# Patient Record
Sex: Female | Born: 1940
Health system: Southern US, Community
[De-identification: ages and names within clinical notes are randomized; demographics above are authoritative.]

## PROBLEM LIST (undated history)

## (undated) DIAGNOSIS — I10 Essential (primary) hypertension: Secondary | ICD-10-CM

## (undated) DIAGNOSIS — Z9889 Other specified postprocedural states: Secondary | ICD-10-CM

## (undated) DIAGNOSIS — I499 Cardiac arrhythmia, unspecified: Secondary | ICD-10-CM

## (undated) DIAGNOSIS — N3000 Acute cystitis without hematuria: Secondary | ICD-10-CM

## (undated) DIAGNOSIS — R509 Fever, unspecified: Secondary | ICD-10-CM

## (undated) DIAGNOSIS — J188 Other pneumonia, unspecified organism: Secondary | ICD-10-CM

## (undated) DIAGNOSIS — E785 Hyperlipidemia, unspecified: Secondary | ICD-10-CM

## (undated) DIAGNOSIS — M1990 Unspecified osteoarthritis, unspecified site: Secondary | ICD-10-CM

## (undated) DIAGNOSIS — R112 Nausea with vomiting, unspecified: Secondary | ICD-10-CM

## (undated) DIAGNOSIS — R7303 Prediabetes: Secondary | ICD-10-CM

## (undated) DIAGNOSIS — G709 Myoneural disorder, unspecified: Secondary | ICD-10-CM

## (undated) DIAGNOSIS — I639 Cerebral infarction, unspecified: Secondary | ICD-10-CM

## (undated) DIAGNOSIS — J189 Pneumonia, unspecified organism: Secondary | ICD-10-CM

## (undated) DIAGNOSIS — I5031 Acute diastolic (congestive) heart failure: Secondary | ICD-10-CM

## (undated) DIAGNOSIS — G839 Paralytic syndrome, unspecified: Secondary | ICD-10-CM

## (undated) DIAGNOSIS — R0600 Dyspnea, unspecified: Secondary | ICD-10-CM

## (undated) DIAGNOSIS — I4819 Other persistent atrial fibrillation: Secondary | ICD-10-CM

## (undated) DIAGNOSIS — M8580 Other specified disorders of bone density and structure, unspecified site: Secondary | ICD-10-CM

## (undated) HISTORY — PX: HERNIA REPAIR: SHX51

## (undated) HISTORY — PX: APPENDECTOMY: SHX54

## (undated) HISTORY — DX: Unspecified osteoarthritis, unspecified site: M19.90

## (undated) HISTORY — DX: Hyperlipidemia, unspecified: E78.5

## (undated) HISTORY — DX: Essential (primary) hypertension: I10

## (undated) HISTORY — PX: JOINT REPLACEMENT: SHX530

## (undated) HISTORY — DX: Other specified disorders of bone density and structure, unspecified site: M85.80

## (undated) HISTORY — PX: CHOLECYSTECTOMY: SHX55

## (undated) HISTORY — PX: ANKLE SURGERY: SHX546

---

## 1998-01-29 ENCOUNTER — Encounter: Admission: RE | Admit: 1998-01-29 | Discharge: 1998-01-29 | Payer: Self-pay | Admitting: Family Medicine

## 1998-06-25 ENCOUNTER — Encounter: Admission: RE | Admit: 1998-06-25 | Discharge: 1998-06-25 | Payer: Self-pay | Admitting: Family Medicine

## 1998-10-14 ENCOUNTER — Encounter: Admission: RE | Admit: 1998-10-14 | Discharge: 1998-11-05 | Payer: Self-pay | Admitting: Family Medicine

## 1999-02-02 ENCOUNTER — Other Ambulatory Visit: Admission: RE | Admit: 1999-02-02 | Discharge: 1999-02-02 | Payer: Self-pay | Admitting: Obstetrics and Gynecology

## 1999-06-20 ENCOUNTER — Encounter: Admission: RE | Admit: 1999-06-20 | Discharge: 1999-07-18 | Payer: Self-pay | Admitting: Sports Medicine

## 1999-06-24 ENCOUNTER — Encounter: Admission: RE | Admit: 1999-06-24 | Discharge: 1999-06-24 | Payer: Self-pay | Admitting: Sports Medicine

## 1999-07-11 ENCOUNTER — Encounter: Admission: RE | Admit: 1999-07-11 | Discharge: 1999-07-11 | Payer: Self-pay | Admitting: Family Medicine

## 1999-08-09 ENCOUNTER — Encounter: Admission: RE | Admit: 1999-08-09 | Discharge: 1999-08-09 | Payer: Self-pay | Admitting: Sports Medicine

## 2000-01-19 ENCOUNTER — Encounter: Admission: RE | Admit: 2000-01-19 | Discharge: 2000-01-19 | Payer: Self-pay | Admitting: Family Medicine

## 2000-05-25 ENCOUNTER — Other Ambulatory Visit: Admission: RE | Admit: 2000-05-25 | Discharge: 2000-05-25 | Payer: Self-pay | Admitting: Obstetrics and Gynecology

## 2000-08-14 ENCOUNTER — Encounter: Admission: RE | Admit: 2000-08-14 | Discharge: 2000-09-14 | Payer: Self-pay | Admitting: Sports Medicine

## 2000-10-18 ENCOUNTER — Encounter: Payer: Self-pay | Admitting: Orthopedic Surgery

## 2000-10-24 ENCOUNTER — Inpatient Hospital Stay (HOSPITAL_COMMUNITY): Admission: RE | Admit: 2000-10-24 | Discharge: 2000-10-26 | Payer: Self-pay | Admitting: Orthopedic Surgery

## 2000-10-24 ENCOUNTER — Encounter: Payer: Self-pay | Admitting: Orthopedic Surgery

## 2000-11-05 ENCOUNTER — Encounter: Admission: RE | Admit: 2000-11-05 | Discharge: 2000-12-11 | Payer: Self-pay | Admitting: Orthopedic Surgery

## 2000-11-13 ENCOUNTER — Encounter: Admission: RE | Admit: 2000-11-13 | Discharge: 2000-11-13 | Payer: Self-pay | Admitting: Family Medicine

## 2001-05-07 ENCOUNTER — Encounter: Admission: RE | Admit: 2001-05-07 | Discharge: 2001-05-13 | Payer: Self-pay | Admitting: Family Medicine

## 2001-06-27 ENCOUNTER — Other Ambulatory Visit: Admission: RE | Admit: 2001-06-27 | Discharge: 2001-06-27 | Payer: Self-pay | Admitting: Obstetrics and Gynecology

## 2001-09-26 ENCOUNTER — Encounter: Admission: RE | Admit: 2001-09-26 | Discharge: 2001-09-26 | Payer: Self-pay | Admitting: Family Medicine

## 2001-09-30 ENCOUNTER — Encounter: Admission: RE | Admit: 2001-09-30 | Discharge: 2001-09-30 | Payer: Self-pay | Admitting: Family Medicine

## 2001-10-01 ENCOUNTER — Encounter: Admission: RE | Admit: 2001-10-01 | Discharge: 2001-11-19 | Payer: Self-pay | Admitting: Family Medicine

## 2002-07-01 ENCOUNTER — Other Ambulatory Visit: Admission: RE | Admit: 2002-07-01 | Discharge: 2002-07-01 | Payer: Self-pay | Admitting: Obstetrics and Gynecology

## 2002-07-04 ENCOUNTER — Encounter: Admission: RE | Admit: 2002-07-04 | Discharge: 2002-07-04 | Payer: Self-pay | Admitting: Family Medicine

## 2002-08-11 ENCOUNTER — Encounter: Admission: RE | Admit: 2002-08-11 | Discharge: 2002-08-11 | Payer: Self-pay | Admitting: Family Medicine

## 2003-01-28 ENCOUNTER — Encounter: Admission: RE | Admit: 2003-01-28 | Discharge: 2003-02-23 | Payer: Self-pay | Admitting: Orthopedic Surgery

## 2003-04-22 ENCOUNTER — Ambulatory Visit (HOSPITAL_COMMUNITY): Admission: RE | Admit: 2003-04-22 | Discharge: 2003-04-22 | Payer: Self-pay | Admitting: Gastroenterology

## 2003-04-28 ENCOUNTER — Ambulatory Visit (HOSPITAL_COMMUNITY): Admission: RE | Admit: 2003-04-28 | Discharge: 2003-04-28 | Payer: Self-pay | Admitting: Gastroenterology

## 2003-05-01 ENCOUNTER — Encounter: Admission: RE | Admit: 2003-05-01 | Discharge: 2003-05-01 | Payer: Self-pay | Admitting: Gastroenterology

## 2003-05-01 ENCOUNTER — Encounter: Payer: Self-pay | Admitting: Gastroenterology

## 2003-07-15 ENCOUNTER — Other Ambulatory Visit: Admission: RE | Admit: 2003-07-15 | Discharge: 2003-07-15 | Payer: Self-pay | Admitting: Obstetrics and Gynecology

## 2003-08-05 ENCOUNTER — Inpatient Hospital Stay (HOSPITAL_COMMUNITY): Admission: RE | Admit: 2003-08-05 | Discharge: 2003-08-08 | Payer: Self-pay | Admitting: Surgery

## 2004-05-31 ENCOUNTER — Encounter: Admission: RE | Admit: 2004-05-31 | Discharge: 2004-05-31 | Payer: Self-pay | Admitting: Orthopedic Surgery

## 2004-07-27 ENCOUNTER — Ambulatory Visit (HOSPITAL_BASED_OUTPATIENT_CLINIC_OR_DEPARTMENT_OTHER): Admission: RE | Admit: 2004-07-27 | Discharge: 2004-07-27 | Payer: Self-pay | Admitting: Orthopedic Surgery

## 2004-08-03 ENCOUNTER — Encounter: Admission: RE | Admit: 2004-08-03 | Discharge: 2004-08-29 | Payer: Self-pay | Admitting: Orthopedic Surgery

## 2004-10-21 ENCOUNTER — Encounter (INDEPENDENT_AMBULATORY_CARE_PROVIDER_SITE_OTHER): Payer: Self-pay | Admitting: Specialist

## 2004-10-21 ENCOUNTER — Inpatient Hospital Stay (HOSPITAL_COMMUNITY): Admission: EM | Admit: 2004-10-21 | Discharge: 2004-10-22 | Payer: Self-pay | Admitting: *Deleted

## 2004-11-28 ENCOUNTER — Other Ambulatory Visit: Admission: RE | Admit: 2004-11-28 | Discharge: 2004-11-28 | Payer: Self-pay | Admitting: Obstetrics and Gynecology

## 2005-06-28 ENCOUNTER — Encounter (INDEPENDENT_AMBULATORY_CARE_PROVIDER_SITE_OTHER): Payer: Self-pay | Admitting: *Deleted

## 2005-09-05 ENCOUNTER — Ambulatory Visit: Payer: Self-pay | Admitting: Sports Medicine

## 2005-09-12 ENCOUNTER — Ambulatory Visit (HOSPITAL_COMMUNITY): Admission: RE | Admit: 2005-09-12 | Discharge: 2005-09-12 | Payer: Self-pay | Admitting: Family Medicine

## 2005-09-12 ENCOUNTER — Ambulatory Visit: Payer: Self-pay | Admitting: Family Medicine

## 2005-10-19 ENCOUNTER — Ambulatory Visit: Payer: Self-pay | Admitting: Family Medicine

## 2005-10-23 ENCOUNTER — Ambulatory Visit: Payer: Self-pay | Admitting: Family Medicine

## 2005-10-23 ENCOUNTER — Inpatient Hospital Stay (HOSPITAL_COMMUNITY): Admission: RE | Admit: 2005-10-23 | Discharge: 2005-10-27 | Payer: Self-pay | Admitting: Orthopedic Surgery

## 2005-11-20 ENCOUNTER — Encounter: Admission: RE | Admit: 2005-11-20 | Discharge: 2006-01-19 | Payer: Self-pay | Admitting: Orthopedic Surgery

## 2006-01-15 ENCOUNTER — Encounter (INDEPENDENT_AMBULATORY_CARE_PROVIDER_SITE_OTHER): Payer: Self-pay | Admitting: *Deleted

## 2006-01-16 ENCOUNTER — Inpatient Hospital Stay (HOSPITAL_COMMUNITY): Admission: RE | Admit: 2006-01-16 | Discharge: 2006-01-17 | Payer: Self-pay | Admitting: Surgery

## 2006-02-15 ENCOUNTER — Ambulatory Visit: Payer: Self-pay | Admitting: Sports Medicine

## 2006-02-21 ENCOUNTER — Ambulatory Visit (HOSPITAL_COMMUNITY): Admission: RE | Admit: 2006-02-21 | Discharge: 2006-02-21 | Payer: Self-pay | Admitting: Family Medicine

## 2006-05-01 ENCOUNTER — Encounter: Admission: RE | Admit: 2006-05-01 | Discharge: 2006-05-16 | Payer: Self-pay | Admitting: Orthopedic Surgery

## 2006-06-08 ENCOUNTER — Ambulatory Visit: Payer: Self-pay | Admitting: Family Medicine

## 2006-07-02 ENCOUNTER — Ambulatory Visit: Payer: Self-pay | Admitting: Family Medicine

## 2006-08-01 ENCOUNTER — Encounter: Admission: RE | Admit: 2006-08-01 | Discharge: 2006-09-03 | Payer: Self-pay | Admitting: Orthopedic Surgery

## 2006-08-02 ENCOUNTER — Ambulatory Visit: Payer: Self-pay | Admitting: Sports Medicine

## 2006-10-08 ENCOUNTER — Encounter: Admission: RE | Admit: 2006-10-08 | Discharge: 2006-10-08 | Payer: Self-pay | Admitting: Orthopedic Surgery

## 2006-10-25 DIAGNOSIS — K219 Gastro-esophageal reflux disease without esophagitis: Secondary | ICD-10-CM

## 2006-10-25 DIAGNOSIS — K5730 Diverticulosis of large intestine without perforation or abscess without bleeding: Secondary | ICD-10-CM | POA: Insufficient documentation

## 2006-10-25 DIAGNOSIS — E7800 Pure hypercholesterolemia, unspecified: Secondary | ICD-10-CM | POA: Insufficient documentation

## 2006-10-25 DIAGNOSIS — M1990 Unspecified osteoarthritis, unspecified site: Secondary | ICD-10-CM

## 2006-10-25 DIAGNOSIS — M5382 Other specified dorsopathies, cervical region: Secondary | ICD-10-CM | POA: Insufficient documentation

## 2006-10-26 ENCOUNTER — Encounter (INDEPENDENT_AMBULATORY_CARE_PROVIDER_SITE_OTHER): Payer: Self-pay | Admitting: *Deleted

## 2006-10-31 ENCOUNTER — Ambulatory Visit (HOSPITAL_BASED_OUTPATIENT_CLINIC_OR_DEPARTMENT_OTHER): Admission: RE | Admit: 2006-10-31 | Discharge: 2006-11-01 | Payer: Self-pay | Admitting: Orthopedic Surgery

## 2006-11-07 ENCOUNTER — Encounter: Payer: Self-pay | Admitting: Family Medicine

## 2006-11-07 ENCOUNTER — Encounter: Admission: RE | Admit: 2006-11-07 | Discharge: 2007-01-30 | Payer: Self-pay | Admitting: Orthopedic Surgery

## 2006-12-11 ENCOUNTER — Encounter: Payer: Self-pay | Admitting: Family Medicine

## 2007-02-26 ENCOUNTER — Ambulatory Visit: Payer: Self-pay | Admitting: Family Medicine

## 2007-06-07 ENCOUNTER — Ambulatory Visit: Payer: Self-pay | Admitting: Family Medicine

## 2007-06-07 ENCOUNTER — Encounter: Payer: Self-pay | Admitting: Family Medicine

## 2007-07-12 ENCOUNTER — Ambulatory Visit: Payer: Self-pay | Admitting: Family Medicine

## 2007-07-17 ENCOUNTER — Ambulatory Visit: Payer: Self-pay | Admitting: Family Medicine

## 2007-07-17 ENCOUNTER — Encounter: Payer: Self-pay | Admitting: Family Medicine

## 2007-07-23 LAB — CONVERTED CEMR LAB
Alkaline Phosphatase: 66 units/L (ref 39–117)
CO2: 22 meq/L (ref 19–32)
Cholesterol: 199 mg/dL (ref 0–200)
Glucose, Bld: 115 mg/dL — ABNORMAL HIGH (ref 70–99)
HDL: 68 mg/dL (ref >39)
LDL Cholesterol: 112 mg/dL — ABNORMAL HIGH (ref 0–99)
Potassium: 4.7 meq/L (ref 3.5–5.3)
Sodium: 137 meq/L (ref 135–145)
Total Bilirubin: 0.5 mg/dL (ref 0.3–1.2)
Total Protein: 7.3 g/dL (ref 6.0–8.3)
Triglycerides: 93 mg/dL (ref <150)

## 2007-08-27 ENCOUNTER — Encounter: Admission: RE | Admit: 2007-08-27 | Discharge: 2007-08-27 | Payer: Self-pay | Admitting: Orthopedic Surgery

## 2007-11-15 ENCOUNTER — Encounter: Payer: Self-pay | Admitting: Family Medicine

## 2007-11-19 ENCOUNTER — Telehealth: Payer: Self-pay | Admitting: Family Medicine

## 2007-11-19 DIAGNOSIS — E559 Vitamin D deficiency, unspecified: Secondary | ICD-10-CM | POA: Insufficient documentation

## 2007-11-19 DIAGNOSIS — I10 Essential (primary) hypertension: Secondary | ICD-10-CM | POA: Insufficient documentation

## 2007-11-21 ENCOUNTER — Encounter: Payer: Self-pay | Admitting: Family Medicine

## 2007-11-21 ENCOUNTER — Ambulatory Visit: Payer: Self-pay | Admitting: Family Medicine

## 2007-11-21 LAB — CONVERTED CEMR LAB: Vit D, 1,25-Dihydroxy: 44 (ref 30–89)

## 2007-12-18 ENCOUNTER — Encounter
Admission: RE | Admit: 2007-12-18 | Discharge: 2008-03-10 | Payer: Self-pay | Admitting: Physical Medicine and Rehabilitation

## 2008-01-22 ENCOUNTER — Encounter: Payer: Self-pay | Admitting: Family Medicine

## 2008-05-27 ENCOUNTER — Ambulatory Visit: Payer: Self-pay | Admitting: Family Medicine

## 2008-06-29 ENCOUNTER — Encounter: Payer: Self-pay | Admitting: Family Medicine

## 2008-07-07 ENCOUNTER — Encounter: Payer: Self-pay | Admitting: Family Medicine

## 2008-08-31 ENCOUNTER — Encounter: Payer: Self-pay | Admitting: Family Medicine

## 2008-10-14 ENCOUNTER — Telehealth: Payer: Self-pay | Admitting: Family Medicine

## 2009-01-07 ENCOUNTER — Encounter: Admission: RE | Admit: 2009-01-07 | Discharge: 2009-02-04 | Payer: Self-pay | Admitting: Orthopaedic Surgery

## 2009-01-21 ENCOUNTER — Telehealth: Payer: Self-pay | Admitting: *Deleted

## 2009-01-21 DIAGNOSIS — M949 Disorder of cartilage, unspecified: Secondary | ICD-10-CM

## 2009-01-21 DIAGNOSIS — M899 Disorder of bone, unspecified: Secondary | ICD-10-CM | POA: Insufficient documentation

## 2009-01-29 ENCOUNTER — Encounter: Admission: RE | Admit: 2009-01-29 | Discharge: 2009-01-29 | Payer: Self-pay | Admitting: Family Medicine

## 2009-02-08 ENCOUNTER — Telehealth: Payer: Self-pay | Admitting: Family Medicine

## 2009-02-08 ENCOUNTER — Encounter: Payer: Self-pay | Admitting: Family Medicine

## 2009-02-24 ENCOUNTER — Ambulatory Visit: Payer: Self-pay | Admitting: Family Medicine

## 2009-02-24 LAB — CONVERTED CEMR LAB
ALT: 33 units/L (ref 0–35)
BUN: 15 mg/dL (ref 6–23)
CO2: 27 meq/L (ref 19–32)
Creatinine, Ser: 0.66 mg/dL (ref 0.40–1.20)
HDL: 77 mg/dL (ref >39)
LDL Cholesterol: 83 mg/dL (ref 0–99)
Total Bilirubin: 0.4 mg/dL (ref 0.3–1.2)
Triglycerides: 137 mg/dL (ref <150)

## 2009-05-05 ENCOUNTER — Encounter: Payer: Self-pay | Admitting: Family Medicine

## 2009-06-14 ENCOUNTER — Encounter
Admission: RE | Admit: 2009-06-14 | Discharge: 2009-08-26 | Payer: Self-pay | Admitting: Physical Medicine and Rehabilitation

## 2009-08-26 ENCOUNTER — Encounter
Admission: RE | Admit: 2009-08-26 | Discharge: 2009-10-26 | Payer: Self-pay | Admitting: Physical Medicine and Rehabilitation

## 2009-12-31 ENCOUNTER — Telehealth: Payer: Self-pay | Admitting: Family Medicine

## 2010-01-03 ENCOUNTER — Encounter: Payer: Self-pay | Admitting: Family Medicine

## 2010-02-22 ENCOUNTER — Encounter: Payer: Self-pay | Admitting: Family Medicine

## 2010-02-22 ENCOUNTER — Ambulatory Visit: Payer: Self-pay | Admitting: Family Medicine

## 2010-02-23 LAB — CONVERTED CEMR LAB
BUN: 10 mg/dL (ref 6–23)
CO2: 25 meq/L (ref 19–32)
Chloride: 101 meq/L (ref 96–112)
Cholesterol: 183 mg/dL (ref 0–200)
Creatinine, Ser: 0.47 mg/dL (ref 0.40–1.20)
Glucose, Bld: 129 mg/dL — ABNORMAL HIGH (ref 70–99)
HDL: 60 mg/dL (ref >39)
LDL Cholesterol: 91 mg/dL (ref 0–99)
MCHC: 33.9 g/dL (ref 30.0–36.0)
MCV: 102 fL — ABNORMAL HIGH (ref 78.0–100.0)
Potassium: 3.9 meq/L (ref 3.5–5.3)
RBC: 4.6 M/uL (ref 3.87–5.11)
Sodium: 139 meq/L (ref 135–145)
TSH: 1.003 microintl units/mL (ref 0.350–4.500)
Total Bilirubin: 0.4 mg/dL (ref 0.3–1.2)
Total CHOL/HDL Ratio: 3.1
Triglycerides: 161 mg/dL — ABNORMAL HIGH (ref <150)
VLDL: 32 mg/dL (ref 0–40)
Vit D, 25-Hydroxy: 38 ng/mL (ref 30–89)
WBC: 4.2 10*3/uL (ref 4.0–10.5)

## 2010-03-04 ENCOUNTER — Ambulatory Visit: Payer: Self-pay | Admitting: Family Medicine

## 2010-03-04 DIAGNOSIS — E118 Type 2 diabetes mellitus with unspecified complications: Secondary | ICD-10-CM

## 2010-03-14 ENCOUNTER — Encounter: Payer: Self-pay | Admitting: Family Medicine

## 2010-03-14 DIAGNOSIS — E212 Other hyperparathyroidism: Secondary | ICD-10-CM

## 2010-04-12 ENCOUNTER — Encounter: Payer: Self-pay | Admitting: Family Medicine

## 2010-04-18 ENCOUNTER — Encounter (HOSPITAL_COMMUNITY): Admission: RE | Admit: 2010-04-18 | Discharge: 2010-05-24 | Payer: Self-pay | Admitting: Family Medicine

## 2010-04-20 ENCOUNTER — Telehealth: Payer: Self-pay | Admitting: Family Medicine

## 2010-05-17 ENCOUNTER — Encounter: Payer: Self-pay | Admitting: Family Medicine

## 2010-05-30 ENCOUNTER — Encounter: Payer: Self-pay | Admitting: Family Medicine

## 2010-06-03 ENCOUNTER — Encounter: Payer: Self-pay | Admitting: *Deleted

## 2010-06-09 ENCOUNTER — Encounter: Payer: Self-pay | Admitting: Family Medicine

## 2010-06-13 ENCOUNTER — Telehealth: Payer: Self-pay | Admitting: *Deleted

## 2010-07-15 ENCOUNTER — Ambulatory Visit (HOSPITAL_COMMUNITY)
Admission: RE | Admit: 2010-07-15 | Discharge: 2010-07-16 | Payer: Self-pay | Source: Home / Self Care | Admitting: Surgery

## 2010-07-15 ENCOUNTER — Encounter (INDEPENDENT_AMBULATORY_CARE_PROVIDER_SITE_OTHER): Payer: Self-pay | Admitting: Surgery

## 2010-08-03 ENCOUNTER — Encounter: Payer: Self-pay | Admitting: Family Medicine

## 2010-08-28 HISTORY — PX: MASTECTOMY PARTIAL / LUMPECTOMY: SUR851

## 2010-09-17 ENCOUNTER — Encounter: Payer: Self-pay | Admitting: Surgery

## 2010-09-18 ENCOUNTER — Encounter: Payer: Self-pay | Admitting: Gastroenterology

## 2010-09-27 NOTE — Letter (Signed)
Summary: Wellness Visit Letter  [facility]  [address]   [place], [place] [address]   Phone: [phone]  Fax: [phone]    01/03/2010  Karen Jackson [address] [place], [place]  [address]  Dear Karen Jackson,  We are happy to let you know that since you are covered under Medicare you are able to have a FREE visit at the [facility] to discuss your HEALTH. This is a new benefit for Medicare.  There will be no co-payment.  At this visit you will meet with Luretha Murphy an expert in wellness and the nurse practitioner at our clinic.  At this visit we will discuss ways to keep you healthy and feeling well.  This visit will not replace your regular doctor visit and we cannot refill medications.  We may schedule future blood work, give shots if needed, or schedule tests to look for hidden problems.   You will need to plan to be here at least one hour to talk about your medical history, your current status, review all of your medications, and discuss your future plans for your health.  This information will be entered into your record for your doctor to have and review.  If you are interested in staying healthy, this type of visit can help.  Please call the office at: [phone], to schedule a "Medicare Wellness Visit".  The day of the visit you should bring in all of your medications, including any vitamins, herbs, over the counter products you take.  Make a list of all the other doctors that you see, so we know who they are. If you have any other health documents please bring them.  We look forward to helping you stay healthy.  Sincerely,    Luretha Murphy NP  Appended Document: Wellness Visit Letter mailed.

## 2010-09-27 NOTE — Progress Notes (Signed)
Summary: phn msg  Phone Note Call from Patient Call back at Home Phone [phone]   Caller: Patient Summary of Call: muscles in legs and arms hurt for the last 2 weeks  Initial call taken by: De Nurse,  Dec 31, 2009 2:12 PM  Follow-up for Phone Call        has been taking celebrex but says this is muscle soreness not joints she takes simvastatin. told her I will send message to pcp & will call with response Follow-up by: Golden Circle RN,  Dec 31, 2009 2:40 PM  Additional Follow-up for Phone Call Additional follow up Details #1::        Has been on simvastatin for years.  Muscle pains for two weeks.  No other obvious cause.  Will stop simvastatin for 2 weeks then restart and see clinical response.  Also has upcoming annual reassessment of medical problems.  Labs ordered in anticipation of that visit. Additional Follow-up by: Doralee Albino MD,  Dec 31, 2009 2:54 PM

## 2010-09-27 NOTE — Progress Notes (Signed)
Summary: phn msg  Phone Note Call from Patient Call back at [phone]   Caller: Patient Summary of Call: pt is out of town and left her cell # to call her. Initial call taken by: De Nurse,  April 20, 2010 11:15 AM  Follow-up for Phone Call        Called and discussed.  Will make surgical referral for removal of parathyroid adenoma.  Also, cannot take simvastatin.  Will make an appointment to see me to discuss other ways to lower cholesterol Follow-up by: Doralee Albino MD,  April 20, 2010 11:34 AM   New Allergies: SIMVASTATIN (SIMVASTATIN) New Allergies: SIMVASTATIN (SIMVASTATIN)

## 2010-09-27 NOTE — Miscellaneous (Signed)
Summary: ok to schedule scan  Clinical Lists Changes pt called to let us know it is ok to schedule the  parathyroid scan now. order is in future orders. sent to team staff.Golden Circle RN  April 12, 2010 2:41 PM  pt called again. let her know on home [phone] or cell [phone]. I LM for her telling her we will call when we have a date.Golden Circle RN  April 13, 2010 11:16 AM  states her appt is monday at [facility]. her correct cell is [phone].Golden Circle RN  April 13, 2010 11:35 AM

## 2010-09-27 NOTE — Miscellaneous (Signed)
  Clinical Lists Changes Called and told results.  She had a parathyroid scan done 3-4 years ago and was normal.  Will plan to repeat because I am concerned about hyperparathyroidism contributing to her osteoporosis.  She will be out of town much of August.  She will call on return to schedule.  Order entered.  Also I meant to prescribe tramadol at visit to augment her celebrex for pain control. Problems: Added new problem of HYPERPARATHYROIDISM NOS (ICD-252.08) Medications: Added new medication of TRAMADOL HCL 50 MG  TABS (TRAMADOL HCL) one by mouth two times a day as needed arthritis pain - Signed Rx of TRAMADOL HCL 50 MG  TABS (TRAMADOL HCL) one by mouth two times a day as needed arthritis pain;  #60 x 5;  Signed;  Entered by: Doralee Albino MD;  Authorized by: Doralee Albino MD;  Method used: Electronically to [facility]. # [id number]*, [address] [name], [place], [place]  [address], Ph: [phone] or [phone], Fax: [phone] Orders: Added new Test order of Nuclear Medicine (Nuclear Medicine) - Signed    Prescriptions: TRAMADOL HCL 50 MG  TABS (TRAMADOL HCL) one by mouth two times a day as needed arthritis pain  #60 x 5   Entered and Authorized by:   Doralee Albino MD   Signed by:   Doralee Albino MD on 03/14/2010   Method used:   Electronically to        [facility]. # [id number]* (retail)       [address]       [place], [place]  [address]       Ph: [phone] or [phone]       Fax: [phone]   RxID:   [id number]

## 2010-09-27 NOTE — Assessment & Plan Note (Signed)
Summary: cpe/tlb   Vital Signs:  Patient profile:   70 year old female Height:      58 inches Weight:      178.3 pounds BMI:     37.40 Temp:     98.0 degrees F oral Pulse rate:   71 / minute BP sitting:   158 / 89  (left arm) Cuff size:   regular  Serial Vital Signs/Assessments:  Time      Position  BP       Pulse  Resp  Temp     By 138                 138/80                         Gladstone Pih  Comments: 138 re checked manually By: Gladstone Pih   CC: CPE Is Patient Diabetic? No Pain Assessment Patient in pain? no        CC:  CPE.  History of Present Illness: Had muscle pain, stopped simvastatin for 1 month.  Pain resolved.  Back on simvastatin and no pain now.    Blood work was fasting.  Had mild high BS at 129.  FHx of DM - mother had in 55s and Valree is almost 68  Recovering OK from death of husband 1.5 years ago.    Multiple joint aches but this is expected.  Habits & Providers  Alcohol-Tobacco-Diet     Alcohol drinks/day: 2     Tobacco Status: quit     Tobacco Counseling: to quit use of tobacco products     Year Quit: 1988  Exercise-Depression-Behavior     Does Patient Exercise: yes     Have you felt down or hopeless? no     Have you felt little pleasure in things? no  Current Medications (verified): 1)  Simvastatin 20 Mg Tabs (Simvastatin) .... Take 1 Tablet By Mouth Every Night 2)  Celebrex 200 Mg  Caps (Celecoxib) .... One Tab Daily 3)  D3-50 50000 Unit  Caps (Cholecalciferol) .... One Tab Weekly 4)  Hydrochlorothiazide 25 Mg  Tabs (Hydrochlorothiazide) .... Take 1 Tab By Mouth Every Morning 5)  Alendronate Sodium 70 Mg Tabs (Alendronate Sodium) .... One Pill Weekly On Empty Stomach and Fully Upright.  Watch For Worsening Indigestion or Jaw Pain 6)  Omeprazole 20 Mg Tbec (Omeprazole) .... One By Mouth Daily Otc  Allergies (verified): 1)  Codeine Phosphate (Codeine Phosphate)  Past History:  Past medical, surgical, family and social  histories (including risk factors) reviewed, and no changes noted (except as noted below).  Past Medical History: Reviewed history from 10/25/2006 and no changes required. Glucose Intolerance 790.6, received Zostavax 2/07, Takes soy extra for menopausal sx  Past Surgical History: 06/08/06 ldl: 168 direct - 07/04/2006, Appendectomy - 11/18/2004, EGD - 03/22/2005, knee surg 08/00 -, laporoscopic Nissen fundoplication - 07/29/2003, Lt knee surg - 11/02/2000, parathyroid scan nl - 02/23/2006, suprapubic bladder suspension 1989 -, UGI endo - 04/27/2003  Right ankle replacement 09/2008 Dr. Sherlean Foot is ortho Dr. Alvester Morin is ortho for back  Family History: Reviewed history from 10/25/2006 and no changes required. - CVA, CAD, + Dementia, DM, CA  Social History: Reviewed history from 02/24/2009 and no changes required. non smoker; 1-2 drinks (wine) per day; erercise plays golf and uses health room; diet fairly healthy  Also, swimming aerobics 3x/week  Had to quit tennis husband died spring 2010 of sudden cardiac deathDoes Patient Exercise:  yes  Review of Systems  The patient denies hoarseness, chest pain, syncope, dyspnea on exertion, peripheral edema, abdominal pain, melena, depression, unusual weight change, and abnormal bleeding.    Physical Exam  General:  Well-developed,well-nourished,in no acute distress; alert,appropriate and cooperative throughout examination Head:  Normocephalic and atraumatic without obvious abnormalities. No apparent alopecia or balding. Eyes:  No corneal or conjunctival inflammation noted. EOMI. Perrla. Funduscopic exam benign, without hemorrhages, exudates or papilledema. Vision grossly normal. Mouth:  Oral mucosa and oropharynx without lesions or exudates.  Teeth in good repair. Neck:  No deformities, masses, or tenderness noted. Lungs:  Normal respiratory effort, chest expands symmetrically. Lungs are clear to auscultation, no crackles or wheezes. Heart:  Normal rate and  regular rhythm. S1 and S2 normal without gallop, murmur, click, rub or other extra sounds. Abdomen:  Bowel sounds positive,abdomen soft and non-tender without masses, organomegaly or hernias noted. Extremities:  No clubbing, cyanosis, edema, or deformity noted with normal full range of motion of all joints.   Neurologic:  No cranial nerve deficits noted. Station and gait are normal. Plantar reflexes are down-going bilaterally. DTRs are symmetrical throughout. Sensory, motor and coordinative functions appear intact.   Impression & Recommendations:  Problem # 1:  Preventive Health Care (ICD-V70.0)  Healthy woman who is up to date on recommended testing.  Orders: [facility]  65+ [phone])  Problem # 2:  HYPERGLYCEMIA, FASTING (ICD-790.29) With normal A1C, no intervention Orders: A1C-[facility] ([address]454)  Problem # 3:  HYPERCHOLESTEROLEMIA (ICD-272.0)  Her updated medication list for this problem includes:    Simvastatin 20 Mg Tabs (Simvastatin) .Marland Kitchen... Take 1 tablet by mouth every night  Labs Reviewed: SGOT: 32 (02/22/2010)   SGPT: 37 (02/22/2010)   HDL:60 (02/22/2010), 77 (02/24/2009)  LDL:91 (02/22/2010), 83 (02/24/2009)  Chol:183 (02/22/2010), 187 (02/24/2009)  Trig:161 (02/22/2010), 137 (02/24/2009)  Problem # 4:  OSTEOPENIA (ICD-733.90)  Her updated medication list for this problem includes:    D3-50 50000 Unit Caps (Cholecalciferol) ..... One tab weekly    Alendronate Sodium 70 Mg Tabs (Alendronate sodium) ..... One pill weekly on empty stomach and fully upright.  watch for worsening indigestion or jaw pain  Problem # 5:  HYPERCALCEMIA (ICD-275.42) Assessment: Unchanged  Orders: Miscellaneous Lab Charge-[facility] (09811)  Problem # 6:  GASTROESOPHAGEAL REFLUX, NO ESOPHAGITIS (ICD-530.81) Assessment: Unchanged  The following medications were removed from the medication list:    Famotidine 40 Mg Tabs (Famotidine) ..... One by mouth daily (for reflux) Her updated medication list for this  problem includes:    Omeprazole 20 Mg Tbec (Omeprazole) ..... One by mouth daily otc  Problem # 7:  DJD, UNSPECIFIED (ICD-715.90) Assessment: Unchanged  Her updated medication list for this problem includes:    Celebrex 200 Mg Caps (Celecoxib) ..... One tab daily  Complete Medication List: 1)  Simvastatin 20 Mg Tabs (Simvastatin) .... Take 1 tablet by mouth every night 2)  Celebrex 200 Mg Caps (Celecoxib) .... One tab daily 3)  D3-50 50000 Unit Caps (Cholecalciferol) .... One tab weekly 4)  Hydrochlorothiazide 25 Mg Tabs (Hydrochlorothiazide) .... Take 1 tab by mouth every morning 5)  Alendronate Sodium 70 Mg Tabs (Alendronate sodium) .... One pill weekly on empty stomach and fully upright.  watch for worsening indigestion or jaw pain 6)  Omeprazole 20 Mg Tbec (Omeprazole) .... One by mouth daily otc Prescriptions: D3-50 50000 UNIT  CAPS (CHOLECALCIFEROL) one tab weekly  #15 x 3   Entered and Authorized by:   Doralee Albino MD  Signed by:   Doralee Albino MD on 03/04/2010   Method used:   Electronically to        [facility]. # [id number]* (retail)       [address]       [place], [place]  [address]       Ph: [phone] or [phone]       Fax: [phone]   RxID:   [id number] ALENDRONATE SODIUM 70 MG TABS (ALENDRONATE SODIUM) one pill weekly on empty stomach and fully upright.  Watch for worsening indigestion or jaw pain  #15 x 3   Entered and Authorized by:   Doralee Albino MD   Signed by:   Doralee Albino MD on 03/04/2010   Method used:   Electronically to        [facility]. # [id number]* (retail)       [address]       [place], [place]  [address]       Ph: [phone] or [phone]       Fax: [phone]   RxID:   [id number] HYDROCHLOROTHIAZIDE 25 MG  TABS (HYDROCHLOROTHIAZIDE) Take 1 tab by mouth every morning  #90 x 3   Entered and Authorized by:   Doralee Albino MD   Signed by:   Doralee Albino MD on  03/04/2010   Method used:   Electronically to        [facility]. # [id number]* (retail)       [address]       [place], [place]  [address]       Ph: [phone] or [phone]       Fax: [phone]   RxID:   [id number] SIMVASTATIN 20 MG TABS (SIMVASTATIN) Take 1 tablet by mouth every night  #90 x 3   Entered and Authorized by:   Doralee Albino MD   Signed by:   Doralee Albino MD on 03/04/2010   Method used:   Electronically to        [facility]. # [id number]* (retail)       [address]       [place], [place]  [address]       Ph: [phone] or [phone]       Fax: [phone]   RxID:   [id number]     Prevention & Chronic Care Immunizations   Influenza vaccine: Fluvax MCR  (05/28/2008)   Influenza vaccine due: 05/28/2009    Tetanus booster: 02/24/2009: Td   Tetanus booster due: 02/25/2019    Pneumococcal vaccine: Pneumovax  (07/12/2007)   Pneumococcal vaccine due: None    H. zoster vaccine: 09/28/2005: given  Colorectal Screening   Hemoccult: Done.  (11/27/1998)   Hemoccult due: Not Indicated    Colonoscopy: Done.  (08/28/2004)   Colonoscopy due: 08/28/2014  Other Screening   Pap smear: Done.  (06/28/2005)   Pap smear due: Not Indicated    Mammogram: No specific mammographic evidence of malignancy.  No significant changes compared to previous study.  Assessment: BIRADS 1. Location: [facility].    (05/05/2009)   Mammogram due: 04/2010    DXA bone density scan: abnormal  (01/29/2009)   DXA scan due: 01/30/2011    Smoking status: quit  (03/04/2010)  Lipids  Total Cholesterol: 183  (02/22/2010)   LDL: 91  (02/22/2010)   LDL Direct: Not documented   HDL: 60  (02/22/2010)   Triglycerides: 161  (02/22/2010)    SGOT (AST): 32  (02/22/2010)   SGPT (ALT): 37  (02/22/2010)   Alkaline phosphatase: 46  (02/22/2010)   Total bilirubin: 0.4  (02/22/2010)     Lipid flowsheet reviewed?: Yes   Progress toward LDL goal: At goal  Hypertension   Last Blood Pressure: 158 / 89  (03/04/2010)   Serum creatinine: 0.47  (02/22/2010)   Serum potassium 3.9  (02/22/2010)    Hypertension flowsheet reviewed?: Yes   Progress toward BP goal: At goal  Self-Management Support :    Hypertension self-management support: Not documented    Lipid self-management support: Not documented   Laboratory Results   Blood Tests   Date/Time Received: March 04, 2010 9:39 AM  Date/Time Reported: March 04, 2010 9:57 AM   HGBA1C: 5.9%   (Normal Range: Non-Diabetic - 3-6%   Control Diabetic - 6-8%)  Comments: ...........test performed by...........Marland KitchenTerese Door, CMA

## 2010-09-27 NOTE — Miscellaneous (Signed)
Summary: received flu vaccine  Clinical Lists Changes received notification from [facility], [address] that patient received flu vaccine 06/03/2010. Theresia Lo RN  June 03, 2010 4:40 PM  Observations: Added new observation of FLU VAX: Historical (06/03/2010 16:39)      Influenza Immunization History:    Influenza # 1:  Historical (06/03/2010)

## 2010-09-27 NOTE — Progress Notes (Signed)
Summary: re: CT scan/TS  Phone Note Call from Patient Call back at Home Phone [phone]   Caller: Patient Summary of Call: Pt orthopedic Dr. wants her to have an CT of right foot due to they think where they replaced her ankle is cracked.  Would like to have it here and take films back to [facility]. Initial call taken by: Clydell Hakim,  June 13, 2010 4:42 PM  Follow-up for Phone Call        called pt and advised to have her ortho doctor have the CT scheduled, since he is the ordering doctor. His nurse can sched. the CT where ever the pt prefers to go. pt agreed. Follow-up by: Arlyss Repress CMA,,  June 13, 2010 5:20 PM

## 2010-09-27 NOTE — Consult Note (Signed)
Summary: [facility]  [facility]   Imported By: De Nurse 06/23/2010 15:04:22  _____________________________________________________________________  External Attachment:    Type:   Image     Comment:   External Document

## 2010-09-29 NOTE — Consult Note (Signed)
Summary: [facility]   Imported By: De Nurse 08/17/2010 11:23:49  _____________________________________________________________________  External Attachment:    Type:   Image     Comment:   External Document

## 2010-11-08 ENCOUNTER — Encounter: Payer: Self-pay | Admitting: Home Health Services

## 2010-11-08 LAB — BASIC METABOLIC PANEL
CO2: 32 mEq/L (ref 19–32)
Chloride: 99 mEq/L (ref 96–112)
GFR calc Af Amer: >60 mL/min (ref >60)
Potassium: 3.9 mEq/L (ref 3.5–5.1)
Sodium: 139 mEq/L (ref 135–145)

## 2010-11-08 LAB — CBC
Hemoglobin: 15 g/dL (ref 12.0–15.0)
MCH: 36.1 pg — ABNORMAL HIGH (ref 26.0–34.0)
MCV: 102 fL — ABNORMAL HIGH (ref 78.0–100.0)
Platelets: 294 10*3/uL (ref 150–400)
RBC: 4.15 MIL/uL (ref 3.87–5.11)
WBC: 4 10*3/uL (ref 4.0–10.5)

## 2010-11-08 LAB — SURGICAL PCR SCREEN: MRSA, PCR: NEGATIVE

## 2010-11-08 LAB — DIFFERENTIAL
Eosinophils Absolute: 0.1 10*3/uL (ref 0.0–0.7)
Eosinophils Relative: 2 % (ref 0–5)
Lymphocytes Relative: 42 % (ref 12–46)
Lymphs Abs: 1.7 10*3/uL (ref 0.7–4.0)
Monocytes Relative: 14 % — ABNORMAL HIGH (ref 3–12)
Neutrophils Relative %: 42 % — ABNORMAL LOW (ref 43–77)

## 2010-11-08 LAB — URINE MICROSCOPIC-ADD ON

## 2010-11-08 LAB — PROTIME-INR
INR: 0.91 (ref 0.00–1.49)
Prothrombin Time: 12.5 seconds (ref 11.6–15.2)

## 2010-11-08 LAB — URINALYSIS, ROUTINE W REFLEX MICROSCOPIC
Bilirubin Urine: NEGATIVE
Nitrite: NEGATIVE
Specific Gravity, Urine: 1.019 (ref 1.005–1.030)
Urobilinogen, UA: 0.2 mg/dL (ref 0.0–1.0)
pH: 7.5 (ref 5.0–8.0)

## 2010-12-26 ENCOUNTER — Other Ambulatory Visit: Payer: Self-pay | Admitting: Family Medicine

## 2010-12-26 DIAGNOSIS — R920 Mammographic microcalcification found on diagnostic imaging of breast: Secondary | ICD-10-CM

## 2011-01-02 ENCOUNTER — Telehealth: Payer: Self-pay | Admitting: Family Medicine

## 2011-01-02 NOTE — Telephone Encounter (Signed)
Received report of abnormal mammogram.  Called and discussed.  Patient aware and is already scheduled for biopsy tomorrow.

## 2011-01-05 ENCOUNTER — Other Ambulatory Visit: Payer: Self-pay | Admitting: Radiology

## 2011-01-06 ENCOUNTER — Encounter: Payer: Self-pay | Admitting: Family Medicine

## 2011-01-06 DIAGNOSIS — R920 Mammographic microcalcification found on diagnostic imaging of breast: Secondary | ICD-10-CM

## 2011-01-06 NOTE — Progress Notes (Signed)
  Subjective:    Patient ID: Karen Jackson, female    DOB: February 23, 1941, 70 y.o.   MRN: [id number]  HPI Core stereotactic biopsy shows atypical lobular hyperplasia.  Referred to surg for removal.      Review of Systems     Objective:   Physical Exam        Assessment & Plan:

## 2011-01-13 NOTE — H&P (Signed)
Karen Jackson, OZAWA NO.:  [id number]   MEDICAL RECORD NO.:  [id number]          PATIENT TYPE:  INP   LOCATION:  NA                           FACILITY:  [facility]   PHYSICIAN:  Mila Homer. Sherlean Foot, M.D. DATE OF BIRTH:  10-24-1940   DATE OF ADMISSION:  10/23/2005  DATE OF DISCHARGE:                                HISTORY & PHYSICAL   CHIEF COMPLAINT:  Painful right knee.   HISTORY:  A 70 year old female with a several-year history of chronic right  knee pain.  She had knee arthroscopy done in 2005 which revealed  osteoarthritis and torn medial and lateral menisci.  She underwent  orthoscopic debridement at that time with guarded results. She now has pain  worsening despite injections and conservative treatment.  She is now  indicated for a right total knee replacement.   PAST MEDICAL HISTORY:  General health is good.  Hospitalizations have  included:  1.  In 2001 for left unicompartmental knee replacement.  2.  In 2003 for esophageal hernia repair.  3.  In the 1990s for bladder tack.   PAST SURGICAL HISTORY:  As above.   MEDICATIONS:  1.  Celebrex 200 mg daily.  2.  Zocor 10 mg daily.   ALLERGIES:  CODEINE causes nausea.   REVIEW OF SYSTEMS:  A 14-point Review of Systems is positive for hiatal  hernia with repair, glasses, and increased cholesterol.   FAMILY HISTORY:  Positive for mother who is deceased with cancer, father who  is deceased with Alzheimer's.   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  She denies the  use of tobacco. She does drink wine.   PHYSICAL EXAMINATION:  GENERAL:  A 70 year old white female, well-developed,  well-nourished, moderately obese, alert, pleasant, cooperative in marginal  distress secondary to right knee pain.  VITAL SIGNS: Temperature 98.2, pulse 64, respirations 18, blood pressure  142/78.  HEAD: Head is normocephalic.  EYES:  Pupils equal, round, and reactive to light and accommodation.  Extraocular movements intact.  EARS, NOSE, THROAT: Reveals cerumen bilateral ears.  NECK:  Supple, no thyromegaly.  CHEST: Had good expansion.  LUNGS:  Essentially clear.  CARDIAC: Regular rhythm and rate.  Normal S1 and S2.  No discrete murmurs,  rubs, or gallops appreciated.  Pulses 2+ bilaterally and symmetric, no  bruits noted.  ABDOMEN: Obese, soft, nontender.  No masses palpable.  Bowel sounds present.  GENITAL/RECTAL/BREASTS:  Exams not performed and not indicated for principal  procedure.  CNS: Oriented x3.  Cranial nerves II-XII grossly intact.  MUSCULOSKELETAL:  She has range of motion of the right knee from 0 to 115  degrees with crepitant and effusion.  She does have some pseudo-laxity at  the medial joint line.  Calf is supple, nontender.   CLINICAL IMPRESSION:  Osteoarthritis of the right knee.   RECOMMENDATIONS:  At this time we feel that she is a candidate for right  total knee replacement.  The procedure, risks, and benefits  have been fully  explained to her.  She is understanding.  She will follow up in the hospital  on 26 February for surgical intervention.      Oris Drone Petrarca, P.A.-C.    ______________________________  Mila Homer. Sherlean Foot, M.D.    BDP/MEDQ  D:  10/17/2005  T:  10/17/2005  Job:  [id number]

## 2011-01-13 NOTE — Op Note (Signed)
[name], Karen Jackson                   ACCOUNT NO.:  [id number]   MEDICAL RECORD NO.:  [id number]          PATIENT TYPE:  AMB   LOCATION:  DSC                          FACILITY:  [facility]   PHYSICIAN:  Mila Homer. Sherlean Foot, M.D. DATE OF BIRTH:  09-Mar-1941   DATE OF PROCEDURE:  07/27/2004  DATE OF DISCHARGE:                                 OPERATIVE REPORT   PREOPERATIVE DIAGNOSIS:  Right knee osteoarthritis and meniscus tear.   POSTOPERATIVE DIAGNOSIS:  Right knee osteoarthritis and meniscus tear.   OPERATION PERFORMED:  Right knee arthroscopy with partial medial  meniscectomy, partial lateral meniscectomy and extensive chondroplasty of  the lateral compartment with removal of loose bodies as well as  chondroplasty of the patellofemoral compartment.   SURGEON:  Mila Homer. Sherlean Foot, M.D.   ANESTHESIA:  General.   INDICATIONS FOR PROCEDURE:  The patient is a 70 year old white female with  mechanical symptoms, x-ray evidence of osteoarthritis and MRI evidence of  lateral meniscal tearing.  Informed consent was obtained.   DESCRIPTION OF PROCEDURE:  The patient was laid supine and administered  general anesthesia.  The right lower extremity was prepped and draped in the  usual sterile fashion.  Inferolateral and inferomedial portals were created  with a #11 blade, blunt trocar and cannula.  Diagnostic arthroscopy revealed  some grade 2 and 3 changes on the medial facet of the patella, grade 3  changes in the deep trochlea.  The medial compartment showed some minor  grade 1 changes on the medial femoral condyle as well as some small radial  tearing of the posterior horn of the meniscus.  The ACL and the PCL were  normal.  Lateral compartment showed grade 4 chondromalacia with eburnated  bone on the far lateral portions.  Lateral meniscus was macerated and torn.  There were several very, very loose articular cartilage pieces coming off of  the lateral condyle as well.  I inserted the [employer]  shaver, performed a  very, very aggressive chondroplasty, removing all the loose bodies and loose  articular pieces.  I then performed a partial lateral meniscectomy to clean  up the meniscus.  I performed a chondroplasty on the lateral tibial plateau  as well.  I then went back into 10 degrees of flexion in valgus forcible on  the knee and performed a very small partial medial meniscectomy as well.  I  then went into extension and performed a chondroplasty of the medial facet  of the patella and the trochlea.  I then lavaged the knee, revisited the  lateral compartment to ensure that everything was stable and all loose  bodies were removed.  I then evacuated the knee of fluid and instruments,  closed with 4-0 nylon sutures, dressed with Xeroform, dressing sponges,  sterile Webril and Ace wrap.   COMPLICATIONS:  None.   DRAINS:  None.   TOURNIQUET TIME:  None.       SDL/MEDQ  D:  07/27/2004  T:  07/27/2004  Job:  [id number]

## 2011-01-13 NOTE — H&P (Signed)
[place]. [facility]  Patient:    Karen Jackson, Karen Jackson [facility]                      MRN: [id number] Adm. Date:  10/24/00 Attending:  Georgena Spurling, M.D. Dictator:   Jamelle Rushing, P.A.                         History and Physical  DATE OF BIRTH:  [birth year]  CHIEF COMPLAINT:  Left knee pain.  HISTORY OF PRESENT ILLNESS:  The patient is a 70 year old white female with a history of left knee pain since late summer.  The patient was initially evaluated in October for pain, catching, and giving out sensation of her left knee.  She did have an arthroscopic evaluation in December of 2001 which revealed significant worse medial compartment osteoarthritis as initially felt by x-rays.  The patient did not have any significant improvement after the arthroscopy and the pain continues to progress.  She describes the pain as predominantly on the medial aspect of her left knee and down the very proximal aspect of the tibia.  She says that it is a sharp pain with weightbearing activities with no radiation.  She also has a catching sensation and a significant difficulty going up stairs and hills.  She does have swelling in the knee if she is up on it for a long period of time.  She controls the pain with the amount of activity she does and she does not have pain in her knee when she is off her feet for any period of time.  She does not use an assistive device at this time.  ALLERGIES:  CODEINE.  MEDICATIONS: 1. Vioxx 25 mg p.o. q.d. 2. Premarin 0.625 mg p.o. q.d. 3. Provera 2.5 mg p.o. q.d. 4. Prilosec 20 mg p.o. q.d.  PAST MEDICAL HISTORY:  The patient denies any type of medical problems with the exception of the occasional problems with reflux, but as long as she is taking her Prilosec it is well controlled.  The patient specifically denies diabetes, hypertension, thyroid disease, hiatal hernia, peptic ulcer disease, heart disease, asthma.  SOCIAL HISTORY:  The  patient is a 70 year old white, heavyset female who denies any history of smoking.  She does drink a glass or two of wine in the evening.  She is married.  She has one child.  She lives in a one-story house and she is a housewife.  PAST SURGICAL HISTORY:  Bladder surgery in 1989.  Arthroscopy of left knee in 2000 and 2001 by Dr. Wyline Mood.  The patient states that she has only had some nausea with previous anesthesia and otherwise no complications.  Family physician is Santiago Bumpers. Leveda Anna, M.D. at [facility], [phone].  FAMILY HISTORY:  Mother is deceased at age 66 from cancer.  Father is deceased at age 23 from Alzheimers disease.  The patient has two brothers alive, the eldest with Alzheimers, the other one just problems with osteoarthritis.  The patient has one sister alive with only problems of arthritis.  REVIEW OF SYSTEMS:  Positive for either glasses at all times or contact lenses. Occasional problem with reflux, but it is improved with Prilosec.  She has occasional problems with urinary urgency.  Otherwise the review of systems is negative and noncontributory for any other general, sensory, respiratory, cardiac, GI, GU, hematologic, musculoskeletal, neurologic, mental status problems that were not mentioned above.  PHYSICAL EXAMINATION:  VITAL SIGNS:  Height 4 foot 11 inches, weight 162 pounds, pulse 72, respirations 12, temperature 98.8, blood pressure 142/92.  GENERAL:  This is a healthy appearing, short in stature, heavyset white female who ambulates with a slight left-sided limp.  She is unable to get onto the examination table due to her height.  HEENT:  Head is normocephalic, atraumatic, nontender over maxillary or frontal sinuses.  Pupils are equally round and reactive to light and accommodation. Extraocular movements are intact.  Sclerae is not icteric.  Conjunctivae is pink and moist.  Funduscopic examination was not done.  External ears were without  deformities.  Canals patent.  TMs pearly gray and intact.  Gross hearing is intact.  Nasal septum is midline.  Mucous membranes pink and moist, no polyps noted.  Oral buccal mucosa was pink and moist with no lesions. Dentition was in good repair.  Uvula was midline and moved symmetrically with phonation.  The patient was able to swallow without any difficulty.  NECK:  Supple with no palpable lymphadenopathy.  Thyroid gland was nontender. The patient had good range of motion of her cervical spine without any difficulty or tenderness.  CHEST:  Lung sounds were clear and equal bilaterally.  No wheezes, rales, rhonchis, or rubs noted.  HEART:  Regular rate and rhythm with S1 and S2 auscultated.  No murmurs, rubs, or gallops noted.  ABDOMEN:  Round, obese-like, unable to palpate any hepatosplenomegaly.  She had no tenderness to deep palpation or percussion.  Bowel sounds were normoactive throughout.  CVA was nontender to percussion.  EXTREMITIES:  Upper extremities were symmetrically size and shape with good range of motion of her shoulders, elbows, and wrists.  5/5 motor strength in all muscle groups.  Lower extremities; the patient had symmetrical range of motion of right and left hip with 20 to 30 degrees internal and external rotation and full extension and flexion to about 125 degrees with no mechanical symptoms or difficulty.  Right knee had no sign of erythema or ecchymosis.  There was no palpable effusion.  No crepitus on the patella with full extension and flexion back to about 120 degrees.  No valgus or varus laxity.  No anterior or posterior drawer.  Left knee; there was no sign of erythema or ecchymosis.  There were well healed arthroscopic ports.  She had no crepitus on the patella with range of motion which was full extension and about 120 degrees.  She had significant medial joint line tenderness.  There was maybe about 5 degrees laxity with valgus varus stress.  No  anterior or posterior drawer.  The calf was nontender.  Bilateral ankles were symmetrical with good dorsi and plantar flexion.   Peripheral vasculature; carotid pulses 2+, radial pulses 2+, unable to palpate femoral pulses, dorsalis pedis pulses 1+, unable to palpate posterior tibial, and there was lower extremity edema or venous stasis changes noted.  NEUROLOGICAL:  The patient was conscious, alert, and appropriate.  Held an easy conversation with the examiner.  Cranial nerves II-XII intact.  Deep tendon reflexes of the upper and lower extremities were symmetrical.  The patient was grossly intact to light touch sensation from head to toe.  BREASTS: RECTAL: GENITOURINARY: Deferred at this time.  IMPRESSION: 1. Left knee medial compartment osteoarthritis. 2. Gastroesophageal reflux disease.  PLAN:  The patient will be admitted to [facility] on October 24, 2000, under the care of Georgena Spurling, M.D.  The patient will undergo all routine labs and tests for  a planned left partial knee arthroplasty. DD:  10/17/00 TD:  10/17/00 Job: [id number]

## 2011-01-13 NOTE — Consult Note (Signed)
NAME:  Karen Jackson, Karen Jackson NO.:  [id number]   MEDICAL RECORD NO.:  [id number]          PATIENT TYPE:  OIB   LOCATION:  1614                         FACILITY:  [facility]   PHYSICIAN:  Petra Kuba, M.D.    DATE OF BIRTH:  1941/01/12   DATE OF CONSULTATION:  01/15/2006  DATE OF DISCHARGE:                                   CONSULTATION   HISTORY OF PRESENT ILLNESS:  The patient seen at the request of Luretha Murphy, M.D. for sludge or distal stones with moderate obstruction of the  CBD on intraoperative cholangiogram. The patient is well known to my  partner, Bernette Redbird, M.D., for years of GI problems. She underwent  elective lap chole for symptomatic gallstones by Dr. Daphine Deutscher today and the  intraoperative cholangiogram is as follows. Currently, she is doing very  well. Alert and oriented. Just arriving in her room with her husband.   PAST MEDICAL HISTORY:  Pertinent for arthritis, reflux status post surgery,  as well as knee surgery and a recent cholecystectomy. She has also had  bladder surgery and appendectomy.   HOME MEDICATIONS:  Include Celebrex and over-the-counter Silver Centrum,  vitamins, fish oil, anti-oxidants, and occasional Flexeril.   FAMILY HISTORY:  Pertinent for mother with cancer. No other obvious GI  problems in the family.   SOCIAL HISTORY:  She does drink socially. Does not smoke.   ALLERGIES:  CODEINE.   REVIEW OF SYSTEMS:  Negative except as above.   PHYSICAL EXAMINATION:  GENERAL:  The patient looks good.  VITAL SIGNS:  Stable. Afebrile. Not examined today since just had surgery.   LABORATORY DATA:  On the computer reviewed. No recent liver tests on  computer. Intraoperative cholangiogram reviewed. Pertinent per above.   ASSESSMENT:  Positive intraoperative cholangiogram for probably stone and  sludge and at least partial obstruction.   PLAN:  The risks, benefits, and methods of ERCP sphincterotomy and stone  extraction were discussed  with both the patient and her husband. The husband  had undergone 3 for a bout of obstructive jaundice 5 or 6 years ago prior to  his lap chole, so they are well familiar with the procedure. The husband  thinks that there is a chance that Dr. Daphine Deutscher,  based on her labs tomorrow, may want to hold off, which is fine with me if  he clinically thinks that it is in her best interest but I have gone ahead  and tentatively scheduled for 10:30 in case she believes that we need to  proceed and they are in agreement with the above plan.           ______________________________  Petra Kuba, M.D.     MEM/MEDQ  D:  01/15/2006  T:  01/16/2006  Job:  [id number]   cc:   William A. Leveda Anna, M.D.  Fax: [phone]   Bernette Redbird, M.D.  Fax: [phone]   [place] Daphine Deutscher, MD  [address]  [place] [employer]  [place] [address]

## 2011-01-13 NOTE — H&P (Signed)
NAME:  Karen Jackson, Karen Jackson NO.:  [id number]   MEDICAL RECORD NO.:  [id number]          PATIENT TYPE:  EMS   LOCATION:  ED                           FACILITY:  [facility]   PHYSICIAN:  Bernette Redbird, M.D.   DATE OF BIRTH:  [birth year]   DATE OF ADMISSION:  10/20/2004  DATE OF DISCHARGE:                                HISTORY & PHYSICAL   This 70 year old female is being admitted to the hospital because of minimal  hematemesis, fever, leukocytosis, abdominal pain, and vomiting.   Karen Jackson is known to me from previous evaluation for reflux symptoms and was  found at that time to have a large para-esophageal hiatal hernia, which was  repaired surgically a couple of years ago by Dr. Wenda Low.  She now  presents for a several-day prodrome of reflux-type symptoms and then the  abrupt onset of protracted vomiting a couple of hours after eating some  prawns at lunch at a restaurant yesterday; however, her friend, who ate the  same food, did not get sick.  She was sick most of the night and started to  have some minimal hematemesis today, prompting her to call our office, where  she was directed to the emergency room for evaluation.  There, she was  hemodynamically stable.  She has not had any Karen Jackson hematemesis or florid  hematemesis and no orthostatic symptomatology.  She had a high hemoglobin  and stable vital signs.   She underwent endoscopy while in the emergency room, and this showed distal  esophagitis.  Because of her acute symptoms in the presence of fever and  leukocytosis, inpatient management on observation status was felt to be  clinically appropriate until it is clear that the patient's case has evolved  in a benign direction, and she is able to tolerate oral fluids.   ALLERGIES:  CODEINE.   OUTPATIENT MEDICATIONS:  Celebrex.   OPERATIONS:  Arthroscopic knee surgery and the above-mentioned laparoscopic  hiatal hernia repair with Nissen wrap.   CHRONIC MEDICAL  ILLNESSES:  1.  DJD.  2.  GERD.  3.  No known cardiopulmonary disease, diabetes, or hypertension.   FAMILY HISTORY:  Not obtained.   SOCIAL HISTORY:  Married.  Accompanied by her husband.  Travels a fair  amount.   REVIEW OF SYSTEMS:  Recent GERD.   PHYSICAL EXAMINATION:  VITAL SIGNS:  On presentation to the emergency room,  systolic blood pressure 179, diastolic blood pressure 100, pulse 118,  respirations 20, temperature 97.4.  On repeat was 101.5.  GENERAL:  Pertinent for some mild right lower quadrant tenderness.  The  patient is in no acute distress.  HEENT:  Anicteric.  No evident pallor.  No acute distress.  Oropharynx  benign.  CHEST:  Clear.  HEART:  Normal.  ABDOMEN:  Obese.  Soft with some mild right lower quadrant tenderness, not  associated with guarding or rebound.  Overall, a fairly benign abdominal  exam.   LABS:  White count 13,900, hemoglobin 17.2, platelets 346,000, 87% polys, 5%  lymphs, 8% monocytes.  Chemistry panel pertinent for  a BUN of 9.  Minimal  elevation of ALT (45), normal up to 40.  Total bilirubin 1.4.   IMPRESSION:  1.  Nausea and vomiting of approximately 18 hours duration.  2.  Minimal hematemesis with distal esophagitis noted endoscopically.  3.  Esophagitis, etiology unclear (secondary to emesis) versus primary      gastroesophageal reflux disease.  4.  Fever, mild leukocytosis.  5.  Status post laparoscopic Nissen/hiatal hernia repair.  6.  Minimal elevation of liver chemistries.   I think the overall picture is most consistent with viral gastroenteritis,  which account for low-grade fever. I think the white count is more due to  stress than anything.  She is not showing signs of clinically significant  gastrointestinal bleeding.  I think the hematemesis was just some mucosal  hemorrhage from friability of the lower esophagus.   PLAN:  1.  The patient will be followed for her clinical evolution (fever,      tachycardia, abdominal  tenderness, leukocytosis).  2.  Supportive care with IV fluids and Phenergan and PPI's.  3.  Further management will depend on the patient's clinical evolution.      RB/MEDQ  D:  10/20/2004  T:  10/20/2004  Job:  [id number]   cc:   William A. Leveda Anna, M.D.  Fax: [phone]

## 2011-01-13 NOTE — Op Note (Signed)
NAME:  Karen Jackson, Karen Jackson                             ACCOUNT NO.:  [id number]   MEDICAL RECORD NO.:  [id number]                   PATIENT TYPE:  AMB   LOCATION:  ENDO                                 FACILITY:  [facility]   PHYSICIAN:  Bernette Redbird, M.D.                DATE OF BIRTH:  [birth year]   DATE OF PROCEDURE:  04/22/2003  DATE OF DISCHARGE:                                 OPERATIVE REPORT   PROCEDURE:  Upper endoscopy.   INDICATIONS:  A 70 year old female on long term Celebrex but also maintained  on Prilosec OTC.  She recently had several days of melenic stool but when  checked in our office, was clinically stable and had a hemoglobin of 13.3.   FINDINGS:  No source of bleeding evident.   DESCRIPTION OF PROCEDURE:  The nature, purpose and risks of the procedure  were familiar to the patient from prior endoscopy and she provided written  consent.  Sedation was fentanyl 50 mcg and Versed 5 mg IV without  arrhythmias or desaturation.   The Olympus video endoscope was passed under direct vision.  The vocal cords  looked grossly normal.  The esophagus was readily entered and had normal  mucosa without evidence of reflux, esophagitis, Barrett's esophagus,  varices, infection or neoplasia.  No ring, stricture or reflux were noted.  However, the Z-line was at 29 cm and below this was a huge 10 cm hiatal  hernia, with a diaphragmatic hiatus rather indistinct at about 39 cm.  A  good deal of the hiatal hernia lay transversely and I estimate that the  transverse diameter of the hiatal hernia was perhaps 20 cm or so.   Careful inspection of the region of the diaphragmatic hiatus disclosed no  evidence of Cameron lesions.   Within the stomach, there was no residual and in particular, no blood or  coffee ground material.  I saw no evidence of NSAID-induced gastropathy,  specifically no erosions or ulcers.  No polyps or masses were seen including  a retroflexed view of the proximal stomach.   The pylorus, duodenal bulb and  the second duodenum looked normal.   The scope was removed from the patient.  No biopsies were obtained.  She  tolerated the procedure well and there were no apparent complications.   IMPRESSION:  Very large hiatal hernia, otherwise unremarkable exam.  No  blood or coffee ground material present, no bleeding seen, no perspective  bleeding site identified, no NSAID gastropathy.    DISCUSSION:  I wonder whether the patient may have had transient ulceration  at her diaphragmatic hiatus, (Cameron lesions).  It has now been several  days since she had dark stools and perhaps close to a week since she bled.   PLAN:  Okay to resume Celebrex.  Bernette Redbird, M.D.    RB/MEDQ  D:  04/22/2003  T:  04/22/2003  Job:  [id number]   cc:   Malachi Pro. Ambrose Mantle, M.D.  [address]  [address]  [place] [address]  Fax: [phone]   Santiago Bumpers. Hensel, M.D.  [address] [place]  [place] [address]  Fax: [phone]

## 2011-01-13 NOTE — Op Note (Signed)
NAME:  Karen Jackson, Karen Jackson                             ACCOUNT NO.:  [id number]   MEDICAL RECORD NO.:  [id number]                   PATIENT TYPE:  INP   LOCATION:  0363                                 FACILITY:  [facility]   PHYSICIAN:  Thornton Park. Daphine Deutscher, M.D.             DATE OF BIRTH:  Mar 03, 1941   DATE OF PROCEDURE:  08/05/2003  DATE OF DISCHARGE:                                 OPERATIVE REPORT   PREOPERATIVE DIAGNOSIS:  Large hiatal hernia with paraesophageal component.   POSTOPERATIVE DIAGNOSIS:  Large type 3 mixed hiatal hernia.   PROCEDURE:  Laparoscopic Nissan fundal plication (done over #50 lighted  bougie dilator, three-suture wrap, three pledgeted suture closure of  hiatus).   SURGEON:  Thornton Park. Daphine Deutscher, M.D.   ASSISTANT:  Ollen Gross. Carolynne Edouard, M.D.   ANESTHESIA:  General endotracheal.   OPERATIVE TIME:  2.5 hr.   DESCRIPTION OF PROCEDURE:  Ms. Karen Jackson is a 70 year old lady who has been  having some problems with upper GI bleed, which is thought possibly to be  due to Cameron's ulcers (although they have not been identified  endoscopically).  Informed consent was obtained preoperatively regarding  laparoscopic as well as open Nissan fundoplication, and limitations on the  limit to visceral perforation/wrap breakdown failure were explained to her.  The patient was taken back to room #1 and given general anesthesia.  PAS  hose in place.  The abdomen was prepped with Betadine and draped sterilely.   I entered the abdomen using the Optiview technique, using the new Epicon  Excel trocars.  These were inserted sequentially, beginning forward to one  and slightly to left of midline; we entered without difficulty.  We then  insufflated the abdomen and placed the 5 mm in the upper midline, one 10-11  in the left upper quadrant and two in the right upper quadrant.  The 5 mm  squiggly retractor was used to retract the liver.  In exposing the hiatus, I  then grasped the stomach and pulled  down.  Probably about 1/3 to 1/2 of the  stomach was chronically incarcerated up in the chest.  I began by incising  the gastrohepatic window on the patient's right side, and then dissecting  around the hiatus.  Since I was able to reduce the stomach into the abdomen,  I then basically excised the sac as I came around, and found this to be a  very large fatty mass.  When the sac had been divided around from right to  left, it created a big apron of fat on the stomach.  Ultimately I debulked  this with the harmonic, put it in a sac and brought it out through one of  the trocar sites.  As I was able to get around the stomach posteriorly from  the right side, I then completed the takedown of short gastric vessels and  then mobilization of the  greater curvature.  With this being done I had  excellent visualization of the hiatal hernia, and I went ahead and  approximated that with three pledgeted sutures using the Endo stitch  posteriorly up to the esophagus.  I put in a #50 lighted bougie to calibrate  this.  This was snug but not too tight.  With the 50-French bougie in place,  I then created a wrap invaginating the distal esophagus.  I sutured in place  the three interrupted sutures, using the Endo stitch and taking a purchase  of stomach, esophagus and stomach on each one and tying them  intracorporeally.  Clips were placed on the knots for radiographic  visualization.   Below the wrap was a large amount of fat of this apron, that still remained  but had been debulked.  The wrap was up on the esophagus and there was no  evidence of perforations, leaks or bleeding.  I irrigated briefly, went  through the irrigant, looked at all the structures.  I looked at the trocar  sites.  I injected these 0.5% Marcaine and then deflated the abdomen through  the trocars.  The skin was closed with 4-0 Vicryl subcutaneously, with  Benzoin and Steri-Strips applied.  The patient seemed to tolerate the   procedure well, and she was taken to the recovery room in satisfactory  condition.   FINAL DIAGNOSIS:  Large type 3 mixed hiatal hernia, containing approximately  half of the stomach; status post takedown, repair of hiatal hernia and  Nissan fundoplication via the laparoscope.                                               Thornton Park Daphine Deutscher, M.D.    MBM/MEDQ  D:  08/05/2003  T:  08/06/2003  Job:  [id number]   cc:   William A. Hensel, M.D.  [address] [place]  [place] [address]  Fax: [phone]   Bernette Redbird, M.D.  [address] [place]  [place], [place] [address]  Fax: [phone]

## 2011-01-13 NOTE — Op Note (Signed)
Karen Jackson, Karen Jackson NO.:  [id number]   MEDICAL RECORD NO.:  [id number]          PATIENT TYPE:  AMB   LOCATION:                               FACILITY:  [facility]   PHYSICIAN:  Mila Homer. Sherlean Foot, M.D. DATE OF BIRTH:  Oct 12, 1940   DATE OF PROCEDURE:  10/31/2006  DATE OF DISCHARGE:                               OPERATIVE REPORT   SURGEON:  Lucey.   ASSISTANT:  None.   ANESTHESIA:  General.   PREOPERATIVE DIAGNOSIS:  Right shoulder impingement syndrome and  osteoarthritis.   POSTOPERATIVE DIAGNOSIS:  Right shoulder impingement syndrome and  osteoarthritis plus labral tearing.   INDICATIONS FOR PROCEDURE:  The patient is a 70 year old with clinical  and MRI evidence of degenerative arthritis and potential labral tearing  and impingement.   PROCEDURE:  Right shoulder arthroscopy, subacromial decompression,  distal clavicle resection, labral debridement, glenohumeral debridement.   DESCRIPTION OF PROCEDURE:  The patient was lain supine, administered  general anesthesia and placed in beach-chair position.  Right shoulder  was prepped and draped in the usual sterile fashion.   Anterior-posterior direct lateral portals were created with a #11 blade,  blunt trocar and cannula.  Diagnostic arthroscopy revealed grade 4  chondromalacia on the humeral head and the glenoid.  There was complex  tearing of the total circumference of the labrum.  I placed a 3.2 gator  shaver through the anterior portal and performed a debridement of the  labrum, as well as a debridement of the chondromalacia in the joint.  There was no rotator cuff tear.  I then redirected the scope from the  posterior portal into the subacromial space.  From the direct lateral  portal, I performed a subtotal bursectomy.  There was a very large  anterolateral acromial spur as well as AC joint osteophytes.  A 4-mm  cylindrical bur was used to perform a very, very aggressive  decompression.  I then checked  through the direct lateral portal with a  camera and insured the decompression was adequate, which it was.  I then  finished my bursectomy and CA ligament release.  I then obtained  hemostasis, irrigated and closed with 4-0 nylon sutures, dressed with  Xeroform dressing, sponges, ABDs, 2-inch silk tape and a simple sling.   COMPLICATIONS:  None.   DRAINS:  None.          ______________________________  Mila Homer. Sherlean Foot, M.D.    SDL/MEDQ  D:  10/31/2006  T:  10/31/2006  Job:  [id number]

## 2011-01-13 NOTE — Discharge Summary (Signed)
Karen Jackson, MABEE NO.:  [id number]   MEDICAL RECORD NO.:  [id number]          PATIENT TYPE:  OUT   LOCATION:  EKG                          FACILITY:  [facility]   PHYSICIAN:  Legrand Pitts. Duffy, P.A.   DATE OF BIRTH:  1941/04/30   DATE OF ADMISSION:  09/12/2005  DATE OF DISCHARGE:  09/12/2005                                 DISCHARGE SUMMARY   ADMISSION DIAGNOSES:  1.  End-stage osteoarthritis right knee.  2.  Hiatal hernia.  3.  Gastroesophageal reflux disease.   DISCHARGE DIAGNOSES:  1.  End-stage osteoarthritis right knee status post right total knee      arthroplasty.  2.  Acute blood loss anemia secondary to surgery.  3.  Mild hyponatremia, now resolved.  4.  Mild hypokalemia.  5.  Constipation.  6.  Nausea with one episode of hematemesis.  7.  Hiatal hernia.  8.  Gastroesophageal reflux disease.   SURGICAL PROCEDURES:  On October 23, 2005 Karen Jackson underwent a right total  knee arthroplasty by Dr. Mila Homer. Lucey, assisted by Richardean Canal PA-C.  She had a fluted stem tibial component size 3 placed with the femoral  component size C right, and an articular surface size yellow CD 10 mm height  and a NexGen poly patella, size 32-8.5 mm thickness.   COMPLICATIONS:  None.   CONSULTANT:  1.  RN case manager and physical therapy consult October 24, 2005.  2.  Occupational therapy consult October 25, 2005.  3.  Internal medicine consult by family practice teaching service October 26, 2005.   HISTORY OF PRESENT ILLNESS:  This 70 year old white female patient presented  to Karen Jackson with a history of several years of right knee pain. She did  have a knee arthroscopy in 2005 which showed arthritic changes. The pain has  gotten worse and has been unresponsive to conservative treatment. Because of  this and her x-ray findings she is scheduled for a right knee replacement.   HOSPITAL COURSE:  Karen Jackson tolerated her surgical procedure well without  immediate postoperative complications. She was transferred to 5000. Postop  day #1 she was having some problems with itching. Pain was controlled with  Percocet and so her PCA was DC'd. Hemoglobin 11.3, hematocrit 32, sodium  131, potassium 3.8. She started on therapy per protocol.   Postop day #2, she had had problems with nausea, vomiting the day before and  had relatively low p.o. intake. T-max was 101.2. Med's were switched from  Percocet to Vicodin. Potassium was low at 3.4 and that was supplemented with  p.o. potassium. Right knee incision was benign with minimal drainage. She  was continued on therapy. During the night that night she did have 1 episode  of hematemesis. Medical teaching service was called and then they followed  her the rest of hospitalization. They did DC her Celebrex and switched her  to p.o. potassium liquid and started her on Protonix.   On March 1, she has had no more emesis since the night. She did tolerate a  minimal breakfast. T-max 100.8, vitals stable. Hemoglobin 10.1, hematocrit  28.7. Sodium 139, potassium 3.2. She is doing well orthopedically and has  had no further nausea or vomiting. We will give her liquid potassium  supplements today, see how she does with lunch, if she tolerates lunch  without nausea or vomiting she will be DC'd home later today.   DISCHARGE INSTRUCTIONS:  Diet: She can resume her regular pre  hospitalization diet.   MEDICATIONS:  She may resume her pre hospitalization med's except no more  Celebrex. Home med's just included the Celebrex and Zocor 10 milligrams p.o.  q.a.m.   Additional med's at this time include:  1.  Lovenox 40 mg subcu q.a.m. with a last dose to be November 06, 2005.  2.  Norco 5/325 mg 1-2 tablets p.o. q.4h p.r.n. severe pain 30 with no      refill.  3.  Phenergan 25 mg 1/2 to 1 tablet p.o. q.4h p.r.n. for nausea, 25 with no      refill.  4.  Robaxin 500 mg 1-2 tablets p.o. q.6h p.r.n. for spasms, 40 with  no      refill.   She can resume her Prilosec which she reports she does take some times at  home.   ACTIVITY:  She can be out of bed weightbearing as tolerated on the right leg  with the use of a walker. She is to have home CPM 0 to 100 degrees 6 to 8  hours a day and home health PT per [facility]. Please see the  blue total knee discharge sheet for further activity instructions.   WOUND CARE:  She may shower after no drainage from the wound for 2 days.  Please see the blue total knee discharge sheet for further wound care  instructions.   FOLLOWUP:  She needs to followup with Karen Jackson in our office on Tuesday  March 13 and needs to call [phone] for that appointment. She needs a repeat  __________drawn by Genevieve Norlander next week and faxed to our office. We will get a  follow-up __________next week.   LABORATORY DATA:  Hemoglobin/hematocrit have ranged from 15.8 and 45.1 on  February 22 to a low of 10 and 28.4 on February 28. On March 1 it is 10.1  and 28.7.Sodium was 135 on the 22nd,  131 on the 27th and today it is 139.  Glucose's ranged from 90 on the 22nd to 137 on the 27th. Calcium ranged from  10.4 on the 22 to 8.3 on the 27th. Potassium ranged from 4.7 on the 22nd to  3.3 on the 28th to 3.2 on March 1. The BUN and creatinine were 2 and 0.5 on  the 28th. On March 1 it is 4 and 0.6. All other laboratory studies were  within normal limits.      Legrand Pitts Duffy, P.A.     KED/MEDQ  D:  10/26/2005  T:  10/26/2005  Job:  [id number]   cc:   William A. Leveda Anna, M.D.  Fax: [phone]

## 2011-01-13 NOTE — Op Note (Signed)
Karen Jackson, MAHARAJ NO.:  [id number]   MEDICAL RECORD NO.:  [id number]          PATIENT TYPE:  OIB   LOCATION:  1614                         FACILITY:  [facility]   PHYSICIAN:  Thornton Park. Daphine Deutscher, MD  DATE OF BIRTH:  August 23, 1941   DATE OF PROCEDURE:  01/15/2006  DATE OF DISCHARGE:                                 OPERATIVE REPORT   PREOPERATIVE DIAGNOSES:  1.  Chronic cholecystitis.  2.  Cholelithiasis.   POSTOPERATIVE DIAGNOSES:  1.  Chronic cholecystitis.  2.  Evidence of small choledocholelithiasis.   PROCEDURE:  Laparoscopic cholecystectomy, intraoperative cholangiogram  showing distal small stones and sludge with delayed drainage out the distal  common bile duct.   SURGEON:  Thornton Park. Daphine Deutscher, MD   ASSISTANT:  Adolph Pollack, MD   ANESTHESIA:  General endotracheal.   DESCRIPTION OF PROCEDURE:  Judie Petit Hart was taken to room 1 on May 21 and given  general anesthesia.  The abdomen was prepped with chlorhexidine and draped  sterilely.  I made a longitudinal incision down into her umbilicus and I  then used an open Hasson technique without difficulty.  The abdomen was  insufflated and then 3 other trocars were placed in the usual positions.  The gallbladder was grasped and elevated and she had numerous adhesions to  her gallbladder and these were stripped away.  The cystic duct was dissected  free and I put a clip upon the gallbladder and incised the cystic duct and  milked back and I got a lot of black, anthracotic pigment, small tiny  fragments flowing out and I did this until it was clear.  I then inserted a  Reddick catheter and took a dynamic cholangiogram, which filled the common  duct and I put my hand to move the trocar out of the way and was able to see  the distal duct and it looked like there may be some little stone fragments  that were impeding the emptying.  I put the Reddick catheter in and removed  it and there was pressure and I washed back  to lot more pieces of black  stony material.  I was unable to pass the Reddick catheter all the way into  the distal common bile duct.  I felt at this point this was tiny sludge and  I would go ahead and get a GI consult to consider ERCP and we will check  liver functions 1 day postop.  In the meantime, the cystic duct was triple-  clipped and divided, cystic artery was triple-clipped and divided and the  gallbladder was removed from the gallbladder bed.  I did apply some clips  along the lateral margin where there were some bleeders.  Once detached, the  gallbladder was placed a bag.  The gallbladder bed did not show any evidence  of any bleeding or bile leaks.  It was brought out through the umbilicus,  which I then  closed under direct vision with 0 Vicryls.  Wounds were injected with  Marcaine.  I irrigated again.  The wounds were closed with  4-0 Vicryl,  Benzoin and Steri-Strips.  The patient seemed to tolerate the procedure well  and was taken to the recovery room in satisfactory addition.      Thornton Park Daphine Deutscher, MD  Electronically Signed     MBM/MEDQ  D:  01/15/2006  T:  01/16/2006  Job:  [id number]   cc:   William A. Leveda Anna, M.D.  Fax: [phone]   Bernette Redbird, M.D.  Fax: [phone]   Petra Kuba, M.D.  Fax: [phone]

## 2011-01-13 NOTE — Op Note (Signed)
NAMESHEMECA, LUKASIK NO.:  [id number]   MEDICAL RECORD NO.:  [id number]          PATIENT TYPE:  INP   LOCATION:  5039                         FACILITY:  [facility]   PHYSICIAN:  Mila Homer. Sherlean Foot, M.D. DATE OF BIRTH:  August 20, 1941   DATE OF PROCEDURE:  10/23/2005  DATE OF DISCHARGE:                                 OPERATIVE REPORT   PREOPERATIVE DIAGNOSIS:  Right knee osteoarthritis.   POSTOPERATIVE DIAGNOSIS:  Right knee osteoarthritis.   OPERATION PERFORMED:  Right total knee arthroplasty.   SURGEON:  Mila Homer. Sherlean Foot, M.D.   ASSISTANT:  Richardean Canal, P.A.   ANESTHESIA:  General.   INDICATIONS FOR PROCEDURE:  The patient is a 70 year old white female with  failure of conservative measures for osteoarthritis of the knee.  Informed  consent was obtained.   DESCRIPTION OF PROCEDURE:  The patient was laid supine and administered  general anesthesia and preop femoral nerve block.  The right leg was prepped  and draped in the usual sterile fashion.  A #10 blade was used to make a  midline incision.  A fresh blade was used to make a median parapatellar  arthrotomy and perform a synovectomy.  Additionally, I elevated the deep MCL  off the medial crest of the tibia.  I then everted the patella.  It measured  22 mm thick.  I reamed down to 14.5 mm.  I then used a 32 mm template,  drilled three lug holes with the prosthetic trial in place.  I recreated the  22 mm thickness.  I then removed the prosthetic trial and went into flexion.  I used an extramedullary alignment system to make the perpendicular cut to  the anatomic axis of the tibia, removing 10 mm of bone off the lateral side  which was the low side.  I then made an intramedullary drill hole in the  femur and placed a distal femoral cutting block into place and made that cut  with a sagittal saw.  I then drilled out the epicondylar axis.  It measured  3 degrees. I pinned to 3 degree holes and sized to a size C.   I placed the 4  in 1 cutting block into place and made the anterior, posterior and chamfer  cuts with a sagittal saw.  At this point I placed a 10 mm spacer block in  the knee.  I had good flexion and extension gap balance after a slight  lateral release.  I then placed a lamina spreader in the knee removed the  ACL, PCL, medial and lateral menisci and posterior condylar osteophytes.  I  then put the scoop retractor in place and placed the size C finishing block  on the femur and cut the block and drilled the lug holes.  I then removed  the finishing block.  I then used a size 3 tibial tray, drilled and keeled  to prepare the tibia.  I then trialed with a size C femur, size 3 tibia,  size 10 insert and a size 32 patella.  I had good flexion extension  gap  balance, drop and dangle back to 130 degrees.  I removed the prosthetic  trials and copiously irrigated.  I then cemented in the femur and tibia,  removed the excess cement, snapped in the polyethylene and put the knee in  extension.  I then cemented in the patella and removed the excess cement  there as well. When the cement was hardened, I let the tourniquet down.  I  left the Hemovac coming out superolaterally and deep to the arthrotomy.  I  then closed the arthrotomy with interrupted #1 Vicryl suture, deep soft  tissues with interrupted 0 Vicryl sutures and a subcuticular 3-0 Vicryl  stitch.  I then dressed with Xeroform dressing sponges, ABDs, a single layer  of Webril and TED stocking.   COMPLICATIONS:  None.   DRAINS:  One Hemovac.   ESTIMATED BLOOD LOSS:  300 mL.           ______________________________  Mila Homer. Sherlean Foot, M.D.     SDL/MEDQ  D:  10/23/2005  T:  10/24/2005  Job:  [id number]

## 2011-01-13 NOTE — Discharge Summary (Signed)
Karen Jackson, Karen Jackson NO.:  [id number]   MEDICAL RECORD NO.:  [id number]          PATIENT TYPE:  INP   LOCATION:  1614                         FACILITY:  [facility]   PHYSICIAN:  Thornton Park. Daphine Deutscher, MD  DATE OF BIRTH:  07-Nov-1940   DATE OF ADMISSION:  01/15/2006  DATE OF DISCHARGE:  01/17/2006                                 DISCHARGE SUMMARY   PREOP DIAGNOSIS:  Chronic cholecystitis, cholelithiasis.   DISCHARGE DIAGNOSIS:  Chronic cholecystitis with choledocholithiasis.   PROCEDURE:  Laparoscopic cholecystectomy, IOC, with distal common bile duct  stones on cholangiogram, ERCP per Dr. Ewing Schlein.   COURSE IN THE HOSPITAL:  Karen Jackson was admitted on 05/21 and had a  laparoscopic cholecystectomy IOC.  She did well. She was seen by Dr. Ewing Schlein,  postop.  She had an ERCP on 05/22 from which she did well, and was ready for  discharge on 05/23.  Condition good.  She had some hydrocodone at home which  she was planning to take; and she was asked to return to office in 3-4 weeks  for followup.      Thornton Park Daphine Deutscher, MD  Electronically Signed     MBM/MEDQ  D:  02/26/2006  T:  02/26/2006  Job:  [id number]   cc:   Petra Kuba, M.D.  Fax: [phone]   Santiago Bumpers. Leveda Anna, M.D.  Fax: [phone]   Bernette Redbird, M.D.  Fax: [phone]

## 2011-01-13 NOTE — Op Note (Signed)
NAME:  Karen Jackson, Karen Jackson                             ACCOUNT NO.:  [id number]   MEDICAL RECORD NO.:  [id number]                   PATIENT TYPE:  AMB   LOCATION:  ENDO                                 FACILITY:  [facility]   PHYSICIAN:  Bernette Redbird, M.D.                DATE OF BIRTH:  1941-03-02   DATE OF PROCEDURE:  04/28/2003  DATE OF DISCHARGE:                                 OPERATIVE REPORT   PROCEDURE:  Upper endoscopy.   INDICATION:  This 70 year old female is about one week status post a  negative endoscopy, (except for a large hiatal hernia), performed because of  coffee ground emesis and melena.  She did well for the ensuing week, off  Celebrex and on Prilosec, but then two days ago, was awakened in the middle  of the night by recurrent coffee ground emesis on several occasions,  followed by melenic stool which was confirmed on rectal exam in the office  yesterday afternoon.  She remained grossly clinically stable.  It was felt  that a repeat attempt at endoscopy to try to find the bleeding source, this  time closer in time to the occurrence of the bleeding, might be helpful.   FINDINGS:  Erythema at the diaphragmatic hiatus but no definite bleeding  source identified.   DESCRIPTION OF PROCEDURE:  The nature, purpose and risks of the procedure  were familiar to the patient and she provided written consent.  Sedation was  fentanyl 50 mcg and Versed 5 mg IV without arrhythmias or desaturation.  The  Olympus small caliber adult video endoscope was passed under direct vision.  The vocal cords were unremarkable in appearance.   The esophagus was readily entered and was normal in its entirety without  evidence of reflux, esophagitis, Barrett's esophagus, varices, infection or  neoplasia.  There was perhaps a mild esophageal ring at the squamocolumnar  junction.  The patient had a huge hiatal hernia, probably 10 cm or so in  cephalocaudal dimension and probably 15-20 cm transversely.   At the  diaphragmatic hiatus, there was erythema of the gastric mucosa but a careful  inspection disclosed no definite erosive changes, although the mucosal  surface was ever so slightly irregular as though there might be some  resolving erosions present.  The stomach contained no blood or coffee ground  material and the abdominal portion of the stomach was free of erosions,  ulcers, polyps or masses.  A retroflexed view of the proximal stomach and in  particular, the region of the diaphragmatic hiatus again failed to disclose  any definite Cameron's lesions.  The pylorus, duodenal bulb and second  duodenum looked normal.   After careful inspection and re-inspection of the area of the diaphragmatic  hiatus, the scope was removed from the patient who tolerated the procedure  well and without any apparent complication.   IMPRESSION:  1. No definite bleeding site  identified.  2. Very large hiatal hernia with inflammatory changes at the diaphragmatic     hiatus, possibly accounting for the recent bleeding.  No alternative     source of hematemesis or melena was identified on this exam.    PLAN:  Follow up in the office.  Upper GI series to further define the  patient's anatomy.  Consider surgical referral for elective surgical repair  of the large hiatal hernia.                                                Bernette Redbird, M.D.    RB/MEDQ  D:  04/28/2003  T:  04/28/2003  Job:  [id number]   cc:   William A. Hensel, M.D.  [address] [place]  [place] [address]  Fax: [phone]

## 2011-01-13 NOTE — Discharge Summary (Signed)
Karen Jackson. [facility] [address]  Patient:    Karen Jackson, Karen Jackson                          MRN: [id number] Adm. Date:  [id number] Disc. Date: [id number] Attending:  Georgena Spurling Dictator:   Jamelle Rushing, P.A. CC:         Santiago Bumpers Leveda Anna, M.D.   Discharge Summary  ADMISSION DIAGNOSES: 1. Left knee medial compartment osteoarthritis. 2. Gastroesophageal reflux disease.  DISCHARGE DIAGNOSES: 1. Left knee medial compartment ______  arthroplasty. 2. Gastroesophageal reflux disease.  HISTORY OF PRESENT ILLNESS:  The patient is a 70 year old white female with a history of left knee pain for approximately six months.  The patient initially had a catch and pain and a sensation that her knee was giving out. Arthroscopic evaluation in December found the knee with medial compartment osteoarthritis, worse than expected by x-ray, and she had no improvement of her symptoms.  The patient continued to have catching pain with ambulation. It increased with stairs and any slight incline.  She did have significant swelling.  The patient described a sharp pain along the medial joint line with no radiation.  She did not have any discomfort while sitting, and she was not currently using an assistive device.  ALLERGIES:  CODEINE.  MEDICATIONS: 1. Vioxx 25 mg p.o. q.d. 2. Premarin 0.625 mg p.o. q.d. 3. Provera 2.5 mg p.o. q.d. 4. Prilosec 20 mg p.o. q.d.  PROCEDURES:  On October 24, 2000, the patient was taken to the OR by Dr. Georgena Spurling, assisted by Arnoldo Morale, P.A.C.  Under general anesthesia the patient had a left medial compartment ______ arthroplasty performed without any complications.  The patient tolerated the procedure well.  The tourniquet time was approximately 57 minutes.  There were no complications and no drains left in place.  CONSULTATIONS:  Routine physical therapy and occupational therapy consults were requested.  HOSPITAL COURSE:  On October 24, 2000, the  patient was admitted to [facility] under the care of Dr. Georgena Spurling.  The patient was taken to the OR where a left medial compartment arthroplasty was performed without any complications.  The patient was then transferred to the recovery room after receiving a left femoral nerve block.  The patient then was transferred to the orthopedic floor in good condition.  On postoperative day #1 the patient was without any complaints.  She was tolerating the CPM up to 90 degrees.  Vital signs were all stable.  Her H&H and chemistries were also stable.  Her left knee incision was well approximated with staples.  No sign of erythema or discharge, no Homans sign in the leg.  She was neuromotor vascularly intact with the exception that she felt some numbness throughout the knee, and her knee would keep on buckling when she was attempting to ambulate.  This was felt to be residual effects from the femoral nerve block, and it did improve later in the day, and she had more success with ambulation with the afternoon in physical therapy.  On postoperative day #2 the patient only had some complaints of generalized itching.  We were uncertain whether it was due to the laundry or the Percocet. Otherwise, her vital signs were stable.  Her left knee wound was benign.  She had already been up walking around this morning without any difficulty. Arrangements were made for the patient to be discharged with home health physical therapy, and she was  discharged today in good condition.  We would discharge her with Percocet.  If she continued to have itching with this, we would change her over to the Vicodin.  LABORATORY DATA:  Routine chemistries on admission:  Sodium of 137, potassium of 3.5, glucose 127, BUN 6, creatinine 0.5.  H&H on October 26, 2000, were 10.2 and 30.8.  EKG on admission was normal sinus rhythm.  DISCHARGE MEDICATIONS: 1. Premarin 0.625 mg p.o. q.d. 2. Provera 2.5 mg p.o. q.d. 3.  Protonix 40 mg p.o. q.d. 4. Benadryl 25 mg p.o. q.6h. p.r.n. itch. 5. Tylenol 650 mg p.o. q.6h. p.r.n. temperature. 6. Percocet 1 or 2 tablets every four to six hours p.r.n. pain.  DISCHARGE INSTRUCTIONS: 1. Medications:  Continue routine medications. 2. Percocet 5 mg 1 or 2 tablets every four to six hours for pain if needed, or    change to Vicodin HP if Percocet continues to cause an itch. 3. Activity:  As tolerated with the use of the walker. 4. Diet:  No restrictions. 5. Wound care:  Keep wound clean and dry.  Check daily for infections. 6. Special instructions:  Home health physical therapy three times a week. 7. Follow-up:  The patient is to call for a follow-up appointment with    Dr. Sherlean Foot in the next 10-14 days.  DISPOSITION:  Home.  CONDITION ON DISCHARGE:  Good. DD:  10/26/00 TD:  10/27/00 Job: [id number]

## 2011-01-13 NOTE — Discharge Summary (Signed)
[name], [name] NO.:  [id number]   MEDICAL RECORD NO.:  [id number]          PATIENT TYPE:  INP   LOCATION:  0440                         FACILITY:  [facility]   PHYSICIAN:  Bernette Redbird, M.D.   DATE OF BIRTH:  03-14-1941   DATE OF ADMISSION:  10/21/2004  DATE OF DISCHARGE:  10/22/2004                                 DISCHARGE SUMMARY   FINAL DIAGNOSES:  1.  Acute appendicitis.  2.  Coffee-ground emesis.  3.  Reflux esophagitis.   COMPLICATIONS DURING THE ADMISSION:  None.   CONSULTATIONS:  Dr. Danna Hefty, general surgery.   PROCEDURES:  1.  Upper endoscopy (on observation status, prior to admission).  2.  Laparoscopically assisted appendectomy.   LABORATORY DATA:  Admission white count 13,900 (day prior to admission).  Discharge white count 8900, hemoglobin 14.2 following hydration, platelets  normal, 87 polys, 5 lymphocytes.  INR normal.  Liver chemistries minimally  elevated on admission, normal on day of discharge.   HOSPITAL COURSE:  Karen Jackson was initially brought in on observation status  the day prior to admission because of coffee-ground emesis.  Endoscopy  showed confluent distal reflux-type erosive esophagitis, either due to  primary reflux or to protracted vomiting.  In the course of her evaluation,  even though it was not her chief complaint, it was determined that the  patient had some degree of right-sided abdominal pain.  Moreover, she had a  low-grade fever of around 101.5 and mild leukocytosis, so it was elected to  observe her overnight.  The pain, fever and leukocytosis did not resolve, so  we obtained a CT scan which showed acute appendicitis, which was managed  operatively by Dr. Danna Hefty, which included necessity of resection  of a small portion of the cecum due to adjacent inflammation.  The patient  made a good recovery and was discharged home.  Pathology on the appendix  showed acute suppurative appendicitis  with associated perforation.   DISPOSITION:  The patient is discharged home with instructions to follow up  with Dr. Luan Jackson.  There was no specific mention of medications.  The  patient may have a regular diet and was instructed not to lift for a month.   CONDITION ON DISCHARGE:  Improved.      RB/MEDQ  D:  11/07/2004  T:  11/08/2004  Job:  [id number]   cc:   Karen Jackson, M.D.  Fax: [phone]   Karen Ports, MD  [address], [address]  [place]  [place] [address]

## 2011-01-13 NOTE — Op Note (Signed)
Karen Jackson, LANDING NO.:  [id number]   MEDICAL RECORD NO.:  [id number]          PATIENT TYPE:  EMS   LOCATION:  ED                           FACILITY:  [facility]   PHYSICIAN:  Bernette Redbird, M.D.   DATE OF BIRTH:  [birth year]   DATE OF PROCEDURE:  10/20/2004  DATE OF DISCHARGE:                                 OPERATIVE REPORT   PROCEDURE:  Upper endoscopy.   INDICATION:  Ms. Poster is a 70 year old female with minimal hematemesis in  associated with protracted vomiting.   FINDINGS:  Distal esophagitis and intact Nissen wrap.   DESCRIPTION OF PROCEDURE:  The nature, purpose and risks of the procedure  were familiar to the patient, who provided written consent.  Sedation was  fentanyl 25 mcg and Versed 2 mg IV without arrhythmias or desaturation.  The  Olympus video endoscope was passed under direct vision.  The larynx was  grossly unremarkable.  The esophagus was entered.   Although the proximal esophagus was normal, the distal esophagus had  confluent white exudate consistent with superficial erosive changes.  There  was some friability there with a minimal amount of contact hemorrhage.  No  deep ulcerations were encountered nor was there any evidence of neoplasia or  esophageal stricture, varices or infection.  No hiatal hernia could be  appreciated.   The stomach was entered and noted to contain about 100 mL residual of clear  green fluid without any blood or coffee ground material or clots.  There was  a little bit of punctate erythema in the proximal stomach, but no erosions,  ulcers, polyps or masses were observed.  There may have been some mild  mucosal hemorrhages or stress gastritis in the proximal stomach.  No  retroflexed viewing, the wrap appeared intact.  The antrum, pylorus,  duodenal bulb and proximal second duodenum looked normal despite the fact  that the patient was on Celebrex.   The scope was removed from the patient.  No biopsies were  obtained.  She  tolerated the procedure and there were no apparent complications.   IMPRESSION:  1.  Mild to moderate confluent distal erosive esophagitis with slight      friability, probably accounting for the patient's recent small volume      hematemesis (530.11).  It is unclear whether this is a consequence of      her recent vomiting or reflects some recurrent reflux esophagitis.  2.  No active bleeding or blood in the stomach at the time of this exam.  3.  Endoscopically-intact Nissen wrap.   PLAN:  The patient will be cared for on overnight observation in the  hospital with PPI therapy, antiemetics and IV fluids.  She will probably  need followup endoscopy in a few months to confirm healing.  She will  probably need at least two months of PPI therapy.      RB/MEDQ  D:  10/20/2004  T:  10/20/2004  Job:  [id number]   cc:   William A. Leveda Anna, M.D.  Fax: [phone]

## 2011-01-13 NOTE — Discharge Summary (Signed)
Karen Jackson, CACCAMO NO.:  [id number]   MEDICAL RECORD NO.:  [id number]          PATIENT TYPE:  INP   LOCATION:  5039                         FACILITY:  [facility]   PHYSICIAN:  Mila Homer. Sherlean Foot, M.D. DATE OF BIRTH:  1941/05/19   DATE OF ADMISSION:  10/23/2005  DATE OF DISCHARGE:  10/27/2005                                 DISCHARGE SUMMARY   ADDENDUM:  This is an addendum to discharge summary [phone].   The patient held due to nausea and hematemesis. Postoperative day 4, the  patient states that nausea has resolved. The patient without complaints and  ready for discharge home. The patient was with temperature max of about  99.9, otherwise, vital signs stable. H&H was 9.2 and 26.3. No symptoms of  anemia. The patient is able to ambulate about the room on her own with the  use of a walker. No new B-met at this time.   DISPOSITION:  The patient is to be discharged in good stable condition after  working with physical therapy on step training this morning.   FOLLOW UP:  The patient to followup with Dr. Sherlean Foot in the office on November 07, 2005. The patient will call office for appointment. Check of patient's  potassium will be performed next [facility].      Richardean Canal, P.A.    ______________________________  Mila Homer. Sherlean Foot, M.D.    GC/MEDQ  D:  10/27/2005  T:  10/27/2005  Job:  [id number]   cc:   Mila Homer. Sherlean Foot, M.D.  Fax: [phone]

## 2011-01-13 NOTE — Op Note (Signed)
[place]. [facility]  Patient:    Karen Jackson, Karen Jackson                         MRN: [id number] Proc. Date: 10/24/00 Adm. Date:  10/24/00 Attending:  Georgena Spurling, M.D.                           Operative Report  SURGEON:  Georgena Spurling, M.D.  ASSISTANT:  Arnoldo Morale, P.A.-C.  ANESTHESIA:  General endotracheal.  INDICATIONS:  The patient is a 70 year old white female with medial compartment osteoarthritis of the knee, who failed conservative treatment and arthroscopic treatment.  PROCEDURE:  Left medial compartmental arthroplasty.  DESCRIPTION OF PROCEDURE:  After an informed consent was obtained, she was taken to the operating room.  The patient was laid supine.  The administration of general endotracheal anesthesia.  The left lower extremity was prepped and draped in the usual sterile fashion.  A standard midline incision was made on the medial border of the patellar tendon from the tibial tubercle to the patella.  Sharp dissection was continued down, raising flaps to the patella tendon and patella retinaculum.  We then made a longitudinal incision next to the patella tendon, and removed the anterior fat pad.  We then Td the capsule, marking it with methylene blue.  We then placed Deaver retractors in place.  We subperiosteally elevated the deep MCL off of the medial crest of the tibia.  We then put the knee in extension, set our distal femoral condylar guide, and tensioned it against the tibial plateau, and then set our mechanical axis with the tibial guide and alignment rods, using the APC arm imaging.  We then pinned it into place and made our distal femoral cut with the Z-retractors, protecting all ligaments.  We then removed the alignment devices, and went into flexion and placed the tibial cutting device, made our tibial cut with the sagittal saw and the reciprocating saw.  At this point we sized our tibia which was at a size #1 with excellent  coverage.  We then placed the trial into place, keeled our lug holes.  We then placed the sizing guide on the femur and chose a small plus, and placed a pin anteriorly and posteriorly, made our chamfer and posterior condylar cuts, and our anterior and posterior local cuts.  We then trialed with a femur in place, and a #10 lollipop was tensioned well in flexion and extension.  At this point we felt that a 12 mm polyethylene with the tibial trial in place gave [place] excellent flexion and extension gap balance.  We could still get the 1.0 mm tongue depressor in, in flexion and extension.  Therefore we removed the components and irrigated copiously, and then cemented in the tibial first, the femur second, and pumped the polyethylene into place where the cement was hardening, brought the leg into extension.  We then closed the capsule with interrupted #1 Vicryl, the deep soft tissue with #0 Vicryl, the subcuticular with #2-0 Vicryl, and the skin with staples in flexion.  Dressed with Adaptic, 4 x 4s, Webril, and TED hose.  The patient tolerated the procedure well.  The tourniquet time was 57 minutes.   COMPLICATIONS:  None.  DRAINS:  None. DD:  10/24/00 TD:  10/24/00 Job: [id number]

## 2011-01-13 NOTE — Discharge Summary (Signed)
NAME:  Karen Jackson, ALBARRACIN                             ACCOUNT NO.:  [id number]   MEDICAL RECORD NO.:  [id number]                    PATIENT TYPE:   LOCATION:                                       FACILITY:   PHYSICIAN:  Thornton Park. Daphine Deutscher, M.D.             DATE OF BIRTH:   DATE OF ADMISSION:  08/05/2003  DATE OF DISCHARGE:  08/07/2003                                 DISCHARGE SUMMARY   ADMISSION DIAGNOSES:  Large hiatal hernia repair, esophageal component,  recurrent gastrointestinal bleed.   DISCHARGE DIAGNOSES:  Large hiatal hernia repair, esophageal component,  recurrent gastrointestinal bleed.   PROCEDURE:  Laparoscopic Nissen fundoplication with pledgeted suture closure  of large hiatal hernia.   COURSE IN THE HOSPITAL:  [name] is a 70 year old lady who underwent the  aforementioned operation on the day of admission, August 05, 2003.  Postoperatively she did well.  A swallow was done which looked good.  Swallowing mechanism looked fine. She had switched from PCA morphine to  [facility] and started on diet and was prepared for discharge on August 08, 2003.   CONDITION ON DISCHARGE:  Good.   FOLLOW UP:  Follow up arrangements were made to see [name] B. Daphine Deutscher, M.D.  in the office within 2 weeks.   FINAL DIAGNOSES:  Large hiatal hernia repair, esophageal component (large  type 3 mixed hiatal hernia).   PROGNOSIS:  Good.                                               Thornton Park Daphine Deutscher, M.D.    MBM/MEDQ  D:  09/16/2003  T:  09/16/2003  Job:  [id number]   cc:   William A. Hensel, M.D.  [address] [place]  [place] [address]  Fax: [phone]   Bernette Redbird, M.D.  [address] [place]  [place], [place] [address]  Fax: [phone]

## 2011-01-13 NOTE — Op Note (Signed)
[name], ZWIEFELHOFER NO.:  [id number]   MEDICAL RECORD NO.:  [id number]          PATIENT TYPE:  INP   LOCATION:  0440                         [facility]ACILITY[facility]   PHYSICIAN:  Vikki Ports, MDDATE OF BIRTH:  1940-10-03   DATE OF PROCEDURE:  10/21/2004  DATE OF DISCHARGE:  10/22/2004                                 OPERATIVE REPORT   PREOPERATIVE DIAGNOSIS:  Acute appendicitis.   POSTOPERATIVE DIAGNOSIS:  Acute appendicitis.   PROCEDURE:  Laparoscopic appendectomy and partial cecectomy.   SURGEON:  Danna Hefty, M.D.   ANESTHESIA:  General.   DESCRIPTION OF PROCEDURE:  The patient was taken to the operating room and  placed in a supine position.  After adequate anesthesia was induced using  endotracheal tube, the abdomen was prepped and draped in the normal sterile  fashion.  A 12 mm trocar was placed in the left lower quadrant using the  OptiView technique.  A 5 mm trocar was then placed in the mid abdomen, and a  12 mm trocar was placed in the left upper quadrant.  The cecum was retracted  medially, and a retrocecal appendix was identified.  Its adhesions were then  taken down and mobilizing and seeing the base of the appendix which was  quite gangrenous.  There was significant edema extending onto the cecum.  The terminal ileum was identified and using the 60 mm blue-load Echelon  stapling device, the appendix and a portion of the cecum were divided,  placed in EndoCatch bag and removed.  The right lower quadrant was then  copiously irrigated.  Pneumoperitoneum was released.  Skin incisions were  closed with staples.  The patient tolerated the procedure well went to PACU  in good condition.      KRH/MEDQ  D:  10/23/2004  T:  10/23/2004  Job:  [id number]

## 2011-01-13 NOTE — Op Note (Signed)
Karen Jackson, COKLEY NO.:  [id number]   MEDICAL RECORD NO.:  [id number]          PATIENT TYPE:  INP   LOCATION:  1614                         FACILITY:  [facility]   PHYSICIAN:  Petra Kuba, M.D.    DATE OF BIRTH:  April 15, 1941   DATE OF PROCEDURE:  01/16/2006  DATE OF DISCHARGE:                                 OPERATIVE REPORT   PROCEDURE:  Endoscopic retrograde cholangiopancreatography sphincterotomy,  stone extraction.   INDICATION:  Probable CBD stone and moderate obstruction on intraoperative  cholangiogram, elevated liver test.  Consent was signed after risks,  benefits, methods, options thoroughly discussed with both the patient and  her husband.   MEDICINES USED:  Fentanyl 87.5, Versed 7.   DESCRIPTION OF PROCEDURE:  A side-viewing, therapeutic video duodenoscope  was inserted by indirect vision into the stomach, advanced through a normal  antrum and pylorus into the duodenum; and a normal-appearing ampulla was  brought into view.  Using the triple-lumen sphincterotome selective  cannulation was obtained.  No obvious stone was seen on the initial  cholangiogram; although there was a semi cut off of the distal duct.  The  Al Pimple was advanced into the intrahepatics and deep selective cannulation  was obtained.  No PD injections were obtained throughout the procedure.  We  could fill the right system, but based on her lying on her side, we did not  get any filling of the left side.  We went ahead and proceeded with the  medium-sized sphincterotomy in the customary fashion.  There was minimal  heme seen, which stopped by the end of procedure.  We continued the  sphincterotomy until we got adequate biliary drainage and were able to get  the fully bowed sphincterotome in-and-out of the duct.   We then proceeded with three, 90-mm, adjustable balloon pull-throughs.  The  balloon catheter was exchanged for the sphincterotome in the customary  fashion.  There was  some debris and sludge removed, but no frank stones.  The balloon passed readily through the sphincterotomy site without  resistance.  We went ahead and proceeded with an occlusion cholangiogram  which was normal.  The balloon was removed.  There was excellent biliary  drainage.  Once removal we elected to terminate the procedure at this  junction, the ampulla was washed and watched and there was no signs of  bleeding from the sphincterotomy site at the end of the procedure.  The  patient tolerated the procedure well.  There was no obvious immediate  complications.   ENDOSCOPIC DIAGNOSES:  1.  Normal ampulla.  2.  No PD injections  3.  Normal CBD except for possibly a slight distal cut off.  Right system      was filled with some bile, but no contrast seen in the left side.  4.  Status post medium sized sphincterotomy and three 9-mm balloon pull      throughs with sludge and debris delivered only.  5.  Negative occlusion cholangiogram.  6.  Excellent biliary drainage at the end of procedure.   PLAN:  Observe for delayed complications.  Repeat liver tests in the a.m.  and follow as an outpatient back to normal with no delayed complications.  Probably can advance diet, and go home tomorrow.           ______________________________  Petra Kuba, M.D.     MEM/MEDQ  D:  01/16/2006  T:  01/16/2006  Job:  [id number]   cc:   [place] Daphine Deutscher, MD  [address]  [place]  [place] [address]   Bernette Redbird, M.D.  Fax: [phone]

## 2011-01-30 ENCOUNTER — Encounter (HOSPITAL_BASED_OUTPATIENT_CLINIC_OR_DEPARTMENT_OTHER)
Admission: RE | Admit: 2011-01-30 | Discharge: 2011-01-30 | Disposition: A | Discharge status: Home / Self Care | Payer: Medicare Other | Source: Ambulatory Visit | Attending: General Surgery | Admitting: General Surgery

## 2011-01-30 LAB — DIFFERENTIAL
Basophils Relative: 0 % (ref 0–1)
Eosinophils Absolute: 0.1 10*3/uL (ref 0.0–0.7)
Eosinophils Relative: 2 % (ref 0–5)
Lymphs Abs: 2 10*3/uL (ref 0.7–4.0)
Monocytes Absolute: 0.8 10*3/uL (ref 0.1–1.0)
Monocytes Relative: 12 % (ref 3–12)

## 2011-01-30 LAB — COMPREHENSIVE METABOLIC PANEL
ALT: 46 U/L — ABNORMAL HIGH (ref 0–35)
Alkaline Phosphatase: 58 U/L (ref 39–117)
Chloride: 93 mEq/L — ABNORMAL LOW (ref 96–112)
Glucose, Bld: 211 mg/dL — ABNORMAL HIGH (ref 70–99)
Potassium: 3.7 mEq/L (ref 3.5–5.1)
Sodium: 136 mEq/L (ref 135–145)
Total Bilirubin: 0.4 mg/dL (ref 0.3–1.2)
Total Protein: 6.9 g/dL (ref 6.0–8.3)

## 2011-01-30 LAB — URINALYSIS, ROUTINE W REFLEX MICROSCOPIC
Bilirubin Urine: NEGATIVE
Ketones, ur: NEGATIVE mg/dL
Nitrite: NEGATIVE
Protein, ur: NEGATIVE mg/dL
Urobilinogen, UA: 1 mg/dL (ref 0.0–1.0)
pH: 6 (ref 5.0–8.0)

## 2011-01-30 LAB — CBC
MCH: 34.3 pg — ABNORMAL HIGH (ref 26.0–34.0)
MCHC: 34.5 g/dL (ref 30.0–36.0)
MCV: 99.3 fL (ref 78.0–100.0)
Platelets: 307 10*3/uL (ref 150–400)
RDW: 12.6 % (ref 11.5–15.5)

## 2011-01-30 LAB — URINE MICROSCOPIC-ADD ON

## 2011-01-31 ENCOUNTER — Other Ambulatory Visit (INDEPENDENT_AMBULATORY_CARE_PROVIDER_SITE_OTHER): Payer: Self-pay | Admitting: General Surgery

## 2011-01-31 ENCOUNTER — Ambulatory Visit (HOSPITAL_BASED_OUTPATIENT_CLINIC_OR_DEPARTMENT_OTHER)
Admission: RE | Admit: 2011-01-31 | Discharge: 2011-01-31 | Disposition: A | Discharge status: Home / Self Care | Payer: Medicare Other | Source: Ambulatory Visit | Attending: General Surgery | Admitting: General Surgery

## 2011-01-31 DIAGNOSIS — E119 Type 2 diabetes mellitus without complications: Secondary | ICD-10-CM | POA: Insufficient documentation

## 2011-01-31 DIAGNOSIS — Z01812 Encounter for preprocedural laboratory examination: Secondary | ICD-10-CM | POA: Insufficient documentation

## 2011-01-31 DIAGNOSIS — N6089 Other benign mammary dysplasias of unspecified breast: Secondary | ICD-10-CM | POA: Insufficient documentation

## 2011-01-31 DIAGNOSIS — E669 Obesity, unspecified: Secondary | ICD-10-CM | POA: Insufficient documentation

## 2011-01-31 DIAGNOSIS — I10 Essential (primary) hypertension: Secondary | ICD-10-CM | POA: Insufficient documentation

## 2011-02-02 NOTE — Op Note (Signed)
NAMEVADIE, PRINCIPATO NO.:  [id number]  MEDICAL RECORD NO.:  [id number]  LOCATION:                                 FACILITY:  PHYSICIAN:  Angelia Mould. Derrell Lolling, M.D.     DATE OF BIRTH:  DATE OF PROCEDURE:  01/31/2011 DATE OF DISCHARGE:                              OPERATIVE REPORT   PREOPERATIVE DIAGNOSIS:  Atypical lobular hyperplasia, left breast, upper outer quadrant.  POSTOPERATIVE DIAGNOSIS:  Atypical lobular hyperplasia, left breast, upper outer quadrant.  OPERATION PERFORMED:  Left partial mastectomy with needle localization.  SURGEON:  Angelia Mould. Derrell Lolling, MD  OPERATIVE INDICATIONS:  This is a 70 year old Caucasian female without any history of prior breast problems other than a breast cyst aspiration many years ago.  Mammograms in September 2011 showed some calcifications in the left breast which on compression view looked benign.  She is brought back for followup and there were many calcifications in the left breast posterolateral, upper outer quadrant, it was thought to be a little bit more concerning.  Image-guided biopsy showed atypical lobular hyperplasia and involving a fibroadenoma within the calcifications.  She was referred for excision of this area.  I felt that it was very reasonable to excise this area to exclude low grade malignancy.  The patient was in favor of this.  She was brought to the operating room electively.  OPERATIVE TECHNIQUE:  The patient underwent wire localization at the [facility] today by Dr. Rosalva Ferron.  The wire went through the calcifications and beyond.  She was brought to the holding area at [facility].  She was identified and then taken to the operating room.  General anesthesia with LMA device was induced by Dr. Sheldon Silvan.  The left breast was then prepped and draped in a sterile fashion.  Intravenous antibiotics were given.  The patient was identified as correct patient, correct procedure  and correct site. Marcaine 0.5% with epinephrine was used as a local infiltration anesthetic.  I observed the localizing wire entering the far lateral aspect of the breast and directed medially.  A curvilinear incision was made in the lateral breast at about 2:30 position, a little bit more medial to the wire insertion site.  We dissected down into the breast tissue and brought the wire into the wound.  We dissected around the wire all the way down to the pectoralis fascia.  The specimen was removed and marked with the 6 color margin marker kit.  Specimen mammogram showed that the entire wire and the calcifications were within the specimen but were somewhat off to one side I.  We felt that we had completely removed this area.  Hemostasis was excellent and achieved with electrocautery.  The wound was irrigated with saline.  The breast tissues were closed with interrupted sutures of 3-0 Vicryl and the skin closed with a running subcuticular suture of 4-0 Monocryl and Dermabond.  The patient tolerated the procedure well and was taken to the recovery room in stable condition. Estimated blood loss was about 15-20 mL.  Complications were none. Sponge, needles and instrument counts were correct.     Angelia Mould.  Derrell Lolling, M.D.     HMI/MEDQ  D:  01/31/2011  T:  02/01/2011  Job:  [id number]  cc:   Jeralyn Ruths, MD Santiago Bumpers. Leveda Anna, M.D.  Electronically Signed by Claud Kelp M.D. on 02/02/2011 04:19:39 PM

## 2011-02-20 ENCOUNTER — Encounter: Payer: Self-pay | Admitting: Family Medicine

## 2011-02-20 DIAGNOSIS — R920 Mammographic microcalcification found on diagnostic imaging of breast: Secondary | ICD-10-CM

## 2011-02-20 NOTE — Progress Notes (Signed)
Cleared after excisional biopsy of breast and path with good margins.

## 2011-03-10 ENCOUNTER — Other Ambulatory Visit: Payer: Self-pay | Admitting: Family Medicine

## 2011-03-10 ENCOUNTER — Encounter: Payer: Self-pay | Admitting: Family Medicine

## 2011-03-10 ENCOUNTER — Ambulatory Visit (INDEPENDENT_AMBULATORY_CARE_PROVIDER_SITE_OTHER): Payer: Medicare Other | Admitting: Family Medicine

## 2011-03-10 VITALS — BP 159/80 | HR 70 | Temp 97.1°F | Ht 59.0 in | Wt 176.0 lb

## 2011-03-10 DIAGNOSIS — M1990 Unspecified osteoarthritis, unspecified site: Secondary | ICD-10-CM

## 2011-03-10 DIAGNOSIS — E7800 Pure hypercholesterolemia, unspecified: Secondary | ICD-10-CM

## 2011-03-10 DIAGNOSIS — I10 Essential (primary) hypertension: Secondary | ICD-10-CM

## 2011-03-10 DIAGNOSIS — R7309 Other abnormal glucose: Secondary | ICD-10-CM

## 2011-03-10 DIAGNOSIS — E559 Vitamin D deficiency, unspecified: Secondary | ICD-10-CM

## 2011-03-10 MED ORDER — GABAPENTIN 100 MG PO CAPS: 100.0000 mg | ORAL_CAPSULE | Freq: Three times a day (TID) | ORAL | Status: DC

## 2011-03-10 NOTE — Assessment & Plan Note (Signed)
Check A1C 

## 2011-03-10 NOTE — Assessment & Plan Note (Signed)
Check FLP 

## 2011-03-10 NOTE — Assessment & Plan Note (Signed)
Check level 

## 2011-03-10 NOTE — Assessment & Plan Note (Signed)
Trial of gabapentin

## 2011-03-10 NOTE — Patient Instructions (Signed)
Try the new nerve pain medicine and call me in 1 month to let me know how its working. Do the stand up exercises to strengthen your quads. Come back fasting some morning to get the blood work done. Take your blood pressure several times to see and let me know.  We are aiming for most readings to be 140/90 or below.

## 2011-03-10 NOTE — Telephone Encounter (Signed)
Refill request

## 2011-03-10 NOTE — Assessment & Plan Note (Signed)
Likely good control despite today's BP

## 2011-03-10 NOTE — Progress Notes (Signed)
  Subjective:    Patient ID: Karen Jackson, female    DOB: October 10, 1940, 70 y.o.   MRN: [id number]  HPI Doing well Had recent breast biopsy which turned out OK Last fall had parathyroidectomy and has had normal calciums since. Was told she no longer needed vit D based on a blood test but I cannot see any such results in system Has chronic back pain with Rt radiculopathy which is followed by neurosurg and ortho.  Not on any neuropathic pain meds. BPs during these multiple doctor visits are reportedly normal.   Review of Systems     Objective:   Physical Exam Lungs clear. Cardiac RRR without M Rt leg is weak in quads and has deminished ankle and knee DTRs.       Assessment & Plan:

## 2011-03-23 ENCOUNTER — Other Ambulatory Visit: Payer: Medicare Other

## 2011-03-23 DIAGNOSIS — E559 Vitamin D deficiency, unspecified: Secondary | ICD-10-CM

## 2011-03-23 DIAGNOSIS — E7800 Pure hypercholesterolemia, unspecified: Secondary | ICD-10-CM

## 2011-03-23 DIAGNOSIS — R7309 Other abnormal glucose: Secondary | ICD-10-CM

## 2011-03-23 LAB — LIPID PANEL
Total CHOL/HDL Ratio: 4.8 Ratio
VLDL: 29 mg/dL (ref 0–40)

## 2011-03-23 LAB — POCT GLYCOSYLATED HEMOGLOBIN (HGB A1C): Hemoglobin A1C: 6.2

## 2011-03-23 NOTE — Progress Notes (Signed)
Patient in office for labs today and requested BP check.  BP checked manually  LA 150/84 pulse 84. Will forward to MD.

## 2011-03-23 NOTE — Progress Notes (Signed)
FLP ,VIT D AND A1C DONE TODAY  

## 2011-03-24 LAB — VITAMIN D 25 HYDROXY (VIT D DEFICIENCY, FRACTURES): Vit D, 25-Hydroxy: 40 ng/mL (ref 30–89)

## 2011-03-27 ENCOUNTER — Telehealth: Payer: Self-pay | Admitting: Family Medicine

## 2011-03-27 MED ORDER — ATORVASTATIN CALCIUM 20 MG PO TABS: 20.0000 mg | ORAL_TABLET | Freq: Every day | ORAL | Status: DC

## 2011-03-27 NOTE — Telephone Encounter (Signed)
Called and informed about labs. She is having good response to gabapentin, is more active and has lost 6 lbs. Need to lower LDL.  Myalgias with simvastatin.  Will Rx with atorvastatin. Knows that she is borderline DM and follow a better diet to avoid the diagnosis and medications.   FU 3 months for dLDL

## 2011-04-04 ENCOUNTER — Other Ambulatory Visit: Payer: Self-pay | Admitting: Family Medicine

## 2011-04-04 NOTE — Telephone Encounter (Signed)
Refill request

## 2011-05-04 ENCOUNTER — Other Ambulatory Visit: Payer: Self-pay | Admitting: Family Medicine

## 2011-05-04 NOTE — Telephone Encounter (Signed)
Refill request

## 2011-05-30 ENCOUNTER — Telehealth: Payer: Self-pay | Admitting: *Deleted

## 2011-05-30 NOTE — Telephone Encounter (Signed)
Received notification from pharmacy that patient received flu vaccine today.

## 2011-06-02 ENCOUNTER — Other Ambulatory Visit: Payer: Self-pay | Admitting: Family Medicine

## 2011-06-02 NOTE — Telephone Encounter (Signed)
Please advise about this refill for Dr. Cyndia Skeeters patient

## 2011-06-02 NOTE — Telephone Encounter (Signed)
Refill request

## 2011-06-05 ENCOUNTER — Ambulatory Visit (INDEPENDENT_AMBULATORY_CARE_PROVIDER_SITE_OTHER): Payer: Medicare Other | Admitting: Home Health Services

## 2011-06-05 ENCOUNTER — Encounter: Payer: Self-pay | Admitting: Home Health Services

## 2011-06-05 VITALS — BP 153/86 | HR 72 | Temp 97.6°F | Ht 60.0 in | Wt 179.3 lb

## 2011-06-05 DIAGNOSIS — Z0000 Encounter for general adult medical examination without abnormal findings: Secondary | ICD-10-CM

## 2011-06-05 NOTE — Progress Notes (Signed)
Patient here for annual wellness visit, patient reports: Risk Factors/Conditions needing evaluation or treatment: Pt does not have any risk factors that need evaluation. Home Safety: Pt lives by self in 1 story home.  Pt reports having smoke detectors and adaptive equipment in the bathroom.  Other Information: Corrective lens: Pt wears daily corrective lens and visits eye doctor annually. Dentures: Pt does not have dentures and visits dentist every 6 months. Memory: Pt denies memory problems. Patient's Mini Mental Score (recorded in doc. flowsheet): 29  Balance/Gait:  Balance Abnormal Patient value  Sitting balance    Sit to stand    Attempts to arise    Immediate standing balance    Standing balance    Nudge    Eyes closed- Romberg    Tandem stance x unable  Back lean    Neck Rotation x Not complete rotation  360 degree turn    Sitting down     Gait Abnormal Patient value  Initiation of gait    Step length-left    Step length-right    Step height-left x Drag heels  Step height-right x Drag heels  Step symmetry    Step continuity    Path deviation    Trunk movement x Stiff, doesn't swing arms  Walking stance        Annual Wellness Visit Requirements Recorded Today In  Medical, family, social history Past Medical, Family, Social History Section  Current providers Care team  Current medications Medications  Wt, BP, Ht, BMI Vital signs  Hearing assessment (welcome visit) Hearing/vision  Tobacco, alcohol, illicit drug use History  ADL Nurse Assessment  Depression Screening Nurse Assessment  Cognitive impairment Nurse Assessment  Mini Mental Status Document Flowsheet  Fall Risk Nurse Assessment  Home Safety Progress Note  End of Life Planning (welcome visit) Social Documentation  Medicare preventative services Progress Note  Risk factors/conditions needing evaluation/treatment Progress Note  Personalized health advice Patient Instructions, goals, letter  Diet &  Exercise Social Documentation  Emergency Contact Social Documentation  Seat Belts Social Documentation  Sun exposure/protection Social Documentation    Prevention Plan: Pt is up to date on recommended screenings.  Recommended Medicare Prevention Screenings Women over 55 Test For Frequency Date of Last- BOLD if needed  Breast Cancer 1-2 yrs 2012  Cervical Cancer 1-3 yrs Not indicated  Colorectal Cancer 1-10 yrs 1/06  Osteoporosis once 6/10  Cholesterol 5 yrs 7/12  Diabetes yearly 7/12  HIV yearly declined  Influenza Shot yearly 10/11  Pneumonia Shot once 11/08  Zostavax Shot once 2/07

## 2011-06-05 NOTE — Progress Notes (Signed)
  Subjective:    Patient ID: Karen Jackson, female    DOB: 05-25-1941, 70 y.o.   MRN: [id number]  HPI I have reviewed this visit and discussed with Arlys John and agree with her documentation.      Review of Systems     Objective:   Physical Exam        Assessment & Plan:

## 2011-06-05 NOTE — Patient Instructions (Signed)
1. Continue to swim 3 times a week and golf 2 times a week. 2. Continue to focus on eating vegetables and limiting starches. 3. Bring in a copy of your living will for Dr. Leveda Anna. 4. Schedule a follow up visit with Dr. Leveda Anna for beginning of November.

## 2011-06-29 ENCOUNTER — Other Ambulatory Visit: Payer: Medicare Other

## 2011-06-29 DIAGNOSIS — E7800 Pure hypercholesterolemia, unspecified: Secondary | ICD-10-CM

## 2011-06-29 NOTE — Progress Notes (Signed)
DIRECT LDL DONE TODAY  

## 2011-06-30 ENCOUNTER — Encounter: Payer: Self-pay | Admitting: Family Medicine

## 2011-06-30 LAB — LDL CHOLESTEROL, DIRECT: Direct LDL: 93 mg/dL

## 2011-07-25 ENCOUNTER — Encounter: Payer: Self-pay | Admitting: Home Health Services

## 2011-08-23 ENCOUNTER — Other Ambulatory Visit: Payer: Self-pay | Admitting: Family Medicine

## 2011-08-23 NOTE — Telephone Encounter (Signed)
Refill request

## 2011-08-29 DIAGNOSIS — I639 Cerebral infarction, unspecified: Secondary | ICD-10-CM

## 2011-08-29 HISTORY — DX: Cerebral infarction, unspecified: I63.9

## 2011-09-04 DIAGNOSIS — H93299 Other abnormal auditory perceptions, unspecified ear: Secondary | ICD-10-CM | POA: Diagnosis not present

## 2011-09-04 DIAGNOSIS — H6120 Impacted cerumen, unspecified ear: Secondary | ICD-10-CM | POA: Diagnosis not present

## 2011-10-02 ENCOUNTER — Other Ambulatory Visit: Payer: Self-pay | Admitting: Family Medicine

## 2011-10-02 NOTE — Telephone Encounter (Signed)
Refill request

## 2011-10-04 DIAGNOSIS — M25579 Pain in unspecified ankle and joints of unspecified foot: Secondary | ICD-10-CM | POA: Diagnosis not present

## 2011-10-04 DIAGNOSIS — M19079 Primary osteoarthritis, unspecified ankle and foot: Secondary | ICD-10-CM | POA: Diagnosis not present

## 2011-10-27 ENCOUNTER — Ambulatory Visit (INDEPENDENT_AMBULATORY_CARE_PROVIDER_SITE_OTHER): Payer: Medicare Other | Admitting: Family Medicine

## 2011-10-27 ENCOUNTER — Encounter: Payer: Self-pay | Admitting: Family Medicine

## 2011-10-27 VITALS — BP 136/78 | HR 76 | Temp 98.0°F | Ht <= 58 in | Wt 177.0 lb

## 2011-10-27 DIAGNOSIS — M949 Disorder of cartilage, unspecified: Secondary | ICD-10-CM

## 2011-10-27 DIAGNOSIS — R079 Chest pain, unspecified: Secondary | ICD-10-CM

## 2011-10-27 DIAGNOSIS — M899 Disorder of bone, unspecified: Secondary | ICD-10-CM

## 2011-10-27 DIAGNOSIS — R0781 Pleurodynia: Secondary | ICD-10-CM | POA: Insufficient documentation

## 2011-10-27 NOTE — Progress Notes (Signed)
  Subjective:    Patient ID: Karen Jackson, female    DOB: 1941-06-21, 71 y.o.   MRN: [id number]  HPI Patient had "cold" with dry cough x 10 days.  Felt something pop and for the past 4 days has experienced Rt. Post lower thoracic pain.  Hurts with movement or deep breath.  Denies fever or SOB.  "My cold is gone."  No risk factors for PE, specifically no trauma, recent surg, leg swelling or immobility.  Does not like pain meds.  Many cause nausea like codeine.    Review of Systems     Objective:   Physical Exam Lungs clear Pain is very focal and reproducible in the right lower post thoracic region - about T10. Cardiac RRR without m Ext no edema.       Assessment & Plan:

## 2011-10-27 NOTE — Patient Instructions (Signed)
You likely popped a rib (stress fracture) from coughing. OK to use your lidoderm patches on that spot. It should do nothing but get better from hear.  Call me and I will get a chest XRay if fever, shortness of breath or failure to improve.  A rash in the area could mean shingles. FYI, we should stop the fosamax after 5 years, which would be 01/2014

## 2011-10-27 NOTE — Assessment & Plan Note (Signed)
Likely stress fracture of rib from repetitive coughing.  Could be pleurisy but doubt to very focal pain.  No evidence of ongoing infection.  Will observe.  She can use lidoderm patch for the pain.  Encourage deep breath and cough.

## 2011-10-30 ENCOUNTER — Telehealth: Payer: Self-pay | Admitting: Family Medicine

## 2011-10-30 DIAGNOSIS — R0781 Pleurodynia: Secondary | ICD-10-CM

## 2011-10-30 MED ORDER — HYDROCODONE-ACETAMINOPHEN 5-500 MG PO CAPS: 1.0000 | ORAL_CAPSULE | Freq: Four times a day (QID) | ORAL | Status: AC | PRN

## 2011-10-30 NOTE — Telephone Encounter (Signed)
Karen Jackson is asking for hydrocodone rx to be sent to [facility] on [address].  Having pain to rib area.

## 2011-10-30 NOTE — Assessment & Plan Note (Signed)
Pain continues.  Would like to try hydrocodone despite known nausea with codeine.  Will call in Rx.

## 2011-11-20 ENCOUNTER — Other Ambulatory Visit: Payer: Self-pay | Admitting: Family Medicine

## 2011-12-19 ENCOUNTER — Other Ambulatory Visit: Payer: Self-pay | Admitting: Family Medicine

## 2012-01-15 ENCOUNTER — Other Ambulatory Visit: Payer: Self-pay | Admitting: Family Medicine

## 2012-01-19 DIAGNOSIS — Z1231 Encounter for screening mammogram for malignant neoplasm of breast: Secondary | ICD-10-CM | POA: Diagnosis not present

## 2012-01-23 ENCOUNTER — Encounter: Payer: Self-pay | Admitting: Family Medicine

## 2012-02-22 DIAGNOSIS — I629 Nontraumatic intracranial hemorrhage, unspecified: Secondary | ICD-10-CM | POA: Diagnosis not present

## 2012-02-22 DIAGNOSIS — M159 Polyosteoarthritis, unspecified: Secondary | ICD-10-CM | POA: Diagnosis not present

## 2012-02-22 DIAGNOSIS — R1310 Dysphagia, unspecified: Secondary | ICD-10-CM | POA: Diagnosis not present

## 2012-02-22 DIAGNOSIS — I619 Nontraumatic intracerebral hemorrhage, unspecified: Secondary | ICD-10-CM | POA: Diagnosis not present

## 2012-02-22 DIAGNOSIS — I82409 Acute embolism and thrombosis of unspecified deep veins of unspecified lower extremity: Secondary | ICD-10-CM | POA: Diagnosis not present

## 2012-02-22 DIAGNOSIS — R5383 Other fatigue: Secondary | ICD-10-CM | POA: Diagnosis not present

## 2012-02-22 DIAGNOSIS — E785 Hyperlipidemia, unspecified: Secondary | ICD-10-CM | POA: Diagnosis present

## 2012-02-22 DIAGNOSIS — M129 Arthropathy, unspecified: Secondary | ICD-10-CM | POA: Diagnosis not present

## 2012-02-22 DIAGNOSIS — R4182 Altered mental status, unspecified: Secondary | ICD-10-CM | POA: Diagnosis not present

## 2012-02-22 DIAGNOSIS — Z5189 Encounter for other specified aftercare: Secondary | ICD-10-CM | POA: Diagnosis not present

## 2012-02-22 DIAGNOSIS — G8190 Hemiplegia, unspecified affecting unspecified side: Secondary | ICD-10-CM | POA: Diagnosis not present

## 2012-02-22 DIAGNOSIS — Z8249 Family history of ischemic heart disease and other diseases of the circulatory system: Secondary | ICD-10-CM | POA: Diagnosis not present

## 2012-02-22 DIAGNOSIS — I669 Occlusion and stenosis of unspecified cerebral artery: Secondary | ICD-10-CM | POA: Diagnosis not present

## 2012-02-22 DIAGNOSIS — I6789 Other cerebrovascular disease: Secondary | ICD-10-CM | POA: Diagnosis not present

## 2012-02-22 DIAGNOSIS — I69959 Hemiplegia and hemiparesis following unspecified cerebrovascular disease affecting unspecified side: Secondary | ICD-10-CM | POA: Diagnosis not present

## 2012-02-22 DIAGNOSIS — M25539 Pain in unspecified wrist: Secondary | ICD-10-CM | POA: Diagnosis not present

## 2012-02-22 DIAGNOSIS — I6350 Cerebral infarction due to unspecified occlusion or stenosis of unspecified cerebral artery: Secondary | ICD-10-CM | POA: Diagnosis not present

## 2012-02-22 DIAGNOSIS — G839 Paralytic syndrome, unspecified: Secondary | ICD-10-CM | POA: Diagnosis not present

## 2012-02-22 DIAGNOSIS — M1990 Unspecified osteoarthritis, unspecified site: Secondary | ICD-10-CM | POA: Diagnosis not present

## 2012-02-22 DIAGNOSIS — R509 Fever, unspecified: Secondary | ICD-10-CM | POA: Diagnosis not present

## 2012-02-22 DIAGNOSIS — M545 Low back pain: Secondary | ICD-10-CM | POA: Diagnosis not present

## 2012-02-22 DIAGNOSIS — I10 Essential (primary) hypertension: Secondary | ICD-10-CM | POA: Diagnosis not present

## 2012-02-22 DIAGNOSIS — R5381 Other malaise: Secondary | ICD-10-CM | POA: Diagnosis not present

## 2012-02-22 DIAGNOSIS — Z79899 Other long term (current) drug therapy: Secondary | ICD-10-CM | POA: Diagnosis not present

## 2012-02-22 DIAGNOSIS — R6889 Other general symptoms and signs: Secondary | ICD-10-CM | POA: Diagnosis not present

## 2012-02-22 DIAGNOSIS — M810 Age-related osteoporosis without current pathological fracture: Secondary | ICD-10-CM | POA: Diagnosis not present

## 2012-02-22 DIAGNOSIS — I517 Cardiomegaly: Secondary | ICD-10-CM | POA: Diagnosis not present

## 2012-02-26 DIAGNOSIS — K5900 Constipation, unspecified: Secondary | ICD-10-CM | POA: Diagnosis not present

## 2012-02-26 DIAGNOSIS — G459 Transient cerebral ischemic attack, unspecified: Secondary | ICD-10-CM | POA: Diagnosis not present

## 2012-02-26 DIAGNOSIS — E876 Hypokalemia: Secondary | ICD-10-CM | POA: Diagnosis not present

## 2012-02-26 DIAGNOSIS — M545 Low back pain: Secondary | ICD-10-CM | POA: Diagnosis not present

## 2012-02-26 DIAGNOSIS — I6789 Other cerebrovascular disease: Secondary | ICD-10-CM | POA: Diagnosis not present

## 2012-02-26 DIAGNOSIS — R7309 Other abnormal glucose: Secondary | ICD-10-CM | POA: Diagnosis not present

## 2012-02-26 DIAGNOSIS — B999 Unspecified infectious disease: Secondary | ICD-10-CM | POA: Diagnosis not present

## 2012-02-26 DIAGNOSIS — R4182 Altered mental status, unspecified: Secondary | ICD-10-CM | POA: Diagnosis not present

## 2012-02-26 DIAGNOSIS — M159 Polyosteoarthritis, unspecified: Secondary | ICD-10-CM | POA: Diagnosis not present

## 2012-02-26 DIAGNOSIS — R5381 Other malaise: Secondary | ICD-10-CM | POA: Diagnosis not present

## 2012-02-26 DIAGNOSIS — I6350 Cerebral infarction due to unspecified occlusion or stenosis of unspecified cerebral artery: Secondary | ICD-10-CM | POA: Diagnosis not present

## 2012-02-26 DIAGNOSIS — Z5189 Encounter for other specified aftercare: Secondary | ICD-10-CM | POA: Diagnosis not present

## 2012-02-26 DIAGNOSIS — I10 Essential (primary) hypertension: Secondary | ICD-10-CM | POA: Diagnosis not present

## 2012-02-26 DIAGNOSIS — M1990 Unspecified osteoarthritis, unspecified site: Secondary | ICD-10-CM | POA: Diagnosis not present

## 2012-02-26 DIAGNOSIS — I619 Nontraumatic intracerebral hemorrhage, unspecified: Secondary | ICD-10-CM | POA: Diagnosis not present

## 2012-02-26 DIAGNOSIS — I69959 Hemiplegia and hemiparesis following unspecified cerebrovascular disease affecting unspecified side: Secondary | ICD-10-CM | POA: Diagnosis not present

## 2012-02-26 DIAGNOSIS — S6390XA Sprain of unspecified part of unspecified wrist and hand, initial encounter: Secondary | ICD-10-CM | POA: Diagnosis not present

## 2012-02-26 DIAGNOSIS — R1310 Dysphagia, unspecified: Secondary | ICD-10-CM | POA: Diagnosis not present

## 2012-02-26 DIAGNOSIS — E785 Hyperlipidemia, unspecified: Secondary | ICD-10-CM | POA: Diagnosis not present

## 2012-03-15 DIAGNOSIS — M25539 Pain in unspecified wrist: Secondary | ICD-10-CM | POA: Diagnosis not present

## 2012-03-15 DIAGNOSIS — E7800 Pure hypercholesterolemia, unspecified: Secondary | ICD-10-CM | POA: Diagnosis not present

## 2012-03-15 DIAGNOSIS — M25519 Pain in unspecified shoulder: Secondary | ICD-10-CM | POA: Diagnosis not present

## 2012-03-15 DIAGNOSIS — M159 Polyosteoarthritis, unspecified: Secondary | ICD-10-CM | POA: Diagnosis not present

## 2012-03-15 DIAGNOSIS — B999 Unspecified infectious disease: Secondary | ICD-10-CM | POA: Diagnosis not present

## 2012-03-15 DIAGNOSIS — E785 Hyperlipidemia, unspecified: Secondary | ICD-10-CM | POA: Diagnosis not present

## 2012-03-15 DIAGNOSIS — I69959 Hemiplegia and hemiparesis following unspecified cerebrovascular disease affecting unspecified side: Secondary | ICD-10-CM | POA: Diagnosis not present

## 2012-03-15 DIAGNOSIS — M6281 Muscle weakness (generalized): Secondary | ICD-10-CM | POA: Diagnosis not present

## 2012-03-15 DIAGNOSIS — R269 Unspecified abnormalities of gait and mobility: Secondary | ICD-10-CM | POA: Diagnosis not present

## 2012-03-15 DIAGNOSIS — M545 Low back pain: Secondary | ICD-10-CM | POA: Diagnosis not present

## 2012-03-15 DIAGNOSIS — I10 Essential (primary) hypertension: Secondary | ICD-10-CM | POA: Diagnosis not present

## 2012-03-15 DIAGNOSIS — M25529 Pain in unspecified elbow: Secondary | ICD-10-CM | POA: Diagnosis not present

## 2012-03-15 DIAGNOSIS — M25549 Pain in joints of unspecified hand: Secondary | ICD-10-CM | POA: Diagnosis not present

## 2012-03-15 DIAGNOSIS — Z5189 Encounter for other specified aftercare: Secondary | ICD-10-CM | POA: Diagnosis not present

## 2012-03-15 DIAGNOSIS — M1990 Unspecified osteoarthritis, unspecified site: Secondary | ICD-10-CM | POA: Diagnosis not present

## 2012-03-15 DIAGNOSIS — G459 Transient cerebral ischemic attack, unspecified: Secondary | ICD-10-CM | POA: Diagnosis not present

## 2012-03-15 DIAGNOSIS — I6789 Other cerebrovascular disease: Secondary | ICD-10-CM | POA: Diagnosis not present

## 2012-03-15 DIAGNOSIS — I6990 Unspecified sequelae of unspecified cerebrovascular disease: Secondary | ICD-10-CM | POA: Diagnosis not present

## 2012-03-15 DIAGNOSIS — R1310 Dysphagia, unspecified: Secondary | ICD-10-CM | POA: Diagnosis not present

## 2012-03-20 ENCOUNTER — Ambulatory Visit: Payer: Medicare Other | Admitting: Family Medicine

## 2012-03-22 ENCOUNTER — Telehealth: Payer: Self-pay | Admitting: Family Medicine

## 2012-03-22 NOTE — Telephone Encounter (Signed)
Family member is calling because Karen Jackson has had a stroke and he wants to let Dr. Leveda Anna know.

## 2012-03-25 NOTE — Telephone Encounter (Signed)
Called and discussed.  I am aware that Karen Jackson had a hemorrhagic stroke ~ 1 month ago.  It is unclear why.  She is currently in rehab:  Room number is  [facility] She will likely be there for another month or two.  Meds were adjusted at hospital [place]. She will likely need home visit before she can resume regular office visits.   No blood thinners.  Apparently still on celebrex which I discouraged.  Also on Tramadol and try to maintain pain control with tramadol alone. Billey Gosling will send updated list of meds.  He would like to be called before I visit.  His cell is [phone]

## 2012-03-27 DIAGNOSIS — R269 Unspecified abnormalities of gait and mobility: Secondary | ICD-10-CM | POA: Diagnosis not present

## 2012-03-27 DIAGNOSIS — M25519 Pain in unspecified shoulder: Secondary | ICD-10-CM | POA: Diagnosis not present

## 2012-03-28 ENCOUNTER — Inpatient Hospital Stay: Payer: Medicare Other | Admitting: Family Medicine

## 2012-03-28 DIAGNOSIS — E7800 Pure hypercholesterolemia, unspecified: Secondary | ICD-10-CM | POA: Diagnosis not present

## 2012-03-28 DIAGNOSIS — I10 Essential (primary) hypertension: Secondary | ICD-10-CM | POA: Diagnosis not present

## 2012-03-28 DIAGNOSIS — I6990 Unspecified sequelae of unspecified cerebrovascular disease: Secondary | ICD-10-CM | POA: Diagnosis not present

## 2012-04-19 DIAGNOSIS — I69959 Hemiplegia and hemiparesis following unspecified cerebrovascular disease affecting unspecified side: Secondary | ICD-10-CM | POA: Diagnosis not present

## 2012-04-22 DIAGNOSIS — R269 Unspecified abnormalities of gait and mobility: Secondary | ICD-10-CM | POA: Diagnosis not present

## 2012-04-22 NOTE — Telephone Encounter (Signed)
Called and spoke to Karen Jackson.  She is still in rehab.  I will see this Friday 8/30 at around 10 AM.

## 2012-04-30 ENCOUNTER — Telehealth: Payer: Self-pay | Admitting: Family Medicine

## 2012-04-30 DIAGNOSIS — I619 Nontraumatic intracerebral hemorrhage, unspecified: Secondary | ICD-10-CM

## 2012-04-30 NOTE — Telephone Encounter (Signed)
Will forward to D Hensel

## 2012-04-30 NOTE — Telephone Encounter (Signed)
Seen on 8/30 at rehab.  Doing quite well.  Adjusted meds.  Tried to get off celebrex but call today indicates not tolerating.  Will give celebrex daily and use tramadol as prn.

## 2012-04-30 NOTE — Telephone Encounter (Signed)
Would like to change to Celebrex -   [facility]- [address]

## 2012-05-02 DIAGNOSIS — R269 Unspecified abnormalities of gait and mobility: Secondary | ICD-10-CM | POA: Diagnosis not present

## 2012-05-02 DIAGNOSIS — M6281 Muscle weakness (generalized): Secondary | ICD-10-CM | POA: Diagnosis not present

## 2012-05-30 ENCOUNTER — Encounter: Payer: Self-pay | Admitting: Home Health Services

## 2012-06-21 ENCOUNTER — Telehealth: Payer: Self-pay | Admitting: Family Medicine

## 2012-06-21 DIAGNOSIS — R269 Unspecified abnormalities of gait and mobility: Secondary | ICD-10-CM | POA: Diagnosis not present

## 2012-06-21 DIAGNOSIS — Z5189 Encounter for other specified aftercare: Secondary | ICD-10-CM | POA: Diagnosis not present

## 2012-06-21 DIAGNOSIS — I69959 Hemiplegia and hemiparesis following unspecified cerebrovascular disease affecting unspecified side: Secondary | ICD-10-CM | POA: Diagnosis not present

## 2012-06-21 DIAGNOSIS — M159 Polyosteoarthritis, unspecified: Secondary | ICD-10-CM | POA: Diagnosis not present

## 2012-06-21 DIAGNOSIS — I10 Essential (primary) hypertension: Secondary | ICD-10-CM | POA: Diagnosis not present

## 2012-06-21 DIAGNOSIS — Z9181 History of falling: Secondary | ICD-10-CM | POA: Diagnosis not present

## 2012-06-21 DIAGNOSIS — I69991 Dysphagia following unspecified cerebrovascular disease: Secondary | ICD-10-CM | POA: Diagnosis not present

## 2012-06-21 NOTE — Telephone Encounter (Signed)
Calling to inform provider they are seeing Karen Jackson for pt twice weekly starting next week.  Will be faxing over paperwork for provider to sign-off.

## 2012-06-24 ENCOUNTER — Telehealth: Payer: Self-pay | Admitting: Family Medicine

## 2012-06-24 DIAGNOSIS — M159 Polyosteoarthritis, unspecified: Secondary | ICD-10-CM | POA: Diagnosis not present

## 2012-06-24 DIAGNOSIS — I69959 Hemiplegia and hemiparesis following unspecified cerebrovascular disease affecting unspecified side: Secondary | ICD-10-CM | POA: Diagnosis not present

## 2012-06-24 DIAGNOSIS — Z5189 Encounter for other specified aftercare: Secondary | ICD-10-CM | POA: Diagnosis not present

## 2012-06-24 DIAGNOSIS — I10 Essential (primary) hypertension: Secondary | ICD-10-CM | POA: Diagnosis not present

## 2012-06-24 DIAGNOSIS — R269 Unspecified abnormalities of gait and mobility: Secondary | ICD-10-CM | POA: Diagnosis not present

## 2012-06-24 DIAGNOSIS — I69991 Dysphagia following unspecified cerebrovascular disease: Secondary | ICD-10-CM | POA: Diagnosis not present

## 2012-06-24 NOTE — Telephone Encounter (Signed)
I will be happy to sign when paperwork comes in.

## 2012-06-24 NOTE — Telephone Encounter (Signed)
Patient is calling for an order for a Super Hemi Wheelchair to go to [facility].

## 2012-06-25 NOTE — Telephone Encounter (Signed)
Karen Jackson was handling a similar order from another DME provider.  I am happy to authorize one wheelchair.

## 2012-06-26 NOTE — Telephone Encounter (Signed)
Called and spoke with Mrs. Ronne Binning.  I informed her that according to her discharge forms from [facility], that a wheelchair had been ordered through [facility].  I also left a message for Alinda Money at [facility] to call me so I can confirm a wheelchair has indeed been ordered. Mrs. Brenner states she is going to call them also.  Advised Mrs. Lyn if I needed to do anything further to get her a wheelchair, to call me back.  Ileana Ladd

## 2012-06-27 DIAGNOSIS — M159 Polyosteoarthritis, unspecified: Secondary | ICD-10-CM | POA: Diagnosis not present

## 2012-06-27 DIAGNOSIS — I69959 Hemiplegia and hemiparesis following unspecified cerebrovascular disease affecting unspecified side: Secondary | ICD-10-CM | POA: Diagnosis not present

## 2012-06-27 DIAGNOSIS — R269 Unspecified abnormalities of gait and mobility: Secondary | ICD-10-CM | POA: Diagnosis not present

## 2012-06-27 DIAGNOSIS — Z5189 Encounter for other specified aftercare: Secondary | ICD-10-CM | POA: Diagnosis not present

## 2012-06-27 DIAGNOSIS — I10 Essential (primary) hypertension: Secondary | ICD-10-CM | POA: Diagnosis not present

## 2012-06-27 DIAGNOSIS — I69991 Dysphagia following unspecified cerebrovascular disease: Secondary | ICD-10-CM | POA: Diagnosis not present

## 2012-06-28 ENCOUNTER — Telehealth: Payer: Self-pay | Admitting: Family Medicine

## 2012-06-28 MED ORDER — TRAZODONE HCL 50 MG PO TABS: 25.0000 mg | ORAL_TABLET | Freq: Every evening | ORAL | Status: DC | PRN

## 2012-06-28 MED ORDER — AMLODIPINE BESYLATE 10 MG PO TABS: 10.0000 mg | ORAL_TABLET | Freq: Every day | ORAL | Status: DC

## 2012-06-28 NOTE — Telephone Encounter (Signed)
Called and spoke with patient to confirm the medications below per Dr. Jennette Kettle. She had stated the same medications below but there may be some confusion on the patient end. I got pone number from [facility] and called medical records so that we can get a discharge medication sheet on this patient so that Dr. Jennette Kettle can fill this medication due to Dr. Leveda Anna out of office.  ~~waiting on fax

## 2012-06-28 NOTE — Telephone Encounter (Signed)
Dr. Leveda Anna A lot of confusion re her meds on d/c from [facility]--we finally got them faxed. Both Jadea and her caregiver swore shwe was on norvasc 20---after looking at her med list I wonder if someone put her on caduet 10-20. I decided just to send in the amlodipine--maybe you can straighten out Monday--I had them fax d/c meds and it is on your desk  Denny Levy

## 2012-06-28 NOTE — Telephone Encounter (Signed)
Pt has appt on 11/7 but needs this refilled today - Dr Leveda Anna assured them that he could do this. Needs refill on Trazadon 50mg  & Norvasc 20mg   [facility] [address]

## 2012-06-28 NOTE — Telephone Encounter (Signed)
Dear [facility] Asters Team Please call and see exactly what medicines they are requesting--I do not see either trazodone OR norvasc on her current or (history) med list. Lynford Humphrey! Denny Levy

## 2012-06-28 NOTE — Telephone Encounter (Signed)
Received call from Lacey at [facility].  They did not order Karen Jackson a wheelchair from [employer] like stated on her discharge instructions.   He advised Karen Jackson to schedule an appointment with her PCP to do a face to face so a wheelchair can be ordered through [facility].  I spoke with Debbie at [facility] to see what they would need to be done at Karen Jackson's appointment scheduled for 07/04/2012.  They advised we cancel her appointment with Dr. Leveda Anna on Thursday because Karen Jackson would need to be scheduled at [facility] for a wheelchair evaluation first.  When I called to advise Karen Jackson of this, she told me she had talked to the physical therapist from [facility] who told her to just keep the wheelchair she was sent home with. So at this point we will not proceed with the wheelchair evaluation for [facility] but Karen Jackson would like to keep her appointment with Dr. Leveda Anna on Thursday so she can get her medications refilled.   Debbie with [facility] would still like to call patient and talk to Karen Jackson about her wheelchair options, just in case [employer] decides they want their wheelchair back.    I told Eunice Blase I thought that would be fine.  Ileana Ladd

## 2012-06-29 DIAGNOSIS — Z5189 Encounter for other specified aftercare: Secondary | ICD-10-CM | POA: Diagnosis not present

## 2012-06-29 DIAGNOSIS — M159 Polyosteoarthritis, unspecified: Secondary | ICD-10-CM | POA: Diagnosis not present

## 2012-06-29 DIAGNOSIS — I69959 Hemiplegia and hemiparesis following unspecified cerebrovascular disease affecting unspecified side: Secondary | ICD-10-CM | POA: Diagnosis not present

## 2012-06-29 DIAGNOSIS — I69991 Dysphagia following unspecified cerebrovascular disease: Secondary | ICD-10-CM | POA: Diagnosis not present

## 2012-06-29 DIAGNOSIS — R269 Unspecified abnormalities of gait and mobility: Secondary | ICD-10-CM | POA: Diagnosis not present

## 2012-06-29 DIAGNOSIS — I10 Essential (primary) hypertension: Secondary | ICD-10-CM | POA: Diagnosis not present

## 2012-07-01 DIAGNOSIS — R269 Unspecified abnormalities of gait and mobility: Secondary | ICD-10-CM | POA: Diagnosis not present

## 2012-07-01 DIAGNOSIS — Z5189 Encounter for other specified aftercare: Secondary | ICD-10-CM | POA: Diagnosis not present

## 2012-07-01 DIAGNOSIS — I10 Essential (primary) hypertension: Secondary | ICD-10-CM | POA: Diagnosis not present

## 2012-07-01 DIAGNOSIS — I69991 Dysphagia following unspecified cerebrovascular disease: Secondary | ICD-10-CM | POA: Diagnosis not present

## 2012-07-01 DIAGNOSIS — M159 Polyosteoarthritis, unspecified: Secondary | ICD-10-CM | POA: Diagnosis not present

## 2012-07-01 DIAGNOSIS — I69959 Hemiplegia and hemiparesis following unspecified cerebrovascular disease affecting unspecified side: Secondary | ICD-10-CM | POA: Diagnosis not present

## 2012-07-01 NOTE — Telephone Encounter (Signed)
Reviewed and agree with management

## 2012-07-02 DIAGNOSIS — I69991 Dysphagia following unspecified cerebrovascular disease: Secondary | ICD-10-CM | POA: Diagnosis not present

## 2012-07-02 DIAGNOSIS — M159 Polyosteoarthritis, unspecified: Secondary | ICD-10-CM | POA: Diagnosis not present

## 2012-07-02 DIAGNOSIS — I10 Essential (primary) hypertension: Secondary | ICD-10-CM | POA: Diagnosis not present

## 2012-07-02 DIAGNOSIS — R269 Unspecified abnormalities of gait and mobility: Secondary | ICD-10-CM | POA: Diagnosis not present

## 2012-07-02 DIAGNOSIS — I69959 Hemiplegia and hemiparesis following unspecified cerebrovascular disease affecting unspecified side: Secondary | ICD-10-CM | POA: Diagnosis not present

## 2012-07-02 DIAGNOSIS — Z5189 Encounter for other specified aftercare: Secondary | ICD-10-CM | POA: Diagnosis not present

## 2012-07-03 DIAGNOSIS — I69991 Dysphagia following unspecified cerebrovascular disease: Secondary | ICD-10-CM | POA: Diagnosis not present

## 2012-07-03 DIAGNOSIS — R269 Unspecified abnormalities of gait and mobility: Secondary | ICD-10-CM | POA: Diagnosis not present

## 2012-07-03 DIAGNOSIS — M159 Polyosteoarthritis, unspecified: Secondary | ICD-10-CM | POA: Diagnosis not present

## 2012-07-03 DIAGNOSIS — I69959 Hemiplegia and hemiparesis following unspecified cerebrovascular disease affecting unspecified side: Secondary | ICD-10-CM | POA: Diagnosis not present

## 2012-07-03 DIAGNOSIS — Z5189 Encounter for other specified aftercare: Secondary | ICD-10-CM | POA: Diagnosis not present

## 2012-07-03 DIAGNOSIS — I10 Essential (primary) hypertension: Secondary | ICD-10-CM | POA: Diagnosis not present

## 2012-07-04 ENCOUNTER — Ambulatory Visit (INDEPENDENT_AMBULATORY_CARE_PROVIDER_SITE_OTHER): Payer: Medicare Other | Admitting: Family Medicine

## 2012-07-04 ENCOUNTER — Encounter: Payer: Self-pay | Admitting: Family Medicine

## 2012-07-04 VITALS — BP 138/61 | HR 73 | Temp 98.1°F | Ht <= 58 in | Wt 162.0 lb

## 2012-07-04 DIAGNOSIS — I619 Nontraumatic intracerebral hemorrhage, unspecified: Secondary | ICD-10-CM

## 2012-07-04 DIAGNOSIS — I10 Essential (primary) hypertension: Secondary | ICD-10-CM | POA: Diagnosis not present

## 2012-07-04 DIAGNOSIS — K219 Gastro-esophageal reflux disease without esophagitis: Secondary | ICD-10-CM | POA: Diagnosis not present

## 2012-07-04 DIAGNOSIS — I69991 Dysphagia following unspecified cerebrovascular disease: Secondary | ICD-10-CM | POA: Diagnosis not present

## 2012-07-04 DIAGNOSIS — M159 Polyosteoarthritis, unspecified: Secondary | ICD-10-CM | POA: Diagnosis not present

## 2012-07-04 DIAGNOSIS — I69959 Hemiplegia and hemiparesis following unspecified cerebrovascular disease affecting unspecified side: Secondary | ICD-10-CM | POA: Diagnosis not present

## 2012-07-04 DIAGNOSIS — R269 Unspecified abnormalities of gait and mobility: Secondary | ICD-10-CM | POA: Diagnosis not present

## 2012-07-04 DIAGNOSIS — Z5189 Encounter for other specified aftercare: Secondary | ICD-10-CM | POA: Diagnosis not present

## 2012-07-04 LAB — BASIC METABOLIC PANEL
Calcium: 9.9 mg/dL (ref 8.4–10.5)
Glucose, Bld: 128 mg/dL — ABNORMAL HIGH (ref 70–99)
Potassium: 3.7 mEq/L (ref 3.5–5.3)
Sodium: 137 mEq/L (ref 135–145)

## 2012-07-04 LAB — MAGNESIUM: Magnesium: 1.9 mg/dL (ref 1.5–2.5)

## 2012-07-04 MED ORDER — GABAPENTIN 100 MG PO CAPS: 100.0000 mg | ORAL_CAPSULE | Freq: Three times a day (TID) | ORAL | Status: DC

## 2012-07-04 MED ORDER — OMEPRAZOLE 20 MG PO CPDR: 20.0000 mg | DELAYED_RELEASE_CAPSULE | Freq: Every day | ORAL | Status: DC

## 2012-07-04 MED ORDER — POTASSIUM CHLORIDE ER 10 MEQ PO CPCR: 10.0000 meq | ORAL_CAPSULE | Freq: Two times a day (BID) | ORAL | Status: DC

## 2012-07-04 MED ORDER — HYDROCHLOROTHIAZIDE 25 MG PO TABS: 25.0000 mg | ORAL_TABLET | Freq: Every day | ORAL | Status: DC

## 2012-07-04 MED ORDER — MAGNESIUM OXIDE 400 MG PO TABS: 400.0000 mg | ORAL_TABLET | Freq: Every day | ORAL | Status: DC

## 2012-07-04 MED ORDER — ATORVASTATIN CALCIUM 20 MG PO TABS: 20.0000 mg | ORAL_TABLET | Freq: Every day | ORAL | Status: DC

## 2012-07-04 NOTE — Patient Instructions (Addendum)
Stay in touch. The nurse will talk about how to get [facility]. Sign up for My Chart.  Let me know if I need to order any more equipment. See me in three months if things are going well

## 2012-07-06 DIAGNOSIS — R269 Unspecified abnormalities of gait and mobility: Secondary | ICD-10-CM | POA: Diagnosis not present

## 2012-07-06 DIAGNOSIS — I69991 Dysphagia following unspecified cerebrovascular disease: Secondary | ICD-10-CM | POA: Diagnosis not present

## 2012-07-06 DIAGNOSIS — Z5189 Encounter for other specified aftercare: Secondary | ICD-10-CM | POA: Diagnosis not present

## 2012-07-06 DIAGNOSIS — M159 Polyosteoarthritis, unspecified: Secondary | ICD-10-CM | POA: Diagnosis not present

## 2012-07-06 DIAGNOSIS — I69959 Hemiplegia and hemiparesis following unspecified cerebrovascular disease affecting unspecified side: Secondary | ICD-10-CM | POA: Diagnosis not present

## 2012-07-06 DIAGNOSIS — I10 Essential (primary) hypertension: Secondary | ICD-10-CM | POA: Diagnosis not present

## 2012-07-08 DIAGNOSIS — I69991 Dysphagia following unspecified cerebrovascular disease: Secondary | ICD-10-CM | POA: Diagnosis not present

## 2012-07-08 DIAGNOSIS — I69959 Hemiplegia and hemiparesis following unspecified cerebrovascular disease affecting unspecified side: Secondary | ICD-10-CM | POA: Diagnosis not present

## 2012-07-08 DIAGNOSIS — M159 Polyosteoarthritis, unspecified: Secondary | ICD-10-CM | POA: Diagnosis not present

## 2012-07-08 DIAGNOSIS — Z5189 Encounter for other specified aftercare: Secondary | ICD-10-CM | POA: Diagnosis not present

## 2012-07-08 DIAGNOSIS — I10 Essential (primary) hypertension: Secondary | ICD-10-CM | POA: Diagnosis not present

## 2012-07-08 DIAGNOSIS — R269 Unspecified abnormalities of gait and mobility: Secondary | ICD-10-CM | POA: Diagnosis not present

## 2012-07-08 NOTE — Assessment & Plan Note (Signed)
Well controled on PPI.  Was not well controled on H2 blocker.

## 2012-07-08 NOTE — Assessment & Plan Note (Signed)
Still with dense left hemiparesis 4-5 months later.  She will likely not recover much more.

## 2012-07-08 NOTE — Assessment & Plan Note (Signed)
Good control

## 2012-07-08 NOTE — Progress Notes (Signed)
  Subjective:    Patient ID: SHA BURLING, female    DOB: 1941/01/11, 71 y.o.   MRN: [id number]  HPI First visit to office since hemorrhagic CVA.  I had seen while in rehab.  She is now home with 24 hour caregivers.  She has a left hemiparesis that is pretty dense.  Has not affected affect or mentation.  Needs wheelchair, which I have ordered.  Also needs 3 in 1 chair and I wrote a prescription.  Her medical problems are under good control.  The concern is that this CVA was unanticipated and I really can't find anything to change.  We discussed and I acknowledged that I cannot predict the chance of a second bleed.  She knows not to take ASA.  I have previously tried off celebrex and on ultram, but she had poor therapeutic response.  She went back on celebrex recognizing the small but real risks.      Review of Systems     Objective:   Physical Exam Dense left hemiparesis Normal mentation and affect Cardiac RRR without m or g Lungs clear. Wt loss noted.        Assessment & Plan:

## 2012-07-09 DIAGNOSIS — I10 Essential (primary) hypertension: Secondary | ICD-10-CM | POA: Diagnosis not present

## 2012-07-09 DIAGNOSIS — I69991 Dysphagia following unspecified cerebrovascular disease: Secondary | ICD-10-CM | POA: Diagnosis not present

## 2012-07-09 DIAGNOSIS — R269 Unspecified abnormalities of gait and mobility: Secondary | ICD-10-CM | POA: Diagnosis not present

## 2012-07-09 DIAGNOSIS — Z5189 Encounter for other specified aftercare: Secondary | ICD-10-CM | POA: Diagnosis not present

## 2012-07-09 DIAGNOSIS — I69959 Hemiplegia and hemiparesis following unspecified cerebrovascular disease affecting unspecified side: Secondary | ICD-10-CM | POA: Diagnosis not present

## 2012-07-09 DIAGNOSIS — M159 Polyosteoarthritis, unspecified: Secondary | ICD-10-CM | POA: Diagnosis not present

## 2012-07-10 DIAGNOSIS — M159 Polyosteoarthritis, unspecified: Secondary | ICD-10-CM | POA: Diagnosis not present

## 2012-07-10 DIAGNOSIS — Z5189 Encounter for other specified aftercare: Secondary | ICD-10-CM | POA: Diagnosis not present

## 2012-07-10 DIAGNOSIS — I10 Essential (primary) hypertension: Secondary | ICD-10-CM | POA: Diagnosis not present

## 2012-07-10 DIAGNOSIS — R269 Unspecified abnormalities of gait and mobility: Secondary | ICD-10-CM | POA: Diagnosis not present

## 2012-07-10 DIAGNOSIS — I69991 Dysphagia following unspecified cerebrovascular disease: Secondary | ICD-10-CM | POA: Diagnosis not present

## 2012-07-10 DIAGNOSIS — I69959 Hemiplegia and hemiparesis following unspecified cerebrovascular disease affecting unspecified side: Secondary | ICD-10-CM | POA: Diagnosis not present

## 2012-07-11 DIAGNOSIS — I10 Essential (primary) hypertension: Secondary | ICD-10-CM | POA: Diagnosis not present

## 2012-07-11 DIAGNOSIS — I69959 Hemiplegia and hemiparesis following unspecified cerebrovascular disease affecting unspecified side: Secondary | ICD-10-CM | POA: Diagnosis not present

## 2012-07-11 DIAGNOSIS — Z5189 Encounter for other specified aftercare: Secondary | ICD-10-CM | POA: Diagnosis not present

## 2012-07-11 DIAGNOSIS — M159 Polyosteoarthritis, unspecified: Secondary | ICD-10-CM | POA: Diagnosis not present

## 2012-07-11 DIAGNOSIS — I69991 Dysphagia following unspecified cerebrovascular disease: Secondary | ICD-10-CM | POA: Diagnosis not present

## 2012-07-11 DIAGNOSIS — R269 Unspecified abnormalities of gait and mobility: Secondary | ICD-10-CM | POA: Diagnosis not present

## 2012-07-12 DIAGNOSIS — I69991 Dysphagia following unspecified cerebrovascular disease: Secondary | ICD-10-CM | POA: Diagnosis not present

## 2012-07-12 DIAGNOSIS — I69959 Hemiplegia and hemiparesis following unspecified cerebrovascular disease affecting unspecified side: Secondary | ICD-10-CM | POA: Diagnosis not present

## 2012-07-12 DIAGNOSIS — R269 Unspecified abnormalities of gait and mobility: Secondary | ICD-10-CM | POA: Diagnosis not present

## 2012-07-12 DIAGNOSIS — M159 Polyosteoarthritis, unspecified: Secondary | ICD-10-CM | POA: Diagnosis not present

## 2012-07-12 DIAGNOSIS — Z5189 Encounter for other specified aftercare: Secondary | ICD-10-CM | POA: Diagnosis not present

## 2012-07-12 DIAGNOSIS — I10 Essential (primary) hypertension: Secondary | ICD-10-CM | POA: Diagnosis not present

## 2012-07-15 DIAGNOSIS — R269 Unspecified abnormalities of gait and mobility: Secondary | ICD-10-CM | POA: Diagnosis not present

## 2012-07-15 DIAGNOSIS — I69991 Dysphagia following unspecified cerebrovascular disease: Secondary | ICD-10-CM | POA: Diagnosis not present

## 2012-07-15 DIAGNOSIS — I10 Essential (primary) hypertension: Secondary | ICD-10-CM | POA: Diagnosis not present

## 2012-07-15 DIAGNOSIS — I69959 Hemiplegia and hemiparesis following unspecified cerebrovascular disease affecting unspecified side: Secondary | ICD-10-CM | POA: Diagnosis not present

## 2012-07-15 DIAGNOSIS — Z5189 Encounter for other specified aftercare: Secondary | ICD-10-CM | POA: Diagnosis not present

## 2012-07-15 DIAGNOSIS — M159 Polyosteoarthritis, unspecified: Secondary | ICD-10-CM | POA: Diagnosis not present

## 2012-07-16 DIAGNOSIS — I69991 Dysphagia following unspecified cerebrovascular disease: Secondary | ICD-10-CM | POA: Diagnosis not present

## 2012-07-16 DIAGNOSIS — I10 Essential (primary) hypertension: Secondary | ICD-10-CM | POA: Diagnosis not present

## 2012-07-16 DIAGNOSIS — M159 Polyosteoarthritis, unspecified: Secondary | ICD-10-CM | POA: Diagnosis not present

## 2012-07-16 DIAGNOSIS — R269 Unspecified abnormalities of gait and mobility: Secondary | ICD-10-CM | POA: Diagnosis not present

## 2012-07-16 DIAGNOSIS — Z5189 Encounter for other specified aftercare: Secondary | ICD-10-CM | POA: Diagnosis not present

## 2012-07-16 DIAGNOSIS — I69959 Hemiplegia and hemiparesis following unspecified cerebrovascular disease affecting unspecified side: Secondary | ICD-10-CM | POA: Diagnosis not present

## 2012-07-18 DIAGNOSIS — Z5189 Encounter for other specified aftercare: Secondary | ICD-10-CM | POA: Diagnosis not present

## 2012-07-18 DIAGNOSIS — M159 Polyosteoarthritis, unspecified: Secondary | ICD-10-CM | POA: Diagnosis not present

## 2012-07-18 DIAGNOSIS — R269 Unspecified abnormalities of gait and mobility: Secondary | ICD-10-CM | POA: Diagnosis not present

## 2012-07-18 DIAGNOSIS — I69991 Dysphagia following unspecified cerebrovascular disease: Secondary | ICD-10-CM | POA: Diagnosis not present

## 2012-07-18 DIAGNOSIS — I69959 Hemiplegia and hemiparesis following unspecified cerebrovascular disease affecting unspecified side: Secondary | ICD-10-CM | POA: Diagnosis not present

## 2012-07-18 DIAGNOSIS — I10 Essential (primary) hypertension: Secondary | ICD-10-CM | POA: Diagnosis not present

## 2012-07-19 DIAGNOSIS — R269 Unspecified abnormalities of gait and mobility: Secondary | ICD-10-CM | POA: Diagnosis not present

## 2012-07-19 DIAGNOSIS — I69959 Hemiplegia and hemiparesis following unspecified cerebrovascular disease affecting unspecified side: Secondary | ICD-10-CM | POA: Diagnosis not present

## 2012-07-19 DIAGNOSIS — Z5189 Encounter for other specified aftercare: Secondary | ICD-10-CM | POA: Diagnosis not present

## 2012-07-19 DIAGNOSIS — M159 Polyosteoarthritis, unspecified: Secondary | ICD-10-CM | POA: Diagnosis not present

## 2012-07-19 DIAGNOSIS — I10 Essential (primary) hypertension: Secondary | ICD-10-CM | POA: Diagnosis not present

## 2012-07-19 DIAGNOSIS — I69991 Dysphagia following unspecified cerebrovascular disease: Secondary | ICD-10-CM | POA: Diagnosis not present

## 2012-07-22 DIAGNOSIS — R269 Unspecified abnormalities of gait and mobility: Secondary | ICD-10-CM | POA: Diagnosis not present

## 2012-07-22 DIAGNOSIS — I69991 Dysphagia following unspecified cerebrovascular disease: Secondary | ICD-10-CM | POA: Diagnosis not present

## 2012-07-22 DIAGNOSIS — M159 Polyosteoarthritis, unspecified: Secondary | ICD-10-CM | POA: Diagnosis not present

## 2012-07-22 DIAGNOSIS — I69959 Hemiplegia and hemiparesis following unspecified cerebrovascular disease affecting unspecified side: Secondary | ICD-10-CM | POA: Diagnosis not present

## 2012-07-22 DIAGNOSIS — Z5189 Encounter for other specified aftercare: Secondary | ICD-10-CM | POA: Diagnosis not present

## 2012-07-22 DIAGNOSIS — I10 Essential (primary) hypertension: Secondary | ICD-10-CM | POA: Diagnosis not present

## 2012-07-29 ENCOUNTER — Ambulatory Visit: Payer: Medicare Other | Attending: Family Medicine | Admitting: Physical Therapy

## 2012-07-29 DIAGNOSIS — R262 Difficulty in walking, not elsewhere classified: Secondary | ICD-10-CM | POA: Insufficient documentation

## 2012-07-29 DIAGNOSIS — I69998 Other sequelae following unspecified cerebrovascular disease: Secondary | ICD-10-CM | POA: Insufficient documentation

## 2012-07-29 DIAGNOSIS — IMO0001 Reserved for inherently not codable concepts without codable children: Secondary | ICD-10-CM | POA: Insufficient documentation

## 2012-07-29 DIAGNOSIS — R5381 Other malaise: Secondary | ICD-10-CM | POA: Diagnosis not present

## 2012-07-29 DIAGNOSIS — M6281 Muscle weakness (generalized): Secondary | ICD-10-CM | POA: Insufficient documentation

## 2012-07-31 ENCOUNTER — Ambulatory Visit: Payer: Medicare Other | Admitting: Physical Therapy

## 2012-08-02 ENCOUNTER — Ambulatory Visit: Payer: Medicare Other | Admitting: Physical Therapy

## 2012-08-05 ENCOUNTER — Ambulatory Visit: Payer: Medicare Other | Admitting: Physical Therapy

## 2012-08-07 ENCOUNTER — Ambulatory Visit: Payer: Medicare Other | Admitting: Physical Therapy

## 2012-08-09 ENCOUNTER — Ambulatory Visit: Payer: Medicare Other | Admitting: Physical Therapy

## 2012-08-12 ENCOUNTER — Ambulatory Visit: Payer: Medicare Other | Admitting: Physical Therapy

## 2012-08-14 ENCOUNTER — Ambulatory Visit: Payer: Medicare Other | Admitting: Physical Therapy

## 2012-08-16 ENCOUNTER — Ambulatory Visit: Payer: Medicare Other | Admitting: Physical Therapy

## 2012-08-19 ENCOUNTER — Ambulatory Visit: Payer: Medicare Other | Admitting: Physical Therapy

## 2012-08-22 ENCOUNTER — Ambulatory Visit: Payer: Medicare Other | Admitting: Physical Therapy

## 2012-08-23 ENCOUNTER — Other Ambulatory Visit: Payer: Self-pay | Admitting: Family Medicine

## 2012-08-27 ENCOUNTER — Ambulatory Visit: Payer: Medicare Other | Admitting: Physical Therapy

## 2012-08-29 ENCOUNTER — Ambulatory Visit: Payer: Medicare Other | Attending: Family Medicine | Admitting: Physical Therapy

## 2012-08-29 DIAGNOSIS — R262 Difficulty in walking, not elsewhere classified: Secondary | ICD-10-CM | POA: Diagnosis not present

## 2012-08-29 DIAGNOSIS — IMO0001 Reserved for inherently not codable concepts without codable children: Secondary | ICD-10-CM | POA: Diagnosis not present

## 2012-08-29 DIAGNOSIS — R5381 Other malaise: Secondary | ICD-10-CM | POA: Diagnosis not present

## 2012-08-29 DIAGNOSIS — M6281 Muscle weakness (generalized): Secondary | ICD-10-CM | POA: Diagnosis not present

## 2012-08-29 DIAGNOSIS — I69998 Other sequelae following unspecified cerebrovascular disease: Secondary | ICD-10-CM | POA: Insufficient documentation

## 2012-09-02 ENCOUNTER — Ambulatory Visit: Payer: Medicare Other | Admitting: Physical Therapy

## 2012-09-04 ENCOUNTER — Ambulatory Visit: Payer: Medicare Other | Admitting: Physical Therapy

## 2012-09-05 ENCOUNTER — Telehealth: Payer: Self-pay | Admitting: Family Medicine

## 2012-09-05 NOTE — Telephone Encounter (Signed)
Would like to get a rollator 4 wheel walker - needs script to go to [facility]

## 2012-09-06 ENCOUNTER — Ambulatory Visit: Payer: Medicare Other | Admitting: Physical Therapy

## 2012-09-06 NOTE — Telephone Encounter (Signed)
Order faxed to [facility] [phone].  Ileana Ladd

## 2012-09-06 NOTE — Telephone Encounter (Signed)
Handwritten Rx placed on [name]'s desk to be faxed

## 2012-09-06 NOTE — Telephone Encounter (Signed)
This will have to be written and faxed in with dx code.  I can't call this in.  Sorry. Ileana Ladd

## 2012-09-06 NOTE — Telephone Encounter (Signed)
Agree this is medically appropriate.  She needs because CVA has left her with hemiparesis.

## 2012-09-09 ENCOUNTER — Ambulatory Visit: Payer: Medicare Other | Admitting: Physical Therapy

## 2012-09-11 ENCOUNTER — Ambulatory Visit: Payer: Medicare Other | Admitting: Physical Therapy

## 2012-09-12 DIAGNOSIS — H9110 Presbycusis, unspecified ear: Secondary | ICD-10-CM | POA: Diagnosis not present

## 2012-09-12 DIAGNOSIS — H6120 Impacted cerumen, unspecified ear: Secondary | ICD-10-CM | POA: Diagnosis not present

## 2012-09-12 DIAGNOSIS — H903 Sensorineural hearing loss, bilateral: Secondary | ICD-10-CM | POA: Diagnosis not present

## 2012-09-13 ENCOUNTER — Ambulatory Visit: Payer: Medicare Other | Admitting: Physical Therapy

## 2012-09-16 ENCOUNTER — Ambulatory Visit: Payer: Medicare Other | Admitting: Physical Therapy

## 2012-09-18 ENCOUNTER — Ambulatory Visit: Payer: Medicare Other | Admitting: Physical Therapy

## 2012-09-20 ENCOUNTER — Ambulatory Visit: Payer: Medicare Other | Admitting: Physical Therapy

## 2012-09-23 ENCOUNTER — Ambulatory Visit: Payer: Medicare Other | Admitting: Physical Therapy

## 2012-09-25 ENCOUNTER — Ambulatory Visit: Payer: Medicare Other | Admitting: Physical Therapy

## 2012-09-26 ENCOUNTER — Other Ambulatory Visit: Payer: Self-pay | Admitting: Family Medicine

## 2012-09-27 ENCOUNTER — Ambulatory Visit: Payer: Medicare Other | Admitting: Physical Therapy

## 2012-09-30 ENCOUNTER — Ambulatory Visit: Payer: Medicare Other | Attending: Family Medicine | Admitting: Physical Therapy

## 2012-09-30 DIAGNOSIS — R5381 Other malaise: Secondary | ICD-10-CM | POA: Insufficient documentation

## 2012-09-30 DIAGNOSIS — R262 Difficulty in walking, not elsewhere classified: Secondary | ICD-10-CM | POA: Diagnosis not present

## 2012-09-30 DIAGNOSIS — I69998 Other sequelae following unspecified cerebrovascular disease: Secondary | ICD-10-CM | POA: Insufficient documentation

## 2012-09-30 DIAGNOSIS — M6281 Muscle weakness (generalized): Secondary | ICD-10-CM | POA: Diagnosis not present

## 2012-09-30 DIAGNOSIS — IMO0001 Reserved for inherently not codable concepts without codable children: Secondary | ICD-10-CM | POA: Insufficient documentation

## 2012-10-02 ENCOUNTER — Ambulatory Visit: Payer: Medicare Other | Admitting: Physical Therapy

## 2012-10-04 ENCOUNTER — Ambulatory Visit: Payer: Medicare Other | Admitting: Physical Therapy

## 2012-10-07 ENCOUNTER — Ambulatory Visit: Payer: Medicare Other | Admitting: Physical Therapy

## 2012-10-09 ENCOUNTER — Ambulatory Visit: Payer: Medicare Other | Admitting: Physical Therapy

## 2012-10-11 ENCOUNTER — Ambulatory Visit: Payer: Medicare Other | Admitting: Physical Therapy

## 2012-10-14 ENCOUNTER — Ambulatory Visit: Payer: Medicare Other | Admitting: Physical Therapy

## 2012-10-16 ENCOUNTER — Ambulatory Visit: Payer: Medicare Other | Admitting: Physical Therapy

## 2012-10-18 ENCOUNTER — Ambulatory Visit: Payer: Medicare Other | Admitting: Physical Therapy

## 2012-10-21 ENCOUNTER — Ambulatory Visit: Payer: Medicare Other | Admitting: Physical Therapy

## 2012-10-23 ENCOUNTER — Ambulatory Visit: Payer: Medicare Other | Admitting: Physical Therapy

## 2012-10-25 ENCOUNTER — Ambulatory Visit: Payer: Medicare Other | Admitting: Physical Therapy

## 2012-10-28 ENCOUNTER — Ambulatory Visit: Payer: Medicare Other | Attending: Family Medicine | Admitting: Physical Therapy

## 2012-10-28 DIAGNOSIS — R5381 Other malaise: Secondary | ICD-10-CM | POA: Insufficient documentation

## 2012-10-28 DIAGNOSIS — R262 Difficulty in walking, not elsewhere classified: Secondary | ICD-10-CM | POA: Insufficient documentation

## 2012-10-28 DIAGNOSIS — M6281 Muscle weakness (generalized): Secondary | ICD-10-CM | POA: Insufficient documentation

## 2012-10-28 DIAGNOSIS — I69998 Other sequelae following unspecified cerebrovascular disease: Secondary | ICD-10-CM | POA: Diagnosis not present

## 2012-10-28 DIAGNOSIS — IMO0001 Reserved for inherently not codable concepts without codable children: Secondary | ICD-10-CM | POA: Diagnosis not present

## 2012-10-30 ENCOUNTER — Ambulatory Visit: Payer: Medicare Other | Admitting: Physical Therapy

## 2012-11-01 ENCOUNTER — Ambulatory Visit: Payer: Medicare Other | Admitting: Physical Therapy

## 2012-11-04 ENCOUNTER — Ambulatory Visit: Payer: Medicare Other | Admitting: Physical Therapy

## 2012-11-06 ENCOUNTER — Ambulatory Visit: Payer: Medicare Other | Admitting: Physical Therapy

## 2012-11-08 ENCOUNTER — Ambulatory Visit: Payer: Medicare Other | Admitting: Physical Therapy

## 2012-11-11 ENCOUNTER — Ambulatory Visit: Payer: Medicare Other | Admitting: Physical Therapy

## 2012-11-13 ENCOUNTER — Ambulatory Visit: Payer: Medicare Other | Admitting: Physical Therapy

## 2012-11-15 ENCOUNTER — Ambulatory Visit: Payer: Medicare Other | Admitting: Physical Therapy

## 2012-11-18 ENCOUNTER — Ambulatory Visit: Payer: Medicare Other | Admitting: Physical Therapy

## 2012-11-20 ENCOUNTER — Ambulatory Visit: Payer: Medicare Other | Admitting: Physical Therapy

## 2012-11-21 ENCOUNTER — Other Ambulatory Visit: Payer: Self-pay | Admitting: Family Medicine

## 2012-11-22 ENCOUNTER — Ambulatory Visit: Payer: Medicare Other | Admitting: Physical Therapy

## 2012-11-25 ENCOUNTER — Ambulatory Visit: Payer: Medicare Other | Admitting: Physical Therapy

## 2012-11-27 ENCOUNTER — Ambulatory Visit: Payer: Medicare Other | Attending: Family Medicine | Admitting: Physical Therapy

## 2012-11-27 DIAGNOSIS — I69998 Other sequelae following unspecified cerebrovascular disease: Secondary | ICD-10-CM | POA: Insufficient documentation

## 2012-11-27 DIAGNOSIS — R5381 Other malaise: Secondary | ICD-10-CM | POA: Insufficient documentation

## 2012-11-27 DIAGNOSIS — M6281 Muscle weakness (generalized): Secondary | ICD-10-CM | POA: Insufficient documentation

## 2012-11-27 DIAGNOSIS — R262 Difficulty in walking, not elsewhere classified: Secondary | ICD-10-CM | POA: Insufficient documentation

## 2012-11-27 DIAGNOSIS — IMO0001 Reserved for inherently not codable concepts without codable children: Secondary | ICD-10-CM | POA: Insufficient documentation

## 2012-11-29 ENCOUNTER — Ambulatory Visit: Payer: Medicare Other | Admitting: Physical Therapy

## 2012-11-29 DIAGNOSIS — R5381 Other malaise: Secondary | ICD-10-CM | POA: Diagnosis not present

## 2012-11-29 DIAGNOSIS — I69998 Other sequelae following unspecified cerebrovascular disease: Secondary | ICD-10-CM | POA: Diagnosis not present

## 2012-11-29 DIAGNOSIS — IMO0001 Reserved for inherently not codable concepts without codable children: Secondary | ICD-10-CM | POA: Diagnosis not present

## 2012-11-29 DIAGNOSIS — M6281 Muscle weakness (generalized): Secondary | ICD-10-CM | POA: Diagnosis not present

## 2012-11-29 DIAGNOSIS — R262 Difficulty in walking, not elsewhere classified: Secondary | ICD-10-CM | POA: Diagnosis not present

## 2012-12-02 ENCOUNTER — Ambulatory Visit: Payer: Medicare Other | Admitting: Physical Therapy

## 2012-12-02 DIAGNOSIS — R5381 Other malaise: Secondary | ICD-10-CM | POA: Diagnosis not present

## 2012-12-02 DIAGNOSIS — I69998 Other sequelae following unspecified cerebrovascular disease: Secondary | ICD-10-CM | POA: Diagnosis not present

## 2012-12-02 DIAGNOSIS — IMO0001 Reserved for inherently not codable concepts without codable children: Secondary | ICD-10-CM | POA: Diagnosis not present

## 2012-12-02 DIAGNOSIS — M6281 Muscle weakness (generalized): Secondary | ICD-10-CM | POA: Diagnosis not present

## 2012-12-02 DIAGNOSIS — R262 Difficulty in walking, not elsewhere classified: Secondary | ICD-10-CM | POA: Diagnosis not present

## 2012-12-04 ENCOUNTER — Ambulatory Visit: Payer: Medicare Other | Admitting: Physical Therapy

## 2012-12-04 DIAGNOSIS — R262 Difficulty in walking, not elsewhere classified: Secondary | ICD-10-CM | POA: Diagnosis not present

## 2012-12-04 DIAGNOSIS — M6281 Muscle weakness (generalized): Secondary | ICD-10-CM | POA: Diagnosis not present

## 2012-12-04 DIAGNOSIS — R5381 Other malaise: Secondary | ICD-10-CM | POA: Diagnosis not present

## 2012-12-04 DIAGNOSIS — IMO0001 Reserved for inherently not codable concepts without codable children: Secondary | ICD-10-CM | POA: Diagnosis not present

## 2012-12-04 DIAGNOSIS — I69998 Other sequelae following unspecified cerebrovascular disease: Secondary | ICD-10-CM | POA: Diagnosis not present

## 2012-12-06 ENCOUNTER — Ambulatory Visit: Payer: Medicare Other | Admitting: Physical Therapy

## 2012-12-06 DIAGNOSIS — IMO0001 Reserved for inherently not codable concepts without codable children: Secondary | ICD-10-CM | POA: Diagnosis not present

## 2012-12-06 DIAGNOSIS — R262 Difficulty in walking, not elsewhere classified: Secondary | ICD-10-CM | POA: Diagnosis not present

## 2012-12-06 DIAGNOSIS — I69998 Other sequelae following unspecified cerebrovascular disease: Secondary | ICD-10-CM | POA: Diagnosis not present

## 2012-12-06 DIAGNOSIS — M6281 Muscle weakness (generalized): Secondary | ICD-10-CM | POA: Diagnosis not present

## 2012-12-06 DIAGNOSIS — R5381 Other malaise: Secondary | ICD-10-CM | POA: Diagnosis not present

## 2012-12-09 ENCOUNTER — Ambulatory Visit: Payer: Medicare Other | Admitting: Physical Therapy

## 2012-12-09 DIAGNOSIS — I69998 Other sequelae following unspecified cerebrovascular disease: Secondary | ICD-10-CM | POA: Diagnosis not present

## 2012-12-09 DIAGNOSIS — R5381 Other malaise: Secondary | ICD-10-CM | POA: Diagnosis not present

## 2012-12-09 DIAGNOSIS — R262 Difficulty in walking, not elsewhere classified: Secondary | ICD-10-CM | POA: Diagnosis not present

## 2012-12-09 DIAGNOSIS — IMO0001 Reserved for inherently not codable concepts without codable children: Secondary | ICD-10-CM | POA: Diagnosis not present

## 2012-12-09 DIAGNOSIS — M6281 Muscle weakness (generalized): Secondary | ICD-10-CM | POA: Diagnosis not present

## 2012-12-12 ENCOUNTER — Ambulatory Visit: Payer: Medicare Other | Admitting: Physical Therapy

## 2012-12-12 DIAGNOSIS — I69998 Other sequelae following unspecified cerebrovascular disease: Secondary | ICD-10-CM | POA: Diagnosis not present

## 2012-12-12 DIAGNOSIS — M6281 Muscle weakness (generalized): Secondary | ICD-10-CM | POA: Diagnosis not present

## 2012-12-12 DIAGNOSIS — IMO0001 Reserved for inherently not codable concepts without codable children: Secondary | ICD-10-CM | POA: Diagnosis not present

## 2012-12-12 DIAGNOSIS — R262 Difficulty in walking, not elsewhere classified: Secondary | ICD-10-CM | POA: Diagnosis not present

## 2012-12-12 DIAGNOSIS — R5381 Other malaise: Secondary | ICD-10-CM | POA: Diagnosis not present

## 2012-12-18 ENCOUNTER — Ambulatory Visit: Payer: Medicare Other | Admitting: Physical Therapy

## 2012-12-18 DIAGNOSIS — R5381 Other malaise: Secondary | ICD-10-CM | POA: Diagnosis not present

## 2012-12-18 DIAGNOSIS — IMO0001 Reserved for inherently not codable concepts without codable children: Secondary | ICD-10-CM | POA: Diagnosis not present

## 2012-12-18 DIAGNOSIS — M6281 Muscle weakness (generalized): Secondary | ICD-10-CM | POA: Diagnosis not present

## 2012-12-18 DIAGNOSIS — I69998 Other sequelae following unspecified cerebrovascular disease: Secondary | ICD-10-CM | POA: Diagnosis not present

## 2012-12-18 DIAGNOSIS — R262 Difficulty in walking, not elsewhere classified: Secondary | ICD-10-CM | POA: Diagnosis not present

## 2012-12-20 ENCOUNTER — Ambulatory Visit: Payer: Medicare Other | Admitting: Physical Therapy

## 2012-12-20 DIAGNOSIS — R262 Difficulty in walking, not elsewhere classified: Secondary | ICD-10-CM | POA: Diagnosis not present

## 2012-12-20 DIAGNOSIS — IMO0001 Reserved for inherently not codable concepts without codable children: Secondary | ICD-10-CM | POA: Diagnosis not present

## 2012-12-20 DIAGNOSIS — M6281 Muscle weakness (generalized): Secondary | ICD-10-CM | POA: Diagnosis not present

## 2012-12-20 DIAGNOSIS — I69998 Other sequelae following unspecified cerebrovascular disease: Secondary | ICD-10-CM | POA: Diagnosis not present

## 2012-12-20 DIAGNOSIS — R5381 Other malaise: Secondary | ICD-10-CM | POA: Diagnosis not present

## 2012-12-24 ENCOUNTER — Ambulatory Visit: Payer: Medicare Other | Admitting: Physical Therapy

## 2012-12-24 DIAGNOSIS — IMO0001 Reserved for inherently not codable concepts without codable children: Secondary | ICD-10-CM | POA: Diagnosis not present

## 2012-12-24 DIAGNOSIS — M6281 Muscle weakness (generalized): Secondary | ICD-10-CM | POA: Diagnosis not present

## 2012-12-24 DIAGNOSIS — R262 Difficulty in walking, not elsewhere classified: Secondary | ICD-10-CM | POA: Diagnosis not present

## 2012-12-24 DIAGNOSIS — I69998 Other sequelae following unspecified cerebrovascular disease: Secondary | ICD-10-CM | POA: Diagnosis not present

## 2012-12-24 DIAGNOSIS — R5381 Other malaise: Secondary | ICD-10-CM | POA: Diagnosis not present

## 2012-12-27 ENCOUNTER — Ambulatory Visit: Payer: Medicare Other | Attending: Family Medicine | Admitting: Physical Therapy

## 2012-12-27 DIAGNOSIS — R262 Difficulty in walking, not elsewhere classified: Secondary | ICD-10-CM | POA: Diagnosis not present

## 2012-12-27 DIAGNOSIS — IMO0001 Reserved for inherently not codable concepts without codable children: Secondary | ICD-10-CM | POA: Insufficient documentation

## 2012-12-27 DIAGNOSIS — M6281 Muscle weakness (generalized): Secondary | ICD-10-CM | POA: Insufficient documentation

## 2012-12-27 DIAGNOSIS — I69998 Other sequelae following unspecified cerebrovascular disease: Secondary | ICD-10-CM | POA: Diagnosis not present

## 2012-12-27 DIAGNOSIS — R5381 Other malaise: Secondary | ICD-10-CM | POA: Insufficient documentation

## 2012-12-31 ENCOUNTER — Ambulatory Visit: Payer: Medicare Other | Admitting: Physical Therapy

## 2013-01-03 ENCOUNTER — Ambulatory Visit: Payer: Medicare Other | Admitting: Physical Therapy

## 2013-01-07 ENCOUNTER — Ambulatory Visit: Payer: Medicare Other | Admitting: Physical Therapy

## 2013-01-10 ENCOUNTER — Ambulatory Visit: Payer: Medicare Other | Admitting: Physical Therapy

## 2013-01-14 ENCOUNTER — Ambulatory Visit: Payer: Medicare Other | Admitting: Physical Therapy

## 2013-01-17 ENCOUNTER — Ambulatory Visit: Payer: Medicare Other | Admitting: Physical Therapy

## 2013-01-21 ENCOUNTER — Ambulatory Visit: Payer: Medicare Other | Admitting: Physical Therapy

## 2013-01-24 ENCOUNTER — Ambulatory Visit: Payer: Medicare Other | Admitting: Physical Therapy

## 2013-01-27 ENCOUNTER — Telehealth: Payer: Self-pay | Admitting: *Deleted

## 2013-01-27 NOTE — Telephone Encounter (Signed)
OK to authorize as requested.

## 2013-01-27 NOTE — Telephone Encounter (Signed)
Left message authorizing OT as requested.Wyatt Haste, RN-BSN

## 2013-01-27 NOTE — Telephone Encounter (Signed)
Bretta Bang, OT calling to request order for private pay occupational therapy for pt as recommended by physical therapist while pt was in rehab. Would like to request 6 weeks - once a week for 60 minutes - please advise. Thanks! Wyatt Haste, RN-BSN

## 2013-01-28 ENCOUNTER — Ambulatory Visit: Payer: Medicare Other | Attending: Family Medicine | Admitting: Physical Therapy

## 2013-01-28 DIAGNOSIS — R5381 Other malaise: Secondary | ICD-10-CM | POA: Diagnosis not present

## 2013-01-28 DIAGNOSIS — M6281 Muscle weakness (generalized): Secondary | ICD-10-CM | POA: Insufficient documentation

## 2013-01-28 DIAGNOSIS — R262 Difficulty in walking, not elsewhere classified: Secondary | ICD-10-CM | POA: Diagnosis not present

## 2013-01-28 DIAGNOSIS — I69998 Other sequelae following unspecified cerebrovascular disease: Secondary | ICD-10-CM | POA: Insufficient documentation

## 2013-01-28 DIAGNOSIS — IMO0001 Reserved for inherently not codable concepts without codable children: Secondary | ICD-10-CM | POA: Insufficient documentation

## 2013-01-31 ENCOUNTER — Ambulatory Visit: Payer: Medicare Other | Admitting: Physical Therapy

## 2013-02-04 ENCOUNTER — Ambulatory Visit: Payer: Medicare Other | Admitting: Physical Therapy

## 2013-02-07 ENCOUNTER — Ambulatory Visit: Payer: Medicare Other | Admitting: Physical Therapy

## 2013-02-11 ENCOUNTER — Ambulatory Visit: Payer: Medicare Other | Admitting: Physical Therapy

## 2013-02-13 ENCOUNTER — Encounter: Payer: Medicare Other | Admitting: Physical Therapy

## 2013-02-13 ENCOUNTER — Ambulatory Visit: Payer: Medicare Other | Admitting: Physical Therapy

## 2013-02-14 ENCOUNTER — Ambulatory Visit: Payer: Medicare Other | Admitting: Physical Therapy

## 2013-02-18 ENCOUNTER — Ambulatory Visit: Payer: Medicare Other | Admitting: Physical Therapy

## 2013-02-20 ENCOUNTER — Ambulatory Visit: Payer: Medicare Other | Admitting: Physical Therapy

## 2013-02-25 ENCOUNTER — Ambulatory Visit: Payer: Medicare Other | Attending: Family Medicine | Admitting: Physical Therapy

## 2013-02-25 DIAGNOSIS — R262 Difficulty in walking, not elsewhere classified: Secondary | ICD-10-CM | POA: Diagnosis not present

## 2013-02-25 DIAGNOSIS — I69998 Other sequelae following unspecified cerebrovascular disease: Secondary | ICD-10-CM | POA: Diagnosis not present

## 2013-02-25 DIAGNOSIS — IMO0001 Reserved for inherently not codable concepts without codable children: Secondary | ICD-10-CM | POA: Diagnosis not present

## 2013-02-25 DIAGNOSIS — M6281 Muscle weakness (generalized): Secondary | ICD-10-CM | POA: Insufficient documentation

## 2013-02-25 DIAGNOSIS — R5381 Other malaise: Secondary | ICD-10-CM | POA: Insufficient documentation

## 2013-02-27 ENCOUNTER — Ambulatory Visit: Payer: Medicare Other | Admitting: Physical Therapy

## 2013-03-04 ENCOUNTER — Ambulatory Visit: Payer: Medicare Other | Admitting: Physical Therapy

## 2013-03-06 ENCOUNTER — Ambulatory Visit: Payer: Medicare Other | Admitting: Physical Therapy

## 2013-03-10 DIAGNOSIS — D239 Other benign neoplasm of skin, unspecified: Secondary | ICD-10-CM | POA: Diagnosis not present

## 2013-03-10 DIAGNOSIS — D1801 Hemangioma of skin and subcutaneous tissue: Secondary | ICD-10-CM | POA: Diagnosis not present

## 2013-03-10 DIAGNOSIS — L819 Disorder of pigmentation, unspecified: Secondary | ICD-10-CM | POA: Diagnosis not present

## 2013-03-10 DIAGNOSIS — L821 Other seborrheic keratosis: Secondary | ICD-10-CM | POA: Diagnosis not present

## 2013-03-12 ENCOUNTER — Ambulatory Visit: Payer: Medicare Other | Admitting: Physical Therapy

## 2013-03-14 ENCOUNTER — Ambulatory Visit: Payer: Medicare Other | Admitting: Physical Therapy

## 2013-03-19 ENCOUNTER — Ambulatory Visit: Payer: Medicare Other | Admitting: Physical Therapy

## 2013-03-21 ENCOUNTER — Ambulatory Visit: Payer: Medicare Other | Admitting: Physical Therapy

## 2013-03-26 ENCOUNTER — Ambulatory Visit: Payer: Medicare Other | Admitting: Physical Therapy

## 2013-03-28 ENCOUNTER — Ambulatory Visit: Payer: Medicare Other | Attending: Family Medicine | Admitting: Physical Therapy

## 2013-03-28 DIAGNOSIS — M6281 Muscle weakness (generalized): Secondary | ICD-10-CM | POA: Insufficient documentation

## 2013-03-28 DIAGNOSIS — I69998 Other sequelae following unspecified cerebrovascular disease: Secondary | ICD-10-CM | POA: Insufficient documentation

## 2013-03-28 DIAGNOSIS — IMO0001 Reserved for inherently not codable concepts without codable children: Secondary | ICD-10-CM | POA: Insufficient documentation

## 2013-03-28 DIAGNOSIS — R262 Difficulty in walking, not elsewhere classified: Secondary | ICD-10-CM | POA: Diagnosis not present

## 2013-03-28 DIAGNOSIS — R5381 Other malaise: Secondary | ICD-10-CM | POA: Diagnosis not present

## 2013-04-02 ENCOUNTER — Ambulatory Visit: Payer: Medicare Other | Admitting: Physical Therapy

## 2013-04-04 ENCOUNTER — Ambulatory Visit: Payer: Medicare Other | Admitting: Physical Therapy

## 2013-04-09 ENCOUNTER — Ambulatory Visit: Payer: Medicare Other | Admitting: Physical Therapy

## 2013-04-11 ENCOUNTER — Ambulatory Visit: Payer: Medicare Other | Admitting: Physical Therapy

## 2013-04-14 ENCOUNTER — Ambulatory Visit: Payer: Medicare Other | Admitting: Physical Therapy

## 2013-04-15 DIAGNOSIS — H5210 Myopia, unspecified eye: Secondary | ICD-10-CM | POA: Diagnosis not present

## 2013-04-15 DIAGNOSIS — Z961 Presence of intraocular lens: Secondary | ICD-10-CM | POA: Diagnosis not present

## 2013-04-16 ENCOUNTER — Ambulatory Visit: Payer: Medicare Other | Admitting: Physical Therapy

## 2013-04-23 ENCOUNTER — Ambulatory Visit: Payer: Medicare Other | Admitting: Physical Therapy

## 2013-04-25 ENCOUNTER — Ambulatory Visit: Payer: Medicare Other | Admitting: Physical Therapy

## 2013-04-29 ENCOUNTER — Ambulatory Visit: Payer: Medicare Other | Attending: Family Medicine | Admitting: Physical Therapy

## 2013-04-29 DIAGNOSIS — M6281 Muscle weakness (generalized): Secondary | ICD-10-CM | POA: Diagnosis not present

## 2013-04-29 DIAGNOSIS — R262 Difficulty in walking, not elsewhere classified: Secondary | ICD-10-CM | POA: Insufficient documentation

## 2013-04-29 DIAGNOSIS — R5381 Other malaise: Secondary | ICD-10-CM | POA: Diagnosis not present

## 2013-04-29 DIAGNOSIS — I69998 Other sequelae following unspecified cerebrovascular disease: Secondary | ICD-10-CM | POA: Insufficient documentation

## 2013-04-29 DIAGNOSIS — IMO0001 Reserved for inherently not codable concepts without codable children: Secondary | ICD-10-CM | POA: Diagnosis not present

## 2013-05-01 ENCOUNTER — Ambulatory Visit: Payer: Medicare Other | Admitting: Physical Therapy

## 2013-05-05 ENCOUNTER — Ambulatory Visit: Payer: Medicare Other | Admitting: Physical Therapy

## 2013-05-09 ENCOUNTER — Ambulatory Visit: Payer: Medicare Other | Admitting: Physical Therapy

## 2013-05-16 ENCOUNTER — Ambulatory Visit (INDEPENDENT_AMBULATORY_CARE_PROVIDER_SITE_OTHER): Payer: Medicare Other | Admitting: Family Medicine

## 2013-05-16 ENCOUNTER — Encounter: Payer: Self-pay | Admitting: Family Medicine

## 2013-05-16 VITALS — BP 124/62 | HR 72 | Temp 98.3°F | Ht <= 58 in

## 2013-05-16 DIAGNOSIS — E7800 Pure hypercholesterolemia, unspecified: Secondary | ICD-10-CM

## 2013-05-16 DIAGNOSIS — E559 Vitamin D deficiency, unspecified: Secondary | ICD-10-CM | POA: Diagnosis not present

## 2013-05-16 DIAGNOSIS — Z0000 Encounter for general adult medical examination without abnormal findings: Secondary | ICD-10-CM | POA: Diagnosis not present

## 2013-05-16 DIAGNOSIS — Z23 Encounter for immunization: Secondary | ICD-10-CM | POA: Diagnosis not present

## 2013-05-16 DIAGNOSIS — I10 Essential (primary) hypertension: Secondary | ICD-10-CM

## 2013-05-16 LAB — CBC
HCT: 46 % (ref 36.0–46.0)
Hemoglobin: 16.4 g/dL — ABNORMAL HIGH (ref 12.0–15.0)
MCHC: 35.7 g/dL (ref 30.0–36.0)
MCV: 89.7 fL (ref 78.0–100.0)
RDW: 12.7 % (ref 11.5–15.5)
WBC: 6 10*3/uL (ref 4.0–10.5)

## 2013-05-16 LAB — LIPID PANEL
LDL Cholesterol: 73 mg/dL (ref 0–99)
Triglycerides: 138 mg/dL (ref <150)
VLDL: 28 mg/dL (ref 0–40)

## 2013-05-16 LAB — COMPLETE METABOLIC PANEL WITH GFR
ALT: 24 U/L (ref 0–35)
AST: 23 U/L (ref 0–37)
Albumin: 4.3 g/dL (ref 3.5–5.2)
CO2: 30 mEq/L (ref 19–32)
Calcium: 9.8 mg/dL (ref 8.4–10.5)
Chloride: 98 mEq/L (ref 96–112)
GFR, Est African American: >89 mL/min
Potassium: 3.8 mEq/L (ref 3.5–5.3)
Sodium: 135 mEq/L (ref 135–145)
Total Protein: 7.3 g/dL (ref 6.0–8.3)

## 2013-05-16 NOTE — Addendum Note (Signed)
Addended by: Tanna Savoy on: 05/16/2013 04:04 PM   Modules accepted: Orders

## 2013-05-16 NOTE — Assessment & Plan Note (Signed)
Well controled, check labs

## 2013-05-16 NOTE — Progress Notes (Signed)
  Subjective:    Patient ID: Karen Jackson, female    DOB: 21-Apr-1941, 72 y.o.   MRN: [id number]  HPI Annual physical.  Feels well.  Stable from old CVA.  No new complaints.   HPDP up to date except will get flu shot today and new recommendation for conjugated pneumococcal vaccine.   CVA.  Not on ASA or plavix. .  Reviewing, she had hemorrhagic stroke, so no antiplatelet. High choleserol, needs check HBP blood pressure per home readings great     Review of Systems No Chest pain, SOB, bowel, bladder or weight changes.  No bleeding.     Objective:   Physical ExamHEENT normal Neck supple  Lungs clear Cardiac RRR with 1/6 SEM Abd benign Ext no edema Neuro Left hemiparesis.        Assessment & Plan:

## 2013-05-16 NOTE — Patient Instructions (Addendum)
Make sure you get a mammogram any time.  For sure by May 2015 We will stop the fosamax June 2015. Of course, weight loss would be good.   You received a flu shot today and the new recommended conjugated pneumonia vaccine.  You are done with pneumonia vaccines. I will call with blood work results. Make sure you check the medicine list.  Tell me about any discrepancies when I call with lab results.

## 2013-05-16 NOTE — Assessment & Plan Note (Signed)
Check labs 

## 2013-06-20 ENCOUNTER — Other Ambulatory Visit: Payer: Self-pay | Admitting: Family Medicine

## 2013-06-21 ENCOUNTER — Other Ambulatory Visit: Payer: Self-pay | Admitting: Family Medicine

## 2013-06-27 ENCOUNTER — Other Ambulatory Visit: Payer: Self-pay | Admitting: Family Medicine

## 2013-07-18 ENCOUNTER — Other Ambulatory Visit: Payer: Self-pay | Admitting: Family Medicine

## 2013-07-18 DIAGNOSIS — I10 Essential (primary) hypertension: Secondary | ICD-10-CM

## 2013-07-20 ENCOUNTER — Other Ambulatory Visit: Payer: Self-pay | Admitting: Family Medicine

## 2013-07-20 DIAGNOSIS — K219 Gastro-esophageal reflux disease without esophagitis: Secondary | ICD-10-CM

## 2013-07-21 NOTE — Assessment & Plan Note (Signed)
E request refill

## 2013-07-21 NOTE — Assessment & Plan Note (Signed)
E refill requet

## 2013-07-27 ENCOUNTER — Other Ambulatory Visit: Payer: Self-pay | Admitting: Family Medicine

## 2013-08-08 ENCOUNTER — Other Ambulatory Visit: Payer: Self-pay | Admitting: Family Medicine

## 2013-09-01 ENCOUNTER — Telehealth: Payer: Self-pay | Admitting: Family Medicine

## 2013-09-01 DIAGNOSIS — I619 Nontraumatic intracerebral hemorrhage, unspecified: Secondary | ICD-10-CM

## 2013-09-01 NOTE — Telephone Encounter (Signed)
Related message,patient voiced understanding. , Karen Jackson

## 2013-09-01 NOTE — Telephone Encounter (Signed)
Pt called because she was told that after the new year she could go back to PT at [facility]. She would like [place] to put the referral in. jw

## 2013-09-01 NOTE — Assessment & Plan Note (Signed)
Reorder PT eval per patient request.

## 2013-09-09 ENCOUNTER — Ambulatory Visit: Payer: Medicare Other | Attending: Family Medicine | Admitting: Physical Therapy

## 2013-09-09 DIAGNOSIS — R262 Difficulty in walking, not elsewhere classified: Secondary | ICD-10-CM | POA: Insufficient documentation

## 2013-09-09 DIAGNOSIS — M6281 Muscle weakness (generalized): Secondary | ICD-10-CM | POA: Diagnosis not present

## 2013-09-09 DIAGNOSIS — IMO0001 Reserved for inherently not codable concepts without codable children: Secondary | ICD-10-CM | POA: Insufficient documentation

## 2013-09-09 DIAGNOSIS — R5381 Other malaise: Secondary | ICD-10-CM | POA: Diagnosis not present

## 2013-09-11 ENCOUNTER — Ambulatory Visit: Payer: Medicare Other | Admitting: Physical Therapy

## 2013-09-15 ENCOUNTER — Ambulatory Visit: Payer: Medicare Other | Admitting: Physical Therapy

## 2013-09-17 ENCOUNTER — Ambulatory Visit: Payer: Medicare Other | Admitting: Physical Therapy

## 2013-09-18 ENCOUNTER — Other Ambulatory Visit: Payer: Self-pay | Admitting: Family Medicine

## 2013-09-19 ENCOUNTER — Ambulatory Visit: Payer: Medicare Other | Admitting: Physical Therapy

## 2013-09-22 ENCOUNTER — Ambulatory Visit: Payer: Medicare Other | Admitting: Physical Therapy

## 2013-09-24 ENCOUNTER — Ambulatory Visit: Payer: Medicare Other | Admitting: Physical Therapy

## 2013-09-26 ENCOUNTER — Ambulatory Visit: Payer: Medicare Other | Admitting: Physical Therapy

## 2013-09-29 ENCOUNTER — Ambulatory Visit: Payer: Medicare Other | Attending: Family Medicine | Admitting: Physical Therapy

## 2013-09-29 DIAGNOSIS — M6281 Muscle weakness (generalized): Secondary | ICD-10-CM | POA: Insufficient documentation

## 2013-09-29 DIAGNOSIS — IMO0001 Reserved for inherently not codable concepts without codable children: Secondary | ICD-10-CM | POA: Diagnosis not present

## 2013-09-29 DIAGNOSIS — R262 Difficulty in walking, not elsewhere classified: Secondary | ICD-10-CM | POA: Diagnosis not present

## 2013-09-29 DIAGNOSIS — R5381 Other malaise: Secondary | ICD-10-CM | POA: Insufficient documentation

## 2013-10-01 ENCOUNTER — Ambulatory Visit: Payer: Medicare Other | Admitting: Physical Therapy

## 2013-10-03 ENCOUNTER — Ambulatory Visit: Payer: Medicare Other | Admitting: Physical Therapy

## 2013-10-06 ENCOUNTER — Ambulatory Visit: Payer: Medicare Other | Admitting: Physical Therapy

## 2013-10-08 ENCOUNTER — Ambulatory Visit: Payer: Medicare Other | Admitting: Physical Therapy

## 2013-10-10 ENCOUNTER — Ambulatory Visit: Payer: Medicare Other | Admitting: Physical Therapy

## 2013-10-13 ENCOUNTER — Ambulatory Visit: Payer: Medicare Other | Admitting: Physical Therapy

## 2013-10-15 ENCOUNTER — Ambulatory Visit: Payer: Medicare Other | Admitting: Physical Therapy

## 2013-10-17 ENCOUNTER — Ambulatory Visit: Payer: Medicare Other | Admitting: Physical Therapy

## 2013-10-20 ENCOUNTER — Ambulatory Visit: Payer: Medicare Other | Admitting: Physical Therapy

## 2013-10-21 DIAGNOSIS — H6120 Impacted cerumen, unspecified ear: Secondary | ICD-10-CM | POA: Diagnosis not present

## 2013-10-22 ENCOUNTER — Ambulatory Visit: Payer: Medicare Other | Admitting: Physical Therapy

## 2013-10-27 ENCOUNTER — Ambulatory Visit: Payer: Medicare Other | Attending: Family Medicine | Admitting: Physical Therapy

## 2013-10-27 DIAGNOSIS — IMO0001 Reserved for inherently not codable concepts without codable children: Secondary | ICD-10-CM | POA: Insufficient documentation

## 2013-10-27 DIAGNOSIS — M6281 Muscle weakness (generalized): Secondary | ICD-10-CM | POA: Insufficient documentation

## 2013-10-27 DIAGNOSIS — R262 Difficulty in walking, not elsewhere classified: Secondary | ICD-10-CM | POA: Insufficient documentation

## 2013-10-27 DIAGNOSIS — R5381 Other malaise: Secondary | ICD-10-CM | POA: Diagnosis not present

## 2013-10-29 ENCOUNTER — Ambulatory Visit: Payer: Medicare Other | Admitting: Physical Therapy

## 2013-10-31 ENCOUNTER — Ambulatory Visit: Payer: Medicare Other | Admitting: Physical Therapy

## 2013-11-03 ENCOUNTER — Ambulatory Visit: Payer: Medicare Other | Admitting: Physical Therapy

## 2013-11-04 ENCOUNTER — Other Ambulatory Visit: Payer: Self-pay | Admitting: Family Medicine

## 2013-11-05 ENCOUNTER — Ambulatory Visit: Payer: Medicare Other | Admitting: Physical Therapy

## 2013-11-07 ENCOUNTER — Ambulatory Visit: Payer: Medicare Other | Admitting: Physical Therapy

## 2013-11-10 ENCOUNTER — Ambulatory Visit: Payer: Medicare Other | Admitting: Physical Therapy

## 2013-11-12 ENCOUNTER — Ambulatory Visit: Payer: Medicare Other | Admitting: Physical Therapy

## 2013-11-14 ENCOUNTER — Ambulatory Visit: Payer: Medicare Other | Admitting: Physical Therapy

## 2013-11-17 ENCOUNTER — Ambulatory Visit: Payer: Medicare Other | Admitting: Physical Therapy

## 2013-11-19 ENCOUNTER — Ambulatory Visit: Payer: Medicare Other | Admitting: Physical Therapy

## 2013-11-21 ENCOUNTER — Ambulatory Visit: Payer: Medicare Other | Admitting: Physical Therapy

## 2013-11-24 ENCOUNTER — Ambulatory Visit: Payer: Medicare Other | Admitting: Physical Therapy

## 2013-11-26 ENCOUNTER — Ambulatory Visit: Payer: Medicare Other | Attending: Family Medicine | Admitting: Physical Therapy

## 2013-11-26 DIAGNOSIS — M6281 Muscle weakness (generalized): Secondary | ICD-10-CM | POA: Diagnosis not present

## 2013-11-26 DIAGNOSIS — R5381 Other malaise: Secondary | ICD-10-CM | POA: Diagnosis not present

## 2013-11-26 DIAGNOSIS — R262 Difficulty in walking, not elsewhere classified: Secondary | ICD-10-CM | POA: Insufficient documentation

## 2013-11-26 DIAGNOSIS — IMO0001 Reserved for inherently not codable concepts without codable children: Secondary | ICD-10-CM | POA: Insufficient documentation

## 2013-12-01 ENCOUNTER — Ambulatory Visit: Payer: Medicare Other | Admitting: Physical Therapy

## 2013-12-05 ENCOUNTER — Ambulatory Visit: Payer: Medicare Other | Admitting: Physical Therapy

## 2013-12-08 ENCOUNTER — Ambulatory Visit: Payer: Medicare Other | Admitting: Physical Therapy

## 2013-12-10 DIAGNOSIS — Z1231 Encounter for screening mammogram for malignant neoplasm of breast: Secondary | ICD-10-CM | POA: Diagnosis not present

## 2013-12-11 ENCOUNTER — Encounter: Payer: Self-pay | Admitting: Family Medicine

## 2013-12-11 NOTE — Progress Notes (Signed)
Patient ID: Karen Jackson, female   DOB: [birth year], 73 y.o.   MRN: [id number] Received normal mammo report from [facility] for exam 12/10/13

## 2013-12-12 ENCOUNTER — Ambulatory Visit: Payer: Medicare Other | Admitting: Physical Therapy

## 2013-12-15 ENCOUNTER — Ambulatory Visit: Payer: Medicare Other | Admitting: Physical Therapy

## 2013-12-16 DIAGNOSIS — M25579 Pain in unspecified ankle and joints of unspecified foot: Secondary | ICD-10-CM | POA: Diagnosis not present

## 2013-12-16 DIAGNOSIS — Z96659 Presence of unspecified artificial knee joint: Secondary | ICD-10-CM | POA: Diagnosis not present

## 2013-12-16 DIAGNOSIS — M19079 Primary osteoarthritis, unspecified ankle and foot: Secondary | ICD-10-CM | POA: Diagnosis not present

## 2013-12-19 ENCOUNTER — Ambulatory Visit: Payer: Medicare Other | Admitting: Physical Therapy

## 2013-12-22 ENCOUNTER — Ambulatory Visit: Payer: Medicare Other | Admitting: Physical Therapy

## 2013-12-26 ENCOUNTER — Ambulatory Visit: Payer: Medicare Other | Admitting: Physical Therapy

## 2014-01-15 ENCOUNTER — Telehealth: Payer: Self-pay | Admitting: Family Medicine

## 2014-01-15 NOTE — Telephone Encounter (Signed)
Patient calls stating that there is a rash on lower right calf. No itching, burning or discomfort. Would like to know what to do or what to put on it. Please advise.

## 2014-01-15 NOTE — Telephone Encounter (Signed)
Reviewed and agree.

## 2014-01-15 NOTE — Telephone Encounter (Signed)
Returned call to pt regarding rash on ankle area.  Pt stated that the redness is more pink in color now and no other symptoms.  Pt told to try hyrdocortisone cream and if it does not go away then schedule an appt.  Pt does not think something bite her.  Pt stated understanding.  Derl Barrow, RN

## 2014-01-20 ENCOUNTER — Ambulatory Visit: Payer: Medicare Other | Attending: Family Medicine | Admitting: Physical Therapy

## 2014-01-20 DIAGNOSIS — M6281 Muscle weakness (generalized): Secondary | ICD-10-CM | POA: Diagnosis not present

## 2014-01-20 DIAGNOSIS — R5381 Other malaise: Secondary | ICD-10-CM | POA: Insufficient documentation

## 2014-01-20 DIAGNOSIS — IMO0001 Reserved for inherently not codable concepts without codable children: Secondary | ICD-10-CM | POA: Insufficient documentation

## 2014-01-20 DIAGNOSIS — R262 Difficulty in walking, not elsewhere classified: Secondary | ICD-10-CM | POA: Diagnosis not present

## 2014-01-23 ENCOUNTER — Ambulatory Visit: Payer: Medicare Other | Admitting: Physical Therapy

## 2014-01-26 ENCOUNTER — Ambulatory Visit: Payer: Medicare Other | Attending: Family Medicine | Admitting: Physical Therapy

## 2014-01-26 DIAGNOSIS — IMO0001 Reserved for inherently not codable concepts without codable children: Secondary | ICD-10-CM | POA: Insufficient documentation

## 2014-01-26 DIAGNOSIS — R262 Difficulty in walking, not elsewhere classified: Secondary | ICD-10-CM | POA: Insufficient documentation

## 2014-01-26 DIAGNOSIS — M6281 Muscle weakness (generalized): Secondary | ICD-10-CM | POA: Diagnosis not present

## 2014-01-26 DIAGNOSIS — R5381 Other malaise: Secondary | ICD-10-CM | POA: Insufficient documentation

## 2014-01-30 ENCOUNTER — Ambulatory Visit: Payer: Medicare Other | Admitting: Physical Therapy

## 2014-02-02 ENCOUNTER — Ambulatory Visit: Payer: Medicare Other | Admitting: Physical Therapy

## 2014-02-06 ENCOUNTER — Ambulatory Visit: Payer: Medicare Other | Admitting: Physical Therapy

## 2014-02-09 ENCOUNTER — Ambulatory Visit: Payer: Medicare Other | Admitting: Physical Therapy

## 2014-02-12 ENCOUNTER — Ambulatory Visit: Payer: Medicare Other | Admitting: Physical Therapy

## 2014-02-18 ENCOUNTER — Ambulatory Visit: Payer: Medicare Other | Admitting: Physical Therapy

## 2014-02-20 ENCOUNTER — Ambulatory Visit: Payer: Medicare Other | Admitting: Physical Therapy

## 2014-02-23 ENCOUNTER — Ambulatory Visit: Payer: Medicare Other | Admitting: Physical Therapy

## 2014-02-24 ENCOUNTER — Other Ambulatory Visit: Payer: Self-pay | Admitting: Family Medicine

## 2014-02-25 ENCOUNTER — Ambulatory Visit: Payer: Medicare Other | Attending: Family Medicine | Admitting: Physical Therapy

## 2014-02-25 DIAGNOSIS — R5381 Other malaise: Secondary | ICD-10-CM | POA: Diagnosis not present

## 2014-02-25 DIAGNOSIS — R262 Difficulty in walking, not elsewhere classified: Secondary | ICD-10-CM | POA: Diagnosis not present

## 2014-02-25 DIAGNOSIS — IMO0001 Reserved for inherently not codable concepts without codable children: Secondary | ICD-10-CM | POA: Insufficient documentation

## 2014-02-25 DIAGNOSIS — M6281 Muscle weakness (generalized): Secondary | ICD-10-CM | POA: Insufficient documentation

## 2014-02-26 ENCOUNTER — Other Ambulatory Visit: Payer: Self-pay | Admitting: *Deleted

## 2014-02-26 MED ORDER — MAGNESIUM OXIDE 400 (241.3 MG) MG PO TABS: ORAL_TABLET | ORAL | Status: DC

## 2014-03-03 ENCOUNTER — Ambulatory Visit: Payer: Medicare Other | Admitting: Physical Therapy

## 2014-03-03 DIAGNOSIS — IMO0001 Reserved for inherently not codable concepts without codable children: Secondary | ICD-10-CM | POA: Diagnosis not present

## 2014-03-06 ENCOUNTER — Ambulatory Visit: Payer: Medicare Other | Admitting: Physical Therapy

## 2014-03-06 DIAGNOSIS — IMO0001 Reserved for inherently not codable concepts without codable children: Secondary | ICD-10-CM | POA: Diagnosis not present

## 2014-03-09 ENCOUNTER — Ambulatory Visit: Payer: Medicare Other | Admitting: Physical Therapy

## 2014-03-11 ENCOUNTER — Ambulatory Visit: Payer: Medicare Other | Admitting: Physical Therapy

## 2014-03-13 ENCOUNTER — Ambulatory Visit: Payer: Medicare Other | Admitting: Physical Therapy

## 2014-03-13 DIAGNOSIS — IMO0001 Reserved for inherently not codable concepts without codable children: Secondary | ICD-10-CM | POA: Diagnosis not present

## 2014-03-17 ENCOUNTER — Ambulatory Visit: Payer: Medicare Other | Admitting: Physical Therapy

## 2014-03-17 DIAGNOSIS — IMO0001 Reserved for inherently not codable concepts without codable children: Secondary | ICD-10-CM | POA: Diagnosis not present

## 2014-03-20 ENCOUNTER — Ambulatory Visit: Payer: Medicare Other | Admitting: Physical Therapy

## 2014-03-20 DIAGNOSIS — IMO0001 Reserved for inherently not codable concepts without codable children: Secondary | ICD-10-CM | POA: Diagnosis not present

## 2014-03-24 ENCOUNTER — Ambulatory Visit: Payer: Medicare Other | Admitting: Physical Therapy

## 2014-03-24 DIAGNOSIS — IMO0001 Reserved for inherently not codable concepts without codable children: Secondary | ICD-10-CM | POA: Diagnosis not present

## 2014-03-26 ENCOUNTER — Ambulatory Visit: Payer: Medicare Other | Admitting: Physical Therapy

## 2014-03-26 DIAGNOSIS — IMO0001 Reserved for inherently not codable concepts without codable children: Secondary | ICD-10-CM | POA: Diagnosis not present

## 2014-03-31 ENCOUNTER — Ambulatory Visit: Payer: Medicare Other | Attending: Family Medicine | Admitting: Physical Therapy

## 2014-03-31 DIAGNOSIS — M6281 Muscle weakness (generalized): Secondary | ICD-10-CM | POA: Diagnosis not present

## 2014-03-31 DIAGNOSIS — R262 Difficulty in walking, not elsewhere classified: Secondary | ICD-10-CM | POA: Diagnosis not present

## 2014-03-31 DIAGNOSIS — R5381 Other malaise: Secondary | ICD-10-CM | POA: Insufficient documentation

## 2014-03-31 DIAGNOSIS — IMO0001 Reserved for inherently not codable concepts without codable children: Secondary | ICD-10-CM | POA: Insufficient documentation

## 2014-04-03 ENCOUNTER — Ambulatory Visit: Payer: Medicare Other | Admitting: Physical Therapy

## 2014-04-03 DIAGNOSIS — IMO0001 Reserved for inherently not codable concepts without codable children: Secondary | ICD-10-CM | POA: Diagnosis not present

## 2014-04-07 ENCOUNTER — Ambulatory Visit: Payer: Medicare Other | Admitting: Physical Therapy

## 2014-04-07 DIAGNOSIS — IMO0001 Reserved for inherently not codable concepts without codable children: Secondary | ICD-10-CM | POA: Diagnosis not present

## 2014-04-09 ENCOUNTER — Encounter: Payer: Medicare Other | Admitting: Physical Therapy

## 2014-04-10 ENCOUNTER — Ambulatory Visit: Payer: Medicare Other | Admitting: Physical Therapy

## 2014-04-10 DIAGNOSIS — IMO0001 Reserved for inherently not codable concepts without codable children: Secondary | ICD-10-CM | POA: Diagnosis not present

## 2014-04-14 ENCOUNTER — Ambulatory Visit: Payer: Medicare Other | Admitting: Physical Therapy

## 2014-04-14 DIAGNOSIS — IMO0001 Reserved for inherently not codable concepts without codable children: Secondary | ICD-10-CM | POA: Diagnosis not present

## 2014-04-16 ENCOUNTER — Ambulatory Visit: Payer: Medicare Other | Admitting: Physical Therapy

## 2014-04-16 DIAGNOSIS — IMO0001 Reserved for inherently not codable concepts without codable children: Secondary | ICD-10-CM | POA: Diagnosis not present

## 2014-04-20 ENCOUNTER — Ambulatory Visit: Payer: Medicare Other | Admitting: Physical Therapy

## 2014-04-20 DIAGNOSIS — IMO0001 Reserved for inherently not codable concepts without codable children: Secondary | ICD-10-CM | POA: Diagnosis not present

## 2014-04-23 ENCOUNTER — Ambulatory Visit: Payer: Medicare Other | Admitting: Physical Therapy

## 2014-04-23 DIAGNOSIS — IMO0001 Reserved for inherently not codable concepts without codable children: Secondary | ICD-10-CM | POA: Diagnosis not present

## 2014-04-24 ENCOUNTER — Encounter: Payer: Medicare Other | Admitting: Family Medicine

## 2014-04-24 ENCOUNTER — Encounter: Payer: Self-pay | Admitting: Family Medicine

## 2014-04-24 ENCOUNTER — Ambulatory Visit (INDEPENDENT_AMBULATORY_CARE_PROVIDER_SITE_OTHER): Payer: Medicare Other | Admitting: Family Medicine

## 2014-04-24 VITALS — BP 135/74 | HR 85 | Temp 99.2°F | Ht <= 58 in | Wt 174.0 lb

## 2014-04-24 DIAGNOSIS — I6990 Unspecified sequelae of unspecified cerebrovascular disease: Secondary | ICD-10-CM | POA: Diagnosis not present

## 2014-04-24 DIAGNOSIS — R7309 Other abnormal glucose: Secondary | ICD-10-CM | POA: Diagnosis not present

## 2014-04-24 DIAGNOSIS — E559 Vitamin D deficiency, unspecified: Secondary | ICD-10-CM

## 2014-04-24 DIAGNOSIS — I10 Essential (primary) hypertension: Secondary | ICD-10-CM

## 2014-04-24 DIAGNOSIS — E7800 Pure hypercholesterolemia, unspecified: Secondary | ICD-10-CM

## 2014-04-24 DIAGNOSIS — I69354 Hemiplegia and hemiparesis following cerebral infarction affecting left non-dominant side: Secondary | ICD-10-CM | POA: Insufficient documentation

## 2014-04-24 LAB — BASIC METABOLIC PANEL
BUN: 10 mg/dL (ref 6–23)
CHLORIDE: 96 meq/L (ref 96–112)
CO2: 29 meq/L (ref 19–32)
CREATININE: 0.53 mg/dL (ref 0.50–1.10)
Calcium: 10.4 mg/dL (ref 8.4–10.5)
Glucose, Bld: 114 mg/dL — ABNORMAL HIGH (ref 70–99)
Potassium: 4.1 mEq/L (ref 3.5–5.3)
SODIUM: 139 meq/L (ref 135–145)

## 2014-04-24 LAB — POCT GLYCOSYLATED HEMOGLOBIN (HGB A1C): HEMOGLOBIN A1C: 6.2

## 2014-04-24 LAB — MAGNESIUM: MAGNESIUM: 1.9 mg/dL (ref 1.5–2.5)

## 2014-04-24 LAB — LDL CHOLESTEROL, DIRECT: Direct LDL: 107 mg/dL — ABNORMAL HIGH

## 2014-04-24 NOTE — Assessment & Plan Note (Addendum)
Recheck labs.  Low again.  Start Vit D and calcium

## 2014-04-24 NOTE — Patient Instructions (Addendum)
Everything seems to be going fine. The biggest thing from my standpoint would slow healthy weight loss I will call Monday with blood work results. You should get a flu shot.  If you get elsewhere, let me know. We will likely do another bone density on you next year.

## 2014-04-24 NOTE — Assessment & Plan Note (Addendum)
Recheck Mag to see if still needed.  Remain on Mag since lab results at lower limit of normal on reg Mg supplement.

## 2014-04-24 NOTE — Assessment & Plan Note (Signed)
Recheck A1C 

## 2014-04-24 NOTE — Assessment & Plan Note (Signed)
Lt hemiparesis remains a challenge.

## 2014-04-24 NOTE — Progress Notes (Signed)
   Subjective:    Patient ID: Karen Jackson, female    DOB: [birth year], 73 y.o.   MRN: [id number]  HPI Doing very well.  Main issue is dealing with sequelli of CNS hemorragic CVA.  Wt holding its own.  Nurse check of home BPs are good.    Review of Systems     Objective:   Physical Exam Lungs clear Cardiac RRR without m or g Abd benign. No edema        Assessment & Plan:

## 2014-04-24 NOTE — Assessment & Plan Note (Signed)
Good control, no changes.

## 2014-04-25 LAB — VITAMIN D 25 HYDROXY (VIT D DEFICIENCY, FRACTURES): Vit D, 25-Hydroxy: 19 ng/mL — ABNORMAL LOW (ref 30–89)

## 2014-04-27 ENCOUNTER — Ambulatory Visit: Payer: Medicare Other | Admitting: Physical Therapy

## 2014-04-27 DIAGNOSIS — IMO0001 Reserved for inherently not codable concepts without codable children: Secondary | ICD-10-CM | POA: Diagnosis not present

## 2014-04-27 MED ORDER — CALCIUM CARBONATE-VITAMIN D3 600-400 MG-UNIT PO TABS: 1.0000 | ORAL_TABLET | Freq: Every day | ORAL | Status: AC

## 2014-04-27 NOTE — Addendum Note (Signed)
Addended by: Zenia Resides on: 04/27/2014 03:46 PM   Modules accepted: Orders, Medications

## 2014-04-29 ENCOUNTER — Telehealth: Payer: Self-pay | Admitting: Family Medicine

## 2014-04-29 DIAGNOSIS — Z23 Encounter for immunization: Secondary | ICD-10-CM | POA: Diagnosis not present

## 2014-04-29 NOTE — Telephone Encounter (Signed)
Pt called and wanted Dr. Andria Frames to know that she received her Flu Shot today 9/2 at [facility]. jw

## 2014-04-29 NOTE — Telephone Encounter (Signed)
Updated in chart

## 2014-04-30 ENCOUNTER — Ambulatory Visit: Payer: Medicare Other | Attending: Family Medicine | Admitting: Physical Therapy

## 2014-04-30 DIAGNOSIS — R262 Difficulty in walking, not elsewhere classified: Secondary | ICD-10-CM | POA: Insufficient documentation

## 2014-04-30 DIAGNOSIS — R5381 Other malaise: Secondary | ICD-10-CM | POA: Insufficient documentation

## 2014-04-30 DIAGNOSIS — M6281 Muscle weakness (generalized): Secondary | ICD-10-CM | POA: Diagnosis not present

## 2014-04-30 DIAGNOSIS — IMO0001 Reserved for inherently not codable concepts without codable children: Secondary | ICD-10-CM | POA: Diagnosis not present

## 2014-05-05 ENCOUNTER — Ambulatory Visit: Payer: Medicare Other | Admitting: Physical Therapy

## 2014-05-05 DIAGNOSIS — IMO0001 Reserved for inherently not codable concepts without codable children: Secondary | ICD-10-CM | POA: Diagnosis not present

## 2014-05-07 ENCOUNTER — Ambulatory Visit: Payer: Medicare Other | Admitting: Physical Therapy

## 2014-05-07 DIAGNOSIS — IMO0001 Reserved for inherently not codable concepts without codable children: Secondary | ICD-10-CM | POA: Diagnosis not present

## 2014-05-11 ENCOUNTER — Ambulatory Visit: Payer: Medicare Other | Admitting: Physical Therapy

## 2014-05-11 DIAGNOSIS — IMO0001 Reserved for inherently not codable concepts without codable children: Secondary | ICD-10-CM | POA: Diagnosis not present

## 2014-05-15 ENCOUNTER — Ambulatory Visit: Payer: Medicare Other | Admitting: Physical Therapy

## 2014-05-15 DIAGNOSIS — IMO0001 Reserved for inherently not codable concepts without codable children: Secondary | ICD-10-CM | POA: Diagnosis not present

## 2014-05-18 ENCOUNTER — Ambulatory Visit: Payer: Medicare Other | Admitting: Physical Therapy

## 2014-05-18 DIAGNOSIS — IMO0001 Reserved for inherently not codable concepts without codable children: Secondary | ICD-10-CM | POA: Diagnosis not present

## 2014-05-21 ENCOUNTER — Ambulatory Visit: Payer: Medicare Other | Admitting: Physical Therapy

## 2014-05-25 ENCOUNTER — Ambulatory Visit: Payer: Medicare Other | Admitting: Physical Therapy

## 2014-05-25 DIAGNOSIS — IMO0001 Reserved for inherently not codable concepts without codable children: Secondary | ICD-10-CM | POA: Diagnosis not present

## 2014-05-29 ENCOUNTER — Ambulatory Visit: Payer: Medicare Other | Admitting: Physical Therapy

## 2014-06-02 ENCOUNTER — Telehealth: Payer: Self-pay | Admitting: Family Medicine

## 2014-06-02 MED ORDER — MELOXICAM 7.5 MG PO TABS: 7.5000 mg | ORAL_TABLET | Freq: Every day | ORAL | Status: DC

## 2014-06-02 NOTE — Telephone Encounter (Signed)
Discussed and will try meloxicam - lower dose due to age.

## 2014-06-02 NOTE — Telephone Encounter (Signed)
Generic Celebrex is not working.  Sister's dr recommend her take Meloxicam Wants to know what Dr Andria Frames thinks about this

## 2014-06-03 ENCOUNTER — Ambulatory Visit: Payer: Medicare Other | Attending: Family Medicine | Admitting: Physical Therapy

## 2014-06-03 DIAGNOSIS — R5381 Other malaise: Secondary | ICD-10-CM | POA: Diagnosis not present

## 2014-06-03 DIAGNOSIS — R262 Difficulty in walking, not elsewhere classified: Secondary | ICD-10-CM | POA: Insufficient documentation

## 2014-06-03 DIAGNOSIS — I69398 Other sequelae of cerebral infarction: Secondary | ICD-10-CM | POA: Diagnosis not present

## 2014-06-10 ENCOUNTER — Telehealth: Payer: Self-pay | Admitting: Family Medicine

## 2014-06-10 NOTE — Telephone Encounter (Signed)
Pt called because she would like to go back on her Celebrex since the new medication is not working. jw

## 2014-06-11 MED ORDER — MELOXICAM 7.5 MG PO TABS: 15.0000 mg | ORAL_TABLET | Freq: Every day | ORAL | Status: DC

## 2014-06-11 NOTE — Telephone Encounter (Signed)
She will try meloxicam 15 for a while since the 7.5 does not seem to help.  She may go back to the celebrex.  I knows to be cognizant of dyspepsia symptoms.

## 2014-06-16 ENCOUNTER — Other Ambulatory Visit: Payer: Self-pay | Admitting: Family Medicine

## 2014-06-23 ENCOUNTER — Other Ambulatory Visit: Payer: Self-pay | Admitting: Family Medicine

## 2014-07-10 ENCOUNTER — Telehealth: Payer: Self-pay | Admitting: Family Medicine

## 2014-07-10 NOTE — Telephone Encounter (Signed)
Pt called because she is going to stay on Celebrex and also the gabapentin. She just wanted Dr. Andria Frames to know. jw

## 2014-07-13 MED ORDER — CELECOXIB 200 MG PO CAPS: 200.0000 mg | ORAL_CAPSULE | Freq: Two times a day (BID) | ORAL | Status: DC

## 2014-07-13 NOTE — Addendum Note (Signed)
Addended by: Zenia Resides on: 07/13/2014 02:50 PM   Modules accepted: Orders, Medications

## 2014-07-14 ENCOUNTER — Other Ambulatory Visit: Payer: Self-pay | Admitting: Family Medicine

## 2014-07-21 ENCOUNTER — Other Ambulatory Visit: Payer: Self-pay | Admitting: Family Medicine

## 2014-08-11 ENCOUNTER — Other Ambulatory Visit: Payer: Self-pay | Admitting: Family Medicine

## 2014-08-18 ENCOUNTER — Other Ambulatory Visit: Payer: Self-pay | Admitting: Family Medicine

## 2014-08-24 ENCOUNTER — Other Ambulatory Visit: Payer: Self-pay | Admitting: *Deleted

## 2014-08-25 ENCOUNTER — Other Ambulatory Visit: Payer: Self-pay | Admitting: *Deleted

## 2014-08-25 MED ORDER — TRAZODONE HCL 50 MG PO TABS: 25.0000 mg | ORAL_TABLET | Freq: Every evening | ORAL | Status: DC | PRN

## 2014-09-29 ENCOUNTER — Other Ambulatory Visit: Payer: Self-pay | Admitting: Family Medicine

## 2014-10-06 ENCOUNTER — Other Ambulatory Visit: Payer: Self-pay | Admitting: Family Medicine

## 2014-10-13 ENCOUNTER — Encounter: Payer: Self-pay | Admitting: *Deleted

## 2014-10-13 DIAGNOSIS — M158 Other polyosteoarthritis: Secondary | ICD-10-CM

## 2014-10-13 NOTE — Progress Notes (Signed)
Prior Authorization received from [employer] for Celebrex. PA form placed in provider box for completion. Derl Barrow, RN

## 2014-10-14 NOTE — Progress Notes (Addendum)
PA for Celebrex faxed to [facility] for review.  Derl Barrow, RN  Per [facility] medication is covered.  Pharmacy should call pharmacy help desk at [phone] if having problems processing the prescription.  Left voice message on [facility] pharmacy line, informing them of response from [facility].  Derl Barrow, RN

## 2014-10-14 NOTE — Assessment & Plan Note (Signed)
Form completed.

## 2014-10-27 ENCOUNTER — Telehealth: Payer: Self-pay | Admitting: Family Medicine

## 2014-10-27 NOTE — Telephone Encounter (Signed)
Pt called because her insurance will not cover her Celebrex any longer and she should try other medications. She has tried the other medications and they do not work. She said as long as the doctor faxes or calls them with the information that the only thing that works for her is Celebrex they will fill. jw Their phone number in [phone] or fax [phone]. Karen Jackson

## 2014-10-29 NOTE — Telephone Encounter (Signed)
Spoke with pt regarding prior authorization for Celebrex.  PA form was completed in February.  A response from [facility] was sent 10/14/2014 stating the medication is a covered drug and have the pharmacy to call help desk to help with processing the claim.  [facility] was called and given [facility] help desk number for processing. Derl Barrow, RN

## 2014-11-06 ENCOUNTER — Other Ambulatory Visit: Payer: Self-pay | Admitting: Dermatology

## 2014-11-06 DIAGNOSIS — C44319 Basal cell carcinoma of skin of other parts of face: Secondary | ICD-10-CM | POA: Diagnosis not present

## 2014-11-06 DIAGNOSIS — D485 Neoplasm of uncertain behavior of skin: Secondary | ICD-10-CM | POA: Diagnosis not present

## 2014-11-30 ENCOUNTER — Telehealth: Payer: Self-pay | Admitting: Family Medicine

## 2014-11-30 DIAGNOSIS — I69354 Hemiplegia and hemiparesis following cerebral infarction affecting left non-dominant side: Secondary | ICD-10-CM

## 2014-11-30 DIAGNOSIS — Y92009 Unspecified place in unspecified non-institutional (private) residence as the place of occurrence of the external cause: Secondary | ICD-10-CM

## 2014-11-30 DIAGNOSIS — W19XXXA Unspecified fall, initial encounter: Secondary | ICD-10-CM

## 2014-11-30 NOTE — Telephone Encounter (Signed)
Pt is requesting an rx for PT, recently fell and was told this new fall would be sufficient for medicaid to pay, goes to [facility]

## 2014-12-01 DIAGNOSIS — C4401 Basal cell carcinoma of skin of lip: Secondary | ICD-10-CM | POA: Diagnosis not present

## 2014-12-01 DIAGNOSIS — Y92009 Unspecified place in unspecified non-institutional (private) residence as the place of occurrence of the external cause: Secondary | ICD-10-CM

## 2014-12-01 DIAGNOSIS — Z85828 Personal history of other malignant neoplasm of skin: Secondary | ICD-10-CM | POA: Diagnosis not present

## 2014-12-01 DIAGNOSIS — W19XXXA Unspecified fall, initial encounter: Secondary | ICD-10-CM | POA: Insufficient documentation

## 2014-12-01 NOTE — Assessment & Plan Note (Signed)
Recent fall.  Restart PT

## 2014-12-04 NOTE — Telephone Encounter (Signed)
placed in work queue [facility]

## 2014-12-08 DIAGNOSIS — L570 Actinic keratosis: Secondary | ICD-10-CM | POA: Diagnosis not present

## 2014-12-11 ENCOUNTER — Ambulatory Visit: Payer: Medicare Other | Attending: Family Medicine | Admitting: Physical Therapy

## 2014-12-11 ENCOUNTER — Encounter: Payer: Self-pay | Admitting: Physical Therapy

## 2014-12-11 DIAGNOSIS — R5381 Other malaise: Secondary | ICD-10-CM | POA: Diagnosis not present

## 2014-12-11 DIAGNOSIS — R262 Difficulty in walking, not elsewhere classified: Secondary | ICD-10-CM | POA: Diagnosis not present

## 2014-12-11 DIAGNOSIS — M25572 Pain in left ankle and joints of left foot: Secondary | ICD-10-CM | POA: Diagnosis not present

## 2014-12-11 NOTE — Therapy (Signed)
[place], [place], [address] Phone: [phone]   Fax:  [phone]  Physical Therapy Evaluation  Patient Details  Name: Karen Jackson MRN: [id number] Date of Birth: 13-Dec-1940 Referring Provider:  Zenia Resides, MD  Encounter Date: 12/11/2014      PT End of Session - 12/11/14 1123    Visit Number 1   Date for PT Re-Evaluation 02/10/15   PT Start Time 1050   PT Stop Time 1150   PT Time Calculation (min) 60 min   Equipment Utilized During Treatment Other (comment)  has brace on the left ankle   Activity Tolerance Patient limited by fatigue      Past Medical History  Diagnosis Date  . Arthritis   . Hyperlipidemia   . Hypertension   . Osteopenia     Past Surgical History  Procedure Laterality Date  . Ankle surgery    . Joint replacement      total- right partial- left  . Appendectomy    . Cholecystectomy    . Hernia repair      Esophagus  . Mastectomy partial / lumpectomy  2012    left    There were no vitals filed for this visit.  Visit Diagnosis:  Difficulty walking - Plan: PT plan of care cert/re-cert  Debility - Plan: PT plan of care cert/re-cert  Left ankle pain - Plan: PT plan of care cert/re-cert      Subjective Assessment - 12/11/14 1103    Subjective Patient has had 2 recent falls, with some increased left ankle and bilateral knee pain.  Reports she is having difficulty walking recently   Pertinent History CVA 2013   Limitations Standing;Walking;House hold activities   How long can you sit comfortably? 2 hours   How long can you stand comfortably? 3 minutes   How long can you walk comfortably? 3 minutes   Patient Stated Goals walk without falls, have no ankle rolling   Currently in Pain? Yes   Pain Score 3    Pain Location Ankle   Pain Orientation Left   Pain Descriptors / Indicators Aching   Pain Type Chronic pain   Pain Onset 1 to 4 weeks ago   Pain  Frequency Intermittent   Aggravating Factors  standing and walking   Pain Relieving Factors rest and elevation   Effect of Pain on Daily Activities limited with all ADL's            [facility] PT Assessment - 12/11/14 0001    Assessment   Medical Diagnosis left ankle pain and diff walking   Onset Date 10/12/14   Prior Therapy last year   Precautions   Precaution Comments fall risk   Balance Screen   Has the patient fallen in the past 6 months Yes   How many times? 2   Has the patient had a decrease in activity level because of a fear of falling?  Yes   Is the patient reluctant to leave their home because of a fear of falling?  Yes   [place] Private residence   Additional Comments has an aide 12 hours a day   Prior Function   Level of Independence Needs assistance with homemaking;Requires assistive device for independence;Other (comment)  she was able to walk 600 feet last year with supervision   Leisure some exercise in gym   Posture/Postural Control   Posture Comments fwd head,  rounded shoulders, difficulty being up right with walking   AROM   Overall AROM Comments Has limited DF on the left, some ataxic motions of the left LE, significant limited AROM of the left arm with spasticity noted   Strength   Overall Strength Comments right LE 3+/5, left LE 3/5   she has some Eversion of the left ankle but no strength   Transfers   Transfers Stand Pivot Transfers   Stand Pivot Transfers 4: Min guard   Ambulation/Gait   Ambulation Surface Level   Gait Comments uses a FWW, very slow, step to, antalgic on the left, trunk very flexed, very deliberate, spasticity of the left LE with advancement   Standardized Balance Assessment   Standardized Balance Assessment Timed Up and Go Test   Timed Up and Go Test   Normal TUG (seconds) 50   High Level Balance   High Level Balance Comments she is unable to stand without use of hands and usually with a little assist,  she also cannot stand with out some support due to fear of falling, her recent falls were when she was standing and looked to the left, she reports that her left ankle rolled and she went down                   [facility] Adult PT Treatment/Exercise - 08-Jan-2015 0001    Lumbar Exercises: Aerobic   Stationary Bike 4 minutes some assist needed   Elliptical NuStep L 4 x 6 minutes   Tread Mill 1.0 MPH x 4 minutes with CGA and cues for step                  PT Short Term Goals - 01/08/2015 1137    PT SHORT TERM GOAL #1   Title independent with initial HEP   Time 2   Period Weeks   Status New           PT Long Term Goals - 01/08/2015 1138    PT LONG TERM GOAL #1   Title decrease timed up and go test to 25 seconds   Time 8   Period Weeks   Status New   PT LONG TERM GOAL #2   Title increased right LE strength to 4-/5   Time 8   Period Weeks   Status New   PT LONG TERM GOAL #3   Title increased left LE strength to 3+/5 for the hip and knee   Time 8   Period Weeks   Status New   PT LONG TERM GOAL #4   Title no falls   Time 8   Period Weeks   Status New   PT LONG TERM GOAL #5   Title walk 600 feet with supervision and FWW   Time 8   Period Weeks   Status New               Plan - 01-08-2015 1148    Clinical Impression Statement Significant decrease in mobility and strength as well as a loss of independence from last October.  Seems more timid and rigid with gait, afraid of falling   Consulted and Agree with Plan of Care Patient          G-Codes - 01/08/15 1150    Functional Assessment Tool Used FOTO   Functional Limitation Mobility: Walking and moving around   Mobility: Walking and Moving Around Current Status (H8527) At least 60 percent but less than 80 percent impaired, limited  or restricted   Mobility: Walking and Moving Around Goal Status [id number]) At least 40 percent but less than 60 percent impaired, limited or restricted       Problem  List Patient Active Problem List   Diagnosis Date Noted  . Fall at home 12/01/2014  . Hypomagnesemia 04/24/2014  . Hemiparesis affecting left side as late effect of cerebrovascular accident 04/24/2014  . Routine general medical examination at a health care facility 05/16/2013  . Nontraumatic cerebral hemorrhage 04/30/2012  . HYPERGLYCEMIA, FASTING 03/04/2010  . OSTEOPENIA 01/21/2009  . UNSPECIFIED VITAMIN D DEFICIENCY 11/19/2007  . ESSENTIAL HYPERTENSION, BENIGN 11/19/2007  . HYPERCHOLESTEROLEMIA 10/25/2006  . GASTROESOPHAGEAL REFLUX, NO ESOPHAGITIS 10/25/2006  . DIVERTICULOSIS OF COLON 10/25/2006  . Osteoarthritis 10/25/2006  . CERVICAL SPINE DISORDER, NOS 10/25/2006    Sumner Boast, PT 12/11/2014, 11:56 AM  [place] [address] [place], [place], [address] Phone: [phone]   Fax:  [phone]

## 2014-12-15 ENCOUNTER — Encounter: Payer: Self-pay | Admitting: Physical Therapy

## 2014-12-15 ENCOUNTER — Ambulatory Visit: Payer: Medicare Other | Admitting: Physical Therapy

## 2014-12-15 DIAGNOSIS — M25572 Pain in left ankle and joints of left foot: Secondary | ICD-10-CM

## 2014-12-15 DIAGNOSIS — R5381 Other malaise: Secondary | ICD-10-CM

## 2014-12-15 DIAGNOSIS — R262 Difficulty in walking, not elsewhere classified: Secondary | ICD-10-CM | POA: Diagnosis not present

## 2014-12-15 NOTE — Therapy (Signed)
[place], [place], [address] Phone: [phone]   Fax:  [phone]  Physical Therapy Treatment  Patient Details  Name: Karen Jackson MRN: [id number] Date of Birth: 07-Aug-1941 Referring Provider:  Zenia Resides, MD  Encounter Date: 12/15/2014      PT End of Session - 12/15/14 1057    Visit Number 2   Date for PT Re-Evaluation 02/10/15   PT Start Time 1005   PT Stop Time 1057   PT Time Calculation (min) 52 min      Past Medical History  Diagnosis Date  . Arthritis   . Hyperlipidemia   . Hypertension   . Osteopenia     Past Surgical History  Procedure Laterality Date  . Ankle surgery    . Joint replacement      total- right partial- left  . Appendectomy    . Cholecystectomy    . Hernia repair      Esophagus  . Mastectomy partial / lumpectomy  2012    left    There were no vitals filed for this visit.  Visit Diagnosis:  Difficulty walking  Debility  Left ankle pain      Subjective Assessment - 12/15/14 1020    Subjective Reports that she slid down to the floor, reports that he ankle rolled over and had to get help to get off of the floor   Currently in Pain? Yes   Pain Score 3    Pain Location Ankle   Pain Orientation Left   Pain Descriptors / Indicators Aching   Pain Type Chronic pain   Pain Onset 1 to 4 weeks ago   Pain Frequency Intermittent                         [facility] Adult PT Treatment/Exercise - 12/15/14 0001    High Level Balance   High Level Balance Activities Side stepping;Backward walking;Head turns   High Level Balance Comments ankle sit/fit exercises, 46" toe touches alternating, 4" step ups alternating, standing balance reaching, head turns   Lumbar Exercises: Aerobic   Stationary Bike 5 minutes   Tread Mill 1.0 MPH x 5 minutes                  PT Short Term Goals - 12/11/14 1137    PT SHORT TERM GOAL #1   Title  independent with initial HEP   Time 2   Period Weeks   Status New           PT Long Term Goals - 12/11/14 1138    PT LONG TERM GOAL #1   Title decrease timed up and go test to 25 seconds   Time 8   Period Weeks   Status New   PT LONG TERM GOAL #2   Title increased right LE strength to 4-/5   Time 8   Period Weeks   Status New   PT LONG TERM GOAL #3   Title increased left LE strength to 3+/5 for the hip and knee   Time 8   Period Weeks   Status New   PT LONG TERM GOAL #4   Title no falls   Time 8   Period Weeks   Status New   PT LONG TERM GOAL #5   Title walk 600 feet with supervision and FWW   Time 8   Period Weeks   Status  New               Plan - 12/15/14 1058    Clinical Impression Statement A real decrease in her abilities, does not trust herself, left ankle and knee give out fast with fatigue   PT Next Visit Plan add exercises and balance   Consulted and Agree with Plan of Care Patient        Problem List Patient Active Problem List   Diagnosis Date Noted  . Fall at home 12/01/2014  . Hypomagnesemia 04/24/2014  . Hemiparesis affecting left side as late effect of cerebrovascular accident 04/24/2014  . Routine general medical examination at a health care facility 05/16/2013  . Nontraumatic cerebral hemorrhage 04/30/2012  . HYPERGLYCEMIA, FASTING 03/04/2010  . OSTEOPENIA 01/21/2009  . UNSPECIFIED VITAMIN D DEFICIENCY 11/19/2007  . ESSENTIAL HYPERTENSION, BENIGN 11/19/2007  . HYPERCHOLESTEROLEMIA 10/25/2006  . GASTROESOPHAGEAL REFLUX, NO ESOPHAGITIS 10/25/2006  . DIVERTICULOSIS OF COLON 10/25/2006  . Osteoarthritis 10/25/2006  . CERVICAL SPINE DISORDER, NOS 10/25/2006    Sumner Boast, PT 12/15/2014, 11:00 AM  [place] [address] [place], [place], [address] Phone: [phone]   Fax:  [phone]

## 2014-12-18 ENCOUNTER — Ambulatory Visit: Payer: Medicare Other | Admitting: Physical Therapy

## 2014-12-18 ENCOUNTER — Encounter: Payer: Self-pay | Admitting: Physical Therapy

## 2014-12-18 DIAGNOSIS — R262 Difficulty in walking, not elsewhere classified: Secondary | ICD-10-CM | POA: Diagnosis not present

## 2014-12-18 DIAGNOSIS — R5381 Other malaise: Secondary | ICD-10-CM

## 2014-12-18 NOTE — Therapy (Signed)
[place], [place], [address] Phone: [phone]   Fax:  [phone]  Physical Therapy Treatment  Patient Details  Name: Karen Jackson MRN: [id number] Date of Birth: [birth year] Referring Provider:  Zenia Resides, MD  Encounter Date: 12/18/2014      PT End of Session - 12/18/14 1144    Visit Number 3   PT Start Time 1005   PT Stop Time 1053   PT Time Calculation (min) 48 min      Past Medical History  Diagnosis Date  . Arthritis   . Hyperlipidemia   . Hypertension   . Osteopenia     Past Surgical History  Procedure Laterality Date  . Ankle surgery    . Joint replacement      total- right partial- left  . Appendectomy    . Cholecystectomy    . Hernia repair      Esophagus  . Mastectomy partial / lumpectomy  2012    left    There were no vitals filed for this visit.  Visit Diagnosis:  Difficulty walking  Debility      Subjective Assessment - 12/18/14 1034    Subjective I was really tired, had to take an hour nap after last treatment, but it was good                         [facility] Adult PT Treatment/Exercise - 12/18/14 0001    Ambulation/Gait   Gait Comments HHA x 300 feet   High Level Balance   High Level Balance Activities Side stepping;Backward walking;Head turns   High Level Balance Comments ankle sit/fit exercises, 46" toe touches alternating, 4" step ups alternating, standing balance reaching, head turns   Lumbar Exercises: Aerobic   Tread Mill 1.0 MPH x 5 minutes   Lumbar Exercises: Machines for Strengthening   Leg Press 20# 2x10, left only with no weight 2x6           [facility]) - 12/18/14 1143    PWR! exercises Moves in sitting   PWR! Up 20   PWR! Rock 20   PWR! Twist 20               PT Short Term Goals - 12/11/14 1137    PT SHORT TERM GOAL #1   Title independent with initial HEP   Time 2   Period Weeks   Status New           PT Long Term Goals - 12/11/14 1138    PT LONG TERM GOAL #1   Title decrease timed up and go test to 25 seconds   Time 8   Period Weeks   Status New   PT LONG TERM GOAL #2   Title increased right LE strength to 4-/5   Time 8   Period Weeks   Status New   PT LONG TERM GOAL #3   Title increased left LE strength to 3+/5 for the hip and knee   Time 8   Period Weeks   Status New   PT LONG TERM GOAL #4   Title no falls   Time 8   Period Weeks   Status New   PT LONG TERM GOAL #5   Title walk 600 feet with supervision and FWW   Time 8   Period Weeks   Status New  Plan - 12/18/14 1144    Clinical Impression Statement Great difficulty with walking, left ankle rolls, left knee buckles, needing assist for gait.   PT Next Visit Plan add exercises and balance   Consulted and Agree with Plan of Care Patient        Problem List Patient Active Problem List   Diagnosis Date Noted  . Fall at home 12/01/2014  . Hypomagnesemia 04/24/2014  . Hemiparesis affecting left side as late effect of cerebrovascular accident 04/24/2014  . Routine general medical examination at a health care facility 05/16/2013  . Nontraumatic cerebral hemorrhage 04/30/2012  . HYPERGLYCEMIA, FASTING 03/04/2010  . OSTEOPENIA 01/21/2009  . UNSPECIFIED VITAMIN D DEFICIENCY 11/19/2007  . ESSENTIAL HYPERTENSION, BENIGN 11/19/2007  . HYPERCHOLESTEROLEMIA 10/25/2006  . GASTROESOPHAGEAL REFLUX, NO ESOPHAGITIS 10/25/2006  . DIVERTICULOSIS OF COLON 10/25/2006  . Osteoarthritis 10/25/2006  . CERVICAL SPINE DISORDER, NOS 10/25/2006    Sumner Boast, PT 12/18/2014, 11:46 AM  [address] [address] [place], [place], [address] Phone: [phone]   Fax:  [phone]

## 2014-12-21 ENCOUNTER — Ambulatory Visit: Payer: Medicare Other | Admitting: Physical Therapy

## 2014-12-21 ENCOUNTER — Encounter: Payer: Self-pay | Admitting: Physical Therapy

## 2014-12-21 DIAGNOSIS — M25572 Pain in left ankle and joints of left foot: Secondary | ICD-10-CM

## 2014-12-21 DIAGNOSIS — R262 Difficulty in walking, not elsewhere classified: Secondary | ICD-10-CM

## 2014-12-21 DIAGNOSIS — R5381 Other malaise: Secondary | ICD-10-CM

## 2014-12-21 NOTE — Therapy (Signed)
[place], [place], [address] Phone: [phone]   Fax:  [phone]  Physical Therapy Treatment  Patient Details  Name: Karen Jackson MRN: [id number] Date of Birth: 02-Nov-1940 Referring Provider:  Zenia Resides, MD  Encounter Date: 12/21/2014      PT End of Session - 12/21/14 1057    Visit Number 4   Date for PT Re-Evaluation 02/10/15   PT Start Time 1005   PT Stop Time 1057   PT Time Calculation (min) 52 min      Past Medical History  Diagnosis Date  . Arthritis   . Hyperlipidemia   . Hypertension   . Osteopenia     Past Surgical History  Procedure Laterality Date  . Ankle surgery    . Joint replacement      total- right partial- left  . Appendectomy    . Cholecystectomy    . Hernia repair      Esophagus  . Mastectomy partial / lumpectomy  2012    left    There were no vitals filed for this visit.  Visit Diagnosis:  Difficulty walking  Debility  Left ankle pain      Subjective Assessment - 12/21/14 1054    Subjective I get really tired after the treatments, but I am feeling better                         [facility] Adult PT Treatment/Exercise - 12/21/14 0001    Ambulation/Gait   Gait Comments HHA x 350 feet   High Level Balance   High Level Balance Activities Side stepping;Backward walking;Head turns  4" step [place], ball tossing and kicking   High Level Balance Comments ankle sit/fit exercises, 46" toe touches alternating, 4" step ups alternating, standing balance reaching, head turns   Lumbar Exercises: Aerobic   Tread Mill 1.0 MPH x 5 minutes   Lumbar Exercises: Machines for Strengthening   Leg Press 20# 2x10, left only with no weight 2x6                  PT Short Term Goals - 12/11/14 1137    PT SHORT TERM GOAL #1   Title independent with initial HEP   Time 2   Period Weeks   Status New           PT Long Term Goals -  12/11/14 1138    PT LONG TERM GOAL #1   Title decrease timed up and go test to 25 seconds   Time 8   Period Weeks   Status New   PT LONG TERM GOAL #2   Title increased right LE strength to 4-/5   Time 8   Period Weeks   Status New   PT LONG TERM GOAL #3   Title increased left LE strength to 3+/5 for the hip and knee   Time 8   Period Weeks   Status New   PT LONG TERM GOAL #4   Title no falls   Time 8   Period Weeks   Status New   PT LONG TERM GOAL #5   Title walk 600 feet with supervision and FWW   Time 8   Period Weeks   Status New               Plan - 12/21/14 1057    Clinical Impression Statement When she fatigues the left  knee starts to flex and she cannot straighten it out.  Has great difficulty with stepping up iwth the good leg, also has difficulty kicking with the affected leg   PT Next Visit Plan continue to progress balance and activities   Consulted and Agree with Plan of Care Patient        Problem List Patient Active Problem List   Diagnosis Date Noted  . Fall at home 12/01/2014  . Hypomagnesemia 04/24/2014  . Hemiparesis affecting left side as late effect of cerebrovascular accident 04/24/2014  . Routine general medical examination at a health care facility 05/16/2013  . Nontraumatic cerebral hemorrhage 04/30/2012  . HYPERGLYCEMIA, FASTING 03/04/2010  . OSTEOPENIA 01/21/2009  . UNSPECIFIED VITAMIN D DEFICIENCY 11/19/2007  . ESSENTIAL HYPERTENSION, BENIGN 11/19/2007  . HYPERCHOLESTEROLEMIA 10/25/2006  . GASTROESOPHAGEAL REFLUX, NO ESOPHAGITIS 10/25/2006  . DIVERTICULOSIS OF COLON 10/25/2006  . Osteoarthritis 10/25/2006  . CERVICAL SPINE DISORDER, NOS 10/25/2006    Sumner Boast, PT 12/21/2014, 10:59 AM  [place] [address] [place], [place], [address] Phone: [phone]   Fax:  [phone]

## 2014-12-24 ENCOUNTER — Ambulatory Visit: Payer: Medicare Other | Admitting: Physical Therapy

## 2014-12-24 DIAGNOSIS — R262 Difficulty in walking, not elsewhere classified: Secondary | ICD-10-CM | POA: Diagnosis not present

## 2014-12-24 NOTE — Therapy (Signed)
[place], [place], [address] Phone: [phone]   Fax:  [phone]  Physical Therapy Treatment  Patient Details  Name: Karen Jackson MRN: [id number] Date of Birth: [birth year] Referring Provider:  Zenia Resides, MD  Encounter Date: 12/24/2014      PT End of Session - 12/24/14 1423    Visit Number 5   PT Start Time 1310   PT Stop Time 1400   PT Time Calculation (min) 50 min      Past Medical History  Diagnosis Date  . Arthritis   . Hyperlipidemia   . Hypertension   . Osteopenia     Past Surgical History  Procedure Laterality Date  . Ankle surgery    . Joint replacement      total- right partial- left  . Appendectomy    . Cholecystectomy    . Hernia repair      Esophagus  . Mastectomy partial / lumpectomy  2012    left    There were no vitals filed for this visit.  Visit Diagnosis:  Difficulty walking      Subjective Assessment - 12/24/14 1419    Subjective fatigued after session but then okay,left knee and ankle give at times but no falls   Currently in Pain? Yes                         [facility] Adult PT Treatment/Exercise - 12/24/14 0001    Ambulation/Gait   Ambulation Surface Level   Gait Comments HHA x 350 feet  working on increased stride and speed   Posture/Postural Control   Posture Comments amb with SPC working on tracking Left and finding postits on wall for balance, mod A   High Level Balance   High Level Balance Activities Side stepping;Backward walking;Head turns  4" step [place], ball tossing and kicking   High Level Balance Comments trunk rotation with weight ball, ball toss with and without airex, step over airex   Lumbar Exercises: Aerobic   Elliptical NuStep L 4 x 6 minutes   Tread Mill 1.0 MPH x 5 minutes   Lumbar Exercises: Machines for Strengthening   Leg Press HHA squats 2 sets 10   Other Lumbar Machine Exercise standing 3# UE ther  ex balance and strength, shld flex,chest press and abd                  PT Short Term Goals - 12/24/14 1424    PT SHORT TERM GOAL #1   Title independent with initial HEP   Status On-going           PT Long Term Goals - 12/11/14 1138    PT LONG TERM GOAL #1   Title decrease timed up and go test to 25 seconds   Time 8   Period Weeks   Status New   PT LONG TERM GOAL #2   Title increased right LE strength to 4-/5   Time 8   Period Weeks   Status New   PT LONG TERM GOAL #3   Title increased left LE strength to 3+/5 for the hip and knee   Time 8   Period Weeks   Status New   PT LONG TERM GOAL #4   Title no falls   Time 8   Period Weeks   Status New   PT LONG TERM GOAL #5   Title walk  600 feet with supervision and FWW   Time 8   Period Weeks   Status New               Plan - 12/24/14 1423    Clinical Impression Statement pt very fearful with SPC and requires increased assitance, difficulty looking and reaching left while maintaining balance, worked on func weight shift and stepping with min A        Problem List Patient Active Problem List   Diagnosis Date Noted  . Fall at home 12/01/2014  . Hypomagnesemia 04/24/2014  . Hemiparesis affecting left side as late effect of cerebrovascular accident 04/24/2014  . Routine general medical examination at a health care facility 05/16/2013  . Nontraumatic cerebral hemorrhage 04/30/2012  . HYPERGLYCEMIA, FASTING 03/04/2010  . OSTEOPENIA 01/21/2009  . UNSPECIFIED VITAMIN D DEFICIENCY 11/19/2007  . ESSENTIAL HYPERTENSION, BENIGN 11/19/2007  . HYPERCHOLESTEROLEMIA 10/25/2006  . GASTROESOPHAGEAL REFLUX, NO ESOPHAGITIS 10/25/2006  . DIVERTICULOSIS OF COLON 10/25/2006  . Osteoarthritis 10/25/2006  . CERVICAL SPINE DISORDER, NOS 10/25/2006    ,ANGIE PTA  12/24/2014, 2:25 PM  [place] [address] [place], [place], [address] Phone:  [phone]   Fax:  [phone]

## 2014-12-28 ENCOUNTER — Ambulatory Visit: Payer: Medicare Other | Attending: Family Medicine | Admitting: Physical Therapy

## 2014-12-28 ENCOUNTER — Encounter: Payer: Self-pay | Admitting: Physical Therapy

## 2014-12-28 DIAGNOSIS — R262 Difficulty in walking, not elsewhere classified: Secondary | ICD-10-CM | POA: Diagnosis not present

## 2014-12-28 DIAGNOSIS — R5381 Other malaise: Secondary | ICD-10-CM

## 2014-12-28 DIAGNOSIS — M25572 Pain in left ankle and joints of left foot: Secondary | ICD-10-CM | POA: Diagnosis not present

## 2014-12-28 NOTE — Therapy (Signed)
[place], [place], [address] Phone: [phone]   Fax:  [phone]  Physical Therapy Treatment  Patient Details  Name: Karen Jackson MRN: [id number] Date of Birth: 1940/11/22 Referring Provider:  Zenia Resides, MD  Encounter Date: 12/28/2014      PT End of Session - 12/28/14 1054    Visit Number 6   Date for PT Re-Evaluation 02/10/15   PT Start Time 1005   PT Stop Time 1050   PT Time Calculation (min) 45 min      Past Medical History  Diagnosis Date  . Arthritis   . Hyperlipidemia   . Hypertension   . Osteopenia     Past Surgical History  Procedure Laterality Date  . Ankle surgery    . Joint replacement      total- right partial- left  . Appendectomy    . Cholecystectomy    . Hernia repair      Esophagus  . Mastectomy partial / lumpectomy  2012    left    There were no vitals filed for this visit.  Visit Diagnosis:  Difficulty walking  Debility      Subjective Assessment - 12/28/14 1050    Subjective No falls but reports that her ankle has rolled several times over the weekend   Currently in Pain? No/denies                         [facility] Adult PT Treatment/Exercise - 12/28/14 0001    Ambulation/Gait   Gait Comments HHA x 450 feet, she had 3 instances of the left knee going into hyperextension   High Level Balance   High Level Balance Activities Side stepping;Backward walking;Head turns   High Level Balance Comments trunk rotation with weight ball, ball toss with and without airex, step over airex   Lumbar Exercises: Aerobic   Tread Mill 1.0 MPH x 5 minutes                  PT Short Term Goals - 12/24/14 1424    PT SHORT TERM GOAL #1   Title independent with initial HEP   Status On-going           PT Long Term Goals - 12/11/14 1138    PT LONG TERM GOAL #1   Title decrease timed up and go test to 25 seconds   Time 8   Period  Weeks   Status New   PT LONG TERM GOAL #2   Title increased right LE strength to 4-/5   Time 8   Period Weeks   Status New   PT LONG TERM GOAL #3   Title increased left LE strength to 3+/5 for the hip and knee   Time 8   Period Weeks   Status New   PT LONG TERM GOAL #4   Title no falls   Time 8   Period Weeks   Status New   PT LONG TERM GOAL #5   Title walk 600 feet with supervision and FWW   Time 8   Period Weeks   Status New               Plan - 12/28/14 1055    Clinical Impression Statement She reports increased rolling of the left ankle, I restrapped her ankle brace.  Very hard for her to look to the left when unsupported.  PT Next Visit Plan continue to progress balance and activities   Consulted and Agree with Plan of Care Patient        Problem List Patient Active Problem List   Diagnosis Date Noted  . Fall at home 12/01/2014  . Hypomagnesemia 04/24/2014  . Hemiparesis affecting left side as late effect of cerebrovascular accident 04/24/2014  . Routine general medical examination at a health care facility 05/16/2013  . Nontraumatic cerebral hemorrhage 04/30/2012  . HYPERGLYCEMIA, FASTING 03/04/2010  . OSTEOPENIA 01/21/2009  . UNSPECIFIED VITAMIN D DEFICIENCY 11/19/2007  . ESSENTIAL HYPERTENSION, BENIGN 11/19/2007  . HYPERCHOLESTEROLEMIA 10/25/2006  . GASTROESOPHAGEAL REFLUX, NO ESOPHAGITIS 10/25/2006  . DIVERTICULOSIS OF COLON 10/25/2006  . Osteoarthritis 10/25/2006  . CERVICAL SPINE DISORDER, NOS 10/25/2006    Sumner Boast, PT 12/28/2014, 10:56 AM  [place] [address] [place], [place], [address] Phone: [phone]   Fax:  [phone]

## 2014-12-31 ENCOUNTER — Ambulatory Visit: Payer: Medicare Other | Admitting: Physical Therapy

## 2014-12-31 ENCOUNTER — Encounter: Payer: Self-pay | Admitting: Physical Therapy

## 2014-12-31 DIAGNOSIS — R5381 Other malaise: Secondary | ICD-10-CM

## 2014-12-31 DIAGNOSIS — R262 Difficulty in walking, not elsewhere classified: Secondary | ICD-10-CM | POA: Diagnosis not present

## 2014-12-31 NOTE — Therapy (Signed)
[place], [place], [address] Phone: [phone]   Fax:  [phone]  Physical Therapy Treatment  Patient Details  Name: Karen Jackson MRN: [id number] Date of Birth: [birth year] Referring Provider:  Zenia Resides, MD  Encounter Date: 12/31/2014      PT End of Session - 12/31/14 1157    Visit Number 7   Date for PT Re-Evaluation 02/10/15   PT Start Time 1056   PT Stop Time 1150   PT Time Calculation (min) 54 min      Past Medical History  Diagnosis Date  . Arthritis   . Hyperlipidemia   . Hypertension   . Osteopenia     Past Surgical History  Procedure Laterality Date  . Ankle surgery    . Joint replacement      total- right partial- left  . Appendectomy    . Cholecystectomy    . Hernia repair      Esophagus  . Mastectomy partial / lumpectomy  2012    left    There were no vitals filed for this visit.  Visit Diagnosis:  Difficulty walking  Debility      Subjective Assessment - 12/31/14 1155    Subjective Reports doing alright after last treatment.  Has had some left ankle pain.  Tremendous difficulty with balance and any step ups, due to left knee and ankle strength and coordination                         [facility] Adult PT Treatment/Exercise - 12/31/14 0001    Ambulation/Gait   Gait Comments HHA x 350 feet, cues needed for step length and speed   High Level Balance   High Level Balance Activities Side stepping;Backward walking;Head turns   High Level Balance Comments trunk rotation with weight ball, ball toss with and without airex, step over airex   Lumbar Exercises: Aerobic   Tread Mill 1.0 MPH x 6 minutes   Lumbar Exercises: Machines for Strengthening   Leg Press 20# 2x10, left leg only no weight 2x10   Other Lumbar Machine Exercise airex standing, 8" step toe touches, 4 " step ups                  PT Short Term Goals - 12/24/14 1424     PT SHORT TERM GOAL #1   Title independent with initial HEP   Status On-going           PT Long Term Goals - 12/31/14 1159    PT LONG TERM GOAL #1   Title decrease timed up and go test to 25 seconds   Status Partially Met   PT LONG TERM GOAL #2   Title increased right LE strength to 4-/5   Status On-going   PT LONG TERM GOAL #3   Title increased left LE strength to 3+/5 for the hip and knee   Status Partially Met               Plan - 12/31/14 1158    Clinical Impression Statement Patient continues to have great difficulty with left ankle and knee strength as well as coordination.  Balance and fear is a huge issue for her to be independent   PT Next Visit Plan continue to progress balance and activities   Consulted and Agree with Plan of Care Patient        Problem List  Patient Active Problem List   Diagnosis Date Noted  . Fall at home 12/01/2014  . Hypomagnesemia 04/24/2014  . Hemiparesis affecting left side as late effect of cerebrovascular accident 04/24/2014  . Routine general medical examination at a health care facility 05/16/2013  . Nontraumatic cerebral hemorrhage 04/30/2012  . HYPERGLYCEMIA, FASTING 03/04/2010  . OSTEOPENIA 01/21/2009  . UNSPECIFIED VITAMIN D DEFICIENCY 11/19/2007  . ESSENTIAL HYPERTENSION, BENIGN 11/19/2007  . HYPERCHOLESTEROLEMIA 10/25/2006  . GASTROESOPHAGEAL REFLUX, NO ESOPHAGITIS 10/25/2006  . DIVERTICULOSIS OF COLON 10/25/2006  . Osteoarthritis 10/25/2006  . CERVICAL SPINE DISORDER, NOS 10/25/2006    Sumner Boast, PT  12/31/2014, 12:13 PM  [place] [address] [place], [place], [address] Phone: [phone]   Fax:  [phone]

## 2015-01-04 ENCOUNTER — Ambulatory Visit: Payer: Medicare Other | Admitting: Physical Therapy

## 2015-01-04 ENCOUNTER — Encounter: Payer: Self-pay | Admitting: Physical Therapy

## 2015-01-04 DIAGNOSIS — R262 Difficulty in walking, not elsewhere classified: Secondary | ICD-10-CM | POA: Diagnosis not present

## 2015-01-04 DIAGNOSIS — R5381 Other malaise: Secondary | ICD-10-CM

## 2015-01-04 NOTE — Therapy (Signed)
[place], [place], [address] Phone: [phone]   Fax:  [phone]  Physical Therapy Treatment  Patient Details  Name: Karen Jackson MRN: [id number] Date of Birth: Sep 22, 1940 Referring Provider:  Zenia Resides, MD  Encounter Date: 01/04/2015      PT End of Session - 01/04/15 1157    Visit Number 8   Date for PT Re-Evaluation 02/10/15   PT Start Time 1057   PT Stop Time 1145   PT Time Calculation (min) 48 min      Past Medical History  Diagnosis Date  . Arthritis   . Hyperlipidemia   . Hypertension   . Osteopenia     Past Surgical History  Procedure Laterality Date  . Ankle surgery    . Joint replacement      total- right partial- left  . Appendectomy    . Cholecystectomy    . Hernia repair      Esophagus  . Mastectomy partial / lumpectomy  2012    left    There were no vitals filed for this visit.  Visit Diagnosis:  Difficulty walking  Debility                       [facility] Adult PT Treatment/Exercise - 01/04/15 0001    Ambulation/Gait   Gait Comments HHA x 350 feet, cues needed for step length and speed   High Level Balance   High Level Balance Activities Side stepping;Backward walking;Head turns   Lumbar Exercises: Aerobic   Tread Mill 1.0 MPH x 6 minutes   Lumbar Exercises: Machines for Strengthening   Cybex Knee Extension 10#   Cybex Knee Flexion 25#   Leg Press 20# 2x10, left leg only no weight 2x10   Other Lumbar Machine Exercise standing 3# UE ther ex balance and strength, shld flex,chest press and abd   Other Lumbar Machine Exercise airex standing, 8" step toe touches, 4 " step ups                  PT Short Term Goals - 12/24/14 1424    PT SHORT TERM GOAL #1   Title independent with initial HEP   Status On-going           PT Long Term Goals - 12/31/14 1159    PT LONG TERM GOAL #1   Title decrease timed up and go test  to 25 seconds   Status Partially Met   PT LONG TERM GOAL #2   Title increased right LE strength to 4-/5   Status On-going   PT LONG TERM GOAL #3   Title increased left LE strength to 3+/5 for the hip and knee   Status Partially Met               Plan - 01/04/15 1157    Clinical Impression Statement No falls recently, has had issues with the left ankle rolling and the left knee going into hyperextension and buckling, much of this happens when she is distracted.  Difficulty reaching to the left, and stepping up.   PT Next Visit Plan continue to progress balance and activities   Consulted and Agree with Plan of Care Patient        Problem List Patient Active Problem List   Diagnosis Date Noted  . Fall at home 12/01/2014  . Hypomagnesemia 04/24/2014  . Hemiparesis affecting left side as late effect  of cerebrovascular accident 04/24/2014  . Routine general medical examination at a health care facility 05/16/2013  . Nontraumatic cerebral hemorrhage 04/30/2012  . HYPERGLYCEMIA, FASTING 03/04/2010  . OSTEOPENIA 01/21/2009  . UNSPECIFIED VITAMIN D DEFICIENCY 11/19/2007  . ESSENTIAL HYPERTENSION, BENIGN 11/19/2007  . HYPERCHOLESTEROLEMIA 10/25/2006  . GASTROESOPHAGEAL REFLUX, NO ESOPHAGITIS 10/25/2006  . DIVERTICULOSIS OF COLON 10/25/2006  . Osteoarthritis 10/25/2006  . CERVICAL SPINE DISORDER, NOS 10/25/2006    Sumner Boast, PT 01/04/2015, 11:59 AM  [place] [address] [place], [place], [address] Phone: [phone]   Fax:  [phone]

## 2015-01-07 ENCOUNTER — Ambulatory Visit: Payer: Medicare Other | Admitting: Physical Therapy

## 2015-01-07 ENCOUNTER — Encounter: Payer: Self-pay | Admitting: Physical Therapy

## 2015-01-07 DIAGNOSIS — R262 Difficulty in walking, not elsewhere classified: Secondary | ICD-10-CM

## 2015-01-07 DIAGNOSIS — R5381 Other malaise: Secondary | ICD-10-CM

## 2015-01-07 NOTE — Therapy (Signed)
[place], [place], [address] Phone: [phone]   Fax:  [phone]  Physical Therapy Treatment  Patient Details  Name: Karen Jackson MRN: [id number] Date of Birth: [birth year] Referring Provider:  Zenia Resides, MD  Encounter Date: 01/07/2015      PT End of Session - 01/07/15 1145    Visit Number 9   Date for PT Re-Evaluation 02/10/15   PT Start Time 1010   PT Stop Time 1100   PT Time Calculation (min) 50 min      Past Medical History  Diagnosis Date  . Arthritis   . Hyperlipidemia   . Hypertension   . Osteopenia     Past Surgical History  Procedure Laterality Date  . Ankle surgery    . Joint replacement      total- right partial- left  . Appendectomy    . Cholecystectomy    . Hernia repair      Esophagus  . Mastectomy partial / lumpectomy  2012    left    There were no vitals filed for this visit.  Visit Diagnosis:  Difficulty walking  Debility      Subjective Assessment - 01/07/15 1142    Subjective No problems, continues to have some left ankle and knee instability, no falls   Currently in Pain? No/denies                         [facility] Adult PT Treatment/Exercise - 01/07/15 0001    Ambulation/Gait   Gait Comments HHA x 350 feet, cues needed for step length and speed   High Level Balance   High Level Balance Activities Side stepping;Backward walking;Head turns   High Level Balance Comments trunk rotation with weight ball, ball toss with and without airex, step over airex, 8" toe touches, 4" step ups   Lumbar Exercises: Aerobic   Elliptical NuStep L 4 x 6 minutes   Lumbar Exercises: Machines for Strengthening   Leg Press 20# 2x10, left leg only no weight 2x10           [facility]) - 01/07/15 1144    PWR! Up 20   PWR! Rock 20   PWR! Twist 20               PT Short Term Goals - 12/24/14 1424    PT SHORT TERM GOAL #1   Title  independent with initial HEP   Status On-going           PT Long Term Goals - 01/07/15 1146    PT LONG TERM GOAL #4   Title no falls   Status Partially Met               Plan - 01/07/15 1145    Clinical Impression Statement Has left ankle and knee instability, especially with fatigue.  She has not had any falls, she fatigues quickly and fear seems to hold her back from doing things at home   PT Next Visit Plan continue to progress balance and activities   Consulted and Agree with Plan of Care Patient        Problem List Patient Active Problem List   Diagnosis Date Noted  . Fall at home 12/01/2014  . Hypomagnesemia 04/24/2014  . Hemiparesis affecting left side as late effect of cerebrovascular accident 04/24/2014  . Routine general medical examination at a health care facility 05/16/2013  .  Nontraumatic cerebral hemorrhage 04/30/2012  . HYPERGLYCEMIA, FASTING 03/04/2010  . OSTEOPENIA 01/21/2009  . UNSPECIFIED VITAMIN D DEFICIENCY 11/19/2007  . ESSENTIAL HYPERTENSION, BENIGN 11/19/2007  . HYPERCHOLESTEROLEMIA 10/25/2006  . GASTROESOPHAGEAL REFLUX, NO ESOPHAGITIS 10/25/2006  . DIVERTICULOSIS OF COLON 10/25/2006  . Osteoarthritis 10/25/2006  . CERVICAL SPINE DISORDER, NOS 10/25/2006    Sumner Boast, PT 01/07/2015, 11:47 AM  [place] [address] [place], [place], [address] Phone: [phone]   Fax:  [phone]

## 2015-01-12 ENCOUNTER — Encounter: Payer: Self-pay | Admitting: Physical Therapy

## 2015-01-12 ENCOUNTER — Ambulatory Visit: Payer: Medicare Other | Admitting: Physical Therapy

## 2015-01-12 DIAGNOSIS — R262 Difficulty in walking, not elsewhere classified: Secondary | ICD-10-CM | POA: Diagnosis not present

## 2015-01-12 DIAGNOSIS — M25572 Pain in left ankle and joints of left foot: Secondary | ICD-10-CM

## 2015-01-12 DIAGNOSIS — R5381 Other malaise: Secondary | ICD-10-CM

## 2015-01-12 NOTE — Therapy (Signed)
[place], [place], [address] Phone: [phone]   Fax:  [phone]  Physical Therapy Treatment  Patient Details  Name: Karen Jackson MRN: [id number] Date of Birth: 1941-04-18 Referring Provider:  Zenia Resides, MD  Encounter Date: 16-Jan-2015      PT End of Session - Jan 16, 2015 1146    Visit Number 10   Date for PT Re-Evaluation 02/10/15   PT Start Time 1055   PT Stop Time 1145   PT Time Calculation (min) 50 min   Activity Tolerance Patient limited by fatigue      Past Medical History  Diagnosis Date  . Arthritis   . Hyperlipidemia   . Hypertension   . Osteopenia     Past Surgical History  Procedure Laterality Date  . Ankle surgery    . Joint replacement      total- right partial- left  . Appendectomy    . Cholecystectomy    . Hernia repair      Esophagus  . Mastectomy partial / lumpectomy  2012    left    There were no vitals filed for this visit.  Visit Diagnosis:  Difficulty walking  Debility  Left ankle pain      Subjective Assessment - Jan 16, 2015 1145    Subjective No doing well today.  Just feel stiff and not very mobile   Currently in Pain? No/denies                         [facility] Adult PT Treatment/Exercise - 16-Jan-2015 0001    Ambulation/Gait   Gait Comments HHA x 350 feet, cues needed for step length and speed   High Level Balance   High Level Balance Activities Side stepping;Backward walking;Head turns   High Level Balance Comments trunk rotation with weight ball, ball toss with and without airex, step over airex, 8" toe touches, 4" step ups   Lumbar Exercises: Aerobic   Stationary Bike 5 minutes   Tread Mill 1.0 MPH x 6 minutes   Lumbar Exercises: Machines for Strengthening   Leg Press 20# 2x10, left leg only no weight 2x10   Other Lumbar Machine Exercise airex standing, 8" step toe touches, 4 " step ups                  PT  Short Term Goals - 12/24/14 1424    PT SHORT TERM GOAL #1   Title independent with initial HEP   Status On-going           PT Long Term Goals - 01/07/15 1146    PT LONG TERM GOAL #4   Title no falls   Status Partially Met               Plan - 01-16-2015 1217    Clinical Impression Statement She did very poorly with treatment today, left knee buckling and left ankle rolling.  She had a near fall today and had to sit rapidly.   PT Next Visit Plan continue to progress balance and activities   Consulted and Agree with Plan of Care Patient          G-Codes - 01/16/2015 1225    Functional Assessment Tool Used FOTO   Functional Limitation Mobility: Walking and moving around   Mobility: Walking and Moving Around Current Status ([id number]) At least 60 percent but less than 80 percent impaired, limited or restricted  Mobility: Walking and Moving Around Goal Status [phone]) At least 40 percent but less than 60 percent impaired, limited or restricted      Problem List Patient Active Problem List   Diagnosis Date Noted  . Fall at home 12/01/2014  . Hypomagnesemia 04/24/2014  . Hemiparesis affecting left side as late effect of cerebrovascular accident 04/24/2014  . Routine general medical examination at a health care facility 05/16/2013  . Nontraumatic cerebral hemorrhage 04/30/2012  . HYPERGLYCEMIA, FASTING 03/04/2010  . OSTEOPENIA 01/21/2009  . UNSPECIFIED VITAMIN D DEFICIENCY 11/19/2007  . ESSENTIAL HYPERTENSION, BENIGN 11/19/2007  . HYPERCHOLESTEROLEMIA 10/25/2006  . GASTROESOPHAGEAL REFLUX, NO ESOPHAGITIS 10/25/2006  . DIVERTICULOSIS OF COLON 10/25/2006  . Osteoarthritis 10/25/2006  . CERVICAL SPINE DISORDER, NOS 10/25/2006    Sumner Boast., PT 01/12/2015, 12:26 PM  [place] [address] [place], [place], [address] Phone: [phone]   Fax:  [phone]

## 2015-01-15 ENCOUNTER — Ambulatory Visit: Payer: Medicare Other | Admitting: Physical Therapy

## 2015-01-15 ENCOUNTER — Encounter: Payer: Self-pay | Admitting: Physical Therapy

## 2015-01-15 DIAGNOSIS — R262 Difficulty in walking, not elsewhere classified: Secondary | ICD-10-CM

## 2015-01-15 DIAGNOSIS — R5381 Other malaise: Secondary | ICD-10-CM

## 2015-01-15 NOTE — Therapy (Signed)
[place], [place], [address] Phone: [phone]   Fax:  [phone]  Physical Therapy Treatment  Patient Details  Name: Karen Jackson MRN: [id number] Date of Birth: 1940-09-12 Referring Provider:  Zenia Resides, MD  Encounter Date: 01/15/2015      PT End of Session - 01/15/15 1200    Visit Number 11   Date for PT Re-Evaluation 02/10/15   PT Start Time 1059   PT Stop Time 1145   PT Time Calculation (min) 46 min      Past Medical History  Diagnosis Date  . Arthritis   . Hyperlipidemia   . Hypertension   . Osteopenia     Past Surgical History  Procedure Laterality Date  . Ankle surgery    . Joint replacement      total- right partial- left  . Appendectomy    . Cholecystectomy    . Hernia repair      Esophagus  . Mastectomy partial / lumpectomy  2012    left    There were no vitals filed for this visit.  Visit Diagnosis:  Difficulty walking  Debility      Subjective Assessment - 01/15/15 1151    Subjective Feeling better today.  Not many issues of the ankle rolling the last few days.   Currently in Pain? Yes   Pain Score 1    Pain Location Ankle   Pain Orientation Left                         [facility] Adult PT Treatment/Exercise - 01/15/15 0001    Ambulation/Gait   Gait Comments HHA x 350 feet, cues needed for step length and speed   High Level Balance   High Level Balance Comments trunk rotation with weight ball, ball toss with and without airex, step over airex, 8" toe touches, 4" step ups   Lumbar Exercises: Aerobic   Tread Mill 1.0 MPH x 6 minutes   Lumbar Exercises: Machines for Strengthening   Leg Press 20# 2x10, left leg only no weight 2x10   Other Lumbar Machine Exercise standing 3# UE ther ex balance and strength, shld flex,chest press and abd   Other Lumbar Machine Exercise airex standing, 8" step toe touches, 4 " step ups                   PT Short Term Goals - 12/24/14 1424    PT SHORT TERM GOAL #1   Title independent with initial HEP   Status On-going           PT Long Term Goals - 01/07/15 1146    PT LONG TERM GOAL #4   Title no falls   Status Partially Met               Plan - 01/15/15 1201    Clinical Impression Statement Much better today than last treatment, she had only one instance of the left ankle rolling, but corrected on own, and one instance of the left knee buckling on the leg press   PT Next Visit Plan continue to progress balance and activities   Consulted and Agree with Plan of Care Patient        Problem List Patient Active Problem List   Diagnosis Date Noted  . Fall at home 12/01/2014  . Hypomagnesemia 04/24/2014  . Hemiparesis affecting left side as late effect of  cerebrovascular accident 04/24/2014  . Routine general medical examination at a health care facility 05/16/2013  . Nontraumatic cerebral hemorrhage 04/30/2012  . HYPERGLYCEMIA, FASTING 03/04/2010  . OSTEOPENIA 01/21/2009  . UNSPECIFIED VITAMIN D DEFICIENCY 11/19/2007  . ESSENTIAL HYPERTENSION, BENIGN 11/19/2007  . HYPERCHOLESTEROLEMIA 10/25/2006  . GASTROESOPHAGEAL REFLUX, NO ESOPHAGITIS 10/25/2006  . DIVERTICULOSIS OF COLON 10/25/2006  . Osteoarthritis 10/25/2006  . CERVICAL SPINE DISORDER, NOS 10/25/2006    Sumner Boast., PT 01/15/2015, 12:02 PM  [place] [address] [place], [place], [address] Phone: [phone]   Fax:  [phone]

## 2015-01-18 ENCOUNTER — Ambulatory Visit: Payer: Medicare Other | Admitting: Physical Therapy

## 2015-01-18 ENCOUNTER — Encounter: Payer: Self-pay | Admitting: Physical Therapy

## 2015-01-18 DIAGNOSIS — R5381 Other malaise: Secondary | ICD-10-CM

## 2015-01-18 DIAGNOSIS — R262 Difficulty in walking, not elsewhere classified: Secondary | ICD-10-CM | POA: Diagnosis not present

## 2015-01-18 NOTE — Therapy (Signed)
[place], [place], [address] Phone: [phone]   Fax:  [phone]  Physical Therapy Treatment  Patient Details  Name: Karen Jackson MRN: [id number] Date of Birth: 01-06-1941 Referring Provider:  Zenia Resides, MD  Encounter Date: 01/18/2015      PT End of Session - 01/18/15 1154    Visit Number 12   Date for PT Re-Evaluation 02/10/15   PT Start Time 1055   PT Stop Time 1146   PT Time Calculation (min) 51 min      Past Medical History  Diagnosis Date  . Arthritis   . Hyperlipidemia   . Hypertension   . Osteopenia     Past Surgical History  Procedure Laterality Date  . Ankle surgery    . Joint replacement      total- right partial- left  . Appendectomy    . Cholecystectomy    . Hernia repair      Esophagus  . Mastectomy partial / lumpectomy  2012    left    There were no vitals filed for this visit.  Visit Diagnosis:  Difficulty walking  Debility      Subjective Assessment - 01/18/15 1147    Subjective Really stiff, the cold and rainy weather this weekend just really got my joints all over   Currently in Pain? No/denies                         [facility] Adult PT Treatment/Exercise - 01/18/15 0001    Ambulation/Gait   Gait Comments HHA x 350 feet, cues needed for step length and speed   High Level Balance   High Level Balance Activities Side stepping;Backward walking;Direction changes;Turns;Head turns;Tandem walking;Marching forwards;Weight-shifting turns   High Level Balance Comments trunk rotation with weight ball, ball toss with and without airex, step over airex, 8" toe touches, 4" step ups, PWR moves in sitting and standing, work on stanidng weight shifts and trusting the left leg, some sit to stand without arm   Lumbar Exercises: Aerobic   Stationary Bike 5 minutes   Tread Mill 1.0 MPH x 6 minutes                  PT Short Term Goals  - 12/24/14 1424    PT SHORT TERM GOAL #1   Title independent with initial HEP   Status On-going           PT Long Term Goals - 01/07/15 1146    PT LONG TERM GOAL #4   Title no falls   Status Partially Met               Plan - 01/18/15 1154    Clinical Impression Statement Still has difficulty transferring weight onto the left leg, when getting up without hands really puts all of the weight on the right leg and this causes the left hip to rotate backwards, causing to sit quiclkly        Problem List Patient Active Problem List   Diagnosis Date Noted  . Fall at home 12/01/2014  . Hypomagnesemia 04/24/2014  . Hemiparesis affecting left side as late effect of cerebrovascular accident 04/24/2014  . Routine general medical examination at a health care facility 05/16/2013  . Nontraumatic cerebral hemorrhage 04/30/2012  . HYPERGLYCEMIA, FASTING 03/04/2010  . OSTEOPENIA 01/21/2009  . UNSPECIFIED VITAMIN D DEFICIENCY 11/19/2007  . ESSENTIAL HYPERTENSION, BENIGN 11/19/2007  .  HYPERCHOLESTEROLEMIA 10/25/2006  . GASTROESOPHAGEAL REFLUX, NO ESOPHAGITIS 10/25/2006  . DIVERTICULOSIS OF COLON 10/25/2006  . Osteoarthritis 10/25/2006  . CERVICAL SPINE DISORDER, NOS 10/25/2006    Sumner Boast., PT 01/18/2015, 11:56 AM  [address] [place], [place], [address] Phone: [phone]   Fax:  [phone]

## 2015-01-19 DIAGNOSIS — Z1231 Encounter for screening mammogram for malignant neoplasm of breast: Secondary | ICD-10-CM | POA: Diagnosis not present

## 2015-01-21 ENCOUNTER — Ambulatory Visit: Payer: Medicare Other | Admitting: Physical Therapy

## 2015-01-21 ENCOUNTER — Encounter: Payer: Self-pay | Admitting: Physical Therapy

## 2015-01-21 DIAGNOSIS — R262 Difficulty in walking, not elsewhere classified: Secondary | ICD-10-CM | POA: Diagnosis not present

## 2015-01-21 DIAGNOSIS — R5381 Other malaise: Secondary | ICD-10-CM

## 2015-01-21 NOTE — Therapy (Signed)
[place], [place], [address] Phone: [phone]   Fax:  [phone]  Physical Therapy Treatment  Patient Details  Name: Karen Jackson MRN: [id number] Date of Birth: 04/10/1941 Referring Provider:  Zenia Resides, MD  Encounter Date: 01/21/2015      PT End of Session - 01/21/15 1131    Visit Number 13   Date for PT Re-Evaluation 02/10/15   PT Start Time 1053   PT Stop Time 1145   PT Time Calculation (min) 52 min      Past Medical History  Diagnosis Date  . Arthritis   . Hyperlipidemia   . Hypertension   . Osteopenia     Past Surgical History  Procedure Laterality Date  . Ankle surgery    . Joint replacement      total- right partial- left  . Appendectomy    . Cholecystectomy    . Hernia repair      Esophagus  . Mastectomy partial / lumpectomy  2012    left    There were no vitals filed for this visit.  Visit Diagnosis:  Difficulty walking  Debility      Subjective Assessment - 01/21/15 1104    Subjective No falls, really swollen after sitting up for long periods.  C/O left knee going into hyperextension a few times a day                         [facility] Adult PT Treatment/Exercise - 01/21/15 0001    Ambulation/Gait   Gait Comments HHA x 350 feet, cues needed for step length and speed   High Level Balance   High Level Balance Activities Side stepping;Backward walking;Direction changes;Turns;Head turns;Tandem walking;Marching forwards;Weight-shifting turns   High Level Balance Comments trunk rotation with weight ball, ball toss with and without airex, step over airex, 8" toe touches, 4" step ups, PWR moves in sitting and standing, work on stanidng weight shifts and trusting the left leg, some sit to stand without arm   Lumbar Exercises: Aerobic   Tread Mill 1.0 MPH x 6 minutes   Lumbar Exercises: Machines for Strengthening   Leg Press 20# 2x10, left leg  only no weight 2x10           PWR [facility]) - 01/21/15 1125    PWR! Up 20   PWR! Rock 20   PWR! Twist 20               PT Short Term Goals - 12/24/14 1424    PT SHORT TERM GOAL #1   Title independent with initial HEP   Status On-going           PT Long Term Goals - 01/21/15 1148    PT LONG TERM GOAL #2   Title increased right LE strength to 4-/5   Status On-going   PT LONG TERM GOAL #3   Title increased left LE strength to 3+/5 for the hip and knee   Status Partially Met   PT LONG TERM GOAL #4   Title no falls   Status Achieved               Plan - 01/21/15 1146    Clinical Impression Statement Left knee buckles often when fatigued, she had 4 episodes of buckling during session today, she has spasticity of the left UE with acitivity.  Great difficulty stepping up with right leg, possibly  due to not clearing left toe   PT Next Visit Plan Work on coordination of the left LE and strength   Consulted and Agree with Plan of Care Patient        Problem List Patient Active Problem List   Diagnosis Date Noted  . Fall at home 12/01/2014  . Hypomagnesemia 04/24/2014  . Hemiparesis affecting left side as late effect of cerebrovascular accident 04/24/2014  . Routine general medical examination at a health care facility 05/16/2013  . Nontraumatic cerebral hemorrhage 04/30/2012  . HYPERGLYCEMIA, FASTING 03/04/2010  . OSTEOPENIA 01/21/2009  . UNSPECIFIED VITAMIN D DEFICIENCY 11/19/2007  . ESSENTIAL HYPERTENSION, BENIGN 11/19/2007  . HYPERCHOLESTEROLEMIA 10/25/2006  . GASTROESOPHAGEAL REFLUX, NO ESOPHAGITIS 10/25/2006  . DIVERTICULOSIS OF COLON 10/25/2006  . Osteoarthritis 10/25/2006  . CERVICAL SPINE DISORDER, NOS 10/25/2006    Sumner Boast., PT 01/21/2015, 11:49 AM  [place] [address] [place], [place], [address] Phone: [phone]   Fax:  [phone]

## 2015-01-26 ENCOUNTER — Ambulatory Visit: Payer: Medicare Other | Admitting: Physical Therapy

## 2015-01-26 ENCOUNTER — Encounter: Payer: Self-pay | Admitting: Physical Therapy

## 2015-01-26 DIAGNOSIS — R262 Difficulty in walking, not elsewhere classified: Secondary | ICD-10-CM | POA: Diagnosis not present

## 2015-01-26 DIAGNOSIS — R5381 Other malaise: Secondary | ICD-10-CM

## 2015-01-26 NOTE — Therapy (Signed)
[place], [place], [address] Phone: [phone]   Fax:  [phone]  Physical Therapy Treatment  Patient Details  Name: Karen Jackson MRN: [id number] Date of Birth: 1940-09-28 Referring Provider:  Kristeen Miss, MD  Encounter Date: 01/26/2015      PT End of Session - 01/26/15 1258    Visit Number 14   Date for PT Re-Evaluation 02/10/15   PT Start Time 1053   PT Stop Time 1150   PT Time Calculation (min) 57 min      Past Medical History  Diagnosis Date  . Arthritis   . Hyperlipidemia   . Hypertension   . Osteopenia     Past Surgical History  Procedure Laterality Date  . Ankle surgery    . Joint replacement      total- right partial- left  . Appendectomy    . Cholecystectomy    . Hernia repair      Esophagus  . Mastectomy partial / lumpectomy  2012    left    There were no vitals filed for this visit.  Visit Diagnosis:  Difficulty walking  Debility      Subjective Assessment - 01/26/15 1253    Subjective No falls.  Reports that she did not do anything over the long weekend.  She reports that she is very stiff and not walking well today due to not being active   Currently in Pain? No/denies                         [facility] Adult PT Treatment/Exercise - 01/26/15 0001    Ambulation/Gait   Gait Comments 280 feet HHA cues to pick up the left leg and for bigger steps   High Level Balance   High Level Balance Activities Side stepping;Backward walking;Direction changes;Turns;Head turns;Marching forwards;Negotitating around obstacles   High Level Balance Comments trunk rotation with weight ball, ball toss with and without airex, step over airex, 8" toe touches, 4" step ups, PWR moves in sitting and standing, work on stanidng weight shifts and trusting the left leg, some sit to stand without arm   Lumbar Exercises: Aerobic   Tread Mill 1.0 MPH x 6 minutes   Lumbar  Exercises: Machines for Strengthening   Other Lumbar Machine Exercise red tband left ankle eversion and DF, 4# left leg LAQ's and marching, sitting left hip IR with 4# weight, joint weight bearing throught he left UE, standing cone transfers working on weight shift and trunk rotation                  PT Short Term Goals - 12/24/14 1424    PT SHORT TERM GOAL #1   Title independent with initial HEP   Status On-going           PT Long Term Goals - 01/26/15 1301    PT LONG TERM GOAL #2   Title increased right LE strength to 4-/5   Status On-going               Plan - 01/26/15 1259    Clinical Impression Statement Having much more difficulty with motions, left extremity, left foot drag and walking, reports that she did not do much "and it shows"   PT Next Visit Plan continue to work on her ability to function safely   Consulted and Agree with Plan of Care Patient  Problem List Patient Active Problem List   Diagnosis Date Noted  . Fall at home 12/01/2014  . Hypomagnesemia 04/24/2014  . Hemiparesis affecting left side as late effect of cerebrovascular accident 04/24/2014  . Routine general medical examination at a health care facility 05/16/2013  . Nontraumatic cerebral hemorrhage 04/30/2012  . HYPERGLYCEMIA, FASTING 03/04/2010  . OSTEOPENIA 01/21/2009  . UNSPECIFIED VITAMIN D DEFICIENCY 11/19/2007  . ESSENTIAL HYPERTENSION, BENIGN 11/19/2007  . HYPERCHOLESTEROLEMIA 10/25/2006  . GASTROESOPHAGEAL REFLUX, NO ESOPHAGITIS 10/25/2006  . DIVERTICULOSIS OF COLON 10/25/2006  . Osteoarthritis 10/25/2006  . CERVICAL SPINE DISORDER, NOS 10/25/2006    Sumner Boast., PT 01/26/2015, 1:02 PM  [address] [place], [place], [address] Phone: [phone]   Fax:  [phone]

## 2015-01-29 ENCOUNTER — Encounter: Payer: Self-pay | Admitting: Physical Therapy

## 2015-01-29 ENCOUNTER — Ambulatory Visit: Payer: Medicare Other | Attending: Neurological Surgery | Admitting: Physical Therapy

## 2015-01-29 DIAGNOSIS — R262 Difficulty in walking, not elsewhere classified: Secondary | ICD-10-CM

## 2015-01-29 DIAGNOSIS — R5381 Other malaise: Secondary | ICD-10-CM | POA: Diagnosis not present

## 2015-01-29 NOTE — Therapy (Signed)
[place] [place], [place], [address] Phone: [phone]   Fax:  [phone]  Physical Therapy Treatment  Patient Details  Name: Karen Jackson MRN: [id number] Date of Birth: 11-09-1940 Referring Provider:  Kristeen Miss, MD  Encounter Date: 01/29/2015      PT End of Session - 01/29/15 1133    Visit Number 15   Date for PT Re-Evaluation 02/10/15   PT Start Time 4132   PT Stop Time 1140   PT Time Calculation (min) 48 min      Past Medical History  Diagnosis Date  . Arthritis   . Hyperlipidemia   . Hypertension   . Osteopenia     Past Surgical History  Procedure Laterality Date  . Ankle surgery    . Joint replacement      total- right partial- left  . Appendectomy    . Cholecystectomy    . Hernia repair      Esophagus  . Mastectomy partial / lumpectomy  2012    left    There were no vitals filed for this visit.  Visit Diagnosis:  Debility  Difficulty walking      Subjective Assessment - 01/29/15 1128    Subjective not doing as well as yesterday                         [facility] Adult PT Treatment/Exercise - 01/29/15 0001    Ambulation/Gait   Gait Comments 150 feet 2 times HHA cues to pick up the left leg and for bigger steps as well as to look fwd vs down at feet.   High Level Balance   High Level Balance Activities --  standing weighted ball toss, OH press and chest press.   High Level Balance Comments trunk rotation with weight ball, ball toss with and without airex, step over airex, 8" toe touches, 4" step ups, PWR moves in sitting and standing, work on stanidng weight shifts and trusting the left leg, some sit to stand without arm  trunk roation and PNF seated with red tabnd   Lumbar Exercises: Aerobic   Elliptical NuStep L 5 x 6 minutes   Lumbar Exercises: Machines for Strengthening   Leg Press 40# 5 times, 20# 2 sets 10   Other Lumbar Machine Exercise red  tband hip ext,abd and ext 10 reps each standing with RW                  PT Short Term Goals - 01/29/15 1141    PT SHORT TERM GOAL #1   Title independent with initial HEP   Status On-going           PT Long Term Goals - 01/29/15 1141    PT LONG TERM GOAL #1   Title decrease timed up and go test to 25 seconds   Status On-going   PT LONG TERM GOAL #2   Title increased right LE strength to 4-/5   Status On-going   PT LONG TERM GOAL #3   Title increased left LE strength to 3+/5 for the hip and knee   Status Partially Met   PT LONG TERM GOAL #4   Title no falls   Status Achieved               Plan - 01/29/15 1139    Clinical Impression Statement pt with decreased posterior LOB with standing weighted ball activitiesa nd  decreased ability to keep left hand on ball, improved cadeance and equal step lengths with HHA gait   PT Next Visit Plan continue to work on her ability to function safely        Problem List Patient Active Problem List   Diagnosis Date Noted  . Fall at home 12/01/2014  . Hypomagnesemia 04/24/2014  . Hemiparesis affecting left side as late effect of cerebrovascular accident 04/24/2014  . Routine general medical examination at a health care facility 05/16/2013  . Nontraumatic cerebral hemorrhage 04/30/2012  . HYPERGLYCEMIA, FASTING 03/04/2010  . OSTEOPENIA 01/21/2009  . UNSPECIFIED VITAMIN D DEFICIENCY 11/19/2007  . ESSENTIAL HYPERTENSION, BENIGN 11/19/2007  . HYPERCHOLESTEROLEMIA 10/25/2006  . GASTROESOPHAGEAL REFLUX, NO ESOPHAGITIS 10/25/2006  . DIVERTICULOSIS OF COLON 10/25/2006  . Osteoarthritis 10/25/2006  . CERVICAL SPINE DISORDER, NOS 10/25/2006    ,ANGIE PTA 01/29/2015, 11:42 AM  [address] [place], [place], [address] Phone: [phone]   Fax:  [phone]

## 2015-02-02 ENCOUNTER — Encounter: Payer: Self-pay | Admitting: Physical Therapy

## 2015-02-02 ENCOUNTER — Ambulatory Visit: Payer: Medicare Other | Admitting: Physical Therapy

## 2015-02-02 DIAGNOSIS — R5381 Other malaise: Secondary | ICD-10-CM | POA: Diagnosis not present

## 2015-02-02 DIAGNOSIS — R262 Difficulty in walking, not elsewhere classified: Secondary | ICD-10-CM

## 2015-02-02 NOTE — Therapy (Signed)
[place], [place], [address] Phone: [phone]   Fax:  [phone]  Physical Therapy Treatment  Patient Details  Name: Karen Jackson MRN: [id number] Date of Birth: 04/25/1941 Referring Provider:  Zenia Resides, MD  Encounter Date: 02/02/2015      PT End of Session - 02/02/15 1137    Visit Number 16   Date for PT Re-Evaluation 02/10/15   PT Start Time 8984   PT Stop Time 1139   PT Time Calculation (min) 47 min   Activity Tolerance Patient limited by fatigue      Past Medical History  Diagnosis Date  . Arthritis   . Hyperlipidemia   . Hypertension   . Osteopenia     Past Surgical History  Procedure Laterality Date  . Ankle surgery    . Joint replacement      total- right partial- left  . Appendectomy    . Cholecystectomy    . Hernia repair      Esophagus  . Mastectomy partial / lumpectomy  2012    left    There were no vitals filed for this visit.  Visit Diagnosis:  Debility  Difficulty walking      Subjective Assessment - 02/02/15 1110    Subjective Okay, feel like I am moving better, I have tried to stay active over the weekend                         [facility] Adult PT Treatment/Exercise - 02/02/15 0001    Ambulation/Gait   Gait Comments 190 with HHA and cues for speed   High Level Balance   High Level Balance Activities Side stepping;Backward walking;Sudden stops;Head turns;Tandem walking   High Level Balance Comments trunk rotation with weight ball, ball toss with and without airex, step over airex, 8" toe touches, 4" step ups, PWR moves in sitting and standing, work on stanidng weight shifts and trusting the left leg, some sit to stand without arm   Lumbar Exercises: Aerobic   Tread Mill 1.0 MPH x 6 minutes   Lumbar Exercises: Machines for Strengthening   Leg Press 20# eccentrics left leg only                  PT Short Term Goals -  01/29/15 1141    PT SHORT TERM GOAL #1   Title independent with initial HEP   Status On-going           PT Long Term Goals - 01/29/15 1141    PT LONG TERM GOAL #1   Title decrease timed up and go test to 25 seconds   Status On-going   PT LONG TERM GOAL #2   Title increased right LE strength to 4-/5   Status On-going   PT LONG TERM GOAL #3   Title increased left LE strength to 3+/5 for the hip and knee   Status Partially Met   PT LONG TERM GOAL #4   Title no falls   Status Achieved               Plan - 02/02/15 1139    Clinical Impression Statement Patient with confidence issuea dns some left side neglect.  This causes her at times to be unsafe and off balance.  She also remains very fearful and is apprehensive to test her self or be more independent   PT Next Visit Plan continue  to work on her ability to function safely   Consulted and Agree with Plan of Care Patient        Problem List Patient Active Problem List   Diagnosis Date Noted  . Fall at home 12/01/2014  . Hypomagnesemia 04/24/2014  . Hemiparesis affecting left side as late effect of cerebrovascular accident 04/24/2014  . Routine general medical examination at a health care facility 05/16/2013  . Nontraumatic cerebral hemorrhage 04/30/2012  . HYPERGLYCEMIA, FASTING 03/04/2010  . OSTEOPENIA 01/21/2009  . UNSPECIFIED VITAMIN D DEFICIENCY 11/19/2007  . ESSENTIAL HYPERTENSION, BENIGN 11/19/2007  . HYPERCHOLESTEROLEMIA 10/25/2006  . GASTROESOPHAGEAL REFLUX, NO ESOPHAGITIS 10/25/2006  . DIVERTICULOSIS OF COLON 10/25/2006  . Osteoarthritis 10/25/2006  . CERVICAL SPINE DISORDER, NOS 10/25/2006    Sumner Boast., PT 02/02/2015, 11:41 AM  [address] [place], [place], [address] Phone: [phone]   Fax:  [phone]

## 2015-02-04 ENCOUNTER — Ambulatory Visit: Payer: Medicare Other | Admitting: Physical Therapy

## 2015-02-04 ENCOUNTER — Encounter: Payer: Self-pay | Admitting: Physical Therapy

## 2015-02-04 DIAGNOSIS — R5381 Other malaise: Secondary | ICD-10-CM

## 2015-02-04 DIAGNOSIS — R262 Difficulty in walking, not elsewhere classified: Secondary | ICD-10-CM

## 2015-02-04 NOTE — Therapy (Signed)
[place], [place], [address] Phone: [phone]   Fax:  [phone]  Physical Therapy Treatment  Patient Details  Name: Karen Jackson MRN: [id number] Date of Birth: November 22, 1940 Referring Provider:  Zenia Resides, MD  Encounter Date: 02/04/2015      PT End of Session - 02/04/15 1146    Visit Number 17   PT Start Time 1054   PT Stop Time 1140   PT Time Calculation (min) 46 min   Activity Tolerance Patient limited by fatigue      Past Medical History  Diagnosis Date  . Arthritis   . Hyperlipidemia   . Hypertension   . Osteopenia     Past Surgical History  Procedure Laterality Date  . Ankle surgery    . Joint replacement      total- right partial- left  . Appendectomy    . Cholecystectomy    . Hernia repair      Esophagus  . Mastectomy partial / lumpectomy  2012    left    There were no vitals filed for this visit.  Visit Diagnosis:  Debility  Difficulty walking      Subjective Assessment - 02/04/15 1141    Subjective Doing better, I am feeling stronger.  No falls   Currently in Pain? No/denies                         [facility] Adult PT Treatment/Exercise - 02/04/15 0001    Ambulation/Gait   Gait Comments 190 feet and then 300 feet with HHA, some cues for step length, posture and left foot placement   High Level Balance   High Level Balance Activities Side stepping;Backward walking;Sudden stops;Head turns;Tandem walking   High Level Balance Comments 4" step ups, standing balance reaching activities   Lumbar Exercises: Aerobic   Elliptical NuStep L 5 x 6 minutes   Lumbar Exercises: Machines for Strengthening   Other Lumbar Machine Exercise red tband left ankle eversion and DF, 4# left leg LAQ's and marching, sitting left hip IR with 4# weight, joint weight bearing throught he left UE, standing cone transfers working on weight shift and trunk rotation   Other Lumbar Machine Exercise calve stretches                  PT Short Term Goals - 01/29/15 1141    PT SHORT TERM GOAL #1   Title independent with initial HEP   Status On-going           PT Long Term Goals - 02/04/15 1147    PT LONG TERM GOAL #3   Title increased left LE strength to 3+/5 for the hip and knee   Status Achieved               Plan - 02/04/15 1146    Clinical Impression Statement Much improved today, has issues with fatigue but overall less buckling and turning of the knee and ankle   PT Next Visit Plan continue to work on her ability to function safely   Consulted and Agree with Plan of Care Patient        Problem List Patient Active Problem List   Diagnosis Date Noted  . Fall at home 12/01/2014  . Hypomagnesemia 04/24/2014  . Hemiparesis affecting left side as late effect of cerebrovascular accident 04/24/2014  . Routine general medical examination at a health care facility 05/16/2013  .  Nontraumatic cerebral hemorrhage 04/30/2012  . HYPERGLYCEMIA, FASTING 03/04/2010  . OSTEOPENIA 01/21/2009  . UNSPECIFIED VITAMIN D DEFICIENCY 11/19/2007  . ESSENTIAL HYPERTENSION, BENIGN 11/19/2007  . HYPERCHOLESTEROLEMIA 10/25/2006  . GASTROESOPHAGEAL REFLUX, NO ESOPHAGITIS 10/25/2006  . DIVERTICULOSIS OF COLON 10/25/2006  . Osteoarthritis 10/25/2006  . CERVICAL SPINE DISORDER, NOS 10/25/2006    Sumner Boast., PT 02/04/2015, 11:48 AM  [address] [place], [place], [address] Phone: [phone]   Fax:  [phone]

## 2015-02-08 ENCOUNTER — Ambulatory Visit: Payer: Medicare Other | Admitting: Physical Therapy

## 2015-02-08 ENCOUNTER — Encounter: Payer: Self-pay | Admitting: Physical Therapy

## 2015-02-08 DIAGNOSIS — R5381 Other malaise: Secondary | ICD-10-CM

## 2015-02-08 DIAGNOSIS — R262 Difficulty in walking, not elsewhere classified: Secondary | ICD-10-CM

## 2015-02-08 NOTE — Therapy (Signed)
[place], [place], [address] Phone: [phone]   Fax:  [phone]  Physical Therapy Treatment  Patient Details  Name: Karen Jackson MRN: [id number] Date of Birth: 1941/03/06 Referring Provider:  Zenia Resides, MD  Encounter Date: 02/08/2015      PT End of Session - 02/08/15 1109    Visit Number 18   PT Start Time 1011   PT Stop Time 1057   PT Time Calculation (min) 46 min   Activity Tolerance Patient limited by fatigue      Past Medical History  Diagnosis Date  . Arthritis   . Hyperlipidemia   . Hypertension   . Osteopenia     Past Surgical History  Procedure Laterality Date  . Ankle surgery    . Joint replacement      total- right partial- left  . Appendectomy    . Cholecystectomy    . Hernia repair      Esophagus  . Mastectomy partial / lumpectomy  2012    left    There were no vitals filed for this visit.  Visit Diagnosis:  Difficulty walking  Debility      Subjective Assessment - 02/08/15 1101    Subjective No falls.  I am a little stiff from not doing much over the weekend.   Currently in Pain? No/denies                         [facility] Adult PT Treatment/Exercise - 02/08/15 0001    Ambulation/Gait   Gait Comments 190 feet and then 300 feet with HHA, some cues for step length, posture and left foot placement   High Level Balance   High Level Balance Comments 4" step ups, 8 " toe touches   Lumbar Exercises: Aerobic   Tread Mill 1.0 MPH x 6 minutes   Lumbar Exercises: Machines for Strengthening   Leg Press 20# 2x10, left only no weight 2x10, left with 20# eccentrics x 10   Other Lumbar Machine Exercise red tband left ankle eversion and DF, 4# left leg LAQ's and marching, sitting left hip IR with 4# weight, joint weight bearing throught he left UE, standing cone transfers working on weight shift and trunk rotation   Other Lumbar Machine Exercise  calve stretches           [facility]) - 02/08/15 1108    PWR! exercises Moves in sitting   PWR! Up 20   PWR! Rock 20   PWR! Twist 20               PT Short Term Goals - 01/29/15 1141    PT SHORT TERM GOAL #1   Title independent with initial HEP   Status On-going           PT Long Term Goals - 02/08/15 1111    PT LONG TERM GOAL #1   Title decrease timed up and go test to 25 seconds   Status Partially Met   PT LONG TERM GOAL #2   Title increased right LE strength to 4-/5   Status Partially Met               Plan - 02/08/15 1109    Clinical Impression Statement Patient did great with step ups today.  continues to have neglect of the left side, at times is unsafe due to not picking up left foot, she reports  that she has less buckling or rolling of the left ankle and knee   PT Frequency 2x / week   PT Duration 2 weeks   PT Next Visit Plan Write renewal, plan for advanced HEP and teach caregivers how to do, plan to d/c in the next few weeks   Consulted and Agree with Plan of Care Patient        Problem List Patient Active Problem List   Diagnosis Date Noted  . Fall at home 12/01/2014  . Hypomagnesemia 04/24/2014  . Hemiparesis affecting left side as late effect of cerebrovascular accident 04/24/2014  . Routine general medical examination at a health care facility 05/16/2013  . Nontraumatic cerebral hemorrhage 04/30/2012  . HYPERGLYCEMIA, FASTING 03/04/2010  . OSTEOPENIA 01/21/2009  . UNSPECIFIED VITAMIN D DEFICIENCY 11/19/2007  . ESSENTIAL HYPERTENSION, BENIGN 11/19/2007  . HYPERCHOLESTEROLEMIA 10/25/2006  . GASTROESOPHAGEAL REFLUX, NO ESOPHAGITIS 10/25/2006  . DIVERTICULOSIS OF COLON 10/25/2006  . Osteoarthritis 10/25/2006  . CERVICAL SPINE DISORDER, NOS 10/25/2006    Sumner Boast., PT 02/08/2015, 11:12 AM  [place] [place] [address] [place], [place], [address] Phone:  [phone]   Fax:  [phone]

## 2015-02-11 ENCOUNTER — Ambulatory Visit: Payer: Medicare Other | Admitting: Physical Therapy

## 2015-02-11 ENCOUNTER — Encounter: Payer: Self-pay | Admitting: Physical Therapy

## 2015-02-11 DIAGNOSIS — R5381 Other malaise: Secondary | ICD-10-CM

## 2015-02-11 DIAGNOSIS — R262 Difficulty in walking, not elsewhere classified: Secondary | ICD-10-CM

## 2015-02-11 NOTE — Therapy (Signed)
[place], [place], [address] Phone: [phone]   Fax:  [phone]  Physical Therapy Treatment  Patient Details  Name: Karen Jackson MRN: [id number] Date of Birth: 1941-01-11 Referring Provider:  Zenia Resides, MD  Encounter Date: 02/11/2015      PT End of Session - 02/11/15 1153    Visit Number 19   Date for PT Re-Evaluation 02/26/15   PT Start Time 1050   PT Stop Time 1145   PT Time Calculation (min) 55 min   Activity Tolerance Patient limited by fatigue      Past Medical History  Diagnosis Date  . Arthritis   . Hyperlipidemia   . Hypertension   . Osteopenia     Past Surgical History  Procedure Laterality Date  . Ankle surgery    . Joint replacement      total- right partial- left  . Appendectomy    . Cholecystectomy    . Hernia repair      Esophagus  . Mastectomy partial / lumpectomy  2012    left    There were no vitals filed for this visit.  Visit Diagnosis:  Difficulty walking - Plan: PT plan of care cert/re-cert  Debility - Plan: PT plan of care cert/re-cert      Subjective Assessment - 02/11/15 1103    Subjective Did better, feeling better, trying to exercise on my own at home.   Currently in Pain? No/denies            [facility] PT Assessment - 02/11/15 0001    Timed Up and Go Test   Normal TUG (seconds) 25                     [facility] Adult PT Treatment/Exercise - 02/11/15 0001    Ambulation/Gait   Gait Comments gait with HHA 300 feet x 2 with assist to speed up and cues to pick up the left foot and look ahead.   High Level Balance   High Level Balance Activities Side stepping;Backward walking;Sudden stops;Head turns;Tandem walking   High Level Balance Comments 4" step ups, 8 " toe touches   Lumbar Exercises: Aerobic   Stationary Bike 6 minutes   Lumbar Exercises: Machines for Strengthening   Leg Press 10# 2x10, left only 20# eccentrics,  left only no weight   Other Lumbar Machine Exercise red tband left ankle eversion and DF, 4# left leg LAQ's and marching, sitting left hip IR with 4# weight, joint weight bearing throught he left UE, standing cone transfers working on weight shift and trunk rotation                  PT Short Term Goals - 01/29/15 1141    PT SHORT TERM GOAL #1   Title independent with initial HEP   Status On-going           PT Long Term Goals - 02/11/15 1156    PT LONG TERM GOAL #1   Title decrease timed up and go test to 25 seconds   Status Achieved               Plan - 02/11/15 1154    Clinical Impression Statement The left knee and ankle continues to fatigue easily, after exercises she tends to have some increased difficulty with toe clearing and occasional left knee buckling.  She is starting to understand that she needs to be active  everyday.   PT Next Visit Plan renewal done today, Foto to be done next visit with plan to have her doing independent gym exercises by July 1st.   Consulted and Agree with Plan of Care Patient        Problem List Patient Active Problem List   Diagnosis Date Noted  . Fall at home 12/01/2014  . Hypomagnesemia 04/24/2014  . Hemiparesis affecting left side as late effect of cerebrovascular accident 04/24/2014  . Routine general medical examination at a health care facility 05/16/2013  . Nontraumatic cerebral hemorrhage 04/30/2012  . HYPERGLYCEMIA, FASTING 03/04/2010  . OSTEOPENIA 01/21/2009  . UNSPECIFIED VITAMIN D DEFICIENCY 11/19/2007  . ESSENTIAL HYPERTENSION, BENIGN 11/19/2007  . HYPERCHOLESTEROLEMIA 10/25/2006  . GASTROESOPHAGEAL REFLUX, NO ESOPHAGITIS 10/25/2006  . DIVERTICULOSIS OF COLON 10/25/2006  . Osteoarthritis 10/25/2006  . CERVICAL SPINE DISORDER, NOS 10/25/2006    Sumner Boast., PT 02/11/2015, 11:58 AM  [place] [address] [place], [place],  [address] Phone: [phone]   Fax:  [phone]

## 2015-02-15 ENCOUNTER — Ambulatory Visit: Payer: Medicare Other | Admitting: Physical Therapy

## 2015-02-15 ENCOUNTER — Encounter: Payer: Self-pay | Admitting: Family Medicine

## 2015-02-15 ENCOUNTER — Encounter: Payer: Self-pay | Admitting: Physical Therapy

## 2015-02-15 DIAGNOSIS — R5381 Other malaise: Secondary | ICD-10-CM

## 2015-02-15 DIAGNOSIS — R262 Difficulty in walking, not elsewhere classified: Secondary | ICD-10-CM

## 2015-02-15 NOTE — Therapy (Signed)
[place], [place], [address] Phone: [phone]   Fax:  [phone]  Physical Therapy Treatment  Patient Details  Name: Karen Jackson MRN: [id number] Date of Birth: Jan 04, 1941 Referring Provider:  Zenia Resides, MD  Encounter Date: 02-24-15      PT End of Session - 02-24-2015 1203    Visit Number 20   Date for PT Re-Evaluation 02/26/15   Activity Tolerance Patient limited by fatigue      Past Medical History  Diagnosis Date  . Arthritis   . Hyperlipidemia   . Hypertension   . Osteopenia     Past Surgical History  Procedure Laterality Date  . Ankle surgery    . Joint replacement      total- right partial- left  . Appendectomy    . Cholecystectomy    . Hernia repair      Esophagus  . Mastectomy partial / lumpectomy  2010-12-05    left    There were no vitals filed for this visit.  Visit Diagnosis:  Difficulty walking  Debility      Subjective Assessment - Feb 24, 2015 1201    Subjective I have had no falls and no stumbles over the weekend, I tried to get up every now and then, to stay active   Currently in Pain? No/denies                         [facility] Adult PT Treatment/Exercise - February 24, 2015 0001    Ambulation/Gait   Gait Comments gait with HHA 300 feet x 2 with assist to speed up and cues to pick up the left foot and look ahead.   High Level Balance   High Level Balance Activities Side stepping;Backward walking;Sudden stops;Head turns;Tandem walking   High Level Balance Comments 4" step ups, 8 " toe touches   Lumbar Exercises: Aerobic   Tread Mill 1.0 MPH x 6 minutes   Lumbar Exercises: Machines for Strengthening   Leg Press 10# 2x10, left only 20# eccentrics, left only no weight           [facility]) - Feb 24, 2015 12-04-00    PWR! Up 20   PWR! Rock 20   PWR! Twist 20               PT Short Term Goals - 01/29/15 1141    PT SHORT TERM GOAL #1   Title independent with initial HEP   Status On-going           PT Long Term Goals - 02/11/15 1156    PT LONG TERM GOAL #1   Title decrease timed up and go test to 25 seconds   Status Achieved               Plan - 02/24/2015 1203    Clinical Impression Statement Seems to be doing exercises at home, which is helping with her stay at her level and not decline as she was doing   PT Next Visit Plan Foto completed today, plan to teach HEP in gym   Consulted and Agree with Plan of Care Patient          G-Codes - 24-Feb-2015 12/04/05    Functional Assessment Tool Used FOTO   Functional Limitation Mobility: Walking and moving around   Mobility: Walking and Moving Around Current Status (Q1194) At least 40 percent but less than 60 percent impaired, limited or restricted  Mobility: Walking and Moving Around Goal Status [phone]) At least 40 percent but less than 60 percent impaired, limited or restricted      Problem List Patient Active Problem List   Diagnosis Date Noted  . Fall at home 12/01/2014  . Hypomagnesemia 04/24/2014  . Hemiparesis affecting left side as late effect of cerebrovascular accident 04/24/2014  . Routine general medical examination at a health care facility 05/16/2013  . Nontraumatic cerebral hemorrhage 04/30/2012  . HYPERGLYCEMIA, FASTING 03/04/2010  . OSTEOPENIA 01/21/2009  . UNSPECIFIED VITAMIN D DEFICIENCY 11/19/2007  . ESSENTIAL HYPERTENSION, BENIGN 11/19/2007  . HYPERCHOLESTEROLEMIA 10/25/2006  . GASTROESOPHAGEAL REFLUX, NO ESOPHAGITIS 10/25/2006  . DIVERTICULOSIS OF COLON 10/25/2006  . Osteoarthritis 10/25/2006  . CERVICAL SPINE DISORDER, NOS 10/25/2006    Sumner Boast., PT 02/15/2015, 12:08 PM  [place] [address] [place], [place], [address] Phone: [phone]   Fax:  [phone]

## 2015-02-18 ENCOUNTER — Encounter: Payer: Self-pay | Admitting: Physical Therapy

## 2015-02-18 ENCOUNTER — Ambulatory Visit: Payer: Medicare Other | Admitting: Physical Therapy

## 2015-02-18 DIAGNOSIS — R262 Difficulty in walking, not elsewhere classified: Secondary | ICD-10-CM

## 2015-02-18 DIAGNOSIS — R5381 Other malaise: Secondary | ICD-10-CM

## 2015-02-18 NOTE — Therapy (Signed)
[place], [place], [address] Phone: [phone]   Fax:  [phone]  Physical Therapy Treatment  Patient Details  Name: Karen Jackson MRN: [id number] Date of Birth: 1940/12/02 Referring Provider:  Zenia Resides, MD  Encounter Date: 02/18/2015      PT End of Session - 02/18/15 1147    Visit Number 21   PT Start Time 1005   PT Stop Time 1048   PT Time Calculation (min) 43 min   Activity Tolerance Patient tolerated treatment well      Past Medical History  Diagnosis Date  . Arthritis   . Hyperlipidemia   . Hypertension   . Osteopenia     Past Surgical History  Procedure Laterality Date  . Ankle surgery    . Joint replacement      total- right partial- left  . Appendectomy    . Cholecystectomy    . Hernia repair      Esophagus  . Mastectomy partial / lumpectomy  2012    left    There were no vitals filed for this visit.  Visit Diagnosis:  Difficulty walking  Debility      Subjective Assessment - 02/18/15 1140    Subjective  a little swollen after sitting   Currently in Pain? No/denies            [facility] PT Assessment - 02/18/15 0001    Transfers   Stand Pivot Transfers 7: Independent   Timed Up and Go Test   Normal TUG (seconds) 22                     [facility] Adult PT Treatment/Exercise - 02/18/15 0001    Ambulation/Gait   Gait Comments 180 feet HHA x 2 cue to big steps   High Level Balance   High Level Balance Activities Side stepping;Backward walking;Sudden stops;Head turns;Tandem walking   High Level Balance Comments 4" step ups, 8 " toe touches   Lumbar Exercises: Aerobic   Tread Mill 1.0 MPH x 6 minutes   Lumbar Exercises: Machines for Strengthening   Leg Press 20# bilateral, left leg only no weight and then 20# left only eccentrics   Other Lumbar Machine Exercise calve stretches                  PT Short Term Goals - 01/29/15  1141    PT SHORT TERM GOAL #1   Title independent with initial HEP   Status On-going           PT Long Term Goals - 02/18/15 1149    PT LONG TERM GOAL #2   Title increased right LE strength to 4-/5   Status Achieved   PT LONG TERM GOAL #3   Title increased left LE strength to 3+/5 for the hip and knee   Status Achieved               Plan - 02/18/15 1148    Clinical Impression Statement Increased ability to walk during TUG test.  Reports that she is using grab rails to walk in the bathroom and not using her walker   PT Next Visit Plan teach gym HEP to her and her care giver the next two visits   Consulted and Agree with Plan of Care Patient        Problem List Patient Active Problem List   Diagnosis Date Noted  . Fall at  home 12/01/2014  . Hypomagnesemia 04/24/2014  . Hemiparesis affecting left side as late effect of cerebrovascular accident 04/24/2014  . Routine general medical examination at a health care facility 05/16/2013  . Nontraumatic cerebral hemorrhage 04/30/2012  . HYPERGLYCEMIA, FASTING 03/04/2010  . OSTEOPENIA 01/21/2009  . UNSPECIFIED VITAMIN D DEFICIENCY 11/19/2007  . ESSENTIAL HYPERTENSION, BENIGN 11/19/2007  . HYPERCHOLESTEROLEMIA 10/25/2006  . GASTROESOPHAGEAL REFLUX, NO ESOPHAGITIS 10/25/2006  . DIVERTICULOSIS OF COLON 10/25/2006  . Osteoarthritis 10/25/2006  . CERVICAL SPINE DISORDER, NOS 10/25/2006    Sumner Boast., PT 02/18/2015, 11:51 AM  [place] [address] [place], [place], [address] Phone: [phone]   Fax:  [phone]

## 2015-02-23 ENCOUNTER — Encounter: Payer: Self-pay | Admitting: Physical Therapy

## 2015-02-23 ENCOUNTER — Ambulatory Visit: Payer: Medicare Other | Admitting: Physical Therapy

## 2015-02-23 DIAGNOSIS — R5381 Other malaise: Secondary | ICD-10-CM | POA: Diagnosis not present

## 2015-02-23 DIAGNOSIS — R262 Difficulty in walking, not elsewhere classified: Secondary | ICD-10-CM

## 2015-02-23 NOTE — Therapy (Signed)
[place], [place], [address] Phone: [phone]   Fax:  [phone]  Physical Therapy Treatment  Patient Details  Name: Karen Jackson MRN: [id number] Date of Birth: 1941/03/16 Referring Provider:  Zenia Resides, MD  Encounter Date: 02/23/2015      PT End of Session - 02/23/15 1330    Visit Number 22   PT Start Time 1050   PT Stop Time 1135   PT Time Calculation (min) 45 min   Equipment Utilized During Treatment Other (comment)   Activity Tolerance Patient limited by fatigue      Past Medical History  Diagnosis Date  . Arthritis   . Hyperlipidemia   . Hypertension   . Osteopenia     Past Surgical History  Procedure Laterality Date  . Ankle surgery    . Joint replacement      total- right partial- left  . Appendectomy    . Cholecystectomy    . Hernia repair      Esophagus  . Mastectomy partial / lumpectomy  2012    left    There were no vitals filed for this visit.  Visit Diagnosis:  Difficulty walking  Debility      Subjective Assessment - 02/23/15 1313    Subjective I am really stiff today, harder for me to get up from sitting and I have been catching my left toe.   Currently in Pain? No/denies   Multiple Pain Sites Yes                         [facility] Adult PT Treatment/Exercise - 02/23/15 0001    Ambulation/Gait   Gait Comments HHA x 300 feet on uneven surfaces,  had some instances x 2 of the left foot dragging and requiring Mod A to keep from falling, then HHa x 180 fet with cues to bend knee and pick up foot.   High Level Balance   High Level Balance Comments 4" step ups, 8" toe touches   Lumbar Exercises: Aerobic   Tread Mill 1.0 MPH x 6 minutes   Lumbar Exercises: Machines for Strengthening   Leg Press 20# 2x10 reps, cue to not snap left knee back                PT Education - 02/23/15 1328    Education provided Yes   Education  Details PT educated patient and care giver in use of gym equipment; Tmill, leg press, 4" stepups and 8" toe touches, went over safety, how to gaurd and adjust machines as we are trying to transition to independent gym program   Person(s) Educated Patient;Caregiver(s)   Methods Explanation;Demonstration;Verbal cues   Comprehension Verbalized understanding;Returned demonstration;Verbal cues required          PT Short Term Goals - 01/29/15 1141    PT SHORT TERM GOAL #1   Title independent with initial HEP   Status On-going           PT Long Term Goals - 02/18/15 1149    PT LONG TERM GOAL #2   Title increased right LE strength to 4-/5   Status Achieved   PT LONG TERM GOAL #3   Title increased left LE strength to 3+/5 for the hip and knee   Status Achieved               Plan - 02/23/15 1333    Clinical  Impression Statement Noted increased fatigue with walking more today, 2 times required ModA to right after catching foot.  Will continue to need cues for gym safety as she transitions to independent   PT Next Visit Plan continue with education of safety in gym and will d/c   Consulted and Agree with Plan of Care Patient        Problem List Patient Active Problem List   Diagnosis Date Noted  . Fall at home 12/01/2014  . Hypomagnesemia 04/24/2014  . Hemiparesis affecting left side as late effect of cerebrovascular accident 04/24/2014  . Routine general medical examination at a health care facility 05/16/2013  . Nontraumatic cerebral hemorrhage 04/30/2012  . HYPERGLYCEMIA, FASTING 03/04/2010  . OSTEOPENIA 01/21/2009  . UNSPECIFIED VITAMIN D DEFICIENCY 11/19/2007  . ESSENTIAL HYPERTENSION, BENIGN 11/19/2007  . HYPERCHOLESTEROLEMIA 10/25/2006  . GASTROESOPHAGEAL REFLUX, NO ESOPHAGITIS 10/25/2006  . DIVERTICULOSIS OF COLON 10/25/2006  . Osteoarthritis 10/25/2006  . CERVICAL SPINE DISORDER, NOS 10/25/2006    Sumner Boast., PT 02/23/2015, 1:52 PM  [place] [address] [place], [place], [address] Phone: [phone]   Fax:  [phone]

## 2015-02-25 ENCOUNTER — Encounter: Payer: Self-pay | Admitting: Physical Therapy

## 2015-02-25 ENCOUNTER — Ambulatory Visit: Payer: Medicare Other | Admitting: Physical Therapy

## 2015-02-25 DIAGNOSIS — R5381 Other malaise: Secondary | ICD-10-CM | POA: Diagnosis not present

## 2015-02-25 DIAGNOSIS — R262 Difficulty in walking, not elsewhere classified: Secondary | ICD-10-CM

## 2015-02-25 NOTE — Therapy (Signed)
[address] [address] [place], [place], [address] Phone: [phone]   Fax:  [phone]  February 25, 2015   _0 @  Physical Therapy Discharge Summary  Patient: Karen Jackson  MRN: [id number]  Date of Birth: 1941/01/26   Diagnosis: Difficulty walking  Debility Referring Provider:  Zenia Resides, MD  The above patient had been seen in Physical Therapy 23 times   The patient is: improved  Subjective: No falls in the past 8 weeks, feels that her and caregiver can continue with gym program    Functional Status at Discharge: transfers independently, walks with 4 [facility] community distances, can walk in the house short distances with cane  Goals met      Plan - 02/25/15 1201    Clinical Impression Statement Her and her caregiver feel that they can perform gym activity with some guidance   PT Next Visit Plan D/C and have her do gym on own with her caregiver   Consulted and Agree with Plan of Care Patient      Sincerely,   Sumner Boast, PT   CC _1 @  [place], [place], [address] Phone: [phone]   Fax:  [phone]

## 2015-06-17 ENCOUNTER — Encounter: Payer: Medicare Other | Admitting: Family Medicine

## 2015-06-17 ENCOUNTER — Ambulatory Visit (INDEPENDENT_AMBULATORY_CARE_PROVIDER_SITE_OTHER): Payer: Medicare Other | Admitting: Family Medicine

## 2015-06-17 ENCOUNTER — Encounter: Payer: Self-pay | Admitting: Family Medicine

## 2015-06-17 VITALS — BP 133/70 | HR 74 | Temp 98.5°F | Ht <= 58 in | Wt 176.9 lb

## 2015-06-17 DIAGNOSIS — M150 Primary generalized (osteo)arthritis: Secondary | ICD-10-CM

## 2015-06-17 DIAGNOSIS — Z23 Encounter for immunization: Secondary | ICD-10-CM

## 2015-06-17 DIAGNOSIS — I10 Essential (primary) hypertension: Secondary | ICD-10-CM

## 2015-06-17 DIAGNOSIS — E7800 Pure hypercholesterolemia, unspecified: Secondary | ICD-10-CM | POA: Diagnosis not present

## 2015-06-17 DIAGNOSIS — R7309 Other abnormal glucose: Secondary | ICD-10-CM

## 2015-06-17 DIAGNOSIS — M159 Polyosteoarthritis, unspecified: Secondary | ICD-10-CM

## 2015-06-17 DIAGNOSIS — E119 Type 2 diabetes mellitus without complications: Secondary | ICD-10-CM | POA: Diagnosis not present

## 2015-06-17 LAB — CBC
HEMATOCRIT: 46.7 % — AB (ref 36.0–46.0)
Hemoglobin: 16.5 g/dL — ABNORMAL HIGH (ref 12.0–15.0)
MCH: 32.4 pg (ref 26.0–34.0)
MCHC: 35.3 g/dL (ref 30.0–36.0)
MCV: 91.6 fL (ref 78.0–100.0)
MPV: 9.9 fL (ref 8.6–12.4)
Platelets: 365 10*3/uL (ref 150–400)
RBC: 5.1 MIL/uL (ref 3.87–5.11)
RDW: 12.9 % (ref 11.5–15.5)
WBC: 5.8 10*3/uL (ref 4.0–10.5)

## 2015-06-17 LAB — LIPID PANEL
CHOL/HDL RATIO: 2.5 ratio (ref <=5.0)
CHOLESTEROL: 177 mg/dL (ref 125–200)
HDL: 71 mg/dL (ref >=46)
LDL CALC: 83 mg/dL (ref <130)
Triglycerides: 113 mg/dL (ref <150)
VLDL: 23 mg/dL (ref <30)

## 2015-06-17 LAB — COMPREHENSIVE METABOLIC PANEL
ALT: 20 U/L (ref 6–29)
AST: 21 U/L (ref 10–35)
Albumin: 4.4 g/dL (ref 3.6–5.1)
Alkaline Phosphatase: 61 U/L (ref 33–130)
BUN: 11 mg/dL (ref 7–25)
CO2: 30 mmol/L (ref 20–31)
Calcium: 9.7 mg/dL (ref 8.6–10.4)
Chloride: 94 mmol/L — ABNORMAL LOW (ref 98–110)
Creat: 0.41 mg/dL — ABNORMAL LOW (ref 0.60–0.93)
GLUCOSE: 130 mg/dL — AB (ref 65–99)
POTASSIUM: 3.6 mmol/L (ref 3.5–5.3)
Sodium: 135 mmol/L (ref 135–146)
Total Bilirubin: 0.9 mg/dL (ref 0.2–1.2)
Total Protein: 7.3 g/dL (ref 6.1–8.1)

## 2015-06-17 LAB — MAGNESIUM: Magnesium: 1.9 mg/dL (ref 1.5–2.5)

## 2015-06-17 LAB — POCT GLYCOSYLATED HEMOGLOBIN (HGB A1C): HEMOGLOBIN A1C: 6.7

## 2015-06-17 MED ORDER — DICLOFENAC SODIUM 1 % TD GEL: 2.0000 g | Freq: Four times a day (QID) | TRANSDERMAL | Status: DC

## 2015-06-17 NOTE — Patient Instructions (Signed)
I will call with blood test results. I sent in a prescription for the voltarin gel Let me know when you want me to reorder physical therapy. Wait another year for the mammogram We will quit on colonoscopies.

## 2015-06-17 NOTE — Assessment & Plan Note (Signed)
Controled, check labs

## 2015-06-17 NOTE — Assessment & Plan Note (Signed)
Check labs 

## 2015-06-17 NOTE — Progress Notes (Signed)
   Subjective:    Patient ID: Karen Jackson, female    DOB: Feb 03, 1941, 74 y.o.   MRN: [id number]  HPI Here for follow up of cerebral hemorrhage with residual left hemiparesis.  Several issues: 1. Needed insurance form filled out.  Done. 2. Recheck BP Asymptomatic.  Denies CP or SOB.  No further neuro events. States compliance is good. 3. Left hemiparesis.  Struggles to maintain mobility.  In assisted living.  Periodic PT helps.  4. Hx of hypomag on replacement., Needs check. 5. Hx of hyperglycemia.  A1C = 6.7 marks her as officially diabetic - diet controled. 6. Obesity stable.  With hypertension and DM and BMI=36, morbid obesity.   7. Due for recheck several labs    Review of Systems Feels healthy other than hemiparesis.  Has rolled left ankle a few times.  No change in bowel, bladder or appetitie.  No skin concerns.     Objective:   Physical Exam Lungs clear Cardiac 2/6 SEM Abd benign Ext no edema Dense left hemiparesis. Normal speech and articulation. Cognitive intact.        Assessment & Plan:

## 2015-06-17 NOTE — Assessment & Plan Note (Signed)
BMI 36 plus comorbid.  Continue to emphasize diet.

## 2015-06-17 NOTE — Assessment & Plan Note (Signed)
Now officially diabetic.  Will inform and will need additional dm related screening.

## 2015-06-22 ENCOUNTER — Other Ambulatory Visit: Payer: Self-pay | Admitting: Family Medicine

## 2015-06-29 ENCOUNTER — Other Ambulatory Visit: Payer: Self-pay | Admitting: Family Medicine

## 2015-07-08 ENCOUNTER — Other Ambulatory Visit: Payer: Self-pay | Admitting: *Deleted

## 2015-07-08 DIAGNOSIS — I10 Essential (primary) hypertension: Secondary | ICD-10-CM

## 2015-07-09 MED ORDER — POTASSIUM CHLORIDE ER 10 MEQ PO CPCR: 10.0000 meq | ORAL_CAPSULE | Freq: Two times a day (BID) | ORAL | Status: DC

## 2015-07-20 ENCOUNTER — Other Ambulatory Visit: Payer: Self-pay | Admitting: Family Medicine

## 2015-07-27 ENCOUNTER — Other Ambulatory Visit: Payer: Self-pay | Admitting: Family Medicine

## 2015-07-31 DIAGNOSIS — M5430 Sciatica, unspecified side: Secondary | ICD-10-CM | POA: Diagnosis not present

## 2015-08-05 DIAGNOSIS — M7062 Trochanteric bursitis, left hip: Secondary | ICD-10-CM | POA: Diagnosis not present

## 2015-08-05 DIAGNOSIS — M5416 Radiculopathy, lumbar region: Secondary | ICD-10-CM | POA: Diagnosis not present

## 2015-08-05 DIAGNOSIS — M47816 Spondylosis without myelopathy or radiculopathy, lumbar region: Secondary | ICD-10-CM | POA: Diagnosis not present

## 2015-08-09 ENCOUNTER — Telehealth: Payer: Self-pay | Admitting: Family Medicine

## 2015-08-09 DIAGNOSIS — I69354 Hemiplegia and hemiparesis following cerebral infarction affecting left non-dominant side: Secondary | ICD-10-CM

## 2015-08-09 NOTE — Telephone Encounter (Signed)
Pt informed.  T , CMA  

## 2015-08-09 NOTE — Assessment & Plan Note (Signed)
Agree with return to physical therapy.

## 2015-08-09 NOTE — Telephone Encounter (Signed)
Pt called because she would like to go back to PT. jw

## 2015-08-16 DIAGNOSIS — J209 Acute bronchitis, unspecified: Secondary | ICD-10-CM | POA: Diagnosis not present

## 2015-08-18 ENCOUNTER — Ambulatory Visit: Payer: Medicare Other | Admitting: Physical Therapy

## 2015-08-18 ENCOUNTER — Other Ambulatory Visit: Payer: Self-pay | Admitting: Physical Medicine and Rehabilitation

## 2015-08-18 DIAGNOSIS — M545 Low back pain: Secondary | ICD-10-CM

## 2015-08-18 DIAGNOSIS — J9811 Atelectasis: Secondary | ICD-10-CM | POA: Diagnosis not present

## 2015-08-18 DIAGNOSIS — J208 Acute bronchitis due to other specified organisms: Secondary | ICD-10-CM | POA: Diagnosis not present

## 2015-08-19 ENCOUNTER — Ambulatory Visit: Payer: Medicare Other | Admitting: Family Medicine

## 2015-08-31 ENCOUNTER — Ambulatory Visit: Payer: Medicare Other | Admitting: Physical Therapy

## 2015-09-01 ENCOUNTER — Other Ambulatory Visit: Payer: Self-pay | Admitting: Family Medicine

## 2015-09-02 ENCOUNTER — Ambulatory Visit
Admission: RE | Admit: 2015-09-02 | Discharge: 2015-09-02 | Disposition: A | Discharge status: Home / Self Care | Payer: Medicare Other | Source: Ambulatory Visit | Attending: Physical Medicine and Rehabilitation | Admitting: Physical Medicine and Rehabilitation

## 2015-09-02 DIAGNOSIS — M4806 Spinal stenosis, lumbar region: Secondary | ICD-10-CM | POA: Diagnosis not present

## 2015-09-02 DIAGNOSIS — M545 Low back pain: Secondary | ICD-10-CM

## 2015-09-06 ENCOUNTER — Ambulatory Visit: Payer: Medicare Other | Admitting: Physical Therapy

## 2015-09-09 DIAGNOSIS — M47816 Spondylosis without myelopathy or radiculopathy, lumbar region: Secondary | ICD-10-CM | POA: Diagnosis not present

## 2015-09-09 DIAGNOSIS — M25512 Pain in left shoulder: Secondary | ICD-10-CM | POA: Diagnosis not present

## 2015-09-09 DIAGNOSIS — M791 Myalgia: Secondary | ICD-10-CM | POA: Diagnosis not present

## 2015-09-15 ENCOUNTER — Encounter: Payer: Self-pay | Admitting: Physical Therapy

## 2015-09-15 ENCOUNTER — Ambulatory Visit: Payer: Medicare Other | Attending: Family Medicine | Admitting: Physical Therapy

## 2015-09-15 DIAGNOSIS — M25572 Pain in left ankle and joints of left foot: Secondary | ICD-10-CM | POA: Diagnosis not present

## 2015-09-15 DIAGNOSIS — R5381 Other malaise: Secondary | ICD-10-CM

## 2015-09-15 DIAGNOSIS — R262 Difficulty in walking, not elsewhere classified: Secondary | ICD-10-CM

## 2015-09-15 DIAGNOSIS — I6990 Unspecified sequelae of unspecified cerebrovascular disease: Secondary | ICD-10-CM | POA: Diagnosis not present

## 2015-09-15 DIAGNOSIS — R531 Weakness: Secondary | ICD-10-CM

## 2015-09-17 NOTE — Therapy (Signed)
[address] [place], [place], [address] Phone: [phone]   Fax:  [phone]  Physical Therapy Evaluation  Patient Details  Name: Karen Jackson MRN: [id number] Date of Birth: [birth year] Referring Provider: Andria Frames  Encounter Date: 09/15/2015    Past Medical History  Diagnosis Date  . Arthritis   . Hyperlipidemia   . Hypertension   . Osteopenia     Past Surgical History  Procedure Laterality Date  . Ankle surgery    . Joint replacement      total- right partial- left  . Appendectomy    . Cholecystectomy    . Hernia repair      Esophagus  . Mastectomy partial / lumpectomy  2012    left    There were no vitals filed for this visit.  Visit Diagnosis:  Late effects of CVA (cerebrovascular accident) - Plan: PT plan of care cert/re-cert  Difficulty walking - Plan: PT plan of care cert/re-cert  Debility - Plan: PT plan of care cert/re-cert  Weakness - Plan: PT plan of care cert/re-cert                                         Plan - 02-Oct-2015 0801    Clinical Impression Statement Patient with CVA a few years She has mostly recovered from this with a level of function prior that was her walking 300 feet with a Walker, she was able to drive about a year ago, and was walking with a quad cane for about 60 feet at times.  She has not been seen by PT in over 7 months, she reports a fall recently and then she became sick and reports that she has really regressed in function, reporting that she is having inability to walk at home without help.   Pt will benefit from skilled therapeutic intervention in order to improve on the following deficits Abnormal gait;Cardiopulmonary status limiting activity;Decreased activity tolerance;Decreased mobility;Decreased endurance;Decreased range of motion;Decreased strength;Difficulty walking;Impaired flexibility;Impaired sensation;Improper  body mechanics;Pain;Impaired UE functional use   Rehab Potential Fair   PT Frequency 2x / week   PT Duration 8 weeks   PT Treatment/Interventions ADLs/Self Care Home Management;Electrical Stimulation;Moist Heat;Therapeutic exercise;Therapeutic activities;Functional mobility training;Gait training;Balance training;Neuromuscular re-education;Patient/family education;Manual techniques;Passive range of motion   PT Next Visit Plan Work toward getting her moving again   Consulted and Agree with Plan of Care Patient          G-Codes - Oct 02, 2015 0756    Functional Assessment Tool Used foto 78% limitation   Functional Limitation Other PT primary   Other PT Primary Current Status [id number]) At least 60 percent but less than 80 percent impaired, limited or restricted   Other PT Primary Goal Status [id number]) At least 60 percent but less than 80 percent impaired, limited or restricted       Problem List Patient Active Problem List   Diagnosis Date Noted  . Morbid obesity ([place]) 06/17/2015  . Fall at home 12/01/2014  . Hypomagnesemia 04/24/2014  . Hemiparesis affecting left side as late effect of cerebrovascular accident ([place]) 04/24/2014  . Nontraumatic cerebral hemorrhage ([place]) 04/30/2012  . Diabetes type 2, controlled ([place]) 03/04/2010  . OSTEOPENIA 01/21/2009  . UNSPECIFIED VITAMIN D DEFICIENCY 11/19/2007  . ESSENTIAL HYPERTENSION, BENIGN 11/19/2007  . HYPERCHOLESTEROLEMIA 10/25/2006  . GASTROESOPHAGEAL REFLUX, NO ESOPHAGITIS 10/25/2006  . DIVERTICULOSIS OF  COLON 10/25/2006  . Osteoarthritis 10/25/2006  . CERVICAL SPINE DISORDER, NOS 10/25/2006    Sumner Boast., PT 09/17/2015, 8:14 AM  [facility] [address] [place], [place], [address] Phone: [phone]   Fax:  [phone]  Name: Karen Jackson MRN: [id number] Date of Birth: 1941-01-15

## 2015-09-20 ENCOUNTER — Ambulatory Visit: Payer: Medicare Other | Admitting: Physical Therapy

## 2015-09-20 ENCOUNTER — Encounter: Payer: Self-pay | Admitting: Physical Therapy

## 2015-09-20 DIAGNOSIS — I6990 Unspecified sequelae of unspecified cerebrovascular disease: Secondary | ICD-10-CM | POA: Diagnosis not present

## 2015-09-20 DIAGNOSIS — R5381 Other malaise: Secondary | ICD-10-CM | POA: Diagnosis not present

## 2015-09-20 DIAGNOSIS — R531 Weakness: Secondary | ICD-10-CM | POA: Diagnosis not present

## 2015-09-20 DIAGNOSIS — R262 Difficulty in walking, not elsewhere classified: Secondary | ICD-10-CM | POA: Diagnosis not present

## 2015-09-20 DIAGNOSIS — M25572 Pain in left ankle and joints of left foot: Secondary | ICD-10-CM | POA: Diagnosis not present

## 2015-09-20 NOTE — Therapy (Signed)
[address] [place], [place], [address] Phone: [phone]   Fax:  [phone]  Physical Therapy Treatment  Patient Details  Name: Karen Jackson MRN: [id number] Date of Birth: 06-24-1941 Referring Provider: Andria Frames  Encounter Date: 09/20/2015      PT End of Session - 09/20/15 1135    Visit Number 2   Date for PT Re-Evaluation 11/13/15   PT Start Time 1004   PT Stop Time 1057   PT Time Calculation (min) 53 min   Activity Tolerance Patient tolerated treatment well   Behavior During Therapy [facility] for tasks assessed/performed      Past Medical History  Diagnosis Date  . Arthritis   . Hyperlipidemia   . Hypertension   . Osteopenia     Past Surgical History  Procedure Laterality Date  . Ankle surgery    . Joint replacement      total- right partial- left  . Appendectomy    . Cholecystectomy    . Hernia repair      Esophagus  . Mastectomy partial / lumpectomy  2012    left    There were no vitals filed for this visit.  Visit Diagnosis:  Late effects of CVA (cerebrovascular accident)  Difficulty walking  Debility  Weakness      Subjective Assessment - 09/20/15 1006    Subjective I have been stiff and slow.  I felt like I moved a little better after the first visit.   Currently in Pain? Yes   Pain Score 4    Pain Location Shoulder   Pain Orientation Left   Pain Descriptors / Indicators Sore                         [facility] Adult PT Treatment/Exercise - 09/20/15 0001    Ambulation/Gait   Gait Comments gait with HHA x160 feet   Exercises   Exercises Knee/Hip   Knee/Hip Exercises: Machines for Strengthening   Cybex Leg Press 20# 2x6, left only no weight 2x10   Other Machine standing 3# hip extension, 4" toe clears, weight shift and TKE with green tband on the left   Manual Therapy   Manual therapy comments PROM of the left shoulder, approximation of the fingers with  motions to stimulate increased function   Ankle Exercises: Aerobic   Elliptical uStep Level 5 x 5 minutes   Ankle Exercises: Seated   Other Seated Ankle Exercises seated ankle on sit fit motions, seated yellow tband ankle exercises with a lot of cues and some help to perform   Other Seated Ankle Exercises tband DF and eversion                            Plan - 09/20/15 1141    Clinical Impression Statement Patient with great difficulty controlling the left knee and ankle, the knee wants to hyperextend, the ankle wants to roll.  She is very deconditioned and needs encouragement to push herself   PT Next Visit Plan Work toward getting her moving again   Consulted and Agree with Plan of Care Patient        Problem List Patient Active Problem List   Diagnosis Date Noted  . Morbid obesity ([place]) 06/17/2015  . Fall at home 12/01/2014  . Hypomagnesemia 04/24/2014  . Hemiparesis affecting left side as late effect of cerebrovascular accident ([place]) 04/24/2014  .  Nontraumatic cerebral hemorrhage ([place]) 04/30/2012  . Diabetes type 2, controlled ([place]) 03/04/2010  . OSTEOPENIA 01/21/2009  . UNSPECIFIED VITAMIN D DEFICIENCY 11/19/2007  . ESSENTIAL HYPERTENSION, BENIGN 11/19/2007  . HYPERCHOLESTEROLEMIA 10/25/2006  . GASTROESOPHAGEAL REFLUX, NO ESOPHAGITIS 10/25/2006  . DIVERTICULOSIS OF COLON 10/25/2006  . Osteoarthritis 10/25/2006  . CERVICAL SPINE DISORDER, NOS 10/25/2006    Sumner Boast., PT 09/20/2015, 11:43 AM  [address] [place], [place], [address] Phone: [phone]   Fax:  [phone]  Name: Karen Jackson MRN: [id number] Date of Birth: 1940/12/11

## 2015-09-22 ENCOUNTER — Other Ambulatory Visit: Payer: Self-pay | Admitting: Family Medicine

## 2015-09-23 ENCOUNTER — Encounter: Payer: Self-pay | Admitting: Physical Therapy

## 2015-09-23 ENCOUNTER — Ambulatory Visit: Payer: Medicare Other | Admitting: Physical Therapy

## 2015-09-23 DIAGNOSIS — R262 Difficulty in walking, not elsewhere classified: Secondary | ICD-10-CM

## 2015-09-23 DIAGNOSIS — R531 Weakness: Secondary | ICD-10-CM | POA: Diagnosis not present

## 2015-09-23 DIAGNOSIS — M25572 Pain in left ankle and joints of left foot: Secondary | ICD-10-CM | POA: Diagnosis not present

## 2015-09-23 DIAGNOSIS — I6930 Unspecified sequelae of cerebral infarction: Secondary | ICD-10-CM

## 2015-09-23 DIAGNOSIS — R5381 Other malaise: Secondary | ICD-10-CM

## 2015-09-23 DIAGNOSIS — I6990 Unspecified sequelae of unspecified cerebrovascular disease: Secondary | ICD-10-CM | POA: Diagnosis not present

## 2015-09-23 NOTE — Therapy (Signed)
[address] [place], [place], [address] Phone: [phone]   Fax:  [phone]  Physical Therapy Treatment  Patient Details  Name: Karen Jackson MRN: [id number] Date of Birth: [birth year] Referring Provider: Andria Frames  Encounter Date: 09/23/2015      PT End of Session - 09/23/15 1132    Visit Number 3   Date for PT Re-Evaluation 11/13/15   PT Start Time 1004   PT Stop Time 1100   PT Time Calculation (min) 56 min   Activity Tolerance Patient tolerated treatment well   Behavior During Therapy [facility] for tasks assessed/performed      Past Medical History  Diagnosis Date  . Arthritis   . Hyperlipidemia   . Hypertension   . Osteopenia     Past Surgical History  Procedure Laterality Date  . Ankle surgery    . Joint replacement      total- right partial- left  . Appendectomy    . Cholecystectomy    . Hernia repair      Esophagus  . Mastectomy partial / lumpectomy  2012    left    There were no vitals filed for this visit.  Visit Diagnosis:  Late effects of CVA (cerebrovascular accident)  Difficulty walking  Debility  Weakness  Left ankle pain      Subjective Assessment - 09/23/15 1057    Subjective (p) I was worn out after doing those exercises, I just really went to pot, I am pitiful, can't do much for myself anymore   Currently in Pain? (p) Yes   Pain Score (p) 3                          [facility] Adult PT Treatment/Exercise - 09/23/15 0001    Ambulation/Gait   Gait Comments gait with HHA x160 feet   High Level Balance   High Level Balance Comments HHA side stepping and backward walking   Knee/Hip Exercises: Machines for Strengthening   Cybex Leg Press 20# 2x10, left only no weight 2x10   Other Machine standing 3# hip extension, 4" toe clears, weight shift and TKE with green tband on the left   Manual Therapy   Manual therapy comments PROM of the left shoulder,  approximation of the fingers with motions to stimulate increased function   Ankle Exercises: Seated   Other Seated Ankle Exercises seated ankle on sit fit motions, seated yellow tband ankle exercises with a lot of cues and some help to perform   Other Seated Ankle Exercises tband DF and eversion                  PT Short Term Goals - 09/23/15 1137    PT SHORT TERM GOAL #1   Title independent with initial HEP   Status Achieved                  Plan - 09/23/15 1132    Clinical Impression Statement Patient again is so much more deconditioned than what she was a year ago.  She needs some breaks, needs cue to focus and to do motions.   PT Next Visit Plan Work toward getting her moving again   Consulted and Agree with Plan of Care Patient        Problem List Patient Active Problem List   Diagnosis Date Noted  . Morbid obesity ([place]) 06/17/2015  . Fall at home 12/01/2014  .  Hypomagnesemia 04/24/2014  . Hemiparesis affecting left side as late effect of cerebrovascular accident ([place]) 04/24/2014  . Nontraumatic cerebral hemorrhage ([place]) 04/30/2012  . Diabetes type 2, controlled ([place]) 03/04/2010  . OSTEOPENIA 01/21/2009  . UNSPECIFIED VITAMIN D DEFICIENCY 11/19/2007  . ESSENTIAL HYPERTENSION, BENIGN 11/19/2007  . HYPERCHOLESTEROLEMIA 10/25/2006  . GASTROESOPHAGEAL REFLUX, NO ESOPHAGITIS 10/25/2006  . DIVERTICULOSIS OF COLON 10/25/2006  . Osteoarthritis 10/25/2006  . CERVICAL SPINE DISORDER, NOS 10/25/2006    Sumner Boast., PT 09/23/2015, 11:38 AM  [address] [place], [place], [address] Phone: [phone]   Fax:  [phone]  Name: Karen Jackson MRN: [id number] Date of Birth: 1940/10/31

## 2015-09-27 ENCOUNTER — Encounter: Payer: Self-pay | Admitting: Physical Therapy

## 2015-09-27 ENCOUNTER — Ambulatory Visit: Payer: Medicare Other | Admitting: Physical Therapy

## 2015-09-27 DIAGNOSIS — R531 Weakness: Secondary | ICD-10-CM

## 2015-09-27 DIAGNOSIS — R262 Difficulty in walking, not elsewhere classified: Secondary | ICD-10-CM

## 2015-09-27 DIAGNOSIS — I6990 Unspecified sequelae of unspecified cerebrovascular disease: Secondary | ICD-10-CM | POA: Diagnosis not present

## 2015-09-27 DIAGNOSIS — R5381 Other malaise: Secondary | ICD-10-CM

## 2015-09-27 DIAGNOSIS — M25572 Pain in left ankle and joints of left foot: Secondary | ICD-10-CM | POA: Diagnosis not present

## 2015-09-27 NOTE — Therapy (Signed)
[address] [place], [place], [address] Phone: [phone]   Fax:  [phone]  Physical Therapy Treatment  Patient Details  Name: Karen Jackson MRN: [id number] Date of Birth: 04-17-1941 Referring Provider: Andria Frames  Encounter Date: 09/27/2015      PT End of Session - 09/27/15 1114    Visit Number 4   Date for PT Re-Evaluation 11/13/15   PT Start Time 1005   PT Stop Time 1102   PT Time Calculation (min) 57 min   Activity Tolerance Patient tolerated treatment well   Behavior During Therapy [facility] for tasks assessed/performed      Past Medical History  Diagnosis Date  . Arthritis   . Hyperlipidemia   . Hypertension   . Osteopenia     Past Surgical History  Procedure Laterality Date  . Ankle surgery    . Joint replacement      total- right partial- left  . Appendectomy    . Cholecystectomy    . Hernia repair      Esophagus  . Mastectomy partial / lumpectomy  2012    left    There were no vitals filed for this visit.  Visit Diagnosis:  Late effects of CVA (cerebrovascular accident)  Difficulty walking  Debility  Weakness      Subjective Assessment - 09/27/15 1108    Subjective My left ankle is a little sore.  No falls   Currently in Pain? Yes   Pain Score 3    Pain Location Shoulder   Pain Orientation Left            [facility] PT Assessment - 09/27/15 0001    Timed Up and Go Test   Normal TUG (seconds) 46                     [facility] Adult PT Treatment/Exercise - 09/27/15 0001    Ambulation/Gait   Gait Comments gait with HHA x160 feet   High Level Balance   High Level Balance Comments HHA side stepping and backward walking   Knee/Hip Exercises: Aerobic   Stationary Bike 4 minutes with some assist with the left foot and leg for propulsion   Tread Mill 1.0 MPH x 5 minutes, some cues needed for step length and to pick up the foot   Knee/Hip Exercises: Machines for  Strengthening   Cybex Leg Press 20# 2x10, left only no weight 2x10   Other Machine standing 3# hip extension, 6" toe clears, weight shift and TKE with green tband on the left, in sitting did the PWR moves Up, twist and Rock 20 reps eachj   Knee/Hip Exercises: Seated   Abduction/Adduction  20 reps   Abd/Adduction Limitations did isometrics   Manual Therapy   Manual therapy comments PROM of the left shoulder, approximation of the fingers with motions to stimulate increased function   Ankle Exercises: Seated   Other Seated Ankle Exercises seated ankle on sit fit motions, seated yellow tband ankle exercises with a lot of cues and some help to perform   Other Seated Ankle Exercises tband DF and eversion                  PT Short Term Goals - 09/23/15 1137    PT SHORT TERM GOAL #1   Title independent with initial HEP   Status Achieved           PT Long Term Goals - 09/27/15  [place] #1   Title decrease timed up and go test to 25 seconds   Status On-going   PT LONG TERM GOAL #2   Status On-going   PT LONG TERM GOAL #3   Status On-going   PT LONG TERM GOAL #5   Status On-going               Plan - 09/27/15 1114    Clinical Impression Statement Unable to keep speed up to keep bike on, needs a lot of encouragement to push to complete exercises for endurance   PT Next Visit Plan Continue to try to push her fitness and activity   Consulted and Agree with Plan of Care Patient        Problem List Patient Active Problem List   Diagnosis Date Noted  . Morbid obesity ([place]) 06/17/2015  . Fall at home 12/01/2014  . Hypomagnesemia 04/24/2014  . Hemiparesis affecting left side as late effect of cerebrovascular accident ([place]) 04/24/2014  . Nontraumatic cerebral hemorrhage ([place]) 04/30/2012  . Diabetes type 2, controlled ([place]) 03/04/2010  . OSTEOPENIA 01/21/2009  . UNSPECIFIED VITAMIN D DEFICIENCY 11/19/2007  . ESSENTIAL HYPERTENSION, BENIGN  11/19/2007  . HYPERCHOLESTEROLEMIA 10/25/2006  . GASTROESOPHAGEAL REFLUX, NO ESOPHAGITIS 10/25/2006  . DIVERTICULOSIS OF COLON 10/25/2006  . Osteoarthritis 10/25/2006  . CERVICAL SPINE DISORDER, NOS 10/25/2006    Sumner Boast., PT 09/27/2015, 11:16 AM  [address] [place], [place], [address] Phone: [phone]   Fax:  [phone]  Name: Karen Jackson MRN: [id number] Date of Birth: [birth year]

## 2015-09-29 ENCOUNTER — Other Ambulatory Visit: Payer: Self-pay | Admitting: Family Medicine

## 2015-09-29 DIAGNOSIS — Z961 Presence of intraocular lens: Secondary | ICD-10-CM | POA: Diagnosis not present

## 2015-09-29 DIAGNOSIS — H5213 Myopia, bilateral: Secondary | ICD-10-CM | POA: Diagnosis not present

## 2015-09-30 ENCOUNTER — Encounter: Payer: Self-pay | Admitting: Physical Therapy

## 2015-09-30 ENCOUNTER — Ambulatory Visit: Payer: Medicare Other | Attending: Family Medicine | Admitting: Physical Therapy

## 2015-09-30 DIAGNOSIS — R531 Weakness: Secondary | ICD-10-CM | POA: Diagnosis not present

## 2015-09-30 DIAGNOSIS — R5381 Other malaise: Secondary | ICD-10-CM | POA: Diagnosis not present

## 2015-09-30 DIAGNOSIS — R262 Difficulty in walking, not elsewhere classified: Secondary | ICD-10-CM | POA: Diagnosis not present

## 2015-09-30 DIAGNOSIS — I6990 Unspecified sequelae of unspecified cerebrovascular disease: Secondary | ICD-10-CM | POA: Insufficient documentation

## 2015-09-30 DIAGNOSIS — M7989 Other specified soft tissue disorders: Secondary | ICD-10-CM | POA: Diagnosis not present

## 2015-09-30 DIAGNOSIS — M25572 Pain in left ankle and joints of left foot: Secondary | ICD-10-CM | POA: Diagnosis not present

## 2015-09-30 NOTE — Therapy (Signed)
[address] [place], [place], [address] Phone: [phone]   Fax:  [phone]  Physical Therapy Treatment  Patient Details  Name: Karen Jackson MRN: [id number] Date of Birth: [birth year] Referring Provider: Andria Frames  Encounter Date: 09/30/2015      PT End of Session - 09/30/15 1135    Visit Number 5   Date for PT Re-Evaluation 11/13/15   PT Start Time 1050   PT Stop Time 1150   PT Time Calculation (min) 60 min   Activity Tolerance Patient tolerated treatment well   Behavior During Therapy [facility] for tasks assessed/performed      Past Medical History  Diagnosis Date  . Arthritis   . Hyperlipidemia   . Hypertension   . Osteopenia     Past Surgical History  Procedure Laterality Date  . Ankle surgery    . Joint replacement      total- right partial- left  . Appendectomy    . Cholecystectomy    . Hernia repair      Esophagus  . Mastectomy partial / lumpectomy  2012    left    There were no vitals filed for this visit.  Visit Diagnosis:  Late effects of CVA (cerebrovascular accident)  Difficulty walking  Debility  Left ankle pain  Weakness  Swelling of limb      Subjective Assessment - 09/30/15 1101    Subjective Reports that her left ankle has been hurting  a lot since the last visit. " I think we over did it"   Currently in Pain? Yes   Pain Score 5    Pain Location Ankle   Pain Orientation Left   Pain Descriptors / Indicators Sore   Pain Type Chronic pain   Aggravating Factors  over doing it   Pain Relieving Factors rest and ice                         [facility] Adult PT Treatment/Exercise - 09/30/15 0001    Ambulation/Gait   Gait Comments gait with HHA x160 feet   High Level Balance   High Level Balance Comments HHA side stepping and backward walking   Knee/Hip Exercises: Aerobic   Tread Mill 1.0 mph x 4 minutes   Other Aerobic UBE constant work 10 watts x 4  minutes   Knee/Hip Exercises: Field seismologist for Strengthening   Other Machine standing 3# hip extension, 6" toe clears, weight shift and TKE with green tband on the left, in sitting did the PWR moves Up, twist and Rock 20 reps eachj   Knee/Hip Exercises: Seated   Abduction/Adduction  20 reps   Abd/Adduction Limitations did isometrics   Knee/Hip Exercises: Sidelying   Other Sidelying Knee/Hip Exercises PWR moves in sitting, weighted ball obliques, and over head lifts   Modalities   Modalities Vasopneumatic   Vasopneumatic   Number Minutes Vasopneumatic  15 minutes   Vasopnuematic Location  Ankle   Vasopneumatic Pressure Medium   Vasopneumatic Temperature  35   Manual Therapy   Manual therapy comments PROM of the left shoulder, approximation of the fingers with motions to stimulate increased function   Ankle Exercises: Seated   Other Seated Ankle Exercises seated ankle on sit fit motions, seated yellow tband ankle exercises with a lot of cues and some help to perform   Other Seated Ankle Exercises tband DF and eversion  PT Short Term Goals - 09/23/15 1137    PT SHORT TERM GOAL #1   Title independent with initial HEP   Status Achieved           PT Long Term Goals - 09/30/15 1136    PT LONG TERM GOAL #2   Title increased right LE strength to 4-/5   Status On-going               Plan - 09/30/15 1135    Clinical Impression Statement Patient with increased pain in the left ankle after the last treatment, some swelling of the left ankle today, tender anterior ankle.  She is very debilitated.   PT Next Visit Plan Continue to try to push her fitness and activity   Consulted and Agree with Plan of Care Patient        Problem List Patient Active Problem List   Diagnosis Date Noted  . Morbid obesity ([place]) 06/17/2015  . Fall at home 12/01/2014  . Hypomagnesemia 04/24/2014  . Hemiparesis affecting left side as late effect of cerebrovascular accident  ([place]) 04/24/2014  . Nontraumatic cerebral hemorrhage ([place]) 04/30/2012  . Diabetes type 2, controlled ([place]) 03/04/2010  . OSTEOPENIA 01/21/2009  . UNSPECIFIED VITAMIN D DEFICIENCY 11/19/2007  . ESSENTIAL HYPERTENSION, BENIGN 11/19/2007  . HYPERCHOLESTEROLEMIA 10/25/2006  . GASTROESOPHAGEAL REFLUX, NO ESOPHAGITIS 10/25/2006  . DIVERTICULOSIS OF COLON 10/25/2006  . Osteoarthritis 10/25/2006  . CERVICAL SPINE DISORDER, NOS 10/25/2006    Sumner Boast., PT 09/30/2015, 11:38 AM  [address] [place], [place], [address] Phone: [phone]   Fax:  [phone]  Name: Karen Jackson MRN: [id number] Date of Birth: [birth year]

## 2015-10-04 ENCOUNTER — Encounter: Payer: Self-pay | Admitting: Physical Therapy

## 2015-10-04 ENCOUNTER — Ambulatory Visit: Payer: Medicare Other | Admitting: Physical Therapy

## 2015-10-04 DIAGNOSIS — I6990 Unspecified sequelae of unspecified cerebrovascular disease: Secondary | ICD-10-CM

## 2015-10-04 DIAGNOSIS — M7989 Other specified soft tissue disorders: Secondary | ICD-10-CM | POA: Diagnosis not present

## 2015-10-04 DIAGNOSIS — R5381 Other malaise: Secondary | ICD-10-CM

## 2015-10-04 DIAGNOSIS — R262 Difficulty in walking, not elsewhere classified: Secondary | ICD-10-CM | POA: Diagnosis not present

## 2015-10-04 DIAGNOSIS — R531 Weakness: Secondary | ICD-10-CM | POA: Diagnosis not present

## 2015-10-04 DIAGNOSIS — M25572 Pain in left ankle and joints of left foot: Secondary | ICD-10-CM | POA: Diagnosis not present

## 2015-10-04 NOTE — Therapy (Signed)
[address] [place], [place], [address] Phone: [phone]   Fax:  [phone]  Physical Therapy Treatment  Patient Details  Name: Karen Jackson MRN: [id number] Date of Birth: 16-Feb-1941 Referring Provider: Andria Frames  Encounter Date: 10/04/2015      PT End of Session - 10/04/15 1312    Visit Number 6   Date for PT Re-Evaluation 11/13/15   PT Start Time 1053   PT Stop Time 1150   PT Time Calculation (min) 57 min   Activity Tolerance Patient tolerated treatment well   Behavior During Therapy [facility] for tasks assessed/performed      Past Medical History  Diagnosis Date  . Arthritis   . Hyperlipidemia   . Hypertension   . Osteopenia     Past Surgical History  Procedure Laterality Date  . Ankle surgery    . Joint replacement      total- right partial- left  . Appendectomy    . Cholecystectomy    . Hernia repair      Esophagus  . Mastectomy partial / lumpectomy  2012    left    There were no vitals filed for this visit.  Visit Diagnosis:  Late effects of CVA (cerebrovascular accident)  Difficulty walking  Debility  Left ankle pain  Weakness      Subjective Assessment - 10/04/15 1124    Subjective My ankle felt better, has not bothered me since the last treatment   Currently in Pain? No/denies                         [facility] Adult PT Treatment/Exercise - 10/04/15 0001    Ambulation/Gait   Gait Comments (p) gait HHA 160 x 2 reps, some cues for step length and to pick up the left foot   High Level Balance   High Level Balance Comments (p) HHA side stepping and backward walking   Knee/Hip Exercises: Aerobic   Tread Mill (p) 1.0 mph x 5 minutes   Nustep (p) Level 4 x 6 minutes trying to keep steps per minute above 50   Knee/Hip Exercises: Machines for Strengthening   Cybex Leg Press (p) 20# 2x10, left only no weight 2x10   Knee/Hip Exercises: Standing   Other Standing  Knee Exercises (p) sit to stand without hands 2x10, 6" toe touches 2x10 each leg   Knee/Hip Exercises: Seated   Abduction/Adduction  (p) 20 reps   Abd/Adduction Limitations (p) did isometrics   Ankle Exercises: Seated   Other Seated Ankle Exercises (p) seated ankle on sit fit motions, seated yellow tband ankle exercises with a lot of cues and some help to perform   Other Seated Ankle Exercises (p) tband DF and eversion                  PT Short Term Goals - 09/23/15 1137    PT SHORT TERM GOAL #1   Title independent with initial HEP   Status Achieved           PT Long Term Goals - 09/30/15 1136    PT LONG TERM GOAL #2   Title increased right LE strength to 4-/5   Status On-going               Plan - 10/04/15 1312    Clinical Impression Statement Patient with good effort today, she has been tired and lethargic in the past, has left  neglect and spasticity of the left UE.   PT Next Visit Plan Continue to try to push her fitness and activity   Consulted and Agree with Plan of Care Patient        Problem List Patient Active Problem List   Diagnosis Date Noted  . Morbid obesity ([place]) 06/17/2015  . Fall at home 12/01/2014  . Hypomagnesemia 04/24/2014  . Hemiparesis affecting left side as late effect of cerebrovascular accident ([place]) 04/24/2014  . Nontraumatic cerebral hemorrhage ([place]) 04/30/2012  . Diabetes type 2, controlled ([place]) 03/04/2010  . OSTEOPENIA 01/21/2009  . UNSPECIFIED VITAMIN D DEFICIENCY 11/19/2007  . ESSENTIAL HYPERTENSION, BENIGN 11/19/2007  . HYPERCHOLESTEROLEMIA 10/25/2006  . GASTROESOPHAGEAL REFLUX, NO ESOPHAGITIS 10/25/2006  . DIVERTICULOSIS OF COLON 10/25/2006  . Osteoarthritis 10/25/2006  . CERVICAL SPINE DISORDER, NOS 10/25/2006    Sumner Boast., PT 10/04/2015, 1:14 PM  [address] [place], [place], [address] Phone: [phone]   Fax:   [phone]  Name: Karen Jackson MRN: [id number] Date of Birth: 01-26-1941

## 2015-10-07 ENCOUNTER — Encounter: Payer: Self-pay | Admitting: Physical Therapy

## 2015-10-07 ENCOUNTER — Ambulatory Visit: Payer: Medicare Other | Admitting: Physical Therapy

## 2015-10-07 DIAGNOSIS — I6990 Unspecified sequelae of unspecified cerebrovascular disease: Secondary | ICD-10-CM

## 2015-10-07 DIAGNOSIS — R262 Difficulty in walking, not elsewhere classified: Secondary | ICD-10-CM | POA: Diagnosis not present

## 2015-10-07 DIAGNOSIS — R531 Weakness: Secondary | ICD-10-CM

## 2015-10-07 DIAGNOSIS — R5381 Other malaise: Secondary | ICD-10-CM

## 2015-10-07 DIAGNOSIS — M7989 Other specified soft tissue disorders: Secondary | ICD-10-CM | POA: Diagnosis not present

## 2015-10-07 DIAGNOSIS — M25572 Pain in left ankle and joints of left foot: Secondary | ICD-10-CM | POA: Diagnosis not present

## 2015-10-07 NOTE — Therapy (Signed)
[place] [address] [place], [place], [address] Phone: [phone]   Fax:  [phone]  Physical Therapy Treatment  Patient Details  Name: ROSELINE BARQUIN MRN: [id number] Date of Birth: February 29, 1941 Referring Provider: Andria Frames  Encounter Date: 10/07/2015      PT End of Session - 10/07/15 1141    Visit Number 7   Date for PT Re-Evaluation 11/13/15   PT Start Time 1057   PT Stop Time 1147   PT Time Calculation (min) 50 min   Activity Tolerance Patient limited by fatigue   Behavior During Therapy [facility] for tasks assessed/performed      Past Medical History  Diagnosis Date  . Arthritis   . Hyperlipidemia   . Hypertension   . Osteopenia     Past Surgical History  Procedure Laterality Date  . Ankle surgery    . Joint replacement      total- right partial- left  . Appendectomy    . Cholecystectomy    . Hernia repair      Esophagus  . Mastectomy partial / lumpectomy  2012    left    There were no vitals filed for this visit.  Visit Diagnosis:  Late effects of CVA (cerebrovascular accident)  Difficulty walking  Debility  Weakness      Subjective Assessment - 10/07/15 1138    Subjective Still doing better, I walked more yesterday than I have in a long time, so I am tired and sore today   Currently in Pain? No/denies            [facility] PT Assessment - 10/07/15 0001    AROM   Left Shoulder Flexion 100 Degrees   Left Shoulder ABduction 90 Degrees   Left Ankle Dorsiflexion 8   Timed Up and Go Test   Normal TUG (seconds) 40                     [facility] Adult PT Treatment/Exercise - 10/07/15 0001    Knee/Hip Exercises: Aerobic   Tread Mill 1.0 mph x 5 minutes   Knee/Hip Exercises: Machines for Strengthening   Cybex Leg Press 40# x10 mostly eccentrics, 20# x10, left only no weight   Other Machine standing 3# hip extension, 6" toe clears, weight shift and TKE with green tband on the left,  in sitting did the PWR moves Up, twist and Rock 20 reps eachj   Knee/Hip Exercises: Standing   Forward Step Up 10 reps   Forward Step Up Limitations use of walker and some assist, and cues, 4" step   Ankle Exercises: Seated   Other Seated Ankle Exercises tband DF and eversion                  PT Short Term Goals - 09/23/15 1137    PT SHORT TERM GOAL #1   Title independent with initial HEP   Status Achieved           PT Long Term Goals - 10/07/15 1150    PT LONG TERM GOAL #1   Title decrease timed up and go test to 25 seconds   Status On-going   PT LONG TERM GOAL #2   Title increased right LE strength to 4-/5   Status On-going               Plan - 10/07/15 1149    Clinical Impression Statement TUG time is decreasing, ROM is improving.  She fatigues quickly and has left side neglect that is requiring verbal and tactile cues to get her to work the left LE during exercises   PT Next Visit Plan Continue to try to push her fitness and activity   Consulted and Agree with Plan of Care Patient        Problem List Patient Active Problem List   Diagnosis Date Noted  . Morbid obesity ([place]) 06/17/2015  . Fall at home 12/01/2014  . Hypomagnesemia 04/24/2014  . Hemiparesis affecting left side as late effect of cerebrovascular accident ([place]) 04/24/2014  . Nontraumatic cerebral hemorrhage ([place]) 04/30/2012  . Diabetes type 2, controlled ([place]) 03/04/2010  . OSTEOPENIA 01/21/2009  . UNSPECIFIED VITAMIN D DEFICIENCY 11/19/2007  . ESSENTIAL HYPERTENSION, BENIGN 11/19/2007  . HYPERCHOLESTEROLEMIA 10/25/2006  . GASTROESOPHAGEAL REFLUX, NO ESOPHAGITIS 10/25/2006  . DIVERTICULOSIS OF COLON 10/25/2006  . Osteoarthritis 10/25/2006  . CERVICAL SPINE DISORDER, NOS 10/25/2006    Sumner Boast., PT 10/07/2015, 11:52 AM  [place] [address] [place], [place], [address] Phone: [phone]   Fax:   [phone]  Name: SHIANNA SORICE MRN: [id number] Date of Birth: 23-May-1941

## 2015-10-11 ENCOUNTER — Ambulatory Visit: Payer: Medicare Other | Admitting: Physical Therapy

## 2015-10-11 ENCOUNTER — Encounter: Payer: Self-pay | Admitting: Physical Therapy

## 2015-10-11 DIAGNOSIS — M25572 Pain in left ankle and joints of left foot: Secondary | ICD-10-CM | POA: Diagnosis not present

## 2015-10-11 DIAGNOSIS — R531 Weakness: Secondary | ICD-10-CM | POA: Diagnosis not present

## 2015-10-11 DIAGNOSIS — R262 Difficulty in walking, not elsewhere classified: Secondary | ICD-10-CM

## 2015-10-11 DIAGNOSIS — R5381 Other malaise: Secondary | ICD-10-CM | POA: Diagnosis not present

## 2015-10-11 DIAGNOSIS — I6990 Unspecified sequelae of unspecified cerebrovascular disease: Secondary | ICD-10-CM | POA: Diagnosis not present

## 2015-10-11 DIAGNOSIS — M7989 Other specified soft tissue disorders: Secondary | ICD-10-CM | POA: Diagnosis not present

## 2015-10-11 NOTE — Therapy (Signed)
[place] [place], [place], [address] Phone: [phone]   Fax:  [phone]  Physical Therapy Treatment  Patient Details  Name: Karen Jackson MRN: [id number] Date of Birth: 1941/08/22 Referring Provider: Andria Frames  Encounter Date: 10/11/2015      PT End of Session - 10/11/15 1137    Visit Number 8   Date for PT Re-Evaluation 11/13/15   PT Start Time 1053   PT Stop Time 1150   PT Time Calculation (min) 57 min   Activity Tolerance Patient limited by fatigue   Behavior During Therapy [facility] for tasks assessed/performed      Past Medical History  Diagnosis Date  . Arthritis   . Hyperlipidemia   . Hypertension   . Osteopenia     Past Surgical History  Procedure Laterality Date  . Ankle surgery    . Joint replacement      total- right partial- left  . Appendectomy    . Cholecystectomy    . Hernia repair      Esophagus  . Mastectomy partial / lumpectomy  2012    left    There were no vitals filed for this visit.  Visit Diagnosis:  Late effects of CVA (cerebrovascular accident)  Difficulty walking  Debility  Weakness      Subjective Assessment - 10/11/15 1134    Subjective I have been a little sore in the left ankle and knee since the last visit.   Currently in Pain? Yes   Pain Score 4    Pain Location Knee   Pain Orientation Left                         [facility] Adult PT Treatment/Exercise - 10/11/15 0001    Ambulation/Gait   Gait Comments gait HHA 160 x 2 reps, some cues for step length and to pick up the left foot, gait with a SBQC 20 feet with a lot of verbal cues   High Level Balance   High Level Balance Comments HHA side stepping and backward walking, high knee marching, right foot on 6" step practicing balance on the left leg and trust of the left leg, 6" toe touches   Knee/Hip Exercises: Aerobic   Tread Mill 1.0 mph x 5 minutes   Knee/Hip Exercises: Standing    Other Standing Knee Exercises sit to stand without hands 2x10, 6" toe touches 2x10 each leg   Knee/Hip Exercises: Sidelying   Other Sidelying Knee/Hip Exercises PWR moves in sitting, weighted ball obliques, and over head lifts   Manual Therapy   Manual therapy comments PROM of the left shoulder, approximation of the fingers with motions to stimulate increased function   Ankle Exercises: Seated   Other Seated Ankle Exercises tband DF and eversion                  PT Short Term Goals - 09/23/15 1137    PT SHORT TERM GOAL #1   Title independent with initial HEP   Status Achieved           PT Long Term Goals - 10/07/15 1150    PT LONG TERM GOAL #1   Title decrease timed up and go test to 25 seconds   Status On-going   PT LONG TERM GOAL #2   Title increased right LE strength to 4-/5   Status On-going  Plan - 10/11/15 1138    Clinical Impression Statement Patient with much more difficulty today withe the left leg, she was listing away from the left side and did not trust the leg to hold her with stepping.   PT Next Visit Plan Continue to try to push her fitness and activity   Consulted and Agree with Plan of Care Patient        Problem List Patient Active Problem List   Diagnosis Date Noted  . Morbid obesity ([place]) 06/17/2015  . Fall at home 12/01/2014  . Hypomagnesemia 04/24/2014  . Hemiparesis affecting left side as late effect of cerebrovascular accident ([place]) 04/24/2014  . Nontraumatic cerebral hemorrhage ([place]) 04/30/2012  . Diabetes type 2, controlled ([place]) 03/04/2010  . OSTEOPENIA 01/21/2009  . UNSPECIFIED VITAMIN D DEFICIENCY 11/19/2007  . ESSENTIAL HYPERTENSION, BENIGN 11/19/2007  . HYPERCHOLESTEROLEMIA 10/25/2006  . GASTROESOPHAGEAL REFLUX, NO ESOPHAGITIS 10/25/2006  . DIVERTICULOSIS OF COLON 10/25/2006  . Osteoarthritis 10/25/2006  . CERVICAL SPINE DISORDER, NOS 10/25/2006    Sumner Boast., PT 10/11/2015, 11:39  AM  [place] [address] [place], [place], [address] Phone: [phone]   Fax:  [phone]  Name: Karen Jackson MRN: [id number] Date of Birth: August 19, 1941

## 2015-10-14 ENCOUNTER — Ambulatory Visit: Payer: Medicare Other | Admitting: Physical Therapy

## 2015-10-14 ENCOUNTER — Encounter: Payer: Self-pay | Admitting: Physical Therapy

## 2015-10-14 DIAGNOSIS — I6990 Unspecified sequelae of unspecified cerebrovascular disease: Secondary | ICD-10-CM

## 2015-10-14 DIAGNOSIS — R5381 Other malaise: Secondary | ICD-10-CM | POA: Diagnosis not present

## 2015-10-14 DIAGNOSIS — R531 Weakness: Secondary | ICD-10-CM

## 2015-10-14 DIAGNOSIS — R262 Difficulty in walking, not elsewhere classified: Secondary | ICD-10-CM | POA: Diagnosis not present

## 2015-10-14 DIAGNOSIS — M7989 Other specified soft tissue disorders: Secondary | ICD-10-CM | POA: Diagnosis not present

## 2015-10-14 DIAGNOSIS — M25572 Pain in left ankle and joints of left foot: Secondary | ICD-10-CM | POA: Diagnosis not present

## 2015-10-14 NOTE — Therapy (Signed)
[place] [address] [place], [place], [address] Phone: [phone]   Fax:  [phone]  Physical Therapy Treatment  Patient Details  Name: Karen Jackson MRN: [id number] Date of Birth: February 04, 1941 Referring Provider: Andria Frames  Encounter Date: 10/14/2015      PT End of Session - 10/14/15 1144    Visit Number 9   Date for PT Re-Evaluation 11/13/15   PT Start Time 1054   PT Stop Time 1150   PT Time Calculation (min) 56 min   Activity Tolerance Patient limited by fatigue   Behavior During Therapy [facility] for tasks assessed/performed      Past Medical History  Diagnosis Date  . Arthritis   . Hyperlipidemia   . Hypertension   . Osteopenia     Past Surgical History  Procedure Laterality Date  . Ankle surgery    . Joint replacement      total- right partial- left  . Appendectomy    . Cholecystectomy    . Hernia repair      Esophagus  . Mastectomy partial / lumpectomy  2012    left    There were no vitals filed for this visit.  Visit Diagnosis:  Late effects of CVA (cerebrovascular accident)  Difficulty walking  Debility  Weakness      Subjective Assessment - 10/14/15 1124    Subjective I am feeling better today.  I was not able to use my left leg very good on Monday   Currently in Pain? Yes   Pain Score 2    Pain Location Knee   Pain Orientation Left   Pain Descriptors / Indicators Sore   Aggravating Factors  unsure   Pain Relieving Factors ice                         [facility] Adult PT Treatment/Exercise - 10/14/15 0001    Ambulation/Gait   Gait Comments gait HHA 160 x 2 reps, some cues for step length and to pick up the left foot, gait with a SBQC 20 feet with a lot of verbal cues, gait with one hand assist with cues for posture, did better with this compared to the cane, seems to get rigid and more fearful with the cane   High Level Balance   High Level Balance Activities  Backward walking;Side stepping;Direction changes   Knee/Hip Exercises: Aerobic   Tread Mill 1.0 mph x 5 minutes   Knee/Hip Exercises: Machines for Strengthening   Other Machine standing 3# hip abduction extension and HS curls, 6" toe clears, weight shift and TKE with green tband on the left, in sitting did the PWR moves Up, twist and Rock 20 reps eachj   Knee/Hip Exercises: Standing   Forward Step Up 10 reps   Forward Step Up Limitations use of walker and some assist, and cues, 4" step   Other Standing Knee Exercises sit to stand x 10 reps no hands, cue to stand straight   Manual Therapy   Manual therapy comments PROM of the left shoulder, approximation of the fingers with motions to stimulate increased function   Ankle Exercises: Seated   Other Seated Ankle Exercises tband DF and eversion                  PT Short Term Goals - 09/23/15 1137    PT SHORT TERM GOAL #1   Title independent with initial HEP   Status Achieved  PT Long Term Goals - 10/14/15 1146    PT LONG TERM GOAL #2   Title increased right LE strength to 4-/5   Status On-going   PT LONG TERM GOAL #4   Title no falls   Status Achieved               Plan - 10/14/15 1144    Clinical Impression Statement Patient better today than on Monday, still very difficult for her to stand up without hands and then to assume a good posture, wants to flex knees, flex trunk forward and look down, as when she stand up straight she tends to fall backwards.   PT Next Visit Plan Continue to try to push her fitness and activity   Consulted and Agree with Plan of Care Patient        Problem List Patient Active Problem List   Diagnosis Date Noted  . Morbid obesity ([place]) 06/17/2015  . Fall at home 12/01/2014  . Hypomagnesemia 04/24/2014  . Hemiparesis affecting left side as late effect of cerebrovascular accident ([place]) 04/24/2014  . Nontraumatic cerebral hemorrhage ([place]) 04/30/2012  . Diabetes type 2,  controlled ([place]) 03/04/2010  . OSTEOPENIA 01/21/2009  . UNSPECIFIED VITAMIN D DEFICIENCY 11/19/2007  . ESSENTIAL HYPERTENSION, BENIGN 11/19/2007  . HYPERCHOLESTEROLEMIA 10/25/2006  . GASTROESOPHAGEAL REFLUX, NO ESOPHAGITIS 10/25/2006  . DIVERTICULOSIS OF COLON 10/25/2006  . Osteoarthritis 10/25/2006  . CERVICAL SPINE DISORDER, NOS 10/25/2006    Sumner Boast., PT 10/14/2015, 11:56 AM  [place] [address] [place], [place], [address] Phone: [phone]   Fax:  [phone]  Name: Karen Jackson MRN: [id number] Date of Birth: 29-Jun-1941

## 2015-10-18 ENCOUNTER — Encounter: Payer: Self-pay | Admitting: Physical Therapy

## 2015-10-18 ENCOUNTER — Ambulatory Visit: Payer: Medicare Other | Admitting: Physical Therapy

## 2015-10-18 DIAGNOSIS — R531 Weakness: Secondary | ICD-10-CM

## 2015-10-18 DIAGNOSIS — R5381 Other malaise: Secondary | ICD-10-CM

## 2015-10-18 DIAGNOSIS — I6990 Unspecified sequelae of unspecified cerebrovascular disease: Secondary | ICD-10-CM

## 2015-10-18 DIAGNOSIS — M25572 Pain in left ankle and joints of left foot: Secondary | ICD-10-CM | POA: Diagnosis not present

## 2015-10-18 DIAGNOSIS — R262 Difficulty in walking, not elsewhere classified: Secondary | ICD-10-CM | POA: Diagnosis not present

## 2015-10-18 DIAGNOSIS — M7989 Other specified soft tissue disorders: Secondary | ICD-10-CM | POA: Diagnosis not present

## 2015-10-18 NOTE — Therapy (Signed)
[place] [address] [place], [place], [address] Phone: [phone]   Fax:  [phone]  Physical Therapy Treatment  Patient Details  Name: Karen Jackson MRN: [id number] Date of Birth: Mar 16, 1941 Referring Provider: Andria Frames  Encounter Date: 10/18/2015      PT End of Session - 10/18/15 1109    Visit Number 10   Date for PT Re-Evaluation 11/13/15   PT Start Time 1007   PT Stop Time 1100   PT Time Calculation (min) 53 min   Activity Tolerance Patient limited by fatigue   Behavior During Therapy [facility] for tasks assessed/performed      Past Medical History  Diagnosis Date  . Arthritis   . Hyperlipidemia   . Hypertension   . Osteopenia     Past Surgical History  Procedure Laterality Date  . Ankle surgery    . Joint replacement      total- right partial- left  . Appendectomy    . Cholecystectomy    . Hernia repair      Esophagus  . Mastectomy partial / lumpectomy  2012    left    There were no vitals filed for this visit.  Visit Diagnosis:  Late effects of CVA (cerebrovascular accident)  Difficulty walking  Debility  Weakness      Subjective Assessment - 10/18/15 1055    Subjective Patient reports that on Friday her left ankle turned on her while she was pulling up her pants and she slid down to the floof.  I have seen that when she is doing other things she will tend to neglect the left side more.   Currently in Pain? Yes   Pain Score 2    Pain Location Ankle   Pain Orientation Left   Pain Descriptors / Indicators Sore            [facility] PT Assessment - 10/18/15 0001    Timed Up and Go Test   Normal TUG (seconds) 42                     [facility] Adult PT Treatment/Exercise - 10/18/15 0001    Ambulation/Gait   Gait Comments gait HHA 160 x 2 reps, some cues for step length and to pick up the left foot, gait with a SBQC 20 feet with a lot of verbal cues, gait with one hand assist  with cues for posture, did better with this compared to the cane, seems to get rigid and more fearful with the cane   High Level Balance   High Level Balance Activities Backward walking;Side stepping;Direction changes   High Level Balance Comments weight shifts, marches, 4" toe clears   Knee/Hip Exercises: Aerobic   Tread Mill 1.0 mph x 5 minutes   Other Aerobic UBE Level 4 x 4 minutes   Knee/Hip Exercises: Sidelying   Other Sidelying Knee/Hip Exercises PWR moves in sitting, weighted ball obliques, and over head lifts   Ankle Exercises: Seated   Other Seated Ankle Exercises seated ankle on sit fit motions, seated yellow tband ankle exercises with a lot of cues and some help to perform   Other Seated Ankle Exercises tband DF and eversion                  PT Short Term Goals - 09/23/15 1137    PT SHORT TERM GOAL #1   Title independent with initial HEP   Status Achieved  PT Long Term Goals - 04-Nov-2015 1115    PT LONG TERM GOAL #1   Title decrease timed up and go test to 25 seconds   Status On-going               Plan - 11-04-2015 1112    Clinical Impression Statement Patient with a fall over the weekend.  She continues to neglect the left side especially with any activity that she is doing she will let ankle roll or she will not extend the left knee fully.     PT Next Visit Plan may try to do activities that require her to talk while walking or reach while standing to see if we can get the left leg to stay engaged better   Consulted and Agree with Plan of Care Patient          G-Codes - 11/04/2015 1116    Functional Assessment Tool Used foto 58% limitation   Functional Limitation Other PT primary   Other PT Primary Current Status [id number]) At least 40 percent but less than 60 percent impaired, limited or restricted   Other PT Primary Goal Status [id number]) At least 60 percent but less than 80 percent impaired, limited or restricted      Problem  List Patient Active Problem List   Diagnosis Date Noted  . Morbid obesity ([place]) 06/17/2015  . Fall at home 12/01/2014  . Hypomagnesemia 04/24/2014  . Hemiparesis affecting left side as late effect of cerebrovascular accident ([place]) 04/24/2014  . Nontraumatic cerebral hemorrhage ([place]) 04/30/2012  . Diabetes type 2, controlled ([place]) 03/04/2010  . OSTEOPENIA 01/21/2009  . UNSPECIFIED VITAMIN D DEFICIENCY 11/19/2007  . ESSENTIAL HYPERTENSION, BENIGN 11/19/2007  . HYPERCHOLESTEROLEMIA 10/25/2006  . GASTROESOPHAGEAL REFLUX, NO ESOPHAGITIS 10/25/2006  . DIVERTICULOSIS OF COLON 10/25/2006  . Osteoarthritis 10/25/2006  . CERVICAL SPINE DISORDER, NOS 10/25/2006    Sumner Boast., PT 11/04/15, 11:18 AM  [address] [place], [place], [address] Phone: [phone]   Fax:  [phone]  Name: RENOTTA TROMPETER MRN: [id number] Date of Birth: 1940-09-01

## 2015-10-21 ENCOUNTER — Encounter: Payer: Self-pay | Admitting: Physical Therapy

## 2015-10-21 ENCOUNTER — Ambulatory Visit: Payer: Medicare Other | Admitting: Physical Therapy

## 2015-10-21 DIAGNOSIS — M7989 Other specified soft tissue disorders: Secondary | ICD-10-CM | POA: Diagnosis not present

## 2015-10-21 DIAGNOSIS — R531 Weakness: Secondary | ICD-10-CM

## 2015-10-21 DIAGNOSIS — R5381 Other malaise: Secondary | ICD-10-CM

## 2015-10-21 DIAGNOSIS — M25572 Pain in left ankle and joints of left foot: Secondary | ICD-10-CM | POA: Diagnosis not present

## 2015-10-21 DIAGNOSIS — R262 Difficulty in walking, not elsewhere classified: Secondary | ICD-10-CM | POA: Diagnosis not present

## 2015-10-21 DIAGNOSIS — I6990 Unspecified sequelae of unspecified cerebrovascular disease: Secondary | ICD-10-CM | POA: Diagnosis not present

## 2015-10-21 NOTE — Therapy (Signed)
[address] [place], [place], [address] Phone: [phone]   Fax:  [phone]  Physical Therapy Treatment  Patient Details  Name: Karen Jackson MRN: [id number] Date of Birth: 1940-10-04 Referring Provider: Andria Frames  Encounter Date: 10/21/2015      PT End of Session - 10/21/15 1133    Visit Number 11   Date for PT Re-Evaluation 11/13/15   PT Start Time 1057   Activity Tolerance Patient tolerated treatment well   Behavior During Therapy [facility] for tasks assessed/performed      Past Medical History  Diagnosis Date  . Arthritis   . Hyperlipidemia   . Hypertension   . Osteopenia     Past Surgical History  Procedure Laterality Date  . Ankle surgery    . Joint replacement      total- right partial- left  . Appendectomy    . Cholecystectomy    . Hernia repair      Esophagus  . Mastectomy partial / lumpectomy  2012    left    There were no vitals filed for this visit.  Visit Diagnosis:  Late effects of CVA (cerebrovascular accident)  Difficulty walking  Debility  Weakness      Subjective Assessment - 10/21/15 1121    Subjective I do not know why I do poorly on Monday and then do better as the week goes.   Currently in Pain? No/denies            [facility] PT Assessment - 10/21/15 0001    Timed Up and Go Test   Normal TUG (seconds) 29                     [facility] Adult PT Treatment/Exercise - 10/21/15 0001    Ambulation/Gait   Gait Comments gait HHA 160 x 2 reps, some cues for step length and to pick up the left foot, gait with a SBQC 20 feet with a lot of verbal cues, gait with one hand assist with cues for posture, did better with this compared to the cane, seems to get rigid and more fearful with the cane   High Level Balance   High Level Balance Activities Backward walking;Side stepping;Direction changes   Knee/Hip Exercises: Aerobic   Tread Mill 1.0 mph x 5 minutes   Nustep Level 4 x 5 minutes trying to keep step per minute above 65   Knee/Hip Exercises: Machines for Strengthening   Cybex Leg Press 20# 2x15, then left only no weight 2x10   Other Machine standing 5# hip abduction then 8", 6" and 4" toe touches while stnading using the walker                  PT Short Term Goals - 09/23/15 1137    PT SHORT TERM GOAL #1   Title independent with initial HEP   Status Achieved           PT Long Term Goals - 10/21/15 1136    PT LONG TERM GOAL #1   Title decrease timed up and go test to 25 seconds   Status Partially Met               Plan - 10/21/15 1133    Clinical Impression Statement Patient much better today iwth her ability to walk and have less neglect.  Very difficult with her to clear the toe with the 5# on the 8" step.   PT  Next Visit Plan continue to push her to test herself with [place].   Consulted and Agree with Plan of Care Patient        Problem List Patient Active Problem List   Diagnosis Date Noted  . Morbid obesity ([place]) 06/17/2015  . Fall at home 12/01/2014  . Hypomagnesemia 04/24/2014  . Hemiparesis affecting left side as late effect of cerebrovascular accident ([place]) 04/24/2014  . Nontraumatic cerebral hemorrhage ([place]) 04/30/2012  . Diabetes type 2, controlled ([place]) 03/04/2010  . OSTEOPENIA 01/21/2009  . UNSPECIFIED VITAMIN D DEFICIENCY 11/19/2007  . ESSENTIAL HYPERTENSION, BENIGN 11/19/2007  . HYPERCHOLESTEROLEMIA 10/25/2006  . GASTROESOPHAGEAL REFLUX, NO ESOPHAGITIS 10/25/2006  . DIVERTICULOSIS OF COLON 10/25/2006  . Osteoarthritis 10/25/2006  . CERVICAL SPINE DISORDER, NOS 10/25/2006    Sumner Boast., PT 10/21/2015, 11:48 AM  [place] [address] [place], [place], [address] Phone: [phone]   Fax:  [phone]  Name: NASIYA PASCUAL MRN: [id number] Date of Birth: 1941/08/13

## 2015-10-25 ENCOUNTER — Encounter: Payer: Self-pay | Admitting: Physical Therapy

## 2015-10-25 ENCOUNTER — Ambulatory Visit: Payer: Medicare Other | Admitting: Physical Therapy

## 2015-10-25 DIAGNOSIS — R5381 Other malaise: Secondary | ICD-10-CM | POA: Diagnosis not present

## 2015-10-25 DIAGNOSIS — M7989 Other specified soft tissue disorders: Secondary | ICD-10-CM | POA: Diagnosis not present

## 2015-10-25 DIAGNOSIS — I6990 Unspecified sequelae of unspecified cerebrovascular disease: Secondary | ICD-10-CM | POA: Diagnosis not present

## 2015-10-25 DIAGNOSIS — R262 Difficulty in walking, not elsewhere classified: Secondary | ICD-10-CM | POA: Diagnosis not present

## 2015-10-25 DIAGNOSIS — I6930 Unspecified sequelae of cerebral infarction: Secondary | ICD-10-CM

## 2015-10-25 DIAGNOSIS — M25572 Pain in left ankle and joints of left foot: Secondary | ICD-10-CM | POA: Diagnosis not present

## 2015-10-25 DIAGNOSIS — R531 Weakness: Secondary | ICD-10-CM | POA: Diagnosis not present

## 2015-10-25 NOTE — Therapy (Signed)
[place] [address] [place], [place], [address] Phone: [phone]   Fax:  [phone]  Physical Therapy Treatment  Patient Details  Name: Karen Jackson MRN: [id number] Date of Birth: 1941/04/03 Referring Provider: Andria Frames  Encounter Date: 10/25/2015      PT End of Session - 10/25/15 1152    Visit Number 12   Date for PT Re-Evaluation 11/13/15   PT Start Time 1010   PT Stop Time 1101   PT Time Calculation (min) 51 min   Activity Tolerance Patient tolerated treatment well   Behavior During Therapy [facility] for tasks assessed/performed      Past Medical History  Diagnosis Date  . Arthritis   . Hyperlipidemia   . Hypertension   . Osteopenia     Past Surgical History  Procedure Laterality Date  . Ankle surgery    . Joint replacement      total- right partial- left  . Appendectomy    . Cholecystectomy    . Hernia repair      Esophagus  . Mastectomy partial / lumpectomy  2012    left    There were no vitals filed for this visit.  Visit Diagnosis:  Late effects of CVA (cerebrovascular accident)  Difficulty walking  Debility  Weakness      Subjective Assessment - 10/25/15 1149    Subjective My ankle rolled a little bit yesterday but it was because I got out the car on a slope   Currently in Pain? No/denies                         [facility] Adult PT Treatment/Exercise - 10/25/15 0001    Ambulation/Gait   Gait Comments Gait 220 feet with HHA, cues for posture and to pick up left foot as well as step length   High Level Balance   High Level Balance Activities Backward walking;Side stepping;Direction changes   High Level Balance Comments sit to stand with ball reach, standing weight shift to get weight on the left leg, standing trunk rotation   Knee/Hip Exercises: Aerobic   Tread Mill 1.0 mph x 5 minutes   Nustep Level 4 x 5 minutes trying to keep step per minute above 65   Knee/Hip  Exercises: Sidelying   Other Sidelying Knee/Hip Exercises PWR moves in sitting, weighted ball obliques, and over head lifts   Manual Therapy   Manual therapy comments PROM of the left shoulder, approximation of the fingers with motions to stimulate increased function                  PT Short Term Goals - 09/23/15 1137    PT SHORT TERM GOAL #1   Title independent with initial HEP   Status Achieved           PT Long Term Goals - 10/21/15 1136    PT LONG TERM GOAL #1   Title decrease timed up and go test to 25 seconds   Status Partially Met               Plan - 10/25/15 1152    Clinical Impression Statement continues to have neglect of the left side.  She has difficulty with getting to standing, wants to stay leaned forward.  The left ankle rolls if she is on an incline or on a soft surface.   PT Next Visit Plan continue to push her to test herself  with [place].   Consulted and Agree with Plan of Care Patient        Problem List Patient Active Problem List   Diagnosis Date Noted  . Morbid obesity ([place]) 06/17/2015  . Fall at home 12/01/2014  . Hypomagnesemia 04/24/2014  . Hemiparesis affecting left side as late effect of cerebrovascular accident ([place]) 04/24/2014  . Nontraumatic cerebral hemorrhage ([place]) 04/30/2012  . Diabetes type 2, controlled ([place]) 03/04/2010  . OSTEOPENIA 01/21/2009  . UNSPECIFIED VITAMIN D DEFICIENCY 11/19/2007  . ESSENTIAL HYPERTENSION, BENIGN 11/19/2007  . HYPERCHOLESTEROLEMIA 10/25/2006  . GASTROESOPHAGEAL REFLUX, NO ESOPHAGITIS 10/25/2006  . DIVERTICULOSIS OF COLON 10/25/2006  . Osteoarthritis 10/25/2006  . CERVICAL SPINE DISORDER, NOS 10/25/2006    Sumner Boast., PT 10/25/2015, 11:59 AM  [place] [address] [place], [place], [address] Phone: [phone]   Fax:  [phone]  Name: GERYL DOHN MRN: [id number] Date of Birth: 04-13-1941

## 2015-10-29 ENCOUNTER — Encounter: Payer: Self-pay | Admitting: Physical Therapy

## 2015-10-29 ENCOUNTER — Ambulatory Visit: Payer: Medicare Other | Attending: Family Medicine | Admitting: Physical Therapy

## 2015-10-29 DIAGNOSIS — I6990 Unspecified sequelae of unspecified cerebrovascular disease: Secondary | ICD-10-CM | POA: Insufficient documentation

## 2015-10-29 DIAGNOSIS — R5381 Other malaise: Secondary | ICD-10-CM | POA: Diagnosis not present

## 2015-10-29 DIAGNOSIS — R531 Weakness: Secondary | ICD-10-CM | POA: Insufficient documentation

## 2015-10-29 DIAGNOSIS — R262 Difficulty in walking, not elsewhere classified: Secondary | ICD-10-CM | POA: Insufficient documentation

## 2015-10-29 DIAGNOSIS — M25572 Pain in left ankle and joints of left foot: Secondary | ICD-10-CM | POA: Insufficient documentation

## 2015-10-29 NOTE — Therapy (Signed)
[place], [place], [address] Phone: [phone]   Fax:  [phone]  Physical Therapy Treatment  Patient Details  Name: Karen Jackson MRN: [id number] Date of Birth: [birth year] Referring Provider: Andria Frames  Encounter Date: 10/29/2015      PT End of Session - 10/29/15 1204    Visit Number 13   Date for PT Re-Evaluation 11/13/15   PT Start Time 1004   PT Stop Time 1054   PT Time Calculation (min) 50 min   Activity Tolerance Patient tolerated treatment well   Behavior During Therapy [facility] for tasks assessed/performed      Past Medical History  Diagnosis Date  . Arthritis   . Hyperlipidemia   . Hypertension   . Osteopenia     Past Surgical History  Procedure Laterality Date  . Ankle surgery    . Joint replacement      total- right partial- left  . Appendectomy    . Cholecystectomy    . Hernia repair      Esophagus  . Mastectomy partial / lumpectomy  2012    left    There were no vitals filed for this visit.  Visit Diagnosis:  Late effects of CVA (cerebrovascular accident)  Difficulty walking  Debility  Weakness      Subjective Assessment - 10/29/15 1033    Subjective Patient continues to have some soreness in the left ankle and the knee.     Currently in Pain? Yes   Pain Score 3    Pain Location Ankle                         [facility] Adult PT Treatment/Exercise - 10/29/15 0001    Ambulation/Gait   Gait Comments Gait 220 feet with HHA, cues for posture and to pick up left foot as well as step length   High Level Balance   High Level Balance Activities Backward walking;Side stepping;Direction changes   High Level Balance Comments sit to stand with ball reach, standing weight shift to get weight on the left leg, standing trunk rotation   Knee/Hip Exercises: Aerobic   Tread Mill 1.0 mph x 6 minutes   Knee/Hip Exercises: Machines for Strengthening   Other  Machine standing on [employer] with help to keep the left ankle from rolling   Knee/Hip Exercises: Standing   Other Standing Knee Exercises sit to stand x 10 reps no hands, cue to stand straight   Knee/Hip Exercises: Sidelying   Other Sidelying Knee/Hip Exercises PWR moves in sitting, weighted ball obliques, and over head lifts   Ankle Exercises: Seated   Other Seated Ankle Exercises seated ankle on sit fit motions, seated yellow tband ankle exercises with a lot of cues and some help to perform   Other Seated Ankle Exercises tband DF and eversion                  PT Short Term Goals - 09/23/15 1137    PT SHORT TERM GOAL #1   Title independent with initial HEP   Status Achieved           PT Long Term Goals - 10/29/15 1207    PT LONG TERM GOAL #1   Title decrease timed up and go test to 25 seconds   Status Partially Met               Plan - 10/29/15 1205  Clinical Impression Statement She has problems with the left ankle rolling especially with unstable surfaces.  Tolerance to activity is better but she needs encouragment.   PT Next Visit Plan continue to push her to test herself with [place].   Consulted and Agree with Plan of Care Patient        Problem List Patient Active Problem List   Diagnosis Date Noted  . Morbid obesity ([place]) 06/17/2015  . Fall at home 12/01/2014  . Hypomagnesemia 04/24/2014  . Hemiparesis affecting left side as late effect of cerebrovascular accident ([place]) 04/24/2014  . Nontraumatic cerebral hemorrhage ([place]) 04/30/2012  . Diabetes type 2, controlled ([place]) 03/04/2010  . OSTEOPENIA 01/21/2009  . UNSPECIFIED VITAMIN D DEFICIENCY 11/19/2007  . ESSENTIAL HYPERTENSION, BENIGN 11/19/2007  . HYPERCHOLESTEROLEMIA 10/25/2006  . GASTROESOPHAGEAL REFLUX, NO ESOPHAGITIS 10/25/2006  . DIVERTICULOSIS OF COLON 10/25/2006  . Osteoarthritis 10/25/2006  . CERVICAL SPINE DISORDER, NOS 10/25/2006    Sumner Boast., PT 10/29/2015, 12:08  PM  [address] [place], [place], [address] Phone: [phone]   Fax:  [phone]  Name: [name] MRN: [id number] Date of Birth: Jul 21, 1941

## 2015-11-01 ENCOUNTER — Ambulatory Visit: Payer: Medicare Other | Admitting: Physical Therapy

## 2015-11-01 DIAGNOSIS — I6990 Unspecified sequelae of unspecified cerebrovascular disease: Secondary | ICD-10-CM | POA: Diagnosis not present

## 2015-11-01 DIAGNOSIS — R5381 Other malaise: Secondary | ICD-10-CM | POA: Diagnosis not present

## 2015-11-01 DIAGNOSIS — R262 Difficulty in walking, not elsewhere classified: Secondary | ICD-10-CM

## 2015-11-01 DIAGNOSIS — M25572 Pain in left ankle and joints of left foot: Secondary | ICD-10-CM | POA: Diagnosis not present

## 2015-11-01 DIAGNOSIS — R531 Weakness: Secondary | ICD-10-CM

## 2015-11-01 NOTE — Therapy (Signed)
[place], [place], [address] Phone: [phone]   Fax:  [phone]  Physical Therapy Treatment  Patient Details  Name: Karen Jackson MRN: [id number] Date of Birth: 1940-11-26 Referring Provider: Andria Frames  Encounter Date: 11/01/2015      PT End of Session - 11/01/15 1132    Visit Number 14   Date for PT Re-Evaluation 11/13/15   PT Start Time 1003   PT Stop Time 1100   PT Time Calculation (min) 57 min   Activity Tolerance Patient tolerated treatment well   Behavior During Therapy [facility] for tasks assessed/performed      Past Medical History  Diagnosis Date  . Arthritis   . Hyperlipidemia   . Hypertension   . Osteopenia     Past Surgical History  Procedure Laterality Date  . Ankle surgery    . Joint replacement      total- right partial- left  . Appendectomy    . Cholecystectomy    . Hernia repair      Esophagus  . Mastectomy partial / lumpectomy  2012    left    There were no vitals filed for this visit.  Visit Diagnosis:  Late effects of CVA (cerebrovascular accident)  Difficulty walking  Debility  Weakness      Subjective Assessment - 11/01/15 1130    Subjective Reports no issue with the weekend.  Some issues with the left ankle   Currently in Pain? No/denies                         [facility] [place] Adult PT Treatment/Exercise - 11/01/15 0001    Ambulation/Gait   Gait Comments Gait 220 feet with HHA, cues for posture and to pick up left foot as well as step length   High Level Balance   High Level Balance Activities Backward walking;Side stepping;Direction changes   Knee/Hip Exercises: Aerobic   Tread Mill 1.0 mph x 6 minutes   Knee/Hip Exercises: Machines for Strengthening   Cybex Knee Extension 10# 3x10   Cybex Knee Flexion 25# 2x15   Cybex Leg Press 30# bilateral legs, no weight left only   Other Machine standing on airex marches with help to keep the left  ankle from rolling   Knee/Hip Exercises: Sidelying   Other Sidelying Knee/Hip Exercises PWR moves in sitting, weighted ball obliques, and over head lifts   Ankle Exercises: Seated   Other Seated Ankle Exercises seated ankle on sit fit motions, seated yellow tband ankle exercises with a lot of cues and some help to perform   Other Seated Ankle Exercises tband DF and eversion                  PT Short Term Goals - 09/23/15 1137    PT SHORT TERM GOAL #1   Title independent with initial HEP   Status Achieved           PT Long Term Goals - 10/29/15 1207    PT LONG TERM GOAL #1   Title decrease timed up and go test to 25 seconds   Status Partially Met               Plan - 11/01/15 1133    Clinical Impression Statement Had one instance of the left knee buckling during the single leg leg press.  No rolling of the ankle today.  Did better today for a Monday, she  has typically had bad Mondays.  Able to show less left sided neglect with cues to watch left hand and pick up left foot.   PT Next Visit Plan continue with plan   Consulted and Agree with Plan of Care Patient        Problem List Patient Active Problem List   Diagnosis Date Noted  . Morbid obesity ([place]) 06/17/2015  . Fall at home 12/01/2014  . Hypomagnesemia 04/24/2014  . Hemiparesis affecting left side as late effect of cerebrovascular accident ([place]) 04/24/2014  . Nontraumatic cerebral hemorrhage ([place]) 04/30/2012  . Diabetes type 2, controlled ([place]) 03/04/2010  . OSTEOPENIA 01/21/2009  . UNSPECIFIED VITAMIN D DEFICIENCY 11/19/2007  . ESSENTIAL HYPERTENSION, BENIGN 11/19/2007  . HYPERCHOLESTEROLEMIA 10/25/2006  . GASTROESOPHAGEAL REFLUX, NO ESOPHAGITIS 10/25/2006  . DIVERTICULOSIS OF COLON 10/25/2006  . Osteoarthritis 10/25/2006  . CERVICAL SPINE DISORDER, NOS 10/25/2006    Sumner Boast., PT 11/01/2015, 11:35 AM  [place] [address] [facility] [place], [place], [address] Phone: [phone]   Fax:  [phone]  Name: Karen Jackson MRN: [id number] Date of Birth: 1941/08/13

## 2015-11-02 ENCOUNTER — Telehealth: Payer: Self-pay | Admitting: *Deleted

## 2015-11-02 NOTE — Telephone Encounter (Signed)
PA completed.

## 2015-11-02 NOTE — Telephone Encounter (Signed)
Prior Authorization received from [place] for Voltaren 1% gel.  PA form placed in provider box for completion. Derl Barrow, RN

## 2015-11-02 NOTE — Telephone Encounter (Signed)
PA form faxed to [employer] for review.  Review process could take 24-72 hours to complete.  Martin, Tamika L, RN  

## 2015-11-03 NOTE — Telephone Encounter (Signed)
PA for Voltaren gel approved until 08/27/16.  Reference number: [id number].  Derl Barrow, RN

## 2015-11-04 ENCOUNTER — Ambulatory Visit: Payer: Medicare Other | Admitting: Physical Therapy

## 2015-11-04 ENCOUNTER — Encounter: Payer: Self-pay | Admitting: Physical Therapy

## 2015-11-04 DIAGNOSIS — R262 Difficulty in walking, not elsewhere classified: Secondary | ICD-10-CM | POA: Diagnosis not present

## 2015-11-04 DIAGNOSIS — I6990 Unspecified sequelae of unspecified cerebrovascular disease: Secondary | ICD-10-CM | POA: Diagnosis not present

## 2015-11-04 DIAGNOSIS — R5381 Other malaise: Secondary | ICD-10-CM | POA: Diagnosis not present

## 2015-11-04 DIAGNOSIS — R531 Weakness: Secondary | ICD-10-CM

## 2015-11-04 DIAGNOSIS — M25572 Pain in left ankle and joints of left foot: Secondary | ICD-10-CM | POA: Diagnosis not present

## 2015-11-04 NOTE — Therapy (Signed)
[place] [address] [place], [place], [address] Phone: [phone]   Fax:  [phone]  Physical Therapy Treatment  Patient Details  Name: Karen Jackson MRN: [id number] Date of Birth: 1940/11/09 Referring Provider: Andria Frames  Encounter Date: 11/04/2015      PT End of Session - 11/04/15 1118    Visit Number 15   Date for PT Re-Evaluation 11/13/15   PT Start Time 1010   PT Stop Time 1100   PT Time Calculation (min) 50 min   Activity Tolerance Patient tolerated treatment well   Behavior During Therapy [facility] for tasks assessed/performed      Past Medical History  Diagnosis Date  . Arthritis   . Hyperlipidemia   . Hypertension   . Osteopenia     Past Surgical History  Procedure Laterality Date  . Ankle surgery    . Joint replacement      total- right partial- left  . Appendectomy    . Cholecystectomy    . Hernia repair      Esophagus  . Mastectomy partial / lumpectomy  2012    left    There were no vitals filed for this visit.  Visit Diagnosis:  Late effects of CVA (cerebrovascular accident)  Difficulty walking  Debility  Weakness      Subjective Assessment - 11/04/15 1115    Subjective No falls, no other issues with the left ankle rolling.     Currently in Pain? No/denies            [facility] PT Assessment - 11/04/15 0001    Timed Up and Go Test   Normal TUG (seconds) 27                     [facility] Adult PT Treatment/Exercise - 11/04/15 0001    Ambulation/Gait   Gait Comments Gait 220 feet with HHA, cues for posture and to pick up left foot as well as step length   High Level Balance   High Level Balance Comments sit to stand with ball reach, standing weight shift to get weight on the left leg, standing trunk rotation   Knee/Hip Exercises: Aerobic   Tread Mill 1.0 mph x 6 minutes   Knee/Hip Exercises: Machines for Strengthening   Cybex Leg Press 30# bilateral legs, no weight  left only, then 20# left only eccentrics with assist to get weight up   Other Machine standing on airex marches with help to keep the left ankle from rolling   Knee/Hip Exercises: Standing   Other Standing Knee Exercises seated red tband hip ER/IR   Knee/Hip Exercises: Supine   Other Supine Knee/Hip Exercises 8" toe clears   Other Supine Knee/Hip Exercises single leg standing hold 3 seconds   Ankle Exercises: Seated   Other Seated Ankle Exercises seated ankle on sit fit motions, seated yellow tband ankle exercises with a lot of cues and some help to perform   Other Seated Ankle Exercises tband DF and eversion                  PT Short Term Goals - 09/23/15 1137    PT SHORT TERM GOAL #1   Title independent with initial HEP   Status Achieved           PT Long Term Goals - 11/04/15 1122    PT LONG TERM GOAL #1   Title decrease timed up and go test to 25 seconds  Status Partially Met   PT LONG TERM GOAL #2   Title increased right LE strength to 4-/5   Status On-going   PT LONG TERM GOAL #5   Title walk 600 feet with supervision and FWW   Status On-going               Plan - 11/04/15 1119    Clinical Impression Statement Patient with good effort today.  She does have difficulty with that left hip going out in external rotation during stance and the left knee bending making her put all of her weight on her right leg, Worked on weight shift today with activating left leg mms to balance, tends to not like this.   PT Next Visit Plan work on the left hip, knee and ankle strength   Consulted and Agree with Plan of Care Patient        Problem List Patient Active Problem List   Diagnosis Date Noted  . Morbid obesity ([place]) 06/17/2015  . Fall at home 12/01/2014  . Hypomagnesemia 04/24/2014  . Hemiparesis affecting left side as late effect of cerebrovascular accident ([place]) 04/24/2014  . Nontraumatic cerebral hemorrhage ([place]) 04/30/2012  . Diabetes type 2,  controlled ([place]) 03/04/2010  . OSTEOPENIA 01/21/2009  . UNSPECIFIED VITAMIN D DEFICIENCY 11/19/2007  . ESSENTIAL HYPERTENSION, BENIGN 11/19/2007  . HYPERCHOLESTEROLEMIA 10/25/2006  . GASTROESOPHAGEAL REFLUX, NO ESOPHAGITIS 10/25/2006  . DIVERTICULOSIS OF COLON 10/25/2006  . Osteoarthritis 10/25/2006  . CERVICAL SPINE DISORDER, NOS 10/25/2006    Sumner Boast., PT 11/04/2015, 11:24 AM  [place] [address] [facility] [place], [place], [address] Phone: [phone]   Fax:  [phone]  Name: Karen Jackson MRN: [id number] Date of Birth: [birth year]

## 2015-11-08 ENCOUNTER — Ambulatory Visit: Payer: Medicare Other | Admitting: Physical Therapy

## 2015-11-08 ENCOUNTER — Encounter: Payer: Self-pay | Admitting: Physical Therapy

## 2015-11-08 DIAGNOSIS — I6990 Unspecified sequelae of unspecified cerebrovascular disease: Secondary | ICD-10-CM | POA: Diagnosis not present

## 2015-11-08 DIAGNOSIS — R262 Difficulty in walking, not elsewhere classified: Secondary | ICD-10-CM | POA: Diagnosis not present

## 2015-11-08 DIAGNOSIS — R5381 Other malaise: Secondary | ICD-10-CM

## 2015-11-08 DIAGNOSIS — R531 Weakness: Secondary | ICD-10-CM | POA: Diagnosis not present

## 2015-11-08 DIAGNOSIS — M25572 Pain in left ankle and joints of left foot: Secondary | ICD-10-CM | POA: Diagnosis not present

## 2015-11-08 NOTE — Therapy (Signed)
[place], [place], [address] Phone: [phone]   Fax:  [phone]  Physical Therapy Treatment  Patient Details  Name: Karen Jackson MRN: [id number] Date of Birth: 1941/08/04 Referring Provider: Andria Frames  Encounter Date: 11/08/2015      PT End of Session - 11/08/15 1053    Visit Number 16   Date for PT Re-Evaluation 11/13/15   PT Start Time 1008   PT Stop Time 1100   PT Time Calculation (min) 52 min   Activity Tolerance Patient tolerated treatment well   Behavior During Therapy [facility] for tasks assessed/performed      Past Medical History  Diagnosis Date  . Arthritis   . Hyperlipidemia   . Hypertension   . Osteopenia     Past Surgical History  Procedure Laterality Date  . Ankle surgery    . Joint replacement      total- right partial- left  . Appendectomy    . Cholecystectomy    . Hernia repair      Esophagus  . Mastectomy partial / lumpectomy  2012    left    There were no vitals filed for this visit.  Visit Diagnosis:  Late effects of CVA (cerebrovascular accident)  Difficulty walking  Debility  Weakness      Subjective Assessment - 11/08/15 1052    Subjective I was really tired after the last treatment, where I had difficulty getting up from sitting, but felt better the next day   Currently in Pain? No/denies                         [facility] Adult PT Treatment/Exercise - 11/08/15 0001    Ambulation/Gait   Gait Comments 240' HHA, working to go at a faster Health visitor Level Balance Activities Side stepping   Knee/Hip Exercises: Aerobic   Tread Mill 1.0 mph x 6 minutes   Knee/Hip Exercises: Machines for Strengthening   Cybex Knee Extension 10# 3x10   Cybex Knee Flexion 25# 2x15   Cybex Leg Press 30# bilateral legs, no weight left only, then 20# left only eccentrics with assist to get weight up   Knee/Hip Exercises: Standing   Other Standing Knee Exercises seated red tband hip ER/IR   Knee/Hip Exercises: Supine   Other Supine Knee/Hip Exercises 8" toe clears   Other Supine Knee/Hip Exercises single leg standing hold 3 seconds   Ankle Exercises: Seated   Other Seated Ankle Exercises seated ankle on sit fit motions, seated yellow tband ankle exercises with a lot of cues and some help to perform   Other Seated Ankle Exercises tband DF and eversion                  PT Short Term Goals - 09/23/15 1137    PT SHORT TERM GOAL #1   Title independent with initial HEP   Status Achieved           PT Long Term Goals - 11/04/15 1122    PT LONG TERM GOAL #1   Title decrease timed up and go test to 25 seconds   Status Partially Met   PT LONG TERM GOAL #2   Title increased right LE strength to 4-/5   Status On-going   PT LONG TERM GOAL #5   Title walk 600 feet with supervision and FWW   Status On-going  Plan - 11/08/15 1135    Clinical Impression Statement She seems to be doing better over the weekends, in the past on a Monday she would be very poor with her ability to pick up the feet and seemed to have more neglect.  The past two Mondays she has done much better, less neglect.   PT Next Visit Plan continue with functional gait and strength   Consulted and Agree with Plan of Care Patient        Problem List Patient Active Problem List   Diagnosis Date Noted  . Morbid obesity ([place]) 06/17/2015  . Fall at home 12/01/2014  . Hypomagnesemia 04/24/2014  . Hemiparesis affecting left side as late effect of cerebrovascular accident ([place]) 04/24/2014  . Nontraumatic cerebral hemorrhage ([place]) 04/30/2012  . Diabetes type 2, controlled ([place]) 03/04/2010  . OSTEOPENIA 01/21/2009  . UNSPECIFIED VITAMIN D DEFICIENCY 11/19/2007  . ESSENTIAL HYPERTENSION, BENIGN 11/19/2007  . HYPERCHOLESTEROLEMIA 10/25/2006  . GASTROESOPHAGEAL REFLUX, NO ESOPHAGITIS 10/25/2006  . DIVERTICULOSIS OF COLON  10/25/2006  . Osteoarthritis 10/25/2006  . CERVICAL SPINE DISORDER, NOS 10/25/2006    Sumner Boast., PT 11/08/2015, 11:45 AM  [place] [address] [place], [place], [address] Phone: [phone]   Fax:  [phone]  Name: Karen Jackson MRN: [id number] Date of Birth: 02/25/1941

## 2015-11-12 ENCOUNTER — Ambulatory Visit: Payer: Medicare Other | Admitting: Physical Therapy

## 2015-11-12 ENCOUNTER — Encounter: Payer: Self-pay | Admitting: Physical Therapy

## 2015-11-12 DIAGNOSIS — R531 Weakness: Secondary | ICD-10-CM | POA: Diagnosis not present

## 2015-11-12 DIAGNOSIS — R262 Difficulty in walking, not elsewhere classified: Secondary | ICD-10-CM | POA: Diagnosis not present

## 2015-11-12 DIAGNOSIS — R5381 Other malaise: Secondary | ICD-10-CM

## 2015-11-12 DIAGNOSIS — M25572 Pain in left ankle and joints of left foot: Secondary | ICD-10-CM

## 2015-11-12 DIAGNOSIS — I6990 Unspecified sequelae of unspecified cerebrovascular disease: Secondary | ICD-10-CM

## 2015-11-12 NOTE — Therapy (Signed)
[place] [address] [place], [place], [address] Phone: [phone]   Fax:  [phone]  Physical Therapy Treatment  Patient Details  Name: Karen Jackson MRN: [id number] Date of Birth: 05-20-1941 Referring Provider: Andria Frames  Encounter Date: 11/12/2015      PT End of Session - 11/12/15 1057    Visit Number 17   Date for PT Re-Evaluation 11/13/15   PT Start Time 1004   PT Stop Time 1053   PT Time Calculation (min) 49 min   Activity Tolerance Patient tolerated treatment well   Behavior During Therapy [facility] for tasks assessed/performed      Past Medical History  Diagnosis Date  . Arthritis   . Hyperlipidemia   . Hypertension   . Osteopenia     Past Surgical History  Procedure Laterality Date  . Ankle surgery    . Joint replacement      total- right partial- left  . Appendectomy    . Cholecystectomy    . Hernia repair      Esophagus  . Mastectomy partial / lumpectomy  2012    left    There were no vitals filed for this visit.  Visit Diagnosis:  Late effects of CVA (cerebrovascular accident)  Difficulty walking  Debility  Weakness  Left ankle pain      Subjective Assessment - 11/12/15 1030    Subjective Pretty good day, feeling a little better, less stumbles   Currently in Pain? No/denies            [facility] PT Assessment - 11/12/15 0001    Timed Up and Go Test   Normal TUG (seconds) 25                     [facility] Adult PT Treatment/Exercise - 11/12/15 0001    Ambulation/Gait   Gait Comments HHA at a fast speed x 150 feet   High Level Balance   High Level Balance Activities Side stepping;Backward walking;Marching forwards   High Level Balance Comments sit to stand with ball reach, standing weight shift to get weight on the left leg, standing trunk rotation   Knee/Hip Exercises: Aerobic   Tread Mill 1.0 mph x 6 minutes   Knee/Hip Exercises: Machines for Strengthening   Cybex Knee Extension 10# 3x10   Cybex Knee Flexion 25# 2x15   Cybex Leg Press 30# bilateral legs, no weight left only, then 20# left only eccentrics with assist to get weight up   Other Machine standing on airex marches with help to keep the left ankle from rolling   Knee/Hip Exercises: Standing   Other Standing Knee Exercises seated red tband hip ER/IR   Knee/Hip Exercises: Supine   Other Supine Knee/Hip Exercises 8" toe clears   Other Supine Knee/Hip Exercises single leg standing hold 3 seconds   Knee/Hip Exercises: Sidelying   Other Sidelying Knee/Hip Exercises PWR moves in sitting, weighted ball obliques, and over head lifts   Ankle Exercises: Seated   Other Seated Ankle Exercises seated ankle on sit fit motions, seated yellow tband ankle exercises with a lot of cues and some help to perform   Other Seated Ankle Exercises tband DF and eversion                  PT Short Term Goals - 09/23/15 1137    PT SHORT TERM GOAL #1   Title independent with initial HEP   Status Achieved  PT Long Term Goals - 11/12/15 1146    PT LONG TERM GOAL #1   Title decrease timed up and go test to 25 seconds   Status Partially Met   PT LONG TERM GOAL #2   Title increased right LE strength to 4-/5   Status On-going   PT LONG TERM GOAL #5   Title walk 600 feet with supervision and FWW   Status On-going               Plan - 11/12/15 1145    Clinical Impression Statement Did very well with activity today, no signs of the ankle rolling, she reports that the only time it feels like it happens is with her pulling her pants up and on.  again a time where she would be multitasking and would have some neglect.   PT Next Visit Plan continue with functional gait and strength   Consulted and Agree with Plan of Care Patient        Problem List Patient Active Problem List   Diagnosis Date Noted  . Morbid obesity ([place]) 06/17/2015  . Fall at home 12/01/2014  . Hypomagnesemia  04/24/2014  . Hemiparesis affecting left side as late effect of cerebrovascular accident ([place]) 04/24/2014  . Nontraumatic cerebral hemorrhage ([facility]) 04/30/2012  . Diabetes type 2, controlled ([place]) 03/04/2010  . OSTEOPENIA 01/21/2009  . UNSPECIFIED VITAMIN D DEFICIENCY 11/19/2007  . ESSENTIAL HYPERTENSION, BENIGN 11/19/2007  . HYPERCHOLESTEROLEMIA 10/25/2006  . GASTROESOPHAGEAL REFLUX, NO ESOPHAGITIS 10/25/2006  . DIVERTICULOSIS OF COLON 10/25/2006  . Osteoarthritis 10/25/2006  . CERVICAL SPINE DISORDER, NOS 10/25/2006    Sumner Boast., PT 11/12/2015, 11:47 AM  [place] [address] [place], [place], [address] Phone: [phone]   Fax:  [phone]  Name: Karen Jackson MRN: [id number] Date of Birth: [birth year]

## 2015-11-15 ENCOUNTER — Encounter: Payer: Self-pay | Admitting: Physical Therapy

## 2015-11-15 ENCOUNTER — Ambulatory Visit: Payer: Medicare Other | Admitting: Physical Therapy

## 2015-11-15 DIAGNOSIS — R5381 Other malaise: Secondary | ICD-10-CM

## 2015-11-15 DIAGNOSIS — I6990 Unspecified sequelae of unspecified cerebrovascular disease: Secondary | ICD-10-CM

## 2015-11-15 DIAGNOSIS — M25572 Pain in left ankle and joints of left foot: Secondary | ICD-10-CM

## 2015-11-15 DIAGNOSIS — R262 Difficulty in walking, not elsewhere classified: Secondary | ICD-10-CM | POA: Diagnosis not present

## 2015-11-15 DIAGNOSIS — R531 Weakness: Secondary | ICD-10-CM

## 2015-11-15 NOTE — Therapy (Signed)
[address] [address] [place], [place], [address] Phone: [phone]   Fax:  [phone]  Physical Therapy Treatment  Patient Details  Name: Karen Jackson MRN: [id number] Date of Birth: 22-Aug-1941 Referring Provider: Andria Frames  Encounter Date: 11/15/2015      PT End of Session - 11/15/15 1048    Visit Number 18   Date for PT Re-Evaluation 12/13/15   PT Start Time 1005   PT Stop Time 1055   PT Time Calculation (min) 50 min   Activity Tolerance Patient tolerated treatment well   Behavior During Therapy [facility] for tasks assessed/performed      Past Medical History  Diagnosis Date  . Arthritis   . Hyperlipidemia   . Hypertension   . Osteopenia     Past Surgical History  Procedure Laterality Date  . Ankle surgery    . Joint replacement      total- right partial- left  . Appendectomy    . Cholecystectomy    . Hernia repair      Esophagus  . Mastectomy partial / lumpectomy  2012    left    There were no vitals filed for this visit.  Visit Diagnosis:  Late effects of CVA (cerebrovascular accident)  Difficulty walking  Debility  Weakness  Left ankle pain      Subjective Assessment - 11/15/15 1028    Subjective My shoulder is sore.  No rolling of the ankle.   Currently in Pain? Yes   Pain Score 3    Pain Location Shoulder   Pain Orientation Left   Aggravating Factors  unsure   Pain Relieving Factors heat                         [facility] Adult PT Treatment/Exercise - 11/15/15 0001    Ambulation/Gait   Gait Comments Gait HHA 180' cues for longer steps   High Level Balance   High Level Balance Activities Side stepping;Backward walking;Marching forwards   Knee/Hip Exercises: Aerobic   Tread Mill 1.0 mph x 6 minutes   Knee/Hip Exercises: Machines for Strengthening   Cybex Leg Press 40# bilateral legs, no weight left only, then 20# left only eccentrics with assist to get weight up   Other Machine standing on airex marches with help to keep the left ankle from rolling   Knee/Hip Exercises: Standing   Other Standing Knee Exercises 8" toe touches, 6" step ups needed assist with this today especially to clear the left foot    Knee/Hip Exercises: Supine   Other Supine Knee/Hip Exercises single leg standing hold 3 seconds   Knee/Hip Exercises: Sidelying   Other Sidelying Knee/Hip Exercises PWR moves in sitting, weighted ball obliques, and over head lifts                  PT Short Term Goals - 09/23/15 1137    PT SHORT TERM GOAL #1   Title independent with initial HEP   Status Achieved           PT Long Term Goals - 11/15/15 1050    PT LONG TERM GOAL #1   Title decrease timed up and go test to 25 seconds   Status Partially Met   PT LONG TERM GOAL #2   Title increased right LE strength to 4-/5   Status Achieved   PT LONG TERM GOAL #3   Title increased left LE strength to 3+/5  for the hip and knee   Status Partially Met   PT LONG TERM GOAL #4   Title no falls   Status Achieved   PT LONG TERM GOAL #5   Title walk 600 feet with supervision and FWW   Status Partially Met               Plan - 11/15/15 1049    Clinical Impression Statement Patient with some increased left shoulder pain, we worked on 6" steps today, had difficulty clearing her left foot, seems to be mostly neglect as her right leg is strong, but has difficulty extending right knee to get upo while at the same time bending the left knee to clear the toe.   PT Next Visit Plan will work over the next week to get her more independent and look to D/C and have her try on her own at home or at gym setting   Consulted and Agree with Plan of Care Patient        Problem List Patient Active Problem List   Diagnosis Date Noted  . Morbid obesity ([place]) 06/17/2015  . Fall at home 12/01/2014  . Hypomagnesemia 04/24/2014  . Hemiparesis affecting left side as late effect of cerebrovascular  accident ([place]) 04/24/2014  . Nontraumatic cerebral hemorrhage ([place]) 04/30/2012  . Diabetes type 2, controlled ([place]) 03/04/2010  . OSTEOPENIA 01/21/2009  . UNSPECIFIED VITAMIN D DEFICIENCY 11/19/2007  . ESSENTIAL HYPERTENSION, BENIGN 11/19/2007  . HYPERCHOLESTEROLEMIA 10/25/2006  . GASTROESOPHAGEAL REFLUX, NO ESOPHAGITIS 10/25/2006  . DIVERTICULOSIS OF COLON 10/25/2006  . Osteoarthritis 10/25/2006  . CERVICAL SPINE DISORDER, NOS 10/25/2006    Sumner Boast., PT 11/15/2015, 10:52 AM  [place] [place], [place], [address] Phone: [phone]   Fax:  [phone]  Name: GLORIAN MCDONELL MRN: [id number] Date of Birth: [birth year]

## 2015-11-18 ENCOUNTER — Ambulatory Visit: Payer: Medicare Other | Admitting: Physical Therapy

## 2015-11-18 ENCOUNTER — Encounter: Payer: Self-pay | Admitting: Physical Therapy

## 2015-11-18 DIAGNOSIS — R531 Weakness: Secondary | ICD-10-CM

## 2015-11-18 DIAGNOSIS — R262 Difficulty in walking, not elsewhere classified: Secondary | ICD-10-CM | POA: Diagnosis not present

## 2015-11-18 DIAGNOSIS — R5381 Other malaise: Secondary | ICD-10-CM | POA: Diagnosis not present

## 2015-11-18 DIAGNOSIS — I6990 Unspecified sequelae of unspecified cerebrovascular disease: Secondary | ICD-10-CM | POA: Diagnosis not present

## 2015-11-18 DIAGNOSIS — M25572 Pain in left ankle and joints of left foot: Secondary | ICD-10-CM | POA: Diagnosis not present

## 2015-11-18 NOTE — Therapy (Signed)
[place] [address] [place], [place], [address] Phone: [phone]   Fax:  [phone]  Physical Therapy Treatment  Patient Details  Name: AUDRIANNA DRISKILL MRN: [id number] Date of Birth: 09-23-1940 Referring Provider: Andria Frames  Encounter Date: 11/18/2015      PT End of Session - 11/18/15 1055    Visit Number 19   Date for PT Re-Evaluation 12/13/15   PT Start Time 1010   PT Stop Time 1055   PT Time Calculation (min) 45 min   Activity Tolerance Patient tolerated treatment well   Behavior During Therapy [facility] for tasks assessed/performed      Past Medical History  Diagnosis Date  . Arthritis   . Hyperlipidemia   . Hypertension   . Osteopenia     Past Surgical History  Procedure Laterality Date  . Ankle surgery    . Joint replacement      total- right partial- left  . Appendectomy    . Cholecystectomy    . Hernia repair      Esophagus  . Mastectomy partial / lumpectomy  2012    left    There were no vitals filed for this visit.  Visit Diagnosis:  Late effects of CVA (cerebrovascular accident)  Difficulty walking  Debility  Weakness      Subjective Assessment - 11/18/15 1054    Subjective I have been doing pretty good, I am very tired for the next day after I am here.   Currently in Pain? No/denies                         [facility] Adult PT Treatment/Exercise - 11/18/15 0001    Ambulation/Gait   Gait Comments 180' 4 different times really focused on speed and step length   High Level Balance   High Level Balance Activities Side stepping;Backward walking;Marching forwards   Knee/Hip Exercises: Aerobic   Tread Mill 1.0 mph x 6 minutes   Knee/Hip Exercises: Machines for Strengthening   Cybex Leg Press 40# bilateral legs, no weight left only, then 20# left only eccentrics with assist to get weight up   Other Machine standing on airex marches with help to keep the left ankle from  rolling   Ankle Exercises: Seated   Other Seated Ankle Exercises seated ankle on sit fit motions, seated yellow tband ankle exercises with a lot of cues and some help to perform   Other Seated Ankle Exercises tband DF and eversion                  PT Short Term Goals - 09/23/15 1137    PT SHORT TERM GOAL #1   Title independent with initial HEP   Status Achieved           PT Long Term Goals - 11/15/15 1050    PT LONG TERM GOAL #1   Title decrease timed up and go test to 25 seconds   Status Partially Met   PT LONG TERM GOAL #2   Title increased right LE strength to 4-/5   Status Achieved   PT LONG TERM GOAL #3   Title increased left LE strength to 3+/5 for the hip and knee   Status Partially Met   PT LONG TERM GOAL #4   Title no falls   Status Achieved   PT LONG TERM GOAL #5   Title walk 600 feet with supervision and FWW  Status Partially Met               Plan - 11/18/15 1100    Clinical Impression Statement Patient did very well, was very tired after the walks.  She had less issues catching her toe   PT Next Visit Plan will work over the next week to get her more independent and look to D/C and have her try on her own at home or at gym setting   Consulted and Agree with Plan of Care Patient        Problem List Patient Active Problem List   Diagnosis Date Noted  . Morbid obesity ([facility]) 06/17/2015  . Fall at home 12/01/2014  . Hypomagnesemia 04/24/2014  . Hemiparesis affecting left side as late effect of cerebrovascular accident ([facility]) 04/24/2014  . Nontraumatic cerebral hemorrhage ([facility]) 04/30/2012  . Diabetes type 2, controlled ([facility]) 03/04/2010  . OSTEOPENIA 01/21/2009  . UNSPECIFIED VITAMIN D DEFICIENCY 11/19/2007  . ESSENTIAL HYPERTENSION, BENIGN 11/19/2007  . HYPERCHOLESTEROLEMIA 10/25/2006  . GASTROESOPHAGEAL REFLUX, NO ESOPHAGITIS 10/25/2006  . DIVERTICULOSIS OF COLON 10/25/2006  . Osteoarthritis 10/25/2006  . CERVICAL SPINE DISORDER,  NOS 10/25/2006    , W., PT 11/18/2015, 11:05 AM  [facility] [address] [place], [place], [address] Phone: [phone]   Fax:  [phone]  Name: Angelika F Geesey MRN: [id number] Date of Birth: 04/07/1941     

## 2015-11-22 DIAGNOSIS — D1801 Hemangioma of skin and subcutaneous tissue: Secondary | ICD-10-CM | POA: Diagnosis not present

## 2015-11-22 DIAGNOSIS — D485 Neoplasm of uncertain behavior of skin: Secondary | ICD-10-CM | POA: Diagnosis not present

## 2015-11-22 DIAGNOSIS — Z85828 Personal history of other malignant neoplasm of skin: Secondary | ICD-10-CM | POA: Diagnosis not present

## 2015-11-22 DIAGNOSIS — L570 Actinic keratosis: Secondary | ICD-10-CM | POA: Diagnosis not present

## 2015-11-22 DIAGNOSIS — L821 Other seborrheic keratosis: Secondary | ICD-10-CM | POA: Diagnosis not present

## 2015-11-24 ENCOUNTER — Ambulatory Visit: Payer: Medicare Other | Admitting: Physical Therapy

## 2015-11-24 ENCOUNTER — Encounter: Payer: Self-pay | Admitting: Physical Therapy

## 2015-11-24 DIAGNOSIS — R262 Difficulty in walking, not elsewhere classified: Secondary | ICD-10-CM

## 2015-11-24 DIAGNOSIS — R531 Weakness: Secondary | ICD-10-CM | POA: Diagnosis not present

## 2015-11-24 DIAGNOSIS — R5381 Other malaise: Secondary | ICD-10-CM

## 2015-11-24 DIAGNOSIS — M25572 Pain in left ankle and joints of left foot: Secondary | ICD-10-CM | POA: Diagnosis not present

## 2015-11-24 DIAGNOSIS — I6990 Unspecified sequelae of unspecified cerebrovascular disease: Secondary | ICD-10-CM | POA: Diagnosis not present

## 2015-11-24 NOTE — Therapy (Signed)
[place] [address] [place], [place], [address] Phone: [phone]   Fax:  [phone]  Physical Therapy Treatment  Patient Details  Name: Karen Jackson MRN: [id number] Date of Birth: 1940/12/01 Referring Provider: Andria Frames  Encounter Date: 11/24/2015      PT End of Session - 11/24/15 1050    Visit Number 20   Date for PT Re-Evaluation 12/13/15   PT Start Time 1007   PT Stop Time 1054   PT Time Calculation (min) 47 min   Activity Tolerance Patient tolerated treatment well   Behavior During Therapy [facility] for tasks assessed/performed      Past Medical History  Diagnosis Date  . Arthritis   . Hyperlipidemia   . Hypertension   . Osteopenia     Past Surgical History  Procedure Laterality Date  . Ankle surgery    . Joint replacement      total- right partial- left  . Appendectomy    . Cholecystectomy    . Hernia repair      Esophagus  . Mastectomy partial / lumpectomy  2012    left    There were no vitals filed for this visit.  Visit Diagnosis:  Late effects of CVA (cerebrovascular accident)  Difficulty walking  Debility  Weakness      Subjective Assessment - 11/24/15 1036    Subjective My left arm was off yesterday, but better today.   Currently in Pain? No/denies                         [facility] Adult PT Treatment/Exercise - 11/24/15 0001    Ambulation/Gait   Gait Comments 180' 4 different times really focused on speed and step length   High Level Balance   High Level Balance Activities Side stepping;Backward walking;Marching forwards   High Level Balance Comments sit to stand with ball reach, standing weight shift to get weight on the left leg, standing trunk rotation   Knee/Hip Exercises: Aerobic   Tread Mill 1.0 mph x 7 minutes   Knee/Hip Exercises: Machines for Strengthening   Cybex Leg Press 40# bilateral legs, no weight left only, then 20# left only eccentrics with  assist to get weight up   Other Machine standing on airex marches with help to keep the left ankle from rolling   Knee/Hip Exercises: Standing   Other Standing Knee Exercises 8" toe touches, 6" step ups needed assist with this today especially to clear the left foot    Other Standing Knee Exercises seated red tband hip ER/IR   Knee/Hip Exercises: Supine   Other Supine Knee/Hip Exercises single leg standing hold 3 seconds                  PT Short Term Goals - 09/23/15 1137    PT SHORT TERM GOAL #1   Title independent with initial HEP   Status Achieved           PT Long Term Goals - 11/15/15 1050    PT LONG TERM GOAL #1   Title decrease timed up and go test to 25 seconds   Status Partially Met   PT LONG TERM GOAL #2   Title increased right LE strength to 4-/5   Status Achieved   PT LONG TERM GOAL #3   Title increased left LE strength to 3+/5 for the hip and knee   Status Partially Met   PT LONG  TERM GOAL #4   Title no falls   Status Achieved   PT LONG TERM GOAL #5   Title walk 600 feet with supervision and FWW   Status Partially Met               Plan - 11/24/15 1051    Clinical Impression Statement Conitnues to progress with her function but remains neglectful of the left side, needs cues to use the left hand. and pay attention to the left LE.   PT Next Visit Plan possible D/C next visit, assure that she will do HEP or exercises at home/gym   Consulted and Agree with Plan of Care Patient        Problem List Patient Active Problem List   Diagnosis Date Noted  . Morbid obesity ([place]) 06/17/2015  . Fall at home 12/01/2014  . Hypomagnesemia 04/24/2014  . Hemiparesis affecting left side as late effect of cerebrovascular accident ([place]) 04/24/2014  . Nontraumatic cerebral hemorrhage ([place]) 04/30/2012  . Diabetes type 2, controlled ([place]) 03/04/2010  . OSTEOPENIA 01/21/2009  . UNSPECIFIED VITAMIN D DEFICIENCY 11/19/2007  . ESSENTIAL HYPERTENSION,  BENIGN 11/19/2007  . HYPERCHOLESTEROLEMIA 10/25/2006  . GASTROESOPHAGEAL REFLUX, NO ESOPHAGITIS 10/25/2006  . DIVERTICULOSIS OF COLON 10/25/2006  . Osteoarthritis 10/25/2006  . CERVICAL SPINE DISORDER, NOS 10/25/2006    Sumner Boast., PT 11/24/2015, 11:05 AM  [address] [place], [place], [address] Phone: [phone]   Fax:  [phone]  Name: Karen Jackson MRN: [id number] Date of Birth: 03-29-1941

## 2015-11-26 ENCOUNTER — Encounter: Payer: Self-pay | Admitting: Physical Therapy

## 2015-11-26 ENCOUNTER — Ambulatory Visit: Payer: Medicare Other | Admitting: Physical Therapy

## 2015-11-26 DIAGNOSIS — R531 Weakness: Secondary | ICD-10-CM

## 2015-11-26 DIAGNOSIS — R262 Difficulty in walking, not elsewhere classified: Secondary | ICD-10-CM

## 2015-11-26 DIAGNOSIS — I6990 Unspecified sequelae of unspecified cerebrovascular disease: Secondary | ICD-10-CM | POA: Diagnosis not present

## 2015-11-26 DIAGNOSIS — R5381 Other malaise: Secondary | ICD-10-CM | POA: Diagnosis not present

## 2015-11-26 DIAGNOSIS — M25572 Pain in left ankle and joints of left foot: Secondary | ICD-10-CM | POA: Diagnosis not present

## 2015-11-26 NOTE — Therapy (Signed)
[place] [address] [place], [place], [address] Phone: [phone]   Fax:  [phone]  Physical Therapy Treatment  Patient Details  Name: Karen Jackson MRN: [id number] Date of Birth: 1941/06/14 Referring Provider: Andria Frames  Encounter Date: 11/26/2015      PT End of Session - 11/26/15 1139    Visit Number 21   Date for PT Re-Evaluation 12/13/15   PT Start Time 1057   PT Stop Time 1148   PT Time Calculation (min) 51 min   Activity Tolerance Patient tolerated treatment well   Behavior During Therapy [facility] for tasks assessed/performed      Past Medical History  Diagnosis Date  . Arthritis   . Hyperlipidemia   . Hypertension   . Osteopenia     Past Surgical History  Procedure Laterality Date  . Ankle surgery    . Joint replacement      total- right partial- left  . Appendectomy    . Cholecystectomy    . Hernia repair      Esophagus  . Mastectomy partial / lumpectomy  2012    left    There were no vitals filed for this visit.  Visit Diagnosis:  Late effects of CVA (cerebrovascular accident)  Difficulty walking  Debility  Weakness  Left ankle pain      Subjective Assessment - 11/26/15 1116    Subjective Pretty good, no problems   Currently in Pain? No/denies            [facility] PT Assessment - 11/26/15 0001    AROM   Left Shoulder Flexion 120 Degrees   Left Shoulder ABduction 100 Degrees   Left Shoulder Internal Rotation 45 Degrees   Left Shoulder External Rotation 80 Degrees   Left Ankle Dorsiflexion 10   Timed Up and Go Test   Normal TUG (seconds) 23                     [facility] Adult PT Treatment/Exercise - 11/26/15 0001    Ambulation/Gait   Gait Comments 250' with HHA, less cues for step length   Knee/Hip Exercises: Aerobic   Tread Mill 1.0 mph x 7 minutes   Knee/Hip Exercises: Machines for Strengthening   Cybex Knee Extension 5# left only 4x5 reps   Cybex Knee  Flexion 15# left only 2x10 reps   Cybex Leg Press 40# bilateral legs, no weight left only, then 20# left only eccentrics with assist to get weight up   Other Machine standing on airex marches with help to keep the left ankle from rolling   Knee/Hip Exercises: Supine   Other Supine Knee/Hip Exercises single leg standing hold 3 seconds   Knee/Hip Exercises: Sidelying   Other Sidelying Knee/Hip Exercises PWR moves in sitting, weighted ball obliques, and over head lifts                PT Education - 11/26/15 1138    Education provided Yes   Education Details asked patient to really focus on the left leg only exercises and explained neglect   Person(s) Educated Patient   Methods Explanation   Comprehension Verbalized understanding          PT Short Term Goals - 09/23/15 1137    PT SHORT TERM GOAL #1   Title independent with initial HEP   Status Achieved           PT Long Term Goals - 11/26/15 1152  PT LONG TERM GOAL #1   Title decrease timed up and go test to 25 seconds   Status Achieved   PT LONG TERM GOAL #2   Title increased right LE strength to 4-/5   Status Achieved   PT LONG TERM GOAL #3   Title increased left LE strength to 3+/5 for the hip and knee   Status Achieved   PT LONG TERM GOAL #4   Title no falls   Status Achieved   PT LONG TERM GOAL #5   Title walk 600 feet with supervision and FWW   Status Partially Met               Plan - 11/26/15 1140    Clinical Impression Statement Patient made very good progress in her functional ability, her ROM of the left shoulder and her walking.  From having difficulty walking 50 feet to now walking 250 feet without rest using a walker.  She however continues to have issues regarding left side neglect,.  She decreased her tug time which should help with falls and functional gait, she however still will have some times where the left ankle will roll and needs to really watch this.     PT Next Visit Plan  patient will try exercises on own at home with the focus ont he left leg, we will d/c   Consulted and Agree with Plan of Care Patient        Problem List Patient Active Problem List   Diagnosis Date Noted  . Morbid obesity ([place]) 06/17/2015  . Fall at home 12/01/2014  . Hypomagnesemia 04/24/2014  . Hemiparesis affecting left side as late effect of cerebrovascular accident ([place]) 04/24/2014  . Nontraumatic cerebral hemorrhage ([place]) 04/30/2012  . Diabetes type 2, controlled ([place]) 03/04/2010  . OSTEOPENIA 01/21/2009  . UNSPECIFIED VITAMIN D DEFICIENCY 11/19/2007  . ESSENTIAL HYPERTENSION, BENIGN 11/19/2007  . HYPERCHOLESTEROLEMIA 10/25/2006  . GASTROESOPHAGEAL REFLUX, NO ESOPHAGITIS 10/25/2006  . DIVERTICULOSIS OF COLON 10/25/2006  . Osteoarthritis 10/25/2006  . CERVICAL SPINE DISORDER, NOS 10/25/2006  PHYSICAL THERAPY DISCHARGE SUMMARY   Plan: Patient agrees to discharge.  Patient goals were met. Patient is being discharged due to meeting the stated rehab goals.  ?????       Sumner Boast., PT 11/26/2015, 11:54 AM  [address] [place], [place], [address] Phone: [phone]   Fax:  [phone]  Name: Karen Jackson MRN: [id number] Date of Birth: July 17, 1941

## 2015-12-29 ENCOUNTER — Other Ambulatory Visit: Payer: Self-pay | Admitting: Family Medicine

## 2016-01-10 DIAGNOSIS — H6123 Impacted cerumen, bilateral: Secondary | ICD-10-CM | POA: Diagnosis not present

## 2016-01-10 DIAGNOSIS — J209 Acute bronchitis, unspecified: Secondary | ICD-10-CM | POA: Diagnosis not present

## 2016-01-15 DIAGNOSIS — J209 Acute bronchitis, unspecified: Secondary | ICD-10-CM | POA: Diagnosis not present

## 2016-01-15 DIAGNOSIS — R062 Wheezing: Secondary | ICD-10-CM | POA: Diagnosis not present

## 2016-01-26 DIAGNOSIS — H6123 Impacted cerumen, bilateral: Secondary | ICD-10-CM | POA: Diagnosis not present

## 2016-01-26 DIAGNOSIS — H903 Sensorineural hearing loss, bilateral: Secondary | ICD-10-CM | POA: Diagnosis not present

## 2016-02-16 ENCOUNTER — Ambulatory Visit (INDEPENDENT_AMBULATORY_CARE_PROVIDER_SITE_OTHER): Payer: Medicare Other | Admitting: Family Medicine

## 2016-02-16 ENCOUNTER — Encounter: Payer: Self-pay | Admitting: Family Medicine

## 2016-02-16 VITALS — BP 132/64 | HR 80 | Temp 97.8°F | Ht <= 58 in | Wt 180.0 lb

## 2016-02-16 DIAGNOSIS — I10 Essential (primary) hypertension: Secondary | ICD-10-CM | POA: Diagnosis not present

## 2016-02-16 DIAGNOSIS — Z0000 Encounter for general adult medical examination without abnormal findings: Secondary | ICD-10-CM

## 2016-02-16 DIAGNOSIS — I69354 Hemiplegia and hemiparesis following cerebral infarction affecting left non-dominant side: Secondary | ICD-10-CM

## 2016-02-16 DIAGNOSIS — E1121 Type 2 diabetes mellitus with diabetic nephropathy: Secondary | ICD-10-CM | POA: Diagnosis not present

## 2016-02-16 LAB — POCT GLYCOSYLATED HEMOGLOBIN (HGB A1C): HEMOGLOBIN A1C: 6.8

## 2016-02-16 MED ORDER — LOSARTAN POTASSIUM-HCTZ 100-12.5 MG PO TABS: 1.0000 | ORAL_TABLET | Freq: Every day | ORAL | Status: DC

## 2016-02-16 NOTE — Patient Instructions (Addendum)
Stop your HCTZ, potassium and amlodipine New blood pressure pill replaces all three. Please check your blood pressure at home regularly with this change to make sure it stays well controled. I would like you to come back for lab only visit in 2-3 weeks.  It does not need to be fasting. I will be happy to refer you to a hand surgeon if that left hand gets worse.

## 2016-02-17 DIAGNOSIS — Z0000 Encounter for general adult medical examination without abnormal findings: Secondary | ICD-10-CM | POA: Insufficient documentation

## 2016-02-17 NOTE — Assessment & Plan Note (Signed)
Switch to ARB, stop potassium,  Recheck one month.

## 2016-02-17 NOTE — Progress Notes (Signed)
   Subjective:    Patient ID: Karen Jackson, female    DOB: 1941-01-04, 75 y.o.   MRN: [id number]  HPI Annual exam: Doing well.  Her only complaint is the residual left sided weakness. By constantly working with PT, she can remain ambulatory.  When she takes a break from PT, she rapidly declines and then needs to fight to regain function.   Good spirits.  Mild pain, has known osteoarthritis.  Issues 1. Diet controled DM.  Needs foot exam, not on ACE/ARB.  Has had eye exam but we do not have copies. 2. HBP well controled on current meds.  Discussed switch to substitute ARB for renal protection.   3. Obesity.  Weight is up a bit.  4. Residual effects of CVA.  CVA was hemmorrhagic so no anticoagulation. 5. Gait instability, no recent falls. 6. HPDP up to date except for the diabetic bundle.     Review of Systems Denies CP, DOE, change in bowel, bladder or skin.  No bleeding.  No headaches.     Objective:   Physical Exam        Assessment & Plan:

## 2016-02-17 NOTE — Assessment & Plan Note (Signed)
Wt loss  

## 2016-02-17 NOTE — Assessment & Plan Note (Signed)
Continue PT to maintain mobility.

## 2016-02-17 NOTE — Assessment & Plan Note (Signed)
Switch to ARB for renal protection  Wt reduction. Get eye exam records.

## 2016-02-18 ENCOUNTER — Telehealth: Payer: Self-pay | Admitting: *Deleted

## 2016-02-18 DIAGNOSIS — I69354 Hemiplegia and hemiparesis following cerebral infarction affecting left non-dominant side: Secondary | ICD-10-CM

## 2016-02-18 NOTE — Telephone Encounter (Signed)
Pt calling in to tell dr Karen Jackson that she does want a referral to occupational therapy off of 23.  Kennon Holter, CMA

## 2016-02-18 NOTE — Telephone Encounter (Signed)
Occ Med referral entered for left hand pain.

## 2016-02-18 NOTE — Telephone Encounter (Signed)
Pt informed. Sharon T Saunders, CMA  

## 2016-03-01 ENCOUNTER — Encounter: Payer: Self-pay | Admitting: Occupational Therapy

## 2016-03-01 ENCOUNTER — Ambulatory Visit: Payer: Medicare Other | Attending: Family Medicine | Admitting: Occupational Therapy

## 2016-03-01 DIAGNOSIS — R270 Ataxia, unspecified: Secondary | ICD-10-CM

## 2016-03-01 DIAGNOSIS — M25532 Pain in left wrist: Secondary | ICD-10-CM | POA: Diagnosis not present

## 2016-03-01 DIAGNOSIS — I69354 Hemiplegia and hemiparesis following cerebral infarction affecting left non-dominant side: Secondary | ICD-10-CM | POA: Diagnosis not present

## 2016-03-01 DIAGNOSIS — M25542 Pain in joints of left hand: Secondary | ICD-10-CM | POA: Diagnosis not present

## 2016-03-01 DIAGNOSIS — R278 Other lack of coordination: Secondary | ICD-10-CM | POA: Diagnosis not present

## 2016-03-01 DIAGNOSIS — R293 Abnormal posture: Secondary | ICD-10-CM | POA: Diagnosis not present

## 2016-03-01 NOTE — Therapy (Signed)
[facility] [address]  [place], [place], [address] Phone: [phone]   Fax:  [phone]  Occupational Therapy Evaluation  Patient Details  Name: Karen Jackson MRN: [id number] Date of Birth: Nov 05, 1940 Referring Provider: Dr. Madison Hickman  Encounter Date: 03/01/2016      OT End of Session - 03/01/16 1324    Visit Number 1   Number of Visits 17   Date for OT Re-Evaluation 05/01/16   Authorization Type MCR - G Code needed   Authorization - Visit Number 1   Authorization - Number of Visits 10   OT Start Time [id number]   OT Stop Time 1100   OT Time Calculation (min) 45 min   Activity Tolerance Patient tolerated treatment well      Past Medical History  Diagnosis Date  . Arthritis   . Hyperlipidemia   . Hypertension   . Osteopenia     Past Surgical History  Procedure Laterality Date  . Ankle surgery    . Joint replacement      total- right partial- left  . Appendectomy    . Cholecystectomy    . Hernia repair      Esophagus  . Mastectomy partial / lumpectomy  2012    left    There were no vitals filed for this visit.      Subjective Assessment - 03/01/16 1013    Pertinent History CVA 2013, OA   Patient Stated Goals To get my hand better and make sure it doesn't fall off the walker    Currently in Pain? Yes   Pain Score 6    Pain Location Hand  AND Thumb   Pain Orientation Left   Pain Descriptors / Indicators Aching   Pain Type Chronic pain   Pain Onset More than a month ago   Pain Frequency Constant   Aggravating Factors  cold, rainy weather   Pain Relieving Factors voltaren gel, meds           [facility] OT Assessment - 03/01/16 0001    Assessment   Diagnosis Lt hand pain and weakness (late effects of CVA)  May also be developing CTS per MD notes on referral.    Referring Provider Dr. Madison Hickman   Onset Date --  CVA June 2013. Pt reports pain has increased this last month   Prior  Therapy HH therapies in 2013   Precautions   Precautions Fall   Required Braces or Orthoses --  Lt AFO   Restrictions   Weight Bearing Restrictions No   Balance Screen   Has the patient fallen in the past 6 months No   Has the patient had a decrease in activity level because of a fear of falling?  Yes   Is the patient reluctant to leave their home because of a fear of falling?  No   Home  Environment   Writer;Door   Adaptive equipment --  shower chair, walker, elevated toilet, grab bars, w/c   Additional Comments Caregivers M-F 7-2, 5-9 (by herself 2-5, and at night), Sat-Sun 7-12, 5-9. Has 2 nieces close by that also check in on pt. Pt lives in 1 story home with ramp to enter   Lives With Alone   Prior Function   Level of Independence Needs assistance with ADLs;Independent with community mobility with device   Vocation Retired   Leisure reading   ADL   Eating/Feeding Modified independent  assist  to cut food   Grooming Modified independent  needs assist for styling hair   Upper Body Bathing Supervision/safety   Lower Body Bathing Supervision/safety  needs assist to dry Lt lower leg   Upper Body Dressing Independent   Lower Body Dressing Minimal assistance  needs assist with donning brace and Lt sock/shoe   Toilet Tranfer Modified independent  with grab bar   Toileting - Clothing Manipulation Modified independent  but difficulty donning Lt side up    [place] Transfer Supervision/safety  uses w/c to get out, grab bar to get in   IADL   Shopping Completely unable to shop   Meal Prep Needs to have meals prepared and served  from caregiver   Medication Management Is responsible for taking medication in correct dosages at correct time   Physiological scientist financial matters independently (budgets, writes checks, pays rent, bills goes to bank), collects and keeps track of income   Mobility   Mobility  Status Comments uses walker primarily, uses w/c at night to use bathroom   Written Expression   Dominant Hand Right   Handwriting --  no changes   Vision - History   Baseline Vision Wears glasses for distance only   Visual History Cataracts  cataract surgery   Additional Comments denies change   Cognition   Overall Cognitive Status Within Functional Limits for tasks assessed   Observation/Other Assessments   Observations Noted laxity of joints in Lt hand at wrist, thumb CMC and MP, and all digit MP joints. Unable to fully assess CTS with Phalens test d/t numbness entire LUE from CVA in 2013.    Sensation   Light Touch Impaired by gross assessment   Additional Comments Pt unable to consistently id touch and completely unable to localize stimulus LUE   Coordination   9 Hole Peg Test Right;Left   Right 9 Hole Peg Test placed 3 pegs in 2 minutes d/t ataxia/dyskinetic movement   Box and Blocks 19   Coordination severe ataxia with fine motor coordination tasks Lt hand   Edema   Edema only joint swelling from OA   ROM / Strength   AROM / PROM / Strength AROM;Strength   AROM   Overall AROM Comments RUE WNL's. LUE shoulder flex limited to 108*, ER/IR, and abduction difficulty. Elbow distally WFL's but noted dyskinesia in hand from CVA   Strength   Overall Strength Comments RUE 4/5 grossly, LUE 3+/5 grossly   Hand Function   Right Hand Grip (lbs) 33 LBS   Left Hand Grip (lbs) 16 LBS                           OT Short Term Goals - 03/01/16 1328    OT SHORT TERM GOAL #1   Title Independent with HEP for coordination and wrist/grip strengthening LUE (All STG's due 04/01/16)    Time 4   Period Weeks   Status New   OT SHORT TERM GOAL #2   Title Independent with splint wear and care prn    Time 4   Period Weeks   Status New   OT SHORT TERM GOAL #3   Title Pt to verbalize understanding with joint protection techniques and compensatory strategies and A/E needs to  increase ease/independence with ADLS and decrease pain   Time 4   Period Weeks   Status New  OT Long Term Goals - 03/01/16 1332    OT LONG TERM GOAL #1   Title Independent with updated HEP PRN (All LTG's due 05/01/16)    Time 8   Period Weeks   Status New   OT LONG TERM GOAL #2   Title Pt to cut food Mod I level with A/E PRN   Time 8   Period Weeks   Status New   OT LONG TERM GOAL #3   Title Improve coordination as evidenced by improving LUE on Box & Blocks to 25 or greater    Baseline EVAL = 19   Time 8   Period Weeks   Status New   OT LONG TERM GOAL #4   Title Pt to increase grip strength to 25 lbs Lt hand to assist with opening containers    Baseline eval = 16 lbs (Rt = 33 lbs)    Time 8   Period Weeks   Status New   OT LONG TERM GOAL #5   Title Pt/caregiver to verbalize safety techniques for decreased sensation LUE and to report overall increased safety with Lt hand placement on walker during ambulation   Time 8   Period Weeks   Status New               Plan - 03/01/16 1324    Clinical Impression Statement Pt is a 75 y.o. female who presents to outpatient rehab with worsening pain in Lt hand/wrist and thumb within the last month as late effects of CVA, and worsening OA. Pt reports main problem is hand falling off walker. PMH: CVA 2013, OA, bilateral knee surgery and Rt ankle surgery   Rehab Potential Fair   Clinical Impairments Affecting Rehab Potential time since onset, OA   OT Frequency 2x / week   OT Duration 8 weeks  PLUS EVALUATION   OT Treatment/Interventions Self-care/ADL training;Therapeutic exercise;Functional Mobility Training;Patient/family education;Neuromuscular education;Splinting;Energy conservation;Parrafin;DME and/or AE instruction;Therapeutic activities;Fluidtherapy;Moist Heat;Passive range of motion   Plan [place], initiate HEP, issue walker splint, will also make splint at neuro center location for wrist and thumb support    Consulted and Agree with Plan of Care Patient      Patient will benefit from skilled therapeutic intervention in order to improve the following deficits and impairments:  Decreased coordination, Decreased range of motion, Difficulty walking, Impaired flexibility, Improper body mechanics, Decreased endurance, Impaired sensation, Decreased knowledge of precautions, Decreased activity tolerance, Pain, Impaired UE functional use, Decreased knowledge of use of DME, Decreased balance, Decreased strength  Visit Diagnosis: Ataxia, unspecified - Plan: Ot plan of care cert/re-cert  Pain in left wrist - Plan: Ot plan of care cert/re-cert  Pain in joints of left hand - Plan: Ot plan of care cert/re-cert  Hemiplegia and hemiparesis following cerebral infarction affecting left non-dominant side ([place]) - Plan: Ot plan of care cert/re-cert  Abnormal posture - Plan: Ot plan of care cert/re-cert  Other lack of coordination - Plan: Ot plan of care cert/re-cert      G-Codes - [id number]    Functional Assessment Tool Used LUE: grip 16 lbs, Box & Blocks 19, 9 hole peg - unable to complete   Functional Limitation Carrying, moving and handling objects   Carrying, Moving and Handling Objects Current Status [id number]) At least 60 percent but less than 80 percent impaired, limited or restricted   Carrying, Moving and Handling Objects Goal Status [id number]) At least 40 percent but less than 60 percent impaired, limited or restricted  Problem List Patient Active Problem List   Diagnosis Date Noted  . Encounter for preventive health examination 02/17/2016  . Morbid obesity ([place]) 06/17/2015  . Fall at home 12/01/2014  . Hypomagnesemia 04/24/2014  . Hemiparesis affecting left side as late effect of cerebrovascular accident ([place]) 04/24/2014  . Nontraumatic cerebral hemorrhage ([place]) 04/30/2012  . Diabetes type 2, controlled ([place]) 03/04/2010  . OSTEOPENIA 01/21/2009  . UNSPECIFIED VITAMIN D DEFICIENCY  11/19/2007  . ESSENTIAL HYPERTENSION, BENIGN 11/19/2007  . HYPERCHOLESTEROLEMIA 10/25/2006  . GASTROESOPHAGEAL REFLUX, NO ESOPHAGITIS 10/25/2006  . DIVERTICULOSIS OF COLON 10/25/2006  . Osteoarthritis 10/25/2006  . CERVICAL SPINE DISORDER, NOS 10/25/2006    Carey Bullocks, OTR/L 03/01/2016, 1:37 PM  [facility] [address]  [place], [place], [address] Phone: [phone]   Fax:  [phone]  Name: Karen Jackson MRN: [id number] Date of Birth: 27-Jan-1941

## 2016-03-02 ENCOUNTER — Ambulatory Visit: Payer: Medicare Other | Admitting: Occupational Therapy

## 2016-03-02 ENCOUNTER — Encounter: Payer: Self-pay | Admitting: Occupational Therapy

## 2016-03-02 DIAGNOSIS — M25542 Pain in joints of left hand: Secondary | ICD-10-CM

## 2016-03-02 DIAGNOSIS — R270 Ataxia, unspecified: Secondary | ICD-10-CM | POA: Diagnosis not present

## 2016-03-02 DIAGNOSIS — R293 Abnormal posture: Secondary | ICD-10-CM | POA: Diagnosis not present

## 2016-03-02 DIAGNOSIS — I69354 Hemiplegia and hemiparesis following cerebral infarction affecting left non-dominant side: Secondary | ICD-10-CM | POA: Diagnosis not present

## 2016-03-02 DIAGNOSIS — M25532 Pain in left wrist: Secondary | ICD-10-CM

## 2016-03-02 DIAGNOSIS — R278 Other lack of coordination: Secondary | ICD-10-CM | POA: Diagnosis not present

## 2016-03-02 NOTE — Patient Instructions (Signed)
SPLINT WEAR AND CARE  Your Splint This splint should initially be fitted by a healthcare practitioner.  The healthcare practitioner is responsible for providing wearing instructions and precautions to the patient, other healthcare practitioners and care provider involved in the patient's care.  This splint was custom made for you. Please read the following instructions to learn about wearing and caring for your splint.  Precautions Should your splint cause any of the following problems, remove the splint immediately and contact your therapist/physician.  Swelling  Severe Pain  Pressure Areas - redness should fade after 10-20 minutes of removing splint  Stiffness  Numbness  Do not wear your splint while operating machinery unless it has been fabricated for that purpose.  When To Wear Your Splint Where your splint according to your therapist/physician instructions. Daytime for 2-3 hours at a time. Do not wear at night or when up and walking  Care and Cleaning of Your Splint 1. Keep your splint away from open flames. 2. Your splint will lose its shape in temperatures over 135 degrees Farenheit, ( in car windows, near radiators, ovens or in hot water).  Never make any adjustments to your splint, if the splint needs adjusting remove it and make an appointment to see your therapist. 3. Your splint may be cleaned with rubbing alcohol.  Do not immerse in hot water over 135 degrees Farenheit. 4. Straps may be washed with soap and water, but do not moisten the self-adhesive portion. 5. For ink or hard to remove spots use a scouring cleanser which contains chlorine.  Rinse the splint thoroughly after using chlorine cleanser  Get caregiver to help don/doff splint!! Monitor for pressure sores on skin

## 2016-03-02 NOTE — Therapy (Signed)
[facility] [address] [place], [place], [address] Phone: [phone]   Fax:  [phone]  Occupational Therapy Treatment  Patient Details  Name: Karen Jackson MRN: [id number] Date of Birth: 1940-11-25 Referring Provider: Dr. Madison Hickman  Encounter Date: 03/02/2016      OT End of Session - 03/02/16 1151    Visit Number 2   Number of Visits 17   Date for OT Re-Evaluation 05/01/16   Authorization Type MCR - G Code needed   Authorization - Visit Number 2   Authorization - Number of Visits 10   OT Start Time [id number]   OT Stop Time 1138   OT Time Calculation (min) 83 min   Equipment Utilized During Treatment splints   Activity Tolerance Patient tolerated treatment well      Past Medical History  Diagnosis Date  . Arthritis   . Hyperlipidemia   . Hypertension   . Osteopenia     Past Surgical History  Procedure Laterality Date  . Ankle surgery    . Joint replacement      total- right partial- left  . Appendectomy    . Cholecystectomy    . Hernia repair      Esophagus  . Mastectomy partial / lumpectomy  2012    left    There were no vitals filed for this visit.      Subjective Assessment - 03/02/16 1123    Subjective  The splint feels good   Pertinent History CVA 2013, OA   Patient Stated Goals To get my hand better and make sure it doesn't fall off the walker    Currently in Pain? Yes   Pain Score 5    Pain Location Hand  and thumb   Pain Orientation Left   Pain Descriptors / Indicators Aching   Pain Type Chronic pain   Pain Onset More than a month ago   Pain Frequency Constant   Aggravating Factors  cold, rainy wether   Pain Relieving Factors voltaren gel, meds                      OT Treatments/Exercises (OP) - 03/02/16 0001    ADLs   ADL Comments Therapist observed pt's walker at inappropriate height for patient (too high) and recommended obtaining a youth walker.  Therapist filled out referral for pt to take to MD to sign. Caregiver also instructed in how to properly don/doff walker splint issued today for Lt hand, and how to properly don/doff thumb spica splint fabricated today while maintaining thumb MP joint integrity. Reviewed signs of pressure sores and instructed both pt and caregiver to monitor closely due to pt's skin integrity and abscent sensation. Pt/caregiver also instructed in wearing time (only when caregivers there, and not at night) and how to clean splint   Splinting   Splinting Pt issued walker splint for Lt hand for safety d/t pt's hand slipping off walker normally. Pt/caregiver shown how to adjust angle prn. Fabricated and fitted thumb spica style splint with thumb support at MP joint and for wrist support while allowing some function of the hand during the day while seated. Pt/caregiver instructed NOT to wear when up and ambulating secondary to requiring walker splint for safety. Issued thumb spica splint. Also provided new staps and assessed fitting of previously fabricated splint in 2013. Splint still fits extremely well and supports thumb; pt instructed she can don/doff this one I'ly secondary to being volar and  easier to don. (Fabricated new one to allow slightly more function to grasp/release objects)                OT Education - 03/02/16 1127    Education provided Yes   Education Details splint wear and care (caregiver practiced donning/doffing correctly for thumb MP joint protection)    Person(s) Educated Patient;Caregiver(s)   Methods Explanation;Demonstration;Handout   Comprehension Verbalized understanding;Returned demonstration          OT Short Term Goals - 03/01/16 1328    OT [place] #1   Title Independent with HEP for coordination and wrist/grip strengthening LUE (All STG's due 04/01/16)    Time 4   Period Weeks   Status New   OT SHORT TERM GOAL #2   Title Independent with splint wear and care prn     Time 4   Period Weeks   Status New   OT SHORT TERM GOAL #3   Title Pt to verbalize understanding with joint protection techniques and compensatory strategies and A/E needs to increase ease/independence with ADLS and decrease pain   Time 4   Period Weeks   Status New           OT Long Term Goals - 03/01/16 1332    OT LONG TERM GOAL #1   Title Independent with updated HEP PRN (All LTG's due 05/01/16)    Time 8   Period Weeks   Status New   OT LONG TERM GOAL #2   Title Pt to cut food Mod I level with A/E PRN   Time 8   Period Weeks   Status New   OT LONG TERM GOAL #3   Title Improve coordination as evidenced by improving LUE on Box & Blocks to 25 or greater    Baseline EVAL = 19   Time 8   Period Weeks   Status New   OT LONG TERM GOAL #4   Title Pt to increase grip strength to 25 lbs Lt hand to assist with opening containers    Baseline eval = 16 lbs (Rt = 33 lbs)    Time 8   Period Weeks   Status New   OT LONG TERM GOAL #5   Title Pt/caregiver to verbalize safety techniques for decreased sensation LUE and to report overall increased safety with Lt hand placement on walker during ambulation   Time 8   Period Weeks   Status New               Plan - 03/02/16 1152    Clinical Impression Statement Pt/caregiver verbalized understanding with montioring pressure sores, and splint wear and care today. Will assess further prn   Rehab Potential Fair   Clinical Impairments Affecting Rehab Potential time since onset, OA   OT Frequency 2x / week   OT Duration 8 weeks   OT Treatment/Interventions Self-care/ADL training;Therapeutic exercise;Functional Mobility Training;Patient/family education;Neuromuscular education;Splinting;Energy conservation;Parrafin;DME and/or AE instruction;Therapeutic activities;Fluidtherapy;Moist Heat;Passive range of motion   Plan paraffin, initiate HEP for coordination and ROM LUE   Consulted and Agree with Plan of Care Patient      Patient will  benefit from skilled therapeutic intervention in order to improve the following deficits and impairments:  Decreased coordination, Decreased range of motion, Difficulty walking, Impaired flexibility, Improper body mechanics, Decreased endurance, Impaired sensation, Decreased knowledge of precautions, Decreased activity tolerance, Pain, Impaired UE functional use, Decreased knowledge of use of DME, Decreased balance, Decreased strength  Visit Diagnosis: Pain in  left wrist  Pain in joints of left hand      G-Codes - 2016/03/21 1335    Functional Assessment Tool Used LUE: grip 16 lbs, Box & Blocks 19, 9 hole peg - unable to complete   Functional Limitation Carrying, moving and handling objects   Carrying, Moving and Handling Objects Current Status [id number]) At least 60 percent but less than 80 percent impaired, limited or restricted   Carrying, Moving and Handling Objects Goal Status [id number]) At least 40 percent but less than 60 percent impaired, limited or restricted      Problem List Patient Active Problem List   Diagnosis Date Noted  . Encounter for preventive health examination 02/17/2016  . Morbid obesity ([place]) 06/17/2015  . Fall at home 12/01/2014  . Hypomagnesemia 04/24/2014  . Hemiparesis affecting left side as late effect of cerebrovascular accident ([place]) 04/24/2014  . Nontraumatic cerebral hemorrhage ([place]) 04/30/2012  . Diabetes type 2, controlled ([place]) 03/04/2010  . OSTEOPENIA 01/21/2009  . UNSPECIFIED VITAMIN D DEFICIENCY 11/19/2007  . ESSENTIAL HYPERTENSION, BENIGN 11/19/2007  . HYPERCHOLESTEROLEMIA 10/25/2006  . GASTROESOPHAGEAL REFLUX, NO ESOPHAGITIS 10/25/2006  . DIVERTICULOSIS OF COLON 10/25/2006  . Osteoarthritis 10/25/2006  . CERVICAL SPINE DISORDER, NOS 10/25/2006    Carey Bullocks, OTR/L 03/02/2016, 11:55 AM  [facility] [address] [place], [place], [address] Phone: [phone]   Fax:   [phone]  Name: Karen Jackson MRN: [id number] Date of Birth: [birth year]

## 2016-03-06 ENCOUNTER — Encounter: Payer: Self-pay | Admitting: Occupational Therapy

## 2016-03-06 ENCOUNTER — Ambulatory Visit: Payer: Medicare Other | Admitting: Occupational Therapy

## 2016-03-06 DIAGNOSIS — M25532 Pain in left wrist: Secondary | ICD-10-CM | POA: Diagnosis not present

## 2016-03-06 DIAGNOSIS — I69354 Hemiplegia and hemiparesis following cerebral infarction affecting left non-dominant side: Secondary | ICD-10-CM | POA: Diagnosis not present

## 2016-03-06 DIAGNOSIS — R278 Other lack of coordination: Secondary | ICD-10-CM

## 2016-03-06 DIAGNOSIS — M25542 Pain in joints of left hand: Secondary | ICD-10-CM | POA: Diagnosis not present

## 2016-03-06 DIAGNOSIS — R270 Ataxia, unspecified: Secondary | ICD-10-CM | POA: Diagnosis not present

## 2016-03-06 DIAGNOSIS — R293 Abnormal posture: Secondary | ICD-10-CM | POA: Diagnosis not present

## 2016-03-06 NOTE — Therapy (Signed)
[facility] [address]  [place], [place], [address] Phone: [phone]   Fax:  [phone]  Occupational Therapy Treatment  Patient Details  Name: Karen Jackson MRN: [id number] Date of Birth: 01/27/1941 Referring Provider: Dr. Madison Hickman  Encounter Date: 03/06/2016      OT End of Session - 03/06/16 1312    Visit Number 3   Number of Visits 17   Date for OT Re-Evaluation 05/01/16   Authorization Type MCR - G Code needed   Authorization - Visit Number 3   Authorization - Number of Visits 10   OT Start Time 1100   OT Stop Time 1145   OT Time Calculation (min) 45 min   Activity Tolerance Patient tolerated treatment well      Past Medical History  Diagnosis Date  . Arthritis   . Hyperlipidemia   . Hypertension   . Osteopenia     Past Surgical History  Procedure Laterality Date  . Ankle surgery    . Joint replacement      total- right partial- left  . Appendectomy    . Cholecystectomy    . Hernia repair      Esophagus  . Mastectomy partial / lumpectomy  2012    left    There were no vitals filed for this visit.      Subjective Assessment - 03/06/16 1116    Subjective  The walker splint hurt my thumb so I took it off   Pertinent History CVA 2013, OA   Patient Stated Goals To get my hand better and make sure it doesn't fall off the walker    Currently in Pain? Yes   Pain Score 5    Pain Location --  THUMB   Pain Orientation Left   Pain Descriptors / Indicators Aching   Pain Type Chronic pain   Pain Onset More than a month ago   Pain Frequency Constant   Aggravating Factors  cold, rainy weather   Pain Relieving Factors voltaren gel, meds                      OT Treatments/Exercises (OP) - 03/06/16 0001    ADLs   ADL Comments Therapist assessed walker splint - pt reports pain at 1 small area where there is no cushioned liner. Pt instructed to add the extra paddding that  therapist provided last session to this area and see if that helps - pt agrees.    Exercises   Exercises Hand   Hand Exercises   Other Hand Exercises see pt instructions - pt demo putty ex (therapist issued yellow putty) and thumb ex   Fine Motor Coordination   Other Fine Motor Exercises see pt instructions - pt demo each fine motor activity                OT Education - 03/06/16 1141    Education provided Yes   Education Details coordination, putty,  and thumb HEP    Person(s) Educated Patient;Caregiver(s)   Methods Explanation;Demonstration;Handout   Comprehension Verbalized understanding;Returned demonstration          OT Short Term Goals - 03/01/16 1328    OT SHORT TERM GOAL #1   Title Independent with HEP for coordination and wrist/grip strengthening LUE (All STG's due 04/01/16)    Time 4   Period Weeks   Status New   OT SHORT TERM GOAL #2   Title Independent  with splint wear and care prn    Time 4   Period Weeks   Status New   OT SHORT TERM GOAL #3   Title Pt to verbalize understanding with joint protection techniques and compensatory strategies and A/E needs to increase ease/independence with ADLS and decrease pain   Time 4   Period Weeks   Status New           OT Long Term Goals - 03/01/16 1332    OT LONG TERM GOAL #1   Title Independent with updated HEP PRN (All LTG's due 05/01/16)    Time 8   Period Weeks   Status New   OT LONG TERM GOAL #2   Title Pt to cut food Mod I level with A/E PRN   Time 8   Period Weeks   Status New   OT LONG TERM GOAL #3   Title Improve coordination as evidenced by improving LUE on Box & Blocks to 25 or greater    Baseline EVAL = 19   Time 8   Period Weeks   Status New   OT LONG TERM GOAL #4   Title Pt to increase grip strength to 25 lbs Lt hand to assist with opening containers    Baseline eval = 16 lbs (Rt = 33 lbs)    Time 8   Period Weeks   Status New   OT LONG TERM GOAL #5   Title Pt/caregiver to verbalize  safety techniques for decreased sensation LUE and to report overall increased safety with Lt hand placement on walker during ambulation   Time 8   Period Weeks   Status New               Plan - 03/06/16 1313    Clinical Impression Statement Pt approximating STG's. Pt reports she has been wearing initial splint issued in 2013, but has not had a chance to wear new splint secondary to no caregiver over the weekend.    Rehab Potential Fair   Clinical Impairments Affecting Rehab Potential time since onset, OA   OT Frequency 2x / week   OT Duration 8 weeks   OT Treatment/Interventions Self-care/ADL training;Therapeutic exercise;Functional Mobility Training;Patient/family education;Neuromuscular education;Splinting;Energy conservation;Parrafin;DME and/or AE instruction;Therapeutic activities;Fluidtherapy;Moist Heat;Passive range of motion   Plan review HEP, issue LUE ROM HEP, issue and review arthritis booklet   Consulted and Agree with Plan of Care Patient;Family member/caregiver   Family Member Consulted caregiver      Patient will benefit from skilled therapeutic intervention in order to improve the following deficits and impairments:  Decreased coordination, Decreased range of motion, Difficulty walking, Impaired flexibility, Improper body mechanics, Decreased endurance, Impaired sensation, Decreased knowledge of precautions, Decreased activity tolerance, Pain, Impaired UE functional use, Decreased knowledge of use of DME, Decreased balance, Decreased strength  Visit Diagnosis: Pain in joints of left hand  Ataxia, unspecified  Hemiplegia and hemiparesis following cerebral infarction affecting left non-dominant side (HCC)  Other lack of coordination    Problem List Patient Active Problem List   Diagnosis Date Noted  . Encounter for preventive health examination 02/17/2016  . Morbid obesity ([place]) 06/17/2015  . Fall at home 12/01/2014  . Hypomagnesemia 04/24/2014  .  Hemiparesis affecting left side as late effect of cerebrovascular accident ([place]) 04/24/2014  . Nontraumatic cerebral hemorrhage ([place]) 04/30/2012  . Diabetes type 2, controlled ([place]) 03/04/2010  . OSTEOPENIA 01/21/2009  . UNSPECIFIED VITAMIN D DEFICIENCY 11/19/2007  . ESSENTIAL HYPERTENSION, BENIGN 11/19/2007  . HYPERCHOLESTEROLEMIA 10/25/2006  .  GASTROESOPHAGEAL REFLUX, NO ESOPHAGITIS 10/25/2006  . DIVERTICULOSIS OF COLON 10/25/2006  . Osteoarthritis 10/25/2006  . CERVICAL SPINE DISORDER, NOS 10/25/2006    Carey Bullocks, OTR/L 03/06/2016, 1:14 PM  [facility] [address]  [place], [place], [address] Phone: [phone]   Fax:  [phone]  Name: ALEZA ABRAMCZYK MRN: [id number] Date of Birth: 26-Oct-1940

## 2016-03-06 NOTE — Patient Instructions (Signed)
1. Grip Strengthening (Resistive Putty)   Squeeze putty using all fingers but keep thumb relaxed. Repeat _10___ times. Do __2__ sessions per day.    Opposition (Active)    Touch tip of thumb to nail tip of SMALL finger in turn, making an "O" shape. Then relax thumb Repeat __10__ times. Do __2__ sessions per day. Try to wear splint for this   Coordination Activities  Perform the following activities for 10 minutes 3-6 times per day with left hand(s). Try and wear splint for these ex's   Flip cards 1 at a time as fast as you can.  Deal cards with your Lt hand.   Pick up coins, buttons, marbles, dried beans/pasta, checkers of different sizes and place in container.  Pick up coins and place in container (narrow cup) or coin bank.  Pick up and stack checkers. Pick up and stack cups

## 2016-03-08 ENCOUNTER — Encounter: Payer: Self-pay | Admitting: Occupational Therapy

## 2016-03-08 ENCOUNTER — Ambulatory Visit: Payer: Medicare Other | Admitting: Occupational Therapy

## 2016-03-08 DIAGNOSIS — R278 Other lack of coordination: Secondary | ICD-10-CM | POA: Diagnosis not present

## 2016-03-08 DIAGNOSIS — M25542 Pain in joints of left hand: Secondary | ICD-10-CM | POA: Diagnosis not present

## 2016-03-08 DIAGNOSIS — I69354 Hemiplegia and hemiparesis following cerebral infarction affecting left non-dominant side: Secondary | ICD-10-CM

## 2016-03-08 DIAGNOSIS — M25532 Pain in left wrist: Secondary | ICD-10-CM

## 2016-03-08 DIAGNOSIS — R270 Ataxia, unspecified: Secondary | ICD-10-CM

## 2016-03-08 DIAGNOSIS — R293 Abnormal posture: Secondary | ICD-10-CM | POA: Diagnosis not present

## 2016-03-08 NOTE — Therapy (Signed)
[facility] [address]  [place], [place], [address] Phone: [phone]   Fax:  [phone]  Occupational Therapy Treatment  Patient Details  Name: Karen Jackson MRN: [id number] Date of Birth: 1940/10/20 Referring Provider: Dr. Madison Hickman  Encounter Date: 03/08/2016      OT End of Session - 03/08/16 1358    Visit Number 4   Number of Visits 17   Date for OT Re-Evaluation 05/01/16   Authorization Type MCR - G Code needed   Authorization - Visit Number 4   Authorization - Number of Visits 10   OT Start Time 1100   OT Stop Time 1145   OT Time Calculation (min) 45 min   Activity Tolerance Patient tolerated treatment well      Past Medical History  Diagnosis Date  . Arthritis   . Hyperlipidemia   . Hypertension   . Osteopenia     Past Surgical History  Procedure Laterality Date  . Ankle surgery    . Joint replacement      total- right partial- left  . Appendectomy    . Cholecystectomy    . Hernia repair      Esophagus  . Mastectomy partial / lumpectomy  2012    left    There were no vitals filed for this visit.      Subjective Assessment - 03/08/16 1059    Subjective  The splint is helping me. I'm wearing the old one more now. My walker splint also feels better now   Pertinent History CVA 2013, OA   Patient Stated Goals To get my hand better and make sure it doesn't fall off the walker    Currently in Pain? Yes   Pain Score 3    Pain Location --  thumb   Pain Orientation Left   Pain Descriptors / Indicators Aching   Pain Type Chronic pain   Pain Onset More than a month ago   Pain Frequency Constant   Aggravating Factors  cold, rainy weather   Pain Relieving Factors voltaren gel, meds                      OT Treatments/Exercises (OP) - 03/08/16 0001    ADLs   ADL Comments Pt issued arthritis booklet and reviewed joint protection strategies and compensatory strategies  to hold items and perform functional tasks for joint protection (specifically for Lt wrist and thumb). Also encouraged pt to use Lt hand to assist for light functional tasks while seated only (wearing splint prn), and to avoid anything hot, heavy, sharp, or breakable. Caregiver also present for education. Also reviewed proper donning/doffing of thumb spica splint with patient/caregiver   Hand Exercises   Other Hand Exercises Reviewed putty, thumb, and coordination HEP - Pt return demo of each   Functional Reaching Activities   Mid Level Pt reaching to placing checkers into Connect 4 slots LUE for coordination, ROM, and repetitive functional task. Pt stacking cones with Lt hand with cues for positioning and to avoid Lt thumb hyperextension                OT Education - 03/08/16 1352    Education provided Yes   Education Details review of HEP, Joint protection strategies and compensatory strategies prn, safety techniques   Person(s) Educated Patient;Caregiver(s)   Methods Explanation;Demonstration;Handout   Comprehension Verbalized understanding;Returned demonstration          OT Short  Term Goals - 03/08/16 1354    OT SHORT TERM GOAL #1   Title Independent with HEP for coordination and wrist/grip strengthening LUE (All STG's due 04/01/16)    Time 4   Period Weeks   Status On-going   OT SHORT TERM GOAL #2   Title Independent with splint wear and care prn    Time 4   Period Weeks   Status On-going   OT SHORT TERM GOAL #3   Title Pt to verbalize understanding with joint protection techniques and compensatory strategies and A/E needs to increase ease/independence with ADLS and decrease pain   Time 4   Period Weeks   Status On-going           OT Long Term Goals - 03/01/16 1332    OT LONG TERM GOAL #1   Title Independent with updated HEP PRN (All LTG's due 05/01/16)    Time 8   Period Weeks   Status New   OT LONG TERM GOAL #2   Title Pt to cut food Mod I level with A/E PRN    Time 8   Period Weeks   Status New   OT LONG TERM GOAL #3   Title Improve coordination as evidenced by improving LUE on Box & Blocks to 25 or greater    Baseline EVAL = 19   Time 8   Period Weeks   Status New   OT LONG TERM GOAL #4   Title Pt to increase grip strength to 25 lbs Lt hand to assist with opening containers    Baseline eval = 16 lbs (Rt = 33 lbs)    Time 8   Period Weeks   Status New   OT LONG TERM GOAL #5   Title Pt/caregiver to verbalize safety techniques for decreased sensation LUE and to report overall increased safety with Lt hand placement on walker during ambulation   Time 8   Period Weeks   Status New               Plan - 03/08/16 1354    Clinical Impression Statement Pt approximating all STG's. Pt/caregiver with increased understanding of how to use LUE into SAFE functional tasks.    Rehab Potential Fair   Clinical Impairments Affecting Rehab Potential time since onset, OA   OT Frequency 2x / week   OT Duration 8 weeks   OT Treatment/Interventions Self-care/ADL training;Therapeutic exercise;Functional Mobility Training;Patient/family education;Neuromuscular education;Splinting;Energy conservation;Parrafin;DME and/or AE instruction;Therapeutic activities;Fluidtherapy;Moist Heat;Passive range of motion   Plan HEP for LUE (shoulder) and posture, practice placing large pegs Lt hand   Consulted and Agree with Plan of Care Patient;Family member/caregiver   Family Member Consulted caregiver      Patient will benefit from skilled therapeutic intervention in order to improve the following deficits and impairments:  Decreased coordination, Decreased range of motion, Difficulty walking, Impaired flexibility, Improper body mechanics, Decreased endurance, Impaired sensation, Decreased knowledge of precautions, Decreased activity tolerance, Pain, Impaired UE functional use, Decreased knowledge of use of DME, Decreased balance, Decreased strength  Visit  Diagnosis: Pain in joints of left hand  Ataxia, unspecified  Hemiplegia and hemiparesis following cerebral infarction affecting left non-dominant side (HCC)  Other lack of coordination  Pain in left wrist    Problem List Patient Active Problem List   Diagnosis Date Noted  . Encounter for preventive health examination 02/17/2016  . Morbid obesity ([place]) 06/17/2015  . Fall at home 12/01/2014  . Hypomagnesemia 04/24/2014  . Hemiparesis affecting left  side as late effect of cerebrovascular accident ([place]) 04/24/2014  . Nontraumatic cerebral hemorrhage ([place]) 04/30/2012  . Diabetes type 2, controlled ([place]) 03/04/2010  . OSTEOPENIA 01/21/2009  . UNSPECIFIED VITAMIN D DEFICIENCY 11/19/2007  . ESSENTIAL HYPERTENSION, BENIGN 11/19/2007  . HYPERCHOLESTEROLEMIA 10/25/2006  . GASTROESOPHAGEAL REFLUX, NO ESOPHAGITIS 10/25/2006  . DIVERTICULOSIS OF COLON 10/25/2006  . Osteoarthritis 10/25/2006  . CERVICAL SPINE DISORDER, NOS 10/25/2006    Carey Bullocks, OTR/L 03/08/2016, 1:58 PM  [facility] [address]  [place], [place], [address] Phone: [phone]   Fax:  [phone]  Name: LAVANDA TULLOCH MRN: [id number] Date of Birth: 11-25-1940

## 2016-03-14 ENCOUNTER — Other Ambulatory Visit: Payer: Medicare Other

## 2016-03-14 DIAGNOSIS — I10 Essential (primary) hypertension: Secondary | ICD-10-CM

## 2016-03-14 DIAGNOSIS — E1121 Type 2 diabetes mellitus with diabetic nephropathy: Secondary | ICD-10-CM | POA: Diagnosis not present

## 2016-03-14 LAB — BASIC METABOLIC PANEL WITH GFR
BUN: 15 mg/dL (ref 7–25)
CO2: 28 mmol/L (ref 20–31)
CREATININE: 0.64 mg/dL (ref 0.60–0.93)
Calcium: 9.7 mg/dL (ref 8.6–10.4)
Chloride: 98 mmol/L (ref 98–110)
GFR, Est Non African American: 88 mL/min (ref >=60)
Glucose, Bld: 134 mg/dL — ABNORMAL HIGH (ref 65–99)
Potassium: 4.5 mmol/L (ref 3.5–5.3)
Sodium: 138 mmol/L (ref 135–146)

## 2016-03-20 ENCOUNTER — Encounter: Payer: Self-pay | Admitting: Occupational Therapy

## 2016-03-20 ENCOUNTER — Ambulatory Visit: Payer: Medicare Other | Admitting: Occupational Therapy

## 2016-03-20 DIAGNOSIS — I69354 Hemiplegia and hemiparesis following cerebral infarction affecting left non-dominant side: Secondary | ICD-10-CM | POA: Diagnosis not present

## 2016-03-20 DIAGNOSIS — M25532 Pain in left wrist: Secondary | ICD-10-CM | POA: Diagnosis not present

## 2016-03-20 DIAGNOSIS — R293 Abnormal posture: Secondary | ICD-10-CM | POA: Diagnosis not present

## 2016-03-20 DIAGNOSIS — M25542 Pain in joints of left hand: Secondary | ICD-10-CM | POA: Diagnosis not present

## 2016-03-20 DIAGNOSIS — R278 Other lack of coordination: Secondary | ICD-10-CM | POA: Diagnosis not present

## 2016-03-20 DIAGNOSIS — R270 Ataxia, unspecified: Secondary | ICD-10-CM | POA: Diagnosis not present

## 2016-03-20 NOTE — Patient Instructions (Signed)
SHOULDER: Flexion - Supine (Cane)    Hold cane in both hands. Raise arms up overhead. Do not allow back to arch. Hold _3__ seconds. _10__ reps per set, _2__ sets per day   Abduction (Eccentric) - Active (Cane)    Lying down: Lift cane out to side with Lt affected arm (thumb side up). Avoid hiking shoulder. Keep palm relaxed. Slowly lower affected arm for 3-5 seconds. _10__ reps per set, __2_ sets per day    Shoulder (Scapula) Retraction    Pull shoulders back, squeezing shoulder blades together. Repeat __10__ times per session. Do _6___ sessions per day. Position: Seated   PELVIC TILT: Anterior    Start in slumped position. Roll pelvis forward to arch back. _10__ reps per set, __6_ sets per day   Copyright  VHI. All rights reserved.

## 2016-03-20 NOTE — Therapy (Signed)
[facility] [address]  [place], [place], [address] Phone: [phone]   Fax:  [phone]  Occupational Therapy Treatment  Patient Details  Name: Karen Jackson MRN: [id number] Date of Birth: [birth year] Referring Provider: Dr. Madison Hickman  Encounter Date: 03/20/2016      OT End of Session - 03/20/16 1325    Visit Number 5   Number of Visits 17   Date for OT Re-Evaluation 05/01/16   Authorization Type MCR - G Code needed   Authorization - Visit Number 5   Authorization - Number of Visits 10   OT Start Time 1100   OT Stop Time 1145   OT Time Calculation (min) 45 min   Activity Tolerance Patient tolerated treatment well      Past Medical History:  Diagnosis Date  . Arthritis   . Hyperlipidemia   . Hypertension   . Osteopenia     Past Surgical History:  Procedure Laterality Date  . ANKLE SURGERY    . APPENDECTOMY    . CHOLECYSTECTOMY    . HERNIA REPAIR     Esophagus  . JOINT REPLACEMENT     total- right partial- left  . MASTECTOMY PARTIAL / LUMPECTOMY  2012   left    There were no vitals filed for this visit.      Subjective Assessment - 03/20/16 1107    Pertinent History CVA 2013, OA   Patient Stated Goals To get my hand better and make sure it doesn't fall off the walker    Currently in Pain? Yes   Pain Score 2    Pain Location --  thumb   Pain Orientation Left   Pain Descriptors / Indicators Aching   Pain Type Chronic pain   Pain Onset More than a month ago   Pain Frequency Constant   Aggravating Factors  cold, rainy weather   Pain Relieving Factors meds, joint protection techniques                      OT Treatments/Exercises (OP) - 03/20/16 0001      ADLs   ADL Comments Pt reports she only uses walker splint when out, but it is still uncomfortable for too long. Therapist wrapped Lt side handle of walker with coban today to prevent slipping. Pt/caregiver  instructed to get coban at drugstore if this works well and to change out weekly.      Exercises   Exercises Shoulder     Shoulder Exercises: ROM/Strengthening   Other ROM/Strengthening Exercises see pt instructions for cane exercises supine and postural exercises                OT Education - 03/20/16 1136    Education provided Yes   Education Details shoulder and postural HEP    Person(s) Educated Patient;Caregiver(s)   Methods Explanation;Demonstration;Handout   Comprehension Verbalized understanding;Returned demonstration          OT Short Term Goals - 03/20/16 1326      OT SHORT TERM GOAL #1   Title Independent with HEP for coordination and wrist/grip strengthening LUE (All STG's due 04/01/16)    Time 4   Period Weeks   Status Achieved     OT SHORT TERM GOAL #2   Title Independent with splint wear and care prn    Time 4   Period Weeks   Status Achieved     OT SHORT TERM GOAL #3  Title Pt to verbalize understanding with joint protection techniques and compensatory strategies and A/E needs to increase ease/independence with ADLS and decrease pain   Time 4   Period Weeks   Status Achieved           OT Long Term Goals - 03/01/16 1332      OT LONG TERM GOAL #1   Title Independent with updated HEP PRN (All LTG's due 05/01/16)    Time 8   Period Weeks   Status New     OT LONG TERM GOAL #2   Title Pt to cut food Mod I level with A/E PRN   Time 8   Period Weeks   Status New     OT LONG TERM GOAL #3   Title Improve coordination as evidenced by improving LUE on Box & Blocks to 25 or greater    Baseline EVAL = 19   Time 8   Period Weeks   Status New     OT LONG TERM GOAL #4   Title Pt to increase grip strength to 25 lbs Lt hand to assist with opening containers    Baseline eval = 16 lbs (Rt = 33 lbs)    Time 8   Period Weeks   Status New     OT LONG TERM GOAL #5   Title Pt/caregiver to verbalize safety techniques for decreased sensation LUE and  to report overall increased safety with Lt hand placement on walker during ambulation   Time 8   Period Weeks   Status New               Plan - 03/20/16 1326    Clinical Impression Statement Pt met all STG's. Pt progressing with understanding of joint protection techniques   Rehab Potential Fair   Clinical Impairments Affecting Rehab Potential time since onset, OA   OT Frequency 2x / week   OT Duration 8 weeks   OT Treatment/Interventions Self-care/ADL training;Therapeutic exercise;Functional Mobility Training;Patient/family education;Neuromuscular education;Splinting;Energy conservation;Parrafin;DME and/or AE instruction;Therapeutic activities;Fluidtherapy;Moist Heat;Passive range of motion   Plan review LUE and posture HEP prn, practice placing large pegs with Lt hand   Consulted and Agree with Plan of Care Patient;Family member/caregiver   Family Member Consulted caregiver      Patient will benefit from skilled therapeutic intervention in order to improve the following deficits and impairments:     Visit Diagnosis: Hemiplegia and hemiparesis following cerebral infarction affecting left non-dominant side ([place])  Abnormal posture    Problem List Patient Active Problem List   Diagnosis Date Noted  . Encounter for preventive health examination 02/17/2016  . Morbid obesity ([place]) 06/17/2015  . Fall at home 12/01/2014  . Hypomagnesemia 04/24/2014  . Hemiparesis affecting left side as late effect of cerebrovascular accident ([place]) 04/24/2014  . Nontraumatic cerebral hemorrhage ([place]) 04/30/2012  . Diabetes type 2, controlled ([place]) 03/04/2010  . OSTEOPENIA 01/21/2009  . UNSPECIFIED VITAMIN D DEFICIENCY 11/19/2007  . ESSENTIAL HYPERTENSION, BENIGN 11/19/2007  . HYPERCHOLESTEROLEMIA 10/25/2006  . GASTROESOPHAGEAL REFLUX, NO ESOPHAGITIS 10/25/2006  . DIVERTICULOSIS OF COLON 10/25/2006  . Osteoarthritis 10/25/2006  . CERVICAL SPINE DISORDER, NOS 10/25/2006    Carey Bullocks, OTR/L 03/20/2016, 1:29 PM  [facility] [address]  [place], [place], [address] Phone: [phone]   Fax:  [phone]  Name: MINNETTE MERIDA MRN: [id number] Date of Birth: 1940/12/13

## 2016-03-22 ENCOUNTER — Ambulatory Visit: Payer: Medicare Other | Admitting: Occupational Therapy

## 2016-03-22 ENCOUNTER — Encounter: Payer: Self-pay | Admitting: Occupational Therapy

## 2016-03-22 DIAGNOSIS — R278 Other lack of coordination: Secondary | ICD-10-CM | POA: Diagnosis not present

## 2016-03-22 DIAGNOSIS — R270 Ataxia, unspecified: Secondary | ICD-10-CM | POA: Diagnosis not present

## 2016-03-22 DIAGNOSIS — I69354 Hemiplegia and hemiparesis following cerebral infarction affecting left non-dominant side: Secondary | ICD-10-CM | POA: Diagnosis not present

## 2016-03-22 DIAGNOSIS — M25542 Pain in joints of left hand: Secondary | ICD-10-CM | POA: Diagnosis not present

## 2016-03-22 DIAGNOSIS — R293 Abnormal posture: Secondary | ICD-10-CM

## 2016-03-22 DIAGNOSIS — M25532 Pain in left wrist: Secondary | ICD-10-CM | POA: Diagnosis not present

## 2016-03-22 NOTE — Therapy (Signed)
[facility] [address]  [place], [place], [address] Phone: [phone]   Fax:  [phone]  Occupational Therapy Treatment  Patient Details  Name: Karen Jackson MRN: [id number] Date of Birth: 1940/09/17 Referring Provider: Dr. [place] Hickman  Encounter Date: 03/22/2016      OT End of Session - 03/22/16 1422    Visit Number 6   Number of Visits 17   Date for OT Re-Evaluation 05/01/16   Authorization Type MCR - G Code needed   Authorization - Visit Number 6   Authorization - Number of Visits 10   OT Start Time 0350   OT Stop Time 1145   OT Time Calculation (min) 40 min   Activity Tolerance Patient tolerated treatment well      Past Medical History:  Diagnosis Date  . Arthritis   . Hyperlipidemia   . Hypertension   . Osteopenia     Past Surgical History:  Procedure Laterality Date  . ANKLE SURGERY    . APPENDECTOMY    . CHOLECYSTECTOMY    . HERNIA REPAIR     Esophagus  . JOINT REPLACEMENT     total- right partial- left  . MASTECTOMY PARTIAL / LUMPECTOMY  2012   left    There were no vitals filed for this visit.      Subjective Assessment - 03/22/16 1416    Subjective  My thumb pain is better today, really this week. The coban works better than the walker splint   Pertinent History CVA 2013, OA   Patient Stated Goals To get my hand better and make sure it doesn't fall off the walker    Currently in Pain? Yes   Pain Score 2    Pain Location --  Thumb   Pain Orientation Left   Pain Descriptors / Indicators Aching   Pain Type Chronic pain   Pain Onset More than a month ago   Pain Frequency Constant   Aggravating Factors  cold, rainy weather   Pain Relieving Factors meds, joint protection techniques                      OT Treatments/Exercises (OP) - 03/22/16 0001      ADLs   Eating Simulated cutting food with rocker knife mod I level. Pt provided handout and educated in  how/where to purchase     Shoulder Exercises: ROM/Strengthening   Other ROM/Strengthening Exercises Reviewed HEP for cane ex's and postural ex - pt demo each x 10 reps     Fine Motor Coordination   Fine Motor Coordination Large Pegboard   Large Pegboard Pt placing large pegs in pegboard Lt hand for coordination with min drops and mod difficulty d/t ataxic movements                OT Education - 03/22/16 1421    Education provided Yes   Education Details Review of HEP, A/E recommendations   Person(s) Educated Patient   Methods Explanation;Handout   Comprehension Verbalized understanding          OT Short Term Goals - 03/20/16 1326      OT SHORT TERM GOAL #1   Title Independent with HEP for coordination and wrist/grip strengthening LUE (All STG's due 04/01/16)    Time 4   Period Weeks   Status Achieved     OT SHORT TERM GOAL #2   Title Independent with splint wear and care prn  Time 4   Period Weeks   Status Achieved     OT SHORT TERM GOAL #3   Title Pt to verbalize understanding with joint protection techniques and compensatory strategies and A/E needs to increase ease/independence with ADLS and decrease pain   Time 4   Period Weeks   Status Achieved           OT Long Term Goals - 03/22/16 1423      OT LONG TERM GOAL #1   Title Independent with updated HEP PRN (All LTG's due 05/01/16)    Time 8   Period Weeks   Status Deferred  no longer needed     OT LONG TERM GOAL #2   Title Pt to cut food Mod I level with A/E PRN   Time 8   Period Weeks   Status Achieved  in clinic with rocker knife     OT LONG TERM GOAL #3   Title Improve coordination as evidenced by improving LUE on Box & Blocks to 25 or greater    Baseline EVAL = 19   Time 8   Period Weeks   Status On-going     OT LONG TERM GOAL #4   Title Pt to increase grip strength to 25 lbs Lt hand to assist with opening containers    Baseline eval = 16 lbs (Rt = 33 lbs)    Time 8   Period Weeks    Status On-going     OT LONG TERM GOAL #5   Title Pt/caregiver to verbalize safety techniques for decreased sensation LUE and to report overall increased safety with Lt hand placement on walker during ambulation   Time 8   Period Weeks   Status On-going               Plan - 03/22/16 1424    Clinical Impression Statement Pt met LTG #2 in clinic today with A/E. Pt progressing towards LTG's.    Rehab Potential Fair   Clinical Impairments Affecting Rehab Potential time since onset, OA   OT Frequency 2x / week   OT Duration 8 weeks   OT Treatment/Interventions Self-care/ADL training;Therapeutic exercise;Functional Mobility Training;Patient/family education;Neuromuscular education;Splinting;Energy conservation;Parrafin;DME and/or AE instruction;Therapeutic activities;Fluidtherapy;Moist Heat;Passive range of motion   Plan continue coordination and grip strength LUE, begin assessing LTG's (anticipate d/c next week per pt/therapist discussion)   Consulted and Agree with Plan of Care Patient      Patient will benefit from skilled therapeutic intervention in order to improve the following deficits and impairments:  Decreased coordination, Decreased range of motion, Difficulty walking, Impaired flexibility, Improper body mechanics, Decreased endurance, Impaired sensation, Decreased knowledge of precautions, Decreased activity tolerance, Pain, Impaired UE functional use, Decreased knowledge of use of DME, Decreased balance, Decreased strength  Visit Diagnosis: Hemiplegia and hemiparesis following cerebral infarction affecting left non-dominant side (HCC)  Abnormal posture  Pain in joints of left hand  Ataxia, unspecified    Problem List Patient Active Problem List   Diagnosis Date Noted  . Encounter for preventive health examination 02/17/2016  . Morbid obesity ([place]) 06/17/2015  . Fall at home 12/01/2014  . Hypomagnesemia 04/24/2014  . Hemiparesis affecting left side as late  effect of cerebrovascular accident ([place]) 04/24/2014  . Nontraumatic cerebral hemorrhage ([place]) 04/30/2012  . Diabetes type 2, controlled ([place]) 03/04/2010  . OSTEOPENIA 01/21/2009  . UNSPECIFIED VITAMIN D DEFICIENCY 11/19/2007  . ESSENTIAL HYPERTENSION, BENIGN 11/19/2007  . HYPERCHOLESTEROLEMIA 10/25/2006  . GASTROESOPHAGEAL REFLUX, NO ESOPHAGITIS 10/25/2006  .  DIVERTICULOSIS OF COLON 10/25/2006  . Osteoarthritis 10/25/2006  . CERVICAL SPINE DISORDER, NOS 10/25/2006    Carey Bullocks, OTR/L 03/22/2016, 2:26 PM  [facility] [address]  [place], [place], [address] Phone: [phone]   Fax:  [phone]  Name: Karen Jackson MRN: [id number] Date of Birth: [birth year]

## 2016-03-27 ENCOUNTER — Ambulatory Visit: Payer: Medicare Other | Admitting: Occupational Therapy

## 2016-03-27 DIAGNOSIS — M25542 Pain in joints of left hand: Secondary | ICD-10-CM | POA: Diagnosis not present

## 2016-03-27 DIAGNOSIS — I69354 Hemiplegia and hemiparesis following cerebral infarction affecting left non-dominant side: Secondary | ICD-10-CM | POA: Diagnosis not present

## 2016-03-27 DIAGNOSIS — M25532 Pain in left wrist: Secondary | ICD-10-CM | POA: Diagnosis not present

## 2016-03-27 DIAGNOSIS — R278 Other lack of coordination: Secondary | ICD-10-CM | POA: Diagnosis not present

## 2016-03-27 DIAGNOSIS — R270 Ataxia, unspecified: Secondary | ICD-10-CM

## 2016-03-27 DIAGNOSIS — R293 Abnormal posture: Secondary | ICD-10-CM | POA: Diagnosis not present

## 2016-03-27 NOTE — Therapy (Signed)
[facility] [address]  [place], [place], [address] Phone: [phone]   Fax:  [phone]  Occupational Therapy Treatment  Patient Details  Name: Karen Jackson MRN: [id number] Date of Birth: 1941-04-01 Referring Provider: Dr. [place] Hickman  Encounter Date: 03/27/2016      OT End of Session - 03/27/16 1409    Visit Number 7   Number of Visits 17   Date for OT Re-Evaluation 05/01/16   Authorization Type MCR - G Code needed   Authorization - Visit Number 7   Authorization - Number of Visits 10   OT Start Time 1100   OT Stop Time 1145   OT Time Calculation (min) 45 min   Activity Tolerance Patient tolerated treatment well      Past Medical History:  Diagnosis Date  . Arthritis   . Hyperlipidemia   . Hypertension   . Osteopenia     Past Surgical History:  Procedure Laterality Date  . ANKLE SURGERY    . APPENDECTOMY    . CHOLECYSTECTOMY    . HERNIA REPAIR     Esophagus  . JOINT REPLACEMENT     total- right partial- left  . MASTECTOMY PARTIAL / LUMPECTOMY  2012   left    There were no vitals filed for this visit.      Subjective Assessment - 03/27/16 1109    Subjective  I can't come on Wednesday, but I feel comfortable wrapping up today. I feel like I can practice things at home now   Pertinent History CVA 2013, OA   Patient Stated Goals To get my hand better and make sure it doesn't fall off the walker    Currently in Pain? Yes   Pain Score 2    Pain Location --  base of thumb   Pain Orientation Left   Pain Descriptors / Indicators Aching   Pain Type Chronic pain   Pain Onset More than a month ago   Pain Frequency Constant   Aggravating Factors  cold, rainy weather   Pain Relieving Factors meds, joint protection techniques            [facility] OT Assessment - 03/27/16 0001      Coordination   9 Hole Peg Test Right;Left   Left 9 Hole Peg Test placed 4 pegs in 2 minutes d/t severe  ataxic mvmvts and inability to grade pressure to pick up pegs   Box and Blocks Lt = 20     Hand Function   Left Hand Grip (lbs) 35 lbs                  OT Treatments/Exercises (OP) - 03/27/16 0001      ADLs   ADL Comments Discussed safety with LUE d/t lack of sensation including: always being aware of position of hand during transitional movements, with walker placement prior to ambulation, testing temperature of water with RUE, and avoiding anything hot, heavy, sharp, or breakable with Lt hand/UE. Pt verbalized understanding. Assessed remaining LTG's for d/c - see assessment     Hand Exercises   Other Hand Exercises Attempted gripper activity to pick up blocks using gripper with Lt hand, but unable d/t lack of sensation, lack of ability to grade pressure of grip and decreased control/coordination of hand placement on gripper   Other Hand Exercises Clothespin activity for coordination, control, and pinch strength with max drops initially d/t ataxia and inability to grade and control  movement. Pt cued to decrease pressure of pinch and pt able to then perform with only min drops                  OT Education - 03/27/16 1148    Education provided Yes   Education Details safety considerations and recommendations for lack of sensation LUE   Person(s) Educated Patient   Methods Explanation   Comprehension Verbalized understanding          OT Short Term Goals - 03/20/16 1326      OT SHORT TERM GOAL #1   Title Independent with HEP for coordination and wrist/grip strengthening LUE (All STG's due 04/01/16)    Time 4   Period Weeks   Status Achieved     OT SHORT TERM GOAL #2   Title Independent with splint wear and care prn    Time 4   Period Weeks   Status Achieved     OT SHORT TERM GOAL #3   Title Pt to verbalize understanding with joint protection techniques and compensatory strategies and A/E needs to increase ease/independence with ADLS and decrease pain   Time  4   Period Weeks   Status Achieved           OT Long Term Goals - 03/27/16 1401      OT LONG TERM GOAL #1   Title Independent with updated HEP PRN (All LTG's due 05/01/16)    Time 8   Period Weeks   Status Deferred  no longer needed     OT LONG TERM GOAL #2   Title Pt to cut food Mod I level with A/E PRN   Time 8   Period Weeks   Status Achieved  in clinic with rocker knife     OT LONG TERM GOAL #3   Title Improve coordination as evidenced by improving LUE on Box & Blocks to 25 or greater    Baseline EVAL = 19   Time 8   Period Weeks   Status Not Met  03/27/16: 20 blocks     OT LONG TERM GOAL #4   Title Pt to increase grip strength to 25 lbs Lt hand to assist with opening containers    Baseline eval = 16 lbs (Rt = 33 lbs)    Time 8   Period Weeks   Status Achieved  03/27/16: 35 lbs     OT LONG TERM GOAL #5   Title Pt/caregiver to verbalize safety techniques for decreased sensation LUE and to report overall increased safety with Lt hand placement on walker during ambulation   Time 8   Period Weeks   Status Achieved               Plan - 03/27/16 1402    Clinical Impression Statement Pt met all STG's and all LTG's except #3 d/t coordination deficits and ataxia.    Plan D/C O.T. per pt request   Consulted and Agree with Plan of Care Patient      Patient will benefit from skilled therapeutic intervention in order to improve the following deficits and impairments:  Decreased coordination, Decreased range of motion, Difficulty walking, Impaired flexibility, Improper body mechanics, Decreased endurance, Impaired sensation, Decreased knowledge of precautions, Decreased activity tolerance, Pain, Impaired UE functional use, Decreased knowledge of use of DME, Decreased balance, Decreased strength  Visit Diagnosis: Hemiplegia and hemiparesis following cerebral infarction affecting left non-dominant side (HCC)  Ataxia, unspecified  Other lack of coordination  G-Codes - 03/27/16 1404    Functional Assessment Tool Used LUE: Grip 35 lbs, Box & Blocks 20, 9 hole peg - unable to complete   Functional Limitation Carrying, moving and handling objects   Carrying, Moving and Handling Objects Goal Status ([id number]) At least 40 percent but less than 60 percent impaired, limited or restricted   Carrying, Moving and Handling Objects Discharge Status [phone]) At least 60 percent but less than 80 percent impaired, limited or restricted      Problem List Patient Active Problem List   Diagnosis Date Noted  . Encounter for preventive health examination 02/17/2016  . Morbid obesity ([place]) 06/17/2015  . Fall at home 12/01/2014  . Hypomagnesemia 04/24/2014  . Hemiparesis affecting left side as late effect of cerebrovascular accident ([place]) 04/24/2014  . Nontraumatic cerebral hemorrhage ([place]) 04/30/2012  . Diabetes type 2, controlled ([place]) 03/04/2010  . OSTEOPENIA 01/21/2009  . UNSPECIFIED VITAMIN D DEFICIENCY 11/19/2007  . ESSENTIAL HYPERTENSION, BENIGN 11/19/2007  . HYPERCHOLESTEROLEMIA 10/25/2006  . GASTROESOPHAGEAL REFLUX, NO ESOPHAGITIS 10/25/2006  . DIVERTICULOSIS OF COLON 10/25/2006  . Osteoarthritis 10/25/2006  . CERVICAL SPINE DISORDER, NOS 10/25/2006     Visits from Start of Care: 7  Current functional level related to goals / functional outcomes: See above   Remaining deficits: Ataxia LUE Coordination   Education / Equipment: HEP's, safety considerations, splint wear and care  Plan: Patient agrees to discharge.  Patient goals were met. Patient is being discharged due to meeting the stated rehab goals.  ?????      Carey Bullocks, OTR/L 03/27/2016, 2:10 PM  [facility] [address]  [place], [place], [address] Phone: [phone]   Fax:  [phone]  Name: Karen Jackson MRN: [id number] Date of Birth: Dec 08, 1940

## 2016-04-03 ENCOUNTER — Encounter: Payer: PRIVATE HEALTH INSURANCE | Admitting: Occupational Therapy

## 2016-04-05 ENCOUNTER — Encounter: Payer: PRIVATE HEALTH INSURANCE | Admitting: Occupational Therapy

## 2016-05-02 DIAGNOSIS — T2122XA Burn of second degree of abdominal wall, initial encounter: Secondary | ICD-10-CM | POA: Diagnosis not present

## 2016-05-04 ENCOUNTER — Telehealth: Payer: Self-pay | Admitting: Family Medicine

## 2016-05-04 NOTE — Telephone Encounter (Signed)
Return call to patient regarding medications.  Patient stated that her pharmacist told she should still be taking Norvasc and potassium.  Advised patient that during her last office visit with PCP 01/2016; provider stopped the HCTZ, Potassium and Norvasc.  Patient stated on the losartan-HCTZ medication.  Verbalized understanding; stating she knew he was wrong.  Derl Barrow, RN

## 2016-05-04 NOTE — Telephone Encounter (Signed)
Pt needs clarification about her new medication replacing another medication. Pt has concerns because the pharmacy told her it does not replace other medications. Pt would like to be called on her cell phone. Please advise. Thanks! ep

## 2016-05-12 DIAGNOSIS — L245 Irritant contact dermatitis due to other chemical products: Secondary | ICD-10-CM | POA: Diagnosis not present

## 2016-05-12 DIAGNOSIS — Z85828 Personal history of other malignant neoplasm of skin: Secondary | ICD-10-CM | POA: Diagnosis not present

## 2016-05-29 DIAGNOSIS — Z23 Encounter for immunization: Secondary | ICD-10-CM | POA: Diagnosis not present

## 2016-06-29 ENCOUNTER — Other Ambulatory Visit: Payer: Self-pay | Admitting: Family Medicine

## 2016-06-29 DIAGNOSIS — M159 Polyosteoarthritis, unspecified: Secondary | ICD-10-CM

## 2016-06-29 DIAGNOSIS — M150 Primary generalized (osteo)arthritis: Principal | ICD-10-CM

## 2016-07-23 ENCOUNTER — Other Ambulatory Visit: Payer: Self-pay | Admitting: Family Medicine

## 2016-08-03 ENCOUNTER — Other Ambulatory Visit: Payer: Self-pay | Admitting: Family Medicine

## 2016-08-16 ENCOUNTER — Telehealth: Payer: Self-pay | Admitting: Family Medicine

## 2016-08-16 DIAGNOSIS — I69354 Hemiplegia and hemiparesis following cerebral infarction affecting left non-dominant side: Secondary | ICD-10-CM

## 2016-08-16 NOTE — Assessment & Plan Note (Signed)
Will reorder PT as requested

## 2016-08-16 NOTE — Telephone Encounter (Signed)
Pt needs a referral for physical therapy. Please advise. Thanks! ep

## 2016-08-24 ENCOUNTER — Other Ambulatory Visit: Payer: Self-pay | Admitting: Family Medicine

## 2016-09-01 ENCOUNTER — Encounter: Payer: Self-pay | Admitting: Physical Therapy

## 2016-09-01 ENCOUNTER — Ambulatory Visit: Payer: Medicare Other | Attending: Family Medicine | Admitting: Physical Therapy

## 2016-09-01 DIAGNOSIS — M6281 Muscle weakness (generalized): Secondary | ICD-10-CM | POA: Diagnosis not present

## 2016-09-01 DIAGNOSIS — R262 Difficulty in walking, not elsewhere classified: Secondary | ICD-10-CM | POA: Diagnosis not present

## 2016-09-01 DIAGNOSIS — I69354 Hemiplegia and hemiparesis following cerebral infarction affecting left non-dominant side: Secondary | ICD-10-CM | POA: Diagnosis not present

## 2016-09-01 DIAGNOSIS — R296 Repeated falls: Secondary | ICD-10-CM | POA: Insufficient documentation

## 2016-09-01 NOTE — Therapy (Signed)
[facility] [address] [place], [place], [address] Phone: [phone]   Fax:  [phone]  Physical Therapy Evaluation  Patient Details  Name: Karen Jackson MRN: [id number] Date of Birth: 10/26/1940 Referring Provider: Andria Frames  Encounter Date: 09/01/2016      PT End of Session - 09/01/16 1057    Visit Number 1   Date for PT Re-Evaluation 10/31/15   PT Start Time 1004   PT Stop Time 1055   PT Time Calculation (min) 51 min   Activity Tolerance Patient limited by fatigue   Behavior During Therapy [facility] for tasks assessed/performed      Past Medical History:  Diagnosis Date  . Arthritis   . Hyperlipidemia   . Hypertension   . Osteopenia     Past Surgical History:  Procedure Laterality Date  . ANKLE SURGERY    . APPENDECTOMY    . CHOLECYSTECTOMY    . HERNIA REPAIR     Esophagus  . JOINT REPLACEMENT     total- right partial- left  . MASTECTOMY PARTIAL / LUMPECTOMY  2012   left    There were no vitals filed for this visit.       Subjective Assessment - 09/01/16 1008    Subjective Patient has been seen by [place] in the past for different issues resulting from a stroke a few years ago.  She was seen here last year with good improvements in her function and no falls.  She reports that over the past 6 months she has had 5 falls, she reports that she feels weak and has "no strength".  She reports that the left side is very weak and she is having difficulty getting up from sitting, she reports that a fall on December 24th she twisted her left knee and ankle, it has been swollen and sore since that time.   Limitations Walking;House hold activities   Patient Stated Goals get stronger, walk better and have no falls   Currently in Pain? Yes   Pain Score 3    Pain Location Ankle  and knee   Pain Orientation Left   Pain Descriptors / Indicators Tender;Sore   Pain Type Chronic pain   Pain Onset More than a month ago   Pain Frequency Constant   Aggravating Factors  falling and twisting the knee and ankle has increased the pain   Pain Relieving Factors rest and heat   Effect of Pain on Daily Activities difficulty getting up from the chair, difficulty walking and falling            [facility] PT Assessment - 09/01/16 0001      Assessment   Medical Diagnosis falls/ weakness   Referring Provider [place]   Onset Date/Surgical Date 08/20/16   Hand Dominance Right   Prior Therapy last year for late effect CVA     Precautions   Precautions Fall     Balance Screen   Has the patient fallen in the past 6 months Yes   How many times? 5   Has the patient had a decrease in activity level because of a fear of falling?  Yes   Is the patient reluctant to leave their home because of a fear of falling?  Yes     Home Environment   Additional Comments has a ramp into the home     Prior Function   Level of Independence Needs assistance with homemaking;Independent with household mobility with device  Leisure plays bridge     Observation/Other Assessments-Edema    Edema --  has some sweilling int he left ankle and knee     Sensation   Additional Comments she has decreased sensation of the left UE     Coordination   Finger Nose Finger Test able to do with the left hand, she demonstrates ataxic motions of the left UE, she has spasticity of the left hand and difficulty with coordination and opposition     Posture/Postural Control   Posture Comments slouched posture, tends to lean away from the left and slide down, demonstrates neglect of the left side, the arm hangs down and behind her     AROM   Left Shoulder Flexion 90 Degrees   Left Shoulder ABduction 60 Degrees   Left Shoulder Internal Rotation 30 Degrees   Left Shoulder External Rotation 60 Degrees   Right/Left Knee Left   Left Knee Extension 0   Left Knee Flexion 90   Left Ankle Dorsiflexion 10     Strength   Overall Strength Comments left knee  3/5, left ankle 3/5, left hip 3+/5, right LE 4-/5, left shoulder 3/5 in the ROM that is available     Palpation   Palpation comment she is tender in the left shoulder, left knee and left ankle, spasticity of the left UE and shoulder     Transfers   Comments has to pull up with walker, if not she pushes back and will fall back into the chair.       Ambulation/Gait   Gait Comments walk with a FWW 150 feet had to stop 3x to rest, slow step to type gait     Standardized Balance Assessment   Standardized Balance Assessment Five Times Sit to Stand   Five times sit to stand comments  50 seconds had to use walker to pull up, unable to use chair to push up secondary to her really pushing back     Timed Up and Go Test   Normal TUG (seconds) 57                             PT Short Term Goals - 09/01/16 1102      PT SHORT TERM GOAL #1   Title independent with initial HEP   Time 2   Period Weeks   Status New           PT Long Term Goals - 09/01/16 1102      PT LONG TERM GOAL #1   Title decrease timed up and go test to 25 seconds   Time 8   Period Weeks   Status New     PT LONG TERM GOAL #2   Title increased right LE strength to 4-/5   Time 8   Period Weeks   Status New     PT LONG TERM GOAL #3   Title increased left LE strength to 4-/5 for the hip and knee   Time 8   Period Weeks   Status New     PT LONG TERM GOAL #4   Title no falls over a 6 week period   Time 8   Period Weeks   Status New     PT LONG TERM GOAL #5   Title walk 600 feet with supervision and FWW   Time 8   Period Weeks   Status New  Plan - 09/01/16 1057    Clinical Impression Statement Patient has late effects of CVA, we have seen her off and on over the past few years.  She tends to not follow through very well with exercises at home.  She really has regressed over the past 9 months, she reports 5 falls over the past 6 months.  She is having difficulty  getting up from sitting, she reports a fall on 08/20/16 where she twisted her knee and is tender and having some swelling now.  She has significant neglect of the left side.  TUG time was 56 seconds.   Rehab Potential Fair   PT Frequency 2x / week   PT Duration 8 weeks   PT Treatment/Interventions ADLs/Self Care Home Management;Electrical Stimulation;Moist Heat;Therapeutic exercise;Therapeutic activities;Functional mobility training;Gait training;Balance training;Neuromuscular re-education;Patient/family education;Manual techniques;Passive range of motion      Patient will benefit from skilled therapeutic intervention in order to improve the following deficits and impairments:  Abnormal gait, Cardiopulmonary status limiting activity, Decreased activity tolerance, Decreased mobility, Decreased endurance, Decreased range of motion, Decreased strength, Difficulty walking, Impaired flexibility, Impaired sensation, Improper body mechanics, Pain, Impaired UE functional use  Visit Diagnosis: Muscle weakness (generalized) - Plan: PT plan of care cert/re-cert  Repeated falls - Plan: PT plan of care cert/re-cert  Difficulty in walking, not elsewhere classified - Plan: PT plan of care cert/re-cert      G-Codes - [id number]    Functional Assessment Tool Used foto 88% limitation   Functional Limitation Mobility: Walking and moving around   Mobility: Walking and Moving Around Current Status [phone]) At least 80 percent but less than 100 percent impaired, limited or restricted   Mobility: Walking and Moving Around Goal Status [phone]) At least 60 percent but less than 80 percent impaired, limited or restricted       Problem List Patient Active Problem List   Diagnosis Date Noted  . Encounter for preventive health examination 02/17/2016  . Morbid obesity ([place]) 06/17/2015  . Fall at home 12/01/2014  . Hypomagnesemia 04/24/2014  . Hemiparesis affecting left side as late effect of cerebrovascular  accident ([place]) 04/24/2014  . Nontraumatic cerebral hemorrhage ([place]) 04/30/2012  . Diabetes type 2, controlled ([place]) 03/04/2010  . OSTEOPENIA 01/21/2009  . UNSPECIFIED VITAMIN D DEFICIENCY 11/19/2007  . ESSENTIAL HYPERTENSION, BENIGN 11/19/2007  . HYPERCHOLESTEROLEMIA 10/25/2006  . GASTROESOPHAGEAL REFLUX, NO ESOPHAGITIS 10/25/2006  . DIVERTICULOSIS OF COLON 10/25/2006  . Osteoarthritis 10/25/2006  . CERVICAL SPINE DISORDER, NOS 10/25/2006    Sumner Boast., PT 09/01/2016, 12:06 PM  [facility] [address], [place], [address] Phone: [phone]   Fax:  [phone]  Name: Karen Jackson MRN: [id number] Date of Birth: 03-03-1941

## 2016-09-05 ENCOUNTER — Encounter: Payer: Self-pay | Admitting: Physical Therapy

## 2016-09-05 ENCOUNTER — Ambulatory Visit: Payer: Medicare Other | Admitting: Physical Therapy

## 2016-09-05 DIAGNOSIS — R262 Difficulty in walking, not elsewhere classified: Secondary | ICD-10-CM | POA: Diagnosis not present

## 2016-09-05 DIAGNOSIS — M6281 Muscle weakness (generalized): Secondary | ICD-10-CM | POA: Diagnosis not present

## 2016-09-05 DIAGNOSIS — R296 Repeated falls: Secondary | ICD-10-CM

## 2016-09-05 DIAGNOSIS — I69354 Hemiplegia and hemiparesis following cerebral infarction affecting left non-dominant side: Secondary | ICD-10-CM | POA: Diagnosis not present

## 2016-09-05 NOTE — Therapy (Signed)
[place] [address] [place], [place], [address] Phone: [phone]   Fax:  [phone]  Physical Therapy Treatment  Patient Details  Name: Karen Jackson MRN: [id number] Date of Birth: June 24, 1941 Referring Provider: Andria Frames  Encounter Date: 09/05/2016      PT End of Session - 09/05/16 1101    Visit Number 2   Date for PT Re-Evaluation 10/31/15   PT Start Time 1004   PT Stop Time 1052   PT Time Calculation (min) 48 min   Activity Tolerance Patient limited by fatigue   Behavior During Therapy [facility] for tasks assessed/performed      Past Medical History:  Diagnosis Date  . Arthritis   . Hyperlipidemia   . Hypertension   . Osteopenia     Past Surgical History:  Procedure Laterality Date  . ANKLE SURGERY    . APPENDECTOMY    . CHOLECYSTECTOMY    . HERNIA REPAIR     Esophagus  . JOINT REPLACEMENT     total- right partial- left  . MASTECTOMY PARTIAL / LUMPECTOMY  2012   left    There were no vitals filed for this visit.      Subjective Assessment - 09/05/16 1016    Subjective No falls, my ankle has not rolled since last week.  I just feel weak and unsteady   Currently in Pain? No/denies                         [facility] Adult PT Treatment/Exercise - 09/05/16 0001      Transfers   Comments worked on sit to stand without her pulling up on the walker which is her norm, needed black tband around her backside to get up, cues needed to stand up straight and bear weight on the left leg     Ambulation/Gait   Gait Comments 150' with HHA, cues for big steps and to pick up feet     High Level Balance   High Level Balance Comments worked on her bearing weight through the left LE     Knee/Hip Exercises: Aerobic   Tread Mill 1.0 mph x 4 minutes cues to watch out for the left hand to not slip off     Knee/Hip Exercises: Standing   Forward Step Up Step Height: 4";10 reps;2 sets;Both   Forward  Step Up Limitations very poor with step up leading with the right due to the left ankle going into a spastic pattern     Knee/Hip Exercises: Seated   Other Seated Knee/Hip Exercises seated weigthed ball lifts and trunk rotation, PWR exercises     Knee/Hip Exercises: Supine   Other Supine Knee/Hip Exercises yellow tband DF and eversion   Other Supine Knee/Hip Exercises single leg standing hold 3 seconds                  PT Short Term Goals - 09/01/16 1102      PT SHORT TERM GOAL #1   Title independent with initial HEP   Time 2   Period Weeks   Status New           PT Long Term Goals - 09/01/16 1102      PT LONG TERM GOAL #1   Title decrease timed up and go test to 25 seconds   Time 8   Period Weeks   Status New     PT LONG TERM GOAL #  2   Title increased right LE strength to 4-/5   Time 8   Period Weeks   Status New     PT LONG TERM GOAL #3   Title increased left LE strength to 4-/5 for the hip and knee   Time 8   Period Weeks   Status New     PT LONG TERM GOAL #4   Title no falls over a 6 week period   Time 8   Period Weeks   Status New     PT LONG TERM GOAL #5   Title walk 600 feet with supervision and FWW   Time 8   Period Weeks   Status New               Plan - 09/05/16 1102    Clinical Impression Statement Pateint demonstrates left side neglect, having increased shoulder popping could be that she is using her arms much more to push up and bearing more weight through the arms, left knee is very weak as is the left ankle   PT Next Visit Plan work on the left leg to get stronger   Consulted and Agree with Plan of Care Patient      Patient will benefit from skilled therapeutic intervention in order to improve the following deficits and impairments:     Visit Diagnosis: Muscle weakness (generalized)  Repeated falls  Difficulty in walking, not elsewhere classified     Problem List Patient Active Problem List   Diagnosis Date  Noted  . Encounter for preventive health examination 02/17/2016  . Morbid obesity ([place]) 06/17/2015  . Fall at home 12/01/2014  . Hypomagnesemia 04/24/2014  . Hemiparesis affecting left side as late effect of cerebrovascular accident ([place]) 04/24/2014  . Nontraumatic cerebral hemorrhage ([place]) 04/30/2012  . Diabetes type 2, controlled ([place]) 03/04/2010  . OSTEOPENIA 01/21/2009  . UNSPECIFIED VITAMIN D DEFICIENCY 11/19/2007  . ESSENTIAL HYPERTENSION, BENIGN 11/19/2007  . HYPERCHOLESTEROLEMIA 10/25/2006  . GASTROESOPHAGEAL REFLUX, NO ESOPHAGITIS 10/25/2006  . DIVERTICULOSIS OF COLON 10/25/2006  . Osteoarthritis 10/25/2006  . CERVICAL SPINE DISORDER, NOS 10/25/2006    Sumner Boast., PT 09/05/2016, 11:04 AM  [address] [place], [place], [address] Phone: [phone]   Fax:  [phone]  Name: Karen Jackson MRN: [id number] Date of Birth: Nov 19, 1940

## 2016-09-07 ENCOUNTER — Ambulatory Visit: Payer: Medicare Other | Admitting: Physical Therapy

## 2016-09-07 ENCOUNTER — Encounter: Payer: Self-pay | Admitting: Physical Therapy

## 2016-09-07 DIAGNOSIS — R296 Repeated falls: Secondary | ICD-10-CM

## 2016-09-07 DIAGNOSIS — R262 Difficulty in walking, not elsewhere classified: Secondary | ICD-10-CM

## 2016-09-07 DIAGNOSIS — I69354 Hemiplegia and hemiparesis following cerebral infarction affecting left non-dominant side: Secondary | ICD-10-CM

## 2016-09-07 DIAGNOSIS — M6281 Muscle weakness (generalized): Secondary | ICD-10-CM | POA: Diagnosis not present

## 2016-09-07 NOTE — Therapy (Signed)
[place] [address] [place], [place], [address] Phone: [phone]   Fax:  [phone]  Physical Therapy Treatment  Patient Details  Name: TALANDA FERRIELL MRN: [id number] Date of Birth: [birth year] Referring Provider: Andria Frames  Encounter Date: 09/07/2016      PT End of Session - 09/07/16 1322    Visit Number 3   Date for PT Re-Evaluation 10/31/15   PT Start Time 1010   PT Stop Time 1055   PT Time Calculation (min) 45 min   Activity Tolerance Patient limited by fatigue   Behavior During Therapy [facility] for tasks assessed/performed      Past Medical History:  Diagnosis Date  . Arthritis   . Hyperlipidemia   . Hypertension   . Osteopenia     Past Surgical History:  Procedure Laterality Date  . ANKLE SURGERY    . APPENDECTOMY    . CHOLECYSTECTOMY    . HERNIA REPAIR     Esophagus  . JOINT REPLACEMENT     total- right partial- left  . MASTECTOMY PARTIAL / LUMPECTOMY  2012   left    There were no vitals filed for this visit.      Subjective Assessment - 09/07/16 1314    Subjective Patient reports that she was very sore and tired after the last visit   Currently in Pain? No/denies                         [facility] Adult PT Treatment/Exercise - 09/07/16 0001      Bed Mobility   Bed Mobility --  some bed mobility rolling 360     Ambulation/Gait   Gait Comments 150' with HHA, cues for big steps and to pick up feet     High Level Balance   High Level Balance Comments worked on her bearing weight through the left LE, standing ball toss, needed a lot of help maintaining balance     Knee/Hip Exercises: Aerobic   Tread Mill 1.0 mph x 5 minutes cues to watch out for the left hand to not slip off     Knee/Hip Exercises: Seated   Other Seated Knee/Hip Exercises seated weigthed ball lifts and trunk rotation, PWR exercises,    Sit to Sand 10 reps;with UE support  a lot of cues to stand up and  bear weight on the left LE     Knee/Hip Exercises: Supine   Bridges with Diona Foley Squeeze 2 sets;10 reps   Straight Leg Raises 2 sets;10 reps   Other Supine Knee/Hip Exercises yellow tband DF and eversion   Other Supine Knee/Hip Exercises partial sit ups with her reaching with the left arm,  tband leg pull downs, ball reaching in supine over head with both hands     Knee/Hip Exercises: Sidelying   Clams 2x10     Knee/Hip Exercises: Prone   Hamstring Curl 2 sets;10 reps   Hamstring Curl Limitations Prone on elbows trying to do a modified plank with PT assist                  PT Short Term Goals - 09/07/16 1327      PT SHORT TERM GOAL #1   Title independent with initial HEP   Status On-going           PT Long Term Goals - 09/01/16 1102      PT LONG TERM GOAL #1   Title  decrease timed up and go test to 25 seconds   Time 8   Period Weeks   Status New     PT LONG TERM GOAL #2   Title increased right LE strength to 4-/5   Time 8   Period Weeks   Status New     PT LONG TERM GOAL #3   Title increased left LE strength to 4-/5 for the hip and knee   Time 8   Period Weeks   Status New     PT LONG TERM GOAL #4   Title no falls over a 6 week period   Time 8   Period Weeks   Status New     PT LONG TERM GOAL #5   Title walk 600 feet with supervision and FWW   Time 8   Period Weeks   Status New               Plan - 09/07/16 1323    Clinical Impression Statement I tried to have her focus on the left side, we did some sit to stands without arms.  She has very poor balance, unable to bring her hips forward and stand up straight.  Very fearful.   PT Next Visit Plan work on the left leg to get stronger   Consulted and Agree with Plan of Care Patient      Patient will benefit from skilled therapeutic intervention in order to improve the following deficits and impairments:  Abnormal gait, Cardiopulmonary status limiting activity, Decreased activity  tolerance, Decreased mobility, Decreased endurance, Decreased range of motion, Decreased strength, Difficulty walking, Impaired flexibility, Impaired sensation, Improper body mechanics, Pain, Impaired UE functional use  Visit Diagnosis: Muscle weakness (generalized)  Repeated falls  Difficulty in walking, not elsewhere classified  Hemiplegia and hemiparesis following cerebral infarction affecting left non-dominant side [facility])     Problem List Patient Active Problem List   Diagnosis Date Noted  . Encounter for preventive health examination 02/17/2016  . Morbid obesity ([place]) 06/17/2015  . Fall at home 12/01/2014  . Hypomagnesemia 04/24/2014  . Hemiparesis affecting left side as late effect of cerebrovascular accident ([place]) 04/24/2014  . Nontraumatic cerebral hemorrhage ([place]) 04/30/2012  . Diabetes type 2, controlled ([place]) 03/04/2010  . OSTEOPENIA 01/21/2009  . UNSPECIFIED VITAMIN D DEFICIENCY 11/19/2007  . ESSENTIAL HYPERTENSION, BENIGN 11/19/2007  . HYPERCHOLESTEROLEMIA 10/25/2006  . GASTROESOPHAGEAL REFLUX, NO ESOPHAGITIS 10/25/2006  . DIVERTICULOSIS OF COLON 10/25/2006  . Osteoarthritis 10/25/2006  . CERVICAL SPINE DISORDER, NOS 10/25/2006    Sumner Boast., PT 09/07/2016, 1:29 PM  [address] [place], [place], [address] Phone: [phone]   Fax:  [phone]  Name: ALICEMARIE WOULARD MRN: [id number] Date of Birth: 10-Oct-1940

## 2016-09-12 ENCOUNTER — Encounter: Payer: Self-pay | Admitting: Physical Therapy

## 2016-09-12 ENCOUNTER — Ambulatory Visit: Payer: Medicare Other | Admitting: Physical Therapy

## 2016-09-12 DIAGNOSIS — I69354 Hemiplegia and hemiparesis following cerebral infarction affecting left non-dominant side: Secondary | ICD-10-CM

## 2016-09-12 DIAGNOSIS — R262 Difficulty in walking, not elsewhere classified: Secondary | ICD-10-CM

## 2016-09-12 DIAGNOSIS — R296 Repeated falls: Secondary | ICD-10-CM | POA: Diagnosis not present

## 2016-09-12 DIAGNOSIS — M6281 Muscle weakness (generalized): Secondary | ICD-10-CM | POA: Diagnosis not present

## 2016-09-12 NOTE — Therapy (Signed)
Karen [place], [place], [address] Phone: [phone]   Fax:  [phone]  Physical Therapy Treatment  Patient Details  Name: Karen Jackson MRN: [id number] Date of Birth: 06/02/1941 Referring Provider: Andria Jackson  Encounter Date: 09/12/2016      PT End of Session - 09/12/16 1257    Visit Number 4   Date for PT Re-Evaluation 10/31/15   PT Start Time 1006   PT Stop Time 1053   PT Time Calculation (min) 47 min   Activity Tolerance Patient limited by fatigue   Behavior During Therapy [facility] for tasks assessed/performed      Past Medical History:  Diagnosis Date  . Arthritis   . Hyperlipidemia   . Hypertension   . Osteopenia     Past Surgical History:  Procedure Laterality Date  . ANKLE SURGERY    . APPENDECTOMY    . CHOLECYSTECTOMY    . HERNIA REPAIR     Esophagus  . JOINT REPLACEMENT     total- right partial- left  . MASTECTOMY PARTIAL / LUMPECTOMY  2012   left    There were no vitals filed for this visit.      Subjective Assessment - 09/12/16 1007    Subjective Patient reports that she was very sore in the left knee and the right shoulder after the last treatment   Currently in Pain? Yes   Pain Score 5    Pain Location Knee  right shoulder   Pain Orientation Left   Pain Descriptors / Indicators Sore                         [facility] Adult PT Treatment/Exercise - 09/12/16 0001      Transfers   Comments worked on sit to stand without her pulling up on the walker which is her norm, needed black tband around her backside to get up, cues needed to stand up straight and bear weight on the left leg     Ambulation/Gait   Gait Comments 150' with HHA, cues for big steps and to pick up feet     High Level Balance   High Level Balance Activities Side stepping;Backward walking   High Level Balance Comments worked on her bearing weight through the left LE, standing ball toss,  needed a lot of help maintaining balance, seated balance with trying to get her to use the left arm and reach out to the left to elongate the trunk, again very difficult for her to do secondary to the neglect     Knee/Hip Exercises: Aerobic   Tread Mill 1.0 mph x 5 minutes cues to watch out for the left hand to not slip off     Knee/Hip Exercises: Supine   Other Supine Knee/Hip Exercises yellow tband DF and eversion                  PT Short Term Goals - 09/07/16 1327      PT SHORT TERM GOAL #1   Title independent with initial HEP   Status On-going           PT Long Term Goals - 09/01/16 1102      PT LONG TERM GOAL #1   Title decrease timed up and go test to 25 seconds   Time 8   Period Weeks   Status New     PT LONG TERM GOAL #2   Title  increased right LE strength to 4-/5   Time 8   Period Weeks   Status New     PT LONG TERM GOAL #3   Title increased left LE strength to 4-/5 for the hip and knee   Time 8   Period Weeks   Status New     PT LONG TERM GOAL #4   Title no falls over a 6 week period   Time 8   Period Weeks   Status New     PT LONG TERM GOAL #5   Title walk 600 feet with supervision and FWW   Time 8   Period Weeks   Status New               Plan - 09/12/16 1259    Clinical Impression Statement Patient had difficulty getting off of the treadmill today.  The left knee was very weak and she had difficulty holding herself up.  She continues to demonstrate some seriuos neglect of the left side, when standing and working on balance the left hip is rotated backwards and the left knee is in extension her trunk is in flexion.  She cannot correct due to her feeling like she is going to fall backwards   PT Next Visit Plan work on the left leg to get stronger and balance   Consulted and Agree with Plan of Care Patient      Patient will benefit from skilled therapeutic intervention in order to improve the following deficits and impairments:   Abnormal gait, Cardiopulmonary status limiting activity, Decreased activity tolerance, Decreased mobility, Decreased endurance, Decreased range of motion, Decreased strength, Difficulty walking, Impaired flexibility, Impaired sensation, Improper body mechanics, Pain, Impaired UE functional use  Visit Diagnosis: Muscle weakness (generalized)  Repeated falls  Difficulty in walking, not elsewhere classified  Hemiplegia and hemiparesis following cerebral infarction affecting left non-dominant side [facility])     Problem List Patient Active Problem List   Diagnosis Date Noted  . Encounter for preventive health examination 02/17/2016  . Morbid obesity ([place]) 06/17/2015  . Fall at home 12/01/2014  . Hypomagnesemia 04/24/2014  . Hemiparesis affecting left side as late effect of cerebrovascular accident ([place]) 04/24/2014  . Nontraumatic cerebral hemorrhage ([place]) 04/30/2012  . Diabetes type 2, controlled ([place]) 03/04/2010  . OSTEOPENIA 01/21/2009  . UNSPECIFIED VITAMIN D DEFICIENCY 11/19/2007  . ESSENTIAL HYPERTENSION, BENIGN 11/19/2007  . HYPERCHOLESTEROLEMIA 10/25/2006  . GASTROESOPHAGEAL REFLUX, NO ESOPHAGITIS 10/25/2006  . DIVERTICULOSIS OF COLON 10/25/2006  . Osteoarthritis 10/25/2006  . CERVICAL SPINE DISORDER, NOS 10/25/2006    Karen Jackson., PT 09/12/2016, 1:08 PM  [address] [place], [place], [address] Phone: [phone]   Fax:  [phone]  Name: Karen Jackson MRN: [id number] Date of Birth: 1941-02-13

## 2016-09-14 ENCOUNTER — Other Ambulatory Visit: Payer: Self-pay | Admitting: Family Medicine

## 2016-09-15 ENCOUNTER — Ambulatory Visit: Payer: Medicare Other | Admitting: Physical Therapy

## 2016-09-18 ENCOUNTER — Telehealth: Payer: Self-pay | Admitting: *Deleted

## 2016-09-18 NOTE — Telephone Encounter (Signed)
Prior Authorization received from [employer] for Diclofenac 1% gel. PA completed online at www.covermymeds.com. PA approved via [employer] until 08/27/17. Reference number: [id number].   Derl Barrow, RN

## 2016-09-19 ENCOUNTER — Encounter: Payer: Self-pay | Admitting: Physical Therapy

## 2016-09-19 ENCOUNTER — Ambulatory Visit: Payer: Medicare Other | Admitting: Physical Therapy

## 2016-09-19 DIAGNOSIS — I69354 Hemiplegia and hemiparesis following cerebral infarction affecting left non-dominant side: Secondary | ICD-10-CM | POA: Diagnosis not present

## 2016-09-19 DIAGNOSIS — R262 Difficulty in walking, not elsewhere classified: Secondary | ICD-10-CM

## 2016-09-19 DIAGNOSIS — M6281 Muscle weakness (generalized): Secondary | ICD-10-CM

## 2016-09-19 DIAGNOSIS — R296 Repeated falls: Secondary | ICD-10-CM

## 2016-09-19 NOTE — Therapy (Signed)
[place] [address] [address] [place], [place], [address] Phone: [phone]   Fax:  [phone]  Physical Therapy Treatment  Patient Details  Name: Karen Jackson MRN: [id number] Date of Birth: 04-22-1941 Referring Provider: Andria Frames  Encounter Date: 09/19/2016      PT End of Session - 09/19/16 1254    Visit Number 5   Date for PT Re-Evaluation 10/31/15   PT Start Time 1007   PT Stop Time 1055   PT Time Calculation (min) 48 min   Activity Tolerance Patient tolerated treatment well   Behavior During Therapy [facility] for tasks assessed/performed      Past Medical History:  Diagnosis Date  . Arthritis   . Hyperlipidemia   . Hypertension   . Osteopenia     Past Surgical History:  Procedure Laterality Date  . ANKLE SURGERY    . APPENDECTOMY    . CHOLECYSTECTOMY    . HERNIA REPAIR     Esophagus  . JOINT REPLACEMENT     total- right partial- left  . MASTECTOMY PARTIAL / LUMPECTOMY  2012   left    There were no vitals filed for this visit.      Subjective Assessment - 09/19/16 1247    Subjective Reports that the snow caused her to not get out and do much.  Reports that she feels stiff and weak   Currently in Pain? No/denies                         [facility] Adult PT Treatment/Exercise - 09/19/16 0001      Bed Mobility   Bed Mobility Rolling Right;Rolling Left;Supine to Sit   Rolling Right --  5x   Rolling Left --  5x   Supine to Sit --  5x     Transfers   Comments worked on sit to stand without her pulling up on the walker which is her norm, needed black tband around her backside to get up, cues needed to stand up straight and bear weight on the left leg     Ambulation/Gait   Gait Comments 180 feet x2 with HHA     High Level Balance   High Level Balance Comments worked on her bearing weight through the left LE, standing ball toss, needed a lot of help maintaining balance, seated balance with  trying to get her to use the left arm and reach out to the left to elongate the trunk, again very difficult for her to do secondary to the neglect     Knee/Hip Exercises: Aerobic   Tread Mill 1.0 mph x 6 minutes cues to watch out for the left hand to not slip off     Knee/Hip Exercises: Standing   Other Standing Knee Exercises 4" toe touches with cane in right hand and HHA on the left     Knee/Hip Exercises: Supine   Bridges with Diona Foley Squeeze 2 sets;15 reps   Straight Leg Raises 2 sets;15 reps   Other Supine Knee/Hip Exercises yellow tband DF and eversion, 2# left hand punches and flexion, blue tband hip extension   Other Supine Knee/Hip Exercises partial sit ups with her reaching with the left arm,  tband leg pull downs, ball reaching in supine over head with both hands, bridges     Knee/Hip Exercises: Sidelying   Clams 2x10  PT Short Term Goals - 09/19/16 1255      PT SHORT TERM GOAL #1   Title independent with initial HEP   Status Achieved           PT Long Term Goals - 09/01/16 1102      PT LONG TERM GOAL #1   Title decrease timed up and go test to 25 seconds   Time 8   Period Weeks   Status New     PT LONG TERM GOAL #2   Title increased right LE strength to 4-/5   Time 8   Period Weeks   Status New     PT LONG TERM GOAL #3   Title increased left LE strength to 4-/5 for the hip and knee   Time 8   Period Weeks   Status New     PT LONG TERM GOAL #4   Title no falls over a 6 week period   Time 8   Period Weeks   Status New     PT LONG TERM GOAL #5   Title walk 600 feet with supervision and FWW   Time 8   Period Weeks   Status New               Plan - 09/19/16 1254    Clinical Impression Statement Did better today with ability to use the left side, did better standing today and bearing some weight on the left side.  Fatigues easily   PT Next Visit Plan work on the left leg to get stronger and balance   Consulted and  Agree with Plan of Care Patient      Patient will benefit from skilled therapeutic intervention in order to improve the following deficits and impairments:  Abnormal gait, Cardiopulmonary status limiting activity, Decreased activity tolerance, Decreased mobility, Decreased endurance, Decreased range of motion, Decreased strength, Difficulty walking, Impaired flexibility, Impaired sensation, Improper body mechanics, Pain, Impaired UE functional use  Visit Diagnosis: Muscle weakness (generalized)  Repeated falls  Difficulty in walking, not elsewhere classified  Hemiplegia and hemiparesis following cerebral infarction affecting left non-dominant side [facility])     Problem List Patient Active Problem List   Diagnosis Date Noted  . Encounter for preventive health examination 02/17/2016  . Morbid obesity ([place]) 06/17/2015  . Fall at home 12/01/2014  . Hypomagnesemia 04/24/2014  . Hemiparesis affecting left side as late effect of cerebrovascular accident ([place]) 04/24/2014  . Nontraumatic cerebral hemorrhage ([place]) 04/30/2012  . Diabetes type 2, controlled ([place]) 03/04/2010  . OSTEOPENIA 01/21/2009  . UNSPECIFIED VITAMIN D DEFICIENCY 11/19/2007  . ESSENTIAL HYPERTENSION, BENIGN 11/19/2007  . HYPERCHOLESTEROLEMIA 10/25/2006  . GASTROESOPHAGEAL REFLUX, NO ESOPHAGITIS 10/25/2006  . DIVERTICULOSIS OF COLON 10/25/2006  . Osteoarthritis 10/25/2006  . CERVICAL SPINE DISORDER, NOS 10/25/2006    Sumner Boast., PT 09/19/2016, 12:56 PM  [place] [address] [place], [place], [address] Phone: [phone]   Fax:  [phone]  Name: Karen Jackson MRN: [id number] Date of Birth: 02/03/1941

## 2016-09-22 ENCOUNTER — Encounter: Payer: Self-pay | Admitting: Physical Therapy

## 2016-09-22 ENCOUNTER — Ambulatory Visit: Payer: Medicare Other | Admitting: Physical Therapy

## 2016-09-22 DIAGNOSIS — R262 Difficulty in walking, not elsewhere classified: Secondary | ICD-10-CM | POA: Diagnosis not present

## 2016-09-22 DIAGNOSIS — R296 Repeated falls: Secondary | ICD-10-CM

## 2016-09-22 DIAGNOSIS — I69354 Hemiplegia and hemiparesis following cerebral infarction affecting left non-dominant side: Secondary | ICD-10-CM | POA: Diagnosis not present

## 2016-09-22 DIAGNOSIS — M6281 Muscle weakness (generalized): Secondary | ICD-10-CM | POA: Diagnosis not present

## 2016-09-22 NOTE — Therapy (Signed)
[place] [address], [place], [address] Phone: [phone]   Fax:  [phone]  Physical Therapy Treatment  Patient Details  Name: Karen Jackson MRN: [id number] Date of Birth: April 05, 1941 Referring Provider: Andria Frames  Encounter Date: 09/22/2016      PT End of Session - 09/22/16 1144    Visit Number 6   Date for PT Re-Evaluation 10/31/15   PT Start Time 1016   PT Stop Time 1102   PT Time Calculation (min) 46 min   Activity Tolerance Patient tolerated treatment well   Behavior During Therapy [facility] for tasks assessed/performed      Past Medical History:  Diagnosis Date  . Arthritis   . Hyperlipidemia   . Hypertension   . Osteopenia     Past Surgical History:  Procedure Laterality Date  . ANKLE SURGERY    . APPENDECTOMY    . CHOLECYSTECTOMY    . HERNIA REPAIR     Esophagus  . JOINT REPLACEMENT     total- right partial- left  . MASTECTOMY PARTIAL / LUMPECTOMY  2012   left    There were no vitals filed for this visit.      Subjective Assessment - 09/22/16 1140    Subjective I think my core is getting stonger I have not had as much bladder issues the past weak   Currently in Pain? No/denies                         [facility] Adult PT Treatment/Exercise - 09/22/16 0001      Bed Mobility   Bed Mobility Rolling Right;Rolling Left;Supine to Sit   Rolling Right --  5x   Rolling Left --  5x     Ambulation/Gait   Gait Comments 180 feet x2 with HHA     High Level Balance   High Level Balance Comments worked on her bearing weight through the left LE, standing ball toss, needed a lot of help maintaining balance, seated balance with trying to get her to use the left arm and reach out to the left to elongate the trunk, again very difficult for her to do secondary to the neglect     Knee/Hip Exercises: Aerobic   Tread Mill 1.0 mph x 6 minutes cues to watch out for the left hand to not  slip off     Knee/Hip Exercises: Standing   Other Standing Knee Exercises 6" toe touches with cane in right hand and HHA on the left     Knee/Hip Exercises: Supine   Bridges with Diona Foley Squeeze 2 sets;15 reps   Straight Leg Raises 2 sets;15 reps   Other Supine Knee/Hip Exercises yellow tband DF and eversion, 2# left hand punches and flexion, blue tband hip extension   Other Supine Knee/Hip Exercises partial sit ups with her reaching with the left arm,  tband leg pull downs, ball reaching in supine over head with both hands, bridges     Knee/Hip Exercises: Sidelying   Clams 2x10   Other Sidelying Knee/Hip Exercises blue tband hip extension, sliding board hip abduction                PT Education - 09/22/16 1143    Education provided Yes   Education Details talked with her about Kegel's exercises   Person(s) Educated Patient   Methods Explanation   Comprehension Verbalized understanding  PT Short Term Goals - 09/19/16 1255      PT SHORT TERM GOAL #1   Title independent with initial HEP   Status Achieved           PT Long Term Goals - 09/22/16 1146      PT LONG TERM GOAL #1   Title decrease timed up and go test to 25 seconds   Status On-going     PT LONG TERM GOAL #4   Title no falls over a 6 week period   Status On-going               Plan - 09/22/16 1145    Clinical Impression Statement Patient reports that she is getting in and out of bed with less difficulty, she also reports less bladder leakage with this after [place] starting core exercises.  Continues with left sided neglect      Patient will benefit from skilled therapeutic intervention in order to improve the following deficits and impairments:  Abnormal gait, Cardiopulmonary status limiting activity, Decreased activity tolerance, Decreased mobility, Decreased endurance, Decreased range of motion, Decreased strength, Difficulty walking, Impaired flexibility, Impaired sensation, Improper  body mechanics, Pain, Impaired UE functional use  Visit Diagnosis: Muscle weakness (generalized)  Repeated falls  Difficulty in walking, not elsewhere classified     Problem List Patient Active Problem List   Diagnosis Date Noted  . Encounter for preventive health examination 02/17/2016  . Morbid obesity ([place]) 06/17/2015  . Fall at home 12/01/2014  . Hypomagnesemia 04/24/2014  . Hemiparesis affecting left side as late effect of cerebrovascular accident ([place]) 04/24/2014  . Nontraumatic cerebral hemorrhage ([place]) 04/30/2012  . Diabetes type 2, controlled ([place]) 03/04/2010  . OSTEOPENIA 01/21/2009  . UNSPECIFIED VITAMIN D DEFICIENCY 11/19/2007  . ESSENTIAL HYPERTENSION, BENIGN 11/19/2007  . HYPERCHOLESTEROLEMIA 10/25/2006  . GASTROESOPHAGEAL REFLUX, NO ESOPHAGITIS 10/25/2006  . DIVERTICULOSIS OF COLON 10/25/2006  . Osteoarthritis 10/25/2006  . CERVICAL SPINE DISORDER, NOS 10/25/2006    Sumner Boast., PTDictation #1 [id number]  [id number]  09/22/2016, 11:47 AM  [address] [place], [place], [address] Phone: [phone]   Fax:  [phone]  Name: AYAHNA KARY MRN: [id number] Date of Birth: 08/19/1941

## 2016-09-26 ENCOUNTER — Ambulatory Visit: Payer: Medicare Other | Admitting: Physical Therapy

## 2016-09-26 ENCOUNTER — Encounter: Payer: Self-pay | Admitting: Physical Therapy

## 2016-09-26 DIAGNOSIS — M6281 Muscle weakness (generalized): Secondary | ICD-10-CM

## 2016-09-26 DIAGNOSIS — R296 Repeated falls: Secondary | ICD-10-CM

## 2016-09-26 DIAGNOSIS — I69354 Hemiplegia and hemiparesis following cerebral infarction affecting left non-dominant side: Secondary | ICD-10-CM | POA: Diagnosis not present

## 2016-09-26 DIAGNOSIS — R262 Difficulty in walking, not elsewhere classified: Secondary | ICD-10-CM | POA: Diagnosis not present

## 2016-09-26 NOTE — Therapy (Signed)
[place], [place], [address] Phone: [phone]   Fax:  [phone]  Physical Therapy Treatment  Patient Details  Name: Karen Jackson MRN: [id number] Date of Birth: 10-17-1940 Referring Provider: Andria Frames  Encounter Date: 09/26/2016      PT End of Session - 09/26/16 1110    Visit Number 7   Date for PT Re-Evaluation 10/31/15   PT Start Time 1010   PT Stop Time 1100   PT Time Calculation (min) 50 min   Activity Tolerance Patient tolerated treatment well   Behavior During Therapy [facility] for tasks assessed/performed      Past Medical History:  Diagnosis Date  . Arthritis   . Hyperlipidemia   . Hypertension   . Osteopenia     Past Surgical History:  Procedure Laterality Date  . ANKLE SURGERY    . APPENDECTOMY    . CHOLECYSTECTOMY    . HERNIA REPAIR     Esophagus  . JOINT REPLACEMENT     total- right partial- left  . MASTECTOMY PARTIAL / LUMPECTOMY  2012   left    There were no vitals filed for this visit.      Subjective Assessment - 09/26/16 1030    Subjective Just sore in my knee, I do think the core exercises are helping   Currently in Pain? No/denies                         [facility] Adult PT Treatment/Exercise - 09/26/16 0001      Ambulation/Gait   Gait Comments 180 feet with HHA, cues for big steps     High Level Balance   High Level Balance Activities Side stepping;Backward walking   High Level Balance Comments standing holding WBQC 6" toe touches, some standing and reaching while holding cane and without cane, cues for posture     Knee/Hip Exercises: Aerobic   Tread Mill 1.0 mph x 6 minutes     Knee/Hip Exercises: Supine   Bridges with Ball Squeeze 2 sets;15 reps   Other Supine Knee/Hip Exercises yellow tband DF and eversion, 2# left hand punches and flexion, blue tband hip extension, 2# SAQ's   Other Supine Knee/Hip Exercises partial sit ups with her  reaching with the left arm,  tband leg pull downs, ball reaching in supine over head with both hands, bridges     Knee/Hip Exercises: Sidelying   Clams 2x10   Other Sidelying Knee/Hip Exercises blue tband hip extension, sliding board hip abduction                  PT Short Term Goals - 09/19/16 1255      PT SHORT TERM GOAL #1   Title independent with initial HEP   Status Achieved           PT Long Term Goals - 09/22/16 1146      PT LONG TERM GOAL #1   Title decrease timed up and go test to 25 seconds   Status On-going     PT LONG TERM GOAL #4   Title no falls over a 6 week period   Status On-going               Plan - 09/26/16 1111    Clinical Impression Statement Patient did very well starting out but she easily fatigues and then needs some gaurding with the left knee and ankle and the  knee wants to give and the ankle wants to roll.   PT Next Visit Plan work on the left leg to get stronger and balance   Consulted and Agree with Plan of Care Patient      Patient will benefit from skilled therapeutic intervention in order to improve the following deficits and impairments:  Abnormal gait, Cardiopulmonary status limiting activity, Decreased activity tolerance, Decreased mobility, Decreased endurance, Decreased range of motion, Decreased strength, Difficulty walking, Impaired flexibility, Impaired sensation, Improper body mechanics, Pain, Impaired UE functional use  Visit Diagnosis: Muscle weakness (generalized)  Repeated falls  Difficulty in walking, not elsewhere classified     Problem List Patient Active Problem List   Diagnosis Date Noted  . Encounter for preventive health examination 02/17/2016  . Morbid obesity ([place]) 06/17/2015  . Fall at home 12/01/2014  . Hypomagnesemia 04/24/2014  . Hemiparesis affecting left side as late effect of cerebrovascular accident ([place]) 04/24/2014  . Nontraumatic cerebral hemorrhage ([place]) 04/30/2012  . Diabetes  type 2, controlled ([place]) 03/04/2010  . OSTEOPENIA 01/21/2009  . UNSPECIFIED VITAMIN D DEFICIENCY 11/19/2007  . ESSENTIAL HYPERTENSION, BENIGN 11/19/2007  . HYPERCHOLESTEROLEMIA 10/25/2006  . GASTROESOPHAGEAL REFLUX, NO ESOPHAGITIS 10/25/2006  . DIVERTICULOSIS OF COLON 10/25/2006  . Osteoarthritis 10/25/2006  . CERVICAL SPINE DISORDER, NOS 10/25/2006    Sumner Boast., PT 09/26/2016, 11:12 AM  [place] [address] [place], [place], [address] Phone: [phone]   Fax:  [phone]  Name: Karen Jackson MRN: [id number] Date of Birth: [birth year]

## 2016-09-28 ENCOUNTER — Other Ambulatory Visit: Payer: Self-pay | Admitting: Family Medicine

## 2016-09-29 ENCOUNTER — Ambulatory Visit: Payer: Medicare Other | Attending: Family Medicine | Admitting: Physical Therapy

## 2016-09-29 ENCOUNTER — Encounter: Payer: Self-pay | Admitting: Physical Therapy

## 2016-09-29 DIAGNOSIS — M25542 Pain in joints of left hand: Secondary | ICD-10-CM | POA: Diagnosis not present

## 2016-09-29 DIAGNOSIS — R293 Abnormal posture: Secondary | ICD-10-CM | POA: Diagnosis not present

## 2016-09-29 DIAGNOSIS — M6281 Muscle weakness (generalized): Secondary | ICD-10-CM | POA: Diagnosis not present

## 2016-09-29 DIAGNOSIS — R262 Difficulty in walking, not elsewhere classified: Secondary | ICD-10-CM

## 2016-09-29 DIAGNOSIS — R296 Repeated falls: Secondary | ICD-10-CM

## 2016-09-29 DIAGNOSIS — I6990 Unspecified sequelae of unspecified cerebrovascular disease: Secondary | ICD-10-CM | POA: Insufficient documentation

## 2016-09-29 DIAGNOSIS — R278 Other lack of coordination: Secondary | ICD-10-CM | POA: Diagnosis not present

## 2016-09-29 DIAGNOSIS — I69354 Hemiplegia and hemiparesis following cerebral infarction affecting left non-dominant side: Secondary | ICD-10-CM | POA: Diagnosis not present

## 2016-09-29 NOTE — Therapy (Signed)
[place], [place], [address] Phone: [phone]   Fax:  [phone]  Physical Therapy Treatment  Patient Details  Name: Karen Jackson MRN: [id number] Date of Birth: 1940/10/08 Referring Provider: Andria Frames  Encounter Date: 09/29/2016      PT End of Session - 09/29/16 1148    Visit Number 8   Date for PT Re-Evaluation 10/31/15   PT Start Time 1011   PT Stop Time 1100   PT Time Calculation (min) 49 min   Activity Tolerance Patient tolerated treatment well   Behavior During Therapy [facility] for tasks assessed/performed      Past Medical History:  Diagnosis Date  . Arthritis   . Hyperlipidemia   . Hypertension   . Osteopenia     Past Surgical History:  Procedure Laterality Date  . ANKLE SURGERY    . APPENDECTOMY    . CHOLECYSTECTOMY    . HERNIA REPAIR     Esophagus  . JOINT REPLACEMENT     total- right partial- left  . MASTECTOMY PARTIAL / LUMPECTOMY  2012   left    There were no vitals filed for this visit.      Subjective Assessment - 09/29/16 1145    Subjective Still sore, but feeling better   Currently in Pain? No/denies                         [facility] Adult PT Treatment/Exercise - 09/29/16 0001      Transfers   Comments Worked on sit to stand needs cues to push up and then get to straight standing     Ambulation/Gait   Gait Comments 180 feet with HHA, cues for big steps, 15' [facility] with HHA     High Level Balance   High Level Balance Activities Side stepping;Backward walking   High Level Balance Comments standing holding WBQC 8" toe touches, some standing and reaching while holding cane and without cane, cues for posture     Knee/Hip Exercises: Aerobic   Tread Mill 1.0 mph x 6 minutes     Knee/Hip Exercises: Supine   Bridges with Ball Squeeze 2 sets;15 reps   Other Supine Knee/Hip Exercises yellow tband DF and eversion, 2# left hand punches and flexion,  blue tband hip extension, 2# SAQ's   Other Supine Knee/Hip Exercises partial sit ups with her reaching with the left arm,  tband leg pull downs, ball reaching in supine over head with both hands, bridges     Knee/Hip Exercises: Sidelying   Clams 2x10                  PT Short Term Goals - 09/19/16 1255      PT SHORT TERM GOAL #1   Title independent with initial HEP   Status Achieved           PT Long Term Goals - 09/29/16 1149      PT LONG TERM GOAL #1   Title decrease timed up and go test to 25 seconds   Status On-going     PT LONG TERM GOAL #2   Title increased right LE strength to 4-/5   Status On-going     PT LONG TERM GOAL #3   Title increased left LE strength to 4-/5 for the hip and knee   Status On-going     PT LONG TERM GOAL #4   Title no falls over  a 6 week period   Status On-going     PT LONG TERM GOAL #5   Title walk 600 feet with supervision and FWW   Status On-going               Plan - 09/29/16 1149    Clinical Impression Statement Continues to improve with her ability with bed mobility, still with some left side neglect.     PT Next Visit Plan work on the left leg to get stronger and balance   Consulted and Agree with Plan of Care Patient      Patient will benefit from skilled therapeutic intervention in order to improve the following deficits and impairments:  Abnormal gait, Cardiopulmonary status limiting activity, Decreased activity tolerance, Decreased mobility, Decreased endurance, Decreased range of motion, Decreased strength, Difficulty walking, Impaired flexibility, Impaired sensation, Improper body mechanics, Pain, Impaired UE functional use  Visit Diagnosis: Muscle weakness (generalized)  Repeated falls  Difficulty in walking, not elsewhere classified  Hemiplegia and hemiparesis following cerebral infarction affecting left non-dominant side [facility])     Problem List Patient Active Problem List   Diagnosis Date  Noted  . Encounter for preventive health examination 02/17/2016  . Morbid obesity ([place]) 06/17/2015  . Fall at home 12/01/2014  . Hypomagnesemia 04/24/2014  . Hemiparesis affecting left side as late effect of cerebrovascular accident ([place]) 04/24/2014  . Nontraumatic cerebral hemorrhage ([place]) 04/30/2012  . Diabetes type 2, controlled ([place]) 03/04/2010  . OSTEOPENIA 01/21/2009  . UNSPECIFIED VITAMIN D DEFICIENCY 11/19/2007  . ESSENTIAL HYPERTENSION, BENIGN 11/19/2007  . HYPERCHOLESTEROLEMIA 10/25/2006  . GASTROESOPHAGEAL REFLUX, NO ESOPHAGITIS 10/25/2006  . DIVERTICULOSIS OF COLON 10/25/2006  . Osteoarthritis 10/25/2006  . CERVICAL SPINE DISORDER, NOS 10/25/2006    Sumner Boast., PT 09/29/2016, 11:50 AM  [address] [address] [place], [place], [address] Phone: [phone]   Fax:  [phone]  Name: Karen Jackson MRN: [id number] Date of Birth: [birth year]

## 2016-10-02 ENCOUNTER — Encounter: Payer: Self-pay | Admitting: Physical Therapy

## 2016-10-02 ENCOUNTER — Ambulatory Visit: Payer: Medicare Other | Admitting: Physical Therapy

## 2016-10-02 DIAGNOSIS — R296 Repeated falls: Secondary | ICD-10-CM

## 2016-10-02 DIAGNOSIS — M6281 Muscle weakness (generalized): Secondary | ICD-10-CM | POA: Diagnosis not present

## 2016-10-02 DIAGNOSIS — I69354 Hemiplegia and hemiparesis following cerebral infarction affecting left non-dominant side: Secondary | ICD-10-CM

## 2016-10-02 DIAGNOSIS — R293 Abnormal posture: Secondary | ICD-10-CM | POA: Diagnosis not present

## 2016-10-02 DIAGNOSIS — R278 Other lack of coordination: Secondary | ICD-10-CM | POA: Diagnosis not present

## 2016-10-02 DIAGNOSIS — R262 Difficulty in walking, not elsewhere classified: Secondary | ICD-10-CM | POA: Diagnosis not present

## 2016-10-02 NOTE — Therapy (Signed)
[facility] [place], [place], [address] Phone: [phone]   Fax:  [phone]  Physical Therapy Treatment  Patient Details  Name: Karen Jackson MRN: [id number] Date of Birth: 1941-07-29 Referring Provider: Andria Frames  Encounter Date: 10/02/2016      PT End of Session - 10/02/16 1154    Visit Number 9   Date for PT Re-Evaluation 10/31/15   PT Start Time 0930   PT Stop Time [id number]   PT Time Calculation (min) 44 min   Activity Tolerance Patient tolerated treatment well   Behavior During Therapy [facility] for tasks assessed/performed      Past Medical History:  Diagnosis Date  . Arthritis   . Hyperlipidemia   . Hypertension   . Osteopenia     Past Surgical History:  Procedure Laterality Date  . ANKLE SURGERY    . APPENDECTOMY    . CHOLECYSTECTOMY    . HERNIA REPAIR     Esophagus  . JOINT REPLACEMENT     total- right partial- left  . MASTECTOMY PARTIAL / LUMPECTOMY  2012   left    There were no vitals filed for this visit.      Subjective Assessment - 10/02/16 0959    Subjective Knee is sore, that cold weather got me.  I feel stiff   Currently in Pain? Yes   Pain Score 3    Pain Location Knee                         [facility] Adult PT Treatment/Exercise - 10/02/16 0001      Ambulation/Gait   Gait Comments 180 feet with HHA, cues for big steps, 15' [facility] with HHA     High Level Balance   High Level Balance Activities Side stepping;Backward walking   High Level Balance Comments standing holding WBQC 8" toe touches, some standing and reaching while holding cane and without cane, cues for posture, needs PT to block the left ankle from rolling laterally at times     Knee/Hip Exercises: Aerobic   Tread Mill 1.0 mph x 6 minutes     Knee/Hip Exercises: Supine   Bridges with Ball Squeeze 2 sets;15 reps   Straight Leg Raises 2 sets;15 reps  cues to set core first   Other Supine Knee/Hip  Exercises yellow tband DF and eversion, 2# left hand punches and flexion, blue tband hip extension, 2# SAQ's   Other Supine Knee/Hip Exercises partial sit ups with her reaching with the left arm,  tband leg pull downs, ball reaching in supine over head with both hands, bridges, supine with her holding tband and doing trunk rotations     Knee/Hip Exercises: Sidelying   Clams 2x10   Other Sidelying Knee/Hip Exercises blue tband hip extension, sliding board hip abduction     Manual Therapy   Manual therapy comments PROM of the left shoulder, approximation of the fingers with motions to stimulate increased function                  PT Short Term Goals - 09/19/16 1255      PT SHORT TERM GOAL #1   Title independent with initial HEP   Status Achieved           PT Long Term Goals - 10/02/16 1156      PT LONG TERM GOAL #1   Title decrease timed up and go test to 25  seconds   Status On-going               Plan - 10/02/16 1154    Clinical Impression Statement Patient continues to have some issues with the fatigue especially noted after a weekend, she continues to have neglect of the left side, she has isses with the left ankle rolling at time and the left knee not extending fully with some left hip backward rotation   PT Next Visit Plan Needs G-code next visit   Consulted and Agree with Plan of Care Patient      Patient will benefit from skilled therapeutic intervention in order to improve the following deficits and impairments:  Abnormal gait, Cardiopulmonary status limiting activity, Decreased activity tolerance, Decreased mobility, Decreased endurance, Decreased range of motion, Decreased strength, Difficulty walking, Impaired flexibility, Impaired sensation, Improper body mechanics, Pain, Impaired UE functional use  Visit Diagnosis: Muscle weakness (generalized)  Repeated falls  Difficulty in walking, not elsewhere classified  Hemiplegia and hemiparesis  following cerebral infarction affecting left non-dominant side [facility])     Problem List Patient Active Problem List   Diagnosis Date Noted  . Encounter for preventive health examination 02/17/2016  . Morbid obesity ([place]) 06/17/2015  . Fall at home 12/01/2014  . Hypomagnesemia 04/24/2014  . Hemiparesis affecting left side as late effect of cerebrovascular accident ([place]) 04/24/2014  . Nontraumatic cerebral hemorrhage ([place]) 04/30/2012  . Diabetes type 2, controlled ([place]) 03/04/2010  . OSTEOPENIA 01/21/2009  . UNSPECIFIED VITAMIN D DEFICIENCY 11/19/2007  . ESSENTIAL HYPERTENSION, BENIGN 11/19/2007  . HYPERCHOLESTEROLEMIA 10/25/2006  . GASTROESOPHAGEAL REFLUX, NO ESOPHAGITIS 10/25/2006  . DIVERTICULOSIS OF COLON 10/25/2006  . Osteoarthritis 10/25/2006  . CERVICAL SPINE DISORDER, NOS 10/25/2006    Sumner Boast., PT 10/02/2016, 11:57 AM  [place] [address] [place], [place], [address] Phone: [phone]   Fax:  [phone]  Name: Karen Jackson MRN: [id number] Date of Birth: Jul 02, 1941

## 2016-10-03 DIAGNOSIS — H52203 Unspecified astigmatism, bilateral: Secondary | ICD-10-CM | POA: Diagnosis not present

## 2016-10-03 DIAGNOSIS — H26492 Other secondary cataract, left eye: Secondary | ICD-10-CM | POA: Diagnosis not present

## 2016-10-03 DIAGNOSIS — Z961 Presence of intraocular lens: Secondary | ICD-10-CM | POA: Diagnosis not present

## 2016-10-06 ENCOUNTER — Encounter: Payer: Self-pay | Admitting: Physical Therapy

## 2016-10-06 ENCOUNTER — Ambulatory Visit: Payer: Medicare Other | Admitting: Physical Therapy

## 2016-10-06 DIAGNOSIS — R262 Difficulty in walking, not elsewhere classified: Secondary | ICD-10-CM | POA: Diagnosis not present

## 2016-10-06 DIAGNOSIS — M6281 Muscle weakness (generalized): Secondary | ICD-10-CM

## 2016-10-06 DIAGNOSIS — R278 Other lack of coordination: Secondary | ICD-10-CM | POA: Diagnosis not present

## 2016-10-06 DIAGNOSIS — I69354 Hemiplegia and hemiparesis following cerebral infarction affecting left non-dominant side: Secondary | ICD-10-CM | POA: Diagnosis not present

## 2016-10-06 DIAGNOSIS — R293 Abnormal posture: Secondary | ICD-10-CM | POA: Diagnosis not present

## 2016-10-06 DIAGNOSIS — R296 Repeated falls: Secondary | ICD-10-CM | POA: Diagnosis not present

## 2016-10-06 NOTE — Therapy (Signed)
[place], [place], [address] Phone: [phone]   Fax:  [phone]  Physical Therapy Treatment  Patient Details  Name: Karen Jackson MRN: [id number] Date of Birth: [birth year] Referring Provider: Andria Frames  Encounter Date: 10/06/2016      PT End of Session - 10/06/16 1153    Visit Number 10   Date for PT Re-Evaluation 10/31/15   PT Start Time 1100   PT Stop Time 1150   PT Time Calculation (min) 50 min      Past Medical History:  Diagnosis Date  . Arthritis   . Hyperlipidemia   . Hypertension   . Osteopenia     Past Surgical History:  Procedure Laterality Date  . ANKLE SURGERY    . APPENDECTOMY    . CHOLECYSTECTOMY    . HERNIA REPAIR     Esophagus  . JOINT REPLACEMENT     total- right partial- left  . MASTECTOMY PARTIAL / LUMPECTOMY  2012   left    There were no vitals filed for this visit.      Subjective Assessment - 10/06/16 1149    Subjective knee hyperext while trying to pull pants up yesterday, had to have Charlie help me. knee still swelling   Currently in Pain? Yes   Pain Score 4    Pain Location Knee   Pain Orientation Left                         [facility] Adult PT Treatment/Exercise - 10/06/16 0001      Ambulation/Gait   Gait Comments 180 feet with HHA, cues for big steps  on the way out Left knee hyperext 5plus times     Knee/Hip Exercises: Standing   Other Standing Knee Exercises standing func reaching with weight ball   Other Standing Knee Exercises trunk rotation with tband      Knee/Hip Exercises: Seated   Other Seated Knee/Hip Exercises LAQ and HS curl red tband, ankle 4 ways red tband  green tband trunk ext and flex   Sit to [place] 5 reps;2 sets;without UE support  wt ball     Manual Therapy   Manual therapy comments PROM of the left shoulder, approximation of the fingers with motions to stimulate increased function                   PT Short Term Goals - 09/19/16 1255      PT SHORT TERM GOAL #1   Title independent with initial HEP   Status Achieved           PT Long Term Goals - 10/02/16 1156      PT LONG TERM GOAL #1   Title decrease timed up and go test to 25 seconds   Status On-going               Plan - 10/06/16 1153    Clinical Impression Statement knee swelling and hyperext so no wts or TM. Attempted standing on airex and bilateral ankle rolling. Increased assistance with amb outto car to left hyper ext 5plus times making pt a fall risk.   PT Next Visit Plan [place] core and LE strength      Patient will benefit from skilled therapeutic intervention in order to improve the following deficits and impairments:  Abnormal gait, Cardiopulmonary status limiting activity, Decreased activity tolerance, Decreased mobility, Decreased endurance, Decreased range of motion,  Decreased strength, Difficulty walking, Impaired flexibility, Impaired sensation, Improper body mechanics, Pain, Impaired UE functional use  Visit Diagnosis: Muscle weakness (generalized)  Repeated falls  Difficulty in walking, not elsewhere classified     Problem List Patient Active Problem List   Diagnosis Date Noted  . Encounter for preventive health examination 02/17/2016  . Morbid obesity ([place]) 06/17/2015  . Fall at home 12/01/2014  . Hypomagnesemia 04/24/2014  . Hemiparesis affecting left side as late effect of cerebrovascular accident ([place]) 04/24/2014  . Nontraumatic cerebral hemorrhage ([place]) 04/30/2012  . Diabetes type 2, controlled ([place]) 03/04/2010  . OSTEOPENIA 01/21/2009  . UNSPECIFIED VITAMIN D DEFICIENCY 11/19/2007  . ESSENTIAL HYPERTENSION, BENIGN 11/19/2007  . HYPERCHOLESTEROLEMIA 10/25/2006  . GASTROESOPHAGEAL REFLUX, NO ESOPHAGITIS 10/25/2006  . DIVERTICULOSIS OF COLON 10/25/2006  . Osteoarthritis 10/25/2006  . CERVICAL SPINE DISORDER, NOS 10/25/2006    ,ANGIE  PTA  10/06/2016, 11:55 AM  Shan Levans, DPT, CMP 11:59  [place] [address] [place], [place], [address] Phone: [phone]   Fax:  [phone]  Name: Karen Jackson MRN: [id number] Date of Birth: [birth year]

## 2016-10-10 ENCOUNTER — Ambulatory Visit: Payer: Medicare Other | Admitting: Physical Therapy

## 2016-10-10 ENCOUNTER — Encounter: Payer: Self-pay | Admitting: Physical Therapy

## 2016-10-10 DIAGNOSIS — I6990 Unspecified sequelae of unspecified cerebrovascular disease: Secondary | ICD-10-CM

## 2016-10-10 DIAGNOSIS — R293 Abnormal posture: Secondary | ICD-10-CM

## 2016-10-10 DIAGNOSIS — R296 Repeated falls: Secondary | ICD-10-CM

## 2016-10-10 DIAGNOSIS — R278 Other lack of coordination: Secondary | ICD-10-CM

## 2016-10-10 DIAGNOSIS — I69354 Hemiplegia and hemiparesis following cerebral infarction affecting left non-dominant side: Secondary | ICD-10-CM | POA: Diagnosis not present

## 2016-10-10 DIAGNOSIS — M25542 Pain in joints of left hand: Secondary | ICD-10-CM

## 2016-10-10 DIAGNOSIS — R262 Difficulty in walking, not elsewhere classified: Secondary | ICD-10-CM | POA: Diagnosis not present

## 2016-10-10 DIAGNOSIS — M6281 Muscle weakness (generalized): Secondary | ICD-10-CM | POA: Diagnosis not present

## 2016-10-10 NOTE — Therapy (Signed)
[place] [address] [place], [place], [address] Phone: [phone]   Fax:  [phone]  Physical Therapy Treatment  Patient Details  Name: Karen Jackson MRN: [id number] Date of Birth: Dec 13, 1940 Referring Provider: Andria Frames  Encounter Date: 10/10/2016      PT End of Session - 10/10/16 1130    Visit Number 11   Date for PT Re-Evaluation 10/31/15   PT Start Time 1000   PT Stop Time 1100   PT Time Calculation (min) 60 min      Past Medical History:  Diagnosis Date  . Arthritis   . Hyperlipidemia   . Hypertension   . Osteopenia     Past Surgical History:  Procedure Laterality Date  . ANKLE SURGERY    . APPENDECTOMY    . CHOLECYSTECTOMY    . HERNIA REPAIR     Esophagus  . JOINT REPLACEMENT     total- right partial- left  . MASTECTOMY PARTIAL / LUMPECTOMY  2012   left    There were no vitals filed for this visit.      Subjective Assessment - 10/10/16 1124    Subjective knee still hyper ext, more trouble getting up from seated   Currently in Pain? Yes   Pain Score 5    Pain Location Knee   Pain Orientation Left;Posterior;Medial                         [facility] Adult PT Treatment/Exercise - 10/10/16 0001      Ambulation/Gait   Gait Comments HHA 180 feet cuing for upright posture, and big  steps. Min A no hyperext with tape applied     Knee/Hip Exercises: Aerobic   Tread Mill 1.0 mph x 6 minutes     Knee/Hip Exercises: Standing   Forward Step Up Left;2 sets;10 reps;Hand Hold: 2  3# fwd and SW ( SW very hard for pt)   Other Standing Knee Exercises standing func reaching with weight ball  mini squats with blue band for controlled TKE 2 sets 10   Other Standing Knee Exercises trunk rotation with tband      Knee/Hip Exercises: Seated   Other Seated Knee/Hip Exercises LAQ 3# 3 sets 10 focus on TKE hold 3 sec  fitter ext 2 sets 10     Manual Therapy   Manual Therapy Taping   Manual therapy comments PROM of the left shoulder, approximation of the fingers with motions to stimulate increased function   [place];Inhibit Muscle  hyper ext X posterior, medial lantern                  PT Short Term Goals - 09/19/16 1255      PT SHORT TERM GOAL #1   Title independent with initial HEP   Status Achieved           PT Long Term Goals - 10/10/16 1131      PT LONG TERM GOAL #1   Title decrease timed up and go test to 25 seconds   Baseline when attempted today needed assistance to get up from mat so not a valid test today   Status On-going     PT LONG TERM GOAL #2   Title increased right LE strength to 4-/5   Status On-going     PT LONG TERM GOAL #3   Title increased left LE strength to 4-/5 for the hip and knee  Status On-going     PT LONG TERM GOAL #4   Title no falls over a 6 week period   Status On-going     PT LONG TERM GOAL #5   Title walk 600 feet with supervision and FWW   Status On-going               Plan - 10/10/16 1132    Clinical Impression Statement pt progressing with LTGS with strength and gait, TUG limited but decreased ability to stand independantly. pt with c/o post medial knee pain with tenderness- applyed kinesiotape to help with hyper ext which was successful and lanter tape medial for pain. Only 2 times knee hyper ext in session today vs 10 plus last session. Focus on quad stength and quad control for knee ext strength to control hyperext.   PT Next Visit Plan [place] core and LE strength      Patient will benefit from skilled therapeutic intervention in order to improve the following deficits and impairments:  Abnormal gait, Cardiopulmonary status limiting activity, Decreased activity tolerance, Decreased mobility, Decreased endurance, Decreased range of motion, Decreased strength, Difficulty walking, Impaired flexibility, Impaired sensation, Improper body mechanics, Pain, Impaired UE functional  use  Visit Diagnosis: Muscle weakness (generalized)  Repeated falls  Difficulty in walking, not elsewhere classified  Other lack of coordination  Abnormal posture  Pain in joints of left hand  Late effects of CVA (cerebrovascular accident)     Problem List Patient Active Problem List   Diagnosis Date Noted  . Encounter for preventive health examination 02/17/2016  . Morbid obesity ([place]) 06/17/2015  . Fall at home 12/01/2014  . Hypomagnesemia 04/24/2014  . Hemiparesis affecting left side as late effect of cerebrovascular accident ([place]) 04/24/2014  . Nontraumatic cerebral hemorrhage ([place]) 04/30/2012  . Diabetes type 2, controlled ([place]) 03/04/2010  . OSTEOPENIA 01/21/2009  . UNSPECIFIED VITAMIN D DEFICIENCY 11/19/2007  . ESSENTIAL HYPERTENSION, BENIGN 11/19/2007  . HYPERCHOLESTEROLEMIA 10/25/2006  . GASTROESOPHAGEAL REFLUX, NO ESOPHAGITIS 10/25/2006  . DIVERTICULOSIS OF COLON 10/25/2006  . Osteoarthritis 10/25/2006  . CERVICAL SPINE DISORDER, NOS 10/25/2006    ,ANGIE PTA 10/10/2016, 11:35 AM  [place] [address] [place], [place], [address] Phone: [phone]   Fax:  [phone]  Name: Karen Jackson MRN: [id number] Date of Birth: 07/04/1941

## 2016-10-13 ENCOUNTER — Ambulatory Visit: Payer: Medicare Other | Admitting: Physical Therapy

## 2016-10-13 ENCOUNTER — Encounter: Payer: Self-pay | Admitting: Physical Therapy

## 2016-10-13 DIAGNOSIS — M6281 Muscle weakness (generalized): Secondary | ICD-10-CM

## 2016-10-13 DIAGNOSIS — R278 Other lack of coordination: Secondary | ICD-10-CM | POA: Diagnosis not present

## 2016-10-13 DIAGNOSIS — R262 Difficulty in walking, not elsewhere classified: Secondary | ICD-10-CM | POA: Diagnosis not present

## 2016-10-13 DIAGNOSIS — R296 Repeated falls: Secondary | ICD-10-CM

## 2016-10-13 DIAGNOSIS — I69354 Hemiplegia and hemiparesis following cerebral infarction affecting left non-dominant side: Secondary | ICD-10-CM | POA: Diagnosis not present

## 2016-10-13 DIAGNOSIS — R293 Abnormal posture: Secondary | ICD-10-CM | POA: Diagnosis not present

## 2016-10-13 NOTE — Therapy (Signed)
[place] [address] [place], [place], [address] Phone: [phone]   Fax:  [phone]  Physical Therapy Treatment  Patient Details  Name: Karen Jackson MRN: [id number] Date of Birth: 28-Apr-1941 Referring Provider: Andria Frames  Encounter Date: 10/13/2016      PT End of Session - 10/13/16 1202    Visit Number 12   Date for PT Re-Evaluation 10/31/15   PT Start Time 1011   PT Stop Time 1100   PT Time Calculation (min) 49 min   Activity Tolerance Patient tolerated treatment well   Behavior During Therapy [facility] for tasks assessed/performed      Past Medical History:  Diagnosis Date  . Arthritis   . Hyperlipidemia   . Hypertension   . Osteopenia     Past Surgical History:  Procedure Laterality Date  . ANKLE SURGERY    . APPENDECTOMY    . CHOLECYSTECTOMY    . HERNIA REPAIR     Esophagus  . JOINT REPLACEMENT     total- right partial- left  . MASTECTOMY PARTIAL / LUMPECTOMY  2012   left    There were no vitals filed for this visit.      Subjective Assessment - 10/13/16 1048    Subjective Patient reports the left knee is sore, she reports that she had two instances that her ankle "rolled" on her.   Currently in Pain? Yes   Pain Score 4    Pain Location Knee   Pain Orientation Left                         [facility] Adult PT Treatment/Exercise - 10/13/16 0001      Ambulation/Gait   Gait Comments HHA 180 feet cuing for upright posture, and big  steps. Min A no hyperext with tape applied     Knee/Hip Exercises: Aerobic   Tread Mill 1.0 mph x 6 minutes     Knee/Hip Exercises: Standing   Other Standing Knee Exercises standing func reaching with weight ball   Other Standing Knee Exercises trunk rotation with tband      Knee/Hip Exercises: Seated   Other Seated Knee/Hip Exercises LAQ an SAQ's 3# 3 sets 10 focus on TKE hold 3 sec   Sit to Sand 5 reps;2 sets;without UE support     Knee/Hip  Exercises: Supine   Bridges with Diona Foley Squeeze 2 sets;15 reps   Straight Leg Raises 2 sets;15 reps   Other Supine Knee/Hip Exercises yellow tband DF and eversion, 2# left hand punches and flexion, blue tband hip extension, 2# SAQ's   Other Supine Knee/Hip Exercises partial sit ups with her reaching with the left arm,  tband leg pull downs, ball reaching in supine over head with both hands, bridges, supine with her holding tband and doing trunk rotations     Knee/Hip Exercises: Sidelying   Clams 2x10   Other Sidelying Knee/Hip Exercises blue tband hip extension, sliding board hip abduction     Knee/Hip Exercises: Prone   Hamstring Curl 2 sets;10 reps   Hamstring Curl Limitations with red tband sitting     Manual Therapy   Manual Therapy Taping   Manual therapy comments PROM of the left shoulder, approximation of the fingers with motions to stimulate increased function   [place];Inhibit Muscle                  PT Short Term Goals - 09/19/16  [place] #1   Title independent with initial HEP   Status Achieved           PT Long Term Goals - 10/10/16 1131      PT LONG TERM GOAL #1   Title decrease timed up and go test to 25 seconds   Baseline when attempted today needed assistance to get up from mat so not a valid test today   Status On-going     PT LONG TERM GOAL #2   Title increased right LE strength to 4-/5   Status On-going     PT LONG TERM GOAL #3   Title increased left LE strength to 4-/5 for the hip and knee   Status On-going     PT LONG TERM GOAL #4   Title no falls over a 6 week period   Status On-going     PT LONG TERM GOAL #5   Title walk 600 feet with supervision and FWW   Status On-going               Plan - 10/13/16 1202    Clinical Impression Statement The K-tape seems to help her knee with the hyperextension per her report.  She still has the neglect and at times will have the left ankle roll on  her.  Worked more on quad control today and some core   PT Next Visit Plan [place] core and LE strength   Consulted and Agree with Plan of Care Patient      Patient will benefit from skilled therapeutic intervention in order to improve the following deficits and impairments:  Abnormal gait, Cardiopulmonary status limiting activity, Decreased activity tolerance, Decreased mobility, Decreased endurance, Decreased range of motion, Decreased strength, Difficulty walking, Impaired flexibility, Impaired sensation, Improper body mechanics, Pain, Impaired UE functional use  Visit Diagnosis: Muscle weakness (generalized)  Repeated falls  Difficulty in walking, not elsewhere classified     Problem List Patient Active Problem List   Diagnosis Date Noted  . Encounter for preventive health examination 02/17/2016  . Morbid obesity ([place]) 06/17/2015  . Fall at home 12/01/2014  . Hypomagnesemia 04/24/2014  . Hemiparesis affecting left side as late effect of cerebrovascular accident ([place]) 04/24/2014  . Nontraumatic cerebral hemorrhage ([place]) 04/30/2012  . Diabetes type 2, controlled ([place]) 03/04/2010  . OSTEOPENIA 01/21/2009  . UNSPECIFIED VITAMIN D DEFICIENCY 11/19/2007  . ESSENTIAL HYPERTENSION, BENIGN 11/19/2007  . HYPERCHOLESTEROLEMIA 10/25/2006  . GASTROESOPHAGEAL REFLUX, NO ESOPHAGITIS 10/25/2006  . DIVERTICULOSIS OF COLON 10/25/2006  . Osteoarthritis 10/25/2006  . CERVICAL SPINE DISORDER, NOS 10/25/2006    Sumner Boast., PT 10/13/2016, 12:11 PM  [place] [address] [address] [place], [place], [address] Phone: [phone]   Fax:  [phone]  Name: Karen Jackson MRN: [id number] Date of Birth: 1941-07-23

## 2016-10-16 ENCOUNTER — Ambulatory Visit: Payer: Medicare Other | Admitting: Physical Therapy

## 2016-10-16 ENCOUNTER — Encounter: Payer: Self-pay | Admitting: Physical Therapy

## 2016-10-16 DIAGNOSIS — I69354 Hemiplegia and hemiparesis following cerebral infarction affecting left non-dominant side: Secondary | ICD-10-CM | POA: Diagnosis not present

## 2016-10-16 DIAGNOSIS — R262 Difficulty in walking, not elsewhere classified: Secondary | ICD-10-CM | POA: Diagnosis not present

## 2016-10-16 DIAGNOSIS — R278 Other lack of coordination: Secondary | ICD-10-CM | POA: Diagnosis not present

## 2016-10-16 DIAGNOSIS — R296 Repeated falls: Secondary | ICD-10-CM | POA: Diagnosis not present

## 2016-10-16 DIAGNOSIS — M6281 Muscle weakness (generalized): Secondary | ICD-10-CM | POA: Diagnosis not present

## 2016-10-16 DIAGNOSIS — R293 Abnormal posture: Secondary | ICD-10-CM | POA: Diagnosis not present

## 2016-10-16 NOTE — Therapy (Signed)
[place], [place], [address] Phone: [phone]   Fax:  [phone]  Physical Therapy Treatment  Patient Details  Name: Karen Jackson MRN: [id number] Date of Birth: [birth year] Referring Provider: Andria Frames  Encounter Date: 10/16/2016      PT End of Session - 10/16/16 1210    Visit Number 13   Date for PT Re-Evaluation 10/31/15   PT Start Time 1011   PT Stop Time 1055   PT Time Calculation (min) 44 min   Activity Tolerance Patient tolerated treatment well   Behavior During Therapy [facility] for tasks assessed/performed      Past Medical History:  Diagnosis Date  . Arthritis   . Hyperlipidemia   . Hypertension   . Osteopenia     Past Surgical History:  Procedure Laterality Date  . ANKLE SURGERY    . APPENDECTOMY    . CHOLECYSTECTOMY    . HERNIA REPAIR     Esophagus  . JOINT REPLACEMENT     total- right partial- left  . MASTECTOMY PARTIAL / LUMPECTOMY  2012   left    There were no vitals filed for this visit.      Subjective Assessment - 10/16/16 1208    Subjective No rolling of the ankle, reports that she always feels a little weak after a weekend   Currently in Pain? No/denies                         [facility] Adult PT Treatment/Exercise - 10/16/16 0001      Ambulation/Gait   Gait Comments HHA 180 feet cuing for upright posture, and big  steps. Min A no hyperext with tape applied     High Level Balance   High Level Balance Activities Side stepping;Backward walking   High Level Balance Comments weight shifts , standing reaching     Knee/Hip Exercises: Aerobic   Tread Mill 1.0 mph x 6 minutes     Knee/Hip Exercises: Standing   Other Standing Knee Exercises standing func reaching with weight ball   Other Standing Knee Exercises trunk rotation with tband      Knee/Hip Exercises: Seated   Other Seated Knee/Hip Exercises LAQ an SAQ's 3# 3 sets 10 focus on TKE hold  3 sec     Knee/Hip Exercises: Supine   Bridges with Ball Squeeze 2 sets;15 reps   Straight Leg Raises 2 sets;15 reps   Other Supine Knee/Hip Exercises yellow tband DF and eversion, 2# left hand punches and flexion, blue tband hip extension, 2# SAQ's   Other Supine Knee/Hip Exercises partial sit ups with her reaching with the left arm,  tband leg pull downs, ball reaching in supine over head with both hands, bridges, supine with her holding tband and doing trunk rotations                  PT Short Term Goals - 09/19/16 1255      PT SHORT TERM GOAL #1   Title independent with initial HEP   Status Achieved           PT Long Term Goals - 10/10/16 1131      PT LONG TERM GOAL #1   Title decrease timed up and go test to 25 seconds   Baseline when attempted today needed assistance to get up from mat so not a valid test today   Status On-going  PT LONG TERM GOAL #2   Title increased right LE strength to 4-/5   Status On-going     PT LONG TERM GOAL #3   Title increased left LE strength to 4-/5 for the hip and knee   Status On-going     PT LONG TERM GOAL #4   Title no falls over a 6 week period   Status On-going     PT LONG TERM GOAL #5   Title walk 600 feet with supervision and FWW   Status On-going               Plan - 10/16/16 1210    Clinical Impression Statement Patient fatigues easily especially after a weekend.  She gives good effort but at times the left LE just has no control unless she is cued to watch and think about it.   PT Next Visit Plan [place] core and LE strength   Consulted and Agree with Plan of Care Patient      Patient will benefit from skilled therapeutic intervention in order to improve the following deficits and impairments:  Abnormal gait, Cardiopulmonary status limiting activity, Decreased activity tolerance, Decreased mobility, Decreased endurance, Decreased range of motion, Decreased strength, Difficulty walking, Impaired  flexibility, Impaired sensation, Improper body mechanics, Pain, Impaired UE functional use  Visit Diagnosis: Muscle weakness (generalized)  Repeated falls  Difficulty in walking, not elsewhere classified     Problem List Patient Active Problem List   Diagnosis Date Noted  . Encounter for preventive health examination 02/17/2016  . Morbid obesity ([place]) 06/17/2015  . Fall at home 12/01/2014  . Hypomagnesemia 04/24/2014  . Hemiparesis affecting left side as late effect of cerebrovascular accident ([place]) 04/24/2014  . Nontraumatic cerebral hemorrhage ([place]) 04/30/2012  . Diabetes type 2, controlled ([place]) 03/04/2010  . OSTEOPENIA 01/21/2009  . UNSPECIFIED VITAMIN D DEFICIENCY 11/19/2007  . ESSENTIAL HYPERTENSION, BENIGN 11/19/2007  . HYPERCHOLESTEROLEMIA 10/25/2006  . GASTROESOPHAGEAL REFLUX, NO ESOPHAGITIS 10/25/2006  . DIVERTICULOSIS OF COLON 10/25/2006  . Osteoarthritis 10/25/2006  . CERVICAL SPINE DISORDER, NOS 10/25/2006    Sumner Boast., PT 10/16/2016, 12:11 PM  [place] [address] [place], [place], [address] Phone: [phone]   Fax:  [phone]  Name: Karen Jackson MRN: [id number] Date of Birth: Oct 21, 1940

## 2016-10-20 ENCOUNTER — Encounter: Payer: Self-pay | Admitting: Physical Therapy

## 2016-10-20 ENCOUNTER — Ambulatory Visit: Payer: Medicare Other | Admitting: Physical Therapy

## 2016-10-20 DIAGNOSIS — R293 Abnormal posture: Secondary | ICD-10-CM | POA: Diagnosis not present

## 2016-10-20 DIAGNOSIS — R278 Other lack of coordination: Secondary | ICD-10-CM | POA: Diagnosis not present

## 2016-10-20 DIAGNOSIS — M6281 Muscle weakness (generalized): Secondary | ICD-10-CM | POA: Diagnosis not present

## 2016-10-20 DIAGNOSIS — R296 Repeated falls: Secondary | ICD-10-CM

## 2016-10-20 DIAGNOSIS — R262 Difficulty in walking, not elsewhere classified: Secondary | ICD-10-CM

## 2016-10-20 DIAGNOSIS — I69354 Hemiplegia and hemiparesis following cerebral infarction affecting left non-dominant side: Secondary | ICD-10-CM | POA: Diagnosis not present

## 2016-10-20 NOTE — Therapy (Signed)
[place], [place], [address] Phone: [phone]   Fax:  [phone]  Physical Therapy Treatment  Patient Details  Name: Karen Jackson MRN: [id number] Date of Birth: May 13, 1941 Referring Provider: Andria Frames  Encounter Date: 10/20/2016      PT End of Session - 10/20/16 1122    Visit Number 14   Date for PT Re-Evaluation 10/31/15   PT Start Time 1012   PT Stop Time 1100   PT Time Calculation (min) 48 min   Activity Tolerance Patient tolerated treatment well   Behavior During Therapy [facility] for tasks assessed/performed      Past Medical History:  Diagnosis Date  . Arthritis   . Hyperlipidemia   . Hypertension   . Osteopenia     Past Surgical History:  Procedure Laterality Date  . ANKLE SURGERY    . APPENDECTOMY    . CHOLECYSTECTOMY    . HERNIA REPAIR     Esophagus  . JOINT REPLACEMENT     total- right partial- left  . MASTECTOMY PARTIAL / LUMPECTOMY  2012   left    There were no vitals filed for this visit.      Subjective Assessment - 10/20/16 1030    Subjective PAtient reports some swelling in the left knee and soreness, no falls   Currently in Pain? Yes   Pain Score 3    Pain Location Knee   Pain Orientation Left            [facility] PT Assessment - 10/20/16 0001      Timed Up and Go Test   Normal TUG (seconds) 43                     [facility] Adult PT Treatment/Exercise - 10/20/16 0001      Ambulation/Gait   Gait Comments HHA 180 feet cuing for upright posture, and big  steps. Min A no hyperext with tape applied     High Level Balance   High Level Balance Activities Side stepping;Backward walking   High Level Balance Comments weight shifts , standing reaching     Knee/Hip Exercises: Standing   Other Standing Knee Exercises standing func reaching with weight ball   Other Standing Knee Exercises trunk rotation with tband      Knee/Hip Exercises: Supine   Bridges with Ball Squeeze 2 sets;15 reps   Straight Leg Raises 2 sets;15 reps   Other Supine Knee/Hip Exercises red tband DF and eversion, 2# left hand punches and flexion, blue tband hip extension, 2# SAQ's   Other Supine Knee/Hip Exercises partial sit ups with her reaching with the left arm,  tband leg pull downs, ball reaching in supine over head with both hands, bridges, supine with her holding tband and doing trunk rotations     Knee/Hip Exercises: Sidelying   Clams 2x10   Other Sidelying Knee/Hip Exercises blue tband hip extension, sliding board hip abduction     Knee/Hip Exercises: Prone   Hamstring Curl 2 sets;10 reps   Hamstring Curl Limitations with red tband sitting                  PT Short Term Goals - 09/19/16 1255      PT SHORT TERM GOAL #1   Title independent with initial HEP   Status Achieved           PT Long Term Goals - 10/20/16 1124  PT LONG TERM GOAL #1   Title decrease timed up and go test to 25 seconds   Status Partially Met     PT LONG TERM GOAL #4   Title no falls over a 6 week period   Status Partially Met               Plan - 10/20/16 1123    Clinical Impression Statement Pateint has difficulty today bearing weight on the left leg, reported she did not trust it today, she also tended to not put the left foot down flat which was almost causing the left ankle to roll laterally.   PT Next Visit Plan [place] core and LE strength   Consulted and Agree with Plan of Care Patient      Patient will benefit from skilled therapeutic intervention in order to improve the following deficits and impairments:  Abnormal gait, Cardiopulmonary status limiting activity, Decreased activity tolerance, Decreased mobility, Decreased endurance, Decreased range of motion, Decreased strength, Difficulty walking, Impaired flexibility, Impaired sensation, Improper body mechanics, Pain, Impaired UE functional use  Visit Diagnosis: Muscle weakness  (generalized)  Repeated falls  Difficulty in walking, not elsewhere classified     Problem List Patient Active Problem List   Diagnosis Date Noted  . Encounter for preventive health examination 02/17/2016  . Morbid obesity ([place]) 06/17/2015  . Fall at home 12/01/2014  . Hypomagnesemia 04/24/2014  . Hemiparesis affecting left side as late effect of cerebrovascular accident ([place]) 04/24/2014  . Nontraumatic cerebral hemorrhage ([place]) 04/30/2012  . Diabetes type 2, controlled ([place]) 03/04/2010  . OSTEOPENIA 01/21/2009  . UNSPECIFIED VITAMIN D DEFICIENCY 11/19/2007  . ESSENTIAL HYPERTENSION, BENIGN 11/19/2007  . HYPERCHOLESTEROLEMIA 10/25/2006  . GASTROESOPHAGEAL REFLUX, NO ESOPHAGITIS 10/25/2006  . DIVERTICULOSIS OF COLON 10/25/2006  . Osteoarthritis 10/25/2006  . CERVICAL SPINE DISORDER, NOS 10/25/2006    Sumner Boast., PT 10/20/2016, 11:25 AM  [place] [address] [place], [place], [address] Phone: [phone]   Fax:  [phone]  Name: MYRISSA CHIPLEY MRN: [id number] Date of Birth: August 01, 1941

## 2016-10-24 ENCOUNTER — Ambulatory Visit: Payer: Medicare Other | Admitting: Physical Therapy

## 2016-10-24 ENCOUNTER — Encounter: Payer: Self-pay | Admitting: Physical Therapy

## 2016-10-24 DIAGNOSIS — R293 Abnormal posture: Secondary | ICD-10-CM | POA: Diagnosis not present

## 2016-10-24 DIAGNOSIS — R262 Difficulty in walking, not elsewhere classified: Secondary | ICD-10-CM | POA: Diagnosis not present

## 2016-10-24 DIAGNOSIS — R296 Repeated falls: Secondary | ICD-10-CM | POA: Diagnosis not present

## 2016-10-24 DIAGNOSIS — R278 Other lack of coordination: Secondary | ICD-10-CM

## 2016-10-24 DIAGNOSIS — M6281 Muscle weakness (generalized): Secondary | ICD-10-CM | POA: Diagnosis not present

## 2016-10-24 DIAGNOSIS — I69354 Hemiplegia and hemiparesis following cerebral infarction affecting left non-dominant side: Secondary | ICD-10-CM | POA: Diagnosis not present

## 2016-10-24 NOTE — Therapy (Signed)
[name], [place], [address] Phone: [phone]   Fax:  [phone]  Physical Therapy Treatment  Patient Details  Name: Karen Jackson MRN: [id number] Date of Birth: February 10, 1941 Referring Provider: Andria Frames  Encounter Date: 10/24/2016      PT End of Session - 10/24/16 1048    Visit Number 15   Date for PT Re-Evaluation 10/31/15   PT Start Time 1000   PT Stop Time 1048   PT Time Calculation (min) 48 min   Activity Tolerance Patient tolerated treatment well   Behavior During Therapy [facility] for tasks assessed/performed      Past Medical History:  Diagnosis Date  . Arthritis   . Hyperlipidemia   . Hypertension   . Osteopenia     Past Surgical History:  Procedure Laterality Date  . ANKLE SURGERY    . APPENDECTOMY    . CHOLECYSTECTOMY    . HERNIA REPAIR     Esophagus  . JOINT REPLACEMENT     total- right partial- left  . MASTECTOMY PARTIAL / LUMPECTOMY  2012   left    There were no vitals filed for this visit.      Subjective Assessment - 10/24/16 1031    Subjective no falls, reports pretty good weekend                         [facility] Adult PT Treatment/Exercise - 10/24/16 0001      Ambulation/Gait   Gait Comments HHA 180 feet cuing for upright posture, and big  steps.     Knee/Hip Exercises: Aerobic   Tread Mill 1.0 mph x 6 minutes     Knee/Hip Exercises: Machines for Strengthening   Cybex Leg Press 20# 2x10 with cues for control of the left knee, then no weight left leg only     Knee/Hip Exercises: Standing   Other Standing Knee Exercises standing func reaching with weight ball   Other Standing Knee Exercises trunk rotation with tband      Knee/Hip Exercises: Supine   Bridges with Diona Foley Squeeze 2 sets;15 reps   Straight Leg Raises 2 sets;15 reps   Other Supine Knee/Hip Exercises red tband DF and eversion, 2# left hand punches and flexion, blue tband hip  extension, 2# SAQ's   Other Supine Knee/Hip Exercises partial sit ups with her reaching with the left arm,  tband leg pull downs, ball reaching in supine over head with both hands, bridges, supine with her holding tband and doing trunk rotations     Knee/Hip Exercises: Sidelying   Clams 2x10   Other Sidelying Knee/Hip Exercises blue tband hip extension, sliding board hip abduction     Knee/Hip Exercises: Prone   Hamstring Curl 2 sets;10 reps   Hamstring Curl Limitations with red tband sitting                  PT Short Term Goals - 09/19/16 1255      PT SHORT TERM GOAL #1   Title independent with initial HEP   Status Achieved           PT Long Term Goals - 10/20/16 1124      PT LONG TERM GOAL #1   Title decrease timed up and go test to 25 seconds   Status Partially Met     PT LONG TERM GOAL #4   Title no falls over a 6 week period  Status Partially Met               Plan - 10/24/16 1048    Clinical Impression Statement Patient was very fatigued after the exercises, she needed constant cues to control the left hip as it wants to externally rotate.   PT Next Visit Plan Porgress core and LE strength   Consulted and Agree with Plan of Care Patient      Patient will benefit from skilled therapeutic intervention in order to improve the following deficits and impairments:  Abnormal gait, Cardiopulmonary status limiting activity, Decreased activity tolerance, Decreased mobility, Decreased endurance, Decreased range of motion, Decreased strength, Difficulty walking, Impaired flexibility, Impaired sensation, Improper body mechanics, Pain, Impaired UE functional use  Visit Diagnosis: Muscle weakness (generalized)  Repeated falls  Difficulty in walking, not elsewhere classified  Other lack of coordination     Problem List Patient Active Problem List   Diagnosis Date Noted  . Encounter for preventive health examination 02/17/2016  . Morbid obesity  ([place]) 06/17/2015  . Fall at home 12/01/2014  . Hypomagnesemia 04/24/2014  . Hemiparesis affecting left side as late effect of cerebrovascular accident ([place]) 04/24/2014  . Nontraumatic cerebral hemorrhage ([place]) 04/30/2012  . Diabetes type 2, controlled ([place]) 03/04/2010  . OSTEOPENIA 01/21/2009  . UNSPECIFIED VITAMIN D DEFICIENCY 11/19/2007  . ESSENTIAL HYPERTENSION, BENIGN 11/19/2007  . HYPERCHOLESTEROLEMIA 10/25/2006  . GASTROESOPHAGEAL REFLUX, NO ESOPHAGITIS 10/25/2006  . DIVERTICULOSIS OF COLON 10/25/2006  . Osteoarthritis 10/25/2006  . CERVICAL SPINE DISORDER, NOS 10/25/2006    Sumner Boast., PT 10/24/2016, 10:49 AM  [facility] [place] [address] [place], [place], [address] Phone: [phone]   Fax:  [phone]  Name: Karen Jackson MRN: [id number] Date of Birth: [birth year]

## 2016-10-27 ENCOUNTER — Encounter: Payer: Self-pay | Admitting: Physical Therapy

## 2016-10-27 ENCOUNTER — Ambulatory Visit: Payer: Medicare Other | Attending: Family Medicine | Admitting: Physical Therapy

## 2016-10-27 DIAGNOSIS — M6281 Muscle weakness (generalized): Secondary | ICD-10-CM | POA: Insufficient documentation

## 2016-10-27 DIAGNOSIS — R296 Repeated falls: Secondary | ICD-10-CM | POA: Diagnosis not present

## 2016-10-27 DIAGNOSIS — R278 Other lack of coordination: Secondary | ICD-10-CM | POA: Insufficient documentation

## 2016-10-27 DIAGNOSIS — R262 Difficulty in walking, not elsewhere classified: Secondary | ICD-10-CM

## 2016-10-27 NOTE — Therapy (Signed)
[place], [place], [address] Phone: [phone]   Fax:  [phone]  Physical Therapy Treatment  Patient Details  Name: Karen Jackson MRN: [id number] Date of Birth: [birth year] Referring Provider: Andria Frames  Encounter Date: 10/27/2016      PT End of Session - 10/27/16 1044    Visit Number 16   Date for PT Re-Evaluation 10/31/15   PT Start Time 1005   PT Stop Time 1052   PT Time Calculation (min) 47 min   Activity Tolerance Patient tolerated treatment well   Behavior During Therapy [facility] for tasks assessed/performed      Past Medical History:  Diagnosis Date  . Arthritis   . Hyperlipidemia   . Hypertension   . Osteopenia     Past Surgical History:  Procedure Laterality Date  . ANKLE SURGERY    . APPENDECTOMY    . CHOLECYSTECTOMY    . HERNIA REPAIR     Esophagus  . JOINT REPLACEMENT     total- right partial- left  . MASTECTOMY PARTIAL / LUMPECTOMY  2012   left    There were no vitals filed for this visit.      Subjective Assessment - 10/27/16 1035    Subjective reports that she had a good day Tuesday and Wednesday, she reports that yesterday she tried some exercises on her own and now has pain in ht eleft knee   Currently in Pain? Yes   Pain Score 7    Pain Location Knee   Pain Orientation Left   Pain Descriptors / Indicators Sore   Pain Type Chronic pain                         [facility] Adult PT Treatment/Exercise - 10/27/16 0001      Ambulation/Gait   Gait Comments HHA 200 feet, cues to pick up feet, she needed some rest today as the left leg was buckling a little today     High Level Balance   High Level Balance Comments weight shifts , standing reaching     Knee/Hip Exercises: Aerobic   Tread Mill 1.0 mph x 6 minutes     Knee/Hip Exercises: Machines for Strengthening   Cybex Leg Press 20# 2x10 with cues for control of the left knee, then no weight left  leg only     Knee/Hip Exercises: Seated   Other Seated Knee/Hip Exercises LAQ an SAQ's 3# 3 sets 10 focus on TKE hold 3 sec   Sit to Sand 5 reps;2 sets;without UE support     Knee/Hip Exercises: Supine   Straight Leg Raises 2 sets;15 reps   Other Supine Knee/Hip Exercises red tband DF and eversion, 2# left hand punches and flexion, blue tband hip extension, 2# SAQ's     Knee/Hip Exercises: Sidelying   Clams 2x10   Other Sidelying Knee/Hip Exercises blue tband hip extension, sliding board hip abduction     Knee/Hip Exercises: Prone   Hamstring Curl 2 sets;10 reps                  PT Short Term Goals - 09/19/16 1255      PT SHORT TERM GOAL #1   Title independent with initial HEP   Status Achieved           PT Long Term Goals - 10/27/16 1053      PT LONG TERM GOAL #2  Title increased right LE strength to 4-/5   Status Partially Met     PT LONG TERM GOAL #3   Title increased left LE strength to 4-/5 for the hip and knee   Status Partially Met               Plan - 10/27/16 1052    Clinical Impression Statement Patient had very poor control of the left knee and LE today, needed a lot of cues.  I asked her to decrease the weight that she was trying at home.   PT Next Visit Plan Porgress core and LE strength   Consulted and Agree with Plan of Care Patient      Patient will benefit from skilled therapeutic intervention in order to improve the following deficits and impairments:  Abnormal gait, Cardiopulmonary status limiting activity, Decreased activity tolerance, Decreased mobility, Decreased endurance, Decreased range of motion, Decreased strength, Difficulty walking, Impaired flexibility, Impaired sensation, Improper body mechanics, Pain, Impaired UE functional use  Visit Diagnosis: Muscle weakness (generalized)  Repeated falls  Difficulty in walking, not elsewhere classified  Other lack of coordination     Problem List Patient Active  Problem List   Diagnosis Date Noted  . Encounter for preventive health examination 02/17/2016  . Morbid obesity ([place]) 06/17/2015  . Fall at home 12/01/2014  . Hypomagnesemia 04/24/2014  . Hemiparesis affecting left side as late effect of cerebrovascular accident ([place]) 04/24/2014  . Nontraumatic cerebral hemorrhage ([place]) 04/30/2012  . Diabetes type 2, controlled ([place]) 03/04/2010  . OSTEOPENIA 01/21/2009  . UNSPECIFIED VITAMIN D DEFICIENCY 11/19/2007  . ESSENTIAL HYPERTENSION, BENIGN 11/19/2007  . HYPERCHOLESTEROLEMIA 10/25/2006  . GASTROESOPHAGEAL REFLUX, NO ESOPHAGITIS 10/25/2006  . DIVERTICULOSIS OF COLON 10/25/2006  . Osteoarthritis 10/25/2006  . CERVICAL SPINE DISORDER, NOS 10/25/2006    Sumner Boast., PT 10/27/2016, 10:55 AM  [place] [address] [place], [place], [address] Phone: [phone]   Fax:  [phone]  Name: LULABELLE DESTA MRN: [id number] Date of Birth: [birth year]

## 2016-10-30 DIAGNOSIS — M25552 Pain in left hip: Secondary | ICD-10-CM | POA: Diagnosis not present

## 2016-10-30 DIAGNOSIS — M7072 Other bursitis of hip, left hip: Secondary | ICD-10-CM | POA: Diagnosis not present

## 2016-10-31 ENCOUNTER — Ambulatory Visit: Payer: Medicare Other | Admitting: Physical Therapy

## 2016-11-02 ENCOUNTER — Encounter: Payer: PRIVATE HEALTH INSURANCE | Admitting: Physical Therapy

## 2016-11-03 ENCOUNTER — Encounter: Payer: Self-pay | Admitting: Physical Therapy

## 2016-11-03 ENCOUNTER — Ambulatory Visit: Payer: Medicare Other | Admitting: Physical Therapy

## 2016-11-03 DIAGNOSIS — R296 Repeated falls: Secondary | ICD-10-CM | POA: Diagnosis not present

## 2016-11-03 DIAGNOSIS — R262 Difficulty in walking, not elsewhere classified: Secondary | ICD-10-CM

## 2016-11-03 DIAGNOSIS — M6281 Muscle weakness (generalized): Secondary | ICD-10-CM

## 2016-11-03 DIAGNOSIS — R278 Other lack of coordination: Secondary | ICD-10-CM | POA: Diagnosis not present

## 2016-11-03 NOTE — Therapy (Signed)
[place], [place], [address] Phone: [phone]   Fax:  [phone]  Physical Therapy Treatment  Patient Details  Name: Karen Jackson MRN: [id number] Date of Birth: 01/30/1941 Referring Provider: Andria Frames  Encounter Date: 11/03/2016      PT End of Session - 11/03/16 1043    Visit Number 17   Date for PT Re-Evaluation 11/30/16   PT Start Time 1005   PT Stop Time 1050   PT Time Calculation (min) 45 min   Activity Tolerance Patient tolerated treatment well   Behavior During Therapy [facility] for tasks assessed/performed      Past Medical History:  Diagnosis Date  . Arthritis   . Hyperlipidemia   . Hypertension   . Osteopenia     Past Surgical History:  Procedure Laterality Date  . ANKLE SURGERY    . APPENDECTOMY    . CHOLECYSTECTOMY    . HERNIA REPAIR     Esophagus  . JOINT REPLACEMENT     total- right partial- left  . MASTECTOMY PARTIAL / LUMPECTOMY  2012   left    There were no vitals filed for this visit.      Subjective Assessment - 11/03/16 1035    Subjective She had a fall on Sunday, reports that her left leg gave out.  She saw MD Monday and had a cortisone injection in the left hip.   Currently in Pain? Yes   Pain Score 4    Pain Location Knee   Pain Orientation Left   Pain Descriptors / Indicators Sore                         [facility] Adult PT Treatment/Exercise - 11/03/16 0001      Knee/Hip Exercises: Aerobic   Tread Mill 1.0 mph x 6 minutes     Knee/Hip Exercises: Standing   Other Standing Knee Exercises standing 4" and 6" toe touches, SLS x 3 seconds with her holding a quad cane     Knee/Hip Exercises: Seated   Other Seated Knee/Hip Exercises LAQ an SAQ's 3# 3 sets 10 focus on TKE hold 3 sec   Sit to Sand 5 reps;2 sets;without UE support     Knee/Hip Exercises: Supine   Straight Leg Raises 2 sets;10 reps  with 3#   Other Supine Knee/Hip Exercises  red tband DF and eversion, 2# left hand punches and flexion, blue tband hip extension, 2# SAQ's   Other Supine Knee/Hip Exercises partial sit ups with her reaching with the left arm,  tband leg pull downs, ball reaching in supine over head with both hands, bridges, supine with her holding tband and doing trunk rotations                  PT Short Term Goals - 09/19/16 1255      PT SHORT TERM GOAL #1   Title independent with initial HEP   Status Achieved           PT Long Term Goals - 11/03/16 1045      PT LONG TERM GOAL #1   Title decrease timed up and go test to 25 seconds   Status Partially Met     PT LONG TERM GOAL #2   Title increased right LE strength to 4-/5   Status Partially Met     PT LONG TERM GOAL #3   Title increased left LE strength  to 4-/5 for the hip and knee   Status Partially Met     PT LONG TERM GOAL #4   Title no falls over a 6 week period   Status On-going     PT LONG TERM GOAL #5   Title walk 600 feet with supervision and FWW   Status On-going               Plan - 11/03/16 1044    Clinical Impression Statement Patient has continued to have poor left LE control, she had the fall Sunday night and has now returned to increased fear and does not want to bear weight on the left LE.  The MD on Monday felt like she had left hip bursitis.   PT Frequency 2x / week   PT Duration 4 weeks   PT Next Visit Plan Porgress core and LE strength   Consulted and Agree with Plan of Care Patient      Patient will benefit from skilled therapeutic intervention in order to improve the following deficits and impairments:  Abnormal gait, Cardiopulmonary status limiting activity, Decreased activity tolerance, Decreased mobility, Decreased endurance, Decreased range of motion, Decreased strength, Difficulty walking, Impaired flexibility, Impaired sensation, Improper body mechanics, Pain, Impaired UE functional use  Visit Diagnosis: Muscle weakness  (generalized) - Plan: PT plan of care cert/re-cert  Repeated falls - Plan: PT plan of care cert/re-cert  Difficulty in walking, not elsewhere classified - Plan: PT plan of care cert/re-cert  Other lack of coordination - Plan: PT plan of care cert/re-cert     Problem List Patient Active Problem List   Diagnosis Date Noted  . Encounter for preventive health examination 02/17/2016  . Morbid obesity ([place]) 06/17/2015  . Fall at home 12/01/2014  . Hypomagnesemia 04/24/2014  . Hemiparesis affecting left side as late effect of cerebrovascular accident ([place]) 04/24/2014  . Nontraumatic cerebral hemorrhage ([place]) 04/30/2012  . Diabetes type 2, controlled ([place]) 03/04/2010  . OSTEOPENIA 01/21/2009  . UNSPECIFIED VITAMIN D DEFICIENCY 11/19/2007  . ESSENTIAL HYPERTENSION, BENIGN 11/19/2007  . HYPERCHOLESTEROLEMIA 10/25/2006  . GASTROESOPHAGEAL REFLUX, NO ESOPHAGITIS 10/25/2006  . DIVERTICULOSIS OF COLON 10/25/2006  . Osteoarthritis 10/25/2006  . CERVICAL SPINE DISORDER, NOS 10/25/2006    Sumner Boast., PT 11/03/2016, 10:47 AM  [place] [address] [place], [place], [address] Phone: [phone]   Fax:  [phone]  Name: Karen Jackson MRN: [id number] Date of Birth: 03-25-1941

## 2016-11-07 ENCOUNTER — Ambulatory Visit: Payer: Medicare Other | Admitting: Physical Therapy

## 2016-11-09 DIAGNOSIS — L821 Other seborrheic keratosis: Secondary | ICD-10-CM | POA: Diagnosis not present

## 2016-11-09 DIAGNOSIS — Z85828 Personal history of other malignant neoplasm of skin: Secondary | ICD-10-CM | POA: Diagnosis not present

## 2016-11-09 DIAGNOSIS — L218 Other seborrheic dermatitis: Secondary | ICD-10-CM | POA: Diagnosis not present

## 2016-11-09 DIAGNOSIS — D1801 Hemangioma of skin and subcutaneous tissue: Secondary | ICD-10-CM | POA: Diagnosis not present

## 2016-11-09 DIAGNOSIS — D692 Other nonthrombocytopenic purpura: Secondary | ICD-10-CM | POA: Diagnosis not present

## 2016-11-10 ENCOUNTER — Encounter: Payer: Self-pay | Admitting: Physical Therapy

## 2016-11-10 ENCOUNTER — Ambulatory Visit: Payer: Medicare Other | Admitting: Physical Therapy

## 2016-11-10 DIAGNOSIS — M6281 Muscle weakness (generalized): Secondary | ICD-10-CM | POA: Diagnosis not present

## 2016-11-10 DIAGNOSIS — R262 Difficulty in walking, not elsewhere classified: Secondary | ICD-10-CM | POA: Diagnosis not present

## 2016-11-10 DIAGNOSIS — R296 Repeated falls: Secondary | ICD-10-CM

## 2016-11-10 DIAGNOSIS — R278 Other lack of coordination: Secondary | ICD-10-CM | POA: Diagnosis not present

## 2016-11-10 NOTE — Therapy (Signed)
[place], [place], [address] Phone: [phone]   Fax:  [phone]  Physical Therapy Treatment  Patient Details  Name: Karen Jackson MRN: [id number] Date of Birth: 07-Jul-1941 Referring Provider: Andria Frames  Encounter Date: 11/10/2016      PT End of Session - 11/10/16 1054    Visit Number 18   Date for PT Re-Evaluation 11/30/16   PT Start Time 1008   PT Stop Time 1056   PT Time Calculation (min) 48 min   Activity Tolerance Patient tolerated treatment well   Behavior During Therapy [facility] for tasks assessed/performed      Past Medical History:  Diagnosis Date  . Arthritis   . Hyperlipidemia   . Hypertension   . Osteopenia     Past Surgical History:  Procedure Laterality Date  . ANKLE SURGERY    . APPENDECTOMY    . CHOLECYSTECTOMY    . HERNIA REPAIR     Esophagus  . JOINT REPLACEMENT     total- right partial- left  . MASTECTOMY PARTIAL / LUMPECTOMY  2012   left    There were no vitals filed for this visit.      Subjective Assessment - 11/10/16 1025    Subjective No falls, reports that she had to miss due to snow, reports feeling very stiff and unsteady today                         [facility] Adult PT Treatment/Exercise - 11/10/16 0001      High Level Balance   High Level Balance Activities Side stepping;Backward walking   High Level Balance Comments weight shifts , standing reaching     Knee/Hip Exercises: Aerobic   Tread Mill 1.0 mph x 6 minutes     Knee/Hip Exercises: Standing   Other Standing Knee Exercises standing 4" and 6" toe touches, SLS x 3 seconds with her holding a quad cane, a lot of cues for posture and better form and good motions   Other Standing Knee Exercises trunk rotation with tband , 2.5# hip flexion, hip abduction     Knee/Hip Exercises: Supine   Other Supine Knee/Hip Exercises red tband DF and eversion, 2# left hand punches and flexion,  blue tband hip extension, 2# SAQ's     Knee/Hip Exercises: Sidelying   Other Sidelying Knee/Hip Exercises blue tband hip extension, sliding board hip abduction     Knee/Hip Exercises: Prone   Hamstring Curl 2 sets;10 reps                  PT Short Term Goals - 09/19/16 1255      PT SHORT TERM GOAL #1   Title independent with initial HEP   Status Achieved           PT Long Term Goals - 11/10/16 1056      PT LONG TERM GOAL #1   Title decrease timed up and go test to 25 seconds   Status On-going     PT LONG TERM GOAL #4   Title no falls over a 6 week period   Status On-going               Plan - 11/10/16 1054    Clinical Impression Statement Patient with a week off again regresses to the point wehre she does not control the left LE and the left UE is more spastic, she reports that  she tries to do exercises but she does not concentrate enough to get the mms active   PT Next Visit Plan Porgress core and LE strength   Consulted and Agree with Plan of Care Patient      Patient will benefit from skilled therapeutic intervention in order to improve the following deficits and impairments:  Abnormal gait, Cardiopulmonary status limiting activity, Decreased activity tolerance, Decreased mobility, Decreased endurance, Decreased range of motion, Decreased strength, Difficulty walking, Impaired flexibility, Impaired sensation, Improper body mechanics, Pain, Impaired UE functional use  Visit Diagnosis: Muscle weakness (generalized)  Repeated falls  Difficulty in walking, not elsewhere classified     Problem List Patient Active Problem List   Diagnosis Date Noted  . Encounter for preventive health examination 02/17/2016  . Morbid obesity ([place]) 06/17/2015  . Fall at home 12/01/2014  . Hypomagnesemia 04/24/2014  . Hemiparesis affecting left side as late effect of cerebrovascular accident ([place]) 04/24/2014  . Nontraumatic cerebral hemorrhage ([place]) 04/30/2012   . Diabetes type 2, controlled ([place]) 03/04/2010  . OSTEOPENIA 01/21/2009  . UNSPECIFIED VITAMIN D DEFICIENCY 11/19/2007  . ESSENTIAL HYPERTENSION, BENIGN 11/19/2007  . HYPERCHOLESTEROLEMIA 10/25/2006  . GASTROESOPHAGEAL REFLUX, NO ESOPHAGITIS 10/25/2006  . DIVERTICULOSIS OF COLON 10/25/2006  . Osteoarthritis 10/25/2006  . CERVICAL SPINE DISORDER, NOS 10/25/2006    Sumner Boast., PT 11/10/2016, 10:57 AM  [place] [address] [place], [place], [address] Phone: [phone]   Fax:  [phone]  Name: Karen Jackson MRN: [id number] Date of Birth: [birth year]

## 2016-11-14 ENCOUNTER — Ambulatory Visit: Payer: Medicare Other | Admitting: Physical Therapy

## 2016-11-15 ENCOUNTER — Ambulatory Visit: Payer: Medicare Other | Admitting: Physical Therapy

## 2016-11-15 ENCOUNTER — Encounter: Payer: Self-pay | Admitting: Physical Therapy

## 2016-11-15 DIAGNOSIS — R278 Other lack of coordination: Secondary | ICD-10-CM | POA: Diagnosis not present

## 2016-11-15 DIAGNOSIS — R296 Repeated falls: Secondary | ICD-10-CM | POA: Diagnosis not present

## 2016-11-15 DIAGNOSIS — R262 Difficulty in walking, not elsewhere classified: Secondary | ICD-10-CM | POA: Diagnosis not present

## 2016-11-15 DIAGNOSIS — M6281 Muscle weakness (generalized): Secondary | ICD-10-CM | POA: Diagnosis not present

## 2016-11-15 NOTE — Therapy (Signed)
[place], [place], [address] Phone: [phone]   Fax:  [phone]  Physical Therapy Treatment  Patient Details  Name: Karen Jackson MRN: [id number] Date of Birth: November 22, 1940 Referring Provider: Andria Frames  Encounter Date: 11/15/2016      PT End of Session - 11/15/16 1045    Visit Number 19   Date for PT Re-Evaluation 11/30/16   PT Start Time 0924   PT Stop Time 1010   PT Time Calculation (min) 46 min   Activity Tolerance Patient tolerated treatment well   Behavior During Therapy [facility] for tasks assessed/performed      Past Medical History:  Diagnosis Date  . Arthritis   . Hyperlipidemia   . Hypertension   . Osteopenia     Past Surgical History:  Procedure Laterality Date  . ANKLE SURGERY    . APPENDECTOMY    . CHOLECYSTECTOMY    . HERNIA REPAIR     Esophagus  . JOINT REPLACEMENT     total- right partial- left  . MASTECTOMY PARTIAL / LUMPECTOMY  2012   left    There were no vitals filed for this visit.      Subjective Assessment - 11/15/16 1039    Subjective Patient reports no falls but reports that over the weekend and yesterday she got to the point where she was having a difficult time lifting the left leg and walking. she reports that the left knee was giving out   Currently in Pain? Yes   Pain Score 1    Pain Location Knee   Pain Orientation Left                         [facility] Adult PT Treatment/Exercise - 11/15/16 0001      Ambulation/Gait   Gait Comments HHA 200 feet x1, cue to pick up feet     High Level Balance   High Level Balance Activities Side stepping;Backward walking   High Level Balance Comments weight shifts , standing reaching     Knee/Hip Exercises: Aerobic   Tread Mill 1.0 mph x 6 minutes     Knee/Hip Exercises: Machines for Strengthening   Cybex Leg Press 20# 2x10 with cues for control of the left knee, then no weight left leg only      Knee/Hip Exercises: Standing   Other Standing Knee Exercises standing 4" toe clears holding a WBQC and a walker.   Other Standing Knee Exercises trunk rotation with tband , 2.5# hip flexion, hip abduction     Knee/Hip Exercises: Seated   Other Seated Knee/Hip Exercises LAQ an SAQ's 3# 3 sets 10 focus on TKE hold 3 sec     Knee/Hip Exercises: Supine   Other Supine Knee/Hip Exercises red tband DF and eversion, 2# left hand punches and flexion, blue tband hip extension, 2# SAQ's and trunk rotations   Other Supine Knee/Hip Exercises partial sit ups with her reaching with the left arm,  tband leg pull downs, ball reaching in supine over head with both hands, bridges, supine with her holding tband and doing trunk rotations, 1, 2 and 3# left arm presses needing cues to stabilize and control the motions     Knee/Hip Exercises: Sidelying   Clams 2x10     Knee/Hip Exercises: Prone   Hamstring Curl 2 sets;10 reps   Hamstring Curl Limitations with red tband sitting  PT Short Term Goals - 09/19/16 1255      PT SHORT TERM GOAL #1   Title independent with initial HEP   Status Achieved           PT Long Term Goals - 11/15/16 1048      PT LONG TERM GOAL #2   Title increased right LE strength to 4-/5   Status Partially Met     PT LONG TERM GOAL #5   Title walk 600 feet with supervision and FWW   Status Partially Met               Plan - 11/15/16 1045    Clinical Impression Statement Patient today with decresaed left leg and specifically knee control, as she was doing the toe clears the left knee would start bending  and without cues she would get lower and lower.  Needs cues to concentrate on the task or she will lose the ability   PT Next Visit Plan have her really focus on the tasks   Consulted and Agree with Plan of Care Patient      Patient will benefit from skilled therapeutic intervention in order to improve the following deficits and  impairments:  Abnormal gait, Cardiopulmonary status limiting activity, Decreased activity tolerance, Decreased mobility, Decreased endurance, Decreased range of motion, Decreased strength, Difficulty walking, Impaired flexibility, Impaired sensation, Improper body mechanics, Pain, Impaired UE functional use  Visit Diagnosis: Muscle weakness (generalized)  Repeated falls  Difficulty in walking, not elsewhere classified  Other lack of coordination     Problem List Patient Active Problem List   Diagnosis Date Noted  . Encounter for preventive health examination 02/17/2016  . Morbid obesity ([place]) 06/17/2015  . Fall at home 12/01/2014  . Hypomagnesemia 04/24/2014  . Hemiparesis affecting left side as late effect of cerebrovascular accident ([place]) 04/24/2014  . Nontraumatic cerebral hemorrhage ([place]) 04/30/2012  . Diabetes type 2, controlled ([place]) 03/04/2010  . OSTEOPENIA 01/21/2009  . UNSPECIFIED VITAMIN D DEFICIENCY 11/19/2007  . ESSENTIAL HYPERTENSION, BENIGN 11/19/2007  . HYPERCHOLESTEROLEMIA 10/25/2006  . GASTROESOPHAGEAL REFLUX, NO ESOPHAGITIS 10/25/2006  . DIVERTICULOSIS OF COLON 10/25/2006  . Osteoarthritis 10/25/2006  . CERVICAL SPINE DISORDER, NOS 10/25/2006    Sumner Boast., PT 11/15/2016, 10:48 AM  [facility] [place] [address] [place], [place], [address] Phone: [phone]   Fax:  [phone]  Name: MANUEL LAWHEAD MRN: [id number] Date of Birth: 1940-09-15

## 2016-11-17 ENCOUNTER — Ambulatory Visit: Payer: Medicare Other | Admitting: Physical Therapy

## 2016-11-17 ENCOUNTER — Encounter: Payer: Self-pay | Admitting: Physical Therapy

## 2016-11-17 DIAGNOSIS — R262 Difficulty in walking, not elsewhere classified: Secondary | ICD-10-CM | POA: Diagnosis not present

## 2016-11-17 DIAGNOSIS — M6281 Muscle weakness (generalized): Secondary | ICD-10-CM

## 2016-11-17 DIAGNOSIS — R296 Repeated falls: Secondary | ICD-10-CM

## 2016-11-17 DIAGNOSIS — R278 Other lack of coordination: Secondary | ICD-10-CM | POA: Diagnosis not present

## 2016-11-17 NOTE — Therapy (Signed)
[employer] [place], [place], [address] Phone: [phone]   Fax:  [phone]  Physical Therapy Treatment  Patient Details  Name: Karen Jackson MRN: [id number] Date of Birth: September 18, 1940 Referring Provider: Andria Frames  Encounter Date: 11/17/2016      PT End of Session - 11/17/16 1148    Visit Number 20   Date for PT Re-Evaluation 11/30/16   PT Start Time 1004   PT Stop Time 1049   PT Time Calculation (min) 45 min   Activity Tolerance Patient limited by fatigue   Behavior During Therapy [facility] for tasks assessed/performed      Past Medical History:  Diagnosis Date  . Arthritis   . Hyperlipidemia   . Hypertension   . Osteopenia     Past Surgical History:  Procedure Laterality Date  . ANKLE SURGERY    . APPENDECTOMY    . CHOLECYSTECTOMY    . HERNIA REPAIR     Esophagus  . JOINT REPLACEMENT     total- right partial- left  . MASTECTOMY PARTIAL / LUMPECTOMY  2012   left    There were no vitals filed for this visit.      Subjective Assessment - 11/17/16 1040    Subjective (P)  Not moving good, just feel heavy and stiff                         [facility] Adult PT Treatment/Exercise - 11/17/16 0001      Ambulation/Gait   Gait Comments HHA 200 feet x1, cue to pick up feet     High Level Balance   High Level Balance Activities Side stepping;Backward walking   High Level Balance Comments weight shifts , standing reaching     Knee/Hip Exercises: Aerobic   Tread Mill 1.0 mph x 5 minutes, patient much more short of breath today, poor posture, needing cues to dtand up and could not go past 5 minutes     Knee/Hip Exercises: Standing   Other Standing Knee Exercises standing 4" toe clears holding a WBQC and a walker.   Other Standing Knee Exercises trunk rotation with tband , 2.5# hip flexion, hip abduction, then supine trunk rotation with 5# weight on stick and chest press and shoulder  flexion     Knee/Hip Exercises: Seated   Other Seated Knee/Hip Exercises LAQ an SAQ's 3# 3 sets 10 focus on TKE hold 3 sec   Sit to Sand 5 reps;2 sets;without UE support  a lot of help with putting weight through the left leg     Knee/Hip Exercises: Supine   Other Supine Knee/Hip Exercises partial sit ups with her reaching with the left arm,  tband leg pull downs, ball reaching in supine over head with both hands, bridges, supine with her holding tband and doing trunk rotations, 1, 2 and 3# left arm presses needing cues to stabilize and control the motions, supine LE trunk rotation     Knee/Hip Exercises: Sidelying   Clams 2x10                  PT Short Term Goals - 09/19/16 1255      PT SHORT TERM GOAL #1   Title independent with initial HEP   Status Achieved           PT Long Term Goals - 11/15/16 1048      PT LONG TERM GOAL #2   Title  increased right LE strength to 4-/5   Status Partially Met     PT LONG TERM GOAL #5   Title walk 600 feet with supervision and FWW   Status Partially Met               Plan - 2016-12-13 1152    Clinical Impression Statement Patient worse today with the left LE control which added to her decreased trunk posture, she was much more forward flexed, had difficulty correcting.  Was limited in her ability to walk.   PT Next Visit Plan have her really focus on the tasks   Consulted and Agree with Plan of Care Patient      Patient will benefit from skilled therapeutic intervention in order to improve the following deficits and impairments:  Abnormal gait, Cardiopulmonary status limiting activity, Decreased activity tolerance, Decreased mobility, Decreased endurance, Decreased range of motion, Decreased strength, Difficulty walking, Impaired flexibility, Impaired sensation, Improper body mechanics, Pain, Impaired UE functional use  Visit Diagnosis: Muscle weakness (generalized)  Repeated falls  Difficulty in walking, not  elsewhere classified  Other lack of coordination       G-Codes - 12-13-2016 1155    Functional Assessment Tool Used (Outpatient Only) foto 60% limited   Functional Limitation Mobility: Walking and moving around   Mobility: Walking and Moving Around Current Status [phone]) At least 60 percent but less than 80 percent impaired, limited or restricted   Mobility: Walking and Moving Around Goal Status [phone]) At least 60 percent but less than 80 percent impaired, limited or restricted      Problem List Patient Active Problem List   Diagnosis Date Noted  . Encounter for preventive health examination 02/17/2016  . Morbid obesity ([place]) 06/17/2015  . Fall at home 12/01/2014  . Hypomagnesemia 04/24/2014  . Hemiparesis affecting left side as late effect of cerebrovascular accident ([place]) 04/24/2014  . Nontraumatic cerebral hemorrhage ([place]) 04/30/2012  . Diabetes type 2, controlled ([place]) 03/04/2010  . OSTEOPENIA 01/21/2009  . UNSPECIFIED VITAMIN D DEFICIENCY 11/19/2007  . ESSENTIAL HYPERTENSION, BENIGN 11/19/2007  . HYPERCHOLESTEROLEMIA 10/25/2006  . GASTROESOPHAGEAL REFLUX, NO ESOPHAGITIS 10/25/2006  . DIVERTICULOSIS OF COLON 10/25/2006  . Osteoarthritis 10/25/2006  . CERVICAL SPINE DISORDER, NOS 10/25/2006    Sumner Boast., PT 13-Dec-2016, 11:56 AM  [place] [address] [place], [place], [address] Phone: [phone]   Fax:  [phone]  Name: Karen Jackson MRN: [id number] Date of Birth: 1941/01/03

## 2016-11-21 ENCOUNTER — Ambulatory Visit: Payer: Medicare Other | Admitting: Physical Therapy

## 2016-11-21 ENCOUNTER — Encounter: Payer: Self-pay | Admitting: Physical Therapy

## 2016-11-21 DIAGNOSIS — R278 Other lack of coordination: Secondary | ICD-10-CM

## 2016-11-21 DIAGNOSIS — R296 Repeated falls: Secondary | ICD-10-CM | POA: Diagnosis not present

## 2016-11-21 DIAGNOSIS — R262 Difficulty in walking, not elsewhere classified: Secondary | ICD-10-CM

## 2016-11-21 DIAGNOSIS — M6281 Muscle weakness (generalized): Secondary | ICD-10-CM | POA: Diagnosis not present

## 2016-11-21 NOTE — Therapy (Signed)
[place], [place], [address] Phone: [phone]   Fax:  [phone]  Physical Therapy Treatment  Patient Details  Name: Karen Jackson MRN: [id number] Date of Birth: 07-29-1941 Referring Provider: Andria Frames  Encounter Date: 11/21/2016      PT End of Session - 11/21/16 1101    Visit Number 21   Date for PT Re-Evaluation 11/30/16   PT Start Time 1006   PT Stop Time 1055   PT Time Calculation (min) 49 min   Activity Tolerance Patient tolerated treatment well   Behavior During Therapy [facility] for tasks assessed/performed      Past Medical History:  Diagnosis Date  . Arthritis   . Hyperlipidemia   . Hypertension   . Osteopenia     Past Surgical History:  Procedure Laterality Date  . ANKLE SURGERY    . APPENDECTOMY    . CHOLECYSTECTOMY    . HERNIA REPAIR     Esophagus  . JOINT REPLACEMENT     total- right partial- left  . MASTECTOMY PARTIAL / LUMPECTOMY  2012   left    There were no vitals filed for this visit.      Subjective Assessment - 11/21/16 1058    Subjective Patient reports that after the last visit she is feeling better and moving better, she does report that last time she was here and was doing poorly that she had taken two statin drugs   Currently in Pain? Yes   Pain Score 1    Pain Location Knee   Pain Orientation Left                         [facility] Adult PT Treatment/Exercise - 11/21/16 0001      Ambulation/Gait   Gait Comments HHA 200 feet x1, cue to pick up feet     High Level Balance   High Level Balance Activities Side stepping;Backward walking     Knee/Hip Exercises: Aerobic   Tread Mill 1.0 mph x 6 minutes      Knee/Hip Exercises: Machines for Strengthening   Cybex Knee Extension 5# left leg only 2x10   Cybex Knee Flexion 10# left only 2x10   Cybex Leg Press 20# 2x10 with cues for control of the left knee, then no weight left leg only   Other Machine standing hip flexion and hip abduction 2# bilaterally 2x10 each     Knee/Hip Exercises: Standing   Other Standing Knee Exercises standing 4" toe clears holding a WBQC and a walker.   Other Standing Knee Exercises trunk rotation with tband , 2.5# hip flexion, hip abduction, then supine trunk rotation with 5# weight on stick and chest press and shoulder flexion     Knee/Hip Exercises: Seated   Other Seated Knee/Hip Exercises LAQ an SAQ's 3# 3 sets 10 focus on TKE hold 3 sec, SLR's 2# left only     Knee/Hip Exercises: Supine   Other Supine Knee/Hip Exercises partial sit ups with her reaching with the left arm,  tband leg pull downs, ball reaching in supine over head with both hands, bridges, supine with her holding tband and doing trunk rotations, 1, 2 and 3# left arm presses needing cues to stabilize and control the motions, supine LE trunk rotation     Knee/Hip Exercises: Sidelying   Clams 2x10  PT Short Term Goals - 09/19/16 1255      PT SHORT TERM GOAL #1   Title independent with initial HEP   Status Achieved           PT Long Term Goals - 11/21/16 1103      PT LONG TERM GOAL #2   Title increased right LE strength to 4-/5   Status Partially Met     PT LONG TERM GOAL #4   Title no falls over a 6 week period   Status Partially Met               Plan - 11/21/16 1101    Clinical Impression Statement Improved control of the left LE, she does tend to still neglect the left and can correct with verbal cues.  She is very weak and can be unsafe when tired   PT Next Visit Plan have her really focus on the tasks   Consulted and Agree with Plan of Care Patient      Patient will benefit from skilled therapeutic intervention in order to improve the following deficits and impairments:  Abnormal gait, Cardiopulmonary status limiting activity, Decreased activity tolerance, Decreased mobility, Decreased endurance, Decreased range of motion,  Decreased strength, Difficulty walking, Impaired flexibility, Impaired sensation, Improper body mechanics, Pain, Impaired UE functional use  Visit Diagnosis: Muscle weakness (generalized)  Repeated falls  Difficulty in walking, not elsewhere classified  Other lack of coordination     Problem List Patient Active Problem List   Diagnosis Date Noted  . Encounter for preventive health examination 02/17/2016  . Morbid obesity ([place]) 06/17/2015  . Fall at home 12/01/2014  . Hypomagnesemia 04/24/2014  . Hemiparesis affecting left side as late effect of cerebrovascular accident ([place]) 04/24/2014  . Nontraumatic cerebral hemorrhage ([place]) 04/30/2012  . Diabetes type 2, controlled ([place]) 03/04/2010  . OSTEOPENIA 01/21/2009  . UNSPECIFIED VITAMIN D DEFICIENCY 11/19/2007  . ESSENTIAL HYPERTENSION, BENIGN 11/19/2007  . HYPERCHOLESTEROLEMIA 10/25/2006  . GASTROESOPHAGEAL REFLUX, NO ESOPHAGITIS 10/25/2006  . DIVERTICULOSIS OF COLON 10/25/2006  . Osteoarthritis 10/25/2006  . CERVICAL SPINE DISORDER, NOS 10/25/2006    Sumner Boast., PT 11/21/2016, 11:04 AM  [place] [address] [place], [place], [address] Phone: [phone]   Fax:  [phone]  Name: BRAYLEI TOTINO MRN: [id number] Date of Birth: 05-22-1941

## 2016-11-23 ENCOUNTER — Encounter: Payer: Self-pay | Admitting: Physical Therapy

## 2016-11-23 ENCOUNTER — Ambulatory Visit: Payer: Medicare Other | Admitting: Physical Therapy

## 2016-11-23 DIAGNOSIS — R296 Repeated falls: Secondary | ICD-10-CM

## 2016-11-23 DIAGNOSIS — R278 Other lack of coordination: Secondary | ICD-10-CM

## 2016-11-23 DIAGNOSIS — R262 Difficulty in walking, not elsewhere classified: Secondary | ICD-10-CM | POA: Diagnosis not present

## 2016-11-23 DIAGNOSIS — M6281 Muscle weakness (generalized): Secondary | ICD-10-CM

## 2016-11-23 NOTE — Therapy (Signed)
[place], [place], [address] Phone: [phone]   Fax:  [phone]  Physical Therapy Treatment  Patient Details  Name: Karen Jackson MRN: [id number] Date of Birth: 17-Jan-1941 Referring Provider: Andria Frames  Encounter Date: 11/23/2016      PT End of Session - 11/23/16 1455    Visit Number 22   Date for PT Re-Evaluation 11/30/16   PT Start Time 1006   PT Stop Time 1050   PT Time Calculation (min) 44 min   Activity Tolerance Patient tolerated treatment well   Behavior During Therapy [facility] for tasks assessed/performed      Past Medical History:  Diagnosis Date  . Arthritis   . Hyperlipidemia   . Hypertension   . Osteopenia     Past Surgical History:  Procedure Laterality Date  . ANKLE SURGERY    . APPENDECTOMY    . CHOLECYSTECTOMY    . HERNIA REPAIR     Esophagus  . JOINT REPLACEMENT     total- right partial- left  . MASTECTOMY PARTIAL / LUMPECTOMY  2012   left    There were no vitals filed for this visit.      Subjective Assessment - 11/23/16 1149    Subjective Patient reports that she had a good evening after PT, but the next day had a really hard time walking, felt very weak   Currently in Pain? No/denies                         [facility] Adult PT Treatment/Exercise - 11/23/16 0001      Transfers   Comments Worked with patient on transfers, using arms to push up from and not pull, worked on getting all the way up prior to turning, also workes some on bed mobility and encouraging her to use the left UE     Ambulation/Gait   Gait Comments HHA x 120 feet, then after the session 200 feet, cues to look up and to take big steps     High Level Balance   High Level Balance Activities Side stepping;Backward walking   High Level Balance Comments standing with a SBQC working on standing balance, head turns, weight shifts     Knee/Hip Exercises: Aerobic   Tread Mill 1.0  mph, only able to tolerate 4 minutes     Knee/Hip Exercises: Machines for Strengthening   Cybex Knee Extension 5# left leg only 2x10   Cybex Leg Press 20# 2x10 with cues for control of the left knee, then no weight left leg only   Other Machine standing hip flexion and hip abduction 2# bilaterally 2x10 each     Knee/Hip Exercises: Standing   Other Standing Knee Exercises standing 4" toe clears holding a WBQC and a walker., then 4" step ups   Other Standing Knee Exercises trunk rotation with tband , 2.5# hip flexion, hip abduction, then supine trunk rotation with 5# weight on stick and chest press and shoulder flexion     Knee/Hip Exercises: Seated   Other Seated Knee/Hip Exercises LAQ an SAQ's 3# 3 sets 10 focus on TKE hold 3 sec, SLR's 2# left only     Knee/Hip Exercises: Supine   Other Supine Knee/Hip Exercises partial sit ups with her reaching with the left arm,  tband leg pull downs, ball reaching in supine over head with both hands, bridges, supine with her holding tband and doing trunk rotations, 1,  2 and 3# left arm presses needing cues to stabilize and control the motions, supine LE trunk rotation, power moves in sitting                  PT Short Term Goals - 09/19/16 1255      PT SHORT TERM GOAL #1   Title independent with initial HEP   Status Achieved           PT Long Term Goals - 11/21/16 1103      PT LONG TERM GOAL #2   Title increased right LE strength to 4-/5   Status Partially Met     PT LONG TERM GOAL #4   Title no falls over a 6 week period   Status Partially Met               Plan - 11/23/16 1455    Clinical Impression Statement Patient with increased neglect of the left side today.  She struggles to extend the knees, requiring cues today, also needing cues to stand up straight, she is tending to lean forward and lean on things, due to fear and due to weakness   PT Next Visit Plan have her really focus on the tasks especially control of  the left leg   Consulted and Agree with Plan of Care Patient      Patient will benefit from skilled therapeutic intervention in order to improve the following deficits and impairments:  Abnormal gait, Cardiopulmonary status limiting activity, Decreased activity tolerance, Decreased mobility, Decreased endurance, Decreased range of motion, Decreased strength, Difficulty walking, Impaired flexibility, Impaired sensation, Improper body mechanics, Pain, Impaired UE functional use  Visit Diagnosis: Muscle weakness (generalized)  Repeated falls  Difficulty in walking, not elsewhere classified  Other lack of coordination     Problem List Patient Active Problem List   Diagnosis Date Noted  . Encounter for preventive health examination 02/17/2016  . Morbid obesity ([place]) 06/17/2015  . Fall at home 12/01/2014  . Hypomagnesemia 04/24/2014  . Hemiparesis affecting left side as late effect of cerebrovascular accident ([place]) 04/24/2014  . Nontraumatic cerebral hemorrhage ([place]) 04/30/2012  . Diabetes type 2, controlled ([place]) 03/04/2010  . OSTEOPENIA 01/21/2009  . UNSPECIFIED VITAMIN D DEFICIENCY 11/19/2007  . ESSENTIAL HYPERTENSION, BENIGN 11/19/2007  . HYPERCHOLESTEROLEMIA 10/25/2006  . GASTROESOPHAGEAL REFLUX, NO ESOPHAGITIS 10/25/2006  . DIVERTICULOSIS OF COLON 10/25/2006  . Osteoarthritis 10/25/2006  . CERVICAL SPINE DISORDER, NOS 10/25/2006    Sumner Boast., PT 11/23/2016, 2:57 PM  [place] [address] [place], [place], [address] Phone: [phone]   Fax:  [phone]  Name: TOREY REGAN MRN: [id number] Date of Birth: 05-10-1941

## 2016-11-28 ENCOUNTER — Encounter: Payer: Self-pay | Admitting: Physical Therapy

## 2016-11-28 ENCOUNTER — Ambulatory Visit: Payer: Medicare Other | Attending: Family Medicine | Admitting: Physical Therapy

## 2016-11-28 DIAGNOSIS — R296 Repeated falls: Secondary | ICD-10-CM

## 2016-11-28 DIAGNOSIS — R278 Other lack of coordination: Secondary | ICD-10-CM

## 2016-11-28 DIAGNOSIS — R293 Abnormal posture: Secondary | ICD-10-CM | POA: Insufficient documentation

## 2016-11-28 DIAGNOSIS — M6281 Muscle weakness (generalized): Secondary | ICD-10-CM

## 2016-11-28 DIAGNOSIS — R262 Difficulty in walking, not elsewhere classified: Secondary | ICD-10-CM | POA: Diagnosis not present

## 2016-11-28 NOTE — Therapy (Signed)
[place], [place], [address] Phone: [phone]   Fax:  [phone]  Physical Therapy Treatment  Patient Details  Name: Karen Jackson MRN: [id number] Date of Birth: 08-16-1941 Referring Provider: Andria Frames  Encounter Date: 11/28/2016      PT End of Session - 11/28/16 1012    Visit Number 23   Date for PT Re-Evaluation 11/30/16   PT Start Time 0930   PT Stop Time 3545   PT Time Calculation (min) 44 min   Activity Tolerance Patient tolerated treatment well   Behavior During Therapy [facility] for tasks assessed/performed      Past Medical History:  Diagnosis Date  . Arthritis   . Hyperlipidemia   . Hypertension   . Osteopenia     Past Surgical History:  Procedure Laterality Date  . ANKLE SURGERY    . APPENDECTOMY    . CHOLECYSTECTOMY    . HERNIA REPAIR     Esophagus  . JOINT REPLACEMENT     total- right partial- left  . MASTECTOMY PARTIAL / LUMPECTOMY  2012   left    There were no vitals filed for this visit.      Subjective Assessment - 11/28/16 1003    Subjective reports had a few good days after the last PT treatment   Currently in Pain? No/denies                         [facility] Adult PT Treatment/Exercise - 11/28/16 0001      Transfers   Comments Worked with patient on transfers, using arms to push up from and not pull, worked on getting all the way up prior to turning, also workes some on bed mobility and encouraging her to use the left UE     Ambulation/Gait   Gait Comments HHA x 200 feet, then after the session 200 feet, cues to look up and to take big steps     High Level Balance   High Level Balance Comments standing with a SBQC working on standing balance, head turns, weight shifts     Knee/Hip Exercises: Aerobic   Tread Mill 1.0 mph x 6 minutes, cues to decrease circumduction of the left LE     Knee/Hip Exercises: Standing   Other Standing Knee  Exercises standing 4" toe clears holding a WBQC and a walker., then 4" step ups   Other Standing Knee Exercises trunk rotation with tband , 2.5# hip flexion, hip abduction, then supine trunk rotation with 5# weight on stick and chest press and shoulder flexion     Knee/Hip Exercises: Supine   Bridges with Ball Squeeze 2 sets;15 reps   Other Supine Knee/Hip Exercises red tband DF and eversion, 2# left hand punches and flexion, blue tband hip extension, 2# SAQ's and trunk rotations   Other Supine Knee/Hip Exercises partial sit ups with her reaching with the left arm,  tband leg pull downs, ball reaching in supine over head with both hands, bridges, supine with her holding tband and doing trunk rotations, 1, 2 and 3# left arm presses needing cues to stabilize and control the motions, supine LE trunk rotation, power moves in sitting     Knee/Hip Exercises: Sidelying   Other Sidelying Knee/Hip Exercises blue tband hip extension, sliding board hip abduction                  PT Short Term Goals -  09/19/16 1255      PT SHORT TERM GOAL #1   Title independent with initial HEP   Status Achieved           PT Long Term Goals - 11/21/16 1103      PT LONG TERM GOAL #2   Title increased right LE strength to 4-/5   Status Partially Met     PT LONG TERM GOAL #4   Title no falls over a 6 week period   Status Partially Met               Plan - 11/28/16 1013    Clinical Impression Statement Patient much improved today from last visit, she had better control of the left UE and LE with less cueing.  She tolerated walking better without having to stop and rest, the left leg still fatigues easily and starts to bend at the knee and the ankle starts to roll some.   PT Next Visit Plan have her really focus on the tasks especially control of the left leg   Consulted and Agree with Plan of Care Patient      Patient will benefit from skilled therapeutic intervention in order to improve  the following deficits and impairments:  Abnormal gait, Cardiopulmonary status limiting activity, Decreased activity tolerance, Decreased mobility, Decreased endurance, Decreased range of motion, Decreased strength, Difficulty walking, Impaired flexibility, Impaired sensation, Improper body mechanics, Pain, Impaired UE functional use  Visit Diagnosis: Muscle weakness (generalized)  Repeated falls  Difficulty in walking, not elsewhere classified  Other lack of coordination     Problem List Patient Active Problem List   Diagnosis Date Noted  . Encounter for preventive health examination 02/17/2016  . Morbid obesity ([place]) 06/17/2015  . Fall at home 12/01/2014  . Hypomagnesemia 04/24/2014  . Hemiparesis affecting left side as late effect of cerebrovascular accident ([place]) 04/24/2014  . Nontraumatic cerebral hemorrhage ([place]) 04/30/2012  . Diabetes type 2, controlled ([place]) 03/04/2010  . OSTEOPENIA 01/21/2009  . UNSPECIFIED VITAMIN D DEFICIENCY 11/19/2007  . ESSENTIAL HYPERTENSION, BENIGN 11/19/2007  . HYPERCHOLESTEROLEMIA 10/25/2006  . GASTROESOPHAGEAL REFLUX, NO ESOPHAGITIS 10/25/2006  . DIVERTICULOSIS OF COLON 10/25/2006  . Osteoarthritis 10/25/2006  . CERVICAL SPINE DISORDER, NOS 10/25/2006    Sumner Boast., PT 11/28/2016, 10:14 AM  [place] [address] [address] [place], [place], [address] Phone: [phone]   Fax:  [phone]  Name: NOELLE SEASE MRN: [id number] Date of Birth: February 19, 1941

## 2016-11-30 ENCOUNTER — Encounter: Payer: Self-pay | Admitting: Family Medicine

## 2016-11-30 ENCOUNTER — Other Ambulatory Visit: Payer: Self-pay | Admitting: Family Medicine

## 2016-11-30 ENCOUNTER — Ambulatory Visit (INDEPENDENT_AMBULATORY_CARE_PROVIDER_SITE_OTHER): Payer: Medicare Other | Admitting: Family Medicine

## 2016-11-30 VITALS — BP 124/70 | HR 77 | Temp 98.0°F | Ht <= 58 in | Wt 183.0 lb

## 2016-11-30 DIAGNOSIS — E7800 Pure hypercholesterolemia, unspecified: Secondary | ICD-10-CM

## 2016-11-30 DIAGNOSIS — E8881 Metabolic syndrome: Secondary | ICD-10-CM

## 2016-11-30 DIAGNOSIS — I10 Essential (primary) hypertension: Secondary | ICD-10-CM

## 2016-11-30 DIAGNOSIS — E1121 Type 2 diabetes mellitus with diabetic nephropathy: Secondary | ICD-10-CM | POA: Diagnosis not present

## 2016-11-30 LAB — POCT GLYCOSYLATED HEMOGLOBIN (HGB A1C): HEMOGLOBIN A1C: 6.6

## 2016-11-30 NOTE — Patient Instructions (Signed)
You look marvelous. Stay on the same meds.  No changes Good job with cutting out sweets.  You diabetes test is better. See me in six months.  Sooner if problems.

## 2016-11-30 NOTE — Progress Notes (Signed)
   Subjective:    Patient ID: Karen Jackson, female    DOB: Dec 27, 1940, 76 y.o.   MRN: [id number]  HPI Six month check.  Feels great.  No complaints.  Back on physical therapy and ambulating better.  Issues: High cholesterol, on meds, due for check. Hypertension.  Tolerating meds well.  Home BPs are running great. S/P hemorrhagic CVA.  Controling BP well.  No ASA due to bleed.  Getting PT for residual left hemiparesis. Diabetes/Metabolic syndrome.  Central obesity continues.  States she has cut out sweets.  Currently diet controled.  Due for A1C.    Review of Systems No CP, SOB, appetite or bowel change     Objective:   Physical Exam VS noted Lungs clear Cardiac RRR without m or g Left hemiparesis  A1c=6.6 today        Assessment & Plan:

## 2016-11-30 NOTE — Assessment & Plan Note (Signed)
No convincing evidence that adding metformin would help.

## 2016-11-30 NOTE — Assessment & Plan Note (Signed)
On statin.  Check labs. 

## 2016-11-30 NOTE — Assessment & Plan Note (Signed)
Rec weight loss

## 2016-11-30 NOTE — Assessment & Plan Note (Signed)
Good control, check BMP

## 2016-11-30 NOTE — Assessment & Plan Note (Signed)
Nicely controlled by diet.

## 2016-12-01 LAB — BASIC METABOLIC PANEL
BUN / CREAT RATIO: 28 (ref 12–28)
BUN: 18 mg/dL (ref 8–27)
CHLORIDE: 96 mmol/L (ref 96–106)
CO2: 29 mmol/L (ref 18–29)
Calcium: 9.8 mg/dL (ref 8.7–10.3)
Creatinine, Ser: 0.64 mg/dL (ref 0.57–1.00)
GFR calc Af Amer: 100 mL/min/{1.73_m2} (ref >59)
GFR calc non Af Amer: 87 mL/min/{1.73_m2} (ref >59)
GLUCOSE: 131 mg/dL — AB (ref 65–99)
POTASSIUM: 4.6 mmol/L (ref 3.5–5.2)
Sodium: 142 mmol/L (ref 134–144)

## 2016-12-01 LAB — LIPID PANEL
CHOLESTEROL TOTAL: 198 mg/dL (ref 100–199)
Chol/HDL Ratio: 2.3 ratio (ref 0.0–4.4)
HDL: 87 mg/dL (ref >39)
LDL Calculated: 92 mg/dL (ref 0–99)
Triglycerides: 96 mg/dL (ref 0–149)
VLDL Cholesterol Cal: 19 mg/dL (ref 5–40)

## 2016-12-05 ENCOUNTER — Ambulatory Visit: Payer: Medicare Other | Admitting: Physical Therapy

## 2016-12-05 ENCOUNTER — Encounter: Payer: Self-pay | Admitting: Physical Therapy

## 2016-12-05 DIAGNOSIS — R296 Repeated falls: Secondary | ICD-10-CM | POA: Diagnosis not present

## 2016-12-05 DIAGNOSIS — M6281 Muscle weakness (generalized): Secondary | ICD-10-CM | POA: Diagnosis not present

## 2016-12-05 DIAGNOSIS — R262 Difficulty in walking, not elsewhere classified: Secondary | ICD-10-CM

## 2016-12-05 DIAGNOSIS — R278 Other lack of coordination: Secondary | ICD-10-CM | POA: Diagnosis not present

## 2016-12-05 DIAGNOSIS — R293 Abnormal posture: Secondary | ICD-10-CM

## 2016-12-05 NOTE — Therapy (Signed)
[place], [place], [address] Phone: [phone]   Fax:  [phone]  Physical Therapy Treatment  Patient Details  Name: Karen Jackson MRN: [id number] Date of Birth: 1941-06-26 Referring Provider: Andria Frames  Encounter Date: 12/05/2016      PT End of Session - 12/05/16 1052    Visit Number 24   Date for PT Re-Evaluation 01/02/17   PT Start Time 1005   PT Stop Time 1050   PT Time Calculation (min) 45 min   Activity Tolerance Patient limited by fatigue   Behavior During Therapy [facility] for tasks assessed/performed      Past Medical History:  Diagnosis Date  . Arthritis   . Hyperlipidemia   . Hypertension   . Osteopenia     Past Surgical History:  Procedure Laterality Date  . ANKLE SURGERY    . APPENDECTOMY    . CHOLECYSTECTOMY    . HERNIA REPAIR     Esophagus  . JOINT REPLACEMENT     total- right partial- left  . MASTECTOMY PARTIAL / LUMPECTOMY  2012   left    There were no vitals filed for this visit.      Subjective Assessment - 12/05/16 1047    Subjective Patient reports that she did some exercise on Friday and was feeling really good.  She reports that on Saturday with all of the rain she did not get out of her chair and has had increased difficulty walking since,  C/O less control of the left LE   Currently in Pain? No/denies                         [facility] Adult PT Treatment/Exercise - 12/05/16 0001      Transfers   Comments Worked with patient on transfers, using arms to push up from and not pull, worked on getting all the way up prior to turning, also workes some on bed mobility and encouraging her to use the left UE     Ambulation/Gait   Gait Comments HHA x 200 feet, needed a rest today, had increased difficulty with her left LE     High Level Balance   High Level Balance Comments standing with a SBQC working on standing balance, head turns, weight shifts,  some tband exercises for the UE while standing and wihtout support     Knee/Hip Exercises: Aerobic   Tread Mill 1.0 mph x 12mnutes, cues to decrease circumduction of the left LE     Knee/Hip Exercises: Machines for Strengthening   Other Machine standing hip flexion and hip abduction 2# bilaterally 2x10 each     Knee/Hip Exercises: Standing   Other Standing Knee Exercises standing 6" toe clears holding a WBQC and a walker., then 4" step ups     Knee/Hip Exercises: Supine   Other Supine Knee/Hip Exercises red tband DF and eversion, 2# left hand punches and flexion, blue tband hip extension, 2# SAQ's and trunk rotations     Knee/Hip Exercises: Sidelying   Other Sidelying Knee/Hip Exercises blue tband hip extension, sliding board hip abduction                  PT Short Term Goals - 09/19/16 1255      PT SHORT TERM GOAL #1   Title independent with initial HEP   Status Achieved           PT Long Term Goals -  12/05/16 1056      PT LONG TERM GOAL #1   Title decrease timed up and go test to 25 seconds   Status Partially Met     PT LONG TERM GOAL #2   Title increased right LE strength to 4-/5   Status Partially Met     PT LONG TERM GOAL #3   Title increased left LE strength to 4-/5 for the hip and knee   Status Partially Met     PT LONG TERM GOAL #4   Title no falls over a 6 week period   Status On-going     PT LONG TERM GOAL #5   Title walk 600 feet with supervision and FWW   Status Partially Met               Plan - 12/05/16 1052    Clinical Impression Statement Patient has up and down days, she will do very well after coming to PT but like this past weekend without PT and her not doing anything at home due to "rainy weather" she really reggresses with increased neglect and control of the left side, knee buckles and the ankle will roll.  She has difficulty trusting her balance as well, the left leg and hip will roll back and she goes into trunk flexion    PT Frequency 2x / week   PT Duration 4 weeks   PT Next Visit Plan She really needs to be more consistent with doing things at hoime and doing them correctly as well as pushing herself.   Consulted and Agree with Plan of Care Patient      Patient will benefit from skilled therapeutic intervention in order to improve the following deficits and impairments:  Abnormal gait, Cardiopulmonary status limiting activity, Decreased activity tolerance, Decreased mobility, Decreased endurance, Decreased range of motion, Decreased strength, Difficulty walking, Impaired flexibility, Impaired sensation, Improper body mechanics, Pain, Impaired UE functional use  Visit Diagnosis: Muscle weakness (generalized) - Plan: PT plan of care cert/re-cert  Repeated falls - Plan: PT plan of care cert/re-cert  Difficulty in walking, not elsewhere classified - Plan: PT plan of care cert/re-cert  Other lack of coordination - Plan: PT plan of care cert/re-cert  Abnormal posture - Plan: PT plan of care cert/re-cert     Problem List Patient Active Problem List   Diagnosis Date Noted  . Metabolic syndrome 25/36/6440  . Encounter for preventive health examination 02/17/2016  . Morbid obesity ([place]) 06/17/2015  . Fall at home 12/01/2014  . Hypomagnesemia 04/24/2014  . Hemiparesis affecting left side as late effect of cerebrovascular accident ([place]) 04/24/2014  . Nontraumatic cerebral hemorrhage ([place]) 04/30/2012  . Diabetes type 2, controlled ([place]) 03/04/2010  . OSTEOPENIA 01/21/2009  . UNSPECIFIED VITAMIN D DEFICIENCY 11/19/2007  . ESSENTIAL HYPERTENSION, BENIGN 11/19/2007  . HYPERCHOLESTEROLEMIA 10/25/2006  . GASTROESOPHAGEAL REFLUX, NO ESOPHAGITIS 10/25/2006  . DIVERTICULOSIS OF COLON 10/25/2006  . Osteoarthritis 10/25/2006  . CERVICAL SPINE DISORDER, NOS 10/25/2006    Sumner Boast., PT 12/05/2016, 10:58 AM  [address]  [place], [place], [address] Phone: [phone]   Fax:  [phone]  Name: Karen Jackson MRN: [id number] Date of Birth: 1941-06-11

## 2016-12-08 ENCOUNTER — Encounter: Payer: Self-pay | Admitting: Physical Therapy

## 2016-12-08 ENCOUNTER — Ambulatory Visit: Payer: Medicare Other | Admitting: Physical Therapy

## 2016-12-08 DIAGNOSIS — M6281 Muscle weakness (generalized): Secondary | ICD-10-CM

## 2016-12-08 DIAGNOSIS — R293 Abnormal posture: Secondary | ICD-10-CM | POA: Diagnosis not present

## 2016-12-08 DIAGNOSIS — R262 Difficulty in walking, not elsewhere classified: Secondary | ICD-10-CM

## 2016-12-08 DIAGNOSIS — R296 Repeated falls: Secondary | ICD-10-CM

## 2016-12-08 DIAGNOSIS — R278 Other lack of coordination: Secondary | ICD-10-CM

## 2016-12-08 NOTE — Therapy (Signed)
[place], [place], [address] Phone: [phone]   Fax:  [phone]  Physical Therapy Treatment  Patient Details  Name: Karen Jackson MRN: [id number] Date of Birth: 04-14-1941 Referring Provider: Andria Frames  Encounter Date: 12/08/2016      PT End of Session - 12/08/16 1157    Visit Number 25   Date for PT Re-Evaluation 01/02/17   PT Start Time 1005   PT Stop Time 1053   PT Time Calculation (min) 48 min   Activity Tolerance Patient limited by fatigue   Behavior During Therapy [facility] for tasks assessed/performed      Past Medical History:  Diagnosis Date  . Arthritis   . Hyperlipidemia   . Hypertension   . Osteopenia     Past Surgical History:  Procedure Laterality Date  . ANKLE SURGERY    . APPENDECTOMY    . CHOLECYSTECTOMY    . HERNIA REPAIR     Esophagus  . JOINT REPLACEMENT     total- right partial- left  . MASTECTOMY PARTIAL / LUMPECTOMY  2012   left    There were no vitals filed for this visit.      Subjective Assessment - 12/08/16 1150    Subjective Patient reports fatigue and lack of energy.  Reports feeling like she is not walking well.   Currently in Pain? No/denies                         [facility] Adult PT Treatment/Exercise - 12/08/16 0001      Ambulation/Gait   Gait Comments HHA 2x 180 feet, cues to pick up foot, have better posture and to relax her shoulders     High Level Balance   High Level Balance Comments standing with a SBQC working on standing balance, head turns, weight shifts, some tband exercises for the UE while standing and wihtout support, tried some left leg standing 3 seconds at a time with some assist to supoort the left knee.     Knee/Hip Exercises: Aerobic   Tread Mill 1.0 mph x 3mnutes, cues to decrease circumduction of the left LE     Knee/Hip Exercises: Standing   Other Standing Knee Exercises standing 6" toe clears holding a  WBQC and a walker., then 4" step ups   Other Standing Knee Exercises standing green tand TKE, cues to weight shift, stand up straight, standing  tbans scapoular exercises     Knee/Hip Exercises: Supine   Other Supine Knee/Hip Exercises red tband DF and eversion, 2# left hand punches and flexion, blue tband hip extension, 2# SAQ's and trunk rotations                  PT Short Term Goals - 09/19/16 1255      PT SHORT TERM GOAL #1   Title independent with initial HEP   Status Achieved           PT Long Term Goals - 12/05/16 1056      PT LONG TERM GOAL #1   Title decrease timed up and go test to 25 seconds   Status Partially Met     PT LONG TERM GOAL #2   Title increased right LE strength to 4-/5   Status Partially Met     PT LONG TERM GOAL #3   Title increased left LE strength to 4-/5 for the hip and knee   Status Partially  Met     PT LONG TERM GOAL #4   Title no falls over a 6 week period   Status On-going     PT LONG TERM GOAL #5   Title walk 600 feet with supervision and FWW   Status Partially Met               Plan - 12/08/16 1157    Clinical Impression Statement Patient has great difficulty getting weight onto the left leg and then being able to hold the mms in use due to the significant neglect of the left side, this is up and down at times and fatigue and distraction makes it worse.  She is starting to have some upper trap pain due to gaurding, she is very tense as she is trying to stabilize, again can do it with the leg but has to think about it.   PT Next Visit Plan She really needs to be more consistent with doing things at hoime and doing them correctly as well as pushing herself.   Consulted and Agree with Plan of Care Patient      Patient will benefit from skilled therapeutic intervention in order to improve the following deficits and impairments:  Abnormal gait, Cardiopulmonary status limiting activity, Decreased activity tolerance,  Decreased mobility, Decreased endurance, Decreased range of motion, Decreased strength, Difficulty walking, Impaired flexibility, Impaired sensation, Improper body mechanics, Pain, Impaired UE functional use  Visit Diagnosis: Muscle weakness (generalized)  Repeated falls  Difficulty in walking, not elsewhere classified  Other lack of coordination     Problem List Patient Active Problem List   Diagnosis Date Noted  . Metabolic syndrome 16/05/9603  . Encounter for preventive health examination 02/17/2016  . Morbid obesity ([place]) 06/17/2015  . Fall at home 12/01/2014  . Hypomagnesemia 04/24/2014  . Hemiparesis affecting left side as late effect of cerebrovascular accident ([place]) 04/24/2014  . Nontraumatic cerebral hemorrhage ([place]) 04/30/2012  . Diabetes type 2, controlled ([place]) 03/04/2010  . OSTEOPENIA 01/21/2009  . UNSPECIFIED VITAMIN D DEFICIENCY 11/19/2007  . ESSENTIAL HYPERTENSION, BENIGN 11/19/2007  . HYPERCHOLESTEROLEMIA 10/25/2006  . GASTROESOPHAGEAL REFLUX, NO ESOPHAGITIS 10/25/2006  . DIVERTICULOSIS OF COLON 10/25/2006  . Osteoarthritis 10/25/2006  . CERVICAL SPINE DISORDER, NOS 10/25/2006    Sumner Boast., PT 12/08/2016, 12:04 PM  [address] [place], [place], [address] Phone: [phone]   Fax:  [phone]  Name: Karen Jackson MRN: [id number] Date of Birth: Sep 17, 1940

## 2016-12-12 ENCOUNTER — Encounter: Payer: Self-pay | Admitting: Physical Therapy

## 2016-12-12 ENCOUNTER — Ambulatory Visit: Payer: Medicare Other | Admitting: Physical Therapy

## 2016-12-12 DIAGNOSIS — R296 Repeated falls: Secondary | ICD-10-CM

## 2016-12-12 DIAGNOSIS — R262 Difficulty in walking, not elsewhere classified: Secondary | ICD-10-CM

## 2016-12-12 DIAGNOSIS — R278 Other lack of coordination: Secondary | ICD-10-CM | POA: Diagnosis not present

## 2016-12-12 DIAGNOSIS — M6281 Muscle weakness (generalized): Secondary | ICD-10-CM | POA: Diagnosis not present

## 2016-12-12 DIAGNOSIS — R293 Abnormal posture: Secondary | ICD-10-CM | POA: Diagnosis not present

## 2016-12-12 NOTE — Therapy (Signed)
[place], [place], [address] Phone: [phone]   Fax:  [phone]  Physical Therapy Treatment  Patient Details  Name: Karen Jackson MRN: [id number] Date of Birth: 09-28-1940 Referring Provider: Andria Frames  Encounter Date: 12/12/2016      PT End of Session - 12/12/16 1324    Visit Number 26   Date for PT Re-Evaluation 01/02/17   PT Start Time 1011   PT Stop Time 1101   PT Time Calculation (min) 50 min   Activity Tolerance Patient tolerated treatment well   Behavior During Therapy [facility] for tasks assessed/performed      Past Medical History:  Diagnosis Date  . Arthritis   . Hyperlipidemia   . Hypertension   . Osteopenia     Past Surgical History:  Procedure Laterality Date  . ANKLE SURGERY    . APPENDECTOMY    . CHOLECYSTECTOMY    . HERNIA REPAIR     Esophagus  . JOINT REPLACEMENT     total- right partial- left  . MASTECTOMY PARTIAL / LUMPECTOMY  2012   left    There were no vitals filed for this visit.      Subjective Assessment - 12/12/16 1303    Subjective Patient reports that she had a good weekend, she reports that she has been having trouble with pulling her pants up, reports when she does that her ankle feels like it will roll.   Currently in Pain? No/denies                         [facility] Adult PT Treatment/Exercise - 12/12/16 0001      Transfers   Comments sit to stand with cues to shift weight onto the left leg, bring hip forward and stand up straight, 10 x     Ambulation/Gait   Gait Comments HHA 2x 180 feet, cues to pick up foot, have better posture and to relax her shoulders     High Level Balance   High Level Balance Comments Standing working on her simulating pulling her pants up, used yellow tband to simulate the waistband, she could not use the left arm to do this due to lack of feeling in the left hand.  cues needed to bear weight on the left,  stand up straight and have a little bend in the left knee as she tends to hyper extend the      Knee/Hip Exercises: Aerobic   Tread Mill 1.0 mph x 38mnutes, cues to decrease circumduction of the left LE     Knee/Hip Exercises: Standing   Other Standing Knee Exercises standing 6" toe clears holding a WBQC and a walker., then 4" step ups     Knee/Hip Exercises: Seated   Abd/Adduction Limitations standing green tband TKE without support to get her to use the left leg     Knee/Hip Exercises: Supine   Other Supine Knee/Hip Exercises red tband DF and eversion, 2# left hand punches and flexion, blue tband hip extension, 2# SAQ's and trunk rotations                  PT Short Term Goals - 09/19/16 1255      PT SHORT TERM GOAL #1   Title independent with initial HEP   Status Achieved           PT Long Term Goals - 12/05/16 1056  PT LONG TERM GOAL #1   Title decrease timed up and go test to 25 seconds   Status Partially Met     PT LONG TERM GOAL #2   Title increased right LE strength to 4-/5   Status Partially Met     PT LONG TERM GOAL #3   Title increased left LE strength to 4-/5 for the hip and knee   Status Partially Met     PT LONG TERM GOAL #4   Title no falls over a 6 week period   Status On-going     PT LONG TERM GOAL #5   Title walk 600 feet with supervision and FWW   Status Partially Met               Plan - 12/12/16 1325    Clinical Impression Statement Again working a lot on getting her to stand straight and bear weight on the left leg, she has highest risk for falls when she is dressing on her own,  We tried to simulate this today and she does have difficulty with extending the left knee, rolling the left ankle and leaning away from the left, all while struggling to pull up her pants.   PT Next Visit Plan Work more on bearing weight to see if increased approximation will help   Consulted and Agree with Plan of Care Patient      Patient  will benefit from skilled therapeutic intervention in order to improve the following deficits and impairments:  Abnormal gait, Cardiopulmonary status limiting activity, Decreased activity tolerance, Decreased mobility, Decreased endurance, Decreased range of motion, Decreased strength, Difficulty walking, Impaired flexibility, Impaired sensation, Improper body mechanics, Pain, Impaired UE functional use  Visit Diagnosis: Muscle weakness (generalized)  Repeated falls  Difficulty in walking, not elsewhere classified  Other lack of coordination     Problem List Patient Active Problem List   Diagnosis Date Noted  . Metabolic syndrome 23/36/1224  . Encounter for preventive health examination 02/17/2016  . Morbid obesity ([place]) 06/17/2015  . Fall at home 12/01/2014  . Hypomagnesemia 04/24/2014  . Hemiparesis affecting left side as late effect of cerebrovascular accident ([place]) 04/24/2014  . Nontraumatic cerebral hemorrhage ([place]) 04/30/2012  . Diabetes type 2, controlled ([place]) 03/04/2010  . OSTEOPENIA 01/21/2009  . UNSPECIFIED VITAMIN D DEFICIENCY 11/19/2007  . ESSENTIAL HYPERTENSION, BENIGN 11/19/2007  . HYPERCHOLESTEROLEMIA 10/25/2006  . GASTROESOPHAGEAL REFLUX, NO ESOPHAGITIS 10/25/2006  . DIVERTICULOSIS OF COLON 10/25/2006  . Osteoarthritis 10/25/2006  . CERVICAL SPINE DISORDER, NOS 10/25/2006    Sumner Boast., PT 12/12/2016, 1:28 PM  [address] [place], [place], [address] Phone: [phone]   Fax:  [phone]  Name: AIMY SWEETING MRN: [id number] Date of Birth: 1941/02/09

## 2016-12-14 ENCOUNTER — Other Ambulatory Visit: Payer: Self-pay | Admitting: Family Medicine

## 2016-12-15 ENCOUNTER — Encounter: Payer: Self-pay | Admitting: Physical Therapy

## 2016-12-15 ENCOUNTER — Ambulatory Visit: Payer: Medicare Other | Admitting: Physical Therapy

## 2016-12-15 DIAGNOSIS — R296 Repeated falls: Secondary | ICD-10-CM

## 2016-12-15 DIAGNOSIS — M6281 Muscle weakness (generalized): Secondary | ICD-10-CM

## 2016-12-15 DIAGNOSIS — R278 Other lack of coordination: Secondary | ICD-10-CM

## 2016-12-15 DIAGNOSIS — R262 Difficulty in walking, not elsewhere classified: Secondary | ICD-10-CM

## 2016-12-15 DIAGNOSIS — R293 Abnormal posture: Secondary | ICD-10-CM | POA: Diagnosis not present

## 2016-12-15 NOTE — Therapy (Signed)
[place], [place], [address] Phone: [phone]   Fax:  [phone]  Physical Therapy Treatment  Patient Details  Name: Karen Jackson MRN: [id number] Date of Birth: May 22, 1941 Referring Provider: Andria Frames  Encounter Date: 12/15/2016      PT End of Session - 12/15/16 1055    Visit Number 27   Date for PT Re-Evaluation 01/02/17   PT Start Time 1007   PT Stop Time 1055   PT Time Calculation (min) 48 min   Activity Tolerance Patient tolerated treatment well   Behavior During Therapy [facility] for tasks assessed/performed      Past Medical History:  Diagnosis Date  . Arthritis   . Hyperlipidemia   . Hypertension   . Osteopenia     Past Surgical History:  Procedure Laterality Date  . ANKLE SURGERY    . APPENDECTOMY    . CHOLECYSTECTOMY    . HERNIA REPAIR     Esophagus  . JOINT REPLACEMENT     total- right partial- left  . MASTECTOMY PARTIAL / LUMPECTOMY  2012   left    There were no vitals filed for this visit.      Subjective Assessment - 12/15/16 1053    Subjective She reports that she was able to put her pants up better after the last visit, no episodes of the ankle rolling this week   Currently in Pain? No/denies                         [facility] Adult PT Treatment/Exercise - 12/15/16 0001      Ambulation/Gait   Gait Comments gait 180' x 2, and 120' x 2 working on step length, if she is distracted she stops using the left leg and stumbles     High Level Balance   High Level Balance Comments Standing working on her simulating pulling her pants up, used yellow tband to simulate the waistband, she could not use the left arm to do this due to lack of feeling in the left hand.  cues needed to bear weight on the left, stand up straight and have a little bend in the left knee as she tends to hyper extend the      Knee/Hip Exercises: Aerobic   Tread Mill 1.0 mph x 34mnutes,  cues to decrease circumduction of the left LE     Knee/Hip Exercises: Standing   Other Standing Knee Exercises standing green tand TKE, cues to weight shift, stand up straight, standing  tbans scapoular exercises     Knee/Hip Exercises: Supine   Other Supine Knee/Hip Exercises red tband DF and eversion, 2# left hand punches and flexion, blue tband hip extension, 2# SAQ's and trunk rotations                  PT Short Term Goals - 09/19/16 1255      PT SHORT TERM GOAL #1   Title independent with initial HEP   Status Achieved           PT Long Term Goals - 12/05/16 1056      PT LONG TERM GOAL #1   Title decrease timed up and go test to 25 seconds   Status Partially Met     PT LONG TERM GOAL #2   Title increased right LE strength to 4-/5   Status Partially Met     PT LONG TERM GOAL #3  Title increased left LE strength to 4-/5 for the hip and knee   Status Partially Met     PT LONG TERM GOAL #4   Title no falls over a 6 week period   Status On-going     PT LONG TERM GOAL #5   Title walk 600 feet with supervision and FWW   Status Partially Met               Plan - 12/15/16 1055    Clinical Impression Statement Patient really has difficulty with any use of the left extremities especially with distraction, i.e. people talking, people moving in the background, this causes her to stumble.  She did better with reaching and weight shift today, still fearful of leaning and reaching to the left   PT Next Visit Plan Work more on bearing weight to see if increased approximation will help   Consulted and Agree with Plan of Care Patient      Patient will benefit from skilled therapeutic intervention in order to improve the following deficits and impairments:  Abnormal gait, Cardiopulmonary status limiting activity, Decreased activity tolerance, Decreased mobility, Decreased endurance, Decreased range of motion, Decreased strength, Difficulty walking, Impaired  flexibility, Impaired sensation, Improper body mechanics, Pain, Impaired UE functional use  Visit Diagnosis: Muscle weakness (generalized)  Difficulty in walking, not elsewhere classified  Repeated falls  Other lack of coordination     Problem List Patient Active Problem List   Diagnosis Date Noted  . Metabolic syndrome 71/25/2479  . Encounter for preventive health examination 02/17/2016  . Morbid obesity ([place]) 06/17/2015  . Fall at home 12/01/2014  . Hypomagnesemia 04/24/2014  . Hemiparesis affecting left side as late effect of cerebrovascular accident ([place]) 04/24/2014  . Nontraumatic cerebral hemorrhage ([place]) 04/30/2012  . Diabetes type 2, controlled ([place]) 03/04/2010  . OSTEOPENIA 01/21/2009  . UNSPECIFIED VITAMIN D DEFICIENCY 11/19/2007  . ESSENTIAL HYPERTENSION, BENIGN 11/19/2007  . HYPERCHOLESTEROLEMIA 10/25/2006  . GASTROESOPHAGEAL REFLUX, NO ESOPHAGITIS 10/25/2006  . DIVERTICULOSIS OF COLON 10/25/2006  . Osteoarthritis 10/25/2006  . CERVICAL SPINE DISORDER, NOS 10/25/2006    Sumner Boast., PT 12/15/2016, 10:58 AM  [address] [place], [place], [address] Phone: [phone]   Fax:  [phone]  Name: DORRI OZTURK MRN: [id number] Date of Birth: Jan 10, 1941

## 2016-12-19 ENCOUNTER — Ambulatory Visit: Payer: Medicare Other | Admitting: Physical Therapy

## 2016-12-19 ENCOUNTER — Encounter: Payer: Self-pay | Admitting: Physical Therapy

## 2016-12-19 DIAGNOSIS — R296 Repeated falls: Secondary | ICD-10-CM

## 2016-12-19 DIAGNOSIS — R262 Difficulty in walking, not elsewhere classified: Secondary | ICD-10-CM

## 2016-12-19 DIAGNOSIS — R293 Abnormal posture: Secondary | ICD-10-CM | POA: Diagnosis not present

## 2016-12-19 DIAGNOSIS — M6281 Muscle weakness (generalized): Secondary | ICD-10-CM | POA: Diagnosis not present

## 2016-12-19 DIAGNOSIS — R278 Other lack of coordination: Secondary | ICD-10-CM

## 2016-12-19 NOTE — Therapy (Signed)
[address] [place], [place], [address] Phone: [phone]   Fax:  [phone]  Physical Therapy Treatment  Patient Details  Name: Karen Jackson MRN: [id number] Date of Birth: 01-13-1941 Referring Provider: Andria Frames  Encounter Date: 12/19/2016      PT End of Session - 12/19/16 1045    Visit Number 28   Date for PT Re-Evaluation 01/02/17   PT Start Time 1010  patient unable to tolerate activity today due to joint pain   PT Stop Time 1100   PT Time Calculation (min) 50 min   Activity Tolerance Patient limited by pain   Behavior During Therapy [facility] for tasks assessed/performed      Past Medical History:  Diagnosis Date  . Arthritis   . Hyperlipidemia   . Hypertension   . Osteopenia     Past Surgical History:  Procedure Laterality Date  . ANKLE SURGERY    . APPENDECTOMY    . CHOLECYSTECTOMY    . HERNIA REPAIR     Esophagus  . JOINT REPLACEMENT     total- right partial- left  . MASTECTOMY PARTIAL / LUMPECTOMY  2012   left    There were no vitals filed for this visit.      Subjective Assessment - 12/19/16 1033    Subjective Reports that with the rain she is stiff and in a lot more pain in the shoulders, knees and ankle.     Currently in Pain? Yes   Pain Score 6    Pain Location Shoulder  both shoulders, knees and left ankle   Pain Orientation Right;Left   Pain Descriptors / Indicators Aching;Sore                         [facility] Adult PT Treatment/Exercise - 12/19/16 0001      Transfers   Comments sit to stand with cues to shift weight onto the left leg, bring hip forward and stand up straight, 10 x     Ambulation/Gait   Gait Comments 180' with HHA, then with SBQC x 30 feet     High Level Balance   High Level Balance Comments Standing working on her simulating pulling her pants up, used yellow tband to simulate the waistband, she could not use the left arm to do this  due to lack of feeling in the left hand.  cues needed to bear weight on the left, stand up straight and have a little bend in the left knee as she tends to hyper extend the      Knee/Hip Exercises: Aerobic   Tread Mill 1.0 mph x 4 minutes, cues to decrease circumduction of the left LE, had a lot of c/o shoulder pain with audible popping, this also caused her to catch her left toe multiple times     Knee/Hip Exercises: Standing   Other Standing Knee Exercises standing green tand TKE, cues to weight shift, stand up straight, standing  tbans scapoular exercises     Knee/Hip Exercises: Seated   Other Seated Knee/Hip Exercises LAQ an SAQ's 3# 3 sets 10 focus on TKE hold 3 sec, SLR's 2# left only   Sit to Sand 5 reps;2 sets;without UE support     Knee/Hip Exercises: Supine   Bridges with Diona Foley Squeeze 2 sets;15 reps   Straight Leg Raises 2 sets;10 reps   Other Supine Knee/Hip Exercises red tband DF and eversion, 2# left hand punches  and flexion, blue tband hip extension, 2# SAQ's and trunk rotations   Other Supine Knee/Hip Exercises partial sit ups with her reaching with the left arm,  tband leg pull downs, ball reaching in supine over head with both hands, bridges, supine with her holding tband and doing trunk rotations, 1, 2 and 3# left arm presses needing cues to stabilize and control the motions, supine LE trunk rotation, power moves in sitting                  PT Short Term Goals - 09/19/16 1255      PT SHORT TERM GOAL #1   Title independent with initial HEP   Status Achieved           PT Long Term Goals - 12/05/16 1056      PT LONG TERM GOAL #1   Title decrease timed up and go test to 25 seconds   Status Partially Met     PT LONG TERM GOAL #2   Title increased right LE strength to 4-/5   Status Partially Met     PT LONG TERM GOAL #3   Title increased left LE strength to 4-/5 for the hip and knee   Status Partially Met     PT LONG TERM GOAL #4   Title no falls  over a 6 week period   Status On-going     PT LONG TERM GOAL #5   Title walk 600 feet with supervision and FWW   Status Partially Met               Plan - 12/19/16 1045    Clinical Impression Statement Patient with a lot more c/o joint pain shoulders, knees and ankles, there was audible and palapable crepitus throughout her body with walking and exercises, we had to decrease the exercises today due to this, the weather is very damp which could be a contributing factor   PT Next Visit Plan Work more on bearing weight to see if increased approximation will help   Consulted and Agree with Plan of Care Patient      Patient will benefit from skilled therapeutic intervention in order to improve the following deficits and impairments:  Abnormal gait, Cardiopulmonary status limiting activity, Decreased activity tolerance, Decreased mobility, Decreased endurance, Decreased range of motion, Decreased strength, Difficulty walking, Impaired flexibility, Impaired sensation, Improper body mechanics, Pain, Impaired UE functional use  Visit Diagnosis: Muscle weakness (generalized)  Difficulty in walking, not elsewhere classified  Repeated falls  Other lack of coordination     Problem List Patient Active Problem List   Diagnosis Date Noted  . Metabolic syndrome 20/94/7096  . Encounter for preventive health examination 02/17/2016  . Morbid obesity ([place]) 06/17/2015  . Fall at home 12/01/2014  . Hypomagnesemia 04/24/2014  . Hemiparesis affecting left side as late effect of cerebrovascular accident ([place]) 04/24/2014  . Nontraumatic cerebral hemorrhage ([place]) 04/30/2012  . Diabetes type 2, controlled ([place]) 03/04/2010  . OSTEOPENIA 01/21/2009  . UNSPECIFIED VITAMIN D DEFICIENCY 11/19/2007  . ESSENTIAL HYPERTENSION, BENIGN 11/19/2007  . HYPERCHOLESTEROLEMIA 10/25/2006  . GASTROESOPHAGEAL REFLUX, NO ESOPHAGITIS 10/25/2006  . DIVERTICULOSIS OF COLON 10/25/2006  . Osteoarthritis 10/25/2006   . CERVICAL SPINE DISORDER, NOS 10/25/2006    Sumner Boast., PT 12/19/2016, 10:47 AM  [address] [place], [place], [address] Phone: [phone]   Fax:  [phone]  Name: MALAYZIA LAFORTE MRN: [id number] Date of Birth: 1941/08/18

## 2016-12-21 ENCOUNTER — Encounter: Payer: Self-pay | Admitting: Physical Therapy

## 2016-12-21 ENCOUNTER — Ambulatory Visit: Payer: Medicare Other | Admitting: Physical Therapy

## 2016-12-21 ENCOUNTER — Other Ambulatory Visit: Payer: Self-pay | Admitting: Family Medicine

## 2016-12-21 DIAGNOSIS — R262 Difficulty in walking, not elsewhere classified: Secondary | ICD-10-CM | POA: Diagnosis not present

## 2016-12-21 DIAGNOSIS — M6281 Muscle weakness (generalized): Secondary | ICD-10-CM

## 2016-12-21 DIAGNOSIS — R293 Abnormal posture: Secondary | ICD-10-CM | POA: Diagnosis not present

## 2016-12-21 DIAGNOSIS — R296 Repeated falls: Secondary | ICD-10-CM

## 2016-12-21 DIAGNOSIS — R278 Other lack of coordination: Secondary | ICD-10-CM | POA: Diagnosis not present

## 2016-12-21 NOTE — Therapy (Signed)
[place] [address] [place], [place], [address] Phone: [phone]   Fax:  [phone]  Physical Therapy Treatment  Patient Details  Name: Karen Jackson MRN: [id number] Date of Birth: 1940/09/04 Referring Provider: Andria Frames  Encounter Date: 12/21/2016      PT End of Session - 12/21/16 1025    Visit Number 29   Date for PT Re-Evaluation 01/02/17   PT Start Time 0932   PT Stop Time 1018   PT Time Calculation (min) 46 min   Activity Tolerance Patient limited by pain   Behavior During Therapy [facility] for tasks assessed/performed      Past Medical History:  Diagnosis Date  . Arthritis   . Hyperlipidemia   . Hypertension   . Osteopenia     Past Surgical History:  Procedure Laterality Date  . ANKLE SURGERY    . APPENDECTOMY    . CHOLECYSTECTOMY    . HERNIA REPAIR     Esophagus  . JOINT REPLACEMENT     total- right partial- left  . MASTECTOMY PARTIAL / LUMPECTOMY  2012   left    There were no vitals filed for this visit.      Subjective Assessment - 12/21/16 0936    Subjective Patient reports that she has continued to have increased pain and stiffness with right shoulder since the heavy rain earlier in the week.  Reports that she has been having difficulty getting out of the chair, needing help at times the past few days   Currently in Pain? Yes   Pain Score 6    Pain Location Shoulder   Pain Orientation Right;Left   Pain Descriptors / Indicators Aching;Sore   Aggravating Factors  weather                         [facility] Adult PT Treatment/Exercise - 12/21/16 0001      Ambulation/Gait   Gait Comments 180' with HHA, then with SBQC x 30 feet, worked with cues for her to relax the shoulders and UE's as when she grips tight or gaurds there is a lot more crepitus and pain in the shoulders     Knee/Hip Exercises: Aerobic   Nustep Level 5 x 6 minutes     Knee/Hip Exercises: Standing   Other  Standing Knee Exercises red tband HS curls, ankle DF and eversion all 3x10      Knee/Hip Exercises: Seated   Other Seated Knee/Hip Exercises LAQ an SAQ's 3# 3 sets 10 focus on TKE hold 3 sec, SLR's 2# left only   Sit to Sand 5 reps;2 sets;without UE support  cues for left LE, still a lot of neglect     Knee/Hip Exercises: Sidelying   Other Sidelying Knee/Hip Exercises blue tband hip extension, sliding board hip abduction     Modalities   Modalities Electrical Stimulation;Moist Heat     Moist Heat Therapy   Number Minutes Moist Heat 15 Minutes   Moist Heat Location Shoulder     Electrical Stimulation   Electrical Stimulation Location right shoulder   Electrical Stimulation Action IFC   Electrical Stimulation Parameters sitting   Electrical Stimulation Goals Pain                  PT Short Term Goals - 09/19/16 1255      PT SHORT TERM GOAL #1   Title independent with initial HEP   Status Achieved  PT Long Term Goals - 12/21/16 1028      PT LONG TERM GOAL #1   Title decrease timed up and go test to 25 seconds   Status On-going               Plan - 12/21/16 1025    Clinical Impression Statement Patient with continued c/o joint pain especially her right shoulder.  With ambulation you can hear and feel the crepitus, with cues to relax the shoulders and arms she can decrease this, but her tendency is to really gaurd and splint.   PT Next Visit Plan will see if the modalities helped her pain   Consulted and Agree with Plan of Care Patient      Patient will benefit from skilled therapeutic intervention in order to improve the following deficits and impairments:  Abnormal gait, Cardiopulmonary status limiting activity, Decreased activity tolerance, Decreased mobility, Decreased endurance, Decreased range of motion, Decreased strength, Difficulty walking, Impaired flexibility, Impaired sensation, Improper body mechanics, Pain, Impaired UE functional  use  Visit Diagnosis: Muscle weakness (generalized)  Difficulty in walking, not elsewhere classified  Repeated falls     Problem List Patient Active Problem List   Diagnosis Date Noted  . Metabolic syndrome 92/08/69  . Encounter for preventive health examination 02/17/2016  . Morbid obesity ([place]) 06/17/2015  . Fall at home 12/01/2014  . Hypomagnesemia 04/24/2014  . Hemiparesis affecting left side as late effect of cerebrovascular accident ([place]) 04/24/2014  . Nontraumatic cerebral hemorrhage ([place]) 04/30/2012  . Diabetes type 2, controlled ([place]) 03/04/2010  . OSTEOPENIA 01/21/2009  . UNSPECIFIED VITAMIN D DEFICIENCY 11/19/2007  . ESSENTIAL HYPERTENSION, BENIGN 11/19/2007  . HYPERCHOLESTEROLEMIA 10/25/2006  . GASTROESOPHAGEAL REFLUX, NO ESOPHAGITIS 10/25/2006  . DIVERTICULOSIS OF COLON 10/25/2006  . Osteoarthritis 10/25/2006  . CERVICAL SPINE DISORDER, NOS 10/25/2006    Sumner Boast., PT 12/21/2016, 10:29 AM  [place] [address] [facility] [place], [place], [address] Phone: [phone]   Fax:  [phone]  Name: Karen Jackson MRN: [id number] Date of Birth: Apr 19, 1941

## 2016-12-25 ENCOUNTER — Encounter: Payer: Self-pay | Admitting: Physical Therapy

## 2016-12-25 ENCOUNTER — Ambulatory Visit: Payer: Medicare Other | Admitting: Physical Therapy

## 2016-12-25 DIAGNOSIS — R296 Repeated falls: Secondary | ICD-10-CM

## 2016-12-25 DIAGNOSIS — R278 Other lack of coordination: Secondary | ICD-10-CM

## 2016-12-25 DIAGNOSIS — M6281 Muscle weakness (generalized): Secondary | ICD-10-CM

## 2016-12-25 DIAGNOSIS — R262 Difficulty in walking, not elsewhere classified: Secondary | ICD-10-CM | POA: Diagnosis not present

## 2016-12-25 DIAGNOSIS — R293 Abnormal posture: Secondary | ICD-10-CM | POA: Diagnosis not present

## 2016-12-25 NOTE — Therapy (Signed)
[address] [place], [place], [address] Phone: [phone]   Fax:  [phone]  Physical Therapy Treatment  Patient Details  Name: Karen Jackson MRN: [id number] Date of Birth: [birth year] Referring Provider: Andria Frames  Encounter Date: 12/25/2016      PT End of Session - 12/25/16 1113    Visit Number 30   Date for PT Re-Evaluation 01/02/17   PT Start Time 1005   PT Stop Time 1110   PT Time Calculation (min) 65 min   Activity Tolerance Patient limited by pain;Patient limited by fatigue   Behavior During Therapy [facility] for tasks assessed/performed      Past Medical History:  Diagnosis Date  . Arthritis   . Hyperlipidemia   . Hypertension   . Osteopenia     Past Surgical History:  Procedure Laterality Date  . ANKLE SURGERY    . APPENDECTOMY    . CHOLECYSTECTOMY    . HERNIA REPAIR     Esophagus  . JOINT REPLACEMENT     total- right partial- left  . MASTECTOMY PARTIAL / LUMPECTOMY  2012   left    There were no vitals filed for this visit.      Subjective Assessment - 12/25/16 1009    Subjective Patient reports a fall out of bed on Saturday AM.  She reports that she was getting out of bed and the w/c was not locked and it rolled away.  She reports falling and having the left knee fold up under her, she reports that she was in that position for 15 minutes, reports that the left knee is really sore and tired today, giving way with walking.   Currently in Pain? Yes   Pain Score 7    Pain Location Knee   Pain Orientation Left   Pain Descriptors / Indicators Sore   Aggravating Factors  falling and having it folded up under her   Pain Relieving Factors resti ice and heat            [facility] PT Assessment - 12/25/16 0001      AROM   Left Knee Extension 0   Left Knee Flexion 97  with pain     Palpation   Palpation comment tender medial and anterior knee     Ambulation/Gait   Gait Comments 180'  x2, with HHA, very slow today, needing multiple rests due to the left knee pain and weakness, she needed to stop and rest on the way out due to fatigue and the left knee giving way                     [facility] Adult PT Treatment/Exercise - 12/25/16 0001      High Level Balance   High Level Balance Comments Standing weight shifts and then also finding correct posture and bearing weight equally through the extremities, having her bring the left hip forward and using good posture     Knee/Hip Exercises: Aerobic   Nustep Level 4 x 6 minutes     Knee/Hip Exercises: Standing   Other Standing Knee Exercises red tband HS curls, ankle DF and eversion all 3x10      Knee/Hip Exercises: Seated   Other Seated Knee/Hip Exercises LAQ an SAQ's 2# 3 sets 10 focus on TKE hold 3 sec, SLR's 2# left only   Abd/Adduction Limitations standing green tband TKE without support to get her to use the left leg  Knee/Hip Exercises: Supine   Other Supine Knee/Hip Exercises red tband DF and eversion, 2# left hand punches and flexion, blue tband hip extension, 2# SAQ's and trunk rotations     Modalities   Modalities Electrical Stimulation;Moist Heat     Electrical Stimulation   Electrical Stimulation Location left knee   Electrical Stimulation Action IFC   Electrical Stimulation Parameters sitting with ice   Electrical Stimulation Goals Pain                  PT Short Term Goals - 09/19/16 1255      PT SHORT TERM GOAL #1   Title independent with initial HEP   Status Achieved           PT Long Term Goals - 12/21/16 1028      PT LONG TERM GOAL #1   Title decrease timed up and go test to 25 seconds   Status On-going               Plan - 20-Jan-2017 1113    Clinical Impression Statement Patient with a fall over the weekend and reporting she twisted her left ankle and knee, the side with the hemiplegia, she reports increased pain with walking and the knee "giving way".  She  had more difficulty walking out with the left knee giving and her shuffling the left foot with swing through, needing increased cues to help with this, on the way out she had to rest once due to fatigue, she did report less pain with walking on the way out.  The fall was due to her not locking the wheel chair fully and trying to transfer to go to the bathroom.  Due to the hemiplegia, when things like this occur she really does regress as she does not have the awareness to pay attention and correct poor habits.  She needs increased cues and awareness to perform and correct.   PT Next Visit Plan continue to assess the knee and her neglect of the left side   Consulted and Agree with Plan of Care Patient      Patient will benefit from skilled therapeutic intervention in order to improve the following deficits and impairments:  Abnormal gait, Cardiopulmonary status limiting activity, Decreased activity tolerance, Decreased mobility, Decreased endurance, Decreased range of motion, Decreased strength, Difficulty walking, Impaired flexibility, Impaired sensation, Improper body mechanics, Pain, Impaired UE functional use  Visit Diagnosis: Muscle weakness (generalized)  Difficulty in walking, not elsewhere classified  Repeated falls  Other lack of coordination       G-Codes - 01/20/17 1123    Functional Assessment Tool Used (Outpatient Only) foto 54% limited   Functional Limitation Mobility: Walking and moving around   Mobility: Walking and Moving Around Current Status [phone]) At least 40 percent but less than 60 percent impaired, limited or restricted   Mobility: Walking and Moving Around Goal Status [phone]) At least 40 percent but less than 60 percent impaired, limited or restricted      Problem List Patient Active Problem List   Diagnosis Date Noted  . Metabolic syndrome 82/95/6213  . Encounter for preventive health examination 02/17/2016  . Morbid obesity ([place]) 06/17/2015  . Fall at home  12/01/2014  . Hypomagnesemia 04/24/2014  . Hemiparesis affecting left side as late effect of cerebrovascular accident ([place]) 04/24/2014  . Nontraumatic cerebral hemorrhage ([place]) 04/30/2012  . Diabetes type 2, controlled ([place]) 03/04/2010  . OSTEOPENIA 01/21/2009  . UNSPECIFIED VITAMIN D DEFICIENCY 11/19/2007  . ESSENTIAL  HYPERTENSION, BENIGN 11/19/2007  . HYPERCHOLESTEROLEMIA 10/25/2006  . GASTROESOPHAGEAL REFLUX, NO ESOPHAGITIS 10/25/2006  . DIVERTICULOSIS OF COLON 10/25/2006  . Osteoarthritis 10/25/2006  . CERVICAL SPINE DISORDER, NOS 10/25/2006    Sumner Boast., PT 12/25/2016, 11:24 AM  [address] [place], [place], [address] Phone: [phone]   Fax:  [phone]  Name: ADDALINE PEPLINSKI MRN: [id number] Date of Birth: 07-08-1941

## 2016-12-29 ENCOUNTER — Encounter: Payer: Self-pay | Admitting: Physical Therapy

## 2016-12-29 ENCOUNTER — Ambulatory Visit: Payer: Medicare Other | Attending: Family Medicine | Admitting: Physical Therapy

## 2016-12-29 DIAGNOSIS — R262 Difficulty in walking, not elsewhere classified: Secondary | ICD-10-CM | POA: Diagnosis not present

## 2016-12-29 DIAGNOSIS — R296 Repeated falls: Secondary | ICD-10-CM | POA: Diagnosis not present

## 2016-12-29 DIAGNOSIS — R2242 Localized swelling, mass and lump, left lower limb: Secondary | ICD-10-CM | POA: Diagnosis not present

## 2016-12-29 DIAGNOSIS — M6281 Muscle weakness (generalized): Secondary | ICD-10-CM | POA: Insufficient documentation

## 2016-12-29 DIAGNOSIS — R278 Other lack of coordination: Secondary | ICD-10-CM | POA: Diagnosis not present

## 2016-12-29 NOTE — Therapy (Signed)
[address] [place], [place], [address] Phone: [phone]   Fax:  [phone]  Physical Therapy Treatment  Patient Details  Name: Karen Jackson MRN: [id number] Date of Birth: [birth year] Referring Provider: Andria Frames  Encounter Date: 12/29/2016      PT End of Session - 12/29/16 0956    Visit Number 31   Date for PT Re-Evaluation 01/02/17   PT Start Time 0929   PT Stop Time 1010   PT Time Calculation (min) 41 min   Activity Tolerance Patient limited by pain;Patient limited by fatigue   Behavior During Therapy [facility] for tasks assessed/performed      Past Medical History:  Diagnosis Date  . Arthritis   . Hyperlipidemia   . Hypertension   . Osteopenia     Past Surgical History:  Procedure Laterality Date  . ANKLE SURGERY    . APPENDECTOMY    . CHOLECYSTECTOMY    . HERNIA REPAIR     Esophagus  . JOINT REPLACEMENT     total- right partial- left  . MASTECTOMY PARTIAL / LUMPECTOMY  2012   left    There were no vitals filed for this visit.      Subjective Assessment - 12/29/16 0936    Subjective Patient has continued to have issues since she fell, she reports that she has stopped all walking at home except to the bathroom.  Reports that she is feeling better, still a lot of fear.     Currently in Pain? Yes   Pain Score 3    Pain Location Knee   Pain Orientation Left                         [facility] Adult PT Treatment/Exercise - 12/29/16 0001      Ambulation/Gait   Gait Comments 180' x2, with HHA, very slow today, needing multiple rests due to the left knee pain and weakness, she needed to stop and rest on the way out due to fatigue and the left knee giving way, a little better than earlier this week but still hesitant and some unsteadiness in the knee and th eankle     High Level Balance   High Level Balance Activities Side stepping;Backward walking   High Level Balance Comments  Standing weight shifts and then also finding correct posture and bearing weight equally through the extremities, having her bring the left hip forward and using good posture, worked on some SLS on the left     Knee/Hip Exercises: Aerobic   Nustep Level 4 x 6 minutes     Knee/Hip Exercises: Standing   Other Standing Knee Exercises red tband HS curls, ankle DF and eversion all 3x10      Knee/Hip Exercises: Seated   Other Seated Knee/Hip Exercises LAQ an SAQ's 2# 3 sets 10 focus on TKE hold 3 sec, SLR's 2# left only     Knee/Hip Exercises: Sidelying   Other Sidelying Knee/Hip Exercises blue tband hip extension, sliding board hip abduction                  PT Short Term Goals - 09/19/16 1255      PT SHORT TERM GOAL #1   Title independent with initial HEP   Status Achieved           PT Long Term Goals - 12/21/16 1028      PT LONG TERM GOAL #1  Title decrease timed up and go test to 25 seconds   Status On-going               Plan - 12/29/16 0956    Clinical Impression Statement Patient a little better today but still very fearful and does not trust the left leg, needs manual assist to get weight on the left and assist for stability of the ankle and the knee   PT Next Visit Plan continue to assess the knee and her neglect of the left side   Consulted and Agree with Plan of Care Patient      Patient will benefit from skilled therapeutic intervention in order to improve the following deficits and impairments:  Abnormal gait, Cardiopulmonary status limiting activity, Decreased activity tolerance, Decreased mobility, Decreased endurance, Decreased range of motion, Decreased strength, Difficulty walking, Impaired flexibility, Impaired sensation, Improper body mechanics, Pain, Impaired UE functional use  Visit Diagnosis: Muscle weakness (generalized)  Difficulty in walking, not elsewhere classified  Repeated falls  Other lack of coordination     Problem  List Patient Active Problem List   Diagnosis Date Noted  . Metabolic syndrome 11/91/4782  . Encounter for preventive health examination 02/17/2016  . Morbid obesity ([place]) 06/17/2015  . Fall at home 12/01/2014  . Hypomagnesemia 04/24/2014  . Hemiparesis affecting left side as late effect of cerebrovascular accident ([place]) 04/24/2014  . Nontraumatic cerebral hemorrhage ([place]) 04/30/2012  . Diabetes type 2, controlled ([place]) 03/04/2010  . OSTEOPENIA 01/21/2009  . UNSPECIFIED VITAMIN D DEFICIENCY 11/19/2007  . ESSENTIAL HYPERTENSION, BENIGN 11/19/2007  . HYPERCHOLESTEROLEMIA 10/25/2006  . GASTROESOPHAGEAL REFLUX, NO ESOPHAGITIS 10/25/2006  . DIVERTICULOSIS OF COLON 10/25/2006  . Osteoarthritis 10/25/2006  . CERVICAL SPINE DISORDER, NOS 10/25/2006    Sumner Boast., PT 12/29/2016, 9:58 AM  [place] [address] [place], [place], [address] Phone: [phone]   Fax:  [phone]  Name: Karen Jackson MRN: [id number] Date of Birth: 11/22/1940

## 2017-01-02 ENCOUNTER — Ambulatory Visit: Payer: Medicare Other | Admitting: Physical Therapy

## 2017-01-02 ENCOUNTER — Encounter: Payer: Self-pay | Admitting: Physical Therapy

## 2017-01-02 DIAGNOSIS — R262 Difficulty in walking, not elsewhere classified: Secondary | ICD-10-CM | POA: Diagnosis not present

## 2017-01-02 DIAGNOSIS — R2242 Localized swelling, mass and lump, left lower limb: Secondary | ICD-10-CM | POA: Diagnosis not present

## 2017-01-02 DIAGNOSIS — M6281 Muscle weakness (generalized): Secondary | ICD-10-CM | POA: Diagnosis not present

## 2017-01-02 DIAGNOSIS — R278 Other lack of coordination: Secondary | ICD-10-CM | POA: Diagnosis not present

## 2017-01-02 DIAGNOSIS — R296 Repeated falls: Secondary | ICD-10-CM

## 2017-01-02 NOTE — Therapy (Signed)
[address] [place], [place], [address] Phone: [phone]   Fax:  [phone]  Physical Therapy Treatment  Patient Details  Name: Karen Jackson MRN: [id number] Date of Birth: Mar 07, 1941 Referring Provider: Andria Frames  Encounter Date: 01/02/2017      PT End of Session - 01/02/17 1224    Visit Number 32   Date for PT Re-Evaluation 02/02/17   PT Start Time 1005   PT Stop Time 1054   PT Time Calculation (min) 49 min   Activity Tolerance Patient limited by pain;Patient limited by fatigue   Behavior During Therapy [facility] for tasks assessed/performed      Past Medical History:  Diagnosis Date  . Arthritis   . Hyperlipidemia   . Hypertension   . Osteopenia     Past Surgical History:  Procedure Laterality Date  . ANKLE SURGERY    . APPENDECTOMY    . CHOLECYSTECTOMY    . HERNIA REPAIR     Esophagus  . JOINT REPLACEMENT     total- right partial- left  . MASTECTOMY PARTIAL / LUMPECTOMY  2012   left    There were no vitals filed for this visit.      Subjective Assessment - 01/02/17 1221    Subjective Patient reports that she did okay this weekend, used the w/c more.  Reports that today she is stiff and sore int he left ankle and knee.   Currently in Pain? Yes   Pain Score 3    Pain Location Knee   Pain Orientation Left                         [facility] Adult PT Treatment/Exercise - 01/02/17 0001      Ambulation/Gait   Gait Comments 180' x 2 with HHA, much worse pattern with the left leg being very stiff and out, she had twice that she needed mod/max A to keep from falling forward as she caught her toe on the ground     High Level Balance   High Level Balance Comments Standing weight shifts and then also finding correct posture and bearing weight equally through the extremities, having her bring the left hip forward and using good posture, worked on some SLS on the left     Knee/Hip  Exercises: Aerobic   Nustep Level 4 x 6 minutes     Knee/Hip Exercises: Standing   Other Standing Knee Exercises red tband HS curls, ankle DF and eversion all 3x10      Knee/Hip Exercises: Supine   Other Supine Knee/Hip Exercises partial sit ups with her reaching with the left arm,  tband leg pull downs, ball reaching in supine over head with both hands, bridges, supine with her holding tband and doing trunk rotations, 1, 2 and 3# left arm presses needing cues to stabilize and control the motions, supine LE trunk rotation, power moves in sitting                  PT Short Term Goals - 09/19/16 1255      PT SHORT TERM GOAL #1   Title independent with initial HEP   Status Achieved           PT Long Term Goals - 01/02/17 1229      PT LONG TERM GOAL #1   Title decrease timed up and go test to 25 seconds   Status On-going  PT LONG TERM GOAL #2   Title increased right LE strength to 4-/5   Status Partially Met     PT LONG TERM GOAL #4   Title no falls over a 6 week period   Status On-going     PT LONG TERM GOAL #5   Title walk 600 feet with supervision and FWW   Status On-going               Plan - 01/02/17 1225    Clinical Impression Statement Patient has been much worse since her fall about 10 days ago, she has increased left knee pain, she has increased neglect of the left LE, she was unable to bear mcuh weight on the left leg today due to c/o knee pain, she reports that she was not this sore yesterday.  She had been progressing well until she had the fall.  She has regressed dramatically and I asked her if she felt like she needed to see her MD, she reports that she feels like it is a sprain, I feel that due to the neurologic issues from the stroke that when things like this happen she regresses greatly due to her inability to focus and contri=ol the left side as she resorts to sompensation much more   PT Frequency 2x / week   PT Duration 4 weeks   PT  Next Visit Plan sent in renewal, there has been a regression and would like to continue over the next 4 weeks as she had made good progress   Consulted and Agree with Plan of Care Patient      Patient will benefit from skilled therapeutic intervention in order to improve the following deficits and impairments:  Abnormal gait, Cardiopulmonary status limiting activity, Decreased activity tolerance, Decreased mobility, Decreased endurance, Decreased range of motion, Decreased strength, Difficulty walking, Impaired flexibility, Impaired sensation, Improper body mechanics, Pain, Impaired UE functional use  Visit Diagnosis: Muscle weakness (generalized) - Plan: PT plan of care cert/re-cert  Difficulty in walking, not elsewhere classified - Plan: PT plan of care cert/re-cert  Repeated falls - Plan: PT plan of care cert/re-cert  Other lack of coordination - Plan: PT plan of care cert/re-cert     Problem List Patient Active Problem List   Diagnosis Date Noted  . Metabolic syndrome 62/10/5595  . Encounter for preventive health examination 02/17/2016  . Morbid obesity ([place]) 06/17/2015  . Fall at home 12/01/2014  . Hypomagnesemia 04/24/2014  . Hemiparesis affecting left side as late effect of cerebrovascular accident ([place]) 04/24/2014  . Nontraumatic cerebral hemorrhage ([place]) 04/30/2012  . Diabetes type 2, controlled ([place]) 03/04/2010  . OSTEOPENIA 01/21/2009  . UNSPECIFIED VITAMIN D DEFICIENCY 11/19/2007  . ESSENTIAL HYPERTENSION, BENIGN 11/19/2007  . HYPERCHOLESTEROLEMIA 10/25/2006  . GASTROESOPHAGEAL REFLUX, NO ESOPHAGITIS 10/25/2006  . DIVERTICULOSIS OF COLON 10/25/2006  . Osteoarthritis 10/25/2006  . CERVICAL SPINE DISORDER, NOS 10/25/2006    Sumner Boast., PT 01/02/2017, 12:31 PM  [address] [place], [place], [address] Phone: [phone]   Fax:  [phone]  Name: AMANIE MCCULLEY MRN: [id number] Date  of Birth: Feb 06, 1941

## 2017-01-04 ENCOUNTER — Encounter: Payer: Self-pay | Admitting: Physical Therapy

## 2017-01-04 ENCOUNTER — Ambulatory Visit: Payer: Medicare Other | Admitting: Physical Therapy

## 2017-01-04 DIAGNOSIS — R296 Repeated falls: Secondary | ICD-10-CM | POA: Diagnosis not present

## 2017-01-04 DIAGNOSIS — M6281 Muscle weakness (generalized): Secondary | ICD-10-CM | POA: Diagnosis not present

## 2017-01-04 DIAGNOSIS — R2242 Localized swelling, mass and lump, left lower limb: Secondary | ICD-10-CM | POA: Diagnosis not present

## 2017-01-04 DIAGNOSIS — R262 Difficulty in walking, not elsewhere classified: Secondary | ICD-10-CM | POA: Diagnosis not present

## 2017-01-04 DIAGNOSIS — R278 Other lack of coordination: Secondary | ICD-10-CM | POA: Diagnosis not present

## 2017-01-04 NOTE — Therapy (Signed)
[address] [place], [place], [address] Phone: [phone]   Fax:  [phone]  Physical Therapy Treatment  Patient Details  Name: Karen Jackson MRN: [id number] Date of Birth: 1941-05-22 Referring Provider: Andria Frames  Encounter Date: 01/04/2017      PT End of Session - 01/04/17 1046    Visit Number 33   Date for PT Re-Evaluation 02/02/17   PT Start Time 1005   PT Stop Time 1155   PT Time Calculation (min) 110 min   Activity Tolerance Patient limited by pain;Patient limited by fatigue   Behavior During Therapy [facility] for tasks assessed/performed      Past Medical History:  Diagnosis Date  . Arthritis   . Hyperlipidemia   . Hypertension   . Osteopenia     Past Surgical History:  Procedure Laterality Date  . ANKLE SURGERY    . APPENDECTOMY    . CHOLECYSTECTOMY    . HERNIA REPAIR     Esophagus  . JOINT REPLACEMENT     total- right partial- left  . MASTECTOMY PARTIAL / LUMPECTOMY  2012   left    There were no vitals filed for this visit.      Subjective Assessment - 01/04/17 1016    Subjective I am moving better but the knee is still very sore and stiff   Currently in Pain? Yes   Pain Score 4    Pain Location Knee   Pain Orientation Left                         [facility] Adult PT Treatment/Exercise - 01/04/17 0001      Ambulation/Gait   Gait Comments 180' x 2 with HHA, much worse pattern with the left leg being very stiff and out, she had twice that she needed mod/max A to keep from falling forward as she caught her toe on the ground, was able to walk better until a50 feet and then she got stiff and sore and had more issues     High Level Balance   High Level Balance Comments Tried again sone weight shifts and SLS on the left, any weight bearing increased her pain in the left knee to a 6/10     Knee/Hip Exercises: Aerobic   Nustep Level 4 x 6 minutes     Knee/Hip Exercises:  Standing   Other Standing Knee Exercises 3# marches and hip extension and abduction, weight bearing on the left increased left knee pain   Other Standing Knee Exercises red tband HS curls, ankle DF and eversion all 3x10      Knee/Hip Exercises: Seated   Other Seated Knee/Hip Exercises LAQ an SAQ's 2# 3 sets 10 focus on TKE hold 3 sec, SLR's 2# left only     Knee/Hip Exercises: Supine   Other Supine Knee/Hip Exercises feet on ball K2C, trunk rotations and small bridges     Modalities   Modalities Vasopneumatic     Electrical Stimulation   Electrical Stimulation Location left knee   Electrical Stimulation Action IFC   Electrical Stimulation Parameters elevated    Electrical Stimulation Goals Pain;Edema     Vasopneumatic   Number Minutes Vasopneumatic  15 minutes   Vasopnuematic Location  Knee   Vasopneumatic Pressure Medium   Vasopneumatic Temperature  35                  PT Short Term Goals -  09/19/16 1255      PT SHORT TERM GOAL #1   Title independent with initial HEP   Status Achieved           PT Long Term Goals - 01/02/17 1229      PT LONG TERM GOAL #1   Title decrease timed up and go test to 25 seconds   Status On-going     PT LONG TERM GOAL #2   Title increased right LE strength to 4-/5   Status Partially Met     PT LONG TERM GOAL #4   Title no falls over a 6 week period   Status On-going     PT LONG TERM GOAL #5   Title walk 600 feet with supervision and FWW   Status On-going               Plan - 01/04/17 1046    Clinical Impression Statement Patient continues to have increased pain int h eleft knee with weight bearing, she has some swelling of the left knee as well.  She feels that it is a sprain due to mechanism of injury and I agree that she was in a hyper flexed position for 15 minutes until she was moved, however I asked her to call MD about this and possibly get an xray.   PT Next Visit Plan patient reported that she may call MD  but has her son coming in for the weekend   Consulted and Agree with Plan of Care Patient      Patient will benefit from skilled therapeutic intervention in order to improve the following deficits and impairments:     Visit Diagnosis: Muscle weakness (generalized)  Difficulty in walking, not elsewhere classified  Repeated falls  Localized swelling, mass and lump, left lower limb     Problem List Patient Active Problem List   Diagnosis Date Noted  . Metabolic syndrome 71/01/2693  . Encounter for preventive health examination 02/17/2016  . Morbid obesity ([place]) 06/17/2015  . Fall at home 12/01/2014  . Hypomagnesemia 04/24/2014  . Hemiparesis affecting left side as late effect of cerebrovascular accident ([place]) 04/24/2014  . Nontraumatic cerebral hemorrhage ([place]) 04/30/2012  . Diabetes type 2, controlled ([place]) 03/04/2010  . OSTEOPENIA 01/21/2009  . UNSPECIFIED VITAMIN D DEFICIENCY 11/19/2007  . ESSENTIAL HYPERTENSION, BENIGN 11/19/2007  . HYPERCHOLESTEROLEMIA 10/25/2006  . GASTROESOPHAGEAL REFLUX, NO ESOPHAGITIS 10/25/2006  . DIVERTICULOSIS OF COLON 10/25/2006  . Osteoarthritis 10/25/2006  . CERVICAL SPINE DISORDER, NOS 10/25/2006    Sumner Boast., PT 01/04/2017, 10:49 AM  [address] [place], [place], [address] Phone: [phone]   Fax:  [phone]  Name: EMMERSEN GARRAWAY MRN: [id number] Date of Birth: February 03, 1941

## 2017-01-05 DIAGNOSIS — M25562 Pain in left knee: Secondary | ICD-10-CM | POA: Diagnosis not present

## 2017-01-05 DIAGNOSIS — Z96653 Presence of artificial knee joint, bilateral: Secondary | ICD-10-CM | POA: Diagnosis not present

## 2017-01-05 DIAGNOSIS — Z96652 Presence of left artificial knee joint: Secondary | ICD-10-CM | POA: Diagnosis not present

## 2017-01-05 DIAGNOSIS — Z471 Aftercare following joint replacement surgery: Secondary | ICD-10-CM | POA: Diagnosis not present

## 2017-01-05 DIAGNOSIS — Z9181 History of falling: Secondary | ICD-10-CM | POA: Diagnosis not present

## 2017-01-08 ENCOUNTER — Encounter: Payer: Self-pay | Admitting: Physical Therapy

## 2017-01-08 ENCOUNTER — Ambulatory Visit: Payer: Medicare Other | Admitting: Physical Therapy

## 2017-01-08 DIAGNOSIS — R2242 Localized swelling, mass and lump, left lower limb: Secondary | ICD-10-CM

## 2017-01-08 DIAGNOSIS — R262 Difficulty in walking, not elsewhere classified: Secondary | ICD-10-CM

## 2017-01-08 DIAGNOSIS — R296 Repeated falls: Secondary | ICD-10-CM

## 2017-01-08 DIAGNOSIS — R278 Other lack of coordination: Secondary | ICD-10-CM | POA: Diagnosis not present

## 2017-01-08 DIAGNOSIS — M6281 Muscle weakness (generalized): Secondary | ICD-10-CM | POA: Diagnosis not present

## 2017-01-08 NOTE — Therapy (Signed)
[address] [place], [place], [address] Phone: [phone]   Fax:  [phone]  Physical Therapy Treatment  Patient Details  Name: Karen Jackson MRN: [id number] Date of Birth: [birth year] Referring Provider: Andria Frames  Encounter Date: 01/08/2017      PT End of Session - 01/08/17 1201    Visit Number 34   Date for PT Re-Evaluation 02/02/17   PT Start Time 1100   PT Stop Time 1150   PT Time Calculation (min) 50 min   Activity Tolerance Patient limited by pain;Patient limited by fatigue   Behavior During Therapy [facility] for tasks assessed/performed      Past Medical History:  Diagnosis Date  . Arthritis   . Hyperlipidemia   . Hypertension   . Osteopenia     Past Surgical History:  Procedure Laterality Date  . ANKLE SURGERY    . APPENDECTOMY    . CHOLECYSTECTOMY    . HERNIA REPAIR     Esophagus  . JOINT REPLACEMENT     total- right partial- left  . MASTECTOMY PARTIAL / LUMPECTOMY  2012   left    There were no vitals filed for this visit.      Subjective Assessment - 01/08/17 1108    Subjective Patietn went to MD on Friday, he gave her a cortisone injection in the knee, she reports that the pain is better but she is still very unsteady on it, "It buckles a lot"   Currently in Pain? Yes   Pain Score 3    Pain Location Knee   Pain Orientation Left   Pain Descriptors / Indicators Sore                         [facility] Adult PT Treatment/Exercise - 01/08/17 0001      Ambulation/Gait   Gait Comments 180' x 2 slow gait, needed some assist with cues to stabilize the knee as she tends to snap it back into hyper extension     High Level Balance   High Level Balance Comments worked on some weight shift with PT support of the left knee, she tolerated this with less pain than last week, did a few things with her bearing weight on the left but did not want to over do it since she had the  injection on Friday     Knee/Hip Exercises: Aerobic   Nustep Level 4 x 6 minutes   Other Aerobic UBE Level 3 x 4 minutes a lot of cues to keep the left hand on the pedal and use the left had, it slipped off multiple times     Knee/Hip Exercises: Standing   Other Standing Knee Exercises red tband HS curls, ankle DF and eversion all 3x10      Knee/Hip Exercises: Seated   Other Seated Knee/Hip Exercises LAQ an SAQ's 2# 3 sets 10 focus on TKE hold 3 sec, SLR's 2# left only     Knee/Hip Exercises: Supine   Other Supine Knee/Hip Exercises red tband DF and eversion, 2# left hand punches and flexion, blue tband hip extension, 2# SAQ's and trunk rotations   Other Supine Knee/Hip Exercises red tband scapular stabilization left arm only with her focused on the activity     Knee/Hip Exercises: Prone   Hamstring Curl Limitations with red tband sitting     Vasopneumatic   Number Minutes Vasopneumatic  15 minutes   Vasopnuematic Location  Knee   Vasopneumatic Pressure Medium   Vasopneumatic Temperature  35                  PT Short Term Goals - 09/19/16 1255      PT SHORT TERM GOAL #1   Title independent with initial HEP   Status Achieved           PT Long Term Goals - 01/02/17 1229      PT LONG TERM GOAL #1   Title decrease timed up and go test to 25 seconds   Status On-going     PT LONG TERM GOAL #2   Title increased right LE strength to 4-/5   Status Partially Met     PT LONG TERM GOAL #4   Title no falls over a 6 week period   Status On-going     PT LONG TERM GOAL #5   Title walk 600 feet with supervision and FWW   Status On-going               Plan - 01/08/17 1201    Clinical Impression Statement Patient has less pain than last week after a cortisone injection but she still has decreaesd control and stability of th eleft leg, I asked her to look for a brace for the knee that she has at home to see if theis would help her feel safe.  She definitely his  having much more difficulty with all mobility activities due to neglect and decreased LLE control   PT Next Visit Plan slowly add weight bearing as tolerated   Consulted and Agree with Plan of Care Patient      Patient will benefit from skilled therapeutic intervention in order to improve the following deficits and impairments:  Abnormal gait, Cardiopulmonary status limiting activity, Decreased activity tolerance, Decreased mobility, Decreased endurance, Decreased range of motion, Decreased strength, Difficulty walking, Impaired flexibility, Impaired sensation, Improper body mechanics, Pain, Impaired UE functional use  Visit Diagnosis: Muscle weakness (generalized)  Difficulty in walking, not elsewhere classified  Repeated falls  Localized swelling, mass and lump, left lower limb     Problem List Patient Active Problem List   Diagnosis Date Noted  . Metabolic syndrome 01/56/1537  . Encounter for preventive health examination 02/17/2016  . Morbid obesity ([place]) 06/17/2015  . Fall at home 12/01/2014  . Hypomagnesemia 04/24/2014  . Hemiparesis affecting left side as late effect of cerebrovascular accident ([place]) 04/24/2014  . Nontraumatic cerebral hemorrhage ([place]) 04/30/2012  . Diabetes type 2, controlled ([place]) 03/04/2010  . OSTEOPENIA 01/21/2009  . UNSPECIFIED VITAMIN D DEFICIENCY 11/19/2007  . ESSENTIAL HYPERTENSION, BENIGN 11/19/2007  . HYPERCHOLESTEROLEMIA 10/25/2006  . GASTROESOPHAGEAL REFLUX, NO ESOPHAGITIS 10/25/2006  . DIVERTICULOSIS OF COLON 10/25/2006  . Osteoarthritis 10/25/2006  . CERVICAL SPINE DISORDER, NOS 10/25/2006    Sumner Boast., PT 01/08/2017, 12:05 PM  [address] [place], [place], [address] Phone: [phone]   Fax:  [phone]  Name: Karen Jackson MRN: [id number] Date of Birth: 06-Sep-1940

## 2017-01-11 ENCOUNTER — Encounter: Payer: Self-pay | Admitting: Physical Therapy

## 2017-01-11 ENCOUNTER — Ambulatory Visit: Payer: Medicare Other | Admitting: Physical Therapy

## 2017-01-11 DIAGNOSIS — R262 Difficulty in walking, not elsewhere classified: Secondary | ICD-10-CM

## 2017-01-11 DIAGNOSIS — R278 Other lack of coordination: Secondary | ICD-10-CM | POA: Diagnosis not present

## 2017-01-11 DIAGNOSIS — R296 Repeated falls: Secondary | ICD-10-CM | POA: Diagnosis not present

## 2017-01-11 DIAGNOSIS — M6281 Muscle weakness (generalized): Secondary | ICD-10-CM | POA: Diagnosis not present

## 2017-01-11 DIAGNOSIS — R2242 Localized swelling, mass and lump, left lower limb: Secondary | ICD-10-CM

## 2017-01-11 NOTE — Therapy (Signed)
[address] [place], [place], [address] Phone: [phone]   Fax:  [phone]  Physical Therapy Treatment  Patient Details  Name: Karen Jackson MRN: [id number] Date of Birth: 21-Dec-1940 Referring Provider: Andria Frames  Encounter Date: 01/11/2017      PT End of Session - 01/11/17 1142    Visit Number 35   Date for PT Re-Evaluation 02/02/17   PT Start Time 1055   PT Stop Time 5993   PT Time Calculation (min) 54 min   Activity Tolerance Patient limited by pain;Patient limited by fatigue   Behavior During Therapy [facility] for tasks assessed/performed      Past Medical History:  Diagnosis Date  . Arthritis   . Hyperlipidemia   . Hypertension   . Osteopenia     Past Surgical History:  Procedure Laterality Date  . ANKLE SURGERY    . APPENDECTOMY    . CHOLECYSTECTOMY    . HERNIA REPAIR     Esophagus  . JOINT REPLACEMENT     total- right partial- left  . MASTECTOMY PARTIAL / LUMPECTOMY  2012   left    There were no vitals filed for this visit.      Subjective Assessment - 01/11/17 1103    Subjective Patient reports that on Tuesday her chair broke and she was stuck, reports that she had to have two people get her up and she again had her leg and ankle in a "twisted" posisiton, reports incresaed pain and soreness   Currently in Pain? Yes   Pain Score 5    Pain Location Knee   Pain Orientation Left   Pain Descriptors / Indicators Sore                         [facility] Adult PT Treatment/Exercise - 01/11/17 0001      Ambulation/Gait   Gait Comments 180' x 2 slow gait, needed some assist with cues to stabilize the knee as she tends to snap it back into hyper extension, she has now put a brace on the knee and reports it feels a little more stable     Knee/Hip Exercises: Aerobic   Nustep Level 4 x 6 minutes   Other Aerobic UBE Level 3 x 4 minutes a lot of cues to keep the left hand on the  pedal and use the left had, it slipped off multiple times     Knee/Hip Exercises: Machines for Strengthening   Other Machine worked on sitting, reaching, grasping items and placing them on a cone with balance, this took a lot of dexterity and she was not good at this, she got better with cues and having her concentrate and go slower.     Knee/Hip Exercises: Standing   Other Standing Knee Exercises 3# marches and hip extension and abduction, weight bearing on the left increased left knee pain   Other Standing Knee Exercises red tband HS curls, ankle DF and eversion all 3x10      Knee/Hip Exercises: Seated   Other Seated Knee/Hip Exercises LAQ an SAQ's 2# 3 sets 10 focus on TKE hold 3 sec, SLR's 2# left only     Knee/Hip Exercises: Supine   Other Supine Knee/Hip Exercises red tband scapular stabilization left arm only with her focused on the activity     Electrical Stimulation   Electrical Stimulation Location left knee   Electrical Stimulation Action IFC   Electrical Stimulation  Parameters elevated   Electrical Stimulation Goals Pain;Edema                  PT Short Term Goals - 09/19/16 1255      PT SHORT TERM GOAL #1   Title independent with initial HEP   Status Achieved           PT Long Term Goals - 01/11/17 1144      PT LONG TERM GOAL #1   Title decrease timed up and go test to 25 seconds   Status On-going     PT LONG TERM GOAL #2   Title increased right LE strength to 4-/5   Status Partially Met               Plan - 01/11/17 1142    Clinical Impression Statement Pateint with another set back reports her chair at home gave out and she ended up in a position low to the floor and had to have two people get her up.  She reports some increased knee and ankle pain again which was what caused her to have pain previously.  She again is very concerned and when she does this she tends to regress and neglect the left side   PT Next Visit Plan slowly add  weight bearing as tolerated   Consulted and Agree with Plan of Care Patient      Patient will benefit from skilled therapeutic intervention in order to improve the following deficits and impairments:  Abnormal gait, Cardiopulmonary status limiting activity, Decreased activity tolerance, Decreased mobility, Decreased endurance, Decreased range of motion, Decreased strength, Difficulty walking, Impaired flexibility, Impaired sensation, Improper body mechanics, Pain, Impaired UE functional use  Visit Diagnosis: Muscle weakness (generalized)  Difficulty in walking, not elsewhere classified  Repeated falls  Localized swelling, mass and lump, left lower limb     Problem List Patient Active Problem List   Diagnosis Date Noted  . Metabolic syndrome 03/88/8280  . Encounter for preventive health examination 02/17/2016  . Morbid obesity ([place]) 06/17/2015  . Fall at home 12/01/2014  . Hypomagnesemia 04/24/2014  . Hemiparesis affecting left side as late effect of cerebrovascular accident ([place]) 04/24/2014  . Nontraumatic cerebral hemorrhage ([place]) 04/30/2012  . Diabetes type 2, controlled ([place]) 03/04/2010  . OSTEOPENIA 01/21/2009  . UNSPECIFIED VITAMIN D DEFICIENCY 11/19/2007  . ESSENTIAL HYPERTENSION, BENIGN 11/19/2007  . HYPERCHOLESTEROLEMIA 10/25/2006  . GASTROESOPHAGEAL REFLUX, NO ESOPHAGITIS 10/25/2006  . DIVERTICULOSIS OF COLON 10/25/2006  . Osteoarthritis 10/25/2006  . CERVICAL SPINE DISORDER, NOS 10/25/2006    Sumner Boast., PT 01/11/2017, 11:45 AM  [address] [place], [place], [address] Phone: [phone]   Fax:  [phone]  Name: BRYNLEE PENNYWELL MRN: [id number] Date of Birth: 01-06-1941

## 2017-01-15 ENCOUNTER — Encounter: Payer: Self-pay | Admitting: Physical Therapy

## 2017-01-15 ENCOUNTER — Ambulatory Visit: Payer: Medicare Other | Admitting: Physical Therapy

## 2017-01-15 DIAGNOSIS — R262 Difficulty in walking, not elsewhere classified: Secondary | ICD-10-CM

## 2017-01-15 DIAGNOSIS — M6281 Muscle weakness (generalized): Secondary | ICD-10-CM

## 2017-01-15 DIAGNOSIS — R2242 Localized swelling, mass and lump, left lower limb: Secondary | ICD-10-CM | POA: Diagnosis not present

## 2017-01-15 DIAGNOSIS — R296 Repeated falls: Secondary | ICD-10-CM

## 2017-01-15 DIAGNOSIS — R278 Other lack of coordination: Secondary | ICD-10-CM | POA: Diagnosis not present

## 2017-01-15 NOTE — Therapy (Signed)
[place], [place], [address] Phone: [phone]   Fax:  [phone]  Physical Therapy Treatment  Patient Details  Name: DAPHANIE OQUENDO MRN: [id number] Date of Birth: 1940-09-23 Referring Provider: Andria Frames  Encounter Date: 01/15/2017      PT End of Session - 01/15/17 1247    Visit Number 36   Date for PT Re-Evaluation 02/02/17   PT Start Time 1011   PT Stop Time 1055   PT Time Calculation (min) 44 min   Activity Tolerance Patient tolerated treatment well   Behavior During Therapy [facility] for tasks assessed/performed      Past Medical History:  Diagnosis Date  . Arthritis   . Hyperlipidemia   . Hypertension   . Osteopenia     Past Surgical History:  Procedure Laterality Date  . ANKLE SURGERY    . APPENDECTOMY    . CHOLECYSTECTOMY    . HERNIA REPAIR     Esophagus  . JOINT REPLACEMENT     total- right partial- left  . MASTECTOMY PARTIAL / LUMPECTOMY  2012   left    There were no vitals filed for this visit.      Subjective Assessment - 01/15/17 1014    Subjective Pateint reports that she is feeling better, feels like she is walking better, no stumbles or falls over the weekend   Currently in Pain? Yes   Pain Score 3    Pain Location Knee   Pain Orientation Left   Pain Descriptors / Indicators Sore                         [facility] Adult PT Treatment/Exercise - 01/15/17 0001      Ambulation/Gait   Gait Comments 180' x 2 slow gait, needed some assist with cues to stabilize the knee as she tends to snap it back into hyper extension, she has now put a brace on the knee and reports it feels a little more stable     High Level Balance   High Level Balance Activities Side stepping;Backward walking   High Level Balance Comments worked on standing with good posture and trying to bear equal weight on each LE, as she tends to bear all of the weight on the right and lean away  fronm the left     Knee/Hip Exercises: Aerobic   Nustep Level 5 x 6 minutes   Other Aerobic UBE Level 3 x 4 minutes less difficulty with the hand today but still needed cues to pay attention and use the left hand     Knee/Hip Exercises: Machines for Strengthening   Cybex Knee Extension 5# left leg only 2x10, 10# bilaterally 2x10   Cybex Knee Flexion 25# bilaterally 2x10   Cybex Leg Press 20# 2x10 with cues for control of the left knee, then no weight left leg only   Other Machine worked on sitting, reaching, grasping items and placing them on a cone with balance, this took a lot of dexterity and she was not good at this, she got better with cues and having her concentrate and go slower.     Knee/Hip Exercises: Standing   Other Standing Knee Exercises 3# marches and hip extension and abduction, weight bearing on the left increased left knee pain   Other Standing Knee Exercises red tband HS curls, ankle DF and eversion all 3x10      Knee/Hip Exercises: Supine  Other Supine Knee/Hip Exercises red tband scapular stabilization left arm only with her focused on the activity                  PT Short Term Goals - 09/19/16 1255      PT SHORT TERM GOAL #1   Title independent with initial HEP   Status Achieved           PT Long Term Goals - 01/11/17 1144      PT LONG TERM GOAL #1   Title decrease timed up and go test to 25 seconds   Status On-going     PT LONG TERM GOAL #2   Title increased right LE strength to 4-/5   Status Partially Met               Plan - 01/15/17 1247    Clinical Impression Statement Patient did better today, probably her best in the past 3 weeks.  She has less knee buckling and better walking.  She did have difficulty today getting up from sitting from our mat table.  She did better correcting her weight shift with verbal cues today   PT Next Visit Plan slowly add weight bearing as tolerated   Consulted and Agree with Plan of Care Patient       Patient will benefit from skilled therapeutic intervention in order to improve the following deficits and impairments:  Abnormal gait, Cardiopulmonary status limiting activity, Decreased activity tolerance, Decreased mobility, Decreased endurance, Decreased range of motion, Decreased strength, Difficulty walking, Impaired flexibility, Impaired sensation, Improper body mechanics, Pain, Impaired UE functional use  Visit Diagnosis: Muscle weakness (generalized)  Difficulty in walking, not elsewhere classified  Repeated falls     Problem List Patient Active Problem List   Diagnosis Date Noted  . Metabolic syndrome 04/88/8916  . Encounter for preventive health examination 02/17/2016  . Morbid obesity ([place]) 06/17/2015  . Fall at home 12/01/2014  . Hypomagnesemia 04/24/2014  . Hemiparesis affecting left side as late effect of cerebrovascular accident ([place]) 04/24/2014  . Nontraumatic cerebral hemorrhage ([place]) 04/30/2012  . Diabetes type 2, controlled ([place]) 03/04/2010  . OSTEOPENIA 01/21/2009  . UNSPECIFIED VITAMIN D DEFICIENCY 11/19/2007  . ESSENTIAL HYPERTENSION, BENIGN 11/19/2007  . HYPERCHOLESTEROLEMIA 10/25/2006  . GASTROESOPHAGEAL REFLUX, NO ESOPHAGITIS 10/25/2006  . DIVERTICULOSIS OF COLON 10/25/2006  . Osteoarthritis 10/25/2006  . CERVICAL SPINE DISORDER, NOS 10/25/2006    Sumner Boast., PT 01/15/2017, 12:49 PM  [place] [address] [place], [place], [address] Phone: [phone]   Fax:  [phone]  Name: LATONIA CONROW MRN: [id number] Date of Birth: 1941/04/01

## 2017-01-18 ENCOUNTER — Ambulatory Visit: Payer: Medicare Other | Admitting: Physical Therapy

## 2017-01-18 ENCOUNTER — Encounter: Payer: Self-pay | Admitting: Physical Therapy

## 2017-01-18 DIAGNOSIS — R262 Difficulty in walking, not elsewhere classified: Secondary | ICD-10-CM | POA: Diagnosis not present

## 2017-01-18 DIAGNOSIS — R278 Other lack of coordination: Secondary | ICD-10-CM | POA: Diagnosis not present

## 2017-01-18 DIAGNOSIS — R296 Repeated falls: Secondary | ICD-10-CM | POA: Diagnosis not present

## 2017-01-18 DIAGNOSIS — R2242 Localized swelling, mass and lump, left lower limb: Secondary | ICD-10-CM | POA: Diagnosis not present

## 2017-01-18 DIAGNOSIS — M6281 Muscle weakness (generalized): Secondary | ICD-10-CM

## 2017-01-18 NOTE — Therapy (Signed)
[place], [place], [address] Phone: [phone]   Fax:  [phone]  Physical Therapy Treatment  Patient Details  Name: Karen Jackson MRN: [id number] Date of Birth: 01/30/1941 Referring Provider: Andria Frames  Encounter Date: 01/18/2017      PT End of Session - 01/18/17 1127    Visit Number 37   Date for PT Re-Evaluation 02/02/17   PT Start Time 1009   PT Stop Time 1055   PT Time Calculation (min) 46 min   Activity Tolerance Patient tolerated treatment well   Behavior During Therapy [facility] for tasks assessed/performed      Past Medical History:  Diagnosis Date  . Arthritis   . Hyperlipidemia   . Hypertension   . Osteopenia     Past Surgical History:  Procedure Laterality Date  . ANKLE SURGERY    . APPENDECTOMY    . CHOLECYSTECTOMY    . HERNIA REPAIR     Esophagus  . JOINT REPLACEMENT     total- right partial- left  . MASTECTOMY PARTIAL / LUMPECTOMY  2012   left    There were no vitals filed for this visit.      Subjective Assessment - 01/18/17 1018    Subjective Reports that the left ankle is sore today, no falls, reports that the knee brace is helping the knee   Currently in Pain? Yes   Pain Score 4    Pain Location Ankle   Pain Orientation Left                         [facility] Adult PT Treatment/Exercise - 01/18/17 0001      Ambulation/Gait   Gait Comments 180' x 2 slow gait, needed some assist with cues to stabilize the knee as she tends to snap it back into hyper extension, she has now put a brace on the knee and reports it feels a little more stable     High Level Balance   High Level Balance Comments worked on standing with good posture and trying to bear equal weight on each LE, as she tends to bear all of the weight on the right and lean away fronm the left     Knee/Hip Exercises: Aerobic   Nustep Level 5 x 6 minutes     Knee/Hip Exercises: Machines  for Strengthening   Cybex Knee Extension 5# left leg only 2x10, 10# bilaterally 2x10   Cybex Knee Flexion 25# bilaterally 2x10, left only with 10#   Cybex Leg Press 20# 2x10 with cues for control of the left knee, then no weight left leg only     Knee/Hip Exercises: Standing   Other Standing Knee Exercises standing with cues to extend knee and back and have hips aligned.  had her try some exercises in standing with the arm   Other Standing Knee Exercises red tband HS curls, ankle DF and eversion all 3x10      Knee/Hip Exercises: Supine   Other Supine Knee/Hip Exercises red tband scapular stabilization left arm only with her focused on the activity                  PT Short Term Goals - 09/19/16 1255      PT SHORT TERM GOAL #1   Title independent with initial HEP   Status Achieved           PT Long Term Goals -  01/18/17 1132      PT LONG TERM GOAL #4   Title no falls over a 6 week period   Status On-going     PT LONG TERM GOAL #5   Title walk 600 feet with supervision and FWW   Status Partially Met               Plan - 01/18/17 1130    Clinical Impression Statement Patient continues to have issues with her ability to stand and not have neglect of the left side, she tends to hyperextend the left knee rotate the left hip posterior, have flexion in both knees and trunk flexion, she can mostly correct with cues but cannot hold > 5 seconds without repeated cues.  Her knee is feeling better as far as since she had the fall   PT Next Visit Plan work on correcting her neglect   Consulted and Agree with Plan of Care Patient      Patient will benefit from skilled therapeutic intervention in order to improve the following deficits and impairments:  Abnormal gait, Cardiopulmonary status limiting activity, Decreased activity tolerance, Decreased mobility, Decreased endurance, Decreased range of motion, Decreased strength, Difficulty walking, Impaired flexibility,  Impaired sensation, Improper body mechanics, Pain, Impaired UE functional use  Visit Diagnosis: Muscle weakness (generalized)  Difficulty in walking, not elsewhere classified  Repeated falls     Problem List Patient Active Problem List   Diagnosis Date Noted  . Metabolic syndrome 36/07/2448  . Encounter for preventive health examination 02/17/2016  . Morbid obesity ([place]) 06/17/2015  . Fall at home 12/01/2014  . Hypomagnesemia 04/24/2014  . Hemiparesis affecting left side as late effect of cerebrovascular accident ([place]) 04/24/2014  . Nontraumatic cerebral hemorrhage ([place]) 04/30/2012  . Diabetes type 2, controlled ([place]) 03/04/2010  . OSTEOPENIA 01/21/2009  . UNSPECIFIED VITAMIN D DEFICIENCY 11/19/2007  . ESSENTIAL HYPERTENSION, BENIGN 11/19/2007  . HYPERCHOLESTEROLEMIA 10/25/2006  . GASTROESOPHAGEAL REFLUX, NO ESOPHAGITIS 10/25/2006  . DIVERTICULOSIS OF COLON 10/25/2006  . Osteoarthritis 10/25/2006  . CERVICAL SPINE DISORDER, NOS 10/25/2006    Sumner Boast., PT 01/18/2017, 11:33 AM  [place] [address] [place], [place], [address] Phone: [phone]   Fax:  [phone]  Name: Karen Jackson MRN: [id number] Date of Birth: [birth year]

## 2017-01-23 ENCOUNTER — Ambulatory Visit: Payer: Medicare Other | Admitting: Physical Therapy

## 2017-01-23 ENCOUNTER — Encounter: Payer: Self-pay | Admitting: Physical Therapy

## 2017-01-23 DIAGNOSIS — M6281 Muscle weakness (generalized): Secondary | ICD-10-CM

## 2017-01-23 DIAGNOSIS — R2242 Localized swelling, mass and lump, left lower limb: Secondary | ICD-10-CM | POA: Diagnosis not present

## 2017-01-23 DIAGNOSIS — R262 Difficulty in walking, not elsewhere classified: Secondary | ICD-10-CM

## 2017-01-23 DIAGNOSIS — R278 Other lack of coordination: Secondary | ICD-10-CM | POA: Diagnosis not present

## 2017-01-23 DIAGNOSIS — R296 Repeated falls: Secondary | ICD-10-CM

## 2017-01-23 NOTE — Therapy (Signed)
[place], [place], [address] Phone: [phone]   Fax:  [phone]  Physical Therapy Treatment  Patient Details  Name: Karen Jackson MRN: [id number] Date of Birth: 1941-07-16 Referring Provider: Andria Frames  Encounter Date: 01/23/2017      PT End of Session - 01/23/17 1153    Visit Number 38   Date for PT Re-Evaluation 02/02/17   PT Start Time 1009   PT Stop Time 1053   PT Time Calculation (min) 44 min   Activity Tolerance Patient tolerated treatment well   Behavior During Therapy [facility] for tasks assessed/performed      Past Medical History:  Diagnosis Date  . Arthritis   . Hyperlipidemia   . Hypertension   . Osteopenia     Past Surgical History:  Procedure Laterality Date  . ANKLE SURGERY    . APPENDECTOMY    . CHOLECYSTECTOMY    . HERNIA REPAIR     Esophagus  . JOINT REPLACEMENT     total- right partial- left  . MASTECTOMY PARTIAL / LUMPECTOMY  2012   left    There were no vitals filed for this visit.      Subjective Assessment - 01/23/17 1013    Subjective No falls over the weekend, she reports that she sat a lot and did not get the leg elevated, reports some increase in left knee pain today, also reports that the wet weather has something to do with the pain   Currently in Pain? Yes   Pain Score 4    Pain Location Knee   Pain Orientation Left   Aggravating Factors  sitting too long, wet weather                         [facility] Adult PT Treatment/Exercise - 01/23/17 0001      Transfers   Comments worked on sit to stand  without UE support and then self correcting posture, also worked on safe transfers and having her completely turn and square before sitting, when she gets tires she will get about a quarter turned and really just plop into a chair     Ambulation/Gait   Gait Comments 180' x2, HHA, some cues for step length an dto pick up foot.  On the way out  she really had to rest twice due to the left knee sstarting to give     High Level Balance   High Level Balance Comments worked on standing with good posture and trying to bear equal weight on each LE, as she tends to bear all of the weight on the right and lean away fronm the left     Knee/Hip Exercises: Aerobic   Nustep Level 5 x 6 minutes, cues to keep speed above 70 RPM   Other Aerobic UBE Level 3 x 5 minutes less difficulty with the hand today but still needed cues to pay attention and use the left hand     Knee/Hip Exercises: Machines for Strengthening   Other Machine 5# HS curl, 5# left arm pulls 2 ways     Knee/Hip Exercises: Standing   Other Standing Knee Exercises red tband HS curls, ankle DF and eversion all 3x10      Knee/Hip Exercises: Seated   Other Seated Knee/Hip Exercises left arm pushesfor core activation, left arm activation and sitting balance  PT Short Term Goals - 09/19/16 1255      PT SHORT TERM GOAL #1   Title independent with initial HEP   Status Achieved           PT Long Term Goals - 01/18/17 1132      PT LONG TERM GOAL #4   Title no falls over a 6 week period   Status On-going     PT LONG TERM GOAL #5   Title walk 600 feet with supervision and FWW   Status Partially Met               Plan - 01/23/17 1154    Clinical Impression Statement Patient has ups and downs and it does happen with the weather and the weekend, I encouraged her today to give more effort with the exercises, asked her to stay above 70 RPM on the Nustep etc... as she will find the easiest way if there are no verbal cues   Clinical Presentation Stable   Clinical Decision Making Moderate   PT Next Visit Plan work on correcting her neglect   Consulted and Agree with Plan of Care Patient      Patient will benefit from skilled therapeutic intervention in order to improve the following deficits and impairments:  Abnormal gait, Cardiopulmonary  status limiting activity, Decreased activity tolerance, Decreased mobility, Decreased endurance, Decreased range of motion, Decreased strength, Difficulty walking, Impaired flexibility, Impaired sensation, Improper body mechanics, Pain, Impaired UE functional use  Visit Diagnosis: Muscle weakness (generalized)  Difficulty in walking, not elsewhere classified  Repeated falls     Problem List Patient Active Problem List   Diagnosis Date Noted  . Metabolic syndrome 07/19/6243  . Encounter for preventive health examination 02/17/2016  . Morbid obesity ([place]) 06/17/2015  . Fall at home 12/01/2014  . Hypomagnesemia 04/24/2014  . Hemiparesis affecting left side as late effect of cerebrovascular accident ([place]) 04/24/2014  . Nontraumatic cerebral hemorrhage ([place]) 04/30/2012  . Diabetes type 2, controlled ([place]) 03/04/2010  . OSTEOPENIA 01/21/2009  . UNSPECIFIED VITAMIN D DEFICIENCY 11/19/2007  . ESSENTIAL HYPERTENSION, BENIGN 11/19/2007  . HYPERCHOLESTEROLEMIA 10/25/2006  . GASTROESOPHAGEAL REFLUX, NO ESOPHAGITIS 10/25/2006  . DIVERTICULOSIS OF COLON 10/25/2006  . Osteoarthritis 10/25/2006  . CERVICAL SPINE DISORDER, NOS 10/25/2006    Sumner Boast., PT 01/23/2017, 12:00 PM  [place] [address] [place], [place], [address] Phone: [phone]   Fax:  [phone]  Name: Karen Jackson MRN: [id number] Date of Birth: 01-17-1941

## 2017-01-25 ENCOUNTER — Ambulatory Visit: Payer: Medicare Other | Admitting: Physical Therapy

## 2017-01-25 ENCOUNTER — Encounter: Payer: Self-pay | Admitting: Physical Therapy

## 2017-01-25 DIAGNOSIS — R2242 Localized swelling, mass and lump, left lower limb: Secondary | ICD-10-CM

## 2017-01-25 DIAGNOSIS — R262 Difficulty in walking, not elsewhere classified: Secondary | ICD-10-CM | POA: Diagnosis not present

## 2017-01-25 DIAGNOSIS — M6281 Muscle weakness (generalized): Secondary | ICD-10-CM

## 2017-01-25 DIAGNOSIS — R296 Repeated falls: Secondary | ICD-10-CM | POA: Diagnosis not present

## 2017-01-25 DIAGNOSIS — R278 Other lack of coordination: Secondary | ICD-10-CM | POA: Diagnosis not present

## 2017-01-25 NOTE — Therapy (Signed)
[place], [place], [address] Phone: [phone]   Fax:  [phone]  Physical Therapy Treatment  Patient Details  Name: Karen Jackson MRN: [id number] Date of Birth: 10/02/1940 Referring Provider: Andria Frames  Encounter Date: 01/25/2017      PT End of Session - 01/25/17 1038    Visit Number 39   Date for PT Re-Evaluation 02/02/17   PT Start Time 1010   PT Stop Time 1100   PT Time Calculation (min) 50 min   Activity Tolerance Patient limited by pain   Behavior During Therapy [facility] for tasks assessed/performed      Past Medical History:  Diagnosis Date  . Arthritis   . Hyperlipidemia   . Hypertension   . Osteopenia     Past Surgical History:  Procedure Laterality Date  . ANKLE SURGERY    . APPENDECTOMY    . CHOLECYSTECTOMY    . HERNIA REPAIR     Esophagus  . JOINT REPLACEMENT     total- right partial- left  . MASTECTOMY PARTIAL / LUMPECTOMY  2012   left    There were no vitals filed for this visit.      Subjective Assessment - 01/25/17 1017    Subjective Patient reports that she had increased left knee pain and swelling after the last treatment.  She thinks it was the balance activities we did.  She reports that it is a little better today but still hurting and swelling   Currently in Pain? Yes   Pain Score 6    Pain Location Knee   Pain Orientation Left                         [facility] Adult PT Treatment/Exercise - 01/25/17 0001      Ambulation/Gait   Gait Comments 180' x2, HHA, some cues for step length an dto pick up foot.  On the way out she really had to rest twice due to the left knee sstarting to give     Knee/Hip Exercises: Aerobic   Nustep Level 4 x 6 minutes, cues to keep speed above 70 RPM     Knee/Hip Exercises: Standing   Other Standing Knee Exercises red tband HS curls, ankle DF and eversion all 3x10      Knee/Hip Exercises: Seated   Other Seated  Knee/Hip Exercises left arm pushesfor core activation, left arm activation and sitting balance     Knee/Hip Exercises: Supine   Other Supine Knee/Hip Exercises red tband scapular stabilization left arm only with her focused on the activity     Moist Heat Therapy   Number Minutes Moist Heat 15 Minutes   Moist Heat Location Shoulder     Electrical Stimulation   Electrical Stimulation Location left knee   Electrical Stimulation Action IFC   Electrical Stimulation Parameters elevated   Electrical Stimulation Goals Pain;Edema     Vasopneumatic   Number Minutes Vasopneumatic  15 minutes   Vasopnuematic Location  Knee   Vasopneumatic Pressure Medium   Vasopneumatic Temperature  35                  PT Short Term Goals - 09/19/16 1255      PT SHORT TERM GOAL #1   Title independent with initial HEP   Status Achieved           PT Long Term Goals - 01/18/17 1132  PT LONG TERM GOAL #4   Title no falls over a 6 week period   Status On-going     PT LONG TERM GOAL #5   Title walk 600 feet with supervision and FWW   Status Partially Met               Plan - 01/25/17 1038    Clinical Impression Statement Patient with pain and swelling in the left knee, she feels like it was the balance and standing activities that we did the last treatment.  Tried to not do any balance activtities today   PT Next Visit Plan may d/c next week as she has reached a place that we are stuck with progression and has had some increased issues with knee   Consulted and Agree with Plan of Care Patient      Patient will benefit from skilled therapeutic intervention in order to improve the following deficits and impairments:  Abnormal gait, Cardiopulmonary status limiting activity, Decreased activity tolerance, Decreased mobility, Decreased endurance, Decreased range of motion, Decreased strength, Difficulty walking, Impaired flexibility, Impaired sensation, Improper body mechanics, Pain,  Impaired UE functional use  Visit Diagnosis: Muscle weakness (generalized)  Difficulty in walking, not elsewhere classified  Repeated falls  Localized swelling, mass and lump, left lower limb     Problem List Patient Active Problem List   Diagnosis Date Noted  . Metabolic syndrome 28/20/8138  . Encounter for preventive health examination 02/17/2016  . Morbid obesity ([place]) 06/17/2015  . Fall at home 12/01/2014  . Hypomagnesemia 04/24/2014  . Hemiparesis affecting left side as late effect of cerebrovascular accident ([place]) 04/24/2014  . Nontraumatic cerebral hemorrhage ([place]) 04/30/2012  . Diabetes type 2, controlled ([place]) 03/04/2010  . OSTEOPENIA 01/21/2009  . UNSPECIFIED VITAMIN D DEFICIENCY 11/19/2007  . ESSENTIAL HYPERTENSION, BENIGN 11/19/2007  . HYPERCHOLESTEROLEMIA 10/25/2006  . GASTROESOPHAGEAL REFLUX, NO ESOPHAGITIS 10/25/2006  . DIVERTICULOSIS OF COLON 10/25/2006  . Osteoarthritis 10/25/2006  . CERVICAL SPINE DISORDER, NOS 10/25/2006    Sumner Boast., PT 01/25/2017, 10:41 AM  [place] [address] [place], [place], [address] Phone: [phone]   Fax:  [phone]  Name: RILYN SCROGGS MRN: [id number] Date of Birth: May 17, 1941

## 2017-01-29 ENCOUNTER — Ambulatory Visit: Payer: Medicare Other | Attending: Family Medicine | Admitting: Physical Therapy

## 2017-01-29 ENCOUNTER — Encounter: Payer: Self-pay | Admitting: Physical Therapy

## 2017-01-29 DIAGNOSIS — R262 Difficulty in walking, not elsewhere classified: Secondary | ICD-10-CM | POA: Diagnosis not present

## 2017-01-29 DIAGNOSIS — M6281 Muscle weakness (generalized): Secondary | ICD-10-CM | POA: Insufficient documentation

## 2017-01-29 DIAGNOSIS — R296 Repeated falls: Secondary | ICD-10-CM | POA: Diagnosis not present

## 2017-01-29 NOTE — Therapy (Signed)
[address] [place], [place], [address] Phone: [phone]   Fax:  [phone]  Physical Therapy Treatment  Patient Details  Name: Karen Jackson MRN: [id number] Date of Birth: [birth year] Referring Provider: Andria Frames  Encounter Date: 01/29/2017      PT End of Session - 01/29/17 1152    Visit Number 40   Date for PT Re-Evaluation 02/02/17   PT Start Time 1100   PT Stop Time 1150   PT Time Calculation (min) 50 min   Activity Tolerance Patient limited by pain;Patient tolerated treatment well   Behavior During Therapy [facility] for tasks assessed/performed      Past Medical History:  Diagnosis Date  . Arthritis   . Hyperlipidemia   . Hypertension   . Osteopenia     Past Surgical History:  Procedure Laterality Date  . ANKLE SURGERY    . APPENDECTOMY    . CHOLECYSTECTOMY    . HERNIA REPAIR     Esophagus  . JOINT REPLACEMENT     total- right partial- left  . MASTECTOMY PARTIAL / LUMPECTOMY  2012   left    There were no vitals filed for this visit.      Subjective Assessment - 01/29/17 1149    Subjective Patient reports that the left knee and ankle are feeling better, but that she is still afraid that they will buckle or roll and she will fall   Currently in Pain? Yes   Pain Score 2    Pain Location Knee   Pain Orientation Left                         [facility] Adult PT Treatment/Exercise - 01/29/17 0001      Transfers   Comments worked on sit to stand  without UE support and then self correcting posture, also worked on safe transfers and having her completely turn and square before sitting, when she gets tires she will get about a quarter turned and really just plop into a chair     Ambulation/Gait   Gait Comments 180' x2, HHA, some cues for step length an dto pick up foot.  On the way out she really had to rest twice due to the left knee sstarting to give     Knee/Hip Exercises:  Aerobic   Nustep Level 5 x 5 minutes, cues to keep speed above 70 RPM     Knee/Hip Exercises: Standing   Other Standing Knee Exercises red tband HS curls, ankle DF and eversion all 3x10 , trunk rotations with red tband     Knee/Hip Exercises: Seated   Other Seated Knee/Hip Exercises left arm pushesfor core activation, left arm activation and sitting balance, hip abduction with red tband, hip ER/IR with yellow tband and hip adduction with ball b/n knees     Knee/Hip Exercises: Supine   Other Supine Knee/Hip Exercises red tband scapular stabilization left arm only with her focused on the activity, punches with red tband                  PT Short Term Goals - 09/19/16 1255      PT SHORT TERM GOAL #1   Title independent with initial HEP   Status Achieved           PT Long Term Goals - 01/18/17 1132      PT LONG TERM GOAL #4   Title no falls  over a 6 week period   Status On-going     PT LONG TERM GOAL #5   Title walk 600 feet with supervision and FWW   Status Partially Met               Plan - Feb 08, 2017 1152    Clinical Impression Statement Patient reported that she did not feel like she was ready for th eweight bearing on the left activities as she is worried it will make her sore.  The fall that she had really did cause her to regress, as we were making some good progress, we are close the the Medicare cap and I feel that the insurance will be our limiting factor, i have spoken with her about this and she understands, I am worried about her doing things on her own as she has demonstrated that she regresses when she tries this as she does not push herself and usually will find easy ways and compensate.   PT Next Visit Plan Go over what she can and should do at home   Consulted and Agree with Plan of Care Patient      Patient will benefit from skilled therapeutic intervention in order to improve the following deficits and impairments:  Abnormal gait,  Cardiopulmonary status limiting activity, Decreased activity tolerance, Decreased mobility, Decreased endurance, Decreased range of motion, Decreased strength, Difficulty walking, Impaired flexibility, Impaired sensation, Improper body mechanics, Pain, Impaired UE functional use  Visit Diagnosis: Muscle weakness (generalized)  Difficulty in walking, not elsewhere classified  Repeated falls       G-Codes - 08-Feb-2017 1200    Functional Assessment Tool Used (Outpatient Only) foto 55% limited   Functional Limitation Mobility: Walking and moving around   Mobility: Walking and Moving Around Current Status [phone]) At least 40 percent but less than 60 percent impaired, limited or restricted   Mobility: Walking and Moving Around Goal Status [phone]) At least 40 percent but less than 60 percent impaired, limited or restricted      Problem List Patient Active Problem List   Diagnosis Date Noted  . Metabolic syndrome 82/51/8984  . Encounter for preventive health examination 02/17/2016  . Morbid obesity ([place]) 06/17/2015  . Fall at home 12/01/2014  . Hypomagnesemia 04/24/2014  . Hemiparesis affecting left side as late effect of cerebrovascular accident ([place]) 04/24/2014  . Nontraumatic cerebral hemorrhage ([place]) 04/30/2012  . Diabetes type 2, controlled ([place]) 03/04/2010  . OSTEOPENIA 01/21/2009  . UNSPECIFIED VITAMIN D DEFICIENCY 11/19/2007  . ESSENTIAL HYPERTENSION, BENIGN 11/19/2007  . HYPERCHOLESTEROLEMIA 10/25/2006  . GASTROESOPHAGEAL REFLUX, NO ESOPHAGITIS 10/25/2006  . DIVERTICULOSIS OF COLON 10/25/2006  . Osteoarthritis 10/25/2006  . CERVICAL SPINE DISORDER, NOS 10/25/2006    Sumner Boast., PT 02-08-2017, 12:00 PM  [address] [place], [place], [address] Phone: [phone]   Fax:  [phone]  Name: Karen Jackson MRN: [id number] Date of Birth: 12/27/1940

## 2017-02-01 ENCOUNTER — Other Ambulatory Visit: Payer: Self-pay | Admitting: Family Medicine

## 2017-02-02 ENCOUNTER — Ambulatory Visit: Payer: Medicare Other | Admitting: Physical Therapy

## 2017-02-02 ENCOUNTER — Encounter: Payer: Self-pay | Admitting: Physical Therapy

## 2017-02-02 DIAGNOSIS — R262 Difficulty in walking, not elsewhere classified: Secondary | ICD-10-CM

## 2017-02-02 DIAGNOSIS — M6281 Muscle weakness (generalized): Secondary | ICD-10-CM | POA: Diagnosis not present

## 2017-02-02 DIAGNOSIS — R296 Repeated falls: Secondary | ICD-10-CM

## 2017-02-02 NOTE — Therapy (Signed)
[address] [place], [place], [address] Phone: [phone]   Fax:  [phone]  Physical Therapy Treatment  Patient Details  Name: Karen Jackson MRN: [id number] Date of Birth: 26-Jan-1941 Referring Provider: Andria Frames  Encounter Date: 02/02/2017      PT End of Session - 02/02/17 1149    Visit Number 19   PT Start Time 3888   PT Stop Time 1140   PT Time Calculation (min) 45 min   Activity Tolerance Patient tolerated treatment well   Behavior During Therapy [facility] for tasks assessed/performed      Past Medical History:  Diagnosis Date  . Arthritis   . Hyperlipidemia   . Hypertension   . Osteopenia     Past Surgical History:  Procedure Laterality Date  . ANKLE SURGERY    . APPENDECTOMY    . CHOLECYSTECTOMY    . HERNIA REPAIR     Esophagus  . JOINT REPLACEMENT     total- right partial- left  . MASTECTOMY PARTIAL / LUMPECTOMY  2012   left    There were no vitals filed for this visit.      Subjective Assessment - 02/02/17 1107    Subjective Continues to report that her left knee is very sore, she again has the brace in a poor position.  We took it off.   Currently in Pain? Yes   Pain Score 2    Pain Location Knee   Pain Orientation Left                         [facility] Adult PT Treatment/Exercise - 02/02/17 0001      Transfers   Comments worked on transfers having her place the left foot under her as she has been tending to have the left foot out which may be placing medial knee stress, she did better with this today as well as fully turning to sit     Ambulation/Gait   Gait Comments 180' x2, HHA, some cues for step length an dto pick up foot.  On the way out she really had to rest twice due to the left knee sstarting to give     High Level Balance   High Level Balance Activities Side stepping;Backward walking     Knee/Hip Exercises: Aerobic   Nustep Level 5 x 5 minutes, cues  to keep speed above 70 RPM   Other Aerobic UBE Level 5 x 5 minutes less difficulty with the hand today but still needed cues to pay attention and use the left hand     Knee/Hip Exercises: Machines for Strengthening   Other Machine 5# HS curl, 5# left arm pulls 2 ways     Knee/Hip Exercises: Standing   Other Standing Knee Exercises standing 2.5# marches and hip abduction   Other Standing Knee Exercises red tband HS curls, ankle DF and eversion all 3x10 , trunk rotations with red tband     Knee/Hip Exercises: Seated   Other Seated Knee/Hip Exercises left arm pushesfor core activation, left arm activation and sitting balance, hip abduction with red tband, hip ER/IR with yellow tband and hip adduction with ball b/n knees                  PT Short Term Goals - 09/19/16 1255      PT SHORT TERM GOAL #1   Title independent with initial HEP   Status Achieved  PT Long Term Goals - 2017/02/07 1155      PT LONG TERM GOAL #1   Title decrease timed up and go test to 25 seconds   Status Partially Met     PT LONG TERM GOAL #2   Title increased right LE strength to 4-/5   Status Partially Met     PT LONG TERM GOAL #3   Title increased left LE strength to 4-/5 for the hip and knee   Status Partially Met     PT LONG TERM GOAL #4   Title no falls over a 6 week period   Status Not Met     PT LONG TERM GOAL #5   Title walk 600 feet with supervision and FWW   Status Partially Met               Plan - 02/07/17 1150    Clinical Impression Statement Patient was able to tolerate weight bearing on the left leg today with less pain.  She did better and was safer with her transfers.  She still has a lot of issues with neglect of the left side, she tends to have great difficulty with multi tasking, and will have incresaed neglect and poor safety.  She really regressed after her fall but as stated in earlier note we are at the Medicare cap, her progress is slow and I have  educated her that she has to stay active and push herself instead of allowing others to help her at home.   PT Next Visit Plan we will d/c, she is to walk, and do HEP.   Consulted and Agree with Plan of Care Patient      Patient will benefit from skilled therapeutic intervention in order to improve the following deficits and impairments:     Visit Diagnosis: Muscle weakness (generalized)  Difficulty in walking, not elsewhere classified  Repeated falls       G-Codes - 2017-02-07 1155    Functional Assessment Tool Used (Outpatient Only) foto 55% limited   Functional Limitation Mobility: Walking and moving around   Mobility: Walking and Moving Around Goal Status [phone]) At least 40 percent but less than 60 percent impaired, limited or restricted   Mobility: Walking and Moving Around Discharge Status [phone]) At least 40 percent but less than 60 percent impaired, limited or restricted      Problem List Patient Active Problem List   Diagnosis Date Noted  . Metabolic syndrome 72/90/2111  . Encounter for preventive health examination 02/17/2016  . Morbid obesity ([place]) 06/17/2015  . Fall at home 12/01/2014  . Hypomagnesemia 04/24/2014  . Hemiparesis affecting left side as late effect of cerebrovascular accident ([place]) 04/24/2014  . Nontraumatic cerebral hemorrhage ([place]) 04/30/2012  . Diabetes type 2, controlled ([place]) 03/04/2010  . OSTEOPENIA 01/21/2009  . UNSPECIFIED VITAMIN D DEFICIENCY 11/19/2007  . ESSENTIAL HYPERTENSION, BENIGN 11/19/2007  . HYPERCHOLESTEROLEMIA 10/25/2006  . GASTROESOPHAGEAL REFLUX, NO ESOPHAGITIS 10/25/2006  . DIVERTICULOSIS OF COLON 10/25/2006  . Osteoarthritis 10/25/2006  . CERVICAL SPINE DISORDER, NOS 10/25/2006    Sumner Boast., PT Feb 07, 2017, 11:56 AM  [address] [place], [place], [address] Phone: [phone]   Fax:  [phone]  Name: SYRAH DAUGHTREY MRN:  [id number] Date of Birth: 30-Jul-1941

## 2017-02-08 ENCOUNTER — Other Ambulatory Visit: Payer: Self-pay | Admitting: Family Medicine

## 2017-02-08 DIAGNOSIS — I10 Essential (primary) hypertension: Secondary | ICD-10-CM

## 2017-02-15 ENCOUNTER — Other Ambulatory Visit: Payer: Self-pay | Admitting: Family Medicine

## 2017-02-15 DIAGNOSIS — I10 Essential (primary) hypertension: Secondary | ICD-10-CM

## 2017-02-16 ENCOUNTER — Telehealth: Payer: Self-pay | Admitting: Family Medicine

## 2017-02-16 MED ORDER — LOSARTAN POTASSIUM-HCTZ 100-12.5 MG PO TABS: 1.0000 | ORAL_TABLET | Freq: Every day | ORAL | 3 refills | Status: DC

## 2017-02-16 NOTE — Telephone Encounter (Signed)
Pharmacy called because the patient requesting a refill on her hydrochlorothiazide and Losartan. The pharmacy received a fax from the doctor saying that the hydrochlorothiazide has been denied since she doesn't take this. He said that the patient is confused and unsure why the doctor would say that. Can we call the patient and explain to her which medication she is suppose to be taking and also send in a prescription for the patients Losartan. jw

## 2017-02-16 NOTE — Telephone Encounter (Signed)
Reviewed record and spoke to patient.  She is taking losartan HCTZ

## 2017-03-19 ENCOUNTER — Inpatient Hospital Stay (HOSPITAL_COMMUNITY)
Admission: EM | Admit: 2017-03-19 | Discharge: 2017-03-28 | DRG: 871 | Disposition: A | Discharge status: Skilled Nursing Facility | Payer: Medicare Other | Attending: Family Medicine | Admitting: Family Medicine

## 2017-03-19 ENCOUNTER — Emergency Department (HOSPITAL_COMMUNITY): Payer: Medicare Other

## 2017-03-19 ENCOUNTER — Ambulatory Visit (INDEPENDENT_AMBULATORY_CARE_PROVIDER_SITE_OTHER): Payer: Medicare Other | Admitting: Internal Medicine

## 2017-03-19 ENCOUNTER — Emergency Department (HOSPITAL_COMMUNITY)
Admit: 2017-03-19 | Discharge: 2017-03-19 | Disposition: A | Discharge status: Home / Self Care | Payer: Medicare Other | Attending: Emergency Medicine | Admitting: Emergency Medicine

## 2017-03-19 ENCOUNTER — Inpatient Hospital Stay (HOSPITAL_COMMUNITY): Payer: Medicare Other

## 2017-03-19 ENCOUNTER — Encounter (HOSPITAL_COMMUNITY): Payer: Self-pay | Admitting: Emergency Medicine

## 2017-03-19 DIAGNOSIS — Z8673 Personal history of transient ischemic attack (TIA), and cerebral infarction without residual deficits: Secondary | ICD-10-CM

## 2017-03-19 DIAGNOSIS — Z833 Family history of diabetes mellitus: Secondary | ICD-10-CM | POA: Diagnosis not present

## 2017-03-19 DIAGNOSIS — E876 Hypokalemia: Secondary | ICD-10-CM | POA: Diagnosis not present

## 2017-03-19 DIAGNOSIS — I340 Nonrheumatic mitral (valve) insufficiency: Secondary | ICD-10-CM | POA: Diagnosis not present

## 2017-03-19 DIAGNOSIS — Y92230 Patient room in hospital as the place of occurrence of the external cause: Secondary | ICD-10-CM | POA: Diagnosis not present

## 2017-03-19 DIAGNOSIS — J9690 Respiratory failure, unspecified, unspecified whether with hypoxia or hypercapnia: Secondary | ICD-10-CM | POA: Diagnosis not present

## 2017-03-19 DIAGNOSIS — E875 Hyperkalemia: Secondary | ICD-10-CM | POA: Diagnosis not present

## 2017-03-19 DIAGNOSIS — I69354 Hemiplegia and hemiparesis following cerebral infarction affecting left non-dominant side: Secondary | ICD-10-CM

## 2017-03-19 DIAGNOSIS — I493 Ventricular premature depolarization: Secondary | ICD-10-CM | POA: Diagnosis present

## 2017-03-19 DIAGNOSIS — R0603 Acute respiratory distress: Secondary | ICD-10-CM | POA: Diagnosis not present

## 2017-03-19 DIAGNOSIS — I482 Chronic atrial fibrillation: Secondary | ICD-10-CM | POA: Diagnosis not present

## 2017-03-19 DIAGNOSIS — K219 Gastro-esophageal reflux disease without esophagitis: Secondary | ICD-10-CM | POA: Diagnosis present

## 2017-03-19 DIAGNOSIS — I959 Hypotension, unspecified: Secondary | ICD-10-CM | POA: Diagnosis not present

## 2017-03-19 DIAGNOSIS — N3000 Acute cystitis without hematuria: Secondary | ICD-10-CM | POA: Insufficient documentation

## 2017-03-19 DIAGNOSIS — I4891 Unspecified atrial fibrillation: Secondary | ICD-10-CM

## 2017-03-19 DIAGNOSIS — M8580 Other specified disorders of bone density and structure, unspecified site: Secondary | ICD-10-CM | POA: Diagnosis present

## 2017-03-19 DIAGNOSIS — I481 Persistent atrial fibrillation: Secondary | ICD-10-CM | POA: Diagnosis present

## 2017-03-19 DIAGNOSIS — I110 Hypertensive heart disease with heart failure: Secondary | ICD-10-CM | POA: Diagnosis present

## 2017-03-19 DIAGNOSIS — Z888 Allergy status to other drugs, medicaments and biological substances status: Secondary | ICD-10-CM

## 2017-03-19 DIAGNOSIS — Z79899 Other long term (current) drug therapy: Secondary | ICD-10-CM

## 2017-03-19 DIAGNOSIS — E7800 Pure hypercholesterolemia, unspecified: Secondary | ICD-10-CM | POA: Diagnosis not present

## 2017-03-19 DIAGNOSIS — E559 Vitamin D deficiency, unspecified: Secondary | ICD-10-CM | POA: Diagnosis not present

## 2017-03-19 DIAGNOSIS — I10 Essential (primary) hypertension: Secondary | ICD-10-CM | POA: Diagnosis not present

## 2017-03-19 DIAGNOSIS — R0902 Hypoxemia: Secondary | ICD-10-CM | POA: Diagnosis not present

## 2017-03-19 DIAGNOSIS — E8881 Metabolic syndrome: Secondary | ICD-10-CM | POA: Diagnosis present

## 2017-03-19 DIAGNOSIS — J189 Pneumonia, unspecified organism: Secondary | ICD-10-CM | POA: Diagnosis present

## 2017-03-19 DIAGNOSIS — K5790 Diverticulosis of intestine, part unspecified, without perforation or abscess without bleeding: Secondary | ICD-10-CM | POA: Diagnosis not present

## 2017-03-19 DIAGNOSIS — E785 Hyperlipidemia, unspecified: Secondary | ICD-10-CM | POA: Diagnosis present

## 2017-03-19 DIAGNOSIS — M79605 Pain in left leg: Secondary | ICD-10-CM

## 2017-03-19 DIAGNOSIS — I5033 Acute on chronic diastolic (congestive) heart failure: Secondary | ICD-10-CM | POA: Diagnosis present

## 2017-03-19 DIAGNOSIS — I5032 Chronic diastolic (congestive) heart failure: Secondary | ICD-10-CM | POA: Diagnosis present

## 2017-03-19 DIAGNOSIS — A419 Sepsis, unspecified organism: Principal | ICD-10-CM | POA: Insufficient documentation

## 2017-03-19 DIAGNOSIS — I4892 Unspecified atrial flutter: Secondary | ICD-10-CM | POA: Diagnosis present

## 2017-03-19 DIAGNOSIS — R0602 Shortness of breath: Secondary | ICD-10-CM | POA: Diagnosis not present

## 2017-03-19 DIAGNOSIS — W06XXXA Fall from bed, initial encounter: Secondary | ICD-10-CM | POA: Diagnosis not present

## 2017-03-19 DIAGNOSIS — G4733 Obstructive sleep apnea (adult) (pediatric): Secondary | ICD-10-CM | POA: Diagnosis present

## 2017-03-19 DIAGNOSIS — I5031 Acute diastolic (congestive) heart failure: Secondary | ICD-10-CM | POA: Diagnosis not present

## 2017-03-19 DIAGNOSIS — J9601 Acute respiratory failure with hypoxia: Secondary | ICD-10-CM | POA: Diagnosis not present

## 2017-03-19 DIAGNOSIS — M1990 Unspecified osteoarthritis, unspecified site: Secondary | ICD-10-CM | POA: Diagnosis present

## 2017-03-19 DIAGNOSIS — R0600 Dyspnea, unspecified: Secondary | ICD-10-CM | POA: Diagnosis present

## 2017-03-19 DIAGNOSIS — I480 Paroxysmal atrial fibrillation: Secondary | ICD-10-CM | POA: Diagnosis present

## 2017-03-19 DIAGNOSIS — I5030 Unspecified diastolic (congestive) heart failure: Secondary | ICD-10-CM | POA: Diagnosis not present

## 2017-03-19 DIAGNOSIS — R000 Tachycardia, unspecified: Secondary | ICD-10-CM | POA: Diagnosis not present

## 2017-03-19 DIAGNOSIS — E119 Type 2 diabetes mellitus without complications: Secondary | ICD-10-CM | POA: Diagnosis present

## 2017-03-19 DIAGNOSIS — Z87891 Personal history of nicotine dependence: Secondary | ICD-10-CM | POA: Diagnosis not present

## 2017-03-19 DIAGNOSIS — R509 Fever, unspecified: Secondary | ICD-10-CM | POA: Insufficient documentation

## 2017-03-19 DIAGNOSIS — J9600 Acute respiratory failure, unspecified whether with hypoxia or hypercapnia: Secondary | ICD-10-CM | POA: Diagnosis not present

## 2017-03-19 DIAGNOSIS — Z885 Allergy status to narcotic agent status: Secondary | ICD-10-CM

## 2017-03-19 DIAGNOSIS — M7989 Other specified soft tissue disorders: Secondary | ICD-10-CM

## 2017-03-19 DIAGNOSIS — Z9181 History of falling: Secondary | ICD-10-CM | POA: Diagnosis not present

## 2017-03-19 HISTORY — DX: Other persistent atrial fibrillation: I48.19

## 2017-03-19 HISTORY — DX: Acute cystitis without hematuria: N30.00

## 2017-03-19 HISTORY — DX: Pneumonia, unspecified organism: J18.9

## 2017-03-19 HISTORY — DX: Acute diastolic (congestive) heart failure: I50.31

## 2017-03-19 HISTORY — DX: Fever, unspecified: R50.9

## 2017-03-19 HISTORY — DX: Other pneumonia, unspecified organism: J18.8

## 2017-03-19 HISTORY — DX: Dyspnea, unspecified: R06.00

## 2017-03-19 HISTORY — DX: Cerebral infarction, unspecified: I63.9

## 2017-03-19 LAB — CBC WITH DIFFERENTIAL/PLATELET
BASOS ABS: 0 10*3/uL (ref 0.0–0.1)
BASOS PCT: 0 %
EOS ABS: 0 10*3/uL (ref 0.0–0.7)
EOS PCT: 0 %
HCT: 45.8 % (ref 36.0–46.0)
Hemoglobin: 15.7 g/dL — ABNORMAL HIGH (ref 12.0–15.0)
LYMPHS PCT: 7 %
Lymphs Abs: 0.7 10*3/uL (ref 0.7–4.0)
MCH: 33.3 pg (ref 26.0–34.0)
MCHC: 34.3 g/dL (ref 30.0–36.0)
MCV: 97 fL (ref 78.0–100.0)
Monocytes Absolute: 1.1 10*3/uL — ABNORMAL HIGH (ref 0.1–1.0)
Monocytes Relative: 11 %
Neutro Abs: 8.6 10*3/uL — ABNORMAL HIGH (ref 1.7–7.7)
Neutrophils Relative %: 82 %
PLATELETS: 215 10*3/uL (ref 150–400)
RBC: 4.72 MIL/uL (ref 3.87–5.11)
RDW: 12.5 % (ref 11.5–15.5)
WBC: 10.4 10*3/uL (ref 4.0–10.5)

## 2017-03-19 LAB — GLUCOSE, CAPILLARY: GLUCOSE-CAPILLARY: 207 mg/dL — AB (ref 65–99)

## 2017-03-19 LAB — URINALYSIS, ROUTINE W REFLEX MICROSCOPIC
BILIRUBIN URINE: NEGATIVE
GLUCOSE, UA: 50 mg/dL — AB
KETONES UR: 20 mg/dL — AB
NITRITE: POSITIVE — AB
PH: 6 (ref 5.0–8.0)
Protein, ur: >=300 mg/dL — AB
SPECIFIC GRAVITY, URINE: 1.02 (ref 1.005–1.030)

## 2017-03-19 LAB — COMPREHENSIVE METABOLIC PANEL
ALK PHOS: 61 U/L (ref 38–126)
ALT: 17 U/L (ref 14–54)
AST: 28 U/L (ref 15–41)
Albumin: 4.2 g/dL (ref 3.5–5.0)
Anion gap: 12 (ref 5–15)
BILIRUBIN TOTAL: 1 mg/dL (ref 0.3–1.2)
BUN: 9 mg/dL (ref 6–20)
CALCIUM: 9.4 mg/dL (ref 8.9–10.3)
CO2: 29 mmol/L (ref 22–32)
Chloride: 90 mmol/L — ABNORMAL LOW (ref 101–111)
Creatinine, Ser: 0.68 mg/dL (ref 0.44–1.00)
GFR calc Af Amer: >60 mL/min (ref >60)
Glucose, Bld: 185 mg/dL — ABNORMAL HIGH (ref 65–99)
POTASSIUM: 3.4 mmol/L — AB (ref 3.5–5.1)
Sodium: 131 mmol/L — ABNORMAL LOW (ref 135–145)
TOTAL PROTEIN: 7.8 g/dL (ref 6.5–8.1)

## 2017-03-19 LAB — I-STAT CG4 LACTIC ACID, ED
LACTIC ACID, VENOUS: 1.27 mmol/L (ref 0.5–1.9)
Lactic Acid, Venous: 1.94 mmol/L — ABNORMAL HIGH (ref 0.5–1.9)

## 2017-03-19 LAB — D-DIMER, QUANTITATIVE (NOT AT ARMC): D DIMER QUANT: 0.77 ug{FEU}/mL — AB (ref 0.00–0.50)

## 2017-03-19 LAB — BRAIN NATRIURETIC PEPTIDE: B NATRIURETIC PEPTIDE 5: 241.1 pg/mL — AB (ref 0.0–100.0)

## 2017-03-19 LAB — TROPONIN I: TROPONIN I: 0.03 ng/mL — AB (ref <0.03)

## 2017-03-19 LAB — PROCALCITONIN: PROCALCITONIN: 0.22 ng/mL

## 2017-03-19 MED ORDER — DEXTROSE 5 % IV SOLN
1.0000 g | INTRAVENOUS | Status: DC
  Administered 2017-03-20 – 2017-03-25 (×6): 1 g via INTRAVENOUS
  Filled 2017-03-19 (×7): qty 10

## 2017-03-19 MED ORDER — SODIUM CHLORIDE 0.9 % IV BOLUS (SEPSIS)
1000.0000 mL | Freq: Once | INTRAVENOUS | Status: AC
  Administered 2017-03-19: 1000 mL via INTRAVENOUS

## 2017-03-19 MED ORDER — DEXTROSE 5 % IV SOLN
500.0000 mg | INTRAVENOUS | Status: AC
  Administered 2017-03-20 – 2017-03-24 (×5): 500 mg via INTRAVENOUS
  Filled 2017-03-19 (×5): qty 500

## 2017-03-19 MED ORDER — DEXTROSE 5 % IV SOLN
1.0000 g | Freq: Once | INTRAVENOUS | Status: AC
  Administered 2017-03-19: 1 g via INTRAVENOUS
  Filled 2017-03-19: qty 10

## 2017-03-19 MED ORDER — ALBUTEROL SULFATE (2.5 MG/3ML) 0.083% IN NEBU
5.0000 mg | INHALATION_SOLUTION | Freq: Once | RESPIRATORY_TRACT | Status: AC
Start: 2017-03-19 — End: 2017-03-19
  Administered 2017-03-19: 5 mg via RESPIRATORY_TRACT
  Filled 2017-03-19: qty 6

## 2017-03-19 MED ORDER — ACETAMINOPHEN 325 MG PO TABS
650.0000 mg | ORAL_TABLET | Freq: Four times a day (QID) | ORAL | Status: DC | PRN
  Administered 2017-03-19 – 2017-03-26 (×8): 650 mg via ORAL
  Filled 2017-03-19 (×9): qty 2

## 2017-03-19 MED ORDER — DEXTROSE 5 % IV SOLN
500.0000 mg | Freq: Once | INTRAVENOUS | Status: AC
  Administered 2017-03-19: 500 mg via INTRAVENOUS
  Filled 2017-03-19: qty 500

## 2017-03-19 MED ORDER — PROMETHAZINE HCL 25 MG PO TABS: 12.5000 mg | ORAL_TABLET | Freq: Four times a day (QID) | ORAL | Status: DC | PRN

## 2017-03-19 MED ORDER — ACETAMINOPHEN 325 MG PO TABS
650.0000 mg | ORAL_TABLET | Freq: Once | ORAL | Status: AC
  Administered 2017-03-19: 650 mg via ORAL
  Filled 2017-03-19: qty 2

## 2017-03-19 MED ORDER — IOPAMIDOL (ISOVUE-370) INJECTION 76%
INTRAVENOUS | Status: AC
  Administered 2017-03-19: 100 mL
  Filled 2017-03-19: qty 100

## 2017-03-19 MED ORDER — AMLODIPINE BESYLATE 10 MG PO TABS
10.0000 mg | ORAL_TABLET | Freq: Every day | ORAL | Status: DC
Start: 2017-03-19 — End: 2017-03-22
  Administered 2017-03-19: 10 mg via ORAL
  Filled 2017-03-19 (×2): qty 1

## 2017-03-19 MED ORDER — LOSARTAN POTASSIUM-HCTZ 100-12.5 MG PO TABS: 1.0000 | ORAL_TABLET | Freq: Every day | ORAL | Status: DC

## 2017-03-19 MED ORDER — INSULIN ASPART 100 UNIT/ML ~~LOC~~ SOLN
0.0000 [IU] | Freq: Three times a day (TID) | SUBCUTANEOUS | Status: DC
  Administered 2017-03-20 – 2017-03-22 (×6): 2 [IU] via SUBCUTANEOUS
  Administered 2017-03-22: 3 [IU] via SUBCUTANEOUS
  Administered 2017-03-23 (×2): 2 [IU] via SUBCUTANEOUS
  Administered 2017-03-24: 1 [IU] via SUBCUTANEOUS
  Administered 2017-03-24: 3 [IU] via SUBCUTANEOUS
  Administered 2017-03-24 – 2017-03-25 (×2): 2 [IU] via SUBCUTANEOUS
  Administered 2017-03-25: 1 [IU] via SUBCUTANEOUS
  Administered 2017-03-25 – 2017-03-27 (×3): 2 [IU] via SUBCUTANEOUS
  Administered 2017-03-27 – 2017-03-28 (×2): 1 [IU] via SUBCUTANEOUS
  Administered 2017-03-28: 2 [IU] via SUBCUTANEOUS

## 2017-03-19 MED ORDER — LOSARTAN POTASSIUM 50 MG PO TABS
100.0000 mg | ORAL_TABLET | Freq: Every day | ORAL | Status: DC
  Filled 2017-03-19 (×2): qty 2

## 2017-03-19 MED ORDER — TRAZODONE HCL 50 MG PO TABS: 50.0000 mg | ORAL_TABLET | Freq: Every evening | ORAL | Status: DC | PRN

## 2017-03-19 MED ORDER — MAGNESIUM OXIDE 400 (241.3 MG) MG PO TABS
400.0000 mg | ORAL_TABLET | Freq: Every day | ORAL | Status: DC
  Administered 2017-03-20 – 2017-03-28 (×9): 400 mg via ORAL
  Filled 2017-03-19 (×9): qty 1

## 2017-03-19 MED ORDER — ENOXAPARIN SODIUM 40 MG/0.4ML ~~LOC~~ SOLN
40.0000 mg | SUBCUTANEOUS | Status: DC
  Administered 2017-03-19: 40 mg via SUBCUTANEOUS
  Filled 2017-03-19: qty 0.4

## 2017-03-19 MED ORDER — ACETAMINOPHEN 650 MG RE SUPP
650.0000 mg | Freq: Four times a day (QID) | RECTAL | Status: DC | PRN
  Filled 2017-03-19: qty 1

## 2017-03-19 MED ORDER — HYDROCHLOROTHIAZIDE 12.5 MG PO CAPS
12.5000 mg | ORAL_CAPSULE | Freq: Every day | ORAL | Status: DC
  Filled 2017-03-19 (×2): qty 1

## 2017-03-19 MED ORDER — CELECOXIB 200 MG PO CAPS
200.0000 mg | ORAL_CAPSULE | Freq: Every day | ORAL | Status: DC
  Administered 2017-03-20 – 2017-03-28 (×9): 200 mg via ORAL
  Filled 2017-03-19 (×10): qty 1

## 2017-03-19 MED ORDER — PANTOPRAZOLE SODIUM 40 MG PO TBEC
40.0000 mg | DELAYED_RELEASE_TABLET | Freq: Every day | ORAL | Status: DC
  Administered 2017-03-20 – 2017-03-28 (×9): 40 mg via ORAL
  Filled 2017-03-19 (×9): qty 1

## 2017-03-19 MED ORDER — GABAPENTIN 100 MG PO CAPS
100.0000 mg | ORAL_CAPSULE | Freq: Three times a day (TID) | ORAL | Status: DC
  Administered 2017-03-19 – 2017-03-28 (×26): 100 mg via ORAL
  Filled 2017-03-19 (×26): qty 1

## 2017-03-19 MED ORDER — SODIUM CHLORIDE 0.9 % IV BOLUS (SEPSIS)
500.0000 mL | Freq: Once | INTRAVENOUS | Status: AC
  Administered 2017-03-19: 500 mL via INTRAVENOUS

## 2017-03-19 MED ORDER — ATORVASTATIN CALCIUM 20 MG PO TABS
20.0000 mg | ORAL_TABLET | Freq: Every day | ORAL | Status: DC
  Administered 2017-03-20 – 2017-03-28 (×9): 20 mg via ORAL
  Filled 2017-03-19 (×9): qty 1

## 2017-03-19 MED ORDER — IPRATROPIUM BROMIDE 0.02 % IN SOLN
0.5000 mg | Freq: Once | RESPIRATORY_TRACT | Status: AC
  Administered 2017-03-19: 0.5 mg via RESPIRATORY_TRACT
  Filled 2017-03-19: qty 2.5

## 2017-03-19 NOTE — ED Notes (Signed)
Pt cleaned and an external female catheter being used.

## 2017-03-19 NOTE — Progress Notes (Signed)
Received report on pt.

## 2017-03-19 NOTE — Progress Notes (Signed)
I saw and examined Ms. Karen Robinsons.  Discussed with residents.  I will cosign their H&PE when available.  Agree with the management.  Briefly, I know [name] well in that I am her PCP.  Has three day hx of cough, congestion and feeling bad.  No sick contacts.  Patient is SP CVA but this has not cause aspiration problems.  No skin source.  Patient has had urinary frequency, but not dysuria or CVA tenderness. Temp is elevated along with lactic acid.  Meets sepsis criteria. We will treat with antibiotics to cover both respiratory source (unimpressive CXR by my read) and urinary source (UA suspicious for infection.)   We will also consider non infectious causes such as CHF (no hx) and PE.  Note: not on anticoag or antiplatelet therapy because old CVA was hemorrhagic.

## 2017-03-19 NOTE — Progress Notes (Signed)
   [facility] Kerrin Mo, MD Phone: [phone]  Reason For Visit: SDA URI   # Patient with a history of type 2 diabetes and hypertension presenting to clinic with URI symptoms and left lower extremity swelling. She states that on Friday she started having cough and congestion.  No fevers. Some sputum production. No hx of asthma or COPD. On Saturday, she developed left lower extremity swelling. No hx of trauma. She states that this is painful to the touch. She endorses worsening dyspnea, she endorses wheezing. Denies chest pain but endorses chest congestion. No history of asthma or COPD. Patient does not smoke. No history of heart failure.  Past Medical History Reviewed problem list.  Medications- reviewed and updated No additions to family history Social history- patient is a non-smoker  Objective: BP 125/80 (BP Location: Left Arm, Patient Position: Sitting, Cuff Size: Normal)   Pulse (!) 114   Temp 99.5 F (37.5 C) (Oral)  Gen: NAD, alert, cooperative with exam HEENT: Normal    Neck: No masses palpated. No lymphadenopathy    Throat: moist mucus membranes, no erythema Cardio: regular rate and rhythm, S1S2 heard, no murmurs appreciated Pulm: wheezing throughtout lung fields, slight decreased breath sounds in the right lower lung field, clavicular retractions, on room air sats of 87%> 92% on 2 L of oxygen  Extremities: Slight warm left leg, with calf tenderness, 1+ pitting edema, and dorsiflexion; right leg wnl    Assessment/Plan: See problem based a/p  Dyspnea Concern for PE given the unilateral leg swelling and dyspnea. Patient with a new oxygen requirement satting 87% in the clinic and 92% on 2 L of oxygen. Patient with wheezing throughout lung fields and URI type symptoms would likely cover patient with antibiotics given no history of COPD or asthma and no history of smoking -unlikely patient is having an exacerbation. Patient should be treated for  possible pneumonia. -Called EMS and discussed patient with ED charge nurse -Expect patient to be admitted for continued observation and workup  Pain and swelling of left lower extremity Concern for DVT, patient with painful left lower extremity no significant history of trauma and unilateral swelling -Sent to the ED via EMS for evaluation

## 2017-03-19 NOTE — ED Provider Notes (Signed)
[facility] DEPT Provider Note   CSN: [id number] Arrival date & time: 03/19/17  1223     History   Chief Complaint Chief Complaint  Patient presents with  . Shortness of Breath    HPI Karen Jackson is a 76 y.o. female.  Patient with history of stroke with left-sided deficits presents with complaint of shortness of breath, wheezing, fevers starting yesterday. Patient has had increased cough. No URI symptoms, chest pain, vomiting, abdominal pain, diarrhea, urinary symptoms. No skin rashes. Patient is having difficulty walking yesterday due to pain in her left ankle and knee. No redness of the skin in this area. She did not injure herself. The onset of this condition was acute. The course is constant. Aggravating factors: none. Alleviating factors: none. No immunocompromise. No history of diabetes.        Past Medical History:  Diagnosis Date  . Arthritis   . Hyperlipidemia   . Hypertension   . Osteopenia     Patient Active Problem List   Diagnosis Date Noted  . Dyspnea 03/19/2017  . Pain and swelling of left lower extremity 03/19/2017  . Metabolic syndrome 40/98/1191  . Encounter for preventive health examination 02/17/2016  . Morbid obesity ([place]) 06/17/2015  . Fall at home 12/01/2014  . Hypomagnesemia 04/24/2014  . Hemiparesis affecting left side as late effect of cerebrovascular accident ([place]) 04/24/2014  . Nontraumatic cerebral hemorrhage ([place]) 04/30/2012  . Diabetes type 2, controlled ([place]) 03/04/2010  . OSTEOPENIA 01/21/2009  . UNSPECIFIED VITAMIN D DEFICIENCY 11/19/2007  . ESSENTIAL HYPERTENSION, BENIGN 11/19/2007  . HYPERCHOLESTEROLEMIA 10/25/2006  . GASTROESOPHAGEAL REFLUX, NO ESOPHAGITIS 10/25/2006  . DIVERTICULOSIS OF COLON 10/25/2006  . Osteoarthritis 10/25/2006  . CERVICAL SPINE DISORDER, NOS 10/25/2006    Past Surgical History:  Procedure Laterality Date  . ANKLE SURGERY    . APPENDECTOMY    . CHOLECYSTECTOMY    . HERNIA REPAIR     Esophagus    . JOINT REPLACEMENT     total- right partial- left  . MASTECTOMY PARTIAL / LUMPECTOMY  2012   left    OB History    No data available       Home Medications    Prior to Admission medications   Medication Sig Start Date End Date Taking? Authorizing Provider  amLODipine (NORVASC) 10 MG tablet TAKE 1 TABLET BY MOUTH DAILY 12/14/16   Zenia Resides, MD  atorvastatin (LIPITOR) 20 MG tablet TAKE 1 TABLET BY MOUTH EVERY DAY 02/01/17   Zenia Resides, MD  Calcium Carb-Cholecalciferol (CALCIUM CARBONATE-VITAMIN D3) 600-400 MG-UNIT TABS Take 1 tablet by mouth daily. 04/27/14   Zenia Resides, MD  celecoxib (CELEBREX) 200 MG capsule TAKE 1 CAPSULE BY MOUTH EVERY DAY WITH FOOD 07/24/16   Zenia Resides, MD  diclofenac sodium (VOLTAREN) 1 % GEL APPLY 2 GRAMS EXTERNALLY TO THE AFFECTED AREA FOUR TIMES DAILY 06/29/16   Zenia Resides, MD  gabapentin (NEURONTIN) 100 MG capsule TAKE 1 CAPSULE BY MOUTH THREE TIMES DAILY 09/15/16   Zenia Resides, MD  losartan-hydrochlorothiazide (HYZAAR) 100-12.5 MG tablet Take 1 tablet by mouth daily. 02/16/17   Zenia Resides, MD  MAGNESIUM-OXIDE 400 (241.3 Mg) MG tablet TAKE 1 TABLET BY MOUTH DAILY 09/28/16   Zenia Resides, MD  omeprazole (PRILOSEC) 20 MG capsule TAKE 1 CAPSULE BY MOUTH EVERY DAY 08/03/16   Zenia Resides, MD  traZODone (DESYREL) 50 MG tablet TAKE 1/2 TO 1 TABLET BY MOUTH AT BEDTIME AS NEEDED FOR SLEEP 11/30/16  Zenia Resides, MD    Family History Family History  Problem Relation Age of Onset  . Diabetes Mother     Social History Social History  Substance Use Topics  . Smoking status: Former Smoker    Types: Cigarettes    Quit date: 03/09/1981  . Smokeless tobacco: Never Used  . Alcohol use 7.0 oz/week    14 drink(s) per week     Allergies   Codeine phosphate and Simvastatin   Review of Systems Review of Systems  Constitutional: Positive for fatigue and fever.  HENT: Negative for rhinorrhea and sore throat.    Eyes: Negative for redness.  Respiratory: Positive for cough, shortness of breath and wheezing.   Cardiovascular: Negative for chest pain.  Gastrointestinal: Negative for abdominal pain, diarrhea, nausea and vomiting.  Genitourinary: Negative for dysuria.  Musculoskeletal: Positive for arthralgias and gait problem. Negative for myalgias.  Skin: Negative for rash.  Neurological: Negative for headaches.     Physical Exam Updated Vital Signs BP 135/64 (BP Location: Right Arm)   Pulse (!) 114   Temp (!) 103 F (39.4 C)   Resp (!) 26   SpO2 90%   Physical Exam  Constitutional: She appears well-developed and well-nourished.  HENT:  Head: Normocephalic and atraumatic.  Mouth/Throat: Oropharynx is clear and moist.  Eyes: Conjunctivae are normal. Right eye exhibits no discharge. Left eye exhibits no discharge.  Neck: Normal range of motion. Neck supple.  Cardiovascular: Regular rhythm and normal heart sounds.  Tachycardia present.   Pulmonary/Chest: Effort normal. No respiratory distress. She has wheezes (Scattered, expiratory). She has no rales.  Abdominal: Soft. There is no tenderness. There is no rebound and no guarding.  Musculoskeletal: Normal range of motion.  Patient with pain with movement of her left knee and ankle. No overlying erythema or swelling. No signs of joint infection. No effusions.  Neurological: She is alert.  Skin: Skin is warm and dry.  Psychiatric: She has a normal mood and affect.  Nursing note and vitals reviewed.    ED Treatments / Results  Labs (all labs ordered are listed, but only abnormal results are displayed) Labs Reviewed  COMPREHENSIVE METABOLIC PANEL - Abnormal; Notable for the following:       Result Value   Sodium 131 (*)    Potassium 3.4 (*)    Chloride 90 (*)    Glucose, Bld 185 (*)    All other components within normal limits  CBC WITH DIFFERENTIAL/PLATELET - Abnormal; Notable for the following:    Hemoglobin 15.7 (*)    Neutro  Abs 8.6 (*)    Monocytes Absolute 1.1 (*)    All other components within normal limits  URINALYSIS, ROUTINE W REFLEX MICROSCOPIC - Abnormal; Notable for the following:    APPearance HAZY (*)    Glucose, UA 50 (*)    Hgb urine dipstick SMALL (*)    Ketones, ur 20 (*)    Protein, ur >=300 (*)    Nitrite POSITIVE (*)    Leukocytes, UA MODERATE (*)    Bacteria, UA MANY (*)    Squamous Epithelial / LPF 0-5 (*)    All other components within normal limits  I-STAT CG4 LACTIC ACID, ED - Abnormal; Notable for the following:    Lactic Acid, Venous 1.94 (*)    All other components within normal limits  CULTURE, BLOOD (ROUTINE X 2)  CULTURE, BLOOD (ROUTINE X 2)  URINE CULTURE  D-DIMER, QUANTITATIVE (NOT AT [facility])  I-STAT CG4 LACTIC ACID,  ED    EKG  EKG Interpretation  Date/Time:  Monday March 19 2017 12:37:01 EDT Ventricular Rate:  109 PR Interval:    QRS Duration: 84 QT Interval:  318 QTC Calculation: 429 R Axis:   -30 Text Interpretation:  Sinus tachycardia Prolonged PR interval Left axis deviation Anteroseptal infarct, old wandering baseline Non-specific ST-t changes Confirmed by Pattricia Boss [phone]) on 03/19/2017 2:33:09 PM       Radiology Dg Chest Portable 1 View  Result Date: 03/19/2017 CLINICAL DATA:  Shortness of breath EXAM: PORTABLE CHEST 1 VIEW COMPARISON:  07/13/2010 FINDINGS: Cardiac shadow is stable. Patient is somewhat rotated to the right accentuating the mediastinum markings. Lungs are well aerated bilaterally. Minimal right basilar atelectasis is seen. Old rib fractures are noted on the left and stable. IMPRESSION: Minimal right basilar atelectasis.  No acute abnormality noted. Electronically Signed   By: Inez Catalina M.D.   On: 03/19/2017 13:34    Procedures Procedures (including critical care time)  Medications Ordered in ED Medications  cefTRIAXone (ROCEPHIN) 1 g in dextrose 5 % 50 mL IVPB (not administered)  azithromycin (ZITHROMAX) 500 mg in dextrose 5 %  250 mL IVPB (not administered)  sodium chloride 0.9 % bolus 1,000 mL (0 mLs Intravenous Stopped 03/19/17 1509)    And  sodium chloride 0.9 % bolus 1,000 mL (1,000 mLs Intravenous New Bag/Given 03/19/17 1414)    And  sodium chloride 0.9 % bolus 500 mL (0 mLs Intravenous Stopped 03/19/17 1509)  cefTRIAXone (ROCEPHIN) 1 g in dextrose 5 % 50 mL IVPB (0 g Intravenous Stopped 03/19/17 1509)  azithromycin (ZITHROMAX) 500 mg in dextrose 5 % 250 mL IVPB (0 mg Intravenous Stopped 03/19/17 1509)  acetaminophen (TYLENOL) tablet 650 mg (650 mg Oral Given 03/19/17 1302)  albuterol (PROVENTIL) (2.5 MG/3ML) 0.083% nebulizer solution 5 mg (5 mg Nebulization Given 03/19/17 1413)  ipratropium (ATROVENT) nebulizer solution 0.5 mg (0.5 mg Nebulization Given 03/19/17 1413)     Initial Impression / Assessment and Plan / ED Course  I have reviewed the triage vital signs and the nursing notes.  Pertinent labs & imaging results that were available during my care of the patient were reviewed by me and considered in my medical decision making (see chart for details).     Patient seen and examined. Sepsis orders placed, code sepsis initiated. Suspect PNA. Abx ordered.   Vital signs reviewed and are as follows: BP 135/64 (BP Location: Right Arm)   Pulse (!) 114   Temp (!) 103 F (39.4 C)   Resp (!) 26   SpO2 90%   Clinical PNA, also with UTI on UA. LE doppler pending.   Spoke with Family Practice who will see and admit. Requests d-dimer -- ordered.   Pt updated and agrees with plan. She gets very short of breath with activity.   Final Clinical Impressions(s) / ED Diagnoses   Final diagnoses:  Fever, unspecified fever cause  Shortness of breath  Acute cystitis without hematuria  Pain of left lower extremity   Admit.   New Prescriptions New Prescriptions   No medications on file     Carlisle Cater, Hershal Coria 03/19/17 1555    Pattricia Boss, MD 03/24/17 [phone]

## 2017-03-19 NOTE — Assessment & Plan Note (Signed)
Concern for DVT, patient with painful left lower extremity no significant history of trauma and unilateral swelling -Sent to the ED via EMS for evaluation

## 2017-03-19 NOTE — ED Notes (Signed)
Lactic acid results given to PA

## 2017-03-19 NOTE — ED Triage Notes (Signed)
Per EMS: pt from PCP office with new onset SOB and low O2 sats; pt labored at present with wheezing noted

## 2017-03-19 NOTE — Patient Instructions (Signed)
Sent to the ED for evaluation

## 2017-03-19 NOTE — H&P (Signed)
[facility] Admission History and Physical Service Pager: [phone]  Patient name: Karen Jackson Medical record number: [id number] Date of birth: 1941-06-07 Age: 76 y.o. Gender: female  Primary Care Provider: Zenia Resides, MD Consultants: none Code Status: FULL   Chief Complaint: Cough and SOB  Assessment and Plan:  Karen Jackson is a 76 y.o. female presenting with cough and SOB of five days' duration. PMH is significant for hemorrhagic stroke in 2013 with residual left sided weakness, HTN, HLD, Type 2 DM, hypomagnesemia, urinary frequency, GERD, and OA.  SOB:  On arrival meeting sepsis criteria with fever to 103F, tachycardia and tachypnea.  DDx includes PNA and PE.  Patient reports SOB with cough and congestion since 7/20.  She also noticed some left leg pain and swelling that prevented her from bearing weight.  She presented to clinic today and desatted to 87%, which increased to 92% when given 2L O2.  She is not on O2 at home and this is a new requirement for her.  She was transferred to the ED and desatted again there, so she was put on 4L O2 with SpO2 in the mid-90's.  WBC 10.4, LA 1.27 and febrile to 103F in the ED.  Given 1L NS bolus x 5, albuterol, and ipratropium in the ED. UA hazy with many bacteria, moderate leuks and positive for nitrite.  She denied dysuria however endorsing increased frequency of urination over last 2 weeks.  She was started on CTX and azithromycin in the ED.  Treating for possible pneumonia and UTI given fever however CXR negative for infiltrate.  In clinic also endorsing left leg calf tenderness and pain with dorsiflexion, concerning for DVT.  LE venous dopplers in ED negative for DVT and initial troponin also negative.  -admit to telemetry under attending Dr. Andria Frames   -continuous cardiac monitoring  -continue azithromycin and ceftriaxone -trend troponins x3 -BNP -f/u D-dimer -CTA to rule out PE  -check procalcitonin  -AM EKG   -AM CBC, BMET -supplemental O2 > 4L per [place], will wean as tolerated  -continuous pulse ox  -Tylenol PRN  -continuous pulse ox -vitals per unit routine  Hx of hemorrhagic stroke:  Left arm and leg are largely immobile, and patient reports increased swelling in the left leg and foot.  She is not currently on anticoagulation due to hemorrhagic stroke.  -continue BP medications -continue gabapentin 100 mg TID  Urinary frequency 2/2 UTI: Has been going to the bathroom hourly during the night over the past two weeks.  She says this is worse than her usual nighttime urinary frequency.  Denies dysuria.  UA positive for bacteria, nitrite, leukocyte esterase.   -continue ceftriaxone  -follow urine cx  -monitor UOP  HTN:  BP 124/61 on admission.  Takes amlodipine 10 mg daily and losartan-HCTZ 100-12.5 mg daily -continue home medications -monitor BPs   HLD:  Takes Lipitor 20 mg daily -continue home Lipitor  Type 2 DM:  Last A1C in April was 6.6.  Does not take medications for diabetes at home. -Recheck A1c  -sensitive SSI  GERD:  Takes Prilosec 20 mg at home -Protonix 40 mg daily  Hypomagnesemia.  At home on 400 mg po daily -Continue home Mag   OA:  Stable. Takes Celebrex 200 mg at home -continue home Celebrex  FEN/GI: carb modified, protonix Prophylaxis: Lovenox  Disposition: Admit to telemetry, attending Dr. Andria Frames   History of Present Illness:  Karen Jackson is a 76 y.o. female presenting  with cough and SOB that started around 7/19 last week.  She says that her cough is sometimes productive of clear sputum, and she feels congested.  On 7/21, she lost her appetite and felt nauseated while eating.  She has had post-tussive emesis multiple times and vomited when she drank her coffee on 7/22.  Her vomit always appeared normal, no blood noted.  She has not eaten since 7/21.  She also notes that her left leg has been swelling more than usual, which started on 7/20.  She has been sleeping on  an inclined bed in the last week due to her SOB.  She denies subjective fevers or chills during this time, also denies chest pain.  Also denies sick contacts or long trips or immobilization.  The patient says that she was told that she had a fever in clinic, although her reported temperature was 99.58F.  At clinic, she desatted into the 80's on room air, was given 2L O2, and improved to 92%.  She was sent to the ED from clinic due to concern for PE because of her sudden onset shortness of breath and leg swelling.  In the ED, her temperature was found to be 103F, and she met sepsis criteria, so she was worked up for PNA and UTI.  She was also worked up for PE with venous dopplers and D-dimer.  Review Of Systems: Per HPI with the following additions:   Review of Systems  Constitutional: Negative for chills and weight loss.  HENT: Positive for congestion.   Respiratory: Positive for cough, sputum production and shortness of breath.   Cardiovascular: Positive for orthopnea. Negative for chest pain.  Gastrointestinal: Positive for nausea and vomiting. Negative for constipation and diarrhea.  Genitourinary: Positive for frequency. Negative for dysuria and urgency.   Patient Active Problem List   Diagnosis Date Noted  . Dyspnea 03/19/2017  . Pain and swelling of left lower extremity 03/19/2017  . Shortness of breath 03/19/2017  . Acute cystitis without hematuria   . Fever   . Sepsis ([place])   . Metabolic syndrome 46/56/8127  . Encounter for preventive health examination 02/17/2016  . Morbid obesity ([place]) 06/17/2015  . Fall at home 12/01/2014  . Hypomagnesemia 04/24/2014  . Hemiparesis affecting left side as late effect of cerebrovascular accident ([place]) 04/24/2014  . Nontraumatic cerebral hemorrhage ([place]) 04/30/2012  . Diabetes type 2, controlled ([place]) 03/04/2010  . OSTEOPENIA 01/21/2009  . UNSPECIFIED VITAMIN D DEFICIENCY 11/19/2007  . ESSENTIAL HYPERTENSION, BENIGN 11/19/2007  .  HYPERCHOLESTEROLEMIA 10/25/2006  . GASTROESOPHAGEAL REFLUX, NO ESOPHAGITIS 10/25/2006  . DIVERTICULOSIS OF COLON 10/25/2006  . Osteoarthritis 10/25/2006  . CERVICAL SPINE DISORDER, NOS 10/25/2006   Past Medical History: Past Medical History:  Diagnosis Date  . Arthritis   . Dyspnea   . Fever of unknown origin 03/19/2017  . Hyperlipidemia   . Hypertension   . Osteopenia   . Stroke [facility]) 2013   Past Surgical History: Past Surgical History:  Procedure Laterality Date  . ANKLE SURGERY    . APPENDECTOMY    . CHOLECYSTECTOMY    . HERNIA REPAIR     Esophagus  . JOINT REPLACEMENT     total- right partial- left  . MASTECTOMY PARTIAL / LUMPECTOMY  2012   left   Social History: Social History  Substance Use Topics  . Smoking status: Former Smoker    Types: Cigarettes    Quit date: 03/09/1981  . Smokeless tobacco: Never Used  . Alcohol use 7.0 oz/week  14 Standard drinks or equivalent per week   Additional social history: Lives at home alone, has a home aide who comes by daily. Tobacco use - quit 40 years ago about 1/2 PPD.  Occasionally drinks wine.  No drug use.   Please also refer to relevant sections of EMR.  Family History: Family History  Problem Relation Age of Onset  . Diabetes Mother    Allergies and Medications: Allergies  Allergen Reactions  . Codeine Phosphate     REACTION: unspecified  . Simvastatin     REACTION: myalgias   No current facility-administered medications on file prior to encounter.    Current Outpatient Prescriptions on File Prior to Encounter  Medication Sig Dispense Refill  . amLODipine (NORVASC) 10 MG tablet TAKE 1 TABLET BY MOUTH DAILY 90 tablet 3  . atorvastatin (LIPITOR) 20 MG tablet TAKE 1 TABLET BY MOUTH EVERY DAY 90 tablet 3  . Calcium Carb-Cholecalciferol (CALCIUM CARBONATE-VITAMIN D3) 600-400 MG-UNIT TABS Take 1 tablet by mouth daily.    . celecoxib (CELEBREX) 200 MG capsule TAKE 1 CAPSULE BY MOUTH EVERY DAY WITH FOOD 90  capsule 3  . diclofenac sodium (VOLTAREN) 1 % GEL APPLY 2 GRAMS EXTERNALLY TO THE AFFECTED AREA FOUR TIMES DAILY 100 g 6  . gabapentin (NEURONTIN) 100 MG capsule TAKE 1 CAPSULE BY MOUTH THREE TIMES DAILY 270 capsule 3  . losartan-hydrochlorothiazide (HYZAAR) 100-12.5 MG tablet Take 1 tablet by mouth daily. 90 tablet 3  . MAGNESIUM-OXIDE 400 (241.3 Mg) MG tablet TAKE 1 TABLET BY MOUTH DAILY 90 tablet 3  . omeprazole (PRILOSEC) 20 MG capsule TAKE 1 CAPSULE BY MOUTH EVERY DAY 90 capsule 3  . traZODone (DESYREL) 50 MG tablet TAKE 1/2 TO 1 TABLET BY MOUTH AT BEDTIME AS NEEDED FOR SLEEP 90 tablet 3   Objective: BP (!) 149/115 (BP Location: Right Arm)   Pulse (!) 108   Temp (!) 100.9 F (38.3 C) (Oral)   Resp 18   SpO2 92%    Exam: General: 76 yo diaphoretic appearing lady, talking comfortably, inclined in bed Eyes: PERRL, EOMI ENTM: no lymphadenopathy, MMM, o/p clear  Neck: supple, full ROM Cardiovascular: RRR, no MRG Respiratory: end-expiratory wheeze, no crackles Gastrointestinal: +bowel sounds, nontender to palpation MSK: normal range of motion in right upper and lower extremities, weakness of left extremities Derm: no rashes or ecchymoses Neuro: contractures present in left hand, CN II-XII grossly normal Psych: appropriate mood and affect  Labs and Imaging: CBC BMET   Recent Labs Lab 03/19/17 1244  WBC 10.4  HGB 15.7*  HCT 45.8  PLT 215    Recent Labs Lab 03/19/17 1244  NA 131*  K 3.4*  CL 90*  CO2 29  BUN 9  CREATININE 0.68  GLUCOSE 185*  CALCIUM 9.4     Dg Chest Portable 1 View  Result Date: 03/19/2017 CLINICAL DATA:  Shortness of breath EXAM: PORTABLE CHEST 1 VIEW COMPARISON:  07/13/2010 FINDINGS: Cardiac shadow is stable. Patient is somewhat rotated to the right accentuating the mediastinum markings. Lungs are well aerated bilaterally. Minimal right basilar atelectasis is seen. Old rib fractures are noted on the left and stable. IMPRESSION: Minimal right  basilar atelectasis.  No acute abnormality noted. Electronically Signed   By: Inez Catalina M.D.   On: 03/19/2017 13:34   Maia Breslow, PGY-1 03/19/2017  I have seen and evaluated the above patient with Dr. Shan Levans and agree with the documentation.  My editions are in blue.    Lovenia Kim, MD 03/19/2017,  6:40 PM PGY-2

## 2017-03-19 NOTE — Assessment & Plan Note (Signed)
Concern for PE given the unilateral leg swelling and dyspnea. Patient with a new oxygen requirement satting 87% in the clinic and 92% on 2 L of oxygen. Patient with wheezing throughout lung fields and URI type symptoms would likely cover patient with antibiotics given no history of COPD or asthma and no history of smoking -unlikely patient is having an exacerbation. Patient should be treated for possible pneumonia. -Called EMS and discussed patient with ED charge nurse -Expect patient to be admitted for continued observation and workup

## 2017-03-19 NOTE — Progress Notes (Signed)
Karen Jackson is a 76 y.o. female patient admitted from ED awake, alert - oriented  X 4 - no acute distress noted.  VSS - Blood pressure 124/61, pulse 92, temperature (!) 100.6 F (38.1 C), temperature source Oral, resp. rate 19, SpO2 94 %.    IV in place, occlusive dsg intact without redness.  Orientation to room, and floor completed with information packet given to patient/family.  Patient declined safety video at this time.  Admission INP armband ID verified with patient/family, and in place.   SR up x 2, fall assessment complete, with patient and family able to verbalize understanding of risk associated with falls, and verbalized understanding to call nsg before up out of bed.  Call light within reach, patient able to voice, and demonstrate understanding.  Pt has MASD on the buttocks and groin area. Skin is otherwise clean, dry, and intact. No evidence of skin break down noted on exam.     Will cont to eval and treat per MD orders.  Celine Ahr, RN 03/19/2017 5:24 PM

## 2017-03-19 NOTE — Progress Notes (Signed)
VASCULAR LAB PRELIMINARY  PRELIMINARY  PRELIMINARY  PRELIMINARY  Left lower extremity venous duplex completed.    Preliminary report: Left:  No evidence of DVT, superficial thrombosis, or Baker's cyst.  , , RVS 03/19/2017, 4:11 PM

## 2017-03-20 ENCOUNTER — Inpatient Hospital Stay (HOSPITAL_COMMUNITY): Payer: Medicare Other

## 2017-03-20 DIAGNOSIS — R0602 Shortness of breath: Secondary | ICD-10-CM

## 2017-03-20 DIAGNOSIS — I4891 Unspecified atrial fibrillation: Secondary | ICD-10-CM

## 2017-03-20 DIAGNOSIS — Z8673 Personal history of transient ischemic attack (TIA), and cerebral infarction without residual deficits: Secondary | ICD-10-CM

## 2017-03-20 DIAGNOSIS — R0603 Acute respiratory distress: Secondary | ICD-10-CM

## 2017-03-20 DIAGNOSIS — I480 Paroxysmal atrial fibrillation: Secondary | ICD-10-CM | POA: Diagnosis present

## 2017-03-20 LAB — GLUCOSE, CAPILLARY
GLUCOSE-CAPILLARY: 189 mg/dL — AB (ref 65–99)
GLUCOSE-CAPILLARY: 189 mg/dL — AB (ref 65–99)
GLUCOSE-CAPILLARY: 174 mg/dL — AB (ref 65–99)
GLUCOSE-CAPILLARY: 180 mg/dL — AB (ref 65–99)
Glucose-Capillary: 165 mg/dL — ABNORMAL HIGH (ref 65–99)

## 2017-03-20 LAB — BLOOD GAS, ARTERIAL
ACID-BASE EXCESS: 1.8 mmol/L (ref 0.0–2.0)
BICARBONATE: 26 mmol/L (ref 20.0–28.0)
DRAWN BY: 41977
O2 Content: 4 L/min
PATIENT TEMPERATURE: 98.6
PH ART: 7.413 (ref 7.350–7.450)
pCO2 arterial: 41.5 mmHg (ref 32.0–48.0)
pO2, Arterial: 77.1 mmHg — ABNORMAL LOW (ref 83.0–108.0)

## 2017-03-20 LAB — CBC
HEMATOCRIT: 39.5 % (ref 36.0–46.0)
HEMOGLOBIN: 13.1 g/dL (ref 12.0–15.0)
MCH: 32.2 pg (ref 26.0–34.0)
MCHC: 33.2 g/dL (ref 30.0–36.0)
MCV: 97.1 fL (ref 78.0–100.0)
Platelets: 241 10*3/uL (ref 150–400)
RBC: 4.07 MIL/uL (ref 3.87–5.11)
RDW: 12.8 % (ref 11.5–15.5)
WBC: 15.3 10*3/uL — ABNORMAL HIGH (ref 4.0–10.5)

## 2017-03-20 LAB — BASIC METABOLIC PANEL
ANION GAP: 13 (ref 5–15)
BUN: 9 mg/dL (ref 6–20)
CO2: 25 mmol/L (ref 22–32)
Calcium: 8.1 mg/dL — ABNORMAL LOW (ref 8.9–10.3)
Chloride: 95 mmol/L — ABNORMAL LOW (ref 101–111)
Creatinine, Ser: 0.7 mg/dL (ref 0.44–1.00)
GFR calc Af Amer: >60 mL/min (ref >60)
GLUCOSE: 164 mg/dL — AB (ref 65–99)
POTASSIUM: 3.1 mmol/L — AB (ref 3.5–5.1)
Sodium: 133 mmol/L — ABNORMAL LOW (ref 135–145)

## 2017-03-20 LAB — HEMOGLOBIN A1C
Hgb A1c MFr Bld: 6.6 % — ABNORMAL HIGH (ref 4.8–5.6)
Mean Plasma Glucose: 143 mg/dL

## 2017-03-20 LAB — TROPONIN I
Troponin I: 0.04 ng/mL (ref <0.03)
Troponin I: 0.05 ng/mL (ref <0.03)

## 2017-03-20 LAB — PHOSPHORUS: PHOSPHORUS: 3 mg/dL (ref 2.5–4.6)

## 2017-03-20 LAB — MAGNESIUM: Magnesium: 1.6 mg/dL — ABNORMAL LOW (ref 1.7–2.4)

## 2017-03-20 MED ORDER — METOPROLOL TARTRATE 5 MG/5ML IV SOLN: 2.5000 mg | INTRAVENOUS | Status: DC | PRN

## 2017-03-20 MED ORDER — ENOXAPARIN SODIUM 40 MG/0.4ML ~~LOC~~ SOLN
40.0000 mg | SUBCUTANEOUS | Status: DC
  Administered 2017-03-20 – 2017-03-28 (×9): 40 mg via SUBCUTANEOUS
  Filled 2017-03-20 (×9): qty 0.4

## 2017-03-20 MED ORDER — FUROSEMIDE 10 MG/ML IJ SOLN
40.0000 mg | Freq: Once | INTRAMUSCULAR | Status: AC
  Administered 2017-03-20: 40 mg via INTRAVENOUS

## 2017-03-20 MED ORDER — POTASSIUM CHLORIDE CRYS ER 20 MEQ PO TBCR
40.0000 meq | EXTENDED_RELEASE_TABLET | Freq: Once | ORAL | Status: AC
  Administered 2017-03-20: 40 meq via ORAL
  Filled 2017-03-20: qty 2

## 2017-03-20 MED ORDER — METOPROLOL TARTRATE 5 MG/5ML IV SOLN
INTRAVENOUS | Status: AC
  Administered 2017-03-20: 2.5 mg via INTRAVENOUS
  Filled 2017-03-20: qty 5

## 2017-03-20 MED ORDER — IPRATROPIUM-ALBUTEROL 0.5-2.5 (3) MG/3ML IN SOLN
3.0000 mL | RESPIRATORY_TRACT | Status: DC | PRN
  Administered 2017-03-20 – 2017-03-25 (×4): 3 mL via RESPIRATORY_TRACT
  Filled 2017-03-20 (×4): qty 3

## 2017-03-20 MED ORDER — METOPROLOL TARTRATE 25 MG PO TABS
25.0000 mg | ORAL_TABLET | Freq: Two times a day (BID) | ORAL | Status: DC
  Filled 2017-03-20 (×2): qty 1

## 2017-03-20 MED ORDER — ENOXAPARIN SODIUM 80 MG/0.8ML ~~LOC~~ SOLN: 80.0000 mg | Freq: Two times a day (BID) | SUBCUTANEOUS | Status: DC

## 2017-03-20 MED ORDER — IPRATROPIUM-ALBUTEROL 0.5-2.5 (3) MG/3ML IN SOLN
3.0000 mL | Freq: Four times a day (QID) | RESPIRATORY_TRACT | Status: DC
  Administered 2017-03-20 – 2017-03-21 (×7): 3 mL via RESPIRATORY_TRACT
  Filled 2017-03-20 (×8): qty 3

## 2017-03-20 MED ORDER — METOPROLOL TARTRATE 5 MG/5ML IV SOLN
2.5000 mg | INTRAVENOUS | Status: AC
  Administered 2017-03-20 (×4): 2.5 mg via INTRAVENOUS

## 2017-03-20 MED ORDER — METOPROLOL TARTRATE 12.5 MG HALF TABLET
12.5000 mg | ORAL_TABLET | Freq: Once | ORAL | Status: AC
  Administered 2017-03-20: 12.5 mg via ORAL
  Filled 2017-03-20: qty 1

## 2017-03-20 MED ORDER — FUROSEMIDE 10 MG/ML IJ SOLN
INTRAMUSCULAR | Status: AC
  Filled 2017-03-20: qty 4

## 2017-03-20 MED ORDER — METOPROLOL TARTRATE 25 MG PO TABS
37.5000 mg | ORAL_TABLET | Freq: Two times a day (BID) | ORAL | Status: DC
  Administered 2017-03-20 – 2017-03-22 (×4): 37.5 mg via ORAL
  Filled 2017-03-20 (×4): qty 1

## 2017-03-20 NOTE — Progress Notes (Signed)
CRITICAL VALUE ALERT  Critical Value:  Troponin 0.03  Date & Time Notied:  03/19/17 at 2003  Provider Notified: Family medicine teaching services notified at 2007  Orders Received/Actions taken: No new orders placed. MD stated that more troponin labs would be drawn throughout the night and that they would continue to monitor the troponin levels.

## 2017-03-20 NOTE — Progress Notes (Signed)
Went by to see Ms. Karen Robinsons. Patient was asleep, lying in bed, resting comfortably. Current O2 saturation of 96% on 4L O2 via nasal canula. Patient denied SOB, coughing, or discomfort. Patient had slight belly breathing on exam but in NAD. Expiratory wheezes heard bilaterally throughout lungs. Nurse informed me that prn duoneb treatment was given at midnight, with next available dose at 4am. Rapid was called on Karen Jackson around 2:30 am when patient's sats dropped to 86% but improved to 94% when O2 was increased to 4L from 2.5L. Informed nurse to page me if O2 sats decrease.  -Will continue to monitor O2 saturations, continuous pulsox -continue prn duoneb treatment -continue O2 supplementation   Caroline More, DO, PGY-1 [facility] 03/20/2017 3:34 AM

## 2017-03-20 NOTE — Progress Notes (Signed)
Breathing treatment given per MD at scheduled time and Patient is not responding. She is working to breath although her sats and respirations are WNL in 4L O2. Patient rectal temp currently 101.9 Tylenol supp given, Vital are being monitored closely. BP elevated manually it was  167/82. Rapid response ordered chest xray and it showed pulmonary edema.  MD updated with finding and patient condition Order given for lasix IV and transfer SDU.

## 2017-03-20 NOTE — Progress Notes (Signed)
Pt having coughing episode s/p adjusting position in bed with assistance from staff.  HR in AF 110s-120's as high as 140s while coughing.  When cough calmed hr remains in AF in 100s-110s.  MD notified.  BP currently 110/82, administered previously held 10:00am dose of po metoprolol 25mg .  Had been held due to pt hypotension with MD approval.

## 2017-03-20 NOTE — Progress Notes (Signed)
PT Cancellation Note  Patient Details Name: Karen Jackson MRN: [id number] DOB: 1941/04/29   Cancelled Treatment:    Reason Eval/Treat Not Completed: Medical issues which prohibited therapy (Pt in Afib with rate 112-141 at rest and cardiology has not yet seen. discussed with RN and will hold at this time)    B  03/20/2017, 1:34 PM  Elwyn Reach, [place]

## 2017-03-20 NOTE — Progress Notes (Signed)
FPTS Interim Progress Note  S: Notified by RN that patient in A-fib w/ RVR. Went to bedside with Dr. Tammi Klippel, patient appears more comfortable that before. Still has no complaints or concerns.   O: BP (!) 167/82   Pulse (!) 47   Temp (!) 101.9 F (38.8 C)   Resp (!) 22   SpO2 96%   General: diaphoretic, sitting up in bed Cardiac: Irregular rhythm and tachycardic rate, no edema Chest: More comfortable work of breathing, still tachypneic with diffuse wheezing in bilateral lung fields, no crackles appreciated  A/P: Acute Respiratory Failure: See previous interim note for A/P. Appears that she is improving slightly since examined last. Stable on 4 L [place] New onset A fib with RVR: Hemodynamically stable, BP 120/73 and HR 130-140's in room. Will add Metoprolol at this point for rate control. Will check electrolytes. Troponin 0.03>0.04, still has 1 left to check. CHADSVasc score of 7. Ideally we would like to fully anticoagulate this patient. However, she does have a hx of hemorrhagic stroke in 2013, residual left sided weakness, and hx of falls. She may not be a candidate for chronic anticoagulation based on this. Will continue DVT prophylaxis dose of Lovenox and discuss possibility of full dose of Lovenox at rounds this morning.    Carlyle Dolly, MD 03/20/2017, 6:27 AM PGY-3, [place] [facility] Service pager [phone]

## 2017-03-20 NOTE — Progress Notes (Signed)
RN called to update me on pt with O2 sats dropping to mid 80's when sleeping, increasing to 94% when she is awakened. On arrival pt lying supine in floor, skin warm and diaphoretic, Lung sounds clear through out with upper expiratory wheezing. Easily aroused with verbal stimuli, answer questions appropriately, follows commands.  Placed order for CXR Advised Shinita RN to page MD with results.  RN kept me updated on pt's condition, pt transferred to [phone]

## 2017-03-20 NOTE — Progress Notes (Signed)
Family Medicine Teaching Service Daily Progress Note Intern Pager: [phone]  Patient name: Karen Jackson Medical record number: [id number] Date of birth: [birth year] Age: 76 y.o. Gender: female  Primary Care Provider: Zenia Resides, MD Consultants: none Code Status: FULL  Pt Overview and Major Events to Date:  Admitted to telemetry on 7/23.  Rapid response called twice during the night of 7/23 due to patient's desaturations.  Newly discovered atrial fibrillation with RVR on the night of 7/23.  Assessment and Plan: Karen Jackson is a 76 y.o. female presenting with cough and SOB of five days' duration. PMH is significant for hemorrhagic stroke in 2013 with residual left sided weakness, HTN, HLD, Type 2 DM, hypomagnesemia, urinary frequency, GERD, and OA.  SOB:  On arrival meeting sepsis criteria with fever to 103F, tachycardia and tachypnea.  DDx includes PNA and PE.  Patient reports SOB with cough and congestion since 7/20.  She also noticed some left leg pain and swelling that prevented her from bearing weight.  She presented to clinic today and desatted to 87%, which increased to 92% when given 2L O2.  She is not on O2 at home and this is a new requirement for her.  She was transferred to the ED and desatted again there, so she was put on 4L O2 with SpO2 in the mid-90's.  WBC 10.4, LA 1.27 and febrile to 103F in the ED.  Given 1L NS bolus x 5, albuterol, and ipratropium in the ED. UA hazy with many bacteria, moderate leuks and positive for nitrite.  She denied dysuria however endorsing increased frequency of urination over last 2 weeks.  She was started on CTX and azithromycin in the ED.  Treating for possible pneumonia and UTI given fever however CXR negative for infiltrate.  In clinic also endorsing left leg calf tenderness and pain with dorsiflexion, concerning for DVT.  LE venous dopplers in ED negative for DVT and initial troponin also negative.  Procalcitonin negative.  Troponins 0.03 --> 0.04  --> 0.05.  BNP 241. -cardiology consulted -continuous cardiac monitoring  -continue azithromycin and ceftriaxone -daily CBC, BMET -supplemental O2 > 4L per [place], will wean as tolerated  -Tylenol PRN  -continuous pulse ox -vitals per unit routine  New-onset atrial fibrillation with RVR: EKG overnight on 7/23 showed atrial fibrillation with RVR, rate into 130s.  Patient was asymptomatic, with no chest pain or palpitations at the time.  Was given metoprolol 25 mg and was in sinus rhythm 7/24 am.  A fib could be a result of new infection or could be independent.  -cardiology to see -echo on 7/24 -will give prophylactic dose of Lovenox due to patient's hx of hemorrhagic stroke and fall risk; will obtain noncontrast MRI to assess area of past stroke and reassess Lovenox dosing   Hx of hemorrhagic stroke:  Left arm and leg are largely immobile, and patient reports increased swelling in the left leg and foot.  She is not currently on anticoagulation due to hemorrhagic stroke.  -holding cozaar-HCTZ in setting of hypotension -continue gabapentin 100 mg TID  Urinary frequency 2/2 UTI: Has been going to the bathroom hourly during the night over the past two weeks.  She says this is worse than her usual nighttime urinary frequency.  Denies dysuria.  UA positive for bacteria, nitrite, leukocyte esterase.   -continue ceftriaxone  -follow urine cx  -monitor UOP  HTN:  BP 124/61 on admission.  Takes amlodipine 10 mg daily and losartan-HCTZ 100-12.5 mg daily.  Some hypotension noted on 7/24 am -holding cozaar-HCTZ -monitor BPs   HLD:  Takes Lipitor 20 mg daily -continue home Lipitor  Type 2 DM:  Last A1C in April was 6.6.  Does not take medications for diabetes at home. -Recheck A1c  -sensitive SSI  GERD:  Takes Prilosec 20 mg at home -Protonix 40 mg daily  Hypomagnesemia.  At home on 400 mg po daily -Continue home Mag   OA:  Stable. Takes Celebrex 200 mg at home -continue home  Celebrex  FEN/GI: carb modified, protonix Prophylaxis: Lovenox  Disposition: Home  Subjective:  Karen Jackson feels improved this morning and has an SpO2 in the mid-90's on a non-rebreather with humidified air. She denies shortness of breath, chest pain, or palpitations.  She says she has never felt palpitations in the past, so she does not know if she has had episodes of atrial fibrillation in the past.  Objective: Temp:  [98.5 F (36.9 C)-103 F (39.4 C)] 101.9 F (38.8 C) (07/24 0449) Pulse Rate:  [47-131] 47 (07/24 0449) Resp:  [16-34] 22 (07/24 0435) BP: (107-176)/(53-115) 167/82 (07/24 0449) SpO2:  [72 %-97 %] 96 % (07/24 0449) Physical Exam: General: diaphoretic appearing, lying comfortably with head inclined on non-rebreather Cardiovascular: irregular rhythm, normal rate, no MRG Respiratory: CTAB, no crackles or wheezing heard Abdomen: nontender to palpation, +bowel sounds Extremities: swelling decreased in left leg  Laboratory:  Recent Labs Lab 03/19/17 1244  WBC 10.4  HGB 15.7*  HCT 45.8  PLT 215    Recent Labs Lab 03/19/17 1244  NA 131*  K 3.4*  CL 90*  CO2 29  BUN 9  CREATININE 0.68  CALCIUM 9.4  PROT 7.8  BILITOT 1.0  ALKPHOS 61  ALT 17  AST 28  GLUCOSE 185*     Imaging/Diagnostic Tests: Ct Angio Chest Pe W Or Wo Contrast  Result Date: 03/19/2017 CLINICAL DATA:  Respiratory failure EXAM: CT ANGIOGRAPHY CHEST WITH CONTRAST TECHNIQUE: Multidetector CT imaging of the chest was performed using the standard protocol during bolus administration of intravenous contrast. Multiplanar CT image reconstructions and MIPs were obtained to evaluate the vascular anatomy. CONTRAST:  75 mL Isovue 370 intravenous COMPARISON:  03/19/2017 FINDINGS: Cardiovascular: Satisfactory opacification of the pulmonary arteries to the segmental level. No evidence of pulmonary embolism. Enlarged pulmonary artery trunk, measuring 3.6 cm. Non aneurysmal aorta. No dissection.  Atherosclerotic calcification. Coronary artery calcification. Borderline to mild cardiomegaly. No pericardial effusion. Mediastinum/Nodes: Midline trachea. Nonspecific subcentimeter mediastinal lymph nodes. No thyroid mass. Esophagus within normal limits. Lungs/Pleura: Dependent atelectasis in the posterior lung bases. Scattered hazy densities bilaterally may reflect edema or diffuse atelectasis. More focal area of consolidation with surrounding ground-glass density in the left upper lobe may relate to a pneumonia. Upper Abdomen: No acute abnormality. Surgical clips in the right upper quadrant and between the liver and stomach. Musculoskeletal: Degenerative changes. No acute or suspicious bone lesion. Review of the MIP images confirms the above findings. IMPRESSION: 1. Negative for acute pulmonary embolus. 2. Scattered foci of hazy density suggesting diffuse atelectasis or edema. Focal area of mild consolidation and ground-glass density in the left upper lobe is suspicious for focal pneumonia. 3. Enlarged main pulmonary artery trunk suggests pulmonary hypertension. Aortic Atherosclerosis (ICD10-I70.0). Electronically Signed   By: Donavan Foil M.D.   On: 03/19/2017 22:22   Dg Chest Port 1 View  Result Date: 03/20/2017 CLINICAL DATA:  Acute respiratory distress. EXAM: PORTABLE CHEST 1 VIEW COMPARISON:  Radiographs and CTA yesterday. FINDINGS: Low lung volumes  persist. Atherosclerosis of the aortic arch. Linear atelectasis in the right midlung versus fluid in the fissure. Worsening diffuse hazy opacities suspicious for pulmonary edema. Ground-glass opacity in the left upper lobe on CT is only faintly visualized. No large pleural effusion. No pneumothorax. IMPRESSION: Low lung volumes. Worsening diffuse hazy opacities suspicious for increasing pulmonary edema. Ground-glass opacity in the left upper lobe on CT is only faintly visualized radiographically. Electronically Signed   By: Jeb Levering M.D.   On:  03/20/2017 03:30   Dg Chest Portable 1 View  Result Date: 03/19/2017 CLINICAL DATA:  Shortness of breath EXAM: PORTABLE CHEST 1 VIEW COMPARISON:  07/13/2010 FINDINGS: Cardiac shadow is stable. Patient is somewhat rotated to the right accentuating the mediastinum markings. Lungs are well aerated bilaterally. Minimal right basilar atelectasis is seen. Old rib fractures are noted on the left and stable. IMPRESSION: Minimal right basilar atelectasis.  No acute abnormality noted. Electronically Signed   By: Inez Catalina M.D.   On: 03/19/2017 13:34     Kathrene Alu, MD 03/20/2017, 7:23 AM PGY-1, [place] Intern pager: [phone], text pages welcome

## 2017-03-20 NOTE — Progress Notes (Signed)
Attempted Report 

## 2017-03-20 NOTE — Progress Notes (Signed)
Patient resting confortably, O@ sats found to be 86% bumped up to 4L patient sated now 90-91%, Notified rapid and put patient on radar. When I awake patient her sats went up to 94%. Will continue to monitor closely. Patient on continues pulse ox.

## 2017-03-20 NOTE — Progress Notes (Signed)
Text page sent to family medicine MD to notify tylenol being given for temp of 101.9

## 2017-03-20 NOTE — Progress Notes (Signed)
While receiving bedside report patient with audible wheezing. Patient O2 sats 89-90% on 2L bumped O2 up to 2.5L O2 sats up to 91-93% and call respiratory.  Respiratory implemented the protocol and started PRN breathing treatment.

## 2017-03-20 NOTE — Progress Notes (Signed)
FPTS Interim Progress Note  S: Went to bedside with Dr. Tammi Klippel to evaluate patient as her respiratory status was noted to be worsening. Patient appears to be working hard to breathe but states she "feels fine" and has no concerns. Patient has increased work of breathing. S/p 1 duoneb without any improvement.   O: BP (!) 167/82   Pulse (!) 47   Temp (!) 101.9 F (38.8 C)   Resp 20   SpO2 96%   General: obese female, sitting up in bed, in mild distress Cardiac: tachycardic rate, regular rhythm, no edema in extremities, palpable DP pulses bilaterally  Resp: Increased work of breathing and respiratory rate, wheezing heard throughout in bilateral lung fields, difficult to tell if there are crackles   A/P: Acute Respiratory Failure: Unresponsive to duoneb. Likely secondary to pneumonia in left upper lobe based on CT. Currently has O2 saturation in the mid 90's on 4L [place] but remains tachypneic. PE has been rule out with CTA. CXR reading as worsening diffuse hazy opacities suspicious for pulmonary edema. Less likely new onset CHF and patient appears euvolemic, however her BNP is slighly elevated at 241.1. EKG showing sinus tachycardia, no real ST elevation or depression. We will need to consider possibility of ARDS given her declining respiratory status. The plan is as follows:  - Stat ABG - Increase O2 with goal >92% - Lasix 40 mg IV x 1 - Continue Duonebs PRN - Transfer to SDU - Will continue to monitor closely, if patient continues to decline, will touch base with CCM.    Carlyle Dolly, MD 03/20/2017, 5:16 AM PGY-3, [place] [facility] Service pager [phone]

## 2017-03-20 NOTE — Consult Note (Signed)
Cardiology Consult    Patient ID: Karen Jackson MRN: [id number], DOB/AGE: 1941-03-02   Admit date: 03/19/2017 Date of Consult: 03/20/2017  Primary Physician: Zenia Resides, MD Primary Cardiologist: new - Dr. Ellyn Hack Requesting Provider: Dr. Andria Frames  Reason for Consult: new onset atrial fibrillation  Patient Profile    Karen Jackson has a PMH significant for prior nontraumatic hemorrhagic stroke (2013) with residual left-sided paralysis, GERD, DM2, HTN, HLD, and arthritis. She was brought to [facility] with shortness of breath and code sepsis was called. Overnight on 03/19/17, she was noted to be in Afib RVR.   Karen Jackson is a 76 y.o. female who is being seen today for the evaluation of Afib at the request of Dr. Andria Frames.   Past Medical History   Past Medical History:  Diagnosis Date  . Arthritis   . Dyspnea   . Fever of unknown origin 03/19/2017  . Hyperlipidemia   . Hypertension   . Osteopenia   . Stroke [facility]) 2013    Past Surgical History:  Procedure Laterality Date  . ANKLE SURGERY    . APPENDECTOMY    . CHOLECYSTECTOMY    . HERNIA REPAIR     Esophagus  . JOINT REPLACEMENT     total- right partial- left  . MASTECTOMY PARTIAL / LUMPECTOMY  2012   left     Allergies  Allergies  Allergen Reactions  . Codeine Phosphate     REACTION: unspecified  . Simvastatin     REACTION: myalgias    History of Present Illness    Karen Jackson presented to [facility] with shortness of breath and was found to have a UTI. She is now requiring supplemental oxygen with audible wheezing. She was placed on FM service and is undergoing antibiotic treatment. Cardiology was consulted for conversion to Afib RVR in the 130s.  On my interview, Karen Jackson has left-sided paralysis as a consequence of a previous hemorrhagic stroke. She denies chest pain, palpitations, dizziness, lightheadedness, and feelings of syncope. She suffered a "slide out of bed" fall on Saturday, but did not sustain injuries. She denies  any previous falls. She is mobile with a walker and attends PT. She is largely asymptomatic with this arrhythmia. Head CT is pending to rule out brain hemorrhage.   Inpatient Medications    . amLODipine  10 mg Oral Daily  . atorvastatin  20 mg Oral Daily  . celecoxib  200 mg Oral Daily  . enoxaparin (LOVENOX) injection  40 mg Subcutaneous Q24H  . gabapentin  100 mg Oral TID  . losartan  100 mg Oral Daily   Or  . hydrochlorothiazide  12.5 mg Oral Daily  . insulin aspart  0-9 Units Subcutaneous TID WC  . ipratropium-albuterol  3 mL Nebulization QID  . magnesium oxide  400 mg Oral Daily  . metoprolol tartrate  25 mg Oral BID  . pantoprazole  40 mg Oral Daily     Outpatient Medications    Prior to Admission medications   Medication Sig Start Date End Date Taking? Authorizing Provider  amLODipine (NORVASC) 10 MG tablet TAKE 1 TABLET BY MOUTH DAILY 12/14/16  Yes Hensel, Jamal Collin, MD  atorvastatin (LIPITOR) 20 MG tablet TAKE 1 TABLET BY MOUTH EVERY DAY 02/01/17  Yes Hensel, Jamal Collin, MD  Calcium Carb-Cholecalciferol (CALCIUM CARBONATE-VITAMIN D3) 600-400 MG-UNIT TABS Take 1 tablet by mouth daily. 04/27/14  Yes Hensel, Jamal Collin, MD  celecoxib (CELEBREX) 200 MG capsule TAKE 1 CAPSULE BY MOUTH EVERY DAY  WITH FOOD 07/24/16  Yes Hensel, Jamal Collin, MD  diclofenac sodium (VOLTAREN) 1 % GEL APPLY 2 GRAMS EXTERNALLY TO THE AFFECTED AREA FOUR TIMES DAILY 06/29/16  Yes Hensel, Jamal Collin, MD  gabapentin (NEURONTIN) 100 MG capsule TAKE 1 CAPSULE BY MOUTH THREE TIMES DAILY 09/15/16  Yes Hensel, Jamal Collin, MD  losartan-hydrochlorothiazide (HYZAAR) 100-12.5 MG tablet Take 1 tablet by mouth daily. 02/16/17  Yes Zenia Resides, MD  MAGNESIUM-OXIDE 400 (241.3 Mg) MG tablet TAKE 1 TABLET BY MOUTH DAILY 09/28/16  Yes Hensel, Jamal Collin, MD  omeprazole (PRILOSEC) 20 MG capsule TAKE 1 CAPSULE BY MOUTH EVERY DAY 08/03/16  Yes Hensel, Jamal Collin, MD  traZODone (DESYREL) 50 MG tablet TAKE 1/2 TO 1 TABLET BY MOUTH AT  BEDTIME AS NEEDED FOR SLEEP 11/30/16  Yes Hensel, Jamal Collin, MD     Family History     Family History  Problem Relation Age of Onset  . Diabetes Mother     Social History    Social History   Social History  . Marital status: Widowed    Spouse name: N/A  . Number of children: 2  . Years of education: college   Occupational History  . RetiredGames developer    Social History Main Topics  . Smoking status: Former Smoker    Types: Cigarettes    Quit date: 03/09/1981  . Smokeless tobacco: Never Used  . Alcohol use 7.0 oz/week    14 Standard drinks or equivalent per week  . Drug use: No  . Sexual activity: Not Currently   Other Topics Concern  . Not on file   Social History Narrative   Health Care POA: son, Allen Kell   Emergency Contact: Lelon Frohlich and Roosvelt Harps, niece & nephew (c) [phone] (h) [phone]   End of Life Plan:    Who lives with you: self   Any pets: none   Diet: Pt has a varied diet of protein, vegetables and limits "white" foods.   Exercise: Pt does water aerobics 3x week, golf 2x week   Seatbelts: Pt reports wearing seatbelt when in vehicle.    Sun Exposure/Protection: Pt reports wearing sun screen   Hobbies: golfing, swimming            Review of Systems    General:  No chills, fever, night sweats or weight changes.  Cardiovascular:  No chest pain, dyspnea on exertion, edema, orthopnea, palpitations, paroxysmal nocturnal dyspnea. Dermatological: No rash, lesions/masses Respiratory: No cough, dyspnea Urologic: No hematuria, dysuria Abdominal:   No nausea, vomiting, diarrhea, bright red blood per rectum, melena, or hematemesis Neurologic:  No visual changes, wkns, changes in mental status. All other systems reviewed and are otherwise negative except as noted above.  Physical Exam    Blood pressure 109/67, pulse 80, temperature 98.6 F (37 C), temperature source Oral, resp. rate (!) 23, SpO2 94 %.  General: Pleasant, NAD Psych: Normal affect. Neuro:  Alert and oriented X 3. Moves right side freely, left-sided paralysis  HEENT: Normal  Neck: Supple without bruits or JVD. Lungs:  Resp regular and unlabored, expiratory wheezing Heart: Irregular rhythm, regular rate, no murmurs. Abdomen: Soft, non-tender, non-distended, BS + x 4.  Extremities: No clubbing, cyanosis or edema. DP/PT/Radials 2+ and equal bilaterally.  Labs    Troponin (Point of Care Test) No results for input(s): TROPIPOC in the last 72 hours.  Recent Labs  03/19/17 1846 03/20/17 0134 03/20/17 0645  TROPONINI 0.03* 0.04* 0.05*   Lab Results  Component Value Date  WBC 15.3 (H) 03/20/2017   HGB 13.1 03/20/2017   HCT 39.5 03/20/2017   MCV 97.1 03/20/2017   PLT 241 03/20/2017    Recent Labs Lab 03/19/17 1244 03/20/17 0645  NA 131* 133*  K 3.4* 3.1*  CL 90* 95*  CO2 29 25  BUN 9 9  CREATININE 0.68 0.70  CALCIUM 9.4 8.1*  PROT 7.8  --   BILITOT 1.0  --   ALKPHOS 61  --   ALT 17  --   AST 28  --   GLUCOSE 185* 164*   Lab Results  Component Value Date   CHOL 198 11/30/2016   HDL 87 11/30/2016   LDLCALC 92 11/30/2016   TRIG 96 11/30/2016   Lab Results  Component Value Date   DDIMER 0.77 (H) 03/19/2017     Radiology Studies    Ct Angio Chest Pe W Or Wo Contrast  Result Date: 03/19/2017 CLINICAL DATA:  Respiratory failure EXAM: CT ANGIOGRAPHY CHEST WITH CONTRAST TECHNIQUE: Multidetector CT imaging of the chest was performed using the standard protocol during bolus administration of intravenous contrast. Multiplanar CT image reconstructions and MIPs were obtained to evaluate the vascular anatomy. CONTRAST:  75 mL Isovue 370 intravenous COMPARISON:  03/19/2017 FINDINGS: Cardiovascular: Satisfactory opacification of the pulmonary arteries to the segmental level. No evidence of pulmonary embolism. Enlarged pulmonary artery trunk, measuring 3.6 cm. Non aneurysmal aorta. No dissection. Atherosclerotic calcification. Coronary artery calcification. Borderline  to mild cardiomegaly. No pericardial effusion. Mediastinum/Nodes: Midline trachea. Nonspecific subcentimeter mediastinal lymph nodes. No thyroid mass. Esophagus within normal limits. Lungs/Pleura: Dependent atelectasis in the posterior lung bases. Scattered hazy densities bilaterally may reflect edema or diffuse atelectasis. More focal area of consolidation with surrounding ground-glass density in the left upper lobe may relate to a pneumonia. Upper Abdomen: No acute abnormality. Surgical clips in the right upper quadrant and between the liver and stomach. Musculoskeletal: Degenerative changes. No acute or suspicious bone lesion. Review of the MIP images confirms the above findings. IMPRESSION: 1. Negative for acute pulmonary embolus. 2. Scattered foci of hazy density suggesting diffuse atelectasis or edema. Focal area of mild consolidation and ground-glass density in the left upper lobe is suspicious for focal pneumonia. 3. Enlarged main pulmonary artery trunk suggests pulmonary hypertension. Aortic Atherosclerosis (ICD10-I70.0). Electronically Signed   By: Donavan Foil M.D.   On: 03/19/2017 22:22   Dg Chest Port 1 View  Result Date: 03/20/2017 CLINICAL DATA:  Acute respiratory distress. EXAM: PORTABLE CHEST 1 VIEW COMPARISON:  Radiographs and CTA yesterday. FINDINGS: Low lung volumes persist. Atherosclerosis of the aortic arch. Linear atelectasis in the right midlung versus fluid in the fissure. Worsening diffuse hazy opacities suspicious for pulmonary edema. Ground-glass opacity in the left upper lobe on CT is only faintly visualized. No large pleural effusion. No pneumothorax. IMPRESSION: Low lung volumes. Worsening diffuse hazy opacities suspicious for increasing pulmonary edema. Ground-glass opacity in the left upper lobe on CT is only faintly visualized radiographically. Electronically Signed   By: Jeb Levering M.D.   On: 03/20/2017 03:30   Dg Chest Portable 1 View  Result Date:  03/19/2017 CLINICAL DATA:  Shortness of breath EXAM: PORTABLE CHEST 1 VIEW COMPARISON:  07/13/2010 FINDINGS: Cardiac shadow is stable. Patient is somewhat rotated to the right accentuating the mediastinum markings. Lungs are well aerated bilaterally. Minimal right basilar atelectasis is seen. Old rib fractures are noted on the left and stable. IMPRESSION: Minimal right basilar atelectasis.  No acute abnormality noted. Electronically Signed   By: Elta Guadeloupe  Lukens M.D.   On: 03/19/2017 13:34    ECG & Cardiac Imaging    EKG 03/20/17: Afib with RVR  EKG 03/19/17: sinus tachycardia  Echocardiogram: pending  Assessment & Plan    1. Atrial fibrillation / atrial flutter - per EKG today 03/20/17 - telemetry review with bouts of Afib and atrial flutter, she was sinus tachycardia on admission - primary team started 25 mg lopressor with some rate control with bouts of RVR - increased lopressor to 37.5 mg BID - titrate lopressor for rate control - echocardiogram pending - this is likely related to her underlying infection (UTI) and respiratory status and may resolve as the infection resolves  This patients CHA2DS2-VASc Score and unadjusted Ischemic Stroke Rate (% per year) is equal to 11.2 % stroke rate/year from a score of 7 (HTN, DM, age, female, stroke)  - given her history of hemorrhagic stroke, would recommend holding anticoagulation until after head CT confirms lack of hemorrhage - recommend consulting neurology for input for anticoagulation AND appropriateness for NOAC (xarelto, eliquis) given her stroke history - she is also not a DCCV candidate if she can't be anticoagulated, no urgent DCCV needed as she is HDS and asymptomatic   2. HTN - continue home meds, may decrease HCTZ or losartan if pressure doesn't tolerate increased dose of lopressor, but we think this is unlikely   3. DM - per primary team   4. UTI, SOB - per primary team, on ABX   Signed, Ledora Bottcher,  PA-C 03/20/2017, 1:07 PM [phone]   I have seen, examined and evaluated the patient this PM along with KarenDuke, PA.  After reviewing all the available data and chart, we discussed the patients laboratory, study & physical findings as well as symptoms in detail. I agree with her findings, examination as well as impression recommendations as per our discussion.    Very pleasant 77 year old with a history of nontraumatic hemorrhagic CVA in 2013 who now presents with what appears to be pneumonia versus UTI related sepsis and has converted into atrial fibrillation/flutter (on my review of the telemetry appears to be atrial flutter, however on EKG and is clearly coarse A. Fib).  She has no sensation of rapid irregular heartbeats or palpitations. Her rate is currently well controlled on beta blocker which we have increased to 37.5 twice a day.  At this point I would simply rate control at this episode is quite likely related to her ongoing illness. Would likely consider outpatient monitor to determine A. fib burden once she is discharged.  Biggest question is that of anticoagulation. I discussed with neurology on both the on-call neurologist and Dr. Leonie Man reference the prior hemorrhagic stroke. They both agree with the plan already in place to check an MRI to determine if there is any potential source of bleed prior to consideration of adding DOAC. If indeed the etiology of her stroke was ischemic stroke related hemorrhagic stroke, then certainly this would be considered a high risk for recurrent stroke and would warrant DOAC.  Audiology will follow along. We can discuss MRI results with neurology for her to making decision about any coagulation.     Glenetta Hew, M.D., M.S. Interventional Cardiologist   Pager # [phone] Phone # [phone] [address] [place], [place] [address]

## 2017-03-21 ENCOUNTER — Encounter (HOSPITAL_COMMUNITY): Payer: Self-pay | Admitting: Family Medicine

## 2017-03-21 ENCOUNTER — Inpatient Hospital Stay (HOSPITAL_COMMUNITY): Payer: Medicare Other

## 2017-03-21 DIAGNOSIS — I5032 Chronic diastolic (congestive) heart failure: Secondary | ICD-10-CM | POA: Diagnosis present

## 2017-03-21 DIAGNOSIS — I5031 Acute diastolic (congestive) heart failure: Secondary | ICD-10-CM

## 2017-03-21 DIAGNOSIS — J9601 Acute respiratory failure with hypoxia: Secondary | ICD-10-CM

## 2017-03-21 DIAGNOSIS — I340 Nonrheumatic mitral (valve) insufficiency: Secondary | ICD-10-CM

## 2017-03-21 DIAGNOSIS — J189 Pneumonia, unspecified organism: Secondary | ICD-10-CM | POA: Diagnosis present

## 2017-03-21 DIAGNOSIS — J9690 Respiratory failure, unspecified, unspecified whether with hypoxia or hypercapnia: Secondary | ICD-10-CM | POA: Diagnosis present

## 2017-03-21 LAB — GLUCOSE, CAPILLARY
GLUCOSE-CAPILLARY: 135 mg/dL — AB (ref 65–99)
Glucose-Capillary: 155 mg/dL — ABNORMAL HIGH (ref 65–99)
Glucose-Capillary: 192 mg/dL — ABNORMAL HIGH (ref 65–99)
Glucose-Capillary: 134 mg/dL — ABNORMAL HIGH (ref 65–99)

## 2017-03-21 LAB — BASIC METABOLIC PANEL
Anion gap: 10 (ref 5–15)
BUN: 15 mg/dL (ref 6–20)
CALCIUM: 8.2 mg/dL — AB (ref 8.9–10.3)
CHLORIDE: 95 mmol/L — AB (ref 101–111)
CO2: 27 mmol/L (ref 22–32)
CREATININE: 0.66 mg/dL (ref 0.44–1.00)
GFR calc non Af Amer: >60 mL/min (ref >60)
Glucose, Bld: 150 mg/dL — ABNORMAL HIGH (ref 65–99)
Potassium: 4.1 mmol/L (ref 3.5–5.1)
SODIUM: 132 mmol/L — AB (ref 135–145)

## 2017-03-21 LAB — CBC
HCT: 39.4 % (ref 36.0–46.0)
HEMOGLOBIN: 13.4 g/dL (ref 12.0–15.0)
MCH: 33.3 pg (ref 26.0–34.0)
MCHC: 34 g/dL (ref 30.0–36.0)
MCV: 98 fL (ref 78.0–100.0)
PLATELETS: 221 10*3/uL (ref 150–400)
RBC: 4.02 MIL/uL (ref 3.87–5.11)
RDW: 12.9 % (ref 11.5–15.5)
WBC: 12.6 10*3/uL — ABNORMAL HIGH (ref 4.0–10.5)

## 2017-03-21 LAB — C DIFFICILE QUICK SCREEN W PCR REFLEX
C DIFFICILE (CDIFF) INTERP: NOT DETECTED
C DIFFICILE (CDIFF) TOXIN: NEGATIVE
C DIFFICLE (CDIFF) ANTIGEN: NEGATIVE

## 2017-03-21 LAB — URINE CULTURE: CULTURE: NO GROWTH

## 2017-03-21 LAB — ECHOCARDIOGRAM COMPLETE

## 2017-03-21 MED ORDER — IPRATROPIUM-ALBUTEROL 0.5-2.5 (3) MG/3ML IN SOLN
3.0000 mL | RESPIRATORY_TRACT | Status: DC
  Administered 2017-03-21 – 2017-03-22 (×4): 3 mL via RESPIRATORY_TRACT
  Filled 2017-03-21 (×4): qty 3

## 2017-03-21 MED ORDER — LOSARTAN POTASSIUM 50 MG PO TABS
50.0000 mg | ORAL_TABLET | Freq: Every day | ORAL | Status: DC
  Administered 2017-03-22 – 2017-03-23 (×2): 50 mg via ORAL
  Filled 2017-03-21 (×2): qty 1

## 2017-03-21 MED ORDER — FUROSEMIDE 10 MG/ML IJ SOLN
40.0000 mg | Freq: Two times a day (BID) | INTRAMUSCULAR | Status: AC
Start: 2017-03-21 — End: 2017-03-22
  Administered 2017-03-21 – 2017-03-22 (×3): 40 mg via INTRAVENOUS
  Filled 2017-03-21 (×3): qty 4

## 2017-03-21 MED ORDER — FUROSEMIDE 10 MG/ML IJ SOLN
INTRAMUSCULAR | Status: AC
  Administered 2017-03-21: 40 mg via INTRAVENOUS
  Filled 2017-03-21: qty 4

## 2017-03-21 MED ORDER — FUROSEMIDE 10 MG/ML IJ SOLN
40.0000 mg | Freq: Once | INTRAMUSCULAR | Status: AC
  Administered 2017-03-21: 40 mg via INTRAVENOUS

## 2017-03-21 NOTE — Evaluation (Signed)
Occupational Therapy Evaluation Patient Details Name: Karen Jackson MRN: [id number] DOB: [birth year] Today's Date: 03/21/2017    History of Present Illness Pt is a 76 y/o female admitted secondary to cough and SOB. Pt with worsening respiratory function and went into acute respiratory failure requiring supplemental O2 and BiPAP. Pt now on 4L of O2 via [place]. Pt also with new onset a-fib with RVR. PMH including but not limited to HTN, DM, and hx of hemorrhagic CVA with residual L sided weakness.   Clinical Impression   Pt reports she required assist with ADL from her caregivers PTA. Currently pt requires min assist +2 for safety with sit to stand from EOB and few side steps toward [facility] for repositioning. Pt requires min assist for UB ADL and max assist for LB ADL. Pt presenting with decreased activity tolerance and desats with activity; SpO2 >83% on 4-6L throughout session. Educated on pursed lip breathing. At this time recommending SNF for follow up to maximize independence and safety with ADL and functional mobility. Pending progress; pt may be able to d/c home with [facility] and caregiver support. Pt would benefit from continued skilled OT to address established goals.    Follow Up Recommendations  SNF;Supervision/Assistance - 24 hour (pending progress, may be able to d/c home with [facility])    Equipment Recommendations  None recommended by OT    Recommendations for Other Services       Precautions / Restrictions Precautions Precautions: Fall Precaution Comments: watch SPO2 Required Braces or Orthoses: Other Brace/Splint Other Brace/Splint: L ankle brace and shoes for mobility Restrictions Weight Bearing Restrictions: No      Mobility Bed Mobility Overal bed mobility: Needs Assistance Bed Mobility: Supine to Sit;Sit to Supine     Supine to sit: Mod assist;+2 for physical assistance;HOB elevated Sit to supine: Min assist   General bed mobility comments: increased time and effort, use of bed  rails, assist at bilateral LEs and trunk to achieve sitting EOB with use of bed pads to position hips.   Transfers Overall transfer level: Needs assistance Equipment used: Rolling walker (2 wheeled) Transfers: Sit to/from Stand Sit to Stand: Min assist;+2 safety/equipment         General transfer comment: increased time and effort, pt pulling up with bilateral UEs on RW, physical assist provided for pt to power into standing and for stability. pt performed sit<>stand from EOB x2.    Balance Overall balance assessment: Needs assistance Sitting-balance support: Feet supported Sitting balance-Leahy Scale: Fair Sitting balance - Comments: pt able to sit EOB with supervision   Standing balance support: During functional activity;Bilateral upper extremity supported Standing balance-Leahy Scale: Poor Standing balance comment: pt reliant on bilateral UEs on RW                           ADL either performed or assessed with clinical judgement   ADL Overall ADL's : Needs assistance/impaired Eating/Feeding: Set up   Grooming: Minimal assistance;Sitting   Upper Body Bathing: Minimal assistance;Sitting   Lower Body Bathing: Maximal assistance;Sit to/from stand   Upper Body Dressing : Minimal assistance;Sitting   Lower Body Dressing: Maximal assistance;Sit to/from stand                 General ADL Comments: Pt able to perform sit to stand from EOB x2 with min assist +2 for safety and take side steps toward [facility] for repositioning. SpO2 >83% on 4-6L supplemental O2; educated on seated rest  break and pursed lip breathing.     Vision Baseline Vision/History: Wears glasses Vision Assessment?: No apparent visual deficits     Perception     Praxis      Pertinent Vitals/Pain Pain Assessment: No/denies pain     Hand Dominance     Extremity/Trunk Assessment Upper Extremity Assessment Upper Extremity Assessment: LUE deficits/detail;Generalized weakness LUE Deficits  / Details: residual strength and ROM deficits from prior CVA   Lower Extremity Assessment Lower Extremity Assessment: Defer to PT evaluation    Cervical / Trunk Assessment Cervical / Trunk Assessment: Kyphotic   Communication Communication Communication: No difficulties   Cognition Arousal/Alertness: Awake/alert Behavior During Therapy: WFL for tasks assessed/performed Overall Cognitive Status: Within Functional Limits for tasks assessed                                     General Comments       Exercises     Shoulder Instructions      Home Living Family/patient expects to be discharged to:: Private residence Living Arrangements: Alone Available Help at Discharge: Personal care attendant;Available PRN/intermittently Type of Home: House Home Access: Ramped entrance     Home Layout: One level     Bathroom Shower/Tub: Occupational psychologist: Handicapped height     Home Equipment: Environmental consultant - 2 wheels;Shower seat;Wheelchair - manual;Bedside commode;Grab bars - tub/shower          Prior Functioning/Environment Level of Independence: Needs assistance  Gait / Transfers Assistance Needed: ambulates with RW inside home, uses a w/c for community distances ADL's / Homemaking Assistance Needed: requires assistance with ADLs, helps with UB bathing and dressing            OT Problem List: Decreased strength;Decreased range of motion;Decreased activity tolerance;Impaired balance (sitting and/or standing);Decreased knowledge of precautions;Cardiopulmonary status limiting activity;Impaired tone;Impaired UE functional use      OT Treatment/Interventions: Self-care/ADL training;Therapeutic exercise;Energy conservation;DME and/or AE instruction;Therapeutic activities;Patient/family education;Balance training    OT Goals(Current goals can be found in the care plan section) Acute Rehab OT Goals Patient Stated Goal: to return to PLOF OT Goal Formulation:  With patient Time For Goal Achievement: 04/04/17 Potential to Achieve Goals: Good ADL Goals Pt Will Transfer to Toilet: with modified independence;ambulating;regular height toilet Pt Will Perform Toileting - Clothing Manipulation and hygiene: with modified independence;sit to/from stand Pt Will Perform Tub/Shower Transfer: with min guard assist;ambulating;shower seat;grab bars;rolling walker Additional ADL Goal #1: Pt will independently verbally recall 3 energy conservation strategies and use during ADL.  OT Frequency: Min 2X/week   Barriers to D/C: Decreased caregiver support  pt lives alone with intermittent assist from caregivers       Co-evaluation PT/OT/SLP Co-Evaluation/Treatment: Yes Reason for Co-Treatment: For patient/therapist safety;To address functional/ADL transfers PT goals addressed during session: Mobility/safety with mobility;Balance;Proper use of DME;Strengthening/ROM OT goals addressed during session: ADL's and self-care      AM-PAC PT "6 Clicks" Daily Activity     Outcome Measure Help from another person eating meals?: None Help from another person taking care of personal grooming?: A Little Help from another person toileting, which includes using toliet, bedpan, or urinal?: A Lot Help from another person bathing (including washing, rinsing, drying)?: A Lot Help from another person to put on and taking off regular upper body clothing?: A Little Help from another person to put on and taking off regular lower body clothing?: A Lot 6  Click Score: 16   End of Session Equipment Utilized During Treatment: Gait belt;Rolling walker;Oxygen;Other (comment) (L ankle brace)  Activity Tolerance: Patient tolerated treatment well Patient left: in bed;with call bell/phone within reach  OT Visit Diagnosis: Unsteadiness on feet (R26.81);Muscle weakness (generalized) (M62.81)                Time: 7673-4193 OT Time Calculation (min): 33 min Charges:  OT General Charges $OT  Visit: 1 Procedure OT Evaluation $OT Eval Moderate Complexity: 1 Procedure G-Codes:      A. Ulice Brilliant, M.S., OTR/L Pager: [place] 03/21/2017, 3:29 PM

## 2017-03-21 NOTE — Progress Notes (Signed)
Progress Note  Patient Name: Karen Jackson Date of Encounter: 03/21/2017  Primary Cardiologist: Ellyn Hack  Subjective   Has another episode of dyspnea this morning with RR RN called. Now improved. Planning to work with PT.   Inpatient Medications    Scheduled Meds: . amLODipine  10 mg Oral Daily  . atorvastatin  20 mg Oral Daily  . celecoxib  200 mg Oral Daily  . enoxaparin (LOVENOX) injection  40 mg Subcutaneous Q24H  . gabapentin  100 mg Oral TID  . losartan  100 mg Oral Daily   Or  . hydrochlorothiazide  12.5 mg Oral Daily  . insulin aspart  0-9 Units Subcutaneous TID WC  . ipratropium-albuterol  3 mL Nebulization QID  . magnesium oxide  400 mg Oral Daily  . metoprolol tartrate  37.5 mg Oral BID  . pantoprazole  40 mg Oral Daily   Continuous Infusions: . azithromycin Stopped (03/20/17 1534)  . cefTRIAXone (ROCEPHIN)  IV Stopped (03/20/17 1329)   PRN Meds: acetaminophen **OR** acetaminophen, ipratropium-albuterol, metoprolol tartrate, promethazine, traZODone   Vital Signs    Vitals:   03/21/17 0646 03/21/17 0752 03/21/17 0810 03/21/17 1200  BP:      Pulse: 94     Resp: (!) 21     Temp: 97.8 F (36.6 C) 100.2 F (37.9 C)    TempSrc: Axillary Axillary    SpO2: 95%  97% (!) 88%    Intake/Output Summary (Last 24 hours) at 03/21/17 1202 Last data filed at 03/20/17 1509  Gross per 24 hour  Intake                0 ml  Output              100 ml  Net             -100 ml   There were no vitals filed for this visit.  Telemetry    SR - Personally Reviewed  ECG    N/a - Personally Reviewed  Physical Exam   General: Older W female slightly dyspneic and diaphoretic. Head: Normocephalic, atraumatic.  Neck: Supple without bruits, JVD. Lungs:  Resp regular and unlabored, Diminished bilaterally. Heart: RRR, S1, S2, no S3, S4, or murmur; no rub. Abdomen: Soft, non-tender, non-distended with normoactive bowel sounds. No hepatomegaly. No rebound/guarding. No  obvious abdominal masses. Extremities: No clubbing, cyanosis, mild edema. Distal pedal pulses are 2+ bilaterally. Neuro: Alert and oriented X 3. Left sided paralysis. Psych: Normal affect.  Labs    Chemistry Recent Labs Lab 03/19/17 1244 03/20/17 0645 03/21/17 0418  NA 131* 133* 132*  K 3.4* 3.1* 4.1  CL 90* 95* 95*  CO2 29 25 27   GLUCOSE 185* 164* 150*  BUN 9 9 15   CREATININE 0.68 0.70 0.66  CALCIUM 9.4 8.1* 8.2*  PROT 7.8  --   --   ALBUMIN 4.2  --   --   AST 28  --   --   ALT 17  --   --   ALKPHOS 61  --   --   BILITOT 1.0  --   --   GFRNONAA >60 >60 >60  GFRAA >60 >60 >60  ANIONGAP 12 13 10      Hematology Recent Labs Lab 03/19/17 1244 03/20/17 0645 03/21/17 0418  WBC 10.4 15.3* 12.6*  RBC 4.72 4.07 4.02  HGB 15.7* 13.1 13.4  HCT 45.8 39.5 39.4  MCV 97.0 97.1 98.0  MCH 33.3 32.2 33.3  MCHC 34.3 33.2  34.0  RDW 12.5 12.8 12.9  PLT 215 241 221    Cardiac Enzymes Recent Labs Lab 03/19/17 1846 03/20/17 0134 03/20/17 0645  TROPONINI 0.03* 0.04* 0.05*   No results for input(s): TROPIPOC in the last 168 hours.   BNP Recent Labs Lab 03/19/17 1846  BNP 241.1*     DDimer  Recent Labs Lab 03/19/17 1846  DDIMER 0.77*      Radiology    Ct Angio Chest Pe W Or Wo Contrast  Result Date: 03/19/2017 CLINICAL DATA:  Respiratory failure EXAM: CT ANGIOGRAPHY CHEST WITH CONTRAST TECHNIQUE: Multidetector CT imaging of the chest was performed using the standard protocol during bolus administration of intravenous contrast. Multiplanar CT image reconstructions and MIPs were obtained to evaluate the vascular anatomy. CONTRAST:  75 mL Isovue 370 intravenous COMPARISON:  03/19/2017 FINDINGS: Cardiovascular: Satisfactory opacification of the pulmonary arteries to the segmental level. No evidence of pulmonary embolism. Enlarged pulmonary artery trunk, measuring 3.6 cm. Non aneurysmal aorta. No dissection. Atherosclerotic calcification. Coronary artery calcification.  Borderline to mild cardiomegaly. No pericardial effusion. Mediastinum/Nodes: Midline trachea. Nonspecific subcentimeter mediastinal lymph nodes. No thyroid mass. Esophagus within normal limits. Lungs/Pleura: Dependent atelectasis in the posterior lung bases. Scattered hazy densities bilaterally may reflect edema or diffuse atelectasis. More focal area of consolidation with surrounding ground-glass density in the left upper lobe may relate to a pneumonia. Upper Abdomen: No acute abnormality. Surgical clips in the right upper quadrant and between the liver and stomach. Musculoskeletal: Degenerative changes. No acute or suspicious bone lesion. Review of the MIP images confirms the above findings. IMPRESSION: 1. Negative for acute pulmonary embolus. 2. Scattered foci of hazy density suggesting diffuse atelectasis or edema. Focal area of mild consolidation and ground-glass density in the left upper lobe is suspicious for focal pneumonia. 3. Enlarged main pulmonary artery trunk suggests pulmonary hypertension. Aortic Atherosclerosis (ICD10-I70.0). Electronically Signed   By: Donavan Foil M.D.   On: 03/19/2017 22:22   Dg Chest Port 1 View  Result Date: 03/21/2017 CLINICAL DATA:  Respiratory distress. EXAM: PORTABLE CHEST 1 VIEW COMPARISON:  03/20/2017.  CT 03/19/2017. FINDINGS: Mediastinum stable. Heart size stable. Bilateral perihilar basilar infiltrates, left side greater right again noted. Findings suggest pneumonia. Ground-glass density in the left upper lobe noted on prior CT of of 03/19/2017 this faintly visualized. No pneumothorax. Surgical clips noted over the chest and neck. Carotid vascular calcification. Degenerative changes both shoulders. Degenerative changes thoracic spine. Old left rib fractures noted. IMPRESSION: 1. Persistent bilateral perihilar and basilar infiltrates, left side greater than right. Findings consist with pneumonia. No interim change. 2. Ground-glass density in the left mid lung on  prior CT of 03/19/2017 faintly visualized on today's chest x-ray. Again this is also most likely secondary to pneumonia. Chest is unchanged from prior exam. 3. Carotid vascular disease. Electronically Signed   By: Marcello Moores  Register   On: 03/21/2017 06:24   Dg Chest Port 1 View  Result Date: 03/20/2017 CLINICAL DATA:  Acute respiratory distress. EXAM: PORTABLE CHEST 1 VIEW COMPARISON:  Radiographs and CTA yesterday. FINDINGS: Low lung volumes persist. Atherosclerosis of the aortic arch. Linear atelectasis in the right midlung versus fluid in the fissure. Worsening diffuse hazy opacities suspicious for pulmonary edema. Ground-glass opacity in the left upper lobe on CT is only faintly visualized. No large pleural effusion. No pneumothorax. IMPRESSION: Low lung volumes. Worsening diffuse hazy opacities suspicious for increasing pulmonary edema. Ground-glass opacity in the left upper lobe on CT is only faintly visualized radiographically. Electronically Signed  By: Jeb Levering M.D.   On: 03/20/2017 03:30   Dg Chest Portable 1 View  Result Date: 03/19/2017 CLINICAL DATA:  Shortness of breath EXAM: PORTABLE CHEST 1 VIEW COMPARISON:  07/13/2010 FINDINGS: Cardiac shadow is stable. Patient is somewhat rotated to the right accentuating the mediastinum markings. Lungs are well aerated bilaterally. Minimal right basilar atelectasis is seen. Old rib fractures are noted on the left and stable. IMPRESSION: Minimal right basilar atelectasis.  No acute abnormality noted. Electronically Signed   By: Inez Catalina M.D.   On: 03/19/2017 13:34    Cardiac Studies   TTE: pending   Patient Profile     76 y.o. female with PMH of hemorrhagic stroke, GERD, DM, HTN, HL and arthritis who presented to the ED with dyspnea and called a Code Sepsis in the ED. Developed Afib RVR.   Assessment & Plan    1. Atrial fibrillation / atrial flutter - per EKG 03/20/17, telemetry shows converted to SR about 330am and appears to have  sustained since that time. Initially started on lopressor 25mg  BID but increased to 37.5mg  BID with blood pressure tolerating. Suspect this is 2/2 underlying illnesses this admission.  - This patients CHA2DS2-VASc Score and unadjusted Ischemic Stroke Rate (% per year) is equal to 11.2 % stroke rate/year from a score of 7 (HTN, DM, age, female, stroke). Not currently on [facility] given hx of hemorrhagic stroke. MRI pending.  - consider neurology for input for anticoagulation AND appropriateness for NOAC (xarelto, eliquis) given her stroke history pending MRI.  2. HTN - continue current therapy  3. DM: Hgb A1c 6.6 - per primary team  4. UTI - per primary team, on ABX  5. Dyspnea: Has had several episodes of sudden onset of respiratory distress. CXR this morning showed continued bilateral infiltrates consistent with PNA. BNP mildly elevated on admission.  -- echo pending  Signed, Reino Bellis, NP  03/21/2017, 12:02 PM     I have seen, examined and evaluated the patient this PM along with Reino Bellis, NP - she was not in her room for some time because of her MRI. I'm seeing her later on this evening..  After reviewing all the available data and chart, we discussed the patients laboratory, study & physical findings as well as symptoms in detail. I agree with her findings, examination as well as impression recommendations as per our discussion.    She has relatively significant diffuse rales on exam as well as some rhonchi.  Thankfully, she has converted from atrial fibrillation into sinus rhythm with PVCs on the monitor currently. It does appear on her chest x-rays that she is developing full-blown pneumonia disease be bilateral. I'm also concerned that there is possible signs of congestive heart failure. Her 2-D echocardiogram was read earlier today is showing normal ejection fraction, but has likely grade 3 diastolic dysfunction. With her atrial fibrillation tachycardia, I'm concerned that  she is also having, getting features of of CHF making her breathing worse. She did diurese some with IV Lasix today,    I will give her another dose evening and right for 2 doses tomorrow. To reassess.  She is also borderline hypotensive and have cut her losartan dose in half and discontinued HCTZ for now.  I would preferentially decreased dose of amlodipine versus beta blocker to allow for adequate heart rate control.  This is a difficult situation would like her heart rate to slow down, but she probably has some compensatory tachycardia in the setting of  ongoing pneumonia.  Target heart rate range below 100, preferably closer to 90.  Discussed briefly with Dr. Andria Frames, but had not yet seen and examined her mood and very concerned about her lung exam.   Glenetta Hew, M.D., M.S. Interventional Cardiologist   Pager # [phone] Phone # [phone] [address] [place], [place] [address]

## 2017-03-21 NOTE — Progress Notes (Signed)
Patient noted to be SOB, wheezing, and sats in 70s-low 80s% this morning.  Coached patient to cough and deep breathe and sat her up in the bed.  Got her for brief moments into the 90s%, but wheezing heavily and struggling to breathe with loss of color in her face.  RT arrived to give breathing treatment, but sats still low afterward and still struggling. Rapid Response RN called to assess patient and possibly initiate BIPAP.  Chest x-ray ordered and await results.  Will call MD with f/u info.  Karen Jackson Patient

## 2017-03-21 NOTE — Significant Event (Signed)
Rapid Response Event Note  Overview: Time Called: 0530 Arrival Time: 0535 Event Type: Respiratory  Initial Focused Assessment:  Called by bedside RN for respiratory distress.  Pt reportedly received a duoneb PTMA with no relief.  Audible wheezing noted, pt in obvious distress with tachypnea and accessory muscle use.  Lung sounds were very diminished bilaterally with expiratory wheezing.  Pt did not appear to be moving much air, breaths were shallow and rapid. Pt remained AAO x 4 throughout encounter.  SpO2 88% on 4L [place], RR 30/min.      3 + edema to LLE, 2 + edema to RLE.   Last documented lasix administration was 40mg  on 7/24 at 0455.    Bipap was placed by RT with much improvement.  Pt reports relief from distress, HR decreased from 118 to low 100s ST.  SpO2 now 96% on 60% FIO2.    VS as follows:  HR 105 ST, BP 107/50, 96% O2, RR 24.    PCXR ordered.  Spoke with Dr. Brett Albino with family med, MD enroute to bedside.      Outcome: Stayed in room and stabalized     , 

## 2017-03-21 NOTE — Progress Notes (Signed)
  Echocardiogram 2D Echocardiogram has been performed.  Donata Clay 03/21/2017, 11:38 AM

## 2017-03-21 NOTE — Evaluation (Signed)
Physical Therapy Evaluation Patient Details Name: Karen Jackson MRN: [id number] DOB: [birth year] Today's Date: 03/21/2017   History of Present Illness  Pt is a 76 y/o female admitted secondary to cough and SOB. Pt with worsening respiratory function and went into acute respiratory failure requiring supplemental O2 and BiPAP. Pt now on 4L of O2 via [place]. Pt also with new onset a-fib with RVR. PMH including but not limited to HTN, DM, and hx of hemorrhagic CVA with residual L sided weakness.  Clinical Impression  Pt presented supine in bed with HOB elevated, awake and willing to participate in therapy session. Prior to admission, pt reported that she ambulated within her home with RW, used a w/c for community distances and requires assistance for ADLs. Pt has caregivers with her intermittently throughout the day but is alone at night. Pt currently requires mod A x2 for bed mobility, min A x2 for safety with transfers and is very limited in mobility secondary to fatigue and increasing supplemental O2 needs. Pt on 4L initially and was titrated up to 6L by therapist during activity. Pt's SPO2 decreased to as low as 83% and required sitting rest break of several minutes with instruction in pursed-lip breathing to return to high 80s to low 90's. Pt would continue to benefit from skilled physical therapy services at this time while admitted and after d/c to address the below listed limitations in order to improve overall safety and independence with functional mobility.     Follow Up Recommendations SNF;Other (comment) (potential to d/c home pending mobility progression)    Equipment Recommendations  None recommended by PT    Recommendations for Other Services       Precautions / Restrictions Precautions Precautions: Fall Precaution Comments: watch SPO2 Restrictions Weight Bearing Restrictions: No      Mobility  Bed Mobility Overal bed mobility: Needs Assistance Bed Mobility: Supine to Sit;Sit to  Supine     Supine to sit: Mod assist;+2 for physical assistance;HOB elevated Sit to supine: Min assist   General bed mobility comments: increased time and effort, use of bed rails, assist at bilateral LEs and trunk to achieve sitting EOB with use of bed pads to position hips.   Transfers Overall transfer level: Needs assistance Equipment used: Rolling walker (2 wheeled) Transfers: Sit to/from Stand Sit to Stand: Min assist;+2 safety/equipment         General transfer comment: increased time and effort, pt pulling up with bilateral UEs on RW, physical assist provided for pt to power into standing and for stability. pt performed sit<>stand from EOB x2.  Ambulation/Gait             General Gait Details: pt able to take a few side steps at EOB with min A x2 for safety; pt fatiguing very quickly and SPO2 decreasing to as low as 83% on 6L  Stairs            Wheelchair Mobility    Modified Rankin (Stroke Patients Only)       Balance Overall balance assessment: Needs assistance Sitting-balance support: Feet supported Sitting balance-Leahy Scale: Fair Sitting balance - Comments: pt able to sit EOB with supervision   Standing balance support: During functional activity;Bilateral upper extremity supported Standing balance-Leahy Scale: Poor Standing balance comment: pt reliant on bilateral UEs on RW                             Pertinent Vitals/Pain Pain Assessment:  No/denies pain    Home Living Family/patient expects to be discharged to:: Private residence Living Arrangements: Alone Available Help at Discharge: Personal care attendant;Available PRN/intermittently Type of Home: House Home Access: Ramped entrance     Home Layout: One level Home Equipment: Walker - 2 wheels;Shower seat;Wheelchair - manual      Prior Function Level of Independence: Needs assistance   Gait / Transfers Assistance Needed: ambulates with RW inside home, uses a w/c for  community distances  ADL's / Homemaking Assistance Needed: requires assistance with ADLs        Hand Dominance        Extremity/Trunk Assessment   Upper Extremity Assessment Upper Extremity Assessment: Defer to OT evaluation    Lower Extremity Assessment Lower Extremity Assessment: Generalized weakness;LLE deficits/detail LLE Deficits / Details: pt with residual weakness from an old CVA; MMT revealed 3/5 for knee extension, knee flexion, hip flexion, and ankle DF       Communication   Communication: No difficulties  Cognition Arousal/Alertness: Awake/alert Behavior During Therapy: WFL for tasks assessed/performed Overall Cognitive Status: Within Functional Limits for tasks assessed                                        General Comments      Exercises     Assessment/Plan    PT Assessment Patient needs continued PT services  PT Problem List Decreased strength;Decreased activity tolerance;Decreased balance;Decreased mobility;Decreased coordination;Cardiopulmonary status limiting activity       PT Treatment Interventions DME instruction;Gait training;Stair training;Functional mobility training;Therapeutic activities;Therapeutic exercise;Balance training;Neuromuscular re-education;Patient/family education    PT Goals (Current goals can be found in the Care Plan section)  Acute Rehab PT Goals Patient Stated Goal: to return to PLOF PT Goal Formulation: With patient Time For Goal Achievement: 04/04/17 Potential to Achieve Goals: Good    Frequency Min 3X/week   Barriers to discharge        Co-evaluation PT/OT/SLP Co-Evaluation/Treatment: Yes Reason for Co-Treatment: For patient/therapist safety;To address functional/ADL transfers PT goals addressed during session: Mobility/safety with mobility;Balance;Proper use of DME;Strengthening/ROM         AM-PAC PT "6 Clicks" Daily Activity  Outcome Measure Difficulty turning over in bed (including  adjusting bedclothes, sheets and blankets)?: Total Difficulty moving from lying on back to sitting on the side of the bed? : Total Difficulty sitting down on and standing up from a chair with arms (e.g., wheelchair, bedside commode, etc,.)?: Total Help needed moving to and from a bed to chair (including a wheelchair)?: A Lot Help needed walking in hospital room?: A Lot Help needed climbing 3-5 steps with a railing? : Total 6 Click Score: 8    End of Session Equipment Utilized During Treatment: Gait belt;Oxygen (4-6L) Activity Tolerance: Patient limited by fatigue Patient left: in bed;with call bell/phone within reach Nurse Communication: Mobility status PT Visit Diagnosis: Other abnormalities of gait and mobility (R26.89)    Time: 3716-9678 PT Time Calculation (min) (ACUTE ONLY): 29 min   Charges:   PT Evaluation $PT Eval Moderate Complexity: 1 Procedure     PT G Codes:        Sherie Don, PT, DPT [place] 03/21/2017, 2:29 PM

## 2017-03-21 NOTE — Progress Notes (Signed)
On arrival noted patient belly breathing with some compromising respiratory function. Noted SATs in the mid 80's with some increase respirations in the upper 20's low 30's. Spoke to patient regarding my concerns of her respiratory status  Pt placed on BIPAP QHS for the night. Pt is tolerating it well at this time. immediately SATs improved and are now stable. Pt appears much more comfortable on the BiPAP machine.

## 2017-03-21 NOTE — Progress Notes (Signed)
Came by to see Ms. Cannady this morning with Dr. Brett Albino. Rapid response was called on patient this morning when nurse noticed patient was desatting. Patient was on 4L O2 via nasal canula and nurse noted patient had O2 sats of 78% around 5am. Nurse called respiratory and duoneb breathing treatment was administered. Patient continued to sat in the 80% and rapid response was called. Rapid response team placed patient on bipap and ordered a repeat CXR. On exam patient had expiratory wheezes but no crackles were auscultated. 1+ Pitting edema noted in legs bilaterally. CXR showed haziness in left lower lobe, with decreased visibility of left costophrenic angle.  -lasix 40 mg IV -continue bipap -monitor respiratory status -continuous pulse ox  Caroline More, DO, PGY-1 [facility] 03/21/2017 6:51 AM

## 2017-03-21 NOTE — Progress Notes (Signed)
Family Medicine Teaching Service Daily Progress Note Intern Pager: [phone]  Patient name: Karen Jackson Medical record number: [id number] Date of birth: 1941/03/24 Age: 76 y.o. Gender: female  Primary Care Provider: Zenia Resides, MD Consultants: none Code Status: FULL  Pt Overview and Major Events to Date:  Admitted to telemetry on 7/23.  Rapid response called twice during the night of 7/23 due to patient's desaturations.  Newly discovered atrial fibrillation with RVR on the night of 7/23.  Febrile to 101.9 on 7/24, responded to tylenol.  Rapid response again called on morning of 7/25 for desaturation, patient responded well to BiPAP.  Assessment and Plan: Karen Jackson is a 76 y.o. female presenting with cough and SOB of five days' duration. PMH is significant for hemorrhagic stroke in 2013 with residual left sided weakness, HTN, HLD, Type 2 DM, hypomagnesemia, urinary frequency, GERD, and OA.  SOB:  On arrival meeting sepsis criteria with fever to 103F, tachycardia and tachypnea.  DDx includes PNA, PE, CHF.  Patient reports SOB with cough and congestion since 7/20.  She also noticed some left leg pain and swelling that prevented her from bearing weight.  She presented to clinic today and desatted to 87%, which increased to 92% when given 2L O2.  She is not on O2 at home and this is a new requirement for her.  She was transferred to the ED and desatted again there, so she was put on 4L O2 with SpO2 in the mid-90's.  WBC 10.4, LA 1.27 and febrile to 103F in the ED.  Given 1L NS bolus x 5, albuterol, and ipratropium in the ED. UA hazy with many bacteria, moderate leuks and positive for nitrite.  She denied dysuria however endorsing increased frequency of urination over last 2 weeks.  She was started on CTX and azithromycin in the ED.  Treating for possible pneumonia and UTI given fever however CXR negative for infiltrate.  LE venous dopplers in ED negative for DVT and initial troponin also  negative.  Procalcitonin negative.  Troponins 0.03 --> 0.04 --> 0.05.  BNP 241.  Blood cx negative x 1 day. -cardiology consulted -continuous cardiac monitoring  -continue azithromycin and ceftriaxone -daily CBC, BMET -BiPAP 7/25 am, transitioned to supplemental O2 > 4L per [place], will wean as tolerated  -Tylenol PRN  -continuous pulse ox -vitals per unit routine   New-onset atrial fibrillation with RVR: EKG overnight on 7/23 showed atrial fibrillation with RVR, rate into 130s.  Patient was asymptomatic, with no chest pain or palpitations at the time.  Was given metoprolol 25 mg and was in sinus rhythm 7/24 am.  A fib could be a result of new infection or could be independent.  CHA2DS2-VASc Score = 7 (11.2% stroke rate/year) -cardiology following -echo on 7/25 -increased metoprolol to 37.5 mg BID -will give prophylactic dose of Lovenox due to patient's hx of hemorrhagic stroke and fall risk; will obtain noncontrast MRI to assess area of past stroke and reassess Lovenox dosing   Hx of hemorrhagic stroke:  Left arm and leg are largely immobile, and patient reports increased swelling in the left leg and foot.  She is not currently on anticoagulation due to hemorrhagic stroke.  -holding cozaar-HCTZ in setting of hypotension -continue gabapentin 100 mg TID -brain MRI 7/25 to evaluate old region of stroke and decide on anticoagulation (will discuss with neurology)   Urinary frequency 2/2 UTI: Has been going to the bathroom hourly during the night over the past two weeks.  She says this is worse than her usual nighttime urinary frequency.  Denies dysuria.  UA positive for bacteria, nitrite, leukocyte esterase.  Urine cx negative -continue ceftriaxone  -monitor UOP   HTN:  BP 124/61 on admission.  Takes amlodipine 10 mg daily and losartan-HCTZ 100-12.5 mg daily.  BP 107/50 on 7/25 am -holding cozaar-HCTZ -monitor BPs    HLD:  Takes Lipitor 20 mg daily -continue home Lipitor   Type 2  DM:  Last A1C in April was 6.6.  Does not take medications for diabetes at home. -Recheck A1c  -sensitive SSI   GERD:  Takes Prilosec 20 mg at home -Protonix 40 mg daily   Hypomagnesemia.  At home on 400 mg po daily -Continue home Mag    OA:  Stable. Takes Celebrex 200 mg at home -continue home Celebrex  FEN/GI: carb modified, protonix Prophylaxis: Lovenox  Disposition: Home  Subjective:  Karen Jackson is on BiPAP this morning and is satting in the mid-90s.  She no longer feels short of breath and denies chest pain or palpitations.  She would like to be transitioned off of BiPAP.  Objective: Temp:  [97.8 F (36.6 C)-101.9 F (38.8 C)] 97.8 F (36.6 C) (07/25 0646) Pulse Rate:  [78-111] 94 (07/25 0646) Resp:  [18-33] 21 (07/25 0646) BP: (91-118)/(50-101) 107/50 (07/25 0550) SpO2:  [77 %-97 %] 95 % (07/25 0646) FiO2 (%):  [60 %] 60 % (07/25 0606) Physical Exam: General: lying in bed comfortably on bipap, no increased work of breathing Cardiovascular: sinus rhythm, normal rate, no MRG Respiratory: CTAB, no crackles or wheezing heard Abdomen: nontender to palpation, +bowel sounds Extremities: bilateral non-pitting edema in lower extremities  Laboratory:  Recent Labs Lab 03/19/17 1244 03/20/17 0645 03/21/17 0418  WBC 10.4 15.3* 12.6*  HGB 15.7* 13.1 13.4  HCT 45.8 39.5 39.4  PLT 215 241 221    Recent Labs Lab 03/19/17 1244 03/20/17 0645 03/21/17 0418  NA 131* 133* 132*  K 3.4* 3.1* 4.1  CL 90* 95* 95*  CO2 29 25 27   BUN 9 9 15   CREATININE 0.68 0.70 0.66  CALCIUM 9.4 8.1* 8.2*  PROT 7.8  --   --   BILITOT 1.0  --   --   ALKPHOS 61  --   --   ALT 17  --   --   AST 28  --   --   GLUCOSE 185* 164* 150*     Imaging/Diagnostic Tests: No results found.   Kathrene Alu, MD 03/21/2017, 6:51 AM PGY-1, [place] Intern pager: [phone], text pages welcome

## 2017-03-22 DIAGNOSIS — E876 Hypokalemia: Secondary | ICD-10-CM

## 2017-03-22 LAB — BASIC METABOLIC PANEL
ANION GAP: 9 (ref 5–15)
BUN: 11 mg/dL (ref 6–20)
CHLORIDE: 94 mmol/L — AB (ref 101–111)
CO2: 31 mmol/L (ref 22–32)
Calcium: 7.9 mg/dL — ABNORMAL LOW (ref 8.9–10.3)
Creatinine, Ser: 0.64 mg/dL (ref 0.44–1.00)
Glucose, Bld: 120 mg/dL — ABNORMAL HIGH (ref 65–99)
POTASSIUM: 2.9 mmol/L — AB (ref 3.5–5.1)
SODIUM: 134 mmol/L — AB (ref 135–145)

## 2017-03-22 LAB — GLUCOSE, CAPILLARY
GLUCOSE-CAPILLARY: 225 mg/dL — AB (ref 65–99)
Glucose-Capillary: 112 mg/dL — ABNORMAL HIGH (ref 65–99)
Glucose-Capillary: 151 mg/dL — ABNORMAL HIGH (ref 65–99)
Glucose-Capillary: 113 mg/dL — ABNORMAL HIGH (ref 65–99)

## 2017-03-22 LAB — CBC
HCT: 36.4 % (ref 36.0–46.0)
HEMOGLOBIN: 12.2 g/dL (ref 12.0–15.0)
MCH: 32.4 pg (ref 26.0–34.0)
MCHC: 33.5 g/dL (ref 30.0–36.0)
MCV: 96.8 fL (ref 78.0–100.0)
PLATELETS: 234 10*3/uL (ref 150–400)
RBC: 3.76 MIL/uL — AB (ref 3.87–5.11)
RDW: 12.8 % (ref 11.5–15.5)
WBC: 9.6 10*3/uL (ref 4.0–10.5)

## 2017-03-22 MED ORDER — METOPROLOL TARTRATE 50 MG PO TABS: 50.0000 mg | ORAL_TABLET | Freq: Two times a day (BID) | ORAL | Status: DC

## 2017-03-22 MED ORDER — POTASSIUM CHLORIDE CRYS ER 20 MEQ PO TBCR
40.0000 meq | EXTENDED_RELEASE_TABLET | Freq: Two times a day (BID) | ORAL | Status: AC
  Administered 2017-03-22 – 2017-03-23 (×4): 40 meq via ORAL
  Filled 2017-03-22 (×4): qty 2

## 2017-03-22 MED ORDER — POTASSIUM CHLORIDE CRYS ER 20 MEQ PO TBCR
40.0000 meq | EXTENDED_RELEASE_TABLET | Freq: Once | ORAL | Status: AC
  Administered 2017-03-22: 40 meq via ORAL
  Filled 2017-03-22: qty 2

## 2017-03-22 MED ORDER — METOPROLOL TARTRATE 50 MG PO TABS
50.0000 mg | ORAL_TABLET | Freq: Two times a day (BID) | ORAL | Status: DC
  Administered 2017-03-22 – 2017-03-28 (×12): 50 mg via ORAL
  Filled 2017-03-22 (×12): qty 1

## 2017-03-22 MED ORDER — ORAL CARE MOUTH RINSE
15.0000 mL | Freq: Two times a day (BID) | OROMUCOSAL | Status: DC
  Administered 2017-03-23 – 2017-03-28 (×7): 15 mL via OROMUCOSAL

## 2017-03-22 MED ORDER — ASPIRIN EC 81 MG PO TBEC
81.0000 mg | DELAYED_RELEASE_TABLET | Freq: Every day | ORAL | Status: DC
  Administered 2017-03-22 – 2017-03-28 (×7): 81 mg via ORAL
  Filled 2017-03-22 (×7): qty 1

## 2017-03-22 MED ORDER — IPRATROPIUM-ALBUTEROL 0.5-2.5 (3) MG/3ML IN SOLN
3.0000 mL | Freq: Four times a day (QID) | RESPIRATORY_TRACT | Status: DC
  Administered 2017-03-22 – 2017-03-24 (×9): 3 mL via RESPIRATORY_TRACT
  Filled 2017-03-22 (×9): qty 3

## 2017-03-22 MED ORDER — METOPROLOL TARTRATE 5 MG/5ML IV SOLN
5.0000 mg | Freq: Once | INTRAVENOUS | Status: DC
  Filled 2017-03-22 (×3): qty 5

## 2017-03-22 NOTE — Progress Notes (Signed)
Progress Note  Patient Name: Karen Jackson Date of Encounter: 03/22/2017  Primary Cardiologist: Ellyn Hack  Subjective   Rapid response called last night placed on BiPAP. - IV Lasix started yesterday with brisk output Feels that her breathing is slightly better today. Back in Afib this morning.   Inpatient Medications    Scheduled Meds: . amLODipine  10 mg Oral Daily  . atorvastatin  20 mg Oral Daily  . celecoxib  200 mg Oral Daily  . enoxaparin (LOVENOX) injection  40 mg Subcutaneous Q24H  . furosemide  40 mg Intravenous BID  . gabapentin  100 mg Oral TID  . insulin aspart  0-9 Units Subcutaneous TID WC  . ipratropium-albuterol  3 mL Nebulization Q4H  . losartan  50 mg Oral Daily  . magnesium oxide  400 mg Oral Daily  . mouth rinse  15 mL Mouth Rinse BID  . metoprolol tartrate  37.5 mg Oral BID  . pantoprazole  40 mg Oral Daily  . potassium chloride  40 mEq Oral Once   Continuous Infusions: . azithromycin Stopped (03/21/17 1644)  . cefTRIAXone (ROCEPHIN)  IV 1 g (03/21/17 1743)   PRN Meds: acetaminophen **OR** acetaminophen, ipratropium-albuterol, metoprolol tartrate, promethazine, traZODone   Vital Signs    Vitals:   03/22/17 0003 03/22/17 0355 03/22/17 0423 03/22/17 0754  BP: (!) 92/47 117/64    Pulse: 90 83 79   Resp: (!) 22 19 18    Temp: (!) 102 F (38.9 C) 98.6 F (37 C)    TempSrc: Axillary Axillary    SpO2: 95% 92% 93% 95%  Weight:  191 lb 5.8 oz (86.8 kg)      Intake/Output Summary (Last 24 hours) at 03/22/17 0900 Last data filed at 03/22/17 0751  Gross per 24 hour  Intake              240 ml  Output             1900 ml  Net            -1660 ml   Filed Weights   03/22/17 0355  Weight: 191 lb 5.8 oz (86.8 kg)    Telemetry    Afib, rates 100s - Personally Reviewed  ECG    N/A - Personally Reviewed  Physical Exam   General: Older W female appearing in no acute distress, but still dyspneic with conversation.  Head: Normocephalic,  atraumatic.  Neck: Supple without bruits, JVD. Lungs:  Resp regular but mildly labored. Diffusely diminished with rales &rhonchi . Heart: Tachy, Irreg Irreg, S1, S2, no S3, S4, or murmur; no rub. Abdomen: Soft, non-tender, non-distended with normoactive bowel sounds. No hepatomegaly. No rebound/guarding. No obvious abdominal masses. Extremities: No clubbing, cyanosis, 1+ LE edema. Distal pedal pulses are 2+ bilaterally. Neuro: Alert and oriented X 3. Moves all extremities spontaneously. Psych: Normal affect.  Labs    Chemistry Recent Labs Lab 03/19/17 1244 03/20/17 0645 03/21/17 0418 03/22/17 0407  NA 131* 133* 132* 134*  K 3.4* 3.1* 4.1 2.9*  CL 90* 95* 95* 94*  CO2 29 25 27 31   GLUCOSE 185* 164* 150* 120*  BUN 9 9 15 11   CREATININE 0.68 0.70 0.66 0.64  CALCIUM 9.4 8.1* 8.2* 7.9*  PROT 7.8  --   --   --   ALBUMIN 4.2  --   --   --   AST 28  --   --   --   ALT 17  --   --   --  ALKPHOS 61  --   --   --   BILITOT 1.0  --   --   --   GFRNONAA >60 >60 >60 >60  GFRAA >60 >60 >60 >60  ANIONGAP 12 13 10 9      Hematology Recent Labs Lab 03/20/17 0645 03/21/17 0418 03/22/17 0407  WBC 15.3* 12.6* 9.6  RBC 4.07 4.02 3.76*  HGB 13.1 13.4 12.2  HCT 39.5 39.4 36.4  MCV 97.1 98.0 96.8  MCH 32.2 33.3 32.4  MCHC 33.2 34.0 33.5  RDW 12.8 12.9 12.8  PLT 241 221 234    Cardiac Enzymes Recent Labs Lab 03/19/17 1846 03/20/17 0134 03/20/17 0645  TROPONINI 0.03* 0.04* 0.05*   No results for input(s): TROPIPOC in the last 168 hours.   BNP Recent Labs Lab 03/19/17 1846  BNP 241.1*     DDimer  Recent Labs Lab 03/19/17 1846  DDIMER 0.77*      Radiology    Mr Brain Wo Contrast  Result Date: 03/21/2017 CLINICAL DATA:  Dyspnea. Code sepsis. Personal history of stroke with left-sided deficits. EXAM: MRI HEAD WITHOUT CONTRAST TECHNIQUE: Multiplanar, multiecho pulse sequences of the brain and surrounding structures were obtained without intravenous contrast.  COMPARISON:  None. FINDINGS: Brain: The diffusion-weighted images demonstrate no acute or subacute infarction. A remote hemorrhagic infarct is noted in the posterior limb of the right internal capsule and lateral thalamus along the cortical spinal tracts. Moderate atrophy and white matter disease is present bilaterally. Subcortical white matter changes are asymmetric on the right subjacent to the precentral gyrus. Internal auditory canals are within normal limits bilaterally. The brainstem and cerebellum are normal. Vascular: Flow is present in the major intracranial arteries. Skull and upper cervical spine: The skullbase is within normal limits. The craniocervical junction is normal. Slight anterolisthesis is present at C3-4. Marrow signal is normal. Sinuses/Orbits: The paranasal sinuses and mastoid air cells are clear. Bilateral lens replacements are present. Globes and orbits are within normal limits otherwise. IMPRESSION: 1. No acute intracranial abnormality. 2. Remote hemorrhagic stroke involving the posterior limb of the right internal capsule and lateral thalamus. 3. Moderate atrophy and diffuse white matter disease is advanced for age. Electronically Signed   By: San Morelle M.D.   On: 03/21/2017 15:21   Dg Chest Port 1 View  Result Date: 03/21/2017 CLINICAL DATA:  Respiratory distress. EXAM: PORTABLE CHEST 1 VIEW COMPARISON:  03/20/2017.  CT 03/19/2017. FINDINGS: Mediastinum stable. Heart size stable. Bilateral perihilar basilar infiltrates, left side greater right again noted. Findings suggest pneumonia. Ground-glass density in the left upper lobe noted on prior CT of of 03/19/2017 this faintly visualized. No pneumothorax. Surgical clips noted over the chest and neck. Carotid vascular calcification. Degenerative changes both shoulders. Degenerative changes thoracic spine. Old left rib fractures noted. IMPRESSION: 1. Persistent bilateral perihilar and basilar infiltrates, left side greater than  right. Findings consist with pneumonia. No interim change. 2. Ground-glass density in the left mid lung on prior CT of 03/19/2017 faintly visualized on today's chest x-ray. Again this is also most likely secondary to pneumonia. Chest is unchanged from prior exam. 3. Carotid vascular disease. Electronically Signed   By: Marcello Moores  Register   On: 03/21/2017 06:24    Cardiac Studies   TTE: 03/21/17: Normal LV size and function. EF 60-65%. No RWMA. Gr III DD (? Restrictive  - High LV filling pressures).  Patient Profile     76 y.o. female with PMH of hemorrhagic stroke, GERD, DM, HTN, HL and arthritis who  presented to the ED with dyspnea and called a Code Sepsis in the ED. Developed Afib RVR.  Echo suggests Gr 3 DD.   Assessment & Plan    1. Atrial fibrillation / atrial flutter - had converted to SR yesterday, but back in Afib this morning. Rates in the low 100s. Currently on lopressor 37.5mg  BID but blood pressures are soft. Will hold on further titration at this time. Suspect this is 2/2 underlying illnesses this admission.  - This patients CHA2DS2-VASc Score and unadjusted Ischemic Stroke Rate (% per year) is equal to 11.2 % stroke rate/year from a score of 7(HTN, DM, age, female, stroke). Not currently on [facility] given hx of hemorrhagic stroke. MRI pending.  - consider neurology for input for anticoagulation AND appropriateness for NOAC (xarelto, eliquis) given her stroke history. (primary Attending to discuss with Neurology)  As she is now tachycardic - will increase BB dose to 50 mg bid & give 1 x IV dose 5mg  Lopressor  If Potassium & Mag levels are stable, could consider digoxin for rate control   Reluctant to use Amiodarone (unless she decompensates) until we have an answer re: anticoagulation.  2. Acute diastolic HF: Echo yesterday reports G3DD with normal EF and no WMA. Started on IV lasix yesterday with good UOP 1.3L.  -- continue for today, Cr stable -- daily weights  3. HTN: -  medications adjusted yesterday to allow for BB therapy - continue current therapy  3. DM: Hgb A1c 6.6 - per primary team  4. UTI/CAP: - per primary team, on ABX  5. Hypokalemia: Replete - ordered KCL 40 mEq bid x 4 doses (would prefer IV K as level is <3, but not available). - precludes use of Digoxin for rate control  Signed, Reino Bellis, NP  03/22/2017, 9:00 AM    I have seen, examined and evaluated the patient this AM along with Reino Bellis, NP-C.  After reviewing all the available data and chart, we discussed the patients laboratory, study & physical findings as well as symptoms in detail. I agree with her findings, examination as well as impression recommendations as per our discussion.    Attending adjustments noted in italics.   She seems actually better today, but had another rough night requiring BiPAP. At this point I think he would just differentially have her on prophylactic BiPAP overnight until she stabilizes.  Clearly she is having and diastolic heart failure component exacerbating her pneumonia. She diuresed well last night, continue IV diuretic today and reassess tomorrow.  With diastolic dysfunction, she needs better rate control of her A. fib. Increasing beta blocker for now.   Glenetta Hew, M.D., M.S. Interventional Cardiologist   Pager # [phone] Phone # [phone] [address] [place], [place] [address]

## 2017-03-22 NOTE — Progress Notes (Signed)
Family Medicine Teaching Service Daily Progress Note Intern Pager: [phone]  Patient name: Karen Jackson Medical record number: [id number] Date of birth: June 10, 1941 Age: 76 y.o. Gender: female  Primary Care Provider: Zenia Resides, MD Consultants: none Code Status: FULL  Pt Overview and Major Events to Date:  Admitted to telemetry on 7/23.  Rapid response called twice during the night of 7/23 due to patient's desaturations.  Newly discovered atrial fibrillation with RVR on the night of 7/23.  Febrile to 101.9 on 7/24, responded to tylenol.  Rapid response again called on morning of 7/25 for desaturation, patient responded well to BiPAP.  Patient required BiPAP overnight on 7/25.  Assessment and Plan: Karen Jackson is a 75 y.o. female presenting with cough and SOB of five days' duration. PMH is significant for hemorrhagic stroke in 2013 with residual left sided weakness, HTN, HLD, Type 2 DM, hypomagnesemia, urinary frequency, GERD, and OA.  SOB:  On arrival meeting sepsis criteria with fever to 103F, tachycardia and tachypnea.  DDx includes PNA, PE, CHF.  Patient reports SOB with cough and congestion since 7/20.  She also noticed some left leg pain and swelling that prevented her from bearing weight.  She presented to clinic today and desatted to 87%, which increased to 92% when given 2L O2.  She is not on O2 at home and this is a new requirement for her.  She was transferred to the ED and desatted again there, so she was put on 4L O2 with SpO2 in the mid-90's.  WBC 10.4, LA 1.27 and febrile to 103F in the ED.  Given 1L NS bolus x 5, albuterol, and ipratropium in the ED. UA hazy with many bacteria, moderate leuks and positive for nitrite.  She denied dysuria however endorsing increased frequency of urination over last 2 weeks.  She was started on CTX and azithromycin in the ED.  Treating for possible pneumonia and UTI given fever however CXR negative for infiltrate.  LE venous dopplers in ED negative  for DVT and initial troponin also negative.  Procalcitonin negative.  Troponins 0.03 --> 0.04 --> 0.05.  BNP 241.  CXR on 7/25 showed bilateral infiltrates.  Blood cx negative x 2 days.  ECHO showed no systolic dysfunction but grade 3 diastolic dysfunction, which could be contributing to her SOB.   -cardiology recommendations: lasix 40 mg IV once on 7/25 then BID on 7/26 -continuous cardiac monitoring  -continue azithromycin and ceftriaxone -daily CBC, BMET -BiPAP 7/25 night, transitioned to supplemental O2 5L per [place], will wean as tolerated  -Tylenol PRN  -continuous pulse ox -vitals per unit routine -patient wants prescription for [facility] upon discharge if possible   New-onset atrial fibrillation with RVR: EKG overnight on 7/23 showed atrial fibrillation with RVR, rate into 130s.  Patient was asymptomatic, with no chest pain or palpitations at the time.  Was given metoprolol 25 mg and was in sinus rhythm 7/24 am.  A fib could be a result of new infection or could be independent.  CHA2DS2-VASc Score = 7 (11.2% stroke rate/year).  Patient has been in sinus rhythm for last two days. -cardiology following -continue metoprolol to 37.5 mg BID -will decide on anticoagulation after assess brain MRI   Hx of hemorrhagic stroke:  Left arm and leg are largely immobile, and patient reports increased swelling in the left leg and foot.  She is not currently on anticoagulation due to hemorrhagic stroke.  MRI negative for acute intracranial abnormality, positive for remote  hemorrhagic stroke in posterior limb of right internal capsule and lateral thalamus. -continue gabapentin 100 mg TID   Urinary frequency 2/2 UTI: Has been going to the bathroom hourly during the night over the past two weeks.  She says this is worse than her usual nighttime urinary frequency.  Denies dysuria.  UA positive for bacteria, nitrite, leukocyte esterase.  Urine cx negative -continue ceftriaxone  -monitor  UOP   HTN:  BP 124/61 on admission.  Takes amlodipine 10 mg daily and losartan-HCTZ 100-12.5 mg daily.  BP 117/64 on 7/26 am -holding HCTZ, continuing on half dose of losartan -monitor BPs    HLD:  Takes Lipitor 20 mg daily -continue home Lipitor   Type 2 DM:  Last A1C in April was 6.6.  A1C measured during this hospital stay is 6.6 -sensitive SSI   GERD:  Takes Prilosec 20 mg at home -Protonix 40 mg daily   Hypomagnesemia.  At home on 400 mg po daily -Continue home Mag    OA:  Stable. Takes Celebrex 200 mg at home -continue home Celebrex  FEN/GI: carb modified, protonix Prophylaxis: Lovenox  Disposition: Home  Subjective:  Karen Jackson is lying inclined in bed eating breakfast on my arrival.  She is mildly short of breath on 5L O2 via [place].  Able to speak full sentences without running out of breath.  Knowledgeable about her pneumonia diagnosis.  Son was in the room and he reminded her of her new diagnosis of diastolic CHF, and the patient expressed understanding.  Discussed that she will work with PT here and they will assess whether SNF or home health was more appropriate for her depending on how she improves.  Mentioned that she will need a prescription for [facility] is she goes home.  Objective: Temp:  [98.6 F (37 C)-102 F (38.9 C)] 98.6 F (37 C) (07/26 0355) Pulse Rate:  [79-103] 79 (07/26 0423) Resp:  [18-28] 18 (07/26 0423) BP: (92-131)/(47-68) 117/64 (07/26 0355) SpO2:  [87 %-97 %] 93 % (07/26 0423) FiO2 (%):  [60 %] 60 % (07/26 0423) Weight:  [191 lb 5.8 oz (86.8 kg)] 191 lb 5.8 oz (86.8 kg) (07/26 0355) Physical Exam: General: lying in bed slightly short of breath but able to talk easily Cardiovascular: sinus rhythm, normal rate, no MRG Respiratory: expiratory wheezing, no crackles heard Abdomen: nontender to palpation, +bowel sounds Extremities: bilateral non-pitting edema in lower extremities, improved from yesterday  Laboratory:  Recent  Labs Lab 03/20/17 0645 03/21/17 0418 03/22/17 0407  WBC 15.3* 12.6* 9.6  HGB 13.1 13.4 12.2  HCT 39.5 39.4 36.4  PLT 241 221 234    Recent Labs Lab 03/19/17 1244 03/20/17 0645 03/21/17 0418 03/22/17 0407  NA 131* 133* 132* 134*  K 3.4* 3.1* 4.1 2.9*  CL 90* 95* 95* 94*  CO2 29 25 27 31   BUN 9 9 15 11   CREATININE 0.68 0.70 0.66 0.64  CALCIUM 9.4 8.1* 8.2* 7.9*  PROT 7.8  --   --   --   BILITOT 1.0  --   --   --   ALKPHOS 61  --   --   --   ALT 17  --   --   --   AST 28  --   --   --   GLUCOSE 185* 164* 150* 120*     Imaging/Diagnostic Tests: Mr Brain Wo Contrast  Result Date: 03/21/2017 CLINICAL DATA:  Dyspnea. Code sepsis. Personal history of stroke with left-sided deficits.  EXAM: MRI HEAD WITHOUT CONTRAST TECHNIQUE: Multiplanar, multiecho pulse sequences of the brain and surrounding structures were obtained without intravenous contrast. COMPARISON:  None. FINDINGS: Brain: The diffusion-weighted images demonstrate no acute or subacute infarction. A remote hemorrhagic infarct is noted in the posterior limb of the right internal capsule and lateral thalamus along the cortical spinal tracts. Moderate atrophy and white matter disease is present bilaterally. Subcortical white matter changes are asymmetric on the right subjacent to the precentral gyrus. Internal auditory canals are within normal limits bilaterally. The brainstem and cerebellum are normal. Vascular: Flow is present in the major intracranial arteries. Skull and upper cervical spine: The skullbase is within normal limits. The craniocervical junction is normal. Slight anterolisthesis is present at C3-4. Marrow signal is normal. Sinuses/Orbits: The paranasal sinuses and mastoid air cells are clear. Bilateral lens replacements are present. Globes and orbits are within normal limits otherwise. IMPRESSION: 1. No acute intracranial abnormality. 2. Remote hemorrhagic stroke involving the posterior limb of the right internal  capsule and lateral thalamus. 3. Moderate atrophy and diffuse white matter disease is advanced for age. Electronically Signed   By: San Morelle M.D.   On: 03/21/2017 15:21     Kathrene Alu, MD 03/22/2017, 7:28 AM PGY-1, [place] Intern pager: [phone], text pages welcome

## 2017-03-22 NOTE — Clinical Social Work Note (Signed)
Clinical Social Work Assessment  Patient Details  Name: Karen Jackson MRN: [id number] Date of Birth: April 13, 1941  Date of referral:  03/22/17               Reason for consult:  Facility Placement                Permission sought to share information with:  Facility Sport and exercise psychologist, Family Supports Permission granted to share information::  Yes, Verbal Permission Granted  Name::        Agency::  SNFs  Relationship::     Contact Information:     Housing/Transportation Living arrangements for the past 2 months:  Single Family Home Source of Information:  Patient Patient Interpreter Needed:  None Criminal Activity/Legal Involvement Pertinent to Current Situation/Hospitalization:  No - Comment as needed Significant Relationships:  Siblings Lives with:  Self Do you feel safe going back to the place where you live?  Yes Need for family participation in patient care:  No (Coment)  Care giving concerns:  CSW received consult for possible SNF placement at time of discharge. CSW spoke with patient regarding PT recommendation of SNF placement at time of discharge. Patient reported that she lives alone is currently unable to care for patient at their home given patient's current physical needs and fall risk. Patient expressed understanding of PT recommendation and is agreeable to SNF placement at time of discharge. CSW to continue to follow and assist with discharge planning needs.   Social Worker assessment / plan:  CSW spoke with patient concerning possibility of rehab at [facility] before returning home.  Employment status:  Retired Forensic scientist:  Medicare PT Recommendations:  [place] / Referral to community resources:  [place]  Patient/Family's Response to care:  Patient recognizes need for rehab before returning home and is agreeable to a SNF in [place]. Patient reported preference for [facility] or [facility] since she has been  there before. CSW explained we will bring her a list of accepting facilities.   Patient/Family's Understanding of and Emotional Response to Diagnosis, Current Treatment, and Prognosis:  Patient/family is realistic regarding therapy needs and expressed being hopeful for SNF placement. Patient expressed understanding of CSW role and discharge process as well as her medical condition. She would prefer to go home but will do SNF if the MD recommends it. No questions/concerns about plan or treatment.    Emotional Assessment Appearance:  Appears stated age Attitude/Demeanor/Rapport:  Other (Appropriate) Affect (typically observed):  Accepting, Appropriate Orientation:  Oriented to Self, Oriented to Place, Oriented to  Time, Oriented to Situation Alcohol / Substance use:  Not Applicable Psych involvement (Current and /or in the community):  No (Comment)  Discharge Needs  Concerns to be addressed:  Care Coordination Readmission within the last 30 days:  No Current discharge risk:  None Barriers to Discharge:  Continued Medical Work up   [employer], [place] 03/22/2017, 3:05 PM

## 2017-03-22 NOTE — NC FL2 (Signed)
[place] LEVEL OF CARE SCREENING TOOL     IDENTIFICATION  Patient Name: Karen Jackson Birthdate: [birth year] Sex: female Admission Date (Current Location): 03/19/2017  [facility] and [place] Number:  Herbalist and Address:  [facility], [place] [address], [place], [place] [address]      Provider Number: [id number]  Attending Physician Name and Address:  Zenia Resides, MD  Relative Name and Phone Number:       Current Level of Care: Hospital Recommended Level of Care: [place] Prior Approval Number:    Date Approved/Denied:   PASRR Number:   [id number]  Discharge Plan: SNF    Current Diagnoses: Patient Active Problem List   Diagnosis Date Noted  . Respiratory failure ([place])   . Community acquired pneumonia   . Acute diastolic CHF (congestive heart failure) ([place])   . Atrial fibrillation with RVR ([place])   . History of stroke   . Dyspnea 03/19/2017  . Pain and swelling of left lower extremity 03/19/2017  . Shortness of breath 03/19/2017  . Acute cystitis without hematuria   . Fever   . Sepsis ([place])   . Metabolic syndrome 63/14/9702  . Encounter for preventive health examination 02/17/2016  . Morbid obesity ([place]) 06/17/2015  . Fall at home 12/01/2014  . Hypomagnesemia 04/24/2014  . Hemiparesis affecting left side as late effect of cerebrovascular accident ([place]) 04/24/2014  . Nontraumatic cerebral hemorrhage ([place]) 04/30/2012  . Diabetes type 2, controlled ([place]) 03/04/2010  . OSTEOPENIA 01/21/2009  . UNSPECIFIED VITAMIN D DEFICIENCY 11/19/2007  . ESSENTIAL HYPERTENSION, BENIGN 11/19/2007  . HYPERCHOLESTEROLEMIA 10/25/2006  . GASTROESOPHAGEAL REFLUX, NO ESOPHAGITIS 10/25/2006  . DIVERTICULOSIS OF COLON 10/25/2006  . Osteoarthritis 10/25/2006  . CERVICAL SPINE DISORDER, NOS 10/25/2006    Orientation RESPIRATION BLADDER Height & Weight     Self, Time, Situation, Place  O2 (Nasal cannula 5L) Incontinent,  External catheter Weight: 86.8 kg (191 lb 5.8 oz) Height:  4\' 9"  (144.8 cm)  BEHAVIORAL SYMPTOMS/MOOD NEUROLOGICAL BOWEL NUTRITION STATUS      Incontinent Diet  AMBULATORY STATUS COMMUNICATION OF NEEDS Skin   Extensive Assist Verbally Normal                       Personal Care Assistance Level of Assistance  Bathing, Feeding, Dressing Bathing Assistance: Maximum assistance Feeding assistance: Independent Dressing Assistance: Limited assistance     Functional Limitations Info             SPECIAL CARE FACTORS FREQUENCY  PT (By licensed PT), OT (By licensed OT)     PT Frequency: 5x/week OT Frequency: 3x/week            Contractures      Additional Factors Info  Code Status, Allergies, Insulin Sliding Scale Code Status Info: Full Allergies Info: Codeine Phosphate, Simvastatin   Insulin Sliding Scale Info: 3x daily with meals        Current Medications (03/22/2017):  This is the current hospital active medication list Current Facility-Administered Medications  Medication Dose Route Frequency Provider Last Rate Last Dose  . acetaminophen (TYLENOL) tablet 650 mg  650 mg Oral Q6H PRN Lovenia Kim, MD   650 mg at 03/22/17 6378   Or  . acetaminophen (TYLENOL) suppository 650 mg  650 mg Rectal Q6H PRN Lovenia Kim, MD      . aspirin EC tablet 81 mg  81 mg Oral Daily Lovenia Kim, MD   81 mg  at 03/22/17 1359  . atorvastatin (LIPITOR) tablet 20 mg  20 mg Oral Daily Lovenia Kim, MD   20 mg at 03/22/17 0924  . azithromycin (ZITHROMAX) 500 mg in dextrose 5 % 250 mL IVPB  500 mg Intravenous Q24H Rumbarger, Rachel L, RPH 250 mL/hr at 03/22/17 1511 500 mg at 03/22/17 1511  . cefTRIAXone (ROCEPHIN) 1 g in dextrose 5 % 50 mL IVPB  1 g Intravenous Q24H Rumbarger, Valeda Malm, RPH   Stopped at 03/22/17 1429  . celecoxib (CELEBREX) capsule 200 mg  200 mg Oral Daily Lovenia Kim, MD   200 mg at 03/22/17 0924  . enoxaparin (LOVENOX) injection 40 mg  40 mg Subcutaneous Q24H Carlyle Dolly, MD   40 mg at 03/22/17 0925  . gabapentin (NEURONTIN) capsule 100 mg  100 mg Oral TID Lovenia Kim, MD   100 mg at 03/22/17 1512  . insulin aspart (novoLOG) injection 0-9 Units  0-9 Units Subcutaneous TID WC Lovenia Kim, MD   2 Units at 03/22/17 1203  . ipratropium-albuterol (DUONEB) 0.5-2.5 (3) MG/3ML nebulizer solution 3 mL  3 mL Nebulization Q4H PRN Zenia Resides, MD   3 mL at 03/21/17 0523  . ipratropium-albuterol (DUONEB) 0.5-2.5 (3) MG/3ML nebulizer solution 3 mL  3 mL Nebulization Q6H Hensel, Jamal Collin, MD      . losartan (COZAAR) tablet 50 mg  50 mg Oral Daily Leonie Man, MD   50 mg at 03/22/17 0925  . magnesium oxide (MAG-OX) tablet 400 mg  400 mg Oral Daily Lovenia Kim, MD   400 mg at 03/22/17 0925  . MEDLINE mouth rinse  15 mL Mouth Rinse BID Hensel, Jamal Collin, MD      . metoprolol tartrate (LOPRESSOR) injection 2.5 mg  2.5 mg Intravenous Q5 min PRN Verner Mould, MD      . metoprolol tartrate (LOPRESSOR) injection 5 mg  5 mg Intravenous Once Leonie Man, MD      . metoprolol tartrate (LOPRESSOR) tablet 50 mg  50 mg Oral BID Leonie Man, MD      . pantoprazole (PROTONIX) EC tablet 40 mg  40 mg Oral Daily Lovenia Kim, MD   40 mg at 03/22/17 0925  . potassium chloride SA (K-DUR,KLOR-CON) CR tablet 40 mEq  40 mEq Oral BID Leonie Man, MD   40 mEq at 03/22/17 1046  . promethazine (PHENERGAN) tablet 12.5 mg  12.5 mg Oral Q6H PRN Lovenia Kim, MD      . traZODone (DESYREL) tablet 50 mg  50 mg Oral QHS PRN Lovenia Kim, MD         Discharge Medications: Please see discharge summary for a list of discharge medications.  Relevant Imaging Results:  Relevant Lab Results:   Additional Information SSN: [place], [place]

## 2017-03-23 ENCOUNTER — Inpatient Hospital Stay (HOSPITAL_COMMUNITY): Payer: Medicare Other

## 2017-03-23 LAB — CBC
HEMATOCRIT: 39 % (ref 36.0–46.0)
HEMOGLOBIN: 12.5 g/dL (ref 12.0–15.0)
MCH: 31.5 pg (ref 26.0–34.0)
MCHC: 32.1 g/dL (ref 30.0–36.0)
MCV: 98.2 fL (ref 78.0–100.0)
Platelets: 281 10*3/uL (ref 150–400)
RBC: 3.97 MIL/uL (ref 3.87–5.11)
RDW: 12.9 % (ref 11.5–15.5)
WBC: 8.3 10*3/uL (ref 4.0–10.5)

## 2017-03-23 LAB — GLUCOSE, CAPILLARY
GLUCOSE-CAPILLARY: 115 mg/dL — AB (ref 65–99)
GLUCOSE-CAPILLARY: 155 mg/dL — AB (ref 65–99)
Glucose-Capillary: 191 mg/dL — ABNORMAL HIGH (ref 65–99)
Glucose-Capillary: 170 mg/dL — ABNORMAL HIGH (ref 65–99)

## 2017-03-23 LAB — BASIC METABOLIC PANEL
Anion gap: 8 (ref 5–15)
BUN: 16 mg/dL (ref 6–20)
CHLORIDE: 95 mmol/L — AB (ref 101–111)
CO2: 32 mmol/L (ref 22–32)
Calcium: 8.2 mg/dL — ABNORMAL LOW (ref 8.9–10.3)
Creatinine, Ser: 0.61 mg/dL (ref 0.44–1.00)
GFR calc Af Amer: >60 mL/min (ref >60)
GFR calc non Af Amer: >60 mL/min (ref >60)
Glucose, Bld: 124 mg/dL — ABNORMAL HIGH (ref 65–99)
POTASSIUM: 4.5 mmol/L (ref 3.5–5.1)
SODIUM: 135 mmol/L (ref 135–145)

## 2017-03-23 MED ORDER — LOSARTAN POTASSIUM 25 MG PO TABS
25.0000 mg | ORAL_TABLET | Freq: Every day | ORAL | Status: DC
  Filled 2017-03-23: qty 1

## 2017-03-23 MED ORDER — DILTIAZEM HCL 60 MG PO TABS
90.0000 mg | ORAL_TABLET | Freq: Two times a day (BID) | ORAL | Status: DC
  Administered 2017-03-23 – 2017-03-24 (×3): 90 mg via ORAL
  Filled 2017-03-23 (×3): qty 1

## 2017-03-23 MED ORDER — FUROSEMIDE 10 MG/ML IJ SOLN
40.0000 mg | Freq: Every day | INTRAMUSCULAR | Status: DC
  Administered 2017-03-23 – 2017-03-26 (×4): 40 mg via INTRAVENOUS
  Filled 2017-03-23 (×4): qty 4

## 2017-03-23 MED ORDER — DILTIAZEM HCL 100 MG IV SOLR
10.0000 mg | Freq: Once | INTRAVENOUS | Status: AC
  Administered 2017-03-23: 10 mg via INTRAVENOUS
  Filled 2017-03-23: qty 10

## 2017-03-23 NOTE — Progress Notes (Signed)
Clinical Education officer, museum spoke with admissions coordinator Whitney from [facility]. Whitney stated they are able to offer a bed to the patient and accept her once she is medically stable. Facility is also able to take patient over the weekend.   Rhea Pink, MSW,  [place]

## 2017-03-23 NOTE — Care Management Note (Addendum)
Case Management Note Marvetta Gibbons RN, BSN Unit 4E-Case Manager [phone]  Patient Details  Name: Karen Jackson MRN: [id number] Date of Birth: 08-27-1941  Subjective/Objective:  Pt admitted with fever/SOB-PNA and HF, AFIb                  Action/Plan: PTA Pt lived at home alone, per PT/OT evaluations recommendations for SNF- pt agreeable and CSW following for SNF placement- pt has been using bipap at night and HFNC during day- CM will follow for d/c needs- CSW following for SNF needs.   Expected Discharge Date:                  Expected Discharge Plan:  Skilled Nursing Facility  In-House Referral:  Clinical Social Work  Discharge planning Services  CM Consult  Post Acute Care Choice:    Choice offered to:     DME Arranged:    DME Agency:     HH Arranged:    [place] Agency:     Status of Service:  In process, will continue to follow  If discussed at Long Length of Stay Meetings, dates discussed:    Discharge Disposition: skilled facility   Additional Comments:  Dawayne Patricia, RN 03/23/2017, 12:16 PM

## 2017-03-23 NOTE — Progress Notes (Signed)
Progress Note  Patient Name: Karen Jackson Date of Encounter: 03/23/2017  Primary Cardiologist: Ellyn Hack  Subjective   She had kind of a rough night because the BiPAP did not work so well. Remains in A. fib RVR  Inpatient Medications    Scheduled Meds: . aspirin EC  81 mg Oral Daily  . atorvastatin  20 mg Oral Daily  . celecoxib  200 mg Oral Daily  . diltiazem  10 mg Intravenous Once  . diltiazem  90 mg Oral Q12H  . enoxaparin (LOVENOX) injection  40 mg Subcutaneous Q24H  . gabapentin  100 mg Oral TID  . insulin aspart  0-9 Units Subcutaneous TID WC  . ipratropium-albuterol  3 mL Nebulization Q6H  . [START ON 03/24/2017] losartan  25 mg Oral Daily  . magnesium oxide  400 mg Oral Daily  . mouth rinse  15 mL Mouth Rinse BID  . metoprolol tartrate  5 mg Intravenous Once  . metoprolol tartrate  50 mg Oral BID  . pantoprazole  40 mg Oral Daily  . potassium chloride  40 mEq Oral BID   Continuous Infusions: . azithromycin Stopped (03/22/17 1705)  . cefTRIAXone (ROCEPHIN)  IV Stopped (03/22/17 1429)   PRN Meds: acetaminophen **OR** acetaminophen, ipratropium-albuterol, metoprolol tartrate, promethazine, traZODone   Vital Signs    Vitals:   03/22/17 2351 03/23/17 0223 03/23/17 0400 03/23/17 0826  BP: 115/80  (!) 114/53   Pulse: (!) 101 (!) 132 99   Resp: (!) 21 (!) 25 (!) 26   Temp: 98.7 F (37.1 C)  99.1 F (37.3 C)   TempSrc: Oral  Oral   SpO2: 95% 93% 96% 93%  Weight:   191 lb 12.8 oz (87 kg)   Height:        Intake/Output Summary (Last 24 hours) at 03/23/17 0928 Last data filed at 03/23/17 0757  Gross per 24 hour  Intake              240 ml  Output             1775 ml  Net            -1535 ml   Filed Weights   03/22/17 0355 03/23/17 0400  Weight: 191 lb 5.8 oz (86.8 kg) 191 lb 12.8 oz (87 kg)    Telemetry    Afib, rates 100s - Personally Reviewed  ECG    N/A - Personally Reviewed  Physical Exam   Physical Exam  Constitutional: She is oriented to  person, place, and time. She appears well-developed.  Obese elderly woman. Mild distress, but nontoxic. Less dyspneic but still has to catch her breath during conversation  HENT:  Head: Normocephalic and atraumatic.  Mouth/Throat: No oropharyngeal exudate.  Eyes: Pupils are equal, round, and reactive to light. EOM are normal.  Neck: Normal range of motion. Neck supple. No JVD (Unable to assess due to body habitus) present.  Cardiovascular: Normal heart sounds and intact distal pulses.  An irregularly irregular rhythm present. Tachycardia present.  Exam reveals no gallop.   Pulmonary/Chest: No respiratory distress (Increased worker breathing). She has wheezes (Diffuse late expiratory). She has rales (& rhonchi).  Increased worker breathing with diffuse rales and rhonchi. Mild extra wheezing  Abdominal: Soft. Bowel sounds are normal. She exhibits no distension. There is no tenderness. There is no rebound.  Musculoskeletal: Normal range of motion. She exhibits no edema or deformity.  Neurological: She is alert and oriented to person, place, and time. No  cranial nerve deficit.  Skin: Skin is warm and dry. She is not diaphoretic (But very sweaty, warm to touch). No erythema.  Profuse sweating  Psychiatric: She has a normal mood and affect. Her behavior is normal. Judgment and thought content normal.  Nursing note and vitals reviewed.   Labs    Chemistry Recent Labs Lab 03/19/17 1244  03/21/17 0418 03/22/17 0407 03/23/17 0242  NA 131*  < > 132* 134* 135  K 3.4*  < > 4.1 2.9* 4.5  CL 90*  < > 95* 94* 95*  CO2 29  < > 27 31 32  GLUCOSE 185*  < > 150* 120* 124*  BUN 9  < > 15 11 16   CREATININE 0.68  < > 0.66 0.64 0.61  CALCIUM 9.4  < > 8.2* 7.9* 8.2*  PROT 7.8  --   --   --   --   ALBUMIN 4.2  --   --   --   --   AST 28  --   --   --   --   ALT 17  --   --   --   --   ALKPHOS 61  --   --   --   --   BILITOT 1.0  --   --   --   --   GFRNONAA >60  < > >60 >60 >60  GFRAA >60  < > >60  >60 >60  ANIONGAP 12  < > 10 9 8   < > = values in this interval not displayed.   Hematology  Recent Labs Lab 03/21/17 0418 03/22/17 0407 03/23/17 0242  WBC 12.6* 9.6 8.3  RBC 4.02 3.76* 3.97  HGB 13.4 12.2 12.5  HCT 39.4 36.4 39.0  MCV 98.0 96.8 98.2  MCH 33.3 32.4 31.5  MCHC 34.0 33.5 32.1  RDW 12.9 12.8 12.9  PLT 221 234 281    Cardiac Enzymes  Recent Labs Lab 03/19/17 1846 03/20/17 0134 03/20/17 0645  TROPONINI 0.03* 0.04* 0.05*   No results for input(s): TROPIPOC in the last 168 hours.   BNP  Recent Labs Lab 03/19/17 1846  BNP 241.1*     DDimer   Recent Labs Lab 03/19/17 1846  DDIMER 0.77*      Radiology    Mr Brain Wo Contrast  Result Date: 03/21/2017 CLINICAL DATA:  Dyspnea. Code sepsis. Personal history of stroke with left-sided deficits. EXAM: MRI HEAD WITHOUT CONTRAST TECHNIQUE: Multiplanar, multiecho pulse sequences of the brain and surrounding structures were obtained without intravenous contrast. COMPARISON:  None. FINDINGS: Brain: The diffusion-weighted images demonstrate no acute or subacute infarction. A remote hemorrhagic infarct is noted in the posterior limb of the right internal capsule and lateral thalamus along the cortical spinal tracts. Moderate atrophy and white matter disease is present bilaterally. Subcortical white matter changes are asymmetric on the right subjacent to the precentral gyrus. Internal auditory canals are within normal limits bilaterally. The brainstem and cerebellum are normal. Vascular: Flow is present in the major intracranial arteries. Skull and upper cervical spine: The skullbase is within normal limits. The craniocervical junction is normal. Slight anterolisthesis is present at C3-4. Marrow signal is normal. Sinuses/Orbits: The paranasal sinuses and mastoid air cells are clear. Bilateral lens replacements are present. Globes and orbits are within normal limits otherwise. IMPRESSION: 1. No acute intracranial  abnormality. 2. Remote hemorrhagic stroke involving the posterior limb of the right internal capsule and lateral thalamus. 3. Moderate atrophy and diffuse white matter disease is  advanced for age. Electronically Signed   By: San Morelle M.D.   On: 03/21/2017 15:21    Cardiac Studies   TTE: 03/21/17: Normal LV size and function. EF 60-65%. No RWMA. Gr III DD (? Restrictive  - High LV filling pressures).  Patient Profile     76 y.o. female with PMH of hemorrhagic stroke, GERD, DM, HTN, HL and arthritis who presented to the ED with dyspnea and called a Code Sepsis in the ED. Developed Afib RVR.  Echo suggests Gr 3 DD.   Assessment & Plan    1. AFib RVR - Had temporarily gone into sinus rhythm, but now back in A. fib RVR  Increased beta blocker to 50 twice a day yesterday, remains tachycardic  Will give IV diltiazem today and start by mouth diltiazem. - Reducing ARB to allow blood pressure room for calcitonin a - This patients CHA2DS2-VASc Score and unadjusted Ischemic Stroke Rate (% per year) is equal to 11.2 % stroke rate/year from a score of 7(HTN, DM, age, female, stroke). Not currently on [facility] given hx of hemorrhagic stroke. MRI pending. -- > Per neurology input, given her prior hemorrhagic stroke, she is not a candidate for oral anticoagulation  Potassium has been supplemented, would potentially be overuse digoxin   if unable to control rate/rhythm with dual therapy, may need to consider amiodarone (have been reluctant because of the inability to evaluate)   2. Acute diastolic HF: Echo yesterday reports G3DD with normal EF and no WMA. Started on IV lasiday before yesterdayth good UOP 1.3L. And 1.2 L overnight  As she remains in A. fib, she is still at risk for exacerbation of diastolic dysfunction, would continue at least daily Lasix for now as her creatinine remained stable   3. HTN: pressures stable. Reducing losartan dose to allow for beta blocker and calcium channel blocker  for rate control.  3. DM: Hgb A1c 6.6 - per primary team  4. UTI/CAP: - per primary team, on ABX  5. Hypokalemia: Replete and   Signed,   Glenetta Hew, M.D., M.S. Interventional Cardiologist   Pager # [phone] Phone # [phone] [address] [place],  [address]  03/23/2017, 9:28 AM

## 2017-03-23 NOTE — Progress Notes (Signed)
Patient not tolerating Bipap well alarming and patient uncomfortable.  Placed pt on high flow [place] n/c 8 lpm tolerating much better spo2 staying in the 90's

## 2017-03-23 NOTE — Progress Notes (Signed)
Physical Therapy Treatment Patient Details Name: Karen Jackson MRN: [id number] DOB: 02-03-1941 Today's Date: 03/23/2017    History of Present Illness Pt is a 76 y/o female admitted secondary to cough and SOB. Pt with worsening respiratory function and went into acute respiratory failure requiring supplemental O2 and BiPAP. Pt now on 4L of O2 via [place]. Pt also with new onset a-fib with RVR. PMH including but not limited to HTN, DM, and hx of hemorrhagic CVA with residual L sided weakness.    PT Comments    Pt is limited in her mobility by desaturation with movement and as such is making slow progress towards her goals. Pt is modAx2 for bed mobility, and minAx2 for transfers and ambulation of 6 feet with RW. Pt requires skilled PT to progress ambulation training and to improve LE strength and endurance to safely mobilize in her discharge environment.    Follow Up Recommendations  SNF;Other (comment) (potential to d/c home pending mobility progression)     Equipment Recommendations  None recommended by PT       Precautions / Restrictions Precautions Precautions: Fall Precaution Comments: watch SPO2 Restrictions Weight Bearing Restrictions: No    Mobility  Bed Mobility Overal bed mobility: Needs Assistance Bed Mobility: Supine to Sit;Sit to Supine     Supine to sit: Mod assist;+2 for physical assistance;HOB elevated Sit to supine: Min assist   General bed mobility comments: increased time and effort, use of bed rails, assist at bilateral LEs and trunk to achieve sitting EOB with use of bed pads to position hips.   Transfers Overall transfer level: Needs assistance Equipment used: Rolling walker (2 wheeled) Transfers: Sit to/from Stand Sit to Stand: Min assist;+2 safety/equipment         General transfer comment: increased time and effort, pt with sit<>stand from bed to recliner and once more for ambulation of 6 feet. vc for push off from arm  rests  Ambulation/Gait Ambulation/Gait assistance: Min assist;+2 safety/equipment Ambulation Distance (Feet): 6 Feet Assistive device: Rolling walker (2 wheeled) Gait Pattern/deviations: Step-through pattern;Decreased dorsiflexion - left;Decreased step length - left;Decreased weight shift to left;Trunk flexed Gait velocity: slowed Gait velocity interpretation: Below normal speed for age/gender General Gait Details: minAx2 for safety, vc for upright posture, looking out, and walker management         Balance Overall balance assessment: Needs assistance Sitting-balance support: Feet supported Sitting balance-Leahy Scale: Fair Sitting balance - Comments: pt able to sit EOB with supervision   Standing balance support: During functional activity;Bilateral upper extremity supported Standing balance-Leahy Scale: Poor Standing balance comment: pt reliant on bilateral UEs on RW                            Cognition Arousal/Alertness: Awake/alert Behavior During Therapy: WFL for tasks assessed/performed Overall Cognitive Status: Within Functional Limits for tasks assessed                                           General Comments General comments (skin integrity, edema, etc.): Pt on 6LO2/min via HFNC, in supine SaO2 92%O2, with sitting SaO2 88%O2 with ambulation SaO2 dropped to 80%O2 with 4/4 DoE, vc for pursed lipped breathing pt seated and with continued pursed lipped breathing SaO2 back at 89%O2, gait training ended due to pt fatigue       Pertinent Vitals/Pain Pain  Assessment: No/denies pain           PT Goals (current goals can now be found in the care plan section) Acute Rehab PT Goals Patient Stated Goal: to return to PLOF PT Goal Formulation: With patient Time For Goal Achievement: 04/04/17 Potential to Achieve Goals: Good Progress towards PT goals: Progressing toward goals    Frequency    Min 3X/week      PT Plan Current plan  remains appropriate    Co-evaluation PT/OT/SLP Co-Evaluation/Treatment: Yes            AM-PAC PT "6 Clicks" Daily Activity  Outcome Measure  Difficulty turning over in bed (including adjusting bedclothes, sheets and blankets)?: Total Difficulty moving from lying on back to sitting on the side of the bed? : Total Difficulty sitting down on and standing up from a chair with arms (e.g., wheelchair, bedside commode, etc,.)?: Total Help needed moving to and from a bed to chair (including a wheelchair)?: A Lot Help needed walking in hospital room?: A Lot Help needed climbing 3-5 steps with a railing? : Total 6 Click Score: 8    End of Session Equipment Utilized During Treatment: Gait belt;Oxygen (4-6L) Activity Tolerance: Patient limited by fatigue Patient left: in bed;with call bell/phone within reach Nurse Communication: Mobility status PT Visit Diagnosis: Other abnormalities of gait and mobility (R26.89)     Time: 4268-3419 PT Time Calculation (min) (ACUTE ONLY): 17 min  Charges:  $Gait Training: 8-22 mins                    G Codes:        B. Migdalia Dk PT, DPT Acute Rehabilitation  [phone] Pager [phone]     [place] 03/23/2017, 4:16 PM

## 2017-03-23 NOTE — Progress Notes (Signed)
Family Medicine Teaching Service Daily Progress Note Intern Pager: [phone]  Patient name: Karen Jackson Medical record number: [id number] Date of birth: Dec 01, 1940 Age: 76 y.o. Gender: female  Primary Care Provider: Zenia Resides, MD Consultants: none Code Status: FULL  Pt Overview and Major Events to Date:  Admitted to telemetry on 7/23.  Rapid response called twice during the night of 7/23 due to patient's desaturations.  Newly discovered atrial fibrillation with RVR on the night of 7/23.  Febrile to 101.9 on 7/24, responded to tylenol.  Rapid response again called on morning of 7/25 for desaturation, patient responded well to BiPAP.  Patient required BiPAP overnight on 7/25.  Assessment and Plan: Karen Jackson is a 76 y.o. female presenting with cough and SOB of five days' duration. PMH is significant for hemorrhagic stroke in 2013 with residual left sided weakness, HTN, HLD, Type 2 DM, hypomagnesemia, urinary frequency, GERD, and OA.  SOB:  On arrival meeting sepsis criteria with fever to 103F, tachycardia and tachypnea.  DDx includes PNA, PE, CHF.  Patient reports SOB with cough and congestion since 7/20.  She also noticed some left leg pain and swelling that prevented her from bearing weight.  She presented to clinic today and desatted to 87%, which increased to 92% when given 2L O2.  She is not on O2 at home and this is a new requirement for her.  She was transferred to the ED and desatted again there, so she was put on 4L O2 with SpO2 in the mid-90's.  WBC 10.4, LA 1.27 and febrile to 103F in the ED.  Given 1L NS bolus x 5, albuterol, and ipratropium in the ED. UA hazy with many bacteria, moderate leuks and positive for nitrite.  She denied dysuria however endorsing increased frequency of urination over last 2 weeks.  She was started on CTX and azithromycin in the ED.  Treating for possible pneumonia and UTI given fever however CXR negative for infiltrate.  LE venous dopplers in ED negative  for DVT and initial troponin also negative.  Procalcitonin negative.  Troponins 0.03 --> 0.04 --> 0.05.  BNP 241.  CXR on 7/25 showed bilateral infiltrates.  Blood cx negative x 3 days.  ECHO showed no systolic dysfunction but grade 3 diastolic dysfunction, which could be contributing to her SOB.  Has been on BiPAP for desaturations at night, but did not tolerate this well on night of 7/26, so placed on high flow [place] at 8L/min. -reassess fluid status for lasix administration - a little fluid overloaded this am, cards will see this am and decide on Lasix -continuous cardiac monitoring  -continue azithromycin and ceftriaxone -daily CBC, BMET -Tylenol PRN fever -continuous pulse ox -vitals per unit routine    New-onset atrial fibrillation with RVR: EKG overnight on 7/23 showed atrial fibrillation with RVR, rate into 130s.  Patient was asymptomatic, with no chest pain or palpitations at the time.  Was given metoprolol 25 mg and was in sinus rhythm 7/24 am.  A fib could be a result of new infection or could be independent.  CHA2DS2-VASc Score = 7 (11.2% stroke rate/year).  Patient has been in sinus rhythm for last two days. -cardiology following -continue new dose of metoprolol at 50 mg BID -after discussion with neurologist Dr. Leonie Jackson, will withhold anticoagulation except for ASA due to patient's hx of hemorrhagic stroke caused by primary HTN -continue cardiac monitoring   Hx of hemorrhagic stroke:  Left arm and leg are largely immobile, and patient  reports increased swelling in the left leg and foot.  She is not currently on anticoagulation due to hemorrhagic stroke.  MRI negative for acute intracranial abnormality, positive for remote hemorrhagic stroke in posterior limb of right internal capsule and lateral thalamus. -continue gabapentin 100 mg TID   Urinary frequency 2/2 UTI: Has been going to the bathroom hourly during the night over the past two weeks.  She says this is worse than her usual  nighttime urinary frequency.  Denies dysuria.  UA positive for bacteria, nitrite, leukocyte esterase.  Urine cx negative (final). -continue ceftriaxone  -monitor UOP   HTN:  BP 124/61 on admission.  Takes amlodipine 10 mg daily and losartan-HCTZ 100-12.5 mg daily.  BP 114/53 on 7/27 am -discontinued amlodipine -holding HCTZ, continuing on half dose of losartan -monitor BPs    HLD:  Takes Lipitor 20 mg daily -continue home Lipitor   Type 2 DM:  Last A1C in April was 6.6.  A1C measured during this hospital stay is 6.6 -sensitive SSI   GERD:  Takes Prilosec 20 mg at home -Protonix 40 mg daily   Hypomagnesemia.  At home on 400 mg po daily -Continue home Mag    OA:  Stable. Takes Celebrex 200 mg at home -continue home Celebrex  FEN/GI: carb modified, protonix Prophylaxis: Lovenox  Disposition: Home  Subjective:  Karen Jackson is lying inclined in bed and appears comfortable on HFNC 10 L/min this morning.  She says she had a rough night because the BiPAP mask didn't fit her face well and the alarm kept going off, but she feels better this morning.  We discussed the neurologist's recommendation about ASA only for anticoagulation, and she was in agreement with that plan.    Objective: Temp:  [97.3 F (36.3 C)-99.2 F (37.3 C)] 99.1 F (37.3 C) (07/27 0400) Pulse Rate:  [93-153] 99 (07/27 0400) Resp:  [17-26] 26 (07/27 0400) BP: (96-133)/(53-80) 114/53 (07/27 0400) SpO2:  [81 %-98 %] 96 % (07/27 0400) Weight:  [191 lb 12.8 oz (87 kg)] 191 lb 12.8 oz (87 kg) (07/27 0400) Physical Exam: General: lying in bed, able to talk easily Cardiovascular: irregularly irregular rhythm, tachycardic Respiratory: expiratory wheezing, no crackles heard Abdomen: nontender to palpation, +bowel sounds Extremities: bilateral non-pitting edema in lower extremities, increased from yesterday  Laboratory:  Recent Labs Lab 03/21/17 0418 03/22/17 0407 03/23/17 0242  WBC 12.6* 9.6 8.3  HGB  13.4 12.2 12.5  HCT 39.4 36.4 39.0  PLT 221 234 281    Recent Labs Lab 03/19/17 1244  03/21/17 0418 03/22/17 0407 03/23/17 0242  NA 131*  < > 132* 134* 135  K 3.4*  < > 4.1 2.9* 4.5  CL 90*  < > 95* 94* 95*  CO2 29  < > 27 31 32  BUN 9  < > 15 11 16   CREATININE 0.68  < > 0.66 0.64 0.61  CALCIUM 9.4  < > 8.2* 7.9* 8.2*  PROT 7.8  --   --   --   --   BILITOT 1.0  --   --   --   --   ALKPHOS 61  --   --   --   --   ALT 17  --   --   --   --   AST 28  --   --   --   --   GLUCOSE 185*  < > 150* 120* 124*  < > = values in this interval not displayed.  Imaging/Diagnostic Tests: No results found.   Kathrene Alu, MD 03/23/2017, 6:53 AM PGY-1, [place] Intern pager: [phone], text pages welcome

## 2017-03-24 DIAGNOSIS — I5033 Acute on chronic diastolic (congestive) heart failure: Secondary | ICD-10-CM

## 2017-03-24 LAB — BASIC METABOLIC PANEL
Anion gap: 8 (ref 5–15)
BUN: 17 mg/dL (ref 6–20)
CO2: 29 mmol/L (ref 22–32)
CREATININE: 0.6 mg/dL (ref 0.44–1.00)
Calcium: 8.4 mg/dL — ABNORMAL LOW (ref 8.9–10.3)
Chloride: 97 mmol/L — ABNORMAL LOW (ref 101–111)
GFR calc Af Amer: >60 mL/min (ref >60)
GLUCOSE: 140 mg/dL — AB (ref 65–99)
POTASSIUM: 6 mmol/L — AB (ref 3.5–5.1)
SODIUM: 134 mmol/L — AB (ref 135–145)

## 2017-03-24 LAB — GLUCOSE, CAPILLARY
GLUCOSE-CAPILLARY: 140 mg/dL — AB (ref 65–99)
GLUCOSE-CAPILLARY: 141 mg/dL — AB (ref 65–99)
Glucose-Capillary: 152 mg/dL — ABNORMAL HIGH (ref 65–99)
Glucose-Capillary: 213 mg/dL — ABNORMAL HIGH (ref 65–99)

## 2017-03-24 LAB — CBC
HCT: 38.6 % (ref 36.0–46.0)
HEMOGLOBIN: 12.2 g/dL (ref 12.0–15.0)
MCH: 31.5 pg (ref 26.0–34.0)
MCHC: 31.6 g/dL (ref 30.0–36.0)
MCV: 99.7 fL (ref 78.0–100.0)
PLATELETS: 377 10*3/uL (ref 150–400)
RBC: 3.87 MIL/uL (ref 3.87–5.11)
RDW: 13.1 % (ref 11.5–15.5)
WBC: 7.9 10*3/uL (ref 4.0–10.5)

## 2017-03-24 LAB — CULTURE, BLOOD (ROUTINE X 2)
CULTURE: NO GROWTH
Culture: NO GROWTH
SPECIAL REQUESTS: ADEQUATE
Special Requests: ADEQUATE

## 2017-03-24 MED ORDER — IPRATROPIUM-ALBUTEROL 0.5-2.5 (3) MG/3ML IN SOLN
3.0000 mL | Freq: Three times a day (TID) | RESPIRATORY_TRACT | Status: DC
  Administered 2017-03-25 – 2017-03-27 (×7): 3 mL via RESPIRATORY_TRACT
  Filled 2017-03-24 (×7): qty 3

## 2017-03-24 MED ORDER — DILTIAZEM HCL 60 MG PO TABS
60.0000 mg | ORAL_TABLET | Freq: Four times a day (QID) | ORAL | Status: DC
  Administered 2017-03-24 – 2017-03-27 (×10): 60 mg via ORAL
  Filled 2017-03-24 (×10): qty 1

## 2017-03-24 NOTE — Progress Notes (Signed)
Progress Note  Patient Name: Karen Jackson Date of Encounter: 03/24/2017  Primary Cardiologist: Dr. Glenetta Hew  Subjective   Slept a little better last night. Still short of breath. No chest pain or palpitations.  Inpatient Medications    Scheduled Meds: . aspirin EC  81 mg Oral Daily  . atorvastatin  20 mg Oral Daily  . celecoxib  200 mg Oral Daily  . diltiazem  90 mg Oral Q12H  . enoxaparin (LOVENOX) injection  40 mg Subcutaneous Q24H  . furosemide  40 mg Intravenous Daily  . gabapentin  100 mg Oral TID  . insulin aspart  0-9 Units Subcutaneous TID WC  . ipratropium-albuterol  3 mL Nebulization Q6H  . magnesium oxide  400 mg Oral Daily  . mouth rinse  15 mL Mouth Rinse BID  . metoprolol tartrate  5 mg Intravenous Once  . metoprolol tartrate  50 mg Oral BID  . pantoprazole  40 mg Oral Daily   Continuous Infusions: . azithromycin Stopped (03/23/17 1612)  . cefTRIAXone (ROCEPHIN)  IV Stopped (03/23/17 1449)   PRN Meds: acetaminophen **OR** acetaminophen, ipratropium-albuterol, metoprolol tartrate, promethazine, traZODone   Vital Signs    Vitals:   03/24/17 0815 03/24/17 0900 03/24/17 1034 03/24/17 1035  BP: 127/66  127/65   Pulse: (!) 105   97  Resp: (!) 29     Temp: 98.1 F (36.7 C)     TempSrc: Oral     SpO2: 90% 90%    Weight:      Height:        Intake/Output Summary (Last 24 hours) at 03/24/17 1037 Last data filed at 03/24/17 0617  Gross per 24 hour  Intake                0 ml  Output              301 ml  Net             -301 ml   Filed Weights   03/23/17 0400 03/23/17 1145 03/24/17 0400  Weight: 191 lb 12.8 oz (87 kg) 188 lb 7.9 oz (85.5 kg) 187 lb 2.7 oz (84.9 kg)    Telemetry    Atrial fibrillation with RVR. Personally reviewed.  ECG    Tracing from 03/20/2017 showed rate-controlled atrial fibrillation with poor R-waveprogression. Personally reviewed.  Physical Exam   GEN: Overweight elderly woman. No acute distress.   Neck: No  JVD. Cardiac:  Irregularly irregular, no gallop.  Respiratory: Course breath sounds with scattered rhonchi and prolonged expiratory phase. GI: Soft, nontender, bowel sounds present. MS: Improved edema; No deformity.  Labs    Chemistry Recent Labs Lab 03/19/17 1244  03/22/17 0407 03/23/17 0242 03/24/17 0244  NA 131*  < > 134* 135 134*  K 3.4*  < > 2.9* 4.5 6.0*  CL 90*  < > 94* 95* 97*  CO2 29  < > 31 32 29  GLUCOSE 185*  < > 120* 124* 140*  BUN 9  < > 11 16 17   CREATININE 0.68  < > 0.64 0.61 0.60  CALCIUM 9.4  < > 7.9* 8.2* 8.4*  PROT 7.8  --   --   --   --   ALBUMIN 4.2  --   --   --   --   AST 28  --   --   --   --   ALT 17  --   --   --   --  ALKPHOS 61  --   --   --   --   BILITOT 1.0  --   --   --   --   GFRNONAA >60  < > >60 >60 >60  GFRAA >60  < > >60 >60 >60  ANIONGAP 12  < > 9 8 8   < > = values in this interval not displayed.   Hematology Recent Labs Lab 03/22/17 0407 03/23/17 0242 03/24/17 0244  WBC 9.6 8.3 7.9  RBC 3.76* 3.97 3.87  HGB 12.2 12.5 12.2  HCT 36.4 39.0 38.6  MCV 96.8 98.2 99.7  MCH 32.4 31.5 31.5  MCHC 33.5 32.1 31.6  RDW 12.8 12.9 13.1  PLT 234 281 377    Cardiac Enzymes Recent Labs Lab 03/19/17 1846 03/20/17 0134 03/20/17 0645  TROPONINI 0.03* 0.04* 0.05*   No results for input(s): TROPIPOC in the last 168 hours.   BNP Recent Labs Lab 03/19/17 1846  BNP 241.1*     DDimer  Recent Labs Lab 03/19/17 1846  DDIMER 0.77*     Radiology    Dg Chest 2 View  Result Date: 03/23/2017 CLINICAL DATA:  Shortness of breath. EXAM: CHEST  2 VIEW COMPARISON:  Chest x-ray dated March 21, 2017. FINDINGS: The cardiomediastinal silhouette remains mildly enlarged. Persistent patchy left greater than right perihilar opacities and left lower lobe consolidation. Probable small left pleural effusion, unchanged. No pneumothorax. No acute osseous abnormality. IMPRESSION: Persistent perihilar opacities and left lower lobe consolidation,  consistent with multifocal pneumonia. Followup PA and lateral chest X-ray is recommended in 3-4 weeks following trial of antibiotic therapy to ensure resolution and exclude underlying malignancy. Electronically Signed   By: Titus Dubin M.D.   On: 03/23/2017 13:58    Cardiac Studies   Echocardiogram 03/21/2017: Study Conclusions  - Left ventricle: The cavity size was normal. Systolic function was   normal. The estimated ejection fraction was in the range of 60%   to 65%. Wall motion was normal; there were no regional wall   motion abnormalities. There was a reduced contribution of atrial   contraction to ventricular filling, due to increased ventricular   diastolic pressure or atrial contractile dysfunction. Doppler   parameters are consistent with a reversible restrictive pattern,   indicative of decreased left ventricular diastolic compliance   and/or increased left atrial pressure (grade 3 diastolic   dysfunction). Doppler parameters are consistent with high   ventricular filling pressure. - Aortic valve: Poorly visualized. - Mitral valve: Calcified annulus. There was mild regurgitation. - Left atrium: The atrium was mildly to moderately dilated.   Anterior-posterior dimension: 48 mm. - Right atrium: The atrium was mildly dilated. - Pulmonic valve: Poorly visualized. - Pulmonary arteries: Systolic pressure could not be accurately   estimated. Patient Profile     76 y.o. female with history of previous hemorrhagic stroke, hypertension, hyperlipidemia, type 2 diabetes mellitus, now admitted with community acquired pneumonia and UTI in association with acute on chronic diastolic heart failure and atrial fibrillation with RVR.  Assessment & Plan    1. Persistent atrial fibrillation with RVR. Currently on Cardizem 90 mg every 12 hours and Lopressor 50 mg twice a day. CHADSVASC score is 7, but she was not felt to be a candidate for oral anticoagulation in light of history of  hemorrhagic stroke. She is on aspirin.  2. Acute on chronic diastolic heart failure. She does have restrictive diastolic filling pattern by recent echocardiogram. Continues to diurese on IV Lasix and renal function  is stable.  3. CXR concerning for multifocal pneumonia, although component of fluid overload also likely. She is being diuresed and is concurrently on antibiotics per primary team.  4. Previous history of hemorrhagic stroke with residual and limited functional capacity.  5. UTI.  6. Hyperlipidemia, on Lipitor.  For now would continue current dose of Lopressor. Change Cardizem to 60 mg every 6 hours. Continue ASA and Lasix. When heart rate more optimal, will work toward conversion to long acting Cardizem.  Signed, Rozann Lesches, MD  03/24/2017, 10:37 AM

## 2017-03-24 NOTE — Progress Notes (Addendum)
Patient refused BIPAP use for the evening. RT will continue to monitor as needed.

## 2017-03-24 NOTE — Progress Notes (Signed)
Family Medicine Teaching Service Daily Progress Note Intern Pager: [phone]  Patient name: Karen Jackson Medical record number: [id number] Date of birth: December 24, 1940 Age: 76 y.o. Gender: female  Primary Care Provider: Zenia Resides, MD Consultants: none Code Status: FULL  Pt Overview and Major Events to Date:  Admitted to telemetry on 7/23.  Rapid response called twice during the night of 7/23 due to patient's desaturations.  Newly discovered atrial fibrillation with RVR on the night of 7/23.  Febrile to 101.9 on 7/24, responded to tylenol.  Rapid response again called on morning of 7/25 for desaturation, patient responded well to BiPAP.  Patient required BiPAP overnight on 7/25.  Assessment and Plan: Karen Jackson is a 76 y.o. female presenting with cough and SOB of five days' duration. PMH is significant for hemorrhagic stroke in 2013 with residual left sided weakness, HTN, HLD, Type 2 DM, hypomagnesemia, urinary frequency, GERD, and OA.  SOB:  On arrival meeting sepsis criteria with fever to 103F, tachycardia and tachypnea.  DDx includes PNA, PE, CHF.  Patient reports SOB with cough and congestion since 7/20.  She also noticed some left leg pain and swelling that prevented her from bearing weight.  She presented to clinic today and desatted to 87%, which increased to 92% when given 2L O2.  She is not on O2 at home and this is a new requirement for her.  She was transferred to the ED and desatted again there, so she was put on 4L O2 with SpO2 in the mid-90's.  WBC 10.4, LA 1.27 and febrile to 103F in the ED.  Given 1L NS bolus x 5, albuterol, and ipratropium in the ED. UA hazy with many bacteria, moderate leuks and positive for nitrite.  She denied dysuria however endorsing increased frequency of urination over last 2 weeks.  She was started on CTX and azithromycin in the ED.  Treating for possible pneumonia and UTI given fever however CXR negative for infiltrate.  LE venous dopplers in ED negative  for DVT and initial troponin also negative.  Procalcitonin negative.  Troponins 0.03 --> 0.04 --> 0.05.  BNP 241.  CXR on 7/25 showed bilateral infiltrates.  Blood cx negative x 3 days.  ECHO showed no systolic dysfunction but grade 3 diastolic dysfunction, which could be contributing to her SOB.  Has been on BiPAP for desaturations at night, but did not tolerate this well on night of 7/26, so placed on high flow [place] at 8L/min.  Continues to sat in the lower 90s on 10L HFNC 7/28. -continue daily IV Lasix 40 mg -continuous cardiac monitoring  -continue azithromycin and ceftriaxone -daily CBC, BMET -Tylenol PRN fever -continuous pulse ox -vitals per unit routine -HFNC as needed -will D/C azithromycin after today's dose -transfer to floor with telemetry on 7/27   New-onset atrial fibrillation with RVR: EKG overnight on 7/23 showed atrial fibrillation with RVR, rate into 130s.  Patient was asymptomatic, with no chest pain or palpitations at the time.  Was given metoprolol 25 mg and was in sinus rhythm 7/24 am.  A fib could be a result of new infection or could be independent.  CHA2DS2-VASc Score = 7 (11.2% stroke rate/year).  Patient has been in sinus rhythm for last two days. -cardiology following -continue diltiazem 90 mg BID -continue metoprolol at 50 mg BID -after discussion with neurologist Dr. Leonie Man, will withhold anticoagulation except for ASA due to patient's hx of hemorrhagic stroke caused by primary HTN -continue cardiac monitoring   Hx  of hemorrhagic stroke:  Left arm and leg are largely immobile, and patient reports increased swelling in the left leg and foot.  She is not currently on anticoagulation due to hemorrhagic stroke.  MRI negative for acute intracranial abnormality, positive for remote hemorrhagic stroke in posterior limb of right internal capsule and lateral thalamus. -continue gabapentin 100 mg TID -withholding anticoagulation, prescribed ASA only given hx of primary  hemorrhagic stroke   Urinary frequency 2/2 UTI: Has been going to the bathroom hourly during the night over the past two weeks.  She says this is worse than her usual nighttime urinary frequency.  Denies dysuria.  UA positive for bacteria, nitrite, leukocyte esterase.  Urine cx negative (final). -continue ceftriaxone  -monitor UOP   HTN:  BP 124/61 on admission.  Takes amlodipine 10 mg daily and losartan-HCTZ 100-12.5 mg daily.  BP 110/65 on 7/28 am -discontinued amlodipine -holding HCTZ -further decreased dose of losartan to 25 mg daily due to addition of diltiazem per cards recs -monitor BPs    HLD:  Takes Lipitor 20 mg daily -continue home Lipitor   Type 2 DM:  Last A1C in April was 6.6.  A1C measured during this hospital stay is 6.6 -sensitive SSI  Hyperkalemia:  K+ 6.0 on 7/28 after receiving Kdur 40 mEq BID yesterday.  Patient denies any cardiac or muscular symptoms.  Hopefully this will return to normal since no longer receiving Kdur and continues to receive Lasix -BMP on 7/29 -holding losartan today, can restart at 25 mg daily once hyperkalemia resolves   GERD:  Takes Prilosec 20 mg at home -Protonix 40 mg daily   Hypomagnesemia.  At home on 400 mg po daily -Continue home Mag    OA:  Stable. Takes Celebrex 200 mg at home -continue home Celebrex  FEN/GI: carb modified, protonix Prophylaxis: Lovenox  Disposition: SNF once stable  Subjective:  Karen Jackson is lying inclined in bed and appears comfortable on HFNC 10 L/min this morning.  She says she had a much better night last night on HFNC rather than BiPAP.  No pain or weakness.  Objective: Temp:  [98.3 F (36.8 C)-98.8 F (37.1 C)] 98.3 F (36.8 C) (07/28 0400) Pulse Rate:  [51-93] 93 (07/28 0400) Resp:  [19-23] 23 (07/28 0400) BP: (103-110)/(58-65) 110/65 (07/28 0400) SpO2:  [90 %-93 %] 90 % (07/28 0900) Weight:  [187 lb 2.7 oz (84.9 kg)-188 lb 7.9 oz (85.5 kg)] 187 lb 2.7 oz (84.9 kg) (07/28  0400) Physical Exam: General: lying in bed, able to talk easily Cardiovascular: sinus rhythm, slightly tachycardic Respiratory: expiratory wheezing, no crackles heard Abdomen: nontender to palpation, +bowel sounds Extremities: bilateral non-pitting edema in lower extremities, increased from yesterday  Laboratory:  Recent Labs Lab 03/22/17 0407 03/23/17 0242 03/24/17 0244  WBC 9.6 8.3 7.9  HGB 12.2 12.5 12.2  HCT 36.4 39.0 38.6  PLT 234 281 377    Recent Labs Lab 03/19/17 1244  03/22/17 0407 03/23/17 0242 03/24/17 0244  NA 131*  < > 134* 135 134*  K 3.4*  < > 2.9* 4.5 6.0*  CL 90*  < > 94* 95* 97*  CO2 29  < > 31 32 29  BUN 9  < > 11 16 17   CREATININE 0.68  < > 0.64 0.61 0.60  CALCIUM 9.4  < > 7.9* 8.2* 8.4*  PROT 7.8  --   --   --   --   BILITOT 1.0  --   --   --   --  ALKPHOS 61  --   --   --   --   ALT 17  --   --   --   --   AST 28  --   --   --   --   GLUCOSE 185*  < > 120* 124* 140*  < > = values in this interval not displayed.   Imaging/Diagnostic Tests: Dg Chest 2 View  Result Date: 03/23/2017 CLINICAL DATA:  Shortness of breath. EXAM: CHEST  2 VIEW COMPARISON:  Chest x-ray dated March 21, 2017. FINDINGS: The cardiomediastinal silhouette remains mildly enlarged. Persistent patchy left greater than right perihilar opacities and left lower lobe consolidation. Probable small left pleural effusion, unchanged. No pneumothorax. No acute osseous abnormality. IMPRESSION: Persistent perihilar opacities and left lower lobe consolidation, consistent with multifocal pneumonia. Followup PA and lateral chest X-ray is recommended in 3-4 weeks following trial of antibiotic therapy to ensure resolution and exclude underlying malignancy. Electronically Signed   By: Titus Dubin M.D.   On: 03/23/2017 13:58     Kathrene Alu, MD 03/24/2017, 9:33 AM PGY-1, [place] Intern pager: [phone], text pages welcome

## 2017-03-25 LAB — GLUCOSE, CAPILLARY
GLUCOSE-CAPILLARY: 156 mg/dL — AB (ref 65–99)
GLUCOSE-CAPILLARY: 154 mg/dL — AB (ref 65–99)
Glucose-Capillary: 142 mg/dL — ABNORMAL HIGH (ref 65–99)
Glucose-Capillary: 147 mg/dL — ABNORMAL HIGH (ref 65–99)

## 2017-03-25 LAB — BASIC METABOLIC PANEL
ANION GAP: 11 (ref 5–15)
BUN: 12 mg/dL (ref 6–20)
CALCIUM: 9.3 mg/dL (ref 8.9–10.3)
CO2: 31 mmol/L (ref 22–32)
Chloride: 95 mmol/L — ABNORMAL LOW (ref 101–111)
Creatinine, Ser: 0.57 mg/dL (ref 0.44–1.00)
Glucose, Bld: 154 mg/dL — ABNORMAL HIGH (ref 65–99)
POTASSIUM: 4.7 mmol/L (ref 3.5–5.1)
SODIUM: 137 mmol/L (ref 135–145)

## 2017-03-25 NOTE — Progress Notes (Signed)
Family Medicine Teaching Service Daily Progress Note Intern Pager: [phone]  Patient name: Karen Jackson Medical record number: [id number] Date of birth: October 14, 1940 Age: 76 y.o. Gender: female  Primary Care Provider: Zenia Resides, MD Consultants: none Code Status: FULL  Pt Overview and Major Events to Date:  Admitted to telemetry on 7/23 with PNA vs UTI and O2 requirement. Newly discovered atrial fibrillation with RVR on the night of 7/23.  Febrile to 101.9 on 7/24, responded to tylenol.  Intermittent requirement of BiPAP. New finding of dCHF, likely exacerbated by afib, cards on board. Resp status has been responding to lasix.   Assessment and Plan: Karen Jackson is a 76 y.o. female presenting with cough and SOB of five days' duration. PMH is significant for hemorrhagic stroke in 2013 with residual left sided weakness, HTN, HLD, Type 2 DM, hypomagnesemia, urinary frequency, GERD, and OA.  Bilateral pneumonia: Improving. No home O2 requirement, now requiring HFNC and BiPAP. S/p CTX and azithro 7/23>7/28. Completed course of azithro. Continuing ceftriaxone.  -continue ceftriaxone for 10 day course, end date 8/1 -Tylenol PRN fever -continuous pulse ox -vitals per unit routine -HFNC as needed  Acute on Chronic Diastolic CHF: new diagnosis for this patient. EF 60-65% with G3DD. Cards following. Responds well to IV lasix. Fluid overload likely contributed to her respiratory distress initially.  -continue daily lasix IV 40mg  -tele -daily BMP  New-onset atrial fibrillation with RVR: A fib in the setting of infection vs new independent finding. CHA2DS2-VASc Score = 7 (11.2% stroke rate/year).  Patient in NSR.  -cardiology following -diltiazem 60 mg q6H -continue metoprolol at 50 mg BID -after discussion with neurologist Dr. Leonie Man, will withhold anticoagulation except for ASA due to patient's hx of hemorrhagic stroke caused by primary HTN -continue cardiac monitoring  Hx of hemorrhagic  stroke:  Left arm and leg are largely immobile, and patient reports increased swelling in the left leg and foot.  She is not currently on anticoagulation due to hemorrhagic stroke.  MRI negative for acute intracranial abnormality, positive for remote hemorrhagic stroke in posterior limb of right internal capsule and lateral thalamus. -continue gabapentin 100 mg TID -withholding anticoagulation, prescribed ASA only given hx of primary hemorrhagic stroke  HTN:  Home meds were norvasc and losartan-HCTZ. Given new onset afib above, BP now controlled with dilt and metoprolol.  -monitor BPs  -continue diltiazem and metoprolol as above -losartan was held due to hyperkalemia on 7/28  HLD:  Takes Lipitor 20 mg daily -continue home Lipitor  Type 2 DM:  Last A1C in April was 6.6.  A1C measured during this hospital stay is 6.6. Used 6U SSI overnight. -sensitive SSI  GERD:  Takes Prilosec 20 mg at home -Protonix 40 mg daily  Hypomagnesemia.  At home on 400 mg po daily -Continue home Mag   OA:  Stable. Takes Celebrex 200 mg at home -continue home Celebrex  FEN/GI: carb modified, protonix Prophylaxis: Lovenox  Disposition: SNF once stable, anticipate 2 more MNs given BiPAP and IV lasix  Subjective:  Patient with HFNC on this morning, son at bedside. Feels well, able to carry on long conversation. Asks questions about when she will be ready for SNF.   Objective: Temp:  [98.3 F (36.8 C)-98.9 F (37.2 C)] 98.7 F (37.1 C) (07/29 0805) Pulse Rate:  [76-107] 85 (07/29 0805) Resp:  [23-28] 25 (07/29 0805) BP: (113-129)/(64-83) 129/75 (07/29 0823) SpO2:  [88 %-96 %] 94 % (07/29 0836) FiO2 (%):  [60 %] 60 % (07/29  0403) Weight:  [187 lb 6.3 oz (85 kg)] 187 lb 6.3 oz (85 kg) (07/29 0423) Physical Exam: General: lying in bed w HFNC in AND Cardiovascular: sinus rhythm, normal rate, no murmur Respiratory: minimal expiratory wheezing, no crackles heard Abdomen: nontender to palpation,  +bowel sounds Extremities: bilateral 1+ pitting edema in lower extremities  Laboratory:  Recent Labs Lab 03/22/17 0407 03/23/17 0242 03/24/17 0244  WBC 9.6 8.3 7.9  HGB 12.2 12.5 12.2  HCT 36.4 39.0 38.6  PLT 234 281 377    Recent Labs Lab 03/19/17 1244  03/23/17 0242 03/24/17 0244 03/25/17 0323  NA 131*  < > 135 134* 137  K 3.4*  < > 4.5 6.0* 4.7  CL 90*  < > 95* 97* 95*  CO2 29  < > 32 29 31  BUN 9  < > 16 17 12   CREATININE 0.68  < > 0.61 0.60 0.57  CALCIUM 9.4  < > 8.2* 8.4* 9.3  PROT 7.8  --   --   --   --   BILITOT 1.0  --   --   --   --   ALKPHOS 61  --   --   --   --   ALT 17  --   --   --   --   AST 28  --   --   --   --   GLUCOSE 185*  < > 124* 140* 154*  < > = values in this interval not displayed.   Imaging/Diagnostic Tests: No results found.   Sela Hilding, MD 03/25/2017, 9:42 AM PGY-2, [place] [facility] Intern pager: [phone], text pages welcome

## 2017-03-25 NOTE — Progress Notes (Signed)
Rounding. Pt purewick camee off during sleep so patient was washed up, bed linen and gown changed. Pt on high flow o2 at 10L. Pt saturations in the low 80's, high 70s. Pt work of breathing increased at [employer]. RT called and suggested bumping patient to 15L high until RT arrived. Pt still not tolerating the high flow so RT administered a breathing treatment for wheezing and started the BiPAP again. Pt resting in bed. Call bed within reach. RR 23. Saturations low 90's. Will continue to monitor.

## 2017-03-25 NOTE — Progress Notes (Signed)
Progress Note  Patient Name: Karen Jackson Date of Encounter: 03/25/2017  Primary Cardiologist: Dr. Glenetta Hew  Subjective   Required BiPAP overnight and increased oxygen supplementation. Feels better this morning. No cough or chest pain.  Inpatient Medications    Scheduled Meds: . aspirin EC  81 mg Oral Daily  . atorvastatin  20 mg Oral Daily  . celecoxib  200 mg Oral Daily  . diltiazem  60 mg Oral Q6H  . enoxaparin (LOVENOX) injection  40 mg Subcutaneous Q24H  . furosemide  40 mg Intravenous Daily  . gabapentin  100 mg Oral TID  . insulin aspart  0-9 Units Subcutaneous TID WC  . ipratropium-albuterol  3 mL Nebulization TID  . magnesium oxide  400 mg Oral Daily  . mouth rinse  15 mL Mouth Rinse BID  . metoprolol tartrate  5 mg Intravenous Once  . metoprolol tartrate  50 mg Oral BID  . pantoprazole  40 mg Oral Daily   Continuous Infusions: . cefTRIAXone (ROCEPHIN)  IV Stopped (03/24/17 1433)   PRN Meds: acetaminophen **OR** acetaminophen, ipratropium-albuterol, metoprolol tartrate, promethazine, traZODone   Vital Signs    Vitals:   03/25/17 0423 03/25/17 0805 03/25/17 0823 03/25/17 0836  BP:  129/75 129/75   Pulse:  85    Resp:  (!) 25    Temp: 98.3 F (36.8 C) 98.7 F (37.1 C)    TempSrc: Oral Oral    SpO2:  96%  94%  Weight: 187 lb 6.3 oz (85 kg)     Height:        Intake/Output Summary (Last 24 hours) at 03/25/17 0906 Last data filed at 03/25/17 0428  Gross per 24 hour  Intake              360 ml  Output             1800 ml  Net            -1440 ml   Filed Weights   03/23/17 1145 03/24/17 0400 03/25/17 0423  Weight: 188 lb 7.9 oz (85.5 kg) 187 lb 2.7 oz (84.9 kg) 187 lb 6.3 oz (85 kg)    Telemetry    Atrial fibrillation/flutter. Personally reviewed.  ECG    Tracing from 03/20/2017 showed rate-controlled atrial fibrillation with poor R-waveprogression. Personally reviewed.  Physical Exam   GEN: Overweight elderly woman. No acute distress.    Neck: No JVD. Cardiac:  Irregularly irregular, no gallop.  Respiratory: Course breath sounds with scattered rhonchi and prolonged expiratory phase. GI: Soft, nontender, bowel sounds present. MS: Improved edema; No deformity.  Labs    Chemistry Recent Labs Lab 03/19/17 1244  03/23/17 0242 03/24/17 0244 03/25/17 0323  NA 131*  < > 135 134* 137  K 3.4*  < > 4.5 6.0* 4.7  CL 90*  < > 95* 97* 95*  CO2 29  < > 32 29 31  GLUCOSE 185*  < > 124* 140* 154*  BUN 9  < > 16 17 12   CREATININE 0.68  < > 0.61 0.60 0.57  CALCIUM 9.4  < > 8.2* 8.4* 9.3  PROT 7.8  --   --   --   --   ALBUMIN 4.2  --   --   --   --   AST 28  --   --   --   --   ALT 17  --   --   --   --   ALKPHOS 61  --   --   --   --  BILITOT 1.0  --   --   --   --   GFRNONAA >60  < > >60 >60 >60  GFRAA >60  < > >60 >60 >60  ANIONGAP 12  < > 8 8 11   < > = values in this interval not displayed.   Hematology  Recent Labs Lab 03/22/17 0407 03/23/17 0242 03/24/17 0244  WBC 9.6 8.3 7.9  RBC 3.76* 3.97 3.87  HGB 12.2 12.5 12.2  HCT 36.4 39.0 38.6  MCV 96.8 98.2 99.7  MCH 32.4 31.5 31.5  MCHC 33.5 32.1 31.6  RDW 12.8 12.9 13.1  PLT 234 281 377    Cardiac Enzymes  Recent Labs Lab 03/19/17 1846 03/20/17 0134 03/20/17 0645  TROPONINI 0.03* 0.04* 0.05*   No results for input(s): TROPIPOC in the last 168 hours.   BNP  Recent Labs Lab 03/19/17 1846  BNP 241.1*     DDimer   Recent Labs Lab 03/19/17 1846  DDIMER 0.77*     Radiology    Dg Chest 2 View  Result Date: 03/23/2017 CLINICAL DATA:  Shortness of breath. EXAM: CHEST  2 VIEW COMPARISON:  Chest x-ray dated March 21, 2017. FINDINGS: The cardiomediastinal silhouette remains mildly enlarged. Persistent patchy left greater than right perihilar opacities and left lower lobe consolidation. Probable small left pleural effusion, unchanged. No pneumothorax. No acute osseous abnormality. IMPRESSION: Persistent perihilar opacities and left lower lobe  consolidation, consistent with multifocal pneumonia. Followup PA and lateral chest X-ray is recommended in 3-4 weeks following trial of antibiotic therapy to ensure resolution and exclude underlying malignancy. Electronically Signed   By: Titus Dubin M.D.   On: 03/23/2017 13:58    Cardiac Studies   Echocardiogram 03/21/2017: Study Conclusions  - Left ventricle: The cavity size was normal. Systolic function was   normal. The estimated ejection fraction was in the range of 60%   to 65%. Wall motion was normal; there were no regional wall   motion abnormalities. There was a reduced contribution of atrial   contraction to ventricular filling, due to increased ventricular   diastolic pressure or atrial contractile dysfunction. Doppler   parameters are consistent with a reversible restrictive pattern,   indicative of decreased left ventricular diastolic compliance   and/or increased left atrial pressure (grade 3 diastolic   dysfunction). Doppler parameters are consistent with high   ventricular filling pressure. - Aortic valve: Poorly visualized. - Mitral valve: Calcified annulus. There was mild regurgitation. - Left atrium: The atrium was mildly to moderately dilated.   Anterior-posterior dimension: 48 mm. - Right atrium: The atrium was mildly dilated. - Pulmonic valve: Poorly visualized. - Pulmonary arteries: Systolic pressure could not be accurately   estimated. Patient Profile     76 y.o. female with history of previous hemorrhagic stroke, hypertension, hyperlipidemia, type 2 diabetes mellitus, now admitted with community acquired pneumonia and UTI in association with acute on chronic diastolic heart failure and atrial fibrillation with RVR.  Assessment & Plan    1. Persistent atrial fibrillation/flutter with intermittent RVR. CHADSVASC score is 7, but she was not felt to be a candidate for oral anticoagulation in light of history of hemorrhagic stroke. She is on aspirin.  Continuing to adjust rate control regimen. Generally, heart rate control was better yesterday.  2. Acute on chronic diastolic heart failure. She does have restrictive diastolic filling pattern by recent echocardiogram. Continues to diurese on IV Lasix and renal function is stable. 1400 cc out more than in the last 24 hours.  3. CXR concerning for multifocal pneumonia, although component of fluid overload also likely. She is being diuresed and is concurrently on antibiotics per primary team.  4. Previous history of hemorrhagic stroke with residual and limited functional capacity.  5. UTI.  6. Hyperlipidemia, on Lipitor.  Continue Lopressor 50 mg BID. Today will get full doses of Cardizem 60 mg every 6 hours. This can be further up titrated as needed. Once heart rate stable, can be converted to Cardizem CD. Continue ASA and Lasix.  Signed, Rozann Lesches, MD  03/25/2017, 9:06 AM

## 2017-03-26 ENCOUNTER — Encounter (HOSPITAL_COMMUNITY): Payer: Self-pay | Admitting: Family Medicine

## 2017-03-26 DIAGNOSIS — I4891 Unspecified atrial fibrillation: Secondary | ICD-10-CM | POA: Diagnosis present

## 2017-03-26 DIAGNOSIS — R0902 Hypoxemia: Secondary | ICD-10-CM | POA: Diagnosis present

## 2017-03-26 DIAGNOSIS — I481 Persistent atrial fibrillation: Secondary | ICD-10-CM

## 2017-03-26 DIAGNOSIS — I5030 Unspecified diastolic (congestive) heart failure: Secondary | ICD-10-CM | POA: Diagnosis present

## 2017-03-26 LAB — GLUCOSE, CAPILLARY
GLUCOSE-CAPILLARY: 118 mg/dL — AB (ref 65–99)
GLUCOSE-CAPILLARY: 190 mg/dL — AB (ref 65–99)
Glucose-Capillary: 125 mg/dL — ABNORMAL HIGH (ref 65–99)
Glucose-Capillary: 149 mg/dL — ABNORMAL HIGH (ref 65–99)

## 2017-03-26 LAB — BASIC METABOLIC PANEL
ANION GAP: 11 (ref 5–15)
BUN: 12 mg/dL (ref 6–20)
CALCIUM: 9.3 mg/dL (ref 8.9–10.3)
CO2: 32 mmol/L (ref 22–32)
Chloride: 94 mmol/L — ABNORMAL LOW (ref 101–111)
Creatinine, Ser: 0.52 mg/dL (ref 0.44–1.00)
Glucose, Bld: 132 mg/dL — ABNORMAL HIGH (ref 65–99)
Potassium: 3.8 mmol/L (ref 3.5–5.1)
SODIUM: 137 mmol/L (ref 135–145)

## 2017-03-26 LAB — CBC
HCT: 41 % (ref 36.0–46.0)
HEMOGLOBIN: 13.2 g/dL (ref 12.0–15.0)
MCH: 31.6 pg (ref 26.0–34.0)
MCHC: 32.2 g/dL (ref 30.0–36.0)
MCV: 98.1 fL (ref 78.0–100.0)
PLATELETS: 489 10*3/uL — AB (ref 150–400)
RBC: 4.18 MIL/uL (ref 3.87–5.11)
RDW: 12.9 % (ref 11.5–15.5)
WBC: 7.8 10*3/uL (ref 4.0–10.5)

## 2017-03-26 MED ORDER — CEFIXIME 400 MG PO CAPS
400.0000 mg | ORAL_CAPSULE | Freq: Every day | ORAL | Status: DC
  Administered 2017-03-26 – 2017-03-28 (×3): 400 mg via ORAL
  Filled 2017-03-26 (×3): qty 1

## 2017-03-26 NOTE — Progress Notes (Signed)
Family Medicine Teaching Service Daily Progress Note Intern Pager: [phone]  Patient name: Karen Jackson Medical record number: [id number] Date of birth: 1941/08/12 Age: 76 y.o. Gender: female  Primary Care Provider: Zenia Resides, MD Consultants: none Code Status: FULL  Pt Overview and Major Events to Date:  Admitted to telemetry on 7/23 with PNA vs UTI and O2 requirement. Newly discovered atrial fibrillation with RVR on the night of 7/23.  Febrile to 101.9 on 7/24, responded to tylenol.  Intermittent requirement of BiPAP. New finding of dCHF, likely exacerbated by afib, cards on board. Resp status has been responding to lasix.   Assessment and Plan: Karen Jackson is a 76 y.o. female presenting with cough and SOB of five days' duration. PMH is significant for hemorrhagic stroke in 2013 with residual left sided weakness, HTN, HLD, Type 2 DM, hypomagnesemia, urinary frequency, GERD, and OA.  Bilateral pneumonia: Improving. No home O2 requirement, now requiring HFNC and BiPAP. S/p CTX and azithro 7/23>7/28. Completed course of azithro. Continuing ceftriaxone.  -transition ceftriaxone to Suprax for PO abx, will be done with course on 8/1 -Tylenol PRN fever -continuous pulse ox -vitals per unit routine -HFNC as needed -PT to see today -orthostatic vitals  Acute on Chronic Diastolic CHF: new diagnosis for this patient. EF 60-65% with G3DD. Cards following. Responds well to IV lasix. Fluid overload likely contributed to her respiratory distress initially.  -continue daily lasix IV 40mg , will transition to PO according to cards recs -tele -daily BMP  New-onset atrial fibrillation with RVR: A fib in the setting of infection vs new independent finding. CHA2DS2-VASc Score = 7 (11.2% stroke rate/year).  Patient in NSR.  -cardiology following, will transition to long-acting diltiazem when HR stabilizes -diltiazem 60 mg q6H -continue metoprolol at 50 mg BID -after discussion with neurologist  Dr. Leonie Man, will withhold anticoagulation except for ASA due to patient's hx of hemorrhagic stroke caused by primary HTN -continue cardiac monitoring  Hx of hemorrhagic stroke:  Left arm and leg are largely immobile, and patient reports increased swelling in the left leg and foot.  She is not currently on anticoagulation due to hemorrhagic stroke.  MRI negative for acute intracranial abnormality, positive for remote hemorrhagic stroke in posterior limb of right internal capsule and lateral thalamus. -continue gabapentin 100 mg TID -withholding anticoagulation, prescribed ASA only given hx of primary hemorrhagic stroke  HTN:  Home meds were norvasc and losartan-HCTZ. Given new onset afib above, BP now controlled with dilt and metoprolol.  -monitor BPs  -continue diltiazem and metoprolol as above -losartan was held due to hyperkalemia on 7/28  HLD:  Takes Lipitor 20 mg daily -continue home Lipitor  Type 2 DM:  Last A1C in April was 6.6.  A1C measured during this hospital stay is 6.6. Used 6U SSI overnight. -sensitive SSI  GERD:  Takes Prilosec 20 mg at home -Protonix 40 mg daily  Hypomagnesemia.  At home on 400 mg po daily -Continue home Mag   OA:  Stable. Takes Celebrex 200 mg at home -continue home Celebrex  FEN/GI: carb modified, protonix Prophylaxis: Lovenox  Disposition: SNF once stable, anticipate 2 more MNs given BiPAP and IV lasix  Subjective:  Patient says she had a good night last night and is hoping that she can go home tomorrow.  She denies chest pain, shortness of breath.  She is hoping that PT will help her get out of bed today.  Objective: Temp:  [98.7 F (37.1 C)-99 F (37.2 C)] 98.7 F (  37.1 C) (07/30 0400) Pulse Rate:  [50-95] 60 (07/30 0400) Resp:  [15-25] 15 (07/30 0400) BP: (105-145)/(50-75) 145/63 (07/30 0636) SpO2:  [87 %-96 %] 87 % (07/30 0400) Weight:  [186 lb 8 oz (84.6 kg)] 186 lb 8 oz (84.6 kg) (07/30 0400) Physical Exam: General: lying in  bed w HFNC in AND Cardiovascular: sinus rhythm, normal rate, no murmur Respiratory: CTAB, no crackles heard Abdomen: nontender to palpation, +bowel sounds Extremities: nonpitting, minimal edema in bilateral lower extremities  Laboratory:  Recent Labs Lab 03/23/17 0242 03/24/17 0244 03/26/17 0730  WBC 8.3 7.9 7.8  HGB 12.5 12.2 13.2  HCT 39.0 38.6 41.0  PLT 281 377 489*    Recent Labs Lab 03/19/17 1244  03/24/17 0244 03/25/17 0323 03/26/17 0730  NA 131*  < > 134* 137 137  K 3.4*  < > 6.0* 4.7 3.8  CL 90*  < > 97* 95* 94*  CO2 29  < > 29 31 32  BUN 9  < > 17 12 12   CREATININE 0.68  < > 0.60 0.57 0.52  CALCIUM 9.4  < > 8.4* 9.3 9.3  PROT 7.8  --   --   --   --   BILITOT 1.0  --   --   --   --   ALKPHOS 61  --   --   --   --   ALT 17  --   --   --   --   AST 28  --   --   --   --   GLUCOSE 185*  < > 140* 154* 132*  < > = values in this interval not displayed.   Imaging/Diagnostic Tests: No results found.   Kathrene Alu, MD 03/26/2017, 7:27 AM PGY-1, [place] Intern pager: [phone], text pages welcome

## 2017-03-26 NOTE — Care Management Important Message (Signed)
Important Message  Patient Details  Name: Karen Jackson MRN: [id number] Date of Birth: 05/25/1941   Medicare Important Message Given:  Yes Patient signed on 03/23/2017    Karen Jackson 03/26/2017, 8:17 AM

## 2017-03-26 NOTE — Progress Notes (Signed)
Clinical Social Worker is still following patient for disposition needs.  Karen Jackson, MSW,  [name]

## 2017-03-26 NOTE — Progress Notes (Signed)
Physical Therapy Treatment Patient Details Name: Karen Jackson MRN: [id number] DOB: Jan 18, 1941 Today's Date: 03/26/2017    History of Present Illness Pt is a 76 y/o female admitted secondary to cough and SOB. Pt with worsening respiratory function and went into acute respiratory failure requiring supplemental O2 and BiPAP. Pt now on 4L of O2 via [place]. Pt also with new onset a-fib with RVR. PMH including but not limited to HTN, DM, and hx of hemorrhagic CVA with residual L sided weakness.    PT Comments    Patient continues to demonstrate decreased strength and activity tolerance. Pt tolerated OOB to chair, side steps along EOB, and bilat LE therex with 10L O2 via [place] throughout session. Continue to progress as tolerated with anticipated d/c to SNF for further skilled PT services.    Follow Up Recommendations  SNF     Equipment Recommendations  None recommended by PT    Recommendations for Other Services       Precautions / Restrictions Precautions Precautions: Fall Precaution Comments: watch SPO2 Restrictions Weight Bearing Restrictions: No    Mobility  Bed Mobility Overal bed mobility: Needs Assistance Bed Mobility: Supine to Sit     Supine to sit: HOB elevated;Mod assist     General bed mobility comments: assist to elevate trunk into sitting with use of bed rail and use of bed pad to scoot hips toward EOB; cues for sequencing  Transfers Overall transfer level: Needs assistance Equipment used: Rolling walker (2 wheeled) Transfers: Sit to/from Stand Sit to Stand: Min assist;+2 safety/equipment         General transfer comment: pt stood X3 from EOB; pt became fatigued and needed to sit X2 prior to performing stand pivot to recliner; L LE blocked and hand over hand assist for L UE grip on RW; multimodal cues for posture and weight shifting  Ambulation/Gait Ambulation/Gait assistance: Min assist;+2 safety/equipment Ambulation Distance (Feet): 2 Feet Assistive device:  Rolling walker (2 wheeled) Gait Pattern/deviations: Decreased dorsiflexion - left;Decreased weight shift to left;Trunk flexed;Step-to pattern;Decreased step length - left Gait velocity: slowed   General Gait Details: pt took sidesteps along EOB with assist to weight shift and manage RW   Stairs            Wheelchair Mobility    Modified Rankin (Stroke Patients Only)       Balance Overall balance assessment: Needs assistance Sitting-balance support: Feet supported Sitting balance-Leahy Scale: Good     Standing balance support: During functional activity;Bilateral upper extremity supported Standing balance-Leahy Scale: Poor                              Cognition Arousal/Alertness: Awake/alert Behavior During Therapy: WFL for tasks assessed/performed Overall Cognitive Status: Within Functional Limits for tasks assessed                                        Exercises General Exercises - Lower Extremity Quad Sets: AROM;Both;10 reps Long Arc Quad: AROM;Both;15 reps (2 sets) Hip Flexion/Marching: AROM;Both;15 reps (2 sets)    General Comments General comments (skin integrity, edema, etc.): Pt on 10L O2 via [place] throughout session. SpO2 into upper 80s with mobilitiy and exercise but up to upper 90s with rest      Pertinent Vitals/Pain Pain Assessment: No/denies pain    Home Living  Prior Function            PT Goals (current goals can now be found in the care plan section) Acute Rehab PT Goals Patient Stated Goal: to return to PLOF PT Goal Formulation: With patient Time For Goal Achievement: 04/04/17 Potential to Achieve Goals: Good Progress towards PT goals: Progressing toward goals    Frequency    Min 3X/week      PT Plan Current plan remains appropriate    Co-evaluation              AM-PAC PT "6 Clicks" Daily Activity  Outcome Measure  Difficulty turning over in bed (including  adjusting bedclothes, sheets and blankets)?: Total Difficulty moving from lying on back to sitting on the side of the bed? : Total Difficulty sitting down on and standing up from a chair with arms (e.g., wheelchair, bedside commode, etc,.)?: Total Help needed moving to and from a bed to chair (including a wheelchair)?: A Lot Help needed walking in hospital room?: A Lot Help needed climbing 3-5 steps with a railing? : Total 6 Click Score: 8    End of Session Equipment Utilized During Treatment: Gait belt;Oxygen Activity Tolerance: Patient limited by fatigue Patient left: with call bell/phone within reach;in chair;with family/visitor present Nurse Communication: Mobility status PT Visit Diagnosis: Other abnormalities of gait and mobility (R26.89)     Time: 0930-1006 PT Time Calculation (min) (ACUTE ONLY): 36 min  Charges:  $Gait Training: 8-22 mins $Therapeutic Exercise: 8-22 mins                    G Codes:       Earney Navy, PTA Pager: [phone]     Darliss Cheney 03/26/2017, 10:29 AM

## 2017-03-26 NOTE — Progress Notes (Signed)
Progress Note  Patient Name: Karen Jackson Date of Encounter: 03/26/2017  Primary Cardiologist: Dr. Ellyn Hack  Subjective   Feels well  Inpatient Medications    Scheduled Meds: . aspirin EC  81 mg Oral Daily  . atorvastatin  20 mg Oral Daily  . Cefixime  400 mg Oral Daily  . celecoxib  200 mg Oral Daily  . diltiazem  60 mg Oral Q6H  . enoxaparin (LOVENOX) injection  40 mg Subcutaneous Q24H  . furosemide  40 mg Intravenous Daily  . gabapentin  100 mg Oral TID  . insulin aspart  0-9 Units Subcutaneous TID WC  . ipratropium-albuterol  3 mL Nebulization TID  . magnesium oxide  400 mg Oral Daily  . mouth rinse  15 mL Mouth Rinse BID  . metoprolol tartrate  50 mg Oral BID  . pantoprazole  40 mg Oral Daily   Continuous Infusions:  PRN Meds: acetaminophen **OR** acetaminophen, ipratropium-albuterol, metoprolol tartrate, promethazine, traZODone   Vital Signs    Vitals:   03/26/17 0800 03/26/17 0844 03/26/17 1210 03/26/17 1403  BP: 109/82  (!) 128/56   Pulse: 62  60   Resp: 20  (!) 22   Temp:   98.6 F (37 C)   TempSrc:   Oral   SpO2: 93% 93% 94% 91%  Weight:      Height:        Intake/Output Summary (Last 24 hours) at 03/26/17 1618 Last data filed at 03/25/17 1800  Gross per 24 hour  Intake              240 ml  Output                0 ml  Net              240 ml   Filed Weights   03/24/17 0400 03/25/17 0423 03/26/17 0400  Weight: 187 lb 2.7 oz (84.9 kg) 187 lb 6.3 oz (85 kg) 186 lb 8 oz (84.6 kg)    Telemetry    NSR - Personally Reviewed  ECG    None recently  Physical Exam   GEN: No acute distress.  Resting comfortably Neck: No JVD Cardiac: RRR, no murmurs, rubs, or gallops.  Respiratory: Clear to auscultation bilaterally. GI: Soft, nontender, non-distended  MS: No edema; No deformity. Neuro:  Nonfocal  Psych: Normal affect   Labs    Chemistry Recent Labs Lab 03/24/17 0244 03/25/17 0323 03/26/17 0730  NA 134* 137 137  K 6.0* 4.7 3.8  CL  97* 95* 94*  CO2 29 31 32  GLUCOSE 140* 154* 132*  BUN 17 12 12   CREATININE 0.60 0.57 0.52  CALCIUM 8.4* 9.3 9.3  GFRNONAA >60 >60 >60  GFRAA >60 >60 >60  ANIONGAP 8 11 11      Hematology Recent Labs Lab 03/23/17 0242 03/24/17 0244 03/26/17 0730  WBC 8.3 7.9 7.8  RBC 3.97 3.87 4.18  HGB 12.5 12.2 13.2  HCT 39.0 38.6 41.0  MCV 98.2 99.7 98.1  MCH 31.5 31.5 31.6  MCHC 32.1 31.6 32.2  RDW 12.9 13.1 12.9  PLT 281 377 489*    Cardiac Enzymes Recent Labs Lab 03/19/17 1846 03/20/17 0134 03/20/17 0645  TROPONINI 0.03* 0.04* 0.05*   No results for input(s): TROPIPOC in the last 168 hours.   BNP Recent Labs Lab 03/19/17 1846  BNP 241.1*     DDimer  Recent Labs Lab 03/19/17 1846  DDIMER 0.77*     Radiology  No results found.  Cardiac Studies     Patient Profile     76 y.o. female w/ h/o hemorrhagic stroke, PAF   Assessment & Plan    Can change diltiazem CD to 240 mg daily and stop short acting diltiazem.    Aspirin only.  Not a candidate for anticoagulation.  Will sign off.  Signed, Larae Grooms, MD  03/26/2017, 4:18 PM

## 2017-03-27 ENCOUNTER — Inpatient Hospital Stay (HOSPITAL_COMMUNITY): Payer: Medicare Other

## 2017-03-27 DIAGNOSIS — I5030 Unspecified diastolic (congestive) heart failure: Secondary | ICD-10-CM

## 2017-03-27 DIAGNOSIS — R0902 Hypoxemia: Secondary | ICD-10-CM

## 2017-03-27 LAB — GLUCOSE, CAPILLARY
GLUCOSE-CAPILLARY: 172 mg/dL — AB (ref 65–99)
GLUCOSE-CAPILLARY: 122 mg/dL — AB (ref 65–99)
GLUCOSE-CAPILLARY: 155 mg/dL — AB (ref 65–99)
Glucose-Capillary: 118 mg/dL — ABNORMAL HIGH (ref 65–99)

## 2017-03-27 LAB — BASIC METABOLIC PANEL
ANION GAP: 6 (ref 5–15)
BUN: 11 mg/dL (ref 6–20)
CO2: 37 mmol/L — AB (ref 22–32)
Calcium: 9.2 mg/dL (ref 8.9–10.3)
Chloride: 94 mmol/L — ABNORMAL LOW (ref 101–111)
Creatinine, Ser: 0.58 mg/dL (ref 0.44–1.00)
GFR calc Af Amer: >60 mL/min (ref >60)
GFR calc non Af Amer: >60 mL/min (ref >60)
GLUCOSE: 126 mg/dL — AB (ref 65–99)
POTASSIUM: 3.9 mmol/L (ref 3.5–5.1)
Sodium: 137 mmol/L (ref 135–145)

## 2017-03-27 LAB — CBC
HEMATOCRIT: 40.5 % (ref 36.0–46.0)
HEMOGLOBIN: 12.8 g/dL (ref 12.0–15.0)
MCH: 31.5 pg (ref 26.0–34.0)
MCHC: 31.6 g/dL (ref 30.0–36.0)
MCV: 99.8 fL (ref 78.0–100.0)
Platelets: 505 10*3/uL — ABNORMAL HIGH (ref 150–400)
RBC: 4.06 MIL/uL (ref 3.87–5.11)
RDW: 13.2 % (ref 11.5–15.5)
WBC: 6.8 10*3/uL (ref 4.0–10.5)

## 2017-03-27 LAB — BRAIN NATRIURETIC PEPTIDE: B NATRIURETIC PEPTIDE 5: 148.7 pg/mL — AB (ref 0.0–100.0)

## 2017-03-27 MED ORDER — DILTIAZEM HCL ER COATED BEADS 240 MG PO CP24
240.0000 mg | ORAL_CAPSULE | Freq: Every day | ORAL | Status: DC
  Administered 2017-03-27 – 2017-03-28 (×2): 240 mg via ORAL
  Filled 2017-03-27 (×2): qty 1

## 2017-03-27 MED ORDER — IPRATROPIUM-ALBUTEROL 0.5-2.5 (3) MG/3ML IN SOLN
3.0000 mL | Freq: Four times a day (QID) | RESPIRATORY_TRACT | Status: DC
  Administered 2017-03-27: 3 mL via RESPIRATORY_TRACT
  Filled 2017-03-27: qty 3

## 2017-03-27 MED ORDER — FUROSEMIDE 80 MG PO TABS
80.0000 mg | ORAL_TABLET | Freq: Every day | ORAL | Status: DC
  Administered 2017-03-27 – 2017-03-28 (×2): 80 mg via ORAL
  Filled 2017-03-27 (×2): qty 1

## 2017-03-27 MED ORDER — IPRATROPIUM-ALBUTEROL 0.5-2.5 (3) MG/3ML IN SOLN
3.0000 mL | Freq: Three times a day (TID) | RESPIRATORY_TRACT | Status: DC
  Administered 2017-03-28 (×2): 3 mL via RESPIRATORY_TRACT
  Filled 2017-03-27 (×2): qty 3

## 2017-03-27 NOTE — Progress Notes (Signed)
Pt transitioned to regular nasal cannula prior to working with PT. Pt O2 saturations 92-98% on 4L nasal cannula.   Fritz Pickerel, RN

## 2017-03-27 NOTE — Progress Notes (Signed)
Pt turned down to 2L/min per attending team request. Upon turning pt down to 5L initially desat to 84 - pt able to increase to 89 via deep breathing. Turned down 2L HFNC - O2 sat remaining at 89%.  Pt began talking on telephone on 2L HFNC. Decreased to 80-84% O2 saturation.  Increased to 4L HFNC. O2 saturations increased to 88 percent with deep breathing. Pt remains on 4L HFNC. Will update attending team.    Fritz Pickerel, RN

## 2017-03-27 NOTE — Progress Notes (Signed)
Pt remains on 10L high flow nasal cannula. RN remained in room while pt was on bedpan. O2 saturations dropped to 85%. Pt states she did not go to the bathroom yesterday so she was "trying hard to go today". Pt instructed on deep breathing. Encouraged not to strain. Pt asymptomatic despite O2 saturations as low as 85%. O2 saturation returned to 90% off of bedpan.  Fritz Pickerel, RN

## 2017-03-27 NOTE — Progress Notes (Signed)
Family Medicine Teaching Service Daily Progress Note Intern Pager: [phone]  Patient name: Karen Jackson Medical record number: [id number] Date of birth: 05-Jul-1941 Age: 76 y.o. Gender: female  Primary Care Provider: Zenia Resides, MD Consultants: none Code Status: FULL  Pt Overview and Major Events to Date:  Admitted to telemetry on 7/23 with PNA vs UTI and O2 requirement. Newly discovered atrial fibrillation with RVR on the night of 7/23.  Febrile to 101.9 on 7/24, responded to tylenol.  Intermittent requirement of BiPAP. New finding of dCHF, likely exacerbated by afib, cards on board. Resp status has been responding to lasix.   Assessment and Plan: Karen Jackson is a 76 y.o. female presenting with cough and SOB of five days' duration. PMH is significant for hemorrhagic stroke in 2013 with residual left sided weakness, HTN, HLD, Type 2 DM, hypomagnesemia, urinary frequency, GERD, and OA.  Bilateral pneumonia: Improving. No home O2 requirement, now requiring HFNC and BiPAP. S/p CTX and azithro 7/23>7/28. Completed course of azithro. Continuing ceftriaxone.  -transition ceftriaxone to Suprax for PO abx, will be done with course on 8/1 -Tylenol PRN fever -continuous pulse ox -vitals per unit routine -HFNC as needed  Acute on Chronic Diastolic CHF: new diagnosis for this patient. EF 60-65% with G3DD. Cards following. Responds well to IV lasix. Fluid overload likely contributed to her respiratory distress initially.  -continue daily lasix IV 40mg , will transition to PO according to cards recs -tele -daily BMP -changed IV Lasix 40 mg to Lasix 80 mg PO daily  New-onset atrial fibrillation with RVR: A fib in the setting of infection vs new independent finding. CHA2DS2-VASc Score = 7 (11.2% stroke rate/year).  Patient in NSR.  -cardiology signed off on 03/26/17 -changed diltiazem 60 mg Q6H to diltiazem CD 240 mg daily per cardiology -continue metoprolol at 50 mg BID -after discussion with  neurologist Dr. Leonie Man, will withhold anticoagulation except for ASA due to patient's hx of hemorrhagic stroke caused by primary HTN -continue cardiac monitoring  Hx of hemorrhagic stroke:  Left arm and leg are largely immobile, and patient reports increased swelling in the left leg and foot.  She is not currently on anticoagulation due to hemorrhagic stroke.  MRI negative for acute intracranial abnormality, positive for remote hemorrhagic stroke in posterior limb of right internal capsule and lateral thalamus. -continue gabapentin 100 mg TID -withholding anticoagulation, prescribed ASA only given hx of primary hemorrhagic stroke  HTN:  Home meds were norvasc and losartan-HCTZ. Given new onset afib above, BP now controlled with dilt and metoprolol.  -monitor BPs  -continue diltiazem and metoprolol as above -losartan was held due to hyperkalemia on 7/28  HLD:  Takes Lipitor 20 mg daily -continue home Lipitor  Type 2 DM:  Last A1C in April was 6.6.  A1C measured during this hospital stay is 6.6. Used 6U SSI overnight. -sensitive SSI  GERD:  Takes Prilosec 20 mg at home -Protonix 40 mg daily  Hypomagnesemia.  At home on 400 mg po daily -Continue home Mag   OA:  Stable. Takes Celebrex 200 mg at home -continue home Celebrex  FEN/GI: carb modified, protonix Prophylaxis: Lovenox  Disposition: SNF once stable, anticipate going tomorrow  Subjective:  Patient says she had a good night last night and that she continues to feel better.  She is hoping to go to a SNF today.  She says that PT worked with her last night and that she was unable to support her weight with her left foot, which  is a sharp decline of function from her baseline.  Objective: Temp:  [97.9 F (36.6 C)-98.6 F (37 C)] 97.9 F (36.6 C) (07/31 0453) Pulse Rate:  [49-70] 70 (07/31 0600) Resp:  [16-24] 20 (07/31 0600) BP: (109-133)/(50-82) 123/62 (07/31 0453) SpO2:  [91 %-96 %] 94 % (07/31 0600) Weight:  [188 lb  7.9 oz (85.5 kg)] 188 lb 7.9 oz (85.5 kg) (07/31 0453) Physical Exam: General: lying in bed w HFNC, appears relaxed, no increased work of breathing Cardiovascular: sinus rhythm, normal rate, no murmur Respiratory: CTAB, no crackles heard Abdomen: nontender to palpation, +bowel sounds Extremities: nonpitting, minimal edema in bilateral lower extremities  Laboratory:  Recent Labs Lab 03/24/17 0244 03/26/17 0730 03/27/17 0557  WBC 7.9 7.8 6.8  HGB 12.2 13.2 12.8  HCT 38.6 41.0 40.5  PLT 377 489* 505*    Recent Labs Lab 03/24/17 0244 03/25/17 0323 03/26/17 0730  NA 134* 137 137  K 6.0* 4.7 3.8  CL 97* 95* 94*  CO2 29 31 32  BUN 17 12 12   CREATININE 0.60 0.57 0.52  CALCIUM 8.4* 9.3 9.3  GLUCOSE 140* 154* 132*     Imaging/Diagnostic Tests: Ct Angio Chest Pe W Or Wo Contrast  Result Date: 03/19/2017 CLINICAL DATA:  Respiratory failure EXAM: CT ANGIOGRAPHY CHEST WITH CONTRAST TECHNIQUE: Multidetector CT imaging of the chest was performed using the standard protocol during bolus administration of intravenous contrast. Multiplanar CT image reconstructions and MIPs were obtained to evaluate the vascular anatomy. CONTRAST:  75 mL Isovue 370 intravenous COMPARISON:  03/19/2017 FINDINGS: Cardiovascular: Satisfactory opacification of the pulmonary arteries to the segmental level. No evidence of pulmonary embolism. Enlarged pulmonary artery trunk, measuring 3.6 cm. Non aneurysmal aorta. No dissection. Atherosclerotic calcification. Coronary artery calcification. Borderline to mild cardiomegaly. No pericardial effusion. Mediastinum/Nodes: Midline trachea. Nonspecific subcentimeter mediastinal lymph nodes. No thyroid mass. Esophagus within normal limits. Lungs/Pleura: Dependent atelectasis in the posterior lung bases. Scattered hazy densities bilaterally may reflect edema or diffuse atelectasis. More focal area of consolidation with surrounding ground-glass density in the left upper lobe may  relate to a pneumonia. Upper Abdomen: No acute abnormality. Surgical clips in the right upper quadrant and between the liver and stomach. Musculoskeletal: Degenerative changes. No acute or suspicious bone lesion. Review of the MIP images confirms the above findings. IMPRESSION: 1. Negative for acute pulmonary embolus. 2. Scattered foci of hazy density suggesting diffuse atelectasis or edema. Focal area of mild consolidation and ground-glass density in the left upper lobe is suspicious for focal pneumonia. 3. Enlarged main pulmonary artery trunk suggests pulmonary hypertension. Aortic Atherosclerosis (ICD10-I70.0). Electronically Signed   By: Donavan Foil M.D.   On: 03/19/2017 22:22   Dg Chest Port 1 View  Result Date: 03/20/2017 CLINICAL DATA:  Acute respiratory distress. EXAM: PORTABLE CHEST 1 VIEW COMPARISON:  Radiographs and CTA yesterday. FINDINGS: Low lung volumes persist. Atherosclerosis of the aortic arch. Linear atelectasis in the right midlung versus fluid in the fissure. Worsening diffuse hazy opacities suspicious for pulmonary edema. Ground-glass opacity in the left upper lobe on CT is only faintly visualized. No large pleural effusion. No pneumothorax. IMPRESSION: Low lung volumes. Worsening diffuse hazy opacities suspicious for increasing pulmonary edema. Ground-glass opacity in the left upper lobe on CT is only faintly visualized radiographically. Electronically Signed   By: Jeb Levering M.D.   On: 03/20/2017 03:30   Dg Chest Portable 1 View  Result Date: 03/19/2017 CLINICAL DATA:  Shortness of breath EXAM: PORTABLE CHEST 1 VIEW COMPARISON:  07/13/2010  FINDINGS: Cardiac shadow is stable. Patient is somewhat rotated to the right accentuating the mediastinum markings. Lungs are well aerated bilaterally. Minimal right basilar atelectasis is seen. Old rib fractures are noted on the left and stable. IMPRESSION: Minimal right basilar atelectasis.  No acute abnormality noted. Electronically  Signed   By: Inez Catalina M.D.   On: 03/19/2017 13:34    Kathrene Alu, MD 03/27/2017, 7:02 AM PGY-1, [place] Intern pager: [phone], text pages welcome

## 2017-03-27 NOTE — Discharge Summary (Signed)
Karen Jackson Discharge Summary  Patient name: Karen Jackson Medical record number: [id number] Date of birth: 1941-04-21 Age: 76 y.o. Gender: female Date of Admission: 03/19/2017  Date of Discharge: 03/28/17 Admitting Physician: Karen Resides, MD  Primary Care Provider: Zenia Resides, MD Consultants: Cardiology  Indication for Hospitalization: pneumonia, new onset atrial fibrillation with RVR, HFpEF  Discharge Diagnoses/Problem List:  1.  Multifocal pneumonia: treated with azithromycin for 5 days and ceftriaxone for 10 days.  Has had multiple desaturations into the low 80s with need for BiPAP and HFNC up to 10L O2.  Currently on duoneb TID, 3 mL and on 4L oxygen via [place].  2.  Atrial fibrillation with RVR: could be a result of acute lung infection.  Rate controlled with metoprolol 50 mg BID and diltiazem CD 240 mg daily.  Not on anticoagulation, only ASA 81 mg due to history of hemorrhagic stroke.  3.  HFpEF:  Lasix 80 mg daily for fluid overload, metoprolol tartrate 50 mg BID for pressure control   Disposition: [facility]  Discharge Condition: stable, improving  Discharge Exam: please see progress note from day of discharge  Brief Jackson Course:  Karen Jackson was admitted on 03/19/17 for a 2 week history of shortness of breath.  CTA excluded PE.  She was found to have atrial fibrillation with RVR and was put on a Cardizem drip and metoprolol 50 mg BID, which converted her to sinus rhythm.  She had multiple episodes of atrial fibrillation during her Jackson stay, but her heart rate and rhythm stabilized by 7/30, and she was transitioned to Cardizem CD 240 mg daily.  She was continued on metoprolol 50 mg BID for this.  Echo showed grade 3 diastolic dysfunction, and her home Lasix dose was increased to 40 mg IV daily.  She was transitioned to 80 mg PO Lasix on 7/31.  She was treated for possible pneumonia on admission with ceftriaxone and azithromycin, and a  multifocal pneumonia was confirmed on CXR on 7/25.  She finished her course of azithromycin on 7/28, and she was transitioned to Suprax from ceftriaxone on 7/31.  She will finish Suprax on 8/1.  She has had multiple desaturations during the nighttime, requiring BiPAP and HFNC up to 10 L.  She was gradually weaned down to 4 L [place] by 8/1.    Issues for Follow Up:  1. Patient would benefit from outpatient sleep study due to frequent nightly desaturations - obstructive sleep apnea is a likely contributor to this issue. 2. Continue to adjust Lasix dose depending on patient's fluid status.  She may be able to be weaned down to 40 mg PO daily, but was kept on 80 mg at discharge due to the possible edema seen on her 7/31 CXR. 3. Respiratory status - patient will need oxygen via [place], and this will need to be titrated depending on her shortness of breath 4. Deconditioning - patient is much weaker at discharge after her illness, which is in addition to her chronic left sided weakness due to her stroke in 2013.  She will need significant physical therapy after discharge. 5. HTN - patient has been normotensive to hypotensive during hospitalization, and her home Norvasc and Losartan were discontinued.  Can consider restarting these medications outpatient if needed.  Significant Procedures: none  Significant Labs and Imaging:   Recent Labs Lab 03/26/17 0730 03/27/17 0557 03/28/17 0358  WBC 7.8 6.8 6.0  HGB 13.2 12.8 12.7  HCT 41.0 40.5  39.2  PLT 489* 505* 494*    Recent Labs Lab 03/24/17 0244 03/25/17 0323 03/26/17 0730 03/27/17 0557 03/28/17 0358  NA 134* 137 137 137 135  K 6.0* 4.7 3.8 3.9 3.6  CL 97* 95* 94* 94* 91*  CO2 29 31 32 37* 36*  GLUCOSE 140* 154* 132* 126* 131*  BUN 17 12 12 11 9   CREATININE 0.60 0.57 0.52 0.58 0.51  CALCIUM 8.4* 9.3 9.3 9.2 8.9   Dg Chest Port 1 View  Result Date: 03/27/2017 CLINICAL DATA:  Hypoxemia. EXAM: PORTABLE CHEST 1 VIEW COMPARISON:  03/23/2017  FINDINGS: The cardiac silhouette remains enlarged. Aortic atherosclerosis is noted. Bilateral perihilar and asymmetric left basilar opacities are again seen. More focal area of dense consolidation in the left lower lobe has significantly improved. No new airspace consolidation, sizeable pleural effusion, or pneumothorax is identified. Bilateral glenohumeral osteoarthrosis is noted. IMPRESSION: 1. Improved left lower lobe aeration. 2. Persistent left greater than right perihilar and basilar opacities which may reflect pneumonia or edema. Electronically Signed   By: Logan Bores M.D.   On: 03/27/2017 14:51    Ct Angio Chest Pe W Or Wo Contrast  Result Date: 03/19/2017 CLINICAL DATA:  Respiratory failure EXAM: CT ANGIOGRAPHY CHEST WITH CONTRAST TECHNIQUE: Multidetector CT imaging of the chest was performed using the standard protocol during bolus administration of intravenous contrast. Multiplanar CT image reconstructions and MIPs were obtained to evaluate the vascular anatomy. CONTRAST:  75 mL Isovue 370 intravenous COMPARISON:  03/19/2017 FINDINGS: Cardiovascular: Satisfactory opacification of the pulmonary arteries to the segmental level. No evidence of pulmonary embolism. Enlarged pulmonary artery trunk, measuring 3.6 cm. Non aneurysmal aorta. No dissection. Atherosclerotic calcification. Coronary artery calcification. Borderline to mild cardiomegaly. No pericardial effusion. Mediastinum/Nodes: Midline trachea. Nonspecific subcentimeter mediastinal lymph nodes. No thyroid mass. Esophagus within normal limits. Lungs/Pleura: Dependent atelectasis in the posterior lung bases. Scattered hazy densities bilaterally may reflect edema or diffuse atelectasis. More focal area of consolidation with surrounding ground-glass density in the left upper lobe may relate to a pneumonia. Upper Abdomen: No acute abnormality. Surgical clips in the right upper quadrant and between the liver and stomach. Musculoskeletal:  Degenerative changes. No acute or suspicious bone lesion. Review of the MIP images confirms the above findings. IMPRESSION: 1. Negative for acute pulmonary embolus. 2. Scattered foci of hazy density suggesting diffuse atelectasis or edema. Focal area of mild consolidation and ground-glass density in the left upper lobe is suspicious for focal pneumonia. 3. Enlarged main pulmonary artery trunk suggests pulmonary hypertension. Aortic Atherosclerosis (ICD10-I70.0). Electronically Signed   By: Donavan Foil M.D.   On: 03/19/2017 22:22   Dg Chest Port 1 View  Result Date: 03/20/2017 CLINICAL DATA:  Acute respiratory distress. EXAM: PORTABLE CHEST 1 VIEW COMPARISON:  Radiographs and CTA yesterday. FINDINGS: Low lung volumes persist. Atherosclerosis of the aortic arch. Linear atelectasis in the right midlung versus fluid in the fissure. Worsening diffuse hazy opacities suspicious for pulmonary edema. Ground-glass opacity in the left upper lobe on CT is only faintly visualized. No large pleural effusion. No pneumothorax. IMPRESSION: Low lung volumes. Worsening diffuse hazy opacities suspicious for increasing pulmonary edema. Ground-glass opacity in the left upper lobe on CT is only faintly visualized radiographically. Electronically Signed   By: Jeb Levering M.D.   On: 03/20/2017 03:30   Dg Chest Portable 1 View  Result Date: 03/19/2017 CLINICAL DATA:  Shortness of breath EXAM: PORTABLE CHEST 1 VIEW COMPARISON:  07/13/2010 FINDINGS: Cardiac shadow is stable. Patient is somewhat rotated  to the right accentuating the mediastinum markings. Lungs are well aerated bilaterally. Minimal right basilar atelectasis is seen. Old rib fractures are noted on the left and stable. IMPRESSION: Minimal right basilar atelectasis.  No acute abnormality noted. Electronically Signed   By: Inez Catalina M.D.   On: 03/19/2017 13:34   Results/Tests Pending at Time of Discharge: none  Discharge Medications:  Allergies as of 03/28/2017       Reactions   Codeine Phosphate    REACTION: unspecified   Simvastatin    REACTION: myalgias      Medication List    STOP taking these medications   amLODipine 10 MG tablet Commonly known as:  NORVASC   losartan-hydrochlorothiazide 100-12.5 MG tablet Commonly known as:  HYZAAR     TAKE these medications   aspirin 81 MG EC tablet Take 1 tablet (81 mg total) by mouth daily.   atorvastatin 20 MG tablet Commonly known as:  LIPITOR TAKE 1 TABLET BY MOUTH EVERY DAY   Calcium Carbonate-Vitamin D3 600-400 MG-UNIT Tabs Take 1 tablet by mouth daily.   celecoxib 200 MG capsule Commonly known as:  CELEBREX TAKE 1 CAPSULE BY MOUTH EVERY DAY WITH FOOD   diclofenac sodium 1 % Gel Commonly known as:  VOLTAREN APPLY 2 GRAMS EXTERNALLY TO THE AFFECTED AREA FOUR TIMES DAILY   diltiazem 240 MG 24 hr capsule Commonly known as:  CARDIZEM CD Take 1 capsule (240 mg total) by mouth daily.   furosemide 80 MG tablet Commonly known as:  LASIX Take 1 tablet (80 mg total) by mouth daily.   gabapentin 100 MG capsule Commonly known as:  NEURONTIN TAKE 1 CAPSULE BY MOUTH THREE TIMES DAILY   ipratropium-albuterol 0.5-2.5 (3) MG/3ML Soln Commonly known as:  DUONEB Take 3 mLs by nebulization 3 (three) times daily.   MAGNESIUM-OXIDE 400 (241.3 Mg) MG tablet Generic drug:  magnesium oxide TAKE 1 TABLET BY MOUTH DAILY   metoprolol tartrate 50 MG tablet Commonly known as:  LOPRESSOR Take 1 tablet (50 mg total) by mouth 2 (two) times daily.   omeprazole 20 MG capsule Commonly known as:  PRILOSEC TAKE 1 CAPSULE BY MOUTH EVERY DAY   traZODone 50 MG tablet Commonly known as:  DESYREL TAKE 1/2 TO 1 TABLET BY MOUTH AT BEDTIME AS NEEDED FOR SLEEP            Durable Medical Equipment        Start     Ordered   03/28/17 0000  For home use only DME oxygen    Question Answer Comment  Mode or (Route) Nasal cannula   Liters per Minute 4   Frequency Continuous (stationary and portable  oxygen unit needed)   Oxygen delivery system Gas      03/28/17 1229      Discharge Instructions: Please refer to Patient Instructions section of EMR for full details.  Patient was counseled important signs and symptoms that should prompt return to medical care, changes in medications, dietary instructions, activity restrictions, and follow up appointments.   Follow-Up Appointments: Contact information for after-discharge care    Destination    [facility] .   Specialty:  [place] information: [address] [place] [phone]              Kathrene Alu, MD 03/28/2017, 12:30 PM PGY-1, [place]

## 2017-03-27 NOTE — Progress Notes (Signed)
Physical Therapy Treatment Patient Details Name: Karen Jackson MRN: [id number] DOB: Aug 27, 1941 Today's Date: 03/27/2017    History of Present Illness Pt is a 76 y/o female admitted secondary to cough and SOB. Pt with worsening respiratory function and went into acute respiratory failure requiring supplemental O2 and BiPAP. Pt now on 4L of O2 via [place]. Pt also with new onset a-fib with RVR. PMH including but not limited to HTN, DM, and hx of hemorrhagic CVA with residual L sided weakness.    PT Comments    Pt is making good progress towards her goals. Pt currently modA for bed mobility and minAx2 for transfers and ambulation of 5 feet with RW. Pt O2 continues to desaturate on 6L O2 via nasal cannula however quickly recovered to levels in the low 90%s within minutes. Pt requires skilled PT to continue to progress gait training and to improve LE strength and endurance to safely navigate in her discharge environment.    Follow Up Recommendations  SNF     Equipment Recommendations  None recommended by PT       Precautions / Restrictions Precautions Precautions: Fall Precaution Comments: watch SPO2 Restrictions Weight Bearing Restrictions: No    Mobility  Bed Mobility Overal bed mobility: Needs Assistance Bed Mobility: Supine to Sit     Supine to sit: HOB elevated;Mod assist     General bed mobility comments: assist to elevate trunk into sitting with use of bed rail and use of bed pad to scoot hips toward EOB; cues for sequencing  Transfers Overall transfer level: Needs assistance Equipment used: Rolling walker (2 wheeled) Transfers: Sit to/from Stand Sit to Stand: Min assist;+2 safety/equipment         General transfer comment: minAx2 for powerup and steadying in RW, vc for hand placement for powerup  Ambulation/Gait Ambulation/Gait assistance: Min assist;+2 safety/equipment Ambulation Distance (Feet): 5 Feet Assistive device: Rolling walker (2 wheeled) Gait  Pattern/deviations: Decreased dorsiflexion - left;Decreased weight shift to left;Trunk flexed;Step-to pattern;Decreased step length - left Gait velocity: slowed Gait velocity interpretation: Below normal speed for age/gender General Gait Details: pt able to take steps forward with maximal vc from pt to her L foot to move forward, once pt L LE fatigued and no longer able to advance L LE pt begins to become anxious and impulsive despite maintaining steady static standing as recliner placed behind her.          Balance Overall balance assessment: Needs assistance Sitting-balance support: Feet supported Sitting balance-Leahy Scale: Good     Standing balance support: During functional activity;Bilateral upper extremity supported Standing balance-Leahy Scale: Fair Standing balance comment: able to stand with RW assist                            Cognition Arousal/Alertness: Awake/alert Behavior During Therapy: WFL for tasks assessed/performed Overall Cognitive Status: Within Functional Limits for tasks assessed                                        Exercises General Exercises - Lower Extremity Long Arc Quad:  (2 sets) Hip Flexion/Marching: AROM;Both;15 reps (2 sets)    General Comments General comments (skin integrity, edema, etc.): Pt on 6L/min O2 via HFNC at entry, nursing replaced with regular nasal cannula, prior to ambulation BP 122/61, HR 70bpm, SaO2 90%O2, with ambulation SaO2 dropped to 78%O2 and  HR rose to 125 bpm, with sitting and vc for pursed lipped breathing SaO2 rebounded to 93%O2, after ambulation BP 132/56, HR 71bpm, and SaO2 93%O2      Pertinent Vitals/Pain Pain Assessment: No/denies pain           PT Goals (current goals can now be found in the care plan section) Acute Rehab PT Goals Patient Stated Goal: to return to PLOF PT Goal Formulation: With patient Time For Goal Achievement: 04/04/17 Potential to Achieve Goals:  Good Progress towards PT goals: Progressing toward goals    Frequency    Min 3X/week      PT Plan Current plan remains appropriate       AM-PAC PT "6 Clicks" Daily Activity  Outcome Measure  Difficulty turning over in bed (including adjusting bedclothes, sheets and blankets)?: Total Difficulty moving from lying on back to sitting on the side of the bed? : Total Difficulty sitting down on and standing up from a chair with arms (e.g., wheelchair, bedside commode, etc,.)?: Total Help needed moving to and from a bed to chair (including a wheelchair)?: A Lot Help needed walking in hospital room?: A Lot Help needed climbing 3-5 steps with a railing? : Total 6 Click Score: 8    End of Session Equipment Utilized During Treatment: Gait belt;Oxygen Activity Tolerance: Patient limited by fatigue Patient left: with call bell/phone within reach;in chair;with family/visitor present Nurse Communication: Mobility status PT Visit Diagnosis: Other abnormalities of gait and mobility (R26.89)     Time: 0211-1735 PT Time Calculation (min) (ACUTE ONLY): 30 min  Charges:  $Gait Training: 8-22 mins $Therapeutic Activity: 8-22 mins                    G Codes:        B. Migdalia Dk PT, DPT Acute Rehabilitation  [phone] Pager [phone] [place] 03/27/2017, 3:33 PM

## 2017-03-28 DIAGNOSIS — I10 Essential (primary) hypertension: Secondary | ICD-10-CM | POA: Diagnosis not present

## 2017-03-28 DIAGNOSIS — R0603 Acute respiratory distress: Secondary | ICD-10-CM | POA: Diagnosis not present

## 2017-03-28 DIAGNOSIS — J9690 Respiratory failure, unspecified, unspecified whether with hypoxia or hypercapnia: Secondary | ICD-10-CM | POA: Diagnosis not present

## 2017-03-28 DIAGNOSIS — I482 Chronic atrial fibrillation: Secondary | ICD-10-CM | POA: Diagnosis not present

## 2017-03-28 DIAGNOSIS — Z8673 Personal history of transient ischemic attack (TIA), and cerebral infarction without residual deficits: Secondary | ICD-10-CM | POA: Diagnosis not present

## 2017-03-28 DIAGNOSIS — E119 Type 2 diabetes mellitus without complications: Secondary | ICD-10-CM | POA: Diagnosis not present

## 2017-03-28 DIAGNOSIS — I69354 Hemiplegia and hemiparesis following cerebral infarction affecting left non-dominant side: Secondary | ICD-10-CM | POA: Diagnosis not present

## 2017-03-28 DIAGNOSIS — J9610 Chronic respiratory failure, unspecified whether with hypoxia or hypercapnia: Secondary | ICD-10-CM | POA: Diagnosis not present

## 2017-03-28 DIAGNOSIS — E785 Hyperlipidemia, unspecified: Secondary | ICD-10-CM | POA: Diagnosis not present

## 2017-03-28 DIAGNOSIS — I480 Paroxysmal atrial fibrillation: Secondary | ICD-10-CM | POA: Diagnosis not present

## 2017-03-28 DIAGNOSIS — I5031 Acute diastolic (congestive) heart failure: Secondary | ICD-10-CM | POA: Diagnosis not present

## 2017-03-28 DIAGNOSIS — J189 Pneumonia, unspecified organism: Secondary | ICD-10-CM | POA: Diagnosis not present

## 2017-03-28 DIAGNOSIS — M8580 Other specified disorders of bone density and structure, unspecified site: Secondary | ICD-10-CM | POA: Diagnosis not present

## 2017-03-28 DIAGNOSIS — K219 Gastro-esophageal reflux disease without esophagitis: Secondary | ICD-10-CM | POA: Diagnosis not present

## 2017-03-28 DIAGNOSIS — K5790 Diverticulosis of intestine, part unspecified, without perforation or abscess without bleeding: Secondary | ICD-10-CM | POA: Diagnosis not present

## 2017-03-28 DIAGNOSIS — Z9181 History of falling: Secondary | ICD-10-CM | POA: Diagnosis not present

## 2017-03-28 DIAGNOSIS — I4891 Unspecified atrial fibrillation: Secondary | ICD-10-CM | POA: Diagnosis not present

## 2017-03-28 DIAGNOSIS — E559 Vitamin D deficiency, unspecified: Secondary | ICD-10-CM | POA: Diagnosis not present

## 2017-03-28 DIAGNOSIS — N3000 Acute cystitis without hematuria: Secondary | ICD-10-CM | POA: Diagnosis not present

## 2017-03-28 DIAGNOSIS — E7800 Pure hypercholesterolemia, unspecified: Secondary | ICD-10-CM | POA: Diagnosis not present

## 2017-03-28 DIAGNOSIS — M1990 Unspecified osteoarthritis, unspecified site: Secondary | ICD-10-CM | POA: Diagnosis not present

## 2017-03-28 DIAGNOSIS — I509 Heart failure, unspecified: Secondary | ICD-10-CM | POA: Diagnosis not present

## 2017-03-28 LAB — CBC
HCT: 39.2 % (ref 36.0–46.0)
Hemoglobin: 12.7 g/dL (ref 12.0–15.0)
MCH: 31.7 pg (ref 26.0–34.0)
MCHC: 32.4 g/dL (ref 30.0–36.0)
MCV: 97.8 fL (ref 78.0–100.0)
PLATELETS: 494 10*3/uL — AB (ref 150–400)
RBC: 4.01 MIL/uL (ref 3.87–5.11)
RDW: 12.7 % (ref 11.5–15.5)
WBC: 6 10*3/uL (ref 4.0–10.5)

## 2017-03-28 LAB — GLUCOSE, CAPILLARY
GLUCOSE-CAPILLARY: 122 mg/dL — AB (ref 65–99)
GLUCOSE-CAPILLARY: 151 mg/dL — AB (ref 65–99)

## 2017-03-28 LAB — BASIC METABOLIC PANEL
Anion gap: 8 (ref 5–15)
BUN: 9 mg/dL (ref 6–20)
CALCIUM: 8.9 mg/dL (ref 8.9–10.3)
CHLORIDE: 91 mmol/L — AB (ref 101–111)
CO2: 36 mmol/L — ABNORMAL HIGH (ref 22–32)
CREATININE: 0.51 mg/dL (ref 0.44–1.00)
Glucose, Bld: 131 mg/dL — ABNORMAL HIGH (ref 65–99)
Potassium: 3.6 mmol/L (ref 3.5–5.1)
SODIUM: 135 mmol/L (ref 135–145)

## 2017-03-28 MED ORDER — DILTIAZEM HCL ER COATED BEADS 240 MG PO CP24: 240.0000 mg | ORAL_CAPSULE | Freq: Every day | ORAL | 0 refills | Status: DC

## 2017-03-28 MED ORDER — IPRATROPIUM-ALBUTEROL 0.5-2.5 (3) MG/3ML IN SOLN: 3.0000 mL | Freq: Three times a day (TID) | RESPIRATORY_TRACT | 0 refills | Status: DC

## 2017-03-28 MED ORDER — ASPIRIN 81 MG PO TBEC: 81.0000 mg | DELAYED_RELEASE_TABLET | Freq: Every day | ORAL | 0 refills | Status: DC

## 2017-03-28 MED ORDER — METOPROLOL TARTRATE 50 MG PO TABS: 50.0000 mg | ORAL_TABLET | Freq: Two times a day (BID) | ORAL | 0 refills | Status: DC

## 2017-03-28 MED ORDER — FUROSEMIDE 80 MG PO TABS: 80.0000 mg | ORAL_TABLET | Freq: Every day | ORAL | 0 refills | Status: DC

## 2017-03-28 NOTE — Progress Notes (Signed)
Family Medicine Teaching Service Daily Progress Note Intern Pager: [phone]  Patient name: Karen Jackson Medical record number: [id number] Date of birth: 11-Jun-1941 Age: 76 y.o. Gender: female  Primary Care Provider: Zenia Resides, MD Consultants: none Code Status: FULL  Pt Overview and Major Events to Date:  Admitted to telemetry on 7/23 with PNA vs UTI and O2 requirement. Newly discovered atrial fibrillation with RVR on the night of 7/23.  Febrile to 101.9 on 7/24, responded to tylenol.  Intermittent requirement of BiPAP. New finding of dCHF, likely exacerbated by afib, cards on board. Resp status has been responding to lasix.   Assessment and Plan: Karen Jackson is a 76 y.o. female presenting with cough and SOB of five days' duration. PMH is significant for hemorrhagic stroke in 2013 with residual left sided weakness, HTN, HLD, Type 2 DM, hypomagnesemia, urinary frequency, GERD, and OA.  Bilateral pneumonia: Improving. No home O2 requirement, now requiring HFNC and BiPAP. S/p CTX and azithro 7/23>7/28. Completed course of azithro. Continuing ceftriaxone. CXR 7/31 showed improvement in lower lobe aeration, persistent left > right perihilar and basilar opacities.  Added flutter valve, increased frequency of duoneb treatments on 7/31. -transition ceftriaxone to Suprax for PO abx, will be done with course on 8/1 -Tylenol PRN fever -continuous pulse ox -vitals per unit routine -wean down to qualify for SNF placement  Acute on Chronic Diastolic CHF: new diagnosis for this patient. EF 60-65% with G3DD. Cards following. Responds well to IV lasix. Fluid overload likely contributed to her respiratory distress initially.  BNP 148.7 on 7/31 (decreased from 241 on admission) -continue daily lasix IV 40mg , will transition to PO according to cards recs -tele -daily BMP -continue Lasix 80   New-onset atrial fibrillation with RVR: A fib in the setting of infection vs new independent finding.  CHA2DS2-VASc Score = 7 (11.2% stroke rate/year).  Patient in NSR.  -cardiology signed off on 03/26/17 -changed diltiazem 60 mg Q6H to diltiazem CD 240 mg daily per cardiology -continue metoprolol at 50 mg BID -after discussion with neurologist Dr. Leonie Man, will withhold anticoagulation except for ASA due to patient's hx of hemorrhagic stroke caused by primary HTN -continue cardiac monitoring  Hx of hemorrhagic stroke:  Left arm and leg are largely immobile, and patient reports increased swelling in the left leg and foot.  She is not currently on anticoagulation due to hemorrhagic stroke.  MRI negative for acute intracranial abnormality, positive for remote hemorrhagic stroke in posterior limb of right internal capsule and lateral thalamus. -continue gabapentin 100 mg TID -withholding anticoagulation, prescribed ASA only given hx of primary hemorrhagic stroke  HTN:  Home meds were norvasc and losartan-HCTZ. Given new onset afib above, BP now controlled with dilt and metoprolol.  -monitor BPs  -continue diltiazem and metoprolol as above -losartan was held due to hyperkalemia on 7/28  HLD:  Takes Lipitor 20 mg daily -continue home Lipitor  Type 2 DM:  Last A1C in April was 6.6.  A1C measured during this hospital stay is 6.6. Used 6U SSI overnight. -sensitive SSI  GERD:  Takes Prilosec 20 mg at home -Protonix 40 mg daily  Hypomagnesemia.  At home on 400 mg po daily -Continue home Mag   OA:  Stable. Takes Celebrex 200 mg at home -continue home Celebrex  FEN/GI: carb modified, protonix Prophylaxis: Lovenox  Disposition: SNF once stable, anticipate going today  Subjective:  Patient says she had a good night last night and that she continues to feel better.  She  is hoping to go to a SNF today.  She says she is tolerating 4L O2 via [place] well this morning.  Objective: Temp:  [98 F (36.7 C)-98.6 F (37 C)] 98 F (36.7 C) (08/01 0400) Pulse Rate:  [61-76] 63 (08/01 0400) Resp:   [15-22] 18 (08/01 0400) BP: (117-132)/(54-66) 117/58 (08/01 0400) SpO2:  [89 %-95 %] 95 % (08/01 0400) Weight:  [184 lb 9.6 oz (83.7 kg)] 184 lb 9.6 oz (83.7 kg) (08/01 0400) Physical Exam: General: lying in bed w 4L O2 via [place], appears relaxed, no increased work of breathing Cardiovascular: sinus rhythm, normal rate, no murmur Respiratory: CTAB, no crackles heard Abdomen: nontender to palpation, +bowel sounds Extremities: nonpitting, minimal edema in bilateral lower extremities  Laboratory:  Recent Labs Lab 03/26/17 0730 03/27/17 0557 03/28/17 0358  WBC 7.8 6.8 6.0  HGB 13.2 12.8 12.7  HCT 41.0 40.5 39.2  PLT 489* 505* 494*    Recent Labs Lab 03/26/17 0730 03/27/17 0557 03/28/17 0358  NA 137 137 135  K 3.8 3.9 3.6  CL 94* 94* 91*  CO2 32 37* 36*  BUN 12 11 9   CREATININE 0.52 0.58 0.51  CALCIUM 9.3 9.2 8.9  GLUCOSE 132* 126* 131*     Imaging/Diagnostic Tests: Ct Angio Chest Pe W Or Wo Contrast  Result Date: 03/19/2017 CLINICAL DATA:  Respiratory failure EXAM: CT ANGIOGRAPHY CHEST WITH CONTRAST TECHNIQUE: Multidetector CT imaging of the chest was performed using the standard protocol during bolus administration of intravenous contrast. Multiplanar CT image reconstructions and MIPs were obtained to evaluate the vascular anatomy. CONTRAST:  75 mL Isovue 370 intravenous COMPARISON:  03/19/2017 FINDINGS: Cardiovascular: Satisfactory opacification of the pulmonary arteries to the segmental level. No evidence of pulmonary embolism. Enlarged pulmonary artery trunk, measuring 3.6 cm. Non aneurysmal aorta. No dissection. Atherosclerotic calcification. Coronary artery calcification. Borderline to mild cardiomegaly. No pericardial effusion. Mediastinum/Nodes: Midline trachea. Nonspecific subcentimeter mediastinal lymph nodes. No thyroid mass. Esophagus within normal limits. Lungs/Pleura: Dependent atelectasis in the posterior lung bases. Scattered hazy densities bilaterally may reflect  edema or diffuse atelectasis. More focal area of consolidation with surrounding ground-glass density in the left upper lobe may relate to a pneumonia. Upper Abdomen: No acute abnormality. Surgical clips in the right upper quadrant and between the liver and stomach. Musculoskeletal: Degenerative changes. No acute or suspicious bone lesion. Review of the MIP images confirms the above findings. IMPRESSION: 1. Negative for acute pulmonary embolus. 2. Scattered foci of hazy density suggesting diffuse atelectasis or edema. Focal area of mild consolidation and ground-glass density in the left upper lobe is suspicious for focal pneumonia. 3. Enlarged main pulmonary artery trunk suggests pulmonary hypertension. Aortic Atherosclerosis (ICD10-I70.0). Electronically Signed   By: Donavan Foil M.D.   On: 03/19/2017 22:22   Dg Chest Port 1 View  Result Date: 03/20/2017 CLINICAL DATA:  Acute respiratory distress. EXAM: PORTABLE CHEST 1 VIEW COMPARISON:  Radiographs and CTA yesterday. FINDINGS: Low lung volumes persist. Atherosclerosis of the aortic arch. Linear atelectasis in the right midlung versus fluid in the fissure. Worsening diffuse hazy opacities suspicious for pulmonary edema. Ground-glass opacity in the left upper lobe on CT is only faintly visualized. No large pleural effusion. No pneumothorax. IMPRESSION: Low lung volumes. Worsening diffuse hazy opacities suspicious for increasing pulmonary edema. Ground-glass opacity in the left upper lobe on CT is only faintly visualized radiographically. Electronically Signed   By: Jeb Levering M.D.   On: 03/20/2017 03:30   Dg Chest Portable 1 View  Result Date:  03/19/2017 CLINICAL DATA:  Shortness of breath EXAM: PORTABLE CHEST 1 VIEW COMPARISON:  07/13/2010 FINDINGS: Cardiac shadow is stable. Patient is somewhat rotated to the right accentuating the mediastinum markings. Lungs are well aerated bilaterally. Minimal right basilar atelectasis is seen. Old rib fractures  are noted on the left and stable. IMPRESSION: Minimal right basilar atelectasis.  No acute abnormality noted. Electronically Signed   By: Inez Catalina M.D.   On: 03/19/2017 13:34   Dg Chest Port 1 View  Result Date: 03/27/2017 CLINICAL DATA:  Hypoxemia. EXAM: PORTABLE CHEST 1 VIEW COMPARISON:  03/23/2017 FINDINGS: The cardiac silhouette remains enlarged. Aortic atherosclerosis is noted. Bilateral perihilar and asymmetric left basilar opacities are again seen. More focal area of dense consolidation in the left lower lobe has significantly improved. No new airspace consolidation, sizeable pleural effusion, or pneumothorax is identified. Bilateral glenohumeral osteoarthrosis is noted. IMPRESSION: 1. Improved left lower lobe aeration. 2. Persistent left greater than right perihilar and basilar opacities which may reflect pneumonia or edema. Electronically Signed   By: Logan Bores M.D.   On: 03/27/2017 14:51       Kathrene Alu, MD 03/28/2017, 7:08 AM PGY-1, [place] Intern pager: [phone], text pages welcome

## 2017-03-28 NOTE — Progress Notes (Signed)
Clinical Social Worker facilitated patient discharge including contacting patient family and facility to confirm patient discharge plans.  Clinical information faxed to facility and family agreeable with plan.  CSW arranged ambulance transport via [facility] to [facility] .  RN Ronalee Belts to call [phone] for report prior to discharge.  Clinical Social Worker will sign off for now as social work intervention is no longer needed. Please consult [place] again if new need arises.  Rhea Pink, MSW, [place]

## 2017-03-28 NOTE — Care Management Note (Signed)
Case Management Note Marvetta Gibbons RN, BSN Unit 4E-Case Manager [phone]  Patient Details  Name: Karen Jackson MRN: [id number] Date of Birth: November 28, 1940  Subjective/Objective:  Pt admitted with fever/SOB-PNA and HF, AFIb                  Action/Plan: PTA Pt lived at home alone, per PT/OT evaluations recommendations for SNF- pt agreeable and CSW following for SNF placement- pt has been using bipap at night and HFNC during day- CM will follow for d/c needs- CSW following for SNF needs.   Expected Discharge Date:  03/28/17               Expected Discharge Plan:  [place]  In-House Referral:  Clinical Social Work  Discharge planning Services  CM Consult  Post Acute Care Choice:  NA Choice offered to:  NA  DME Arranged:  N/A DME Agency:  NA  HH Arranged:  NA HH Agency:  NA  Status of Service:  Completed, signed off  If discussed at [place] of Stay Meetings, dates discussed:    Discharge Disposition: skilled facility   Additional Comments:  03/28/17- 41-   RN, CM- pt for d/c today to SNF- has been able to be weaned off [place] to [place] 4L- CSW following for placement needs-   Dahlia Client, Romeo Rabon, RN 03/28/2017, 12:37 PM

## 2017-03-29 ENCOUNTER — Telehealth (HOSPITAL_COMMUNITY): Payer: Self-pay | Admitting: *Deleted

## 2017-03-29 NOTE — Telephone Encounter (Signed)
LMOM-pt was referred to afib clinic from hospital

## 2017-03-30 DIAGNOSIS — I509 Heart failure, unspecified: Secondary | ICD-10-CM | POA: Diagnosis not present

## 2017-03-30 DIAGNOSIS — E785 Hyperlipidemia, unspecified: Secondary | ICD-10-CM | POA: Diagnosis not present

## 2017-03-30 DIAGNOSIS — I480 Paroxysmal atrial fibrillation: Secondary | ICD-10-CM | POA: Diagnosis not present

## 2017-03-30 DIAGNOSIS — J189 Pneumonia, unspecified organism: Secondary | ICD-10-CM | POA: Diagnosis not present

## 2017-04-11 ENCOUNTER — Telehealth: Payer: Self-pay | Admitting: *Deleted

## 2017-04-11 NOTE — Telephone Encounter (Signed)
LVM for pt to call the office. If she calls, please inform her of the information below. Sharon T Saunders, CMA  

## 2017-04-11 NOTE — Telephone Encounter (Signed)
Called and advised that the meds were given in the hospital and yes I wanted her to be taking them.  She should pick up the metoprolol and furosemide refills.

## 2017-04-11 NOTE — Telephone Encounter (Signed)
Patient called stating refills for metoprolol and lasix was sent to her pharmacy, however she has not had the medications in a while.  Does she need to continue the medications. Please advise.  Derl Barrow, RN

## 2017-04-12 DIAGNOSIS — I4891 Unspecified atrial fibrillation: Secondary | ICD-10-CM | POA: Diagnosis not present

## 2017-04-12 DIAGNOSIS — J9610 Chronic respiratory failure, unspecified whether with hypoxia or hypercapnia: Secondary | ICD-10-CM | POA: Diagnosis not present

## 2017-04-12 DIAGNOSIS — I509 Heart failure, unspecified: Secondary | ICD-10-CM | POA: Diagnosis not present

## 2017-04-17 ENCOUNTER — Telehealth: Payer: Self-pay | Admitting: *Deleted

## 2017-04-17 DIAGNOSIS — K219 Gastro-esophageal reflux disease without esophagitis: Secondary | ICD-10-CM | POA: Diagnosis not present

## 2017-04-17 DIAGNOSIS — M1990 Unspecified osteoarthritis, unspecified site: Secondary | ICD-10-CM | POA: Diagnosis not present

## 2017-04-17 DIAGNOSIS — Z7982 Long term (current) use of aspirin: Secondary | ICD-10-CM | POA: Diagnosis not present

## 2017-04-17 DIAGNOSIS — I5030 Unspecified diastolic (congestive) heart failure: Secondary | ICD-10-CM | POA: Diagnosis not present

## 2017-04-17 DIAGNOSIS — E119 Type 2 diabetes mellitus without complications: Secondary | ICD-10-CM | POA: Diagnosis not present

## 2017-04-17 DIAGNOSIS — I110 Hypertensive heart disease with heart failure: Secondary | ICD-10-CM | POA: Diagnosis not present

## 2017-04-17 DIAGNOSIS — I69354 Hemiplegia and hemiparesis following cerebral infarction affecting left non-dominant side: Secondary | ICD-10-CM | POA: Diagnosis not present

## 2017-04-17 DIAGNOSIS — E785 Hyperlipidemia, unspecified: Secondary | ICD-10-CM | POA: Diagnosis not present

## 2017-04-17 DIAGNOSIS — I4891 Unspecified atrial fibrillation: Secondary | ICD-10-CM | POA: Diagnosis not present

## 2017-04-17 DIAGNOSIS — Z8701 Personal history of pneumonia (recurrent): Secondary | ICD-10-CM | POA: Diagnosis not present

## 2017-04-17 NOTE — Telephone Encounter (Signed)
Donetta informed that I would sign orders as the attending of record.

## 2017-04-17 NOTE — Telephone Encounter (Signed)
Donetta calls and wants to verify that Dr. Andria Frames will be signing orders for home health on this patient.  Please call Donetta back and confirm. Fleeger, Salome Spotted, CMA

## 2017-04-18 ENCOUNTER — Telehealth: Payer: Self-pay | Admitting: *Deleted

## 2017-04-18 DIAGNOSIS — I69354 Hemiplegia and hemiparesis following cerebral infarction affecting left non-dominant side: Secondary | ICD-10-CM | POA: Diagnosis not present

## 2017-04-18 DIAGNOSIS — E119 Type 2 diabetes mellitus without complications: Secondary | ICD-10-CM | POA: Diagnosis not present

## 2017-04-18 DIAGNOSIS — M1990 Unspecified osteoarthritis, unspecified site: Secondary | ICD-10-CM | POA: Diagnosis not present

## 2017-04-18 DIAGNOSIS — I4891 Unspecified atrial fibrillation: Secondary | ICD-10-CM | POA: Diagnosis not present

## 2017-04-18 DIAGNOSIS — I110 Hypertensive heart disease with heart failure: Secondary | ICD-10-CM | POA: Diagnosis not present

## 2017-04-18 DIAGNOSIS — I5030 Unspecified diastolic (congestive) heart failure: Secondary | ICD-10-CM | POA: Diagnosis not present

## 2017-04-18 NOTE — Telephone Encounter (Signed)
Lia.Love, Physical Therapist with [facility] called request verbal orders for physical therapy. Order for twice a week for 4 weeks. Please call [phone].  Derl Barrow, RN

## 2017-04-18 NOTE — Telephone Encounter (Signed)
Called and order given 

## 2017-04-19 ENCOUNTER — Telehealth: Payer: Self-pay | Admitting: *Deleted

## 2017-04-19 DIAGNOSIS — E119 Type 2 diabetes mellitus without complications: Secondary | ICD-10-CM | POA: Diagnosis not present

## 2017-04-19 DIAGNOSIS — I69354 Hemiplegia and hemiparesis following cerebral infarction affecting left non-dominant side: Secondary | ICD-10-CM | POA: Diagnosis not present

## 2017-04-19 DIAGNOSIS — I110 Hypertensive heart disease with heart failure: Secondary | ICD-10-CM | POA: Diagnosis not present

## 2017-04-19 DIAGNOSIS — I4891 Unspecified atrial fibrillation: Secondary | ICD-10-CM | POA: Diagnosis not present

## 2017-04-19 DIAGNOSIS — I5030 Unspecified diastolic (congestive) heart failure: Secondary | ICD-10-CM | POA: Diagnosis not present

## 2017-04-19 DIAGNOSIS — M1990 Unspecified osteoarthritis, unspecified site: Secondary | ICD-10-CM | POA: Diagnosis not present

## 2017-04-19 NOTE — Telephone Encounter (Signed)
Patient left voice message on nurse line requesting someone give her a call back regarding her medication. She was recently discharged and started on Metoprolol. Patient wanted to know if she needed to continue with Losartan and Norvasc.   Tried calling patient back, no answer or voicemail.  Losartan and Norvasc were discontinued during her discharge.  Derl Barrow, RN

## 2017-04-23 DIAGNOSIS — M1990 Unspecified osteoarthritis, unspecified site: Secondary | ICD-10-CM | POA: Diagnosis not present

## 2017-04-23 DIAGNOSIS — E119 Type 2 diabetes mellitus without complications: Secondary | ICD-10-CM | POA: Diagnosis not present

## 2017-04-23 DIAGNOSIS — I4891 Unspecified atrial fibrillation: Secondary | ICD-10-CM | POA: Diagnosis not present

## 2017-04-23 DIAGNOSIS — I110 Hypertensive heart disease with heart failure: Secondary | ICD-10-CM | POA: Diagnosis not present

## 2017-04-23 DIAGNOSIS — I69354 Hemiplegia and hemiparesis following cerebral infarction affecting left non-dominant side: Secondary | ICD-10-CM | POA: Diagnosis not present

## 2017-04-23 DIAGNOSIS — I5030 Unspecified diastolic (congestive) heart failure: Secondary | ICD-10-CM | POA: Diagnosis not present

## 2017-04-24 DIAGNOSIS — E119 Type 2 diabetes mellitus without complications: Secondary | ICD-10-CM | POA: Diagnosis not present

## 2017-04-24 DIAGNOSIS — M1990 Unspecified osteoarthritis, unspecified site: Secondary | ICD-10-CM | POA: Diagnosis not present

## 2017-04-24 DIAGNOSIS — I69354 Hemiplegia and hemiparesis following cerebral infarction affecting left non-dominant side: Secondary | ICD-10-CM | POA: Diagnosis not present

## 2017-04-24 DIAGNOSIS — I110 Hypertensive heart disease with heart failure: Secondary | ICD-10-CM | POA: Diagnosis not present

## 2017-04-24 DIAGNOSIS — I5030 Unspecified diastolic (congestive) heart failure: Secondary | ICD-10-CM | POA: Diagnosis not present

## 2017-04-24 DIAGNOSIS — I4891 Unspecified atrial fibrillation: Secondary | ICD-10-CM | POA: Diagnosis not present

## 2017-04-25 ENCOUNTER — Telehealth: Payer: Self-pay | Admitting: *Deleted

## 2017-04-25 DIAGNOSIS — M1990 Unspecified osteoarthritis, unspecified site: Secondary | ICD-10-CM | POA: Diagnosis not present

## 2017-04-25 DIAGNOSIS — I5030 Unspecified diastolic (congestive) heart failure: Secondary | ICD-10-CM | POA: Diagnosis not present

## 2017-04-25 DIAGNOSIS — I110 Hypertensive heart disease with heart failure: Secondary | ICD-10-CM | POA: Diagnosis not present

## 2017-04-25 DIAGNOSIS — I69354 Hemiplegia and hemiparesis following cerebral infarction affecting left non-dominant side: Secondary | ICD-10-CM | POA: Diagnosis not present

## 2017-04-25 DIAGNOSIS — I4891 Unspecified atrial fibrillation: Secondary | ICD-10-CM | POA: Diagnosis not present

## 2017-04-25 DIAGNOSIS — E119 Type 2 diabetes mellitus without complications: Secondary | ICD-10-CM | POA: Diagnosis not present

## 2017-04-25 NOTE — Telephone Encounter (Signed)
Curt Bears OT from [facility].calls and needs verbal orders for OT for energy conservation.  She is requesting 1X week for 1 week                       and  2X for 3 weeks  Please call her back and give verbal orders @ [phone]. Fleeger, Salome Spotted, CMA

## 2017-04-26 NOTE — Telephone Encounter (Signed)
Verbal order given  

## 2017-04-27 DIAGNOSIS — I4891 Unspecified atrial fibrillation: Secondary | ICD-10-CM | POA: Diagnosis not present

## 2017-04-27 DIAGNOSIS — M1990 Unspecified osteoarthritis, unspecified site: Secondary | ICD-10-CM | POA: Diagnosis not present

## 2017-04-27 DIAGNOSIS — I69354 Hemiplegia and hemiparesis following cerebral infarction affecting left non-dominant side: Secondary | ICD-10-CM | POA: Diagnosis not present

## 2017-04-27 DIAGNOSIS — E119 Type 2 diabetes mellitus without complications: Secondary | ICD-10-CM | POA: Diagnosis not present

## 2017-04-27 DIAGNOSIS — I110 Hypertensive heart disease with heart failure: Secondary | ICD-10-CM | POA: Diagnosis not present

## 2017-04-27 DIAGNOSIS — I5030 Unspecified diastolic (congestive) heart failure: Secondary | ICD-10-CM | POA: Diagnosis not present

## 2017-05-01 DIAGNOSIS — I69354 Hemiplegia and hemiparesis following cerebral infarction affecting left non-dominant side: Secondary | ICD-10-CM | POA: Diagnosis not present

## 2017-05-01 DIAGNOSIS — I4891 Unspecified atrial fibrillation: Secondary | ICD-10-CM | POA: Diagnosis not present

## 2017-05-01 DIAGNOSIS — I110 Hypertensive heart disease with heart failure: Secondary | ICD-10-CM | POA: Diagnosis not present

## 2017-05-01 DIAGNOSIS — M1990 Unspecified osteoarthritis, unspecified site: Secondary | ICD-10-CM | POA: Diagnosis not present

## 2017-05-01 DIAGNOSIS — I5030 Unspecified diastolic (congestive) heart failure: Secondary | ICD-10-CM | POA: Diagnosis not present

## 2017-05-01 DIAGNOSIS — E119 Type 2 diabetes mellitus without complications: Secondary | ICD-10-CM | POA: Diagnosis not present

## 2017-05-02 DIAGNOSIS — I4891 Unspecified atrial fibrillation: Secondary | ICD-10-CM | POA: Diagnosis not present

## 2017-05-02 DIAGNOSIS — M1990 Unspecified osteoarthritis, unspecified site: Secondary | ICD-10-CM | POA: Diagnosis not present

## 2017-05-02 DIAGNOSIS — I110 Hypertensive heart disease with heart failure: Secondary | ICD-10-CM | POA: Diagnosis not present

## 2017-05-02 DIAGNOSIS — I69354 Hemiplegia and hemiparesis following cerebral infarction affecting left non-dominant side: Secondary | ICD-10-CM | POA: Diagnosis not present

## 2017-05-02 DIAGNOSIS — I5030 Unspecified diastolic (congestive) heart failure: Secondary | ICD-10-CM | POA: Diagnosis not present

## 2017-05-02 DIAGNOSIS — E119 Type 2 diabetes mellitus without complications: Secondary | ICD-10-CM | POA: Diagnosis not present

## 2017-05-03 ENCOUNTER — Ambulatory Visit: Payer: PRIVATE HEALTH INSURANCE | Admitting: Family Medicine

## 2017-05-04 DIAGNOSIS — I110 Hypertensive heart disease with heart failure: Secondary | ICD-10-CM | POA: Diagnosis not present

## 2017-05-04 DIAGNOSIS — M1990 Unspecified osteoarthritis, unspecified site: Secondary | ICD-10-CM | POA: Diagnosis not present

## 2017-05-04 DIAGNOSIS — I5030 Unspecified diastolic (congestive) heart failure: Secondary | ICD-10-CM | POA: Diagnosis not present

## 2017-05-04 DIAGNOSIS — E119 Type 2 diabetes mellitus without complications: Secondary | ICD-10-CM | POA: Diagnosis not present

## 2017-05-04 DIAGNOSIS — I4891 Unspecified atrial fibrillation: Secondary | ICD-10-CM | POA: Diagnosis not present

## 2017-05-04 DIAGNOSIS — I69354 Hemiplegia and hemiparesis following cerebral infarction affecting left non-dominant side: Secondary | ICD-10-CM | POA: Diagnosis not present

## 2017-05-07 ENCOUNTER — Telehealth: Payer: Self-pay | Admitting: *Deleted

## 2017-05-07 DIAGNOSIS — I4891 Unspecified atrial fibrillation: Secondary | ICD-10-CM | POA: Diagnosis not present

## 2017-05-07 DIAGNOSIS — I69354 Hemiplegia and hemiparesis following cerebral infarction affecting left non-dominant side: Secondary | ICD-10-CM | POA: Diagnosis not present

## 2017-05-07 DIAGNOSIS — I110 Hypertensive heart disease with heart failure: Secondary | ICD-10-CM | POA: Diagnosis not present

## 2017-05-07 DIAGNOSIS — E119 Type 2 diabetes mellitus without complications: Secondary | ICD-10-CM | POA: Diagnosis not present

## 2017-05-07 DIAGNOSIS — M1990 Unspecified osteoarthritis, unspecified site: Secondary | ICD-10-CM | POA: Diagnosis not present

## 2017-05-07 DIAGNOSIS — I5030 Unspecified diastolic (congestive) heart failure: Secondary | ICD-10-CM | POA: Diagnosis not present

## 2017-05-07 NOTE — Telephone Encounter (Signed)
Physical therapist with [facility] requesting verbal order for one additional visit.

## 2017-05-08 NOTE — Telephone Encounter (Signed)
FUQXAFH, Physical Therapy with [facility] called to request additional order for physical therapy. Verbal order given for one additional visit next week.   Please place a referral to [facility] per patient request.  Derl Barrow, RN

## 2017-05-09 DIAGNOSIS — E119 Type 2 diabetes mellitus without complications: Secondary | ICD-10-CM | POA: Diagnosis not present

## 2017-05-09 DIAGNOSIS — M1990 Unspecified osteoarthritis, unspecified site: Secondary | ICD-10-CM | POA: Diagnosis not present

## 2017-05-09 DIAGNOSIS — I69354 Hemiplegia and hemiparesis following cerebral infarction affecting left non-dominant side: Secondary | ICD-10-CM | POA: Diagnosis not present

## 2017-05-09 DIAGNOSIS — I4891 Unspecified atrial fibrillation: Secondary | ICD-10-CM | POA: Diagnosis not present

## 2017-05-09 DIAGNOSIS — I5030 Unspecified diastolic (congestive) heart failure: Secondary | ICD-10-CM | POA: Diagnosis not present

## 2017-05-09 DIAGNOSIS — I110 Hypertensive heart disease with heart failure: Secondary | ICD-10-CM | POA: Diagnosis not present

## 2017-05-09 NOTE — Addendum Note (Signed)
Addended by: Zenia Resides on: 05/09/2017 10:25 AM   Modules accepted: Orders

## 2017-05-09 NOTE — Telephone Encounter (Signed)
Order placed

## 2017-05-10 ENCOUNTER — Ambulatory Visit
Admission: RE | Admit: 2017-05-10 | Discharge: 2017-05-10 | Disposition: A | Discharge status: Home / Self Care | Payer: Medicare Other | Source: Ambulatory Visit | Attending: Family Medicine | Admitting: Family Medicine

## 2017-05-10 ENCOUNTER — Ambulatory Visit (INDEPENDENT_AMBULATORY_CARE_PROVIDER_SITE_OTHER): Payer: Medicare Other | Admitting: Family Medicine

## 2017-05-10 ENCOUNTER — Encounter: Payer: Self-pay | Admitting: Family Medicine

## 2017-05-10 DIAGNOSIS — I480 Paroxysmal atrial fibrillation: Secondary | ICD-10-CM

## 2017-05-10 DIAGNOSIS — I4891 Unspecified atrial fibrillation: Secondary | ICD-10-CM | POA: Diagnosis not present

## 2017-05-10 DIAGNOSIS — I5032 Chronic diastolic (congestive) heart failure: Secondary | ICD-10-CM

## 2017-05-10 DIAGNOSIS — R110 Nausea: Secondary | ICD-10-CM

## 2017-05-10 DIAGNOSIS — R0600 Dyspnea, unspecified: Secondary | ICD-10-CM

## 2017-05-10 DIAGNOSIS — E119 Type 2 diabetes mellitus without complications: Secondary | ICD-10-CM | POA: Diagnosis not present

## 2017-05-10 DIAGNOSIS — I110 Hypertensive heart disease with heart failure: Secondary | ICD-10-CM | POA: Diagnosis not present

## 2017-05-10 DIAGNOSIS — Z23 Encounter for immunization: Secondary | ICD-10-CM

## 2017-05-10 DIAGNOSIS — I5030 Unspecified diastolic (congestive) heart failure: Secondary | ICD-10-CM | POA: Diagnosis not present

## 2017-05-10 DIAGNOSIS — M1990 Unspecified osteoarthritis, unspecified site: Secondary | ICD-10-CM | POA: Diagnosis not present

## 2017-05-10 DIAGNOSIS — I69354 Hemiplegia and hemiparesis following cerebral infarction affecting left non-dominant side: Secondary | ICD-10-CM | POA: Diagnosis not present

## 2017-05-10 DIAGNOSIS — I509 Heart failure, unspecified: Secondary | ICD-10-CM | POA: Diagnosis not present

## 2017-05-10 NOTE — Patient Instructions (Signed)
I will call with the test results. I think you still have too much fluid in your body.  Go back to a full dose of the lasix. I am not sure about the nausea.  Non of your new medications typically cause stomach upset.

## 2017-05-10 NOTE — Assessment & Plan Note (Signed)
Seems to be in sinus (or regular a fib.)  No change in meds.

## 2017-05-10 NOTE — Assessment & Plan Note (Signed)
Multifactorial.  CHF?  Likely deconditioning.  Will check CXR to insure no residual pneumonia.

## 2017-05-10 NOTE — Assessment & Plan Note (Signed)
Unclear etiology.  Will check CBC and CMP

## 2017-05-10 NOTE — Assessment & Plan Note (Signed)
Clinically, she needs more diuresis.  Will check CXR and BNP to verify this clinical suspicion.

## 2017-05-10 NOTE — Progress Notes (Signed)
   Subjective:    Patient ID: Karen Jackson, female    DOB: 03-18-1941, 76 y.o.   MRN: [id number]  HPI FU hospitalization.  Pneumonia, CHF and a fib with RVR.  Doing OK but has the following symptoms: 1. Improving mobility and regaining strength.  Does have some dyspnea on exertion. 2. Still some leg swelling.  Has trouble taking the lasix daily - it is hard for her to get to the bathroom on time. 3. Decrease appetite, some nausea.  No vomiting.  Off antibiotics.  Only new meds are diltiazem and metoprolol.   4. No palpitations.      Review of Systems     Objective:   Physical Exam Lungs clear Cardiac RRR without m or g Ext 2+ bilateral leg edema.       Assessment & Plan:

## 2017-05-11 LAB — CMP14+EGFR
ALBUMIN: 4.1 g/dL (ref 3.5–4.8)
ALT: 10 IU/L (ref 0–32)
AST: 14 IU/L (ref 0–40)
Albumin/Globulin Ratio: 1.5 (ref 1.2–2.2)
Alkaline Phosphatase: 67 IU/L (ref 39–117)
BUN / CREAT RATIO: 26 (ref 12–28)
BUN: 12 mg/dL (ref 8–27)
Bilirubin Total: 0.7 mg/dL (ref 0.0–1.2)
CHLORIDE: 97 mmol/L (ref 96–106)
CO2: 27 mmol/L (ref 20–29)
CREATININE: 0.46 mg/dL — AB (ref 0.57–1.00)
Calcium: 9.6 mg/dL (ref 8.7–10.3)
GFR calc non Af Amer: 97 mL/min/{1.73_m2} (ref >59)
GFR, EST AFRICAN AMERICAN: 112 mL/min/{1.73_m2} (ref >59)
GLUCOSE: 125 mg/dL — AB (ref 65–99)
Globulin, Total: 2.7 g/dL (ref 1.5–4.5)
Potassium: 4.5 mmol/L (ref 3.5–5.2)
Sodium: 141 mmol/L (ref 134–144)
TOTAL PROTEIN: 6.8 g/dL (ref 6.0–8.5)

## 2017-05-11 LAB — CBC
HEMATOCRIT: 44.6 % (ref 34.0–46.6)
HEMOGLOBIN: 14.5 g/dL (ref 11.1–15.9)
MCH: 32.3 pg (ref 26.6–33.0)
MCHC: 32.5 g/dL (ref 31.5–35.7)
MCV: 99 fL — AB (ref 79–97)
Platelets: 377 10*3/uL (ref 150–379)
RBC: 4.49 x10E6/uL (ref 3.77–5.28)
RDW: 14.4 % (ref 12.3–15.4)
WBC: 7.5 10*3/uL (ref 3.4–10.8)

## 2017-05-11 LAB — BRAIN NATRIURETIC PEPTIDE: BNP: 133.9 pg/mL — AB (ref 0.0–100.0)

## 2017-05-14 ENCOUNTER — Other Ambulatory Visit: Payer: Self-pay | Admitting: Family Medicine

## 2017-05-14 DIAGNOSIS — I110 Hypertensive heart disease with heart failure: Secondary | ICD-10-CM | POA: Diagnosis not present

## 2017-05-14 DIAGNOSIS — I4891 Unspecified atrial fibrillation: Secondary | ICD-10-CM | POA: Diagnosis not present

## 2017-05-14 DIAGNOSIS — M1990 Unspecified osteoarthritis, unspecified site: Secondary | ICD-10-CM | POA: Diagnosis not present

## 2017-05-14 DIAGNOSIS — I69354 Hemiplegia and hemiparesis following cerebral infarction affecting left non-dominant side: Secondary | ICD-10-CM | POA: Diagnosis not present

## 2017-05-14 DIAGNOSIS — E119 Type 2 diabetes mellitus without complications: Secondary | ICD-10-CM | POA: Diagnosis not present

## 2017-05-14 DIAGNOSIS — I5030 Unspecified diastolic (congestive) heart failure: Secondary | ICD-10-CM | POA: Diagnosis not present

## 2017-05-15 DIAGNOSIS — I4891 Unspecified atrial fibrillation: Secondary | ICD-10-CM | POA: Diagnosis not present

## 2017-05-15 DIAGNOSIS — I5030 Unspecified diastolic (congestive) heart failure: Secondary | ICD-10-CM | POA: Diagnosis not present

## 2017-05-15 DIAGNOSIS — E119 Type 2 diabetes mellitus without complications: Secondary | ICD-10-CM | POA: Diagnosis not present

## 2017-05-15 DIAGNOSIS — I69354 Hemiplegia and hemiparesis following cerebral infarction affecting left non-dominant side: Secondary | ICD-10-CM | POA: Diagnosis not present

## 2017-05-15 DIAGNOSIS — I110 Hypertensive heart disease with heart failure: Secondary | ICD-10-CM | POA: Diagnosis not present

## 2017-05-15 DIAGNOSIS — M1990 Unspecified osteoarthritis, unspecified site: Secondary | ICD-10-CM | POA: Diagnosis not present

## 2017-05-15 NOTE — Telephone Encounter (Signed)
Called patient and verified that diltiazem 240 is current correct dose.

## 2017-05-16 DIAGNOSIS — E119 Type 2 diabetes mellitus without complications: Secondary | ICD-10-CM | POA: Diagnosis not present

## 2017-05-16 DIAGNOSIS — I69354 Hemiplegia and hemiparesis following cerebral infarction affecting left non-dominant side: Secondary | ICD-10-CM | POA: Diagnosis not present

## 2017-05-16 DIAGNOSIS — I5030 Unspecified diastolic (congestive) heart failure: Secondary | ICD-10-CM | POA: Diagnosis not present

## 2017-05-16 DIAGNOSIS — I4891 Unspecified atrial fibrillation: Secondary | ICD-10-CM | POA: Diagnosis not present

## 2017-05-16 DIAGNOSIS — I110 Hypertensive heart disease with heart failure: Secondary | ICD-10-CM | POA: Diagnosis not present

## 2017-05-16 DIAGNOSIS — M1990 Unspecified osteoarthritis, unspecified site: Secondary | ICD-10-CM | POA: Diagnosis not present

## 2017-05-16 MED ORDER — DILTIAZEM HCL ER COATED BEADS 240 MG PO CP24: 240.0000 mg | ORAL_CAPSULE | Freq: Every day | ORAL | 3 refills | Status: DC

## 2017-05-16 NOTE — Addendum Note (Signed)
Addended by: Derl Barrow on: 05/16/2017 11:38 AM   Modules accepted: Orders

## 2017-05-16 NOTE — Addendum Note (Signed)
Addended by: Zenia Resides on: 05/16/2017 01:50 PM   Modules accepted: Orders

## 2017-05-16 NOTE — Telephone Encounter (Signed)
[facility] called to request new Rx for Diltiazem with the correct dosage.  Derl Barrow, RN

## 2017-05-17 DIAGNOSIS — I4891 Unspecified atrial fibrillation: Secondary | ICD-10-CM | POA: Diagnosis not present

## 2017-05-17 DIAGNOSIS — M1990 Unspecified osteoarthritis, unspecified site: Secondary | ICD-10-CM | POA: Diagnosis not present

## 2017-05-17 DIAGNOSIS — I5030 Unspecified diastolic (congestive) heart failure: Secondary | ICD-10-CM | POA: Diagnosis not present

## 2017-05-17 DIAGNOSIS — I69354 Hemiplegia and hemiparesis following cerebral infarction affecting left non-dominant side: Secondary | ICD-10-CM | POA: Diagnosis not present

## 2017-05-17 DIAGNOSIS — E119 Type 2 diabetes mellitus without complications: Secondary | ICD-10-CM | POA: Diagnosis not present

## 2017-05-17 DIAGNOSIS — I110 Hypertensive heart disease with heart failure: Secondary | ICD-10-CM | POA: Diagnosis not present

## 2017-05-22 ENCOUNTER — Ambulatory Visit: Payer: Medicare Other | Attending: Family Medicine | Admitting: Physical Therapy

## 2017-05-22 ENCOUNTER — Encounter: Payer: Self-pay | Admitting: Physical Therapy

## 2017-05-22 DIAGNOSIS — I6990 Unspecified sequelae of unspecified cerebrovascular disease: Secondary | ICD-10-CM | POA: Diagnosis not present

## 2017-05-22 DIAGNOSIS — M6281 Muscle weakness (generalized): Secondary | ICD-10-CM

## 2017-05-22 DIAGNOSIS — I69354 Hemiplegia and hemiparesis following cerebral infarction affecting left non-dominant side: Secondary | ICD-10-CM

## 2017-05-22 DIAGNOSIS — R262 Difficulty in walking, not elsewhere classified: Secondary | ICD-10-CM

## 2017-05-22 DIAGNOSIS — R296 Repeated falls: Secondary | ICD-10-CM | POA: Diagnosis not present

## 2017-05-22 DIAGNOSIS — R270 Ataxia, unspecified: Secondary | ICD-10-CM

## 2017-05-22 NOTE — Therapy (Signed)
[address] [place], [place], [address] Phone: [phone]   Fax:  [phone]  Physical Therapy Evaluation  Patient Details  Name: Karen Jackson MRN: [id number] Date of Birth: 1941-02-21 Referring Provider: Andria Frames  Encounter Date: 05/22/2017      PT End of Session - 05/22/17 1315    Visit Number 1   Date for PT Re-Evaluation 07/22/17   PT Start Time 3810   PT Stop Time 1100   PT Time Calculation (min) 45 min   Activity Tolerance Patient tolerated treatment well   Behavior During Therapy [facility] for tasks assessed/performed      Past Medical History:  Diagnosis Date  . Acute cystitis without hematuria   . Acute diastolic CHF (congestive heart failure) ([place])   . Arthritis   . Dyspnea   . Fever of unknown origin 03/19/2017  . Hyperlipidemia   . Hypertension   . Multifocal pneumonia   . Osteopenia   . Persistent atrial fibrillation ([place])   . Stroke [facility]) 2013    Past Surgical History:  Procedure Laterality Date  . ANKLE SURGERY    . APPENDECTOMY    . CHOLECYSTECTOMY    . HERNIA REPAIR     Esophagus  . JOINT REPLACEMENT     total- right partial- left  . MASTECTOMY PARTIAL / LUMPECTOMY  2012   left    There were no vitals filed for this visit.       Subjective Assessment - 05/22/17 1013    Subjective Patient came down with pneumonia about the end of July, she was in the hospital for 11 days, then to SNF for 16 days.  Then home.  She reports that she has had more and more difficulty walking and is tired, she gets SOB very quickly.     Limitations Walking;House hold activities   Patient Stated Goals get stronger, walk better and have no falls   Currently in Pain? No/denies            [facility] PT Assessment - 05/22/17 0001      Assessment   Medical Diagnosis weakness, difficulty walking   Referring Provider H[place]el   Onset Date/Surgical Date 04/21/17   Hand Dominance Right   Prior  Therapy has had PT in the past, most recently about 4 months ago for a series of falls     Precautions   Precautions Fall     Balance Screen   Has the patient fallen in the past 6 months Yes   How many times? 4   Has the patient had a decrease in activity level because of a fear of falling?  Yes   Is the patient reluctant to leave their home because of a fear of falling?  Yes     Home Environment   Additional Comments has a ramp into the home, has help with home, has 10 hours of an aide     Prior Function   Level of Independence Needs assistance with homemaking;Independent with household mobility with device   Leisure plays bridge, goes to church     Observation/Other Assessments   Observations O2 saturation at rest 94%, with 20 feet of walking O2 drops to 85%     Posture/Postural Control   Posture Comments slouched posture, tends to lean away from the left and slide down, demonstrates neglect of the left side, the arm hangs down and behind her     AROM   Left Shoulder  Flexion 95 Degrees   Left Shoulder ABduction 60 Degrees   Left Knee Extension 0   Left Knee Flexion 95   Left Ankle Dorsiflexion 6     Strength   Overall Strength Comments left hip 3/5, knee extension 4-/5, knee fleixon 3+/5, ankle DF 3+/5, inversion and eversion 2/5     Palpation   Palpation comment tender in the left knee and the left shoulder      Transfers   Comments slow with arms getting up, when sitting a lot of times she just plops down and seems dangerous and not a lot of control     Ambulation/Gait   Gait Comments with FWW, very slow, ataxic motions of the left LE, unsteady with turns, tends to neglect the left LE, Ambulated with patient 80 feet with FWW then needed to rest     Timed Up and Go Test   Normal TUG (seconds) 65            Objective measurements completed on examination: See above findings.          [facility] Adult PT Treatment/Exercise - 05/22/17 0001      Knee/Hip  Exercises: Aerobic   Nustep Level 4 x 5 minutes, needed cues to stay moving and to not let go of the left arm   Other Aerobic UBE level 2 x 4 minutes, again cues to keep moving                  PT Short Term Goals - 05/22/17 1324      PT SHORT TERM GOAL #1   Title independent with initial HEP   Time 2   Period Weeks   Status New           PT Long Term Goals - 05/22/17 1325      PT LONG TERM GOAL #1   Title decrease timed up and go test to 25 seconds   Time 8   Period Weeks   Status New     PT LONG TERM GOAL #2   Title increased right LE strength to 4-/5   Time 8   Period Weeks   Status New     PT LONG TERM GOAL #3   Title walk 200 feet without rest using FWW   Time 8   Period Weeks   Status New     PT LONG TERM GOAL #4   Title no falls over a 6 week period   Time 8   Period Weeks   Status New     PT LONG TERM GOAL #5   Title report no difficulty pulling her pants up when going to the bathroom   Baseline at evaluation she required assistance, reports 2 falls in the past year while trying to pull pants up   Time 8   Period Weeks   Status New                Plan - 05/22/17 1316    Clinical Impression Statement Patient was seen here about 4 months ago, we were seeing her at that time for a few falls that she had.  She has a hx of stoke with left side weakness and neglect.  She went to the hospital in July due to pneumonia, she reports an 11 day hospital stay and a 16 day SNF stay.  Her O2 saturation level were very low and she was on 10liters of oxygen.  She returns to day with a much  slower TUG time than in June, decreased ability to walk and needing to rest with 50 feet of walking   Clinical Presentation Evolving   Clinical Presentation due to: recent pneumonia, a drop from PLOF, recent hospitalization and SNF stay   Clinical Decision Making Moderate   Rehab Potential Fair   PT Frequency 2x / week   PT Duration 8 weeks   PT  Treatment/Interventions ADLs/Self Care Home Management;Electrical Stimulation;Moist Heat;Therapeutic exercise;Therapeutic activities;Functional mobility training;Gait training;Balance training;Neuromuscular re-education;Patient/family education;Manual techniques;Passive range of motion   PT Next Visit Plan start treatment to get back to her PLOF, gait, safety and strength, monitor O2   Consulted and Agree with Plan of Care Patient      Patient will benefit from skilled therapeutic intervention in order to improve the following deficits and impairments:  Abnormal gait, Cardiopulmonary status limiting activity, Decreased activity tolerance, Decreased mobility, Decreased endurance, Decreased range of motion, Decreased strength, Difficulty walking, Impaired flexibility, Impaired sensation, Improper body mechanics, Pain, Impaired UE functional use  Visit Diagnosis: Muscle weakness (generalized) - Plan: PT plan of care cert/re-cert  Difficulty in walking, not elsewhere classified - Plan: PT plan of care cert/re-cert  Repeated falls - Plan: PT plan of care cert/re-cert  Late effects of CVA (cerebrovascular accident) - Plan: PT plan of care cert/re-cert  Hemiplegia and hemiparesis following cerebral infarction affecting left non-dominant side ([place]) - Plan: PT plan of care cert/re-cert  Ataxia, unspecified - Plan: PT plan of care cert/re-cert      G-Codes - 09/81/19 1330    Functional Assessment Tool Used (Outpatient Only) foto 79% limited   Functional Limitation Mobility: Walking and moving around   Mobility: Walking and Moving Around Current Status (J4782) At least 60 percent but less than 80 percent impaired, limited or restricted   Mobility: Walking and Moving Around Goal Status (N5621) At least 40 percent but less than 60 percent impaired, limited or restricted       Problem List Patient Active Problem List   Diagnosis Date Noted  . Nausea without vomiting 05/10/2017  . Hypoxemia   .  Heart failure with preserved ejection fraction ([place]), Grade 3 diastolic dysfunction 30/86/5784  . Atrial fibrillation with RVR ([place])   . Chronic diastolic CHF (congestive heart failure) ([place])   . PAF (paroxysmal atrial fibrillation) ([place])   . History of stroke   . Dyspnea 03/19/2017  . Shortness of breath 03/19/2017  . Acute cystitis without hematuria   . Fever   . Metabolic syndrome 69/62/9528  . Encounter for preventive health examination 02/17/2016  . Morbid obesity ([place]) 06/17/2015  . Fall at home 12/01/2014  . Hypomagnesemia 04/24/2014  . Hemiparesis affecting left side as late effect of cerebrovascular accident ([place]) 04/24/2014  . Nontraumatic cerebral hemorrhage ([place]) 04/30/2012  . Diabetes type 2, controlled ([place]) 03/04/2010  . OSTEOPENIA 01/21/2009  . UNSPECIFIED VITAMIN D DEFICIENCY 11/19/2007  . ESSENTIAL HYPERTENSION, BENIGN 11/19/2007  . HYPERCHOLESTEROLEMIA 10/25/2006  . GASTROESOPHAGEAL REFLUX, NO ESOPHAGITIS 10/25/2006  . DIVERTICULOSIS OF COLON 10/25/2006  . Osteoarthritis 10/25/2006  . CERVICAL SPINE DISORDER, NOS 10/25/2006    Sumner Boast., PT 05/22/2017, 1:32 PM  [address] [place], [place], [address] Phone: [phone]   Fax:  [phone]  Name: Karen Jackson MRN: [id number] Date of Birth: [birth year]

## 2017-05-24 ENCOUNTER — Encounter: Payer: Self-pay | Admitting: Physical Therapy

## 2017-05-24 ENCOUNTER — Ambulatory Visit: Payer: Medicare Other | Admitting: Physical Therapy

## 2017-05-24 DIAGNOSIS — I69354 Hemiplegia and hemiparesis following cerebral infarction affecting left non-dominant side: Secondary | ICD-10-CM | POA: Diagnosis not present

## 2017-05-24 DIAGNOSIS — R296 Repeated falls: Secondary | ICD-10-CM

## 2017-05-24 DIAGNOSIS — I6990 Unspecified sequelae of unspecified cerebrovascular disease: Secondary | ICD-10-CM | POA: Diagnosis not present

## 2017-05-24 DIAGNOSIS — M6281 Muscle weakness (generalized): Secondary | ICD-10-CM | POA: Diagnosis not present

## 2017-05-24 DIAGNOSIS — R270 Ataxia, unspecified: Secondary | ICD-10-CM

## 2017-05-24 DIAGNOSIS — R262 Difficulty in walking, not elsewhere classified: Secondary | ICD-10-CM | POA: Diagnosis not present

## 2017-05-24 NOTE — Therapy (Signed)
[address] [place], [place], [address] Phone: [phone]   Fax:  [phone]  Physical Therapy Treatment  Patient Details  Name: Karen Jackson MRN: [id number] Date of Birth: 1941/08/04 Referring Provider: Andria Frames  Encounter Date: 05/24/2017      PT End of Session - 05/24/17 1104    Visit Number 2   Date for PT Re-Evaluation 07/22/17   PT Start Time 1010   PT Stop Time 1059   PT Time Calculation (min) 49 min   Activity Tolerance Patient tolerated treatment well   Behavior During Therapy [facility] for tasks assessed/performed      Past Medical History:  Diagnosis Date  . Acute cystitis without hematuria   . Acute diastolic CHF (congestive heart failure) ([place])   . Arthritis   . Dyspnea   . Fever of unknown origin 03/19/2017  . Hyperlipidemia   . Hypertension   . Multifocal pneumonia   . Osteopenia   . Persistent atrial fibrillation ([place])   . Stroke [facility]) 2013    Past Surgical History:  Procedure Laterality Date  . ANKLE SURGERY    . APPENDECTOMY    . CHOLECYSTECTOMY    . HERNIA REPAIR     Esophagus  . JOINT REPLACEMENT     total- right partial- left  . MASTECTOMY PARTIAL / LUMPECTOMY  2012   left    There were no vitals filed for this visit.      Subjective Assessment - 05/24/17 1023    Subjective Patient reports stiff due to weather.   Currently in Pain? No/denies                         [facility] Adult PT Treatment/Exercise - 05/24/17 0001      Ambulation/Gait   Gait Comments gait x 100 feet with HHA then O2 was 84 %     High Level Balance   High Level Balance Activities Side stepping;Backward walking   High Level Balance Comments 20 feet of side stepping dropped O2 to 79 %     Knee/Hip Exercises: Aerobic   Nustep Level 4 x 7 minutes, needed cues to stay moving and to not let go of the left arm   Other Aerobic UBE level 2 x 4 minutes, again cues to keep moving      Knee/Hip Exercises: Standing   Other Standing Knee Exercises 4" toe clears 2x10 each leg     Knee/Hip Exercises: Seated   Other Seated Knee/Hip Exercises 2# LAQ, marches, yellow tband scapular retraction, ankle DF and Eversion     Manual Therapy   Manual therapy comments PROM of the left shoulder, approximation of the fingers with motions to stimulate increased function                  PT Short Term Goals - 05/22/17 1324      PT SHORT TERM GOAL #1   Title independent with initial HEP   Time 2   Period Weeks   Status New           PT Long Term Goals - 05/22/17 1325      PT LONG TERM GOAL #1   Title decrease timed up and go test to 25 seconds   Time 8   Period Weeks   Status New     PT LONG TERM GOAL #2   Title increased right LE strength to 4-/5  Time 8   Period Weeks   Status New     PT LONG TERM GOAL #3   Title walk 200 feet without rest using FWW   Time 8   Period Weeks   Status New     PT LONG TERM GOAL #4   Title no falls over a 6 week period   Time 8   Period Weeks   Status New     PT LONG TERM GOAL #5   Title report no difficulty pulling her pants up when going to the bathroom   Baseline at evaluation she required assistance, reports 2 falls in the past year while trying to pull pants up   Time 8   Period Weeks   Status New               Plan - 05/24/17 1104    Clinical Impression Statement 100 feet of HHA walking O2 down to 84%, 20 feet of sidestepping O2 down to 79%.  She did very well with all exercises, less neglect today.  With the walking her left knee did start to buckle as she got tired   PT Next Visit Plan start treatment to get back to her PLOF, gait, safety and strength, monitor O2   Consulted and Agree with Plan of Care Patient      Patient will benefit from skilled therapeutic intervention in order to improve the following deficits and impairments:  Abnormal gait, Cardiopulmonary status limiting activity,  Decreased activity tolerance, Decreased mobility, Decreased endurance, Decreased range of motion, Decreased strength, Difficulty walking, Impaired flexibility, Impaired sensation, Improper body mechanics, Pain, Impaired UE functional use  Visit Diagnosis: Muscle weakness (generalized)  Difficulty in walking, not elsewhere classified  Repeated falls  Late effects of CVA (cerebrovascular accident)  Hemiplegia and hemiparesis following cerebral infarction affecting left non-dominant side (HCC)  Ataxia, unspecified     Problem List Patient Active Problem List   Diagnosis Date Noted  . Nausea without vomiting 05/10/2017  . Hypoxemia   . Heart failure with preserved ejection fraction ([place]), Grade 3 diastolic dysfunction 93/26/7124  . Atrial fibrillation with RVR ([place])   . Chronic diastolic CHF (congestive heart failure) ([place])   . PAF (paroxysmal atrial fibrillation) ([place])   . History of stroke   . Dyspnea 03/19/2017  . Shortness of breath 03/19/2017  . Acute cystitis without hematuria   . Fever   . Metabolic syndrome 58/04/9832  . Encounter for preventive health examination 02/17/2016  . Morbid obesity ([place]) 06/17/2015  . Fall at home 12/01/2014  . Hypomagnesemia 04/24/2014  . Hemiparesis affecting left side as late effect of cerebrovascular accident ([place]) 04/24/2014  . Nontraumatic cerebral hemorrhage ([place]) 04/30/2012  . Diabetes type 2, controlled ([place]) 03/04/2010  . OSTEOPENIA 01/21/2009  . UNSPECIFIED VITAMIN D DEFICIENCY 11/19/2007  . ESSENTIAL HYPERTENSION, BENIGN 11/19/2007  . HYPERCHOLESTEROLEMIA 10/25/2006  . GASTROESOPHAGEAL REFLUX, NO ESOPHAGITIS 10/25/2006  . DIVERTICULOSIS OF COLON 10/25/2006  . Osteoarthritis 10/25/2006  . CERVICAL SPINE DISORDER, NOS 10/25/2006    Sumner Boast., PT 05/24/2017, 11:06 AM  [address] [place], [place], [address] Phone: [phone]   Fax:   [phone]  Name: Karen Jackson MRN: [id number] Date of Birth: 06/08/1941

## 2017-05-29 ENCOUNTER — Encounter: Payer: Self-pay | Admitting: Physical Therapy

## 2017-05-29 ENCOUNTER — Ambulatory Visit: Payer: Medicare Other | Attending: Family Medicine | Admitting: Physical Therapy

## 2017-05-29 DIAGNOSIS — R296 Repeated falls: Secondary | ICD-10-CM | POA: Insufficient documentation

## 2017-05-29 DIAGNOSIS — I69354 Hemiplegia and hemiparesis following cerebral infarction affecting left non-dominant side: Secondary | ICD-10-CM | POA: Insufficient documentation

## 2017-05-29 DIAGNOSIS — M6281 Muscle weakness (generalized): Secondary | ICD-10-CM | POA: Insufficient documentation

## 2017-05-29 DIAGNOSIS — R270 Ataxia, unspecified: Secondary | ICD-10-CM | POA: Diagnosis not present

## 2017-05-29 DIAGNOSIS — I6990 Unspecified sequelae of unspecified cerebrovascular disease: Secondary | ICD-10-CM | POA: Insufficient documentation

## 2017-05-29 DIAGNOSIS — R262 Difficulty in walking, not elsewhere classified: Secondary | ICD-10-CM | POA: Diagnosis not present

## 2017-05-29 NOTE — Therapy (Signed)
[place] [place], [place], [address] Phone: [phone]   Fax:  [phone]  Physical Therapy Treatment  Patient Details  Name: Karen Jackson MRN: [id number] Date of Birth: [birth year] Referring Provider: Andria Frames  Encounter Date: 05/29/2017      PT End of Session - 05/29/17 1054    Visit Number 3   Date for PT Re-Evaluation 07/22/17   PT Start Time 1005   PT Stop Time 1055   PT Time Calculation (min) 50 min   Activity Tolerance Patient tolerated treatment well   Behavior During Therapy [facility] for tasks assessed/performed      Past Medical History:  Diagnosis Date  . Acute cystitis without hematuria   . Acute diastolic CHF (congestive heart failure) ([place])   . Arthritis   . Dyspnea   . Fever of unknown origin 03/19/2017  . Hyperlipidemia   . Hypertension   . Multifocal pneumonia   . Osteopenia   . Persistent atrial fibrillation ([place])   . Stroke [facility]) 2013    Past Surgical History:  Procedure Laterality Date  . ANKLE SURGERY    . APPENDECTOMY    . CHOLECYSTECTOMY    . HERNIA REPAIR     Esophagus  . JOINT REPLACEMENT     total- right partial- left  . MASTECTOMY PARTIAL / LUMPECTOMY  2012   left    There were no vitals filed for this visit.      Subjective Assessment - 05/29/17 1016    Subjective Patient reports overall she is feeling a little stronger and better, reports walking some in the home   Currently in Pain? No/denies                         [facility] Adult PT Treatment/Exercise - 05/29/17 0001      Ambulation/Gait   Gait Comments gait HHA x 120 feet then rest and then 60 feet to the car     High Level Balance   High Level Balance Activities Side stepping;Backward walking     Knee/Hip Exercises: Aerobic   Nustep Level 4 x 7 minutes, needed cues to stay moving and to not let go of the left arm   Other Aerobic UBE level 2 x 4 minutes, again cues to keep moving      Knee/Hip Exercises: Standing   Other Standing Knee Exercises standing 2# marches and hip abduction   Other Standing Knee Exercises 4" toe clears 2x10 each leg, 2# each leg     Knee/Hip Exercises: Seated   Other Seated Knee/Hip Exercises 1 Blue band fitter extension 2x10, yellow tband anklke DF and eversion 2x10 each     Manual Therapy   Manual therapy comments PROM of the left shoulder, approximation of the fingers with motions to stimulate increased function                  PT Short Term Goals - 05/29/17 1056      PT SHORT TERM GOAL #1   Title independent with initial HEP   Status Partially Met           PT Long Term Goals - 05/22/17 1325      PT LONG TERM GOAL #1   Title decrease timed up and go test to 25 seconds   Time 8   Period Weeks   Status New     PT LONG TERM GOAL #2  Title increased right LE strength to 4-/5   Time 8   Period Weeks   Status New     PT LONG TERM GOAL #3   Title walk 200 feet without rest using FWW   Time 8   Period Weeks   Status New     PT LONG TERM GOAL #4   Title no falls over a 6 week period   Time 8   Period Weeks   Status New     PT LONG TERM GOAL #5   Title report no difficulty pulling her pants up when going to the bathroom   Baseline at evaluation she required assistance, reports 2 falls in the past year while trying to pull pants up   Time 8   Period Weeks   Status New               Plan - 05/29/17 1055    Clinical Impression Statement O2 did not drop as much today with the side stepping, she still get some impulsiveness when she gets close to needing rest and will plop down, other activities today she did better   PT Next Visit Plan  get back to her PLOF, gait, safety and strength, monitor O2   Consulted and Agree with Plan of Care Patient      Patient will benefit from skilled therapeutic intervention in order to improve the following deficits and impairments:     Visit  Diagnosis: Muscle weakness (generalized)  Difficulty in walking, not elsewhere classified  Repeated falls  Late effects of CVA (cerebrovascular accident)  Hemiplegia and hemiparesis following cerebral infarction affecting left non-dominant side (HCC)  Ataxia, unspecified     Problem List Patient Active Problem List   Diagnosis Date Noted  . Nausea without vomiting 05/10/2017  . Hypoxemia   . Heart failure with preserved ejection fraction ([place]), Grade 3 diastolic dysfunction 26/71/2458  . Atrial fibrillation with RVR ([place])   . Chronic diastolic CHF (congestive heart failure) ([place])   . PAF (paroxysmal atrial fibrillation) ([place])   . History of stroke   . Dyspnea 03/19/2017  . Shortness of breath 03/19/2017  . Acute cystitis without hematuria   . Fever   . Metabolic syndrome 09/98/3382  . Encounter for preventive health examination 02/17/2016  . Morbid obesity ([place]) 06/17/2015  . Fall at home 12/01/2014  . Hypomagnesemia 04/24/2014  . Hemiparesis affecting left side as late effect of cerebrovascular accident ([place]) 04/24/2014  . Nontraumatic cerebral hemorrhage ([place]) 04/30/2012  . Diabetes type 2, controlled ([place]) 03/04/2010  . OSTEOPENIA 01/21/2009  . UNSPECIFIED VITAMIN D DEFICIENCY 11/19/2007  . ESSENTIAL HYPERTENSION, BENIGN 11/19/2007  . HYPERCHOLESTEROLEMIA 10/25/2006  . GASTROESOPHAGEAL REFLUX, NO ESOPHAGITIS 10/25/2006  . DIVERTICULOSIS OF COLON 10/25/2006  . Osteoarthritis 10/25/2006  . CERVICAL SPINE DISORDER, NOS 10/25/2006    Sumner Boast., PT 05/29/2017, 10:57 AM  [address] [place], [place], [address] Phone: [phone]   Fax:  [phone]  Name: Karen Jackson MRN: [id number] Date of Birth: 11-21-1940

## 2017-05-31 ENCOUNTER — Ambulatory Visit: Payer: Medicare Other | Admitting: Physical Therapy

## 2017-05-31 ENCOUNTER — Encounter: Payer: Self-pay | Admitting: Physical Therapy

## 2017-05-31 DIAGNOSIS — I69354 Hemiplegia and hemiparesis following cerebral infarction affecting left non-dominant side: Secondary | ICD-10-CM | POA: Diagnosis not present

## 2017-05-31 DIAGNOSIS — R296 Repeated falls: Secondary | ICD-10-CM | POA: Diagnosis not present

## 2017-05-31 DIAGNOSIS — I6990 Unspecified sequelae of unspecified cerebrovascular disease: Secondary | ICD-10-CM | POA: Diagnosis not present

## 2017-05-31 DIAGNOSIS — R262 Difficulty in walking, not elsewhere classified: Secondary | ICD-10-CM

## 2017-05-31 DIAGNOSIS — R270 Ataxia, unspecified: Secondary | ICD-10-CM | POA: Diagnosis not present

## 2017-05-31 DIAGNOSIS — M6281 Muscle weakness (generalized): Secondary | ICD-10-CM | POA: Diagnosis not present

## 2017-05-31 NOTE — Therapy (Signed)
[address] [place], [place], [address] Phone: [phone]   Fax:  [phone]  Physical Therapy Treatment  Patient Details  Name: Karen Jackson MRN: [id number] Date of Birth: 25-Oct-1940 Referring Provider: Andria Frames  Encounter Date: 05/31/2017      PT End of Session - 05/31/17 1312    Visit Number 4   Date for PT Re-Evaluation 07/22/17   PT Start Time 1055   PT Stop Time 1145   PT Time Calculation (min) 50 min   Activity Tolerance Patient tolerated treatment well   Behavior During Therapy [facility] for tasks assessed/performed      Past Medical History:  Diagnosis Date  . Acute cystitis without hematuria   . Acute diastolic CHF (congestive heart failure) ([place])   . Arthritis   . Dyspnea   . Fever of unknown origin 03/19/2017  . Hyperlipidemia   . Hypertension   . Multifocal pneumonia   . Osteopenia   . Persistent atrial fibrillation ([place])   . Stroke [facility]) 2013    Past Surgical History:  Procedure Laterality Date  . ANKLE SURGERY    . APPENDECTOMY    . CHOLECYSTECTOMY    . HERNIA REPAIR     Esophagus  . JOINT REPLACEMENT     total- right partial- left  . MASTECTOMY PARTIAL / LUMPECTOMY  2012   left    There were no vitals filed for this visit.      Subjective Assessment - 05/31/17 1058    Subjective No reports of falls or stumbles   Currently in Pain? No/denies                         [facility] Adult PT Treatment/Exercise - 05/31/17 0001      Ambulation/Gait   Gait Comments gait HHA x 120 feet then rest and then 60 feet to the car     High Level Balance   High Level Balance Activities Side stepping;Backward walking     Knee/Hip Exercises: Aerobic   Nustep Level 4 x 7 minutes, needed cues to stay moving and to not let go of the left arm   Other Aerobic UBE level 2 x 4 minutes, again cues to keep moving     Knee/Hip Exercises: Machines for Strengthening   Cybex Knee  Extension 5# 3x10   Cybex Knee Flexion 20# 3x10     Knee/Hip Exercises: Standing   Other Standing Knee Exercises standing 2# marches and hip abduction   Other Standing Knee Exercises 4" toe clears 2x10 each leg, 2# each leg     Knee/Hip Exercises: Seated   Other Seated Knee/Hip Exercises 2# hip flexion and knee extension with 4" step clears     Manual Therapy   Manual therapy comments PROM of the left shoulder, approximation of the fingers with motions to stimulate increased function                  PT Short Term Goals - 05/31/17 1315      PT SHORT TERM GOAL #1   Title independent with initial HEP   Status Partially Met           PT Long Term Goals - 05/22/17 1325      PT LONG TERM GOAL #1   Title decrease timed up and go test to 25 seconds   Time 8   Period Weeks   Status New  PT LONG TERM GOAL #2   Title increased right LE strength to 4-/5   Time 8   Period Weeks   Status New     PT LONG TERM GOAL #3   Title walk 200 feet without rest using FWW   Time 8   Period Weeks   Status New     PT LONG TERM GOAL #4   Title no falls over a 6 week period   Time 8   Period Weeks   Status New     PT LONG TERM GOAL #5   Title report no difficulty pulling her pants up when going to the bathroom   Baseline at evaluation she required assistance, reports 2 falls in the past year while trying to pull pants up   Time 8   Period Weeks   Status New               Plan - 05/31/17 1313    Clinical Impression Statement Patient continues to fatigue with walking, requiring rest with side stepping and with 100 feet of walking.  She does have a tendency to not bear weight on the left and tends to hurry, needs cues to slow down and do better exercises   PT Next Visit Plan  get back to her PLOF, gait, safety and strength, monitor O2   Consulted and Agree with Plan of Care Patient      Patient will benefit from skilled therapeutic intervention in order to  improve the following deficits and impairments:  Abnormal gait, Cardiopulmonary status limiting activity, Decreased activity tolerance, Decreased mobility, Decreased endurance, Decreased range of motion, Decreased strength, Difficulty walking, Impaired flexibility, Impaired sensation, Improper body mechanics, Pain, Impaired UE functional use  Visit Diagnosis: Muscle weakness (generalized)  Difficulty in walking, not elsewhere classified  Repeated falls  Late effects of CVA (cerebrovascular accident)  Hemiplegia and hemiparesis following cerebral infarction affecting left non-dominant side (HCC)  Ataxia, unspecified     Problem List Patient Active Problem List   Diagnosis Date Noted  . Nausea without vomiting 05/10/2017  . Hypoxemia   . Heart failure with preserved ejection fraction ([place]), Grade 3 diastolic dysfunction 00/86/7619  . Atrial fibrillation with RVR ([place])   . Chronic diastolic CHF (congestive heart failure) ([place])   . PAF (paroxysmal atrial fibrillation) ([place])   . History of stroke   . Dyspnea 03/19/2017  . Shortness of breath 03/19/2017  . Acute cystitis without hematuria   . Fever   . Metabolic syndrome 50/93/2671  . Encounter for preventive health examination 02/17/2016  . Morbid obesity ([place]) 06/17/2015  . Fall at home 12/01/2014  . Hypomagnesemia 04/24/2014  . Hemiparesis affecting left side as late effect of cerebrovascular accident ([place]) 04/24/2014  . Nontraumatic cerebral hemorrhage ([place]) 04/30/2012  . Diabetes type 2, controlled ([place]) 03/04/2010  . OSTEOPENIA 01/21/2009  . UNSPECIFIED VITAMIN D DEFICIENCY 11/19/2007  . ESSENTIAL HYPERTENSION, BENIGN 11/19/2007  . HYPERCHOLESTEROLEMIA 10/25/2006  . GASTROESOPHAGEAL REFLUX, NO ESOPHAGITIS 10/25/2006  . DIVERTICULOSIS OF COLON 10/25/2006  . Osteoarthritis 10/25/2006  . CERVICAL SPINE DISORDER, NOS 10/25/2006    Sumner Boast. PT 05/31/2017, 1:15 PM  [address] [place], [place], [address] Phone: [phone]   Fax:  [phone]  Name: Karen Jackson MRN: [id number] Date of Birth: 1941-02-12

## 2017-06-05 ENCOUNTER — Encounter: Payer: Self-pay | Admitting: Physical Therapy

## 2017-06-05 ENCOUNTER — Ambulatory Visit: Payer: Medicare Other | Admitting: Physical Therapy

## 2017-06-05 DIAGNOSIS — I69354 Hemiplegia and hemiparesis following cerebral infarction affecting left non-dominant side: Secondary | ICD-10-CM

## 2017-06-05 DIAGNOSIS — R270 Ataxia, unspecified: Secondary | ICD-10-CM | POA: Diagnosis not present

## 2017-06-05 DIAGNOSIS — I6990 Unspecified sequelae of unspecified cerebrovascular disease: Secondary | ICD-10-CM | POA: Diagnosis not present

## 2017-06-05 DIAGNOSIS — R296 Repeated falls: Secondary | ICD-10-CM

## 2017-06-05 DIAGNOSIS — M6281 Muscle weakness (generalized): Secondary | ICD-10-CM | POA: Diagnosis not present

## 2017-06-05 DIAGNOSIS — R262 Difficulty in walking, not elsewhere classified: Secondary | ICD-10-CM | POA: Diagnosis not present

## 2017-06-05 NOTE — Therapy (Signed)
[place] [address] [place], [place], [address] Phone: [phone]   Fax:  [phone]  Physical Therapy Treatment  Patient Details  Name: Karen Jackson MRN: [id number] Date of Birth: Dec 23, 1940 Referring Provider: Andria Frames  Encounter Date: 06/05/2017      PT End of Session - 06/05/17 1155    Visit Number 5   Date for PT Re-Evaluation 07/22/17   PT Start Time 1010   PT Stop Time 1100   PT Time Calculation (min) 50 min   Activity Tolerance Patient tolerated treatment well   Behavior During Therapy [facility] for tasks assessed/performed      Past Medical History:  Diagnosis Date  . Acute cystitis without hematuria   . Acute diastolic CHF (congestive heart failure) ([place])   . Arthritis   . Dyspnea   . Fever of unknown origin 03/19/2017  . Hyperlipidemia   . Hypertension   . Multifocal pneumonia   . Osteopenia   . Persistent atrial fibrillation ([place])   . Stroke [facility]) 2013    Past Surgical History:  Procedure Laterality Date  . ANKLE SURGERY    . APPENDECTOMY    . CHOLECYSTECTOMY    . HERNIA REPAIR     Esophagus  . JOINT REPLACEMENT     total- right partial- left  . MASTECTOMY PARTIAL / LUMPECTOMY  2012   left    There were no vitals filed for this visit.      Subjective Assessment - 06/05/17 1151    Subjective Reports that she is feeling pretty good, no pain, no falls   Currently in Pain? No/denies                         [facility] Adult PT Treatment/Exercise - 06/05/17 0001      Ambulation/Gait   Gait Comments gait HHA from car to first bench ~ 60 feet, then when leaving HHA gait to bench ~ 120 feet and then to car after a rest     High Level Balance   High Level Balance Activities Side stepping;Backward walking     Knee/Hip Exercises: Aerobic   Nustep Level 5 x 8 minutes, needed cues to stay moving and to not let go of the left arm     Knee/Hip Exercises: Machines for  Strengthening   Cybex Knee Extension 5# 3x10   Cybex Knee Flexion 20# 3x10     Knee/Hip Exercises: Standing   Other Standing Knee Exercises 4" toe clears 2x10 each leg, 2# each leg     Knee/Hip Exercises: Seated   Other Seated Knee/Hip Exercises 2# hip flexion and knee extension, red tband left arm scapular stabilization two ways, left arm ER with red tband, chest press with red tband, red tband ankle DF and eversion     Manual Therapy   Manual therapy comments PROM of the left shoulder, approximation of the fingers with motions to stimulate increased function                  PT Short Term Goals - 06/05/17 1201      PT SHORT TERM GOAL #1   Title independent with initial HEP   Status Achieved           PT Long Term Goals - 06/05/17 1201      PT LONG TERM GOAL #1   Title decrease timed up and go test to 25 seconds   Status On-going  PT LONG TERM GOAL #2   Title increased right LE strength to 4-/5   Status On-going               Plan - 06/05/17 1155    Clinical Impression Statement Patient with max walk distance of 120 feet before needing breaks, at times I do feel that she self limits.  Have to watch the left ankle when she is getting on and off machines as it tends to roll   PT Next Visit Plan  get back to her PLOF, gait, safety and strength, monitor O2   Consulted and Agree with Plan of Care Patient      Patient will benefit from skilled therapeutic intervention in order to improve the following deficits and impairments:  Abnormal gait, Cardiopulmonary status limiting activity, Decreased activity tolerance, Decreased mobility, Decreased endurance, Decreased range of motion, Decreased strength, Difficulty walking, Impaired flexibility, Impaired sensation, Improper body mechanics, Pain, Impaired UE functional use  Visit Diagnosis: Muscle weakness (generalized)  Difficulty in walking, not elsewhere classified  Repeated falls  Late effects of CVA  (cerebrovascular accident)  Hemiplegia and hemiparesis following cerebral infarction affecting left non-dominant side (HCC)  Ataxia, unspecified     Problem List Patient Active Problem List   Diagnosis Date Noted  . Nausea without vomiting 05/10/2017  . Hypoxemia   . Heart failure with preserved ejection fraction ([place]), Grade 3 diastolic dysfunction 72/04/4708  . Atrial fibrillation with RVR ([place])   . Chronic diastolic CHF (congestive heart failure) ([employer])   . PAF (paroxysmal atrial fibrillation) ([place])   . History of stroke   . Dyspnea 03/19/2017  . Shortness of breath 03/19/2017  . Acute cystitis without hematuria   . Fever   . Metabolic syndrome 62/83/6629  . Encounter for preventive health examination 02/17/2016  . Morbid obesity ([place]) 06/17/2015  . Fall at home 12/01/2014  . Hypomagnesemia 04/24/2014  . Hemiparesis affecting left side as late effect of cerebrovascular accident ([place]) 04/24/2014  . Nontraumatic cerebral hemorrhage ([place]) 04/30/2012  . Diabetes type 2, controlled ([place]) 03/04/2010  . OSTEOPENIA 01/21/2009  . UNSPECIFIED VITAMIN D DEFICIENCY 11/19/2007  . ESSENTIAL HYPERTENSION, BENIGN 11/19/2007  . HYPERCHOLESTEROLEMIA 10/25/2006  . GASTROESOPHAGEAL REFLUX, NO ESOPHAGITIS 10/25/2006  . DIVERTICULOSIS OF COLON 10/25/2006  . Osteoarthritis 10/25/2006  . CERVICAL SPINE DISORDER, NOS 10/25/2006    Sumner Boast., PT 06/05/2017, 12:02 PM  [address] [place], [place], [address] Phone: [phone]   Fax:  [phone]  Name: Karen Jackson MRN: [id number] Date of Birth: 1941-08-16

## 2017-06-08 ENCOUNTER — Ambulatory Visit: Payer: Medicare Other | Admitting: Physical Therapy

## 2017-06-08 DIAGNOSIS — I6990 Unspecified sequelae of unspecified cerebrovascular disease: Secondary | ICD-10-CM

## 2017-06-08 DIAGNOSIS — M6281 Muscle weakness (generalized): Secondary | ICD-10-CM

## 2017-06-08 DIAGNOSIS — I69354 Hemiplegia and hemiparesis following cerebral infarction affecting left non-dominant side: Secondary | ICD-10-CM | POA: Diagnosis not present

## 2017-06-08 DIAGNOSIS — R296 Repeated falls: Secondary | ICD-10-CM | POA: Diagnosis not present

## 2017-06-08 DIAGNOSIS — R270 Ataxia, unspecified: Secondary | ICD-10-CM | POA: Diagnosis not present

## 2017-06-08 DIAGNOSIS — R262 Difficulty in walking, not elsewhere classified: Secondary | ICD-10-CM | POA: Diagnosis not present

## 2017-06-08 NOTE — Therapy (Signed)
[address] [place], [place], [address] Phone: [phone]   Fax:  [phone]  Physical Therapy Treatment  Patient Details  Name: Karen Jackson MRN: [id number] Date of Birth: February 05, 1941 Referring Provider: Andria Frames  Encounter Date: 06/08/2017      PT End of Session - 06/08/17 1104    Visit Number 6   Date for PT Re-Evaluation 07/22/17   PT Start Time 0930   PT Stop Time 5638   PT Time Calculation (min) 65 min      Past Medical History:  Diagnosis Date  . Acute cystitis without hematuria   . Acute diastolic CHF (congestive heart failure) ([place])   . Arthritis   . Dyspnea   . Fever of unknown origin 03/19/2017  . Hyperlipidemia   . Hypertension   . Multifocal pneumonia   . Osteopenia   . Persistent atrial fibrillation ([place])   . Stroke [facility]) 2013    Past Surgical History:  Procedure Laterality Date  . ANKLE SURGERY    . APPENDECTOMY    . CHOLECYSTECTOMY    . HERNIA REPAIR     Esophagus  . JOINT REPLACEMENT     total- right partial- left  . MASTECTOMY PARTIAL / LUMPECTOMY  2012   left    There were no vitals filed for this visit.      Subjective Assessment - 06/08/17 0944    Subjective doing okay, knee alittle sore   Currently in Pain? Yes   Pain Score 2    Pain Location Knee   Pain Orientation Left                         [facility] Adult PT Treatment/Exercise - 06/08/17 0001      Ambulation/Gait   Gait Comments amb from parking lot to clinic 200 feet with RW 1 seated rest break needed and CGA as hallway was crowded with people and this causes decreased balance.  same out of clinic to car     Knee/Hip Exercises: Aerobic   Nustep Level 5 x 8 minutes, needed cues to stay moving and to not let go of the left arm     Knee/Hip Exercises: Machines for Strengthening   Cybex Knee Extension 5# 3x10   Cybex Knee Flexion 20# 3x10     Knee/Hip Exercises: Standing   Other  Standing Knee Exercises sit to stand 5 times 2 sets   alt marching with HHA 20 times 2 sets   Other Standing Knee Exercises 4" toe clears 2x10 each leg, 2# each leg     Knee/Hip Exercises: Seated   Other Seated Knee/Hip Exercises 2# hip flexion and knee extension, red tband left arm scapular stabilization two ways, left arm ER with red tband, chest press with red tband, red tband ankle DF and eversion     Manual Therapy   Manual therapy comments PROM of the left shoulder, approximation of the fingers with motions to stimulate increased function                  PT Short Term Goals - 06/05/17 1201      PT SHORT TERM GOAL #1   Title independent with initial HEP   Status Achieved           PT Long Term Goals - 06/05/17 1201      PT LONG TERM GOAL #1   Title decrease timed up and go  test to 25 seconds   Status On-going     PT LONG TERM GOAL #2   Title increased right LE strength to 4-/5   Status On-going               Plan - 06/08/17 1105    Clinical Impression Statement 125 feet then rest d/t SOB and seated rest needed O 2 stats drop but recover quickly with rest. Assistance and cuing needed with ex.   PT Next Visit Plan  get back to her PLOF, gait, safety and strength, monitor O2      Patient will benefit from skilled therapeutic intervention in order to improve the following deficits and impairments:  Abnormal gait, Cardiopulmonary status limiting activity, Decreased activity tolerance, Decreased mobility, Decreased endurance, Decreased range of motion, Decreased strength, Difficulty walking, Impaired flexibility, Impaired sensation, Improper body mechanics, Pain, Impaired UE functional use  Visit Diagnosis: Muscle weakness (generalized)  Difficulty in walking, not elsewhere classified  Repeated falls  Late effects of CVA (cerebrovascular accident)     Problem List Patient Active Problem List   Diagnosis Date Noted  . Nausea without vomiting  05/10/2017  . Hypoxemia   . Heart failure with preserved ejection fraction ([place]), Grade 3 diastolic dysfunction 11/94/1740  . Atrial fibrillation with RVR ([place])   . Chronic diastolic CHF (congestive heart failure) ([place])   . PAF (paroxysmal atrial fibrillation) ([place])   . History of stroke   . Dyspnea 03/19/2017  . Shortness of breath 03/19/2017  . Acute cystitis without hematuria   . Fever   . Metabolic syndrome 81/44/8185  . Encounter for preventive health examination 02/17/2016  . Morbid obesity ([place]) 06/17/2015  . Fall at home 12/01/2014  . Hypomagnesemia 04/24/2014  . Hemiparesis affecting left side as late effect of cerebrovascular accident ([place]) 04/24/2014  . Nontraumatic cerebral hemorrhage ([place]) 04/30/2012  . Diabetes type 2, controlled ([place]) 03/04/2010  . OSTEOPENIA 01/21/2009  . UNSPECIFIED VITAMIN D DEFICIENCY 11/19/2007  . ESSENTIAL HYPERTENSION, BENIGN 11/19/2007  . HYPERCHOLESTEROLEMIA 10/25/2006  . GASTROESOPHAGEAL REFLUX, NO ESOPHAGITIS 10/25/2006  . DIVERTICULOSIS OF COLON 10/25/2006  . Osteoarthritis 10/25/2006  . CERVICAL SPINE DISORDER, NOS 10/25/2006    Karen Jackson,Karen Jackson 06/08/2017, 11:07 AM  [address] [place], [place], [address] Phone: [phone]   Fax:  [phone]  Name: Karen Jackson MRN: [id number] Date of Birth: 09/23/1940

## 2017-06-12 ENCOUNTER — Ambulatory Visit: Payer: Medicare Other | Admitting: Physical Therapy

## 2017-06-12 ENCOUNTER — Encounter: Payer: Self-pay | Admitting: Physical Therapy

## 2017-06-12 DIAGNOSIS — M6281 Muscle weakness (generalized): Secondary | ICD-10-CM

## 2017-06-12 DIAGNOSIS — I6990 Unspecified sequelae of unspecified cerebrovascular disease: Secondary | ICD-10-CM | POA: Diagnosis not present

## 2017-06-12 DIAGNOSIS — R296 Repeated falls: Secondary | ICD-10-CM | POA: Diagnosis not present

## 2017-06-12 DIAGNOSIS — I69354 Hemiplegia and hemiparesis following cerebral infarction affecting left non-dominant side: Secondary | ICD-10-CM

## 2017-06-12 DIAGNOSIS — R270 Ataxia, unspecified: Secondary | ICD-10-CM | POA: Diagnosis not present

## 2017-06-12 DIAGNOSIS — R262 Difficulty in walking, not elsewhere classified: Secondary | ICD-10-CM

## 2017-06-12 NOTE — Therapy (Signed)
[address] [place], [place], [address] Phone: [phone]   Fax:  [phone]  Physical Therapy Treatment  Patient Details  Name: Karen Jackson MRN: [id number] Date of Birth: [birth year] Referring Provider: Andria Frames  Encounter Date: 06/12/2017      PT End of Session - 06/12/17 1659    Visit Number 7   Date for PT Re-Evaluation 07/22/17   PT Start Time 6712   PT Stop Time 1055   PT Time Calculation (min) 53 min   Activity Tolerance Patient tolerated treatment well   Behavior During Therapy [facility] for tasks assessed/performed      Past Medical History:  Diagnosis Date  . Acute cystitis without hematuria   . Acute diastolic CHF (congestive heart failure) ([place])   . Arthritis   . Dyspnea   . Fever of unknown origin 03/19/2017  . Hyperlipidemia   . Hypertension   . Multifocal pneumonia   . Osteopenia   . Persistent atrial fibrillation ([place])   . Stroke [facility]) 2013    Past Surgical History:  Procedure Laterality Date  . ANKLE SURGERY    . APPENDECTOMY    . CHOLECYSTECTOMY    . HERNIA REPAIR     Esophagus  . JOINT REPLACEMENT     total- right partial- left  . MASTECTOMY PARTIAL / LUMPECTOMY  2012   left    There were no vitals filed for this visit.      Subjective Assessment - 06/12/17 1013    Subjective My knee is a little sore, I did more walking over the weekend, went to church and out to eat   Currently in Pain? Yes   Pain Score 2    Pain Location Knee   Pain Orientation Left   Pain Descriptors / Indicators Sore                         [facility] Adult PT Treatment/Exercise - 06/12/17 0001      Ambulation/Gait   Gait Comments amb from parking lot to clinic 200 feet with RW 1 seated rest break needed and CGA as hallway was crowded with people and this causes decreased balance.     High Level Balance   High Level Balance Activities Side stepping;Backward walking      Knee/Hip Exercises: Aerobic   Nustep Level 5 x 8 minutes   Other Aerobic UBE level 2 x 4 minutes, again cues to keep moving     Knee/Hip Exercises: Machines for Strengthening   Cybex Knee Extension 5# 3x10   Cybex Knee Flexion 20# 3x10     Knee/Hip Exercises: Standing   Other Standing Knee Exercises sit to stand 5 times 2 sets    Other Standing Knee Exercises 4" toe clears 2x10 each leg, 2# each leg     Knee/Hip Exercises: Seated   Other Seated Knee/Hip Exercises 2# hip flexion and knee extension, red tband left arm scapular stabilization two ways, left arm ER with red tband, chest press with red tband, red tband ankle DF and eversion     Knee/Hip Exercises: Sidelying   Clams 2x10     Manual Therapy   Manual therapy comments PROM of the left shoulder, approximation of the fingers with motions to stimulate increased function                  PT Short Term Goals - 06/05/17 1201  PT SHORT TERM GOAL #1   Title independent with initial HEP   Status Achieved           PT Long Term Goals - 06/05/17 1201      PT LONG TERM GOAL #1   Title decrease timed up and go test to 25 seconds   Status On-going     PT LONG TERM GOAL #2   Title increased right LE strength to 4-/5   Status On-going               Plan - 06/12/17 1700    Clinical Impression Statement Patient reports walking is getting easier, reports that she went to church and went out to eat with just some increased soreness int he knee   PT Next Visit Plan  get back to her PLOF, gait, safety and strength   Consulted and Agree with Plan of Care Patient      Patient will benefit from skilled therapeutic intervention in order to improve the following deficits and impairments:  Abnormal gait, Cardiopulmonary status limiting activity, Decreased activity tolerance, Decreased mobility, Decreased endurance, Decreased range of motion, Decreased strength, Difficulty walking, Impaired flexibility, Impaired  sensation, Improper body mechanics, Pain, Impaired UE functional use  Visit Diagnosis: Muscle weakness (generalized)  Difficulty in walking, not elsewhere classified  Repeated falls  Late effects of CVA (cerebrovascular accident)  Hemiplegia and hemiparesis following cerebral infarction affecting left non-dominant side [facility])     Problem List Patient Active Problem List   Diagnosis Date Noted  . Nausea without vomiting 05/10/2017  . Hypoxemia   . Heart failure with preserved ejection fraction ([place]), Grade 3 diastolic dysfunction 35/57/3220  . Atrial fibrillation with RVR ([place])   . Chronic diastolic CHF (congestive heart failure) ([place])   . PAF (paroxysmal atrial fibrillation) ([place])   . History of stroke   . Dyspnea 03/19/2017  . Shortness of breath 03/19/2017  . Acute cystitis without hematuria   . Fever   . Metabolic syndrome 25/42/7062  . Encounter for preventive health examination 02/17/2016  . Morbid obesity ([place]) 06/17/2015  . Fall at home 12/01/2014  . Hypomagnesemia 04/24/2014  . Hemiparesis affecting left side as late effect of cerebrovascular accident ([place]) 04/24/2014  . Nontraumatic cerebral hemorrhage ([place]) 04/30/2012  . Diabetes type 2, controlled ([place]) 03/04/2010  . OSTEOPENIA 01/21/2009  . UNSPECIFIED VITAMIN D DEFICIENCY 11/19/2007  . ESSENTIAL HYPERTENSION, BENIGN 11/19/2007  . HYPERCHOLESTEROLEMIA 10/25/2006  . GASTROESOPHAGEAL REFLUX, NO ESOPHAGITIS 10/25/2006  . DIVERTICULOSIS OF COLON 10/25/2006  . Osteoarthritis 10/25/2006  . CERVICAL SPINE DISORDER, NOS 10/25/2006    Sumner Boast., PT 06/12/2017, 5:02 PM  [place] [address] [place], [place], [address] Phone: [phone]   Fax:  [phone]  Name: Karen Jackson MRN: [id number] Date of Birth: October 28, 1940

## 2017-06-14 ENCOUNTER — Other Ambulatory Visit: Payer: Self-pay | Admitting: Family Medicine

## 2017-06-15 ENCOUNTER — Encounter: Payer: Self-pay | Admitting: Physical Therapy

## 2017-06-15 ENCOUNTER — Ambulatory Visit: Payer: Medicare Other | Admitting: Physical Therapy

## 2017-06-15 DIAGNOSIS — R262 Difficulty in walking, not elsewhere classified: Secondary | ICD-10-CM

## 2017-06-15 DIAGNOSIS — M6281 Muscle weakness (generalized): Secondary | ICD-10-CM

## 2017-06-15 DIAGNOSIS — I69354 Hemiplegia and hemiparesis following cerebral infarction affecting left non-dominant side: Secondary | ICD-10-CM

## 2017-06-15 DIAGNOSIS — R270 Ataxia, unspecified: Secondary | ICD-10-CM

## 2017-06-15 DIAGNOSIS — I6990 Unspecified sequelae of unspecified cerebrovascular disease: Secondary | ICD-10-CM

## 2017-06-15 DIAGNOSIS — R296 Repeated falls: Secondary | ICD-10-CM

## 2017-06-15 NOTE — Therapy (Signed)
[facility] [address] [place], [place], [address] Phone: [phone]   Fax:  [phone]  Physical Therapy Treatment  Patient Details  Name: Karen Jackson MRN: [id number] Date of Birth: 22-Feb-1941 Referring Provider: Andria Frames  Encounter Date: 06/15/2017      PT End of Session - 06/15/17 1150    Visit Number 8   Date for PT Re-Evaluation 07/22/17   PT Start Time 1055   PT Stop Time 1144   PT Time Calculation (min) 49 min   Activity Tolerance Patient tolerated treatment well   Behavior During Therapy [facility] for tasks assessed/performed      Past Medical History:  Diagnosis Date  . Acute cystitis without hematuria   . Acute diastolic CHF (congestive heart failure) ([place])   . Arthritis   . Dyspnea   . Fever of unknown origin 03/19/2017  . Hyperlipidemia   . Hypertension   . Multifocal pneumonia   . Osteopenia   . Persistent atrial fibrillation ([place])   . Stroke [facility]) 2013    Past Surgical History:  Procedure Laterality Date  . ANKLE SURGERY    . APPENDECTOMY    . CHOLECYSTECTOMY    . HERNIA REPAIR     Esophagus  . JOINT REPLACEMENT     total- right partial- left  . MASTECTOMY PARTIAL / LUMPECTOMY  2012   left    There were no vitals filed for this visit.      Subjective Assessment - 06/15/17 1109    Subjective Reports the left knee has continued to be sore, reports that the right knee "buckled" twice this morning   Currently in Pain? Yes   Pain Score 3    Pain Location Knee   Pain Orientation Left   Pain Descriptors / Indicators Sore   Aggravating Factors  walking more                         [facility] Adult PT Treatment/Exercise - 06/15/17 0001      Ambulation/Gait   Gait Comments ambulate 180 feet with HHA, then 90 feet and rest and then 90 more feet, does much better with cues for bigger steps     Knee/Hip Exercises: Aerobic   Nustep Level 5 x 8 minutes   Other Aerobic UBE  level 4 x 4 minutes, again cues to keep moving     Knee/Hip Exercises: Standing   Other Standing Knee Exercises sit to stand 5 times 2 sets , weight shifts and working on bearing weight through the LE's   Other Standing Knee Exercises 4" toe clears 2x10 each leg, 2# each leg     Knee/Hip Exercises: Seated   Other Seated Knee/Hip Exercises black tband TKE and leg press down                  PT Short Term Goals - 06/05/17 1201      PT SHORT TERM GOAL #1   Title independent with initial HEP   Status Achieved           PT Long Term Goals - 06/15/17 1152      PT LONG TERM GOAL #3   Title walk 200 feet without rest using FWW   Status On-going     PT LONG TERM GOAL #4   Title no falls over a 6 week period   Status On-going  Plan - 06/15/17 1150    Clinical Impression Statement Ambulation was worse today, I think it is due to the "good" knee buckling this am, her body was telling her she was not safe and she resorted to small shuffling steps and poor posture watching her feet, with cues for posture and long steps she did well   PT Next Visit Plan continue to work on abilities   Consulted and Agree with Plan of Care Patient      Patient will benefit from skilled therapeutic intervention in order to improve the following deficits and impairments:  Abnormal gait, Cardiopulmonary status limiting activity, Decreased activity tolerance, Decreased mobility, Decreased endurance, Decreased range of motion, Decreased strength, Difficulty walking, Impaired flexibility, Impaired sensation, Improper body mechanics, Pain, Impaired UE functional use  Visit Diagnosis: Muscle weakness (generalized)  Difficulty in walking, not elsewhere classified  Repeated falls  Late effects of CVA (cerebrovascular accident)  Hemiplegia and hemiparesis following cerebral infarction affecting left non-dominant side (HCC)  Ataxia, unspecified     Problem List Patient  Active Problem List   Diagnosis Date Noted  . Nausea without vomiting 05/10/2017  . Hypoxemia   . Heart failure with preserved ejection fraction ([place]), Grade 3 diastolic dysfunction 05/39/7673  . Atrial fibrillation with RVR ([place])   . Chronic diastolic CHF (congestive heart failure) ([place])   . PAF (paroxysmal atrial fibrillation) ([place])   . History of stroke   . Dyspnea 03/19/2017  . Shortness of breath 03/19/2017  . Acute cystitis without hematuria   . Fever   . Metabolic syndrome 41/93/7902  . Encounter for preventive health examination 02/17/2016  . Morbid obesity ([place]) 06/17/2015  . Fall at home 12/01/2014  . Hypomagnesemia 04/24/2014  . Hemiparesis affecting left side as late effect of cerebrovascular accident ([place]) 04/24/2014  . Nontraumatic cerebral hemorrhage ([place]) 04/30/2012  . Diabetes type 2, controlled ([place]) 03/04/2010  . OSTEOPENIA 01/21/2009  . UNSPECIFIED VITAMIN D DEFICIENCY 11/19/2007  . ESSENTIAL HYPERTENSION, BENIGN 11/19/2007  . HYPERCHOLESTEROLEMIA 10/25/2006  . GASTROESOPHAGEAL REFLUX, NO ESOPHAGITIS 10/25/2006  . DIVERTICULOSIS OF COLON 10/25/2006  . Osteoarthritis 10/25/2006  . CERVICAL SPINE DISORDER, NOS 10/25/2006    Sumner Boast., PT 06/15/2017, 11:53 AM  [facility] [place], [place], [address] Phone: [phone]   Fax:  [phone]  Name: Karen Jackson MRN: [id number] Date of Birth: 10/28/1940

## 2017-06-19 ENCOUNTER — Ambulatory Visit: Payer: Medicare Other | Admitting: Physical Therapy

## 2017-06-19 ENCOUNTER — Encounter: Payer: Self-pay | Admitting: Physical Therapy

## 2017-06-19 DIAGNOSIS — M6281 Muscle weakness (generalized): Secondary | ICD-10-CM

## 2017-06-19 DIAGNOSIS — R270 Ataxia, unspecified: Secondary | ICD-10-CM | POA: Diagnosis not present

## 2017-06-19 DIAGNOSIS — R262 Difficulty in walking, not elsewhere classified: Secondary | ICD-10-CM | POA: Diagnosis not present

## 2017-06-19 DIAGNOSIS — R296 Repeated falls: Secondary | ICD-10-CM

## 2017-06-19 DIAGNOSIS — I6990 Unspecified sequelae of unspecified cerebrovascular disease: Secondary | ICD-10-CM | POA: Diagnosis not present

## 2017-06-19 DIAGNOSIS — I69354 Hemiplegia and hemiparesis following cerebral infarction affecting left non-dominant side: Secondary | ICD-10-CM

## 2017-06-19 NOTE — Therapy (Signed)
[address] [place], [place], [address] Phone: [phone]   Fax:  [phone]  Physical Therapy Treatment  Patient Details  Name: Karen Jackson MRN: [id number] Date of Birth: 1940-12-16 Referring Provider: Andria Frames  Encounter Date: 06/19/2017      PT End of Session - 06/19/17 1512    Visit Number 9   Date for PT Re-Evaluation 07/22/17   PT Start Time 1054   PT Stop Time 1144   PT Time Calculation (min) 50 min   Activity Tolerance Patient tolerated treatment well   Behavior During Therapy [facility] for tasks assessed/performed      Past Medical History:  Diagnosis Date  . Acute cystitis without hematuria   . Acute diastolic CHF (congestive heart failure) ([place])   . Arthritis   . Dyspnea   . Fever of unknown origin 03/19/2017  . Hyperlipidemia   . Hypertension   . Multifocal pneumonia   . Osteopenia   . Persistent atrial fibrillation ([place])   . Stroke [facility]) 2013    Past Surgical History:  Procedure Laterality Date  . ANKLE SURGERY    . APPENDECTOMY    . CHOLECYSTECTOMY    . HERNIA REPAIR     Esophagus  . JOINT REPLACEMENT     total- right partial- left  . MASTECTOMY PARTIAL / LUMPECTOMY  2012   left    There were no vitals filed for this visit.      Subjective Assessment - 06/19/17 1305    Subjective I felt really good until this morning, less pain and walked better, today some increase of soreness in the knees   Currently in Pain? Yes   Pain Score 3    Pain Location Knee   Pain Orientation Left                         [facility] Adult PT Treatment/Exercise - 06/19/17 0001      Transfers   Comments (P)  worked a lot on bed mobility, side rolling, up from supine to sit     Ambulation/Gait   Gait Comments (P)  ambulate 180 feet with HHA, then 90 feet and rest and then 90 more feet, does much better with cues for bigger steps     High Level Balance   High Level Balance  Activities (P)  Side stepping;Backward walking     Knee/Hip Exercises: Aerobic   Nustep (P)  Level 5 x 8 minutes   Other Aerobic (P)  UBE level 4 x 4 minutes, again cues to keep moving     Knee/Hip Exercises: Standing   Other Standing Knee Exercises (P)  4" toe clears 2x10 each leg, 2# each leg     Knee/Hip Exercises: Seated   Other Seated Knee/Hip Exercises (P)  black tband TKE and leg press down                  PT Short Term Goals - 06/05/17 1201      PT SHORT TERM GOAL #1   Title independent with initial HEP   Status Achieved           PT Long Term Goals - 06/15/17 1152      PT LONG TERM GOAL #3   Title walk 200 feet without rest using FWW   Status On-going     PT LONG TERM GOAL #4   Title no falls over a 6 week  period   Status On-going               Plan - 06/19/17 1512    Clinical Impression Statement Patient did very well today with gait, did tend to pur a lot of weight on her arms, better step length.  She does struggle with the functional bed mobility and transfers    PT Next Visit Plan continue to work on functional ability   Consulted and Agree with Plan of Care Patient      Patient will benefit from skilled therapeutic intervention in order to improve the following deficits and impairments:  Abnormal gait, Cardiopulmonary status limiting activity, Decreased activity tolerance, Decreased mobility, Decreased endurance, Decreased range of motion, Decreased strength, Difficulty walking, Impaired flexibility, Impaired sensation, Improper body mechanics, Pain, Impaired UE functional use  Visit Diagnosis: Muscle weakness (generalized)  Difficulty in walking, not elsewhere classified  Repeated falls  Late effects of CVA (cerebrovascular accident)  Hemiplegia and hemiparesis following cerebral infarction affecting left non-dominant side (HCC)  Ataxia, unspecified     Problem List Patient Active Problem List   Diagnosis Date Noted  .  Nausea without vomiting 05/10/2017  . Hypoxemia   . Heart failure with preserved ejection fraction ([place]), Grade 3 diastolic dysfunction 42/59/5638  . Atrial fibrillation with RVR ([place])   . Chronic diastolic CHF (congestive heart failure) ([place])   . PAF (paroxysmal atrial fibrillation) ([place])   . History of stroke   . Dyspnea 03/19/2017  . Shortness of breath 03/19/2017  . Acute cystitis without hematuria   . Fever   . Metabolic syndrome 75/64/3329  . Encounter for preventive health examination 02/17/2016  . Morbid obesity ([place]) 06/17/2015  . Fall at home 12/01/2014  . Hypomagnesemia 04/24/2014  . Hemiparesis affecting left side as late effect of cerebrovascular accident ([place]) 04/24/2014  . Nontraumatic cerebral hemorrhage ([place]) 04/30/2012  . Diabetes type 2, controlled ([place]) 03/04/2010  . OSTEOPENIA 01/21/2009  . UNSPECIFIED VITAMIN D DEFICIENCY 11/19/2007  . ESSENTIAL HYPERTENSION, BENIGN 11/19/2007  . HYPERCHOLESTEROLEMIA 10/25/2006  . GASTROESOPHAGEAL REFLUX, NO ESOPHAGITIS 10/25/2006  . DIVERTICULOSIS OF COLON 10/25/2006  . Osteoarthritis 10/25/2006  . CERVICAL SPINE DISORDER, NOS 10/25/2006    Sumner Boast., PT 06/19/2017, 3:14 PM  [place] [address] [place], [place], [address] Phone: [phone]   Fax:  [phone]  Name: JULIANAH MARCIEL MRN: [id number] Date of Birth: [birth year]

## 2017-06-21 ENCOUNTER — Encounter: Payer: Self-pay | Admitting: Physical Therapy

## 2017-06-21 ENCOUNTER — Ambulatory Visit: Payer: Medicare Other | Admitting: Physical Therapy

## 2017-06-21 DIAGNOSIS — R270 Ataxia, unspecified: Secondary | ICD-10-CM

## 2017-06-21 DIAGNOSIS — R296 Repeated falls: Secondary | ICD-10-CM | POA: Diagnosis not present

## 2017-06-21 DIAGNOSIS — I69354 Hemiplegia and hemiparesis following cerebral infarction affecting left non-dominant side: Secondary | ICD-10-CM

## 2017-06-21 DIAGNOSIS — M6281 Muscle weakness (generalized): Secondary | ICD-10-CM

## 2017-06-21 DIAGNOSIS — R262 Difficulty in walking, not elsewhere classified: Secondary | ICD-10-CM

## 2017-06-21 DIAGNOSIS — I6990 Unspecified sequelae of unspecified cerebrovascular disease: Secondary | ICD-10-CM | POA: Diagnosis not present

## 2017-06-21 NOTE — Therapy (Signed)
[address] [place], [place], [address] Phone: [phone]   Fax:  [phone]  Physical Therapy Treatment  Patient Details  Name: Karen Jackson MRN: [id number] Date of Birth: 1941/01/12 Referring Provider: Andria Frames  Encounter Date: 06/21/2017      PT End of Session - 06/21/17 1131    Visit Number 10   Date for PT Re-Evaluation 07/22/17   PT Start Time 1010   PT Stop Time 1055   PT Time Calculation (min) 45 min   Activity Tolerance Patient tolerated treatment well   Behavior During Therapy [facility] for tasks assessed/performed      Past Medical History:  Diagnosis Date  . Acute cystitis without hematuria   . Acute diastolic CHF (congestive heart failure) ([place])   . Arthritis   . Dyspnea   . Fever of unknown origin 03/19/2017  . Hyperlipidemia   . Hypertension   . Multifocal pneumonia   . Osteopenia   . Persistent atrial fibrillation ([place])   . Stroke [facility]) 2013    Past Surgical History:  Procedure Laterality Date  . ANKLE SURGERY    . APPENDECTOMY    . CHOLECYSTECTOMY    . HERNIA REPAIR     Esophagus  . JOINT REPLACEMENT     total- right partial- left  . MASTECTOMY PARTIAL / LUMPECTOMY  2012   left    There were no vitals filed for this visit.      Subjective Assessment - 06/21/17 1108    Subjective Reports that she has been moving better and sleeping better, reports today she is stiff in all her joints                         [facility] Adult PT Treatment/Exercise - 06/21/17 0001      Transfers   Comments worked a lot on bed mobility, side rolling, up from supine to sit     Knee/Hip Exercises: Aerobic   Nustep Level 5 x 8 minutes   Other Aerobic UBE level 4 x 4 minutes, again cues to keep moving     Knee/Hip Exercises: Standing   Other Standing Knee Exercises sit to stand 5 times 2 sets , weight shifts and working on bearing weight through the LE's   Other Standing  Knee Exercises 4" toe clears 2x10 each leg, 2# each leg     Knee/Hip Exercises: Seated   Abduction/Adduction  3 sets;10 reps   Abd/Adduction Limitations 2 #     Knee/Hip Exercises: Supine   Bridges with Ball Squeeze 2 sets;15 reps   Straight Leg Raises 2 sets;10 reps   Other Supine Knee/Hip Exercises red tband DF and eversion, 2# left hand punches and flexion, blue tband hip extension, 2# SAQ's and trunk rotations   Other Supine Knee/Hip Exercises hip abduction with sliding board, truunk mobility                  PT Short Term Goals - 06/05/17 1201      PT SHORT TERM GOAL #1   Title independent with initial HEP   Status Achieved           PT Long Term Goals - 06/21/17 1139      PT LONG TERM GOAL #1   Title decrease timed up and go test to 25 seconds   Status On-going     PT LONG TERM GOAL #4   Title no falls  over a 6 week period   Status Partially Met               Plan - 06/30/17 1138    Clinical Impression Statement Good step length today, some cues needed for posture, good bed mobility   PT Next Visit Plan continue to work on functional ability   Consulted and Agree with Plan of Care Patient      Patient will benefit from skilled therapeutic intervention in order to improve the following deficits and impairments:  Abnormal gait, Cardiopulmonary status limiting activity, Decreased activity tolerance, Decreased mobility, Decreased endurance, Decreased range of motion, Decreased strength, Difficulty walking, Impaired flexibility, Impaired sensation, Improper body mechanics, Pain, Impaired UE functional use  Visit Diagnosis: Muscle weakness (generalized)  Difficulty in walking, not elsewhere classified  Repeated falls  Late effects of CVA (cerebrovascular accident)  Hemiplegia and hemiparesis following cerebral infarction affecting left non-dominant side (HCC)  Ataxia, unspecified       G-Codes - 30-Jun-2017 1139    Functional Assessment  Tool Used (Outpatient Only) foto 66% limited   Functional Limitation Mobility: Walking and moving around   Mobility: Walking and Moving Around Current Status [phone]) At least 60 percent but less than 80 percent impaired, limited or restricted   Mobility: Walking and Moving Around Goal Status [phone]) At least 40 percent but less than 60 percent impaired, limited or restricted      Problem List Patient Active Problem List   Diagnosis Date Noted  . Nausea without vomiting 05/10/2017  . Hypoxemia   . Heart failure with preserved ejection fraction ([place]), Grade 3 diastolic dysfunction 25/63/8937  . Atrial fibrillation with RVR ([place])   . Chronic diastolic CHF (congestive heart failure) ([place])   . PAF (paroxysmal atrial fibrillation) ([place])   . History of stroke   . Dyspnea 03/19/2017  . Shortness of breath 03/19/2017  . Acute cystitis without hematuria   . Fever   . Metabolic syndrome 34/28/7681  . Encounter for preventive health examination 02/17/2016  . Morbid obesity ([place]) 06/17/2015  . Fall at home 12/01/2014  . Hypomagnesemia 04/24/2014  . Hemiparesis affecting left side as late effect of cerebrovascular accident ([place]) 04/24/2014  . Nontraumatic cerebral hemorrhage ([place]) 04/30/2012  . Diabetes type 2, controlled ([place]) 03/04/2010  . OSTEOPENIA 01/21/2009  . UNSPECIFIED VITAMIN D DEFICIENCY 11/19/2007  . ESSENTIAL HYPERTENSION, BENIGN 11/19/2007  . HYPERCHOLESTEROLEMIA 10/25/2006  . GASTROESOPHAGEAL REFLUX, NO ESOPHAGITIS 10/25/2006  . DIVERTICULOSIS OF COLON 10/25/2006  . Osteoarthritis 10/25/2006  . CERVICAL SPINE DISORDER, NOS 10/25/2006    Sumner Boast., PT 2017/06/30, 11:40 AM  [address] [address] [place], [place], [address] Phone: [phone]   Fax:  [phone]  Name: Karen Jackson MRN: [id number] Date of Birth: July 04, 1941

## 2017-06-26 ENCOUNTER — Encounter: Payer: Self-pay | Admitting: Physical Therapy

## 2017-06-26 ENCOUNTER — Ambulatory Visit: Payer: Medicare Other | Admitting: Physical Therapy

## 2017-06-26 DIAGNOSIS — R270 Ataxia, unspecified: Secondary | ICD-10-CM | POA: Diagnosis not present

## 2017-06-26 DIAGNOSIS — I69354 Hemiplegia and hemiparesis following cerebral infarction affecting left non-dominant side: Secondary | ICD-10-CM

## 2017-06-26 DIAGNOSIS — I6990 Unspecified sequelae of unspecified cerebrovascular disease: Secondary | ICD-10-CM

## 2017-06-26 DIAGNOSIS — R296 Repeated falls: Secondary | ICD-10-CM

## 2017-06-26 DIAGNOSIS — M6281 Muscle weakness (generalized): Secondary | ICD-10-CM | POA: Diagnosis not present

## 2017-06-26 DIAGNOSIS — R262 Difficulty in walking, not elsewhere classified: Secondary | ICD-10-CM | POA: Diagnosis not present

## 2017-06-26 NOTE — Therapy (Signed)
[place] [address] [place], [place], [address] Phone: [phone]   Fax:  [phone]  Physical Therapy Treatment  Patient Details  Name: TARYN SHELLHAMMER MRN: [id number] Date of Birth: Apr 29, 1941 Referring Provider: Andria Frames  Encounter Date: 06/26/2017      PT End of Session - 06/26/17 1131    Visit Number 11   Date for PT Re-Evaluation 07/22/17   PT Start Time 1010   PT Stop Time 1052   PT Time Calculation (min) 42 min   Activity Tolerance Patient tolerated treatment well   Behavior During Therapy [facility] for tasks assessed/performed      Past Medical History:  Diagnosis Date  . Acute cystitis without hematuria   . Acute diastolic CHF (congestive heart failure) ([place])   . Arthritis   . Dyspnea   . Fever of unknown origin 03/19/2017  . Hyperlipidemia   . Hypertension   . Multifocal pneumonia   . Osteopenia   . Persistent atrial fibrillation ([place])   . Stroke [facility]) 2013    Past Surgical History:  Procedure Laterality Date  . ANKLE SURGERY    . APPENDECTOMY    . CHOLECYSTECTOMY    . HERNIA REPAIR     Esophagus  . JOINT REPLACEMENT     total- right partial- left  . MASTECTOMY PARTIAL / LUMPECTOMY  2012   left    There were no vitals filed for this visit.      Subjective Assessment - 06/26/17 1029    Subjective Reports that she has been doing well, no stumbles or falls, she has been able to walk into the church two of three times   Currently in Pain? No/denies                         [facility] Adult PT Treatment/Exercise - 06/26/17 0001      Ambulation/Gait   Gait Comments ambulate 180 feet with HHA, then 90 feet and rest and then 90 more feet, does much better with cues for bigger steps     High Level Balance   High Level Balance Activities Side stepping;Backward walking     Knee/Hip Exercises: Aerobic   Nustep Level 5 x 8 minutes     Knee/Hip Exercises: Machines for  Strengthening   Cybex Knee Extension 5# 3x10   Cybex Knee Flexion 20# 3x10   Cybex Leg Press 20# 2x10 with cues for control of the left knee, then no weight left leg only     Knee/Hip Exercises: Standing   Other Standing Knee Exercises sit to stand 5 times 2 sets , weight shifts and working on bearing weight through the LE's   Other Standing Knee Exercises 6" toe clears 2x10 each leg, 2# each leg     Knee/Hip Exercises: Seated   Abduction/Adduction  3 sets;10 reps   Abd/Adduction Limitations 2 #                  PT Short Term Goals - 06/05/17 1201      PT SHORT TERM GOAL #1   Title independent with initial HEP   Status Achieved           PT Long Term Goals - 06/21/17 1139      PT LONG TERM GOAL #1   Title decrease timed up and go test to 25 seconds   Status On-going     PT LONG TERM GOAL #4  Title no falls over a 6 week period   Status Partially Met               Plan - 06/26/17 1131    Clinical Impression Statement Patient had a lot of difficulty today with the left leg, she seemed tired and the leg would go into flexion with any stnading or walking, she had to rest witht he walk in and out due to left leg weakness   PT Next Visit Plan continue to work on functional mobility      Patient will benefit from skilled therapeutic intervention in order to improve the following deficits and impairments:  Abnormal gait, Cardiopulmonary status limiting activity, Decreased activity tolerance, Decreased mobility, Decreased endurance, Decreased range of motion, Decreased strength, Difficulty walking, Impaired flexibility, Impaired sensation, Improper body mechanics, Pain, Impaired UE functional use  Visit Diagnosis: Muscle weakness (generalized)  Difficulty in walking, not elsewhere classified  Repeated falls  Late effects of CVA (cerebrovascular accident)  Hemiplegia and hemiparesis following cerebral infarction affecting left non-dominant side  (HCC)  Ataxia, unspecified     Problem List Patient Active Problem List   Diagnosis Date Noted  . Nausea without vomiting 05/10/2017  . Hypoxemia   . Heart failure with preserved ejection fraction ([place]), Grade 3 diastolic dysfunction 17/47/1595  . Atrial fibrillation with RVR ([place])   . Chronic diastolic CHF (congestive heart failure) ([place])   . PAF (paroxysmal atrial fibrillation) ([place])   . History of stroke   . Dyspnea 03/19/2017  . Shortness of breath 03/19/2017  . Acute cystitis without hematuria   . Fever   . Metabolic syndrome 39/67/2897  . Encounter for preventive health examination 02/17/2016  . Morbid obesity ([place]) 06/17/2015  . Fall at home 12/01/2014  . Hypomagnesemia 04/24/2014  . Hemiparesis affecting left side as late effect of cerebrovascular accident ([place]) 04/24/2014  . Nontraumatic cerebral hemorrhage ([place]) 04/30/2012  . Diabetes type 2, controlled ([place]) 03/04/2010  . OSTEOPENIA 01/21/2009  . UNSPECIFIED VITAMIN D DEFICIENCY 11/19/2007  . ESSENTIAL HYPERTENSION, BENIGN 11/19/2007  . HYPERCHOLESTEROLEMIA 10/25/2006  . GASTROESOPHAGEAL REFLUX, NO ESOPHAGITIS 10/25/2006  . DIVERTICULOSIS OF COLON 10/25/2006  . Osteoarthritis 10/25/2006  . CERVICAL SPINE DISORDER, NOS 10/25/2006    Sumner Boast., PT 06/26/2017, 11:33 AM  [place] [address] [place], [place], [address] Phone: [phone]   Fax:  [phone]  Name: DEARIA WILMOUTH MRN: [id number] Date of Birth: 09-04-1940

## 2017-06-28 ENCOUNTER — Encounter: Payer: Self-pay | Admitting: Physical Therapy

## 2017-06-28 ENCOUNTER — Ambulatory Visit: Payer: Medicare Other | Attending: Family Medicine | Admitting: Physical Therapy

## 2017-06-28 DIAGNOSIS — R270 Ataxia, unspecified: Secondary | ICD-10-CM | POA: Insufficient documentation

## 2017-06-28 DIAGNOSIS — R262 Difficulty in walking, not elsewhere classified: Secondary | ICD-10-CM | POA: Insufficient documentation

## 2017-06-28 DIAGNOSIS — I69354 Hemiplegia and hemiparesis following cerebral infarction affecting left non-dominant side: Secondary | ICD-10-CM

## 2017-06-28 DIAGNOSIS — I6990 Unspecified sequelae of unspecified cerebrovascular disease: Secondary | ICD-10-CM | POA: Diagnosis not present

## 2017-06-28 DIAGNOSIS — R296 Repeated falls: Secondary | ICD-10-CM | POA: Insufficient documentation

## 2017-06-28 DIAGNOSIS — M6281 Muscle weakness (generalized): Secondary | ICD-10-CM | POA: Diagnosis not present

## 2017-06-28 NOTE — Therapy (Signed)
[address] [place], [place], [address] Phone: [phone]   Fax:  [phone]  Physical Therapy Treatment  Patient Details  Name: Karen Jackson MRN: [id number] Date of Birth: 01/20/1941 Referring Provider: Andria Frames  Encounter Date: 06/28/2017      PT End of Session - 06/28/17 1300    Visit Number 12   Date for PT Re-Evaluation 07/22/17   PT Start Time 1057   PT Stop Time 1143   PT Time Calculation (min) 46 min   Activity Tolerance Patient tolerated treatment well   Behavior During Therapy [facility] for tasks assessed/performed      Past Medical History:  Diagnosis Date  . Acute cystitis without hematuria   . Acute diastolic CHF (congestive heart failure) ([place])   . Arthritis   . Dyspnea   . Fever of unknown origin 03/19/2017  . Hyperlipidemia   . Hypertension   . Multifocal pneumonia   . Osteopenia   . Persistent atrial fibrillation ([place])   . Stroke [facility]) 2013    Past Surgical History:  Procedure Laterality Date  . ANKLE SURGERY    . APPENDECTOMY    . CHOLECYSTECTOMY    . HERNIA REPAIR     Esophagus  . JOINT REPLACEMENT     total- right partial- left  . MASTECTOMY PARTIAL / LUMPECTOMY  2012   left    There were no vitals filed for this visit.      Subjective Assessment - 06/28/17 1257    Subjective Reports that her left leg feels heavy and weak, reports that she went out to lunch yesterday   Currently in Pain? No/denies                         [facility] Adult PT Treatment/Exercise - 06/28/17 0001      Ambulation/Gait   Gait Comments Ambulate HHA 180 feet x 2 today, no rest breaks during ambulation, just cues to pick up feet and relax UE's as she tends to tense up significantly     High Level Balance   High Level Balance Activities Side stepping;Backward walking     Knee/Hip Exercises: Aerobic   Other Aerobic UBE level 4 x 4 minutes, again cues to keep moving     Knee/Hip Exercises: Standing   Other Standing Knee Exercises 6" toe clears 2x10 each leg, 2# each leg     Knee/Hip Exercises: Seated   Other Seated Knee/Hip Exercises 2# LAQ, 2# hip flexion, green tband knee flexion, red tband ankld DF and eversion, while sitting worked on left UE functional reaching and use with cones                  PT Short Term Goals - 06/05/17 1201      PT SHORT TERM GOAL #1   Title independent with initial HEP   Status Achieved           PT Long Term Goals - 06/28/17 1302      PT LONG TERM GOAL #1   Title decrease timed up and go test to 25 seconds   Status On-going     PT LONG TERM GOAL #2   Title increased right LE strength to 4-/5   Status On-going     PT LONG TERM GOAL #3   Title walk 200 feet without rest using FWW   Status Partially Met  Plan - 06/28/17 1301    Clinical Impression Statement Did better today than last visit with the left leg but I did back off of any standing activities.  She does have difficulty with use of the left UE and seems to neglect this side   PT Next Visit Plan encouraged to work on her hand at home and try to not ask for help if she can do things   Consulted and Agree with Plan of Care Patient      Patient will benefit from skilled therapeutic intervention in order to improve the following deficits and impairments:  Abnormal gait, Cardiopulmonary status limiting activity, Decreased activity tolerance, Decreased mobility, Decreased endurance, Decreased range of motion, Decreased strength, Difficulty walking, Impaired flexibility, Impaired sensation, Improper body mechanics, Pain, Impaired UE functional use  Visit Diagnosis: Muscle weakness (generalized)  Difficulty in walking, not elsewhere classified  Repeated falls  Late effects of CVA (cerebrovascular accident)  Hemiplegia and hemiparesis following cerebral infarction affecting left non-dominant side (HCC)  Ataxia,  unspecified     Problem List Patient Active Problem List   Diagnosis Date Noted  . Nausea without vomiting 05/10/2017  . Hypoxemia   . Heart failure with preserved ejection fraction ([place]), Grade 3 diastolic dysfunction 74/82/7078  . Atrial fibrillation with RVR ([place])   . Chronic diastolic CHF (congestive heart failure) ([place])   . PAF (paroxysmal atrial fibrillation) ([place])   . History of stroke   . Dyspnea 03/19/2017  . Shortness of breath 03/19/2017  . Acute cystitis without hematuria   . Fever   . Metabolic syndrome 67/54/4920  . Encounter for preventive health examination 02/17/2016  . Morbid obesity ([place]) 06/17/2015  . Fall at home 12/01/2014  . Hypomagnesemia 04/24/2014  . Hemiparesis affecting left side as late effect of cerebrovascular accident ([place]) 04/24/2014  . Nontraumatic cerebral hemorrhage ([place]) 04/30/2012  . Diabetes type 2, controlled ([place]) 03/04/2010  . OSTEOPENIA 01/21/2009  . UNSPECIFIED VITAMIN D DEFICIENCY 11/19/2007  . ESSENTIAL HYPERTENSION, BENIGN 11/19/2007  . HYPERCHOLESTEROLEMIA 10/25/2006  . GASTROESOPHAGEAL REFLUX, NO ESOPHAGITIS 10/25/2006  . DIVERTICULOSIS OF COLON 10/25/2006  . Osteoarthritis 10/25/2006  . CERVICAL SPINE DISORDER, NOS 10/25/2006    Sumner Boast., PT 06/28/2017, 1:03 PM  [address] [place], [place], [address] Phone: [phone]   Fax:  [phone]  Name: KHUSHI ZUPKO MRN: [id number] Date of Birth: 01-May-1941

## 2017-07-05 ENCOUNTER — Ambulatory Visit: Payer: Medicare Other | Admitting: Physical Therapy

## 2017-07-05 ENCOUNTER — Telehealth: Payer: Self-pay | Admitting: *Deleted

## 2017-07-05 DIAGNOSIS — N390 Urinary tract infection, site not specified: Secondary | ICD-10-CM | POA: Insufficient documentation

## 2017-07-05 MED ORDER — CEPHALEXIN 500 MG PO CAPS: 500.0000 mg | ORAL_CAPSULE | Freq: Two times a day (BID) | ORAL | 0 refills | Status: AC

## 2017-07-05 NOTE — Telephone Encounter (Signed)
Patient left message on voicemail stating she is sure that she has a UTI and wants to know if MD would be ok with her caregiver bringing in her urine sample since it is so hard for her to get here.

## 2017-07-05 NOTE — Telephone Encounter (Signed)
Frequency and urgency.  No fever, nausea, vomiting or systemic sx.   Given transportation difficulty, will Rx empirically for uncomplicated UTI.  Knows to be seen if worsens or fails to improve.

## 2017-07-10 ENCOUNTER — Encounter: Payer: Self-pay | Admitting: Physical Therapy

## 2017-07-10 ENCOUNTER — Ambulatory Visit: Payer: Medicare Other | Admitting: Physical Therapy

## 2017-07-10 DIAGNOSIS — M6281 Muscle weakness (generalized): Secondary | ICD-10-CM | POA: Diagnosis not present

## 2017-07-10 DIAGNOSIS — R262 Difficulty in walking, not elsewhere classified: Secondary | ICD-10-CM | POA: Diagnosis not present

## 2017-07-10 DIAGNOSIS — R296 Repeated falls: Secondary | ICD-10-CM | POA: Diagnosis not present

## 2017-07-10 DIAGNOSIS — R270 Ataxia, unspecified: Secondary | ICD-10-CM

## 2017-07-10 DIAGNOSIS — I69354 Hemiplegia and hemiparesis following cerebral infarction affecting left non-dominant side: Secondary | ICD-10-CM

## 2017-07-10 DIAGNOSIS — I6990 Unspecified sequelae of unspecified cerebrovascular disease: Secondary | ICD-10-CM | POA: Diagnosis not present

## 2017-07-10 NOTE — Therapy (Signed)
[address] [place] [address] [place], [place], [address] Phone: [phone]   Fax:  [phone]  Physical Therapy Treatment  Patient Details  Name: Karen Jackson MRN: [id number] Date of Birth: 1941/04/21 Referring Provider: Andria Frames   Encounter Date: 07/10/2017  PT End of Session - 07/10/17 1202    Visit Number  13    Date for PT Re-Evaluation  07/22/17    PT Start Time  1058    PT Stop Time  1144    PT Time Calculation (min)  46 min    Activity Tolerance  Patient limited by fatigue    Behavior During Therapy  [facility] for tasks assessed/performed       Past Medical History:  Diagnosis Date  . Acute cystitis without hematuria   . Acute diastolic CHF (congestive heart failure) ([place])   . Arthritis   . Dyspnea   . Fever of unknown origin 03/19/2017  . Hyperlipidemia   . Hypertension   . Multifocal pneumonia   . Osteopenia   . Persistent atrial fibrillation ([place])   . Stroke [facility]) 2013    Past Surgical History:  Procedure Laterality Date  . ANKLE SURGERY    . APPENDECTOMY    . CHOLECYSTECTOMY    . HERNIA REPAIR     Esophagus  . JOINT REPLACEMENT     total- right partial- left  . MASTECTOMY PARTIAL / LUMPECTOMY  2012   left    There were no vitals filed for this visit.  Subjective Assessment - 07/10/17 1118    Subjective  Reports that the cool and damp weather has caused her to have some increased pain and stiffness, reports that she feels like she is doing worse since she did not get to come in last week due to a UTI    Currently in Pain?  No/denies                      [facility] Adult PT Treatment/Exercise - 07/10/17 0001      Ambulation/Gait   Gait Comments  HHA 180 feet into the gym without rest, after treatment, and then HHA out to car she requiired five rest breaks, c/o shortness of breath and fatigue      High Level Balance   High Level Balance Activities  Side stepping;Backward walking       Knee/Hip Exercises: Aerobic   Nustep  Level 5 x 8 minutes      Knee/Hip Exercises: Standing   Other Standing Knee Exercises  sit to stand 5 times 2 sets , weight shifts and working on bearing weight through the LE's, 2# hip abduction    Other Standing Knee Exercises  6" toe clears 2x10 each leg, 2# each leg      Knee/Hip Exercises: Seated   Other Seated Knee/Hip Exercises  2# LAQ, 2# hip flexion, green tband knee flexion, red tband ankld DF and eversion, while sitting worked on left UE functional reaching and use with cones               PT Short Term Goals - 06/05/17 1201      PT SHORT TERM GOAL #1   Title  independent with initial HEP    Status  Achieved        PT Long Term Goals - 06/28/17 1302      PT LONG TERM GOAL #1   Title  decrease timed up and go test to 25  seconds    Status  On-going      PT LONG TERM GOAL #2   Title  increased right LE strength to 4-/5    Status  On-going      PT LONG TERM GOAL #3   Title  walk 200 feet without rest using FWW    Status  Partially Met            Plan - 07/10/17 1203    Clinical Impression Statement  Patient with some decreased function today, she missed PT last week due to UTI.  She had more c/o aches and pains in her joints and there was palpable crepitus.  She required 5 rest breaks to walk to the car today due to fatigue    PT Next Visit Plan  She seems to hvae regressed some due to UTI    Consulted and Agree with Plan of Care  Patient       Patient will benefit from skilled therapeutic intervention in order to improve the following deficits and impairments:  Abnormal gait, Cardiopulmonary status limiting activity, Decreased activity tolerance, Decreased mobility, Decreased endurance, Decreased range of motion, Decreased strength, Difficulty walking, Impaired flexibility, Impaired sensation, Improper body mechanics, Pain, Impaired UE functional use  Visit Diagnosis: Muscle weakness  (generalized)  Difficulty in walking, not elsewhere classified  Repeated falls  Late effects of CVA (cerebrovascular accident)  Hemiplegia and hemiparesis following cerebral infarction affecting left non-dominant side (HCC)  Ataxia, unspecified     Problem List Patient Active Problem List   Diagnosis Date Noted  . UTI (urinary tract infection) 07/05/2017  . Nausea without vomiting 05/10/2017  . Hypoxemia   . Heart failure with preserved ejection fraction ([place]), Grade 3 diastolic dysfunction 81/05/3158  . Atrial fibrillation with RVR ([place])   . Chronic diastolic CHF (congestive heart failure) ([place])   . PAF (paroxysmal atrial fibrillation) ([place])   . History of stroke   . Dyspnea 03/19/2017  . Shortness of breath 03/19/2017  . Acute cystitis without hematuria   . Fever   . Metabolic syndrome 45/85/9292  . Encounter for preventive health examination 02/17/2016  . Morbid obesity ([place]) 06/17/2015  . Fall at home 12/01/2014  . Hypomagnesemia 04/24/2014  . Hemiparesis affecting left side as late effect of cerebrovascular accident ([place]) 04/24/2014  . Nontraumatic cerebral hemorrhage ([place]) 04/30/2012  . Diabetes type 2, controlled ([place]) 03/04/2010  . OSTEOPENIA 01/21/2009  . UNSPECIFIED VITAMIN D DEFICIENCY 11/19/2007  . ESSENTIAL HYPERTENSION, BENIGN 11/19/2007  . HYPERCHOLESTEROLEMIA 10/25/2006  . GASTROESOPHAGEAL REFLUX, NO ESOPHAGITIS 10/25/2006  . DIVERTICULOSIS OF COLON 10/25/2006  . Osteoarthritis 10/25/2006  . CERVICAL SPINE DISORDER, NOS 10/25/2006    Sumner Boast., PT 07/10/2017, 12:05 PM  [address] [place], [place], [address] Phone: [phone]   Fax:  [phone]  Name: Karen Jackson MRN: [id number] Date of Birth: 27-Sep-1940

## 2017-07-12 ENCOUNTER — Encounter: Payer: Self-pay | Admitting: Physical Therapy

## 2017-07-12 ENCOUNTER — Ambulatory Visit: Payer: Medicare Other | Admitting: Physical Therapy

## 2017-07-12 DIAGNOSIS — R296 Repeated falls: Secondary | ICD-10-CM | POA: Diagnosis not present

## 2017-07-12 DIAGNOSIS — M6281 Muscle weakness (generalized): Secondary | ICD-10-CM | POA: Diagnosis not present

## 2017-07-12 DIAGNOSIS — I6990 Unspecified sequelae of unspecified cerebrovascular disease: Secondary | ICD-10-CM

## 2017-07-12 DIAGNOSIS — R270 Ataxia, unspecified: Secondary | ICD-10-CM

## 2017-07-12 DIAGNOSIS — I69354 Hemiplegia and hemiparesis following cerebral infarction affecting left non-dominant side: Secondary | ICD-10-CM

## 2017-07-12 DIAGNOSIS — R262 Difficulty in walking, not elsewhere classified: Secondary | ICD-10-CM | POA: Diagnosis not present

## 2017-07-12 NOTE — Therapy (Signed)
[address] [place], [place], [address] Phone: [phone]   Fax:  [phone]  Physical Therapy Treatment  Patient Details  Name: Karen Jackson MRN: [id number] Date of Birth: 1941/05/02 Referring Provider: Andria Frames   Encounter Date: 07/12/2017  PT End of Session - 07/12/17 1215    Visit Number  14    Date for PT Re-Evaluation  07/22/17    PT Start Time  1056 limited by pain and knee giving way    PT Stop Time  1130    PT Time Calculation (min)  34 min    Activity Tolerance  Patient limited by pain    Behavior During Therapy  [facility] for tasks assessed/performed       Past Medical History:  Diagnosis Date  . Acute cystitis without hematuria   . Acute diastolic CHF (congestive heart failure) ([place])   . Arthritis   . Dyspnea   . Fever of unknown origin 03/19/2017  . Hyperlipidemia   . Hypertension   . Multifocal pneumonia   . Osteopenia   . Persistent atrial fibrillation ([place])   . Stroke [facility]) 2013    Past Surgical History:  Procedure Laterality Date  . ANKLE SURGERY    . APPENDECTOMY    . CHOLECYSTECTOMY    . HERNIA REPAIR     Esophagus  . JOINT REPLACEMENT     total- right partial- left  . MASTECTOMY PARTIAL / LUMPECTOMY  2012   left    There were no vitals filed for this visit.  Subjective Assessment - 07/12/17 1134    Subjective  Patient reports that she was very tired after the treatment on Tuesday, she reports that she is really hurting and her left knee is giving out  a lot and she was "going to cancel", weather is cold and rainy                      [facility] Adult PT Treatment/Exercise - 07/12/17 0001      Ambulation/Gait   Gait Comments  HHA x 160 feet with the left knee buckling x 2.  Then HHA x 100 feet with rests and the left knee again buckling several times      Knee/Hip Exercises: Aerobic   Nustep  Level 4 x 8 minutes      Knee/Hip Exercises: Supine   Bridges  with Ball Squeeze  2 sets;15 reps    Other Supine Knee/Hip Exercises  red tband DF and eversion, 2# left hand punches and flexion, blue tband hip extension, 2# SAQ's and trunk rotations    Other Supine Knee/Hip Exercises  hip abduction with sliding board, truunk mobility               PT Short Term Goals - 06/05/17 1201      PT SHORT TERM GOAL #1   Title  independent with initial HEP    Status  Achieved        PT Long Term Goals - 07/12/17 1217      PT LONG TERM GOAL #1   Title  decrease timed up and go test to 25 seconds    Status  On-going      PT LONG TERM GOAL #2   Title  increased right LE strength to 4-/5    Status  On-going      PT LONG TERM GOAL #3   Title  walk 200 feet without rest using  FWW    Status  Partially Met            Plan - 07/12/17 1216    Clinical Impression Statement  Patient with the left knee buckling and giving way today, needed breaks while walking, had increased neglect of the left sice and some impulsive behaviors when transferring that were unsafe.  Reporting more pain due to weather    PT Next Visit Plan  She seems to hvae regressed some due to UTI, work to gegain PLOF       Patient will benefit from skilled therapeutic intervention in order to improve the following deficits and impairments:  Abnormal gait, Cardiopulmonary status limiting activity, Decreased activity tolerance, Decreased mobility, Decreased endurance, Decreased range of motion, Decreased strength, Difficulty walking, Impaired flexibility, Impaired sensation, Improper body mechanics, Pain, Impaired UE functional use  Visit Diagnosis: Muscle weakness (generalized)  Difficulty in walking, not elsewhere classified  Repeated falls  Late effects of CVA (cerebrovascular accident)  Hemiplegia and hemiparesis following cerebral infarction affecting left non-dominant side (HCC)  Ataxia, unspecified     Problem List Patient Active Problem List   Diagnosis Date  Noted  . UTI (urinary tract infection) 07/05/2017  . Nausea without vomiting 05/10/2017  . Hypoxemia   . Heart failure with preserved ejection fraction ([place]), Grade 3 diastolic dysfunction 95/74/7340  . Atrial fibrillation with RVR ([place])   . Chronic diastolic CHF (congestive heart failure) ([place])   . PAF (paroxysmal atrial fibrillation) ([place])   . History of stroke   . Dyspnea 03/19/2017  . Shortness of breath 03/19/2017  . Acute cystitis without hematuria   . Fever   . Metabolic syndrome 37/04/6437  . Encounter for preventive health examination 02/17/2016  . Morbid obesity ([place]) 06/17/2015  . Fall at home 12/01/2014  . Hypomagnesemia 04/24/2014  . Hemiparesis affecting left side as late effect of cerebrovascular accident ([place]) 04/24/2014  . Nontraumatic cerebral hemorrhage ([place]) 04/30/2012  . Diabetes type 2, controlled ([place]) 03/04/2010  . OSTEOPENIA 01/21/2009  . UNSPECIFIED VITAMIN D DEFICIENCY 11/19/2007  . ESSENTIAL HYPERTENSION, BENIGN 11/19/2007  . HYPERCHOLESTEROLEMIA 10/25/2006  . GASTROESOPHAGEAL REFLUX, NO ESOPHAGITIS 10/25/2006  . DIVERTICULOSIS OF COLON 10/25/2006  . Osteoarthritis 10/25/2006  . CERVICAL SPINE DISORDER, NOS 10/25/2006    Sumner Boast., PT 07/12/2017, 12:19 PM  [address] [address] [place], [place], [address] Phone: [phone]   Fax:  [phone]  Name: Karen Jackson MRN: [id number] Date of Birth: 10-27-1940

## 2017-07-17 ENCOUNTER — Ambulatory Visit: Payer: Medicare Other | Admitting: Physical Therapy

## 2017-07-17 ENCOUNTER — Encounter: Payer: Self-pay | Admitting: Physical Therapy

## 2017-07-17 DIAGNOSIS — I6990 Unspecified sequelae of unspecified cerebrovascular disease: Secondary | ICD-10-CM | POA: Diagnosis not present

## 2017-07-17 DIAGNOSIS — I69354 Hemiplegia and hemiparesis following cerebral infarction affecting left non-dominant side: Secondary | ICD-10-CM | POA: Diagnosis not present

## 2017-07-17 DIAGNOSIS — R262 Difficulty in walking, not elsewhere classified: Secondary | ICD-10-CM | POA: Diagnosis not present

## 2017-07-17 DIAGNOSIS — R296 Repeated falls: Secondary | ICD-10-CM

## 2017-07-17 DIAGNOSIS — M6281 Muscle weakness (generalized): Secondary | ICD-10-CM | POA: Diagnosis not present

## 2017-07-17 DIAGNOSIS — R270 Ataxia, unspecified: Secondary | ICD-10-CM

## 2017-07-17 NOTE — Therapy (Signed)
[name] [place], [place], [address] Phone: [phone]   Fax:  [phone]  Physical Therapy Treatment  Patient Details  Name: Karen Jackson MRN: [id number] Date of Birth: May 23, 1941 Referring Provider: Andria Frames   Encounter Date: 07/17/2017  PT End of Session - 07/17/17 1146    Visit Number  15    Date for PT Re-Evaluation  07/22/17    PT Start Time  1008    PT Stop Time  1055    PT Time Calculation (min)  47 min    Activity Tolerance  Patient limited by fatigue    Behavior During Therapy  Anxious;Impulsive       Past Medical History:  Diagnosis Date  . Acute cystitis without hematuria   . Acute diastolic CHF (congestive heart failure) ([place])   . Arthritis   . Dyspnea   . Fever of unknown origin 03/19/2017  . Hyperlipidemia   . Hypertension   . Multifocal pneumonia   . Osteopenia   . Persistent atrial fibrillation ([place])   . Stroke [facility]) 2013    Past Surgical History:  Procedure Laterality Date  . ANKLE SURGERY    . APPENDECTOMY    . CHOLECYSTECTOMY    . HERNIA REPAIR     Esophagus  . JOINT REPLACEMENT     total- right partial- left  . MASTECTOMY PARTIAL / LUMPECTOMY  2012   left    There were no vitals filed for this visit.  Subjective Assessment - 07/17/17 1143    Subjective  Patient continues to report fatigue and not walking well.  She reports no falls    Currently in Pain?  No/denies                      [facility] Adult PT Treatment/Exercise - 07/17/17 0001      Ambulation/Gait   Gait Comments  HHA x180 feet with left knee brace on and adjusted by me with 3 rests, then when leaving 180 feet out to car with 4 rest breaks, cues to pick up foot and relax arms      Knee/Hip Exercises: Standing   Other Standing Knee Exercises  worked on stepping up onto 4" step and a 6" step.  She became very frightened and pulled back the first attempt on the 6", then we changed and  she needed a lot of encouragement but was able to do      Knee/Hip Exercises: Seated   Other Seated Knee/Hip Exercises  3# LAQ, hip flexion, HS curls, red tband left arm exercises for triceps, biceps, retraction and presses               PT Short Term Goals - 06/05/17 1201      PT SHORT TERM GOAL #1   Title  independent with initial HEP    Status  Achieved        PT Long Term Goals - 07/12/17 1217      PT LONG TERM GOAL #1   Title  decrease timed up and go test to 25 seconds    Status  On-going      PT LONG TERM GOAL #2   Title  increased right LE strength to 4-/5    Status  On-going      PT LONG TERM GOAL #3   Title  walk 200 feet without rest using FWW    Status  Partially Met  Plan - 07/17/17 1146    Clinical Impression Statement  Patient really seems to have had a set back with the UTI, she reports that change in the weather is the issue, she is impulsive and putting a lot of torque on the left knee with transfers, needs cues to back all the way up and turn around fully before sitting,  she is really needing a lot of rests with the walking where she could just come in without rest.    PT Next Visit Plan  she continue to regress and today had a very difficult time with stepping up, she will have to go up a step at Thanksgiving    Consulted and Agree with Plan of Care  Patient       Patient will benefit from skilled therapeutic intervention in order to improve the following deficits and impairments:  Abnormal gait, Cardiopulmonary status limiting activity, Decreased activity tolerance, Decreased mobility, Decreased endurance, Decreased range of motion, Decreased strength, Difficulty walking, Impaired flexibility, Impaired sensation, Improper body mechanics, Pain, Impaired UE functional use  Visit Diagnosis: Muscle weakness (generalized)  Difficulty in walking, not elsewhere classified  Repeated falls  Late effects of CVA (cerebrovascular  accident)  Hemiplegia and hemiparesis following cerebral infarction affecting left non-dominant side (HCC)  Ataxia, unspecified     Problem List Patient Active Problem List   Diagnosis Date Noted  . UTI (urinary tract infection) 07/05/2017  . Nausea without vomiting 05/10/2017  . Hypoxemia   . Heart failure with preserved ejection fraction ([place]), Grade 3 diastolic dysfunction 63/72/9426  . Atrial fibrillation with RVR ([place])   . Chronic diastolic CHF (congestive heart failure) ([place])   . PAF (paroxysmal atrial fibrillation) ([place])   . History of stroke   . Dyspnea 03/19/2017  . Shortness of breath 03/19/2017  . Acute cystitis without hematuria   . Fever   . Metabolic syndrome 27/00/4849  . Encounter for preventive health examination 02/17/2016  . Morbid obesity ([place]) 06/17/2015  . Fall at home 12/01/2014  . Hypomagnesemia 04/24/2014  . Hemiparesis affecting left side as late effect of cerebrovascular accident ([place]) 04/24/2014  . Nontraumatic cerebral hemorrhage ([place]) 04/30/2012  . Diabetes type 2, controlled ([place]) 03/04/2010  . OSTEOPENIA 01/21/2009  . UNSPECIFIED VITAMIN D DEFICIENCY 11/19/2007  . ESSENTIAL HYPERTENSION, BENIGN 11/19/2007  . HYPERCHOLESTEROLEMIA 10/25/2006  . GASTROESOPHAGEAL REFLUX, NO ESOPHAGITIS 10/25/2006  . DIVERTICULOSIS OF COLON 10/25/2006  . Osteoarthritis 10/25/2006  . CERVICAL SPINE DISORDER, NOS 10/25/2006    Sumner Boast., PT 07/17/2017, 11:52 AM  [name] [place], [place], [address] Phone: [phone]   Fax:  [phone]  Name: Karen Jackson MRN: [id number] Date of Birth: Jan 02, 1941

## 2017-07-24 ENCOUNTER — Ambulatory Visit: Payer: Medicare Other | Admitting: Physical Therapy

## 2017-07-24 ENCOUNTER — Encounter: Payer: Self-pay | Admitting: Physical Therapy

## 2017-07-24 DIAGNOSIS — R270 Ataxia, unspecified: Secondary | ICD-10-CM

## 2017-07-24 DIAGNOSIS — R262 Difficulty in walking, not elsewhere classified: Secondary | ICD-10-CM | POA: Diagnosis not present

## 2017-07-24 DIAGNOSIS — I6990 Unspecified sequelae of unspecified cerebrovascular disease: Secondary | ICD-10-CM | POA: Diagnosis not present

## 2017-07-24 DIAGNOSIS — I69354 Hemiplegia and hemiparesis following cerebral infarction affecting left non-dominant side: Secondary | ICD-10-CM | POA: Diagnosis not present

## 2017-07-24 DIAGNOSIS — R296 Repeated falls: Secondary | ICD-10-CM

## 2017-07-24 DIAGNOSIS — M6281 Muscle weakness (generalized): Secondary | ICD-10-CM

## 2017-07-24 NOTE — Therapy (Signed)
[address] [place], [place], [address] Phone: [phone]   Fax:  [phone]  Physical Therapy Treatment  Patient Details  Name: Karen Jackson MRN: [id number] Date of Birth: Feb 07, 1941 Referring Provider: Andria Frames   Encounter Date: 07/24/2017  PT End of Session - 07/24/17 1155    Visit Number  16    Date for PT Re-Evaluation  08/21/17    PT Start Time  1100    PT Stop Time  1150    PT Time Calculation (min)  50 min    Activity Tolerance  Patient limited by fatigue    Behavior During Therapy  Anxious;Impulsive       Past Medical History:  Diagnosis Date  . Acute cystitis without hematuria   . Acute diastolic CHF (congestive heart failure) ([place])   . Arthritis   . Dyspnea   . Fever of unknown origin 03/19/2017  . Hyperlipidemia   . Hypertension   . Multifocal pneumonia   . Osteopenia   . Persistent atrial fibrillation ([place])   . Stroke [facility]) 2013    Past Surgical History:  Procedure Laterality Date  . ANKLE SURGERY    . APPENDECTOMY    . CHOLECYSTECTOMY    . HERNIA REPAIR     Esophagus  . JOINT REPLACEMENT     total- right partial- left  . MASTECTOMY PARTIAL / LUMPECTOMY  2012   left    There were no vitals filed for this visit.  Subjective Assessment - 07/24/17 1152    Subjective  Patient reports that she needed a lot of help with walking and getting into a home at Thanksgiving, no falls, but reports just difficulty    Currently in Pain?  No/denies                      [facility] Adult PT Treatment/Exercise - 07/24/17 0001      Ambulation/Gait   Gait Comments  HHA x 180 feet with one rest into building, then 180 feet with 3 rest breaks on way out, some c/o left knee giving      Knee/Hip Exercises: Aerobic   Nustep  Level 4 x 8 minutes    Other Aerobic  UBE level 4 x 4 minutes, again cues to keep moving      Knee/Hip Exercises: Seated   Other Seated Knee/Hip Exercises   red tband shoulder exercises      Knee/Hip Exercises: Supine   Bridges with Ball Squeeze  2 sets;15 reps    Straight Leg Raises  2 sets;10 reps    Other Supine Knee/Hip Exercises  hip abduction with sliding board, truunk mobility, hip extnesion with black tband, green tband hip abduction in bridge posistion, SAQ 5# with cues to focus on slow and end range, feet on ball bridges               PT Short Term Goals - 06/05/17 1201      PT SHORT TERM GOAL #1   Title  independent with initial HEP    Status  Achieved        PT Long Term Goals - 07/24/17 1255      PT LONG TERM GOAL #1   Title  decrease timed up and go test to 25 seconds    Status  On-going      PT LONG TERM GOAL #2   Title  increased right LE strength to 4-/5  Status  Partially Met      PT LONG TERM GOAL #3   Title  walk 200 feet without rest using FWW    Status  Partially Met      PT LONG TERM GOAL #4   Title  no falls over a 6 week period    Status  Achieved      PT LONG TERM GOAL #5   Title  report no difficulty pulling her pants up when going to the bathroom    Status  Partially Met            Plan - 07/24/17 1156    Clinical Impression Statement  Patient has continued some regression in her function.  When I think of PLOF for Romelle about a year ago she was able to walk on tmill x 6 minutes, was able to walk into building without rest and out without difficulty.  She has regressed this year due to UTI and pneu    PT Next Visit Plan  Continue to work on Patent attorney and Agree with Plan of Care  Patient       Patient will benefit from skilled therapeutic intervention in order to improve the following deficits and impairments:  Abnormal gait, Cardiopulmonary status limiting activity, Decreased activity tolerance, Decreased mobility, Decreased endurance, Decreased range of motion, Decreased strength, Difficulty walking, Impaired flexibility, Impaired sensation, Improper body mechanics,  Pain, Impaired UE functional use  Visit Diagnosis: Muscle weakness (generalized) - Plan: PT plan of care cert/re-cert  Difficulty in walking, not elsewhere classified - Plan: PT plan of care cert/re-cert  Repeated falls - Plan: PT plan of care cert/re-cert  Late effects of CVA (cerebrovascular accident) - Plan: PT plan of care cert/re-cert  Hemiplegia and hemiparesis following cerebral infarction affecting left non-dominant side ([place]) - Plan: PT plan of care cert/re-cert  Ataxia, unspecified - Plan: PT plan of care cert/re-cert     Problem List Patient Active Problem List   Diagnosis Date Noted  . UTI (urinary tract infection) 07/05/2017  . Nausea without vomiting 05/10/2017  . Hypoxemia   . Heart failure with preserved ejection fraction ([place]), Grade 3 diastolic dysfunction 35/70/1779  . Atrial fibrillation with RVR ([place])   . Chronic diastolic CHF (congestive heart failure) ([place])   . PAF (paroxysmal atrial fibrillation) ([place])   . History of stroke   . Dyspnea 03/19/2017  . Shortness of breath 03/19/2017  . Acute cystitis without hematuria   . Fever   . Metabolic syndrome 39/10/90  . Encounter for preventive health examination 02/17/2016  . Morbid obesity ([place]) 06/17/2015  . Fall at home 12/01/2014  . Hypomagnesemia 04/24/2014  . Hemiparesis affecting left side as late effect of cerebrovascular accident ([place]) 04/24/2014  . Nontraumatic cerebral hemorrhage ([place]) 04/30/2012  . Diabetes type 2, controlled ([place]) 03/04/2010  . OSTEOPENIA 01/21/2009  . UNSPECIFIED VITAMIN D DEFICIENCY 11/19/2007  . ESSENTIAL HYPERTENSION, BENIGN 11/19/2007  . HYPERCHOLESTEROLEMIA 10/25/2006  . GASTROESOPHAGEAL REFLUX, NO ESOPHAGITIS 10/25/2006  . DIVERTICULOSIS OF COLON 10/25/2006  . Osteoarthritis 10/25/2006  . CERVICAL SPINE DISORDER, NOS 10/25/2006    Sumner Boast., PT 07/24/2017, 12:57 PM  [place] [address] [place], [place], [address] Phone: [phone]   Fax:  [phone]  Name: Karen Jackson MRN: [id number] Date of Birth: 1940-12-16

## 2017-07-31 ENCOUNTER — Ambulatory Visit: Payer: Medicare Other | Attending: Family Medicine | Admitting: Physical Therapy

## 2017-07-31 ENCOUNTER — Encounter: Payer: Self-pay | Admitting: Physical Therapy

## 2017-07-31 DIAGNOSIS — R262 Difficulty in walking, not elsewhere classified: Secondary | ICD-10-CM | POA: Insufficient documentation

## 2017-07-31 DIAGNOSIS — I69354 Hemiplegia and hemiparesis following cerebral infarction affecting left non-dominant side: Secondary | ICD-10-CM | POA: Insufficient documentation

## 2017-07-31 DIAGNOSIS — M6281 Muscle weakness (generalized): Secondary | ICD-10-CM | POA: Insufficient documentation

## 2017-07-31 DIAGNOSIS — R270 Ataxia, unspecified: Secondary | ICD-10-CM

## 2017-07-31 DIAGNOSIS — I6990 Unspecified sequelae of unspecified cerebrovascular disease: Secondary | ICD-10-CM | POA: Diagnosis not present

## 2017-07-31 DIAGNOSIS — R296 Repeated falls: Secondary | ICD-10-CM | POA: Insufficient documentation

## 2017-07-31 NOTE — Therapy (Signed)
[address] [place], [place], [address] Phone: [phone]   Fax:  [phone]  Physical Therapy Treatment  Patient Details  Name: Karen Jackson MRN: [id number] Date of Birth: [birth year] Referring Provider: Andria Frames   Encounter Date: 07/31/2017  PT End of Session - 07/31/17 1030    Visit Number  17    Date for PT Re-Evaluation  08/21/17    PT Start Time  0935    PT Stop Time  1025    PT Time Calculation (min)  50 min    Activity Tolerance  Patient tolerated treatment well    Behavior During Therapy  [facility] for tasks assessed/performed       Past Medical History:  Diagnosis Date  . Acute cystitis without hematuria   . Acute diastolic CHF (congestive heart failure) ([place])   . Arthritis   . Dyspnea   . Fever of unknown origin 03/19/2017  . Hyperlipidemia   . Hypertension   . Multifocal pneumonia   . Osteopenia   . Persistent atrial fibrillation ([place])   . Stroke [facility]) 2013    Past Surgical History:  Procedure Laterality Date  . ANKLE SURGERY    . APPENDECTOMY    . CHOLECYSTECTOMY    . HERNIA REPAIR     Esophagus  . JOINT REPLACEMENT     total- right partial- left  . MASTECTOMY PARTIAL / LUMPECTOMY  2012   left    There were no vitals filed for this visit.  Subjective Assessment - 07/31/17 0946    Subjective  Feeling a little better, my joints are still really hurting    Currently in Pain?  No/denies                      [facility] Adult PT Treatment/Exercise - 07/31/17 0001      Transfers   Comments  worked on bed rolling and mobility      Ambulation/Gait   Gait Comments  200 feet x 1 and 180 feet x 1 , one rest break with each of these, some cues for posture      Knee/Hip Exercises: Aerobic   Nustep  Level 4 x 8 minutes    Other Aerobic  UBE level 4 x 4 minutes, again cues to keep moving      Knee/Hip Exercises: Supine   Bridges with [employer]  2 sets;15 reps    Straight Leg Raises  2 sets;10 reps    Other Supine Knee/Hip Exercises  7.5 # SAQ with cues to go slow and squeeze, TKE with green tband, hip abductio with sliding board, 2# left arm punches, green tband hip extension    Other Supine Knee/Hip Exercises  feet on ball K2C, trunk rotation, bridges, isomettric abdominals, ball squeeze with bridges      Knee/Hip Exercises: Sidelying   Clams  2x10               PT Short Term Goals - 06/05/17 1201      PT SHORT TERM GOAL #1   Title  independent with initial HEP    Status  Achieved        PT Long Term Goals - 07/24/17 1255      PT LONG TERM GOAL #1   Title  decrease timed up and go test to 25 seconds    Status  On-going      PT LONG TERM GOAL #2   Title  increased right LE strength to 4-/5    Status  Partially Met      PT LONG TERM GOAL #3   Title  walk 200 feet without rest using FWW    Status  Partially Met      PT LONG TERM GOAL #4   Title  no falls over a 6 week period    Status  Achieved      PT LONG TERM GOAL #5   Title  report no difficulty pulling her pants up when going to the bathroom    Status  Partially Met            Plan - 07/31/17 1030    Clinical Impression Statement  Patient safer today with her transfers, less impulsive, she had one instance where the left knee went into valgus really bad and almost gave way.  I feel that she again has neglect and needs to go back to some basic exercises to regain control of the left extremities especially the left leg, as if it fails on her it will significantly impact her independence and quality of life    PT Next Visit Plan  go back to some basics to regain control of the left extremities    Consulted and Agree with Plan of Care  Patient       Patient will benefit from skilled therapeutic intervention in order to improve the following deficits and impairments:  Abnormal gait, Cardiopulmonary status limiting activity, Decreased activity tolerance, Decreased  mobility, Decreased endurance, Decreased range of motion, Decreased strength, Difficulty walking, Impaired flexibility, Impaired sensation, Improper body mechanics, Pain, Impaired UE functional use  Visit Diagnosis: Muscle weakness (generalized)  Difficulty in walking, not elsewhere classified  Repeated falls  Late effects of CVA (cerebrovascular accident)  Hemiplegia and hemiparesis following cerebral infarction affecting left non-dominant side (HCC)  Ataxia, unspecified     Problem List Patient Active Problem List   Diagnosis Date Noted  . UTI (urinary tract infection) 07/05/2017  . Nausea without vomiting 05/10/2017  . Hypoxemia   . Heart failure with preserved ejection fraction ([place]), Grade 3 diastolic dysfunction 22/24/1146  . Atrial fibrillation with RVR ([place])   . Chronic diastolic CHF (congestive heart failure) ([place])   . PAF (paroxysmal atrial fibrillation) ([place])   . History of stroke   . Dyspnea 03/19/2017  . Shortness of breath 03/19/2017  . Acute cystitis without hematuria   . Fever   . Metabolic syndrome 43/14/2767  . Encounter for preventive health examination 02/17/2016  . Morbid obesity ([place]) 06/17/2015  . Fall at home 12/01/2014  . Hypomagnesemia 04/24/2014  . Hemiparesis affecting left side as late effect of cerebrovascular accident ([place]) 04/24/2014  . Nontraumatic cerebral hemorrhage ([place]) 04/30/2012  . Diabetes type 2, controlled ([place]) 03/04/2010  . OSTEOPENIA 01/21/2009  . UNSPECIFIED VITAMIN D DEFICIENCY 11/19/2007  . ESSENTIAL HYPERTENSION, BENIGN 11/19/2007  . HYPERCHOLESTEROLEMIA 10/25/2006  . GASTROESOPHAGEAL REFLUX, NO ESOPHAGITIS 10/25/2006  . DIVERTICULOSIS OF COLON 10/25/2006  . Osteoarthritis 10/25/2006  . CERVICAL SPINE DISORDER, NOS 10/25/2006    Sumner Boast., PT 07/31/2017, 10:33 AM  [place] [address] [place], [place], [address] Phone: [phone]   Fax:   [phone]  Name: Karen Jackson MRN: [id number] Date of Birth: [birth year]

## 2017-08-01 ENCOUNTER — Other Ambulatory Visit: Payer: Self-pay | Admitting: Family Medicine

## 2017-08-02 ENCOUNTER — Ambulatory Visit: Payer: Medicare Other | Admitting: Physical Therapy

## 2017-08-02 ENCOUNTER — Encounter: Payer: Self-pay | Admitting: Physical Therapy

## 2017-08-02 DIAGNOSIS — I69354 Hemiplegia and hemiparesis following cerebral infarction affecting left non-dominant side: Secondary | ICD-10-CM

## 2017-08-02 DIAGNOSIS — M6281 Muscle weakness (generalized): Secondary | ICD-10-CM

## 2017-08-02 DIAGNOSIS — R270 Ataxia, unspecified: Secondary | ICD-10-CM

## 2017-08-02 DIAGNOSIS — I6990 Unspecified sequelae of unspecified cerebrovascular disease: Secondary | ICD-10-CM | POA: Diagnosis not present

## 2017-08-02 DIAGNOSIS — R262 Difficulty in walking, not elsewhere classified: Secondary | ICD-10-CM | POA: Diagnosis not present

## 2017-08-02 DIAGNOSIS — R296 Repeated falls: Secondary | ICD-10-CM

## 2017-08-02 NOTE — Therapy (Signed)
[address] [place], [place], [address] Phone: [phone]   Fax:  [phone]  Physical Therapy Treatment  Patient Details  Name: Karen Jackson MRN: [id number] Date of Birth: 04-17-1941 Referring Provider: Andria Frames   Encounter Date: 08/02/2017  PT End of Session - 08/02/17 1257    Visit Number  18    Date for PT Re-Evaluation  08/21/17    PT Start Time  1057    PT Stop Time  1145    PT Time Calculation (min)  48 min    Activity Tolerance  Patient tolerated treatment well    Behavior During Therapy  [facility] for tasks assessed/performed       Past Medical History:  Diagnosis Date  . Acute cystitis without hematuria   . Acute diastolic CHF (congestive heart failure) ([place])   . Arthritis   . Dyspnea   . Fever of unknown origin 03/19/2017  . Hyperlipidemia   . Hypertension   . Multifocal pneumonia   . Osteopenia   . Persistent atrial fibrillation ([place])   . Stroke [facility]) 2013    Past Surgical History:  Procedure Laterality Date  . ANKLE SURGERY    . APPENDECTOMY    . CHOLECYSTECTOMY    . HERNIA REPAIR     Esophagus  . JOINT REPLACEMENT     total- right partial- left  . MASTECTOMY PARTIAL / LUMPECTOMY  2012   left    There were no vitals filed for this visit.  Subjective Assessment - 08/02/17 1108    Subjective  Reports that she felt better after the last treatment, she does report catching the left toe some today with walking    Currently in Pain?  Yes    Pain Score  4     Pain Location  Shoulder    Pain Orientation  Left    Pain Descriptors / Indicators  Sore;Tingling    Pain Type  Acute pain    Aggravating Factors   seems to have a trigger point in the left upper trap         [facility] PT Assessment - 08/02/17 0001      Timed Up and Go Test   Normal TUG (seconds)  40                  [facility] Adult PT Treatment/Exercise - 08/02/17 0001      Ambulation/Gait   Gait Comments   200 feet x 1 and 180 feet x 1 , one rest break with each of these, some cues for posture, left toe caught numerous times on the carpet when walking in the hall      Knee/Hip Exercises: Aerobic   Nustep  Level 5, asked her to try to do 600 steps in 10 minutes, she needed cues to keep on pace,       Knee/Hip Exercises: Seated   Other Seated Knee/Hip Exercises  red tband ankle DF      Knee/Hip Exercises: Supine   Bridges with Diona Foley Squeeze  2 sets;15 reps    Straight Leg Raises  2 sets;10 reps    Other Supine Knee/Hip Exercises  10 # SAQ with cues to go slow and squeeze, TKE with green tband, hip abductio with sliding board, 2# left arm punches, green tband hip extension, left hip IR    Other Supine Knee/Hip Exercises  feet on ball K2C, trunk rotation, bridges, isomettric abdominals, ball squeeze with bridges  Knee/Hip Exercises: Sidelying   Clams  2x10               PT Short Term Goals - 06/05/17 1201      PT SHORT TERM GOAL #1   Title  independent with initial HEP    Status  Achieved        PT Long Term Goals - 08/02/17 1308      PT LONG TERM GOAL #1   Title  decrease timed up and go test to 25 seconds    Status  Partially Met            Plan - 08/02/17 1257    Clinical Impression Statement  Patient reported that she was feeling better with the leg, she did have trouble keeping a pace on the Nustep, started talking to her about the problem with her neglect of the left side and how she reverts back when she is not thinking ot the left side and that she needs to challenge herself or she tends to go easy    PT Next Visit Plan  go back to some basics to regain control of the left extremities    Consulted and Agree with Plan of Care  Patient       Patient will benefit from skilled therapeutic intervention in order to improve the following deficits and impairments:  Abnormal gait, Cardiopulmonary status limiting activity, Decreased activity tolerance, Decreased  mobility, Decreased endurance, Decreased range of motion, Decreased strength, Difficulty walking, Impaired flexibility, Impaired sensation, Improper body mechanics, Pain, Impaired UE functional use  Visit Diagnosis: Muscle weakness (generalized)  Difficulty in walking, not elsewhere classified  Repeated falls  Late effects of CVA (cerebrovascular accident)  Hemiplegia and hemiparesis following cerebral infarction affecting left non-dominant side (HCC)  Ataxia, unspecified     Problem List Patient Active Problem List   Diagnosis Date Noted  . UTI (urinary tract infection) 07/05/2017  . Nausea without vomiting 05/10/2017  . Hypoxemia   . Heart failure with preserved ejection fraction ([place]), Grade 3 diastolic dysfunction 30/02/6225  . Atrial fibrillation with RVR ([place])   . Chronic diastolic CHF (congestive heart failure) ([place])   . PAF (paroxysmal atrial fibrillation) ([place])   . History of stroke   . Dyspnea 03/19/2017  . Shortness of breath 03/19/2017  . Acute cystitis without hematuria   . Fever   . Metabolic syndrome 33/35/4562  . Encounter for preventive health examination 02/17/2016  . Morbid obesity ([place]) 06/17/2015  . Fall at home 12/01/2014  . Hypomagnesemia 04/24/2014  . Hemiparesis affecting left side as late effect of cerebrovascular accident ([place]) 04/24/2014  . Nontraumatic cerebral hemorrhage ([place]) 04/30/2012  . Diabetes type 2, controlled ([place]) 03/04/2010  . OSTEOPENIA 01/21/2009  . UNSPECIFIED VITAMIN D DEFICIENCY 11/19/2007  . ESSENTIAL HYPERTENSION, BENIGN 11/19/2007  . HYPERCHOLESTEROLEMIA 10/25/2006  . GASTROESOPHAGEAL REFLUX, NO ESOPHAGITIS 10/25/2006  . DIVERTICULOSIS OF COLON 10/25/2006  . Osteoarthritis 10/25/2006  . CERVICAL SPINE DISORDER, NOS 10/25/2006    Sumner Boast., PT 08/02/2017, 1:09 PM  [place] [address] [place], [place], [address] Phone: [phone]   Fax:   [phone]  Name: Karen Jackson MRN: [id number] Date of Birth: [birth year]

## 2017-08-07 ENCOUNTER — Ambulatory Visit: Payer: Medicare Other | Admitting: Physical Therapy

## 2017-08-09 ENCOUNTER — Ambulatory Visit: Payer: Medicare Other | Admitting: Physical Therapy

## 2017-08-09 ENCOUNTER — Telehealth: Payer: Self-pay | Admitting: *Deleted

## 2017-08-09 DIAGNOSIS — Z20828 Contact with and (suspected) exposure to other viral communicable diseases: Secondary | ICD-10-CM | POA: Insufficient documentation

## 2017-08-09 DIAGNOSIS — N390 Urinary tract infection, site not specified: Secondary | ICD-10-CM

## 2017-08-09 MED ORDER — OSELTAMIVIR PHOSPHATE 75 MG PO CAPS: 75.0000 mg | ORAL_CAPSULE | Freq: Every day | ORAL | 0 refills | Status: DC

## 2017-08-09 MED ORDER — CEPHALEXIN 500 MG PO CAPS: 500.0000 mg | ORAL_CAPSULE | Freq: Two times a day (BID) | ORAL | 0 refills | Status: AC

## 2017-08-09 NOTE — Assessment & Plan Note (Signed)
Caregiver who spent the night has been diagnosed with influenza.  (Unclear how firm that dx is.)  Patient has had flu shot.  Flu is in the community and has occurred in patients who have received vaccination.  Will rx with tamiflu

## 2017-08-09 NOTE — Telephone Encounter (Addendum)
Patient called in asking for med for UTI symptoms. Reports pelvic pressure, "dribbling" every 45 minutes and urgency. Denies fever, N/V, flank pain. No burning or itching with urination. States she saved one pill from antibiotic given in Nov for emergencies and took it this AM. Has been drinking lots of water. Uses [facility] on [address]. Hubbard Hartshorn, RN, BSN

## 2017-08-09 NOTE — Telephone Encounter (Signed)
Pts caregiver whom has been at her house for 3 days was dx with the flue today.  Pt is asymptomatic but wonders if Dr. Andria Frames will give her tamiflu. , Salome Spotted, CMA

## 2017-08-09 NOTE — Assessment & Plan Note (Signed)
Sx classic and has hx of utis.  No fever, nausea or vomiting:i.e. Nothing suggestive of pyelo.  Will Rx empirically.

## 2017-08-14 ENCOUNTER — Encounter: Payer: Self-pay | Admitting: Physical Therapy

## 2017-08-14 ENCOUNTER — Ambulatory Visit: Payer: Medicare Other | Admitting: Physical Therapy

## 2017-08-14 DIAGNOSIS — I6990 Unspecified sequelae of unspecified cerebrovascular disease: Secondary | ICD-10-CM | POA: Diagnosis not present

## 2017-08-14 DIAGNOSIS — M6281 Muscle weakness (generalized): Secondary | ICD-10-CM | POA: Diagnosis not present

## 2017-08-14 DIAGNOSIS — I69354 Hemiplegia and hemiparesis following cerebral infarction affecting left non-dominant side: Secondary | ICD-10-CM | POA: Diagnosis not present

## 2017-08-14 DIAGNOSIS — R296 Repeated falls: Secondary | ICD-10-CM | POA: Diagnosis not present

## 2017-08-14 DIAGNOSIS — R262 Difficulty in walking, not elsewhere classified: Secondary | ICD-10-CM

## 2017-08-14 DIAGNOSIS — R270 Ataxia, unspecified: Secondary | ICD-10-CM | POA: Diagnosis not present

## 2017-08-14 NOTE — Therapy (Signed)
[address] [address] [place], [place], [address] Phone: [phone]   Fax:  [phone]  Physical Therapy Treatment  Patient Details  Name: Karen Jackson MRN: [id number] Date of Birth: June 25, 1941 Referring Provider: Andria Frames   Encounter Date: 08/14/2017  PT End of Session - 08/14/17 1400    Visit Number  19    Date for PT Re-Evaluation  08/21/17    PT Start Time  1050    PT Stop Time  1140    PT Time Calculation (min)  50 min    Activity Tolerance  Patient tolerated treatment well    Behavior During Therapy  [facility] for tasks assessed/performed       Past Medical History:  Diagnosis Date  . Acute cystitis without hematuria   . Acute diastolic CHF (congestive heart failure) ([place])   . Arthritis   . Dyspnea   . Fever of unknown origin 03/19/2017  . Hyperlipidemia   . Hypertension   . Multifocal pneumonia   . Osteopenia   . Persistent atrial fibrillation ([place])   . Stroke [facility]) 2013    Past Surgical History:  Procedure Laterality Date  . ANKLE SURGERY    . APPENDECTOMY    . CHOLECYSTECTOMY    . HERNIA REPAIR     Esophagus  . JOINT REPLACEMENT     total- right partial- left  . MASTECTOMY PARTIAL / LUMPECTOMY  2012   left    There were no vitals filed for this visit.  Subjective Assessment - 08/14/17 1311    Subjective  Reports that she has not been in due to snow, reports that she tried to exercise at home but she really feels like she cannot do much and feels weak    Currently in Pain?  No/denies                      [facility] Adult PT Treatment/Exercise - 08/14/17 0001      Transfers   Comments  worked on bed rolling and mobility      Ambulation/Gait   Gait Comments  200 feet x 1 and 180 feet x 1 , one rest break with each of these, some cues for posture, left toe caught numerous times on the carpet when walking in the hall      High Level Balance   High Level Balance Activities   Side stepping;Backward walking      Knee/Hip Exercises: Aerobic   Nustep  Level 5, asked her to try to do 600 steps in 10 minutes, she needed cues to keep on pace,       Knee/Hip Exercises: Seated   Other Seated Knee/Hip Exercises  red tband ankle DF, and shoulder exercises      Knee/Hip Exercises: Supine   Bridges with Ball Squeeze  2 sets;15 reps    Straight Leg Raises  2 sets;10 reps    Other Supine Knee/Hip Exercises  10 # SAQ with cues to go slow and squeeze, TKE with green tband, hip abductio with sliding board, 2# left arm punches, green tband hip extension, left hip IR    Other Supine Knee/Hip Exercises  feet on ball K2C, trunk rotation, bridges, isomettric abdominals, ball squeeze with bridges      Knee/Hip Exercises: Sidelying   Clams  2x10               PT Short Term Goals - 06/05/17 1201  PT SHORT TERM GOAL #1   Title  independent with initial HEP    Status  Achieved        PT Long Term Goals - 08/02/17 1308      PT LONG TERM GOAL #1   Title  decrease timed up and go test to 25 seconds    Status  Partially Met            Plan - 08/14/17 1400    Clinical Impression Statement  Patient has had some ups and downs depending on her ability to come to PT, cue to the snow she was not in last week.  She reports that she tried to do exercises but reports that she really feels weak and is having trouble walking.    PT Next Visit Plan  continue to work on her function    Consulted and Agree with Plan of Care  Patient       Patient will benefit from skilled therapeutic intervention in order to improve the following deficits and impairments:  Abnormal gait, Cardiopulmonary status limiting activity, Decreased activity tolerance, Decreased mobility, Decreased endurance, Decreased range of motion, Decreased strength, Difficulty walking, Impaired flexibility, Impaired sensation, Improper body mechanics, Pain, Impaired UE functional use  Visit Diagnosis: Muscle  weakness (generalized)  Difficulty in walking, not elsewhere classified  Repeated falls     Problem List Patient Active Problem List   Diagnosis Date Noted  . Exposure to influenza 08/09/2017  . UTI (urinary tract infection) 07/05/2017  . Nausea without vomiting 05/10/2017  . Hypoxemia   . Heart failure with preserved ejection fraction ([place]), Grade 3 diastolic dysfunction 46/95/0722  . Atrial fibrillation with RVR ([place])   . Chronic diastolic CHF (congestive heart failure) ([place])   . PAF (paroxysmal atrial fibrillation) ([place])   . History of stroke   . Dyspnea 03/19/2017  . Shortness of breath 03/19/2017  . Acute cystitis without hematuria   . Fever   . Metabolic syndrome 57/50/5183  . Encounter for preventive health examination 02/17/2016  . Morbid obesity ([place]) 06/17/2015  . Fall at home 12/01/2014  . Hypomagnesemia 04/24/2014  . Hemiparesis affecting left side as late effect of cerebrovascular accident ([place]) 04/24/2014  . Nontraumatic cerebral hemorrhage ([place]) 04/30/2012  . Diabetes type 2, controlled ([place]) 03/04/2010  . OSTEOPENIA 01/21/2009  . UNSPECIFIED VITAMIN D DEFICIENCY 11/19/2007  . ESSENTIAL HYPERTENSION, BENIGN 11/19/2007  . HYPERCHOLESTEROLEMIA 10/25/2006  . GASTROESOPHAGEAL REFLUX, NO ESOPHAGITIS 10/25/2006  . DIVERTICULOSIS OF COLON 10/25/2006  . Osteoarthritis 10/25/2006  . CERVICAL SPINE DISORDER, NOS 10/25/2006    Sumner Boast., PT 08/14/2017, 2:22 PM  [place] [address] [place], [place], [address] Phone: [phone]   Fax:  [phone]  Name: Karen Jackson MRN: [id number] Date of Birth: 1941-08-02

## 2017-08-16 ENCOUNTER — Encounter: Payer: Self-pay | Admitting: Physical Therapy

## 2017-08-16 ENCOUNTER — Ambulatory Visit: Payer: Medicare Other | Admitting: Physical Therapy

## 2017-08-16 DIAGNOSIS — I69354 Hemiplegia and hemiparesis following cerebral infarction affecting left non-dominant side: Secondary | ICD-10-CM

## 2017-08-16 DIAGNOSIS — I6990 Unspecified sequelae of unspecified cerebrovascular disease: Secondary | ICD-10-CM | POA: Diagnosis not present

## 2017-08-16 DIAGNOSIS — R262 Difficulty in walking, not elsewhere classified: Secondary | ICD-10-CM | POA: Diagnosis not present

## 2017-08-16 DIAGNOSIS — R270 Ataxia, unspecified: Secondary | ICD-10-CM

## 2017-08-16 DIAGNOSIS — R296 Repeated falls: Secondary | ICD-10-CM

## 2017-08-16 DIAGNOSIS — M6281 Muscle weakness (generalized): Secondary | ICD-10-CM | POA: Diagnosis not present

## 2017-08-16 IMAGING — MR MR HEAD W/O CM
10 of 11 series · 33 of 48 positions shown · non-contrast
Comparison: None.

CLINICAL DATA: Dyspnea. Code sepsis. Personal history of stroke
with left-sided deficits.

EXAM:
MRI HEAD WITHOUT CONTRAST
TECHNIQUE: Multiplanar, multiecho pulse sequences of the brain and surrounding
structures were obtained without intravenous contrast.

[Series 3: DWI · axial · 3.0mm · 0.94mm/px · z∈[-55,+82]mm · 8 of 94 slices shown (1 of 2)]
[im 1/94]
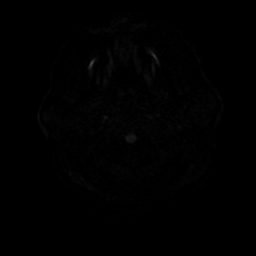
[im 11/94]
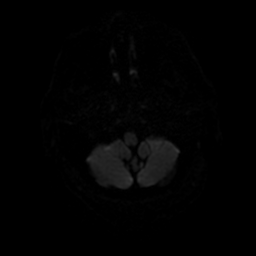
[im 32/94]
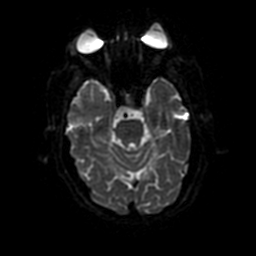
[im 42/94]
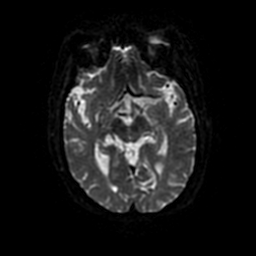
[im 52/94]
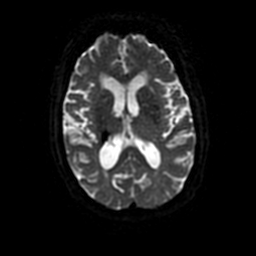
[im 63/94]
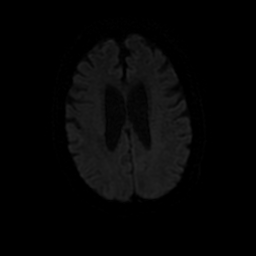
[im 83/94]
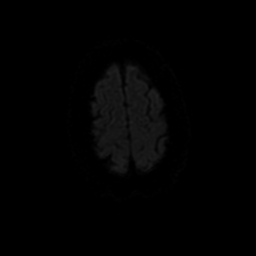
[im 94/94]
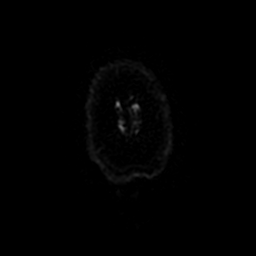

[Series 4: DWI · coronal · 4.0mm · 0.94mm/px · 6 of 66 slices shown (2 of 2)]
[im 1/66]
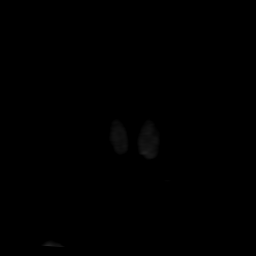
[im 14/66]
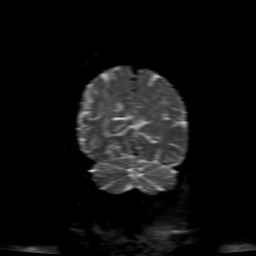
[im 27/66]
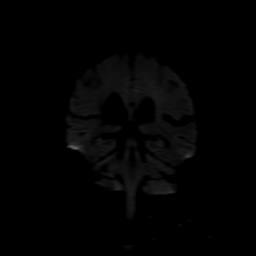
[im 40/66]
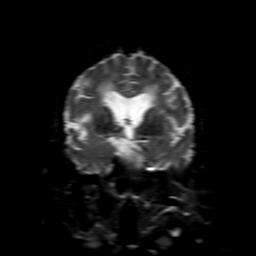
[im 53/66]
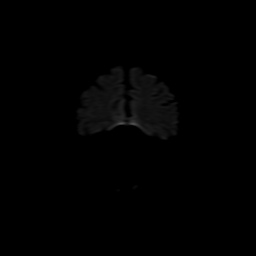
[im 66/66]
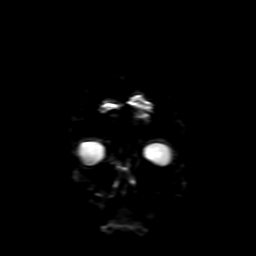

[Series 5: FLAIR · sagittal · 5.0mm · 0.47mm/px · 2 of 22 slices shown (1 of 3)]
[im 1/22]
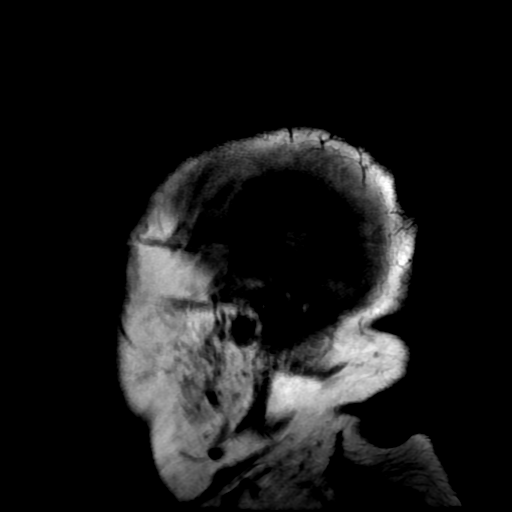
[im 22/22]
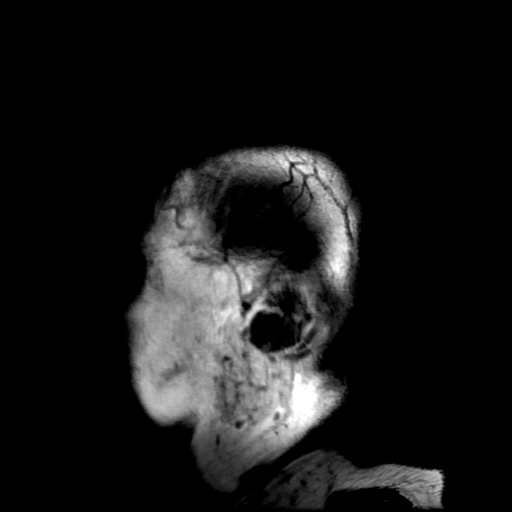

[Series 6: T2 · axial · 5.0mm · 0.47mm/px · z∈[-69,+75]mm · 2 of 25 slices shown (1 of 2)]
[im 1/25]
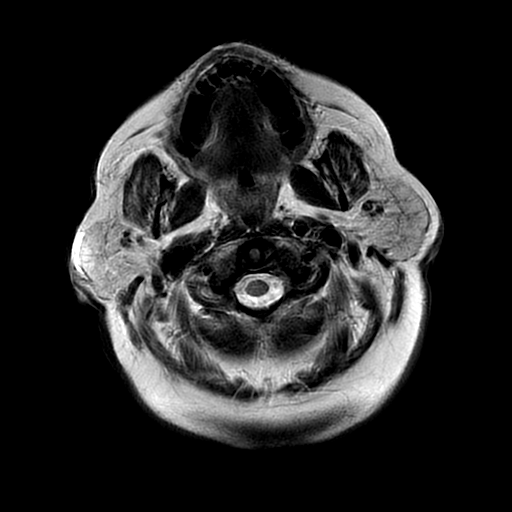
[im 25/25]
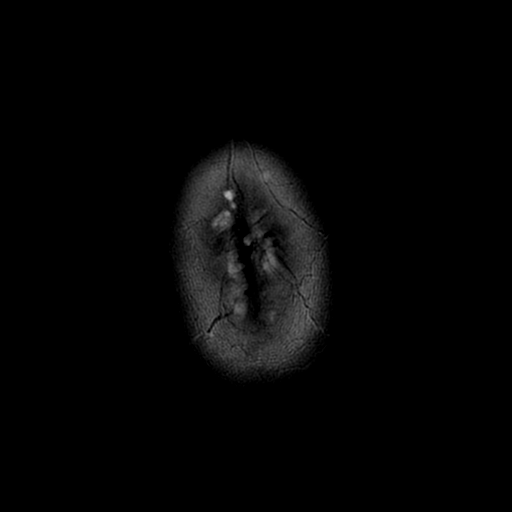

[Series 7: FLAIR · axial · 5.0mm · 0.47mm/px · z∈[-69,+75]mm · 2 of 25 slices shown (2 of 3)]
[im 1/25]
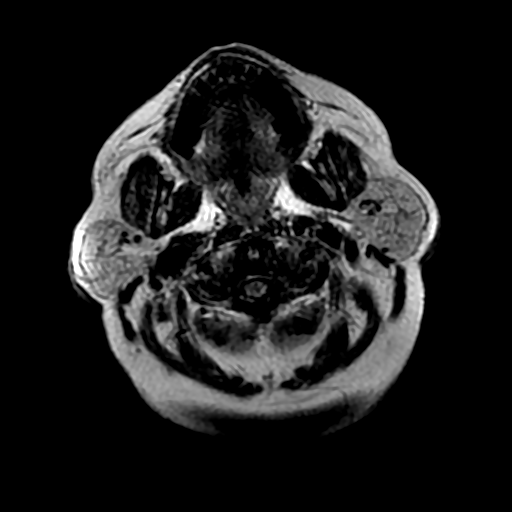
[im 25/25]
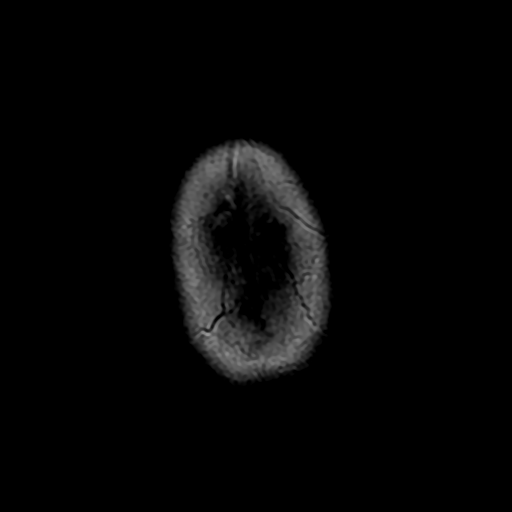

[Series 8: (person_name) · axial · 3.0mm · 0.47mm/px · 1 of 96 slices shown]
[im 1/96]
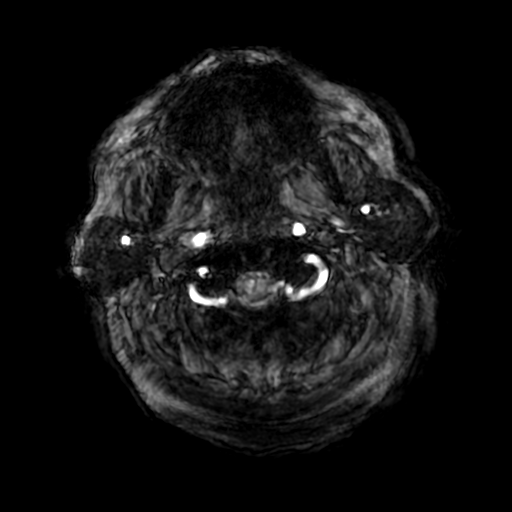

[Series 10: T2 · coronal · 5.0mm · 0.39mm/px · 3 of 28 slices shown (2 of 2)]
[im 1/28]
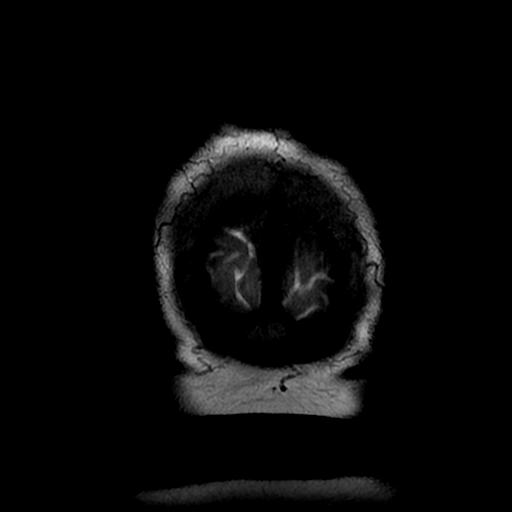
[im 14/28]
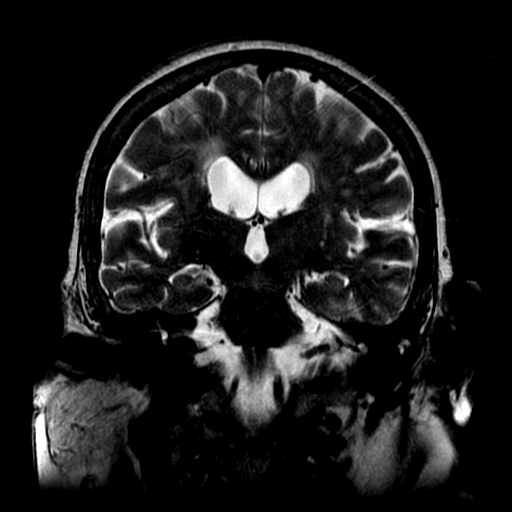
[im 28/28]
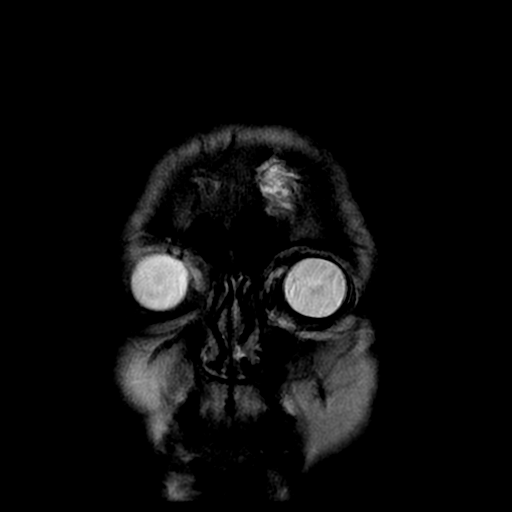

[Series 11: FLAIR · sagittal · 5.0mm · 0.47mm/px · 2 of 22 slices shown (3 of 3)]
[im 1/22]
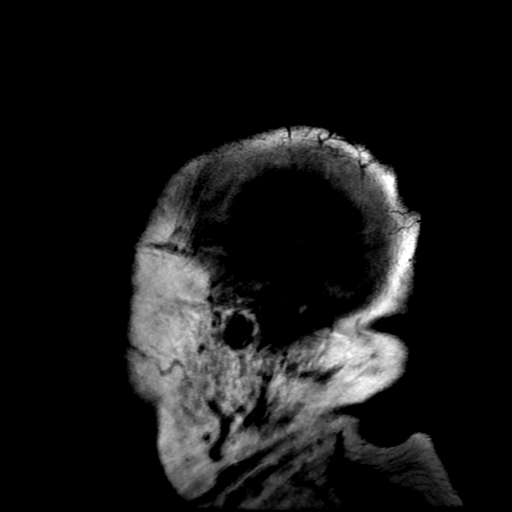
[im 22/22]
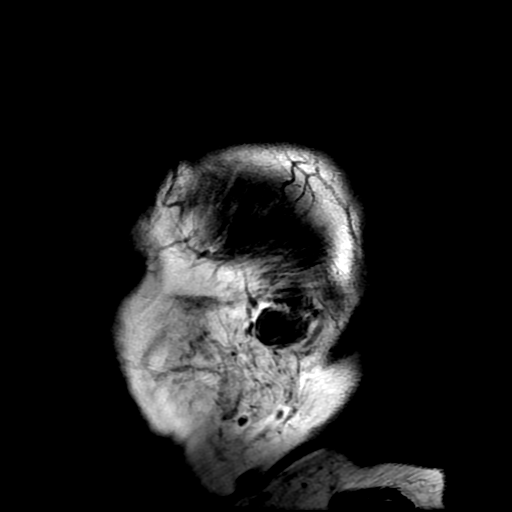

[Series 350: ADC · axial · 3.0mm · 0.94mm/px · z∈[-55,+82]mm · 4 of 47 slices shown (1 of 2)]
[im 1/47]
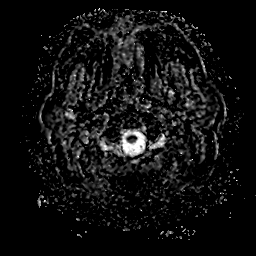
[im 16/47]
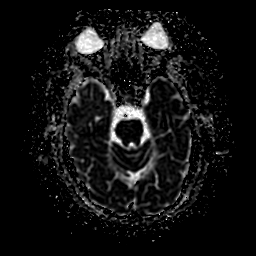
[im 31/47]
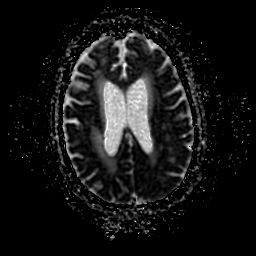
[im 47/47]
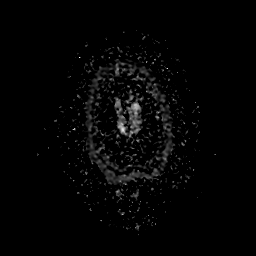

[Series 450: ADC · coronal · 4.0mm · 0.94mm/px · 3 of 33 slices shown (2 of 2)]
[im 1/33]
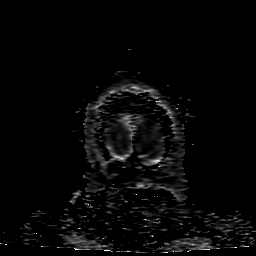
[im 17/33]
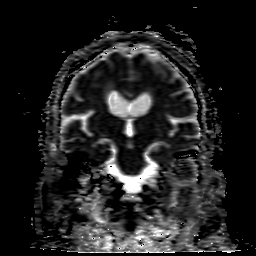
[im 33/33]
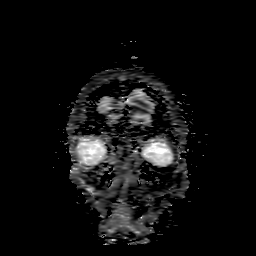

[33 of 48 positions shown; findings below may reference images not displayed]

FINDINGS: Brain: The diffusion-weighted images demonstrate no acute or
subacute infarction. A remote hemorrhagic infarct is noted in the
posterior limb of the right internal capsule and lateral thalamus
along the cortical spinal tracts. Moderate atrophy and white matter
disease is present bilaterally. Subcortical white matter changes are
asymmetric on the right subjacent to the precentral gyrus. Internal
auditory canals are within normal limits bilaterally. The brainstem
and cerebellum are normal.

Vascular: Flow is present in the major intracranial arteries.

Skull and upper cervical spine: The skullbase is within normal
limits. The craniocervical junction is normal. Slight
anterolisthesis is present at C3-4. Marrow signal is normal.

Sinuses/Orbits: The paranasal sinuses and mastoid air cells are
clear. Bilateral lens replacements are present. Globes and orbits
are within normal limits otherwise.
IMPRESSION: 1. No acute intracranial abnormality.
2. Remote hemorrhagic stroke involving the posterior limb of the
right internal capsule and lateral thalamus.
3. Moderate atrophy and diffuse white matter disease is advanced for
age.

## 2017-08-16 NOTE — Therapy (Signed)
[address] [place], [place], [address] Phone: [phone]   Fax:  [phone]  Physical Therapy Treatment  Patient Details  Name: Karen Jackson MRN: [id number] Date of Birth: 03/30/1941 Referring Provider: Andria Frames   Encounter Date: 08/16/2017  PT End of Session - 08/16/17 1233    Visit Number  20    Date for PT Re-Evaluation  08/21/17    PT Start Time  1055    PT Stop Time  1140    PT Time Calculation (min)  45 min    Activity Tolerance  Patient limited by pain;Patient limited by fatigue    Behavior During Therapy  [facility] for tasks assessed/performed       Past Medical History:  Diagnosis Date  . Acute cystitis without hematuria   . Acute diastolic CHF (congestive heart failure) ([place])   . Arthritis   . Dyspnea   . Fever of unknown origin 03/19/2017  . Hyperlipidemia   . Hypertension   . Multifocal pneumonia   . Osteopenia   . Persistent atrial fibrillation ([place])   . Stroke [facility]) 2013    Past Surgical History:  Procedure Laterality Date  . ANKLE SURGERY    . APPENDECTOMY    . CHOLECYSTECTOMY    . HERNIA REPAIR     Esophagus  . JOINT REPLACEMENT     total- right partial- left  . MASTECTOMY PARTIAL / LUMPECTOMY  2012   left    There were no vitals filed for this visit.  Subjective Assessment - 08/16/17 1144    Subjective  Patietn reports some left knee soreness, again I feel that it is how she is impulsive and dows not completely turn around to sit, she sits and torques the knee.    Currently in Pain?  Yes    Pain Score  5     Pain Location  Knee    Pain Orientation  Left    Pain Descriptors / Indicators  Sore    Aggravating Factors   unsure                      [facility] Adult PT Treatment/Exercise - 08/16/17 0001      Ambulation/Gait   Gait Comments  walked 170 feet required 4 rest breaks, after walking her O2 saturation was 88%, we walked another 170 feet with 5 rest  breaks, c/o left knee pain and shortness of breath      Knee/Hip Exercises: Seated   Other Seated Knee/Hip Exercises  red tband ankle DF, and shoulder exercises      Knee/Hip Exercises: Supine   Bridges with Ball Squeeze  2 sets;15 reps    Straight Leg Raises  2 sets;10 reps    Other Supine Knee/Hip Exercises  10 # SAQ with cues to go slow and squeeze, TKE with green tband, hip abductio with sliding board, 2# left arm punches, green tband hip extension, left hip IR      Knee/Hip Exercises: Sidelying   Clams  2x10               PT Short Term Goals - 06/05/17 1201      PT SHORT TERM GOAL #1   Title  independent with initial HEP    Status  Achieved        PT Long Term Goals - 08/16/17 1237      PT LONG TERM GOAL #1   Title  decrease  timed up and go test to 25 seconds    Status  On-going      PT LONG TERM GOAL #3   Title  walk 200 feet without rest using FWW    Status  Partially Met            Plan - 08/16/17 1233    Clinical Impression Statement  Patient with increased "soreness" of the left knee, she was also very short of breath with walking and required multiple rests to walk 170 feet.  Her O2 saturation would drop to 88 with walking but would recover within 2 minutes, I asked her about her breathing, and if she had seen the MD recently or will be going due to the fact that she is recovering from pneumonia and that she has not been short of breath as she is today since she got out of the hospital.    PT Next Visit Plan  I will see if we can continue to work on her as she has regressed since the snow days    Consulted and Agree with Plan of Care  Patient       Patient will benefit from skilled therapeutic intervention in order to improve the following deficits and impairments:  Abnormal gait, Cardiopulmonary status limiting activity, Decreased activity tolerance, Decreased mobility, Decreased endurance, Decreased range of motion, Decreased strength, Difficulty  walking, Impaired flexibility, Impaired sensation, Improper body mechanics, Pain, Impaired UE functional use  Visit Diagnosis: Muscle weakness (generalized)  Difficulty in walking, not elsewhere classified  Repeated falls  Late effects of CVA (cerebrovascular accident)  Hemiplegia and hemiparesis following cerebral infarction affecting left non-dominant side (HCC)  Ataxia, unspecified     Problem List Patient Active Problem List   Diagnosis Date Noted  . Exposure to influenza 08/09/2017  . UTI (urinary tract infection) 07/05/2017  . Nausea without vomiting 05/10/2017  . Hypoxemia   . Heart failure with preserved ejection fraction ([place]), Grade 3 diastolic dysfunction 07/37/1062  . Atrial fibrillation with RVR ([place])   . Chronic diastolic CHF (congestive heart failure) ([place])   . PAF (paroxysmal atrial fibrillation) ([place])   . History of stroke   . Dyspnea 03/19/2017  . Shortness of breath 03/19/2017  . Acute cystitis without hematuria   . Fever   . Metabolic syndrome 69/48/5462  . Encounter for preventive health examination 02/17/2016  . Morbid obesity ([place]) 06/17/2015  . Fall at home 12/01/2014  . Hypomagnesemia 04/24/2014  . Hemiparesis affecting left side as late effect of cerebrovascular accident ([place]) 04/24/2014  . Nontraumatic cerebral hemorrhage ([place]) 04/30/2012  . Diabetes type 2, controlled ([place]) 03/04/2010  . OSTEOPENIA 01/21/2009  . UNSPECIFIED VITAMIN D DEFICIENCY 11/19/2007  . ESSENTIAL HYPERTENSION, BENIGN 11/19/2007  . HYPERCHOLESTEROLEMIA 10/25/2006  . GASTROESOPHAGEAL REFLUX, NO ESOPHAGITIS 10/25/2006  . DIVERTICULOSIS OF COLON 10/25/2006  . Osteoarthritis 10/25/2006  . CERVICAL SPINE DISORDER, NOS 10/25/2006    Sumner Boast., PT 08/16/2017, 12:38 PM  [address] [place], [place], [address] Phone: [phone]   Fax:  [phone]  Name: YAMILET MCFAYDEN MRN:  [id number] Date of Birth: 17-Apr-1941

## 2017-08-22 ENCOUNTER — Ambulatory Visit: Payer: Medicare Other | Admitting: Physical Therapy

## 2017-08-22 ENCOUNTER — Other Ambulatory Visit: Payer: Self-pay | Admitting: Family Medicine

## 2017-08-22 ENCOUNTER — Encounter: Payer: Self-pay | Admitting: Physical Therapy

## 2017-08-22 DIAGNOSIS — I69354 Hemiplegia and hemiparesis following cerebral infarction affecting left non-dominant side: Secondary | ICD-10-CM

## 2017-08-22 DIAGNOSIS — R262 Difficulty in walking, not elsewhere classified: Secondary | ICD-10-CM | POA: Diagnosis not present

## 2017-08-22 DIAGNOSIS — R296 Repeated falls: Secondary | ICD-10-CM

## 2017-08-22 DIAGNOSIS — I6990 Unspecified sequelae of unspecified cerebrovascular disease: Secondary | ICD-10-CM | POA: Diagnosis not present

## 2017-08-22 DIAGNOSIS — R270 Ataxia, unspecified: Secondary | ICD-10-CM | POA: Diagnosis not present

## 2017-08-22 DIAGNOSIS — M6281 Muscle weakness (generalized): Secondary | ICD-10-CM | POA: Diagnosis not present

## 2017-08-22 NOTE — Therapy (Signed)
[address] [place], [place], [address] Phone: [phone]   Fax:  [phone]  Physical Therapy Treatment  Patient Details  Name: Karen Jackson MRN: [id number] Date of Birth: 1941/02/19 Referring Provider: Andria Frames   Encounter Date: 08/22/2017  PT End of Session - 08/22/17 1236    Visit Number  21    Date for PT Re-Evaluation  08/21/17    PT Start Time  1055    PT Stop Time  1145    PT Time Calculation (min)  50 min    Activity Tolerance  Patient tolerated treatment well    Behavior During Therapy  [facility] for tasks assessed/performed       Past Medical History:  Diagnosis Date  . Acute cystitis without hematuria   . Acute diastolic CHF (congestive heart failure) ([place])   . Arthritis   . Dyspnea   . Fever of unknown origin 03/19/2017  . Hyperlipidemia   . Hypertension   . Multifocal pneumonia   . Osteopenia   . Persistent atrial fibrillation ([place])   . Stroke [facility]) 2013    Past Surgical History:  Procedure Laterality Date  . ANKLE SURGERY    . APPENDECTOMY    . CHOLECYSTECTOMY    . HERNIA REPAIR     Esophagus  . JOINT REPLACEMENT     total- right partial- left  . MASTECTOMY PARTIAL / LUMPECTOMY  2012   left    There were no vitals filed for this visit.  Subjective Assessment - 08/22/17 1101    Subjective  Reports that she was able to get into the houses for Christmas after renting a ramp, she reports that she took her lasix and that she is breathing better and having less knee pain    Currently in Pain?  No/denies                      [facility] Adult PT Treatment/Exercise - 08/22/17 0001      Transfers   Comments  worked on bed rolling and mobility, her son is present today and reports difficulty with transfers from bed to W/C, we went over this and performed, She is very impulsive and really twists and body and is unsafe with doing this, tried to work and problem solve this  with them      Ambulation/Gait   Gait Comments  Walked HHA 200 x2, one rest break for both      Knee/Hip Exercises: Aerobic   Nustep  level 5 x 7 minutes    Other Aerobic  UBE level 4 x 4 minutes, again cues to keep moving      Knee/Hip Exercises: Supine   Bridges with [employer]  2 sets;15 reps    Straight Leg Raises  2 sets;10 reps    Other Supine Knee/Hip Exercises  10 # SAQ with cues to go slow and squeeze, TKE with green tband, hip abductio with sliding board, 2# left arm punches, green tband hip extension, left hip IR    Other Supine Knee/Hip Exercises  feet on ball K2C, trunk rotation, bridges, isomettric abdominals, ball squeeze with bridges      Knee/Hip Exercises: Sidelying   Clams  2x10               PT Short Term Goals - 06/05/17 1201      PT SHORT TERM GOAL #1   Title  independent with initial HEP  Status  Achieved        PT Long Term Goals - 08/16/17 1237      PT LONG TERM GOAL #1   Title  decrease timed up and go test to 25 seconds    Status  On-going      PT LONG TERM GOAL #3   Title  walk 200 feet without rest using FWW    Status  Partially Met            Plan - 08/22/17 1238    Clinical Impression Statement  Patient was not doing well the last time I saw her, she reports that she went back on her lasix, she reports that what she was doing was taking about 2x/week, the reason being that if she takes it she has to go to the bathroom and she won't take it if she has things to do that day, this may have been why she was so short of breath and really struggling with the walking.  She also has great difficulty with safety when transferring from the bed to the chair, she puts the hair in front of her and when she stands she flings herself into the chair, this could cause some twisting of the knee as well as she is unsafe.  Went over this with her and her son.    PT Next Visit Plan  Patient needs to be safe with transfers but due to her size and  her disabilities this is very difficult for her, we will continue to work on this and see if we can help her be safe    Consulted and Agree with Plan of Care  Patient       Patient will benefit from skilled therapeutic intervention in order to improve the following deficits and impairments:  Abnormal gait, Cardiopulmonary status limiting activity, Decreased activity tolerance, Decreased mobility, Decreased endurance, Decreased range of motion, Decreased strength, Difficulty walking, Impaired flexibility, Impaired sensation, Improper body mechanics, Pain, Impaired UE functional use  Visit Diagnosis: Muscle weakness (generalized)  Difficulty in walking, not elsewhere classified  Repeated falls  Late effects of CVA (cerebrovascular accident)  Hemiplegia and hemiparesis following cerebral infarction affecting left non-dominant side (HCC)  Ataxia, unspecified     Problem List Patient Active Problem List   Diagnosis Date Noted  . Exposure to influenza 08/09/2017  . UTI (urinary tract infection) 07/05/2017  . Nausea without vomiting 05/10/2017  . Hypoxemia   . Heart failure with preserved ejection fraction ([place]), Grade 3 diastolic dysfunction 62/94/7654  . Atrial fibrillation with RVR ([place])   . Chronic diastolic CHF (congestive heart failure) ([place])   . PAF (paroxysmal atrial fibrillation) ([place])   . History of stroke   . Dyspnea 03/19/2017  . Shortness of breath 03/19/2017  . Acute cystitis without hematuria   . Fever   . Metabolic syndrome 65/10/5463  . Encounter for preventive health examination 02/17/2016  . Morbid obesity ([place]) 06/17/2015  . Fall at home 12/01/2014  . Hypomagnesemia 04/24/2014  . Hemiparesis affecting left side as late effect of cerebrovascular accident ([place]) 04/24/2014  . Nontraumatic cerebral hemorrhage ([place]) 04/30/2012  . Diabetes type 2, controlled ([place]) 03/04/2010  . OSTEOPENIA 01/21/2009  . UNSPECIFIED VITAMIN D DEFICIENCY 11/19/2007  . ESSENTIAL  HYPERTENSION, BENIGN 11/19/2007  . HYPERCHOLESTEROLEMIA 10/25/2006  . GASTROESOPHAGEAL REFLUX, NO ESOPHAGITIS 10/25/2006  . DIVERTICULOSIS OF COLON 10/25/2006  . Osteoarthritis 10/25/2006  . CERVICAL SPINE DISORDER, NOS 10/25/2006    Sumner Boast., PT 08/22/2017, 12:43 PM  [address] [place], [place], [address] Phone: [phone]   Fax:  [phone]  Name: Karen Jackson MRN: [id number] Date of Birth: 1940/12/18

## 2017-08-24 ENCOUNTER — Encounter: Payer: Self-pay | Admitting: Physical Therapy

## 2017-08-24 ENCOUNTER — Ambulatory Visit: Payer: Medicare Other | Admitting: Physical Therapy

## 2017-08-24 DIAGNOSIS — R270 Ataxia, unspecified: Secondary | ICD-10-CM | POA: Diagnosis not present

## 2017-08-24 DIAGNOSIS — R296 Repeated falls: Secondary | ICD-10-CM

## 2017-08-24 DIAGNOSIS — M6281 Muscle weakness (generalized): Secondary | ICD-10-CM

## 2017-08-24 DIAGNOSIS — I69354 Hemiplegia and hemiparesis following cerebral infarction affecting left non-dominant side: Secondary | ICD-10-CM

## 2017-08-24 DIAGNOSIS — R262 Difficulty in walking, not elsewhere classified: Secondary | ICD-10-CM

## 2017-08-24 DIAGNOSIS — I6990 Unspecified sequelae of unspecified cerebrovascular disease: Secondary | ICD-10-CM

## 2017-08-24 NOTE — Addendum Note (Signed)
Addended by: Sumner Boast on: 08/24/2017 11:53 AM   Modules accepted: Orders

## 2017-08-24 NOTE — Therapy (Signed)
[address] [place], [place], [address] Phone: [phone]   Fax:  [phone]  Physical Therapy Treatment  Patient Details  Name: Karen Jackson MRN: [id number] Date of Birth: [birth year] Referring Provider: Andria Frames   Encounter Date: 08/24/2017  PT End of Session - 08/24/17 1136    Visit Number  22    Date for PT Re-Evaluation  09/21/17    PT Start Time  1055    PT Stop Time  1145    PT Time Calculation (min)  50 min    Activity Tolerance  Patient tolerated treatment well    Behavior During Therapy  [facility] for tasks assessed/performed       Past Medical History:  Diagnosis Date  . Acute cystitis without hematuria   . Acute diastolic CHF (congestive heart failure) ([place])   . Arthritis   . Dyspnea   . Fever of unknown origin 03/19/2017  . Hyperlipidemia   . Hypertension   . Multifocal pneumonia   . Osteopenia   . Persistent atrial fibrillation ([place])   . Stroke [facility]) 2013    Past Surgical History:  Procedure Laterality Date  . ANKLE SURGERY    . APPENDECTOMY    . CHOLECYSTECTOMY    . HERNIA REPAIR     Esophagus  . JOINT REPLACEMENT     total- right partial- left  . MASTECTOMY PARTIAL / LUMPECTOMY  2012   left    There were no vitals filed for this visit.  Subjective Assessment - 08/24/17 1113    Subjective  Reports that she has been going a lot more lately, has tolerated it well, reports some left knee pain and left ankle swelling today    Currently in Pain?  No/denies                      [facility] Adult PT Treatment/Exercise - 08/24/17 0001      Transfers   Comments  used a 6# weight on chest and had her lean back onto [place] and then use abdominals to sit up 2x10 reps      Ambulation/Gait   Gait Comments  Walked HHA 200 x2, one rest break for both      High Level Balance   High Level Balance Activities  Side stepping;Backward walking      Knee/Hip Exercises: Aerobic   Nustep  level 5 x 7 minutes    Other Aerobic  UBE level 5 x 3 minutes, fwd only as going backward caused shoulder pain      Knee/Hip Exercises: Seated   Other Seated Knee/Hip Exercises  red tband ankle DF, and shoulder exercises, tband Knee flexion      Knee/Hip Exercises: Supine   Other Supine Knee/Hip Exercises  10 # SAQ with cues to go slow and squeeze, TKE with green tband, hip abductio with sliding board, 2# left arm punches, green tband hip extension, left hip IR    Other Supine Knee/Hip Exercises  feet on ball K2C, trunk rotation, bridges, isomettric abdominals, ball squeeze with bridges               PT Short Term Goals - 06/05/17 1201      PT SHORT TERM GOAL #1   Title  independent with initial HEP    Status  Achieved        PT Long Term Goals - 08/24/17 1141      PT LONG TERM GOAL #  1   Title  decrease timed up and go test to 25 seconds    Status  On-going      PT LONG TERM GOAL #2   Title  increased right LE strength to 4-/5    Status  Partially Met      PT LONG TERM GOAL #3   Title  walk 200 feet without rest using FWW    Status  Partially Met            Plan - 08/24/17 1137    Clinical Impression Statement  Patient has had some ups and downs, she was very short of breath last week to the point of requiring numerous rests to walk 120 feet.  She reports that she went back to her Lazix and is feeling a little better, she was not taking lasix as she did not want to go to the bathroom as often due to her physical condition.  She has remained unsafe at times with transfers as she does not turn fully and swings herself into the chair, the bad thing about this is she twists her knee.  We have done some education to her and her son about safety.    PT Next Visit Plan  Patient needs to be safe with transfers but due to her size and her disabilities this is very difficult for her, we will continue to work on this and see if we can help her be safe    Consulted  and Agree with Plan of Care  Patient       Patient will benefit from skilled therapeutic intervention in order to improve the following deficits and impairments:  Abnormal gait, Cardiopulmonary status limiting activity, Decreased activity tolerance, Decreased mobility, Decreased endurance, Decreased range of motion, Decreased strength, Difficulty walking, Impaired flexibility, Impaired sensation, Improper body mechanics, Pain, Impaired UE functional use  Visit Diagnosis: Muscle weakness (generalized)  Difficulty in walking, not elsewhere classified  Repeated falls  Late effects of CVA (cerebrovascular accident)  Hemiplegia and hemiparesis following cerebral infarction affecting left non-dominant side (HCC)  Ataxia, unspecified     Problem List Patient Active Problem List   Diagnosis Date Noted  . Exposure to influenza 08/09/2017  . UTI (urinary tract infection) 07/05/2017  . Nausea without vomiting 05/10/2017  . Hypoxemia   . Heart failure with preserved ejection fraction ([place]), Grade 3 diastolic dysfunction 22/09/5425  . Atrial fibrillation with RVR ([place])   . Chronic diastolic CHF (congestive heart failure) ([place])   . PAF (paroxysmal atrial fibrillation) ([place])   . History of stroke   . Dyspnea 03/19/2017  . Shortness of breath 03/19/2017  . Acute cystitis without hematuria   . Fever   . Metabolic syndrome 02/18/7627  . Encounter for preventive health examination 02/17/2016  . Morbid obesity ([place]) 06/17/2015  . Fall at home 12/01/2014  . Hypomagnesemia 04/24/2014  . Hemiparesis affecting left side as late effect of cerebrovascular accident ([place]) 04/24/2014  . Nontraumatic cerebral hemorrhage ([place]) 04/30/2012  . Diabetes type 2, controlled ([place]) 03/04/2010  . OSTEOPENIA 01/21/2009  . UNSPECIFIED VITAMIN D DEFICIENCY 11/19/2007  . ESSENTIAL HYPERTENSION, BENIGN 11/19/2007  . HYPERCHOLESTEROLEMIA 10/25/2006  . GASTROESOPHAGEAL REFLUX, NO ESOPHAGITIS 10/25/2006  .  DIVERTICULOSIS OF COLON 10/25/2006  . Osteoarthritis 10/25/2006  . CERVICAL SPINE DISORDER, NOS 10/25/2006    Sumner Boast., PT 08/24/2017, 11:41 AM  [address] [place], [place], [address] Phone: [phone]   Fax:  [phone]  Name: Karen Jackson MRN: [id number] Date of Birth: 06-08-1941

## 2017-08-29 ENCOUNTER — Ambulatory Visit: Payer: Medicare Other | Admitting: Physical Therapy

## 2017-08-30 ENCOUNTER — Ambulatory Visit (INDEPENDENT_AMBULATORY_CARE_PROVIDER_SITE_OTHER): Payer: Medicare Other | Admitting: Family Medicine

## 2017-08-30 ENCOUNTER — Encounter: Payer: Self-pay | Admitting: Family Medicine

## 2017-08-30 ENCOUNTER — Other Ambulatory Visit: Payer: Self-pay

## 2017-08-30 DIAGNOSIS — I5030 Unspecified diastolic (congestive) heart failure: Secondary | ICD-10-CM | POA: Diagnosis not present

## 2017-08-30 DIAGNOSIS — R0600 Dyspnea, unspecified: Secondary | ICD-10-CM

## 2017-08-30 DIAGNOSIS — J9611 Chronic respiratory failure with hypoxia: Secondary | ICD-10-CM

## 2017-08-30 DIAGNOSIS — I69354 Hemiplegia and hemiparesis following cerebral infarction affecting left non-dominant side: Secondary | ICD-10-CM

## 2017-08-30 DIAGNOSIS — J9691 Respiratory failure, unspecified with hypoxia: Secondary | ICD-10-CM | POA: Insufficient documentation

## 2017-08-30 NOTE — Assessment & Plan Note (Signed)
Seems well controled except for the symptom of dyspnea.  Will check BNP

## 2017-08-30 NOTE — Patient Instructions (Signed)
You definitely need to be back on oxygen.  Whether it is permanent or temporary will depend on what I find out.  The big question is why.  The two main considerations are: 1. Heart - the congestive heart failure needs more medication 2. Lung - you had permanent lung damage from the pneumonia.  Please make an appointment with Dr. Valentina Lucks in our office for lung function tests.  The sooner the appointment, the sooner I will have answers for you.    Someone should call this afternoon or tomorrow about getting the home oxygen set up.

## 2017-08-30 NOTE — Assessment & Plan Note (Signed)
Restart home O2 at 2lper min per [place].   Investigate etiology: Pulm? Will check PFTs  Cardiac? Will recheck BNP.   Will obtain pulse ox on O2 while ambulating at next visit.  Investigate whether cardiac or pulmonary etiology.  PFTs by [place].

## 2017-08-30 NOTE — Assessment & Plan Note (Signed)
Could cause restrictive lung disease.

## 2017-08-30 NOTE — Progress Notes (Signed)
   Subjective:    Patient ID: Karen Jackson, female    DOB: Jul 30, 1941, 77 y.o.   MRN: [id number]  HPI C/o chronic shortness of breath since pneumonia last August.  Recent worsening.  Has checked pulse ox at home post ambulation and drops into low 80s.   Patient has CHF by hx - but denies ankle swelling or wt gain. Patient does not have known COPD.  Risk factors include smoking history - quit 35 years ago.  Recent pneumonia could cause scarring.  Morbid obesity and prior CVA could contribute to a restrictive lung disease.      Review of Systems  Denies chest pain.  Only DOE.  Does not feel SOB at rest.     Objective:   Physical Exam VS noted No JVD Lungs clear, no wheeze or rales. Cardiac controled rate and BP Regular rhythym. Ext trace edema.   Pulse ox at rest on room air=90% Pulse ox on room air while ambulating = 82%       Assessment & Plan:

## 2017-08-31 ENCOUNTER — Ambulatory Visit: Payer: Medicare Other | Admitting: Physical Therapy

## 2017-08-31 LAB — BRAIN NATRIURETIC PEPTIDE: BNP: 89.8 pg/mL (ref 0.0–100.0)

## 2017-09-04 ENCOUNTER — Ambulatory Visit: Payer: Medicare Other | Attending: Family Medicine | Admitting: Physical Therapy

## 2017-09-04 ENCOUNTER — Encounter: Payer: Self-pay | Admitting: Physical Therapy

## 2017-09-04 DIAGNOSIS — I69354 Hemiplegia and hemiparesis following cerebral infarction affecting left non-dominant side: Secondary | ICD-10-CM

## 2017-09-04 DIAGNOSIS — R270 Ataxia, unspecified: Secondary | ICD-10-CM

## 2017-09-04 DIAGNOSIS — R262 Difficulty in walking, not elsewhere classified: Secondary | ICD-10-CM

## 2017-09-04 DIAGNOSIS — M6281 Muscle weakness (generalized): Secondary | ICD-10-CM | POA: Diagnosis not present

## 2017-09-04 DIAGNOSIS — I6930 Unspecified sequelae of cerebral infarction: Secondary | ICD-10-CM

## 2017-09-04 DIAGNOSIS — R296 Repeated falls: Secondary | ICD-10-CM

## 2017-09-04 DIAGNOSIS — I6990 Unspecified sequelae of unspecified cerebrovascular disease: Secondary | ICD-10-CM | POA: Diagnosis not present

## 2017-09-04 NOTE — Therapy (Signed)
[address] [place], [place], [address] Phone: [phone]   Fax:  [phone]  Physical Therapy Treatment  Patient Details  Name: Karen Jackson MRN: [id number] Date of Birth: [birth year] Referring Provider: Andria Frames   Encounter Date: 09/04/2017  PT End of Session - 09/04/17 1138    Visit Number  23    Date for PT Re-Evaluation  09/21/17    PT Start Time  1100    PT Stop Time  1138    PT Time Calculation (min)  38 min    Activity Tolerance  Patient limited by fatigue    Behavior During Therapy  [facility] for tasks assessed/performed       Past Medical History:  Diagnosis Date  . Acute cystitis without hematuria   . Acute diastolic CHF (congestive heart failure) ([place])   . Arthritis   . Dyspnea   . Fever of unknown origin 03/19/2017  . Hyperlipidemia   . Hypertension   . Multifocal pneumonia   . Osteopenia   . Persistent atrial fibrillation ([place])   . Stroke [facility]) 2013    Past Surgical History:  Procedure Laterality Date  . ANKLE SURGERY    . APPENDECTOMY    . CHOLECYSTECTOMY    . HERNIA REPAIR     Esophagus  . JOINT REPLACEMENT     total- right partial- left  . MASTECTOMY PARTIAL / LUMPECTOMY  2012   left    There were no vitals filed for this visit.  Subjective Assessment - 09/04/17 1103    Subjective  Patient has not been in to see [place] in over a week, she has been having breathing issues, I asked her to see the MD about 2 weeks ago, she has called and has an appointment on Monday    Currently in Pain?  No/denies                      [facility] Adult PT Treatment/Exercise - 09/04/17 0001      Ambulation/Gait   Gait Comments  HHA walking 30 feet x 2, O2 saturation would drop to 86%, quick recovery to 91% after 1 minute of rest      Knee/Hip Exercises: Aerobic   Nustep  prior to starting she had O2 saturation of 90%, asked her to do pursed lip breathing and it went up to 94%, Level 5  x 8 minutes, after her O2 saturation was 85%, recovered to 90 % in 1 minutes      Knee/Hip Exercises: Seated   Other Seated Knee/Hip Exercises  red tband ankle DF, and shoulder exercises, tband Knee flexion, tband ankle eversion, 2.5# marching and LAQ's 2x10 each, Bosu behind her and did sit ups 2x10               PT Short Term Goals - 06/05/17 1201      PT SHORT TERM GOAL #1   Title  independent with initial HEP    Status  Achieved        PT Long Term Goals - 09/04/17 1140      PT LONG TERM GOAL #2   Title  increased right LE strength to 4-/5    Status  On-going      PT LONG TERM GOAL #3   Title  walk 200 feet without rest using FWW    Status  On-going            Plan -  09/04/17 1138    Clinical Impression Statement  Patient continues to report not taking lasix as prescribed, she is very short of breath with exertion, her O2 saturation at rest is 90-93% with activity it is dropping to 84% at times.  She does have a quick recover back to 90% after 1 minute of rest.  She reports that she is very short of breath after any walking.  She reports to me that she will be seeing a lung doctor on Monday    PT Next Visit Plan  I have asked her to be sure to tell the MD about not taking the lasix and assure that she see MD about her breathing    Consulted and Agree with Plan of Care  Patient       Patient will benefit from skilled therapeutic intervention in order to improve the following deficits and impairments:  Abnormal gait, Cardiopulmonary status limiting activity, Decreased activity tolerance, Decreased mobility, Decreased endurance, Decreased range of motion, Decreased strength, Difficulty walking, Impaired flexibility, Impaired sensation, Improper body mechanics, Pain, Impaired UE functional use  Visit Diagnosis: Muscle weakness (generalized)  Difficulty in walking, not elsewhere classified  Repeated falls  Late effects of CVA (cerebrovascular  accident)  Hemiplegia and hemiparesis following cerebral infarction affecting left non-dominant side (HCC)  Ataxia, unspecified     Problem List Patient Active Problem List   Diagnosis Date Noted  . Respiratory failure with hypoxia ([place]) 08/30/2017  . Exposure to influenza 08/09/2017  . UTI (urinary tract infection) 07/05/2017  . Nausea without vomiting 05/10/2017  . Hypoxemia   . Heart failure with preserved ejection fraction ([place]), Grade 3 diastolic dysfunction 94/85/4627  . Atrial fibrillation with RVR ([place])   . Chronic diastolic CHF (congestive heart failure) ([place])   . PAF (paroxysmal atrial fibrillation) ([place])   . History of stroke   . Dyspnea 03/19/2017  . Shortness of breath 03/19/2017  . Acute cystitis without hematuria   . Fever   . Metabolic syndrome 03/50/0938  . Encounter for preventive health examination 02/17/2016  . Morbid obesity ([place]) 06/17/2015  . Fall at home 12/01/2014  . Hypomagnesemia 04/24/2014  . Hemiparesis affecting left side as late effect of cerebrovascular accident ([place]) 04/24/2014  . Nontraumatic cerebral hemorrhage ([place]) 04/30/2012  . Diabetes type 2, controlled ([place]) 03/04/2010  . OSTEOPENIA 01/21/2009  . UNSPECIFIED VITAMIN D DEFICIENCY 11/19/2007  . ESSENTIAL HYPERTENSION, BENIGN 11/19/2007  . HYPERCHOLESTEROLEMIA 10/25/2006  . GASTROESOPHAGEAL REFLUX, NO ESOPHAGITIS 10/25/2006  . DIVERTICULOSIS OF COLON 10/25/2006  . Osteoarthritis 10/25/2006  . CERVICAL SPINE DISORDER, NOS 10/25/2006    Sumner Boast., PT 09/04/2017, 11:49 AM  [address] [place], [place], [address] Phone: [phone]   Fax:  [phone]  Name: EMBERLEIGH REILY MRN: [id number] Date of Birth: 02-Dec-1940

## 2017-09-06 ENCOUNTER — Encounter: Payer: Self-pay | Admitting: Physical Therapy

## 2017-09-06 ENCOUNTER — Ambulatory Visit: Payer: Medicare Other | Admitting: Physical Therapy

## 2017-09-06 DIAGNOSIS — M6281 Muscle weakness (generalized): Secondary | ICD-10-CM | POA: Diagnosis not present

## 2017-09-06 DIAGNOSIS — I6990 Unspecified sequelae of unspecified cerebrovascular disease: Secondary | ICD-10-CM

## 2017-09-06 DIAGNOSIS — R270 Ataxia, unspecified: Secondary | ICD-10-CM

## 2017-09-06 DIAGNOSIS — I69354 Hemiplegia and hemiparesis following cerebral infarction affecting left non-dominant side: Secondary | ICD-10-CM | POA: Diagnosis not present

## 2017-09-06 DIAGNOSIS — R262 Difficulty in walking, not elsewhere classified: Secondary | ICD-10-CM | POA: Diagnosis not present

## 2017-09-06 DIAGNOSIS — R296 Repeated falls: Secondary | ICD-10-CM

## 2017-09-06 NOTE — Therapy (Signed)
[address] [place], [place], [address] Phone: [phone]   Fax:  [phone]  Physical Therapy Treatment  Patient Details  Name: Karen Jackson MRN: [id number] Date of Birth: May 03, 1941 Referring Provider: Andria Frames   Encounter Date: 09/06/2017  PT End of Session - 09/06/17 1146    Visit Number  24    Date for PT Re-Evaluation  09/21/17    PT Start Time  1055    PT Stop Time  1140    PT Time Calculation (min)  45 min    Activity Tolerance  Patient limited by fatigue    Behavior During Therapy  [facility] for tasks assessed/performed       Past Medical History:  Diagnosis Date  . Acute cystitis without hematuria   . Acute diastolic CHF (congestive heart failure) ([place])   . Arthritis   . Dyspnea   . Fever of unknown origin 03/19/2017  . Hyperlipidemia   . Hypertension   . Multifocal pneumonia   . Osteopenia   . Persistent atrial fibrillation ([place])   . Stroke [facility]) 2013    Past Surgical History:  Procedure Laterality Date  . ANKLE SURGERY    . APPENDECTOMY    . CHOLECYSTECTOMY    . HERNIA REPAIR     Esophagus  . JOINT REPLACEMENT     total- right partial- left  . MASTECTOMY PARTIAL / LUMPECTOMY  2012   left    There were no vitals filed for this visit.  Subjective Assessment - 09/06/17 1109    Subjective  Patient reports that her right leg was snapping back and her left ankle was rolling yesterday and she could not walk yesterday.  She reports that she did start taking hte lasix     Currently in Pain?  No/denies                      [facility] Adult PT Treatment/Exercise - 09/06/17 0001      Transfers   Comments  worked on bed mobility moving up and down in bed as well ast getting up from supine and worked on some rolling      Ambulation/Gait   Gait Comments  HHA 35' x 2       Knee/Hip Exercises: Aerobic   Nustep  Level 5 x 9 minutes    Other Aerobic  UBE level 5 x 3 minutes,  fwd only as going backward caused shoulder pain      Knee/Hip Exercises: Seated   Other Seated Knee/Hip Exercises  red tband ankle DF, and shoulder exercises, tband Knee flexion, tband ankle eversion, 5# marching and LAQ's 2x10 each, Bosu behind her and did sit ups 2x10      Knee/Hip Exercises: Supine   Bridges with Ball Squeeze  15 reps;3 sets    Straight Leg Raises  2 sets;10 reps    Other Supine Knee/Hip Exercises  feet on ball K2C, trunk rotation, bridges, isomettric abdominals, ball squeeze with bridges      Knee/Hip Exercises: Sidelying   Clams  2x10               PT Short Term Goals - 06/05/17 1201      PT SHORT TERM GOAL #1   Title  independent with initial HEP    Status  Achieved        PT Long Term Goals - 09/04/17 1140      PT LONG TERM  GOAL #2   Title  increased right LE strength to 4-/5    Status  On-going      PT LONG TERM GOAL #3   Title  walk 200 feet without rest using FWW    Status  On-going            Plan - 09/06/17 1147    Clinical Impression Statement  O2 saturation continues to drop to the mid 80's with exertion, she is seeing a pulmonologist on Monday, she does report to me that she is doing better with taking the lasix but states she is not taking as prescribed.  Her ability to walk and do exercises is limited due to the breathing but she also has issues with the left ankle rolling today, needs cues to focus on the ankle to put it in the correct place    PT Next Visit Plan  She will see MD on Monday    Consulted and Agree with Plan of Care  Patient       Patient will benefit from skilled therapeutic intervention in order to improve the following deficits and impairments:  Abnormal gait, Cardiopulmonary status limiting activity, Decreased activity tolerance, Decreased mobility, Decreased endurance, Decreased range of motion, Decreased strength, Difficulty walking, Impaired flexibility, Impaired sensation, Improper body mechanics, Pain,  Impaired UE functional use  Visit Diagnosis: Muscle weakness (generalized)  Difficulty in walking, not elsewhere classified  Repeated falls  Late effects of CVA (cerebrovascular accident)  Hemiplegia and hemiparesis following cerebral infarction affecting left non-dominant side (HCC)  Ataxia, unspecified     Problem List Patient Active Problem List   Diagnosis Date Noted  . Respiratory failure with hypoxia ([place]) 08/30/2017  . Exposure to influenza 08/09/2017  . UTI (urinary tract infection) 07/05/2017  . Nausea without vomiting 05/10/2017  . Hypoxemia   . Heart failure with preserved ejection fraction ([place]), Grade 3 diastolic dysfunction 19/50/9326  . Atrial fibrillation with RVR ([place])   . Chronic diastolic CHF (congestive heart failure) ([place])   . PAF (paroxysmal atrial fibrillation) ([place])   . History of stroke   . Dyspnea 03/19/2017  . Shortness of breath 03/19/2017  . Acute cystitis without hematuria   . Fever   . Metabolic syndrome 71/24/5809  . Encounter for preventive health examination 02/17/2016  . Morbid obesity ([place]) 06/17/2015  . Fall at home 12/01/2014  . Hypomagnesemia 04/24/2014  . Hemiparesis affecting left side as late effect of cerebrovascular accident ([place]) 04/24/2014  . Nontraumatic cerebral hemorrhage ([place]) 04/30/2012  . Diabetes type 2, controlled ([place]) 03/04/2010  . OSTEOPENIA 01/21/2009  . UNSPECIFIED VITAMIN D DEFICIENCY 11/19/2007  . ESSENTIAL HYPERTENSION, BENIGN 11/19/2007  . HYPERCHOLESTEROLEMIA 10/25/2006  . GASTROESOPHAGEAL REFLUX, NO ESOPHAGITIS 10/25/2006  . DIVERTICULOSIS OF COLON 10/25/2006  . Osteoarthritis 10/25/2006  . CERVICAL SPINE DISORDER, NOS 10/25/2006    Sumner Boast., PT 09/06/2017, 11:50 AM  [address] [place], [place], [address] Phone: [phone]   Fax:  [phone]  Name: CEONNA FRAZZINI MRN: [id number] Date of Birth:  14-Jun-1941

## 2017-09-07 ENCOUNTER — Ambulatory Visit: Payer: Medicare Other | Admitting: Pharmacist

## 2017-09-10 ENCOUNTER — Ambulatory Visit (INDEPENDENT_AMBULATORY_CARE_PROVIDER_SITE_OTHER): Payer: Medicare Other | Admitting: Pharmacist

## 2017-09-10 ENCOUNTER — Encounter: Payer: Self-pay | Admitting: Pharmacist

## 2017-09-10 DIAGNOSIS — R0600 Dyspnea, unspecified: Secondary | ICD-10-CM | POA: Diagnosis present

## 2017-09-10 NOTE — Patient Instructions (Addendum)
Thanks for coming in today.   Your oxygen saturation with walking dropped to 86% when you walked  ~ 30 feet.  Keep using your home oxygen at this time.    Your lung function test showed reduced amounts of exhaled air in the first second of exhalation and reduced overall amount of air.  After the albuterol nebulizer treatment your lung function test was about the same.   Follow up visit with Dr. Andria Frames in 2-4 weeks to discuss portable oxygen.

## 2017-09-10 NOTE — Assessment & Plan Note (Signed)
Spirometry evaluation reveals Severe restrictive lung disease. Post nebulized albuterol tx revealed no change in pulmonary function.  Patient has been experiencing oxygen desaturation down to 83% ambulating greater than 30 feet while not on oxygen and is not currently taking any medications for breathing. Pt is currently on home oxygen.  No changes to medications at this time. No change in treatment plan at this time.  Reviewed results of pulmonary function tests.

## 2017-09-10 NOTE — Progress Notes (Signed)
   S:    Patient arrives in good spirits in wheelchair, accompanied by her niece. Presents for lung function evaluation.   Patient was referred on 08/30/2017.  Patient was last seen by Dr. Andria Frames on 08/30/2017. Patient on home oxygen reports difficulty breathing and chest tightness when ambulating with walker. Patient denies trouble breathing at rest and would like to discuss portable oxygen use.   O: mMRC score= >2 (uses wheelchair primarily for moving around).  Uses walker around the house.  CAT score= 16 See Documentation Flowsheet - CAT/COPD for complete symptom scoring.  See "scanned report" or Documentation Flowsheet (discrete results - PFTs) for  Spirometry results. Patient provided good effort while attempting spirometry.   O2 saturation with ambulation using a walker 86% after walking ~ 30 feet.   Lung Age = 79 Albuterol Neb  Lot# [id number]     Exp. 01/26/2019  A/P: Spirometry evaluation reveals Severe restrictive lung disease. Post nebulized albuterol tx revealed no change in pulmonary function.  Patient has been experiencing oxygen desaturation down to 86% ambulating greater than 30 feet while not on oxygen and is not currently taking any medications for breathing. Pt is currently on home oxygen.  No changes to medications at this time. No change in treatment plan at this time.  Reviewed results of pulmonary function tests.  Pt verbalized understanding of results and education.  Written pt instructions provided.  F/U Clinic visit in 2-4 weeks with Dr. Andria Frames. Total time in face to face counseling 45 minutes. Patient seen with Onnie Boer, PharmD Candidate.   Marland Kitchen

## 2017-09-11 ENCOUNTER — Encounter: Payer: Self-pay | Admitting: Physical Therapy

## 2017-09-11 ENCOUNTER — Ambulatory Visit: Payer: Medicare Other | Admitting: Physical Therapy

## 2017-09-11 DIAGNOSIS — I6990 Unspecified sequelae of unspecified cerebrovascular disease: Secondary | ICD-10-CM

## 2017-09-11 DIAGNOSIS — M6281 Muscle weakness (generalized): Secondary | ICD-10-CM

## 2017-09-11 DIAGNOSIS — R296 Repeated falls: Secondary | ICD-10-CM | POA: Diagnosis not present

## 2017-09-11 DIAGNOSIS — R262 Difficulty in walking, not elsewhere classified: Secondary | ICD-10-CM

## 2017-09-11 DIAGNOSIS — R270 Ataxia, unspecified: Secondary | ICD-10-CM | POA: Diagnosis not present

## 2017-09-11 DIAGNOSIS — I69354 Hemiplegia and hemiparesis following cerebral infarction affecting left non-dominant side: Secondary | ICD-10-CM

## 2017-09-11 NOTE — Progress Notes (Signed)
Patient ID: Karen Jackson, female   DOB: 04-Aug-1941, 77 y.o.   MRN: [id number] Reviewed: Agree with Dr. Graylin Shiver documentation and management.

## 2017-09-11 NOTE — Therapy (Signed)
[address] [place], [place], [address] Phone: [phone]   Fax:  [phone]  Physical Therapy Treatment  Patient Details  Name: Karen Jackson MRN: [id number] Date of Birth: 28-Jul-1941 Referring Provider: Andria Frames   Encounter Date: 09/11/2017  PT End of Session - 09/11/17 1155    Visit Number  25    Date for PT Re-Evaluation  09/21/17    PT Start Time  1014    PT Stop Time  1100    PT Time Calculation (min)  46 min    Activity Tolerance  Patient limited by fatigue    Behavior During Therapy  [facility] for tasks assessed/performed       Past Medical History:  Diagnosis Date  . Acute cystitis without hematuria   . Acute diastolic CHF (congestive heart failure) ([place])   . Arthritis   . Dyspnea   . Fever of unknown origin 03/19/2017  . Hyperlipidemia   . Hypertension   . Multifocal pneumonia   . Osteopenia   . Persistent atrial fibrillation ([place])   . Stroke [facility]) 2013    Past Surgical History:  Procedure Laterality Date  . ANKLE SURGERY    . APPENDECTOMY    . CHOLECYSTECTOMY    . HERNIA REPAIR     Esophagus  . JOINT REPLACEMENT     total- right partial- left  . MASTECTOMY PARTIAL / LUMPECTOMY  2012   left    There were no vitals filed for this visit.  Subjective Assessment - 09/11/17 1018    Subjective  Saw MD Pulmonologist, felt like the SOB was due to lung damage and being on oxygen.  Feels like she can get back to walking more as PLOF wih PT    Currently in Pain?  No/denies                      [facility] Adult PT Treatment/Exercise - 09/11/17 0001      Ambulation/Gait   Gait Comments  HHA20' x 2, then 45' x 1      Exercises   Exercises  Knee/Hip      Knee/Hip Exercises: Aerobic   Nustep  Level 5 x 9 minutes, O2 after to 88%    Other Aerobic  UBE level 5 x 3 minutes, fwd only as going backward caused shoulder pain      Knee/Hip Exercises: Standing   Knee Flexion  2  sets;10 reps    Knee Flexion Limitations  2#    Hip ADduction  2 sets;10 reps    Other Standing Knee Exercises  4" toe clears, really needs close gaurding as she tends to not pay attention to the left leg and it buckle and she sits quickly      Knee/Hip Exercises: Seated   Long Arc Quad  20 reps;Weights    Long Arc Quad Weight  2 lbs.    Other Seated Knee/Hip Exercises  red tband ankle DF, and shoulder exercises, tband Knee flexion, tband ankle eversion, 5# marching and LAQ's 2x10 each, Bosu behind her and did sit ups 2x10    Sit to Sand  with UE support;2 sets;5 reps               PT Short Term Goals - 06/05/17 1201      PT SHORT TERM GOAL #1   Title  independent with initial HEP    Status  Achieved  PT Long Term Goals - 09/04/17 1140      PT LONG TERM GOAL #2   Title  increased right LE strength to 4-/5    Status  On-going      PT LONG TERM GOAL #3   Title  walk 200 feet without rest using FWW    Status  On-going            Plan - 09/11/17 1155    Clinical Impression Statement  Pulmonologist felt like with PT patient could return to her PLOF.  We need to really get her stronger, as she has regressed with her breathing issues having to have CGA and close supervision with verbal and tactile cues to stand on the left leg and attempt to raise the right leg    PT Next Visit Plan  continue to work on achieving PLOF    Consulted and Agree with Plan of Care  Patient       Patient will benefit from skilled therapeutic intervention in order to improve the following deficits and impairments:  Abnormal gait, Cardiopulmonary status limiting activity, Decreased activity tolerance, Decreased mobility, Decreased endurance, Decreased range of motion, Decreased strength, Difficulty walking, Impaired flexibility, Impaired sensation, Improper body mechanics, Pain, Impaired UE functional use  Visit Diagnosis: Muscle weakness (generalized)  Repeated falls  Difficulty in  walking, not elsewhere classified  Late effects of CVA (cerebrovascular accident)  Hemiplegia and hemiparesis following cerebral infarction affecting left non-dominant side (HCC)  Ataxia, unspecified     Problem List Patient Active Problem List   Diagnosis Date Noted  . Respiratory failure with hypoxia ([place]) 08/30/2017  . Exposure to influenza 08/09/2017  . UTI (urinary tract infection) 07/05/2017  . Nausea without vomiting 05/10/2017  . Hypoxemia   . Heart failure with preserved ejection fraction ([place]), Grade 3 diastolic dysfunction 78/29/5621  . Atrial fibrillation with RVR ([place])   . Chronic diastolic CHF (congestive heart failure) ([place])   . PAF (paroxysmal atrial fibrillation) ([place])   . History of stroke   . Dyspnea 03/19/2017  . Shortness of breath 03/19/2017  . Acute cystitis without hematuria   . Fever   . Metabolic syndrome 30/86/5784  . Encounter for preventive health examination 02/17/2016  . Morbid obesity ([place]) 06/17/2015  . Fall at home 12/01/2014  . Hypomagnesemia 04/24/2014  . Hemiparesis affecting left side as late effect of cerebrovascular accident ([place]) 04/24/2014  . Nontraumatic cerebral hemorrhage ([place]) 04/30/2012  . Diabetes type 2, controlled ([place]) 03/04/2010  . OSTEOPENIA 01/21/2009  . UNSPECIFIED VITAMIN D DEFICIENCY 11/19/2007  . ESSENTIAL HYPERTENSION, BENIGN 11/19/2007  . HYPERCHOLESTEROLEMIA 10/25/2006  . GASTROESOPHAGEAL REFLUX, NO ESOPHAGITIS 10/25/2006  . DIVERTICULOSIS OF COLON 10/25/2006  . Osteoarthritis 10/25/2006  . CERVICAL SPINE DISORDER, NOS 10/25/2006    Sumner Boast., PT 09/11/2017, 11:57 AM  [address] [place], [place], [address] Phone: [phone]   Fax:  [phone]  Name: Karen Jackson MRN: [id number] Date of Birth: November 11, 1940

## 2017-09-12 ENCOUNTER — Telehealth: Payer: Self-pay | Admitting: *Deleted

## 2017-09-12 NOTE — Telephone Encounter (Signed)
Per Darlina Guys at [facility] the following O2 Sats have to be done on same date in order for Medicare reimbursement. Patient scheduled tomorrow in nurse clinic for the following:   O2 sat __% on RA at rest  O2 sat __% on RA with ambulation  O2 sat __% on __L O2 with ambulation   L. Silvano Rusk, RN, BSN

## 2017-09-12 NOTE — Telephone Encounter (Signed)
Noted  

## 2017-09-13 ENCOUNTER — Ambulatory Visit (INDEPENDENT_AMBULATORY_CARE_PROVIDER_SITE_OTHER): Payer: Medicare Other | Admitting: *Deleted

## 2017-09-13 ENCOUNTER — Other Ambulatory Visit: Payer: Self-pay | Admitting: Family Medicine

## 2017-09-13 DIAGNOSIS — J9611 Chronic respiratory failure with hypoxia: Secondary | ICD-10-CM

## 2017-09-13 DIAGNOSIS — R0600 Dyspnea, unspecified: Secondary | ICD-10-CM

## 2017-09-13 NOTE — Progress Notes (Signed)
   Patient presents with caregiver via wheelchair for documentation of oxygen saturations.  O2 sat 89% on RA at rest  O2 sat 87 on RA with ambulation via walker ~ 30 ft O2 sat 94% on 2L O2 with ambulation via walker  Will make Darlina Guys at [facility] aware. Hubbard Hartshorn, RN, BSN

## 2017-09-14 ENCOUNTER — Encounter: Payer: Self-pay | Admitting: Physical Therapy

## 2017-09-14 ENCOUNTER — Ambulatory Visit: Payer: Medicare Other | Admitting: Physical Therapy

## 2017-09-14 DIAGNOSIS — M6281 Muscle weakness (generalized): Secondary | ICD-10-CM

## 2017-09-14 DIAGNOSIS — R262 Difficulty in walking, not elsewhere classified: Secondary | ICD-10-CM

## 2017-09-14 DIAGNOSIS — R270 Ataxia, unspecified: Secondary | ICD-10-CM | POA: Diagnosis not present

## 2017-09-14 DIAGNOSIS — I6990 Unspecified sequelae of unspecified cerebrovascular disease: Secondary | ICD-10-CM | POA: Diagnosis not present

## 2017-09-14 DIAGNOSIS — I69354 Hemiplegia and hemiparesis following cerebral infarction affecting left non-dominant side: Secondary | ICD-10-CM | POA: Diagnosis not present

## 2017-09-14 DIAGNOSIS — R296 Repeated falls: Secondary | ICD-10-CM

## 2017-09-14 NOTE — Therapy (Signed)
[place] [address] [place], [place], [address] Phone: [phone]   Fax:  [phone]  Physical Therapy Treatment  Patient Details  Name: Karen Jackson MRN: [id number] Date of Birth: 11-29-1940 Referring Provider: Andria Frames   Encounter Date: 09/14/2017  PT End of Session - 09/14/17 1219    Visit Number  26    Date for PT Re-Evaluation  09/21/17    PT Start Time  1004    PT Stop Time  1050    PT Time Calculation (min)  46 min    Activity Tolerance  Patient limited by fatigue    Behavior During Therapy  [facility] for tasks assessed/performed       Past Medical History:  Diagnosis Date  . Acute cystitis without hematuria   . Acute diastolic CHF (congestive heart failure) ([place])   . Arthritis   . Dyspnea   . Fever of unknown origin 03/19/2017  . Hyperlipidemia   . Hypertension   . Multifocal pneumonia   . Osteopenia   . Persistent atrial fibrillation ([place])   . Stroke [facility]) 2013    Past Surgical History:  Procedure Laterality Date  . ANKLE SURGERY    . APPENDECTOMY    . CHOLECYSTECTOMY    . HERNIA REPAIR     Esophagus  . JOINT REPLACEMENT     total- right partial- left  . MASTECTOMY PARTIAL / LUMPECTOMY  2012   left    There were no vitals filed for this visit.  Subjective Assessment - 09/14/17 1014    Subjective  Was tested yesterday, reports that she walked without O2 and was at 85%, then with 2L O2 was up to 95%    Currently in Pain?  No/denies                      [facility] Adult PT Treatment/Exercise - 09/14/17 0001      Ambulation/Gait   Gait Comments  HHA 45' x 2      Knee/Hip Exercises: Aerobic   Nustep  Level 5 x 9 minutes, O2 after to 88%      Knee/Hip Exercises: Standing   Knee Flexion  2 sets;10 reps    Knee Flexion Limitations  2#    Hip Flexion  2 sets;10 reps;Limitations    Hip Flexion Limitations  2#    Hip ADduction  2 sets;10 reps    Other Standing Knee Exercises   4" toe clears, really needs close gaurding as she tends to not pay attention to the left leg and it buckle and she sits quickly      Knee/Hip Exercises: Seated   Long Arc Quad  20 reps;Weights    Long Arc Quad Weight  3 lbs.    Other Seated Knee/Hip Exercises  red tband ankle DF, and shoulder exercises, tband Knee flexion, tband ankle eversion, 5# marching and LAQ's 2x10 each, Bosu behind her and did sit ups 2x10    Other Seated Knee/Hip Exercises  Did PWR moves, reaching, to challenge her sitting balance and get her trunk and core active               PT Short Term Goals - 06/05/17 1201      PT SHORT TERM GOAL #1   Title  independent with initial HEP    Status  Achieved        PT Long Term Goals - 09/14/17 1221  PT LONG TERM GOAL #1   Title  decrease timed up and go test to 25 seconds    Status  On-going            Plan - 09/14/17 1220    Clinical Impression Statement  Tolerated increased walking distance without much difficulty, her O2 sats did fall to 85% but again she recovers very quickly, she is checking on oxygen at home,     PT Next Visit Plan  continue to work on achieving PLOF    Consulted and Agree with Plan of Care  Patient       Patient will benefit from skilled therapeutic intervention in order to improve the following deficits and impairments:  Abnormal gait, Cardiopulmonary status limiting activity, Decreased activity tolerance, Decreased mobility, Decreased endurance, Decreased range of motion, Decreased strength, Difficulty walking, Impaired flexibility, Impaired sensation, Improper body mechanics, Pain, Impaired UE functional use  Visit Diagnosis: Muscle weakness (generalized)  Repeated falls  Difficulty in walking, not elsewhere classified  Late effects of CVA (cerebrovascular accident)  Hemiplegia and hemiparesis following cerebral infarction affecting left non-dominant side (HCC)  Ataxia, unspecified     Problem List Patient  Active Problem List   Diagnosis Date Noted  . Respiratory failure with hypoxia ([place]) 08/30/2017  . Exposure to influenza 08/09/2017  . UTI (urinary tract infection) 07/05/2017  . Nausea without vomiting 05/10/2017  . Hypoxemia   . Heart failure with preserved ejection fraction ([place]), Grade 3 diastolic dysfunction 18/56/3149  . Atrial fibrillation with RVR ([place])   . Chronic diastolic CHF (congestive heart failure) ([place])   . PAF (paroxysmal atrial fibrillation) ([place])   . History of stroke   . Dyspnea 03/19/2017  . Shortness of breath 03/19/2017  . Acute cystitis without hematuria   . Fever   . Metabolic syndrome 70/26/3785  . Encounter for preventive health examination 02/17/2016  . Morbid obesity ([place]) 06/17/2015  . Fall at home 12/01/2014  . Hypomagnesemia 04/24/2014  . Hemiparesis affecting left side as late effect of cerebrovascular accident ([place]) 04/24/2014  . Nontraumatic cerebral hemorrhage ([place]) 04/30/2012  . Diabetes type 2, controlled ([place]) 03/04/2010  . OSTEOPENIA 01/21/2009  . UNSPECIFIED VITAMIN D DEFICIENCY 11/19/2007  . ESSENTIAL HYPERTENSION, BENIGN 11/19/2007  . HYPERCHOLESTEROLEMIA 10/25/2006  . GASTROESOPHAGEAL REFLUX, NO ESOPHAGITIS 10/25/2006  . DIVERTICULOSIS OF COLON 10/25/2006  . Osteoarthritis 10/25/2006  . CERVICAL SPINE DISORDER, NOS 10/25/2006    Sumner Boast., PT 09/14/2017, 12:22 PM  [address] [place], [place], [address] Phone: [phone]   Fax:  [phone]  Name: ILKA LOVICK MRN: [id number] Date of Birth: 1941-05-28

## 2017-09-18 ENCOUNTER — Encounter: Payer: Self-pay | Admitting: Physical Therapy

## 2017-09-18 ENCOUNTER — Ambulatory Visit: Payer: Medicare Other | Admitting: Physical Therapy

## 2017-09-18 DIAGNOSIS — I6990 Unspecified sequelae of unspecified cerebrovascular disease: Secondary | ICD-10-CM | POA: Diagnosis not present

## 2017-09-18 DIAGNOSIS — I69354 Hemiplegia and hemiparesis following cerebral infarction affecting left non-dominant side: Secondary | ICD-10-CM | POA: Diagnosis not present

## 2017-09-18 DIAGNOSIS — M6281 Muscle weakness (generalized): Secondary | ICD-10-CM | POA: Diagnosis not present

## 2017-09-18 DIAGNOSIS — R296 Repeated falls: Secondary | ICD-10-CM | POA: Diagnosis not present

## 2017-09-18 DIAGNOSIS — R270 Ataxia, unspecified: Secondary | ICD-10-CM | POA: Diagnosis not present

## 2017-09-18 DIAGNOSIS — R262 Difficulty in walking, not elsewhere classified: Secondary | ICD-10-CM

## 2017-09-18 NOTE — Therapy (Signed)
[place] [address] [place], [place], [address] Phone: [phone]   Fax:  [phone]  Physical Therapy Treatment  Patient Details  Name: Karen [place] MRN: [id number] Date of Birth: August 02, 1941 Referring Provider: Andria Frames   Encounter Date: 09/18/2017  PT End of Session - 09/18/17 1051    Visit Number  27    Date for PT Re-Evaluation  09/21/17    PT Start Time  1006    PT Stop Time  1100    PT Time Calculation (min)  54 min    Activity Tolerance  Patient limited by pain    Behavior During Therapy  [facility] for tasks assessed/performed       Past Medical History:  Diagnosis Date  . Acute cystitis without hematuria   . Acute diastolic CHF (congestive heart failure) ([place])   . Arthritis   . Dyspnea   . Fever of unknown origin 03/19/2017  . Hyperlipidemia   . Hypertension   . Multifocal pneumonia   . Osteopenia   . Persistent atrial fibrillation ([place])   . Stroke [facility]) 2013    Past Surgical History:  Procedure Laterality Date  . ANKLE SURGERY    . APPENDECTOMY    . CHOLECYSTECTOMY    . HERNIA REPAIR     Esophagus  . JOINT REPLACEMENT     total- right partial- left  . MASTECTOMY PARTIAL / LUMPECTOMY  2012   left    There were no vitals filed for this visit.  Subjective Assessment - 09/18/17 1013    Subjective  Patient reports that she fwll last night, reports that she had on old yoga socks without nubs on the bottom and slipped, she reports some increased left ankle pain.    Currently in Pain?  Yes    Pain Score  5     Pain Location  Ankle    Pain Orientation  Left    Pain Descriptors / Indicators  Aching;Sore                      [facility] Adult PT Treatment/Exercise - 09/18/17 0001      Transfers   Comments  worked on bed mobility moving up and down in bed as well ast getting up from supine and worked on some rolling, we were on the narrow beds and she was very fearful of falling,  needed much reassurance to let her know she was not going to fall      Ambulation/Gait   Gait Comments  HHA 50'x3 come cues for picking up feet      Knee/Hip Exercises: Aerobic   Nustep  Level 5 x 9 minutes, O2 after to 88%    Other Aerobic  UBE level 5 x 3 minutes, fwd only as going backward caused shoulder pain      Knee/Hip Exercises: Machines for Strengthening   Cybex Knee Flexion  20# 3x10    Other Machine  5# HS curl, 5# left arm pulls 2 ways      Knee/Hip Exercises: Standing   Knee Flexion  2 sets;10 reps    Knee Flexion Limitations  2#    Hip Flexion  2 sets;10 reps;Limitations    Hip Flexion Limitations  2#      Knee/Hip Exercises: Seated   Other Seated Knee/Hip Exercises  Did PWR moves, reaching, to challenge her sitting balance and get her trunk and core active      Knee/Hip  Exercises: Supine   Other Supine Knee/Hip Exercises  feet on ball K2C, trunk rotation, bridges, isomettric abdominals, ball squeeze with bridges      Knee/Hip Exercises: Sidelying   Clams  2x10               PT Short Term Goals - 06/05/17 1201      PT SHORT TERM GOAL #1   Title  independent with initial HEP    Status  Achieved        PT Long Term Goals - 09/14/17 1221      PT LONG TERM GOAL #1   Title  decrease timed up and go test to 25 seconds    Status  On-going            Plan - 09/18/17 1052    Clinical Impression Statement  Patient with a fall last night where her socks did not have the nubs on it and she slid down, had to call fire dept to get her up, reports some increased pain in the left ankle.  She did well with the wallking today    PT Next Visit Plan  continue to work on achieving PLOF    Consulted and Agree with Plan of Care  Patient       Patient will benefit from skilled therapeutic intervention in order to improve the following deficits and impairments:  Abnormal gait, Cardiopulmonary status limiting activity, Decreased activity tolerance, Decreased  mobility, Decreased endurance, Decreased range of motion, Decreased strength, Difficulty walking, Impaired flexibility, Impaired sensation, Improper body mechanics, Pain, Impaired UE functional use  Visit Diagnosis: Muscle weakness (generalized)  Repeated falls  Difficulty in walking, not elsewhere classified  Late effects of CVA (cerebrovascular accident)  Hemiplegia and hemiparesis following cerebral infarction affecting left non-dominant side (HCC)  Ataxia, unspecified     Problem List Patient Active Problem List   Diagnosis Date Noted  . Respiratory failure with hypoxia ([place]) 08/30/2017  . Exposure to influenza 08/09/2017  . UTI (urinary tract infection) 07/05/2017  . Nausea without vomiting 05/10/2017  . Hypoxemia   . Heart failure with preserved ejection fraction ([place]), Grade 3 diastolic dysfunction 56/38/7564  . Atrial fibrillation with RVR ([place])   . Chronic diastolic CHF (congestive heart failure) ([place])   . PAF (paroxysmal atrial fibrillation) ([place])   . History of stroke   . Dyspnea 03/19/2017  . Shortness of breath 03/19/2017  . Acute cystitis without hematuria   . Fever   . Metabolic syndrome 33/29/5188  . Encounter for preventive health examination 02/17/2016  . Morbid obesity ([place]) 06/17/2015  . Fall at home 12/01/2014  . Hypomagnesemia 04/24/2014  . Hemiparesis affecting left side as late effect of cerebrovascular accident ([place]) 04/24/2014  . Nontraumatic cerebral hemorrhage ([place]) 04/30/2012  . Diabetes type 2, controlled ([place]) 03/04/2010  . OSTEOPENIA 01/21/2009  . UNSPECIFIED VITAMIN D DEFICIENCY 11/19/2007  . ESSENTIAL HYPERTENSION, BENIGN 11/19/2007  . HYPERCHOLESTEROLEMIA 10/25/2006  . GASTROESOPHAGEAL REFLUX, NO ESOPHAGITIS 10/25/2006  . DIVERTICULOSIS OF COLON 10/25/2006  . Osteoarthritis 10/25/2006  . CERVICAL SPINE DISORDER, NOS 10/25/2006    Sumner Boast., PT 09/18/2017, 10:53 AM  [address] [place], [place], [address] Phone: [phone]   Fax:  [phone]  Name: JHANA GIARRATANO MRN: [id number] Date of Birth: [birth year]

## 2017-09-20 ENCOUNTER — Other Ambulatory Visit: Payer: Self-pay | Admitting: Family Medicine

## 2017-09-21 ENCOUNTER — Encounter: Payer: Self-pay | Admitting: Physical Therapy

## 2017-09-21 ENCOUNTER — Ambulatory Visit: Payer: Medicare Other | Admitting: Physical Therapy

## 2017-09-21 DIAGNOSIS — I69354 Hemiplegia and hemiparesis following cerebral infarction affecting left non-dominant side: Secondary | ICD-10-CM | POA: Diagnosis not present

## 2017-09-21 DIAGNOSIS — I6990 Unspecified sequelae of unspecified cerebrovascular disease: Secondary | ICD-10-CM | POA: Diagnosis not present

## 2017-09-21 DIAGNOSIS — R270 Ataxia, unspecified: Secondary | ICD-10-CM

## 2017-09-21 DIAGNOSIS — R296 Repeated falls: Secondary | ICD-10-CM | POA: Diagnosis not present

## 2017-09-21 DIAGNOSIS — M6281 Muscle weakness (generalized): Secondary | ICD-10-CM | POA: Diagnosis not present

## 2017-09-21 DIAGNOSIS — R262 Difficulty in walking, not elsewhere classified: Secondary | ICD-10-CM

## 2017-09-21 NOTE — Therapy (Signed)
[place] [address] [place], [place], [address] Phone: [phone]   Fax:  [phone]  Physical Therapy Treatment  Patient Details  Name: Karen [place] MRN: [id number] Date of Birth: 1941-06-03 Referring Provider: Andria Frames   Encounter Date: 09/21/2017  PT End of Session - 09/21/17 1050    Visit Number  28    Date for PT Re-Evaluation  10/22/17    PT Start Time  1003    PT Stop Time  1052    PT Time Calculation (min)  49 min    Activity Tolerance  Patient tolerated treatment well    Behavior During Therapy  [facility] for tasks assessed/performed       Past Medical History:  Diagnosis Date  . Acute cystitis without hematuria   . Acute diastolic CHF (congestive heart failure) ([place])   . Arthritis   . Dyspnea   . Fever of unknown origin 03/19/2017  . Hyperlipidemia   . Hypertension   . Multifocal pneumonia   . Osteopenia   . Persistent atrial fibrillation ([place])   . Stroke [facility]) 2013    Past Surgical History:  Procedure Laterality Date  . ANKLE SURGERY    . APPENDECTOMY    . CHOLECYSTECTOMY    . HERNIA REPAIR     Esophagus  . JOINT REPLACEMENT     total- right partial- left  . MASTECTOMY PARTIAL / LUMPECTOMY  2012   left    There were no vitals filed for this visit.  Subjective Assessment - 09/21/17 1028    Subjective  Patient reports some bruising from the fall and a little sore.    Currently in Pain?  No/denies                      [facility] Adult PT Treatment/Exercise - 09/21/17 0001      Transfers   Comments  worked on bed mobility moving up and down in bed as well ast getting up from supine and worked on some rolling, we were on the narrow beds and she was very fearful of falling, needed much reassurance to let her know she was not going to fall      Ambulation/Gait   Gait Comments  HHA 50'x2 come cues for picking up feet, then 80 feet x1 with HHA      Knee/Hip Exercises: Aerobic   Nustep  Level 5 x 9 minutes, O2 after to 88%      Knee/Hip Exercises: Supine   Short Arc Quad Sets  3 sets;10 reps;Limitations    Short Arc Quad Sets Limitations  5#    Bridges with [employer] Squeeze  3 sets;10 reps    Other Supine Knee/Hip Exercises  use of sliding board for left knee flexion and hip abduction, also worked on left hip IR    Other Supine Knee/Hip Exercises  trunk rotaiton lower half and upper half               PT Short Term Goals - 06/05/17 1201      PT SHORT TERM GOAL #1   Title  independent with initial HEP    Status  Achieved        PT Long Term Goals - 09/21/17 1051      PT LONG TERM GOAL #1   Title  decrease timed up and go test to 25 seconds    Status  On-going      PT LONG TERM  GOAL #4   Title  no falls over a 6 week period    Status  On-going            Plan - 09/21/17 1050    Clinical Impression Statement  Patient seems to have recovered from her fall, she is still getting SOB.  Walking further distances with O2 saturation dropping to 82%.  She continues to exhibit neglect of the left side.    PT Next Visit Plan  continue to work on achieving PLOF    Consulted and Agree with Plan of Care  Patient       Patient will benefit from skilled therapeutic intervention in order to improve the following deficits and impairments:  Abnormal gait, Cardiopulmonary status limiting activity, Decreased activity tolerance, Decreased mobility, Decreased endurance, Decreased range of motion, Decreased strength, Difficulty walking, Impaired flexibility, Impaired sensation, Improper body mechanics, Pain, Impaired UE functional use  Visit Diagnosis: Muscle weakness (generalized)  Repeated falls  Difficulty in walking, not elsewhere classified  Late effects of CVA (cerebrovascular accident)  Hemiplegia and hemiparesis following cerebral infarction affecting left non-dominant side (HCC)  Ataxia, unspecified     Problem List Patient Active Problem  List   Diagnosis Date Noted  . Respiratory failure with hypoxia ([place]) 08/30/2017  . Exposure to influenza 08/09/2017  . UTI (urinary tract infection) 07/05/2017  . Nausea without vomiting 05/10/2017  . Hypoxemia   . Heart failure with preserved ejection fraction ([place]), Grade 3 diastolic dysfunction 42/87/6811  . Atrial fibrillation with RVR ([place])   . Chronic diastolic CHF (congestive heart failure) ([place])   . PAF (paroxysmal atrial fibrillation) ([place])   . History of stroke   . Dyspnea 03/19/2017  . Shortness of breath 03/19/2017  . Acute cystitis without hematuria   . Fever   . Metabolic syndrome 57/26/2035  . Encounter for preventive health examination 02/17/2016  . Morbid obesity ([place]) 06/17/2015  . Fall at home 12/01/2014  . Hypomagnesemia 04/24/2014  . Hemiparesis affecting left side as late effect of cerebrovascular accident ([place]) 04/24/2014  . Nontraumatic cerebral hemorrhage ([place]) 04/30/2012  . Diabetes type 2, controlled ([place]) 03/04/2010  . OSTEOPENIA 01/21/2009  . UNSPECIFIED VITAMIN D DEFICIENCY 11/19/2007  . ESSENTIAL HYPERTENSION, BENIGN 11/19/2007  . HYPERCHOLESTEROLEMIA 10/25/2006  . GASTROESOPHAGEAL REFLUX, NO ESOPHAGITIS 10/25/2006  . DIVERTICULOSIS OF COLON 10/25/2006  . Osteoarthritis 10/25/2006  . CERVICAL SPINE DISORDER, NOS 10/25/2006    Sumner Boast., PT 09/21/2017, 10:54 AM  [address] [place], [place], [address] Phone: [phone]   Fax:  [phone]  Name: Karen [place] MRN: [id number] Date of Birth: [birth year]

## 2017-09-24 ENCOUNTER — Encounter: Payer: Self-pay | Admitting: Physical Therapy

## 2017-09-24 ENCOUNTER — Ambulatory Visit: Payer: Medicare Other | Admitting: Physical Therapy

## 2017-09-24 DIAGNOSIS — M6281 Muscle weakness (generalized): Secondary | ICD-10-CM | POA: Diagnosis not present

## 2017-09-24 DIAGNOSIS — R296 Repeated falls: Secondary | ICD-10-CM | POA: Diagnosis not present

## 2017-09-24 DIAGNOSIS — I69354 Hemiplegia and hemiparesis following cerebral infarction affecting left non-dominant side: Secondary | ICD-10-CM | POA: Diagnosis not present

## 2017-09-24 DIAGNOSIS — R270 Ataxia, unspecified: Secondary | ICD-10-CM | POA: Diagnosis not present

## 2017-09-24 DIAGNOSIS — R262 Difficulty in walking, not elsewhere classified: Secondary | ICD-10-CM | POA: Diagnosis not present

## 2017-09-24 DIAGNOSIS — I6990 Unspecified sequelae of unspecified cerebrovascular disease: Secondary | ICD-10-CM

## 2017-09-24 NOTE — Therapy (Signed)
[address] [place], [place], [address] Phone: [phone]   Fax:  [phone]  Physical Therapy Treatment  Patient Details  Name: Karen [place] MRN: [id number] Date of Birth: 02/13/1941 Referring Provider: Andria Frames   Encounter Date: 09/24/2017  PT End of Session - 09/24/17 1149    Visit Number  29    Date for PT Re-Evaluation  10/22/17    PT Start Time  1004    PT Stop Time  1053    PT Time Calculation (min)  49 min    Activity Tolerance  Patient tolerated treatment well    Behavior During Therapy  [facility] for tasks assessed/performed       Past Medical History:  Diagnosis Date  . Acute cystitis without hematuria   . Acute diastolic CHF (congestive heart failure) ([place])   . Arthritis   . Dyspnea   . Fever of unknown origin 03/19/2017  . Hyperlipidemia   . Hypertension   . Multifocal pneumonia   . Osteopenia   . Persistent atrial fibrillation ([place])   . Stroke [facility]) 2013    Past Surgical History:  Procedure Laterality Date  . ANKLE SURGERY    . APPENDECTOMY    . CHOLECYSTECTOMY    . HERNIA REPAIR     Esophagus  . JOINT REPLACEMENT     total- right partial- left  . MASTECTOMY PARTIAL / LUMPECTOMY  2012   left    There were no vitals filed for this visit.  Subjective Assessment - 09/24/17 1018    Subjective  Patient reports no falls, reports that she feels short of breath today    Currently in Pain?  No/denies                      [facility] Adult PT Treatment/Exercise - 09/24/17 0001      Ambulation/Gait   Gait Comments  HHA 50'x2 come cues for picking up feet, then 80 feet x2 with HHA      Knee/Hip Exercises: Aerobic   Nustep  Level 5 x 8 minutes working on getting 600 steps in a shorter period.  O2 after was 87%      Knee/Hip Exercises: Standing   Hip ADduction  2 sets;10 reps;Limitations    Hip Abduction  2 sets;10 reps    Abduction Limitations  2#      Knee/Hip  Exercises: Seated   Long Arc Quad  20 reps;Weights    Long Arc Quad Weight  3 lbs.    Ball Squeeze  30 reps    Other Seated Knee/Hip Exercises  5# pulley for scapular retraction left UE only, then use of wand with 2# chest press and biceps curls, again working to get left arm active, she does have a tremendous amount of crepitus that limits [place] in being able to get the arm working to decrease the neglect    Other Seated Knee/Hip Exercises  Did PWR moves, reaching, to challenge her sitting balance and get her trunk and core active    Marching  2 sets;10 reps    Marching Limitations  3#               PT Short Term Goals - 06/05/17 1201      PT SHORT TERM GOAL #1   Title  independent with initial HEP    Status  Achieved        PT Long Term Goals - 09/21/17 1051  PT LONG TERM GOAL #1   Title  decrease timed up and go test to 25 seconds    Status  On-going      PT LONG TERM GOAL #4   Title  no falls over a 6 week period    Status  On-going            Plan - 09/24/17 1149    Clinical Impression Statement  Patient ahs severe crepitus in many joint and this causes pain, we are trying to build strength without causing pain, and assure that she does not neglect the left side which she does often and tends to do more when she is not feeling well.  Increased walking distance today without much shortness of breath    PT Next Visit Plan  continue to work on achieving PLOF    Consulted and Agree with Plan of Care  Patient       Patient will benefit from skilled therapeutic intervention in order to improve the following deficits and impairments:  Abnormal gait, Cardiopulmonary status limiting activity, Decreased activity tolerance, Decreased mobility, Decreased endurance, Decreased range of motion, Decreased strength, Difficulty walking, Impaired flexibility, Impaired sensation, Improper body mechanics, Pain, Impaired UE functional use  Visit Diagnosis: Muscle weakness  (generalized)  Repeated falls  Difficulty in walking, not elsewhere classified  Late effects of CVA (cerebrovascular accident)  Hemiplegia and hemiparesis following cerebral infarction affecting left non-dominant side (HCC)  Ataxia, unspecified     Problem List Patient Active Problem List   Diagnosis Date Noted  . Respiratory failure with hypoxia ([place]) 08/30/2017  . Exposure to influenza 08/09/2017  . UTI (urinary tract infection) 07/05/2017  . Nausea without vomiting 05/10/2017  . Hypoxemia   . Heart failure with preserved ejection fraction ([place]), Grade 3 diastolic dysfunction 58/85/0277  . Atrial fibrillation with RVR ([place])   . Chronic diastolic CHF (congestive heart failure) ([place])   . PAF (paroxysmal atrial fibrillation) ([place])   . History of stroke   . Dyspnea 03/19/2017  . Shortness of breath 03/19/2017  . Acute cystitis without hematuria   . Fever   . Metabolic syndrome 41/28/7867  . Encounter for preventive health examination 02/17/2016  . Morbid obesity ([place]) 06/17/2015  . Fall at home 12/01/2014  . Hypomagnesemia 04/24/2014  . Hemiparesis affecting left side as late effect of cerebrovascular accident ([place]) 04/24/2014  . Nontraumatic cerebral hemorrhage ([place]) 04/30/2012  . Diabetes type 2, controlled ([place]) 03/04/2010  . OSTEOPENIA 01/21/2009  . UNSPECIFIED VITAMIN D DEFICIENCY 11/19/2007  . ESSENTIAL HYPERTENSION, BENIGN 11/19/2007  . HYPERCHOLESTEROLEMIA 10/25/2006  . GASTROESOPHAGEAL REFLUX, NO ESOPHAGITIS 10/25/2006  . DIVERTICULOSIS OF COLON 10/25/2006  . Osteoarthritis 10/25/2006  . CERVICAL SPINE DISORDER, NOS 10/25/2006    Sumner Boast 09/24/2017, 11:52 AM., PT  [facility] [address] [place], [place], [address] Phone: [phone]   Fax:  [phone]  Name: CONYA ELLINWOOD MRN: [id number] Date of Birth: October 11, 1940

## 2017-09-26 ENCOUNTER — Other Ambulatory Visit: Payer: Self-pay

## 2017-09-26 ENCOUNTER — Ambulatory Visit (INDEPENDENT_AMBULATORY_CARE_PROVIDER_SITE_OTHER): Payer: Medicare Other | Admitting: Family Medicine

## 2017-09-26 ENCOUNTER — Encounter: Payer: Self-pay | Admitting: Family Medicine

## 2017-09-26 VITALS — BP 122/64 | HR 58 | Temp 98.2°F | Wt 181.6 lb

## 2017-09-26 DIAGNOSIS — I5032 Chronic diastolic (congestive) heart failure: Secondary | ICD-10-CM | POA: Diagnosis not present

## 2017-09-26 DIAGNOSIS — E1121 Type 2 diabetes mellitus with diabetic nephropathy: Secondary | ICD-10-CM

## 2017-09-26 DIAGNOSIS — J9611 Chronic respiratory failure with hypoxia: Secondary | ICD-10-CM

## 2017-09-26 LAB — POCT GLYCOSYLATED HEMOGLOBIN (HGB A1C): Hemoglobin A1C: 6.3

## 2017-09-26 NOTE — Progress Notes (Signed)
3

## 2017-09-26 NOTE — Patient Instructions (Addendum)
Your diabetes is nicely controled by diet alone.   No more aspirin.   Get an eye exam and have your doc send me a copy of the results.

## 2017-09-27 ENCOUNTER — Ambulatory Visit: Payer: Medicare Other | Admitting: Physical Therapy

## 2017-09-27 ENCOUNTER — Encounter: Payer: Self-pay | Admitting: Family Medicine

## 2017-09-27 ENCOUNTER — Encounter: Payer: Self-pay | Admitting: Physical Therapy

## 2017-09-27 DIAGNOSIS — R270 Ataxia, unspecified: Secondary | ICD-10-CM | POA: Diagnosis not present

## 2017-09-27 DIAGNOSIS — R296 Repeated falls: Secondary | ICD-10-CM

## 2017-09-27 DIAGNOSIS — R262 Difficulty in walking, not elsewhere classified: Secondary | ICD-10-CM

## 2017-09-27 DIAGNOSIS — M6281 Muscle weakness (generalized): Secondary | ICD-10-CM | POA: Diagnosis not present

## 2017-09-27 DIAGNOSIS — I69354 Hemiplegia and hemiparesis following cerebral infarction affecting left non-dominant side: Secondary | ICD-10-CM | POA: Diagnosis not present

## 2017-09-27 DIAGNOSIS — I6990 Unspecified sequelae of unspecified cerebrovascular disease: Secondary | ICD-10-CM

## 2017-09-27 NOTE — Assessment & Plan Note (Signed)
Well controled.  Needs eye exam

## 2017-09-27 NOTE — Progress Notes (Signed)
   Subjective:    Patient ID: Karen [place], female    DOB: July 18, 1941, 77 y.o.   MRN: [id number]  HPI  FU dyspnea/hypoxia on exertion.  Better.  Improved ability to ambulate with O2.  BNP was neg, doubt chf was contributing.  Has restrictive lung disease by PFTs likely from combo of stroke and obesity.  DM Still diet controled.  A1C actually improved.  Needs eye exam.    Previous hemorrhagic CVA.  Because of age and previous hemorrhage, no more ASA.      Review of Systems     Objective:   Physical Exam VS noted Wt down only 1-2 lbs with taking lasix every day.   Lungs clear Cardiac RRR without m or g Ext no edema.        Assessment & Plan:

## 2017-09-27 NOTE — Therapy (Signed)
[address] [place], [place], [address] Phone: [phone]   Fax:  [phone]  Physical Therapy Treatment  Patient Details  Name: Karen Jackson MRN: [id number] Date of Birth: November 06, 1940 Referring Provider: Andria Frames   Encounter Date: 09/27/2017  PT End of Session - 09/27/17 1106    Visit Number  30    Date for PT Re-Evaluation  10/22/17    PT Start Time  1005    PT Stop Time  1055    PT Time Calculation (min)  50 min    Activity Tolerance  Patient tolerated treatment well    Behavior During Therapy  [facility] for tasks assessed/performed       Past Medical History:  Diagnosis Date  . Acute cystitis without hematuria   . Acute diastolic CHF (congestive heart failure) ([place])   . Arthritis   . Dyspnea   . Fever of unknown origin 03/19/2017  . Hyperlipidemia   . Hypertension   . Multifocal pneumonia   . Osteopenia   . Persistent atrial fibrillation ([place])   . Stroke [facility]) 2013    Past Surgical History:  Procedure Laterality Date  . ANKLE SURGERY    . APPENDECTOMY    . CHOLECYSTECTOMY    . HERNIA REPAIR     Esophagus  . JOINT REPLACEMENT     total- right partial- left  . MASTECTOMY PARTIAL / LUMPECTOMY  2012   left    There were no vitals filed for this visit.  Subjective Assessment - 09/27/17 1102    Subjective  Patient reports that she saw her MD yesterday, he feels like the lungs do have damage and she will need oxygen.  She reports that hte MD really thinks that if she loses weight it would be better overall health wise.    Currently in Pain?  No/denies                      [facility] Adult PT Treatment/Exercise - 09/27/17 0001      Ambulation/Gait   Gait Comments  More walking today, 60' x 4, and then 100 feet x 2      Knee/Hip Exercises: Aerobic   Nustep  Level 5 x 8 minutes working on getting 600 steps in a shorter period.  O2 after was 87%      Knee/Hip Exercises: Seated   Long Arc Quad  20 reps;Weights;Both    Long Arc Quad Weight  5 lbs.    Ball Squeeze  30 reps    Other Seated Knee/Hip Exercises  did 5# on her ankles and worked on flexion and abduction up onto 4" step, then put the 4" step in front of her and had her flex hip and extend the knee clearing the 4" step    Other Seated Knee/Hip Exercises  ball abdominal isometrics    Marching  2 sets;10 reps    Marching Limitations  5               PT Short Term Goals - 06/05/17 1201      PT SHORT TERM GOAL #1   Title  independent with initial HEP    Status  Achieved        PT Long Term Goals - 09/27/17 1108      PT LONG TERM GOAL #1   Title  decrease timed up and go test to 25 seconds    Status  On-going  Plan - 09/27/17 1106    Clinical Impression Statement  Patient really wants to lose weight, she has many co morbidities and difficulties with her ability to do things safely, I feel at this time we are the best people to see her to help advance her abilities to her PLOF.      PT Next Visit Plan  work on the gait, her endurance to tolerate activities and be healthier    Consulted and Agree with Plan of Care  Patient       Patient will benefit from skilled therapeutic intervention in order to improve the following deficits and impairments:  Abnormal gait, Cardiopulmonary status limiting activity, Decreased activity tolerance, Decreased mobility, Decreased endurance, Decreased range of motion, Decreased strength, Difficulty walking, Impaired flexibility, Impaired sensation, Improper body mechanics, Pain, Impaired UE functional use  Visit Diagnosis: Muscle weakness (generalized)  Repeated falls  Difficulty in walking, not elsewhere classified  Late effects of CVA (cerebrovascular accident)  Hemiplegia and hemiparesis following cerebral infarction affecting left non-dominant side (HCC)  Ataxia, unspecified     Problem List Patient Active Problem List   Diagnosis  Date Noted  . Respiratory failure with hypoxia ([place]) 08/30/2017  . Exposure to influenza 08/09/2017  . UTI (urinary tract infection) 07/05/2017  . Nausea without vomiting 05/10/2017  . Hypoxemia   . Heart failure with preserved ejection fraction ([place]), Grade 3 diastolic dysfunction 91/79/1505  . Atrial fibrillation with RVR ([place])   . Chronic diastolic CHF (congestive heart failure) ([place])   . PAF (paroxysmal atrial fibrillation) ([place])   . History of stroke   . Dyspnea 03/19/2017  . Shortness of breath 03/19/2017  . Acute cystitis without hematuria   . Fever   . Metabolic syndrome 69/79/4801  . Encounter for preventive health examination 02/17/2016  . Morbid obesity ([place]) 06/17/2015  . Fall at home 12/01/2014  . Hypomagnesemia 04/24/2014  . Hemiparesis affecting left side as late effect of cerebrovascular accident ([place]) 04/24/2014  . Nontraumatic cerebral hemorrhage ([place]) 04/30/2012  . Diabetes type 2, controlled ([place]) 03/04/2010  . OSTEOPENIA 01/21/2009  . UNSPECIFIED VITAMIN D DEFICIENCY 11/19/2007  . ESSENTIAL HYPERTENSION, BENIGN 11/19/2007  . HYPERCHOLESTEROLEMIA 10/25/2006  . GASTROESOPHAGEAL REFLUX, NO ESOPHAGITIS 10/25/2006  . DIVERTICULOSIS OF COLON 10/25/2006  . Osteoarthritis 10/25/2006  . CERVICAL SPINE DISORDER, NOS 10/25/2006    Sumner Boast., PT 09/27/2017, 11:09 AM  [address] [place], [place], [address] Phone: [phone]   Fax:  [phone]  Name: LEYTON BROWNLEE MRN: [id number] Date of Birth: 1941-04-12

## 2017-09-27 NOTE — Assessment & Plan Note (Signed)
Wt stable.  BMI 37 but co-morbid factors.

## 2017-09-27 NOTE — Assessment & Plan Note (Signed)
Seems stable on current meds.  Seems to be at dry wt.

## 2017-10-02 ENCOUNTER — Encounter: Payer: Self-pay | Admitting: Physical Therapy

## 2017-10-02 ENCOUNTER — Ambulatory Visit: Payer: Medicare Other | Attending: Family Medicine | Admitting: Physical Therapy

## 2017-10-02 DIAGNOSIS — I6990 Unspecified sequelae of unspecified cerebrovascular disease: Secondary | ICD-10-CM | POA: Diagnosis not present

## 2017-10-02 DIAGNOSIS — R296 Repeated falls: Secondary | ICD-10-CM | POA: Insufficient documentation

## 2017-10-02 DIAGNOSIS — M6281 Muscle weakness (generalized): Secondary | ICD-10-CM | POA: Insufficient documentation

## 2017-10-02 DIAGNOSIS — R252 Cramp and spasm: Secondary | ICD-10-CM | POA: Diagnosis not present

## 2017-10-02 DIAGNOSIS — R262 Difficulty in walking, not elsewhere classified: Secondary | ICD-10-CM | POA: Insufficient documentation

## 2017-10-02 DIAGNOSIS — I69354 Hemiplegia and hemiparesis following cerebral infarction affecting left non-dominant side: Secondary | ICD-10-CM | POA: Insufficient documentation

## 2017-10-02 DIAGNOSIS — R270 Ataxia, unspecified: Secondary | ICD-10-CM

## 2017-10-02 NOTE — Therapy (Signed)
[address] [place], [place], [address] Phone: [phone]   Fax:  [phone]  Physical Therapy Treatment  Patient Details  Name: Karen Jackson MRN: [id number] Date of Birth: Sep 13, 1940 Referring Provider: Andria Frames   Encounter Date: 10/02/2017  PT End of Session - 10/02/17 1258    Visit Number  31    Date for PT Re-Evaluation  10/22/17    PT Start Time  1006    PT Stop Time  1052    PT Time Calculation (min)  46 min    Activity Tolerance  Patient tolerated treatment well    Behavior During Therapy  [facility] for tasks assessed/performed       Past Medical History:  Diagnosis Date  . Acute cystitis without hematuria   . Acute diastolic CHF (congestive heart failure) ([place])   . Arthritis   . Dyspnea   . Fever of unknown origin 03/19/2017  . Hyperlipidemia   . Hypertension   . Multifocal pneumonia   . Osteopenia   . Persistent atrial fibrillation ([place])   . Stroke [facility]) 2013    Past Surgical History:  Procedure Laterality Date  . ANKLE SURGERY    . APPENDECTOMY    . CHOLECYSTECTOMY    . HERNIA REPAIR     Esophagus  . JOINT REPLACEMENT     total- right partial- left  . MASTECTOMY PARTIAL / LUMPECTOMY  2012   left    There were no vitals filed for this visit.  Subjective Assessment - 10/02/17 1012    Subjective  Patient reports that she feels like she is breathing better, she reports that she has not been out to eat in over a month due to the breathing issues.    Currently in Pain?  No/denies                      [facility] Adult PT Treatment/Exercise - 10/02/17 0001      Ambulation/Gait   Gait Comments  worked more on her using a FWW, which is what she would use to go out to eat.  3x60 feet, then one time 147feet, at 100 feet should could do no more, very short of breath and fatigued      Knee/Hip Exercises: Aerobic   Nustep  Level 5 x 9 minutes working on getting 600 steps in a  shorter period.  O2 after was 90%      Knee/Hip Exercises: Seated   Other Seated Knee/Hip Exercises  did 5# on her ankles and worked on flexion and abduction up onto 4" step, then put the 4" step in front of her and had her flex hip and extend the knee clearing the 4" step    Other Seated Knee/Hip Exercises  ball abdominal isometrics    Marching  2 sets;10 reps    Marching Limitations  3#      Knee/Hip Exercises: Supine   Other Supine Knee/Hip Exercises  use of sliding board for left knee flexion and hip abduction, also worked on left hip IR    Other Supine Knee/Hip Exercises  trunk rotaiton lower half and upper half               PT Short Term Goals - 06/05/17 1201      PT SHORT TERM GOAL #1   Title  independent with initial HEP    Status  Achieved        PT Long  Term Goals - 09/27/17 1108      PT LONG TERM GOAL #1   Title  decrease timed up and go test to 25 seconds    Status  On-going            Plan - 10/02/17 1259    Clinical Impression Statement  Patient did well with the shorter walks using the FWW, she does need cues to breath and relax her shoulders.  Her limit right now is 100 feet.  PLOF was 28'    PT Next Visit Plan  work on the gait, her endurance to tolerate activities and be healthier    Consulted and Agree with Plan of Care  Patient       Patient will benefit from skilled therapeutic intervention in order to improve the following deficits and impairments:  Abnormal gait, Cardiopulmonary status limiting activity, Decreased activity tolerance, Decreased mobility, Decreased endurance, Decreased range of motion, Decreased strength, Difficulty walking, Impaired flexibility, Impaired sensation, Improper body mechanics, Pain, Impaired UE functional use  Visit Diagnosis: Muscle weakness (generalized)  Repeated falls  Difficulty in walking, not elsewhere classified  Late effects of CVA (cerebrovascular accident)  Hemiplegia and hemiparesis  following cerebral infarction affecting left non-dominant side (HCC)  Ataxia, unspecified     Problem List Patient Active Problem List   Diagnosis Date Noted  . Respiratory failure with hypoxia ([employer]) 08/30/2017  . Exposure to influenza 08/09/2017  . Nausea without vomiting 05/10/2017  . Hypoxemia   . Heart failure with preserved ejection fraction ([place]), Grade 3 diastolic dysfunction 62/69/4854  . Atrial fibrillation with RVR ([place])   . Chronic diastolic CHF (congestive heart failure) ([place])   . PAF (paroxysmal atrial fibrillation) ([place])   . History of stroke   . Fever   . Metabolic syndrome 62/70/3500  . Encounter for preventive health examination 02/17/2016  . Morbid obesity ([place]) 06/17/2015  . Fall at home 12/01/2014  . Hypomagnesemia 04/24/2014  . Hemiparesis affecting left side as late effect of cerebrovascular accident ([place]) 04/24/2014  . Nontraumatic cerebral hemorrhage ([place]) 04/30/2012  . Diabetes type 2, controlled ([place]) 03/04/2010  . OSTEOPENIA 01/21/2009  . UNSPECIFIED VITAMIN D DEFICIENCY 11/19/2007  . ESSENTIAL HYPERTENSION, BENIGN 11/19/2007  . HYPERCHOLESTEROLEMIA 10/25/2006  . GASTROESOPHAGEAL REFLUX, NO ESOPHAGITIS 10/25/2006  . DIVERTICULOSIS OF COLON 10/25/2006  . Osteoarthritis 10/25/2006  . CERVICAL SPINE DISORDER, NOS 10/25/2006    Sumner Boast., PT 10/02/2017, 1:01 PM  [address] [place], [place], [address] Phone: [phone]   Fax:  [phone]  Name: Karen Jackson MRN: [id number] Date of Birth: [birth year]

## 2017-10-04 ENCOUNTER — Ambulatory Visit: Payer: Medicare Other | Admitting: Physical Therapy

## 2017-10-04 ENCOUNTER — Other Ambulatory Visit: Payer: Self-pay | Admitting: Family Medicine

## 2017-10-04 ENCOUNTER — Encounter: Payer: Self-pay | Admitting: Physical Therapy

## 2017-10-04 DIAGNOSIS — R296 Repeated falls: Secondary | ICD-10-CM

## 2017-10-04 DIAGNOSIS — I6990 Unspecified sequelae of unspecified cerebrovascular disease: Secondary | ICD-10-CM | POA: Diagnosis not present

## 2017-10-04 DIAGNOSIS — M6281 Muscle weakness (generalized): Secondary | ICD-10-CM

## 2017-10-04 DIAGNOSIS — R262 Difficulty in walking, not elsewhere classified: Secondary | ICD-10-CM

## 2017-10-04 DIAGNOSIS — I69354 Hemiplegia and hemiparesis following cerebral infarction affecting left non-dominant side: Secondary | ICD-10-CM | POA: Diagnosis not present

## 2017-10-04 DIAGNOSIS — R270 Ataxia, unspecified: Secondary | ICD-10-CM

## 2017-10-04 NOTE — Therapy (Signed)
[address] [place], [place], [address] Phone: [phone]   Fax:  [phone]  Physical Therapy Treatment  Patient Details  Name: Karen Jackson MRN: [id number] Date of Birth: 1941-02-03 Referring Provider: Andria Frames   Encounter Date: 10/04/2017  PT End of Session - 10/04/17 1301    Visit Number  32    Date for PT Re-Evaluation  10/22/17    PT Start Time  1055    PT Stop Time  1145    PT Time Calculation (min)  50 min    Activity Tolerance  Patient limited by fatigue    Behavior During Therapy  [facility] for tasks assessed/performed       Past Medical History:  Diagnosis Date  . Acute cystitis without hematuria   . Acute diastolic CHF (congestive heart failure) ([place])   . Arthritis   . Dyspnea   . Fever of unknown origin 03/19/2017  . Hyperlipidemia   . Hypertension   . Multifocal pneumonia   . Osteopenia   . Persistent atrial fibrillation ([place])   . Stroke [facility]) 2013    Past Surgical History:  Procedure Laterality Date  . ANKLE SURGERY    . APPENDECTOMY    . CHOLECYSTECTOMY    . HERNIA REPAIR     Esophagus  . JOINT REPLACEMENT     total- right partial- left  . MASTECTOMY PARTIAL / LUMPECTOMY  2012   left    There were no vitals filed for this visit.  Subjective Assessment - 10/04/17 1256    Subjective  Patient reports that she is a little more SOB today.    Currently in Pain?  No/denies                      [facility] Adult PT Treatment/Exercise - 10/04/17 0001      Ambulation/Gait   Gait Comments  use of the FWW to simulate her going out to a resaurant, 60'x2, then tried HHA x 110 feet, this was tha absolute max she could do, needed a rest break standing and then was done and could not do any more, continues to need cues to relax shoulder and to take bigger steps      Knee/Hip Exercises: Aerobic   Nustep  Level 5 x 9 minutes working on getting 600 steps in a shorter period.  O2  after was 93%      Knee/Hip Exercises: Standing   Other Standing Knee Exercises  4" toe clears, really needs close gaurding as she tends to not pay attention to the left leg and it buckle and she sits quickly, used 3# on the ankles for this      Knee/Hip Exercises: Seated   Long Arc Quad  20 reps;Weights;Both    Long Arc Quad Weight  5 lbs.    Other Seated Knee/Hip Exercises  did 5# on her ankles and worked on flexion and abduction up onto 4" step, then put the 4" step in front of her and had her flex hip and extend the knee clearing the 4" step    Other Seated Knee/Hip Exercises  Seated PWR moves to help with trunk motions    Marching  2 sets;10 reps    Marching Limitations  3#               PT Short Term Goals - 06/05/17 1201      PT SHORT TERM GOAL #1   Title  independent with initial HEP    Status  Achieved        PT Long Term Goals - 10/04/17 1305      PT LONG TERM GOAL #1   Title  decrease timed up and go test to 25 seconds    Status  On-going      PT LONG TERM GOAL #2   Title  increased right LE strength to 4-/5    Status  On-going      PT LONG TERM GOAL #3   Title  walk 200 feet without rest using FWW    Status  On-going            Plan - 10/04/17 1302    Clinical Impression Statement  Patient continues to need cues to relax shoulder during gait with the FWW, today we tried to increase her ambulation distance and 110 feet was her absolute max, her O2 had been running about 94% at rest but after this she was at 85%.  All exercises involvong the left leg require cues to get a good contraction, do the full motions and do it fully    PT Next Visit Plan  work on the gait, her endurance to tolerate activities and be healthier    Consulted and Agree with Plan of Care  Patient       Patient will benefit from skilled therapeutic intervention in order to improve the following deficits and impairments:  Abnormal gait, Cardiopulmonary status limiting activity,  Decreased activity tolerance, Decreased mobility, Decreased endurance, Decreased range of motion, Decreased strength, Difficulty walking, Impaired flexibility, Impaired sensation, Improper body mechanics, Pain, Impaired UE functional use  Visit Diagnosis: Muscle weakness (generalized)  Repeated falls  Difficulty in walking, not elsewhere classified  Late effects of CVA (cerebrovascular accident)  Hemiplegia and hemiparesis following cerebral infarction affecting left non-dominant side (HCC)  Ataxia, unspecified     Problem List Patient Active Problem List   Diagnosis Date Noted  . Respiratory failure with hypoxia ([place]) 08/30/2017  . Exposure to influenza 08/09/2017  . Nausea without vomiting 05/10/2017  . Hypoxemia   . Heart failure with preserved ejection fraction ([place]), Grade 3 diastolic dysfunction 40/81/4481  . Atrial fibrillation with RVR ([place])   . Chronic diastolic CHF (congestive heart failure) ([place])   . PAF (paroxysmal atrial fibrillation) ([place])   . History of stroke   . Fever   . Metabolic syndrome 85/63/1497  . Encounter for preventive health examination 02/17/2016  . Morbid obesity ([place]) 06/17/2015  . Fall at home 12/01/2014  . Hypomagnesemia 04/24/2014  . Hemiparesis affecting left side as late effect of cerebrovascular accident ([place]) 04/24/2014  . Nontraumatic cerebral hemorrhage ([place]) 04/30/2012  . Diabetes type 2, controlled ([place]) 03/04/2010  . OSTEOPENIA 01/21/2009  . UNSPECIFIED VITAMIN D DEFICIENCY 11/19/2007  . ESSENTIAL HYPERTENSION, BENIGN 11/19/2007  . HYPERCHOLESTEROLEMIA 10/25/2006  . GASTROESOPHAGEAL REFLUX, NO ESOPHAGITIS 10/25/2006  . DIVERTICULOSIS OF COLON 10/25/2006  . Osteoarthritis 10/25/2006  . CERVICAL SPINE DISORDER, NOS 10/25/2006    Karen Jackson., PT 10/04/2017, 1:06 PM  [address] [place], [place], [address] Phone: [phone]   Fax:   [phone]  Name: Karen Jackson MRN: [id number] Date of Birth: 08/06/1941

## 2017-10-05 ENCOUNTER — Other Ambulatory Visit: Payer: Self-pay | Admitting: Family Medicine

## 2017-10-05 DIAGNOSIS — M150 Primary generalized (osteo)arthritis: Principal | ICD-10-CM

## 2017-10-05 DIAGNOSIS — M159 Polyosteoarthritis, unspecified: Secondary | ICD-10-CM

## 2017-10-05 IMAGING — DX DG CHEST 2V
2 series · 2 of 2 positions shown · non-contrast
Comparison: Chest radiograph March 27, 2017

CLINICAL DATA: Followup pneumonia and CHF.  Shortness of breath.

EXAM:
CHEST  2 VIEW

[view not recorded]
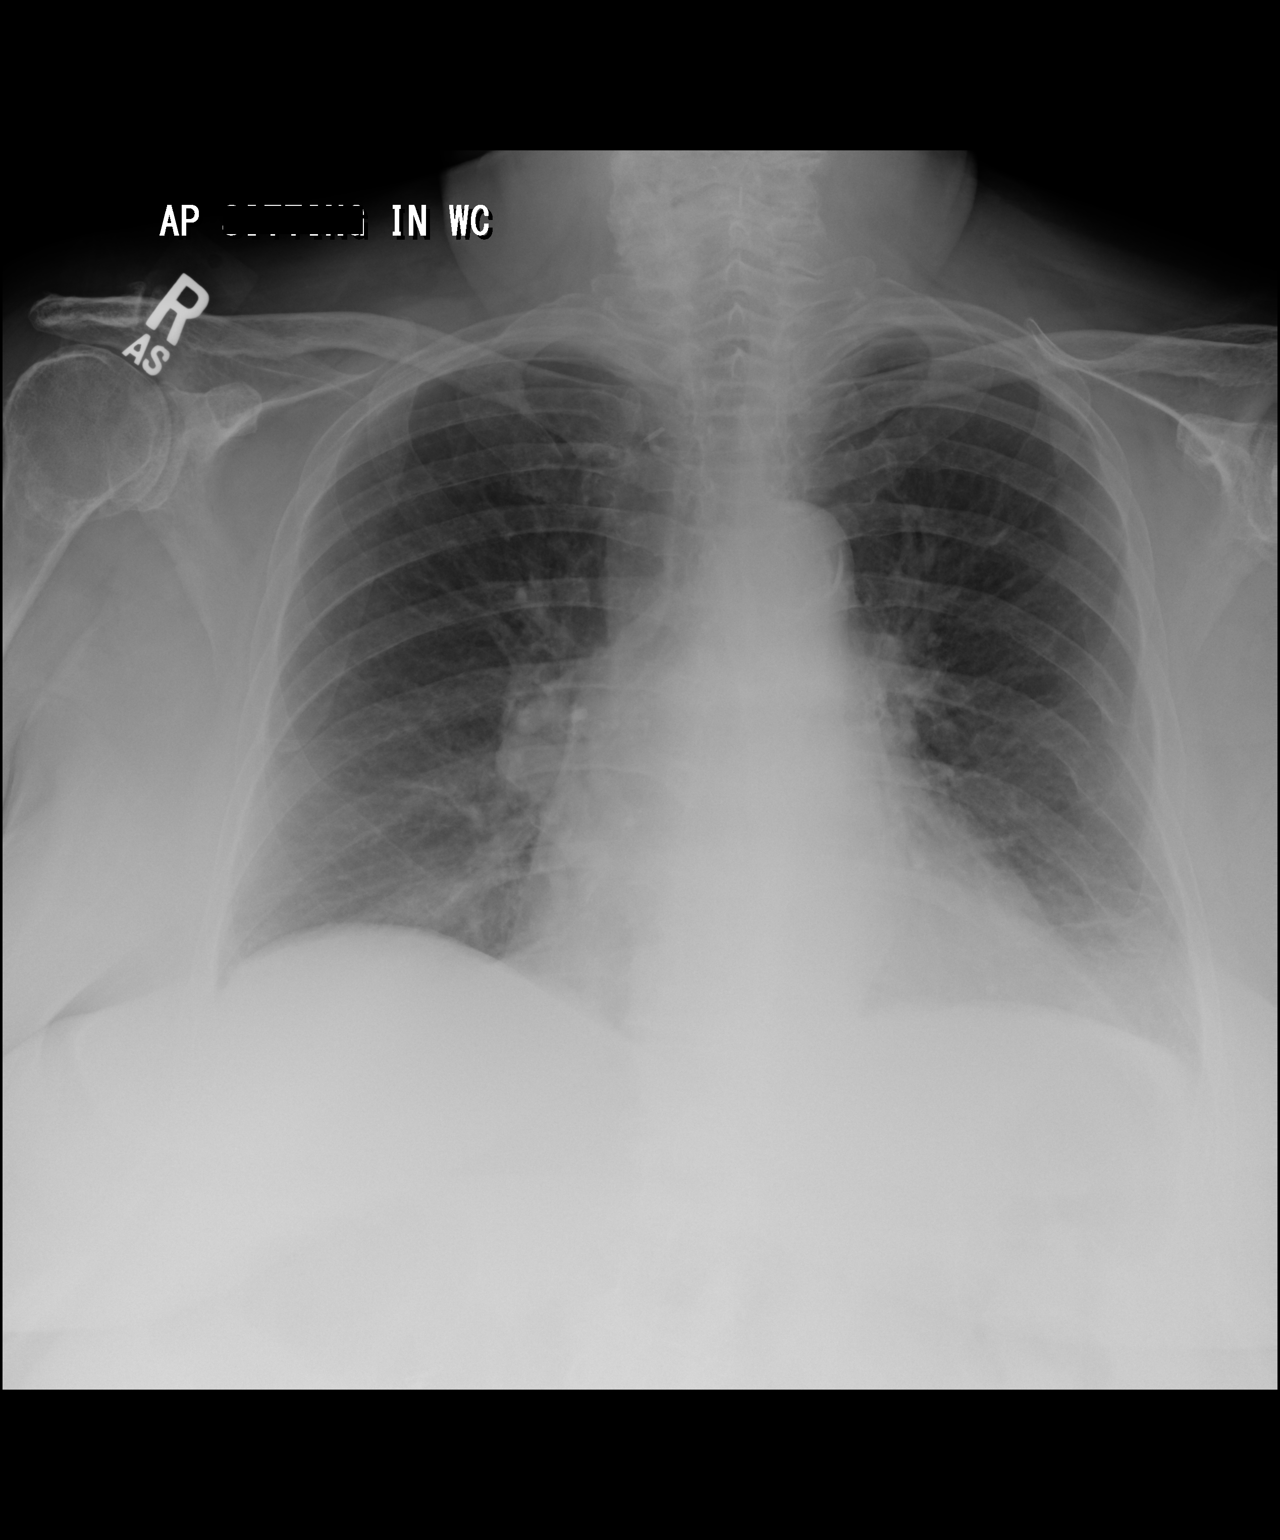

[dg chest 2 view]
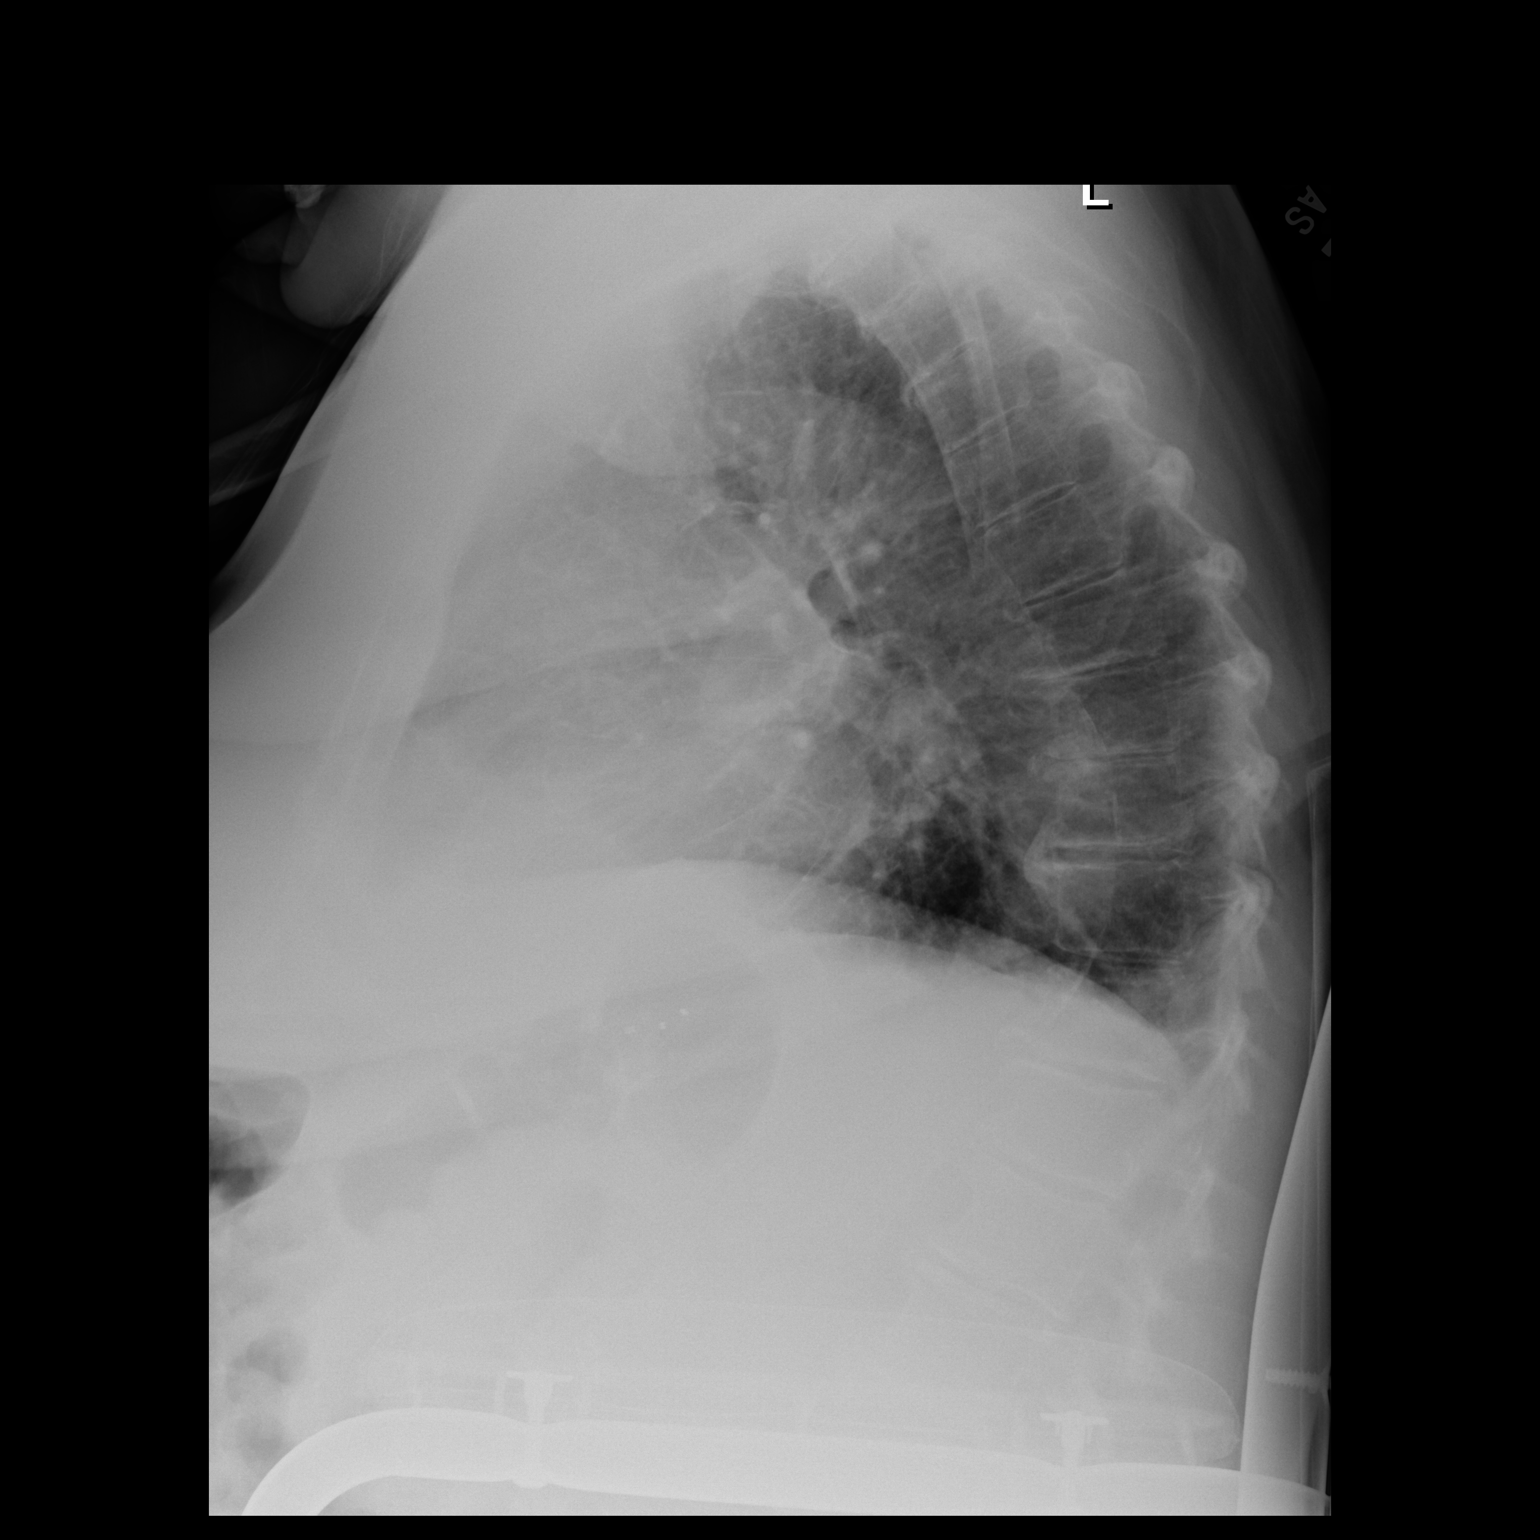

[2 of 2 positions shown; findings below may reference images not displayed]

FINDINGS: Cardiac silhouette is mildly enlarged. Calcified aortic knob. No
pleural effusion or focal consolidation. Pulmonary vasculature
appears normal, improved from prior radiograph. Strandy densities
LEFT lung base. No pneumothorax. Soft tissue planes and included
osseous structures are nonsuspicious. Old LEFT rib fractures.
IMPRESSION: Mild cardiomegaly.  LEFT lung base atelectasis/ scarring.

Aortic Atherosclerosis ([id number]).

## 2017-10-09 ENCOUNTER — Encounter: Payer: Self-pay | Admitting: Physical Therapy

## 2017-10-09 ENCOUNTER — Ambulatory Visit: Payer: Medicare Other | Admitting: Physical Therapy

## 2017-10-09 DIAGNOSIS — I69354 Hemiplegia and hemiparesis following cerebral infarction affecting left non-dominant side: Secondary | ICD-10-CM

## 2017-10-09 DIAGNOSIS — R296 Repeated falls: Secondary | ICD-10-CM | POA: Diagnosis not present

## 2017-10-09 DIAGNOSIS — M6281 Muscle weakness (generalized): Secondary | ICD-10-CM

## 2017-10-09 DIAGNOSIS — I6990 Unspecified sequelae of unspecified cerebrovascular disease: Secondary | ICD-10-CM

## 2017-10-09 DIAGNOSIS — R262 Difficulty in walking, not elsewhere classified: Secondary | ICD-10-CM | POA: Diagnosis not present

## 2017-10-09 DIAGNOSIS — R270 Ataxia, unspecified: Secondary | ICD-10-CM | POA: Diagnosis not present

## 2017-10-09 NOTE — Therapy (Signed)
[address] [place], [place], [address] Phone: [phone]   Fax:  [phone]  Physical Therapy Treatment  Patient Details  Name: Karen Jackson MRN: [id number] Date of Birth: Jun 20, 1941 Referring Provider: Andria Frames   Encounter Date: 10/09/2017  PT End of Session - 10/09/17 1108    Visit Number  33    Date for PT Re-Evaluation  10/22/17    PT Start Time  1008    PT Stop Time  1055    PT Time Calculation (min)  47 min    Activity Tolerance  Patient limited by fatigue    Behavior During Therapy  [facility] for tasks assessed/performed       Past Medical History:  Diagnosis Date  . Acute cystitis without hematuria   . Acute diastolic CHF (congestive heart failure) ([place])   . Arthritis   . Dyspnea   . Fever of unknown origin 03/19/2017  . Hyperlipidemia   . Hypertension   . Multifocal pneumonia   . Osteopenia   . Persistent atrial fibrillation ([place])   . Stroke [facility]) 2013    Past Surgical History:  Procedure Laterality Date  . ANKLE SURGERY    . APPENDECTOMY    . CHOLECYSTECTOMY    . HERNIA REPAIR     Esophagus  . JOINT REPLACEMENT     total- right partial- left  . MASTECTOMY PARTIAL / LUMPECTOMY  2012   left    There were no vitals filed for this visit.  Subjective Assessment - 10/09/17 1012    Subjective  Patient reports that she has had some issues again this weekend with her breathing.  She reports no falls or stumbles    Currently in Pain?  No/denies                      [facility] Adult PT Treatment/Exercise - 10/09/17 0001      Ambulation/Gait   Gait Comments  FWW x 70 feet, had a lot of trouble with advancing the left foot, seemed to keep sticking on the ground.  Then did 100 feet HHA cues for picking up foot and to relax shoulders      Knee/Hip Exercises: Aerobic   Nustep  Level 5 x 9.5 minutes working on getting 600 steps in a shorter period.  O2 after was 93%, much slower  pace today, has been able to get 600 steps in 8.25 minutes      Knee/Hip Exercises: Machines for Strengthening   Other Machine  yellow and red tband DF left ankle, foot on sit fit working on [employer]      Knee/Hip Exercises: Standing   Knee Flexion  2 sets;10 reps    Knee Flexion Limitations  2#    Other Standing Knee Exercises  4" picking up left foot and placing it on the step and then taking it off, this was very difficult for her and needed assist, had to break it down into sets of 5 reps due to fatigue and frustration      Knee/Hip Exercises: Seated   Other Seated Knee/Hip Exercises  did 1# on her ankles and worked on flexion and abduction up onto 4" step, then put the 4" step in front of her and had her flex hip and extend the knee clearing the 4" step, much more difficult with the left leg today    Other Seated Knee/Hip Exercises  Seated PWR moves to help with trunk  motions               PT Short Term Goals - 06/05/17 1201      PT SHORT TERM GOAL #1   Title  independent with initial HEP    Status  Achieved        PT Long Term Goals - 10/04/17 1305      PT LONG TERM GOAL #1   Title  decrease timed up and go test to 25 seconds    Status  On-going      PT LONG TERM GOAL #2   Title  increased right LE strength to 4-/5    Status  On-going      PT LONG TERM GOAL #3   Title  walk 200 feet without rest using FWW    Status  On-going            Plan - 10/09/17 1109    Clinical Impression Statement  Overall her O2 saturation is much improved even with walking.  She had a lot more difficulty today with picking up the left foot to advance it, it tended to get stuck on the floor causing her to hesitate, and having difficulty advancing the foot.    PT Next Visit Plan  patient c/o doing poorly due to the weather, cold and wet, assure that she improves in her ability to walk    Consulted and Agree with Plan of Care  Patient       Patient will benefit from skilled  therapeutic intervention in order to improve the following deficits and impairments:  Abnormal gait, Cardiopulmonary status limiting activity, Decreased activity tolerance, Decreased mobility, Decreased endurance, Decreased range of motion, Decreased strength, Difficulty walking, Impaired flexibility, Impaired sensation, Improper body mechanics, Pain, Impaired UE functional use  Visit Diagnosis: Muscle weakness (generalized)  Repeated falls  Difficulty in walking, not elsewhere classified  Late effects of CVA (cerebrovascular accident)  Hemiplegia and hemiparesis following cerebral infarction affecting left non-dominant side (HCC)  Ataxia, unspecified     Problem List Patient Active Problem List   Diagnosis Date Noted  . Respiratory failure with hypoxia ([place]) 08/30/2017  . Exposure to influenza 08/09/2017  . Nausea without vomiting 05/10/2017  . Hypoxemia   . Heart failure with preserved ejection fraction ([place]), Grade 3 diastolic dysfunction 77/82/4235  . Atrial fibrillation with RVR ([place])   . Chronic diastolic CHF (congestive heart failure) ([place])   . PAF (paroxysmal atrial fibrillation) ([place])   . History of stroke   . Fever   . Metabolic syndrome 36/14/4315  . Encounter for preventive health examination 02/17/2016  . Morbid obesity ([place]) 06/17/2015  . Fall at home 12/01/2014  . Hypomagnesemia 04/24/2014  . Hemiparesis affecting left side as late effect of cerebrovascular accident ([place]) 04/24/2014  . Nontraumatic cerebral hemorrhage ([place]) 04/30/2012  . Diabetes type 2, controlled ([place]) 03/04/2010  . OSTEOPENIA 01/21/2009  . UNSPECIFIED VITAMIN D DEFICIENCY 11/19/2007  . ESSENTIAL HYPERTENSION, BENIGN 11/19/2007  . HYPERCHOLESTEROLEMIA 10/25/2006  . GASTROESOPHAGEAL REFLUX, NO ESOPHAGITIS 10/25/2006  . DIVERTICULOSIS OF COLON 10/25/2006  . Osteoarthritis 10/25/2006  . CERVICAL SPINE DISORDER, NOS 10/25/2006    Sumner Boast., PT 10/09/2017, 11:12 AM  [address] [place], [place], [address] Phone: [phone]   Fax:  [phone]  Name: Karen Jackson MRN: [id number] Date of Birth: 11-Jan-1941

## 2017-10-10 DIAGNOSIS — H26492 Other secondary cataract, left eye: Secondary | ICD-10-CM | POA: Diagnosis not present

## 2017-10-10 DIAGNOSIS — H5213 Myopia, bilateral: Secondary | ICD-10-CM | POA: Diagnosis not present

## 2017-10-10 DIAGNOSIS — Z961 Presence of intraocular lens: Secondary | ICD-10-CM | POA: Diagnosis not present

## 2017-10-10 LAB — HM DIABETES EYE EXAM

## 2017-10-11 ENCOUNTER — Telehealth: Payer: Self-pay

## 2017-10-11 ENCOUNTER — Encounter: Payer: Self-pay | Admitting: Physical Therapy

## 2017-10-11 ENCOUNTER — Other Ambulatory Visit: Payer: Self-pay | Admitting: Family Medicine

## 2017-10-11 ENCOUNTER — Ambulatory Visit: Payer: Medicare Other | Admitting: Physical Therapy

## 2017-10-11 DIAGNOSIS — I69354 Hemiplegia and hemiparesis following cerebral infarction affecting left non-dominant side: Secondary | ICD-10-CM | POA: Diagnosis not present

## 2017-10-11 DIAGNOSIS — R262 Difficulty in walking, not elsewhere classified: Secondary | ICD-10-CM

## 2017-10-11 DIAGNOSIS — R296 Repeated falls: Secondary | ICD-10-CM | POA: Diagnosis not present

## 2017-10-11 DIAGNOSIS — I5030 Unspecified diastolic (congestive) heart failure: Secondary | ICD-10-CM

## 2017-10-11 DIAGNOSIS — I6990 Unspecified sequelae of unspecified cerebrovascular disease: Secondary | ICD-10-CM | POA: Diagnosis not present

## 2017-10-11 DIAGNOSIS — M6281 Muscle weakness (generalized): Secondary | ICD-10-CM

## 2017-10-11 DIAGNOSIS — R270 Ataxia, unspecified: Secondary | ICD-10-CM | POA: Diagnosis not present

## 2017-10-11 DIAGNOSIS — R252 Cramp and spasm: Secondary | ICD-10-CM

## 2017-10-11 NOTE — Therapy (Signed)
[address] [place], [place], [address] Phone: [phone]   Fax:  [phone]  Physical Therapy Treatment  Patient Details  Name: Karen Jackson MRN: [id number] Date of Birth: 1941-04-23 Referring Provider: Andria Frames   Encounter Date: 10/11/2017  PT End of Session - 10/11/17 1111    Visit Number  34    Date for PT Re-Evaluation  10/22/17    PT Start Time  1001    PT Stop Time  1050    PT Time Calculation (min)  49 min    Activity Tolerance  Patient limited by fatigue    Behavior During Therapy  [facility] for tasks assessed/performed       Past Medical History:  Diagnosis Date  . Acute cystitis without hematuria   . Acute diastolic CHF (congestive heart failure) ([place])   . Arthritis   . Dyspnea   . Fever of unknown origin 03/19/2017  . Hyperlipidemia   . Hypertension   . Multifocal pneumonia   . Osteopenia   . Persistent atrial fibrillation ([place])   . Stroke [facility]) 2013    Past Surgical History:  Procedure Laterality Date  . ANKLE SURGERY    . APPENDECTOMY    . CHOLECYSTECTOMY    . HERNIA REPAIR     Esophagus  . JOINT REPLACEMENT     total- right partial- left  . MASTECTOMY PARTIAL / LUMPECTOMY  2012   left    There were no vitals filed for this visit.  Subjective Assessment - 10/11/17 1105    Subjective  Patient reports that "Tuesday was a rough day, my leg felt like it weight 500#",  She reports that she is moving better today    Currently in Pain?  Yes    Pain Score  7     Pain Location  Neck    Pain Orientation  Left    Pain Descriptors / Indicators  Tightness;Spasm    Aggravating Factors   I have noticed recently that she is really elevating her shoulders when she walks                      [facility] Adult PT Treatment/Exercise - 10/11/17 0001      Ambulation/Gait   Gait Comments  70" HHA walking, some cues to pick up left foot, 60' FWW cues to relax shoulders, at end of  treatment 120 ' with HHA, a lot of cues to relax and pick up feet, required 3 stops to catch her breath.      Knee/Hip Exercises: Aerobic   Nustep  Level 5 x 8 mins 20 seconds at 600 steps      Knee/Hip Exercises: Machines for Strengthening   Other Machine  yellow and red tband DF left ankle, foot on sit fit working on [employer]      Knee/Hip Exercises: Standing   Knee Flexion  Both;3 sets;10 reps    Knee Flexion Limitations  4#    Hip Abduction  Both;3 sets;10 reps    Abduction Limitations  4#    Other Standing Knee Exercises  4" picking up left foot and placing it on the step and then taking it off, this was very difficult for her and needed assist, had to break it down into sets of 5 reps due to fatigue and frustration      Knee/Hip Exercises: Seated   Long Arc Quad  Both;3 sets;10 reps    Long Arc  Quad Weight  4 lbs.    Other Seated Knee/Hip Exercises  did 4# on her ankles and worked on flexion and abduction up onto 4" step, then put the 4" step in front of her and had her flex hip and extend the knee clearing the 4" step, much more difficult with the left leg today    Marching  Both;3 sets;10 reps    Marching Limitations  4#      Manual Therapy   Manual Therapy  Soft tissue mobilization    Soft tissue mobilization  has a significant trigger point in the left upper trap area, that is causing her neck pain               PT Short Term Goals - 06/05/17 1201      PT SHORT TERM GOAL #1   Title  independent with initial HEP    Status  Achieved        PT Long Term Goals - 10/11/17 1113      PT LONG TERM GOAL #1   Title  decrease timed up and go test to 25 seconds    Status  On-going            Plan - 10/11/17 1111    Clinical Impression Statement  Today patient did better with the left foot pick up, she reports that she feels that the weather was the issue.  She was able to walk farther today but required standing rest breaks to catch her breath, she is slowly  progressing toward her goal of being able to go out to eat with friends and being able to walk into the restaurant    PT Next Visit Plan  continue to advance her walking and function so she can return to her PLOF    Consulted and Agree with Plan of Care  Patient       Patient will benefit from skilled therapeutic intervention in order to improve the following deficits and impairments:  Abnormal gait, Cardiopulmonary status limiting activity, Decreased activity tolerance, Decreased mobility, Decreased endurance, Decreased range of motion, Decreased strength, Difficulty walking, Impaired flexibility, Impaired sensation, Improper body mechanics, Pain, Impaired UE functional use  Visit Diagnosis: Muscle weakness (generalized)  Repeated falls  Difficulty in walking, not elsewhere classified  Late effects of CVA (cerebrovascular accident)  Hemiplegia and hemiparesis following cerebral infarction affecting left non-dominant side (HCC)  Ataxia, unspecified  Cramp and spasm     Problem List Patient Active Problem List   Diagnosis Date Noted  . Respiratory failure with hypoxia ([place]) 08/30/2017  . Exposure to influenza 08/09/2017  . Nausea without vomiting 05/10/2017  . Hypoxemia   . Heart failure with preserved ejection fraction ([place]), Grade 3 diastolic dysfunction 87/68/1157  . Atrial fibrillation with RVR ([place])   . Chronic diastolic CHF (congestive heart failure) ([place])   . PAF (paroxysmal atrial fibrillation) ([place])   . History of stroke   . Fever   . Metabolic syndrome 26/20/3559  . Encounter for preventive health examination 02/17/2016  . Morbid obesity ([place]) 06/17/2015  . Fall at home 12/01/2014  . Hypomagnesemia 04/24/2014  . Hemiparesis affecting left side as late effect of cerebrovascular accident ([place]) 04/24/2014  . Nontraumatic cerebral hemorrhage ([place]) 04/30/2012  . Diabetes type 2, controlled ([place]) 03/04/2010  . OSTEOPENIA 01/21/2009  . UNSPECIFIED VITAMIN D  DEFICIENCY 11/19/2007  . ESSENTIAL HYPERTENSION, BENIGN 11/19/2007  . HYPERCHOLESTEROLEMIA 10/25/2006  . GASTROESOPHAGEAL REFLUX, NO ESOPHAGITIS 10/25/2006  . DIVERTICULOSIS OF COLON 10/25/2006  .  Osteoarthritis 10/25/2006  . CERVICAL SPINE DISORDER, NOS 10/25/2006    Sumner Boast., PT 10/11/2017, 11:14 AM  [address] [place], [place], [address] Phone: [phone]   Fax:  [phone]  Name: Karen Jackson MRN: [id number] Date of Birth: 1940/09/12

## 2017-10-16 ENCOUNTER — Ambulatory Visit: Payer: Medicare Other | Admitting: Physical Therapy

## 2017-10-16 ENCOUNTER — Encounter: Payer: Self-pay | Admitting: Physical Therapy

## 2017-10-16 DIAGNOSIS — I69354 Hemiplegia and hemiparesis following cerebral infarction affecting left non-dominant side: Secondary | ICD-10-CM

## 2017-10-16 DIAGNOSIS — R262 Difficulty in walking, not elsewhere classified: Secondary | ICD-10-CM

## 2017-10-16 DIAGNOSIS — R252 Cramp and spasm: Secondary | ICD-10-CM

## 2017-10-16 DIAGNOSIS — M6281 Muscle weakness (generalized): Secondary | ICD-10-CM

## 2017-10-16 DIAGNOSIS — R296 Repeated falls: Secondary | ICD-10-CM | POA: Diagnosis not present

## 2017-10-16 DIAGNOSIS — R270 Ataxia, unspecified: Secondary | ICD-10-CM | POA: Diagnosis not present

## 2017-10-16 DIAGNOSIS — I6990 Unspecified sequelae of unspecified cerebrovascular disease: Secondary | ICD-10-CM | POA: Diagnosis not present

## 2017-10-16 NOTE — Therapy (Signed)
[place] [address] [place], [place], [address] Phone: [phone]   Fax:  [phone]  Physical Therapy Treatment  Patient Details  Name: Karen Jackson MRN: [id number] Date of Birth: 17-Jul-1941 Referring Provider: Andria Frames   Encounter Date: 10/16/2017  PT End of Session - 10/16/17 1424    Visit Number  35    Date for PT Re-Evaluation  10/22/17    PT Start Time  1001    PT Stop Time  1051    PT Time Calculation (min)  50 min    Activity Tolerance  Patient limited by fatigue;Patient limited by pain    Behavior During Therapy  [facility] for tasks assessed/performed       Past Medical History:  Diagnosis Date  . Acute cystitis without hematuria   . Acute diastolic CHF (congestive heart failure) ([place])   . Arthritis   . Dyspnea   . Fever of unknown origin 03/19/2017  . Hyperlipidemia   . Hypertension   . Multifocal pneumonia   . Osteopenia   . Persistent atrial fibrillation ([place])   . Stroke [facility]) 2013    Past Surgical History:  Procedure Laterality Date  . ANKLE SURGERY    . APPENDECTOMY    . CHOLECYSTECTOMY    . HERNIA REPAIR     Esophagus  . JOINT REPLACEMENT     total- right partial- left  . MASTECTOMY PARTIAL / LUMPECTOMY  2012   left    There were no vitals filed for this visit.  Subjective Assessment - 10/16/17 1356    Subjective  Patient reports that she did well over the weekend.  Reports that the left shoulder really bothers her with wet cold weather    Currently in Pain?  Yes    Pain Score  6     Pain Location  Shoulder    Pain Orientation  Left    Aggravating Factors   cold wet weather                      [facility] Adult PT Treatment/Exercise - 10/16/17 0001      Ambulation/Gait   Gait Comments  FWW x 60 feet, cues to relax shoulders and take a big step, after PT ambulation with HHA, first rest was at 70 feet, then 30 feet and then 15 feet, all rests were in standing, a lot of  popping in the shoulders      Knee/Hip Exercises: Aerobic   Nustep  Level 5 x 9 minutes      Knee/Hip Exercises: Machines for Strengthening   Other Machine  red tband ankle DF 3x10 reps      Knee/Hip Exercises: Supine   Bridges with Ball Squeeze  3 sets;10 reps    Other Supine Knee/Hip Exercises  use of sliding board for left knee flexion and hip abduction, also worked on left hip IR    Other Supine Knee/Hip Exercises  trunk rotaiton lower half and upper half, small crunches      Manual Therapy   Manual Therapy  Soft tissue mobilization    Manual therapy comments  PROM of the left shoulder, approximation of the fingers with motions to stimulate increased function    Soft tissue mobilization  has a significant trigger point in the left upper trap area, that is causing her neck pain               PT Short Term Goals -  06/05/17 1201      PT SHORT TERM GOAL #1   Title  independent with initial HEP    Status  Achieved        PT Long Term Goals - 10/11/17 1113      PT LONG TERM GOAL #1   Title  decrease timed up and go test to 25 seconds    Status  On-going            Plan - 10/16/17 1424    Clinical Impression Statement  Patient continues to have her ups and downs and lately is having a lot more left upper trap pain, she seems to have a great deal of elevating her shoulders and I feel a lot of crepitus in the shoulers when she is walking.  Her initial walk is 70 feet then any subsequent walking is much more limited, she would like to be able to walk into a place to eat with a walker, this weekend she went but was pushed in a wheelchair     PT Next Visit Plan  continue to advance her walking and function so she can return to her PLOF    Consulted and Agree with Plan of Care  Patient       Patient will benefit from skilled therapeutic intervention in order to improve the following deficits and impairments:  Abnormal gait, Cardiopulmonary status limiting activity,  Decreased activity tolerance, Decreased mobility, Decreased endurance, Decreased range of motion, Decreased strength, Difficulty walking, Impaired flexibility, Impaired sensation, Improper body mechanics, Pain, Impaired UE functional use  Visit Diagnosis: Muscle weakness (generalized)  Repeated falls  Difficulty in walking, not elsewhere classified  Late effects of CVA (cerebrovascular accident)  Hemiplegia and hemiparesis following cerebral infarction affecting left non-dominant side (HCC)  Ataxia, unspecified  Cramp and spasm     Problem List Patient Active Problem List   Diagnosis Date Noted  . Respiratory failure with hypoxia ([place]) 08/30/2017  . Exposure to influenza 08/09/2017  . Nausea without vomiting 05/10/2017  . Hypoxemia   . Heart failure with preserved ejection fraction ([employer]), Grade 3 diastolic dysfunction 43/15/4008  . Atrial fibrillation with RVR ([place])   . Chronic diastolic CHF (congestive heart failure) ([place])   . PAF (paroxysmal atrial fibrillation) ([place])   . History of stroke   . Fever   . Metabolic syndrome 67/61/9509  . Encounter for preventive health examination 02/17/2016  . Morbid obesity ([place]) 06/17/2015  . Fall at home 12/01/2014  . Hypomagnesemia 04/24/2014  . Hemiparesis affecting left side as late effect of cerebrovascular accident ([place]) 04/24/2014  . Nontraumatic cerebral hemorrhage ([place]) 04/30/2012  . Diabetes type 2, controlled ([place]) 03/04/2010  . OSTEOPENIA 01/21/2009  . UNSPECIFIED VITAMIN D DEFICIENCY 11/19/2007  . ESSENTIAL HYPERTENSION, BENIGN 11/19/2007  . HYPERCHOLESTEROLEMIA 10/25/2006  . GASTROESOPHAGEAL REFLUX, NO ESOPHAGITIS 10/25/2006  . DIVERTICULOSIS OF COLON 10/25/2006  . Osteoarthritis 10/25/2006  . CERVICAL SPINE DISORDER, NOS 10/25/2006    Sumner Boast., PT 10/16/2017, 2:27 PM  [address] [place], [place], [address] Phone: [phone]    Fax:  [phone]  Name: Karen Jackson MRN: [id number] Date of Birth: 02-05-1941

## 2017-10-19 ENCOUNTER — Encounter: Payer: Self-pay | Admitting: Physical Therapy

## 2017-10-19 ENCOUNTER — Ambulatory Visit: Payer: Medicare Other | Admitting: Physical Therapy

## 2017-10-19 DIAGNOSIS — M6281 Muscle weakness (generalized): Secondary | ICD-10-CM

## 2017-10-19 DIAGNOSIS — R262 Difficulty in walking, not elsewhere classified: Secondary | ICD-10-CM | POA: Diagnosis not present

## 2017-10-19 DIAGNOSIS — I6990 Unspecified sequelae of unspecified cerebrovascular disease: Secondary | ICD-10-CM | POA: Diagnosis not present

## 2017-10-19 DIAGNOSIS — R270 Ataxia, unspecified: Secondary | ICD-10-CM | POA: Diagnosis not present

## 2017-10-19 DIAGNOSIS — I69354 Hemiplegia and hemiparesis following cerebral infarction affecting left non-dominant side: Secondary | ICD-10-CM

## 2017-10-19 DIAGNOSIS — R296 Repeated falls: Secondary | ICD-10-CM | POA: Diagnosis not present

## 2017-10-19 NOTE — Therapy (Signed)
[address] [place], [place], [address] Phone: [phone]   Fax:  [phone]  Physical Therapy Treatment  Patient Details  Name: Karen Jackson MRN: [id number] Date of Birth: 1941/07/14 Referring Provider: Andria Frames   Encounter Date: 10/19/2017  PT End of Session - 10/19/17 1111    Visit Number  36    Date for PT Re-Evaluation  10/22/17    PT Start Time  1006    PT Stop Time  1050    PT Time Calculation (min)  44 min    Activity Tolerance  Patient tolerated treatment well    Behavior During Therapy  [facility] for tasks assessed/performed       Past Medical History:  Diagnosis Date  . Acute cystitis without hematuria   . Acute diastolic CHF (congestive heart failure) ([place])   . Arthritis   . Dyspnea   . Fever of unknown origin 03/19/2017  . Hyperlipidemia   . Hypertension   . Multifocal pneumonia   . Osteopenia   . Persistent atrial fibrillation ([place])   . Stroke [facility]) 2013    Past Surgical History:  Procedure Laterality Date  . ANKLE SURGERY    . APPENDECTOMY    . CHOLECYSTECTOMY    . HERNIA REPAIR     Esophagus  . JOINT REPLACEMENT     total- right partial- left  . MASTECTOMY PARTIAL / LUMPECTOMY  2012   left    There were no vitals filed for this visit.  Subjective Assessment - 10/19/17 1046    Subjective  Patient reports that after the last treatment she was very tired and almost could not walk into the house due to fatigue.    Currently in Pain?  No/denies         [facility] PT Assessment - 10/19/17 0001      Timed Up and Go Test   Normal TUG (seconds)  30                  [facility] Adult PT Treatment/Exercise - 10/19/17 0001      Ambulation/Gait   Gait Comments  FWW 4x60', then one HHA x 75 feet, cues to relax shoulders to pick up feet, take big steps and also to not swing the leg out      Knee/Hip Exercises: Aerobic   Nustep  Level 5 x 9 minutes      Knee/Hip Exercises:  Machines for Strengthening   Other Machine  red tband ankle DF 3x10 reps      Knee/Hip Exercises: Seated   Other Seated Knee/Hip Exercises  seated Bosu behind her working on partial sit ups    Other Seated Knee/Hip Exercises  Seated PWR moves to help with trunk motions               PT Short Term Goals - 06/05/17 1201      PT SHORT TERM GOAL #1   Title  independent with initial HEP    Status  Achieved        PT Long Term Goals - 10/19/17 1119      PT LONG TERM GOAL #1   Title  decrease timed up and go test to 25 seconds    Status  On-going            Plan - 10/19/17 1112    Clinical Impression Statement  Patient reported after the last visit she barely made it into her home due  to fatigue.  I strongly encouraged her to do more at home, especially walking.  She really wants to try to reach her PLOF but fear is holding her back from doing more at home and she is relying on [place] to try to get her better, I explained to her that she has to do more to get better.    PT Next Visit Plan  continue to advance her walking and function so she can return to her PLOF    Consulted and Agree with Plan of Care  Patient       Patient will benefit from skilled therapeutic intervention in order to improve the following deficits and impairments:  Abnormal gait, Cardiopulmonary status limiting activity, Decreased activity tolerance, Decreased mobility, Decreased endurance, Decreased range of motion, Decreased strength, Difficulty walking, Impaired flexibility, Impaired sensation, Improper body mechanics, Pain, Impaired UE functional use  Visit Diagnosis: Muscle weakness (generalized)  Repeated falls  Difficulty in walking, not elsewhere classified  Late effects of CVA (cerebrovascular accident)  Hemiplegia and hemiparesis following cerebral infarction affecting left non-dominant side (HCC)  Ataxia, unspecified     Problem List Patient Active Problem List   Diagnosis Date  Noted  . Respiratory failure with hypoxia ([place]) 08/30/2017  . Exposure to influenza 08/09/2017  . Nausea without vomiting 05/10/2017  . Hypoxemia   . Heart failure with preserved ejection fraction ([place]), Grade 3 diastolic dysfunction 38/46/6599  . Atrial fibrillation with RVR ([place])   . Chronic diastolic CHF (congestive heart failure) ([place])   . PAF (paroxysmal atrial fibrillation) ([place])   . History of stroke   . Fever   . Metabolic syndrome 35/70/1779  . Encounter for preventive health examination 02/17/2016  . Morbid obesity ([place]) 06/17/2015  . Fall at home 12/01/2014  . Hypomagnesemia 04/24/2014  . Hemiparesis affecting left side as late effect of cerebrovascular accident ([place]) 04/24/2014  . Nontraumatic cerebral hemorrhage ([place]) 04/30/2012  . Diabetes type 2, controlled ([place]) 03/04/2010  . OSTEOPENIA 01/21/2009  . UNSPECIFIED VITAMIN D DEFICIENCY 11/19/2007  . ESSENTIAL HYPERTENSION, BENIGN 11/19/2007  . HYPERCHOLESTEROLEMIA 10/25/2006  . GASTROESOPHAGEAL REFLUX, NO ESOPHAGITIS 10/25/2006  . DIVERTICULOSIS OF COLON 10/25/2006  . Osteoarthritis 10/25/2006  . CERVICAL SPINE DISORDER, NOS 10/25/2006    Sumner Boast., PT 10/19/2017, 11:20 AM  [address] [place], [place], [address] Phone: [phone]   Fax:  [phone]  Name: Karen Jackson MRN: [id number] Date of Birth: April 02, 1941

## 2017-10-23 ENCOUNTER — Encounter: Payer: Self-pay | Admitting: Physical Therapy

## 2017-10-23 ENCOUNTER — Ambulatory Visit: Payer: Medicare Other | Admitting: Physical Therapy

## 2017-10-23 DIAGNOSIS — I69354 Hemiplegia and hemiparesis following cerebral infarction affecting left non-dominant side: Secondary | ICD-10-CM | POA: Diagnosis not present

## 2017-10-23 DIAGNOSIS — R296 Repeated falls: Secondary | ICD-10-CM | POA: Diagnosis not present

## 2017-10-23 DIAGNOSIS — I6990 Unspecified sequelae of unspecified cerebrovascular disease: Secondary | ICD-10-CM | POA: Diagnosis not present

## 2017-10-23 DIAGNOSIS — R262 Difficulty in walking, not elsewhere classified: Secondary | ICD-10-CM | POA: Diagnosis not present

## 2017-10-23 DIAGNOSIS — M6281 Muscle weakness (generalized): Secondary | ICD-10-CM

## 2017-10-23 DIAGNOSIS — R270 Ataxia, unspecified: Secondary | ICD-10-CM

## 2017-10-23 NOTE — Therapy (Signed)
[address] [place], [place], [address] Phone: [phone]   Fax:  [phone]  Physical Therapy Treatment  Patient Details  Name: Karen Jackson MRN: [id number] Date of Birth: 1941-03-22 Referring Provider: Andria Frames   Encounter Date: 10/23/2017  PT End of Session - 10/23/17 1135    Visit Number  37    Date for PT Re-Evaluation  11/19/17    PT Start Time  1005    PT Stop Time  1052    PT Time Calculation (min)  47 min    Activity Tolerance  Patient tolerated treatment well    Behavior During Therapy  [facility] for tasks assessed/performed       Past Medical History:  Diagnosis Date  . Acute cystitis without hematuria   . Acute diastolic CHF (congestive heart failure) ([place])   . Arthritis   . Dyspnea   . Fever of unknown origin 03/19/2017  . Hyperlipidemia   . Hypertension   . Multifocal pneumonia   . Osteopenia   . Persistent atrial fibrillation ([place])   . Stroke [facility]) 2013    Past Surgical History:  Procedure Laterality Date  . ANKLE SURGERY    . APPENDECTOMY    . CHOLECYSTECTOMY    . HERNIA REPAIR     Esophagus  . JOINT REPLACEMENT     total- right partial- left  . MASTECTOMY PARTIAL / LUMPECTOMY  2012   left    There were no vitals filed for this visit.  Subjective Assessment - 10/23/17 1132    Subjective  Patient got her new portable oxygen.  She is unsure how to set it up on walker.    Currently in Pain?  No/denies                      [facility] Adult PT Treatment/Exercise - 10/23/17 0001      Ambulation/Gait   Gait Comments  use of FWW ambulation 3x 60', we tried the new basket she brought in with the oxygen in it, we tried the existing cloth bag and we tried a strap around the patient for the oxygen.  HHA ambulation x 80 feet      Knee/Hip Exercises: Aerobic   Nustep  Level 5 x 10 minutes      Knee/Hip Exercises: Machines for Strengthening   Other Machine  red tband  ankle DF 3x10 reps               PT Short Term Goals - 06/05/17 1201      PT SHORT TERM GOAL #1   Title  independent with initial HEP    Status  Achieved        PT Long Term Goals - 10/19/17 1119      PT LONG TERM GOAL #1   Title  decrease timed up and go test to 25 seconds    Status  On-going            Plan - 10/23/17 1135    Clinical Impression Statement  Patient brought in her new portable oxygen, we tried it in the basket that she brought in on the front of the walker, this seemed to not go well as she felt it was heavy and she was worried it would go forward, tried it with a strap around her shoulder and body, she did not like this, we also tried it in the bag she has on her walker, this  seemed to allow her to feel safer and move more comfortably.  I asked her to bring it again so we can continue to practice as her goal is to be able to go out and eat    PT Next Visit Plan  continue to advance her walking and function so she can return to her PLOF    Consulted and Agree with Plan of Care  Patient       Patient will benefit from skilled therapeutic intervention in order to improve the following deficits and impairments:  Abnormal gait, Cardiopulmonary status limiting activity, Decreased activity tolerance, Decreased mobility, Decreased endurance, Decreased range of motion, Decreased strength, Difficulty walking, Impaired flexibility, Impaired sensation, Improper body mechanics, Pain, Impaired UE functional use  Visit Diagnosis: Muscle weakness (generalized) - Plan: PT plan of care cert/re-cert  Repeated falls - Plan: PT plan of care cert/re-cert  Difficulty in walking, not elsewhere classified - Plan: PT plan of care cert/re-cert  Late effects of CVA (cerebrovascular accident) - Plan: PT plan of care cert/re-cert  Hemiplegia and hemiparesis following cerebral infarction affecting left non-dominant side ([place]) - Plan: PT plan of care cert/re-cert  Ataxia,  unspecified - Plan: PT plan of care cert/re-cert     Problem List Patient Active Problem List   Diagnosis Date Noted  . Respiratory failure with hypoxia ([place]) 08/30/2017  . Exposure to influenza 08/09/2017  . Nausea without vomiting 05/10/2017  . Hypoxemia   . Heart failure with preserved ejection fraction ([place]), Grade 3 diastolic dysfunction 16/05/9603  . Atrial fibrillation with RVR ([place])   . Chronic diastolic CHF (congestive heart failure) ([place])   . PAF (paroxysmal atrial fibrillation) ([place])   . History of stroke   . Fever   . Metabolic syndrome 54/04/8118  . Encounter for preventive health examination 02/17/2016  . Morbid obesity ([place]) 06/17/2015  . Fall at home 12/01/2014  . Hypomagnesemia 04/24/2014  . Hemiparesis affecting left side as late effect of cerebrovascular accident ([place]) 04/24/2014  . Nontraumatic cerebral hemorrhage ([place]) 04/30/2012  . Diabetes type 2, controlled ([place]) 03/04/2010  . OSTEOPENIA 01/21/2009  . UNSPECIFIED VITAMIN D DEFICIENCY 11/19/2007  . ESSENTIAL HYPERTENSION, BENIGN 11/19/2007  . HYPERCHOLESTEROLEMIA 10/25/2006  . GASTROESOPHAGEAL REFLUX, NO ESOPHAGITIS 10/25/2006  . DIVERTICULOSIS OF COLON 10/25/2006  . Osteoarthritis 10/25/2006  . CERVICAL SPINE DISORDER, NOS 10/25/2006    Sumner Boast., PT 10/23/2017, 11:43 AM  [address] [place], [place], [address] Phone: [phone]   Fax:  [phone]  Name: Karen Jackson MRN: [id number] Date of Birth: 1941/03/24

## 2017-10-25 ENCOUNTER — Ambulatory Visit: Payer: Medicare Other | Admitting: Physical Therapy

## 2017-10-25 ENCOUNTER — Encounter: Payer: Self-pay | Admitting: Physical Therapy

## 2017-10-25 DIAGNOSIS — I69354 Hemiplegia and hemiparesis following cerebral infarction affecting left non-dominant side: Secondary | ICD-10-CM | POA: Diagnosis not present

## 2017-10-25 DIAGNOSIS — R296 Repeated falls: Secondary | ICD-10-CM

## 2017-10-25 DIAGNOSIS — R262 Difficulty in walking, not elsewhere classified: Secondary | ICD-10-CM

## 2017-10-25 DIAGNOSIS — R270 Ataxia, unspecified: Secondary | ICD-10-CM

## 2017-10-25 DIAGNOSIS — R252 Cramp and spasm: Secondary | ICD-10-CM

## 2017-10-25 DIAGNOSIS — I6990 Unspecified sequelae of unspecified cerebrovascular disease: Secondary | ICD-10-CM

## 2017-10-25 DIAGNOSIS — M6281 Muscle weakness (generalized): Secondary | ICD-10-CM

## 2017-10-25 NOTE — Therapy (Signed)
[address] [place], [place], [address] Phone: [phone]   Fax:  [phone]  Physical Therapy Treatment  Patient Details  Name: Karen Jackson MRN: [id number] Date of Birth: Jul 08, 1941 Referring Provider: Andria Frames   Encounter Date: 10/25/2017  PT End of Session - 10/25/17 1321    Visit Number  42    Date for PT Re-Evaluation  11/19/17    PT Start Time  1055    PT Stop Time  1150    PT Time Calculation (min)  55 min    Activity Tolerance  Patient tolerated treatment well    Behavior During Therapy  [facility] for tasks assessed/performed       Past Medical History:  Diagnosis Date  . Acute cystitis without hematuria   . Acute diastolic CHF (congestive heart failure) ([place])   . Arthritis   . Dyspnea   . Fever of unknown origin 03/19/2017  . Hyperlipidemia   . Hypertension   . Multifocal pneumonia   . Osteopenia   . Persistent atrial fibrillation ([place])   . Stroke [facility]) 2013    Past Surgical History:  Procedure Laterality Date  . ANKLE SURGERY    . APPENDECTOMY    . CHOLECYSTECTOMY    . HERNIA REPAIR     Esophagus  . JOINT REPLACEMENT     total- right partial- left  . MASTECTOMY PARTIAL / LUMPECTOMY  2012   left    There were no vitals filed for this visit.  Subjective Assessment - 10/25/17 1317    Subjective  Patient reports that she is short of breath today                      [facility] Adult PT Treatment/Exercise - 10/25/17 0001      Ambulation/Gait   Gait Comments  FWW 80'x2, 40' x 2, then HHA x 100 feet, needing cues to relax shoulder and to pick up feet and take bigger steps, she also needs cues to transfer safely when she is tired      Knee/Hip Exercises: Aerobic   Nustep  Level 5 x 10 minutes      Moist Heat Therapy   Number Minutes Moist Heat  15 Minutes    Moist Heat Location  Shoulder;Cervical      Manual Therapy   Manual Therapy  Soft tissue mobilization    Soft tissue mobilization  has a significant trigger point in the left upper trap area, that is causing her neck pain               PT Short Term Goals - 06/05/17 1201      PT SHORT TERM GOAL #1   Title  independent with initial HEP    Status  Achieved        PT Long Term Goals - 10/25/17 1326      PT LONG TERM GOAL #1   Title  decrease timed up and go test to 25 seconds    Status  On-going      PT LONG TERM GOAL #2   Title  increased right LE strength to 4-/5    Status  On-going      PT LONG TERM GOAL #3   Title  walk 200 feet without rest using FWW    Status  Partially Met            Plan - 10/25/17 1322    Clinical Impression  Statement  Patient did not bring in the portable oxygen, she reports that she feels like it was good that way we had it the last treatment.  We have been focusing on her gait and use of the FWW.  Her goal is to be able to walk with the [place] into church as well as be able to go out to eat with friends and walk in to [employer].  She has difficulty with the left LE due to circumduction, she also elevates the right shoulder.  She has a large trigger point in the left upper trap that is giving her issues.    PT Next Visit Plan  continue to advance her walking and function so she can return to her PLOF    Consulted and Agree with Plan of Care  Patient       Patient will benefit from skilled therapeutic intervention in order to improve the following deficits and impairments:  Abnormal gait, Cardiopulmonary status limiting activity, Decreased activity tolerance, Decreased mobility, Decreased endurance, Decreased range of motion, Decreased strength, Difficulty walking, Impaired flexibility, Impaired sensation, Improper body mechanics, Pain, Impaired UE functional use  Visit Diagnosis: Muscle weakness (generalized)  Repeated falls  Difficulty in walking, not elsewhere classified  Late effects of CVA (cerebrovascular accident)  Hemiplegia  and hemiparesis following cerebral infarction affecting left non-dominant side (HCC)  Ataxia, unspecified  Cramp and spasm     Problem List Patient Active Problem List   Diagnosis Date Noted  . Respiratory failure with hypoxia ([place]) 08/30/2017  . Exposure to influenza 08/09/2017  . Nausea without vomiting 05/10/2017  . Hypoxemia   . Heart failure with preserved ejection fraction ([place]), Grade 3 diastolic dysfunction 21/06/5519  . Atrial fibrillation with RVR ([place])   . Chronic diastolic CHF (congestive heart failure) ([place])   . PAF (paroxysmal atrial fibrillation) ([place])   . History of stroke   . Fever   . Metabolic syndrome 80/22/3361  . Encounter for preventive health examination 02/17/2016  . Morbid obesity ([place]) 06/17/2015  . Fall at home 12/01/2014  . Hypomagnesemia 04/24/2014  . Hemiparesis affecting left side as late effect of cerebrovascular accident ([place]) 04/24/2014  . Nontraumatic cerebral hemorrhage ([place]) 04/30/2012  . Diabetes type 2, controlled ([place]) 03/04/2010  . OSTEOPENIA 01/21/2009  . UNSPECIFIED VITAMIN D DEFICIENCY 11/19/2007  . ESSENTIAL HYPERTENSION, BENIGN 11/19/2007  . HYPERCHOLESTEROLEMIA 10/25/2006  . GASTROESOPHAGEAL REFLUX, NO ESOPHAGITIS 10/25/2006  . DIVERTICULOSIS OF COLON 10/25/2006  . Osteoarthritis 10/25/2006  . CERVICAL SPINE DISORDER, NOS 10/25/2006    Sumner Boast., PT 10/25/2017, 1:28 PM  [address] [place], [place], [address] Phone: [phone]   Fax:  [phone]  Name: Karen Jackson MRN: [id number] Date of Birth: 01-Apr-1941

## 2017-10-30 ENCOUNTER — Ambulatory Visit: Payer: Medicare Other | Attending: Family Medicine | Admitting: Physical Therapy

## 2017-10-30 DIAGNOSIS — M6281 Muscle weakness (generalized): Secondary | ICD-10-CM | POA: Diagnosis not present

## 2017-10-30 DIAGNOSIS — I69354 Hemiplegia and hemiparesis following cerebral infarction affecting left non-dominant side: Secondary | ICD-10-CM | POA: Diagnosis not present

## 2017-10-30 DIAGNOSIS — I6990 Unspecified sequelae of unspecified cerebrovascular disease: Secondary | ICD-10-CM | POA: Diagnosis not present

## 2017-10-30 DIAGNOSIS — R252 Cramp and spasm: Secondary | ICD-10-CM | POA: Insufficient documentation

## 2017-10-30 DIAGNOSIS — R296 Repeated falls: Secondary | ICD-10-CM | POA: Diagnosis not present

## 2017-10-30 DIAGNOSIS — R262 Difficulty in walking, not elsewhere classified: Secondary | ICD-10-CM

## 2017-10-30 DIAGNOSIS — R270 Ataxia, unspecified: Secondary | ICD-10-CM | POA: Insufficient documentation

## 2017-10-30 NOTE — Therapy (Deleted)
[address] [place], [place], [address] Phone: [phone]   Fax:  [phone]  Patient Details  Name: Karen Jackson MRN: [id number] Date of Birth: Dec 17, 1940 Referring Provider:  Zenia Resides, MD  Encounter Date: 10/30/2017   Laqueta Carina 10/30/2017, 10:44 AM  [address], [place], [address] Phone: [phone]   Fax:  [phone]

## 2017-10-30 NOTE — Therapy (Signed)
[address] [place], [place], [address] Phone: [phone]   Fax:  [phone]  Physical Therapy Treatment  Patient Details  Name: Karen [place] MRN: [id number] Date of Birth: 12/05/1940 Referring Provider: Andria Frames   Encounter Date: 10/30/2017  PT End of Session - 10/30/17 1041    Visit Number  39    Date for PT Re-Evaluation  11/19/17    PT Start Time  1000    PT Stop Time  1100    PT Time Calculation (min)  60 min       Past Medical History:  Diagnosis Date  . Acute cystitis without hematuria   . Acute diastolic CHF (congestive heart failure) ([place])   . Arthritis   . Dyspnea   . Fever of unknown origin 03/19/2017  . Hyperlipidemia   . Hypertension   . Multifocal pneumonia   . Osteopenia   . Persistent atrial fibrillation ([place])   . Stroke [facility]) 2013    Past Surgical History:  Procedure Laterality Date  . ANKLE SURGERY    . APPENDECTOMY    . CHOLECYSTECTOMY    . HERNIA REPAIR     Esophagus  . JOINT REPLACEMENT     total- right partial- left  . MASTECTOMY PARTIAL / LUMPECTOMY  2012   left    There were no vitals filed for this visit.  Subjective Assessment - 10/30/17 1008    Subjective  nothing new- bad shloulder and still SOB    Currently in Pain?  Yes    Pain Score  6     Pain Location  Shoulder    Pain Orientation  Left                      [facility] Adult PT Treatment/Exercise - 10/30/17 0001      Ambulation/Gait   Pre-Gait Activities  amb 60 feet RW no rest- end of session O2    Gait Comments  amb 100 feet with RW  CG A with cuing to relax  shld ( O2 92) 2 standing rest breaks and last 15 feet left knee buckling about 140 feet to amb into church      Knee/Hip Exercises: Aerobic   Nustep  Level 5 x 23mnutes 45 sec 700 steps  O 2 88 after 6o sec 92    Other Aerobic  amb in HHA 85 feet min A with LOB with turn and O2 dropped to 89      Manual Therapy   Manual  Therapy  Soft tissue mobilization;Taping    Soft tissue mobilization  has a significant trigger point in the left upper trap area, that is causing her neck pain    Kinesiotex  Create Space               PT Short Term Goals - 06/05/17 1201      PT SHORT TERM GOAL #1   Title  independent with initial HEP    Status  Achieved        PT Long Term Goals - 10/25/17 1326      PT LONG TERM GOAL #1   Title  decrease timed up and go test to 25 seconds    Status  On-going      PT LONG TERM GOAL #2   Title  increased right LE strength to 4-/5    Status  On-going      PT LONG TERM GOAL #  3   Title  walk 200 feet without rest using FWW    Status  Partially Met            Plan - 10/30/17 1041    Clinical Impression Statement  pt needs to amb about 140 feet to amb into church , up to 100 feet but needed 2 standing rest breaks and left knee started to buckle last 15 feet. O 2 stats dropped into high 80 today but recovered into low 90 after 60-90 sec. cuing to relax bil shld with gait and tried kinesiotape to left YT trigger point    PT Treatment/Interventions  ADLs/Self Care Home Management;Electrical Stimulation;Moist Heat;Therapeutic exercise;Therapeutic activities;Functional mobility training;Gait training;Balance training;Neuromuscular re-education;Patient/family education;Manual techniques;Passive range of motion    PT Next Visit Plan  continue to advance her walking and function so she can return to her PLOF       Patient will benefit from skilled therapeutic intervention in order to improve the following deficits and impairments:  Abnormal gait, Cardiopulmonary status limiting activity, Decreased activity tolerance, Decreased mobility, Decreased endurance, Decreased range of motion, Decreased strength, Difficulty walking, Impaired flexibility, Impaired sensation, Improper body mechanics, Pain, Impaired UE functional use  Visit Diagnosis: Muscle weakness  (generalized)  Repeated falls  Difficulty in walking, not elsewhere classified     Problem List Patient Active Problem List   Diagnosis Date Noted  . Respiratory failure with hypoxia ([place]) 08/30/2017  . Exposure to influenza 08/09/2017  . Nausea without vomiting 05/10/2017  . Hypoxemia   . Heart failure with preserved ejection fraction ([place]), Grade 3 diastolic dysfunction 63/33/5456  . Atrial fibrillation with RVR ([place])   . Chronic diastolic CHF (congestive heart failure) ([place])   . PAF (paroxysmal atrial fibrillation) ([place])   . History of stroke   . Fever   . Metabolic syndrome 25/63/8937  . Encounter for preventive health examination 02/17/2016  . Morbid obesity ([place]) 06/17/2015  . Fall at home 12/01/2014  . Hypomagnesemia 04/24/2014  . Hemiparesis affecting left side as late effect of cerebrovascular accident ([place]) 04/24/2014  . Nontraumatic cerebral hemorrhage ([place]) 04/30/2012  . Diabetes type 2, controlled ([place]) 03/04/2010  . OSTEOPENIA 01/21/2009  . UNSPECIFIED VITAMIN D DEFICIENCY 11/19/2007  . ESSENTIAL HYPERTENSION, BENIGN 11/19/2007  . HYPERCHOLESTEROLEMIA 10/25/2006  . GASTROESOPHAGEAL REFLUX, NO ESOPHAGITIS 10/25/2006  . DIVERTICULOSIS OF COLON 10/25/2006  . Osteoarthritis 10/25/2006  . CERVICAL SPINE DISORDER, NOS 10/25/2006    PAYSEUR,ANGIE PTA 10/30/2017, 10:45 AM  [address] [place], [place], [address] Phone: [phone]   Fax:  [phone]  Name: Karen [place] MRN: [id number] Date of Birth: [birth year]

## 2017-11-01 ENCOUNTER — Ambulatory Visit: Payer: Medicare Other | Admitting: Physical Therapy

## 2017-11-01 DIAGNOSIS — I69354 Hemiplegia and hemiparesis following cerebral infarction affecting left non-dominant side: Secondary | ICD-10-CM | POA: Diagnosis not present

## 2017-11-01 DIAGNOSIS — R270 Ataxia, unspecified: Secondary | ICD-10-CM | POA: Diagnosis not present

## 2017-11-01 DIAGNOSIS — R262 Difficulty in walking, not elsewhere classified: Secondary | ICD-10-CM

## 2017-11-01 DIAGNOSIS — I6990 Unspecified sequelae of unspecified cerebrovascular disease: Secondary | ICD-10-CM | POA: Diagnosis not present

## 2017-11-01 DIAGNOSIS — M6281 Muscle weakness (generalized): Secondary | ICD-10-CM

## 2017-11-01 DIAGNOSIS — R296 Repeated falls: Secondary | ICD-10-CM | POA: Diagnosis not present

## 2017-11-01 NOTE — Therapy (Signed)
[address] [place], [place], [address] Phone: [phone]   Fax:  [phone]  Physical Therapy Treatment  Patient Details  Name: Karen [place] MRN: [id number] Date of Birth: 03-16-1941 Referring Provider: Andria Frames   Encounter Date: 11/01/2017  PT End of Session - 11/01/17 1125    Visit Number  40    Date for PT Re-Evaluation  11/19/17    PT Start Time  1045    PT Stop Time  1145    PT Time Calculation (min)  60 min       Past Medical History:  Diagnosis Date  . Acute cystitis without hematuria   . Acute diastolic CHF (congestive heart failure) ([place])   . Arthritis   . Dyspnea   . Fever of unknown origin 03/19/2017  . Hyperlipidemia   . Hypertension   . Multifocal pneumonia   . Osteopenia   . Persistent atrial fibrillation ([place])   . Stroke [facility]) 2013    Past Surgical History:  Procedure Laterality Date  . ANKLE SURGERY    . APPENDECTOMY    . CHOLECYSTECTOMY    . HERNIA REPAIR     Esophagus  . JOINT REPLACEMENT     total- right partial- left  . MASTECTOMY PARTIAL / LUMPECTOMY  2012   left    There were no vitals filed for this visit.  Subjective Assessment - 11/01/17 1052    Subjective  shld better, tape helped. too much walking last session so increased knee pain since    Currently in Pain?  Yes    Pain Score  5     Pain Location  Knee    Pain Orientation  Left                      [facility] Adult PT Treatment/Exercise - 11/01/17 0001      Ambulation/Gait   Gait Comments  amb 100 feet with walker CGA , LOB 1 x requiring min A O2 91. seated rest repeated 2 min 30 sec and O2 89 with increased fatigued      Knee/Hip Exercises: Aerobic   Nustep  Level 5 x 10 min  700 steps  HR 92      Knee/Hip Exercises: Standing   Other Standing Knee Exercises  3# with RW hip flex,ext and abd 10 each difficulty maintaining full knee ext on stance leg min A      Knee/Hip Exercises:  Seated   Long Arc Quad  Strengthening;10 reps;Weights;Both    Long Arc Quad Weight  3 lbs.      Moist Heat Therapy   Number Minutes Moist Heat  15 Minutes    Moist Heat Location  Shoulder;Cervical      Manual Therapy   Manual Therapy  Soft tissue mobilization;Taping    Kinesiotex  Create Space               PT Short Term Goals - 06/05/17 1201      PT SHORT TERM GOAL #1   Title  independent with initial HEP    Status  Achieved        PT Long Term Goals - 10/25/17 1326      PT LONG TERM GOAL #1   Title  decrease timed up and go test to 25 seconds    Status  On-going      PT LONG TERM GOAL #2   Title  increased right LE  strength to 4-/5    Status  On-going      PT LONG TERM GOAL #3   Title  walk 200 feet without rest using FWW    Status  Partially Met            Plan - 11/01/17 1125    Clinical Impression Statement  pt continues to struggle with achieving distance adequate to amb into church as PLOF. Pt does well at about 60 feet then as she increases up too 100 feet fatigues and occassional LOB. Left trigger pt significantly less.    PT Treatment/Interventions  ADLs/Self Care Home Management;Electrical Stimulation;Moist Heat;Therapeutic exercise;Therapeutic activities;Functional mobility training;Gait training;Balance training;Neuromuscular re-education;Patient/family education;Manual techniques;Passive range of motion    PT Next Visit Plan  continue to advance her walking and function so she can return to her PLOF       Patient will benefit from skilled therapeutic intervention in order to improve the following deficits and impairments:  Abnormal gait, Cardiopulmonary status limiting activity, Decreased activity tolerance, Decreased mobility, Decreased endurance, Decreased range of motion, Decreased strength, Difficulty walking, Impaired flexibility, Impaired sensation, Improper body mechanics, Pain, Impaired UE functional use  Visit Diagnosis: Muscle  weakness (generalized)  Repeated falls  Difficulty in walking, not elsewhere classified     Problem List Patient Active Problem List   Diagnosis Date Noted  . Respiratory failure with hypoxia ([place]) 08/30/2017  . Exposure to influenza 08/09/2017  . Nausea without vomiting 05/10/2017  . Hypoxemia   . Heart failure with preserved ejection fraction ([place]), Grade 3 diastolic dysfunction 05/39/7673  . Atrial fibrillation with RVR ([place])   . Chronic diastolic CHF (congestive heart failure) ([place])   . PAF (paroxysmal atrial fibrillation) ([place])   . History of stroke   . Fever   . Metabolic syndrome 41/93/7902  . Encounter for preventive health examination 02/17/2016  . Morbid obesity ([place]) 06/17/2015  . Fall at home 12/01/2014  . Hypomagnesemia 04/24/2014  . Hemiparesis affecting left side as late effect of cerebrovascular accident ([place]) 04/24/2014  . Nontraumatic cerebral hemorrhage ([place]) 04/30/2012  . Diabetes type 2, controlled ([place]) 03/04/2010  . OSTEOPENIA 01/21/2009  . UNSPECIFIED VITAMIN D DEFICIENCY 11/19/2007  . ESSENTIAL HYPERTENSION, BENIGN 11/19/2007  . HYPERCHOLESTEROLEMIA 10/25/2006  . GASTROESOPHAGEAL REFLUX, NO ESOPHAGITIS 10/25/2006  . DIVERTICULOSIS OF COLON 10/25/2006  . Osteoarthritis 10/25/2006  . CERVICAL SPINE DISORDER, NOS 10/25/2006    Merelin Human,ANGIE PTA 11/01/2017, 11:29 AM  [address] [place], [place], [address] Phone: [phone]   Fax:  [phone]  Name: SAMYRIA RUDIE MRN: [id number] Date of Birth: 1941/03/29

## 2017-11-02 ENCOUNTER — Encounter: Payer: Self-pay | Admitting: Family Medicine

## 2017-11-06 ENCOUNTER — Encounter: Payer: Self-pay | Admitting: Physical Therapy

## 2017-11-06 ENCOUNTER — Ambulatory Visit: Payer: Medicare Other | Admitting: Physical Therapy

## 2017-11-06 DIAGNOSIS — I6990 Unspecified sequelae of unspecified cerebrovascular disease: Secondary | ICD-10-CM | POA: Diagnosis not present

## 2017-11-06 DIAGNOSIS — M6281 Muscle weakness (generalized): Secondary | ICD-10-CM

## 2017-11-06 DIAGNOSIS — I69354 Hemiplegia and hemiparesis following cerebral infarction affecting left non-dominant side: Secondary | ICD-10-CM

## 2017-11-06 DIAGNOSIS — R262 Difficulty in walking, not elsewhere classified: Secondary | ICD-10-CM | POA: Diagnosis not present

## 2017-11-06 DIAGNOSIS — R296 Repeated falls: Secondary | ICD-10-CM | POA: Diagnosis not present

## 2017-11-06 DIAGNOSIS — R270 Ataxia, unspecified: Secondary | ICD-10-CM | POA: Diagnosis not present

## 2017-11-06 NOTE — Therapy (Signed)
[address] [place], [place], [address] Phone: [phone]   Fax:  [phone]  Physical Therapy Treatment  Patient Details  Name: Karen [place] MRN: [id number] Date of Birth: 1941-06-07 Referring Provider: Andria Frames   Encounter Date: 11/06/2017  PT End of Session - 11/06/17 1320    Visit Number  41    Date for PT Re-Evaluation  11/19/17    PT Start Time  1054    PT Stop Time  1143    PT Time Calculation (min)  49 min    Activity Tolerance  Patient tolerated treatment well    Behavior During Therapy  [facility] for tasks assessed/performed       Past Medical History:  Diagnosis Date  . Acute cystitis without hematuria   . Acute diastolic CHF (congestive heart failure) ([place])   . Arthritis   . Dyspnea   . Fever of unknown origin 03/19/2017  . Hyperlipidemia   . Hypertension   . Multifocal pneumonia   . Osteopenia   . Persistent atrial fibrillation ([place])   . Stroke [facility]) 2013    Past Surgical History:  Procedure Laterality Date  . ANKLE SURGERY    . APPENDECTOMY    . CHOLECYSTECTOMY    . HERNIA REPAIR     Esophagus  . JOINT REPLACEMENT     total- right partial- left  . MASTECTOMY PARTIAL / LUMPECTOMY  2012   left    There were no vitals filed for this visit.  Subjective Assessment - 11/06/17 1104    Subjective  Patient reports doing okay, the tape has helped the knot in the left upper trap, still has the knot.  She is not doing as much at home as she should be.    Currently in Pain?  Yes    Pain Score  4     Pain Location  Shoulder    Pain Orientation  Left    Pain Descriptors / Indicators  Spasm    Pain Relieving Factors  tape helps                      [facility] Adult PT Treatment/Exercise - 11/06/17 0001      Ambulation/Gait   Gait Comments  gait 70fet x 4, then 20 feet with her doing car transfer on her own      Knee/Hip Exercises: Aerobic   Nustep  Level 5 x 10 min        Knee/Hip Exercises: Machines for Strengthening   Other Machine  red tband ankle DF, eversion and HS curls      Knee/Hip Exercises: Standing   Heel Raises  2 sets;Both;10 reps    Other Standing Knee Exercises  4" toe clears 2x10 each      Knee/Hip Exercises: Seated   Long Arc Quad  Strengthening;10 reps;Weights;Both    Long Arc Quad Weight  3 lbs.    Abduction/Adduction   3 sets;10 reps               PT Short Term Goals - 06/05/17 1201      PT SHORT TERM GOAL #1   Title  independent with initial HEP    Status  Achieved        PT Long Term Goals - 10/25/17 1326      PT LONG TERM GOAL #1   Title  decrease timed up and go test to 25 seconds    Status  On-going      PT LONG TERM GOAL #2   Title  increased right LE strength to 4-/5    Status  On-going      PT LONG TERM GOAL #3   Title  walk 200 feet without rest using FWW    Status  Partially Met            Plan - 11/06/17 1323    Clinical Impression Statement  I have continued to encourage the patient to do more and more at home, she reports that she is trying, the only issue that I have seen is that she has care givers whose job it is to help her and Ms. Flud allows that because it is easier.  I had her do more on her own today with the car transfer and try to have her use the machine (NuStep) and have her do most of the adjustments because if not she will allow me to do it all and she will not try.    PT Next Visit Plan  continue to advance her walking and function so she can return to her PLOF    Consulted and Agree with Plan of Care  Patient       Patient will benefit from skilled therapeutic intervention in order to improve the following deficits and impairments:  Abnormal gait, Cardiopulmonary status limiting activity, Decreased activity tolerance, Decreased mobility, Decreased endurance, Decreased range of motion, Decreased strength, Difficulty walking, Impaired flexibility, Impaired sensation, Improper  body mechanics, Pain, Impaired UE functional use  Visit Diagnosis: Muscle weakness (generalized)  Repeated falls  Difficulty in walking, not elsewhere classified  Late effects of CVA (cerebrovascular accident)  Hemiplegia and hemiparesis following cerebral infarction affecting left non-dominant side (HCC)  Ataxia, unspecified     Problem List Patient Active Problem List   Diagnosis Date Noted  . Respiratory failure with hypoxia ([place]) 08/30/2017  . Exposure to influenza 08/09/2017  . Nausea without vomiting 05/10/2017  . Hypoxemia   . Heart failure with preserved ejection fraction ([place]), Grade 3 diastolic dysfunction 51/76/1607  . Atrial fibrillation with RVR ([place])   . Chronic diastolic CHF (congestive heart failure) ([place])   . PAF (paroxysmal atrial fibrillation) ([place])   . History of stroke   . Fever   . Metabolic syndrome 37/05/6268  . Encounter for preventive health examination 02/17/2016  . Morbid obesity ([place]) 06/17/2015  . Fall at home 12/01/2014  . Hypomagnesemia 04/24/2014  . Hemiparesis affecting left side as late effect of cerebrovascular accident ([place]) 04/24/2014  . Nontraumatic cerebral hemorrhage ([place]) 04/30/2012  . Diabetes type 2, controlled ([place]) 03/04/2010  . OSTEOPENIA 01/21/2009  . UNSPECIFIED VITAMIN D DEFICIENCY 11/19/2007  . ESSENTIAL HYPERTENSION, BENIGN 11/19/2007  . HYPERCHOLESTEROLEMIA 10/25/2006  . GASTROESOPHAGEAL REFLUX, NO ESOPHAGITIS 10/25/2006  . DIVERTICULOSIS OF COLON 10/25/2006  . Osteoarthritis 10/25/2006  . CERVICAL SPINE DISORDER, NOS 10/25/2006    Sumner Boast., PT 11/06/2017, 1:26 PM  [address] [place], [place], [address] Phone: [phone]   Fax:  [phone]  Name: Karen [place] MRN: [id number] Date of Birth: 1941/07/02

## 2017-11-08 ENCOUNTER — Encounter: Payer: Self-pay | Admitting: Physical Therapy

## 2017-11-08 DIAGNOSIS — L821 Other seborrheic keratosis: Secondary | ICD-10-CM | POA: Diagnosis not present

## 2017-11-08 DIAGNOSIS — Z85828 Personal history of other malignant neoplasm of skin: Secondary | ICD-10-CM | POA: Diagnosis not present

## 2017-11-08 DIAGNOSIS — L820 Inflamed seborrheic keratosis: Secondary | ICD-10-CM | POA: Diagnosis not present

## 2017-11-08 DIAGNOSIS — L720 Epidermal cyst: Secondary | ICD-10-CM | POA: Diagnosis not present

## 2017-11-08 DIAGNOSIS — D485 Neoplasm of uncertain behavior of skin: Secondary | ICD-10-CM | POA: Diagnosis not present

## 2017-11-09 ENCOUNTER — Ambulatory Visit: Payer: Medicare Other | Admitting: Physical Therapy

## 2017-11-09 ENCOUNTER — Encounter: Payer: Self-pay | Admitting: Physical Therapy

## 2017-11-09 DIAGNOSIS — R296 Repeated falls: Secondary | ICD-10-CM

## 2017-11-09 DIAGNOSIS — R270 Ataxia, unspecified: Secondary | ICD-10-CM | POA: Diagnosis not present

## 2017-11-09 DIAGNOSIS — R262 Difficulty in walking, not elsewhere classified: Secondary | ICD-10-CM

## 2017-11-09 DIAGNOSIS — M6281 Muscle weakness (generalized): Secondary | ICD-10-CM

## 2017-11-09 DIAGNOSIS — I6990 Unspecified sequelae of unspecified cerebrovascular disease: Secondary | ICD-10-CM | POA: Diagnosis not present

## 2017-11-09 DIAGNOSIS — I69354 Hemiplegia and hemiparesis following cerebral infarction affecting left non-dominant side: Secondary | ICD-10-CM

## 2017-11-09 DIAGNOSIS — R252 Cramp and spasm: Secondary | ICD-10-CM

## 2017-11-09 NOTE — Therapy (Signed)
[address] [place], [place], [address] Phone: [phone]   Fax:  [phone]  Physical Therapy Treatment  Patient Details  Name: Karen Jackson MRN: [id number] Date of Birth: [birth year] Referring Provider: Andria Frames   Encounter Date: 11/09/2017  PT End of Session - 11/09/17 1205    Visit Number  42    Date for PT Re-Evaluation  11/19/17    PT Start Time  1010    PT Stop Time  1100    PT Time Calculation (min)  50 min    Activity Tolerance  Patient tolerated treatment well    Behavior During Therapy  [facility] for tasks assessed/performed       Past Medical History:  Diagnosis Date  . Acute cystitis without hematuria   . Acute diastolic CHF (congestive heart failure) ([place])   . Arthritis   . Dyspnea   . Fever of unknown origin 03/19/2017  . Hyperlipidemia   . Hypertension   . Multifocal pneumonia   . Osteopenia   . Persistent atrial fibrillation ([place])   . Stroke [facility]) 2013    Past Surgical History:  Procedure Laterality Date  . ANKLE SURGERY    . APPENDECTOMY    . CHOLECYSTECTOMY    . HERNIA REPAIR     Esophagus  . JOINT REPLACEMENT     total- right partial- left  . MASTECTOMY PARTIAL / LUMPECTOMY  2012   left    There were no vitals filed for this visit.  Subjective Assessment - 11/09/17 1017    Subjective  Reports that her ankle and knee are sore today    Currently in Pain?  Yes    Pain Score  3     Pain Location  Knee                      [facility] Adult PT Treatment/Exercise - 11/09/17 0001      Ambulation/Gait   Gait Comments  gait HHA 2x100 feet, with FWW, 3 x 60 feet      Knee/Hip Exercises: Aerobic   Nustep  Level 5 x 10 min       Knee/Hip Exercises: Machines for Strengthening   Other Machine  red tband ankle DF, eversion and HS curls      Knee/Hip Exercises: Standing   Heel Raises  2 sets;Both;10 reps    Hip Flexion  2 sets;10 reps;Limitations    Hip Flexion  Limitations  2#    Hip Abduction  Both;3 sets;10 reps    Abduction Limitations  2#    Hip Extension  Both;2 sets;10 reps    Extension Limitations  2#      Knee/Hip Exercises: Seated   Abduction/Adduction   3 sets;10 reps    Abd/Adduction Limitations  cleating 4" step to the side               PT Short Term Goals - 06/05/17 1201      PT SHORT TERM GOAL #1   Title  independent with initial HEP    Status  Achieved        PT Long Term Goals - 10/25/17 1326      PT LONG TERM GOAL #1   Title  decrease timed up and go test to 25 seconds    Status  On-going      PT LONG TERM GOAL #2   Title  increased right LE strength to 4-/5  Status  On-going      PT LONG TERM GOAL #3   Title  walk 200 feet without rest using FWW    Status  Partially Met            Plan - 11/09/17 1206    Clinical Impression Statement  Patient able to do more walking with HHA, less O2 drop, tends to drop more with walker as she is expending more energy.  Spoke with her about needing to do on own.    PT Next Visit Plan  may work with her and caregiver on doing gym on own    Consulted and Agree with Plan of Care  Patient       Patient will benefit from skilled therapeutic intervention in order to improve the following deficits and impairments:  Abnormal gait, Cardiopulmonary status limiting activity, Decreased activity tolerance, Decreased mobility, Decreased endurance, Decreased range of motion, Decreased strength, Difficulty walking, Impaired flexibility, Impaired sensation, Improper body mechanics, Pain, Impaired UE functional use  Visit Diagnosis: Muscle weakness (generalized)  Repeated falls  Difficulty in walking, not elsewhere classified  Late effects of CVA (cerebrovascular accident)  Hemiplegia and hemiparesis following cerebral infarction affecting left non-dominant side (HCC)  Cramp and spasm  Ataxia, unspecified     Problem List Patient Active Problem List    Diagnosis Date Noted  . Respiratory failure with hypoxia ([place]) 08/30/2017  . Exposure to influenza 08/09/2017  . Nausea without vomiting 05/10/2017  . Hypoxemia   . Heart failure with preserved ejection fraction ([place]), Grade 3 diastolic dysfunction 53/29/9242  . Atrial fibrillation with RVR ([place])   . Chronic diastolic CHF (congestive heart failure) ([place])   . PAF (paroxysmal atrial fibrillation) ([place])   . History of stroke   . Fever   . Metabolic syndrome 68/34/1962  . Encounter for preventive health examination 02/17/2016  . Morbid obesity ([place]) 06/17/2015  . Fall at home 12/01/2014  . Hypomagnesemia 04/24/2014  . Hemiparesis affecting left side as late effect of cerebrovascular accident ([place]) 04/24/2014  . Nontraumatic cerebral hemorrhage ([place]) 04/30/2012  . Diabetes type 2, controlled ([place]) 03/04/2010  . OSTEOPENIA 01/21/2009  . UNSPECIFIED VITAMIN D DEFICIENCY 11/19/2007  . ESSENTIAL HYPERTENSION, BENIGN 11/19/2007  . HYPERCHOLESTEROLEMIA 10/25/2006  . GASTROESOPHAGEAL REFLUX, NO ESOPHAGITIS 10/25/2006  . DIVERTICULOSIS OF COLON 10/25/2006  . Osteoarthritis 10/25/2006  . CERVICAL SPINE DISORDER, NOS 10/25/2006    Sumner Boast., PT 11/09/2017, 12:08 PM  [address] [place], [place], [address] Phone: [phone]   Fax:  [phone]  Name: Karen Jackson MRN: [id number] Date of Birth: May 04, 1941

## 2017-11-13 ENCOUNTER — Ambulatory Visit: Payer: Medicare Other | Admitting: Physical Therapy

## 2017-11-13 ENCOUNTER — Encounter: Payer: Self-pay | Admitting: Physical Therapy

## 2017-11-13 DIAGNOSIS — M6281 Muscle weakness (generalized): Secondary | ICD-10-CM | POA: Diagnosis not present

## 2017-11-13 DIAGNOSIS — I69354 Hemiplegia and hemiparesis following cerebral infarction affecting left non-dominant side: Secondary | ICD-10-CM

## 2017-11-13 DIAGNOSIS — R296 Repeated falls: Secondary | ICD-10-CM

## 2017-11-13 DIAGNOSIS — I6990 Unspecified sequelae of unspecified cerebrovascular disease: Secondary | ICD-10-CM

## 2017-11-13 DIAGNOSIS — R262 Difficulty in walking, not elsewhere classified: Secondary | ICD-10-CM | POA: Diagnosis not present

## 2017-11-13 DIAGNOSIS — R270 Ataxia, unspecified: Secondary | ICD-10-CM | POA: Diagnosis not present

## 2017-11-13 NOTE — Therapy (Signed)
[address] [place], [place], [address] Phone: [phone]   Fax:  [phone]  Physical Therapy Treatment  Patient Details  Name: Karen Jackson MRN: [id number] Date of Birth: [birth year] Referring Provider: Andria Frames   Encounter Date: 11/13/2017  PT End of Session - 11/13/17 1151    Visit Number  43    Date for PT Re-Evaluation  11/19/17    PT Start Time  1055    PT Stop Time  1142    PT Time Calculation (min)  47 min    Activity Tolerance  Patient tolerated treatment well    Behavior During Therapy  [facility] for tasks assessed/performed       Past Medical History:  Diagnosis Date  . Acute cystitis without hematuria   . Acute diastolic CHF (congestive heart failure) ([place])   . Arthritis   . Dyspnea   . Fever of unknown origin 03/19/2017  . Hyperlipidemia   . Hypertension   . Multifocal pneumonia   . Osteopenia   . Persistent atrial fibrillation ([place])   . Stroke [facility]) 2013    Past Surgical History:  Procedure Laterality Date  . ANKLE SURGERY    . APPENDECTOMY    . CHOLECYSTECTOMY    . HERNIA REPAIR     Esophagus  . JOINT REPLACEMENT     total- right partial- left  . MASTECTOMY PARTIAL / LUMPECTOMY  2012   left    There were no vitals filed for this visit.  Subjective Assessment - 11/13/17 1146    Subjective  Patient reports that she has been using oxygen more.  Reports that this coming weekend whe will try to walk into church    Currently in Pain?  No/denies                      [facility] Adult PT Treatment/Exercise - 11/13/17 0001      Transfers   Comments  had her do her own transfers in and out of car today, she usually asks for help or lets her caregiver do it, needed much time and some cues for safety      Ambulation/Gait   Gait Comments  gait with FWW 100' x 3, fatigued and SOB with this, cues to relax shoulders and watch out for circumduction      Knee/Hip Exercises:  Aerobic   Nustep  Level 5 x 10 min       Knee/Hip Exercises: Machines for Strengthening   Other Machine  red tband ankle DF, eversion and HS curls, green tband scapular stabilization      Knee/Hip Exercises: Standing   Hip Flexion  2 sets;10 reps;Limitations    Hip Abduction  Both;3 sets;10 reps    Abduction Limitations  2#    Hip Extension  Both;2 sets;10 reps    Extension Limitations  2#    Other Standing Knee Exercises  4" toe clears 2x10 each      Knee/Hip Exercises: Seated   Long Arc Quad  Strengthening;10 reps;Weights;Both    Long Arc Quad Weight  3 lbs.    Ball Squeeze  30 reps               PT Short Term Goals - 06/05/17 1201      PT SHORT TERM GOAL #1   Title  independent with initial HEP    Status  Achieved        PT Long Term  Goals - 10/25/17 1326      PT LONG TERM GOAL #1   Title  decrease timed up and go test to 25 seconds    Status  On-going      PT LONG TERM GOAL #2   Title  increased right LE strength to 4-/5    Status  On-going      PT LONG TERM GOAL #3   Title  walk 200 feet without rest using FWW    Status  Partially Met            Plan - 11/13/17 1152    Clinical Impression Statement  Her goal is to walk into church, she reports that it is about 60-70'.  She was able to walk 100' 3 x today, she does have difficulty with any turns as the O2 machine is about 2-3# and is on the front of her walker, she reports that it is very heavy and she has dificulty with manuevering the walker.  Her biggest issue is in and out of the car.  She always has a caregiver and the caregiver does most of the work, I asked her today to do on her own.  Very difficult for her.  I also instructed caregiver in exercises to do with patient after our DC    PT Next Visit Plan  may work with her and caregiver on doing gym on own and DC    Consulted and Agree with Plan of Care  Patient       Patient will benefit from skilled therapeutic intervention in order to  improve the following deficits and impairments:  Abnormal gait, Cardiopulmonary status limiting activity, Decreased activity tolerance, Decreased mobility, Decreased endurance, Decreased range of motion, Decreased strength, Difficulty walking, Impaired flexibility, Impaired sensation, Improper body mechanics, Pain, Impaired UE functional use  Visit Diagnosis: Muscle weakness (generalized)  Repeated falls  Difficulty in walking, not elsewhere classified  Late effects of CVA (cerebrovascular accident)  Hemiplegia and hemiparesis following cerebral infarction affecting left non-dominant side [facility])     Problem List Patient Active Problem List   Diagnosis Date Noted  . Respiratory failure with hypoxia ([place]) 08/30/2017  . Exposure to influenza 08/09/2017  . Nausea without vomiting 05/10/2017  . Hypoxemia   . Heart failure with preserved ejection fraction ([place]), Grade 3 diastolic dysfunction 67/89/3810  . Atrial fibrillation with RVR ([place])   . Chronic diastolic CHF (congestive heart failure) ([place])   . PAF (paroxysmal atrial fibrillation) ([place])   . History of stroke   . Fever   . Metabolic syndrome 17/51/0258  . Encounter for preventive health examination 02/17/2016  . Morbid obesity ([place]) 06/17/2015  . Fall at home 12/01/2014  . Hypomagnesemia 04/24/2014  . Hemiparesis affecting left side as late effect of cerebrovascular accident ([place]) 04/24/2014  . Nontraumatic cerebral hemorrhage ([place]) 04/30/2012  . Diabetes type 2, controlled ([place]) 03/04/2010  . OSTEOPENIA 01/21/2009  . UNSPECIFIED VITAMIN D DEFICIENCY 11/19/2007  . ESSENTIAL HYPERTENSION, BENIGN 11/19/2007  . HYPERCHOLESTEROLEMIA 10/25/2006  . GASTROESOPHAGEAL REFLUX, NO ESOPHAGITIS 10/25/2006  . DIVERTICULOSIS OF COLON 10/25/2006  . Osteoarthritis 10/25/2006  . CERVICAL SPINE DISORDER, NOS 10/25/2006    Sumner Boast., PT 11/13/2017, 11:56 AM  [address] [place], [place], [address] Phone: [phone]   Fax:  [phone]  Name: GEMINI BUNTE MRN: [id number] Date of Birth: May 08, 1941

## 2017-11-15 ENCOUNTER — Encounter: Payer: Self-pay | Admitting: Physical Therapy

## 2017-11-15 ENCOUNTER — Ambulatory Visit: Payer: Medicare Other | Admitting: Physical Therapy

## 2017-11-15 DIAGNOSIS — I69354 Hemiplegia and hemiparesis following cerebral infarction affecting left non-dominant side: Secondary | ICD-10-CM | POA: Diagnosis not present

## 2017-11-15 DIAGNOSIS — R296 Repeated falls: Secondary | ICD-10-CM | POA: Diagnosis not present

## 2017-11-15 DIAGNOSIS — R262 Difficulty in walking, not elsewhere classified: Secondary | ICD-10-CM

## 2017-11-15 DIAGNOSIS — R270 Ataxia, unspecified: Secondary | ICD-10-CM | POA: Diagnosis not present

## 2017-11-15 DIAGNOSIS — I6990 Unspecified sequelae of unspecified cerebrovascular disease: Secondary | ICD-10-CM | POA: Diagnosis not present

## 2017-11-15 DIAGNOSIS — M6281 Muscle weakness (generalized): Secondary | ICD-10-CM | POA: Diagnosis not present

## 2017-11-15 NOTE — Therapy (Signed)
[address] [place], [place], [address] Phone: [phone]   Fax:  [phone]  Physical Therapy Treatment  Patient Details  Name: Karen Jackson MRN: [id number] Date of Birth: [birth year] Referring Provider: Andria Frames   Encounter Date: 11/15/2017  PT End of Session - 11/15/17 1217    Visit Number  7    Date for PT Re-Evaluation  11/19/17    PT Start Time  1055    PT Stop Time  1140    PT Time Calculation (min)  45 min    Activity Tolerance  Patient tolerated treatment well    Behavior During Therapy  [facility] for tasks assessed/performed       Past Medical History:  Diagnosis Date  . Acute cystitis without hematuria   . Acute diastolic CHF (congestive heart failure) ([place])   . Arthritis   . Dyspnea   . Fever of unknown origin 03/19/2017  . Hyperlipidemia   . Hypertension   . Multifocal pneumonia   . Osteopenia   . Persistent atrial fibrillation ([place])   . Stroke [facility]) 2013    Past Surgical History:  Procedure Laterality Date  . ANKLE SURGERY    . APPENDECTOMY    . CHOLECYSTECTOMY    . HERNIA REPAIR     Esophagus  . JOINT REPLACEMENT     total- right partial- left  . MASTECTOMY PARTIAL / LUMPECTOMY  2012   left    There were no vitals filed for this visit.  Subjective Assessment - 11/15/17 1202    Subjective  Patient with some knee pain from walking more the last visit.  She reports that she is going to try to do better on her own at home    Currently in Pain?  No/denies                      [facility] Adult PT Treatment/Exercise - 11/15/17 0001      Transfers   Comments  had her do her own transfers in and out of car today, she usually asks for help or lets her caregiver do it, needed much time and some cues for safety      Ambulation/Gait   Gait Comments  gait with HHA x 200 feet      Self-Care   Self-Care  Other Self-Care Comments    Other Self-Care Comments   instructed  care giver in the independent gym program,, had her help the patient in the exercises and the adjustments of the machines.  She needed cues and assist.  They are going to try to do it on their own, Spoke with them on how to adjust, we decided on to do all exercises in sitting with tband and ankle weights               PT Short Term Goals - 06/05/17 1201      PT SHORT TERM GOAL #1   Title  independent with initial HEP    Status  Achieved        PT Long Term Goals - 11/15/17 1232      PT LONG TERM GOAL #1   Title  decrease timed up and go test to 25 seconds    Status  Partially Met      PT LONG TERM GOAL #2   Title  increased right LE strength to 4-/5    Status  Partially Met  PT LONG TERM GOAL #3   Title  walk 200 feet without rest using FWW    Status  Partially Met      PT LONG TERM GOAL #4   Title  no falls over a 6 week period    Status  Achieved            Plan - 11/15/17 1229    Clinical Impression Statement  Patient really has had a lot of ups and downs with this episode of care.  She has had a fall, pneumonia, has had issues with rain and cold weather with aches and pain.  Her overall goal was to be able to walk into [place], she isgoing to try that this weekend.  I spoke with her and truly feel that she does not do enough at home, she allows caregivers to do a lot for her and she is usually sitting in a chair.  She gave different reasons for not doing things like aches, fatigue and fear.    PT Next Visit Plan  we will d/c with most goals met    Consulted and Agree with Plan of Care  Patient       Patient will benefit from skilled therapeutic intervention in order to improve the following deficits and impairments:  Abnormal gait, Cardiopulmonary status limiting activity, Decreased activity tolerance, Decreased mobility, Decreased endurance, Decreased range of motion, Decreased strength, Difficulty walking, Impaired flexibility, Impaired sensation,  Improper body mechanics, Pain, Impaired UE functional use  Visit Diagnosis: Muscle weakness (generalized)  Repeated falls  Difficulty in walking, not elsewhere classified  Late effects of CVA (cerebrovascular accident)  Hemiplegia and hemiparesis following cerebral infarction affecting left non-dominant side [facility])     Problem List Patient Active Problem List   Diagnosis Date Noted  . Respiratory failure with hypoxia ([place]) 08/30/2017  . Exposure to influenza 08/09/2017  . Nausea without vomiting 05/10/2017  . Hypoxemia   . Heart failure with preserved ejection fraction ([place]), Grade 3 diastolic dysfunction 14/48/1856  . Atrial fibrillation with RVR ([place])   . Chronic diastolic CHF (congestive heart failure) ([place])   . PAF (paroxysmal atrial fibrillation) ([place])   . History of stroke   . Fever   . Metabolic syndrome 31/49/7026  . Encounter for preventive health examination 02/17/2016  . Morbid obesity ([place]) 06/17/2015  . Fall at home 12/01/2014  . Hypomagnesemia 04/24/2014  . Hemiparesis affecting left side as late effect of cerebrovascular accident ([place]) 04/24/2014  . Nontraumatic cerebral hemorrhage ([place]) 04/30/2012  . Diabetes type 2, controlled ([place]) 03/04/2010  . OSTEOPENIA 01/21/2009  . UNSPECIFIED VITAMIN D DEFICIENCY 11/19/2007  . ESSENTIAL HYPERTENSION, BENIGN 11/19/2007  . HYPERCHOLESTEROLEMIA 10/25/2006  . GASTROESOPHAGEAL REFLUX, NO ESOPHAGITIS 10/25/2006  . DIVERTICULOSIS OF COLON 10/25/2006  . Osteoarthritis 10/25/2006  . CERVICAL SPINE DISORDER, NOS 10/25/2006    Sumner Boast., PT 11/15/2017, 12:33 PM  [address] [place], [place], [address] Phone: [phone]   Fax:  [phone]  Name: Karen Jackson MRN: [id number] Date of Birth: 05-14-1941

## 2017-11-22 ENCOUNTER — Other Ambulatory Visit: Payer: Self-pay | Admitting: Family Medicine

## 2017-11-22 DIAGNOSIS — K219 Gastro-esophageal reflux disease without esophagitis: Secondary | ICD-10-CM

## 2017-12-10 ENCOUNTER — Telehealth: Payer: Self-pay | Admitting: *Deleted

## 2017-12-10 NOTE — Telephone Encounter (Signed)
Needed help filling our long term care form.  Completed form and faxed to insurer.

## 2017-12-10 NOTE — Telephone Encounter (Signed)
Message left on clinic nurse voice mail - Patient returning call to Dr. Andria Frames.  Thinks it may be about questions he had regarding her insurancre forms.  Message routed to Dr. Andria Frames.   Burna Forts, BSN, RN-BC

## 2017-12-19 ENCOUNTER — Other Ambulatory Visit: Payer: Self-pay | Admitting: Family Medicine

## 2017-12-19 NOTE — Telephone Encounter (Signed)
Error

## 2018-01-03 DIAGNOSIS — R350 Frequency of micturition: Secondary | ICD-10-CM | POA: Diagnosis not present

## 2018-01-09 ENCOUNTER — Other Ambulatory Visit: Payer: Self-pay

## 2018-01-09 ENCOUNTER — Ambulatory Visit (INDEPENDENT_AMBULATORY_CARE_PROVIDER_SITE_OTHER): Payer: Medicare Other | Admitting: Family Medicine

## 2018-01-09 ENCOUNTER — Encounter: Payer: Self-pay | Admitting: Family Medicine

## 2018-01-09 VITALS — BP 90/56 | HR 53 | Temp 98.5°F | Ht <= 58 in | Wt 179.8 lb

## 2018-01-09 DIAGNOSIS — J069 Acute upper respiratory infection, unspecified: Secondary | ICD-10-CM

## 2018-01-09 DIAGNOSIS — I5032 Chronic diastolic (congestive) heart failure: Secondary | ICD-10-CM | POA: Diagnosis not present

## 2018-01-09 NOTE — Assessment & Plan Note (Signed)
Patient's recent sick contact, lack of fever, and normal lung exam make a viral upper respiratory infection the most likely cause of her cough. Stable weight versus last visit and clear lung sounds bilaterally make volume overload 2/2 HF an unlikely cough etiology. - Discussed with patient the reassuring findings of lung exam which suggest "common cold" (versus pneumonia) as cause of cough - Suggested continued use of Mucinex for symptom relief - Instructed patient to call immediately if she develops increasing shortness of breath, fever, worsening cough; if patient reports these symptoms, will order CXR and/or start antibiotics at this time given increased risk for pneumonia

## 2018-01-09 NOTE — Progress Notes (Signed)
Date of Visit: 01/09/2018   HPI:  Patient presents for a same day appointment to discuss cough.  Cough  - Cough started on Sunday night and kept patient up that evening - Son was in town over weekend and was sick with a cold - Some improvement in cough on Monday afternoon/evening (50% better), but cough worsened again Tuesday and has remained troublesome since - Cough is productive of clear sputum; denies hemoptysis - Has been using oxygen during the day due to shortness of breath; usually only uses oxygen at night - Using Mucinex with some improvement in symptoms - Prescribed Lasix 80 mg daily for HF, but takes this 3-4x per week due to frequent urination caused by medication; last took Lasix on Monday (5/13) - Denies fever, chills, chest pain, orthopnea, PND, increased leg swelling - Endorses loss of appetite and feeling "under the weather"  ROS: See HPI  PMH:  Diastolic HF, Type 2 diabetes mellitus, hemorrhagic stroke (June 2013)  PHYSICAL EXAM: BP (!) 90/56   Pulse (!) 53   Temp 98.5 F (36.9 C) (Oral)   Ht 4\' 10"  (1.473 m)   Wt 179 lb 12.8 oz (81.6 kg) Comment: pt did not want to weigh  SpO2 96%   BMI 37.58 kg/m  Gen: well appearing, sitting comfortably in wheelchair HEENT: moist mucous membranes, no lymphadenopathy Heart: regular rhythm, normal S1/S2, no m/r/g Lungs: CTAB, normal work of breathing Neuro: left-sided weakness from previous stroke; speech normal Ext: no edema in right LE, trace edema in left LE  ASSESSMENT/PLAN:  Viral upper respiratory tract infection Patient's recent sick contact, lack of fever, and normal lung exam make a viral upper respiratory infection the most likely cause of her cough. Stable weight versus last visit and clear lung sounds bilaterally make volume overload 2/2 HF an unlikely cough etiology. - Discussed with patient the reassuring findings of lung exam which suggest "common cold" (versus pneumonia) as cause of cough - Suggested  continued use of Mucinex for symptom relief - Instructed patient to call immediately if she develops increasing shortness of breath, fever, worsening cough; if patient reports these symptoms, will order CXR and/or start antibiotics at this time given increased risk for pneumonia

## 2018-01-09 NOTE — Patient Instructions (Signed)
Thank you for coming early.  I think this is just a virus.  If you get worse, call and insist on talking to me.  I would either call in antibiotics, have you get a chest X ray or both.  You should do nothing but get better from this point.

## 2018-01-10 ENCOUNTER — Encounter: Payer: Self-pay | Admitting: Family Medicine

## 2018-01-10 NOTE — Assessment & Plan Note (Signed)
Seems well controled and at dry wt on current therapy.

## 2018-02-20 DIAGNOSIS — H6123 Impacted cerumen, bilateral: Secondary | ICD-10-CM | POA: Diagnosis not present

## 2018-02-27 DIAGNOSIS — Z1231 Encounter for screening mammogram for malignant neoplasm of breast: Secondary | ICD-10-CM | POA: Diagnosis not present

## 2018-03-04 ENCOUNTER — Encounter: Payer: Self-pay | Admitting: Family Medicine

## 2018-03-08 DIAGNOSIS — R921 Mammographic calcification found on diagnostic imaging of breast: Secondary | ICD-10-CM | POA: Diagnosis not present

## 2018-03-09 ENCOUNTER — Other Ambulatory Visit: Payer: Self-pay | Admitting: Family Medicine

## 2018-03-18 ENCOUNTER — Encounter: Payer: Self-pay | Admitting: Family Medicine

## 2018-04-07 ENCOUNTER — Other Ambulatory Visit: Payer: Self-pay | Admitting: Family Medicine

## 2018-05-01 DIAGNOSIS — M25572 Pain in left ankle and joints of left foot: Secondary | ICD-10-CM | POA: Diagnosis not present

## 2018-05-01 DIAGNOSIS — R600 Localized edema: Secondary | ICD-10-CM | POA: Diagnosis not present

## 2018-05-01 DIAGNOSIS — Z96652 Presence of left artificial knee joint: Secondary | ICD-10-CM | POA: Diagnosis not present

## 2018-05-01 DIAGNOSIS — M19072 Primary osteoarthritis, left ankle and foot: Secondary | ICD-10-CM | POA: Diagnosis not present

## 2018-05-01 DIAGNOSIS — M25562 Pain in left knee: Secondary | ICD-10-CM | POA: Diagnosis not present

## 2018-05-01 DIAGNOSIS — I5020 Unspecified systolic (congestive) heart failure: Secondary | ICD-10-CM | POA: Diagnosis not present

## 2018-05-01 DIAGNOSIS — Z79899 Other long term (current) drug therapy: Secondary | ICD-10-CM | POA: Diagnosis not present

## 2018-05-01 DIAGNOSIS — Z8781 Personal history of (healed) traumatic fracture: Secondary | ICD-10-CM | POA: Diagnosis not present

## 2018-05-01 DIAGNOSIS — Z8673 Personal history of transient ischemic attack (TIA), and cerebral infarction without residual deficits: Secondary | ICD-10-CM | POA: Diagnosis not present

## 2018-05-23 ENCOUNTER — Ambulatory Visit (INDEPENDENT_AMBULATORY_CARE_PROVIDER_SITE_OTHER): Payer: Medicare Other | Admitting: Family Medicine

## 2018-05-23 ENCOUNTER — Other Ambulatory Visit: Payer: Self-pay

## 2018-05-23 ENCOUNTER — Encounter: Payer: Self-pay | Admitting: Family Medicine

## 2018-05-23 VITALS — BP 120/54 | HR 52 | Temp 98.0°F | Ht <= 58 in | Wt 172.4 lb

## 2018-05-23 DIAGNOSIS — I69354 Hemiplegia and hemiparesis following cerebral infarction affecting left non-dominant side: Secondary | ICD-10-CM

## 2018-05-23 DIAGNOSIS — R0902 Hypoxemia: Secondary | ICD-10-CM

## 2018-05-23 DIAGNOSIS — M25572 Pain in left ankle and joints of left foot: Secondary | ICD-10-CM

## 2018-05-23 DIAGNOSIS — Z23 Encounter for immunization: Secondary | ICD-10-CM

## 2018-05-23 DIAGNOSIS — I4891 Unspecified atrial fibrillation: Secondary | ICD-10-CM

## 2018-05-23 DIAGNOSIS — Z0000 Encounter for general adult medical examination without abnormal findings: Secondary | ICD-10-CM | POA: Diagnosis not present

## 2018-05-23 DIAGNOSIS — E7800 Pure hypercholesterolemia, unspecified: Secondary | ICD-10-CM | POA: Diagnosis not present

## 2018-05-23 DIAGNOSIS — I5032 Chronic diastolic (congestive) heart failure: Secondary | ICD-10-CM

## 2018-05-23 DIAGNOSIS — M25579 Pain in unspecified ankle and joints of unspecified foot: Secondary | ICD-10-CM | POA: Insufficient documentation

## 2018-05-23 DIAGNOSIS — E118 Type 2 diabetes mellitus with unspecified complications: Secondary | ICD-10-CM | POA: Diagnosis not present

## 2018-05-23 DIAGNOSIS — E1121 Type 2 diabetes mellitus with diabetic nephropathy: Secondary | ICD-10-CM | POA: Diagnosis not present

## 2018-05-23 DIAGNOSIS — I10 Essential (primary) hypertension: Secondary | ICD-10-CM | POA: Diagnosis not present

## 2018-05-23 LAB — POCT GLYCOSYLATED HEMOGLOBIN (HGB A1C): HbA1c, POC (controlled diabetic range): 6.8 % (ref 0.0–7.0)

## 2018-05-23 NOTE — Patient Instructions (Addendum)
I will call with blood test results. Flu shot today. Great work on a better diet and weight loss.  No more prednisone for now.

## 2018-05-24 ENCOUNTER — Encounter: Payer: Self-pay | Admitting: Family Medicine

## 2018-05-24 LAB — CMP14+EGFR
A/G RATIO: 1.8 (ref 1.2–2.2)
ALT: 18 IU/L (ref 0–32)
AST: 16 IU/L (ref 0–40)
Albumin: 4.2 g/dL (ref 3.5–4.8)
Alkaline Phosphatase: 52 IU/L (ref 39–117)
BILIRUBIN TOTAL: 0.5 mg/dL (ref 0.0–1.2)
BUN / CREAT RATIO: 34 — AB (ref 12–28)
BUN: 20 mg/dL (ref 8–27)
CO2: 31 mmol/L — ABNORMAL HIGH (ref 20–29)
Calcium: 9.5 mg/dL (ref 8.7–10.3)
Chloride: 93 mmol/L — ABNORMAL LOW (ref 96–106)
Creatinine, Ser: 0.58 mg/dL (ref 0.57–1.00)
GFR calc non Af Amer: 89 mL/min/{1.73_m2} (ref >59)
GFR, EST AFRICAN AMERICAN: 103 mL/min/{1.73_m2} (ref >59)
GLOBULIN, TOTAL: 2.4 g/dL (ref 1.5–4.5)
Glucose: 117 mg/dL — ABNORMAL HIGH (ref 65–99)
POTASSIUM: 4.3 mmol/L (ref 3.5–5.2)
SODIUM: 138 mmol/L (ref 134–144)
TOTAL PROTEIN: 6.6 g/dL (ref 6.0–8.5)

## 2018-05-24 LAB — CBC
Hematocrit: 43.4 % (ref 34.0–46.6)
Hemoglobin: 14.8 g/dL (ref 11.1–15.9)
MCH: 32.5 pg (ref 26.6–33.0)
MCHC: 34.1 g/dL (ref 31.5–35.7)
MCV: 95 fL (ref 79–97)
PLATELETS: 287 10*3/uL (ref 150–450)
RBC: 4.56 x10E6/uL (ref 3.77–5.28)
RDW: 11.9 % — AB (ref 12.3–15.4)
WBC: 8.5 10*3/uL (ref 3.4–10.8)

## 2018-05-24 LAB — LIPID PANEL
CHOLESTEROL TOTAL: 219 mg/dL — AB (ref 100–199)
Chol/HDL Ratio: 2.5 ratio (ref 0.0–4.4)
HDL: 87 mg/dL (ref >39)
LDL CALC: 84 mg/dL (ref 0–99)
Triglycerides: 240 mg/dL — ABNORMAL HIGH (ref 0–149)
VLDL Cholesterol Cal: 48 mg/dL — ABNORMAL HIGH (ref 5–40)

## 2018-05-24 LAB — URIC ACID: URIC ACID: 5.1 mg/dL (ref 2.5–7.1)

## 2018-05-24 LAB — SEDIMENTATION RATE: SED RATE: 33 mm/h (ref 0–40)

## 2018-05-24 LAB — TSH: TSH: 0.902 u[IU]/mL (ref 0.450–4.500)

## 2018-05-24 NOTE — Assessment & Plan Note (Signed)
Seems stable at this point and euvolemic.

## 2018-05-24 NOTE — Assessment & Plan Note (Signed)
rAte controled.  Not on anticoagulant due to hemorrhagic CVA.

## 2018-05-24 NOTE — Assessment & Plan Note (Signed)
REcent decrease in ambulation due to arthritis flair.  Restart PT.

## 2018-05-24 NOTE — Progress Notes (Signed)
   Subjective:    Patient ID: Karen Jackson, female    DOB: April 28, 1941, 77 y.o.   MRN: [id number]  HPI Annual wellness exam plus problems.  Issues: Overviewe: Patient has been nicely stable since her hemorrhagic stroke.  She continues to live in her own home with aides.  She continues to need intermitant physical therapy to maintain ambulation.  Ambulation is becoming harder with her arthritis. 1. Osteoarthritis multiple joints.  Recent flair of pain in left foot, ankle and knee.  Has seen ortho.  They suggested that she be tested for gout.  No hx of gout but is on diuretic.  This pain has been a setback in her ambulation.   2. DM controled.  On statin.  Not on any antidiabetic meds.  A1C is 6.8.   3. Hypoxemia/CHF The hypoxemia is felt to be due to her diastolic CHF.  Now only uses home O2 at night.  No orthopnea.  No worsening of leg edema 4. CVA, late effects  Not on ASA or any blood thinner due to previous hemorrhagic CVA 5. Afib.  No palpitation or tachycardia felt on diltiazem.  Not on blood thinners due to previous hemorrhagic CVA. 6. Hypertension: good control and no side effects on current meds.     Review of Systems  DEnies CP, SOB, Change in bowel bladder or appetite.     Objective:   Physical Exam VS noted HEENT wnl Neck supple Lungs clear Cardiac RRR without m or g Abd benign EXt left hemiparesis.  Brace on left ankle.  Mild effusion of both knees left > right       Assessment & Plan:

## 2018-05-24 NOTE — Assessment & Plan Note (Signed)
Only on nocturnal O2

## 2018-05-24 NOTE — Assessment & Plan Note (Signed)
Up to date Flu shot today.  Repeat chol screen.

## 2018-05-24 NOTE — Assessment & Plan Note (Signed)
REpeat lipid panel

## 2018-05-24 NOTE — Assessment & Plan Note (Signed)
BMI=36 plus DM and HBP

## 2018-05-24 NOTE — Assessment & Plan Note (Signed)
Well controled on current meds 

## 2018-06-05 ENCOUNTER — Ambulatory Visit: Payer: Medicare Other | Attending: Family Medicine | Admitting: Physical Therapy

## 2018-06-05 ENCOUNTER — Encounter: Payer: Self-pay | Admitting: Physical Therapy

## 2018-06-05 DIAGNOSIS — M25572 Pain in left ankle and joints of left foot: Secondary | ICD-10-CM | POA: Diagnosis not present

## 2018-06-05 DIAGNOSIS — M25542 Pain in joints of left hand: Secondary | ICD-10-CM | POA: Diagnosis not present

## 2018-06-05 DIAGNOSIS — R262 Difficulty in walking, not elsewhere classified: Secondary | ICD-10-CM

## 2018-06-05 DIAGNOSIS — R252 Cramp and spasm: Secondary | ICD-10-CM

## 2018-06-05 DIAGNOSIS — I69354 Hemiplegia and hemiparesis following cerebral infarction affecting left non-dominant side: Secondary | ICD-10-CM | POA: Insufficient documentation

## 2018-06-05 DIAGNOSIS — I6990 Unspecified sequelae of unspecified cerebrovascular disease: Secondary | ICD-10-CM | POA: Diagnosis not present

## 2018-06-05 DIAGNOSIS — R278 Other lack of coordination: Secondary | ICD-10-CM | POA: Insufficient documentation

## 2018-06-05 DIAGNOSIS — G8929 Other chronic pain: Secondary | ICD-10-CM | POA: Insufficient documentation

## 2018-06-05 DIAGNOSIS — R270 Ataxia, unspecified: Secondary | ICD-10-CM | POA: Diagnosis not present

## 2018-06-05 DIAGNOSIS — R293 Abnormal posture: Secondary | ICD-10-CM | POA: Diagnosis not present

## 2018-06-05 DIAGNOSIS — R2242 Localized swelling, mass and lump, left lower limb: Secondary | ICD-10-CM | POA: Diagnosis not present

## 2018-06-05 DIAGNOSIS — M6281 Muscle weakness (generalized): Secondary | ICD-10-CM | POA: Diagnosis not present

## 2018-06-05 DIAGNOSIS — M25562 Pain in left knee: Secondary | ICD-10-CM | POA: Insufficient documentation

## 2018-06-05 DIAGNOSIS — R296 Repeated falls: Secondary | ICD-10-CM | POA: Insufficient documentation

## 2018-06-05 NOTE — Therapy (Signed)
[facility] [place], [place], [address] Phone: [phone]   Fax:  [phone]  Physical Therapy Evaluation  Patient Details  Name: Karen Jackson MRN: [id number] Date of Birth: [birth year] Referring Provider (PT): Hensel   Encounter Date: 06/05/2018  PT End of Session - 06/05/18 1137    Visit Number  1    Date for PT Re-Evaluation  08/05/18    PT Start Time  1057    PT Stop Time  1145    PT Time Calculation (min)  48 min    Activity Tolerance  Patient tolerated treatment well    Behavior During Therapy  [facility] for tasks assessed/performed       Past Medical History:  Diagnosis Date  . Acute cystitis without hematuria   . Acute diastolic CHF (congestive heart failure) ([place])   . Arthritis   . Dyspnea   . Fever of unknown origin 03/19/2017  . Hyperlipidemia   . Hypertension   . Multifocal pneumonia   . Osteopenia   . Persistent atrial fibrillation   . Stroke [facility]) 2013    Past Surgical History:  Procedure Laterality Date  . ANKLE SURGERY    . APPENDECTOMY    . CHOLECYSTECTOMY    . HERNIA REPAIR     Esophagus  . JOINT REPLACEMENT     total- right partial- left  . MASTECTOMY PARTIAL / LUMPECTOMY  2012   left    There were no vitals filed for this visit.   Subjective Assessment - 06/05/18 1100    Subjective  Patient is familiar to me with her past orthopedic surgeries and CVA.  She was last seen here in [place] due to weakness and difficulty walking.  She has been doing well since then but reports at the end of August reports that she started having increased left knee and ankle pain.  She rested from activity and theat seemed to make things worse, she had a recent cortisone injection.  The issue with Karen Jackson is al the co morbididites, once something happens she really regresses fast, we saw her earlier in the year due to a major set back in her ability to function after pneumonia.  Again after  these set backs her function really suffers to the point now that she only is walking to the bathroom all other mobility is in a wheelchair    Limitations  Standing;Walking    Patient Stated Goals  walk, have no pain    Currently in Pain?  Yes    Pain Score  8    left ankle   Pain Location  Knee    Pain Orientation  Left    Pain Descriptors / Indicators  Aching;Sore    Pain Type  Chronic pain    Pain Onset  More than a month ago    Pain Frequency  Constant    Aggravating Factors   worse in the AM, also with walking, pain up to 9/10    Pain Relieving Factors  ice and heat, as the day goes on it is a little better, pain at best a 6/10    Effect of Pain on Daily Activities  not walking         [facility] PT Assessment - 06/05/18 0001      Assessment   Medical Diagnosis  weakness, difficulty walking, left knee and ankle pain    Referring Provider (PT)  Hensel    Onset Date/Surgical Date  05/06/18    Hand Dominance  Right    Prior Therapy  Jan to March this year      Balance Screen   Has the patient fallen in the past 6 months  No    Has the patient had a decrease in activity level because of a fear of falling?   Yes    Is the patient reluctant to leave their home because of a fear of falling?   Yes      Home Environment   Additional Comments  ramp into the home, has an aide come in for 10 hours a day, lives alone      Prior Function   Level of Independence  Independent with household mobility with device;Needs assistance with homemaking;Independent with transfers      Posture/Postural Control   Posture Comments  fwd head, rounded shoulders, neglect of the left side, most of the time she does not know where the left arm is      ROM / Strength   AROM / PROM / Strength  AROM;Strength      AROM   AROM Assessment Site  Shoulder;Knee;Ankle    Right/Left Shoulder  Left    Left Shoulder Flexion  70 Degrees    Left Shoulder ABduction  60 Degrees    Left Shoulder Internal Rotation  20  Degrees    Left Shoulder External Rotation  60 Degrees    Right/Left Knee  Left    Left Knee Extension  0   pain   Left Knee Flexion  80   pain   Right/Left Ankle  Left    Left Ankle Dorsiflexion  5    Left Ankle Plantar Flexion  25    Left Ankle Inversion  10    Left Ankle Eversion  0      Strength   Overall Strength Comments  all resisted tests caused pain in the shoulder, left knee and the left ankle    Strength Assessment Site  Shoulder;Knee;Ankle    Right/Left Shoulder  Left    Left Shoulder Flexion  2/5    Left Shoulder ABduction  2-/5    Left Shoulder External Rotation  2-/5    Left Shoulder Horizontal ABduction  2-/5    Right/Left Knee  Left    Left Knee Flexion  2+/5    Left Knee Extension  3+/5    Right/Left Ankle  Left    Left Ankle Dorsiflexion  3/5    Left Ankle Plantar Flexion  3/5    Left Ankle Inversion  3/5    Left Ankle Eversion  0/5      Ambulation/Gait   Gait Comments  use of a FWW, very slow, neglect of the left side, shoulders very elevated, ataxic gait of the left leg with her locking it into extension, she has a difficult time picking up the foot, there is minimal DF, and minimal knee flexion to clear the foot.  She holds the right shoulder very high toward the ear, she has a lot of crepitus in the shoulders with walking      Standardized Balance Assessment   Standardized Balance Assessment  Timed Up and Go Test      Timed Up and Go Test   Normal TUG (seconds)  99                Objective measurements completed on examination: See above findings.  PT Short Term Goals - 06/05/18 1142      PT SHORT TERM GOAL #1   Title  independent with initial HEP    Time  2    Period  Weeks    Status  New        PT Long Term Goals - 06/05/18 1142      PT LONG TERM GOAL #1   Title  decrease timed up and go test to 40 seconds    Baseline  at evaluation 06/05/18 it was 99 seconds    Time  12    Period  Weeks    Status   New      PT LONG TERM GOAL #2   Title  increased right LE strength to 4-/5    Time  12    Period  Weeks    Status  New      PT LONG TERM GOAL #3   Title  walk 200 feet without rest using FWW    Time  12    Period  Weeks    Status  New      PT LONG TERM GOAL #4   Title  no falls over a 6 week period    Time  12    Period  Weeks    Status  New      PT LONG TERM GOAL #5   Title  report no difficulty pulling her pants up when going to the bathroom    Time  12    Period  Weeks    Status  New             Plan - 06/05/18 1138    Clinical Impression Statement  Patient with CVA with left hemiparesis late effects, shen she has a set back with her health her functional abilities really regress.  Earlier this year we were seeing her due to pneumonia that really set her back.  Today we are seeing her due to left knee and ankle pain, there is no specific cause that her or the doctors know of jsut DJD, arthritis.  She however has really regressed in her function.  Last year TUG was 65 at eval and down to 30 seconds as we saw her, today the TUG was 99 seconds.  She has having a lot of difficulty walking and transferring, the left leg is ataxic and she really has difficulty picking up the foot to advance the leg.  she really elevates the shoulder swiht walking and has severe bone on bone type crepitus with her gripping the walker and trying to advance it.    History and Personal Factors relevant to plan of care:  CVA, TKR, TSR, Ankle replacement    Clinical Presentation  Stable    Clinical Decision Making  Moderate    Rehab Potential  Fair    PT Frequency  2x / week    PT Duration  12 weeks    PT Treatment/Interventions  ADLs/Self Care Home Management;Cryotherapy;Electrical Stimulation;Iontophoresis 4mg /ml Dexamethasone;Moist Heat;Ultrasound;Gait training;Balance training;Therapeutic exercise;Therapeutic activities;Functional mobility training;Patient/family education;Manual techniques;Dry  needling    PT Next Visit Plan  slowly start her exercises and function    Consulted and Agree with Plan of Care  Patient       Patient will benefit from skilled therapeutic intervention in order to improve the following deficits and impairments:  Abnormal gait, Decreased coordination, Decreased range of motion, Difficulty walking, Cardiopulmonary status limiting activity, Decreased endurance, Increased muscle spasms, Impaired UE functional use,  Decreased activity tolerance, Pain, Decreased balance, Postural dysfunction, Decreased strength, Decreased mobility  Visit Diagnosis: Muscle weakness (generalized) - Plan: PT plan of care cert/re-cert  Difficulty in walking, not elsewhere classified - Plan: PT plan of care cert/re-cert  Late effects of CVA (cerebrovascular accident) - Plan: PT plan of care cert/re-cert  Hemiplegia and hemiparesis following cerebral infarction affecting left non-dominant side ([place]) - Plan: PT plan of care cert/re-cert  Cramp and spasm - Plan: PT plan of care cert/re-cert  Chronic pain of left knee - Plan: PT plan of care cert/re-cert  Pain in left ankle and joints of left foot - Plan: PT plan of care cert/re-cert     Problem List Patient Active Problem List   Diagnosis Date Noted  . Acute ankle pain 05/23/2018  . Respiratory failure with hypoxia ([place]) 08/30/2017  . Hypoxemia   . Heart failure with preserved ejection fraction ([place]), Grade 3 diastolic dysfunction 62/83/6629  . Atrial fibrillation with RVR ([place])   . PAF (paroxysmal atrial fibrillation) ([place])   . Metabolic syndrome 47/65/4650  . Encounter for preventive health examination 02/17/2016  . Morbid obesity ([facility]) 06/17/2015  . Hypomagnesemia 04/24/2014  . Hemiparesis affecting left side as late effect of cerebrovascular accident ([place]) 04/24/2014  . Nontraumatic cerebral hemorrhage ([place]) 04/30/2012  . DM (diabetes mellitus) with complications ([place]) 35/46/5681  . OSTEOPENIA 01/21/2009  .  UNSPECIFIED VITAMIN D DEFICIENCY 11/19/2007  . ESSENTIAL HYPERTENSION, BENIGN 11/19/2007  . HYPERCHOLESTEROLEMIA 10/25/2006  . GASTROESOPHAGEAL REFLUX, NO ESOPHAGITIS 10/25/2006  . DIVERTICULOSIS OF COLON 10/25/2006  . Osteoarthritis 10/25/2006  . CERVICAL SPINE DISORDER, NOS 10/25/2006    Sumner Boast., PT 06/05/2018, 11:50 AM  [address] [place], [place], [address] Phone: [phone]   Fax:  [phone]  Name: Karen Jackson MRN: [id number] Date of Birth: Jul 05, 1941

## 2018-06-11 ENCOUNTER — Ambulatory Visit: Payer: Medicare Other | Admitting: Physical Therapy

## 2018-06-11 ENCOUNTER — Encounter: Payer: Self-pay | Admitting: Physical Therapy

## 2018-06-11 DIAGNOSIS — M25562 Pain in left knee: Secondary | ICD-10-CM

## 2018-06-11 DIAGNOSIS — R262 Difficulty in walking, not elsewhere classified: Secondary | ICD-10-CM | POA: Diagnosis not present

## 2018-06-11 DIAGNOSIS — I6990 Unspecified sequelae of unspecified cerebrovascular disease: Secondary | ICD-10-CM | POA: Diagnosis not present

## 2018-06-11 DIAGNOSIS — R278 Other lack of coordination: Secondary | ICD-10-CM

## 2018-06-11 DIAGNOSIS — I69354 Hemiplegia and hemiparesis following cerebral infarction affecting left non-dominant side: Secondary | ICD-10-CM | POA: Diagnosis not present

## 2018-06-11 DIAGNOSIS — R252 Cramp and spasm: Secondary | ICD-10-CM | POA: Diagnosis not present

## 2018-06-11 DIAGNOSIS — R270 Ataxia, unspecified: Secondary | ICD-10-CM

## 2018-06-11 DIAGNOSIS — M6281 Muscle weakness (generalized): Secondary | ICD-10-CM | POA: Diagnosis not present

## 2018-06-11 DIAGNOSIS — M25572 Pain in left ankle and joints of left foot: Secondary | ICD-10-CM

## 2018-06-11 DIAGNOSIS — R2242 Localized swelling, mass and lump, left lower limb: Secondary | ICD-10-CM

## 2018-06-11 DIAGNOSIS — R296 Repeated falls: Secondary | ICD-10-CM

## 2018-06-11 DIAGNOSIS — R293 Abnormal posture: Secondary | ICD-10-CM

## 2018-06-11 DIAGNOSIS — M25542 Pain in joints of left hand: Secondary | ICD-10-CM

## 2018-06-11 DIAGNOSIS — G8929 Other chronic pain: Secondary | ICD-10-CM

## 2018-06-11 NOTE — Therapy (Signed)
[address] [place], [place], [address] Phone: [phone]   Fax:  [phone]  Physical Therapy Treatment  Patient Details  Name: Karen Jackson MRN: [id number] Date of Birth: [birth year] Referring Provider (PT): Hensel   Encounter Date: 06/11/2018  PT End of Session - 06/11/18 1159    Visit Number  2    Date for PT Re-Evaluation  08/05/18    PT Start Time  1008    PT Stop Time  1053    PT Time Calculation (min)  45 min    Activity Tolerance  Patient tolerated treatment well    Behavior During Therapy  [facility] for tasks assessed/performed       Past Medical History:  Diagnosis Date  . Acute cystitis without hematuria   . Acute diastolic CHF (congestive heart failure) ([place])   . Arthritis   . Dyspnea   . Fever of unknown origin 03/19/2017  . Hyperlipidemia   . Hypertension   . Multifocal pneumonia   . Osteopenia   . Persistent atrial fibrillation   . Stroke [facility]) 2013    Past Surgical History:  Procedure Laterality Date  . ANKLE SURGERY    . APPENDECTOMY    . CHOLECYSTECTOMY    . HERNIA REPAIR     Esophagus  . JOINT REPLACEMENT     total- right partial- left  . MASTECTOMY PARTIAL / LUMPECTOMY  2012   left    There were no vitals filed for this visit.  Subjective Assessment - 06/11/18 1012    Subjective  Patient reports that her left knee and ankle are really hurting more, reports that she feels like getting in and out of bed is causing the pain    Currently in Pain?  Yes    Pain Score  8     Pain Location  Knee    Pain Orientation  Left    Aggravating Factors   getting in and out of the bed                       [facility] Adult PT Treatment/Exercise - 06/11/18 0001      Transfers   Comments  I have spoken with Ms Perdue for about two years on her transfers, I feel that she is hurried and unsafe, she tends to torque the left knee and ankle, I tried to problem solve with her  about her transfer that she is having difficulty at home and she really needed Mod A to perform      Ambulation/Gait   Gait Comments  some walking with pre weight shifting to help with ankle and knee pain      Exercises   Exercises  Knee/Hip      Knee/Hip Exercises: Aerobic   Nustep  level 3 x 6 minutes      Knee/Hip Exercises: Seated   Other Seated Knee/Hip Exercises  ankle DF with yellow tband 2x15, ankle eversion no resisted with some assist, fott on sit fit working AROM of the ankle      Knee/Hip Exercises: Supine   Short Arc Quad Sets  2 sets;10 reps    Short Arc Quad Sets Limitations  5# cues to go slow and control the motion    Heel Slides  Left;2 sets;10 reps    Heel Slides Limitations  use of sliding board with mild assist    Bridges with [employer]  2 sets;10 reps  Other Supine Knee/Hip Exercises  hip abduction with sliding board and mild assist    Other Supine Knee/Hip Exercises  lower trunk rotaiton with feet on ball, upper trunk rotation with pool noodle, isometric abs with physio ball               PT Short Term Goals - 06/05/18 1142      PT SHORT TERM GOAL #1   Title  independent with initial HEP    Time  2    Period  Weeks    Status  New        PT Long Term Goals - 06/05/18 1142      PT LONG TERM GOAL #1   Title  decrease timed up and go test to 40 seconds    Baseline  at evaluation 06/05/18 it was 99 seconds    Time  12    Period  Weeks    Status  New      PT LONG TERM GOAL #2   Title  increased right LE strength to 4-/5    Time  12    Period  Weeks    Status  New      PT LONG TERM GOAL #3   Title  walk 200 feet without rest using FWW    Time  12    Period  Weeks    Status  New      PT LONG TERM GOAL #4   Title  no falls over a 6 week period    Time  12    Period  Weeks    Status  New      PT LONG TERM GOAL #5   Title  report no difficulty pulling her pants up when going to the bathroom    Time  12    Period  Weeks     Status  New            Plan - 06/11/18 1159    Clinical Impression Statement  Patient really having increased left knee and ankle pain, she has a very bad habit of plopping down when transferring and when she does this she twists and causes a valgus stress on the left knee.  She reports that it is due to her needing to sit.  She will need to work on transfers, the use of the left leg and safety    PT Next Visit Plan  slowly start her exercises and function    Consulted and Agree with Plan of Care  Patient       Patient will benefit from skilled therapeutic intervention in order to improve the following deficits and impairments:  Abnormal gait, Decreased coordination, Decreased range of motion, Difficulty walking, Cardiopulmonary status limiting activity, Decreased endurance, Increased muscle spasms, Impaired UE functional use, Decreased activity tolerance, Pain, Decreased balance, Postural dysfunction, Decreased strength, Decreased mobility  Visit Diagnosis: Muscle weakness (generalized)  Difficulty in walking, not elsewhere classified  Late effects of CVA (cerebrovascular accident)  Hemiplegia and hemiparesis following cerebral infarction affecting left non-dominant side (HCC)  Cramp and spasm  Chronic pain of left knee  Pain in left ankle and joints of left foot  Repeated falls  Ataxia, unspecified  Localized swelling, mass and lump, left lower limb  Other lack of coordination  Abnormal posture  Pain in joints of left hand     Problem List Patient Active Problem List   Diagnosis Date Noted  . Acute ankle pain 05/23/2018  . Respiratory  failure with hypoxia ([place]) 08/30/2017  . Hypoxemia   . Heart failure with preserved ejection fraction ([place]), Grade 3 diastolic dysfunction 69/62/9528  . Atrial fibrillation with RVR ([place])   . PAF (paroxysmal atrial fibrillation) ([place])   . Metabolic syndrome 41/32/4401  . Encounter for preventive health examination 02/17/2016   . Morbid obesity ([place]) 06/17/2015  . Hypomagnesemia 04/24/2014  . Hemiparesis affecting left side as late effect of cerebrovascular accident ([place]) 04/24/2014  . Nontraumatic cerebral hemorrhage ([place]) 04/30/2012  . DM (diabetes mellitus) with complications ([place]) 02/72/5366  . OSTEOPENIA 01/21/2009  . UNSPECIFIED VITAMIN D DEFICIENCY 11/19/2007  . ESSENTIAL HYPERTENSION, BENIGN 11/19/2007  . HYPERCHOLESTEROLEMIA 10/25/2006  . GASTROESOPHAGEAL REFLUX, NO ESOPHAGITIS 10/25/2006  . DIVERTICULOSIS OF COLON 10/25/2006  . Osteoarthritis 10/25/2006  . CERVICAL SPINE DISORDER, NOS 10/25/2006    Sumner Boast., PT 06/11/2018, 12:01 PM  [address] [place], [place], [address] Phone: [phone]   Fax:  [phone]  Name: Karen Jackson MRN: [id number] Date of Birth: 05/22/1941

## 2018-06-13 ENCOUNTER — Encounter: Payer: Self-pay | Admitting: Physical Therapy

## 2018-06-13 ENCOUNTER — Ambulatory Visit: Payer: Medicare Other | Admitting: Physical Therapy

## 2018-06-13 DIAGNOSIS — R262 Difficulty in walking, not elsewhere classified: Secondary | ICD-10-CM

## 2018-06-13 DIAGNOSIS — M6281 Muscle weakness (generalized): Secondary | ICD-10-CM | POA: Diagnosis not present

## 2018-06-13 DIAGNOSIS — I6990 Unspecified sequelae of unspecified cerebrovascular disease: Secondary | ICD-10-CM

## 2018-06-13 DIAGNOSIS — R296 Repeated falls: Secondary | ICD-10-CM

## 2018-06-13 DIAGNOSIS — I69354 Hemiplegia and hemiparesis following cerebral infarction affecting left non-dominant side: Secondary | ICD-10-CM | POA: Diagnosis not present

## 2018-06-13 DIAGNOSIS — R252 Cramp and spasm: Secondary | ICD-10-CM

## 2018-06-13 DIAGNOSIS — M25572 Pain in left ankle and joints of left foot: Secondary | ICD-10-CM

## 2018-06-13 DIAGNOSIS — G8929 Other chronic pain: Secondary | ICD-10-CM

## 2018-06-13 DIAGNOSIS — M25562 Pain in left knee: Secondary | ICD-10-CM

## 2018-06-13 DIAGNOSIS — R270 Ataxia, unspecified: Secondary | ICD-10-CM

## 2018-06-13 NOTE — Therapy (Signed)
[name] [address] [place], [place], [address] Phone: [phone]   Fax:  [phone]  Physical Therapy Treatment  Patient Details  Name: Karen Jackson MRN: [id number] Date of Birth: 11/21/1940 Referring Provider (PT): Hensel   Encounter Date: 06/13/2018  PT End of Session - 06/13/18 1159    Visit Number  3    Date for PT Re-Evaluation  08/05/18    PT Start Time  1059   heat   PT Stop Time  1156    PT Time Calculation (min)  57 min    Activity Tolerance  Patient tolerated treatment well    Behavior During Therapy  [facility] for tasks assessed/performed       Past Medical History:  Diagnosis Date  . Acute cystitis without hematuria   . Acute diastolic CHF (congestive heart failure) ([place])   . Arthritis   . Dyspnea   . Fever of unknown origin 03/19/2017  . Hyperlipidemia   . Hypertension   . Multifocal pneumonia   . Osteopenia   . Persistent atrial fibrillation   . Stroke [facility]) 2013    Past Surgical History:  Procedure Laterality Date  . ANKLE SURGERY    . APPENDECTOMY    . CHOLECYSTECTOMY    . HERNIA REPAIR     Esophagus  . JOINT REPLACEMENT     total- right partial- left  . MASTECTOMY PARTIAL / LUMPECTOMY  2012   left    There were no vitals filed for this visit.  Subjective Assessment - 06/13/18 1119    Subjective  Patient reports that her ankle has really been hurting especially in the morning    Currently in Pain?  Yes    Pain Score  8     Pain Location  Ankle    Pain Orientation  Left    Pain Descriptors / Indicators  Aching;Sore    Aggravating Factors   first thing in the morning with any weight beraing pain up to 8/10    Pain Relieving Factors  rest, no weight bearing pain can be 0/10                       [facility] Adult PT Treatment/Exercise - 06/13/18 0001      Transfers   Comments  I continued to  spoken with [name] about her transfers, I feel that she is hurried  and unsafe, she tends to torque the left knee and ankle, I tried to problem solve with her about her transfer that she is having difficulty at home and she really needed Mod A to perform, I really did more demostration with her about this and showed her how I feel she gets to a point and flops down causing the torque on th eknee and the ankle, she agreed but again from what I have seen in the past she does not change the behavior      Exercises   Exercises  Knee/Hip      Knee/Hip Exercises: Stretches   Gastroc Stretch  Left;4 reps;20 seconds      Knee/Hip Exercises: Aerobic   Nustep  level 3 x 6 minutes      Knee/Hip Exercises: Seated   Long Arc Quad  Both;2 sets;15 reps    Long Arc Quad Weight  5 lbs.    Other Seated Knee/Hip Exercises  foot on the sit fit ankle motions PF/DF, then Bosu behind her working on abs  Other Seated Knee/Hip Exercises  PF/DF ankle red tband 2x15, then some PROM of the left ankle with gentle approximation    Hamstring Curl  Left;2 sets;15 reps    Hamstring Limitations  red tband      Modalities   Modalities  Moist Heat      Moist Heat Therapy   Number Minutes Moist Heat  10 Minutes    Moist Heat Location  Shoulder;Knee;Ankle               PT Short Term Goals - 06/13/18 1206      PT SHORT TERM GOAL #1   Title  independent with initial HEP    Status  Partially Met        PT Long Term Goals - 06/05/18 1142      PT LONG TERM GOAL #1   Title  decrease timed up and go test to 40 seconds    Baseline  at evaluation 06/05/18 it was 99 seconds    Time  12    Period  Weeks    Status  New      PT LONG TERM GOAL #2   Title  increased right LE strength to 4-/5    Time  12    Period  Weeks    Status  New      PT LONG TERM GOAL #3   Title  walk 200 feet without rest using FWW    Time  12    Period  Weeks    Status  New      PT LONG TERM GOAL #4   Title  no falls over a 6 week period    Time  12    Period  Weeks    Status  New      PT  LONG TERM GOAL #5   Title  report no difficulty pulling her pants up when going to the bathroom    Time  12    Period  Weeks    Status  New            Plan - 06/13/18 1159    Clinical Impression Statement  Patient continues with the increased left knee and ankle pain therefor we are doing less weight bearing activities and doing some pre gait weight shifts when we do bear weight.  She truly has regressed with her function with increased left neglect and more pain and more dependence on the W/C and caregivers over th past 6 months    PT Next Visit Plan  continue to work on safe funtional transfers and increased independence    Consulted and Agree with Plan of Care  Patient       Patient will benefit from skilled therapeutic intervention in order to improve the following deficits and impairments:  Abnormal gait, Decreased coordination, Decreased range of motion, Difficulty walking, Cardiopulmonary status limiting activity, Decreased endurance, Increased muscle spasms, Impaired UE functional use, Decreased activity tolerance, Pain, Decreased balance, Postural dysfunction, Decreased strength, Decreased mobility  Visit Diagnosis: Muscle weakness (generalized)  Difficulty in walking, not elsewhere classified  Late effects of CVA (cerebrovascular accident)  Hemiplegia and hemiparesis following cerebral infarction affecting left non-dominant side (HCC)  Cramp and spasm  Chronic pain of left knee  Pain in left ankle and joints of left foot  Repeated falls  Ataxia, unspecified     Problem List Patient Active Problem List   Diagnosis Date Noted  . Acute ankle pain 05/23/2018  . Respiratory failure with  hypoxia ([place]) 08/30/2017  . Hypoxemia   . Heart failure with preserved ejection fraction ([place]), Grade 3 diastolic dysfunction 56/25/6389  . Atrial fibrillation with RVR ([place])   . PAF (paroxysmal atrial fibrillation) ([place])   . Metabolic syndrome 37/34/2876  . Encounter for  preventive health examination 02/17/2016  . Morbid obesity ([place]) 06/17/2015  . Hypomagnesemia 04/24/2014  . Hemiparesis affecting left side as late effect of cerebrovascular accident ([place]) 04/24/2014  . Nontraumatic cerebral hemorrhage ([place]) 04/30/2012  . DM (diabetes mellitus) with complications ([place]) 81/15/7262  . OSTEOPENIA 01/21/2009  . UNSPECIFIED VITAMIN D DEFICIENCY 11/19/2007  . ESSENTIAL HYPERTENSION, BENIGN 11/19/2007  . HYPERCHOLESTEROLEMIA 10/25/2006  . GASTROESOPHAGEAL REFLUX, NO ESOPHAGITIS 10/25/2006  . DIVERTICULOSIS OF COLON 10/25/2006  . Osteoarthritis 10/25/2006  . CERVICAL SPINE DISORDER, NOS 10/25/2006    Sumner Boast., PT 06/13/2018, 12:07 PM  [address] [place], [place], [address] Phone: [phone]   Fax:  [phone]  Name: Karen Jackson MRN: [id number] Date of Birth: October 25, 1940

## 2018-06-18 ENCOUNTER — Ambulatory Visit: Payer: Medicare Other | Admitting: Physical Therapy

## 2018-06-18 ENCOUNTER — Encounter: Payer: Self-pay | Admitting: Physical Therapy

## 2018-06-18 DIAGNOSIS — I69354 Hemiplegia and hemiparesis following cerebral infarction affecting left non-dominant side: Secondary | ICD-10-CM

## 2018-06-18 DIAGNOSIS — M6281 Muscle weakness (generalized): Secondary | ICD-10-CM

## 2018-06-18 DIAGNOSIS — R270 Ataxia, unspecified: Secondary | ICD-10-CM

## 2018-06-18 DIAGNOSIS — M25562 Pain in left knee: Secondary | ICD-10-CM

## 2018-06-18 DIAGNOSIS — R2242 Localized swelling, mass and lump, left lower limb: Secondary | ICD-10-CM

## 2018-06-18 DIAGNOSIS — G8929 Other chronic pain: Secondary | ICD-10-CM

## 2018-06-18 DIAGNOSIS — R296 Repeated falls: Secondary | ICD-10-CM

## 2018-06-18 DIAGNOSIS — R278 Other lack of coordination: Secondary | ICD-10-CM

## 2018-06-18 DIAGNOSIS — R262 Difficulty in walking, not elsewhere classified: Secondary | ICD-10-CM

## 2018-06-18 DIAGNOSIS — I6990 Unspecified sequelae of unspecified cerebrovascular disease: Secondary | ICD-10-CM

## 2018-06-18 DIAGNOSIS — M25572 Pain in left ankle and joints of left foot: Secondary | ICD-10-CM

## 2018-06-18 DIAGNOSIS — R252 Cramp and spasm: Secondary | ICD-10-CM

## 2018-06-18 NOTE — Therapy (Signed)
[address] [place], [place], [address] Phone: [phone]   Fax:  [phone]  Physical Therapy Treatment  Patient Details  Name: Karen Jackson MRN: [id number] Date of Birth: 01-27-1941 Referring Provider (PT): Hensel   Encounter Date: 06/18/2018  PT End of Session - 06/18/18 1049    Visit Number  4    Date for PT Re-Evaluation  08/05/18    PT Start Time  1014    PT Stop Time  1103    PT Time Calculation (min)  49 min    Activity Tolerance  Patient limited by pain    Behavior During Therapy  [facility] for tasks assessed/performed       Past Medical History:  Diagnosis Date  . Acute cystitis without hematuria   . Acute diastolic CHF (congestive heart failure) ([place])   . Arthritis   . Dyspnea   . Fever of unknown origin 03/19/2017  . Hyperlipidemia   . Hypertension   . Multifocal pneumonia   . Osteopenia   . Persistent atrial fibrillation   . Stroke [facility]) 2013    Past Surgical History:  Procedure Laterality Date  . ANKLE SURGERY    . APPENDECTOMY    . CHOLECYSTECTOMY    . HERNIA REPAIR     Esophagus  . JOINT REPLACEMENT     total- right partial- left  . MASTECTOMY PARTIAL / LUMPECTOMY  2012   left    There were no vitals filed for this visit.  Subjective Assessment - 06/18/18 1019    Subjective  Patient again very sore and tender in the ankle and knee on the left, reports worst in the AM with any weight bearing    Currently in Pain?  Yes    Pain Score  6     Pain Location  Ankle    Pain Orientation  Left    Pain Descriptors / Indicators  Aching;Sore    Aggravating Factors   worst in the morning    Pain Relieving Factors  better as the day goes                       [facility] Adult PT Treatment/Exercise - 06/18/18 0001      Transfers   Comments  worked with her again on transfers, how to do safely as she continues to put a significant valgus stress on the knee the way she  does it, she does have a lot of diffiuclty with this, we set it up as she instructed like her house and tried, she does seem to get stuck, making her feel like she has to twist and plop down again with the valgus stress, I tried to get her to use the walker which would allow her to move and turn fully, she is unsure if she has room      Ambulation/Gait   Gait Comments  some walking in the gym HHA, pre gait weight shift to help with pain      Knee/Hip Exercises: Stretches   Gastroc Stretch  Left;4 reps;20 seconds      Knee/Hip Exercises: Aerobic   Nustep  level 3 x 6 minutes      Knee/Hip Exercises: Seated   Long Arc Quad  Both;2 sets;15 reps    Long Arc Quad Weight  5 lbs.    Other Seated Knee/Hip Exercises  PF/DF ankle red tband 2x15, then some PROM of the left ankle with  gentle approximation    Hamstring Curl  Left;2 sets;15 reps    Hamstring Limitations  red tband      Modalities   Modalities  Cryotherapy;Electrical Stimulation;Vasopneumatic      Cryotherapy   Number Minutes Cryotherapy  10 Minutes    Cryotherapy Location  Knee    Type of Cryotherapy  Ice pack      Electrical Stimulation   Electrical Stimulation Location  left knee    Electrical Stimulation Action  IFC    Electrical Stimulation Parameters  supine in elevation    Electrical Stimulation Goals  Pain      Vasopneumatic   Number Minutes Vasopneumatic   10 minutes    Vasopnuematic Location   Ankle    Vasopneumatic Pressure  Medium    Vasopneumatic Temperature   35               PT Short Term Goals - 06/13/18 1206      PT SHORT TERM GOAL #1   Title  independent with initial HEP    Status  Partially Met        PT Long Term Goals - 06/05/18 1142      PT LONG TERM GOAL #1   Title  decrease timed up and go test to 40 seconds    Baseline  at evaluation 06/05/18 it was 99 seconds    Time  12    Period  Weeks    Status  New      PT LONG TERM GOAL #2   Title  increased right LE strength to 4-/5     Time  12    Period  Weeks    Status  New      PT LONG TERM GOAL #3   Title  walk 200 feet without rest using FWW    Time  12    Period  Weeks    Status  New      PT LONG TERM GOAL #4   Title  no falls over a 6 week period    Time  12    Period  Weeks    Status  New      PT LONG TERM GOAL #5   Title  report no difficulty pulling her pants up when going to the bathroom    Time  12    Period  Weeks    Status  New            Plan - 06/18/18 1049    Clinical Impression Statement  Pain in the left ankle and knee seem to really be hindering her mobility, function and quality of life, we have tried to problem solve the transfers which I think are contributing to her pain levles due to the torque that she puts on the knee and ankle.  tried some modalities for pain today'    PT Next Visit Plan  continue to work on safe funtional transfers and increased independence, see if the modalities helped her pain    Consulted and Agree with Plan of Care  Patient       Patient will benefit from skilled therapeutic intervention in order to improve the following deficits and impairments:  Abnormal gait, Decreased coordination, Decreased range of motion, Difficulty walking, Cardiopulmonary status limiting activity, Decreased endurance, Increased muscle spasms, Impaired UE functional use, Decreased activity tolerance, Pain, Decreased balance, Postural dysfunction, Decreased strength, Decreased mobility  Visit Diagnosis: Muscle weakness (generalized)  Difficulty in walking, not elsewhere classified  Late effects of CVA (cerebrovascular accident)  Hemiplegia and hemiparesis following cerebral infarction affecting left non-dominant side (HCC)  Cramp and spasm  Chronic pain of left knee  Pain in left ankle and joints of left foot  Repeated falls  Ataxia, unspecified  Localized swelling, mass and lump, left lower limb  Other lack of coordination     Problem List Patient Active  Problem List   Diagnosis Date Noted  . Acute ankle pain 05/23/2018  . Respiratory failure with hypoxia ([place]) 08/30/2017  . Hypoxemia   . Heart failure with preserved ejection fraction ([place]), Grade 3 diastolic dysfunction 71/69/6789  . Atrial fibrillation with RVR ([place])   . PAF (paroxysmal atrial fibrillation) ([place])   . Metabolic syndrome 38/05/1750  . Encounter for preventive health examination 02/17/2016  . Morbid obesity ([place]) 06/17/2015  . Hypomagnesemia 04/24/2014  . Hemiparesis affecting left side as late effect of cerebrovascular accident ([place]) 04/24/2014  . Nontraumatic cerebral hemorrhage ([place]) 04/30/2012  . DM (diabetes mellitus) with complications ([place]) 02/58/5277  . OSTEOPENIA 01/21/2009  . UNSPECIFIED VITAMIN D DEFICIENCY 11/19/2007  . ESSENTIAL HYPERTENSION, BENIGN 11/19/2007  . HYPERCHOLESTEROLEMIA 10/25/2006  . GASTROESOPHAGEAL REFLUX, NO ESOPHAGITIS 10/25/2006  . DIVERTICULOSIS OF COLON 10/25/2006  . Osteoarthritis 10/25/2006  . CERVICAL SPINE DISORDER, NOS 10/25/2006    Sumner Boast., PT 06/18/2018, 10:52 AM  [address] [place], [place], [address] Phone: [phone]   Fax:  [phone]  Name: Karen Jackson MRN: [id number] Date of Birth: 1940/11/01

## 2018-06-20 ENCOUNTER — Ambulatory Visit: Payer: Medicare Other | Admitting: Physical Therapy

## 2018-06-20 ENCOUNTER — Encounter: Payer: Self-pay | Admitting: Physical Therapy

## 2018-06-20 DIAGNOSIS — G8929 Other chronic pain: Secondary | ICD-10-CM

## 2018-06-20 DIAGNOSIS — M6281 Muscle weakness (generalized): Secondary | ICD-10-CM

## 2018-06-20 DIAGNOSIS — R296 Repeated falls: Secondary | ICD-10-CM

## 2018-06-20 DIAGNOSIS — I6990 Unspecified sequelae of unspecified cerebrovascular disease: Secondary | ICD-10-CM

## 2018-06-20 DIAGNOSIS — R262 Difficulty in walking, not elsewhere classified: Secondary | ICD-10-CM

## 2018-06-20 DIAGNOSIS — M25562 Pain in left knee: Secondary | ICD-10-CM | POA: Diagnosis not present

## 2018-06-20 DIAGNOSIS — R2242 Localized swelling, mass and lump, left lower limb: Secondary | ICD-10-CM

## 2018-06-20 DIAGNOSIS — R270 Ataxia, unspecified: Secondary | ICD-10-CM

## 2018-06-20 DIAGNOSIS — I69354 Hemiplegia and hemiparesis following cerebral infarction affecting left non-dominant side: Secondary | ICD-10-CM

## 2018-06-20 DIAGNOSIS — M25572 Pain in left ankle and joints of left foot: Secondary | ICD-10-CM

## 2018-06-20 DIAGNOSIS — R252 Cramp and spasm: Secondary | ICD-10-CM | POA: Diagnosis not present

## 2018-06-20 NOTE — Therapy (Signed)
[place] [address] [place], [place], [address] Phone: [phone]   Fax:  [phone]  Physical Therapy Treatment  Patient Details  Name: Karen Jackson MRN: [id number] Date of Birth: 04/01/1941 Referring Provider (PT): Hensel   Encounter Date: 06/20/2018  PT End of Session - 06/20/18 1301    Visit Number  5    Date for PT Re-Evaluation  08/05/18    PT Start Time  1014    PT Stop Time  1100    PT Time Calculation (min)  46 min    Activity Tolerance  Patient limited by pain    Behavior During Therapy  [facility] for tasks assessed/performed       Past Medical History:  Diagnosis Date  . Acute cystitis without hematuria   . Acute diastolic CHF (congestive heart failure) ([place])   . Arthritis   . Dyspnea   . Fever of unknown origin 03/19/2017  . Hyperlipidemia   . Hypertension   . Multifocal pneumonia   . Osteopenia   . Persistent atrial fibrillation   . Stroke [facility]) 2013    Past Surgical History:  Procedure Laterality Date  . ANKLE SURGERY    . APPENDECTOMY    . CHOLECYSTECTOMY    . HERNIA REPAIR     Esophagus  . JOINT REPLACEMENT     total- right partial- left  . MASTECTOMY PARTIAL / LUMPECTOMY  2012   left    There were no vitals filed for this visit.  Subjective Assessment - 06/20/18 1022    Subjective  Patient continues to reports very painful in the left ankle in the AM 8/10, gets better as the day goes on the ankle gets better and the knee gets worse with any weight bearing    Currently in Pain?  Yes    Pain Score  8     Pain Location  Ankle    Pain Orientation  Left    Pain Descriptors / Indicators  Aching;Sore    Aggravating Factors   weight bearing    Pain Relieving Factors  rest                       [facility] Adult PT Treatment/Exercise - 06/20/18 0001      Transfers   Comments  continued work and instruction on her transfers for safety  (Pended)       Ambulation/Gait    Gait Comments  some walking in the gym HHA, pre gait weight shift to help with pain  (Pended)       Knee/Hip Exercises: Stretches   Gastroc Stretch  Left;4 reps;20 seconds  (Pended)       Knee/Hip Exercises: Aerobic   Nustep  level 3 x 6 minutes      Knee/Hip Exercises: Seated   Long Arc Quad  Both;2 sets;15 reps  (Pended)     Long Arc Quad Weight  5 lbs.  (Pended)     Other Seated Knee/Hip Exercises  foot on the sit fit ankle motions PF/DF, then Bosu behind her working on abs  (Pended)     Hamstring Curl  Left;2 sets;15 reps  (Pended)     Hamstring Limitations  red tband  (Pended)       Knee/Hip Exercises: Supine   Short Arc Quad Sets  2 sets;10 reps  (Pended)     Short Arc Quad Sets Limitations  4# cues for slow less ataxic motions  (  Pended)     Heel Slides  Left;2 sets;10 reps  (Pended)     Heel Slides Limitations  use of sliding board with mild assist  (Pended)     Bridges with [employer]  2 sets;10 reps  (Pended)       Vasopneumatic   Number Minutes Vasopneumatic   10 minutes  (Pended)     Vasopnuematic Location   Ankle  (Pended)     Vasopneumatic Pressure  Medium  (Pended)     Vasopneumatic Temperature   35  (Pended)                PT Short Term Goals - 06/13/18 1206      PT SHORT TERM GOAL #1   Title  independent with initial HEP    Status  Partially Met        PT Long Term Goals - 06/20/18 1303      PT LONG TERM GOAL #1   Title  decrease timed up and go test to 40 seconds    Status  On-going      PT LONG TERM GOAL #2   Title  increased right LE strength to 4-/5    Status  On-going            Plan - 06/20/18 1301    Clinical Impression Statement  Patient still with significant pain in the left ankle and knee.  This seems to improve with gentle motions, weight bearing increases the pain and the pain is worse in the AM, she has very bad habits of rushing and not turning fully around and twisting the ankle and the knee    PT Next Visit Plan   continue to work on safe funtional transfers and increased independence, see if the modalities helped her pain    Consulted and Agree with Plan of Care  Patient       Patient will benefit from skilled therapeutic intervention in order to improve the following deficits and impairments:  Abnormal gait, Decreased coordination, Decreased range of motion, Difficulty walking, Cardiopulmonary status limiting activity, Decreased endurance, Increased muscle spasms, Impaired UE functional use, Decreased activity tolerance, Pain, Decreased balance, Postural dysfunction, Decreased strength, Decreased mobility  Visit Diagnosis: Muscle weakness (generalized)  Difficulty in walking, not elsewhere classified  Late effects of CVA (cerebrovascular accident)  Hemiplegia and hemiparesis following cerebral infarction affecting left non-dominant side (HCC)  Chronic pain of left knee  Pain in left ankle and joints of left foot  Repeated falls  Ataxia, unspecified  Localized swelling, mass and lump, left lower limb     Problem List Patient Active Problem List   Diagnosis Date Noted  . Acute ankle pain 05/23/2018  . Respiratory failure with hypoxia ([place]) 08/30/2017  . Hypoxemia   . Heart failure with preserved ejection fraction ([place]), Grade 3 diastolic dysfunction 96/28/3662  . Atrial fibrillation with RVR ([place])   . PAF (paroxysmal atrial fibrillation) ([place])   . Metabolic syndrome 94/76/5465  . Encounter for preventive health examination 02/17/2016  . Morbid obesity ([place]) 06/17/2015  . Hypomagnesemia 04/24/2014  . Hemiparesis affecting left side as late effect of cerebrovascular accident ([place]) 04/24/2014  . Nontraumatic cerebral hemorrhage ([place]) 04/30/2012  . DM (diabetes mellitus) with complications ([place]) 03/54/6568  . OSTEOPENIA 01/21/2009  . UNSPECIFIED VITAMIN D DEFICIENCY 11/19/2007  . ESSENTIAL HYPERTENSION, BENIGN 11/19/2007  . HYPERCHOLESTEROLEMIA 10/25/2006  . GASTROESOPHAGEAL  REFLUX, NO ESOPHAGITIS 10/25/2006  . DIVERTICULOSIS OF COLON 10/25/2006  . Osteoarthritis 10/25/2006  . CERVICAL  SPINE DISORDER, NOS 10/25/2006    Sumner Boast., PT 06/20/2018, 1:04 PM  [place] [name] [place], [place], [address] Phone: [phone]   Fax:  [phone]  Name: Karen Jackson MRN: [id number] Date of Birth: June 26, 1941

## 2018-06-25 ENCOUNTER — Ambulatory Visit: Payer: Medicare Other | Admitting: Physical Therapy

## 2018-06-25 ENCOUNTER — Encounter: Payer: Self-pay | Admitting: Physical Therapy

## 2018-06-25 DIAGNOSIS — I6990 Unspecified sequelae of unspecified cerebrovascular disease: Secondary | ICD-10-CM

## 2018-06-25 DIAGNOSIS — R2242 Localized swelling, mass and lump, left lower limb: Secondary | ICD-10-CM

## 2018-06-25 DIAGNOSIS — M25562 Pain in left knee: Secondary | ICD-10-CM

## 2018-06-25 DIAGNOSIS — M6281 Muscle weakness (generalized): Secondary | ICD-10-CM

## 2018-06-25 DIAGNOSIS — I69354 Hemiplegia and hemiparesis following cerebral infarction affecting left non-dominant side: Secondary | ICD-10-CM | POA: Diagnosis not present

## 2018-06-25 DIAGNOSIS — G8929 Other chronic pain: Secondary | ICD-10-CM

## 2018-06-25 DIAGNOSIS — R262 Difficulty in walking, not elsewhere classified: Secondary | ICD-10-CM

## 2018-06-25 DIAGNOSIS — M25572 Pain in left ankle and joints of left foot: Secondary | ICD-10-CM

## 2018-06-25 DIAGNOSIS — R270 Ataxia, unspecified: Secondary | ICD-10-CM

## 2018-06-25 DIAGNOSIS — R296 Repeated falls: Secondary | ICD-10-CM

## 2018-06-25 DIAGNOSIS — R252 Cramp and spasm: Secondary | ICD-10-CM | POA: Diagnosis not present

## 2018-06-25 NOTE — Therapy (Signed)
[address] [place], [place], [address] Phone: [phone]   Fax:  [phone]  Physical Therapy Treatment  Patient Details  Name: Karen Jackson MRN: [id number] Date of Birth: 23-Sep-1940 Referring Provider (PT): Hensel   Encounter Date: 06/25/2018  PT End of Session - 06/25/18 1057    Visit Number  6    Date for PT Re-Evaluation  08/05/18    PT Start Time  1009    PT Stop Time  1053    PT Time Calculation (min)  44 min    Activity Tolerance  Patient limited by pain    Behavior During Therapy  [facility] for tasks assessed/performed       Past Medical History:  Diagnosis Date  . Acute cystitis without hematuria   . Acute diastolic CHF (congestive heart failure) ([place])   . Arthritis   . Dyspnea   . Fever of unknown origin 03/19/2017  . Hyperlipidemia   . Hypertension   . Multifocal pneumonia   . Osteopenia   . Persistent atrial fibrillation   . Stroke [facility]) 2013    Past Surgical History:  Procedure Laterality Date  . ANKLE SURGERY    . APPENDECTOMY    . CHOLECYSTECTOMY    . HERNIA REPAIR     Esophagus  . JOINT REPLACEMENT     total- right partial- left  . MASTECTOMY PARTIAL / LUMPECTOMY  2012   left    There were no vitals filed for this visit.  Subjective Assessment - 06/25/18 1013    Subjective  Patient reports that she was doing better over the weekend with transfers, less pain, reports that yesterday and today she is now having more pain in the ankle and the knee    Currently in Pain?  Yes    Pain Location  Ankle   and the left knee   Pain Orientation  Left    Pain Descriptors / Indicators  Aching;Sore    Aggravating Factors   weather, weight bearing                       [facility] Adult PT Treatment/Exercise - 06/25/18 0001      Ambulation/Gait   Gait Comments  walking with HHA x30 feet, then 50 feet and then 60 feet with some cues for step lengtha nd a lot of cues for  transfer safety      Exercises   Exercises  Knee/Hip      Knee/Hip Exercises: Aerobic   Nustep  level 4 x 6 minutes      Knee/Hip Exercises: Machines for Strengthening   Cybex Knee Flexion  10# left only 3x10      Knee/Hip Exercises: Seated   Long Arc Quad  Both;2 sets;15 reps    Long Arc Quad Weight  5 lbs.    Ball Squeeze  20 reps    Other Seated Knee/Hip Exercises  foot on the sit fit ankle motions PF/DF, then Bosu behind her working on abs, 5# SLR in sitting with foot resting on a 4" step    Other Seated Knee/Hip Exercises  PF/DF ankle red tband 2x15, then some PROM of the left ankle with gentle approximation, yellow tband left shoulder retraction    Marching  Both;2 sets;10 reps    Hamstring Curl  Left;2 sets;15 reps    Hamstring Limitations  red tband      Knee/Hip Exercises: Supine   Bridges with  Ball Squeeze  2 sets;10 reps               PT Short Term Goals - 06/13/18 1206      PT SHORT TERM GOAL #1   Title  independent with initial HEP    Status  Partially Met        PT Long Term Goals - 06/20/18 1303      PT LONG TERM GOAL #1   Title  decrease timed up and go test to 40 seconds    Status  On-going      PT LONG TERM GOAL #2   Title  increased right LE strength to 4-/5    Status  On-going            Plan - 06/25/18 1132    Clinical Impression Statement  Patient having increased left ankle, knee and shoulder pain today.  She reports that she had a really good weekend and transfered without difficulty until yesterday she started having the pain.  She reports that she is no longer walking outside of the home, using W/C, she walks max distance of 15 feet in the home with a FWW.  She did well today ith the walking using HHA and cues for transfers.    PT Next Visit Plan  work on functional activities    Consulted and Agree with Plan of Care  Patient       Patient will benefit from skilled therapeutic intervention in order to improve the following  deficits and impairments:  Abnormal gait, Decreased coordination, Decreased range of motion, Difficulty walking, Cardiopulmonary status limiting activity, Decreased endurance, Increased muscle spasms, Impaired UE functional use, Decreased activity tolerance, Pain, Decreased balance, Postural dysfunction, Decreased strength, Decreased mobility  Visit Diagnosis: Muscle weakness (generalized)  Difficulty in walking, not elsewhere classified  Late effects of CVA (cerebrovascular accident)  Hemiplegia and hemiparesis following cerebral infarction affecting left non-dominant side (HCC)  Chronic pain of left knee  Pain in left ankle and joints of left foot  Repeated falls  Ataxia, unspecified  Localized swelling, mass and lump, left lower limb     Problem List Patient Active Problem List   Diagnosis Date Noted  . Acute ankle pain 05/23/2018  . Respiratory failure with hypoxia ([place]) 08/30/2017  . Hypoxemia   . Heart failure with preserved ejection fraction ([place]), Grade 3 diastolic dysfunction 25/75/0518  . Atrial fibrillation with RVR ([place])   . PAF (paroxysmal atrial fibrillation) ([place])   . Metabolic syndrome 33/58/2518  . Encounter for preventive health examination 02/17/2016  . Morbid obesity ([place]) 06/17/2015  . Hypomagnesemia 04/24/2014  . Hemiparesis affecting left side as late effect of cerebrovascular accident ([place]) 04/24/2014  . Nontraumatic cerebral hemorrhage ([place]) 04/30/2012  . DM (diabetes mellitus) with complications ([place]) 98/42/1031  . OSTEOPENIA 01/21/2009  . UNSPECIFIED VITAMIN D DEFICIENCY 11/19/2007  . ESSENTIAL HYPERTENSION, BENIGN 11/19/2007  . HYPERCHOLESTEROLEMIA 10/25/2006  . GASTROESOPHAGEAL REFLUX, NO ESOPHAGITIS 10/25/2006  . DIVERTICULOSIS OF COLON 10/25/2006  . Osteoarthritis 10/25/2006  . CERVICAL SPINE DISORDER, NOS 10/25/2006    Sumner Boast., PT 06/25/2018, 11:35 AM  [address] [address] [address] [place], [place], [address] Phone: [phone]   Fax:  [phone]  Name: PALMER SHOREY MRN: [id number] Date of Birth: 09/25/1940

## 2018-06-27 ENCOUNTER — Encounter: Payer: Self-pay | Admitting: Physical Therapy

## 2018-06-27 ENCOUNTER — Ambulatory Visit: Payer: Medicare Other | Admitting: Physical Therapy

## 2018-06-27 DIAGNOSIS — R2242 Localized swelling, mass and lump, left lower limb: Secondary | ICD-10-CM

## 2018-06-27 DIAGNOSIS — R262 Difficulty in walking, not elsewhere classified: Secondary | ICD-10-CM

## 2018-06-27 DIAGNOSIS — R252 Cramp and spasm: Secondary | ICD-10-CM

## 2018-06-27 DIAGNOSIS — M25572 Pain in left ankle and joints of left foot: Secondary | ICD-10-CM

## 2018-06-27 DIAGNOSIS — I69354 Hemiplegia and hemiparesis following cerebral infarction affecting left non-dominant side: Secondary | ICD-10-CM | POA: Diagnosis not present

## 2018-06-27 DIAGNOSIS — G8929 Other chronic pain: Secondary | ICD-10-CM

## 2018-06-27 DIAGNOSIS — R296 Repeated falls: Secondary | ICD-10-CM

## 2018-06-27 DIAGNOSIS — M6281 Muscle weakness (generalized): Secondary | ICD-10-CM | POA: Diagnosis not present

## 2018-06-27 DIAGNOSIS — M25562 Pain in left knee: Secondary | ICD-10-CM

## 2018-06-27 DIAGNOSIS — I6990 Unspecified sequelae of unspecified cerebrovascular disease: Secondary | ICD-10-CM

## 2018-06-27 DIAGNOSIS — R270 Ataxia, unspecified: Secondary | ICD-10-CM

## 2018-06-27 NOTE — Therapy (Signed)
[address] [place], [place], [address] Phone: [phone]   Fax:  [phone]  Physical Therapy Treatment  Patient Details  Name: Karen Jackson MRN: [id number] Date of Birth: [birth year] Referring Provider (PT): Hensel   Encounter Date: 06/27/2018  PT End of Session - 06/27/18 1302    Visit Number  7    Date for PT Re-Evaluation  08/05/18    PT Start Time  1011    PT Stop Time  1100    PT Time Calculation (min)  49 min    Activity Tolerance  Patient limited by pain    Behavior During Therapy  [facility] for tasks assessed/performed       Past Medical History:  Diagnosis Date  . Acute cystitis without hematuria   . Acute diastolic CHF (congestive heart failure) ([place])   . Arthritis   . Dyspnea   . Fever of unknown origin 03/19/2017  . Hyperlipidemia   . Hypertension   . Multifocal pneumonia   . Osteopenia   . Persistent atrial fibrillation   . Stroke [facility]) 2013    Past Surgical History:  Procedure Laterality Date  . ANKLE SURGERY    . APPENDECTOMY    . CHOLECYSTECTOMY    . HERNIA REPAIR     Esophagus  . JOINT REPLACEMENT     total- right partial- left  . MASTECTOMY PARTIAL / LUMPECTOMY  2012   left    There were no vitals filed for this visit.  Subjective Assessment - 06/27/18 1157    Subjective  Patient reports that the rain is really causing some increase of pain in the left knee, howver with palpation it seems to be a tight left HS that is the issue    Currently in Pain?  Yes    Pain Score  7     Pain Location  Knee    Pain Orientation  Left;Posterior    Aggravating Factors   weather, walking                       [facility] Adult PT Treatment/Exercise - 06/27/18 0001      Ambulation/Gait   Gait Comments  some walking HHA, needing cues to weight shift to help decrease the pain in the knee and the ankle, 3x30 feet and then 1x 60 feet      Knee/Hip Exercises: Stretches   Gastroc Stretch  Left;4 reps;20 seconds      Knee/Hip Exercises: Aerobic   Nustep  level 4 x 6 minutes      Knee/Hip Exercises: Machines for Strengthening   Cybex Knee Flexion  10# left only 3x10      Knee/Hip Exercises: Seated   Long Arc Quad  Both;2 sets;15 reps    Long Arc Quad Weight  5 lbs.    Other Seated Knee/Hip Exercises  foot on the sit fit ankle motions PF/DF, then Bosu behind her working on abs, 5# SLR in sitting with foot resting on a 4" step    Other Seated Knee/Hip Exercises  PF/DF ankle red tband 2x15, then some PROM of the left ankle with gentle approximation, yellow tband left shoulder retraction    Marching  Both;2 sets;10 reps      Knee/Hip Exercises: Supine   Short Arc Quad Sets  2 sets;10 reps    Short Arc Quad Sets Limitations  4#    Heel Slides  Left;2 sets;10 reps  Heel Slides Limitations  use of sliding board with mild assist               PT Short Term Goals - 06/13/18 1206      PT SHORT TERM GOAL #1   Title  independent with initial HEP    Status  Partially Met        PT Long Term Goals - 06/27/18 1308      PT LONG TERM GOAL #1   Title  decrease timed up and go test to 40 seconds    Status  On-going      PT LONG TERM GOAL #2   Title  increased right LE strength to 4-/5    Status  On-going      PT LONG TERM GOAL #3   Title  walk 200 feet without rest using FWW    Status  On-going      PT LONG TERM GOAL #4   Title  no falls over a 6 week period    Status  On-going            Plan - 06/27/18 1306    Clinical Impression Statement  Patient continues to have pain in the left knee and ankle, it seemed to be more in the HS area.  She was tender here.  The weather seems to affect her some.      PT Next Visit Plan  work on pain and function    Consulted and Agree with Plan of Care  Patient       Patient will benefit from skilled therapeutic intervention in order to improve the following deficits and impairments:  Abnormal  gait, Decreased coordination, Decreased range of motion, Difficulty walking, Cardiopulmonary status limiting activity, Decreased endurance, Increased muscle spasms, Impaired UE functional use, Decreased activity tolerance, Pain, Decreased balance, Postural dysfunction, Decreased strength, Decreased mobility  Visit Diagnosis: Muscle weakness (generalized)  Difficulty in walking, not elsewhere classified  Late effects of CVA (cerebrovascular accident)  Hemiplegia and hemiparesis following cerebral infarction affecting left non-dominant side (HCC)  Chronic pain of left knee  Pain in left ankle and joints of left foot  Repeated falls  Ataxia, unspecified  Localized swelling, mass and lump, left lower limb  Cramp and spasm     Problem List Patient Active Problem List   Diagnosis Date Noted  . Acute ankle pain 05/23/2018  . Respiratory failure with hypoxia ([place]) 08/30/2017  . Hypoxemia   . Heart failure with preserved ejection fraction ([place]), Grade 3 diastolic dysfunction 29/92/4268  . Atrial fibrillation with RVR ([place])   . PAF (paroxysmal atrial fibrillation) ([place])   . Metabolic syndrome 34/19/6222  . Encounter for preventive health examination 02/17/2016  . Morbid obesity ([place]) 06/17/2015  . Hypomagnesemia 04/24/2014  . Hemiparesis affecting left side as late effect of cerebrovascular accident ([place]) 04/24/2014  . Nontraumatic cerebral hemorrhage ([place]) 04/30/2012  . DM (diabetes mellitus) with complications ([place]) 97/98/9211  . OSTEOPENIA 01/21/2009  . UNSPECIFIED VITAMIN D DEFICIENCY 11/19/2007  . ESSENTIAL HYPERTENSION, BENIGN 11/19/2007  . HYPERCHOLESTEROLEMIA 10/25/2006  . GASTROESOPHAGEAL REFLUX, NO ESOPHAGITIS 10/25/2006  . DIVERTICULOSIS OF COLON 10/25/2006  . Osteoarthritis 10/25/2006  . CERVICAL SPINE DISORDER, NOS 10/25/2006    Sumner Boast., PT 06/27/2018, 1:09 PM  [address]  [place], [place], [address] Phone: [phone]   Fax:  [phone]  Name: Karen Jackson MRN: [id number] Date of Birth: 1940/09/23

## 2018-07-01 ENCOUNTER — Ambulatory Visit: Payer: Medicare Other | Attending: Family Medicine | Admitting: Physical Therapy

## 2018-07-01 ENCOUNTER — Encounter: Payer: Self-pay | Admitting: Physical Therapy

## 2018-07-01 DIAGNOSIS — M6281 Muscle weakness (generalized): Secondary | ICD-10-CM | POA: Insufficient documentation

## 2018-07-01 DIAGNOSIS — M25562 Pain in left knee: Secondary | ICD-10-CM | POA: Diagnosis not present

## 2018-07-01 DIAGNOSIS — I6990 Unspecified sequelae of unspecified cerebrovascular disease: Secondary | ICD-10-CM | POA: Insufficient documentation

## 2018-07-01 DIAGNOSIS — G8929 Other chronic pain: Secondary | ICD-10-CM | POA: Diagnosis not present

## 2018-07-01 DIAGNOSIS — I69354 Hemiplegia and hemiparesis following cerebral infarction affecting left non-dominant side: Secondary | ICD-10-CM | POA: Insufficient documentation

## 2018-07-01 DIAGNOSIS — R270 Ataxia, unspecified: Secondary | ICD-10-CM | POA: Diagnosis not present

## 2018-07-01 DIAGNOSIS — R262 Difficulty in walking, not elsewhere classified: Secondary | ICD-10-CM | POA: Diagnosis not present

## 2018-07-01 DIAGNOSIS — R252 Cramp and spasm: Secondary | ICD-10-CM | POA: Insufficient documentation

## 2018-07-01 DIAGNOSIS — R2242 Localized swelling, mass and lump, left lower limb: Secondary | ICD-10-CM | POA: Diagnosis not present

## 2018-07-01 DIAGNOSIS — R296 Repeated falls: Secondary | ICD-10-CM | POA: Diagnosis not present

## 2018-07-01 DIAGNOSIS — M25572 Pain in left ankle and joints of left foot: Secondary | ICD-10-CM | POA: Diagnosis not present

## 2018-07-01 NOTE — Therapy (Signed)
[address] [place], [place], [address] Phone: [phone]   Fax:  [phone]  Physical Therapy Treatment  Patient Details  Name: Karen Jackson MRN: [id number] Date of Birth: 15-Oct-1940 Referring Provider (PT): Hensel   Encounter Date: 07/01/2018  PT End of Session - 07/01/18 1251    Visit Number  8    Date for PT Re-Evaluation  08/05/18    PT Start Time  1055    PT Stop Time  1136    PT Time Calculation (min)  41 min    Activity Tolerance  Patient limited by pain    Behavior During Therapy  [facility] for tasks assessed/performed       Past Medical History:  Diagnosis Date  . Acute cystitis without hematuria   . Acute diastolic CHF (congestive heart failure) ([place])   . Arthritis   . Dyspnea   . Fever of unknown origin 03/19/2017  . Hyperlipidemia   . Hypertension   . Multifocal pneumonia   . Osteopenia   . Persistent atrial fibrillation   . Stroke [facility]) 2013    Past Surgical History:  Procedure Laterality Date  . ANKLE SURGERY    . APPENDECTOMY    . CHOLECYSTECTOMY    . HERNIA REPAIR     Esophagus  . JOINT REPLACEMENT     total- right partial- left  . MASTECTOMY PARTIAL / LUMPECTOMY  2012   left    There were no vitals filed for this visit.  Subjective Assessment - 07/01/18 1117    Subjective  Patient continues to reeport increased knee and ankle pain, als some calf and HS pain    Currently in Pain?  Yes    Pain Score  10-Worst pain ever    Pain Location  Knee   ankle   Pain Orientation  Left    Pain Descriptors / Indicators  Sore    Aggravating Factors   reports that walkin hurts    Pain Relieving Factors  gets better as the day goes on                       [facility] Adult PT Treatment/Exercise - 07/01/18 0001      Ambulation/Gait   Gait Comments  worked on her transfers she tends to collapse the knee into valgus and there for the ankle is in valgus.  She c/o pain  with the transfer and then with the first few steps, I did some education with miror and demo to show her what she is doing, she could not correct on her own without me doing it for her  (Pended)       Knee/Hip Exercises: Aerobic   Nustep  level 3 x 6 minutes  (Pended)       Knee/Hip Exercises: Seated   Long Arc Quad  Both;2 sets;15 reps;Limitations  (Pended)     Long Arc Quad Weight  3 lbs.  (Pended)     Long [employer] Limitations  5# increased the pain in the ankle and the knee  (Pended)     Other Seated Knee/Hip Exercises  foot on the sit fit ankle motions PF/DF, then Bosu behind her working on abs, 5# SLR in sitting with foot resting on a 4" step  (Pended)     Other Seated Knee/Hip Exercises  PF/DF ankle red tband 2x15, then some PROM of the left ankle with gentle approximation, yellow tband left shoulder  retraction, also worked Research scientist (physical sciences) he left leg working on her pushing me away for some approximation simulating a leg press  (Pended)                PT Short Term Goals - 06/13/18 1206      PT SHORT TERM GOAL #1   Title  independent with initial HEP    Status  Partially Met        PT Long Term Goals - 06/27/18 1308      PT LONG TERM GOAL #1   Title  decrease timed up and go test to 40 seconds    Status  On-going      PT LONG TERM GOAL #2   Title  increased right LE strength to 4-/5    Status  On-going      PT LONG TERM GOAL #3   Title  walk 200 feet without rest using FWW    Status  On-going      PT LONG TERM GOAL #4   Title  no falls over a 6 week period    Status  On-going            Plan - 07/01/18 1252    Clinical Impression Statement  Pain continues in the left ankle and the knee, I have spoke with her about some of her mechanics for years but she has been unable to change, she collapses the knee into valgus with most of her trnasfers and then in turn puts pressure on the ankle, I can physically place the foot and knee in the correct posistion but she finds  it easier to move the foot out again putting more torque on the knee.  She is limited with activities and exercises currently due to pain    PT Next Visit Plan  work on pain and function    Consulted and Agree with Plan of Care  Patient       Patient will benefit from skilled therapeutic intervention in order to improve the following deficits and impairments:  Abnormal gait, Decreased coordination, Decreased range of motion, Difficulty walking, Cardiopulmonary status limiting activity, Decreased endurance, Increased muscle spasms, Impaired UE functional use, Decreased activity tolerance, Pain, Decreased balance, Postural dysfunction, Decreased strength, Decreased mobility  Visit Diagnosis: Muscle weakness (generalized)  Difficulty in walking, not elsewhere classified  Late effects of CVA (cerebrovascular accident)  Hemiplegia and hemiparesis following cerebral infarction affecting left non-dominant side (HCC)  Chronic pain of left knee  Pain in left ankle and joints of left foot  Repeated falls  Ataxia, unspecified     Problem List Patient Active Problem List   Diagnosis Date Noted  . Acute ankle pain 05/23/2018  . Respiratory failure with hypoxia ([place]) 08/30/2017  . Hypoxemia   . Heart failure with preserved ejection fraction ([place]), Grade 3 diastolic dysfunction 75/05/2584  . Atrial fibrillation with RVR ([place])   . PAF (paroxysmal atrial fibrillation) ([place])   . Metabolic syndrome 27/78/2423  . Encounter for preventive health examination 02/17/2016  . Morbid obesity ([place]) 06/17/2015  . Hypomagnesemia 04/24/2014  . Hemiparesis affecting left side as late effect of cerebrovascular accident ([place]) 04/24/2014  . Nontraumatic cerebral hemorrhage ([place]) 04/30/2012  . DM (diabetes mellitus) with complications ([place]) 53/61/4431  . OSTEOPENIA 01/21/2009  . UNSPECIFIED VITAMIN D DEFICIENCY 11/19/2007  . ESSENTIAL HYPERTENSION, BENIGN 11/19/2007  . HYPERCHOLESTEROLEMIA 10/25/2006  .  GASTROESOPHAGEAL REFLUX, NO ESOPHAGITIS 10/25/2006  . DIVERTICULOSIS OF COLON 10/25/2006  . Osteoarthritis 10/25/2006  . CERVICAL SPINE  DISORDER, NOS 10/25/2006    Sumner Boast., PT 07/01/2018, 12:54 PM  [address] [place], [place], [address] Phone: [phone]   Fax:  [phone]  Name: KAMERA DUBAS MRN: [id number] Date of Birth: [birth year]

## 2018-07-03 ENCOUNTER — Ambulatory Visit: Payer: Medicare Other | Admitting: Physical Therapy

## 2018-07-03 ENCOUNTER — Encounter: Payer: Self-pay | Admitting: Physical Therapy

## 2018-07-03 DIAGNOSIS — I6990 Unspecified sequelae of unspecified cerebrovascular disease: Secondary | ICD-10-CM

## 2018-07-03 DIAGNOSIS — M25562 Pain in left knee: Secondary | ICD-10-CM

## 2018-07-03 DIAGNOSIS — I69354 Hemiplegia and hemiparesis following cerebral infarction affecting left non-dominant side: Secondary | ICD-10-CM | POA: Diagnosis not present

## 2018-07-03 DIAGNOSIS — M6281 Muscle weakness (generalized): Secondary | ICD-10-CM

## 2018-07-03 DIAGNOSIS — R252 Cramp and spasm: Secondary | ICD-10-CM

## 2018-07-03 DIAGNOSIS — R2242 Localized swelling, mass and lump, left lower limb: Secondary | ICD-10-CM

## 2018-07-03 DIAGNOSIS — R270 Ataxia, unspecified: Secondary | ICD-10-CM

## 2018-07-03 DIAGNOSIS — R262 Difficulty in walking, not elsewhere classified: Secondary | ICD-10-CM

## 2018-07-03 DIAGNOSIS — G8929 Other chronic pain: Secondary | ICD-10-CM

## 2018-07-03 DIAGNOSIS — M25572 Pain in left ankle and joints of left foot: Secondary | ICD-10-CM

## 2018-07-03 DIAGNOSIS — R296 Repeated falls: Secondary | ICD-10-CM

## 2018-07-03 NOTE — Therapy (Signed)
[address] [place], [place], [address] Phone: [phone]   Fax:  [phone]  Physical Therapy Treatment  Patient Details  Name: Karen Jackson MRN: [id number] Date of Birth: 12/08/1940 Referring Provider (PT): Hensel   Encounter Date: 07/03/2018  PT End of Session - 07/03/18 1135    Visit Number  9    Date for PT Re-Evaluation  08/05/18    PT Start Time  1056    PT Stop Time  1140    PT Time Calculation (min)  44 min    Activity Tolerance  Patient limited by pain    Behavior During Therapy  [facility] for tasks assessed/performed       Past Medical History:  Diagnosis Date  . Acute cystitis without hematuria   . Acute diastolic CHF (congestive heart failure) ([place])   . Arthritis   . Dyspnea   . Fever of unknown origin 03/19/2017  . Hyperlipidemia   . Hypertension   . Multifocal pneumonia   . Osteopenia   . Persistent atrial fibrillation   . Stroke [facility]) 2013    Past Surgical History:  Procedure Laterality Date  . ANKLE SURGERY    . APPENDECTOMY    . CHOLECYSTECTOMY    . HERNIA REPAIR     Esophagus  . JOINT REPLACEMENT     total- right partial- left  . MASTECTOMY PARTIAL / LUMPECTOMY  2012   left    There were no vitals filed for this visit.  Subjective Assessment - 07/03/18 1131    Subjective  Patient having some increased left ankle, knee and calf pain    Currently in Pain?  Yes    Pain Score  8     Pain Location  Knee   ankle and calf   Pain Orientation  Left    Pain Descriptors / Indicators  Sore    Aggravating Factors   standing and walking, putting weight on the left side     Pain Relieving Factors  usually reports that the pain gets better as the day goes on                       [facility] Adult PT Treatment/Exercise - 07/03/18 0001      Ambulation/Gait   Gait Comments  worked on her transfers she tends to collapse the knee into valgus and there for the ankle is  in valgus.  She c/o pain with the transfer and then with the first few steps, I did some education with miror and demo to show her what she is doing, she could not correct on her own without me doing it for her, HHA gait in the clinic with her reporting pain the first step or two and then it easing to allow better gait      Knee/Hip Exercises: Aerobic   Nustep  level 2 x 6 minutes      Moist Heat Therapy   Number Minutes Moist Heat  15 Minutes    Moist Heat Location  Knee      Electrical Stimulation   Electrical Stimulation Location  left knee and into the calf    Electrical Stimulation Action  IFC    Electrical Stimulation Parameters  sitting    Electrical Stimulation Goals  Pain      Manual Therapy   Manual Therapy  Soft tissue mobilization    Soft tissue mobilization  gentle to the left calf,  she is very tender, has pain with calf stretch, there is no heat or redness               PT Short Term Goals - 06/13/18 1206      PT SHORT TERM GOAL #1   Title  independent with initial HEP    Status  Partially Met        PT Long Term Goals - 07/03/18 1139      PT LONG TERM GOAL #1   Title  decrease timed up and go test to 40 seconds    Status  On-going      PT LONG TERM GOAL #2   Title  increased right LE strength to 4-/5    Status  On-going      PT LONG TERM GOAL #3   Title  walk 200 feet without rest using FWW    Status  On-going      PT LONG TERM GOAL #4   Title  no falls over a 6 week period    Status  On-going      PT LONG TERM GOAL #5   Title  report no difficulty pulling her pants up when going to the bathroom    Status  On-going            Plan - 07/03/18 1136    Clinical Impression Statement  Patient has had increased left knee and ankle pain and now some increased left calf pain.  I have spoken to her about how she transfers for a number of years and worked with her to correct but she truly has never been able to correct the bac habits that she  has, she tends to rush and not get fully turned before plopping into the chair, this puts torque on the knee collapsing the knee into valgus as well as the ankle, she also toes some valgus when getting up from sitting and this in turn increases her pain, she tends to get better as the day goes on and as she does some gentle weight shift prior to walking.  My biggest worry is the increased pain and the decreased function and loss of independence    PT Next Visit Plan  if you have suggestions for [place] please advise, otherwise we will continue to work on the indepdnence and functional mobility    Consulted and Agree with Plan of Care  Patient       Patient will benefit from skilled therapeutic intervention in order to improve the following deficits and impairments:  Abnormal gait, Decreased coordination, Decreased range of motion, Difficulty walking, Cardiopulmonary status limiting activity, Decreased endurance, Increased muscle spasms, Impaired UE functional use, Decreased activity tolerance, Pain, Decreased balance, Postural dysfunction, Decreased strength, Decreased mobility  Visit Diagnosis: Muscle weakness (generalized)  Difficulty in walking, not elsewhere classified  Late effects of CVA (cerebrovascular accident)  Hemiplegia and hemiparesis following cerebral infarction affecting left non-dominant side (HCC)  Chronic pain of left knee  Pain in left ankle and joints of left foot  Repeated falls  Ataxia, unspecified  Localized swelling, mass and lump, left lower limb  Cramp and spasm     Problem List Patient Active Problem List   Diagnosis Date Noted  . Acute ankle pain 05/23/2018  . Respiratory failure with hypoxia ([place]) 08/30/2017  . Hypoxemia   . Heart failure with preserved ejection fraction ([place]), Grade 3 diastolic dysfunction 18/84/1660  . Atrial fibrillation with RVR ([place])   . PAF (paroxysmal atrial  fibrillation) ([place])   . Metabolic syndrome 66/19/6940  . Encounter  for preventive health examination 02/17/2016  . Morbid obesity ([place]) 06/17/2015  . Hypomagnesemia 04/24/2014  . Hemiparesis affecting left side as late effect of cerebrovascular accident ([place]) 04/24/2014  . Nontraumatic cerebral hemorrhage ([place]) 04/30/2012  . DM (diabetes mellitus) with complications ([place]) 98/28/6751  . OSTEOPENIA 01/21/2009  . UNSPECIFIED VITAMIN D DEFICIENCY 11/19/2007  . ESSENTIAL HYPERTENSION, BENIGN 11/19/2007  . HYPERCHOLESTEROLEMIA 10/25/2006  . GASTROESOPHAGEAL REFLUX, NO ESOPHAGITIS 10/25/2006  . DIVERTICULOSIS OF COLON 10/25/2006  . Osteoarthritis 10/25/2006  . CERVICAL SPINE DISORDER, NOS 10/25/2006    Sumner Boast., PT 07/03/2018, 11:40 AM  [address] [place], [place], [address] Phone: [phone]   Fax:  [phone]  Name: Karen Jackson MRN: [id number] Date of Birth: 09-Oct-1940

## 2018-07-05 DIAGNOSIS — Z96652 Presence of left artificial knee joint: Secondary | ICD-10-CM | POA: Diagnosis not present

## 2018-07-05 DIAGNOSIS — M2352 Chronic instability of knee, left knee: Secondary | ICD-10-CM | POA: Diagnosis not present

## 2018-07-06 ENCOUNTER — Other Ambulatory Visit: Payer: Self-pay | Admitting: Family Medicine

## 2018-07-06 DIAGNOSIS — E118 Type 2 diabetes mellitus with unspecified complications: Secondary | ICD-10-CM

## 2018-07-06 DIAGNOSIS — I480 Paroxysmal atrial fibrillation: Secondary | ICD-10-CM

## 2018-07-09 ENCOUNTER — Other Ambulatory Visit: Payer: Self-pay | Admitting: Orthopedic Surgery

## 2018-07-09 ENCOUNTER — Ambulatory Visit: Payer: Medicare Other | Admitting: Physical Therapy

## 2018-07-09 DIAGNOSIS — R52 Pain, unspecified: Secondary | ICD-10-CM

## 2018-07-09 DIAGNOSIS — M25362 Other instability, left knee: Secondary | ICD-10-CM

## 2018-07-10 DIAGNOSIS — Z96652 Presence of left artificial knee joint: Secondary | ICD-10-CM | POA: Diagnosis not present

## 2018-07-10 DIAGNOSIS — M65862 Other synovitis and tenosynovitis, left lower leg: Secondary | ICD-10-CM | POA: Diagnosis not present

## 2018-07-10 DIAGNOSIS — S72422A Displaced fracture of lateral condyle of left femur, initial encounter for closed fracture: Secondary | ICD-10-CM | POA: Diagnosis not present

## 2018-07-10 DIAGNOSIS — M25462 Effusion, left knee: Secondary | ICD-10-CM | POA: Diagnosis not present

## 2018-07-10 DIAGNOSIS — R600 Localized edema: Secondary | ICD-10-CM | POA: Diagnosis not present

## 2018-07-10 DIAGNOSIS — S82142A Displaced bicondylar fracture of left tibia, initial encounter for closed fracture: Secondary | ICD-10-CM | POA: Diagnosis not present

## 2018-07-11 ENCOUNTER — Ambulatory Visit: Payer: Medicare Other | Admitting: Physical Therapy

## 2018-07-15 ENCOUNTER — Encounter (HOSPITAL_COMMUNITY): Payer: Self-pay

## 2018-07-15 ENCOUNTER — Emergency Department (HOSPITAL_COMMUNITY): Payer: Medicare Other

## 2018-07-15 ENCOUNTER — Emergency Department (HOSPITAL_COMMUNITY)
Admission: EM | Admit: 2018-07-15 | Discharge: 2018-07-16 | Disposition: A | Discharge status: Rehabilitation Facility | Payer: Medicare Other | Attending: Emergency Medicine | Admitting: Emergency Medicine

## 2018-07-15 DIAGNOSIS — Y999 Unspecified external cause status: Secondary | ICD-10-CM | POA: Diagnosis not present

## 2018-07-15 DIAGNOSIS — Z87891 Personal history of nicotine dependence: Secondary | ICD-10-CM | POA: Diagnosis not present

## 2018-07-15 DIAGNOSIS — Z79899 Other long term (current) drug therapy: Secondary | ICD-10-CM | POA: Diagnosis not present

## 2018-07-15 DIAGNOSIS — I110 Hypertensive heart disease with heart failure: Secondary | ICD-10-CM | POA: Diagnosis not present

## 2018-07-15 DIAGNOSIS — Y92002 Bathroom of unspecified non-institutional (private) residence single-family (private) house as the place of occurrence of the external cause: Secondary | ICD-10-CM | POA: Diagnosis not present

## 2018-07-15 DIAGNOSIS — R52 Pain, unspecified: Secondary | ICD-10-CM | POA: Diagnosis not present

## 2018-07-15 DIAGNOSIS — R0902 Hypoxemia: Secondary | ICD-10-CM | POA: Diagnosis not present

## 2018-07-15 DIAGNOSIS — W182XXA Fall in (into) shower or empty bathtub, initial encounter: Secondary | ICD-10-CM | POA: Diagnosis not present

## 2018-07-15 DIAGNOSIS — Y93E1 Activity, personal bathing and showering: Secondary | ICD-10-CM | POA: Insufficient documentation

## 2018-07-15 DIAGNOSIS — W19XXXA Unspecified fall, initial encounter: Secondary | ICD-10-CM | POA: Diagnosis not present

## 2018-07-15 DIAGNOSIS — I10 Essential (primary) hypertension: Secondary | ICD-10-CM | POA: Diagnosis not present

## 2018-07-15 DIAGNOSIS — S8262XA Displaced fracture of lateral malleolus of left fibula, initial encounter for closed fracture: Secondary | ICD-10-CM | POA: Insufficient documentation

## 2018-07-15 DIAGNOSIS — I5031 Acute diastolic (congestive) heart failure: Secondary | ICD-10-CM | POA: Diagnosis not present

## 2018-07-15 DIAGNOSIS — S8252XA Displaced fracture of medial malleolus of left tibia, initial encounter for closed fracture: Secondary | ICD-10-CM | POA: Diagnosis not present

## 2018-07-15 DIAGNOSIS — E119 Type 2 diabetes mellitus without complications: Secondary | ICD-10-CM | POA: Diagnosis not present

## 2018-07-15 DIAGNOSIS — M25462 Effusion, left knee: Secondary | ICD-10-CM | POA: Diagnosis not present

## 2018-07-15 DIAGNOSIS — S8992XA Unspecified injury of left lower leg, initial encounter: Secondary | ICD-10-CM | POA: Diagnosis present

## 2018-07-15 MED ORDER — CELECOXIB 200 MG PO CAPS
200.0000 mg | ORAL_CAPSULE | Freq: Every day | ORAL | Status: DC
  Administered 2018-07-16: 200 mg via ORAL
  Filled 2018-07-15: qty 1

## 2018-07-15 MED ORDER — MAGNESIUM OXIDE 400 (241.3 MG) MG PO TABS
400.0000 mg | ORAL_TABLET | Freq: Every day | ORAL | Status: DC
  Administered 2018-07-16: 400 mg via ORAL
  Filled 2018-07-15: qty 1

## 2018-07-15 MED ORDER — TRAZODONE HCL 50 MG PO TABS
50.0000 mg | ORAL_TABLET | Freq: Every day | ORAL | Status: DC
  Administered 2018-07-15: 50 mg via ORAL
  Filled 2018-07-15: qty 1

## 2018-07-15 MED ORDER — DILTIAZEM HCL ER COATED BEADS 240 MG PO CP24
240.0000 mg | ORAL_CAPSULE | Freq: Every day | ORAL | Status: DC
  Administered 2018-07-16: 240 mg via ORAL
  Filled 2018-07-15: qty 1

## 2018-07-15 MED ORDER — PANTOPRAZOLE SODIUM 40 MG PO TBEC
40.0000 mg | DELAYED_RELEASE_TABLET | Freq: Every day | ORAL | Status: DC
  Administered 2018-07-16: 40 mg via ORAL
  Filled 2018-07-15: qty 1

## 2018-07-15 MED ORDER — ATORVASTATIN CALCIUM 20 MG PO TABS
20.0000 mg | ORAL_TABLET | Freq: Every day | ORAL | Status: DC
  Administered 2018-07-15 – 2018-07-16 (×2): 20 mg via ORAL
  Filled 2018-07-15 (×2): qty 1

## 2018-07-15 MED ORDER — TRAMADOL HCL 50 MG PO TABS
50.0000 mg | ORAL_TABLET | Freq: Two times a day (BID) | ORAL | Status: DC | PRN
  Filled 2018-07-15: qty 1

## 2018-07-15 MED ORDER — HAIR/SKIN/NAILS PO CAPS: 3.0000 | ORAL_CAPSULE | Freq: Every day | ORAL | Status: DC

## 2018-07-15 MED ORDER — ADULT MULTIVITAMIN W/MINERALS CH
1.0000 | ORAL_TABLET | Freq: Every day | ORAL | Status: DC
  Administered 2018-07-16: 1 via ORAL
  Filled 2018-07-15: qty 1

## 2018-07-15 MED ORDER — METOPROLOL TARTRATE 25 MG PO TABS
50.0000 mg | ORAL_TABLET | Freq: Two times a day (BID) | ORAL | Status: DC
  Administered 2018-07-16: 25 mg via ORAL
  Filled 2018-07-15 (×2): qty 2

## 2018-07-15 NOTE — ED Provider Notes (Signed)
[facility] DEPT Provider Note   CSN: [id number] Arrival date & time: 07/15/18  1514     History   Chief Complaint Chief Complaint  Patient presents with  . Fall    HPI KATEENA DEGROOTE is a 77 y.o. female.  HPI Pt has a bad knee.  She had an MRI recently.  She was getting ready to go to the doctors office to go over the results when she felt like her knee locked up on her as she was getting out of the shower and fell.  Pt injured her left ankle in the fall.  She can't stand now.  She had to call EMS.  She did not hit her head.  No LOC.  No chest pain or shortness of breath.  No numbness or weakness.  Last tet 2019 Past Medical History:  Diagnosis Date  . Acute cystitis without hematuria   . Acute diastolic CHF (congestive heart failure) ([place])   . Arthritis   . Dyspnea   . Fever of unknown origin 03/19/2017  . Hyperlipidemia   . Hypertension   . Multifocal pneumonia   . Osteopenia   . Persistent atrial fibrillation   . Stroke [facility]) 2013    Patient Active Problem List   Diagnosis Date Noted  . Acute ankle pain 05/23/2018  . Respiratory failure with hypoxia ([place]) 08/30/2017  . Hypoxemia   . Heart failure with preserved ejection fraction ([place]), Grade 3 diastolic dysfunction 19/50/9326  . Atrial fibrillation with RVR ([place])   . PAF (paroxysmal atrial fibrillation) ([place])   . Metabolic syndrome 71/24/5809  . Encounter for preventive health examination 02/17/2016  . Morbid obesity ([place]) 06/17/2015  . Hypomagnesemia 04/24/2014  . Hemiparesis affecting left side as late effect of cerebrovascular accident ([place]) 04/24/2014  . Nontraumatic cerebral hemorrhage ([place]) 04/30/2012  . DM (diabetes mellitus) with complications ([place]) 98/33/8250  . OSTEOPENIA 01/21/2009  . UNSPECIFIED VITAMIN D DEFICIENCY 11/19/2007  . ESSENTIAL HYPERTENSION, BENIGN 11/19/2007  . HYPERCHOLESTEROLEMIA 10/25/2006  . GASTROESOPHAGEAL REFLUX, NO ESOPHAGITIS 10/25/2006  .  DIVERTICULOSIS OF COLON 10/25/2006  . Osteoarthritis 10/25/2006  . CERVICAL SPINE DISORDER, NOS 10/25/2006    Past Surgical History:  Procedure Laterality Date  . ANKLE SURGERY    . APPENDECTOMY    . CHOLECYSTECTOMY    . HERNIA REPAIR     Esophagus  . JOINT REPLACEMENT     total- right partial- left  . MASTECTOMY PARTIAL / LUMPECTOMY  2012   left     OB History   None      Home Medications    Prior to Admission medications   Medication Sig Start Date End Date Taking? Authorizing Provider  atorvastatin (LIPITOR) 20 MG tablet TAKE 1 TABLET BY MOUTH EVERY DAY 04/08/18  Yes Hensel, Jamal Collin, MD  Calcium Carb-Cholecalciferol (CALCIUM CARBONATE-VITAMIN D3) 600-400 MG-UNIT TABS Take 1 tablet by mouth daily. 04/27/14  Yes Hensel, Jamal Collin, MD  celecoxib (CELEBREX) 200 MG capsule TAKE 1 CAPSULE BY MOUTH EVERY DAY WITH FOOD Patient taking differently: Take 200 mg by mouth daily.  08/01/17  Yes Hensel, Jamal Collin, MD  diclofenac sodium (VOLTAREN) 1 % GEL APPLY 2 GRAMS EXTERNALLY TO THE AFFECTED AREA FOUR TIMES DAILY Patient taking differently: Apply 2 g topically daily.  10/08/17  Yes Hensel, Jamal Collin, MD  diltiazem (CARDIZEM CD) 240 MG 24 hr capsule TAKE 1 CAPSULE(240 MG) BY MOUTH DAILY Patient taking differently: Take 240 mg by mouth daily.  07/08/18  Yes Madison Hickman  A, MD  furosemide (LASIX) 80 MG tablet TAKE 1 TABLET BY MOUTH EVERY DAY 10/11/17  Yes Hensel, Jamal Collin, MD  gabapentin (NEURONTIN) 100 MG capsule TAKE 1 CAPSULE BY MOUTH THREE TIMES DAILY 07/08/18  Yes Hensel, Jamal Collin, MD  MAGNESIUM-OXIDE 400 (241.3 Mg) MG tablet TAKE 1 TABLET BY MOUTH DAILY 10/04/17  Yes Hensel, Jamal Collin, MD  metoprolol tartrate (LOPRESSOR) 50 MG tablet TAKE 1 TABLET BY MOUTH TWICE DAILY 07/08/18  Yes Hensel, Jamal Collin, MD  Multiple Vitamins-Minerals (HAIR/SKIN/NAILS) CAPS Take 3 capsules by mouth daily.   Yes [provider]  omeprazole (PRILOSEC) 20 MG capsule TAKE 1 CAPSULE BY MOUTH EVERY  DAY 11/22/17  Yes Hensel, Jamal Collin, MD  traZODone (DESYREL) 50 MG tablet TAKE 1/2 TO 1 TABLET BY MOUTH AT BEDTIME AS NEEDED FOR SLEEP Patient taking differently: Take 50 mg by mouth at bedtime.  03/11/18  Yes Hensel, Jamal Collin, MD  TURMERIC PO Take 1 capsule by mouth daily.   Yes [provider]  ipratropium-albuterol (DUONEB) 0.5-2.5 (3) MG/3ML SOLN Take 3 mLs by nebulization 3 (three) times daily. Patient not taking: Reported on 09/10/2017 03/28/17   Kathrene Alu, MD    Family History Family History  Problem Relation Age of Onset  . Diabetes Mother     Social History Social History   Tobacco Use  . Smoking status: Former Smoker    Packs/day: 0.50    Years: 23.00    Pack years: 11.50    Types: Cigarettes    Start date: 08/28/1957    Last attempt to quit: 03/09/1981    Years since quitting: 37.3  . Smokeless tobacco: Never Used  Substance Use Topics  . Alcohol use: Yes    Alcohol/week: 14.0 standard drinks    Types: 14 Standard drinks or equivalent per week  . Drug use: No     Allergies   Codeine phosphate and Simvastatin   Review of Systems Review of Systems  All other systems reviewed and are negative.    Physical Exam Updated Vital Signs BP (!) 128/58   Pulse 64   Temp 98 F (36.7 C) (Oral)   Resp 17   SpO2 91%   Physical Exam  Constitutional: She appears well-developed and well-nourished. No distress.  HENT:  Head: Normocephalic and atraumatic.  Right Ear: External ear normal.  Left Ear: External ear normal.  Eyes: Conjunctivae are normal. Right eye exhibits no discharge. Left eye exhibits no discharge. No scleral icterus.  Neck: Neck supple. No tracheal deviation present.  Cardiovascular: Normal rate.  Pulmonary/Chest: Effort normal. No stridor. No respiratory distress.  Abdominal: She exhibits no distension.  Musculoskeletal: She exhibits no edema.       Right shoulder: She exhibits no tenderness, no bony tenderness and no swelling.        Left shoulder: She exhibits no tenderness, no bony tenderness and no swelling.       Right wrist: She exhibits no tenderness, no bony tenderness and no swelling.       Left wrist: She exhibits no tenderness, no bony tenderness and no swelling.       Right hip: She exhibits normal range of motion, no tenderness, no bony tenderness and no swelling.       Left hip: She exhibits normal range of motion, no tenderness and no bony tenderness.       Left knee: She exhibits swelling. Tenderness found.       Right ankle: She exhibits no swelling. No  tenderness.       Left ankle: She exhibits decreased range of motion and swelling. She exhibits no deformity. Tenderness.       Cervical back: She exhibits no tenderness, no bony tenderness and no swelling.       Thoracic back: She exhibits no tenderness, no bony tenderness and no swelling.       Lumbar back: She exhibits no tenderness, no bony tenderness and no swelling.       Left lower leg: She exhibits tenderness. She exhibits no swelling, no edema and no deformity.  ttp mid left tibia  Neurological: She is alert. Cranial nerve deficit: no gross deficits.  Skin: Skin is warm and dry. No rash noted.  Psychiatric: She has a normal mood and affect.  Nursing note and vitals reviewed.    ED Treatments / Results  Labs (all labs ordered are listed, but only abnormal results are displayed) Labs Reviewed - No data to display  EKG None  Radiology Dg Tibia/fibula Left  Result Date: 07/15/2018 CLINICAL DATA:  Fall.  Left knee and ankle pain. EXAM: LEFT TIBIA AND FIBULA - 2 VIEW COMPARISON:  Left ankle radiographs. FINDINGS: Oblique fracture of the lateral malleolus is again noted. No additional fractures are present in the proximal tibia or fibula. Partial knee arthroplasty is again noted. IMPRESSION: 1. Oblique fracture of the lateral malleolus. Please see report of ankle radiographs for more details. 2. The more proximal tibia and fibula are intact.  Electronically Signed   By: San Morelle M.D.   On: 07/15/2018 16:52   Dg Ankle Complete Left  Result Date: 07/15/2018 CLINICAL DATA:  Fall.  Left ankle pain and swelling. EXAM: LEFT ANKLE COMPLETE - 3+ VIEW COMPARISON:  None. FINDINGS: An oblique fracture is present in the lateral malleolus. There is widening of the talotibial distance medially without a discrete fracture of the medial malleolus. Talar dome is intact. Mild osteopenia is noted. Vascular calcifications are evident. IMPRESSION: 1. Oblique fracture of the lateral malleolus. 2. Widening of the tibiotalar distance without fracture of the medial malleolus. 3. Vascular calcifications consistent with diabetes. Electronically Signed   By: San Morelle M.D.   On: 07/15/2018 16:44   Dg Knee Complete 4 Views Left  Result Date: 07/15/2018 CLINICAL DATA:  Fall.  Left knee pain. EXAM: LEFT KNEE - COMPLETE 4+ VIEW COMPARISON:  None. FINDINGS: Medial partial arthroplasty is intact. Degenerative changes are present in the lateral component of the knee. Joint space is preserved. A small joint effusion is present. No acute fractures are evident. IMPRESSION: 1. Partial knee replacement is intact. 2. No acute osseous abnormality. 3. Small joint effusion.  Soft tissue injury is not excluded. Electronically Signed   By: San Morelle M.D.   On: 07/15/2018 16:45    Procedures Procedures (including critical care time)  Medications Ordered in ED Medications  atorvastatin (LIPITOR) tablet 20 mg (has no administration in time range)  celecoxib (CELEBREX) capsule 200 mg (has no administration in time range)  diltiazem (CARDIZEM CD) 24 hr capsule 240 mg (has no administration in time range)  magnesium oxide (MAG-OX) tablet 400 mg (has no administration in time range)  metoprolol tartrate (LOPRESSOR) tablet 50 mg (has no administration in time range)  HAIR/SKIN/NAILS CAPS 3 capsule (has no administration in time range)  pantoprazole  (PROTONIX) EC tablet 40 mg (has no administration in time range)  traZODone (DESYREL) tablet 50 mg (has no administration in time range)     Initial Impression / Assessment  and Plan / ED Course  I have reviewed the triage vital signs and the nursing notes.  Pertinent labs & imaging results that were available during my care of the patient were reviewed by me and considered in my medical decision making (see chart for details).  Clinical Course as of Jul 15 2002  Mon Jul 15, 2018  1820 Xrays reviewed.  No medical fracture but displacement suggests ligamentous component.  Will splint and will need ortho follow up   [JK]  1821 Pt lives at home and is concerned about going home.  Will consult with social work to discuss options.   [JK]  [facility] Work consult, Roderic Palau Riffey.   Plan is to get pt placed into a nursing facility.  Anticipate stay overnight in the ED until that is arranged   [JK]    Clinical Course User Index [JK] Dorie Rank, MD     Final Clinical Impressions(s) / ED Diagnoses   Final diagnoses:  Closed displaced fracture of lateral malleolus of left fibula, initial encounter    ED Discharge Orders    None       Dorie Rank, MD 07/16/18 [phone]

## 2018-07-15 NOTE — Progress Notes (Addendum)
CSW completed assessment, FL-2 signed and sent via the hub to [place] area.  CSW spoke to pt/niece.  Both are aware [facility] and [facility] are full (first 2 choices) and stated they DO NOT [facility] SNF. Pt also does not want [facility].  Kelly at [facility] is not aware of this and CSW did not tell Claiborne Billings should pt not have any other choices.  As of now [facility] has an open bed, a private room available on the morning of 11/19 at:  $311 a day and would need 2 weeks up front @$4354 on 11/19 for pt to move in.  Pt stated first choices are:  1. [facility] called Winona Legato in admissions at ph: [phone] (CSW caled and texted a HIPPA-compliant text/explanation of need).  2. [facility] left message Otilio Carpen will be back on Tuesday.  3. [facility] called Katie in admissions and left a VM, but Joellen Jersey will not be back until Wednesday, CSW called the CSW at [facility] at ph: [phone] ext [phone] and left a VM requesting to see if there are open beds and left a VM requesting a call back on 11/19 to [phone].  4. [facility] - CSW called and left a VM with Camille at ph: [phone] and called [facility] at ph: [phone] and left a HIPPA-compliant VM as well stating pt in the ED wants a private pay bed.  CSW called pt's niece  Family were counseled by CSW that if availability comes open at a facility that the CSW calls and speaks to (pt/niece have already provided permission for the CSW to send referrals out to the [place] area) then pt must either go to the first available facility (they can choose from 1st available on 11/9) or they must go home.  Pt/niece voiced understanding to the 2nd shift ED CSW who told them they have one (1) night and then must D/C on 11/9 to either available SNF or home.  CSW will continue to follow for D/C needs.  Alphonse Guild. , LCSW, LCAS, CSI Clinical Social Worker Ph:  [phone]

## 2018-07-15 NOTE — ED Triage Notes (Signed)
Patient arrived via [facility] from home. Patient c/o left knee and foot pain. Patient fell this morning on her knee/ankle and refused evaluation with ems. Throughout the day pain got worse. Patient took 2 Celebrex with no relief. Patient was suppose to go to doctor today to go over MRI for possible meniscus tear. Partial knee replacement in left knee. Left knee slightly swollen.  Denies LOC during fall.   10/10 stabbing pain and pin point   Hx. Stroke 6 years ago with left sided residual.

## 2018-07-15 NOTE — ED Notes (Signed)
Patient stated that she normally sleeps with 2 L of oxygen via [place] at night, O2 at 89, oxygen applied.

## 2018-07-15 NOTE — Clinical Social Work Note (Signed)
Clinical Social Work Assessment  Patient Details  Name: Karen Jackson MRN: [id number] Date of Birth: 07/22/1941  Date of referral:  07/15/18               Reason for consult:  Facility Placement                Permission sought to share information with:  Facility Contact Representative Permission granted to share information::  Yes, Verbal Permission Granted  Name::        Agency::     Relationship::     Contact Information:     Housing/Transportation Living arrangements for the past 2 months:    Source of Information:  Patient(Pt/pt niece) Patient Interpreter Needed:  None Criminal Activity/Legal Involvement Pertinent to Current Situation/Hospitalization:    Significant Relationships:  (Niece) Lives with:  Self Do you feel safe going back to the place where you live?  No Need for family participation in patient care:  No (Coment)  Care giving concerns:  Per EDP, pt was to arrive at the pt's specialist for a consult for knee surgery today but fell before being able to go and has now injured her ankle.  Per pt/family pt has around the clock care but they are unable to lift the pt and pt has been immobile since her ankle injury and is willing to private pay since pt has Medicare A&B and a 3-day inpatient stay would be required to use the pt's primary insurance.  CSW received verbal permission from the pt to contact facilities for possible placement and pt agrees to private pay and CSW stated if pt is placed from the ED the pt must go to the first available facility.  Pt voiced understanding in the presence of the pt's niece [name] at ph: [phone]  CSW called [facility] and spoke to admissions who stated they would have no openings until next week and CSW called [facility] (pt's first two choices) who statyed they will have no beds until "later in the week".  CSW called Keneisha at [facility] (third choice) at ph: [phone] who did not answer via call or text.  Per  CSW notes from 03/22/17 pt's  PASSR is: [id number].   CSW confirmed this on [place] MUST Start date: 03/13/2012 No end date, per [place] MUST.   Social Worker assessment / plan:  CSW met with pt and confirmed pt's plan to be discharged to a SNF for rehab at discharge.  CSW provided active listening and validated pt's concerns.   CSW will complete and pt provided verbal permission for CSW to create FL-2 and send referrals out to SNF facilities via the hub per pt's request.  Pt has been living independently prior to being admitted to [facility] ED.  Employment status:  Retired Insurance information:  Medicare, Managed Care(Also [employer] L[employer]) PT Recommendations:  Not assessed at this time Information / Referral to community resources:     Patient/Family's Response to care:  Patient alert and oriented.  Patient and pt's niece agreeable to plan.  Pt and pt's niece  supportive and strongly involved in pt.'s care.  Pt pt's niece pleasant and appreciated CSW intervention.    Patient/Family's Understanding of and Emotional Response to Diagnosis, Current Treatment, and Prognosis:  Still assessing  Emotional Assessment Appearance:  Appears stated age Attitude/Demeanor/Rapport:    Affect (typically observed):  Accepting, Calm, Pleasant, Adaptable Orientation:  Oriented to Self, Oriented to Place, Oriented to  Time, Oriented to Situation Alcohol / Substance use:      Psych involvement (Current and /or in the community):     Discharge Needs  Concerns to be addressed:  (Pt cannot care for herself at home, nor can her caregivers lift her.) Readmission within the last 30 days:  No Current discharge risk:  Lives alone Barriers to Discharge:  No Barriers Identified    F , LCSWA 07/15/2018, 6:53 PM  

## 2018-07-15 NOTE — Progress Notes (Addendum)
Per EDP, pt was to arrive at the pt's specialist for a consult for knee surgery today but fell before being able to go and has now injured her ankle.  Per pt/family pt has around the clock care but they are unable to lift the pt and pt has been immobile since her ankle injury and is willing to private pay since pt has Medicare A&B and a 3-day inpatient stay would be required to use the pt's primary insurance.  CSW received verbal permission from the pt to contact facilities for possible placement and pt agrees to private pay and CSW stated if pt is placed from the ED the pt must go to the first available facility.  Pt voiced understanding in the presence of the pt's niece Haze Rushing at ph: [phone]  CSW called [facility] and spoke to admissions who stated they woukld have no openings until next week and CSW called [facility] (pt's first two choices) who statyed they will have no beds until "later in the week".  CSW called [place] [facility] (third choice) at ph: [phone] who did not answer via call or text.  Per CSW notes from 03/22/17 pt's  PASSR is: [id number].   CSW confirmed this on [place] MUST Start date: 03/13/2012 No end date, per [place] MUST.  CSW will continue to follow for D/C needs.  Alphonse Guild. , LCSW, LCAS, CSI Clinical Social Worker Ph: [phone]

## 2018-07-15 NOTE — Discharge Instructions (Addendum)
Continue your medications, do not put any weight on that leg, follow-up with Dr. Lorre Nick to discuss further treatment  Non weight bearing on left lower extremity until seen and evaluated by orthopedics

## 2018-07-15 NOTE — Addendum Note (Signed)
Addended by: Dorie Rank on: 07/15/2018 03:39 PM   Modules accepted: Miquel Dunn

## 2018-07-15 NOTE — NC FL2 (Signed)
[address] LEVEL OF CARE SCREENING TOOL     IDENTIFICATION  Patient Name: Karen Jackson Birthdate: [birth year] Sex: female Admission Date (Current Location): 07/15/2018  [facility] [place] Number:  Herbalist and Address:  [facility],  [place] [address] [place]      Provider Number: [id number]  Attending Physician Name and Address:  Dorie Rank, MD  Relative Name and Phone Number:       Current Level of Care: Hospital Recommended Level of Care: [place] Prior Approval Number:    Date Approved/Denied: 03/13/12 PASRR Number: [id number]  Discharge Plan: SNF    Current Diagnoses: Patient Active Problem List   Diagnosis Date Noted  . Acute ankle pain 05/23/2018  . Respiratory failure with hypoxia ([place]) 08/30/2017  . Hypoxemia   . Heart failure with preserved ejection fraction ([place]), Grade 3 diastolic dysfunction 81/85/6314  . Atrial fibrillation with RVR ([place])   . PAF (paroxysmal atrial fibrillation) ([place])   . Metabolic syndrome 97/09/6376  . Encounter for preventive health examination 02/17/2016  . Morbid obesity ([place]) 06/17/2015  . Hypomagnesemia 04/24/2014  . Hemiparesis affecting left side as late effect of cerebrovascular accident ([place]) 04/24/2014  . Nontraumatic cerebral hemorrhage ([place]) 04/30/2012  . DM (diabetes mellitus) with complications ([place]) 58/85/0277  . OSTEOPENIA 01/21/2009  . UNSPECIFIED VITAMIN D DEFICIENCY 11/19/2007  . ESSENTIAL HYPERTENSION, BENIGN 11/19/2007  . HYPERCHOLESTEROLEMIA 10/25/2006  . GASTROESOPHAGEAL REFLUX, NO ESOPHAGITIS 10/25/2006  . DIVERTICULOSIS OF COLON 10/25/2006  . Osteoarthritis 10/25/2006  . CERVICAL SPINE DISORDER, NOS 10/25/2006    Orientation RESPIRATION BLADDER Height & Weight     Self, Time, Place, Situation  Normal Continent Weight:   Height:     BEHAVIORAL SYMPTOMS/MOOD NEUROLOGICAL BOWEL NUTRITION STATUS      Continent Diet(Heart Healthy)   AMBULATORY STATUS COMMUNICATION OF NEEDS Skin   Extensive Assist Verbally Normal                       Personal Care Assistance Level of Assistance  Bathing, Dressing Bathing Assistance: Maximum assistance   Dressing Assistance: Maximum assistance     Functional Limitations Info             SPECIAL CARE FACTORS FREQUENCY  PT (By licensed PT), OT (By licensed OT)     PT Frequency: 5 OT Frequency: 5            Contractures      Additional Factors Info  Allergies, Code Status Code Status Info: Prior Allergies Info: Codeine Phosphate, Simvastatin           Current Medications (07/15/2018):  This is the current hospital active medication list No current facility-administered medications for this encounter.    Current Outpatient Medications  Medication Sig Dispense Refill  . atorvastatin (LIPITOR) 20 MG tablet TAKE 1 TABLET BY MOUTH EVERY DAY 90 tablet 3  . Calcium Carb-Cholecalciferol (CALCIUM CARBONATE-VITAMIN D3) 600-400 MG-UNIT TABS Take 1 tablet by mouth daily.    . celecoxib (CELEBREX) 200 MG capsule TAKE 1 CAPSULE BY MOUTH EVERY DAY WITH FOOD (Patient taking differently: Take 200 mg by mouth daily. ) 90 capsule 3  . diclofenac sodium (VOLTAREN) 1 % GEL APPLY 2 GRAMS EXTERNALLY TO THE AFFECTED AREA FOUR TIMES DAILY (Patient taking differently: Apply 2 g topically daily. ) 100 g 6  . diltiazem (CARDIZEM CD) 240 MG 24 hr capsule TAKE 1 CAPSULE(240 MG) BY MOUTH DAILY (Patient taking differently: Take 240 mg  by mouth daily. ) 90 capsule 3  . furosemide (LASIX) 80 MG tablet TAKE 1 TABLET BY MOUTH EVERY DAY 90 tablet 3  . gabapentin (NEURONTIN) 100 MG capsule TAKE 1 CAPSULE BY MOUTH THREE TIMES DAILY 270 capsule 3  . MAGNESIUM-OXIDE 400 (241.3 Mg) MG tablet TAKE 1 TABLET BY MOUTH DAILY 90 tablet 3  . metoprolol tartrate (LOPRESSOR) 50 MG tablet TAKE 1 TABLET BY MOUTH TWICE DAILY 180 tablet 3  . Multiple Vitamins-Minerals (HAIR/SKIN/NAILS) CAPS Take 3 capsules by  mouth daily.    Marland Kitchen omeprazole (PRILOSEC) 20 MG capsule TAKE 1 CAPSULE BY MOUTH EVERY DAY 90 capsule 3  . traZODone (DESYREL) 50 MG tablet TAKE 1/2 TO 1 TABLET BY MOUTH AT BEDTIME AS NEEDED FOR SLEEP (Patient taking differently: Take 50 mg by mouth at bedtime. ) 90 tablet 3  . TURMERIC PO Take 1 capsule by mouth daily.    Marland Kitchen ipratropium-albuterol (DUONEB) 0.5-2.5 (3) MG/3ML SOLN Take 3 mLs by nebulization 3 (three) times daily. (Patient not taking: Reported on 09/10/2017) 360 mL 0     Discharge Medications: Please see discharge summary for a list of discharge medications.  Relevant Imaging Results:  Relevant Lab Results:   Additional Information [id number]  Alphonse Guild , LCSWA

## 2018-07-15 NOTE — Progress Notes (Signed)
CSW requested EDP place orders for PT/OT and also a PT consult should either be needed, as advised by Claiborne Billings at [facility].  CSW will continue to follow for D/C needs.  Alphonse Guild. , LCSW, LCAS, CSI Clinical Social Worker Ph: [phone]

## 2018-07-15 NOTE — ED Notes (Signed)
Patient states that she only needs her Trazodone and her Lipitor at bedtime, the rest of the medications she takes in the AM.

## 2018-07-15 NOTE — ED Notes (Signed)
Pharmacy called about medication verification.

## 2018-07-16 ENCOUNTER — Ambulatory Visit: Payer: Medicare Other | Admitting: Physical Therapy

## 2018-07-16 DIAGNOSIS — Z7401 Bed confinement status: Secondary | ICD-10-CM | POA: Diagnosis not present

## 2018-07-16 DIAGNOSIS — I10 Essential (primary) hypertension: Secondary | ICD-10-CM | POA: Diagnosis not present

## 2018-07-16 DIAGNOSIS — M2550 Pain in unspecified joint: Secondary | ICD-10-CM | POA: Diagnosis not present

## 2018-07-16 DIAGNOSIS — S8262XA Displaced fracture of lateral malleolus of left fibula, initial encounter for closed fracture: Secondary | ICD-10-CM | POA: Diagnosis not present

## 2018-07-16 DIAGNOSIS — W19XXXA Unspecified fall, initial encounter: Secondary | ICD-10-CM | POA: Diagnosis not present

## 2018-07-16 NOTE — ED Notes (Signed)
Attempted to call. Was disconnected.

## 2018-07-16 NOTE — Progress Notes (Signed)
OT Cancellation Note  Patient Details Name: Karen Jackson MRN: [id number] DOB: February 16, 1941   Cancelled Treatment:    Reason Eval/Treat Not Completed: Other (comment)  Order received.  No MD note found. Awaiting clarification for weight bearing for ankle fx.    , 07/16/2018, 7:54 AM  Lesle Chris, OTR/L Acute Rehabilitation Services [phone] [facility] pager [phone] office 07/16/2018

## 2018-07-16 NOTE — Progress Notes (Addendum)
CSW received handoff from 2nd shift CSW. Per notes, patient is from home where she has around the clock care but that they are unable to life the patient due to her recent ankle injury. Per notes, patient and patient's family are requesting that patient go to a SNF for short-term rehab. Per notes, patient and patient family are aware that patient does not meet the 3-night inpatient qualifying stay and are agreeable to private pay. CSW aware patient's first choices were full and that they would like CSW to look into a few other facilities they have chosen but that [facility] is their next choice. CSW has reached out to [place] in admissions at [facility] who stated she would not know if they had open beds until 9:15am. CSW to follow up with [facility] then.   Ollen Barges, [place] Work Department  [name]  [phone]

## 2018-07-16 NOTE — Progress Notes (Signed)
PT Cancellation Note  Patient Details Name: Karen Jackson MRN: [id number] DOB: Jul 25, 1941   Cancelled Treatment:    Reason Eval/Treat Not Completed: PT screened, no needs identified, will sign off. Patient Has a [place] on left. Patient states plans to go to [place] today private pay. PT needs can be addressed at next venue. Patient aloso declined to mobilize at least sitting up, stating the left ankle pain is increased when moving.    Tresa Endo [place] Pager [phone] Office [phone]   07/16/2018, 9:34 AM

## 2018-07-16 NOTE — ED Notes (Signed)
Bed: WU13 Expected date:  Expected time:  Means of arrival:  Comments: Rm 4

## 2018-07-16 NOTE — ED Notes (Signed)
[facility] has been Notified and will be in route.

## 2018-07-16 NOTE — ED Notes (Signed)
Report given to [facility] LPN at [facility].  Phone Number [phone]

## 2018-07-16 NOTE — ED Notes (Signed)
Patient requested O2 be on.

## 2018-07-16 NOTE — Progress Notes (Signed)
OT Cancellation Note  Patient Details Name: Karen Jackson MRN: [id number] DOB: 05-13-1941   Cancelled Treatment:    Reason Eval/Treat Not Completed: Other (comment).  Noted pt declined mobility with PT and plans SNF/private pay. Will sign off.   , 07/16/2018, 10:16 AM  Lesle Chris, OTR/L Acute Rehabilitation Services [phone] [facility] pager [phone] office 07/16/2018

## 2018-07-16 NOTE — Progress Notes (Signed)
Patient has been accepted to [facility] for today 11/19. Patient, RN and EDP agreeable to discharge plan. CSW briefly spoke with nephew regarding discharge plans. Please call report to [phone]. PTAR to be called for transportation.   Ollen Barges, [place] Work Department  [name]  [phone]

## 2018-07-16 NOTE — ED Notes (Signed)
Spoke with Education officer, museum. Social worker will be attaining Phone number of facility and person to give report to.

## 2018-07-17 ENCOUNTER — Other Ambulatory Visit (HOSPITAL_COMMUNITY): Payer: Self-pay | Admitting: Orthopedic Surgery

## 2018-07-17 DIAGNOSIS — M1990 Unspecified osteoarthritis, unspecified site: Secondary | ICD-10-CM | POA: Diagnosis not present

## 2018-07-17 DIAGNOSIS — I480 Paroxysmal atrial fibrillation: Secondary | ICD-10-CM | POA: Diagnosis not present

## 2018-07-17 DIAGNOSIS — E119 Type 2 diabetes mellitus without complications: Secondary | ICD-10-CM | POA: Diagnosis not present

## 2018-07-17 DIAGNOSIS — S82842A Displaced bimalleolar fracture of left lower leg, initial encounter for closed fracture: Secondary | ICD-10-CM | POA: Diagnosis not present

## 2018-07-17 DIAGNOSIS — S8262XD Displaced fracture of lateral malleolus of left fibula, subsequent encounter for closed fracture with routine healing: Secondary | ICD-10-CM | POA: Diagnosis not present

## 2018-07-17 DIAGNOSIS — I69354 Hemiplegia and hemiparesis following cerebral infarction affecting left non-dominant side: Secondary | ICD-10-CM | POA: Diagnosis not present

## 2018-07-17 DIAGNOSIS — R2681 Unsteadiness on feet: Secondary | ICD-10-CM | POA: Diagnosis not present

## 2018-07-18 ENCOUNTER — Other Ambulatory Visit: Payer: Self-pay

## 2018-07-18 ENCOUNTER — Encounter (HOSPITAL_COMMUNITY): Payer: Self-pay | Admitting: *Deleted

## 2018-07-18 ENCOUNTER — Ambulatory Visit: Payer: Medicare Other | Admitting: Physical Therapy

## 2018-07-18 DIAGNOSIS — S8262XD Displaced fracture of lateral malleolus of left fibula, subsequent encounter for closed fracture with routine healing: Secondary | ICD-10-CM | POA: Diagnosis not present

## 2018-07-18 DIAGNOSIS — I69354 Hemiplegia and hemiparesis following cerebral infarction affecting left non-dominant side: Secondary | ICD-10-CM | POA: Diagnosis not present

## 2018-07-18 DIAGNOSIS — R2681 Unsteadiness on feet: Secondary | ICD-10-CM | POA: Diagnosis not present

## 2018-07-18 NOTE — Progress Notes (Signed)
Pt is a resident at [facility]. Spoke with Rudean Haskell, RN, Supervisor for pre-op call. She states pt is alert, oriented and can speak for herself. Pt has hx of A-fib and takes Diltiazem. Pt is a type 2 diabetic. Last A1C was 6.8 on 05/23/18. Tomisha states she does not have access to pt's CBG's. Pt will be arriving via [facility] transportation. I faxed the pre-op instructions to [place] at [phone].

## 2018-07-18 NOTE — Pre-Procedure Instructions (Signed)
   Karen Jackson  07/18/2018    [name]'s procedure is scheduled on Saturday, 07/20/18 at 7:30 AM.   Report to [facility] Emergency Dept Registration at 6:00 AM.   Call this number if you have problems the morning of surgery: [phone]   Remember:  Patient is not to eat or drink after midnight Friday, 07/19/18  Take these medicines the morning of surgery with A SIP OF WATER: Diltiazem (Cardizem), Gabapentin (Neurontin), Metoprolol (Lopressor), Omeprazole (Prilosec)  Please hold NSAIDS (Ibuprofen, Celebrex, Diclofenac, etc), Multivitamins and Herbal medications (Tumeric, etc) as of now prior to surgery.   Per Instructions from Dr. Suzette Battiest, Anesthesiologist/ Stan Head, RN    Do not wear jewelry, make-up or nail polish.  Do not wear lotions, powders, perfumes or deodorant.  Do not shave 48 hours prior to surgery.    Do not bring valuables to the hospital.  [facility] is not responsible for any belongings or valuables.  Contacts, dentures or bridgework may not be worn into surgery.  Leave your suitcase in the car.  After surgery it may be brought to your room.  For patients admitted to the hospital, discharge time will be determined by your treatment team.  Any questions today or tomorrow (Friday), please call me, Lilia Pro, RN at [phone]

## 2018-07-19 ENCOUNTER — Other Ambulatory Visit: Payer: Medicare Other

## 2018-07-19 DIAGNOSIS — I69354 Hemiplegia and hemiparesis following cerebral infarction affecting left non-dominant side: Secondary | ICD-10-CM | POA: Diagnosis not present

## 2018-07-19 DIAGNOSIS — R2681 Unsteadiness on feet: Secondary | ICD-10-CM | POA: Diagnosis not present

## 2018-07-19 DIAGNOSIS — S8262XD Displaced fracture of lateral malleolus of left fibula, subsequent encounter for closed fracture with routine healing: Secondary | ICD-10-CM | POA: Diagnosis not present

## 2018-07-20 ENCOUNTER — Inpatient Hospital Stay (HOSPITAL_COMMUNITY): Payer: Medicare Other | Admitting: Anesthesiology

## 2018-07-20 ENCOUNTER — Inpatient Hospital Stay (HOSPITAL_COMMUNITY)
Admission: RE | Admit: 2018-07-20 | Discharge: 2018-07-23 | DRG: 494 | Disposition: A | Discharge status: Skilled Nursing Facility | Payer: Medicare Other | Source: Skilled Nursing Facility | Attending: Orthopedic Surgery | Admitting: Orthopedic Surgery

## 2018-07-20 ENCOUNTER — Encounter (HOSPITAL_COMMUNITY): Payer: Self-pay | Admitting: Certified Registered Nurse Anesthetist

## 2018-07-20 ENCOUNTER — Encounter (HOSPITAL_COMMUNITY)
Admission: RE | Disposition: A | Discharge status: Skilled Nursing Facility | Payer: Self-pay | Source: Skilled Nursing Facility | Attending: Orthopedic Surgery

## 2018-07-20 DIAGNOSIS — I10 Essential (primary) hypertension: Secondary | ICD-10-CM | POA: Diagnosis not present

## 2018-07-20 DIAGNOSIS — E559 Vitamin D deficiency, unspecified: Secondary | ICD-10-CM | POA: Diagnosis present

## 2018-07-20 DIAGNOSIS — Z9049 Acquired absence of other specified parts of digestive tract: Secondary | ICD-10-CM | POA: Diagnosis not present

## 2018-07-20 DIAGNOSIS — Z6836 Body mass index (BMI) 36.0-36.9, adult: Secondary | ICD-10-CM

## 2018-07-20 DIAGNOSIS — Z885 Allergy status to narcotic agent status: Secondary | ICD-10-CM

## 2018-07-20 DIAGNOSIS — Z888 Allergy status to other drugs, medicaments and biological substances status: Secondary | ICD-10-CM | POA: Diagnosis not present

## 2018-07-20 DIAGNOSIS — R41841 Cognitive communication deficit: Secondary | ICD-10-CM | POA: Diagnosis not present

## 2018-07-20 DIAGNOSIS — S82892A Other fracture of left lower leg, initial encounter for closed fracture: Secondary | ICD-10-CM | POA: Diagnosis present

## 2018-07-20 DIAGNOSIS — E785 Hyperlipidemia, unspecified: Secondary | ICD-10-CM | POA: Diagnosis present

## 2018-07-20 DIAGNOSIS — S93422A Sprain of deltoid ligament of left ankle, initial encounter: Secondary | ICD-10-CM | POA: Diagnosis present

## 2018-07-20 DIAGNOSIS — Y92009 Unspecified place in unspecified non-institutional (private) residence as the place of occurrence of the external cause: Secondary | ICD-10-CM | POA: Diagnosis not present

## 2018-07-20 DIAGNOSIS — M6281 Muscle weakness (generalized): Secondary | ICD-10-CM | POA: Diagnosis not present

## 2018-07-20 DIAGNOSIS — R278 Other lack of coordination: Secondary | ICD-10-CM | POA: Diagnosis not present

## 2018-07-20 DIAGNOSIS — I110 Hypertensive heart disease with heart failure: Secondary | ICD-10-CM | POA: Diagnosis present

## 2018-07-20 DIAGNOSIS — Z9012 Acquired absence of left breast and nipple: Secondary | ICD-10-CM

## 2018-07-20 DIAGNOSIS — Z87891 Personal history of nicotine dependence: Secondary | ICD-10-CM | POA: Diagnosis not present

## 2018-07-20 DIAGNOSIS — Z833 Family history of diabetes mellitus: Secondary | ICD-10-CM | POA: Diagnosis not present

## 2018-07-20 DIAGNOSIS — G8918 Other acute postprocedural pain: Secondary | ICD-10-CM | POA: Diagnosis not present

## 2018-07-20 DIAGNOSIS — I69354 Hemiplegia and hemiparesis following cerebral infarction affecting left non-dominant side: Secondary | ICD-10-CM | POA: Diagnosis not present

## 2018-07-20 DIAGNOSIS — W19XXXA Unspecified fall, initial encounter: Secondary | ICD-10-CM | POA: Diagnosis present

## 2018-07-20 DIAGNOSIS — E119 Type 2 diabetes mellitus without complications: Secondary | ICD-10-CM | POA: Diagnosis present

## 2018-07-20 DIAGNOSIS — S82842A Displaced bimalleolar fracture of left lower leg, initial encounter for closed fracture: Principal | ICD-10-CM | POA: Diagnosis present

## 2018-07-20 DIAGNOSIS — Z8673 Personal history of transient ischemic attack (TIA), and cerebral infarction without residual deficits: Secondary | ICD-10-CM | POA: Diagnosis not present

## 2018-07-20 DIAGNOSIS — K219 Gastro-esophageal reflux disease without esophagitis: Secondary | ICD-10-CM | POA: Diagnosis present

## 2018-07-20 DIAGNOSIS — S8262XS Displaced fracture of lateral malleolus of left fibula, sequela: Secondary | ICD-10-CM | POA: Diagnosis not present

## 2018-07-20 DIAGNOSIS — I509 Heart failure, unspecified: Secondary | ICD-10-CM | POA: Diagnosis present

## 2018-07-20 DIAGNOSIS — R2681 Unsteadiness on feet: Secondary | ICD-10-CM | POA: Diagnosis not present

## 2018-07-20 DIAGNOSIS — M2550 Pain in unspecified joint: Secondary | ICD-10-CM | POA: Diagnosis not present

## 2018-07-20 DIAGNOSIS — M8580 Other specified disorders of bone density and structure, unspecified site: Secondary | ICD-10-CM | POA: Diagnosis present

## 2018-07-20 DIAGNOSIS — S8262XD Displaced fracture of lateral malleolus of left fibula, subsequent encounter for closed fracture with routine healing: Secondary | ICD-10-CM | POA: Diagnosis not present

## 2018-07-20 DIAGNOSIS — Z4789 Encounter for other orthopedic aftercare: Secondary | ICD-10-CM | POA: Diagnosis not present

## 2018-07-20 DIAGNOSIS — Z7401 Bed confinement status: Secondary | ICD-10-CM | POA: Diagnosis not present

## 2018-07-20 DIAGNOSIS — R2689 Other abnormalities of gait and mobility: Secondary | ICD-10-CM | POA: Diagnosis not present

## 2018-07-20 HISTORY — PX: ORIF ANKLE FRACTURE: SHX5408

## 2018-07-20 HISTORY — DX: Prediabetes: R73.03

## 2018-07-20 LAB — CBC
HEMATOCRIT: 43.7 % (ref 36.0–46.0)
HEMOGLOBIN: 14.1 g/dL (ref 12.0–15.0)
MCH: 31.9 pg (ref 26.0–34.0)
MCHC: 32.3 g/dL (ref 30.0–36.0)
MCV: 98.9 fL (ref 80.0–100.0)
NRBC: 0 % (ref 0.0–0.2)
Platelets: 362 10*3/uL (ref 150–400)
RBC: 4.42 MIL/uL (ref 3.87–5.11)
RDW: 12.6 % (ref 11.5–15.5)
WBC: 9.1 10*3/uL (ref 4.0–10.5)

## 2018-07-20 LAB — BASIC METABOLIC PANEL
ANION GAP: 11 (ref 5–15)
BUN: 19 mg/dL (ref 8–23)
CHLORIDE: 99 mmol/L (ref 98–111)
CO2: 28 mmol/L (ref 22–32)
Calcium: 9.3 mg/dL (ref 8.9–10.3)
Creatinine, Ser: 0.75 mg/dL (ref 0.44–1.00)
GFR calc Af Amer: >60 mL/min (ref >60)
GFR calc non Af Amer: >60 mL/min (ref >60)
GLUCOSE: 154 mg/dL — AB (ref 70–99)
POTASSIUM: 3.5 mmol/L (ref 3.5–5.1)
Sodium: 138 mmol/L (ref 135–145)

## 2018-07-20 LAB — CREATININE, SERUM
Creatinine, Ser: 0.58 mg/dL (ref 0.44–1.00)
GFR calc non Af Amer: >60 mL/min (ref >60)

## 2018-07-20 SURGERY — OPEN REDUCTION INTERNAL FIXATION (ORIF) ANKLE FRACTURE
Anesthesia: General | Site: Ankle | Laterality: Left

## 2018-07-20 MED ORDER — IPRATROPIUM-ALBUTEROL 0.5-2.5 (3) MG/3ML IN SOLN
3.0000 mL | Freq: Three times a day (TID) | RESPIRATORY_TRACT | Status: DC
  Filled 2018-07-20: qty 3

## 2018-07-20 MED ORDER — MAGNESIUM OXIDE 400 (241.3 MG) MG PO TABS
400.0000 mg | ORAL_TABLET | Freq: Every day | ORAL | Status: DC
  Administered 2018-07-21 – 2018-07-23 (×3): 400 mg via ORAL
  Filled 2018-07-20 (×3): qty 1

## 2018-07-20 MED ORDER — GABAPENTIN 100 MG PO CAPS
100.0000 mg | ORAL_CAPSULE | Freq: Three times a day (TID) | ORAL | Status: DC
  Administered 2018-07-20 – 2018-07-23 (×7): 100 mg via ORAL
  Filled 2018-07-20 (×7): qty 1

## 2018-07-20 MED ORDER — DILTIAZEM HCL ER COATED BEADS 240 MG PO CP24
240.0000 mg | ORAL_CAPSULE | Freq: Every day | ORAL | Status: DC
  Administered 2018-07-21 – 2018-07-23 (×3): 240 mg via ORAL
  Filled 2018-07-20 (×3): qty 1

## 2018-07-20 MED ORDER — PHENYLEPHRINE 40 MCG/ML (10ML) SYRINGE FOR IV PUSH (FOR BLOOD PRESSURE SUPPORT)
PREFILLED_SYRINGE | INTRAVENOUS | Status: DC | PRN
  Administered 2018-07-20: 80 ug via INTRAVENOUS

## 2018-07-20 MED ORDER — HYDROCODONE-ACETAMINOPHEN 5-325 MG PO TABS
1.0000 | ORAL_TABLET | ORAL | Status: DC | PRN
  Administered 2018-07-20 – 2018-07-22 (×10): 1 via ORAL
  Administered 2018-07-23: 2 via ORAL
  Filled 2018-07-20 (×9): qty 1
  Filled 2018-07-20: qty 2
  Filled 2018-07-20: qty 1

## 2018-07-20 MED ORDER — SENNA 8.6 MG PO TABS
1.0000 | ORAL_TABLET | Freq: Two times a day (BID) | ORAL | Status: DC
  Administered 2018-07-20 – 2018-07-23 (×5): 8.6 mg via ORAL
  Filled 2018-07-20 (×5): qty 1

## 2018-07-20 MED ORDER — DOCUSATE SODIUM 100 MG PO CAPS
100.0000 mg | ORAL_CAPSULE | Freq: Two times a day (BID) | ORAL | Status: DC
  Administered 2018-07-21 – 2018-07-23 (×5): 100 mg via ORAL
  Filled 2018-07-20 (×6): qty 1

## 2018-07-20 MED ORDER — CHLORHEXIDINE GLUCONATE 4 % EX LIQD: 60.0000 mL | Freq: Once | CUTANEOUS | Status: DC

## 2018-07-20 MED ORDER — DEXAMETHASONE SODIUM PHOSPHATE 10 MG/ML IJ SOLN
INTRAMUSCULAR | Status: AC
  Filled 2018-07-20: qty 1

## 2018-07-20 MED ORDER — LORAZEPAM 1 MG PO TABS: 1.0000 mg | ORAL_TABLET | Freq: Four times a day (QID) | ORAL | Status: AC | PRN

## 2018-07-20 MED ORDER — ROPIVACAINE HCL 5 MG/ML IJ SOLN
INTRAMUSCULAR | Status: DC | PRN
  Administered 2018-07-20: 40 mL via PERINEURAL

## 2018-07-20 MED ORDER — METOPROLOL TARTRATE 50 MG PO TABS
ORAL_TABLET | ORAL | Status: AC
  Filled 2018-07-20: qty 1

## 2018-07-20 MED ORDER — METOPROLOL TARTRATE 50 MG PO TABS
50.0000 mg | ORAL_TABLET | Freq: Two times a day (BID) | ORAL | Status: DC
  Administered 2018-07-21 – 2018-07-23 (×3): 50 mg via ORAL
  Filled 2018-07-20 (×5): qty 1

## 2018-07-20 MED ORDER — CALCIUM CARBONATE-VITAMIN D 500-200 MG-UNIT PO TABS
1.0000 | ORAL_TABLET | Freq: Every day | ORAL | Status: DC
  Administered 2018-07-21 – 2018-07-23 (×3): 1 via ORAL
  Filled 2018-07-20 (×3): qty 1

## 2018-07-20 MED ORDER — SODIUM CHLORIDE 0.9 % IV SOLN: INTRAVENOUS | Status: DC

## 2018-07-20 MED ORDER — PANTOPRAZOLE SODIUM 40 MG PO TBEC
40.0000 mg | DELAYED_RELEASE_TABLET | Freq: Every day | ORAL | Status: DC
  Administered 2018-07-21 – 2018-07-23 (×3): 40 mg via ORAL
  Filled 2018-07-20 (×3): qty 1

## 2018-07-20 MED ORDER — LIDOCAINE 2% (20 MG/ML) 5 ML SYRINGE
INTRAMUSCULAR | Status: AC
  Filled 2018-07-20: qty 5

## 2018-07-20 MED ORDER — PROPOFOL 10 MG/ML IV BOLUS
INTRAVENOUS | Status: DC | PRN
  Administered 2018-07-20: 10 mg via INTRAVENOUS
  Administered 2018-07-20: 120 mg via INTRAVENOUS

## 2018-07-20 MED ORDER — MORPHINE SULFATE (PF) 2 MG/ML IV SOLN: 0.5000 mg | INTRAVENOUS | Status: DC | PRN

## 2018-07-20 MED ORDER — ONDANSETRON HCL 4 MG PO TABS: 4.0000 mg | ORAL_TABLET | Freq: Four times a day (QID) | ORAL | Status: DC | PRN

## 2018-07-20 MED ORDER — FENTANYL CITRATE (PF) 100 MCG/2ML IJ SOLN
INTRAMUSCULAR | Status: DC | PRN
  Administered 2018-07-20 (×2): 50 ug via INTRAVENOUS

## 2018-07-20 MED ORDER — LACTATED RINGERS IV SOLN
INTRAVENOUS | Status: DC
  Administered 2018-07-20: 07:00:00 via INTRAVENOUS

## 2018-07-20 MED ORDER — DIPHENHYDRAMINE HCL 12.5 MG/5ML PO ELIX: 12.5000 mg | ORAL_SOLUTION | ORAL | Status: DC | PRN

## 2018-07-20 MED ORDER — METOPROLOL TARTRATE 50 MG PO TABS
50.0000 mg | ORAL_TABLET | Freq: Once | ORAL | Status: AC
  Administered 2018-07-20: 50 mg via ORAL

## 2018-07-20 MED ORDER — ENOXAPARIN SODIUM 40 MG/0.4ML ~~LOC~~ SOLN
40.0000 mg | SUBCUTANEOUS | Status: DC
  Administered 2018-07-21 – 2018-07-23 (×3): 40 mg via SUBCUTANEOUS
  Filled 2018-07-20 (×3): qty 0.4

## 2018-07-20 MED ORDER — LORAZEPAM 2 MG/ML IJ SOLN: 1.0000 mg | Freq: Four times a day (QID) | INTRAMUSCULAR | Status: AC | PRN

## 2018-07-20 MED ORDER — ADULT MULTIVITAMIN W/MINERALS CH
1.0000 | ORAL_TABLET | Freq: Every day | ORAL | Status: DC
  Administered 2018-07-21 – 2018-07-23 (×3): 1 via ORAL
  Filled 2018-07-20 (×3): qty 1

## 2018-07-20 MED ORDER — VANCOMYCIN HCL 1000 MG IV SOLR
INTRAVENOUS | Status: DC | PRN
  Administered 2018-07-20: 1000 mg via TOPICAL

## 2018-07-20 MED ORDER — ACETAMINOPHEN 325 MG PO TABS
325.0000 mg | ORAL_TABLET | Freq: Four times a day (QID) | ORAL | Status: DC | PRN
  Filled 2018-07-20: qty 2

## 2018-07-20 MED ORDER — ATORVASTATIN CALCIUM 20 MG PO TABS
20.0000 mg | ORAL_TABLET | Freq: Every day | ORAL | Status: DC
  Administered 2018-07-20 – 2018-07-23 (×3): 20 mg via ORAL
  Filled 2018-07-20: qty 1
  Filled 2018-07-20: qty 2
  Filled 2018-07-20: qty 1
  Filled 2018-07-20: qty 2
  Filled 2018-07-20 (×2): qty 1
  Filled 2018-07-20: qty 2

## 2018-07-20 MED ORDER — PROPOFOL 10 MG/ML IV BOLUS
INTRAVENOUS | Status: AC
  Filled 2018-07-20: qty 20

## 2018-07-20 MED ORDER — ONDANSETRON HCL 4 MG/2ML IJ SOLN
INTRAMUSCULAR | Status: AC
  Filled 2018-07-20: qty 2

## 2018-07-20 MED ORDER — HYDROCODONE-ACETAMINOPHEN 7.5-325 MG PO TABS
1.0000 | ORAL_TABLET | ORAL | Status: DC | PRN
  Administered 2018-07-23: 2 via ORAL
  Filled 2018-07-20: qty 2

## 2018-07-20 MED ORDER — LIDOCAINE 2% (20 MG/ML) 5 ML SYRINGE
INTRAMUSCULAR | Status: DC | PRN
  Administered 2018-07-20: 80 mg via INTRAVENOUS

## 2018-07-20 MED ORDER — ONDANSETRON HCL 4 MG/2ML IJ SOLN: 4.0000 mg | Freq: Four times a day (QID) | INTRAMUSCULAR | Status: DC | PRN

## 2018-07-20 MED ORDER — FUROSEMIDE 40 MG PO TABS
80.0000 mg | ORAL_TABLET | Freq: Every day | ORAL | Status: DC
  Administered 2018-07-21 – 2018-07-23 (×3): 80 mg via ORAL
  Filled 2018-07-20: qty 1
  Filled 2018-07-20 (×2): qty 2
  Filled 2018-07-20: qty 1
  Filled 2018-07-20: qty 2
  Filled 2018-07-20: qty 1

## 2018-07-20 MED ORDER — VITAMIN B-1 100 MG PO TABS
100.0000 mg | ORAL_TABLET | Freq: Every day | ORAL | Status: DC
  Administered 2018-07-21 – 2018-07-23 (×3): 100 mg via ORAL
  Filled 2018-07-20 (×3): qty 1

## 2018-07-20 MED ORDER — VANCOMYCIN HCL 1000 MG IV SOLR
INTRAVENOUS | Status: AC
  Filled 2018-07-20: qty 1000

## 2018-07-20 MED ORDER — METHOCARBAMOL 1000 MG/10ML IJ SOLN
500.0000 mg | Freq: Four times a day (QID) | INTRAVENOUS | Status: DC | PRN
  Filled 2018-07-20: qty 5

## 2018-07-20 MED ORDER — FOLIC ACID 1 MG PO TABS
1.0000 mg | ORAL_TABLET | Freq: Every day | ORAL | Status: DC
  Administered 2018-07-21 – 2018-07-23 (×3): 1 mg via ORAL
  Filled 2018-07-20 (×3): qty 1

## 2018-07-20 MED ORDER — 0.9 % SODIUM CHLORIDE (POUR BTL) OPTIME
TOPICAL | Status: DC | PRN
  Administered 2018-07-20: 1000 mL

## 2018-07-20 MED ORDER — METHOCARBAMOL 500 MG PO TABS
500.0000 mg | ORAL_TABLET | Freq: Four times a day (QID) | ORAL | Status: DC | PRN
  Administered 2018-07-23: 500 mg via ORAL
  Filled 2018-07-20: qty 1

## 2018-07-20 MED ORDER — CEFAZOLIN SODIUM-DEXTROSE 2-4 GM/100ML-% IV SOLN
2.0000 g | INTRAVENOUS | Status: AC
  Administered 2018-07-20: 2 g via INTRAVENOUS
  Filled 2018-07-20: qty 100

## 2018-07-20 MED ORDER — FENTANYL CITRATE (PF) 250 MCG/5ML IJ SOLN
INTRAMUSCULAR | Status: AC
  Filled 2018-07-20: qty 5

## 2018-07-20 MED ORDER — THIAMINE HCL 100 MG/ML IJ SOLN: 100.0000 mg | Freq: Every day | INTRAMUSCULAR | Status: DC

## 2018-07-20 MED ORDER — TRAZODONE HCL 50 MG PO TABS
50.0000 mg | ORAL_TABLET | Freq: Every day | ORAL | Status: DC
  Administered 2018-07-22: 50 mg via ORAL
  Filled 2018-07-20 (×3): qty 1

## 2018-07-20 MED ORDER — ONDANSETRON HCL 4 MG/2ML IJ SOLN
INTRAMUSCULAR | Status: DC | PRN
  Administered 2018-07-20: 4 mg via INTRAVENOUS

## 2018-07-20 MED ORDER — DEXAMETHASONE SODIUM PHOSPHATE 10 MG/ML IJ SOLN
INTRAMUSCULAR | Status: DC | PRN
  Administered 2018-07-20: 10 mg via INTRAVENOUS

## 2018-07-20 MED ORDER — PHENYLEPHRINE 40 MCG/ML (10ML) SYRINGE FOR IV PUSH (FOR BLOOD PRESSURE SUPPORT)
PREFILLED_SYRINGE | INTRAVENOUS | Status: AC
  Filled 2018-07-20: qty 10

## 2018-07-20 MED ORDER — ROCURONIUM BROMIDE 50 MG/5ML IV SOSY
PREFILLED_SYRINGE | INTRAVENOUS | Status: AC
  Filled 2018-07-20: qty 5

## 2018-07-20 SURGICAL SUPPLY — 59 items
ALCOHOL 70% 16 OZ (MISCELLANEOUS) ×3 IMPLANT
ANCH SUT 1 SHRT SM RGD INSRTR (Anchor) ×1 IMPLANT
ANCHOR SUT 1.45 SZ 1 SHORT (Anchor) ×3 IMPLANT
BANDAGE ESMARK 6X9 LF (GAUZE/BANDAGES/DRESSINGS) ×1 IMPLANT
BIT DRILL 2.5X2.75 QC CALB (BIT) ×2 IMPLANT
BIT DRILL 3.5X5.5 QC CALB (BIT) ×3 IMPLANT
BLADE SURG 15 STRL LF DISP TIS (BLADE) ×1 IMPLANT
BLADE SURG 15 STRL SS (BLADE) ×3
BNDG CMPR 9X6 STRL LF SNTH (GAUZE/BANDAGES/DRESSINGS) ×1
BNDG COHESIVE 4X5 TAN STRL (GAUZE/BANDAGES/DRESSINGS) ×2 IMPLANT
BNDG COHESIVE 6X5 TAN STRL LF (GAUZE/BANDAGES/DRESSINGS) ×2 IMPLANT
BNDG ESMARK 6X9 LF (GAUZE/BANDAGES/DRESSINGS) ×3
CANISTER SUCT 3000ML PPV (MISCELLANEOUS) ×3 IMPLANT
CHLORAPREP W/TINT 26ML (MISCELLANEOUS) ×6 IMPLANT
COVER SURGICAL LIGHT HANDLE (MISCELLANEOUS) ×3 IMPLANT
COVER WAND RF STERILE (DRAPES) ×3 IMPLANT
CUFF TOURNIQUET SINGLE 34IN LL (TOURNIQUET CUFF) ×3 IMPLANT
DRAPE OEC MINIVIEW 54X84 (DRAPES) ×3 IMPLANT
DRAPE U-SHAPE 47X51 STRL (DRAPES) ×3 IMPLANT
DRSG MEPITEL 4X7.2 (GAUZE/BANDAGES/DRESSINGS) ×2 IMPLANT
ELECT REM PT RETURN 9FT ADLT (ELECTROSURGICAL) ×3
ELECTRODE REM PT RTRN 9FT ADLT (ELECTROSURGICAL) ×1 IMPLANT
GAUZE SPONGE 4X4 12PLY STRL (GAUZE/BANDAGES/DRESSINGS) ×2 IMPLANT
GLOVE BIO SURGEON STRL SZ8 (GLOVE) ×3 IMPLANT
GLOVE BIOGEL PI IND STRL 8 (GLOVE) ×1 IMPLANT
GLOVE BIOGEL PI INDICATOR 8 (GLOVE) ×2
GOWN STRL REUS W/ TWL LRG LVL3 (GOWN DISPOSABLE) ×1 IMPLANT
GOWN STRL REUS W/ TWL XL LVL3 (GOWN DISPOSABLE) ×2 IMPLANT
GOWN STRL REUS W/TWL LRG LVL3 (GOWN DISPOSABLE) ×3
GOWN STRL REUS W/TWL XL LVL3 (GOWN DISPOSABLE) ×3
KIT BASIN OR (CUSTOM PROCEDURE TRAY) ×3 IMPLANT
KIT TURNOVER KIT B (KITS) ×3 IMPLANT
NS IRRIG 1000ML POUR BTL (IV SOLUTION) ×3 IMPLANT
PACK ORTHO EXTREMITY (CUSTOM PROCEDURE TRAY) ×3 IMPLANT
PAD ABD 8X10 STRL (GAUZE/BANDAGES/DRESSINGS) ×4 IMPLANT
PAD ARMBOARD 7.5X6 YLW CONV (MISCELLANEOUS) ×6 IMPLANT
PAD CAST 4YDX4 CTTN HI CHSV (CAST SUPPLIES) IMPLANT
PADDING CAST COTTON 4X4 STRL (CAST SUPPLIES) ×3
PADDING CAST COTTON 6X4 STRL (CAST SUPPLIES) ×3 IMPLANT
PLATE LOCK 7H 92 BILAT FIB (Plate) ×3 IMPLANT
SCREW CORTICAL 3.5MM 22MM (Screw) ×3 IMPLANT
SCREW LOCK 3.5X10 DIST TIB (Screw) ×2 IMPLANT
SCREW LOCK CORT STAR 3.5X10 (Screw) ×3 IMPLANT
SCREW LOCK CORT STAR 3.5X14 (Screw) ×4 IMPLANT
SCREW LOW PROFILE 18MMX3.5MM (Screw) ×2 IMPLANT
SCREW NON LOCKING LP 3.5 16MM (Screw) ×2 IMPLANT
SPLINT PLASTER CAST XFAST 5X30 (CAST SUPPLIES) IMPLANT
SPLINT PLASTER XFAST SET 5X30 (CAST SUPPLIES) ×2
SUCTION FRAZIER HANDLE 10FR (MISCELLANEOUS) ×2
SUCTION TUBE FRAZIER 10FR DISP (MISCELLANEOUS) ×1 IMPLANT
SUT ETHILON 3 0 PS 1 (SUTURE) ×6 IMPLANT
SUT MNCRL AB 3-0 PS2 18 (SUTURE) ×2 IMPLANT
SUT VIC AB 0 CT1 27 (SUTURE) ×3
SUT VIC AB 0 CT1 27XBRD ANBCTR (SUTURE) IMPLANT
SUT VIC AB 2-0 CT1 27 (SUTURE) ×3
SUT VIC AB 2-0 CT1 TAPERPNT 27 (SUTURE) ×2 IMPLANT
TOWEL OR 17X26 10 PK STRL BLUE (TOWEL DISPOSABLE) ×3 IMPLANT
TUBE CONNECTING 12'X1/4 (SUCTIONS) ×1
TUBE CONNECTING 12X1/4 (SUCTIONS) ×2 IMPLANT

## 2018-07-20 NOTE — Anesthesia Procedure Notes (Signed)
Anesthesia Regional Block: Popliteal block   Pre-Anesthetic Checklist: ,, timeout performed, Correct Patient, Correct Site, Correct Laterality, Correct Procedure, Correct Position, site marked, Risks and benefits discussed,  Surgical consent,  Pre-op evaluation,  At surgeon's request and post-op pain management  Laterality: Left  Prep: chloraprep       Needles:  Injection technique: Single-shot  Needle Type: Echogenic Needle     Needle Length: 9cm  Needle Gauge: 21     Additional Needles:   Procedures:,,,, ultrasound used (permanent image in chart),,,,  Narrative:  Start time: 07/20/2018 7:10 AM End time: 07/20/2018 7:15 AM Injection made incrementally with aspirations every 5 mL.  Performed by: Personally  Anesthesiologist: [name], MD  Additional Notes: No pain on injection. No increased resistance to injection. Injection made in 5cc increments.  Good needle visualization.  Patient tolerated procedure well.

## 2018-07-20 NOTE — Anesthesia Postprocedure Evaluation (Signed)
Anesthesia Post Note  Patient: Karen Jackson  Procedure(s) Performed: OPEN REDUCTION INTERNAL FIXATION (ORIF) ANKLE FRACTURE (Left Ankle)     Patient location during evaluation: PACU Anesthesia Type: General Level of consciousness: awake and alert, awake and oriented Pain management: pain level controlled Vital Signs Assessment: post-procedure vital signs reviewed and stable Respiratory status: spontaneous breathing, nonlabored ventilation and respiratory function stable Cardiovascular status: blood pressure returned to baseline and stable Postop Assessment: no apparent nausea or vomiting Anesthetic complications: no    Last Vitals:  Vitals:   07/20/18 0931 07/20/18 0959  BP:  137/69  Pulse:  73  Resp:  18  Temp:  36.8 C  SpO2: 95% 98%    Last Pain:  Vitals:   07/20/18 0959  TempSrc: Oral  PainSc:                  [name]

## 2018-07-20 NOTE — Anesthesia Procedure Notes (Signed)
Anesthesia Regional Block: Adductor canal block   Pre-Anesthetic Checklist: ,, timeout performed, Correct Patient, Correct Site, Correct Laterality, Correct Procedure, Correct Position, site marked, Risks and benefits discussed,  Surgical consent,  Pre-op evaluation,  At surgeon's request and post-op pain management  Laterality: Left  Prep: chloraprep       Needles:  Injection technique: Single-shot  Needle Type: Echogenic Needle     Needle Length: 9cm  Needle Gauge: 21     Additional Needles:   Procedures:,,,, ultrasound used (permanent image in chart),,,,  Narrative:  Start time: 07/20/2018 7:15 AM End time: 07/20/2018 7:20 AM Injection made incrementally with aspirations every 5 mL.  Performed by: Personally  Anesthesiologist: [name], MD  Additional Notes: No pain on injection. No increased resistance to injection. Injection made in 5cc increments.  Good needle visualization.  Patient tolerated procedure well.

## 2018-07-20 NOTE — H&P (Signed)
Karen Jackson is an 77 y.o. female.   Chief Complaint: Left ankle pain HPI: The patient is a 77 year old female who fell a few days ago injuring her left ankle.  She sustained a bimalleolar fracture dislocation.  She was initially reduced and splinted in the emergency room and presents today for operative treatment of this displaced and unstable ankle injury.  Past Medical History:  Diagnosis Date  . Acute cystitis without hematuria   . Acute diastolic CHF (congestive heart failure) ([place])   . Arthritis   . Dyspnea   . Fever of unknown origin 03/19/2017  . Hyperlipidemia   . Hypertension   . Multifocal pneumonia   . Osteopenia   . Persistent atrial fibrillation   . Pre-diabetes   . Stroke [facility]) 2013    Past Surgical History:  Procedure Laterality Date  . ANKLE SURGERY    . APPENDECTOMY    . CHOLECYSTECTOMY    . HERNIA REPAIR     Esophagus  . JOINT REPLACEMENT     total- right partial- left  . MASTECTOMY PARTIAL / LUMPECTOMY  2012   left    Family History  Problem Relation Age of Onset  . Diabetes Mother    Social History:  reports that she quit smoking about 37 years ago. Her smoking use included cigarettes. She started smoking about 60 years ago. She has a 11.50 pack-year smoking history. She has never used smokeless tobacco. She reports that she drinks about 14.0 standard drinks of alcohol per week. She reports that she does not use drugs.  Allergies:  Allergies  Allergen Reactions  . Codeine Phosphate Nausea And Vomiting  . Simvastatin Other (See Comments)    myalgia    Medications Prior to Admission  Medication Sig Dispense Refill  . atorvastatin (LIPITOR) 20 MG tablet TAKE 1 TABLET BY MOUTH EVERY DAY 90 tablet 3  . Calcium Carb-Cholecalciferol (CALCIUM CARBONATE-VITAMIN D3) 600-400 MG-UNIT TABS Take 1 tablet by mouth daily.    . celecoxib (CELEBREX) 200 MG capsule TAKE 1 CAPSULE BY MOUTH EVERY DAY WITH FOOD (Patient taking differently: Take 200 mg by mouth daily.  ) 90 capsule 3  . diclofenac sodium (VOLTAREN) 1 % GEL APPLY 2 GRAMS EXTERNALLY TO THE AFFECTED AREA FOUR TIMES DAILY (Patient taking differently: Apply 2 g topically daily. ) 100 g 6  . diltiazem (CARDIZEM CD) 240 MG 24 hr capsule TAKE 1 CAPSULE(240 MG) BY MOUTH DAILY (Patient taking differently: Take 240 mg by mouth daily. ) 90 capsule 3  . furosemide (LASIX) 80 MG tablet TAKE 1 TABLET BY MOUTH EVERY DAY 90 tablet 3  . gabapentin (NEURONTIN) 100 MG capsule TAKE 1 CAPSULE BY MOUTH THREE TIMES DAILY 270 capsule 3  . MAGNESIUM-OXIDE 400 (241.3 Mg) MG tablet TAKE 1 TABLET BY MOUTH DAILY 90 tablet 3  . metoprolol tartrate (LOPRESSOR) 50 MG tablet TAKE 1 TABLET BY MOUTH TWICE DAILY 180 tablet 3  . Multiple Vitamins-Minerals (HAIR/SKIN/NAILS) CAPS Take 3 capsules by mouth daily.    Marland Kitchen omeprazole (PRILOSEC) 20 MG capsule TAKE 1 CAPSULE BY MOUTH EVERY DAY 90 capsule 3  . traZODone (DESYREL) 50 MG tablet TAKE 1/2 TO 1 TABLET BY MOUTH AT BEDTIME AS NEEDED FOR SLEEP (Patient taking differently: Take 50 mg by mouth at bedtime. ) 90 tablet 3  . TURMERIC PO Take 1 capsule by mouth daily.    Marland Kitchen ipratropium-albuterol (DUONEB) 0.5-2.5 (3) MG/3ML SOLN Take 3 mLs by nebulization 3 (three) times daily. (Patient not taking: Reported on 09/10/2017)  360 mL 0    No results found for this or any previous visit (from the past 48 hour(s)). No results found.  ROS no recent fever, chills, nausea, vomiting or changes in her appetite  Blood pressure (!) 132/56, pulse 76, temperature 97.8 F (36.6 C), temperature source Oral, resp. rate 20, height 4\' 10"  (1.473 m), weight 78.2 kg, SpO2 93 %. Physical Exam  Well-nourished well-developed woman in no apparent distress.  Alert and oriented x4.  Mood and affect are normal.  Extraocular motions are intact.  Respirations are unlabored.  Gait is nonweightbearing on the left.  Left ankle has moderate swelling.  Skin is healthy and intact.  Pulses are palpable in the foot.  No  lymphadenopathy.  Brisk capillary refill of the toes.  Patient wiggles her toes.  Assessment/Plan Left ankle bimalleolar fracture dislocation -to the operating room for open treatment with internal fixation.  The risks and benefits of the alternative treatment options have been discussed in detail.  The patient wishes to proceed with surgery and specifically understands risks of bleeding, infection, nerve damage, blood clots, need for additional surgery, amputation and death.   Wylene Simmer, MD 08/04/18, 7:24 AM

## 2018-07-20 NOTE — Transfer of Care (Signed)
Immediate Anesthesia Transfer of Care Note  Patient: Karen Jackson  Procedure(s) Performed: OPEN REDUCTION INTERNAL FIXATION (ORIF) ANKLE FRACTURE (Left Ankle)  Patient Location: PACU  Anesthesia Type:General and Regional  Level of Consciousness: awake, patient cooperative and responds to stimulation  Airway & Oxygen Therapy: Patient Spontanous Breathing and Patient connected to nasal cannula oxygen  Post-op Assessment: Report given to RN and Post -op Vital signs reviewed and stable  Post vital signs: Reviewed and stable  Last Vitals:  Vitals Value Taken Time  BP 141/54 07/20/2018  9:00 AM  Temp 36.4 C 07/20/2018  9:00 AM  Pulse 70 07/20/2018  9:00 AM  Resp 15 07/20/2018  9:01 AM  SpO2 97 % 07/20/2018  9:00 AM  Vitals shown include unvalidated device data.  Last Pain:  Vitals:   07/20/18 0900  TempSrc:   PainSc: (P) 0-No pain         Complications: No apparent anesthesia complications

## 2018-07-20 NOTE — Progress Notes (Addendum)
Orthopedic Tech Progress Note Patient Details:  Karen Jackson 02/10/1941 [id number]  Ortho Devices Type of Ortho Device: CAM walker Ortho Device/Splint Location: lle Ortho Device/Splint Interventions: Ordered, Application, Adjustment   Post Interventions Patient Tolerated: Well Instructions Provided: Care of device, Adjustment of device Applied at drs request.  Karolee Stamps 07/20/2018, 9:48 AM

## 2018-07-20 NOTE — Evaluation (Addendum)
Physical Therapy Evaluation Patient Details Name: Karen Jackson MRN: [id number] DOB: 1941-08-26 Today's Date: 07/20/2018   History of Present Illness  Pt admit for bimalleolar ankle fracture left.  Pt underwent ORIF left ankle 11/23.  PMH:  CHF, Afib, right CVA, right TKA, mastectomy.   Clinical Impression  Pt admitted with above diagnosis. Pt currently with functional limitations due to the deficits listed below (see PT Problem List). Pt was able to sit EOB for 15 minutes and perform exercises.  Pt tolerated well.  Pt plans to return to [facility] for therapy and recovery prior to d/c home with family and caregiver assisst.  Pt should progress well and will initially be wheelchair level until weight bearing status increases.  Pt aware.  Pt will benefit from skilled PT to increase their independence and safety with mobility to allow discharge to the venue listed below.      Follow Up Recommendations SNF;Supervision/Assistance - 24 hour    Equipment Recommendations  Hospital bed    Recommendations for Other Services       Precautions / Restrictions Precautions Precautions: Fall Required Braces or Orthoses: Other Brace/Splint Other Brace/Splint: CAM walker Restrictions Weight Bearing Restrictions: Yes LLE Weight Bearing: Non weight bearing      Mobility  Bed Mobility Overal bed mobility: Needs Assistance Bed Mobility: Supine to Sit;Sit to Supine     Supine to sit: Min assist Sit to supine: Min assist   General bed mobility comments: Assisted wtih elevation of trunk and left LE to come to EOB and to get back in bed.   Transfers                 General transfer comment: Pt declined OOB since she had surgery this am  Ambulation/Gait                Stairs            Wheelchair Mobility    Modified Rankin (Stroke Patients Only)       Balance Overall balance assessment: Needs assistance Sitting-balance support: No upper extremity supported;Feet  supported Sitting balance-Leahy Scale: Fair Sitting balance - Comments: Pt sats EOB with no UE support for 15 min while performing exercises.                                       Pertinent Vitals/Pain Pain Assessment: No/denies pain    Home Living Family/patient expects to be discharged to:: Private residence Living Arrangements: Alone Available Help at Discharge: Family;Personal care attendant;Available 24 hours/day Type of Home: House Home Access: Ramped entrance     Home Layout: One level Home Equipment: Walker - 2 wheels;Shower seat;Wheelchair - manual;Bedside commode;Grab bars - tub/shower(2LO2, sliding board)      Prior Function Level of Independence: Needs assistance   Gait / Transfers Assistance Needed: Was in bed at SNF since Tues due to waiting for surgery  ADL's / Homemaking Assistance Needed: HAs required assist for bathing and dressing for years.   Comments: Prior to ankle fracture, pt was ambulating indoors with RW and wheelchair outdoors.     Hand Dominance   Dominant Hand: Right    Extremity/Trunk Assessment   Upper Extremity Assessment Upper Extremity Assessment: Defer to OT evaluation    Lower Extremity Assessment Lower Extremity Assessment: RLE deficits/detail;LLE deficits/detail RLE Deficits / Details: grossly 3+/5 LLE Deficits / Details: hip 2+/5, knee 3/5, ankle NT due  to surgery/immmobilized    Cervical / Trunk Assessment Cervical / Trunk Assessment: Normal  Communication   Communication: No difficulties  Cognition Arousal/Alertness: Awake/alert Behavior During Therapy: WFL for tasks assessed/performed Overall Cognitive Status: Within Functional Limits for tasks assessed                                        General Comments      Exercises General Exercises - Lower Extremity Ankle Circles/Pumps: AROM;Right;10 reps;Seated Long Arc Quad: AROM;Both;10 reps;Seated Hip ABduction/ADduction: AROM;Right;10  reps;Seated Hip Flexion/Marching: AROM;Both;AAROM;10 reps;Seated   Assessment/Plan    PT Assessment Patient needs continued PT services  PT Problem List Decreased activity tolerance;Decreased balance;Decreased mobility;Decreased range of motion;Decreased strength;Decreased knowledge of use of DME;Decreased safety awareness;Decreased knowledge of precautions       PT Treatment Interventions DME instruction;Gait training;Functional mobility training;Therapeutic activities;Therapeutic exercise;Balance training;Patient/family education;Wheelchair mobility training    PT Goals (Current goals can be found in the Care Plan section)  Acute Rehab PT Goals Patient Stated Goal: to go to [facility] for therapy PT Goal Formulation: With patient Time For Goal Achievement: 08/03/18 Potential to Achieve Goals: Good    Frequency Min 6X/week   Barriers to discharge        Co-evaluation               AM-PAC PT "6 Clicks" Mobility  Outcome Measure Help needed turning from your back to your side while in a flat bed without using bedrails?: A Little Help needed moving from lying on your back to sitting on the side of a flat bed without using bedrails?: A Little Help needed moving to and from a bed to a chair (including a wheelchair)?: A Lot Help needed standing up from a chair using your arms (e.g., wheelchair or bedside chair)?: Total Help needed to walk in hospital room?: Total Help needed climbing 3-5 steps with a railing? : Total 6 Click Score: 11    End of Session Equipment Utilized During Treatment: Gait belt;Oxygen(2LO2) Activity Tolerance: Patient limited by fatigue Patient left: in bed;with call bell/phone within reach;with family/visitor present Nurse Communication: Mobility status PT Visit Diagnosis: Muscle weakness (generalized) (M62.81);Unsteadiness on feet (R26.81);History of falling (Z91.81)    Time: 1443-1510 PT Time Calculation (min) (ACUTE ONLY): 27 min   Charges:    PT Evaluation $PT Eval Moderate Complexity: 1 Mod PT Treatments $Therapeutic Activity: 8-22 mins         ,PT Acute Rehabilitation Services Pager:  [phone]  Office:  [place] 07/20/2018, 3:36 PM

## 2018-07-20 NOTE — Op Note (Signed)
07/20/2018  9:07 AM  PATIENT:  Karen Jackson  77 y.o. female  PRE-OPERATIVE DIAGNOSIS:  left displaced bimalleolar fracture  POST-OPERATIVE DIAGNOSIS: 1.  left displaced bimalleolar fracture      2.  Left deltoid ligament rupture  Procedure(s): 1.  Open treatment of left ankle bimalleolar fracture with internal fixation 2.  Repair of left ankle deltoid ligament through separate incision 3.  Stress examination of the left ankle under fluoroscopy 4.  AP, lateral and mortise radiographs of the left ankle  SURGEON:  Wylene Simmer, MD  ASSISTANT: None  ANESTHESIA:   General, regional  EBL:  minimal   TOURNIQUET:   Total Tourniquet Time Documented: Thigh (Left) - 52 minutes Total: Thigh (Left) - 52 minutes  COMPLICATIONS:  None apparent  DISPOSITION:  Extubated, awake and stable to recovery.  INDICATION FOR PROCEDURE: The patient is a 77 year old female with past medical history significant for diabetes.  She fell at home approximately a week ago injuring her left ankle.  She was found in the emergency department to have a bimalleolar fracture dislocation.  She was reduced and splinted.  She presents now for operative treatment of this displaced and unstable injury.  The risks and benefits of the alternative treatment options have been discussed in detail.  The patient wishes to proceed with surgery and specifically understands risks of bleeding, infection, nerve damage, blood clots, need for additional surgery, amputation and death.  PROCEDURE IN DETAIL:  After pre operative consent was obtained, and the correct operative site was identified, the patient was brought to the operating room and placed supine on the OR table.  Anesthesia was administered.  Pre-operative antibiotics were administered.  A surgical timeout was taken.  The left lower extremity was prepped and draped in standard sterile fashion with a tourniquet around the thigh.  The extremity was exsanguinated and the tourniquet  was inflated to 350 mmHg.  A longitudinal incision was made over the lateral malleolus.  Dissection was carried down to the fracture site.  The fracture was cleaned of all hematoma and irrigated.  The fracture was reduced and held with a tenaculum.  A 3.5 mm fully threaded lag screw was inserted from posterior to anterior across the fracture site.  A 7 hole [employer]immer [employer] composite plate was then contoured to fit the lateral malleolus.  It was secured proximally with 3 bicortical screws and distally with 3 unicortical locking screws.  Stress examination was then performed.  The ankle syndesmosis was stable but talar tilt testing revealed complete disruption of the deep and superficial deltoid ligament.  The decision was made at that point to proceed with deltoid ligament repair.  Attention was turned to the medial aspect of the ankle where a longitudinal incision was made.  Dissection was carried down through the subcutaneous tissues.  The medial malleolus fracture was identified.  This was a tiny fragment of bone that was too small to repair.  This fragment was loose and not attached to any ligament.  It was removed.  The deltoid ligament was noted to be completely avulsed from the medial malleolus.  The medial malleolus was roughened with a curette.  A Biomet juggernaut anchor was inserted after drilling.  It was noted to have appropriate purchase.  The suture was advanced through the deep deltoid ligament distally.  The ligament was pulled up onto the medial malleolus and suture tied.  The suture ends were then advanced through the superficial deltoid securing them proximally as well.  The  anterior joint capsule and retinaculum over the posterior tibial tendon were repaired with 0 Vicryl figure-of-eight sutures.  The posterior tib was noted to be healthy with no evidence of tear.  Final AP, lateral and mortise radiographs showed appropriate alignment of the hardware and appropriate reduction of the  ankle joint.  The medial and lateral wounds were irrigated copiously.  Vancomycin powder was speckled in both wounds.  The sub-cutaneous tissues were approximated with Vicryl and Monocryl.  The skin incisions were closed with nylon.  Sterile dressings were applied followed by a well-padded short leg splint.  The tourniquet was released after application of the dressings.  The patient was awakened from anesthesia and transported to the recovery room in stable condition.   FOLLOW UP PLAN: The patient will be admitted for physical therapy, occupational therapy and likely placement in a rehab facility.  Lovenox for DVT prophylaxis.  RADIOGRAPHS: AP, mortise and lateral radiographs of the left ankle are obtained intraoperatively.  These show interval reduction and fixation of the lateral malleolus fracture with appropriate alignment of the ankle joint.  No other acute injuries are noted.

## 2018-07-20 NOTE — Anesthesia Preprocedure Evaluation (Signed)
Anesthesia Evaluation  Patient identified by MRN, date of birth, ID band Patient awake    Reviewed: Allergy & Precautions, NPO status , Patient's Chart, lab work & pertinent test results, reviewed documented beta blocker date and time   Airway Mallampati: II  TM Distance: >3 FB Neck ROM: Full    Dental  (+) Teeth Intact, Dental Advisory Given   Pulmonary former smoker,    Pulmonary exam normal breath sounds clear to auscultation       Cardiovascular hypertension, Pt. on home beta blockers and Pt. on medications +CHF  + dysrhythmias Atrial Fibrillation  Rhythm:Irregular Rate:Normal  Echo 03/21/17: Study Conclusions  - Left ventricle: The cavity size was normal. Systolic function was normal. The estimated ejection fraction was in the range of 60% to 65%. Wall motion was normal; there were no regional wall motion abnormalities. There was a reduced contribution of atrial contraction to ventricular filling, due to increased ventricular diastolic pressure or atrial contractile dysfunction. Doppler   parameters are consistent with a reversible restrictive pattern, indicative of decreased left ventricular diastolic compliance and/or increased left atrial pressure (grade 3 diastolic dysfunction). Doppler parameters are consistent with high ventricular filling pressure. - Aortic valve: Poorly visualized. - Mitral valve: Calcified annulus. There was mild regurgitation. - Left atrium: The atrium was mildly to moderately dilated.   Anterior-posterior dimension: 48 mm. - Right atrium: The atrium was mildly dilated. - Pulmonic valve: Poorly visualized. - Pulmonary arteries: Systolic pressure could not be accurately estimated.   Neuro/Psych CVA    GI/Hepatic Neg liver ROS, GERD  Medicated,  Endo/Other  diabetes, Type 2Obesity   Renal/GU negative Renal ROS     Musculoskeletal  (+) Arthritis , left displaced bimalleolar fracture   Abdominal   Peds  Hematology negative hematology ROS (+)   Anesthesia Other Findings Day of surgery medications reviewed with the patient.  Reproductive/Obstetrics                             Anesthesia Physical Anesthesia Plan  ASA: III  Anesthesia Plan: General   Post-op Pain Management:  Regional for Post-op pain   Induction: Intravenous  PONV Risk Score and Plan: 3 and Ondansetron and Dexamethasone  Airway Management Planned: LMA  Additional Equipment:   Intra-op Plan:   Post-operative Plan: Extubation in OR  Informed Consent: I have reviewed the patients History and Physical, chart, labs and discussed the procedure including the risks, benefits and alternatives for the proposed anesthesia with the patient or authorized representative who has indicated his/her understanding and acceptance.   Dental advisory given  Plan Discussed with: CRNA  Anesthesia Plan Comments:         Anesthesia Quick Evaluation

## 2018-07-20 NOTE — Discharge Instructions (Addendum)
Karen Simmer, MD [place]  Please read the following information regarding your care after surgery.  Medications  You only need a prescription for the narcotic pain medicine (ex. oxycodone, Percocet, Norco).  All of the other medicines listed below are available over the counter. X Aleve 2 pills twice a day for the first 3 days after surgery. X acetominophen (Tylenol) 650 mg every 4-6 hours as you need for minor to moderate pain X oxycodone as prescribed for severe pain X zofran as prescribed for nausea  Narcotic pain medicine (ex. oxycodone, Percocet, Vicodin) will cause constipation.  To prevent this problem, take the following medicines while you are taking any pain medicine. X docusate sodium (Colace) 100 mg twice a day X senna (Senokot) 2 tablets twice a day  X To help prevent blood clots, take a baby aspirin (81 mg) twice a day after surgery.  You should also get up every hour while you are awake to move around.    Weight Bearing X Do not bear any weight on the operated leg or foot.  Cast / Splint / Dressing X Keep your splint, cast or dressing clean and dry.  Dont put anything (coat hanger, pencil, etc) down inside of it.  If it gets damp, use a hair dryer on the cool setting to dry it.  If it gets soaked, call the office to schedule an appointment for a cast change.   After your dressing, cast or splint is removed; you may shower, but do not soak or scrub the wound.  Allow the water to run over it, and then gently pat it dry.  Swelling It is normal for you to have swelling where you had surgery.  To reduce swelling and pain, keep your toes above your nose for at least 3 days after surgery.  It may be necessary to keep your foot or leg elevated for several weeks.  If it hurts, it should be elevated.  Follow Up Call my office at [phone] when you are discharged from the hospital or surgery center to schedule an appointment to be seen two weeks after  surgery.  Call my office at [phone] if you develop a fever >101.5 F, nausea, vomiting, bleeding from the surgical site or severe pain.

## 2018-07-21 NOTE — NC FL2 (Signed)
[place] LEVEL OF CARE SCREENING TOOL     IDENTIFICATION  Patient Name: Karen Jackson Birthdate: 1941-04-21 Sex: female Admission Date (Current Location): 07/20/2018  [facility] [place] Number:  Herbalist and Address:  [facility], [place] [address] [place], [place] [address]      Provider Number: [id number]  Attending Physician Name and Address:  Wylene Simmer, MD  Relative Name and Phone Number:  Pattie, [place], [phone]    Current Level of Care: Hospital Recommended Level of Care: [place] Prior Approval Number:    Date Approved/Denied:   PASRR Number: [id number]  Discharge Plan: SNF    Current Diagnoses: Patient Active Problem List   Diagnosis Date Noted  . Closed left ankle fracture 07/20/2018  . Acute ankle pain 05/23/2018  . Respiratory failure with hypoxia ([place]) 08/30/2017  . Hypoxemia   . Heart failure with preserved ejection fraction ([place]), Grade 3 diastolic dysfunction 83/29/1916  . Atrial fibrillation with RVR ([place])   . PAF (paroxysmal atrial fibrillation) ([place])   . Metabolic syndrome 60/60/0459  . Encounter for preventive health examination 02/17/2016  . Morbid obesity ([place]) 06/17/2015  . Hypomagnesemia 04/24/2014  . Hemiparesis affecting left side as late effect of cerebrovascular accident ([place]) 04/24/2014  . Nontraumatic cerebral hemorrhage ([place]) 04/30/2012  . DM (diabetes mellitus) with complications ([place]) 97/74/1423  . OSTEOPENIA 01/21/2009  . UNSPECIFIED VITAMIN D DEFICIENCY 11/19/2007  . ESSENTIAL HYPERTENSION, BENIGN 11/19/2007  . HYPERCHOLESTEROLEMIA 10/25/2006  . GASTROESOPHAGEAL REFLUX, NO ESOPHAGITIS 10/25/2006  . DIVERTICULOSIS OF COLON 10/25/2006  . Osteoarthritis 10/25/2006  . CERVICAL SPINE DISORDER, NOS 10/25/2006    Orientation RESPIRATION BLADDER Height & Weight     Self, Time, Situation, Place  Normal Continent Weight: 172 lb 6.4 oz (78.2 kg) Height:  4'  10" (147.3 cm)  BEHAVIORAL SYMPTOMS/MOOD NEUROLOGICAL BOWEL NUTRITION STATUS      Continent Diet(Carb modified)  AMBULATORY STATUS COMMUNICATION OF NEEDS Skin   Independent Verbally Surgical wounds(Incision left ankle, compression wrap, needs dressing change)                       Personal Care Assistance Level of Assistance  Bathing, Dressing Bathing Assistance: Limited assistance   Dressing Assistance: Limited assistance     Functional Limitations Info  Sight, Speech, Hearing Sight Info: Adequate Hearing Info: Adequate Speech Info: Adequate    SPECIAL CARE FACTORS FREQUENCY  PT (By licensed PT), OT (By licensed OT)     PT Frequency: 5x/wk OT Frequency: 5x/wk            Contractures Contractures Info: Not present    Additional Factors Info  Allergies, Code Status Code Status Info: Full Code Allergies Info: Codeine Phosphate, Simvastatin           Current Medications (07/21/2018):  This is the current hospital active medication list Current Facility-Administered Medications  Medication Dose Route Frequency Provider Last Rate Last Dose  . 0.9 %  sodium chloride infusion   Intravenous Continuous Corky Sing, PA-C   Stopped at 07/20/18 1125  . acetaminophen (TYLENOL) tablet 325-650 mg  325-650 mg Oral Q6H PRN Corky Sing, PA-C      . atorvastatin (LIPITOR) tablet 20 mg  20 mg Oral q1800 Corky Sing, [place]   20 mg at 07/20/18 1634  . calcium-vitamin D (OSCAL WITH D) 500-200 MG-UNIT per tablet 1 tablet  1 tablet Oral Daily Corky Sing, [place]  1 tablet at 07/21/18 0832  . diltiazem (CARDIZEM CD) 24 hr capsule 240 mg  240 mg Oral Daily Corky Sing, PA-C   240 mg at 07/21/18 0831  . diphenhydrAMINE (BENADRYL) 12.5 MG/5ML elixir 12.5-25 mg  12.5-25 mg Oral Q4H PRN Corky Sing, PA-C      . docusate sodium (COLACE) capsule 100 mg  100 mg Oral BID Corky Sing, PA-C   100 mg at 07/21/18 0831  . enoxaparin (LOVENOX) injection  40 mg  40 mg Subcutaneous Q24H Corky Sing, PA-C   40 mg at 07/21/18 0831  . folic acid (FOLVITE) tablet 1 mg  1 mg Oral Daily Corky Sing, PA-C   1 mg at 07/21/18 7253  . furosemide (LASIX) tablet 80 mg  80 mg Oral Daily Corky Sing, PA-C   80 mg at 07/21/18 6644  . gabapentin (NEURONTIN) capsule 100 mg  100 mg Oral TID Corky Sing, PA-C   100 mg at 07/21/18 0347  . HYDROcodone-acetaminophen (NORCO) 7.5-325 MG per tablet 1-2 tablet  1-2 tablet Oral Q4H PRN Corky Sing, PA-C      . HYDROcodone-acetaminophen (NORCO/VICODIN) 5-325 MG per tablet 1-2 tablet  1-2 tablet Oral Q4H PRN Corky Sing, PA-C   1 tablet at 07/21/18 0831  . LORazepam (ATIVAN) tablet 1 mg  1 mg Oral Q6H PRN Corky Sing, PA-C       Or  . LORazepam (ATIVAN) injection 1 mg  1 mg Intravenous Q6H PRN Corky Sing, PA-C      . magnesium oxide (MAG-OX) tablet 400 mg  400 mg Oral Daily Corky Sing, PA-C   400 mg at 07/21/18 4259  . methocarbamol (ROBAXIN) tablet 500 mg  500 mg Oral Q6H PRN Corky Sing, PA-C       Or  . methocarbamol (ROBAXIN) 500 mg in dextrose 5 % 50 mL IVPB  500 mg Intravenous Q6H PRN Corky Sing, PA-C      . metoprolol tartrate (LOPRESSOR) tablet 50 mg  50 mg Oral BID Corky Sing, PA-C   50 mg at 07/21/18 5638  . morphine 2 MG/ML injection 0.5-1 mg  0.5-1 mg Intravenous Q2H PRN Corky Sing, PA-C      . multivitamin with minerals tablet 1 tablet  1 tablet Oral Daily Corky Sing, [place]   1 tablet at 07/21/18 7564  . ondansetron (ZOFRAN) tablet 4 mg  4 mg Oral Q6H PRN Corky Sing, PA-C       Or  . ondansetron [facility]) injection 4 mg  4 mg Intravenous Q6H PRN Corky Sing, PA-C      . pantoprazole (PROTONIX) EC tablet 40 mg  40 mg Oral Daily Corky Sing, PA-C   40 mg at 07/21/18 3329  . senna (SENOKOT) tablet 8.6 mg  1 tablet Oral BID Corky Sing, PA-C   8.6 mg at 07/21/18 5188  . thiamine (VITAMIN  B-1) tablet 100 mg  100 mg Oral Daily Corky Sing, PA-C   100 mg at 07/21/18 4166   Or  . thiamine (B-1) injection 100 mg  100 mg Intravenous Daily Corky Sing, PA-C      . traZODone (DESYREL) tablet 50 mg  50 mg Oral QHS Corky Sing, [place]         Discharge Medications: Please see discharge summary for a list of discharge medications.  Relevant Imaging Results:  Relevant Lab Results:   Additional  Information SSN: [id number]  Philippa Chester , LCSWA

## 2018-07-21 NOTE — Progress Notes (Signed)
Subjective: 1 Day Post-Op Procedure(s) (LRB): OPEN REDUCTION INTERNAL FIXATION (ORIF) ANKLE FRACTURE (Left) Patient reports pain as mild.   Reports essentially no pain currently, block still in effect, starting to feel some tingling in her toes. No other c/o. Denies CP, SOB, fever, chills, N/V  Objective: Vital signs in last 24 hours: Temp:  [97 F (36.1 C)-98.4 F (36.9 C)] 98.4 F (36.9 C) (11/24 0532) Pulse Rate:  [68-99] 84 (11/24 0532) Resp:  [14-18] 16 (11/23 2005) BP: (107-144)/(46-80) 144/73 (11/24 0532) SpO2:  [91 %-98 %] 91 % (11/24 0532)  Intake/Output from previous day: 11/23 0701 - 11/24 0700 In: 500 [I.V.:500] Out: -  Intake/Output this shift: No intake/output data recorded.  Recent Labs    07/20/18 1016  HGB 14.1   Recent Labs    07/20/18 1016  WBC 9.1  RBC 4.42  HCT 43.7  PLT 362   Recent Labs    07/20/18 0727 07/20/18 1016  NA 138  --   K 3.5  --   CL 99  --   CO2 28  --   BUN 19  --   CREATININE 0.75 0.58  GLUCOSE 154*  --   CALCIUM 9.3  --    No results for input(s): LABPT, INR in the last 72 hours.  Neurologically intact ABD soft Neurovascular intact Sensation intact distally Intact pulses distally Dorsiflexion/Plantar flexion intact Incision: dressing C/D/I and no drainage No cellulitis present Compartment soft no sign of DVT  Assessment/Plan: 1 Day Post-Op Procedure(s) (LRB): OPEN REDUCTION INTERNAL FIXATION (ORIF) ANKLE FRACTURE (Left) Advance diet Up with therapy D/C IV fluids Will require placement back at rehab facility NWB LLE Lovenox for DVT ppx  ,  M. 07/21/2018, 8:08 AM

## 2018-07-21 NOTE — Evaluation (Signed)
Occupational Therapy Evaluation Patient Details Name: Karen Jackson MRN: [id number] DOB: 11-27-1940 Today's Date: 07/21/2018    History of Present Illness Pt admit for bimalleolar ankle fracture left.  Pt underwent ORIF left ankle 11/23.  PMH:  CHF, Afib, right CVA, right TKA, mastectomy.    Clinical Impression   PATIENT WAS SEEN FOR SKILLED OT TO MAXIMIZE I AND SAFETY WITH ADLS, MOBILITY, AND IADLS. PATIENT WAS HAVING ASSIST WITH ADLS PRIOR TO ANKLE FX SECODARY TO CVA BUT IS REQUIRING ADDITIONAL CARE. PATIENT DOES WANT TO GO TO [facility] FOR REHAB. ALL FURTHER OT NEEDS CAN BE ADDRESSED AT D/C LOCATION.    Follow Up Recommendations  SNF    Equipment Recommendations  None recommended by OT    Recommendations for Other Services       Precautions / Restrictions Precautions Precautions: Fall Required Braces or Orthoses: Other Brace/Splint Other Brace/Splint: CAM walker Restrictions Weight Bearing Restrictions: Yes LLE Weight Bearing: Non weight bearing      Mobility Bed Mobility Overal bed mobility: Needs Assistance       Supine to sit: Min assist Sit to supine: Min assist   General bed mobility comments: ASSISTED WITH MANAGEMENT OF L LE.   Transfers Overall transfer level: Needs assistance               General transfer comment: PATIENT WAS MOD A WTIH SIT TO STAND FROM BED BUT WAS NOT ABLE TO PERFORM PIVOT TRANSFER AND MAINTAIN NWB.    Balance                                           ADL either performed or assessed with clinical judgement   ADL Overall ADL's : Needs assistance/impaired Eating/Feeding: Set up   Grooming: Wash/dry hands;Wash/dry face;Supervision/safety;Set up;Sitting   Upper Body Bathing: Set up;Minimal assistance;Sitting   Lower Body Bathing: Maximal assistance   Upper Body Dressing : Minimal assistance   Lower Body Dressing: Maximal assistance;Sit to/from stand               Functional mobility during  ADLs: (ATTEMPTED TO PERFORM TRANSFER TO COMMODE BUT PNT WAS NOT ) General ADL Comments: PNT REQUIRED ASSIST WITH ADLS PRIOR TO ANKLE FX SECONDARY TO CVA 6 YEARS AGOL     Vision Baseline Vision/History: No visual deficits Patient Visual Report: No change from baseline       Perception     Praxis      Pertinent Vitals/Pain Pain Assessment: No/denies pain     Hand Dominance Right   Extremity/Trunk Assessment Upper Extremity Assessment Upper Extremity Assessment: LUE deficits/detail LUE Deficits / Details: previous cva, R shld arom 50 degrees and aarom is 90 degrees. patient dista ROM is wnl with extra time required.  LUE Coordination: (patient has decreased coordination secondary to cva but is a)           Communication Communication Communication: No difficulties   Cognition Arousal/Alertness: Awake/alert Behavior During Therapy: WFL for tasks assessed/performed Overall Cognitive Status: Within Functional Limits for tasks assessed                                     General Comments       Exercises     Shoulder Instructions      Home Living Family/patient expects to be  discharged to:: Private residence Living Arrangements: Alone Available Help at Discharge: Family;Personal care attendant;Available 24 hours/day Type of Home: House Home Access: Ramped entrance     Home Layout: One level     Bathroom Shower/Tub: Occupational psychologist: Handicapped height     Home Equipment: Environmental consultant - 2 wheels;Shower seat;Wheelchair - manual;Bedside commode;Grab bars - tub/shower(2LO2, sliding board)          Prior Functioning/Environment Level of Independence: Needs assistance  Gait / Transfers Assistance Needed: Was in bed at SNF since Tues due to waiting for surgery ADL's / Homemaking Assistance Needed: HAs required assist for bathing and dressing for years.    Comments: Prior to ankle fracture, pt was ambulating indoors with RW and  wheelchair outdoors.        OT Problem List:        OT Treatment/Interventions:      OT Goals(Current goals can be found in the care plan section) Acute Rehab OT Goals Patient Stated Goal: TO GO BACK TO [facility] OT Goal Formulation: With patient  OT Frequency:     Barriers to D/C:            Co-evaluation              AM-PAC OT "6 Clicks" Daily Activity     Outcome Measure Help from another person eating meals?: A Little Help from another person taking care of personal grooming?: A Little Help from another person toileting, which includes using toliet, bedpan, or urinal?: Total Help from another person bathing (including washing, rinsing, drying)?: A Lot Help from another person to put on and taking off regular upper body clothing?: A Little Help from another person to put on and taking off regular lower body clothing?: A Lot 6 Click Score: 14   End of Session Nurse Communication: (OK THERAPY)  Activity Tolerance: Patient tolerated treatment well Patient left: in bed;with call bell/phone within reach;with bed alarm set                   Time: 0712-1975 OT Time Calculation (min): 30 min Charges:  OT General Charges $OT Visit: 1 Visit OT Evaluation $OT Eval Low Complexity: 1 Low OT Treatments $Self Care/Home Management : 8-83 mins  6 CLICKS , 07/21/2018, 9:46 AM

## 2018-07-22 ENCOUNTER — Other Ambulatory Visit: Payer: Self-pay

## 2018-07-22 MED ORDER — ONDANSETRON HCL 4 MG PO TABS: 4.0000 mg | ORAL_TABLET | Freq: Every day | ORAL | 0 refills | Status: DC | PRN

## 2018-07-22 MED ORDER — OXYCODONE HCL 5 MG PO TABS: 5.0000 mg | ORAL_TABLET | ORAL | 0 refills | Status: AC | PRN

## 2018-07-22 MED ORDER — ASPIRIN EC 81 MG PO TBEC: 81.0000 mg | DELAYED_RELEASE_TABLET | Freq: Two times a day (BID) | ORAL | 0 refills | Status: DC

## 2018-07-22 MED ORDER — SENNA 8.6 MG PO TABS: 2.0000 | ORAL_TABLET | Freq: Two times a day (BID) | ORAL | 0 refills | Status: DC

## 2018-07-22 MED ORDER — DOCUSATE SODIUM 100 MG PO CAPS: 100.0000 mg | ORAL_CAPSULE | Freq: Two times a day (BID) | ORAL | 0 refills | Status: DC

## 2018-07-22 NOTE — Progress Notes (Signed)
Subjective: 2 Days Post-Op Procedure(s) (LRB): OPEN REDUCTION INTERNAL FIXATION (ORIF) ANKLE FRACTURE (Left)  Patient reports pain as mild to moderate.  Reports that block is starting to wear off, but that pain is tolerable.  Tolerating POs well.  Admits to flatus.  Denies fever, chills, N/V, CP, SOB.  Objective:   VITALS:  Temp:  [98.3 F (36.8 C)-98.7 F (37.1 C)] 98.3 F (36.8 C) (11/25 0550) Pulse Rate:  [63-70] 67 (11/25 0550) Resp:  [18-20] 20 (11/25 0550) BP: (103-135)/(51-59) 135/59 (11/25 0550) SpO2:  [92 %-95 %] 95 % (11/25 0550)  General: WDWN patient in NAD. Psych:  Appropriate mood and affect. Neuro:  A&O x 3, Moving all extremities, sensation intact to light touch HEENT:  EOMs intact Chest:  Even non-labored respirations Skin: Dressing/CAM boot C/D/I, no rashes or lesions Extremities: warm/dry, mild edema, no erythema or echymosis.  No lymphadenopathy. Pulses: Popliteus 2+ MSK:  ROM: EHL/FHL intact, MMT: able to perform quad set,    LABS Recent Labs    07/20/18 1016  HGB 14.1  WBC 9.1  PLT 362   Recent Labs    07/20/18 0727 07/20/18 1016  NA 138  --   K 3.5  --   CL 99  --   CO2 28  --   BUN 19  --   CREATININE 0.75 0.58  GLUCOSE 154*  --    No results for input(s): LABPT, INR in the last 72 hours.   Assessment/Plan: 2 Days Post-Op Procedure(s) (LRB): OPEN REDUCTION INTERNAL FIXATION (ORIF) ANKLE FRACTURE (Left)  NWB L LE Up with therapy D/C to SNF when bed available Scripts on chart Plan on 2 week outpatient post-op visit with Dr. Milus Height PA-C [facility] Office:  [phone]

## 2018-07-22 NOTE — Plan of Care (Signed)

## 2018-07-22 NOTE — Clinical Social Work Note (Signed)
Clinical Social Work Assessment  Patient Details  Name: Karen Jackson MRN: [id number] Date of Birth: April 23, 1941  Date of referral:  07/22/18               Reason for consult:  Discharge Planning                Permission sought to share information with:  Case Manager, Facility Sport and exercise psychologist, Family Supports Permission granted to share information::  Yes, Verbal Permission Granted  Name::     [place]::  SNFs  Relationship::  son  Contact Information:  [phone]  Housing/Transportation Living arrangements for the past 2 months:  [place] of Information:  Patient Patient Interpreter Needed:  None Criminal Activity/Legal Involvement Pertinent to Current Situation/Hospitalization:  No - Comment as needed Significant Relationships:  Adult Children Lives with:  Facility Resident Do you feel safe going back to the place where you live?  Yes Need for family participation in patient care:  Yes (Comment)  Care giving concerns:  CSW received referral for possible SNF placement at time of discharge. Spoke with patient regarding possibility of SNF placement . Patient's family is currently unable to care for her at their home given patient's current needs and fall risk.  Patient and son Karen Jackson expressed understanding of PT recommendation and are agreeable to SNF placement at time of discharge and would like to return to [facility] where patient has been staying. CSW to continue to follow and assist with discharge planning needs.     Social Worker assessment / plan:  Spoke with patient and  Son Karen Jackson concerning possibility of rehab at SNF before returning home.    Employment status:  Retired Forensic scientist:  Medicare PT Recommendations:  [place] / Referral to community resources:  [place]  Patient/Family's Response to care:  Patient's son Karen Jackson recognizes need for rehab before returning home  and are agreeable to a SNF in [place]. They report preference for returning to [place]    . CSW explained insurance authorization process. Patient's family reported that they want patient to get stronger to be able to come back home.    Patient/Family's Understanding of and Emotional Response to Diagnosis, Current Treatment, and Prognosis:  Patient/family is realistic regarding therapy needs and expressed being hopeful for SNF placement and to return to [place] soon. Patient expressed understanding of CSW role and discharge process as well as medical condition. No questions/concerns about plan or treatment.    Emotional Assessment Appearance:  Appears stated age Attitude/Demeanor/Rapport:  Unable to Assess Affect (typically observed):  Unable to Assess Orientation:  Oriented to Self, Oriented to Place, Oriented to  Time, Oriented to Situation Alcohol / Substance use:  Not Applicable Psych involvement (Current and /or in the community):  No (Comment)  Discharge Needs  Concerns to be addressed:  Discharge Planning Concerns Readmission within the last 30 days:  No Current discharge risk:  Dependent with Mobility Barriers to Discharge:  Continued Medical Work up   [employer], LCSW 07/22/2018, 1:27 PM

## 2018-07-22 NOTE — Progress Notes (Addendum)
Physical Therapy Treatment Patient Details Name: Karen Jackson MRN: [id number] DOB: [birth year] Today's Date: 07/22/2018    History of Present Illness Pt admit for bimalleolar ankle fracture left.  Pt underwent ORIF left ankle 11/23.  PMH:  CHF, Afib, right CVA, right TKA, mastectomy.     PT Comments    Patient seen for mobility progression. Pt requires A +2 for functional transfer training. Pt unable to maintain NWB status without assistance this session.  Plan to work on transfers using slide board next session. Continue to progress as tolerated with anticipated d/c to SNF for further skilled PT services.    Follow Up Recommendations  SNF;Supervision/Assistance - 24 hour     Equipment Recommendations  Hospital bed    Recommendations for Other Services       Precautions / Restrictions Precautions Precautions: Fall Required Braces or Orthoses: Other Brace/Splint Other Brace/Splint: CAM walker Restrictions Weight Bearing Restrictions: Yes LLE Weight Bearing: Non weight bearing    Mobility  Bed Mobility Overal bed mobility: Needs Assistance Bed Mobility: Supine to Sit;Sit to Supine     Supine to sit: Min assist Sit to supine: Mod assist   General bed mobility comments: assist to bring L LE to EOB and then to bring bilat LE into bed; cues for sequencing; use of rail  Transfers Overall transfer level: Needs assistance Equipment used: Rolling walker (2 wheeled);2 person hand held assist Transfers: Sit to/from Stand Sit to Stand: Max assist;+2 physical assistance;From elevated surface         General transfer comment: attempted sit to stand transfers X2 from elevated bed height; cues for safe hand placement and L LE positioning; assist to maintain NWB status and to power up into standing; pt able to partially stand intial trial but unable to progress due to inability to maintain weight bearing status  Ambulation/Gait                 Stairs              Wheelchair Mobility    Modified Rankin (Stroke Patients Only)       Balance Overall balance assessment: Needs assistance Sitting-balance support: No upper extremity supported;Feet supported Sitting balance-Leahy Scale: Fair                                      Cognition Arousal/Alertness: Awake/alert Behavior During Therapy: WFL for tasks assessed/performed Overall Cognitive Status: Within Functional Limits for tasks assessed                                        Exercises      General Comments        Pertinent Vitals/Pain      Home Living                      Prior Function            PT Goals (current goals can now be found in the care plan section) Acute Rehab PT Goals Patient Stated Goal: TO GO BACK TO [place] Progress towards PT goals: Progressing toward goals    Frequency    Min 6X/week      PT Plan Current plan remains appropriate    Co-evaluation  AM-PAC PT "6 Clicks" Mobility   Outcome Measure  Help needed turning from your back to your side while in a flat bed without using bedrails?: A Little Help needed moving from lying on your back to sitting on the side of a flat bed without using bedrails?: A Little Help needed moving to and from a bed to a chair (including a wheelchair)?: A Lot Help needed standing up from a chair using your arms (e.g., wheelchair or bedside chair)?: Total Help needed to walk in hospital room?: Total Help needed climbing 3-5 steps with a railing? : Total 6 Click Score: 11    End of Session Equipment Utilized During Treatment: Gait belt Activity Tolerance: Patient tolerated treatment well Patient left: in bed;with call bell/phone within reach Nurse Communication: Mobility status PT Visit Diagnosis: Muscle weakness (generalized) (M62.81);Unsteadiness on feet (R26.81);History of falling (Z91.81)     Time: 6333-5456 PT Time Calculation (min) (ACUTE  ONLY): 25 min  Charges:  $Therapeutic Activity: 23-37 mins                     Earney Navy, PTA Acute Rehabilitation Services Pager: [phone] Office: [phone]     Darliss Cheney 07/22/2018, 4:55 PM

## 2018-07-23 ENCOUNTER — Ambulatory Visit: Payer: Medicare Other | Admitting: Physical Therapy

## 2018-07-23 ENCOUNTER — Encounter (HOSPITAL_COMMUNITY): Payer: Self-pay | Admitting: Orthopedic Surgery

## 2018-07-23 ENCOUNTER — Other Ambulatory Visit: Payer: Medicare Other

## 2018-07-23 DIAGNOSIS — S8262XD Displaced fracture of lateral malleolus of left fibula, subsequent encounter for closed fracture with routine healing: Secondary | ICD-10-CM | POA: Diagnosis not present

## 2018-07-23 DIAGNOSIS — M6281 Muscle weakness (generalized): Secondary | ICD-10-CM | POA: Diagnosis not present

## 2018-07-23 DIAGNOSIS — R2681 Unsteadiness on feet: Secondary | ICD-10-CM | POA: Diagnosis not present

## 2018-07-23 DIAGNOSIS — R2689 Other abnormalities of gait and mobility: Secondary | ICD-10-CM | POA: Diagnosis not present

## 2018-07-23 DIAGNOSIS — R278 Other lack of coordination: Secondary | ICD-10-CM | POA: Diagnosis not present

## 2018-07-23 DIAGNOSIS — E119 Type 2 diabetes mellitus without complications: Secondary | ICD-10-CM | POA: Diagnosis not present

## 2018-07-23 DIAGNOSIS — M2550 Pain in unspecified joint: Secondary | ICD-10-CM | POA: Diagnosis not present

## 2018-07-23 DIAGNOSIS — M79672 Pain in left foot: Secondary | ICD-10-CM | POA: Diagnosis not present

## 2018-07-23 DIAGNOSIS — M1990 Unspecified osteoarthritis, unspecified site: Secondary | ICD-10-CM | POA: Diagnosis not present

## 2018-07-23 DIAGNOSIS — I69354 Hemiplegia and hemiparesis following cerebral infarction affecting left non-dominant side: Secondary | ICD-10-CM | POA: Diagnosis not present

## 2018-07-23 DIAGNOSIS — S8262XS Displaced fracture of lateral malleolus of left fibula, sequela: Secondary | ICD-10-CM | POA: Diagnosis not present

## 2018-07-23 DIAGNOSIS — R41841 Cognitive communication deficit: Secondary | ICD-10-CM | POA: Diagnosis not present

## 2018-07-23 DIAGNOSIS — S82842D Displaced bimalleolar fracture of left lower leg, subsequent encounter for closed fracture with routine healing: Secondary | ICD-10-CM | POA: Diagnosis not present

## 2018-07-23 DIAGNOSIS — S82842A Displaced bimalleolar fracture of left lower leg, initial encounter for closed fracture: Secondary | ICD-10-CM | POA: Diagnosis not present

## 2018-07-23 DIAGNOSIS — Z4789 Encounter for other orthopedic aftercare: Secondary | ICD-10-CM | POA: Diagnosis not present

## 2018-07-23 DIAGNOSIS — Z7401 Bed confinement status: Secondary | ICD-10-CM | POA: Diagnosis not present

## 2018-07-23 DIAGNOSIS — Z9889 Other specified postprocedural states: Secondary | ICD-10-CM | POA: Diagnosis not present

## 2018-07-23 DIAGNOSIS — I480 Paroxysmal atrial fibrillation: Secondary | ICD-10-CM | POA: Diagnosis not present

## 2018-07-23 DIAGNOSIS — I10 Essential (primary) hypertension: Secondary | ICD-10-CM | POA: Diagnosis not present

## 2018-07-23 DIAGNOSIS — K5900 Constipation, unspecified: Secondary | ICD-10-CM | POA: Diagnosis not present

## 2018-07-23 DIAGNOSIS — Z5189 Encounter for other specified aftercare: Secondary | ICD-10-CM | POA: Diagnosis not present

## 2018-07-23 NOTE — Care Management Important Message (Signed)
Important Message  Patient Details  Name: HARMONI LUCUS MRN: [id number] Date of Birth: 1941-01-21   Medicare Important Message Given:  Yes    Barb Merino  07/23/2018, 4:52 PM

## 2018-07-23 NOTE — Progress Notes (Signed)
Patient will DC to: [facility] Anticipated DC date: 07/23/18 Family notified: Eduard Clos Transport by: Corey Harold  Per MD patient ready for DC to [facility] . RN, patient, patient's family, and facility notified of DC. Discharge Summary sent to facility. RN given number for report [phone] Room 606P . DC packet on chart. Ambulance transport requested for patient.  CSW signing off.  [place], [place]

## 2018-07-23 NOTE — Plan of Care (Signed)
  Problem: Clinical Measurements: Goal: Will remain free from infection Outcome: Progressing Goal: Respiratory complications will improve Outcome: Progressing Goal: Cardiovascular complication will be avoided Outcome: Progressing   Problem: Nutrition: Goal: Adequate nutrition will be maintained Outcome: Progressing   Problem: Safety: Goal: Ability to remain free from injury will improve Outcome: Progressing   Problem: Skin Integrity: Goal: Risk for impaired skin integrity will decrease Outcome: Progressing

## 2018-07-23 NOTE — Clinical Social Work Placement (Signed)
   CLINICAL SOCIAL WORK PLACEMENT  NOTE  Date:  07/23/2018  Patient Details  Name: LUCETTA BAEHR MRN: [id number] Date of Birth: November 02, 1940  Clinical Social Work is seeking post-discharge placement for this patient at the [place] level of care (*CSW will initial, date and re-position this form in  chart as items are completed):      Patient/family provided with [place] Work Department's list of facilities offering this level of care within the geographic area requested by the patient (or if unable, by the patient's family).  Yes   Patient/family informed of their freedom to choose among providers that offer the needed level of care, that participate in Medicare, Medicaid or managed care program needed by the patient, have an available bed and are willing to accept the patient.      Patient/family informed of [facility]'s ownership interest in [facility] and [facility], as well as of the fact that they are under no obligation to receive care at these facilities.  PASRR submitted to EDS on       PASRR number received on 07/21/18     Existing PASRR number confirmed on       FL2 transmitted to all facilities in geographic area requested by pt/family on 07/21/18     FL2 transmitted to all facilities within larger geographic area on       Patient informed that his/her managed care company has contracts with or will negotiate with certain facilities, including the following:        Yes   Patient/family informed of bed offers received.  Patient chooses bed at [facility]     Physician recommends and patient chooses bed at      Patient to be transferred to [facility] on 07/23/18.  Patient to be transferred to facility by [facility]     Patient family notified on 07/23/18 of transfer.  Name of family member notified:  Eduard Clos (son)     PHYSICIAN       Additional Comment:    _______________________________________________ Alberteen Sam, LCSW 07/23/2018, 3:54 PM

## 2018-07-23 NOTE — Progress Notes (Signed)
Physical Therapy Treatment Patient Details Name: Karen Jackson MRN: [id number] DOB: March 25, 1941 Today's Date: 07/23/2018    History of Present Illness Pt admit for bimalleolar ankle fracture left.  Pt underwent ORIF left ankle 11/23.  PMH:  CHF, Afib, right CVA, right TKA, mastectomy.     PT Comments    Patient seen for mobility progression. Pt is eager to participate in therapy. This session focused on OOB transfers using slide board. Pt requires mod/max A +2 for safety with lateral slide board transfers EOB to w/c then w/c to EOB. Min/mod A required for bed mobility. Continue to progress as tolerated with anticipated d/c to SNF for further skilled PT services.      Follow Up Recommendations  SNF;Supervision/Assistance - 24 hour     Equipment Recommendations  Hospital bed    Recommendations for Other Services       Precautions / Restrictions Precautions Precautions: Fall Required Braces or Orthoses: Other Brace/Splint Other Brace/Splint: CAM walker Restrictions Weight Bearing Restrictions: Yes LLE Weight Bearing: Non weight bearing    Mobility  Bed Mobility Overal bed mobility: Needs Assistance Bed Mobility: Supine to Sit;Sit to Supine     Supine to sit: Min assist Sit to supine: Mod assist   General bed mobility comments: assist to bring L hip toward EOB and then to bring bilat LE into bed; cues for sequencing; use of rail  Transfers Overall transfer level: Needs assistance Equipment used: Sliding board Transfers: Lateral/Scoot Transfers          Lateral/Scoot Transfers: Mod assist;+2 physical assistance;Max assist General transfer comment: pt able to assist minimally with L UE due to limited ROM/strength from previous CVA; cues for sequencing, safe hand placement, anterior translation of trunk, and NWB precautions; mod A +2 with use of bed pad for EOB to w/c and max A +2 with bed pad for w/c to EOB due to difference in bed height  Ambulation/Gait                  Stairs             Wheelchair Mobility    Modified Rankin (Stroke Patients Only)       Balance Overall balance assessment: Needs assistance Sitting-balance support: No upper extremity supported;Feet supported Sitting balance-Leahy Scale: Fair                                      Cognition Arousal/Alertness: Awake/alert Behavior During Therapy: WFL for tasks assessed/performed Overall Cognitive Status: Within Functional Limits for tasks assessed                                        Exercises      General Comments        Pertinent Vitals/Pain      Home Living                      Prior Function            PT Goals (current goals can now be found in the care plan section) Progress towards PT goals: Progressing toward goals    Frequency    Min 6X/week      PT Plan Current plan remains appropriate    Co-evaluation  AM-PAC PT "6 Clicks" Mobility   Outcome Measure  Help needed turning from your back to your side while in a flat bed without using bedrails?: A Little Help needed moving from lying on your back to sitting on the side of a flat bed without using bedrails?: A Little Help needed moving to and from a bed to a chair (including a wheelchair)?: A Lot Help needed standing up from a chair using your arms (e.g., wheelchair or bedside chair)?: Total Help needed to walk in hospital room?: Total Help needed climbing 3-5 steps with a railing? : Total 6 Click Score: 11    End of Session Equipment Utilized During Treatment: Gait belt Activity Tolerance: Patient tolerated treatment well Patient left: in bed;with call bell/phone within reach;with family/visitor present Nurse Communication: Mobility status PT Visit Diagnosis: Muscle weakness (generalized) (M62.81);Unsteadiness on feet (R26.81);History of falling (Z91.81)     Time: 0347-4259 PT Time Calculation (min) (ACUTE  ONLY): 23 min  Charges:  $Therapeutic Activity: 23-37 mins                     Earney Navy, PTA Acute Rehabilitation Services Pager: [phone] Office: [phone]     Darliss Cheney 07/23/2018, 3:32 PM

## 2018-07-23 NOTE — Discharge Summary (Signed)
Physician Discharge Summary  Patient ID: Karen Jackson MRN: [id number] DOB/AGE: 03/31/1941 77 y.o.  Admit date: 07/20/2018 Discharge date: 07/23/2018  Admission Diagnoses: Left ankle fx; hx of vit D def, hypercholeserolemia, HTN, GERD, diverticulosis, osteopenia, DM type II, nontraumatic cerebral hemorrhage, hypomagnesemia, hemiparesis L side, morbid obesity, A-fib, CHF, hypoxemia  Discharge Diagnoses:  Active Problems:   Closed left ankle fracture same as above  Discharged Condition: stable  Hospital Course: Patient presented to [facility] on 07/20/18 for elective ORIF L ankle bimal fx by Dr. Wylene Simmer.  The patient tolerated the procedure well without difficulty.  The patient was then admitted to the hospital.  The patient worked well with therapy and tolerated her stay without complication.  She is to be D/C'd to SNF on 07/23/18.  Consults: PT/OT, SW  Significant Diagnostic Studies: radiology: X-Ray: to ensure satisfactory anatomic alignment during operative procedure.  Treatments: IV hydration, antibiotics: Ancef, analgesia: acetaminophen, Vicodin and Morphine, cardiac meds: metoprolol and diltiazem, anticoagulation: lovenox and surgery: as stated above.  Discharge Exam: Blood pressure (!) 122/93, pulse 76, temperature 99.3 F (37.4 C), temperature source Oral, resp. rate 20, height 4\' 10"  (1.473 m), weight 78.2 kg, SpO2 91 %. General: WDWN patient in NAD. Psych:  Appropriate mood and affect. Neuro:  A&O x 3, Moving all extremities, sensation intact to light touch HEENT:  EOMs intact Chest:  Even non-labored respirations Skin:  Dressing/SLS C/D/I, no rashes or lesions Extremities: warm/dry, mild edema, no erythema or echymosis.  No lymphadenopathy. Pulses: Popliteus 2+ MSK:  ROM: EHL/FHL intact, MMT: able to perform quad set   Disposition:    Allergies as of 07/23/2018      Reactions   Codeine Phosphate Nausea And Vomiting   Simvastatin Other (See Comments)   myalgia      Medication List    TAKE these medications   aspirin EC 81 MG tablet Take 1 tablet (81 mg total) by mouth 2 (two) times daily.   atorvastatin 20 MG tablet Commonly known as:  LIPITOR TAKE 1 TABLET BY MOUTH EVERY DAY   Calcium Carbonate-Vitamin D3 600-400 MG-UNIT Tabs Take 1 tablet by mouth daily.   celecoxib 200 MG capsule Commonly known as:  CELEBREX TAKE 1 CAPSULE BY MOUTH EVERY DAY WITH FOOD What changed:  See the new instructions.   diclofenac sodium 1 % Gel Commonly known as:  VOLTAREN APPLY 2 GRAMS EXTERNALLY TO THE AFFECTED AREA FOUR TIMES DAILY What changed:  See the new instructions.   diltiazem 240 MG 24 hr capsule Commonly known as:  CARDIZEM CD TAKE 1 CAPSULE(240 MG) BY MOUTH DAILY What changed:  See the new instructions.   docusate sodium 100 MG capsule Commonly known as:  COLACE Take 1 capsule (100 mg total) by mouth 2 (two) times daily. While taking narcotic pain medicine.   furosemide 80 MG tablet Commonly known as:  LASIX TAKE 1 TABLET BY MOUTH EVERY DAY   gabapentin 100 MG capsule Commonly known as:  NEURONTIN TAKE 1 CAPSULE BY MOUTH THREE TIMES DAILY   HAIR/SKIN/NAILS Caps Take 3 capsules by mouth daily.   ipratropium-albuterol 0.5-2.5 (3) MG/3ML Soln Commonly known as:  DUONEB Take 3 mLs by nebulization 3 (three) times daily.   MAGNESIUM-OXIDE 400 (241.3 Mg) MG tablet Generic drug:  magnesium oxide TAKE 1 TABLET BY MOUTH DAILY   metoprolol tartrate 50 MG tablet Commonly known as:  LOPRESSOR TAKE 1 TABLET BY MOUTH TWICE DAILY   omeprazole 20 MG capsule Commonly known as:  PRILOSEC  TAKE 1 CAPSULE BY MOUTH EVERY DAY   ondansetron 4 MG tablet Commonly known as:  ZOFRAN Take 1 tablet (4 mg total) by mouth daily as needed for nausea or vomiting.   oxyCODONE 5 MG immediate release tablet Commonly known as:  Oxy IR/ROXICODONE Take 1 tablet (5 mg total) by mouth every 4 (four) hours as needed for up to 5 days for moderate pain  or severe pain.   senna 8.6 MG Tabs tablet Commonly known as:  SENOKOT Take 2 tablets (17.2 mg total) by mouth 2 (two) times daily.   traZODone 50 MG tablet Commonly known as:  DESYREL TAKE 1/2 TO 1 TABLET BY MOUTH AT BEDTIME AS NEEDED FOR SLEEP What changed:    how much to take  when to take this  additional instructions   TURMERIC PO Take 1 capsule by mouth daily.      Follow-up Information    Wylene Simmer, MD. Schedule an appointment as soon as possible for a visit in 2 weeks.   Specialty:  Orthopedic Surgery Contact information: [address] [place] [address] [phone]           Signed: Mohammed Kindle Office:  [phone]

## 2018-07-23 NOTE — Plan of Care (Signed)
  Problem: Education: Goal: Knowledge of General Education information will improve Description Including pain rating scale, medication(s)/side effects and non-pharmacologic comfort measures Outcome: Progressing   Problem: Clinical Measurements: Goal: Ability to maintain clinical measurements within normal limits will improve Outcome: Progressing Goal: Will remain free from infection 07/23/2018 1535 by Linton Flemings, RN Outcome: Progressing 07/23/2018 1447 by Linton Flemings, RN Outcome: Progressing Goal: Respiratory complications will improve Outcome: Progressing Goal: Cardiovascular complication will be avoided Outcome: Progressing   Problem: Nutrition: Goal: Adequate nutrition will be maintained 07/23/2018 1535 by Linton Flemings, RN Outcome: Progressing 07/23/2018 1447 by Linton Flemings, RN Outcome: Progressing   Problem: Elimination: Goal: Will not experience complications related to bowel motility Outcome: Progressing   Problem: Safety: Goal: Ability to remain free from injury will improve 07/23/2018 1535 by Linton Flemings, RN Outcome: Progressing 07/23/2018 1447 by Linton Flemings, RN Outcome: Progressing   Problem: Skin Integrity: Goal: Risk for impaired skin integrity will decrease 07/23/2018 1535 by Linton Flemings, RN Outcome: Progressing 07/23/2018 1447 by Linton Flemings, RN Outcome: Progressing

## 2018-07-23 NOTE — Progress Notes (Signed)
Patient discharged to [facility] via EMS. Report called to Margaretmary Lombard, LPN. All questions answered and concerns addressed.

## 2018-07-23 NOTE — Care Management Important Message (Signed)
Important Message  Patient Details  Name: Karen Jackson MRN: [id number] Date of Birth: Mar 29, 1941   Medicare Important Message Given:  Yes     P  07/23/2018, 4:04 PM

## 2018-07-24 DIAGNOSIS — K5900 Constipation, unspecified: Secondary | ICD-10-CM | POA: Diagnosis not present

## 2018-07-24 DIAGNOSIS — M1990 Unspecified osteoarthritis, unspecified site: Secondary | ICD-10-CM | POA: Diagnosis not present

## 2018-07-24 DIAGNOSIS — I10 Essential (primary) hypertension: Secondary | ICD-10-CM | POA: Diagnosis not present

## 2018-07-24 DIAGNOSIS — S8262XD Displaced fracture of lateral malleolus of left fibula, subsequent encounter for closed fracture with routine healing: Secondary | ICD-10-CM | POA: Diagnosis not present

## 2018-07-29 DIAGNOSIS — M1990 Unspecified osteoarthritis, unspecified site: Secondary | ICD-10-CM | POA: Diagnosis not present

## 2018-07-29 DIAGNOSIS — S8262XD Displaced fracture of lateral malleolus of left fibula, subsequent encounter for closed fracture with routine healing: Secondary | ICD-10-CM | POA: Diagnosis not present

## 2018-07-29 DIAGNOSIS — I10 Essential (primary) hypertension: Secondary | ICD-10-CM | POA: Diagnosis not present

## 2018-07-29 DIAGNOSIS — I480 Paroxysmal atrial fibrillation: Secondary | ICD-10-CM | POA: Diagnosis not present

## 2018-07-31 DIAGNOSIS — I480 Paroxysmal atrial fibrillation: Secondary | ICD-10-CM | POA: Diagnosis not present

## 2018-07-31 DIAGNOSIS — S8262XD Displaced fracture of lateral malleolus of left fibula, subsequent encounter for closed fracture with routine healing: Secondary | ICD-10-CM | POA: Diagnosis not present

## 2018-07-31 DIAGNOSIS — M79672 Pain in left foot: Secondary | ICD-10-CM | POA: Diagnosis not present

## 2018-07-31 DIAGNOSIS — Z9889 Other specified postprocedural states: Secondary | ICD-10-CM | POA: Diagnosis not present

## 2018-08-01 DIAGNOSIS — Z5189 Encounter for other specified aftercare: Secondary | ICD-10-CM | POA: Diagnosis not present

## 2018-08-01 DIAGNOSIS — S82842A Displaced bimalleolar fracture of left lower leg, initial encounter for closed fracture: Secondary | ICD-10-CM | POA: Diagnosis not present

## 2018-08-01 DIAGNOSIS — S82842D Displaced bimalleolar fracture of left lower leg, subsequent encounter for closed fracture with routine healing: Secondary | ICD-10-CM | POA: Diagnosis not present

## 2018-08-07 DIAGNOSIS — I10 Essential (primary) hypertension: Secondary | ICD-10-CM | POA: Diagnosis not present

## 2018-08-07 DIAGNOSIS — S8262XD Displaced fracture of lateral malleolus of left fibula, subsequent encounter for closed fracture with routine healing: Secondary | ICD-10-CM | POA: Diagnosis not present

## 2018-08-07 DIAGNOSIS — I480 Paroxysmal atrial fibrillation: Secondary | ICD-10-CM | POA: Diagnosis not present

## 2018-08-07 DIAGNOSIS — E119 Type 2 diabetes mellitus without complications: Secondary | ICD-10-CM | POA: Diagnosis not present

## 2018-08-12 DIAGNOSIS — I480 Paroxysmal atrial fibrillation: Secondary | ICD-10-CM | POA: Diagnosis not present

## 2018-08-12 DIAGNOSIS — S8262XD Displaced fracture of lateral malleolus of left fibula, subsequent encounter for closed fracture with routine healing: Secondary | ICD-10-CM | POA: Diagnosis not present

## 2018-08-12 DIAGNOSIS — E119 Type 2 diabetes mellitus without complications: Secondary | ICD-10-CM | POA: Diagnosis not present

## 2018-08-12 DIAGNOSIS — M1990 Unspecified osteoarthritis, unspecified site: Secondary | ICD-10-CM | POA: Diagnosis not present

## 2018-08-16 DIAGNOSIS — K219 Gastro-esophageal reflux disease without esophagitis: Secondary | ICD-10-CM | POA: Diagnosis not present

## 2018-08-16 DIAGNOSIS — E119 Type 2 diabetes mellitus without complications: Secondary | ICD-10-CM | POA: Diagnosis not present

## 2018-08-16 DIAGNOSIS — Z791 Long term (current) use of non-steroidal anti-inflammatories (NSAID): Secondary | ICD-10-CM | POA: Diagnosis not present

## 2018-08-16 DIAGNOSIS — S8262XD Displaced fracture of lateral malleolus of left fibula, subsequent encounter for closed fracture with routine healing: Secondary | ICD-10-CM | POA: Diagnosis not present

## 2018-08-16 DIAGNOSIS — E669 Obesity, unspecified: Secondary | ICD-10-CM | POA: Diagnosis not present

## 2018-08-16 DIAGNOSIS — E785 Hyperlipidemia, unspecified: Secondary | ICD-10-CM | POA: Diagnosis not present

## 2018-08-16 DIAGNOSIS — I480 Paroxysmal atrial fibrillation: Secondary | ICD-10-CM | POA: Diagnosis not present

## 2018-08-16 DIAGNOSIS — E8881 Metabolic syndrome: Secondary | ICD-10-CM | POA: Diagnosis not present

## 2018-08-16 DIAGNOSIS — I110 Hypertensive heart disease with heart failure: Secondary | ICD-10-CM | POA: Diagnosis not present

## 2018-08-16 DIAGNOSIS — I5032 Chronic diastolic (congestive) heart failure: Secondary | ICD-10-CM | POA: Diagnosis not present

## 2018-08-19 ENCOUNTER — Telehealth: Payer: Self-pay | Admitting: *Deleted

## 2018-08-19 NOTE — Telephone Encounter (Signed)
Verbal orders given as requested. 

## 2018-08-19 NOTE — Telephone Encounter (Signed)
Jerilyn from [facility] calling for PT verbal orders as follows:  1 time(s) weekly for 3 week(s), then 2 time(s) weekly for 3 week(s)  You can leave verbal orders on confidential voicemail.  [place], Salome Spotted, CMA

## 2018-08-23 DIAGNOSIS — S8262XD Displaced fracture of lateral malleolus of left fibula, subsequent encounter for closed fracture with routine healing: Secondary | ICD-10-CM | POA: Diagnosis not present

## 2018-08-23 DIAGNOSIS — I480 Paroxysmal atrial fibrillation: Secondary | ICD-10-CM | POA: Diagnosis not present

## 2018-08-23 DIAGNOSIS — K219 Gastro-esophageal reflux disease without esophagitis: Secondary | ICD-10-CM | POA: Diagnosis not present

## 2018-08-23 DIAGNOSIS — I110 Hypertensive heart disease with heart failure: Secondary | ICD-10-CM | POA: Diagnosis not present

## 2018-08-23 DIAGNOSIS — I5032 Chronic diastolic (congestive) heart failure: Secondary | ICD-10-CM | POA: Diagnosis not present

## 2018-08-23 DIAGNOSIS — E119 Type 2 diabetes mellitus without complications: Secondary | ICD-10-CM | POA: Diagnosis not present

## 2018-08-26 ENCOUNTER — Telehealth: Payer: Self-pay

## 2018-08-26 ENCOUNTER — Other Ambulatory Visit: Payer: Self-pay | Admitting: Family Medicine

## 2018-08-26 DIAGNOSIS — I5032 Chronic diastolic (congestive) heart failure: Secondary | ICD-10-CM | POA: Diagnosis not present

## 2018-08-26 DIAGNOSIS — I480 Paroxysmal atrial fibrillation: Secondary | ICD-10-CM | POA: Diagnosis not present

## 2018-08-26 DIAGNOSIS — I110 Hypertensive heart disease with heart failure: Secondary | ICD-10-CM | POA: Diagnosis not present

## 2018-08-26 DIAGNOSIS — K219 Gastro-esophageal reflux disease without esophagitis: Secondary | ICD-10-CM | POA: Diagnosis not present

## 2018-08-26 DIAGNOSIS — S8262XD Displaced fracture of lateral malleolus of left fibula, subsequent encounter for closed fracture with routine healing: Secondary | ICD-10-CM | POA: Diagnosis not present

## 2018-08-26 DIAGNOSIS — E119 Type 2 diabetes mellitus without complications: Secondary | ICD-10-CM | POA: Diagnosis not present

## 2018-08-26 NOTE — Telephone Encounter (Signed)
[place], [place] with [facility], called for verbal orders:  [place] OT 2x week for 3 weeks for transfer training, home safety, and self-care re-training.   Call back [phone]. This is a Psychologist, occupational.  Danley Danker, RN [facility] RN)

## 2018-08-27 DIAGNOSIS — I110 Hypertensive heart disease with heart failure: Secondary | ICD-10-CM | POA: Diagnosis not present

## 2018-08-27 DIAGNOSIS — S8262XD Displaced fracture of lateral malleolus of left fibula, subsequent encounter for closed fracture with routine healing: Secondary | ICD-10-CM | POA: Diagnosis not present

## 2018-08-27 DIAGNOSIS — K219 Gastro-esophageal reflux disease without esophagitis: Secondary | ICD-10-CM | POA: Diagnosis not present

## 2018-08-27 DIAGNOSIS — I480 Paroxysmal atrial fibrillation: Secondary | ICD-10-CM | POA: Diagnosis not present

## 2018-08-27 DIAGNOSIS — E119 Type 2 diabetes mellitus without complications: Secondary | ICD-10-CM | POA: Diagnosis not present

## 2018-08-27 DIAGNOSIS — I5032 Chronic diastolic (congestive) heart failure: Secondary | ICD-10-CM | POA: Diagnosis not present

## 2018-08-29 ENCOUNTER — Telehealth: Payer: Self-pay | Admitting: *Deleted

## 2018-08-29 MED ORDER — POLYETHYLENE GLYCOL 3350 17 G PO PACK: 17.0000 g | PACK | Freq: Every day | ORAL | 1 refills | Status: DC

## 2018-08-29 NOTE — Telephone Encounter (Signed)
Verbal orders authorized by Dr. Nori Riis. Information left on Brittany's confidential voicemail. Danley Danker, RN [facility] RN)

## 2018-08-29 NOTE — Telephone Encounter (Signed)
Patient calling stating she has been "sedentary" due to breaking her ankle 6 weeks ago.  Is not having constipation - last BM 5-6 days ago.  Has tried OTC meds and a suppository without relief.  Wants to know if Dr. Andria Frames can Rx something to help with her constipation.  Will route request to PCP for advice and call patient back.  Burna Forts, MSN, RN-BC

## 2018-08-29 NOTE — Telephone Encounter (Signed)
Spoke to pt. She said she has taken both the Miralax and the colace in the same day. She is worried about getting impacted. She said she would try again and if it doesn't work in a couple of days she will call us again. Ottis Stain, CMA

## 2018-08-29 NOTE — Telephone Encounter (Signed)
Dear Karen Jackson Team I sent in some miralax. Have her take the colace she already hes (per her med list) or get some of that OTC and use both for the first week and then after BM becomes regular, use just the miralax. I would haveher take the miralax for at least a month (daily) THANKS! [name]

## 2018-08-30 DIAGNOSIS — Z4789 Encounter for other orthopedic aftercare: Secondary | ICD-10-CM | POA: Diagnosis not present

## 2018-08-30 DIAGNOSIS — M25572 Pain in left ankle and joints of left foot: Secondary | ICD-10-CM | POA: Diagnosis not present

## 2018-09-02 ENCOUNTER — Telehealth: Payer: Self-pay

## 2018-09-02 DIAGNOSIS — I110 Hypertensive heart disease with heart failure: Secondary | ICD-10-CM | POA: Diagnosis not present

## 2018-09-02 DIAGNOSIS — E119 Type 2 diabetes mellitus without complications: Secondary | ICD-10-CM | POA: Diagnosis not present

## 2018-09-02 DIAGNOSIS — S8262XD Displaced fracture of lateral malleolus of left fibula, subsequent encounter for closed fracture with routine healing: Secondary | ICD-10-CM | POA: Diagnosis not present

## 2018-09-02 DIAGNOSIS — K219 Gastro-esophageal reflux disease without esophagitis: Secondary | ICD-10-CM | POA: Diagnosis not present

## 2018-09-02 DIAGNOSIS — I5032 Chronic diastolic (congestive) heart failure: Secondary | ICD-10-CM | POA: Diagnosis not present

## 2018-09-02 DIAGNOSIS — I480 Paroxysmal atrial fibrillation: Secondary | ICD-10-CM | POA: Diagnosis not present

## 2018-09-02 NOTE — Telephone Encounter (Signed)
Faxed to [facility] @ [phone]. , Salome Spotted, CMA

## 2018-09-02 NOTE — Telephone Encounter (Signed)
Patient left message that she needs an order faxed to [facility] to discontinue her portable O2. They will not pick it up without order from PCP.  Danley Danker, RN [facility] RN)

## 2018-09-02 NOTE — Telephone Encounter (Signed)
Called and verified that she no longer needs.  Will give RN team order to fax.

## 2018-09-03 DIAGNOSIS — M1712 Unilateral primary osteoarthritis, left knee: Secondary | ICD-10-CM | POA: Diagnosis not present

## 2018-09-03 DIAGNOSIS — I480 Paroxysmal atrial fibrillation: Secondary | ICD-10-CM | POA: Diagnosis not present

## 2018-09-03 DIAGNOSIS — E119 Type 2 diabetes mellitus without complications: Secondary | ICD-10-CM | POA: Diagnosis not present

## 2018-09-03 DIAGNOSIS — I110 Hypertensive heart disease with heart failure: Secondary | ICD-10-CM | POA: Diagnosis not present

## 2018-09-03 DIAGNOSIS — I5032 Chronic diastolic (congestive) heart failure: Secondary | ICD-10-CM | POA: Diagnosis not present

## 2018-09-03 DIAGNOSIS — Z96652 Presence of left artificial knee joint: Secondary | ICD-10-CM | POA: Diagnosis not present

## 2018-09-03 DIAGNOSIS — S8262XD Displaced fracture of lateral malleolus of left fibula, subsequent encounter for closed fracture with routine healing: Secondary | ICD-10-CM | POA: Diagnosis not present

## 2018-09-03 DIAGNOSIS — K219 Gastro-esophageal reflux disease without esophagitis: Secondary | ICD-10-CM | POA: Diagnosis not present

## 2018-09-03 DIAGNOSIS — S83282A Other tear of lateral meniscus, current injury, left knee, initial encounter: Secondary | ICD-10-CM | POA: Diagnosis not present

## 2018-09-03 NOTE — Telephone Encounter (Signed)
Patients son Caera Enwright dropped off paperwork to be filled out and faxed back to [facility].  Doctor at that practice that treated her is Doctor Lucey.

## 2018-09-04 NOTE — Telephone Encounter (Signed)
Form completed and placed in RN box

## 2018-09-04 NOTE — Telephone Encounter (Signed)
Completed form faxed to [facility] at [phone]. Placed in batch scanning to be placed in chart.  Danley Danker, RN [facility] RN)

## 2018-09-04 NOTE — Telephone Encounter (Signed)
Clinical info completed on Pre Operative Clearance form.  Place form in Dr. Lowella Bandy box for completion.  [name],  D, [place]

## 2018-09-05 DIAGNOSIS — I480 Paroxysmal atrial fibrillation: Secondary | ICD-10-CM | POA: Diagnosis not present

## 2018-09-05 DIAGNOSIS — I5032 Chronic diastolic (congestive) heart failure: Secondary | ICD-10-CM | POA: Diagnosis not present

## 2018-09-05 DIAGNOSIS — S8262XD Displaced fracture of lateral malleolus of left fibula, subsequent encounter for closed fracture with routine healing: Secondary | ICD-10-CM | POA: Diagnosis not present

## 2018-09-05 DIAGNOSIS — E119 Type 2 diabetes mellitus without complications: Secondary | ICD-10-CM | POA: Diagnosis not present

## 2018-09-05 DIAGNOSIS — K219 Gastro-esophageal reflux disease without esophagitis: Secondary | ICD-10-CM | POA: Diagnosis not present

## 2018-09-05 DIAGNOSIS — I110 Hypertensive heart disease with heart failure: Secondary | ICD-10-CM | POA: Diagnosis not present

## 2018-09-06 DIAGNOSIS — E119 Type 2 diabetes mellitus without complications: Secondary | ICD-10-CM | POA: Diagnosis not present

## 2018-09-06 DIAGNOSIS — S8262XD Displaced fracture of lateral malleolus of left fibula, subsequent encounter for closed fracture with routine healing: Secondary | ICD-10-CM | POA: Diagnosis not present

## 2018-09-06 DIAGNOSIS — K219 Gastro-esophageal reflux disease without esophagitis: Secondary | ICD-10-CM | POA: Diagnosis not present

## 2018-09-06 DIAGNOSIS — I110 Hypertensive heart disease with heart failure: Secondary | ICD-10-CM | POA: Diagnosis not present

## 2018-09-06 DIAGNOSIS — I5032 Chronic diastolic (congestive) heart failure: Secondary | ICD-10-CM | POA: Diagnosis not present

## 2018-09-06 DIAGNOSIS — I480 Paroxysmal atrial fibrillation: Secondary | ICD-10-CM | POA: Diagnosis not present

## 2018-09-09 ENCOUNTER — Telehealth: Payer: Self-pay

## 2018-09-09 DIAGNOSIS — R0902 Hypoxemia: Secondary | ICD-10-CM

## 2018-09-09 NOTE — Telephone Encounter (Signed)
Patient left message that per her therapist she still DOES need her oxygen and asks that order be sent to re-instate it.  Danley Danker, RN [facility] RN)

## 2018-09-09 NOTE — Telephone Encounter (Signed)
Spoke to patient.  O2 sats are at time 88% and less.  Does not use port O2.  Only needs stationary equipment at home.  Ordered

## 2018-09-09 NOTE — Telephone Encounter (Signed)
Order faxed to [facility]. Ottis Stain, CMA

## 2018-09-10 DIAGNOSIS — I480 Paroxysmal atrial fibrillation: Secondary | ICD-10-CM | POA: Diagnosis not present

## 2018-09-10 DIAGNOSIS — I5032 Chronic diastolic (congestive) heart failure: Secondary | ICD-10-CM | POA: Diagnosis not present

## 2018-09-10 DIAGNOSIS — E119 Type 2 diabetes mellitus without complications: Secondary | ICD-10-CM | POA: Diagnosis not present

## 2018-09-10 DIAGNOSIS — S8262XD Displaced fracture of lateral malleolus of left fibula, subsequent encounter for closed fracture with routine healing: Secondary | ICD-10-CM | POA: Diagnosis not present

## 2018-09-10 DIAGNOSIS — I110 Hypertensive heart disease with heart failure: Secondary | ICD-10-CM | POA: Diagnosis not present

## 2018-09-10 DIAGNOSIS — K219 Gastro-esophageal reflux disease without esophagitis: Secondary | ICD-10-CM | POA: Diagnosis not present

## 2018-09-11 DIAGNOSIS — K219 Gastro-esophageal reflux disease without esophagitis: Secondary | ICD-10-CM | POA: Diagnosis not present

## 2018-09-11 DIAGNOSIS — E119 Type 2 diabetes mellitus without complications: Secondary | ICD-10-CM | POA: Diagnosis not present

## 2018-09-11 DIAGNOSIS — S8262XD Displaced fracture of lateral malleolus of left fibula, subsequent encounter for closed fracture with routine healing: Secondary | ICD-10-CM | POA: Diagnosis not present

## 2018-09-11 DIAGNOSIS — I480 Paroxysmal atrial fibrillation: Secondary | ICD-10-CM | POA: Diagnosis not present

## 2018-09-11 DIAGNOSIS — I5032 Chronic diastolic (congestive) heart failure: Secondary | ICD-10-CM | POA: Diagnosis not present

## 2018-09-11 DIAGNOSIS — I110 Hypertensive heart disease with heart failure: Secondary | ICD-10-CM | POA: Diagnosis not present

## 2018-09-12 DIAGNOSIS — K219 Gastro-esophageal reflux disease without esophagitis: Secondary | ICD-10-CM | POA: Diagnosis not present

## 2018-09-12 DIAGNOSIS — E119 Type 2 diabetes mellitus without complications: Secondary | ICD-10-CM | POA: Diagnosis not present

## 2018-09-12 DIAGNOSIS — I480 Paroxysmal atrial fibrillation: Secondary | ICD-10-CM | POA: Diagnosis not present

## 2018-09-12 DIAGNOSIS — I5032 Chronic diastolic (congestive) heart failure: Secondary | ICD-10-CM | POA: Diagnosis not present

## 2018-09-12 DIAGNOSIS — S8262XD Displaced fracture of lateral malleolus of left fibula, subsequent encounter for closed fracture with routine healing: Secondary | ICD-10-CM | POA: Diagnosis not present

## 2018-09-12 DIAGNOSIS — I110 Hypertensive heart disease with heart failure: Secondary | ICD-10-CM | POA: Diagnosis not present

## 2018-09-13 DIAGNOSIS — E119 Type 2 diabetes mellitus without complications: Secondary | ICD-10-CM | POA: Diagnosis not present

## 2018-09-13 DIAGNOSIS — K219 Gastro-esophageal reflux disease without esophagitis: Secondary | ICD-10-CM | POA: Diagnosis not present

## 2018-09-13 DIAGNOSIS — I5032 Chronic diastolic (congestive) heart failure: Secondary | ICD-10-CM | POA: Diagnosis not present

## 2018-09-13 DIAGNOSIS — I110 Hypertensive heart disease with heart failure: Secondary | ICD-10-CM | POA: Diagnosis not present

## 2018-09-13 DIAGNOSIS — I480 Paroxysmal atrial fibrillation: Secondary | ICD-10-CM | POA: Diagnosis not present

## 2018-09-13 DIAGNOSIS — S8262XD Displaced fracture of lateral malleolus of left fibula, subsequent encounter for closed fracture with routine healing: Secondary | ICD-10-CM | POA: Diagnosis not present

## 2018-09-18 DIAGNOSIS — I5032 Chronic diastolic (congestive) heart failure: Secondary | ICD-10-CM | POA: Diagnosis not present

## 2018-09-18 DIAGNOSIS — K219 Gastro-esophageal reflux disease without esophagitis: Secondary | ICD-10-CM | POA: Diagnosis not present

## 2018-09-18 DIAGNOSIS — S8262XD Displaced fracture of lateral malleolus of left fibula, subsequent encounter for closed fracture with routine healing: Secondary | ICD-10-CM | POA: Diagnosis not present

## 2018-09-18 DIAGNOSIS — I110 Hypertensive heart disease with heart failure: Secondary | ICD-10-CM | POA: Diagnosis not present

## 2018-09-18 DIAGNOSIS — E119 Type 2 diabetes mellitus without complications: Secondary | ICD-10-CM | POA: Diagnosis not present

## 2018-09-18 DIAGNOSIS — I480 Paroxysmal atrial fibrillation: Secondary | ICD-10-CM | POA: Diagnosis not present

## 2018-09-19 ENCOUNTER — Encounter: Payer: Self-pay | Admitting: Family Medicine

## 2018-09-19 NOTE — Progress Notes (Signed)
Letter for home health per their request.

## 2018-09-20 DIAGNOSIS — I110 Hypertensive heart disease with heart failure: Secondary | ICD-10-CM | POA: Diagnosis not present

## 2018-09-20 DIAGNOSIS — I5032 Chronic diastolic (congestive) heart failure: Secondary | ICD-10-CM | POA: Diagnosis not present

## 2018-09-20 DIAGNOSIS — S8262XD Displaced fracture of lateral malleolus of left fibula, subsequent encounter for closed fracture with routine healing: Secondary | ICD-10-CM | POA: Diagnosis not present

## 2018-09-20 DIAGNOSIS — I480 Paroxysmal atrial fibrillation: Secondary | ICD-10-CM | POA: Diagnosis not present

## 2018-09-20 DIAGNOSIS — E119 Type 2 diabetes mellitus without complications: Secondary | ICD-10-CM | POA: Diagnosis not present

## 2018-09-20 DIAGNOSIS — K219 Gastro-esophageal reflux disease without esophagitis: Secondary | ICD-10-CM | POA: Diagnosis not present

## 2018-09-24 DIAGNOSIS — K219 Gastro-esophageal reflux disease without esophagitis: Secondary | ICD-10-CM | POA: Diagnosis not present

## 2018-09-24 DIAGNOSIS — I110 Hypertensive heart disease with heart failure: Secondary | ICD-10-CM | POA: Diagnosis not present

## 2018-09-24 DIAGNOSIS — I480 Paroxysmal atrial fibrillation: Secondary | ICD-10-CM | POA: Diagnosis not present

## 2018-09-24 DIAGNOSIS — E119 Type 2 diabetes mellitus without complications: Secondary | ICD-10-CM | POA: Diagnosis not present

## 2018-09-24 DIAGNOSIS — I5032 Chronic diastolic (congestive) heart failure: Secondary | ICD-10-CM | POA: Diagnosis not present

## 2018-09-24 DIAGNOSIS — S8262XD Displaced fracture of lateral malleolus of left fibula, subsequent encounter for closed fracture with routine healing: Secondary | ICD-10-CM | POA: Diagnosis not present

## 2018-09-27 DIAGNOSIS — S8262XD Displaced fracture of lateral malleolus of left fibula, subsequent encounter for closed fracture with routine healing: Secondary | ICD-10-CM | POA: Diagnosis not present

## 2018-09-27 DIAGNOSIS — I110 Hypertensive heart disease with heart failure: Secondary | ICD-10-CM | POA: Diagnosis not present

## 2018-09-27 DIAGNOSIS — E119 Type 2 diabetes mellitus without complications: Secondary | ICD-10-CM | POA: Diagnosis not present

## 2018-09-27 DIAGNOSIS — K219 Gastro-esophageal reflux disease without esophagitis: Secondary | ICD-10-CM | POA: Diagnosis not present

## 2018-09-27 DIAGNOSIS — I480 Paroxysmal atrial fibrillation: Secondary | ICD-10-CM | POA: Diagnosis not present

## 2018-09-27 DIAGNOSIS — I5032 Chronic diastolic (congestive) heart failure: Secondary | ICD-10-CM | POA: Diagnosis not present

## 2018-09-30 DIAGNOSIS — Z5189 Encounter for other specified aftercare: Secondary | ICD-10-CM | POA: Diagnosis not present

## 2018-09-30 DIAGNOSIS — M25572 Pain in left ankle and joints of left foot: Secondary | ICD-10-CM | POA: Diagnosis not present

## 2018-09-30 DIAGNOSIS — S82842A Displaced bimalleolar fracture of left lower leg, initial encounter for closed fracture: Secondary | ICD-10-CM | POA: Diagnosis not present

## 2018-10-01 DIAGNOSIS — I110 Hypertensive heart disease with heart failure: Secondary | ICD-10-CM | POA: Diagnosis not present

## 2018-10-01 DIAGNOSIS — I480 Paroxysmal atrial fibrillation: Secondary | ICD-10-CM | POA: Diagnosis not present

## 2018-10-01 DIAGNOSIS — I5032 Chronic diastolic (congestive) heart failure: Secondary | ICD-10-CM | POA: Diagnosis not present

## 2018-10-01 DIAGNOSIS — E119 Type 2 diabetes mellitus without complications: Secondary | ICD-10-CM | POA: Diagnosis not present

## 2018-10-01 DIAGNOSIS — K219 Gastro-esophageal reflux disease without esophagitis: Secondary | ICD-10-CM | POA: Diagnosis not present

## 2018-10-01 DIAGNOSIS — S8262XD Displaced fracture of lateral malleolus of left fibula, subsequent encounter for closed fracture with routine healing: Secondary | ICD-10-CM | POA: Diagnosis not present

## 2018-10-02 DIAGNOSIS — E119 Type 2 diabetes mellitus without complications: Secondary | ICD-10-CM | POA: Diagnosis not present

## 2018-10-02 DIAGNOSIS — I480 Paroxysmal atrial fibrillation: Secondary | ICD-10-CM | POA: Diagnosis not present

## 2018-10-02 DIAGNOSIS — I110 Hypertensive heart disease with heart failure: Secondary | ICD-10-CM | POA: Diagnosis not present

## 2018-10-02 DIAGNOSIS — I5032 Chronic diastolic (congestive) heart failure: Secondary | ICD-10-CM | POA: Diagnosis not present

## 2018-10-02 DIAGNOSIS — S8262XD Displaced fracture of lateral malleolus of left fibula, subsequent encounter for closed fracture with routine healing: Secondary | ICD-10-CM | POA: Diagnosis not present

## 2018-10-02 DIAGNOSIS — K219 Gastro-esophageal reflux disease without esophagitis: Secondary | ICD-10-CM | POA: Diagnosis not present

## 2018-10-03 DIAGNOSIS — I110 Hypertensive heart disease with heart failure: Secondary | ICD-10-CM | POA: Diagnosis not present

## 2018-10-03 DIAGNOSIS — E119 Type 2 diabetes mellitus without complications: Secondary | ICD-10-CM | POA: Diagnosis not present

## 2018-10-03 DIAGNOSIS — K219 Gastro-esophageal reflux disease without esophagitis: Secondary | ICD-10-CM | POA: Diagnosis not present

## 2018-10-03 DIAGNOSIS — S8262XD Displaced fracture of lateral malleolus of left fibula, subsequent encounter for closed fracture with routine healing: Secondary | ICD-10-CM | POA: Diagnosis not present

## 2018-10-03 DIAGNOSIS — I5032 Chronic diastolic (congestive) heart failure: Secondary | ICD-10-CM | POA: Diagnosis not present

## 2018-10-03 DIAGNOSIS — I480 Paroxysmal atrial fibrillation: Secondary | ICD-10-CM | POA: Diagnosis not present

## 2018-10-04 ENCOUNTER — Other Ambulatory Visit: Payer: Self-pay | Admitting: Family Medicine

## 2018-10-04 DIAGNOSIS — I5030 Unspecified diastolic (congestive) heart failure: Secondary | ICD-10-CM

## 2018-10-07 ENCOUNTER — Other Ambulatory Visit: Payer: Self-pay | Admitting: Orthopedic Surgery

## 2018-10-07 DIAGNOSIS — I110 Hypertensive heart disease with heart failure: Secondary | ICD-10-CM | POA: Diagnosis not present

## 2018-10-07 DIAGNOSIS — I5032 Chronic diastolic (congestive) heart failure: Secondary | ICD-10-CM | POA: Diagnosis not present

## 2018-10-07 DIAGNOSIS — S8262XD Displaced fracture of lateral malleolus of left fibula, subsequent encounter for closed fracture with routine healing: Secondary | ICD-10-CM | POA: Diagnosis not present

## 2018-10-07 DIAGNOSIS — E119 Type 2 diabetes mellitus without complications: Secondary | ICD-10-CM | POA: Diagnosis not present

## 2018-10-07 DIAGNOSIS — K219 Gastro-esophageal reflux disease without esophagitis: Secondary | ICD-10-CM | POA: Diagnosis not present

## 2018-10-07 DIAGNOSIS — I480 Paroxysmal atrial fibrillation: Secondary | ICD-10-CM | POA: Diagnosis not present

## 2018-10-10 DIAGNOSIS — I110 Hypertensive heart disease with heart failure: Secondary | ICD-10-CM | POA: Diagnosis not present

## 2018-10-10 DIAGNOSIS — E119 Type 2 diabetes mellitus without complications: Secondary | ICD-10-CM | POA: Diagnosis not present

## 2018-10-10 DIAGNOSIS — K219 Gastro-esophageal reflux disease without esophagitis: Secondary | ICD-10-CM | POA: Diagnosis not present

## 2018-10-10 DIAGNOSIS — I480 Paroxysmal atrial fibrillation: Secondary | ICD-10-CM | POA: Diagnosis not present

## 2018-10-10 DIAGNOSIS — I5032 Chronic diastolic (congestive) heart failure: Secondary | ICD-10-CM | POA: Diagnosis not present

## 2018-10-10 DIAGNOSIS — S8262XD Displaced fracture of lateral malleolus of left fibula, subsequent encounter for closed fracture with routine healing: Secondary | ICD-10-CM | POA: Diagnosis not present

## 2018-10-17 ENCOUNTER — Telehealth: Payer: Self-pay | Admitting: *Deleted

## 2018-10-17 MED ORDER — DOXYCYCLINE HYCLATE 100 MG PO TABS: 100.0000 mg | ORAL_TABLET | Freq: Two times a day (BID) | ORAL | 0 refills | Status: DC

## 2018-10-17 NOTE — Telephone Encounter (Signed)
Seen by PT yesterday.  Red rash on leg "might be the beginning of cellulitis."  No fever.  Rash/redness is less today.  Discussed no antibiotics now.  I did send in RX in case worsens to combat the logistics of snow storm and weekend both coming.  Will not fill unless rash worsens.

## 2018-10-17 NOTE — Telephone Encounter (Signed)
Patient states that her ankle is red and swollen and her home health PT was concerned about cellulitis. Pt has been sitting with her leg elevated which helps but thinks she may need and abx.   Advised that she will likely need an appt but she is hesitant because "it cost me $90 to get the the doctor".  She would like for me to check with Dr. Andria Frames first. [place], Salome Spotted, [place]

## 2018-10-17 NOTE — Addendum Note (Signed)
Addended by: Zenia Resides on: 10/17/2018 12:25 PM   Modules accepted: Orders

## 2018-10-30 ENCOUNTER — Encounter (HOSPITAL_COMMUNITY): Payer: Self-pay

## 2018-10-30 NOTE — Patient Instructions (Signed)
Karen Jackson  10/30/2018   Your procedure is scheduled on: 11-11-18  Report to [facility] Main  Entrance             Report to admitting at       0850 AM    Call this number if you have problems the morning of surgery [phone]    Remember: Do not eat food or drink liquids :After Midnight.  BRUSH YOUR TEETH MORNING OF SURGERY AND RINSE YOUR MOUTH OUT, NO CHEWING GUM CANDY OR MINTS.     Take these medicines the morning of surgery with A SIP OF WATER: omeprazole, metoprolol, gabapentin, dilitiazem                                You may not have any metal on your body including hair pins and              piercings  Do not wear jewelry, make-up, lotions, powders or perfumes, deodorant             Do not wear nail polish.  Do not shave  48 hours prior to surgery.               Do not bring valuables to the hospital. [place].  Contacts, dentures or bridgework may not be worn into surgery.  Leave suitcase in the car. After surgery it may be brought to your room.                 Please read over the following fact sheets you were given: _____________________________________________________________________           [facility] - Preparing for Surgery Before surgery, you can play an important role.  Because skin is not sterile, your skin needs to be as free of germs as possible.  You can reduce the number of germs on your skin by washing with CHG (chlorahexidine gluconate) soap before surgery.  CHG is an antiseptic cleaner which kills germs and bonds with the skin to continue killing germs even after washing. Please DO NOT use if you have an allergy to CHG or antibacterial soaps.  If your skin becomes reddened/irritated stop using the CHG and inform your nurse when you arrive at Short Stay. Do not shave (including legs and underarms) for at least 48 hours prior to the first CHG shower.  You may shave your  face/neck. Please follow these instructions carefully:  1.  Shower with CHG Soap the night before surgery and the  morning of Surgery.  2.  If you choose to wash your hair, wash your hair first as usual with your  normal  shampoo.  3.  After you shampoo, rinse your hair and body thoroughly to remove the  shampoo.                           4.  Use CHG as you would any other liquid soap.  You can apply chg directly  to the skin and wash                       Gently with a scrungie or clean washcloth.  5.  Apply the CHG Soap to your body ONLY FROM THE NECK DOWN.   Do not use on face/ open                           Wound or open sores. Avoid contact with eyes, ears mouth and genitals (private parts).                       Wash face,  Genitals (private parts) with your normal soap.             6.  Wash thoroughly, paying special attention to the area where your surgery  will be performed.  7.  Thoroughly rinse your body with warm water from the neck down.  8.  DO NOT shower/wash with your normal soap after using and rinsing off  the CHG Soap.                9.  Pat yourself dry with a clean towel.            10.  Wear clean pajamas.            11.  Place clean sheets on your bed the night of your first shower and do not  sleep with pets. Day of Surgery : Do not apply any lotions/deodorants the morning of surgery.  Please wear clean clothes to the hospital/surgery center.  FAILURE TO FOLLOW THESE INSTRUCTIONS MAY RESULT IN THE CANCELLATION OF YOUR SURGERY PATIENT SIGNATURE_________________________________  NURSE SIGNATURE__________________________________  ________________________________________________________________________   Adam Phenix  An incentive spirometer is a tool that can help keep your lungs clear and active. This tool measures how well you are filling your lungs with each breath. Taking long deep breaths may help reverse or decrease the chance of developing breathing  (pulmonary) problems (especially infection) following:  A long period of time when you are unable to move or be active. BEFORE THE PROCEDURE   If the spirometer includes an indicator to show your best effort, your nurse or respiratory therapist will set it to a desired goal.  If possible, sit up straight or lean slightly forward. Try not to slouch.  Hold the incentive spirometer in an upright position. INSTRUCTIONS FOR USE  1. Sit on the edge of your bed if possible, or sit up as far as you can in bed or on a chair. 2. Hold the incentive spirometer in an upright position. 3. Breathe out normally. 4. Place the mouthpiece in your mouth and seal your lips tightly around it. 5. Breathe in slowly and as deeply as possible, raising the piston or the ball toward the top of the column. 6. Hold your breath for 3-5 seconds or for as long as possible. Allow the piston or ball to fall to the bottom of the column. 7. Remove the mouthpiece from your mouth and breathe out normally. 8. Rest for a few seconds and repeat Steps 1 through 7 at least 10 times every 1-2 hours when you are awake. Take your time and take a few normal breaths between deep breaths. 9. The spirometer may include an indicator to show your best effort. Use the indicator as a goal to work toward during each repetition. 10. After each set of 10 deep breaths, practice coughing to be sure your lungs are clear. If you have an incision (the cut made at the time of surgery), support your incision when coughing by placing a pillow or  rolled up towels firmly against it. Once you are able to get out of bed, walk around indoors and cough well. You may stop using the incentive spirometer when instructed by your caregiver.  RISKS AND COMPLICATIONS  Take your time so you do not get dizzy or light-headed.  If you are in pain, you may need to take or ask for pain medication before doing incentive spirometry. It is harder to take a deep breath if you  are having pain. AFTER USE  Rest and breathe slowly and easily.  It can be helpful to keep track of a log of your progress. Your caregiver can provide you with a simple table to help with this. If you are using the spirometer at home, follow these instructions: [place] IF:   You are having difficultly using the spirometer.  You have trouble using the spirometer as often as instructed.  Your pain medication is not giving enough relief while using the spirometer.  You develop fever of 100.5 F (38.1 C) or higher. SEEK IMMEDIATE MEDICAL CARE IF:   You cough up bloody sputum that had not been present before.  You develop fever of 102 F (38.9 C) or greater.  You develop worsening pain at or near the incision site. MAKE SURE YOU:   Understand these instructions.  Will watch your condition.  Will get help right away if you are not doing well or get worse. Document Released: 12/25/2006 Document Revised: 11/06/2011 Document Reviewed: 02/25/2007 [facility] Patient Information 2014 [place], [place].   ________________________________________________________________________

## 2018-11-05 ENCOUNTER — Ambulatory Visit (HOSPITAL_COMMUNITY): Payer: Medicare Other | Admitting: Anesthesiology

## 2018-11-05 ENCOUNTER — Other Ambulatory Visit: Payer: Self-pay

## 2018-11-05 ENCOUNTER — Ambulatory Visit (HOSPITAL_COMMUNITY): Payer: Medicare Other | Admitting: Physician Assistant

## 2018-11-05 ENCOUNTER — Encounter (HOSPITAL_COMMUNITY): Payer: Self-pay

## 2018-11-05 ENCOUNTER — Encounter (HOSPITAL_COMMUNITY)
Admission: RE | Admit: 2018-11-05 | Discharge: 2018-11-05 | Disposition: A | Discharge status: Home / Self Care | Payer: Medicare Other | Source: Ambulatory Visit | Attending: Orthopedic Surgery | Admitting: Orthopedic Surgery

## 2018-11-05 DIAGNOSIS — Z01812 Encounter for preprocedural laboratory examination: Secondary | ICD-10-CM | POA: Insufficient documentation

## 2018-11-05 HISTORY — DX: Paralytic syndrome, unspecified: G83.9

## 2018-11-05 HISTORY — DX: Myoneural disorder, unspecified: G70.9

## 2018-11-05 HISTORY — DX: Other specified postprocedural states: Z98.890

## 2018-11-05 HISTORY — DX: Cardiac arrhythmia, unspecified: I49.9

## 2018-11-05 HISTORY — DX: Nausea with vomiting, unspecified: R11.2

## 2018-11-05 LAB — CBC WITH DIFFERENTIAL/PLATELET
Abs Immature Granulocytes: 0.04 10*3/uL (ref 0.00–0.07)
Basophils Absolute: 0.1 10*3/uL (ref 0.0–0.1)
Basophils Relative: 1 %
EOS PCT: 3 %
Eosinophils Absolute: 0.2 10*3/uL (ref 0.0–0.5)
HCT: 46.7 % — ABNORMAL HIGH (ref 36.0–46.0)
Hemoglobin: 14.9 g/dL (ref 12.0–15.0)
Immature Granulocytes: 0 %
Lymphocytes Relative: 21 %
Lymphs Abs: 1.9 10*3/uL (ref 0.7–4.0)
MCH: 31.8 pg (ref 26.0–34.0)
MCHC: 31.9 g/dL (ref 30.0–36.0)
MCV: 99.8 fL (ref 80.0–100.0)
MONO ABS: 1 10*3/uL (ref 0.1–1.0)
Monocytes Relative: 11 %
Neutro Abs: 5.8 10*3/uL (ref 1.7–7.7)
Neutrophils Relative %: 64 %
Platelets: 397 10*3/uL (ref 150–400)
RBC: 4.68 MIL/uL (ref 3.87–5.11)
RDW: 13.4 % (ref 11.5–15.5)
WBC: 9 10*3/uL (ref 4.0–10.5)
nRBC: 0 % (ref 0.0–0.2)

## 2018-11-05 LAB — COMPREHENSIVE METABOLIC PANEL
ALT: 17 U/L (ref 0–44)
AST: 23 U/L (ref 15–41)
Albumin: 4.1 g/dL (ref 3.5–5.0)
Alkaline Phosphatase: 71 U/L (ref 38–126)
Anion gap: 10 (ref 5–15)
BUN: 16 mg/dL (ref 8–23)
CO2: 32 mmol/L (ref 22–32)
Calcium: 9.3 mg/dL (ref 8.9–10.3)
Chloride: 96 mmol/L — ABNORMAL LOW (ref 98–111)
Creatinine, Ser: 0.61 mg/dL (ref 0.44–1.00)
GFR calc non Af Amer: >60 mL/min (ref >60)
Glucose, Bld: 160 mg/dL — ABNORMAL HIGH (ref 70–99)
Potassium: 4.2 mmol/L (ref 3.5–5.1)
Sodium: 138 mmol/L (ref 135–145)
Total Bilirubin: 0.6 mg/dL (ref 0.3–1.2)
Total Protein: 7.7 g/dL (ref 6.5–8.1)

## 2018-11-05 LAB — SURGICAL PCR SCREEN
MRSA, PCR: NEGATIVE
Staphylococcus aureus: NEGATIVE

## 2018-11-05 LAB — HEMOGLOBIN A1C
Hgb A1c MFr Bld: 6.5 % — ABNORMAL HIGH (ref 4.8–5.6)
Mean Plasma Glucose: 139.85 mg/dL

## 2018-11-05 NOTE — Progress Notes (Signed)
Clearance Dr. Andria Frames  On chart

## 2018-11-05 NOTE — Progress Notes (Signed)
PCP: Madison Hickman  CARDIOLOGIST:  NONE  INFO IN Epic:  Extensive hx chf , stroke hemmoraghi,  Left partial paralysis, pre DM, Afib  ekg 07-20-18 epic  Echo 03-21-17 epic  INFO ON CHART: Clearance Madison Hickman PCP on Chart   BLOOD THINNERS AND LAST DOSES:none ____________________________________  PATIENT SYMPTOMS AT TIME OF PREOP:none

## 2018-11-06 NOTE — Progress Notes (Signed)
Anesthesia Chart Review   Case:  [id number] Date/Time:  11/11/18 1104   Procedure:  TOTAL KNEE ARTHROPLASTY (Left )   Anesthesia type:  Spinal   Pre-op diagnosis:  LT. KNEE OSTEOARHTRITIS   Location:  [place] [facility] / [facility]   Surgeon:  Vickey Huger, MD      DISCUSSION: 78 yo former smoker (11.5 pack years, quit 03/09/81) with h/o PONV, HLD, CHF, neuromuscular disorder, A-fib, hemorrhagic stroke (2013, partial left sided weakness), left knee OA scheduled for above procedure 11/11/18 with Dr. Vickey Huger.   Clearance received from PCP, Dr. Madison Hickman, which states pt is cleared for surgery from medical and cardiac standpoint.  "A-fib, paroxysmal.  On aspirin, no full anticoagulation due to previous hemorrhagic CVA.  Pt is low risk for the intended procedure."  Pt can proceed with planned procedure barring acute status change.  VS: BP (!) 105/59 (BP Location: Right Arm)   Pulse (!) 41   Temp 36.7 C (Oral)   Resp 18   Ht 4' 10.5" (1.486 m)   SpO2 93%   BMI 35.42 kg/m   PROVIDERS: Zenia Resides, MD is PCP    LABS: Labs reviewed: Acceptable for surgery. (all labs ordered are listed, but only abnormal results are displayed)  Labs Reviewed  CBC WITH DIFFERENTIAL/PLATELET - Abnormal; Notable for the following components:      Result Value   HCT 46.7 (*)    All other components within normal limits  COMPREHENSIVE METABOLIC PANEL - Abnormal; Notable for the following components:   Chloride 96 (*)    Glucose, Bld 160 (*)    All other components within normal limits  HEMOGLOBIN A1C - Abnormal; Notable for the following components:   Hgb A1c MFr Bld 6.5 (*)    All other components within normal limits  SURGICAL PCR SCREEN     IMAGES:   EKG: 07/20/18 Rate 75 bpm Sinus rhythm with 1st degree AV block with fusion complexes Septal infarct, age undetermined Abnormal ECG Since last tracing SR has replaced a-fib  CV: Echo 03/21/17 Study Conclusions  - Left ventricle: The  cavity size was normal. Systolic function was   normal. The estimated ejection fraction was in the range of 60%   to 65%. Wall motion was normal; there were no regional wall   motion abnormalities. There was a reduced contribution of atrial   contraction to ventricular filling, due to increased ventricular   diastolic pressure or atrial contractile dysfunction. Doppler   parameters are consistent with a reversible restrictive pattern,   indicative of decreased left ventricular diastolic compliance   and/or increased left atrial pressure (grade 3 diastolic   dysfunction). Doppler parameters are consistent with high   ventricular filling pressure. - Aortic valve: Poorly visualized. - Mitral valve: Calcified annulus. There was mild regurgitation. - Left atrium: The atrium was mildly to moderately dilated.   Anterior-posterior dimension: 48 mm. - Right atrium: The atrium was mildly dilated. - Pulmonic valve: Poorly visualized. - Pulmonary arteries: Systolic pressure could not be accurately   estimated. Past Medical History:  Diagnosis Date  . Acute cystitis without hematuria   . Acute diastolic CHF (congestive heart failure) ([place])   . Arthritis   . Dyspnea   . Dysrhythmia   . Fever of unknown origin 03/19/2017  . Hyperlipidemia   . Hypertension    denies at preop  . Multifocal pneumonia   . Neuromuscular disorder ([place])    neuropathy left arm and foot  . Osteopenia   .  Paralysis ([place])    partial left side from CVA   . Persistent atrial fibrillation   . PONV (postoperative nausea and vomiting)   . Pre-diabetes   . Stroke [facility]) 2013   hemmorahgic    Past Surgical History:  Procedure Laterality Date  . ANKLE SURGERY    . APPENDECTOMY    . CHOLECYSTECTOMY    . HERNIA REPAIR     Esophagus  . JOINT REPLACEMENT     total- right partial- left  . MASTECTOMY PARTIAL / LUMPECTOMY  2012   left  . ORIF ANKLE FRACTURE Left 07/20/2018   Procedure: OPEN REDUCTION INTERNAL FIXATION  (ORIF) ANKLE FRACTURE;  Surgeon: Wylene Simmer, MD;  Location: [place];  Service: Orthopedics;  Laterality: Left;    MEDICATIONS: . aspirin EC 81 MG tablet  . atorvastatin (LIPITOR) 20 MG tablet  . Calcium Carb-Cholecalciferol (CALCIUM CARBONATE-VITAMIN D3) 600-400 MG-UNIT TABS  . celecoxib (CELEBREX) 200 MG capsule  . diclofenac sodium (VOLTAREN) 1 % GEL  . diltiazem (CARDIZEM CD) 240 MG 24 hr capsule  . docusate sodium (COLACE) 100 MG capsule  . doxycycline (VIBRA-TABS) 100 MG tablet  . furosemide (LASIX) 80 MG tablet  . gabapentin (NEURONTIN) 100 MG capsule  . ipratropium-albuterol (DUONEB) 0.5-2.5 (3) MG/3ML SOLN  . MAGNESIUM-OXIDE 400 (241.3 Mg) MG tablet  . metoprolol tartrate (LOPRESSOR) 50 MG tablet  . Multiple Vitamins-Minerals (HAIR/SKIN/NAILS) CAPS  . omeprazole (PRILOSEC) 20 MG capsule  . ondansetron (ZOFRAN) 4 MG tablet  . polyethylene glycol (MIRALAX / GLYCOLAX) packet  . senna (SENOKOT) 8.6 MG TABS tablet  . traZODone (DESYREL) 50 MG tablet  . TURMERIC PO   No current facility-administered medications for this encounter.     Maia Plan [facility] Pre-Surgical Testing [phone] 11/06/18 4:09 PM

## 2018-11-06 NOTE — Anesthesia Preprocedure Evaluation (Addendum)
Anesthesia Evaluation  Patient identified by MRN, date of birth, ID band Patient awake    Reviewed: Allergy & Precautions, NPO status , Patient's Chart, lab work & pertinent test results, reviewed documented beta blocker date and time   History of Anesthesia Complications (+) PONV and history of anesthetic complications  Airway Mallampati: II  TM Distance: >3 FB Neck ROM: Full    Dental  (+) Dental Advisory Given, Teeth Intact, Caps   Pulmonary shortness of breath, pneumonia, former smoker,    Pulmonary exam normal breath sounds clear to auscultation       Cardiovascular hypertension, Pt. on medications and Pt. on home beta blockers +CHF  Normal cardiovascular exam+ dysrhythmias  Rhythm:Regular Rate:Normal  Echo 2018 - Left ventricle: The cavity size was normal. Systolic function was  normal. The estimated ejection fraction was in the range of 60%  to 65%. Wall motion was normal; there were no regional wall  motion abnormalities. There was a reduced contribution of atrial  contraction to ventricular filling, due to increased ventricular diastolic pressure or atrial contractile dysfunction. Doppler parameters are consistent with a reversible restrictive pattern,  indicative of decreased left ventricular diastolic compliance and/or increased left atrial pressure (grade 3 diastolic dysfunction). Doppler parameters are consistent with high ventricular filling pressure. - Aortic valve: Poorly visualized. - Mitral valve: Calcified annulus. There was mild regurgitation. - Left atrium: The atrium was mildly to moderately dilated.   Anterior-posterior dimension: 48 mm. - Right atrium: The atrium was mildly dilated. - Pulmonic valve: Poorly visualized. - Pulmonary arteries: Systolic pressure could not be accurately  estimated.   Neuro/Psych CVA negative psych ROS   GI/Hepatic Neg liver ROS, GERD  ,  Endo/Other  diabetes   Renal/GU negative Renal ROS     Musculoskeletal  (+) Arthritis ,   Abdominal (+) + obese,   Peds  Hematology negative hematology ROS (+)   Anesthesia Other Findings   Reproductive/Obstetrics negative OB ROS                                                             Anesthesia Evaluation  Patient identified by MRN, date of birth, ID band Patient awake    Reviewed: Allergy & Precautions, NPO status , Patient's Chart, lab work & pertinent test results, reviewed documented beta blocker date and time   Airway Mallampati: II  TM Distance: >3 FB Neck ROM: Full    Dental  (+) Teeth Intact, Dental Advisory Given   Pulmonary former smoker,    Pulmonary exam normal breath sounds clear to auscultation       Cardiovascular hypertension, Pt. on home beta blockers and Pt. on medications +CHF  + dysrhythmias Atrial Fibrillation  Rhythm:Irregular Rate:Normal  Echo 03/21/17: Study Conclusions  - Left ventricle: The cavity size was normal. Systolic function was normal. The estimated ejection fraction was in the range of 60% to 65%. Wall motion was normal; there were no regional wall motion abnormalities. There was a reduced contribution of atrial contraction to ventricular filling, due to increased ventricular diastolic pressure or atrial contractile dysfunction. Doppler   parameters are consistent with a reversible restrictive pattern, indicative of decreased left ventricular diastolic compliance and/or increased left atrial pressure (grade 3 diastolic dysfunction). Doppler parameters are consistent with high ventricular filling pressure. - Aortic  valve: Poorly visualized. - Mitral valve: Calcified annulus. There was mild regurgitation. - Left atrium: The atrium was mildly to moderately dilated.   Anterior-posterior dimension: 48 mm. - Right atrium: The atrium was mildly dilated. - Pulmonic valve: Poorly visualized. - Pulmonary arteries: Systolic  pressure could not be accurately estimated.   Neuro/Psych CVA    GI/Hepatic Neg liver ROS, GERD  Medicated,  Endo/Other  diabetes, Type 2Obesity   Renal/GU negative Renal ROS     Musculoskeletal  (+) Arthritis , left displaced bimalleolar fracture   Abdominal   Peds  Hematology negative hematology ROS (+)   Anesthesia Other Findings Day of surgery medications reviewed with the patient.  Reproductive/Obstetrics                             Anesthesia Physical Anesthesia Plan  ASA: III  Anesthesia Plan: General   Post-op Pain Management:  Regional for Post-op pain   Induction: Intravenous  PONV Risk Score and Plan: 3 and Ondansetron and Dexamethasone  Airway Management Planned: LMA  Additional Equipment:   Intra-op Plan:   Post-operative Plan: Extubation in OR  Informed Consent: I have reviewed the patients History and Physical, chart, labs and discussed the procedure including the risks, benefits and alternatives for the proposed anesthesia with the patient or authorized representative who has indicated his/her understanding and acceptance.   Dental advisory given  Plan Discussed with: CRNA  Anesthesia Plan Comments:         Anesthesia Quick Evaluation  Anesthesia Physical Anesthesia Plan  ASA: III  Anesthesia Plan: Spinal   Post-op Pain Management:  Regional for Post-op pain   Induction: Intravenous  PONV Risk Score and Plan: 3 and Ondansetron, Propofol infusion and Treatment may vary due to age or medical condition  Airway Management Planned: Natural Airway  Additional Equipment: None  Intra-op Plan:   Post-operative Plan:   Informed Consent: I have reviewed the patients History and Physical, chart, labs and discussed the procedure including the risks, benefits and alternatives for the proposed anesthesia with the patient or authorized representative who has indicated his/her understanding and acceptance.      Dental advisory given  Plan Discussed with: CRNA  Anesthesia Plan Comments: (See PAT note 11/05/18, Konrad Felix, PA-C)      Anesthesia Quick Evaluation

## 2018-11-07 ENCOUNTER — Telehealth: Payer: Self-pay | Admitting: *Deleted

## 2018-11-07 NOTE — Telephone Encounter (Signed)
Pt had pre-op today and her BP was 106/59.  The surgeon suggested she talk to Dr. Andria Frames and see if her metoprolol should be changed to once daily instead of BID. Christen Bame, CMA

## 2018-11-08 ENCOUNTER — Telehealth: Payer: Self-pay | Admitting: *Deleted

## 2018-11-08 DIAGNOSIS — I480 Paroxysmal atrial fibrillation: Secondary | ICD-10-CM

## 2018-11-08 MED ORDER — METOPROLOL TARTRATE 50 MG PO TABS: 25.0000 mg | ORAL_TABLET | Freq: Two times a day (BID) | ORAL | 3 refills | Status: DC

## 2018-11-08 NOTE — Telephone Encounter (Signed)
Called and discussed.  Asymptomatic now.  We are at tail end of cold and flu.  Also concern about COVID 19 but no cases in [facility].  She will likely have the surgery.    Also see previous note aobut low BP.  On both diltiazem and metoprolol for rate control of a fib.  We decided to decrease metoprolol to 25 mg bid.

## 2018-11-08 NOTE — Telephone Encounter (Signed)
Noted and will address next visit.  Needs metoprolol and diltiazem for A fib.

## 2018-11-08 NOTE — Telephone Encounter (Signed)
Patient lm on nurse line and wants to know if she should have her surgery with her "history of congestion and everything that is going on".  She would like Dr. Andria Frames to call her about this. Christen Bame, CMA

## 2018-11-10 MED ORDER — BUPIVACAINE LIPOSOME 1.3 % IJ SUSP
20.0000 mL | Freq: Once | INTRAMUSCULAR | Status: DC
  Filled 2018-11-10: qty 20

## 2018-11-11 ENCOUNTER — Encounter (HOSPITAL_COMMUNITY): Payer: Self-pay | Admitting: *Deleted

## 2018-11-11 ENCOUNTER — Encounter (HOSPITAL_COMMUNITY)
Admission: RE | Disposition: A | Discharge status: Home / Self Care | Payer: Self-pay | Source: Home / Self Care | Attending: Orthopedic Surgery

## 2018-11-11 ENCOUNTER — Ambulatory Visit (HOSPITAL_COMMUNITY)
Admission: RE | Admit: 2018-11-11 | Discharge: 2018-11-11 | Disposition: A | Discharge status: Home / Self Care | Payer: Medicare Other | Attending: Orthopedic Surgery | Admitting: Orthopedic Surgery

## 2018-11-11 DIAGNOSIS — Z9012 Acquired absence of left breast and nipple: Secondary | ICD-10-CM | POA: Insufficient documentation

## 2018-11-11 DIAGNOSIS — E119 Type 2 diabetes mellitus without complications: Secondary | ICD-10-CM | POA: Insufficient documentation

## 2018-11-11 DIAGNOSIS — Z87891 Personal history of nicotine dependence: Secondary | ICD-10-CM | POA: Insufficient documentation

## 2018-11-11 DIAGNOSIS — I69354 Hemiplegia and hemiparesis following cerebral infarction affecting left non-dominant side: Secondary | ICD-10-CM | POA: Insufficient documentation

## 2018-11-11 DIAGNOSIS — I5032 Chronic diastolic (congestive) heart failure: Secondary | ICD-10-CM | POA: Diagnosis not present

## 2018-11-11 DIAGNOSIS — M1712 Unilateral primary osteoarthritis, left knee: Secondary | ICD-10-CM | POA: Insufficient documentation

## 2018-11-11 DIAGNOSIS — I480 Paroxysmal atrial fibrillation: Secondary | ICD-10-CM

## 2018-11-11 DIAGNOSIS — Z96653 Presence of artificial knee joint, bilateral: Secondary | ICD-10-CM | POA: Diagnosis not present

## 2018-11-11 DIAGNOSIS — M25562 Pain in left knee: Secondary | ICD-10-CM | POA: Diagnosis present

## 2018-11-11 DIAGNOSIS — I110 Hypertensive heart disease with heart failure: Secondary | ICD-10-CM | POA: Diagnosis not present

## 2018-11-11 DIAGNOSIS — Z5309 Procedure and treatment not carried out because of other contraindication: Secondary | ICD-10-CM | POA: Insufficient documentation

## 2018-11-11 SURGERY — ARTHROPLASTY, KNEE, TOTAL
Anesthesia: Spinal

## 2018-11-11 MED ORDER — LACTATED RINGERS IV SOLN
INTRAVENOUS | Status: DC
  Administered 2018-11-11: 08:00:00 via INTRAVENOUS

## 2018-11-11 MED ORDER — BUPIVACAINE-EPINEPHRINE (PF) 0.25% -1:200000 IJ SOLN
INTRAMUSCULAR | Status: AC
  Filled 2018-11-11: qty 30

## 2018-11-11 MED ORDER — CHLORHEXIDINE GLUCONATE 4 % EX LIQD: 60.0000 mL | Freq: Once | CUTANEOUS | Status: DC

## 2018-11-11 MED ORDER — PROPOFOL 10 MG/ML IV BOLUS
INTRAVENOUS | Status: AC
  Filled 2018-11-11: qty 40

## 2018-11-11 MED ORDER — FENTANYL CITRATE (PF) 100 MCG/2ML IJ SOLN
50.0000 ug | Freq: Once | INTRAMUSCULAR | Status: DC
  Filled 2018-11-11: qty 2

## 2018-11-11 MED ORDER — TRANEXAMIC ACID-NACL 1000-0.7 MG/100ML-% IV SOLN
1000.0000 mg | INTRAVENOUS | Status: DC
  Filled 2018-11-11: qty 100

## 2018-11-11 MED ORDER — GABAPENTIN 300 MG PO CAPS
300.0000 mg | ORAL_CAPSULE | Freq: Once | ORAL | Status: DC
  Filled 2018-11-11: qty 1

## 2018-11-11 MED ORDER — CEFAZOLIN SODIUM-DEXTROSE 2-4 GM/100ML-% IV SOLN
2.0000 g | INTRAVENOUS | Status: DC
  Filled 2018-11-11: qty 100

## 2018-11-11 MED ORDER — DEXAMETHASONE SODIUM PHOSPHATE 10 MG/ML IJ SOLN: 8.0000 mg | Freq: Once | INTRAMUSCULAR | Status: DC

## 2018-11-11 MED ORDER — MIDAZOLAM HCL 2 MG/2ML IJ SOLN: 1.0000 mg | Freq: Once | INTRAMUSCULAR | Status: DC

## 2018-11-11 MED ORDER — ACETAMINOPHEN 500 MG PO TABS
1000.0000 mg | ORAL_TABLET | Freq: Once | ORAL | Status: AC
  Administered 2018-11-11: 1000 mg via ORAL
  Filled 2018-11-11: qty 2

## 2018-11-11 MED ORDER — SODIUM CHLORIDE (PF) 0.9 % IJ SOLN
INTRAMUSCULAR | Status: AC
  Filled 2018-11-11: qty 20

## 2018-11-11 NOTE — Discharge Instructions (Signed)
Please await a call from your doctor this afternoon. His instructions for now are completely stopping your diltiazem and cutting your dose of metoprolol in half.

## 2018-11-11 NOTE — H&P (Signed)
Karen Jackson MRN:  [id number] DOB/SEX:  1940-09-04/female  CHIEF COMPLAINT:  Painful left Knee  HISTORY: Patient is a 78 y.o. female presented with a history of pain in the left knee. Onset of symptoms was gradual starting a few years ago with gradually worsening course since that time. Patient has been treated conservatively with over-the-counter NSAIDs and activity modification. Patient currently rates pain in the knee at 10 out of 10 with activity. There is pain at night.  PAST MEDICAL HISTORY: Patient Active Problem List   Diagnosis Date Noted  . Closed left ankle fracture 07/20/2018  . Acute ankle pain 05/23/2018  . Respiratory failure with hypoxia ([place]) 08/30/2017  . Hypoxemia   . Heart failure with preserved ejection fraction ([place]), Grade 3 diastolic dysfunction 56/31/4970  . Atrial fibrillation with RVR ([place])   . PAF (paroxysmal atrial fibrillation) ([place])   . Metabolic syndrome 26/37/8588  . Encounter for preventive health examination 02/17/2016  . Morbid obesity ([place]) 06/17/2015  . Hypomagnesemia 04/24/2014  . Hemiparesis affecting left side as late effect of cerebrovascular accident ([place]) 04/24/2014  . Nontraumatic cerebral hemorrhage ([place]) 04/30/2012  . DM (diabetes mellitus) with complications ([place]) 50/27/7412  . OSTEOPENIA 01/21/2009  . UNSPECIFIED VITAMIN D DEFICIENCY 11/19/2007  . ESSENTIAL HYPERTENSION, BENIGN 11/19/2007  . HYPERCHOLESTEROLEMIA 10/25/2006  . GASTROESOPHAGEAL REFLUX, NO ESOPHAGITIS 10/25/2006  . DIVERTICULOSIS OF COLON 10/25/2006  . Osteoarthritis 10/25/2006  . CERVICAL SPINE DISORDER, NOS 10/25/2006   Past Medical History:  Diagnosis Date  . Acute cystitis without hematuria   . Acute diastolic CHF (congestive heart failure) ([place])   . Arthritis   . Dyspnea   . Dysrhythmia   . Fever of unknown origin 03/19/2017  . Hyperlipidemia   . Hypertension    denies at preop  . Multifocal pneumonia   . Neuromuscular disorder ([place])    neuropathy left  arm and foot  . Osteopenia   . Paralysis ([place])    partial left side from CVA   . Persistent atrial fibrillation   . PONV (postoperative nausea and vomiting)   . Pre-diabetes   . Stroke [facility]) 2013   hemmorahgic   Past Surgical History:  Procedure Laterality Date  . ANKLE SURGERY    . APPENDECTOMY    . CHOLECYSTECTOMY    . HERNIA REPAIR     Esophagus  . JOINT REPLACEMENT     total- right partial- left  . MASTECTOMY PARTIAL / LUMPECTOMY  2012   left  . ORIF ANKLE FRACTURE Left 07/20/2018   Procedure: OPEN REDUCTION INTERNAL FIXATION (ORIF) ANKLE FRACTURE;  Surgeon: Wylene Simmer, MD;  Location: [place];  Service: Orthopedics;  Laterality: Left;     MEDICATIONS:   No medications prior to admission.    ALLERGIES:   Allergies  Allergen Reactions  . Codeine Phosphate Nausea And Vomiting  . Simvastatin Other (See Comments)    Myalgia  Changed brand ok now    REVIEW OF SYSTEMS:  A comprehensive review of systems was negative except for: Musculoskeletal: positive for arthralgias and bone pain   FAMILY HISTORY:   Family History  Problem Relation Age of Onset  . Diabetes Mother     SOCIAL HISTORY:   Social History   Tobacco Use  . Smoking status: Former Smoker    Packs/day: 0.50    Years: 23.00    Pack years: 11.50    Types: Cigarettes    Start date: 08/28/1957    Last attempt to quit: 03/09/1981    Years since  quitting: 37.7  . Smokeless tobacco: Never Used  Substance Use Topics  . Alcohol use: Yes    Alcohol/week: 15.0 standard drinks    Types: 1 Glasses of wine, 14 Standard drinks or equivalent per week    Comment: daily one glass     EXAMINATION:  Vital signs in last 24 hours:    There were no vitals taken for this visit.  General Appearance:    Alert, cooperative, no distress, appears stated age  Head:    Normocephalic, without obvious abnormality, atraumatic  Eyes:    PERRL, conjunctiva/corneas clear, EOM's intact, fundi    benign, both eyes  Ears:     Normal TM's and external ear canals, both ears  Nose:   Nares normal, septum midline, mucosa normal, no drainage    or sinus tenderness  Throat:   Lips, mucosa, and tongue normal; teeth and gums normal  Neck:   Supple, symmetrical, trachea midline, no adenopathy;    thyroid:  no enlargement/tenderness/nodules; no carotid   bruit or JVD  Back:     Symmetric, no curvature, ROM normal, no CVA tenderness  Lungs:     Clear to auscultation bilaterally, respirations unlabored  Chest Wall:    No tenderness or deformity   Heart:    Regular rate and rhythm, S1 and S2 normal, no murmur, rub   or gallop  Breast Exam:    No tenderness, masses, or nipple abnormality  Abdomen:     Soft, non-tender, bowel sounds active all four quadrants,    no masses, no organomegaly  Genitalia:    Normal female without lesion, discharge or tenderness  Rectal:    Normal tone, no masses or tenderness;   guaiac negative stool  Extremities:   Extremities normal, atraumatic, no cyanosis or edema  Pulses:   2+ and symmetric all extremities  Skin:   Skin color, texture, turgor normal, no rashes or lesions  Lymph nodes:   Cervical, supraclavicular, and axillary nodes normal  Neurologic:   CNII-XII intact, normal strength, sensation and reflexes    throughout    Musculoskeletal:  ROM 0-120, Ligaments intact,  Imaging Review Plain radiographs demonstrate severe degenerative joint disease of the left knee. The overall alignment is neutral. The bone quality appears to be good for age and reported activity level.  Assessment/Plan: Primary osteoarthritis, left knee   The patient history, physical examination and imaging studies are consistent with advanced degenerative joint disease of the left knee. The patient has failed conservative treatment.  The clearance notes were reviewed.  After discussion with the patient it was felt that Total Knee Replacement was indicated. The procedure,  risks, and benefits of total knee  arthroplasty were presented and reviewed. The risks including but not limited to aseptic loosening, infection, blood clots, vascular injury, stiffness, patella tracking problems complications among others were discussed. The patient acknowledged the explanation, agreed to proceed with the plan.  Preoperative templating of the joint replacement has been completed, documented, and submitted to the Operating Room personnel in order to optimize intra-operative equipment management.    Patient's anticipated LOS is less than 2 midnights, meeting these requirements: - Lives within 1 hour of care - Has a competent adult at home to recover with post-op recover - NO history of  - Chronic pain requiring opiods  - Diabetes  - Coronary Artery Disease  - Heart failure  - Heart attack  - Stroke  - DVT/VTE  - Cardiac arrhythmia  - Respiratory Failure/COPD  - Renal  failure  - Anemia  - Advanced Liver disease       Donia Ast 11/11/2018, 6:23 AM

## 2018-11-11 NOTE — Progress Notes (Addendum)
Upon prepping patient for the nerve block, VS reflected a low heart rate ranging between 30-40. Pt heart rate then increased to mid 50's. Pt denies any symptoms. No dizziness, fatigue, or nausea. EKG taken.  Anesthesia notified     1110: PA made contact with Dr. Andria Frames. Pt case cancelled at this time with pt given instructions to stop diltiazem and cut dose of metoprolol in half.  Pt will await call from her Doctor this afternnon

## 2018-11-12 ENCOUNTER — Telehealth: Payer: Self-pay | Admitting: Family Medicine

## 2018-11-12 NOTE — Telephone Encounter (Signed)
Called from surgery center yesterday.  Elective surgery was not done due to bradycardia.  Patient has generally felt fine and not been aware of palpitations.  No syncope.  Has been running lowish blood pressures.  Based on brady, I told her to stop her diltiazem  Metoprolol continued at 25 mg bid.  Called today - trying to keep out of office due to [place].  Feels fine.  Has finger pulse ox which measured heart rate at 78.    She will measure heart rate and blood pressure multiple times today.  I will call in follow up tomorrow.

## 2018-11-13 NOTE — Telephone Encounter (Signed)
Called again.  She feels fine. BP running 800-634 systolic and 94-94 diastolic.  Pulse in 70s consistently.  Off diltiazem.  Takes 25 mg metoprolol BID.  She will continue to check and notify me if problems. She will make an appointment to see me after the [place] pandemic subsides.

## 2018-11-20 ENCOUNTER — Other Ambulatory Visit: Payer: Self-pay | Admitting: Family Medicine

## 2018-11-22 ENCOUNTER — Ambulatory Visit: Payer: Medicare Other | Admitting: Physical Therapy

## 2018-11-29 ENCOUNTER — Ambulatory Visit: Payer: Medicare Other | Admitting: Physical Therapy

## 2018-12-11 ENCOUNTER — Other Ambulatory Visit: Payer: Self-pay | Admitting: Family Medicine

## 2018-12-11 DIAGNOSIS — K219 Gastro-esophageal reflux disease without esophagitis: Secondary | ICD-10-CM

## 2018-12-18 NOTE — Telephone Encounter (Signed)
Pt calls to speak with PCP about her "up coming surgery." Patient stated she needs note from pcp stating its safe for her to continue with surgery. Please advise.

## 2018-12-23 NOTE — Telephone Encounter (Signed)
Called patient.  Heart rate good off the diltiazem and on metoprolol 25 mg bid.  No change.  I will sign medical clearance when Dr. Jeoffrey Massed office faxes it to me.

## 2019-01-02 ENCOUNTER — Other Ambulatory Visit: Payer: Self-pay | Admitting: Family Medicine

## 2019-01-02 DIAGNOSIS — I5030 Unspecified diastolic (congestive) heart failure: Secondary | ICD-10-CM

## 2019-01-14 ENCOUNTER — Other Ambulatory Visit: Payer: Self-pay | Admitting: Orthopedic Surgery

## 2019-01-22 NOTE — Patient Instructions (Addendum)
Karen Jackson  01/22/2019   Your procedure is scheduled on: 01-27-19    Report to [facility] Main  Entrance    Report to Admitting at 6:55 AM   YOU NEED TO HAVE A COVID 19 TEST ON 01-23-19 @12 :00 PM, THIS TEST MUST BE DONE BEFORE SURGERY, COME TO [place] ENTRANCE BETWEEN THE HOURS OF 900 AM AND 300 PM ON YOUR COVID TEST DATE. AFTER THIS TEST, YOU ARE EXPECTED TO QUARANTINE.   Call this number if you have problems the morning of surgery [phone]    Remember: NO SOLID FOOD AFTER MIDNIGHT THE NIGHT PRIOR TO SURGERY. NOTHING BY MOUTH EXCEPT CLEAR LIQUIDS UNTIL 3 HOURS PRIOR TO SCHEDULED SURGERY. PLEASE FINISH ENSURE DRINK PER SURGEON ORDER 3 HOURS PRIOR TO SCHEDULED SURGERY TIME WHICH NEEDS TO BE COMPLETED AT 4:30 AM.   CLEAR LIQUID DIET   Foods Allowed                                                                     Foods Excluded  Coffee and tea, regular and decaf                             liquids that you cannot  Plain Jell-O in any flavor                                             see through such as: Fruit ices (not with fruit pulp)                                     milk, soups, orange juice  Iced Popsicles                                    All solid food Carbonated beverages, regular and diet                                    Cranberry, grape and apple juices Sports drinks like Gatorade Lightly seasoned clear broth or consume(fat free) Sugar, honey syrup  Sample Menu Breakfast                                Lunch                                     Supper Cranberry juice                    Beef broth  Chicken broth Jell-O                                     Grape juice                           Apple juice Coffee or tea                        Jell-O                                      Popsicle                                                Coffee or tea                        Coffee or  tea  _____________________________________________________________________     BRUSH YOUR TEETH MORNING OF SURGERY AND RINSE YOUR MOUTH OUT, NO CHEWING GUM CANDY OR MINTS.     Take these medicines the morning of surgery with A SIP OF WATER: Gabapentin (Neurontin), Metoprolol Tartrate, and Omeprazole (Prilosec) DO NOT TAKE ANY DIABETIC MEDICATIONS DAY OF YOUR SURGERY                               You may not have any metal on your body including hair pins and              piercings     Do not wear jewelry, make-up, lotions, powders or perfumes, deodorant              Do not wear nail polish.  Do not shave  48 hours prior to surgery.     Do not bring valuables to the hospital. [place].  Contacts, dentures or bridgework may not be worn into surgery.    Special Instructions: N/A              Please read over the following fact sheets you were given: _____________________________________________________________________             [facility] - Preparing for Surgery Before surgery, you can play an important role.  Because skin is not sterile, your skin needs to be as free of germs as possible.  You can reduce the number of germs on your skin by washing with CHG (chlorahexidine gluconate) soap before surgery.  CHG is an antiseptic cleaner which kills germs and bonds with the skin to continue killing germs even after washing. Please DO NOT use if you have an allergy to CHG or antibacterial soaps.  If your skin becomes reddened/irritated stop using the CHG and inform your nurse when you arrive at Short Stay. Do not shave (including legs and underarms) for at least 48 hours prior to the first CHG shower.  You may shave your face/neck. Please follow these instructions carefully:  1.  Shower with CHG Soap the night before surgery and the  morning of Surgery.  2.  If you choose to wash your hair, wash your hair first as usual with your   normal  shampoo.  3.  After you shampoo, rinse your hair and body thoroughly to remove the  shampoo.                           4.  Use CHG as you would any other liquid soap.  You can apply chg directly  to the skin and wash                       Gently with a scrungie or clean washcloth.  5.  Apply the CHG Soap to your body ONLY FROM THE NECK DOWN.   Do not use on face/ open                           Wound or open sores. Avoid contact with eyes, ears mouth and genitals (private parts).                       Wash face,  Genitals (private parts) with your normal soap.             6.  Wash thoroughly, paying special attention to the area where your surgery  will be performed.  7.  Thoroughly rinse your body with warm water from the neck down.  8.  DO NOT shower/wash with your normal soap after using and rinsing off  the CHG Soap.                9.  Pat yourself dry with a clean towel.            10.  Wear clean pajamas.            11.  Place clean sheets on your bed the night of your first shower and do not  sleep with pets. Day of Surgery : Do not apply any lotions/deodorants the morning of surgery.  Please wear clean clothes to the hospital/surgery center.  FAILURE TO FOLLOW THESE INSTRUCTIONS MAY RESULT IN THE CANCELLATION OF YOUR SURGERY PATIENT SIGNATURE_________________________________  NURSE SIGNATURE__________________________________  ________________________________________________________________________   Karen Jackson  An incentive spirometer is a tool that can help keep your lungs clear and active. This tool measures how well you are filling your lungs with each breath. Taking long deep breaths may help reverse or decrease the chance of developing breathing (pulmonary) problems (especially infection) following:  A long period of time when you are unable to move or be active. BEFORE THE PROCEDURE   If the spirometer includes an indicator to show your best effort, your  nurse or respiratory therapist will set it to a desired goal.  If possible, sit up straight or lean slightly forward. Try not to slouch.  Hold the incentive spirometer in an upright position. INSTRUCTIONS FOR USE  1. Sit on the edge of your bed if possible, or sit up as far as you can in bed or on a chair. 2. Hold the incentive spirometer in an upright position. 3. Breathe out normally. 4. Place the mouthpiece in your mouth and seal your lips tightly around it. 5. Breathe in slowly and as deeply as possible, raising the piston or the ball toward the top of the column. 6. Hold your breath for 3-5 seconds or for as long as  possible. Allow the piston or ball to fall to the bottom of the column. 7. Remove the mouthpiece from your mouth and breathe out normally. 8. Rest for a few seconds and repeat Steps 1 through 7 at least 10 times every 1-2 hours when you are awake. Take your time and take a few normal breaths between deep breaths. 9. The spirometer may include an indicator to show your best effort. Use the indicator as a goal to work toward during each repetition. 10. After each set of 10 deep breaths, practice coughing to be sure your lungs are clear. If you have an incision (the cut made at the time of surgery), support your incision when coughing by placing a pillow or rolled up towels firmly against it. Once you are able to get out of bed, walk around indoors and cough well. You may stop using the incentive spirometer when instructed by your caregiver.  RISKS AND COMPLICATIONS  Take your time so you do not get dizzy or light-headed.  If you are in pain, you may need to take or ask for pain medication before doing incentive spirometry. It is harder to take a deep breath if you are having pain. AFTER USE  Rest and breathe slowly and easily.  It can be helpful to keep track of a log of your progress. Your caregiver can provide you with a simple table to help with this. If you are using the  spirometer at home, follow these instructions: [place] IF:   You are having difficultly using the spirometer.  You have trouble using the spirometer as often as instructed.  Your pain medication is not giving enough relief while using the spirometer.  You develop fever of 100.5 F (38.1 C) or higher. SEEK IMMEDIATE MEDICAL CARE IF:   You cough up bloody sputum that had not been present before.  You develop fever of 102 F (38.9 C) or greater.  You develop worsening pain at or near the incision site. MAKE SURE YOU:   Understand these instructions.  Will watch your condition.  Will get help right away if you are not doing well or get worse. Document Released: 12/25/2006 Document Revised: 11/06/2011 Document Reviewed: 02/25/2007 [facility] Patient Information 2014 [place], [place].   ________________________________________________________________________

## 2019-01-22 NOTE — Progress Notes (Signed)
SPOKE W/ PT     SCREENING SYMPTOMS OF COVID 19:   COUGH--No  RUNNY NOSE--- No  SORE THROAT---No  NASAL CONGESTION----No  SNEEZING----No  SHORTNESS OF BREATH---No  DIFFICULTY BREATHING---No  TEMP >100.0 -----No  UNEXPLAINED BODY ACHES------No  CHILLS --------No   HEADACHES ---------No  LOSS OF SMELL/ TASTE --------No    HAVE YOU OR ANY FAMILY MEMBER TRAVELLED PAST 14 DAYS OUT OF THE   COUNTY---No STATE----No COUNTRY----No  HAVE YOU OR ANY FAMILY MEMBER BEEN EXPOSED TO ANYONE WITH COVID 19? No

## 2019-01-23 ENCOUNTER — Encounter (HOSPITAL_COMMUNITY)
Admission: RE | Admit: 2019-01-23 | Discharge: 2019-01-23 | Disposition: A | Discharge status: Home / Self Care | Payer: Medicare Other | Source: Ambulatory Visit | Attending: Orthopedic Surgery | Admitting: Orthopedic Surgery

## 2019-01-23 ENCOUNTER — Other Ambulatory Visit (HOSPITAL_COMMUNITY)
Admission: RE | Admit: 2019-01-23 | Discharge: 2019-01-23 | Disposition: A | Discharge status: Home / Self Care | Payer: Medicare Other | Source: Ambulatory Visit | Attending: Orthopedic Surgery | Admitting: Orthopedic Surgery

## 2019-01-23 ENCOUNTER — Encounter (HOSPITAL_COMMUNITY): Payer: Self-pay

## 2019-01-23 ENCOUNTER — Other Ambulatory Visit: Payer: Self-pay

## 2019-01-23 DIAGNOSIS — Z79899 Other long term (current) drug therapy: Secondary | ICD-10-CM | POA: Insufficient documentation

## 2019-01-23 DIAGNOSIS — E785 Hyperlipidemia, unspecified: Secondary | ICD-10-CM | POA: Insufficient documentation

## 2019-01-23 DIAGNOSIS — Z8673 Personal history of transient ischemic attack (TIA), and cerebral infarction without residual deficits: Secondary | ICD-10-CM | POA: Insufficient documentation

## 2019-01-23 DIAGNOSIS — I509 Heart failure, unspecified: Secondary | ICD-10-CM | POA: Diagnosis not present

## 2019-01-23 DIAGNOSIS — M1712 Unilateral primary osteoarthritis, left knee: Secondary | ICD-10-CM | POA: Insufficient documentation

## 2019-01-23 DIAGNOSIS — Z1159 Encounter for screening for other viral diseases: Secondary | ICD-10-CM | POA: Diagnosis not present

## 2019-01-23 DIAGNOSIS — Z01818 Encounter for other preprocedural examination: Secondary | ICD-10-CM | POA: Insufficient documentation

## 2019-01-23 DIAGNOSIS — I110 Hypertensive heart disease with heart failure: Secondary | ICD-10-CM | POA: Diagnosis not present

## 2019-01-23 DIAGNOSIS — I4891 Unspecified atrial fibrillation: Secondary | ICD-10-CM | POA: Diagnosis not present

## 2019-01-23 DIAGNOSIS — Z87891 Personal history of nicotine dependence: Secondary | ICD-10-CM | POA: Insufficient documentation

## 2019-01-23 DIAGNOSIS — R7303 Prediabetes: Secondary | ICD-10-CM | POA: Insufficient documentation

## 2019-01-23 LAB — CBC WITH DIFFERENTIAL/PLATELET
Abs Immature Granulocytes: 0.02 10*3/uL (ref 0.00–0.07)
Basophils Absolute: 0.1 10*3/uL (ref 0.0–0.1)
Basophils Relative: 1 %
Eosinophils Absolute: 0.2 10*3/uL (ref 0.0–0.5)
Eosinophils Relative: 2 %
HCT: 48.5 % — ABNORMAL HIGH (ref 36.0–46.0)
Hemoglobin: 15.9 g/dL — ABNORMAL HIGH (ref 12.0–15.0)
Immature Granulocytes: 0 %
Lymphocytes Relative: 29 %
Lymphs Abs: 2.2 10*3/uL (ref 0.7–4.0)
MCH: 32.6 pg (ref 26.0–34.0)
MCHC: 32.8 g/dL (ref 30.0–36.0)
MCV: 99.4 fL (ref 80.0–100.0)
Monocytes Absolute: 0.8 10*3/uL (ref 0.1–1.0)
Monocytes Relative: 10 %
Neutro Abs: 4.3 10*3/uL (ref 1.7–7.7)
Neutrophils Relative %: 58 %
Platelets: 378 10*3/uL (ref 150–400)
RBC: 4.88 MIL/uL (ref 3.87–5.11)
RDW: 12.9 % (ref 11.5–15.5)
WBC: 7.5 10*3/uL (ref 4.0–10.5)
nRBC: 0 % (ref 0.0–0.2)

## 2019-01-23 LAB — HEMOGLOBIN A1C
Hgb A1c MFr Bld: 6.7 % — ABNORMAL HIGH (ref 4.8–5.6)
Mean Plasma Glucose: 145.59 mg/dL

## 2019-01-23 LAB — COMPREHENSIVE METABOLIC PANEL
ALT: 15 U/L (ref 0–44)
AST: 24 U/L (ref 15–41)
Albumin: 3.9 g/dL (ref 3.5–5.0)
Alkaline Phosphatase: 66 U/L (ref 38–126)
Anion gap: 8 (ref 5–15)
BUN: 19 mg/dL (ref 8–23)
CO2: 34 mmol/L — ABNORMAL HIGH (ref 22–32)
Calcium: 9.6 mg/dL (ref 8.9–10.3)
Chloride: 96 mmol/L — ABNORMAL LOW (ref 98–111)
Creatinine, Ser: 0.65 mg/dL (ref 0.44–1.00)
GFR calc Af Amer: >60 mL/min (ref >60)
GFR calc non Af Amer: >60 mL/min (ref >60)
Glucose, Bld: 176 mg/dL — ABNORMAL HIGH (ref 70–99)
Potassium: 4.1 mmol/L (ref 3.5–5.1)
Sodium: 138 mmol/L (ref 135–145)
Total Bilirubin: 0.7 mg/dL (ref 0.3–1.2)
Total Protein: 7.3 g/dL (ref 6.5–8.1)

## 2019-01-23 LAB — SURGICAL PCR SCREEN
MRSA, PCR: NEGATIVE
Staphylococcus aureus: NEGATIVE

## 2019-01-23 NOTE — Progress Notes (Signed)
11-12-18 (Epic) Surgical Clearance Dr. Andria Frames  11-11-18 (Epic) EKG  03-21-17 (Epic) ECHO

## 2019-01-24 ENCOUNTER — Other Ambulatory Visit: Payer: Self-pay

## 2019-01-24 LAB — NOVEL CORONAVIRUS, NAA (HOSP ORDER, SEND-OUT TO REF LAB; TAT 18-24 HRS): SARS-CoV-2, NAA: NOT DETECTED

## 2019-01-24 NOTE — Progress Notes (Signed)
Anesthesia Chart Review   Case:  [id number] Date/Time:  01/27/19 0910   Procedure:  TOTAL KNEE ARTHROPLASTY (Left )   Anesthesia type:  Spinal   Pre-op diagnosis:  LT. KNEE OSTEOARHTRITIS   Location:  [place] [facility] / [facility]   Surgeon:  Vickey Huger, MD      DISCUSSION: 78 yo former smoker (11.5 pack years, quit 03/09/81) with h/o PONV, HLD, CHF, pre-diabetes, a-fib, stroke 2013 (left sided weakness), HTN, left knee OA scheduled for above procedure 01/27/19 with Dr. Vickey Huger.   Previously scheduled for above procedure 11/11/2018 which was cancelled due to bradycardia, HR 30-40s.  Per PCP, Dr. Andria Frames, she has since been taken off diltiazem and her metoprolol has been decreased to 25mg  BID.  Heart rate improved.  Per Dr. Andria Frames ok to proceed.  VS: BP (!) 115/59 (BP Location: Right Arm)   Pulse 93   Temp 36.8 C (Oral)   Resp 18   Ht 4\' 11"  (1.499 m)   SpO2 93%   BMI 34.63 kg/m   PROVIDERS: Zenia Resides, MD is PCP    LABS: Labs reviewed: Acceptable for surgery. (all labs ordered are listed, but only abnormal results are displayed)  Labs Reviewed  CBC WITH DIFFERENTIAL/PLATELET - Abnormal; Notable for the following components:      Result Value   Hemoglobin 15.9 (*)    HCT 48.5 (*)    All other components within normal limits  COMPREHENSIVE METABOLIC PANEL - Abnormal; Notable for the following components:   Chloride 96 (*)    CO2 34 (*)    Glucose, Bld 176 (*)    All other components within normal limits  HEMOGLOBIN A1C - Abnormal; Notable for the following components:   Hgb A1c MFr Bld 6.7 (*)    All other components within normal limits  SURGICAL PCR SCREEN     IMAGES:   EKG: 11/11/2018 Rate 41  Sinus bradycardia with 2nd degree A-V block ([place] II) Abnormal ECG since last tracing the rate has slowed and sinus node dysfunction and the rate is slower  CV:  Echo 03/21/17 Study Conclusions  - Left ventricle: The cavity size was normal. Systolic function  was normal. The estimated ejection fraction was in the range of 60% to 65%. Wall motion was normal; there were no regional wall motion abnormalities. There was a reduced contribution of atrial contraction to ventricular filling, due to increased ventricular diastolic pressure or atrial contractile dysfunction. Doppler parameters are consistent with a reversible restrictive pattern, indicative of decreased left ventricular diastolic compliance and/or increased left atrial pressure (grade 3 diastolic dysfunction). Doppler parameters are consistent with high ventricular filling pressure. - Aortic valve: Poorly visualized. - Mitral valve: Calcified annulus. There was mild regurgitation. - Left atrium: The atrium was mildly to moderately dilated. Anterior-posterior dimension: 48 mm. - Right atrium: The atrium was mildly dilated. - Pulmonic valve: Poorly visualized. - Pulmonary arteries: Systolic pressure could not be accurately estimated.:  Past Medical History:  Diagnosis Date  . Acute cystitis without hematuria   . Acute diastolic CHF (congestive heart failure) ([place])   . Arthritis   . Dyspnea   . Dysrhythmia   . Fever of unknown origin 03/19/2017  . Hyperlipidemia   . Hypertension    denies at preop  . Multifocal pneumonia   . Neuromuscular disorder ([place])    neuropathy left arm and foot  . Osteopenia   . Paralysis ([place])    partial left side from CVA   .  Persistent atrial fibrillation   . PONV (postoperative nausea and vomiting)   . Pre-diabetes   . Stroke [facility]) 2013   hemmorahgic    Past Surgical History:  Procedure Laterality Date  . ANKLE SURGERY    . APPENDECTOMY    . CHOLECYSTECTOMY    . HERNIA REPAIR     Esophagus  . JOINT REPLACEMENT     total- right partial- left  . MASTECTOMY PARTIAL / LUMPECTOMY  2012   left  . ORIF ANKLE FRACTURE Left 07/20/2018   Procedure: OPEN REDUCTION INTERNAL FIXATION (ORIF) ANKLE FRACTURE;  Surgeon: Wylene Simmer, MD;  Location: [facility];  Service: Orthopedics;  Laterality: Left;    MEDICATIONS: . atorvastatin (LIPITOR) 20 MG tablet  . Calcium Carb-Cholecalciferol (CALCIUM CARBONATE-VITAMIN D3) 600-400 MG-UNIT TABS  . celecoxib (CELEBREX) 200 MG capsule  . docusate sodium (COLACE) 100 MG capsule  . furosemide (LASIX) 80 MG tablet  . gabapentin (NEURONTIN) 100 MG capsule  . MAGNESIUM-OXIDE 400 (241.3 Mg) MG tablet  . metoprolol tartrate (LOPRESSOR) 50 MG tablet  . Multiple Vitamins-Minerals (HAIR/SKIN/NAILS) CAPS  . omeprazole (PRILOSEC) 20 MG capsule  . polyethylene glycol (MIRALAX / GLYCOLAX) packet  . senna (SENOKOT) 8.6 MG TABS tablet  . traZODone (DESYREL) 50 MG tablet  . TURMERIC PO   No current facility-administered medications for this encounter.    Maia Plan [facility] Pre-Surgical Testing [phone] 01/24/19 12:51 PM

## 2019-01-24 NOTE — Anesthesia Preprocedure Evaluation (Addendum)
Anesthesia Evaluation  Patient identified by MRN, date of birth, ID band Patient awake    Reviewed: Allergy & Precautions, NPO status , Patient's Chart, lab work & pertinent test results  History of Anesthesia Complications (+) PONV and history of anesthetic complications  Airway Mallampati: II  TM Distance: >3 FB Neck ROM: Full    Dental  (+) Teeth Intact, Dental Advisory Given   Pulmonary former smoker,    Pulmonary exam normal breath sounds clear to auscultation       Cardiovascular hypertension, Pt. on home beta blockers and Pt. on medications +CHF  Normal cardiovascular exam+ dysrhythmias Atrial Fibrillation  Rhythm:Regular Rate:Normal  EKG: 11/11/2018 Rate 41  Sinus bradycardia with 2nd degree A-V block (Mobitz II) Abnormal ECG since last tracing the rate has slowed and sinus node dysfunction and the rate is slower  CV:  Echo 03/21/17 Study Conclusions  - Left ventricle: The cavity size was normal. Systolic function wasnormal. The estimated ejection fraction was in the range of 60%to 65%. Wall motion was normal; there were no regional wallmotion abnormalities. There was a reduced contribution of atrialcontraction to ventricular filling, due to increased ventriculardiastolic pressure or atrial contractile dysfunction. Doppler parameters are consistent with a reversible restrictive pattern,indicative of decreased left ventricular diastolic complianceand/or increased left atrial pressure (grade 3 diastolicdysfunction). Doppler parameters are consistent with highventricular filling pressure. - Aortic valve: Poorly visualized. - Mitral valve: Calcified annulus. There was mild regurgitation. - Left atrium: The atrium was mildly to moderately dilated. Anterior-posterior dimension: 48 mm. - Right atrium: The atrium was mildly dilated. - Pulmonic valve: Poorly visualized. - Pulmonary arteries: Systolic pressure could  not be accuratelyestimated.   Neuro/Psych  Neuromuscular disease CVA, Residual Symptoms negative psych ROS   GI/Hepatic Neg liver ROS, GERD  Medicated,  Endo/Other  diabetesObesity   Renal/GU negative Renal ROS     Musculoskeletal  (+) Arthritis , Osteoarthritis,    Abdominal   Peds  Hematology negative hematology ROS (+) Plt 378k   Anesthesia Other Findings Day of surgery medications reviewed with the patient.  Reproductive/Obstetrics                                                            Anesthesia Evaluation  Patient identified by MRN, date of birth, ID band Patient awake    Reviewed: Allergy & Precautions, NPO status , Patient's Chart, lab work & pertinent test results, reviewed documented beta blocker date and time   Airway Mallampati: II  TM Distance: >3 FB Neck ROM: Full    Dental  (+) Teeth Intact, Dental Advisory Given   Pulmonary former smoker,    Pulmonary exam normal breath sounds clear to auscultation       Cardiovascular hypertension, Pt. on home beta blockers and Pt. on medications +CHF  + dysrhythmias Atrial Fibrillation  Rhythm:Irregular Rate:Normal  Echo 03/21/17: Study Conclusions  - Left ventricle: The cavity size was normal. Systolic function was normal. The estimated ejection fraction was in the range of 60% to 65%. Wall motion was normal; there were no regional wall motion abnormalities. There was a reduced contribution of atrial contraction to ventricular filling, due to increased ventricular diastolic pressure or atrial contractile dysfunction. Doppler   parameters are consistent with a reversible restrictive pattern, indicative of decreased left ventricular diastolic compliance and/or  increased left atrial pressure (grade 3 diastolic dysfunction). Doppler parameters are consistent with high ventricular filling pressure. - Aortic valve: Poorly visualized. - Mitral valve: Calcified annulus. There was  mild regurgitation. - Left atrium: The atrium was mildly to moderately dilated.   Anterior-posterior dimension: 48 mm. - Right atrium: The atrium was mildly dilated. - Pulmonic valve: Poorly visualized. - Pulmonary arteries: Systolic pressure could not be accurately estimated.   Neuro/Psych CVA    GI/Hepatic Neg liver ROS, GERD  Medicated,  Endo/Other  diabetes, Type 2Obesity   Renal/GU negative Renal ROS     Musculoskeletal  (+) Arthritis , left displaced bimalleolar fracture   Abdominal   Peds  Hematology negative hematology ROS (+)   Anesthesia Other Findings Day of surgery medications reviewed with the patient.  Reproductive/Obstetrics                             Anesthesia Physical Anesthesia Plan  ASA: III  Anesthesia Plan: General   Post-op Pain Management:  Regional for Post-op pain   Induction: Intravenous  PONV Risk Score and Plan: 3 and Ondansetron and Dexamethasone  Airway Management Planned: LMA  Additional Equipment:   Intra-op Plan:   Post-operative Plan: Extubation in OR  Informed Consent: I have reviewed the patients History and Physical, chart, labs and discussed the procedure including the risks, benefits and alternatives for the proposed anesthesia with the patient or authorized representative who has indicated his/her understanding and acceptance.   Dental advisory given  Plan Discussed with: CRNA  Anesthesia Plan Comments:         Anesthesia Quick Evaluation  Anesthesia Physical Anesthesia Plan  ASA: III  Anesthesia Plan: Spinal   Post-op Pain Management:  Regional for Post-op pain   Induction:   PONV Risk Score and Plan: 3 and Propofol infusion and Treatment may vary due to age or medical condition  Airway Management Planned: Natural Airway and Nasal Cannula  Additional Equipment:   Intra-op Plan:   Post-operative Plan:   Informed Consent: I have reviewed the patients History and  Physical, chart, labs and discussed the procedure including the risks, benefits and alternatives for the proposed anesthesia with the patient or authorized representative who has indicated his/her understanding and acceptance.     Dental advisory given  Plan Discussed with: CRNA, Anesthesiologist and Surgeon  Anesthesia Plan Comments:        Anesthesia Quick Evaluation

## 2019-01-24 NOTE — Progress Notes (Signed)
SPOKE W/  [facility] Records     SCREENING SYMPTOMS OF COVID 19:   COUGH--no  RUNNY NOSE--- no  SORE THROAT---no  NASAL CONGESTION----no  SNEEZING----no  SHORTNESS OF BREATH---no  DIFFICULTY BREATHING---no  TEMP >100.0 -----no  UNEXPLAINED BODY ACHES------no  CHILLS -------- no  HEADACHES ---------no  LOSS OF SMELL/ TASTE --------no    HAVE YOU OR ANY FAMILY MEMBER TRAVELLED PAST 14 DAYS OUT OF THE   COUNTY---no STATE----no COUNTRY----no  HAVE YOU OR ANY FAMILY MEMBER BEEN EXPOSED TO ANYONE WITH COVID 19? no

## 2019-01-26 MED ORDER — BUPIVACAINE LIPOSOME 1.3 % IJ SUSP
20.0000 mL | INTRAMUSCULAR | Status: DC
  Filled 2019-01-26: qty 20

## 2019-01-27 ENCOUNTER — Encounter (HOSPITAL_COMMUNITY): Payer: Self-pay

## 2019-01-27 ENCOUNTER — Encounter (HOSPITAL_COMMUNITY)
Admission: RE | Disposition: A | Discharge status: Home Health | Payer: Self-pay | Source: Home / Self Care | Attending: Orthopedic Surgery

## 2019-01-27 ENCOUNTER — Other Ambulatory Visit: Payer: Self-pay

## 2019-01-27 ENCOUNTER — Ambulatory Visit (HOSPITAL_COMMUNITY): Payer: Medicare Other | Admitting: Physician Assistant

## 2019-01-27 ENCOUNTER — Inpatient Hospital Stay (HOSPITAL_COMMUNITY)
Admission: RE | Admit: 2019-01-27 | Discharge: 2019-01-29 | DRG: 470 | Disposition: A | Discharge status: Home Health | Payer: Medicare Other | Attending: Orthopedic Surgery | Admitting: Orthopedic Surgery

## 2019-01-27 ENCOUNTER — Ambulatory Visit (HOSPITAL_COMMUNITY): Payer: Medicare Other | Admitting: Anesthesiology

## 2019-01-27 DIAGNOSIS — Z7982 Long term (current) use of aspirin: Secondary | ICD-10-CM

## 2019-01-27 DIAGNOSIS — Z9049 Acquired absence of other specified parts of digestive tract: Secondary | ICD-10-CM | POA: Diagnosis not present

## 2019-01-27 DIAGNOSIS — E119 Type 2 diabetes mellitus without complications: Secondary | ICD-10-CM | POA: Diagnosis present

## 2019-01-27 DIAGNOSIS — I4819 Other persistent atrial fibrillation: Secondary | ICD-10-CM | POA: Diagnosis not present

## 2019-01-27 DIAGNOSIS — M1712 Unilateral primary osteoarthritis, left knee: Principal | ICD-10-CM | POA: Diagnosis present

## 2019-01-27 DIAGNOSIS — I110 Hypertensive heart disease with heart failure: Secondary | ICD-10-CM | POA: Diagnosis present

## 2019-01-27 DIAGNOSIS — Z6835 Body mass index (BMI) 35.0-35.9, adult: Secondary | ICD-10-CM

## 2019-01-27 DIAGNOSIS — T84093A Other mechanical complication of internal left knee prosthesis, initial encounter: Secondary | ICD-10-CM | POA: Diagnosis not present

## 2019-01-27 DIAGNOSIS — Z87891 Personal history of nicotine dependence: Secondary | ICD-10-CM

## 2019-01-27 DIAGNOSIS — Z885 Allergy status to narcotic agent status: Secondary | ICD-10-CM

## 2019-01-27 DIAGNOSIS — T84023A Instability of internal left knee prosthesis, initial encounter: Secondary | ICD-10-CM | POA: Diagnosis not present

## 2019-01-27 DIAGNOSIS — M8580 Other specified disorders of bone density and structure, unspecified site: Secondary | ICD-10-CM | POA: Diagnosis present

## 2019-01-27 DIAGNOSIS — I69354 Hemiplegia and hemiparesis following cerebral infarction affecting left non-dominant side: Secondary | ICD-10-CM

## 2019-01-27 DIAGNOSIS — Z96659 Presence of unspecified artificial knee joint: Secondary | ICD-10-CM

## 2019-01-27 DIAGNOSIS — Z888 Allergy status to other drugs, medicaments and biological substances status: Secondary | ICD-10-CM

## 2019-01-27 DIAGNOSIS — I5032 Chronic diastolic (congestive) heart failure: Secondary | ICD-10-CM | POA: Diagnosis present

## 2019-01-27 DIAGNOSIS — G8918 Other acute postprocedural pain: Secondary | ICD-10-CM | POA: Diagnosis not present

## 2019-01-27 DIAGNOSIS — Z9012 Acquired absence of left breast and nipple: Secondary | ICD-10-CM | POA: Diagnosis not present

## 2019-01-27 DIAGNOSIS — K219 Gastro-esophageal reflux disease without esophagitis: Secondary | ICD-10-CM | POA: Diagnosis not present

## 2019-01-27 DIAGNOSIS — Z79899 Other long term (current) drug therapy: Secondary | ICD-10-CM

## 2019-01-27 DIAGNOSIS — I5030 Unspecified diastolic (congestive) heart failure: Secondary | ICD-10-CM | POA: Diagnosis not present

## 2019-01-27 DIAGNOSIS — Z96652 Presence of left artificial knee joint: Secondary | ICD-10-CM | POA: Diagnosis not present

## 2019-01-27 HISTORY — PX: TOTAL KNEE ARTHROPLASTY: SHX125

## 2019-01-27 LAB — GLUCOSE, CAPILLARY: Glucose-Capillary: 162 mg/dL — ABNORMAL HIGH (ref 70–99)

## 2019-01-27 SURGERY — ARTHROPLASTY, KNEE, TOTAL
Anesthesia: Spinal | Site: Knee | Laterality: Left

## 2019-01-27 MED ORDER — CEFAZOLIN SODIUM-DEXTROSE 2-4 GM/100ML-% IV SOLN
2.0000 g | INTRAVENOUS | Status: AC
  Administered 2019-01-27: 2 g via INTRAVENOUS
  Filled 2019-01-27: qty 100

## 2019-01-27 MED ORDER — SENNOSIDES-DOCUSATE SODIUM 8.6-50 MG PO TABS: 1.0000 | ORAL_TABLET | Freq: Every evening | ORAL | Status: DC | PRN

## 2019-01-27 MED ORDER — ACETAMINOPHEN 500 MG PO TABS
1000.0000 mg | ORAL_TABLET | Freq: Four times a day (QID) | ORAL | Status: AC
  Administered 2019-01-27 – 2019-01-28 (×4): 1000 mg via ORAL
  Filled 2019-01-27 (×5): qty 2

## 2019-01-27 MED ORDER — TRANEXAMIC ACID-NACL 1000-0.7 MG/100ML-% IV SOLN
1000.0000 mg | Freq: Once | INTRAVENOUS | Status: AC
  Administered 2019-01-27: 15:00:00 1000 mg via INTRAVENOUS
  Filled 2019-01-27: qty 100

## 2019-01-27 MED ORDER — HYDROMORPHONE HCL 1 MG/ML IJ SOLN: 0.5000 mg | INTRAMUSCULAR | Status: DC | PRN

## 2019-01-27 MED ORDER — ESMOLOL HCL 100 MG/10ML IV SOLN
INTRAVENOUS | Status: DC | PRN
  Administered 2019-01-27 (×4): 5 mg via INTRAVENOUS

## 2019-01-27 MED ORDER — DEXAMETHASONE SODIUM PHOSPHATE 4 MG/ML IJ SOLN
INTRAMUSCULAR | Status: DC | PRN
  Administered 2019-01-27: 4 mg via INTRAVENOUS

## 2019-01-27 MED ORDER — ONDANSETRON HCL 4 MG/2ML IJ SOLN
4.0000 mg | Freq: Four times a day (QID) | INTRAMUSCULAR | Status: DC | PRN
  Filled 2019-01-27: qty 2

## 2019-01-27 MED ORDER — CHLORHEXIDINE GLUCONATE 4 % EX LIQD: 60.0000 mL | Freq: Once | CUTANEOUS | Status: DC

## 2019-01-27 MED ORDER — ACETAMINOPHEN 500 MG PO TABS
1000.0000 mg | ORAL_TABLET | Freq: Once | ORAL | Status: AC
  Administered 2019-01-27: 1000 mg via ORAL
  Filled 2019-01-27: qty 2

## 2019-01-27 MED ORDER — ESMOLOL HCL 100 MG/10ML IV SOLN
INTRAVENOUS | Status: AC
  Filled 2019-01-27: qty 10

## 2019-01-27 MED ORDER — GABAPENTIN 100 MG PO CAPS: 100.0000 mg | ORAL_CAPSULE | Freq: Three times a day (TID) | ORAL | Status: DC

## 2019-01-27 MED ORDER — FENTANYL CITRATE (PF) 100 MCG/2ML IJ SOLN
INTRAMUSCULAR | Status: AC
  Filled 2019-01-27: qty 2

## 2019-01-27 MED ORDER — ALUM & MAG HYDROXIDE-SIMETH 200-200-20 MG/5ML PO SUSP: 30.0000 mL | ORAL | Status: DC | PRN

## 2019-01-27 MED ORDER — PHENOL 1.4 % MT LIQD: 1.0000 | OROMUCOSAL | Status: DC | PRN

## 2019-01-27 MED ORDER — FUROSEMIDE 40 MG PO TABS
80.0000 mg | ORAL_TABLET | Freq: Every day | ORAL | Status: DC
  Administered 2019-01-28: 80 mg via ORAL
  Filled 2019-01-27 (×2): qty 2

## 2019-01-27 MED ORDER — SODIUM CHLORIDE (PF) 0.9 % IJ SOLN
INTRAMUSCULAR | Status: AC
  Filled 2019-01-27: qty 20

## 2019-01-27 MED ORDER — METOPROLOL TARTRATE 25 MG PO TABS
25.0000 mg | ORAL_TABLET | Freq: Two times a day (BID) | ORAL | Status: DC
  Administered 2019-01-27 – 2019-01-29 (×4): 25 mg via ORAL
  Filled 2019-01-27 (×4): qty 1

## 2019-01-27 MED ORDER — CELECOXIB 200 MG PO CAPS
400.0000 mg | ORAL_CAPSULE | Freq: Once | ORAL | Status: AC
  Administered 2019-01-27: 400 mg via ORAL
  Filled 2019-01-27: qty 2

## 2019-01-27 MED ORDER — BUPIVACAINE-EPINEPHRINE 0.5% -1:200000 IJ SOLN
INTRAMUSCULAR | Status: DC | PRN
  Administered 2019-01-27: 30 mL

## 2019-01-27 MED ORDER — TRANEXAMIC ACID-NACL 1000-0.7 MG/100ML-% IV SOLN
1000.0000 mg | INTRAVENOUS | Status: AC
  Administered 2019-01-27: 1000 mg via INTRAVENOUS
  Filled 2019-01-27: qty 100

## 2019-01-27 MED ORDER — ROPIVACAINE HCL 7.5 MG/ML IJ SOLN
INTRAMUSCULAR | Status: DC | PRN
  Administered 2019-01-27: 20 mL via PERINEURAL

## 2019-01-27 MED ORDER — ZOLPIDEM TARTRATE 5 MG PO TABS: 5.0000 mg | ORAL_TABLET | Freq: Every evening | ORAL | Status: DC | PRN

## 2019-01-27 MED ORDER — PANTOPRAZOLE SODIUM 40 MG PO TBEC
40.0000 mg | DELAYED_RELEASE_TABLET | Freq: Every day | ORAL | Status: DC
  Administered 2019-01-27 – 2019-01-29 (×3): 40 mg via ORAL
  Filled 2019-01-27 (×3): qty 1

## 2019-01-27 MED ORDER — METHOCARBAMOL 500 MG PO TABS
500.0000 mg | ORAL_TABLET | Freq: Four times a day (QID) | ORAL | Status: DC | PRN
  Administered 2019-01-29: 10:00:00 500 mg via ORAL
  Filled 2019-01-27 (×2): qty 1

## 2019-01-27 MED ORDER — BISACODYL 5 MG PO TBEC: 5.0000 mg | DELAYED_RELEASE_TABLET | Freq: Every day | ORAL | Status: DC | PRN

## 2019-01-27 MED ORDER — MIDAZOLAM HCL 2 MG/2ML IJ SOLN
1.0000 mg | INTRAMUSCULAR | Status: DC
  Administered 2019-01-27: 09:00:00 1 mg via INTRAVENOUS

## 2019-01-27 MED ORDER — PROPOFOL 500 MG/50ML IV EMUL
INTRAVENOUS | Status: DC | PRN
  Administered 2019-01-27: 50 ug/kg/min via INTRAVENOUS

## 2019-01-27 MED ORDER — FLEET ENEMA 7-19 GM/118ML RE ENEM: 1.0000 | ENEMA | Freq: Once | RECTAL | Status: DC | PRN

## 2019-01-27 MED ORDER — GABAPENTIN 300 MG PO CAPS
300.0000 mg | ORAL_CAPSULE | Freq: Three times a day (TID) | ORAL | Status: DC
  Administered 2019-01-27 – 2019-01-29 (×5): 300 mg via ORAL
  Filled 2019-01-27 (×5): qty 1

## 2019-01-27 MED ORDER — SODIUM CHLORIDE 0.9% FLUSH
INTRAVENOUS | Status: DC | PRN
  Administered 2019-01-27: 20 mL

## 2019-01-27 MED ORDER — ATORVASTATIN CALCIUM 20 MG PO TABS
20.0000 mg | ORAL_TABLET | Freq: Every day | ORAL | Status: DC
  Administered 2019-01-27 – 2019-01-28 (×2): 20 mg via ORAL
  Filled 2019-01-27 (×3): qty 1

## 2019-01-27 MED ORDER — FENTANYL CITRATE (PF) 100 MCG/2ML IJ SOLN
50.0000 ug | INTRAMUSCULAR | Status: DC
  Administered 2019-01-27: 50 ug via INTRAVENOUS

## 2019-01-27 MED ORDER — SODIUM CHLORIDE 0.9 % IV SOLN
INTRAVENOUS | Status: DC
  Administered 2019-01-27: 15:00:00 via INTRAVENOUS

## 2019-01-27 MED ORDER — DOCUSATE SODIUM 100 MG PO CAPS
100.0000 mg | ORAL_CAPSULE | Freq: Two times a day (BID) | ORAL | Status: DC
  Administered 2019-01-27 – 2019-01-29 (×4): 100 mg via ORAL
  Filled 2019-01-27 (×4): qty 1

## 2019-01-27 MED ORDER — METHOCARBAMOL 1000 MG/10ML IJ SOLN
500.0000 mg | Freq: Four times a day (QID) | INTRAVENOUS | Status: DC | PRN
  Filled 2019-01-27: qty 5

## 2019-01-27 MED ORDER — METOCLOPRAMIDE HCL 5 MG/ML IJ SOLN: 5.0000 mg | Freq: Three times a day (TID) | INTRAMUSCULAR | Status: DC | PRN

## 2019-01-27 MED ORDER — TRAZODONE HCL 50 MG PO TABS
50.0000 mg | ORAL_TABLET | Freq: Every day | ORAL | Status: DC
  Administered 2019-01-27 – 2019-01-28 (×2): 50 mg via ORAL
  Filled 2019-01-27 (×2): qty 1

## 2019-01-27 MED ORDER — MIDAZOLAM HCL 2 MG/2ML IJ SOLN
INTRAMUSCULAR | Status: AC
  Filled 2019-01-27: qty 2

## 2019-01-27 MED ORDER — PHENYLEPHRINE HCL (PRESSORS) 10 MG/ML IV SOLN
INTRAVENOUS | Status: AC
  Filled 2019-01-27: qty 1

## 2019-01-27 MED ORDER — MENTHOL 3 MG MT LOZG: 1.0000 | LOZENGE | OROMUCOSAL | Status: DC | PRN

## 2019-01-27 MED ORDER — PROPOFOL 10 MG/ML IV BOLUS
INTRAVENOUS | Status: DC | PRN
  Administered 2019-01-27: 10 mg via INTRAVENOUS

## 2019-01-27 MED ORDER — OXYCODONE HCL 5 MG PO TABS: 5.0000 mg | ORAL_TABLET | ORAL | Status: DC | PRN

## 2019-01-27 MED ORDER — GABAPENTIN 300 MG PO CAPS
300.0000 mg | ORAL_CAPSULE | Freq: Once | ORAL | Status: AC
  Administered 2019-01-27: 300 mg via ORAL
  Filled 2019-01-27: qty 1

## 2019-01-27 MED ORDER — DIPHENHYDRAMINE HCL 12.5 MG/5ML PO ELIX: 12.5000 mg | ORAL_SOLUTION | ORAL | Status: DC | PRN

## 2019-01-27 MED ORDER — PROPOFOL 10 MG/ML IV BOLUS
INTRAVENOUS | Status: AC
  Filled 2019-01-27: qty 20

## 2019-01-27 MED ORDER — ASPIRIN EC 325 MG PO TBEC
325.0000 mg | DELAYED_RELEASE_TABLET | Freq: Two times a day (BID) | ORAL | Status: DC
  Administered 2019-01-28 – 2019-01-29 (×3): 325 mg via ORAL
  Filled 2019-01-27 (×3): qty 1

## 2019-01-27 MED ORDER — DEXAMETHASONE SODIUM PHOSPHATE 10 MG/ML IJ SOLN: 8.0000 mg | Freq: Once | INTRAMUSCULAR | Status: DC

## 2019-01-27 MED ORDER — SODIUM CHLORIDE 0.9 % IV SOLN
INTRAVENOUS | Status: DC | PRN
  Administered 2019-01-27: 25 ug/min via INTRAVENOUS

## 2019-01-27 MED ORDER — TRAMADOL HCL 50 MG PO TABS
50.0000 mg | ORAL_TABLET | Freq: Four times a day (QID) | ORAL | Status: DC
  Administered 2019-01-27 – 2019-01-29 (×5): 50 mg via ORAL
  Filled 2019-01-27 (×5): qty 1

## 2019-01-27 MED ORDER — POVIDONE-IODINE 10 % EX SWAB
2.0000 "application " | Freq: Once | CUTANEOUS | Status: AC
  Administered 2019-01-27: 2 via TOPICAL

## 2019-01-27 MED ORDER — BUPIVACAINE-EPINEPHRINE (PF) 0.25% -1:200000 IJ SOLN
INTRAMUSCULAR | Status: AC
  Filled 2019-01-27: qty 30

## 2019-01-27 MED ORDER — DEXAMETHASONE SODIUM PHOSPHATE 10 MG/ML IJ SOLN
10.0000 mg | Freq: Once | INTRAMUSCULAR | Status: AC
  Administered 2019-01-28: 10 mg via INTRAVENOUS
  Filled 2019-01-27: qty 1

## 2019-01-27 MED ORDER — ONDANSETRON HCL 4 MG/2ML IJ SOLN
INTRAMUSCULAR | Status: DC | PRN
  Administered 2019-01-27: 4 mg via INTRAVENOUS

## 2019-01-27 MED ORDER — LACTATED RINGERS IV SOLN
INTRAVENOUS | Status: DC
  Administered 2019-01-27: 08:00:00 via INTRAVENOUS

## 2019-01-27 MED ORDER — BUPIVACAINE IN DEXTROSE 0.75-8.25 % IT SOLN
INTRATHECAL | Status: DC | PRN
  Administered 2019-01-27: 1.4 mL via INTRATHECAL

## 2019-01-27 MED ORDER — ONDANSETRON HCL 4 MG PO TABS
4.0000 mg | ORAL_TABLET | Freq: Four times a day (QID) | ORAL | Status: DC | PRN
  Filled 2019-01-27: qty 1

## 2019-01-27 MED ORDER — FERROUS SULFATE 325 (65 FE) MG PO TABS
325.0000 mg | ORAL_TABLET | Freq: Three times a day (TID) | ORAL | Status: DC
  Administered 2019-01-27 – 2019-01-29 (×2): 325 mg via ORAL
  Filled 2019-01-27 (×3): qty 1

## 2019-01-27 MED ORDER — METOCLOPRAMIDE HCL 5 MG PO TABS: 5.0000 mg | ORAL_TABLET | Freq: Three times a day (TID) | ORAL | Status: DC | PRN

## 2019-01-27 MED ORDER — STERILE WATER FOR IRRIGATION IR SOLN
Status: DC | PRN
  Administered 2019-01-27: 2000 mL

## 2019-01-27 MED ORDER — 0.9 % SODIUM CHLORIDE (POUR BTL) OPTIME
TOPICAL | Status: DC | PRN
  Administered 2019-01-27: 1000 mL

## 2019-01-27 MED ORDER — SODIUM CHLORIDE 0.9 % IR SOLN
Status: DC | PRN
  Administered 2019-01-27: 1000 mL

## 2019-01-27 MED ORDER — BUPIVACAINE LIPOSOME 1.3 % IJ SUSP
INTRAMUSCULAR | Status: DC | PRN
  Administered 2019-01-27: 20 mL

## 2019-01-27 MED ORDER — CEFAZOLIN SODIUM-DEXTROSE 1-4 GM/50ML-% IV SOLN
1.0000 g | Freq: Four times a day (QID) | INTRAVENOUS | Status: AC
  Administered 2019-01-27 (×2): 1 g via INTRAVENOUS
  Filled 2019-01-27 (×2): qty 50

## 2019-01-27 MED ORDER — MAGNESIUM OXIDE 400 (241.3 MG) MG PO TABS
400.0000 mg | ORAL_TABLET | Freq: Every day | ORAL | Status: DC
  Administered 2019-01-28 – 2019-01-29 (×2): 400 mg via ORAL
  Filled 2019-01-27 (×2): qty 1

## 2019-01-27 SURGICAL SUPPLY — 66 items
ARTISURF 13M PLY L 3-5CD KNEE (Knees) ×3 IMPLANT
BAG SPEC THK2 15X12 ZIP CLS (MISCELLANEOUS) ×1
BAG ZIPLOCK 12X15 (MISCELLANEOUS) ×3 IMPLANT
BANDAGE ACE 6X5 VEL STRL LF (GAUZE/BANDAGES/DRESSINGS) ×3 IMPLANT
BLADE SAGITTAL 13X1.27X60 (BLADE) ×2 IMPLANT
BLADE SAGITTAL 13X1.27X60MM (BLADE) ×1
BLADE SAW SGTL 83.5X18.5 (BLADE) ×3 IMPLANT
BLADE SURG 15 STRL LF DISP TIS (BLADE) ×1 IMPLANT
BLADE SURG 15 STRL SS (BLADE) ×3
BLADE SURG SZ10 CARB STEEL (BLADE) ×6 IMPLANT
BNDG CMPR MED 10X6 ELC LF (GAUZE/BANDAGES/DRESSINGS) ×1
BNDG ELASTIC 6X10 VLCR STRL LF (GAUZE/BANDAGES/DRESSINGS) ×3 IMPLANT
BOWL SMART MIX CTS (DISPOSABLE) ×3 IMPLANT
BSPLAT TIB 5D C CMNT STM LT (Knees) ×1 IMPLANT
CEMENT BONE SIMPLEX SPEEDSET (Cement) ×6 IMPLANT
CLOSURE WOUND 1/2 X4 (GAUZE/BANDAGES/DRESSINGS) ×2
COMP FEM PERSONA SZ5 LT (Joint) ×3 IMPLANT
COMPONENT FEM PERSONA SZ5 LT (Joint) IMPLANT
COVER SURGICAL LIGHT HANDLE (MISCELLANEOUS) ×3 IMPLANT
COVER WAND RF STERILE (DRAPES) IMPLANT
CUFF TOURN SGL QUICK 34 (TOURNIQUET CUFF) ×3
CUFF TRNQT CYL 34X4.125X (TOURNIQUET CUFF) ×1 IMPLANT
DECANTER SPIKE VIAL GLASS SM (MISCELLANEOUS) ×6 IMPLANT
DRAPE INCISE IOBAN 66X45 STRL (DRAPES) ×6 IMPLANT
DRAPE U-SHAPE 47X51 STRL (DRAPES) ×3 IMPLANT
DRSG AQUACEL AG ADV 3.5X10 (GAUZE/BANDAGES/DRESSINGS) ×3 IMPLANT
DURAPREP 26ML APPLICATOR (WOUND CARE) ×6 IMPLANT
ELECT REM PT RETURN 15FT ADLT (MISCELLANEOUS) ×3 IMPLANT
GLOVE BIOGEL M STRL SZ7.5 (GLOVE) ×3 IMPLANT
GLOVE BIOGEL PI IND STRL 7.5 (GLOVE) ×1 IMPLANT
GLOVE BIOGEL PI IND STRL 8.5 (GLOVE) ×2 IMPLANT
GLOVE BIOGEL PI INDICATOR 7.5 (GLOVE) ×2
GLOVE BIOGEL PI INDICATOR 8.5 (GLOVE) ×4
GLOVE SURG ORTHO 8.0 STRL STRW (GLOVE) ×9 IMPLANT
GOWN STRL REUS W/ TWL XL LVL3 (GOWN DISPOSABLE) ×2 IMPLANT
GOWN STRL REUS W/TWL XL LVL3 (GOWN DISPOSABLE) ×6
HANDPIECE INTERPULSE COAX TIP (DISPOSABLE) ×3
HDLS TROCR DRIL PIN KNEE 75 (Miscellaneous) ×2 IMPLANT
HEAD HEX HLDNG PIN KNEE (Miscellaneous) IMPLANT
HEX HEAD HLDNG PIN KNEE (Miscellaneous) ×3 IMPLANT
HOLDER FOLEY CATH W/STRAP (MISCELLANEOUS) ×3 IMPLANT
HOOD PEEL AWAY FLYTE STAYCOOL (MISCELLANEOUS) ×9 IMPLANT
KIT TURNOVER KIT A (KITS) IMPLANT
MANIFOLD NEPTUNE II (INSTRUMENTS) ×3 IMPLANT
NEEDLE HYPO 22GX1.5 SAFETY (NEEDLE) ×3 IMPLANT
NS IRRIG 1000ML POUR BTL (IV SOLUTION) ×3 IMPLANT
PACK TOTAL KNEE CUSTOM (KITS) ×3 IMPLANT
PIN DRILL HDLS TROCAR 75 4PK (Miscellaneous) IMPLANT
PIN SHORTHEAD HLD KNEE 25 (Miscellaneous) ×3 IMPLANT
PROTECTOR NERVE ULNAR (MISCELLANEOUS) ×3 IMPLANT
SET HNDPC FAN SPRY TIP SCT (DISPOSABLE) ×1 IMPLANT
STEM POLY PAT PLY 29M KNEE (Knees) ×2 IMPLANT
STEM TIBIA 5 DEG SZ C L KNEE (Knees) IMPLANT
STRIP CLOSURE SKIN 1/2X4 (GAUZE/BANDAGES/DRESSINGS) ×3 IMPLANT
SUT BONE WAX W31G (SUTURE) ×3 IMPLANT
SUT MNCRL AB 3-0 PS2 18 (SUTURE) ×3 IMPLANT
SUT STRATAFIX 0 PDS 27 VIOLET (SUTURE) ×3
SUT STRATAFIX PDS+ 0 24IN (SUTURE) ×3 IMPLANT
SUT VIC AB 1 CT1 36 (SUTURE) ×3 IMPLANT
SUTURE STRATFX 0 PDS 27 VIOLET (SUTURE) ×1 IMPLANT
SYR CONTROL 10ML LL (SYRINGE) ×6 IMPLANT
TIBIA STEM 5 DEG SZ C L KNEE (Knees) ×3 IMPLANT
TRAY FOLEY MTR SLVR 16FR STAT (SET/KITS/TRAYS/PACK) ×3 IMPLANT
WATER STERILE IRR 1000ML POUR (IV SOLUTION) ×6 IMPLANT
WRAP KNEE MAXI GEL POST OP (GAUZE/BANDAGES/DRESSINGS) ×3 IMPLANT
YANKAUER SUCT BULB TIP 10FT TU (MISCELLANEOUS) ×3 IMPLANT

## 2019-01-27 NOTE — Anesthesia Postprocedure Evaluation (Signed)
Anesthesia Post Note  Patient: ISSABELA LESKO  Procedure(s) Performed: TOTAL KNEE ARTHROPLASTY (Left Knee)     Patient location during evaluation: PACU Anesthesia Type: Spinal Level of consciousness: oriented, awake and alert and awake Pain management: pain level controlled Vital Signs Assessment: post-procedure vital signs reviewed and stable Respiratory status: spontaneous breathing, respiratory function stable, patient connected to nasal cannula oxygen and nonlabored ventilation Cardiovascular status: blood pressure returned to baseline and stable Postop Assessment: no headache, no backache, no apparent nausea or vomiting and spinal receding Anesthetic complications: no    Last Vitals:  Vitals:   01/27/19 1315 01/27/19 1351  BP: 91/64 92/81  Pulse: (!) 127 (!) 115  Resp: 20 18  Temp:  36.5 C  SpO2: 96% 94%    Last Pain:  Vitals:   01/27/19 1351  TempSrc: Tympanic  PainSc: 0-No pain                 [name]

## 2019-01-27 NOTE — Evaluation (Signed)
Physical Therapy Evaluation Patient Details Name: Karen Jackson MRN: [id number] DOB: 09-Jul-1941 Today's Date: 01/27/2019   History of Present Illness  78 yo female s/p L TKR on 01/27/19. PMH includes respiratory failure with hypoxia, HF, afib, obesity, CVA with L hemiparesis, osteopenia, HTN, OA, C-spine disorder, LUE/LLE neuropathy, R TKR, L UKR, ORIF L ankle fx 07/23/18.  Clinical Impression   Pt presents with L knee pain, decreased L knee ROM, difficulty performing mobility tasks, LE weakness L>R, and poor standing balance. Pt to benefit from acute PT to address deficits. Pt able to stand EOB x4, with focus of eval being on pre-gait (wieght shifting, stepping in place). Pt has not walked since November 2019, and requires mod assist to stay in standing. Pt educated on ankle pumps (20/hour) to perform this afternoon/evening to increase circulation, to pt's tolerance and limited by pain. PT to progress mobility as tolerated, and will continue to follow acutely.        Follow Up Recommendations Follow surgeon's recommendation for DC plan and follow-up therapies;Supervision for mobility/OOB(HHPT )    Equipment Recommendations  None recommended by PT    Recommendations for Other Services       Precautions / Restrictions Precautions Precautions: Fall Restrictions Weight Bearing Restrictions: No Other Position/Activity Restrictions: WBAT      Mobility  Bed Mobility Overal bed mobility: Needs Assistance Bed Mobility: Supine to Sit;Sit to Supine     Supine to sit: Mod assist;HOB elevated;+2 for safety/equipment Sit to supine: Mod assist;HOB elevated;+2 for safety/equipment   General bed mobility comments: Mod assist +2 for supine<>sit for LE management, scooting to and from EOB, postural control sitting EOB.   Transfers Overall transfer level: Needs assistance Equipment used: Rolling walker (2 wheeled) Transfers: Sit to/from Stand Sit to Stand: Mod assist;From elevated surface;+2  physical assistance;+2 safety/equipment         General transfer comment: Mod assist +2 for sit to stand for power up, hip extension to neutral, weight shifting L and R. Pt able to take a pre-gait step with LLE with verbal and manual cuing to shift weight to R, with facilitation of forward progression of LLE. Pt unable to take step at this time, too unsteady. Sit to stand x4 for strengthening, pre-gait.   Ambulation/Gait Ambulation/Gait assistance: (NT)              Stairs            Wheelchair Mobility    Modified Rankin (Stroke Patients Only)       Balance Overall balance assessment: Needs assistance;History of Falls Sitting-balance support: Bilateral upper extremity supported;Feet supported Sitting balance-Leahy Scale: Fair     Standing balance support: Bilateral upper extremity supported Standing balance-Leahy Scale: Poor Standing balance comment: reliant on UE support and PT/PT aide to maintain standing.                              Pertinent Vitals/Pain Pain Assessment: 0-10 Pain Score: 2  Pain Location: L knee Pain Descriptors / Indicators: Sore Pain Intervention(s): Monitored during session;Repositioned;Limited activity within patient's tolerance    Home Living Family/patient expects to be discharged to:: Private residence Living Arrangements: Alone Available Help at Discharge: Family;Personal care attendant;Available 24 hours/day(son from [place] there all week) Type of Home: House Home Access: Ramped entrance     Home Layout: One level Home Equipment: Central Garage - 2 wheels;Shower seat;Wheelchair - manual;Bedside commode;Grab bars - tub/shower Additional Comments: personal care attendents  7-2, 4:30-8:30     Prior Function Level of Independence: Needs assistance   Gait / Transfers Assistance Needed: Pt uses w/c for mobility, personal aides usually push her in w/c but pt is able to propel self  ADL's / Homemaking Assistance Needed: Pt  reports requiring assist for cooking, cleaning, dressing, and bathing  Comments: Prior to ankle fracture, pt was ambulating indoors with RW and wheelchair outdoors. Since ankle fracture, pt has not walked. Pt has been working on standing, up to 2 minutes.      Hand Dominance   Dominant Hand: Right    Extremity/Trunk Assessment   Upper Extremity Assessment Upper Extremity Assessment: Generalized weakness;LUE deficits/detail LUE Deficits / Details: flexor synergy noted, weak grip strength and shoulder pain/weakness LUE Sensation: history of peripheral neuropathy    Lower Extremity Assessment Lower Extremity Assessment: Generalized weakness;LLE deficits/detail LLE Deficits / Details: able to perform weak quad set, extensor tone noted when attempting to perform heel slide, able to perform small SLR with lift assist. Heel slide to 60* LLE Sensation: history of peripheral neuropathy    Cervical / Trunk Assessment Cervical / Trunk Assessment: Kyphotic  Communication   Communication: No difficulties  Cognition Arousal/Alertness: Awake/alert Behavior During Therapy: WFL for tasks assessed/performed Overall Cognitive Status: Within Functional Limits for tasks assessed                                        General Comments      Exercises     Assessment/Plan    PT Assessment Patient needs continued PT services  PT Problem List Decreased strength;Decreased mobility;Decreased safety awareness;Decreased range of motion;Decreased activity tolerance;Decreased balance;Decreased knowledge of use of DME;Pain;Decreased cognition       PT Treatment Interventions DME instruction;Functional mobility training;Balance training;Patient/family education;Gait training;Therapeutic activities;Stair training;Therapeutic exercise    PT Goals (Current goals can be found in the Care Plan section)  Acute Rehab PT Goals Patient Stated Goal: go home PT Goal Formulation: With  patient Time For Goal Achievement: 02/03/19 Potential to Achieve Goals: Good    Frequency 7X/week   Barriers to discharge        Co-evaluation               AM-PAC PT "6 Clicks" Mobility  Outcome Measure Help needed turning from your back to your side while in a flat bed without using bedrails?: A Lot Help needed moving from lying on your back to sitting on the side of a flat bed without using bedrails?: A Lot Help needed moving to and from a bed to a chair (including a wheelchair)?: A Lot Help needed standing up from a chair using your arms (e.g., wheelchair or bedside chair)?: A Lot Help needed to walk in hospital room?: Total Help needed climbing 3-5 steps with a railing? : Total 6 Click Score: 10    End of Session Equipment Utilized During Treatment: Gait belt Activity Tolerance: Patient limited by fatigue;Patient tolerated treatment well Patient left: with call bell/phone within reach;in bed;with bed alarm set;with SCD's reapplied Nurse Communication: Mobility status PT Visit Diagnosis: Other abnormalities of gait and mobility (R26.89);Difficulty in walking, not elsewhere classified (R26.2);History of falling (Z91.81)    Time: 8295-6213 PT Time Calculation (min) (ACUTE ONLY): 27 min   Charges:   PT Evaluation $PT Eval Low Complexity: 1 Low PT Treatments $Therapeutic Activity: 8-22 mins         D  Elonda Husky, [place] Pager [phone]  Office [phone]   [place] 01/27/2019, 7:16 PM

## 2019-01-27 NOTE — Transfer of Care (Signed)
Immediate Anesthesia Transfer of Care Note  Patient: Karen Jackson  Procedure(s) Performed: Procedure(s): TOTAL KNEE ARTHROPLASTY (Left)  Patient Location: PACU  Anesthesia Type:Spinal  Level of Consciousness:  sedated, patient cooperative and responds to stimulation  Airway & Oxygen Therapy:Patient Spontanous Breathing and Patient connected to face mask oxgen  Post-op Assessment:  Report given to PACU RN and Post -op Vital signs reviewed and stable  Post vital signs:  Reviewed and stable  Last Vitals:  Vitals:   01/27/19 1122 01/27/19 1126  BP: (!) 91/58 115/72  Pulse: (!) (P) 114 (!) 44  Resp: (P) 10 13  Temp: 36.8 C   SpO2: (P) 00% 63%    Complications: No apparent anesthesia complications

## 2019-01-27 NOTE — Op Note (Signed)
TOTAL KNEE REPLACEMENT OPERATIVE NOTE:  01/27/2019  11:33 AM  PATIENT:  Karen Jackson  78 y.o. female  PRE-OPERATIVE DIAGNOSIS:  LT. KNEE OSTEOARHTRITIS  POST-OPERATIVE DIAGNOSIS:  LT. KNEE OSTEOARHTRITIS  PROCEDURE:  Procedure(s): TOTAL KNEE ARTHROPLASTY  SURGEON:  Surgeon(s): Vickey Huger, MD  PHYSICIAN ASSISTANT: Carlyon Shadow, PA-C   ANESTHESIA:   spinal  SPECIMEN: None  COUNTS:  Correct  TOURNIQUET:   Total Tourniquet Time Documented: Thigh (Left) - 53 minutes Total: Thigh (Left) - 53 minutes   DICTATION:  Indication for procedure:    The patient is a 78 y.o. female who has failed conservative treatment for LT. KNEE OSTEOARHTRITIS.  Informed consent was obtained prior to anesthesia. The risks versus benefits of the operation were explain and in a way the patient can, and did, understand.    Description of procedure:     The patient was taken to the operating room and placed under anesthesia.  The patient was positioned in the usual fashion taking care that all body parts were adequately padded and/or protected.  A tourniquet was applied and the leg prepped and draped in the usual sterile fashion.  The extremity was exsanguinated with the esmarch and tourniquet inflated to 350 mmHg.  Pre-operative range of motion was normal.   A midline incision approximately 6-7 inches long was made with a #10 blade.  A new blade was used to make a parapatellar arthrotomy going 2-3 cm into the quadriceps tendon, over the patella, and alongside the medial aspect of the patellar tendon.  A synovectomy was then performed with the #10 blade and forceps. I then elevated the deep MCL off the medial tibial metaphysis subperiosteally around to the semimembranosus attachment.    I everted the patella and used calipers to measure patellar thickness.  I used the reamer to ream down to appropriate thickness to recreate the native thickness.  I then removed excess bone with the rongeur and sagittal  saw.  I used the appropriately sized template and drilled the three lug holes.  I then put the trial in place and measured the thickness with the calipers to ensure recreation of the native thickness.  The trial was then removed and the patella subluxed and the knee brought into flexion.  A homan retractor was place to retract and protect the patella and lateral structures.  A Z-retractor was place medially to protect the medial structures.  The extra-medullary alignment system was used to make cut the tibial articular surface perpendicular to the anamotic axis of the tibia and in 3 degrees of posterior slope.  The cut surface and alignment jig was removed.  I then used the intramedullary alignment guide to make a valgus cut on the distal femur.  I then marked out the epicondylar axis on the distal femur. I then used the anterior referencing sizer and measured the femur to be a size 5.  The 4-In-1 cutting block was screwed into place in external rotation matching the posterior condylar angle, making our cuts perpendicular to the epicondylar axis.  Anterior, posterior and chamfer cuts were made with the sagittal saw.  The cutting block and cut pieces were removed.  A lamina spreader was placed in 90 degrees of flexion.  The ACL, PCL, menisci, and posterior condylar osteophytes were removed.  A 13 mm spacer blocked was found to offer good flexion and extension gap balance after minimal in degree releasing.   The scoop retractor was then placed and the femoral finishing block was pinned in  place.  The small sagittal saw was used as well as the lug drill to finish the femur.  The block and cut surfaces were removed and the medullary canal hole filled with autograft bone from the cut pieces.  The tibia was delivered forward in deep flexion and external rotation.  A size C tray was selected and pinned into place centered on the medial 1/3 of the tibial tubercle.  The reamer and keel was used to prepare the tibia  through the tray.    I then trialed with the size 5 femur, size C tibia, a 13 mm insert and the 29 patella.  I had excellent flexion/extension gap balance, excellent patella tracking.  Flexion was full and beyond 120 degrees; extension was zero.  These components were chosen and the staff opened them to me on the back table while the knee was lavaged copiously and the cement mixed.  The soft tissue was infiltrated with 60cc of exparel 1.3% through a 21 gauge needle.  I cemented in the components and removed all excess cement.  The polyethylene tibial component was snapped into place and the knee placed in extension while cement was hardening.  The capsule was infilltrated with a 60cc exparel/marcaine/saline mixture.   Once the cement was hard, the tourniquet was let down.  Hemostasis was obtained.  The arthrotomy was closed using a #1 stratofix running suture.  The deep soft tissues were closed with #0 vicryls and the subcuticular layer closed with #2-0 vicryl.  The skin was reapproximated and closed with 3.0 Monocryl.  The wound was covered with steristrips, aquacel dressing, and a TED stocking.   The patient was then awakened, extubated, and taken to the recovery room in stable condition.  BLOOD LOSS:  250IB COMPLICATIONS:  None.  PLAN OF CARE: Admit for overnight observation  PATIENT DISPOSITION:  PACU - hemodynamically stable.     Please fax a copy of this op note to my office at [phone] (please only include page 1 and 2 of the Case Information op note)

## 2019-01-27 NOTE — Anesthesia Procedure Notes (Signed)
Spinal  Patient location during procedure: OR Start time: 01/27/2019 9:35 AM End time: 01/27/2019 9:38 AM Staffing Anesthesiologist: [name], MD Performed: anesthesiologist  Preanesthetic Checklist Completed: patient identified, surgical consent, pre-op evaluation, timeout performed, IV checked, risks and benefits discussed and monitors and equipment checked Spinal Block Patient position: sitting Prep: site prepped and draped and DuraPrep Patient monitoring: continuous pulse ox and blood pressure Approach: midline Location: L3-4 Injection technique: single-shot Needle Needle type: Pencan  Needle gauge: 24 G Assessment Sensory level: T8 Additional Notes Functioning IV was confirmed and monitors were applied. Sterile prep and drape, including hand hygiene, mask and sterile gloves were used. The patient was positioned and the spine was prepped. The skin was anesthetized with lidocaine.  Free flow of clear CSF was obtained prior to injecting local anesthetic into the CSF.  The spinal needle aspirated freely following injection.  The needle was carefully withdrawn.  The patient tolerated the procedure well. Consent was obtained prior to procedure with all questions answered and concerns addressed. Risks including but not limited to bleeding, infection, nerve damage, paralysis, failed block, inadequate analgesia, allergic reaction, high spinal, itching and headache were discussed and the patient wished to proceed.   Hoy Morn, MD

## 2019-01-27 NOTE — Progress Notes (Signed)
AssistedDr. Turk with left, ultrasound guided, adductor canal block. Side rails up, monitors on throughout procedure. See vital signs in flow sheet. Tolerated Procedure well.  

## 2019-01-27 NOTE — Anesthesia Procedure Notes (Signed)
Anesthesia Regional Block: Adductor canal block   Pre-Anesthetic Checklist: ,, timeout performed, Correct Patient, Correct Site, Correct Laterality, Correct Procedure, Correct Position, site marked, Risks and benefits discussed,  Surgical consent,  Pre-op evaluation,  At surgeon's request and post-op pain management  Laterality: Left  Prep: chloraprep       Needles:  Injection technique: Single-shot  Needle Type: Echogenic Needle     Needle Length: 9cm  Needle Gauge: 21     Additional Needles:   Procedures:,,,, ultrasound used (permanent image in chart),,,,  Narrative:  Start time: 01/27/2019 8:55 AM End time: 01/27/2019 9:01 AM Injection made incrementally with aspirations every 5 mL.  Performed by: Personally  Anesthesiologist: [name], MD  Additional Notes: No pain on injection. No increased resistance to injection. Injection made in 5cc increments.  Good needle visualization.  Patient tolerated procedure well.

## 2019-01-27 NOTE — H&P (Signed)
Karen Jackson MRN:  [id number] DOB/SEX:  1941/04/06/female  CHIEF COMPLAINT:  Painful left Knee  HISTORY: Patient is a 78 y.o. female presented with a history of pain in the left knee. Onset of symptoms was gradual starting a few years ago with gradually worsening course since that time. Patient has been treated conservatively with over-the-counter NSAIDs and activity modification. Patient currently rates pain in the knee at 10 out of 10 with activity. There is no pain at night.  PAST MEDICAL HISTORY: Patient Active Problem List   Diagnosis Date Noted  . Closed left ankle fracture 07/20/2018  . Acute ankle pain 05/23/2018  . Respiratory failure with hypoxia ([place]) 08/30/2017  . Hypoxemia   . Heart failure with preserved ejection fraction ([place]), Grade 3 diastolic dysfunction 37/16/9678  . Atrial fibrillation with RVR ([place])   . PAF (paroxysmal atrial fibrillation) ([place])   . Metabolic syndrome 93/81/0175  . Encounter for preventive health examination 02/17/2016  . Morbid obesity ([place]) 06/17/2015  . Hypomagnesemia 04/24/2014  . Hemiparesis affecting left side as late effect of cerebrovascular accident ([place]) 04/24/2014  . Nontraumatic cerebral hemorrhage ([place]) 04/30/2012  . DM (diabetes mellitus) with complications ([place]) 06/21/8526  . OSTEOPENIA 01/21/2009  . UNSPECIFIED VITAMIN D DEFICIENCY 11/19/2007  . ESSENTIAL HYPERTENSION, BENIGN 11/19/2007  . HYPERCHOLESTEROLEMIA 10/25/2006  . GASTROESOPHAGEAL REFLUX, NO ESOPHAGITIS 10/25/2006  . DIVERTICULOSIS OF COLON 10/25/2006  . Osteoarthritis 10/25/2006  . CERVICAL SPINE DISORDER, NOS 10/25/2006   Past Medical History:  Diagnosis Date  . Acute cystitis without hematuria   . Acute diastolic CHF (congestive heart failure) ([place])   . Arthritis   . Dyspnea   . Dysrhythmia   . Fever of unknown origin 03/19/2017  . Hyperlipidemia   . Hypertension    denies at preop  . Multifocal pneumonia   . Neuromuscular disorder ([place])    neuropathy  left arm and foot  . Osteopenia   . Paralysis ([place])    partial left side from CVA   . Persistent atrial fibrillation   . PONV (postoperative nausea and vomiting)   . Pre-diabetes   . Stroke [facility]) 2013   hemmorahgic   Past Surgical History:  Procedure Laterality Date  . ANKLE SURGERY    . APPENDECTOMY    . CHOLECYSTECTOMY    . HERNIA REPAIR     Esophagus  . JOINT REPLACEMENT     total- right partial- left  . MASTECTOMY PARTIAL / LUMPECTOMY  2012   left  . ORIF ANKLE FRACTURE Left 07/20/2018   Procedure: OPEN REDUCTION INTERNAL FIXATION (ORIF) ANKLE FRACTURE;  Surgeon: Wylene Simmer, MD;  Location: [place];  Service: Orthopedics;  Laterality: Left;     MEDICATIONS:   No medications prior to admission.    ALLERGIES:   Allergies  Allergen Reactions  . Codeine Phosphate Nausea And Vomiting  . Simvastatin Other (See Comments)    Myalgia  Changed brand ok now    REVIEW OF SYSTEMS:  A comprehensive review of systems was negative except for: Musculoskeletal: positive for arthralgias and bone pain   FAMILY HISTORY:   Family History  Problem Relation Age of Onset  . Diabetes Mother     SOCIAL HISTORY:   Social History   Tobacco Use  . Smoking status: Former Smoker    Packs/day: 0.50    Years: 23.00    Pack years: 11.50    Types: Cigarettes    Start date: 08/28/1957    Last attempt to quit: 03/09/1981    Years  since quitting: 37.9  . Smokeless tobacco: Never Used  Substance Use Topics  . Alcohol use: Yes    Alcohol/week: 15.0 standard drinks    Types: 1 Glasses of wine, 14 Standard drinks or equivalent per week    Comment: daily one glass     EXAMINATION:  Vital signs in last 24 hours:    There were no vitals taken for this visit.  General Appearance:    Alert, cooperative, no distress, appears stated age  Head:    Normocephalic, without obvious abnormality, atraumatic  Eyes:    PERRL, conjunctiva/corneas clear, EOM's intact, fundi    benign, both eyes   Ears:    Normal TM's and external ear canals, both ears  Nose:   Nares normal, septum midline, mucosa normal, no drainage    or sinus tenderness  Throat:   Lips, mucosa, and tongue normal; teeth and gums normal  Neck:   Supple, symmetrical, trachea midline, no adenopathy;    thyroid:  no enlargement/tenderness/nodules; no carotid   bruit or JVD  Back:     Symmetric, no curvature, ROM normal, no CVA tenderness  Lungs:     Clear to auscultation bilaterally, respirations unlabored  Chest Wall:    No tenderness or deformity   Heart:    Regular rate and rhythm, S1 and S2 normal, no murmur, rub   or gallop  Breast Exam:    No tenderness, masses, or nipple abnormality  Abdomen:     Soft, non-tender, bowel sounds active all four quadrants,    no masses, no organomegaly  Genitalia:    Normal female without lesion, discharge or tenderness  Rectal:    Normal tone, no masses or tenderness;   guaiac negative stool  Extremities:   Extremities normal, atraumatic, no cyanosis or edema  Pulses:   2+ and symmetric all extremities  Skin:   Skin color, texture, turgor normal, no rashes or lesions  Lymph nodes:   Cervical, supraclavicular, and axillary nodes normal  Neurologic:   CNII-XII intact, normal strength, sensation and reflexes    throughout    Musculoskeletal:  ROM 0-120, Ligaments intact,  Imaging Review Plain radiographs demonstrate s/p uni medial withsevere degenerative joint disease of the left knee. The overall alignment is mild valgus. The bone quality appears to be good for age and reported activity level.  Assessment/Plan: Primary osteoarthritis, left knee  S/p uni medial  The patient history, physical examination and imaging studies are consistent with advanced degenerative joint disease of the left knee. The patient has failed conservative treatment.  The clearance notes were reviewed.  After discussion with the patient it was felt that Total Knee Replacement was indicated. The  procedure,  risks, and benefits of total knee arthroplasty were presented and reviewed. The risks including but not limited to aseptic loosening, infection, blood clots, vascular injury, stiffness, patella tracking problems complications among others were discussed. The patient acknowledged the explanation, agreed to proceed with the plan.  Preoperative templating of the joint replacement has been completed, documented, and submitted to the Operating Room personnel in order to optimize intra-operative equipment management.    Patient's anticipated LOS is less than 2 midnights, meeting these requirements: - Lives within 1 hour of care - Has a competent adult at home to recover with post-op recover - NO history of  - Chronic pain requiring opiods  - Diabetes  - Coronary Artery Disease  - Heart failure  - Heart attack  - Stroke  - DVT/VTE  - Cardiac  arrhythmia  - Respiratory Failure/COPD  - Renal failure  - Anemia  - Advanced Liver disease       Donia Ast 01/27/2019, 6:22 AM

## 2019-01-28 DIAGNOSIS — I4819 Other persistent atrial fibrillation: Secondary | ICD-10-CM | POA: Diagnosis not present

## 2019-01-28 DIAGNOSIS — Z79899 Other long term (current) drug therapy: Secondary | ICD-10-CM | POA: Diagnosis not present

## 2019-01-28 DIAGNOSIS — M8580 Other specified disorders of bone density and structure, unspecified site: Secondary | ICD-10-CM | POA: Diagnosis not present

## 2019-01-28 DIAGNOSIS — M25562 Pain in left knee: Secondary | ICD-10-CM | POA: Diagnosis not present

## 2019-01-28 DIAGNOSIS — I69354 Hemiplegia and hemiparesis following cerebral infarction affecting left non-dominant side: Secondary | ICD-10-CM | POA: Diagnosis not present

## 2019-01-28 DIAGNOSIS — Z9049 Acquired absence of other specified parts of digestive tract: Secondary | ICD-10-CM | POA: Diagnosis not present

## 2019-01-28 DIAGNOSIS — Z885 Allergy status to narcotic agent status: Secondary | ICD-10-CM | POA: Diagnosis not present

## 2019-01-28 DIAGNOSIS — Z87891 Personal history of nicotine dependence: Secondary | ICD-10-CM | POA: Diagnosis not present

## 2019-01-28 DIAGNOSIS — Z7982 Long term (current) use of aspirin: Secondary | ICD-10-CM | POA: Diagnosis not present

## 2019-01-28 DIAGNOSIS — M1712 Unilateral primary osteoarthritis, left knee: Secondary | ICD-10-CM | POA: Diagnosis not present

## 2019-01-28 DIAGNOSIS — E119 Type 2 diabetes mellitus without complications: Secondary | ICD-10-CM | POA: Diagnosis not present

## 2019-01-28 DIAGNOSIS — Z6835 Body mass index (BMI) 35.0-35.9, adult: Secondary | ICD-10-CM | POA: Diagnosis not present

## 2019-01-28 DIAGNOSIS — I110 Hypertensive heart disease with heart failure: Secondary | ICD-10-CM | POA: Diagnosis not present

## 2019-01-28 DIAGNOSIS — Z9012 Acquired absence of left breast and nipple: Secondary | ICD-10-CM | POA: Diagnosis not present

## 2019-01-28 DIAGNOSIS — Z888 Allergy status to other drugs, medicaments and biological substances status: Secondary | ICD-10-CM | POA: Diagnosis not present

## 2019-01-28 DIAGNOSIS — I5032 Chronic diastolic (congestive) heart failure: Secondary | ICD-10-CM | POA: Diagnosis not present

## 2019-01-28 DIAGNOSIS — K219 Gastro-esophageal reflux disease without esophagitis: Secondary | ICD-10-CM | POA: Diagnosis not present

## 2019-01-28 LAB — CBC
HCT: 38.5 % (ref 36.0–46.0)
Hemoglobin: 12.5 g/dL (ref 12.0–15.0)
MCH: 32.9 pg (ref 26.0–34.0)
MCHC: 32.5 g/dL (ref 30.0–36.0)
MCV: 101.3 fL — ABNORMAL HIGH (ref 80.0–100.0)
Platelets: 293 K/uL (ref 150–400)
RBC: 3.8 MIL/uL — ABNORMAL LOW (ref 3.87–5.11)
RDW: 12.4 % (ref 11.5–15.5)
WBC: 9.9 K/uL (ref 4.0–10.5)
nRBC: 0 % (ref 0.0–0.2)

## 2019-01-28 LAB — BASIC METABOLIC PANEL
Anion gap: 9 (ref 5–15)
BUN: 13 mg/dL (ref 8–23)
CO2: 28 mmol/L (ref 22–32)
Calcium: 8.6 mg/dL — ABNORMAL LOW (ref 8.9–10.3)
Chloride: 98 mmol/L (ref 98–111)
Creatinine, Ser: 0.5 mg/dL (ref 0.44–1.00)
GFR calc Af Amer: >60 mL/min (ref >60)
GFR calc non Af Amer: >60 mL/min (ref >60)
Glucose, Bld: 168 mg/dL — ABNORMAL HIGH (ref 70–99)
Potassium: 4.5 mmol/L (ref 3.5–5.1)
Sodium: 135 mmol/L (ref 135–145)

## 2019-01-28 MED ORDER — TIZANIDINE HCL 2 MG PO TABS: 2.0000 mg | ORAL_TABLET | Freq: Four times a day (QID) | ORAL | 0 refills | Status: DC | PRN

## 2019-01-28 MED ORDER — ASPIRIN 325 MG PO TBEC: 325.0000 mg | DELAYED_RELEASE_TABLET | Freq: Two times a day (BID) | ORAL | 0 refills | Status: DC

## 2019-01-28 MED ORDER — OXYCODONE HCL 5 MG PO TABS: 5.0000 mg | ORAL_TABLET | Freq: Four times a day (QID) | ORAL | 0 refills | Status: DC | PRN

## 2019-01-28 NOTE — Progress Notes (Signed)
SPORTS MEDICINE AND JOINT REPLACEMENT  Lara Mulch, MD    Carlyon Shadow, PA-C [place], [place], [place]  [address]                             [phone]   PROGRESS NOTE  Subjective:  negative for Chest Pain  negative for Shortness of Breath  negative for Nausea/Vomiting   negative for Calf Pain  negative for Bowel Movement   Tolerating Diet: yes         Patient reports pain as 3 on 0-10 scale.    Objective: Vital signs in last 24 hours:    Patient Vitals for the past 24 hrs:  BP Temp Temp src Pulse Resp SpO2 Height Weight  01/28/19 0502 102/62 98.1 F (36.7 C) - 71 14 99 % - -  01/28/19 0054 120/72 (!) 97.5 F (36.4 C) Oral (!) 56 14 99 % - -  01/27/19 2133 - - - (!) 109 - - - -  01/27/19 2128 132/84 98 F (36.7 C) Oral (!) 129 16 98 % - -  01/27/19 1842 102/60 97.8 F (36.6 C) - 99 16 97 % - -  01/27/19 1705 (!) 172/160 97.7 F (36.5 C) Oral (!) 124 16 94 % - -  01/27/19 1549 (!) 106/47 97.8 F (36.6 C) - (!) 130 16 96 % - -  01/27/19 1454 113/84 97.8 F (36.6 C) - (!) 123 16 98 % - -  01/27/19 1351 92/81 97.7 F (36.5 C) Tympanic (!) 115 18 94 % - -  01/27/19 1315 91/64 - - (!) 127 20 96 % - -  01/27/19 1300 (!) 104/59 - - 82 (!) 24 95 % - -  01/27/19 1245 91/64 - - (!) 46 (!) 23 93 % - -  01/27/19 1230 94/69 - - (!) 132 17 94 % - -  01/27/19 1215 (!) 95/59 - - (!) 129 16 94 % - -  01/27/19 1200 94/64 - - (!) 50 (!) 21 98 % - -  01/27/19 1145 105/62 - - (!) 111 (!) 22 98 % - -  01/27/19 1130 110/71 - - 76 10 98 % - -  01/27/19 1126 115/72 - - (!) 44 13 99 % - -  01/27/19 1122 (!) 91/58 98.3 F (36.8 C) - (!) 114 10 98 % - -  01/27/19 0916 108/78 - - (!) 59 (!) 22 94 % - -  01/27/19 0915 - - - 96 13 96 % - -  01/27/19 0914 - - - 88 10 96 % - -  01/27/19 0913 - - - 80 13 93 % - -  01/27/19 0912 - - - (!) 35 10 93 % - -  01/27/19 0911 - - - (!) 48 12 93 % - -  01/27/19 0910 - - - 64 (!) 9 92 % - -  01/27/19 0909 - - - 95 (!) 9 91 % - -   01/27/19 0908 - - - 82 (!) 9 90 % - -  01/27/19 0907 - - - 71 (!) 9 91 % - -  01/27/19 0906 - - - 65 11 (!) 89 % - -  01/27/19 0905 (!) 121/92 - - (!) 45 12 90 % - -  01/27/19 0904 - - - 69 19 92 % - -  01/27/19 0903 - - - (!) 133 13 95 % - -  01/27/19 0902 - - - Marland Kitchen)  135 20 96 % - -  01/27/19 0901 109/74 - - 81 (!) 22 95 % - -  01/27/19 0900 - - - (!) 49 (!) 22 96 % - -  01/27/19 0858 - - - (!) 59 15 97 % - -  01/27/19 0857 - - - (!) 39 17 97 % - -  01/27/19 0856 - - - (!) 40 20 97 % - -  01/27/19 0855 - - - 72 17 97 % - -  01/27/19 0854 - - - 87 (!) 26 98 % - -  01/27/19 0853 - - - 94 19 97 % - -  01/27/19 0731 - - - - - - 4\' 11"  (1.499 m) 80.7 kg  01/27/19 0715 139/63 99 F (37.2 C) Oral 73 18 94 % - -    @flow {1959:LAST@   Intake/Output from previous day:   06/01 0701 - 06/02 0700 In: 2575.9 [P.O.:600; I.V.:1799.8] Out: 990 [Urine:940]   Intake/Output this shift:   06/01 1901 - 06/02 0700 In: 1483.4 [P.O.:360; I.V.:1047.3] Out: 800 [Urine:800]   Intake/Output      06/01 0701 - 06/02 0700   P.O. 600   I.V. (mL/kg) 1799.8 (22.3)   Other 100   IV Piggyback 76.1   Total Intake(mL/kg) 2575.9 (31.9)   Urine (mL/kg/hr) 940 (0.5)   Blood 50   Total Output 990   Net +1585.9          LABORATORY DATA: Recent Labs    01/23/19 1131 01/28/19 0435  WBC 7.5 9.9  HGB 15.9* 12.5  HCT 48.5* 38.5  PLT 378 293   Recent Labs    01/23/19 1131 01/28/19 0435  NA 138 135  K 4.1 4.5  CL 96* 98  CO2 34* 28  BUN 19 13  CREATININE 0.65 0.50  GLUCOSE 176* 168*  CALCIUM 9.6 8.6*   Lab Results  Component Value Date   INR 0.91 07/13/2010    Examination:  General appearance: alert, cooperative and no distress Extremities: extremities normal, atraumatic, no cyanosis or edema  Wound Exam: clean, dry, intact   Drainage:  None: wound tissue dry  Motor Exam: Quadriceps and Hamstrings Intact  Sensory Exam: Superficial Peroneal, Deep Peroneal and Tibial  normal   Assessment:    1 Day Post-Op  Procedure(s) (LRB): TOTAL KNEE ARTHROPLASTY (Left)  ADDITIONAL DIAGNOSIS:  Active Problems:   S/P total knee replacement     Plan: Physical Therapy as ordered Weight Bearing as Tolerated (WBAT)  DVT Prophylaxis:  Aspirin  DISCHARGE PLAN: Home  DISCHARGE NEEDS: HHPT  Patient doing well, expected to go home as long as cleared by PT      Patient's anticipated LOS is less than 2 midnights, meeting these requirements: - Lives within 1 hour of care - Has a competent adult at home to recover with post-op recover - NO history of  - Chronic pain requiring opiods  - Diabetes  - Coronary Artery Disease  - Heart failure  - Heart attack  - Stroke  - DVT/VTE  - Cardiac arrhythmia  - Respiratory Failure/COPD  - Renal failure  - Anemia  - Advanced Liver disease        Donia Ast 01/28/2019, 6:56 AM

## 2019-01-28 NOTE — TOC Transition Note (Signed)
Transition of Care [facility]) - CM/SW Discharge Note   Patient Details  Name: Karen Jackson MRN: [id number] Date of Birth: 1941-02-02  [facility] CM/SW Contact:  Lia Hopping, [place] Phone Number: 01/28/2019, 9:43 AM   Clinical Narrative:     Final next level of care: Home w Home Health Services Barriers to Discharge: No Barriers Identified   Patient Goals and CMS Choice Patient states their goals for this hospitalization and ongoing recovery are:: Home      Discharge Placement  Home                      Discharge Plan and Services                DME Arranged: (Has DME )         HH Arranged: PT [place] Agency: [place] ([facility])        Social Determinants of Health (SDOH) Interventions     Readmission Risk Interventions No flowsheet data found.

## 2019-01-28 NOTE — Discharge Summary (Deleted)
SPORTS MEDICINE & JOINT REPLACEMENT   Lara Mulch, MD   Carlyon Shadow, PA-C [address] [place], [place], [place]  [address]                             [phone]  PATIENT ID: Karen Jackson        MRN:  [id number]          DOB/AGE: 12-05-1940 / 78 y.o.    DISCHARGE SUMMARY  ADMISSION DATE:    01/27/2019 DISCHARGE DATE:   01/28/2019   ADMISSION DIAGNOSIS: LT. KNEE OSTEOARHTRITIS    DISCHARGE DIAGNOSIS:  LT. KNEE OSTEOARHTRITIS    ADDITIONAL DIAGNOSIS: Active Problems:   S/P total knee replacement  Past Medical History:  Diagnosis Date  . Acute cystitis without hematuria   . Acute diastolic CHF (congestive heart failure) ([place])   . Arthritis   . Dyspnea   . Dysrhythmia   . Fever of unknown origin 03/19/2017  . Hyperlipidemia   . Hypertension    denies at preop  . Multifocal pneumonia   . Neuromuscular disorder ([place])    neuropathy left arm and foot  . Osteopenia   . 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 Persistent atrial fibrillation   . PONV (postoperative nausea and vomiting)   . Pre-diabetes   . Stroke [facility]) 2013   hemmorahgic    PROCEDURE: Procedure(s): TOTAL KNEE ARTHROPLASTY on 01/27/2019  CONSULTS:    HISTORY:  See H&P in chart  HOSPITAL COURSE:  PEARLIE NIES is a 78 y.o. admitted on 01/27/2019 and found to have a diagnosis of LT. KNEE OSTEOARHTRITIS.  After appropriate laboratory studies were obtained  they were taken to the operating room on 01/27/2019 and underwent Procedure(s): TOTAL KNEE ARTHROPLASTY.   They were given perioperative antibiotics:  Anti-infectives (From admission, onward)   Start     Dose/Rate Route Frequency Ordered Stop   01/27/19 1530  ceFAZolin (ANCEF) IVPB 1 g/50 mL premix     1 g 100 mL/hr over 30 Minutes Intravenous Every 6 hours 01/27/19 1348 01/27/19 2218   01/27/19 0715  ceFAZolin (ANCEF) IVPB 2g/100 mL premix     2 g 200 mL/hr over 30 Minutes Intravenous On call to O.R. 01/27/19 2585 01/27/19 0933    .  Patient  given tranexamic acid IV or topical and exparel intra-operatively.  Tolerated the procedure well.    POD# 1: Vital signs were stable.  Patient denied Chest pain, shortness of breath, or calf pain.  Patient was started on Aspirin twice daily at 8am.  Consults to PT, OT, and care management were made.  The patient was weight bearing as tolerated.  CPM was placed on the operative leg 0-90 degrees for 6-8 hours a day. When out of the CPM, patient was placed in the foam block to achieve full extension. Incentive spirometry was taught.  Dressing was changed.       POD #2, Continued  PT for ambulation and exercise program.  IV saline locked.  O2 discontinued.    The remainder of the hospital course was dedicated to ambulation and strengthening.   The patient was discharged on 1 Day Post-Op in  Good condition.  Blood products given:none  DIAGNOSTIC STUDIES: Recent vital signs:  Patient Vitals for the past 24 hrs:  BP Temp Temp src Pulse Resp SpO2 Height Weight  01/28/19 0502 102/62 98.1 F (36.7 C) - 71 14 99 % - -  01/28/19 0054 120/72 (!) 97.5 F (36.4 C) Oral (!) 56 14 99 % - -  01/27/19 2133 - - - (!) 109 - - - -  01/27/19 2128 132/84 98 F (36.7 C) Oral (!) 129 16 98 % - -  01/27/19 1842 102/60 97.8 F (36.6 C) - 99 16 97 % - -  01/27/19 1705 (!) 172/160 97.7 F (36.5 C) Oral (!) 124 16 94 % - -  01/27/19 1549 (!) 106/47 97.8 F (36.6 C) - (!) 130 16 96 % - -  01/27/19 1454 113/84 97.8 F (36.6 C) - (!) 123 16 98 % - -  01/27/19 1351 92/81 97.7 F (36.5 C) Tympanic (!) 115 18 94 % - -  01/27/19 1315 91/64 - - (!) 127 20 96 % - -  01/27/19 1300 (!) 104/59 - - 82 (!) 24 95 % - -  01/27/19 1245 91/64 - - (!) 46 (!) 23 93 % - -  01/27/19 1230 94/69 - - (!) 132 17 94 % - -  01/27/19 1215 (!) 95/59 - - (!) 129 16 94 % - -  01/27/19 1200 94/64 - - (!) 50 (!) 21 98 % - -  01/27/19 1145 105/62 - - (!) 111 (!) 22 98 % - -  01/27/19 1130 110/71 - - 76 10 98 % - -  01/27/19 1126 115/72 -  - (!) 44 13 99 % - -  01/27/19 1122 (!) 91/58 98.3 F (36.8 C) - (!) 114 10 98 % - -  01/27/19 0916 108/78 - - (!) 59 (!) 22 94 % - -  01/27/19 0915 - - - 96 13 96 % - -  01/27/19 0914 - - - 88 10 96 % - -  01/27/19 0913 - - - 80 13 93 % - -  01/27/19 0912 - - - (!) 35 10 93 % - -  01/27/19 0911 - - - (!) 48 12 93 % - -  01/27/19 0910 - - - 64 (!) 9 92 % - -  01/27/19 0909 - - - 95 (!) 9 91 % - -  01/27/19 0908 - - - 82 (!) 9 90 % - -  01/27/19 0907 - - - 71 (!) 9 91 % - -  01/27/19 0906 - - - 65 11 (!) 89 % - -  01/27/19 0905 (!) 121/92 - - (!) 45 12 90 % - -  01/27/19 0904 - - - 69 19 92 % - -  01/27/19 0903 - - - (!) 133 13 95 % - -  01/27/19 0902 - - - (!) 135 20 96 % - -  01/27/19 0901 109/74 - - 81 (!) 22 95 % - -  01/27/19 0900 - - - (!) 49 (!) 22 96 % - -  01/27/19 0858 - - - (!) 59 15 97 % - -  01/27/19 0857 - - - (!) 39 17 97 % - -  01/27/19 0856 - - - (!) 40 20 97 % - -  01/27/19 0855 - - - 72 17 97 % - -  01/27/19 0854 - - - 87 (!) 26 98 % - -  01/27/19 0853 - - - 94 19 97 % - -  01/27/19 0731 - - - - - - 4\' 11"  (1.499 m) 80.7 kg  01/27/19 0715 139/63 99 F (37.2 C) Oral 73 18 94 % - -       Recent laboratory studies: Recent  Labs    01/23/19 1131 01/28/19 0435  WBC 7.5 9.9  HGB 15.9* 12.5  HCT 48.5* 38.5  PLT 378 293   Recent Labs    01/23/19 1131 01/28/19 0435  NA 138 135  K 4.1 4.5  CL 96* 98  CO2 34* 28  BUN 19 13  CREATININE 0.65 0.50  GLUCOSE 176* 168*  CALCIUM 9.6 8.6*   Lab Results  Component Value Date   INR 0.91 07/13/2010     Recent Radiographic Studies :  No results found.  DISCHARGE INSTRUCTIONS: Discharge Instructions    Call MD / Call 911   Complete by:  As directed    If you experience chest pain or shortness of breath, CALL 911 and be transported to the hospital emergency room.  If you develope a fever above 101 F, pus (white drainage) or increased drainage or redness at the wound, or calf pain, call your surgeon's office.    Constipation Prevention   Complete by:  As directed    Drink plenty of fluids.  Prune juice may be helpful.  You may use a stool softener, such as Colace (over the counter) 100 mg twice a day.  Use MiraLax (over the counter) for constipation as needed.   Diet - low sodium heart healthy   Complete by:  As directed    Discharge instructions   Complete by:  As directed    INSTRUCTIONS AFTER JOINT REPLACEMENT   Remove items at home which could result in a fall. This includes throw rugs or furniture in walking pathways ICE to the affected joint every three hours while awake for 30 minutes at a time, for at least the first 3-5 days, and then as needed for pain and swelling.  Continue to use ice for pain and swelling. You may notice swelling that will progress down to the foot and ankle.  This is normal after surgery.  Elevate your leg when you are not up walking on it.   Continue to use the breathing machine you got in the hospital (incentive spirometer) which will help keep your temperature down.  It is common for your temperature to cycle up and down following surgery, especially at night when you are not up moving around and exerting yourself.  The breathing machine keeps your lungs expanded and your temperature down.   DIET:  As you were doing prior to hospitalization, we recommend a well-balanced diet.  DRESSING / WOUND CARE / SHOWERING  Keep the surgical dressing until follow up.  The dressing is water proof, so you can shower without any extra covering.  IF THE DRESSING FALLS OFF or the wound gets wet inside, change the dressing with sterile gauze.  Please use good hand washing techniques before changing the dressing.  Do not use any lotions or creams on the incision until instructed by your surgeon.    ACTIVITY  Increase activity slowly as tolerated, but follow the weight bearing instructions below.   No driving for 6 weeks or until further direction given by your physician.  You cannot  drive while taking narcotics.  No lifting or carrying greater than 10 lbs. until further directed by your surgeon. Avoid periods of inactivity such as sitting longer than an hour when not asleep. This helps prevent blood clots.  You may return to work once you are authorized by your doctor.     WEIGHT BEARING   Weight bearing as tolerated with assist device (walker, cane, etc) as directed, use it  as long as suggested by your surgeon or therapist, typically at least 4-6 weeks.   EXERCISES  Results after joint replacement surgery are often greatly improved when you follow the exercise, range of motion and muscle strengthening exercises prescribed by your doctor. Safety measures are also important to protect the joint from further injury. Any time any of these exercises cause you to have increased pain or swelling, decrease what you are doing until you are comfortable again and then slowly increase them. If you have problems or questions, call your caregiver or physical therapist for advice.   Rehabilitation is important following a joint replacement. After just a few days of immobilization, the muscles of the leg can become weakened and shrink (atrophy).  These exercises are designed to build up the tone and strength of the thigh and leg muscles and to improve motion. Often times heat used for twenty to thirty minutes before working out will loosen up your tissues and help with improving the range of motion but do not use heat for the first two weeks following surgery (sometimes heat can increase post-operative swelling).   These exercises can be done on a training (exercise) mat, on the floor, on a table or on a bed. Use whatever works the best and is most comfortable for you.    Use music or television while you are exercising so that the exercises are a pleasant break in your day. This will make your life better with the exercises acting as a break in your routine that you can look forward to.    Perform all exercises about fifteen times, three times per day or as directed.  You should exercise both the operative leg and the other leg as well.   Exercises include:   Quad Sets - Tighten up the muscle on the front of the thigh (Quad) and hold for 5-10 seconds.   Straight Leg Raises - With your knee straight (if you were given a brace, keep it on), lift the leg to 60 degrees, hold for 3 seconds, and slowly lower the leg.  Perform this exercise against resistance later as your leg gets stronger.  Leg Slides: Lying on your back, slowly slide your foot toward your buttocks, bending your knee up off the floor (only go as far as is comfortable). Then slowly slide your foot back down until your leg is flat on the floor again.  Angel Wings: Lying on your back spread your legs to the side as far apart as you can without causing discomfort.  Hamstring Strength:  Lying on your back, push your heel against the floor with your leg straight by tightening up the muscles of your buttocks.  Repeat, but this time bend your knee to a comfortable angle, and push your heel against the floor.  You may put a pillow under the heel to make it more comfortable if necessary.   A rehabilitation program following joint replacement surgery can speed recovery and prevent re-injury in the future due to weakened muscles. Contact your doctor or a physical therapist for more information on knee rehabilitation.    CONSTIPATION  Constipation is defined medically as fewer than three stools per week and severe constipation as less than one stool per week.  Even if you have a regular bowel pattern at home, your normal regimen is likely to be disrupted due to multiple reasons following surgery.  Combination of anesthesia, postoperative narcotics, change in appetite and fluid intake all can affect your bowels.  YOU MUST use at least one of the following options; they are listed in order of increasing strength to get the job done.   They are all available over the counter, and you may need to use some, POSSIBLY even all of these options:    Drink plenty of fluids (prune juice may be helpful) and high fiber foods Colace 100 mg by mouth twice a day  Senokot for constipation as directed and as needed Dulcolax (bisacodyl), take with full glass of water  Miralax (polyethylene glycol) once or twice a day as needed.  If you have tried all these things and are unable to have a bowel movement in the first 3-4 days after surgery call either your surgeon or your primary doctor.    If you experience loose stools or diarrhea, hold the medications until you stool forms back up.  If your symptoms do not get better within 1 week or if they get worse, check with your doctor.  If you experience "the worst abdominal pain ever" or develop nausea or vomiting, please contact the office immediately for further recommendations for treatment.   ITCHING:  If you experience itching with your medications, try taking only a single pain pill, or even half a pain pill at a time.  You can also use Benadryl over the counter for itching or also to help with sleep.   TED HOSE STOCKINGS:  Use stockings on both legs until for at least 2 weeks or as directed by physician office. They may be removed at night for sleeping.  MEDICATIONS:  See your medication summary on the "After Visit Summary" that nursing will review with you.  You may have some home medications which will be placed on hold until you complete the course of blood thinner medication.  It is important for you to complete the blood thinner medication as prescribed.  PRECAUTIONS:  If you experience chest pain or shortness of breath - call 911 immediately for transfer to the hospital emergency department.   If you develop a fever greater that 101 F, purulent drainage from wound, increased redness or drainage from wound, foul odor from the wound/dressing, or calf pain - CONTACT YOUR SURGEON.                                                    FOLLOW-UP APPOINTMENTS:  If you do not already have a post-op appointment, please call the office for an appointment to be seen by your surgeon.  Guidelines for how soon to be seen are listed in your "After Visit Summary", but are typically between 1-4 weeks after surgery.  OTHER INSTRUCTIONS:   Knee Replacement:  Do not place pillow under knee, focus on keeping the knee straight while resting. CPM instructions: 0-90 degrees, 2 hours in the morning, 2 hours in the afternoon, and 2 hours in the evening. Place foam block, curve side up under heel at all times except when in CPM or when walking.  DO NOT modify, tear, cut, or change the foam block in any way.  MAKE SURE YOU:  Understand these instructions.  Get help right away if you are not doing well or get worse.    Thank you for letting us be a part of your medical care team.  It is a privilege we respect greatly.  We hope these instructions  will help you stay on track for a fast and full recovery!   Increase activity slowly as tolerated   Complete by:  As directed       DISCHARGE MEDICATIONS:   Allergies as of 01/28/2019      Reactions   Codeine Phosphate Nausea And Vomiting   Simvastatin Other (See Comments)   Myalgia  Changed brand ok now      Medication List    STOP taking these medications   celecoxib 200 MG capsule Commonly known as:  CELEBREX     TAKE these medications   aspirin 325 MG EC tablet Take 1 tablet (325 mg total) by mouth 2 (two) times daily.   atorvastatin 20 MG tablet Commonly known as:  LIPITOR TAKE 1 TABLET BY MOUTH EVERY DAY What changed:  when to take this   Calcium Carbonate-Vitamin D3 600-400 MG-UNIT Tabs Take 1 tablet by mouth daily.   docusate sodium 100 MG capsule Commonly known as:  Colace Take 1 capsule (100 mg total) by mouth 2 (two) times daily. While taking narcotic pain medicine. What changed:    when to take this  reasons to take this    furosemide 80 MG tablet Commonly known as:  LASIX TAKE 1 TABLET BY MOUTH EVERY DAY   gabapentin 100 MG capsule Commonly known as:  NEURONTIN TAKE 1 CAPSULE BY MOUTH THREE TIMES DAILY   Hair/Skin/Nails Caps Take 3 capsules by mouth daily.   MAGnesium-Oxide 400 (241.3 Mg) MG tablet Generic drug:  magnesium oxide TAKE 1 TABLET BY MOUTH DAILY   metoprolol tartrate 50 MG tablet Commonly known as:  LOPRESSOR Take 0.5 tablets (25 mg total) by mouth 2 (two) times daily.   omeprazole 20 MG capsule Commonly known as:  PRILOSEC TAKE 1 CAPSULE BY MOUTH EVERY DAY   oxyCODONE 5 MG immediate release tablet Commonly known as:  Oxy IR/ROXICODONE Take 1-2 tablets (5-10 mg total) by mouth every 6 (six) hours as needed for moderate pain (pain score 4-6).   polyethylene glycol 17 g packet Commonly known as:  MIRALAX / GLYCOLAX Take 17 g by mouth daily. What changed:    when to take this  reasons to take this   senna 8.6 MG Tabs tablet Commonly known as:  SENOKOT Take 2 tablets (17.2 mg total) by mouth 2 (two) times daily. What changed:    when to take this  reasons to take this   tiZANidine 2 MG tablet Commonly known as:  ZANAFLEX Take 1 tablet (2 mg total) by mouth every 6 (six) hours as needed.   traZODone 50 MG tablet Commonly known as:  DESYREL TAKE 1/2 TO 1 TABLET BY MOUTH AT BEDTIME AS NEEDED FOR SLEEP What changed:    how much to take  when to take this  additional instructions   TURMERIC PO Take 1 capsule by mouth daily.            Durable Medical Equipment  (From admission, onward)         Start     Ordered   01/27/19 1349  DME Walker rolling  Once    Question:  Patient needs a walker to treat with the following condition  Answer:  S/P total knee replacement   01/27/19 1348   01/27/19 1349  DME 3 n 1  Once     01/27/19 1348   01/27/19 1349  DME Bedside commode  Once    Question:  Patient needs a bedside commode to treat with the  following  condition  Answer:  S/P total knee replacement   01/27/19 1348          FOLLOW UP VISIT:    DISPOSITION: HOME VS. SNF  CONDITION:  Good   Donia Ast 01/28/2019, 7:00 AM

## 2019-01-28 NOTE — Progress Notes (Signed)
Physical Therapy Treatment Patient Details Name: Karen Jackson MRN: [id number] DOB: 1940-11-04 Today's Date: 01/28/2019    History of Present Illness 78 yo female s/p L TKR on 01/27/19. PMH includes respiratory failure with hypoxia, HF, afib, obesity, CVA with L hemiparesis, osteopenia, HTN, OA, C-spine disorder, LUE/LLE neuropathy, R TKR, L UKR, ORIF L ankle fx 07/23/18.    PT Comments    Pt supine in bed and willing to participate with therapy today.  Began session reviewing exercises to complete at home to assist with knee mobility and strengthening.  Pt given printout and reviewed mechanics and purpose for all exercises.  Pt with mod A for safety with bed mobility, cueing for UE reach to handrails to assist with trunk rotation for supine to sit, min A from therapist with LE.  Pt presents with increased difficulty with sit to stands today required mod A for standing with multiple attempts complete and increased assistance required for standing following 20-30 seconds due to LE weakness and fatigue. Pt required max A x2 to reduce sliding forward off EOB.  Attempted transfer training for safe return home, pt too fatigued to complete will attempt later today if able.  EOS pt left in bed with call bell within reach, bed alarm set and RN aware of status.  Ice applied to knee for pain and edema control.    Follow Up Recommendations  Follow surgeon's recommendation for DC plan and follow-up therapies;Supervision for mobility/OOB(HHPT)     Equipment Recommendations  None recommended by PT    Recommendations for Other Services       Precautions / Restrictions Precautions Precautions: Fall Restrictions Weight Bearing Restrictions: No Other Position/Activity Restrictions: WBAT    Mobility  Bed Mobility Overal bed mobility: Needs Assistance Bed Mobility: Supine to Sit;Sit to Supine     Supine to sit: Mod assist;HOB elevated;+2 for safety/equipment Sit to supine: Mod assist;HOB elevated;+2 for  safety/equipment   General bed mobility comments: Mod assist +2 for supine<>sit for LE management, scooting to and from EOB, postural control sitting EOB.   Transfers Overall transfer level: Needs assistance Equipment used: Rolling walker (2 wheeled) Transfers: Sit to/from Stand Sit to Stand: Max assist;+2 physical assistance;+2 safety/equipment         General transfer comment: Mod assist +2 for sit to stand for power up, hip extension to neutral, weight shifting L and R. Pt able to take a pre-gait step with LLE with verbal and manual cuing to shift weight to R, with facilitation of forward progression of LLE. Pt unable to take step at this time, too unsteady. Sit to stand x4 for strengthening, pre-gait.   PT scooted forward off EOB and required max A to reduce fall to gloor  Ambulation/Gait                 Stairs             Wheelchair Mobility    Modified Rankin (Stroke Patients Only)       Balance                                            Cognition Arousal/Alertness: Awake/alert Behavior During Therapy: WFL for tasks assessed/performed Overall Cognitive Status: Within Functional Limits for tasks assessed  Exercises Total Joint Exercises Ankle Circles/Pumps: AROM;20 reps;Supine Quad Sets: Left;AROM;Strengthening;10 reps;Supine Gluteal Sets: AROM;Left;10 reps;Supine;Strengthening Short Arc Quad: AAROM;Strengthening;Right;10 reps;Supine Heel Slides: Strengthening;Right;10 reps;Supine Hip ABduction/ADduction: Both;Strengthening;AROM;10 reps Straight Leg Raises: AROM;Right;10 reps;Supine Long Arc Quad: AROM;Right;10 reps;Seated    General Comments        Pertinent Vitals/Pain Pain Assessment: No/denies pain Pain Score: 2  Pain Location: L knee, reports no real pain just soreness on muscles near knee Pain Descriptors / Indicators: Sore Pain Intervention(s): Monitored during  session;Repositioned;Limited activity within patient's tolerance    Home Living                      Prior Function            PT Goals (current goals can now be found in the care plan section)      Frequency    7X/week      PT Plan      Co-evaluation              AM-PAC PT "6 Clicks" Mobility   Outcome Measure  Help needed turning from your back to your side while in a flat bed without using bedrails?: A Lot Help needed moving from lying on your back to sitting on the side of a flat bed without using bedrails?: A Lot Help needed moving to and from a bed to a chair (including a wheelchair)?: A Lot Help needed standing up from a chair using your arms (e.g., wheelchair or bedside chair)?: A Lot Help needed to walk in hospital room?: Total Help needed climbing 3-5 steps with a railing? : Total 6 Click Score: 10    End of Session Equipment Utilized During Treatment: Gait belt Activity Tolerance: Patient limited by fatigue;Patient tolerated treatment well Patient left: in bed;with call bell/phone within reach;with bed alarm set Nurse Communication: Mobility status PT Visit Diagnosis: Other abnormalities of gait and mobility (R26.89);Difficulty in walking, not elsewhere classified (R26.2);History of falling (Z91.81)     Time: 5277-8242 PT Time Calculation (min) (ACUTE ONLY): 35 min  Charges:  $Gait Training: 8-22 mins $Therapeutic Activity: 8-22 mins                     [address], LPTA; [place]   Aldona Lento 01/28/2019, 2:53 PM

## 2019-01-28 NOTE — Progress Notes (Signed)
Physical Therapy Treatment Patient Details Name: Karen Jackson MRN: [id number] DOB: 1940-09-10 Today's Date: 01/28/2019    History of Present Illness 78 yo female s/p L TKR on 01/27/19. PMH includes respiratory failure with hypoxia, HF, afib, obesity, CVA with L hemiparesis, osteopenia, HTN, OA, C-spine disorder, LUE/LLE neuropathy, R TKR, L UKR, ORIF L ankle fx 07/23/18.    PT Comments    Pt sleeping upon entrance, able to easily awaken and willing to participate.  Pt reports nausea earlier today, no symptoms now.  Pt able to complete all therex for strengthening with good form and demonstration.  Increase ease with sit to stand this afternoon, continues to required 2+ for assistance and safety with mod A required and ability to stand for >30" prior fatigue to sit.  Transfer training was discussed but not attempted due to fatigue today, plan to discuss mechanics and trial next PT session prior DC home.  Pt continues to require max A to scoot to EOB, able to follow directions and complete for assistance with bridge tecniques and UE A as well.  NO reports of increased pain, was limited by fatigue.  Pt left in bed with call bell within reach and ice on knee.  RN aware of status.      Follow Up Recommendations  Follow surgeon's recommendation for DC plan and follow-up therapies;Supervision for mobility/OOB(HHPT)     Equipment Recommendations  None recommended by PT    Recommendations for Other Services       Precautions / Restrictions Precautions Precautions: Fall Restrictions Weight Bearing Restrictions: No Other Position/Activity Restrictions: WBAT    Mobility  Bed Mobility Overal bed mobility: Needs Assistance Bed Mobility: Supine to Sit;Sit to Supine     Supine to sit: Mod assist;HOB elevated;+2 for safety/equipment Sit to supine: Mod assist;HOB elevated;+2 for safety/equipment   General bed mobility comments: Mod assist +2 for supine<>sit for LE management, scooting to and from  EOB, postural control sitting EOB.   Transfers Overall transfer level: Needs assistance Equipment used: Rolling walker (2 wheeled) Transfers: Sit to/from Stand Sit to Stand: Mod assist;+2 physical assistance;+2 safety/equipment         General transfer comment: Mod A +2 for sit to stand for safety.  Cueing for hand placement to assist with safe STS.  Upon standing cueing for posture, hip extension to neutral, TKE both knees, weight shifting Lt and Rt  Upon to take step, too unsteady, fatigued and weakn    Ambulation/Gait                 Stairs             Wheelchair Mobility    Modified Rankin (Stroke Patients Only)       Balance                                            Cognition Arousal/Alertness: Awake/alert Behavior During Therapy: WFL for tasks assessed/performed Overall Cognitive Status: Within Functional Limits for tasks assessed                                        Exercises Total Joint Exercises Ankle Circles/Pumps: AROM;20 reps;Supine Quad Sets: Left;AROM;Strengthening;10 reps;Supine Gluteal Sets: AROM;Left;10 reps;Supine;Strengthening Short Arc Quad: AAROM;Strengthening;Right;10 reps;Supine Heel Slides: Strengthening;Right;10 reps;Supine Hip ABduction/ADduction: Both;Strengthening;AROM;10 reps  Straight Leg Raises: AROM;Right;10 reps;Supine Long Arc Quad: AROM;Right;10 reps;Seated Goniometric ROM: AROM 5-90* Lt knee     General Comments        Pertinent Vitals/Pain Pain Assessment: No/denies pain Pain Score: 2  Pain Location: L knee, reports no real pain just soreness on muscles near knee Pain Descriptors / Indicators: Sore Pain Intervention(s): Monitored during session;Repositioned;Ice applied    Home Living                      Prior Function            PT Goals (current goals can now be found in the care plan section)      Frequency    7X/week      PT Plan Current plan  remains appropriate    Co-evaluation              AM-PAC PT "6 Clicks" Mobility   Outcome Measure  Help needed turning from your back to your side while in a flat bed without using bedrails?: A Lot Help needed moving from lying on your back to sitting on the side of a flat bed without using bedrails?: A Lot Help needed moving to and from a bed to a chair (including a wheelchair)?: A Lot Help needed standing up from a chair using your arms (e.g., wheelchair or bedside chair)?: A Lot Help needed to walk in hospital room?: Total Help needed climbing 3-5 steps with a railing? : Total 6 Click Score: 10    End of Session Equipment Utilized During Treatment: Gait belt Activity Tolerance: Patient limited by fatigue;Patient tolerated treatment well Patient left: in bed;with call bell/phone within reach;with bed alarm set Nurse Communication: Mobility status PT Visit Diagnosis: Other abnormalities of gait and mobility (R26.89);Difficulty in walking, not elsewhere classified (R26.2);History of falling (Z91.81)     Time: 1610-9604 PT Time Calculation (min) (ACUTE ONLY): 39 min  Charges:  $Gait Training: 8-22 mins $Therapeutic Activity: 8-22 mins                     [address], LPTA; [place]  Aldona Lento 01/28/2019, 4:22 PM

## 2019-01-29 LAB — CBC
HCT: 36.6 % (ref 36.0–46.0)
Hemoglobin: 11.8 g/dL — ABNORMAL LOW (ref 12.0–15.0)
MCH: 32.9 pg (ref 26.0–34.0)
MCHC: 32.2 g/dL (ref 30.0–36.0)
MCV: 101.9 fL — ABNORMAL HIGH (ref 80.0–100.0)
Platelets: 238 10*3/uL (ref 150–400)
RBC: 3.59 MIL/uL — ABNORMAL LOW (ref 3.87–5.11)
RDW: 12.5 % (ref 11.5–15.5)
WBC: 7.3 10*3/uL (ref 4.0–10.5)
nRBC: 0 % (ref 0.0–0.2)

## 2019-01-29 NOTE — Discharge Summary (Signed)
SPORTS MEDICINE & JOINT REPLACEMENT   Lara Mulch, MD   Carlyon Shadow, PA-C [place], [place], [place]  [address]                             [phone]  PATIENT ID: Karen Jackson        MRN:  [id number]          DOB/AGE: September 30, 1940 / 78 y.o.    DISCHARGE SUMMARY  ADMISSION DATE:    01/27/2019 DISCHARGE DATE:   01/29/2019   ADMISSION DIAGNOSIS: LT. KNEE OSTEOARHTRITIS    DISCHARGE DIAGNOSIS:  LT. KNEE OSTEOARHTRITIS    ADDITIONAL DIAGNOSIS: Active Problems:   S/P total knee replacement  Past Medical History:  Diagnosis Date  . Acute cystitis without hematuria   . Acute diastolic CHF (congestive heart failure) ([place])   . Arthritis   . Dyspnea   . Dysrhythmia   . Fever of unknown origin 03/19/2017  . Hyperlipidemia   . Hypertension    denies at preop  . Multifocal pneumonia   . Neuromuscular disorder ([place])    neuropathy left arm and foot  . Osteopenia   . Paralysis ([place])    partial left side from CVA   . Persistent atrial fibrillation   . PONV (postoperative nausea and vomiting)   . Pre-diabetes   . Stroke [facility]) 2013   hemmorahgic    PROCEDURE: Procedure(s): TOTAL KNEE ARTHROPLASTY on 01/27/2019  CONSULTS:    HISTORY:  See H&P in chart  HOSPITAL COURSE:  Karen Jackson is a 78 y.o. admitted on 01/27/2019 and found to have a diagnosis of LT. KNEE OSTEOARHTRITIS.  After appropriate laboratory studies were obtained  they were taken to the operating room on 01/27/2019 and underwent Procedure(s): TOTAL KNEE ARTHROPLASTY.   They were given perioperative antibiotics:  Anti-infectives (From admission, onward)   Start     Dose/Rate Route Frequency Ordered Stop   01/27/19 1530  ceFAZolin (ANCEF) IVPB 1 g/50 mL premix     1 g 100 mL/hr over 30 Minutes Intravenous Every 6 hours 01/27/19 1348 01/27/19 2218   01/27/19 0715  ceFAZolin (ANCEF) IVPB 2g/100 mL premix     2 g 200 mL/hr over 30 Minutes Intravenous On call to O.R. 01/27/19 5456 01/27/19 0933    .  Patient  given tranexamic acid IV or topical and exparel intra-operatively.  Tolerated the procedure well.    POD# 1: Vital signs were stable.  Patient denied Chest pain, shortness of breath, or calf pain.  Patient was started on Aspirin twice daily at 8am.  Consults to PT, OT, and care management were made.  The patient was weight bearing as tolerated.  CPM was placed on the operative leg 0-90 degrees for 6-8 hours a day. When out of the CPM, patient was placed in the foam block to achieve full extension. Incentive spirometry was taught.  Dressing was changed.       POD #2, Continued  PT for ambulation and exercise program.  IV saline locked.  O2 discontinued.    The remainder of the hospital course was dedicated to ambulation and strengthening.   The patient was discharged on 2 Days Post-Op in  Good condition.  Blood products given:none  DIAGNOSTIC STUDIES: Recent vital signs:  Patient Vitals for the past 24 hrs:  BP Temp Temp src Pulse Resp SpO2  01/29/19 0946 - - - - - 91 %  01/29/19 0944 - - - - -  97 %  01/29/19 0939 (!) 120/94 - - 72 - (!) 88 %  01/29/19 0502 110/68 (!) 97.4 F (36.3 C) Oral 66 16 100 %  01/28/19 2228 111/64 97.9 F (36.6 C) Axillary 64 14 99 %  01/28/19 1427 108/64 98.1 F (36.7 C) Oral 71 15 98 %       Recent laboratory studies: Recent Labs    01/23/19 1131 01/28/19 0435 01/29/19 0421  WBC 7.5 9.9 7.3  HGB 15.9* 12.5 11.8*  HCT 48.5* 38.5 36.6  PLT 378 293 238   Recent Labs    01/23/19 1131 01/28/19 0435  NA 138 135  K 4.1 4.5  CL 96* 98  CO2 34* 28  BUN 19 13  CREATININE 0.65 0.50  GLUCOSE 176* 168*  CALCIUM 9.6 8.6*   Lab Results  Component Value Date   INR 0.91 07/13/2010     Recent Radiographic Studies :  No results found.  DISCHARGE INSTRUCTIONS: Discharge Instructions    CPM   Complete by:  As directed    Continuous passive motion machine (CPM):      Use the CPM from 0 to 90 for 4-6 hours per day.      You may increase by 10 per  day.  You may break it up into 2 or 3 sessions per day.      Use CPM for 2 weeks or until you are told to stop.   Call MD / Call 911   Complete by:  As directed    If you experience chest pain or shortness of breath, CALL 911 and be transported to the hospital emergency room.  If you develope a fever above 101 F, pus (white drainage) or increased drainage or redness at the wound, or calf pain, call your surgeon's office.   Call MD / Call 911   Complete by:  As directed    If you experience chest pain or shortness of breath, CALL 911 and be transported to the hospital emergency room.  If you develope a fever above 101 F, pus (white drainage) or increased drainage or redness at the wound, or calf pain, call your surgeon's office.   Constipation Prevention   Complete by:  As directed    Drink plenty of fluids.  Prune juice may be helpful.  You may use a stool softener, such as Colace (over the counter) 100 mg twice a day.  Use MiraLax (over the counter) for constipation as needed.   Constipation Prevention   Complete by:  As directed    Drink plenty of fluids.  Prune juice may be helpful.  You may use a stool softener, such as Colace (over the counter) 100 mg twice a day.  Use MiraLax (over the counter) for constipation as needed.   Diet - low sodium heart healthy   Complete by:  As directed    Diet - low sodium heart healthy   Complete by:  As directed    Discharge instructions   Complete by:  As directed    INSTRUCTIONS AFTER JOINT REPLACEMENT   Remove items at home which could result in a fall. This includes throw rugs or furniture in walking pathways ICE to the affected joint every three hours while awake for 30 minutes at a time, for at least the first 3-5 days, and then as needed for pain and swelling.  Continue to use ice for pain and swelling. You may notice swelling that will progress down to the foot and ankle.  This is normal after surgery.  Elevate your leg when you are not up walking  on it.   Continue to use the breathing machine you got in the hospital (incentive spirometer) which will help keep your temperature down.  It is common for your temperature to cycle up and down following surgery, especially at night when you are not up moving around and exerting yourself.  The breathing machine keeps your lungs expanded and your temperature down.   DIET:  As you were doing prior to hospitalization, we recommend a well-balanced diet.  DRESSING / WOUND CARE / SHOWERING  Keep the surgical dressing until follow up.  The dressing is water proof, so you can shower without any extra covering.  IF THE DRESSING FALLS OFF or the wound gets wet inside, change the dressing with sterile gauze.  Please use good hand washing techniques before changing the dressing.  Do not use any lotions or creams on the incision until instructed by your surgeon.    ACTIVITY  Increase activity slowly as tolerated, but follow the weight bearing instructions below.   No driving for 6 weeks or until further direction given by your physician.  You cannot drive while taking narcotics.  No lifting or carrying greater than 10 lbs. until further directed by your surgeon. Avoid periods of inactivity such as sitting longer than an hour when not asleep. This helps prevent blood clots.  You may return to work once you are authorized by your doctor.     WEIGHT BEARING   Weight bearing as tolerated with assist device (walker, cane, etc) as directed, use it as long as suggested by your surgeon or therapist, typically at least 4-6 weeks.   EXERCISES  Results after joint replacement surgery are often greatly improved when you follow the exercise, range of motion and muscle strengthening exercises prescribed by your doctor. Safety measures are also important to protect the joint from further injury. Any time any of these exercises cause you to have increased pain or swelling, decrease what you are doing until you are  comfortable again and then slowly increase them. If you have problems or questions, call your caregiver or physical therapist for advice.   Rehabilitation is important following a joint replacement. After just a few days of immobilization, the muscles of the leg can become weakened and shrink (atrophy).  These exercises are designed to build up the tone and strength of the thigh and leg muscles and to improve motion. Often times heat used for twenty to thirty minutes before working out will loosen up your tissues and help with improving the range of motion but do not use heat for the first two weeks following surgery (sometimes heat can increase post-operative swelling).   These exercises can be done on a training (exercise) mat, on the floor, on a table or on a bed. Use whatever works the best and is most comfortable for you.    Use music or television while you are exercising so that the exercises are a pleasant break in your day. This will make your life better with the exercises acting as a break in your routine that you can look forward to.   Perform all exercises about fifteen times, three times per day or as directed.  You should exercise both the operative leg and the other leg as well.   Exercises include:   Quad Sets - Tighten up the muscle on the front of the thigh (Quad) and hold for 5-10 seconds.  Straight Leg Raises - With your knee straight (if you were given a brace, keep it on), lift the leg to 60 degrees, hold for 3 seconds, and slowly lower the leg.  Perform this exercise against resistance later as your leg gets stronger.  Leg Slides: Lying on your back, slowly slide your foot toward your buttocks, bending your knee up off the floor (only go as far as is comfortable). Then slowly slide your foot back down until your leg is flat on the floor again.  Angel Wings: Lying on your back spread your legs to the side as far apart as you can without causing discomfort.  Hamstring Strength:   Lying on your back, push your heel against the floor with your leg straight by tightening up the muscles of your buttocks.  Repeat, but this time bend your knee to a comfortable angle, and push your heel against the floor.  You may put a pillow under the heel to make it more comfortable if necessary.   A rehabilitation program following joint replacement surgery can speed recovery and prevent re-injury in the future due to weakened muscles. Contact your doctor or a physical therapist for more information on knee rehabilitation.    CONSTIPATION  Constipation is defined medically as fewer than three stools per week and severe constipation as less than one stool per week.  Even if you have a regular bowel pattern at home, your normal regimen is likely to be disrupted due to multiple reasons following surgery.  Combination of anesthesia, postoperative narcotics, change in appetite and fluid intake all can affect your bowels.   YOU MUST use at least one of the following options; they are listed in order of increasing strength to get the job done.  They are all available over the counter, and you may need to use some, POSSIBLY even all of these options:    Drink plenty of fluids (prune juice may be helpful) and high fiber foods Colace 100 mg by mouth twice a day  Senokot for constipation as directed and as needed Dulcolax (bisacodyl), take with full glass of water  Miralax (polyethylene glycol) once or twice a day as needed.  If you have tried all these things and are unable to have a bowel movement in the first 3-4 days after surgery call either your surgeon or your primary doctor.    If you experience loose stools or diarrhea, hold the medications until you stool forms back up.  If your symptoms do not get better within 1 week or if they get worse, check with your doctor.  If you experience "the worst abdominal pain ever" or develop nausea or vomiting, please contact the office immediately for further  recommendations for treatment.   ITCHING:  If you experience itching with your medications, try taking only a single pain pill, or even half a pain pill at a time.  You can also use Benadryl over the counter for itching or also to help with sleep.   TED HOSE STOCKINGS:  Use stockings on both legs until for at least 2 weeks or as directed by physician office. They may be removed at night for sleeping.  MEDICATIONS:  See your medication summary on the "After Visit Summary" that nursing will review with you.  You may have some home medications which will be placed on hold until you complete the course of blood thinner medication.  It is important for you to complete the blood thinner medication as prescribed.  PRECAUTIONS:  If you experience chest pain or shortness of breath - call 911 immediately for transfer to the hospital emergency department.   If you develop a fever greater that 101 F, purulent drainage from wound, increased redness or drainage from wound, foul odor from the wound/dressing, or calf pain - CONTACT YOUR SURGEON.                                                   FOLLOW-UP APPOINTMENTS:  If you do not already have a post-op appointment, please call the office for an appointment to be seen by your surgeon.  Guidelines for how soon to be seen are listed in your "After Visit Summary", but are typically between 1-4 weeks after surgery.  OTHER INSTRUCTIONS:   Knee Replacement:  Do not place pillow under knee, focus on keeping the knee straight while resting. CPM instructions: 0-90 degrees, 2 hours in the morning, 2 hours in the afternoon, and 2 hours in the evening. Place foam block, curve side up under heel at all times except when in CPM or when walking.  DO NOT modify, tear, cut, or change the foam block in any way.  MAKE SURE YOU:  Understand these instructions.  Get help right away if you are not doing well or get worse.    Thank you for letting us be a part of your medical  care team.  It is a privilege we respect greatly.  We hope these instructions will help you stay on track for a fast and full recovery!   Discharge instructions   Complete by:  As directed    INSTRUCTIONS AFTER JOINT REPLACEMENT   Remove items at home which could result in a fall. This includes throw rugs or furniture in walking pathways ICE to the affected joint every three hours while awake for 30 minutes at a time, for at least the first 3-5 days, and then as needed for pain and swelling.  Continue to use ice for pain and swelling. You may notice swelling that will progress down to the foot and ankle.  This is normal after surgery.  Elevate your leg when you are not up walking on it.   Continue to use the breathing machine you got in the hospital (incentive spirometer) which will help keep your temperature down.  It is common for your temperature to cycle up and down following surgery, especially at night when you are not up moving around and exerting yourself.  The breathing machine keeps your lungs expanded and your temperature down.   DIET:  As you were doing prior to hospitalization, we recommend a well-balanced diet.  DRESSING / WOUND CARE / SHOWERING  Keep the surgical dressing until follow up.  The dressing is water proof, so you can shower without any extra covering.  IF THE DRESSING FALLS OFF or the wound gets wet inside, change the dressing with sterile gauze.  Please use good hand washing techniques before changing the dressing.  Do not use any lotions or creams on the incision until instructed by your surgeon.    ACTIVITY  Increase activity slowly as tolerated, but follow the weight bearing instructions below.   No driving for 6 weeks or until further direction given by your physician.  You cannot drive while taking narcotics.  No lifting or carrying greater than 10 lbs. until further  directed by your surgeon. Avoid periods of inactivity such as sitting longer than an hour when  not asleep. This helps prevent blood clots.  You may return to work once you are authorized by your doctor.     WEIGHT BEARING   Weight bearing as tolerated with assist device (walker, cane, etc) as directed, use it as long as suggested by your surgeon or therapist, typically at least 4-6 weeks.   EXERCISES  Results after joint replacement surgery are often greatly improved when you follow the exercise, range of motion and muscle strengthening exercises prescribed by your doctor. Safety measures are also important to protect the joint from further injury. Any time any of these exercises cause you to have increased pain or swelling, decrease what you are doing until you are comfortable again and then slowly increase them. If you have problems or questions, call your caregiver or physical therapist for advice.   Rehabilitation is important following a joint replacement. After just a few days of immobilization, the muscles of the leg can become weakened and shrink (atrophy).  These exercises are designed to build up the tone and strength of the thigh and leg muscles and to improve motion. Often times heat used for twenty to thirty minutes before working out will loosen up your tissues and help with improving the range of motion but do not use heat for the first two weeks following surgery (sometimes heat can increase post-operative swelling).   These exercises can be done on a training (exercise) mat, on the floor, on a table or on a bed. Use whatever works the best and is most comfortable for you.    Use music or television while you are exercising so that the exercises are a pleasant break in your day. This will make your life better with the exercises acting as a break in your routine that you can look forward to.   Perform all exercises about fifteen times, three times per day or as directed.  You should exercise both the operative leg and the other leg as well.   Exercises include:   Quad Sets  - Tighten up the muscle on the front of the thigh (Quad) and hold for 5-10 seconds.   Straight Leg Raises - With your knee straight (if you were given a brace, keep it on), lift the leg to 60 degrees, hold for 3 seconds, and slowly lower the leg.  Perform this exercise against resistance later as your leg gets stronger.  Leg Slides: Lying on your back, slowly slide your foot toward your buttocks, bending your knee up off the floor (only go as far as is comfortable). Then slowly slide your foot back down until your leg is flat on the floor again.  Angel Wings: Lying on your back spread your legs to the side as far apart as you can without causing discomfort.  Hamstring Strength:  Lying on your back, push your heel against the floor with your leg straight by tightening up the muscles of your buttocks.  Repeat, but this time bend your knee to a comfortable angle, and push your heel against the floor.  You may put a pillow under the heel to make it more comfortable if necessary.   A rehabilitation program following joint replacement surgery can speed recovery and prevent re-injury in the future due to weakened muscles. Contact your doctor or a physical therapist for more information on knee rehabilitation.    CONSTIPATION  Constipation is defined medically as fewer  than three stools per week and severe constipation as less than one stool per week.  Even if you have a regular bowel pattern at home, your normal regimen is likely to be disrupted due to multiple reasons following surgery.  Combination of anesthesia, postoperative narcotics, change in appetite and fluid intake all can affect your bowels.   YOU MUST use at least one of the following options; they are listed in order of increasing strength to get the job done.  They are all available over the counter, and you may need to use some, POSSIBLY even all of these options:    Drink plenty of fluids (prune juice may be helpful) and high fiber  foods Colace 100 mg by mouth twice a day  Senokot for constipation as directed and as needed Dulcolax (bisacodyl), take with full glass of water  Miralax (polyethylene glycol) once or twice a day as needed.  If you have tried all these things and are unable to have a bowel movement in the first 3-4 days after surgery call either your surgeon or your primary doctor.    If you experience loose stools or diarrhea, hold the medications until you stool forms back up.  If your symptoms do not get better within 1 week or if they get worse, check with your doctor.  If you experience "the worst abdominal pain ever" or develop nausea or vomiting, please contact the office immediately for further recommendations for treatment.   ITCHING:  If you experience itching with your medications, try taking only a single pain pill, or even half a pain pill at a time.  You can also use Benadryl over the counter for itching or also to help with sleep.   TED HOSE STOCKINGS:  Use stockings on both legs until for at least 2 weeks or as directed by physician office. They may be removed at night for sleeping.  MEDICATIONS:  See your medication summary on the "After Visit Summary" that nursing will review with you.  You may have some home medications which will be placed on hold until you complete the course of blood thinner medication.  It is important for you to complete the blood thinner medication as prescribed.  PRECAUTIONS:  If you experience chest pain or shortness of breath - call 911 immediately for transfer to the hospital emergency department.   If you develop a fever greater that 101 F, purulent drainage from wound, increased redness or drainage from wound, foul odor from the wound/dressing, or calf pain - CONTACT YOUR SURGEON.                                                   FOLLOW-UP APPOINTMENTS:  If you do not already have a post-op appointment, please call the office for an appointment to be seen by your  surgeon.  Guidelines for how soon to be seen are listed in your "After Visit Summary", but are typically between 1-4 weeks after surgery.  OTHER INSTRUCTIONS:   Knee Replacement:  Do not place pillow under knee, focus on keeping the knee straight while resting. CPM instructions: 0-90 degrees, 2 hours in the morning, 2 hours in the afternoon, and 2 hours in the evening. Place foam block, curve side up under heel at all times except when in CPM or when walking.  DO NOT modify, tear, cut,  or change the foam block in any way.  MAKE SURE YOU:  Understand these instructions.  Get help right away if you are not doing well or get worse.    Thank you for letting us be a part of your medical care team.  It is a privilege we respect greatly.  We hope these instructions will help you stay on track for a fast and full recovery!   Increase activity slowly as tolerated   Complete by:  As directed    Increase activity slowly as tolerated   Complete by:  As directed       DISCHARGE MEDICATIONS:   Allergies as of 01/29/2019      Reactions   Codeine Phosphate Nausea And Vomiting   Simvastatin Other (See Comments)   Myalgia  Changed brand ok now      Medication List    STOP taking these medications   celecoxib 200 MG capsule Commonly known as:  CELEBREX     TAKE these medications   aspirin 325 MG EC tablet Take 1 tablet (325 mg total) by mouth 2 (two) times daily.   atorvastatin 20 MG tablet Commonly known as:  LIPITOR TAKE 1 TABLET BY MOUTH EVERY DAY What changed:  when to take this   Calcium Carbonate-Vitamin D3 600-400 MG-UNIT Tabs Take 1 tablet by mouth daily.   docusate sodium 100 MG capsule Commonly known as:  Colace Take 1 capsule (100 mg total) by mouth 2 (two) times daily. While taking narcotic pain medicine. What changed:    when to take this  reasons to take this   furosemide 80 MG tablet Commonly known as:  LASIX TAKE 1 TABLET BY MOUTH EVERY DAY   gabapentin 100 MG  capsule Commonly known as:  NEURONTIN TAKE 1 CAPSULE BY MOUTH THREE TIMES DAILY   Hair/Skin/Nails Caps Take 3 capsules by mouth daily.   MAGnesium-Oxide 400 (241.3 Mg) MG tablet Generic drug:  magnesium oxide TAKE 1 TABLET BY MOUTH DAILY   metoprolol tartrate 50 MG tablet Commonly known as:  LOPRESSOR Take 0.5 tablets (25 mg total) by mouth 2 (two) times daily.   omeprazole 20 MG capsule Commonly known as:  PRILOSEC TAKE 1 CAPSULE BY MOUTH EVERY DAY   oxyCODONE 5 MG immediate release tablet Commonly known as:  Oxy IR/ROXICODONE Take 1-2 tablets (5-10 mg total) by mouth every 6 (six) hours as needed for moderate pain (pain score 4-6).   polyethylene glycol 17 g packet Commonly known as:  MIRALAX / GLYCOLAX Take 17 g by mouth daily. What changed:    when to take this  reasons to take this   senna 8.6 MG Tabs tablet Commonly known as:  SENOKOT Take 2 tablets (17.2 mg total) by mouth 2 (two) times daily. What changed:    when to take this  reasons to take this   tiZANidine 2 MG tablet Commonly known as:  ZANAFLEX Take 1 tablet (2 mg total) by mouth every 6 (six) hours as needed.   traZODone 50 MG tablet Commonly known as:  DESYREL TAKE 1/2 TO 1 TABLET BY MOUTH AT BEDTIME AS NEEDED FOR SLEEP What changed:    how much to take  when to take this  additional instructions   TURMERIC PO Take 1 capsule by mouth daily.            Durable Medical Equipment  (From admission, onward)         Start     Ordered   01/27/19  [place]  DME Walker rolling  Once    Question:  Patient needs a walker to treat with the following condition  Answer:  S/P total knee replacement   01/27/19 1348   01/27/19 1349  DME 3 n 1  Once     01/27/19 1348   01/27/19 1349  DME Bedside commode  Once    Question:  Patient needs a bedside commode to treat with the following condition  Answer:  S/P total knee replacement   01/27/19 1348          FOLLOW UP VISIT:    DISPOSITION:  HOME VS. SNF  CONDITION:  Good   Donia Ast 01/29/2019, 10:03 AM

## 2019-01-29 NOTE — Progress Notes (Signed)
Physical Therapy Treatment Patient Details Name: Karen Jackson MRN: [id number] DOB: 08-12-1941 Today's Date: 01/29/2019    History of Present Illness 78 yo female s/p L TKR on 01/27/19. PMH includes respiratory failure with hypoxia, HF, afib, obesity, CVA with L hemiparesis, osteopenia, HTN, OA, C-spine disorder, LUE/LLE neuropathy, R TKR, L UKR, ORIF L ankle fx 07/23/18.    PT Comments    Pt friendly and willing to participate with therapy today.  Pain low and did not limit any activity today.  Therex complete with good form, pt able to recall ankle pumps, quad sets and heel slides.  Increase ease with sit to stand and ability to transfer to [facility] with mod A +2 for safety.  Upon standing pt unable to move feet so SPT to [facility] complete due to LE weakness and fatigue with standing.  EOS pt left in [facility] for DC home.  Ice applied to knee and RN aware of status.      Follow Up Recommendations  Follow surgeon's recommendation for DC plan and follow-up therapies;Supervision for mobility/OOB     Equipment Recommendations  None recommended by PT    Recommendations for Other Services       Precautions / Restrictions Precautions Precautions: Fall Restrictions Weight Bearing Restrictions: No Other Position/Activity Restrictions: WBAT    Mobility  Bed Mobility Overal bed mobility: Needs Assistance Bed Mobility: Supine to Sit     Supine to sit: Mod assist;HOB elevated;+2 for safety/equipment     General bed mobility comments: Mod assist +2 for supine<>sit for LE management, scooting to and from EOB, postural control sitting EOB.   Transfers Overall transfer level: Needs assistance Equipment used: Rolling walker (2 wheeled) Transfers: Sit to/from [facility] Sit to Stand: Mod assist;+2 physical assistance;+2 safety/equipment Stand pivot transfers: Mod assist;+2 physical assistance;+2 safety/equipment       General transfer comment: Mod A +2 for safety with sit to stand.  Upon  standing pt unable to move either foot due to weakness, unsteady and fatigued with activity.  SPT with mod A +2 for safety  Ambulation/Gait                 Stairs             Wheelchair Mobility    Modified Rankin (Stroke Patients Only)       Balance                                            Cognition Arousal/Alertness: Awake/alert Behavior During Therapy: WFL for tasks assessed/performed Overall Cognitive Status: Within Functional Limits for tasks assessed                                        Exercises Total Joint Exercises Ankle Circles/Pumps: AROM;20 reps;Supine Quad Sets: Left;AROM;Strengthening;10 reps;Supine Short Arc Quad: AAROM;Strengthening;Right;10 reps;Supine Heel Slides: Strengthening;Right;10 reps;Supine Hip ABduction/ADduction: Both;Strengthening;AROM;10 reps Straight Leg Raises: AROM;Right;10 reps;Supine Long Arc Quad: AROM;Right;10 reps;Seated Goniometric ROM: AROM 6-88* Lt knee Bridges: 5 reps;Supine(To assist NT with dressing)    General Comments        Pertinent Vitals/Pain Pain Assessment: 0-10 Pain Score: 2  Pain Location: L knee, reports no real pain just soreness on muscles near knee Pain Descriptors / Indicators: Sore Pain Intervention(s): Monitored during session;Limited activity within  patient's tolerance;Repositioned;Ice applied    Home Living                      Prior Function            PT Goals (current goals can now be found in the care plan section)      Frequency    7X/week      PT Plan Current plan remains appropriate    Co-evaluation              AM-PAC PT "6 Clicks" Mobility   Outcome Measure  Help needed turning from your back to your side while in a flat bed without using bedrails?: A Lot Help needed moving from lying on your back to sitting on the side of a flat bed without using bedrails?: A Lot Help needed moving to and from a bed to a  chair (including a wheelchair)?: A Lot Help needed standing up from a chair using your arms (e.g., wheelchair or bedside chair)?: A Lot Help needed to walk in hospital room?: Total Help needed climbing 3-5 steps with a railing? : Total 6 Click Score: 10    End of Session Equipment Utilized During Treatment: Gait belt Activity Tolerance: Patient limited by fatigue;Patient tolerated treatment well Patient left: in chair;with call bell/phone within reach(Left in WC with ice on knee and call bell within reach) Nurse Communication: Mobility status PT Visit Diagnosis: Other abnormalities of gait and mobility (R26.89);Difficulty in walking, not elsewhere classified (R26.2);History of falling (Z91.81)     Time: 1015-1050 PT Time Calculation (min) (ACUTE ONLY): 35 min  Charges:  $Therapeutic Exercise: 8-22 mins $Therapeutic Activity: 8-22 mins                     [address], LPTA; CBIS [phone]  Aldona Lento 01/29/2019, 12:46 PM

## 2019-01-30 ENCOUNTER — Encounter (HOSPITAL_COMMUNITY): Payer: Self-pay | Admitting: Orthopedic Surgery

## 2019-01-31 DIAGNOSIS — Z9981 Dependence on supplemental oxygen: Secondary | ICD-10-CM | POA: Diagnosis not present

## 2019-01-31 DIAGNOSIS — Z7982 Long term (current) use of aspirin: Secondary | ICD-10-CM | POA: Diagnosis not present

## 2019-01-31 DIAGNOSIS — I5031 Acute diastolic (congestive) heart failure: Secondary | ICD-10-CM | POA: Diagnosis not present

## 2019-01-31 DIAGNOSIS — Z96652 Presence of left artificial knee joint: Secondary | ICD-10-CM | POA: Diagnosis not present

## 2019-01-31 DIAGNOSIS — Z87891 Personal history of nicotine dependence: Secondary | ICD-10-CM | POA: Diagnosis not present

## 2019-01-31 DIAGNOSIS — Z471 Aftercare following joint replacement surgery: Secondary | ICD-10-CM | POA: Diagnosis not present

## 2019-01-31 DIAGNOSIS — I110 Hypertensive heart disease with heart failure: Secondary | ICD-10-CM | POA: Diagnosis not present

## 2019-01-31 DIAGNOSIS — I69354 Hemiplegia and hemiparesis following cerebral infarction affecting left non-dominant side: Secondary | ICD-10-CM | POA: Diagnosis not present

## 2019-01-31 DIAGNOSIS — E1140 Type 2 diabetes mellitus with diabetic neuropathy, unspecified: Secondary | ICD-10-CM | POA: Diagnosis not present

## 2019-01-31 DIAGNOSIS — S82892D Other fracture of left lower leg, subsequent encounter for closed fracture with routine healing: Secondary | ICD-10-CM | POA: Diagnosis not present

## 2019-02-03 DIAGNOSIS — I5031 Acute diastolic (congestive) heart failure: Secondary | ICD-10-CM | POA: Diagnosis not present

## 2019-02-03 DIAGNOSIS — S82892D Other fracture of left lower leg, subsequent encounter for closed fracture with routine healing: Secondary | ICD-10-CM | POA: Diagnosis not present

## 2019-02-03 DIAGNOSIS — I110 Hypertensive heart disease with heart failure: Secondary | ICD-10-CM | POA: Diagnosis not present

## 2019-02-03 DIAGNOSIS — Z471 Aftercare following joint replacement surgery: Secondary | ICD-10-CM | POA: Diagnosis not present

## 2019-02-03 DIAGNOSIS — I69354 Hemiplegia and hemiparesis following cerebral infarction affecting left non-dominant side: Secondary | ICD-10-CM | POA: Diagnosis not present

## 2019-02-03 DIAGNOSIS — Z96652 Presence of left artificial knee joint: Secondary | ICD-10-CM | POA: Diagnosis not present

## 2019-02-05 DIAGNOSIS — Z96652 Presence of left artificial knee joint: Secondary | ICD-10-CM | POA: Diagnosis not present

## 2019-02-05 DIAGNOSIS — S82892D Other fracture of left lower leg, subsequent encounter for closed fracture with routine healing: Secondary | ICD-10-CM | POA: Diagnosis not present

## 2019-02-05 DIAGNOSIS — I5031 Acute diastolic (congestive) heart failure: Secondary | ICD-10-CM | POA: Diagnosis not present

## 2019-02-05 DIAGNOSIS — I110 Hypertensive heart disease with heart failure: Secondary | ICD-10-CM | POA: Diagnosis not present

## 2019-02-05 DIAGNOSIS — Z471 Aftercare following joint replacement surgery: Secondary | ICD-10-CM | POA: Diagnosis not present

## 2019-02-05 DIAGNOSIS — I69354 Hemiplegia and hemiparesis following cerebral infarction affecting left non-dominant side: Secondary | ICD-10-CM | POA: Diagnosis not present

## 2019-02-06 DIAGNOSIS — Z96652 Presence of left artificial knee joint: Secondary | ICD-10-CM | POA: Diagnosis not present

## 2019-02-06 DIAGNOSIS — Z471 Aftercare following joint replacement surgery: Secondary | ICD-10-CM | POA: Diagnosis not present

## 2019-02-07 ENCOUNTER — Ambulatory Visit: Payer: Medicare Other | Admitting: Physical Therapy

## 2019-02-07 DIAGNOSIS — I5031 Acute diastolic (congestive) heart failure: Secondary | ICD-10-CM | POA: Diagnosis not present

## 2019-02-07 DIAGNOSIS — I69354 Hemiplegia and hemiparesis following cerebral infarction affecting left non-dominant side: Secondary | ICD-10-CM | POA: Diagnosis not present

## 2019-02-07 DIAGNOSIS — Z471 Aftercare following joint replacement surgery: Secondary | ICD-10-CM | POA: Diagnosis not present

## 2019-02-07 DIAGNOSIS — I110 Hypertensive heart disease with heart failure: Secondary | ICD-10-CM | POA: Diagnosis not present

## 2019-02-07 DIAGNOSIS — Z96652 Presence of left artificial knee joint: Secondary | ICD-10-CM | POA: Diagnosis not present

## 2019-02-07 DIAGNOSIS — S82892D Other fracture of left lower leg, subsequent encounter for closed fracture with routine healing: Secondary | ICD-10-CM | POA: Diagnosis not present

## 2019-02-10 DIAGNOSIS — I110 Hypertensive heart disease with heart failure: Secondary | ICD-10-CM | POA: Diagnosis not present

## 2019-02-10 DIAGNOSIS — I69354 Hemiplegia and hemiparesis following cerebral infarction affecting left non-dominant side: Secondary | ICD-10-CM | POA: Diagnosis not present

## 2019-02-10 DIAGNOSIS — S82892D Other fracture of left lower leg, subsequent encounter for closed fracture with routine healing: Secondary | ICD-10-CM | POA: Diagnosis not present

## 2019-02-10 DIAGNOSIS — Z96652 Presence of left artificial knee joint: Secondary | ICD-10-CM | POA: Diagnosis not present

## 2019-02-10 DIAGNOSIS — I5031 Acute diastolic (congestive) heart failure: Secondary | ICD-10-CM | POA: Diagnosis not present

## 2019-02-10 DIAGNOSIS — Z471 Aftercare following joint replacement surgery: Secondary | ICD-10-CM | POA: Diagnosis not present

## 2019-02-12 ENCOUNTER — Encounter: Payer: Self-pay | Admitting: Physical Therapy

## 2019-02-13 DIAGNOSIS — I69354 Hemiplegia and hemiparesis following cerebral infarction affecting left non-dominant side: Secondary | ICD-10-CM | POA: Diagnosis not present

## 2019-02-13 DIAGNOSIS — Z471 Aftercare following joint replacement surgery: Secondary | ICD-10-CM | POA: Diagnosis not present

## 2019-02-13 DIAGNOSIS — I110 Hypertensive heart disease with heart failure: Secondary | ICD-10-CM | POA: Diagnosis not present

## 2019-02-13 DIAGNOSIS — Z96652 Presence of left artificial knee joint: Secondary | ICD-10-CM | POA: Diagnosis not present

## 2019-02-13 DIAGNOSIS — I5031 Acute diastolic (congestive) heart failure: Secondary | ICD-10-CM | POA: Diagnosis not present

## 2019-02-13 DIAGNOSIS — S82892D Other fracture of left lower leg, subsequent encounter for closed fracture with routine healing: Secondary | ICD-10-CM | POA: Diagnosis not present

## 2019-02-14 ENCOUNTER — Encounter: Payer: Self-pay | Admitting: Physical Therapy

## 2019-02-17 ENCOUNTER — Telehealth: Payer: Self-pay

## 2019-02-17 DIAGNOSIS — I5031 Acute diastolic (congestive) heart failure: Secondary | ICD-10-CM | POA: Diagnosis not present

## 2019-02-17 DIAGNOSIS — I69354 Hemiplegia and hemiparesis following cerebral infarction affecting left non-dominant side: Secondary | ICD-10-CM | POA: Diagnosis not present

## 2019-02-17 DIAGNOSIS — S82892D Other fracture of left lower leg, subsequent encounter for closed fracture with routine healing: Secondary | ICD-10-CM | POA: Diagnosis not present

## 2019-02-17 DIAGNOSIS — Z471 Aftercare following joint replacement surgery: Secondary | ICD-10-CM | POA: Diagnosis not present

## 2019-02-17 DIAGNOSIS — Z96652 Presence of left artificial knee joint: Secondary | ICD-10-CM | POA: Diagnosis not present

## 2019-02-17 DIAGNOSIS — I110 Hypertensive heart disease with heart failure: Secondary | ICD-10-CM | POA: Diagnosis not present

## 2019-02-17 NOTE — Telephone Encounter (Signed)
Pt informed. (She was happy with her self for remembering).  Karen Jackson, CMA

## 2019-02-17 NOTE — Telephone Encounter (Signed)
Patient calls nurse line wanting clarification on how she should be taking Metoprolol. Patient stated she "recalled" PCP telling her (1/2) tab 2x daily. However, when she was discharged after surgery they told her to take only (1/2) tab 1x daily. Please advise.

## 2019-02-17 NOTE — Telephone Encounter (Signed)
Patient should be taking 1/2 tab twice a day.  If she feels this is causing her any symptoms she shouldcontact [place]  If she can check her pulse she should it should be between 60-80 if not let usknow  Thanks  [employer]

## 2019-02-20 DIAGNOSIS — I69354 Hemiplegia and hemiparesis following cerebral infarction affecting left non-dominant side: Secondary | ICD-10-CM | POA: Diagnosis not present

## 2019-02-20 DIAGNOSIS — Z471 Aftercare following joint replacement surgery: Secondary | ICD-10-CM | POA: Diagnosis not present

## 2019-02-20 DIAGNOSIS — I110 Hypertensive heart disease with heart failure: Secondary | ICD-10-CM | POA: Diagnosis not present

## 2019-02-20 DIAGNOSIS — Z96652 Presence of left artificial knee joint: Secondary | ICD-10-CM | POA: Diagnosis not present

## 2019-02-20 DIAGNOSIS — I5031 Acute diastolic (congestive) heart failure: Secondary | ICD-10-CM | POA: Diagnosis not present

## 2019-02-20 DIAGNOSIS — S82892D Other fracture of left lower leg, subsequent encounter for closed fracture with routine healing: Secondary | ICD-10-CM | POA: Diagnosis not present

## 2019-02-24 DIAGNOSIS — I110 Hypertensive heart disease with heart failure: Secondary | ICD-10-CM | POA: Diagnosis not present

## 2019-02-24 DIAGNOSIS — I5031 Acute diastolic (congestive) heart failure: Secondary | ICD-10-CM | POA: Diagnosis not present

## 2019-02-24 DIAGNOSIS — Z471 Aftercare following joint replacement surgery: Secondary | ICD-10-CM | POA: Diagnosis not present

## 2019-02-24 DIAGNOSIS — I69354 Hemiplegia and hemiparesis following cerebral infarction affecting left non-dominant side: Secondary | ICD-10-CM | POA: Diagnosis not present

## 2019-02-24 DIAGNOSIS — S82892D Other fracture of left lower leg, subsequent encounter for closed fracture with routine healing: Secondary | ICD-10-CM | POA: Diagnosis not present

## 2019-02-24 DIAGNOSIS — Z96652 Presence of left artificial knee joint: Secondary | ICD-10-CM | POA: Diagnosis not present

## 2019-02-25 DIAGNOSIS — S82892D Other fracture of left lower leg, subsequent encounter for closed fracture with routine healing: Secondary | ICD-10-CM | POA: Diagnosis not present

## 2019-02-25 DIAGNOSIS — I69354 Hemiplegia and hemiparesis following cerebral infarction affecting left non-dominant side: Secondary | ICD-10-CM | POA: Diagnosis not present

## 2019-02-25 DIAGNOSIS — Z96652 Presence of left artificial knee joint: Secondary | ICD-10-CM | POA: Diagnosis not present

## 2019-02-25 DIAGNOSIS — I110 Hypertensive heart disease with heart failure: Secondary | ICD-10-CM | POA: Diagnosis not present

## 2019-02-25 DIAGNOSIS — Z471 Aftercare following joint replacement surgery: Secondary | ICD-10-CM | POA: Diagnosis not present

## 2019-02-25 DIAGNOSIS — I5031 Acute diastolic (congestive) heart failure: Secondary | ICD-10-CM | POA: Diagnosis not present

## 2019-03-03 ENCOUNTER — Ambulatory Visit: Payer: Medicare Other | Attending: Orthopedic Surgery | Admitting: Physical Therapy

## 2019-03-03 ENCOUNTER — Encounter: Payer: Self-pay | Admitting: Physical Therapy

## 2019-03-03 ENCOUNTER — Other Ambulatory Visit: Payer: Self-pay

## 2019-03-03 DIAGNOSIS — M25572 Pain in left ankle and joints of left foot: Secondary | ICD-10-CM | POA: Insufficient documentation

## 2019-03-03 DIAGNOSIS — I69354 Hemiplegia and hemiparesis following cerebral infarction affecting left non-dominant side: Secondary | ICD-10-CM

## 2019-03-03 DIAGNOSIS — R2242 Localized swelling, mass and lump, left lower limb: Secondary | ICD-10-CM | POA: Diagnosis not present

## 2019-03-03 DIAGNOSIS — I6990 Unspecified sequelae of unspecified cerebrovascular disease: Secondary | ICD-10-CM | POA: Diagnosis not present

## 2019-03-03 DIAGNOSIS — M6281 Muscle weakness (generalized): Secondary | ICD-10-CM

## 2019-03-03 DIAGNOSIS — M25562 Pain in left knee: Secondary | ICD-10-CM

## 2019-03-03 DIAGNOSIS — I6930 Unspecified sequelae of cerebral infarction: Secondary | ICD-10-CM

## 2019-03-03 DIAGNOSIS — M25662 Stiffness of left knee, not elsewhere classified: Secondary | ICD-10-CM | POA: Diagnosis not present

## 2019-03-03 DIAGNOSIS — R262 Difficulty in walking, not elsewhere classified: Secondary | ICD-10-CM | POA: Diagnosis not present

## 2019-03-03 NOTE — Therapy (Signed)
[address] [place], [place], [address] Phone: [phone]   Fax:  [phone]  Physical Therapy Evaluation  Patient Details  Name: Karen Jackson MRN: [id number] Date of Birth: 1940/09/05 Referring Provider (PT): Lucey   Encounter Date: 03/03/2019  PT End of Session - 03/03/19 1231    Visit Number  1    Date for PT Re-Evaluation  05/04/19    PT Start Time  1058    PT Stop Time  1144    PT Time Calculation (min)  46 min    Activity Tolerance  Patient tolerated treatment well    Behavior During Therapy  [facility] for tasks assessed/performed       Past Medical History:  Diagnosis Date  . Acute cystitis without hematuria   . Acute diastolic CHF (congestive heart failure) ([place])   . Arthritis   . Dyspnea   . Dysrhythmia   . Fever of unknown origin 03/19/2017  . Hyperlipidemia   . Hypertension    denies at preop  . Multifocal pneumonia   . Neuromuscular disorder ([place])    neuropathy left arm and foot  . Osteopenia   . Paralysis ([place])    partial left side from CVA   . Persistent atrial fibrillation   . PONV (postoperative nausea and vomiting)   . Pre-diabetes   . Stroke [facility]) 2013   hemmorahgic    Past Surgical History:  Procedure Laterality Date  . ANKLE SURGERY    . APPENDECTOMY    . CHOLECYSTECTOMY    . HERNIA REPAIR     Esophagus  . JOINT REPLACEMENT     total- right partial- left  . MASTECTOMY PARTIAL / LUMPECTOMY  2012   left  . ORIF ANKLE FRACTURE Left 07/20/2018   Procedure: OPEN REDUCTION INTERNAL FIXATION (ORIF) ANKLE FRACTURE;  Surgeon: Wylene Simmer, MD;  Location: [place];  Service: Orthopedics;  Laterality: Left;  . TOTAL KNEE ARTHROPLASTY Left 01/27/2019   Procedure: TOTAL KNEE ARTHROPLASTY;  Surgeon: Vickey Huger, MD;  [facility];  Service: Orthopedics;  Laterality: Left;    There were no vitals filed for this visit.   Subjective Assessment - 03/03/19 1104    Subjective   Patient underrwent a left TKR on 01/27/19.  She went home and was having some HHPT.  She has a history in November of left ORIF and CVA with left hemiplegia, this all compounds her function.  Whe was walking in October with a walker prior to the ankle injury.  Last fall I could walk with her 250 feet    Pertinent History  CVA, right TKR, CVA, right ankle replacement    Limitations  Lifting;Standing;Walking;House hold activities    Patient Stated Goals  walk again with walker, transfer with minimal help    Currently in Pain?  Yes    Pain Score  4     Pain Location  Knee    Pain Orientation  Left    Pain Descriptors / Indicators  Sore    Pain Onset  More than a month ago    Pain Frequency  Constant    Aggravating Factors   standing, trying to walk  pain up to 6/10    Pain Relieving Factors  rest    Effect of Pain on Daily Activities  just sore         [facility] PT Assessment - 03/03/19 0001      Assessment   Medical Diagnosis  s/p left TKR, weakness, difficulty walking    Referring Provider (PT)  Lucey    Onset Date/Surgical Date  01/27/19    Prior Therapy  HHPT      Balance Screen   Has the patient fallen in the past 6 months  Yes    How many times?  1    Has the patient had a decrease in activity level because of a fear of falling?   Yes    Is the patient reluctant to leave their home because of a fear of falling?   Yes      Home Environment   Additional Comments  ramp into the home, has an aide come in for 10 hours a day, lives alone, has W/C, grab bars and shower chair      Prior Function   Level of Independence  Independent with household mobility with device;Needs assistance with ADLs;Needs assistance with homemaking    Vocation  Retired      AROM   Left Knee Extension  4    Left Knee Flexion  90    Left Ankle Dorsiflexion  5    Left Ankle Plantar Flexion  20    Left Ankle Inversion  5    Left Ankle Eversion  0      Strength   Right/Left Knee  Left    Left Knee Flexion   3-/5    Left Knee Extension  3-/5    Right/Left Ankle  Left    Left Ankle Dorsiflexion  3-/5    Left Ankle Plantar Flexion  3-/5    Left Ankle Inversion  2+/5    Left Ankle Eversion  0/5      Palpation   Palpation comment  no warmth, scar is well healed, scar is mobile      Transfers   Comments  With a grab bar, I used the weight car as the grab bar she can transfer with min A/CGA, without the grab bar and using HHA she requires Mod A with a lot of cues, she tends to lean back and pull away, she needs cues verbal and tactile at times for the left foot placement as she tends to roll that ankle and needs cues to put weight on the left side.  A first transfer from the car to the wheelchair was not good, she would not bear weight on the left leg, when she did the ankle would roll an dshe continued to pull back and away      Ambulation/Gait   Gait Comments  did not attempt as I felt like the best benefit today would be to get her moving, but at this time it appears that she cannot walk as she would not put weight on the left leg and cannot advance and walker on her own.                Objective measurements completed on examination: See above findings.      [facility] Adult PT Treatment/Exercise - 03/03/19 0001      Knee/Hip Exercises: Aerobic   Nustep  level 2 x 6 minutes               PT Short Term Goals - 03/03/19 1238      PT SHORT TERM GOAL #1   Title  independent with initial HEP    Time  2    Period  Weeks    Status  New        PT  Long Term Goals - 03/03/19 1238      PT LONG TERM GOAL #1   Title  walk with hand hold assist x 100 feet    Time  12    Period  Weeks    Status  New      PT LONG TERM GOAL #2   Title  increased right LE strength to 4-/5    Time  12    Period  Weeks    Status  New      PT LONG TERM GOAL #3   Title  100    Time  12    Period  Weeks    Status  New      PT LONG TERM GOAL #4   Title  transfer independently with set up     Time  12    Period  Weeks    Status  New      PT LONG TERM GOAL #5   Title  report no difficulty pulling her pants up when going to the bathroom    Time  12    Period  Weeks    Status  New             Plan - 03/03/19 1233    Clinical Impression Statement  Patient has multiple comorbidities that complicate her recovery from a left TKR on 01/27/19, the good thing is that prior to last fall I was able to walk with her 220-250 feet with HHA.  She currently has AROM of the left knee of 4-85 degrees, the left side is the hemiplegic side so strength is limited.  She has a lot of difficulty with transfers and is unable to walk.  She has help 10+ hours a day.  Prior to her left TKR she had a fall last fall where she sustained a left ankle fracture that required ORIF    Personal Factors and Comorbidities  Comorbidity 3+    Comorbidities  CVA, TSR, right TKR, ankle replacement, HTN    Examination-Activity Limitations  Bathing;Bed Mobility;Lift;Squat;Stairs;Locomotion Level;Stand;Toileting;Transfers;Dressing    Stability/Clinical Decision Making  Evolving/Moderate complexity    Clinical Decision Making  Moderate    Rehab Potential  Fair    PT Frequency  3x / week    PT Duration  12 weeks    PT Treatment/Interventions  ADLs/Self Care Home Management;Cryotherapy;Electrical Stimulation;Iontophoresis 4mg /ml Dexamethasone;Moist Heat;Ultrasound;Gait training;Balance training;Therapeutic exercise;Therapeutic activities;Functional mobility training;Patient/family education;Manual techniques;Dry needling    PT Next Visit Plan  Work on her transfers for safety and get her moving as this has helped int he past since she has inability to do things at home that help her motion, function and strength    Consulted and Agree with Plan of Care  Patient       Patient will benefit from skilled therapeutic intervention in order to improve the following deficits and impairments:  Abnormal gait, Decreased  coordination, Decreased range of motion, Difficulty walking, Cardiopulmonary status limiting activity, Decreased endurance, Increased muscle spasms, Impaired UE functional use, Decreased activity tolerance, Pain, Decreased balance, Postural dysfunction, Decreased strength, Decreased mobility  Visit Diagnosis: 1. Muscle weakness (generalized)   2. Difficulty in walking, not elsewhere classified   3. Late effects of CVA (cerebrovascular accident)   4. Hemiplegia and hemiparesis following cerebral infarction affecting left non-dominant side ([place])   5. Acute pain of left knee   6. Stiffness of left knee, not elsewhere classified        Problem List Patient Active Problem List  Diagnosis Date Noted  . S/P total knee replacement 01/27/2019  . Closed left ankle fracture 07/20/2018  . Acute ankle pain 05/23/2018  . Respiratory failure with hypoxia ([place]) 08/30/2017  . Hypoxemia   . Heart failure with preserved ejection fraction ([place]), Grade 3 diastolic dysfunction 33/43/5686  . Atrial fibrillation with RVR ([place])   . PAF (paroxysmal atrial fibrillation) ([place])   . Metabolic syndrome 16/83/7290  . Encounter for preventive health examination 02/17/2016  . Morbid obesity ([place]) 06/17/2015  . Hypomagnesemia 04/24/2014  . Hemiparesis affecting left side as late effect of cerebrovascular accident ([place]) 04/24/2014  . Nontraumatic cerebral hemorrhage ([place]) 04/30/2012  . DM (diabetes mellitus) with complications ([place]) 21/06/5519  . OSTEOPENIA 01/21/2009  . UNSPECIFIED VITAMIN D DEFICIENCY 11/19/2007  . ESSENTIAL HYPERTENSION, BENIGN 11/19/2007  . HYPERCHOLESTEROLEMIA 10/25/2006  . GASTROESOPHAGEAL REFLUX, NO ESOPHAGITIS 10/25/2006  . DIVERTICULOSIS OF COLON 10/25/2006  . Osteoarthritis 10/25/2006  . CERVICAL SPINE DISORDER, NOS 10/25/2006    Sumner Boast., PT 03/03/2019, 12:42 PM  [address] [place], [place],  [address] Phone: [phone]   Fax:  [phone]  Name: SHERRIANN SZUCH MRN: [id number] Date of Birth: 1940/12/23

## 2019-03-06 ENCOUNTER — Other Ambulatory Visit: Payer: Self-pay

## 2019-03-06 ENCOUNTER — Ambulatory Visit: Payer: Medicare Other | Admitting: Physical Therapy

## 2019-03-06 DIAGNOSIS — I6990 Unspecified sequelae of unspecified cerebrovascular disease: Secondary | ICD-10-CM | POA: Diagnosis not present

## 2019-03-06 DIAGNOSIS — R262 Difficulty in walking, not elsewhere classified: Secondary | ICD-10-CM | POA: Diagnosis not present

## 2019-03-06 DIAGNOSIS — M25662 Stiffness of left knee, not elsewhere classified: Secondary | ICD-10-CM

## 2019-03-06 DIAGNOSIS — M6281 Muscle weakness (generalized): Secondary | ICD-10-CM

## 2019-03-06 DIAGNOSIS — M25562 Pain in left knee: Secondary | ICD-10-CM | POA: Diagnosis not present

## 2019-03-06 DIAGNOSIS — M25572 Pain in left ankle and joints of left foot: Secondary | ICD-10-CM

## 2019-03-06 DIAGNOSIS — I69354 Hemiplegia and hemiparesis following cerebral infarction affecting left non-dominant side: Secondary | ICD-10-CM | POA: Diagnosis not present

## 2019-03-06 NOTE — Therapy (Signed)
[address] [place], [place], [address] Phone: [phone]   Fax:  [phone]  Physical Therapy Treatment  Patient Details  Name: Karen Jackson MRN: [id number] Date of Birth: [birth year] Referring Provider (PT): Lucey   Encounter Date: 03/06/2019  PT End of Session - 03/06/19 1206    Visit Number  2    Date for PT Re-Evaluation  05/04/19    PT Start Time  1100    PT Stop Time  1200    PT Time Calculation (min)  60 min       Past Medical History:  Diagnosis Date  . Acute cystitis without hematuria   . Acute diastolic CHF (congestive heart failure) ([place])   . Arthritis   . Dyspnea   . Dysrhythmia   . Fever of unknown origin 03/19/2017  . Hyperlipidemia   . Hypertension    denies at preop  . Multifocal pneumonia   . Neuromuscular disorder ([place])    neuropathy left arm and foot  . Osteopenia   . Paralysis ([place])    partial left side from CVA   . Persistent atrial fibrillation   . PONV (postoperative nausea and vomiting)   . Pre-diabetes   . Stroke [facility]) 2013   hemmorahgic    Past Surgical History:  Procedure Laterality Date  . ANKLE SURGERY    . APPENDECTOMY    . CHOLECYSTECTOMY    . HERNIA REPAIR     Esophagus  . JOINT REPLACEMENT     total- right partial- left  . MASTECTOMY PARTIAL / LUMPECTOMY  2012   left  . ORIF ANKLE FRACTURE Left 07/20/2018   Procedure: OPEN REDUCTION INTERNAL FIXATION (ORIF) ANKLE FRACTURE;  Surgeon: Wylene Simmer, MD;  Location: [facility];  Service: Orthopedics;  Laterality: Left;  . TOTAL KNEE ARTHROPLASTY Left 01/27/2019   Procedure: TOTAL KNEE ARTHROPLASTY;  Surgeon: Vickey Huger, MD;  [facility];  Service: Orthopedics;  Laterality: Left;    There were no vitals filed for this visit.  Subjective Assessment - 03/06/19 1155    Subjective  ankle is rolling alot today. good days and bad days    Pain Location  Knee    Pain Orientation  Left                        [facility] Adult PT Treatment/Exercise - 03/06/19 0001      Transfers   Transfers  Stand Pivot Transfers    Transfer Cueing  mod cuing    Comments  transfers to Nustep,mat,w/c and car- min -max A increased as she fatigued      Ambulation/Gait   Pre-Gait Activities  stood with RW min A, worked on static standing 1 min 2 times   added hip flex straight knee and bent 5 times each BIL,   Gait Comments  multi attempts to amb with RW ( 3 times)and with HHA. ( 3 x) needed mod A, cuing and assistance to stab left ankle from rolling   max steps were 8. pt very fearfull and leans back     Knee/Hip Exercises: Aerobic   Nustep  L 3 6 min   mod A transfer     Knee/Hip Exercises: Seated   Long Arc Quad  Strengthening;Left;3 sets;10 reps   TKE hold   Clamshell with TheraBand  Red    Marching  Strengthening;Left;2 sets;10 reps   red tband   Hamstring Curl  Strengthening;Left;2  sets;10 reps   red tband     Manual Therapy   Manual Therapy  Passive ROM    Passive ROM  flex/ext Left knee               PT Short Term Goals - 03/03/19 1238      PT SHORT TERM GOAL #1   Title  independent with initial HEP    Time  2    Period  Weeks    Status  New        PT Long Term Goals - 03/03/19 1238      PT LONG TERM GOAL #1   Title  walk with hand hold assist x 100 feet    Time  12    Period  Weeks    Status  New      PT LONG TERM GOAL #2   Title  increased right LE strength to 4-/5    Time  12    Period  Weeks    Status  New      PT LONG TERM GOAL #3   Title  100    Time  12    Period  Weeks    Status  New      PT LONG TERM GOAL #4   Title  transfer independently with set up    Time  12    Period  Weeks    Status  New      PT LONG TERM GOAL #5   Title  report no difficulty pulling her pants up when going to the bathroom    Time  12    Period  Weeks    Status  New            Plan - 03/06/19 1206    Clinical Impression Statement   pt with good ROM of left knee and strength improving, tolerated red well and focus on TKE with LAQ. Pt still very fearful of falling which at time limits ability to perform. Pt leans backward and is flexed thru LE. Asked pt to bring her walker next session to help with better height. Assistance with transfers increased as pt fatigued.    PT Treatment/Interventions  ADLs/Self Care Home Management;Cryotherapy;Electrical Stimulation;Iontophoresis 4mg /ml Dexamethasone;Moist Heat;Ultrasound;Gait training;Balance training;Therapeutic exercise;Therapeutic activities;Functional mobility training;Patient/family education;Manual techniques;Dry needling    PT Next Visit Plan  Work on her transfers for safety and get her moving as this has helped int he past since she has inability to do things at home that help her motion, function and strength       Patient will benefit from skilled therapeutic intervention in order to improve the following deficits and impairments:  Abnormal gait, Decreased coordination, Decreased range of motion, Difficulty walking, Cardiopulmonary status limiting activity, Decreased endurance, Increased muscle spasms, Impaired UE functional use, Decreased activity tolerance, Pain, Decreased balance, Postural dysfunction, Decreased strength, Decreased mobility  Visit Diagnosis: 1. Muscle weakness (generalized)   2. Difficulty in walking, not elsewhere classified   3. Stiffness of left knee, not elsewhere classified   4. Pain in left ankle and joints of left foot        Problem List Patient Active Problem List   Diagnosis Date Noted  . S/P total knee replacement 01/27/2019  . Closed left ankle fracture 07/20/2018  . Acute ankle pain 05/23/2018  . Respiratory failure with hypoxia ([place]) 08/30/2017  . Hypoxemia   . Heart failure with preserved ejection fraction ([place]), Grade 3 diastolic dysfunction 67/07/4579  .  Atrial fibrillation with RVR ([place])   . PAF (paroxysmal atrial  fibrillation) ([place])   . Metabolic syndrome 19/91/4445  . Encounter for preventive health examination 02/17/2016  . Morbid obesity ([place]) 06/17/2015  . Hypomagnesemia 04/24/2014  . Hemiparesis affecting left side as late effect of cerebrovascular accident ([place]) 04/24/2014  . Nontraumatic cerebral hemorrhage ([place]) 04/30/2012  . DM (diabetes mellitus) with complications ([place]) 84/83/5075  . OSTEOPENIA 01/21/2009  . UNSPECIFIED VITAMIN D DEFICIENCY 11/19/2007  . ESSENTIAL HYPERTENSION, BENIGN 11/19/2007  . HYPERCHOLESTEROLEMIA 10/25/2006  . GASTROESOPHAGEAL REFLUX, NO ESOPHAGITIS 10/25/2006  . DIVERTICULOSIS OF COLON 10/25/2006  . Osteoarthritis 10/25/2006  . CERVICAL SPINE DISORDER, NOS 10/25/2006    ,ANGIE PTA 03/06/2019, 12:10 PM  [address] [place], [place], [address] Phone: [phone]   Fax:  [phone]  Name: Karen Jackson MRN: [id number] Date of Birth: 08/20/1941

## 2019-03-10 ENCOUNTER — Other Ambulatory Visit: Payer: Self-pay

## 2019-03-10 ENCOUNTER — Encounter: Payer: Self-pay | Admitting: Physical Therapy

## 2019-03-10 ENCOUNTER — Ambulatory Visit: Payer: Medicare Other | Admitting: Physical Therapy

## 2019-03-10 DIAGNOSIS — M25662 Stiffness of left knee, not elsewhere classified: Secondary | ICD-10-CM | POA: Diagnosis not present

## 2019-03-10 DIAGNOSIS — M25562 Pain in left knee: Secondary | ICD-10-CM | POA: Diagnosis not present

## 2019-03-10 DIAGNOSIS — I6990 Unspecified sequelae of unspecified cerebrovascular disease: Secondary | ICD-10-CM

## 2019-03-10 DIAGNOSIS — M6281 Muscle weakness (generalized): Secondary | ICD-10-CM

## 2019-03-10 DIAGNOSIS — R2242 Localized swelling, mass and lump, left lower limb: Secondary | ICD-10-CM

## 2019-03-10 DIAGNOSIS — R262 Difficulty in walking, not elsewhere classified: Secondary | ICD-10-CM

## 2019-03-10 DIAGNOSIS — I69354 Hemiplegia and hemiparesis following cerebral infarction affecting left non-dominant side: Secondary | ICD-10-CM | POA: Diagnosis not present

## 2019-03-10 DIAGNOSIS — M25572 Pain in left ankle and joints of left foot: Secondary | ICD-10-CM

## 2019-03-10 NOTE — Therapy (Signed)
[place], [place], [address] Phone: [phone]   Fax:  [phone]  Physical Therapy Treatment  Patient Details  Name: Karen Jackson MRN: [id number] Date of Birth: 26-May-1941 Referring Provider (PT): Lucey   Encounter Date: 03/10/2019  PT End of Session - 03/10/19 1148    Visit Number  3    Date for PT Re-Evaluation  05/04/19    PT Start Time  1054    PT Stop Time  1145    PT Time Calculation (min)  51 min    Activity Tolerance  Patient tolerated treatment well    Behavior During Therapy  [facility] for tasks assessed/performed       Past Medical History:  Diagnosis Date  . Acute cystitis without hematuria   . Acute diastolic CHF (congestive heart failure) ([place])   . Arthritis   . Dyspnea   . Dysrhythmia   . Fever of unknown origin 03/19/2017  . Hyperlipidemia   . Hypertension    denies at preop  . Multifocal pneumonia   . Neuromuscular disorder ([place])    neuropathy left arm and foot  . Osteopenia   . Paralysis ([place])    partial left side from CVA   . Persistent atrial fibrillation   . PONV (postoperative nausea and vomiting)   . Pre-diabetes   . Stroke [facility]) 2013   hemmorahgic    Past Surgical History:  Procedure Laterality Date  . ANKLE SURGERY    . APPENDECTOMY    . CHOLECYSTECTOMY    . HERNIA REPAIR     Esophagus  . JOINT REPLACEMENT     total- right partial- left  . MASTECTOMY PARTIAL / LUMPECTOMY  2012   left  . ORIF ANKLE FRACTURE Left 07/20/2018   Procedure: OPEN REDUCTION INTERNAL FIXATION (ORIF) ANKLE FRACTURE;  Surgeon: Wylene Simmer, MD;  Location: [place];  Service: Orthopedics;  Laterality: Left;  . TOTAL KNEE ARTHROPLASTY Left 01/27/2019   Procedure: TOTAL KNEE ARTHROPLASTY;  Surgeon: Vickey Huger, MD;  [facility];  Service: Orthopedics;  Laterality: Left;    There were no vitals filed for this visit.  Subjective Assessment - 03/10/19 1105    Subjective   Patient again reports issues with the left ankle and difficulty with transfers    Currently in Pain?  Yes    Pain Score  3     Pain Location  Knee    Pain Orientation  Left                       [facility] Adult PT Treatment/Exercise - 03/10/19 0001      Transfers   Comments  Worked a lot on standing, had to use my feet to block her left knee and her left ankle, worked a lot on having correct foot placement, she is rolling really badly today with any weight bearing, seems to be some spasticity that inverts and goes into PF      Ambulation/Gait   Gait Comments  practiced standing 40 seconds at a time, with some weight shift       Knee/Hip Exercises: Aerobic   Nustep  L 3 6 min      Knee/Hip Exercises: Seated   Long Arc Quad  Strengthening;Left;3 sets;10 reps    Long Arc Quad Weight  5 lbs.    Other Seated Knee/Hip Exercises  PF/DF ankle red tband 2x15, then some PROM of the left ankle  with gentle approximation, yellow tband left shoulder retraction, also worked [name] he left leg working on her pushing me away for some approximation simulating a leg press    Hamstring Curl  Left;3 sets;10 reps    Hamstring Limitations  red tband      Manual Therapy   Manual Therapy  Passive ROM    Passive ROM  motion of the knee and then with pressure through the ankle for eversion to help with not rolling ankle               PT Short Term Goals - 03/03/19 1238      PT SHORT TERM GOAL #1   Title  independent with initial HEP    Time  2    Period  Weeks    Status  New        PT Long Term Goals - 03/03/19 1238      PT LONG TERM GOAL #1   Title  walk with hand hold assist x 100 feet    Time  12    Period  Weeks    Status  New      PT LONG TERM GOAL #2   Title  increased right LE strength to 4-/5    Time  12    Period  Weeks    Status  New      PT LONG TERM GOAL #3   Title  100    Time  12    Period  Weeks    Status  New      PT LONG TERM GOAL #4   Title   transfer independently with set up    Time  12    Period  Weeks    Status  New      PT LONG TERM GOAL #5   Title  report no difficulty pulling her pants up when going to the bathroom    Time  12    Period  Weeks    Status  New            Plan - 03/10/19 1149    Clinical Impression Statement  Patient is having a lot of difficulty with the left ankle rolling, this iscausing her not to be able to transfer safely and unable to walk, I inspected the ankle brace it is worn out and I asked her to get a new one, I tried to use tband to help brace the ankle into a neutral posistion to allow weight bearing.  This was not able to help. I had to use both of my feet to achieve the ankle not rolling, she was able to correct posture some but needed a lot of cues to straighten knees, unable to attempt walking today due to the ankle rolling.    Examination-Activity Limitations  Bathing;Bed Mobility;Lift;Squat;Stairs;Locomotion Level;Stand;Toileting;Transfers;Dressing    PT Next Visit Plan  asked her and her caregive to work on ankle eversion    Consulted and Agree with Plan of Care  Patient       Patient will benefit from skilled therapeutic intervention in order to improve the following deficits and impairments:  Abnormal gait, Decreased coordination, Decreased range of motion, Difficulty walking, Cardiopulmonary status limiting activity, Decreased endurance, Increased muscle spasms, Impaired UE functional use, Decreased activity tolerance, Pain, Decreased balance, Postural dysfunction, Decreased strength, Decreased mobility  Visit Diagnosis: 1. Muscle weakness (generalized)   2. Difficulty in walking, not elsewhere classified   3. Stiffness of left knee, not elsewhere  classified   4. Pain in left ankle and joints of left foot   5. Late effects of CVA (cerebrovascular accident)   6. Localized swelling, mass and lump, left lower limb        Problem List Patient Active Problem List    Diagnosis Date Noted  . S/P total knee replacement 01/27/2019  . Closed left ankle fracture 07/20/2018  . Acute ankle pain 05/23/2018  . Respiratory failure with hypoxia ([place]) 08/30/2017  . Hypoxemia   . Heart failure with preserved ejection fraction ([place]), Grade 3 diastolic dysfunction 85/90/9311  . Atrial fibrillation with RVR ([place])   . PAF (paroxysmal atrial fibrillation) ([place])   . Metabolic syndrome 21/62/4469  . Encounter for preventive health examination 02/17/2016  . Morbid obesity ([place]) 06/17/2015  . Hypomagnesemia 04/24/2014  . Hemiparesis affecting left side as late effect of cerebrovascular accident ([place]) 04/24/2014  . Nontraumatic cerebral hemorrhage ([place]) 04/30/2012  . DM (diabetes mellitus) with complications ([place]) 50/72/2575  . OSTEOPENIA 01/21/2009  . UNSPECIFIED VITAMIN D DEFICIENCY 11/19/2007  . ESSENTIAL HYPERTENSION, BENIGN 11/19/2007  . HYPERCHOLESTEROLEMIA 10/25/2006  . GASTROESOPHAGEAL REFLUX, NO ESOPHAGITIS 10/25/2006  . DIVERTICULOSIS OF COLON 10/25/2006  . Osteoarthritis 10/25/2006  . CERVICAL SPINE DISORDER, NOS 10/25/2006    Sumner Boast., PT 03/10/2019, 11:55 AM  [place] [address] [place], [place], [address] Phone: [phone]   Fax:  [phone]  Name: Karen Jackson MRN: [id number] Date of Birth: 1940-12-08

## 2019-03-12 ENCOUNTER — Other Ambulatory Visit: Payer: Self-pay

## 2019-03-12 ENCOUNTER — Ambulatory Visit: Payer: Medicare Other | Admitting: Physical Therapy

## 2019-03-12 ENCOUNTER — Encounter: Payer: Self-pay | Admitting: Physical Therapy

## 2019-03-12 DIAGNOSIS — R2242 Localized swelling, mass and lump, left lower limb: Secondary | ICD-10-CM

## 2019-03-12 DIAGNOSIS — R262 Difficulty in walking, not elsewhere classified: Secondary | ICD-10-CM | POA: Diagnosis not present

## 2019-03-12 DIAGNOSIS — M25572 Pain in left ankle and joints of left foot: Secondary | ICD-10-CM

## 2019-03-12 DIAGNOSIS — M6281 Muscle weakness (generalized): Secondary | ICD-10-CM | POA: Diagnosis not present

## 2019-03-12 DIAGNOSIS — M25562 Pain in left knee: Secondary | ICD-10-CM | POA: Diagnosis not present

## 2019-03-12 DIAGNOSIS — I69354 Hemiplegia and hemiparesis following cerebral infarction affecting left non-dominant side: Secondary | ICD-10-CM | POA: Diagnosis not present

## 2019-03-12 DIAGNOSIS — M25662 Stiffness of left knee, not elsewhere classified: Secondary | ICD-10-CM

## 2019-03-12 DIAGNOSIS — I6990 Unspecified sequelae of unspecified cerebrovascular disease: Secondary | ICD-10-CM | POA: Diagnosis not present

## 2019-03-12 NOTE — Therapy (Signed)
[place], [place], [address] Phone: [phone]   Fax:  [phone]  Physical Therapy Treatment  Patient Details  Name: Karen Jackson MRN: [id number] Date of Birth: 15-Sep-1940 Referring Provider (PT): Lucey   Encounter Date: 03/12/2019  PT End of Session - 03/12/19 1153    Visit Number  4    Date for PT Re-Evaluation  05/04/19    PT Start Time  1056    PT Stop Time  1144    PT Time Calculation (min)  48 min    Activity Tolerance  Patient tolerated treatment well    Behavior During Therapy  [facility] for tasks assessed/performed       Past Medical History:  Diagnosis Date  . Acute cystitis without hematuria   . Acute diastolic CHF (congestive heart failure) ([place])   . Arthritis   . Dyspnea   . Dysrhythmia   . Fever of unknown origin 03/19/2017  . Hyperlipidemia   . Hypertension    denies at preop  . Multifocal pneumonia   . Neuromuscular disorder ([place])    neuropathy left arm and foot  . Osteopenia   . Paralysis ([place])    partial left side from CVA   . Persistent atrial fibrillation   . PONV (postoperative nausea and vomiting)   . Pre-diabetes   . Stroke [facility]) 2013   hemmorahgic    Past Surgical History:  Procedure Laterality Date  . ANKLE SURGERY    . APPENDECTOMY    . CHOLECYSTECTOMY    . HERNIA REPAIR     Esophagus  . JOINT REPLACEMENT     total- right partial- left  . MASTECTOMY PARTIAL / LUMPECTOMY  2012   left  . ORIF ANKLE FRACTURE Left 07/20/2018   Procedure: OPEN REDUCTION INTERNAL FIXATION (ORIF) ANKLE FRACTURE;  Surgeon: Wylene Simmer, MD;  Location: [place];  Service: Orthopedics;  Laterality: Left;  . TOTAL KNEE ARTHROPLASTY Left 01/27/2019   Procedure: TOTAL KNEE ARTHROPLASTY;  Surgeon: Vickey Huger, MD;  [facility];  Service: Orthopedics;  Laterality: Left;    There were no vitals filed for this visit.  Subjective Assessment - 03/12/19 1107    Subjective   Patient and her caregiver report that the ankle was rolling and they did not do well with transfer today    Currently in Pain?  Yes    Pain Score  4     Pain Location  Knee    Pain Orientation  Left                       [facility] Adult PT Treatment/Exercise - 03/12/19 0001      Transfers   Comments  Worked a lot on standing, had to use my feet to block her left knee and her left ankle, worked a lot on having correct foot placement, she is rolling really badly today with any weight bearing, seems to be some spasticity that inverts and goes into PF, changed to her pulling up on the weight cart almost like a grab bar in her bathroom, she did much better with this.      Knee/Hip Exercises: Aerobic   Nustep  L 7 6 min      Knee/Hip Exercises: Seated   Long Arc Quad  Strengthening;Left;3 sets;10 reps    Long Arc Quad Weight  5 lbs.    Other Seated Knee/Hip Exercises  rigged up an 8" step  and the fitter with 1 black and 1 blue band to have her push away for knee extnesion this seemed to work well.    Other Seated Knee/Hip Exercises  PF/DF ankle red tband 2x15, then some PROM of the left ankle with gentle approximation, yellow tband left shoulder retraction, also worked [name] he left leg working on her pushing me away for some approximation simulating a leg press    Hamstring Curl  Left;3 sets;10 reps    Hamstring Limitations  red tband      Manual Therapy   Manual Therapy  Passive ROM    Passive ROM  motion of the knee and then with pressure through the ankle for eversion to help with not rolling ankle             PT Education - 03/12/19 1108    Education Details  looked up online and gave information on ankle braces that may help her more to limit the rolling    Person(s) Educated  Patient    Methods  Explanation;Demonstration;Handout    Comprehension  Verbalized understanding       PT Short Term Goals - 03/03/19 1238      PT SHORT TERM GOAL #1   Title  independent  with initial HEP    Time  2    Period  Weeks    Status  New        PT Long Term Goals - 03/03/19 1238      PT LONG TERM GOAL #1   Title  walk with hand hold assist x 100 feet    Time  12    Period  Weeks    Status  New      PT LONG TERM GOAL #2   Title  increased right LE strength to 4-/5    Time  12    Period  Weeks    Status  New      PT LONG TERM GOAL #3   Title  100    Time  12    Period  Weeks    Status  New      PT LONG TERM GOAL #4   Title  transfer independently with set up    Time  12    Period  Weeks    Status  New      PT LONG TERM GOAL #5   Title  report no difficulty pulling her pants up when going to the bathroom    Time  12    Period  Weeks    Status  New            Plan - 03/12/19 1154    Clinical Impression Statement  There is a myriad of issues that have to be worked out, the CVA, the ankle, her short stature, so many things that do not work on most have to be adjusted, I feel like her pulling up on the wieght cart may be our best bet to get her bearing weight on the left, as she cannot do with a walker and really struggles with HHA.  I gave info about the ankle brace and she sais she will order today    PT Next Visit Plan  work on standing and weight bearing with her using the weight cart to pull up on    Consulted and Agree with Plan of Care  Patient       Patient will benefit from skilled therapeutic intervention in order to improve  the following deficits and impairments:  Abnormal gait, Decreased coordination, Decreased range of motion, Difficulty walking, Cardiopulmonary status limiting activity, Decreased endurance, Increased muscle spasms, Impaired UE functional use, Decreased activity tolerance, Pain, Decreased balance, Postural dysfunction, Decreased strength, Decreased mobility  Visit Diagnosis: 1. Muscle weakness (generalized)   2. Difficulty in walking, not elsewhere classified   3. Stiffness of left knee, not elsewhere  classified   4. Pain in left ankle and joints of left foot   5. Late effects of CVA (cerebrovascular accident)   6. Localized swelling, mass and lump, left lower limb        Problem List Patient Active Problem List   Diagnosis Date Noted  . S/P total knee replacement 01/27/2019  . Closed left ankle fracture 07/20/2018  . Acute ankle pain 05/23/2018  . Respiratory failure with hypoxia ([place]) 08/30/2017  . Hypoxemia   . Heart failure with preserved ejection fraction ([place]), Grade 3 diastolic dysfunction 56/38/9373  . Atrial fibrillation with RVR ([place])   . PAF (paroxysmal atrial fibrillation) ([place])   . Metabolic syndrome 42/87/6811  . Encounter for preventive health examination 02/17/2016  . Morbid obesity ([place]) 06/17/2015  . Hypomagnesemia 04/24/2014  . Hemiparesis affecting left side as late effect of cerebrovascular accident ([place]) 04/24/2014  . Nontraumatic cerebral hemorrhage ([place]) 04/30/2012  . DM (diabetes mellitus) with complications ([place]) 57/26/2035  . OSTEOPENIA 01/21/2009  . UNSPECIFIED VITAMIN D DEFICIENCY 11/19/2007  . ESSENTIAL HYPERTENSION, BENIGN 11/19/2007  . HYPERCHOLESTEROLEMIA 10/25/2006  . GASTROESOPHAGEAL REFLUX, NO ESOPHAGITIS 10/25/2006  . DIVERTICULOSIS OF COLON 10/25/2006  . Osteoarthritis 10/25/2006  . CERVICAL SPINE DISORDER, NOS 10/25/2006    Sumner Boast., PT 03/12/2019, 11:57 AM  [place] [address] [place], [place], [address] Phone: [phone]   Fax:  [phone]  Name: RUWEYDA MACKNIGHT MRN: [id number] Date of Birth: [birth year]

## 2019-03-13 ENCOUNTER — Encounter: Payer: Medicare Other | Admitting: Physical Therapy

## 2019-03-14 ENCOUNTER — Other Ambulatory Visit: Payer: Self-pay

## 2019-03-14 ENCOUNTER — Ambulatory Visit: Payer: Medicare Other | Admitting: Physical Therapy

## 2019-03-14 ENCOUNTER — Encounter: Payer: Self-pay | Admitting: Physical Therapy

## 2019-03-14 DIAGNOSIS — M6281 Muscle weakness (generalized): Secondary | ICD-10-CM | POA: Diagnosis not present

## 2019-03-14 DIAGNOSIS — I6990 Unspecified sequelae of unspecified cerebrovascular disease: Secondary | ICD-10-CM | POA: Diagnosis not present

## 2019-03-14 DIAGNOSIS — M25562 Pain in left knee: Secondary | ICD-10-CM | POA: Diagnosis not present

## 2019-03-14 DIAGNOSIS — M25572 Pain in left ankle and joints of left foot: Secondary | ICD-10-CM

## 2019-03-14 DIAGNOSIS — M25662 Stiffness of left knee, not elsewhere classified: Secondary | ICD-10-CM

## 2019-03-14 DIAGNOSIS — R2242 Localized swelling, mass and lump, left lower limb: Secondary | ICD-10-CM

## 2019-03-14 DIAGNOSIS — R262 Difficulty in walking, not elsewhere classified: Secondary | ICD-10-CM | POA: Diagnosis not present

## 2019-03-14 DIAGNOSIS — I69354 Hemiplegia and hemiparesis following cerebral infarction affecting left non-dominant side: Secondary | ICD-10-CM | POA: Diagnosis not present

## 2019-03-14 NOTE — Therapy (Signed)
[place], [place], [address] Phone: [phone]   Fax:  [phone]  Physical Therapy Treatment  Patient Details  Name: Karen Jackson MRN: [id number] Date of Birth: [birth year] Referring Provider (PT): Lucey   Encounter Date: 03/14/2019  PT End of Session - 03/14/19 1152    Visit Number  5    Date for PT Re-Evaluation  05/04/19    PT Start Time  1055    PT Stop Time  1140    PT Time Calculation (min)  45 min    Activity Tolerance  Patient tolerated treatment well    Behavior During Therapy  [facility] for tasks assessed/performed       Past Medical History:  Diagnosis Date  . Acute cystitis without hematuria   . Acute diastolic CHF (congestive heart failure) ([place])   . Arthritis   . Dyspnea   . Dysrhythmia   . Fever of unknown origin 03/19/2017  . Hyperlipidemia   . Hypertension    denies at preop  . Multifocal pneumonia   . Neuromuscular disorder ([place])    neuropathy left arm and foot  . Osteopenia   . Paralysis ([place])    partial left side from CVA   . Persistent atrial fibrillation   . PONV (postoperative nausea and vomiting)   . Pre-diabetes   . Stroke [facility]) 2013   hemmorahgic    Past Surgical History:  Procedure Laterality Date  . ANKLE SURGERY    . APPENDECTOMY    . CHOLECYSTECTOMY    . HERNIA REPAIR     Esophagus  . JOINT REPLACEMENT     total- right partial- left  . MASTECTOMY PARTIAL / LUMPECTOMY  2012   left  . ORIF ANKLE FRACTURE Left 07/20/2018   Procedure: OPEN REDUCTION INTERNAL FIXATION (ORIF) ANKLE FRACTURE;  Surgeon: Wylene Simmer, MD;  Location: [place];  Service: Orthopedics;  Laterality: Left;  . TOTAL KNEE ARTHROPLASTY Left 01/27/2019   Procedure: TOTAL KNEE ARTHROPLASTY;  Surgeon: Vickey Huger, MD;  [facility];  Service: Orthopedics;  Laterality: Left;    There were no vitals filed for this visit.  Subjective Assessment - 03/14/19 1111    Subjective   PAtient has some weeping from the lower legs.  She got the new brace and repors that it helps with transfers    Currently in Pain?  No/denies                       [facility] Adult PT Treatment/Exercise - 03/14/19 0001      Transfers   Comments  worked again on standing from sitting with her pushing and pulling up, then worked on good posture, as she fatigues her knees buckle and she sits rapidly and unsafely, so worked on her safety      Ambulation/Gait   Gait Comments  with the new ankle brace, 2 people assist 9 feet x 2 with a lot of cues and assist      Knee/Hip Exercises: Aerobic   Nustep  L 7 6 min      Knee/Hip Exercises: Seated   Long Arc Quad  Strengthening;Left;3 sets;10 reps    Long Arc Quad Weight  5 lbs.    Other Seated Knee/Hip Exercises  left arm red tband exercises               PT Short Term Goals - 03/14/19 1154      PT  SHORT TERM GOAL #1   Title  independent with initial HEP    Status  Partially Met        PT Long Term Goals - 03/03/19 1238      PT LONG TERM GOAL #1   Title  walk with hand hold assist x 100 feet    Time  12    Period  Weeks    Status  New      PT LONG TERM GOAL #2   Title  increased right LE strength to 4-/5    Time  12    Period  Weeks    Status  New      PT LONG TERM GOAL #3   Title  100    Time  12    Period  Weeks    Status  New      PT LONG TERM GOAL #4   Title  transfer independently with set up    Time  12    Period  Weeks    Status  New      PT LONG TERM GOAL #5   Title  report no difficulty pulling her pants up when going to the bathroom    Time  12    Period  Weeks    Status  New            Plan - 03/14/19 1152    Clinical Impression Statement  The new ankle brace really helped with her transfers, much safer, much smoother, less leg buckling, we were able to walk 9 feet x2 today with 2 people assist.    PT Next Visit Plan  now with the ankle brace will work on her walking again as  she tolerates, she is having some LE weeping fluid so she is not going to do much this weekend    Consulted and Agree with Plan of Care  Patient       Patient will benefit from skilled therapeutic intervention in order to improve the following deficits and impairments:  Abnormal gait, Decreased coordination, Decreased range of motion, Difficulty walking, Cardiopulmonary status limiting activity, Decreased endurance, Increased muscle spasms, Impaired UE functional use, Decreased activity tolerance, Pain, Decreased balance, Postural dysfunction, Decreased strength, Decreased mobility  Visit Diagnosis: 1. Muscle weakness (generalized)   2. Difficulty in walking, not elsewhere classified   3. Stiffness of left knee, not elsewhere classified   4. Pain in left ankle and joints of left foot   5. Late effects of CVA (cerebrovascular accident)   6. Localized swelling, mass and lump, left lower limb        Problem List Patient Active Problem List   Diagnosis Date Noted  . S/P total knee replacement 01/27/2019  . Closed left ankle fracture 07/20/2018  . Acute ankle pain 05/23/2018  . Respiratory failure with hypoxia ([place]) 08/30/2017  . Hypoxemia   . Heart failure with preserved ejection fraction ([place]), Grade 3 diastolic dysfunction 03/47/4259  . Atrial fibrillation with RVR ([place])   . PAF (paroxysmal atrial fibrillation) ([place])   . Metabolic syndrome 56/38/7564  . Encounter for preventive health examination 02/17/2016  . Morbid obesity ([place]) 06/17/2015  . Hypomagnesemia 04/24/2014  . Hemiparesis affecting left side as late effect of cerebrovascular accident ([place]) 04/24/2014  . Nontraumatic cerebral hemorrhage ([place]) 04/30/2012  . DM (diabetes mellitus) with complications ([place]) 33/29/5188  . OSTEOPENIA 01/21/2009  . UNSPECIFIED VITAMIN D DEFICIENCY 11/19/2007  . ESSENTIAL HYPERTENSION, BENIGN 11/19/2007  . HYPERCHOLESTEROLEMIA 10/25/2006  .  GASTROESOPHAGEAL REFLUX, NO ESOPHAGITIS 10/25/2006   . DIVERTICULOSIS OF COLON 10/25/2006  . Osteoarthritis 10/25/2006  . CERVICAL SPINE DISORDER, NOS 10/25/2006    Sumner Boast., PT 03/14/2019, 11:55 AM  [place] [address] [place], [place], [address] Phone: [phone]   Fax:  [phone]  Name: SHAWNTA SCHLEGEL MRN: [id number] Date of Birth: 21-Jun-1941

## 2019-03-17 ENCOUNTER — Ambulatory Visit: Payer: Medicare Other | Admitting: Physical Therapy

## 2019-03-18 ENCOUNTER — Encounter: Payer: Self-pay | Admitting: Physical Therapy

## 2019-03-18 ENCOUNTER — Other Ambulatory Visit: Payer: Self-pay

## 2019-03-18 ENCOUNTER — Ambulatory Visit: Payer: Medicare Other | Admitting: Physical Therapy

## 2019-03-18 DIAGNOSIS — R2242 Localized swelling, mass and lump, left lower limb: Secondary | ICD-10-CM

## 2019-03-18 DIAGNOSIS — M6281 Muscle weakness (generalized): Secondary | ICD-10-CM

## 2019-03-18 DIAGNOSIS — M25562 Pain in left knee: Secondary | ICD-10-CM

## 2019-03-18 DIAGNOSIS — M25662 Stiffness of left knee, not elsewhere classified: Secondary | ICD-10-CM | POA: Diagnosis not present

## 2019-03-18 DIAGNOSIS — I69354 Hemiplegia and hemiparesis following cerebral infarction affecting left non-dominant side: Secondary | ICD-10-CM | POA: Diagnosis not present

## 2019-03-18 DIAGNOSIS — I6990 Unspecified sequelae of unspecified cerebrovascular disease: Secondary | ICD-10-CM | POA: Diagnosis not present

## 2019-03-18 DIAGNOSIS — R262 Difficulty in walking, not elsewhere classified: Secondary | ICD-10-CM

## 2019-03-18 DIAGNOSIS — M25572 Pain in left ankle and joints of left foot: Secondary | ICD-10-CM

## 2019-03-18 NOTE — Therapy (Signed)
[place], [place], [address] Phone: [phone]   Fax:  [phone]  Physical Therapy Treatment  Patient Details  Name: Karen Jackson MRN: [id number] Date of Birth: Apr 10, 1941 Referring Provider (PT): Lucey   Encounter Date: 03/18/2019  PT End of Session - 03/18/19 1150    Visit Number  6    Date for PT Re-Evaluation  05/04/19    PT Start Time  1055    PT Stop Time  1140    PT Time Calculation (min)  45 min    Activity Tolerance  Patient limited by fatigue    Behavior During Therapy  [facility] for tasks assessed/performed       Past Medical History:  Diagnosis Date  . Acute cystitis without hematuria   . Acute diastolic CHF (congestive heart failure) ([place])   . Arthritis   . Dyspnea   . Dysrhythmia   . Fever of unknown origin 03/19/2017  . Hyperlipidemia   . Hypertension    denies at preop  . Multifocal pneumonia   . Neuromuscular disorder ([place])    neuropathy left arm and foot  . Osteopenia   . Paralysis ([place])    partial left side from CVA   . Persistent atrial fibrillation   . PONV (postoperative nausea and vomiting)   . Pre-diabetes   . Stroke [facility]) 2013   hemmorahgic    Past Surgical History:  Procedure Laterality Date  . ANKLE SURGERY    . APPENDECTOMY    . CHOLECYSTECTOMY    . HERNIA REPAIR     Esophagus  . JOINT REPLACEMENT     total- right partial- left  . MASTECTOMY PARTIAL / LUMPECTOMY  2012   left  . ORIF ANKLE FRACTURE Left 07/20/2018   Procedure: OPEN REDUCTION INTERNAL FIXATION (ORIF) ANKLE FRACTURE;  Surgeon: Wylene Simmer, MD;  Location: [place];  Service: Orthopedics;  Laterality: Left;  . TOTAL KNEE ARTHROPLASTY Left 01/27/2019   Procedure: TOTAL KNEE ARTHROPLASTY;  Surgeon: Vickey Huger, MD;  [facility];  Service: Orthopedics;  Laterality: Left;    There were no vitals filed for this visit.  Subjective Assessment - 03/18/19 1118    Subjective  I did not  have a good weekend, did not feel well and had trouble trying to transfer    Currently in Pain?  Yes    Pain Score  2     Pain Location  Knee    Pain Orientation  Left    Pain Descriptors / Indicators  Sore    Aggravating Factors   standing                       [facility] Adult PT Treatment/Exercise - 03/18/19 0001      Transfers   Comments  sit to stand with mod A, really had a hard time today the left leg would buck;le      Ambulation/Gait   Gait Comments  2 people assist, working on gait, she really had difficulty with her left knee buckling, could not bear weight, she tended to bend at the waist and was not  able to take steps today      Knee/Hip Exercises: Aerobic   Nustep  level 4 x 7 minutes      Knee/Hip Exercises: Seated   Long Arc Quad  Strengthening;Left;3 sets;10 reps    Long Arc Quad Weight  5 lbs.    [place]  Quad Limitations  a lot of cues to stay on task and get TKE, had her do this again at the end with 2.5#    Other Seated Knee/Hip Exercises  left arm red tband exercises    Marching  Strengthening;Left;2 sets;10 reps    Hamstring Curl  Left;3 sets;10 reps               PT Short Term Goals - 03/14/19 1154      PT SHORT TERM GOAL #1   Title  independent with initial HEP    Status  Partially Met        PT Long Term Goals - 03/03/19 1238      PT LONG TERM GOAL #1   Title  walk with hand hold assist x 100 feet    Time  12    Period  Weeks    Status  New      PT LONG TERM GOAL #2   Title  increased right LE strength to 4-/5    Time  12    Period  Weeks    Status  New      PT LONG TERM GOAL #3   Title  100    Time  12    Period  Weeks    Status  New      PT LONG TERM GOAL #4   Title  transfer independently with set up    Time  12    Period  Weeks    Status  New      PT LONG TERM GOAL #5   Title  report no difficulty pulling her pants up when going to the bathroom    Time  12    Period  Weeks    Status  New             Plan - 03/18/19 1150    Clinical Impression Statement  When trying to do exercises in gym if there are others around she is very distracted and has a hard time doing the exercises, needing a lot of cues to perform.  Today her knees would not straighten and she had flexion at the hips when trying to stand, transfer and walk.  Much worse than last week when she walked, she reports that she did not have help over the weekend and was in bed all weekend.    PT Next Visit Plan  really try to get her to stand and walk    Consulted and Agree with Plan of Care  Patient       Patient will benefit from skilled therapeutic intervention in order to improve the following deficits and impairments:  Abnormal gait, Decreased coordination, Decreased range of motion, Difficulty walking, Cardiopulmonary status limiting activity, Decreased endurance, Increased muscle spasms, Impaired UE functional use, Decreased activity tolerance, Pain, Decreased balance, Postural dysfunction, Decreased strength, Decreased mobility  Visit Diagnosis: 1. Muscle weakness (generalized)   2. Difficulty in walking, not elsewhere classified   3. Stiffness of left knee, not elsewhere classified   4. Pain in left ankle and joints of left foot   5. Late effects of CVA (cerebrovascular accident)   6. Localized swelling, mass and lump, left lower limb   7. Hemiplegia and hemiparesis following cerebral infarction affecting left non-dominant side ([place])   8. Acute pain of left knee        Problem List Patient Active Problem List   Diagnosis Date Noted  . S/P total knee replacement 01/27/2019  .  Closed left ankle fracture 07/20/2018  . Acute ankle pain 05/23/2018  . Respiratory failure with hypoxia ([place]) 08/30/2017  . Hypoxemia   . Heart failure with preserved ejection fraction ([place]), Grade 3 diastolic dysfunction 17/92/1783  . Atrial fibrillation with RVR ([place])   . PAF (paroxysmal atrial fibrillation) ([place])   .  Metabolic syndrome 75/42/3702  . Encounter for preventive health examination 02/17/2016  . Morbid obesity ([place]) 06/17/2015  . Hypomagnesemia 04/24/2014  . Hemiparesis affecting left side as late effect of cerebrovascular accident ([place]) 04/24/2014  . Nontraumatic cerebral hemorrhage ([place]) 04/30/2012  . DM (diabetes mellitus) with complications ([place]) 30/17/2091  . OSTEOPENIA 01/21/2009  . UNSPECIFIED VITAMIN D DEFICIENCY 11/19/2007  . ESSENTIAL HYPERTENSION, BENIGN 11/19/2007  . HYPERCHOLESTEROLEMIA 10/25/2006  . GASTROESOPHAGEAL REFLUX, NO ESOPHAGITIS 10/25/2006  . DIVERTICULOSIS OF COLON 10/25/2006  . Osteoarthritis 10/25/2006  . CERVICAL SPINE DISORDER, NOS 10/25/2006    Sumner Boast., PT 03/18/2019, 11:53 AM  [place] [address] [place] [address] [place], [place], [address] Phone: [phone]   Fax:  [phone]  Name: Karen Jackson MRN: [id number] Date of Birth: 1941/08/14

## 2019-03-19 ENCOUNTER — Encounter: Payer: Medicare Other | Admitting: Physical Therapy

## 2019-03-20 ENCOUNTER — Ambulatory Visit: Payer: Medicare Other | Admitting: Physical Therapy

## 2019-03-20 ENCOUNTER — Encounter: Payer: Self-pay | Admitting: Physical Therapy

## 2019-03-20 ENCOUNTER — Other Ambulatory Visit: Payer: Self-pay

## 2019-03-20 DIAGNOSIS — R2242 Localized swelling, mass and lump, left lower limb: Secondary | ICD-10-CM

## 2019-03-20 DIAGNOSIS — R262 Difficulty in walking, not elsewhere classified: Secondary | ICD-10-CM | POA: Diagnosis not present

## 2019-03-20 DIAGNOSIS — I6990 Unspecified sequelae of unspecified cerebrovascular disease: Secondary | ICD-10-CM

## 2019-03-20 DIAGNOSIS — M6281 Muscle weakness (generalized): Secondary | ICD-10-CM

## 2019-03-20 DIAGNOSIS — M25562 Pain in left knee: Secondary | ICD-10-CM | POA: Diagnosis not present

## 2019-03-20 DIAGNOSIS — M25572 Pain in left ankle and joints of left foot: Secondary | ICD-10-CM

## 2019-03-20 DIAGNOSIS — M25662 Stiffness of left knee, not elsewhere classified: Secondary | ICD-10-CM | POA: Diagnosis not present

## 2019-03-20 DIAGNOSIS — I69354 Hemiplegia and hemiparesis following cerebral infarction affecting left non-dominant side: Secondary | ICD-10-CM | POA: Diagnosis not present

## 2019-03-20 NOTE — Therapy (Signed)
[place], [place], [address] Phone: [phone]   Fax:  [phone]  Physical Therapy Treatment  Patient Details  Name: Karen Jackson MRN: [id number] Date of Birth: March 20, 1941 Referring Provider (PT): Lucey   Encounter Date: 03/20/2019  PT End of Session - 03/20/19 1137    Visit Number  7    Date for PT Re-Evaluation  05/04/19    PT Start Time  1055    PT Stop Time  1145    PT Time Calculation (min)  50 min    Activity Tolerance  Patient limited by fatigue    Behavior During Therapy  [facility] for tasks assessed/performed       Past Medical History:  Diagnosis Date  . Acute cystitis without hematuria   . Acute diastolic CHF (congestive heart failure) ([place])   . Arthritis   . Dyspnea   . Dysrhythmia   . Fever of unknown origin 03/19/2017  . Hyperlipidemia   . Hypertension    denies at preop  . Multifocal pneumonia   . Neuromuscular disorder ([place])    neuropathy left arm and foot  . Osteopenia   . Paralysis ([place])    partial left side from CVA   . Persistent atrial fibrillation   . PONV (postoperative nausea and vomiting)   . Pre-diabetes   . Stroke [facility]) 2013   hemmorahgic    Past Surgical History:  Procedure Laterality Date  . ANKLE SURGERY    . APPENDECTOMY    . CHOLECYSTECTOMY    . HERNIA REPAIR     Esophagus  . JOINT REPLACEMENT     total- right partial- left  . MASTECTOMY PARTIAL / LUMPECTOMY  2012   left  . ORIF ANKLE FRACTURE Left 07/20/2018   Procedure: OPEN REDUCTION INTERNAL FIXATION (ORIF) ANKLE FRACTURE;  Surgeon: Wylene Simmer, MD;  Location: [facility];  Service: Orthopedics;  Laterality: Left;  . TOTAL KNEE ARTHROPLASTY Left 01/27/2019   Procedure: TOTAL KNEE ARTHROPLASTY;  Surgeon: Vickey Huger, MD;  [facility];  Service: Orthopedics;  Laterality: Left;    There were no vitals filed for this visit.  Subjective Assessment - 03/20/19 1135    Subjective  I am doing  better with transfers at home    Currently in Pain?  No/denies         [facility] PT Assessment - 03/20/19 0001      AROM   Left Knee Extension  0    Left Knee Flexion  97                   [facility] Adult PT Treatment/Exercise - 03/20/19 0001      Transfers   Comments  sit to stand working on posture, and weight bearing on the left with hips forward      Ambulation/Gait   Gait Comments  gait with HHA someone following with the walker, 15' x 2, needs cues for posture, steps and as she fatigues to use left quad as she will let the knee give on her      Knee/Hip Exercises: Aerobic   Nustep  level 5 x 7 minutes      Knee/Hip Exercises: Seated   Other Seated Knee/Hip Exercises  worked a lot today on the left arm motions, coordination and some strength as this is an issue when she tries to use the walker  PT Short Term Goals - 03/20/19 1140      PT SHORT TERM GOAL #1   Title  independent with initial HEP    Status  Achieved        PT Long Term Goals - 03/20/19 1140      PT LONG TERM GOAL #1   Title  walk with hand hold assist x 100 feet    Status  On-going      PT LONG TERM GOAL #2   Title  increased right LE strength to 4-/5    Status  On-going      PT LONG TERM GOAL #3   Title  walk 100 feet with SBA    Status  On-going      PT LONG TERM GOAL #4   Title  transfer independently with set up    Status  On-going      PT LONG TERM GOAL #5   Title  report no difficulty pulling her pants up when going to the bathroom    Status  On-going            Plan - 03/20/19 1138    Clinical Impression Statement  WE have been very limited in starting PT due to the left ankle was rolling very bad with any weight bearing , getting to the point wehre she would not put weight on the left, we got the new brace and this has helped dramatically, other issues that hinder progress is the stroke and especially the left hand coordination and function, when  trying to use the walker for gait the left hand falls off.  HHA at this time works the best    PT Next Visit Plan  will work to advance her as we can, will add left arm exercises to help with her gait and use of the hand on the walker    Consulted and Agree with Plan of Care  Patient       Patient will benefit from skilled therapeutic intervention in order to improve the following deficits and impairments:  Abnormal gait, Decreased coordination, Decreased range of motion, Difficulty walking, Cardiopulmonary status limiting activity, Decreased endurance, Increased muscle spasms, Impaired UE functional use, Decreased activity tolerance, Pain, Decreased balance, Postural dysfunction, Decreased strength, Decreased mobility  Visit Diagnosis: 1. Muscle weakness (generalized)   2. Difficulty in walking, not elsewhere classified   3. Stiffness of left knee, not elsewhere classified   4. Pain in left ankle and joints of left foot   5. Late effects of CVA (cerebrovascular accident)   6. Localized swelling, mass and lump, left lower limb        Problem List Patient Active Problem List   Diagnosis Date Noted  . S/P total knee replacement 01/27/2019  . Closed left ankle fracture 07/20/2018  . Acute ankle pain 05/23/2018  . Respiratory failure with hypoxia ([place]) 08/30/2017  . Hypoxemia   . Heart failure with preserved ejection fraction ([place]), Grade 3 diastolic dysfunction 93/71/6967  . Atrial fibrillation with RVR ([place])   . PAF (paroxysmal atrial fibrillation) ([place])   . Metabolic syndrome 89/38/1017  . Encounter for preventive health examination 02/17/2016  . Morbid obesity ([place]) 06/17/2015  . Hypomagnesemia 04/24/2014  . Hemiparesis affecting left side as late effect of cerebrovascular accident ([place]) 04/24/2014  . Nontraumatic cerebral hemorrhage ([place]) 04/30/2012  . DM (diabetes mellitus) with complications ([place]) 51/09/5850  . OSTEOPENIA 01/21/2009  . UNSPECIFIED VITAMIN D DEFICIENCY  11/19/2007  . ESSENTIAL HYPERTENSION, BENIGN 11/19/2007  .  HYPERCHOLESTEROLEMIA 10/25/2006  . GASTROESOPHAGEAL REFLUX, NO ESOPHAGITIS 10/25/2006  . DIVERTICULOSIS OF COLON 10/25/2006  . Osteoarthritis 10/25/2006  . CERVICAL SPINE DISORDER, NOS 10/25/2006    Sumner Boast., PT 03/20/2019, 11:41 AM  [place] [address] [place], [place], [address] Phone: [phone]   Fax:  [phone]  Name: Karen Jackson MRN: [id number] Date of Birth: Apr 04, 1941

## 2019-03-21 ENCOUNTER — Encounter: Payer: Medicare Other | Admitting: Physical Therapy

## 2019-03-24 ENCOUNTER — Ambulatory Visit: Payer: Medicare Other | Admitting: Physical Therapy

## 2019-03-24 ENCOUNTER — Telehealth (INDEPENDENT_AMBULATORY_CARE_PROVIDER_SITE_OTHER): Payer: Medicare Other | Admitting: Family Medicine

## 2019-03-24 ENCOUNTER — Encounter: Payer: Self-pay | Admitting: Family Medicine

## 2019-03-24 ENCOUNTER — Encounter: Payer: Self-pay | Admitting: Physical Therapy

## 2019-03-24 ENCOUNTER — Other Ambulatory Visit: Payer: Self-pay

## 2019-03-24 DIAGNOSIS — R262 Difficulty in walking, not elsewhere classified: Secondary | ICD-10-CM | POA: Diagnosis not present

## 2019-03-24 DIAGNOSIS — I69354 Hemiplegia and hemiparesis following cerebral infarction affecting left non-dominant side: Secondary | ICD-10-CM | POA: Diagnosis not present

## 2019-03-24 DIAGNOSIS — M25662 Stiffness of left knee, not elsewhere classified: Secondary | ICD-10-CM

## 2019-03-24 DIAGNOSIS — M6281 Muscle weakness (generalized): Secondary | ICD-10-CM

## 2019-03-24 DIAGNOSIS — I6990 Unspecified sequelae of unspecified cerebrovascular disease: Secondary | ICD-10-CM

## 2019-03-24 DIAGNOSIS — M25562 Pain in left knee: Secondary | ICD-10-CM | POA: Diagnosis not present

## 2019-03-24 DIAGNOSIS — M25572 Pain in left ankle and joints of left foot: Secondary | ICD-10-CM

## 2019-03-24 DIAGNOSIS — N390 Urinary tract infection, site not specified: Secondary | ICD-10-CM

## 2019-03-24 MED ORDER — CEPHALEXIN 250 MG PO CAPS: 250.0000 mg | ORAL_CAPSULE | Freq: Four times a day (QID) | ORAL | 0 refills | Status: DC

## 2019-03-24 NOTE — Progress Notes (Signed)
Agrees to phone.  Burning with urination.  Now painful on urination.  Denies fever, nausea, vomiting, flank pain or systemic symptoms. Has had frequent UTIs since her CVA.  Does usually void without difficulty and feels that her bladder empties completely.  Staying isolated.  Not at risk for COVID   No antibiotic allergies.  RX sent  Duration of phone call as 15 minutes.

## 2019-03-24 NOTE — Therapy (Signed)
[place], [place], [address] Phone: [phone]   Fax:  [phone]  Physical Therapy Treatment  Patient Details  Name: Karen Jackson MRN: [id number] Date of Birth: Jul 05, 1941 Referring Provider (PT): Lucey   Encounter Date: 03/24/2019  PT End of Session - 03/24/19 1147    Visit Number  8    Date for PT Re-Evaluation  05/04/19    PT Start Time  1055    PT Stop Time  1140    PT Time Calculation (min)  45 min    Activity Tolerance  Patient limited by fatigue    Behavior During Therapy  [facility] for tasks assessed/performed       Past Medical History:  Diagnosis Date  . Acute cystitis without hematuria   . Acute diastolic CHF (congestive heart failure) ([place])   . Arthritis   . Dyspnea   . Dysrhythmia   . Fever of unknown origin 03/19/2017  . Hyperlipidemia   . Hypertension    denies at preop  . Multifocal pneumonia   . Neuromuscular disorder ([place])    neuropathy left arm and foot  . Osteopenia   . Paralysis ([place])    partial left side from CVA   . Persistent atrial fibrillation   . PONV (postoperative nausea and vomiting)   . Pre-diabetes   . Stroke [facility]) 2013   hemmorahgic    Past Surgical History:  Procedure Laterality Date  . ANKLE SURGERY    . APPENDECTOMY    . CHOLECYSTECTOMY    . HERNIA REPAIR     Esophagus  . JOINT REPLACEMENT     total- right partial- left  . MASTECTOMY PARTIAL / LUMPECTOMY  2012   left  . ORIF ANKLE FRACTURE Left 07/20/2018   Procedure: OPEN REDUCTION INTERNAL FIXATION (ORIF) ANKLE FRACTURE;  Surgeon: Wylene Simmer, MD;  Location: [facility];  Service: Orthopedics;  Laterality: Left;  . TOTAL KNEE ARTHROPLASTY Left 01/27/2019   Procedure: TOTAL KNEE ARTHROPLASTY;  Surgeon: Vickey Huger, MD;  [facility];  Service: Orthopedics;  Laterality: Left;    There were no vitals filed for this visit.                    [facility] Adult PT  Treatment/Exercise - 03/24/19 0001      Ambulation/Gait   Gait Comments  gait with HHA someone following with the walker, 20' x 2, needs cues for posture, steps and as she fatigues to use left quad as she will let the knee give on her      Knee/Hip Exercises: Aerobic   Nustep  level 5 x 7 minutes      Knee/Hip Exercises: Seated   Long Arc Quad  Strengthening;Left;3 sets;10 reps    Long Arc Quad Weight  3 lbs.    Long [employer] Limitations  less cues to get the TKE, fatigues on the last few reps on each set    Other Seated Knee/Hip Exercises  worked a lot today on the left arm motions, coordination and some strength as this is an issue when she tries to use the walker    Marching  Strengthening;Left;2 sets;10 reps    Marching Weights  3 lbs.    Hamstring Curl  Left;3 sets;10 reps    Hamstring Limitations  red tband      Manual Therapy   Manual Therapy  Passive ROM    Passive ROM  motion of the  knee and then with pressure through the ankle for eversion to help with not rolling ankle               PT Short Term Goals - 03/20/19 1140      PT SHORT TERM GOAL #1   Title  independent with initial HEP    Status  Achieved        PT Long Term Goals - 03/20/19 1140      PT LONG TERM GOAL #1   Title  walk with hand hold assist x 100 feet    Status  On-going      PT LONG TERM GOAL #2   Title  increased right LE strength to 4-/5    Status  On-going      PT LONG TERM GOAL #3   Title  walk 100 feet with SBA    Status  On-going      PT LONG TERM GOAL #4   Title  transfer independently with set up    Status  On-going      PT LONG TERM GOAL #5   Title  report no difficulty pulling her pants up when going to the bathroom    Status  On-going            Plan - 03/24/19 1147    Clinical Impression Statement  Patient did very well today, the walking is getting better but the left leg fatigues after 15 feet and it starts to give.  She does get short of breath with walking     Examination-Activity Limitations  Bathing;Bed Mobility;Lift;Squat;Stairs;Locomotion Level;Stand;Toileting;Transfers;Dressing    PT Next Visit Plan  will work to advance her as we can, will add left arm exercises to help with her gait and use of the hand on the walker       Patient will benefit from skilled therapeutic intervention in order to improve the following deficits and impairments:  Abnormal gait, Decreased coordination, Decreased range of motion, Difficulty walking, Cardiopulmonary status limiting activity, Decreased endurance, Increased muscle spasms, Impaired UE functional use, Decreased activity tolerance, Pain, Decreased balance, Postural dysfunction, Decreased strength, Decreased mobility  Visit Diagnosis: 1. Muscle weakness (generalized)   2. Difficulty in walking, not elsewhere classified   3. Stiffness of left knee, not elsewhere classified   4. Pain in left ankle and joints of left foot   5. Late effects of CVA (cerebrovascular accident)        Problem List Patient Active Problem List   Diagnosis Date Noted  . S/P total knee replacement 01/27/2019  . Closed left ankle fracture 07/20/2018  . Acute ankle pain 05/23/2018  . Respiratory failure with hypoxia ([place]) 08/30/2017  . UTI (urinary tract infection) 07/05/2017  . Hypoxemia   . Heart failure with preserved ejection fraction ([place]), Grade 3 diastolic dysfunction 08/30/7251  . Atrial fibrillation with RVR ([place])   . PAF (paroxysmal atrial fibrillation) ([place])   . Metabolic syndrome 66/44/0347  . Encounter for preventive health examination 02/17/2016  . Morbid obesity ([place]) 06/17/2015  . Hypomagnesemia 04/24/2014  . Hemiparesis affecting left side as late effect of cerebrovascular accident ([place]) 04/24/2014  . Nontraumatic cerebral hemorrhage ([place]) 04/30/2012  . DM (diabetes mellitus) with complications ([place]) 42/59/5638  . OSTEOPENIA 01/21/2009  . UNSPECIFIED VITAMIN D DEFICIENCY 11/19/2007  . ESSENTIAL  HYPERTENSION, BENIGN 11/19/2007  . HYPERCHOLESTEROLEMIA 10/25/2006  . GASTROESOPHAGEAL REFLUX, NO ESOPHAGITIS 10/25/2006  . DIVERTICULOSIS OF COLON 10/25/2006  . Osteoarthritis 10/25/2006  . CERVICAL SPINE DISORDER, NOS 10/25/2006  Sumner Boast., PT 03/24/2019, 11:50 AM  [place] [address] [place], [place], [address] Phone: [phone]   Fax:  [phone]  Name: Karen Jackson MRN: [id number] Date of Birth: 1941-05-14

## 2019-03-24 NOTE — Assessment & Plan Note (Signed)
Sounds like uncomplicated UTI.

## 2019-03-26 ENCOUNTER — Ambulatory Visit: Payer: Medicare Other | Admitting: Physical Therapy

## 2019-03-26 ENCOUNTER — Other Ambulatory Visit: Payer: Self-pay

## 2019-03-26 ENCOUNTER — Encounter: Payer: Self-pay | Admitting: Physical Therapy

## 2019-03-26 DIAGNOSIS — R262 Difficulty in walking, not elsewhere classified: Secondary | ICD-10-CM | POA: Diagnosis not present

## 2019-03-26 DIAGNOSIS — M25572 Pain in left ankle and joints of left foot: Secondary | ICD-10-CM

## 2019-03-26 DIAGNOSIS — M25662 Stiffness of left knee, not elsewhere classified: Secondary | ICD-10-CM | POA: Diagnosis not present

## 2019-03-26 DIAGNOSIS — M6281 Muscle weakness (generalized): Secondary | ICD-10-CM

## 2019-03-26 DIAGNOSIS — M25562 Pain in left knee: Secondary | ICD-10-CM | POA: Diagnosis not present

## 2019-03-26 DIAGNOSIS — I6990 Unspecified sequelae of unspecified cerebrovascular disease: Secondary | ICD-10-CM | POA: Diagnosis not present

## 2019-03-26 DIAGNOSIS — I69354 Hemiplegia and hemiparesis following cerebral infarction affecting left non-dominant side: Secondary | ICD-10-CM | POA: Diagnosis not present

## 2019-03-26 NOTE — Therapy (Signed)
[place] [address] [place], [place], [address] Phone: [phone]   Fax:  [phone]  Physical Therapy Treatment  Patient Details  Name: Karen Jackson MRN: [id number] Date of Birth: 01/11/1941 Referring Provider (PT): Lucey   Encounter Date: 03/26/2019  PT End of Session - 03/26/19 1145    Visit Number  9    Date for PT Re-Evaluation  05/04/19    PT Start Time  1055    PT Stop Time  1140    PT Time Calculation (min)  45 min    Activity Tolerance  Patient limited by fatigue    Behavior During Therapy  [facility] for tasks assessed/performed       Past Medical History:  Diagnosis Date  . Acute cystitis without hematuria   . Acute diastolic CHF (congestive heart failure) ([place])   . Arthritis   . Dyspnea   . Dysrhythmia   . Fever of unknown origin 03/19/2017  . Hyperlipidemia   . Hypertension    denies at preop  . Multifocal pneumonia   . Neuromuscular disorder ([place])    neuropathy left arm and foot  . Osteopenia   . Paralysis ([place])    partial left side from CVA   . Persistent atrial fibrillation   . PONV (postoperative nausea and vomiting)   . Pre-diabetes   . Stroke [facility]) 2013   hemmorahgic    Past Surgical History:  Procedure Laterality Date  . ANKLE SURGERY    . APPENDECTOMY    . CHOLECYSTECTOMY    . HERNIA REPAIR     Esophagus  . JOINT REPLACEMENT     total- right partial- left  . MASTECTOMY PARTIAL / LUMPECTOMY  2012   left  . ORIF ANKLE FRACTURE Left 07/20/2018   Procedure: OPEN REDUCTION INTERNAL FIXATION (ORIF) ANKLE FRACTURE;  Surgeon: Wylene Simmer, MD;  Location: [place];  Service: Orthopedics;  Laterality: Left;  . TOTAL KNEE ARTHROPLASTY Left 01/27/2019   Procedure: TOTAL KNEE ARTHROPLASTY;  Surgeon: Vickey Huger, MD;  [facility];  Service: Orthopedics;  Laterality: Left;    There were no vitals filed for this visit.  Subjective Assessment - 03/26/19 1100    Subjective  Reports  that she is tired after the walking    Currently in Pain?  No/denies                       [facility] Adult PT Treatment/Exercise - 03/26/19 0001      Ambulation/Gait   Gait Comments  Gait with HHA and someone behind with W/C 18', 25' and 20', a lot of cues for step length, postue and not get the left knee straight as it tends to buckle and give especially when she is tired      Knee/Hip Exercises: Aerobic   Nustep  level 5 x 7 minutes      Knee/Hip Exercises: Seated   Ball Squeeze  25    Clamshell with TheraBand  Red    Other Seated Knee/Hip Exercises  sitting balance with ball toss and her hitting it back to me, red tband ankle DF 3 x10    Other Seated Knee/Hip Exercises  bosu behind her working on core, 2# on stick with both arms reaching    Hamstring Curl  Left;3 sets;10 reps    Hamstring Limitations  red tband               PT Short Term  Goals - 03/20/19 1140      PT SHORT TERM GOAL #1   Title  independent with initial HEP    Status  Achieved        PT Long Term Goals - 03/26/19 1148      PT LONG TERM GOAL #1   Title  walk with hand hold assist x 100 feet    Status  On-going      PT LONG TERM GOAL #5   Title  report no difficulty pulling her pants up when going to the bathroom    Status  On-going            Plan - 03/26/19 1146    Clinical Impression Statement  Worked some more on core today with her sitting unsupported.  She remains fearful of walking, needs someone close by with w/c.  The left knee starts to give after about 15' and requires cues to maintain the knee and continue to walk, her normal is to just sit in an unsafe manner.  She has many comorbitiies that really add up and work against her recovery    PT Next Visit Plan  will work to advance her as we can, will add left arm exercises to help with her gait and use of the hand on the walker    Consulted and Agree with Plan of Care  Patient       Patient will benefit from  skilled therapeutic intervention in order to improve the following deficits and impairments:  Abnormal gait, Decreased coordination, Decreased range of motion, Difficulty walking, Cardiopulmonary status limiting activity, Decreased endurance, Increased muscle spasms, Impaired UE functional use, Decreased activity tolerance, Pain, Decreased balance, Postural dysfunction, Decreased strength, Decreased mobility  Visit Diagnosis: 1. Muscle weakness (generalized)   2. Difficulty in walking, not elsewhere classified   3. Stiffness of left knee, not elsewhere classified   4. Pain in left ankle and joints of left foot   5. Late effects of CVA (cerebrovascular accident)        Problem List Patient Active Problem List   Diagnosis Date Noted  . S/P total knee replacement 01/27/2019  . Closed left ankle fracture 07/20/2018  . Acute ankle pain 05/23/2018  . Respiratory failure with hypoxia ([place]) 08/30/2017  . UTI (urinary tract infection) 07/05/2017  . Hypoxemia   . Heart failure with preserved ejection fraction ([place]), Grade 3 diastolic dysfunction 45/36/4680  . Atrial fibrillation with RVR ([place])   . PAF (paroxysmal atrial fibrillation) ([place])   . Metabolic syndrome 32/07/2481  . Encounter for preventive health examination 02/17/2016  . Morbid obesity ([place]) 06/17/2015  . Hypomagnesemia 04/24/2014  . Hemiparesis affecting left side as late effect of cerebrovascular accident ([place]) 04/24/2014  . Nontraumatic cerebral hemorrhage ([place]) 04/30/2012  . DM (diabetes mellitus) with complications ([place]) 50/10/7046  . OSTEOPENIA 01/21/2009  . UNSPECIFIED VITAMIN D DEFICIENCY 11/19/2007  . ESSENTIAL HYPERTENSION, BENIGN 11/19/2007  . HYPERCHOLESTEROLEMIA 10/25/2006  . GASTROESOPHAGEAL REFLUX, NO ESOPHAGITIS 10/25/2006  . DIVERTICULOSIS OF COLON 10/25/2006  . Osteoarthritis 10/25/2006  . CERVICAL SPINE DISORDER, NOS 10/25/2006    Sumner Boast., PT 03/26/2019, 11:49 AM  [address] [place], [place], [address] Phone: [phone]   Fax:  [phone]  Name: ARACELLI WOLOSZYN MRN: [id number] Date of Birth: 04-02-1941

## 2019-03-27 ENCOUNTER — Ambulatory Visit: Payer: Medicare Other | Admitting: Physical Therapy

## 2019-03-28 ENCOUNTER — Other Ambulatory Visit: Payer: Self-pay

## 2019-03-28 ENCOUNTER — Ambulatory Visit: Payer: Medicare Other | Admitting: Physical Therapy

## 2019-03-28 DIAGNOSIS — M25662 Stiffness of left knee, not elsewhere classified: Secondary | ICD-10-CM

## 2019-03-28 DIAGNOSIS — M25572 Pain in left ankle and joints of left foot: Secondary | ICD-10-CM

## 2019-03-28 DIAGNOSIS — I69354 Hemiplegia and hemiparesis following cerebral infarction affecting left non-dominant side: Secondary | ICD-10-CM | POA: Diagnosis not present

## 2019-03-28 DIAGNOSIS — R262 Difficulty in walking, not elsewhere classified: Secondary | ICD-10-CM

## 2019-03-28 DIAGNOSIS — M6281 Muscle weakness (generalized): Secondary | ICD-10-CM

## 2019-03-28 DIAGNOSIS — M25562 Pain in left knee: Secondary | ICD-10-CM | POA: Diagnosis not present

## 2019-03-28 DIAGNOSIS — I6990 Unspecified sequelae of unspecified cerebrovascular disease: Secondary | ICD-10-CM | POA: Diagnosis not present

## 2019-03-28 NOTE — Therapy (Signed)
[address] [place], [place], [address] Phone: [phone]   Fax:  [phone]  Physical Therapy Treatment  Patient Details  Name: Karen Jackson MRN: [id number] Date of Birth: [birth year] Referring Provider (PT): Lucey   Encounter Date: 03/28/2019  PT End of Session - 03/28/19 1135    Visit Number  10    Date for PT Re-Evaluation  05/04/19    PT Start Time  1050    PT Stop Time  1142    PT Time Calculation (min)  52 min       Past Medical History:  Diagnosis Date  . Acute cystitis without hematuria   . Acute diastolic CHF (congestive heart failure) ([place])   . Arthritis   . Dyspnea   . Dysrhythmia   . Fever of unknown origin 03/19/2017  . Hyperlipidemia   . Hypertension    denies at preop  . Multifocal pneumonia   . Neuromuscular disorder ([place])    neuropathy left arm and foot  . Osteopenia   . Paralysis ([place])    partial left side from CVA   . Persistent atrial fibrillation   . PONV (postoperative nausea and vomiting)   . Pre-diabetes   . Stroke [facility]) 2013   hemmorahgic    Past Surgical History:  Procedure Laterality Date  . ANKLE SURGERY    . APPENDECTOMY    . CHOLECYSTECTOMY    . HERNIA REPAIR     Esophagus  . JOINT REPLACEMENT     total- right partial- left  . MASTECTOMY PARTIAL / LUMPECTOMY  2012   left  . ORIF ANKLE FRACTURE Left 07/20/2018   Procedure: OPEN REDUCTION INTERNAL FIXATION (ORIF) ANKLE FRACTURE;  Surgeon: Wylene Simmer, MD;  Location: [facility];  Service: Orthopedics;  Laterality: Left;  . TOTAL KNEE ARTHROPLASTY Left 01/27/2019   Procedure: TOTAL KNEE ARTHROPLASTY;  Surgeon: Vickey Huger, MD;  [facility];  Service: Orthopedics;  Laterality: Left;    There were no vitals filed for this visit.  Subjective Assessment - 03/28/19 1056    Subjective  busy morning. hair cut and when to big girl potty    Currently in Pain?  Yes    Pain Score  5     Pain Location  Knee     Pain Orientation  Left                       [facility] Adult PT Treatment/Exercise - 03/28/19 0001      Ambulation/Gait   Gait Comments  Gait with HHA and someone behind with W/C 20', 30' and 18', knee only buckled 1 time with fatigue in last walk. afte ex walked 1 more times 15 feet   mod A , able to self cue for step length      Knee/Hip Exercises: Aerobic   Nustep  level 5 x 7 minutes      Knee/Hip Exercises: Seated   Long Arc Quad  Strengthening;Left;10 reps;2 sets    [employer] Weight  3 lbs.    Other Seated Knee/Hip Exercises  hip abd 2 sets 10 BIL 3#    Other Seated Knee/Hip Exercises  ball tap sitting on edge of seat tapping for core strength and UE reaching   wt ball ex for core on EOM   Marching  Strengthening;Left;2 sets;10 reps    Marching Weights  3 lbs.    Hamstring Curl  Strengthening;Both;2 sets;10 reps  red tband              PT Short Term Goals - 03/20/19 1140      PT SHORT TERM GOAL #1   Title  independent with initial HEP    Status  Achieved        PT Long Term Goals - 03/26/19 1148      PT LONG TERM GOAL #1   Title  walk with hand hold assist x 100 feet    Status  On-going      PT LONG TERM GOAL #5   Title  report no difficulty pulling her pants up when going to the bathroom    Status  On-going            Plan - 03/28/19 1135    Clinical Impression Statement  pt amb 4 times with HHA, pt able ot self cue for step length and knee only buckled 1 times in 3rd walk and 4th walk was after ther ex. ex to UE and LE seated EOB to engage core    PT Treatment/Interventions  ADLs/Self Care Home Management;Cryotherapy;Electrical Stimulation;Iontophoresis 4mg /ml Dexamethasone;Moist Heat;Ultrasound;Gait training;Balance training;Therapeutic exercise;Therapeutic activities;Functional mobility training;Patient/family education;Manual techniques;Dry needling    PT Next Visit Plan  will work to advance her as we can, will add left arm  exercises to help with her gait and use of the hand on the walker       Patient will benefit from skilled therapeutic intervention in order to improve the following deficits and impairments:  Abnormal gait, Decreased coordination, Decreased range of motion, Difficulty walking, Cardiopulmonary status limiting activity, Decreased endurance, Increased muscle spasms, Impaired UE functional use, Decreased activity tolerance, Pain, Decreased balance, Postural dysfunction, Decreased strength, Decreased mobility  Visit Diagnosis: 1. Difficulty in walking, not elsewhere classified   2. Stiffness of left knee, not elsewhere classified   3. Muscle weakness (generalized)   4. Pain in left ankle and joints of left foot        Problem List Patient Active Problem List   Diagnosis Date Noted  . S/P total knee replacement 01/27/2019  . Closed left ankle fracture 07/20/2018  . Acute ankle pain 05/23/2018  . Respiratory failure with hypoxia ([place]) 08/30/2017  . UTI (urinary tract infection) 07/05/2017  . Hypoxemia   . Heart failure with preserved ejection fraction ([place]), Grade 3 diastolic dysfunction 33/00/7622  . Atrial fibrillation with RVR ([place])   . PAF (paroxysmal atrial fibrillation) ([place])   . Metabolic syndrome 63/33/5456  . Encounter for preventive health examination 02/17/2016  . Morbid obesity ([place]) 06/17/2015  . Hypomagnesemia 04/24/2014  . Hemiparesis affecting left side as late effect of cerebrovascular accident ([place]) 04/24/2014  . Nontraumatic cerebral hemorrhage ([place]) 04/30/2012  . DM (diabetes mellitus) with complications ([place]) 25/63/8937  . OSTEOPENIA 01/21/2009  . UNSPECIFIED VITAMIN D DEFICIENCY 11/19/2007  . ESSENTIAL HYPERTENSION, BENIGN 11/19/2007  . HYPERCHOLESTEROLEMIA 10/25/2006  . GASTROESOPHAGEAL REFLUX, NO ESOPHAGITIS 10/25/2006  . DIVERTICULOSIS OF COLON 10/25/2006  . Osteoarthritis 10/25/2006  . CERVICAL SPINE DISORDER, NOS 10/25/2006    ,ANGIE  PTA 03/28/2019, 11:43 AM  [place] [address] [place], [place], [address] Phone: [phone]   Fax:  [phone]  Name: Karen Jackson MRN: [id number] Date of Birth: November 02, 1940

## 2019-03-31 ENCOUNTER — Encounter: Payer: Self-pay | Admitting: Physical Therapy

## 2019-03-31 ENCOUNTER — Other Ambulatory Visit: Payer: Self-pay

## 2019-03-31 ENCOUNTER — Ambulatory Visit: Payer: Medicare Other | Attending: Orthopedic Surgery | Admitting: Physical Therapy

## 2019-03-31 DIAGNOSIS — I69354 Hemiplegia and hemiparesis following cerebral infarction affecting left non-dominant side: Secondary | ICD-10-CM | POA: Diagnosis not present

## 2019-03-31 DIAGNOSIS — I6990 Unspecified sequelae of unspecified cerebrovascular disease: Secondary | ICD-10-CM | POA: Diagnosis not present

## 2019-03-31 DIAGNOSIS — R2242 Localized swelling, mass and lump, left lower limb: Secondary | ICD-10-CM | POA: Insufficient documentation

## 2019-03-31 DIAGNOSIS — M6281 Muscle weakness (generalized): Secondary | ICD-10-CM

## 2019-03-31 DIAGNOSIS — M25562 Pain in left knee: Secondary | ICD-10-CM | POA: Diagnosis not present

## 2019-03-31 DIAGNOSIS — R262 Difficulty in walking, not elsewhere classified: Secondary | ICD-10-CM

## 2019-03-31 DIAGNOSIS — M25662 Stiffness of left knee, not elsewhere classified: Secondary | ICD-10-CM

## 2019-03-31 DIAGNOSIS — M25572 Pain in left ankle and joints of left foot: Secondary | ICD-10-CM | POA: Diagnosis not present

## 2019-03-31 DIAGNOSIS — I6930 Unspecified sequelae of cerebral infarction: Secondary | ICD-10-CM

## 2019-03-31 NOTE — Therapy (Signed)
[place] [address] [place], [place], [address] Phone: [phone]   Fax:  [phone]  Physical Therapy Treatment  Patient Details  Name: Karen Jackson MRN: [id number] Date of Birth: 02-22-1941 Referring Provider (PT): Lucey   Encounter Date: 03/31/2019  PT End of Session - 03/31/19 1146    Visit Number  11    Date for PT Re-Evaluation  05/04/19    PT Start Time  1056    PT Stop Time  1143    PT Time Calculation (min)  47 min    Activity Tolerance  Patient limited by fatigue    Behavior During Therapy  [facility] for tasks assessed/performed       Past Medical History:  Diagnosis Date  . Acute cystitis without hematuria   . Acute diastolic CHF (congestive heart failure) ([place])   . Arthritis   . Dyspnea   . Dysrhythmia   . Fever of unknown origin 03/19/2017  . Hyperlipidemia   . Hypertension    denies at preop  . Multifocal pneumonia   . Neuromuscular disorder ([place])    neuropathy left arm and foot  . Osteopenia   . Paralysis ([place])    partial left side from CVA   . Persistent atrial fibrillation   . PONV (postoperative nausea and vomiting)   . Pre-diabetes   . Stroke [facility]) 2013   hemmorahgic    Past Surgical History:  Procedure Laterality Date  . ANKLE SURGERY    . APPENDECTOMY    . CHOLECYSTECTOMY    . HERNIA REPAIR     Esophagus  . JOINT REPLACEMENT     total- right partial- left  . MASTECTOMY PARTIAL / LUMPECTOMY  2012   left  . ORIF ANKLE FRACTURE Left 07/20/2018   Procedure: OPEN REDUCTION INTERNAL FIXATION (ORIF) ANKLE FRACTURE;  Surgeon: Wylene Simmer, MD;  Location: [facility];  Service: Orthopedics;  Laterality: Left;  . TOTAL KNEE ARTHROPLASTY Left 01/27/2019   Procedure: TOTAL KNEE ARTHROPLASTY;  Surgeon: Vickey Huger, MD;  [facility];  Service: Orthopedics;  Laterality: Left;    There were no vitals filed for this visit.  Subjective Assessment - 03/31/19 1109    Subjective  Reports  that she is feeling better about transfewrs with different people and safer.                       [facility] Adult PT Treatment/Exercise - 03/31/19 0001      Ambulation/Gait   Gait Comments  gait with HHA 33', then rest and 30' cue for big steps and always cues at the end as she gets fatigued and the left leg wants to buckle, also needs cues to get fully turned before sitting      Knee/Hip Exercises: Aerobic   Nustep  level 5 x 7 minutes      Knee/Hip Exercises: Seated   Long Arc Quad  Strengthening;Left;10 reps;2 sets    [employer] Weight  5 lbs.    Long [employer] Limitations  cues for the TKE    Other Seated Knee/Hip Exercises  hip abd 2 sets 10 BIL 3#    Marching  Strengthening;Left;2 sets;10 reps    Hamstring Curl  Strengthening;Both;2 sets;10 reps    Hamstring Limitations  red tband               PT Short Term Goals - 03/20/19 1140      PT SHORT  TERM GOAL #1   Title  independent with initial HEP    Status  Achieved        PT Long Term Goals - 03/26/19 1148      PT LONG TERM GOAL #1   Title  walk with hand hold assist x 100 feet    Status  On-going      PT LONG TERM GOAL #5   Title  report no difficulty pulling her pants up when going to the bathroom    Status  On-going            Plan - 03/31/19 1146    Clinical Impression Statement  Did all exercises in unsupported sitting to work her core some.  She still requires cues for full turn before sitting as she tends to sit too early and then twist he left knee.  I have concerns about her left hand staying on the walker if she uses the walker, she brought in a piece that was made in the past but it does not have good velro and seems to be missing some pieces to help her hand stay on the walker. Asked her to llok at home and then we may call OT    PT Next Visit Plan  will work to advance her as we can, will add left arm exercises to help with her gait and use of the hand on the walker     Consulted and Agree with Plan of Care  Patient       Patient will benefit from skilled therapeutic intervention in order to improve the following deficits and impairments:  Abnormal gait, Decreased coordination, Decreased range of motion, Difficulty walking, Cardiopulmonary status limiting activity, Decreased endurance, Increased muscle spasms, Impaired UE functional use, Decreased activity tolerance, Pain, Decreased balance, Postural dysfunction, Decreased strength, Decreased mobility  Visit Diagnosis: 1. Difficulty in walking, not elsewhere classified   2. Stiffness of left knee, not elsewhere classified   3. Muscle weakness (generalized)   4. Pain in left ankle and joints of left foot   5. Late effects of CVA (cerebrovascular accident)        Problem List Patient Active Problem List   Diagnosis Date Noted  . S/P total knee replacement 01/27/2019  . Closed left ankle fracture 07/20/2018  . Acute ankle pain 05/23/2018  . Respiratory failure with hypoxia ([place]) 08/30/2017  . UTI (urinary tract infection) 07/05/2017  . Hypoxemia   . Heart failure with preserved ejection fraction ([place]), Grade 3 diastolic dysfunction 81/19/1478  . Atrial fibrillation with RVR ([place])   . PAF (paroxysmal atrial fibrillation) ([place])   . Metabolic syndrome 29/56/2130  . Encounter for preventive health examination 02/17/2016  . Morbid obesity ([place]) 06/17/2015  . Hypomagnesemia 04/24/2014  . Hemiparesis affecting left side as late effect of cerebrovascular accident ([place]) 04/24/2014  . Nontraumatic cerebral hemorrhage ([place]) 04/30/2012  . DM (diabetes mellitus) with complications ([place]) 86/57/8469  . OSTEOPENIA 01/21/2009  . UNSPECIFIED VITAMIN D DEFICIENCY 11/19/2007  . ESSENTIAL HYPERTENSION, BENIGN 11/19/2007  . HYPERCHOLESTEROLEMIA 10/25/2006  . GASTROESOPHAGEAL REFLUX, NO ESOPHAGITIS 10/25/2006  . DIVERTICULOSIS OF COLON 10/25/2006  . Osteoarthritis 10/25/2006  . CERVICAL SPINE DISORDER, NOS  10/25/2006    Sumner Boast., PT 03/31/2019, 11:50 AM  [address] [place], [place], [address] Phone: [phone]   Fax:  [phone]  Name: Karen Jackson MRN: [id number] Date of Birth: 12/18/1940

## 2019-04-02 ENCOUNTER — Ambulatory Visit: Payer: Medicare Other | Admitting: Physical Therapy

## 2019-04-02 ENCOUNTER — Other Ambulatory Visit: Payer: Self-pay

## 2019-04-02 ENCOUNTER — Encounter: Payer: Self-pay | Admitting: Physical Therapy

## 2019-04-02 DIAGNOSIS — I6990 Unspecified sequelae of unspecified cerebrovascular disease: Secondary | ICD-10-CM | POA: Diagnosis not present

## 2019-04-02 DIAGNOSIS — R2242 Localized swelling, mass and lump, left lower limb: Secondary | ICD-10-CM | POA: Diagnosis not present

## 2019-04-02 DIAGNOSIS — M25662 Stiffness of left knee, not elsewhere classified: Secondary | ICD-10-CM | POA: Diagnosis not present

## 2019-04-02 DIAGNOSIS — I69354 Hemiplegia and hemiparesis following cerebral infarction affecting left non-dominant side: Secondary | ICD-10-CM

## 2019-04-02 DIAGNOSIS — R262 Difficulty in walking, not elsewhere classified: Secondary | ICD-10-CM

## 2019-04-02 DIAGNOSIS — M25572 Pain in left ankle and joints of left foot: Secondary | ICD-10-CM | POA: Diagnosis not present

## 2019-04-02 DIAGNOSIS — M6281 Muscle weakness (generalized): Secondary | ICD-10-CM | POA: Diagnosis not present

## 2019-04-02 NOTE — Therapy (Signed)
[place] [address] [place], [place], [address] Phone: [phone]   Fax:  [phone]  Physical Therapy Treatment  Patient Details  Name: Karen Jackson MRN: [id number] Date of Birth: September 04, 1940 Referring Provider (PT): Lucey   Encounter Date: 04/02/2019  PT End of Session - 04/02/19 2423    Visit Number  12    Date for PT Re-Evaluation  05/04/19    PT Start Time  1056    PT Stop Time  1143    PT Time Calculation (min)  47 min    Activity Tolerance  Patient limited by fatigue       Past Medical History:  Diagnosis Date  . Acute cystitis without hematuria   . Acute diastolic CHF (congestive heart failure) ([place])   . Arthritis   . Dyspnea   . Dysrhythmia   . Fever of unknown origin 03/19/2017  . Hyperlipidemia   . Hypertension    denies at preop  . Multifocal pneumonia   . Neuromuscular disorder ([place])    neuropathy left arm and foot  . Osteopenia   . Paralysis ([place])    partial left side from CVA   . Persistent atrial fibrillation   . PONV (postoperative nausea and vomiting)   . Pre-diabetes   . Stroke [facility]) 2013   hemmorahgic    Past Surgical History:  Procedure Laterality Date  . ANKLE SURGERY    . APPENDECTOMY    . CHOLECYSTECTOMY    . HERNIA REPAIR     Esophagus  . JOINT REPLACEMENT     total- right partial- left  . MASTECTOMY PARTIAL / LUMPECTOMY  2012   left  . ORIF ANKLE FRACTURE Left 07/20/2018   Procedure: OPEN REDUCTION INTERNAL FIXATION (ORIF) ANKLE FRACTURE;  Surgeon: Wylene Simmer, MD;  Location: [facility];  Service: Orthopedics;  Laterality: Left;  . TOTAL KNEE ARTHROPLASTY Left 01/27/2019   Procedure: TOTAL KNEE ARTHROPLASTY;  Surgeon: Vickey Huger, MD;  [facility];  Service: Orthopedics;  Laterality: Left;    There were no vitals filed for this visit.  Subjective Assessment - 04/02/19 1206    Subjective  Reports that she was very tired after Monday    Currently in Pain?  Yes     Pain Score  3     Pain Location  Knee    Pain Orientation  Left                       [facility] Adult PT Treatment/Exercise - 04/02/19 0001      Ambulation/Gait   Gait Comments  gait today with HHA 35', 30' and then 18', at teh end she was very fatigued, we did some practicing with her standing and trying to push the walker forward and then pull it back with a brace on the walker and her hand tied to the walker., this did okay but I am not sure what will be the final outcome of her walking on own with walker      Knee/Hip Exercises: Aerobic   Nustep  level 5 x 7 minutes      Knee/Hip Exercises: Seated   Long Arc Quad  Strengthening;Left;10 reps;2 sets    [employer] Weight  5 lbs.    Long [employer] Limitations  cues for the [employer]  25    Other Seated Knee/Hip Exercises  ankle red tband exercises    Other  Seated Knee/Hip Exercises  bosu behind her crunches    Marching  Strengthening;Left;2 sets;10 reps    Hamstring Curl  Strengthening;Both;2 sets;10 reps    Hamstring Limitations  red tband               PT Short Term Goals - 03/20/19 1140      PT SHORT TERM GOAL #1   Title  independent with initial HEP    Status  Achieved        PT Long Term Goals - 03/26/19 1148      PT LONG TERM GOAL #1   Title  walk with hand hold assist x 100 feet    Status  On-going      PT LONG TERM GOAL #5   Title  report no difficulty pulling her pants up when going to the bathroom    Status  On-going            Plan - 04/02/19 1242    Clinical Impression Statement  Patient really fatigues with walking.  She brought in some braces that were made for her a few years ago to help with the hand on the walker.  She is missing some pieces and I contacted the OT that made them.    PT Next Visit Plan  will work to advance her as we can, will add left arm exercises to help with her gait and use of the hand on the walker    Consulted and Agree with Plan of Care   Patient       Patient will benefit from skilled therapeutic intervention in order to improve the following deficits and impairments:  Abnormal gait, Decreased coordination, Decreased range of motion, Difficulty walking, Cardiopulmonary status limiting activity, Decreased endurance, Increased muscle spasms, Impaired UE functional use, Decreased activity tolerance, Pain, Decreased balance, Postural dysfunction, Decreased strength, Decreased mobility  Visit Diagnosis: 1. Difficulty in walking, not elsewhere classified   2. Stiffness of left knee, not elsewhere classified   3. Muscle weakness (generalized)   4. Pain in left ankle and joints of left foot   5. Late effects of CVA (cerebrovascular accident)   6. Hemiplegia and hemiparesis following cerebral infarction affecting left non-dominant side [facility])        Problem List Patient Active Problem List   Diagnosis Date Noted  . S/P total knee replacement 01/27/2019  . Closed left ankle fracture 07/20/2018  . Acute ankle pain 05/23/2018  . Respiratory failure with hypoxia ([place]) 08/30/2017  . UTI (urinary tract infection) 07/05/2017  . Hypoxemia   . Heart failure with preserved ejection fraction ([place]), Grade 3 diastolic dysfunction 93/26/7124  . Atrial fibrillation with RVR ([place])   . PAF (paroxysmal atrial fibrillation) ([name])   . Metabolic syndrome 58/04/9832  . Encounter for preventive health examination 02/17/2016  . Morbid obesity ([place]) 06/17/2015  . Hypomagnesemia 04/24/2014  . Hemiparesis affecting left side as late effect of cerebrovascular accident ([place]) 04/24/2014  . Nontraumatic cerebral hemorrhage ([place]) 04/30/2012  . DM (diabetes mellitus) with complications ([place]) 82/50/5397  . OSTEOPENIA 01/21/2009  . UNSPECIFIED VITAMIN D DEFICIENCY 11/19/2007  . ESSENTIAL HYPERTENSION, BENIGN 11/19/2007  . HYPERCHOLESTEROLEMIA 10/25/2006  . GASTROESOPHAGEAL REFLUX, NO ESOPHAGITIS 10/25/2006  . DIVERTICULOSIS OF COLON 10/25/2006  .  Osteoarthritis 10/25/2006  . CERVICAL SPINE DISORDER, NOS 10/25/2006    Sumner Boast., PT 04/02/2019, 1:02 PM  [place] [address] [place], [place], [address] Phone: [phone]   Fax:  [phone]  Name: ALVETTA HIDROGO MRN: [id number] Date of Birth: February 01, 1941

## 2019-04-04 ENCOUNTER — Ambulatory Visit: Payer: Medicare Other | Admitting: Physical Therapy

## 2019-04-04 ENCOUNTER — Other Ambulatory Visit: Payer: Self-pay

## 2019-04-04 DIAGNOSIS — M6281 Muscle weakness (generalized): Secondary | ICD-10-CM

## 2019-04-04 DIAGNOSIS — M25572 Pain in left ankle and joints of left foot: Secondary | ICD-10-CM | POA: Diagnosis not present

## 2019-04-04 DIAGNOSIS — M25662 Stiffness of left knee, not elsewhere classified: Secondary | ICD-10-CM | POA: Diagnosis not present

## 2019-04-04 DIAGNOSIS — R2242 Localized swelling, mass and lump, left lower limb: Secondary | ICD-10-CM | POA: Diagnosis not present

## 2019-04-04 DIAGNOSIS — I6990 Unspecified sequelae of unspecified cerebrovascular disease: Secondary | ICD-10-CM | POA: Diagnosis not present

## 2019-04-04 DIAGNOSIS — R262 Difficulty in walking, not elsewhere classified: Secondary | ICD-10-CM

## 2019-04-04 NOTE — Therapy (Signed)
[address] [place], [place], [address] Phone: [phone]   Fax:  [phone]  Physical Therapy Treatment  Patient Details  Name: Karen Jackson MRN: [id number] Date of Birth: 1941-07-11 Referring Provider (PT): Lucey   Encounter Date: 04/04/2019  PT End of Session - 04/04/19 1142    Visit Number  13    Date for PT Re-Evaluation  05/04/19    PT Start Time  1055    PT Stop Time  1142    PT Time Calculation (min)  47 min       Past Medical History:  Diagnosis Date  . Acute cystitis without hematuria   . Acute diastolic CHF (congestive heart failure) ([place])   . Arthritis   . Dyspnea   . Dysrhythmia   . Fever of unknown origin 03/19/2017  . Hyperlipidemia   . Hypertension    denies at preop  . Multifocal pneumonia   . Neuromuscular disorder ([place])    neuropathy left arm and foot  . Osteopenia   . Paralysis ([place])    partial left side from CVA   . Persistent atrial fibrillation   . PONV (postoperative nausea and vomiting)   . Pre-diabetes   . Stroke [facility]) 2013   hemmorahgic    Past Surgical History:  Procedure Laterality Date  . ANKLE SURGERY    . APPENDECTOMY    . CHOLECYSTECTOMY    . HERNIA REPAIR     Esophagus  . JOINT REPLACEMENT     total- right partial- left  . MASTECTOMY PARTIAL / LUMPECTOMY  2012   left  . ORIF ANKLE FRACTURE Left 07/20/2018   Procedure: OPEN REDUCTION INTERNAL FIXATION (ORIF) ANKLE FRACTURE;  Surgeon: Wylene Simmer, MD;  Location: [place];  Service: Orthopedics;  Laterality: Left;  . TOTAL KNEE ARTHROPLASTY Left 01/27/2019   Procedure: TOTAL KNEE ARTHROPLASTY;  Surgeon: Vickey Huger, MD;  [facility];  Service: Orthopedics;  Laterality: Left;    There were no vitals filed for this visit.  Subjective Assessment - 04/04/19 1104    Subjective  feeling good just sore in knee, ankle brace is good    Pain Location  Knee    Pain Orientation  Left                        [facility] Adult PT Treatment/Exercise - 04/04/19 0001      Ambulation/Gait   Gait Comments  gait today with HHA 40',40' and 30 feet   increased upright posture and self cuing.      Knee/Hip Exercises: Aerobic   Nustep  level 5 x 7 minutes      Knee/Hip Exercises: Machines for Strengthening   Cybex Knee Flexion  knee ext 5# 4 set s5   knee flex 15# 2 sets 10              PT Short Term Goals - 03/20/19 1140      PT SHORT TERM GOAL #1   Title  independent with initial HEP    Status  Achieved        PT Long Term Goals - 03/26/19 1148      PT LONG TERM GOAL #1   Title  walk with hand hold assist x 100 feet    Status  On-going      PT LONG TERM GOAL #5   Title  report no difficulty pulling her pants up when going  to the bathroom    Status  On-going            Plan - 04/04/19 1142    Clinical Impression Statement  improved gait distance with less knee instability and urgency to sit. need to try and move away from w/c behind to increase confidence. pt with noted improvement with transfers- min A with less cuing and better eccentric control. pt able to use machines today    PT Treatment/Interventions  ADLs/Self Care Home Management;Cryotherapy;Electrical Stimulation;Iontophoresis 4mg /ml Dexamethasone;Moist Heat;Ultrasound;Gait training;Balance training;Therapeutic exercise;Therapeutic activities;Functional mobility training;Patient/family education;Manual techniques;Dry needling    PT Next Visit Plan  progress transfers and gait to increase independance       Patient will benefit from skilled therapeutic intervention in order to improve the following deficits and impairments:  Abnormal gait, Decreased coordination, Decreased range of motion, Difficulty walking, Cardiopulmonary status limiting activity, Decreased endurance, Increased muscle spasms, Impaired UE functional use, Decreased activity tolerance, Pain, Decreased balance, Postural  dysfunction, Decreased strength, Decreased mobility  Visit Diagnosis: 1. Difficulty in walking, not elsewhere classified   2. Muscle weakness (generalized)        Problem List Patient Active Problem List   Diagnosis Date Noted  . S/P total knee replacement 01/27/2019  . Closed left ankle fracture 07/20/2018  . Acute ankle pain 05/23/2018  . Respiratory failure with hypoxia ([place]) 08/30/2017  . UTI (urinary tract infection) 07/05/2017  . Hypoxemia   . Heart failure with preserved ejection fraction ([place]), Grade 3 diastolic dysfunction 29/92/4268  . Atrial fibrillation with RVR ([place])   . PAF (paroxysmal atrial fibrillation) ([place])   . Metabolic syndrome 34/19/6222  . Encounter for preventive health examination 02/17/2016  . Morbid obesity ([place]) 06/17/2015  . Hypomagnesemia 04/24/2014  . Hemiparesis affecting left side as late effect of cerebrovascular accident ([place]) 04/24/2014  . Nontraumatic cerebral hemorrhage ([place]) 04/30/2012  . DM (diabetes mellitus) with complications ([place]) 97/98/9211  . OSTEOPENIA 01/21/2009  . UNSPECIFIED VITAMIN D DEFICIENCY 11/19/2007  . ESSENTIAL HYPERTENSION, BENIGN 11/19/2007  . HYPERCHOLESTEROLEMIA 10/25/2006  . GASTROESOPHAGEAL REFLUX, NO ESOPHAGITIS 10/25/2006  . DIVERTICULOSIS OF COLON 10/25/2006  . Osteoarthritis 10/25/2006  . CERVICAL SPINE DISORDER, NOS 10/25/2006    PAYSEUR,ANGIE PTA 04/04/2019, 11:47 AM  [address] [place], [place], [address] Phone: [phone]   Fax:  [phone]  Name: Karen Jackson MRN: [id number] Date of Birth: March 06, 1941

## 2019-04-07 ENCOUNTER — Ambulatory Visit: Payer: Medicare Other | Admitting: Physical Therapy

## 2019-04-07 ENCOUNTER — Other Ambulatory Visit: Payer: Self-pay

## 2019-04-07 ENCOUNTER — Encounter: Payer: Self-pay | Admitting: Physical Therapy

## 2019-04-07 DIAGNOSIS — M25572 Pain in left ankle and joints of left foot: Secondary | ICD-10-CM | POA: Diagnosis not present

## 2019-04-07 DIAGNOSIS — I69354 Hemiplegia and hemiparesis following cerebral infarction affecting left non-dominant side: Secondary | ICD-10-CM

## 2019-04-07 DIAGNOSIS — R262 Difficulty in walking, not elsewhere classified: Secondary | ICD-10-CM

## 2019-04-07 DIAGNOSIS — M6281 Muscle weakness (generalized): Secondary | ICD-10-CM | POA: Diagnosis not present

## 2019-04-07 DIAGNOSIS — M25662 Stiffness of left knee, not elsewhere classified: Secondary | ICD-10-CM | POA: Diagnosis not present

## 2019-04-07 DIAGNOSIS — R2242 Localized swelling, mass and lump, left lower limb: Secondary | ICD-10-CM | POA: Diagnosis not present

## 2019-04-07 DIAGNOSIS — I6990 Unspecified sequelae of unspecified cerebrovascular disease: Secondary | ICD-10-CM

## 2019-04-07 NOTE — Therapy (Signed)
[address] [place], [place], [address] Phone: [phone]   Fax:  [phone]  Physical Therapy Treatment  Patient Details  Name: Karen Jackson MRN: [id number] Date of Birth: 08-22-1941 Referring Provider (PT): Lucey   Encounter Date: 04/07/2019  PT End of Session - 04/07/19 1149    Visit Number  14    Date for PT Re-Evaluation  05/04/19    PT Start Time  1057    PT Stop Time  1143    PT Time Calculation (min)  46 min    Activity Tolerance  Patient limited by fatigue    Behavior During Therapy  [facility] for tasks assessed/performed       Past Medical History:  Diagnosis Date  . Acute cystitis without hematuria   . Acute diastolic CHF (congestive heart failure) ([place])   . Arthritis   . Dyspnea   . Dysrhythmia   . Fever of unknown origin 03/19/2017  . Hyperlipidemia   . Hypertension    denies at preop  . Multifocal pneumonia   . Neuromuscular disorder ([place])    neuropathy left arm and foot  . Osteopenia   . Paralysis ([place])    partial left side from CVA   . Persistent atrial fibrillation   . PONV (postoperative nausea and vomiting)   . Pre-diabetes   . Stroke [facility]) 2013   hemmorahgic    Past Surgical History:  Procedure Laterality Date  . ANKLE SURGERY    . APPENDECTOMY    . CHOLECYSTECTOMY    . HERNIA REPAIR     Esophagus  . JOINT REPLACEMENT     total- right partial- left  . MASTECTOMY PARTIAL / LUMPECTOMY  2012   left  . ORIF ANKLE FRACTURE Left 07/20/2018   Procedure: OPEN REDUCTION INTERNAL FIXATION (ORIF) ANKLE FRACTURE;  Surgeon: Wylene Simmer, MD;  Location: [place];  Service: Orthopedics;  Laterality: Left;  . TOTAL KNEE ARTHROPLASTY Left 01/27/2019   Procedure: TOTAL KNEE ARTHROPLASTY;  Surgeon: Vickey Huger, MD;  [facility];  Service: Orthopedics;  Laterality: Left;    There were no vitals filed for this visit.  Subjective Assessment - 04/07/19 1102    Subjective  Patient  reports that things are going well    Currently in Pain?  No/denies    Pain Location  Knee    Pain Orientation  Left    Pain Descriptors / Indicators  Sore                       [facility] Adult PT Treatment/Exercise - 04/07/19 0001      Ambulation/Gait   Gait Comments  gait HHA  with one person following with w/c 40', 30' and 30'      Knee/Hip Exercises: Aerobic   Nustep  level 5 x 7 minutes      Knee/Hip Exercises: Seated   Long Arc Quad  Strengthening;Left;10 reps;2 sets    [employer] Weight  5 lbs.    Other Seated Knee/Hip Exercises  ankle red tband exercises    Other Seated Knee/Hip Exercises  bosu behind her crunches    Marching  Strengthening;Left;2 sets;10 reps    Hamstring Curl  Strengthening;Both;2 sets;10 reps    Hamstring Limitations  red tband               PT Short Term Goals - 03/20/19 1140      PT SHORT TERM GOAL #1  Title  independent with initial HEP    Status  Achieved        PT Long Term Goals - 03/26/19 1148      PT LONG TERM GOAL #1   Title  walk with hand hold assist x 100 feet    Status  On-going      PT LONG TERM GOAL #5   Title  report no difficulty pulling her pants up when going to the bathroom    Status  On-going            Plan - 04/07/19 1149    Clinical Impression Statement  Today patient went back to some of her bad habits, she turns the left leg out and in doing so puts pressure on the leg and she is unable to use the mms to support her and her knee buckles, Cues to keep a narrower BOS and to stay on top of the leg help as when she does not it turns out and the knee buckles.  She definitely was more fatigued today but does not have her regular caregivers and was not up out of bed much    PT Next Visit Plan  progress transfers and gait to increase independance    Consulted and Agree with Plan of Care  Patient       Patient will benefit from skilled therapeutic intervention in order to improve the  following deficits and impairments:  Abnormal gait, Decreased coordination, Decreased range of motion, Difficulty walking, Cardiopulmonary status limiting activity, Decreased endurance, Increased muscle spasms, Impaired UE functional use, Decreased activity tolerance, Pain, Decreased balance, Postural dysfunction, Decreased strength, Decreased mobility  Visit Diagnosis: 1. Difficulty in walking, not elsewhere classified   2. Muscle weakness (generalized)   3. Stiffness of left knee, not elsewhere classified   4. Pain in left ankle and joints of left foot   5. Late effects of CVA (cerebrovascular accident)   6. Hemiplegia and hemiparesis following cerebral infarction affecting left non-dominant side [facility])        Problem List Patient Active Problem List   Diagnosis Date Noted  . S/P total knee replacement 01/27/2019  . Closed left ankle fracture 07/20/2018  . Acute ankle pain 05/23/2018  . Respiratory failure with hypoxia ([place]) 08/30/2017  . UTI (urinary tract infection) 07/05/2017  . Hypoxemia   . Heart failure with preserved ejection fraction ([place]), Grade 3 diastolic dysfunction 73/53/2992  . Atrial fibrillation with RVR ([place])   . PAF (paroxysmal atrial fibrillation) ([place])   . Metabolic syndrome 42/68/3419  . Encounter for preventive health examination 02/17/2016  . Morbid obesity ([place]) 06/17/2015  . Hypomagnesemia 04/24/2014  . Hemiparesis affecting left side as late effect of cerebrovascular accident ([place]) 04/24/2014  . Nontraumatic cerebral hemorrhage ([place]) 04/30/2012  . DM (diabetes mellitus) with complications ([place]) 62/22/9798  . OSTEOPENIA 01/21/2009  . UNSPECIFIED VITAMIN D DEFICIENCY 11/19/2007  . ESSENTIAL HYPERTENSION, BENIGN 11/19/2007  . HYPERCHOLESTEROLEMIA 10/25/2006  . GASTROESOPHAGEAL REFLUX, NO ESOPHAGITIS 10/25/2006  . DIVERTICULOSIS OF COLON 10/25/2006  . Osteoarthritis 10/25/2006  . CERVICAL SPINE DISORDER, NOS 10/25/2006    Sumner Boast.,  PT 04/07/2019, 11:53 AM  [address] [place], [place], [address] Phone: [phone]   Fax:  [phone]  Name: KIELEE CARE MRN: [id number] Date of Birth: 1941/04/19

## 2019-04-11 ENCOUNTER — Other Ambulatory Visit: Payer: Self-pay

## 2019-04-11 ENCOUNTER — Ambulatory Visit: Payer: Medicare Other | Admitting: Physical Therapy

## 2019-04-11 DIAGNOSIS — M25572 Pain in left ankle and joints of left foot: Secondary | ICD-10-CM

## 2019-04-11 DIAGNOSIS — I6990 Unspecified sequelae of unspecified cerebrovascular disease: Secondary | ICD-10-CM | POA: Diagnosis not present

## 2019-04-11 DIAGNOSIS — R262 Difficulty in walking, not elsewhere classified: Secondary | ICD-10-CM | POA: Diagnosis not present

## 2019-04-11 DIAGNOSIS — M6281 Muscle weakness (generalized): Secondary | ICD-10-CM

## 2019-04-11 DIAGNOSIS — M25662 Stiffness of left knee, not elsewhere classified: Secondary | ICD-10-CM

## 2019-04-11 DIAGNOSIS — R2242 Localized swelling, mass and lump, left lower limb: Secondary | ICD-10-CM | POA: Diagnosis not present

## 2019-04-11 NOTE — Therapy (Signed)
[address] [place], [place], [address] Phone: [phone]   Fax:  [phone]  Physical Therapy Treatment  Patient Details  Name: Karen Jackson MRN: [id number] Date of Birth: 1941-04-24 Referring Provider (PT): Lucey   Encounter Date: 04/11/2019  PT End of Session - 04/11/19 1142    Visit Number  15    Date for PT Re-Evaluation  05/04/19    PT Start Time  1057    PT Stop Time  1141    PT Time Calculation (min)  44 min       Past Medical History:  Diagnosis Date  . Acute cystitis without hematuria   . Acute diastolic CHF (congestive heart failure) ([place])   . Arthritis   . Dyspnea   . Dysrhythmia   . Fever of unknown origin 03/19/2017  . Hyperlipidemia   . Hypertension    denies at preop  . Multifocal pneumonia   . Neuromuscular disorder ([place])    neuropathy left arm and foot  . Osteopenia   . Paralysis ([place])    partial left side from CVA   . Persistent atrial fibrillation   . PONV (postoperative nausea and vomiting)   . Pre-diabetes   . Stroke [facility]) 2013   hemmorahgic    Past Surgical History:  Procedure Laterality Date  . ANKLE SURGERY    . APPENDECTOMY    . CHOLECYSTECTOMY    . HERNIA REPAIR     Esophagus  . JOINT REPLACEMENT     total- right partial- left  . MASTECTOMY PARTIAL / LUMPECTOMY  2012   left  . ORIF ANKLE FRACTURE Left 07/20/2018   Procedure: OPEN REDUCTION INTERNAL FIXATION (ORIF) ANKLE FRACTURE;  Surgeon: Wylene Simmer, MD;  Location: [place];  Service: Orthopedics;  Laterality: Left;  . TOTAL KNEE ARTHROPLASTY Left 01/27/2019   Procedure: TOTAL KNEE ARTHROPLASTY;  Surgeon: Vickey Huger, MD;  [facility];  Service: Orthopedics;  Laterality: Left;    There were no vitals filed for this visit.  Subjective Assessment - 04/11/19 1105    Subjective  doing okay    Currently in Pain?  Yes    Pain Score  3     Pain Location  Knee    Pain Orientation  Left                        [facility] Adult PT Treatment/Exercise - 04/11/19 0001      Transfers   Comments  w/c to and from mat with RW    min A with cuing     Ambulation/Gait   Gait Comments  gait HHA  with one person following with w/c 40',    then 2 times 12 feet with RW min A     High Level Balance   High Level Balance Comments  standing ball tap min A working on maintaining upright posture and righting with LOB   noted improved rihting with LOB      Knee/Hip Exercises: Aerobic   Nustep  level 5 x 7 minutes      Knee/Hip Exercises: Seated   Long Arc Quad  Strengthening;Left;10 reps;3 sets    [employer] Weight  5 lbs.    Long Arc Quad Limitations  TKE red tband 15 x    Hamstring Curl  Strengthening;Both;2 sets;10 reps    Hamstring Limitations  red tband  PT Short Term Goals - 03/20/19 1140      PT SHORT TERM GOAL #1   Title  independent with initial HEP    Status  Achieved        PT Long Term Goals - 03/26/19 1148      PT LONG TERM GOAL #1   Title  walk with hand hold assist x 100 feet    Status  On-going      PT LONG TERM GOAL #5   Title  report no difficulty pulling her pants up when going to the bathroom    Status  On-going            Plan - 04/11/19 1142    Clinical Impression Statement  pt abl eot amb with RW 2 times 2 day with good hand grip, decreased stride on walker vs HHA. worked on transfers w/c to/from mat with RW min A but able to [employer] walker fairly well.    PT Treatment/Interventions  ADLs/Self Care Home Management;Cryotherapy;Electrical Stimulation;Iontophoresis 4mg /ml Dexamethasone;Moist Heat;Ultrasound;Gait training;Balance training;Therapeutic exercise;Therapeutic activities;Functional mobility training;Patient/family education;Manual techniques;Dry needling    PT Next Visit Plan  progress transfers and gait to increase independance       Patient will benefit from skilled therapeutic intervention in  order to improve the following deficits and impairments:  Abnormal gait, Decreased coordination, Decreased range of motion, Difficulty walking, Cardiopulmonary status limiting activity, Decreased endurance, Increased muscle spasms, Impaired UE functional use, Decreased activity tolerance, Pain, Decreased balance, Postural dysfunction, Decreased strength, Decreased mobility  Visit Diagnosis: 1. Difficulty in walking, not elsewhere classified   2. Muscle weakness (generalized)   3. Stiffness of left knee, not elsewhere classified   4. Pain in left ankle and joints of left foot        Problem List Patient Active Problem List   Diagnosis Date Noted  . S/P total knee replacement 01/27/2019  . Closed left ankle fracture 07/20/2018  . Acute ankle pain 05/23/2018  . Respiratory failure with hypoxia ([place]) 08/30/2017  . UTI (urinary tract infection) 07/05/2017  . Hypoxemia   . Heart failure with preserved ejection fraction ([place]), Grade 3 diastolic dysfunction 24/40/1027  . Atrial fibrillation with RVR ([place])   . PAF (paroxysmal atrial fibrillation) ([place])   . Metabolic syndrome 25/36/6440  . Encounter for preventive health examination 02/17/2016  . Morbid obesity ([place]) 06/17/2015  . Hypomagnesemia 04/24/2014  . Hemiparesis affecting left side as late effect of cerebrovascular accident ([place]) 04/24/2014  . Nontraumatic cerebral hemorrhage ([place]) 04/30/2012  . DM (diabetes mellitus) with complications ([place]) 34/74/2595  . OSTEOPENIA 01/21/2009  . UNSPECIFIED VITAMIN D DEFICIENCY 11/19/2007  . ESSENTIAL HYPERTENSION, BENIGN 11/19/2007  . HYPERCHOLESTEROLEMIA 10/25/2006  . GASTROESOPHAGEAL REFLUX, NO ESOPHAGITIS 10/25/2006  . DIVERTICULOSIS OF COLON 10/25/2006  . Osteoarthritis 10/25/2006  . CERVICAL SPINE DISORDER, NOS 10/25/2006    ,ANGIE PTA 04/11/2019, 11:44 AM  [address] [place], [place],  [address] Phone: [phone]   Fax:  [phone]  Name: HAZEL LEVEILLE MRN: [id number] Date of Birth: [birth year]

## 2019-04-14 ENCOUNTER — Ambulatory Visit: Payer: Medicare Other | Admitting: Physical Therapy

## 2019-04-14 ENCOUNTER — Other Ambulatory Visit: Payer: Self-pay

## 2019-04-14 ENCOUNTER — Encounter: Payer: Self-pay | Admitting: Physical Therapy

## 2019-04-14 DIAGNOSIS — R2242 Localized swelling, mass and lump, left lower limb: Secondary | ICD-10-CM | POA: Diagnosis not present

## 2019-04-14 DIAGNOSIS — R262 Difficulty in walking, not elsewhere classified: Secondary | ICD-10-CM | POA: Diagnosis not present

## 2019-04-14 DIAGNOSIS — M25662 Stiffness of left knee, not elsewhere classified: Secondary | ICD-10-CM

## 2019-04-14 DIAGNOSIS — M25572 Pain in left ankle and joints of left foot: Secondary | ICD-10-CM | POA: Diagnosis not present

## 2019-04-14 DIAGNOSIS — M6281 Muscle weakness (generalized): Secondary | ICD-10-CM | POA: Diagnosis not present

## 2019-04-14 DIAGNOSIS — I69354 Hemiplegia and hemiparesis following cerebral infarction affecting left non-dominant side: Secondary | ICD-10-CM

## 2019-04-14 DIAGNOSIS — I6990 Unspecified sequelae of unspecified cerebrovascular disease: Secondary | ICD-10-CM | POA: Diagnosis not present

## 2019-04-14 NOTE — Therapy (Signed)
[address] [address] [place], [place], [address] Phone: [phone]   Fax:  [phone]  Physical Therapy Treatment  Patient Details  Name: Karen Jackson MRN: [id number] Date of Birth: 1941/07/21 Referring Provider (PT): Lucey   Encounter Date: 04/14/2019  PT End of Session - 04/14/19 1142    Visit Number  16    Date for PT Re-Evaluation  05/04/19    PT Start Time  1057    PT Stop Time  1140    PT Time Calculation (min)  43 min    Activity Tolerance  Patient limited by fatigue       Past Medical History:  Diagnosis Date  . Acute cystitis without hematuria   . Acute diastolic CHF (congestive heart failure) ([place])   . Arthritis   . Dyspnea   . Dysrhythmia   . Fever of unknown origin 03/19/2017  . Hyperlipidemia   . Hypertension    denies at preop  . Multifocal pneumonia   . Neuromuscular disorder ([place])    neuropathy left arm and foot  . Osteopenia   . Paralysis ([place])    partial left side from CVA   . Persistent atrial fibrillation   . PONV (postoperative nausea and vomiting)   . Pre-diabetes   . Stroke [facility]) 2013   hemmorahgic    Past Surgical History:  Procedure Laterality Date  . ANKLE SURGERY    . APPENDECTOMY    . CHOLECYSTECTOMY    . HERNIA REPAIR     Esophagus  . JOINT REPLACEMENT     total- right partial- left  . MASTECTOMY PARTIAL / LUMPECTOMY  2012   left  . ORIF ANKLE FRACTURE Left 07/20/2018   Procedure: OPEN REDUCTION INTERNAL FIXATION (ORIF) ANKLE FRACTURE;  Surgeon: Wylene Simmer, MD;  Location: [place];  Service: Orthopedics;  Laterality: Left;  . TOTAL KNEE ARTHROPLASTY Left 01/27/2019   Procedure: TOTAL KNEE ARTHROPLASTY;  Surgeon: Vickey Huger, MD;  [facility];  Service: Orthopedics;  Laterality: Left;    There were no vitals filed for this visit.  Subjective Assessment - 04/14/19 1104    Subjective  My shoulders have really hurt over the weekend    Currently in Pain?   Yes    Pain Score  5     Pain Location  Shoulder    Pain Orientation  Right;Left    Aggravating Factors   unsure of why the shoulders really started hurting                       [facility] Adult PT Treatment/Exercise - 04/14/19 0001      Transfers   Comments  all transfers with HHA, seems to be doing much better with these and more conficent      Ambulation/Gait   Gait Comments  gait with walker her trying to advance, x 16 feet, then with HHA 40', then 36 feet      Knee/Hip Exercises: Aerobic   Nustep  level 5 x 7 minutes      Knee/Hip Exercises: Seated   Long Arc Quad  Strengthening;Left;10 reps;3 sets    [employer] Weight  5 lbs.    Other Seated Knee/Hip Exercises  ankle red tband exercises    Other Seated Knee/Hip Exercises  bosu behind her crunches, left wrist with 2# reaching and stacking cones to help with future use of walker    Marching  Strengthening;Left;2 sets;10 reps  Marching Weights  3 lbs.    Hamstring Curl  Strengthening;Both;2 sets;10 reps    Hamstring Limitations  red tband               PT Short Term Goals - 03/20/19 1140      PT SHORT TERM GOAL #1   Title  independent with initial HEP    Status  Achieved        PT Long Term Goals - 03/26/19 1148      PT LONG TERM GOAL #1   Title  walk with hand hold assist x 100 feet    Status  On-going      PT LONG TERM GOAL #5   Title  report no difficulty pulling her pants up when going to the bathroom    Status  On-going            Plan - 04/14/19 1143    Clinical Impression Statement  Patient seems to be more confident in the transfers, she moves faster and less pulling on PT when doing these compared to two weeks ago.  We have added some left UE exercises to help the left hand with the walker, use of the walker will help her be more independent, at this time she can do it but it is very small advancement and very small steps taking a lot of energy to go a short distance.     PT Next Visit Plan  progress transfers and gait to increase independance    Consulted and Agree with Plan of Care  Patient       Patient will benefit from skilled therapeutic intervention in order to improve the following deficits and impairments:  Abnormal gait, Decreased coordination, Decreased range of motion, Difficulty walking, Cardiopulmonary status limiting activity, Decreased endurance, Increased muscle spasms, Impaired UE functional use, Decreased activity tolerance, Pain, Decreased balance, Postural dysfunction, Decreased strength, Decreased mobility  Visit Diagnosis: 1. Difficulty in walking, not elsewhere classified   2. Muscle weakness (generalized)   3. Stiffness of left knee, not elsewhere classified   4. Late effects of CVA (cerebrovascular accident)   5. Hemiplegia and hemiparesis following cerebral infarction affecting left non-dominant side [facility])        Problem List Patient Active Problem List   Diagnosis Date Noted  . S/P total knee replacement 01/27/2019  . Closed left ankle fracture 07/20/2018  . Acute ankle pain 05/23/2018  . Respiratory failure with hypoxia ([place]) 08/30/2017  . UTI (urinary tract infection) 07/05/2017  . Hypoxemia   . Heart failure with preserved ejection fraction ([place]), Grade 3 diastolic dysfunction 16/05/9603  . Atrial fibrillation with RVR ([place])   . PAF (paroxysmal atrial fibrillation) ([place])   . Metabolic syndrome 54/04/8118  . Encounter for preventive health examination 02/17/2016  . Morbid obesity ([place]) 06/17/2015  . Hypomagnesemia 04/24/2014  . Hemiparesis affecting left side as late effect of cerebrovascular accident ([place]) 04/24/2014  . Nontraumatic cerebral hemorrhage ([place]) 04/30/2012  . DM (diabetes mellitus) with complications ([place]) 14/78/2956  . OSTEOPENIA 01/21/2009  . UNSPECIFIED VITAMIN D DEFICIENCY 11/19/2007  . ESSENTIAL HYPERTENSION, BENIGN 11/19/2007  . HYPERCHOLESTEROLEMIA 10/25/2006  . GASTROESOPHAGEAL REFLUX, NO  ESOPHAGITIS 10/25/2006  . DIVERTICULOSIS OF COLON 10/25/2006  . Osteoarthritis 10/25/2006  . CERVICAL SPINE DISORDER, NOS 10/25/2006    Sumner Boast., PT 04/14/2019, 11:45 AM  [address] [place] [address] [place], [place], [address] Phone: [phone]   Fax:  [phone]  Name: Uva Runkel  [place] MRN: [id number] Date of Birth: July 01, 1941

## 2019-04-16 ENCOUNTER — Other Ambulatory Visit: Payer: Self-pay

## 2019-04-16 ENCOUNTER — Ambulatory Visit: Payer: Medicare Other | Admitting: Physical Therapy

## 2019-04-16 ENCOUNTER — Encounter: Payer: Self-pay | Admitting: Physical Therapy

## 2019-04-16 DIAGNOSIS — R2242 Localized swelling, mass and lump, left lower limb: Secondary | ICD-10-CM

## 2019-04-16 DIAGNOSIS — M25662 Stiffness of left knee, not elsewhere classified: Secondary | ICD-10-CM

## 2019-04-16 DIAGNOSIS — M6281 Muscle weakness (generalized): Secondary | ICD-10-CM

## 2019-04-16 DIAGNOSIS — I6990 Unspecified sequelae of unspecified cerebrovascular disease: Secondary | ICD-10-CM

## 2019-04-16 DIAGNOSIS — M25572 Pain in left ankle and joints of left foot: Secondary | ICD-10-CM | POA: Diagnosis not present

## 2019-04-16 DIAGNOSIS — M25562 Pain in left knee: Secondary | ICD-10-CM

## 2019-04-16 DIAGNOSIS — R262 Difficulty in walking, not elsewhere classified: Secondary | ICD-10-CM

## 2019-04-16 NOTE — Therapy (Signed)
[place], [place], [address] Phone: [phone]   Fax:  [phone]  Physical Therapy Treatment  Patient Details  Name: Karen Jackson MRN: [id number] Date of Birth: [birth year] Referring Provider (PT): Lucey   Encounter Date: 04/16/2019  PT End of Session - 04/16/19 1151    Visit Number  17    Date for PT Re-Evaluation  05/04/19    PT Start Time  1057    PT Stop Time  1141    PT Time Calculation (min)  44 min    Activity Tolerance  Patient tolerated treatment well    Behavior During Therapy  [facility] for tasks assessed/performed       Past Medical History:  Diagnosis Date  . Acute cystitis without hematuria   . Acute diastolic CHF (congestive heart failure) ([place])   . Arthritis   . Dyspnea   . Dysrhythmia   . Fever of unknown origin 03/19/2017  . Hyperlipidemia   . Hypertension    denies at preop  . Multifocal pneumonia   . Neuromuscular disorder ([place])    neuropathy left arm and foot  . Osteopenia   . Paralysis ([place])    partial left side from CVA   . Persistent atrial fibrillation   . PONV (postoperative nausea and vomiting)   . Pre-diabetes   . Stroke [facility]) 2013   hemmorahgic    Past Surgical History:  Procedure Laterality Date  . ANKLE SURGERY    . APPENDECTOMY    . CHOLECYSTECTOMY    . HERNIA REPAIR     Esophagus  . JOINT REPLACEMENT     total- right partial- left  . MASTECTOMY PARTIAL / LUMPECTOMY  2012   left  . ORIF ANKLE FRACTURE Left 07/20/2018   Procedure: OPEN REDUCTION INTERNAL FIXATION (ORIF) ANKLE FRACTURE;  Surgeon: Wylene Simmer, MD;  Location: [place];  Service: Orthopedics;  Laterality: Left;  . TOTAL KNEE ARTHROPLASTY Left 01/27/2019   Procedure: TOTAL KNEE ARTHROPLASTY;  Surgeon: Vickey Huger, MD;  [facility];  Service: Orthopedics;  Laterality: Left;    There were no vitals filed for this visit.  Subjective Assessment - 04/16/19 1101    Subjective   Patient reports that she is doing okay feels like she is getting stronger    Currently in Pain?  Yes    Pain Score  3     Pain Location  Shoulder    Pain Relieving Factors  voltaren helps                       [facility] Adult PT Treatment/Exercise - 04/16/19 0001      Ambulation/Gait   Gait Comments  gait with walker CGA, with verbal cues, 21', 50' and 45'      Knee/Hip Exercises: Aerobic   Nustep  level 5 x 7 minutes      Knee/Hip Exercises: Seated   Other Seated Knee/Hip Exercises  ankle red tband exercises    Other Seated Knee/Hip Exercises  bosu behind her crunches, left wrist with 2# reaching and stacking cones to help with future use of walker    Marching  Strengthening;Left;2 sets;10 reps    Marching Weights  3 lbs.    Hamstring Curl  Strengthening;Both;2 sets;10 reps    Hamstring Limitations  green tband               PT Short Term Goals - 03/20/19 1140  PT SHORT TERM GOAL #1   Title  independent with initial HEP    Status  Achieved        PT Long Term Goals - 04/16/19 1153      PT LONG TERM GOAL #1   Title  walk with hand hold assist x 100 feet    Status  On-going            Plan - 04/16/19 1151    Clinical Impression Statement  Today with use of walker for gait the first time was 21 feet with small shuffling steps, at one point it was taking 4-5 steps to advance 1 foot, I did a lot of cues to move with the walker instead of walker, step/step, this allowed her to move forward faster and more efficiently.  She did have left hand pain from grasping walker so hard, did some STM to this area    PT Next Visit Plan  continue to work on gait    Consulted and Agree with Plan of Care  Patient       Patient will benefit from skilled therapeutic intervention in order to improve the following deficits and impairments:  Abnormal gait, Decreased coordination, Decreased range of motion, Difficulty walking, Cardiopulmonary status limiting  activity, Decreased endurance, Increased muscle spasms, Impaired UE functional use, Decreased activity tolerance, Pain, Decreased balance, Postural dysfunction, Decreased strength, Decreased mobility  Visit Diagnosis: 1. Difficulty in walking, not elsewhere classified   2. Muscle weakness (generalized)   3. Stiffness of left knee, not elsewhere classified   4. Late effects of CVA (cerebrovascular accident)   5. Localized swelling, mass and lump, left lower limb   6. Acute pain of left knee        Problem List Patient Active Problem List   Diagnosis Date Noted  . S/P total knee replacement 01/27/2019  . Closed left ankle fracture 07/20/2018  . Acute ankle pain 05/23/2018  . Respiratory failure with hypoxia ([place]) 08/30/2017  . UTI (urinary tract infection) 07/05/2017  . Hypoxemia   . Heart failure with preserved ejection fraction ([place]), Grade 3 diastolic dysfunction 73/71/0626  . Atrial fibrillation with RVR ([place])   . PAF (paroxysmal atrial fibrillation) ([place])   . Metabolic syndrome 94/85/4627  . Encounter for preventive health examination 02/17/2016  . Morbid obesity ([place]) 06/17/2015  . Hypomagnesemia 04/24/2014  . Hemiparesis affecting left side as late effect of cerebrovascular accident ([place]) 04/24/2014  . Nontraumatic cerebral hemorrhage ([place]) 04/30/2012  . DM (diabetes mellitus) with complications ([place]) 03/50/0938  . OSTEOPENIA 01/21/2009  . UNSPECIFIED VITAMIN D DEFICIENCY 11/19/2007  . ESSENTIAL HYPERTENSION, BENIGN 11/19/2007  . HYPERCHOLESTEROLEMIA 10/25/2006  . GASTROESOPHAGEAL REFLUX, NO ESOPHAGITIS 10/25/2006  . DIVERTICULOSIS OF COLON 10/25/2006  . Osteoarthritis 10/25/2006  . CERVICAL SPINE DISORDER, NOS 10/25/2006    Sumner Boast., PT 04/16/2019, 11:54 AM  [address] [place], [place], [address] Phone: [phone]   Fax:  [phone]  Name: Karen Jackson MRN: [id number] Date  of Birth: [birth year]

## 2019-04-18 ENCOUNTER — Other Ambulatory Visit: Payer: Self-pay

## 2019-04-18 ENCOUNTER — Ambulatory Visit: Payer: Medicare Other | Admitting: Physical Therapy

## 2019-04-18 ENCOUNTER — Encounter: Payer: Self-pay | Admitting: Physical Therapy

## 2019-04-18 DIAGNOSIS — R2242 Localized swelling, mass and lump, left lower limb: Secondary | ICD-10-CM

## 2019-04-18 DIAGNOSIS — I69354 Hemiplegia and hemiparesis following cerebral infarction affecting left non-dominant side: Secondary | ICD-10-CM

## 2019-04-18 DIAGNOSIS — M6281 Muscle weakness (generalized): Secondary | ICD-10-CM | POA: Diagnosis not present

## 2019-04-18 DIAGNOSIS — M25662 Stiffness of left knee, not elsewhere classified: Secondary | ICD-10-CM | POA: Diagnosis not present

## 2019-04-18 DIAGNOSIS — I6990 Unspecified sequelae of unspecified cerebrovascular disease: Secondary | ICD-10-CM

## 2019-04-18 DIAGNOSIS — M25572 Pain in left ankle and joints of left foot: Secondary | ICD-10-CM | POA: Diagnosis not present

## 2019-04-18 DIAGNOSIS — R262 Difficulty in walking, not elsewhere classified: Secondary | ICD-10-CM | POA: Diagnosis not present

## 2019-04-18 DIAGNOSIS — M25562 Pain in left knee: Secondary | ICD-10-CM

## 2019-04-18 NOTE — Therapy (Signed)
[place], [place], [address] Phone: [phone]   Fax:  [phone]  Physical Therapy Treatment  Patient Details  Name: Karen Jackson MRN: [id number] Date of Birth: 02/04/1941 Referring Provider (PT): Lucey   Encounter Date: 04/18/2019  PT End of Session - 04/18/19 [id number]    Visit Number  18    Date for PT Re-Evaluation  05/04/19    PT Start Time  1055    PT Stop Time  1142    PT Time Calculation (min)  47 min    Activity Tolerance  Patient tolerated treatment well    Behavior During Therapy  [facility] for tasks assessed/performed       Past Medical History:  Diagnosis Date  . Acute cystitis without hematuria   . Acute diastolic CHF (congestive heart failure) ([place])   . Arthritis   . Dyspnea   . Dysrhythmia   . Fever of unknown origin 03/19/2017  . Hyperlipidemia   . Hypertension    denies at preop  . Multifocal pneumonia   . Neuromuscular disorder ([place])    neuropathy left arm and foot  . Osteopenia   . Paralysis ([place])    partial left side from CVA   . Persistent atrial fibrillation   . PONV (postoperative nausea and vomiting)   . Pre-diabetes   . Stroke [facility]) 2013   hemmorahgic    Past Surgical History:  Procedure Laterality Date  . ANKLE SURGERY    . APPENDECTOMY    . CHOLECYSTECTOMY    . HERNIA REPAIR     Esophagus  . JOINT REPLACEMENT     total- right partial- left  . MASTECTOMY PARTIAL / LUMPECTOMY  2012   left  . ORIF ANKLE FRACTURE Left 07/20/2018   Procedure: OPEN REDUCTION INTERNAL FIXATION (ORIF) ANKLE FRACTURE;  Surgeon: Wylene Simmer, MD;  Location: [place];  Service: Orthopedics;  Laterality: Left;  . TOTAL KNEE ARTHROPLASTY Left 01/27/2019   Procedure: TOTAL KNEE ARTHROPLASTY;  Surgeon: Vickey Huger, MD;  [facility];  Service: Orthopedics;  Laterality: Left;    There were no vitals filed for this visit.  Subjective Assessment - 04/18/19 1106    Subjective   Patient reports that she is doing well    Currently in Pain?  No/denies                       [facility] Adult PT Treatment/Exercise - 04/18/19 0001      Ambulation/Gait   Gait Comments  gait with walker and verbal cues, SBA to CGA for walker advancement 50' x 2      Exercises   Exercises  Knee/Hip      Knee/Hip Exercises: Aerobic   Nustep  level 5 x 7 minutes      Knee/Hip Exercises: Standing   Other Standing Knee Exercises  toe touches on a half foam noodle with HHA, very difficult for her to stand on the left leg and pick up the right      Knee/Hip Exercises: Seated   Other Seated Knee/Hip Exercises  ankle red tband exercises    Other Seated Knee/Hip Exercises  bosu behind her crunches, left wrist with 2# reaching and stacking cones to help with future use of walker    Marching  Strengthening;Left;2 sets;10 reps    Marching Weights  3 lbs.    Hamstring Curl  Strengthening;Both;2 sets;10 reps    Hamstring Limitations  green  tband               PT Short Term Goals - 03/20/19 1140      PT SHORT TERM GOAL #1   Title  independent with initial HEP    Status  Achieved        PT Long Term Goals - 04/18/19 1200      PT LONG TERM GOAL #1   Title  walk with hand hold assist x 100 feet    Status  On-going            Plan - 04/18/19 1157    Clinical Impression Statement  Patient struggled with the toe touches due to inability to shift weight and bear weight on the left, during the gait today at first on both attempts she really was fighting herselg, bottom and center of gravity way back and really fighting with her arms to stand but not using the correct mms, with a lot of verbal cues she will get straight and start to walk more normally, cues are to get over her legs and not fight, this is difficult for her    PT Next Visit Plan  continue to work on gait, in future may need to go to her house to work on real situational gait and safety    Consulted and  Agree with Plan of Care  Patient       Patient will benefit from skilled therapeutic intervention in order to improve the following deficits and impairments:  Abnormal gait, Decreased coordination, Decreased range of motion, Difficulty walking, Cardiopulmonary status limiting activity, Decreased endurance, Increased muscle spasms, Impaired UE functional use, Decreased activity tolerance, Pain, Decreased balance, Postural dysfunction, Decreased strength, Decreased mobility  Visit Diagnosis: Difficulty in walking, not elsewhere classified  Muscle weakness (generalized)  Stiffness of left knee, not elsewhere classified  Late effects of CVA (cerebrovascular accident)  Localized swelling, mass and lump, left lower limb  Acute pain of left knee  Hemiplegia and hemiparesis following cerebral infarction affecting left non-dominant side [facility])     Problem List Patient Active Problem List   Diagnosis Date Noted  . S/P total knee replacement 01/27/2019  . Closed left ankle fracture 07/20/2018  . Acute ankle pain 05/23/2018  . Respiratory failure with hypoxia ([place]) 08/30/2017  . UTI (urinary tract infection) 07/05/2017  . Hypoxemia   . Heart failure with preserved ejection fraction ([place]), Grade 3 diastolic dysfunction [id number]  . Atrial fibrillation with RVR ([place])   . PAF (paroxysmal atrial fibrillation) ([place])   . Metabolic syndrome 123456  . Encounter for preventive health examination 02/17/2016  . Morbid obesity ([place]) 06/17/2015  . Hypomagnesemia 04/24/2014  . Hemiparesis affecting left side as late effect of cerebrovascular accident ([place]) 04/24/2014  . Nontraumatic cerebral hemorrhage ([place]) 04/30/2012  . DM (diabetes mellitus) with complications ([place]) 0000000  . OSTEOPENIA 01/21/2009  . UNSPECIFIED VITAMIN D DEFICIENCY 11/19/2007  . ESSENTIAL HYPERTENSION, BENIGN 11/19/2007  . HYPERCHOLESTEROLEMIA 10/25/2006  . GASTROESOPHAGEAL REFLUX, NO ESOPHAGITIS 10/25/2006  .  DIVERTICULOSIS OF COLON 10/25/2006  . Osteoarthritis 10/25/2006  . CERVICAL SPINE DISORDER, NOS 10/25/2006    Sumner Boast., PT 04/18/2019, 12:00 PM  [address] [place], [place], [address] Phone: [phone]   Fax:  [phone]  Name: GITTY BRASE MRN: [id number] Date of Birth: 1941-06-25

## 2019-04-21 ENCOUNTER — Other Ambulatory Visit: Payer: Self-pay

## 2019-04-21 ENCOUNTER — Ambulatory Visit: Payer: Medicare Other | Admitting: Physical Therapy

## 2019-04-21 ENCOUNTER — Encounter: Payer: Self-pay | Admitting: Physical Therapy

## 2019-04-21 DIAGNOSIS — I6990 Unspecified sequelae of unspecified cerebrovascular disease: Secondary | ICD-10-CM | POA: Diagnosis not present

## 2019-04-21 DIAGNOSIS — M25572 Pain in left ankle and joints of left foot: Secondary | ICD-10-CM | POA: Diagnosis not present

## 2019-04-21 DIAGNOSIS — R262 Difficulty in walking, not elsewhere classified: Secondary | ICD-10-CM

## 2019-04-21 DIAGNOSIS — M25662 Stiffness of left knee, not elsewhere classified: Secondary | ICD-10-CM | POA: Diagnosis not present

## 2019-04-21 DIAGNOSIS — M6281 Muscle weakness (generalized): Secondary | ICD-10-CM | POA: Diagnosis not present

## 2019-04-21 DIAGNOSIS — I69354 Hemiplegia and hemiparesis following cerebral infarction affecting left non-dominant side: Secondary | ICD-10-CM

## 2019-04-21 DIAGNOSIS — R2242 Localized swelling, mass and lump, left lower limb: Secondary | ICD-10-CM | POA: Diagnosis not present

## 2019-04-21 DIAGNOSIS — M25562 Pain in left knee: Secondary | ICD-10-CM

## 2019-04-21 NOTE — Therapy (Signed)
[place], [place], [address] Phone: [phone]   Fax:  [phone]  Physical Therapy Treatment  Patient Details  Name: Karen Jackson MRN: [id number] Date of Birth: 10/25/1940 Referring Provider (PT): Lucey   Encounter Date: 04/21/2019  PT End of Session - 04/21/19 1149    Visit Number  19    Date for PT Re-Evaluation  05/04/19    PT Start Time  1055    PT Stop Time  1141    PT Time Calculation (min)  46 min    Activity Tolerance  Patient tolerated treatment well    Behavior During Therapy  [facility] for tasks assessed/performed       Past Medical History:  Diagnosis Date  . Acute cystitis without hematuria   . Acute diastolic CHF (congestive heart failure) ([place])   . Arthritis   . Dyspnea   . Dysrhythmia   . Fever of unknown origin 03/19/2017  . Hyperlipidemia   . Hypertension    denies at preop  . Multifocal pneumonia   . Neuromuscular disorder ([place])    neuropathy left arm and foot  . Osteopenia   . Paralysis ([place])    partial left side from CVA   . Persistent atrial fibrillation   . PONV (postoperative nausea and vomiting)   . Pre-diabetes   . Stroke [facility]) 2013   hemmorahgic    Past Surgical History:  Procedure Laterality Date  . ANKLE SURGERY    . APPENDECTOMY    . CHOLECYSTECTOMY    . HERNIA REPAIR     Esophagus  . JOINT REPLACEMENT     total- right partial- left  . MASTECTOMY PARTIAL / LUMPECTOMY  2012   left  . ORIF ANKLE FRACTURE Left 07/20/2018   Procedure: OPEN REDUCTION INTERNAL FIXATION (ORIF) ANKLE FRACTURE;  Surgeon: Wylene Simmer, MD;  Location: [place];  Service: Orthopedics;  Laterality: Left;  . TOTAL KNEE ARTHROPLASTY Left 01/27/2019   Procedure: TOTAL KNEE ARTHROPLASTY;  Surgeon: Vickey Huger, MD;  [facility];  Service: Orthopedics;  Laterality: Left;    There were no vitals filed for this visit.  Subjective Assessment - 04/21/19 1103    Subjective   Patient reports that over the weekend she used a toilet that she was not used to using and had her knee up under her, she reports that the knee has been pretty sore since    Currently in Pain?  Yes    Pain Score  6     Pain Location  Knee    Pain Orientation  Left                       [facility] Adult PT Treatment/Exercise - 04/21/19 0001      Ambulation/Gait   Gait Comments  gait with walker x 50 feet mostly on tile and then transistion to carpet, the carpet was more difficult.  Gait 75' with walker and on carpet, then gait with HHA 2x50'      Knee/Hip Exercises: Stretches   Gastroc Stretch  3 reps;20 seconds;Left      Knee/Hip Exercises: Aerobic   Nustep  level 6 x 7 minutes      Knee/Hip Exercises: Seated   Long Arc Quad  Strengthening;Left;10 reps;3 sets    [employer] Weight  5 lbs.    Other Seated Knee/Hip Exercises  ankle red tband exercises    Marching  Strengthening;Left;2 sets;10  reps    Marching Weights  3 lbs.    Hamstring Curl  Strengthening;Both;2 sets;10 reps    Hamstring Limitations  green tband               PT Short Term Goals - 03/20/19 1140      PT SHORT TERM GOAL #1   Title  independent with initial HEP    Status  Achieved        PT Long Term Goals - 04/21/19 1152      PT LONG TERM GOAL #1   Title  walk with hand hold assist x 100 feet    Status  On-going            Plan - 04/21/19 1149    Clinical Impression Statement  Patient with a little more left knee pain today after bending it too much on a new toilet.  She had trouble transitioning from tile to carpet walking today, needind cues to pick up feet as she would catch the foot ont he carpet, biggest issue is trying to make her safe and functionally independent at times when alone at home    PT Next Visit Plan  continue to work on gait, in future may need to go to her house to work on real situational gait and safety    Consulted and Agree with Plan of Care  Patient        Patient will benefit from skilled therapeutic intervention in order to improve the following deficits and impairments:  Abnormal gait, Decreased coordination, Decreased range of motion, Difficulty walking, Cardiopulmonary status limiting activity, Decreased endurance, Increased muscle spasms, Impaired UE functional use, Decreased activity tolerance, Pain, Decreased balance, Postural dysfunction, Decreased strength, Decreased mobility  Visit Diagnosis: Difficulty in walking, not elsewhere classified  Muscle weakness (generalized)  Stiffness of left knee, not elsewhere classified  Late effects of CVA (cerebrovascular accident)  Localized swelling, mass and lump, left lower limb  Acute pain of left knee  Hemiplegia and hemiparesis following cerebral infarction affecting left non-dominant side [facility])     Problem List Patient Active Problem List   Diagnosis Date Noted  . S/P total knee replacement 01/27/2019  . Closed left ankle fracture 07/20/2018  . Acute ankle pain 05/23/2018  . Respiratory failure with hypoxia ([place]) 08/30/2017  . UTI (urinary tract infection) 07/05/2017  . Hypoxemia   . Heart failure with preserved ejection fraction ([place]), Grade 3 diastolic dysfunction [id number]  . Atrial fibrillation with RVR ([place])   . PAF (paroxysmal atrial fibrillation) ([place])   . Metabolic syndrome 123456  . Encounter for preventive health examination 02/17/2016  . Morbid obesity ([place]) 06/17/2015  . Hypomagnesemia 04/24/2014  . Hemiparesis affecting left side as late effect of cerebrovascular accident ([place]) 04/24/2014  . Nontraumatic cerebral hemorrhage ([place]) 04/30/2012  . DM (diabetes mellitus) with complications ([place]) 0000000  . OSTEOPENIA 01/21/2009  . UNSPECIFIED VITAMIN D DEFICIENCY 11/19/2007  . ESSENTIAL HYPERTENSION, BENIGN 11/19/2007  . HYPERCHOLESTEROLEMIA 10/25/2006  . GASTROESOPHAGEAL REFLUX, NO ESOPHAGITIS 10/25/2006  . DIVERTICULOSIS OF COLON 10/25/2006  .  Osteoarthritis 10/25/2006  . CERVICAL SPINE DISORDER, NOS 10/25/2006    Sumner Boast., PT 04/21/2019, 11:53 AM  [place] [address] [place], [place], [address] Phone: [phone]   Fax:  [phone]  Name: Karen Jackson MRN: [id number] Date of Birth: 1941-02-08

## 2019-04-23 ENCOUNTER — Ambulatory Visit: Payer: Medicare Other | Admitting: Physical Therapy

## 2019-04-23 ENCOUNTER — Encounter: Payer: Self-pay | Admitting: Physical Therapy

## 2019-04-23 ENCOUNTER — Other Ambulatory Visit: Payer: Self-pay

## 2019-04-23 DIAGNOSIS — R2242 Localized swelling, mass and lump, left lower limb: Secondary | ICD-10-CM | POA: Diagnosis not present

## 2019-04-23 DIAGNOSIS — R262 Difficulty in walking, not elsewhere classified: Secondary | ICD-10-CM

## 2019-04-23 DIAGNOSIS — M6281 Muscle weakness (generalized): Secondary | ICD-10-CM | POA: Diagnosis not present

## 2019-04-23 DIAGNOSIS — M25662 Stiffness of left knee, not elsewhere classified: Secondary | ICD-10-CM | POA: Diagnosis not present

## 2019-04-23 DIAGNOSIS — M25572 Pain in left ankle and joints of left foot: Secondary | ICD-10-CM | POA: Diagnosis not present

## 2019-04-23 DIAGNOSIS — I6990 Unspecified sequelae of unspecified cerebrovascular disease: Secondary | ICD-10-CM | POA: Diagnosis not present

## 2019-04-23 DIAGNOSIS — I69354 Hemiplegia and hemiparesis following cerebral infarction affecting left non-dominant side: Secondary | ICD-10-CM

## 2019-04-23 DIAGNOSIS — M25562 Pain in left knee: Secondary | ICD-10-CM

## 2019-04-23 NOTE — Therapy (Signed)
[place], [place], [address] Phone: [phone]   Fax:  [phone] Progress Note Reporting Period 03/31/19 to 04/23/19 for visits 11-20  See note below for Objective Data and Assessment of Progress/Goals.      Physical Therapy Treatment  Patient Details  Name: Karen Jackson MRN: [id number] Date of Birth: 24-Sep-1940 Referring Provider (PT): Lucey   Encounter Date: 04/23/2019  PT End of Session - 04/23/19 1146    Visit Number  20    Date for PT Re-Evaluation  05/04/19    PT Start Time  1055    PT Stop Time  1139    PT Time Calculation (min)  44 min    Activity Tolerance  Patient tolerated treatment well    Behavior During Therapy  [facility] for tasks assessed/performed       Past Medical History:  Diagnosis Date  . Acute cystitis without hematuria   . Acute diastolic CHF (congestive heart failure) ([place])   . Arthritis   . Dyspnea   . Dysrhythmia   . Fever of unknown origin 03/19/2017  . Hyperlipidemia   . Hypertension    denies at preop  . Multifocal pneumonia   . Neuromuscular disorder ([place])    neuropathy left arm and foot  . Osteopenia   . Paralysis ([place])    partial left side from CVA   . Persistent atrial fibrillation   . PONV (postoperative nausea and vomiting)   . Pre-diabetes   . Stroke [facility]) 2013   hemmorahgic    Past Surgical History:  Procedure Laterality Date  . ANKLE SURGERY    . APPENDECTOMY    . CHOLECYSTECTOMY    . HERNIA REPAIR     Esophagus  . JOINT REPLACEMENT     total- right partial- left  . MASTECTOMY PARTIAL / LUMPECTOMY  2012   left  . ORIF ANKLE FRACTURE Left 07/20/2018   Procedure: OPEN REDUCTION INTERNAL FIXATION (ORIF) ANKLE FRACTURE;  Surgeon: Wylene Simmer, MD;  Location: [place];  Service: Orthopedics;  Laterality: Left;  . TOTAL KNEE ARTHROPLASTY Left 01/27/2019   Procedure: TOTAL KNEE ARTHROPLASTY;  Surgeon: Vickey Huger, MD;  [facility];   Service: Orthopedics;  Laterality: Left;    There were no vitals filed for this visit.  Subjective Assessment - 04/23/19 1106    Subjective  Patient did well last vist, she reports that she was tired, she did get in a normal chair at home this week.    Currently in Pain?  Yes    Pain Score  5     Pain Location  Knee                       [facility] Adult PT Treatment/Exercise - 04/23/19 0001      Ambulation/Gait   Gait Comments  gait with walker CGA and chair following 2x 40 feet, then HHA x 60 feet, very tirewd with this and needed encouragement      Knee/Hip Exercises: Aerobic   Nustep  level 6 x 7 minutes      Knee/Hip Exercises: Seated   Long Arc Quad  Strengthening;Left;10 reps;3 sets    [employer] Weight  5 lbs.    Ball Squeeze  25    Other Seated Knee/Hip Exercises  ankle green tband exercises    Other Seated Knee/Hip Exercises  bosu behind her crunches, left wrist with 2# reaching and stacking cones  to help with future use of walker    Marching  Strengthening;Left;2 sets;10 reps    Marching Weights  3 lbs.    Hamstring Curl  Strengthening;Both;2 sets;10 reps    Hamstring Limitations  green tband      Manual Therapy   Manual Therapy  Passive ROM    Passive ROM  motion of the knee and then with pressure through the ankle for eversion to help with not rolling ankle               PT Short Term Goals - 03/20/19 1140      PT SHORT TERM GOAL #1   Title  independent with initial HEP    Status  Achieved        PT Long Term Goals - 04/23/19 1149      PT LONG TERM GOAL #1   Title  walk with hand hold assist x 100 feet    Status  Partially Met      PT LONG TERM GOAL #3   Title  walk 100 feet with SBA    Status  On-going            Plan - 04/23/19 1147    Clinical Impression Statement  Patient transitioned from tile to carpet much better today with some cues. she did catch the toe once but after that did well.  She continues to be a  little impulsive on her transfers, sitting and twisting the knee prior to being all the way turned, requiring cues to do better.    PT Next Visit Plan  gait and strength    Consulted and Agree with Plan of Care  Patient       Patient will benefit from skilled therapeutic intervention in order to improve the following deficits and impairments:  Abnormal gait, Decreased coordination, Decreased range of motion, Difficulty walking, Cardiopulmonary status limiting activity, Decreased endurance, Increased muscle spasms, Impaired UE functional use, Decreased activity tolerance, Pain, Decreased balance, Postural dysfunction, Decreased strength, Decreased mobility  Visit Diagnosis: Difficulty in walking, not elsewhere classified  Muscle weakness (generalized)  Stiffness of left knee, not elsewhere classified  Late effects of CVA (cerebrovascular accident)  Localized swelling, mass and lump, left lower limb  Acute pain of left knee  Hemiplegia and hemiparesis following cerebral infarction affecting left non-dominant side [facility])     Problem List Patient Active Problem List   Diagnosis Date Noted  . S/P total knee replacement 01/27/2019  . Closed left ankle fracture 07/20/2018  . Acute ankle pain 05/23/2018  . Respiratory failure with hypoxia ([place]) 08/30/2017  . UTI (urinary tract infection) 07/05/2017  . Hypoxemia   . Heart failure with preserved ejection fraction ([place]), Grade 3 diastolic dysfunction 89/16/9450  . Atrial fibrillation with RVR ([place])   . PAF (paroxysmal atrial fibrillation) ([place])   . Metabolic syndrome 38/88/2800  . Encounter for preventive health examination 02/17/2016  . Morbid obesity ([place]) 06/17/2015  . Hypomagnesemia 04/24/2014  . Hemiparesis affecting left side as late effect of cerebrovascular accident ([place]) 04/24/2014  . Nontraumatic cerebral hemorrhage ([place]) 04/30/2012  . DM (diabetes mellitus) with complications ([place]) 34/91/7915  . OSTEOPENIA 01/21/2009  .  UNSPECIFIED VITAMIN D DEFICIENCY 11/19/2007  . ESSENTIAL HYPERTENSION, BENIGN 11/19/2007  . HYPERCHOLESTEROLEMIA 10/25/2006  . GASTROESOPHAGEAL REFLUX, NO ESOPHAGITIS 10/25/2006  . DIVERTICULOSIS OF COLON 10/25/2006  . Osteoarthritis 10/25/2006  . CERVICAL SPINE DISORDER, NOS 10/25/2006    Sumner Boast., PT 04/23/2019, 11:52 AM  [place] [address] [place], [place], [address] Phone: [phone]   Fax:  [phone]  Name: Karen Jackson MRN: [id number] Date of Birth: [birth year]

## 2019-04-25 ENCOUNTER — Encounter: Payer: Self-pay | Admitting: Physical Therapy

## 2019-04-25 ENCOUNTER — Other Ambulatory Visit: Payer: Self-pay

## 2019-04-25 ENCOUNTER — Ambulatory Visit: Payer: Medicare Other | Admitting: Physical Therapy

## 2019-04-25 DIAGNOSIS — R262 Difficulty in walking, not elsewhere classified: Secondary | ICD-10-CM

## 2019-04-25 DIAGNOSIS — R2242 Localized swelling, mass and lump, left lower limb: Secondary | ICD-10-CM

## 2019-04-25 DIAGNOSIS — M25572 Pain in left ankle and joints of left foot: Secondary | ICD-10-CM | POA: Diagnosis not present

## 2019-04-25 DIAGNOSIS — M25562 Pain in left knee: Secondary | ICD-10-CM

## 2019-04-25 DIAGNOSIS — M6281 Muscle weakness (generalized): Secondary | ICD-10-CM

## 2019-04-25 DIAGNOSIS — I69354 Hemiplegia and hemiparesis following cerebral infarction affecting left non-dominant side: Secondary | ICD-10-CM

## 2019-04-25 DIAGNOSIS — I6990 Unspecified sequelae of unspecified cerebrovascular disease: Secondary | ICD-10-CM

## 2019-04-25 DIAGNOSIS — M25662 Stiffness of left knee, not elsewhere classified: Secondary | ICD-10-CM | POA: Diagnosis not present

## 2019-04-25 NOTE — Therapy (Signed)
Karen Jackson, [place], [address] Phone: [phone]   Fax:  [phone]  Physical Therapy Treatment  Patient Details  Name: Karen Jackson MRN: [id number] Date of Birth: 1941-02-26 Referring Provider (PT): Lucey   Encounter Date: 04/25/2019  PT End of Session - 04/25/19 1146    Visit Number  21    Date for PT Re-Evaluation  05/04/19    PT Start Time  1055    PT Stop Time  1143    PT Time Calculation (min)  48 min    Activity Tolerance  Patient tolerated treatment well    Behavior During Therapy  [facility] for tasks assessed/performed       Past Medical History:  Diagnosis Date  . Acute cystitis without hematuria   . Acute diastolic CHF (congestive heart failure) ([place])   . Arthritis   . Dyspnea   . Dysrhythmia   . Fever of unknown origin 03/19/2017  . Hyperlipidemia   . Hypertension    denies at preop  . Multifocal pneumonia   . Neuromuscular disorder ([place])    neuropathy left arm and foot  . Osteopenia   . Paralysis ([place])    partial left side from CVA   . Persistent atrial fibrillation   . PONV (postoperative nausea and vomiting)   . Pre-diabetes   . Stroke [facility]) 2013   hemmorahgic    Past Surgical History:  Procedure Laterality Date  . ANKLE SURGERY    . APPENDECTOMY    . CHOLECYSTECTOMY    . HERNIA REPAIR     Esophagus  . JOINT REPLACEMENT     total- right partial- left  . MASTECTOMY PARTIAL / LUMPECTOMY  2012   left  . ORIF ANKLE FRACTURE Left 07/20/2018   Procedure: OPEN REDUCTION INTERNAL FIXATION (ORIF) ANKLE FRACTURE;  Surgeon: Wylene Simmer, MD;  Location: [place];  Service: Orthopedics;  Laterality: Left;  . TOTAL KNEE ARTHROPLASTY Left 01/27/2019   Procedure: TOTAL KNEE ARTHROPLASTY;  Surgeon: Vickey Huger, MD;  [facility];  Service: Orthopedics;  Laterality: Left;    There were no vitals filed for this visit.  Subjective Assessment - 04/25/19 1145    Subjective   Patient reports that at home she is sitting in a normal chair and using a normal commode, doe report some pain in the medial knee    Currently in Pain?  Yes    Pain Score  4     Pain Location  Knee    Pain Orientation  Left;Medial                       [facility] Adult PT Treatment/Exercise - 04/25/19 0001      Ambulation/Gait   Gait Comments  gait with walker 2 x 66', 1x with HHA x 50', the first 5-6 feet really take her a while to get going, side stepping      Knee/Hip Exercises: Seated   Long Arc Quad  Strengthening;Left;10 reps;3 sets    [employer] Weight  5 lbs.    Long [employer] Limitations  cues for [employer]  25    Hamstring Curl  Strengthening;Both;2 sets;10 reps    Hamstring Limitations  green tband               PT Short Term Goals - 03/20/19 1140      PT SHORT TERM GOAL #1  Title  independent with initial HEP    Status  Achieved        PT Long Term Goals - 04/25/19 1149      PT LONG TERM GOAL #1   Title  walk with hand hold assist x 100 feet    Status  Partially Met            Plan - 04/25/19 1149    Clinical Impression Statement  Patietn really struggles with getting up from sitting and then the first 5-6 feet are difficult, very slow, difficulty getting moving and shuffling steps, then she seems to get going with cues to get on top of the legs and she starts to roll with it.  She still does pretty poor transfers at times which is why I think the medial knee hurts she twists the knee by sitting early    PT Next Visit Plan  functional activities    Consulted and Agree with Plan of Care  Patient       Patient will benefit from skilled therapeutic intervention in order to improve the following deficits and impairments:  Abnormal gait, Decreased coordination, Decreased range of motion, Difficulty walking, Cardiopulmonary status limiting activity, Decreased endurance, Increased muscle spasms, Impaired UE functional use,  Decreased activity tolerance, Pain, Decreased balance, Postural dysfunction, Decreased strength, Decreased mobility  Visit Diagnosis: Difficulty in walking, not elsewhere classified  Muscle weakness (generalized)  Stiffness of left knee, not elsewhere classified  Late effects of CVA (cerebrovascular accident)  Localized swelling, mass and lump, left lower limb  Acute pain of left knee  Hemiplegia and hemiparesis following cerebral infarction affecting left non-dominant side [facility])     Problem List Patient Active Problem List   Diagnosis Date Noted  . S/P total knee replacement 01/27/2019  . Closed left ankle fracture 07/20/2018  . Acute ankle pain 05/23/2018  . Respiratory failure with hypoxia ([place]) 08/30/2017  . UTI (urinary tract infection) 07/05/2017  . Hypoxemia   . Heart failure with preserved ejection fraction ([place]), Grade 3 diastolic dysfunction 52/84/1324  . Atrial fibrillation with RVR ([place])   . PAF (paroxysmal atrial fibrillation) ([place])   . Metabolic syndrome 40/05/2724  . Encounter for preventive health examination 02/17/2016  . Morbid obesity ([place]) 06/17/2015  . Hypomagnesemia 04/24/2014  . Hemiparesis affecting left side as late effect of cerebrovascular accident ([place]) 04/24/2014  . Nontraumatic cerebral hemorrhage ([place]) 04/30/2012  . DM (diabetes mellitus) with complications ([place]) 36/64/4034  . OSTEOPENIA 01/21/2009  . UNSPECIFIED VITAMIN D DEFICIENCY 11/19/2007  . ESSENTIAL HYPERTENSION, BENIGN 11/19/2007  . HYPERCHOLESTEROLEMIA 10/25/2006  . GASTROESOPHAGEAL REFLUX, NO ESOPHAGITIS 10/25/2006  . DIVERTICULOSIS OF COLON 10/25/2006  . Osteoarthritis 10/25/2006  . CERVICAL SPINE DISORDER, NOS 10/25/2006    Sumner Boast., PT 04/25/2019, 11:52 AM  [place] [address] [place], [place], [address] Phone: [phone]   Fax:  [phone]  Name: Karen Jackson MRN: [id number] Date of  Birth: [birth year]

## 2019-04-28 ENCOUNTER — Ambulatory Visit: Payer: Medicare Other | Admitting: Physical Therapy

## 2019-04-28 ENCOUNTER — Encounter: Payer: Self-pay | Admitting: Physical Therapy

## 2019-04-28 ENCOUNTER — Other Ambulatory Visit: Payer: Self-pay

## 2019-04-28 DIAGNOSIS — M6281 Muscle weakness (generalized): Secondary | ICD-10-CM | POA: Diagnosis not present

## 2019-04-28 DIAGNOSIS — M25572 Pain in left ankle and joints of left foot: Secondary | ICD-10-CM | POA: Diagnosis not present

## 2019-04-28 DIAGNOSIS — M25662 Stiffness of left knee, not elsewhere classified: Secondary | ICD-10-CM

## 2019-04-28 DIAGNOSIS — R262 Difficulty in walking, not elsewhere classified: Secondary | ICD-10-CM

## 2019-04-28 DIAGNOSIS — I69354 Hemiplegia and hemiparesis following cerebral infarction affecting left non-dominant side: Secondary | ICD-10-CM

## 2019-04-28 DIAGNOSIS — R2242 Localized swelling, mass and lump, left lower limb: Secondary | ICD-10-CM

## 2019-04-28 DIAGNOSIS — I6990 Unspecified sequelae of unspecified cerebrovascular disease: Secondary | ICD-10-CM

## 2019-04-28 DIAGNOSIS — M25562 Pain in left knee: Secondary | ICD-10-CM

## 2019-04-28 NOTE — Therapy (Signed)
[place], [place], [address] Phone: [phone]   Fax:  [phone]  Physical Therapy Treatment  Patient Details  Name: Karen Jackson MRN: [id number] Date of Birth: [birth year] Referring Provider (PT): Lucey   Encounter Date: 04/28/2019  PT End of Session - 04/28/19 1143    Visit Number  22    Date for PT Re-Evaluation  05/04/19    PT Start Time  1055    PT Stop Time  1140    PT Time Calculation (min)  45 min    Activity Tolerance  Patient limited by fatigue    Behavior During Therapy  [facility] for tasks assessed/performed       Past Medical History:  Diagnosis Date  . Acute cystitis without hematuria   . Acute diastolic CHF (congestive heart failure) ([place])   . Arthritis   . Dyspnea   . Dysrhythmia   . Fever of unknown origin 03/19/2017  . Hyperlipidemia   . Hypertension    denies at preop  . Multifocal pneumonia   . Neuromuscular disorder ([place])    neuropathy left arm and foot  . Osteopenia   . Paralysis ([place])    partial left side from CVA   . Persistent atrial fibrillation   . PONV (postoperative nausea and vomiting)   . Pre-diabetes   . Stroke [facility]) 2013   hemmorahgic    Past Surgical History:  Procedure Laterality Date  . ANKLE SURGERY    . APPENDECTOMY    . CHOLECYSTECTOMY    . HERNIA REPAIR     Esophagus  . JOINT REPLACEMENT     total- right partial- left  . MASTECTOMY PARTIAL / LUMPECTOMY  2012   left  . ORIF ANKLE FRACTURE Left 07/20/2018   Procedure: OPEN REDUCTION INTERNAL FIXATION (ORIF) ANKLE FRACTURE;  Surgeon: Wylene Simmer, MD;  Location: [place];  Service: Orthopedics;  Laterality: Left;  . TOTAL KNEE ARTHROPLASTY Left 01/27/2019   Procedure: TOTAL KNEE ARTHROPLASTY;  Surgeon: Vickey Huger, MD;  [facility];  Service: Orthopedics;  Laterality: Left;    There were no vitals filed for this visit.  Subjective Assessment - 04/28/19 1107    Subjective  Patient  has no new reports today except she is aching all over due to the rainy weather    Currently in Pain?  Yes    Pain Score  5     Pain Location  Knee    Pain Orientation  Left    Aggravating Factors   rainy weather                       [facility] Adult PT Treatment/Exercise - 04/28/19 0001      Ambulation/Gait   Gait Comments  gait with walker, she really struggled today, only a few steps really needed a lot of cues to stand up and try to advance the walker.      High Level Balance   High Level Balance Activities  Side stepping    High Level Balance Comments  tried some single ;leg standing using her walker      Knee/Hip Exercises: Aerobic   Nustep  level 6 x 7 minutes      Knee/Hip Exercises: Seated   Other Seated Knee/Hip Exercises  ankle green tband exercises    Hamstring Curl  Strengthening;Both;2 sets;10 reps    Hamstring Limitations  green tband      Manual Therapy  Manual Therapy  Passive ROM    Manual therapy comments  PROM of the left shoulder, approximation of the fingers with motions to stimulate increased function, scar mobilization, ankle ROM and approximation    Passive ROM  motion of the knee and then with pressure through the ankle for eversion to help with not rolling ankle               PT Short Term Goals - 03/20/19 1140      PT SHORT TERM GOAL #1   Title  independent with initial HEP    Status  Achieved        PT Long Term Goals - 04/25/19 1149      PT LONG TERM GOAL #1   Title  walk with hand hold assist x 100 feet    Status  Partially Met            Plan - 04/28/19 1144    Clinical Impression Statement  Patient really had a hard time with any weight bearing and trying to walk today, she c/o left ankle pain, she had difficulty walking 20 feet with HHA and then was really unable to advance the walker to walk today with the walker,  I have seen her regress some over the weekend, but this was much worse and she blamed it on  the rain.  Reporting that she hurt all over.    PT Next Visit Plan  functional activities    Consulted and Agree with Plan of Care  Patient       Patient will benefit from skilled therapeutic intervention in order to improve the following deficits and impairments:  Abnormal gait, Decreased coordination, Decreased range of motion, Difficulty walking, Cardiopulmonary status limiting activity, Decreased endurance, Increased muscle spasms, Impaired UE functional use, Decreased activity tolerance, Pain, Decreased balance, Postural dysfunction, Decreased strength, Decreased mobility  Visit Diagnosis: Difficulty in walking, not elsewhere classified  Muscle weakness (generalized)  Stiffness of left knee, not elsewhere classified  Late effects of CVA (cerebrovascular accident)  Localized swelling, mass and lump, left lower limb  Acute pain of left knee  Hemiplegia and hemiparesis following cerebral infarction affecting left non-dominant side (HCC)  Pain in left ankle and joints of left foot     Problem List Patient Active Problem List   Diagnosis Date Noted  . S/P total knee replacement 01/27/2019  . Closed left ankle fracture 07/20/2018  . Acute ankle pain 05/23/2018  . Respiratory failure with hypoxia ([place]) 08/30/2017  . UTI (urinary tract infection) 07/05/2017  . Hypoxemia   . Heart failure with preserved ejection fraction ([place]), Grade 3 diastolic dysfunction 61/44/3154  . Atrial fibrillation with RVR ([place])   . PAF (paroxysmal atrial fibrillation) ([place])   . Metabolic syndrome 00/86/7619  . Encounter for preventive health examination 02/17/2016  . Morbid obesity ([place]) 06/17/2015  . Hypomagnesemia 04/24/2014  . Hemiparesis affecting left side as late effect of cerebrovascular accident ([place]) 04/24/2014  . Nontraumatic cerebral hemorrhage ([place]) 04/30/2012  . DM (diabetes mellitus) with complications ([place]) 50/93/2671  . OSTEOPENIA 01/21/2009  . UNSPECIFIED VITAMIN D DEFICIENCY  11/19/2007  . ESSENTIAL HYPERTENSION, BENIGN 11/19/2007  . HYPERCHOLESTEROLEMIA 10/25/2006  . GASTROESOPHAGEAL REFLUX, NO ESOPHAGITIS 10/25/2006  . DIVERTICULOSIS OF COLON 10/25/2006  . Osteoarthritis 10/25/2006  . CERVICAL SPINE DISORDER, NOS 10/25/2006    Sumner Boast., PT 04/28/2019, 11:48 AM  [address] [address] [place], [place], [address] Phone: [phone]  Fax:  [phone]  Name: ANADIA HELMES MRN: [id number] Date of Birth: 02-Dec-1940

## 2019-04-30 ENCOUNTER — Other Ambulatory Visit: Payer: Self-pay

## 2019-04-30 ENCOUNTER — Ambulatory Visit: Payer: Medicare Other | Attending: Orthopedic Surgery | Admitting: Physical Therapy

## 2019-04-30 ENCOUNTER — Encounter: Payer: Self-pay | Admitting: Physical Therapy

## 2019-04-30 ENCOUNTER — Other Ambulatory Visit: Payer: Self-pay | Admitting: Family Medicine

## 2019-04-30 DIAGNOSIS — M6281 Muscle weakness (generalized): Secondary | ICD-10-CM

## 2019-04-30 DIAGNOSIS — M25662 Stiffness of left knee, not elsewhere classified: Secondary | ICD-10-CM | POA: Diagnosis not present

## 2019-04-30 DIAGNOSIS — M25562 Pain in left knee: Secondary | ICD-10-CM | POA: Insufficient documentation

## 2019-04-30 DIAGNOSIS — R2242 Localized swelling, mass and lump, left lower limb: Secondary | ICD-10-CM | POA: Insufficient documentation

## 2019-04-30 DIAGNOSIS — R262 Difficulty in walking, not elsewhere classified: Secondary | ICD-10-CM | POA: Diagnosis not present

## 2019-04-30 DIAGNOSIS — I6990 Unspecified sequelae of unspecified cerebrovascular disease: Secondary | ICD-10-CM | POA: Insufficient documentation

## 2019-04-30 DIAGNOSIS — M25572 Pain in left ankle and joints of left foot: Secondary | ICD-10-CM | POA: Diagnosis not present

## 2019-04-30 DIAGNOSIS — I69354 Hemiplegia and hemiparesis following cerebral infarction affecting left non-dominant side: Secondary | ICD-10-CM | POA: Insufficient documentation

## 2019-04-30 NOTE — Therapy (Signed)
[place], [place], [address] Phone: [phone]   Fax:  [phone]  Physical Therapy Treatment  Patient Details  Name: Karen Jackson MRN: [id number] Date of Birth: 02/09/1941 Referring Provider (PT): Lucey   Encounter Date: 04/30/2019  PT End of Session - 04/30/19 1146    Visit Number  23    Date for PT Re-Evaluation  05/04/19    PT Start Time  1040    PT Stop Time  1130    PT Time Calculation (min)  50 min    Activity Tolerance  Patient tolerated treatment well    Behavior During Therapy  [facility] for tasks assessed/performed       Past Medical History:  Diagnosis Date  . Acute cystitis without hematuria   . Acute diastolic CHF (congestive heart failure) ([place])   . Arthritis   . Dyspnea   . Dysrhythmia   . Fever of unknown origin 03/19/2017  . Hyperlipidemia   . Hypertension    denies at preop  . Multifocal pneumonia   . Neuromuscular disorder ([place])    neuropathy left arm and foot  . Osteopenia   . Paralysis ([place])    partial left side from CVA   . Persistent atrial fibrillation   . PONV (postoperative nausea and vomiting)   . Pre-diabetes   . Stroke [facility]) 2013   hemmorahgic    Past Surgical History:  Procedure Laterality Date  . ANKLE SURGERY    . APPENDECTOMY    . CHOLECYSTECTOMY    . HERNIA REPAIR     Esophagus  . JOINT REPLACEMENT     total- right partial- left  . MASTECTOMY PARTIAL / LUMPECTOMY  2012   left  . ORIF ANKLE FRACTURE Left 07/20/2018   Procedure: OPEN REDUCTION INTERNAL FIXATION (ORIF) ANKLE FRACTURE;  Surgeon: Wylene Simmer, MD;  Location: [place];  Service: Orthopedics;  Laterality: Left;  . TOTAL KNEE ARTHROPLASTY Left 01/27/2019   Procedure: TOTAL KNEE ARTHROPLASTY;  Surgeon: Vickey Huger, MD;  [facility];  Service: Orthopedics;  Laterality: Left;    There were no vitals filed for this visit.  Subjective Assessment - 04/30/19 1021    Subjective   Patient had mentioned when can she try to go to the bathroom on her own.  I met her at her house today to work on a real world scenario of her transferring and walking    Currently in Pain?  Yes    Pain Score  3     Pain Location  Knee                       [facility] Adult PT Treatment/Exercise - 04/30/19 0001      Ambulation/Gait   Gait Comments  worked with patient today at her home, worked on her independent transfer from bed and chair to walker and then wallking to the toilet and then bed side commode.  She needed problem solving as she is set in her ways but some of that is unsafe and then at other times she has allowed others to do all the work and she has been a dependent transfer., see clinical impression      Knee/Hip Exercises: Seated   Long Arc Quad  Strengthening;Left;10 reps;3 sets    [employer] Weight  3 lbs.    Other Seated Knee/Hip Exercises  ankle green tband exercises    Other Seated Knee/Hip Exercises  push with hip and knee x 15    Hamstring Curl  Strengthening;Both;2 sets;10 reps    Hamstring Limitations  green tband               PT Short Term Goals - 03/20/19 1140      PT SHORT TERM GOAL #1   Title  independent with initial HEP    Status  Achieved        PT Long Term Goals - 04/30/19 1152      PT LONG TERM GOAL #4   Title  transfer independently with set up    Status  On-going            Plan - 04/30/19 1146    Clinical Impression Statement  Went to the patient;'s house today.  Bed mobility form supine to sit can be done with verbal cues, she can use the bed to get up mostly but I feel tha tshe needs core work, transfer fro bed to standing with CGA, she then can walk into the bathroom, the issue is there is a ramp into the shower that she has to maneuver the walker over, this is difficult with her shoulders and she got fatigued with this, she was safe turning around.  Without me she describes that she wheels in with aide and  stands using the grab bar at the side of the toilet, when she sits she twists the TKR and causes pain.  When getting up from the toilet, it is 18" high, she really could not do with the walker, needed Mod A to maxA to stand from the toilet, again coming out of the toilet she has to negotiate the ramp and with her arms she really worked hard with this and barely made it out of the bathroom before needing to sit.  We then worked on going form her bed to the living room to her chair, she did well until she got to where the floor changed from wood to rock, this again caused issues with her moving the walker and she had to stop and sit.    PT Next Visit Plan  work on strength and function    Consulted and Agree with Plan of Care  Patient       Patient will benefit from skilled therapeutic intervention in order to improve the following deficits and impairments:  Abnormal gait, Decreased coordination, Decreased range of motion, Difficulty walking, Cardiopulmonary status limiting activity, Decreased endurance, Increased muscle spasms, Impaired UE functional use, Decreased activity tolerance, Pain, Decreased balance, Postural dysfunction, Decreased strength, Decreased mobility  Visit Diagnosis: Difficulty in walking, not elsewhere classified  Muscle weakness (generalized)  Stiffness of left knee, not elsewhere classified  Late effects of CVA (cerebrovascular accident)  Localized swelling, mass and lump, left lower limb  Acute pain of left knee  Hemiplegia and hemiparesis following cerebral infarction affecting left non-dominant side [facility])     Problem List Patient Active Problem List   Diagnosis Date Noted  . S/P total knee replacement 01/27/2019  . Closed left ankle fracture 07/20/2018  . Acute ankle pain 05/23/2018  . Respiratory failure with hypoxia ([place]) 08/30/2017  . UTI (urinary tract infection) 07/05/2017  . Hypoxemia   . Heart failure with preserved ejection fraction ([place]), Grade 3  diastolic dysfunction 29/52/8413  . Atrial fibrillation with RVR ([place])   . PAF (paroxysmal atrial fibrillation) ([place])   . Metabolic syndrome 24/40/1027  . Encounter for preventive health examination 02/17/2016  . Morbid obesity ([place])  06/17/2015  . Hypomagnesemia 04/24/2014  . Hemiparesis affecting left side as late effect of cerebrovascular accident ([place]) 04/24/2014  . Nontraumatic cerebral hemorrhage ([place]) 04/30/2012  . DM (diabetes mellitus) with complications ([place]) 19/15/5027  . OSTEOPENIA 01/21/2009  . UNSPECIFIED VITAMIN D DEFICIENCY 11/19/2007  . ESSENTIAL HYPERTENSION, BENIGN 11/19/2007  . HYPERCHOLESTEROLEMIA 10/25/2006  . GASTROESOPHAGEAL REFLUX, NO ESOPHAGITIS 10/25/2006  . DIVERTICULOSIS OF COLON 10/25/2006  . Osteoarthritis 10/25/2006  . CERVICAL SPINE DISORDER, NOS 10/25/2006    Sumner Boast., PT 04/30/2019, 11:53 AM  [place] [address] [place], [place], [address] Phone: [phone]   Fax:  [phone]  Name: Karen Jackson MRN: [id number] Date of Birth: [birth year]

## 2019-05-02 ENCOUNTER — Encounter: Payer: Self-pay | Admitting: Physical Therapy

## 2019-05-02 ENCOUNTER — Other Ambulatory Visit: Payer: Self-pay

## 2019-05-02 ENCOUNTER — Ambulatory Visit: Payer: Medicare Other | Admitting: Physical Therapy

## 2019-05-02 DIAGNOSIS — R262 Difficulty in walking, not elsewhere classified: Secondary | ICD-10-CM | POA: Diagnosis not present

## 2019-05-02 DIAGNOSIS — M6281 Muscle weakness (generalized): Secondary | ICD-10-CM

## 2019-05-02 DIAGNOSIS — I6990 Unspecified sequelae of unspecified cerebrovascular disease: Secondary | ICD-10-CM | POA: Diagnosis not present

## 2019-05-02 DIAGNOSIS — M25562 Pain in left knee: Secondary | ICD-10-CM | POA: Diagnosis not present

## 2019-05-02 DIAGNOSIS — R2242 Localized swelling, mass and lump, left lower limb: Secondary | ICD-10-CM | POA: Diagnosis not present

## 2019-05-02 DIAGNOSIS — I69354 Hemiplegia and hemiparesis following cerebral infarction affecting left non-dominant side: Secondary | ICD-10-CM

## 2019-05-02 DIAGNOSIS — M25662 Stiffness of left knee, not elsewhere classified: Secondary | ICD-10-CM

## 2019-05-02 NOTE — Therapy (Signed)
[place], [place], [address] Phone: [phone]   Fax:  [phone]  Physical Therapy Treatment  Patient Details  Name: Karen Jackson MRN: [id number] Date of Birth: 09/23/1940 Referring Provider (PT): Lucey   Encounter Date: 05/02/2019  PT End of Session - 05/02/19 1109    Visit Number  24    Date for PT Re-Evaluation  05/04/19    PT Start Time  1028    PT Stop Time  1110    PT Time Calculation (min)  42 min    Activity Tolerance  Patient tolerated treatment well    Behavior During Therapy  [facility] for tasks assessed/performed       Past Medical History:  Diagnosis Date  . Acute cystitis without hematuria   . Acute diastolic CHF (congestive heart failure) ([place])   . Arthritis   . Dyspnea   . Dysrhythmia   . Fever of unknown origin 03/19/2017  . Hyperlipidemia   . Hypertension    denies at preop  . Multifocal pneumonia   . Neuromuscular disorder ([place])    neuropathy left arm and foot  . Osteopenia   . Paralysis ([place])    partial left side from CVA   . Persistent atrial fibrillation   . PONV (postoperative nausea and vomiting)   . Pre-diabetes   . Stroke [facility]) 2013   hemmorahgic    Past Surgical History:  Procedure Laterality Date  . ANKLE SURGERY    . APPENDECTOMY    . CHOLECYSTECTOMY    . HERNIA REPAIR     Esophagus  . JOINT REPLACEMENT     total- right partial- left  . MASTECTOMY PARTIAL / LUMPECTOMY  2012   left  . ORIF ANKLE FRACTURE Left 07/20/2018   Procedure: OPEN REDUCTION INTERNAL FIXATION (ORIF) ANKLE FRACTURE;  Surgeon: Wylene Simmer, MD;  Location: [facility];  Service: Orthopedics;  Laterality: Left;  . TOTAL KNEE ARTHROPLASTY Left 01/27/2019   Procedure: TOTAL KNEE ARTHROPLASTY;  Surgeon: Vickey Huger, MD;  [facility];  Service: Orthopedics;  Laterality: Left;    There were no vitals filed for this visit.  Subjective Assessment - 05/02/19 1037    Subjective  I am  congested today.  I transferred well today    Currently in Pain?  No/denies                       [facility] Adult PT Treatment/Exercise - 05/02/19 0001      Ambulation/Gait   Gait Comments  walking with FWW, 60' x 2 and then 20'      High Level Balance   High Level Balance Activities  Side stepping      Knee/Hip Exercises: Aerobic   Nustep  level 5 x 7 minutes'      Knee/Hip Exercises: Standing   Other Standing Knee Exercises  toe touches on a half foam noodle with HHA, very difficult for her to stand on the left leg and pick up the right    Other Standing Knee Exercises  standing with walker and worked on picking walker up and putting the front wheels over a dowel rod               PT Short Term Goals - 03/20/19 1140      PT SHORT TERM GOAL #1   Title  independent with initial HEP    Status  Achieved  PT Long Term Goals - 05/02/19 1111      PT LONG TERM GOAL #1   Title  walk with hand hold assist x 100 feet    Status  Partially Met      PT LONG TERM GOAL #5   Title  report no difficulty pulling her pants up when going to the bathroom    Status  On-going            Plan - 05/02/19 1110    Clinical Impression Statement  Patient did so much better today compared to Monday, again I do not know if it was the weather or her not doing anything over the weekend.  Today did a great job taking about 4-5 steps prior to her getting moving forward, she also did much better with her transfers doing a complete turn prior to sitting    PT Next Visit Plan  work on strength and function    Consulted and Agree with Plan of Care  Patient       Patient will benefit from skilled therapeutic intervention in order to improve the following deficits and impairments:     Visit Diagnosis: Difficulty in walking, not elsewhere classified  Muscle weakness (generalized)  Stiffness of left knee, not elsewhere classified  Late effects of CVA (cerebrovascular  accident)  Localized swelling, mass and lump, left lower limb  Acute pain of left knee  Hemiplegia and hemiparesis following cerebral infarction affecting left non-dominant side [facility])     Problem List Patient Active Problem List   Diagnosis Date Noted  . S/P total knee replacement 01/27/2019  . Closed left ankle fracture 07/20/2018  . Acute ankle pain 05/23/2018  . Respiratory failure with hypoxia ([place]) 08/30/2017  . UTI (urinary tract infection) 07/05/2017  . Hypoxemia   . Heart failure with preserved ejection fraction ([place]), Grade 3 diastolic dysfunction 40/98/1191  . Atrial fibrillation with RVR ([place])   . PAF (paroxysmal atrial fibrillation) ([place])   . Metabolic syndrome 47/82/9562  . Encounter for preventive health examination 02/17/2016  . Morbid obesity ([place]) 06/17/2015  . Hypomagnesemia 04/24/2014  . Hemiparesis affecting left side as late effect of cerebrovascular accident ([place]) 04/24/2014  . Nontraumatic cerebral hemorrhage ([place]) 04/30/2012  . DM (diabetes mellitus) with complications ([place]) 13/03/6577  . OSTEOPENIA 01/21/2009  . UNSPECIFIED VITAMIN D DEFICIENCY 11/19/2007  . ESSENTIAL HYPERTENSION, BENIGN 11/19/2007  . HYPERCHOLESTEROLEMIA 10/25/2006  . GASTROESOPHAGEAL REFLUX, NO ESOPHAGITIS 10/25/2006  . DIVERTICULOSIS OF COLON 10/25/2006  . Osteoarthritis 10/25/2006  . CERVICAL SPINE DISORDER, NOS 10/25/2006    Sumner Boast., PT 05/02/2019, 11:12 AM  [place] [address] [place], [place], [address] Phone: [phone]   Fax:  [phone]  Name: KEIARAH ORLOWSKI MRN: [id number] Date of Birth: [birth year]

## 2019-05-07 ENCOUNTER — Other Ambulatory Visit: Payer: Self-pay

## 2019-05-07 ENCOUNTER — Encounter: Payer: Self-pay | Admitting: Physical Therapy

## 2019-05-07 ENCOUNTER — Ambulatory Visit: Payer: Medicare Other | Admitting: Physical Therapy

## 2019-05-07 DIAGNOSIS — R262 Difficulty in walking, not elsewhere classified: Secondary | ICD-10-CM

## 2019-05-07 DIAGNOSIS — I69354 Hemiplegia and hemiparesis following cerebral infarction affecting left non-dominant side: Secondary | ICD-10-CM

## 2019-05-07 DIAGNOSIS — M6281 Muscle weakness (generalized): Secondary | ICD-10-CM

## 2019-05-07 DIAGNOSIS — M25562 Pain in left knee: Secondary | ICD-10-CM | POA: Diagnosis not present

## 2019-05-07 DIAGNOSIS — M25662 Stiffness of left knee, not elsewhere classified: Secondary | ICD-10-CM | POA: Diagnosis not present

## 2019-05-07 DIAGNOSIS — R2242 Localized swelling, mass and lump, left lower limb: Secondary | ICD-10-CM | POA: Diagnosis not present

## 2019-05-07 DIAGNOSIS — M25572 Pain in left ankle and joints of left foot: Secondary | ICD-10-CM

## 2019-05-07 DIAGNOSIS — I6990 Unspecified sequelae of unspecified cerebrovascular disease: Secondary | ICD-10-CM | POA: Diagnosis not present

## 2019-05-07 NOTE — Therapy (Signed)
[place], [place], [address] Phone: [phone]   Fax:  [phone]  Physical Therapy Treatment  Patient Details  Name: Karen Jackson MRN: [id number] Date of Birth: 1941/01/09 Referring Provider (PT): Lucey   Encounter Date: 05/07/2019  PT End of Session - 05/07/19 1146    Visit Number  25    Date for PT Re-Evaluation  06/06/19    PT Start Time  1055    PT Stop Time  1140    PT Time Calculation (min)  45 min    Activity Tolerance  Patient tolerated treatment well    Behavior During Therapy  [facility] for tasks assessed/performed       Past Medical History:  Diagnosis Date  . Acute cystitis without hematuria   . Acute diastolic CHF (congestive heart failure) ([place])   . Arthritis   . Dyspnea   . Dysrhythmia   . Fever of unknown origin 03/19/2017  . Hyperlipidemia   . Hypertension    denies at preop  . Multifocal pneumonia   . Neuromuscular disorder ([place])    neuropathy left arm and foot  . Osteopenia   . Paralysis ([place])    partial left side from CVA   . Persistent atrial fibrillation   . PONV (postoperative nausea and vomiting)   . Pre-diabetes   . Stroke [facility]) 2013   hemmorahgic    Past Surgical History:  Procedure Laterality Date  . ANKLE SURGERY    . APPENDECTOMY    . CHOLECYSTECTOMY    . HERNIA REPAIR     Esophagus  . JOINT REPLACEMENT     total- right partial- left  . MASTECTOMY PARTIAL / LUMPECTOMY  2012   left  . ORIF ANKLE FRACTURE Left 07/20/2018   Procedure: OPEN REDUCTION INTERNAL FIXATION (ORIF) ANKLE FRACTURE;  Surgeon: Wylene Simmer, MD;  Location: [facility];  Service: Orthopedics;  Laterality: Left;  . TOTAL KNEE ARTHROPLASTY Left 01/27/2019   Procedure: TOTAL KNEE ARTHROPLASTY;  Surgeon: Vickey Huger, MD;  [facility];  Service: Orthopedics;  Laterality: Left;    There were no vitals filed for this visit.  Subjective Assessment - 05/07/19 1111    Subjective   Patient reports some left arm pain on Monday, reports that she did not do much over the weekend    Currently in Pain?  No/denies         [facility] PT Assessment - 05/07/19 0001      Assessment   Medical Diagnosis  s/p left TKR, weakness, difficulty walking    Referring Provider (PT)  Lucey    Onset Date/Surgical Date  01/27/19      AROM   Left Knee Extension  0    Left Knee Flexion  100                   [facility] Adult PT Treatment/Exercise - 05/07/19 0001      Ambulation/Gait   Gait Comments  used FWW a few cues for posture and to keep going, 40', 25' and 64', this does fatigue her and she wants to sit quickly, with cues she did better today with her transfers and the ability to fully turn prior to sitting      High Level Balance   High Level Balance Activities  Side stepping    High Level Balance Comments  tried some single ;leg standing using her walker, 2" toe touches      Knee/Hip  Exercises: Aerobic   Nustep  level 5 x 7 minutes'      Manual Therapy   Manual Therapy  Passive ROM    Manual therapy comments  PROM of the left shoulder, approximation of the fingers with motions to stimulate increased function, scar mobilization, ankle ROM and approximation    Passive ROM  motion of the knee and then with pressure through the ankle for eversion to help with not rolling ankle               PT Short Term Goals - 03/20/19 1140      PT SHORT TERM GOAL #1   Title  independent with initial HEP    Status  Achieved        PT Long Term Goals - 05/07/19 1149      PT LONG TERM GOAL #1   Title  walk with hand hold assist x 100 feet    Status  Partially Met      PT LONG TERM GOAL #2   Title  increased right LE strength to 4-/5    Status  Partially Met      PT LONG TERM GOAL #3   Title  walk 100 feet with SBA    Status  On-going      PT LONG TERM GOAL #4   Title  transfer independently with set up    Status  On-going      PT LONG TERM GOAL #5   Title   report no difficulty pulling her pants up when going to the bathroom    Status  On-going            Plan - 05/07/19 1146    Clinical Impression Statement  I had worries about her today as she had a long weekend and did not do much, however she did very well.  with the single leg stance she really cannot stand on the left only for more than 1/2 a second, she does need a lot of cues and then assist with knee and bottom to stand up and bear weight on the left.  Much improved with the transfers with cues to get fully turned prior to sitting    PT Frequency  3x / week    PT Duration  4 weeks    PT Treatment/Interventions  ADLs/Self Care Home Management;Cryotherapy;Electrical Stimulation;Iontophoresis 51m/ml Dexamethasone;Moist Heat;Ultrasound;Gait training;Balance training;Therapeutic exercise;Therapeutic activities;Functional mobility training;Patient/family education;Manual techniques;Dry needling    PT Next Visit Plan  we will continue to work on her functional independence    Consulted and Agree with Plan of Care  Patient       Patient will benefit from skilled therapeutic intervention in order to improve the following deficits and impairments:  Abnormal gait, Decreased coordination, Decreased range of motion, Difficulty walking, Cardiopulmonary status limiting activity, Decreased endurance, Increased muscle spasms, Impaired UE functional use, Decreased activity tolerance, Pain, Decreased balance, Postural dysfunction, Decreased strength, Decreased mobility  Visit Diagnosis: Difficulty in walking, not elsewhere classified - Plan: PT plan of care cert/re-cert  Muscle weakness (generalized) - Plan: PT plan of care cert/re-cert  Stiffness of left knee, not elsewhere classified - Plan: PT plan of care cert/re-cert  Late effects of CVA (cerebrovascular accident) - Plan: PT plan of care cert/re-cert  Localized swelling, mass and lump, left lower limb - Plan: PT plan of care  cert/re-cert  Acute pain of left knee - Plan: PT plan of care cert/re-cert  Hemiplegia and hemiparesis following cerebral infarction affecting left  non-dominant side ([place]) - Plan: PT plan of care cert/re-cert  Pain in left ankle and joints of left foot - Plan: PT plan of care cert/re-cert     Problem List Patient Active Problem List   Diagnosis Date Noted  . S/P total knee replacement 01/27/2019  . Closed left ankle fracture 07/20/2018  . Acute ankle pain 05/23/2018  . Respiratory failure with hypoxia ([place]) 08/30/2017  . UTI (urinary tract infection) 07/05/2017  . Hypoxemia   . Heart failure with preserved ejection fraction ([place]), Grade 3 diastolic dysfunction 75/30/1040  . Atrial fibrillation with RVR ([place])   . PAF (paroxysmal atrial fibrillation) ([place])   . Metabolic syndrome 45/91/3685  . Encounter for preventive health examination 02/17/2016  . Morbid obesity ([place]) 06/17/2015  . Hypomagnesemia 04/24/2014  . Hemiparesis affecting left side as late effect of cerebrovascular accident ([place]) 04/24/2014  . Nontraumatic cerebral hemorrhage ([place]) 04/30/2012  . DM (diabetes mellitus) with complications ([place]) 99/23/4144  . OSTEOPENIA 01/21/2009  . UNSPECIFIED VITAMIN D DEFICIENCY 11/19/2007  . ESSENTIAL HYPERTENSION, BENIGN 11/19/2007  . HYPERCHOLESTEROLEMIA 10/25/2006  . GASTROESOPHAGEAL REFLUX, NO ESOPHAGITIS 10/25/2006  . DIVERTICULOSIS OF COLON 10/25/2006  . Osteoarthritis 10/25/2006  . CERVICAL SPINE DISORDER, NOS 10/25/2006    Sumner Boast., PT 05/07/2019, 11:51 AM  [place] [address] [place], [place], [address] Phone: [phone]   Fax:  [phone]  Name: COY VANDOREN MRN: [id number] Date of Birth: 04-01-1941

## 2019-05-08 ENCOUNTER — Other Ambulatory Visit: Payer: Self-pay

## 2019-05-08 ENCOUNTER — Ambulatory Visit (INDEPENDENT_AMBULATORY_CARE_PROVIDER_SITE_OTHER): Payer: Medicare Other | Admitting: Podiatry

## 2019-05-08 VITALS — Temp 98.1°F

## 2019-05-08 DIAGNOSIS — B351 Tinea unguium: Secondary | ICD-10-CM | POA: Diagnosis not present

## 2019-05-08 DIAGNOSIS — L603 Nail dystrophy: Secondary | ICD-10-CM

## 2019-05-08 NOTE — Progress Notes (Addendum)
Subjective:  Patient ID: Karen [place], female    DOB: 1940/09/04,  MRN: [id number]  Chief Complaint  Patient presents with  . Nail Problem    Nail trim 1-5 bilateral. Right 1st nail is loose, no known injury, 2 weeks duration, denies drainage.    78 y.o. female presents with the above complaint. Hx as above.   Review of Systems: Negative except as noted in the HPI. Denies N/V/F/Ch.  Past Medical History:  Diagnosis Date  . Acute cystitis without hematuria   . Acute diastolic CHF (congestive heart failure) ([place])   . Arthritis   . Dyspnea   . Dysrhythmia   . Fever of unknown origin 03/19/2017  . Hyperlipidemia   . Hypertension    denies at preop  . Multifocal pneumonia   . Neuromuscular disorder ([place])    neuropathy left arm and foot  . Osteopenia   . Paralysis ([place])    partial left side from CVA   . Persistent atrial fibrillation   . PONV (postoperative nausea and vomiting)   . Pre-diabetes   . Stroke [facility]) 2013   hemmorahgic    Current Outpatient Medications:  .  atorvastatin (LIPITOR) 20 MG tablet, TAKE 1 TABLET BY MOUTH EVERY DAY (Patient taking differently: Take 20 mg by mouth at bedtime. ), Disp: 90 tablet, Rfl: 3 .  Calcium Carb-Cholecalciferol (CALCIUM CARBONATE-VITAMIN D3) 600-400 MG-UNIT TABS, Take 1 tablet by mouth daily., Disp: , Rfl:  .  docusate sodium (COLACE) 100 MG capsule, Take 1 capsule (100 mg total) by mouth 2 (two) times daily. While taking narcotic pain medicine. (Patient taking differently: Take 100 mg by mouth daily as needed for moderate constipation. While taking narcotic pain medicine.), Disp: 30 capsule, Rfl: 0 .  furosemide (LASIX) 80 MG tablet, TAKE 1 TABLET BY MOUTH EVERY DAY, Disp: 90 tablet, Rfl: 3 .  gabapentin (NEURONTIN) 100 MG capsule, TAKE 1 CAPSULE BY MOUTH THREE TIMES DAILY (Patient taking differently: Take 100 mg by mouth 3 (three) times daily. ), Disp: 270 capsule, Rfl: 3 .  MAGNESIUM-OXIDE 400 (241.3 Mg) MG tablet, TAKE 1 TABLET BY  MOUTH DAILY, Disp: 90 tablet, Rfl: 3 .  metoprolol tartrate (LOPRESSOR) 50 MG tablet, Take 0.5 tablets (25 mg total) by mouth 2 (two) times daily., Disp: 180 tablet, Rfl: 3 .  Multiple Vitamins-Minerals (HAIR/SKIN/NAILS) CAPS, Take 3 capsules by mouth daily., Disp: , Rfl:  .  NARCAN 4 MG/0.1ML LIQD nasal spray kit, NAR REP ALN, Disp: , Rfl:  .  omeprazole (PRILOSEC) 20 MG capsule, TAKE 1 CAPSULE BY MOUTH EVERY DAY, Disp: 90 capsule, Rfl: 3 .  ondansetron (ZOFRAN) 8 MG tablet, TK 1 T PO Q 8 H PRN N FOR UP TO 7 DAYS, Disp: , Rfl:  .  oxyCODONE (OXY IR/ROXICODONE) 5 MG immediate release tablet, Take 1-2 tablets (5-10 mg total) by mouth every 6 (six) hours as needed for moderate pain (pain score 4-6)., Disp: 40 tablet, Rfl: 0 .  polyethylene glycol (MIRALAX / GLYCOLAX) packet, Take 17 g by mouth daily. (Patient taking differently: Take 17 g by mouth daily as needed for severe constipation. ), Disp: 30 each, Rfl: 1 .  senna (SENOKOT) 8.6 MG TABS tablet, Take 2 tablets (17.2 mg total) by mouth 2 (two) times daily. (Patient taking differently: Take 2 tablets by mouth daily as needed for moderate constipation. ), Disp: 30 each, Rfl: 0 .  TURMERIC PO, Take 1 capsule by mouth daily., Disp: , Rfl:  .  aspirin EC 325 MG  EC tablet, Take 1 tablet (325 mg total) by mouth 2 (two) times daily. (Patient not taking: Reported on 05/08/2019), Disp: 30 tablet, Rfl: 0 .  celecoxib (CELEBREX) 200 MG capsule, TK 1 C PO QD WF, Disp: , Rfl:  .  cephALEXin (KEFLEX) 250 MG capsule, Take 1 capsule (250 mg total) by mouth 4 (four) times daily. (Patient not taking: Reported on 05/08/2019), Disp: 28 capsule, Rfl: 0 .  tiZANidine (ZANAFLEX) 2 MG tablet, Take 1 tablet (2 mg total) by mouth every 6 (six) hours as needed. (Patient not taking: Reported on 05/08/2019), Disp: 50 tablet, Rfl: 0 .  traZODone (DESYREL) 50 MG tablet, Take 1 tablet (50 mg total) by mouth at bedtime. (Patient not taking: Reported on 05/08/2019), Disp: 90 tablet, Rfl:  3  Social History   Tobacco Use  Smoking Status Former Smoker  . Packs/day: 0.50  . Years: 23.00  . Pack years: 11.50  . Types: Cigarettes  . Start date: 08/28/1957  . Quit date: 03/09/1981  . Years since quitting: 38.1  Smokeless Tobacco Never Used    Allergies  Allergen Reactions  . Codeine Phosphate Nausea And Vomiting  . Simvastatin Other (See Comments)    Myalgia  Changed brand ok now   Objective:   Vitals:   05/08/19 1012  Temp: 98.1 F (36.7 C)   There is no height or weight on file to calculate BMI. Constitutional Well developed. Well nourished.  Vascular Dorsalis pedis pulses palpable bilaterally. Posterior tibial pulses palpable bilaterally. Capillary refill normal to all digits.  No cyanosis or clubbing noted. Pedal hair growth normal.  Neurologic Normal speech. Oriented to person, place, and time. Epicritic sensation to light touch grossly present bilaterally.  Dermatologic Nails right hallux loosened, dystrophic No open wounds. No skin lesions.  Orthopedic: Normal joint ROM without pain or crepitus bilaterally. No visible deformities. No bony tenderness.   Radiographs: None Assessment:   1. Onychomycosis   2. Nail dystrophy    Plan:  Patient was evaluated and treated and all questions answered.  Onychomycosis/Onycholysis Right Hallux -Nail avulsed as below -post procedure instructions ggiven   Procedure: Avulsion of toenail Location: Right 1st  Anesthesia: Lidocaine 1% plain; 1.5 mL and Marcaine 0.5% plain; 1.5 mL, digital block. Skin Prep: Betadine. Dressing: Silvadene; telfa; dry, sterile, compression dressing. Technique: Following skin prep, the nail was freed and avulsed with a hemostat. The area was cleansed. The tourniquet was then removed and sterile dressing applied. Disposition: Patient tolerated procedure well.    Return in about 2 weeks (around 05/22/2019) for Nail Check with Nurse.

## 2019-05-09 ENCOUNTER — Ambulatory Visit: Payer: Medicare Other | Admitting: Physical Therapy

## 2019-05-12 ENCOUNTER — Other Ambulatory Visit: Payer: Self-pay

## 2019-05-12 ENCOUNTER — Encounter: Payer: Self-pay | Admitting: Physical Therapy

## 2019-05-12 ENCOUNTER — Ambulatory Visit: Payer: Medicare Other | Admitting: Physical Therapy

## 2019-05-12 DIAGNOSIS — M25562 Pain in left knee: Secondary | ICD-10-CM

## 2019-05-12 DIAGNOSIS — I6990 Unspecified sequelae of unspecified cerebrovascular disease: Secondary | ICD-10-CM | POA: Diagnosis not present

## 2019-05-12 DIAGNOSIS — M25662 Stiffness of left knee, not elsewhere classified: Secondary | ICD-10-CM

## 2019-05-12 DIAGNOSIS — R262 Difficulty in walking, not elsewhere classified: Secondary | ICD-10-CM

## 2019-05-12 DIAGNOSIS — M6281 Muscle weakness (generalized): Secondary | ICD-10-CM

## 2019-05-12 DIAGNOSIS — R2242 Localized swelling, mass and lump, left lower limb: Secondary | ICD-10-CM | POA: Diagnosis not present

## 2019-05-12 DIAGNOSIS — I69354 Hemiplegia and hemiparesis following cerebral infarction affecting left non-dominant side: Secondary | ICD-10-CM

## 2019-05-12 NOTE — Therapy (Signed)
[facility] [address] [place], [place], [address] Phone: [phone]   Fax:  [phone]  Physical Therapy Treatment  Patient Details  Name: Karen Jackson MRN: [id number] Date of Birth: 04/12/1941 Referring Provider (PT): Lucey   Encounter Date: 05/12/2019  PT End of Session - 05/12/19 1144    Visit Number  26    Date for PT Re-Evaluation  06/06/19    PT Start Time  1055    PT Stop Time  1145    PT Time Calculation (min)  50 min    Activity Tolerance  Patient tolerated treatment well    Behavior During Therapy  WFL for tasks assessed/performed       Past Medical History:  Diagnosis Date  . Acute cystitis without hematuria   . Acute diastolic CHF (congestive heart failure) ([facility])   . Arthritis   . Dyspnea   . Dysrhythmia   . Fever of unknown origin 03/19/2017  . Hyperlipidemia   . Hypertension    denies at preop  . Multifocal pneumonia   . Neuromuscular disorder ([facility])    neuropathy left arm and foot  . Osteopenia   . Paralysis ([facility])    partial left side from CVA   . Persistent atrial fibrillation   . PONV (postoperative nausea and vomiting)   . Pre-diabetes   . Stroke ([facility]) 2013   hemmorahgic    Past Surgical History:  Procedure Laterality Date  . ANKLE SURGERY    . APPENDECTOMY    . CHOLECYSTECTOMY    . HERNIA REPAIR     Esophagus  . JOINT REPLACEMENT     total- right partial- left  . MASTECTOMY PARTIAL / LUMPECTOMY  2012   left  . ORIF ANKLE FRACTURE Left 07/20/2018   Procedure: OPEN REDUCTION INTERNAL FIXATION (ORIF) ANKLE FRACTURE;  Surgeon: Hewitt, John, MD;  Location: [facility];  Service: Orthopedics;  Laterality: Left;  . TOTAL KNEE ARTHROPLASTY Left 01/27/2019   Procedure: TOTAL KNEE ARTHROPLASTY;  Surgeon: Lucey, Steve, MD;  Location: [facility];  Service: Orthopedics;  Laterality: Left;    There were no vitals filed for this visit.  Subjective Assessment - 05/12/19 1103    Subjective   Patient reports that she had her right great toe nail removed last Thursday, cancelled appointment with us last Friday.  Reports that she is doing better but still very sore and tender    Currently in Pain?  Yes    Pain Score  3     Pain Location  Toe (Comment which one)    Pain Orientation  Right    Aggravating Factors   toenail was removed last week                       [facility] Adult PT Treatment/Exercise - 05/12/19 0001      Ambulation/Gait   Gait Comments  FWW close CGA, 60' x 2      Knee/Hip Exercises: Aerobic   Nustep  level 5 x 7 minutes'      Knee/Hip Exercises: Seated   Long Arc Quad  Strengthening;Left;10 reps;3 sets    Long Arc Quad Weight  5 lbs.    Ball Squeeze  25    Clamshell with TheraBand  Red    Other Seated Knee/Hip Exercises  ankle green tband exercises    Hamstring Curl  Strengthening;Both;2 sets;10 reps    Hamstring Limitations  green tband        Manual Therapy   Manual Therapy  Passive ROM    Manual therapy comments  PROM of the left shoulder, approximation of the fingers with motions to stimulate increased function, scar mobilization, ankle ROM and approximation    Passive ROM  motion of the knee and then with pressure through the ankle for eversion to help with not rolling ankle               PT Short Term Goals - 03/20/19 1140      PT SHORT TERM GOAL #1   Title  independent with initial HEP    Status  Achieved        PT Long Term Goals - 05/07/19 1149      PT LONG TERM GOAL #1   Title  walk with hand hold assist x 100 feet    Status  Partially Met      PT LONG TERM GOAL #2   Title  increased right LE strength to 4-/5    Status  Partially Met      PT LONG TERM GOAL #3   Title  walk 100 feet with SBA    Status  On-going      PT LONG TERM GOAL #4   Title  transfer independently with set up    Status  On-going      PT LONG TERM GOAL #5   Title  report no difficulty pulling her pants up when going to the bathroom     Status  On-going            Plan - 05/12/19 1144    Clinical Impression Statement  Patient had her right great toe nail removed last week.  She did become short of breath with the walking, after 12' she did need to sit, her HR would go up to 128 and her O2 saturation would drop to 93%, not bad but again her feeling like she needs to rest, she does c/o the mask and having increase difficulty breathing, we did not do as much with standing due to the toe    PT Next Visit Plan  we will continue to work on her functional independence    Consulted and Agree with Plan of Care  Patient       Patient will benefit from skilled therapeutic intervention in order to improve the following deficits and impairments:  Abnormal gait, Decreased coordination, Decreased range of motion, Difficulty walking, Cardiopulmonary status limiting activity, Decreased endurance, Increased muscle spasms, Impaired UE functional use, Decreased activity tolerance, Pain, Decreased balance, Postural dysfunction, Decreased strength, Decreased mobility  Visit Diagnosis: Difficulty in walking, not elsewhere classified  Muscle weakness (generalized)  Late effects of CVA (cerebrovascular accident)  Stiffness of left knee, not elsewhere classified  Localized swelling, mass and lump, left lower limb  Acute pain of left knee  Hemiplegia and hemiparesis following cerebral infarction affecting left non-dominant side [facility])     Problem List Patient Active Problem List   Diagnosis Date Noted  . S/P total knee replacement 01/27/2019  . Closed left ankle fracture 07/20/2018  . Recurrent left knee instability 07/05/2018  . Acute ankle pain 05/23/2018  . Respiratory failure with hypoxia ([place]) 08/30/2017  . UTI (urinary tract infection) 07/05/2017  . Hypoxemia   . Heart failure with preserved ejection fraction ([place]), Grade 3 diastolic dysfunction 94/17/4081  . Atrial fibrillation with RVR ([place])   . PAF (paroxysmal atrial  fibrillation) ([place])   . Metabolic syndrome 44/81/8563  . Encounter for  preventive health examination 02/17/2016  . Bilateral impacted cerumen 01/26/2016  . Sensorineural hearing loss (SNHL), bilateral 01/26/2016  . Morbid obesity ([place]) 06/17/2015  . Hypomagnesemia 04/24/2014  . Hemiparesis affecting left side as late effect of cerebrovascular accident ([place]) 04/24/2014  . Nontraumatic cerebral hemorrhage ([place]) 04/30/2012  . DM (diabetes mellitus) with complications ([place]) 51/76/1607  . OSTEOPENIA 01/21/2009  . UNSPECIFIED VITAMIN D DEFICIENCY 11/19/2007  . ESSENTIAL HYPERTENSION, BENIGN 11/19/2007  . HYPERCHOLESTEROLEMIA 10/25/2006  . GASTROESOPHAGEAL REFLUX, NO ESOPHAGITIS 10/25/2006  . DIVERTICULOSIS OF COLON 10/25/2006  . Osteoarthritis 10/25/2006  . CERVICAL SPINE DISORDER, NOS 10/25/2006    Sumner Boast., PT 05/12/2019, 11:48 AM  [place] [address] [address] [place], [place], [address] Phone: [phone]   Fax:  [phone]  Name: KHALIE WINCE MRN: [id number] Date of Birth: 1940-12-07

## 2019-05-14 ENCOUNTER — Other Ambulatory Visit: Payer: Self-pay

## 2019-05-14 ENCOUNTER — Ambulatory Visit: Payer: Medicare Other | Admitting: Physical Therapy

## 2019-05-14 ENCOUNTER — Encounter: Payer: Self-pay | Admitting: Physical Therapy

## 2019-05-14 DIAGNOSIS — M25562 Pain in left knee: Secondary | ICD-10-CM | POA: Diagnosis not present

## 2019-05-14 DIAGNOSIS — M25662 Stiffness of left knee, not elsewhere classified: Secondary | ICD-10-CM | POA: Diagnosis not present

## 2019-05-14 DIAGNOSIS — R262 Difficulty in walking, not elsewhere classified: Secondary | ICD-10-CM

## 2019-05-14 DIAGNOSIS — I6990 Unspecified sequelae of unspecified cerebrovascular disease: Secondary | ICD-10-CM

## 2019-05-14 DIAGNOSIS — I69354 Hemiplegia and hemiparesis following cerebral infarction affecting left non-dominant side: Secondary | ICD-10-CM

## 2019-05-14 DIAGNOSIS — R2242 Localized swelling, mass and lump, left lower limb: Secondary | ICD-10-CM | POA: Diagnosis not present

## 2019-05-14 DIAGNOSIS — M6281 Muscle weakness (generalized): Secondary | ICD-10-CM

## 2019-05-14 NOTE — Therapy (Signed)
[place], [place], [address] Phone: [phone]   Fax:  [phone]  Physical Therapy Treatment  Patient Details  Name: Karen Jackson MRN: [id number] Date of Birth: 1941/07/19 Referring Provider (PT): Lucey   Encounter Date: 05/14/2019  PT End of Session - 05/14/19 1146    Visit Number  27    Date for PT Re-Evaluation  06/06/19    PT Start Time  1055    PT Stop Time  1140    PT Time Calculation (min)  45 min    Activity Tolerance  Patient tolerated treatment well    Behavior During Therapy  [facility] for tasks assessed/performed       Past Medical History:  Diagnosis Date  . Acute cystitis without hematuria   . Acute diastolic CHF (congestive heart failure) ([place])   . Arthritis   . Dyspnea   . Dysrhythmia   . Fever of unknown origin 03/19/2017  . Hyperlipidemia   . Hypertension    denies at preop  . Multifocal pneumonia   . Neuromuscular disorder ([place])    neuropathy left arm and foot  . Osteopenia   . Paralysis ([place])    partial left side from CVA   . Persistent atrial fibrillation   . PONV (postoperative nausea and vomiting)   . Pre-diabetes   . Stroke [facility]) 2013   hemmorahgic    Past Surgical History:  Procedure Laterality Date  . ANKLE SURGERY    . APPENDECTOMY    . CHOLECYSTECTOMY    . HERNIA REPAIR     Esophagus  . JOINT REPLACEMENT     total- right partial- left  . MASTECTOMY PARTIAL / LUMPECTOMY  2012   left  . ORIF ANKLE FRACTURE Left 07/20/2018   Procedure: OPEN REDUCTION INTERNAL FIXATION (ORIF) ANKLE FRACTURE;  Surgeon: Wylene Simmer, MD;  Location: [place];  Service: Orthopedics;  Laterality: Left;  . TOTAL KNEE ARTHROPLASTY Left 01/27/2019   Procedure: TOTAL KNEE ARTHROPLASTY;  Surgeon: Vickey Huger, MD;  [facility];  Service: Orthopedics;  Laterality: Left;    There were no vitals filed for this visit.  Subjective Assessment - 05/14/19 1101    Subjective   Reports that the foot and toe are still very sore    Currently in Pain?  Yes    Pain Score  4     Pain Location  Toe (Comment which one)    Pain Orientation  Right                       [facility] Adult PT Treatment/Exercise - 05/14/19 0001      Ambulation/Gait   Gait Comments  with FWW, she struggled today, the left foot and toe were catching more today, she had to to 77', 40', 78' and 22', she would become very short of breath      High Level Balance   High Level Balance Activities  Side stepping    High Level Balance Comments  standing with walker, ball tap backs with one person gaurding patient, seated ball tap back with challenging her core and balance      Knee/Hip Exercises: Aerobic   Nustep  level 5 x 7 minutes'      Knee/Hip Exercises: Seated   Long Arc Quad  Strengthening;Left;10 reps;3 sets    [employer] Weight  5 lbs.    Ball Squeeze  25    Clamshell with  TheraBand  Red    Other Seated Knee/Hip Exercises  ankle green tband exercises    Marching  Strengthening;Left;2 sets;10 reps    Marching Weights  3 lbs.    Hamstring Curl  Strengthening;Both;2 sets;10 reps    Hamstring Limitations  green tband               PT Short Term Goals - 03/20/19 1140      PT SHORT TERM GOAL #1   Title  independent with initial HEP    Status  Achieved        PT Long Term Goals - 05/07/19 1149      PT LONG TERM GOAL #1   Title  walk with hand hold assist x 100 feet    Status  Partially Met      PT LONG TERM GOAL #2   Title  increased right LE strength to 4-/5    Status  Partially Met      PT LONG TERM GOAL #3   Title  walk 100 feet with SBA    Status  On-going      PT LONG TERM GOAL #4   Title  transfer independently with set up    Status  On-going      PT LONG TERM GOAL #5   Title  report no difficulty pulling her pants up when going to the bathroom    Status  On-going            Plan - 05/14/19 1146    Clinical Impression Statement   Patient struggled today, with the side stepping she was really leaning and pulling back, with the walking there seemed to be a little more spasticity in the left leg and she kept catching the toe, this made her tense up and really exhausted her, she would become short of breath and have to rest and shorter intervals    PT Next Visit Plan  see if we can get her moving forward may need to do some exercises with the hip to get it to loosen up    Consulted and Agree with Plan of Care  Patient       Patient will benefit from skilled therapeutic intervention in order to improve the following deficits and impairments:  Abnormal gait, Decreased coordination, Decreased range of motion, Difficulty walking, Cardiopulmonary status limiting activity, Decreased endurance, Increased muscle spasms, Impaired UE functional use, Decreased activity tolerance, Pain, Decreased balance, Postural dysfunction, Decreased strength, Decreased mobility  Visit Diagnosis: Difficulty in walking, not elsewhere classified  Muscle weakness (generalized)  Late effects of CVA (cerebrovascular accident)  Stiffness of left knee, not elsewhere classified  Acute pain of left knee  Hemiplegia and hemiparesis following cerebral infarction affecting left non-dominant side [facility])     Problem List Patient Active Problem List   Diagnosis Date Noted  . S/P total knee replacement 01/27/2019  . Closed left ankle fracture 07/20/2018  . Recurrent left knee instability 07/05/2018  . Acute ankle pain 05/23/2018  . Respiratory failure with hypoxia ([place]) 08/30/2017  . UTI (urinary tract infection) 07/05/2017  . Hypoxemia   . Heart failure with preserved ejection fraction ([place]), Grade 3 diastolic dysfunction 99/83/3825  . Atrial fibrillation with RVR ([place])   . PAF (paroxysmal atrial fibrillation) ([place])   . Metabolic syndrome 05/39/7673  . Encounter for preventive health examination 02/17/2016  . Bilateral impacted cerumen 01/26/2016   . Sensorineural hearing loss (SNHL), bilateral 01/26/2016  . Morbid obesity ([place]) 06/17/2015  . Hypomagnesemia  04/24/2014  . Hemiparesis affecting left side as late effect of cerebrovascular accident ([place]) 04/24/2014  . Nontraumatic cerebral hemorrhage ([place]) 04/30/2012  . DM (diabetes mellitus) with complications ([place]) 35/59/7416  . OSTEOPENIA 01/21/2009  . UNSPECIFIED VITAMIN D DEFICIENCY 11/19/2007  . ESSENTIAL HYPERTENSION, BENIGN 11/19/2007  . HYPERCHOLESTEROLEMIA 10/25/2006  . GASTROESOPHAGEAL REFLUX, NO ESOPHAGITIS 10/25/2006  . DIVERTICULOSIS OF COLON 10/25/2006  . Osteoarthritis 10/25/2006  . CERVICAL SPINE DISORDER, NOS 10/25/2006    Sumner Boast., PT 05/14/2019, 11:50 AM  [address] [place], [place], [address] Phone: [phone]   Fax:  [phone]  Name: TABATHIA KNOCHE MRN: [id number] Date of Birth: 1940/09/30

## 2019-05-15 ENCOUNTER — Ambulatory Visit (INDEPENDENT_AMBULATORY_CARE_PROVIDER_SITE_OTHER): Payer: Medicare Other | Admitting: Family Medicine

## 2019-05-15 ENCOUNTER — Other Ambulatory Visit: Payer: Self-pay

## 2019-05-15 ENCOUNTER — Encounter: Payer: Self-pay | Admitting: Family Medicine

## 2019-05-15 DIAGNOSIS — Z23 Encounter for immunization: Secondary | ICD-10-CM

## 2019-05-15 DIAGNOSIS — R0600 Dyspnea, unspecified: Secondary | ICD-10-CM

## 2019-05-15 DIAGNOSIS — I4891 Unspecified atrial fibrillation: Secondary | ICD-10-CM

## 2019-05-15 DIAGNOSIS — I5032 Chronic diastolic (congestive) heart failure: Secondary | ICD-10-CM | POA: Diagnosis not present

## 2019-05-15 DIAGNOSIS — E118 Type 2 diabetes mellitus with unspecified complications: Secondary | ICD-10-CM | POA: Diagnosis not present

## 2019-05-15 DIAGNOSIS — I480 Paroxysmal atrial fibrillation: Secondary | ICD-10-CM | POA: Diagnosis not present

## 2019-05-15 DIAGNOSIS — I69354 Hemiplegia and hemiparesis following cerebral infarction affecting left non-dominant side: Secondary | ICD-10-CM

## 2019-05-15 LAB — POCT GLYCOSYLATED HEMOGLOBIN (HGB A1C): HbA1c, POC (controlled diabetic range): 6.7 % (ref 0.0–7.0)

## 2019-05-15 MED ORDER — METOPROLOL SUCCINATE ER 50 MG PO TB24: 50.0000 mg | ORAL_TABLET | Freq: Every day | ORAL | 3 refills | Status: DC

## 2019-05-15 NOTE — Patient Instructions (Addendum)
Please have your eye doc send me a copy of your report - regarding diabetic eye screen. Everything else should stay the same. If your legs swell more, call me to increase the lasix. I will call tomorrow with both test results.   See me in 6 months. We will talk again about if you want to see a cardiologist.

## 2019-05-16 ENCOUNTER — Other Ambulatory Visit: Payer: Self-pay

## 2019-05-16 ENCOUNTER — Encounter: Payer: Self-pay | Admitting: Family Medicine

## 2019-05-16 ENCOUNTER — Encounter: Payer: Self-pay | Admitting: Physical Therapy

## 2019-05-16 ENCOUNTER — Ambulatory Visit: Payer: Medicare Other | Admitting: Physical Therapy

## 2019-05-16 DIAGNOSIS — I6990 Unspecified sequelae of unspecified cerebrovascular disease: Secondary | ICD-10-CM | POA: Diagnosis not present

## 2019-05-16 DIAGNOSIS — R262 Difficulty in walking, not elsewhere classified: Secondary | ICD-10-CM

## 2019-05-16 DIAGNOSIS — M25572 Pain in left ankle and joints of left foot: Secondary | ICD-10-CM

## 2019-05-16 DIAGNOSIS — I69354 Hemiplegia and hemiparesis following cerebral infarction affecting left non-dominant side: Secondary | ICD-10-CM

## 2019-05-16 DIAGNOSIS — I6930 Unspecified sequelae of cerebral infarction: Secondary | ICD-10-CM

## 2019-05-16 DIAGNOSIS — M6281 Muscle weakness (generalized): Secondary | ICD-10-CM

## 2019-05-16 DIAGNOSIS — M25562 Pain in left knee: Secondary | ICD-10-CM

## 2019-05-16 DIAGNOSIS — R2242 Localized swelling, mass and lump, left lower limb: Secondary | ICD-10-CM | POA: Diagnosis not present

## 2019-05-16 DIAGNOSIS — M25662 Stiffness of left knee, not elsewhere classified: Secondary | ICD-10-CM | POA: Diagnosis not present

## 2019-05-16 LAB — BRAIN NATRIURETIC PEPTIDE: BNP: 199 pg/mL — ABNORMAL HIGH (ref 0.0–100.0)

## 2019-05-16 NOTE — Progress Notes (Signed)
Established Patient Office Visit  Subjective:  Patient ID: Karen Jackson, female    DOB: November 11, 1940  Age: 78 y.o. MRN: [id number]  CC:  Chief Complaint  Patient presents with  . Annual Exam    HPI CHIMERE KLINGENSMITH presents for FU of several problems 1. Late effects of CVA.  Dense left hemiparesis.  Mostly wheelchair.  Needs periodic PT to maintain mobility.  Was a hemorrhatic stroke so no anticoag 2. A fib.  Old.  No recent palpitations.  On metoprolol for rate control.  No on anticoag because of old hemorrhagic cVA.   3. BP is on the lowish side, Only meds that effect BP are lasix (needs for diastolic dysfunction) and metoprolol (needs for a fib.)  Minimal orthostatic changes. 4. CHF.  Stable.  No dyspnea at rest.  Peripheral edema is controled by lasix.  Does have DOE with moderate exertion. 5. Episodes of pain in left chest - describes as under left breast.  Lasts for minutes.  Not associatied with diet or activity.   6. DM.  Diet controled by last check.  Needs a1C.  Remains diet controled. 7. Diabetic foor exam needed. 8. Chest congestion x 4 weeks.  Denies fever.  Cough with clear phlegm.    Past Medical History:  Diagnosis Date  . Acute cystitis without hematuria   . Acute diastolic CHF (congestive heart failure) ([place])   . Arthritis   . Dyspnea   . Dysrhythmia   . Fever of unknown origin 03/19/2017  . Hyperlipidemia   . Hypertension    denies at preop  . Multifocal pneumonia   . Neuromuscular disorder ([place])    neuropathy left arm and foot  . Osteopenia   . Paralysis ([place])    partial left side from CVA   . Persistent atrial fibrillation   . PONV (postoperative nausea and vomiting)   . Pre-diabetes   . Stroke [facility]) 2013   hemmorahgic    Past Surgical History:  Procedure Laterality Date  . ANKLE SURGERY    . APPENDECTOMY    . CHOLECYSTECTOMY    . HERNIA REPAIR     Esophagus  . JOINT REPLACEMENT     total- right partial- left  . MASTECTOMY PARTIAL / LUMPECTOMY   2012   left  . ORIF ANKLE FRACTURE Left 07/20/2018   Procedure: OPEN REDUCTION INTERNAL FIXATION (ORIF) ANKLE FRACTURE;  Surgeon: Wylene Simmer, MD;  Location: [place];  Service: Orthopedics;  Laterality: Left;  . TOTAL KNEE ARTHROPLASTY Left 01/27/2019   Procedure: TOTAL KNEE ARTHROPLASTY;  Surgeon: Vickey Huger, MD;  [facility];  Service: Orthopedics;  Laterality: Left;    Family History  Problem Relation Age of Onset  . Diabetes Mother     Social History   Socioeconomic History  . Marital status: Widowed    Spouse name: Not on file  . Number of children: 2  . Years of education: college  . Highest education level: Not on file  Occupational History  . Occupation: RetiredGames developer  Social Needs  . Financial resource strain: Not on file  . Food insecurity    Worry: Not on file    Inability: Not on file  . Transportation needs    Medical: Not on file    Non-medical: Not on file  Tobacco Use  . Smoking status: Former Smoker    Packs/day: 0.50    Years: 23.00    Pack years: 11.50    Types: Cigarettes    Start  date: 08/28/1957    Quit date: 03/09/1981    Years since quitting: 38.2  . Smokeless tobacco: Never Used  Substance and Sexual Activity  . Alcohol use: Yes    Alcohol/week: 15.0 standard drinks    Types: 1 Glasses of wine, 14 Standard drinks or equivalent per week    Comment: daily one glass  . Drug use: No  . Sexual activity: Not Currently  Lifestyle  . Physical activity    Days per week: Not on file    Minutes per session: Not on file  . Stress: Not on file  Relationships  . Social Herbalist on phone: Not on file    Gets together: Not on file    Attends religious service: Not on file    Active member of club or organization: Not on file    Attends meetings of clubs or organizations: Not on file    Relationship status: Not on file  . Intimate partner violence    Fear of current or ex partner: Not on file    Emotionally abused: Not on file     Physically abused: Not on file    Forced sexual activity: Not on file  Other Topics Concern  . Not on file  Social History Narrative   Health Care POA: son, Allen Kell   Emergency Contact: Lelon Frohlich and Roosvelt Harps, niece & nephew (c) [phone] (h) [phone]   End of Life Plan:    Who lives with you: self   Any pets: none   Diet: Pt has a varied diet of protein, vegetables and limits "white" foods.   Exercise: Pt does water aerobics 3x week, golf 2x week   Seatbelts: Pt reports wearing seatbelt when in vehicle.    Sun Exposure/Protection: Pt reports wearing sun screen   Hobbies: golfing, swimming           Outpatient Medications Prior to Visit  Medication Sig Dispense Refill  . atorvastatin (LIPITOR) 20 MG tablet TAKE 1 TABLET BY MOUTH EVERY DAY (Patient taking differently: Take 20 mg by mouth at bedtime. ) 90 tablet 3  . Calcium Carb-Cholecalciferol (CALCIUM CARBONATE-VITAMIN D3) 600-400 MG-UNIT TABS Take 1 tablet by mouth daily.    . celecoxib (CELEBREX) 200 MG capsule TK 1 C PO QD WF    . docusate sodium (COLACE) 100 MG capsule Take 1 capsule (100 mg total) by mouth 2 (two) times daily. While taking narcotic pain medicine. (Patient taking differently: Take 100 mg by mouth daily as needed for moderate constipation. While taking narcotic pain medicine.) 30 capsule 0  . furosemide (LASIX) 80 MG tablet TAKE 1 TABLET BY MOUTH EVERY DAY 90 tablet 3  . gabapentin (NEURONTIN) 100 MG capsule TAKE 1 CAPSULE BY MOUTH THREE TIMES DAILY (Patient taking differently: Take 100 mg by mouth 3 (three) times daily. ) 270 capsule 3  . MAGNESIUM-OXIDE 400 (241.3 Mg) MG tablet TAKE 1 TABLET BY MOUTH DAILY 90 tablet 3  . Multiple Vitamins-Minerals (HAIR/SKIN/NAILS) CAPS Take 3 capsules by mouth daily.    Marland Kitchen omeprazole (PRILOSEC) 20 MG capsule TAKE 1 CAPSULE BY MOUTH EVERY DAY 90 capsule 3  . senna (SENOKOT) 8.6 MG TABS tablet Take 2 tablets (17.2 mg total) by mouth 2 (two) times daily. (Patient taking differently:  Take 2 tablets by mouth daily as needed for moderate constipation. ) 30 each 0  . traZODone (DESYREL) 50 MG tablet Take 1 tablet (50 mg total) by mouth at bedtime. (Patient not taking: Reported on  05/08/2019) 90 tablet 3  . TURMERIC PO Take 1 capsule by mouth daily.    Marland Kitchen aspirin EC 325 MG EC tablet Take 1 tablet (325 mg total) by mouth 2 (two) times daily. (Patient not taking: Reported on 05/08/2019) 30 tablet 0  . cephALEXin (KEFLEX) 250 MG capsule Take 1 capsule (250 mg total) by mouth 4 (four) times daily. (Patient not taking: Reported on 05/08/2019) 28 capsule 0  . metoprolol tartrate (LOPRESSOR) 50 MG tablet Take 0.5 tablets (25 mg total) by mouth 2 (two) times daily. 180 tablet 3  . NARCAN 4 MG/0.1ML LIQD nasal spray kit NAR REP ALN    . ondansetron (ZOFRAN) 8 MG tablet TK 1 T PO Q 8 H PRN N FOR UP TO 7 DAYS    . oxyCODONE (OXY IR/ROXICODONE) 5 MG immediate release tablet Take 1-2 tablets (5-10 mg total) by mouth every 6 (six) hours as needed for moderate pain (pain score 4-6). 40 tablet 0  . polyethylene glycol (MIRALAX / GLYCOLAX) packet Take 17 g by mouth daily. (Patient taking differently: Take 17 g by mouth daily as needed for severe constipation. ) 30 each 1  . tiZANidine (ZANAFLEX) 2 MG tablet Take 1 tablet (2 mg total) by mouth every 6 (six) hours as needed. (Patient not taking: Reported on 05/08/2019) 50 tablet 0   No facility-administered medications prior to visit.     Allergies  Allergen Reactions  . Codeine Phosphate Nausea And Vomiting  . Simvastatin Other (See Comments)    Myalgia  Changed brand ok now    ROS Review of Systems    Objective:    Physical Exam  BP 108/78   Pulse 78   Wt 174 lb 9.6 oz (79.2 kg)   SpO2 93%   BMI 35.26 kg/m  Wt Readings from Last 3 Encounters:  05/15/19 174 lb 9.6 oz (79.2 kg)  03/24/19 170 lb (77.1 kg)  01/27/19 178 lb (80.7 kg)  Lungs clear.  No rales Cardiac RRR without m or g Abd benign Ext 1+ edema left leg. Diabetic foot  exam done.   Health Maintenance Due  Topic Date Due  . OPHTHALMOLOGY EXAM  10/10/2018  . TETANUS/TDAP  02/25/2019    There are no preventive care reminders to display for this patient.  Lab Results  Component Value Date   TSH 0.902 05/23/2018   Lab Results  Component Value Date   WBC 7.3 01/29/2019   HGB 11.8 (L) 01/29/2019   HCT 36.6 01/29/2019   MCV 101.9 (H) 01/29/2019   PLT 238 01/29/2019   Lab Results  Component Value Date   NA 135 01/28/2019   K 4.5 01/28/2019   CO2 28 01/28/2019   GLUCOSE 168 (H) 01/28/2019   BUN 13 01/28/2019   CREATININE 0.50 01/28/2019   BILITOT 0.7 01/23/2019   ALKPHOS 66 01/23/2019   AST 24 01/23/2019   ALT 15 01/23/2019   PROT 7.3 01/23/2019   ALBUMIN 3.9 01/23/2019   CALCIUM 8.6 (L) 01/28/2019   ANIONGAP 9 01/28/2019   Lab Results  Component Value Date   CHOL 219 (H) 05/23/2018   Lab Results  Component Value Date   HDL 87 05/23/2018   Lab Results  Component Value Date   LDLCALC 84 05/23/2018   Lab Results  Component Value Date   TRIG 240 (H) 05/23/2018   Lab Results  Component Value Date   CHOLHDL 2.5 05/23/2018   Lab Results  Component Value Date   HGBA1C 6.7 05/15/2019  Assessment & Plan:   Problem List Items Addressed This Visit    Atrial fibrillation with RVR (HCC)   Relevant Medications   metoprolol succinate (TOPROL-XL) 50 MG 24 hr tablet   DM (diabetes mellitus) with complications (HCC)   Relevant Orders   POCT glycosylated hemoglobin (Hb A1C) (Completed)   Dyspnea   Relevant Orders   Brain natriuretic peptide    Other Visit Diagnoses    Need for immunization against influenza       Relevant Orders   Flu Vaccine QUAD 36+ mos IM (Completed)      Meds ordered this encounter  Medications  . metoprolol succinate (TOPROL-XL) 50 MG 24 hr tablet    Sig: Take 1 tablet (50 mg total) by mouth at bedtime. Take with or immediately following a meal.    Dispense:  90 tablet    Refill:  3     Follow-up: No follow-ups on file.    Zenia Resides, MD

## 2019-05-16 NOTE — Assessment & Plan Note (Addendum)
Seems to be euvolemic.  My only concern is the chest congestion.  Check bNP and adjust lasix dose based on results.  BNP elevated.  Called patient.  Still congested.  Will try bid lasix for 3 days to see if it improves congestion.

## 2019-05-16 NOTE — Therapy (Signed)
[facility] [address] [place], [place], [address] Phone: [phone]   Fax:  [phone]  Physical Therapy Treatment  Patient Details  Name: Karen Jackson MRN: [id number] Date of Birth: 08/24/1941 Referring Provider (PT): Lucey   Encounter Date: 05/16/2019  PT End of Session - 05/16/19 1141    Visit Number  28    Date for PT Re-Evaluation  06/06/19    PT Start Time  1055    PT Stop Time  1138    PT Time Calculation (min)  43 min    Activity Tolerance  Patient limited by fatigue;Patient limited by pain    Behavior During Therapy  [facility] for tasks assessed/performed       Past Medical History:  Diagnosis Date  . Acute cystitis without hematuria   . Acute diastolic CHF (congestive heart failure) ([place])   . Arthritis   . Dyspnea   . Dysrhythmia   . Fever of unknown origin 03/19/2017  . Hyperlipidemia   . Hypertension    denies at preop  . Multifocal pneumonia   . Neuromuscular disorder ([place])    neuropathy left arm and foot  . Osteopenia   . Paralysis ([place])    partial left side from CVA   . Persistent atrial fibrillation   . PONV (postoperative nausea and vomiting)   . Pre-diabetes   . Stroke [facility]) 2013   hemmorahgic    Past Surgical History:  Procedure Laterality Date  . ANKLE SURGERY    . APPENDECTOMY    . CHOLECYSTECTOMY    . HERNIA REPAIR     Esophagus  . JOINT REPLACEMENT     total- right partial- left  . MASTECTOMY PARTIAL / LUMPECTOMY  2012   left  . ORIF ANKLE FRACTURE Left 07/20/2018   Procedure: OPEN REDUCTION INTERNAL FIXATION (ORIF) ANKLE FRACTURE;  Surgeon: Wylene Simmer, MD;  Location: [place];  Service: Orthopedics;  Laterality: Left;  . TOTAL KNEE ARTHROPLASTY Left 01/27/2019   Procedure: TOTAL KNEE ARTHROPLASTY;  Surgeon: Vickey Huger, MD;  [facility];  Service: Orthopedics;  Laterality: Left;    There were no vitals filed for this visit.  Subjective Assessment - 05/16/19 1116     Subjective  Patient reports that she is doing well today, toe is still sore         [facility] PT Assessment - 05/16/19 0001      Standardized Balance Assessment   Standardized Balance Assessment  Timed Up and Go Test      Timed Up and Go Test   Normal TUG (seconds)  159    TUG Comments  very slow and difficult to stand, used the FWW                   [facility] Adult PT Treatment/Exercise - 05/16/19 0001      Ambulation/Gait   Gait Comments  with FWW, only able to do 20' x 2 today, had c/o left ankle pain      Knee/Hip Exercises: Aerobic   Nustep  level 5 x 7 minutes'      Knee/Hip Exercises: Seated   Long Arc Quad  Strengthening;Left;10 reps;3 sets    [employer] Weight  5 lbs.    Other Seated Knee/Hip Exercises  ankle green tband exercises    Other Seated Knee/Hip Exercises  push with hip and knee x 15, bosu behind while she hel a 3# weight 2x10 partial sit ups  PT Short Term Goals - 03/20/19 1140      PT SHORT TERM GOAL #1   Title  independent with initial HEP    Status  Achieved        PT Long Term Goals - 05/16/19 1144      Additional Long Term Goals   Additional Long Term Goals  Yes      PT LONG TERM GOAL #6   Title  decrease TUG time to 45 seconds    Time  12    Period  Weeks    Status  New            Plan - 05/16/19 1141    Clinical Impression Statement  At evaluation patient was not doing well enough to perform a TUG test, so I tried to perform this today, she really struggled to stand taking greater than 30 seconds just to stand up, she was very slow, turned around before the 10' distance and the time was close to 3 minutes, she really struggled today with the walking as well having c/o left ankle pain, only able to do about 20 feet, she needed a lot of cues to pick up the left foot, straighten the knees and the back.    PT Next Visit Plan  will need to get her walking and transferring on her own    Consulted and  Agree with Plan of Care  Patient       Patient will benefit from skilled therapeutic intervention in order to improve the following deficits and impairments:  Abnormal gait, Decreased coordination, Decreased range of motion, Difficulty walking, Cardiopulmonary status limiting activity, Decreased endurance, Increased muscle spasms, Impaired UE functional use, Decreased activity tolerance, Pain, Decreased balance, Postural dysfunction, Decreased strength, Decreased mobility  Visit Diagnosis: Difficulty in walking, not elsewhere classified  Muscle weakness (generalized)  Late effects of CVA (cerebrovascular accident)  Stiffness of left knee, not elsewhere classified  Acute pain of left knee  Hemiplegia and hemiparesis following cerebral infarction affecting left non-dominant side (HCC)  Pain in left ankle and joints of left foot     Problem List Patient Active Problem List   Diagnosis Date Noted  . S/P total knee replacement 01/27/2019  . Recurrent left knee instability 07/05/2018  . Respiratory failure with hypoxia ([place]) 08/30/2017  . Hypoxemia   . Heart failure with preserved ejection fraction ([place]), Grade 3 diastolic dysfunction [id number]  . PAF (paroxysmal atrial fibrillation) ([place])   . Dyspnea 03/19/2017  . Encounter for preventive health examination 02/17/2016  . Sensorineural hearing loss (SNHL), bilateral 01/26/2016  . Morbid obesity ([place]) 06/17/2015  . Hypomagnesemia 04/24/2014  . Hemiparesis affecting left side as late effect of cerebrovascular accident ([place]) 04/24/2014  . Nontraumatic cerebral hemorrhage ([place]) 04/30/2012  . DM (diabetes mellitus) with complications ([place]) 0000000  . OSTEOPENIA 01/21/2009  . UNSPECIFIED VITAMIN D DEFICIENCY 11/19/2007  . ESSENTIAL HYPERTENSION, BENIGN 11/19/2007  . HYPERCHOLESTEROLEMIA 10/25/2006  . GASTROESOPHAGEAL REFLUX, NO ESOPHAGITIS 10/25/2006  . DIVERTICULOSIS OF COLON 10/25/2006  . Osteoarthritis 10/25/2006  .  CERVICAL SPINE DISORDER, NOS 10/25/2006    Sumner Boast., PT 05/16/2019, 11:46 AM  [address] [address] [place], [place], [address] Phone: [phone]   Fax:  [phone]  Name: Karen Jackson MRN: [id number] Date of Birth: 01-31-1941

## 2019-05-16 NOTE — Assessment & Plan Note (Signed)
Needs recurrent PT.  Fortunately, she has resources and is safe at home with an aide

## 2019-05-16 NOTE — Assessment & Plan Note (Signed)
Controled by diet.  No meds.  Foot exam done.  She will get eye exam and send me results.

## 2019-05-16 NOTE — Assessment & Plan Note (Signed)
Ordered BNP to help me determine if chest congestion is subtle worsening of CHF.

## 2019-05-16 NOTE — Assessment & Plan Note (Signed)
Seems in sinus.  Continue metop.  No anticoag due to hemorrhagic CVA.

## 2019-05-16 NOTE — Assessment & Plan Note (Signed)
Wt is stable.  Counseled on wt loss.  Very difficult with her limited activity.

## 2019-05-19 ENCOUNTER — Ambulatory Visit: Payer: Medicare Other | Admitting: Physical Therapy

## 2019-05-19 ENCOUNTER — Encounter: Payer: Self-pay | Admitting: Physical Therapy

## 2019-05-19 ENCOUNTER — Other Ambulatory Visit: Payer: Self-pay

## 2019-05-19 DIAGNOSIS — R262 Difficulty in walking, not elsewhere classified: Secondary | ICD-10-CM

## 2019-05-19 DIAGNOSIS — M25662 Stiffness of left knee, not elsewhere classified: Secondary | ICD-10-CM | POA: Diagnosis not present

## 2019-05-19 DIAGNOSIS — I69354 Hemiplegia and hemiparesis following cerebral infarction affecting left non-dominant side: Secondary | ICD-10-CM

## 2019-05-19 DIAGNOSIS — M25562 Pain in left knee: Secondary | ICD-10-CM | POA: Diagnosis not present

## 2019-05-19 DIAGNOSIS — M6281 Muscle weakness (generalized): Secondary | ICD-10-CM

## 2019-05-19 DIAGNOSIS — R2242 Localized swelling, mass and lump, left lower limb: Secondary | ICD-10-CM | POA: Diagnosis not present

## 2019-05-19 DIAGNOSIS — M25572 Pain in left ankle and joints of left foot: Secondary | ICD-10-CM

## 2019-05-19 DIAGNOSIS — I6990 Unspecified sequelae of unspecified cerebrovascular disease: Secondary | ICD-10-CM

## 2019-05-19 NOTE — Therapy (Signed)
[facility] [address] [place], [place], [address] Phone: [phone]   Fax:  [phone]  Physical Therapy Treatment  Patient Details  Name: Karen Jackson MRN: [id number] Date of Birth: Dec 21, 1940 Referring Provider (PT): Lucey   Encounter Date: 05/19/2019  PT End of Session - 05/19/19 1201    Visit Number  29    Date for PT Re-Evaluation  06/06/19    PT Start Time  1057    PT Stop Time  1140    PT Time Calculation (min)  43 min    Activity Tolerance  Patient tolerated treatment well    Behavior During Therapy  [facility] for tasks assessed/performed       Past Medical History:  Diagnosis Date  . Acute cystitis without hematuria   . Acute diastolic CHF (congestive heart failure) ([place])   . Arthritis   . Dyspnea   . Dysrhythmia   . Fever of unknown origin 03/19/2017  . Hyperlipidemia   . Hypertension    denies at preop  . Multifocal pneumonia   . Neuromuscular disorder ([employer])    neuropathy left arm and foot  . Osteopenia   . Paralysis ([place])    partial left side from CVA   . Persistent atrial fibrillation   . PONV (postoperative nausea and vomiting)   . Pre-diabetes   . Stroke [facility]) 2013   hemmorahgic    Past Surgical History:  Procedure Laterality Date  . ANKLE SURGERY    . APPENDECTOMY    . CHOLECYSTECTOMY    . HERNIA REPAIR     Esophagus  . JOINT REPLACEMENT     total- right partial- left  . MASTECTOMY PARTIAL / LUMPECTOMY  2012   left  . ORIF ANKLE FRACTURE Left 07/20/2018   Procedure: OPEN REDUCTION INTERNAL FIXATION (ORIF) ANKLE FRACTURE;  Surgeon: Wylene Simmer, MD;  Location: [place];  Service: Orthopedics;  Laterality: Left;  . TOTAL KNEE ARTHROPLASTY Left 01/27/2019   Procedure: TOTAL KNEE ARTHROPLASTY;  Surgeon: Vickey Huger, MD;  [facility];  Service: Orthopedics;  Laterality: Left;    There were no vitals filed for this visit.  Subjective Assessment - 05/19/19 1126    Subjective   Patient reports feeling better today    Currently in Pain?  No/denies                       [facility] Adult PT Treatment/Exercise - 05/19/19 0001      Ambulation/Gait   Gait Comments  gait with FWW, 40', 60' and 60', much better today      High Level Balance   High Level Balance Activities  Side stepping      Knee/Hip Exercises: Aerobic   Nustep  level 5 x 7 minutes'      Knee/Hip Exercises: Seated   Long Arc Quad  Strengthening;Left;10 reps;3 sets    [employer] Weight  5 lbs.    Other Seated Knee/Hip Exercises  ankle green tband exercises    Other Seated Knee/Hip Exercises  push with hip and knee x 15, bosu behind while she hel a 3# weight 2x10 partial sit ups    Marching  Strengthening;Left;2 sets;10 reps    Marching Weights  3 lbs.    Hamstring Curl  Strengthening;Both;2 sets;10 reps    Hamstring Limitations  blue tband      Manual Therapy   Manual Therapy  Passive ROM    Passive ROM  motion of the knee and then with pressure through the ankle                PT Short Term Goals - 03/20/19 1140      PT SHORT TERM GOAL #1   Title  independent with initial HEP    Status  Achieved        PT Long Term Goals - 05/16/19 1144      Additional Long Term Goals   Additional Long Term Goals  Yes      PT LONG TERM GOAL #6   Title  decrease TUG time to 45 seconds    Time  12    Period  Weeks    Status  New            Plan - 05/19/19 1202    Clinical Impression Statement  Patient much better today, she was able to stand better with less assistance, she was able to walk with the walker eith CGA and minimal cues.  She is unsure of why last Friday was so bad.  She does report that she will be put on lasix in the next week or so    PT Next Visit Plan  will need to get her walking and transferring on her own    Consulted and Agree with Plan of Care  Patient       Patient will benefit from skilled therapeutic intervention in order to improve the  following deficits and impairments:  Abnormal gait, Decreased coordination, Decreased range of motion, Difficulty walking, Cardiopulmonary status limiting activity, Decreased endurance, Increased muscle spasms, Impaired UE functional use, Decreased activity tolerance, Pain, Decreased balance, Postural dysfunction, Decreased strength, Decreased mobility  Visit Diagnosis: Difficulty in walking, not elsewhere classified  Muscle weakness (generalized)  Late effects of CVA (cerebrovascular accident)  Stiffness of left knee, not elsewhere classified  Acute pain of left knee  Pain in left ankle and joints of left foot  Hemiplegia and hemiparesis following cerebral infarction affecting left non-dominant side [facility])     Problem List Patient Active Problem List   Diagnosis Date Noted  . S/P total knee replacement 01/27/2019  . Recurrent left knee instability 07/05/2018  . Respiratory failure with hypoxia ([place]) 08/30/2017  . Hypoxemia   . Heart failure with preserved ejection fraction ([place]), Grade 3 diastolic dysfunction [id number]  . PAF (paroxysmal atrial fibrillation) ([place])   . Dyspnea 03/19/2017  . Encounter for preventive health examination 02/17/2016  . Sensorineural hearing loss (SNHL), bilateral 01/26/2016  . Morbid obesity ([place]) 06/17/2015  . Hypomagnesemia 04/24/2014  . Hemiparesis affecting left side as late effect of cerebrovascular accident ([place]) 04/24/2014  . Nontraumatic cerebral hemorrhage ([place]) 04/30/2012  . DM (diabetes mellitus) with complications ([place]) 0000000  . OSTEOPENIA 01/21/2009  . UNSPECIFIED VITAMIN D DEFICIENCY 11/19/2007  . ESSENTIAL HYPERTENSION, BENIGN 11/19/2007  . HYPERCHOLESTEROLEMIA 10/25/2006  . GASTROESOPHAGEAL REFLUX, NO ESOPHAGITIS 10/25/2006  . DIVERTICULOSIS OF COLON 10/25/2006  . Osteoarthritis 10/25/2006  . CERVICAL SPINE DISORDER, NOS 10/25/2006    Sumner Boast., PT 05/19/2019, 12:04 PM  [facility] [address] [place], [place], [address] Phone: [phone]   Fax:  [phone]  Name: GHINA MEHRHOFF MRN: [id number] Date of Birth: [birth year]

## 2019-05-21 ENCOUNTER — Other Ambulatory Visit: Payer: Self-pay

## 2019-05-21 ENCOUNTER — Ambulatory Visit: Payer: Medicare Other | Admitting: Physical Therapy

## 2019-05-21 ENCOUNTER — Encounter: Payer: Self-pay | Admitting: Physical Therapy

## 2019-05-21 DIAGNOSIS — R262 Difficulty in walking, not elsewhere classified: Secondary | ICD-10-CM | POA: Diagnosis not present

## 2019-05-21 DIAGNOSIS — M25562 Pain in left knee: Secondary | ICD-10-CM | POA: Diagnosis not present

## 2019-05-21 DIAGNOSIS — I6990 Unspecified sequelae of unspecified cerebrovascular disease: Secondary | ICD-10-CM | POA: Diagnosis not present

## 2019-05-21 DIAGNOSIS — R2242 Localized swelling, mass and lump, left lower limb: Secondary | ICD-10-CM | POA: Diagnosis not present

## 2019-05-21 DIAGNOSIS — M6281 Muscle weakness (generalized): Secondary | ICD-10-CM

## 2019-05-21 DIAGNOSIS — M25572 Pain in left ankle and joints of left foot: Secondary | ICD-10-CM

## 2019-05-21 DIAGNOSIS — M25662 Stiffness of left knee, not elsewhere classified: Secondary | ICD-10-CM

## 2019-05-21 NOTE — Therapy (Signed)
Karen Jackson Karen Jackson, [place], [address] Phone: [phone]   Fax:  [phone] Progress Note Reporting Period 04/25/19 to 05/21/19 for visits 21-30  See note below for Objective Data and Assessment of Progress/Goals.      Physical Therapy Treatment  Patient Details  Name: Karen Jackson MRN: [id number] Date of Birth: 1941-08-01 Referring Provider (PT): Lucey   Encounter Date: 05/21/2019  PT End of Session - 05/21/19 1144    Visit Number  30    Date for PT Re-Evaluation  06/06/19    PT Start Time  1056    PT Stop Time  1139    PT Time Calculation (min)  43 min    Activity Tolerance  Patient tolerated treatment well    Behavior During Therapy  [facility] for tasks assessed/performed       Past Medical History:  Diagnosis Date  . Acute cystitis without hematuria   . Acute diastolic CHF (congestive heart failure) ([place])   . Arthritis   . Dyspnea   . Dysrhythmia   . Fever of unknown origin 03/19/2017  . Hyperlipidemia   . Hypertension    denies at preop  . Multifocal pneumonia   . Neuromuscular disorder ([place])    neuropathy left arm and foot  . Osteopenia   . Paralysis ([place])    partial left side from CVA   . Persistent atrial fibrillation   . PONV (postoperative nausea and vomiting)   . Pre-diabetes   . Stroke [facility]) 2013   hemmorahgic    Past Surgical History:  Procedure Laterality Date  . ANKLE SURGERY    . APPENDECTOMY    . CHOLECYSTECTOMY    . HERNIA REPAIR     Esophagus  . JOINT REPLACEMENT     total- right partial- left  . MASTECTOMY PARTIAL / LUMPECTOMY  2012   left  . ORIF ANKLE FRACTURE Left 07/20/2018   Procedure: OPEN REDUCTION INTERNAL FIXATION (ORIF) ANKLE FRACTURE;  Surgeon: Wylene Simmer, MD;  Location: [place];  Service: Orthopedics;  Laterality: Left;  . TOTAL KNEE ARTHROPLASTY Left 01/27/2019   Procedure: TOTAL KNEE ARTHROPLASTY;  Surgeon: Vickey Huger, MD;  [facility];   Service: Orthopedics;  Laterality: Left;    There were no vitals filed for this visit.  Subjective Assessment - 05/21/19 1103    Subjective  I am doing well today    Currently in Pain?  No/denies         [facility] PT Assessment - 05/21/19 0001      Timed Up and Go Test   TUG Comments  tried the TUG again, again she could not do it, took her 1 minute to get up                   [facility] Adult PT Treatment/Exercise - 05/21/19 0001      Ambulation/Gait   Gait Comments  use of FWW 40' 60' and 63' with CGA      Knee/Hip Exercises: Aerobic   Nustep  level 6 x 7 minutes'      Knee/Hip Exercises: Seated   Ball Squeeze  30    Other Seated Knee/Hip Exercises  ankle green tband exercises    Other Seated Knee/Hip Exercises  push with hip and knee x 15, bosu behind while she hel a 3# weight 2x10 partial sit ups      Manual Therapy   Manual Therapy  Passive ROM  Passive ROM  motion of the knee and then with pressure through the ankle                PT Short Term Goals - 03/20/19 1140      PT SHORT TERM GOAL #1   Title  independent with initial HEP    Status  Achieved        PT Long Term Goals - 05/16/19 1144      Additional Long Term Goals   Additional Long Term Goals  Yes      PT LONG TERM GOAL #6   Title  decrease TUG time to 45 seconds    Time  12    Period  Weeks    Status  New            Plan - 05/21/19 1144    Clinical Impression Statement  I have noticed the last few visits that she is allowing the left leg to turn out at the hip, I have had to give more cues to have her engage the mms to correct.  We again tried the TUG, it took her over a minute to stand up, I am thinking the issue is that she cannot push up from the seat height, I may have her attempt again this time from the wheelchair or from a chair with arm rests    PT Next Visit Plan  my try TUG from w/c or chair with arm rests    Consulted and Agree with Plan of Care  Patient        Patient will benefit from skilled therapeutic intervention in order to improve the following deficits and impairments:  Abnormal gait, Decreased coordination, Decreased range of motion, Difficulty walking, Cardiopulmonary status limiting activity, Decreased endurance, Increased muscle spasms, Impaired UE functional use, Decreased activity tolerance, Pain, Decreased balance, Postural dysfunction, Decreased strength, Decreased mobility  Visit Diagnosis: Difficulty in walking, not elsewhere classified  Muscle weakness (generalized)  Late effects of CVA (cerebrovascular accident)  Stiffness of left knee, not elsewhere classified  Acute pain of left knee  Pain in left ankle and joints of left foot     Problem List Patient Active Problem List   Diagnosis Date Noted  . S/P total knee replacement 01/27/2019  . Recurrent left knee instability 07/05/2018  . Respiratory failure with hypoxia ([place]) 08/30/2017  . Hypoxemia   . Heart failure with preserved ejection fraction ([place]), Grade 3 diastolic dysfunction [id number]  . PAF (paroxysmal atrial fibrillation) ([place])   . Dyspnea 03/19/2017  . Encounter for preventive health examination 02/17/2016  . Sensorineural hearing loss (SNHL), bilateral 01/26/2016  . Morbid obesity ([place]) 06/17/2015  . Hypomagnesemia 04/24/2014  . Hemiparesis affecting left side as late effect of cerebrovascular accident ([place]) 04/24/2014  . Nontraumatic cerebral hemorrhage ([place]) 04/30/2012  . DM (diabetes mellitus) with complications ([place]) 0000000  . OSTEOPENIA 01/21/2009  . UNSPECIFIED VITAMIN D DEFICIENCY 11/19/2007  . ESSENTIAL HYPERTENSION, BENIGN 11/19/2007  . HYPERCHOLESTEROLEMIA 10/25/2006  . GASTROESOPHAGEAL REFLUX, NO ESOPHAGITIS 10/25/2006  . DIVERTICULOSIS OF COLON 10/25/2006  . Osteoarthritis 10/25/2006  . CERVICAL SPINE DISORDER, NOS 10/25/2006    Sumner Boast., PT 05/21/2019, 11:48 AM  [address] [place], [place], [address] Phone: [phone]   Fax:  [phone]  Name: Karen Jackson MRN: [id number] Date of Birth: [birth year]

## 2019-05-23 ENCOUNTER — Other Ambulatory Visit: Payer: Self-pay

## 2019-05-23 ENCOUNTER — Encounter: Payer: Self-pay | Admitting: Physical Therapy

## 2019-05-23 ENCOUNTER — Ambulatory Visit: Payer: Medicare Other | Admitting: Physical Therapy

## 2019-05-23 DIAGNOSIS — M6281 Muscle weakness (generalized): Secondary | ICD-10-CM

## 2019-05-23 DIAGNOSIS — M25662 Stiffness of left knee, not elsewhere classified: Secondary | ICD-10-CM

## 2019-05-23 DIAGNOSIS — I6990 Unspecified sequelae of unspecified cerebrovascular disease: Secondary | ICD-10-CM

## 2019-05-23 DIAGNOSIS — R262 Difficulty in walking, not elsewhere classified: Secondary | ICD-10-CM | POA: Diagnosis not present

## 2019-05-23 DIAGNOSIS — M25572 Pain in left ankle and joints of left foot: Secondary | ICD-10-CM

## 2019-05-23 DIAGNOSIS — M25562 Pain in left knee: Secondary | ICD-10-CM | POA: Diagnosis not present

## 2019-05-23 DIAGNOSIS — R2242 Localized swelling, mass and lump, left lower limb: Secondary | ICD-10-CM | POA: Diagnosis not present

## 2019-05-23 DIAGNOSIS — I69354 Hemiplegia and hemiparesis following cerebral infarction affecting left non-dominant side: Secondary | ICD-10-CM

## 2019-05-23 NOTE — Therapy (Signed)
[place] [address] [place], [place], [address] Phone: [phone]   Fax:  [phone]  Physical Therapy Treatment  Patient Details  Name: Karen Jackson MRN: [id number] Date of Birth: 1941-01-24 Referring Provider (PT): Lucey   Encounter Date: 05/23/2019  PT End of Session - 05/23/19 1148    Visit Number  31    Date for PT Re-Evaluation  06/06/19    PT Start Time  1058    PT Stop Time  1144    PT Time Calculation (min)  46 min    Activity Tolerance  Patient tolerated treatment well    Behavior During Therapy  [facility] for tasks assessed/performed       Past Medical History:  Diagnosis Date  . Acute cystitis without hematuria   . Acute diastolic CHF (congestive heart failure) ([place])   . Arthritis   . Dyspnea   . Dysrhythmia   . Fever of unknown origin 03/19/2017  . Hyperlipidemia   . Hypertension    denies at preop  . Multifocal pneumonia   . Neuromuscular disorder ([place])    neuropathy left arm and foot  . Osteopenia   . Paralysis ([place])    partial left side from CVA   . Persistent atrial fibrillation   . PONV (postoperative nausea and vomiting)   . Pre-diabetes   . Stroke [facility] 2013   hemmorahgic    Past Surgical History:  Procedure Laterality Date  . ANKLE SURGERY    . APPENDECTOMY    . CHOLECYSTECTOMY    . HERNIA REPAIR     Esophagus  . JOINT REPLACEMENT     total- right partial- left  . MASTECTOMY PARTIAL / LUMPECTOMY  2012   left  . ORIF ANKLE FRACTURE Left 07/20/2018   Procedure: OPEN REDUCTION INTERNAL FIXATION (ORIF) ANKLE FRACTURE;  Surgeon: Wylene Simmer, MD;  Location: [place];  Service: Orthopedics;  Laterality: Left;  . TOTAL KNEE ARTHROPLASTY Left 01/27/2019   Procedure: TOTAL KNEE ARTHROPLASTY;  Surgeon: Vickey Huger, MD;  [facility];  Service: Orthopedics;  Laterality: Left;    There were no vitals filed for this visit.  Subjective Assessment - 05/23/19 1112    Subjective   Patient reports that she is feeling okay, but hte wet weather really has her sore and stiff today    Currently in Pain?  No/denies         [facility] PT Assessment - 05/23/19 0001      Timed Up and Go Test   Normal TUG (seconds)  85                   [facility] Adult PT Treatment/Exercise - 05/23/19 0001      Ambulation/Gait   Gait Comments  gait with FWW, CGA 75' x 2      High Level Balance   High Level Balance Activities  Side stepping      Knee/Hip Exercises: Aerobic   Nustep  level 6 x 7 minutes'      Knee/Hip Exercises: Seated   Ball Squeeze  used red tband for left hip adduction this way I knew that the left leg was working.    Other Seated Knee/Hip Exercises  ankle green tband exercises    Hamstring Curl  Strengthening;Both;2 sets;10 reps    Hamstring Limitations  blue tband      Manual Therapy   Manual Therapy  Passive ROM    Passive ROM  motion of  the knee and then with pressure through the ankle                PT Short Term Goals - 03/20/19 1140      PT SHORT TERM GOAL #1   Title  independent with initial HEP    Status  Achieved        PT Long Term Goals - 05/23/19 1150      PT LONG TERM GOAL #6   Title  decrease TUG time to 45 seconds    Status  On-going            Plan - 05/23/19 1149    Clinical Impression Statement  Tried some new ways to assure that the left hip adductors were working and showed the caregiver how to do.  She was able to do the TUG today and did well, she alos was able to walk better    PT Next Visit Plan  will continue to work on her function to help with quality of life    Consulted and Agree with Plan of Care  Patient       Patient will benefit from skilled therapeutic intervention in order to improve the following deficits and impairments:  Abnormal gait, Decreased coordination, Decreased range of motion, Difficulty walking, Cardiopulmonary status limiting activity, Decreased endurance, Increased muscle  spasms, Impaired UE functional use, Decreased activity tolerance, Pain, Decreased balance, Postural dysfunction, Decreased strength, Decreased mobility  Visit Diagnosis: Difficulty in walking, not elsewhere classified  Muscle weakness (generalized)  Late effects of CVA (cerebrovascular accident)  Stiffness of left knee, not elsewhere classified  Pain in left ankle and joints of left foot  Acute pain of left knee  Hemiplegia and hemiparesis following cerebral infarction affecting left non-dominant side [facility])     Problem List Patient Active Problem List   Diagnosis Date Noted  . S/P total knee replacement 01/27/2019  . Recurrent left knee instability 07/05/2018  . Respiratory failure with hypoxia ([place]) 08/30/2017  . Hypoxemia   . Heart failure with preserved ejection fraction ([place]), Grade 3 diastolic dysfunction [id number]  . PAF (paroxysmal atrial fibrillation) ([place])   . Dyspnea 03/19/2017  . Encounter for preventive health examination 02/17/2016  . Sensorineural hearing loss (SNHL), bilateral 01/26/2016  . Morbid obesity ([place]) 06/17/2015  . Hypomagnesemia 04/24/2014  . Hemiparesis affecting left side as late effect of cerebrovascular accident ([place]) 04/24/2014  . Nontraumatic cerebral hemorrhage ([place]) 04/30/2012  . DM (diabetes mellitus) with complications ([place]) 0000000  . OSTEOPENIA 01/21/2009  . UNSPECIFIED VITAMIN D DEFICIENCY 11/19/2007  . ESSENTIAL HYPERTENSION, BENIGN 11/19/2007  . HYPERCHOLESTEROLEMIA 10/25/2006  . GASTROESOPHAGEAL REFLUX, NO ESOPHAGITIS 10/25/2006  . DIVERTICULOSIS OF COLON 10/25/2006  . Osteoarthritis 10/25/2006  . CERVICAL SPINE DISORDER, NOS 10/25/2006    Sumner Boast., PT 05/23/2019, 11:51 AM  [address] [place], [place], [address] Phone: [phone]   Fax:  [phone]  Name: Karen Jackson MRN: [id number] Date of Birth: [birth year]

## 2019-05-26 ENCOUNTER — Encounter: Payer: Self-pay | Admitting: Physical Therapy

## 2019-05-26 ENCOUNTER — Ambulatory Visit: Payer: Medicare Other | Admitting: Physical Therapy

## 2019-05-26 ENCOUNTER — Other Ambulatory Visit: Payer: Self-pay

## 2019-05-26 DIAGNOSIS — R262 Difficulty in walking, not elsewhere classified: Secondary | ICD-10-CM | POA: Diagnosis not present

## 2019-05-26 DIAGNOSIS — R2242 Localized swelling, mass and lump, left lower limb: Secondary | ICD-10-CM | POA: Diagnosis not present

## 2019-05-26 DIAGNOSIS — M25662 Stiffness of left knee, not elsewhere classified: Secondary | ICD-10-CM | POA: Diagnosis not present

## 2019-05-26 DIAGNOSIS — M25562 Pain in left knee: Secondary | ICD-10-CM | POA: Diagnosis not present

## 2019-05-26 DIAGNOSIS — I6990 Unspecified sequelae of unspecified cerebrovascular disease: Secondary | ICD-10-CM | POA: Diagnosis not present

## 2019-05-26 DIAGNOSIS — M25572 Pain in left ankle and joints of left foot: Secondary | ICD-10-CM

## 2019-05-26 DIAGNOSIS — M6281 Muscle weakness (generalized): Secondary | ICD-10-CM

## 2019-05-26 DIAGNOSIS — I6930 Unspecified sequelae of cerebral infarction: Secondary | ICD-10-CM

## 2019-05-26 NOTE — Therapy (Signed)
[address] [place], [place], [address] Phone: [phone]   Fax:  [phone]  Physical Therapy Treatment  Patient Details  Name: Karen Jackson MRN: [id number] Date of Birth: August 03, 1941 Referring Provider (PT): Lucey   Encounter Date: 05/26/2019  PT End of Session - 05/26/19 1144    Visit Number  32    Date for PT Re-Evaluation  06/06/19    PT Start Time  1057    PT Stop Time  1143    PT Time Calculation (min)  46 min    Activity Tolerance  Patient tolerated treatment well    Behavior During Therapy  [facility] for tasks assessed/performed       Past Medical History:  Diagnosis Date  . Acute cystitis without hematuria   . Acute diastolic CHF (congestive heart failure) ([place])   . Arthritis   . Dyspnea   . Dysrhythmia   . Fever of unknown origin 03/19/2017  . Hyperlipidemia   . Hypertension    denies at preop  . Multifocal pneumonia   . Neuromuscular disorder ([place])    neuropathy left arm and foot  . Osteopenia   . Paralysis ([place])    partial left side from CVA   . Persistent atrial fibrillation   . PONV (postoperative nausea and vomiting)   . Pre-diabetes   . Stroke [facility]) 2013   hemmorahgic    Past Surgical History:  Procedure Laterality Date  . ANKLE SURGERY    . APPENDECTOMY    . CHOLECYSTECTOMY    . HERNIA REPAIR     Esophagus  . JOINT REPLACEMENT     total- right partial- left  . MASTECTOMY PARTIAL / LUMPECTOMY  2012   left  . ORIF ANKLE FRACTURE Left 07/20/2018   Procedure: OPEN REDUCTION INTERNAL FIXATION (ORIF) ANKLE FRACTURE;  Surgeon: Wylene Simmer, MD;  Location: [facility];  Service: Orthopedics;  Laterality: Left;  . TOTAL KNEE ARTHROPLASTY Left 01/27/2019   Procedure: TOTAL KNEE ARTHROPLASTY;  Surgeon: Vickey Huger, MD;  [facility];  Service: Orthopedics;  Laterality: Left;    There were no vitals filed for this visit.  Subjective Assessment - 05/26/19 1108    Subjective   Patient continues to c/o soreness and stiffness saying it is the weather    Currently in Pain?  No/denies                       [facility] Adult PT Treatment/Exercise - 05/26/19 0001      Ambulation/Gait   Gait Comments  gait with FWW, CGA 60' and then 75' x 2, fatigue is what stops her now      High Level Balance   High Level Balance Activities  Side stepping    High Level Balance Comments  standing with walker, ball tap backs with one person gaurding patient, seated ball tap back with challenging her core and balance.  Standing 4" toe touches      Knee/Hip Exercises: Aerobic   Nustep  level 6 x 7 minutes'      Knee/Hip Exercises: Seated   Ball Squeeze  used red tband for left hip adduction this way I knew that the left leg was working.    Other Seated Knee/Hip Exercises  ankle green tband exercises    Hamstring Curl  Strengthening;Both;2 sets;10 reps    Hamstring Limitations  blue tband  PT Short Term Goals - 03/20/19 1140      PT SHORT TERM GOAL #1   Title  independent with initial HEP    Status  Achieved        PT Long Term Goals - 05/23/19 1150      PT LONG TERM GOAL #6   Title  decrease TUG time to 45 seconds    Status  On-going            Plan - 05/26/19 1144    Clinical Impression Statement  Patient limited today with walking by shortness of breath, she still needs cues to do transfers correctly as to not put stress on the knee.  She does have issues getting up from sitting requiring assist    PT Next Visit Plan  continue to push her function    Consulted and Agree with Plan of Care  Patient       Patient will benefit from skilled therapeutic intervention in order to improve the following deficits and impairments:  Abnormal gait, Decreased coordination, Decreased range of motion, Difficulty walking, Cardiopulmonary status limiting activity, Decreased endurance, Increased muscle spasms, Impaired UE functional use, Decreased  activity tolerance, Pain, Decreased balance, Postural dysfunction, Decreased strength, Decreased mobility  Visit Diagnosis: Difficulty in walking, not elsewhere classified  Muscle weakness (generalized)  Late effects of CVA (cerebrovascular accident)  Stiffness of left knee, not elsewhere classified  Pain in left ankle and joints of left foot  Acute pain of left knee     Problem List Patient Active Problem List   Diagnosis Date Noted  . S/P total knee replacement 01/27/2019  . Recurrent left knee instability 07/05/2018  . Respiratory failure with hypoxia ([place]) 08/30/2017  . Hypoxemia   . Heart failure with preserved ejection fraction ([place]), Grade 3 diastolic dysfunction [id number]  . PAF (paroxysmal atrial fibrillation) ([place])   . Dyspnea 03/19/2017  . Encounter for preventive health examination 02/17/2016  . Sensorineural hearing loss (SNHL), bilateral 01/26/2016  . Morbid obesity ([place]) 06/17/2015  . Hypomagnesemia 04/24/2014  . Hemiparesis affecting left side as late effect of cerebrovascular accident ([place]) 04/24/2014  . Nontraumatic cerebral hemorrhage ([place]) 04/30/2012  . DM (diabetes mellitus) with complications ([place]) 0000000  . OSTEOPENIA 01/21/2009  . UNSPECIFIED VITAMIN D DEFICIENCY 11/19/2007  . ESSENTIAL HYPERTENSION, BENIGN 11/19/2007  . HYPERCHOLESTEROLEMIA 10/25/2006  . GASTROESOPHAGEAL REFLUX, NO ESOPHAGITIS 10/25/2006  . DIVERTICULOSIS OF COLON 10/25/2006  . Osteoarthritis 10/25/2006  . CERVICAL SPINE DISORDER, NOS 10/25/2006    Sumner Boast., PT 05/26/2019, 11:48 AM  [address] [place], [place], [address] Phone: [phone]   Fax:  [phone]  Name: Karen Jackson MRN: [id number] Date of Birth: [birth year]

## 2019-05-28 ENCOUNTER — Ambulatory Visit: Payer: Medicare Other | Admitting: Physical Therapy

## 2019-05-28 ENCOUNTER — Other Ambulatory Visit: Payer: Self-pay

## 2019-05-28 ENCOUNTER — Encounter: Payer: Self-pay | Admitting: Physical Therapy

## 2019-05-28 DIAGNOSIS — R262 Difficulty in walking, not elsewhere classified: Secondary | ICD-10-CM | POA: Diagnosis not present

## 2019-05-28 DIAGNOSIS — R2242 Localized swelling, mass and lump, left lower limb: Secondary | ICD-10-CM | POA: Diagnosis not present

## 2019-05-28 DIAGNOSIS — M25662 Stiffness of left knee, not elsewhere classified: Secondary | ICD-10-CM

## 2019-05-28 DIAGNOSIS — I69354 Hemiplegia and hemiparesis following cerebral infarction affecting left non-dominant side: Secondary | ICD-10-CM

## 2019-05-28 DIAGNOSIS — I6990 Unspecified sequelae of unspecified cerebrovascular disease: Secondary | ICD-10-CM | POA: Diagnosis not present

## 2019-05-28 DIAGNOSIS — M6281 Muscle weakness (generalized): Secondary | ICD-10-CM

## 2019-05-28 DIAGNOSIS — M25562 Pain in left knee: Secondary | ICD-10-CM | POA: Diagnosis not present

## 2019-05-28 DIAGNOSIS — M25572 Pain in left ankle and joints of left foot: Secondary | ICD-10-CM

## 2019-05-28 NOTE — Therapy (Signed)
[place] [address] [place], [place], [address] Phone: [phone]   Fax:  [phone]  Physical Therapy Treatment  Patient Details  Name: Karen Jackson MRN: [id number] Date of Birth: April 10, 1941 Referring Provider (PT): Lucey   Encounter Date: 05/28/2019  PT End of Session - 05/28/19 1143    Visit Number  33    Date for PT Re-Evaluation  06/06/19    PT Start Time  1055    PT Stop Time  1143    PT Time Calculation (min)  48 min    Activity Tolerance  Patient tolerated treatment well    Behavior During Therapy  [facility] for tasks assessed/performed       Past Medical History:  Diagnosis Date  . Acute cystitis without hematuria   . Acute diastolic CHF (congestive heart failure) ([place])   . Arthritis   . Dyspnea   . Dysrhythmia   . Fever of unknown origin 03/19/2017  . Hyperlipidemia   . Hypertension    denies at preop  . Multifocal pneumonia   . Neuromuscular disorder ([place])    neuropathy left arm and foot  . Osteopenia   . Paralysis ([place])    partial left side from CVA   . Persistent atrial fibrillation   . PONV (postoperative nausea and vomiting)   . Pre-diabetes   . Stroke [facility]) 2013   hemmorahgic    Past Surgical History:  Procedure Laterality Date  . ANKLE SURGERY    . APPENDECTOMY    . CHOLECYSTECTOMY    . HERNIA REPAIR     Esophagus  . JOINT REPLACEMENT     total- right partial- left  . MASTECTOMY PARTIAL / LUMPECTOMY  2012   left  . ORIF ANKLE FRACTURE Left 07/20/2018   Procedure: OPEN REDUCTION INTERNAL FIXATION (ORIF) ANKLE FRACTURE;  Surgeon: Wylene Simmer, MD;  Location: [place];  Service: Orthopedics;  Laterality: Left;  . TOTAL KNEE ARTHROPLASTY Left 01/27/2019   Procedure: TOTAL KNEE ARTHROPLASTY;  Surgeon: Vickey Huger, MD;  [facility];  Service: Orthopedics;  Laterality: Left;    There were no vitals filed for this visit.  Subjective Assessment - 05/28/19 1105    Subjective   Reports that the soreness is better today    Currently in Pain?  No/denies                       [facility] Adult PT Treatment/Exercise - 05/28/19 0001      Ambulation/Gait   Gait Comments  gait with FWW 60' x 3, cues to go and push her self, the first few steps are difficult for her to get moving      High Level Balance   High Level Balance Activities  Side stepping      Knee/Hip Exercises: Aerobic   Nustep  level 6 x 7 minutes'      Knee/Hip Exercises: Standing   Other Standing Knee Exercises  toe touches on 4" block, very difficult with this, changed to 2" and she did better, then we tried right foot up on the step to get her to bear weight through the left leg and hold this posistion 2x20    Other Standing Knee Exercises  standing with walker and worked on picking walker up and putting the front wheels over a dowel rod      Knee/Hip Exercises: Seated   Long Arc Quad  Strengthening;Left;10 reps;3 sets    Long  Arc Quad Weight  5 lbs.    Ball Squeeze  used red tband for left hip adduction this way I knew that the left leg was working.    Other Seated Knee/Hip Exercises  ankle green tband exercises               PT Short Term Goals - 03/20/19 1140      PT SHORT TERM GOAL #1   Title  independent with initial HEP    Status  Achieved        PT Long Term Goals - 05/23/19 1150      PT LONG TERM GOAL #6   Title  decrease TUG time to 45 seconds    Status  On-going            Plan - 05/28/19 1144    Clinical Impression Statement  patient has difficulty bearing weight through the left leg, tried some different ways to facilitate this with 4" and 2" toe touches and then leaving the right leg up on the step and having her stand, again the left leg tends to bend at the knee and then she needs cue to straighten the knee    PT Next Visit Plan  work on the left leg putting weight through the leg    Consulted and Agree with Plan of Care  Patient        Patient will benefit from skilled therapeutic intervention in order to improve the following deficits and impairments:  Abnormal gait, Decreased coordination, Decreased range of motion, Difficulty walking, Cardiopulmonary status limiting activity, Decreased endurance, Increased muscle spasms, Impaired UE functional use, Decreased activity tolerance, Pain, Decreased balance, Postural dysfunction, Decreased strength, Decreased mobility  Visit Diagnosis: Difficulty in walking, not elsewhere classified  Muscle weakness (generalized)  Late effects of CVA (cerebrovascular accident)  Stiffness of left knee, not elsewhere classified  Pain in left ankle and joints of left foot  Acute pain of left knee  Hemiplegia and hemiparesis following cerebral infarction affecting left non-dominant side [facility])     Problem List Patient Active Problem List   Diagnosis Date Noted  . S/P total knee replacement 01/27/2019  . Recurrent left knee instability 07/05/2018  . Respiratory failure with hypoxia ([place]) 08/30/2017  . Hypoxemia   . Heart failure with preserved ejection fraction ([place]), Grade 3 diastolic dysfunction [id number]  . PAF (paroxysmal atrial fibrillation) ([place])   . Dyspnea 03/19/2017  . Encounter for preventive health examination 02/17/2016  . Sensorineural hearing loss (SNHL), bilateral 01/26/2016  . Morbid obesity ([place]) 06/17/2015  . Hypomagnesemia 04/24/2014  . Hemiparesis affecting left side as late effect of cerebrovascular accident ([place]) 04/24/2014  . Nontraumatic cerebral hemorrhage ([place]) 04/30/2012  . DM (diabetes mellitus) with complications ([place]) 0000000  . OSTEOPENIA 01/21/2009  . UNSPECIFIED VITAMIN D DEFICIENCY 11/19/2007  . ESSENTIAL HYPERTENSION, BENIGN 11/19/2007  . HYPERCHOLESTEROLEMIA 10/25/2006  . GASTROESOPHAGEAL REFLUX, NO ESOPHAGITIS 10/25/2006  . DIVERTICULOSIS OF COLON 10/25/2006  . Osteoarthritis 10/25/2006  . CERVICAL SPINE DISORDER, NOS 10/25/2006     Sumner Boast., PT 05/28/2019, 11:47 AM  [address] [place], [place], [address] Phone: [phone]   Fax:  [phone]  Name: THOMASA SOLIS MRN: [id number] Date of Birth: 06-Oct-1940

## 2019-05-29 ENCOUNTER — Ambulatory Visit: Payer: Medicare Other

## 2019-05-29 DIAGNOSIS — L603 Nail dystrophy: Secondary | ICD-10-CM

## 2019-05-30 ENCOUNTER — Ambulatory Visit: Payer: Medicare Other | Attending: Orthopedic Surgery | Admitting: Physical Therapy

## 2019-05-30 ENCOUNTER — Other Ambulatory Visit: Payer: Self-pay

## 2019-05-30 ENCOUNTER — Other Ambulatory Visit: Payer: Self-pay | Admitting: Family Medicine

## 2019-05-30 ENCOUNTER — Encounter: Payer: Self-pay | Admitting: Physical Therapy

## 2019-05-30 DIAGNOSIS — M25662 Stiffness of left knee, not elsewhere classified: Secondary | ICD-10-CM | POA: Insufficient documentation

## 2019-05-30 DIAGNOSIS — I69354 Hemiplegia and hemiparesis following cerebral infarction affecting left non-dominant side: Secondary | ICD-10-CM

## 2019-05-30 DIAGNOSIS — M25562 Pain in left knee: Secondary | ICD-10-CM | POA: Diagnosis not present

## 2019-05-30 DIAGNOSIS — M6281 Muscle weakness (generalized): Secondary | ICD-10-CM | POA: Diagnosis not present

## 2019-05-30 DIAGNOSIS — R262 Difficulty in walking, not elsewhere classified: Secondary | ICD-10-CM | POA: Diagnosis not present

## 2019-05-30 DIAGNOSIS — M25572 Pain in left ankle and joints of left foot: Secondary | ICD-10-CM

## 2019-05-30 DIAGNOSIS — I6990 Unspecified sequelae of unspecified cerebrovascular disease: Secondary | ICD-10-CM | POA: Diagnosis not present

## 2019-05-30 DIAGNOSIS — E118 Type 2 diabetes mellitus with unspecified complications: Secondary | ICD-10-CM

## 2019-05-30 NOTE — Therapy (Signed)
[address] [place], [place], [address] Phone: [phone]   Fax:  [phone]  Physical Therapy Treatment  Patient Details  Name: Karen Jackson MRN: [id number] Date of Birth: Oct 18, 1940 Referring Provider (PT): Lucey   Encounter Date: 05/30/2019  PT End of Session - 05/30/19 1150    Visit Number  34    Date for PT Re-Evaluation  06/06/19    PT Start Time  1055    PT Stop Time  1140    PT Time Calculation (min)  45 min    Activity Tolerance  Patient tolerated treatment well    Behavior During Therapy  [facility] for tasks assessed/performed       Past Medical History:  Diagnosis Date  . Acute cystitis without hematuria   . Acute diastolic CHF (congestive heart failure) ([place])   . Arthritis   . Dyspnea   . Dysrhythmia   . Fever of unknown origin 03/19/2017  . Hyperlipidemia   . Hypertension    denies at preop  . Multifocal pneumonia   . Neuromuscular disorder ([place])    neuropathy left arm and foot  . Osteopenia   . Paralysis ([place])    partial left side from CVA   . Persistent atrial fibrillation ([place])   . PONV (postoperative nausea and vomiting)   . Pre-diabetes   . Stroke [facility]) 2013   hemmorahgic    Past Surgical History:  Procedure Laterality Date  . ANKLE SURGERY    . APPENDECTOMY    . CHOLECYSTECTOMY    . HERNIA REPAIR     Esophagus  . JOINT REPLACEMENT     total- right partial- left  . MASTECTOMY PARTIAL / LUMPECTOMY  2012   left  . ORIF ANKLE FRACTURE Left 07/20/2018   Procedure: OPEN REDUCTION INTERNAL FIXATION (ORIF) ANKLE FRACTURE;  Surgeon: Wylene Simmer, MD;  Location: [place];  Service: Orthopedics;  Laterality: Left;  . TOTAL KNEE ARTHROPLASTY Left 01/27/2019   Procedure: TOTAL KNEE ARTHROPLASTY;  Surgeon: Vickey Huger, MD;  [facility];  Service: Orthopedics;  Laterality: Left;    There were no vitals filed for this visit.  Subjective Assessment - 05/30/19 1124    Subjective  Patient reports that she has been doing well, no issues                       [facility] Adult PT Treatment/Exercise - 05/30/19 0001      Ambulation/Gait   Gait Comments  gait with FWW and CGA, some cues, 75' x 20, 30' x 1      High Level Balance   High Level Balance Comments  standing with right foot up on airex pad to get more weight on the left      Knee/Hip Exercises: Aerobic   Nustep  level 6 x 7 minutes'      Knee/Hip Exercises: Seated   Long Arc Quad  Strengthening;Left;10 reps;3 sets    [employer] Weight  5 lbs.    Ball Squeeze  used red tband for left hip adduction this way I knew that the left leg was working.    Other Seated Knee/Hip Exercises  ankle green tband exercises    Marching  Strengthening;Left;2 sets;10 reps    Marching Weights  3 lbs.    Hamstring Curl  Strengthening;Both;2 sets;10 reps    Hamstring Limitations  blue tband  PT Short Term Goals - 03/20/19 1140      PT SHORT TERM GOAL #1   Title  independent with initial HEP    Status  Achieved        PT Long Term Goals - 05/30/19 1154      PT LONG TERM GOAL #1   Title  walk with hand hold assist x 100 feet    Status  Partially Met      PT LONG TERM GOAL #2   Title  increased right LE strength to 4-/5    Status  Partially Met      PT LONG TERM GOAL #3   Title  walk 100 feet with SBA    Status  On-going      PT LONG TERM GOAL #4   Title  transfer independently with set up    Status  Partially Met      PT LONG TERM GOAL #5   Title  report no difficulty pulling her pants up when going to the bathroom    Status  On-going            Plan - 05/30/19 1151    Clinical Impression Statement  Patient did better with weight bearing and also did well with walking, she reports that she has been diligent about taking her lasix this week so with the walking she was much less short of breath.  Today she had sifficulty with LAQ's needing verbal and  tactile cues to get TKE, while on Wednesday she did the best I have seen her do.    PT Next Visit Plan  see if we can continue to progress her function, will decrease to 2x/week    Consulted and Agree with Plan of Care  Patient       Patient will benefit from skilled therapeutic intervention in order to improve the following deficits and impairments:  Abnormal gait, Decreased coordination, Decreased range of motion, Difficulty walking, Cardiopulmonary status limiting activity, Decreased endurance, Increased muscle spasms, Impaired UE functional use, Decreased activity tolerance, Pain, Decreased balance, Postural dysfunction, Decreased strength, Decreased mobility  Visit Diagnosis: Difficulty in walking, not elsewhere classified  Muscle weakness (generalized)  Late effects of CVA (cerebrovascular accident)  Stiffness of left knee, not elsewhere classified  Pain in left ankle and joints of left foot  Acute pain of left knee  Hemiplegia and hemiparesis following cerebral infarction affecting left non-dominant side [facility])     Problem List Patient Active Problem List   Diagnosis Date Noted  . S/P total knee replacement 01/27/2019  . Recurrent left knee instability 07/05/2018  . Respiratory failure with hypoxia ([place]) 08/30/2017  . Hypoxemia   . Heart failure with preserved ejection fraction ([place]), Grade 3 diastolic dysfunction 95/28/4132  . PAF (paroxysmal atrial fibrillation) ([place])   . Dyspnea 03/19/2017  . Encounter for preventive health examination 02/17/2016  . Sensorineural hearing loss (SNHL), bilateral 01/26/2016  . Morbid obesity ([place]) 06/17/2015  . Hypomagnesemia 04/24/2014  . Hemiparesis affecting left side as late effect of cerebrovascular accident ([place]) 04/24/2014  . Nontraumatic cerebral hemorrhage ([place]) 04/30/2012  . DM (diabetes mellitus) with complications ([place]) 44/08/270  . OSTEOPENIA 01/21/2009  . UNSPECIFIED VITAMIN D DEFICIENCY 11/19/2007  . ESSENTIAL  HYPERTENSION, BENIGN 11/19/2007  . HYPERCHOLESTEROLEMIA 10/25/2006  . GASTROESOPHAGEAL REFLUX, NO ESOPHAGITIS 10/25/2006  . DIVERTICULOSIS OF COLON 10/25/2006  . Osteoarthritis 10/25/2006  . CERVICAL SPINE DISORDER, NOS 10/25/2006    Sumner Boast., PT 05/30/2019, 11:55 AM  [facility]  [address] [place], [place], [address] Phone: [phone]   Fax:  [phone]  Name: Karen Jackson MRN: [id number] Date of Birth: Dec 30, 1940

## 2019-06-02 ENCOUNTER — Ambulatory Visit: Payer: Medicare Other | Admitting: Physical Therapy

## 2019-06-02 ENCOUNTER — Encounter: Payer: Self-pay | Admitting: Physical Therapy

## 2019-06-02 ENCOUNTER — Other Ambulatory Visit: Payer: Self-pay

## 2019-06-02 DIAGNOSIS — M25662 Stiffness of left knee, not elsewhere classified: Secondary | ICD-10-CM | POA: Diagnosis not present

## 2019-06-02 DIAGNOSIS — I69354 Hemiplegia and hemiparesis following cerebral infarction affecting left non-dominant side: Secondary | ICD-10-CM

## 2019-06-02 DIAGNOSIS — R262 Difficulty in walking, not elsewhere classified: Secondary | ICD-10-CM

## 2019-06-02 DIAGNOSIS — M6281 Muscle weakness (generalized): Secondary | ICD-10-CM

## 2019-06-02 DIAGNOSIS — M25572 Pain in left ankle and joints of left foot: Secondary | ICD-10-CM | POA: Diagnosis not present

## 2019-06-02 DIAGNOSIS — I6990 Unspecified sequelae of unspecified cerebrovascular disease: Secondary | ICD-10-CM

## 2019-06-02 DIAGNOSIS — M25562 Pain in left knee: Secondary | ICD-10-CM

## 2019-06-02 NOTE — Therapy (Signed)
[place] [place] [place], [place], [address] Phone: [phone]   Fax:  [phone]  Physical Therapy Treatment  Patient Details  Name: Karen Jackson MRN: [id number] Date of Birth: 1941-05-29 Referring Provider (PT): Lucey   Encounter Date: 06/02/2019  PT End of Session - 06/02/19 1303    Visit Number  35    Date for PT Re-Evaluation  06/06/19    PT Start Time  1055    PT Stop Time  1140    PT Time Calculation (min)  45 min    Activity Tolerance  Patient tolerated treatment well    Behavior During Therapy  [facility] for tasks assessed/performed       Past Medical History:  Diagnosis Date  . Acute cystitis without hematuria   . Acute diastolic CHF (congestive heart failure) ([place])   . Arthritis   . Dyspnea   . Dysrhythmia   . Fever of unknown origin 03/19/2017  . Hyperlipidemia   . Hypertension    denies at preop  . Multifocal pneumonia   . Neuromuscular disorder ([place])    neuropathy left arm and foot  . Osteopenia   . Paralysis ([place])    partial left side from CVA   . Persistent atrial fibrillation ([place])   . PONV (postoperative nausea and vomiting)   . Pre-diabetes   . Stroke [facility]) 2013   hemmorahgic    Past Surgical History:  Procedure Laterality Date  . ANKLE SURGERY    . APPENDECTOMY    . CHOLECYSTECTOMY    . HERNIA REPAIR     Esophagus  . JOINT REPLACEMENT     total- right partial- left  . MASTECTOMY PARTIAL / LUMPECTOMY  2012   left  . ORIF ANKLE FRACTURE Left 07/20/2018   Procedure: OPEN REDUCTION INTERNAL FIXATION (ORIF) ANKLE FRACTURE;  Surgeon: Wylene Simmer, MD;  Location: [facility];  Service: Orthopedics;  Laterality: Left;  . TOTAL KNEE ARTHROPLASTY Left 01/27/2019   Procedure: TOTAL KNEE ARTHROPLASTY;  Surgeon: Vickey Huger, MD;  [facility];  Service: Orthopedics;  Laterality: Left;    There were no vitals filed for this visit.  Subjective Assessment - 06/02/19 1104    Subjective  reports that she is sore in all her joints, she reports that the left ankle is rolling some this AM    Currently in Pain?  No/denies                       [facility] Adult PT Treatment/Exercise - 06/02/19 0001      Ambulation/Gait   Gait Comments  gait with walker, working on going longer distances at a time  22' and 36' with CGA, some encouragement as she gets tired and wants to sit      High Level Balance   High Level Balance Comments  standing with right foot up on airex pad to get more weight on the left, worked on standing on the left and tapping toe up on a 2" step, she did much better with this today      Knee/Hip Exercises: Aerobic   Nustep  level 6 x 7 minutes'      Knee/Hip Exercises: Seated   Long Arc Quad  Strengthening;Left;10 reps;3 sets    [employer] Weight  5 lbs.    Long [employer] Limitations  cues for [employer]  used red tband for left hip adduction this  way I knew that the left leg was working.    Other Seated Knee/Hip Exercises  ankle green tband exercises    Hamstring Curl  Strengthening;Both;2 sets;10 reps    Hamstring Limitations  blue tband      Manual Therapy   Manual Therapy  Passive ROM    Manual therapy comments  PROM of the left shoulder, approximation of the fingers with motions to stimulate increased function, scar mobilization, ankle ROM and approximation    Passive ROM  motion of the knee and then with pressure through the ankle                PT Short Term Goals - 03/20/19 1140      PT SHORT TERM GOAL #1   Title  independent with initial HEP    Status  Achieved        PT Long Term Goals - 05/30/19 1154      PT LONG TERM GOAL #1   Title  walk with hand hold assist x 100 feet    Status  Partially Met      PT LONG TERM GOAL #2   Title  increased right LE strength to 4-/5    Status  Partially Met      PT LONG TERM GOAL #3   Title  walk 100 feet with SBA    Status  On-going      PT LONG TERM  GOAL #4   Title  transfer independently with set up    Status  Partially Met      PT LONG TERM GOAL #5   Title  report no difficulty pulling her pants up when going to the bathroom    Status  On-going            Plan - 06/02/19 1303    Clinical Impression Statement  Pushed her to walk farther without rest, she does need encouragement because when she gets tired she really wants to sit down right then.  She did have some ankle soreness and I did some PROM of this with some approximation to help and it seemed to relieve the soreness    PT Next Visit Plan  see if we can continue to progress her function, will decrease to 2x/week    Consulted and Agree with Plan of Care  Patient       Patient will benefit from skilled therapeutic intervention in order to improve the following deficits and impairments:  Abnormal gait, Decreased coordination, Decreased range of motion, Difficulty walking, Cardiopulmonary status limiting activity, Decreased endurance, Increased muscle spasms, Impaired UE functional use, Decreased activity tolerance, Pain, Decreased balance, Postural dysfunction, Decreased strength, Decreased mobility  Visit Diagnosis: Difficulty in walking, not elsewhere classified  Muscle weakness (generalized)  Late effects of CVA (cerebrovascular accident)  Stiffness of left knee, not elsewhere classified  Pain in left ankle and joints of left foot  Acute pain of left knee  Hemiplegia and hemiparesis following cerebral infarction affecting left non-dominant side [facility])     Problem List Patient Active Problem List   Diagnosis Date Noted  . S/P total knee replacement 01/27/2019  . Recurrent left knee instability 07/05/2018  . Respiratory failure with hypoxia ([place]) 08/30/2017  . Hypoxemia   . Heart failure with preserved ejection fraction ([place]), Grade 3 diastolic dysfunction 74/94/4967  . PAF (paroxysmal atrial fibrillation) ([place])   . Dyspnea 03/19/2017  . Encounter for  preventive health examination 02/17/2016  . Sensorineural hearing loss (SNHL), bilateral 01/26/2016  .  Morbid obesity ([place]) 06/17/2015  . Hypomagnesemia 04/24/2014  . Hemiparesis affecting left side as late effect of cerebrovascular accident ([place]) 04/24/2014  . Nontraumatic cerebral hemorrhage ([place]) 04/30/2012  . DM (diabetes mellitus) with complications ([place]) 53/39/1792  . OSTEOPENIA 01/21/2009  . UNSPECIFIED VITAMIN D DEFICIENCY 11/19/2007  . ESSENTIAL HYPERTENSION, BENIGN 11/19/2007  . HYPERCHOLESTEROLEMIA 10/25/2006  . GASTROESOPHAGEAL REFLUX, NO ESOPHAGITIS 10/25/2006  . DIVERTICULOSIS OF COLON 10/25/2006  . Osteoarthritis 10/25/2006  . CERVICAL SPINE DISORDER, NOS 10/25/2006    Sumner Boast., PT 06/02/2019, 1:05 PM  [address] [place], [place], [address] Phone: [phone]   Fax:  [phone]  Name: Karen Jackson MRN: [id number] Date of Birth: 08/26/1941

## 2019-06-05 ENCOUNTER — Other Ambulatory Visit: Payer: Self-pay

## 2019-06-05 ENCOUNTER — Ambulatory Visit: Payer: Medicare Other | Admitting: Physical Therapy

## 2019-06-05 DIAGNOSIS — M25572 Pain in left ankle and joints of left foot: Secondary | ICD-10-CM | POA: Diagnosis not present

## 2019-06-05 DIAGNOSIS — M25662 Stiffness of left knee, not elsewhere classified: Secondary | ICD-10-CM | POA: Diagnosis not present

## 2019-06-05 DIAGNOSIS — I6990 Unspecified sequelae of unspecified cerebrovascular disease: Secondary | ICD-10-CM | POA: Diagnosis not present

## 2019-06-05 DIAGNOSIS — M6281 Muscle weakness (generalized): Secondary | ICD-10-CM | POA: Diagnosis not present

## 2019-06-05 DIAGNOSIS — R262 Difficulty in walking, not elsewhere classified: Secondary | ICD-10-CM | POA: Diagnosis not present

## 2019-06-05 DIAGNOSIS — M25562 Pain in left knee: Secondary | ICD-10-CM | POA: Diagnosis not present

## 2019-06-05 NOTE — Therapy (Signed)
[address] [place], [place], [address] Phone: [phone]   Fax:  [phone]  Physical Therapy Treatment  Patient Details  Name: Karen Jackson MRN: [id number] Date of Birth: 10-23-1940 Referring Provider (PT): Lucey   Encounter Date: 06/05/2019  PT End of Session - 06/05/19 1147    Visit Number  36    Date for PT Re-Evaluation  06/06/19    PT Start Time  1055    PT Stop Time  1140    PT Time Calculation (min)  45 min       Past Medical History:  Diagnosis Date  . Acute cystitis without hematuria   . Acute diastolic CHF (congestive heart failure) ([place])   . Arthritis   . Dyspnea   . Dysrhythmia   . Fever of unknown origin 03/19/2017  . Hyperlipidemia   . Hypertension    denies at preop  . Multifocal pneumonia   . Neuromuscular disorder ([place])    neuropathy left arm and foot  . Osteopenia   . Paralysis ([place])    partial left side from CVA   . Persistent atrial fibrillation ([place])   . PONV (postoperative nausea and vomiting)   . Pre-diabetes   . Stroke [facility]) 2013   hemmorahgic    Past Surgical History:  Procedure Laterality Date  . ANKLE SURGERY    . APPENDECTOMY    . CHOLECYSTECTOMY    . HERNIA REPAIR     Esophagus  . JOINT REPLACEMENT     total- right partial- left  . MASTECTOMY PARTIAL / LUMPECTOMY  2012   left  . ORIF ANKLE FRACTURE Left 07/20/2018   Procedure: OPEN REDUCTION INTERNAL FIXATION (ORIF) ANKLE FRACTURE;  Surgeon: Wylene Simmer, MD;  Location: [facility];  Service: Orthopedics;  Laterality: Left;  . TOTAL KNEE ARTHROPLASTY Left 01/27/2019   Procedure: TOTAL KNEE ARTHROPLASTY;  Surgeon: Vickey Huger, MD;  [facility];  Service: Orthopedics;  Laterality: Left;    There were no vitals filed for this visit.  Subjective Assessment - 06/05/19 1109    Subjective  able to go out to lunch yesterday with CG, ankle doing better    Currently in Pain?  No/denies                        [facility] Adult PT Treatment/Exercise - 06/05/19 0001      Transfers   Transfers  Stand Pivot Transfers   min A to/from car     Ambulation/Gait   Gait Comments  gait with RW 100 feet min A with w/c behind      High Level Balance   High Level Balance Comments  statci standing without UE on walker and tried to toss ball-very difficult for pt      Knee/Hip Exercises: Aerobic   Nustep  level 6 x 7 minutes'      Knee/Hip Exercises: Standing   Other Standing Knee Exercises  toe touches on 4inch with 3#, marching 3#, hip flex 3#, hip abd 3#   all min A in set sof 5 with cuing to stay upright   Other Standing Knee Exercises  STS and standing with RT foot on airex to increase wt thru Left LE               PT Short Term Goals - 03/20/19 1140      PT SHORT TERM GOAL #1   Title  independent with  initial HEP    Status  Achieved        PT Long Term Goals - 05/30/19 1154      PT LONG TERM GOAL #1   Title  walk with hand hold assist x 100 feet    Status  Partially Met      PT LONG TERM GOAL #2   Title  increased right LE strength to 4-/5    Status  Partially Met      PT LONG TERM GOAL #3   Title  walk 100 feet with SBA    Status  On-going      PT LONG TERM GOAL #4   Title  transfer independently with set up    Status  Partially Met      PT LONG TERM GOAL #5   Title  report no difficulty pulling her pants up when going to the bathroom    Status  On-going            Plan - 06/05/19 1148    Clinical Impression Statement  pt walked 100 feet today and did very well but then stated to tired to walk again. tolertaed standing ex well with 3# but needed cuing to stay up talland not lean back, better at righting herself. car transfers min A and much improved    PT Treatment/Interventions  ADLs/Self Care Home Management;Cryotherapy;Electrical Stimulation;Iontophoresis 50m/ml Dexamethasone;Moist Heat;Ultrasound;Gait training;Balance  training;Therapeutic exercise;Therapeutic activities;Functional mobility training;Patient/family education;Manual techniques;Dry needling    PT Next Visit Plan  see if we can continue to progress her function, will decrease to 2x/week       Patient will benefit from skilled therapeutic intervention in order to improve the following deficits and impairments:  Abnormal gait, Decreased coordination, Decreased range of motion, Difficulty walking, Cardiopulmonary status limiting activity, Decreased endurance, Increased muscle spasms, Impaired UE functional use, Decreased activity tolerance, Pain, Decreased balance, Postural dysfunction, Decreased strength, Decreased mobility  Visit Diagnosis: Difficulty in walking, not elsewhere classified  Muscle weakness (generalized)  Late effects of CVA (cerebrovascular accident)     Problem List Patient Active Problem List   Diagnosis Date Noted  . S/P total knee replacement 01/27/2019  . Recurrent left knee instability 07/05/2018  . Respiratory failure with hypoxia ([place] 08/30/2017  . Hypoxemia   . Heart failure with preserved ejection fraction ([place], Grade 3 diastolic dysfunction 059/74/1638 . PAF (paroxysmal atrial fibrillation) ([place]   . Dyspnea 03/19/2017  . Encounter for preventive health examination 02/17/2016  . Sensorineural hearing loss (SNHL), bilateral 01/26/2016  . Morbid obesity ([place] 06/17/2015  . Hypomagnesemia 04/24/2014  . Hemiparesis affecting left side as late effect of cerebrovascular accident ([place] 04/24/2014  . Nontraumatic cerebral hemorrhage ([place] 04/30/2012  . DM (diabetes mellitus) with complications ([place] 045/36/4680 . OSTEOPENIA 01/21/2009  . UNSPECIFIED VITAMIN D DEFICIENCY 11/19/2007  . ESSENTIAL HYPERTENSION, BENIGN 11/19/2007  . HYPERCHOLESTEROLEMIA 10/25/2006  . GASTROESOPHAGEAL REFLUX, NO ESOPHAGITIS 10/25/2006  . DIVERTICULOSIS OF COLON 10/25/2006  . Osteoarthritis 10/25/2006  . CERVICAL SPINE DISORDER, NOS  10/25/2006    ,ANGIE  PTA 06/05/2019, 11:51 AM  [address] [address]Phone: [phone]  Fax:  [phone] Name: Karen COGANMRN: [id number] of Birth: 1January 31, 1942

## 2019-06-05 NOTE — Progress Notes (Signed)
Patient is here today for a follow up appt, recent procedure performed on 9.10.20, removal of ingrown toenail. She states that the area is slightly sore but she continues to soak and bandage her toe daily.   No redness, no erythema, no swelling, no drainage, no other s/s of infection. The area is scabbed over and healing well at this time.    Discussed s/s of infection, verbal and written instructions were given. She is to follow up with any acute symptom changes

## 2019-06-06 DIAGNOSIS — H6123 Impacted cerumen, bilateral: Secondary | ICD-10-CM | POA: Diagnosis not present

## 2019-06-09 ENCOUNTER — Other Ambulatory Visit: Payer: Self-pay

## 2019-06-09 ENCOUNTER — Encounter: Payer: Self-pay | Admitting: Physical Therapy

## 2019-06-09 ENCOUNTER — Ambulatory Visit: Payer: Medicare Other | Admitting: Physical Therapy

## 2019-06-09 DIAGNOSIS — M25572 Pain in left ankle and joints of left foot: Secondary | ICD-10-CM | POA: Diagnosis not present

## 2019-06-09 DIAGNOSIS — M25562 Pain in left knee: Secondary | ICD-10-CM | POA: Diagnosis not present

## 2019-06-09 DIAGNOSIS — R262 Difficulty in walking, not elsewhere classified: Secondary | ICD-10-CM

## 2019-06-09 DIAGNOSIS — M25662 Stiffness of left knee, not elsewhere classified: Secondary | ICD-10-CM

## 2019-06-09 DIAGNOSIS — I69354 Hemiplegia and hemiparesis following cerebral infarction affecting left non-dominant side: Secondary | ICD-10-CM

## 2019-06-09 DIAGNOSIS — M6281 Muscle weakness (generalized): Secondary | ICD-10-CM | POA: Diagnosis not present

## 2019-06-09 DIAGNOSIS — I6990 Unspecified sequelae of unspecified cerebrovascular disease: Secondary | ICD-10-CM

## 2019-06-09 NOTE — Therapy (Signed)
[place], [place], [address] Phone: [phone]   Fax:  [phone]  Physical Therapy Treatment  Patient Details  Name: Karen LEVEILLE MRN: [id number] Date of Birth: 09-04-1940 Referring Provider (PT): Lucey   Encounter Date: 06/09/2019  PT End of Session - 06/09/19 1108    Visit Number  37    Date for PT Re-Evaluation  07/10/19    PT Start Time  1010    PT Stop Time  1055    PT Time Calculation (min)  45 min    Activity Tolerance  Patient tolerated treatment well    Behavior During Therapy  [facility] for tasks assessed/performed       Past Medical History:  Diagnosis Date  . Acute cystitis without hematuria   . Acute diastolic CHF (congestive heart failure) ([place])   . Arthritis   . Dyspnea   . Dysrhythmia   . Fever of unknown origin 03/19/2017  . Hyperlipidemia   . Hypertension    denies at preop  . Multifocal pneumonia   . Neuromuscular disorder ([place])    neuropathy left arm and foot  . Osteopenia   . Paralysis ([place])    partial left side from CVA   . Persistent atrial fibrillation ([place])   . PONV (postoperative nausea and vomiting)   . Pre-diabetes   . Stroke [facility]) 2013   hemmorahgic    Past Surgical History:  Procedure Laterality Date  . ANKLE SURGERY    . APPENDECTOMY    . CHOLECYSTECTOMY    . HERNIA REPAIR     Esophagus  . JOINT REPLACEMENT     total- right partial- left  . MASTECTOMY PARTIAL / LUMPECTOMY  2012   left  . ORIF ANKLE FRACTURE Left 07/20/2018   Procedure: OPEN REDUCTION INTERNAL FIXATION (ORIF) ANKLE FRACTURE;  Surgeon: Wylene Simmer, MD;  [facility];  Service: Orthopedics;  Laterality: Left;  . TOTAL KNEE ARTHROPLASTY Left 01/27/2019   Procedure: TOTAL KNEE ARTHROPLASTY;  Surgeon: Vickey Huger, MD;  [facility];  Service: Orthopedics;  Laterality: Left;    There were no vitals filed for this visit.  Subjective Assessment - 06/09/19 1016    Subjective  I am very sore due to the bad weather all week.    Currently in Pain?  Yes    Pain Location  Ankle   knee   Pain Orientation  Left    Pain Descriptors / Indicators  Aching;Sore    Aggravating Factors   weather         [facility] PT Assessment - 06/09/19 0001      Assessment   Medical Diagnosis  s/p left TKR, weakness, difficulty walking    Referring Provider (PT)  Lucey                   [facility] Adult PT Treatment/Exercise - 06/09/19 0001      Ambulation/Gait   Gait Comments  gait today wihtout any one following her with a chair 2 laps in the gym 2x60' then one time with a w/c following 75'      High Level Balance   High Level Balance Activities  Side stepping      Knee/Hip Exercises: Aerobic   Nustep  level 6 x 7 minutes'      Knee/Hip Exercises: Seated   Long Arc Quad  Strengthening;Left;10 reps;3 sets    [employer] Weight  5 lbs.  Other Seated Knee/Hip Exercises  ankle green tband exercises    Marching  Strengthening;Left;2 sets;10 reps    Marching Weights  3 lbs.    Hamstring Curl  Strengthening;Both;2 sets;10 reps    Hamstring Limitations  blue tband      Manual Therapy   Manual Therapy  Passive ROM    Passive ROM  motion of the knee and then with pressure through the ankle , this seems to help her pain levels               PT Short Term Goals - 03/20/19 1140      PT SHORT TERM GOAL #1   Title  independent with initial HEP    Status  Achieved        PT Long Term Goals - 06/09/19 1112      PT LONG TERM GOAL #1   Title  walk with hand hold assist x 100 feet    Status  Partially Met      PT LONG TERM GOAL #2   Title  increased right LE strength to 4-/5    Status  Partially Met      PT LONG TERM GOAL #3   Title  walk 100 feet with SBA    Status  Partially Met      PT LONG TERM GOAL #4   Title  transfer independently with set up    Status  Partially Met      PT LONG TERM GOAL #5   Title  report no difficulty pulling  her pants up when going to the bathroom    Status  Partially Met      PT LONG TERM GOAL #6   Title  decrease TUG time to 45 seconds    Status  On-going            Plan - 06/09/19 1109    Clinical Impression Statement  Patient was very sore today possible from the rain all weekend, we tried walking without cahir following, and she did well with this.  She does get SOB with walking. still at times is unsafe with her transfers sitting too early    PT Next Visit Plan  see if we can continue to progress her function, will decrease to 2x/week    Consulted and Agree with Plan of Care  Patient       Patient will benefit from skilled therapeutic intervention in order to improve the following deficits and impairments:  Abnormal gait, Decreased coordination, Decreased range of motion, Difficulty walking, Cardiopulmonary status limiting activity, Decreased endurance, Increased muscle spasms, Impaired UE functional use, Decreased activity tolerance, Pain, Decreased balance, Postural dysfunction, Decreased strength, Decreased mobility  Visit Diagnosis: Difficulty in walking, not elsewhere classified - Plan: PT plan of care cert/re-cert  Muscle weakness (generalized) - Plan: PT plan of care cert/re-cert  Late effects of CVA (cerebrovascular accident) - Plan: PT plan of care cert/re-cert  Stiffness of left knee, not elsewhere classified - Plan: PT plan of care cert/re-cert  Pain in left ankle and joints of left foot - Plan: PT plan of care cert/re-cert  Acute pain of left knee - Plan: PT plan of care cert/re-cert  Hemiplegia and hemiparesis following cerebral infarction affecting left non-dominant side ([place]) - Plan: PT plan of care cert/re-cert     Problem List Patient Active Problem List   Diagnosis Date Noted  . S/P total knee replacement 01/27/2019  . Recurrent left knee instability 07/05/2018  . Respiratory failure  with hypoxia ([place]) 08/30/2017  . Hypoxemia   . Heart failure with  preserved ejection fraction ([place]), Grade 3 diastolic dysfunction 02/54/2706  . PAF (paroxysmal atrial fibrillation) ([place])   . Dyspnea 03/19/2017  . Encounter for preventive health examination 02/17/2016  . Sensorineural hearing loss (SNHL), bilateral 01/26/2016  . Morbid obesity ([place]) 06/17/2015  . Hypomagnesemia 04/24/2014  . Hemiparesis affecting left side as late effect of cerebrovascular accident ([place]) 04/24/2014  . Nontraumatic cerebral hemorrhage ([place]) 04/30/2012  . DM (diabetes mellitus) with complications ([place]) 23/76/2831  . OSTEOPENIA 01/21/2009  . UNSPECIFIED VITAMIN D DEFICIENCY 11/19/2007  . ESSENTIAL HYPERTENSION, BENIGN 11/19/2007  . HYPERCHOLESTEROLEMIA 10/25/2006  . GASTROESOPHAGEAL REFLUX, NO ESOPHAGITIS 10/25/2006  . DIVERTICULOSIS OF COLON 10/25/2006  . Osteoarthritis 10/25/2006  . CERVICAL SPINE DISORDER, NOS 10/25/2006    Sumner Boast., PT 06/09/2019, 11:14 AM  [place] [address] [place], [place], [address] Phone: [phone]   Fax:  [phone]  Name: DILIA ALEMANY MRN: [id number] Date of Birth: [birth year]

## 2019-06-12 ENCOUNTER — Other Ambulatory Visit: Payer: Self-pay

## 2019-06-12 ENCOUNTER — Encounter: Payer: Self-pay | Admitting: Physical Therapy

## 2019-06-12 ENCOUNTER — Ambulatory Visit: Payer: Medicare Other | Admitting: Physical Therapy

## 2019-06-12 DIAGNOSIS — M25572 Pain in left ankle and joints of left foot: Secondary | ICD-10-CM

## 2019-06-12 DIAGNOSIS — M25662 Stiffness of left knee, not elsewhere classified: Secondary | ICD-10-CM

## 2019-06-12 DIAGNOSIS — M6281 Muscle weakness (generalized): Secondary | ICD-10-CM

## 2019-06-12 DIAGNOSIS — I69354 Hemiplegia and hemiparesis following cerebral infarction affecting left non-dominant side: Secondary | ICD-10-CM

## 2019-06-12 DIAGNOSIS — I6990 Unspecified sequelae of unspecified cerebrovascular disease: Secondary | ICD-10-CM

## 2019-06-12 DIAGNOSIS — M25562 Pain in left knee: Secondary | ICD-10-CM

## 2019-06-12 DIAGNOSIS — R262 Difficulty in walking, not elsewhere classified: Secondary | ICD-10-CM | POA: Diagnosis not present

## 2019-06-12 NOTE — Therapy (Signed)
[place], [place], [address] Phone: [phone]   Fax:  [phone]  Physical Therapy Treatment  Patient Details  Name: Karen Jackson MRN: [id number] Date of Birth: 1940-09-03 Referring Provider (PT): Lucey   Encounter Date: 06/12/2019  PT End of Session - 06/12/19 1144    Visit Number  38    Date for PT Re-Evaluation  07/10/19    PT Start Time  1055    PT Stop Time  1135    PT Time Calculation (min)  40 min    Activity Tolerance  Patient tolerated treatment well    Behavior During Therapy  [facility] for tasks assessed/performed       Past Medical History:  Diagnosis Date  . Acute cystitis without hematuria   . Acute diastolic CHF (congestive heart failure) ([place])   . Arthritis   . Dyspnea   . Dysrhythmia   . Fever of unknown origin 03/19/2017  . Hyperlipidemia   . Hypertension    denies at preop  . Multifocal pneumonia   . Neuromuscular disorder ([place])    neuropathy left arm and foot  . Osteopenia   . Paralysis ([place])    partial left side from CVA   . Persistent atrial fibrillation ([place])   . PONV (postoperative nausea and vomiting)   . Pre-diabetes   . Stroke [facility]) 2013   hemmorahgic    Past Surgical History:  Procedure Laterality Date  . ANKLE SURGERY    . APPENDECTOMY    . CHOLECYSTECTOMY    . HERNIA REPAIR     Esophagus  . JOINT REPLACEMENT     total- right partial- left  . MASTECTOMY PARTIAL / LUMPECTOMY  2012   left  . ORIF ANKLE FRACTURE Left 07/20/2018   Procedure: OPEN REDUCTION INTERNAL FIXATION (ORIF) ANKLE FRACTURE;  Surgeon: Wylene Simmer, MD;  Location: [facility];  Service: Orthopedics;  Laterality: Left;  . TOTAL KNEE ARTHROPLASTY Left 01/27/2019   Procedure: TOTAL KNEE ARTHROPLASTY;  Surgeon: Vickey Huger, MD;  [facility];  Service: Orthopedics;  Laterality: Left;    There were no vitals filed for this visit.  Subjective Assessment - 06/12/19 1113    Subjective  Still sore a little, reports that she has a caregive that has a lot of difficulty transferring her    Currently in Pain?  No/denies         [facility] PT Assessment - 06/12/19 0001      ROM / Strength   AROM / PROM / Strength  PROM      AROM   Left Knee Extension  0    Left Knee Flexion  95      PROM   PROM Assessment Site  Knee    Right/Left Knee  Left    Left Knee Flexion  105                   [facility] Adult PT Treatment/Exercise - 06/12/19 0001      Ambulation/Gait   Gait Comments  met her today and had her walk in once she was inside, 48' , then we walked 120'       Knee/Hip Exercises: Seated   Long Arc Quad  Strengthening;Left;10 reps;3 sets    [employer] Weight  5 lbs.    Other Seated Knee/Hip Exercises  ankle green tband exercises    Marching  Strengthening;Left;2 sets;10 reps    Marching Weights  3  lbs.    Hamstring Curl  Strengthening;Both;2 sets;10 reps    Hamstring Limitations  blue tband      Manual Therapy   Manual Therapy  Passive ROM    Manual therapy comments  PROM of the left shoulder, approximation of the fingers with motions to stimulate increased function, scar mobilization, ankle ROM and approximation    Passive ROM  motion of the knee and then with pressure through the ankle , this seems to help her pain levels               PT Short Term Goals - 03/20/19 1140      PT SHORT TERM GOAL #1   Title  independent with initial HEP    Status  Achieved        PT Long Term Goals - 06/12/19 1146      PT LONG TERM GOAL #1   Title  walk with hand hold assist x 100 feet    Status  Achieved      PT LONG TERM GOAL #4   Title  transfer independently with set up    Status  Partially Met            Plan - 06/12/19 1144    Clinical Impression Statement  Patietn had some stiffness in the knee today so did a little more stretching into flexion.  We did do a little more straight line walking for longer distances, this is  difficult due to SOB, needs encouragement to keep going especially after 60 feet due to fatigue.    PT Next Visit Plan  continue to work on her function and independence    Consulted and Agree with Plan of Care  Patient       Patient will benefit from skilled therapeutic intervention in order to improve the following deficits and impairments:  Abnormal gait, Decreased coordination, Decreased range of motion, Difficulty walking, Cardiopulmonary status limiting activity, Decreased endurance, Increased muscle spasms, Impaired UE functional use, Decreased activity tolerance, Pain, Decreased balance, Postural dysfunction, Decreased strength, Decreased mobility  Visit Diagnosis: Difficulty in walking, not elsewhere classified  Muscle weakness (generalized)  Late effects of CVA (cerebrovascular accident)  Stiffness of left knee, not elsewhere classified  Pain in left ankle and joints of left foot  Acute pain of left knee  Hemiplegia and hemiparesis following cerebral infarction affecting left non-dominant side [facility])     Problem List Patient Active Problem List   Diagnosis Date Noted  . S/P total knee replacement 01/27/2019  . Recurrent left knee instability 07/05/2018  . Respiratory failure with hypoxia ([place]) 08/30/2017  . Hypoxemia   . Heart failure with preserved ejection fraction ([place]), Grade 3 diastolic dysfunction 09/73/5329  . PAF (paroxysmal atrial fibrillation) ([place])   . Dyspnea 03/19/2017  . Encounter for preventive health examination 02/17/2016  . Sensorineural hearing loss (SNHL), bilateral 01/26/2016  . Morbid obesity ([place]) 06/17/2015  . Hypomagnesemia 04/24/2014  . Hemiparesis affecting left side as late effect of cerebrovascular accident ([place]) 04/24/2014  . Nontraumatic cerebral hemorrhage ([place]) 04/30/2012  . DM (diabetes mellitus) with complications ([place]) 92/42/6834  . OSTEOPENIA 01/21/2009  . UNSPECIFIED VITAMIN D DEFICIENCY 11/19/2007  . ESSENTIAL HYPERTENSION,  BENIGN 11/19/2007  . HYPERCHOLESTEROLEMIA 10/25/2006  . GASTROESOPHAGEAL REFLUX, NO ESOPHAGITIS 10/25/2006  . DIVERTICULOSIS OF COLON 10/25/2006  . Osteoarthritis 10/25/2006  . CERVICAL SPINE DISORDER, NOS 10/25/2006    Sumner Boast., PT 06/12/2019, 11:49 AM  [place], [place], [address] Phone: [phone]   Fax:  [phone]  Name: Karen Jackson MRN: [id number] Date of Birth: 1940/10/28

## 2019-06-16 ENCOUNTER — Other Ambulatory Visit: Payer: Self-pay

## 2019-06-16 ENCOUNTER — Encounter: Payer: Self-pay | Admitting: Physical Therapy

## 2019-06-16 ENCOUNTER — Ambulatory Visit: Payer: Medicare Other | Admitting: Physical Therapy

## 2019-06-16 DIAGNOSIS — M25662 Stiffness of left knee, not elsewhere classified: Secondary | ICD-10-CM | POA: Diagnosis not present

## 2019-06-16 DIAGNOSIS — M25562 Pain in left knee: Secondary | ICD-10-CM | POA: Diagnosis not present

## 2019-06-16 DIAGNOSIS — R262 Difficulty in walking, not elsewhere classified: Secondary | ICD-10-CM

## 2019-06-16 DIAGNOSIS — M25572 Pain in left ankle and joints of left foot: Secondary | ICD-10-CM | POA: Diagnosis not present

## 2019-06-16 DIAGNOSIS — I6990 Unspecified sequelae of unspecified cerebrovascular disease: Secondary | ICD-10-CM | POA: Diagnosis not present

## 2019-06-16 DIAGNOSIS — M6281 Muscle weakness (generalized): Secondary | ICD-10-CM

## 2019-06-16 DIAGNOSIS — I69354 Hemiplegia and hemiparesis following cerebral infarction affecting left non-dominant side: Secondary | ICD-10-CM

## 2019-06-16 NOTE — Therapy (Signed)
[place], [place], [address] Phone: [phone]   Fax:  [phone]  Physical Therapy Treatment  Patient Details  Name: Karen Jackson MRN: [id number] Date of Birth: 04-Oct-1940 Referring Provider (PT): Lucey   Encounter Date: 06/16/2019  PT End of Session - 06/16/19 1139    Visit Number  39    Date for PT Re-Evaluation  07/10/19    PT Start Time  1055    PT Stop Time  1138    PT Time Calculation (min)  43 min    Activity Tolerance  Patient tolerated treatment well    Behavior During Therapy  [facility] for tasks assessed/performed       Past Medical History:  Diagnosis Date  . Acute cystitis without hematuria   . Acute diastolic CHF (congestive heart failure) ([place])   . Arthritis   . Dyspnea   . Dysrhythmia   . Fever of unknown origin 03/19/2017  . Hyperlipidemia   . Hypertension    denies at preop  . Multifocal pneumonia   . Neuromuscular disorder ([place])    neuropathy left arm and foot  . Osteopenia   . Paralysis ([place])    partial left side from CVA   . Persistent atrial fibrillation ([place])   . PONV (postoperative nausea and vomiting)   . Pre-diabetes   . Stroke [facility]) 2013   hemmorahgic    Past Surgical History:  Procedure Laterality Date  . ANKLE SURGERY    . APPENDECTOMY    . CHOLECYSTECTOMY    . HERNIA REPAIR     Esophagus  . JOINT REPLACEMENT     total- right partial- left  . MASTECTOMY PARTIAL / LUMPECTOMY  2012   left  . ORIF ANKLE FRACTURE Left 07/20/2018   Procedure: OPEN REDUCTION INTERNAL FIXATION (ORIF) ANKLE FRACTURE;  Surgeon: Wylene Simmer, MD;  Location: [place];  Service: Orthopedics;  Laterality: Left;  . TOTAL KNEE ARTHROPLASTY Left 01/27/2019   Procedure: TOTAL KNEE ARTHROPLASTY;  Surgeon: Vickey Huger, MD;  [facility];  Service: Orthopedics;  Laterality: Left;    There were no vitals filed for this visit.  Subjective Assessment - 06/16/19 1116    Subjective  Stiff and sore today    Currently in Pain?  No/denies                       [facility] Adult PT Treatment/Exercise - 06/16/19 0001      Transfers   Comments  worked on some with her trying to use the walker to transfer from chair to chair on her own asn she is going to try to go to church and out to eat, this did not go well as she contiued to pull backward      Ambulation/Gait   Gait Comments  met her today and had her walk in once she was inside, 66' , then we walked 120' , finished up with her walking some outside about 25 feet on the pavement.      Knee/Hip Exercises: Aerobic   Nustep  level 6 x 7 minutes'      Manual Therapy   Manual Therapy  Passive ROM    Manual therapy comments  PROM of the left shoulder, approximation of the fingers with motions to stimulate increased function, scar mobilization, ankle ROM and approximation    Passive ROM  motion of the knee and then with pressure through the ankle ,  this seems to help her pain levels               PT Short Term Goals - 03/20/19 1140      PT SHORT TERM GOAL #1   Title  independent with initial HEP    Status  Achieved        PT Long Term Goals - 06/16/19 1144      PT LONG TERM GOAL #3   Title  walk 100 feet with SBA    Status  Partially Met      PT LONG TERM GOAL #4   Title  transfer independently with set up    Status  Partially Met            Plan - 06/16/19 1140    Clinical Impression Statement  Patient on straight line walking on the carpet does really well, limited by fatigue, I had her try some on the pavement, this is much more diffiuclt and fearful to her, she has difficulty with the left hand on the walker and pushing over little cracks and pebbles, we tried some transfers trying to get her to be independent from chair to chair, this did not go well as she pulled back ont he walker, I do feel that this is somethingthat she did not want to do as she reports wanting to  transfer with people using the HHA method, I tried to speak with her about independence and why we sould do with walker.    PT Next Visit Plan  continue to work on her function and independence    Consulted and Agree with Plan of Care  Patient       Patient will benefit from skilled therapeutic intervention in order to improve the following deficits and impairments:  Abnormal gait, Decreased coordination, Decreased range of motion, Difficulty walking, Cardiopulmonary status limiting activity, Decreased endurance, Increased muscle spasms, Impaired UE functional use, Decreased activity tolerance, Pain, Decreased balance, Postural dysfunction, Decreased strength, Decreased mobility  Visit Diagnosis: Difficulty in walking, not elsewhere classified  Muscle weakness (generalized)  Late effects of CVA (cerebrovascular accident)  Stiffness of left knee, not elsewhere classified  Pain in left ankle and joints of left foot  Acute pain of left knee  Hemiplegia and hemiparesis following cerebral infarction affecting left non-dominant side [facility])     Problem List Patient Active Problem List   Diagnosis Date Noted  . S/P total knee replacement 01/27/2019  . Recurrent left knee instability 07/05/2018  . Respiratory failure with hypoxia ([place]) 08/30/2017  . Hypoxemia   . Heart failure with preserved ejection fraction ([place]), Grade 3 diastolic dysfunction 50/53/9767  . PAF (paroxysmal atrial fibrillation) ([place])   . Dyspnea 03/19/2017  . Encounter for preventive health examination 02/17/2016  . Sensorineural hearing loss (SNHL), bilateral 01/26/2016  . Morbid obesity ([place]) 06/17/2015  . Hypomagnesemia 04/24/2014  . Hemiparesis affecting left side as late effect of cerebrovascular accident ([place]) 04/24/2014  . Nontraumatic cerebral hemorrhage ([place]) 04/30/2012  . DM (diabetes mellitus) with complications ([place]) 34/19/3790  . OSTEOPENIA 01/21/2009  . UNSPECIFIED VITAMIN D DEFICIENCY 11/19/2007  .  ESSENTIAL HYPERTENSION, BENIGN 11/19/2007  . HYPERCHOLESTEROLEMIA 10/25/2006  . GASTROESOPHAGEAL REFLUX, NO ESOPHAGITIS 10/25/2006  . DIVERTICULOSIS OF COLON 10/25/2006  . Osteoarthritis 10/25/2006  . CERVICAL SPINE DISORDER, NOS 10/25/2006    Sumner Boast., PT 06/16/2019, 11:45 AM  [place] [address] [place], [place], [address] Phone: [phone]   Fax:  [phone]  Name: ALVETTA HIDROGO MRN: [id number] Date of Birth: February 01, 1941

## 2019-06-17 DIAGNOSIS — H903 Sensorineural hearing loss, bilateral: Secondary | ICD-10-CM | POA: Diagnosis not present

## 2019-06-19 ENCOUNTER — Ambulatory Visit: Payer: Medicare Other | Admitting: Physical Therapy

## 2019-06-19 ENCOUNTER — Other Ambulatory Visit: Payer: Self-pay

## 2019-06-19 DIAGNOSIS — I6990 Unspecified sequelae of unspecified cerebrovascular disease: Secondary | ICD-10-CM

## 2019-06-19 DIAGNOSIS — M6281 Muscle weakness (generalized): Secondary | ICD-10-CM | POA: Diagnosis not present

## 2019-06-19 DIAGNOSIS — M25562 Pain in left knee: Secondary | ICD-10-CM | POA: Diagnosis not present

## 2019-06-19 DIAGNOSIS — R262 Difficulty in walking, not elsewhere classified: Secondary | ICD-10-CM

## 2019-06-19 DIAGNOSIS — M25572 Pain in left ankle and joints of left foot: Secondary | ICD-10-CM | POA: Diagnosis not present

## 2019-06-19 DIAGNOSIS — M25662 Stiffness of left knee, not elsewhere classified: Secondary | ICD-10-CM | POA: Diagnosis not present

## 2019-06-19 NOTE — Therapy (Signed)
[address] [place], [place], [address] Phone: [phone]   Fax:  [phone] Progress Note Reporting Period 05/23/19 to 06/19/19 for visits 31-40  See note below for Objective Data and Assessment of Progress/Goals.      Physical Therapy Treatment  Patient Details  Name: Karen Jackson MRN: [id number] Date of Birth: 1941/06/15 Referring Provider (PT): Lucey   Encounter Date: 06/19/2019  PT End of Session - 06/19/19 1146    Visit Number  40    Date for PT Re-Evaluation  07/10/19    PT Start Time  8938    PT Stop Time  1140    PT Time Calculation (min)  48 min       Past Medical History:  Diagnosis Date  . Acute cystitis without hematuria   . Acute diastolic CHF (congestive heart failure) ([place])   . Arthritis   . Dyspnea   . Dysrhythmia   . Fever of unknown origin 03/19/2017  . Hyperlipidemia   . Hypertension    denies at preop  . Multifocal pneumonia   . Neuromuscular disorder ([place])    neuropathy left arm and foot  . Osteopenia   . Paralysis ([place])    partial left side from CVA   . Persistent atrial fibrillation ([place])   . PONV (postoperative nausea and vomiting)   . Pre-diabetes   . Stroke [facility]) 2013   hemmorahgic    Past Surgical History:  Procedure Laterality Date  . ANKLE SURGERY    . APPENDECTOMY    . CHOLECYSTECTOMY    . HERNIA REPAIR     Esophagus  . JOINT REPLACEMENT     total- right partial- left  . MASTECTOMY PARTIAL / LUMPECTOMY  2012   left  . ORIF ANKLE FRACTURE Left 07/20/2018   Procedure: OPEN REDUCTION INTERNAL FIXATION (ORIF) ANKLE FRACTURE;  Surgeon: Wylene Simmer, MD;  Location: [facility];  Service: Orthopedics;  Laterality: Left;  . TOTAL KNEE ARTHROPLASTY Left 01/27/2019   Procedure: TOTAL KNEE ARTHROPLASTY;  Surgeon: Vickey Huger, MD;  [facility];  Service: Orthopedics;  Laterality: Left;    There were no vitals filed for this visit.  Subjective Assessment  - 06/19/19 1142    Subjective  sore and stiff today- fels like it is going to rain    Currently in Pain?  Yes    Pain Score  4                        [facility] Adult PT Treatment/Exercise - 06/19/19 0001      Bed Mobility   Bed Mobility  Supine to Sit;Sit to Supine   SBA- much improved     Transfers   Comments  worked on transfer to/from w/c from car,nustep and mat table. Min A car and nustep and CGA to mat   cuing to get fully turned vs twisting to sit     Ambulation/Gait   Gait Comments  amb in gym 2 laps without w/c behind and with stufdent so pt can gain confidence with others. pt did well and was CGA until last 20 feet very fatigued and needed mod cuing with min A   amb 60 feet with transition surface and turns CGA     Knee/Hip Exercises: Aerobic   Nustep  level 6 x 7 minutes'               PT Short Term Goals -  03/20/19 1140      PT SHORT TERM GOAL #1   Title  independent with initial HEP    Status  Achieved        PT Long Term Goals - 06/16/19 1144      PT LONG TERM GOAL #3   Title  walk 100 feet with SBA    Status  Partially Met      PT LONG TERM GOAL #4   Title  transfer independently with set up    Status  Partially Met            Plan - 06/19/19 1147    Clinical Impression Statement  pt did very well today. focus on increasing func independance today with sit/supine,transfers and gait and utilizing student so pt can get comfortable with outs as she gets fearful.    PT Treatment/Interventions  ADLs/Self Care Home Management;Cryotherapy;Electrical Stimulation;Iontophoresis 21m/ml Dexamethasone;Moist Heat;Ultrasound;Gait training;Balance training;Therapeutic exercise;Therapeutic activities;Functional mobility training;Patient/family education;Manual techniques;Dry needling    PT Next Visit Plan  continue to work on her function and independence       Patient will benefit from skilled therapeutic intervention in order to improve the  following deficits and impairments:  Abnormal gait, Decreased coordination, Decreased range of motion, Difficulty walking, Cardiopulmonary status limiting activity, Decreased endurance, Increased muscle spasms, Impaired UE functional use, Decreased activity tolerance, Pain, Decreased balance, Postural dysfunction, Decreased strength, Decreased mobility  Visit Diagnosis: Difficulty in walking, not elsewhere classified  Muscle weakness (generalized)  Late effects of CVA (cerebrovascular accident)     Problem List Patient Active Problem List   Diagnosis Date Noted  . S/P total knee replacement 01/27/2019  . Recurrent left knee instability 07/05/2018  . Respiratory failure with hypoxia ([place] 08/30/2017  . Hypoxemia   . Heart failure with preserved ejection fraction ([place], Grade 3 diastolic dysfunction 038/25/0539 . PAF (paroxysmal atrial fibrillation) ([place]   . Dyspnea 03/19/2017  . Encounter for preventive health examination 02/17/2016  . Sensorineural hearing loss (SNHL), bilateral 01/26/2016  . Morbid obesity ([place] 06/17/2015  . Hypomagnesemia 04/24/2014  . Hemiparesis affecting left side as late effect of cerebrovascular accident ([facility] 04/24/2014  . Nontraumatic cerebral hemorrhage ([place] 04/30/2012  . DM (diabetes mellitus) with complications ([place] 076/73/4193 . OSTEOPENIA 01/21/2009  . UNSPECIFIED VITAMIN D DEFICIENCY 11/19/2007  . ESSENTIAL HYPERTENSION, BENIGN 11/19/2007  . HYPERCHOLESTEROLEMIA 10/25/2006  . GASTROESOPHAGEAL REFLUX, NO ESOPHAGITIS 10/25/2006  . DIVERTICULOSIS OF COLON 10/25/2006  . Osteoarthritis 10/25/2006  . CERVICAL SPINE DISORDER, NOS 10/25/2006    ,ANGIE PTA 06/19/2019, 11:49 AM  [address] [address]Phone: [phone]  Fax:  [phone] Name: Karen FRIGONMRN: [id number] of Birth: 110-Apr-1942

## 2019-06-23 ENCOUNTER — Other Ambulatory Visit: Payer: Self-pay

## 2019-06-23 ENCOUNTER — Ambulatory Visit: Payer: Medicare Other | Admitting: Physical Therapy

## 2019-06-23 ENCOUNTER — Encounter: Payer: Self-pay | Admitting: Physical Therapy

## 2019-06-23 DIAGNOSIS — M25662 Stiffness of left knee, not elsewhere classified: Secondary | ICD-10-CM | POA: Diagnosis not present

## 2019-06-23 DIAGNOSIS — R262 Difficulty in walking, not elsewhere classified: Secondary | ICD-10-CM

## 2019-06-23 DIAGNOSIS — M25572 Pain in left ankle and joints of left foot: Secondary | ICD-10-CM

## 2019-06-23 DIAGNOSIS — I69354 Hemiplegia and hemiparesis following cerebral infarction affecting left non-dominant side: Secondary | ICD-10-CM

## 2019-06-23 DIAGNOSIS — M6281 Muscle weakness (generalized): Secondary | ICD-10-CM

## 2019-06-23 DIAGNOSIS — I6990 Unspecified sequelae of unspecified cerebrovascular disease: Secondary | ICD-10-CM | POA: Diagnosis not present

## 2019-06-23 DIAGNOSIS — M25562 Pain in left knee: Secondary | ICD-10-CM | POA: Diagnosis not present

## 2019-06-23 NOTE — Therapy (Signed)
[address] [place], [place], [address] Phone: [phone]   Fax:  [phone]  Physical Therapy Treatment  Patient Details  Name: Karen Jackson MRN: [id number] Date of Birth: [birth year] Referring Provider (PT): Lucey   Encounter Date: 06/23/2019  PT End of Session - 06/23/19 1200    Visit Number  41    Date for PT Re-Evaluation  07/10/19    PT Start Time  1055    PT Stop Time  1140    PT Time Calculation (min)  45 min    Activity Tolerance  Patient tolerated treatment well    Behavior During Therapy  [facility] for tasks assessed/performed       Past Medical History:  Diagnosis Date  . Acute cystitis without hematuria   . Acute diastolic CHF (congestive heart failure) ([place])   . Arthritis   . Dyspnea   . Dysrhythmia   . Fever of unknown origin 03/19/2017  . Hyperlipidemia   . Hypertension    denies at preop  . Multifocal pneumonia   . Neuromuscular disorder ([place])    neuropathy left arm and foot  . Osteopenia   . Paralysis ([place])    partial left side from CVA   . Persistent atrial fibrillation ([place])   . PONV (postoperative nausea and vomiting)   . Pre-diabetes   . Stroke [facility]) 2013   hemmorahgic    Past Surgical History:  Procedure Laterality Date  . ANKLE SURGERY    . APPENDECTOMY    . CHOLECYSTECTOMY    . HERNIA REPAIR     Esophagus  . JOINT REPLACEMENT     total- right partial- left  . MASTECTOMY PARTIAL / LUMPECTOMY  2012   left  . ORIF ANKLE FRACTURE Left 07/20/2018   Procedure: OPEN REDUCTION INTERNAL FIXATION (ORIF) ANKLE FRACTURE;  Surgeon: Wylene Simmer, MD;  Location: [facility];  Service: Orthopedics;  Laterality: Left;  . TOTAL KNEE ARTHROPLASTY Left 01/27/2019   Procedure: TOTAL KNEE ARTHROPLASTY;  Surgeon: Vickey Huger, MD;  [facility];  Service: Orthopedics;  Laterality: Left;    There were no vitals filed for this visit.  Subjective Assessment - 06/23/19 1102     Subjective  sore today but did okay over the weekend    Currently in Pain?  Yes    Pain Score  3     Pain Location  Knee    Pain Orientation  Left    Aggravating Factors   weather and weight bearing                       [facility] Adult PT Treatment/Exercise - 06/23/19 0001      Ambulation/Gait   Gait Comments  with FWW, some cues for steps, with all oft the transfers we did today she needed less cues to get fully turned and not plop down.      High Level Balance   High Level Balance Comments  standing ball taps backs, some assist to corrects and a lot of cues to get her weight forward as she continued to fall backward      Knee/Hip Exercises: Machines for Strengthening   Cybex Knee Extension  5# 3x10    Cybex Knee Flexion  25# 2x10      Manual Therapy   Manual Therapy  Passive ROM    Manual therapy comments  PROM of the left shoulder, approximation of the fingers with motions to  stimulate increased function, scar mobilization, ankle ROM and approximation    Passive ROM  motion of the knee and then with pressure through the ankle , this seems to help her pain levels               PT Short Term Goals - 03/20/19 1140      PT SHORT TERM GOAL #1   Title  independent with initial HEP    Status  Achieved        PT Long Term Goals - 06/16/19 1144      PT LONG TERM GOAL #3   Title  walk 100 feet with SBA    Status  Partially Met      PT LONG TERM GOAL #4   Title  transfer independently with set up    Status  Partially Met            Plan - 06/23/19 1200    Clinical Impression Statement  Pt did much better with her transfers today, less cues for safety and to get completely turned around,  She still has limitations due to her own fear an danxiety but seems to be getting more and more confident    PT Next Visit Plan  continue to work on her function and independence    Consulted and Agree with Plan of Care  Patient       Patient will benefit from  skilled therapeutic intervention in order to improve the following deficits and impairments:  Abnormal gait, Decreased coordination, Decreased range of motion, Difficulty walking, Cardiopulmonary status limiting activity, Decreased endurance, Increased muscle spasms, Impaired UE functional use, Decreased activity tolerance, Pain, Decreased balance, Postural dysfunction, Decreased strength, Decreased mobility  Visit Diagnosis: Difficulty in walking, not elsewhere classified  Muscle weakness (generalized)  Late effects of CVA (cerebrovascular accident)  Stiffness of left knee, not elsewhere classified  Pain in left ankle and joints of left foot  Acute pain of left knee  Hemiplegia and hemiparesis following cerebral infarction affecting left non-dominant side [facility])     Problem List Patient Active Problem List   Diagnosis Date Noted  . S/P total knee replacement 01/27/2019  . Recurrent left knee instability 07/05/2018  . Respiratory failure with hypoxia ([place]) 08/30/2017  . Hypoxemia   . Heart failure with preserved ejection fraction ([place]), Grade 3 diastolic dysfunction 62/86/3817  . PAF (paroxysmal atrial fibrillation) ([place])   . Dyspnea 03/19/2017  . Encounter for preventive health examination 02/17/2016  . Sensorineural hearing loss (SNHL), bilateral 01/26/2016  . Morbid obesity ([place]) 06/17/2015  . Hypomagnesemia 04/24/2014  . Hemiparesis affecting left side as late effect of cerebrovascular accident ([place]) 04/24/2014  . Nontraumatic cerebral hemorrhage ([place]) 04/30/2012  . DM (diabetes mellitus) with complications ([place]) 71/16/5790  . OSTEOPENIA 01/21/2009  . UNSPECIFIED VITAMIN D DEFICIENCY 11/19/2007  . ESSENTIAL HYPERTENSION, BENIGN 11/19/2007  . HYPERCHOLESTEROLEMIA 10/25/2006  . GASTROESOPHAGEAL REFLUX, NO ESOPHAGITIS 10/25/2006  . DIVERTICULOSIS OF COLON 10/25/2006  . Osteoarthritis 10/25/2006  . CERVICAL SPINE DISORDER, NOS 10/25/2006    Sumner Boast.,  PT 06/23/2019, 12:04 PM  [address] [place], [place], [address] Phone: [phone]   Fax:  [phone]  Name: MAELEIGH BUSCHMAN MRN: [id number] Date of Birth: [birth year]

## 2019-06-26 ENCOUNTER — Encounter: Payer: Self-pay | Admitting: Physical Therapy

## 2019-06-26 ENCOUNTER — Other Ambulatory Visit: Payer: Self-pay

## 2019-06-26 ENCOUNTER — Ambulatory Visit: Payer: Medicare Other | Admitting: Physical Therapy

## 2019-06-26 DIAGNOSIS — M25562 Pain in left knee: Secondary | ICD-10-CM

## 2019-06-26 DIAGNOSIS — R262 Difficulty in walking, not elsewhere classified: Secondary | ICD-10-CM

## 2019-06-26 DIAGNOSIS — I69354 Hemiplegia and hemiparesis following cerebral infarction affecting left non-dominant side: Secondary | ICD-10-CM

## 2019-06-26 DIAGNOSIS — I6990 Unspecified sequelae of unspecified cerebrovascular disease: Secondary | ICD-10-CM | POA: Diagnosis not present

## 2019-06-26 DIAGNOSIS — M25662 Stiffness of left knee, not elsewhere classified: Secondary | ICD-10-CM

## 2019-06-26 DIAGNOSIS — M6281 Muscle weakness (generalized): Secondary | ICD-10-CM | POA: Diagnosis not present

## 2019-06-26 DIAGNOSIS — M25572 Pain in left ankle and joints of left foot: Secondary | ICD-10-CM | POA: Diagnosis not present

## 2019-06-26 NOTE — Therapy (Signed)
[address] [place], [place], [address] Phone: [phone]   Fax:  [phone]  Physical Therapy Treatment  Patient Details  Name: Karen Jackson MRN: [id number] Date of Birth: 1940/10/21 Referring Provider (PT): Lucey   Encounter Date: 06/26/2019  PT End of Session - 06/26/19 1142    Visit Number  42    Date for PT Re-Evaluation  07/10/19    PT Start Time  1057    PT Stop Time  1142    PT Time Calculation (min)  45 min    Activity Tolerance  Patient tolerated treatment well    Behavior During Therapy  [facility] for tasks assessed/performed       Past Medical History:  Diagnosis Date  . Acute cystitis without hematuria   . Acute diastolic CHF (congestive heart failure) ([place])   . Arthritis   . Dyspnea   . Dysrhythmia   . Fever of unknown origin 03/19/2017  . Hyperlipidemia   . Hypertension    denies at preop  . Multifocal pneumonia   . Neuromuscular disorder ([place])    neuropathy left arm and foot  . Osteopenia   . Paralysis ([place])    partial left side from CVA   . Persistent atrial fibrillation ([place])   . PONV (postoperative nausea and vomiting)   . Pre-diabetes   . Stroke [facility]) 2013   hemmorahgic    Past Surgical History:  Procedure Laterality Date  . ANKLE SURGERY    . APPENDECTOMY    . CHOLECYSTECTOMY    . HERNIA REPAIR     Esophagus  . JOINT REPLACEMENT     total- right partial- left  . MASTECTOMY PARTIAL / LUMPECTOMY  2012   left  . ORIF ANKLE FRACTURE Left 07/20/2018   Procedure: OPEN REDUCTION INTERNAL FIXATION (ORIF) ANKLE FRACTURE;  Surgeon: Wylene Simmer, MD;  Location: [facility];  Service: Orthopedics;  Laterality: Left;  . TOTAL KNEE ARTHROPLASTY Left 01/27/2019   Procedure: TOTAL KNEE ARTHROPLASTY;  Surgeon: Vickey Huger, MD;  [facility];  Service: Orthopedics;  Laterality: Left;    There were no vitals filed for this visit.  Subjective Assessment - 06/26/19 1104    Subjective  Doing okay but the rain is starting to get my joints and bones    Currently in Pain?  Yes    Pain Score  4     Pain Location  Knee    Aggravating Factors   rain                       [facility] Adult PT Treatment/Exercise - 06/26/19 0001      Ambulation/Gait   Gait Comments  FWW 30 feet no assist, 100' x 2 with CGA and w/c following      Knee/Hip Exercises: Standing   Other Standing Knee Exercises  toe touches on 6inch with 3#, marching 3#, hip flex 3#, hip abd 3#      Manual Therapy   Manual Therapy  Passive ROM    Manual therapy comments  PROM of the left shoulder, approximation of the fingers with motions to stimulate increased function, scar mobilization, ankle ROM and approximation    Passive ROM  motion of the knee and then with pressure through the ankle , this seems to help her pain levels               PT Short Term Goals - 03/20/19 1140  PT SHORT TERM GOAL #1   Title  independent with initial HEP    Status  Achieved        PT Long Term Goals - 06/26/19 1144      PT LONG TERM GOAL #1   Title  walk with hand hold assist x 100 feet    Status  Achieved      PT LONG TERM GOAL #2   Title  increased right LE strength to 4-/5    Status  Partially Met      PT LONG TERM GOAL #3   Title  walk 100 feet with SBA    Status  Partially Met      PT LONG TERM GOAL #4   Title  transfer independently with set up    Status  Partially Met            Plan - 06/26/19 1142    Clinical Impression Statement  Patient had a little more pain today due to the weather, she still is fearful of new things and things that challenge her, tried 6" toe touches with weight on ankles, this is scary for her and she needs a lot of encouragement, needed some bracing of the left knee with this due to the weakness.  Still fearful of doing things on her own    PT Next Visit Plan  continue to work on her function and independence    Consulted and Agree with  Plan of Care  Patient       Patient will benefit from skilled therapeutic intervention in order to improve the following deficits and impairments:  Abnormal gait, Decreased coordination, Decreased range of motion, Difficulty walking, Cardiopulmonary status limiting activity, Decreased endurance, Increased muscle spasms, Impaired UE functional use, Decreased activity tolerance, Pain, Decreased balance, Postural dysfunction, Decreased strength, Decreased mobility  Visit Diagnosis: Difficulty in walking, not elsewhere classified  Muscle weakness (generalized)  Late effects of CVA (cerebrovascular accident)  Stiffness of left knee, not elsewhere classified  Pain in left ankle and joints of left foot  Acute pain of left knee  Hemiplegia and hemiparesis following cerebral infarction affecting left non-dominant side (H[facility])     Problem List Patient Active Problem List   Diagnosis Date Noted  . S/P total knee replacement 01/27/2019  . Recurrent left knee instability 07/05/2018  . Respiratory failure with hypoxia ([facility]) 08/30/2017  . Hypoxemia   . Heart failure with preserved ejection fraction ([facility]), Grade 3 diastolic dysfunction 03/26/2017  . PAF (paroxysmal atrial fibrillation) (H[facility])   . Dyspnea 03/19/2017  . Encounter for preventive health examination 02/17/2016  . Sensorineural hearing loss (SNHL), bilateral 01/26/2016  . Morbid obesity (H[facility]) 06/17/2015  . Hypomagnesemia 04/24/2014  . Hemiparesis affecting left side as late effect of cerebrovascular accident (H[facility]) 04/24/2014  . Nontraumatic cerebral hemorrhage (H[facility]) 04/30/2012  . DM (diabetes mellitus) with complications (H[facility]) 03/04/2010  . OSTEOPENIA 01/21/2009  . UNSPECIFIED VITAMIN D DEFICIENCY 11/19/2007  . ESSENTIAL HYPERTENSION, BENIGN 11/19/2007  . HYPERCHOLESTEROLEMIA 10/25/2006  . GASTROESOPHAGEAL REFLUX, NO ESOPHAGITIS 10/25/2006  . DIVERTICULOSIS OF COLON 10/25/2006  . Osteoarthritis 10/25/2006  . CERVICAL SPINE  DISORDER, NOS 10/25/2006    , W., PT 06/26/2019, 11:45 AM  [facility] [address] [place], [place], [address] Phone: [phone]   Fax:  [phone]  Name: Karen Jackson MRN: [id number] Date of Birth: 01/08/1941   

## 2019-07-01 ENCOUNTER — Ambulatory Visit: Payer: Medicare Other | Attending: Orthopedic Surgery | Admitting: Physical Therapy

## 2019-07-01 ENCOUNTER — Encounter: Payer: Self-pay | Admitting: Physical Therapy

## 2019-07-01 ENCOUNTER — Other Ambulatory Visit: Payer: Self-pay

## 2019-07-01 DIAGNOSIS — M6281 Muscle weakness (generalized): Secondary | ICD-10-CM | POA: Insufficient documentation

## 2019-07-01 DIAGNOSIS — R262 Difficulty in walking, not elsewhere classified: Secondary | ICD-10-CM | POA: Insufficient documentation

## 2019-07-01 DIAGNOSIS — M25572 Pain in left ankle and joints of left foot: Secondary | ICD-10-CM | POA: Insufficient documentation

## 2019-07-01 DIAGNOSIS — I6990 Unspecified sequelae of unspecified cerebrovascular disease: Secondary | ICD-10-CM | POA: Insufficient documentation

## 2019-07-01 DIAGNOSIS — M25662 Stiffness of left knee, not elsewhere classified: Secondary | ICD-10-CM | POA: Insufficient documentation

## 2019-07-01 DIAGNOSIS — M25562 Pain in left knee: Secondary | ICD-10-CM | POA: Diagnosis not present

## 2019-07-01 DIAGNOSIS — I69354 Hemiplegia and hemiparesis following cerebral infarction affecting left non-dominant side: Secondary | ICD-10-CM | POA: Diagnosis not present

## 2019-07-01 NOTE — Therapy (Signed)
[address] [place], [place], [address] Phone: [phone]   Fax:  [phone]  Physical Therapy Treatment  Patient Details  Name: Karen Jackson MRN: [id number] Date of Birth: 10-01-1940 Referring Provider (PT): Lucey   Encounter Date: 07/01/2019  PT End of Session - 07/01/19 1144    Visit Number  43    Date for PT Re-Evaluation  07/10/19    PT Start Time  1057    PT Stop Time  1143    PT Time Calculation (min)  46 min    Activity Tolerance  Patient tolerated treatment well;Patient limited by fatigue    Behavior During Therapy  [facility] for tasks assessed/performed       Past Medical History:  Diagnosis Date  . Acute cystitis without hematuria   . Acute diastolic CHF (congestive heart failure) ([place])   . Arthritis   . Dyspnea   . Dysrhythmia   . Fever of unknown origin 03/19/2017  . Hyperlipidemia   . Hypertension    denies at preop  . Multifocal pneumonia   . Neuromuscular disorder ([place])    neuropathy left arm and foot  . Osteopenia   . Paralysis ([place])    partial left side from CVA   . Persistent atrial fibrillation ([place])   . PONV (postoperative nausea and vomiting)   . Pre-diabetes   . Stroke [facility]) 2013   hemmorahgic    Past Surgical History:  Procedure Laterality Date  . ANKLE SURGERY    . APPENDECTOMY    . CHOLECYSTECTOMY    . HERNIA REPAIR     Esophagus  . JOINT REPLACEMENT     total- right partial- left  . MASTECTOMY PARTIAL / LUMPECTOMY  2012   left  . ORIF ANKLE FRACTURE Left 07/20/2018   Procedure: OPEN REDUCTION INTERNAL FIXATION (ORIF) ANKLE FRACTURE;  Surgeon: Wylene Simmer, MD;  Location: [facility];  Service: Orthopedics;  Laterality: Left;  . TOTAL KNEE ARTHROPLASTY Left 01/27/2019   Procedure: TOTAL KNEE ARTHROPLASTY;  Surgeon: Vickey Huger, MD;  [facility];  Service: Orthopedics;  Laterality: Left;    There were no vitals filed for this visit.  Subjective Assessment -  07/01/19 1114    Subjective  Reports stiff but doing well at home    Currently in Pain?  No/denies                       [facility] Adult PT Treatment/Exercise - 07/01/19 0001      Ambulation/Gait   Gait Comments  gait in clinc with HHA, gait from mat table x 100 ' with FWW and CGA, gait then after rest x 110' but a lot of encouragement as she c/o very tired and needing to sit      Knee/Hip Exercises: Machines for Strengthening   Cybex Knee Extension  5# 3x10    Cybex Knee Flexion  25# 2x10      Knee/Hip Exercises: Standing   Other Standing Knee Exercises  toe touches on 6inch with 3#, marching 3#, hip flex 3#, hip abd 3#      Knee/Hip Exercises: Seated   Other Seated Knee/Hip Exercises  ankle green tband exercises      Manual Therapy   Manual Therapy  Passive ROM    Manual therapy comments  PROM of the left shoulder, approximation of the fingers with motions to stimulate increased function, scar mobilization, ankle ROM and approximation  Passive ROM  motion of the knee and then with pressure through the ankle , this seems to help her pain levels               PT Short Term Goals - 03/20/19 1140      PT SHORT TERM GOAL #1   Title  independent with initial HEP    Status  Achieved        PT Long Term Goals - 07/01/19 1146      PT LONG TERM GOAL #1   Title  walk with hand hold assist x 100 feet    Status  Achieved      PT LONG TERM GOAL #3   Title  walk 100 feet with SBA    Status  Partially Met      PT LONG TERM GOAL #4   Title  transfer independently with set up    Status  Partially Met            Plan - 07/01/19 1145    Clinical Impression Statement  transfers today required min to mod A from sitting, she seemed to be leaning back much more, she had a very difficult time getting on the leg extension and curl machine and then the exercises she reports really wore her out and her legs were "shaky", wabted to sit multiple times with the  walking but continued with a lot of encouragement    PT Next Visit Plan  continue to work on her function and independence    Consulted and Agree with Plan of Care  Patient       Patient will benefit from skilled therapeutic intervention in order to improve the following deficits and impairments:  Abnormal gait, Decreased coordination, Decreased range of motion, Difficulty walking, Cardiopulmonary status limiting activity, Decreased endurance, Increased muscle spasms, Impaired UE functional use, Decreased activity tolerance, Pain, Decreased balance, Postural dysfunction, Decreased strength, Decreased mobility  Visit Diagnosis: Difficulty in walking, not elsewhere classified  Muscle weakness (generalized)  Late effects of CVA (cerebrovascular accident)  Stiffness of left knee, not elsewhere classified  Pain in left ankle and joints of left foot  Acute pain of left knee  Hemiplegia and hemiparesis following cerebral infarction affecting left non-dominant side [facility])     Problem List Patient Active Problem List   Diagnosis Date Noted  . S/P total knee replacement 01/27/2019  . Recurrent left knee instability 07/05/2018  . Respiratory failure with hypoxia ([place]) 08/30/2017  . Hypoxemia   . Heart failure with preserved ejection fraction ([place]), Grade 3 diastolic dysfunction 76/81/1572  . PAF (paroxysmal atrial fibrillation) ([place])   . Dyspnea 03/19/2017  . Encounter for preventive health examination 02/17/2016  . Sensorineural hearing loss (SNHL), bilateral 01/26/2016  . Morbid obesity ([place]) 06/17/2015  . Hypomagnesemia 04/24/2014  . Hemiparesis affecting left side as late effect of cerebrovascular accident ([place]) 04/24/2014  . Nontraumatic cerebral hemorrhage ([place]) 04/30/2012  . DM (diabetes mellitus) with complications ([place]) 62/10/5595  . OSTEOPENIA 01/21/2009  . UNSPECIFIED VITAMIN D DEFICIENCY 11/19/2007  . ESSENTIAL HYPERTENSION, BENIGN 11/19/2007  . HYPERCHOLESTEROLEMIA  10/25/2006  . GASTROESOPHAGEAL REFLUX, NO ESOPHAGITIS 10/25/2006  . DIVERTICULOSIS OF COLON 10/25/2006  . Osteoarthritis 10/25/2006  . CERVICAL SPINE DISORDER, NOS 10/25/2006    Sumner Boast., PT 07/01/2019, 11:47 AM  [address] [place], [place], [address] Phone: [phone]   Fax:  [phone]  Name: Karen Jackson MRN: [id number] Date of Birth:  06/04/1941

## 2019-07-04 ENCOUNTER — Encounter: Payer: Self-pay | Admitting: Physical Therapy

## 2019-07-04 ENCOUNTER — Other Ambulatory Visit: Payer: Self-pay

## 2019-07-04 ENCOUNTER — Ambulatory Visit: Payer: Medicare Other | Admitting: Physical Therapy

## 2019-07-04 DIAGNOSIS — R262 Difficulty in walking, not elsewhere classified: Secondary | ICD-10-CM

## 2019-07-04 DIAGNOSIS — M25572 Pain in left ankle and joints of left foot: Secondary | ICD-10-CM | POA: Diagnosis not present

## 2019-07-04 DIAGNOSIS — M6281 Muscle weakness (generalized): Secondary | ICD-10-CM

## 2019-07-04 DIAGNOSIS — M25562 Pain in left knee: Secondary | ICD-10-CM

## 2019-07-04 DIAGNOSIS — I6930 Unspecified sequelae of cerebral infarction: Secondary | ICD-10-CM

## 2019-07-04 DIAGNOSIS — I6990 Unspecified sequelae of unspecified cerebrovascular disease: Secondary | ICD-10-CM

## 2019-07-04 DIAGNOSIS — M25662 Stiffness of left knee, not elsewhere classified: Secondary | ICD-10-CM

## 2019-07-04 DIAGNOSIS — I69354 Hemiplegia and hemiparesis following cerebral infarction affecting left non-dominant side: Secondary | ICD-10-CM

## 2019-07-04 NOTE — Therapy (Signed)
[place] [address] [place], [place], [address] Phone: [phone]   Fax:  [phone]  Physical Therapy Treatment  Patient Details  Name: Karen Jackson MRN: [id number] Date of Birth: 07-Dec-1940 Referring Provider (PT): Lucey   Encounter Date: 07/04/2019  PT End of Session - 07/04/19 1144    Visit Number  67    PT Start Time  3151    PT Stop Time  7616    PT Time Calculation (min)  47 min    Activity Tolerance  Patient tolerated treatment well    Behavior During Therapy  [facility] for tasks assessed/performed       Past Medical History:  Diagnosis Date  . Acute cystitis without hematuria   . Acute diastolic CHF (congestive heart failure) ([place])   . Arthritis   . Dyspnea   . Dysrhythmia   . Fever of unknown origin 03/19/2017  . Hyperlipidemia   . Hypertension    denies at preop  . Multifocal pneumonia   . Neuromuscular disorder ([place])    neuropathy left arm and foot  . Osteopenia   . Paralysis ([place])    partial left side from CVA   . Persistent atrial fibrillation ([place])   . PONV (postoperative nausea and vomiting)   . Pre-diabetes   . Stroke [facility]) 2013   hemmorahgic    Past Surgical History:  Procedure Laterality Date  . ANKLE SURGERY    . APPENDECTOMY    . CHOLECYSTECTOMY    . HERNIA REPAIR     Esophagus  . JOINT REPLACEMENT     total- right partial- left  . MASTECTOMY PARTIAL / LUMPECTOMY  2012   left  . ORIF ANKLE FRACTURE Left 07/20/2018   Procedure: OPEN REDUCTION INTERNAL FIXATION (ORIF) ANKLE FRACTURE;  Surgeon: Wylene Simmer, MD;  Location: [facility];  Service: Orthopedics;  Laterality: Left;  . TOTAL KNEE ARTHROPLASTY Left 01/27/2019   Procedure: TOTAL KNEE ARTHROPLASTY;  Surgeon: Vickey Huger, MD;  [facility];  Service: Orthopedics;  Laterality: Left;    There were no vitals filed for this visit.  Subjective Assessment - 07/04/19 1104    Subjective  Reports she got new shoes, trying  them for the first time today, c/o left hand tightness    Currently in Pain?  No/denies                       [facility] Adult PT Treatment/Exercise - 07/04/19 0001      Ambulation/Gait   Gait Comments  with FWW, after the leg extension she is very fatigued and tends to have the knee buckle and needs close CGA, did 100' x 2      Knee/Hip Exercises: Machines for Strengthening   Cybex Knee Extension  5# 3x10    Cybex Knee Flexion  20#3x10      Knee/Hip Exercises: Seated   Other Seated Knee/Hip Exercises  ankle green tband exercises    Other Seated Knee/Hip Exercises  bosu behind doing partial sit ups      Manual Therapy   Manual Therapy  Passive ROM    Manual therapy comments  PROM of the left shoulder, approximation of the fingers with motions to stimulate increased function, scar mobilization, ankle ROM and approximation    Passive ROM  motion of the knee and then with pressure through the ankle , this seems to help her pain levels  PT Short Term Goals - 03/20/19 1140      PT SHORT TERM GOAL #1   Title  independent with initial HEP    Status  Achieved        PT Long Term Goals - 07/04/19 1148      PT LONG TERM GOAL #1   Title  walk with hand hold assist x 100 feet    Status  Achieved      PT LONG TERM GOAL #2   Title  increased right LE strength to 4-/5    Status  Partially Met      PT LONG TERM GOAL #3   Title  walk 100 feet with SBA    Status  Partially Met      PT LONG TERM GOAL #4   Title  transfer independently with set up    Status  Partially Met            Plan - 07/04/19 1145    Clinical Impression Statement  Patient has difficulty with the left knee after the leg extensions, if she is not cued she will not use the quads and the knee will buckle.  She is tolerating the exercises better at times she still fatigues, she has started doing some exercises at home and reports that she will try to start some walking at home  next week    PT Next Visit Plan  continue to work on her function and independence    Consulted and Agree with Plan of Care  Patient       Patient will benefit from skilled therapeutic intervention in order to improve the following deficits and impairments:  Abnormal gait, Decreased coordination, Decreased range of motion, Difficulty walking, Cardiopulmonary status limiting activity, Decreased endurance, Increased muscle spasms, Impaired UE functional use, Decreased activity tolerance, Pain, Decreased balance, Postural dysfunction, Decreased strength, Decreased mobility  Visit Diagnosis: Difficulty in walking, not elsewhere classified  Muscle weakness (generalized)  Late effects of CVA (cerebrovascular accident)  Stiffness of left knee, not elsewhere classified  Pain in left ankle and joints of left foot  Acute pain of left knee  Hemiplegia and hemiparesis following cerebral infarction affecting left non-dominant side [facility])     Problem List Patient Active Problem List   Diagnosis Date Noted  . S/P total knee replacement 01/27/2019  . Recurrent left knee instability 07/05/2018  . Respiratory failure with hypoxia ([place]) 08/30/2017  . Hypoxemia   . Heart failure with preserved ejection fraction ([place]), Grade 3 diastolic dysfunction 37/48/2707  . PAF (paroxysmal atrial fibrillation) ([place])   . Dyspnea 03/19/2017  . Encounter for preventive health examination 02/17/2016  . Sensorineural hearing loss (SNHL), bilateral 01/26/2016  . Morbid obesity ([place]) 06/17/2015  . Hypomagnesemia 04/24/2014  . Hemiparesis affecting left side as late effect of cerebrovascular accident ([place]) 04/24/2014  . Nontraumatic cerebral hemorrhage ([place]) 04/30/2012  . DM (diabetes mellitus) with complications ([place]) 86/75/4492  . OSTEOPENIA 01/21/2009  . UNSPECIFIED VITAMIN D DEFICIENCY 11/19/2007  . ESSENTIAL HYPERTENSION, BENIGN 11/19/2007  . HYPERCHOLESTEROLEMIA 10/25/2006  . GASTROESOPHAGEAL REFLUX, NO  ESOPHAGITIS 10/25/2006  . DIVERTICULOSIS OF COLON 10/25/2006  . Osteoarthritis 10/25/2006  . CERVICAL SPINE DISORDER, NOS 10/25/2006    Sumner Boast., PT 07/04/2019, 11:50 AM  [address] [place], [place], [address] Phone: [phone]   Fax:  [phone]  Name: NEOSHA SWITALSKI MRN: [id number] Date of Birth: 1941/06/19

## 2019-07-07 ENCOUNTER — Ambulatory Visit: Payer: Medicare Other | Admitting: Physical Therapy

## 2019-07-07 ENCOUNTER — Encounter: Payer: Self-pay | Admitting: Physical Therapy

## 2019-07-07 ENCOUNTER — Other Ambulatory Visit: Payer: Self-pay

## 2019-07-07 DIAGNOSIS — M6281 Muscle weakness (generalized): Secondary | ICD-10-CM

## 2019-07-07 DIAGNOSIS — I6990 Unspecified sequelae of unspecified cerebrovascular disease: Secondary | ICD-10-CM

## 2019-07-07 DIAGNOSIS — M25562 Pain in left knee: Secondary | ICD-10-CM | POA: Diagnosis not present

## 2019-07-07 DIAGNOSIS — M25572 Pain in left ankle and joints of left foot: Secondary | ICD-10-CM | POA: Diagnosis not present

## 2019-07-07 DIAGNOSIS — M25662 Stiffness of left knee, not elsewhere classified: Secondary | ICD-10-CM | POA: Diagnosis not present

## 2019-07-07 DIAGNOSIS — R262 Difficulty in walking, not elsewhere classified: Secondary | ICD-10-CM | POA: Diagnosis not present

## 2019-07-07 NOTE — Therapy (Signed)
[place], [place], [address] Phone: [phone]   Fax:  [phone]  Physical Therapy Treatment  Patient Details  Name: Karen Jackson MRN: [id number] Date of Birth: 1941-02-18 Referring Provider (PT): Lucey   Encounter Date: 07/07/2019  PT End of Session - 07/07/19 1149    Visit Number  45    Date for PT Re-Evaluation  07/10/19    PT Start Time  1055    PT Stop Time  1141    PT Time Calculation (min)  46 min    Activity Tolerance  Patient tolerated treatment well    Behavior During Therapy  [facility] for tasks assessed/performed       Past Medical History:  Diagnosis Date  . Acute cystitis without hematuria   . Acute diastolic CHF (congestive heart failure) ([place])   . Arthritis   . Dyspnea   . Dysrhythmia   . Fever of unknown origin 03/19/2017  . Hyperlipidemia   . Hypertension    denies at preop  . Multifocal pneumonia   . Neuromuscular disorder ([place])    neuropathy left arm and foot  . Osteopenia   . Paralysis ([place])    partial left side from CVA   . Persistent atrial fibrillation ([place])   . PONV (postoperative nausea and vomiting)   . Pre-diabetes   . Stroke [facility]) 2013   hemmorahgic    Past Surgical History:  Procedure Laterality Date  . ANKLE SURGERY    . APPENDECTOMY    . CHOLECYSTECTOMY    . HERNIA REPAIR     Esophagus  . JOINT REPLACEMENT     total- right partial- left  . MASTECTOMY PARTIAL / LUMPECTOMY  2012   left  . ORIF ANKLE FRACTURE Left 07/20/2018   Procedure: OPEN REDUCTION INTERNAL FIXATION (ORIF) ANKLE FRACTURE;  Surgeon: Wylene Simmer, MD;  Location: [facility];  Service: Orthopedics;  Laterality: Left;  . TOTAL KNEE ARTHROPLASTY Left 01/27/2019   Procedure: TOTAL KNEE ARTHROPLASTY;  Surgeon: Vickey Huger, MD;  [facility];  Service: Orthopedics;  Laterality: Left;    There were no vitals filed for this visit.  Subjective Assessment - 07/07/19 1101    Subjective  New shoes are well, reports that she is doing good    Currently in Pain?  Yes    Pain Score  7     Pain Location  Shoulder    Pain Orientation  Left                       [facility] Adult PT Treatment/Exercise - 07/07/19 0001      Ambulation/Gait   Gait Comments  with FWW CGA, did 100' x 2      Knee/Hip Exercises: Aerobic   Nustep  L5 x 25mn       Knee/Hip Exercises: Seated   Long Arc Quad  Strengthening;Left;10 reps;3 sets    LIllinois Tool WorksWeight  5 lbs.    Other Seated Knee/Hip Exercises  ankle green tband exercises    Other Seated Knee/Hip Exercises  bosu behind doing partial sit ups    Hamstring Curl  Strengthening;Both;2 sets;10 reps    Hamstring Limitations  green tband      Manual Therapy   Manual Therapy  Passive ROM    Manual therapy comments  PROM of the left shoulder, approximation of the fingers with motions to stimulate increased function, scar mobilization, ankle ROM and approximation  Passive ROM  motion of the knee and then with pressure through the ankle , this seems to help her pain levels               PT Short Term Goals - 03/20/19 1140      PT SHORT TERM GOAL #1   Title  independent with initial HEP    Status  Achieved        PT Long Term Goals - 07/04/19 1148      PT LONG TERM GOAL #1   Title  walk with hand hold assist x 100 feet    Status  Achieved      PT LONG TERM GOAL #2   Title  increased right LE strength to 4-/5    Status  Partially Met      PT LONG TERM GOAL #3   Title  walk 100 feet with SBA    Status  Partially Met      PT LONG TERM GOAL #4   Title  transfer independently with set up    Status  Partially Met            Plan - 07/07/19 1150    Clinical Impression Statement  Pt continues to fatigue with activity. She reports that she will start walking at home. Cues needed to squeeze L quat with LAQ. Cues also needed to go through full ROM with HS curls. Some increase in L shoulder pain  with small passive motions. Min to mod assist needed to transfer from sit to stand.    Comorbidities  CVA, TSR, right TKR, ankle replacement, HTN    Examination-Activity Limitations  Bathing;Bed Mobility;Lift;Squat;Stairs;Locomotion Level;Stand;Toileting;Transfers;Dressing    Stability/Clinical Decision Making  Evolving/Moderate complexity    Rehab Potential  Fair    PT Frequency  3x / week    PT Duration  4 weeks    PT Treatment/Interventions  ADLs/Self Care Home Management;Cryotherapy;Electrical Stimulation;Iontophoresis 36m/ml Dexamethasone;Moist Heat;Ultrasound;Gait training;Balance training;Therapeutic exercise;Therapeutic activities;Functional mobility training;Patient/family education;Manual techniques;Dry needling    PT Next Visit Plan  continue to work on her function and independence       Patient will benefit from skilled therapeutic intervention in order to improve the following deficits and impairments:  Abnormal gait, Decreased coordination, Decreased range of motion, Difficulty walking, Cardiopulmonary status limiting activity, Decreased endurance, Increased muscle spasms, Impaired UE functional use, Decreased activity tolerance, Pain, Decreased balance, Postural dysfunction, Decreased strength, Decreased mobility  Visit Diagnosis: Difficulty in walking, not elsewhere classified  Muscle weakness (generalized)  Late effects of CVA (cerebrovascular accident)     Problem List Patient Active Problem List   Diagnosis Date Noted  . S/P total knee replacement 01/27/2019  . Recurrent left knee instability 07/05/2018  . Respiratory failure with hypoxia ([place] 08/30/2017  . Hypoxemia   . Heart failure with preserved ejection fraction ([place], Grade 3 diastolic dysfunction 016/94/5038 . PAF (paroxysmal atrial fibrillation) ([place]   . Dyspnea 03/19/2017  . Encounter for preventive health examination 02/17/2016  . Sensorineural hearing loss (SNHL), bilateral 01/26/2016  . Morbid  obesity ([place] 06/17/2015  . Hypomagnesemia 04/24/2014  . Hemiparesis affecting left side as late effect of cerebrovascular accident ([place] 04/24/2014  . Nontraumatic cerebral hemorrhage ([place] 04/30/2012  . DM (diabetes mellitus) with complications ([place] 088/28/0034 . OSTEOPENIA 01/21/2009  . UNSPECIFIED VITAMIN D DEFICIENCY 11/19/2007  . ESSENTIAL HYPERTENSION, BENIGN 11/19/2007  . HYPERCHOLESTEROLEMIA 10/25/2006  . GASTROESOPHAGEAL REFLUX, NO ESOPHAGITIS 10/25/2006  . DIVERTICULOSIS OF COLON 10/25/2006  . Osteoarthritis 10/25/2006  .  CERVICAL SPINE DISORDER, NOS 10/25/2006    Scot Jun, PTA 07/07/2019, 11:55 AM  [place] [address] [place], [place], [address] Phone: [phone]   Fax:  [phone]  Name: Karen Jackson MRN: [id number] Date of Birth: 1941-08-13

## 2019-07-10 ENCOUNTER — Ambulatory Visit: Payer: Medicare Other | Admitting: Physical Therapy

## 2019-07-14 ENCOUNTER — Encounter: Payer: Self-pay | Admitting: Physical Therapy

## 2019-07-14 ENCOUNTER — Ambulatory Visit: Payer: Medicare Other | Admitting: Physical Therapy

## 2019-07-14 ENCOUNTER — Other Ambulatory Visit: Payer: Self-pay

## 2019-07-14 DIAGNOSIS — M25662 Stiffness of left knee, not elsewhere classified: Secondary | ICD-10-CM | POA: Diagnosis not present

## 2019-07-14 DIAGNOSIS — M25572 Pain in left ankle and joints of left foot: Secondary | ICD-10-CM

## 2019-07-14 DIAGNOSIS — I6990 Unspecified sequelae of unspecified cerebrovascular disease: Secondary | ICD-10-CM | POA: Diagnosis not present

## 2019-07-14 DIAGNOSIS — R262 Difficulty in walking, not elsewhere classified: Secondary | ICD-10-CM

## 2019-07-14 DIAGNOSIS — M25562 Pain in left knee: Secondary | ICD-10-CM | POA: Diagnosis not present

## 2019-07-14 DIAGNOSIS — I69354 Hemiplegia and hemiparesis following cerebral infarction affecting left non-dominant side: Secondary | ICD-10-CM

## 2019-07-14 DIAGNOSIS — M6281 Muscle weakness (generalized): Secondary | ICD-10-CM

## 2019-07-14 NOTE — Therapy (Signed)
[place] [address] [place], [place], [address] Phone: [phone]   Fax:  [phone]  Physical Therapy Treatment  Patient Details  Name: Karen Jackson MRN: [id number] Date of Birth: 07-24-1941 Referring Provider (PT): Lucey   Encounter Date: 07/14/2019  PT End of Session - 07/14/19 1151    Visit Number  65    Date for PT Re-Evaluation  07/10/19    PT Start Time  1056    PT Stop Time  1143    PT Time Calculation (min)  47 min    Activity Tolerance  Patient tolerated treatment well    Behavior During Therapy  [facility] for tasks assessed/performed       Past Medical History:  Diagnosis Date  . Acute cystitis without hematuria   . Acute diastolic CHF (congestive heart failure) ([place])   . Arthritis   . Dyspnea   . Dysrhythmia   . Fever of unknown origin 03/19/2017  . Hyperlipidemia   . Hypertension    denies at preop  . Multifocal pneumonia   . Neuromuscular disorder ([place])    neuropathy left arm and foot  . Osteopenia   . Paralysis ([place])    partial left side from CVA   . Persistent atrial fibrillation ([place])   . PONV (postoperative nausea and vomiting)   . Pre-diabetes   . Stroke [facility]) 2013   hemmorahgic    Past Surgical History:  Procedure Laterality Date  . ANKLE SURGERY    . APPENDECTOMY    . CHOLECYSTECTOMY    . HERNIA REPAIR     Esophagus  . JOINT REPLACEMENT     total- right partial- left  . MASTECTOMY PARTIAL / LUMPECTOMY  2012   left  . ORIF ANKLE FRACTURE Left 07/20/2018   Procedure: OPEN REDUCTION INTERNAL FIXATION (ORIF) ANKLE FRACTURE;  Surgeon: Wylene Simmer, MD;  Location: [facility];  Service: Orthopedics;  Laterality: Left;  . TOTAL KNEE ARTHROPLASTY Left 01/27/2019   Procedure: TOTAL KNEE ARTHROPLASTY;  Surgeon: Vickey Huger, MD;  [facility];  Service: Orthopedics;  Laterality: Left;    There were no vitals filed for this visit.  Subjective Assessment - 07/14/19 1104    Subjective  No problems over the weekend    Currently in Pain?  Yes    Pain Score  3     Pain Location  Knee    Pain Orientation  Left         [facility] PT Assessment - 07/14/19 0001      Transfers   Comments  worked on transfers if she has arm rests she can do most of the time with CGA, if no arm rests she really sruggles and will need Min A to get up,       Ambulation/Gait   Gait Comments  with FWW, worked on some negotiation of things turns, trnasfers and change of direction, she had some difficulty today with the left hand slipping, this caused her to grip harder and then to use her shoulder more causing some spasms and tightness in the upper trap and the wrist flexors                   [facility] Adult PT Treatment/Exercise - 07/14/19 0001      Knee/Hip Exercises: Stretches   Gastroc Stretch  3 reps;20 seconds;Left      Knee/Hip Exercises: Aerobic   Nustep  L6 x 85mn  Knee/Hip Exercises: Standing   Other Standing Knee Exercises  toe touches on 6inch with 3#, marching 3#, hip flex 3#, hip abd 3#      Knee/Hip Exercises: Seated   Other Seated Knee/Hip Exercises  ankle green tband exercises, blue tband press down      Manual Therapy   Manual Therapy  Passive ROM    Manual therapy comments  PROM of the left shoulder, approximation of the fingers with motions to stimulate increased function, scar mobilization, ankle ROM and approximation    Passive ROM  motion of the knee and then with pressure through the ankle , this seems to help her pain levels               PT Short Term Goals - 03/20/19 1140      PT SHORT TERM GOAL #1   Title  independent with initial HEP    Status  Achieved        PT Long Term Goals - 07/04/19 1148      PT LONG TERM GOAL #1   Title  walk with hand hold assist x 100 feet    Status  Achieved      PT LONG TERM GOAL #2   Title  increased right LE strength to 4-/5    Status  Partially Met      PT LONG TERM GOAL #3   Title   walk 100 feet with SBA    Status  Partially Met      PT LONG TERM GOAL #4   Title  transfer independently with set up    Status  Partially Met            Plan - 07/14/19 1152    Clinical Impression Statement  Patient performed much better with the TUG today taking 16 seconds off her time.  She did have some difficulty with the left hand slipping today causing some incresaed mm activation of the left wrist and shoulder, she c/o tightness and pain in these areas.  She does need to do better with her transfers to be more independnet    PT Next Visit Plan  work on the transfers and try to get her more indepdendent    Consulted and Agree with Plan of Care  Patient       Patient will benefit from skilled therapeutic intervention in order to improve the following deficits and impairments:  Abnormal gait, Decreased coordination, Decreased range of motion, Difficulty walking, Cardiopulmonary status limiting activity, Decreased endurance, Increased muscle spasms, Impaired UE functional use, Decreased activity tolerance, Pain, Decreased balance, Postural dysfunction, Decreased strength, Decreased mobility  Visit Diagnosis: Difficulty in walking, not elsewhere classified  Muscle weakness (generalized)  Late effects of CVA (cerebrovascular accident)  Stiffness of left knee, not elsewhere classified  Pain in left ankle and joints of left foot  Acute pain of left knee  Hemiplegia and hemiparesis following cerebral infarction affecting left non-dominant side [facility])     Problem List Patient Active Problem List   Diagnosis Date Noted  . S/P total knee replacement 01/27/2019  . Recurrent left knee instability 07/05/2018  . Respiratory failure with hypoxia ([place]) 08/30/2017  . Hypoxemia   . Heart failure with preserved ejection fraction ([place]), Grade 3 diastolic dysfunction 94/76/5465  . PAF (paroxysmal atrial fibrillation) ([place])   . Dyspnea 03/19/2017  . Encounter for preventive health  examination 02/17/2016  . Sensorineural hearing loss (SNHL), bilateral 01/26/2016  . Morbid obesity ([place]) 06/17/2015  . Hypomagnesemia  04/24/2014  . Hemiparesis affecting left side as late effect of cerebrovascular accident ([place]) 04/24/2014  . Nontraumatic cerebral hemorrhage ([place]) 04/30/2012  . DM (diabetes mellitus) with complications ([place]) 32/35/5732  . OSTEOPENIA 01/21/2009  . UNSPECIFIED VITAMIN D DEFICIENCY 11/19/2007  . ESSENTIAL HYPERTENSION, BENIGN 11/19/2007  . HYPERCHOLESTEROLEMIA 10/25/2006  . GASTROESOPHAGEAL REFLUX, NO ESOPHAGITIS 10/25/2006  . DIVERTICULOSIS OF COLON 10/25/2006  . Osteoarthritis 10/25/2006  . CERVICAL SPINE DISORDER, NOS 10/25/2006    Sumner Boast., PT 07/14/2019, 11:54 AM  [place] [address] [place], [place], [address] Phone: [phone]   Fax:  [phone]  Name: Karen Jackson MRN: [id number] Date of Birth: [birth year]

## 2019-07-17 ENCOUNTER — Other Ambulatory Visit: Payer: Self-pay

## 2019-07-17 ENCOUNTER — Encounter: Payer: Self-pay | Admitting: Physical Therapy

## 2019-07-17 ENCOUNTER — Ambulatory Visit: Payer: Medicare Other | Admitting: Physical Therapy

## 2019-07-17 DIAGNOSIS — M6281 Muscle weakness (generalized): Secondary | ICD-10-CM

## 2019-07-17 DIAGNOSIS — M25662 Stiffness of left knee, not elsewhere classified: Secondary | ICD-10-CM

## 2019-07-17 DIAGNOSIS — R262 Difficulty in walking, not elsewhere classified: Secondary | ICD-10-CM

## 2019-07-17 DIAGNOSIS — I6990 Unspecified sequelae of unspecified cerebrovascular disease: Secondary | ICD-10-CM

## 2019-07-17 DIAGNOSIS — M25562 Pain in left knee: Secondary | ICD-10-CM | POA: Diagnosis not present

## 2019-07-17 DIAGNOSIS — I69354 Hemiplegia and hemiparesis following cerebral infarction affecting left non-dominant side: Secondary | ICD-10-CM

## 2019-07-17 DIAGNOSIS — M25572 Pain in left ankle and joints of left foot: Secondary | ICD-10-CM

## 2019-07-17 NOTE — Therapy (Signed)
[address] [place], [place], [address] Phone: [phone]   Fax:  [phone]  Physical Therapy Treatment  Patient Details  Name: Karen Jackson MRN: [id number] Date of Birth: 12-18-1940 Referring Provider (PT): Lucey   Encounter Date: 07/17/2019  PT End of Session - 07/17/19 1150    Visit Number  19    Date for PT Re-Evaluation  08/13/19    PT Start Time  1056    PT Stop Time  1140    PT Time Calculation (min)  44 min    Activity Tolerance  Patient limited by pain    Behavior During Therapy  [facility] for tasks assessed/performed       Past Medical History:  Diagnosis Date  . Acute cystitis without hematuria   . Acute diastolic CHF (congestive heart failure) ([place])   . Arthritis   . Dyspnea   . Dysrhythmia   . Fever of unknown origin 03/19/2017  . Hyperlipidemia   . Hypertension    denies at preop  . Multifocal pneumonia   . Neuromuscular disorder ([place])    neuropathy left arm and foot  . Osteopenia   . Paralysis ([place])    partial left side from CVA   . Persistent atrial fibrillation ([place])   . PONV (postoperative nausea and vomiting)   . Pre-diabetes   . Stroke [facility]) 2013   hemmorahgic    Past Surgical History:  Procedure Laterality Date  . ANKLE SURGERY    . APPENDECTOMY    . CHOLECYSTECTOMY    . HERNIA REPAIR     Esophagus  . JOINT REPLACEMENT     total- right partial- left  . MASTECTOMY PARTIAL / LUMPECTOMY  2012   left  . ORIF ANKLE FRACTURE Left 07/20/2018   Procedure: OPEN REDUCTION INTERNAL FIXATION (ORIF) ANKLE FRACTURE;  Surgeon: Wylene Simmer, MD;  Location: [place];  Service: Orthopedics;  Laterality: Left;  . TOTAL KNEE ARTHROPLASTY Left 01/27/2019   Procedure: TOTAL KNEE ARTHROPLASTY;  Surgeon: Vickey Huger, MD;  [facility];  Service: Orthopedics;  Laterality: Left;    There were no vitals filed for this visit.  Subjective Assessment - 07/17/19 1104    Subjective   Patient reports that she is sore and stiff all over, reports the cold weather.    Currently in Pain?  Yes    Pain Score  4     Pain Location  Knee   shoulder and ankle   Aggravating Factors   cold weather                       [facility] Adult PT Treatment/Exercise - 07/17/19 0001      Ambulation/Gait   Gait Comments  Trying to let patient do more and more with less help and less CGA, at times she does need the assist with the sit to stnad transfers, gait today was broken up into 3 x 75' due to left shoulder pain      Knee/Hip Exercises: Aerobic   Nustep  L6 x 78mn       Knee/Hip Exercises: Standing   Heel Raises  Both;2 sets;10 reps    Other Standing Knee Exercises  alternating feet on 2" step emphasizing control and the weight shift to the left.      Knee/Hip Exercises: Seated   Long Arc Quad  Strengthening;Left;10 reps;3 sets    LIllinois Tool WorksWeight  5 lbs.  Other Seated Knee/Hip Exercises  ankle green tband exercises, blue tband press down    Other Seated Knee/Hip Exercises  bosu behind doing partial sit ups, use of a sliding board to have left arm push and pull and then tried some ER/IR in front of her but this was very painful, did some cone pickups and reaching and placing at various angles.    Marching  Strengthening;Left;2 sets;10 reps    Marching Weights  3 lbs.      Manual Therapy   Manual Therapy  Passive ROM    Manual therapy comments  PROM of the left shoulder, approximation of the fingers with motions to stimulate increased function, scar mobilization, ankle ROM and approximation    Passive ROM  motion of the knee and then with pressure through the ankle , this seems to help her pain levels               PT Short Term Goals - 03/20/19 1140      PT SHORT TERM GOAL #1   Title  independent with initial HEP    Status  Achieved        PT Long Term Goals - 07/17/19 1153      PT LONG TERM GOAL #1   Title  walk with hand hold assist x 100  feet    Status  Achieved      PT LONG TERM GOAL #2   Title  increased right LE strength to 4-/5    Status  Partially Met      PT LONG TERM GOAL #3   Title  walk 100 feet with SBA    Status  Partially Met      PT LONG TERM GOAL #4   Title  transfer independently with set up    Status  Partially Met      PT LONG TERM GOAL #5   Title  report no difficulty pulling her pants up when going to the bathroom    Status  Partially Met      PT LONG TERM GOAL #6   Title  decrease TUG time to 45 seconds    Status  Partially Met            Plan - 07/17/19 1151    Clinical Impression Statement  Patient today continued to have increased pain of the left shoulder and had, I tried to have her do some more activity with the left UE but this was difficult for her due to the pain, as she walked she had a lot of popping in the left shoulder and limited her walking distance.    PT Next Visit Plan  work on the transfers and try to get her more indepdendent    Consulted and Agree with Plan of Care  Patient       Patient will benefit from skilled therapeutic intervention in order to improve the following deficits and impairments:  Abnormal gait, Decreased coordination, Decreased range of motion, Difficulty walking, Cardiopulmonary status limiting activity, Decreased endurance, Increased muscle spasms, Impaired UE functional use, Decreased activity tolerance, Pain, Decreased balance, Postural dysfunction, Decreased strength, Decreased mobility  Visit Diagnosis: Difficulty in walking, not elsewhere classified - Plan: PT plan of care cert/re-cert  Muscle weakness (generalized) - Plan: PT plan of care cert/re-cert  Late effects of CVA (cerebrovascular accident) - Plan: PT plan of care cert/re-cert  Stiffness of left knee, not elsewhere classified - Plan: PT plan of care cert/re-cert  Pain in left ankle  and joints of left foot - Plan: PT plan of care cert/re-cert  Acute pain of left knee - Plan: PT  plan of care cert/re-cert  Hemiplegia and hemiparesis following cerebral infarction affecting left non-dominant side ([place]) - Plan: PT plan of care cert/re-cert     Problem List Patient Active Problem List   Diagnosis Date Noted  . S/P total knee replacement 01/27/2019  . Recurrent left knee instability 07/05/2018  . Respiratory failure with hypoxia ([place]) 08/30/2017  . Hypoxemia   . Heart failure with preserved ejection fraction ([place]), Grade 3 diastolic dysfunction 25/24/7998  . PAF (paroxysmal atrial fibrillation) ([place])   . Dyspnea 03/19/2017  . Encounter for preventive health examination 02/17/2016  . Sensorineural hearing loss (SNHL), bilateral 01/26/2016  . Morbid obesity ([place]) 06/17/2015  . Hypomagnesemia 04/24/2014  . Hemiparesis affecting left side as late effect of cerebrovascular accident ([place]) 04/24/2014  . Nontraumatic cerebral hemorrhage ([place]) 04/30/2012  . DM (diabetes mellitus) with complications ([place]) 00/07/3934  . OSTEOPENIA 01/21/2009  . UNSPECIFIED VITAMIN D DEFICIENCY 11/19/2007  . ESSENTIAL HYPERTENSION, BENIGN 11/19/2007  . HYPERCHOLESTEROLEMIA 10/25/2006  . GASTROESOPHAGEAL REFLUX, NO ESOPHAGITIS 10/25/2006  . DIVERTICULOSIS OF COLON 10/25/2006  . Osteoarthritis 10/25/2006  . CERVICAL SPINE DISORDER, NOS 10/25/2006    Sumner Boast., PT 07/17/2019, 11:55 AM  [address] [place], [place], [address] Phone: [phone]   Fax:  [phone]  Name: Karen Jackson MRN: [id number] Date of Birth: [birth year]

## 2019-07-21 ENCOUNTER — Other Ambulatory Visit: Payer: Self-pay

## 2019-07-21 ENCOUNTER — Ambulatory Visit: Payer: Medicare Other | Admitting: Physical Therapy

## 2019-07-21 ENCOUNTER — Encounter: Payer: Self-pay | Admitting: Physical Therapy

## 2019-07-21 DIAGNOSIS — M6281 Muscle weakness (generalized): Secondary | ICD-10-CM | POA: Diagnosis not present

## 2019-07-21 DIAGNOSIS — M25662 Stiffness of left knee, not elsewhere classified: Secondary | ICD-10-CM | POA: Diagnosis not present

## 2019-07-21 DIAGNOSIS — M25572 Pain in left ankle and joints of left foot: Secondary | ICD-10-CM | POA: Diagnosis not present

## 2019-07-21 DIAGNOSIS — R262 Difficulty in walking, not elsewhere classified: Secondary | ICD-10-CM | POA: Diagnosis not present

## 2019-07-21 DIAGNOSIS — I6990 Unspecified sequelae of unspecified cerebrovascular disease: Secondary | ICD-10-CM | POA: Diagnosis not present

## 2019-07-21 DIAGNOSIS — M25562 Pain in left knee: Secondary | ICD-10-CM

## 2019-07-21 DIAGNOSIS — I69354 Hemiplegia and hemiparesis following cerebral infarction affecting left non-dominant side: Secondary | ICD-10-CM

## 2019-07-21 NOTE — Therapy (Signed)
[address] [place], [place], [address] Phone: [phone]   Fax:  [phone]  Physical Therapy Treatment  Patient Details  Name: Karen Jackson MRN: [id number] Date of Birth: [birth year] Referring Provider (PT): Lucey   Encounter Date: 07/21/2019  PT End of Session - 07/21/19 1141    Visit Number  48    Date for PT Re-Evaluation  08/13/19    PT Start Time  1056    PT Stop Time  1138    PT Time Calculation (min)  42 min    Behavior During Therapy  [facility] for tasks assessed/performed       Past Medical History:  Diagnosis Date  . Acute cystitis without hematuria   . Acute diastolic CHF (congestive heart failure) ([place])   . Arthritis   . Dyspnea   . Dysrhythmia   . Fever of unknown origin 03/19/2017  . Hyperlipidemia   . Hypertension    denies at preop  . Multifocal pneumonia   . Neuromuscular disorder ([place])    neuropathy left arm and foot  . Osteopenia   . Paralysis ([place])    partial left side from CVA   . Persistent atrial fibrillation ([place])   . PONV (postoperative nausea and vomiting)   . Pre-diabetes   . Stroke [facility]) 2013   hemmorahgic    Past Surgical History:  Procedure Laterality Date  . ANKLE SURGERY    . APPENDECTOMY    . CHOLECYSTECTOMY    . HERNIA REPAIR     Esophagus  . JOINT REPLACEMENT     total- right partial- left  . MASTECTOMY PARTIAL / LUMPECTOMY  2012   left  . ORIF ANKLE FRACTURE Left 07/20/2018   Procedure: OPEN REDUCTION INTERNAL FIXATION (ORIF) ANKLE FRACTURE;  Surgeon: Wylene Simmer, MD;  Location: [place];  Service: Orthopedics;  Laterality: Left;  . TOTAL KNEE ARTHROPLASTY Left 01/27/2019   Procedure: TOTAL KNEE ARTHROPLASTY;  Surgeon: Vickey Huger, MD;  [facility];  Service: Orthopedics;  Laterality: Left;    There were no vitals filed for this visit.  Subjective Assessment - 07/21/19 1103    Subjective  Patient c/o some shoulder, hand and ankle pain today,  reports that she did not have a caregiver yesterday that could transfer well                       [facility] Adult PT Treatment/Exercise - 07/21/19 0001      Ambulation/Gait   Gait Comments  gait with FWW SBA 110'x2  Very fatigued on the second one, she transfered much better less assist and much safer      Knee/Hip Exercises: Aerobic   Nustep  L6 x 73mn       Knee/Hip Exercises: Standing   Heel Raises  Both;2 sets;10 reps    Other Standing Knee Exercises  alternating feet on 4" step emphasizing control and the weight shift to the left.      Knee/Hip Exercises: Seated   Long Arc Quad  Strengthening;Left;10 reps;3 sets    LIllinois Tool WorksWeight  5 lbs.    Other Seated Knee/Hip Exercises  ankle green tband exercises, blue tband press down    Other Seated Knee/Hip Exercises  red tband left hip adduction as I noticed today on the Nustep she was letting the left hip go out and was having some difficulty controlling the hip motion    Hamstring Curl  3  sets;10 reps    Hamstring Limitations  green tband               PT Short Term Goals - 03/20/19 1140      PT SHORT TERM GOAL #1   Title  independent with initial HEP    Status  Achieved        PT Long Term Goals - 07/21/19 1143      PT LONG TERM GOAL #4   Title  transfer independently with set up    Status  Partially Met      PT LONG TERM GOAL #5   Title  report no difficulty pulling her pants up when going to the bathroom    Status  Partially Met      PT LONG TERM GOAL #6   Title  decrease TUG time to 45 seconds    Status  Partially Met            Plan - 07/21/19 1141    Clinical Impression Statement  Patient doing better with the transfers and her gait is it less labored and requiring less asssitance.  She is still really elevatign the left shoulder and there is a lot of crepitus with any walking, I plan on trying to have her put less weight on the left arm when walking to see if we can decreased the  spasms and the pain that this is causing, there is definitely some spasticity in the left arm with activity    PT Next Visit Plan  she will be away for the next week due to Thanksgiving, tried to encourage her to walk with help at home and do some exercises    Consulted and Agree with Plan of Care  Patient       Patient will benefit from skilled therapeutic intervention in order to improve the following deficits and impairments:  Abnormal gait, Decreased coordination, Decreased range of motion, Difficulty walking, Cardiopulmonary status limiting activity, Decreased endurance, Increased muscle spasms, Impaired UE functional use, Decreased activity tolerance, Pain, Decreased balance, Postural dysfunction, Decreased strength, Decreased mobility  Visit Diagnosis: Difficulty in walking, not elsewhere classified  Muscle weakness (generalized)  Late effects of CVA (cerebrovascular accident)  Stiffness of left knee, not elsewhere classified  Pain in left ankle and joints of left foot  Acute pain of left knee  Hemiplegia and hemiparesis following cerebral infarction affecting left non-dominant side [facility])     Problem List Patient Active Problem List   Diagnosis Date Noted  . S/P total knee replacement 01/27/2019  . Recurrent left knee instability 07/05/2018  . Respiratory failure with hypoxia ([place]) 08/30/2017  . Hypoxemia   . Heart failure with preserved ejection fraction ([place]), Grade 3 diastolic dysfunction 67/54/4920  . PAF (paroxysmal atrial fibrillation) ([place])   . Dyspnea 03/19/2017  . Encounter for preventive health examination 02/17/2016  . Sensorineural hearing loss (SNHL), bilateral 01/26/2016  . Morbid obesity ([place]) 06/17/2015  . Hypomagnesemia 04/24/2014  . Hemiparesis affecting left side as late effect of cerebrovascular accident ([place]) 04/24/2014  . Nontraumatic cerebral hemorrhage ([place]) 04/30/2012  . DM (diabetes mellitus) with complications ([place]) 06/03/1218  . OSTEOPENIA  01/21/2009  . UNSPECIFIED VITAMIN D DEFICIENCY 11/19/2007  . ESSENTIAL HYPERTENSION, BENIGN 11/19/2007  . HYPERCHOLESTEROLEMIA 10/25/2006  . GASTROESOPHAGEAL REFLUX, NO ESOPHAGITIS 10/25/2006  . DIVERTICULOSIS OF COLON 10/25/2006  . Osteoarthritis 10/25/2006  . CERVICAL SPINE DISORDER, NOS 10/25/2006    Sumner Boast., PT 07/21/2019, 11:44 AM  [place]  [address] [place] [place], [place], [address] Phone: [phone]   Fax:  [phone]  Name: ZULEYKA KLOC MRN: [id number] Date of Birth: [birth year]

## 2019-07-28 ENCOUNTER — Encounter: Payer: Self-pay | Admitting: Physical Therapy

## 2019-07-28 ENCOUNTER — Other Ambulatory Visit: Payer: Self-pay

## 2019-07-28 ENCOUNTER — Ambulatory Visit: Payer: Medicare Other | Admitting: Physical Therapy

## 2019-07-28 DIAGNOSIS — M25562 Pain in left knee: Secondary | ICD-10-CM

## 2019-07-28 DIAGNOSIS — I6990 Unspecified sequelae of unspecified cerebrovascular disease: Secondary | ICD-10-CM | POA: Diagnosis not present

## 2019-07-28 DIAGNOSIS — R262 Difficulty in walking, not elsewhere classified: Secondary | ICD-10-CM

## 2019-07-28 DIAGNOSIS — M25662 Stiffness of left knee, not elsewhere classified: Secondary | ICD-10-CM | POA: Diagnosis not present

## 2019-07-28 DIAGNOSIS — M6281 Muscle weakness (generalized): Secondary | ICD-10-CM

## 2019-07-28 DIAGNOSIS — M25572 Pain in left ankle and joints of left foot: Secondary | ICD-10-CM | POA: Diagnosis not present

## 2019-07-28 DIAGNOSIS — I69354 Hemiplegia and hemiparesis following cerebral infarction affecting left non-dominant side: Secondary | ICD-10-CM

## 2019-07-28 NOTE — Therapy (Signed)
[place] [address] [place], [place], [address] Phone: [phone]   Fax:  [phone]  Physical Therapy Treatment  Patient Details  Name: Karen Jackson MRN: [id number] Date of Birth: 04-08-1941 Referring Provider (PT): Lucey   Encounter Date: 07/28/2019  PT End of Session - 07/28/19 1145    Visit Number  57    Date for PT Re-Evaluation  08/13/19    PT Start Time  1058    PT Stop Time  1138    PT Time Calculation (min)  40 min    Activity Tolerance  Patient tolerated treatment well    Behavior During Therapy  [facility] for tasks assessed/performed       Past Medical History:  Diagnosis Date  . Acute cystitis without hematuria   . Acute diastolic CHF (congestive heart failure) ([place])   . Arthritis   . Dyspnea   . Dysrhythmia   . Fever of unknown origin 03/19/2017  . Hyperlipidemia   . Hypertension    denies at preop  . Multifocal pneumonia   . Neuromuscular disorder ([place])    neuropathy left arm and foot  . Osteopenia   . Paralysis ([place])    partial left side from CVA   . Persistent atrial fibrillation ([place])   . PONV (postoperative nausea and vomiting)   . Pre-diabetes   . Stroke [facility] 2013   hemmorahgic    Past Surgical History:  Procedure Laterality Date  . ANKLE SURGERY    . APPENDECTOMY    . CHOLECYSTECTOMY    . HERNIA REPAIR     Esophagus  . JOINT REPLACEMENT     total- right partial- left  . MASTECTOMY PARTIAL / LUMPECTOMY  2012   left  . ORIF ANKLE FRACTURE Left 07/20/2018   Procedure: OPEN REDUCTION INTERNAL FIXATION (ORIF) ANKLE FRACTURE;  Surgeon: Wylene Simmer, MD;  Location: [place];  Service: Orthopedics;  Laterality: Left;  . TOTAL KNEE ARTHROPLASTY Left 01/27/2019   Procedure: TOTAL KNEE ARTHROPLASTY;  Surgeon: Vickey Huger, MD;  [facility];  Service: Orthopedics;  Laterality: Left;    There were no vitals filed for this visit.  Subjective Assessment - 07/28/19 1102    Subjective  Doing okay, felt pretty good recently    Currently in Pain?  No/denies                       [facility] Adult PT Treatment/Exercise - 07/28/19 0001      Ambulation/Gait   Gait Comments  due to her not walking over the past 5 days we did shorter but more walking, 60' x 4, she had a lot of difficulty initiating walking today      Knee/Hip Exercises: Aerobic   Nustep  L6 x 19mn       Knee/Hip Exercises: Seated   Long Arc Quad  Strengthening;Left;10 reps;3 sets    LIllinois Tool WorksWeight  5 lbs.    Long ACSX CorporationLimitations  cues for TMonsanto Company   Other Seated Knee/Hip Exercises  ankle green tband exercises, blue tband press down    Other Seated Knee/Hip Exercises  red tband left hip adduction as I noticed today on the Nustep she was letting the left hip go out and was having some difficulty controlling the hip motion    Hamstring Curl  3 sets;10 reps    Hamstring Limitations  green tband      Manual Therapy  Manual Therapy  Passive ROM    Manual therapy comments  PROM of the left shoulder, approximation of the fingers with motions to stimulate increased function, scar mobilization, ankle ROM and approximation    Passive ROM  motion of the knee and then with pressure through the ankle , this seems to help her pain levels               PT Short Term Goals - 03/20/19 1140      PT SHORT TERM GOAL #1   Title  independent with initial HEP    Status  Achieved        PT Long Term Goals - 07/21/19 1143      PT LONG TERM GOAL #4   Title  transfer independently with set up    Status  Partially Met      PT LONG TERM GOAL #5   Title  report no difficulty pulling her pants up when going to the bathroom    Status  Partially Met      PT LONG TERM GOAL #6   Title  decrease TUG time to 45 seconds    Status  Partially Met            Plan - 07/28/19 1146    Clinical Impression Statement  Patient did not walk over the past 5-6 days due to not having a  caregiver that would walk with her.  She really struggled with the initiation of gait, seemed to be leaning backward and woud actually talk small backward steps and not able to move forward today without assist from PT to move walker forward.  I have encouraged the need to walk at home especially over the weekend as she seems to regress without walking.    PT Next Visit Plan  Gait and indpendence    Consulted and Agree with Plan of Care  Patient       Patient will benefit from skilled therapeutic intervention in order to improve the following deficits and impairments:  Abnormal gait, Decreased coordination, Decreased range of motion, Difficulty walking, Cardiopulmonary status limiting activity, Decreased endurance, Increased muscle spasms, Impaired UE functional use, Decreased activity tolerance, Pain, Decreased balance, Postural dysfunction, Decreased strength, Decreased mobility  Visit Diagnosis: Difficulty in walking, not elsewhere classified  Late effects of CVA (cerebrovascular accident)  Muscle weakness (generalized)  Stiffness of left knee, not elsewhere classified  Pain in left ankle and joints of left foot  Acute pain of left knee  Hemiplegia and hemiparesis following cerebral infarction affecting left non-dominant side [facility])     Problem List Patient Active Problem List   Diagnosis Date Noted  . S/P total knee replacement 01/27/2019  . Recurrent left knee instability 07/05/2018  . Respiratory failure with hypoxia ([place]) 08/30/2017  . Hypoxemia   . Heart failure with preserved ejection fraction ([place]), Grade 3 diastolic dysfunction 29/19/1660  . PAF (paroxysmal atrial fibrillation) ([place])   . Dyspnea 03/19/2017  . Encounter for preventive health examination 02/17/2016  . Sensorineural hearing loss (SNHL), bilateral 01/26/2016  . Morbid obesity ([place]) 06/17/2015  . Hypomagnesemia 04/24/2014  . Hemiparesis affecting left side as late effect of cerebrovascular accident ([place])  04/24/2014  . Nontraumatic cerebral hemorrhage ([place]) 04/30/2012  . DM (diabetes mellitus) with complications ([place]) 60/11/5995  . OSTEOPENIA 01/21/2009  . UNSPECIFIED VITAMIN D DEFICIENCY 11/19/2007  . ESSENTIAL HYPERTENSION, BENIGN 11/19/2007  . HYPERCHOLESTEROLEMIA 10/25/2006  . GASTROESOPHAGEAL REFLUX, NO ESOPHAGITIS 10/25/2006  . DIVERTICULOSIS OF COLON 10/25/2006  .  Osteoarthritis 10/25/2006  . CERVICAL SPINE DISORDER, NOS 10/25/2006    Sumner Boast., PT 07/28/2019, 11:49 AM  [address] [address] [place], [place], [address] Phone: [phone]   Fax:  [phone]  Name: JAZMINN POMALES MRN: [id number] Date of Birth: [birth year]

## 2019-07-31 ENCOUNTER — Other Ambulatory Visit: Payer: Self-pay

## 2019-07-31 ENCOUNTER — Ambulatory Visit: Payer: Medicare Other | Attending: Orthopedic Surgery | Admitting: Physical Therapy

## 2019-07-31 DIAGNOSIS — M25662 Stiffness of left knee, not elsewhere classified: Secondary | ICD-10-CM | POA: Insufficient documentation

## 2019-07-31 DIAGNOSIS — I6990 Unspecified sequelae of unspecified cerebrovascular disease: Secondary | ICD-10-CM | POA: Insufficient documentation

## 2019-07-31 DIAGNOSIS — R262 Difficulty in walking, not elsewhere classified: Secondary | ICD-10-CM | POA: Diagnosis not present

## 2019-07-31 DIAGNOSIS — I69354 Hemiplegia and hemiparesis following cerebral infarction affecting left non-dominant side: Secondary | ICD-10-CM | POA: Insufficient documentation

## 2019-07-31 DIAGNOSIS — M25572 Pain in left ankle and joints of left foot: Secondary | ICD-10-CM | POA: Insufficient documentation

## 2019-07-31 DIAGNOSIS — M25562 Pain in left knee: Secondary | ICD-10-CM | POA: Diagnosis present

## 2019-07-31 DIAGNOSIS — M6281 Muscle weakness (generalized): Secondary | ICD-10-CM | POA: Diagnosis present

## 2019-07-31 NOTE — Therapy (Signed)
[address] [place], [place], [address] Phone: [phone]   Fax:  [phone]  Physical Therapy Treatment  Patient Details  Name: Karen Jackson MRN: [id number] Date of Birth: 03-Mar-1941 Referring Provider (PT): Lucey   Encounter Date: 07/31/2019  PT End of Session - 07/31/19 1150    Visit Number  50    Date for PT Re-Evaluation  08/13/19    PT Start Time  1057    PT Stop Time  1145    PT Time Calculation (min)  48 min       Past Medical History:  Diagnosis Date  . Acute cystitis without hematuria   . Acute diastolic CHF (congestive heart failure) ([place])   . Arthritis   . Dyspnea   . Dysrhythmia   . Fever of unknown origin 03/19/2017  . Hyperlipidemia   . Hypertension    denies at preop  . Multifocal pneumonia   . Neuromuscular disorder ([place])    neuropathy left arm and foot  . Osteopenia   . Paralysis ([place])    partial left side from CVA   . Persistent atrial fibrillation ([place])   . PONV (postoperative nausea and vomiting)   . Pre-diabetes   . Stroke [facility]) 2013   hemmorahgic    Past Surgical History:  Procedure Laterality Date  . ANKLE SURGERY    . APPENDECTOMY    . CHOLECYSTECTOMY    . HERNIA REPAIR     Esophagus  . JOINT REPLACEMENT     total- right partial- left  . MASTECTOMY PARTIAL / LUMPECTOMY  2012   left  . ORIF ANKLE FRACTURE Left 07/20/2018   Procedure: OPEN REDUCTION INTERNAL FIXATION (ORIF) ANKLE FRACTURE;  Surgeon: Wylene Simmer, MD;  Location: [place];  Service: Orthopedics;  Laterality: Left;  . TOTAL KNEE ARTHROPLASTY Left 01/27/2019   Procedure: TOTAL KNEE ARTHROPLASTY;  Surgeon: Vickey Huger, MD;  [facility];  Service: Orthopedics;  Laterality: Left;    There were no vitals filed for this visit.  Subjective Assessment - 07/31/19 1144    Subjective  shld is really bothering . walking better than last session    Currently in Pain?  Yes    Pain Score  4     Pain  Location  Shoulder    Pain Orientation  Left                       [facility] Adult PT Treatment/Exercise - 07/31/19 0001      Ambulation/Gait   Gait Comments  amb HHA 45 feet min A, amb with RW 60 feet 2 times CG with cuing, very SOB 2nd walk. increased ease of STS and no LOB or stepping backward      Knee/Hip Exercises: Aerobic   Nustep  L6 x 43mn       Knee/Hip Exercises: Seated   Long Arc Quad  Strengthening;Left;10 reps;2 sets    LIllinois Tool WorksWeight  5 lbs.    Long ACSX CorporationLimitations  cues for TMonsanto Company  hold 3 sec   Clamshell with TheraBand  Green    Marching  Strengthening;Left;2 sets;10 reps   green tband   Marching Limitations  march into abd on 6 inch box 5x on Left -very difficult    Hamstring Curl  3 sets;10 reps    Hamstring Limitations  green tband      Manual Therapy   Manual Therapy  Passive  ROM    Manual therapy comments  PROM of the left shoulder, approximation of the fingers with motions to stimulate increased function, scar mobilization, ankle ROM and approximation               PT Short Term Goals - 03/20/19 1140      PT SHORT TERM GOAL #1   Title  independent with initial HEP    Status  Achieved        PT Long Term Goals - 07/21/19 1143      PT LONG TERM GOAL #4   Title  transfer independently with set up    Status  Partially Met      PT LONG TERM GOAL #5   Title  report no difficulty pulling her pants up when going to the bathroom    Status  Partially Met      PT LONG TERM GOAL #6   Title  decrease TUG time to 45 seconds    Status  Partially Met            Plan - 07/31/19 1150    Clinical Impression Statement  improved gait today, no LOB or backward stepping. pt did self cue and 2nd walk with RW became SOB. difficulty with hip flex/abd onto 6 inch box-weakness and compendation. overall better gait and transfers after  having PT earlier this week.    PT Treatment/Interventions  ADLs/Self Care Home  Management;Cryotherapy;Electrical Stimulation;Iontophoresis 50m/ml Dexamethasone;Moist Heat;Ultrasound;Gait training;Balance training;Therapeutic exercise;Therapeutic activities;Functional mobility training;Patient/family education;Manual techniques;Dry needling    PT Next Visit Plan  Gait and indpendence       Patient will benefit from skilled therapeutic intervention in order to improve the following deficits and impairments:  Abnormal gait, Decreased coordination, Decreased range of motion, Difficulty walking, Cardiopulmonary status limiting activity, Decreased endurance, Increased muscle spasms, Impaired UE functional use, Decreased activity tolerance, Pain, Decreased balance, Postural dysfunction, Decreased strength, Decreased mobility  Visit Diagnosis: Difficulty in walking, not elsewhere classified  Late effects of CVA (cerebrovascular accident)  Muscle weakness (generalized)  Stiffness of left knee, not elsewhere classified     Problem List Patient Active Problem List   Diagnosis Date Noted  . S/P total knee replacement 01/27/2019  . Recurrent left knee instability 07/05/2018  . Res[facility] with hypoxia ([facility] 08/30/2017  . Hypoxemia   . Heart failure with preserved ejection fraction ([place], Grade 3 diastolic dysfunction 054/27/0623 . PAF (paroxysmal atrial fibrillation) ([place]   . Dyspnea 03/19/2017  . Encounter for preventive health examination 02/17/2016  . Sensorineural hearing loss (SNHL), bilateral 01/26/2016  . Morbid obesity ([place] 06/17/2015  . Hypomagnesemia 04/24/2014  . Hemiparesis affecting left side as late effect of cerebrovascular accident ([place] 04/24/2014  . Nontraumatic cerebral hemorrhage ([facility] 04/30/2012  . DM (diabetes mellitus) with complications ([facility] 076/28/3151 . OSTEOPENIA 01/21/2009  . UNSPECIFIED VITAMIN D DEFICIENCY 11/19/2007  . ESSENTIAL HYPERTENSION, BENIGN 11/19/2007  . HYPERCHOLESTEROLEMIA 10/25/2006  . GASTROESOPHAGEAL REFLUX, NO  ESOPHAGITIS 10/25/2006  . DIVERTICULOSIS OF COLON 10/25/2006  . Osteoarthritis 10/25/2006  . CERVICAL SPINE DISORDER, NOS 10/25/2006    ,ANGIE PTA 07/31/2019, 11:56 AM  [address] [address]Phone: [phone]  Fax:  [phone] Name: Karen FULMOREMRN: [id number] of Birth: 101/23/1942

## 2019-08-04 ENCOUNTER — Ambulatory Visit: Payer: Medicare Other | Admitting: Physical Therapy

## 2019-08-04 ENCOUNTER — Encounter: Payer: Self-pay | Admitting: Physical Therapy

## 2019-08-04 ENCOUNTER — Other Ambulatory Visit: Payer: Self-pay

## 2019-08-04 DIAGNOSIS — M25562 Pain in left knee: Secondary | ICD-10-CM

## 2019-08-04 DIAGNOSIS — I69354 Hemiplegia and hemiparesis following cerebral infarction affecting left non-dominant side: Secondary | ICD-10-CM

## 2019-08-04 DIAGNOSIS — M6281 Muscle weakness (generalized): Secondary | ICD-10-CM

## 2019-08-04 DIAGNOSIS — R262 Difficulty in walking, not elsewhere classified: Secondary | ICD-10-CM

## 2019-08-04 DIAGNOSIS — I6990 Unspecified sequelae of unspecified cerebrovascular disease: Secondary | ICD-10-CM

## 2019-08-04 DIAGNOSIS — M25662 Stiffness of left knee, not elsewhere classified: Secondary | ICD-10-CM

## 2019-08-04 DIAGNOSIS — M25572 Pain in left ankle and joints of left foot: Secondary | ICD-10-CM

## 2019-08-04 NOTE — Therapy (Signed)
[address] [place], [place], [address] Phone: [phone]   Fax:  [phone]  Physical Therapy Treatment  Patient Details  Name: Karen Jackson MRN: [id number] Date of Birth: Dec 07, 1940 Referring Provider (PT): Lucey   Encounter Date: 08/04/2019  PT End of Session - 08/04/19 1143    Visit Number  48    Date for PT Re-Evaluation  08/13/19    PT Start Time  1057    PT Stop Time  1143    PT Time Calculation (min)  46 min    Activity Tolerance  Patient limited by pain    Behavior During Therapy  [facility] for tasks assessed/performed       Past Medical History:  Diagnosis Date  . Acute cystitis without hematuria   . Acute diastolic CHF (congestive heart failure) ([place])   . Arthritis   . Dyspnea   . Dysrhythmia   . Fever of unknown origin 03/19/2017  . Hyperlipidemia   . Hypertension    denies at preop  . Multifocal pneumonia   . Neuromuscular disorder ([place])    neuropathy left arm and foot  . Osteopenia   . Paralysis ([place])    partial left side from CVA   . Persistent atrial fibrillation ([place])   . PONV (postoperative nausea and vomiting)   . Pre-diabetes   . Stroke [facility]) 2013   hemmorahgic    Past Surgical History:  Procedure Laterality Date  . ANKLE SURGERY    . APPENDECTOMY    . CHOLECYSTECTOMY    . HERNIA REPAIR     Esophagus  . JOINT REPLACEMENT     total- right partial- left  . MASTECTOMY PARTIAL / LUMPECTOMY  2012   left  . ORIF ANKLE FRACTURE Left 07/20/2018   Procedure: OPEN REDUCTION INTERNAL FIXATION (ORIF) ANKLE FRACTURE;  Surgeon: Wylene Simmer, MD;  Location: [facility];  Service: Orthopedics;  Laterality: Left;  . TOTAL KNEE ARTHROPLASTY Left 01/27/2019   Procedure: TOTAL KNEE ARTHROPLASTY;  Surgeon: Vickey Huger, MD;  [facility];  Service: Orthopedics;  Laterality: Left;    There were no vitals filed for this visit.  Subjective Assessment - 08/04/19 1140    Subjective   Patient reports that she is really hurting today and haaving some difficulty with transfers, this seems to happen over the weekend as she usually does not have a caregiver that will do much with her    Currently in Pain?  Yes    Pain Score  6     Pain Location  Shoulder   ankle   Pain Orientation  Left    Pain Descriptors / Indicators  Aching;Sore    Aggravating Factors   weather and after a weekend                       [facility] Adult PT Treatment/Exercise - 08/04/19 0001      Ambulation/Gait   Gait Comments  with FWW and close w/c follow 3 x 75 feet      Knee/Hip Exercises: Aerobic   Nustep  L6 x 22mn       Knee/Hip Exercises: Machines for Strengthening   Cybex Knee Flexion  20#3x10      Knee/Hip Exercises: Seated   Long Arc Quad  Strengthening;Left;10 reps;2 sets    Long Arc Quad Weight  5 lbs.    Other Seated Knee/Hip Exercises  ankle green tband exercises, blue tband press down  Other Seated Knee/Hip Exercises  red tband left hip adduction as I noticed today on the Nustep she was letting the left hip go out and was having some difficulty controlling the hip motion    Hamstring Curl  3 sets;10 reps    Hamstring Limitations  green tband      Manual Therapy   Manual Therapy  Passive ROM    Manual therapy comments  PROM of the left shoulder, approximation of the fingers with motions to stimulate increased function, scar mobilization, ankle ROM and approximation               PT Short Term Goals - 03/20/19 1140      PT SHORT TERM GOAL #1   Title  independent with initial HEP    Status  Achieved        PT Long Term Goals - 07/21/19 1143      PT LONG TERM GOAL #4   Title  transfer independently with set up    Status  Partially Met      PT LONG TERM GOAL #5   Title  report no difficulty pulling her pants up when going to the bathroom    Status  Partially Met      PT LONG TERM GOAL #6   Title  decrease TUG time to 45 seconds    Status   Partially Met            Plan - 08/04/19 1144    Clinical Impression Statement  Patient had a little more difficulty with transfers as well as some left ankle rolling.  There was a lot more crepitus in the shoulders today with walking.  Her first few steps she tended to lean backwards and was going backwards, it took a few steps before she could go forward, needed cues to lean forward    PT Next Visit Plan  I again really encouraged her to do more at home she reports difficulty getting caregivers that will do that    Consulted and Agree with Plan of Care  Patient       Patient will benefit from skilled therapeutic intervention in order to improve the following deficits and impairments:  Abnormal gait, Decreased coordination, Decreased range of motion, Difficulty walking, Cardiopulmonary status limiting activity, Decreased endurance, Increased muscle spasms, Impaired UE functional use, Decreased activity tolerance, Pain, Decreased balance, Postural dysfunction, Decreased strength, Decreased mobility  Visit Diagnosis: Difficulty in walking, not elsewhere classified  Late effects of CVA (cerebrovascular accident)  Muscle weakness (generalized)  Stiffness of left knee, not elsewhere classified  Pain in left ankle and joints of left foot  Acute pain of left knee  Hemiplegia and hemiparesis following cerebral infarction affecting left non-dominant side [facility])     Problem List Patient Active Problem List   Diagnosis Date Noted  . S/P total knee replacement 01/27/2019  . Recurrent left knee instability 07/05/2018  . Respiratory failure with hypoxia ([place]) 08/30/2017  . Hypoxemia   . Heart failure with preserved ejection fraction ([place]), Grade 3 diastolic dysfunction 28/41/3244  . PAF (paroxysmal atrial fibrillation) ([place])   . Dyspnea 03/19/2017  . Encounter for preventive health examination 02/17/2016  . Sensorineural hearing loss (SNHL), bilateral 01/26/2016  . Morbid obesity  ([place]) 06/17/2015  . Hypomagnesemia 04/24/2014  . Hemiparesis affecting left side as late effect of cerebrovascular accident ([place]) 04/24/2014  . Nontraumatic cerebral hemorrhage ([place]) 04/30/2012  . DM (diabetes mellitus) with complications ([place]) 08/30/7251  . OSTEOPENIA 01/21/2009  .  UNSPECIFIED VITAMIN D DEFICIENCY 11/19/2007  . ESSENTIAL HYPERTENSION, BENIGN 11/19/2007  . HYPERCHOLESTEROLEMIA 10/25/2006  . GASTROESOPHAGEAL REFLUX, NO ESOPHAGITIS 10/25/2006  . DIVERTICULOSIS OF COLON 10/25/2006  . Osteoarthritis 10/25/2006  . CERVICAL SPINE DISORDER, NOS 10/25/2006    Sumner Boast., PT 08/04/2019, 11:50 AM  [address] [place], [place], [address] Phone: [phone]   Fax:  [phone]  Name: Karen Jackson MRN: [id number] Date of Birth: [birth year]

## 2019-08-07 ENCOUNTER — Encounter: Payer: Self-pay | Admitting: Physical Therapy

## 2019-08-07 ENCOUNTER — Other Ambulatory Visit: Payer: Self-pay

## 2019-08-07 ENCOUNTER — Ambulatory Visit: Payer: Medicare Other | Admitting: Physical Therapy

## 2019-08-07 DIAGNOSIS — M6281 Muscle weakness (generalized): Secondary | ICD-10-CM

## 2019-08-07 DIAGNOSIS — M25572 Pain in left ankle and joints of left foot: Secondary | ICD-10-CM

## 2019-08-07 DIAGNOSIS — R262 Difficulty in walking, not elsewhere classified: Secondary | ICD-10-CM | POA: Diagnosis not present

## 2019-08-07 DIAGNOSIS — I69354 Hemiplegia and hemiparesis following cerebral infarction affecting left non-dominant side: Secondary | ICD-10-CM

## 2019-08-07 DIAGNOSIS — M25662 Stiffness of left knee, not elsewhere classified: Secondary | ICD-10-CM

## 2019-08-07 DIAGNOSIS — M25562 Pain in left knee: Secondary | ICD-10-CM

## 2019-08-07 DIAGNOSIS — I6990 Unspecified sequelae of unspecified cerebrovascular disease: Secondary | ICD-10-CM

## 2019-08-07 NOTE — Therapy (Signed)
[address] [place], [place], [address] Phone: [phone]   Fax:  [phone]  Physical Therapy Treatment  Patient Details  Name: Karen Jackson MRN: [id number] Date of Birth: 14-Sep-1940 Referring Provider (PT): Lucey   Encounter Date: 08/07/2019  PT End of Session - 08/07/19 1152    Visit Number  80    Date for PT Re-Evaluation  08/13/19    PT Start Time  1057    PT Stop Time  1140    PT Time Calculation (min)  43 min    Activity Tolerance  Patient tolerated treatment well       Past Medical History:  Diagnosis Date  . Acute cystitis without hematuria   . Acute diastolic CHF (congestive heart failure) ([place])   . Arthritis   . Dyspnea   . Dysrhythmia   . Fever of unknown origin 03/19/2017  . Hyperlipidemia   . Hypertension    denies at preop  . Multifocal pneumonia   . Neuromuscular disorder ([place])    neuropathy left arm and foot  . Osteopenia   . Paralysis ([place])    partial left side from CVA   . Persistent atrial fibrillation ([place])   . PONV (postoperative nausea and vomiting)   . Pre-diabetes   . Stroke [facility]) 2013   hemmorahgic    Past Surgical History:  Procedure Laterality Date  . ANKLE SURGERY    . APPENDECTOMY    . CHOLECYSTECTOMY    . HERNIA REPAIR     Esophagus  . JOINT REPLACEMENT     total- right partial- left  . MASTECTOMY PARTIAL / LUMPECTOMY  2012   left  . ORIF ANKLE FRACTURE Left 07/20/2018   Procedure: OPEN REDUCTION INTERNAL FIXATION (ORIF) ANKLE FRACTURE;  Surgeon: Wylene Simmer, MD;  Location: [facility];  Service: Orthopedics;  Laterality: Left;  . TOTAL KNEE ARTHROPLASTY Left 01/27/2019   Procedure: TOTAL KNEE ARTHROPLASTY;  Surgeon: Vickey Huger, MD;  [facility];  Service: Orthopedics;  Laterality: Left;    There were no vitals filed for this visit.  Subjective Assessment - 08/07/19 1119    Subjective  PAtient reports that she has not been able to get a good  caregiver to walk with her on the weekends and therefore she seems to regress over the weekend    Currently in Pain?  Yes    Pain Score  3     Pain Location  Shoulder    Pain Orientation  Left    Pain Relieving Factors  the treatment helps                       [facility] Adult PT Treatment/Exercise - 08/07/19 0001      Ambulation/Gait   Gait Comments  FWW 65' x 1, 100' x 2 cues at first to lean forward she is continuing to lean back and the first few steps are without any fwd motions      Knee/Hip Exercises: Stretches   Sports administrator  Left;3 reps;10 seconds    Gastroc Stretch  3 reps;20 seconds;Left      Knee/Hip Exercises: Standing   Other Standing Knee Exercises  toe touches onto 4" step lateral and in front 10x each      Knee/Hip Exercises: Seated   Long Arc Quad  Strengthening;Left;10 reps;2 sets    Long Arc Quad Weight  5 lbs.    Other Seated Knee/Hip Exercises  ankle  green tband exercises, blue tband press down, bosu behind working on abdominals 3x10 crunches    Other Seated Knee/Hip Exercises  left ankle red tband PF/DF, left hip adduction red tband, ankle on sit fit PF/DF    Hamstring Curl  3 sets;10 reps    Hamstring Limitations  green tband               PT Short Term Goals - 03/20/19 1140      PT SHORT TERM GOAL #1   Title  independent with initial HEP    Status  Achieved        PT Long Term Goals - 08/07/19 1156      PT LONG TERM GOAL #2   Title  increased right LE strength to 4-/5    Status  Achieved      PT LONG TERM GOAL #3   Title  walk 100 feet with SBA    Status  Partially Met      PT LONG TERM GOAL #4   Title  transfer independently with set up    Status  Partially Met            Plan - 08/07/19 1153    Clinical Impression Statement  Patient tried the new ankle brace that I recommended, this worked well no rolling of ankle noted today.  She did better with her walking but the first few steps are without any fwd  motions, she with some cues and me moving the walker will then get going.  She is struggling at home to have a care giver walk with her on the weekends, this does not help with carryover as usually the first visit of the week she is regressed    PT Next Visit Plan  she is going to try to find a caregiver who will walk with her which will help with carryover    Consulted and Agree with Plan of Care  Patient       Patient will benefit from skilled therapeutic intervention in order to improve the following deficits and impairments:  Abnormal gait, Decreased coordination, Decreased range of motion, Difficulty walking, Cardiopulmonary status limiting activity, Decreased endurance, Increased muscle spasms, Impaired UE functional use, Decreased activity tolerance, Pain, Decreased balance, Postural dysfunction, Decreased strength, Decreased mobility  Visit Diagnosis: Difficulty in walking, not elsewhere classified  Late effects of CVA (cerebrovascular accident)  Muscle weakness (generalized)  Stiffness of left knee, not elsewhere classified  Pain in left ankle and joints of left foot  Acute pain of left knee  Hemiplegia and hemiparesis following cerebral infarction affecting left non-dominant side [facility]     Problem List Patient Active Problem List   Diagnosis Date Noted  . S/P total knee replacement 01/27/2019  . Recurrent left knee instability 07/05/2018  . Respiratory failure with hypoxia ([place]) 08/30/2017  . Hypoxemia   . Heart failure with preserved ejection fraction ([place]), Grade 3 diastolic dysfunction 20/94/7096  . PAF (paroxysmal atrial fibrillation) ([place])   . Dyspnea 03/19/2017  . Encounter for preventive health examination 02/17/2016  . Sensorineural hearing loss (SNHL), bilateral 01/26/2016  . Morbid obesity ([place]) 06/17/2015  . Hypomagnesemia 04/24/2014  . Hemiparesis affecting left side as late effect of cerebrovascular accident ([place]) 04/24/2014  . Nontraumatic cerebral  hemorrhage ([place]) 04/30/2012  . DM (diabetes mellitus) with complications ([place]) 28/36/6294  . OSTEOPENIA 01/21/2009  . UNSPECIFIED VITAMIN D DEFICIENCY 11/19/2007  . ESSENTIAL HYPERTENSION, BENIGN 11/19/2007  . HYPERCHOLESTEROLEMIA 10/25/2006  . GASTROESOPHAGEAL REFLUX, NO ESOPHAGITIS  10/25/2006  . DIVERTICULOSIS OF COLON 10/25/2006  . Osteoarthritis 10/25/2006  . CERVICAL SPINE DISORDER, NOS 10/25/2006    Sumner Boast., PT 08/07/2019, 11:57 AM  [address] [place], [place], [address] Phone: [phone]   Fax:  [phone]  Name: Karen Jackson MRN: [id number] Date of Birth: [birth year]

## 2019-08-11 ENCOUNTER — Other Ambulatory Visit: Payer: Self-pay

## 2019-08-11 ENCOUNTER — Encounter: Payer: Self-pay | Admitting: Physical Therapy

## 2019-08-11 ENCOUNTER — Ambulatory Visit: Payer: Medicare Other | Admitting: Physical Therapy

## 2019-08-11 DIAGNOSIS — I69354 Hemiplegia and hemiparesis following cerebral infarction affecting left non-dominant side: Secondary | ICD-10-CM

## 2019-08-11 DIAGNOSIS — R262 Difficulty in walking, not elsewhere classified: Secondary | ICD-10-CM | POA: Diagnosis not present

## 2019-08-11 DIAGNOSIS — M25662 Stiffness of left knee, not elsewhere classified: Secondary | ICD-10-CM

## 2019-08-11 DIAGNOSIS — M6281 Muscle weakness (generalized): Secondary | ICD-10-CM

## 2019-08-11 DIAGNOSIS — M25562 Pain in left knee: Secondary | ICD-10-CM

## 2019-08-11 DIAGNOSIS — I6990 Unspecified sequelae of unspecified cerebrovascular disease: Secondary | ICD-10-CM

## 2019-08-11 DIAGNOSIS — M25572 Pain in left ankle and joints of left foot: Secondary | ICD-10-CM

## 2019-08-11 NOTE — Therapy (Signed)
[address] [place], [place], [address] Phone: [phone]   Fax:  [phone]  Physical Therapy Treatment  Patient Details  Name: Karen Jackson MRN: [id number] Date of Birth: May 16, 1941 Referring Provider (PT): Lucey   Encounter Date: 08/11/2019  PT End of Session - 08/11/19 1143    Visit Number  46    Date for PT Re-Evaluation  08/13/19    PT Start Time  1055    PT Stop Time  1140    PT Time Calculation (min)  45 min    Activity Tolerance  Patient tolerated treatment well    Behavior During Therapy  [facility] for tasks assessed/performed       Past Medical History:  Diagnosis Date  . Acute cystitis without hematuria   . Acute diastolic CHF (congestive heart failure) ([place])   . Arthritis   . Dyspnea   . Dysrhythmia   . Fever of unknown origin 03/19/2017  . Hyperlipidemia   . Hypertension    denies at preop  . Multifocal pneumonia   . Neuromuscular disorder ([place])    neuropathy left arm and foot  . Osteopenia   . Paralysis ([place])    partial left side from CVA   . Persistent atrial fibrillation ([place])   . PONV (postoperative nausea and vomiting)   . Pre-diabetes   . Stroke [facility]) 2013   hemmorahgic    Past Surgical History:  Procedure Laterality Date  . ANKLE SURGERY    . APPENDECTOMY    . CHOLECYSTECTOMY    . HERNIA REPAIR     Esophagus  . JOINT REPLACEMENT     total- right partial- left  . MASTECTOMY PARTIAL / LUMPECTOMY  2012   left  . ORIF ANKLE FRACTURE Left 07/20/2018   Procedure: OPEN REDUCTION INTERNAL FIXATION (ORIF) ANKLE FRACTURE;  Surgeon: Wylene Simmer, MD;  Location: [place];  Service: Orthopedics;  Laterality: Left;  . TOTAL KNEE ARTHROPLASTY Left 01/27/2019   Procedure: TOTAL KNEE ARTHROPLASTY;  Surgeon: Vickey Huger, MD;  [facility];  Service: Orthopedics;  Laterality: Left;    There were no vitals filed for this visit.  Subjective Assessment - 08/11/19 1103    Subjective  Achy and sore, reports cold and wet , "makes me ache all over    Currently in Pain?  Yes    Pain Location  Knee    Pain Orientation  Left    Aggravating Factors   weather         [facility] PT Assessment - 08/11/19 0001      Timed Up and Go Test   Normal TUG (seconds)  60                   [facility] Adult PT Treatment/Exercise - 08/11/19 0001      Ambulation/Gait   Gait Comments  FWW with CGA having her go shorter distances today but negotiating around obstacles nad making turns to more simulate how it would be at home.,, at the end we did two longer walks 100 ', very fatigued      Knee/Hip Exercises: Aerobic   Nustep  L6 x 30mn       Knee/Hip Exercises: Standing   Other Standing Knee Exercises  toe touches onto 4" step lateral and in front 10x each      Knee/Hip Exercises: Seated   Other Seated Knee/Hip Exercises  ankle green tband exercises, blue tband press down, bosu behind working  on abdominals 3x10 crunches    Other Seated Knee/Hip Exercises  left ankle red tband PF/DF, left hip adduction red tband, ankle on sit fit PF/DF      Manual Therapy   Manual Therapy  Passive ROM    Manual therapy comments  PROM of the left shoulder, approximation of the fingers with motions to stimulate increased function, scar mobilization, ankle ROM and approximation    Passive ROM  motion of the knee and then with pressure through the ankle , this seems to help her pain levels               PT Short Term Goals - 03/20/19 1140      PT SHORT TERM GOAL #1   Title  independent with initial HEP    Status  Achieved        PT Long Term Goals - 08/07/19 1156      PT LONG TERM GOAL #2   Title  increased right LE strength to 4-/5    Status  Achieved      PT LONG TERM GOAL #3   Title  walk 100 feet with SBA    Status  Partially Met      PT LONG TERM GOAL #4   Title  transfer independently with set up    Status  Partially Met            Plan - 08/11/19  1143    Clinical Impression Statement  I had her do more smaller walks where she had to negotiate around obstacles to simulate a home environment.  She needed close CGA with this.  She demonstrated a much improved TUG time.  She tend had pretty significant fatigue where when she needed to rest she was going down.  Her transfers are the most unsafe thing as she tends to not get fully turned and two times barely made the seat.    PT Next Visit Plan  continue to work on simulation of gait at home environment    Consulted and Agree with Plan of Care  Patient       Patient will benefit from skilled therapeutic intervention in order to improve the following deficits and impairments:  Abnormal gait, Decreased coordination, Decreased range of motion, Difficulty walking, Cardiopulmonary status limiting activity, Decreased endurance, Increased muscle spasms, Impaired UE functional use, Decreased activity tolerance, Pain, Decreased balance, Postural dysfunction, Decreased strength, Decreased mobility  Visit Diagnosis: Difficulty in walking, not elsewhere classified  Late effects of CVA (cerebrovascular accident)  Muscle weakness (generalized)  Stiffness of left knee, not elsewhere classified  Pain in left ankle and joints of left foot  Acute pain of left knee  Hemiplegia and hemiparesis following cerebral infarction affecting left non-dominant side [facility])     Problem List Patient Active Problem List   Diagnosis Date Noted  . S/P total knee replacement 01/27/2019  . Recurrent left knee instability 07/05/2018  . Respiratory failure with hypoxia ([place]) 08/30/2017  . Hypoxemia   . Heart failure with preserved ejection fraction ([place]), Grade 3 diastolic dysfunction 78/67/5449  . PAF (paroxysmal atrial fibrillation) ([place])   . Dyspnea 03/19/2017  . Encounter for preventive health examination 02/17/2016  . Sensorineural hearing loss (SNHL), bilateral 01/26/2016  . Morbid obesity ([place]) 06/17/2015  .  Hypomagnesemia 04/24/2014  . Hemiparesis affecting left side as late effect of cerebrovascular accident ([place]) 04/24/2014  . Nontraumatic cerebral hemorrhage ([place]) 04/30/2012  . DM (diabetes mellitus) with complications ([place]) 20/05/711  . OSTEOPENIA 01/21/2009  .  UNSPECIFIED VITAMIN D DEFICIENCY 11/19/2007  . ESSENTIAL HYPERTENSION, BENIGN 11/19/2007  . HYPERCHOLESTEROLEMIA 10/25/2006  . GASTROESOPHAGEAL REFLUX, NO ESOPHAGITIS 10/25/2006  . DIVERTICULOSIS OF COLON 10/25/2006  . Osteoarthritis 10/25/2006  . CERVICAL SPINE DISORDER, NOS 10/25/2006    Sumner Boast., PT 08/11/2019, 11:48 AM  [address] [place], [place], [address] Phone: [phone]   Fax:  [phone]  Name: Karen Jackson MRN: [id number] Date of Birth: 06-Feb-1941

## 2019-08-14 ENCOUNTER — Other Ambulatory Visit: Payer: Self-pay

## 2019-08-14 ENCOUNTER — Ambulatory Visit: Payer: Medicare Other | Admitting: Physical Therapy

## 2019-08-14 ENCOUNTER — Encounter: Payer: Self-pay | Admitting: Physical Therapy

## 2019-08-14 DIAGNOSIS — M25562 Pain in left knee: Secondary | ICD-10-CM

## 2019-08-14 DIAGNOSIS — I6990 Unspecified sequelae of unspecified cerebrovascular disease: Secondary | ICD-10-CM

## 2019-08-14 DIAGNOSIS — I69354 Hemiplegia and hemiparesis following cerebral infarction affecting left non-dominant side: Secondary | ICD-10-CM

## 2019-08-14 DIAGNOSIS — R262 Difficulty in walking, not elsewhere classified: Secondary | ICD-10-CM | POA: Diagnosis not present

## 2019-08-14 DIAGNOSIS — M25662 Stiffness of left knee, not elsewhere classified: Secondary | ICD-10-CM

## 2019-08-14 DIAGNOSIS — M6281 Muscle weakness (generalized): Secondary | ICD-10-CM

## 2019-08-14 DIAGNOSIS — M25572 Pain in left ankle and joints of left foot: Secondary | ICD-10-CM

## 2019-08-14 NOTE — Therapy (Signed)
[address] [place], [place], [address] Phone: [phone]   Fax:  [phone]  Physical Therapy Treatment  Patient Details  Name: Karen Jackson MRN: [id number] Date of Birth: October 11, 1940 Referring Provider (PT): Lucey   Encounter Date: 08/14/2019  PT End of Session - 08/14/19 1142    Visit Number  69    Date for PT Re-Evaluation  09/14/19    PT Start Time  1055    PT Stop Time  1138    PT Time Calculation (min)  43 min    Activity Tolerance  Patient tolerated treatment well    Behavior During Therapy  [facility] for tasks assessed/performed       Past Medical History:  Diagnosis Date  . Acute cystitis without hematuria   . Acute diastolic CHF (congestive heart failure) ([place])   . Arthritis   . Dyspnea   . Dysrhythmia   . Fever of unknown origin 03/19/2017  . Hyperlipidemia   . Hypertension    denies at preop  . Multifocal pneumonia   . Neuromuscular disorder ([place])    neuropathy left arm and foot  . Osteopenia   . Paralysis ([place])    partial left side from CVA   . Persistent atrial fibrillation ([place])   . PONV (postoperative nausea and vomiting)   . Pre-diabetes   . Stroke [facility]) 2013   hemmorahgic    Past Surgical History:  Procedure Laterality Date  . ANKLE SURGERY    . APPENDECTOMY    . CHOLECYSTECTOMY    . HERNIA REPAIR     Esophagus  . JOINT REPLACEMENT     total- right partial- left  . MASTECTOMY PARTIAL / LUMPECTOMY  2012   left  . ORIF ANKLE FRACTURE Left 07/20/2018   Procedure: OPEN REDUCTION INTERNAL FIXATION (ORIF) ANKLE FRACTURE;  Surgeon: Wylene Simmer, MD;  Location: [facility];  Service: Orthopedics;  Laterality: Left;  . TOTAL KNEE ARTHROPLASTY Left 01/27/2019   Procedure: TOTAL KNEE ARTHROPLASTY;  Surgeon: Vickey Huger, MD;  [facility];  Service: Orthopedics;  Laterality: Left;    There were no vitals filed for this visit.  Subjective Assessment - 08/14/19 1101    Subjective  Patient reports that she was very tired after last visit    Currently in Pain?  Yes    Pain Score  3     Pain Location  Knee         [facility] PT Assessment - 08/14/19 0001      Assessment   Medical Diagnosis  s/p left TKR, weakness, difficulty walking    Referring Provider (PT)  Lucey      AROM   Left Knee Extension  0    Left Knee Flexion  103      Timed Up and Go Test   Normal TUG (seconds)  60                   [facility] Adult PT Treatment/Exercise - 08/14/19 0001      Ambulation/Gait   Gait Comments  FWW working on negotiating around obstacles, this is difficult for her as she tends to tense up to move the walker and has difficulty with the smaller spasces that I have set up with trying to simulate home situation.  This fatigues her and she needs CGA, we als worked on straight line walking 70' x 3      Knee/Hip Exercises: Aerobic   Nustep  L6 x 37mn       Knee/Hip Exercises: Machines for Strengthening   Cybex Knee Extension  5# 3x10    Cybex Knee Flexion  20#3x10      Knee/Hip Exercises: Standing   Heel Raises  Both;2 sets;10 reps    Other Standing Knee Exercises  alternating feet on 4" step emphasizing control and the weight shift to the left.      Knee/Hip Exercises: Seated   Other Seated Knee/Hip Exercises  ankle green tband exercises, blue tband press down, bosu behind working on abdominals 3x10 crunches               PT Short Term Goals - 03/20/19 1140      PT SHORT TERM GOAL #1   Title  independent with initial HEP    Status  Achieved        PT Long Term Goals - 08/14/19 1145      PT LONG TERM GOAL #1   Title  walk with hand hold assist x 100 feet    Status  Achieved      PT LONG TERM GOAL #2   Title  increased right LE strength to 4-/5    Status  Achieved      PT LONG TERM GOAL #3   Title  walk 100 feet with SBA    Status  Partially Met      PT LONG TERM GOAL #4   Title  transfer independently with set up     Status  Partially Met      PT LONG TERM GOAL #5   Title  report no difficulty pulling her pants up when going to the bathroom    Status  Partially Met      PT LONG TERM GOAL #6   Title  decrease TUG time to 45 seconds    Status  Partially Met            Plan - 08/14/19 1142    Clinical Impression Statement  Again working on the home walking and negotiating obstacle, this is tough for her as she has to negotiate the walker more and this causes her to fatigue quickly, her son will be here next week and I strongly encouraged them to walk at home.  As noted previously she really has decreased her TUG times so she is continuing to show she is progressing with her function and gait.  She has a high prior level of function tha twe are aiming for but the issues of ankle fracture, TKR and CVA as well as all the covid issues as slowed the progress    PT Frequency  2x / week    PT Duration  4 weeks    PT Treatment/Interventions  ADLs/Self Care Home Management;Cryotherapy;Electrical Stimulation;Iontophoresis 461mml Dexamethasone;Moist Heat;Ultrasound;Gait training;Balance training;Therapeutic exercise;Therapeutic activities;Functional mobility training;Patient/family education;Manual techniques;Dry needling    PT Next Visit Plan  continue to work on simulation of gait at home environment    Consulted and Agree with Plan of Care  Patient       Patient will benefit from skilled therapeutic intervention in order to improve the following deficits and impairments:  Abnormal gait, Decreased coordination, Decreased range of motion, Difficulty walking, Cardiopulmonary status limiting activity, Decreased endurance, Increased muscle spasms, Impaired UE functional use, Decreased activity tolerance, Pain, Decreased balance, Postural dysfunction, Decreased strength, Decreased mobility  Visit Diagnosis: Difficulty in walking, not elsewhere classified - Plan: PT plan of care cert/re-cert  Late effects of CVA  (  cerebrovascular accident) - Plan: PT plan of care cert/re-cert  Muscle weakness (generalized) - Plan: PT plan of care cert/re-cert  Stiffness of left knee, not elsewhere classified - Plan: PT plan of care cert/re-cert  Pain in left ankle and joints of left foot - Plan: PT plan of care cert/re-cert  Acute pain of left knee - Plan: PT plan of care cert/re-cert  Hemiplegia and hemiparesis following cerebral infarction affecting left non-dominant side ([employer]) - Plan: PT plan of care cert/re-cert     Problem List Patient Active Problem List   Diagnosis Date Noted  . S/P total knee replacement 01/27/2019  . Recurrent left knee instability 07/05/2018  . Respiratory failure with hypoxia ([place]) 08/30/2017  . Hypoxemia   . Heart failure with preserved ejection fraction ([place]), Grade 3 diastolic dysfunction 31/79/1524  . PAF (paroxysmal atrial fibrillation) ([place])   . Dyspnea 03/19/2017  . Encounter for preventive health examination 02/17/2016  . Sensorineural hearing loss (SNHL), bilateral 01/26/2016  . Morbid obesity ([place]) 06/17/2015  . Hypomagnesemia 04/24/2014  . Hemiparesis affecting left side as late effect of cerebrovascular accident ([place]) 04/24/2014  . Nontraumatic cerebral hemorrhage ([place]) 04/30/2012  . DM (diabetes mellitus) with complications ([place]) 84/94/8355  . OSTEOPENIA 01/21/2009  . UNSPECIFIED VITAMIN D DEFICIENCY 11/19/2007  . ESSENTIAL HYPERTENSION, BENIGN 11/19/2007  . HYPERCHOLESTEROLEMIA 10/25/2006  . GASTROESOPHAGEAL REFLUX, NO ESOPHAGITIS 10/25/2006  . DIVERTICULOSIS OF COLON 10/25/2006  . Osteoarthritis 10/25/2006  . CERVICAL SPINE DISORDER, NOS 10/25/2006    Sumner Boast., PT 08/14/2019, 11:47 AM  [address] [place], [place], [address] Phone: [phone]   Fax:  [phone]  Name: Karen Jackson MRN: [id number] Date of Birth: [birth year]

## 2019-08-18 ENCOUNTER — Other Ambulatory Visit: Payer: Self-pay

## 2019-08-18 ENCOUNTER — Encounter: Payer: Self-pay | Admitting: Physical Therapy

## 2019-08-18 ENCOUNTER — Ambulatory Visit: Payer: Medicare Other | Admitting: Physical Therapy

## 2019-08-18 DIAGNOSIS — M6281 Muscle weakness (generalized): Secondary | ICD-10-CM

## 2019-08-18 DIAGNOSIS — M25562 Pain in left knee: Secondary | ICD-10-CM

## 2019-08-18 DIAGNOSIS — R262 Difficulty in walking, not elsewhere classified: Secondary | ICD-10-CM

## 2019-08-18 DIAGNOSIS — I6990 Unspecified sequelae of unspecified cerebrovascular disease: Secondary | ICD-10-CM

## 2019-08-18 DIAGNOSIS — M25572 Pain in left ankle and joints of left foot: Secondary | ICD-10-CM

## 2019-08-18 DIAGNOSIS — I69354 Hemiplegia and hemiparesis following cerebral infarction affecting left non-dominant side: Secondary | ICD-10-CM

## 2019-08-18 DIAGNOSIS — M25662 Stiffness of left knee, not elsewhere classified: Secondary | ICD-10-CM

## 2019-08-18 NOTE — Therapy (Signed)
[address] [place], [place], [address] Phone: [phone]   Fax:  [phone]  Physical Therapy Treatment  Patient Details  Name: Karen Jackson MRN: [id number] Date of Birth: [birth year] Referring Provider (PT): Lucey   Encounter Date: 08/18/2019  PT End of Session - 08/18/19 1309    Visit Number  1    Date for PT Re-Evaluation  09/14/19    PT Start Time  1054    PT Stop Time  1139    PT Time Calculation (min)  45 min    Activity Tolerance  Patient limited by fatigue    Behavior During Therapy  [facility] for tasks assessed/performed       Past Medical History:  Diagnosis Date  . Acute cystitis without hematuria   . Acute diastolic CHF (congestive heart failure) ([place])   . Arthritis   . Dyspnea   . Dysrhythmia   . Fever of unknown origin 03/19/2017  . Hyperlipidemia   . Hypertension    denies at preop  . Multifocal pneumonia   . Neuromuscular disorder ([place])    neuropathy left arm and foot  . Osteopenia   . Paralysis ([place])    partial left side from CVA   . Persistent atrial fibrillation ([place])   . PONV (postoperative nausea and vomiting)   . Pre-diabetes   . Stroke [facility]) 2013   hemmorahgic    Past Surgical History:  Procedure Laterality Date  . ANKLE SURGERY    . APPENDECTOMY    . CHOLECYSTECTOMY    . HERNIA REPAIR     Esophagus  . JOINT REPLACEMENT     total- right partial- left  . MASTECTOMY PARTIAL / LUMPECTOMY  2012   left  . ORIF ANKLE FRACTURE Left 07/20/2018   Procedure: OPEN REDUCTION INTERNAL FIXATION (ORIF) ANKLE FRACTURE;  Surgeon: Wylene Simmer, MD;  Location: [facility];  Service: Orthopedics;  Laterality: Left;  . TOTAL KNEE ARTHROPLASTY Left 01/27/2019   Procedure: TOTAL KNEE ARTHROPLASTY;  Surgeon: Vickey Huger, MD;  [facility];  Service: Orthopedics;  Laterality: Left;    There were no vitals filed for this visit.  Subjective Assessment - 08/18/19 1104    Subjective   Patient reports that she did not get to transfer or walk over the weekend due to lack of help that was willing to do this for her, c/o stiffness and some pain in the knee today    Currently in Pain?  Yes    Pain Score  5     Pain Location  Knee    Pain Orientation  Left    Pain Descriptors / Indicators  Sore    Aggravating Factors   not moving                       [facility] Adult PT Treatment/Exercise - 08/18/19 0001      Ambulation/Gait   Gait Comments  with FWW, some negotiating things trying to increase her independence and safety, she really struggled with this due to short ness of breath      Knee/Hip Exercises: Aerobic   Nustep  500 steps without rest      Knee/Hip Exercises: Standing   Hip Abduction  Both;2 sets;10 reps    Hip Extension  Both;2 sets;10 reps    Other Standing Knee Exercises  alternating feet on 4" step emphasizing control and the weight shift to the left., then did focus on  just the right leg toe touches so she would get used to the weightbearing on the left      Manual Therapy   Manual Therapy  Passive ROM    Passive ROM  passive motions of the left knee mostly flexion, then some easy movements of the ankle and the knee with some approximation as this seems to help her pain.               PT Short Term Goals - 03/20/19 1140      PT SHORT TERM GOAL #1   Title  independent with initial HEP    Status  Achieved        PT Long Term Goals - 08/18/19 1311      PT LONG TERM GOAL #3   Title  walk 100 feet with SBA    Status  Partially Met            Plan - 08/18/19 1309    Clinical Impression Statement  Patient struggles some today with shortness of breath during the walking.  She also had some increased c/o soreness, again did not get up on her feet over the weekend.  She is very hesitant with her bearing weight on the left, needs min A to get the left knee straight and then to bear weight and shift onto this side     Examination-Activity Limitations  Bathing;Bed Mobility;Lift;Squat;Stairs;Locomotion Level;Stand;Toileting;Transfers;Dressing    PT Next Visit Plan  continue to work on simulation of gait at home environment    Consulted and Agree with Plan of Care  Patient       Patient will benefit from skilled therapeutic intervention in order to improve the following deficits and impairments:     Visit Diagnosis: Difficulty in walking, not elsewhere classified  Late effects of CVA (cerebrovascular accident)  Muscle weakness (generalized)  Stiffness of left knee, not elsewhere classified  Pain in left ankle and joints of left foot  Acute pain of left knee  Hemiplegia and hemiparesis following cerebral infarction affecting left non-dominant side [facility])     Problem List Patient Active Problem List   Diagnosis Date Noted  . S/P total knee replacement 01/27/2019  . Recurrent left knee instability 07/05/2018  . Respiratory failure with hypoxia ([place]) 08/30/2017  . Hypoxemia   . Heart failure with preserved ejection fraction ([place]), Grade 3 diastolic dysfunction 42/59/5638  . PAF (paroxysmal atrial fibrillation) ([place])   . Dyspnea 03/19/2017  . Encounter for preventive health examination 02/17/2016  . Sensorineural hearing loss (SNHL), bilateral 01/26/2016  . Morbid obesity ([place]) 06/17/2015  . Hypomagnesemia 04/24/2014  . Hemiparesis affecting left side as late effect of cerebrovascular accident ([place]) 04/24/2014  . Nontraumatic cerebral hemorrhage ([place]) 04/30/2012  . DM (diabetes mellitus) with complications ([place]) 75/64/3329  . OSTEOPENIA 01/21/2009  . UNSPECIFIED VITAMIN D DEFICIENCY 11/19/2007  . ESSENTIAL HYPERTENSION, BENIGN 11/19/2007  . HYPERCHOLESTEROLEMIA 10/25/2006  . GASTROESOPHAGEAL REFLUX, NO ESOPHAGITIS 10/25/2006  . DIVERTICULOSIS OF COLON 10/25/2006  . Osteoarthritis 10/25/2006  . CERVICAL SPINE DISORDER, NOS 10/25/2006    Sumner Boast., PT 08/18/2019, 1:12  PM  [address] [place], [place], [address] Phone: [phone]   Fax:  [phone]  Name: Karen Jackson MRN: [id number] Date of Birth: 13-May-1941

## 2019-08-19 MED ORDER — CELECOXIB 200 MG PO CAPS: ORAL_CAPSULE | ORAL | 0 refills | Status: DC

## 2019-08-19 NOTE — Telephone Encounter (Signed)
Reviewed chart Seems long term med Dr. Andria Frames out of office so I will refill

## 2019-08-26 ENCOUNTER — Ambulatory Visit: Payer: Medicare Other | Admitting: Physical Therapy

## 2019-08-26 ENCOUNTER — Other Ambulatory Visit: Payer: Self-pay

## 2019-08-26 DIAGNOSIS — I6990 Unspecified sequelae of unspecified cerebrovascular disease: Secondary | ICD-10-CM

## 2019-08-26 DIAGNOSIS — M25662 Stiffness of left knee, not elsewhere classified: Secondary | ICD-10-CM

## 2019-08-26 DIAGNOSIS — R262 Difficulty in walking, not elsewhere classified: Secondary | ICD-10-CM

## 2019-08-26 DIAGNOSIS — M6281 Muscle weakness (generalized): Secondary | ICD-10-CM

## 2019-08-26 NOTE — Therapy (Signed)
[address] [place], [place], [address] Phone: [phone]   Fax:  [phone]  Physical Therapy Treatment  Patient Details  Name: Karen Jackson MRN: [id number] Date of Birth: [birth year] Referring Provider (PT): Lucey   Encounter Date: 08/26/2019  PT End of Session - 08/26/19 1142    Visit Number  43    Date for PT Re-Evaluation  09/14/19    PT Start Time  0923    PT Stop Time  1140    PT Time Calculation (min)  47 min       Past Medical History:  Diagnosis Date  . Acute cystitis without hematuria   . Acute diastolic CHF (congestive heart failure) ([place])   . Arthritis   . Dyspnea   . Dysrhythmia   . Fever of unknown origin 03/19/2017  . Hyperlipidemia   . Hypertension    denies at preop  . Multifocal pneumonia   . Neuromuscular disorder ([place])    neuropathy left arm and foot  . Osteopenia   . Paralysis ([place])    partial left side from CVA   . Persistent atrial fibrillation ([place])   . PONV (postoperative nausea and vomiting)   . Pre-diabetes   . Stroke [facility]) 2013   hemmorahgic    Past Surgical History:  Procedure Laterality Date  . ANKLE SURGERY    . APPENDECTOMY    . CHOLECYSTECTOMY    . HERNIA REPAIR     Esophagus  . JOINT REPLACEMENT     total- right partial- left  . MASTECTOMY PARTIAL / LUMPECTOMY  2012   left  . ORIF ANKLE FRACTURE Left 07/20/2018   Procedure: OPEN REDUCTION INTERNAL FIXATION (ORIF) ANKLE FRACTURE;  Surgeon: Wylene Simmer, MD;  Location: [facility];  Service: Orthopedics;  Laterality: Left;  . TOTAL KNEE ARTHROPLASTY Left 01/27/2019   Procedure: TOTAL KNEE ARTHROPLASTY;  Surgeon: Vickey Huger, MD;  [facility];  Service: Orthopedics;  Laterality: Left;    There were no vitals filed for this visit.  Subjective Assessment - 08/26/19 1059    Subjective  "glad you could work me in, you know I go to hell in a hand basket without therapy and its been a week." So stiff  and trouble at home with transfers.    Currently in Pain?  Yes    Pain Score  5     Pain Location  Knee    Pain Orientation  Left                       [facility] Adult PT Treatment/Exercise - 08/26/19 0001      Ambulation/Gait   Gait Comments  with FFW CGA 50 feet ,seaetd rest and then 70 feet - with 1 short standing rest break. Pt is walking at home with regular CG      Knee/Hip Exercises: Aerobic   Nustep  L 5 6 min      Knee/Hip Exercises: Seated   Long Arc Quad  Strengthening;Both;2 sets;10 reps   tband   Other Seated Knee/Hip Exercises  trunk flex and ext with tband for core    Hamstring Curl  Strengthening;Both;2 sets;10 reps   tband   Sit to Sand  20 reps;with UE support   HHA with 4 inch block under working on coming to full stand     Manual Therapy   Manual Therapy  Passive ROM    Passive ROM  left UE  with approximation, left knee PROM               PT Short Term Goals - 03/20/19 1140      PT SHORT TERM GOAL #1   Title  independent with initial HEP    Status  Achieved        PT Long Term Goals - 08/18/19 1311      PT LONG TERM GOAL #3   Title  walk 100 feet with SBA    Status  Partially Met            Plan - 08/26/19 1143    Clinical Impression Statement  pt has not had PT for 1 week and was noticeably  stiffer and harder time transfering but after some ex and stretches pt did well with gait. pt and regular CG verb they are walking daily at home when she is there.    PT Treatment/Interventions  ADLs/Self Care Home Management;Cryotherapy;Electrical Stimulation;Iontophoresis 50m/ml Dexamethasone;Moist Heat;Ultrasound;Gait training;Balance training;Therapeutic exercise;Therapeutic activities;Functional mobility training;Patient/family education;Manual techniques;Dry needling    PT Next Visit Plan  continue to work on simulation of gait at home environment. check goals       Patient will benefit from skilled therapeutic intervention  in order to improve the following deficits and impairments:  Abnormal gait, Decreased coordination, Decreased range of motion, Difficulty walking, Cardiopulmonary status limiting activity, Decreased endurance, Increased muscle spasms, Impaired UE functional use, Decreased activity tolerance, Pain, Decreased balance, Postural dysfunction, Decreased strength, Decreased mobility  Visit Diagnosis: Difficulty in walking, not elsewhere classified  Late effects of CVA (cerebrovascular accident)  Muscle weakness (generalized)  Stiffness of left knee, not elsewhere classified     Problem List Patient Active Problem List   Diagnosis Date Noted  . S/P total knee replacement 01/27/2019  . Recurrent left knee instability 07/05/2018  . Respiratory failure with hypoxia ([place] 08/30/2017  . Hypoxemia   . Heart failure with preserved ejection fraction ([place], Grade 3 diastolic dysfunction 053/66/4403 . PAF (paroxysmal atrial fibrillation) ([place]   . Dyspnea 03/19/2017  . Encounter for preventive health examination 02/17/2016  . Sensorineural hearing loss (SNHL), bilateral 01/26/2016  . Morbid obesity ([place] 06/17/2015  . Hypomagnesemia 04/24/2014  . Hemiparesis affecting left side as late effect of cerebrovascular accident ([place] 04/24/2014  . Nontraumatic cerebral hemorrhage ([place] 04/30/2012  . DM (diabetes mellitus) with complications ([place] 047/42/5956 . OSTEOPENIA 01/21/2009  . UNSPECIFIED VITAMIN D DEFICIENCY 11/19/2007  . ESSENTIAL HYPERTENSION, BENIGN 11/19/2007  . HYPERCHOLESTEROLEMIA 10/25/2006  . GASTROESOPHAGEAL REFLUX, NO ESOPHAGITIS 10/25/2006  . DIVERTICULOSIS OF COLON 10/25/2006  . Osteoarthritis 10/25/2006  . CERVICAL SPINE DISORDER, NOS 10/25/2006    ,ANGIE PTA 08/26/2019, 11:45 AM  [address] [address]Phone: [phone]  Fax:  [phone] Name: Karen RIVENBARKMRN:  [id number] of Birth: 1Sep 03, 1942

## 2019-08-28 ENCOUNTER — Ambulatory Visit: Payer: Medicare Other | Admitting: Physical Therapy

## 2019-08-28 ENCOUNTER — Encounter: Payer: Self-pay | Admitting: Physical Therapy

## 2019-08-28 ENCOUNTER — Other Ambulatory Visit: Payer: Self-pay

## 2019-08-28 DIAGNOSIS — M25662 Stiffness of left knee, not elsewhere classified: Secondary | ICD-10-CM

## 2019-08-28 DIAGNOSIS — M25562 Pain in left knee: Secondary | ICD-10-CM

## 2019-08-28 DIAGNOSIS — M6281 Muscle weakness (generalized): Secondary | ICD-10-CM

## 2019-08-28 DIAGNOSIS — R262 Difficulty in walking, not elsewhere classified: Secondary | ICD-10-CM

## 2019-08-28 DIAGNOSIS — M25572 Pain in left ankle and joints of left foot: Secondary | ICD-10-CM

## 2019-08-28 DIAGNOSIS — I69354 Hemiplegia and hemiparesis following cerebral infarction affecting left non-dominant side: Secondary | ICD-10-CM

## 2019-08-28 DIAGNOSIS — I6990 Unspecified sequelae of unspecified cerebrovascular disease: Secondary | ICD-10-CM

## 2019-08-28 NOTE — Therapy (Signed)
[place] [address] [place], [place], [address] Phone: [phone]   Fax:  [phone]  Physical Therapy Treatment  Patient Details  Name: Karen Jackson MRN: [id number] Date of Birth: [birth year] Referring Provider (PT): Lucey   Encounter Date: 08/28/2019  PT End of Session - 08/28/19 1239    Visit Number  56    Date for PT Re-Evaluation  09/14/19    PT Start Time  6948    PT Stop Time  1138    PT Time Calculation (min)  47 min    Activity Tolerance  Patient tolerated treatment well    Behavior During Therapy  [facility] for tasks assessed/performed       Past Medical History:  Diagnosis Date  . Acute cystitis without hematuria   . Acute diastolic CHF (congestive heart failure) ([place])   . Arthritis   . Dyspnea   . Dysrhythmia   . Fever of unknown origin 03/19/2017  . Hyperlipidemia   . Hypertension    denies at preop  . Multifocal pneumonia   . Neuromuscular disorder ([place])    neuropathy left arm and foot  . Osteopenia   . Paralysis ([place])    partial left side from CVA   . Persistent atrial fibrillation ([place])   . PONV (postoperative nausea and vomiting)   . Pre-diabetes   . Stroke [facility]) 2013   hemmorahgic    Past Surgical History:  Procedure Laterality Date  . ANKLE SURGERY    . APPENDECTOMY    . CHOLECYSTECTOMY    . HERNIA REPAIR     Esophagus  . JOINT REPLACEMENT     total- right partial- left  . MASTECTOMY PARTIAL / LUMPECTOMY  2012   left  . ORIF ANKLE FRACTURE Left 07/20/2018   Procedure: OPEN REDUCTION INTERNAL FIXATION (ORIF) ANKLE FRACTURE;  Surgeon: Wylene Simmer, MD;  Location: [facility];  Service: Orthopedics;  Laterality: Left;  . TOTAL KNEE ARTHROPLASTY Left 01/27/2019   Procedure: TOTAL KNEE ARTHROPLASTY;  Surgeon: Vickey Huger, MD;  [facility];  Service: Orthopedics;  Laterality: Left;    There were no vitals filed for this visit.  Subjective Assessment - 08/28/19 1058    Subjective  I didn't sleep last night, I feel really stiff    Currently in Pain?  Yes    Pain Score  4     Pain Location  Knee    Pain Orientation  Left    Aggravating Factors   the wet weather, not sleeping makes me feel bad                       [facility] Adult PT Treatment/Exercise - 08/28/19 0001      Ambulation/Gait   Gait Comments  gait with FWW working on household typ ambulation where she has to negotiate around things in tight spaces, CGA and some verbal cues as she will be unsafe at times especially as she gets fatigued and tends to really not be safe with transfers at times      Knee/Hip Exercises: Standing   Other Standing Knee Exercises  toe touches onto 4" step lateral and in front 10x each      Knee/Hip Exercises: Seated   Other Seated Knee/Hip Exercises  trunk flex and ext with tband for core    Hamstring Curl  Strengthening;Both;2 sets;10 reps    Hamstring Limitations  green tband      Manual Therapy  Manual Therapy  Passive ROM    Manual therapy comments  some STM for the low back due to an increase in pain    Passive ROM  left UE with approximation, left knee PROM               PT Short Term Goals - 03/20/19 1140      PT SHORT TERM GOAL #1   Title  independent with initial HEP    Status  Achieved        PT Long Term Goals - 08/28/19 1245      PT LONG TERM GOAL #4   Title  transfer independently with set up    Status  Partially Met            Plan - 08/28/19 1240    Clinical Impression Statement  Patient reports being very stiff today, has some LBP, did not sleep well last night.  She is feeling a little more confident and I am working on getting her to be more functionally independent, especially with transfers and walking in the home, she tends to rely on others and at times she really struggles to transfer and walk.  The simulation for home with negotiating around and between things is difficult for her as she has a lot of  difficulty with moving the walker side to side due to the stroke.  She was very tight and tender in the low back mms today, the left UE is also causing some increase problems with significant bone on bone crepitus with her using the walker    PT Next Visit Plan  will work [name] he functional in home stuff    Consulted and Agree with Plan of Care  Patient       Patient will benefit from skilled therapeutic intervention in order to improve the following deficits and impairments:  Abnormal gait, Decreased coordination, Decreased range of motion, Difficulty walking, Cardiopulmonary status limiting activity, Decreased endurance, Increased muscle spasms, Impaired UE functional use, Decreased activity tolerance, Pain, Decreased balance, Postural dysfunction, Decreased strength, Decreased mobility  Visit Diagnosis: Difficulty in walking, not elsewhere classified  Late effects of CVA (cerebrovascular accident)  Muscle weakness (generalized)  Stiffness of left knee, not elsewhere classified  Pain in left ankle and joints of left foot  Acute pain of left knee  Hemiplegia and hemiparesis following cerebral infarction affecting left non-dominant side [facility])     Problem List Patient Active Problem List   Diagnosis Date Noted  . S/P total knee replacement 01/27/2019  . Recurrent left knee instability 07/05/2018  . Respiratory failure with hypoxia ([place]) 08/30/2017  . Hypoxemia   . Heart failure with preserved ejection fraction ([place]), Grade 3 diastolic dysfunction 68/61/6837  . PAF (paroxysmal atrial fibrillation) ([place])   . Dyspnea 03/19/2017  . Encounter for preventive health examination 02/17/2016  . Sensorineural hearing loss (SNHL), bilateral 01/26/2016  . Morbid obesity ([place]) 06/17/2015  . Hypomagnesemia 04/24/2014  . Hemiparesis affecting left side as late effect of cerebrovascular accident ([place]) 04/24/2014  . Nontraumatic cerebral hemorrhage ([place]) 04/30/2012  . DM (diabetes mellitus)  with complications ([place]) 29/09/1113  . OSTEOPENIA 01/21/2009  . UNSPECIFIED VITAMIN D DEFICIENCY 11/19/2007  . ESSENTIAL HYPERTENSION, BENIGN 11/19/2007  . HYPERCHOLESTEROLEMIA 10/25/2006  . GASTROESOPHAGEAL REFLUX, NO ESOPHAGITIS 10/25/2006  . DIVERTICULOSIS OF COLON 10/25/2006  . Osteoarthritis 10/25/2006  . CERVICAL SPINE DISORDER, NOS 10/25/2006    Sumner Boast., PT 08/28/2019, 12:46 PM  [facility] [phone]  [facility] [place], [place], [address] Phone: [phone]   Fax:  [phone]  Name: Karen Jackson MRN: [id number] Date of Birth: [birth year]

## 2019-09-01 ENCOUNTER — Ambulatory Visit: Payer: Medicare Other | Attending: Orthopedic Surgery | Admitting: Physical Therapy

## 2019-09-01 ENCOUNTER — Encounter: Payer: Self-pay | Admitting: Physical Therapy

## 2019-09-01 ENCOUNTER — Other Ambulatory Visit: Payer: Self-pay

## 2019-09-01 DIAGNOSIS — R262 Difficulty in walking, not elsewhere classified: Secondary | ICD-10-CM | POA: Insufficient documentation

## 2019-09-01 DIAGNOSIS — M25572 Pain in left ankle and joints of left foot: Secondary | ICD-10-CM | POA: Diagnosis present

## 2019-09-01 DIAGNOSIS — I69354 Hemiplegia and hemiparesis following cerebral infarction affecting left non-dominant side: Secondary | ICD-10-CM | POA: Diagnosis present

## 2019-09-01 DIAGNOSIS — I6990 Unspecified sequelae of unspecified cerebrovascular disease: Secondary | ICD-10-CM | POA: Diagnosis present

## 2019-09-01 DIAGNOSIS — M6281 Muscle weakness (generalized): Secondary | ICD-10-CM | POA: Diagnosis present

## 2019-09-01 DIAGNOSIS — M25662 Stiffness of left knee, not elsewhere classified: Secondary | ICD-10-CM | POA: Diagnosis present

## 2019-09-01 DIAGNOSIS — M25562 Pain in left knee: Secondary | ICD-10-CM | POA: Insufficient documentation

## 2019-09-01 NOTE — Therapy (Signed)
[place], [place], [address] Phone: [phone]   Fax:  [phone]  Physical Therapy Treatment  Patient Details  Name: Karen Jackson MRN: [id number] Date of Birth: [birth year] Referring Provider (PT): Lucey   Encounter Date: 09/01/2019  PT End of Session - 09/01/19 1148    Visit Number  33    Date for PT Re-Evaluation  09/14/19    PT Start Time  1055    PT Stop Time  1143    PT Time Calculation (min)  48 min    Activity Tolerance  Patient limited by pain    Behavior During Therapy  [facility] for tasks assessed/performed       Past Medical History:  Diagnosis Date  . Acute cystitis without hematuria   . Acute diastolic CHF (congestive heart failure) ([place])   . Arthritis   . Dyspnea   . Dysrhythmia   . Fever of unknown origin 03/19/2017  . Hyperlipidemia   . Hypertension    denies at preop  . Multifocal pneumonia   . Neuromuscular disorder ([place])    neuropathy left arm and foot  . Osteopenia   . Paralysis ([place])    partial left side from CVA   . Persistent atrial fibrillation ([place])   . PONV (postoperative nausea and vomiting)   . Pre-diabetes   . Stroke [facility]) 2013   hemmorahgic    Past Surgical History:  Procedure Laterality Date  . ANKLE SURGERY    . APPENDECTOMY    . CHOLECYSTECTOMY    . HERNIA REPAIR     Esophagus  . JOINT REPLACEMENT     total- right partial- left  . MASTECTOMY PARTIAL / LUMPECTOMY  2012   left  . ORIF ANKLE FRACTURE Left 07/20/2018   Procedure: OPEN REDUCTION INTERNAL FIXATION (ORIF) ANKLE FRACTURE;  Surgeon: Wylene Simmer, MD;  Location: [facility];  Service: Orthopedics;  Laterality: Left;  . TOTAL KNEE ARTHROPLASTY Left 01/27/2019   Procedure: TOTAL KNEE ARTHROPLASTY;  Surgeon: Vickey Huger, MD;  [facility];  Service: Orthopedics;  Laterality: Left;    There were no vitals filed for this visit.  Subjective Assessment - 09/01/19 1100    Subjective  Patient  reports that she has had a lot of right hip pain over the weekend, she is unsure of the cause, just very tender    Currently in Pain?  Yes    Pain Location  Hip    Pain Orientation  Right    Aggravating Factors   unsure of why she is hurting more                       [facility] Adult PT Treatment/Exercise - 09/01/19 0001      Ambulation/Gait   Gait Comments  gait with set up 2x 75 feet, this did cause some of the pain in the right buttock and th ehip and low back area      Knee/Hip Exercises: Stretches   Piriformis Stretch  Right;4 reps;20 seconds      Knee/Hip Exercises: Aerobic   Nustep  L 5 6 min      Electrical Stimulation   Electrical Stimulation Location  right low back and buttock    Electrical Stimulation Action  IFC    Electrical Stimulation Parameters  sitting    Electrical Stimulation Goals  Pain      Manual Therapy   Manual Therapy  Soft tissue mobilization  Soft tissue mobilization  right buttock. low back and the let upper trap             PT Education - 09/01/19 1146    Education Details  taught her and the caregiver to do piriformis stretches, performed witth them, she was very tight and sore with this    Person(s) Educated  Patient;Caregiver(s)    Methods  Explanation;Demonstration;Tactile cues;Verbal cues    Comprehension  Verbalized understanding;Returned demonstration;Verbal cues required;Tactile cues required       PT Short Term Goals - 03/20/19 1140      PT SHORT TERM GOAL #1   Title  independent with initial HEP    Status  Achieved        PT Long Term Goals - 08/28/19 1245      PT LONG TERM GOAL #4   Title  transfer independently with set up    Status  Partially Met            Plan - 09/01/19 1149    Clinical Impression Statement  Patient reports tha tthe low back pain she had on her last treatment continued to bother her all weekend, she reports pain a 6/10, she is very tender in the right buttock and the right  low back, she is very tight here.  Tried modality and STM to this area, very tender but reported less pain after, she is very tight in the piriformis mms and I gave her and the caregiver this stretch and demo with them    Examination-Activity Limitations  Bathing;Bed Mobility;Lift;Squat;Stairs;Locomotion Level;Stand;Toileting;Transfers;Dressing    PT Next Visit Plan  see if we can get her pain level back down as it is limiting her walking, transfers and function    Consulted and Agree with Plan of Care  Patient       Patient will benefit from skilled therapeutic intervention in order to improve the following deficits and impairments:  Abnormal gait, Decreased coordination, Decreased range of motion, Difficulty walking, Cardiopulmonary status limiting activity, Decreased endurance, Increased muscle spasms, Impaired UE functional use, Decreased activity tolerance, Pain, Decreased balance, Postural dysfunction, Decreased strength, Decreased mobility  Visit Diagnosis: Difficulty in walking, not elsewhere classified  Late effects of CVA (cerebrovascular accident)  Muscle weakness (generalized)  Stiffness of left knee, not elsewhere classified  Pain in left ankle and joints of left foot  Acute pain of left knee  Hemiplegia and hemiparesis following cerebral infarction affecting left non-dominant side [facility])     Problem List Patient Active Problem List   Diagnosis Date Noted  . S/P total knee replacement 01/27/2019  . Recurrent left knee instability 07/05/2018  . Respiratory failure with hypoxia ([place]) 08/30/2017  . Hypoxemia   . Heart failure with preserved ejection fraction ([place]), Grade 3 diastolic dysfunction 45/80/9983  . PAF (paroxysmal atrial fibrillation) ([place])   . Dyspnea 03/19/2017  . Encounter for preventive health examination 02/17/2016  . Sensorineural hearing loss (SNHL), bilateral 01/26/2016  . Morbid obesity ([place]) 06/17/2015  . Hypomagnesemia 04/24/2014  . Hemiparesis  affecting left side as late effect of cerebrovascular accident ([place]) 04/24/2014  . Nontraumatic cerebral hemorrhage ([place]) 04/30/2012  . DM (diabetes mellitus) with complications ([place]) 38/25/0539  . OSTEOPENIA 01/21/2009  . UNSPECIFIED VITAMIN D DEFICIENCY 11/19/2007  . ESSENTIAL HYPERTENSION, BENIGN 11/19/2007  . HYPERCHOLESTEROLEMIA 10/25/2006  . GASTROESOPHAGEAL REFLUX, NO ESOPHAGITIS 10/25/2006  . DIVERTICULOSIS OF COLON 10/25/2006  . Osteoarthritis 10/25/2006  . CERVICAL SPINE DISORDER, NOS 10/25/2006    Sumner Boast., PT 09/01/2019, 11:52  [place], [place], [address] Phone: [phone]   Fax:  [phone]  Name: TAMIEKA RANCOURT MRN: [id number] Date of Birth: 22-Dec-1940

## 2019-09-05 ENCOUNTER — Other Ambulatory Visit: Payer: Self-pay

## 2019-09-05 ENCOUNTER — Ambulatory Visit: Payer: Medicare Other | Admitting: Physical Therapy

## 2019-09-08 ENCOUNTER — Ambulatory Visit: Payer: Medicare Other | Admitting: Physical Therapy

## 2019-09-08 ENCOUNTER — Other Ambulatory Visit: Payer: Self-pay

## 2019-09-08 ENCOUNTER — Encounter: Payer: Self-pay | Admitting: Physical Therapy

## 2019-09-08 DIAGNOSIS — M25562 Pain in left knee: Secondary | ICD-10-CM

## 2019-09-08 DIAGNOSIS — M6281 Muscle weakness (generalized): Secondary | ICD-10-CM

## 2019-09-08 DIAGNOSIS — M25572 Pain in left ankle and joints of left foot: Secondary | ICD-10-CM

## 2019-09-08 DIAGNOSIS — R262 Difficulty in walking, not elsewhere classified: Secondary | ICD-10-CM

## 2019-09-08 DIAGNOSIS — I6990 Unspecified sequelae of unspecified cerebrovascular disease: Secondary | ICD-10-CM

## 2019-09-08 DIAGNOSIS — I69354 Hemiplegia and hemiparesis following cerebral infarction affecting left non-dominant side: Secondary | ICD-10-CM

## 2019-09-08 DIAGNOSIS — M25662 Stiffness of left knee, not elsewhere classified: Secondary | ICD-10-CM

## 2019-09-08 NOTE — Therapy (Signed)
[place] [place], [place], [address] Phone: [phone]   Fax:  [phone]  Physical Therapy Treatment  Patient Details  Name: Karen Jackson MRN: [id number] Date of Birth: 05/15/1941 Referring Provider (PT): Lucey   Encounter Date: 09/08/2019  PT End of Session - 09/08/19 1144    Visit Number  43    Date for PT Re-Evaluation  09/14/19    PT Start Time  1014    PT Stop Time  1055    PT Time Calculation (min)  41 min    Activity Tolerance  Patient limited by pain    Behavior During Therapy  [facility] for tasks assessed/performed       Past Medical History:  Diagnosis Date  . Acute cystitis without hematuria   . Acute diastolic CHF (congestive heart failure) ([place])   . Arthritis   . Dyspnea   . Dysrhythmia   . Fever of unknown origin 03/19/2017  . Hyperlipidemia   . Hypertension    denies at preop  . Multifocal pneumonia   . Neuromuscular disorder ([place])    neuropathy left arm and foot  . Osteopenia   . Paralysis ([place])    partial left side from CVA   . Persistent atrial fibrillation ([place])   . PONV (postoperative nausea and vomiting)   . Pre-diabetes   . Stroke [facility]) 2013   hemmorahgic    Past Surgical History:  Procedure Laterality Date  . ANKLE SURGERY    . APPENDECTOMY    . CHOLECYSTECTOMY    . HERNIA REPAIR     Esophagus  . JOINT REPLACEMENT     total- right partial- left  . MASTECTOMY PARTIAL / LUMPECTOMY  2012   left  . ORIF ANKLE FRACTURE Left 07/20/2018   Procedure: OPEN REDUCTION INTERNAL FIXATION (ORIF) ANKLE FRACTURE;  Surgeon: Wylene Simmer, MD;  Location: [facility];  Service: Orthopedics;  Laterality: Left;  . TOTAL KNEE ARTHROPLASTY Left 01/27/2019   Procedure: TOTAL KNEE ARTHROPLASTY;  Surgeon: Vickey Huger, MD;  [facility];  Service: Orthopedics;  Laterality: Left;    There were no vitals filed for this visit.  Subjective Assessment - 09/08/19 1020    Subjective   Reports that she saw the MD, had an injection in the shoulder due to severe bone on bone.    Currently in Pain?  Yes    Pain Score  5     Pain Location  Shoulder    Pain Orientation  Left    Aggravating Factors   just really hurting more                       [facility] Adult PT Treatment/Exercise - 09/08/19 0001      Ambulation/Gait   Gait Comments  FWW CGA x 100' 2x      Knee/Hip Exercises: Stretches   Passive Hamstring Stretch  Both;4 reps;20 seconds    Piriformis Stretch  Both;4 reps;20 seconds      Knee/Hip Exercises: Supine   Bridges with [employer]  10 reps    Bridges with [employer]  10 reps    Other Supine Knee/Hip Exercises  small crunches, rolling side to side    Other Supine Knee/Hip Exercises  feet on ball K2C, trunk rotation, small bridges, isometric abs               PT Short Term Goals - 03/20/19 1140  PT SHORT TERM GOAL #1   Title  independent with initial HEP    Status  Achieved        PT Long Term Goals - 08/28/19 1245      PT LONG TERM GOAL #4   Title  transfer independently with set up    Status  Partially Met            Plan - 09/08/19 1144    Clinical Impression Statement  Patient continues to have pain in other areas, the surgeon injected her shoulder last week, I tried to do more supine activities to ease the stress on her hip and back, she is tight in the piriformis and seems to have pain with this stretch.  She reports that she tried to walk a little at home this weekend and felt okay with this.    PT Next Visit Plan  pain is interfering with our progress and her ability to perform    Consulted and Agree with Plan of Care  Patient       Patient will benefit from skilled therapeutic intervention in order to improve the following deficits and impairments:  Abnormal gait, Decreased coordination, Decreased range of motion, Difficulty walking, Cardiopulmonary status limiting activity, Decreased endurance, Increased  muscle spasms, Impaired UE functional use, Decreased activity tolerance, Pain, Decreased balance, Postural dysfunction, Decreased strength, Decreased mobility  Visit Diagnosis: Difficulty in walking, not elsewhere classified  Late effects of CVA (cerebrovascular accident)  Muscle weakness (generalized)  Stiffness of left knee, not elsewhere classified  Pain in left ankle and joints of left foot  Acute pain of left knee  Hemiplegia and hemiparesis following cerebral infarction affecting left non-dominant side [facility])     Problem List Patient Active Problem List   Diagnosis Date Noted  . S/P total knee replacement 01/27/2019  . Recurrent left knee instability 07/05/2018  . Respiratory failure with hypoxia ([place]) 08/30/2017  . Hypoxemia   . Heart failure with preserved ejection fraction ([place]), Grade 3 diastolic dysfunction 16/08/930  . PAF (paroxysmal atrial fibrillation) ([place])   . Dyspnea 03/19/2017  . Encounter for preventive health examination 02/17/2016  . Sensorineural hearing loss (SNHL), bilateral 01/26/2016  . Morbid obesity ([place]) 06/17/2015  . Hypomagnesemia 04/24/2014  . Hemiparesis affecting left side as late effect of cerebrovascular accident ([place]) 04/24/2014  . Nontraumatic cerebral hemorrhage ([place]) 04/30/2012  . DM (diabetes mellitus) with complications ([place]) 35/57/3220  . OSTEOPENIA 01/21/2009  . UNSPECIFIED VITAMIN D DEFICIENCY 11/19/2007  . ESSENTIAL HYPERTENSION, BENIGN 11/19/2007  . HYPERCHOLESTEROLEMIA 10/25/2006  . GASTROESOPHAGEAL REFLUX, NO ESOPHAGITIS 10/25/2006  . DIVERTICULOSIS OF COLON 10/25/2006  . Osteoarthritis 10/25/2006  . CERVICAL SPINE DISORDER, NOS 10/25/2006    Sumner Boast., PT 09/08/2019, 12:01 PM  [place], [place], [address] Phone: [phone]   Fax:  [phone]  Name: Karen Jackson MRN: [id number] Date of Birth: 10-29-1940

## 2019-09-11 ENCOUNTER — Other Ambulatory Visit: Payer: Self-pay

## 2019-09-11 ENCOUNTER — Ambulatory Visit: Payer: Medicare Other | Admitting: Physical Therapy

## 2019-09-11 ENCOUNTER — Encounter: Payer: Self-pay | Admitting: Physical Therapy

## 2019-09-11 DIAGNOSIS — I6990 Unspecified sequelae of unspecified cerebrovascular disease: Secondary | ICD-10-CM

## 2019-09-11 DIAGNOSIS — R262 Difficulty in walking, not elsewhere classified: Secondary | ICD-10-CM

## 2019-09-11 DIAGNOSIS — M25572 Pain in left ankle and joints of left foot: Secondary | ICD-10-CM

## 2019-09-11 DIAGNOSIS — M6281 Muscle weakness (generalized): Secondary | ICD-10-CM

## 2019-09-11 DIAGNOSIS — M25662 Stiffness of left knee, not elsewhere classified: Secondary | ICD-10-CM

## 2019-09-11 DIAGNOSIS — M25562 Pain in left knee: Secondary | ICD-10-CM

## 2019-09-11 NOTE — Therapy (Signed)
[address] [place], [place], [address] Phone: [phone]   Fax:  [phone] Progress Note Reporting Period 12/3/21to 09/11/19 for visits 51-60  See note below for Objective Data and Assessment of Progress/Goals.      Physical Therapy Treatment  Patient Details  Name: Karen Jackson MRN: [id number] Date of Birth: 25-Dec-1940 Referring Provider (PT): Lucey   Encounter Date: 09/11/2019  PT End of Session - 09/11/19 1226    Visit Number  60    Date for PT Re-Evaluation  10/16/19    PT Start Time  1053    PT Stop Time  1138    PT Time Calculation (min)  45 min    Activity Tolerance  Patient limited by pain    Behavior During Therapy  [facility] for tasks assessed/performed       Past Medical History:  Diagnosis Date  . Acute cystitis without hematuria   . Acute diastolic CHF (congestive heart failure) ([place])   . Arthritis   . Dyspnea   . Dysrhythmia   . Fever of unknown origin 03/19/2017  . Hyperlipidemia   . Hypertension    denies at preop  . Multifocal pneumonia   . Neuromuscular disorder ([place])    neuropathy left arm and foot  . Osteopenia   . Paralysis ([place])    partial left side from CVA   . Persistent atrial fibrillation ([place])   . PONV (postoperative nausea and vomiting)   . Pre-diabetes   . Stroke [facility]) 2013   hemmorahgic    Past Surgical History:  Procedure Laterality Date  . ANKLE SURGERY    . APPENDECTOMY    . CHOLECYSTECTOMY    . HERNIA REPAIR     Esophagus  . JOINT REPLACEMENT     total- right partial- left  . MASTECTOMY PARTIAL / LUMPECTOMY  2012   left  . ORIF ANKLE FRACTURE Left 07/20/2018   Procedure: OPEN REDUCTION INTERNAL FIXATION (ORIF) ANKLE FRACTURE;  Surgeon: Wylene Simmer, MD;  Location: [facility];  Service: Orthopedics;  Laterality: Left;  . TOTAL KNEE ARTHROPLASTY Left 01/27/2019   Procedure: TOTAL KNEE ARTHROPLASTY;  Surgeon: Vickey Huger, MD;  [facility];   Service: Orthopedics;  Laterality: Left;    There were no vitals filed for this visit.  Subjective Assessment - 09/11/19 1125    Subjective  Patient reports that her shoulders, arms and hips are very sore    Currently in Pain?  Yes    Pain Score  7     Pain Location  Hip    Aggravating Factors   unsure why but is having incresaed soreness and tenderness in the shoulders, the upper t=arms and the hips                       [facility] Adult PT Treatment/Exercise - 09/11/19 0001      Ambulation/Gait   Gait Comments  FWW CGA x 100' 2x, did very good today, the biggest issue is her breathing      Knee/Hip Exercises: Aerobic   Nustep  L 5 6 min      Knee/Hip Exercises: Seated   Long Arc Quad  Strengthening;Both;2 sets;10 reps    Long Arc Quad Weight  5 lbs.      Manual Therapy   Manual Therapy  Soft tissue mobilization    Manual therapy comments  some STM for the low back due to an increase in  pain    Soft tissue mobilization  right buttock. low back and the let upper trap, right upper arm               PT Short Term Goals - 03/20/19 1140      PT SHORT TERM GOAL #1   Title  independent with initial HEP    Status  Achieved        PT Long Term Goals - 09/11/19 1307      PT LONG TERM GOAL #3   Title  walk 100 feet with SBA    Status  Partially Met            Plan - 09/11/19 1304    Clinical Impression Statement  Patient reporting more pain today, she reports pain in the hips the shoulders, the arms and the knee.  She is very tender and tight, I am not sure as she is not sure of what or why the increased pain, my only thought is there is a lot of compensation due to the stroke and the segnifcant arthritis that she has in her joints as well as the joint pain.  He gait after the STM was much better today, she is still limited by her breathing    PT Next Visit Plan  pain is interfering with our progress and her ability to perform       Patient will  benefit from skilled therapeutic intervention in order to improve the following deficits and impairments:  Abnormal gait, Decreased coordination, Decreased range of motion, Difficulty walking, Cardiopulmonary status limiting activity, Decreased endurance, Increased muscle spasms, Impaired UE functional use, Decreased activity tolerance, Pain, Decreased balance, Postural dysfunction, Decreased strength, Decreased mobility  Visit Diagnosis: Difficulty in walking, not elsewhere classified  Late effects of CVA (cerebrovascular accident)  Muscle weakness (generalized)  Stiffness of left knee, not elsewhere classified  Pain in left ankle and joints of left foot  Acute pain of left knee     Problem List Patient Active Problem List   Diagnosis Date Noted  . S/P total knee replacement 01/27/2019  . Recurrent left knee instability 07/05/2018  . Respiratory failure with hypoxia ([place]) 08/30/2017  . Hypoxemia   . Heart failure with preserved ejection fraction ([place]), Grade 3 diastolic dysfunction 35/46/5681  . PAF (paroxysmal atrial fibrillation) ([place])   . Dyspnea 03/19/2017  . Encounter for preventive health examination 02/17/2016  . Sensorineural hearing loss (SNHL), bilateral 01/26/2016  . Morbid obesity ([place]) 06/17/2015  . Hypomagnesemia 04/24/2014  . Hemiparesis affecting left side as late effect of cerebrovascular accident ([place]) 04/24/2014  . Nontraumatic cerebral hemorrhage ([place]) 04/30/2012  . DM (diabetes mellitus) with complications ([place]) 27/51/7001  . OSTEOPENIA 01/21/2009  . UNSPECIFIED VITAMIN D DEFICIENCY 11/19/2007  . ESSENTIAL HYPERTENSION, BENIGN 11/19/2007  . HYPERCHOLESTEROLEMIA 10/25/2006  . GASTROESOPHAGEAL REFLUX, NO ESOPHAGITIS 10/25/2006  . DIVERTICULOSIS OF COLON 10/25/2006  . Osteoarthritis 10/25/2006  . CERVICAL SPINE DISORDER, NOS 10/25/2006    Sumner Boast., PT 09/11/2019, 1:09 PM  [place] [address] [place], [place], [address] Phone: [phone]   Fax:  [phone]  Name: Karen Jackson MRN: [id number] Date of Birth: 1941-03-29

## 2019-09-12 ENCOUNTER — Other Ambulatory Visit: Payer: Self-pay | Admitting: Family Medicine

## 2019-09-15 ENCOUNTER — Encounter: Payer: Self-pay | Admitting: Physical Therapy

## 2019-09-15 ENCOUNTER — Other Ambulatory Visit: Payer: Self-pay

## 2019-09-15 ENCOUNTER — Ambulatory Visit: Payer: Medicare Other | Admitting: Physical Therapy

## 2019-09-15 DIAGNOSIS — M25572 Pain in left ankle and joints of left foot: Secondary | ICD-10-CM

## 2019-09-15 DIAGNOSIS — R262 Difficulty in walking, not elsewhere classified: Secondary | ICD-10-CM | POA: Diagnosis not present

## 2019-09-15 DIAGNOSIS — I6930 Unspecified sequelae of cerebral infarction: Secondary | ICD-10-CM

## 2019-09-15 DIAGNOSIS — M25662 Stiffness of left knee, not elsewhere classified: Secondary | ICD-10-CM

## 2019-09-15 DIAGNOSIS — M6281 Muscle weakness (generalized): Secondary | ICD-10-CM

## 2019-09-15 DIAGNOSIS — M25562 Pain in left knee: Secondary | ICD-10-CM

## 2019-09-15 DIAGNOSIS — I69354 Hemiplegia and hemiparesis following cerebral infarction affecting left non-dominant side: Secondary | ICD-10-CM

## 2019-09-15 DIAGNOSIS — I6990 Unspecified sequelae of unspecified cerebrovascular disease: Secondary | ICD-10-CM

## 2019-09-15 NOTE — Therapy (Signed)
[address] [place], [place], [address] Phone: [phone]   Fax:  [phone]  Physical Therapy Treatment  Patient Details  Name: Karen Jackson MRN: [id number] Date of Birth: 1941-08-26 Referring Provider (PT): Lucey   Encounter Date: 09/15/2019  PT End of Session - 09/15/19 1141    Visit Number  72    Date for PT Re-Evaluation  10/16/19    PT Start Time  1054    PT Stop Time  1140    PT Time Calculation (min)  46 min    Activity Tolerance  Patient limited by pain    Behavior During Therapy  [facility] for tasks assessed/performed       Past Medical History:  Diagnosis Date  . Acute cystitis without hematuria   . Acute diastolic CHF (congestive heart failure) ([place])   . Arthritis   . Dyspnea   . Dysrhythmia   . Fever of unknown origin 03/19/2017  . Hyperlipidemia   . Hypertension    denies at preop  . Multifocal pneumonia   . Neuromuscular disorder ([place])    neuropathy left arm and foot  . Osteopenia   . Paralysis ([place])    partial left side from CVA   . Persistent atrial fibrillation ([place])   . PONV (postoperative nausea and vomiting)   . Pre-diabetes   . Stroke [facility]) 2013   hemmorahgic    Past Surgical History:  Procedure Laterality Date  . ANKLE SURGERY    . APPENDECTOMY    . CHOLECYSTECTOMY    . HERNIA REPAIR     Esophagus  . JOINT REPLACEMENT     total- right partial- left  . MASTECTOMY PARTIAL / LUMPECTOMY  2012   left  . ORIF ANKLE FRACTURE Left 07/20/2018   Procedure: OPEN REDUCTION INTERNAL FIXATION (ORIF) ANKLE FRACTURE;  Surgeon: Wylene Simmer, MD;  Location: [place];  Service: Orthopedics;  Laterality: Left;  . TOTAL KNEE ARTHROPLASTY Left 01/27/2019   Procedure: TOTAL KNEE ARTHROPLASTY;  Surgeon: Vickey Huger, MD;  [facility];  Service: Orthopedics;  Laterality: Left;    There were no vitals filed for this visit.  Subjective Assessment - 09/15/19 1106    Subjective   Patient continues to have soreness, she reports that she did not hvae her normal help this weekend and had less activity and feels stiff    Currently in Pain?  Yes    Pain Score  5     Pain Location  Hip   shoulders and knee   Aggravating Factors   no moving enough                       [facility] Adult PT Treatment/Exercise - 09/15/19 0001      Ambulation/Gait   Gait Comments  FWW, SBA 100'x 3      Knee/Hip Exercises: Standing   Other Standing Knee Exercises  alternating feet on 4" step emphasizing control and the weight shift to the left., then did focus on just the right leg toe touches so she would get used to the weightbearing on the left, this was very difficult for her as she is afraid to bear weight on the left LE especially after the weekend of not doing much      Knee/Hip Exercises: Seated   Long Arc Quad  Left;3 sets;10 reps    Long Arc Quad Weight  5 lbs.    Other Seated Knee/Hip Exercises  seated ball bat backs working on core and sitting, she had pain in the right shoulder with this, seated partial sit ups    Other Seated Knee/Hip Exercises  left ankle red tband PF/DF, left hip adduction red tband, ankle on sit fit PF/DF      Manual Therapy   Manual Therapy  Soft tissue mobilization    Manual therapy comments  some STM for the low back due to an increase in pain    Soft tissue mobilization  right buttock. low back and the let upper trap, right upper arm    Passive ROM  left UE with approximation, left knee PROM               PT Short Term Goals - 03/20/19 1140      PT SHORT TERM GOAL #1   Title  independent with initial HEP    Status  Achieved        PT Long Term Goals - 09/11/19 1307      PT LONG TERM GOAL #3   Title  walk 100 feet with SBA    Status  Partially Met            Plan - 09/15/19 1141    Clinical Impression Statement  Patient having most issues with pain the past 3 weeks, pain in th ehips, the back, shoulders and arms.   She saw MD about a week ago and gave an injection but not much help with pain, she also has some issues with not getting up on the weekends which does seem to cause her to fatigue easily on the walking.  She does report that when she has her normal caregiver she will try to do more of getting up and walking with supervision but this is not often.    PT Next Visit Plan  pain is interfering with our progress and her ability to perform    Consulted and Agree with Plan of Care  Patient       Patient will benefit from skilled therapeutic intervention in order to improve the following deficits and impairments:  Abnormal gait, Decreased coordination, Decreased range of motion, Difficulty walking, Cardiopulmonary status limiting activity, Decreased endurance, Increased muscle spasms, Impaired UE functional use, Decreased activity tolerance, Pain, Decreased balance, Postural dysfunction, Decreased strength, Decreased mobility  Visit Diagnosis: Difficulty in walking, not elsewhere classified  Late effects of CVA (cerebrovascular accident)  Muscle weakness (generalized)  Stiffness of left knee, not elsewhere classified  Pain in left ankle and joints of left foot  Acute pain of left knee  Hemiplegia and hemiparesis following cerebral infarction affecting left non-dominant side [facility])     Problem List Patient Active Problem List   Diagnosis Date Noted  . S/P total knee replacement 01/27/2019  . Recurrent left knee instability 07/05/2018  . Respiratory failure with hypoxia ([place]) 08/30/2017  . Hypoxemia   . Heart failure with preserved ejection fraction ([place]), Grade 3 diastolic dysfunction 01/65/5374  . PAF (paroxysmal atrial fibrillation) ([place])   . Dyspnea 03/19/2017  . Encounter for preventive health examination 02/17/2016  . Sensorineural hearing loss (SNHL), bilateral 01/26/2016  . Morbid obesity ([place]) 06/17/2015  . Hypomagnesemia 04/24/2014  . Hemiparesis affecting left side as late  effect of cerebrovascular accident ([place]) 04/24/2014  . Nontraumatic cerebral hemorrhage ([facility]) 04/30/2012  . DM (diabetes mellitus) with complications ([place]) 82/70/7867  . OSTEOPENIA 01/21/2009  . UNSPECIFIED VITAMIN D DEFICIENCY 11/19/2007  . ESSENTIAL HYPERTENSION, BENIGN 11/19/2007  . HYPERCHOLESTEROLEMIA 10/25/2006  .  GASTROESOPHAGEAL REFLUX, NO ESOPHAGITIS 10/25/2006  . DIVERTICULOSIS OF COLON 10/25/2006  . Osteoarthritis 10/25/2006  . CERVICAL SPINE DISORDER, NOS 10/25/2006    Sumner Boast., PT 09/15/2019, 11:49 AM  [place] [address] [place], [place], [address] Phone: [phone]   Fax:  [phone]  Name: HAYLIN CAMILLI MRN: [id number] Date of Birth: 01/04/1941

## 2019-09-18 ENCOUNTER — Encounter: Payer: Self-pay | Admitting: Physical Therapy

## 2019-09-18 ENCOUNTER — Ambulatory Visit: Payer: Medicare Other | Admitting: Physical Therapy

## 2019-09-18 ENCOUNTER — Other Ambulatory Visit: Payer: Self-pay

## 2019-09-18 DIAGNOSIS — M25572 Pain in left ankle and joints of left foot: Secondary | ICD-10-CM

## 2019-09-18 DIAGNOSIS — I6990 Unspecified sequelae of unspecified cerebrovascular disease: Secondary | ICD-10-CM

## 2019-09-18 DIAGNOSIS — M6281 Muscle weakness (generalized): Secondary | ICD-10-CM

## 2019-09-18 DIAGNOSIS — I69354 Hemiplegia and hemiparesis following cerebral infarction affecting left non-dominant side: Secondary | ICD-10-CM

## 2019-09-18 DIAGNOSIS — R262 Difficulty in walking, not elsewhere classified: Secondary | ICD-10-CM

## 2019-09-18 DIAGNOSIS — M25562 Pain in left knee: Secondary | ICD-10-CM

## 2019-09-18 DIAGNOSIS — M25662 Stiffness of left knee, not elsewhere classified: Secondary | ICD-10-CM

## 2019-09-18 NOTE — Therapy (Signed)
[address] [place], [place], [address] Phone: [phone]   Fax:  [phone]  Physical Therapy Treatment  Patient Details  Name: Karen Jackson MRN: [id number] Date of Birth: 10-31-1940 Referring Provider (PT): Lucey   Encounter Date: 09/18/2019  PT End of Session - 09/18/19 1142    Visit Number  58    Date for PT Re-Evaluation  10/16/19    PT Start Time  1053    PT Stop Time  1141    PT Time Calculation (min)  48 min    Activity Tolerance  Patient tolerated treatment well    Behavior During Therapy  [facility] for tasks assessed/performed       Past Medical History:  Diagnosis Date  . Acute cystitis without hematuria   . Acute diastolic CHF (congestive heart failure) ([place])   . Arthritis   . Dyspnea   . Dysrhythmia   . Fever of unknown origin 03/19/2017  . Hyperlipidemia   . Hypertension    denies at preop  . Multifocal pneumonia   . Neuromuscular disorder ([place])    neuropathy left arm and foot  . Osteopenia   . Paralysis ([place])    partial left side from CVA   . Persistent atrial fibrillation ([place])   . PONV (postoperative nausea and vomiting)   . Pre-diabetes   . Stroke [facility]) 2013   hemmorahgic    Past Surgical History:  Procedure Laterality Date  . ANKLE SURGERY    . APPENDECTOMY    . CHOLECYSTECTOMY    . HERNIA REPAIR     Esophagus  . JOINT REPLACEMENT     total- right partial- left  . MASTECTOMY PARTIAL / LUMPECTOMY  2012   left  . ORIF ANKLE FRACTURE Left 07/20/2018   Procedure: OPEN REDUCTION INTERNAL FIXATION (ORIF) ANKLE FRACTURE;  Surgeon: Wylene Simmer, MD;  Location: [facility];  Service: Orthopedics;  Laterality: Left;  . TOTAL KNEE ARTHROPLASTY Left 01/27/2019   Procedure: TOTAL KNEE ARTHROPLASTY;  Surgeon: Vickey Huger, MD;  [facility];  Service: Orthopedics;  Laterality: Left;    There were no vitals filed for this visit.  Subjective Assessment - 09/18/19 1100    Subjective  I have less pain today, I am sore and tender.    Currently in Pain?  Yes    Pain Score  2     Pain Location  Shoulder         [facility] PT Assessment - 09/18/19 0001      Timed Up and Go Test   Normal TUG (seconds)  47                   [facility] Adult PT Treatment/Exercise - 09/18/19 0001      Ambulation/Gait   Gait Comments  FWW, SBA 100'x 3, some cues to keep going due to breathing issues, she did much better today with her transfers after me really reprimanding her for her poor transfers last visit      Knee/Hip Exercises: Stretches   Passive Hamstring Stretch  Both;4 reps;20 seconds    Quad Stretch  Left;3 reps;10 seconds      Knee/Hip Exercises: Aerobic   Nustep  L 5 7 min      Knee/Hip Exercises: Standing   Hip Abduction  Both;2 sets;10 reps    Hip Extension  Both;2 sets;10 reps    Other Standing Knee Exercises  alternating feet on 4" step emphasizing control and  the weight shift to the left., then did focus on just the right leg toe touches so she would get used to the weightbearing on the left, this was very difficult for her as she is afraid to bear weight on the left LE      Knee/Hip Exercises: Seated   Long Arc Quad  Left;3 sets;10 reps    Long Arc Quad Weight  5 lbs.    Other Seated Knee/Hip Exercises  left ankle red tband PF/DF, left hip adduction red tband, ankle on sit fit PF/DF    Hamstring Curl  Strengthening;Both;2 sets;10 reps    Hamstring Limitations  green tband               PT Short Term Goals - 03/20/19 1140      PT SHORT TERM GOAL #1   Title  independent with initial HEP    Status  Achieved        PT Long Term Goals - 09/18/19 1145      PT LONG TERM GOAL #3   Title  walk 100 feet with SBA    Status  Partially Met      PT LONG TERM GOAL #6   Title  decrease TUG time to 45 seconds    Status  Partially Met            Plan - 09/18/19 1142    Clinical Impression Statement  Patient did much better today  having less pain in the hips and the shoulders.  She did the best I have seen her do with the TUG.  She did very well with the transfers after I really reprimanded her aobut needing to really get turned and not short the transfer which puts stress on the knee and is more unsafe.  I asked her and her caregiver about the home situation and they feel she is doing very well    PT Next Visit Plan  will try to progress now if we can keep her pain and other health issues from having as much of an impact    Consulted and Agree with Plan of Care  Patient       Patient will benefit from skilled therapeutic intervention in order to improve the following deficits and impairments:  Abnormal gait, Decreased coordination, Decreased range of motion, Difficulty walking, Cardiopulmonary status limiting activity, Decreased endurance, Increased muscle spasms, Impaired UE functional use, Decreased activity tolerance, Pain, Decreased balance, Postural dysfunction, Decreased strength, Decreased mobility  Visit Diagnosis: Difficulty in walking, not elsewhere classified  Late effects of CVA (cerebrovascular accident)  Muscle weakness (generalized)  Stiffness of left knee, not elsewhere classified  Pain in left ankle and joints of left foot  Acute pain of left knee  Hemiplegia and hemiparesis following cerebral infarction affecting left non-dominant side [facility])     Problem List Patient Active Problem List   Diagnosis Date Noted  . S/P total knee replacement 01/27/2019  . Recurrent left knee instability 07/05/2018  . Respiratory failure with hypoxia ([place]) 08/30/2017  . Hypoxemia   . Heart failure with preserved ejection fraction ([place]), Grade 3 diastolic dysfunction 67/34/1937  . PAF (paroxysmal atrial fibrillation) ([place])   . Dyspnea 03/19/2017  . Encounter for preventive health examination 02/17/2016  . Sensorineural hearing loss (SNHL), bilateral 01/26/2016  . Morbid obesity ([place]) 06/17/2015  .  Hypomagnesemia 04/24/2014  . Hemiparesis affecting left side as late effect of cerebrovascular accident ([place]) 04/24/2014  . Nontraumatic cerebral hemorrhage ([place]) 04/30/2012  . DM (  diabetes mellitus) with complications ([place]) 33/61/2244  . OSTEOPENIA 01/21/2009  . UNSPECIFIED VITAMIN D DEFICIENCY 11/19/2007  . ESSENTIAL HYPERTENSION, BENIGN 11/19/2007  . HYPERCHOLESTEROLEMIA 10/25/2006  . GASTROESOPHAGEAL REFLUX, NO ESOPHAGITIS 10/25/2006  . DIVERTICULOSIS OF COLON 10/25/2006  . Osteoarthritis 10/25/2006  . CERVICAL SPINE DISORDER, NOS 10/25/2006    Sumner Boast., PT 09/18/2019, 11:46 AM  [address] [place], [place], [address] Phone: [phone]   Fax:  [phone]  Name: Karen Jackson MRN: [id number] Date of Birth: Feb 08, 1941

## 2019-09-19 ENCOUNTER — Ambulatory Visit: Payer: Medicare Other | Attending: Internal Medicine

## 2019-09-19 DIAGNOSIS — Z23 Encounter for immunization: Secondary | ICD-10-CM

## 2019-09-19 NOTE — Progress Notes (Signed)
   Covid-19 Vaccination Clinic  Name:  Karen Jackson    MRN: [id number] DOB: 1940-12-12  09/19/2019  [name] was observed post Covid-19 immunization for 15 minutes without incidence. She was provided with Vaccine Information Sheet and instruction to access the V-Safe system.   Ms. Longest was instructed to call 911 with any severe reactions post vaccine: Marland Kitchen Difficulty breathing  . Swelling of your face and throat  . A fast heartbeat  . A bad rash all over your body  . Dizziness and weakness    Immunizations Administered    Name Date Dose VIS Date Route   Pfizer COVID-19 Vaccine 09/19/2019 10:29 AM 0.3 mL 08/08/2019 Intramuscular   Manufacturer: [place]   Lot: [id number]   [place]: [id number]

## 2019-09-22 ENCOUNTER — Encounter: Payer: Self-pay | Admitting: Physical Therapy

## 2019-09-22 ENCOUNTER — Other Ambulatory Visit: Payer: Self-pay

## 2019-09-22 ENCOUNTER — Ambulatory Visit: Payer: Medicare Other | Admitting: Physical Therapy

## 2019-09-22 DIAGNOSIS — R262 Difficulty in walking, not elsewhere classified: Secondary | ICD-10-CM

## 2019-09-22 DIAGNOSIS — I69354 Hemiplegia and hemiparesis following cerebral infarction affecting left non-dominant side: Secondary | ICD-10-CM

## 2019-09-22 DIAGNOSIS — M25572 Pain in left ankle and joints of left foot: Secondary | ICD-10-CM

## 2019-09-22 DIAGNOSIS — I6990 Unspecified sequelae of unspecified cerebrovascular disease: Secondary | ICD-10-CM

## 2019-09-22 DIAGNOSIS — M25562 Pain in left knee: Secondary | ICD-10-CM

## 2019-09-22 DIAGNOSIS — M6281 Muscle weakness (generalized): Secondary | ICD-10-CM

## 2019-09-22 DIAGNOSIS — M25662 Stiffness of left knee, not elsewhere classified: Secondary | ICD-10-CM

## 2019-09-22 NOTE — Therapy (Signed)
[address] [place], [place], [address] Phone: [phone]   Fax:  [phone]  Physical Therapy Treatment  Patient Details  Name: Karen Jackson MRN: [id number] Date of Birth: [birth year] Referring Provider (PT): Lucey   Encounter Date: 09/22/2019  PT End of Session - 09/22/19 1201    Visit Number  44    Date for PT Re-Evaluation  10/16/19    PT Start Time  1057    PT Stop Time  1143    PT Time Calculation (min)  46 min    Activity Tolerance  Patient tolerated treatment well    Behavior During Therapy  [facility] for tasks assessed/performed       Past Medical History:  Diagnosis Date  . Acute cystitis without hematuria   . Acute diastolic CHF (congestive heart failure) ([place])   . Arthritis   . Dyspnea   . Dysrhythmia   . Fever of unknown origin 03/19/2017  . Hyperlipidemia   . Hypertension    denies at preop  . Multifocal pneumonia   . Neuromuscular disorder ([place])    neuropathy left arm and foot  . Osteopenia   . Paralysis ([place])    partial left side from CVA   . Persistent atrial fibrillation ([place])   . PONV (postoperative nausea and vomiting)   . Pre-diabetes   . Stroke [facility]) 2013   hemmorahgic    Past Surgical History:  Procedure Laterality Date  . ANKLE SURGERY    . APPENDECTOMY    . CHOLECYSTECTOMY    . HERNIA REPAIR     Esophagus  . JOINT REPLACEMENT     total- right partial- left  . MASTECTOMY PARTIAL / LUMPECTOMY  2012   left  . ORIF ANKLE FRACTURE Left 07/20/2018   Procedure: OPEN REDUCTION INTERNAL FIXATION (ORIF) ANKLE FRACTURE;  Surgeon: Wylene Simmer, MD;  Location: [facility];  Service: Orthopedics;  Laterality: Left;  . TOTAL KNEE ARTHROPLASTY Left 01/27/2019   Procedure: TOTAL KNEE ARTHROPLASTY;  Surgeon: Vickey Huger, MD;  [facility];  Service: Orthopedics;  Laterality: Left;    There were no vitals filed for this visit.  Subjective Assessment - 09/22/19 1151    Subjective  It is raining, I am stiff and sore all over    Currently in Pain?  Yes    Pain Location  Shoulder   knee                      [facility] Adult PT Treatment/Exercise - 09/22/19 0001      Transfers   Comments  worked on her transfers, again trying to get her to turn fully and "feel " the chair, she says "I can't see it" I tell her feel it with your legs, her way is to barely get there and fall into the chair      Ambulation/Gait   Gait Comments  in clinic to and from stations with FWW, SBA, then in hall 100'x 3, very fatigued and the shoulder started hurting more      Knee/Hip Exercises: Stretches   Quad Stretch  5 reps;20 seconds;Left      Knee/Hip Exercises: Aerobic   Nustep  L 5 7 min      Knee/Hip Exercises: Standing   Other Standing Knee Exercises  alternating feet on 4" step emphasizing control and the weight shift to the left., then did focus on just the right leg toe touches so she  would get used to the weightbearing on the left, this was very difficult for her as she is afraid to bear weight on the left LE      Knee/Hip Exercises: Seated   Long Arc Quad  Left;3 sets;10 reps    Long Arc Quad Weight  5 lbs.    Other Seated Knee/Hip Exercises  left ankle red tband PF/DF, left hip adduction red tband, ankle on sit fit PF/DF    Hamstring Curl  Strengthening;Both;2 sets;10 reps    Hamstring Limitations  green tband      Manual Therapy   Manual Therapy  Soft tissue mobilization    Soft tissue mobilization  left upper trap and neck area               PT Short Term Goals - 03/20/19 1140      PT SHORT TERM GOAL #1   Title  independent with initial HEP    Status  Achieved        PT Long Term Goals - 09/18/19 1145      PT LONG TERM GOAL #3   Title  walk 100 feet with SBA    Status  Partially Met      PT LONG TERM GOAL #6   Title  decrease TUG time to 45 seconds    Status  Partially Met            Plan - 09/22/19 1202    Clinical  Impression Statement  Patient did well today with the walking, she still is struggling with safe transfers, her limitation with walking is pain in the shoulder and shortness of breath, I am trying to slowly advance this as she tolerates but the multiple co morbidities does limit the progress,,, She was tight with the left knee flexion todeay    PT Next Visit Plan  will try to progress now if we can keep her pain and other health issues from having as much of an impact    Consulted and Agree with Plan of Care  Patient       Patient will benefit from skilled therapeutic intervention in order to improve the following deficits and impairments:  Abnormal gait, Decreased coordination, Decreased range of motion, Difficulty walking, Cardiopulmonary status limiting activity, Decreased endurance, Increased muscle spasms, Impaired UE functional use, Decreased activity tolerance, Pain, Decreased balance, Postural dysfunction, Decreased strength, Decreased mobility  Visit Diagnosis: Difficulty in walking, not elsewhere classified  Late effects of CVA (cerebrovascular accident)  Muscle weakness (generalized)  Stiffness of left knee, not elsewhere classified  Hemiplegia and hemiparesis following cerebral infarction affecting left non-dominant side (HCC)  Pain in left ankle and joints of left foot  Acute pain of left knee     Problem List Patient Active Problem List   Diagnosis Date Noted  . S/P total knee replacement 01/27/2019  . Recurrent left knee instability 07/05/2018  . Respiratory failure with hypoxia ([place]) 08/30/2017  . Hypoxemia   . Heart failure with preserved ejection fraction ([place]), Grade 3 diastolic dysfunction 42/68/3419  . PAF (paroxysmal atrial fibrillation) ([place])   . Dyspnea 03/19/2017  . Encounter for preventive health examination 02/17/2016  . Sensorineural hearing loss (SNHL), bilateral 01/26/2016  . Morbid obesity ([place]) 06/17/2015  . Hypomagnesemia 04/24/2014  .  Hemiparesis affecting left side as late effect of cerebrovascular accident ([place]) 04/24/2014  . Nontraumatic cerebral hemorrhage ([place]) 04/30/2012  . DM (diabetes mellitus) with complications ([place]) 62/22/9798  . OSTEOPENIA 01/21/2009  . UNSPECIFIED VITAMIN D  DEFICIENCY 11/19/2007  . ESSENTIAL HYPERTENSION, BENIGN 11/19/2007  . HYPERCHOLESTEROLEMIA 10/25/2006  . GASTROESOPHAGEAL REFLUX, NO ESOPHAGITIS 10/25/2006  . DIVERTICULOSIS OF COLON 10/25/2006  . Osteoarthritis 10/25/2006  . CERVICAL SPINE DISORDER, NOS 10/25/2006    Sumner Boast., PT 09/22/2019, 12:04 PM  [address] [place], [place], [address] Phone: [phone]   Fax:  [phone]  Name: Karen Jackson MRN: [id number] Date of Birth: 04-18-1941

## 2019-09-25 ENCOUNTER — Ambulatory Visit: Payer: Medicare Other | Admitting: Physical Therapy

## 2019-09-25 ENCOUNTER — Other Ambulatory Visit: Payer: Self-pay

## 2019-09-25 ENCOUNTER — Encounter: Payer: Self-pay | Admitting: Physical Therapy

## 2019-09-25 DIAGNOSIS — R262 Difficulty in walking, not elsewhere classified: Secondary | ICD-10-CM

## 2019-09-25 DIAGNOSIS — I69354 Hemiplegia and hemiparesis following cerebral infarction affecting left non-dominant side: Secondary | ICD-10-CM

## 2019-09-25 DIAGNOSIS — M25572 Pain in left ankle and joints of left foot: Secondary | ICD-10-CM

## 2019-09-25 DIAGNOSIS — M25662 Stiffness of left knee, not elsewhere classified: Secondary | ICD-10-CM

## 2019-09-25 DIAGNOSIS — I6990 Unspecified sequelae of unspecified cerebrovascular disease: Secondary | ICD-10-CM

## 2019-09-25 DIAGNOSIS — M6281 Muscle weakness (generalized): Secondary | ICD-10-CM

## 2019-09-25 NOTE — Therapy (Signed)
[address] [place], [place], [address] Phone: [phone]   Fax:  [phone]  Physical Therapy Treatment  Patient Details  Name: Karen Jackson MRN: [id number] Date of Birth: 1941/05/05 Referring Provider (PT): Lucey   Encounter Date: 09/25/2019  PT End of Session - 09/25/19 1144    Visit Number  53    Date for PT Re-Evaluation  10/16/19    PT Start Time  1053    PT Stop Time  1138    PT Time Calculation (min)  45 min    Activity Tolerance  Patient tolerated treatment well;Patient limited by fatigue    Behavior During Therapy  [facility] for tasks assessed/performed       Past Medical History:  Diagnosis Date  . Acute cystitis without hematuria   . Acute diastolic CHF (congestive heart failure) ([place])   . Arthritis   . Dyspnea   . Dysrhythmia   . Fever of unknown origin 03/19/2017  . Hyperlipidemia   . Hypertension    denies at preop  . Multifocal pneumonia   . Neuromuscular disorder ([place])    neuropathy left arm and foot  . Osteopenia   . Paralysis ([place])    partial left side from CVA   . Persistent atrial fibrillation ([place])   . PONV (postoperative nausea and vomiting)   . Pre-diabetes   . Stroke [facility]) 2013   hemmorahgic    Past Surgical History:  Procedure Laterality Date  . ANKLE SURGERY    . APPENDECTOMY    . CHOLECYSTECTOMY    . HERNIA REPAIR     Esophagus  . JOINT REPLACEMENT     total- right partial- left  . MASTECTOMY PARTIAL / LUMPECTOMY  2012   left  . ORIF ANKLE FRACTURE Left 07/20/2018   Procedure: OPEN REDUCTION INTERNAL FIXATION (ORIF) ANKLE FRACTURE;  Surgeon: Wylene Simmer, MD;  Location: [facility];  Service: Orthopedics;  Laterality: Left;  . TOTAL KNEE ARTHROPLASTY Left 01/27/2019   Procedure: TOTAL KNEE ARTHROPLASTY;  Surgeon: Vickey Huger, MD;  [facility];  Service: Orthopedics;  Laterality: Left;    There were no vitals filed for this visit.  Subjective Assessment -  09/25/19 1102    Subjective  No real issues, jsut cold and stiff, reports doing some walking at home with a consistent caregiver    Currently in Pain?  Yes    Pain Score  3     Pain Location  Shoulder    Pain Orientation  Left    Aggravating Factors   cold wet weather                       [facility] Adult PT Treatment/Exercise - 09/25/19 0001      Transfers   Comments  had two good transfers and two very poor transfers      Ambulation/Gait   Gait Comments  gait with FWW, 3 x 75, she was not able to do much due to shortness of breath.      Knee/Hip Exercises: Standing   Other Standing Knee Exercises  tried standing and alternating feet onto airex, she really would not shift weight on to the left and allow herself to pick up the right foot.  We then went to the right leg standing on the airex and the left on solid surface, making her bear weight on the left, did this a full minute x 3 , this required some  manual support of the left knee and some postural cues      Knee/Hip Exercises: Seated   Long Arc Quad  Left;3 sets;10 reps    Long Arc Quad Weight  5 lbs.    Long [employer] Limitations  cues for very slow and controlled to get better mm activity    Other Seated Knee/Hip Exercises  bolster behind her partial crunches    Other Seated Knee/Hip Exercises  left ankle red tband PF/DF, left hip adduction red tband, ankle on sit fit PF/DF    Hamstring Curl  Strengthening;Both;2 sets;10 reps    Hamstring Limitations  green tband               PT Short Term Goals - 03/20/19 1140      PT SHORT TERM GOAL #1   Title  independent with initial HEP    Status  Achieved        PT Long Term Goals - 09/25/19 1148      PT LONG TERM GOAL #3   Title  walk 100 feet with SBA    Status  Partially Met            Plan - 09/25/19 1144    Clinical Impression Statement  Patient really having difficulty shifting and allowing full weight bear on the left LE, she could not  raise the right leg up and put on top of airex due to this inability, she required some support of the left knee and some tactile and verbal cues to have good posture.  I have spoken with her about the need to do good transfers not only for safety but to avoid stress on the knee and ankle, she typically does poor because of bad habits and really trying to sit as fast as possible.    PT Next Visit Plan  will try to progress now if we can keep her pain and other health issues from having as much of an impact    Consulted and Agree with Plan of Care  Patient       Patient will benefit from skilled therapeutic intervention in order to improve the following deficits and impairments:  Abnormal gait, Decreased coordination, Decreased range of motion, Difficulty walking, Cardiopulmonary status limiting activity, Decreased endurance, Increased muscle spasms, Impaired UE functional use, Decreased activity tolerance, Pain, Decreased balance, Postural dysfunction, Decreased strength, Decreased mobility  Visit Diagnosis: Difficulty in walking, not elsewhere classified  Late effects of CVA (cerebrovascular accident)  Muscle weakness (generalized)  Stiffness of left knee, not elsewhere classified  Hemiplegia and hemiparesis following cerebral infarction affecting left non-dominant side (HCC)  Pain in left ankle and joints of left foot     Problem List Patient Active Problem List   Diagnosis Date Noted  . S/P total knee replacement 01/27/2019  . Recurrent left knee instability 07/05/2018  . Respiratory failure with hypoxia ([place]) 08/30/2017  . Hypoxemia   . Heart failure with preserved ejection fraction ([place]), Grade 3 diastolic dysfunction 77/41/2878  . PAF (paroxysmal atrial fibrillation) ([place])   . Dyspnea 03/19/2017  . Encounter for preventive health examination 02/17/2016  . Sensorineural hearing loss (SNHL), bilateral 01/26/2016  . Morbid obesity ([place]) 06/17/2015  . Hypomagnesemia 04/24/2014   . Hemiparesis affecting left side as late effect of cerebrovascular accident ([place]) 04/24/2014  . Nontraumatic cerebral hemorrhage ([place]) 04/30/2012  . DM (diabetes mellitus) with complications ([place]) 67/67/2094  . OSTEOPENIA 01/21/2009  . UNSPECIFIED VITAMIN D DEFICIENCY 11/19/2007  . ESSENTIAL HYPERTENSION, BENIGN  11/19/2007  . HYPERCHOLESTEROLEMIA 10/25/2006  . GASTROESOPHAGEAL REFLUX, NO ESOPHAGITIS 10/25/2006  . DIVERTICULOSIS OF COLON 10/25/2006  . Osteoarthritis 10/25/2006  . CERVICAL SPINE DISORDER, NOS 10/25/2006    Sumner Boast., PT 09/25/2019, 11:49 AM  [address] [place], [place], [address] Phone: [phone]   Fax:  [phone]  Name: NISHKA HEIDE MRN: [id number] Date of Birth: February 11, 1941

## 2019-09-30 ENCOUNTER — Ambulatory Visit: Payer: Medicare Other | Attending: Orthopedic Surgery | Admitting: Physical Therapy

## 2019-09-30 ENCOUNTER — Encounter: Payer: Self-pay | Admitting: Physical Therapy

## 2019-09-30 ENCOUNTER — Other Ambulatory Visit: Payer: Self-pay

## 2019-09-30 DIAGNOSIS — M25572 Pain in left ankle and joints of left foot: Secondary | ICD-10-CM | POA: Insufficient documentation

## 2019-09-30 DIAGNOSIS — I69354 Hemiplegia and hemiparesis following cerebral infarction affecting left non-dominant side: Secondary | ICD-10-CM | POA: Diagnosis present

## 2019-09-30 DIAGNOSIS — M6281 Muscle weakness (generalized): Secondary | ICD-10-CM | POA: Diagnosis present

## 2019-09-30 DIAGNOSIS — M25562 Pain in left knee: Secondary | ICD-10-CM | POA: Insufficient documentation

## 2019-09-30 DIAGNOSIS — I6990 Unspecified sequelae of unspecified cerebrovascular disease: Secondary | ICD-10-CM | POA: Diagnosis present

## 2019-09-30 DIAGNOSIS — R262 Difficulty in walking, not elsewhere classified: Secondary | ICD-10-CM | POA: Insufficient documentation

## 2019-09-30 DIAGNOSIS — M25662 Stiffness of left knee, not elsewhere classified: Secondary | ICD-10-CM | POA: Diagnosis present

## 2019-09-30 NOTE — Therapy (Signed)
[address] [place], [place], [address] Phone: [phone]   Fax:  [phone]  Physical Therapy Treatment  Patient Details  Name: Karen Jackson MRN: [id number] Date of Birth: 05-21-1941 Referring Provider (PT): Lucey   Encounter Date: 09/30/2019  PT End of Session - 09/30/19 1252    Visit Number  15    Date for PT Re-Evaluation  10/16/19    PT Start Time  1050    PT Stop Time  1138    PT Time Calculation (min)  48 min    Activity Tolerance  Patient tolerated treatment well    Behavior During Therapy  [facility] for tasks assessed/performed       Past Medical History:  Diagnosis Date  . Acute cystitis without hematuria   . Acute diastolic CHF (congestive heart failure) ([place])   . Arthritis   . Dyspnea   . Dysrhythmia   . Fever of unknown origin 03/19/2017  . Hyperlipidemia   . Hypertension    denies at preop  . Multifocal pneumonia   . Neuromuscular disorder ([place])    neuropathy left arm and foot  . Osteopenia   . Paralysis ([place])    partial left side from CVA   . Persistent atrial fibrillation ([place])   . PONV (postoperative nausea and vomiting)   . Pre-diabetes   . Stroke [facility]) 2013   hemmorahgic    Past Surgical History:  Procedure Laterality Date  . ANKLE SURGERY    . APPENDECTOMY    . CHOLECYSTECTOMY    . HERNIA REPAIR     Esophagus  . JOINT REPLACEMENT     total- right partial- left  . MASTECTOMY PARTIAL / LUMPECTOMY  2012   left  . ORIF ANKLE FRACTURE Left 07/20/2018   Procedure: OPEN REDUCTION INTERNAL FIXATION (ORIF) ANKLE FRACTURE;  Surgeon: Wylene Simmer, MD;  Location: [facility];  Service: Orthopedics;  Laterality: Left;  . TOTAL KNEE ARTHROPLASTY Left 01/27/2019   Procedure: TOTAL KNEE ARTHROPLASTY;  Surgeon: Vickey Huger, MD;  [facility];  Service: Orthopedics;  Laterality: Left;    There were no vitals filed for this visit.  Subjective Assessment - 09/30/19 1058    Subjective   Patient reports that she remains stiff, tried some walking at home over the weekend    Currently in Pain?  Yes    Pain Score  5     Pain Location  Shoulder    Pain Orientation  Left    Pain Descriptors / Indicators  Sore                       [facility] Adult PT Treatment/Exercise - 09/30/19 0001      Transfers   Comments  again the first transfer from thje car to the chair is very unsafe, she will not get turned around, tends to get halfway and then sits and is usually half on the chair, this improves at the end of the treatment, the current caregive that is normally there with her during the day does a good job, from what the patient tells me there are others that tend to really sling her into the chair and this occurs more on the weekends      Ambulation/Gait   Gait Comments  FWW, the walker she has appears to be bent today, on leg was not touching the ground, she is unsure of why, She thinks it is 80 years old,  Iasked her to call about a new one to be safe, 125' x 2 and 75' x 1       Knee/Hip Exercises: Standing   Other Standing Knee Exercises  tried standing and alternating feet onto airex, she really would not shift weight on to the left and allow herself to pick up the right foot.  We then went to the right leg standing on the airex and the left on solid surface, making her bear weight on the left, did this a full minute x 3 , this required some manual support of the left knee and some postural cues      Knee/Hip Exercises: Seated   Long Arc Quad  Left;3 sets;10 reps    Long Arc Quad Weight  5 lbs.    Other Seated Knee/Hip Exercises  left ankle red tband PF/DF, left hip adduction red tband, ankle on sit fit PF/DF    Marching  Both;2 sets;10 reps    Marching Weights  5 lbs.      Manual Therapy   Manual Therapy  Soft tissue mobilization    Soft tissue mobilization  left upper trap and neck area               PT Short Term Goals - 03/20/19 1140      PT SHORT  TERM GOAL #1   Title  independent with initial HEP    Status  Achieved        PT Long Term Goals - 09/25/19 1148      PT LONG TERM GOAL #3   Title  walk 100 feet with SBA    Status  Partially Met            Plan - 09/30/19 1253    Clinical Impression Statement  Patient did better with the weight bearing on the left today.  She continues to do very poorly with her first transfer.  Very unsafe.  She is having a little more shortness of breath recently and also having left shoulder pain.  There was a lot more crepitus.  I have continued to instruct her in  better transfers but she really struggles.    PT Next Visit Plan  will try to progress now if we can keep her pain and other health issues from having as much of an impact    Consulted and Agree with Plan of Care  Patient       Patient will benefit from skilled therapeutic intervention in order to improve the following deficits and impairments:  Abnormal gait, Decreased coordination, Decreased range of motion, Difficulty walking, Cardiopulmonary status limiting activity, Decreased endurance, Increased muscle spasms, Impaired UE functional use, Decreased activity tolerance, Pain, Decreased balance, Postural dysfunction, Decreased strength, Decreased mobility  Visit Diagnosis: Difficulty in walking, not elsewhere classified  Late effects of CVA (cerebrovascular accident)  Muscle weakness (generalized)  Stiffness of left knee, not elsewhere classified  Hemiplegia and hemiparesis following cerebral infarction affecting left non-dominant side (HCC)  Pain in left ankle and joints of left foot  Acute pain of left knee     Problem List Patient Active Problem List   Diagnosis Date Noted  . S/P total knee replacement 01/27/2019  . Recurrent left knee instability 07/05/2018  . Respiratory failure with hypoxia ([place]) 08/30/2017  . Hypoxemia   . Heart failure with preserved ejection fraction ([place]), Grade 3 diastolic dysfunction  91/47/8295  . PAF (paroxysmal atrial fibrillation) ([place])   . Dyspnea 03/19/2017  . Encounter for  preventive health examination 02/17/2016  . Sensorineural hearing loss (SNHL), bilateral 01/26/2016  . Morbid obesity ([place]) 06/17/2015  . Hypomagnesemia 04/24/2014  . Hemiparesis affecting left side as late effect of cerebrovascular accident ([place]) 04/24/2014  . Nontraumatic cerebral hemorrhage ([place]) 04/30/2012  . DM (diabetes mellitus) with complications ([place]) 43/83/8184  . OSTEOPENIA 01/21/2009  . UNSPECIFIED VITAMIN D DEFICIENCY 11/19/2007  . ESSENTIAL HYPERTENSION, BENIGN 11/19/2007  . HYPERCHOLESTEROLEMIA 10/25/2006  . GASTROESOPHAGEAL REFLUX, NO ESOPHAGITIS 10/25/2006  . DIVERTICULOSIS OF COLON 10/25/2006  . Osteoarthritis 10/25/2006  . CERVICAL SPINE DISORDER, NOS 10/25/2006    Sumner Boast., PT 09/30/2019, 12:58 PM  [address] [place], [place], [address] Phone: [phone]   Fax:  [phone]  Name: Karen Jackson MRN: [id number] Date of Birth: [birth year]

## 2019-10-03 ENCOUNTER — Ambulatory Visit: Payer: Medicare Other | Admitting: Physical Therapy

## 2019-10-03 ENCOUNTER — Encounter: Payer: Self-pay | Admitting: Physical Therapy

## 2019-10-03 ENCOUNTER — Other Ambulatory Visit: Payer: Self-pay

## 2019-10-03 DIAGNOSIS — M25562 Pain in left knee: Secondary | ICD-10-CM

## 2019-10-03 DIAGNOSIS — I6990 Unspecified sequelae of unspecified cerebrovascular disease: Secondary | ICD-10-CM

## 2019-10-03 DIAGNOSIS — R262 Difficulty in walking, not elsewhere classified: Secondary | ICD-10-CM

## 2019-10-03 DIAGNOSIS — M25572 Pain in left ankle and joints of left foot: Secondary | ICD-10-CM

## 2019-10-03 DIAGNOSIS — M6281 Muscle weakness (generalized): Secondary | ICD-10-CM

## 2019-10-03 DIAGNOSIS — M25662 Stiffness of left knee, not elsewhere classified: Secondary | ICD-10-CM

## 2019-10-03 DIAGNOSIS — I69354 Hemiplegia and hemiparesis following cerebral infarction affecting left non-dominant side: Secondary | ICD-10-CM

## 2019-10-03 NOTE — Therapy (Signed)
[place] [address] [place], [place], [address] Phone: [phone]   Fax:  [phone]  Physical Therapy Treatment  Patient Details  Name: Karen Jackson MRN: [id number] Date of Birth: 04/06/1941 Referring Provider (PT): Lucey   Encounter Date: 10/03/2019  PT End of Session - 10/03/19 1206    Visit Number  48    Date for PT Re-Evaluation  10/16/19    PT Start Time  1011    PT Stop Time  1056    PT Time Calculation (min)  45 min    Activity Tolerance  Patient tolerated treatment well    Behavior During Therapy  [facility] for tasks assessed/performed       Past Medical History:  Diagnosis Date  . Acute cystitis without hematuria   . Acute diastolic CHF (congestive heart failure) ([place])   . Arthritis   . Dyspnea   . Dysrhythmia   . Fever of unknown origin 03/19/2017  . Hyperlipidemia   . Hypertension    denies at preop  . Multifocal pneumonia   . Neuromuscular disorder ([place])    neuropathy left arm and foot  . Osteopenia   . Paralysis ([place])    partial left side from CVA   . Persistent atrial fibrillation ([place])   . PONV (postoperative nausea and vomiting)   . Pre-diabetes   . Stroke [facility]) 2013   hemmorahgic    Past Surgical History:  Procedure Laterality Date  . ANKLE SURGERY    . APPENDECTOMY    . CHOLECYSTECTOMY    . HERNIA REPAIR     Esophagus  . JOINT REPLACEMENT     total- right partial- left  . MASTECTOMY PARTIAL / LUMPECTOMY  2012   left  . ORIF ANKLE FRACTURE Left 07/20/2018   Procedure: OPEN REDUCTION INTERNAL FIXATION (ORIF) ANKLE FRACTURE;  Surgeon: Wylene Simmer, MD;  Location: [facility];  Service: Orthopedics;  Laterality: Left;  . TOTAL KNEE ARTHROPLASTY Left 01/27/2019   Procedure: TOTAL KNEE ARTHROPLASTY;  Surgeon: Vickey Huger, MD;  [facility];  Service: Orthopedics;  Laterality: Left;    There were no vitals filed for this visit.  Subjective Assessment - 10/03/19 1025    Subjective   Reports that she is trying to transfer better, she does report stiff and sore today    Currently in Pain?  Yes    Pain Location  Shoulder    Pain Orientation  Left                       [facility] Adult PT Treatment/Exercise - 10/03/19 0001      Ambulation/Gait   Gait Comments  she got a new walker, I was asked to set it up with the skis and we walked with it,m needed  couple of adjustments      Knee/Hip Exercises: Aerobic   Nustep  L 5 7 min      Knee/Hip Exercises: Machines for Strengthening   Cybex Knee Extension  5# 3x10    Cybex Knee Flexion  20#3x10      Knee/Hip Exercises: Supine   Bridges  Both;2 sets;10 reps    Other Supine Knee/Hip Exercises  small crunches, rolling side to side    Other Supine Knee/Hip Exercises  feet on ball K2C, trunk rotation, small bridges, isometric abs               PT Short Term Goals - 03/20/19 1140  PT SHORT TERM GOAL #1   Title  independent with initial HEP    Status  Achieved        PT Long Term Goals - 10/03/19 1208      PT LONG TERM GOAL #1   Title  walk with hand hold assist x 100 feet    Status  Achieved      PT LONG TERM GOAL #4   Title  transfer independently with set up    Status  Partially Met            Plan - 10/03/19 1206    Clinical Impression Statement  Patient still having difficulty with the transfers, cannot get her to do the first transfer well, she halfway gets there and is not in the chair.  She did really well with the bed mobility    PT Next Visit Plan  work on her transfers    Consulted and Agree with Plan of Care  Patient       Patient will benefit from skilled therapeutic intervention in order to improve the following deficits and impairments:  Abnormal gait, Decreased coordination, Decreased range of motion, Difficulty walking, Cardiopulmonary status limiting activity, Decreased endurance, Increased muscle spasms, Impaired UE functional use, Decreased activity tolerance,  Pain, Decreased balance, Postural dysfunction, Decreased strength, Decreased mobility  Visit Diagnosis: Difficulty in walking, not elsewhere classified  Late effects of CVA (cerebrovascular accident)  Muscle weakness (generalized)  Stiffness of left knee, not elsewhere classified  Hemiplegia and hemiparesis following cerebral infarction affecting left non-dominant side (HCC)  Pain in left ankle and joints of left foot  Acute pain of left knee     Problem List Patient Active Problem List   Diagnosis Date Noted  . S/P total knee replacement 01/27/2019  . Recurrent left knee instability 07/05/2018  . Respiratory failure with hypoxia ([place]) 08/30/2017  . Hypoxemia   . Heart failure with preserved ejection fraction ([place]), Grade 3 diastolic dysfunction 20/25/4270  . PAF (paroxysmal atrial fibrillation) ([place])   . Dyspnea 03/19/2017  . Encounter for preventive health examination 02/17/2016  . Sensorineural hearing loss (SNHL), bilateral 01/26/2016  . Morbid obesity ([place]) 06/17/2015  . Hypomagnesemia 04/24/2014  . Hemiparesis affecting left side as late effect of cerebrovascular accident ([place]) 04/24/2014  . Nontraumatic cerebral hemorrhage ([place]) 04/30/2012  . DM (diabetes mellitus) with complications ([place]) 62/37/6283  . OSTEOPENIA 01/21/2009  . UNSPECIFIED VITAMIN D DEFICIENCY 11/19/2007  . ESSENTIAL HYPERTENSION, BENIGN 11/19/2007  . HYPERCHOLESTEROLEMIA 10/25/2006  . GASTROESOPHAGEAL REFLUX, NO ESOPHAGITIS 10/25/2006  . DIVERTICULOSIS OF COLON 10/25/2006  . Osteoarthritis 10/25/2006  . CERVICAL SPINE DISORDER, NOS 10/25/2006    Karen Boast., PT 10/03/2019, 12:09 PM  [facility] [address] [place], [place], [address] Phone: [phone]   Fax:  [phone]  Name: Karen Jackson MRN: [id number] Date of Birth: 18-May-1941

## 2019-10-06 ENCOUNTER — Ambulatory Visit: Payer: Medicare Other | Admitting: Physical Therapy

## 2019-10-06 ENCOUNTER — Other Ambulatory Visit: Payer: Self-pay

## 2019-10-06 ENCOUNTER — Encounter: Payer: Self-pay | Admitting: Physical Therapy

## 2019-10-06 DIAGNOSIS — M25572 Pain in left ankle and joints of left foot: Secondary | ICD-10-CM

## 2019-10-06 DIAGNOSIS — R262 Difficulty in walking, not elsewhere classified: Secondary | ICD-10-CM | POA: Diagnosis not present

## 2019-10-06 DIAGNOSIS — I69354 Hemiplegia and hemiparesis following cerebral infarction affecting left non-dominant side: Secondary | ICD-10-CM

## 2019-10-06 DIAGNOSIS — M6281 Muscle weakness (generalized): Secondary | ICD-10-CM

## 2019-10-06 DIAGNOSIS — M25662 Stiffness of left knee, not elsewhere classified: Secondary | ICD-10-CM

## 2019-10-06 DIAGNOSIS — I6990 Unspecified sequelae of unspecified cerebrovascular disease: Secondary | ICD-10-CM

## 2019-10-06 DIAGNOSIS — M25562 Pain in left knee: Secondary | ICD-10-CM

## 2019-10-06 NOTE — Therapy (Signed)
[address] [place], [place], [address] Phone: [phone]   Fax:  [phone]  Physical Therapy Treatment  Patient Details  Name: Karen Jackson MRN: [id number] Date of Birth: [birth year] Referring Provider (PT): Lucey   Encounter Date: 10/06/2019  PT End of Session - 10/06/19 1055    Visit Number  21    Date for PT Re-Evaluation  10/16/19    PT Start Time  1219    PT Stop Time  1055    PT Time Calculation (min)  40 min    Activity Tolerance  Patient limited by fatigue    Behavior During Therapy  [facility] for tasks assessed/performed       Past Medical History:  Diagnosis Date  . Acute cystitis without hematuria   . Acute diastolic CHF (congestive heart failure) ([place])   . Arthritis   . Dyspnea   . Dysrhythmia   . Fever of unknown origin 03/19/2017  . Hyperlipidemia   . Hypertension    denies at preop  . Multifocal pneumonia   . Neuromuscular disorder ([place])    neuropathy left arm and foot  . Osteopenia   . Paralysis ([place])    partial left side from CVA   . Persistent atrial fibrillation ([place])   . PONV (postoperative nausea and vomiting)   . Pre-diabetes   . Stroke [facility]) 2013   hemmorahgic    Past Surgical History:  Procedure Laterality Date  . ANKLE SURGERY    . APPENDECTOMY    . CHOLECYSTECTOMY    . HERNIA REPAIR     Esophagus  . JOINT REPLACEMENT     total- right partial- left  . MASTECTOMY PARTIAL / LUMPECTOMY  2012   left  . ORIF ANKLE FRACTURE Left 07/20/2018   Procedure: OPEN REDUCTION INTERNAL FIXATION (ORIF) ANKLE FRACTURE;  Surgeon: Wylene Simmer, MD;  Location: [facility];  Service: Orthopedics;  Laterality: Left;  . TOTAL KNEE ARTHROPLASTY Left 01/27/2019   Procedure: TOTAL KNEE ARTHROPLASTY;  Surgeon: Vickey Huger, MD;  [facility];  Service: Orthopedics;  Laterality: Left;    There were no vitals filed for this visit.  Subjective Assessment - 10/06/19 1017    Subjective   Patient reports that her arm, shoulder and knee hurt today, ankle is hurting as well.    Currently in Pain?  Yes    Pain Score  6     Pain Location  Shoulder    Pain Orientation  Left    Pain Descriptors / Indicators  Aching;Sore         [facility] PT Assessment - 10/06/19 0001      Timed Up and Go Test   Normal TUG (seconds)  45                   [facility] Adult PT Treatment/Exercise - 10/06/19 0001      Transfers   Comments  really practiced this as this is a hang up for her, she has a very difficult time getting turned around before sitting, making it very unsafe.  she reports that with the maxk on she cannot see the chair I am telling her she needs to feel it on her legs before sitting      Ambulation/Gait   Gait Comments  walking with the new walker, reports better over her stone floor in the hall of her home, she needs CGA/SBA 3x75 feet, patient with increased shortness of breath  Knee/Hip Exercises: Aerobic   Nustep  L 5 7 min      Knee/Hip Exercises: Seated   Long Arc Quad  Left;3 sets;10 reps    Long Arc Quad Weight  5 lbs.    Marching  Both;2 sets;10 reps    Marching Weights  5 lbs.    Hamstring Curl  Strengthening;Both;2 sets;10 reps    Hamstring Limitations  blue tband      Knee/Hip Exercises: Supine   Bridges  Both;2 sets;10 reps    Other Supine Knee/Hip Exercises  small crunches, rolling side to side               PT Short Term Goals - 03/20/19 1140      PT SHORT TERM GOAL #1   Title  independent with initial HEP    Status  Achieved        PT Long Term Goals - 10/03/19 1208      PT LONG TERM GOAL #1   Title  walk with hand hold assist x 100 feet    Status  Achieved      PT LONG TERM GOAL #4   Title  transfer independently with set up    Status  Partially Met            Plan - 10/06/19 1056    Clinical Impression Statement  Patient continues to be unsafe with her transfers, sitting before it is safe, she reports that she  usually takes two lasix but did not due to not knowing her caregiver yesterday and not wanting to have to go to the bathroom more, she is more congested today and was very short of breath with the walking, we had to decrese the distance due to this    PT Next Visit Plan  may start to work with caregiver on HEP and independent gym    Consulted and Agree with Plan of Care  Patient       Patient will benefit from skilled therapeutic intervention in order to improve the following deficits and impairments:  Abnormal gait, Decreased coordination, Decreased range of motion, Difficulty walking, Cardiopulmonary status limiting activity, Decreased endurance, Increased muscle spasms, Impaired UE functional use, Decreased activity tolerance, Pain, Decreased balance, Postural dysfunction, Decreased strength, Decreased mobility  Visit Diagnosis: Difficulty in walking, not elsewhere classified  Late effects of CVA (cerebrovascular accident)  Muscle weakness (generalized)  Stiffness of left knee, not elsewhere classified  Hemiplegia and hemiparesis following cerebral infarction affecting left non-dominant side (HCC)  Pain in left ankle and joints of left foot  Acute pain of left knee     Problem List Patient Active Problem List   Diagnosis Date Noted  . S/P total knee replacement 01/27/2019  . Recurrent left knee instability 07/05/2018  . Respiratory failure with hypoxia ([place]) 08/30/2017  . Hypoxemia   . Heart failure with preserved ejection fraction ([place]), Grade 3 diastolic dysfunction 73/71/0626  . PAF (paroxysmal atrial fibrillation) ([place])   . Dyspnea 03/19/2017  . Encounter for preventive health examination 02/17/2016  . Sensorineural hearing loss (SNHL), bilateral 01/26/2016  . Morbid obesity ([place]) 06/17/2015  . Hypomagnesemia 04/24/2014  . Hemiparesis affecting left side as late effect of cerebrovascular accident ([place]) 04/24/2014  . Nontraumatic cerebral hemorrhage ([place]) 04/30/2012  .  DM (diabetes mellitus) with complications ([place]) 94/85/4627  . OSTEOPENIA 01/21/2009  . UNSPECIFIED VITAMIN D DEFICIENCY 11/19/2007  . ESSENTIAL HYPERTENSION, BENIGN 11/19/2007  . HYPERCHOLESTEROLEMIA 10/25/2006  . GASTROESOPHAGEAL REFLUX, NO ESOPHAGITIS 10/25/2006  .  DIVERTICULOSIS OF COLON 10/25/2006  . Osteoarthritis 10/25/2006  . CERVICAL SPINE DISORDER, NOS 10/25/2006    Sumner Boast., PT 10/06/2019, 10:58 AM  [place] [address] [place], [place], [address] Phone: [phone]   Fax:  [phone]  Name: Karen Jackson MRN: [id number] Date of Birth: 03-22-1941

## 2019-10-07 ENCOUNTER — Telehealth: Payer: Self-pay

## 2019-10-07 NOTE — Telephone Encounter (Signed)
Called and gave verbal order.  They will be faxing an order for me to sign.  I provided the needed information.

## 2019-10-07 NOTE — Telephone Encounter (Signed)
Lavella Lemons with [employer] calls nurse line requesting verbal order for PureWick catheter that patient has been requesting. Verbal order will allow patient to receive supply while we work on [employer] paperwork.   Company will be faxing over necessary medicare forms as well to get the process started for insurance to cover supply.  Orders can be called in to [phone]  To PCP  Talbot Grumbling, RN

## 2019-10-09 ENCOUNTER — Other Ambulatory Visit: Payer: Self-pay

## 2019-10-09 ENCOUNTER — Encounter: Payer: Self-pay | Admitting: Physical Therapy

## 2019-10-09 ENCOUNTER — Ambulatory Visit: Payer: Medicare Other | Admitting: Physical Therapy

## 2019-10-09 DIAGNOSIS — M25662 Stiffness of left knee, not elsewhere classified: Secondary | ICD-10-CM

## 2019-10-09 DIAGNOSIS — I69354 Hemiplegia and hemiparesis following cerebral infarction affecting left non-dominant side: Secondary | ICD-10-CM

## 2019-10-09 DIAGNOSIS — I6990 Unspecified sequelae of unspecified cerebrovascular disease: Secondary | ICD-10-CM

## 2019-10-09 DIAGNOSIS — R262 Difficulty in walking, not elsewhere classified: Secondary | ICD-10-CM

## 2019-10-09 DIAGNOSIS — M6281 Muscle weakness (generalized): Secondary | ICD-10-CM

## 2019-10-09 DIAGNOSIS — M25572 Pain in left ankle and joints of left foot: Secondary | ICD-10-CM

## 2019-10-09 DIAGNOSIS — M25562 Pain in left knee: Secondary | ICD-10-CM

## 2019-10-09 NOTE — Therapy (Signed)
[address] [place], [place], [address] Phone: [phone]   Fax:  [phone]  Physical Therapy Treatment  Patient Details  Name: Karen Jackson MRN: [id number] Date of Birth: 12/01/1940 Referring Provider (PT): Lucey   Encounter Date: 10/09/2019  PT End of Session - 10/09/19 1230    Visit Number  63    Date for PT Re-Evaluation  10/16/19    PT Start Time  1055    PT Stop Time  1137    PT Time Calculation (min)  42 min    Activity Tolerance  Patient limited by fatigue    Behavior During Therapy  [facility] for tasks assessed/performed       Past Medical History:  Diagnosis Date  . Acute cystitis without hematuria   . Acute diastolic CHF (congestive heart failure) ([place])   . Arthritis   . Dyspnea   . Dysrhythmia   . Fever of unknown origin 03/19/2017  . Hyperlipidemia   . Hypertension    denies at preop  . Multifocal pneumonia   . Neuromuscular disorder ([place])    neuropathy left arm and foot  . Osteopenia   . Paralysis ([place])    partial left side from CVA   . Persistent atrial fibrillation ([place])   . PONV (postoperative nausea and vomiting)   . Pre-diabetes   . Stroke [facility]) 2013   hemmorahgic    Past Surgical History:  Procedure Laterality Date  . ANKLE SURGERY    . APPENDECTOMY    . CHOLECYSTECTOMY    . HERNIA REPAIR     Esophagus  . JOINT REPLACEMENT     total- right partial- left  . MASTECTOMY PARTIAL / LUMPECTOMY  2012   left  . ORIF ANKLE FRACTURE Left 07/20/2018   Procedure: OPEN REDUCTION INTERNAL FIXATION (ORIF) ANKLE FRACTURE;  Surgeon: Wylene Simmer, MD;  Location: [facility];  Service: Orthopedics;  Laterality: Left;  . TOTAL KNEE ARTHROPLASTY Left 01/27/2019   Procedure: TOTAL KNEE ARTHROPLASTY;  Surgeon: Vickey Huger, MD;  [facility];  Service: Orthopedics;  Laterality: Left;    There were no vitals filed for this visit.  Subjective Assessment - 10/09/19 1222    Subjective   Patient continues to work on her transferss but yet still has difficulty with the transfer safely    Currently in Pain?  Yes    Pain Score  4     Pain Location  Shoulder    Pain Orientation  Left    Aggravating Factors   the wet weather                       [facility] Adult PT Treatment/Exercise - 10/09/19 0001      Transfers   Comments  tried transferrs again and her first transfer is not good, still unsafe, the 2nd and 3rd are much improved but still requires the cues      Ambulation/Gait   Gait Comments  tried one long walk able to do 135 feet due to shortness of breath      Self-Care   Self-Care  Other Self-Care Comments    Other Self-Care Comments   started instruction of her caregiver on some of our equipment and how to use.  Safety adjustments etc... looking to have her and care giver do on own gym program in the near future when we D/C      Knee/Hip Exercises: Aerobic   Nustep  L 5 7 min      Knee/Hip Exercises: Standing   Other Standing Knee Exercises  toe touches onto 4" step lateral and in front alternatine 30 total      Knee/Hip Exercises: Seated   Long Arc Quad  Left;3 sets;10 reps    Long Arc Quad Weight  5 lbs.    Other Seated Knee/Hip Exercises  bolster behind her partial crunches      Manual Therapy   Manual Therapy  Soft tissue mobilization    Soft tissue mobilization  left upper trap and neck area    Passive ROM  left knee for flexion               PT Short Term Goals - 03/20/19 1140      PT SHORT TERM GOAL #1   Title  independent with initial HEP    Status  Achieved        PT Long Term Goals - 10/09/19 1233      PT LONG TERM GOAL #3   Title  walk 100 feet with SBA    Status  Achieved            Plan - 10/09/19 1231    Clinical Impression Statement  Continues to struggle with safe transfers, I startd the education process iwht her caregiver on the equipment, safety, use, adjuctments and cues as the plan would be for  them to do independent gym activities when we D/C    PT Next Visit Plan  conitnue to work on the transfers and the HEP    Consulted and Agree with Plan of Care  Patient       Patient will benefit from skilled therapeutic intervention in order to improve the following deficits and impairments:  Abnormal gait, Decreased coordination, Decreased range of motion, Difficulty walking, Cardiopulmonary status limiting activity, Decreased endurance, Increased muscle spasms, Impaired UE functional use, Decreased activity tolerance, Pain, Decreased balance, Postural dysfunction, Decreased strength, Decreased mobility  Visit Diagnosis: Difficulty in walking, not elsewhere classified  Late effects of CVA (cerebrovascular accident)  Muscle weakness (generalized)  Stiffness of left knee, not elsewhere classified  Hemiplegia and hemiparesis following cerebral infarction affecting left non-dominant side (HCC)  Pain in left ankle and joints of left foot  Acute pain of left knee     Problem List Patient Active Problem List   Diagnosis Date Noted  . S/P total knee replacement 01/27/2019  . Recurrent left knee instability 07/05/2018  . Respiratory failure with hypoxia ([place]) 08/30/2017  . Hypoxemia   . Heart failure with preserved ejection fraction ([place]), Grade 3 diastolic dysfunction [id number]  . PAF (paroxysmal atrial fibrillation) ([place])   . Dyspnea 03/19/2017  . Encounter for preventive health examination 02/17/2016  . Sensorineural hearing loss (SNHL), bilateral 01/26/2016  . Morbid obesity ([place]) 06/17/2015  . Hypomagnesemia 04/24/2014  . Hemiparesis affecting left side as late effect of cerebrovascular accident ([place]) 04/24/2014  . Nontraumatic cerebral hemorrhage ([place]) 04/30/2012  . DM (diabetes mellitus) with complications ([place]) 0000000  . OSTEOPENIA 01/21/2009  . UNSPECIFIED VITAMIN D DEFICIENCY 11/19/2007  . ESSENTIAL HYPERTENSION, BENIGN 11/19/2007  . HYPERCHOLESTEROLEMIA  10/25/2006  . GASTROESOPHAGEAL REFLUX, NO ESOPHAGITIS 10/25/2006  . DIVERTICULOSIS OF COLON 10/25/2006  . Osteoarthritis 10/25/2006  . CERVICAL SPINE DISORDER, NOS 10/25/2006    Sumner Boast., PT 10/09/2019, 12:34 PM  [address] [place], [place], [address] Phone: [phone]   Fax:  [phone]  Name: Karen Jackson MRN: [id number] Date of Birth: 06-08-1941

## 2019-10-10 ENCOUNTER — Ambulatory Visit: Payer: Medicare Other | Attending: Internal Medicine

## 2019-10-10 DIAGNOSIS — Z23 Encounter for immunization: Secondary | ICD-10-CM

## 2019-10-10 NOTE — Progress Notes (Signed)
   Covid-19 Vaccination Clinic  Name:  Karen Jackson    MRN: [id number] DOB: 1941-01-07  10/10/2019  [name] was observed post Covid-19 immunization for 15 minutes without incidence. She was provided with Vaccine Information Sheet and instruction to access the V-Safe system.   [name] was instructed to call 911 with any severe reactions post vaccine: Marland Kitchen Difficulty breathing  . Swelling of your face and throat  . A fast heartbeat  . A bad rash all over your body  . Dizziness and weakness    Immunizations Administered    Name Date Dose VIS Date Route   Pfizer COVID-19 Vaccine 10/10/2019 12:34 PM 0.3 mL 08/08/2019 Intramuscular   Manufacturer: [place]   Lot: [id number]   [place]: [id number]

## 2019-10-13 ENCOUNTER — Other Ambulatory Visit: Payer: Self-pay

## 2019-10-13 ENCOUNTER — Encounter: Payer: Self-pay | Admitting: Physical Therapy

## 2019-10-13 ENCOUNTER — Ambulatory Visit: Payer: Medicare Other | Admitting: Physical Therapy

## 2019-10-13 DIAGNOSIS — M25572 Pain in left ankle and joints of left foot: Secondary | ICD-10-CM

## 2019-10-13 DIAGNOSIS — R262 Difficulty in walking, not elsewhere classified: Secondary | ICD-10-CM | POA: Diagnosis not present

## 2019-10-13 DIAGNOSIS — M6281 Muscle weakness (generalized): Secondary | ICD-10-CM

## 2019-10-13 DIAGNOSIS — I6990 Unspecified sequelae of unspecified cerebrovascular disease: Secondary | ICD-10-CM

## 2019-10-13 DIAGNOSIS — M25662 Stiffness of left knee, not elsewhere classified: Secondary | ICD-10-CM

## 2019-10-13 DIAGNOSIS — I69354 Hemiplegia and hemiparesis following cerebral infarction affecting left non-dominant side: Secondary | ICD-10-CM

## 2019-10-13 DIAGNOSIS — M25562 Pain in left knee: Secondary | ICD-10-CM

## 2019-10-13 NOTE — Therapy (Signed)
[address] [place], [place], [address] Phone: [phone]   Fax:  [phone]  Physical Therapy Treatment  Patient Details  Name: Karen Jackson MRN: [id number] Date of Birth: 07/12/1941 Referring Provider (PT): Lucey   Encounter Date: 10/13/2019  PT End of Session - 10/13/19 1310    Visit Number  33    Date for PT Re-Evaluation  10/16/19    PT Start Time  1056    PT Stop Time  1140    PT Time Calculation (min)  44 min    Activity Tolerance  Patient limited by pain    Behavior During Therapy  [facility] for tasks assessed/performed       Past Medical History:  Diagnosis Date  . Acute cystitis without hematuria   . Acute diastolic CHF (congestive heart failure) ([place])   . Arthritis   . Dyspnea   . Dysrhythmia   . Fever of unknown origin 03/19/2017  . Hyperlipidemia   . Hypertension    denies at preop  . Multifocal pneumonia   . Neuromuscular disorder ([place])    neuropathy left arm and foot  . Osteopenia   . Paralysis ([place])    partial left side from CVA   . Persistent atrial fibrillation ([place])   . PONV (postoperative nausea and vomiting)   . Pre-diabetes   . Stroke [facility]) 2013   hemmorahgic    Past Surgical History:  Procedure Laterality Date  . ANKLE SURGERY    . APPENDECTOMY    . CHOLECYSTECTOMY    . HERNIA REPAIR     Esophagus  . JOINT REPLACEMENT     total- right partial- left  . MASTECTOMY PARTIAL / LUMPECTOMY  2012   left  . ORIF ANKLE FRACTURE Left 07/20/2018   Procedure: OPEN REDUCTION INTERNAL FIXATION (ORIF) ANKLE FRACTURE;  Surgeon: Wylene Simmer, MD;  Location: [facility];  Service: Orthopedics;  Laterality: Left;  . TOTAL KNEE ARTHROPLASTY Left 01/27/2019   Procedure: TOTAL KNEE ARTHROPLASTY;  Surgeon: Vickey Huger, MD;  [facility];  Service: Orthopedics;  Laterality: Left;    There were no vitals filed for this visit.  Subjective Assessment - 10/13/19 1103    Subjective   Patient reports that yesterday she did not feel well, reports that he left leg is not working well today, some ankle rolling    Currently in Pain?  Yes    Pain Score  6     Pain Location  Shoulder    Pain Orientation  Left    Aggravating Factors   the bad weather really ruins me                       [facility] Adult PT Treatment/Exercise - 10/13/19 0001      Ambulation/Gait   Gait Comments  75'x 3 SBA, had some instances of the left toe catching, she also had severe crepitus of both shoulders today      Self-Care   Other Self-Care Comments   sprent more time with the caregiver on the machines and how to adjust them      Knee/Hip Exercises: Seated   Long Arc Quad  Left;3 sets;10 reps    Long Arc Quad Weight  5 lbs.    Ball Squeeze  red tband left leg adduction    Other Seated Knee/Hip Exercises  bolster behind her partial crunches    Other Seated Knee/Hip Exercises  left ankle green  tband , left ankle sit fit    Marching  Both;2 sets;10 reps    Marching Weights  5 lbs.    Hamstring Curl  Strengthening;Both;2 sets;10 reps    Hamstring Limitations  blue tband               PT Short Term Goals - 03/20/19 1140      PT SHORT TERM GOAL #1   Title  independent with initial HEP    Status  Achieved        PT Long Term Goals - 10/09/19 1233      PT LONG TERM GOAL #3   Title  walk 100 feet with SBA    Status  Achieved            Plan - 10/13/19 1310    Clinical Impression Statement  Limited today more due to pain, there was significant crepitus in the shoulders with walking using the walker.  She had some increased wheezing with exercises, the caregiver seems to be understanding the machines and how to adjust them    PT Next Visit Plan  conitnue to work on the transfers and the HEP    Consulted and Agree with Plan of Care  Patient       Patient will benefit from skilled therapeutic intervention in order to improve the following deficits and  impairments:  Abnormal gait, Decreased coordination, Decreased range of motion, Difficulty walking, Cardiopulmonary status limiting activity, Decreased endurance, Increased muscle spasms, Impaired UE functional use, Decreased activity tolerance, Pain, Decreased balance, Postural dysfunction, Decreased strength, Decreased mobility  Visit Diagnosis: Difficulty in walking, not elsewhere classified  Late effects of CVA (cerebrovascular accident)  Muscle weakness (generalized)  Stiffness of left knee, not elsewhere classified  Hemiplegia and hemiparesis following cerebral infarction affecting left non-dominant side (HCC)  Pain in left ankle and joints of left foot  Acute pain of left knee     Problem List Patient Active Problem List   Diagnosis Date Noted  . S/P total knee replacement 01/27/2019  . Recurrent left knee instability 07/05/2018  . Respiratory failure with hypoxia ([place]) 08/30/2017  . Hypoxemia   . Heart failure with preserved ejection fraction ([place]), Grade 3 diastolic dysfunction [id number]  . PAF (paroxysmal atrial fibrillation) ([place])   . Dyspnea 03/19/2017  . Encounter for preventive health examination 02/17/2016  . Sensorineural hearing loss (SNHL), bilateral 01/26/2016  . Morbid obesity ([place]) 06/17/2015  . Hypomagnesemia 04/24/2014  . Hemiparesis affecting left side as late effect of cerebrovascular accident ([place]) 04/24/2014  . Nontraumatic cerebral hemorrhage ([place]) 04/30/2012  . DM (diabetes mellitus) with complications ([place]) 0000000  . OSTEOPENIA 01/21/2009  . UNSPECIFIED VITAMIN D DEFICIENCY 11/19/2007  . ESSENTIAL HYPERTENSION, BENIGN 11/19/2007  . HYPERCHOLESTEROLEMIA 10/25/2006  . GASTROESOPHAGEAL REFLUX, NO ESOPHAGITIS 10/25/2006  . DIVERTICULOSIS OF COLON 10/25/2006  . Osteoarthritis 10/25/2006  . CERVICAL SPINE DISORDER, NOS 10/25/2006    Sumner Boast., PT 10/13/2019, 1:12 PM  [address] [place], [place], [address] Phone: [phone]   Fax:  [phone]  Name: RALENE COLGROVE MRN: [id number] Date of Birth: [birth year]

## 2019-10-16 ENCOUNTER — Ambulatory Visit: Payer: Medicare Other | Admitting: Physical Therapy

## 2019-10-20 ENCOUNTER — Ambulatory Visit: Payer: Medicare Other | Admitting: Physical Therapy

## 2019-10-20 ENCOUNTER — Encounter: Payer: Self-pay | Admitting: Physical Therapy

## 2019-10-20 ENCOUNTER — Other Ambulatory Visit: Payer: Self-pay

## 2019-10-20 DIAGNOSIS — M25662 Stiffness of left knee, not elsewhere classified: Secondary | ICD-10-CM

## 2019-10-20 DIAGNOSIS — M6281 Muscle weakness (generalized): Secondary | ICD-10-CM

## 2019-10-20 DIAGNOSIS — M25562 Pain in left knee: Secondary | ICD-10-CM

## 2019-10-20 DIAGNOSIS — R262 Difficulty in walking, not elsewhere classified: Secondary | ICD-10-CM

## 2019-10-20 DIAGNOSIS — I69354 Hemiplegia and hemiparesis following cerebral infarction affecting left non-dominant side: Secondary | ICD-10-CM

## 2019-10-20 DIAGNOSIS — M25572 Pain in left ankle and joints of left foot: Secondary | ICD-10-CM

## 2019-10-20 DIAGNOSIS — I6990 Unspecified sequelae of unspecified cerebrovascular disease: Secondary | ICD-10-CM

## 2019-10-20 NOTE — Therapy (Signed)
[place] [place], [place], [address] Phone: [phone]   Fax:  [phone] Progress Note Reporting Period 09/15/19 to 10/20/19 for visits 61-70  See note below for Objective Data and Assessment of Progress/Goals.      Physical Therapy Evaluation  Patient Details  Name: Karen Jackson MRN: [id number] Date of Birth: February 25, 1941 Referring Provider (PT): Lucey   Encounter Date: 10/20/2019  PT End of Session - 10/20/19 1146    Visit Number  76    Date for PT Re-Evaluation  10/22/19    PT Start Time  1057    PT Stop Time  1144    PT Time Calculation (min)  47 min    Activity Tolerance  Patient tolerated treatment well    Behavior During Therapy  [facility] for tasks assessed/performed       Past Medical History:  Diagnosis Date  . Acute cystitis without hematuria   . Acute diastolic CHF (congestive heart failure) ([place])   . Arthritis   . Dyspnea   . Dysrhythmia   . Fever of unknown origin 03/19/2017  . Hyperlipidemia   . Hypertension    denies at preop  . Multifocal pneumonia   . Neuromuscular disorder ([place])    neuropathy left arm and foot  . Osteopenia   . Paralysis ([place])    partial left side from CVA   . Persistent atrial fibrillation ([place])   . PONV (postoperative nausea and vomiting)   . Pre-diabetes   . Stroke [facility]) 2013   hemmorahgic    Past Surgical History:  Procedure Laterality Date  . ANKLE SURGERY    . APPENDECTOMY    . CHOLECYSTECTOMY    . HERNIA REPAIR     Esophagus  . JOINT REPLACEMENT     total- right partial- left  . MASTECTOMY PARTIAL / LUMPECTOMY  2012   left  . ORIF ANKLE FRACTURE Left 07/20/2018   Procedure: OPEN REDUCTION INTERNAL FIXATION (ORIF) ANKLE FRACTURE;  Surgeon: Wylene Simmer, MD;  Location: [facility];  Service: Orthopedics;  Laterality: Left;  . TOTAL KNEE ARTHROPLASTY Left 01/27/2019   Procedure: TOTAL KNEE ARTHROPLASTY;  Surgeon: Vickey Huger, MD;  [facility];  Service: Orthopedics;  Laterality: Left;    There were no vitals filed for this visit.   Subjective Assessment - 10/20/19 1101    Subjective  Patient complains of the weather, reports all joints ache and hurt on these cold and wet days    Currently in Pain?  Yes    Pain Location  Shoulder    Pain Orientation  Left    Pain Descriptors / Indicators  Aching;Sore                    Objective measurements completed on examination: See above findings.      [facility] Adult PT Treatment/Exercise - 10/20/19 0001      Ambulation/Gait   Gait Comments  110' x 2  some PT support of the left shoulder due to pain and crepitus      Self-Care   Other Self-Care Comments   today is the last visit with her normal caregive that will be doing the exercises with her, I spent a lot of time with her on how to adjust the machines and what exercises to do and what to watch for for cues, also went over with patient on some goals of the gym program  Knee/Hip Exercises: Standing   Other Standing Knee Exercises  toe touches onto 4" step lateral and in front alternatine 30 total      Knee/Hip Exercises: Seated   Other Seated Knee/Hip Exercises  bolster behind her partial crunches    Hamstring Curl  Strengthening;Both;2 sets;10 reps    Hamstring Limitations  blue tband      Knee/Hip Exercises: Supine   Other Supine Knee/Hip Exercises  small crunches, rolling side to side               PT Short Term Goals - 03/20/19 1140      PT SHORT TERM GOAL #1   Title  independent with initial HEP    Status  Achieved        PT Long Term Goals - 10/20/19 1150      PT LONG TERM GOAL #1   Title  walk with hand hold assist x 100 feet    Status  Achieved      PT LONG TERM GOAL #2   Title  increased right LE strength to 4-/5    Status  Achieved      PT LONG TERM GOAL #3   Title  walk 100 feet with SBA    Status  Achieved      PT LONG TERM GOAL #4   Title  transfer independently  with set up    Status  Partially Met      PT LONG TERM GOAL #5   Title  report no difficulty pulling her pants up when going to the bathroom    Status  Partially Met             Plan - 10/20/19 1147    Clinical Impression Statement  I am going to renew today secondary to missing a few appointments over the past month due to weather and other issues, I have finished training the primary aide with her transfers and her gym program, we will have the new aide come in next visit and try to do the same for her, this will assure a transition to an independent gym program safely and work on patient continuing to progress her functiona nd quality of life.  I had the original caregiver practice on the machines and answered her quesitons today    PT Frequency  2x / week    PT Duration  2 weeks    PT Treatment/Interventions  ADLs/Self Care Home Management;Cryotherapy;Electrical Stimulation;Iontophoresis 44m/ml Dexamethasone;Moist Heat;Ultrasound;Gait training;Balance training;Therapeutic exercise;Therapeutic activities;Functional mobility training;Patient/family education;Manual techniques;Dry needling    PT Next Visit Plan  this renewal is to cover today and one more visit to assure that the new caregiver is safe with the patient    Consulted and Agree with Plan of Care  Patient       Patient will benefit from skilled therapeutic intervention in order to improve the following deficits and impairments:  Abnormal gait, Decreased coordination, Decreased range of motion, Difficulty walking, Cardiopulmonary status limiting activity, Decreased endurance, Increased muscle spasms, Impaired UE functional use, Decreased activity tolerance, Pain, Decreased balance, Postural dysfunction, Decreased strength, Decreased mobility  Visit Diagnosis: Difficulty in walking, not elsewhere classified - Plan: PT plan of care cert/re-cert  Late effects of CVA (cerebrovascular accident) - Plan: PT plan of care  cert/re-cert  Muscle weakness (generalized) - Plan: PT plan of care cert/re-cert  Stiffness of left knee, not elsewhere classified - Plan: PT plan of care cert/re-cert  Hemiplegia and hemiparesis following cerebral infarction affecting left non-dominant  side ([place]) - Plan: PT plan of care cert/re-cert  Pain in left ankle and joints of left foot - Plan: PT plan of care cert/re-cert  Acute pain of left knee - Plan: PT plan of care cert/re-cert     Problem List Patient Active Problem List   Diagnosis Date Noted  . S/P total knee replacement 01/27/2019  . Recurrent left knee instability 07/05/2018  . Respiratory failure with hypoxia ([place]) 08/30/2017  . Hypoxemia   . Heart failure with preserved ejection fraction ([place]), Grade 3 diastolic dysfunction 92/90/9030  . PAF (paroxysmal atrial fibrillation) ([place])   . Dyspnea 03/19/2017  . Encounter for preventive health examination 02/17/2016  . Sensorineural hearing loss (SNHL), bilateral 01/26/2016  . Morbid obesity ([place]) 06/17/2015  . Hypomagnesemia 04/24/2014  . Hemiparesis affecting left side as late effect of cerebrovascular accident ([place]) 04/24/2014  . Nontraumatic cerebral hemorrhage ([place]) 04/30/2012  . DM (diabetes mellitus) with complications ([place]) 14/99/6924  . OSTEOPENIA 01/21/2009  . UNSPECIFIED VITAMIN D DEFICIENCY 11/19/2007  . ESSENTIAL HYPERTENSION, BENIGN 11/19/2007  . HYPERCHOLESTEROLEMIA 10/25/2006  . GASTROESOPHAGEAL REFLUX, NO ESOPHAGITIS 10/25/2006  . DIVERTICULOSIS OF COLON 10/25/2006  . Osteoarthritis 10/25/2006  . CERVICAL SPINE DISORDER, NOS 10/25/2006    Sumner Boast., PT 10/20/2019, 11:53 AM  [address] [place], [place], [address] Phone: [phone]   Fax:  [phone]  Name: Karen Jackson MRN: [id number] Date of Birth: [birth year]

## 2019-10-23 ENCOUNTER — Ambulatory Visit: Payer: Medicare Other | Admitting: Physical Therapy

## 2019-10-23 ENCOUNTER — Other Ambulatory Visit: Payer: Self-pay

## 2019-10-23 ENCOUNTER — Encounter: Payer: Self-pay | Admitting: Physical Therapy

## 2019-10-23 DIAGNOSIS — M6281 Muscle weakness (generalized): Secondary | ICD-10-CM

## 2019-10-23 DIAGNOSIS — I69354 Hemiplegia and hemiparesis following cerebral infarction affecting left non-dominant side: Secondary | ICD-10-CM

## 2019-10-23 DIAGNOSIS — I6990 Unspecified sequelae of unspecified cerebrovascular disease: Secondary | ICD-10-CM

## 2019-10-23 DIAGNOSIS — M25662 Stiffness of left knee, not elsewhere classified: Secondary | ICD-10-CM

## 2019-10-23 DIAGNOSIS — M25562 Pain in left knee: Secondary | ICD-10-CM

## 2019-10-23 DIAGNOSIS — R262 Difficulty in walking, not elsewhere classified: Secondary | ICD-10-CM | POA: Diagnosis not present

## 2019-10-23 DIAGNOSIS — M25572 Pain in left ankle and joints of left foot: Secondary | ICD-10-CM

## 2019-10-23 NOTE — Therapy (Signed)
[address] [place], [place], [address] Phone: [phone]   Fax:  [phone]  Physical Therapy Treatment  Patient Details  Name: Karen Jackson MRN: [id number] Date of Birth: Apr 13, 1941 Referring Provider (PT): Lucey   Encounter Date: 10/23/2019  PT End of Session - 10/23/19 1143    Visit Number  31    PT Start Time  1055    PT Stop Time  1140    PT Time Calculation (min)  45 min    Activity Tolerance  Patient tolerated treatment well    Behavior During Therapy  [facility] for tasks assessed/performed       Past Medical History:  Diagnosis Date  . Acute cystitis without hematuria   . Acute diastolic CHF (congestive heart failure) ([place])   . Arthritis   . Dyspnea   . Dysrhythmia   . Fever of unknown origin 03/19/2017  . Hyperlipidemia   . Hypertension    denies at preop  . Multifocal pneumonia   . Neuromuscular disorder ([place])    neuropathy left arm and foot  . Osteopenia   . Paralysis ([name])    partial left side from CVA   . Persistent atrial fibrillation ([place])   . PONV (postoperative nausea and vomiting)   . Pre-diabetes   . Stroke [facility]) 2013   hemmorahgic    Past Surgical History:  Procedure Laterality Date  . ANKLE SURGERY    . APPENDECTOMY    . CHOLECYSTECTOMY    . HERNIA REPAIR     Esophagus  . JOINT REPLACEMENT     total- right partial- left  . MASTECTOMY PARTIAL / LUMPECTOMY  2012   left  . ORIF ANKLE FRACTURE Left 07/20/2018   Procedure: OPEN REDUCTION INTERNAL FIXATION (ORIF) ANKLE FRACTURE;  Surgeon: Wylene Simmer, MD;  Location: [facility];  Service: Orthopedics;  Laterality: Left;  . TOTAL KNEE ARTHROPLASTY Left 01/27/2019   Procedure: TOTAL KNEE ARTHROPLASTY;  Surgeon: Vickey Huger, MD;  Location[facility];  Service: Orthopedics;  Laterality: Left;    There were no vitals filed for this visit.  Subjective Assessment - 10/23/19 1140    Subjective  Patient has her newest caregiver with  her today, reports increased left shoulder pain    Currently in Pain?  Yes    Pain Score  6     Pain Location  Shoulder    Pain Orientation  Left         [facility] PT Assessment - 10/23/19 0001      Timed Up and Go Test   Normal TUG (seconds)  39                   [facility] Adult PT Treatment/Exercise - 10/23/19 0001      Transfers   Comments  spoke with the new caregiver about transfers and what Shalita does that is unsafe and the cue to give to have her be more safe      Ambulation/Gait   Gait Comments  115'x2 SBA      Self-Care   Other Self-Care Comments   went over with the new caregiver that may bring her or exercise with her on some day the equipment and how to adjust, use and transfer with Opal Sidles on and off of them, she seemed tograsp the concept and asked appropirate questions               PT Short Term Goals - 03/20/19 1140  PT SHORT TERM GOAL #1   Title  independent with initial HEP    Status  Achieved        PT Long Term Goals - 10/23/19 1149      PT LONG TERM GOAL #1   Title  walk with hand hold assist x 100 feet    Status  Achieved      PT LONG TERM GOAL #2   Title  increased right LE strength to 4-/5    Status  Achieved      PT LONG TERM GOAL #3   Title  walk 100 feet with SBA    Status  Achieved      PT LONG TERM GOAL #4   Title  transfer independently with set up    Status  Achieved      PT LONG TERM GOAL #5   Title  report no difficulty pulling her pants up when going to the bathroom    Status  Achieved      PT LONG TERM GOAL #6   Title  decrease TUG time to 45 seconds    Status  Partially Met            Plan - 10/23/19 1144    Clinical Impression Statement  Desta was kept in PT so long due to the numerous co morbidities, she had the left ankle fracture, the stroke with hemiplegia of the left side, and the TKA on the left, she had a lot of fear, different issues with pain and the ability to transfer, her lung and  breathing issues were also things that held her back, her biggest problem as we went was unsafe transfers and the possiblity of hurting her knee and hip/ankle again with how she did it.  I feel at this time she is at her highest an dsafest level that she can be.  She still has a lot of issues but feel that her quality of life and function is so much more.  The two caregivers are safe with her and we have set up where they can bring [place] for the gym program and have taught them safe use of the equipment    PT Next Visit Plan  D/C with most goals met and reaching a plateau    Consulted and Agree with Plan of Care  Patient       Patient will benefit from skilled therapeutic intervention in order to improve the following deficits and impairments:     Visit Diagnosis: Difficulty in walking, not elsewhere classified  Late effects of CVA (cerebrovascular accident)  Muscle weakness (generalized)  Stiffness of left knee, not elsewhere classified  Hemiplegia and hemiparesis following cerebral infarction affecting left non-dominant side (HCC)  Pain in left ankle and joints of left foot  Acute pain of left knee     Problem List Patient Active Problem List   Diagnosis Date Noted  . S/P total knee replacement 01/27/2019  . Recurrent left knee instability 07/05/2018  . Respiratory failure with hypoxia ([place]) 08/30/2017  . Hypoxemia   . Heart failure with preserved ejection fraction ([place]), Grade 3 diastolic dysfunction 45/80/9983  . PAF (paroxysmal atrial fibrillation) ([place])   . Dyspnea 03/19/2017  . Encounter for preventive health examination 02/17/2016  . Sensorineural hearing loss (SNHL), bilateral 01/26/2016  . Morbid obesity ([place]) 06/17/2015  . Hypomagnesemia 04/24/2014  . Hemiparesis affecting left side as late effect of cerebrovascular accident ([place]) 04/24/2014  . Nontraumatic cerebral hemorrhage ([place]) 04/30/2012  . DM (  diabetes mellitus) with complications ([place]) 37/44/5146  .  OSTEOPENIA 01/21/2009  . UNSPECIFIED VITAMIN D DEFICIENCY 11/19/2007  . ESSENTIAL HYPERTENSION, BENIGN 11/19/2007  . HYPERCHOLESTEROLEMIA 10/25/2006  . GASTROESOPHAGEAL REFLUX, NO ESOPHAGITIS 10/25/2006  . DIVERTICULOSIS OF COLON 10/25/2006  . Osteoarthritis 10/25/2006  . CERVICAL SPINE DISORDER, NOS 10/25/2006    Sumner Boast., PT 10/23/2019, 11:53 AM  [address] [place], [place], [address] Phone: [phone]   Fax:  [phone]  Name: NABEEHA BADERTSCHER MRN: [id number] Date of Birth: 03-22-1941

## 2019-10-24 ENCOUNTER — Other Ambulatory Visit: Payer: Self-pay | Admitting: *Deleted

## 2019-10-24 DIAGNOSIS — I5030 Unspecified diastolic (congestive) heart failure: Secondary | ICD-10-CM

## 2019-10-24 DIAGNOSIS — I4891 Unspecified atrial fibrillation: Secondary | ICD-10-CM

## 2019-10-24 DIAGNOSIS — E118 Type 2 diabetes mellitus with unspecified complications: Secondary | ICD-10-CM

## 2019-10-24 DIAGNOSIS — K219 Gastro-esophageal reflux disease without esophagitis: Secondary | ICD-10-CM

## 2019-10-27 MED ORDER — OMEPRAZOLE 20 MG PO CPDR: DELAYED_RELEASE_CAPSULE | ORAL | 3 refills | Status: DC

## 2019-10-27 MED ORDER — MAGNESIUM OXIDE 400 (241.3 MG) MG PO TABS: 1.0000 | ORAL_TABLET | Freq: Every day | ORAL | 3 refills | Status: DC

## 2019-10-27 MED ORDER — ATORVASTATIN CALCIUM 20 MG PO TABS: 20.0000 mg | ORAL_TABLET | Freq: Every day | ORAL | 3 refills | Status: DC

## 2019-10-27 MED ORDER — FUROSEMIDE 80 MG PO TABS: 80.0000 mg | ORAL_TABLET | Freq: Every day | ORAL | 3 refills | Status: DC

## 2019-10-27 MED ORDER — METOPROLOL SUCCINATE ER 50 MG PO TB24: 50.0000 mg | ORAL_TABLET | Freq: Every day | ORAL | 3 refills | Status: DC

## 2019-10-27 MED ORDER — GABAPENTIN 100 MG PO CAPS: 100.0000 mg | ORAL_CAPSULE | Freq: Three times a day (TID) | ORAL | 3 refills | Status: DC

## 2019-10-27 MED ORDER — TRAZODONE HCL 50 MG PO TABS: 50.0000 mg | ORAL_TABLET | Freq: Every day | ORAL | 3 refills | Status: DC

## 2019-10-27 MED ORDER — CELECOXIB 200 MG PO CAPS: ORAL_CAPSULE | ORAL | 0 refills | Status: DC

## 2019-11-04 ENCOUNTER — Telehealth: Payer: Self-pay | Admitting: Family Medicine

## 2019-11-04 NOTE — Telephone Encounter (Signed)
Received fax from [place] wanting my office notes to justify Purewick catheters.  Called patient.  They are working great.  She can take her lasix twice a day.  She does not need anyone to stay with her at night.  Working well for her. She will make a separate office visit for me to check her A1C, get a tetanus shot and document in detail her incontinence need for Purewick catheter.  Once I have had that visit, I will fax back to [place].

## 2019-11-15 ENCOUNTER — Other Ambulatory Visit: Payer: Self-pay | Admitting: Family Medicine

## 2019-11-26 LAB — HM DIABETES EYE EXAM

## 2019-12-01 ENCOUNTER — Other Ambulatory Visit: Payer: Self-pay

## 2019-12-01 ENCOUNTER — Ambulatory Visit (INDEPENDENT_AMBULATORY_CARE_PROVIDER_SITE_OTHER): Payer: Medicare Other | Admitting: Family Medicine

## 2019-12-01 ENCOUNTER — Encounter: Payer: Self-pay | Admitting: Family Medicine

## 2019-12-01 VITALS — BP 122/58 | HR 80 | Ht 59.0 in | Wt 175.0 lb

## 2019-12-01 DIAGNOSIS — E7800 Pure hypercholesterolemia, unspecified: Secondary | ICD-10-CM

## 2019-12-01 DIAGNOSIS — D7589 Other specified diseases of blood and blood-forming organs: Secondary | ICD-10-CM | POA: Diagnosis not present

## 2019-12-01 DIAGNOSIS — D539 Nutritional anemia, unspecified: Secondary | ICD-10-CM | POA: Diagnosis not present

## 2019-12-01 DIAGNOSIS — N3946 Mixed incontinence: Secondary | ICD-10-CM

## 2019-12-01 DIAGNOSIS — E118 Type 2 diabetes mellitus with unspecified complications: Secondary | ICD-10-CM | POA: Diagnosis present

## 2019-12-01 DIAGNOSIS — I480 Paroxysmal atrial fibrillation: Secondary | ICD-10-CM

## 2019-12-01 DIAGNOSIS — I10 Essential (primary) hypertension: Secondary | ICD-10-CM

## 2019-12-01 DIAGNOSIS — I5032 Chronic diastolic (congestive) heart failure: Secondary | ICD-10-CM | POA: Diagnosis not present

## 2019-12-01 LAB — POCT GLYCOSYLATED HEMOGLOBIN (HGB A1C): HbA1c, POC (controlled diabetic range): 7.2 % — AB (ref 0.0–7.0)

## 2019-12-01 NOTE — Patient Instructions (Signed)
I am rechecking your cholesterol and B12.  I will call with results. I don't plan any additional testing for the shortness of breath. Keep working on the mobility Please have them send me the form again for the Karen Jackson.  My notes of today are all they need.   I am OK with you not getting a tetanus shot. I trust I will get a report of your eye exam.  You are diabetic.  As we discussed, you still do not need medications.

## 2019-12-01 NOTE — Assessment & Plan Note (Addendum)
b 12 checked and normal.  No further WU

## 2019-12-02 ENCOUNTER — Telehealth: Payer: Self-pay | Admitting: Family Medicine

## 2019-12-02 ENCOUNTER — Encounter: Payer: Self-pay | Admitting: Family Medicine

## 2019-12-02 DIAGNOSIS — N3946 Mixed incontinence: Secondary | ICD-10-CM | POA: Insufficient documentation

## 2019-12-02 LAB — LIPID PANEL
Chol/HDL Ratio: 3.1 ratio (ref 0.0–4.4)
Cholesterol, Total: 191 mg/dL (ref 100–199)
HDL: 62 mg/dL (ref >39)
LDL Chol Calc (NIH): 101 mg/dL — ABNORMAL HIGH (ref 0–99)
Triglycerides: 162 mg/dL — ABNORMAL HIGH (ref 0–149)
VLDL Cholesterol Cal: 28 mg/dL (ref 5–40)

## 2019-12-02 LAB — VITAMIN B12: Vitamin B-12: 646 pg/mL (ref 232–1245)

## 2019-12-02 MED ORDER — ATORVASTATIN CALCIUM 20 MG PO TABS: 40.0000 mg | ORAL_TABLET | Freq: Every day | ORAL | 3 refills | Status: DC

## 2019-12-02 NOTE — Assessment & Plan Note (Signed)
Will check B12 level

## 2019-12-02 NOTE — Assessment & Plan Note (Signed)
I think her CHF is well controled.  I believe her DOE is mostly deconditioning.

## 2019-12-02 NOTE — Assessment & Plan Note (Signed)
Well control on current meds

## 2019-12-02 NOTE — Progress Notes (Signed)
    SUBJECTIVE:   CHIEF COMPLAINT / HPI:   Several issues: Longstanding urinary incontinence.  Seems large volume.  Gives hx of both urgency and inability to hold urine.  Worsened by her limited mobility from her old CVA and her diuretic therapy for her cHF.[place]  She is alone at night and invariably has a wet bed.  She has been using Purewick catheters, 2 per day for the last several months with excellent results.  The order required documentation from recent office notes, which I did not have prior to this visit.  I am happy to prescribe the catheters. 2. Hypercholesterolemia.  No recent lipid panel.  She is diabetic and has had an old CVA.  Currently on atorvastatin 20.  No myalgias.  Needs lipid panel. 3. Due for A1C today.=7.2.  Goal A1C given her age is less than 8.  She also does not want to take additional medications.   4. DOE.  She is trying to get more active.  Notices significant DOE.  No change in ankle swelling - if anything, it is less with her taking the lasix more regularly.    OBJECTIVE:   BP (!) 122/58   Pulse 80   Ht 4\' 11"  (1.499 m)   Wt 175 lb (79.4 kg)   SpO2 92%   BMI 35.35 kg/m   VS noted and wt stable Lungs clear Cardiac RRR without m or g Abd benign Ext trace bilateral edema.  ASSESSMENT/PLAN:   Nutritional anemia b 12 checked and normal.  No further WU  DM (diabetes mellitus) with complications (HCC) Goal A1C less than 8.  No meds.  Increase statin dose.   ESSENTIAL HYPERTENSION, BENIGN Well control on current meds  Heart failure with preserved ejection fraction ([place]), Grade 3 diastolic dysfunction I think her CHF is well controled.  I believe her DOE is mostly deconditioning.  Macrocytosis Will check B12 level  HYPERCHOLESTEROLEMIA Called after labs came back.  We agreed trial of atorvastatin 40 mg.  PAF (paroxysmal atrial fibrillation) (HCC) In regular rhythm.  No anticoag because of previous hemorrhagic CVA.    Mixed stress and urge  urinary incontinence I believe purewick catheters necessary to maintain her current independent living.     Zenia Resides, MD Home

## 2019-12-02 NOTE — Assessment & Plan Note (Signed)
I believe purewick catheters necessary to maintain her current independent living.

## 2019-12-02 NOTE — Assessment & Plan Note (Signed)
Called after labs came back.  We agreed trial of atorvastatin 40 mg.

## 2019-12-02 NOTE — Assessment & Plan Note (Signed)
Goal A1C less than 8.  No meds.  Increase statin dose.

## 2019-12-02 NOTE — Telephone Encounter (Signed)
Called.  She is diabetic.  Her old CVA was hemorrhagic.  She vaguely recalls having myalgias on an old statin, not atorvastatin.  She will try increasing atorvastatin to 40 mg daily and be vigilent for developing myalgias.

## 2019-12-02 NOTE — Assessment & Plan Note (Signed)
In regular rhythm.  No anticoag because of previous hemorrhagic CVA.

## 2019-12-04 ENCOUNTER — Encounter: Payer: Self-pay | Admitting: Family Medicine

## 2019-12-18 ENCOUNTER — Other Ambulatory Visit: Payer: Self-pay | Admitting: Family Medicine

## 2020-02-12 ENCOUNTER — Other Ambulatory Visit: Payer: Self-pay | Admitting: Family Medicine

## 2020-02-12 DIAGNOSIS — K219 Gastro-esophageal reflux disease without esophagitis: Secondary | ICD-10-CM

## 2020-02-12 DIAGNOSIS — I5030 Unspecified diastolic (congestive) heart failure: Secondary | ICD-10-CM

## 2020-03-08 DIAGNOSIS — H9113 Presbycusis, bilateral: Secondary | ICD-10-CM | POA: Insufficient documentation

## 2020-05-04 ENCOUNTER — Telehealth: Payer: Self-pay

## 2020-05-04 DIAGNOSIS — R0609 Other forms of dyspnea: Secondary | ICD-10-CM

## 2020-05-04 NOTE — Telephone Encounter (Signed)
Patient calls nurse line requesting a refill for an inhaler, nebulizer machine and solution. Patient reports she has not received these medication in sometime, however she feels with the change in weather she needs them. Patient does not report anything emergent, has just noticed some mild wheezing. None of the medications are current on her list. PCP does not have any apts, and patient reports she does not want to come in, at least not until she is due for her booster vaccine in October. Please advise.

## 2020-05-06 ENCOUNTER — Other Ambulatory Visit: Payer: Self-pay | Admitting: Family Medicine

## 2020-05-11 MED ORDER — ALBUTEROL SULFATE HFA 108 (90 BASE) MCG/ACT IN AERS: 2.0000 | INHALATION_SPRAY | Freq: Four times a day (QID) | RESPIRATORY_TRACT | 2 refills | Status: DC | PRN

## 2020-05-11 NOTE — Assessment & Plan Note (Signed)
I believe the main reason for her DOE is her heart failure.  See documentation for reasons she may have a pulmonary componant.

## 2020-05-11 NOTE — Telephone Encounter (Signed)
Patient returns call to nurse line to check status of refills.   To PCP  Talbot Grumbling, RN

## 2020-05-11 NOTE — Telephone Encounter (Signed)
Patient with known CHF and no documented pulm disease requests albuterol for sig DOE.  States her pulse ox will drop below 90 with exertion and climbs with rest.  Was treated with albuterol and responded nicely with a previous bad pneumonia.  She is a remote smoker.  With her morbid obesity and CVA, she likely also has some restrictive lung disease.  She sees me in Oct.  We agreed to try an albuterol inhaler.  With my feeling this is mostly CHF, I encouraged her to listen to her body.  When she feels SOB, she should sit and rest.

## 2020-05-27 ENCOUNTER — Telehealth: Payer: Self-pay

## 2020-05-27 DIAGNOSIS — I69354 Hemiplegia and hemiparesis following cerebral infarction affecting left non-dominant side: Secondary | ICD-10-CM

## 2020-05-27 NOTE — Telephone Encounter (Signed)
Yes a new referral needs to be placed for patient since it has been over a year.   ,CMA

## 2020-05-27 NOTE — Telephone Encounter (Signed)
Pt notified.  T , CMA   

## 2020-05-27 NOTE — Telephone Encounter (Signed)
Patient calls nurse line requesting provider to place an additional order for restarting PT. Patient states that Medicare has approved this request and she just needs provider order. Patient would like for referral to be placed at [facility].   Of note, there is a referral from 09/2018 for referral to PT. I am unaware if new referral needs to be placed.   To PCP. I will also include [place]az[place] as well for further clarification.   Please advise  Talbot Grumbling, RN

## 2020-05-27 NOTE — Telephone Encounter (Signed)
Order entered as requested

## 2020-06-10 ENCOUNTER — Encounter: Payer: Self-pay | Admitting: Physical Therapy

## 2020-06-10 ENCOUNTER — Ambulatory Visit: Payer: Medicare Other | Attending: Family Medicine | Admitting: Physical Therapy

## 2020-06-10 ENCOUNTER — Other Ambulatory Visit: Payer: Self-pay

## 2020-06-10 DIAGNOSIS — M25662 Stiffness of left knee, not elsewhere classified: Secondary | ICD-10-CM | POA: Diagnosis present

## 2020-06-10 DIAGNOSIS — G8929 Other chronic pain: Secondary | ICD-10-CM | POA: Diagnosis present

## 2020-06-10 DIAGNOSIS — R270 Ataxia, unspecified: Secondary | ICD-10-CM | POA: Insufficient documentation

## 2020-06-10 DIAGNOSIS — I69354 Hemiplegia and hemiparesis following cerebral infarction affecting left non-dominant side: Secondary | ICD-10-CM | POA: Insufficient documentation

## 2020-06-10 DIAGNOSIS — M25512 Pain in left shoulder: Secondary | ICD-10-CM | POA: Insufficient documentation

## 2020-06-10 DIAGNOSIS — M6281 Muscle weakness (generalized): Secondary | ICD-10-CM | POA: Insufficient documentation

## 2020-06-10 DIAGNOSIS — R262 Difficulty in walking, not elsewhere classified: Secondary | ICD-10-CM | POA: Insufficient documentation

## 2020-06-10 DIAGNOSIS — M25572 Pain in left ankle and joints of left foot: Secondary | ICD-10-CM | POA: Diagnosis present

## 2020-06-10 DIAGNOSIS — I6990 Unspecified sequelae of unspecified cerebrovascular disease: Secondary | ICD-10-CM | POA: Insufficient documentation

## 2020-06-10 NOTE — Therapy (Signed)
[address] [place], [place], [address] Phone: [phone]   Fax:  [phone]  Physical Therapy Evaluation  Patient Details  Name: Karen Jackson MRN: [id number] Date of Birth: 11-06-1940 Referring Provider (PT): Hensel   Encounter Date: 06/10/2020   PT End of Session - 06/10/20 1048    Visit Number 1    Date for PT Re-Evaluation 08/10/20    PT Start Time 1856    PT Stop Time 1100    PT Time Calculation (min) 47 min    Activity Tolerance Patient tolerated treatment well    Behavior During Therapy [facility] for tasks assessed/performed           Past Medical History:  Diagnosis Date   Acute cystitis without hematuria    Acute diastolic CHF (congestive heart failure) (HCC)    Arthritis    Dyspnea    Dysrhythmia    Fever of unknown origin 03/19/2017   Hyperlipidemia    Hypertension    denies at preop   Multifocal pneumonia    Neuromuscular disorder ([place])    neuropathy left arm and foot   Osteopenia    Paralysis ([place])    partial left side from CVA    Persistent atrial fibrillation (HCC)    PONV (postoperative nausea and vomiting)    Pre-diabetes    Stroke [facility]) 2013   hemmorahgic    Past Surgical History:  Procedure Laterality Date   ANKLE SURGERY     APPENDECTOMY     CHOLECYSTECTOMY     HERNIA REPAIR     Esophagus   JOINT REPLACEMENT     total- right partial- left   MASTECTOMY PARTIAL / LUMPECTOMY  2012   left   ORIF ANKLE FRACTURE Left 07/20/2018   Procedure: OPEN REDUCTION INTERNAL FIXATION (ORIF) ANKLE FRACTURE;  Surgeon: Wylene Simmer, MD;  Location: [facility];  Service: Orthopedics;  Laterality: Left;   TOTAL KNEE ARTHROPLASTY Left 01/27/2019   Procedure: TOTAL KNEE ARTHROPLASTY;  Surgeon: Vickey Huger, MD;  [facility];  Service: Orthopedics;  Laterality: Left;    There were no vitals filed for this visit.    Subjective Assessment - 06/10/20 1015    Subjective  Patient was seen her last year and into this year, we saw her numerous times working on her ability to be independent and have a higher quality of life.  She had a left ankle fracture with ORIF, once she recovered she had a left TKA in June 2020.  She had a lot of difficulty due to the left side being hemiplegic due to CVA in 2013.  She lives alone with care giver a total of 9 hours.  She is unable to get up and go to the bathroom when the caregivers are not there.  She reports that back in March after PT she was able to get up on own without caregivers and go to the bathroom.  She reports that over the past 8 months she has felt less comfortable walking in the home, she reports taht she feels weak and feels unsteady and unsafe    Limitations Walking;House hold activities    Patient Stated Goals walk better, be safer    Currently in Pain? Yes    Pain Score 8     Pain Location Shoulder    Pain Orientation Left    Pain Descriptors / Indicators Aching;Sore;Spasm    Pain Type Chronic pain    Pain Onset More than a  month ago    Pain Frequency Constant    Aggravating Factors  she reports that over time she just really has had more and more left shoulder pain, elbow and hand pain, she thinks it is spasms and the difficulty using it.  any reaching increases pain pain pain is up to 9-10 with reaching, at rest pain is a 6-8/10    Pain Relieving Factors not using it, reports that [place] moving it in the past would help, reports at best now a 5/10    Effect of Pain on Daily Activities difficulty with using the left hand and arm, difficulty holding cards when she plays cards              [facility] PT Assessment - 06/10/20 0001      Assessment   Medical Diagnosis weakness, difficulty walking, left shoulder, arm and hand pain    Referring Provider (PT) Hensel    Onset Date/Surgical Date 05/20/20    Hand Dominance Right    Prior Therapy yes      Precautions   Precautions None      Balance Screen   Has the  patient fallen in the past 6 months No    Has the patient had a decrease in activity level because of a fear of falling?  Yes    Is the patient reluctant to leave their home because of a fear of falling?  Yes      Home Environment   Additional Comments has a ramp into the home, 8 hours of care.  has help for toileting and  shower, when caregiver not there she uses a purewick for toileting and is mostly in the bed      Prior Function   Level of Independence Needs assistance with ADLs;Needs assistance with homemaking;Needs assistance with gait;Needs assistance with transfers      Posture/Postural Control   Posture Comments fwd head, rounded shoulder, at times elevated shoulders      ROM / Strength   AROM / PROM / Strength AROM;Strength      AROM   Overall AROM Comments has fair motions in the left ankle with a brace for PF/DF, has no inversion and eversion, movements of the left UE and especially the hand are ataxic and difficult for her    AROM Assessment Site Knee;Shoulder    Right/Left Shoulder Left    Left Shoulder Flexion 40 Degrees    Left Shoulder ABduction 30 Degrees    Right/Left Knee Left    Left Knee Extension 5    Left Knee Flexion 88      Strength   Overall Strength Comments right hip 4-/5, left 3/5, right ankle 4-/5, left 3+/5, right knee 4+/5, left knee 3+/5    Strength Assessment Site Hand    Right/Left hand Left;Right    Right Hand Grip (lbs) 40    Left Hand Grip (lbs) 11      Transfers   Comments difficulty with transfers, usually at home reports that caregivers are transferring her      Ambulation/Gait   Gait Comments use of walker, slow, step to gait, needs close CGA      Standardized Balance Assessment   Standardized Balance Assessment Timed Up and Go Test      Timed Up and Go Test   Normal TUG (seconds) 92    TUG Comments use of FWW, very difficult to stand, close CGA  Objective measurements completed on examination:  See above findings.                 PT Short Term Goals - 06/10/20 1241      PT SHORT TERM GOAL #1   Title independent with initial HEP    Time 4    Period Weeks    Status New             PT Long Term Goals - 06/10/20 1241      PT LONG TERM GOAL #1   Title walk with hand hold assist x 100 feet    Time 12    Period Weeks    Status New      PT LONG TERM GOAL #2   Title increased right LE strength to 4-/5    Time 12    Period Weeks    Status New      PT LONG TERM GOAL #3   Title walk 100 feet with SBA using the FWW    Time 12    Period Weeks    Status New      PT LONG TERM GOAL #4   Title transfer independently with set up    Time 12    Period Weeks    Status New      PT LONG TERM GOAL #5   Title decrease TUG to 39 seconds    Time 12    Period Weeks    Status New                  Plan - 06/10/20 1237    Clinical Impression Statement Patient is very familiar to me as we have seen her for various issus over the years.  She has a left ankle fracture with ORIF at the beginning ot 2020, she recovered and had a left TKA in summer of 2020.  The issue is she had a stroke in 2013 that affected the left side significantly.  Since we saw her in February of 2021, she reports that she is having more and more difficulty walking and transferring, she reports that if she does not have a caregiver she will not get up on her own.  She reports that she is requiring a lift at times to get out of bed.  about 8 months ago she was able to walk 150 feet with walker and supervision.  She has a high PLOF and has shown a dramatic decrease in function over the past 8 months, we will begin PT intervention to address these issues.  She is also reporting significant left shoulder and arm pain    Stability/Clinical Decision Making Stable/Uncomplicated    Clinical Decision Making Low    Rehab Potential Good    PT Frequency 2x / week    PT Duration 8 weeks    PT  Treatment/Interventions ADLs/Self Care Home Management;Cryotherapy;Electrical Stimulation;Moist Heat;Gait training;Functional mobility training;Therapeutic activities;Therapeutic exercise;Balance training;Neuromuscular re-education;Patient/family education;Manual techniques    PT Next Visit Plan work on strength and function, address the left UE pain as needed    Consulted and Agree with Plan of Care Patient           Patient will benefit from skilled therapeutic intervention in order to improve the following deficits and impairments:  Abnormal gait, Cardiopulmonary status limiting activity, Decreased activity tolerance, Decreased balance, Decreased mobility, Decreased strength, Decreased coordination, Decreased range of motion, Difficulty walking, Increased muscle spasms, Pain  Visit Diagnosis: Difficulty in walking, not elsewhere classified -  Plan: PT plan of care cert/re-cert  Late effects of CVA (cerebrovascular accident) - Plan: PT plan of care cert/re-cert  Muscle weakness (generalized) - Plan: PT plan of care cert/re-cert  Hemiplegia and hemiparesis following cerebral infarction affecting left non-dominant side ([place]) - Plan: PT plan of care cert/re-cert  Ataxia, unspecified - Plan: PT plan of care cert/re-cert  Chronic left shoulder pain - Plan: PT plan of care cert/re-cert     Problem List Patient Active Problem List   Diagnosis Date Noted   Mixed stress and urge urinary incontinence 12/02/2019   Macrocytosis 12/01/2019   Nutritional anemia 12/01/2019   S/P total knee replacement 01/27/2019   Recurrent left knee instability 07/05/2018   Respiratory failure with hypoxia ([place]) 08/30/2017   Hypoxemia    Heart failure with preserved ejection fraction (HCC), Grade 3 diastolic dysfunction 64/40/3474   PAF (paroxysmal atrial fibrillation) ([place])    Dyspnea 03/19/2017   Encounter for preventive health examination 02/17/2016   Sensorineural hearing loss (SNHL),  bilateral 01/26/2016   Morbid obesity ([place]) 06/17/2015   Hypomagnesemia 04/24/2014   Hemiparesis affecting left side as late effect of cerebrovascular accident ([place]) 04/24/2014   Nontraumatic cerebral hemorrhage ([place]) 04/30/2012   DM (diabetes mellitus) with complications ([place]) 25/95/6387   OSTEOPENIA 01/21/2009   UNSPECIFIED VITAMIN D DEFICIENCY 11/19/2007   ESSENTIAL HYPERTENSION, BENIGN 11/19/2007   HYPERCHOLESTEROLEMIA 10/25/2006   GASTROESOPHAGEAL REFLUX, NO ESOPHAGITIS 10/25/2006   DIVERTICULOSIS OF COLON 10/25/2006   Osteoarthritis 10/25/2006   CERVICAL SPINE DISORDER, NOS 10/25/2006    Sumner Boast., PT 06/10/2020, 12:44 PM  [address] [place], [place], [address] Phone: [phone]   Fax:  [phone]  Name: NAKEA GOUGER MRN: [id number] Date of Birth: 1940-11-22

## 2020-06-14 ENCOUNTER — Encounter: Payer: Self-pay | Admitting: Physical Therapy

## 2020-06-14 ENCOUNTER — Other Ambulatory Visit: Payer: Self-pay

## 2020-06-14 ENCOUNTER — Ambulatory Visit: Payer: Medicare Other | Admitting: Physical Therapy

## 2020-06-14 DIAGNOSIS — M6281 Muscle weakness (generalized): Secondary | ICD-10-CM

## 2020-06-14 DIAGNOSIS — M25572 Pain in left ankle and joints of left foot: Secondary | ICD-10-CM

## 2020-06-14 DIAGNOSIS — G8929 Other chronic pain: Secondary | ICD-10-CM

## 2020-06-14 DIAGNOSIS — M25512 Pain in left shoulder: Secondary | ICD-10-CM

## 2020-06-14 DIAGNOSIS — I6990 Unspecified sequelae of unspecified cerebrovascular disease: Secondary | ICD-10-CM

## 2020-06-14 DIAGNOSIS — I69354 Hemiplegia and hemiparesis following cerebral infarction affecting left non-dominant side: Secondary | ICD-10-CM

## 2020-06-14 DIAGNOSIS — R270 Ataxia, unspecified: Secondary | ICD-10-CM

## 2020-06-14 DIAGNOSIS — R262 Difficulty in walking, not elsewhere classified: Secondary | ICD-10-CM | POA: Diagnosis not present

## 2020-06-14 DIAGNOSIS — M25662 Stiffness of left knee, not elsewhere classified: Secondary | ICD-10-CM

## 2020-06-14 NOTE — Therapy (Signed)
[place]. [place], [place], [address] Phone: [phone]   Fax:  [phone]  Physical Therapy Treatment  Patient Details  Name: Karen Jackson MRN: [id number] Date of Birth: 17-Jul-1941 Referring Provider (PT): Hensel   Encounter Date: 06/14/2020   PT End of Session - 06/14/20 1151    Visit Number 2    Date for PT Re-Evaluation 08/10/20    PT Start Time 1055    PT Stop Time 1145    PT Time Calculation (min) 50 min    Activity Tolerance Patient tolerated treatment well    Behavior During Therapy [facility] for tasks assessed/performed           Past Medical History:  Diagnosis Date  . Acute cystitis without hematuria   . Acute diastolic CHF (congestive heart failure) ([place])   . Arthritis   . Dyspnea   . Dysrhythmia   . Fever of unknown origin 03/19/2017  . Hyperlipidemia   . Hypertension    denies at preop  . Multifocal pneumonia   . Neuromuscular disorder ([place])    neuropathy left arm and foot  . Osteopenia   . Paralysis ([place])    partial left side from CVA   . Persistent atrial fibrillation ([place])   . PONV (postoperative nausea and vomiting)   . Pre-diabetes   . Stroke [facility]) 2013   hemmorahgic    Past Surgical History:  Procedure Laterality Date  . ANKLE SURGERY    . APPENDECTOMY    . CHOLECYSTECTOMY    . HERNIA REPAIR     Esophagus  . JOINT REPLACEMENT     total- right partial- left  . MASTECTOMY PARTIAL / LUMPECTOMY  2012   left  . ORIF ANKLE FRACTURE Left 07/20/2018   Procedure: OPEN REDUCTION INTERNAL FIXATION (ORIF) ANKLE FRACTURE;  Surgeon: Wylene Simmer, MD;  Location: [facility];  Service: Orthopedics;  Laterality: Left;  . TOTAL KNEE ARTHROPLASTY Left 01/27/2019   Procedure: TOTAL KNEE ARTHROPLASTY;  Surgeon: Vickey Huger, MD;  [facility];  Service: Orthopedics;  Laterality: Left;    There were no vitals filed for this visit.   Subjective Assessment - 06/14/20 1111    Subjective  Patient reports that she had a birthday party this weekend and did a lot of sitting, no issues    Currently in Pain? Yes    Pain Score 5     Pain Location Shoulder    Pain Orientation Left                             [facility] Adult PT Treatment/Exercise - 06/14/20 0001      Ambulation/Gait   Gait Comments used the parallel bars down and back and rest O2 dropped to 87%, with a short rest recovered , then walked with walker 25 feet and O2 dropped to 88%, 50 feet with walker following with the W/C      Exercises   Exercises Knee/Hip      Knee/Hip Exercises: Aerobic   Nustep level 5 x 7 minutes      Knee/Hip Exercises: Seated   Long Arc Quad Both;2 sets;10 reps    Long Arc Quad Weight 3 lbs.    Other Seated Knee/Hip Exercises yellow tband ankle PF/DF, left arm scapular retraction with yellow tband    Marching Both;2 sets;10 reps    Hamstring Curl Both;2 sets;10 reps    Hamstring Limitations red  PT Short Term Goals - 06/10/20 1241      PT SHORT TERM GOAL #1   Title independent with initial HEP    Time 4    Period Weeks    Status New             PT Long Term Goals - 06/10/20 1241      PT LONG TERM GOAL #1   Title walk with hand hold assist x 100 feet    Time 12    Period Weeks    Status New      PT LONG TERM GOAL #2   Title increased right LE strength to 4-/5    Time 12    Period Weeks    Status New      PT LONG TERM GOAL #3   Title walk 100 feet with SBA using the FWW    Time 12    Period Weeks    Status New      PT LONG TERM GOAL #4   Title transfer independently with set up    Time 12    Period Weeks    Status New      PT LONG TERM GOAL #5   Title decrease TUG to 39 seconds    Time 12    Period Weeks    Status New                 Plan - 06/14/20 1152    Clinical Impression Statement Patient does have O2 saturation that drops with walking, she recovers quickly with some cues for breathing.  Has  a lot of left arm/shoulder pain with reaching activities.  She tends to be very unsafe with the transfer to sitting from walking, does not get turned all the way and falls into the chair    PT Next Visit Plan work on strength and function, address the left UE pain as needed    Consulted and Agree with Plan of Care Patient           Patient will benefit from skilled therapeutic intervention in order to improve the following deficits and impairments:  Abnormal gait, Cardiopulmonary status limiting activity, Decreased activity tolerance, Decreased balance, Decreased mobility, Decreased strength, Decreased coordination, Decreased range of motion, Difficulty walking, Increased muscle spasms, Pain  Visit Diagnosis: Difficulty in walking, not elsewhere classified  Late effects of CVA (cerebrovascular accident)  Muscle weakness (generalized)  Hemiplegia and hemiparesis following cerebral infarction affecting left non-dominant side (HCC)  Ataxia, unspecified  Chronic left shoulder pain  Stiffness of left knee, not elsewhere classified  Pain in left ankle and joints of left foot     Problem List Patient Active Problem List   Diagnosis Date Noted  . Mixed stress and urge urinary incontinence 12/02/2019  . Macrocytosis 12/01/2019  . Nutritional anemia 12/01/2019  . S/P total knee replacement 01/27/2019  . Recurrent left knee instability 07/05/2018  . Respiratory failure with hypoxia ([place]) 08/30/2017  . Hypoxemia   . Heart failure with preserved ejection fraction ([place]), Grade 3 diastolic dysfunction 10/62/6948  . PAF (paroxysmal atrial fibrillation) ([place])   . Dyspnea 03/19/2017  . Encounter for preventive health examination 02/17/2016  . Sensorineural hearing loss (SNHL), bilateral 01/26/2016  . Morbid obesity ([place]) 06/17/2015  . Hypomagnesemia 04/24/2014  . Hemiparesis affecting left side as late effect of cerebrovascular accident ([place]) 04/24/2014  . Nontraumatic cerebral  hemorrhage ([place]) 04/30/2012  . DM (diabetes mellitus) with complications ([place]) 54/62/7035  . OSTEOPENIA 01/21/2009  .  UNSPECIFIED VITAMIN D DEFICIENCY 11/19/2007  . ESSENTIAL HYPERTENSION, BENIGN 11/19/2007  . HYPERCHOLESTEROLEMIA 10/25/2006  . GASTROESOPHAGEAL REFLUX, NO ESOPHAGITIS 10/25/2006  . DIVERTICULOSIS OF COLON 10/25/2006  . Osteoarthritis 10/25/2006  . CERVICAL SPINE DISORDER, NOS 10/25/2006    Sumner Boast., PT 06/14/2020, 11:54 AM  [place]. [place], [place], [address] Phone: [phone]   Fax:  [phone]  Name: Karen Jackson MRN: [id number] Date of Birth: Jan 21, 1941

## 2020-06-17 ENCOUNTER — Other Ambulatory Visit: Payer: Self-pay

## 2020-06-17 ENCOUNTER — Ambulatory Visit (INDEPENDENT_AMBULATORY_CARE_PROVIDER_SITE_OTHER): Payer: Medicare Other | Admitting: Family Medicine

## 2020-06-17 ENCOUNTER — Encounter: Payer: Self-pay | Admitting: Family Medicine

## 2020-06-17 VITALS — BP 112/60 | HR 67 | Ht 59.0 in | Wt 178.8 lb

## 2020-06-17 DIAGNOSIS — J9611 Chronic respiratory failure with hypoxia: Secondary | ICD-10-CM

## 2020-06-17 DIAGNOSIS — E118 Type 2 diabetes mellitus with unspecified complications: Secondary | ICD-10-CM

## 2020-06-17 DIAGNOSIS — I480 Paroxysmal atrial fibrillation: Secondary | ICD-10-CM | POA: Diagnosis not present

## 2020-06-17 DIAGNOSIS — Z23 Encounter for immunization: Secondary | ICD-10-CM

## 2020-06-17 DIAGNOSIS — I5032 Chronic diastolic (congestive) heart failure: Secondary | ICD-10-CM

## 2020-06-17 LAB — POCT GLYCOSYLATED HEMOGLOBIN (HGB A1C): HbA1c, POC (controlled diabetic range): 7.3 % — AB (ref 0.0–7.0)

## 2020-06-17 MED ORDER — OZEMPIC (0.25 OR 0.5 MG/DOSE) 2 MG/1.5ML ~~LOC~~ SOPN: 0.2500 mg | PEN_INJECTOR | SUBCUTANEOUS | 3 refills | Status: DC

## 2020-06-17 NOTE — Assessment & Plan Note (Signed)
Start on GLP1 weekly.

## 2020-06-17 NOTE — Assessment & Plan Note (Signed)
Still using O2 at night.  Will focus on wt loss.

## 2020-06-17 NOTE — Assessment & Plan Note (Signed)
In sinus now and asymptomatic

## 2020-06-17 NOTE — Assessment & Plan Note (Signed)
Seems euvolemic but I am concerned about her CO congestion.  Will get home scales so that we can determine dry wt and do weight based lasix dosing.

## 2020-06-17 NOTE — Progress Notes (Signed)
    SUBJECTIVE:   CHIEF COMPLAINT / HPI:   Multiple issues. Wants me to check ears for wax.  Decreased hearing.  Wants to be sure no wax before going back to audiologist. Late effects of CVA.  Hemiparesis and difficult ambulation.  Needs renewal of handicapped plate.  Needs forms filled out for long term care.  She is doing better now that she has started back on physical therapy. Diabetes.  Diet controled.  A1C is 7.3 which is below my goal of 8.  She wants to start on GLP1 for better control, cardiac benefit and possible wt reduction. Morbid obesity.  Eats very little.  More active now that she is back in PT. Some chest congestion and hx of CHF.  Takes daily lasix.  Does not weight herself.  No PND.   OBJECTIVE:   BP 112/60   Pulse 67   Ht 4\' 11"  (1.499 m)   Wt 178 lb 12.8 oz (81.1 kg)   SpO2 (!) 89%   BMI 36.11 kg/m   Lungs clear, no rales Cardiac RRR without m or g Abd benign Ext 1+ bilateral edeam.  ASSESSMENT/PLAN:   Respiratory failure with hypoxia (HCC) Still using O2 at night.  Will focus on wt loss.  PAF (paroxysmal atrial fibrillation) (HCC) In sinus now and asymptomatic  Morbid obesity ([place]) GLP1 for DM and wt loss.  Cont PT to be more active.  Heart failure with preserved ejection fraction (HCC), Grade 3 diastolic dysfunction Seems euvolemic but I am concerned about her CO congestion.  Will get home scales so that we can determine dry wt and do weight based lasix dosing.  DM (diabetes mellitus) with complications ([place]) Start on GLP1 weekly.     Karen Resides, MD [place]

## 2020-06-17 NOTE — Patient Instructions (Addendum)
We are going to start the diabetes medicine that also helps lose weight.  Start at the lowest dose which is 0.25mg  weekly.  After 3 weeks, decide if you ant to try the next higher dose of 0.5 mg.  They may a 1 mg.  The higher the dose, the better the diabetes control and the better your weight loss.  Unfortunately, side effects also go up. Flu shot today. OK to get COVID as soon as you are feeling better.   See me in three months if doing well. If you are still feeling congested, see me in 3-4 weeks.   Consider getting a scale so that we can base your lasix dose on your weight.

## 2020-06-17 NOTE — Assessment & Plan Note (Signed)
GLP1 for DM and wt loss.  Cont PT to be more active.

## 2020-06-18 ENCOUNTER — Encounter: Payer: Self-pay | Admitting: Physical Therapy

## 2020-06-18 ENCOUNTER — Ambulatory Visit: Payer: Medicare Other | Admitting: Physical Therapy

## 2020-06-18 DIAGNOSIS — R262 Difficulty in walking, not elsewhere classified: Secondary | ICD-10-CM

## 2020-06-18 DIAGNOSIS — G8929 Other chronic pain: Secondary | ICD-10-CM

## 2020-06-18 DIAGNOSIS — M6281 Muscle weakness (generalized): Secondary | ICD-10-CM

## 2020-06-18 DIAGNOSIS — R270 Ataxia, unspecified: Secondary | ICD-10-CM

## 2020-06-18 DIAGNOSIS — M25512 Pain in left shoulder: Secondary | ICD-10-CM

## 2020-06-18 DIAGNOSIS — I6990 Unspecified sequelae of unspecified cerebrovascular disease: Secondary | ICD-10-CM

## 2020-06-18 DIAGNOSIS — I69354 Hemiplegia and hemiparesis following cerebral infarction affecting left non-dominant side: Secondary | ICD-10-CM

## 2020-06-18 NOTE — Therapy (Signed)
[place]. [place], [place], [address] Phone: [phone]   Fax:  [phone]  Physical Therapy Treatment  Patient Details  Name: Karen Jackson MRN: [id number] Date of Birth: 01-16-1941 Referring Provider (PT): Hensel   Encounter Date: 06/18/2020   PT End of Session - 06/18/20 1103    Visit Number 3    Date for PT Re-Evaluation 08/10/20    PT Start Time 1010    PT Stop Time 4496    PT Time Calculation (min) 41 min    Activity Tolerance Patient tolerated treatment well    Behavior During Therapy [facility] for tasks assessed/performed           Past Medical History:  Diagnosis Date   Acute cystitis without hematuria    Acute diastolic CHF (congestive heart failure) (HCC)    Arthritis    Dyspnea    Dysrhythmia    Fever of unknown origin 03/19/2017   Hyperlipidemia    Hypertension    denies at preop   Multifocal pneumonia    Neuromuscular disorder ([place])    neuropathy left arm and foot   Osteopenia    Paralysis ([place])    partial left side from CVA    Persistent atrial fibrillation (HCC)    PONV (postoperative nausea and vomiting)    Pre-diabetes    Stroke [facility]) 2013   hemmorahgic    Past Surgical History:  Procedure Laterality Date   ANKLE SURGERY     APPENDECTOMY     CHOLECYSTECTOMY     HERNIA REPAIR     Esophagus   JOINT REPLACEMENT     total- right partial- left   MASTECTOMY PARTIAL / LUMPECTOMY  2012   left   ORIF ANKLE FRACTURE Left 07/20/2018   Procedure: OPEN REDUCTION INTERNAL FIXATION (ORIF) ANKLE FRACTURE;  Surgeon: Wylene Simmer, MD;  Location: [facility];  Service: Orthopedics;  Laterality: Left;   TOTAL KNEE ARTHROPLASTY Left 01/27/2019   Procedure: TOTAL KNEE ARTHROPLASTY;  Surgeon: Vickey Huger, MD;  [facility];  Service: Orthopedics;  Laterality: Left;    There were no vitals filed for this visit.   Subjective Assessment - 06/18/20 1016    Subjective Got  my flu shot yesterday arm is alittle sore    Currently in Pain? Yes    Pain Score 3     Pain Location Shoulder    Pain Orientation Left                             [facility] Adult PT Treatment/Exercise - 06/18/20 0001      Ambulation/Gait   Gait Comments walking with FWW, 2x60 feet      Knee/Hip Exercises: Aerobic   Nustep level 5 x 7 minutes      Knee/Hip Exercises: Standing   Hip Abduction Both;1 set;10 reps    Abduction Limitations 3# very difficult on the left shoulder and her    Other Standing Knee Exercises standing weight shifts    Other Standing Knee Exercises 4" toe touches one side at a time 2x10      Knee/Hip Exercises: Seated   Long Arc Quad Both;2 sets;10 reps    Long Arc Quad Weight 3 lbs.    Other Seated Knee/Hip Exercises red tband ankle PF/DF, left arm scapular retraction with yellow tband    Hamstring Curl Both;2 sets;10 reps    Hamstring Limitations red  PT Short Term Goals - 06/18/20 1105      PT SHORT TERM GOAL #1   Title independent with initial HEP    Status Partially Met             PT Long Term Goals - 06/10/20 1241      PT LONG TERM GOAL #1   Title walk with hand hold assist x 100 feet    Time 12    Period Weeks    Status New      PT LONG TERM GOAL #2   Title increased right LE strength to 4-/5    Time 12    Period Weeks    Status New      PT LONG TERM GOAL #3   Title walk 100 feet with SBA using the FWW    Time 12    Period Weeks    Status New      PT LONG TERM GOAL #4   Title transfer independently with set up    Time 12    Period Weeks    Status New      PT LONG TERM GOAL #5   Title decrease TUG to 39 seconds    Time 12    Period Weeks    Status New                 Plan - 06/18/20 1104    Clinical Impression Statement Patient really struggled with the hip abduction, she has a lot of issues with the left shoulder, with significant crepitus with this as well.  She  did very well with the 4" toe touches but needs some guarding with the left knee doing this    PT Next Visit Plan work on strength and function, address the left UE pain as needed    Consulted and Agree with Plan of Care Patient           Patient will benefit from skilled therapeutic intervention in order to improve the following deficits and impairments:  Abnormal gait, Cardiopulmonary status limiting activity, Decreased activity tolerance, Decreased balance, Decreased mobility, Decreased strength, Decreased coordination, Decreased range of motion, Difficulty walking, Increased muscle spasms, Pain  Visit Diagnosis: Difficulty in walking, not elsewhere classified  Late effects of CVA (cerebrovascular accident)  Muscle weakness (generalized)  Hemiplegia and hemiparesis following cerebral infarction affecting left non-dominant side (HCC)  Ataxia, unspecified  Chronic left shoulder pain     Problem List Patient Active Problem List   Diagnosis Date Noted   Mixed stress and urge urinary incontinence 12/02/2019   Macrocytosis 12/01/2019   Nutritional anemia 12/01/2019   S/P total knee replacement 01/27/2019   Recurrent left knee instability 07/05/2018   Respiratory failure with hypoxia ([place]) 08/30/2017   Hypoxemia    Heart failure with preserved ejection fraction (HCC), Grade 3 diastolic dysfunction 62/69/4854   PAF (paroxysmal atrial fibrillation) ([place])    Dyspnea 03/19/2017   Encounter for preventive health examination 02/17/2016   Sensorineural hearing loss (SNHL), bilateral 01/26/2016   Morbid obesity ([place]) 06/17/2015   Hypomagnesemia 04/24/2014   Hemiparesis affecting left side as late effect of cerebrovascular accident ([place]) 04/24/2014   Nontraumatic cerebral hemorrhage ([place]) 04/30/2012   DM (diabetes mellitus) with complications ([place]) 62/70/3500   OSTEOPENIA 01/21/2009   UNSPECIFIED VITAMIN D DEFICIENCY 11/19/2007   ESSENTIAL HYPERTENSION, BENIGN  11/19/2007   HYPERCHOLESTEROLEMIA 10/25/2006   GASTROESOPHAGEAL REFLUX, NO ESOPHAGITIS 10/25/2006   DIVERTICULOSIS OF COLON 10/25/2006   Osteoarthritis 10/25/2006   CERVICAL SPINE  DISORDER, NOS 10/25/2006    Sumner Boast., PT 06/18/2020, 11:05 AM  [place]. [place], [place], [address] Phone: [phone]   Fax:  [phone]  Name: Karen Jackson MRN: [id number] Date of Birth: Jan 14, 1941

## 2020-06-21 ENCOUNTER — Encounter: Payer: Self-pay | Admitting: Physical Therapy

## 2020-06-21 ENCOUNTER — Ambulatory Visit: Payer: Medicare Other | Admitting: Physical Therapy

## 2020-06-21 ENCOUNTER — Other Ambulatory Visit: Payer: Self-pay

## 2020-06-21 DIAGNOSIS — R262 Difficulty in walking, not elsewhere classified: Secondary | ICD-10-CM

## 2020-06-21 DIAGNOSIS — G8929 Other chronic pain: Secondary | ICD-10-CM

## 2020-06-21 DIAGNOSIS — R270 Ataxia, unspecified: Secondary | ICD-10-CM

## 2020-06-21 DIAGNOSIS — M6281 Muscle weakness (generalized): Secondary | ICD-10-CM

## 2020-06-21 DIAGNOSIS — M25662 Stiffness of left knee, not elsewhere classified: Secondary | ICD-10-CM

## 2020-06-21 DIAGNOSIS — M25572 Pain in left ankle and joints of left foot: Secondary | ICD-10-CM

## 2020-06-21 DIAGNOSIS — I69354 Hemiplegia and hemiparesis following cerebral infarction affecting left non-dominant side: Secondary | ICD-10-CM

## 2020-06-21 DIAGNOSIS — I6990 Unspecified sequelae of unspecified cerebrovascular disease: Secondary | ICD-10-CM

## 2020-06-21 NOTE — Therapy (Signed)
[place]. [place], [place], [address] Phone: [phone]   Fax:  [phone]  Physical Therapy Treatment  Patient Details  Name: Karen Jackson MRN: [id number] Date of Birth: August 10, 1941 Referring Provider (PT): Hensel   Encounter Date: 06/21/2020   PT End of Session - 06/21/20 1151    Visit Number 4    Date for PT Re-Evaluation 08/10/20    PT Start Time 1100    PT Stop Time 1150    PT Time Calculation (min) 50 min    Activity Tolerance Patient tolerated treatment well    Behavior During Therapy [facility] for tasks assessed/performed           Past Medical History:  Diagnosis Date  . Acute cystitis without hematuria   . Acute diastolic CHF (congestive heart failure) ([place])   . Arthritis   . Dyspnea   . Dysrhythmia   . Fever of unknown origin 03/19/2017  . Hyperlipidemia   . Hypertension    denies at preop  . Multifocal pneumonia   . Neuromuscular disorder ([place])    neuropathy left arm and foot  . Osteopenia   . Paralysis ([place])    partial left side from CVA   . Persistent atrial fibrillation ([place])   . PONV (postoperative nausea and vomiting)   . Pre-diabetes   . Stroke [facility]) 2013   hemmorahgic    Past Surgical History:  Procedure Laterality Date  . ANKLE SURGERY    . APPENDECTOMY    . CHOLECYSTECTOMY    . HERNIA REPAIR     Esophagus  . JOINT REPLACEMENT     total- right partial- left  . MASTECTOMY PARTIAL / LUMPECTOMY  2012   left  . ORIF ANKLE FRACTURE Left 07/20/2018   Procedure: OPEN REDUCTION INTERNAL FIXATION (ORIF) ANKLE FRACTURE;  Surgeon: Wylene Simmer, MD;  Location: [facility];  Service: Orthopedics;  Laterality: Left;  . TOTAL KNEE ARTHROPLASTY Left 01/27/2019   Procedure: TOTAL KNEE ARTHROPLASTY;  Surgeon: Vickey Huger, MD;  [facility];  Service: Orthopedics;  Laterality: Left;    There were no vitals filed for this visit.   Subjective Assessment - 06/21/20 1112    Subjective  Patient reports that she is already feeling a little more confident with her walking    Currently in Pain? No/denies                             [facility] Adult PT Treatment/Exercise - 06/21/20 0001      Transfers   Comments needs cues for transfers as she will typically not get fully turned and plop down      Ambulation/Gait   Gait Comments Some walking with the FWW and with HHA, walker 2x60 feet      Knee/Hip Exercises: Aerobic   Nustep level 5 x 7 minutes, a lot of cues to keep the left knee in and not go out      Knee/Hip Exercises: Standing   Other Standing Knee Exercises 4" toe touches one side at a time 2x10      Knee/Hip Exercises: Seated   Long Arc Quad Both;2 sets;10 reps    Long Arc Quad Weight 3 lbs.    Other Seated Knee/Hip Exercises red tband left hip adduction, physioball isometric abs    Other Seated Knee/Hip Exercises red tband ankle PF/DF, left arm scapular retraction with yellow tband    Hamstring Curl Both;2  sets;10 reps    Hamstring Limitations green                    PT Short Term Goals - 06/18/20 1105      PT SHORT TERM GOAL #1   Title independent with initial HEP    Status Partially Met             PT Long Term Goals - 06/10/20 1241      PT LONG TERM GOAL #1   Title walk with hand hold assist x 100 feet    Time 12    Period Weeks    Status New      PT LONG TERM GOAL #2   Title increased right LE strength to 4-/5    Time 12    Period Weeks    Status New      PT LONG TERM GOAL #3   Title walk 100 feet with SBA using the FWW    Time 12    Period Weeks    Status New      PT LONG TERM GOAL #4   Title transfer independently with set up    Time 12    Period Weeks    Status New      PT LONG TERM GOAL #5   Title decrease TUG to 39 seconds    Time 12    Period Weeks    Status New                 Plan - 06/21/20 1152    Clinical Impression Statement Patient had some left ankle pain with the brace,  had to stop and reapply the brace, I also noticed that on the nustep she really had to tone in the left leg letting it abduct and not be in a good posistion, added exercise to address this and she seemed to be able to do this well.    PT Next Visit Plan work on the LE strength and the function, as well as safety    Consulted and Agree with Plan of Care Patient           Patient will benefit from skilled therapeutic intervention in order to improve the following deficits and impairments:  Abnormal gait, Cardiopulmonary status limiting activity, Decreased activity tolerance, Decreased balance, Decreased mobility, Decreased strength, Decreased coordination, Decreased range of motion, Difficulty walking, Increased muscle spasms, Pain  Visit Diagnosis: Difficulty in walking, not elsewhere classified  Late effects of CVA (cerebrovascular accident)  Muscle weakness (generalized)  Hemiplegia and hemiparesis following cerebral infarction affecting left non-dominant side (HCC)  Ataxia, unspecified  Chronic left shoulder pain  Stiffness of left knee, not elsewhere classified  Pain in left ankle and joints of left foot     Problem List Patient Active Problem List   Diagnosis Date Noted  . Mixed stress and urge urinary incontinence 12/02/2019  . Macrocytosis 12/01/2019  . Nutritional anemia 12/01/2019  . S/P total knee replacement 01/27/2019  . Recurrent left knee instability 07/05/2018  . Respiratory failure with hypoxia ([place]) 08/30/2017  . Hypoxemia   . Heart failure with preserved ejection fraction ([place]), Grade 3 diastolic dysfunction 16/05/9603  . PAF (paroxysmal atrial fibrillation) ([place])   . Dyspnea 03/19/2017  . Encounter for preventive health examination 02/17/2016  . Sensorineural hearing loss (SNHL), bilateral 01/26/2016  . Morbid obesity ([place]) 06/17/2015  . Hypomagnesemia 04/24/2014  . Hemiparesis affecting left side as late effect of cerebrovascular accident ([place])  04/24/2014  .  Nontraumatic cerebral hemorrhage ([place]) 04/30/2012  . DM (diabetes mellitus) with complications ([place]) 33/82/5053  . OSTEOPENIA 01/21/2009  . UNSPECIFIED VITAMIN D DEFICIENCY 11/19/2007  . ESSENTIAL HYPERTENSION, BENIGN 11/19/2007  . HYPERCHOLESTEROLEMIA 10/25/2006  . GASTROESOPHAGEAL REFLUX, NO ESOPHAGITIS 10/25/2006  . DIVERTICULOSIS OF COLON 10/25/2006  . Osteoarthritis 10/25/2006  . CERVICAL SPINE DISORDER, NOS 10/25/2006    Sumner Boast., PT 06/21/2020, 11:57 AM  [place]. [place], [place], [address] Phone: [phone]   Fax:  [phone]  Name: Karen Jackson MRN: [id number] Date of Birth: May 18, 1941

## 2020-06-24 ENCOUNTER — Ambulatory Visit: Payer: Medicare Other | Admitting: Physical Therapy

## 2020-06-24 ENCOUNTER — Encounter: Payer: Self-pay | Admitting: Physical Therapy

## 2020-06-24 ENCOUNTER — Other Ambulatory Visit: Payer: Self-pay

## 2020-06-24 DIAGNOSIS — R262 Difficulty in walking, not elsewhere classified: Secondary | ICD-10-CM | POA: Diagnosis not present

## 2020-06-24 DIAGNOSIS — I6990 Unspecified sequelae of unspecified cerebrovascular disease: Secondary | ICD-10-CM

## 2020-06-24 DIAGNOSIS — I69354 Hemiplegia and hemiparesis following cerebral infarction affecting left non-dominant side: Secondary | ICD-10-CM

## 2020-06-24 DIAGNOSIS — M25662 Stiffness of left knee, not elsewhere classified: Secondary | ICD-10-CM

## 2020-06-24 DIAGNOSIS — G8929 Other chronic pain: Secondary | ICD-10-CM

## 2020-06-24 DIAGNOSIS — M25512 Pain in left shoulder: Secondary | ICD-10-CM

## 2020-06-24 DIAGNOSIS — R270 Ataxia, unspecified: Secondary | ICD-10-CM

## 2020-06-24 DIAGNOSIS — M6281 Muscle weakness (generalized): Secondary | ICD-10-CM

## 2020-06-24 NOTE — Therapy (Signed)
[place]. [place], [place], [address] Phone: [phone]   Fax:  [phone]  Physical Therapy Treatment  Patient Details  Name: Karen Jackson MRN: [id number] Date of Birth: [birth year] Referring Provider (PT): Hensel   Encounter Date: 06/24/2020   PT End of Session - 06/24/20 1142    Visit Number 5    Date for PT Re-Evaluation 08/10/20    PT Start Time 1053    PT Stop Time 1135    PT Time Calculation (min) 42 min    Activity Tolerance Patient tolerated treatment well    Behavior During Therapy [facility] for tasks assessed/performed           Past Medical History:  Diagnosis Date   Acute cystitis without hematuria    Acute diastolic CHF (congestive heart failure) (HCC)    Arthritis    Dyspnea    Dysrhythmia    Fever of unknown origin 03/19/2017   Hyperlipidemia    Hypertension    denies at preop   Multifocal pneumonia    Neuromuscular disorder ([place])    neuropathy left arm and foot   Osteopenia    Paralysis ([place])    partial left side from CVA    Persistent atrial fibrillation (HCC)    PONV (postoperative nausea and vomiting)    Pre-diabetes    Stroke [facility]) 2013   hemmorahgic    Past Surgical History:  Procedure Laterality Date   ANKLE SURGERY     APPENDECTOMY     CHOLECYSTECTOMY     HERNIA REPAIR     Esophagus   JOINT REPLACEMENT     total- right partial- left   MASTECTOMY PARTIAL / LUMPECTOMY  2012   left   ORIF ANKLE FRACTURE Left 07/20/2018   Procedure: OPEN REDUCTION INTERNAL FIXATION (ORIF) ANKLE FRACTURE;  Surgeon: Wylene Simmer, MD;  Location: [place];  Service: Orthopedics;  Laterality: Left;   TOTAL KNEE ARTHROPLASTY Left 01/27/2019   Procedure: TOTAL KNEE ARTHROPLASTY;  Surgeon: Vickey Huger, MD;  [facility];  Service: Orthopedics;  Laterality: Left;    There were no vitals filed for this visit.   Subjective Assessment - 06/24/20 1058    Subjective  Patient reports having a covid booster yesterday and feels a little fatigue and soreness    Currently in Pain? No/denies                             [facility] Adult PT Treatment/Exercise - 06/24/20 0001      Ambulation/Gait   Gait Comments walking with FWW x 88 ' no rest with CGA, she really wanted to rest at 60 feet but with encouragement she was ble to continue.      Knee/Hip Exercises: Aerobic   Nustep level 5 x 7 minutes, a lot of cues to keep the left knee in and not go out      Knee/Hip Exercises: Standing   Other Standing Knee Exercises 4" toe touches in the parallel bars alternating      Knee/Hip Exercises: Seated   Long Arc Quad Both;2 sets;10 reps    Long Arc Quad Weight 3 lbs.    Other Seated Knee/Hip Exercises red tband left hip adduction, physioball isometric abs    Other Seated Knee/Hip Exercises red tband ankle PF/DF, left arm scapular retraction with yellow tband, sit fit PF/DF  PT Short Term Goals - 06/18/20 1105      PT SHORT TERM GOAL #1   Title independent with initial HEP    Status Partially Met             PT Long Term Goals - 06/24/20 1145      PT LONG TERM GOAL #1   Title walk with hand hold assist x 100 feet    Status On-going      PT LONG TERM GOAL #2   Title increased right LE strength to 4-/5    Status On-going                 Plan - 06/24/20 1143    Clinical Impression Statement Patient really struggles at timew ith standing up, she tends to pull up but this is due to the left hemiplegia and she has difficulty pushing up.  She walks with CGA, she likes to giver her self cues of pick it up and big so she does not shuffle her feet.  Fatigues easily and needs encouragement to push her self    PT Next Visit Plan work on the LE strength and the function, as well as safety    Consulted and Agree with Plan of Care Patient           Patient will benefit from skilled therapeutic intervention  in order to improve the following deficits and impairments:  Abnormal gait, Cardiopulmonary status limiting activity, Decreased activity tolerance, Decreased balance, Decreased mobility, Decreased strength, Decreased coordination, Decreased range of motion, Difficulty walking, Increased muscle spasms, Pain  Visit Diagnosis: Difficulty in walking, not elsewhere classified  Late effects of CVA (cerebrovascular accident)  Muscle weakness (generalized)  Hemiplegia and hemiparesis following cerebral infarction affecting left non-dominant side (HCC)  Ataxia, unspecified  Chronic left shoulder pain  Stiffness of left knee, not elsewhere classified     Problem List Patient Active Problem List   Diagnosis Date Noted   Mixed stress and urge urinary incontinence 12/02/2019   Macrocytosis 12/01/2019   Nutritional anemia 12/01/2019   S/P total knee replacement 01/27/2019   Recurrent left knee instability 07/05/2018   Respiratory failure with hypoxia (HCC) 08/30/2017   Hypoxemia    Heart failure with preserved ejection fraction (HCC), Grade 3 diastolic dysfunction 37/85/8850   PAF (paroxysmal atrial fibrillation) ([place])    Dyspnea 03/19/2017   Encounter for preventive health examination 02/17/2016   Sensorineural hearing loss (SNHL), bilateral 01/26/2016   Morbid obesity ([place]) 06/17/2015   Hypomagnesemia 04/24/2014   Hemiparesis affecting left side as late effect of cerebrovascular accident ([place]) 04/24/2014   Nontraumatic cerebral hemorrhage ([place]) 04/30/2012   DM (diabetes mellitus) with complications ([place]) 27/74/1287   OSTEOPENIA 01/21/2009   UNSPECIFIED VITAMIN D DEFICIENCY 11/19/2007   ESSENTIAL HYPERTENSION, BENIGN 11/19/2007   HYPERCHOLESTEROLEMIA 10/25/2006   GASTROESOPHAGEAL REFLUX, NO ESOPHAGITIS 10/25/2006   DIVERTICULOSIS OF COLON 10/25/2006   Osteoarthritis 10/25/2006   CERVICAL SPINE DISORDER, NOS 10/25/2006    Sumner Boast.,  PT 06/24/2020, 11:46 AM  [place]. [place], [place], [address] Phone: [phone]   Fax:  [phone]  Name: Karen Jackson MRN: [id number] Date of Birth: 06-Jul-1941

## 2020-06-28 ENCOUNTER — Other Ambulatory Visit: Payer: Self-pay

## 2020-06-28 ENCOUNTER — Ambulatory Visit: Payer: Medicare Other | Attending: Family Medicine | Admitting: Physical Therapy

## 2020-06-28 ENCOUNTER — Encounter: Payer: Self-pay | Admitting: Physical Therapy

## 2020-06-28 DIAGNOSIS — M6281 Muscle weakness (generalized): Secondary | ICD-10-CM

## 2020-06-28 DIAGNOSIS — G8929 Other chronic pain: Secondary | ICD-10-CM | POA: Diagnosis present

## 2020-06-28 DIAGNOSIS — R270 Ataxia, unspecified: Secondary | ICD-10-CM | POA: Insufficient documentation

## 2020-06-28 DIAGNOSIS — M25572 Pain in left ankle and joints of left foot: Secondary | ICD-10-CM | POA: Diagnosis present

## 2020-06-28 DIAGNOSIS — M25512 Pain in left shoulder: Secondary | ICD-10-CM | POA: Insufficient documentation

## 2020-06-28 DIAGNOSIS — I6990 Unspecified sequelae of unspecified cerebrovascular disease: Secondary | ICD-10-CM

## 2020-06-28 DIAGNOSIS — R262 Difficulty in walking, not elsewhere classified: Secondary | ICD-10-CM | POA: Insufficient documentation

## 2020-06-28 DIAGNOSIS — I69354 Hemiplegia and hemiparesis following cerebral infarction affecting left non-dominant side: Secondary | ICD-10-CM | POA: Insufficient documentation

## 2020-06-28 DIAGNOSIS — M25662 Stiffness of left knee, not elsewhere classified: Secondary | ICD-10-CM | POA: Diagnosis present

## 2020-06-28 DIAGNOSIS — M25562 Pain in left knee: Secondary | ICD-10-CM | POA: Insufficient documentation

## 2020-06-28 NOTE — Therapy (Signed)
[place]. [place], [place], [address] Phone: [phone]   Fax:  [phone]  Physical Therapy Treatment  Patient Details  Name: Karen Jackson MRN: [id number] Date of Birth: March 12, 1941 Referring Provider (PT): Hensel   Encounter Date: 06/28/2020   PT End of Session - 06/28/20 1147    Visit Number 6    Date for PT Re-Evaluation 08/10/20    PT Start Time 1104    PT Stop Time 1148    PT Time Calculation (min) 44 min    Activity Tolerance Patient tolerated treatment well    Behavior During Therapy [facility] for tasks assessed/performed           Past Medical History:  Diagnosis Date   Acute cystitis without hematuria    Acute diastolic CHF (congestive heart failure) (HCC)    Arthritis    Dyspnea    Dysrhythmia    Fever of unknown origin 03/19/2017   Hyperlipidemia    Hypertension    denies at preop   Multifocal pneumonia    Neuromuscular disorder ([place])    neuropathy left arm and foot   Osteopenia    Paralysis ([place])    partial left side from CVA    Persistent atrial fibrillation (HCC)    PONV (postoperative nausea and vomiting)    Pre-diabetes    Stroke [facility]) 2013   hemmorahgic    Past Surgical History:  Procedure Laterality Date   ANKLE SURGERY     APPENDECTOMY     CHOLECYSTECTOMY     HERNIA REPAIR     Esophagus   JOINT REPLACEMENT     total- right partial- left   MASTECTOMY PARTIAL / LUMPECTOMY  2012   left   ORIF ANKLE FRACTURE Left 07/20/2018   Procedure: OPEN REDUCTION INTERNAL FIXATION (ORIF) ANKLE FRACTURE;  Surgeon: Wylene Simmer, MD;  Location: [place];  Service: Orthopedics;  Laterality: Left;   TOTAL KNEE ARTHROPLASTY Left 01/27/2019   Procedure: TOTAL KNEE ARTHROPLASTY;  Surgeon: Vickey Huger, MD;  [facility];  Service: Orthopedics;  Laterality: Left;    There were no vitals filed for this visit.   Subjective Assessment - 06/28/20 1114    Subjective Pretty  good weekend    Currently in Pain? Yes    Pain Location Shoulder    Pain Orientation Left    Aggravating Factors  I don't know why                             [facility] Adult PT Treatment/Exercise - 06/28/20 0001      Ambulation/Gait   Gait Comments with FWW x 88 feet, then 25 feet CGA with chair behind her      Knee/Hip Exercises: Aerobic   Nustep level 5 x 7 minutes, a lot of cues to keep the left knee in and not go out, also some cues for speed      Knee/Hip Exercises: Standing   Other Standing Knee Exercises standing weight shifts      Knee/Hip Exercises: Seated   Long Arc Quad Both;2 sets;10 reps    Long Arc Quad Weight 3 lbs.    Other Seated Knee/Hip Exercises red tband left hip adduction, physioball isometric abs    Other Seated Knee/Hip Exercises green tband ankle PF/DF, left arm scapular retraction with yellow tband, sit fit PF/DF    Hamstring Curl Both;2 sets;10 reps    Hamstring Limitations green  PT Short Term Goals - 06/18/20 1105      PT SHORT TERM GOAL #1   Title independent with initial HEP    Status Partially Met             PT Long Term Goals - 06/24/20 1145      PT LONG TERM GOAL #1   Title walk with hand hold assist x 100 feet    Status On-going      PT LONG TERM GOAL #2   Title increased right LE strength to 4-/5    Status On-going                 Plan - 06/28/20 1147    Clinical Impression Statement Patient with shortness of breath with walking.  She continues to struggle with the transfers, at times unsafe and sits too soon before turning fully.  has difficulty with standing on the left LE    PT Next Visit Plan check TUG    Consulted and Agree with Plan of Care Patient           Patient will benefit from skilled therapeutic intervention in order to improve the following deficits and impairments:  Abnormal gait, Cardiopulmonary status limiting activity, Decreased activity tolerance,  Decreased balance, Decreased mobility, Decreased strength, Decreased coordination, Decreased range of motion, Difficulty walking, Increased muscle spasms, Pain  Visit Diagnosis: Difficulty in walking, not elsewhere classified  Late effects of CVA (cerebrovascular accident)  Muscle weakness (generalized)  Hemiplegia and hemiparesis following cerebral infarction affecting left non-dominant side (HCC)  Ataxia, unspecified  Chronic left shoulder pain  Stiffness of left knee, not elsewhere classified  Pain in left ankle and joints of left foot  Acute pain of left knee     Problem List Patient Active Problem List   Diagnosis Date Noted   Mixed stress and urge urinary incontinence 12/02/2019   Macrocytosis 12/01/2019   Nutritional anemia 12/01/2019   S/P total knee replacement 01/27/2019   Recurrent left knee instability 07/05/2018   Respiratory failure with hypoxia ([place]) 08/30/2017   Hypoxemia    Heart failure with preserved ejection fraction (HCC), Grade 3 diastolic dysfunction 53/20/2334   PAF (paroxysmal atrial fibrillation) ([place])    Dyspnea 03/19/2017   Encounter for preventive health examination 02/17/2016   Sensorineural hearing loss (SNHL), bilateral 01/26/2016   Morbid obesity ([place]) 06/17/2015   Hypomagnesemia 04/24/2014   Hemiparesis affecting left side as late effect of cerebrovascular accident ([place]) 04/24/2014   Nontraumatic cerebral hemorrhage ([place]) 04/30/2012   DM (diabetes mellitus) with complications ([place]) 35/68/6168   OSTEOPENIA 01/21/2009   UNSPECIFIED VITAMIN D DEFICIENCY 11/19/2007   ESSENTIAL HYPERTENSION, BENIGN 11/19/2007   HYPERCHOLESTEROLEMIA 10/25/2006   GASTROESOPHAGEAL REFLUX, NO ESOPHAGITIS 10/25/2006   DIVERTICULOSIS OF COLON 10/25/2006   Osteoarthritis 10/25/2006   CERVICAL SPINE DISORDER, NOS 10/25/2006    Sumner Boast., PT 06/28/2020, 11:52 AM  [place]. [place], [place], [address] Phone: [phone]   Fax:  [phone]  Name: Karen Jackson MRN: [id number] Date of Birth: [birth year]

## 2020-07-02 ENCOUNTER — Ambulatory Visit: Payer: Medicare Other | Admitting: Physical Therapy

## 2020-07-02 ENCOUNTER — Encounter: Payer: Self-pay | Admitting: Physical Therapy

## 2020-07-02 ENCOUNTER — Other Ambulatory Visit: Payer: Self-pay

## 2020-07-02 DIAGNOSIS — R262 Difficulty in walking, not elsewhere classified: Secondary | ICD-10-CM

## 2020-07-02 DIAGNOSIS — M25562 Pain in left knee: Secondary | ICD-10-CM

## 2020-07-02 DIAGNOSIS — R270 Ataxia, unspecified: Secondary | ICD-10-CM

## 2020-07-02 DIAGNOSIS — M25572 Pain in left ankle and joints of left foot: Secondary | ICD-10-CM

## 2020-07-02 DIAGNOSIS — M25662 Stiffness of left knee, not elsewhere classified: Secondary | ICD-10-CM

## 2020-07-02 DIAGNOSIS — M6281 Muscle weakness (generalized): Secondary | ICD-10-CM

## 2020-07-02 DIAGNOSIS — I6990 Unspecified sequelae of unspecified cerebrovascular disease: Secondary | ICD-10-CM

## 2020-07-02 DIAGNOSIS — G8929 Other chronic pain: Secondary | ICD-10-CM

## 2020-07-02 DIAGNOSIS — I69354 Hemiplegia and hemiparesis following cerebral infarction affecting left non-dominant side: Secondary | ICD-10-CM

## 2020-07-02 NOTE — Therapy (Signed)
[place]. [place], [place], [address] Phone: [phone]   Fax:  [phone]  Physical Therapy Treatment  Patient Details  Name: Karen Jackson MRN: [id number] Date of Birth: 09-Sep-1940 Referring Provider (PT): Hensel   Encounter Date: 07/02/2020   PT End of Session - 07/02/20 1145    Visit Number 7    Date for PT Re-Evaluation 08/10/20    PT Start Time 1104    PT Stop Time 8786    PT Time Calculation (min) 41 min    Activity Tolerance Patient tolerated treatment well    Behavior During Therapy [facility] for tasks assessed/performed           Past Medical History:  Diagnosis Date   Acute cystitis without hematuria    Acute diastolic CHF (congestive heart failure) (HCC)    Arthritis    Dyspnea    Dysrhythmia    Fever of unknown origin 03/19/2017   Hyperlipidemia    Hypertension    denies at preop   Multifocal pneumonia    Neuromuscular disorder ([place])    neuropathy left arm and foot   Osteopenia    Paralysis ([place])    partial left side from CVA    Persistent atrial fibrillation (HCC)    PONV (postoperative nausea and vomiting)    Pre-diabetes    Stroke [facility]) 2013   hemmorahgic    Past Surgical History:  Procedure Laterality Date   ANKLE SURGERY     APPENDECTOMY     CHOLECYSTECTOMY     HERNIA REPAIR     Esophagus   JOINT REPLACEMENT     total- right partial- left   MASTECTOMY PARTIAL / LUMPECTOMY  2012   left   ORIF ANKLE FRACTURE Left 07/20/2018   Procedure: OPEN REDUCTION INTERNAL FIXATION (ORIF) ANKLE FRACTURE;  Surgeon: Wylene Simmer, MD;  Location: [place];  Service: Orthopedics;  Laterality: Left;   TOTAL KNEE ARTHROPLASTY Left 01/27/2019   Procedure: TOTAL KNEE ARTHROPLASTY;  Surgeon: Vickey Huger, MD;  [facility];  Service: Orthopedics;  Laterality: Left;    There were no vitals filed for this visit.   Subjective Assessment - 07/02/20 1106    Subjective Stiff  and sore, probably the cold weather    Currently in Pain? Yes    Pain Score 5     Pain Location Shoulder    Pain Orientation Left    Aggravating Factors  cold weather              [facility] PT Assessment - 07/02/20 0001      Ambulation/Gait   Gait Comments with FW 60'x2      Timed Up and Go Test   Normal TUG (seconds) 48                         [facility] Adult PT Treatment/Exercise - 07/02/20 0001      High Level Balance   High Level Balance Comments standing light hand hold working on standing balance      Knee/Hip Exercises: Aerobic   Nustep level 5 x 7 minutes, a lot of cues to keep the left knee in and not go out, also some cues for speed      Knee/Hip Exercises: Seated   Long Arc Quad Both;2 sets;10 reps    Long Arc Quad Weight 5 lbs.    Other Seated Knee/Hip Exercises red tband left hip adduction, physioball isometric abs  Other Seated Knee/Hip Exercises green tband ankle PF/DF, had 2# on the left ankle, had her flex hip and adduct to clear a 6" step 3x10    Hamstring Curl Both;2 sets;10 reps    Hamstring Limitations green                    PT Short Term Goals - 06/18/20 1105      PT SHORT TERM GOAL #1   Title independent with initial HEP    Status Partially Met             PT Long Term Goals - 07/02/20 1149      PT LONG TERM GOAL #1   Title walk with hand hold assist x 100 feet    Status On-going                 Plan - 07/02/20 1145    Clinical Impression Statement Patient is a little stiff and sore today blames it on the colder weather.  She really decreased her TUG, however she was sitting on a higher surface.  She has a lot of difficulty transferring safely at home and especially with new caregivers.  She tends to fall or polp, does not get all the way turned.  She also has diffiulty with the left leg turning out.    PT Next Visit Plan continue to progress    Consulted and Agree with Plan of Care Patient            Patient will benefit from skilled therapeutic intervention in order to improve the following deficits and impairments:  Abnormal gait, Cardiopulmonary status limiting activity, Decreased activity tolerance, Decreased balance, Decreased mobility, Decreased strength, Decreased coordination, Decreased range of motion, Difficulty walking, Increased muscle spasms, Pain  Visit Diagnosis: Difficulty in walking, not elsewhere classified  Late effects of CVA (cerebrovascular accident)  Muscle weakness (generalized)  Hemiplegia and hemiparesis following cerebral infarction affecting left non-dominant side (HCC)  Ataxia, unspecified  Chronic left shoulder pain  Stiffness of left knee, not elsewhere classified  Pain in left ankle and joints of left foot  Acute pain of left knee     Problem List Patient Active Problem List   Diagnosis Date Noted   Mixed stress and urge urinary incontinence 12/02/2019   Macrocytosis 12/01/2019   Nutritional anemia 12/01/2019   S/P total knee replacement 01/27/2019   Recurrent left knee instability 07/05/2018   Respiratory failure with hypoxia ([place]) 08/30/2017   Hypoxemia    Heart failure with preserved ejection fraction (HCC), Grade 3 diastolic dysfunction 88/50/2774   PAF (paroxysmal atrial fibrillation) ([place])    Dyspnea 03/19/2017   Encounter for preventive health examination 02/17/2016   Sensorineural hearing loss (SNHL), bilateral 01/26/2016   Morbid obesity ([place]) 06/17/2015   Hypomagnesemia 04/24/2014   Hemiparesis affecting left side as late effect of cerebrovascular accident ([place]) 04/24/2014   Nontraumatic cerebral hemorrhage ([place]) 04/30/2012   DM (diabetes mellitus) with complications ([place]) 12/87/8676   OSTEOPENIA 01/21/2009   UNSPECIFIED VITAMIN D DEFICIENCY 11/19/2007   ESSENTIAL HYPERTENSION, BENIGN 11/19/2007   HYPERCHOLESTEROLEMIA 10/25/2006   GASTROESOPHAGEAL REFLUX, NO ESOPHAGITIS 10/25/2006    DIVERTICULOSIS OF COLON 10/25/2006   Osteoarthritis 10/25/2006   CERVICAL SPINE DISORDER, NOS 10/25/2006    Sumner Boast., PT 07/02/2020, 11:51 AM  [place]. [place], [place], [address] Phone: [phone]   Fax:  [phone]  Name: BRYNDLE CORREDOR MRN: [id number] Date of Birth: [birth year]

## 2020-07-05 ENCOUNTER — Other Ambulatory Visit: Payer: Self-pay

## 2020-07-05 ENCOUNTER — Encounter: Payer: Self-pay | Admitting: Physical Therapy

## 2020-07-05 ENCOUNTER — Ambulatory Visit: Payer: Medicare Other | Admitting: Physical Therapy

## 2020-07-05 DIAGNOSIS — R262 Difficulty in walking, not elsewhere classified: Secondary | ICD-10-CM

## 2020-07-05 DIAGNOSIS — I69354 Hemiplegia and hemiparesis following cerebral infarction affecting left non-dominant side: Secondary | ICD-10-CM

## 2020-07-05 DIAGNOSIS — M25512 Pain in left shoulder: Secondary | ICD-10-CM

## 2020-07-05 DIAGNOSIS — G8929 Other chronic pain: Secondary | ICD-10-CM

## 2020-07-05 DIAGNOSIS — M6281 Muscle weakness (generalized): Secondary | ICD-10-CM

## 2020-07-05 DIAGNOSIS — M25662 Stiffness of left knee, not elsewhere classified: Secondary | ICD-10-CM

## 2020-07-05 DIAGNOSIS — I6990 Unspecified sequelae of unspecified cerebrovascular disease: Secondary | ICD-10-CM

## 2020-07-05 DIAGNOSIS — R270 Ataxia, unspecified: Secondary | ICD-10-CM

## 2020-07-05 DIAGNOSIS — M25572 Pain in left ankle and joints of left foot: Secondary | ICD-10-CM

## 2020-07-05 NOTE — Therapy (Signed)
[place]. [place], [place], [address] Phone: [phone]   Fax:  [phone]  Physical Therapy Treatment  Patient Details  Name: Karen Jackson MRN: [id number] Date of Birth: 11/17/1940 Referring Provider (PT): Hensel   Encounter Date: 07/05/2020   PT End of Session - 07/05/20 1143    Visit Number 8    Date for PT Re-Evaluation 08/10/20    PT Start Time 1100    PT Stop Time 1144    PT Time Calculation (min) 44 min    Activity Tolerance Patient tolerated treatment well    Behavior During Therapy [facility] for tasks assessed/performed           Past Medical History:  Diagnosis Date  . Acute cystitis without hematuria   . Acute diastolic CHF (congestive heart failure) ([place])   . Arthritis   . Dyspnea   . Dysrhythmia   . Fever of unknown origin 03/19/2017  . Hyperlipidemia   . Hypertension    denies at preop  . Multifocal pneumonia   . Neuromuscular disorder ([place])    neuropathy left arm and foot  . Osteopenia   . Paralysis ([place])    partial left side from CVA   . Persistent atrial fibrillation ([place])   . PONV (postoperative nausea and vomiting)   . Pre-diabetes   . Stroke [facility]) 2013   hemmorahgic    Past Surgical History:  Procedure Laterality Date  . ANKLE SURGERY    . APPENDECTOMY    . CHOLECYSTECTOMY    . HERNIA REPAIR     Esophagus  . JOINT REPLACEMENT     total- right partial- left  . MASTECTOMY PARTIAL / LUMPECTOMY  2012   left  . ORIF ANKLE FRACTURE Left 07/20/2018   Procedure: OPEN REDUCTION INTERNAL FIXATION (ORIF) ANKLE FRACTURE;  Surgeon: Wylene Simmer, MD;  Location: [place];  Service: Orthopedics;  Laterality: Left;  . TOTAL KNEE ARTHROPLASTY Left 01/27/2019   Procedure: TOTAL KNEE ARTHROPLASTY;  Surgeon: Vickey Huger, MD;  [facility];  Service: Orthopedics;  Laterality: Left;    There were no vitals filed for this visit.   Subjective Assessment - 07/05/20 1105    Subjective  REports still stiff but not as bad as last week    Currently in Pain? Yes    Pain Score 3     Pain Location Shoulder    Pain Orientation Left                             [facility] Adult PT Treatment/Exercise - 07/05/20 0001      Ambulation/Gait   Gait Comments with FWW x 60 feet, then x 92 feet with CGA      Knee/Hip Exercises: Aerobic   Nustep level 5 x 7 minutes, a lot of cues to keep the left knee in and not go out, also some cues for speed      Knee/Hip Exercises: Standing   Other Standing Knee Exercises 4" toe toches using the FWW      Knee/Hip Exercises: Seated   Long Arc Quad Both;2 sets;10 reps    Long Arc Quad Weight 5 lbs.    Other Seated Knee/Hip Exercises red tband left hip adduction, physioball isometric abs    Other Seated Knee/Hip Exercises green tband ankle PF/DF, had 2# on the left ankle, had her flex hip and adduct to clear a 6" step 3x10  Hamstring Curl Both;2 sets;10 reps    Hamstring Limitations green                    PT Short Term Goals - 06/18/20 1105      PT SHORT TERM GOAL #1   Title independent with initial HEP    Status Partially Met             PT Long Term Goals - 07/02/20 1149      PT LONG TERM GOAL #1   Title walk with hand hold assist x 100 feet    Status On-going                 Plan - 07/05/20 1143    Clinical Impression Statement Patient reports that at home she is able to walk from the bedroom to the living room without a break now.  She still has a very difficut time with some transfers, our first transfer from the car to the w/c was very poor and unsafe, she blamed it on the slope and the leg rests ont eh w/c, there was no slope but the leg rests were on the w/c    PT Next Visit Plan may try to add some balance    Consulted and Agree with Plan of Care Patient           Patient will benefit from skilled therapeutic intervention in order to improve the following deficits and impairments:   Abnormal gait, Cardiopulmonary status limiting activity, Decreased activity tolerance, Decreased balance, Decreased mobility, Decreased strength, Decreased coordination, Decreased range of motion, Difficulty walking, Increased muscle spasms, Pain  Visit Diagnosis: Difficulty in walking, not elsewhere classified  Late effects of CVA (cerebrovascular accident)  Muscle weakness (generalized)  Hemiplegia and hemiparesis following cerebral infarction affecting left non-dominant side (HCC)  Ataxia, unspecified  Chronic left shoulder pain  Stiffness of left knee, not elsewhere classified  Pain in left ankle and joints of left foot     Problem List Patient Active Problem List   Diagnosis Date Noted  . Mixed stress and urge urinary incontinence 12/02/2019  . Macrocytosis 12/01/2019  . Nutritional anemia 12/01/2019  . S/P total knee replacement 01/27/2019  . Recurrent left knee instability 07/05/2018  . Respiratory failure with hypoxia ([place]) 08/30/2017  . Hypoxemia   . Heart failure with preserved ejection fraction ([place]), Grade 3 diastolic dysfunction 84/66/5993  . PAF (paroxysmal atrial fibrillation) ([place])   . Dyspnea 03/19/2017  . Encounter for preventive health examination 02/17/2016  . Sensorineural hearing loss (SNHL), bilateral 01/26/2016  . Morbid obesity ([place]) 06/17/2015  . Hypomagnesemia 04/24/2014  . Hemiparesis affecting left side as late effect of cerebrovascular accident ([place]) 04/24/2014  . Nontraumatic cerebral hemorrhage ([place]) 04/30/2012  . DM (diabetes mellitus) with complications ([place]) 57/08/7791  . OSTEOPENIA 01/21/2009  . UNSPECIFIED VITAMIN D DEFICIENCY 11/19/2007  . ESSENTIAL HYPERTENSION, BENIGN 11/19/2007  . HYPERCHOLESTEROLEMIA 10/25/2006  . GASTROESOPHAGEAL REFLUX, NO ESOPHAGITIS 10/25/2006  . DIVERTICULOSIS OF COLON 10/25/2006  . Osteoarthritis 10/25/2006  . CERVICAL SPINE DISORDER, NOS 10/25/2006    Sumner Boast., PT 07/05/2020, 11:46  AM  [place]. [place], [place], [address] Phone: [phone]   Fax:  [phone]  Name: Karen Jackson MRN: [id number] Date of Birth: 1941/04/11

## 2020-07-06 ENCOUNTER — Ambulatory Visit (INDEPENDENT_AMBULATORY_CARE_PROVIDER_SITE_OTHER): Payer: Medicare Other | Admitting: Podiatry

## 2020-07-06 DIAGNOSIS — M79609 Pain in unspecified limb: Secondary | ICD-10-CM

## 2020-07-06 DIAGNOSIS — B351 Tinea unguium: Secondary | ICD-10-CM | POA: Diagnosis not present

## 2020-07-06 MED ORDER — CICLOPIROX 8 % EX SOLN: Freq: Every day | CUTANEOUS | 0 refills | Status: DC

## 2020-07-06 NOTE — Progress Notes (Signed)
    Subjective:  Patient ID: MADA SADIK, female    DOB: 1940-12-23,  MRN: [id number]  Chief Complaint  Patient presents with  . Nail Problem    Nail trim 1-5 bilateral  . Nail Problem    "I think I have nail fungus my nails look funny"    79 y.o. female presents with the above complaint. History confirmed with patient. States that the nails hurt her and are very long and she cannot cut them.  Objective:  Physical Exam: warm, good capillary refill, nail exam onychomycosis of the toenails, no trophic changes or ulcerative lesions. DP pulses palpable, PT pulses palpable and protective sensation intact   No images are attached to the encounter.  Assessment:   1. Pain due to onychomycosis of nail    Plan:  Patient was evaluated and treated and all questions answered.  Onychomycosis  -Rx penlac -Nails palliatively debrided secondary to pain  Procedure: Nail Debridement Type of Debridement: manual, sharp debridement. Instrumentation: Nail nipper, rotary burr. Number of Nails: 10    No follow-ups on file.

## 2020-07-08 ENCOUNTER — Ambulatory Visit: Payer: Medicare Other | Admitting: Physical Therapy

## 2020-07-08 ENCOUNTER — Encounter: Payer: Self-pay | Admitting: Physical Therapy

## 2020-07-08 ENCOUNTER — Other Ambulatory Visit: Payer: Self-pay

## 2020-07-08 DIAGNOSIS — M6281 Muscle weakness (generalized): Secondary | ICD-10-CM

## 2020-07-08 DIAGNOSIS — G8929 Other chronic pain: Secondary | ICD-10-CM

## 2020-07-08 DIAGNOSIS — I69354 Hemiplegia and hemiparesis following cerebral infarction affecting left non-dominant side: Secondary | ICD-10-CM

## 2020-07-08 DIAGNOSIS — M25512 Pain in left shoulder: Secondary | ICD-10-CM

## 2020-07-08 DIAGNOSIS — I6990 Unspecified sequelae of unspecified cerebrovascular disease: Secondary | ICD-10-CM

## 2020-07-08 DIAGNOSIS — R262 Difficulty in walking, not elsewhere classified: Secondary | ICD-10-CM

## 2020-07-08 DIAGNOSIS — R270 Ataxia, unspecified: Secondary | ICD-10-CM

## 2020-07-08 DIAGNOSIS — M25662 Stiffness of left knee, not elsewhere classified: Secondary | ICD-10-CM

## 2020-07-08 DIAGNOSIS — M25572 Pain in left ankle and joints of left foot: Secondary | ICD-10-CM

## 2020-07-08 NOTE — Therapy (Signed)
[facility]. [place], [place], [address] Phone: [phone]   Fax:  [phone]  Physical Therapy Treatment  Patient Details  Name: Karen Jackson MRN: [id number] Date of Birth: 01/22/1941 Referring Provider (PT): Hensel   Encounter Date: 07/08/2020   PT End of Session - 07/08/20 1147    Visit Number 9    Date for PT Re-Evaluation 08/10/20    PT Start Time 1100    PT Stop Time 1145    PT Time Calculation (min) 45 min    Activity Tolerance Patient tolerated treatment well    Behavior During Therapy [facility] for tasks assessed/performed           Past Medical History:  Diagnosis Date  . Acute cystitis without hematuria   . Acute diastolic CHF (congestive heart failure) ([place])   . Arthritis   . Dyspnea   . Dysrhythmia   . Fever of unknown origin 03/19/2017  . Hyperlipidemia   . Hypertension    denies at preop  . Multifocal pneumonia   . Neuromuscular disorder ([place])    neuropathy left arm and foot  . Osteopenia   . Paralysis ([place])    partial left side from CVA   . Persistent atrial fibrillation ([place])   . PONV (postoperative nausea and vomiting)   . Pre-diabetes   . Stroke [facility] 2013   hemmorahgic    Past Surgical History:  Procedure Laterality Date  . ANKLE SURGERY    . APPENDECTOMY    . CHOLECYSTECTOMY    . HERNIA REPAIR     Esophagus  . JOINT REPLACEMENT     total- right partial- left  . MASTECTOMY PARTIAL / LUMPECTOMY  2012   left  . ORIF ANKLE FRACTURE Left 07/20/2018   Procedure: OPEN REDUCTION INTERNAL FIXATION (ORIF) ANKLE FRACTURE;  Surgeon: Wylene Simmer, MD;  Location: [place];  Service: Orthopedics;  Laterality: Left;  . TOTAL KNEE ARTHROPLASTY Left 01/27/2019   Procedure: TOTAL KNEE ARTHROPLASTY;  Surgeon: Vickey Huger, MD;  [facility];  Service: Orthopedics;  Laterality: Left;    There were no vitals filed for this visit.   Subjective Assessment - 07/08/20 1104    Subjective  Feeling good, I think I am doing better with my transfers    Currently in Pain? No/denies                             [facility] Adult PT Treatment/Exercise - 07/08/20 0001      Ambulation/Gait   Gait Comments with FWW 60 ' x 3      High Level Balance   High Level Balance Comments in parallel bars 4" toe touches, high knee maches, side stepping, reaching to challenge balance, and then working on posture and looking up      Knee/Hip Exercises: Aerobic   Nustep Level 5 x 7 minutes, cues at first about the knee going out      Knee/Hip Exercises: Seated   Other Seated Knee/Hip Exercises green  tband left hip adduction, physioball isometric abs    Other Seated Knee/Hip Exercises green tband ankle PF/DF, had 2# on the left ankle, had her flex hip and adduct to clear a 6" step 3x10    Hamstring Curl Both;2 sets;10 reps    Hamstring Limitations green                    PT Short Term  Goals - 06/18/20 1105      PT SHORT TERM GOAL #1   Title independent with initial HEP    Status Partially Met             PT Long Term Goals - 07/08/20 1150      PT LONG TERM GOAL #1   Title walk with hand hold assist x 100 feet    Status On-going      PT LONG TERM GOAL #5   Title decrease TUG to 39 seconds    Status On-going                 Plan - 07/08/20 1148    Clinical Impression Statement Worked a little more today on posture while standing and walking, she tends to be looking down and very forward flexed trunk.  She fatigues and gets short of breath easily.  She has difficulty with the posture.  She has difficulty standing in the left leg, at times needing PT blocking to get good weight bearing, as she tends to not be able to hold and the knee buckles    PT Next Visit Plan continue to add balance    Consulted and Agree with Plan of Care Patient           Patient will benefit from skilled therapeutic intervention in order to improve the following deficits  and impairments:  Abnormal gait, Cardiopulmonary status limiting activity, Decreased activity tolerance, Decreased balance, Decreased mobility, Decreased strength, Decreased coordination, Decreased range of motion, Difficulty walking, Increased muscle spasms, Pain  Visit Diagnosis: Difficulty in walking, not elsewhere classified  Late effects of CVA (cerebrovascular accident)  Muscle weakness (generalized)  Hemiplegia and hemiparesis following cerebral infarction affecting left non-dominant side (HCC)  Ataxia, unspecified  Chronic left shoulder pain  Stiffness of left knee, not elsewhere classified  Pain in left ankle and joints of left foot     Problem List Patient Active Problem List   Diagnosis Date Noted  . Presbycusis of both ears 03/08/2020  . Mixed stress and urge urinary incontinence 12/02/2019  . Macrocytosis 12/01/2019  . Nutritional anemia 12/01/2019  . S/P total knee replacement 01/27/2019  . Recurrent left knee instability 07/05/2018  . Respiratory failure with hypoxia ([place]) 08/30/2017  . Hypoxemia   . Heart failure with preserved ejection fraction ([place]), Grade 3 diastolic dysfunction 03/30/2335  . PAF (paroxysmal atrial fibrillation) ([place])   . Dyspnea 03/19/2017  . Encounter for preventive health examination 02/17/2016  . Sensorineural hearing loss (SNHL), bilateral 01/26/2016  . Morbid obesity ([place]) 06/17/2015  . Hypomagnesemia 04/24/2014  . Hemiparesis affecting left side as late effect of cerebrovascular accident ([place]) 04/24/2014  . Nontraumatic cerebral hemorrhage ([place]) 04/30/2012  . DM (diabetes mellitus) with complications ([place]) 08/20/4974  . OSTEOPENIA 01/21/2009  . UNSPECIFIED VITAMIN D DEFICIENCY 11/19/2007  . ESSENTIAL HYPERTENSION, BENIGN 11/19/2007  . HYPERCHOLESTEROLEMIA 10/25/2006  . GASTROESOPHAGEAL REFLUX, NO ESOPHAGITIS 10/25/2006  . DIVERTICULOSIS OF COLON 10/25/2006  . Osteoarthritis 10/25/2006  . CERVICAL SPINE DISORDER, NOS  10/25/2006    Sumner Boast., PT 07/08/2020, 11:51 AM  [facility]. [place], [place], [address] Phone: [phone]   Fax:  [phone]  Name: Karen Jackson MRN: [id number] Date of Birth: July 07, 1941

## 2020-07-12 ENCOUNTER — Encounter: Payer: Self-pay | Admitting: Physical Therapy

## 2020-07-12 ENCOUNTER — Ambulatory Visit: Payer: Medicare Other | Admitting: Physical Therapy

## 2020-07-12 ENCOUNTER — Other Ambulatory Visit: Payer: Self-pay

## 2020-07-12 DIAGNOSIS — M25572 Pain in left ankle and joints of left foot: Secondary | ICD-10-CM

## 2020-07-12 DIAGNOSIS — R262 Difficulty in walking, not elsewhere classified: Secondary | ICD-10-CM | POA: Diagnosis not present

## 2020-07-12 DIAGNOSIS — M25562 Pain in left knee: Secondary | ICD-10-CM

## 2020-07-12 DIAGNOSIS — I6990 Unspecified sequelae of unspecified cerebrovascular disease: Secondary | ICD-10-CM

## 2020-07-12 DIAGNOSIS — M6281 Muscle weakness (generalized): Secondary | ICD-10-CM

## 2020-07-12 DIAGNOSIS — R270 Ataxia, unspecified: Secondary | ICD-10-CM

## 2020-07-12 DIAGNOSIS — G8929 Other chronic pain: Secondary | ICD-10-CM

## 2020-07-12 DIAGNOSIS — M25662 Stiffness of left knee, not elsewhere classified: Secondary | ICD-10-CM

## 2020-07-12 DIAGNOSIS — I69354 Hemiplegia and hemiparesis following cerebral infarction affecting left non-dominant side: Secondary | ICD-10-CM

## 2020-07-12 NOTE — Therapy (Signed)
[facility]. [place], [place], [address] Phone: [phone]   Fax:  [phone] Progress Note Reporting Period 06/10/20  to 07/12/20 for the first 10 visits  See note below for Objective Data and Assessment of Progress/Goals.      Physical Therapy Treatment  Patient Details  Name: Karen Jackson MRN: [id number] Date of Birth: [birth year] Referring Provider (PT): Hensel   Encounter Date: 07/12/2020   PT End of Session - 07/12/20 1146    Visit Number 10    Date for PT Re-Evaluation 08/10/20    PT Start Time 1103    PT Stop Time 1148    PT Time Calculation (min) 45 min    Activity Tolerance Patient tolerated treatment well    Behavior During Therapy [facility] for tasks assessed/performed           Past Medical History:  Diagnosis Date   Acute cystitis without hematuria    Acute diastolic CHF (congestive heart failure) (HCC)    Arthritis    Dyspnea    Dysrhythmia    Fever of unknown origin 03/19/2017   Hyperlipidemia    Hypertension    denies at preop   Multifocal pneumonia    Neuromuscular disorder ([place])    neuropathy left arm and foot   Osteopenia    Paralysis ([place])    partial left side from CVA    Persistent atrial fibrillation (HCC)    PONV (postoperative nausea and vomiting)    Pre-diabetes    Stroke [facility]) 2013   hemmorahgic    Past Surgical History:  Procedure Laterality Date   ANKLE SURGERY     APPENDECTOMY     CHOLECYSTECTOMY     HERNIA REPAIR     Esophagus   JOINT REPLACEMENT     total- right partial- left   MASTECTOMY PARTIAL / LUMPECTOMY  2012   left   ORIF ANKLE FRACTURE Left 07/20/2018   Procedure: OPEN REDUCTION INTERNAL FIXATION (ORIF) ANKLE FRACTURE;  Surgeon: Wylene Simmer, MD;  Location: [place];  Service: Orthopedics;  Laterality: Left;   TOTAL KNEE ARTHROPLASTY Left 01/27/2019   Procedure: TOTAL KNEE ARTHROPLASTY;  Surgeon: Vickey Huger, MD;  [facility];  Service: Orthopedics;  Laterality: Left;    There were no vitals filed for this visit.   Subjective Assessment - 07/12/20 1111    Subjective I am sore, the cold weather is really getting my shoulders    Currently in Pain? Yes    Pain Score 4     Pain Location Shoulder    Pain Orientation Left    Pain Descriptors / Indicators Aching;Sore    Aggravating Factors  cold weather                             [facility] Adult PT Treatment/Exercise - 07/12/20 0001      Ambulation/Gait   Gait Comments FWW x 70 feet, then 100 feet needed one small stop to rest, CGA and follow with W/C      Knee/Hip Exercises: Aerobic   Nustep Level 5 x 7 minutes, cues at first about the knee going out      Knee/Hip Exercises: Seated   Long Arc Quad Both;3 sets;10 reps    Long Arc Quad Weight 5 lbs.    Other Seated Knee/Hip Exercises green  tband left hip adduction, physioball isometric abs    Other Seated Knee/Hip Exercises green tband  ankle PF/DF, had 2# on the left ankle, had her flex hip and adduct to clear a 6" step 3x10, reaching and stacking cones with the left hand    Hamstring Curl Both;3 sets;10 reps    Hamstring Limitations green tband                    PT Short Term Goals - 06/18/20 1105      PT SHORT TERM GOAL #1   Title independent with initial HEP    Status Partially Met             PT Long Term Goals - 07/08/20 1150      PT LONG TERM GOAL #1   Title walk with hand hold assist x 100 feet    Status On-going      PT LONG TERM GOAL #5   Title decrease TUG to 39 seconds    Status On-going                 Plan - 07/12/20 1146    Clinical Impression Statement Working to progress her to be more functional and have a higher quality of life at home, she reports that she cannot walk form her bedroom to the living room ~ 50 feet at home, reports that she has to go from [facility] to a stone foyer and then back to [place], reports that she cannot do  this due to the need to pick up walker on stone and she does not feel safe with this.    PT Next Visit Plan may try her lifting her walker some to simulate the foyer walking    Consulted and Agree with Plan of Care Patient           Patient will benefit from skilled therapeutic intervention in order to improve the following deficits and impairments:  Abnormal gait, Cardiopulmonary status limiting activity, Decreased activity tolerance, Decreased balance, Decreased mobility, Decreased strength, Decreased coordination, Decreased range of motion, Difficulty walking, Increased muscle spasms, Pain  Visit Diagnosis: Difficulty in walking, not elsewhere classified  Late effects of CVA (cerebrovascular accident)  Muscle weakness (generalized)  Hemiplegia and hemiparesis following cerebral infarction affecting left non-dominant side (HCC)  Ataxia, unspecified  Chronic left shoulder pain  Stiffness of left knee, not elsewhere classified  Pain in left ankle and joints of left foot  Acute pain of left knee     Problem List Patient Active Problem List   Diagnosis Date Noted   Presbycusis of both ears 03/08/2020   Mixed stress and urge urinary incontinence 12/02/2019   Macrocytosis 12/01/2019   Nutritional anemia 12/01/2019   S/P total knee replacement 01/27/2019   Recurrent left knee instability 07/05/2018   Respiratory failure with hypoxia ([place]) 08/30/2017   Hypoxemia    Heart failure with preserved ejection fraction (HCC), Grade 3 diastolic dysfunction 32/99/2426   PAF (paroxysmal atrial fibrillation) ([place])    Dyspnea 03/19/2017   Encounter for preventive health examination 02/17/2016   Sensorineural hearing loss (SNHL), bilateral 01/26/2016   Morbid obesity ([place]) 06/17/2015   Hypomagnesemia 04/24/2014   Hemiparesis affecting left side as late effect of cerebrovascular accident ([place]) 04/24/2014   Nontraumatic cerebral hemorrhage ([place]) 04/30/2012   DM  (diabetes mellitus) with complications ([place]) 83/41/9622   OSTEOPENIA 01/21/2009   UNSPECIFIED VITAMIN D DEFICIENCY 11/19/2007   ESSENTIAL HYPERTENSION, BENIGN 11/19/2007   HYPERCHOLESTEROLEMIA 10/25/2006   GASTROESOPHAGEAL REFLUX, NO ESOPHAGITIS 10/25/2006   DIVERTICULOSIS OF COLON 10/25/2006   Osteoarthritis 10/25/2006  CERVICAL SPINE DISORDER, NOS 10/25/2006    Sumner Boast., PT 07/12/2020, 11:49 AM  [facility]. [place], [place], [address] Phone: [phone]   Fax:  [phone]  Name: Karen Jackson MRN: [id number] Date of Birth: 1941-02-15

## 2020-07-16 ENCOUNTER — Ambulatory Visit: Payer: Medicare Other | Admitting: Physical Therapy

## 2020-07-16 ENCOUNTER — Encounter: Payer: Self-pay | Admitting: Physical Therapy

## 2020-07-16 ENCOUNTER — Other Ambulatory Visit: Payer: Self-pay

## 2020-07-16 DIAGNOSIS — M6281 Muscle weakness (generalized): Secondary | ICD-10-CM

## 2020-07-16 DIAGNOSIS — M25572 Pain in left ankle and joints of left foot: Secondary | ICD-10-CM

## 2020-07-16 DIAGNOSIS — M25562 Pain in left knee: Secondary | ICD-10-CM

## 2020-07-16 DIAGNOSIS — G8929 Other chronic pain: Secondary | ICD-10-CM

## 2020-07-16 DIAGNOSIS — M25512 Pain in left shoulder: Secondary | ICD-10-CM

## 2020-07-16 DIAGNOSIS — I69354 Hemiplegia and hemiparesis following cerebral infarction affecting left non-dominant side: Secondary | ICD-10-CM

## 2020-07-16 DIAGNOSIS — R262 Difficulty in walking, not elsewhere classified: Secondary | ICD-10-CM

## 2020-07-16 DIAGNOSIS — I6990 Unspecified sequelae of unspecified cerebrovascular disease: Secondary | ICD-10-CM

## 2020-07-16 DIAGNOSIS — R270 Ataxia, unspecified: Secondary | ICD-10-CM

## 2020-07-16 DIAGNOSIS — M25662 Stiffness of left knee, not elsewhere classified: Secondary | ICD-10-CM

## 2020-07-16 NOTE — Therapy (Signed)
[facility]. [place], [place], [address] Phone: [phone]   Fax:  [phone]  Physical Therapy Treatment  Patient Details  Name: Karen Jackson MRN: [id number] Date of Birth: 03-29-1941 Referring Provider (PT): Hensel   Encounter Date: 07/16/2020   PT End of Session - 07/16/20 1122    Visit Number 11    Date for PT Re-Evaluation 08/10/20    PT Start Time 26    PT Stop Time 0145    PT Time Calculation (min) 885 min    Activity Tolerance Patient tolerated treatment well    Behavior During Therapy [facility] for tasks assessed/performed           Past Medical History:  Diagnosis Date   Acute cystitis without hematuria    Acute diastolic CHF (congestive heart failure) ([place])    Arthritis    Dyspnea    Dysrhythmia    Fever of unknown origin 03/19/2017   Hyperlipidemia    Hypertension    denies at preop   Multifocal pneumonia    Neuromuscular disorder ([place])    neuropathy left arm and foot   Osteopenia    Paralysis ([place])    partial left side from CVA    Persistent atrial fibrillation (HCC)    PONV (postoperative nausea and vomiting)    Pre-diabetes    Stroke [facility]) 2013   hemmorahgic    Past Surgical History:  Procedure Laterality Date   ANKLE SURGERY     APPENDECTOMY     CHOLECYSTECTOMY     HERNIA REPAIR     Esophagus   JOINT REPLACEMENT     total- right partial- left   MASTECTOMY PARTIAL / LUMPECTOMY  2012   left   ORIF ANKLE FRACTURE Left 07/20/2018   Procedure: OPEN REDUCTION INTERNAL FIXATION (ORIF) ANKLE FRACTURE;  Surgeon: Wylene Simmer, MD;  Location: [place];  Service: Orthopedics;  Laterality: Left;   TOTAL KNEE ARTHROPLASTY Left 01/27/2019   Procedure: TOTAL KNEE ARTHROPLASTY;  Surgeon: Vickey Huger, MD;  [facility];  Service: Orthopedics;  Laterality: Left;    There were no vitals filed for this visit.   Subjective Assessment - 07/16/20 1115    Subjective  Reports still stiff and sore in all the joints    Currently in Pain? Yes    Pain Score 5     Pain Location Shoulder    Pain Orientation Left                             [facility] Adult PT Treatment/Exercise - 07/16/20 0001      Ambulation/Gait   Gait Comments FWW 75' very fatigued but we did this prior to any exercise, then tried some with her raising the walker and lifting over a cane on the floor to simulate her rough stone foyer.  Gait with FWW 100 feet      Knee/Hip Exercises: Aerobic   Nustep Level 5 x 7 minutes, cues at first about the knee going out      Knee/Hip Exercises: Standing   Hip Abduction Both;1 set;10 reps    Abduction Limitations 3# very difficult for her    Other Standing Knee Exercises 4" toe toches using the FWW      Knee/Hip Exercises: Seated   Long Arc Quad Both;3 sets;10 reps    Other Seated Knee/Hip Exercises 5# marches    Hamstring Curl Both;3 sets;10 reps  Hamstring Limitations green tband                    PT Short Term Goals - 06/18/20 1105      PT SHORT TERM GOAL #1   Title independent with initial HEP    Status Partially Met             PT Long Term Goals - 07/16/20 1147      PT LONG TERM GOAL #1   Title walk with hand hold assist x 100 feet    Status On-going      PT LONG TERM GOAL #2   Title increased right LE strength to 4-/5    Status Partially Met      PT LONG TERM GOAL #3   Title walk 100 feet with SBA using the FWW    Status Partially Met      PT LONG TERM GOAL #4   Title transfer independently with set up    Status On-going                 Plan - 07/16/20 1132    Clinical Impression Statement Tried to simulate the stone foyer at her house with her having to lift the walker up and over a cane lying on the floor, she did well with this but hte left shoulder really pops a lot and is painful.  She had a lot of difficulty walking prior to [place] doing any exercises    PT Next Visit Plan may  try her lifting her walker some to simulate the foyer walking    Consulted and Agree with Plan of Care Patient           Patient will benefit from skilled therapeutic intervention in order to improve the following deficits and impairments:  Abnormal gait, Cardiopulmonary status limiting activity, Decreased activity tolerance, Decreased balance, Decreased mobility, Decreased strength, Decreased coordination, Decreased range of motion, Difficulty walking, Increased muscle spasms, Pain  Visit Diagnosis: Difficulty in walking, not elsewhere classified  Late effects of CVA (cerebrovascular accident)  Muscle weakness (generalized)  Hemiplegia and hemiparesis following cerebral infarction affecting left non-dominant side (HCC)  Ataxia, unspecified  Chronic left shoulder pain  Stiffness of left knee, not elsewhere classified  Pain in left ankle and joints of left foot  Acute pain of left knee     Problem List Patient Active Problem List   Diagnosis Date Noted   Presbycusis of both ears 03/08/2020   Mixed stress and urge urinary incontinence 12/02/2019   Macrocytosis 12/01/2019   Nutritional anemia 12/01/2019   S/P total knee replacement 01/27/2019   Recurrent left knee instability 07/05/2018   Respiratory failure with hypoxia ([place]) 08/30/2017   Hypoxemia    Heart failure with preserved ejection fraction (HCC), Grade 3 diastolic dysfunction 76/81/1572   PAF (paroxysmal atrial fibrillation) ([place])    Dyspnea 03/19/2017   Encounter for preventive health examination 02/17/2016   Sensorineural hearing loss (SNHL), bilateral 01/26/2016   Morbid obesity ([place]) 06/17/2015   Hypomagnesemia 04/24/2014   Hemiparesis affecting left side as late effect of cerebrovascular accident ([place]) 04/24/2014   Nontraumatic cerebral hemorrhage ([place]) 04/30/2012   DM (diabetes mellitus) with complications ([place]) 62/10/5595   OSTEOPENIA 01/21/2009   UNSPECIFIED VITAMIN D DEFICIENCY  11/19/2007   ESSENTIAL HYPERTENSION, BENIGN 11/19/2007   HYPERCHOLESTEROLEMIA 10/25/2006   GASTROESOPHAGEAL REFLUX, NO ESOPHAGITIS 10/25/2006   DIVERTICULOSIS OF COLON 10/25/2006   Osteoarthritis 10/25/2006   CERVICAL SPINE DISORDER, NOS 10/25/2006  Sumner Boast., PT 07/16/2020, 11:48 AM  [facility]. [place], [place], [address] Phone: [phone]   Fax:  [phone]  Name: KINSLEA FRANCES MRN: [id number] Date of Birth: 1941/06/25

## 2020-07-19 ENCOUNTER — Other Ambulatory Visit: Payer: Self-pay

## 2020-07-19 ENCOUNTER — Encounter: Payer: Self-pay | Admitting: Physical Therapy

## 2020-07-19 ENCOUNTER — Ambulatory Visit: Payer: Medicare Other | Admitting: Physical Therapy

## 2020-07-19 DIAGNOSIS — M6281 Muscle weakness (generalized): Secondary | ICD-10-CM

## 2020-07-19 DIAGNOSIS — I69354 Hemiplegia and hemiparesis following cerebral infarction affecting left non-dominant side: Secondary | ICD-10-CM

## 2020-07-19 DIAGNOSIS — M25512 Pain in left shoulder: Secondary | ICD-10-CM

## 2020-07-19 DIAGNOSIS — M25572 Pain in left ankle and joints of left foot: Secondary | ICD-10-CM

## 2020-07-19 DIAGNOSIS — R262 Difficulty in walking, not elsewhere classified: Secondary | ICD-10-CM | POA: Diagnosis not present

## 2020-07-19 DIAGNOSIS — R270 Ataxia, unspecified: Secondary | ICD-10-CM

## 2020-07-19 DIAGNOSIS — I6990 Unspecified sequelae of unspecified cerebrovascular disease: Secondary | ICD-10-CM

## 2020-07-19 DIAGNOSIS — M25662 Stiffness of left knee, not elsewhere classified: Secondary | ICD-10-CM

## 2020-07-19 DIAGNOSIS — G8929 Other chronic pain: Secondary | ICD-10-CM

## 2020-07-19 NOTE — Therapy (Signed)
[place]. [place], [place], [address] Phone: [phone]   Fax:  [phone]  Physical Therapy Treatment  Patient Details  Name: Karen [place] MRN: [id number] Date of Birth: 05-27-1941 Referring Provider (PT): Hensel   Encounter Date: 07/19/2020   PT End of Session - 07/19/20 1144    Visit Number 12    Date for PT Re-Evaluation 08/10/20    PT Start Time 1100    PT Stop Time 1144    PT Time Calculation (min) 44 min    Activity Tolerance Patient tolerated treatment well    Behavior During Therapy [facility] for tasks assessed/performed           Past Medical History:  Diagnosis Date  . Acute cystitis without hematuria   . Acute diastolic CHF (congestive heart failure) ([place])   . Arthritis   . Dyspnea   . Dysrhythmia   . Fever of unknown origin 03/19/2017  . Hyperlipidemia   . Hypertension    denies at preop  . Multifocal pneumonia   . Neuromuscular disorder ([place])    neuropathy left arm and foot  . Osteopenia   . Paralysis ([place])    partial left side from CVA   . Persistent atrial fibrillation ([place])   . PONV (postoperative nausea and vomiting)   . Pre-diabetes   . Stroke [facility]) 2013   hemmorahgic    Past Surgical History:  Procedure Laterality Date  . ANKLE SURGERY    . APPENDECTOMY    . CHOLECYSTECTOMY    . HERNIA REPAIR     Esophagus  . JOINT REPLACEMENT     total- right partial- left  . MASTECTOMY PARTIAL / LUMPECTOMY  2012   left  . ORIF ANKLE FRACTURE Left 07/20/2018   Procedure: OPEN REDUCTION INTERNAL FIXATION (ORIF) ANKLE FRACTURE;  Surgeon: Wylene Simmer, MD;  Location: [place];  Service: Orthopedics;  Laterality: Left;  . TOTAL KNEE ARTHROPLASTY Left 01/27/2019   Procedure: TOTAL KNEE ARTHROPLASTY;  Surgeon: Vickey Huger, MD;  [facility];  Service: Orthopedics;  Laterality: Left;    There were no vitals filed for this visit.   Subjective Assessment - 07/19/20 1107    Subjective My  stomach was upset all weekend, feel tired and sluggish    Currently in Pain? No/denies                             [facility] Adult PT Treatment/Exercise - 07/19/20 0001      Ambulation/Gait   Gait Comments with FWW SBA 2x 100' then finished with 70 feet.        Knee/Hip Exercises: Aerobic   Nustep Level 5 x 7 minutes, doing better holding her knee in with less cues      Knee/Hip Exercises: Seated   Other Seated Knee/Hip Exercises green tband ankle PF/DF, scapular retraction and shrugs, tried some table slides for left arm mm activation but this was painful in the left shoulder    Hamstring Curl Both;3 sets;10 reps    Hamstring Limitations green tband                    PT Short Term Goals - 06/18/20 1105      PT SHORT TERM GOAL #1   Title independent with initial HEP    Status Partially Met             PT Long Term Goals -  07/16/20 1147      PT LONG TERM GOAL #1   Title walk with hand hold assist x 100 feet    Status On-going      PT LONG TERM GOAL #2   Title increased right LE strength to 4-/5    Status Partially Met      PT LONG TERM GOAL #3   Title walk 100 feet with SBA using the FWW    Status Partially Met      PT LONG TERM GOAL #4   Title transfer independently with set up    Status On-going                 Plan - 07/19/20 1144    Clinical Impression Statement Today was the fartherest we have attempted to walk, she was very fatigued at the end but did well, slight worry about the distance once she is tired but made it.  she reports at home that she walked better in the home and the stone foyer did not bother her as much over the weekend.  Tried some exercies with the left UE but the shoudler was very painful with a lot of popping.    PT Next Visit Plan try some increase shoulder exercises if not too painful    Consulted and Agree with Plan of Care Patient           Patient will benefit from skilled therapeutic  intervention in order to improve the following deficits and impairments:  Abnormal gait, Cardiopulmonary status limiting activity, Decreased activity tolerance, Decreased balance, Decreased mobility, Decreased strength, Decreased coordination, Decreased range of motion, Difficulty walking, Increased muscle spasms, Pain  Visit Diagnosis: Difficulty in walking, not elsewhere classified  Late effects of CVA (cerebrovascular accident)  Muscle weakness (generalized)  Hemiplegia and hemiparesis following cerebral infarction affecting left non-dominant side (HCC)  Ataxia, unspecified  Chronic left shoulder pain  Stiffness of left knee, not elsewhere classified  Pain in left ankle and joints of left foot     Problem List Patient Active Problem List   Diagnosis Date Noted  . Presbycusis of both ears 03/08/2020  . Mixed stress and urge urinary incontinence 12/02/2019  . Macrocytosis 12/01/2019  . Nutritional anemia 12/01/2019  . S/P total knee replacement 01/27/2019  . Recurrent left knee instability 07/05/2018  . Respiratory failure with hypoxia ([place]) 08/30/2017  . Hypoxemia   . Heart failure with preserved ejection fraction ([place]), Grade 3 diastolic dysfunction 16/05/9603  . PAF (paroxysmal atrial fibrillation) ([place])   . Dyspnea 03/19/2017  . Encounter for preventive health examination 02/17/2016  . Sensorineural hearing loss (SNHL), bilateral 01/26/2016  . Morbid obesity ([place]) 06/17/2015  . Hypomagnesemia 04/24/2014  . Hemiparesis affecting left side as late effect of cerebrovascular accident ([place]) 04/24/2014  . Nontraumatic cerebral hemorrhage ([place]) 04/30/2012  . DM (diabetes mellitus) with complications ([place]) 54/04/8118  . OSTEOPENIA 01/21/2009  . UNSPECIFIED VITAMIN D DEFICIENCY 11/19/2007  . ESSENTIAL HYPERTENSION, BENIGN 11/19/2007  . HYPERCHOLESTEROLEMIA 10/25/2006  . GASTROESOPHAGEAL REFLUX, NO ESOPHAGITIS 10/25/2006  . DIVERTICULOSIS OF COLON 10/25/2006  .  Osteoarthritis 10/25/2006  . CERVICAL SPINE DISORDER, NOS 10/25/2006    Sumner Boast., PT 07/19/2020, 11:48 AM  [place]. [place], [place], [address] Phone: [phone]   Fax:  [phone]  Name: Karen [place] MRN: [id number] Date of Birth: 02-23-1941

## 2020-07-21 ENCOUNTER — Encounter: Payer: Self-pay | Admitting: Physical Therapy

## 2020-07-21 ENCOUNTER — Ambulatory Visit: Payer: Medicare Other | Admitting: Physical Therapy

## 2020-07-21 ENCOUNTER — Other Ambulatory Visit: Payer: Self-pay

## 2020-07-21 DIAGNOSIS — M25572 Pain in left ankle and joints of left foot: Secondary | ICD-10-CM

## 2020-07-21 DIAGNOSIS — M25662 Stiffness of left knee, not elsewhere classified: Secondary | ICD-10-CM

## 2020-07-21 DIAGNOSIS — I69354 Hemiplegia and hemiparesis following cerebral infarction affecting left non-dominant side: Secondary | ICD-10-CM

## 2020-07-21 DIAGNOSIS — M25512 Pain in left shoulder: Secondary | ICD-10-CM

## 2020-07-21 DIAGNOSIS — R270 Ataxia, unspecified: Secondary | ICD-10-CM

## 2020-07-21 DIAGNOSIS — R262 Difficulty in walking, not elsewhere classified: Secondary | ICD-10-CM

## 2020-07-21 DIAGNOSIS — M6281 Muscle weakness (generalized): Secondary | ICD-10-CM

## 2020-07-21 DIAGNOSIS — I6990 Unspecified sequelae of unspecified cerebrovascular disease: Secondary | ICD-10-CM

## 2020-07-21 DIAGNOSIS — G8929 Other chronic pain: Secondary | ICD-10-CM

## 2020-07-21 NOTE — Therapy (Signed)
[place]. [place], [place], [address] Phone: [phone]   Fax:  [phone]  Physical Therapy Treatment  Patient Details  Name: Karen [place] MRN: [id number] Date of Birth: 07-17-1941 Referring Provider (PT): Hensel   Encounter Date: 07/21/2020   PT End of Session - 07/21/20 1056    Visit Number 13    Date for PT Re-Evaluation 08/10/20    PT Start Time 1014    PT Stop Time 1100    PT Time Calculation (min) 46 min    Activity Tolerance Patient tolerated treatment well    Behavior During Therapy [facility] for tasks assessed/performed           Past Medical History:  Diagnosis Date   Acute cystitis without hematuria    Acute diastolic CHF (congestive heart failure) (HCC)    Arthritis    Dyspnea    Dysrhythmia    Fever of unknown origin 03/19/2017   Hyperlipidemia    Hypertension    denies at preop   Multifocal pneumonia    Neuromuscular disorder ([place])    neuropathy left arm and foot   Osteopenia    Paralysis ([place])    partial left side from CVA    Persistent atrial fibrillation (HCC)    PONV (postoperative nausea and vomiting)    Pre-diabetes    Stroke [facility]) 2013   hemmorahgic    Past Surgical History:  Procedure Laterality Date   ANKLE SURGERY     APPENDECTOMY     CHOLECYSTECTOMY     HERNIA REPAIR     Esophagus   JOINT REPLACEMENT     total- right partial- left   MASTECTOMY PARTIAL / LUMPECTOMY  2012   left   ORIF ANKLE FRACTURE Left 07/20/2018   Procedure: OPEN REDUCTION INTERNAL FIXATION (ORIF) ANKLE FRACTURE;  Surgeon: Wylene Simmer, MD;  Location: [place];  Service: Orthopedics;  Laterality: Left;   TOTAL KNEE ARTHROPLASTY Left 01/27/2019   Procedure: TOTAL KNEE ARTHROPLASTY;  Surgeon: Vickey Huger, MD;  [facility];  Service: Orthopedics;  Laterality: Left;    There were no vitals filed for this visit.   Subjective Assessment - 07/21/20 1024    Subjective  Reports that her shoulders were very sore yesterday.  Just tired    Currently in Pain? No/denies                             [facility] Adult PT Treatment/Exercise - 07/21/20 0001      Ambulation/Gait   Gait Comments FWW SBA x 100'  then rest and 80 feet, then 2 x 40 feet      Knee/Hip Exercises: Aerobic   Nustep Level 5 x 7 minutes, doing better holding her knee in with less cues      Knee/Hip Exercises: Seated   Long Arc Quad Both;3 sets;10 reps    Long Arc Quad Weight 5 lbs.    Other Seated Knee/Hip Exercises green tband ankle PF/DF, scapular retraction and shrugs, tried some table slides for left arm mm activation but this was painful in the left shoulder    Hamstring Curl Both;3 sets;10 reps    Hamstring Limitations green tband                    PT Short Term Goals - 06/18/20 1105      PT SHORT TERM GOAL #1   Title independent with initial HEP  Status Partially Met             PT Long Term Goals - 07/21/20 1058      PT LONG TERM GOAL #1   Title walk with hand hold assist x 100 feet    Status On-going      PT LONG TERM GOAL #3   Title walk 100 feet with SBA using the FWW    Status Partially Met                 Plan - 07/21/20 1056    Clinical Impression Statement Patient reports that she was very fatigued after the walking on Monday, "had to take a nap", we worked on this more with a long walk and progressively shorter walks, as we go she does get more and more fatigued with shortness of breath and then she really struggles with safety, needing CGA and some help with transfers    PT Next Visit Plan gait    Consulted and Agree with Plan of Care Patient           Patient will benefit from skilled therapeutic intervention in order to improve the following deficits and impairments:  Abnormal gait, Cardiopulmonary status limiting activity, Decreased activity tolerance, Decreased balance, Decreased mobility, Decreased strength,  Decreased coordination, Decreased range of motion, Difficulty walking, Increased muscle spasms, Pain  Visit Diagnosis: Difficulty in walking, not elsewhere classified  Late effects of CVA (cerebrovascular accident)  Muscle weakness (generalized)  Hemiplegia and hemiparesis following cerebral infarction affecting left non-dominant side (HCC)  Ataxia, unspecified  Chronic left shoulder pain  Stiffness of left knee, not elsewhere classified  Pain in left ankle and joints of left foot     Problem List Patient Active Problem List   Diagnosis Date Noted   Presbycusis of both ears 03/08/2020   Mixed stress and urge urinary incontinence 12/02/2019   Macrocytosis 12/01/2019   Nutritional anemia 12/01/2019   S/P total knee replacement 01/27/2019   Recurrent left knee instability 07/05/2018   Respiratory failure with hypoxia ([place]) 08/30/2017   Hypoxemia    Heart failure with preserved ejection fraction (HCC), Grade 3 diastolic dysfunction 25/36/6440   PAF (paroxysmal atrial fibrillation) ([place])    Dyspnea 03/19/2017   Encounter for preventive health examination 02/17/2016   Sensorineural hearing loss (SNHL), bilateral 01/26/2016   Morbid obesity ([place]) 06/17/2015   Hypomagnesemia 04/24/2014   Hemiparesis affecting left side as late effect of cerebrovascular accident ([place]) 04/24/2014   Nontraumatic cerebral hemorrhage ([place]) 04/30/2012   DM (diabetes mellitus) with complications ([place]) 34/74/2595   OSTEOPENIA 01/21/2009   UNSPECIFIED VITAMIN D DEFICIENCY 11/19/2007   ESSENTIAL HYPERTENSION, BENIGN 11/19/2007   HYPERCHOLESTEROLEMIA 10/25/2006   GASTROESOPHAGEAL REFLUX, NO ESOPHAGITIS 10/25/2006   DIVERTICULOSIS OF COLON 10/25/2006   Osteoarthritis 10/25/2006   CERVICAL SPINE DISORDER, NOS 10/25/2006    Sumner Boast., PT 07/21/2020, 10:58 AM  [place]. [place], [place],  [address] Phone: [phone]   Fax:  [phone]  Name: KHALANI NOVOA MRN: [id number] Date of Birth: Oct 03, 1940

## 2020-07-26 ENCOUNTER — Ambulatory Visit: Payer: Medicare Other | Admitting: Physical Therapy

## 2020-07-26 ENCOUNTER — Encounter: Payer: Self-pay | Admitting: Physical Therapy

## 2020-07-26 ENCOUNTER — Other Ambulatory Visit: Payer: Self-pay

## 2020-07-26 DIAGNOSIS — M25662 Stiffness of left knee, not elsewhere classified: Secondary | ICD-10-CM

## 2020-07-26 DIAGNOSIS — M25572 Pain in left ankle and joints of left foot: Secondary | ICD-10-CM

## 2020-07-26 DIAGNOSIS — R270 Ataxia, unspecified: Secondary | ICD-10-CM

## 2020-07-26 DIAGNOSIS — G8929 Other chronic pain: Secondary | ICD-10-CM

## 2020-07-26 DIAGNOSIS — M25562 Pain in left knee: Secondary | ICD-10-CM

## 2020-07-26 DIAGNOSIS — R262 Difficulty in walking, not elsewhere classified: Secondary | ICD-10-CM

## 2020-07-26 DIAGNOSIS — M6281 Muscle weakness (generalized): Secondary | ICD-10-CM

## 2020-07-26 DIAGNOSIS — I6990 Unspecified sequelae of unspecified cerebrovascular disease: Secondary | ICD-10-CM

## 2020-07-26 DIAGNOSIS — I69354 Hemiplegia and hemiparesis following cerebral infarction affecting left non-dominant side: Secondary | ICD-10-CM

## 2020-07-26 NOTE — Therapy (Signed)
[place]. [place], [place], [address] Phone: [phone]   Fax:  [phone]  Physical Therapy Treatment  Patient Details  Name: Karen [place] MRN: [id number] Date of Birth: [birth year] Referring Provider (PT): Hensel   Encounter Date: 07/26/2020   PT End of Session - 07/26/20 1153    Visit Number 14    Date for PT Re-Evaluation 08/10/20    PT Start Time 1100    PT Stop Time 1144    PT Time Calculation (min) 44 min    Activity Tolerance Patient limited by pain    Behavior During Therapy [facility] for tasks assessed/performed           Past Medical History:  Diagnosis Date  . Acute cystitis without hematuria   . Acute diastolic CHF (congestive heart failure) ([place])   . Arthritis   . Dyspnea   . Dysrhythmia   . Fever of unknown origin 03/19/2017  . Hyperlipidemia   . Hypertension    denies at preop  . Multifocal pneumonia   . Neuromuscular disorder ([place])    neuropathy left arm and foot  . Osteopenia   . Paralysis ([place])    partial left side from CVA   . Persistent atrial fibrillation ([place])   . PONV (postoperative nausea and vomiting)   . Pre-diabetes   . Stroke [facility]) 2013   hemmorahgic    Past Surgical History:  Procedure Laterality Date  . ANKLE SURGERY    . APPENDECTOMY    . CHOLECYSTECTOMY    . HERNIA REPAIR     Esophagus  . JOINT REPLACEMENT     total- right partial- left  . MASTECTOMY PARTIAL / LUMPECTOMY  2012   left  . ORIF ANKLE FRACTURE Left 07/20/2018   Procedure: OPEN REDUCTION INTERNAL FIXATION (ORIF) ANKLE FRACTURE;  Surgeon: Wylene Simmer, MD;  Location: [place];  Service: Orthopedics;  Laterality: Left;  . TOTAL KNEE ARTHROPLASTY Left 01/27/2019   Procedure: TOTAL KNEE ARTHROPLASTY;  Surgeon: Vickey Huger, MD;  [facility];  Service: Orthopedics;  Laterality: Left;    There were no vitals filed for this visit.   Subjective Assessment - 07/26/20 1146    Subjective REports left  arm and shoulder pain all weekend, "very tight and hand hurts"    Currently in Pain? Yes    Pain Score 7     Pain Location Shoulder    Pain Orientation Left    Pain Descriptors / Indicators Aching;Sore;Spasm;Tightness    Pain Relieving Factors walking                             [facility] Adult PT Treatment/Exercise - 07/26/20 0001      Ambulation/Gait   Gait Comments FWW CGA, x70' and then 2x 50', she really has a lot of shoulder mms that involuntarily contract and cause elevation of the shoulder and this may be what is causing the increaed pain      Knee/Hip Exercises: Aerobic   Nustep level 5 x 8 minutes      Knee/Hip Exercises: Standing   Knee Flexion Both;2 sets;10 reps    Knee Flexion Limitations 2.5#    Hip Abduction Both;1 set;10 reps      Knee/Hip Exercises: Seated   Long Arc Quad Both;3 sets;10 reps    Long Arc Quad Weight 5 lbs.    Other Seated Knee/Hip Exercises green tband ankle PF/DF, left hip adduction  Hamstring Curl Both;3 sets;10 reps    Hamstring Limitations green tband      Manual Therapy   Manual therapy comments STM to the left hand and shoulder into the upper trap, some gentle stretches of the left hand, fingers and shoulder                    PT Short Term Goals - 06/18/20 1105      PT SHORT TERM GOAL #1   Title independent with initial HEP    Status Partially Met             PT Long Term Goals - 07/21/20 1058      PT LONG TERM GOAL #1   Title walk with hand hold assist x 100 feet    Status On-going      PT LONG TERM GOAL #3   Title walk 100 feet with SBA using the FWW    Status Partially Met                 Plan - 07/26/20 1154    Clinical Impression Statement we could not walk as much or as well today due to left hand and shoulder pain, she blamed the colder weather but she does have a lot of compensation that occurs in the left arm and shoulder with LE movements and other things like walking today  she was very tense in the shoulder and there was a lot more palpable popping.    PT Next Visit Plan Still really want to get her walking more and better due to her PLOF being very good with walking on her own    Consulted and Agree with Plan of Care Patient           Patient will benefit from skilled therapeutic intervention in order to improve the following deficits and impairments:  Abnormal gait, Cardiopulmonary status limiting activity, Decreased activity tolerance, Decreased balance, Decreased mobility, Decreased strength, Decreased coordination, Decreased range of motion, Difficulty walking, Increased muscle spasms, Pain  Visit Diagnosis: Difficulty in walking, not elsewhere classified  Late effects of CVA (cerebrovascular accident)  Muscle weakness (generalized)  Hemiplegia and hemiparesis following cerebral infarction affecting left non-dominant side (HCC)  Ataxia, unspecified  Chronic left shoulder pain  Stiffness of left knee, not elsewhere classified  Pain in left ankle and joints of left foot  Acute pain of left knee     Problem List Patient Active Problem List   Diagnosis Date Noted  . Presbycusis of both ears 03/08/2020  . Mixed stress and urge urinary incontinence 12/02/2019  . Macrocytosis 12/01/2019  . Nutritional anemia 12/01/2019  . S/P total knee replacement 01/27/2019  . Recurrent left knee instability 07/05/2018  . Respiratory failure with hypoxia ([place]) 08/30/2017  . Hypoxemia   . Heart failure with preserved ejection fraction ([place]), Grade 3 diastolic dysfunction 52/84/1324  . PAF (paroxysmal atrial fibrillation) ([place])   . Dyspnea 03/19/2017  . Encounter for preventive health examination 02/17/2016  . Sensorineural hearing loss (SNHL), bilateral 01/26/2016  . Morbid obesity ([place]) 06/17/2015  . Hypomagnesemia 04/24/2014  . Hemiparesis affecting left side as late effect of cerebrovascular accident ([place]) 04/24/2014  . Nontraumatic cerebral  hemorrhage ([place]) 04/30/2012  . DM (diabetes mellitus) with complications ([place]) 40/05/2724  . OSTEOPENIA 01/21/2009  . UNSPECIFIED VITAMIN D DEFICIENCY 11/19/2007  . ESSENTIAL HYPERTENSION, BENIGN 11/19/2007  . HYPERCHOLESTEROLEMIA 10/25/2006  . GASTROESOPHAGEAL REFLUX, NO ESOPHAGITIS 10/25/2006  . DIVERTICULOSIS OF COLON 10/25/2006  . Osteoarthritis 10/25/2006  .  CERVICAL SPINE DISORDER, NOS 10/25/2006    Sumner Boast., PT 07/26/2020, 11:56 AM  [place]. [place], [place], [address] Phone: [phone]   Fax:  [phone]  Name: VANETTE NOGUCHI MRN: [id number] Date of Birth: 07-09-1941

## 2020-07-30 ENCOUNTER — Ambulatory Visit: Payer: Medicare Other | Attending: Family Medicine | Admitting: Physical Therapy

## 2020-07-30 ENCOUNTER — Other Ambulatory Visit: Payer: Self-pay

## 2020-07-30 ENCOUNTER — Encounter: Payer: Self-pay | Admitting: Physical Therapy

## 2020-07-30 DIAGNOSIS — M25512 Pain in left shoulder: Secondary | ICD-10-CM | POA: Diagnosis present

## 2020-07-30 DIAGNOSIS — R278 Other lack of coordination: Secondary | ICD-10-CM | POA: Insufficient documentation

## 2020-07-30 DIAGNOSIS — M25572 Pain in left ankle and joints of left foot: Secondary | ICD-10-CM | POA: Insufficient documentation

## 2020-07-30 DIAGNOSIS — R270 Ataxia, unspecified: Secondary | ICD-10-CM | POA: Diagnosis present

## 2020-07-30 DIAGNOSIS — I6990 Unspecified sequelae of unspecified cerebrovascular disease: Secondary | ICD-10-CM | POA: Diagnosis present

## 2020-07-30 DIAGNOSIS — M25662 Stiffness of left knee, not elsewhere classified: Secondary | ICD-10-CM | POA: Insufficient documentation

## 2020-07-30 DIAGNOSIS — R293 Abnormal posture: Secondary | ICD-10-CM | POA: Insufficient documentation

## 2020-07-30 DIAGNOSIS — R262 Difficulty in walking, not elsewhere classified: Secondary | ICD-10-CM | POA: Diagnosis present

## 2020-07-30 DIAGNOSIS — M6281 Muscle weakness (generalized): Secondary | ICD-10-CM | POA: Diagnosis present

## 2020-07-30 DIAGNOSIS — I69354 Hemiplegia and hemiparesis following cerebral infarction affecting left non-dominant side: Secondary | ICD-10-CM | POA: Diagnosis present

## 2020-07-30 DIAGNOSIS — G8929 Other chronic pain: Secondary | ICD-10-CM | POA: Diagnosis present

## 2020-07-30 NOTE — Therapy (Signed)
[place]. [place], [place], [address] Phone: [phone]   Fax:  [phone]  Physical Therapy Treatment  Patient Details  Name: Karen [place] MRN: [id number] Date of Birth: [birth year] Referring Provider (PT): Hensel   Encounter Date: 07/30/2020   PT End of Session - 07/30/20 1137    Visit Number 15    Date for PT Re-Evaluation 08/10/20    PT Start Time 1054    PT Stop Time 1135    PT Time Calculation (min) 41 min    Activity Tolerance Patient tolerated treatment well    Behavior During Therapy [facility] for tasks assessed/performed           Past Medical History:  Diagnosis Date  . Acute cystitis without hematuria   . Acute diastolic CHF (congestive heart failure) ([place])   . Arthritis   . Dyspnea   . Dysrhythmia   . Fever of unknown origin 03/19/2017  . Hyperlipidemia   . Hypertension    denies at preop  . Multifocal pneumonia   . Neuromuscular disorder ([place])    neuropathy left arm and foot  . Osteopenia   . Paralysis ([place])    partial left side from CVA   . Persistent atrial fibrillation ([place])   . PONV (postoperative nausea and vomiting)   . Pre-diabetes   . Stroke [facility]) 2013   hemmorahgic    Past Surgical History:  Procedure Laterality Date  . ANKLE SURGERY    . APPENDECTOMY    . CHOLECYSTECTOMY    . HERNIA REPAIR     Esophagus  . JOINT REPLACEMENT     total- right partial- left  . MASTECTOMY PARTIAL / LUMPECTOMY  2012   left  . ORIF ANKLE FRACTURE Left 07/20/2018   Procedure: OPEN REDUCTION INTERNAL FIXATION (ORIF) ANKLE FRACTURE;  Surgeon: Wylene Simmer, MD;  Location: [place];  Service: Orthopedics;  Laterality: Left;  . TOTAL KNEE ARTHROPLASTY Left 01/27/2019   Procedure: TOTAL KNEE ARTHROPLASTY;  Surgeon: Vickey Huger, MD;  [facility];  Service: Orthopedics;  Laterality: Left;    There were no vitals filed for this visit.   Subjective Assessment - 07/30/20 1101    Subjective  Reports shoulders feel better, hand still hurts    Currently in Pain? Yes    Pain Score 7     Pain Location Hand    Pain Orientation Left    Pain Descriptors / Indicators Sore    Aggravating Factors  unsure why    Pain Relieving Factors heat                             [facility] Adult PT Treatment/Exercise - 07/30/20 0001      Ambulation/Gait   Gait Comments FWW, CGA 115' x 2, 50' x 2      Knee/Hip Exercises: Aerobic   Nustep level 5 x 8 minutes      Manual Therapy   Manual therapy comments STM to the left hand and shoulder into the upper trap, some gentle stretches of the left hand, fingers and shoulder                    PT Short Term Goals - 06/18/20 1105      PT SHORT TERM GOAL #1   Title independent with initial HEP    Status Partially Met  PT Long Term Goals - 07/30/20 1143      PT LONG TERM GOAL #1   Title walk with hand hold assist x 100 feet    Status Partially Met                 Plan - 07/30/20 1138    Clinical Impression Statement Patient with some increased pain in the hand today, less pain with the shoulders, I did much more walking with her, The first short walk and first long walk were good, with a few cues for posture and to decrease shoulder elevation.  By the second long walk she was very tired and could not keep the shoulders down and had some increased hand and shoulder pain.  I consulted with the OT today about the arm, Karen [place] has had OT about 8 years ago with some bracing and splinting but not since, She has spasticity with movements of right arm and LEs in the left UE and has laxity in the shoulder joint.  We will need to ask MD about a reeferral to OT    PT Next Visit Plan Still really want to get her walking more and better due to her PLOF being very good with walking on her own    Consulted and Agree with Plan of Care Patient           Patient will benefit from skilled therapeutic intervention in  order to improve the following deficits and impairments:  Abnormal gait, Cardiopulmonary status limiting activity, Decreased activity tolerance, Decreased balance, Decreased mobility, Decreased strength, Decreased coordination, Decreased range of motion, Difficulty walking, Increased muscle spasms, Pain  Visit Diagnosis: Difficulty in walking, not elsewhere classified  Late effects of CVA (cerebrovascular accident)  Muscle weakness (generalized)  Hemiplegia and hemiparesis following cerebral infarction affecting left non-dominant side (HCC)  Ataxia, unspecified  Chronic left shoulder pain  Stiffness of left knee, not elsewhere classified  Pain in left ankle and joints of left foot     Problem List Patient Active Problem List   Diagnosis Date Noted  . Presbycusis of both ears 03/08/2020  . Mixed stress and urge urinary incontinence 12/02/2019  . Macrocytosis 12/01/2019  . Nutritional anemia 12/01/2019  . S/P total knee replacement 01/27/2019  . Recurrent left knee instability 07/05/2018  . Respiratory failure with hypoxia ([place]) 08/30/2017  . Hypoxemia   . Heart failure with preserved ejection fraction ([place]), Grade 3 diastolic dysfunction 00/17/4944  . PAF (paroxysmal atrial fibrillation) ([place])   . Dyspnea 03/19/2017  . Encounter for preventive health examination 02/17/2016  . Sensorineural hearing loss (SNHL), bilateral 01/26/2016  . Morbid obesity ([place]) 06/17/2015  . Hypomagnesemia 04/24/2014  . Hemiparesis affecting left side as late effect of cerebrovascular accident ([place]) 04/24/2014  . Nontraumatic cerebral hemorrhage ([place]) 04/30/2012  . DM (diabetes mellitus) with complications ([place]) 96/75/9163  . OSTEOPENIA 01/21/2009  . UNSPECIFIED VITAMIN D DEFICIENCY 11/19/2007  . ESSENTIAL HYPERTENSION, BENIGN 11/19/2007  . HYPERCHOLESTEROLEMIA 10/25/2006  . GASTROESOPHAGEAL REFLUX, NO ESOPHAGITIS 10/25/2006  . DIVERTICULOSIS OF COLON 10/25/2006  . Osteoarthritis 10/25/2006   . CERVICAL SPINE DISORDER, NOS 10/25/2006    Sumner Boast., PT 07/30/2020, 11:44 AM  [place]. [place], [place], [address] Phone: [phone]   Fax:  [phone]  Name: KEISHLA OYER MRN: [id number] Date of Birth: Oct 01, 1940

## 2020-08-02 ENCOUNTER — Encounter: Payer: Self-pay | Admitting: Physical Therapy

## 2020-08-02 ENCOUNTER — Other Ambulatory Visit: Payer: Self-pay

## 2020-08-02 ENCOUNTER — Ambulatory Visit: Payer: Medicare Other | Admitting: Physical Therapy

## 2020-08-02 DIAGNOSIS — R262 Difficulty in walking, not elsewhere classified: Secondary | ICD-10-CM | POA: Diagnosis not present

## 2020-08-02 DIAGNOSIS — R270 Ataxia, unspecified: Secondary | ICD-10-CM

## 2020-08-02 DIAGNOSIS — M6281 Muscle weakness (generalized): Secondary | ICD-10-CM

## 2020-08-02 DIAGNOSIS — M25512 Pain in left shoulder: Secondary | ICD-10-CM

## 2020-08-02 DIAGNOSIS — M25572 Pain in left ankle and joints of left foot: Secondary | ICD-10-CM

## 2020-08-02 DIAGNOSIS — I6990 Unspecified sequelae of unspecified cerebrovascular disease: Secondary | ICD-10-CM

## 2020-08-02 DIAGNOSIS — I69354 Hemiplegia and hemiparesis following cerebral infarction affecting left non-dominant side: Secondary | ICD-10-CM

## 2020-08-02 DIAGNOSIS — M25662 Stiffness of left knee, not elsewhere classified: Secondary | ICD-10-CM

## 2020-08-02 DIAGNOSIS — G8929 Other chronic pain: Secondary | ICD-10-CM

## 2020-08-02 NOTE — Therapy (Signed)
[place]. [place], [place], [address] Phone: [phone]   Fax:  [phone]  Physical Therapy Treatment  Patient Details  Name: Karen Jackson MRN: [id number] Date of Birth: [birth year] Referring Provider (PT): Hensel   Encounter Date: 08/02/2020   PT End of Session - 08/02/20 1146    Visit Number 16    Date for PT Re-Evaluation 08/10/20    PT Start Time 1057    PT Stop Time 1143    PT Time Calculation (min) 46 min    Activity Tolerance Patient tolerated treatment well    Behavior During Therapy [facility] for tasks assessed/performed           Past Medical History:  Diagnosis Date  . Acute cystitis without hematuria   . Acute diastolic CHF (congestive heart failure) ([place])   . Arthritis   . Dyspnea   . Dysrhythmia   . Fever of unknown origin 03/19/2017  . Hyperlipidemia   . Hypertension    denies at preop  . Multifocal pneumonia   . Neuromuscular disorder ([place])    neuropathy left arm and foot  . Osteopenia   . Paralysis ([place])    partial left side from CVA   . Persistent atrial fibrillation ([place])   . PONV (postoperative nausea and vomiting)   . Pre-diabetes   . Stroke [facility]) 2013   hemmorahgic    Past Surgical History:  Procedure Laterality Date  . ANKLE SURGERY    . APPENDECTOMY    . CHOLECYSTECTOMY    . HERNIA REPAIR     Esophagus  . JOINT REPLACEMENT     total- right partial- left  . MASTECTOMY PARTIAL / LUMPECTOMY  2012   left  . ORIF ANKLE FRACTURE Left 07/20/2018   Procedure: OPEN REDUCTION INTERNAL FIXATION (ORIF) ANKLE FRACTURE;  Surgeon: Wylene Simmer, MD;  Location: [place];  Service: Orthopedics;  Laterality: Left;  . TOTAL KNEE ARTHROPLASTY Left 01/27/2019   Procedure: TOTAL KNEE ARTHROPLASTY;  Surgeon: Vickey Huger, MD;  [facility];  Service: Orthopedics;  Laterality: Left;    There were no vitals filed for this visit.   Subjective Assessment - 08/02/20 1108    Subjective  PAtient reports that she has been able to go to church more regularly due to feeling stronger and better with her transfers with her niece.    Currently in Pain? No/denies                             [facility] Adult PT Treatment/Exercise - 08/02/20 0001      Ambulation/Gait   Gait Comments FWW, 50', then 115' x 2      High Level Balance   High Level Balance Comments in parallel bars side stepping, then work on posture      Knee/Hip Exercises: Standing   Other Standing Knee Exercises 4" toe touches using the FWW      Knee/Hip Exercises: Seated   Long Arc Quad Both;3 sets;10 reps    Long Arc Quad Weight 5 lbs.    Other Seated Knee/Hip Exercises green tband ankle PF/DF, left hip adduction    Marching Left;2 sets;10 reps    Marching Weights 5 lbs.    Hamstring Curl Both;3 sets;10 reps    Hamstring Limitations green tband                    PT Short Term Goals -  06/18/20 1105      PT SHORT TERM GOAL #1   Title independent with initial HEP    Status Partially Met             PT Long Term Goals - 07/30/20 1143      PT LONG TERM GOAL #1   Title walk with hand hold assist x 100 feet    Status Partially Met                 Plan - 08/02/20 1147    Clinical Impression Statement Patient with less pain in the left shoulder, arm and hand today.  She reports that she has been more consistent going to church because she feels safer transfering with her niece.  She had some trouble with the toe touches today, not trusting hte left leg and had difficulty touching the right toe    PT Next Visit Plan Still really want to get her walking more and better due to her PLOF being very good with walking on her own, she would like to be able to walk into the church    Consulted and Agree with Plan of Care Patient           Patient will benefit from skilled therapeutic intervention in order to improve the following deficits and impairments:  Abnormal gait,  Cardiopulmonary status limiting activity, Decreased activity tolerance, Decreased balance, Decreased mobility, Decreased strength, Decreased coordination, Decreased range of motion, Difficulty walking, Increased muscle spasms, Pain  Visit Diagnosis: Difficulty in walking, not elsewhere classified  Late effects of CVA (cerebrovascular accident)  Muscle weakness (generalized)  Hemiplegia and hemiparesis following cerebral infarction affecting left non-dominant side (HCC)  Ataxia, unspecified  Chronic left shoulder pain  Stiffness of left knee, not elsewhere classified  Pain in left ankle and joints of left foot     Problem List Patient Active Problem List   Diagnosis Date Noted  . Presbycusis of both ears 03/08/2020  . Mixed stress and urge urinary incontinence 12/02/2019  . Macrocytosis 12/01/2019  . Nutritional anemia 12/01/2019  . S/P total knee replacement 01/27/2019  . Recurrent left knee instability 07/05/2018  . Respiratory failure with hypoxia ([place]) 08/30/2017  . Hypoxemia   . Heart failure with preserved ejection fraction ([place]), Grade 3 diastolic dysfunction 16/05/9603  . PAF (paroxysmal atrial fibrillation) ([place])   . Dyspnea 03/19/2017  . Encounter for preventive health examination 02/17/2016  . Sensorineural hearing loss (SNHL), bilateral 01/26/2016  . Morbid obesity ([place]) 06/17/2015  . Hypomagnesemia 04/24/2014  . Hemiparesis affecting left side as late effect of cerebrovascular accident ([place]) 04/24/2014  . Nontraumatic cerebral hemorrhage ([place]) 04/30/2012  . DM (diabetes mellitus) with complications ([place]) 54/04/8118  . OSTEOPENIA 01/21/2009  . UNSPECIFIED VITAMIN D DEFICIENCY 11/19/2007  . ESSENTIAL HYPERTENSION, BENIGN 11/19/2007  . HYPERCHOLESTEROLEMIA 10/25/2006  . GASTROESOPHAGEAL REFLUX, NO ESOPHAGITIS 10/25/2006  . DIVERTICULOSIS OF COLON 10/25/2006  . Osteoarthritis 10/25/2006  . CERVICAL SPINE DISORDER, NOS 10/25/2006    Sumner Boast., PT  08/02/2020, 11:50 AM  [place]. [place], [place], [address] Phone: [phone]   Fax:  [phone]  Name: Karen Jackson MRN: [id number] Date of Birth: [birth year]

## 2020-08-05 ENCOUNTER — Encounter: Payer: Self-pay | Admitting: Physical Therapy

## 2020-08-05 ENCOUNTER — Other Ambulatory Visit: Payer: Self-pay

## 2020-08-05 ENCOUNTER — Ambulatory Visit: Payer: Medicare Other | Admitting: Physical Therapy

## 2020-08-05 DIAGNOSIS — M25512 Pain in left shoulder: Secondary | ICD-10-CM

## 2020-08-05 DIAGNOSIS — R262 Difficulty in walking, not elsewhere classified: Secondary | ICD-10-CM | POA: Diagnosis not present

## 2020-08-05 DIAGNOSIS — M6281 Muscle weakness (generalized): Secondary | ICD-10-CM

## 2020-08-05 DIAGNOSIS — M25572 Pain in left ankle and joints of left foot: Secondary | ICD-10-CM

## 2020-08-05 DIAGNOSIS — I69354 Hemiplegia and hemiparesis following cerebral infarction affecting left non-dominant side: Secondary | ICD-10-CM

## 2020-08-05 DIAGNOSIS — I6990 Unspecified sequelae of unspecified cerebrovascular disease: Secondary | ICD-10-CM

## 2020-08-05 DIAGNOSIS — G8929 Other chronic pain: Secondary | ICD-10-CM

## 2020-08-05 DIAGNOSIS — R270 Ataxia, unspecified: Secondary | ICD-10-CM

## 2020-08-05 DIAGNOSIS — M25662 Stiffness of left knee, not elsewhere classified: Secondary | ICD-10-CM

## 2020-08-05 NOTE — Therapy (Signed)
[place]. [place], [place], [address] Phone: [phone]   Fax:  [phone]  Physical Therapy Treatment  Patient Details  Name: Karen Jackson MRN: [id number] Date of Birth: September 02, 1940 Referring Provider (PT): Hensel   Encounter Date: 08/05/2020   PT End of Session - 08/05/20 1148    Visit Number 17    Date for PT Re-Evaluation 08/10/20    PT Start Time 1057    PT Stop Time 1140    PT Time Calculation (min) 43 min    Activity Tolerance Patient tolerated treatment well;Patient limited by pain    Behavior During Therapy [facility] for tasks assessed/performed           Past Medical History:  Diagnosis Date  . Acute cystitis without hematuria   . Acute diastolic CHF (congestive heart failure) ([place])   . Arthritis   . Dyspnea   . Dysrhythmia   . Fever of unknown origin 03/19/2017  . Hyperlipidemia   . Hypertension    denies at preop  . Multifocal pneumonia   . Neuromuscular disorder ([place])    neuropathy left arm and foot  . Osteopenia   . Paralysis ([place])    partial left side from CVA   . Persistent atrial fibrillation ([place])   . PONV (postoperative nausea and vomiting)   . Pre-diabetes   . Stroke [facility]) 2013   hemmorahgic    Past Surgical History:  Procedure Laterality Date  . ANKLE SURGERY    . APPENDECTOMY    . CHOLECYSTECTOMY    . HERNIA REPAIR     Esophagus  . JOINT REPLACEMENT     total- right partial- left  . MASTECTOMY PARTIAL / LUMPECTOMY  2012   left  . ORIF ANKLE FRACTURE Left 07/20/2018   Procedure: OPEN REDUCTION INTERNAL FIXATION (ORIF) ANKLE FRACTURE;  Surgeon: Wylene Simmer, MD;  Location: [place];  Service: Orthopedics;  Laterality: Left;  . TOTAL KNEE ARTHROPLASTY Left 01/27/2019   Procedure: TOTAL KNEE ARTHROPLASTY;  Surgeon: Vickey Huger, MD;  [facility];  Service: Orthopedics;  Laterality: Left;    There were no vitals filed for this visit.   Subjective Assessment - 08/05/20  1132    Subjective Patient reports that her left arm and shoulder have benn feeling better until today now hurting again.    Currently in Pain? Yes    Pain Score 5     Pain Location Shoulder    Pain Orientation Left    Pain Descriptors / Indicators Sore    Aggravating Factors  walking with the walker increased the pain                             [facility] Adult PT Treatment/Exercise - 08/05/20 0001      Ambulation/Gait   Gait Comments able to do shorter walks 50' without issue, but does seem to have a little more spasticity today with walking      Knee/Hip Exercises: Standing   Other Standing Knee Exercises put a cane down in front of her walker and had her practice lifting the front wheels of the walker up and over the cane and then back, so she can balance and negotiate different obstacles that may arise    Other Standing Knee Exercises 4" toe touches using the FWW      Knee/Hip Exercises: Seated   Other Seated Knee/Hip Exercises green tband ankle PF/DF, left hip  adduction    Hamstring Curl Both;3 sets;10 reps      Manual Therapy   Manual therapy comments STM to the left hand and shoulder into the upper trap, some gentle stretches of the left hand, fingers and shoulder                    PT Short Term Goals - 06/18/20 1105      PT SHORT TERM GOAL #1   Title independent with initial HEP    Status Partially Met             PT Long Term Goals - 08/05/20 1151      PT LONG TERM GOAL #3   Title walk 100 feet with SBA using the FWW      PT LONG TERM GOAL #4   Title transfer independently with set up    Status Partially Met                 Plan - 08/05/20 1149    Clinical Impression Statement Patient with increased left shoulder, arm and hand pain today, we could not walk as far as the arm was hurting too much, she does tense up the left arm with activity and could not get it to relax with verbal or tactile cues today.  She did well with  lifting wheels over the cane.  She reported less pain after STM.  We have called to get order for OT    PT Next Visit Plan she will be evaluated for OT next week to see if there is anything that they can do to help with the arm    Consulted and Agree with Plan of Care Patient           Patient will benefit from skilled therapeutic intervention in order to improve the following deficits and impairments:  Abnormal gait,Cardiopulmonary status limiting activity,Decreased activity tolerance,Decreased balance,Decreased mobility,Decreased strength,Decreased coordination,Decreased range of motion,Difficulty walking,Increased muscle spasms,Pain  Visit Diagnosis: Difficulty in walking, not elsewhere classified  Late effects of CVA (cerebrovascular accident)  Muscle weakness (generalized)  Hemiplegia and hemiparesis following cerebral infarction affecting left non-dominant side (HCC)  Ataxia, unspecified  Chronic left shoulder pain  Stiffness of left knee, not elsewhere classified  Pain in left ankle and joints of left foot     Problem List Patient Active Problem List   Diagnosis Date Noted  . Presbycusis of both ears 03/08/2020  . Mixed stress and urge urinary incontinence 12/02/2019  . Macrocytosis 12/01/2019  . Nutritional anemia 12/01/2019  . S/P total knee replacement 01/27/2019  . Recurrent left knee instability 07/05/2018  . Respiratory failure with hypoxia ([place]) 08/30/2017  . Hypoxemia   . Heart failure with preserved ejection fraction ([place]), Grade 3 diastolic dysfunction 32/99/2426  . PAF (paroxysmal atrial fibrillation) ([place])   . Dyspnea 03/19/2017  . Encounter for preventive health examination 02/17/2016  . Sensorineural hearing loss (SNHL), bilateral 01/26/2016  . Morbid obesity ([place]) 06/17/2015  . Hypomagnesemia 04/24/2014  . Hemiparesis affecting left side as late effect of cerebrovascular accident ([place]) 04/24/2014  . Nontraumatic cerebral hemorrhage ([place])  04/30/2012  . DM (diabetes mellitus) with complications ([place]) 83/41/9622  . OSTEOPENIA 01/21/2009  . UNSPECIFIED VITAMIN D DEFICIENCY 11/19/2007  . ESSENTIAL HYPERTENSION, BENIGN 11/19/2007  . HYPERCHOLESTEROLEMIA 10/25/2006  . GASTROESOPHAGEAL REFLUX, NO ESOPHAGITIS 10/25/2006  . DIVERTICULOSIS OF COLON 10/25/2006  . Osteoarthritis 10/25/2006  . CERVICAL SPINE DISORDER, NOS 10/25/2006    Sumner Boast., PT 08/05/2020, 11:52 AM  [place]. [place], [place], [address] Phone: [phone]   Fax:  [phone]  Name: Karen Jackson MRN: [id number] Date of Birth: [birth year]

## 2020-08-09 ENCOUNTER — Ambulatory Visit: Payer: Medicare Other | Admitting: Occupational Therapy

## 2020-08-09 ENCOUNTER — Encounter: Payer: Self-pay | Admitting: Physical Therapy

## 2020-08-09 ENCOUNTER — Encounter: Payer: Self-pay | Admitting: Occupational Therapy

## 2020-08-09 ENCOUNTER — Other Ambulatory Visit: Payer: Self-pay

## 2020-08-09 ENCOUNTER — Ambulatory Visit: Payer: Medicare Other | Admitting: Physical Therapy

## 2020-08-09 DIAGNOSIS — M6281 Muscle weakness (generalized): Secondary | ICD-10-CM

## 2020-08-09 DIAGNOSIS — R278 Other lack of coordination: Secondary | ICD-10-CM

## 2020-08-09 DIAGNOSIS — R293 Abnormal posture: Secondary | ICD-10-CM

## 2020-08-09 DIAGNOSIS — R262 Difficulty in walking, not elsewhere classified: Secondary | ICD-10-CM

## 2020-08-09 DIAGNOSIS — I69354 Hemiplegia and hemiparesis following cerebral infarction affecting left non-dominant side: Secondary | ICD-10-CM

## 2020-08-09 DIAGNOSIS — R270 Ataxia, unspecified: Secondary | ICD-10-CM

## 2020-08-09 DIAGNOSIS — M25662 Stiffness of left knee, not elsewhere classified: Secondary | ICD-10-CM

## 2020-08-09 DIAGNOSIS — M25572 Pain in left ankle and joints of left foot: Secondary | ICD-10-CM

## 2020-08-09 DIAGNOSIS — I6990 Unspecified sequelae of unspecified cerebrovascular disease: Secondary | ICD-10-CM

## 2020-08-09 DIAGNOSIS — M25512 Pain in left shoulder: Secondary | ICD-10-CM

## 2020-08-09 NOTE — Therapy (Signed)
[place]. [place], [place], [address] Phone: [phone]   Fax:  [phone]  Physical Therapy Treatment  Patient Details  Name: Karen Jackson MRN: [id number] Date of Birth: [birth year] Referring Provider (PT): Hensel   Encounter Date: 08/09/2020   PT End of Session - 08/09/20 1337    Visit Number 18    Date for PT Re-Evaluation 08/10/20    PT Start Time 1058    PT Stop Time 1140    PT Time Calculation (min) 42 min    Activity Tolerance Patient tolerated treatment well;Patient limited by pain    Behavior During Therapy [facility] for tasks assessed/performed           Past Medical History:  Diagnosis Date  . Acute cystitis without hematuria   . Acute diastolic CHF (congestive heart failure) ([place])   . Arthritis   . Dyspnea   . Dysrhythmia   . Fever of unknown origin 03/19/2017  . Hyperlipidemia   . Hypertension    denies at preop  . Multifocal pneumonia   . Neuromuscular disorder ([place])    neuropathy left arm and foot  . Osteopenia   . Paralysis ([place])    partial left side from CVA   . Persistent atrial fibrillation ([place])   . PONV (postoperative nausea and vomiting)   . Pre-diabetes   . Stroke [facility]) 2013   hemmorahgic    Past Surgical History:  Procedure Laterality Date  . ANKLE SURGERY    . APPENDECTOMY    . CHOLECYSTECTOMY    . HERNIA REPAIR     Esophagus  . JOINT REPLACEMENT     total- right partial- left  . MASTECTOMY PARTIAL / LUMPECTOMY  2012   left  . ORIF ANKLE FRACTURE Left 07/20/2018   Procedure: OPEN REDUCTION INTERNAL FIXATION (ORIF) ANKLE FRACTURE;  Surgeon: Wylene Simmer, MD;  Location: [place];  Service: Orthopedics;  Laterality: Left;  . TOTAL KNEE ARTHROPLASTY Left 01/27/2019   Procedure: TOTAL KNEE ARTHROPLASTY;  Surgeon: Vickey Huger, MD;  [facility];  Service: Orthopedics;  Laterality: Left;    There were no vitals filed for this visit.   Subjective Assessment - 08/09/20  1105    Subjective Patient saw OT right before this treatment.  She reports pain in the left shoulder    Currently in Pain? Yes    Pain Score 6     Pain Location Shoulder    Pain Orientation Left    Aggravating Factors  reaching                             [facility] Adult PT Treatment/Exercise - 08/09/20 0001      Ambulation/Gait   Gait Comments 2x 115' first with left shoulder pain, I did some STM to the left upper trap and the second time she did not report the pain      Knee/Hip Exercises: Aerobic   Nustep level 5 x 8 minutes      Knee/Hip Exercises: Standing   Other Standing Knee Exercises in parallel bars, marches, hip abduction and then standing 1 minute with cues to hold posture especially for head posistion    Other Standing Knee Exercises in parallel bars stepping over objects and then 4" toe touches, then posture holding 1 minute      Knee/Hip Exercises: Seated   Other Seated Knee/Hip Exercises 5# marches    Other Seated Knee/Hip Exercises  green tband ankle PF/DF, left hip adduction                    PT Short Term Goals - 06/18/20 1105      PT SHORT TERM GOAL #1   Title independent with initial HEP    Status Partially Met             PT Long Term Goals - 08/05/20 1151      PT LONG TERM GOAL #3   Title walk 100 feet with SBA using the FWW      PT LONG TERM GOAL #4   Title transfer independently with set up    Status Partially Met                 Plan - 08/09/20 1338    Clinical Impression Statement Patient with again left shoulder pain, the first gait was limited due to the pain, I did some STM to the left upper trap area and the second round was much bettter and with less pain, she was able to hold good posture in the parallel bars fro about 15 seconds and then needing cues we did this 1 minute x 2 wiht rest break.  I had her step over two sticks on the ground in the parallel bars, she did well, she tried with each foot  going first and has a difficult time taking a big enough step.    PT Next Visit Plan work with her on her posture and function and see what we can do with OT if they have recommendations    Consulted and Agree with Plan of Care Patient           Patient will benefit from skilled therapeutic intervention in order to improve the following deficits and impairments:  Abnormal gait,Cardiopulmonary status limiting activity,Decreased activity tolerance,Decreased balance,Decreased mobility,Decreased strength,Decreased coordination,Decreased range of motion,Difficulty walking,Increased muscle spasms,Pain  Visit Diagnosis: Difficulty in walking, not elsewhere classified  Late effects of CVA (cerebrovascular accident)  Muscle weakness (generalized)  Hemiplegia and hemiparesis following cerebral infarction affecting left non-dominant side (HCC)  Ataxia, unspecified  Chronic left shoulder pain  Stiffness of left knee, not elsewhere classified  Pain in left ankle and joints of left foot     Problem List Patient Active Problem List   Diagnosis Date Noted  . Presbycusis of both ears 03/08/2020  . Mixed stress and urge urinary incontinence 12/02/2019  . Macrocytosis 12/01/2019  . Nutritional anemia 12/01/2019  . S/P total knee replacement 01/27/2019  . Recurrent left knee instability 07/05/2018  . Respiratory failure with hypoxia ([place]) 08/30/2017  . Hypoxemia   . Heart failure with preserved ejection fraction ([place]), Grade 3 diastolic dysfunction 67/67/2094  . PAF (paroxysmal atrial fibrillation) ([place])   . Dyspnea 03/19/2017  . Encounter for preventive health examination 02/17/2016  . Sensorineural hearing loss (SNHL), bilateral 01/26/2016  . Morbid obesity ([place]) 06/17/2015  . Hypomagnesemia 04/24/2014  . Hemiparesis affecting left side as late effect of cerebrovascular accident ([place]) 04/24/2014  . Nontraumatic cerebral hemorrhage ([place]) 04/30/2012  . DM (diabetes mellitus) with  complications ([place]) 70/96/2836  . OSTEOPENIA 01/21/2009  . UNSPECIFIED VITAMIN D DEFICIENCY 11/19/2007  . ESSENTIAL HYPERTENSION, BENIGN 11/19/2007  . HYPERCHOLESTEROLEMIA 10/25/2006  . GASTROESOPHAGEAL REFLUX, NO ESOPHAGITIS 10/25/2006  . DIVERTICULOSIS OF COLON 10/25/2006  . Osteoarthritis 10/25/2006  . CERVICAL SPINE DISORDER, NOS 10/25/2006    Sumner Boast., PT 08/09/2020, 1:42 PM  [facility] [phone] [address] [place], [place], [address] Phone: [phone]   Fax:  [phone]  Name: Karen Jackson MRN: [id number] Date of Birth: 1941-03-03

## 2020-08-10 NOTE — Therapy (Signed)
[place] [place], [place], [address] Phone: [phone]   Fax:  [phone]  Occupational Therapy Evaluation  Patient Details  Name: Karen Jackson MRN: [id number] Date of Birth: 1941-02-28 Referring Provider (OT): Madison Hickman   Encounter Date: 08/09/2020   OT End of Session - 08/09/20 1339    Visit Number 1    Number of Visits 13    Date for OT Re-Evaluation 09/20/20    Authorization Type Medicare    Progress Note Due on Visit 10    OT Start Time 1018    OT Stop Time 1056    OT Time Calculation (min) 38 min    Activity Tolerance Patient tolerated treatment well;Patient limited by pain    Behavior During Therapy [facility] for tasks assessed/performed           Past Medical History:  Diagnosis Date  . Acute cystitis without hematuria   . Acute diastolic CHF (congestive heart failure) ([place])   . Arthritis   . Dyspnea   . Dysrhythmia   . Fever of unknown origin 03/19/2017  . Hyperlipidemia   . Hypertension    denies at preop  . Multifocal pneumonia   . Neuromuscular disorder ([place])    neuropathy left arm and foot  . Osteopenia   . Paralysis ([place])    partial left side from CVA   . Persistent atrial fibrillation ([place])   . PONV (postoperative nausea and vomiting)   . Pre-diabetes   . Stroke [facility]) 2013   hemmorahgic    Past Surgical History:  Procedure Laterality Date  . ANKLE SURGERY    . APPENDECTOMY    . CHOLECYSTECTOMY    . HERNIA REPAIR     Esophagus  . JOINT REPLACEMENT     total- right partial- left  . MASTECTOMY PARTIAL / LUMPECTOMY  2012   left  . ORIF ANKLE FRACTURE Left 07/20/2018   Procedure: OPEN REDUCTION INTERNAL FIXATION (ORIF) ANKLE FRACTURE;  Surgeon: Wylene Simmer, MD;  Location: [place];  Service: Orthopedics;  Laterality: Left;  . TOTAL KNEE ARTHROPLASTY Left 01/27/2019   Procedure: TOTAL KNEE ARTHROPLASTY;  Surgeon: Vickey Huger, MD;  [facility];  Service: Orthopedics;   Laterality: Left;    There were no vitals filed for this visit.   Subjective Assessment - 08/09/20 1023    Subjective  "I want to be able to use my arm and fingers"    Patient is accompanied by: --   Caregiver   Pertinent History CVA with L hemiparesis (2013), OA, HTN, CHF, pre-diabetes    Limitations Pain in LUE, impaired sensation, decreased strength and coordination, decreased PROM/AROM    Patient Stated Goals Improve functional use of LUE, decrease pain    Currently in Pain? Yes    Pain Score 6     Pain Location Shoulder    Pain Orientation Left    Pain Descriptors / Indicators Tingling;Sore    Pain Type Chronic pain    Pain Radiating Towards shoulder to elbow    Pain Onset More than a month ago    Pain Frequency Intermittent    Aggravating Factors  reaching, lifting, walking w/ walker, cold    Pain Relieving Factors heat, Celebrex (daily)    Multiple Pain Sites No             [facility] OT Assessment - 08/09/20 1028      Assessment   Medical Diagnosis CVA with L-sided hemiparesis  Referring Provider (OT) Madison Hickman    Onset Date/Surgical Date 04/30/12    Hand Dominance Right    Prior Therapy HH therapy, OP OT/PT   currently receiving OPPT     Precautions   Precautions None    Other Brace/Splint L AFO      Restrictions   Weight Bearing Restrictions No      Balance Screen   Has the patient fallen in the past 6 months No    Has the patient had a decrease in activity level because of a fear of falling?  No    Is the patient reluctant to leave their home because of a fear of falling?  No      Home  Environment   Family/patient expects to be discharged to: Private residence    Living Arrangements Alone    Available Help at Discharge Other (Comment)   Son; In-home caregiver: 7am-2pm, 4:30-8pm (daily)   Type of [place] entrance    Home Layout One level    Bathroom Shower/Tub [place] height     Bathroom Accessibility Yes    How accessible Accessible via wheelchair;Accessible via walker    Adaptive equipment Reacher;Long-handled sponge    [place] - 2 wheels;Wheelchair - manual;Grab bars - toilet;Grab bars - tub/shower;Hand held shower head;Shower seat    Lives With --      Prior Function   Level of Independence Needs assistance with ADLs    Vocation Retired   Ship broker --    Leisure reading, games on iPad, bridge (Friday)      ADL   Eating/Feeding Set up   IND after set-up   Grooming Modified independent    Grooming details wash face, brush teeth, brush hair    Upper Body Bathing Supervision/safety    Lower Body Bathing Supervision/safety    Lower Body Bathing Details requires long-handled sponge    Upper Body Dressing Moderate assistance;Increased time    Upper Body Dressing Details limited due to LUE pain    Lower Body Dressing Moderate assistance;Needs assist for fasteners    Toilet Transfer Supervision/safety    Brewing technologist bars    Toileting - Clothing Manipulation Moderate assistance   requires assist with pulling up   [place] Independent;Use of adaptive device    Tub/Shower Transfer Supervision/safety    Tub/Shower Transfer Details (indicate cue type and reason uses w/c to get in/out; grab bars    Warden/ranger Grab bars;Walk in shower    Equipment Used Wheelchair      IADL   Shopping Completely unable to shop    Prior Level of Function Light Housekeeping Housekeeper    Light Housekeeping Does not participate in any housekeeping tasks    Meal Prep Does not utilize stove or oven;Needs to have meals prepared and served    [employer] on family or friends for transportation   Caregivers also provide transportation when needed   Medication Management Is responsible for taking medication in correct dosages at correct time   pillbox   Financial Management Requires  assistance   Son assists with financial mgmt     Mobility   Mobility Status Comments Pt arrived in w/c; uses rolling walker when ambulating      Written Expression   Dominant Hand Right    Handwriting --   Pt reports no difficulties  Vision - History   Baseline Vision --    Patient Visual Report --   has glasses, pt reports not needing to wear them recently     Vision Assessment   Vision Assessment Vision not tested      Cognition   Overall Cognitive Status Within Functional Limits for tasks assessed    Memory Appears intact      Posture/Postural Control   Posture Comments posterior pelvic tilt, rounded shoulders      Sensation   Light Touch Impaired by gross assessment   Pt unable to identify or localize touch on LUE   Proprioception Not tested   pt reports decreased proprioception     Coordination   Gross Motor Movements are Fluid and Coordinated No   GM severely limited by pain in L shoulder   Fine Motor Movements are Fluid and Coordinated No    Coordination and Movement Description Ataxia    Finger Nose Finger Test Impaired    9 Hole Peg Test --   Unable to complete due to LUE pain     ROM / Strength   AROM / PROM / Strength AROM;PROM;Strength      AROM   Overall AROM Comments RUE WFL; LUE shoulder AROM in all planes significantly limited by pain in L shoulder    AROM Assessment Site Shoulder;Elbow;Wrist;Finger    Right/Left Elbow --   WFL   Right/Left Wrist --   WFL   Right/Left Finger --   WFL     PROM   Overall PROM  Unable to assess;Due to pain   LUE     Strength   Overall Strength --   Unable to assess LUE due to pain   Strength Assessment Site Shoulder;Elbow    Right/Left Shoulder Right    Right Shoulder Flexion 4-/5    Right Shoulder Extension 3+/5    Right Shoulder ABduction 3+/5    Right/Left Elbow Right    Right Elbow Flexion 4-/5    Right Elbow Extension 3+/5      Hand Function   Right Hand Gross Grasp Functional    Right Hand Grip (lbs)  33 lbs    Left Hand Gross Grasp Impaired    Left Hand Grip (lbs) 11 lbs                       OT Long Term Goals - 08/09/20 1405      OT LONG TERM GOAL #1   Title Pt will demonstrate/verbalize understanding of compensation and joint protection strategies to decrease pain and increase independence during ADLs.    Baseline Currently Mod A with dressing; participation in ADLs limited by pain    Time 6    Period Weeks    Status New    Target Date 09/20/20      OT LONG TERM GOAL #2   Title Pt will improve functional FM coordination of as evidenced by ability to place >50% of pegs into pegboard with LUE.    Baseline Unable to place small pegs with LUE during eval.    Time 6    Period Weeks    Status New    Target Date 09/20/20      OT LONG TERM GOAL #3   Title Pt will be independent with HEP for BUE strengthening and GM coordination to improve independence with functional tasks.    Baseline Pt reports no HEP for UE strength/coordination.    Time 6  Period Weeks    Status New    Target Date 09/20/20      OT LONG TERM GOAL #4   Title Pt will improve functional grasp of L hand as evidenced by increasing grip strength by >9 lbs.    Baseline LUE = 11 lbs; RUE = 33 lbs    Time 6    Period Weeks    Status New    Target Date 09/20/20                 Plan - 08/09/20 1346    Clinical Impression Statement Pt is a 79 y.o. female who presents to OP OT with complaints of L-sided weakness, shoulder pain, and neuropathy as late effects of CVA. PMH of hemmoraghic CVA (2013), CHF, osteoarthritis, HLD, HTN, and pre-diabetes. Pt currently lives alone in a single-story house and receives assistance from hired caregivers (daily) and her son. Pt will benefit from skilled occupational therapy services to address the following: decreased GM and FM coordination, decreased strength and range of motion, impaired sensation, LUE pain, body mechanics, compensatory and joint protection  strategies, functional mobility, and improved safety and independence with functional activities.    OT Occupational Profile and History Problem Focused Assessment - Including review of records relating to presenting problem    Occupational performance deficits (Please refer to evaluation for details): ADL's;IADL's    Body Structure / Function / Physical Skills ADL;UE functional use;Balance;Body mechanics;Pain;FMC;Proprioception;ROM;Coordination;GMC;Sensation;Strength;Dexterity    Rehab Potential Fair    Clinical Decision Making Several treatment options, min-mod task modification necessary    Comorbidities Affecting Occupational Performance: May have comorbidities impacting occupational performance    Modification or Assistance to Complete Evaluation  Min-Moderate modification of tasks or assist with assess necessary to complete eval    OT Frequency 2x / week    OT Duration 6 weeks    OT Treatment/Interventions Self-care/ADL training;Ultrasound;Compression bandaging;DME and/or AE instruction;Patient/family education;Passive range of motion;Electrical Stimulation;Splinting;Moist Heat;Therapeutic exercise;Manual Therapy;Therapeutic activities;Neuromuscular education;Energy conservation;Iontophoresis;Cryotherapy;Functional Mobility Training;Aquatic Therapy    Plan Address pain, initiate HEP for compensation strategies, assess FMC and BUE positioning with walker    Consulted and Agree with Plan of Care Patient           Patient will benefit from skilled therapeutic intervention in order to improve the following deficits and impairments:   Body Structure / Function / Physical Skills: ADL,UE functional use,Balance,Body mechanics,Pain,FMC,Proprioception,ROM,Coordination,GMC,Sensation,Strength,Dexterity       Visit Diagnosis: Hemiplegia and hemiparesis following cerebral infarction affecting left non-dominant side ([place]) - Plan: Ot plan of care cert/re-cert  Ataxia - Plan: Ot plan of care  cert/re-cert  Left shoulder pain, unspecified chronicity - Plan: Ot plan of care cert/re-cert  Muscle weakness (generalized) - Plan: Ot plan of care cert/re-cert  Abnormal posture - Plan: Ot plan of care cert/re-cert  Other lack of coordination - Plan: Ot plan of care cert/re-cert    Problem List Patient Active Problem List   Diagnosis Date Noted  . Presbycusis of both ears 03/08/2020  . Mixed stress and urge urinary incontinence 12/02/2019  . Macrocytosis 12/01/2019  . Nutritional anemia 12/01/2019  . S/P total knee replacement 01/27/2019  . Recurrent left knee instability 07/05/2018  . Respiratory failure with hypoxia ([place]) 08/30/2017  . Hypoxemia   . Heart failure with preserved ejection fraction ([place]), Grade 3 diastolic dysfunction 37/85/8850  . PAF (paroxysmal atrial fibrillation) ([place])   . Dyspnea 03/19/2017  . Encounter for preventive health examination 02/17/2016  . Sensorineural hearing loss (SNHL), bilateral 01/26/2016  .  Morbid obesity ([place]) 06/17/2015  . Hypomagnesemia 04/24/2014  . Hemiparesis affecting left side as late effect of cerebrovascular accident ([place]) 04/24/2014  . Nontraumatic cerebral hemorrhage ([place]) 04/30/2012  . DM (diabetes mellitus) with complications ([place]) 91/50/5697  . OSTEOPENIA 01/21/2009  . UNSPECIFIED VITAMIN D DEFICIENCY 11/19/2007  . ESSENTIAL HYPERTENSION, BENIGN 11/19/2007  . HYPERCHOLESTEROLEMIA 10/25/2006  . GASTROESOPHAGEAL REFLUX, NO ESOPHAGITIS 10/25/2006  . DIVERTICULOSIS OF COLON 10/25/2006  . Osteoarthritis 10/25/2006  . CERVICAL SPINE DISORDER, NOS 10/25/2006    Kathrine Cords, OTR/L, MSOT 08/10/2020, 1:11 PM  [address] [place], [place], [address] Phone: [phone]   Fax:  [phone]  Name: Karen Jackson MRN: [id number] Date of Birth: 04-23-1941

## 2020-08-13 ENCOUNTER — Ambulatory Visit: Payer: Medicare Other | Admitting: Occupational Therapy

## 2020-08-13 ENCOUNTER — Ambulatory Visit: Payer: Medicare Other | Admitting: Physical Therapy

## 2020-08-13 ENCOUNTER — Encounter: Payer: Self-pay | Admitting: Physical Therapy

## 2020-08-13 ENCOUNTER — Other Ambulatory Visit: Payer: Self-pay

## 2020-08-13 DIAGNOSIS — R293 Abnormal posture: Secondary | ICD-10-CM

## 2020-08-13 DIAGNOSIS — R262 Difficulty in walking, not elsewhere classified: Secondary | ICD-10-CM

## 2020-08-13 DIAGNOSIS — I6990 Unspecified sequelae of unspecified cerebrovascular disease: Secondary | ICD-10-CM

## 2020-08-13 DIAGNOSIS — I69354 Hemiplegia and hemiparesis following cerebral infarction affecting left non-dominant side: Secondary | ICD-10-CM

## 2020-08-13 DIAGNOSIS — G8929 Other chronic pain: Secondary | ICD-10-CM

## 2020-08-13 DIAGNOSIS — R270 Ataxia, unspecified: Secondary | ICD-10-CM

## 2020-08-13 DIAGNOSIS — R278 Other lack of coordination: Secondary | ICD-10-CM

## 2020-08-13 DIAGNOSIS — M25512 Pain in left shoulder: Secondary | ICD-10-CM

## 2020-08-13 DIAGNOSIS — M6281 Muscle weakness (generalized): Secondary | ICD-10-CM

## 2020-08-13 NOTE — Therapy (Signed)
[address] [place], [place], [address] Phone: [phone]   Fax:  [phone]  Physical Therapy Treatment  Patient Details  Name: Karen Jackson MRN: [id number] Date of Birth: 05/04/1941 Referring Provider (PT): [place]   Encounter Date: 08/13/2020   PT End of Session - 08/13/20 1147    Visit Number 19    Date for PT Re-Evaluation 09/13/20    PT Start Time 1102    PT Stop Time 1148    PT Time Calculation (min) 46 min    Activity Tolerance Patient tolerated treatment well    Behavior During Therapy [facility] for tasks assessed/performed           Past Medical History:  Diagnosis Date  . Acute cystitis without hematuria   . Acute diastolic CHF (congestive heart failure) ([place])   . Arthritis   . Dyspnea   . Dysrhythmia   . Fever of unknown origin 03/19/2017  . Hyperlipidemia   . Hypertension    denies at preop  . Multifocal pneumonia   . Neuromuscular disorder ([place])    neuropathy left arm and foot  . Osteopenia   . Paralysis ([place])    partial left side from CVA   . Persistent atrial fibrillation ([place])   . PONV (postoperative nausea and vomiting)   . Pre-diabetes   . Stroke [facility]) 2013   hemmorahgic    Past Surgical History:  Procedure Laterality Date  . ANKLE SURGERY    . APPENDECTOMY    . CHOLECYSTECTOMY    . HERNIA REPAIR     Esophagus  . JOINT REPLACEMENT     total- right partial- left  . MASTECTOMY PARTIAL / LUMPECTOMY  2012   left  . ORIF ANKLE FRACTURE Left 07/20/2018   Procedure: OPEN REDUCTION INTERNAL FIXATION (ORIF) ANKLE FRACTURE;  Surgeon: Wylene Simmer, MD;  Location: [place];  Service: Orthopedics;  Laterality: Left;  . TOTAL KNEE ARTHROPLASTY Left 01/27/2019   Procedure: TOTAL KNEE ARTHROPLASTY;  Surgeon: Vickey Huger, MD;  [facility];  Service: Orthopedics;  Laterality: Left;    There were no vitals filed for this visit.   Subjective Assessment - 08/13/20 1110    Subjective  Patient reports that she had a new caregiver this morning so, everything was very hectic and she is a little stressed and fatigued    Currently in Pain? Yes    Pain Score 5     Pain Location Shoulder    Pain Orientation Left    Aggravating Factors  walking              [facility] PT Assessment - 08/13/20 0001      Timed Up and Go Test   Normal TUG (seconds) 38                         [facility] Adult PT Treatment/Exercise - 08/13/20 0001      Ambulation/Gait   Gait Comments 115' with FWW, had one time when her left toe caught needed mod A to correct, 120' with cues for posture      Knee/Hip Exercises: Standing   Other Standing Knee Exercises posture tall standing in the parallel bars    Other Standing Knee Exercises in parallel bars stepping over objects and then 6" toe touches, then posture holding 1 minute      Knee/Hip Exercises: Seated   Long Arc Quad Both;3 sets;10 reps    Long Arc  Quad Weight 5 lbs.    Other Seated Knee/Hip Exercises green tband ankle PF/DF, left hip adduction                    PT Short Term Goals - 06/18/20 1105      PT SHORT TERM GOAL #1   Title independent with initial HEP    Status Partially Met             PT Long Term Goals - 08/13/20 1154      PT LONG TERM GOAL #1   Title walk with hand hold assist x 100 feet    Status Partially Met      PT LONG TERM GOAL #2   Title increased right LE strength to 4-/5    Status Partially Met      PT LONG TERM GOAL #3   Title walk 100 feet with SBA using the FWW    Status Partially Met      PT LONG TERM GOAL #4   Title transfer independently with set up    Status Partially Met      PT LONG TERM GOAL #5   Title decrease TUG to 39 seconds    Status Partially Met                 Plan - 08/13/20 1149    Clinical Impression Statement Less shoulder pain today, she is walking farther, and I have had her try to negotiate obstacles, she reports that she is doing this  better at home and reports that her ankle feels stronger and has had less rolling when she is getting in and out of the shower.  She has had some left shoulder pain with walking, she does have a lot of spasticity and shoulder elevation with walking.  We did get her an order for OT and she has been evaluated for this to see if htey can help the shoulder pain and the arm function    PT Frequency 2x / week    PT Duration 4 weeks    PT Treatment/Interventions ADLs/Self Care Home Management;Cryotherapy;Electrical Stimulation;Moist Heat;Gait training;Functional mobility training;Therapeutic activities;Therapeutic exercise;Balance training;Neuromuscular re-education;Patient/family education;Manual techniques    PT Next Visit Plan work with her on her posture and function and see what we can do with OT if they have recommendations    Consulted and Agree with Plan of Care Patient           Patient will benefit from skilled therapeutic intervention in order to improve the following deficits and impairments:  Abnormal gait,Cardiopulmonary status limiting activity,Decreased activity tolerance,Decreased balance,Decreased mobility,Decreased strength,Decreased coordination,Decreased range of motion,Difficulty walking,Increased muscle spasms,Pain  Visit Diagnosis: Hemiplegia and hemiparesis following cerebral infarction affecting left non-dominant side ([place]) - Plan: PT plan of care cert/re-cert  Ataxia - Plan: PT plan of care cert/re-cert  Left shoulder pain, unspecified chronicity - Plan: PT plan of care cert/re-cert  Muscle weakness (generalized) - Plan: PT plan of care cert/re-cert  Abnormal posture - Plan: PT plan of care cert/re-cert  Other lack of coordination - Plan: PT plan of care cert/re-cert  Difficulty in walking, not elsewhere classified - Plan: PT plan of care cert/re-cert  Late effects of CVA (cerebrovascular accident) - Plan: PT plan of care cert/re-cert  Ataxia, unspecified - Plan: PT  plan of care cert/re-cert  Chronic left shoulder pain - Plan: PT plan of care cert/re-cert     Problem List Patient Active Problem List   Diagnosis Date Noted  .  Presbycusis of both ears 03/08/2020  . Mixed stress and urge urinary incontinence 12/02/2019  . Macrocytosis 12/01/2019  . Nutritional anemia 12/01/2019  . S/P total knee replacement 01/27/2019  . Recurrent left knee instability 07/05/2018  . Respiratory failure with hypoxia ([place]) 08/30/2017  . Hypoxemia   . Heart failure with preserved ejection fraction ([place]), Grade 3 diastolic dysfunction 93/23/5573  . PAF (paroxysmal atrial fibrillation) ([place])   . Dyspnea 03/19/2017  . Encounter for preventive health examination 02/17/2016  . Sensorineural hearing loss (SNHL), bilateral 01/26/2016  . Morbid obesity ([place]) 06/17/2015  . Hypomagnesemia 04/24/2014  . Hemiparesis affecting left side as late effect of cerebrovascular accident ([place]) 04/24/2014  . Nontraumatic cerebral hemorrhage ([place]) 04/30/2012  . DM (diabetes mellitus) with complications ([place]) 22/09/5425  . OSTEOPENIA 01/21/2009  . UNSPECIFIED VITAMIN D DEFICIENCY 11/19/2007  . ESSENTIAL HYPERTENSION, BENIGN 11/19/2007  . HYPERCHOLESTEROLEMIA 10/25/2006  . GASTROESOPHAGEAL REFLUX, NO ESOPHAGITIS 10/25/2006  . DIVERTICULOSIS OF COLON 10/25/2006  . Osteoarthritis 10/25/2006  . CERVICAL SPINE DISORDER, NOS 10/25/2006    Sumner Boast., PT 08/13/2020, 11:57 AM  [address]. [place], [place], [address] Phone: [phone]   Fax:  [phone]  Name: Karen Jackson MRN: [id number] Date of Birth: 07-Nov-1940

## 2020-08-16 ENCOUNTER — Ambulatory Visit: Payer: Medicare Other | Admitting: Physical Therapy

## 2020-08-16 ENCOUNTER — Other Ambulatory Visit: Payer: Self-pay

## 2020-08-16 ENCOUNTER — Encounter: Payer: Self-pay | Admitting: Physical Therapy

## 2020-08-16 ENCOUNTER — Ambulatory Visit: Payer: Medicare Other | Admitting: Occupational Therapy

## 2020-08-16 DIAGNOSIS — R293 Abnormal posture: Secondary | ICD-10-CM

## 2020-08-16 DIAGNOSIS — M25512 Pain in left shoulder: Secondary | ICD-10-CM

## 2020-08-16 DIAGNOSIS — I69354 Hemiplegia and hemiparesis following cerebral infarction affecting left non-dominant side: Secondary | ICD-10-CM

## 2020-08-16 DIAGNOSIS — R270 Ataxia, unspecified: Secondary | ICD-10-CM

## 2020-08-16 DIAGNOSIS — R278 Other lack of coordination: Secondary | ICD-10-CM

## 2020-08-16 DIAGNOSIS — M6281 Muscle weakness (generalized): Secondary | ICD-10-CM

## 2020-08-16 DIAGNOSIS — I6990 Unspecified sequelae of unspecified cerebrovascular disease: Secondary | ICD-10-CM

## 2020-08-16 DIAGNOSIS — G8929 Other chronic pain: Secondary | ICD-10-CM

## 2020-08-16 DIAGNOSIS — M25662 Stiffness of left knee, not elsewhere classified: Secondary | ICD-10-CM

## 2020-08-16 DIAGNOSIS — R262 Difficulty in walking, not elsewhere classified: Secondary | ICD-10-CM | POA: Diagnosis not present

## 2020-08-16 NOTE — Therapy (Signed)
[place]. [place], [place], [address] Phone: [phone]   Fax:  [phone] Progress Note Reporting Period 07/16/20 to 08/16/20 for visit 11-20  See note below for Objective Data and Assessment of Progress/Goals.      Physical Therapy Treatment  Patient Details  Name: Karen Jackson MRN: [id number] Date of Birth: [birth year] Referring Provider (PT): Hensel   Encounter Date: 08/16/2020   PT End of Session - 08/16/20 1156    Visit Number 20    Date for PT Re-Evaluation 09/13/20    PT Start Time 1101    PT Stop Time 1143    PT Time Calculation (min) 42 min    Activity Tolerance Patient tolerated treatment well    Behavior During Therapy [facility] for tasks assessed/performed           Past Medical History:  Diagnosis Date   Acute cystitis without hematuria    Acute diastolic CHF (congestive heart failure) (HCC)    Arthritis    Dyspnea    Dysrhythmia    Fever of unknown origin 03/19/2017   Hyperlipidemia    Hypertension    denies at preop   Multifocal pneumonia    Neuromuscular disorder ([place])    neuropathy left arm and foot   Osteopenia    Paralysis ([place])    partial left side from CVA    Persistent atrial fibrillation (HCC)    PONV (postoperative nausea and vomiting)    Pre-diabetes    Stroke [facility]) 2013   hemmorahgic    Past Surgical History:  Procedure Laterality Date   ANKLE SURGERY     APPENDECTOMY     CHOLECYSTECTOMY     HERNIA REPAIR     Esophagus   JOINT REPLACEMENT     total- right partial- left   MASTECTOMY PARTIAL / LUMPECTOMY  2012   left   ORIF ANKLE FRACTURE Left 07/20/2018   Procedure: OPEN REDUCTION INTERNAL FIXATION (ORIF) ANKLE FRACTURE;  Surgeon: Wylene Simmer, MD;  Location: [place];  Service: Orthopedics;  Laterality: Left;   TOTAL KNEE ARTHROPLASTY Left 01/27/2019   Procedure: TOTAL KNEE ARTHROPLASTY;  Surgeon: Vickey Huger, MD;  [facility];   Service: Orthopedics;  Laterality: Left;    There were no vitals filed for this visit.   Subjective Assessment - 08/16/20 1119    Subjective Patient saw the OT prior to me, she has bought a brace for the left shoulder, the OT placed it and we used it during the treatment and walking.  She reports tthat she had a good weekend, still painful left shoulder and left upper arm    Currently in Pain? No/denies    Pain Radiating Towards tingling arm from elbow to shoulder                             [facility] Adult PT Treatment/Exercise - 08/16/20 0001      Ambulation/Gait   Gait Comments 100' with the new shoulder brace using FWW, then 100 feet again.  She becomes very fatigued and when sitting with this she is unsafe.  We are currently working to see if we can get her to be able to walk into her church      Knee/Hip Exercises: Aerobic   Nustep level 5 x 8 minutes      Knee/Hip Exercises: Seated   Long Arc [employer] Both;3 sets;10 reps    [employer]  Weight 5 lbs.    Other Seated Knee/Hip Exercises green tband ankle PF/DF, left hip adduction    Hamstring Curl Both;3 sets;10 reps    Hamstring Limitations green tband                    PT Short Term Goals - 06/18/20 1105      PT SHORT TERM GOAL #1   Title independent with initial HEP    Status Partially Met             PT Long Term Goals - 08/13/20 1154      PT LONG TERM GOAL #1   Title walk with hand hold assist x 100 feet    Status Partially Met      PT LONG TERM GOAL #2   Title increased right LE strength to 4-/5    Status Partially Met      PT LONG TERM GOAL #3   Title walk 100 feet with SBA using the FWW    Status Partially Met      PT LONG TERM GOAL #4   Title transfer independently with set up    Status Partially Met      PT LONG TERM GOAL #5   Title decrease TUG to 39 seconds    Status Partially Met                 Plan - 08/16/20 1156    Clinical Impression Statement Has a  new brace that the OT helped her with today for the left shoulder, she had it on throughout the session, she reported that the shoulder felt better with it while walking, she did have some increased tingling from the shoulder to the elbow, it was checked and all was well, she had less pain in the shoulder with it on while walking, toward the end still a lot of crepitus in the shoulders.  She really fatigues today with the 100 feet walking, she did have OT right before our treatment.  We are working to where she can get into the church in the future with walking    PT Next Visit Plan work with her on her posture and function and see what we can do with OT if they have recommendations    Consulted and Agree with Plan of Care Patient           Patient will benefit from skilled therapeutic intervention in order to improve the following deficits and impairments:  Abnormal gait,Cardiopulmonary status limiting activity,Decreased activity tolerance,Decreased balance,Decreased mobility,Decreased strength,Decreased coordination,Decreased range of motion,Difficulty walking,Increased muscle spasms,Pain  Visit Diagnosis: Hemiplegia and hemiparesis following cerebral infarction affecting left non-dominant side (HCC)  Ataxia  Left shoulder pain, unspecified chronicity  Muscle weakness (generalized)  Abnormal posture  Other lack of coordination  Difficulty in walking, not elsewhere classified  Late effects of CVA (cerebrovascular accident)  Ataxia, unspecified  Chronic left shoulder pain  Stiffness of left knee, not elsewhere classified     Problem List Patient Active Problem List   Diagnosis Date Noted   Presbycusis of both ears 03/08/2020   Mixed stress and urge urinary incontinence 12/02/2019   Macrocytosis 12/01/2019   Nutritional anemia 12/01/2019   S/P total knee replacement 01/27/2019   Recurrent left knee instability 07/05/2018   Respiratory failure with hypoxia ([place])  08/30/2017   Hypoxemia    Heart failure with preserved ejection fraction ([place]), Grade 3 diastolic dysfunction 54/62/7035   PAF (paroxysmal atrial fibrillation) ([place])  Dyspnea 03/19/2017   Encounter for preventive health examination 02/17/2016   Sensorineural hearing loss (SNHL), bilateral 01/26/2016   Morbid obesity ([place]) 06/17/2015   Hypomagnesemia 04/24/2014   Hemiparesis affecting left side as late effect of cerebrovascular accident ([place]) 04/24/2014   Nontraumatic cerebral hemorrhage ([place]) 04/30/2012   DM (diabetes mellitus) with complications ([place]) 35/68/6168   OSTEOPENIA 01/21/2009   UNSPECIFIED VITAMIN D DEFICIENCY 11/19/2007   ESSENTIAL HYPERTENSION, BENIGN 11/19/2007   HYPERCHOLESTEROLEMIA 10/25/2006   GASTROESOPHAGEAL REFLUX, NO ESOPHAGITIS 10/25/2006   DIVERTICULOSIS OF COLON 10/25/2006   Osteoarthritis 10/25/2006   CERVICAL SPINE DISORDER, NOS 10/25/2006    Sumner Boast., PT 08/16/2020, 11:59 AM  [place]. [place], [place], [address] Phone: [phone]   Fax:  [phone]  Name: THEDA PAYER MRN: [id number] Date of Birth: 04-30-1941

## 2020-08-17 NOTE — Therapy (Signed)
[facility] [address]. [place], [place], [address] Phone: [phone]   Fax:  [phone]  Occupational Therapy Treatment  Patient Details  Name: Karen Jackson MRN: [id number] Date of Birth: 1941-04-18 Referring Provider (OT): Doralee Albino   Encounter Date: 08/16/2020   OT End of Session - 08/17/20 0815    Visit Number 2    Number of Visits 13    Date for OT Re-Evaluation 09/20/20    Authorization Type Medicare    Progress Note Due on Visit 10    OT Start Time 1017    OT Stop Time 1100    OT Time Calculation (min) 43 min    Activity Tolerance Patient tolerated treatment well;Patient limited by pain    Behavior During Therapy [facility] for tasks assessed/performed           Past Medical History:  Diagnosis Date  . Acute cystitis without hematuria   . Acute diastolic CHF (congestive heart failure) (HCC)   . Arthritis   . Dyspnea   . Dysrhythmia   . Fever of unknown origin 03/19/2017  . Hyperlipidemia   . Hypertension    denies at preop  . Multifocal pneumonia   . Neuromuscular disorder (HCC)    neuropathy left arm and foot  . Osteopenia   . Paralysis (HCC)    partial left side from CVA   . Persistent atrial fibrillation (HCC)   . PONV (postoperative nausea and vomiting)   . Pre-diabetes   . Stroke [facility]) 2013   hemmorahgic    Past Surgical History:  Procedure Laterality Date  . ANKLE SURGERY    . APPENDECTOMY    . CHOLECYSTECTOMY    . HERNIA REPAIR     Esophagus  . JOINT REPLACEMENT     total- right partial- left  . MASTECTOMY PARTIAL / LUMPECTOMY  2012   left  . ORIF ANKLE FRACTURE Left 07/20/2018   Procedure: OPEN REDUCTION INTERNAL FIXATION (ORIF) ANKLE FRACTURE;  Surgeon: Toni Arthurs, MD;  [facility];  Service: Orthopedics;  Laterality: Left;  . TOTAL KNEE ARTHROPLASTY Left 01/27/2019   Procedure: TOTAL KNEE ARTHROPLASTY;  Surgeon: Dannielle Huh, MD;  [facility];  Service: Orthopedics;   Laterality: Left;    There were no vitals filed for this visit.   Subjective Assessment - 08/16/20 2052    Subjective  Pt reports L shoulder feels better with new brace    Patient is accompanied by: --   Caregiver   Pertinent History CVA with L hemiparesis (2013), OA, HTN, CHF, pre-diabetes    Limitations Pain in LUE, impaired sensation, decreased strength and coordination, decreased PROM/AROM    Patient Stated Goals Improve functional use of LUE, decrease pain    Currently in Pain? No/denies              OT Treatments/Exercises (OP) - 08/17/20 2025      Shoulder Exercises: Seated   Flexion AAROM;15 reps   towel slides and PVC pipe square used to facilitate BUE flex/ext within pain-free ROM; pt able to reach ~30 degrees of flexion w/out pain. Mild dyspnea on exertion. Pt reported decreased pain in LUE when wearing shoulder support during exercises.     Fine Motor Coordination (Hand/Wrist)   Manipulation of small objects Picked up marbles one at a time and placed them in container on L side. Initially attempted to retrieve marbles with resistive clothespin with pt unable to pinch clothespin; activity was graded down. Verbal cues  required to slow down movements, which improved success. Therapist graded up activity to include shoulder external rotation and disc due to pain in L shoulder.            Shoulder Brace Support Fitting - OT completed fitting and appropriate adjustments of pre-fabricated pt-purchased LUE shoulder brace for subluxation initially recommended by PT. Pt reported noticing a tingling sensation around elbow that did not resolve when brace was loosened or removed; reports tingling may have been present prior to fitting. Brace decreased glenohumeral subluxation when standing w/ walker and pt reported decreased shoulder discomfort when wearing support. Appropriateness will continue to be assessed in a functional context and OT will provide further instruction for at-home  wear prn.      OT Long Term Goals - 08/17/20 0850      OT LONG TERM GOAL #1   Title Pt will demonstrate/verbalize understanding of compensation and joint protection strategies to decrease pain and increase independence during ADLs.    Baseline Currently Mod A with dressing; participation in ADLs limited by pain    Time 6    Period Weeks    Status On-going      OT LONG TERM GOAL #2   Title Pt will improve functional FM coordination of as evidenced by ability to place >50% of pegs into pegboard with LUE.    Baseline Unable to place small pegs with LUE during eval.    Time 6    Period Weeks    Status On-going      OT LONG TERM GOAL #3   Title Pt will be independent with HEP for BUE strengthening and GM coordination to improve independence with functional tasks.    Baseline Pt reports no HEP for UE strength/coordination.    Time 6    Period Weeks    Status On-going      OT LONG TERM GOAL #4   Title Pt will improve functional grasp of L hand as evidenced by increasing grip strength by >9 lbs.    Baseline LUE = 11 lbs; RUE = 33 lbs    Time 6    Period Weeks    Status On-going              Plan - 08/17/20 0817    Clinical Impression Statement Pt brought in self-purchased brace for fitting. Brace fit well and decreased glenohumeral subluxation, particularly when standing w/ walker. Will continue to assess appropriateness with PT and implement home-wearing schedule prn. Pt limited during OT by fatigue and L shoulder pain, but is able to identify movements that are painful and move within pain-free ROM consistently. Etiology of L shoulder pain will continue to be assessed further.    OT Occupational Profile and History Problem Focused Assessment - Including review of records relating to presenting problem    Occupational performance deficits (Please refer to evaluation for details): ADL's;IADL's    Body Structure / Function / Physical Skills ADL;UE functional use;Balance;Body  mechanics;Pain;FMC;Proprioception;ROM;Coordination;GMC;Sensation;Strength;Dexterity    Rehab Potential Fair    Clinical Decision Making Several treatment options, min-mod task modification necessary    Comorbidities Affecting Occupational Performance: May have comorbidities impacting occupational performance    Modification or Assistance to Complete Evaluation  Min-Moderate modification of tasks or assist with assess necessary to complete eval    OT Frequency 2x / week    OT Duration 6 weeks    OT Treatment/Interventions Self-care/ADL training;Ultrasound;Compression bandaging;DME and/or AE instruction;Patient/family education;Passive range of motion;Electrical Stimulation;Splinting;Moist Heat;Therapeutic exercise;Manual Therapy;Therapeutic activities;Neuromuscular education;Energy  conservation;Iontophoresis;Cryotherapy;Functional Mobility Training;Aquatic Therapy    Plan Continue to address pain in LUE, continue to work on [facility] and grip strength    Consulted and Agree with Plan of Care Patient           Patient will benefit from skilled therapeutic intervention in order to improve the following deficits and impairments:   Body Structure / Function / Physical Skills: ADL,UE functional use,Balance,Body mechanics,Pain,FMC,Proprioception,ROM,Coordination,GMC,Sensation,Strength,Dexterity       Visit Diagnosis: Hemiplegia and hemiparesis following cerebral infarction affecting left non-dominant side (HCC)  Ataxia  Left shoulder pain, unspecified chronicity  Muscle weakness (generalized)  Abnormal posture  Other lack of coordination    Problem List Patient Active Problem List   Diagnosis Date Noted  . Presbycusis of both ears 03/08/2020  . Mixed stress and urge urinary incontinence 12/02/2019  . Macrocytosis 12/01/2019  . Nutritional anemia 12/01/2019  . S/P total knee replacement 01/27/2019  . Recurrent left knee instability 07/05/2018  . Respiratory failure with hypoxia ([place])  08/30/2017  . Hypoxemia   . Heart failure with preserved ejection fraction ([facility]), Grade 3 diastolic dysfunction [id number]  . PAF (paroxysmal atrial fibrillation) ([place])   . Dyspnea 03/19/2017  . Encounter for preventive health examination 02/17/2016  . Sensorineural hearing loss (SNHL), bilateral 01/26/2016  . Morbid obesity ([place]) 06/17/2015  . Hypomagnesemia 04/24/2014  . Hemiparesis affecting left side as late effect of cerebrovascular accident ([place]) 04/24/2014  . Nontraumatic cerebral hemorrhage ([place]) 04/30/2012  . DM (diabetes mellitus) with complications ([place]) 0000000  . OSTEOPENIA 01/21/2009  . UNSPECIFIED VITAMIN D DEFICIENCY 11/19/2007  . ESSENTIAL HYPERTENSION, BENIGN 11/19/2007  . HYPERCHOLESTEROLEMIA 10/25/2006  . GASTROESOPHAGEAL REFLUX, NO ESOPHAGITIS 10/25/2006  . DIVERTICULOSIS OF COLON 10/25/2006  . Osteoarthritis 10/25/2006  . CERVICAL SPINE DISORDER, NOS 10/25/2006    Kathrine Cords, OTR/L, MSOT 08/17/2020, 9:32 AM  [place]. [place], [place], [address] Phone: [phone]   Fax:  [phone]  Name: Karen Jackson MRN: [id number] Date of Birth: 12-Sep-1940

## 2020-08-19 ENCOUNTER — Ambulatory Visit: Payer: Medicare Other | Admitting: Physical Therapy

## 2020-08-19 ENCOUNTER — Other Ambulatory Visit: Payer: Self-pay

## 2020-08-19 ENCOUNTER — Encounter: Payer: Self-pay | Admitting: Physical Therapy

## 2020-08-19 ENCOUNTER — Encounter: Payer: Medicare Other | Admitting: Occupational Therapy

## 2020-08-19 DIAGNOSIS — R262 Difficulty in walking, not elsewhere classified: Secondary | ICD-10-CM

## 2020-08-19 DIAGNOSIS — I69354 Hemiplegia and hemiparesis following cerebral infarction affecting left non-dominant side: Secondary | ICD-10-CM

## 2020-08-19 DIAGNOSIS — I6930 Unspecified sequelae of cerebral infarction: Secondary | ICD-10-CM

## 2020-08-19 DIAGNOSIS — R270 Ataxia, unspecified: Secondary | ICD-10-CM

## 2020-08-19 DIAGNOSIS — I6990 Unspecified sequelae of unspecified cerebrovascular disease: Secondary | ICD-10-CM

## 2020-08-19 DIAGNOSIS — R278 Other lack of coordination: Secondary | ICD-10-CM

## 2020-08-19 DIAGNOSIS — M6281 Muscle weakness (generalized): Secondary | ICD-10-CM

## 2020-08-19 DIAGNOSIS — M25512 Pain in left shoulder: Secondary | ICD-10-CM

## 2020-08-19 DIAGNOSIS — R293 Abnormal posture: Secondary | ICD-10-CM

## 2020-08-19 NOTE — Therapy (Signed)
[place]. [place], [place], [address] Phone: [phone]   Fax:  [phone]  Physical Therapy Treatment  Patient Details  Name: Karen Jackson MRN: [id number] Date of Birth: October 28, 1940 Referring Provider (PT): Hensel   Encounter Date: 08/19/2020   PT End of Session - 08/19/20 1158    Visit Number 21    Date for PT Re-Evaluation 09/13/20    PT Start Time 1102    PT Stop Time 1150    PT Time Calculation (min) 48 min    Activity Tolerance Patient limited by pain    Behavior During Therapy [facility] for tasks assessed/performed           Past Medical History:  Diagnosis Date  . Acute cystitis without hematuria   . Acute diastolic CHF (congestive heart failure) ([place])   . Arthritis   . Dyspnea   . Dysrhythmia   . Fever of unknown origin 03/19/2017  . Hyperlipidemia   . Hypertension    denies at preop  . Multifocal pneumonia   . Neuromuscular disorder ([place])    neuropathy left arm and foot  . Osteopenia   . Paralysis ([place])    partial left side from CVA   . Persistent atrial fibrillation ([place])   . PONV (postoperative nausea and vomiting)   . Pre-diabetes   . Stroke [facility]) 2013   hemmorahgic    Past Surgical History:  Procedure Laterality Date  . ANKLE SURGERY    . APPENDECTOMY    . CHOLECYSTECTOMY    . HERNIA REPAIR     Esophagus  . JOINT REPLACEMENT     total- right partial- left  . MASTECTOMY PARTIAL / LUMPECTOMY  2012   left  . ORIF ANKLE FRACTURE Left 07/20/2018   Procedure: OPEN REDUCTION INTERNAL FIXATION (ORIF) ANKLE FRACTURE;  Surgeon: Wylene Simmer, MD;  Location: [facility];  Service: Orthopedics;  Laterality: Left;  . TOTAL KNEE ARTHROPLASTY Left 01/27/2019   Procedure: TOTAL KNEE ARTHROPLASTY;  Surgeon: Vickey Huger, MD;  [facility];  Service: Orthopedics;  Laterality: Left;    There were no vitals filed for this visit.   Subjective Assessment - 08/19/20 1151    Subjective Patient  reports increased left shoulder pain, she is unsure of a cause, brings in the brace and wants [place] to apply    Currently in Pain? Yes    Pain Score 6     Pain Location Shoulder    Pain Orientation Left    Aggravating Factors  unsure why                             [facility] Adult PT Treatment/Exercise - 08/19/20 0001      Ambulation/Gait   Gait Comments 100' x 2 , with the brace, she had some pain and seemed to have more fatigue today and some increase of left shoulder pain, she does report tha tthe brace helps      Knee/Hip Exercises: Aerobic   Nustep level 5 x 9 minutes      Knee/Hip Exercises: Seated   Long Arc [employer] Both;3 sets;10 reps    Long Arc Quad Weight 5 lbs.    Other Seated Knee/Hip Exercises green tband ankle PF/DF, left hip adduction    Marching Both;2 sets;10 reps    Marching Weights 5 lbs.    Hamstring Curl Both;3 sets;10 reps    Hamstring Limitations green tband  Manual Therapy   Manual therapy comments STM to the left hand and shoulder into the upper trap, some gentle stretches of the left hand, fingers and shoulder, application of the left shoulder brace                    PT Short Term Goals - 06/18/20 1105      PT SHORT TERM GOAL #1   Title independent with initial HEP    Status Partially Met             PT Long Term Goals - 08/19/20 1202      PT LONG TERM GOAL #2   Title increased right LE strength to 4-/5    Status Partially Met      PT LONG TERM GOAL #3   Title walk 100 feet with SBA using the [place]    Status Achieved                 Plan - 08/19/20 1158    Clinical Impression Statement Patient has increased left shoulder pain and wanted [place] to put the shoulder brace on, she does report a stressful morning with a lost wallet and Christmas dinner has not arrived yet, it was supposed to be here earlier in the week.  She does have very tender areas with knots in the left upper trap, we may need to educate her  caregivers on how to apply the brace to help with the carryover at home.  We did not do as much walking due to the pain in the shoulder    PT Next Visit Plan talk with OT about educating caregivers and see if this can be done for the carryover    Consulted and Agree with Plan of Care Patient           Patient will benefit from skilled therapeutic intervention in order to improve the following deficits and impairments:  Abnormal gait,Cardiopulmonary status limiting activity,Decreased activity tolerance,Decreased balance,Decreased mobility,Decreased strength,Decreased coordination,Decreased range of motion,Difficulty walking,Increased muscle spasms,Pain  Visit Diagnosis: Hemiplegia and hemiparesis following cerebral infarction affecting left non-dominant side (HCC)  Ataxia  Left shoulder pain, unspecified chronicity  Muscle weakness (generalized)  Abnormal posture  Other lack of coordination  Difficulty in walking, not elsewhere classified  Late effects of CVA (cerebrovascular accident)  Ataxia, unspecified     Problem List Patient Active Problem List   Diagnosis Date Noted  . Presbycusis of both ears 03/08/2020  . Mixed stress and urge urinary incontinence 12/02/2019  . Macrocytosis 12/01/2019  . Nutritional anemia 12/01/2019  . S/P total knee replacement 01/27/2019  . Recurrent left knee instability 07/05/2018  . Respiratory failure with hypoxia ([place]) 08/30/2017  . Hypoxemia   . Heart failure with preserved ejection fraction ([place]), Grade 3 diastolic dysfunction 70/35/0093  . PAF (paroxysmal atrial fibrillation) ([place])   . Dyspnea 03/19/2017  . Encounter for preventive health examination 02/17/2016  . Sensorineural hearing loss (SNHL), bilateral 01/26/2016  . Morbid obesity ([place]) 06/17/2015  . Hypomagnesemia 04/24/2014  . Hemiparesis affecting left side as late effect of cerebrovascular accident ([place]) 04/24/2014  . Nontraumatic cerebral hemorrhage ([place]) 04/30/2012  .  DM (diabetes mellitus) with complications ([place]) 81/82/9937  . OSTEOPENIA 01/21/2009  . UNSPECIFIED VITAMIN D DEFICIENCY 11/19/2007  . ESSENTIAL HYPERTENSION, BENIGN 11/19/2007  . HYPERCHOLESTEROLEMIA 10/25/2006  . GASTROESOPHAGEAL REFLUX, NO ESOPHAGITIS 10/25/2006  . DIVERTICULOSIS OF COLON 10/25/2006  . Osteoarthritis 10/25/2006  . CERVICAL SPINE DISORDER, NOS 10/25/2006    Sumner Boast., PT  08/19/2020, 12:03 PM  [place]. [place], [place], [address] Phone: [phone]   Fax:  [phone]  Name: Karen Jackson MRN: [id number] Date of Birth: 1941/01/20

## 2020-08-23 ENCOUNTER — Telehealth: Payer: Self-pay

## 2020-08-23 ENCOUNTER — Ambulatory Visit: Payer: Medicare Other | Admitting: Occupational Therapy

## 2020-08-23 ENCOUNTER — Ambulatory Visit: Payer: Medicare Other | Admitting: Physical Therapy

## 2020-08-23 DIAGNOSIS — E118 Type 2 diabetes mellitus with unspecified complications: Secondary | ICD-10-CM

## 2020-08-23 NOTE — Telephone Encounter (Signed)
Patient calls nurse line

## 2020-08-23 NOTE — Telephone Encounter (Signed)
Patient calls nurse line regarding cold/ flu like symptoms. Offered to schedule patient appointment for tomorrow in [facility], however, patient states that she will go to urgent care for further evaluation.   Talbot Grumbling, RN

## 2020-08-25 ENCOUNTER — Ambulatory Visit: Payer: Medicare Other | Admitting: Occupational Therapy

## 2020-08-25 NOTE — Telephone Encounter (Signed)
Patient calls nurse line regarding urgent care visit from Monday. Patient was seen in [facility] in [place]. Patient reports receiving pneumonia diagnosis and was started on abx. Patient states that UC provider is recommending a chest x-ray.   Patient states that she will need an X-ray order sent to imaging center that has wheelchair accessibility.   Please advise   Veronda Prude, RN

## 2020-08-26 ENCOUNTER — Other Ambulatory Visit: Payer: Self-pay | Admitting: Family Medicine

## 2020-08-26 DIAGNOSIS — E7800 Pure hypercholesterolemia, unspecified: Secondary | ICD-10-CM

## 2020-08-26 DIAGNOSIS — I5030 Unspecified diastolic (congestive) heart failure: Secondary | ICD-10-CM

## 2020-08-26 DIAGNOSIS — K219 Gastro-esophageal reflux disease without esophagitis: Secondary | ICD-10-CM

## 2020-08-26 NOTE — Telephone Encounter (Signed)
Since Ms Radebaugh started on Antibiotic therapy, Chest Xray will not cchange current therapy.  Recommend that should she fail to show some improvement after 3 days of her antibiotics, or if she worsens, then re-evaluation by a physician would be appropriate.

## 2020-08-30 ENCOUNTER — Ambulatory Visit: Payer: Medicare Other | Admitting: Physical Therapy

## 2020-08-30 ENCOUNTER — Ambulatory Visit: Payer: Medicare Other | Admitting: Occupational Therapy

## 2020-08-30 MED ORDER — OZEMPIC (0.25 OR 0.5 MG/DOSE) 2 MG/1.5ML ~~LOC~~ SOPN: 0.5000 mg | PEN_INJECTOR | SUBCUTANEOUS | 3 refills | Status: DC

## 2020-08-30 NOTE — Telephone Encounter (Signed)
Patient calls nurse line requesting PCP opinion on another chest x-ray. Patient reports she is feeling much better and has almost completed medication course. Patient reports her provider at [facility] suggested she get a follow-up chest x-ray this week if PCP feels appropriate. Patient reports she is indifferent and will await PCP recommendation. Please advise.

## 2020-08-30 NOTE — Addendum Note (Signed)
Addended by: Moses Manners on: 08/30/2020 12:45 PM   Modules accepted: Orders

## 2020-08-30 NOTE — Telephone Encounter (Signed)
Answered questions.  Delayed CXR until after this covid surge.  Did not have an initial CXR.  Dx of pneumonia was based on clinical exam.

## 2020-09-03 ENCOUNTER — Other Ambulatory Visit: Payer: Self-pay

## 2020-09-03 ENCOUNTER — Ambulatory Visit: Payer: Medicare Other | Admitting: Occupational Therapy

## 2020-09-03 ENCOUNTER — Ambulatory Visit: Payer: Medicare Other | Attending: Family Medicine | Admitting: Physical Therapy

## 2020-09-03 ENCOUNTER — Encounter: Payer: Self-pay | Admitting: Physical Therapy

## 2020-09-03 ENCOUNTER — Encounter: Payer: Self-pay | Admitting: Occupational Therapy

## 2020-09-03 DIAGNOSIS — I6990 Unspecified sequelae of unspecified cerebrovascular disease: Secondary | ICD-10-CM | POA: Insufficient documentation

## 2020-09-03 DIAGNOSIS — R278 Other lack of coordination: Secondary | ICD-10-CM

## 2020-09-03 DIAGNOSIS — R293 Abnormal posture: Secondary | ICD-10-CM | POA: Insufficient documentation

## 2020-09-03 DIAGNOSIS — G8929 Other chronic pain: Secondary | ICD-10-CM | POA: Diagnosis not present

## 2020-09-03 DIAGNOSIS — I69354 Hemiplegia and hemiparesis following cerebral infarction affecting left non-dominant side: Secondary | ICD-10-CM | POA: Diagnosis not present

## 2020-09-03 DIAGNOSIS — M25512 Pain in left shoulder: Secondary | ICD-10-CM | POA: Diagnosis not present

## 2020-09-03 DIAGNOSIS — R262 Difficulty in walking, not elsewhere classified: Secondary | ICD-10-CM | POA: Diagnosis not present

## 2020-09-03 DIAGNOSIS — M6281 Muscle weakness (generalized): Secondary | ICD-10-CM | POA: Insufficient documentation

## 2020-09-03 DIAGNOSIS — R270 Ataxia, unspecified: Secondary | ICD-10-CM | POA: Diagnosis not present

## 2020-09-03 NOTE — Therapy (Signed)
Karen Jackson. [place], [place], [address] Phone: [phone]   Fax:  [phone]  Physical Therapy Treatment  Patient Details  Name: Karen Jackson MRN: [id number] Date of Birth: 01/21/1941 Referring Provider (PT): Hensel   Encounter Date: 09/03/2020   PT End of Session - 09/03/20 1148    Visit Number 22    Date for PT Re-Evaluation 09/13/20    PT Start Time 1059    PT Stop Time 1140    PT Time Calculation (min) 41 min    Activity Tolerance Patient limited by fatigue    Behavior During Therapy [facility] for tasks assessed/performed           Past Medical History:  Diagnosis Date  . Acute cystitis without hematuria   . Acute diastolic CHF (congestive heart failure) ([place])   . Arthritis   . Dyspnea   . Dysrhythmia   . Fever of unknown origin 03/19/2017  . Hyperlipidemia   . Hypertension    denies at preop  . Multifocal pneumonia   . Neuromuscular disorder ([place])    neuropathy left arm and foot  . Osteopenia   . Paralysis ([place])    partial left side from CVA   . Persistent atrial fibrillation ([place])   . PONV (postoperative nausea and vomiting)   . Pre-diabetes   . Stroke [facility]) 2013   hemmorahgic    Past Surgical History:  Procedure Laterality Date  . ANKLE SURGERY    . APPENDECTOMY    . CHOLECYSTECTOMY    . HERNIA REPAIR     Esophagus  . JOINT REPLACEMENT     total- right partial- left  . MASTECTOMY PARTIAL / LUMPECTOMY  2012   left  . ORIF ANKLE FRACTURE Left 07/20/2018   Procedure: OPEN REDUCTION INTERNAL FIXATION (ORIF) ANKLE FRACTURE;  Surgeon: Wylene Simmer, MD;  Location: [facility];  Service: Orthopedics;  Laterality: Left;  . TOTAL KNEE ARTHROPLASTY Left 01/27/2019   Procedure: TOTAL KNEE ARTHROPLASTY;  Surgeon: Vickey Huger, MD;  Location[facility];  Service: Orthopedics;  Laterality: Left;    There were no vitals filed for this visit.   Subjective Assessment - 09/03/20 1112    Subjective Patient  reports that she got sick December 25th, she was diagnosed with pneumonia.  She has not been out of the house in 2 weeks, she reports that she has not done much.  She feels weak and has had difficulty with walking and transfers    Currently in Pain? Yes                             [place] [facility] Adult PT Treatment/Exercise - 09/03/20 0001      Ambulation/Gait   Gait Comments 18' with CGA and FWW, then 100' with one rest and very short of breath, O2 saturation was 94%, but very short of breath and took a 4 minute recovery until breathing back to normal, then 60 feet and res      Knee/Hip Exercises: Aerobic   Nustep level 4 x 6.5 minutes 500 steps      Knee/Hip Exercises: Standing   Other Standing Knee Exercises standing weight shifts and marching      Knee/Hip Exercises: Seated   Other Seated Knee/Hip Exercises green tband ankle PF/DF, left hip adduction                    PT Short Term Goals -  06/18/20 1105      PT SHORT TERM GOAL #1   Title independent with initial HEP    Status Partially Met             PT Long Term Goals - 09/03/20 1151      PT LONG TERM GOAL #1   Title walk with hand hold assist x 100 feet    Status On-going      PT LONG TERM GOAL #2   Title increased right LE strength to 4-/5    Status On-going      PT LONG TERM GOAL #3   Title walk 100 feet with SBA using the FWW    Status Achieved      PT LONG TERM GOAL #4   Title transfer independently with set up    Status Partially Met                 Plan - 09/03/20 1149    Clinical Impression Statement Patient has been out and not been able to get out of the house due to pneumonia, she returns very weak, her first transfer with me was very poor with her leaning backwards and unable to get on top of her feet.  She was very short of breath with walking today and had a hard time with recovery    PT Next Visit Plan work on returning her to functional gait and recovering from  pneumonia    Consulted and Agree with Plan of Care Patient           Patient will benefit from skilled therapeutic intervention in order to improve the following deficits and impairments:  Abnormal gait,Cardiopulmonary status limiting activity,Decreased activity tolerance,Decreased balance,Decreased mobility,Decreased strength,Decreased coordination,Decreased range of motion,Difficulty walking,Increased muscle spasms,Pain  Visit Diagnosis: Hemiplegia and hemiparesis following cerebral infarction affecting left non-dominant side (HCC)  Ataxia  Left shoulder pain, unspecified chronicity  Muscle weakness (generalized)  Abnormal posture  Other lack of coordination  Difficulty in walking, not elsewhere classified  Late effects of CVA (cerebrovascular accident)  Ataxia, unspecified  Chronic left shoulder pain     Problem List Patient Active Problem List   Diagnosis Date Noted  . Presbycusis of both ears 03/08/2020  . Mixed stress and urge urinary incontinence 12/02/2019  . Macrocytosis 12/01/2019  . Nutritional anemia 12/01/2019  . S/P total knee replacement 01/27/2019  . Recurrent left knee instability 07/05/2018  . Respiratory failure with hypoxia ([place]) 08/30/2017  . Hypoxemia   . Heart failure with preserved ejection fraction ([place]), Grade 3 diastolic dysfunction 00/71/2197  . PAF (paroxysmal atrial fibrillation) ([place])   . Dyspnea 03/19/2017  . Encounter for preventive health examination 02/17/2016  . Sensorineural hearing loss (SNHL), bilateral 01/26/2016  . Morbid obesity ([place]) 06/17/2015  . Hypomagnesemia 04/24/2014  . Hemiparesis affecting left side as late effect of cerebrovascular accident ([place]) 04/24/2014  . Nontraumatic cerebral hemorrhage ([place]) 04/30/2012  . DM (diabetes mellitus) with complications ([place]) 58/83/2549  . OSTEOPENIA 01/21/2009  . UNSPECIFIED VITAMIN D DEFICIENCY 11/19/2007  . ESSENTIAL HYPERTENSION, BENIGN 11/19/2007  . HYPERCHOLESTEROLEMIA  10/25/2006  . GASTROESOPHAGEAL REFLUX, NO ESOPHAGITIS 10/25/2006  . DIVERTICULOSIS OF COLON 10/25/2006  . Osteoarthritis 10/25/2006  . CERVICAL SPINE DISORDER, NOS 10/25/2006    Sumner Boast., PT 09/03/2020, 11:52 AM  [place]. [place], [place], [address] Phone: [phone]   Fax:  [phone]  Name: Karen Jackson MRN: [id number] Date of Birth: [birth year]

## 2020-09-03 NOTE — Therapy (Signed)
[place]. [place], [place], [address] Phone: [phone]   Fax:  [phone]  Occupational Therapy Treatment  Patient Details  Name: Karen Jackson MRN: [id number] Date of Birth: 10-22-1940 Referring Provider (OT): Madison Hickman   Encounter Date: 09/03/2020   OT End of Session - 09/03/20 1209    Visit Number 3    Number of Visits 13    Date for OT Re-Evaluation 09/20/20    Authorization Type Medicare    Progress Note Due on Visit 10    OT Start Time 1015    OT Stop Time 1059    OT Time Calculation (min) 44 min    Activity Tolerance Patient tolerated treatment well;Patient limited by pain    Behavior During Therapy [facility] for tasks assessed/performed           Past Medical History:  Diagnosis Date  . Acute cystitis without hematuria   . Acute diastolic CHF (congestive heart failure) ([place])   . Arthritis   . Dyspnea   . Dysrhythmia   . Fever of unknown origin 03/19/2017  . Hyperlipidemia   . Hypertension    denies at preop  . Multifocal pneumonia   . Neuromuscular disorder ([place])    neuropathy left arm and foot  . Osteopenia   . Paralysis ([place])    partial left side from CVA   . Persistent atrial fibrillation ([place])   . PONV (postoperative nausea and vomiting)   . Pre-diabetes   . Stroke [facility]) 2013   hemmorahgic    Past Surgical History:  Procedure Laterality Date  . ANKLE SURGERY    . APPENDECTOMY    . CHOLECYSTECTOMY    . HERNIA REPAIR     Esophagus  . JOINT REPLACEMENT     total- right partial- left  . MASTECTOMY PARTIAL / LUMPECTOMY  2012   left  . ORIF ANKLE FRACTURE Left 07/20/2018   Procedure: OPEN REDUCTION INTERNAL FIXATION (ORIF) ANKLE FRACTURE;  Surgeon: Wylene Simmer, MD;  Location: [facility];  Service: Orthopedics;  Laterality: Left;  . TOTAL KNEE ARTHROPLASTY Left 01/27/2019   Procedure: TOTAL KNEE ARTHROPLASTY;  Surgeon: Vickey Huger, MD;  [facility];  Service: Orthopedics;   Laterality: Left;    There were no vitals filed for this visit.   Subjective Assessment - 09/03/20 1023    Subjective  Pt reports significant pain in LUE the past few days    Patient is accompanied by: --   Caregiver   Pertinent History CVA with L hemiparesis (2013), OA, HTN, CHF, pre-diabetes    Limitations Pain in LUE, impaired sensation, decreased strength and coordination, decreased PROM/AROM    Patient Stated Goals Improve functional use of LUE, decrease pain    Currently in Pain? Yes    Pain Score 8     Pain Location Shoulder    Pain Orientation Left    Pain Descriptors / Indicators Sharp;Aching    Pain Radiating Towards "from shoulder to elbow is the worst"    Pain Onset More than a month ago    Pain Frequency Constant    Aggravating Factors  cold weather    Pain Relieving Factors heat, Celebrex (daily)            OT Treatments/Exercises (OP) - 09/03/20 1024      Shoulder Exercises: Seated   External Rotation AAROM;Both;15 reps   alternating shoulder int/ext rotation with elbows flexed at sides; using UE to move contralateral shoulder into  external rotation and ipsilateral shoulder into internal rotation. Pt able to complete exercise through full ROM w/out L shoulder pain   Other Seated Exercises AAROM; BUE; 5x3   chest press w/ dowel (no weight); forearms pronated. Pain in L shoulder toward end of first set of 15, so OT decreased reps to 5 w/ breaks, which stopped pain. Significant tactile support provided under L elbow and to prevent L shoulder hiking throughout     Theraputty   Theraputty - Flatten Yellow putty; L hand; 5x. Using fingertips to push against disc of putty while spreading fingers apart   OT provided tactile support at wrist and hand to prevent extreme flexion of wrist and MCPs; v/c required to prevent PIP flexion. Pt reported discomfort at elbow level, which stopped w/ provision of wrist support   Theraputty - Grip Full gross grasp of putty; reshaping putty  between reps w/ ipsilateral hand; 15x1. Pulling putty apart w/ both hands; elbows flexed at sides with MCP flexion and DIP/PIP extension; 10x1   v/c required throughout to slow movements down and facilitate full grasp using all fingers; pt demonstrated ataxic movements of fingers that improved w/ repetition and decreased speed of movement   Theraputty - Pinch Pinching along putty w/ index, middle, and ring/little together; 10x each   v/c required to decrease speed of movements and increase accuracy; OT reshaped putty between repetitions and positioned putty to facilitate neutral LUE positioning     Modalities   Modalities Moist Heat   pt completed putty squeezes and pulling exercises while most heat applied to L shoulder     Moist Heat Therapy   Number Minutes Moist Heat 8 Minutes    Moist Heat Location Shoulder      Splinting   Splinting OT donned LUE brace for shoulder support at start of session             OT Education - 09/03/20 1208    Education Details Education on hand strengthening exercises for HEP; handout and yellow putty provided to pt    Person(s) Educated Patient    Methods Explanation;Demonstration;Handout    Comprehension Verbalized understanding;Returned demonstration             OT Long Term Goals - 08/17/20 0850      OT LONG TERM GOAL #1   Title Pt will demonstrate/verbalize understanding of compensation and joint protection strategies to decrease pain and increase independence during ADLs.    Baseline Currently Mod A with dressing; participation in ADLs limited by pain    Time 6    Period Weeks    Status On-going      OT LONG TERM GOAL #2   Title Pt will improve functional FM coordination of as evidenced by ability to place >50% of pegs into pegboard with LUE.    Baseline Unable to place small pegs with LUE during eval.    Time 6    Period Weeks    Status On-going      OT LONG TERM GOAL #3   Title Pt will be independent with HEP for BUE strengthening  and GM coordination to improve independence with functional tasks.    Baseline Pt reports no HEP for UE strength/coordination.    Time 6    Period Weeks    Status On-going      OT LONG TERM GOAL #4   Title Pt will improve functional grasp of L hand as evidenced by increasing grip strength by >9 lbs.  Baseline LUE = 11 lbs; RUE = 33 lbs    Time 6    Period Weeks    Status On-going             Plan - 09/03/20 1210    Clinical Impression Statement Pt has been sick with pneumonia last few weeks and reports feeling better, but does notice generalized weakness. Also reports pain in L shoulder has been significant last few days. OT applied shoulder support brace and moist heat to shoulder at start of session, which notably helped decrease pain. Pt was able to complete gentle AAROM exercises of bilateral shoulders with tolerable level of pain and through functional level of ROM. Increased dyspnea noted during hand strengthening and shoulder AAROM exericses, so rest breaks were implemented between reps and sets; dyspnea resolved quickly while resting.    OT Occupational Profile and History Problem Focused Assessment - Including review of records relating to presenting problem    Occupational performance deficits (Please refer to evaluation for details): ADL's;IADL's    Body Structure / Function / Physical Skills ADL;UE functional use;Balance;Body mechanics;Pain;FMC;Proprioception;ROM;Coordination;GMC;Sensation;Strength;Dexterity    Rehab Potential Fair    Clinical Decision Making Several treatment options, min-mod task modification necessary    Comorbidities Affecting Occupational Performance: May have comorbidities impacting occupational performance    Modification or Assistance to Complete Evaluation  Min-Moderate modification of tasks or assist with assess necessary to complete eval    OT Frequency 2x / week    OT Duration 6 weeks    OT Treatment/Interventions Self-care/ADL  training;Ultrasound;Compression bandaging;DME and/or AE instruction;Patient/family education;Passive range of motion;Electrical Stimulation;Splinting;Moist Heat;Therapeutic exercise;Manual Therapy;Therapeutic activities;Neuromuscular education;Energy conservation;Iontophoresis;Cryotherapy;Functional Mobility Training;Aquatic Therapy    Plan add to hand strengthening HEP; continue AAROM exercises for bilateral shoulders    Consulted and Agree with Plan of Care Patient           Patient will benefit from skilled therapeutic intervention in order to improve the following deficits and impairments:   Body Structure / Function / Physical Skills: ADL,UE functional use,Balance,Body mechanics,Pain,FMC,Proprioception,ROM,Coordination,GMC,Sensation,Strength,Dexterity       Visit Diagnosis: Hemiplegia and hemiparesis following cerebral infarction affecting left non-dominant side (HCC)  Ataxia  Left shoulder pain, unspecified chronicity  Muscle weakness (generalized)  Abnormal posture  Other lack of coordination    Problem List Patient Active Problem List   Diagnosis Date Noted  . Presbycusis of both ears 03/08/2020  . Mixed stress and urge urinary incontinence 12/02/2019  . Macrocytosis 12/01/2019  . Nutritional anemia 12/01/2019  . S/P total knee replacement 01/27/2019  . Recurrent left knee instability 07/05/2018  . Respiratory failure with hypoxia ([place]) 08/30/2017  . Hypoxemia   . Heart failure with preserved ejection fraction ([place]), Grade 3 diastolic dysfunction 27/01/2375  . PAF (paroxysmal atrial fibrillation) ([place])   . Dyspnea 03/19/2017  . Encounter for preventive health examination 02/17/2016  . Sensorineural hearing loss (SNHL), bilateral 01/26/2016  . Morbid obesity ([place]) 06/17/2015  . Hypomagnesemia 04/24/2014  . Hemiparesis affecting left side as late effect of cerebrovascular accident ([place]) 04/24/2014  . Nontraumatic cerebral hemorrhage ([place]) 04/30/2012  . DM  (diabetes mellitus) with complications ([place]) 28/31/5176  . OSTEOPENIA 01/21/2009  . UNSPECIFIED VITAMIN D DEFICIENCY 11/19/2007  . ESSENTIAL HYPERTENSION, BENIGN 11/19/2007  . HYPERCHOLESTEROLEMIA 10/25/2006  . GASTROESOPHAGEAL REFLUX, NO ESOPHAGITIS 10/25/2006  . DIVERTICULOSIS OF COLON 10/25/2006  . Osteoarthritis 10/25/2006  . CERVICAL SPINE DISORDER, NOS 10/25/2006    Kathrine Cords, OTR/L, MSOT 09/03/2020, 7:51 PM  [facility] [phone] [place], [place], [address] Phone: [phone]   Fax:  [phone]  Name: GENEVEA SIELOFF MRN: [id number] Date of Birth: 03-07-1941

## 2020-09-03 NOTE — Patient Instructions (Signed)
Access Code: [email or web address] URL: [email or web address] Date: 09/03/2020 Prepared by:  Exercises:  Putty Squeezes - 2-3 x daily - 1 set of 15 reps  Finger Pinch and Pull with Putty - 2-3 x daily - 1 set of 10 reps

## 2020-09-06 ENCOUNTER — Encounter: Payer: Self-pay | Admitting: Physical Therapy

## 2020-09-06 ENCOUNTER — Ambulatory Visit: Payer: Medicare Other | Admitting: Occupational Therapy

## 2020-09-06 ENCOUNTER — Encounter: Payer: Self-pay | Admitting: Occupational Therapy

## 2020-09-06 ENCOUNTER — Other Ambulatory Visit: Payer: Self-pay

## 2020-09-06 ENCOUNTER — Ambulatory Visit: Payer: Medicare Other | Admitting: Physical Therapy

## 2020-09-06 DIAGNOSIS — M6281 Muscle weakness (generalized): Secondary | ICD-10-CM

## 2020-09-06 DIAGNOSIS — R262 Difficulty in walking, not elsewhere classified: Secondary | ICD-10-CM

## 2020-09-06 DIAGNOSIS — R293 Abnormal posture: Secondary | ICD-10-CM

## 2020-09-06 DIAGNOSIS — I69354 Hemiplegia and hemiparesis following cerebral infarction affecting left non-dominant side: Secondary | ICD-10-CM

## 2020-09-06 DIAGNOSIS — R278 Other lack of coordination: Secondary | ICD-10-CM

## 2020-09-06 DIAGNOSIS — M25512 Pain in left shoulder: Secondary | ICD-10-CM | POA: Diagnosis not present

## 2020-09-06 DIAGNOSIS — I6990 Unspecified sequelae of unspecified cerebrovascular disease: Secondary | ICD-10-CM

## 2020-09-06 DIAGNOSIS — R270 Ataxia, unspecified: Secondary | ICD-10-CM

## 2020-09-06 NOTE — Therapy (Signed)
[address] [place], [place], [address] Phone: [phone]   Fax:  [phone]  Occupational Therapy Treatment  Patient Details  Name: Karen Jackson MRN: [id number] Date of Birth: [birth year] Referring Provider (OT): Madison Hickman   Encounter Date: 09/06/2020   OT End of Session - 09/06/20 1149    Visit Number 4    Number of Visits 13    Date for OT Re-Evaluation 09/20/20    Authorization Type Medicare    Progress Note Due on Visit 10    OT Start Time 1019   pt arrived late   OT Stop Time 1100    OT Time Calculation (min) 41 min    Activity Tolerance Patient tolerated treatment well;Patient limited by pain    Behavior During Therapy [facility] for tasks assessed/performed           Past Medical History:  Diagnosis Date  . Acute cystitis without hematuria   . Acute diastolic CHF (congestive heart failure) ([place])   . Arthritis   . Dyspnea   . Dysrhythmia   . Fever of unknown origin 03/19/2017  . Hyperlipidemia   . Hypertension    denies at preop  . Multifocal pneumonia   . Neuromuscular disorder ([place])    neuropathy left arm and foot  . Osteopenia   . Paralysis ([place])    partial left side from CVA   . Persistent atrial fibrillation ([place])   . PONV (postoperative nausea and vomiting)   . Pre-diabetes   . Stroke [facility]) 2013   hemmorahgic    Past Surgical History:  Procedure Laterality Date  . ANKLE SURGERY    . APPENDECTOMY    . CHOLECYSTECTOMY    . HERNIA REPAIR     Esophagus  . JOINT REPLACEMENT     total- right partial- left  . MASTECTOMY PARTIAL / LUMPECTOMY  2012   left  . ORIF ANKLE FRACTURE Left 07/20/2018   Procedure: OPEN REDUCTION INTERNAL FIXATION (ORIF) ANKLE FRACTURE;  Surgeon: Wylene Simmer, MD;  Location: [facility];  Service: Orthopedics;  Laterality: Left;  . TOTAL KNEE ARTHROPLASTY Left 01/27/2019   Procedure: TOTAL KNEE ARTHROPLASTY;  Surgeon: Vickey Huger, MD;  Location: [facility];  Service:  Orthopedics;  Laterality: Left;    Vitals:   09/06/20 1038  Pulse: 90  SpO2: 93%     Subjective Assessment - 09/06/20 1020    Subjective  Pain has been better    Patient is accompanied by: --   Caregiver   Pertinent History CVA with L hemiparesis (2013), OA, HTN, CHF, pre-diabetes    Limitations Pain in LUE, impaired sensation, decreased strength and coordination, decreased PROM/AROM    Patient Stated Goals Improve functional use of LUE, decrease pain    Currently in Pain? No/denies    Pain Location Shoulder    Pain Orientation Left    Pain Descriptors / Indicators Tingling    Pain Onset More than a month ago            OT Treatments/Exercises (OP) - 09/06/20 1121      Transfers   Comments Mod A SPT from w/c to therapy mat using RW      Shoulder Exercises: Seated   External Rotation AAROM;15 reps;Left   L shoulder int/ext rotation completed w/ hand skateboard at tabletop level; elbow slightly flexed. Pt able to complete exercise w/out pain. Movement at shoulder smooth w/ occasional catching.   Flexion AAROM;Left;10 reps   hand skateboard  used to facilitate flex/ext within pain-free ROM at tabletop level; pt able to reach ~70 degrees of shoulder flexion/elbow extension w/out pain. Increased pain noted with shoulder ext/returning shoulder to neutral.     Shoulder Exercises: ROM/Strengthening   Other ROM/Strengthening Exercises Therapist facilitated gentle PROM to L shoulder flexion and abduction; pt reported pain with abduction. Tightness and crepitus felt throughout.      Neurological Re-education Exercises   Seated with weight on hand Lateral weight shifting completed sitting on therapy mat; alternating L to R. Pt able to support weight on forearm to R side, unable to reach forearm level on L side w/out pain in shoulder.   tactile and verbal cues required to facilitate weight bearing through shoulder while shifting to L side; v/c needed to avoid pt holding her breath and  occasionally to return to midline between reps     Modalities   Modalities Moist Heat   pt completed pegboard activity while moist heat applied to L shoulder     Moist Heat Therapy   Number Minutes Moist Heat 10 Minutes    Moist Heat Location Shoulder      Splinting   Splinting OT donned LUE brace for shoulder support at start of session      Fine Motor Coordination (Hand/Wrist)   Manipulation of small objects Picked up pegs held by OT and placed them into large pegboard. Activity completed at level of therapy mat to decrease reach causing L shoulder pain and isolate [facility]. Min v/c required to slow pt down, which improved success. Pt able to place 60% of pegs w/out assist.   OT provided support at elbow and shoulder; significant crepitus of L shoulder throughout           OT Long Term Goals - 08/17/20 0850      OT LONG TERM GOAL #1   Title Pt will demonstrate/verbalize understanding of compensation and joint protection strategies to decrease pain and increase independence during ADLs.    Baseline Currently Mod A with dressing; participation in ADLs limited by pain    Time 6    Period Weeks    Status On-going      OT LONG TERM GOAL #2   Title Pt will improve functional FM coordination of as evidenced by ability to place >50% of pegs into pegboard with LUE.    Baseline Unable to place small pegs with LUE during eval.    Time 6    Period Weeks    Status On-going      OT LONG TERM GOAL #3   Title Pt will be independent with HEP for BUE strengthening and GM coordination to improve independence with functional tasks.    Baseline Pt reports no HEP for UE strength/coordination.    Time 6    Period Weeks    Status On-going      OT LONG TERM GOAL #4   Title Pt will improve functional grasp of L hand as evidenced by increasing grip strength by >9 lbs.    Baseline LUE = 11 lbs; RUE = 33 lbs    Time 6    Period Weeks    Status On-going             Plan - 09/06/20 1153     Clinical Impression Statement OT applied shoulder support brace and moist heat to shoulder at start of session, which pt reports helps decrease pain. Pt was able to complete gentle AAROM exercises of bilateral shoulders with tolerable level  of pain and through functional level of ROM. Ataxia notable with FM exercises, which improves when pt is cued to slow down. Increased dyspnea during therapeutic exericses compared with last session, so rest breaks were implemented between reps and sets; dyspnea resolved quickly during rest and SpO2 recorded at 93%, which pt reports is typical. Pt continues to have a lingering cough.    OT Occupational Profile and History Problem Focused Assessment - Including review of records relating to presenting problem    Occupational performance deficits (Please refer to evaluation for details): ADL's;IADL's    Body Structure / Function / Physical Skills ADL;UE functional use;Balance;Body mechanics;Pain;FMC;Proprioception;ROM;Coordination;GMC;Sensation;Strength;Dexterity    Rehab Potential Fair    Clinical Decision Making Several treatment options, min-mod task modification necessary    Comorbidities Affecting Occupational Performance: May have comorbidities impacting occupational performance    Modification or Assistance to Complete Evaluation  Min-Moderate modification of tasks or assist with assess necessary to complete eval    OT Frequency 2x / week    OT Duration 6 weeks    OT Treatment/Interventions Self-care/ADL training;Ultrasound;Compression bandaging;DME and/or AE instruction;Patient/family education;Passive range of motion;Electrical Stimulation;Splinting;Moist Heat;Therapeutic exercise;Manual Therapy;Therapeutic activities;Neuromuscular education;Energy conservation;Iontophoresis;Cryotherapy;Functional Mobility Training;Aquatic Therapy    Plan continue AAROM/PROM for L shoulder; continue hand strengthening and FMC exercises    Consulted and Agree with Plan of Care  Patient           Patient will benefit from skilled therapeutic intervention in order to improve the following deficits and impairments:   Body Structure / Function / Physical Skills: ADL,UE functional use,Balance,Body mechanics,Pain,FMC,Proprioception,ROM,Coordination,GMC,Sensation,Strength,Dexterity       Visit Diagnosis: Hemiplegia and hemiparesis following cerebral infarction affecting left non-dominant side (HCC)  Ataxia  Muscle weakness (generalized)  Abnormal posture  Left shoulder pain, unspecified chronicity  Other lack of coordination    Problem List Patient Active Problem List   Diagnosis Date Noted  . Presbycusis of both ears 03/08/2020  . Mixed stress and urge urinary incontinence 12/02/2019  . Macrocytosis 12/01/2019  . Nutritional anemia 12/01/2019  . S/P total knee replacement 01/27/2019  . Recurrent left knee instability 07/05/2018  . Respiratory failure with hypoxia ([place]) 08/30/2017  . Hypoxemia   . Heart failure with preserved ejection fraction ([place]), Grade 3 diastolic dysfunction [id number]  . PAF (paroxysmal atrial fibrillation) ([place])   . Dyspnea 03/19/2017  . Encounter for preventive health examination 02/17/2016  . Sensorineural hearing loss (SNHL), bilateral 01/26/2016  . Morbid obesity ([place]) 06/17/2015  . Hypomagnesemia 04/24/2014  . Hemiparesis affecting left side as late effect of cerebrovascular accident ([place]) 04/24/2014  . Nontraumatic cerebral hemorrhage ([place]) 04/30/2012  . DM (diabetes mellitus) with complications ([place]) 0000000  . OSTEOPENIA 01/21/2009  . UNSPECIFIED VITAMIN D DEFICIENCY 11/19/2007  . ESSENTIAL HYPERTENSION, BENIGN 11/19/2007  . HYPERCHOLESTEROLEMIA 10/25/2006  . GASTROESOPHAGEAL REFLUX, NO ESOPHAGITIS 10/25/2006  . DIVERTICULOSIS OF COLON 10/25/2006  . Osteoarthritis 10/25/2006  . CERVICAL SPINE DISORDER, NOS 10/25/2006    Kathrine Cords, OTR/L, MSOT 09/06/2020, 11:59 AM  [address]. [place], [place], [address] Phone: [phone]   Fax:  [phone]  Name: Karen Jackson MRN: [id number] Date of Birth: 20-Oct-1940

## 2020-09-06 NOTE — Therapy (Signed)
[address]. [place], [place], [address] Phone: [phone]   Fax:  [phone]  Physical Therapy Treatment  Patient Details  Name: Karen Jackson MRN: [id number] Date of Birth: 1940-10-09 Referring Provider (PT): H[place]   Encounter Date: 09/06/2020   PT End of Session - 09/06/20 1144    Visit Number 23    Date for PT Re-Evaluation 09/13/20    PT Start Time 1059    PT Stop Time 1141    PT Time Calculation (min) 42 min    Activity Tolerance Patient limited by fatigue    Behavior During Therapy [facility] for tasks assessed/performed           Past Medical History:  Diagnosis Date  . Acute cystitis without hematuria   . Acute diastolic CHF (congestive heart failure) ([place])   . Arthritis   . Dyspnea   . Dysrhythmia   . Fever of unknown origin 03/19/2017  . Hyperlipidemia   . Hypertension    denies at preop  . Multifocal pneumonia   . Neuromuscular disorder ([place])    neuropathy left arm and foot  . Osteopenia   . Paralysis ([place])    partial left side from CVA   . Persistent atrial fibrillation ([place])   . PONV (postoperative nausea and vomiting)   . Pre-diabetes   . Stroke [facility]) 2013   hemmorahgic    Past Surgical History:  Procedure Laterality Date  . ANKLE SURGERY    . APPENDECTOMY    . CHOLECYSTECTOMY    . HERNIA REPAIR     Esophagus  . JOINT REPLACEMENT     total- right partial- left  . MASTECTOMY PARTIAL / LUMPECTOMY  2012   left  . ORIF ANKLE FRACTURE Left 07/20/2018   Procedure: OPEN REDUCTION INTERNAL FIXATION (ORIF) ANKLE FRACTURE;  Surgeon: Wylene Simmer, MD;  Location: [facility];  Service: Orthopedics;  Laterality: Left;  . TOTAL KNEE ARTHROPLASTY Left 01/27/2019   Procedure: TOTAL KNEE ARTHROPLASTY;  Surgeon: Vickey Huger, MD;  [facility];  Service: Orthopedics;  Laterality: Left;    There were no vitals filed for this visit.   Subjective Assessment - 09/06/20 1103    Subjective Patient  reports that she is still struggling with her cold, short of breath at times, )2 saturation to start was 93%    Currently in Pain? No/denies                             [facility] Adult PT Treatment/Exercise - 09/06/20 0001      Ambulation/Gait   Gait Comments 70' x 2 with CGA using the FWW, 100' x 1, very short of breath O2 at 88% after the longer walk      High Level Balance   High Level Balance Comments in parallel bars side stepping, then work on posture, 4" toe touches, 4" toe touches usingthe FWW      Knee/Hip Exercises: Aerobic   Nustep level 5 x 6.5 minutes 500 steps      Knee/Hip Exercises: Seated   Other Seated Knee/Hip Exercises green tband ankle PF/DF, left hip adduction    Hamstring Curl Both;3 sets;10 reps    Hamstring Limitations green tband                    PT Short Term Goals - 06/18/20 1105      PT SHORT TERM GOAL #1  Title independent with initial HEP    Status Partially Met             PT Long Term Goals - 09/03/20 1151      PT LONG TERM GOAL #1   Title walk with hand hold assist x 100 feet    Status On-going      PT LONG TERM GOAL #2   Title increased right LE strength to 4-/5    Status On-going      PT LONG TERM GOAL #3   Title walk 100 feet with SBA using the FWW    Status Achieved      PT LONG TERM GOAL #4   Title transfer independently with set up    Status Partially Met                 Plan - 09/06/20 1144    Clinical Impression Statement Patient is struggling with getting over her pneumonia, she is short of breath with exertion, and you can really see her struggle to get oxygem in.  She reports that she can really see how she declined over the past few weeks without PT.  The side stepping really exhausted her as it is somehting that she does not do much except for a few steps in and out of the shower.    PT Next Visit Plan work on returning her to functional gait and recovering from pneumonia     Consulted and Agree with Plan of Care Patient           Patient will benefit from skilled therapeutic intervention in order to improve the following deficits and impairments:  Abnormal gait,Cardiopulmonary status limiting activity,Decreased activity tolerance,Decreased balance,Decreased mobility,Decreased strength,Decreased coordination,Decreased range of motion,Difficulty walking,Increased muscle spasms,Pain  Visit Diagnosis: Hemiplegia and hemiparesis following cerebral infarction affecting left non-dominant side (HCC)  Ataxia  Muscle weakness (generalized)  Abnormal posture  Difficulty in walking, not elsewhere classified  Late effects of CVA (cerebrovascular accident)  Ataxia, unspecified     Problem List Patient Active Problem List   Diagnosis Date Noted  . Presbycusis of both ears 03/08/2020  . Mixed stress and urge urinary incontinence 12/02/2019  . Macrocytosis 12/01/2019  . Nutritional anemia 12/01/2019  . S/P total knee replacement 01/27/2019  . Recurrent left knee instability 07/05/2018  . Respiratory failure with hypoxia ([place]) 08/30/2017  . Hypoxemia   . Heart failure with preserved ejection fraction ([place]), Grade 3 diastolic dysfunction 30/02/6225  . PAF (paroxysmal atrial fibrillation) ([place])   . Dyspnea 03/19/2017  . Encounter for preventive health examination 02/17/2016  . Sensorineural hearing loss (SNHL), bilateral 01/26/2016  . Morbid obesity ([place]) 06/17/2015  . Hypomagnesemia 04/24/2014  . Hemiparesis affecting left side as late effect of cerebrovascular accident ([place]) 04/24/2014  . Nontraumatic cerebral hemorrhage ([place]) 04/30/2012  . DM (diabetes mellitus) with complications ([place]) 33/35/4562  . OSTEOPENIA 01/21/2009  . UNSPECIFIED VITAMIN D DEFICIENCY 11/19/2007  . ESSENTIAL HYPERTENSION, BENIGN 11/19/2007  . HYPERCHOLESTEROLEMIA 10/25/2006  . GASTROESOPHAGEAL REFLUX, NO ESOPHAGITIS 10/25/2006  . DIVERTICULOSIS OF COLON 10/25/2006  .  Osteoarthritis 10/25/2006  . CERVICAL SPINE DISORDER, NOS 10/25/2006    Sumner Boast., PT 09/06/2020, 11:48 AM  [address]. [place], [place], [address] Phone: [phone]   Fax:  [phone]  Name: Karen Jackson MRN: [id number] Date of Birth: 1941-01-26

## 2020-09-09 ENCOUNTER — Encounter: Payer: Self-pay | Admitting: Occupational Therapy

## 2020-09-09 ENCOUNTER — Encounter: Payer: Self-pay | Admitting: Physical Therapy

## 2020-09-09 ENCOUNTER — Ambulatory Visit: Payer: Medicare Other | Admitting: Physical Therapy

## 2020-09-09 ENCOUNTER — Ambulatory Visit: Payer: Medicare Other | Admitting: Occupational Therapy

## 2020-09-09 ENCOUNTER — Other Ambulatory Visit: Payer: Self-pay

## 2020-09-09 DIAGNOSIS — R293 Abnormal posture: Secondary | ICD-10-CM

## 2020-09-09 DIAGNOSIS — R278 Other lack of coordination: Secondary | ICD-10-CM | POA: Diagnosis not present

## 2020-09-09 DIAGNOSIS — M25512 Pain in left shoulder: Secondary | ICD-10-CM

## 2020-09-09 DIAGNOSIS — R270 Ataxia, unspecified: Secondary | ICD-10-CM | POA: Diagnosis not present

## 2020-09-09 DIAGNOSIS — M6281 Muscle weakness (generalized): Secondary | ICD-10-CM

## 2020-09-09 DIAGNOSIS — I69354 Hemiplegia and hemiparesis following cerebral infarction affecting left non-dominant side: Secondary | ICD-10-CM | POA: Diagnosis not present

## 2020-09-09 DIAGNOSIS — I6990 Unspecified sequelae of unspecified cerebrovascular disease: Secondary | ICD-10-CM

## 2020-09-09 DIAGNOSIS — R262 Difficulty in walking, not elsewhere classified: Secondary | ICD-10-CM

## 2020-09-09 NOTE — Therapy (Signed)
[place]. [place], [place], [address] Phone: [phone]   Fax:  [phone]  Occupational Therapy Treatment  Patient Details  Name: Karen Jackson MRN: [id number] Date of Birth: [birth year] Referring Provider (OT): Madison Hickman   Encounter Date: 09/09/2020   OT End of Session - 09/09/20 1654    Visit Number 5    Number of Visits 13    Date for OT Re-Evaluation 09/20/20    Authorization Type Medicare    Progress Note Due on Visit 10    OT Start Time 1016    OT Stop Time 1059    OT Time Calculation (min) 43 min    Activity Tolerance Patient tolerated treatment well;Patient limited by pain    Behavior During Therapy [facility] for tasks assessed/performed           Past Medical History:  Diagnosis Date  . Acute cystitis without hematuria   . Acute diastolic CHF (congestive heart failure) ([place])   . Arthritis   . Dyspnea   . Dysrhythmia   . Fever of unknown origin 03/19/2017  . Hyperlipidemia   . Hypertension    denies at preop  . Multifocal pneumonia   . Neuromuscular disorder ([place])    neuropathy left arm and foot  . Osteopenia   . Paralysis ([place])    partial left side from CVA   . Persistent atrial fibrillation ([place])   . PONV (postoperative nausea and vomiting)   . Pre-diabetes   . Stroke [facility]) 2013   hemmorahgic    Past Surgical History:  Procedure Laterality Date  . ANKLE SURGERY    . APPENDECTOMY    . CHOLECYSTECTOMY    . HERNIA REPAIR     Esophagus  . JOINT REPLACEMENT     total- right partial- left  . MASTECTOMY PARTIAL / LUMPECTOMY  2012   left  . ORIF ANKLE FRACTURE Left 07/20/2018   Procedure: OPEN REDUCTION INTERNAL FIXATION (ORIF) ANKLE FRACTURE;  Surgeon: Wylene Simmer, MD;  Location: [facility];  Service: Orthopedics;  Laterality: Left;  . TOTAL KNEE ARTHROPLASTY Left 01/27/2019   Procedure: TOTAL KNEE ARTHROPLASTY;  Surgeon: Vickey Huger, MD;  [facility];  Service: Orthopedics;   Laterality: Left;    There were no vitals filed for this visit.   Subjective Assessment - 09/09/20 1055    Subjective  "same old, same old, but the shoulder brace definitely helps"    Patient is accompanied by: --   Caregiver   Pertinent History CVA with L hemiparesis (2013), OA, HTN, CHF, pre-diabetes    Limitations Pain in LUE, impaired sensation, decreased strength and coordination, decreased PROM/AROM    Patient Stated Goals Improve functional use of LUE, decrease pain    Currently in Pain? No/denies    Pain Onset More than a month ago            OT Treatments/Exercises (OP) - 09/09/20 1748      Shoulder Exercises: ROM/Strengthening   Ranger Shoulder flex/ext, internal/external rotation, and circumduction completed using UE Ranger 10x. Pt able to move through flex/ext within tolerable level of pain, but reported increased pain during second set of rotation and circumduction; OT provided significant support at elbow and shoulder, which initially resolved pain, but returned after a few reps and exercises were discontinued.      Modalities   Modalities Moist Heat   participated in Connect 4 activity while moist heat applied to L shoulder  Moist Heat Therapy   Number Minutes Moist Heat 10 Minutes    Moist Heat Location Shoulder      Splinting   Splinting OT donned LUE brace for shoulder support at start of session      Fine Motor Coordination (Hand/Wrist)   Manipulation of small objects Used L hand to retrieve Connect 4 pieces out of bowl held at elbow height and then place them in game stand. Pt required v/c cues to slow movements down when retrieving game pieces due to ataxia. Able to place pieces in stand with 80% accuracy.   played for ~15 min           OT Long Term Goals - 08/17/20 0850      OT LONG TERM GOAL #1   Title Pt will demonstrate/verbalize understanding of compensation and joint protection strategies to decrease pain and increase independence during ADLs.     Baseline Currently Mod A with dressing; participation in ADLs limited by pain    Time 6    Period Weeks    Status On-going      OT LONG TERM GOAL #2   Title Pt will improve functional FM coordination of as evidenced by ability to place >50% of pegs into pegboard with LUE.    Baseline Unable to place small pegs with LUE during eval.    Time 6    Period Weeks    Status On-going      OT LONG TERM GOAL #3   Title Pt will be independent with HEP for BUE strengthening and GM coordination to improve independence with functional tasks.    Baseline Pt reports no HEP for UE strength/coordination.    Time 6    Period Weeks    Status On-going      OT LONG TERM GOAL #4   Title Pt will improve functional grasp of L hand as evidenced by increasing grip strength by >9 lbs.    Baseline LUE = 11 lbs; RUE = 33 lbs    Time 6    Period Weeks    Status On-going            Plan - 09/09/20 1803    Clinical Impression Statement OT applied shoulder support brace and moist heat to shoulder at start of session for pain management. Mod ataxia with FM exercises, which improves when pt is cued to slow down. Increased dyspnea during therapeutic exericses continued this session and pt reported mild shortness of breath that has been present for a few days (reports she will be following up with physician); rest breaks were implemented between reps and sets. Shoulder pain significantly limiting during shoulder ROM exercises despite external supports provided.    OT Occupational Profile and History Problem Focused Assessment - Including review of records relating to presenting problem    Occupational performance deficits (Please refer to evaluation for details): ADL's;IADL's    Body Structure / Function / Physical Skills ADL;UE functional use;Balance;Body mechanics;Pain;FMC;Proprioception;ROM;Coordination;GMC;Sensation;Strength;Dexterity    Rehab Potential Fair    Clinical Decision Making Several treatment  options, min-mod task modification necessary    Comorbidities Affecting Occupational Performance: May have comorbidities impacting occupational performance    Modification or Assistance to Complete Evaluation  Min-Moderate modification of tasks or assist with assess necessary to complete eval    OT Frequency 2x / week    OT Duration 6 weeks    OT Treatment/Interventions Self-care/ADL training;Ultrasound;Compression bandaging;DME and/or AE instruction;Patient/family education;Passive range of motion;Electrical Stimulation;Splinting;Moist Heat;Therapeutic exercise;Manual Therapy;Therapeutic activities;Neuromuscular education;Energy  conservation;Iontophoresis;Cryotherapy;Functional Mobility Training;Aquatic Therapy    Plan continue with PoC    Consulted and Agree with Plan of Care Patient           Patient will benefit from skilled therapeutic intervention in order to improve the following deficits and impairments:   Body Structure / Function / Physical Skills: ADL,UE functional use,Balance,Body mechanics,Pain,FMC,Proprioception,ROM,Coordination,GMC,Sensation,Strength,Dexterity       Visit Diagnosis: Hemiplegia and hemiparesis following cerebral infarction affecting left non-dominant side (HCC)  Ataxia  Muscle weakness (generalized)  Left shoulder pain, unspecified chronicity  Other lack of coordination  Abnormal posture    Problem List Patient Active Problem List   Diagnosis Date Noted  . Presbycusis of both ears 03/08/2020  . Mixed stress and urge urinary incontinence 12/02/2019  . Macrocytosis 12/01/2019  . Nutritional anemia 12/01/2019  . S/P total knee replacement 01/27/2019  . Recurrent left knee instability 07/05/2018  . Respiratory failure with hypoxia ([place]) 08/30/2017  . Hypoxemia   . Heart failure with preserved ejection fraction ([place]), Grade 3 diastolic dysfunction 91/63/8466  . PAF (paroxysmal atrial fibrillation) ([place])   . Dyspnea 03/19/2017  . Encounter for  preventive health examination 02/17/2016  . Sensorineural hearing loss (SNHL), bilateral 01/26/2016  . Morbid obesity ([place]) 06/17/2015  . Hypomagnesemia 04/24/2014  . Hemiparesis affecting left side as late effect of cerebrovascular accident ([place]) 04/24/2014  . Nontraumatic cerebral hemorrhage ([place]) 04/30/2012  . DM (diabetes mellitus) with complications ([place]) 59/93/5701  . OSTEOPENIA 01/21/2009  . UNSPECIFIED VITAMIN D DEFICIENCY 11/19/2007  . ESSENTIAL HYPERTENSION, BENIGN 11/19/2007  . HYPERCHOLESTEROLEMIA 10/25/2006  . GASTROESOPHAGEAL REFLUX, NO ESOPHAGITIS 10/25/2006  . DIVERTICULOSIS OF COLON 10/25/2006  . Osteoarthritis 10/25/2006  . CERVICAL SPINE DISORDER, NOS 10/25/2006     Kathrine Cords, OTR/L, MSOT 09/09/2020, 6:09 PM  [place]. [place], [place], [address] Phone: [phone]   Fax:  [phone]  Name: Karen Jackson MRN: [id number] Date of Birth: 1941/04/29

## 2020-09-09 NOTE — Therapy (Signed)
[place]. [place], [place], [address] Phone: [phone]   Fax:  [phone]  Physical Therapy Treatment  Patient Details  Name: Karen Jackson MRN: [id number] Date of Birth: [birth year] Referring Provider (PT): [place]   Encounter Date: 09/09/2020   PT End of Session - 09/09/20 1144    Visit Number 24    Date for PT Re-Evaluation 09/13/20    PT Start Time 1059    PT Stop Time 1140    PT Time Calculation (min) 41 min    Activity Tolerance Patient limited by fatigue    Behavior During Therapy [facility] for tasks assessed/performed           Past Medical History:  Diagnosis Date  . Acute cystitis without hematuria   . Acute diastolic CHF (congestive heart failure) ([place])   . Arthritis   . Dyspnea   . Dysrhythmia   . Fever of unknown origin 03/19/2017  . Hyperlipidemia   . Hypertension    denies at preop  . Multifocal pneumonia   . Neuromuscular disorder ([place])    neuropathy left arm and foot  . Osteopenia   . Paralysis ([place])    partial left side from CVA   . Persistent atrial fibrillation ([place])   . PONV (postoperative nausea and vomiting)   . Pre-diabetes   . Stroke [facility]) 2013   hemmorahgic    Past Surgical History:  Procedure Laterality Date  . ANKLE SURGERY    . APPENDECTOMY    . CHOLECYSTECTOMY    . HERNIA REPAIR     Esophagus  . JOINT REPLACEMENT     total- right partial- left  . MASTECTOMY PARTIAL / LUMPECTOMY  2012   left  . ORIF ANKLE FRACTURE Left 07/20/2018   Procedure: OPEN REDUCTION INTERNAL FIXATION (ORIF) ANKLE FRACTURE;  Surgeon: Wylene Simmer, MD;  Location: [facility];  Service: Orthopedics;  Laterality: Left;  . TOTAL KNEE ARTHROPLASTY Left 01/27/2019   Procedure: TOTAL KNEE ARTHROPLASTY;  Surgeon: Vickey Huger, MD;  [facility];  Service: Orthopedics;  Laterality: Left;    There were no vitals filed for this visit.   Subjective Assessment - 09/09/20 1125    Subjective Patient  continues to strugglw ith her breathing as she is getting over pneumonia.  She has a significant productive cough.    Currently in Pain? No/denies                             [facility] Adult PT Treatment/Exercise - 09/09/20 0001      Ambulation/Gait   Gait Comments 75' x 2 FWW, CGA      Knee/Hip Exercises: Aerobic   Nustep level 4 x 7 minutes 500 steps      Knee/Hip Exercises: Standing   Other Standing Knee Exercises 4" toe touches      Knee/Hip Exercises: Seated   Long Arc Quad Both;3 sets;10 reps    Long Arc Quad Weight 5 lbs.    Other Seated Knee/Hip Exercises 5# marches    Other Seated Knee/Hip Exercises green tband ankle PF/DF, left hip adduction, ball squeeze b/n knees, physio ball in lap isometric abs    Hamstring Curl Both;3 sets;10 reps                    PT Short Term Goals - 06/18/20 1105      PT SHORT TERM GOAL #1   Title independent  with initial HEP    Status Partially Met             PT Long Term Goals - 09/09/20 1146      PT LONG TERM GOAL #2   Title increased right LE strength to 4-/5    Status On-going      PT LONG TERM GOAL #3   Title walk 100 feet with SBA using the FWW    Status Partially Met                 Plan - 09/09/20 1144    Clinical Impression Statement Continues to struggle with breathing and a productive cough, she is SOB with exertion, O2 saturation is 92-94% without oxygen.  She needs CGA for walking due to balance and cues for posture and to decresae the shuffling steps    PT Next Visit Plan work on returning her to functional gait and recovering from pneumonia    Consulted and Agree with Plan of Care Patient           Patient will benefit from skilled therapeutic intervention in order to improve the following deficits and impairments:  Abnormal gait,Cardiopulmonary status limiting activity,Decreased activity tolerance,Decreased balance,Decreased mobility,Decreased strength,Decreased  coordination,Decreased range of motion,Difficulty walking,Increased muscle spasms,Pain  Visit Diagnosis: Hemiplegia and hemiparesis following cerebral infarction affecting left non-dominant side (HCC)  Ataxia  Muscle weakness (generalized)  Abnormal posture  Difficulty in walking, not elsewhere classified  Late effects of CVA (cerebrovascular accident)     Problem List Patient Active Problem List   Diagnosis Date Noted  . Presbycusis of both ears 03/08/2020  . Mixed stress and urge urinary incontinence 12/02/2019  . Macrocytosis 12/01/2019  . Nutritional anemia 12/01/2019  . S/P total knee replacement 01/27/2019  . Recurrent left knee instability 07/05/2018  . Respiratory failure with hypoxia ([place]) 08/30/2017  . Hypoxemia   . Heart failure with preserved ejection fraction ([place]), Grade 3 diastolic dysfunction 45/62/5638  . PAF (paroxysmal atrial fibrillation) ([place])   . Dyspnea 03/19/2017  . Encounter for preventive health examination 02/17/2016  . Sensorineural hearing loss (SNHL), bilateral 01/26/2016  . Morbid obesity ([place]) 06/17/2015  . Hypomagnesemia 04/24/2014  . Hemiparesis affecting left side as late effect of cerebrovascular accident ([place]) 04/24/2014  . Nontraumatic cerebral hemorrhage ([place]) 04/30/2012  . DM (diabetes mellitus) with complications ([place]) 93/73/4287  . OSTEOPENIA 01/21/2009  . UNSPECIFIED VITAMIN D DEFICIENCY 11/19/2007  . ESSENTIAL HYPERTENSION, BENIGN 11/19/2007  . HYPERCHOLESTEROLEMIA 10/25/2006  . GASTROESOPHAGEAL REFLUX, NO ESOPHAGITIS 10/25/2006  . DIVERTICULOSIS OF COLON 10/25/2006  . Osteoarthritis 10/25/2006  . CERVICAL SPINE DISORDER, NOS 10/25/2006    Sumner Boast., PT 09/09/2020, 11:47 AM  [place]. [place], [place], [address] Phone: [phone]   Fax:  [phone]  Name: IVAN MASKELL MRN: [id number] Date of Birth: February 13, 1941

## 2020-09-13 ENCOUNTER — Ambulatory Visit: Payer: Medicare Other | Admitting: Physical Therapy

## 2020-09-17 ENCOUNTER — Other Ambulatory Visit: Payer: Self-pay

## 2020-09-17 ENCOUNTER — Ambulatory Visit: Payer: Medicare Other | Admitting: Physical Therapy

## 2020-09-17 ENCOUNTER — Ambulatory Visit: Payer: Medicare Other | Admitting: Occupational Therapy

## 2020-09-17 ENCOUNTER — Encounter: Payer: Self-pay | Admitting: Physical Therapy

## 2020-09-17 ENCOUNTER — Encounter: Payer: Self-pay | Admitting: Occupational Therapy

## 2020-09-17 ENCOUNTER — Other Ambulatory Visit: Payer: Self-pay | Admitting: Family Medicine

## 2020-09-17 DIAGNOSIS — I6990 Unspecified sequelae of unspecified cerebrovascular disease: Secondary | ICD-10-CM

## 2020-09-17 DIAGNOSIS — R270 Ataxia, unspecified: Secondary | ICD-10-CM | POA: Diagnosis not present

## 2020-09-17 DIAGNOSIS — I69354 Hemiplegia and hemiparesis following cerebral infarction affecting left non-dominant side: Secondary | ICD-10-CM | POA: Diagnosis not present

## 2020-09-17 DIAGNOSIS — R278 Other lack of coordination: Secondary | ICD-10-CM | POA: Diagnosis not present

## 2020-09-17 DIAGNOSIS — R293 Abnormal posture: Secondary | ICD-10-CM

## 2020-09-17 DIAGNOSIS — M25512 Pain in left shoulder: Secondary | ICD-10-CM

## 2020-09-17 DIAGNOSIS — R262 Difficulty in walking, not elsewhere classified: Secondary | ICD-10-CM

## 2020-09-17 DIAGNOSIS — M6281 Muscle weakness (generalized): Secondary | ICD-10-CM

## 2020-09-17 NOTE — Therapy (Signed)
[place]. [place], [place], [address] Phone: [phone]   Fax:  [phone]  Occupational Therapy Treatment  Patient Details  Name: Karen Jackson MRN: [id number] Date of Birth: [birth year] Referring Provider (OT): Madison Hickman   Encounter Date: 09/17/2020   OT End of Session - 09/17/20 1137    Visit Number 6    Number of Visits 13    Date for OT Re-Evaluation 10/01/20    Authorization Type Medicare    Progress Note Due on Visit 10    OT Start Time 1018    OT Stop Time 1100    OT Time Calculation (min) 42 min    Activity Tolerance Patient tolerated treatment well    Behavior During Therapy [facility] for tasks assessed/performed           Past Medical History:  Diagnosis Date  . Acute cystitis without hematuria   . Acute diastolic CHF (congestive heart failure) ([place])   . Arthritis   . Dyspnea   . Dysrhythmia   . Fever of unknown origin 03/19/2017  . Hyperlipidemia   . Hypertension    denies at preop  . Multifocal pneumonia   . Neuromuscular disorder ([place])    neuropathy left arm and foot  . Osteopenia   . Paralysis ([place])    partial left side from CVA   . Persistent atrial fibrillation ([place])   . PONV (postoperative nausea and vomiting)   . Pre-diabetes   . Stroke [facility]) 2013   hemmorahgic    Past Surgical History:  Procedure Laterality Date  . ANKLE SURGERY    . APPENDECTOMY    . CHOLECYSTECTOMY    . HERNIA REPAIR     Esophagus  . JOINT REPLACEMENT     total- right partial- left  . MASTECTOMY PARTIAL / LUMPECTOMY  2012   left  . ORIF ANKLE FRACTURE Left 07/20/2018   Procedure: OPEN REDUCTION INTERNAL FIXATION (ORIF) ANKLE FRACTURE;  Surgeon: Wylene Simmer, MD;  Location: [facility];  Service: Orthopedics;  Laterality: Left;  . TOTAL KNEE ARTHROPLASTY Left 01/27/2019   Procedure: TOTAL KNEE ARTHROPLASTY;  Surgeon: Vickey Huger, MD;  [facility];  Service: Orthopedics;  Laterality: Left;     There were no vitals filed for this visit.   Subjective Assessment - 09/17/20 1019    Subjective  "The pain in my shoulder is still there, but it's less intense"    Patient is accompanied by: --    Pertinent History CVA with L hemiparesis (2013), OA, HTN, CHF, pre-diabetes    Limitations Pain in LUE, impaired sensation, decreased strength and coordination, decreased PROM/AROM    Patient Stated Goals Improve functional use of LUE, decrease pain    Currently in Pain? Yes    Pain Score 5     Pain Location Shoulder    Pain Orientation Left    Pain Descriptors / Indicators Aching;Pins and needles    Pain Type Chronic pain    Pain Radiating Towards shoulder to elbow    Pain Onset More than a month ago    Pain Frequency Intermittent   "some days are definitely worse than others; today is a good day"           OT Treatments/Exercises (OP) - 09/17/20 1153      Elbow Exercises   Other elbow exercises Isometric strengthening of elbow flexion and extension pushing against small ball completed while seated; 5 reps x 3 sets   Able  to complete exercises within tolerable level of pain; short rest breaks taken between sets. OT facilitated shoulder stabilization to LUE, which pt reported helped alleviate pain     Modalities   Modalities Moist Heat   participated in PVC tree construction activity while moist heat applied to L shoulder     Moist Heat Therapy   Number Minutes Moist Heat 15 Minutes    Moist Heat Location Shoulder      Splinting   Splinting OT donned LUE brace for shoulder support at start of session   Pt recommended OT create handout to provide to caregivers at home for brace application. OT took pictures of steps during application of brace application to create handout to give to pt next session     Fine Motor Coordination (Hand/Wrist)   Manipulation of small objects [place] figures with OT handing pt correct pieces for pt to connect together to improve  coordination. Pt able to use both hands together well and demo'd improved smoothness of L hand dexterity and grasp and release during activity   2 figures; completed with moist heat therapy applied to LUE           OT Education - 09/17/20 1135    Education Details Education provided on OT goals and current progress    Person(s) Educated Patient    Methods Explanation    Comprehension Verbalized understanding            OT Long Term Goals - 08/17/20 0850      OT LONG TERM GOAL #1   Title Pt will demonstrate/verbalize understanding of compensation and joint protection strategies to decrease pain and increase independence during ADLs.    Baseline Currently Mod A with dressing; participation in ADLs limited by pain    Time 6    Period Weeks    Status On-going      OT LONG TERM GOAL #2   Title Pt will improve functional FM coordination of as evidenced by ability to place >50% of pegs into pegboard with LUE.    Baseline Unable to place small pegs with LUE during eval.    Time 6    Period Weeks    Status On-going      OT LONG TERM GOAL #3   Title Pt will be independent with HEP for BUE strengthening and GM coordination to improve independence with functional tasks.    Baseline Pt reports no HEP for UE strength/coordination.    Time 6    Period Weeks    Status On-going      OT LONG TERM GOAL #4   Title Pt will improve functional grasp of L hand as evidenced by increasing grip strength by >9 lbs.    Baseline LUE = 11 lbs; RUE = 33 lbs    Time 6    Period Weeks    Status On-going            Plan - 09/17/20 1140    Clinical Impression Statement Shoulder support brace and moist heat applied to R shoulder at start of session for pain management during therapeutic exercises. OT implemented isometric strengthening exercises to decrease R leaning when ambulating w/ RW and facilitate functional use of LUE within tolerable level of pain. Pt demo'd increased smoothness of [place] this  session compared with previous session and did not require v/c to slow movements down. Dyspnea also seemed improved this session.    OT Occupational Profile and History Problem Focused Assessment -  Including review of records relating to presenting problem    Occupational performance deficits (Please refer to evaluation for details): ADL's;IADL's    Body Structure / Function / Physical Skills ADL;UE functional use;Balance;Body mechanics;Pain;FMC;Proprioception;ROM;Coordination;GMC;Sensation;Strength;Dexterity    Rehab Potential Fair    Clinical Decision Making Several treatment options, min-mod task modification necessary    Comorbidities Affecting Occupational Performance: May have comorbidities impacting occupational performance    Modification or Assistance to Complete Evaluation  Min-Moderate modification of tasks or assist with assess necessary to complete eval    OT Frequency 2x / week    OT Duration 6 weeks    OT Treatment/Interventions Self-care/ADL training;Ultrasound;Compression bandaging;DME and/or AE instruction;Patient/family education;Passive range of motion;Electrical Stimulation;Splinting;Moist Heat;Therapeutic exercise;Manual Therapy;Therapeutic activities;Neuromuscular education;Energy conservation;Iontophoresis;Cryotherapy;Functional Mobility Training;Aquatic Therapy    Plan continue with PoC    Consulted and Agree with Plan of Care Patient           Patient will benefit from skilled therapeutic intervention in order to improve the following deficits and impairments:   Body Structure / Function / Physical Skills: ADL,UE functional use,Balance,Body mechanics,Pain,FMC,Proprioception,ROM,Coordination,GMC,Sensation,Strength,Dexterity       Visit Diagnosis: Hemiplegia and hemiparesis following cerebral infarction affecting left non-dominant side (HCC)  Ataxia  Muscle weakness (generalized)  Left shoulder pain, unspecified chronicity  Other lack of  coordination  Abnormal posture    Problem List Patient Active Problem List   Diagnosis Date Noted  . Presbycusis of both ears 03/08/2020  . Mixed stress and urge urinary incontinence 12/02/2019  . Macrocytosis 12/01/2019  . Nutritional anemia 12/01/2019  . S/P total knee replacement 01/27/2019  . Recurrent left knee instability 07/05/2018  . Respiratory failure with hypoxia ([place]) 08/30/2017  . Hypoxemia   . Heart failure with preserved ejection fraction ([place]), Grade 3 diastolic dysfunction 52/84/1324  . PAF (paroxysmal atrial fibrillation) ([place])   . Dyspnea 03/19/2017  . Encounter for preventive health examination 02/17/2016  . Sensorineural hearing loss (SNHL), bilateral 01/26/2016  . Morbid obesity ([place]) 06/17/2015  . Hypomagnesemia 04/24/2014  . Hemiparesis affecting left side as late effect of cerebrovascular accident ([place]) 04/24/2014  . Nontraumatic cerebral hemorrhage ([place]) 04/30/2012  . DM (diabetes mellitus) with complications ([place]) 40/05/2724  . OSTEOPENIA 01/21/2009  . UNSPECIFIED VITAMIN D DEFICIENCY 11/19/2007  . ESSENTIAL HYPERTENSION, BENIGN 11/19/2007  . HYPERCHOLESTEROLEMIA 10/25/2006  . GASTROESOPHAGEAL REFLUX, NO ESOPHAGITIS 10/25/2006  . DIVERTICULOSIS OF COLON 10/25/2006  . Osteoarthritis 10/25/2006  . CERVICAL SPINE DISORDER, NOS 10/25/2006     Kathrine Cords, OTR/L, MSOT 09/17/2020, 12:08 PM  [place]. [place], [place], [address] Phone: [phone]   Fax:  [phone]  Name: Karen Jackson MRN: [id number] Date of Birth: [birth year]

## 2020-09-17 NOTE — Therapy (Signed)
[place]. [place], [place], [address] Phone: [phone]   Fax:  [phone]  Physical Therapy Treatment  Patient Details  Name: Karen Jackson MRN: [id number] Date of Birth: 1941-03-30 Referring Provider (PT): Hensel   Encounter Date: 09/17/2020   PT End of Session - 09/17/20 1152    Visit Number 25    Date for PT Re-Evaluation 10/18/20    PT Start Time 1059    PT Stop Time 1144    PT Time Calculation (min) 45 min    Activity Tolerance Patient limited by fatigue    Behavior During Therapy [facility] for tasks assessed/performed           Past Medical History:  Diagnosis Date  . Acute cystitis without hematuria   . Acute diastolic CHF (congestive heart failure) ([place])   . Arthritis   . Dyspnea   . Dysrhythmia   . Fever of unknown origin 03/19/2017  . Hyperlipidemia   . Hypertension    denies at preop  . Multifocal pneumonia   . Neuromuscular disorder ([place])    neuropathy left arm and foot  . Osteopenia   . Paralysis ([place])    partial left side from CVA   . Persistent atrial fibrillation ([place])   . PONV (postoperative nausea and vomiting)   . Pre-diabetes   . Stroke [facility]) 2013   hemmorahgic    Past Surgical History:  Procedure Laterality Date  . ANKLE SURGERY    . APPENDECTOMY    . CHOLECYSTECTOMY    . HERNIA REPAIR     Esophagus  . JOINT REPLACEMENT     total- right partial- left  . MASTECTOMY PARTIAL / LUMPECTOMY  2012   left  . ORIF ANKLE FRACTURE Left 07/20/2018   Procedure: OPEN REDUCTION INTERNAL FIXATION (ORIF) ANKLE FRACTURE;  Surgeon: Wylene Simmer, MD;  Location: [facility];  Service: Orthopedics;  Laterality: Left;  . TOTAL KNEE ARTHROPLASTY Left 01/27/2019   Procedure: TOTAL KNEE ARTHROPLASTY;  Surgeon: Vickey Huger, MD;  [facility];  Service: Orthopedics;  Laterality: Left;    There were no vitals filed for this visit.   Subjective Assessment - 09/17/20 1057    Subjective I have been  inside for over 8 days due to the snow and ice, reports that she has tried to walk to the den every day, my caregiver says that I tense up every time that I get to the stones.    Currently in Pain? No/denies                             [facility] Adult PT Treatment/Exercise - 09/17/20 0001      Ambulation/Gait   Gait Comments practiced with her on lifting the walker over a dowel rod also her stepping over the dowel rod, we walked 100 feet x 3, she is less short of breath today but still about 100 feet is her max      Knee/Hip Exercises: Aerobic   Nustep level 5 x 7.5 minutes 500 steps      Knee/Hip Exercises: Standing   Other Standing Knee Exercises posture tall standing in the parallel bars      Knee/Hip Exercises: Seated   Other Seated Knee/Hip Exercises green tband ankle PF/DF, left hip adduction, ball squeeze b/n knees, physio ball in lap isometric abs    Hamstring Curl Both;3 sets;10 reps    Hamstring Limitations green tband  PT Short Term Goals - 06/18/20 1105      PT SHORT TERM GOAL #1   Title independent with initial HEP    Status Partially Met             PT Long Term Goals - 09/17/20 1156      PT LONG TERM GOAL #1   Title walk with hand hold assist x 100 feet    Status On-going      PT LONG TERM GOAL #2   Title increased right LE strength to 4-/5    Status On-going      PT LONG TERM GOAL #3   Title walk 100 feet with SBA using the FWW    Status Partially Met      PT LONG TERM GOAL #4   Title transfer independently with set up    Status Partially Met                 Plan - 09/17/20 1152    Clinical Impression Statement Patient had pneumonia the past two weeks and has not been in to see [place] in 8 days due to snow and ice, she did suprisingly well with treatment today, still very short of breath and cannot tolerate walking > 100 feet, she has issues at her home about walking on the stone foyer, she tenses up  and has difficulty negotiating the stones, that is why I had her work on the lifting the walker over the dowel rod.  We have had set backs the past period as noted above, including holidays, I have seen today that there was less control of the left hip with it abducting on the Nustep, needed much more cues to correct this today    PT Frequency 2x / week    PT Duration 4 weeks    Consulted and Agree with Plan of Care Patient           Patient will benefit from skilled therapeutic intervention in order to improve the following deficits and impairments:  Abnormal gait,Cardiopulmonary status limiting activity,Decreased activity tolerance,Decreased balance,Decreased mobility,Decreased strength,Decreased coordination,Decreased range of motion,Difficulty walking,Increased muscle spasms,Pain  Visit Diagnosis: Hemiplegia and hemiparesis following cerebral infarction affecting left non-dominant side ([place]) - Plan: PT plan of care cert/re-cert  Ataxia - Plan: PT plan of care cert/re-cert  Muscle weakness (generalized) - Plan: PT plan of care cert/re-cert  Left shoulder pain, unspecified chronicity - Plan: PT plan of care cert/re-cert  Other lack of coordination - Plan: PT plan of care cert/re-cert  Abnormal posture - Plan: PT plan of care cert/re-cert  Difficulty in walking, not elsewhere classified - Plan: PT plan of care cert/re-cert  Late effects of CVA (cerebrovascular accident) - Plan: PT plan of care cert/re-cert  Ataxia, unspecified - Plan: PT plan of care cert/re-cert     Problem List Patient Active Problem List   Diagnosis Date Noted  . Presbycusis of both ears 03/08/2020  . Mixed stress and urge urinary incontinence 12/02/2019  . Macrocytosis 12/01/2019  . Nutritional anemia 12/01/2019  . S/P total knee replacement 01/27/2019  . Recurrent left knee instability 07/05/2018  . Respiratory failure with hypoxia ([place]) 08/30/2017  . Hypoxemia   . Heart failure with preserved  ejection fraction ([place]), Grade 3 diastolic dysfunction 40/34/7425  . PAF (paroxysmal atrial fibrillation) ([place])   . Dyspnea 03/19/2017  . Encounter for preventive health examination 02/17/2016  . Sensorineural hearing loss (SNHL), bilateral 01/26/2016  . Morbid obesity ([place]) 06/17/2015  . Hypomagnesemia 04/24/2014  .  Hemiparesis affecting left side as late effect of cerebrovascular accident ([place]) 04/24/2014  . Nontraumatic cerebral hemorrhage ([place]) 04/30/2012  . DM (diabetes mellitus) with complications ([place]) 77/41/2878  . OSTEOPENIA 01/21/2009  . UNSPECIFIED VITAMIN D DEFICIENCY 11/19/2007  . ESSENTIAL HYPERTENSION, BENIGN 11/19/2007  . HYPERCHOLESTEROLEMIA 10/25/2006  . GASTROESOPHAGEAL REFLUX, NO ESOPHAGITIS 10/25/2006  . DIVERTICULOSIS OF COLON 10/25/2006  . Osteoarthritis 10/25/2006  . CERVICAL SPINE DISORDER, NOS 10/25/2006    Sumner Boast., PT 09/17/2020, 11:58 AM  [place]. [place], [place], [address] Phone: [phone]   Fax:  [phone]  Name: ALAHIA WHICKER MRN: [id number] Date of Birth: [birth year]

## 2020-09-20 ENCOUNTER — Encounter: Payer: Self-pay | Admitting: Occupational Therapy

## 2020-09-20 ENCOUNTER — Encounter: Payer: Self-pay | Admitting: Physical Therapy

## 2020-09-20 ENCOUNTER — Ambulatory Visit: Payer: Medicare Other | Admitting: Occupational Therapy

## 2020-09-20 ENCOUNTER — Other Ambulatory Visit: Payer: Self-pay

## 2020-09-20 ENCOUNTER — Ambulatory Visit: Payer: Medicare Other | Admitting: Physical Therapy

## 2020-09-20 DIAGNOSIS — R278 Other lack of coordination: Secondary | ICD-10-CM

## 2020-09-20 DIAGNOSIS — I69354 Hemiplegia and hemiparesis following cerebral infarction affecting left non-dominant side: Secondary | ICD-10-CM

## 2020-09-20 DIAGNOSIS — M6281 Muscle weakness (generalized): Secondary | ICD-10-CM

## 2020-09-20 DIAGNOSIS — R293 Abnormal posture: Secondary | ICD-10-CM

## 2020-09-20 DIAGNOSIS — I6990 Unspecified sequelae of unspecified cerebrovascular disease: Secondary | ICD-10-CM

## 2020-09-20 DIAGNOSIS — M25512 Pain in left shoulder: Secondary | ICD-10-CM | POA: Diagnosis not present

## 2020-09-20 DIAGNOSIS — R270 Ataxia, unspecified: Secondary | ICD-10-CM

## 2020-09-20 DIAGNOSIS — R262 Difficulty in walking, not elsewhere classified: Secondary | ICD-10-CM

## 2020-09-20 NOTE — Therapy (Signed)
[place]. [place], [place], [address] Phone: [phone]   Fax:  [phone]  Occupational Therapy Treatment  Patient Details  Name: Karen Jackson MRN: [id number] Date of Birth: 20-Feb-1941 Referring Provider (OT): Madison Hickman   Encounter Date: 09/20/2020   OT End of Session - 09/20/20 1406    Visit Number 7    Number of Visits 13    Date for OT Re-Evaluation 10/01/20    Authorization Type Medicare    Progress Note Due on Visit 10    OT Start Time 1015    OT Stop Time 1059    OT Time Calculation (min) 44 min    Activity Tolerance Patient tolerated treatment well    Behavior During Therapy [facility] for tasks assessed/performed           Past Medical History:  Diagnosis Date  . Acute cystitis without hematuria   . Acute diastolic CHF (congestive heart failure) ([place])   . Arthritis   . Dyspnea   . Dysrhythmia   . Fever of unknown origin 03/19/2017  . Hyperlipidemia   . Hypertension    denies at preop  . Multifocal pneumonia   . Neuromuscular disorder ([place])    neuropathy left arm and foot  . Osteopenia   . Paralysis ([place])    partial left side from CVA   . Persistent atrial fibrillation ([place])   . PONV (postoperative nausea and vomiting)   . Pre-diabetes   . Stroke [facility]) 2013   hemmorahgic    Past Surgical History:  Procedure Laterality Date  . ANKLE SURGERY    . APPENDECTOMY    . CHOLECYSTECTOMY    . HERNIA REPAIR     Esophagus  . JOINT REPLACEMENT     total- right partial- left  . MASTECTOMY PARTIAL / LUMPECTOMY  2012   left  . ORIF ANKLE FRACTURE Left 07/20/2018   Procedure: OPEN REDUCTION INTERNAL FIXATION (ORIF) ANKLE FRACTURE;  Surgeon: Wylene Simmer, MD;  Location: [facility];  Service: Orthopedics;  Laterality: Left;  . TOTAL KNEE ARTHROPLASTY Left 01/27/2019   Procedure: TOTAL KNEE ARTHROPLASTY;  Surgeon: Vickey Huger, MD;  [facility];  Service: Orthopedics;  Laterality: Left;     There were no vitals filed for this visit.   Subjective Assessment - 09/20/20 1051    Subjective  "My shoulder feels better than it did last week"    Pertinent History CVA with L hemiparesis (2013), OA, HTN, CHF, pre-diabetes    Limitations Pain in LUE, impaired sensation, decreased strength and coordination, decreased PROM/AROM    Patient Stated Goals Improve functional use of LUE, decrease pain    Currently in Pain? Yes    Pain Score 3     Pain Location Shoulder    Pain Orientation Left    Pain Descriptors / Indicators Throbbing;Dull    Pain Type Chronic pain    Pain Radiating Towards shoulder to elbow    Pain Onset More than a month ago    Pain Frequency Intermittent            OT Treatments/Exercises (OP) - 09/20/20 1346      Neurological Re-education Exercises   Shoulder Flexion Strengthening;Left;10 reps;Seated   isometric shoulder flexion/extension completed while seated in w/c, pushing against pillow; pt required OT support under forearm and at shoulder during isometric shoulder extension exercises to alleviate pain   Other Grasp and Release Exercises  Grasp and release of small cones into a  bucket to facilitate improved release with L hand; completed 3 sets   required min v/c to slow down and complete releasing movement with entire hand; release improved with repetition     Modalities   Modalities Moist Heat   participated in Connect 4 activity while moist heat applied to L shoulder     Moist Heat Therapy   Number Minutes Moist Heat 15 Minutes    Moist Heat Location Shoulder      Splinting   Splinting OT donned LUE brace for shoulder support at start of session   OT reviewed handout created to demonstrate donning/doffing of shoulder brace with pt; handout was administered     Fine Motor Coordination (Hand/Wrist)   Manipulation of small objects Used L hand to retrieve Connect 4 pieces out of game box and release them into a bowl held at elbow height. Pt then used  pieces to play game; requiring v/c cues to slow movements down when releasing pieces into game stand.            OT Long Term Goals - 08/17/20 0850      OT LONG TERM GOAL #1   Title Pt will demonstrate/verbalize understanding of compensation and joint protection strategies to decrease pain and increase independence during ADLs.    Baseline Currently Mod A with dressing; participation in ADLs limited by pain    Time 6    Period Weeks    Status On-going      OT LONG TERM GOAL #2   Title Pt will improve functional FM coordination of as evidenced by ability to place >50% of pegs into pegboard with LUE.    Baseline Unable to place small pegs with LUE during eval.    Time 6    Period Weeks    Status On-going      OT LONG TERM GOAL #3   Title Pt will be independent with HEP for BUE strengthening and GM coordination to improve independence with functional tasks.    Baseline Pt reports no HEP for UE strength/coordination.    Time 6    Period Weeks    Status On-going      OT LONG TERM GOAL #4   Title Pt will improve functional grasp of L hand as evidenced by increasing grip strength by >9 lbs.    Baseline LUE = 11 lbs; RUE = 33 lbs    Time 6    Period Weeks    Status On-going            Plan - 09/20/20 1518    Clinical Impression Statement Shoulder support brace and moist heat applied to R shoulder at start of session for pain management during therapy. Decreased smoothness when releasing objects notable this session, with consistent lag of L index finger. Continued grasp and release activities will be beneficial. OT continued isometric shoulder strengthening exercises from previous session and pt was able to complete exercises without pain.    OT Occupational Profile and History Problem Focused Assessment - Including review of records relating to presenting problem    Occupational performance deficits (Please refer to evaluation for details): ADL's;IADL's    Body Structure /  Function / Physical Skills ADL;UE functional use;Balance;Body mechanics;Pain;FMC;Proprioception;ROM;Coordination;GMC;Sensation;Strength;Dexterity    Rehab Potential Fair    Clinical Decision Making Several treatment options, min-mod task modification necessary    Comorbidities Affecting Occupational Performance: May have comorbidities impacting occupational performance    Modification or Assistance to Complete Evaluation  Min-Moderate modification of tasks or  assist with assess necessary to complete eval    OT Frequency 2x / week    OT Duration 6 weeks    OT Treatment/Interventions Self-care/ADL training;Ultrasound;Compression bandaging;DME and/or AE instruction;Patient/family education;Passive range of motion;Electrical Stimulation;Splinting;Moist Heat;Therapeutic exercise;Manual Therapy;Therapeutic activities;Neuromuscular education;Energy conservation;Iontophoresis;Cryotherapy;Functional Mobility Training;Aquatic Therapy    Plan continue with PoC; grasp and release activities    Consulted and Agree with Plan of Care Patient           Patient will benefit from skilled therapeutic intervention in order to improve the following deficits and impairments:   Body Structure / Function / Physical Skills: ADL,UE functional use,Balance,Body mechanics,Pain,FMC,Proprioception,ROM,Coordination,GMC,Sensation,Strength,Dexterity        Visit Diagnosis: Hemiplegia and hemiparesis following cerebral infarction affecting left non-dominant side (HCC)  Ataxia  Muscle weakness (generalized)  Left shoulder pain, unspecified chronicity  Other lack of coordination  Abnormal posture    Problem List Patient Active Problem List   Diagnosis Date Noted  . Presbycusis of both ears 03/08/2020  . Mixed stress and urge urinary incontinence 12/02/2019  . Macrocytosis 12/01/2019  . Nutritional anemia 12/01/2019  . S/P total knee replacement 01/27/2019  . Recurrent left knee instability 07/05/2018  .  Respiratory failure with hypoxia ([place]) 08/30/2017  . Hypoxemia   . Heart failure with preserved ejection fraction ([place]), Grade 3 diastolic dysfunction [id number]  . PAF (paroxysmal atrial fibrillation) ([place])   . Dyspnea 03/19/2017  . Encounter for preventive health examination 02/17/2016  . Sensorineural hearing loss (SNHL), bilateral 01/26/2016  . Morbid obesity ([place]) 06/17/2015  . Hypomagnesemia 04/24/2014  . Hemiparesis affecting left side as late effect of cerebrovascular accident ([place]) 04/24/2014  . Nontraumatic cerebral hemorrhage ([place]) 04/30/2012  . DM (diabetes mellitus) with complications ([place]) 0000000  . OSTEOPENIA 01/21/2009  . UNSPECIFIED VITAMIN D DEFICIENCY 11/19/2007  . ESSENTIAL HYPERTENSION, BENIGN 11/19/2007  . HYPERCHOLESTEROLEMIA 10/25/2006  . GASTROESOPHAGEAL REFLUX, NO ESOPHAGITIS 10/25/2006  . DIVERTICULOSIS OF COLON 10/25/2006  . Osteoarthritis 10/25/2006  . CERVICAL SPINE DISORDER, NOS 10/25/2006     Kathrine Cords, OTR/L, MSOT 09/20/2020, 3:26 PM  [place]. [place], [place], [address] Phone: [phone]   Fax:  [phone]  Name: ANAIZA AZBILL MRN: [id number] Date of Birth: 01/16/1941

## 2020-09-20 NOTE — Therapy (Signed)
[facility]. [place], [place], [address] Phone: [phone]   Fax:  [phone]  Physical Therapy Treatment  Patient Details  Name: Karen Jackson MRN: [id number] Date of Birth: 05-06-1941 Referring Provider (PT): Hensel   Encounter Date: 09/20/2020   PT End of Session - 09/20/20 1143    Visit Number 26    Date for PT Re-Evaluation 10/18/20    PT Start Time 1059    PT Stop Time 1142    PT Time Calculation (min) 43 min    Activity Tolerance Patient limited by fatigue;Patient limited by pain    Behavior During Therapy [facility] for tasks assessed/performed           Past Medical History:  Diagnosis Date  . Acute cystitis without hematuria   . Acute diastolic CHF (congestive heart failure) ([place])   . Arthritis   . Dyspnea   . Dysrhythmia   . Fever of unknown origin 03/19/2017  . Hyperlipidemia   . Hypertension    denies at preop  . Multifocal pneumonia   . Neuromuscular disorder ([place])    neuropathy left arm and foot  . Osteopenia   . Paralysis ([place])    partial left side from CVA   . Persistent atrial fibrillation ([place])   . PONV (postoperative nausea and vomiting)   . Pre-diabetes   . Stroke [facility]) 2013   hemmorahgic    Past Surgical History:  Procedure Laterality Date  . ANKLE SURGERY    . APPENDECTOMY    . CHOLECYSTECTOMY    . HERNIA REPAIR     Esophagus  . JOINT REPLACEMENT     total- right partial- left  . MASTECTOMY PARTIAL / LUMPECTOMY  2012   left  . ORIF ANKLE FRACTURE Left 07/20/2018   Procedure: OPEN REDUCTION INTERNAL FIXATION (ORIF) ANKLE FRACTURE;  Surgeon: Wylene Simmer, MD;  Location: [facility];  Service: Orthopedics;  Laterality: Left;  . TOTAL KNEE ARTHROPLASTY Left 01/27/2019   Procedure: TOTAL KNEE ARTHROPLASTY;  Surgeon: Vickey Huger, MD;  [facility];  Service: Orthopedics;  Laterality: Left;    There were no vitals filed for this visit.   Subjective Assessment - 09/20/20 1107     Subjective Patient reports that she is having left big toe pain, she reports that the toe nail is hurting.  She will see a podiatrist tomorrow    Currently in Pain? Yes    Pain Score 6     Pain Location Toe (Comment which one)    Pain Orientation Left    Pain Descriptors / Indicators Aching;Throbbing;Sore    Aggravating Factors  walking                             [facility] Adult PT Treatment/Exercise - 09/20/20 0001      Ambulation/Gait   Gait Comments gait with FWW, CGA 75' x 2 and then 115 ' x 1.      High Level Balance   High Level Balance Comments in parallel bars side stepping, then work on posture, 4" toe touches      Knee/Hip Exercises: Aerobic   Nustep level 6 x 500 steps 8.5 minutes      Knee/Hip Exercises: Seated   Long Arc Quad Both;3 sets;10 reps    Long Arc Quad Weight 5 lbs.    Other Seated Knee/Hip Exercises red tband ankle PF/DF, left hip adduction, ball squeeze b/n knees, physio  ball in lap isometric abs red tband on the ankle today due to the toe pain    Hamstring Curl Both;3 sets;10 reps    Hamstring Limitations green tband                    PT Short Term Goals - 06/18/20 1105      PT SHORT TERM GOAL #1   Title independent with initial HEP    Status Partially Met             PT Long Term Goals - 09/17/20 1156      PT LONG TERM GOAL #1   Title walk with hand hold assist x 100 feet    Status On-going      PT LONG TERM GOAL #2   Title increased right LE strength to 4-/5    Status On-going      PT LONG TERM GOAL #3   Title walk 100 feet with SBA using the FWW    Status Partially Met      PT LONG TERM GOAL #4   Title transfer independently with set up    Status Partially Met                 Plan - 09/20/20 1143    Clinical Impression Statement Patient having left great toe pain today, I tried to protect this area, she reports that she will see the podiatrist tomorrow.  She continues to [employer] and struggle  with breathing if we go to 100 feet of ambulation.    PT Next Visit Plan work on returning her to functional gait and recovering from pneumonia    Consulted and Agree with Plan of Care Patient           Patient will benefit from skilled therapeutic intervention in order to improve the following deficits and impairments:  Abnormal gait,Cardiopulmonary status limiting activity,Decreased activity tolerance,Decreased balance,Decreased mobility,Decreased strength,Decreased coordination,Decreased range of motion,Difficulty walking,Increased muscle spasms,Pain  Visit Diagnosis: Hemiplegia and hemiparesis following cerebral infarction affecting left non-dominant side (HCC)  Ataxia  Muscle weakness (generalized)  Abnormal posture  Difficulty in walking, not elsewhere classified  Late effects of CVA (cerebrovascular accident)  Ataxia, unspecified     Problem List Patient Active Problem List   Diagnosis Date Noted  . Presbycusis of both ears 03/08/2020  . Mixed stress and urge urinary incontinence 12/02/2019  . Macrocytosis 12/01/2019  . Nutritional anemia 12/01/2019  . S/P total knee replacement 01/27/2019  . Recurrent left knee instability 07/05/2018  . Respiratory failure with hypoxia ([place]) 08/30/2017  . Hypoxemia   . Heart failure with preserved ejection fraction ([place]), Grade 3 diastolic dysfunction 32/35/5732  . PAF (paroxysmal atrial fibrillation) ([place])   . Dyspnea 03/19/2017  . Encounter for preventive health examination 02/17/2016  . Sensorineural hearing loss (SNHL), bilateral 01/26/2016  . Morbid obesity ([place]) 06/17/2015  . Hypomagnesemia 04/24/2014  . Hemiparesis affecting left side as late effect of cerebrovascular accident ([place]) 04/24/2014  . Nontraumatic cerebral hemorrhage ([place]) 04/30/2012  . DM (diabetes mellitus) with complications ([place]) 20/25/4270  . OSTEOPENIA 01/21/2009  . UNSPECIFIED VITAMIN D DEFICIENCY 11/19/2007  . ESSENTIAL HYPERTENSION, BENIGN  11/19/2007  . HYPERCHOLESTEROLEMIA 10/25/2006  . GASTROESOPHAGEAL REFLUX, NO ESOPHAGITIS 10/25/2006  . DIVERTICULOSIS OF COLON 10/25/2006  . Osteoarthritis 10/25/2006  . CERVICAL SPINE DISORDER, NOS 10/25/2006    Sumner Boast., PT 09/20/2020, 11:45 AM  [place]. [place], [place], [address] Phone: [phone]  Fax:  [phone]  Name: Karen Jackson MRN: [id number] Date of Birth: 02-Dec-1940

## 2020-09-21 ENCOUNTER — Ambulatory Visit (INDEPENDENT_AMBULATORY_CARE_PROVIDER_SITE_OTHER): Payer: Medicare Other | Admitting: Podiatry

## 2020-09-21 DIAGNOSIS — L603 Nail dystrophy: Secondary | ICD-10-CM | POA: Diagnosis not present

## 2020-09-21 DIAGNOSIS — B351 Tinea unguium: Secondary | ICD-10-CM

## 2020-09-21 DIAGNOSIS — M79609 Pain in unspecified limb: Secondary | ICD-10-CM | POA: Diagnosis not present

## 2020-09-21 NOTE — Progress Notes (Signed)
    Subjective:  Patient ID: Karen Jackson, female    DOB: 11-27-1940,  MRN: [id number]  Chief Complaint  Patient presents with  . Nail Problem    LT hallux nail sore, loose, redness at tip and swelling x sat; 2/10 - had swelling tx: epsom salt and vicks -worse with pressure     80 y.o. female presents with the above complaint. History confirmed with patient.   Objective:  Physical Exam: warm, good capillary refill, nail exam onychomycosis of the toenails - left hallux partial lysis, no trophic changes or ulcerative lesions. DP pulses palpable, PT pulses palpable and protective sensation intact   No images are attached to the encounter.  Assessment:   1. Nail dystrophy   2. Pain due to onychomycosis of nail    Plan:  Patient was evaluated and treated and all questions answered.  Onychomycosis  With partial left hallux lysis -Continue [place] -Nails courtesy debrided today  -F/u in 3 months  No follow-ups on file.

## 2020-09-23 ENCOUNTER — Encounter: Payer: Self-pay | Admitting: Occupational Therapy

## 2020-09-23 ENCOUNTER — Ambulatory Visit: Payer: Medicare Other | Admitting: Physical Therapy

## 2020-09-23 ENCOUNTER — Encounter: Payer: Self-pay | Admitting: Physical Therapy

## 2020-09-23 ENCOUNTER — Other Ambulatory Visit: Payer: Self-pay

## 2020-09-23 ENCOUNTER — Ambulatory Visit: Payer: Medicare Other | Admitting: Occupational Therapy

## 2020-09-23 DIAGNOSIS — R278 Other lack of coordination: Secondary | ICD-10-CM | POA: Diagnosis not present

## 2020-09-23 DIAGNOSIS — R262 Difficulty in walking, not elsewhere classified: Secondary | ICD-10-CM

## 2020-09-23 DIAGNOSIS — R270 Ataxia, unspecified: Secondary | ICD-10-CM

## 2020-09-23 DIAGNOSIS — R293 Abnormal posture: Secondary | ICD-10-CM

## 2020-09-23 DIAGNOSIS — I69354 Hemiplegia and hemiparesis following cerebral infarction affecting left non-dominant side: Secondary | ICD-10-CM

## 2020-09-23 DIAGNOSIS — I6990 Unspecified sequelae of unspecified cerebrovascular disease: Secondary | ICD-10-CM

## 2020-09-23 DIAGNOSIS — M25512 Pain in left shoulder: Secondary | ICD-10-CM

## 2020-09-23 DIAGNOSIS — M6281 Muscle weakness (generalized): Secondary | ICD-10-CM

## 2020-09-23 NOTE — Therapy (Signed)
[facility]. [place], [place], [address] Phone: [phone]   Fax:  [phone]  Occupational Therapy Treatment  Patient Details  Name: Karen Jackson MRN: [id number] Date of Birth: June 15, 1941 Referring Provider (OT): Madison Hickman   Encounter Date: 09/23/2020   OT End of Session - 09/23/20 1720    Visit Number 8    Number of Visits 13    Date for OT Re-Evaluation 10/01/20    Authorization Type Medicare    Progress Note Due on Visit 10    OT Start Time 1017    OT Stop Time 1100    OT Time Calculation (min) 43 min    Activity Tolerance Patient tolerated treatment well    Behavior During Therapy [facility] for tasks assessed/performed           Past Medical History:  Diagnosis Date  . Acute cystitis without hematuria   . Acute diastolic CHF (congestive heart failure) ([place])   . Arthritis   . Dyspnea   . Dysrhythmia   . Fever of unknown origin 03/19/2017  . Hyperlipidemia   . Hypertension    denies at preop  . Multifocal pneumonia   . Neuromuscular disorder ([place])    neuropathy left arm and foot  . Osteopenia   . Paralysis ([place])    partial left side from CVA   . Persistent atrial fibrillation ([place])   . PONV (postoperative nausea and vomiting)   . Pre-diabetes   . Stroke [facility]) 2013   hemmorahgic    Past Surgical History:  Procedure Laterality Date  . ANKLE SURGERY    . APPENDECTOMY    . CHOLECYSTECTOMY    . HERNIA REPAIR     Esophagus  . JOINT REPLACEMENT     total- right partial- left  . MASTECTOMY PARTIAL / LUMPECTOMY  2012   left  . ORIF ANKLE FRACTURE Left 07/20/2018   Procedure: OPEN REDUCTION INTERNAL FIXATION (ORIF) ANKLE FRACTURE;  Surgeon: Wylene Simmer, MD;  Location: [facility];  Service: Orthopedics;  Laterality: Left;  . TOTAL KNEE ARTHROPLASTY Left 01/27/2019   Procedure: TOTAL KNEE ARTHROPLASTY;  Surgeon: Vickey Huger, MD;  Location[facility];  Service: Orthopedics;  Laterality: Left;     There were no vitals filed for this visit.   Subjective Assessment - 09/23/20 1717    Subjective  "We've been slipping the brace on and off without undoing any of the straps and it's been working pretty well."    Pertinent History CVA with L hemiparesis (2013), OA, HTN, CHF, pre-diabetes    Limitations Pain in LUE, impaired sensation, decreased strength and coordination, decreased PROM/AROM    Patient Stated Goals Improve functional use of LUE, decrease pain    Currently in Pain? No/denies            OT Treatments/Exercises (OP) - 09/23/20 1726      Transfers   Comments Max A SPT from w/c to mat w/out device; Mod A SPT from mat to w/c w/ RW      Hand Exercises   Other Hand Exercises Bubble tongs used to pick up marbles off tabletop and place them on a plate. Pt demo'd difficulty using fingertips and thumb for pinch and release; OT graded activity down to use full grasp to open tongs, pick up marble, and then grasp tongs to drop marble   OT held tongs upright to assist with placement when grasping/releasing marbles     Neurological Re-education Exercises  Shoulder Flexion Strengthening;Both;10 reps;Seated   isometric shoulder abduction completed while seated in EoM, pushing against pillow; 2 sets of 5 reps on both sides. P4t required OT support at L shoulder to alleviate pain   Seated with weight on hand Lateral weight shifting completed sitting on therapy mat; completed 5x2 each side with OT providing support and additional input at shoulder, elbow, and wrist/hand   pt demo'd primarily lateral flexion of trunk when weight shifting to L side; significant input from therapist required to facilitate weight bearing through LUE   Trunk Exercises Lateral Trunck Flexion;Core Activation    Trunk Core Activation Lateral trunk flexion and foward flexion completed 5x in each plane while sitting unsupported EoM (2 sets each)   Verbal and tactile cues required to center trunk at midline between  reps; dysnpea observed and OT facilitated rest breaks between sets     Modalities   Modalities Moist Heat   Isometric shoulder exercises completed during moist heat therapy     Moist Heat Therapy   Number Minutes Moist Heat 10 Minutes    Moist Heat Location Shoulder            OT Education - 09/23/20 1719    Education Details Education provided puspose/goals of weight-bearing strategies    Person(s) Educated Patient    Methods Explanation    Comprehension Verbalized understanding            OT Long Term Goals - 08/17/20 0850      OT LONG TERM GOAL #1   Title Pt will demonstrate/verbalize understanding of compensation and joint protection strategies to decrease pain and increase independence during ADLs.    Baseline Currently Mod A with dressing; participation in ADLs limited by pain    Time 6    Period Weeks    Status On-going      OT LONG TERM GOAL #2   Title Pt will improve functional FM coordination of as evidenced by ability to place >50% of pegs into pegboard with LUE.    Baseline Unable to place small pegs with LUE during eval.    Time 6    Period Weeks    Status On-going      OT LONG TERM GOAL #3   Title Pt will be independent with HEP for BUE strengthening and GM coordination to improve independence with functional tasks.    Baseline Pt reports no HEP for UE strength/coordination.    Time 6    Period Weeks    Status On-going      OT LONG TERM GOAL #4   Title Pt will improve functional grasp of L hand as evidenced by increasing grip strength by >9 lbs.    Baseline LUE = 11 lbs; RUE = 33 lbs    Time 6    Period Weeks    Status On-going            Plan - 09/23/20 1720    Clinical Impression Statement Moist heat applied to R shoulder at start of session for pain management during therapeutic activities. Status of OA in L shoulder continues to be limiting, but isometric strengthening appears beneficial and continue to be completed within tolerable level of  pain.    OT Occupational Profile and History Problem Focused Assessment - Including review of records relating to presenting problem    Occupational performance deficits (Please refer to evaluation for details): ADL's;IADL's    Body Structure / Function / Physical Skills ADL;UE functional use;Balance;Body mechanics;Pain;FMC;Proprioception;ROM;Coordination;GMC;Sensation;Strength;Dexterity  Rehab Potential Fair    Clinical Decision Making Several treatment options, min-mod task modification necessary    Comorbidities Affecting Occupational Performance: May have comorbidities impacting occupational performance    Modification or Assistance to Complete Evaluation  Min-Moderate modification of tasks or assist with assess necessary to complete eval    OT Frequency 2x / week    OT Duration 6 weeks    OT Treatment/Interventions Self-care/ADL training;Ultrasound;Compression bandaging;DME and/or AE instruction;Patient/family education;Passive range of motion;Electrical Stimulation;Splinting;Moist Heat;Therapeutic exercise;Manual Therapy;Therapeutic activities;Neuromuscular education;Energy conservation;Iontophoresis;Cryotherapy;Functional Mobility Training;Aquatic Therapy    Plan continue with PoC; grasp and release activities    Consulted and Agree with Plan of Care Patient           Patient will benefit from skilled therapeutic intervention in order to improve the following deficits and impairments:   Body Structure / Function / Physical Skills: ADL,UE functional use,Balance,Body mechanics,Pain,FMC,Proprioception,ROM,Coordination,GMC,Sensation,Strength,Dexterity       Visit Diagnosis: Hemiplegia and hemiparesis following cerebral infarction affecting left non-dominant side (HCC)  Muscle weakness (generalized)  Ataxia  Other lack of coordination  Left shoulder pain, unspecified chronicity  Abnormal posture    Problem List Patient Active Problem List   Diagnosis Date Noted  .  Presbycusis of both ears 03/08/2020  . Mixed stress and urge urinary incontinence 12/02/2019  . Macrocytosis 12/01/2019  . Nutritional anemia 12/01/2019  . S/P total knee replacement 01/27/2019  . Recurrent left knee instability 07/05/2018  . Respiratory failure with hypoxia ([place]) 08/30/2017  . Hypoxemia   . Heart failure with preserved ejection fraction ([place]), Grade 3 diastolic dysfunction [id number]  . PAF (paroxysmal atrial fibrillation) ([place])   . Dyspnea 03/19/2017  . Encounter for preventive health examination 02/17/2016  . Sensorineural hearing loss (SNHL), bilateral 01/26/2016  . Morbid obesity ([place]) 06/17/2015  . Hypomagnesemia 04/24/2014  . Hemiparesis affecting left side as late effect of cerebrovascular accident ([place]) 04/24/2014  . Nontraumatic cerebral hemorrhage ([place]) 04/30/2012  . DM (diabetes mellitus) with complications ([place]) 0000000  . OSTEOPENIA 01/21/2009  . UNSPECIFIED VITAMIN D DEFICIENCY 11/19/2007  . ESSENTIAL HYPERTENSION, BENIGN 11/19/2007  . HYPERCHOLESTEROLEMIA 10/25/2006  . GASTROESOPHAGEAL REFLUX, NO ESOPHAGITIS 10/25/2006  . DIVERTICULOSIS OF COLON 10/25/2006  . Osteoarthritis 10/25/2006  . CERVICAL SPINE DISORDER, NOS 10/25/2006     Kathrine Cords, OTR/L, MSOT 09/23/2020, 5:44 PM  [facility]. [place], [place], [address] Phone: [phone]   Fax:  [phone]  Name: Karen Jackson MRN: [id number] Date of Birth: [birth year]

## 2020-09-23 NOTE — Therapy (Signed)
[address] [facility] [place], [place], [address] Phone: [phone]   Fax:  [phone]  Physical Therapy Treatment  Patient Details  Name: Karen Jackson MRN: [id number] Date of Birth: 03-02-1941 Referring Provider (PT): [place]   Encounter Date: 09/23/2020   PT End of Session - 09/23/20 1147    Visit Number 27    Date for PT Re-Evaluation 10/18/20    PT Start Time 1101    PT Stop Time 1142    PT Time Calculation (min) 41 min    Activity Tolerance Patient tolerated treatment well    Behavior During Therapy [facility] for tasks assessed/performed           Past Medical History:  Diagnosis Date  . Acute cystitis without hematuria   . Acute diastolic CHF (congestive heart failure) ([place])   . Arthritis   . Dyspnea   . Dysrhythmia   . Fever of unknown origin 03/19/2017  . Hyperlipidemia   . Hypertension    denies at preop  . Multifocal pneumonia   . Neuromuscular disorder ([place])    neuropathy left arm and foot  . Osteopenia   . Paralysis ([place])    partial left side from CVA   . Persistent atrial fibrillation ([place])   . PONV (postoperative nausea and vomiting)   . Pre-diabetes   . Stroke [facility]) 2013   hemmorahgic    Past Surgical History:  Procedure Laterality Date  . ANKLE SURGERY    . APPENDECTOMY    . CHOLECYSTECTOMY    . HERNIA REPAIR     Esophagus  . JOINT REPLACEMENT     total- right partial- left  . MASTECTOMY PARTIAL / LUMPECTOMY  2012   left  . ORIF ANKLE FRACTURE Left 07/20/2018   Procedure: OPEN REDUCTION INTERNAL FIXATION (ORIF) ANKLE FRACTURE;  Surgeon: Wylene Simmer, MD;  Location: [facility];  Service: Orthopedics;  Laterality: Left;  . TOTAL KNEE ARTHROPLASTY Left 01/27/2019   Procedure: TOTAL KNEE ARTHROPLASTY;  Surgeon: Vickey Huger, MD;  [facility];  Service: Orthopedics;  Laterality: Left;    There were no vitals filed for this visit.   Subjective Assessment - 09/23/20 1116    Subjective Toe  nail was saved, foot and ote is not hurting today    Currently in Pain? No/denies              [facility] PT Assessment - 09/23/20 0001      Timed Up and Go Test   Normal TUG (seconds) 35    TUG Comments FWW                         [facility] Adult PT Treatment/Exercise - 09/23/20 0001      Ambulation/Gait   Gait Comments FWW, 100' x 3 CGA      Knee/Hip Exercises: Aerobic   Nustep level 6 x 500 steps 8.5 minutes      Knee/Hip Exercises: Standing   Other Standing Knee Exercises lifting walker up and over a dowel front wheels and back legs back and forth    Other Standing Knee Exercises 4" toe touches      Knee/Hip Exercises: Seated   Other Seated Knee/Hip Exercises left hip adduction green tband    Hamstring Curl Both;3 sets;10 reps    Hamstring Limitations green tband                    PT Short Term  Goals - 06/18/20 1105      PT SHORT TERM GOAL #1   Title independent with initial HEP    Status Partially Met             PT Long Term Goals - 09/23/20 1150      PT LONG TERM GOAL #1   Title walk with hand hold assist x 100 feet    Status On-going      PT LONG TERM GOAL #2   Title increased right LE strength to 4-/5    Status Partially Met      PT LONG TERM GOAL #3   Title walk 100 feet with SBA using the FWW    Status Partially Met                 Plan - 09/23/20 1147    Clinical Impression Statement Patient had a little c/o pain in the left shoulder and elbow today, she was in OT prior to our treatment, she is slowly improving, having issues with other health complications with pneumonia and toe issue that has made things a little more slowly.  She has one caregiver that really makes her do some walking at home, others not so much.  [name] reports some of them she does not trust to help her walk, so she does not ask them.,    PT Next Visit Plan work on returning her to functional gait and recovering from pneumonia    Consulted and  Agree with Plan of Care Patient           Patient will benefit from skilled therapeutic intervention in order to improve the following deficits and impairments:  Abnormal gait,Cardiopulmonary status limiting activity,Decreased activity tolerance,Decreased balance,Decreased mobility,Decreased strength,Decreased coordination,Decreased range of motion,Difficulty walking,Increased muscle spasms,Pain  Visit Diagnosis: Hemiplegia and hemiparesis following cerebral infarction affecting left non-dominant side (HCC)  Ataxia  Muscle weakness (generalized)  Difficulty in walking, not elsewhere classified  Late effects of CVA (cerebrovascular accident)  Ataxia, unspecified     Problem List Patient Active Problem List   Diagnosis Date Noted  . Presbycusis of both ears 03/08/2020  . Mixed stress and urge urinary incontinence 12/02/2019  . Macrocytosis 12/01/2019  . Nutritional anemia 12/01/2019  . S/P total knee replacement 01/27/2019  . Recurrent left knee instability 07/05/2018  . Respiratory failure with hypoxia ([place]) 08/30/2017  . Hypoxemia   . Heart failure with preserved ejection fraction ([place]), Grade 3 diastolic dysfunction 09/31/1216  . PAF (paroxysmal atrial fibrillation) ([place])   . Dyspnea 03/19/2017  . Encounter for preventive health examination 02/17/2016  . Sensorineural hearing loss (SNHL), bilateral 01/26/2016  . Morbid obesity ([place]) 06/17/2015  . Hypomagnesemia 04/24/2014  . Hemiparesis affecting left side as late effect of cerebrovascular accident ([place]) 04/24/2014  . Nontraumatic cerebral hemorrhage ([place]) 04/30/2012  . DM (diabetes mellitus) with complications ([place]) 24/46/9507  . OSTEOPENIA 01/21/2009  . UNSPECIFIED VITAMIN D DEFICIENCY 11/19/2007  . ESSENTIAL HYPERTENSION, BENIGN 11/19/2007  . HYPERCHOLESTEROLEMIA 10/25/2006  . GASTROESOPHAGEAL REFLUX, NO ESOPHAGITIS 10/25/2006  . DIVERTICULOSIS OF COLON 10/25/2006  . Osteoarthritis 10/25/2006  . CERVICAL SPINE  DISORDER, NOS 10/25/2006    Sumner Boast., PT 09/23/2020, 11:51 AM  [facility]. [place], [place], [address] Phone: [phone]   Fax:  [phone]  Name: Karen Jackson MRN: [id number] Date of Birth: 05/29/1941

## 2020-09-27 ENCOUNTER — Encounter: Payer: Self-pay | Admitting: Occupational Therapy

## 2020-09-27 ENCOUNTER — Other Ambulatory Visit: Payer: Self-pay

## 2020-09-27 ENCOUNTER — Ambulatory Visit: Payer: Medicare Other | Admitting: Physical Therapy

## 2020-09-27 ENCOUNTER — Ambulatory Visit: Payer: Medicare Other | Admitting: Occupational Therapy

## 2020-09-27 ENCOUNTER — Encounter: Payer: Self-pay | Admitting: Physical Therapy

## 2020-09-27 DIAGNOSIS — R262 Difficulty in walking, not elsewhere classified: Secondary | ICD-10-CM

## 2020-09-27 DIAGNOSIS — M6281 Muscle weakness (generalized): Secondary | ICD-10-CM | POA: Diagnosis not present

## 2020-09-27 DIAGNOSIS — R270 Ataxia, unspecified: Secondary | ICD-10-CM

## 2020-09-27 DIAGNOSIS — I6990 Unspecified sequelae of unspecified cerebrovascular disease: Secondary | ICD-10-CM

## 2020-09-27 DIAGNOSIS — R278 Other lack of coordination: Secondary | ICD-10-CM

## 2020-09-27 DIAGNOSIS — R293 Abnormal posture: Secondary | ICD-10-CM

## 2020-09-27 DIAGNOSIS — M25512 Pain in left shoulder: Secondary | ICD-10-CM | POA: Diagnosis not present

## 2020-09-27 DIAGNOSIS — I69354 Hemiplegia and hemiparesis following cerebral infarction affecting left non-dominant side: Secondary | ICD-10-CM | POA: Diagnosis not present

## 2020-09-27 NOTE — Therapy (Signed)
[place]. [place], [place], [address] Phone: [phone]   Fax:  [phone]  Occupational Therapy Treatment  Patient Details  Name: Karen Jackson MRN: [id number] Date of Birth: [birth year] Referring Provider (OT): Madison Hickman   Encounter Date: 09/27/2020   OT End of Session - 09/27/20 1200    Visit Number 9    Number of Visits 13    Date for OT Re-Evaluation 10/01/20    Authorization Type Medicare    Progress Note Due on Visit 10    OT Start Time 1015    OT Stop Time 1059    OT Time Calculation (min) 44 min    Activity Tolerance Patient tolerated treatment well    Behavior During Therapy [facility] for tasks assessed/performed           Past Medical History:  Diagnosis Date  . Acute cystitis without hematuria   . Acute diastolic CHF (congestive heart failure) ([place])   . Arthritis   . Dyspnea   . Dysrhythmia   . Fever of unknown origin 03/19/2017  . Hyperlipidemia   . Hypertension    denies at preop  . Multifocal pneumonia   . Neuromuscular disorder ([place])    neuropathy left arm and foot  . Osteopenia   . Paralysis ([place])    partial left side from CVA   . Persistent atrial fibrillation ([place])   . PONV (postoperative nausea and vomiting)   . Pre-diabetes   . Stroke [facility]) 2013   hemmorahgic    Past Surgical History:  Procedure Laterality Date  . ANKLE SURGERY    . APPENDECTOMY    . CHOLECYSTECTOMY    . HERNIA REPAIR     Esophagus  . JOINT REPLACEMENT     total- right partial- left  . MASTECTOMY PARTIAL / LUMPECTOMY  2012   left  . ORIF ANKLE FRACTURE Left 07/20/2018   Procedure: OPEN REDUCTION INTERNAL FIXATION (ORIF) ANKLE FRACTURE;  Surgeon: Wylene Simmer, MD;  Location: [facility];  Service: Orthopedics;  Laterality: Left;  . TOTAL KNEE ARTHROPLASTY Left 01/27/2019   Procedure: TOTAL KNEE ARTHROPLASTY;  Surgeon: Vickey Huger, MD;  [facility];  Service: Orthopedics;  Laterality: Left;     There were no vitals filed for this visit.   Subjective Assessment - 09/27/20 1024    Subjective  "My shoulder was been hurting this weekend"    Pertinent History CVA with L hemiparesis (2013), OA, HTN, CHF, pre-diabetes    Limitations Pain in LUE, impaired sensation, decreased strength and coordination, decreased PROM/AROM    Patient Stated Goals Improve functional use of LUE, decrease pain    Currently in Pain? Yes    Pain Score 7     Pain Location Shoulder    Pain Orientation Left    Pain Descriptors / Indicators Aching    Pain Type Chronic pain    Pain Radiating Towards "shoulder to elbow is the worst"    Pain Frequency Intermittent             OT Treatments/Exercises (OP) - 09/27/20 1248      Wrist Exercises   Other wrist exercises [place]; Left wrist flexion; 4 sets of 5 reps   Pt demo'd facilitation of wrist flexion movement at shoulder which caused pain; OT provided significant support at shoulder and incorporated v/c to avoid pt using shoulder to initiate movement and decrease pain     Hand Exercises   MCPJ Flexion Strengthening;Left;5 reps  4th/5th digit MCP flexion against light resistance with OT support under forearm and at shoulder to prevent compensatory movements; Completed 4 sets of 5 reps     Neurological Re-education Exercises   Other Grasp and Release Exercises  Grasp and release of small, differently size balls and hand exercisers to facilitate improved release with L hand   Pt instructed to open hand completely before retrieving each object to ensure use of full grasp; pt demo'd ability to self-correct when movements were too quick or when not using whole hand     Modalities   Modalities Moist Heat   OT discussed/demonstrated joint protection and pain management strategies during moist heat therapy     Moist Heat Therapy   Number Minutes Moist Heat 15 Minutes    Moist Heat Location Shoulder      Splinting   Splinting OT donned LUE brace for  shoulder support at start of session             OT Education - 09/27/20 1158    Education Details Education provided on joint protection/pain management strategies for L shoulder; OT also discussed trialing platform attachment for walker. Putty exercise for wrist strengthening added to HEP.    Person(s) Educated Patient    Methods Explanation;Demonstration    Comprehension Verbalized understanding;Returned demonstration             OT Long Term Goals - 08/17/20 0850      OT LONG TERM GOAL #1   Title Pt will demonstrate/verbalize understanding of compensation and joint protection strategies to decrease pain and increase independence during ADLs.    Baseline Currently Mod A with dressing; participation in ADLs limited by pain    Time 6    Period Weeks    Status On-going      OT LONG TERM GOAL #2   Title Pt will improve functional FM coordination of as evidenced by ability to place >50% of pegs into pegboard with LUE.    Baseline Unable to place small pegs with LUE during eval.    Time 6    Period Weeks    Status On-going      OT LONG TERM GOAL #3   Title Pt will be independent with HEP for BUE strengthening and GM coordination to improve independence with functional tasks.    Baseline Pt reports no HEP for UE strength/coordination.    Time 6    Period Weeks    Status On-going      OT LONG TERM GOAL #4   Title Pt will improve functional grasp of L hand as evidenced by increasing grip strength by >9 lbs.    Baseline LUE = 11 lbs; RUE = 33 lbs    Time 6    Period Weeks    Status On-going             Plan - 09/27/20 1200    Clinical Impression Statement Due to pain in L shoulder that pt reports has been present for a few days, session focused around hand and wrist strengthening as well as problem-solving pain management/joint protection strategies to implement at home. Pt reports pain is most significant when walking at home (with RW) and that the brace and support at  the shoulder and/or elbow help alleviate pain.    OT Occupational Profile and History Problem Focused Assessment - Including review of records relating to presenting problem    Occupational performance deficits (Please refer to evaluation for details): ADL's;IADL's  Body Structure / Function / Physical Skills ADL;UE functional use;Balance;Body mechanics;Pain;FMC;Proprioception;ROM;Coordination;GMC;Sensation;Strength;Dexterity    Rehab Potential Fair    Clinical Decision Making Several treatment options, min-mod task modification necessary    Comorbidities Affecting Occupational Performance: May have comorbidities impacting occupational performance    Modification or Assistance to Complete Evaluation  Min-Moderate modification of tasks or assist with assess necessary to complete eval    OT Frequency 2x / week    OT Duration 6 weeks    OT Treatment/Interventions Self-care/ADL training;Ultrasound;Compression bandaging;DME and/or AE instruction;Patient/family education;Passive range of motion;Electrical Stimulation;Splinting;Moist Heat;Therapeutic exercise;Manual Therapy;Therapeutic activities;Neuromuscular education;Energy conservation;Iontophoresis;Cryotherapy;Functional Mobility Training;Aquatic Therapy    Plan continue with PoC    Consulted and Agree with Plan of Care Patient           Patient will benefit from skilled therapeutic intervention in order to improve the following deficits and impairments:   Body Structure / Function / Physical Skills: ADL,UE functional use,Balance,Body mechanics,Pain,FMC,Proprioception,ROM,Coordination,GMC,Sensation,Strength,Dexterity       Visit Diagnosis: Hemiplegia and hemiparesis following cerebral infarction affecting left non-dominant side (HCC)  Muscle weakness (generalized)  Ataxia  Other lack of coordination  Abnormal posture    Problem List Patient Active Problem List   Diagnosis Date Noted  . Presbycusis of both ears 03/08/2020  .  Mixed stress and urge urinary incontinence 12/02/2019  . Macrocytosis 12/01/2019  . Nutritional anemia 12/01/2019  . S/P total knee replacement 01/27/2019  . Recurrent left knee instability 07/05/2018  . Respiratory failure with hypoxia ([place]) 08/30/2017  . Hypoxemia   . Heart failure with preserved ejection fraction ([place]), Grade 3 diastolic dysfunction [id number]  . PAF (paroxysmal atrial fibrillation) ([place])   . Dyspnea 03/19/2017  . Encounter for preventive health examination 02/17/2016  . Sensorineural hearing loss (SNHL), bilateral 01/26/2016  . Morbid obesity ([place]) 06/17/2015  . Hypomagnesemia 04/24/2014  . Hemiparesis affecting left side as late effect of cerebrovascular accident ([place]) 04/24/2014  . Nontraumatic cerebral hemorrhage ([place]) 04/30/2012  . DM (diabetes mellitus) with complications ([place]) 0000000  . OSTEOPENIA 01/21/2009  . UNSPECIFIED VITAMIN D DEFICIENCY 11/19/2007  . ESSENTIAL HYPERTENSION, BENIGN 11/19/2007  . HYPERCHOLESTEROLEMIA 10/25/2006  . GASTROESOPHAGEAL REFLUX, NO ESOPHAGITIS 10/25/2006  . DIVERTICULOSIS OF COLON 10/25/2006  . Osteoarthritis 10/25/2006  . CERVICAL SPINE DISORDER, NOS 10/25/2006     Kathrine Cords, OTR/L, MSOT 09/27/2020, 1:01 PM  [place]. [place], [place], [address] Phone: [phone]   Fax:  [phone]  Name: Karen Jackson MRN: [id number] Date of Birth: [birth year]

## 2020-09-27 NOTE — Therapy (Signed)
[place]. [place], [place], [address] Phone: [phone]   Fax:  [phone]  Physical Therapy Treatment  Patient Details  Name: RUBEN PYKA MRN: [id number] Date of Birth: 1941-03-30 Referring Provider (PT): Hensel   Encounter Date: 09/27/2020   PT End of Session - 09/27/20 1140    Visit Number 28    Date for PT Re-Evaluation 10/18/20    PT Start Time 1100    PT Stop Time 1141    PT Time Calculation (min) 41 min    Activity Tolerance Patient tolerated treatment well    Behavior During Therapy [facility] for tasks assessed/performed           Past Medical History:  Diagnosis Date  . Acute cystitis without hematuria   . Acute diastolic CHF (congestive heart failure) ([place])   . Arthritis   . Dyspnea   . Dysrhythmia   . Fever of unknown origin 03/19/2017  . Hyperlipidemia   . Hypertension    denies at preop  . Multifocal pneumonia   . Neuromuscular disorder ([place])    neuropathy left arm and foot  . Osteopenia   . Paralysis ([place])    partial left side from CVA   . Persistent atrial fibrillation ([place])   . PONV (postoperative nausea and vomiting)   . Pre-diabetes   . Stroke [facility]) 2013   hemmorahgic    Past Surgical History:  Procedure Laterality Date  . ANKLE SURGERY    . APPENDECTOMY    . CHOLECYSTECTOMY    . HERNIA REPAIR     Esophagus  . JOINT REPLACEMENT     total- right partial- left  . MASTECTOMY PARTIAL / LUMPECTOMY  2012   left  . ORIF ANKLE FRACTURE Left 07/20/2018   Procedure: OPEN REDUCTION INTERNAL FIXATION (ORIF) ANKLE FRACTURE;  Surgeon: Wylene Simmer, MD;  Location: [facility];  Service: Orthopedics;  Laterality: Left;  . TOTAL KNEE ARTHROPLASTY Left 01/27/2019   Procedure: TOTAL KNEE ARTHROPLASTY;  Surgeon: Vickey Huger, MD;  [facility];  Service: Orthopedics;  Laterality: Left;    There were no vitals filed for this visit.   Subjective Assessment - 09/27/20 1112    Subjective  Patient reports that the cold has caused her joints to ache more.  "I did make a point to walk this weekend with caregiver"    Currently in Pain? Yes    Pain Score 7     Pain Location Shoulder    Pain Relieving Factors heat and the brace help the shoulder                             [facility] Adult PT Treatment/Exercise - 09/27/20 0001      Ambulation/Gait   Gait Comments FWW 2x75, 2x50' and 1x 150' with CGA, occasional support of the left elbow due to left shoulder pain      Knee/Hip Exercises: Aerobic   Nustep level 6 x 500 steps 8 minutes      Knee/Hip Exercises: Standing   Other Standing Knee Exercises 4" toe touches      Knee/Hip Exercises: Seated   Long Arc Quad Both;3 sets;10 reps    Long Arc Quad Weight 5 lbs.    Other Seated Knee/Hip Exercises left hip adduction green tband, red tband ankle PF/DF    Marching Both;2 sets;10 reps    Marching Weights 5 lbs.    Hamstring Curl Both;3 sets;10  reps    Hamstring Limitations green tband                    PT Short Term Goals - 06/18/20 1105      PT SHORT TERM GOAL #1   Title independent with initial HEP    Status Partially Met             PT Long Term Goals - 09/23/20 1150      PT LONG TERM GOAL #1   Title walk with hand hold assist x 100 feet    Status On-going      PT LONG TERM GOAL #2   Title increased right LE strength to 4-/5    Status Partially Met      PT LONG TERM GOAL #3   Title walk 100 feet with SBA using the FWW    Status Partially Met                 Plan - 09/27/20 1141    Clinical Impression Statement The 150' walked today is her limit at this time, she was very fatigued, SOB and at the end was a little unsafe on her turn around to sit.  Requiring a little more than CGA at tthat time.    PT Next Visit Plan work on returning her to functional gait and recovering from pneumonia    Consulted and Agree with Plan of Care Patient           Patient will benefit  from skilled therapeutic intervention in order to improve the following deficits and impairments:  Abnormal gait,Cardiopulmonary status limiting activity,Decreased activity tolerance,Decreased balance,Decreased mobility,Decreased strength,Decreased coordination,Decreased range of motion,Difficulty walking,Increased muscle spasms,Pain  Visit Diagnosis: Hemiplegia and hemiparesis following cerebral infarction affecting left non-dominant side (HCC)  Muscle weakness (generalized)  Ataxia  Other lack of coordination  Abnormal posture  Difficulty in walking, not elsewhere classified  Late effects of CVA (cerebrovascular accident)  Ataxia, unspecified     Problem List Patient Active Problem List   Diagnosis Date Noted  . Presbycusis of both ears 03/08/2020  . Mixed stress and urge urinary incontinence 12/02/2019  . Macrocytosis 12/01/2019  . Nutritional anemia 12/01/2019  . S/P total knee replacement 01/27/2019  . Recurrent left knee instability 07/05/2018  . Respiratory failure with hypoxia ([place]) 08/30/2017  . Hypoxemia   . Heart failure with preserved ejection fraction ([place]), Grade 3 diastolic dysfunction 88/28/0034  . PAF (paroxysmal atrial fibrillation) ([place])   . Dyspnea 03/19/2017  . Encounter for preventive health examination 02/17/2016  . Sensorineural hearing loss (SNHL), bilateral 01/26/2016  . Morbid obesity ([place]) 06/17/2015  . Hypomagnesemia 04/24/2014  . Hemiparesis affecting left side as late effect of cerebrovascular accident ([place]) 04/24/2014  . Nontraumatic cerebral hemorrhage ([place]) 04/30/2012  . DM (diabetes mellitus) with complications ([place]) 91/79/1505  . OSTEOPENIA 01/21/2009  . UNSPECIFIED VITAMIN D DEFICIENCY 11/19/2007  . ESSENTIAL HYPERTENSION, BENIGN 11/19/2007  . HYPERCHOLESTEROLEMIA 10/25/2006  . GASTROESOPHAGEAL REFLUX, NO ESOPHAGITIS 10/25/2006  . DIVERTICULOSIS OF COLON 10/25/2006  . Osteoarthritis 10/25/2006  . CERVICAL SPINE DISORDER, NOS  10/25/2006    Sumner Boast., PT 09/27/2020, 11:44 AM  [place]. [place], [place], [address] Phone: [phone]   Fax:  [phone]  Name: SAMELLA LUCCHETTI MRN: [id number] Date of Birth: Jan 28, 1941

## 2020-09-29 DIAGNOSIS — L821 Other seborrheic keratosis: Secondary | ICD-10-CM | POA: Diagnosis not present

## 2020-09-29 DIAGNOSIS — Z85828 Personal history of other malignant neoplasm of skin: Secondary | ICD-10-CM | POA: Diagnosis not present

## 2020-09-29 DIAGNOSIS — L720 Epidermal cyst: Secondary | ICD-10-CM | POA: Diagnosis not present

## 2020-09-30 ENCOUNTER — Ambulatory Visit: Payer: Medicare Other | Admitting: Occupational Therapy

## 2020-09-30 ENCOUNTER — Ambulatory Visit: Payer: Medicare Other | Admitting: Physical Therapy

## 2020-10-01 ENCOUNTER — Telehealth: Payer: Self-pay

## 2020-10-01 NOTE — Telephone Encounter (Signed)
Noted and agree. 

## 2020-10-01 NOTE — Telephone Encounter (Signed)
Patient calls nurse line regarding possible side effects related to increased ozempic dosage. Patient reports that after increasing to 0.5 mg, she has been experiencing blurred vision, dizziness and nausea. Patient has noticed this a few hours after injection, then symptoms subside. Spoke with Dr. Andria Frames regarding concerns. Advised that patient could decrease dosage for now, however, would likely increase in the future to see if symptoms continue. Patient scheduled follow up with PCP on 2/17.   Instructed patient to return call to office if symptoms persist.   [place] Grumbling, RN

## 2020-10-05 ENCOUNTER — Encounter: Payer: Self-pay | Admitting: Occupational Therapy

## 2020-10-05 ENCOUNTER — Other Ambulatory Visit: Payer: Self-pay

## 2020-10-05 ENCOUNTER — Encounter: Payer: Self-pay | Admitting: Physical Therapy

## 2020-10-05 ENCOUNTER — Ambulatory Visit: Payer: Medicare Other | Admitting: Physical Therapy

## 2020-10-05 ENCOUNTER — Ambulatory Visit: Payer: Medicare Other | Attending: Family Medicine | Admitting: Occupational Therapy

## 2020-10-05 DIAGNOSIS — M6281 Muscle weakness (generalized): Secondary | ICD-10-CM | POA: Diagnosis not present

## 2020-10-05 DIAGNOSIS — R278 Other lack of coordination: Secondary | ICD-10-CM | POA: Diagnosis not present

## 2020-10-05 DIAGNOSIS — I69354 Hemiplegia and hemiparesis following cerebral infarction affecting left non-dominant side: Secondary | ICD-10-CM

## 2020-10-05 DIAGNOSIS — R293 Abnormal posture: Secondary | ICD-10-CM

## 2020-10-05 DIAGNOSIS — R262 Difficulty in walking, not elsewhere classified: Secondary | ICD-10-CM | POA: Diagnosis not present

## 2020-10-05 DIAGNOSIS — R270 Ataxia, unspecified: Secondary | ICD-10-CM

## 2020-10-05 DIAGNOSIS — I6990 Unspecified sequelae of unspecified cerebrovascular disease: Secondary | ICD-10-CM

## 2020-10-05 DIAGNOSIS — M25512 Pain in left shoulder: Secondary | ICD-10-CM | POA: Diagnosis not present

## 2020-10-05 NOTE — Therapy (Signed)
[place]. [place], [place], [address] Phone: [phone]   Fax:  [phone]  Physical Therapy Treatment  Patient Details  Name: Karen Jackson MRN: [id number] Date of Birth: 04-19-1941 Referring Provider (PT): Hensel   Encounter Date: 10/05/2020   PT End of Session - 10/05/20 1139    Visit Number 29    Date for PT Re-Evaluation 10/18/20    PT Start Time 1100    PT Stop Time 1142    PT Time Calculation (min) 42 min    Activity Tolerance Patient tolerated treatment well    Behavior During Therapy [facility] for tasks assessed/performed           Past Medical History:  Diagnosis Date  . Acute cystitis without hematuria   . Acute diastolic CHF (congestive heart failure) ([place])   . Arthritis   . Dyspnea   . Dysrhythmia   . Fever of unknown origin 03/19/2017  . Hyperlipidemia   . Hypertension    denies at preop  . Multifocal pneumonia   . Neuromuscular disorder ([place])    neuropathy left arm and foot  . Osteopenia   . Paralysis ([place])    partial left side from CVA   . Persistent atrial fibrillation ([place])   . PONV (postoperative nausea and vomiting)   . Pre-diabetes   . Stroke [facility]) 2013   hemmorahgic    Past Surgical History:  Procedure Laterality Date  . ANKLE SURGERY    . APPENDECTOMY    . CHOLECYSTECTOMY    . HERNIA REPAIR     Esophagus  . JOINT REPLACEMENT     total- right partial- left  . MASTECTOMY PARTIAL / LUMPECTOMY  2012   left  . ORIF ANKLE FRACTURE Left 07/20/2018   Procedure: OPEN REDUCTION INTERNAL FIXATION (ORIF) ANKLE FRACTURE;  Surgeon: Wylene Simmer, MD;  Location: [facility];  Service: Orthopedics;  Laterality: Left;  . TOTAL KNEE ARTHROPLASTY Left 01/27/2019   Procedure: TOTAL KNEE ARTHROPLASTY;  Surgeon: Vickey Huger, MD;  [facility];  Service: Orthopedics;  Laterality: Left;    There were no vitals filed for this visit.   Subjective Assessment - 10/05/20 1127    Subjective Still  with joint aches and pains, had an issue last week with blurred vision, nausea and dizziness, she reports that it cxould be a side effect of a medication                             [facility] Adult PT Treatment/Exercise - 10/05/20 0001      Ambulation/Gait   Gait Comments FWW CGA 75' then 150 feet, very SOB      Knee/Hip Exercises: Aerobic   Nustep level 5 x 500 steps 7 minutes      Knee/Hip Exercises: Standing   Hip Flexion Both;2 sets;10 reps    Hip Flexion Limitations 5#    Hip Abduction Both;1 set;10 reps    Abduction Limitations 2#    Other Standing Knee Exercises lifting walker up and over a dowel front wheels and back legs back and forth      Knee/Hip Exercises: Seated   Long Arc Quad Both;3 sets;10 reps    Long Arc Quad Weight 5 lbs.    Other Seated Knee/Hip Exercises green tband ankle PF/DF, left ankle on sit fit motions    Marching Both;2 sets;10 reps    Marching Weights 5 lbs.  PT Short Term Goals - 06/18/20 1105      PT SHORT TERM GOAL #1   Title independent with initial HEP    Status Partially Met             PT Long Term Goals - 09/23/20 1150      PT LONG TERM GOAL #1   Title walk with hand hold assist x 100 feet    Status On-going      PT LONG TERM GOAL #2   Title increased right LE strength to 4-/5    Status Partially Met      PT LONG TERM GOAL #3   Title walk 100 feet with SBA using the FWW    Status Partially Met                 Plan - 10/05/20 1139    Clinical Impression Statement Patient again was able to walk 150 feet, again this is very difficult for her and she needed one small standing rest break, she was very SOB after this and needed a few minutes to recover.  I added weight on some of the LE exercises today, she did have an incident last week with some blurred vision and dizziness, she called her MD and reports that it may be a medication side effect    PT Next Visit Plan would like to  continue to push her limits to get her to a higher independence and functional level    Consulted and Agree with Plan of Care Patient           Patient will benefit from skilled therapeutic intervention in order to improve the following deficits and impairments:  Abnormal gait,Cardiopulmonary status limiting activity,Decreased activity tolerance,Decreased balance,Decreased mobility,Decreased strength,Decreased coordination,Decreased range of motion,Difficulty walking,Increased muscle spasms,Pain  Visit Diagnosis: Hemiplegia and hemiparesis following cerebral infarction affecting left non-dominant side (HCC)  Muscle weakness (generalized)  Ataxia  Other lack of coordination  Abnormal posture  Difficulty in walking, not elsewhere classified  Late effects of CVA (cerebrovascular accident)     Problem List Patient Active Problem List   Diagnosis Date Noted  . Presbycusis of both ears 03/08/2020  . Mixed stress and urge urinary incontinence 12/02/2019  . Macrocytosis 12/01/2019  . Nutritional anemia 12/01/2019  . S/P total knee replacement 01/27/2019  . Recurrent left knee instability 07/05/2018  . Respiratory failure with hypoxia ([place]) 08/30/2017  . Hypoxemia   . Heart failure with preserved ejection fraction ([place]), Grade 3 diastolic dysfunction 09/40/7680  . PAF (paroxysmal atrial fibrillation) ([place])   . Dyspnea 03/19/2017  . Encounter for preventive health examination 02/17/2016  . Sensorineural hearing loss (SNHL), bilateral 01/26/2016  . Morbid obesity ([place]) 06/17/2015  . Hypomagnesemia 04/24/2014  . Hemiparesis affecting left side as late effect of cerebrovascular accident ([place]) 04/24/2014  . Nontraumatic cerebral hemorrhage ([place]) 04/30/2012  . DM (diabetes mellitus) with complications ([place]) 88/06/314  . OSTEOPENIA 01/21/2009  . UNSPECIFIED VITAMIN D DEFICIENCY 11/19/2007  . ESSENTIAL HYPERTENSION, BENIGN 11/19/2007  . HYPERCHOLESTEROLEMIA 10/25/2006  .  GASTROESOPHAGEAL REFLUX, NO ESOPHAGITIS 10/25/2006  . DIVERTICULOSIS OF COLON 10/25/2006  . Osteoarthritis 10/25/2006  . CERVICAL SPINE DISORDER, NOS 10/25/2006    Sumner Boast., PT 10/05/2020, 11:44 AM  [place]. [place], [place], [address] Phone: [phone]   Fax:  [phone]  Name: Karen Jackson MRN: [id number] Date of Birth: August 05, 1941

## 2020-10-05 NOTE — Therapy (Signed)
[facility]. [place], [place], [address] Phone: [phone]   Fax:  [phone]  Occupational Therapy Treatment  Patient Details  Name: Karen Jackson MRN: [id number] Date of Birth: [birth year] Referring Provider (OT): Madison Hickman   Encounter Date: 10/05/2020   OT End of Session - 10/05/20 1023    Visit Number 10    Number of Visits 13    Date for OT Re-Evaluation 11/05/20    Authorization Type Medicare    Progress Note Due on Visit 10    OT Start Time 1015    OT Stop Time 1058    OT Time Calculation (min) 43 min    Activity Tolerance Patient tolerated treatment well    Behavior During Therapy [facility] for tasks assessed/performed           Past Medical History:  Diagnosis Date  . Acute cystitis without hematuria   . Acute diastolic CHF (congestive heart failure) ([place])   . Arthritis   . Dyspnea   . Dysrhythmia   . Fever of unknown origin 03/19/2017  . Hyperlipidemia   . Hypertension    denies at preop  . Multifocal pneumonia   . Neuromuscular disorder ([place])    neuropathy left arm and foot  . Osteopenia   . Paralysis ([place])    partial left side from CVA   . Persistent atrial fibrillation ([place])   . PONV (postoperative nausea and vomiting)   . Pre-diabetes   . Stroke [facility]) 2013   hemmorahgic    Past Surgical History:  Procedure Laterality Date  . ANKLE SURGERY    . APPENDECTOMY    . CHOLECYSTECTOMY    . HERNIA REPAIR     Esophagus  . JOINT REPLACEMENT     total- right partial- left  . MASTECTOMY PARTIAL / LUMPECTOMY  2012   left  . ORIF ANKLE FRACTURE Left 07/20/2018   Procedure: OPEN REDUCTION INTERNAL FIXATION (ORIF) ANKLE FRACTURE;  Surgeon: Wylene Simmer, MD;  Location: [place];  Service: Orthopedics;  Laterality: Left;  . TOTAL KNEE ARTHROPLASTY Left 01/27/2019   Procedure: TOTAL KNEE ARTHROPLASTY;  Surgeon: Vickey Huger, MD;  Location: [facility];  Service: Orthopedics;  Laterality: Left;     There were no vitals filed for this visit.   Subjective Assessment - 10/05/20 1023    Subjective  Pt reports her niece is wanting to get a lightweight wheelchair for her to improve ease with community mobility, but that pt is concerned she does not need it.    Pertinent History CVA with L hemiparesis (2013), OA, HTN, CHF, pre-diabetes    Limitations Pain in LUE, impaired sensation, decreased strength and coordination, decreased PROM/AROM    Patient Stated Goals Improve functional use of LUE, decrease pain    Currently in Pain? Yes    Pain Score 4     Pain Location Shoulder    Pain Orientation Left    Pain Descriptors / Indicators Pins and needles;Dull    Pain Type Chronic pain    Pain Radiating Towards "I've had some tingling in my shoulders since this morning"    Pain Onset More than a month ago    Pain Frequency Intermittent            OT Treatments/Exercises (OP) - 10/05/20 1426      Shoulder Exercises: ROM/Strengthening   Pendulum OT attempted pendulum/self-PROM of L shoulder; pt demo'd difficulty flexing trunk forward far enough to dangle arm without supporting elbow  on knee and activity was d/c      Neurological Re-education Exercises   Other Grasp and Release Exercises  Grasp and release of small, differently sized pegs to facilitate improved FMC with L hand   Min v/c required for opening hand completely when releasing each peg; pt demo'd ability to self-correct when movements were too quick or when not using whole hand   Towel Slides Modified towel slides using pillow on therapy mat positioned slightly below tabletop height; 10x2 with rest break in between sets. Pt able to reach almost 80 degrees of bilateral shoulder forward flexion without pain.   Toward end of second set, pt reported sharp pain in L shoulder with extension back to neutral; pain remained unchanged with decreased shoulder activation and additional support from OT and activity was d/c     Modalities    Modalities Moist Heat   Modified towel slides activity completed during moist heat therapy     Moist Heat Therapy   Number Minutes Moist Heat 15 Minutes    Moist Heat Location Shoulder      Splinting   Splinting OT reviewed donning of LUE shoulder support brace with caregiver at start of session            OT Education - 10/05/20 1449    Education Details OT researched and discussed differences between current manual w/c and potential lightweight transport w/c with pt    Person(s) Educated Patient    Methods Explanation    Comprehension Verbalized understanding            OT Short Term Goals - 10/05/20 1527      OT SHORT TERM GOAL #1   Title Pt will don/doff zip-up jacket with Min A in at least 1/2 trials.    Baseline Mod A to don/doff zip-up jacket    Time 3    Period Weeks    Status New    Target Date 10/29/20      OT SHORT TERM GOAL #2   Title Pt will report pain less than 3/10 in LUE when ambulating with RW at least 75% of the time.    Baseline Pt reports pain in LUE when ambulating with RW in the home.    Time 3    Period Weeks    Status New            OT Long Term Goals - 10/05/20 1424      OT LONG TERM GOAL #1   Title Pt will demonstrate/verbalize understanding of compensation and joint protection strategies to decrease pain and increase independence during ADLs.    Baseline Currently Mod A with dressing; participation in ADLs limited by pain    Time 6    Period Weeks    Status On-going    Target Date 11/19/20      OT LONG TERM GOAL #2   Title Pt will improve functional FM coordination of as evidenced by ability to place >50% of pegs into pegboard with LUE.    Baseline Unable to place small pegs with LUE during eval.    Time 6    Period Weeks    Status Achieved   Pt able to place ~75% of pegs into pegboard without dropping (10/05/20)     OT LONG TERM GOAL #3   Title Pt will be independent with HEP for BUE strengthening and GM coordination to improve  independence with functional tasks.    Baseline Pt reports no HEP for UE strength/coordination.  Time 6    Period Weeks    Status Partially Met   Pt reports compliance with current HEP (10/05/20)     OT LONG TERM GOAL #4   Title Pt will improve functional grasp of L hand as evidenced by increasing grip strength by >9 lbs.    Baseline LUE = 11 lbs; RUE = 33 lbs    Time 6    Period Weeks    Status On-going            Plan - 10/05/20 1416    Clinical Impression Statement Pt tolerated therapeutic activities well this session and was able to move through greater pain-free ROM for bilateral shoulder flexion compared with previous sessions; pain in L shoulder does increase with repetition. Ataxia with [facility] continues to be limiting, but appears to have improved slightly with continued exercises and cuing for implementation of compensatory strategies.    OT Occupational Profile and History Problem Focused Assessment - Including review of records relating to presenting problem    Occupational performance deficits (Please refer to evaluation for details): ADL's;IADL's    Body Structure / Function / Physical Skills ADL;UE functional use;Balance;Body mechanics;Pain;FMC;Proprioception;ROM;Coordination;GMC;Sensation;Strength;Dexterity    Rehab Potential Fair    Clinical Decision Making Several treatment options, min-mod task modification necessary    Comorbidities Affecting Occupational Performance: May have comorbidities impacting occupational performance    Modification or Assistance to Complete Evaluation  Min-Moderate modification of tasks or assist with assess necessary to complete eval    OT Frequency 2x / week    OT Duration 6 weeks    OT Treatment/Interventions Self-care/ADL training;Ultrasound;Compression bandaging;DME and/or AE instruction;Patient/family education;Passive range of motion;Electrical Stimulation;Splinting;Moist Heat;Therapeutic exercise;Manual Therapy;Therapeutic  activities;Neuromuscular education;Energy conservation;Iontophoresis;Cryotherapy;Functional Mobility Training;Aquatic Therapy    Plan continue with PoC    Consulted and Agree with Plan of Care Patient           Patient will benefit from skilled therapeutic intervention in order to improve the following deficits and impairments:   Body Structure / Function / Physical Skills: ADL,UE functional use,Balance,Body mechanics,Pain,FMC,Proprioception,ROM,Coordination,GMC,Sensation,Strength,Dexterity      Occupational Therapy Progress Note  Dates of Reporting Period: 08/09/20 to 10/05/20  Objective Reports of Subjective Statement: Pt reports L shoulder continues to be limiting during functional activities when pain level becomes significant. Pt is receptive to trials of AE/devices as needed.  Objective Measurements: Pt demo'd improved ability to consistently place large pegs into pegboard and manipulate small game pieces into game base using L hand with v/c to slow down movements and grasp and release with full hand. Pt continuing to work toward increasing use of functional grasp with L hand, implementing compensatory/adaptive strategies at home, and improving participation in functional BADLs.  Goal Update: Pt has met 1/4 LTG, partially met 1/4 LTG, and continues to progress toward remaining LTGs. 2 new STGs added to POC.  Plan: Continue to address limitations during functional daily tasks/activities to improve participation in ADLs.  Reason Skilled Services are Required: Pt continues to demonstrate difficulty with functional activities due to decreased coordination, strength, ROM, and FMC of LUE, impacting independence at home.    Visit Diagnosis: Hemiplegia and hemiparesis following cerebral infarction affecting left non-dominant side (HCC)  Muscle weakness (generalized)  Ataxia  Other lack of coordination  Abnormal posture    Problem List Patient Active Problem List   Diagnosis  Date Noted  . Presbycusis of both ears 03/08/2020  . Mixed stress and urge urinary incontinence 12/02/2019  . Macrocytosis 12/01/2019  . Nutritional anemia  12/01/2019  . S/P total knee replacement 01/27/2019  . Recurrent left knee instability 07/05/2018  . Respiratory failure with hypoxia ([place]) 08/30/2017  . Hypoxemia   . Heart failure with preserved ejection fraction ([place]), Grade 3 diastolic dysfunction 28/36/6294  . PAF (paroxysmal atrial fibrillation) ([place])   . Dyspnea 03/19/2017  . Encounter for preventive health examination 02/17/2016  . Sensorineural hearing loss (SNHL), bilateral 01/26/2016  . Morbid obesity ([place]) 06/17/2015  . Hypomagnesemia 04/24/2014  . Hemiparesis affecting left side as late effect of cerebrovascular accident ([place]) 04/24/2014  . Nontraumatic cerebral hemorrhage ([place]) 04/30/2012  . DM (diabetes mellitus) with complications ([place]) 76/54/6503  . OSTEOPENIA 01/21/2009  . UNSPECIFIED VITAMIN D DEFICIENCY 11/19/2007  . ESSENTIAL HYPERTENSION, BENIGN 11/19/2007  . HYPERCHOLESTEROLEMIA 10/25/2006  . GASTROESOPHAGEAL REFLUX, NO ESOPHAGITIS 10/25/2006  . DIVERTICULOSIS OF COLON 10/25/2006  . Osteoarthritis 10/25/2006  . CERVICAL SPINE DISORDER, NOS 10/25/2006     Kathrine Cords, OTR/L, MSOT 10/05/2020, 3:40 PM  [facility]. [place], [place], [address] Phone: [phone]   Fax:  [phone]  Name: Karen Jackson MRN: [id number] Date of Birth: 17-Feb-1941

## 2020-10-07 ENCOUNTER — Ambulatory Visit: Payer: Medicare Other | Admitting: Physical Therapy

## 2020-10-07 ENCOUNTER — Other Ambulatory Visit: Payer: Self-pay

## 2020-10-07 ENCOUNTER — Encounter: Payer: Self-pay | Admitting: Physical Therapy

## 2020-10-07 ENCOUNTER — Ambulatory Visit: Payer: Medicare Other | Admitting: Occupational Therapy

## 2020-10-07 ENCOUNTER — Encounter: Payer: Self-pay | Admitting: Occupational Therapy

## 2020-10-07 DIAGNOSIS — M6281 Muscle weakness (generalized): Secondary | ICD-10-CM

## 2020-10-07 DIAGNOSIS — I69354 Hemiplegia and hemiparesis following cerebral infarction affecting left non-dominant side: Secondary | ICD-10-CM

## 2020-10-07 DIAGNOSIS — R270 Ataxia, unspecified: Secondary | ICD-10-CM

## 2020-10-07 DIAGNOSIS — R293 Abnormal posture: Secondary | ICD-10-CM | POA: Diagnosis not present

## 2020-10-07 DIAGNOSIS — R262 Difficulty in walking, not elsewhere classified: Secondary | ICD-10-CM | POA: Diagnosis not present

## 2020-10-07 DIAGNOSIS — R278 Other lack of coordination: Secondary | ICD-10-CM

## 2020-10-07 DIAGNOSIS — I6990 Unspecified sequelae of unspecified cerebrovascular disease: Secondary | ICD-10-CM

## 2020-10-07 NOTE — Therapy (Signed)
[facility]. [place], [place], [address] Phone: [phone]   Fax:  [phone]  Occupational Therapy Treatment  Patient Details  Name: Karen Jackson MRN: [id number] Date of Birth: 1940-11-02 Referring Provider (OT): Madison Hickman   Encounter Date: 10/07/2020   OT End of Session - 10/07/20 1254    Visit Number 11    Number of Visits 19    Date for OT Re-Evaluation 11/05/20    Authorization Type Medicare    Progress Note Due on Visit 20    OT Start Time 1015    OT Stop Time 1059    OT Time Calculation (min) 44 min    Activity Tolerance Patient tolerated treatment well;Patient limited by pain    Behavior During Therapy [facility] for tasks assessed/performed           Past Medical History:  Diagnosis Date  . Acute cystitis without hematuria   . Acute diastolic CHF (congestive heart failure) ([place])   . Arthritis   . Dyspnea   . Dysrhythmia   . Fever of unknown origin 03/19/2017  . Hyperlipidemia   . Hypertension    denies at preop  . Multifocal pneumonia   . Neuromuscular disorder ([place])    neuropathy left arm and foot  . Osteopenia   . Paralysis ([place])    partial left side from CVA   . Persistent atrial fibrillation ([place])   . PONV (postoperative nausea and vomiting)   . Pre-diabetes   . Stroke [facility]) 2013   hemmorahgic    Past Surgical History:  Procedure Laterality Date  . ANKLE SURGERY    . APPENDECTOMY    . CHOLECYSTECTOMY    . HERNIA REPAIR     Esophagus  . JOINT REPLACEMENT     total- right partial- left  . MASTECTOMY PARTIAL / LUMPECTOMY  2012   left  . ORIF ANKLE FRACTURE Left 07/20/2018   Procedure: OPEN REDUCTION INTERNAL FIXATION (ORIF) ANKLE FRACTURE;  Surgeon: Wylene Simmer, MD;  Location: [place];  Service: Orthopedics;  Laterality: Left;  . TOTAL KNEE ARTHROPLASTY Left 01/27/2019   Procedure: TOTAL KNEE ARTHROPLASTY;  Surgeon: Vickey Huger, MD;  [facility];  Service: Orthopedics;   Laterality: Left;    There were no vitals filed for this visit.   Subjective Assessment - 10/07/20 1029    Subjective  Pt mentioned she has a friend who may help her figure out if a purchasing a lightweight wheelchair could be beneficial    Pertinent History CVA with L hemiparesis (2013), OA, HTN, CHF, pre-diabetes    Limitations Pain in LUE, impaired sensation, decreased strength and coordination, decreased PROM/AROM    Patient Stated Goals Improve functional use of LUE, decrease pain    Currently in Pain? Yes    Pain Score 5     Pain Location Shoulder    Pain Orientation Left    Pain Descriptors / Indicators Pins and needles    Pain Type Chronic pain    Pain Radiating Towards "lots of pins and needles this morning"    Pain Onset More than a month ago    Pain Frequency Intermittent            OT Treatments/Exercises (OP) - 10/07/20 1328      Shoulder Exercises: Seated   Extension Strengthening;Left;10 reps;Theraband   Completed 2 sets with OT holding band out in front of pt. Due to increased pain with repetition, OT modified to isometric strengthening; pt  was unable to achieve shoulder ext and OT returned to theraband exercise and provided support under elbow   External Rotation Strengthening;Left;10 reps;Theraband   Completed with OT holding theraband on contralateral side; pt also attempted exercise with band wrapped around w/c armrest to encourage independence with HEP   Internal Rotation Strengthening;Left;10 reps;Theraband   Completed with OT holding theraband on contralateral side; pt also attempted exercise with band wrapped around w/c armrest to encourage independence with HEP   Flexion Strengthening;Left;10 reps;Theraband   Completed with OT holding theraband on ipsilateral side; pt also attempted exercise with band wrapped around w/c armrest to encourage independence with HEP     Hand Exercises   Other Hand Exercises Graded resistance clothespins used to improve FMC and  strength; OT graded activity down to place pins onto a rubber placement to improve accuracy   Level 1 (yellow) and level 2 (red); pt unable to maintain grip on level 3 (green) long enough to clip onto placemat     Modalities   Modalities Moist Heat   Theraband exercises completed during moist heat therapy     Moist Heat Therapy   Number Minutes Moist Heat 15 Minutes    Moist Heat Location Shoulder             OT Education - 10/07/20 1252    Education Details OT demo'd and practiced shoulder strengthening exercises with pt to include in HEP.    Person(s) Educated Patient    Methods Explanation;Handout;Demonstration    Comprehension Verbalized understanding;Returned demonstration            OT Short Term Goals - 10/07/20 1300      OT SHORT TERM GOAL #1   Title Pt will don/doff zip-up jacket with Min A in at least 1/2 trials.    Baseline Mod A to don/doff zip-up jacket    Time 3    Period Weeks    Status On-going    Target Date 10/29/20      OT SHORT TERM GOAL #2   Title Pt will report pain less than 3/10 in LUE when ambulating with RW at least 75% of the time.    Baseline Pt reports pain in LUE when ambulating with RW in the home.    Time 3    Period Weeks    Status On-going             OT Long Term Goals - 10/05/20 1424      OT LONG TERM GOAL #1   Title Pt will demonstrate/verbalize understanding of compensation and joint protection strategies to decrease pain and increase independence during ADLs.    Baseline Currently Mod A with dressing; participation in ADLs limited by pain    Time 6    Period Weeks    Status On-going    Target Date 11/19/20      OT LONG TERM GOAL #2   Title Pt will improve functional FM coordination of as evidenced by ability to place >50% of pegs into pegboard with LUE.    Baseline Unable to place small pegs with LUE during eval.    Time 6    Period Weeks    Status Achieved   Pt able to place ~75% of pegs into pegboard without  dropping (10/05/20)     OT LONG TERM GOAL #3   Title Pt will be independent with HEP for BUE strengthening and GM coordination to improve independence with functional tasks.    Baseline Pt reports no HEP  for UE strength/coordination.    Time 6    Period Weeks    Status Partially Met   Pt reports compliance with current HEP (10/05/20)     OT LONG TERM GOAL #4   Title Pt will improve functional grasp of L hand as evidenced by increasing grip strength by >9 lbs.    Baseline LUE = 11 lbs; RUE = 33 lbs    Time 6    Period Weeks    Status On-going             Plan - 10/07/20 1257    Clinical Impression Statement OT problem-solved with pt to solidify light strengthening exercises for L shoulder that pt felt comfortable carrying over to home; yellow theraband administered to pt. Repetition consistently causes increased pain in L shoulder, so OT encouraged pt to decrease repetitions and include breaks between sets, as needed.    OT Occupational Profile and History Problem Focused Assessment - Including review of records relating to presenting problem    Occupational performance deficits (Please refer to evaluation for details): ADL's;IADL's    Body Structure / Function / Physical Skills ADL;UE functional use;Balance;Body mechanics;Pain;FMC;Proprioception;ROM;Coordination;GMC;Sensation;Strength;Dexterity    Rehab Potential Fair    Clinical Decision Making Several treatment options, min-mod task modification necessary    Comorbidities Affecting Occupational Performance: May have comorbidities impacting occupational performance    Modification or Assistance to Complete Evaluation  Min-Moderate modification of tasks or assist with assess necessary to complete eval    OT Frequency 2x / week    OT Duration 6 weeks    OT Treatment/Interventions Self-care/ADL training;Ultrasound;Compression bandaging;DME and/or AE instruction;Patient/family education;Passive range of motion;Electrical  Stimulation;Splinting;Moist Heat;Therapeutic exercise;Manual Therapy;Therapeutic activities;Neuromuscular education;Energy conservation;Iontophoresis;Cryotherapy;Functional Mobility Training;Aquatic Therapy    Plan continue with PoC    Consulted and Agree with Plan of Care Patient           Patient will benefit from skilled therapeutic intervention in order to improve the following deficits and impairments:   Body Structure / Function / Physical Skills: ADL,UE functional use,Balance,Body mechanics,Pain,FMC,Proprioception,ROM,Coordination,GMC,Sensation,Strength,Dexterity       Visit Diagnosis: Hemiplegia and hemiparesis following cerebral infarction affecting left non-dominant side (HCC)  Muscle weakness (generalized)  Ataxia  Other lack of coordination  Abnormal posture    Problem List Patient Active Problem List   Diagnosis Date Noted  . Presbycusis of both ears 03/08/2020  . Mixed stress and urge urinary incontinence 12/02/2019  . Macrocytosis 12/01/2019  . Nutritional anemia 12/01/2019  . S/P total knee replacement 01/27/2019  . Recurrent left knee instability 07/05/2018  . Respiratory failure with hypoxia ([place]) 08/30/2017  . Hypoxemia   . Heart failure with preserved ejection fraction ([place]), Grade 3 diastolic dysfunction 59/29/2446  . PAF (paroxysmal atrial fibrillation) ([place])   . Dyspnea 03/19/2017  . Encounter for preventive health examination 02/17/2016  . Sensorineural hearing loss (SNHL), bilateral 01/26/2016  . Morbid obesity ([place]) 06/17/2015  . Hypomagnesemia 04/24/2014  . Hemiparesis affecting left side as late effect of cerebrovascular accident ([place]) 04/24/2014  . Nontraumatic cerebral hemorrhage ([place]) 04/30/2012  . DM (diabetes mellitus) with complications ([place]) 28/63/8177  . OSTEOPENIA 01/21/2009  . UNSPECIFIED VITAMIN D DEFICIENCY 11/19/2007  . ESSENTIAL HYPERTENSION, BENIGN 11/19/2007  . HYPERCHOLESTEROLEMIA 10/25/2006  . GASTROESOPHAGEAL REFLUX,  NO ESOPHAGITIS 10/25/2006  . DIVERTICULOSIS OF COLON 10/25/2006  . Osteoarthritis 10/25/2006  . CERVICAL SPINE DISORDER, NOS 10/25/2006     Kathrine Cords, OTR/L, MSOT 10/07/2020, 5:05 PM  [facility] [address] [place]  [place]. [place], [place], [address] Phone: [phone]   Fax:  [phone]  Name: Karen Jackson MRN: [id number] Date of Birth: [birth year]

## 2020-10-07 NOTE — Therapy (Signed)
[place] [place], [place], [address] Phone: [phone]   Fax:  [phone] Progress Note Reporting Period 08/19/20 to 10/07/20 for visits 21-30  See note below for Objective Data and Assessment of Progress/Goals.      Physical Therapy Treatment  Patient Details  Name: Karen Jackson MRN: [id number] Date of Birth: [birth year] Referring Provider (PT): Hensel   Encounter Date: 10/07/2020   PT End of Session - 10/07/20 1155    Visit Number 30    Date for PT Re-Evaluation 10/18/20    PT Start Time 1100    PT Stop Time 1142    PT Time Calculation (min) 42 min    Activity Tolerance Patient tolerated treatment well    Behavior During Therapy [facility] for tasks assessed/performed           Past Medical History:  Diagnosis Date  . Acute cystitis without hematuria   . Acute diastolic CHF (congestive heart failure) ([place])   . Arthritis   . Dyspnea   . Dysrhythmia   . Fever of unknown origin 03/19/2017  . Hyperlipidemia   . Hypertension    denies at preop  . Multifocal pneumonia   . Neuromuscular disorder ([place])    neuropathy left arm and foot  . Osteopenia   . Paralysis ([place])    partial left side from CVA   . Persistent atrial fibrillation ([place])   . PONV (postoperative nausea and vomiting)   . Pre-diabetes   . Stroke [facility]) 2013   hemmorahgic    Past Surgical History:  Procedure Laterality Date  . ANKLE SURGERY    . APPENDECTOMY    . CHOLECYSTECTOMY    . HERNIA REPAIR     Esophagus  . JOINT REPLACEMENT     total- right partial- left  . MASTECTOMY PARTIAL / LUMPECTOMY  2012   left  . ORIF ANKLE FRACTURE Left 07/20/2018   Procedure: OPEN REDUCTION INTERNAL FIXATION (ORIF) ANKLE FRACTURE;  Surgeon: Wylene Simmer, MD;  Location: [facility] [place];  Service: Orthopedics;  Laterality: Left;  . TOTAL KNEE ARTHROPLASTY Left 01/27/2019   Procedure: TOTAL KNEE ARTHROPLASTY;  Surgeon: Vickey Huger, MD;  [facility];   Service: Orthopedics;  Laterality: Left;    There were no vitals filed for this visit.   Subjective Assessment - 10/07/20 1117    Subjective Patient reports that she has not had any other issues of the medication, reports still stiff and sore    Currently in Pain? No/denies                             [facility] Adult PT Treatment/Exercise - 10/07/20 0001      Ambulation/Gait   Gait Comments FWW SBA x75', x50' and then 150 feet with CGA, 2 standing rest breaks due to SOB.      Knee/Hip Exercises: Aerobic   Nustep level 6 x 500 steps 8 minutes, 20 seconds      Knee/Hip Exercises: Standing   Other Standing Knee Exercises lifting walker up and over a dowel front wheels and back legs back and forth, then I had her step over and continue on x 2      Knee/Hip Exercises: Seated   Long Arc Quad Both;3 sets;10 reps    Long Arc Quad Weight 5 lbs.    Other Seated Knee/Hip Exercises green tband ankle PF/DF, left ankle on sit fit motions, left hip adduction  Hamstring Curl Both;3 sets;10 reps    Hamstring Limitations green tband                    PT Short Term Goals - 06/18/20 1105      PT SHORT TERM GOAL #1   Title independent with initial HEP    Status Partially Met             PT Long Term Goals - 10/07/20 1200      PT LONG TERM GOAL #1   Title walk with hand hold assist x 100 feet    Status Partially Met      PT LONG TERM GOAL #2   Title increased right LE strength to 4-/5    Status Partially Met      PT LONG TERM GOAL #3   Title walk 100 feet with SBA using the FWW    Status Partially Met      PT LONG TERM GOAL #4   Title transfer independently with set up    Status Partially Met                 Plan - 10/07/20 1156    Clinical Impression Statement At this time patient really struggles more with walking > 110 feet, this is due to SOB, under 100 feet she can do with SBA and appears to be safe.  She has difficulty getting up from  sitting due to weakness this could be exacerbated by her allowing her caregivers to do more for her like lift her from sitting.  I spoke with her and one of the caregivers today.  She struggles with lifting walker and we are doing this as she has difficulty in her foyer where there is stone and she at times has to lift the walker out of a groove.  I feel like this is getting better as she has been seeing the OT recently    PT Next Visit Plan would like to continue to push her limits to get her to a higher independence and functional level    Consulted and Agree with Plan of Care Patient           Patient will benefit from skilled therapeutic intervention in order to improve the following deficits and impairments:  Abnormal gait,Cardiopulmonary status limiting activity,Decreased activity tolerance,Decreased balance,Decreased mobility,Decreased strength,Decreased coordination,Decreased range of motion,Difficulty walking,Increased muscle spasms,Pain  Visit Diagnosis: Hemiplegia and hemiparesis following cerebral infarction affecting left non-dominant side (HCC)  Muscle weakness (generalized)  Ataxia  Other lack of coordination  Abnormal posture  Difficulty in walking, not elsewhere classified  Late effects of CVA (cerebrovascular accident)     Problem List Patient Active Problem List   Diagnosis Date Noted  . Presbycusis of both ears 03/08/2020  . Mixed stress and urge urinary incontinence 12/02/2019  . Macrocytosis 12/01/2019  . Nutritional anemia 12/01/2019  . S/P total knee replacement 01/27/2019  . Recurrent left knee instability 07/05/2018  . Respiratory failure with hypoxia ([place]) 08/30/2017  . Hypoxemia   . Heart failure with preserved ejection fraction ([place]), Grade 3 diastolic dysfunction 93/81/0175  . PAF (paroxysmal atrial fibrillation) ([place])   . Dyspnea 03/19/2017  . Encounter for preventive health examination 02/17/2016  . Sensorineural hearing loss (SNHL),  bilateral 01/26/2016  . Morbid obesity ([place]) 06/17/2015  . Hypomagnesemia 04/24/2014  . Hemiparesis affecting left side as late effect of cerebrovascular accident ([place]) 04/24/2014  . Nontraumatic cerebral hemorrhage ([place]) 04/30/2012  . DM (diabetes mellitus) with  complications ([place]) 20/05/711  . OSTEOPENIA 01/21/2009  . UNSPECIFIED VITAMIN D DEFICIENCY 11/19/2007  . ESSENTIAL HYPERTENSION, BENIGN 11/19/2007  . HYPERCHOLESTEROLEMIA 10/25/2006  . GASTROESOPHAGEAL REFLUX, NO ESOPHAGITIS 10/25/2006  . DIVERTICULOSIS OF COLON 10/25/2006  . Osteoarthritis 10/25/2006  . CERVICAL SPINE DISORDER, NOS 10/25/2006    Sumner Boast 10/07/2020, 12:51 PM  [place]. [place], [place], [address] Phone: [phone]   Fax:  [phone]  Name: Karen Jackson MRN: [id number] Date of Birth: 06-24-1941

## 2020-10-08 ENCOUNTER — Other Ambulatory Visit: Payer: Self-pay | Admitting: Family Medicine

## 2020-10-08 DIAGNOSIS — I4891 Unspecified atrial fibrillation: Secondary | ICD-10-CM

## 2020-10-11 ENCOUNTER — Ambulatory Visit: Payer: Medicare Other | Admitting: Physical Therapy

## 2020-10-11 ENCOUNTER — Other Ambulatory Visit: Payer: Self-pay

## 2020-10-11 ENCOUNTER — Encounter: Payer: Self-pay | Admitting: Physical Therapy

## 2020-10-11 ENCOUNTER — Ambulatory Visit: Payer: Medicare Other | Admitting: Occupational Therapy

## 2020-10-11 DIAGNOSIS — I69354 Hemiplegia and hemiparesis following cerebral infarction affecting left non-dominant side: Secondary | ICD-10-CM

## 2020-10-11 DIAGNOSIS — R270 Ataxia, unspecified: Secondary | ICD-10-CM | POA: Diagnosis not present

## 2020-10-11 DIAGNOSIS — R293 Abnormal posture: Secondary | ICD-10-CM

## 2020-10-11 DIAGNOSIS — R262 Difficulty in walking, not elsewhere classified: Secondary | ICD-10-CM | POA: Diagnosis not present

## 2020-10-11 DIAGNOSIS — R278 Other lack of coordination: Secondary | ICD-10-CM

## 2020-10-11 DIAGNOSIS — I6990 Unspecified sequelae of unspecified cerebrovascular disease: Secondary | ICD-10-CM

## 2020-10-11 DIAGNOSIS — M6281 Muscle weakness (generalized): Secondary | ICD-10-CM | POA: Diagnosis not present

## 2020-10-11 NOTE — Therapy (Signed)
[place]. [place], [place], [address] Phone: [phone]   Fax:  [phone]  Physical Therapy Treatment  Patient Details  Name: Karen Jackson MRN: [id number] Date of Birth: 07-12-1941 Referring Provider (PT): Hensel   Encounter Date: 10/11/2020   PT End of Session - 10/11/20 1140    Visit Number 31    Date for PT Re-Evaluation 10/18/20    PT Start Time 1100    PT Stop Time 1140    PT Time Calculation (min) 40 min    Activity Tolerance Patient tolerated treatment well    Behavior During Therapy [facility] for tasks assessed/performed           Past Medical History:  Diagnosis Date  . Acute cystitis without hematuria   . Acute diastolic CHF (congestive heart failure) ([place])   . Arthritis   . Dyspnea   . Dysrhythmia   . Fever of unknown origin 03/19/2017  . Hyperlipidemia   . Hypertension    denies at preop  . Multifocal pneumonia   . Neuromuscular disorder ([place])    neuropathy left arm and foot  . Osteopenia   . Paralysis ([place])    partial left side from CVA   . Persistent atrial fibrillation ([place])   . PONV (postoperative nausea and vomiting)   . Pre-diabetes   . Stroke [facility]) 2013   hemmorahgic    Past Surgical History:  Procedure Laterality Date  . ANKLE SURGERY    . APPENDECTOMY    . CHOLECYSTECTOMY    . HERNIA REPAIR     Esophagus  . JOINT REPLACEMENT     total- right partial- left  . MASTECTOMY PARTIAL / LUMPECTOMY  2012   left  . ORIF ANKLE FRACTURE Left 07/20/2018   Procedure: OPEN REDUCTION INTERNAL FIXATION (ORIF) ANKLE FRACTURE;  Surgeon: Wylene Simmer, MD;  Location: [place];  Service: Orthopedics;  Laterality: Left;  . TOTAL KNEE ARTHROPLASTY Left 01/27/2019   Procedure: TOTAL KNEE ARTHROPLASTY;  Surgeon: Vickey Huger, MD;  [facility];  Service: Orthopedics;  Laterality: Left;    There were no vitals filed for this visit.   Subjective Assessment - 10/11/20 1101    Subjective  Reports that she had an injection of Ozempic and had some dizziness after that, reports that it is better now.  No falls                             [facility] Adult PT Treatment/Exercise - 10/11/20 0001      Ambulation/Gait   Gait Comments gait with FWW, CGA x 105',      High Level Balance   High Level Balance Comments stanidng on airex using the FWW.  Cues to look up      Knee/Hip Exercises: Seated   Long Arc Quad Both;3 sets;10 reps    Other Seated Knee/Hip Exercises green tband ankle PF/DF, left ankle on sit fit motions, left hip adduction    Marching Both;2 sets;10 reps    Marching Weights 5 lbs.    Hamstring Curl Both;3 sets;10 reps    Hamstring Limitations green tband                    PT Short Term Goals - 06/18/20 1105      PT SHORT TERM GOAL #1   Title independent with initial HEP    Status Partially Met  PT Long Term Goals - 10/07/20 1200      PT LONG TERM GOAL #1   Title walk with hand hold assist x 100 feet    Status Partially Met      PT LONG TERM GOAL #2   Title increased right LE strength to 4-/5    Status Partially Met      PT LONG TERM GOAL #3   Title walk 100 feet with SBA using the FWW    Status Partially Met      PT LONG TERM GOAL #4   Title transfer independently with set up    Status Partially Met                 Plan - 10/11/20 1140    Clinical Impression Statement Patient with some blurred vision and dizziness.  She thinks that it is from the [facility], she will be seeing the MD later this week.  She did well with standing on the firm airex, just needs a lot of cues for posture and to look up, the first time she tended to lean back on the heels, with a few verbal cues she corrected this and was able to do on her onw.    PT Next Visit Plan would like to continue to push her limits to get her to a higher independence and functional level add some balance    Consulted and Agree with Plan of Care  Patient           Patient will benefit from skilled therapeutic intervention in order to improve the following deficits and impairments:  Abnormal gait,Cardiopulmonary status limiting activity,Decreased activity tolerance,Decreased balance,Decreased mobility,Decreased strength,Decreased coordination,Decreased range of motion,Difficulty walking,Increased muscle spasms,Pain  Visit Diagnosis: Hemiplegia and hemiparesis following cerebral infarction affecting left non-dominant side (HCC)  Muscle weakness (generalized)  Ataxia  Other lack of coordination  Abnormal posture  Difficulty in walking, not elsewhere classified  Late effects of CVA (cerebrovascular accident)     Problem List Patient Active Problem List   Diagnosis Date Noted  . Presbycusis of both ears 03/08/2020  . Mixed stress and urge urinary incontinence 12/02/2019  . Macrocytosis 12/01/2019  . Nutritional anemia 12/01/2019  . S/P total knee replacement 01/27/2019  . Recurrent left knee instability 07/05/2018  . Respiratory failure with hypoxia ([place]) 08/30/2017  . Hypoxemia   . Heart failure with preserved ejection fraction ([place]), Grade 3 diastolic dysfunction 79/10/8331  . PAF (paroxysmal atrial fibrillation) ([place])   . Dyspnea 03/19/2017  . Encounter for preventive health examination 02/17/2016  . Sensorineural hearing loss (SNHL), bilateral 01/26/2016  . Morbid obesity ([place]) 06/17/2015  . Hypomagnesemia 04/24/2014  . Hemiparesis affecting left side as late effect of cerebrovascular accident ([place]) 04/24/2014  . Nontraumatic cerebral hemorrhage ([place]) 04/30/2012  . DM (diabetes mellitus) with complications ([place]) 83/29/1916  . OSTEOPENIA 01/21/2009  . UNSPECIFIED VITAMIN D DEFICIENCY 11/19/2007  . ESSENTIAL HYPERTENSION, BENIGN 11/19/2007  . HYPERCHOLESTEROLEMIA 10/25/2006  . GASTROESOPHAGEAL REFLUX, NO ESOPHAGITIS 10/25/2006  . DIVERTICULOSIS OF COLON 10/25/2006  . Osteoarthritis 10/25/2006  . CERVICAL  SPINE DISORDER, NOS 10/25/2006    Sumner Boast., PT 10/11/2020, 11:43 AM  [place]. [place], [place], [address] Phone: [phone]   Fax:  [phone]  Name: MIQUEL LAMSON MRN: [id number] Date of Birth: 1941-08-23

## 2020-10-11 NOTE — Therapy (Signed)
[place]. [place], [place], [address] Phone: [phone]   Fax:  [phone]  Occupational Therapy Treatment  Patient Details  Name: Karen Jackson MRN: [id number] Date of Birth: May 15, 1941 Referring Provider (OT): Madison Hickman   Encounter Date: 10/11/2020   OT End of Session - 10/11/20 1556    Visit Number 12    Number of Visits 19    Date for OT Re-Evaluation 11/05/20    Authorization Type Medicare    Progress Note Due on Visit 56    OT Start Time 1018   Pt arrived late   OT Stop Time 1059    OT Time Calculation (min) 41 min    Activity Tolerance Patient tolerated treatment well    Behavior During Therapy [facility] for tasks assessed/performed           Past Medical History:  Diagnosis Date  . Acute cystitis without hematuria   . Acute diastolic CHF (congestive heart failure) ([place])   . Arthritis   . Dyspnea   . Dysrhythmia   . Fever of unknown origin 03/19/2017  . Hyperlipidemia   . Hypertension    denies at preop  . Multifocal pneumonia   . Neuromuscular disorder ([place])    neuropathy left arm and foot  . Osteopenia   . Paralysis ([place])    partial left side from CVA   . Persistent atrial fibrillation ([place])   . PONV (postoperative nausea and vomiting)   . Pre-diabetes   . Stroke [facility]) 2013   hemmorahgic    Past Surgical History:  Procedure Laterality Date  . ANKLE SURGERY    . APPENDECTOMY    . CHOLECYSTECTOMY    . HERNIA REPAIR     Esophagus  . JOINT REPLACEMENT     total- right partial- left  . MASTECTOMY PARTIAL / LUMPECTOMY  2012   left  . ORIF ANKLE FRACTURE Left 07/20/2018   Procedure: OPEN REDUCTION INTERNAL FIXATION (ORIF) ANKLE FRACTURE;  Surgeon: Wylene Simmer, MD;  Location: [place];  Service: Orthopedics;  Laterality: Left;  . TOTAL KNEE ARTHROPLASTY Left 01/27/2019   Procedure: TOTAL KNEE ARTHROPLASTY;  Surgeon: Vickey Huger, MD;  [facility];  Service: Orthopedics;   Laterality: Left;    There were no vitals filed for this visit.   Subjective Assessment - 10/11/20 1030    Subjective  Pt reports having ozempic shot on Saturday and has been having blurry vision since then; reports it has improved from yesterday to today    Pertinent History CVA with L hemiparesis (2013), OA, HTN, CHF, pre-diabetes    Limitations Pain in LUE, impaired sensation, decreased strength and coordination, decreased PROM/AROM    Patient Stated Goals Improve functional use of LUE, decrease pain    Currently in Pain? Yes    Pain Score 3     Pain Location Shoulder    Pain Orientation Left    Pain Descriptors / Indicators Pins and needles    Pain Type Chronic pain    Pain Onset More than a month ago    Pain Frequency Constant            OT Treatments/Exercises (OP) - 10/11/20 1602      Transfers   Transfers Sit to Stand    Sit to Stand 4: Min assist   w/ RW     ADLs   UB Dressing OT provided demonstration and discussed attempting to increase pt independence with UB dressing due to  increased L shoulder pain when receiving assistance; pt was receptive      Shoulder Exercises: Seated   Extension Strengthening;Right;Left;10 reps   Isometric strengthening of shoulder ext completed 10x2 each side   External Rotation Strengthening;Left;Right;10 reps   Isometric strengthening of shoulder ext rotation completed 10x2 each side   Flexion Strengthening;Left;Right;10 reps   Isometric strengthening of shoulder flex completed 10x2 each side     Neurological Re-education Exercises   Trunk Exercises Core Activation    Trunk Core Activation Modified sit-ups completed sitting in w/c with hands clasped together and elbows at 90 to avoid shoulder pain; completed 5x2 with verbal and tactile cues to engage core w/ trunk flexion   dyspnea on exertion; resolved quickly during rest breaks     Modalities   Modalities Moist Heat   Core and shoulder strengthening exercises completed during moist  heat therapy     Moist Heat Therapy   Number Minutes Moist Heat 15 Minutes    Moist Heat Location Shoulder      Splinting   Splinting OT donned LUE brace for shoulder support at start of session             OT Short Term Goals - 10/07/20 1300      OT SHORT TERM GOAL #1   Title Pt will don/doff zip-up jacket with Min A in at least 1/2 trials.    Baseline Mod A to don/doff zip-up jacket    Time 3    Period Weeks    Status On-going    Target Date 10/29/20      OT SHORT TERM GOAL #2   Title Pt will report pain less than 3/10 in LUE when ambulating with RW at least 75% of the time.    Baseline Pt reports pain in LUE when ambulating with RW in the home.    Time 3    Period Weeks    Status On-going             OT Long Term Goals - 10/05/20 1424      OT LONG TERM GOAL #1   Title Pt will demonstrate/verbalize understanding of compensation and joint protection strategies to decrease pain and increase independence during ADLs.    Baseline Currently Mod A with dressing; participation in ADLs limited by pain    Time 6    Period Weeks    Status On-going    Target Date 11/19/20      OT LONG TERM GOAL #2   Title Pt will improve functional FM coordination of as evidenced by ability to place >50% of pegs into pegboard with LUE.    Baseline Unable to place small pegs with LUE during eval.    Time 6    Period Weeks    Status Achieved   Pt able to place ~75% of pegs into pegboard without dropping (10/05/20)     OT LONG TERM GOAL #3   Title Pt will be independent with HEP for BUE strengthening and GM coordination to improve independence with functional tasks.    Baseline Pt reports no HEP for UE strength/coordination.    Time 6    Period Weeks    Status Partially Met   Pt reports compliance with current HEP (10/05/20)     OT LONG TERM GOAL #4   Title Pt will improve functional grasp of L hand as evidenced by increasing grip strength by >9 lbs.    Baseline LUE = 11 lbs; RUE = 33  lbs    Time 6    Period Weeks    Status On-going            Plan - 10/11/20 1557    Clinical Impression Statement OT demo'd and discussed trialling compensatory/adaptive strategies for UB dressing to help decrease L shoulder pain during activity; pt was receptive. Core and BUE strengthening exercises indicated to support independence and improved body mechanics during functional mobility w/ RW, as well as to increase functional UE use during daily tasks.    OT Occupational Profile and History Problem Focused Assessment - Including review of records relating to presenting problem    Occupational performance deficits (Please refer to evaluation for details): ADL's;IADL's    Body Structure / Function / Physical Skills ADL;UE functional use;Balance;Body mechanics;Pain;FMC;Proprioception;ROM;Coordination;GMC;Sensation;Strength;Dexterity    Rehab Potential Fair    Clinical Decision Making Several treatment options, min-mod task modification necessary    Comorbidities Affecting Occupational Performance: May have comorbidities impacting occupational performance    Modification or Assistance to Complete Evaluation  Min-Moderate modification of tasks or assist with assess necessary to complete eval    OT Frequency 2x / week    OT Duration 6 weeks    OT Treatment/Interventions Self-care/ADL training;Ultrasound;Compression bandaging;DME and/or AE instruction;Patient/family education;Passive range of motion;Electrical Stimulation;Splinting;Moist Heat;Therapeutic exercise;Manual Therapy;Therapeutic activities;Neuromuscular education;Energy conservation;Iontophoresis;Cryotherapy;Functional Mobility Training;Aquatic Therapy    Plan continue with PoC    Consulted and Agree with Plan of Care Patient           Patient will benefit from skilled therapeutic intervention in order to improve the following deficits and impairments:   Body Structure / Function / Physical Skills: ADL,UE functional  use,Balance,Body mechanics,Pain,FMC,Proprioception,ROM,Coordination,GMC,Sensation,Strength,Dexterity       Visit Diagnosis: Hemiplegia and hemiparesis following cerebral infarction affecting left non-dominant side (HCC)  Muscle weakness (generalized)  Ataxia  Other lack of coordination  Abnormal posture    Problem List Patient Active Problem List   Diagnosis Date Noted  . Presbycusis of both ears 03/08/2020  . Mixed stress and urge urinary incontinence 12/02/2019  . Macrocytosis 12/01/2019  . Nutritional anemia 12/01/2019  . S/P total knee replacement 01/27/2019  . Recurrent left knee instability 07/05/2018  . Respiratory failure with hypoxia ([place]) 08/30/2017  . Hypoxemia   . Heart failure with preserved ejection fraction ([place]), Grade 3 diastolic dysfunction 16/05/9603  . PAF (paroxysmal atrial fibrillation) ([place])   . Dyspnea 03/19/2017  . Encounter for preventive health examination 02/17/2016  . Sensorineural hearing loss (SNHL), bilateral 01/26/2016  . Morbid obesity ([place]) 06/17/2015  . Hypomagnesemia 04/24/2014  . Hemiparesis affecting left side as late effect of cerebrovascular accident ([place]) 04/24/2014  . Nontraumatic cerebral hemorrhage ([place]) 04/30/2012  . DM (diabetes mellitus) with complications ([place]) 54/04/8118  . OSTEOPENIA 01/21/2009  . UNSPECIFIED VITAMIN D DEFICIENCY 11/19/2007  . ESSENTIAL HYPERTENSION, BENIGN 11/19/2007  . HYPERCHOLESTEROLEMIA 10/25/2006  . GASTROESOPHAGEAL REFLUX, NO ESOPHAGITIS 10/25/2006  . DIVERTICULOSIS OF COLON 10/25/2006  . Osteoarthritis 10/25/2006  . CERVICAL SPINE DISORDER, NOS 10/25/2006     Kathrine Cords, OTR/L, MSOT 10/11/2020, 4:39 PM  [place]. [place], [place], [address] Phone: [phone]   Fax:  [phone]  Name: Karen Jackson MRN: [id number] Date of Birth: 1941-01-25

## 2020-10-14 ENCOUNTER — Other Ambulatory Visit: Payer: Self-pay

## 2020-10-14 ENCOUNTER — Encounter: Payer: Self-pay | Admitting: Family Medicine

## 2020-10-14 ENCOUNTER — Ambulatory Visit (INDEPENDENT_AMBULATORY_CARE_PROVIDER_SITE_OTHER): Payer: Medicare Other | Admitting: Family Medicine

## 2020-10-14 VITALS — BP 102/58 | HR 83 | Ht 59.0 in | Wt 170.6 lb

## 2020-10-14 DIAGNOSIS — E118 Type 2 diabetes mellitus with unspecified complications: Secondary | ICD-10-CM | POA: Diagnosis not present

## 2020-10-14 DIAGNOSIS — I951 Orthostatic hypotension: Secondary | ICD-10-CM | POA: Diagnosis not present

## 2020-10-14 DIAGNOSIS — I5032 Chronic diastolic (congestive) heart failure: Secondary | ICD-10-CM | POA: Diagnosis not present

## 2020-10-14 DIAGNOSIS — I480 Paroxysmal atrial fibrillation: Secondary | ICD-10-CM

## 2020-10-14 LAB — POCT GLYCOSYLATED HEMOGLOBIN (HGB A1C): Hemoglobin A1C: 6.9 % — AB (ref 4.0–5.6)

## 2020-10-14 NOTE — Patient Instructions (Addendum)
Congrats on your weight loss.   The diabetes is under great control The only thing I am worried about is low blood pressure which make you more likely to fall. Stop the metoprolol.  Dig out your blood pressure machine and keep an eye on it.  I worry about fall risk when the bottom number is less than 60, Try taking the lasix less often.  Maybe every other day.  As you cut back on the lasix, watch for swelling or shortness of breath. Call me in two weeks to tell me how you are feeling.

## 2020-10-15 ENCOUNTER — Encounter: Payer: Self-pay | Admitting: Family Medicine

## 2020-10-15 ENCOUNTER — Encounter: Payer: Self-pay | Admitting: Occupational Therapy

## 2020-10-15 ENCOUNTER — Ambulatory Visit: Payer: Medicare Other | Admitting: Occupational Therapy

## 2020-10-15 ENCOUNTER — Encounter: Payer: Self-pay | Admitting: Physical Therapy

## 2020-10-15 ENCOUNTER — Ambulatory Visit: Payer: Medicare Other | Admitting: Physical Therapy

## 2020-10-15 VITALS — BP 131/75 | HR 84

## 2020-10-15 DIAGNOSIS — I69354 Hemiplegia and hemiparesis following cerebral infarction affecting left non-dominant side: Secondary | ICD-10-CM | POA: Diagnosis not present

## 2020-10-15 DIAGNOSIS — R262 Difficulty in walking, not elsewhere classified: Secondary | ICD-10-CM

## 2020-10-15 DIAGNOSIS — R278 Other lack of coordination: Secondary | ICD-10-CM | POA: Diagnosis not present

## 2020-10-15 DIAGNOSIS — R270 Ataxia, unspecified: Secondary | ICD-10-CM

## 2020-10-15 DIAGNOSIS — R293 Abnormal posture: Secondary | ICD-10-CM

## 2020-10-15 DIAGNOSIS — I951 Orthostatic hypotension: Secondary | ICD-10-CM | POA: Insufficient documentation

## 2020-10-15 DIAGNOSIS — M6281 Muscle weakness (generalized): Secondary | ICD-10-CM | POA: Diagnosis not present

## 2020-10-15 DIAGNOSIS — M25512 Pain in left shoulder: Secondary | ICD-10-CM

## 2020-10-15 DIAGNOSIS — I6990 Unspecified sequelae of unspecified cerebrovascular disease: Secondary | ICD-10-CM

## 2020-10-15 MED ORDER — OZEMPIC (0.25 OR 0.5 MG/DOSE) 2 MG/1.5ML ~~LOC~~ SOPN: 0.2500 mg | PEN_INJECTOR | SUBCUTANEOUS | 3 refills | Status: DC

## 2020-10-15 NOTE — Therapy (Signed)
[place]. [place], [place], [address] Phone: [phone]   Fax:  [phone]  Occupational Therapy Treatment  Patient Details  Name: Karen Jackson MRN: [id number] Date of Birth: 09-Jan-1941 Referring Provider (OT): Madison Hickman   Encounter Date: 10/15/2020   OT End of Session - 10/15/20 1234    Visit Number 13    Number of Visits 19    Date for OT Re-Evaluation 11/05/20    Authorization Type Medicare    Progress Note Due on Visit 73    OT Start Time 1015    OT Stop Time 1058    OT Time Calculation (min) 43 min    Activity Tolerance Patient tolerated treatment well    Behavior During Therapy [facility] for tasks assessed/performed           Past Medical History:  Diagnosis Date  . Acute cystitis without hematuria   . Acute diastolic CHF (congestive heart failure) ([place])   . Arthritis   . Dyspnea   . Dysrhythmia   . Fever of unknown origin 03/19/2017  . Hyperlipidemia   . Hypertension    denies at preop  . Multifocal pneumonia   . Neuromuscular disorder ([place])    neuropathy left arm and foot  . Osteopenia   . Paralysis ([place])    partial left side from CVA   . Persistent atrial fibrillation ([place])   . PONV (postoperative nausea and vomiting)   . Pre-diabetes   . Stroke [facility]) 2013   hemmorahgic    Past Surgical History:  Procedure Laterality Date  . ANKLE SURGERY    . APPENDECTOMY    . CHOLECYSTECTOMY    . HERNIA REPAIR     Esophagus  . JOINT REPLACEMENT     total- right partial- left  . MASTECTOMY PARTIAL / LUMPECTOMY  2012   left  . ORIF ANKLE FRACTURE Left 07/20/2018   Procedure: OPEN REDUCTION INTERNAL FIXATION (ORIF) ANKLE FRACTURE;  Surgeon: Wylene Simmer, MD;  Location: [place];  Service: Orthopedics;  Laterality: Left;  . TOTAL KNEE ARTHROPLASTY Left 01/27/2019   Procedure: TOTAL KNEE ARTHROPLASTY;  Surgeon: Vickey Huger, MD;  [facility];  Service: Orthopedics;  Laterality: Left;     Vitals:   10/15/20 1025  BP: 131/75  Pulse: 84     Subjective Assessment - 10/15/20 1230    Subjective  Pt reports blurry vision has continued to be noticeable throughout the week and she had a follow-up with her PCP yesterday who noted that her BP was low, which is likely related; he altered some of her medications    Pertinent History CVA with L hemiparesis (2013), OA, HTN, CHF, pre-diabetes    Limitations Pain in LUE, impaired sensation, decreased strength and coordination, decreased PROM/AROM    Patient Stated Goals Improve functional use of LUE, decrease pain    Currently in Pain? No/denies    Pain Onset --            OT Treatments/Exercises (OP) - 10/15/20 1222      Transfers   Transfers Sit to Stand    Sit to Stand 4: Min assist   Transfer completed to fit/adjust platform attachment onto walker for LUE support durring ambulation     Hand Exercises   Other Hand Exercises [place] played with pt to improve [place] of L hand; pt instructed to hold cards in R hand, retrieve individual cards off deck and out of hand w/ pinch prehension of  L hand and place them on the table while opening hand fully to release each card      Modalities   Modalities Moist Heat   Playing card activities completed during moist heat therapy     Moist Heat Therapy   Number Minutes Moist Heat 15 Minutes    Moist Heat Location Shoulder      Splinting   Splinting OT donned LUE brace for shoulder support at start of session      Fine Motor Coordination (Hand/Wrist)   Flipping cards Pt instructed to flip cards with L hand, focusing on opening hand fully in between each card to facilitate functional grasp and release    Dealing card with thumb Using L thumb to slide card back and forth on top of deck supported in L hand 10x   Pt unable to deal card with L thumb and activity was graded down to facilitate correct movement pattern w/ L thumb            OT Short Term Goals - 10/07/20 1300      OT  SHORT TERM GOAL #1   Title Pt will don/doff zip-up jacket with Min A in at least 1/2 trials.    Baseline Mod A to don/doff zip-up jacket    Time 3    Period Weeks    Status On-going    Target Date 10/29/20      OT SHORT TERM GOAL #2   Title Pt will report pain less than 3/10 in LUE when ambulating with RW at least 75% of the time.    Baseline Pt reports pain in LUE when ambulating with RW in the home.    Time 3    Period Weeks    Status On-going             OT Long Term Goals - 10/05/20 1424      OT LONG TERM GOAL #1   Title Pt will demonstrate/verbalize understanding of compensation and joint protection strategies to decrease pain and increase independence during ADLs.    Baseline Currently Mod A with dressing; participation in ADLs limited by pain    Time 6    Period Weeks    Status On-going    Target Date 11/19/20      OT LONG TERM GOAL #2   Title Pt will improve functional FM coordination of as evidenced by ability to place >50% of pegs into pegboard with LUE.    Baseline Unable to place small pegs with LUE during eval.    Time 6    Period Weeks    Status Achieved   Pt able to place ~75% of pegs into pegboard without dropping (10/05/20)     OT LONG TERM GOAL #3   Title Pt will be independent with HEP for BUE strengthening and GM coordination to improve independence with functional tasks.    Baseline Pt reports no HEP for UE strength/coordination.    Time 6    Period Weeks    Status Partially Met   Pt reports compliance with current HEP (10/05/20)     OT LONG TERM GOAL #4   Title Pt will improve functional grasp of L hand as evidenced by increasing grip strength by >9 lbs.    Baseline LUE = 11 lbs; RUE = 33 lbs    Time 6    Period Weeks    Status On-going            Plan - 10/15/20 1234  Clinical Impression Statement Pt demo'd improved [place] and grasp/release with LUE this session during playing card activities; in-hand manipulation continues to be difficult     OT Occupational Profile and History Problem Focused Assessment - Including review of records relating to presenting problem    Occupational performance deficits (Please refer to evaluation for details): ADL's;IADL's    Body Structure / Function / Physical Skills ADL;UE functional use;Balance;Body mechanics;Pain;FMC;Proprioception;ROM;Coordination;GMC;Sensation;Strength;Dexterity    Rehab Potential Fair    Clinical Decision Making Several treatment options, min-mod task modification necessary    Comorbidities Affecting Occupational Performance: May have comorbidities impacting occupational performance    Modification or Assistance to Complete Evaluation  Min-Moderate modification of tasks or assist with assess necessary to complete eval    OT Frequency 2x / week    OT Duration 6 weeks    OT Treatment/Interventions Self-care/ADL training;Ultrasound;Compression bandaging;DME and/or AE instruction;Patient/family education;Passive range of motion;Electrical Stimulation;Splinting;Moist Heat;Therapeutic exercise;Manual Therapy;Therapeutic activities;Neuromuscular education;Energy conservation;Iontophoresis;Cryotherapy;Functional Mobility Training;Aquatic Therapy    Plan continue with PoC    Consulted and Agree with Plan of Care Patient           Patient will benefit from skilled therapeutic intervention in order to improve the following deficits and impairments:   Body Structure / Function / Physical Skills: ADL,UE functional use,Balance,Body mechanics,Pain,FMC,Proprioception,ROM,Coordination,GMC,Sensation,Strength,Dexterity       Visit Diagnosis: Hemiplegia and hemiparesis following cerebral infarction affecting left non-dominant side (HCC)  Muscle weakness (generalized)  Ataxia  Other lack of coordination  Abnormal posture    Problem List Patient Active Problem List   Diagnosis Date Noted  . Orthostatic hypotension 10/15/2020  . Presbycusis of both ears 03/08/2020  . Mixed stress  and urge urinary incontinence 12/02/2019  . Macrocytosis 12/01/2019  . Nutritional anemia 12/01/2019  . S/P total knee replacement 01/27/2019  . Recurrent left knee instability 07/05/2018  . Respiratory failure with hypoxia ([place]) 08/30/2017  . Hypoxemia   . Heart failure with preserved ejection fraction ([place]), Grade 3 diastolic dysfunction 73/41/9379  . PAF (paroxysmal atrial fibrillation) ([place])   . Dyspnea 03/19/2017  . Encounter for preventive health examination 02/17/2016  . Sensorineural hearing loss (SNHL), bilateral 01/26/2016  . Morbid obesity ([place]) 06/17/2015  . Hypomagnesemia 04/24/2014  . Hemiparesis affecting left side as late effect of cerebrovascular accident ([place]) 04/24/2014  . Nontraumatic cerebral hemorrhage ([place]) 04/30/2012  . DM (diabetes mellitus) with complications ([place]) 02/40/9735  . OSTEOPENIA 01/21/2009  . UNSPECIFIED VITAMIN D DEFICIENCY 11/19/2007  . HYPERCHOLESTEROLEMIA 10/25/2006  . GASTROESOPHAGEAL REFLUX, NO ESOPHAGITIS 10/25/2006  . DIVERTICULOSIS OF COLON 10/25/2006  . Osteoarthritis 10/25/2006  . CERVICAL SPINE DISORDER, NOS 10/25/2006     Kathrine Cords, OTR/L, MSOT 10/15/2020, 12:37 PM  [place]. [place], [place], [address] Phone: [phone]   Fax:  [phone]  Name: Karen Jackson MRN: [id number] Date of Birth: Dec 15, 1940

## 2020-10-15 NOTE — Progress Notes (Signed)
    SUBJECTIVE:   CHIEF COMPLAINT / HPI:   Lightheadedness.  Karen Jackson has been doing well in PT.  She was able to walk 250 feet 3 days ago.   For the last week, she has been having lightheaded spells.  No room spinning.  Vision just blurs and she feels like she must sit down.  See discussion of CHF, DM and a fib below for medication induced considerations.  No falls  DM.  Doing very well on low dose of ozempic (0.25 mg weekly.)  A1C at goal.  She has lost weight since starting.  A fib.  On metoprolol for rate control.  She does not feel the a fib so she does not know when she is in it or in sinus.  Not on blood thinner due to previous CNS hemorrhage.  CHF.  Taking lasix daily and will take an extra dose any time she feels congested.  She denies leg swelling.  She has taken lasix 2x daily a couple times this week due to chest congestion    OBJECTIVE:   BP (!) 102/58   Pulse 83   Ht 4\' 11"  (1.499 m)   Wt 170 lb 9.6 oz (77.4 kg)   SpO2 92%   BMI 34.46 kg/m   Low BP noted. Cardiac RRR without m or g Lungs clear Legs trace edema.  ASSESSMENT/PLAN:   DM (diabetes mellitus) with complications ([place]) Well controled and nicely losing weight on Ozempic.  PAF (paroxysmal atrial fibrillation) (HCC) Seems to be in sinus.  No recent palpitations or documented tachycardia.  Long discussion.  Given low BP, risk benefit ration favors stopping the metoprolol.  Heart failure with preserved ejection fraction (HCC), Grade 3 diastolic dysfunction Over diuresis may be playing a role in hypotension.  Especially since she has decreased PO intake with her ozempic.  Decrease lasix dosing.  She will try every other day.  Orthostatic hypotension As above.  Stop metoprolol.  DEcrease lasix.  Monitor home BP.  Fall precautions.     Karen Resides, MD [place]

## 2020-10-15 NOTE — Assessment & Plan Note (Signed)
Seems to be in sinus.  No recent palpitations or documented tachycardia.  Long discussion.  Given low BP, risk benefit ration favors stopping the metoprolol.

## 2020-10-15 NOTE — Assessment & Plan Note (Signed)
Well controled and nicely losing weight on Ozempic.

## 2020-10-15 NOTE — Assessment & Plan Note (Signed)
Over diuresis may be playing a role in hypotension.  Especially since she has decreased PO intake with her ozempic.  Decrease lasix dosing.  She will try every other day.

## 2020-10-15 NOTE — Assessment & Plan Note (Signed)
As above.  Stop metoprolol.  DEcrease lasix.  Monitor home BP.  Fall precautions.

## 2020-10-15 NOTE — Therapy (Signed)
[place]. [place], [place], [address] Phone: [phone]   Fax:  [phone]  Physical Therapy Treatment  Patient Details  Name: Karen Jackson MRN: [id number] Date of Birth: 1941-03-01 Referring Provider (PT): [place]   Encounter Date: 10/15/2020   PT End of Session - 10/15/20 1144    Visit Number 32    Date for PT Re-Evaluation 10/18/20    PT Start Time 1100    PT Stop Time 1141    PT Time Calculation (min) 41 min    Activity Tolerance Patient tolerated treatment well    Behavior During Therapy [facility] for tasks assessed/performed           Past Medical History:  Diagnosis Date  . Acute cystitis without hematuria   . Acute diastolic CHF (congestive heart failure) ([place])   . Arthritis   . Dyspnea   . Dysrhythmia   . Fever of unknown origin 03/19/2017  . Hyperlipidemia   . Hypertension    denies at preop  . Multifocal pneumonia   . Neuromuscular disorder ([place])    neuropathy left arm and foot  . Osteopenia   . Paralysis ([place])    partial left side from CVA   . Persistent atrial fibrillation ([place])   . PONV (postoperative nausea and vomiting)   . Pre-diabetes   . Stroke [facility]) 2013   hemmorahgic    Past Surgical History:  Procedure Laterality Date  . ANKLE SURGERY    . APPENDECTOMY    . CHOLECYSTECTOMY    . HERNIA REPAIR     Esophagus  . JOINT REPLACEMENT     total- right partial- left  . MASTECTOMY PARTIAL / LUMPECTOMY  2012   left  . ORIF ANKLE FRACTURE Left 07/20/2018   Procedure: OPEN REDUCTION INTERNAL FIXATION (ORIF) ANKLE FRACTURE;  Surgeon: Wylene Simmer, MD;  Location: [place];  Service: Orthopedics;  Laterality: Left;  . TOTAL KNEE ARTHROPLASTY Left 01/27/2019   Procedure: TOTAL KNEE ARTHROPLASTY;  Surgeon: Vickey Huger, MD;  [facility];  Service: Orthopedics;  Laterality: Left;    There were no vitals filed for this visit.   Subjective Assessment - 10/15/20 1048    Subjective  Patietn reports that she saw her MD and he changed her medications due to low BP                             [facility] Adult PT Treatment/Exercise - 10/15/20 0001      Ambulation/Gait   Gait Comments The OT worked on fitting the platform on her walker in the session prior to our treat, I worked with her on using the platform walker and how to be safe, it seems to give some good support of the left UE and the shoulder, I walked with her using this 2x50' and then 1x 110 feet.  I changed the platform some with bringing the handle back so she could grip it as well as moving the whole apparatus back to line up with the arm better, I would want to change the height next to give a little more support of the shoulder.      Knee/Hip Exercises: Aerobic   Nustep level 6 x 500 steps 8 minutes, 20 seconds      Knee/Hip Exercises: Seated   Long Arc Quad Both;3 sets;10 reps    Long Arc Quad Weight 3 lbs.    Other Seated Knee/Hip Exercises  green tband ankle PF/DF, left ankle on sit fit motions, left hip adduction    Hamstring Curl Both;3 sets;10 reps    Hamstring Limitations green tband                    PT Short Term Goals - 06/18/20 1105      PT SHORT TERM GOAL #1   Title independent with initial HEP    Status Partially Met             PT Long Term Goals - 10/15/20 1147      PT LONG TERM GOAL #1   Title walk with hand hold assist x 100 feet    Status Partially Met                 Plan - 10/15/20 1145    Clinical Impression Statement Patient is a little more short of breath today, she fatigued easily, she has changed her BP medication.  The OT placed a platform on her walker to help with LUE support.  I adjusted after the first walk and then again after the second trying to get better support and good hand position.  She felt like after the last adjustment that it helped and she had less arm pain and less numbness.  However, today she was just much more SOB  with walking.  I feel that we need to monitor this as she changed her BP meds and is taking less lasix    PT Next Visit Plan write renewal next visit           Patient will benefit from skilled therapeutic intervention in order to improve the following deficits and impairments:  Abnormal gait,Cardiopulmonary status limiting activity,Decreased activity tolerance,Decreased balance,Decreased mobility,Decreased strength,Decreased coordination,Decreased range of motion,Difficulty walking,Increased muscle spasms,Pain  Visit Diagnosis: Hemiplegia and hemiparesis following cerebral infarction affecting left non-dominant side (HCC)  Muscle weakness (generalized)  Ataxia  Abnormal posture  Difficulty in walking, not elsewhere classified  Late effects of CVA (cerebrovascular accident)  Ataxia, unspecified  Left shoulder pain, unspecified chronicity     Problem List Patient Active Problem List   Diagnosis Date Noted  . Orthostatic hypotension 10/15/2020  . Presbycusis of both ears 03/08/2020  . Mixed stress and urge urinary incontinence 12/02/2019  . Macrocytosis 12/01/2019  . Nutritional anemia 12/01/2019  . S/P total knee replacement 01/27/2019  . Recurrent left knee instability 07/05/2018  . Respiratory failure with hypoxia ([place]) 08/30/2017  . Hypoxemia   . Heart failure with preserved ejection fraction ([place]), Grade 3 diastolic dysfunction 32/95/1884  . PAF (paroxysmal atrial fibrillation) ([place])   . Dyspnea 03/19/2017  . Encounter for preventive health examination 02/17/2016  . Sensorineural hearing loss (SNHL), bilateral 01/26/2016  . Morbid obesity ([place]) 06/17/2015  . Hypomagnesemia 04/24/2014  . Hemiparesis affecting left side as late effect of cerebrovascular accident ([place]) 04/24/2014  . Nontraumatic cerebral hemorrhage ([place]) 04/30/2012  . DM (diabetes mellitus) with complications ([place]) 16/60/6301  . OSTEOPENIA 01/21/2009  . UNSPECIFIED VITAMIN D DEFICIENCY 11/19/2007   . HYPERCHOLESTEROLEMIA 10/25/2006  . GASTROESOPHAGEAL REFLUX, NO ESOPHAGITIS 10/25/2006  . DIVERTICULOSIS OF COLON 10/25/2006  . Osteoarthritis 10/25/2006  . CERVICAL SPINE DISORDER, NOS 10/25/2006    Sumner Boast., PT 10/15/2020, 11:49 AM  [place]. [place], [place], [address] Phone: [phone]   Fax:  [phone]  Name: Karen Jackson MRN: [id number] Date of Birth: Jan 20, 1941

## 2020-10-18 ENCOUNTER — Ambulatory Visit: Payer: Medicare Other | Admitting: Physical Therapy

## 2020-10-18 ENCOUNTER — Ambulatory Visit: Payer: Medicare Other | Admitting: Occupational Therapy

## 2020-10-18 ENCOUNTER — Encounter: Payer: Self-pay | Admitting: Occupational Therapy

## 2020-10-18 ENCOUNTER — Other Ambulatory Visit: Payer: Self-pay

## 2020-10-18 ENCOUNTER — Encounter: Payer: Self-pay | Admitting: Physical Therapy

## 2020-10-18 DIAGNOSIS — M6281 Muscle weakness (generalized): Secondary | ICD-10-CM | POA: Diagnosis not present

## 2020-10-18 DIAGNOSIS — I69354 Hemiplegia and hemiparesis following cerebral infarction affecting left non-dominant side: Secondary | ICD-10-CM | POA: Diagnosis not present

## 2020-10-18 DIAGNOSIS — I6990 Unspecified sequelae of unspecified cerebrovascular disease: Secondary | ICD-10-CM

## 2020-10-18 DIAGNOSIS — R270 Ataxia, unspecified: Secondary | ICD-10-CM | POA: Diagnosis not present

## 2020-10-18 DIAGNOSIS — R293 Abnormal posture: Secondary | ICD-10-CM | POA: Diagnosis not present

## 2020-10-18 DIAGNOSIS — R278 Other lack of coordination: Secondary | ICD-10-CM | POA: Diagnosis not present

## 2020-10-18 DIAGNOSIS — R262 Difficulty in walking, not elsewhere classified: Secondary | ICD-10-CM | POA: Diagnosis not present

## 2020-10-18 NOTE — Therapy (Signed)
[place]. [place], [place], [address] Phone: [phone]   Fax:  [phone]  Occupational Therapy Treatment  Patient Details  Name: Karen Jackson MRN: [id number] Date of Birth: 1941-03-17 Referring Provider (OT): Madison Hickman   Encounter Date: 10/18/2020   OT End of Session - 10/18/20 1219    Visit Number 14    Number of Visits 19    Date for OT Re-Evaluation 11/05/20    Authorization Type Medicare    Progress Note Due on Visit 20    OT Start Time 1015    OT Stop Time 1056    OT Time Calculation (min) 41 min    Activity Tolerance Patient tolerated treatment well    Behavior During Therapy [facility] for tasks assessed/performed           Past Medical History:  Diagnosis Date  . Acute cystitis without hematuria   . Acute diastolic CHF (congestive heart failure) ([place])   . Arthritis   . Dyspnea   . Dysrhythmia   . Fever of unknown origin 03/19/2017  . Hyperlipidemia   . Hypertension    denies at preop  . Multifocal pneumonia   . Neuromuscular disorder ([place])    neuropathy left arm and foot  . Osteopenia   . Paralysis ([place])    partial left side from CVA   . Persistent atrial fibrillation ([place])   . PONV (postoperative nausea and vomiting)   . Pre-diabetes   . Stroke [facility]) 2013   hemmorahgic    Past Surgical History:  Procedure Laterality Date  . ANKLE SURGERY    . APPENDECTOMY    . CHOLECYSTECTOMY    . HERNIA REPAIR     Esophagus  . JOINT REPLACEMENT     total- right partial- left  . MASTECTOMY PARTIAL / LUMPECTOMY  2012   left  . ORIF ANKLE FRACTURE Left 07/20/2018   Procedure: OPEN REDUCTION INTERNAL FIXATION (ORIF) ANKLE FRACTURE;  Surgeon: Wylene Simmer, MD;  Location: [facility];  Service: Orthopedics;  Laterality: Left;  . TOTAL KNEE ARTHROPLASTY Left 01/27/2019   Procedure: TOTAL KNEE ARTHROPLASTY;  Surgeon: Vickey Huger, MD;  [facility];  Service: Orthopedics;  Laterality: Left;     There were no vitals filed for this visit.   Subjective Assessment - 10/18/20 1026    Subjective  Pt reports her blurry vision and the dizziness have improved over the weekend and that she thinks the compensatory strategies practiced for UB dressing will be helpful at home    Pertinent History CVA with L hemiparesis (2013), OA, HTN, CHF, pre-diabetes    Limitations Pain in LUE, impaired sensation, decreased strength and coordination, decreased PROM/AROM    Patient Stated Goals Improve functional use of LUE, decrease pain    Currently in Pain? Yes    Pain Score 3     Pain Location Shoulder    Pain Orientation Left    Pain Descriptors / Indicators Aching    Pain Type Chronic pain    Pain Radiating Towards Pins and needles from her elbow to her hand            OT Treatments/Exercises (OP) - 10/18/20 1231      ADLs   UB Dressing Pt Min A to doff zip-up jacket due to difficulty pulling out R arm first; Min A donning overhead shirt   Pt preferred pulling shirt overhead first, before threading L arm and then R arm due to decreased L  shoulder pain with this method     Modalities   Modalities Moist Heat   In-hand manipulation activity completed during moist heat therapy     Moist Heat Therapy   Number Minutes Moist Heat 10 Minutes    Moist Heat Location Shoulder      Splinting   Splinting OT donned LUE brace for shoulder support at start of session and prior to transition to PT session      Fine Motor Coordination (Hand/Wrist)   Fine Motor Coordination In hand manipuation training    In Hand Manipulation Training Pt threaded small beads onto string, holding bead in L hand and string w/ R hand; able to thread 10x with 1 drop with extra time and v/c            OT Education - 10/18/20 1214    Education Details OT demo'd and practiced compensatory strategies for UB dressing with pt    Person(s) Educated Patient    Methods Explanation;Demonstration    Comprehension Verbalized  understanding;Returned demonstration            OT Short Term Goals - 10/07/20 1300      OT SHORT TERM GOAL #1   Title Pt will don/doff zip-up jacket with Min A in at least 1/2 trials.    Baseline Mod A to don/doff zip-up jacket    Time 3    Period Weeks    Status On-going    Target Date 10/29/20      OT SHORT TERM GOAL #2   Title Pt will report pain less than 3/10 in LUE when ambulating with RW at least 75% of the time.    Baseline Pt reports pain in LUE when ambulating with RW in the home.    Time 3    Period Weeks    Status On-going            OT Long Term Goals - 10/18/20 1037      OT LONG TERM GOAL #1   Title Pt will demonstrate/verbalize understanding of compensation and joint protection strategies to decrease pain and increase independence during ADLs.    Baseline Currently Mod A with dressing; participation in ADLs limited by pain    Time 6    Period Weeks    Status On-going      OT LONG TERM GOAL #2   Title Pt will improve functional FM coordination of as evidenced by ability to place >50% of pegs into pegboard with LUE.    Baseline Unable to place small pegs with LUE during eval.    Time 6    Period Weeks    Status Achieved   Pt able to place ~75% of pegs into pegboard without dropping (10/05/20)     OT LONG TERM GOAL #3   Title Pt will be independent with HEP for BUE strengthening and GM coordination to improve independence with functional tasks.    Baseline Pt reports no HEP for UE strength/coordination.    Time 6    Period Weeks    Status Partially Met   Pt reports compliance with current HEP (10/05/20)     OT LONG TERM GOAL #4   Title Pt will improve functional grasp of L hand as evidenced by increasing grip strength by >9 lbs.    Baseline LUE = 11 lbs; RUE = 33 lbs    Time 6    Period Weeks    Status Achieved   LUE = 21 lbs; RUE =  36 lbs           Plan - 10/18/20 1219    Clinical Impression Statement Pt continues to demonstrate improvements  with LUE strength and coordination. This session she showed improvement with both in-hand manipulation and L hand grip strength. During UB dressing, pt experienced difficulty pulling her shirt overhead with arms threaded, but was able to complete activity w/out pain donning shirt overhead before threading arms.    OT Occupational Profile and History Problem Focused Assessment - Including review of records relating to presenting problem    Occupational performance deficits (Please refer to evaluation for details): ADL's;IADL's    Body Structure / Function / Physical Skills ADL;UE functional use;Balance;Body mechanics;Pain;FMC;Proprioception;ROM;Coordination;GMC;Sensation;Strength;Dexterity    Rehab Potential Fair    Clinical Decision Making Several treatment options, min-mod task modification necessary    Comorbidities Affecting Occupational Performance: May have comorbidities impacting occupational performance    Modification or Assistance to Complete Evaluation  Min-Moderate modification of tasks or assist with assess necessary to complete eval    OT Frequency 2x / week    OT Duration 6 weeks    OT Treatment/Interventions Self-care/ADL training;Ultrasound;Compression bandaging;DME and/or AE instruction;Patient/family education;Passive range of motion;Electrical Stimulation;Splinting;Moist Heat;Therapeutic exercise;Manual Therapy;Therapeutic activities;Neuromuscular education;Energy conservation;Iontophoresis;Cryotherapy;Functional Mobility Training;Aquatic Therapy    Plan continue with PoC    Consulted and Agree with Plan of Care Patient           Patient will benefit from skilled therapeutic intervention in order to improve the following deficits and impairments:   Body Structure / Function / Physical Skills: ADL,UE functional use,Balance,Body mechanics,Pain,FMC,Proprioception,ROM,Coordination,GMC,Sensation,Strength,Dexterity       Visit Diagnosis: Hemiplegia and hemiparesis following  cerebral infarction affecting left non-dominant side (HCC)  Other lack of coordination  Muscle weakness (generalized)  Ataxia  Abnormal posture    Problem List Patient Active Problem List   Diagnosis Date Noted  . Orthostatic hypotension 10/15/2020  . Presbycusis of both ears 03/08/2020  . Mixed stress and urge urinary incontinence 12/02/2019  . Macrocytosis 12/01/2019  . Nutritional anemia 12/01/2019  . S/P total knee replacement 01/27/2019  . Recurrent left knee instability 07/05/2018  . Respiratory failure with hypoxia ([place]) 08/30/2017  . Hypoxemia   . Heart failure with preserved ejection fraction ([place]), Grade 3 diastolic dysfunction 62/69/4854  . PAF (paroxysmal atrial fibrillation) ([place])   . Dyspnea 03/19/2017  . Encounter for preventive health examination 02/17/2016  . Sensorineural hearing loss (SNHL), bilateral 01/26/2016  . Morbid obesity ([place]) 06/17/2015  . Hypomagnesemia 04/24/2014  . Hemiparesis affecting left side as late effect of cerebrovascular accident ([place]) 04/24/2014  . Nontraumatic cerebral hemorrhage ([place]) 04/30/2012  . DM (diabetes mellitus) with complications ([place]) 62/70/3500  . OSTEOPENIA 01/21/2009  . UNSPECIFIED VITAMIN D DEFICIENCY 11/19/2007  . HYPERCHOLESTEROLEMIA 10/25/2006  . GASTROESOPHAGEAL REFLUX, NO ESOPHAGITIS 10/25/2006  . DIVERTICULOSIS OF COLON 10/25/2006  . Osteoarthritis 10/25/2006  . CERVICAL SPINE DISORDER, NOS 10/25/2006     Kathrine Cords, OTR/L, MSOT 10/18/2020, 12:39 PM  [place]. [place], [place], [address] Phone: [phone]   Fax:  [phone]  Name: TANDY GRAWE MRN: [id number] Date of Birth: 1941-02-10

## 2020-10-18 NOTE — Therapy (Signed)
[place]. [place], [place], [address] Phone: [phone]   Fax:  [phone]  Physical Therapy Treatment  Patient Details  Name: Karen Jackson MRN: [id number] Date of Birth: 08-Jun-1941 Referring Provider (PT): Hensel   Encounter Date: 10/18/2020   PT End of Session - 10/18/20 1143    Visit Number 33    Date for PT Re-Evaluation 11/16/20    PT Start Time 1100    PT Stop Time 1143    PT Time Calculation (min) 43 min    Activity Tolerance Patient tolerated treatment well    Behavior During Therapy [facility] for tasks assessed/performed           Past Medical History:  Diagnosis Date  . Acute cystitis without hematuria   . Acute diastolic CHF (congestive heart failure) ([place])   . Arthritis   . Dyspnea   . Dysrhythmia   . Fever of unknown origin 03/19/2017  . Hyperlipidemia   . Hypertension    denies at preop  . Multifocal pneumonia   . Neuromuscular disorder ([place])    neuropathy left arm and foot  . Osteopenia   . Paralysis ([place])    partial left side from CVA   . Persistent atrial fibrillation ([place])   . PONV (postoperative nausea and vomiting)   . Pre-diabetes   . Stroke [facility]) 2013   hemmorahgic    Past Surgical History:  Procedure Laterality Date  . ANKLE SURGERY    . APPENDECTOMY    . CHOLECYSTECTOMY    . HERNIA REPAIR     Esophagus  . JOINT REPLACEMENT     total- right partial- left  . MASTECTOMY PARTIAL / LUMPECTOMY  2012   left  . ORIF ANKLE FRACTURE Left 07/20/2018   Procedure: OPEN REDUCTION INTERNAL FIXATION (ORIF) ANKLE FRACTURE;  Surgeon: Wylene Simmer, MD;  Location: [facility];  Service: Orthopedics;  Laterality: Left;  . TOTAL KNEE ARTHROPLASTY Left 01/27/2019   Procedure: TOTAL KNEE ARTHROPLASTY;  Surgeon: Vickey Huger, MD;  [facility];  Service: Orthopedics;  Laterality: Left;    There were no vitals filed for this visit.   Subjective Assessment - 10/18/20 1104    Subjective  Patient reports that she is not having as much pain and as much difficulty as last week.  Still some difficulty with the medication but will be another week before they reassess the medication    Currently in Pain? No/denies                             [facility] Adult PT Treatment/Exercise - 10/18/20 0001      Ambulation/Gait   Gait Comments Tried the platform again, patient reports that she feels like she will not use it and that she felt like it was "too cumbersome".  She had difficulty walking the 125' she was very SOB and fatigued with this. She had some wheezing, she reports that she is not taking the lasix but one every other day, she will try this out per MD and recheck next week.  We did two other walks about 60 feet with less difficulty      Knee/Hip Exercises: Standing   Abduction Limitations tried hip abduction with 2# there was a lot of shoulder popping that was painful so we stopped this    Other Standing Knee Exercises practiced holding proper posture 2x 2 minues, requires verbal cues, tried tactile cues but  she was unable to tolerate any pushing to correct    Other Standing Knee Exercises had her step up onto a 4" step and then down, with CGA and a lot of verbal cues      Knee/Hip Exercises: Seated   Other Seated Knee/Hip Exercises green tband ankle PF/DF, left ankle on sit fit motions, left hip adduction    Hamstring Curl Both;3 sets;10 reps    Hamstring Limitations green tband                    PT Short Term Goals - 06/18/20 1105      PT SHORT TERM GOAL #1   Title independent with initial HEP    Status Partially Met             PT Long Term Goals - 10/18/20 1146      PT LONG TERM GOAL #1   Title walk with hand hold assist x 100 feet    Status Partially Met      PT LONG TERM GOAL #2   Title increased right LE strength to 4-/5    Status Partially Met      PT LONG TERM GOAL #3   Title walk 100 feet with SBA using the FWW    Status  Partially Met      PT LONG TERM GOAL #4   Title transfer independently with set up    Status Partially Met      PT LONG TERM GOAL #5   Title decrease TUG to 39 seconds    Period Weeks    Status Partially Met                 Plan - 10/18/20 1143    Clinical Impression Statement Patient continues to have difficulty with walking reports that she gets SOB with walking, she reports that she is going to try the current regime of meds from MD and will recheck next week, she has a lot of wheezing with the walking.  I stopped the hip abduction due to shoulder popping.  She was able to do the one 4" step up and down with CGA and a lot of cues.  The walking is less than the last period but seems to be due to changes in medication and this will need to be monitored and try to get back to PLOF without the wheezing and try to get back to the distance, would also like for her to be able to do with S only    PT Frequency 2x / week    PT Duration 4 weeks    PT Treatment/Interventions ADLs/Self Care Home Management;Cryotherapy;Electrical Stimulation;Moist Heat;Gait training;Functional mobility training;Therapeutic activities;Therapeutic exercise;Balance training;Neuromuscular re-education;Patient/family education;Manual techniques    PT Next Visit Plan work on progressing her function and independence    Consulted and Agree with Plan of Care Patient           Patient will benefit from skilled therapeutic intervention in order to improve the following deficits and impairments:  Abnormal gait,Cardiopulmonary status limiting activity,Decreased activity tolerance,Decreased balance,Decreased mobility,Decreased strength,Decreased coordination,Decreased range of motion,Difficulty walking,Increased muscle spasms,Pain  Visit Diagnosis: Hemiplegia and hemiparesis following cerebral infarction affecting left non-dominant side ([place]) - Plan: PT plan of care cert/re-cert  Muscle weakness (generalized) - Plan:  PT plan of care cert/re-cert  Ataxia - Plan: PT plan of care cert/re-cert  Abnormal posture - Plan: PT plan of care cert/re-cert  Difficulty in walking, not elsewhere classified - Plan: PT plan  of care cert/re-cert  Late effects of CVA (cerebrovascular accident) - Plan: PT plan of care cert/re-cert  Ataxia, unspecified - Plan: PT plan of care cert/re-cert     Problem List Patient Active Problem List   Diagnosis Date Noted  . Orthostatic hypotension 10/15/2020  . Presbycusis of both ears 03/08/2020  . Mixed stress and urge urinary incontinence 12/02/2019  . Macrocytosis 12/01/2019  . Nutritional anemia 12/01/2019  . S/P total knee replacement 01/27/2019  . Recurrent left knee instability 07/05/2018  . Respiratory failure with hypoxia ([place]) 08/30/2017  . Hypoxemia   . Heart failure with preserved ejection fraction ([place]), Grade 3 diastolic dysfunction 62/83/6629  . PAF (paroxysmal atrial fibrillation) ([place])   . Dyspnea 03/19/2017  . Encounter for preventive health examination 02/17/2016  . Sensorineural hearing loss (SNHL), bilateral 01/26/2016  . Morbid obesity ([place]) 06/17/2015  . Hypomagnesemia 04/24/2014  . Hemiparesis affecting left side as late effect of cerebrovascular accident ([place]) 04/24/2014  . Nontraumatic cerebral hemorrhage ([place]) 04/30/2012  . DM (diabetes mellitus) with complications ([place]) 47/65/4650  . OSTEOPENIA 01/21/2009  . UNSPECIFIED VITAMIN D DEFICIENCY 11/19/2007  . HYPERCHOLESTEROLEMIA 10/25/2006  . GASTROESOPHAGEAL REFLUX, NO ESOPHAGITIS 10/25/2006  . DIVERTICULOSIS OF COLON 10/25/2006  . Osteoarthritis 10/25/2006  . CERVICAL SPINE DISORDER, NOS 10/25/2006    Sumner Boast., PT 10/18/2020, 11:51 AM  [place]. [place], [place], [address] Phone: [phone]   Fax:  [phone]  Name: Karen Jackson MRN: [id number] Date of Birth: [birth year]

## 2020-10-22 ENCOUNTER — Other Ambulatory Visit: Payer: Self-pay

## 2020-10-22 ENCOUNTER — Ambulatory Visit: Payer: Medicare Other | Admitting: Physical Therapy

## 2020-10-22 ENCOUNTER — Encounter: Payer: Self-pay | Admitting: Physical Therapy

## 2020-10-22 ENCOUNTER — Ambulatory Visit: Payer: Medicare Other | Admitting: Occupational Therapy

## 2020-10-22 ENCOUNTER — Encounter: Payer: Self-pay | Admitting: Occupational Therapy

## 2020-10-22 DIAGNOSIS — I69354 Hemiplegia and hemiparesis following cerebral infarction affecting left non-dominant side: Secondary | ICD-10-CM | POA: Diagnosis not present

## 2020-10-22 DIAGNOSIS — R270 Ataxia, unspecified: Secondary | ICD-10-CM

## 2020-10-22 DIAGNOSIS — R278 Other lack of coordination: Secondary | ICD-10-CM | POA: Diagnosis not present

## 2020-10-22 DIAGNOSIS — R262 Difficulty in walking, not elsewhere classified: Secondary | ICD-10-CM

## 2020-10-22 DIAGNOSIS — I6990 Unspecified sequelae of unspecified cerebrovascular disease: Secondary | ICD-10-CM

## 2020-10-22 DIAGNOSIS — R293 Abnormal posture: Secondary | ICD-10-CM

## 2020-10-22 DIAGNOSIS — M6281 Muscle weakness (generalized): Secondary | ICD-10-CM | POA: Diagnosis not present

## 2020-10-22 NOTE — Therapy (Signed)
[facility]. [place], [place], [address] Phone: [phone]   Fax:  [phone]  Physical Therapy Treatment  Patient Details  Name: Karen Jackson MRN: [id number] Date of Birth: 1940/09/22 Referring Provider (PT): Hensel   Encounter Date: 10/22/2020   PT End of Session - 10/22/20 1157    Visit Number 34    Date for PT Re-Evaluation 11/16/20    PT Start Time 1100    PT Stop Time 1142    PT Time Calculation (min) 42 min    Activity Tolerance Patient tolerated treatment well    Behavior During Therapy [facility] for tasks assessed/performed           Past Medical History:  Diagnosis Date  . Acute cystitis without hematuria   . Acute diastolic CHF (congestive heart failure) ([place])   . Arthritis   . Dyspnea   . Dysrhythmia   . Fever of unknown origin 03/19/2017  . Hyperlipidemia   . Hypertension    denies at preop  . Multifocal pneumonia   . Neuromuscular disorder ([place])    neuropathy left arm and foot  . Osteopenia   . Paralysis ([place])    partial left side from CVA   . Persistent atrial fibrillation ([place])   . PONV (postoperative nausea and vomiting)   . Pre-diabetes   . Stroke [facility]) 2013   hemmorahgic    Past Surgical History:  Procedure Laterality Date  . ANKLE SURGERY    . APPENDECTOMY    . CHOLECYSTECTOMY    . HERNIA REPAIR     Esophagus  . JOINT REPLACEMENT     total- right partial- left  . MASTECTOMY PARTIAL / LUMPECTOMY  2012   left  . ORIF ANKLE FRACTURE Left 07/20/2018   Procedure: OPEN REDUCTION INTERNAL FIXATION (ORIF) ANKLE FRACTURE;  Surgeon: Wylene Simmer, MD;  Location: [facility];  Service: Orthopedics;  Laterality: Left;  . TOTAL KNEE ARTHROPLASTY Left 01/27/2019   Procedure: TOTAL KNEE ARTHROPLASTY;  Surgeon: Vickey Huger, MD;  [facility];  Service: Orthopedics;  Laterality: Left;    There were no vitals filed for this visit.   Subjective Assessment - 10/22/20 1116    Subjective  PAtient reports better today with less breathing problems, will see the MD next week and see about the changes in medication    Currently in Pain? Yes    Pain Score 5     Pain Location Shoulder    Pain Orientation Left                             [facility] Adult PT Treatment/Exercise - 10/22/20 0001      Ambulation/Gait   Gait Comments gait with FWW, 70' x 2 than 100 feet SBA      Posture/Postural Control   Posture Comments tried working on standing with light HHA and proper posture, her tolerance was 1 minute, c/o shoulder pain      Knee/Hip Exercises: Aerobic   Nustep level 6 x 500 steps 8 minutes, 20 seconds      Knee/Hip Exercises: Standing   Knee Flexion Both;2 sets;10 reps    Knee Flexion Limitations 2.5#    Hip Abduction Both;1 set;10 reps    Abduction Limitations 2#    Other Standing Knee Exercises standing right arm 5# with pulley row and extension  PT Short Term Goals - 06/18/20 1105      PT SHORT TERM GOAL #1   Title independent with initial HEP    Status Partially Met             PT Long Term Goals - 10/22/20 1159      PT LONG TERM GOAL #1   Title walk with hand hold assist x 100 feet    Status Partially Met                 Plan - 10/22/20 1157    Clinical Impression Statement The SOB was better today, she had more issues with popping and painful shoulders.  I tried to add pulling in standing for core activation and balance, this wa difficult for her, I tried posture in standing with a very light HHA, this was diffiuclt and she c/o shoulder pain    PT Next Visit Plan work on progressing her function and independence    Consulted and Agree with Plan of Care Patient           Patient will benefit from skilled therapeutic intervention in order to improve the following deficits and impairments:  Abnormal gait,Cardiopulmonary status limiting activity,Decreased activity tolerance,Decreased balance,Decreased  mobility,Decreased strength,Decreased coordination,Decreased range of motion,Difficulty walking,Increased muscle spasms,Pain  Visit Diagnosis: Hemiplegia and hemiparesis following cerebral infarction affecting left non-dominant side (HCC)  Muscle weakness (generalized)  Ataxia  Abnormal posture  Difficulty in walking, not elsewhere classified  Late effects of CVA (cerebrovascular accident)     Problem List Patient Active Problem List   Diagnosis Date Noted  . Orthostatic hypotension 10/15/2020  . Presbycusis of both ears 03/08/2020  . Mixed stress and urge urinary incontinence 12/02/2019  . Macrocytosis 12/01/2019  . Nutritional anemia 12/01/2019  . S/P total knee replacement 01/27/2019  . Recurrent left knee instability 07/05/2018  . Respiratory failure with hypoxia ([place]) 08/30/2017  . Hypoxemia   . Heart failure with preserved ejection fraction ([place]), Grade 3 diastolic dysfunction 16/05/9603  . PAF (paroxysmal atrial fibrillation) ([place])   . Dyspnea 03/19/2017  . Encounter for preventive health examination 02/17/2016  . Sensorineural hearing loss (SNHL), bilateral 01/26/2016  . Morbid obesity ([place]) 06/17/2015  . Hypomagnesemia 04/24/2014  . Hemiparesis affecting left side as late effect of cerebrovascular accident ([place]) 04/24/2014  . Nontraumatic cerebral hemorrhage ([place]) 04/30/2012  . DM (diabetes mellitus) with complications ([place]) 54/04/8118  . OSTEOPENIA 01/21/2009  . UNSPECIFIED VITAMIN D DEFICIENCY 11/19/2007  . HYPERCHOLESTEROLEMIA 10/25/2006  . GASTROESOPHAGEAL REFLUX, NO ESOPHAGITIS 10/25/2006  . DIVERTICULOSIS OF COLON 10/25/2006  . Osteoarthritis 10/25/2006  . CERVICAL SPINE DISORDER, NOS 10/25/2006    Karen Jackson., PT 10/22/2020, 12:00 PM  [place] [address]. [place], [place], [address] Phone: [phone]   Fax:  [phone]  Name: Karen Jackson MRN: [id number] Date of Birth:  1941/07/27

## 2020-10-23 NOTE — Therapy (Signed)
[facility]. [place], [place], [address] Phone: [phone]   Fax:  [phone]  Occupational Therapy Treatment  Patient Details  Name: Karen Jackson MRN: [id number] Date of Birth: 11-Apr-1941 Referring Provider (OT): Madison Hickman   Encounter Date: 10/22/2020   OT End of Session - 10/22/20 1646    Visit Number 15    Number of Visits 19    Date for OT Re-Evaluation 11/05/20    Authorization Type Medicare    Progress Note Due on Visit 20    OT Start Time 1015    OT Stop Time 1059    OT Time Calculation (min) 44 min    Activity Tolerance Patient tolerated treatment well    Behavior During Therapy [facility] for tasks assessed/performed           Past Medical History:  Diagnosis Date  . Acute cystitis without hematuria   . Acute diastolic CHF (congestive heart failure) ([place])   . Arthritis   . Dyspnea   . Dysrhythmia   . Fever of unknown origin 03/19/2017  . Hyperlipidemia   . Hypertension    denies at preop  . Multifocal pneumonia   . Neuromuscular disorder ([place])    neuropathy left arm and foot  . Osteopenia   . Paralysis ([place])    partial left side from CVA   . Persistent atrial fibrillation ([place])   . PONV (postoperative nausea and vomiting)   . Pre-diabetes   . Stroke [facility]) 2013   hemmorahgic    Past Surgical History:  Procedure Laterality Date  . ANKLE SURGERY    . APPENDECTOMY    . CHOLECYSTECTOMY    . HERNIA REPAIR     Esophagus  . JOINT REPLACEMENT     total- right partial- left  . MASTECTOMY PARTIAL / LUMPECTOMY  2012   left  . ORIF ANKLE FRACTURE Left 07/20/2018   Procedure: OPEN REDUCTION INTERNAL FIXATION (ORIF) ANKLE FRACTURE;  Surgeon: Wylene Simmer, MD;  Location: [facility];  Service: Orthopedics;  Laterality: Left;  . TOTAL KNEE ARTHROPLASTY Left 01/27/2019   Procedure: TOTAL KNEE ARTHROPLASTY;  Surgeon: Vickey Huger, MD;  Location[facility];  Service: Orthopedics;  Laterality: Left;     There were no vitals filed for this visit.   Subjective Assessment - 10/22/20 1024    Subjective  Pt reports that the rainy day yesterday seemed to have her L shoulder feeling very painful; she also reports not liking the platform she tried during ambulation w/ her RW    Pertinent History CVA with L hemiparesis (2013), OA, HTN, CHF, pre-diabetes    Limitations Pain in LUE, impaired sensation, decreased strength and coordination, decreased PROM/AROM    Patient Stated Goals Improve functional use of LUE, decrease pain    Currently in Pain? Yes    Pain Score 5     Pain Location Shoulder    Pain Orientation Left    Pain Descriptors / Indicators Aching    Pain Type Chronic pain    Pain Onset More than a month ago    Pain Frequency Constant            OT Treatments/Exercises (OP) - 10/22/20 1655      Visual/Perceptual Exercises   Copy this Image Pegboard    Pegboard Pt copied medium-level pegboard pattern w/ no limitations after initally experiencing difficulty understanding how to initiate the pattern   First half of pegboard pattern completed w/ L hand and second  half completed with R hand to facilitate strengthening in both hands     Neurological Re-education Exercises   Other Grasp and Release Exercises  Alternating between grasp and slight release w/ L hand, allowing 1 lb. dowel rod to inch slowly through hand used to practice force gradation while holding handle during ambulation w/ RW   Pt able to vary grasp effectively and OT provided education on incorporating this when holding on to RW     Modalities   Modalities Moist Heat   Pegboard activities completed during moist heat therapy     Moist Heat Therapy   Number Minutes Moist Heat 15 Minutes    Moist Heat Location Shoulder      Splinting   Splinting OT donned LUE brace for shoulder support at start of session      Fine Motor Coordination (Hand/Wrist)   Small Pegboard Wooden pegboard puzzle used to facilitate [facility] of L  hand and eye-hand coordination. Pt demo'd difficulty manipulating pieces w/ L hand and OT graded activity to incorporate bilateral coordination; success improved   Pt also experienced difficulty copying pattern from picture w/ cues and modeling           OT Education - 10/22/20 1726    Education Details Education provided on joint protection strategies    Person(s) Educated Patient    Methods Explanation    Comprehension Verbalized understanding            OT Short Term Goals - 10/22/20 1650      OT SHORT TERM GOAL #1   Title Pt will don/doff zip-up jacket with Min A in at least 1/2 trials.    Baseline Mod A to don/doff zip-up jacket    Time 3    Period Weeks    Status On-going    Target Date 10/29/20      OT SHORT TERM GOAL #2   Title Per self-report, pt will report pain less than 3/10 in LUE when ambulating with RW at home at least 75% of the time.    Baseline Pt reports pain in LUE when ambulating with RW in the home.    Time 3    Period Weeks    Status Revised             OT Long Term Goals - 10/22/20 1651      OT LONG TERM GOAL #1   Title Pt will demonstrate/verbalize understanding of compensation and joint protection strategies to decrease pain and increase independence during ADLs.    Baseline Currently Mod A with dressing; participation in ADLs limited by pain    Time 6    Period Weeks    Status Partially Met      OT LONG TERM GOAL #2   Title Pt will improve functional FM coordination of as evidenced by ability to place >50% of pegs into pegboard with LUE.    Baseline Unable to place small pegs with LUE during eval.    Time 6    Period Weeks    Status Achieved   Pt able to place ~75% of pegs into pegboard without dropping (10/05/20)     OT LONG TERM GOAL #3   Title Pt will be independent with HEP for BUE strengthening and GM coordination to improve independence with functional tasks.    Baseline Pt reports no HEP for UE strength/coordination.    Time 6     Period Weeks    Status Partially Met   Pt reports  compliance with current HEP (10/05/20)     OT LONG TERM GOAL #4   Title Pt will improve functional grasp of L hand as evidenced by increasing grip strength by >9 lbs.    Baseline LUE = 11 lbs; RUE = 33 lbs    Time 6    Period Weeks    Status Achieved   LUE = 21 lbs; RUE = 36 lbs           Plan - 10/22/20 1726    Clinical Impression Statement Pt experienced some difficulty with visual perceptual activities this session; will continue to assess in functional context. Due to platform walker attachment not being beneficial for pt, OT began addressing other options, including facilitation of appropriate force gradation.    OT Occupational Profile and History Problem Focused Assessment - Including review of records relating to presenting problem    Occupational performance deficits (Please refer to evaluation for details): ADL's;IADL's    Body Structure / Function / Physical Skills ADL;UE functional use;Balance;Body mechanics;Pain;FMC;Proprioception;ROM;Coordination;GMC;Sensation;Strength;Dexterity    Rehab Potential Fair    Clinical Decision Making Several treatment options, min-mod task modification necessary    Comorbidities Affecting Occupational Performance: May have comorbidities impacting occupational performance    Modification or Assistance to Complete Evaluation  Min-Moderate modification of tasks or assist with assess necessary to complete eval    OT Frequency 2x / week    OT Duration 6 weeks    OT Treatment/Interventions Self-care/ADL training;Ultrasound;Compression bandaging;DME and/or AE instruction;Patient/family education;Passive range of motion;Electrical Stimulation;Splinting;Moist Heat;Therapeutic exercise;Manual Therapy;Therapeutic activities;Neuromuscular education;Energy conservation;Iontophoresis;Cryotherapy;Functional Mobility Training;Aquatic Therapy    Plan continue with PoC    Consulted and Agree with Plan of Care  Patient           Patient will benefit from skilled therapeutic intervention in order to improve the following deficits and impairments:   Body Structure / Function / Physical Skills: ADL,UE functional use,Balance,Body mechanics,Pain,FMC,Proprioception,ROM,Coordination,GMC,Sensation,Strength,Dexterity       Visit Diagnosis: Hemiplegia and hemiparesis following cerebral infarction affecting left non-dominant side (HCC)  Muscle weakness (generalized)  Ataxia  Other lack of coordination    Problem List Patient Active Problem List   Diagnosis Date Noted  . Orthostatic hypotension 10/15/2020  . Presbycusis of both ears 03/08/2020  . Mixed stress and urge urinary incontinence 12/02/2019  . Macrocytosis 12/01/2019  . Nutritional anemia 12/01/2019  . S/P total knee replacement 01/27/2019  . Recurrent left knee instability 07/05/2018  . Respiratory failure with hypoxia ([place]) 08/30/2017  . Hypoxemia   . Heart failure with preserved ejection fraction ([place]), Grade 3 diastolic dysfunction 69/67/8938  . PAF (paroxysmal atrial fibrillation) ([place])   . Dyspnea 03/19/2017  . Encounter for preventive health examination 02/17/2016  . Sensorineural hearing loss (SNHL), bilateral 01/26/2016  . Morbid obesity ([place]) 06/17/2015  . Hypomagnesemia 04/24/2014  . Hemiparesis affecting left side as late effect of cerebrovascular accident ([place]) 04/24/2014  . Nontraumatic cerebral hemorrhage ([place]) 04/30/2012  . DM (diabetes mellitus) with complications ([place]) 06/13/5101  . OSTEOPENIA 01/21/2009  . UNSPECIFIED VITAMIN D DEFICIENCY 11/19/2007  . HYPERCHOLESTEROLEMIA 10/25/2006  . GASTROESOPHAGEAL REFLUX, NO ESOPHAGITIS 10/25/2006  . DIVERTICULOSIS OF COLON 10/25/2006  . Osteoarthritis 10/25/2006  . CERVICAL SPINE DISORDER, NOS 10/25/2006     Kathrine Cords, OTR/L, MSOT 10/23/2020, 5:30 PM  [facility]. [place], [place],  [address] Phone: [phone]   Fax:  [phone]  Name: AHMIA COLFORD MRN: [id number] Date of Birth: 1941-08-12

## 2020-10-25 ENCOUNTER — Encounter: Payer: Self-pay | Admitting: Occupational Therapy

## 2020-10-25 ENCOUNTER — Encounter: Payer: Self-pay | Admitting: Physical Therapy

## 2020-10-25 ENCOUNTER — Ambulatory Visit: Payer: Medicare Other | Admitting: Physical Therapy

## 2020-10-25 ENCOUNTER — Ambulatory Visit: Payer: Medicare Other | Admitting: Occupational Therapy

## 2020-10-25 ENCOUNTER — Other Ambulatory Visit: Payer: Self-pay

## 2020-10-25 DIAGNOSIS — R278 Other lack of coordination: Secondary | ICD-10-CM

## 2020-10-25 DIAGNOSIS — R262 Difficulty in walking, not elsewhere classified: Secondary | ICD-10-CM

## 2020-10-25 DIAGNOSIS — R270 Ataxia, unspecified: Secondary | ICD-10-CM

## 2020-10-25 DIAGNOSIS — R293 Abnormal posture: Secondary | ICD-10-CM

## 2020-10-25 DIAGNOSIS — M6281 Muscle weakness (generalized): Secondary | ICD-10-CM

## 2020-10-25 DIAGNOSIS — I69354 Hemiplegia and hemiparesis following cerebral infarction affecting left non-dominant side: Secondary | ICD-10-CM

## 2020-10-25 DIAGNOSIS — I6990 Unspecified sequelae of unspecified cerebrovascular disease: Secondary | ICD-10-CM

## 2020-10-25 DIAGNOSIS — I6930 Unspecified sequelae of cerebral infarction: Secondary | ICD-10-CM

## 2020-10-25 DIAGNOSIS — M25512 Pain in left shoulder: Secondary | ICD-10-CM

## 2020-10-25 NOTE — Therapy (Signed)
[facility]. [place], [place], [address] Phone: [phone]   Fax:  [phone]  Physical Therapy Treatment  Patient Details  Name: Karen Jackson MRN: [id number] Date of Birth: 05-07-1941 Referring Provider (PT): [place]   Encounter Date: 10/25/2020   PT End of Session - 10/25/20 1148    Visit Number 35    Date for PT Re-Evaluation 11/16/20    PT Start Time 1058    PT Stop Time 1139    PT Time Calculation (min) 41 min    Activity Tolerance Patient tolerated treatment well    Behavior During Therapy [facility] for tasks assessed/performed           Past Medical History:  Diagnosis Date  . Acute cystitis without hematuria   . Acute diastolic CHF (congestive heart failure) ([place])   . Arthritis   . Dyspnea   . Dysrhythmia   . Fever of unknown origin 03/19/2017  . Hyperlipidemia   . Hypertension    denies at preop  . Multifocal pneumonia   . Neuromuscular disorder ([place])    neuropathy left arm and foot  . Osteopenia   . Paralysis ([place])    partial left side from CVA   . Persistent atrial fibrillation ([place])   . PONV (postoperative nausea and vomiting)   . Pre-diabetes   . Stroke [facility]) 2013   hemmorahgic    Past Surgical History:  Procedure Laterality Date  . ANKLE SURGERY    . APPENDECTOMY    . CHOLECYSTECTOMY    . HERNIA REPAIR     Esophagus  . JOINT REPLACEMENT     total- right partial- left  . MASTECTOMY PARTIAL / LUMPECTOMY  2012   left  . ORIF ANKLE FRACTURE Left 07/20/2018   Procedure: OPEN REDUCTION INTERNAL FIXATION (ORIF) ANKLE FRACTURE;  Surgeon: Wylene Simmer, MD;  Location: [facility];  Service: Orthopedics;  Laterality: Left;  . TOTAL KNEE ARTHROPLASTY Left 01/27/2019   Procedure: TOTAL KNEE ARTHROPLASTY;  Surgeon: Vickey Huger, MD;  [facility];  Service: Orthopedics;  Laterality: Left;    There were no vitals filed for this visit.   Subjective Assessment - 10/25/20 1058    Subjective  Patient reports much more pain in the left shoulder/upper trap starting yesterday, is unsure of why, except the weather    Currently in Pain? Yes    Pain Score 8     Pain Location Shoulder   upper trap   Pain Orientation Left    Pain Descriptors / Indicators Aching;Spasm    Aggravating Factors  movement, reaching makes the pain worse                             [facility] Adult PT Treatment/Exercise - 10/25/20 0001      Ambulation/Gait   Gait Comments gait FWW, 4x 70 feet, did not go more than this today due to the elevated left shoulder/upper trap pain.      Knee/Hip Exercises: Aerobic   Nustep level 6 x 500 steps 8 minutes, 20 seconds, did not use the left arm today due to pain      Knee/Hip Exercises: Seated   Long Arc Quad Both;3 sets;10 reps    Long Arc Quad Weight 5 lbs.    Other Seated Knee/Hip Exercises 5# marches    Other Seated Knee/Hip Exercises green tband ankle PF/DF, left ankle on sit fit motions, left hip adduction  Hamstring Curl Both;3 sets;10 reps    Hamstring Limitations blue tband cues for ROM      Manual Therapy   Manual Therapy Soft tissue mobilization    Soft tissue mobilization left upper trap and into the neck area, some use of vibration                    PT Short Term Goals - 06/18/20 1105      PT SHORT TERM GOAL #1   Title independent with initial HEP    Status Partially Met             PT Long Term Goals - 10/22/20 1159      PT LONG TERM GOAL #1   Title walk with hand hold assist x 100 feet    Status Partially Met                 Plan - 10/25/20 1149    Clinical Impression Statement Patient with much increased left shoulder pain today, she is unsure of the cause, it was cold and wet yesterday, I did go back and look at what we did last Friday, she had pain with working on standing posture.  She reports that she did not have pain on Saturday.  I added some STM to the left upper trap and into the neck area,  she is very tight and tender here.  I backed off of walking and had her not use the left arm with the NUstep to see if this would help.    PT Next Visit Plan work on progressing her function and independence    Consulted and Agree with Plan of Care Patient           Patient will benefit from skilled therapeutic intervention in order to improve the following deficits and impairments:  Abnormal gait,Cardiopulmonary status limiting activity,Decreased activity tolerance,Decreased balance,Decreased mobility,Decreased strength,Decreased coordination,Decreased range of motion,Difficulty walking,Increased muscle spasms,Pain  Visit Diagnosis: Hemiplegia and hemiparesis following cerebral infarction affecting left non-dominant side (HCC)  Muscle weakness (generalized)  Ataxia  Abnormal posture  Difficulty in walking, not elsewhere classified  Late effects of CVA (cerebrovascular accident)  Left shoulder pain, unspecified chronicity     Problem List Patient Active Problem List   Diagnosis Date Noted  . Orthostatic hypotension 10/15/2020  . Presbycusis of both ears 03/08/2020  . Mixed stress and urge urinary incontinence 12/02/2019  . Macrocytosis 12/01/2019  . Nutritional anemia 12/01/2019  . S/P total knee replacement 01/27/2019  . Recurrent left knee instability 07/05/2018  . Respiratory failure with hypoxia ([place]) 08/30/2017  . Hypoxemia   . Heart failure with preserved ejection fraction ([place]), Grade 3 diastolic dysfunction 16/05/9603  . PAF (paroxysmal atrial fibrillation) ([place])   . Dyspnea 03/19/2017  . Encounter for preventive health examination 02/17/2016  . Sensorineural hearing loss (SNHL), bilateral 01/26/2016  . Morbid obesity ([place]) 06/17/2015  . Hypomagnesemia 04/24/2014  . Hemiparesis affecting left side as late effect of cerebrovascular accident ([place]) 04/24/2014  . Nontraumatic cerebral hemorrhage ([place]) 04/30/2012  . DM (diabetes mellitus) with complications ([place])  54/04/8118  . OSTEOPENIA 01/21/2009  . UNSPECIFIED VITAMIN D DEFICIENCY 11/19/2007  . HYPERCHOLESTEROLEMIA 10/25/2006  . GASTROESOPHAGEAL REFLUX, NO ESOPHAGITIS 10/25/2006  . DIVERTICULOSIS OF COLON 10/25/2006  . Osteoarthritis 10/25/2006  . CERVICAL SPINE DISORDER, NOS 10/25/2006    Sumner Boast., PT 10/25/2020, 11:53 AM  [facility]. [place], [place], [address] Phone: [phone]   Fax:  [phone]  Name: Karen Jackson MRN: [id number] Date of Birth: February 01, 1941

## 2020-10-25 NOTE — Therapy (Signed)
[address] [place], [place], [address] Phone: [phone]   Fax:  [phone]  Occupational Therapy Treatment  Patient Details  Name: Karen Jackson MRN: [id number] Date of Birth: Jan 26, 1941 Referring Provider (OT): Madison Hickman   Encounter Date: 10/25/2020   OT End of Session - 10/25/20 1023    Visit Number 16    Number of Visits 19    Date for OT Re-Evaluation 11/05/20    Authorization Type Medicare    Progress Note Due on Visit 57    OT Start Time 1015    OT Stop Time 1058    OT Time Calculation (min) 43 min    Activity Tolerance Patient tolerated treatment well;Patient limited by pain    Behavior During Therapy [facility] for tasks assessed/performed           Past Medical History:  Diagnosis Date  . Acute cystitis without hematuria   . Acute diastolic CHF (congestive heart failure) ([place])   . Arthritis   . Dyspnea   . Dysrhythmia   . Fever of unknown origin 03/19/2017  . Hyperlipidemia   . Hypertension    denies at preop  . Multifocal pneumonia   . Neuromuscular disorder ([place])    neuropathy left arm and foot  . Osteopenia   . Paralysis ([place])    partial left side from CVA   . Persistent atrial fibrillation ([place])   . PONV (postoperative nausea and vomiting)   . Pre-diabetes   . Stroke [facility]) 2013   hemmorahgic    Past Surgical History:  Procedure Laterality Date  . ANKLE SURGERY    . APPENDECTOMY    . CHOLECYSTECTOMY    . HERNIA REPAIR     Esophagus  . JOINT REPLACEMENT     total- right partial- left  . MASTECTOMY PARTIAL / LUMPECTOMY  2012   left  . ORIF ANKLE FRACTURE Left 07/20/2018   Procedure: OPEN REDUCTION INTERNAL FIXATION (ORIF) ANKLE FRACTURE;  Surgeon: Wylene Simmer, MD;  Location: [facility];  Service: Orthopedics;  Laterality: Left;  . TOTAL KNEE ARTHROPLASTY Left 01/27/2019   Procedure: TOTAL KNEE ARTHROPLASTY;  Surgeon: Vickey Huger, MD;  Location[facility];  Service: Orthopedics;   Laterality: Left;    There were no vitals filed for this visit.   Subjective Assessment - 10/25/20 1022    Subjective  Pt reports her L shouler to elbow is really hurting her (started yesterday) and that she has taken Celebrex and Tylenol this morning    Pertinent History CVA with L hemiparesis (2013), OA, HTN, CHF, pre-diabetes    Limitations Pain in LUE, impaired sensation, decreased strength and coordination, decreased PROM/AROM    Patient Stated Goals Improve functional use of LUE, decrease pain    Currently in Pain? Yes    Pain Score 8     Pain Location Shoulder    Pain Orientation Left    Pain Descriptors / Indicators Aching    Pain Type Chronic pain    Pain Radiating Towards R shoulder to elbow    Pain Onset More than a month ago    Pain Frequency Constant            OT Treatments/Exercises (OP) - 10/25/20 1348      Shoulder Exercises: Seated   Extension Strengthening;Left;20 reps;Theraband   Completed 4 sets of 5 reps with OT holding band out in front of pt   Theraband Level (Shoulder Extension) Level 1 (Yellow)  External Rotation Strengthening;Left;20 reps;Theraband   Completed 4 sets of 5 reps w/ OT holding theraband on contralateral side   Theraband Level (Shoulder External Rotation) Level 1 (Yellow)    Internal Rotation Strengthening;Left;20 reps;Theraband   Completed 4 sets of 5 w/ OT holding theraband on ipsilateral side. Pt reported most pain w/ internal rotation compared w/ other exercises; OT cued pt to pronate forearm and maintain flexion of elbow to 90 degrees; pt reported pain improved some   Flexion Strengthening;Left;20 reps;Theraband   Completed 4 sets of 5 reps w/ OT holding theraband behind pt     Hand Exercises   Other Hand Exercises Go Fish played with pt to improve [facility] and in-hand manipulation of L hand; pt instructed to open hand completely prior to retrieving individual cards off deck   Pt demo'd increased FM coordination w/ small/standard cards vs.  large cards   Other Hand Exercises Full gross grasp against resistance ball completed 10x each hand for strengthening      Modalities   Modalities Moist Heat   Pt completed shoulder strengthening exercises during moist heat therapy     Moist Heat Therapy   Number Minutes Moist Heat 15 Minutes    Moist Heat Location Shoulder      Splinting   Splinting OT donned LUE brace for shoulder support at start of session            OT Short Term Goals - 10/25/20 1538      OT SHORT TERM GOAL #1   Title Pt will don/doff zip-up jacket with Min A in at least 1/2 trials.    Baseline Mod A to don/doff zip-up jacket    Time 3    Period Weeks    Status On-going    Target Date 10/29/20      OT SHORT TERM GOAL #2   Title Per self-report, pt will report pain less than 3/10 in LUE when ambulating with RW at home at least 75% of the time.    Baseline Pt reports pain in LUE when ambulating with RW in the home.    Time 3    Period Weeks    Status On-going            OT Long Term Goals - 10/25/20 1548      OT LONG TERM GOAL #1   Title Pt will demonstrate/verbalize understanding of compensation and joint protection strategies to decrease pain and increase independence during ADLs.    Baseline Currently Mod A with dressing; participation in ADLs limited by pain    Time 6    Period Weeks    Status Achieved   10/25/20 - pt verbalized understanding of joint protection strategies and states comfort w/ instructing caregivers during ADLs   Target Date 11/05/20      OT LONG TERM GOAL #2   Title Pt will improve functional FM coordination of as evidenced by ability to place >50% of pegs into pegboard with LUE.    Baseline Unable to place small pegs with LUE during eval.    Time 6    Period Weeks    Status Achieved   10/05/20 - pt able to place ~75% of pegs into pegboard without dropping     OT LONG TERM GOAL #3   Title Pt will be independent with HEP for BUE strengthening and GM coordination to  improve independence with functional tasks.    Baseline Pt reports no HEP for UE strength/coordination.    Time 6  Period Weeks    Status Partially Met      OT LONG TERM GOAL #4   Title Pt will improve functional grasp of L hand as evidenced by increasing grip strength by >9 lbs.    Baseline LUE = 11 lbs; RUE = 33 lbs    Time 6    Period Weeks    Status Achieved   10/18/20 - LUE = 21 lbs; RUE = 36 lbs     OT LONG TERM GOAL #5   Title Pt will be able to deal standard-sized playing cards one-by-one using bilateral thumbs independently at least 80% of the time    Baseline Unable to deal cards using isolated thumb    Time 6    Period Weeks    Status New    Target Date 12/05/20            Plan - 10/25/20 1356    Clinical Impression Statement OT discussed POC moving forward w/ pt and pt was receptive, stating she wants to continue working on FM control and coordination related to IADL and leisure tasks.    OT Occupational Profile and History Problem Focused Assessment - Including review of records relating to presenting problem    Occupational performance deficits (Please refer to evaluation for details): ADL's;IADL's    Body Structure / Function / Physical Skills ADL;UE functional use;Balance;Body mechanics;Pain;FMC;Proprioception;ROM;Coordination;GMC;Sensation;Strength;Dexterity    Rehab Potential Fair    Clinical Decision Making Several treatment options, min-mod task modification necessary    Comorbidities Affecting Occupational Performance: May have comorbidities impacting occupational performance    Modification or Assistance to Complete Evaluation  Min-Moderate modification of tasks or assist with assess necessary to complete eval    OT Frequency 2x / week    OT Duration 6 weeks    OT Treatment/Interventions Self-care/ADL training;Ultrasound;Compression bandaging;DME and/or AE instruction;Patient/family education;Passive range of motion;Electrical Stimulation;Splinting;Moist  Heat;Therapeutic exercise;Manual Therapy;Therapeutic activities;Neuromuscular education;Energy conservation;Iontophoresis;Cryotherapy;Functional Mobility Training;Aquatic Therapy    Plan continue with PoC    Consulted and Agree with Plan of Care Patient           Patient will benefit from skilled therapeutic intervention in order to improve the following deficits and impairments:   Body Structure / Function / Physical Skills: ADL,UE functional use,Balance,Body mechanics,Pain,FMC,Proprioception,ROM,Coordination,GMC,Sensation,Strength,Dexterity       Visit Diagnosis: Hemiplegia and hemiparesis following cerebral infarction affecting left non-dominant side (HCC)  Other lack of coordination  Muscle weakness (generalized)  Ataxia  Abnormal posture    Problem List Patient Active Problem List   Diagnosis Date Noted  . Orthostatic hypotension 10/15/2020  . Presbycusis of both ears 03/08/2020  . Mixed stress and urge urinary incontinence 12/02/2019  . Macrocytosis 12/01/2019  . Nutritional anemia 12/01/2019  . S/P total knee replacement 01/27/2019  . Recurrent left knee instability 07/05/2018  . Respiratory failure with hypoxia ([place]) 08/30/2017  . Hypoxemia   . Heart failure with preserved ejection fraction ([place]), Grade 3 diastolic dysfunction 15/12/6977  . PAF (paroxysmal atrial fibrillation) ([place])   . Dyspnea 03/19/2017  . Encounter for preventive health examination 02/17/2016  . Sensorineural hearing loss (SNHL), bilateral 01/26/2016  . Morbid obesity ([place]) 06/17/2015  . Hypomagnesemia 04/24/2014  . Hemiparesis affecting left side as late effect of cerebrovascular accident ([place]) 04/24/2014  . Nontraumatic cerebral hemorrhage ([place]) 04/30/2012  . DM (diabetes mellitus) with complications ([place]) 48/08/6551  . OSTEOPENIA 01/21/2009  . UNSPECIFIED VITAMIN D DEFICIENCY 11/19/2007  . HYPERCHOLESTEROLEMIA 10/25/2006  . GASTROESOPHAGEAL REFLUX, NO ESOPHAGITIS 10/25/2006  .  DIVERTICULOSIS  OF COLON 10/25/2006  . Osteoarthritis 10/25/2006  . CERVICAL SPINE DISORDER, NOS 10/25/2006     Kathrine Cords, OTR/L, MSOT 10/25/2020, 4:46 PM  [address]. [place], [place], [address] Phone: [phone]   Fax:  [phone]  Name: YOSHIE KOSEL MRN: [id number] Date of Birth: Feb 17, 1941

## 2020-10-26 ENCOUNTER — Encounter: Payer: Self-pay | Admitting: Physical Therapy

## 2020-10-28 ENCOUNTER — Telehealth: Payer: Self-pay

## 2020-10-28 NOTE — Telephone Encounter (Signed)
Patient calls nurse line per provider request to report BP readings. Reports that since stopping metoprolol BP had been in the 320E-334D systolic and 56Y-61U diastolic. However, the last two days BP has been measuring in the low 837G, with diastolic in the 90S.   Patient also reports feeling slightly more congested since lowering the lasix dosage. Denies edema or difficulty breathing.   Of note, patient states that the blurry vision in her left eye is still persistent. Patient reports this has been going on since the original increase in the Ozempic dose.   Please advise next steps for patient.   Talbot Grumbling, RN

## 2020-10-28 NOTE — Telephone Encounter (Signed)
Called.  Still with rare light headed spells in the morning.  Definitely improved. We will stay off the metoprolol She will resume daily lasix. She also continues to have some blurry vision in one eye.  She has an eye doc appointment in 3 weeks.  She will call and see if she can move up that appointment.

## 2020-10-29 ENCOUNTER — Ambulatory Visit: Payer: Medicare Other | Attending: Family Medicine | Admitting: Physical Therapy

## 2020-10-29 ENCOUNTER — Encounter: Payer: Self-pay | Admitting: Physical Therapy

## 2020-10-29 ENCOUNTER — Ambulatory Visit: Payer: Medicare Other | Admitting: Occupational Therapy

## 2020-10-29 ENCOUNTER — Encounter: Payer: Self-pay | Admitting: Occupational Therapy

## 2020-10-29 ENCOUNTER — Other Ambulatory Visit: Payer: Self-pay

## 2020-10-29 DIAGNOSIS — R270 Ataxia, unspecified: Secondary | ICD-10-CM | POA: Insufficient documentation

## 2020-10-29 DIAGNOSIS — R278 Other lack of coordination: Secondary | ICD-10-CM | POA: Insufficient documentation

## 2020-10-29 DIAGNOSIS — M6281 Muscle weakness (generalized): Secondary | ICD-10-CM | POA: Insufficient documentation

## 2020-10-29 DIAGNOSIS — I6990 Unspecified sequelae of unspecified cerebrovascular disease: Secondary | ICD-10-CM | POA: Insufficient documentation

## 2020-10-29 DIAGNOSIS — M25512 Pain in left shoulder: Secondary | ICD-10-CM | POA: Diagnosis not present

## 2020-10-29 DIAGNOSIS — R262 Difficulty in walking, not elsewhere classified: Secondary | ICD-10-CM | POA: Diagnosis not present

## 2020-10-29 DIAGNOSIS — I69354 Hemiplegia and hemiparesis following cerebral infarction affecting left non-dominant side: Secondary | ICD-10-CM | POA: Insufficient documentation

## 2020-10-29 NOTE — Therapy (Signed)
[address]. [place], [place], [address] Phone: [phone]   Fax:  [phone]  Physical Therapy Treatment  Patient Details  Name: Karen Jackson MRN: [id number] Date of Birth: 05/13/1941 Referring Provider (PT): [place]   Encounter Date: 10/29/2020   PT End of Session - 10/29/20 1149    Visit Number 36    Date for PT Re-Evaluation 11/16/20    PT Start Time 0930    PT Stop Time 1010    PT Time Calculation (min) 40 min    Activity Tolerance Patient tolerated treatment well    Behavior During Therapy [facility] for tasks assessed/performed           Past Medical History:  Diagnosis Date  . Acute cystitis without hematuria   . Acute diastolic CHF (congestive heart failure) ([place])   . Arthritis   . Dyspnea   . Dysrhythmia   . Fever of unknown origin 03/19/2017  . Hyperlipidemia   . Hypertension    denies at preop  . Multifocal pneumonia   . Neuromuscular disorder ([place])    neuropathy left arm and foot  . Osteopenia   . Paralysis ([place])    partial left side from CVA   . Persistent atrial fibrillation ([place])   . PONV (postoperative nausea and vomiting)   . Pre-diabetes   . Stroke [facility]) 2013   hemmorahgic    Past Surgical History:  Procedure Laterality Date  . ANKLE SURGERY    . APPENDECTOMY    . CHOLECYSTECTOMY    . HERNIA REPAIR     Esophagus  . JOINT REPLACEMENT     total- right partial- left  . MASTECTOMY PARTIAL / LUMPECTOMY  2012   left  . ORIF ANKLE FRACTURE Left 07/20/2018   Procedure: OPEN REDUCTION INTERNAL FIXATION (ORIF) ANKLE FRACTURE;  Surgeon: Wylene Simmer, MD;  Location: [facility];  Service: Orthopedics;  Laterality: Left;  . TOTAL KNEE ARTHROPLASTY Left 01/27/2019   Procedure: TOTAL KNEE ARTHROPLASTY;  Surgeon: Vickey Huger, MD;  [facility];  Service: Orthopedics;  Laterality: Left;    There were no vitals filed for this visit.   Subjective Assessment - 10/29/20 0953    Subjective I had  a great day yesterday less pain, but it is colder today and my shoulder is hurting more    Currently in Pain? Yes    Pain Score 5     Pain Location Shoulder    Pain Orientation Left    Aggravating Factors  cool weather                             [facility] Adult PT Treatment/Exercise - 10/29/20 0001      Ambulation/Gait   Gait Comments gait with FWW 2x50 feet, then 2 x 100 feet CGA      High Level Balance   High Level Balance Comments side stepping, backward walking, 4" toe touches, weight shifts      Knee/Hip Exercises: Aerobic   Nustep level 6 x 500 steps 8 minutes, 20 seconds, did not use the left arm today due to pain      Knee/Hip Exercises: Standing   Hip Flexion Both;2 sets;10 reps    Hip Flexion Limitations 5#      Knee/Hip Exercises: Seated   Long Arc Quad Both;3 sets;10 reps    Long Arc Quad Weight 5 lbs.    Other Seated Knee/Hip Exercises green tband  ankle PF/DF, left ankle on sit fit motions, left hip adduction    Hamstring Curl Both;3 sets;10 reps    Hamstring Limitations blue tband cues for ROM                    PT Short Term Goals - 06/18/20 1105      PT SHORT TERM GOAL #1   Title independent with initial HEP    Status Partially Met             PT Long Term Goals - 10/29/20 1152      PT LONG TERM GOAL #1   Title walk with hand hold assist x 100 feet    Status Partially Met      PT LONG TERM GOAL #2   Title increased right LE strength to 4-/5    Status Partially Met      PT LONG TERM GOAL #3   Title walk 100 feet with SBA using the FWW    Status Partially Met                 Plan - 10/29/20 1150    Clinical Impression Statement Patient with less shoulder pain today.  I did a little more walking and some increased of balance activities, she needs a lot of encouragement to do as much as she can on her own.  The shoulder is a limiting factor in the activities, due to pain and popping, with cues to retract at times  this will decrease the pain and popping, at other times I cannot get this to lessen the pain.  We continue to work towards her being more and more independent at home for a higher quality of life    PT Next Visit Plan work on progressing her function and independence    Consulted and Agree with Plan of Care Patient           Patient will benefit from skilled therapeutic intervention in order to improve the following deficits and impairments:  Abnormal gait,Cardiopulmonary status limiting activity,Decreased activity tolerance,Decreased balance,Decreased mobility,Decreased strength,Decreased coordination,Decreased range of motion,Difficulty walking,Increased muscle spasms,Pain  Visit Diagnosis: Hemiplegia and hemiparesis following cerebral infarction affecting left non-dominant side (HCC)  Muscle weakness (generalized)  Ataxia  Difficulty in walking, not elsewhere classified  Late effects of CVA (cerebrovascular accident)  Left shoulder pain, unspecified chronicity     Problem List Patient Active Problem List   Diagnosis Date Noted  . Orthostatic hypotension 10/15/2020  . Presbycusis of both ears 03/08/2020  . Mixed stress and urge urinary incontinence 12/02/2019  . Macrocytosis 12/01/2019  . Nutritional anemia 12/01/2019  . S/P total knee replacement 01/27/2019  . Recurrent left knee instability 07/05/2018  . Respiratory failure with hypoxia ([place]) 08/30/2017  . Hypoxemia   . Heart failure with preserved ejection fraction ([place]), Grade 3 diastolic dysfunction 38/32/9191  . PAF (paroxysmal atrial fibrillation) ([place])   . Dyspnea 03/19/2017  . Encounter for preventive health examination 02/17/2016  . Sensorineural hearing loss (SNHL), bilateral 01/26/2016  . Morbid obesity ([place]) 06/17/2015  . Hypomagnesemia 04/24/2014  . Hemiparesis affecting left side as late effect of cerebrovascular accident ([place]) 04/24/2014  . Nontraumatic cerebral hemorrhage ([place]) 04/30/2012  . DM  (diabetes mellitus) with complications ([place]) 66/01/44  . OSTEOPENIA 01/21/2009  . UNSPECIFIED VITAMIN D DEFICIENCY 11/19/2007  . HYPERCHOLESTEROLEMIA 10/25/2006  . GASTROESOPHAGEAL REFLUX, NO ESOPHAGITIS 10/25/2006  . DIVERTICULOSIS OF COLON 10/25/2006  . Osteoarthritis 10/25/2006  . CERVICAL SPINE DISORDER, NOS 10/25/2006  Sumner Boast., PT 10/29/2020, 11:53 AM  [place]. [place], [place], [address] Phone: [phone]   Fax:  [phone]  Name: Karen Jackson MRN: [id number] Date of Birth: 1941/04/23

## 2020-10-29 NOTE — Therapy (Signed)
[place]. [place], [place], [address] Phone: [phone]   Fax:  [phone]  Occupational Therapy Treatment  Patient Details  Name: Karen Jackson MRN: [id number] Date of Birth: [birth year] Referring Provider (OT): Madison Hickman   Encounter Date: 10/29/2020   OT End of Session - 10/29/20 1041    Visit Number 17    Number of Visits 19    Date for OT Re-Evaluation 11/05/20    Authorization Type Medicare    Progress Note Due on Visit 20    OT Start Time 1017    OT Stop Time 1100    OT Time Calculation (min) 43 min    Activity Tolerance Patient tolerated treatment well;Patient limited by pain    Behavior During Therapy [facility] for tasks assessed/performed           Past Medical History:  Diagnosis Date  . Acute cystitis without hematuria   . Acute diastolic CHF (congestive heart failure) ([place])   . Arthritis   . Dyspnea   . Dysrhythmia   . Fever of unknown origin 03/19/2017  . Hyperlipidemia   . Hypertension    denies at preop  . Multifocal pneumonia   . Neuromuscular disorder ([place])    neuropathy left arm and foot  . Osteopenia   . Paralysis ([place])    partial left side from CVA   . Persistent atrial fibrillation ([place])   . PONV (postoperative nausea and vomiting)   . Pre-diabetes   . Stroke [facility]) 2013   hemmorahgic    Past Surgical History:  Procedure Laterality Date  . ANKLE SURGERY    . APPENDECTOMY    . CHOLECYSTECTOMY    . HERNIA REPAIR     Esophagus  . JOINT REPLACEMENT     total- right partial- left  . MASTECTOMY PARTIAL / LUMPECTOMY  2012   left  . ORIF ANKLE FRACTURE Left 07/20/2018   Procedure: OPEN REDUCTION INTERNAL FIXATION (ORIF) ANKLE FRACTURE;  Surgeon: Wylene Simmer, MD;  Location: [place];  Service: Orthopedics;  Laterality: Left;  . TOTAL KNEE ARTHROPLASTY Left 01/27/2019   Procedure: TOTAL KNEE ARTHROPLASTY;  Surgeon: Vickey Huger, MD;  Location[facility];  Service: Orthopedics;   Laterality: Left;    There were no vitals filed for this visit.   Subjective Assessment - 10/29/20 1204    Subjective  Pt reported pain up to an 8/10 during [facility] activity; pain alleviated after rest break    Pertinent History CVA with L hemiparesis (2013), OA, HTN, CHF, pre-diabetes    Limitations Pain in LUE, impaired sensation, decreased strength and coordination, decreased PROM/AROM    Patient Stated Goals Improve functional use of LUE, decrease pain    Currently in Pain? Yes    Pain Score 5     Pain Location Shoulder    Pain Orientation Left    Pain Descriptors / Indicators Aching;Pins and needles    Pain Type Chronic pain    Pain Radiating Towards R shoulder to elbow    Pain Onset More than a month ago    Pain Frequency Constant    Aggravating Factors  cold weather; reaching            OT Treatments/Exercises (OP) - 10/29/20 1213      ADLs   UB Dressing Max A donning zip-up jacket   Likely due to fatigue     Neurological Re-education Exercises   Digit Abduction L thumb abduction/adduction completed 2x10 w/ palm  flat on leg/tabletop surface   Included in HEP   Thumb Opposition L thumb opposition to each finger completed 10x; OT provided mod cueing and pacing strategies -- accuracy improved w/ decreased speed   Included in HEP   Other Exercises 1 L thumb composite flexion completed 2x10; OT instructed pt to block digits during exercise to isolate thumb due to ataxia   Included in HEP   Other Grasp and Release Exercises  Picking up 1" blocks off low surface w/ L hand and placing them in a bucket held at midline w/ R hand; pt instructed to open hand fully after releasing each block      Fine Motor Coordination (Hand/Wrist)   Dealing card with thumb Using L thumb to slide cards off of deck one at a time; OT provided HOHA to isolate L thumb from digits due to ataxia   Pt reported R shoulder pain and activity was d/c           OT Short Term Goals - 10/25/20 1538      OT  SHORT TERM GOAL #1   Title Pt will don/doff zip-up jacket with Min A in at least 1/2 trials.    Baseline Mod A to don/doff zip-up jacket    Time 3    Period Weeks    Status On-going    Target Date 10/29/20      OT SHORT TERM GOAL #2   Title Per self-report, pt will report pain less than 3/10 in LUE when ambulating with RW at home at least 75% of the time.    Baseline Pt reports pain in LUE when ambulating with RW in the home.    Time 3    Period Weeks    Status On-going             OT Long Term Goals - 10/29/20 1210      OT LONG TERM GOAL #1   Title Pt will demonstrate/verbalize understanding of compensation and joint protection strategies to decrease pain and increase independence during ADLs.    Baseline Currently Mod A with dressing; participation in ADLs limited by pain    Time 6    Period Weeks    Status Achieved   10/25/20 - pt verbalized understanding of joint protection strategies and states comfort w/ instructing caregivers during ADLs     OT LONG TERM GOAL #2   Title Pt will improve functional FM coordination of as evidenced by ability to place >50% of pegs into pegboard with LUE.    Baseline Unable to place small pegs with LUE during eval.    Time 6    Period Weeks    Status Achieved   10/05/20 - pt able to place ~75% of pegs into pegboard without dropping     OT LONG TERM GOAL #3   Title Pt will be independent with HEP for BUE strengthening and [place] to improve independence with functional tasks.    Baseline Pt reports no HEP for UE strength/coordination.    Time 4    Period Weeks    Status Revised    Target Date 11/26/20      OT LONG TERM GOAL #4   Title Pt will improve functional grasp of L hand as evidenced by increasing grip strength by >9 lbs.    Baseline LUE = 11 lbs; RUE = 33 lbs    Time 6    Period Weeks    Status Achieved   10/18/20 - LUE =  21 lbs; RUE = 36 lbs     OT LONG TERM GOAL #5   Title Pt will be able to deal standard-sized playing cards  one-by-one using bilateral thumbs independently at least 80% of the time    Baseline Unable to deal cards using isolated thumb    Time 6    Period Weeks    Status On-going            Plan - 10/29/20 1207    Clinical Impression Statement L shoulder pain and fatigue due to OT session succeeding PT session this morning were both limiting during therapeutic activities. Pt wishes to continue w/ OT and POC was discussed, including decreasing sessions to 1x/week to focus on maintenance; pt was receptive. OT also implementing hand/finger coordination activities to be included in HEP.    OT Occupational Profile and History Problem Focused Assessment - Including review of records relating to presenting problem    Occupational performance deficits (Please refer to evaluation for details): ADL's;IADL's    Body Structure / Function / Physical Skills ADL;UE functional use;Balance;Body mechanics;Pain;FMC;Proprioception;ROM;Coordination;GMC;Sensation;Strength;Dexterity    Rehab Potential Fair    Clinical Decision Making Several treatment options, min-mod task modification necessary    Comorbidities Affecting Occupational Performance: May have comorbidities impacting occupational performance    Modification or Assistance to Complete Evaluation  Min-Moderate modification of tasks or assist with assess necessary to complete eval    OT Frequency 2x / week    OT Duration 6 weeks    OT Treatment/Interventions Self-care/ADL training;Ultrasound;Compression bandaging;DME and/or AE instruction;Patient/family education;Passive range of motion;Electrical Stimulation;Splinting;Moist Heat;Therapeutic exercise;Manual Therapy;Therapeutic activities;Neuromuscular education;Energy conservation;Iontophoresis;Cryotherapy;Functional Mobility Training;Aquatic Therapy    Plan continue with PoC    Consulted and Agree with Plan of Care Patient           Patient will benefit from skilled therapeutic intervention in order to  improve the following deficits and impairments:   Body Structure / Function / Physical Skills: ADL,UE functional use,Balance,Body mechanics,Pain,FMC,Proprioception,ROM,Coordination,GMC,Sensation,Strength,Dexterity       Visit Diagnosis: Hemiplegia and hemiparesis following cerebral infarction affecting left non-dominant side (HCC)  Ataxia  Muscle weakness (generalized)  Other lack of coordination    Problem List Patient Active Problem List   Diagnosis Date Noted  . Orthostatic hypotension 10/15/2020  . Presbycusis of both ears 03/08/2020  . Mixed stress and urge urinary incontinence 12/02/2019  . Macrocytosis 12/01/2019  . Nutritional anemia 12/01/2019  . S/P total knee replacement 01/27/2019  . Recurrent left knee instability 07/05/2018  . Respiratory failure with hypoxia ([place]) 08/30/2017  . Hypoxemia   . Heart failure with preserved ejection fraction ([place]), Grade 3 diastolic dysfunction 82/42/3536  . PAF (paroxysmal atrial fibrillation) ([place])   . Dyspnea 03/19/2017  . Encounter for preventive health examination 02/17/2016  . Sensorineural hearing loss (SNHL), bilateral 01/26/2016  . Morbid obesity ([place]) 06/17/2015  . Hypomagnesemia 04/24/2014  . Hemiparesis affecting left side as late effect of cerebrovascular accident ([place]) 04/24/2014  . Nontraumatic cerebral hemorrhage ([place]) 04/30/2012  . DM (diabetes mellitus) with complications ([place]) 14/43/1540  . OSTEOPENIA 01/21/2009  . UNSPECIFIED VITAMIN D DEFICIENCY 11/19/2007  . HYPERCHOLESTEROLEMIA 10/25/2006  . GASTROESOPHAGEAL REFLUX, NO ESOPHAGITIS 10/25/2006  . DIVERTICULOSIS OF COLON 10/25/2006  . Osteoarthritis 10/25/2006  . CERVICAL SPINE DISORDER, NOS 10/25/2006     Kathrine Cords, OTR/L, MSOT 10/29/2020, 12:23 PM  [place]. [place], [place], [address] Phone: [phone]   Fax:  [phone]  Name: DESIRA ALESSANDRINI MRN: [id number]  Date of Birth:  12-02-1940

## 2020-11-01 ENCOUNTER — Other Ambulatory Visit: Payer: Self-pay

## 2020-11-01 ENCOUNTER — Ambulatory Visit: Payer: Medicare Other | Admitting: Physical Therapy

## 2020-11-01 ENCOUNTER — Encounter: Payer: Self-pay | Admitting: Occupational Therapy

## 2020-11-01 ENCOUNTER — Ambulatory Visit: Payer: Medicare Other | Admitting: Occupational Therapy

## 2020-11-01 ENCOUNTER — Encounter: Payer: Self-pay | Admitting: Physical Therapy

## 2020-11-01 DIAGNOSIS — I69354 Hemiplegia and hemiparesis following cerebral infarction affecting left non-dominant side: Secondary | ICD-10-CM

## 2020-11-01 DIAGNOSIS — R278 Other lack of coordination: Secondary | ICD-10-CM

## 2020-11-01 DIAGNOSIS — M6281 Muscle weakness (generalized): Secondary | ICD-10-CM

## 2020-11-01 DIAGNOSIS — R262 Difficulty in walking, not elsewhere classified: Secondary | ICD-10-CM | POA: Diagnosis not present

## 2020-11-01 DIAGNOSIS — R270 Ataxia, unspecified: Secondary | ICD-10-CM

## 2020-11-01 DIAGNOSIS — I6990 Unspecified sequelae of unspecified cerebrovascular disease: Secondary | ICD-10-CM | POA: Diagnosis not present

## 2020-11-01 DIAGNOSIS — M25512 Pain in left shoulder: Secondary | ICD-10-CM | POA: Diagnosis not present

## 2020-11-01 NOTE — Therapy (Signed)
[place]. [place], [place], [address] Phone: [phone]   Fax:  [phone]  Physical Therapy Treatment  Patient Details  Name: Karen Jackson MRN: [id number] Date of Birth: 07-19-1941 Referring Provider (PT): [place]   Encounter Date: 11/01/2020   PT End of Session - 11/01/20 1139    Visit Number 37    Date for PT Re-Evaluation 11/16/20    PT Start Time 1056    PT Stop Time 1138    PT Time Calculation (min) 42 min    Activity Tolerance Patient tolerated treatment well    Behavior During Therapy [facility] for tasks assessed/performed           Past Medical History:  Diagnosis Date  . Acute cystitis without hematuria   . Acute diastolic CHF (congestive heart failure) ([place])   . Arthritis   . Dyspnea   . Dysrhythmia   . Fever of unknown origin 03/19/2017  . Hyperlipidemia   . Hypertension    denies at preop  . Multifocal pneumonia   . Neuromuscular disorder ([place])    neuropathy left arm and foot  . Osteopenia   . Paralysis ([place])    partial left side from CVA   . Persistent atrial fibrillation ([place])   . PONV (postoperative nausea and vomiting)   . Pre-diabetes   . Stroke [facility]) 2013   hemmorahgic    Past Surgical History:  Procedure Laterality Date  . ANKLE SURGERY    . APPENDECTOMY    . CHOLECYSTECTOMY    . HERNIA REPAIR     Esophagus  . JOINT REPLACEMENT     total- right partial- left  . MASTECTOMY PARTIAL / LUMPECTOMY  2012   left  . ORIF ANKLE FRACTURE Left 07/20/2018   Procedure: OPEN REDUCTION INTERNAL FIXATION (ORIF) ANKLE FRACTURE;  Surgeon: Wylene Simmer, MD;  Location: [place];  Service: Orthopedics;  Laterality: Left;  . TOTAL KNEE ARTHROPLASTY Left 01/27/2019   Procedure: TOTAL KNEE ARTHROPLASTY;  Surgeon: Vickey Huger, MD;  [facility];  Service: Orthopedics;  Laterality: Left;    There were no vitals filed for this visit.   Subjective Assessment - 11/01/20 1101    Subjective "I  feel fine"    Currently in Pain? Yes    Pain Score 6     Pain Location Shoulder    Pain Orientation Left                             [facility] Adult PT Treatment/Exercise - 11/01/20 1105      Ambulation/Gait   Gait Comments gait with FWW 20 feet, then 2 x 100 feet CGA      Knee/Hip Exercises: Aerobic   Nustep level 5 x 500 steps 7 minutes, 37 seconds, did not use the left arm today due to pain      Knee/Hip Exercises: Seated   Long Arc Quad Both;3 sets;10 reps    Long Arc Quad Weight 5 lbs.    Other Seated Knee/Hip Exercises green tband ankle PF/DF, left ankle on sit fit motions, hip adduction manual resistance    Hamstring Curl Both;3 sets;10 reps    Hamstring Limitations blue tband cues for ROM                    PT Short Term Goals - 06/18/20 1105      PT SHORT TERM GOAL #1   Title  independent with initial HEP    Status Partially Met             PT Long Term Goals - 10/29/20 1152      PT LONG TERM GOAL #1   Title walk with hand hold assist x 100 feet    Status Partially Met      PT LONG TERM GOAL #2   Title increased right LE strength to 4-/5    Status Partially Met      PT LONG TERM GOAL #3   Title walk 100 feet with SBA using the FWW    Status Partially Met                 Plan - 11/01/20 1139    Clinical Impression Statement Good carryover from last session with gait trials but does have some fatigue. Cues needed to control reps with seated LAQ. LLE weakness noted compared to R with seated HS curls. Min assist needed to hold RW with all transfers.    Stability/Clinical Decision Making Stable/Uncomplicated    Rehab Potential Good    PT Frequency 2x / week    PT Duration 4 weeks    PT Treatment/Interventions ADLs/Self Care Home Management;Cryotherapy;Electrical Stimulation;Moist Heat;Gait training;Functional mobility training;Therapeutic activities;Therapeutic exercise;Balance training;Neuromuscular  re-education;Patient/family education;Manual techniques    PT Next Visit Plan work on progressing her function and independence           Patient will benefit from skilled therapeutic intervention in order to improve the following deficits and impairments:  Abnormal gait,Cardiopulmonary status limiting activity,Decreased activity tolerance,Decreased balance,Decreased mobility,Decreased strength,Decreased coordination,Decreased range of motion,Difficulty walking,Increased muscle spasms,Pain  Visit Diagnosis: Other lack of coordination  Muscle weakness (generalized)  Ataxia  Hemiplegia and hemiparesis following cerebral infarction affecting left non-dominant side [facility])     Problem List Patient Active Problem List   Diagnosis Date Noted  . Orthostatic hypotension 10/15/2020  . Presbycusis of both ears 03/08/2020  . Mixed stress and urge urinary incontinence 12/02/2019  . Macrocytosis 12/01/2019  . Nutritional anemia 12/01/2019  . S/P total knee replacement 01/27/2019  . Recurrent left knee instability 07/05/2018  . Respiratory failure with hypoxia ([place]) 08/30/2017  . Hypoxemia   . Heart failure with preserved ejection fraction ([place]), Grade 3 diastolic dysfunction 82/01/155  . PAF (paroxysmal atrial fibrillation) ([place])   . Dyspnea 03/19/2017  . Encounter for preventive health examination 02/17/2016  . Sensorineural hearing loss (SNHL), bilateral 01/26/2016  . Morbid obesity ([place]) 06/17/2015  . Hypomagnesemia 04/24/2014  . Hemiparesis affecting left side as late effect of cerebrovascular accident ([place]) 04/24/2014  . Nontraumatic cerebral hemorrhage ([place]) 04/30/2012  . DM (diabetes mellitus) with complications ([place]) 15/37/9432  . OSTEOPENIA 01/21/2009  . UNSPECIFIED VITAMIN D DEFICIENCY 11/19/2007  . HYPERCHOLESTEROLEMIA 10/25/2006  . GASTROESOPHAGEAL REFLUX, NO ESOPHAGITIS 10/25/2006  . DIVERTICULOSIS OF COLON 10/25/2006  . Osteoarthritis 10/25/2006  . CERVICAL SPINE  DISORDER, NOS 10/25/2006    Scot Jun, PTA 11/01/2020, 11:45 AM  [place]. [place], [place], [address] Phone: [phone]   Fax:  [phone]  Name: Karen Jackson MRN: [id number] Date of Birth: September 07, 1940

## 2020-11-01 NOTE — Therapy (Signed)
[place]. [place], [place], [address] Phone: [phone]   Fax:  [phone]  Occupational Therapy Treatment  Patient Details  Name: Karen Jackson MRN: [id number] Date of Birth: February 29, 1941 Referring Provider (OT): Madison Hickman   Encounter Date: 11/01/2020   OT End of Session - 11/01/20 1029    Visit Number 18    Number of Visits 19    Date for OT Re-Evaluation 11/05/20    Authorization Type Medicare    Progress Note Due on Visit 20    OT Start Time 1016    OT Stop Time 1059    OT Time Calculation (min) 43 min    Activity Tolerance Patient tolerated treatment well;Patient limited by pain    Behavior During Therapy [facility] for tasks assessed/performed           Past Medical History:  Diagnosis Date  . Acute cystitis without hematuria   . Acute diastolic CHF (congestive heart failure) ([place])   . Arthritis   . Dyspnea   . Dysrhythmia   . Fever of unknown origin 03/19/2017  . Hyperlipidemia   . Hypertension    denies at preop  . Multifocal pneumonia   . Neuromuscular disorder ([place])    neuropathy left arm and foot  . Osteopenia   . Paralysis ([place])    partial left side from CVA   . Persistent atrial fibrillation ([place])   . PONV (postoperative nausea and vomiting)   . Pre-diabetes   . Stroke [facility]) 2013   hemmorahgic    Past Surgical History:  Procedure Laterality Date  . ANKLE SURGERY    . APPENDECTOMY    . CHOLECYSTECTOMY    . HERNIA REPAIR     Esophagus  . JOINT REPLACEMENT     total- right partial- left  . MASTECTOMY PARTIAL / LUMPECTOMY  2012   left  . ORIF ANKLE FRACTURE Left 07/20/2018   Procedure: OPEN REDUCTION INTERNAL FIXATION (ORIF) ANKLE FRACTURE;  Surgeon: Wylene Simmer, MD;  Location: [facility];  Service: Orthopedics;  Laterality: Left;  . TOTAL KNEE ARTHROPLASTY Left 01/27/2019   Procedure: TOTAL KNEE ARTHROPLASTY;  Surgeon: Vickey Huger, MD;  Location[facility];  Service: Orthopedics;   Laterality: Left;    There were no vitals filed for this visit.   Subjective Assessment - 11/01/20 1025    Subjective  Pt stated that the pain in her L shoulder has improved while walking w/ the RW at home and that she is able to remember to "release the death grip" on the handle, which seems to help.    Pertinent History CVA with L hemiparesis (2013), OA, HTN, CHF, pre-diabetes    Limitations Pain in LUE, impaired sensation, decreased strength and coordination, decreased PROM/AROM    Patient Stated Goals Improve functional use of LUE, decrease pain    Currently in Pain? Yes    Pain Score 3     Pain Location Shoulder    Pain Orientation Left    Pain Descriptors / Indicators Aching    Pain Type Chronic pain    Pain Radiating Towards "My hand feels tingly"    Pain Onset More than a month ago    Pain Frequency Constant            OT Treatments/Exercises (OP) - 11/01/20 1032      ADLs   UB Dressing Max A doffing zip-up jacket      Elbow Exercises   Bar Weights/Barbell (Elbow Flexion)  1 lb   Bicep curls completed w/ 1lb weight. Pt c/o L shoulder pain when attempting supination of L forearm; able to complete 1 set of 15 reps w/ each UE     Wrist Exercises   Other wrist exercises Jux-a-Cisor arm maze completed 4x for coordination and ROM   Pt able to achieve slightly increased shoulder ROM w/out c/o pain in L shoulder during activity     Neurological Re-education Exercises   Other Exercises 1 Connect 4 activity completed w/ L hand to improve FM coordination and pinch/release pattern; OT graded activity throughout to facilitate functional pinch prehension      Modalities   Modalities Moist Heat   Pt completed elbow strengthening and part of Connect 4 game activity during moist heat therapy     Moist Heat Therapy   Number Minutes Moist Heat 15 Minutes    Moist Heat Location Shoulder      Splinting   Splinting OT donned LUE brace for shoulder support at start of session             OT Short Term Goals - 11/01/20 1037      OT SHORT TERM GOAL #1   Title Pt will don/doff zip-up jacket with Min A in at least 1/2 trials.    Baseline Mod A to don/doff zip-up jacket    Time 3    Period Weeks    Status On-going    Target Date 10/29/20      OT SHORT TERM GOAL #2   Title Per self-report, pt will report pain less than 3/10 in LUE when ambulating with RW at home at least 75% of the time.    Baseline Pt reports pain in LUE when ambulating with RW in the home.    Time 3    Period Weeks    Status Achieved   11/01/20 - pt reports no pain ambulating w/ RW at home when she remembers to relax her grip on the walker handle           OT Long Term Goals - 10/29/20 1210      OT LONG TERM GOAL #1   Title Pt will demonstrate/verbalize understanding of compensation and joint protection strategies to decrease pain and increase independence during ADLs.    Baseline Currently Mod A with dressing; participation in ADLs limited by pain    Time 6    Period Weeks    Status Achieved   10/25/20 - pt verbalized understanding of joint protection strategies and states comfort w/ instructing caregivers during ADLs     OT LONG TERM GOAL #2   Title Pt will improve functional FM coordination of as evidenced by ability to place >50% of pegs into pegboard with LUE.    Baseline Unable to place small pegs with LUE during eval.    Time 6    Period Weeks    Status Achieved   10/05/20 - pt able to place ~75% of pegs into pegboard without dropping     OT LONG TERM GOAL #3   Title Pt will be independent with HEP for BUE strengthening and Java to improve independence with functional tasks.    Baseline Pt reports no HEP for UE strength/coordination.    Time 4    Period Weeks    Status Revised    Target Date 11/26/20      OT LONG TERM GOAL #4   Title Pt will improve functional grasp of L hand as evidenced by increasing  grip strength by >9 lbs.    Baseline LUE = 11 lbs; RUE = 33 lbs    Time 6     Period Weeks    Status Achieved   10/18/20 - LUE = 21 lbs; RUE = 36 lbs     OT LONG TERM GOAL #5   Title Pt will be able to deal standard-sized playing cards one-by-one using bilateral thumbs independently at least 80% of the time    Baseline Unable to deal cards using isolated thumb    Time 6    Period Weeks    Status On-going            Plan - 11/01/20 1601    Clinical Impression Statement Decreased report of pain in pt's L shoulder/arm this session facilitated increased ROM and forward reach during therapeutic activities. FM coordination continues to be limiting, but pt demo's more control w/ improved positioning, cues for pacing, and visual attention to task.    OT Occupational Profile and History Problem Focused Assessment - Including review of records relating to presenting problem    Occupational performance deficits (Please refer to evaluation for details): ADL's;IADL's    Body Structure / Function / Physical Skills ADL;UE functional use;Balance;Body mechanics;Pain;FMC;Proprioception;ROM;Coordination;GMC;Sensation;Strength;Dexterity    Rehab Potential Fair    Clinical Decision Making Several treatment options, min-mod task modification necessary    Comorbidities Affecting Occupational Performance: May have comorbidities impacting occupational performance    Modification or Assistance to Complete Evaluation  Min-Moderate modification of tasks or assist with assess necessary to complete eval    OT Frequency 2x / week    OT Duration 6 weeks    OT Treatment/Interventions Self-care/ADL training;Ultrasound;Compression bandaging;DME and/or AE instruction;Patient/family education;Passive range of motion;Electrical Stimulation;Splinting;Moist Heat;Therapeutic exercise;Manual Therapy;Therapeutic activities;Neuromuscular education;Energy conservation;Iontophoresis;Cryotherapy;Functional Mobility Training;Aquatic Therapy    Plan continue with PoC    Consulted and Agree with Plan of Care Patient            Patient will benefit from skilled therapeutic intervention in order to improve the following deficits and impairments:   Body Structure / Function / Physical Skills: ADL,UE functional use,Balance,Body mechanics,Pain,FMC,Proprioception,ROM,Coordination,GMC,Sensation,Strength,Dexterity       Visit Diagnosis: Ataxia  Hemiplegia and hemiparesis following cerebral infarction affecting left non-dominant side (HCC)  Other lack of coordination  Muscle weakness (generalized)    Problem List Patient Active Problem List   Diagnosis Date Noted  . Orthostatic hypotension 10/15/2020  . Presbycusis of both ears 03/08/2020  . Mixed stress and urge urinary incontinence 12/02/2019  . Macrocytosis 12/01/2019  . Nutritional anemia 12/01/2019  . S/P total knee replacement 01/27/2019  . Recurrent left knee instability 07/05/2018  . Respiratory failure with hypoxia ([place]) 08/30/2017  . Hypoxemia   . Heart failure with preserved ejection fraction ([place]), Grade 3 diastolic dysfunction 63/87/5643  . PAF (paroxysmal atrial fibrillation) ([place])   . Dyspnea 03/19/2017  . Encounter for preventive health examination 02/17/2016  . Sensorineural hearing loss (SNHL), bilateral 01/26/2016  . Morbid obesity ([place]) 06/17/2015  . Hypomagnesemia 04/24/2014  . Hemiparesis affecting left side as late effect of cerebrovascular accident ([place]) 04/24/2014  . Nontraumatic cerebral hemorrhage ([place]) 04/30/2012  . DM (diabetes mellitus) with complications ([place]) 32/95/1884  . OSTEOPENIA 01/21/2009  . UNSPECIFIED VITAMIN D DEFICIENCY 11/19/2007  . HYPERCHOLESTEROLEMIA 10/25/2006  . GASTROESOPHAGEAL REFLUX, NO ESOPHAGITIS 10/25/2006  . DIVERTICULOSIS OF COLON 10/25/2006  . Osteoarthritis 10/25/2006  . CERVICAL SPINE DISORDER, NOS 10/25/2006     Kathrine Cords, OTR/L, MSOT 11/01/2020, 4:30 PM  [facility]  [place]. [place], [place], [address] Phone:  [phone]   Fax:  [phone]  Name: Karen Jackson MRN: [id number] Date of Birth: [birth year]

## 2020-11-04 ENCOUNTER — Encounter: Payer: Self-pay | Admitting: Occupational Therapy

## 2020-11-04 ENCOUNTER — Ambulatory Visit: Payer: Medicare Other | Admitting: Occupational Therapy

## 2020-11-04 ENCOUNTER — Other Ambulatory Visit: Payer: Self-pay

## 2020-11-04 ENCOUNTER — Ambulatory Visit: Payer: Medicare Other | Admitting: Physical Therapy

## 2020-11-04 DIAGNOSIS — M25512 Pain in left shoulder: Secondary | ICD-10-CM | POA: Diagnosis not present

## 2020-11-04 DIAGNOSIS — R278 Other lack of coordination: Secondary | ICD-10-CM

## 2020-11-04 DIAGNOSIS — I69354 Hemiplegia and hemiparesis following cerebral infarction affecting left non-dominant side: Secondary | ICD-10-CM

## 2020-11-04 DIAGNOSIS — M6281 Muscle weakness (generalized): Secondary | ICD-10-CM

## 2020-11-04 DIAGNOSIS — R270 Ataxia, unspecified: Secondary | ICD-10-CM | POA: Diagnosis not present

## 2020-11-04 DIAGNOSIS — R262 Difficulty in walking, not elsewhere classified: Secondary | ICD-10-CM | POA: Diagnosis not present

## 2020-11-04 DIAGNOSIS — I6990 Unspecified sequelae of unspecified cerebrovascular disease: Secondary | ICD-10-CM | POA: Diagnosis not present

## 2020-11-04 NOTE — Therapy (Signed)
[place]. [place], [place], [address] Phone: [phone]   Fax:  [phone]  Physical Therapy Treatment  Patient Details  Name: Karen Jackson MRN: [id number] Date of Birth: Jun 06, 1941 Referring Provider (PT): [place]   Encounter Date: 11/04/2020   PT End of Session - 11/04/20 1129    Visit Number 38    Date for PT Re-Evaluation 11/16/20    PT Start Time 1100    PT Stop Time 1145    PT Time Calculation (min) 45 min           Past Medical History:  Diagnosis Date  . Acute cystitis without hematuria   . Acute diastolic CHF (congestive heart failure) ([place])   . Arthritis   . Dyspnea   . Dysrhythmia   . Fever of unknown origin 03/19/2017  . Hyperlipidemia   . Hypertension    denies at preop  . Multifocal pneumonia   . Neuromuscular disorder ([place])    neuropathy left arm and foot  . Osteopenia   . Paralysis ([place])    partial left side from CVA   . Persistent atrial fibrillation ([place])   . PONV (postoperative nausea and vomiting)   . Pre-diabetes   . Stroke [facility]) 2013   hemmorahgic    Past Surgical History:  Procedure Laterality Date  . ANKLE SURGERY    . APPENDECTOMY    . CHOLECYSTECTOMY    . HERNIA REPAIR     Esophagus  . JOINT REPLACEMENT     total- right partial- left  . MASTECTOMY PARTIAL / LUMPECTOMY  2012   left  . ORIF ANKLE FRACTURE Left 07/20/2018   Procedure: OPEN REDUCTION INTERNAL FIXATION (ORIF) ANKLE FRACTURE;  Surgeon: Wylene Simmer, MD;  Location: [facility];  Service: Orthopedics;  Laterality: Left;  . TOTAL KNEE ARTHROPLASTY Left 01/27/2019   Procedure: TOTAL KNEE ARTHROPLASTY;  Surgeon: Vickey Huger, MD;  [facility];  Service: Orthopedics;  Laterality: Left;    There were no vitals filed for this visit.   Subjective Assessment - 11/04/20 1058    Subjective all good    Currently in Pain? Yes    Pain Score 5     Pain Location Shoulder                              [facility] Adult PT Treatment/Exercise - 11/04/20 1108      Ambulation/Gait   Gait Comments amb wih FWW 125 feet CGA, 2nd walk 100 feet cued to look up but much less stability if looks up. SOB with walking 2nd time after ex      Knee/Hip Exercises: Aerobic   Nustep L 5 500 steps 6 min 40 sec      Knee/Hip Exercises: Standing   Other Standing Knee Exercises 3# standing march,hip flex,ext and abd 10x   min A for balance when on LLE and cues to slow speed. standing on LLE and abd and ext with  RT was very difficult for pt     Knee/Hip Exercises: Seated   Sit to Sand with UE support   3x with BIL UE on chair to push up cuing and CG-min A needed                   PT Short Term Goals - 06/18/20 1105      PT SHORT TERM GOAL #1   Title independent with initial HEP  Status Partially Met             PT Long Term Goals - 11/04/20 1128      PT LONG TERM GOAL #1   Title walk with hand hold assist x 100 feet    Status Partially Met      PT LONG TERM GOAL #2   Title increased right LE strength to 4-/5    Status Partially Met      PT LONG TERM GOAL #3   Title walk 100 feet with SBA using the FWW    Baseline vaies with fatigue, SOB and if looks up unsteady    Status Partially Met      PT LONG TERM GOAL #4   Title transfer independently with set up    Status Partially Met                 Plan - 11/04/20 1130    Clinical Impression Statement added standing ex today and pt needed min A when standing on Left LE and moving RT leg. fatigued with increased gait and SOB, when cued to look up decreased balance. progressing towards LTGs    PT Next Visit Plan work on progressing her function and independence           Patient will benefit from skilled therapeutic intervention in order to improve the following deficits and impairments:  Abnormal gait,Cardiopulmonary status limiting activity,Decreased activity tolerance,Decreased  balance,Decreased mobility,Decreased strength,Decreased coordination,Decreased range of motion,Difficulty walking,Increased muscle spasms,Pain  Visit Diagnosis: Other lack of coordination  Muscle weakness (generalized)  Difficulty in walking, not elsewhere classified     Problem List Patient Active Problem List   Diagnosis Date Noted  . Orthostatic hypotension 10/15/2020  . Presbycusis of both ears 03/08/2020  . Mixed stress and urge urinary incontinence 12/02/2019  . Macrocytosis 12/01/2019  . Nutritional anemia 12/01/2019  . S/P total knee replacement 01/27/2019  . Recurrent left knee instability 07/05/2018  . Respiratory failure with hypoxia ([place]) 08/30/2017  . Hypoxemia   . Heart failure with preserved ejection fraction ([place]), Grade 3 diastolic dysfunction 58/30/9407  . PAF (paroxysmal atrial fibrillation) ([place])   . Dyspnea 03/19/2017  . Encounter for preventive health examination 02/17/2016  . Sensorineural hearing loss (SNHL), bilateral 01/26/2016  . Morbid obesity ([place]) 06/17/2015  . Hypomagnesemia 04/24/2014  . Hemiparesis affecting left side as late effect of cerebrovascular accident ([place]) 04/24/2014  . Nontraumatic cerebral hemorrhage ([place]) 04/30/2012  . DM (diabetes mellitus) with complications ([place]) 68/03/8109  . OSTEOPENIA 01/21/2009  . UNSPECIFIED VITAMIN D DEFICIENCY 11/19/2007  . HYPERCHOLESTEROLEMIA 10/25/2006  . GASTROESOPHAGEAL REFLUX, NO ESOPHAGITIS 10/25/2006  . DIVERTICULOSIS OF COLON 10/25/2006  . Osteoarthritis 10/25/2006  . CERVICAL SPINE DISORDER, NOS 10/25/2006    ,ANGIE PTA 11/04/2020, 11:38 AM  [place]. [place], [place], [address] Phone: [phone]   Fax:  [phone]  Name: Karen Jackson MRN: [id number] Date of Birth: 05/01/1941

## 2020-11-05 NOTE — Addendum Note (Signed)
Addended by: Kathrine Cords on: 11/05/2020 02:35 PM   Modules accepted: Orders

## 2020-11-05 NOTE — Therapy (Signed)
[place] [place], [place], [address] Phone: [phone]   Fax:  [phone]  Occupational Therapy Treatment  Patient Details  Name: Karen Jackson MRN: [id number] Date of Birth: Apr 19, 1941 Referring Provider (OT): Madison Hickman   Encounter Date: 11/04/2020   OT End of Session - 11/04/20 1042    Visit Number 19    Number of Visits 19    Date for OT Re-Evaluation 11/05/20    Authorization Type Medicare    Progress Note Due on Visit 70    OT Start Time 1023   Pt arrived late   OT Stop Time 1100    OT Time Calculation (min) 37 min    Activity Tolerance Patient tolerated treatment well;Patient limited by pain    Behavior During Therapy [facility] for tasks assessed/performed           Past Medical History:  Diagnosis Date  . Acute cystitis without hematuria   . Acute diastolic CHF (congestive heart failure) ([place])   . Arthritis   . Dyspnea   . Dysrhythmia   . Fever of unknown origin 03/19/2017  . Hyperlipidemia   . Hypertension    denies at preop  . Multifocal pneumonia   . Neuromuscular disorder ([place])    neuropathy left arm and foot  . Osteopenia   . Paralysis ([place])    partial left side from CVA   . Persistent atrial fibrillation ([place])   . PONV (postoperative nausea and vomiting)   . Pre-diabetes   . Stroke [facility]) 2013   hemmorahgic    Past Surgical History:  Procedure Laterality Date  . ANKLE SURGERY    . APPENDECTOMY    . CHOLECYSTECTOMY    . HERNIA REPAIR     Esophagus  . JOINT REPLACEMENT     total- right partial- left  . MASTECTOMY PARTIAL / LUMPECTOMY  2012   left  . ORIF ANKLE FRACTURE Left 07/20/2018   Procedure: OPEN REDUCTION INTERNAL FIXATION (ORIF) ANKLE FRACTURE;  Surgeon: Wylene Simmer, MD;  Location: [facility];  Service: Orthopedics;  Laterality: Left;  . TOTAL KNEE ARTHROPLASTY Left 01/27/2019   Procedure: TOTAL KNEE ARTHROPLASTY;  Surgeon: Vickey Huger, MD;  Location[facility];  Service:  Orthopedics;  Laterality: Left;    There were no vitals filed for this visit.   Subjective Assessment - 11/04/20 1030    Subjective  Pt affirmed she is comfortable decreasing OT frequency to 1x/week and wants to try completing her HEP at home more frequently.    Pertinent History CVA with L hemiparesis (2013), OA, HTN, CHF, pre-diabetes    Limitations Pain in LUE, impaired sensation, decreased strength and coordination, decreased PROM/AROM    Patient Stated Goals Improve functional use of LUE, decrease pain    Currently in Pain? Yes    Pain Score 3     Pain Location Shoulder    Pain Orientation Left    Pain Descriptors / Indicators Aching;Pins and needles    Pain Type Chronic pain    Pain Onset More than a month ago    Pain Frequency Constant            OT Treatments/Exercises (OP) - 11/04/20 1052      ADLs   UB Dressing Max A doffing zip-up jacket      Shoulder Exercises: Seated   Row AROM;Left;10 reps   Gentle chest press w/ elbow flexed to 90 degrees at side; 2 sets of 10 reps; pt demo'd  increased ROM within pain-free range this session compared w/ previous sessions   Abduction AROM;Left;10 reps   Gentle AROM w/ elbow flexed to 90 degrees at side; 2 sets of 10 reps     Elbow Exercises   Other elbow exercises Bicep curls 10x w/ no added resistance   OT support at L shoulder alleviated pain w/ repetition     Modalities   Modalities Moist Heat   Completed marble activity during moist heat therapy     Moist Heat Therapy   Number Minutes Moist Heat 15 Minutes    Moist Heat Location Shoulder      Fine Motor Coordination (Hand/Wrist)   Dealing card with thumb Using both thumbs to slide cards off of deck one at a time 10x2   Pt demo'd improved ability to isolate L thumb this session         Therapeutic Activities: 1) Pt used isolated index finger to roll marbles into large pegboard holes; completed 20x w/ pt able to effectively control 8/20 marbles on 1st attempt. Pt  demo'd increased control w/ fingers extended compared to flexed into fist.  2) Pt instructed to pick up marbles w/ fingertip pads and thumb and place them onto large pegs secured into large pegboard; completed 10x w/ multiple attempts per marble. Due to decreased accuracy, OT graded activity down to use oversized marbles with positive results.    OT Short Term Goals - 11/04/20 1351      OT SHORT TERM GOAL #1   Title Pt will doff open-front jacket with Min A at least 75% of the time    Baseline Mod A to doff zip-up jacket    Time 4    Period Weeks    Status Revised    Target Date 12/02/20      OT SHORT TERM GOAL #2   Title Pt will thread LUE into open-front jacket w/ Mod I in at least 1/2 trials    Baseline Max A to don zip-up jacket    Time 4    Period Weeks    Status New   11/01/20 - pt reports no pain ambulating w/ RW at home when she remembers to relax her grip on the walker handle     OT SHORT TERM GOAL #3   Title Per self-report, pt will report pain less than 3/10 in LUE when ambulating with RW at home at least 75% of the time.    Baseline Pt reports pain in LUE when ambulating with RW in the home.    Time 3    Period Weeks    Status Achieved            OT Long Term Goals - 10/29/20 1210      OT LONG TERM GOAL #1   Title Pt will demonstrate/verbalize understanding of compensation and joint protection strategies to decrease pain and increase independence during ADLs.    Baseline Currently Mod A with dressing; participation in ADLs limited by pain    Time 6    Period Weeks    Status Achieved   10/25/20 - pt verbalized understanding of joint protection strategies and states comfort w/ instructing caregivers during ADLs     OT LONG TERM GOAL #2   Title Pt will improve functional FM coordination of as evidenced by ability to place >50% of pegs into pegboard with LUE.    Baseline Unable to place small pegs with LUE during eval.    Time 6  Period Weeks    Status Achieved    10/05/20 - pt able to place ~75% of pegs into pegboard without dropping     OT LONG TERM GOAL #3   Title Pt will be independent with HEP for BUE strengthening and [place] to improve independence with functional tasks.    Baseline Pt reports no HEP for UE strength/coordination.    Time 4    Period Weeks    Status Revised    Target Date 11/26/20      OT LONG TERM GOAL #4   Title Pt will improve functional grasp of L hand as evidenced by increasing grip strength by >9 lbs.    Baseline LUE = 11 lbs; RUE = 33 lbs    Time 6    Period Weeks    Status Achieved   10/18/20 - LUE = 21 lbs; RUE = 36 lbs     OT LONG TERM GOAL #5   Title Pt will be able to deal standard-sized playing cards one-by-one using bilateral thumbs independently at least 80% of the time    Baseline Unable to deal cards using isolated thumb    Time 6    Period Weeks    Status On-going            Plan - 11/04/20 1346    Clinical Impression Statement OT instructed pt to laterally weight shift away from L side during L shoulder AROM exercises to decrease pain with positive results; pt was able to complete flexion/extension and abduction/adduction of LUE w/out pain compared w/ previous sessions. Ataxia and decreased [place] continue to be limiting, but pt has shown improvement during functional tasks.    OT Occupational Profile and History Problem Focused Assessment - Including review of records relating to presenting problem    Occupational performance deficits (Please refer to evaluation for details): ADL's;IADL's    Body Structure / Function / Physical Skills ADL;UE functional use;Balance;Body mechanics;Pain;FMC;Proprioception;ROM;Coordination;GMC;Sensation;Strength;Dexterity    Rehab Potential Fair    Clinical Decision Making Several treatment options, min-mod task modification necessary    Comorbidities Affecting Occupational Performance: May have comorbidities impacting occupational performance    Modification or Assistance to  Complete Evaluation  Min-Moderate modification of tasks or assist with assess necessary to complete eval    OT Frequency 2x / week    OT Duration 6 weeks    OT Treatment/Interventions Self-care/ADL training;Ultrasound;Compression bandaging;DME and/or AE instruction;Patient/family education;Passive range of motion;Electrical Stimulation;Splinting;Moist Heat;Therapeutic exercise;Manual Therapy;Therapeutic activities;Neuromuscular education;Energy conservation;Iontophoresis;Cryotherapy;Functional Mobility Training;Aquatic Therapy    Plan Core strengthening and weight-shifting exercises; UB dressing    Consulted and Agree with Plan of Care Patient           Patient will benefit from skilled therapeutic intervention in order to improve the following deficits and impairments:   Body Structure / Function / Physical Skills: ADL,UE functional use,Balance,Body mechanics,Pain,FMC,Proprioception,ROM,Coordination,GMC,Sensation,Strength,Dexterity       Visit Diagnosis: Ataxia  Hemiplegia and hemiparesis following cerebral infarction affecting left non-dominant side (HCC)  Muscle weakness (generalized)  Other lack of coordination    Problem List Patient Active Problem List   Diagnosis Date Noted  . Orthostatic hypotension 10/15/2020  . Presbycusis of both ears 03/08/2020  . Mixed stress and urge urinary incontinence 12/02/2019  . Macrocytosis 12/01/2019  . Nutritional anemia 12/01/2019  . S/P total knee replacement 01/27/2019  . Recurrent left knee instability 07/05/2018  . Respiratory failure with hypoxia ([place]) 08/30/2017  . Hypoxemia   . Heart failure with preserved ejection fraction (HCC), Grade 3  diastolic dysfunction 28/83/3744  . PAF (paroxysmal atrial fibrillation) ([place])   . Dyspnea 03/19/2017  . Encounter for preventive health examination 02/17/2016  . Sensorineural hearing loss (SNHL), bilateral 01/26/2016  . Morbid obesity ([place]) 06/17/2015  . Hypomagnesemia 04/24/2014  .  Hemiparesis affecting left side as late effect of cerebrovascular accident ([place]) 04/24/2014  . Nontraumatic cerebral hemorrhage ([place]) 04/30/2012  . DM (diabetes mellitus) with complications ([place]) 51/46/0479  . OSTEOPENIA 01/21/2009  . UNSPECIFIED VITAMIN D DEFICIENCY 11/19/2007  . HYPERCHOLESTEROLEMIA 10/25/2006  . GASTROESOPHAGEAL REFLUX, NO ESOPHAGITIS 10/25/2006  . DIVERTICULOSIS OF COLON 10/25/2006  . Osteoarthritis 10/25/2006  . CERVICAL SPINE DISORDER, NOS 10/25/2006     Kathrine Cords, OTR/L, MSOT 11/05/2020, 2:17 PM  [place]. [place], [place], [address] Phone: [phone]   Fax:  [phone]  Name: Karen Jackson MRN: [id number] Date of Birth: 1941-01-16

## 2020-11-08 ENCOUNTER — Ambulatory Visit: Payer: Medicare Other | Admitting: Occupational Therapy

## 2020-11-08 ENCOUNTER — Encounter: Payer: Self-pay | Admitting: Physical Therapy

## 2020-11-08 ENCOUNTER — Other Ambulatory Visit: Payer: Self-pay

## 2020-11-08 ENCOUNTER — Ambulatory Visit: Payer: Medicare Other | Admitting: Physical Therapy

## 2020-11-08 DIAGNOSIS — M6281 Muscle weakness (generalized): Secondary | ICD-10-CM | POA: Diagnosis not present

## 2020-11-08 DIAGNOSIS — R262 Difficulty in walking, not elsewhere classified: Secondary | ICD-10-CM | POA: Diagnosis not present

## 2020-11-08 DIAGNOSIS — I69354 Hemiplegia and hemiparesis following cerebral infarction affecting left non-dominant side: Secondary | ICD-10-CM

## 2020-11-08 DIAGNOSIS — R270 Ataxia, unspecified: Secondary | ICD-10-CM

## 2020-11-08 DIAGNOSIS — I6990 Unspecified sequelae of unspecified cerebrovascular disease: Secondary | ICD-10-CM | POA: Diagnosis not present

## 2020-11-08 DIAGNOSIS — M25512 Pain in left shoulder: Secondary | ICD-10-CM | POA: Diagnosis not present

## 2020-11-08 NOTE — Therapy (Signed)
[place]. [place], [place], [address] Phone: [phone]   Fax:  [phone]  Physical Therapy Treatment  Patient Details  Name: Karen Jackson MRN: [id number] Date of Birth: 1940/11/26 Referring Provider (PT): [place]   Encounter Date: 11/08/2020   PT End of Session - 11/08/20 1146    Visit Number 39    Date for PT Re-Evaluation 11/16/20    PT Start Time 1100    PT Stop Time 1145    PT Time Calculation (min) 45 min    Activity Tolerance Patient tolerated treatment well    Behavior During Therapy [facility] for tasks assessed/performed           Past Medical History:  Diagnosis Date  . Acute cystitis without hematuria   . Acute diastolic CHF (congestive heart failure) ([place])   . Arthritis   . Dyspnea   . Dysrhythmia   . Fever of unknown origin 03/19/2017  . Hyperlipidemia   . Hypertension    denies at preop  . Multifocal pneumonia   . Neuromuscular disorder ([place])    neuropathy left arm and foot  . Osteopenia   . Paralysis ([place])    partial left side from CVA   . Persistent atrial fibrillation ([place])   . PONV (postoperative nausea and vomiting)   . Pre-diabetes   . Stroke [facility]) 2013   hemmorahgic    Past Surgical History:  Procedure Laterality Date  . ANKLE SURGERY    . APPENDECTOMY    . CHOLECYSTECTOMY    . HERNIA REPAIR     Esophagus  . JOINT REPLACEMENT     total- right partial- left  . MASTECTOMY PARTIAL / LUMPECTOMY  2012   left  . ORIF ANKLE FRACTURE Left 07/20/2018   Procedure: OPEN REDUCTION INTERNAL FIXATION (ORIF) ANKLE FRACTURE;  Surgeon: Wylene Simmer, MD;  Location: [facility];  Service: Orthopedics;  Laterality: Left;  . TOTAL KNEE ARTHROPLASTY Left 01/27/2019   Procedure: TOTAL KNEE ARTHROPLASTY;  Surgeon: Vickey Huger, MD;  [facility];  Service: Orthopedics;  Laterality: Left;    There were no vitals filed for this visit.   Subjective Assessment - 11/08/20 1108    Subjective  Except for my shoulder Im ok.    Currently in Pain? Yes    Pain Score 4     Pain Location Shoulder    Pain Orientation Left                             [facility] Adult PT Treatment/Exercise - 11/08/20 0001      Ambulation/Gait   Gait Comments amd 154f with RW, secont trial 1440f     Knee/Hip Exercises: Aerobic   Nustep L 5 500 steps 6 min 58 sec      Knee/Hip Exercises: Standing   Other Standing Knee Exercises Standing marches      Knee/Hip Exercises: Seated   Long Arc Quad Both;3 sets;10 reps    Long Arc Quad Weight 5 lbs.    Hamstring Curl Both;3 sets;10 reps    Hamstring Limitations blue tband cues for ROM    Sit to Sand with UE support   2x3 bilat UE on chair                   PT Short Term Goals - 06/18/20 1105      PT SHORT TERM GOAL #1   Title independent with  initial HEP    Status Partially Met             PT Long Term Goals - 11/04/20 1128      PT LONG TERM GOAL #1   Title walk with hand hold assist x 100 feet    Status Partially Met      PT LONG TERM GOAL #2   Title increased right LE strength to 4-/5    Status Partially Met      PT LONG TERM GOAL #3   Title walk 100 feet with SBA using the FWW    Baseline vaies with fatigue, SOB and if looks up unsteady    Status Partially Met      PT LONG TERM GOAL #4   Title transfer independently with set up    Status Partially Met                 Plan - 11/08/20 1146    Clinical Impression Statement Good carryover form last session with sit to stands. increase single trial gait distance without rest. Cues to hold muscle contraction with LAQ needed. Some instability noted with standing marches.    Stability/Clinical Decision Making Stable/Uncomplicated    Rehab Potential Good    PT Frequency 2x / week    PT Duration 4 weeks    PT Treatment/Interventions ADLs/Self Care Home Management;Cryotherapy;Electrical Stimulation;Moist Heat;Gait training;Functional mobility  training;Therapeutic activities;Therapeutic exercise;Balance training;Neuromuscular re-education;Patient/family education;Manual techniques    PT Next Visit Plan work on progressing her function and independence           Patient will benefit from skilled therapeutic intervention in order to improve the following deficits and impairments:  Abnormal gait,Cardiopulmonary status limiting activity,Decreased activity tolerance,Decreased balance,Decreased mobility,Decreased strength,Decreased coordination,Decreased range of motion,Difficulty walking,Increased muscle spasms,Pain  Visit Diagnosis: Hemiplegia and hemiparesis following cerebral infarction affecting left non-dominant side (HCC)  Muscle weakness (generalized)  Difficulty in walking, not elsewhere classified  Ataxia     Problem List Patient Active Problem List   Diagnosis Date Noted  . Orthostatic hypotension 10/15/2020  . Presbycusis of both ears 03/08/2020  . Mixed stress and urge urinary incontinence 12/02/2019  . Macrocytosis 12/01/2019  . Nutritional anemia 12/01/2019  . S/P total knee replacement 01/27/2019  . Recurrent left knee instability 07/05/2018  . Respiratory failure with hypoxia ([place]) 08/30/2017  . Hypoxemia   . Heart failure with preserved ejection fraction ([place]), Grade 3 diastolic dysfunction 83/15/1761  . PAF (paroxysmal atrial fibrillation) ([place])   . Dyspnea 03/19/2017  . Encounter for preventive health examination 02/17/2016  . Sensorineural hearing loss (SNHL), bilateral 01/26/2016  . Morbid obesity ([place]) 06/17/2015  . Hypomagnesemia 04/24/2014  . Hemiparesis affecting left side as late effect of cerebrovascular accident ([place]) 04/24/2014  . Nontraumatic cerebral hemorrhage ([place]) 04/30/2012  . DM (diabetes mellitus) with complications ([place]) 60/73/7106  . OSTEOPENIA 01/21/2009  . UNSPECIFIED VITAMIN D DEFICIENCY 11/19/2007  . HYPERCHOLESTEROLEMIA 10/25/2006  . GASTROESOPHAGEAL REFLUX, NO  ESOPHAGITIS 10/25/2006  . DIVERTICULOSIS OF COLON 10/25/2006  . Osteoarthritis 10/25/2006  . CERVICAL SPINE DISORDER, NOS 10/25/2006    Scot Jun, PTA 11/08/2020, 11:48 AM  [place]. [place], [place], [address] Phone: [phone]   Fax:  [phone]  Name: DEBHORA TITUS MRN: [id number] Date of Birth: 08/26/1941

## 2020-11-10 ENCOUNTER — Telehealth: Payer: Self-pay

## 2020-11-10 MED ORDER — LIDOCAINE 5 % EX PTCH: 1.0000 | MEDICATED_PATCH | CUTANEOUS | 2 refills | Status: DC

## 2020-11-10 NOTE — Telephone Encounter (Signed)
Called and informed that I will send in patch Rx

## 2020-11-10 NOTE — Telephone Encounter (Signed)
Patient calls nurse line requesting lidoderm patches for shoulder pain. Patient reports that she used these in the past and they helped with her pain. These are not on current medication list.   Please advise.   Talbot Grumbling, RN

## 2020-11-11 ENCOUNTER — Other Ambulatory Visit: Payer: Self-pay

## 2020-11-11 ENCOUNTER — Ambulatory Visit: Payer: Medicare Other | Admitting: Occupational Therapy

## 2020-11-11 ENCOUNTER — Encounter: Payer: Self-pay | Admitting: Occupational Therapy

## 2020-11-11 ENCOUNTER — Ambulatory Visit: Payer: Medicare Other | Admitting: Physical Therapy

## 2020-11-11 DIAGNOSIS — R270 Ataxia, unspecified: Secondary | ICD-10-CM | POA: Diagnosis not present

## 2020-11-11 DIAGNOSIS — R262 Difficulty in walking, not elsewhere classified: Secondary | ICD-10-CM | POA: Diagnosis not present

## 2020-11-11 DIAGNOSIS — M6281 Muscle weakness (generalized): Secondary | ICD-10-CM

## 2020-11-11 DIAGNOSIS — I69354 Hemiplegia and hemiparesis following cerebral infarction affecting left non-dominant side: Secondary | ICD-10-CM | POA: Diagnosis not present

## 2020-11-11 DIAGNOSIS — I6990 Unspecified sequelae of unspecified cerebrovascular disease: Secondary | ICD-10-CM | POA: Diagnosis not present

## 2020-11-11 DIAGNOSIS — M25512 Pain in left shoulder: Secondary | ICD-10-CM | POA: Diagnosis not present

## 2020-11-11 DIAGNOSIS — R278 Other lack of coordination: Secondary | ICD-10-CM

## 2020-11-11 NOTE — Therapy (Signed)
[place]. [place], [place], [address] Phone: [phone]   Fax:  [phone]  Occupational Therapy Treatment & Progress Note  Patient Details  Name: Karen Jackson MRN: [id number] Date of Birth: 1940-12-12 Referring Provider (OT): Madison Hickman   Encounter Date: 11/11/2020   OT End of Session - 11/11/20 1040    Visit Number 20    Number of Visits 25    Date for OT Re-Evaluation 12/07/20    Authorization Type Medicare    Progress Note Due on Visit 27    OT Start Time 1018   pt arrived late   OT Stop Time 1100    OT Time Calculation (min) 42 min    Activity Tolerance Patient tolerated treatment well;Patient limited by pain    Behavior During Therapy [facility] for tasks assessed/performed           Past Medical History:  Diagnosis Date  . Acute cystitis without hematuria   . Acute diastolic CHF (congestive heart failure) ([place])   . Arthritis   . Dyspnea   . Dysrhythmia   . Fever of unknown origin 03/19/2017  . Hyperlipidemia   . Hypertension    denies at preop  . Multifocal pneumonia   . Neuromuscular disorder ([place])    neuropathy left arm and foot  . Osteopenia   . Paralysis ([place])    partial left side from CVA   . Persistent atrial fibrillation ([place])   . PONV (postoperative nausea and vomiting)   . Pre-diabetes   . Stroke [facility]) 2013   hemmorahgic    Past Surgical History:  Procedure Laterality Date  . ANKLE SURGERY    . APPENDECTOMY    . CHOLECYSTECTOMY    . HERNIA REPAIR     Esophagus  . JOINT REPLACEMENT     total- right partial- left  . MASTECTOMY PARTIAL / LUMPECTOMY  2012   left  . ORIF ANKLE FRACTURE Left 07/20/2018   Procedure: OPEN REDUCTION INTERNAL FIXATION (ORIF) ANKLE FRACTURE;  Surgeon: Wylene Simmer, MD;  Location: [facility];  Service: Orthopedics;  Laterality: Left;  . TOTAL KNEE ARTHROPLASTY Left 01/27/2019   Procedure: TOTAL KNEE ARTHROPLASTY;  Surgeon: Vickey Huger, MD;  [facility];  Service: Orthopedics;  Laterality: Left;    There were no vitals filed for this visit.   Subjective Assessment - 11/11/20 1023    Subjective  Pt arrived in new transport chair and stated that, "it is much more comfortable than I was expecting and my caregivers definitely like it"    Pertinent History CVA with L hemiparesis (2013), OA, HTN, CHF, pre-diabetes    Limitations Pain in LUE, impaired sensation, decreased strength and coordination, decreased PROM/AROM    Patient Stated Goals Improve functional use of LUE, decrease pain    Currently in Pain? Yes    Pain Score 3     Pain Location Shoulder    Pain Orientation Left    Pain Descriptors / Indicators Aching;Pins and needles    Pain Type Chronic pain    Pain Onset More than a month ago    Pain Frequency Constant            OT Treatments/Exercises (OP) - 11/11/20 1043      Cognitive Exercises   Simple Puzzle Easy and medium-level mazes completed w/ 1-2 verbal cues each for success      Hand Exercises   Other Hand Exercises Graded resistance clothespins used to improve [facility]  and strength   Level 1 (yellow), 2 (red), and 3 (green); OT provided cues to slow pt down which improved accuracy     Visual/Perceptual Exercises   Word Finding Completed simple word search; pt required extra time and demonstrated some difficulty w/ visual scanning   Attempted crossing out words in word search w/ L hand; pt unable to target accurately due to ataxia and OT graded activity down to use R (dominant) hand   Visual Motor Integration Pt completed targeting exercise w/ L hand requiring pt to make small marks in various sized targets on whiteboard   Accuracy improved w/ proximal support, built-up handle, and w/ cues to decrease speed     Modalities   Modalities Moist Heat   Completed mazes and word search activities during moist heat therapy     Moist Heat Therapy   Number Minutes Moist Heat 15 Minutes    Moist Heat Location Shoulder             OT Short Term Goals - 11/04/20 1351      OT SHORT TERM GOAL #1   Title Pt will doff open-front jacket with Min A at least 75% of the time    Baseline Mod A to doff zip-up jacket    Time 4    Period Weeks    Status Revised    Target Date 12/02/20      OT SHORT TERM GOAL #2   Title Pt will thread LUE into open-front jacket w/ Mod I in at least 1/2 trials    Baseline Max A to don zip-up jacket    Time 4    Period Weeks    Status New   11/01/20 - pt reports no pain ambulating w/ RW at home when she remembers to relax her grip on the walker handle     OT SHORT TERM GOAL #3   Title Per self-report, pt will report pain less than 3/10 in LUE when ambulating with RW at home at least 75% of the time.    Baseline Pt reports pain in LUE when ambulating with RW in the home.    Time 3    Period Weeks    Status Achieved            OT Long Term Goals - 11/04/20 1432      OT LONG TERM GOAL #1   Title Pt will demonstrate/verbalize understanding of compensation and joint protection strategies to decrease pain and increase independence during ADLs.    Baseline Currently Mod A with dressing; participation in ADLs limited by pain    Time 6    Period Weeks    Status Achieved   10/25/20 - pt verbalized understanding of joint protection strategies and states comfort w/ instructing caregivers during ADLs     OT LONG TERM GOAL #2   Title Pt will improve functional FM coordination of as evidenced by ability to place >50% of pegs into pegboard with LUE.    Baseline Unable to place small pegs with LUE during eval.    Time 6    Period Weeks    Status Achieved   10/05/20 - pt able to place ~75% of pegs into pegboard without dropping     OT LONG TERM GOAL #3   Title Pt will be independent with HEP for BUE strengthening and [place] to improve independence with functional tasks.    Baseline Pt reports no HEP for UE strength/coordination.    Time 6  Period Weeks    Status On-going    Target Date  12/16/20      OT LONG TERM GOAL #4   Title Pt will improve functional grasp of L hand as evidenced by increasing grip strength by >9 lbs.    Baseline LUE = 11 lbs; RUE = 33 lbs    Time 6    Period Weeks    Status Achieved   10/18/20 - LUE = 21 lbs; RUE = 36 lbs     OT LONG TERM GOAL #5   Title Pt will be able to deal standard-sized playing cards one-by-one using bilateral thumbs independently at least 80% of the time    Baseline Unable to deal cards using isolated thumb    Time 6    Period Weeks    Status On-going    Target Date 12/16/20            Plan - 11/11/20 1205    Clinical Impression Statement Pt demo'd some difficulty w/ visual scanning/attending to L side during word search activity; OT discussed vision w/ pt and she mentioned she has an appt w/ her optometrist coming up in April. OT also attempted to have pt use L hand during word search; pt unable to target accurately and OT graded activity down to use R hand. Subsequently, OT practiced simple targeting; accuracy improved w/ support provided under the wrist/forearm.    OT Occupational Profile and History Problem Focused Assessment - Including review of records relating to presenting problem    Occupational performance deficits (Please refer to evaluation for details): ADL's;IADL's    Body Structure / Function / Physical Skills ADL;UE functional use;Balance;Body mechanics;Pain;FMC;Proprioception;ROM;Coordination;GMC;Sensation;Strength;Dexterity    Rehab Potential Fair    Clinical Decision Making Several treatment options, min-mod task modification necessary    Comorbidities Affecting Occupational Performance: May have comorbidities impacting occupational performance    Modification or Assistance to Complete Evaluation  Min-Moderate modification of tasks or assist with assess necessary to complete eval    OT Frequency 1x / week    OT Duration 6 weeks    OT Treatment/Interventions Self-care/ADL training;Ultrasound;Compression  bandaging;DME and/or AE instruction;Patient/family education;Passive range of motion;Electrical Stimulation;Splinting;Moist Heat;Therapeutic exercise;Manual Therapy;Therapeutic activities;Neuromuscular education;Energy conservation;Iontophoresis;Cryotherapy;Functional Mobility Training;Aquatic Therapy    Plan Core strengthening and weight-shifting exercises    Consulted and Agree with Plan of Care Patient           Patient will benefit from skilled therapeutic intervention in order to improve the following deficits and impairments:   Body Structure / Function / Physical Skills: ADL,UE functional use,Balance,Body mechanics,Pain,FMC,Proprioception,ROM,Coordination,GMC,Sensation,Strength,Dexterity      Occupational Therapy Progress Note  Dates of Reporting Period: 10/05/20 to 11/11/20  Objective Reports of Subjective Statement: Pt appreciates that her new transport chair sits her up higher; pt is better able to reach tabletop height and counter surfaces  Objective Measurements: Pt is making progress toward desired goals and has demonstrated improved coordination and BUE strength  Goal Update: Pt has 2 new STGs and have achieved 1/3 STG to date; pt has also achieved 3/5 LTGs and continues to make progress toward remaining 2/5 LTGs.  Plan: Continuing to work toward functional UB dressing, L hand coordination and targeting, and bilateral [facility]  Reason Skilled Services are Required: Pt continues to demonstrate difficulty with ataxia, visual perception, pain, UB dressing tasks, and generalized endurance   Visit Diagnosis: Ataxia  Hemiplegia and hemiparesis following cerebral infarction affecting left non-dominant side (HCC)  Muscle weakness (generalized)  Other lack of coordination  Problem List Patient Active Problem List   Diagnosis Date Noted  . Orthostatic hypotension 10/15/2020  . Presbycusis of both ears 03/08/2020  . Mixed stress and urge urinary incontinence 12/02/2019  .  Macrocytosis 12/01/2019  . Nutritional anemia 12/01/2019  . S/P total knee replacement 01/27/2019  . Recurrent left knee instability 07/05/2018  . Respiratory failure with hypoxia ([place]) 08/30/2017  . Hypoxemia   . Heart failure with preserved ejection fraction ([place]), Grade 3 diastolic dysfunction 85/90/9311  . PAF (paroxysmal atrial fibrillation) ([place])   . Dyspnea 03/19/2017  . Encounter for preventive health examination 02/17/2016  . Sensorineural hearing loss (SNHL), bilateral 01/26/2016  . Morbid obesity ([place]) 06/17/2015  . Hypomagnesemia 04/24/2014  . Hemiparesis affecting left side as late effect of cerebrovascular accident ([place]) 04/24/2014  . Nontraumatic cerebral hemorrhage ([place]) 04/30/2012  . DM (diabetes mellitus) with complications ([place]) 21/62/4469  . OSTEOPENIA 01/21/2009  . UNSPECIFIED VITAMIN D DEFICIENCY 11/19/2007  . HYPERCHOLESTEROLEMIA 10/25/2006  . GASTROESOPHAGEAL REFLUX, NO ESOPHAGITIS 10/25/2006  . DIVERTICULOSIS OF COLON 10/25/2006  . Osteoarthritis 10/25/2006  . CERVICAL SPINE DISORDER, NOS 10/25/2006     Kathrine Cords, OTR/L, MSOT 11/11/2020, 12:40 PM  [place]. [place], [place], [address] Phone: [phone]   Fax:  [phone]  Name: LATRESHIA BEAUCHAINE MRN: [id number] Date of Birth: February 15, 1941

## 2020-11-11 NOTE — Therapy (Signed)
[place]. [place], [place], [address] Phone: [phone]   Fax:  [phone] Progress Note Reporting Period 10/11/20 to 11/11/20 for visits 31-40  See note below for Objective Data and Assessment of Progress/Goals.      Physical Therapy Treatment  Patient Details  Name: Karen Jackson MRN: [id number] Date of Birth: 02-06-1941 Referring Provider (PT): [place]   Encounter Date: 11/11/2020   PT End of Session - 11/11/20 1144    Visit Number 40    Date for PT Re-Evaluation 11/16/20    PT Start Time 1100    PT Stop Time 1140    PT Time Calculation (min) 40 min           Past Medical History:  Diagnosis Date  . Acute cystitis without hematuria   . Acute diastolic CHF (congestive heart failure) ([place])   . Arthritis   . Dyspnea   . Dysrhythmia   . Fever of unknown origin 03/19/2017  . Hyperlipidemia   . Hypertension    denies at preop  . Multifocal pneumonia   . Neuromuscular disorder ([place])    neuropathy left arm and foot  . Osteopenia   . Paralysis ([place])    partial left side from CVA   . Persistent atrial fibrillation ([place])   . PONV (postoperative nausea and vomiting)   . Pre-diabetes   . Stroke [facility]) 2013   hemmorahgic    Past Surgical History:  Procedure Laterality Date  . ANKLE SURGERY    . APPENDECTOMY    . CHOLECYSTECTOMY    . HERNIA REPAIR     Esophagus  . JOINT REPLACEMENT     total- right partial- left  . MASTECTOMY PARTIAL / LUMPECTOMY  2012   left  . ORIF ANKLE FRACTURE Left 07/20/2018   Procedure: OPEN REDUCTION INTERNAL FIXATION (ORIF) ANKLE FRACTURE;  Surgeon: Wylene Simmer, MD;  Location: [facility];  Service: Orthopedics;  Laterality: Left;  . TOTAL KNEE ARTHROPLASTY Left 01/27/2019   Procedure: TOTAL KNEE ARTHROPLASTY;  Surgeon: Vickey Huger, MD;  [facility];  Service: Orthopedics;  Laterality: Left;    There were no vitals filed for this visit.   Subjective Assessment -  11/11/20 1051    Subjective okay-except shld pain                             [facility] Adult PT Treatment/Exercise - 11/11/20 1110      Ambulation/Gait   Gait Comments amb 100 feet with RW 2nd walk 150 feet with 1 standing rest      Knee/Hip Exercises: Aerobic   Nustep L 5 500 steps 6 min 58 sec      Knee/Hip Exercises: Standing   Other Standing Knee Exercises 5# standing march,hip flex,ext and abd 10x      Knee/Hip Exercises: Seated   Long Arc Quad Strengthening;Both;3 sets;10 reps;Weights    Long Arc Quad Weight 5 lbs.    Sit to [place] without UE support;10 reps   cuing to stay fwd vs back on heel                   PT Short Term Goals - 06/18/20 1105      PT SHORT TERM GOAL #1   Title independent with initial HEP    Status Partially Met             PT Long Term Goals - 11/11/20  [place] #2   Status Partially Met      PT LONG TERM GOAL #3   Title walk 100 feet with SBA using the FWW    Baseline vaies with fatigue, SOB and if looks up unsteady    Status Partially Met      PT LONG TERM GOAL #4   Title transfer independently with set up    Status Partially Met      PT LONG TERM GOAL #5   Title decrease TUG to 39 seconds    Baseline with RW 41 sec, increased stand time from standard chair    Status Partially Met                 Plan - 11/11/20 1144    Clinical Impression Statement progressed wt today with LE ex. STS without UE with cuing and assitance. TUG 41 sec - slow to get up. progressing gait with cuing to look up.    PT Treatment/Interventions ADLs/Self Care Home Management;Cryotherapy;Electrical Stimulation;Moist Heat;Gait training;Functional mobility training;Therapeutic activities;Therapeutic exercise;Balance training;Neuromuscular re-education;Patient/family education;Manual techniques    PT Next Visit Plan work on progressing her function and independence           Patient will benefit from  skilled therapeutic intervention in order to improve the following deficits and impairments:  Abnormal gait,Cardiopulmonary status limiting activity,Decreased activity tolerance,Decreased balance,Decreased mobility,Decreased strength,Decreased coordination,Decreased range of motion,Difficulty walking,Increased muscle spasms,Pain  Visit Diagnosis: Hemiplegia and hemiparesis following cerebral infarction affecting left non-dominant side (HCC)  Muscle weakness (generalized)  Difficulty in walking, not elsewhere classified     Problem List Patient Active Problem List   Diagnosis Date Noted  . Orthostatic hypotension 10/15/2020  . Presbycusis of both ears 03/08/2020  . Mixed stress and urge urinary incontinence 12/02/2019  . Macrocytosis 12/01/2019  . Nutritional anemia 12/01/2019  . S/P total knee replacement 01/27/2019  . Recurrent left knee instability 07/05/2018  . Respiratory failure with hypoxia ([place]) 08/30/2017  . Hypoxemia   . Heart failure with preserved ejection fraction ([place]), Grade 3 diastolic dysfunction 82/64/1583  . PAF (paroxysmal atrial fibrillation) ([place])   . Dyspnea 03/19/2017  . Encounter for preventive health examination 02/17/2016  . Sensorineural hearing loss (SNHL), bilateral 01/26/2016  . Morbid obesity ([place]) 06/17/2015  . Hypomagnesemia 04/24/2014  . Hemiparesis affecting left side as late effect of cerebrovascular accident ([place]) 04/24/2014  . Nontraumatic cerebral hemorrhage ([place]) 04/30/2012  . DM (diabetes mellitus) with complications ([place]) 09/40/7680  . OSTEOPENIA 01/21/2009  . UNSPECIFIED VITAMIN D DEFICIENCY 11/19/2007  . HYPERCHOLESTEROLEMIA 10/25/2006  . GASTROESOPHAGEAL REFLUX, NO ESOPHAGITIS 10/25/2006  . DIVERTICULOSIS OF COLON 10/25/2006  . Osteoarthritis 10/25/2006  . CERVICAL SPINE DISORDER, NOS 10/25/2006    ,ANGIE PTA 11/11/2020, 11:46 AM  [place]. [place], [place], [address] Phone: [phone]   Fax:  [phone]  Name: Karen Jackson MRN: [id number] Date of Birth: 14-Jan-1941

## 2020-11-15 ENCOUNTER — Ambulatory Visit: Payer: Medicare Other | Admitting: Physical Therapy

## 2020-11-15 ENCOUNTER — Encounter: Payer: Self-pay | Admitting: Physical Therapy

## 2020-11-15 ENCOUNTER — Ambulatory Visit: Payer: Medicare Other | Admitting: Occupational Therapy

## 2020-11-15 ENCOUNTER — Encounter: Payer: Self-pay | Admitting: Occupational Therapy

## 2020-11-15 ENCOUNTER — Other Ambulatory Visit: Payer: Self-pay

## 2020-11-15 DIAGNOSIS — R270 Ataxia, unspecified: Secondary | ICD-10-CM

## 2020-11-15 DIAGNOSIS — I6990 Unspecified sequelae of unspecified cerebrovascular disease: Secondary | ICD-10-CM

## 2020-11-15 DIAGNOSIS — M6281 Muscle weakness (generalized): Secondary | ICD-10-CM | POA: Diagnosis not present

## 2020-11-15 DIAGNOSIS — R278 Other lack of coordination: Secondary | ICD-10-CM

## 2020-11-15 DIAGNOSIS — M25512 Pain in left shoulder: Secondary | ICD-10-CM | POA: Diagnosis not present

## 2020-11-15 DIAGNOSIS — R262 Difficulty in walking, not elsewhere classified: Secondary | ICD-10-CM | POA: Diagnosis not present

## 2020-11-15 DIAGNOSIS — I69354 Hemiplegia and hemiparesis following cerebral infarction affecting left non-dominant side: Secondary | ICD-10-CM

## 2020-11-15 NOTE — Therapy (Signed)
[facility]. [place], [place], [address] Phone: [phone]   Fax:  [phone]  Occupational Therapy Treatment  Patient Details  Name: Karen Jackson MRN: [id number] Date of Birth: 05/08/1941 Referring Provider (OT): Madison Hickman   Encounter Date: 11/15/2020   OT End of Session - 11/15/20 1332    Visit Number 21    Number of Visits 25    Date for OT Re-Evaluation 12/07/20    Authorization Type Medicare    Progress Note Due on Visit 90    OT Start Time 1018   Previous session ran late   OT Stop Time 1100    OT Time Calculation (min) 42 min    Activity Tolerance Patient tolerated treatment well;Patient limited by pain    Behavior During Therapy [facility] for tasks assessed/performed           Past Medical History:  Diagnosis Date  . Acute cystitis without hematuria   . Acute diastolic CHF (congestive heart failure) ([place])   . Arthritis   . Dyspnea   . Dysrhythmia   . Fever of unknown origin 03/19/2017  . Hyperlipidemia   . Hypertension    denies at preop  . Multifocal pneumonia   . Neuromuscular disorder ([place])    neuropathy left arm and foot  . Osteopenia   . Paralysis ([place])    partial left side from CVA   . Persistent atrial fibrillation ([place])   . PONV (postoperative nausea and vomiting)   . Pre-diabetes   . Stroke [facility]) 2013   hemmorahgic    Past Surgical History:  Procedure Laterality Date  . ANKLE SURGERY    . APPENDECTOMY    . CHOLECYSTECTOMY    . HERNIA REPAIR     Esophagus  . JOINT REPLACEMENT     total- right partial- left  . MASTECTOMY PARTIAL / LUMPECTOMY  2012   left  . ORIF ANKLE FRACTURE Left 07/20/2018   Procedure: OPEN REDUCTION INTERNAL FIXATION (ORIF) ANKLE FRACTURE;  Surgeon: Wylene Simmer, MD;  Location: [facility];  Service: Orthopedics;  Laterality: Left;  . TOTAL KNEE ARTHROPLASTY Left 01/27/2019   Procedure: TOTAL KNEE ARTHROPLASTY;  Surgeon: Vickey Huger, MD;  [facility];   Service: Orthopedics;  Laterality: Left;    There were no vitals filed for this visit.   Subjective Assessment - 11/15/20 1028    Subjective  Pt stated the pain in her shoulder was good all weekend, but that when her caregiver was getting her ready this morning it started hurting again.    Pertinent History CVA with L hemiparesis (2013), OA, HTN, CHF, pre-diabetes    Limitations Pain in LUE, impaired sensation, decreased strength and coordination, decreased PROM/AROM    Patient Stated Goals Improve functional use of LUE, decrease pain    Currently in Pain? Yes    Pain Score 3     Pain Location Shoulder    Pain Orientation Left    Pain Descriptors / Indicators Aching;Pins and needles    Pain Type Chronic pain    Pain Onset More than a month ago    Pain Frequency Constant            OT Treatments/Exercises (OP) - 11/15/20 1053      ADLs   UB Dressing OT discussed and demonstrated doffing/donning light zip-up jacket w/ adaptive technique; pt returned verbal understanding   Pt will bring a jacket next session to practice     Neurological Re-education  Exercises   Other Exercises 1 Pt completed interior of 48-piece puzzle to facilitate FM control and coodination, functional reach, and bilateral coordination. OT instructed pt to separate puzzle pieces into piles using L hand, and to use ipsilateral hand for pieces that side; when completing final 5 pieces, pt challenged to use L hand only and she was able to successfully place 4/5 pieces w/ Mod I      Modalities   Modalities Moist Heat   Pt completed puzzle activity during moist heat therapy     Moist Heat Therapy   Number Minutes Moist Heat 15 Minutes    Moist Heat Location Shoulder   Left     Splinting   Splinting OT donned LUE brace for shoulder support at start of session            OT Short Term Goals - 11/15/20 1340      OT SHORT TERM GOAL #1   Title Pt will doff open-front jacket with Min A at least 75% of the time     Baseline Mod A to doff zip-up jacket    Time 4    Period Weeks    Status On-going    Target Date 12/02/20      OT SHORT TERM GOAL #2   Title Pt will thread LUE into open-front jacket w/ Mod I in at least 1/2 trials    Baseline Max A to don zip-up jacket    Time 4    Period Weeks    Status On-going   11/01/20 - pt reports no pain ambulating w/ RW at home when she remembers to relax her grip on the walker handle     OT SHORT TERM GOAL #3   Title Per self-report, pt will report pain less than 3/10 in LUE when ambulating with RW at home at least 75% of the time.    Baseline Pt reports pain in LUE when ambulating with RW in the home.    Time 3    Period Weeks    Status Achieved            OT Long Term Goals - 11/04/20 1432      OT LONG TERM GOAL #1   Title Pt will demonstrate/verbalize understanding of compensation and joint protection strategies to decrease pain and increase independence during ADLs.    Baseline Currently Mod A with dressing; participation in ADLs limited by pain    Time 6    Period Weeks    Status Achieved   10/25/20 - pt verbalized understanding of joint protection strategies and states comfort w/ instructing caregivers during ADLs     OT LONG TERM GOAL #2   Title Pt will improve functional FM coordination of as evidenced by ability to place >50% of pegs into pegboard with LUE.    Baseline Unable to place small pegs with LUE during eval.    Time 6    Period Weeks    Status Achieved   10/05/20 - pt able to place ~75% of pegs into pegboard without dropping     OT LONG TERM GOAL #3   Title Pt will be independent with HEP for BUE strengthening and [place] to improve independence with functional tasks.    Baseline Pt reports no HEP for UE strength/coordination.    Time 6    Period Weeks    Status On-going    Target Date 12/16/20      OT LONG TERM GOAL #4   Title  Pt will improve functional grasp of L hand as evidenced by increasing grip strength by >9 lbs.     Baseline LUE = 11 lbs; RUE = 33 lbs    Time 6    Period Weeks    Status Achieved   10/18/20 - LUE = 21 lbs; RUE = 36 lbs     OT LONG TERM GOAL #5   Title Pt will be able to deal standard-sized playing cards one-by-one using bilateral thumbs independently at least 80% of the time    Baseline Unable to deal cards using isolated thumb    Time 6    Period Weeks    Status On-going    Target Date 12/16/20            Plan - 11/15/20 1334    Clinical Impression Statement OT reviewed current goals w/ pt due to recent re-certification; pt does not currently wish to work on [place], but affirms she would like to be able to don/doff a light jacket on her own. OT reviewed pt's current method for this and provided education on potential adaptive strategy to trial w/ pt next week. Due to ataxia and decreased [place], OT used puzzle activity to improve targeting and develop strategies for improving control and coordination of LUE; pt demo's increased success w/ [place] carried out closer to her body and OT discussed providing additional support during distal FM tasks to improve accuracy and control.    OT Occupational Profile and History Problem Focused Assessment - Including review of records relating to presenting problem    Occupational performance deficits (Please refer to evaluation for details): ADL's;IADL's    Body Structure / Function / Physical Skills ADL;UE functional use;Balance;Body mechanics;Pain;FMC;Proprioception;ROM;Coordination;GMC;Sensation;Strength;Dexterity    Rehab Potential Fair    Clinical Decision Making Several treatment options, min-mod task modification necessary    Comorbidities Affecting Occupational Performance: May have comorbidities impacting occupational performance    Modification or Assistance to Complete Evaluation  Min-Moderate modification of tasks or assist with assess necessary to complete eval    OT Frequency 1x / week    OT Duration 6 weeks    OT  Treatment/Interventions Self-care/ADL training;Ultrasound;Compression bandaging;DME and/or AE instruction;Patient/family education;Passive range of motion;Electrical Stimulation;Splinting;Moist Heat;Therapeutic exercise;Manual Therapy;Therapeutic activities;Neuromuscular education;Energy conservation;Iontophoresis;Cryotherapy;Functional Mobility Training;Aquatic Therapy    Plan Core strengthening and weight-shifting; LUE coordination    Consulted and Agree with Plan of Care Patient           Patient will benefit from skilled therapeutic intervention in order to improve the following deficits and impairments:   Body Structure / Function / Physical Skills: ADL,UE functional use,Balance,Body mechanics,Pain,FMC,Proprioception,ROM,Coordination,GMC,Sensation,Strength,Dexterity       Visit Diagnosis: Ataxia  Hemiplegia and hemiparesis following cerebral infarction affecting left non-dominant side (HCC)  Muscle weakness (generalized)  Other lack of coordination    Problem List Patient Active Problem List   Diagnosis Date Noted  . Orthostatic hypotension 10/15/2020  . Presbycusis of both ears 03/08/2020  . Mixed stress and urge urinary incontinence 12/02/2019  . Macrocytosis 12/01/2019  . Nutritional anemia 12/01/2019  . S/P total knee replacement 01/27/2019  . Recurrent left knee instability 07/05/2018  . Respiratory failure with hypoxia ([place]) 08/30/2017  . Hypoxemia   . Heart failure with preserved ejection fraction ([place]), Grade 3 diastolic dysfunction 16/05/9603  . PAF (paroxysmal atrial fibrillation) ([place])   . Dyspnea 03/19/2017  . Encounter for preventive health examination 02/17/2016  . Sensorineural hearing loss (SNHL), bilateral 01/26/2016  . Morbid obesity ([place]) 06/17/2015  .  Hypomagnesemia 04/24/2014  . Hemiparesis affecting left side as late effect of cerebrovascular accident ([place]) 04/24/2014  . Nontraumatic cerebral hemorrhage ([place]) 04/30/2012  . DM (diabetes  mellitus) with complications ([place]) 62/19/4712  . OSTEOPENIA 01/21/2009  . UNSPECIFIED VITAMIN D DEFICIENCY 11/19/2007  . HYPERCHOLESTEROLEMIA 10/25/2006  . GASTROESOPHAGEAL REFLUX, NO ESOPHAGITIS 10/25/2006  . DIVERTICULOSIS OF COLON 10/25/2006  . Osteoarthritis 10/25/2006  . CERVICAL SPINE DISORDER, NOS 10/25/2006     Kathrine Cords, OTR/L, MSOT 11/15/2020, 1:53 PM  [facility]. [place], [place], [address] Phone: [phone]   Fax:  [phone]  Name: Karen Jackson MRN: [id number] Date of Birth: 1941-08-05

## 2020-11-15 NOTE — Therapy (Signed)
[facility]. [place], [place], [address] Phone: [phone]   Fax:  [phone]  Physical Therapy Treatment  Patient Details  Name: Karen Jackson MRN: [id number] Date of Birth: 1941-02-16 Referring Provider (PT): Hensel   Encounter Date: 11/15/2020   PT End of Session - 11/15/20 1149    Visit Number 41    Date for PT Re-Evaluation 12/17/20    PT Start Time 1100    PT Stop Time 1144    PT Time Calculation (min) 44 min    Activity Tolerance Patient limited by pain    Behavior During Therapy [facility] for tasks assessed/performed           Past Medical History:  Diagnosis Date  . Acute cystitis without hematuria   . Acute diastolic CHF (congestive heart failure) ([place])   . Arthritis   . Dyspnea   . Dysrhythmia   . Fever of unknown origin 03/19/2017  . Hyperlipidemia   . Hypertension    denies at preop  . Multifocal pneumonia   . Neuromuscular disorder ([place])    neuropathy left arm and foot  . Osteopenia   . Paralysis ([place])    partial left side from CVA   . Persistent atrial fibrillation ([place])   . PONV (postoperative nausea and vomiting)   . Pre-diabetes   . Stroke [facility]) 2013   hemmorahgic    Past Surgical History:  Procedure Laterality Date  . ANKLE SURGERY    . APPENDECTOMY    . CHOLECYSTECTOMY    . HERNIA REPAIR     Esophagus  . JOINT REPLACEMENT     total- right partial- left  . MASTECTOMY PARTIAL / LUMPECTOMY  2012   left  . ORIF ANKLE FRACTURE Left 07/20/2018   Procedure: OPEN REDUCTION INTERNAL FIXATION (ORIF) ANKLE FRACTURE;  Surgeon: Wylene Simmer, MD;  Location: [facility];  Service: Orthopedics;  Laterality: Left;  . TOTAL KNEE ARTHROPLASTY Left 01/27/2019   Procedure: TOTAL KNEE ARTHROPLASTY;  Surgeon: Vickey Huger, MD;  [facility];  Service: Orthopedics;  Laterality: Left;    There were no vitals filed for this visit.   Subjective Assessment - 11/15/20 1120    Subjective Ankle rolled  this morning, almost fell, a little off balance today.    Currently in Pain? Yes    Pain Score 3     Pain Location Shoulder    Pain Orientation Left              [facility] PT Assessment - 11/15/20 1136      Timed Up and Go Test   Normal TUG (seconds) 27    TUG Comments FWW                         [facility] Adult PT Treatment/Exercise - 11/15/20 1136      Ambulation/Gait   Gait Comments with FWW 3x75 feet and one x 60 feet, she had more difficulty today with c/o pain and tingling in the left ankle and foot, she reports an incident prior to coming when the ankle rolled.      High Level Balance   High Level Balance Activities Backward walking;Side stepping    High Level Balance Comments 4" toe touches CGA needed      Knee/Hip Exercises: Aerobic   Nustep level 6 needed a lot more cues to keep left leg in and the left foot flat on the pedal, took 9 minutes  to get to 500 steps      Knee/Hip Exercises: Standing   Heel Raises Both;2 sets;10 reps    Hip Flexion Both;2 sets;10 reps    Hip Flexion Limitations 5#    Hip Abduction Both;1 set;10 reps    Abduction Limitations 2#      Knee/Hip Exercises: Seated   Long Arc Quad Strengthening;Both;3 sets;10 reps;Weights    Long Arc Quad Weight 5 lbs.    Other Seated Knee/Hip Exercises green tband ankle PF/DF, left ankle on sit fit motions, hip adduction red tband                    PT Short Term Goals - 06/18/20 1105      PT SHORT TERM GOAL #1   Title independent with initial HEP    Status Partially Met             PT Long Term Goals - 11/11/20 1124      PT LONG TERM GOAL #2   Status Partially Met      PT LONG TERM GOAL #3   Title walk 100 feet with SBA using the FWW    Baseline vaies with fatigue, SOB and if looks up unsteady    Status Partially Met      PT LONG TERM GOAL #4   Title transfer independently with set up    Status Partially Met      PT LONG TERM GOAL #5   Title decrease TUG to 39  seconds    Baseline with RW 41 sec, increased stand time from standard chair    Status Partially Met                 Plan - 11/15/20 1149    Clinical Impression Statement Patient struggled today, she seemed to have more issues with the left ankle and leg, she describes prior to coming to PT she had the left ankle almost roll and she almost fell, the caregive pushed her back into the bed to avoid falling.  She needed a lot more cues to keep the left knee in and the left foot ont he pedal with the nustep today and it took her much longer than her normal.  She reports some increased tingling up to the knee.  I feel that over the next three weeks we need to get the caregivers involved to help with transition to home HEP.  However Markisha tells me that the caregive that she thinks will be able to do this will not be back until April 1st.  At that time will will address this and answer and educate about the continuation at home    PT Next Visit Plan work on progressing her function and independence    Consulted and Agree with Plan of Care Patient           Patient will benefit from skilled therapeutic intervention in order to improve the following deficits and impairments:  Abnormal gait,Cardiopulmonary status limiting activity,Decreased activity tolerance,Decreased balance,Decreased mobility,Decreased strength,Decreased coordination,Decreased range of motion,Difficulty walking,Increased muscle spasms,Pain  Visit Diagnosis: Ataxia - Plan: PT plan of care cert/re-cert  Hemiplegia and hemiparesis following cerebral infarction affecting left non-dominant side ([place]) - Plan: PT plan of care cert/re-cert  Muscle weakness (generalized) - Plan: PT plan of care cert/re-cert  Other lack of coordination - Plan: PT plan of care cert/re-cert  Difficulty in walking, not elsewhere classified - Plan: PT plan of care cert/re-cert  Late effects of CVA (cerebrovascular  accident) - Plan: PT plan of care  cert/re-cert     Problem List Patient Active Problem List   Diagnosis Date Noted  . Orthostatic hypotension 10/15/2020  . Presbycusis of both ears 03/08/2020  . Mixed stress and urge urinary incontinence 12/02/2019  . Macrocytosis 12/01/2019  . Nutritional anemia 12/01/2019  . S/P total knee replacement 01/27/2019  . Recurrent left knee instability 07/05/2018  . Respiratory failure with hypoxia ([place]) 08/30/2017  . Hypoxemia   . Heart failure with preserved ejection fraction ([place]), Grade 3 diastolic dysfunction 43/70/0525  . PAF (paroxysmal atrial fibrillation) ([place])   . Dyspnea 03/19/2017  . Encounter for preventive health examination 02/17/2016  . Sensorineural hearing loss (SNHL), bilateral 01/26/2016  . Morbid obesity ([place]) 06/17/2015  . Hypomagnesemia 04/24/2014  . Hemiparesis affecting left side as late effect of cerebrovascular accident ([place]) 04/24/2014  . Nontraumatic cerebral hemorrhage ([place]) 04/30/2012  . DM (diabetes mellitus) with complications ([place]) 91/09/8900  . OSTEOPENIA 01/21/2009  . UNSPECIFIED VITAMIN D DEFICIENCY 11/19/2007  . HYPERCHOLESTEROLEMIA 10/25/2006  . GASTROESOPHAGEAL REFLUX, NO ESOPHAGITIS 10/25/2006  . DIVERTICULOSIS OF COLON 10/25/2006  . Osteoarthritis 10/25/2006  . CERVICAL SPINE DISORDER, NOS 10/25/2006    Sumner Boast., PT 11/15/2020, 11:57 AM  [facility]. [place], [place], [address] Phone: [phone]   Fax:  [phone]  Name: Karen Jackson MRN: [id number] Date of Birth: 08-22-1941

## 2020-11-19 ENCOUNTER — Encounter: Payer: Self-pay | Admitting: Physical Therapy

## 2020-11-19 ENCOUNTER — Ambulatory Visit: Payer: Medicare Other | Admitting: Physical Therapy

## 2020-11-19 ENCOUNTER — Encounter: Payer: Medicare Other | Admitting: Occupational Therapy

## 2020-11-19 DIAGNOSIS — R262 Difficulty in walking, not elsewhere classified: Secondary | ICD-10-CM

## 2020-11-19 DIAGNOSIS — I6990 Unspecified sequelae of unspecified cerebrovascular disease: Secondary | ICD-10-CM | POA: Diagnosis not present

## 2020-11-19 DIAGNOSIS — R278 Other lack of coordination: Secondary | ICD-10-CM

## 2020-11-19 DIAGNOSIS — R270 Ataxia, unspecified: Secondary | ICD-10-CM

## 2020-11-19 DIAGNOSIS — M25512 Pain in left shoulder: Secondary | ICD-10-CM | POA: Diagnosis not present

## 2020-11-19 DIAGNOSIS — M6281 Muscle weakness (generalized): Secondary | ICD-10-CM

## 2020-11-19 DIAGNOSIS — I69354 Hemiplegia and hemiparesis following cerebral infarction affecting left non-dominant side: Secondary | ICD-10-CM | POA: Diagnosis not present

## 2020-11-19 NOTE — Therapy (Signed)
[name]. [place], [place], [address] Phone: [phone]   Fax:  [phone]  Physical Therapy Treatment  Patient Details  Name: Karen Jackson MRN: [id number] Date of Birth: 1941/06/22 Referring Provider (PT): Hensel   Encounter Date: 11/19/2020   PT End of Session - 11/19/20 1159    Visit Number 42    Date for PT Re-Evaluation 12/17/20    PT Start Time 1012    PT Stop Time 1054    PT Time Calculation (min) 42 min    Activity Tolerance Patient tolerated treatment well    Behavior During Therapy [facility] for tasks assessed/performed           Past Medical History:  Diagnosis Date  . Acute cystitis without hematuria   . Acute diastolic CHF (congestive heart failure) ([place])   . Arthritis   . Dyspnea   . Dysrhythmia   . Fever of unknown origin 03/19/2017  . Hyperlipidemia   . Hypertension    denies at preop  . Multifocal pneumonia   . Neuromuscular disorder ([place])    neuropathy left arm and foot  . Osteopenia   . Paralysis ([place])    partial left side from CVA   . Persistent atrial fibrillation ([place])   . PONV (postoperative nausea and vomiting)   . Pre-diabetes   . Stroke [facility]) 2013   hemmorahgic    Past Surgical History:  Procedure Laterality Date  . ANKLE SURGERY    . APPENDECTOMY    . CHOLECYSTECTOMY    . HERNIA REPAIR     Esophagus  . JOINT REPLACEMENT     total- right partial- left  . MASTECTOMY PARTIAL / LUMPECTOMY  2012   left  . ORIF ANKLE FRACTURE Left 07/20/2018   Procedure: OPEN REDUCTION INTERNAL FIXATION (ORIF) ANKLE FRACTURE;  Surgeon: Wylene Simmer, MD;  Location: [facility];  Service: Orthopedics;  Laterality: Left;  . TOTAL KNEE ARTHROPLASTY Left 01/27/2019   Procedure: TOTAL KNEE ARTHROPLASTY;  Surgeon: Vickey Huger, MD;  [facility];  Service: Orthopedics;  Laterality: Left;    There were no vitals filed for this visit.   Subjective Assessment - 11/19/20 1019    Subjective Been  doing okay since the ankle issue, I just did not feel like I was balanced that day    Currently in Pain? No/denies                             [facility] Adult PT Treatment/Exercise - 11/19/20 0001      Ambulation/Gait   Gait Comments FWW, SBA 100'x3, very short of breath with this      Knee/Hip Exercises: Aerobic   Nustep level 5 x 7 minutes, continues to need cues fo the left knee to come back in      Knee/Hip Exercises: Supine   Bridges with Diona Foley Squeeze 2 sets;10 reps    Other Supine Knee/Hip Exercises feet on ball K2C, trunk rotation, small bridges and isometric abs.    Other Supine Knee/Hip Exercises use of sliding board and pillow case under the right heel, hip ad/abduction                    PT Short Term Goals - 06/18/20 1105      PT SHORT TERM GOAL #1   Title independent with initial HEP    Status Partially Met  PT Long Term Goals - 11/19/20 1201      PT LONG TERM GOAL #1   Title walk with hand hold assist x 100 feet    Status Partially Met      PT LONG TERM GOAL #2   Title increased right LE strength to 4-/5    Status Partially Met      PT LONG TERM GOAL #3   Title walk 100 feet with SBA using the FWW    Status Partially Met      PT LONG TERM GOAL #4   Title transfer independently with set up    Status Partially Met                 Plan - 11/19/20 1159    Clinical Impression Statement Better today, she was able to walk more, less issues, SBA only, short of breath wtih the walking.  I did some supine activities and this seemed to be tough for her with the weakness of the core.  I also had her work on the left lower extremity adduction, she has been having more issues with the left leg falling out due to a lack of control at the hip and the core    PT Next Visit Plan continue with the supine activities    Consulted and Agree with Plan of Care Patient           Patient will benefit from skilled therapeutic  intervention in order to improve the following deficits and impairments:  Abnormal gait,Cardiopulmonary status limiting activity,Decreased activity tolerance,Decreased balance,Decreased mobility,Decreased strength,Decreased coordination,Decreased range of motion,Difficulty walking,Increased muscle spasms,Pain  Visit Diagnosis: Ataxia  Hemiplegia and hemiparesis following cerebral infarction affecting left non-dominant side (HCC)  Muscle weakness (generalized)  Other lack of coordination  Difficulty in walking, not elsewhere classified  Late effects of CVA (cerebrovascular accident)  Left shoulder pain, unspecified chronicity     Problem List Patient Active Problem List   Diagnosis Date Noted  . Orthostatic hypotension 10/15/2020  . Presbycusis of both ears 03/08/2020  . Mixed stress and urge urinary incontinence 12/02/2019  . Macrocytosis 12/01/2019  . Nutritional anemia 12/01/2019  . S/P total knee replacement 01/27/2019  . Recurrent left knee instability 07/05/2018  . Respiratory failure with hypoxia ([place]) 08/30/2017  . Hypoxemia   . Heart failure with preserved ejection fraction ([place]), Grade 3 diastolic dysfunction 32/91/9166  . PAF (paroxysmal atrial fibrillation) ([place])   . Dyspnea 03/19/2017  . Encounter for preventive health examination 02/17/2016  . Sensorineural hearing loss (SNHL), bilateral 01/26/2016  . Morbid obesity ([place]) 06/17/2015  . Hypomagnesemia 04/24/2014  . Hemiparesis affecting left side as late effect of cerebrovascular accident ([place]) 04/24/2014  . Nontraumatic cerebral hemorrhage ([place]) 04/30/2012  . DM (diabetes mellitus) with complications ([place]) 06/00/4599  . OSTEOPENIA 01/21/2009  . UNSPECIFIED VITAMIN D DEFICIENCY 11/19/2007  . HYPERCHOLESTEROLEMIA 10/25/2006  . GASTROESOPHAGEAL REFLUX, NO ESOPHAGITIS 10/25/2006  . DIVERTICULOSIS OF COLON 10/25/2006  . Osteoarthritis 10/25/2006  . CERVICAL SPINE DISORDER, NOS 10/25/2006    Sumner Boast., PT 11/19/2020, 12:02 PM  [place]. [place], [place], [address] Phone: [phone]   Fax:  [phone]  Name: Karen Jackson MRN: [id number] Date of Birth: [birth year]

## 2020-11-22 ENCOUNTER — Ambulatory Visit: Payer: Medicare Other | Admitting: Physical Therapy

## 2020-11-22 ENCOUNTER — Encounter: Payer: Self-pay | Admitting: Physical Therapy

## 2020-11-22 ENCOUNTER — Ambulatory Visit: Payer: Medicare Other | Admitting: Occupational Therapy

## 2020-11-22 ENCOUNTER — Encounter: Payer: Self-pay | Admitting: Occupational Therapy

## 2020-11-22 ENCOUNTER — Other Ambulatory Visit: Payer: Self-pay | Admitting: Family Medicine

## 2020-11-22 ENCOUNTER — Other Ambulatory Visit: Payer: Self-pay

## 2020-11-22 DIAGNOSIS — I6990 Unspecified sequelae of unspecified cerebrovascular disease: Secondary | ICD-10-CM | POA: Diagnosis not present

## 2020-11-22 DIAGNOSIS — M6281 Muscle weakness (generalized): Secondary | ICD-10-CM | POA: Diagnosis not present

## 2020-11-22 DIAGNOSIS — R262 Difficulty in walking, not elsewhere classified: Secondary | ICD-10-CM | POA: Diagnosis not present

## 2020-11-22 DIAGNOSIS — R270 Ataxia, unspecified: Secondary | ICD-10-CM

## 2020-11-22 DIAGNOSIS — E7800 Pure hypercholesterolemia, unspecified: Secondary | ICD-10-CM

## 2020-11-22 DIAGNOSIS — R278 Other lack of coordination: Secondary | ICD-10-CM

## 2020-11-22 DIAGNOSIS — M25512 Pain in left shoulder: Secondary | ICD-10-CM | POA: Diagnosis not present

## 2020-11-22 DIAGNOSIS — I69354 Hemiplegia and hemiparesis following cerebral infarction affecting left non-dominant side: Secondary | ICD-10-CM

## 2020-11-22 DIAGNOSIS — E118 Type 2 diabetes mellitus with unspecified complications: Secondary | ICD-10-CM

## 2020-11-22 DIAGNOSIS — I5030 Unspecified diastolic (congestive) heart failure: Secondary | ICD-10-CM

## 2020-11-22 DIAGNOSIS — K219 Gastro-esophageal reflux disease without esophagitis: Secondary | ICD-10-CM

## 2020-11-22 NOTE — Therapy (Signed)
[place]. [place], [place], [address] Phone: [phone]   Fax:  [phone]  Occupational Therapy Treatment  Patient Details  Name: Karen Jackson MRN: [id number] Date of Birth: Jan 06, 1941 Referring Provider (OT): Madison Hickman   Encounter Date: 11/22/2020   OT End of Session - 11/22/20 1025    Visit Number 22    Number of Visits 25    Date for OT Re-Evaluation 12/07/20    Authorization Type Medicare    Progress Note Due on Visit 31    OT Start Time 1015    OT Stop Time 1058    OT Time Calculation (min) 43 min    Activity Tolerance Patient tolerated treatment well;Patient limited by pain    Behavior During Therapy [facility] for tasks assessed/performed           Past Medical History:  Diagnosis Date  . Acute cystitis without hematuria   . Acute diastolic CHF (congestive heart failure) ([place])   . Arthritis   . Dyspnea   . Dysrhythmia   . Fever of unknown origin 03/19/2017  . Hyperlipidemia   . Hypertension    denies at preop  . Multifocal pneumonia   . Neuromuscular disorder ([place])    neuropathy left arm and foot  . Osteopenia   . Paralysis ([place])    partial left side from CVA   . Persistent atrial fibrillation ([place])   . PONV (postoperative nausea and vomiting)   . Pre-diabetes   . Stroke [facility]) 2013   hemmorahgic    Past Surgical History:  Procedure Laterality Date  . ANKLE SURGERY    . APPENDECTOMY    . CHOLECYSTECTOMY    . HERNIA REPAIR     Esophagus  . JOINT REPLACEMENT     total- right partial- left  . MASTECTOMY PARTIAL / LUMPECTOMY  2012   left  . ORIF ANKLE FRACTURE Left 07/20/2018   Procedure: OPEN REDUCTION INTERNAL FIXATION (ORIF) ANKLE FRACTURE;  Surgeon: Wylene Simmer, MD;  Location: [facility];  Service: Orthopedics;  Laterality: Left;  . TOTAL KNEE ARTHROPLASTY Left 01/27/2019   Procedure: TOTAL KNEE ARTHROPLASTY;  Surgeon: Vickey Huger, MD;  [facility];  Service: Orthopedics;   Laterality: Left;    There were no vitals filed for this visit.   Subjective Assessment - 11/22/20 1024    Subjective  Pt stated "I can't figure it out" when attempting to doff zip-up jacket    Pertinent History CVA with L hemiparesis (2013), OA, HTN, CHF, pre-diabetes    Limitations Pain in LUE, impaired sensation, decreased strength and coordination, decreased PROM/AROM    Patient Stated Goals Improve functional use of LUE, decrease pain    Currently in Pain? Yes    Pain Score 3     Pain Location Shoulder    Pain Orientation Left    Pain Descriptors / Indicators Aching;Pins and needles    Pain Type Chronic pain    Pain Onset More than a month ago    Pain Frequency Constant            OT Treatments/Exercises (OP) - 11/22/20 1313      ADLs   UB Dressing Mod A doffing zip-up jacket; requiring assist mostly w/ initially pulling jacket off her shoulders prior to pulling BUEs out of sleeves      Cognitive Exercises   Simple Puzzle Tangrams completed for LUE coordination, visual-perception, and problem-solving. Pt able to complete 1/3 trials w/ assist from  OT; activity d/c due to difficulty      Hand Exercises   Other Hand Exercises Solitaire and Go Fish played to improve FM control/coordination and dexterity of L hand; pt instructed to support under her L wrist using her R hand when manipulating cards, which decreased pt's speed of movement and improved coordination      Neurological Re-education Exercises   Towel Slides Towel slides at tabletop height; 1 set of 10 completed in forward flexion. OT provided support at L shoulder to decrease discomfort during activity      Modalities   Modalities Moist Heat   Tangrams/towel slides completed simultaneously w/ moist heat therapy     Moist Heat Therapy   Number Minutes Moist Heat 15 Minutes    Moist Heat Location Shoulder   Left     Splinting   Splinting OT donned LUE brace for shoulder support at start of session             OT Short Term Goals - 11/15/20 1340      OT SHORT TERM GOAL #1   Title Pt will doff open-front jacket with Min A at least 75% of the time    Baseline Mod A to doff zip-up jacket    Time 4    Period Weeks    Status On-going    Target Date 12/02/20      OT SHORT TERM GOAL #2   Title Pt will thread LUE into open-front jacket w/ Mod I in at least 1/2 trials    Baseline Max A to don zip-up jacket    Time 4    Period Weeks    Status On-going   11/01/20 - pt reports no pain ambulating w/ RW at home when she remembers to relax her grip on the walker handle     OT SHORT TERM GOAL #3   Title Per self-report, pt will report pain less than 3/10 in LUE when ambulating with RW at home at least 75% of the time.    Baseline Pt reports pain in LUE when ambulating with RW in the home.    Time 3    Period Weeks    Status Achieved            OT Long Term Goals - 11/04/20 1432      OT LONG TERM GOAL #1   Title Pt will demonstrate/verbalize understanding of compensation and joint protection strategies to decrease pain and increase independence during ADLs.    Baseline Currently Mod A with dressing; participation in ADLs limited by pain    Time 6    Period Weeks    Status Achieved   10/25/20 - pt verbalized understanding of joint protection strategies and states comfort w/ instructing caregivers during ADLs     OT LONG TERM GOAL #2   Title Pt will improve functional FM coordination of as evidenced by ability to place >50% of pegs into pegboard with LUE.    Baseline Unable to place small pegs with LUE during eval.    Time 6    Period Weeks    Status Achieved   10/05/20 - pt able to place ~75% of pegs into pegboard without dropping     OT LONG TERM GOAL #3   Title Pt will be independent with HEP for BUE strengthening and [place] to improve independence with functional tasks.    Baseline Pt reports no HEP for UE strength/coordination.    Time 6    Period Weeks  Status On-going    Target Date  12/16/20      OT LONG TERM GOAL #4   Title Pt will improve functional grasp of L hand as evidenced by increasing grip strength by >9 lbs.    Baseline LUE = 11 lbs; RUE = 33 lbs    Time 6    Period Weeks    Status Achieved   10/18/20 - LUE = 21 lbs; RUE = 36 lbs     OT LONG TERM GOAL #5   Title Pt will be able to deal standard-sized playing cards one-by-one using bilateral thumbs independently at least 80% of the time    Baseline Unable to deal cards using isolated thumb    Time 6    Period Weeks    Status On-going    Target Date 12/16/20            Plan - 11/22/20 1248    Clinical Impression Statement Pt able to pull sleeves off BUEs once jacket was off her shoulders but experienced difficulty initially getting jacket off her shoulders. Due to difficulty of first puzzle attempted for problem-solving, [place], and visual perception, activity was d/c. LUE ataxia continued to be limiting during functional FM tasks; OT encouraged pt to support under her L wrist w/ her R hand which improved coordination and decreased pace during card game activity.    OT Occupational Profile and History Problem Focused Assessment - Including review of records relating to presenting problem    Occupational performance deficits (Please refer to evaluation for details): ADL's;IADL's    Body Structure / Function / Physical Skills ADL;UE functional use;Balance;Body mechanics;Pain;FMC;Proprioception;ROM;Coordination;GMC;Sensation;Strength;Dexterity    Rehab Potential Fair    Clinical Decision Making Several treatment options, min-mod task modification necessary    Comorbidities Affecting Occupational Performance: May have comorbidities impacting occupational performance    Modification or Assistance to Complete Evaluation  Min-Moderate modification of tasks or assist with assess necessary to complete eval    OT Frequency 1x / week    OT Duration 6 weeks    OT Treatment/Interventions Self-care/ADL  training;Ultrasound;Compression bandaging;DME and/or AE instruction;Patient/family education;Passive range of motion;Electrical Stimulation;Splinting;Moist Heat;Therapeutic exercise;Manual Therapy;Therapeutic activities;Neuromuscular education;Energy conservation;Iontophoresis;Cryotherapy;Functional Mobility Training;Aquatic Therapy    Plan Core strengthening and weight-shifting    Consulted and Agree with Plan of Care Patient           Patient will benefit from skilled therapeutic intervention in order to improve the following deficits and impairments:   Body Structure / Function / Physical Skills: ADL,UE functional use,Balance,Body mechanics,Pain,FMC,Proprioception,ROM,Coordination,GMC,Sensation,Strength,Dexterity       Visit Diagnosis: Ataxia  Hemiplegia and hemiparesis following cerebral infarction affecting left non-dominant side (HCC)  Muscle weakness (generalized)  Other lack of coordination    Problem List Patient Active Problem List   Diagnosis Date Noted  . Orthostatic hypotension 10/15/2020  . Presbycusis of both ears 03/08/2020  . Mixed stress and urge urinary incontinence 12/02/2019  . Macrocytosis 12/01/2019  . Nutritional anemia 12/01/2019  . S/P total knee replacement 01/27/2019  . Recurrent left knee instability 07/05/2018  . Respiratory failure with hypoxia ([place]) 08/30/2017  . Hypoxemia   . Heart failure with preserved ejection fraction ([place]), Grade 3 diastolic dysfunction 58/04/9832  . PAF (paroxysmal atrial fibrillation) ([place])   . Dyspnea 03/19/2017  . Encounter for preventive health examination 02/17/2016  . Sensorineural hearing loss (SNHL), bilateral 01/26/2016  . Morbid obesity ([place]) 06/17/2015  . Hypomagnesemia 04/24/2014  . Hemiparesis affecting left side as late effect of cerebrovascular accident ([place]) 04/24/2014  .  Nontraumatic cerebral hemorrhage ([place]) 04/30/2012  . DM (diabetes mellitus) with complications ([place]) 40/98/1191  . OSTEOPENIA  01/21/2009  . UNSPECIFIED VITAMIN D DEFICIENCY 11/19/2007  . HYPERCHOLESTEROLEMIA 10/25/2006  . GASTROESOPHAGEAL REFLUX, NO ESOPHAGITIS 10/25/2006  . DIVERTICULOSIS OF COLON 10/25/2006  . Osteoarthritis 10/25/2006  . CERVICAL SPINE DISORDER, NOS 10/25/2006     Kathrine Cords, OTR/L, MSOT 11/22/2020, 6:37 PM  [place]. [place], [place], [address] Phone: [phone]   Fax:  [phone]  Name: CHALISA KOBLER MRN: [id number] Date of Birth: [birth year]

## 2020-11-22 NOTE — Therapy (Signed)
[place]. [place], [place], [address] Phone: [phone]   Fax:  [phone]  Physical Therapy Treatment  Patient Details  Name: Karen Jackson MRN: [id number] Date of Birth: 01/05/1941 Referring Provider (PT): Hensel   Encounter Date: 11/22/2020   PT End of Session - 11/22/20 1147    Visit Number 43    Date for PT Re-Evaluation 12/17/20    PT Start Time 1100    PT Stop Time 1143    PT Time Calculation (min) 43 min    Activity Tolerance Patient tolerated treatment well;Patient limited by pain    Behavior During Therapy [facility] for tasks assessed/performed           Past Medical History:  Diagnosis Date  . Acute cystitis without hematuria   . Acute diastolic CHF (congestive heart failure) ([place])   . Arthritis   . Dyspnea   . Dysrhythmia   . Fever of unknown origin 03/19/2017  . Hyperlipidemia   . Hypertension    denies at preop  . Multifocal pneumonia   . Neuromuscular disorder ([place])    neuropathy left arm and foot  . Osteopenia   . Paralysis ([place])    partial left side from CVA   . Persistent atrial fibrillation ([place])   . PONV (postoperative nausea and vomiting)   . Pre-diabetes   . Stroke [facility]) 2013   hemmorahgic    Past Surgical History:  Procedure Laterality Date  . ANKLE SURGERY    . APPENDECTOMY    . CHOLECYSTECTOMY    . HERNIA REPAIR     Esophagus  . JOINT REPLACEMENT     total- right partial- left  . MASTECTOMY PARTIAL / LUMPECTOMY  2012   left  . ORIF ANKLE FRACTURE Left 07/20/2018   Procedure: OPEN REDUCTION INTERNAL FIXATION (ORIF) ANKLE FRACTURE;  Surgeon: Wylene Simmer, MD;  Location: [facility];  Service: Orthopedics;  Laterality: Left;  . TOTAL KNEE ARTHROPLASTY Left 01/27/2019   Procedure: TOTAL KNEE ARTHROPLASTY;  Surgeon: Vickey Huger, MD;  [facility];  Service: Orthopedics;  Laterality: Left;    There were no vitals filed for this visit.   Subjective Assessment - 11/22/20  1105    Subjective My left shoulder is hurting more    Currently in Pain? Yes    Pain Score 3     Pain Location Shoulder    Pain Orientation Left    Pain Descriptors / Indicators Aching    Aggravating Factors  cold weather                             [facility] Adult PT Treatment/Exercise - 11/22/20 0001      Ambulation/Gait   Gait Comments practiced picking up walker and moving over a dowel rod, again shoulder pain,  2x 100 feet SBA      Knee/Hip Exercises: Aerobic   Nustep level 6 x 7 minutes, continues to need cues fo the left knee to come back in      Knee/Hip Exercises: Standing   Hip Abduction Both;1 set;10 reps    Abduction Limitations 2# had to stop due to shoulder popping and pain      Knee/Hip Exercises: Seated   Other Seated Knee/Hip Exercises green tband ankle DF    Hamstring Curl Both;3 sets;10 reps    Hamstring Limitations blue tband cues for ROM      Knee/Hip Exercises: Supine  Bridges with [employer] 2 sets;10 reps    Other Supine Knee/Hip Exercises feet on ball K2C, trunk rotation, small bridges and isometric abs.    Other Supine Knee/Hip Exercises use of sliding board and pillow case under the right heel, hip ad/abduction, tried 2# bar chest press but this increased shoulder pain, did 10 partial sit ups                    PT Short Term Goals - 06/18/20 1105      PT SHORT TERM GOAL #1   Title independent with initial HEP    Status Partially Met             PT Long Term Goals - 11/19/20 1201      PT LONG TERM GOAL #1   Title walk with hand hold assist x 100 feet    Status Partially Met      PT LONG TERM GOAL #2   Title increased right LE strength to 4-/5    Status Partially Met      PT LONG TERM GOAL #3   Title walk 100 feet with SBA using the FWW    Status Partially Met      PT LONG TERM GOAL #4   Title transfer independently with set up    Status Partially Met                 Plan - 11/22/20 1148     Clinical Impression Statement Left shoulder pain was a limiting factor today, she reports that the cold weather has caused her to hurt more, many things that we tried increased the pain and significant crepitus today.  I have gone back to supine activitie sto work on her core to help with her function and she is doing well with this, just needs a lot of cues for left hip IR and adduction as she will let it roll out    PT Next Visit Plan continue with the supine activities    Consulted and Agree with Plan of Care Patient           Patient will benefit from skilled therapeutic intervention in order to improve the following deficits and impairments:  Abnormal gait,Cardiopulmonary status limiting activity,Decreased activity tolerance,Decreased balance,Decreased mobility,Decreased strength,Decreased coordination,Decreased range of motion,Difficulty walking,Increased muscle spasms,Pain  Visit Diagnosis: Ataxia  Hemiplegia and hemiparesis following cerebral infarction affecting left non-dominant side (HCC)  Muscle weakness (generalized)  Other lack of coordination  Difficulty in walking, not elsewhere classified  Late effects of CVA (cerebrovascular accident)  Left shoulder pain, unspecified chronicity     Problem List Patient Active Problem List   Diagnosis Date Noted  . Orthostatic hypotension 10/15/2020  . Presbycusis of both ears 03/08/2020  . Mixed stress and urge urinary incontinence 12/02/2019  . Macrocytosis 12/01/2019  . Nutritional anemia 12/01/2019  . S/P total knee replacement 01/27/2019  . Recurrent left knee instability 07/05/2018  . Respiratory failure with hypoxia ([place]) 08/30/2017  . Hypoxemia   . Heart failure with preserved ejection fraction ([place]), Grade 3 diastolic dysfunction 19/41/7408  . PAF (paroxysmal atrial fibrillation) ([place])   . Dyspnea 03/19/2017  . Encounter for preventive health examination 02/17/2016  . Sensorineural hearing loss (SNHL), bilateral  01/26/2016  . Morbid obesity ([place]) 06/17/2015  . Hypomagnesemia 04/24/2014  . Hemiparesis affecting left side as late effect of cerebrovascular accident ([place]) 04/24/2014  . Nontraumatic cerebral hemorrhage ([place]) 04/30/2012  . DM (diabetes mellitus) with complications ([place]) 14/48/1856  .  OSTEOPENIA 01/21/2009  . UNSPECIFIED VITAMIN D DEFICIENCY 11/19/2007  . HYPERCHOLESTEROLEMIA 10/25/2006  . GASTROESOPHAGEAL REFLUX, NO ESOPHAGITIS 10/25/2006  . DIVERTICULOSIS OF COLON 10/25/2006  . Osteoarthritis 10/25/2006  . CERVICAL SPINE DISORDER, NOS 10/25/2006    Sumner Boast., PT 11/22/2020, 11:50 AM  [place]. [place], [place], [address] Phone: [phone]   Fax:  [phone]  Name: Karen Jackson MRN: [id number] Date of Birth: 03-23-1941

## 2020-11-26 ENCOUNTER — Encounter: Payer: Medicare Other | Admitting: Occupational Therapy

## 2020-11-26 ENCOUNTER — Ambulatory Visit: Payer: Medicare Other | Attending: Family Medicine | Admitting: Physical Therapy

## 2020-11-26 ENCOUNTER — Other Ambulatory Visit: Payer: Self-pay

## 2020-11-26 ENCOUNTER — Encounter: Payer: Self-pay | Admitting: Physical Therapy

## 2020-11-26 DIAGNOSIS — R293 Abnormal posture: Secondary | ICD-10-CM | POA: Insufficient documentation

## 2020-11-26 DIAGNOSIS — M25512 Pain in left shoulder: Secondary | ICD-10-CM | POA: Diagnosis not present

## 2020-11-26 DIAGNOSIS — R278 Other lack of coordination: Secondary | ICD-10-CM | POA: Insufficient documentation

## 2020-11-26 DIAGNOSIS — I6990 Unspecified sequelae of unspecified cerebrovascular disease: Secondary | ICD-10-CM | POA: Diagnosis not present

## 2020-11-26 DIAGNOSIS — I69354 Hemiplegia and hemiparesis following cerebral infarction affecting left non-dominant side: Secondary | ICD-10-CM | POA: Diagnosis not present

## 2020-11-26 DIAGNOSIS — M6281 Muscle weakness (generalized): Secondary | ICD-10-CM | POA: Diagnosis not present

## 2020-11-26 DIAGNOSIS — R270 Ataxia, unspecified: Secondary | ICD-10-CM | POA: Insufficient documentation

## 2020-11-26 DIAGNOSIS — R262 Difficulty in walking, not elsewhere classified: Secondary | ICD-10-CM | POA: Insufficient documentation

## 2020-11-26 NOTE — Therapy (Signed)
[place]. [place], [place], [address] Phone: [phone]   Fax:  [phone]  Physical Therapy Treatment  Patient Details  Name: Karen Jackson MRN: [id number] Date of Birth: [birth year] Referring Provider (PT): Hensel   Encounter Date: 11/26/2020   PT End of Session - 11/26/20 1145    Visit Number 44    Date for PT Re-Evaluation 12/17/20    PT Start Time 1013    PT Stop Time 1053    PT Time Calculation (min) 40 min    Activity Tolerance Patient tolerated treatment well    Behavior During Therapy [facility] for tasks assessed/performed           Past Medical History:  Diagnosis Date  . Acute cystitis without hematuria   . Acute diastolic CHF (congestive heart failure) ([place])   . Arthritis   . Dyspnea   . Dysrhythmia   . Fever of unknown origin 03/19/2017  . Hyperlipidemia   . Hypertension    denies at preop  . Multifocal pneumonia   . Neuromuscular disorder ([place])    neuropathy left arm and foot  . Osteopenia   . Paralysis ([place])    partial left side from CVA   . Persistent atrial fibrillation ([place])   . PONV (postoperative nausea and vomiting)   . Pre-diabetes   . Stroke [facility]) 2013   hemmorahgic    Past Surgical History:  Procedure Laterality Date  . ANKLE SURGERY    . APPENDECTOMY    . CHOLECYSTECTOMY    . HERNIA REPAIR     Esophagus  . JOINT REPLACEMENT     total- right partial- left  . MASTECTOMY PARTIAL / LUMPECTOMY  2012   left  . ORIF ANKLE FRACTURE Left 07/20/2018   Procedure: OPEN REDUCTION INTERNAL FIXATION (ORIF) ANKLE FRACTURE;  Surgeon: Wylene Simmer, MD;  Location: [facility];  Service: Orthopedics;  Laterality: Left;  . TOTAL KNEE ARTHROPLASTY Left 01/27/2019   Procedure: TOTAL KNEE ARTHROPLASTY;  Surgeon: Vickey Huger, MD;  [facility];  Service: Orthopedics;  Laterality: Left;    There were no vitals filed for this visit.   Subjective Assessment - 11/26/20 1016    Subjective less  pain in the shoulder more pins and needles    Currently in Pain? Yes    Pain Score 2     Pain Location Shoulder    Pain Orientation Left              [facility] PT Assessment - 11/26/20 0001      Timed Up and Go Test   Normal TUG (seconds) 33                         [facility] Adult PT Treatment/Exercise - 11/26/20 0001      Ambulation/Gait   Gait Comments FWW SBA 3x110 feet, very fatigued with this      Knee/Hip Exercises: Aerobic   Nustep level 6 x 7 minutes, continues to need cues fo the left knee to come back in      Knee/Hip Exercises: Supine   Bridges with Diona Foley Squeeze 2 sets;10 reps    Other Supine Knee/Hip Exercises feet on ball K2C, trunk rotation, small bridges and isometric abs.    Other Supine Knee/Hip Exercises hip adduction, letting left fallout and then her bringing it back to adduction, 2x10 partial sit ups  PT Short Term Goals - 06/18/20 1105      PT SHORT TERM GOAL #1   Title independent with initial HEP    Status Partially Met             PT Long Term Goals - 11/26/20 1147      PT LONG TERM GOAL #1   Title walk with hand hold assist x 100 feet    Status Partially Met                 Plan - 11/26/20 1146    Clinical Impression Statement Lefft shoulder pain was better today, we did some incresaed core activities and tried to work on the walking.  She fatigues with this and needs the SBA    PT Next Visit Plan work iwth caregiver on the HEP    Consulted and Agree with Plan of Care Patient           Patient will benefit from skilled therapeutic intervention in order to improve the following deficits and impairments:  Abnormal gait,Cardiopulmonary status limiting activity,Decreased activity tolerance,Decreased balance,Decreased mobility,Decreased strength,Decreased coordination,Decreased range of motion,Difficulty walking,Increased muscle spasms,Pain  Visit Diagnosis: Ataxia  Hemiplegia and  hemiparesis following cerebral infarction affecting left non-dominant side (HCC)  Muscle weakness (generalized)  Other lack of coordination  Difficulty in walking, not elsewhere classified  Late effects of CVA (cerebrovascular accident)  Left shoulder pain, unspecified chronicity  Abnormal posture     Problem List Patient Active Problem List   Diagnosis Date Noted  . Orthostatic hypotension 10/15/2020  . Presbycusis of both ears 03/08/2020  . Mixed stress and urge urinary incontinence 12/02/2019  . Macrocytosis 12/01/2019  . Nutritional anemia 12/01/2019  . S/P total knee replacement 01/27/2019  . Recurrent left knee instability 07/05/2018  . Respiratory failure with hypoxia ([place]) 08/30/2017  . Hypoxemia   . Heart failure with preserved ejection fraction ([place]), Grade 3 diastolic dysfunction 14/27/6701  . PAF (paroxysmal atrial fibrillation) ([place])   . Dyspnea 03/19/2017  . Encounter for preventive health examination 02/17/2016  . Sensorineural hearing loss (SNHL), bilateral 01/26/2016  . Morbid obesity ([place]) 06/17/2015  . Hypomagnesemia 04/24/2014  . Hemiparesis affecting left side as late effect of cerebrovascular accident ([place]) 04/24/2014  . Nontraumatic cerebral hemorrhage ([place]) 04/30/2012  . DM (diabetes mellitus) with complications ([place]) 05/30/4960  . OSTEOPENIA 01/21/2009  . UNSPECIFIED VITAMIN D DEFICIENCY 11/19/2007  . HYPERCHOLESTEROLEMIA 10/25/2006  . GASTROESOPHAGEAL REFLUX, NO ESOPHAGITIS 10/25/2006  . DIVERTICULOSIS OF COLON 10/25/2006  . Osteoarthritis 10/25/2006  . CERVICAL SPINE DISORDER, NOS 10/25/2006    Sumner Boast., PT 11/26/2020, 11:48 AM  [place]. [place], [place], [address] Phone: [phone]   Fax:  [phone]  Name: JULIE-ANNE TORAIN MRN: [id number] Date of Birth: October 02, 1940

## 2020-11-29 ENCOUNTER — Encounter: Payer: Self-pay | Admitting: Physical Therapy

## 2020-11-29 ENCOUNTER — Other Ambulatory Visit: Payer: Self-pay

## 2020-11-29 ENCOUNTER — Ambulatory Visit: Payer: Medicare Other | Admitting: Physical Therapy

## 2020-11-29 DIAGNOSIS — M25512 Pain in left shoulder: Secondary | ICD-10-CM

## 2020-11-29 DIAGNOSIS — R293 Abnormal posture: Secondary | ICD-10-CM

## 2020-11-29 DIAGNOSIS — M6281 Muscle weakness (generalized): Secondary | ICD-10-CM

## 2020-11-29 DIAGNOSIS — I69354 Hemiplegia and hemiparesis following cerebral infarction affecting left non-dominant side: Secondary | ICD-10-CM | POA: Diagnosis not present

## 2020-11-29 DIAGNOSIS — R262 Difficulty in walking, not elsewhere classified: Secondary | ICD-10-CM

## 2020-11-29 DIAGNOSIS — R278 Other lack of coordination: Secondary | ICD-10-CM | POA: Diagnosis not present

## 2020-11-29 DIAGNOSIS — R270 Ataxia, unspecified: Secondary | ICD-10-CM

## 2020-11-29 DIAGNOSIS — I6990 Unspecified sequelae of unspecified cerebrovascular disease: Secondary | ICD-10-CM | POA: Diagnosis not present

## 2020-11-29 NOTE — Therapy (Signed)
[place]. [place], [place], [address] Phone: [phone]   Fax:  [phone]  Physical Therapy Treatment  Patient Details  Name: Karen Jackson MRN: [id number] Date of Birth: 11-25-1940 Referring Provider (PT): Hensel   Encounter Date: 11/29/2020   PT End of Session - 11/29/20 1146    Visit Number 45    Date for PT Re-Evaluation 12/17/20    PT Start Time 1057    PT Stop Time 1140    PT Time Calculation (min) 43 min    Activity Tolerance Patient tolerated treatment well    Behavior During Therapy [facility] for tasks assessed/performed           Past Medical History:  Diagnosis Date  . Acute cystitis without hematuria   . Acute diastolic CHF (congestive heart failure) ([place])   . Arthritis   . Dyspnea   . Dysrhythmia   . Fever of unknown origin 03/19/2017  . Hyperlipidemia   . Hypertension    denies at preop  . Multifocal pneumonia   . Neuromuscular disorder ([place])    neuropathy left arm and foot  . Osteopenia   . Paralysis ([place])    partial left side from CVA   . Persistent atrial fibrillation ([place])   . PONV (postoperative nausea and vomiting)   . Pre-diabetes   . Stroke [facility]) 2013   hemmorahgic    Past Surgical History:  Procedure Laterality Date  . ANKLE SURGERY    . APPENDECTOMY    . CHOLECYSTECTOMY    . HERNIA REPAIR     Esophagus  . JOINT REPLACEMENT     total- right partial- left  . MASTECTOMY PARTIAL / LUMPECTOMY  2012   left  . ORIF ANKLE FRACTURE Left 07/20/2018   Procedure: OPEN REDUCTION INTERNAL FIXATION (ORIF) ANKLE FRACTURE;  Surgeon: Wylene Simmer, MD;  Location: [place];  Service: Orthopedics;  Laterality: Left;  . TOTAL KNEE ARTHROPLASTY Left 01/27/2019   Procedure: TOTAL KNEE ARTHROPLASTY;  Surgeon: Vickey Huger, MD;  [facility];  Service: Orthopedics;  Laterality: Left;    There were no vitals filed for this visit.   Subjective Assessment - 11/29/20 1103    Subjective Doing  okay, I got my caregiver back after her surgery    Currently in Pain? Yes    Pain Score 3     Pain Location Shoulder    Pain Orientation Left    Pain Descriptors / Indicators Sore                             [facility] Adult PT Treatment/Exercise - 11/29/20 0001      Ambulation/Gait   Gait Comments FWW SBA 1x120 feet, 2x 100 feet      Knee/Hip Exercises: Aerobic   Nustep level 6 x 7 minutes, continues to need cues fo the left knee to come back in      Knee/Hip Exercises: Seated   Long Arc Quad Strengthening;Both;3 sets;10 reps;Weights    Long Arc Quad Weight 5 lbs.    Other Seated Knee/Hip Exercises green tband ankle DF    Marching Both;2 sets;10 reps    Marching Weights 5 lbs.    Hamstring Curl Both;3 sets;10 reps    Hamstring Limitations blue tband cues for ROM                    PT Short Term Goals - 06/18/20 1105  PT SHORT TERM GOAL #1   Title independent with initial HEP    Status Partially Met             PT Long Term Goals - 11/26/20 1147      PT LONG TERM GOAL #1   Title walk with hand hold assist x 100 feet    Status Partially Met                 Plan - 11/29/20 1147    Clinical Impression Statement I have started talking with her caregiver that will be with her most of the time, we are looking at the exercises that she can do and walking, I am encouraging an extra walk a day at home as she is doing well with me, and at home she is walking from bed to chair, then to bathroom as needed.    PT Next Visit Plan continue to work on what she will do when she is discharged    Consulted and Agree with Plan of Care Patient           Patient will benefit from skilled therapeutic intervention in order to improve the following deficits and impairments:  Abnormal gait,Cardiopulmonary status limiting activity,Decreased activity tolerance,Decreased balance,Decreased mobility,Decreased strength,Decreased coordination,Decreased range  of motion,Difficulty walking,Increased muscle spasms,Pain  Visit Diagnosis: Ataxia  Hemiplegia and hemiparesis following cerebral infarction affecting left non-dominant side (HCC)  Muscle weakness (generalized)  Other lack of coordination  Difficulty in walking, not elsewhere classified  Late effects of CVA (cerebrovascular accident)  Left shoulder pain, unspecified chronicity  Abnormal posture     Problem List Patient Active Problem List   Diagnosis Date Noted  . Orthostatic hypotension 10/15/2020  . Presbycusis of both ears 03/08/2020  . Mixed stress and urge urinary incontinence 12/02/2019  . Macrocytosis 12/01/2019  . Nutritional anemia 12/01/2019  . S/P total knee replacement 01/27/2019  . Recurrent left knee instability 07/05/2018  . Respiratory failure with hypoxia ([place]) 08/30/2017  . Hypoxemia   . Heart failure with preserved ejection fraction ([place]), Grade 3 diastolic dysfunction 96/28/3662  . PAF (paroxysmal atrial fibrillation) ([place])   . Dyspnea 03/19/2017  . Encounter for preventive health examination 02/17/2016  . Sensorineural hearing loss (SNHL), bilateral 01/26/2016  . Morbid obesity ([place]) 06/17/2015  . Hypomagnesemia 04/24/2014  . Hemiparesis affecting left side as late effect of cerebrovascular accident ([place]) 04/24/2014  . Nontraumatic cerebral hemorrhage ([place]) 04/30/2012  . DM (diabetes mellitus) with complications ([place]) 94/76/5465  . OSTEOPENIA 01/21/2009  . UNSPECIFIED VITAMIN D DEFICIENCY 11/19/2007  . HYPERCHOLESTEROLEMIA 10/25/2006  . GASTROESOPHAGEAL REFLUX, NO ESOPHAGITIS 10/25/2006  . DIVERTICULOSIS OF COLON 10/25/2006  . Osteoarthritis 10/25/2006  . CERVICAL SPINE DISORDER, NOS 10/25/2006    Sumner Boast., PT 11/29/2020, 11:50 AM  [place]. [place], [place], [address] Phone: [phone]   Fax:  [phone]  Name: Karen Jackson MRN: [id number] Date of Birth:  [birth year]

## 2020-11-30 DIAGNOSIS — H6121 Impacted cerumen, right ear: Secondary | ICD-10-CM | POA: Diagnosis not present

## 2020-12-01 DIAGNOSIS — Z961 Presence of intraocular lens: Secondary | ICD-10-CM | POA: Diagnosis not present

## 2020-12-01 DIAGNOSIS — H52203 Unspecified astigmatism, bilateral: Secondary | ICD-10-CM | POA: Diagnosis not present

## 2020-12-01 DIAGNOSIS — H26492 Other secondary cataract, left eye: Secondary | ICD-10-CM | POA: Diagnosis not present

## 2020-12-01 DIAGNOSIS — E119 Type 2 diabetes mellitus without complications: Secondary | ICD-10-CM | POA: Diagnosis not present

## 2020-12-01 LAB — HM DIABETES EYE EXAM

## 2020-12-03 ENCOUNTER — Ambulatory Visit: Payer: Medicare Other | Admitting: Physical Therapy

## 2020-12-03 ENCOUNTER — Other Ambulatory Visit: Payer: Self-pay

## 2020-12-03 ENCOUNTER — Encounter: Payer: Self-pay | Admitting: Physical Therapy

## 2020-12-03 DIAGNOSIS — R262 Difficulty in walking, not elsewhere classified: Secondary | ICD-10-CM

## 2020-12-03 DIAGNOSIS — M6281 Muscle weakness (generalized): Secondary | ICD-10-CM | POA: Diagnosis not present

## 2020-12-03 DIAGNOSIS — R278 Other lack of coordination: Secondary | ICD-10-CM | POA: Diagnosis not present

## 2020-12-03 DIAGNOSIS — M25512 Pain in left shoulder: Secondary | ICD-10-CM

## 2020-12-03 DIAGNOSIS — I69354 Hemiplegia and hemiparesis following cerebral infarction affecting left non-dominant side: Secondary | ICD-10-CM

## 2020-12-03 DIAGNOSIS — I6990 Unspecified sequelae of unspecified cerebrovascular disease: Secondary | ICD-10-CM | POA: Diagnosis not present

## 2020-12-03 DIAGNOSIS — R270 Ataxia, unspecified: Secondary | ICD-10-CM | POA: Diagnosis not present

## 2020-12-03 NOTE — Therapy (Signed)
[place]. [place], [place], [address] Phone: [phone]   Fax:  [phone]  Physical Therapy Treatment  Patient Details  Name: Karen Jackson MRN: [id number] Date of Birth: 1941-08-02 Referring Provider (PT): Hensel   Encounter Date: 12/03/2020   PT End of Session - 12/03/20 1145    Visit Number 56    Date for PT Re-Evaluation 12/17/20    PT Start Time 1057    PT Stop Time 1145    PT Time Calculation (min) 48 min    Activity Tolerance Patient tolerated treatment well    Behavior During Therapy [facility] for tasks assessed/performed           Past Medical History:  Diagnosis Date  . Acute cystitis without hematuria   . Acute diastolic CHF (congestive heart failure) ([place])   . Arthritis   . Dyspnea   . Dysrhythmia   . Fever of unknown origin 03/19/2017  . Hyperlipidemia   . Hypertension    denies at preop  . Multifocal pneumonia   . Neuromuscular disorder ([place])    neuropathy left arm and foot  . Osteopenia   . Paralysis ([place])    partial left side from CVA   . Persistent atrial fibrillation ([place])   . PONV (postoperative nausea and vomiting)   . Pre-diabetes   . Stroke [facility]) 2013   hemmorahgic    Past Surgical History:  Procedure Laterality Date  . ANKLE SURGERY    . APPENDECTOMY    . CHOLECYSTECTOMY    . HERNIA REPAIR     Esophagus  . JOINT REPLACEMENT     total- right partial- left  . MASTECTOMY PARTIAL / LUMPECTOMY  2012   left  . ORIF ANKLE FRACTURE Left 07/20/2018   Procedure: OPEN REDUCTION INTERNAL FIXATION (ORIF) ANKLE FRACTURE;  Surgeon: Wylene Simmer, MD;  Location: [place];  Service: Orthopedics;  Laterality: Left;  . TOTAL KNEE ARTHROPLASTY Left 01/27/2019   Procedure: TOTAL KNEE ARTHROPLASTY;  Surgeon: Vickey Huger, MD;  [facility];  Service: Orthopedics;  Laterality: Left;    There were no vitals filed for this visit.   Subjective Assessment - 12/03/20 1103    Subjective My  shoulders are really hurting today    Currently in Pain? Yes                             [facility] Adult PT Treatment/Exercise - 12/03/20 0001      Ambulation/Gait   Gait Comments FWW, SBA, had difficulty the first two time of 22' c/o ankle pain, I did some ankle, calf stretches and redid her brace and shoes and this got much better with no c/o pain in the ankles/feet      Knee/Hip Exercises: Aerobic   Nustep Level 6 x 7.5 minutes      Knee/Hip Exercises: Seated   Other Seated Knee/Hip Exercises green tband ankle DF    Hamstring Curl Both;3 sets;10 reps    Hamstring Limitations blue tband cues for ROM      Knee/Hip Exercises: Supine   Bridges with Ball Squeeze 2 sets;10 reps    Straight Leg Raises Left;3 sets;10 reps    Other Supine Knee/Hip Exercises feet on ball K2C, trunk rotation, small bridges and isometric abs.    Other Supine Knee/Hip Exercises hip adduction, letting left fallout and then her bringing it back to adduction, 2x10 partial sit ups, used sliding board  for more active abduction ad adduction                    PT Short Term Goals - 06/18/20 1105      PT SHORT TERM GOAL #1   Title independent with initial HEP    Status Partially Met             PT Long Term Goals - 12/03/20 1151      PT LONG TERM GOAL #3   Title walk 100 feet with SBA using the FWW    Status Achieved                 Plan - 12/03/20 1146    Clinical Impression Statement I have continued to add some supine exercises working on core and more functional bed mobility for her and her strength.  She had issues with feet and ankles today and needed the brace and her shoes to be redone.  She now has her more permanent and reliable caregiver and we will be working with her on the HEP and the gait    PT Next Visit Plan continue to work on what she will do when she is discharged    Consulted and Agree with Plan of Care Patient           Patient will benefit from  skilled therapeutic intervention in order to improve the following deficits and impairments:  Abnormal gait,Cardiopulmonary status limiting activity,Decreased activity tolerance,Decreased balance,Decreased mobility,Decreased strength,Decreased coordination,Decreased range of motion,Difficulty walking,Increased muscle spasms,Pain  Visit Diagnosis: Ataxia  Hemiplegia and hemiparesis following cerebral infarction affecting left non-dominant side (HCC)  Muscle weakness (generalized)  Other lack of coordination  Difficulty in walking, not elsewhere classified  Late effects of CVA (cerebrovascular accident)  Left shoulder pain, unspecified chronicity     Problem List Patient Active Problem List   Diagnosis Date Noted  . Orthostatic hypotension 10/15/2020  . Presbycusis of both ears 03/08/2020  . Mixed stress and urge urinary incontinence 12/02/2019  . Macrocytosis 12/01/2019  . Nutritional anemia 12/01/2019  . S/P total knee replacement 01/27/2019  . Recurrent left knee instability 07/05/2018  . Respiratory failure with hypoxia ([place]) 08/30/2017  . Hypoxemia   . Heart failure with preserved ejection fraction ([place]), Grade 3 diastolic dysfunction 71/29/2909  . PAF (paroxysmal atrial fibrillation) ([place])   . Dyspnea 03/19/2017  . Encounter for preventive health examination 02/17/2016  . Sensorineural hearing loss (SNHL), bilateral 01/26/2016  . Morbid obesity ([place]) 06/17/2015  . Hypomagnesemia 04/24/2014  . Hemiparesis affecting left side as late effect of cerebrovascular accident ([place]) 04/24/2014  . Nontraumatic cerebral hemorrhage ([place]) 04/30/2012  . DM (diabetes mellitus) with complications ([place]) 10/27/4994  . OSTEOPENIA 01/21/2009  . UNSPECIFIED VITAMIN D DEFICIENCY 11/19/2007  . HYPERCHOLESTEROLEMIA 10/25/2006  . GASTROESOPHAGEAL REFLUX, NO ESOPHAGITIS 10/25/2006  . DIVERTICULOSIS OF COLON 10/25/2006  . Osteoarthritis 10/25/2006  . CERVICAL SPINE DISORDER, NOS 10/25/2006     Sumner Boast., PT 12/03/2020, 11:51 AM  [place]. [place], [place], [address] Phone: [phone]   Fax:  [phone]  Name: Karen Jackson MRN: [id number] Date of Birth: 03/15/1941

## 2020-12-06 ENCOUNTER — Encounter: Payer: Self-pay | Admitting: Physical Therapy

## 2020-12-06 ENCOUNTER — Ambulatory Visit: Payer: Medicare Other | Admitting: Physical Therapy

## 2020-12-06 ENCOUNTER — Other Ambulatory Visit: Payer: Self-pay

## 2020-12-06 ENCOUNTER — Ambulatory Visit: Payer: Medicare Other | Admitting: Occupational Therapy

## 2020-12-06 DIAGNOSIS — I69354 Hemiplegia and hemiparesis following cerebral infarction affecting left non-dominant side: Secondary | ICD-10-CM

## 2020-12-06 DIAGNOSIS — M6281 Muscle weakness (generalized): Secondary | ICD-10-CM | POA: Diagnosis not present

## 2020-12-06 DIAGNOSIS — I6990 Unspecified sequelae of unspecified cerebrovascular disease: Secondary | ICD-10-CM

## 2020-12-06 DIAGNOSIS — R262 Difficulty in walking, not elsewhere classified: Secondary | ICD-10-CM

## 2020-12-06 DIAGNOSIS — R278 Other lack of coordination: Secondary | ICD-10-CM

## 2020-12-06 DIAGNOSIS — R270 Ataxia, unspecified: Secondary | ICD-10-CM

## 2020-12-06 NOTE — Therapy (Signed)
[place]. [place], [place], [address] Phone: [phone]   Fax:  [phone]  Physical Therapy Treatment  Patient Details  Name: Karen Jackson MRN: [id number] Date of Birth: [birth year] Referring Provider (PT): Hensel   Encounter Date: 12/06/2020   PT End of Session - 12/06/20 1145    Visit Number 45    Date for PT Re-Evaluation 12/17/20    PT Start Time 1100    PT Stop Time 1143    PT Time Calculation (min) 43 min    Activity Tolerance Patient tolerated treatment well    Behavior During Therapy [facility] for tasks assessed/performed           Past Medical History:  Diagnosis Date  . Acute cystitis without hematuria   . Acute diastolic CHF (congestive heart failure) ([place])   . Arthritis   . Dyspnea   . Dysrhythmia   . Fever of unknown origin 03/19/2017  . Hyperlipidemia   . Hypertension    denies at preop  . Multifocal pneumonia   . Neuromuscular disorder ([place])    neuropathy left arm and foot  . Osteopenia   . Paralysis ([place])    partial left side from CVA   . Persistent atrial fibrillation ([place])   . PONV (postoperative nausea and vomiting)   . Pre-diabetes   . Stroke [facility]) 2013   hemmorahgic    Past Surgical History:  Procedure Laterality Date  . ANKLE SURGERY    . APPENDECTOMY    . CHOLECYSTECTOMY    . HERNIA REPAIR     Esophagus  . JOINT REPLACEMENT     total- right partial- left  . MASTECTOMY PARTIAL / LUMPECTOMY  2012   left  . ORIF ANKLE FRACTURE Left 07/20/2018   Procedure: OPEN REDUCTION INTERNAL FIXATION (ORIF) ANKLE FRACTURE;  Surgeon: Wylene Simmer, MD;  Location: [place];  Service: Orthopedics;  Laterality: Left;  . TOTAL KNEE ARTHROPLASTY Left 01/27/2019   Procedure: TOTAL KNEE ARTHROPLASTY;  Surgeon: Vickey Huger, MD;  [facility];  Service: Orthopedics;  Laterality: Left;    There were no vitals filed for this visit.    Subjective Assessment - 12/06/20 1102    Subjective  Shoulder hurting, no big issues at home                          Objective measurements completed on examination: See above findings.       [facility] Adult PT Treatment/Exercise - 12/06/20 0001      Ambulation/Gait   Gait Comments SBA x 120 ' x 2, then 60' x 1.  Very fatigued at the end      High Level Balance   High Level Balance Comments standing with walker, picking up 1# cuff weights with left hand, swithcing ot the right and throwing, would lose balance requiring mod A to correct, 4" toe touches      Knee/Hip Exercises: Aerobic   Nustep Level 6 x 7.5 minutes      Knee/Hip Exercises: Standing   Hip Flexion Both;2 sets;10 reps    Hip Flexion Limitations 2#    Hip Abduction Both;1 set;10 reps    Abduction Limitations 2#      Knee/Hip Exercises: Supine   Bridges with Ball Squeeze 2 sets;10 reps    Other Supine Knee/Hip Exercises hip adduction, letting left fallout and then her bringing it back to adduction, 2x10 partial sit ups, used sliding board  for more active abduction ad adduction                    PT Short Term Goals - 06/18/20 1105      PT SHORT TERM GOAL #1   Title independent with initial HEP    Status Partially Met             PT Long Term Goals - 12/03/20 1151      PT LONG TERM GOAL #3   Title walk 100 feet with SBA using the FWW    Status Achieved                  Plan - 12/06/20 1146    Clinical Impression Statement Caregive present today so we started the education on the HEP and the walking.  We are planning on them having the ability to do at home or go to a small gym  to do HEP safely and try to maintain the gains that she has made, the caregiver reports that back in the fall she was unable to shower the patient due to the inability to be safe with trasnfers, all bathing and dressing took place in the bed.  She is now saely trandferring to a shower chair without the ankle brace and doing so safely    PT Next Visit  Plan continue to work on what she will do when she is discharged    Consulted and Agree with Plan of Care Patient           Patient will benefit from skilled therapeutic intervention in order to improve the following deficits and impairments:  Abnormal gait,Cardiopulmonary status limiting activity,Decreased activity tolerance,Decreased balance,Decreased mobility,Decreased strength,Decreased coordination,Decreased range of motion,Difficulty walking,Increased muscle spasms,Pain  Visit Diagnosis: Ataxia  Hemiplegia and hemiparesis following cerebral infarction affecting left non-dominant side (HCC)  Muscle weakness (generalized)  Other lack of coordination  Difficulty in walking, not elsewhere classified  Late effects of CVA (cerebrovascular accident)     Problem List Patient Active Problem List   Diagnosis Date Noted  . Orthostatic hypotension 10/15/2020  . Presbycusis of both ears 03/08/2020  . Mixed stress and urge urinary incontinence 12/02/2019  . Macrocytosis 12/01/2019  . Nutritional anemia 12/01/2019  . S/P total knee replacement 01/27/2019  . Recurrent left knee instability 07/05/2018  . Respiratory failure with hypoxia ([place]) 08/30/2017  . Hypoxemia   . Heart failure with preserved ejection fraction ([place]), Grade 3 diastolic dysfunction 42/87/6811  . PAF (paroxysmal atrial fibrillation) ([place])   . Dyspnea 03/19/2017  . Encounter for preventive health examination 02/17/2016  . Sensorineural hearing loss (SNHL), bilateral 01/26/2016  . Morbid obesity ([place]) 06/17/2015  . Hypomagnesemia 04/24/2014  . Hemiparesis affecting left side as late effect of cerebrovascular accident ([place]) 04/24/2014  . Nontraumatic cerebral hemorrhage ([place]) 04/30/2012  . DM (diabetes mellitus) with complications ([place]) 57/26/2035  . OSTEOPENIA 01/21/2009  . UNSPECIFIED VITAMIN D DEFICIENCY 11/19/2007  . HYPERCHOLESTEROLEMIA 10/25/2006  . GASTROESOPHAGEAL REFLUX, NO ESOPHAGITIS 10/25/2006  .  DIVERTICULOSIS OF COLON 10/25/2006  . Osteoarthritis 10/25/2006  . CERVICAL SPINE DISORDER, NOS 10/25/2006    Sumner Boast., PT 12/06/2020, 11:50 AM  [place]. [place], [place], [address] Phone: [phone]   Fax:  [phone]  Name: LEIANNE CALLINS MRN: [id number] Date of Birth: 07-24-1941

## 2020-12-06 NOTE — Therapy (Signed)
[place]. [place], [place], [address] Phone: [phone]   Fax:  [phone]  Occupational Therapy Treatment  Patient Details  Name: Karen Jackson MRN: [id number] Date of Birth: 01/31/1941 Referring Provider (OT): Madison Hickman   Encounter Date: 12/06/2020   OT End of Session - 12/06/20 1028    Visit Number 23    Number of Visits 25    Date for OT Re-Evaluation 12/07/20    Authorization Type Medicare    Progress Note Due on Visit 13    OT Start Time 1015    OT Stop Time 1058    OT Time Calculation (min) 43 min    Activity Tolerance Patient tolerated treatment well    Behavior During Therapy [facility] for tasks assessed/performed           Past Medical History:  Diagnosis Date  . Acute cystitis without hematuria   . Acute diastolic CHF (congestive heart failure) ([place])   . Arthritis   . Dyspnea   . Dysrhythmia   . Fever of unknown origin 03/19/2017  . Hyperlipidemia   . Hypertension    denies at preop  . Multifocal pneumonia   . Neuromuscular disorder ([place])    neuropathy left arm and foot  . Osteopenia   . Paralysis ([place])    partial left side from CVA   . Persistent atrial fibrillation ([place])   . PONV (postoperative nausea and vomiting)   . Pre-diabetes   . Stroke [facility]) 2013   hemmorahgic    Past Surgical History:  Procedure Laterality Date  . ANKLE SURGERY    . APPENDECTOMY    . CHOLECYSTECTOMY    . HERNIA REPAIR     Esophagus  . JOINT REPLACEMENT     total- right partial- left  . MASTECTOMY PARTIAL / LUMPECTOMY  2012   left  . ORIF ANKLE FRACTURE Left 07/20/2018   Procedure: OPEN REDUCTION INTERNAL FIXATION (ORIF) ANKLE FRACTURE;  Surgeon: Wylene Simmer, MD;  Location: [place];  Service: Orthopedics;  Laterality: Left;  . TOTAL KNEE ARTHROPLASTY Left 01/27/2019   Procedure: TOTAL KNEE ARTHROPLASTY;  Surgeon: Vickey Huger, MD;  [facility];  Service: Orthopedics;  Laterality: Left;     There were no vitals filed for this visit.   Subjective Assessment - 12/06/20 1256    Subjective  "I found the lidocaine patches my doctor recommended and they've been really helping"    Pertinent History CVA with L hemiparesis (2013), OA, HTN, CHF, pre-diabetes    Limitations Pain in LUE, impaired sensation, decreased strength and coordination, decreased PROM/AROM    Patient Stated Goals Improve functional use of LUE, decrease pain    Currently in Pain? Yes    Pain Score 3     Pain Location Shoulder    Pain Orientation Left    Pain Descriptors / Indicators Aching;Pins and needles    Pain Type Chronic pain            OT Treatments/Exercises (OP) - 12/06/20 1308      Modalities   Modalities Moist Heat   Small heat pack place low on shoulder due to med patch; Grasp and release activity completed during moist heat therapy     Moist Heat Therapy   Number Minutes Moist Heat 10 Minutes    Moist Heat Location Shoulder   Left     Splinting   Splinting OT donned LUE brace for shoulder support at start of session  Therapeutic Activities: - Pt instructed to grasp small balls w/ L hand and place them on cones positioned to facilitate different reach patterns. Then, pt used RUE to throw balls as targets to facilitate eye-hand coordination and GM control. Pt occasionally required cueing to slow movement down and re-adjust L hand to accurately target cones for functional grasp/release of balls.    OT Education - 12/06/20 1259    Education Details Education provided on precaution of not placing heat modalities directly over medication patches    Person(s) Educated Patient    Methods Explanation    Comprehension Verbalized understanding            OT Short Term Goals - 11/15/20 1340      OT SHORT TERM GOAL #1   Title Pt will doff open-front jacket with Min A at least 75% of the time    Baseline Mod A to doff zip-up jacket    Time 4    Period Weeks    Status On-going     Target Date 12/02/20      OT SHORT TERM GOAL #2   Title Pt will thread LUE into open-front jacket w/ Mod I in at least 1/2 trials    Baseline Max A to don zip-up jacket    Time 4    Period Weeks    Status On-going   11/01/20 - pt reports no pain ambulating w/ RW at home when she remembers to relax her grip on the walker handle     OT SHORT TERM GOAL #3   Title Per self-report, pt will report pain less than 3/10 in LUE when ambulating with RW at home at least 75% of the time.    Baseline Pt reports pain in LUE when ambulating with RW in the home.    Time 3    Period Weeks    Status Achieved            OT Long Term Goals - 12/06/20 1305      OT LONG TERM GOAL #1   Title Pt will demonstrate/verbalize understanding of compensation and joint protection strategies to decrease pain and increase independence during ADLs.    Baseline Currently Mod A with dressing; participation in ADLs limited by pain    Time 6    Period Weeks    Status Achieved   10/25/20 - verbalized understanding of joint protection strategies and states comfort w/ instructing caregivers during ADLs     OT LONG TERM GOAL #2   Title Pt will improve functional FM coordination of as evidenced by ability to place >50% of pegs into pegboard with LUE.    Baseline Unable to place small pegs with LUE during eval.    Time 6    Period Weeks    Status Achieved   10/05/20 - placed ~75% of pegs into pegboard without dropping     OT LONG TERM GOAL #3   Title Pt will be independent with HEP for BUE strengthening and [place] to improve independence with functional tasks.    Baseline Pt reports no HEP for UE strength/coordination.    Time 6    Period Weeks    Status On-going      OT LONG TERM GOAL #4   Title Pt will improve functional grasp of L hand as evidenced by increasing grip strength by >9 lbs.    Baseline LUE = 11 lbs; RUE = 33 lbs    Time 6    Period Weeks  Status Achieved   10/18/20 - LUE = 21 lbs; RUE = 36 lbs      OT LONG TERM GOAL #5   Title Pt will be able to deal standard-sized playing cards one-by-one using bilateral thumbs independently at least 80% of the time    Baseline Unable to deal cards using isolated thumb    Time 6    Period Weeks    Status Achieved   12/06/20 - able to deal cards with thumbs 100% of the time w/out pain           Plan - 12/06/20 1033    Clinical Impression Statement Pt participated in therapeutic activities focusing on coordination of gross grasp and eye-hand coordination with pt demonstrating improved gross motor coordination, particularly when providing proximal support at the wrist. Pt continuing to work toward effective bilateral coordination and functional grasp and release and reports improvements w/ strategies learned in OT for donning/doffing zip-up jacket.    OT Occupational Profile and History Problem Focused Assessment - Including review of records relating to presenting problem    Occupational performance deficits (Please refer to evaluation for details): ADL's;IADL's    Body Structure / Function / Physical Skills ADL;UE functional use;Balance;Body mechanics;Pain;FMC;Proprioception;ROM;Coordination;GMC;Sensation;Strength;Dexterity    Rehab Potential Fair    Clinical Decision Making Several treatment options, min-mod task modification necessary    Comorbidities Affecting Occupational Performance: May have comorbidities impacting occupational performance    Modification or Assistance to Complete Evaluation  Min-Moderate modification of tasks or assist with assess necessary to complete eval    OT Frequency 1x / week    OT Duration 6 weeks    OT Treatment/Interventions Self-care/ADL training;Ultrasound;Compression bandaging;DME and/or AE instruction;Patient/family education;Passive range of motion;Electrical Stimulation;Splinting;Moist Heat;Therapeutic exercise;Manual Therapy;Therapeutic activities;Neuromuscular education;Energy  conservation;Iontophoresis;Cryotherapy;Functional Mobility Training;Aquatic Therapy    Plan Core strengthening and weight-shifting    Consulted and Agree with Plan of Care Patient           Patient will benefit from skilled therapeutic intervention in order to improve the following deficits and impairments:   Body Structure / Function / Physical Skills: ADL,UE functional use,Balance,Body mechanics,Pain,FMC,Proprioception,ROM,Coordination,GMC,Sensation,Strength,Dexterity       Visit Diagnosis: Ataxia  Hemiplegia and hemiparesis following cerebral infarction affecting left non-dominant side (HCC)  Muscle weakness (generalized)  Other lack of coordination    Problem List Patient Active Problem List   Diagnosis Date Noted  . Orthostatic hypotension 10/15/2020  . Presbycusis of both ears 03/08/2020  . Mixed stress and urge urinary incontinence 12/02/2019  . Macrocytosis 12/01/2019  . Nutritional anemia 12/01/2019  . S/P total knee replacement 01/27/2019  . Recurrent left knee instability 07/05/2018  . Respiratory failure with hypoxia ([place]) 08/30/2017  . Hypoxemia   . Heart failure with preserved ejection fraction ([place]), Grade 3 diastolic dysfunction 96/28/3662  . PAF (paroxysmal atrial fibrillation) ([place])   . Dyspnea 03/19/2017  . Encounter for preventive health examination 02/17/2016  . Sensorineural hearing loss (SNHL), bilateral 01/26/2016  . Morbid obesity ([place]) 06/17/2015  . Hypomagnesemia 04/24/2014  . Hemiparesis affecting left side as late effect of cerebrovascular accident ([place]) 04/24/2014  . Nontraumatic cerebral hemorrhage ([place]) 04/30/2012  . DM (diabetes mellitus) with complications ([place]) 94/76/5465  . OSTEOPENIA 01/21/2009  . UNSPECIFIED VITAMIN D DEFICIENCY 11/19/2007  . HYPERCHOLESTEROLEMIA 10/25/2006  . GASTROESOPHAGEAL REFLUX, NO ESOPHAGITIS 10/25/2006  . DIVERTICULOSIS OF COLON 10/25/2006  . Osteoarthritis 10/25/2006  . CERVICAL SPINE DISORDER, NOS  10/25/2006     Kathrine Cords, OTR/L, MSOT 12/06/2020, 1:15 PM  [facility]  [place]. [place], [place], [address] Phone: [phone]   Fax:  [phone]  Name: ALANIS CLIFT MRN: [id number] Date of Birth: 01/10/1941

## 2020-12-09 ENCOUNTER — Encounter: Payer: Self-pay | Admitting: Physical Therapy

## 2020-12-09 ENCOUNTER — Ambulatory Visit: Payer: Medicare Other | Admitting: Physical Therapy

## 2020-12-09 ENCOUNTER — Other Ambulatory Visit: Payer: Self-pay

## 2020-12-09 DIAGNOSIS — I69354 Hemiplegia and hemiparesis following cerebral infarction affecting left non-dominant side: Secondary | ICD-10-CM | POA: Diagnosis not present

## 2020-12-09 DIAGNOSIS — R270 Ataxia, unspecified: Secondary | ICD-10-CM

## 2020-12-09 DIAGNOSIS — I6990 Unspecified sequelae of unspecified cerebrovascular disease: Secondary | ICD-10-CM

## 2020-12-09 DIAGNOSIS — R278 Other lack of coordination: Secondary | ICD-10-CM

## 2020-12-09 DIAGNOSIS — M6281 Muscle weakness (generalized): Secondary | ICD-10-CM

## 2020-12-09 DIAGNOSIS — R262 Difficulty in walking, not elsewhere classified: Secondary | ICD-10-CM | POA: Diagnosis not present

## 2020-12-09 NOTE — Therapy (Signed)
[place]. [place], [place], [address] Phone: [phone]   Fax:  [phone]  Physical Therapy Treatment  Patient Details  Name: Karen Jackson MRN: [id number] Date of Birth: Jul 11, 1941 Referring Provider (PT): Hensel   Encounter Date: 12/09/2020   PT End of Session - 12/09/20 1145    Visit Number 48    Date for PT Re-Evaluation 12/17/20    PT Start Time 1100    PT Stop Time 1144    PT Time Calculation (min) 44 min    Activity Tolerance Patient tolerated treatment well    Behavior During Therapy [facility] for tasks assessed/performed           Past Medical History:  Diagnosis Date  . Acute cystitis without hematuria   . Acute diastolic CHF (congestive heart failure) ([place])   . Arthritis   . Dyspnea   . Dysrhythmia   . Fever of unknown origin 03/19/2017  . Hyperlipidemia   . Hypertension    denies at preop  . Multifocal pneumonia   . Neuromuscular disorder ([place])    neuropathy left arm and foot  . Osteopenia   . Paralysis ([place])    partial left side from CVA   . Persistent atrial fibrillation ([place])   . PONV (postoperative nausea and vomiting)   . Pre-diabetes   . Stroke [facility]) 2013   hemmorahgic    Past Surgical History:  Procedure Laterality Date  . ANKLE SURGERY    . APPENDECTOMY    . CHOLECYSTECTOMY    . HERNIA REPAIR     Esophagus  . JOINT REPLACEMENT     total- right partial- left  . MASTECTOMY PARTIAL / LUMPECTOMY  2012   left  . ORIF ANKLE FRACTURE Left 07/20/2018   Procedure: OPEN REDUCTION INTERNAL FIXATION (ORIF) ANKLE FRACTURE;  Surgeon: Wylene Simmer, MD;  Location: [place];  Service: Orthopedics;  Laterality: Left;  . TOTAL KNEE ARTHROPLASTY Left 01/27/2019   Procedure: TOTAL KNEE ARTHROPLASTY;  Surgeon: Vickey Huger, MD;  [facility];  Service: Orthopedics;  Laterality: Left;    There were no vitals filed for this visit.   Subjective Assessment - 12/09/20 1112    Subjective I  think I may have a plan, I may need you to educate two people    Currently in Pain? Yes    Pain Score 4     Pain Location Shoulder    Pain Orientation Left    Aggravating Factors  use                             [facility] Adult PT Treatment/Exercise - 12/09/20 0001      Ambulation/Gait   Gait Comments SBA with FWW 120' x 1, 110'x1 and 100' x 1.  She is doing much better with her turns and transfers, much safer      High Level Balance   High Level Balance Comments standing with walker, picking up 1# cuff weights with left hand, swithcing ot the right and throwing, would lose balance requiring mod A to correct, 4" toe touches      Knee/Hip Exercises: Aerobic   Nustep level 6 x 8.5 minutes      Knee/Hip Exercises: Standing   Hip Flexion Both;2 sets;10 reps    Hip Flexion Limitations 2#    Hip Abduction Both;1 set;10 reps    Abduction Limitations 2#      Knee/Hip Exercises:  Seated   Long Arc Quad Strengthening;Both;3 sets;10 reps;Weights    Long Arc Quad Weight 5 lbs.    Other Seated Knee/Hip Exercises green tband ankle PF/DF, left ankle on sit fit motions, hip adduction red tband    Hamstring Curl Both;3 sets;10 reps    Hamstring Limitations blue tband cues for ROM                    PT Short Term Goals - 06/18/20 1105      PT SHORT TERM GOAL #1   Title independent with initial HEP    Status Partially Met             PT Long Term Goals - 12/09/20 1151      PT LONG TERM GOAL #2   Title increased right LE strength to 4-/5    Status Achieved                 Plan - 12/09/20 1146    Clinical Impression Statement Patient tells me that she will probably have two different caregives that will need education.  Patient and I have been working on plan of what machines and exercises to do in a small gym setting and at home.  We have started the process of education with one care give and will need to assure safety and then educate the other.  She  is practicing much safer transfers and getting fully turned before sitting, when fatigued she will not get fully turned and plop down but this is becoming less and less.           Patient will benefit from skilled therapeutic intervention in order to improve the following deficits and impairments:  Abnormal gait,Cardiopulmonary status limiting activity,Decreased activity tolerance,Decreased balance,Decreased mobility,Decreased strength,Decreased coordination,Decreased range of motion,Difficulty walking,Increased muscle spasms,Pain  Visit Diagnosis: Ataxia  Hemiplegia and hemiparesis following cerebral infarction affecting left non-dominant side (HCC)  Muscle weakness (generalized)  Other lack of coordination  Difficulty in walking, not elsewhere classified  Late effects of CVA (cerebrovascular accident)     Problem List Patient Active Problem List   Diagnosis Date Noted  . Orthostatic hypotension 10/15/2020  . Presbycusis of both ears 03/08/2020  . Mixed stress and urge urinary incontinence 12/02/2019  . Macrocytosis 12/01/2019  . Nutritional anemia 12/01/2019  . S/P total knee replacement 01/27/2019  . Recurrent left knee instability 07/05/2018  . Respiratory failure with hypoxia ([place]) 08/30/2017  . Hypoxemia   . Heart failure with preserved ejection fraction ([place]), Grade 3 diastolic dysfunction 09/32/3557  . PAF (paroxysmal atrial fibrillation) ([place])   . Dyspnea 03/19/2017  . Encounter for preventive health examination 02/17/2016  . Sensorineural hearing loss (SNHL), bilateral 01/26/2016  . Morbid obesity ([place]) 06/17/2015  . Hypomagnesemia 04/24/2014  . Hemiparesis affecting left side as late effect of cerebrovascular accident ([place]) 04/24/2014  . Nontraumatic cerebral hemorrhage ([place]) 04/30/2012  . DM (diabetes mellitus) with complications ([place]) 32/20/2542  . OSTEOPENIA 01/21/2009  . UNSPECIFIED VITAMIN D DEFICIENCY 11/19/2007  . HYPERCHOLESTEROLEMIA 10/25/2006  .  GASTROESOPHAGEAL REFLUX, NO ESOPHAGITIS 10/25/2006  . DIVERTICULOSIS OF COLON 10/25/2006  . Osteoarthritis 10/25/2006  . CERVICAL SPINE DISORDER, NOS 10/25/2006    Sumner Boast., PT 12/09/2020, 11:52 AM  [place]. [place], [place], [address] Phone: [phone]   Fax:  [phone]  Name: PERRIS TRIPATHI MRN: [id number] Date of Birth: [birth year]

## 2020-12-15 ENCOUNTER — Other Ambulatory Visit: Payer: Self-pay

## 2020-12-15 ENCOUNTER — Encounter: Payer: Self-pay | Admitting: Physical Therapy

## 2020-12-15 ENCOUNTER — Ambulatory Visit: Payer: Medicare Other | Admitting: Occupational Therapy

## 2020-12-15 ENCOUNTER — Ambulatory Visit: Payer: Medicare Other | Admitting: Physical Therapy

## 2020-12-15 DIAGNOSIS — M6281 Muscle weakness (generalized): Secondary | ICD-10-CM

## 2020-12-15 DIAGNOSIS — I6990 Unspecified sequelae of unspecified cerebrovascular disease: Secondary | ICD-10-CM | POA: Diagnosis not present

## 2020-12-15 DIAGNOSIS — R270 Ataxia, unspecified: Secondary | ICD-10-CM | POA: Diagnosis not present

## 2020-12-15 DIAGNOSIS — R278 Other lack of coordination: Secondary | ICD-10-CM

## 2020-12-15 DIAGNOSIS — R262 Difficulty in walking, not elsewhere classified: Secondary | ICD-10-CM | POA: Diagnosis not present

## 2020-12-15 DIAGNOSIS — I69354 Hemiplegia and hemiparesis following cerebral infarction affecting left non-dominant side: Secondary | ICD-10-CM

## 2020-12-15 NOTE — Therapy (Signed)
[place]. [place], [place], [address] Phone: [phone]   Fax:  [phone]  Physical Therapy Treatment  Patient Details  Name: Karen Jackson MRN: [id number] Date of Birth: August 01, 1941 Referring Provider (PT): Hensel   Encounter Date: 12/15/2020   PT End of Session - 12/15/20 1143    Visit Number 49    Date for PT Re-Evaluation 12/17/20    PT Start Time 1059    PT Stop Time 1142    PT Time Calculation (min) 43 min    Activity Tolerance Patient tolerated treatment well    Behavior During Therapy [facility] for tasks assessed/performed           Past Medical History:  Diagnosis Date  . Acute cystitis without hematuria   . Acute diastolic CHF (congestive heart failure) ([place])   . Arthritis   . Dyspnea   . Dysrhythmia   . Fever of unknown origin 03/19/2017  . Hyperlipidemia   . Hypertension    denies at preop  . Multifocal pneumonia   . Neuromuscular disorder ([place])    neuropathy left arm and foot  . Osteopenia   . Paralysis ([place])    partial left side from CVA   . Persistent atrial fibrillation ([place])   . PONV (postoperative nausea and vomiting)   . Pre-diabetes   . Stroke [facility]) 2013   hemmorahgic    Past Surgical History:  Procedure Laterality Date  . ANKLE SURGERY    . APPENDECTOMY    . CHOLECYSTECTOMY    . HERNIA REPAIR     Esophagus  . JOINT REPLACEMENT     total- right partial- left  . MASTECTOMY PARTIAL / LUMPECTOMY  2012   left  . ORIF ANKLE FRACTURE Left 07/20/2018   Procedure: OPEN REDUCTION INTERNAL FIXATION (ORIF) ANKLE FRACTURE;  Surgeon: Wylene Simmer, MD;  Location: [place];  Service: Orthopedics;  Laterality: Left;  . TOTAL KNEE ARTHROPLASTY Left 01/27/2019   Procedure: TOTAL KNEE ARTHROPLASTY;  Surgeon: Vickey Huger, MD;  [facility];  Service: Orthopedics;  Laterality: Left;    There were no vitals filed for this visit.   Subjective Assessment - 12/15/20 1115    Subjective The  caregiver was not here today, so we did not get to finalize the education                             [facility] Adult PT Treatment/Exercise - 12/15/20 0001      High Level Balance   High Level Balance Comments standing with walker, picking up 1# cuff weights with left hand, swithcing ot the right and throwing, would lose balance requiring mod A to correct, 4" toe touches      Knee/Hip Exercises: Aerobic   Nustep level 6 working on getting 500 steps, cues for left hip falling out      Knee/Hip Exercises: Seated   Long Arc Quad Strengthening;Both;3 sets;10 reps;Weights    Long Arc Quad Weight 5 lbs.    Other Seated Knee/Hip Exercises green tband ankle PF/DF, left ankle on sit fit motions, hip adduction red tband    Marching Left;2 sets;10 reps    Marching Weights 5 lbs.    Hamstring Curl Both;3 sets;10 reps    Hamstring Limitations blue tband cues for ROM      Knee/Hip Exercises: Supine   Bridges with Diona Foley Squeeze 2 sets;10 reps    Straight Leg Raises Left;3  sets;10 reps    Other Supine Knee/Hip Exercises feet on ball K2C, trunk rotation, small bridges and isometric abs.                    PT Short Term Goals - 06/18/20 1105      PT SHORT TERM GOAL #1   Title independent with initial HEP    Status Partially Met             PT Long Term Goals - 12/09/20 1151      PT LONG TERM GOAL #2   Title increased right LE strength to 4-/5    Status Achieved                 Plan - 12/15/20 1143    Clinical Impression Statement Patient had a caregiver that is not her normal today, she reports that she is trying to get the main one to come in next visit for hte training.  She is doing better with the transfers and making a full turn prior to sitting and causing less stress on her knee and hip and is safer.  She continues to have the left shoulder pain and has been seeing OT for this, however it does limit her ability to push walker at times due to pain and  popping    PT Next Visit Plan caregiver education and D/C next visit    Consulted and Agree with Plan of Care Patient           Patient will benefit from skilled therapeutic intervention in order to improve the following deficits and impairments:  Abnormal gait,Cardiopulmonary status limiting activity,Decreased activity tolerance,Decreased balance,Decreased mobility,Decreased strength,Decreased coordination,Decreased range of motion,Difficulty walking,Increased muscle spasms,Pain  Visit Diagnosis: Hemiplegia and hemiparesis following cerebral infarction affecting left non-dominant side (HCC)  Muscle weakness (generalized)  Difficulty in walking, not elsewhere classified  Late effects of CVA (cerebrovascular accident)  Ataxia     Problem List Patient Active Problem List   Diagnosis Date Noted  . Orthostatic hypotension 10/15/2020  . Presbycusis of both ears 03/08/2020  . Mixed stress and urge urinary incontinence 12/02/2019  . Macrocytosis 12/01/2019  . Nutritional anemia 12/01/2019  . S/P total knee replacement 01/27/2019  . Recurrent left knee instability 07/05/2018  . Respiratory failure with hypoxia ([place]) 08/30/2017  . Hypoxemia   . Heart failure with preserved ejection fraction ([place]), Grade 3 diastolic dysfunction 00/86/7619  . PAF (paroxysmal atrial fibrillation) ([place])   . Dyspnea 03/19/2017  . Encounter for preventive health examination 02/17/2016  . Sensorineural hearing loss (SNHL), bilateral 01/26/2016  . Morbid obesity ([place]) 06/17/2015  . Hypomagnesemia 04/24/2014  . Hemiparesis affecting left side as late effect of cerebrovascular accident ([place]) 04/24/2014  . Nontraumatic cerebral hemorrhage ([place]) 04/30/2012  . DM (diabetes mellitus) with complications ([place]) 50/93/2671  . OSTEOPENIA 01/21/2009  . UNSPECIFIED VITAMIN D DEFICIENCY 11/19/2007  . HYPERCHOLESTEROLEMIA 10/25/2006  . GASTROESOPHAGEAL REFLUX, NO ESOPHAGITIS 10/25/2006  . DIVERTICULOSIS OF COLON  10/25/2006  . Osteoarthritis 10/25/2006  . CERVICAL SPINE DISORDER, NOS 10/25/2006    Sumner Boast., PT 12/15/2020, 11:47 AM  [place]. [place], [place], [address] Phone: [phone]   Fax:  [phone]  Name: TYONNA TALERICO MRN: [id number] Date of Birth: [birth year]

## 2020-12-15 NOTE — Therapy (Signed)
[employer]. [place], [place], [address] Phone: [phone]   Fax:  [phone]  Occupational Therapy Treatment  Patient Details  Name: Karen Jackson MRN: [id number] Date of Birth: Sep 23, 1940 Referring Provider (OT): Madison Hickman   Encounter Date: 12/15/2020   OT End of Session - 12/15/20 1425    Visit Number 24    Number of Visits 28    Date for OT Re-Evaluation 01/14/21    Authorization Type Medicare    Progress Note Due on Visit 81    OT Start Time 1017    OT Stop Time 1058    OT Time Calculation (min) 41 min    Activity Tolerance Patient tolerated treatment well    Behavior During Therapy [facility] for tasks assessed/performed           Past Medical History:  Diagnosis Date  . Acute cystitis without hematuria   . Acute diastolic CHF (congestive heart failure) ([place])   . Arthritis   . Dyspnea   . Dysrhythmia   . Fever of unknown origin 03/19/2017  . Hyperlipidemia   . Hypertension    denies at preop  . Multifocal pneumonia   . Neuromuscular disorder ([place])    neuropathy left arm and foot  . Osteopenia   . Paralysis ([place])    partial left side from CVA   . Persistent atrial fibrillation ([place])   . PONV (postoperative nausea and vomiting)   . Pre-diabetes   . Stroke [facility]) 2013   hemmorahgic    Past Surgical History:  Procedure Laterality Date  . ANKLE SURGERY    . APPENDECTOMY    . CHOLECYSTECTOMY    . HERNIA REPAIR     Esophagus  . JOINT REPLACEMENT     total- right partial- left  . MASTECTOMY PARTIAL / LUMPECTOMY  2012   left  . ORIF ANKLE FRACTURE Left 07/20/2018   Procedure: OPEN REDUCTION INTERNAL FIXATION (ORIF) ANKLE FRACTURE;  Surgeon: Wylene Simmer, MD;  Location: [place];  Service: Orthopedics;  Laterality: Left;  . TOTAL KNEE ARTHROPLASTY Left 01/27/2019   Procedure: TOTAL KNEE ARTHROPLASTY;  Surgeon: Vickey Huger, MD;  Location[facility];  Service: Orthopedics;  Laterality: Left;     There were no vitals filed for this visit.   Subjective Assessment - 12/15/20 1029    Subjective  "I didn't put my patch on today so that I can get my heat"    Pertinent History CVA with L hemiparesis (2013), OA, HTN, CHF, pre-diabetes    Limitations Pain in LUE, impaired sensation, decreased strength and coordination, decreased PROM/AROM    Patient Stated Goals Improve functional use of LUE, decrease pain    Currently in Pain? Yes    Pain Score 3     Pain Location Shoulder    Pain Orientation Left    Pain Descriptors / Indicators Aching;Pins and needles    Pain Type Chronic pain             OT Treatments/Exercises (OP) - 12/15/20 1035      ADLs   UB Dressing Max A doffing zip-up jacket      Shoulder Exercises: Seated   Other Seated Exercises Towel slides at tabletop height; 2 sets of 10 reps completed in forward flexion, ext rotation, and int rotation. OT provided support at L shoulder to decrease discomfort during external rotation exercises with positive results      Hand Exercises   Other Hand Exercises Go Fish  played with pt to improve [facility] and in-hand manipulation of L hand; pt instructed to use left hand when dealing/pushing cards out of her hand      Neurological Re-education Exercises   Towel Slides --      Modalities   Modalities Moist Heat   Towel slides completed during moist heat therapy     Moist Heat Therapy   Number Minutes Moist Heat 15 Minutes    Moist Heat Location Shoulder   Left     Splinting   Splinting OT donned LUE brace for shoulder support at start of session      Manual Therapy   Manual Therapy Passive ROM    Passive ROM Gentle therapist-facilitated PROM of L shoulder flexion and abduction; able to achieve increased range without L shoulder pain compared w/ AROM             OT Short Term Goals - 12/15/20 1100      OT SHORT TERM GOAL #1   Title Pt will doff open-front jacket with Min A at least 75% of the time    Baseline Mod A to  doff zip-up jacket    Time 3    Period Weeks    Status On-going    Target Date 01/05/21      OT SHORT TERM GOAL #2   Title Pt will thread LUE into open-front jacket w/ Mod I in at least 1/2 trials    Baseline Max A to don zip-up jacket    Time 3    Period Weeks    Status On-going    Target Date 01/05/21      OT SHORT TERM GOAL #3   Title Per self-report, pt will report pain less than 3/10 in LUE when ambulating with RW at home at least 75% of the time.    Baseline Pt reports pain in LUE when ambulating with RW in the home.    Time 3    Period Weeks    Status Achieved   11/01/20 - pt reports no pain ambulating w/ RW at home when she remembers to relax her grip on the walker handle            OT Long Term Goals - 12/06/20 1305      OT LONG TERM GOAL #1   Title Pt will demonstrate/verbalize understanding of compensation and joint protection strategies to decrease pain and increase independence during ADLs.    Baseline Currently Mod A with dressing; participation in ADLs limited by pain    Time 6    Period Weeks    Status Achieved   10/25/20 - verbalized understanding of joint protection strategies and states comfort w/ instructing caregivers during ADLs     OT LONG TERM GOAL #2   Title Pt will improve functional FM coordination of as evidenced by ability to place >50% of pegs into pegboard with LUE.    Baseline Unable to place small pegs with LUE during eval.    Time 6    Period Weeks    Status Achieved   10/05/20 - placed ~75% of pegs into pegboard without dropping     OT LONG TERM GOAL #3   Title Pt will be independent with HEP for BUE strengthening and [place] to improve independence with functional tasks.    Baseline Pt reports no HEP for UE strength/coordination.    Time 6    Period Weeks    Status On-going      OT LONG  TERM GOAL #4   Title Pt will improve functional grasp of L hand as evidenced by increasing grip strength by >9 lbs.    Baseline LUE = 11 lbs; RUE = 33 lbs     Time 6    Period Weeks    Status Achieved   10/18/20 - LUE = 21 lbs; RUE = 36 lbs     OT LONG TERM GOAL #5   Title Pt will be able to deal standard-sized playing cards one-by-one using bilateral thumbs independently at least 80% of the time    Baseline Unable to deal cards using isolated thumb    Time 6    Period Weeks    Status Achieved   12/06/20 - able to deal cards with thumbs 100% of the time w/out pain            Plan - 12/15/20 1100    Clinical Impression Statement Pt reported decreased shoulder pain this session, which facilitated slightly improved L shoulder ROM; pt able to complete multiple repetitions of towel slides at tabletop within tolerable level of pain. Due to this, OT facilitated gentle PROM of L shoulder flexion and abduction, during which pt reported no pain and was able to achieve approx. 60 degrees of flexion and 70 degrees of abduction. Pt also exhibits continued L hand coordination improvement and will benefit from continued services to continue to address coordination, [place], generalized endurance, and functional UB dressing tasks.    OT Occupational Profile and History Problem Focused Assessment - Including review of records relating to presenting problem    Occupational performance deficits (Please refer to evaluation for details): ADL's;IADL's    Body Structure / Function / Physical Skills ADL;UE functional use;Balance;Body mechanics;Pain;FMC;Proprioception;ROM;Coordination;GMC;Sensation;Strength;Dexterity    Rehab Potential Fair    Clinical Decision Making Several treatment options, min-mod task modification necessary    Comorbidities Affecting Occupational Performance: May have comorbidities impacting occupational performance    Modification or Assistance to Complete Evaluation  Min-Moderate modification of tasks or assist with assess necessary to complete eval    OT Frequency 1x / week    OT Duration 6 weeks    OT Treatment/Interventions Self-care/ADL  training;Ultrasound;Compression bandaging;DME and/or AE instruction;Patient/family education;Passive range of motion;Electrical Stimulation;Splinting;Moist Heat;Therapeutic exercise;Manual Therapy;Therapeutic activities;Neuromuscular education;Energy conservation;Iontophoresis;Cryotherapy;Functional Mobility Training;Aquatic Therapy    Plan Core strengthening and weight-shifting; UB dressing    Consulted and Agree with Plan of Care Patient           Patient will benefit from skilled therapeutic intervention in order to improve the following deficits and impairments:   Body Structure / Function / Physical Skills: ADL,UE functional use,Balance,Body mechanics,Pain,FMC,Proprioception,ROM,Coordination,GMC,Sensation,Strength,Dexterity       Visit Diagnosis: Ataxia  Hemiplegia and hemiparesis following cerebral infarction affecting left non-dominant side (HCC)  Muscle weakness (generalized)  Other lack of coordination    Problem List Patient Active Problem List   Diagnosis Date Noted  . Orthostatic hypotension 10/15/2020  . Presbycusis of both ears 03/08/2020  . Mixed stress and urge urinary incontinence 12/02/2019  . Macrocytosis 12/01/2019  . Nutritional anemia 12/01/2019  . S/P total knee replacement 01/27/2019  . Recurrent left knee instability 07/05/2018  . Respiratory failure with hypoxia ([place]) 08/30/2017  . Hypoxemia   . Heart failure with preserved ejection fraction ([place]), Grade 3 diastolic dysfunction 09/38/1829  . PAF (paroxysmal atrial fibrillation) ([place])   . Dyspnea 03/19/2017  . Encounter for preventive health examination 02/17/2016  . Sensorineural hearing loss (SNHL), bilateral 01/26/2016  . Morbid obesity ([place]) 06/17/2015  .  Hypomagnesemia 04/24/2014  . Hemiparesis affecting left side as late effect of cerebrovascular accident ([place]) 04/24/2014  . Nontraumatic cerebral hemorrhage ([place]) 04/30/2012  . DM (diabetes mellitus) with complications ([place]) 81/09/5484  .  OSTEOPENIA 01/21/2009  . UNSPECIFIED VITAMIN D DEFICIENCY 11/19/2007  . HYPERCHOLESTEROLEMIA 10/25/2006  . GASTROESOPHAGEAL REFLUX, NO ESOPHAGITIS 10/25/2006  . DIVERTICULOSIS OF COLON 10/25/2006  . Osteoarthritis 10/25/2006  . CERVICAL SPINE DISORDER, NOS 10/25/2006     Kathrine Cords, OTR/L, MSOT 12/15/2020, 4:59 PM  [facility]. [place], [place], [address] Phone: [phone]   Fax:  [phone]  Name: Karen Jackson MRN: [id number] Date of Birth: Jul 11, 1941

## 2020-12-17 ENCOUNTER — Other Ambulatory Visit: Payer: Self-pay

## 2020-12-17 ENCOUNTER — Encounter: Payer: Self-pay | Admitting: Physical Therapy

## 2020-12-17 ENCOUNTER — Ambulatory Visit: Payer: Medicare Other | Admitting: Physical Therapy

## 2020-12-17 ENCOUNTER — Encounter: Payer: Self-pay | Admitting: Family Medicine

## 2020-12-17 DIAGNOSIS — I6990 Unspecified sequelae of unspecified cerebrovascular disease: Secondary | ICD-10-CM | POA: Diagnosis not present

## 2020-12-17 DIAGNOSIS — R278 Other lack of coordination: Secondary | ICD-10-CM | POA: Diagnosis not present

## 2020-12-17 DIAGNOSIS — I69354 Hemiplegia and hemiparesis following cerebral infarction affecting left non-dominant side: Secondary | ICD-10-CM

## 2020-12-17 DIAGNOSIS — R262 Difficulty in walking, not elsewhere classified: Secondary | ICD-10-CM | POA: Diagnosis not present

## 2020-12-17 DIAGNOSIS — M6281 Muscle weakness (generalized): Secondary | ICD-10-CM | POA: Diagnosis not present

## 2020-12-17 DIAGNOSIS — R270 Ataxia, unspecified: Secondary | ICD-10-CM | POA: Diagnosis not present

## 2020-12-17 NOTE — Therapy (Signed)
[facility]. [place], [place], [address] Phone: [phone]   Fax:  [phone] Progress Note Reporting Period 11/15/20 to 12/17/20 for visits 41-50 See note below for Objective Data and Assessment of Progress/Goals.      Physical Therapy Treatment  Patient Details  Name: Karen Jackson MRN: [id number] Date of Birth: 1941/07/21 Referring Provider (PT): [place]   Encounter Date: 12/17/2020   PT End of Session - 12/17/20 1053    Visit Number 76    PT Start Time 0927    PT Stop Time 1008    PT Time Calculation (min) 41 min    Activity Tolerance Patient tolerated treatment well    Behavior During Therapy [facility] for tasks assessed/performed           Past Medical History:  Diagnosis Date  . Acute cystitis without hematuria   . Acute diastolic CHF (congestive heart failure) ([place])   . Arthritis   . Dyspnea   . Dysrhythmia   . Fever of unknown origin 03/19/2017  . Hyperlipidemia   . Hypertension    denies at preop  . Multifocal pneumonia   . Neuromuscular disorder ([place])    neuropathy left arm and foot  . Osteopenia   . Paralysis ([place])    partial left side from CVA   . Persistent atrial fibrillation ([place])   . PONV (postoperative nausea and vomiting)   . Pre-diabetes   . Stroke [facility]) 2013   hemmorahgic    Past Surgical History:  Procedure Laterality Date  . ANKLE SURGERY    . APPENDECTOMY    . CHOLECYSTECTOMY    . HERNIA REPAIR     Esophagus  . JOINT REPLACEMENT     total- right partial- left  . MASTECTOMY PARTIAL / LUMPECTOMY  2012   left  . ORIF ANKLE FRACTURE Left 07/20/2018   Procedure: OPEN REDUCTION INTERNAL FIXATION (ORIF) ANKLE FRACTURE;  Surgeon: Wylene Simmer, MD;  Location: [place];  Service: Orthopedics;  Laterality: Left;  . TOTAL KNEE ARTHROPLASTY Left 01/27/2019   Procedure: TOTAL KNEE ARTHROPLASTY;  Surgeon: Vickey Huger, MD;  [facility];  Service: Orthopedics;  Laterality: Left;     There were no vitals filed for this visit.   Subjective Assessment - 12/17/20 0943    Subjective Patient again has a different caregiver and we are d/cing today, trying to get her to do stuff exercises at home and possibly coming in and using our gym for her to continue on without regressing              [facility] PT Assessment - 12/17/20 0001      Timed Up and Go Test   Normal TUG (seconds) 34                         [facility] Adult PT Treatment/Exercise - 12/17/20 0001      Ambulation/Gait   Gait Comments 3x100 feet supervision only      Knee/Hip Exercises: Aerobic   Nustep worked with this caregiver on how to set up nustep and had patient perform      Knee/Hip Exercises: Seated   Other Seated Knee/Hip Exercises green tband ankle PF/DF, left ankle on sit fit motions, hip adduction green tband    Hamstring Curl Both;3 sets;10 reps    Hamstring Limitations blue tband cues for ROM  PT Short Term Goals - 06/18/20 1105      PT SHORT TERM GOAL #1   Title independent with initial HEP    Status Partially Met             PT Long Term Goals - 12/17/20 0946      PT LONG TERM GOAL #1   Title walk with hand hold assist x 100 feet      PT LONG TERM GOAL #2   Title increased right LE strength to 4-/5    Status Achieved      PT LONG TERM GOAL #3   Title walk 100 feet with SBA using the FWW    Status Achieved      PT LONG TERM GOAL #4   Title transfer independently with set up    Status Partially Met      PT LONG TERM GOAL #5   Title decrease TUG to 39 seconds    Status Achieved                 Plan - 12/17/20 1053    Clinical Impression Statement Have continued to work with her caregivers on the machines so she can continue after D/C, she has done well with her safe transfers and with her walking now supervision only, the distance of 100 feet is easy for her now and can go up to about 125' but this really fatigues  her, shoulder pain and popping has been a hinderance at times,    PT Next Visit Plan we will D/C and have her try on her own with her caregive    Consulted and Agree with Plan of Care Patient           Patient will benefit from skilled therapeutic intervention in order to improve the following deficits and impairments:  Abnormal gait,Cardiopulmonary status limiting activity,Decreased activity tolerance,Decreased balance,Decreased mobility,Decreased strength,Decreased coordination,Decreased range of motion,Difficulty walking,Increased muscle spasms,Pain  Visit Diagnosis: Ataxia  Hemiplegia and hemiparesis following cerebral infarction affecting left non-dominant side (HCC)  Muscle weakness (generalized)  Other lack of coordination  Difficulty in walking, not elsewhere classified  Late effects of CVA (cerebrovascular accident)     Problem List Patient Active Problem List   Diagnosis Date Noted  . Orthostatic hypotension 10/15/2020  . Presbycusis of both ears 03/08/2020  . Mixed stress and urge urinary incontinence 12/02/2019  . Macrocytosis 12/01/2019  . Nutritional anemia 12/01/2019  . S/P total knee replacement 01/27/2019  . Recurrent left knee instability 07/05/2018  . Respiratory failure with hypoxia ([place]) 08/30/2017  . Hypoxemia   . Heart failure with preserved ejection fraction ([place]), Grade 3 diastolic dysfunction 09/60/4540  . PAF (paroxysmal atrial fibrillation) ([place])   . Dyspnea 03/19/2017  . Encounter for preventive health examination 02/17/2016  . Sensorineural hearing loss (SNHL), bilateral 01/26/2016  . Morbid obesity ([place]) 06/17/2015  . Hypomagnesemia 04/24/2014  . Hemiparesis affecting left side as late effect of cerebrovascular accident ([place]) 04/24/2014  . Nontraumatic cerebral hemorrhage ([place]) 04/30/2012  . DM (diabetes mellitus) with complications ([place]) 98/06/9146  . OSTEOPENIA 01/21/2009  . UNSPECIFIED VITAMIN D DEFICIENCY 11/19/2007  .  HYPERCHOLESTEROLEMIA 10/25/2006  . GASTROESOPHAGEAL REFLUX, NO ESOPHAGITIS 10/25/2006  . DIVERTICULOSIS OF COLON 10/25/2006  . Osteoarthritis 10/25/2006  . CERVICAL SPINE DISORDER, NOS 10/25/2006    Sumner Boast., PT 12/17/2020, 10:55 AM  [facility]. [place], [place], [address] Phone: [phone]   Fax:  [phone]  Name: Khaliyah Northrop  [place] MRN: [id number] Date of Birth: July 01, 1941

## 2020-12-20 ENCOUNTER — Other Ambulatory Visit: Payer: Self-pay

## 2020-12-20 ENCOUNTER — Ambulatory Visit: Payer: Medicare Other | Admitting: Occupational Therapy

## 2020-12-20 ENCOUNTER — Encounter: Payer: Self-pay | Admitting: Occupational Therapy

## 2020-12-20 DIAGNOSIS — R278 Other lack of coordination: Secondary | ICD-10-CM

## 2020-12-20 DIAGNOSIS — R262 Difficulty in walking, not elsewhere classified: Secondary | ICD-10-CM | POA: Diagnosis not present

## 2020-12-20 DIAGNOSIS — R270 Ataxia, unspecified: Secondary | ICD-10-CM | POA: Diagnosis not present

## 2020-12-20 DIAGNOSIS — I69354 Hemiplegia and hemiparesis following cerebral infarction affecting left non-dominant side: Secondary | ICD-10-CM

## 2020-12-20 DIAGNOSIS — M6281 Muscle weakness (generalized): Secondary | ICD-10-CM

## 2020-12-20 DIAGNOSIS — I6990 Unspecified sequelae of unspecified cerebrovascular disease: Secondary | ICD-10-CM | POA: Diagnosis not present

## 2020-12-21 ENCOUNTER — Ambulatory Visit: Payer: Medicare Other | Admitting: Podiatry

## 2020-12-21 NOTE — Therapy (Signed)
[place]. [place], [place], [address] Phone: [phone]   Fax:  [phone]  Occupational Therapy Treatment  Patient Details  Name: Karen Jackson MRN: [id number] Date of Birth: 1941/08/16 Referring Provider (OT): Madison Hickman   Encounter Date: 12/20/2020   OT End of Session - 12/20/20 1108    Visit Number 25    Number of Visits 28    Date for OT Re-Evaluation 01/14/21    Authorization Type Medicare    Progress Note Due on Visit 13    OT Start Time 1103    OT Stop Time 1145    OT Time Calculation (min) 42 min    Activity Tolerance Patient tolerated treatment well    Behavior During Therapy [facility] for tasks assessed/performed           Past Medical History:  Diagnosis Date  . Acute cystitis without hematuria   . Acute diastolic CHF (congestive heart failure) ([place])   . Arthritis   . Dyspnea   . Dysrhythmia   . Fever of unknown origin 03/19/2017  . Hyperlipidemia   . Hypertension    denies at preop  . Multifocal pneumonia   . Neuromuscular disorder ([place])    neuropathy left arm and foot  . Osteopenia   . Paralysis ([place])    partial left side from CVA   . Persistent atrial fibrillation ([place])   . PONV (postoperative nausea and vomiting)   . Pre-diabetes   . Stroke [facility]) 2013   hemmorahgic    Past Surgical History:  Procedure Laterality Date  . ANKLE SURGERY    . APPENDECTOMY    . CHOLECYSTECTOMY    . HERNIA REPAIR     Esophagus  . JOINT REPLACEMENT     total- right partial- left  . MASTECTOMY PARTIAL / LUMPECTOMY  2012   left  . ORIF ANKLE FRACTURE Left 07/20/2018   Procedure: OPEN REDUCTION INTERNAL FIXATION (ORIF) ANKLE FRACTURE;  Surgeon: Wylene Simmer, MD;  Location: [place];  Service: Orthopedics;  Laterality: Left;  . TOTAL KNEE ARTHROPLASTY Left 01/27/2019   Procedure: TOTAL KNEE ARTHROPLASTY;  Surgeon: Vickey Huger, MD;  [facility];  Service: Orthopedics;  Laterality: Left;     There were no vitals filed for this visit.   Subjective Assessment - 12/20/20 1107    Subjective  "I had a great weekend; my son was in town"    Pertinent History CVA with L hemiparesis (2013), OA, HTN, CHF, pre-diabetes    Limitations Pain in LUE, impaired sensation, decreased strength and coordination, decreased PROM/AROM    Patient Stated Goals Improve functional use of LUE, decrease pain    Currently in Pain? Yes    Pain Score 3     Pain Location Shoulder    Pain Orientation Left    Pain Descriptors / Indicators Aching;Pins and needles    Pain Type Chronic pain              OT Treatments/Exercises (OP) - 12/20/20 1115      Modalities   Modalities Moist Heat   Peg activity completed during moist heat therapy     Moist Heat Therapy   Number Minutes Moist Heat 15 Minutes    Moist Heat Location Shoulder   Left     Fine Motor Coordination (Hand/Wrist)   Large Pegboard Placing pegs into stack on large pegboard; able to stack 25 pegs w/out assist, self-supporting under L wrist w/ ~4 drops; pt required  rest breaks in between sets of 5 as stacks progressed in height           Therapeutic Activity: Pt completed word search involving pt striking through words w/ L hand. OT positioned built-up handle on marker and provided re-positioning of paper to improve accuracy. Pt completed 2 attempts w/ practice of simple pre-writing strokes in between trials and success w/ targeting improved significantly during 2nd trial     OT Short Term Goals - 12/15/20 1100      OT SHORT TERM GOAL #1   Title Pt will doff open-front jacket with Min A at least 75% of the time    Baseline Mod A to doff zip-up jacket    Time 3    Period Weeks    Status On-going    Target Date 01/05/21      OT SHORT TERM GOAL #2   Title Pt will thread LUE into open-front jacket w/ Mod I in at least 1/2 trials    Baseline Max A to don zip-up jacket    Time 3    Period Weeks    Status On-going    Target Date  01/05/21      OT SHORT TERM GOAL #3   Title Per self-report, pt will report pain less than 3/10 in LUE when ambulating with RW at home at least 75% of the time.    Baseline Pt reports pain in LUE when ambulating with RW in the home.    Time 3    Period Weeks    Status Achieved   11/01/20 - pt reports no pain ambulating w/ RW at home when she remembers to relax her grip on the walker handle            OT Long Term Goals - 12/06/20 1305      OT LONG TERM GOAL #1   Title Pt will demonstrate/verbalize understanding of compensation and joint protection strategies to decrease pain and increase independence during ADLs.    Baseline Currently Mod A with dressing; participation in ADLs limited by pain    Time 6    Period Weeks    Status Achieved   10/25/20 - verbalized understanding of joint protection strategies and states comfort w/ instructing caregivers during ADLs     OT LONG TERM GOAL #2   Title Pt will improve functional FM coordination of as evidenced by ability to place >50% of pegs into pegboard with LUE.    Baseline Unable to place small pegs with LUE during eval.    Time 6    Period Weeks    Status Achieved   10/05/20 - placed ~75% of pegs into pegboard without dropping     OT LONG TERM GOAL #3   Title Pt will be independent with HEP for BUE strengthening and [place] to improve independence with functional tasks.    Baseline Pt reports no HEP for UE strength/coordination.    Time 6    Period Weeks    Status On-going      OT LONG TERM GOAL #4   Title Pt will improve functional grasp of L hand as evidenced by increasing grip strength by >9 lbs.    Baseline LUE = 11 lbs; RUE = 33 lbs    Time 6    Period Weeks    Status Achieved   10/18/20 - LUE = 21 lbs; RUE = 36 lbs     OT LONG TERM GOAL #5   Title Pt will be  able to deal standard-sized playing cards one-by-one using bilateral thumbs independently at least 80% of the time    Baseline Unable to deal cards using isolated thumb     Time 6    Period Weeks    Status Achieved   12/06/20 - able to deal cards with thumbs 100% of the time w/out pain             Plan - 12/20/20 1115    Clinical Impression Statement Pt completed peg stacking activity w/ self-support provided proximally under L wrist to improve FM control with very positive results; pt was able to stack easy-grip pegs w/ minimal drops and exhibited increased smoothness of hand/finger coordination. Due to shoulder mobility this session, OT attempted to facilitate towel slides, but pt was unable to complete exercise due to pain. Pt then completed word search, focusing on accuracy w/ L hand, for improved control/coordination. Pt initially experienced difficulty, but success improved when cued to use LUE for increased targeting instead of wrist/fingers.    OT Occupational Profile and History Problem Focused Assessment - Including review of records relating to presenting problem    Occupational performance deficits (Please refer to evaluation for details): ADL's;IADL's    Body Structure / Function / Physical Skills ADL;UE functional use;Balance;Body mechanics;Pain;FMC;Proprioception;ROM;Coordination;GMC;Sensation;Strength;Dexterity    Rehab Potential Fair    Clinical Decision Making Several treatment options, min-mod task modification necessary    Comorbidities Affecting Occupational Performance: May have comorbidities impacting occupational performance    Modification or Assistance to Complete Evaluation  Min-Moderate modification of tasks or assist with assess necessary to complete eval    OT Frequency 1x / week    OT Duration 6 weeks    OT Treatment/Interventions Self-care/ADL training;Ultrasound;Compression bandaging;DME and/or AE instruction;Patient/family education;Passive range of motion;Electrical Stimulation;Splinting;Moist Heat;Therapeutic exercise;Manual Therapy;Therapeutic activities;Neuromuscular education;Energy  conservation;Iontophoresis;Cryotherapy;Functional Mobility Training;Aquatic Therapy    Plan Core strengthening and weight-shifting; UB dressing    Consulted and Agree with Plan of Care Patient           Patient will benefit from skilled therapeutic intervention in order to improve the following deficits and impairments:   Body Structure / Function / Physical Skills: ADL,UE functional use,Balance,Body mechanics,Pain,FMC,Proprioception,ROM,Coordination,GMC,Sensation,Strength,Dexterity       Visit Diagnosis: Ataxia  Hemiplegia and hemiparesis following cerebral infarction affecting left non-dominant side (HCC)  Muscle weakness (generalized)  Other lack of coordination    Problem List Patient Active Problem List   Diagnosis Date Noted  . Orthostatic hypotension 10/15/2020  . Presbycusis of both ears 03/08/2020  . Mixed stress and urge urinary incontinence 12/02/2019  . Macrocytosis 12/01/2019  . Nutritional anemia 12/01/2019  . S/P total knee replacement 01/27/2019  . Recurrent left knee instability 07/05/2018  . Respiratory failure with hypoxia ([place]) 08/30/2017  . Hypoxemia   . Heart failure with preserved ejection fraction ([place]), Grade 3 diastolic dysfunction 52/77/8242  . PAF (paroxysmal atrial fibrillation) ([place])   . Dyspnea 03/19/2017  . Encounter for preventive health examination 02/17/2016  . Sensorineural hearing loss (SNHL), bilateral 01/26/2016  . Morbid obesity ([place]) 06/17/2015  . Hypomagnesemia 04/24/2014  . Hemiparesis affecting left side as late effect of cerebrovascular accident ([place]) 04/24/2014  . Nontraumatic cerebral hemorrhage ([place]) 04/30/2012  . DM (diabetes mellitus) with complications ([place]) 35/36/1443  . OSTEOPENIA 01/21/2009  . UNSPECIFIED VITAMIN D DEFICIENCY 11/19/2007  . HYPERCHOLESTEROLEMIA 10/25/2006  . GASTROESOPHAGEAL REFLUX, NO ESOPHAGITIS 10/25/2006  . DIVERTICULOSIS OF COLON 10/25/2006  . Osteoarthritis 10/25/2006  . CERVICAL SPINE  DISORDER, NOS 10/25/2006  Kathrine Cords, OTR/L, MSOT 12/20/2020, 11:45 AM  [place]. [place], [place], [address] Phone: [phone]   Fax:  [phone]  Name: Karen Jackson MRN: [id number] Date of Birth: 22-Nov-1940

## 2020-12-23 DIAGNOSIS — Z23 Encounter for immunization: Secondary | ICD-10-CM | POA: Diagnosis not present

## 2020-12-31 ENCOUNTER — Ambulatory Visit: Payer: Medicare Other | Attending: Family Medicine | Admitting: Occupational Therapy

## 2020-12-31 ENCOUNTER — Other Ambulatory Visit: Payer: Self-pay

## 2020-12-31 ENCOUNTER — Encounter: Payer: Self-pay | Admitting: Occupational Therapy

## 2020-12-31 DIAGNOSIS — M6281 Muscle weakness (generalized): Secondary | ICD-10-CM | POA: Insufficient documentation

## 2020-12-31 DIAGNOSIS — I69354 Hemiplegia and hemiparesis following cerebral infarction affecting left non-dominant side: Secondary | ICD-10-CM | POA: Insufficient documentation

## 2020-12-31 DIAGNOSIS — R278 Other lack of coordination: Secondary | ICD-10-CM | POA: Diagnosis not present

## 2020-12-31 DIAGNOSIS — R270 Ataxia, unspecified: Secondary | ICD-10-CM | POA: Insufficient documentation

## 2020-12-31 NOTE — Therapy (Signed)
[place]. [place], [place], [address] Phone: [phone]   Fax:  [phone]  Occupational Therapy Treatment  Patient Details  Name: Karen Jackson MRN: [id number] Date of Birth: 16-Nov-1940 Referring Provider (OT): Madison Hickman   Encounter Date: 12/31/2020   OT End of Session - 12/31/20 1109    Visit Number 26    Number of Visits 28    Date for OT Re-Evaluation 01/14/21    Authorization Type Medicare    Progress Note Due on Visit 30    OT Start Time 1100    OT Stop Time 1142    OT Time Calculation (min) 42 min    Activity Tolerance Patient tolerated treatment well    Behavior During Therapy [facility] for tasks assessed/performed           Past Medical History:  Diagnosis Date  . Acute cystitis without hematuria   . Acute diastolic CHF (congestive heart failure) ([place])   . Arthritis   . Dyspnea   . Dysrhythmia   . Fever of unknown origin 03/19/2017  . Hyperlipidemia   . Hypertension    denies at preop  . Multifocal pneumonia   . Neuromuscular disorder ([place])    neuropathy left arm and foot  . Osteopenia   . Paralysis ([place])    partial left side from CVA   . Persistent atrial fibrillation ([place])   . PONV (postoperative nausea and vomiting)   . Pre-diabetes   . Stroke [facility]) 2013   hemmorahgic    Past Surgical History:  Procedure Laterality Date  . ANKLE SURGERY    . APPENDECTOMY    . CHOLECYSTECTOMY    . HERNIA REPAIR     Esophagus  . JOINT REPLACEMENT     total- right partial- left  . MASTECTOMY PARTIAL / LUMPECTOMY  2012   left  . ORIF ANKLE FRACTURE Left 07/20/2018   Procedure: OPEN REDUCTION INTERNAL FIXATION (ORIF) ANKLE FRACTURE;  Surgeon: Wylene Simmer, MD;  Location: [place];  Service: Orthopedics;  Laterality: Left;  . TOTAL KNEE ARTHROPLASTY Left 01/27/2019   Procedure: TOTAL KNEE ARTHROPLASTY;  Surgeon: Vickey Huger, MD;  [facility];  Service: Orthopedics;  Laterality: Left;     There were no vitals filed for this visit.   Subjective Assessment - 12/31/20 1107    Subjective  Pt states she is done w/ PT, but will continue using the gym equipment on Mondays and Fridays    Pertinent History CVA with L hemiparesis (2013), OA, HTN, CHF, pre-diabetes    Limitations Pain in LUE, impaired sensation, decreased strength and coordination, decreased PROM/AROM    Patient Stated Goals Improve functional use of LUE, decrease pain    Currently in Pain? Yes    Pain Score 4     Pain Location Shoulder    Pain Orientation Left    Pain Descriptors / Indicators Aching;Pins and needles    Pain Type Chronic pain    Pain Radiating Towards Shoulder to forearm    Pain Onset More than a month ago    Pain Frequency Constant             OT Treatments/Exercises (OP) - 12/31/20 1145      Modalities   Modalities Moist Heat   Initial marble activit completed during moist heat therapy     Moist Heat Therapy   Number Minutes Moist Heat 15 Minutes    Moist Heat Location Shoulder   Left  Therapeutic Activities Pt attempted to complete pinching activity involving picking up marbles w/ light-graded resistance clothespin and then placing marble on easy-grip pegs; pt demo'd carryover of learned coordination compensatory strategies without success. Activity was graded down w/ various methods with continued lack of success and activity was d/c  Pt retrieved positioned pegs out of small pegboard to facilitate [facility] and functional pinch; pt did well w/ activity and was able to complete w/ 90% accuracy (2/18 drops)  Gentle elbow flexion and extension AROM completed w/ LUE to improve ROM w/ OT providing education on importance of isolating joint AROM occasionally and independently at home to decrease stiffness and ideally decrease pain  Pt completed ~10 minutes of FM coordination activities including rotating a ball in-hand, tossing a ball between her hands, and attempting to shuffle  playing cards; handout provided w/ demo'd exercises and additional options for pt to complete activities at home as able     OT Education - 12/31/20 1115    Education Details Education provided on simple coordination exercises pt is able to complete at home    Person(s) Educated Patient    Methods Explanation;Demonstration;Handout    Comprehension Verbalized understanding;Returned demonstration            OT Short Term Goals - 12/15/20 1100      OT SHORT TERM GOAL #1   Title Pt will doff open-front jacket with Min A at least 75% of the time    Baseline Mod A to doff zip-up jacket    Time 3    Period Weeks    Status On-going    Target Date 01/05/21      OT SHORT TERM GOAL #2   Title Pt will thread LUE into open-front jacket w/ Mod I in at least 1/2 trials    Baseline Max A to don zip-up jacket    Time 3    Period Weeks    Status On-going    Target Date 01/05/21      OT SHORT TERM GOAL #3   Title Per self-report, pt will report pain less than 3/10 in LUE when ambulating with RW at home at least 75% of the time.    Baseline Pt reports pain in LUE when ambulating with RW in the home.    Time 3    Period Weeks    Status Achieved   11/01/20 - pt reports no pain ambulating w/ RW at home when she remembers to relax her grip on the walker handle            OT Long Term Goals - 12/06/20 1305      OT LONG TERM GOAL #1   Title Pt will demonstrate/verbalize understanding of compensation and joint protection strategies to decrease pain and increase independence during ADLs.    Baseline Currently Mod A with dressing; participation in ADLs limited by pain    Time 6    Period Weeks    Status Achieved   10/25/20 - verbalized understanding of joint protection strategies and states comfort w/ instructing caregivers during ADLs     OT LONG TERM GOAL #2   Title Pt will improve functional FM coordination of as evidenced by ability to place >50% of pegs into pegboard with LUE.    Baseline  Unable to place small pegs with LUE during eval.    Time 6    Period Weeks    Status Achieved   10/05/20 - placed ~75% of pegs into pegboard without dropping  OT LONG TERM GOAL #3   Title Pt will be independent with HEP for BUE strengthening and [place] to improve independence with functional tasks.    Baseline Pt reports no HEP for UE strength/coordination.    Time 6    Period Weeks    Status On-going      OT LONG TERM GOAL #4   Title Pt will improve functional grasp of L hand as evidenced by increasing grip strength by >9 lbs.    Baseline LUE = 11 lbs; RUE = 33 lbs    Time 6    Period Weeks    Status Achieved   10/18/20 - LUE = 21 lbs; RUE = 36 lbs     OT LONG TERM GOAL #5   Title Pt will be able to deal standard-sized playing cards one-by-one using bilateral thumbs independently at least 80% of the time    Baseline Unable to deal cards using isolated thumb    Time 6    Period Weeks    Status Achieved   12/06/20 - able to deal cards with thumbs 100% of the time w/out pain           Plan - 12/31/20 1145    Clinical Impression Statement Due to pt's upcoming recertification, OT reviewed goals and progress w/ pt and discussed potential for d/c within the next few weeks; pt was receptive. Pt's shoulder was slightly more painful today, pt reports rainy weather affects this, so OT addressed FM control and coordination w/ pt showing excellent carryover of learned strategies. Pt attempted pinch prehension activity and was unsuccessful after multiple attempts, so activity was graded down, which greatly improved success. Pt was also provided w/ handout of additional activities pt is able to do at home to continue to address coordination.    OT Occupational Profile and History Problem Focused Assessment - Including review of records relating to presenting problem    Occupational performance deficits (Please refer to evaluation for details): ADL's;IADL's    Body Structure / Function / Physical  Skills ADL;UE functional use;Balance;Body mechanics;Pain;FMC;Proprioception;ROM;Coordination;GMC;Sensation;Strength;Dexterity    Rehab Potential Fair    Clinical Decision Making Several treatment options, min-mod task modification necessary    Comorbidities Affecting Occupational Performance: May have comorbidities impacting occupational performance    Modification or Assistance to Complete Evaluation  Min-Moderate modification of tasks or assist with assess necessary to complete eval    OT Frequency 1x / week    OT Duration 6 weeks    OT Treatment/Interventions Self-care/ADL training;Ultrasound;Compression bandaging;DME and/or AE instruction;Patient/family education;Passive range of motion;Electrical Stimulation;Splinting;Moist Heat;Therapeutic exercise;Manual Therapy;Therapeutic activities;Neuromuscular education;Energy conservation;Iontophoresis;Cryotherapy;Functional Mobility Training;Aquatic Therapy    Plan Core strengthening and weight-shifting; UB dressing    Consulted and Agree with Plan of Care Patient           Patient will benefit from skilled therapeutic intervention in order to improve the following deficits and impairments:   Body Structure / Function / Physical Skills: ADL,UE functional use,Balance,Body mechanics,Pain,FMC,Proprioception,ROM,Coordination,GMC,Sensation,Strength,Dexterity       Visit Diagnosis: Ataxia  Hemiplegia and hemiparesis following cerebral infarction affecting left non-dominant side (HCC)  Muscle weakness (generalized)  Other lack of coordination    Problem List Patient Active Problem List   Diagnosis Date Noted  . Orthostatic hypotension 10/15/2020  . Presbycusis of both ears 03/08/2020  . Mixed stress and urge urinary incontinence 12/02/2019  . Macrocytosis 12/01/2019  . Nutritional anemia 12/01/2019  . S/P total knee replacement 01/27/2019  . Recurrent left knee instability 07/05/2018  .  Respiratory failure with hypoxia ([place])  08/30/2017  . Hypoxemia   . Heart failure with preserved ejection fraction ([place]), Grade 3 diastolic dysfunction 85/27/7824  . PAF (paroxysmal atrial fibrillation) ([place])   . Dyspnea 03/19/2017  . Encounter for preventive health examination 02/17/2016  . Sensorineural hearing loss (SNHL), bilateral 01/26/2016  . Morbid obesity ([place]) 06/17/2015  . Hypomagnesemia 04/24/2014  . Hemiparesis affecting left side as late effect of cerebrovascular accident ([place]) 04/24/2014  . Nontraumatic cerebral hemorrhage ([place]) 04/30/2012  . DM (diabetes mellitus) with complications ([place]) 23/53/6144  . OSTEOPENIA 01/21/2009  . UNSPECIFIED VITAMIN D DEFICIENCY 11/19/2007  . HYPERCHOLESTEROLEMIA 10/25/2006  . GASTROESOPHAGEAL REFLUX, NO ESOPHAGITIS 10/25/2006  . DIVERTICULOSIS OF COLON 10/25/2006  . Osteoarthritis 10/25/2006  . CERVICAL SPINE DISORDER, NOS 10/25/2006     Kathrine Cords, OTR/L, MSOT 12/30/2020, 11:45 AM  [place]. [place], [place], [address] Phone: [phone]   Fax:  [phone]  Name: Karen Jackson MRN: [id number] Date of Birth: 02-14-1941

## 2021-01-03 ENCOUNTER — Encounter: Payer: Self-pay | Admitting: Occupational Therapy

## 2021-01-03 ENCOUNTER — Other Ambulatory Visit: Payer: Self-pay

## 2021-01-03 ENCOUNTER — Ambulatory Visit: Payer: Medicare Other | Admitting: Occupational Therapy

## 2021-01-03 DIAGNOSIS — I69354 Hemiplegia and hemiparesis following cerebral infarction affecting left non-dominant side: Secondary | ICD-10-CM | POA: Diagnosis not present

## 2021-01-03 DIAGNOSIS — R278 Other lack of coordination: Secondary | ICD-10-CM | POA: Diagnosis not present

## 2021-01-03 DIAGNOSIS — M6281 Muscle weakness (generalized): Secondary | ICD-10-CM

## 2021-01-03 DIAGNOSIS — R270 Ataxia, unspecified: Secondary | ICD-10-CM

## 2021-01-03 NOTE — Therapy (Signed)
[place]. [place], [place], [address] Phone: [phone]   Fax:  [phone]  Occupational Therapy Treatment  Patient Details  Name: Karen Jackson MRN: [id number] Date of Birth: 1940-12-29 Referring Provider (OT): Madison Hickman   Encounter Date: 01/03/2021   OT End of Session - 01/03/21 1103    Visit Number 27    Number of Visits 28    Date for OT Re-Evaluation 01/14/21    Authorization Type Medicare    Progress Note Due on Visit 30    OT Start Time 1100    OT Stop Time 1143    OT Time Calculation (min) 43 min    Activity Tolerance Patient tolerated treatment well    Behavior During Therapy [facility] for tasks assessed/performed           Past Medical History:  Diagnosis Date  . Acute cystitis without hematuria   . Acute diastolic CHF (congestive heart failure) ([place])   . Arthritis   . Dyspnea   . Dysrhythmia   . Fever of unknown origin 03/19/2017  . Hyperlipidemia   . Hypertension    denies at preop  . Multifocal pneumonia   . Neuromuscular disorder ([place])    neuropathy left arm and foot  . Osteopenia   . Paralysis ([place])    partial left side from CVA   . Persistent atrial fibrillation ([place])   . PONV (postoperative nausea and vomiting)   . Pre-diabetes   . Stroke [facility]) 2013   hemmorahgic    Past Surgical History:  Procedure Laterality Date  . ANKLE SURGERY    . APPENDECTOMY    . CHOLECYSTECTOMY    . HERNIA REPAIR     Esophagus  . JOINT REPLACEMENT     total- right partial- left  . MASTECTOMY PARTIAL / LUMPECTOMY  2012   left  . ORIF ANKLE FRACTURE Left 07/20/2018   Procedure: OPEN REDUCTION INTERNAL FIXATION (ORIF) ANKLE FRACTURE;  Surgeon: Wylene Simmer, MD;  Location: [place];  Service: Orthopedics;  Laterality: Left;  . TOTAL KNEE ARTHROPLASTY Left 01/27/2019   Procedure: TOTAL KNEE ARTHROPLASTY;  Surgeon: Vickey Huger, MD;  [facility];  Service: Orthopedics;  Laterality: Left;     There were no vitals filed for this visit.   Subjective Assessment - 01/03/21 1105    Subjective  "I didn't realize I had been coming since January"    Pertinent History CVA with L hemiparesis (2013), OA, HTN, CHF, pre-diabetes    Limitations Pain in LUE, impaired sensation, decreased strength and coordination, decreased PROM/AROM    Patient Stated Goals Improve functional use of LUE, decrease pain    Currently in Pain? Yes    Pain Score 4     Pain Location Shoulder    Pain Orientation Left    Pain Descriptors / Indicators Aching;Pins and needles    Pain Type Chronic pain    Pain Radiating Towards shoulder to forearm    Pain Onset More than a month ago    Pain Frequency Constant             OT Treatments/Exercises (OP) - 01/03/21 1111      ADLs   UB Dressing Practiced donning and doffing open-front shirt; pt able to complete both trials w/ Min A, requiring assist only to pull shirt around her back during donning and doffing. OT provided demo w/ pt verbalizing understanding      Shoulder Exercises: ROM/Strengthening   Other ROM/Strengthening  Exercises Towel slides at tabletop height; 2 sets of 5 completed in forward flexion w/ LUE; 1 set of 10 on RUE. OT provided cueing to activate core/weight shifting during exercise      Visual/Perceptual Exercises   Other Exercises Played game of Connect 4 to encourage visual perception, R shoulder functional overhead reach, and core strength      Modalities   Modalities Moist Heat   Pegboard activity completed during moist heat therapy     Moist Heat Therapy   Number Minutes Moist Heat 15 Minutes    Moist Heat Location Shoulder   Left     Manual Therapy   Manual Therapy Passive ROM    Passive ROM Gentle therapist-facilitated PROM of L and R shoulder flexion and abduction; able to achieve increased range without L shoulder pain compared w/ AROM   R shoulder flexion ~75*, L shoulder ~60*; R shoulder abd ~95*, L shoulder ~30*      Fine Motor Coordination (Hand/Wrist)   Large Pegboard Placing 10 pegs into large pegboard; pt able to complete 80% w/out dropping using RUE to support under L wrist and self-monitoring for pacing             OT Short Term Goals - 12/15/20 1100      OT SHORT TERM GOAL #1   Title Pt will doff open-front jacket with Min A at least 75% of the time    Baseline Mod A to doff zip-up jacket    Time 3    Period Weeks    Status On-going    Target Date 01/05/21      OT SHORT TERM GOAL #2   Title Pt will thread LUE into open-front jacket w/ Mod I in at least 1/2 trials    Baseline Max A to don zip-up jacket    Time 3    Period Weeks    Status On-going    Target Date 01/05/21      OT SHORT TERM GOAL #3   Title Per self-report, pt will report pain less than 3/10 in LUE when ambulating with RW at home at least 75% of the time.    Baseline Pt reports pain in LUE when ambulating with RW in the home.    Time 3    Period Weeks    Status Achieved   11/01/20 - pt reports no pain ambulating w/ RW at home when she remembers to relax her grip on the walker handle            OT Long Term Goals - 12/06/20 1305      OT LONG TERM GOAL #1   Title Pt will demonstrate/verbalize understanding of compensation and joint protection strategies to decrease pain and increase independence during ADLs.    Baseline Currently Mod A with dressing; participation in ADLs limited by pain    Time 6    Period Weeks    Status Achieved   10/25/20 - verbalized understanding of joint protection strategies and states comfort w/ instructing caregivers during ADLs     OT LONG TERM GOAL #2   Title Pt will improve functional FM coordination of as evidenced by ability to place >50% of pegs into pegboard with LUE.    Baseline Unable to place small pegs with LUE during eval.    Time 6    Period Weeks    Status Achieved   10/05/20 - placed ~75% of pegs into pegboard without dropping     OT LONG TERM  GOAL #3   Title Pt will be  independent with HEP for BUE strengthening and [place] to improve independence with functional tasks.    Baseline Pt reports no HEP for UE strength/coordination.    Time 6    Period Weeks    Status On-going      OT LONG TERM GOAL #4   Title Pt will improve functional grasp of L hand as evidenced by increasing grip strength by >9 lbs.    Baseline LUE = 11 lbs; RUE = 33 lbs    Time 6    Period Weeks    Status Achieved   10/18/20 - LUE = 21 lbs; RUE = 36 lbs     OT LONG TERM GOAL #5   Title Pt will be able to deal standard-sized playing cards one-by-one using bilateral thumbs independently at least 80% of the time    Baseline Unable to deal cards using isolated thumb    Time 6    Period Weeks    Status Achieved   12/06/20 - able to deal cards with thumbs 100% of the time w/out pain            Plan - 01/03/21 1158    Clinical Impression Statement In preparation for d/c next week, OT reviewed pt's HEP and discussed benefit of completing exercises w/ both LUE and RUE for maintenance of range of motion, coordination, and strength. OT also completed gentle PROM and assessed range in preparation for practicing UB dressing and to facilitate functional reach. Pt demo'd improved range when cued to release tension in neck/shoulders and incorporate core activation during exercises. Pt also exhibited continued improvement w/ LUE/hand coordination, using learned strategies for success, as evidenced by being able to place 80% of easy-grip pegs into pegboard w/out drops. Rest breaks and verbal cues for pacing continue to be beneficial.    OT Occupational Profile and History Problem Focused Assessment - Including review of records relating to presenting problem    Occupational performance deficits (Please refer to evaluation for details): ADL's;IADL's    Body Structure / Function / Physical Skills ADL;UE functional use;Balance;Body  mechanics;Pain;FMC;Proprioception;ROM;Coordination;GMC;Sensation;Strength;Dexterity    Rehab Potential Fair    Clinical Decision Making Several treatment options, min-mod task modification necessary    Comorbidities Affecting Occupational Performance: May have comorbidities impacting occupational performance    Modification or Assistance to Complete Evaluation  Min-Moderate modification of tasks or assist with assess necessary to complete eval    OT Frequency 1x / week    OT Duration 6 weeks    OT Treatment/Interventions Self-care/ADL training;Ultrasound;Compression bandaging;DME and/or AE instruction;Patient/family education;Passive range of motion;Electrical Stimulation;Splinting;Moist Heat;Therapeutic exercise;Manual Therapy;Therapeutic activities;Neuromuscular education;Energy conservation;Iontophoresis;Cryotherapy;Functional Mobility Training;Aquatic Therapy    Plan D/C    Consulted and Agree with Plan of Care Patient           Patient will benefit from skilled therapeutic intervention in order to improve the following deficits and impairments:   Body Structure / Function / Physical Skills: ADL,UE functional use,Balance,Body mechanics,Pain,FMC,Proprioception,ROM,Coordination,GMC,Sensation,Strength,Dexterity       Visit Diagnosis: Ataxia  Hemiplegia and hemiparesis following cerebral infarction affecting left non-dominant side (HCC)  Muscle weakness (generalized)  Other lack of coordination    Problem List Patient Active Problem List   Diagnosis Date Noted  . Orthostatic hypotension 10/15/2020  . Presbycusis of both ears 03/08/2020  . Mixed stress and urge urinary incontinence 12/02/2019  . Macrocytosis 12/01/2019  . Nutritional anemia 12/01/2019  . S/P total knee replacement 01/27/2019  . Recurrent left  knee instability 07/05/2018  . Respiratory failure with hypoxia ([place]) 08/30/2017  . Hypoxemia   . Heart failure with preserved ejection fraction ([place]), Grade 3  diastolic dysfunction 99/37/1696  . PAF (paroxysmal atrial fibrillation) ([place])   . Dyspnea 03/19/2017  . Encounter for preventive health examination 02/17/2016  . Sensorineural hearing loss (SNHL), bilateral 01/26/2016  . Morbid obesity ([place]) 06/17/2015  . Hypomagnesemia 04/24/2014  . Hemiparesis affecting left side as late effect of cerebrovascular accident ([place]) 04/24/2014  . Nontraumatic cerebral hemorrhage ([place]) 04/30/2012  . DM (diabetes mellitus) with complications ([place]) 78/93/8101  . OSTEOPENIA 01/21/2009  . UNSPECIFIED VITAMIN D DEFICIENCY 11/19/2007  . HYPERCHOLESTEROLEMIA 10/25/2006  . GASTROESOPHAGEAL REFLUX, NO ESOPHAGITIS 10/25/2006  . DIVERTICULOSIS OF COLON 10/25/2006  . Osteoarthritis 10/25/2006  . CERVICAL SPINE DISORDER, NOS 10/25/2006     Kathrine Cords, OTR/L, MSOT 01/03/2021, 12:11 PM  [place]. [place], [place], [address] Phone: [phone]   Fax:  [phone]  Name: Karen Jackson MRN: [id number] Date of Birth: 1940/11/09

## 2021-01-04 ENCOUNTER — Ambulatory Visit: Payer: Medicare Other | Admitting: Podiatry

## 2021-01-10 ENCOUNTER — Other Ambulatory Visit: Payer: Self-pay

## 2021-01-10 ENCOUNTER — Ambulatory Visit: Payer: Medicare Other | Admitting: Occupational Therapy

## 2021-01-10 DIAGNOSIS — R051 Acute cough: Secondary | ICD-10-CM | POA: Diagnosis not present

## 2021-01-10 DIAGNOSIS — E118 Type 2 diabetes mellitus with unspecified complications: Secondary | ICD-10-CM

## 2021-01-10 DIAGNOSIS — R062 Wheezing: Secondary | ICD-10-CM | POA: Diagnosis not present

## 2021-01-10 DIAGNOSIS — Z20822 Contact with and (suspected) exposure to covid-19: Secondary | ICD-10-CM | POA: Diagnosis not present

## 2021-01-10 NOTE — Telephone Encounter (Signed)
Patient calls nurse line stating she needs a refill on Ozempic. Patient reports she would like to go up on her dosage. Patient reports her caregiver mentioned going up to 1mg . Will forward to PCP.

## 2021-01-11 MED ORDER — OZEMPIC (0.25 OR 0.5 MG/DOSE) 2 MG/1.5ML ~~LOC~~ SOPN: 0.5000 mg | PEN_INJECTOR | SUBCUTANEOUS | 3 refills | Status: DC

## 2021-01-11 NOTE — Telephone Encounter (Signed)
Called patient and discussed.  Currently on 0.5 mg weekly.  Last A1C 2/22 was 6.9.  Her reason for wanting to increase is to help with weight loss.  After discussion, she desires to remain at the 0.5 mg dose.

## 2021-01-17 ENCOUNTER — Ambulatory Visit: Payer: Medicare Other | Admitting: Occupational Therapy

## 2021-01-26 ENCOUNTER — Encounter: Payer: Self-pay | Admitting: Occupational Therapy

## 2021-01-26 ENCOUNTER — Other Ambulatory Visit: Payer: Self-pay

## 2021-01-26 ENCOUNTER — Ambulatory Visit: Payer: Medicare Other | Attending: Family Medicine | Admitting: Occupational Therapy

## 2021-01-26 DIAGNOSIS — M6281 Muscle weakness (generalized): Secondary | ICD-10-CM | POA: Insufficient documentation

## 2021-01-26 DIAGNOSIS — R270 Ataxia, unspecified: Secondary | ICD-10-CM

## 2021-01-26 DIAGNOSIS — I69354 Hemiplegia and hemiparesis following cerebral infarction affecting left non-dominant side: Secondary | ICD-10-CM | POA: Diagnosis not present

## 2021-01-26 DIAGNOSIS — R278 Other lack of coordination: Secondary | ICD-10-CM | POA: Diagnosis not present

## 2021-01-27 NOTE — Therapy (Signed)
[place] [address]. [place], [place], [address] Phone: [phone]   Fax:  [phone]  Occupational Therapy Treatment  Patient Details  Name: Karen Jackson MRN: [id number] Date of Birth: 10/01/1940 Referring Provider (OT): Madison Hickman   Encounter Date: 01/26/2021   OT End of Session - 01/26/21 1105    Visit Number 28    Number of Visits 28    Date for OT Re-Evaluation 01/14/21    Authorization Type Medicare    Progress Note Due on Visit 57    OT Start Time 1101    OT Stop Time 1145    OT Time Calculation (min) 44 min    Activity Tolerance Patient tolerated treatment well    Behavior During Therapy [facility] for tasks assessed/performed           Past Medical History:  Diagnosis Date  . Acute cystitis without hematuria   . Acute diastolic CHF (congestive heart failure) ([place])   . Arthritis   . Dyspnea   . Dysrhythmia   . Fever of unknown origin 03/19/2017  . Hyperlipidemia   . Hypertension    denies at preop  . Multifocal pneumonia   . Neuromuscular disorder ([place])    neuropathy left arm and foot  . Osteopenia   . Paralysis ([place])    partial left side from CVA   . Persistent atrial fibrillation ([place])   . PONV (postoperative nausea and vomiting)   . Pre-diabetes   . Stroke [facility]) 2013   hemmorahgic    Past Surgical History:  Procedure Laterality Date  . ANKLE SURGERY    . APPENDECTOMY    . CHOLECYSTECTOMY    . HERNIA REPAIR     Esophagus  . JOINT REPLACEMENT     total- right partial- left  . MASTECTOMY PARTIAL / LUMPECTOMY  2012   left  . ORIF ANKLE FRACTURE Left 07/20/2018   Procedure: OPEN REDUCTION INTERNAL FIXATION (ORIF) ANKLE FRACTURE;  Surgeon: Wylene Simmer, MD;  Location: [place];  Service: Orthopedics;  Laterality: Left;  . TOTAL KNEE ARTHROPLASTY Left 01/27/2019   Procedure: TOTAL KNEE ARTHROPLASTY;  Surgeon: Vickey Huger, MD;  [facility];  Service: Orthopedics;  Laterality: Left;     There were no vitals filed for this visit.   Subjective Assessment - 01/26/21 1105    Subjective  "I had pneumonia and still have a cough, but I am feeling much better."    Pertinent History CVA with L hemiparesis (2013), OA, HTN, CHF, pre-diabetes    Limitations Pain in LUE, impaired sensation, decreased strength and coordination, decreased PROM/AROM    Patient Stated Goals Improve functional use of LUE, decrease pain    Currently in Pain? Yes    Pain Score 2     Pain Location Shoulder    Pain Orientation Left    Pain Descriptors / Indicators Aching;Pins and needles    Pain Type Chronic pain    Pain Radiating Towards shoulder to forearm    Pain Onset More than a month ago    Pain Frequency Constant             OT Treatments/Exercises (OP) - 01/26/21 1552      ADLs   UB Dressing Independently verbalized compensatory strategies for donning/doffing zip-up jacket      Modalities   Modalities Moist Heat   [place] card game played during moist heat therapy     Moist Heat Therapy   Number Minutes Moist Heat  20 Minutes    Moist Heat Location Shoulder   Left     Splinting   Splinting OT donned LUE brace for shoulder support at start of session          Therapeutic Activity Uno card game played w/ pt; pt able to independently implement previously learned strategies to improve success, including stabilizing proximally to increase distal control and pacing/decreasing speed of movement. Activity also facilitated functional forward reach, w/ pt able to retrieve/place cards on tabletop w/out pain using L shoulder and working memory w/ pt actively recalling novel game rules throughout activity      OT Short Term Goals - 01/26/21 1112      OT [place] #1   Title Pt will doff open-front jacket with Min A at least 75% of the time    Baseline Mod A to doff zip-up jacket    Time 3    Period Weeks    Status Unable to assess    Target Date 01/05/21      OT SHORT TERM GOAL #2    Title Pt will thread LUE into open-front jacket w/ Mod I in at least 1/2 trials    Baseline Max A to don zip-up jacket    Time 3    Period Weeks    Status Unable to assess    Target Date 01/05/21      OT SHORT TERM GOAL #3   Title Per self-report, pt will report pain less than 3/10 in LUE when ambulating with RW at home at least 75% of the time.    Baseline Pt reports pain in LUE when ambulating with RW in the home.    Time 3    Period Weeks    Status Achieved   11/01/20 - pt reports no pain ambulating w/ RW at home when she remembers to relax her grip on the walker handle            OT Long Term Goals - 01/26/21 1113      OT LONG TERM GOAL #1   Title Pt will demonstrate/verbalize understanding of compensation and joint protection strategies to decrease pain and increase independence during ADLs.    Baseline Currently Mod A with dressing; participation in ADLs limited by pain    Time 6    Period Weeks    Status Achieved   10/25/20 - verbalized understanding of joint protection strategies and states comfort w/ instructing caregivers during ADLs     OT LONG TERM GOAL #2   Title Pt will improve functional FM coordination of as evidenced by ability to place >50% of pegs into pegboard with LUE.    Baseline Unable to place small pegs with LUE during eval.    Time 6    Period Weeks    Status Achieved   10/05/20 - placed ~75% of pegs into pegboard without dropping     OT LONG TERM GOAL #3   Title Pt will be independent with HEP for BUE strengthening and [place] to improve independence with functional tasks.    Baseline Pt reports no HEP for UE strength/coordination.    Time 6    Period Weeks    Status Achieved   01/26/21 - Ind w/ HEP     OT LONG TERM GOAL #4   Title Pt will improve functional grasp of L hand as evidenced by increasing grip strength by >9 lbs.    Baseline LUE = 11 lbs; RUE = 33 lbs  Time 6    Period Weeks    Status Achieved   10/18/20 - LUE = 21 lbs; RUE = 36 lbs      OT LONG TERM GOAL #5   Title Pt will be able to deal standard-sized playing cards one-by-one using bilateral thumbs independently at least 80% of the time    Baseline Unable to deal cards using isolated thumb    Time 6    Period Weeks    Status Achieved   12/06/20 - able to deal cards with thumbs 100% of the time w/out pain            Plan - 01/26/21 1112    Clinical Impression Statement Pt returns for OT today after being sick the previous 2 weeks, reporting that she is feeling better. OT donned pt's shoulder brace and applied heat to L shoulder at start of session to provide support and decrease pain. Uno card game used to continue to facilitate bilateral integration, FM control/dexterity, cognition, and coordination. Pt did well with activity and was able to independently self-monitor and implement strategies learned in OT to improve success w/ activity. Due to pt's fatigue after being sick as well as time taken completing FM activity, OT unable to address goals for donning/doffing zip-up jacket, but pt was able to verbalize compensatory strategies to practice at home. Pt has met Jackson other STGs and LTGs at this time and is appropriate for d/c from skilled OT to HEP; education provided on continuation of updated HEP. Pt reports she is satisfied with progress in therapy and is agreeable to discharge plan at this time.    OT Occupational Profile and History Problem Focused Assessment - Including review of records relating to presenting problem    Occupational performance deficits (Please refer to evaluation for details): ADL's;IADL's    Body Structure / Function / Physical Skills ADL;UE functional use;Balance;Body mechanics;Pain;FMC;Proprioception;ROM;Coordination;GMC;Sensation;Strength;Dexterity    Rehab Potential Fair    Clinical Decision Making Several treatment options, min-mod task modification necessary    Comorbidities Affecting Occupational Performance: May have comorbidities impacting  occupational performance    Modification or Assistance to Complete Evaluation  Min-Moderate modification of tasks or assist with assess necessary to complete eval    OT Frequency 1x / week    OT Duration 6 weeks    OT Treatment/Interventions Self-care/ADL training;Ultrasound;Compression bandaging;DME and/or AE instruction;Patient/family education;Passive range of motion;Electrical Stimulation;Splinting;Moist Heat;Therapeutic exercise;Manual Therapy;Therapeutic activities;Neuromuscular education;Energy conservation;Iontophoresis;Cryotherapy;Functional Mobility Training;Aquatic Therapy    Plan --    Consulted and Agree with Plan of Care Patient           OCCUPATIONAL THERAPY DISCHARGE SUMMARY  Visits from Start of Care: 28  Current functional level related to goals / functional outcomes: See 'Short Term Goals' and 'Long Term Goals' above   Remaining deficits: Limitations w/ FMC/dexterity, LUE coordination and strength; ataxia; decreased ROM of L shoulder due to arthritis; pain and neuropathy in LUE   Education / Equipment: Joint protection, compensatory strategies for BADLs, BUE HEP for strength and coordination  Plan: Patient agrees to discharge.  Patient goals were partially met. Patient is being discharged due to  being pleased with her current functional level and plateau in progress/meeting Jackson LTGs ?????      Visit Diagnosis: Ataxia  Hemiplegia and hemiparesis following cerebral infarction affecting left non-dominant side (HCC)  Muscle weakness (generalized)  Other lack of coordination    Problem List Patient Active Problem List   Diagnosis Date Noted  . Orthostatic hypotension 10/15/2020  .  Presbycusis of both ears 03/08/2020  . Mixed stress and urge urinary incontinence 12/02/2019  . Macrocytosis 12/01/2019  . Nutritional anemia 12/01/2019  . S/P total knee replacement 01/27/2019  . Recurrent left knee instability 07/05/2018  . Respiratory failure with  hypoxia ([place]) 08/30/2017  . Hypoxemia   . Heart failure with preserved ejection fraction ([place]), Grade 3 diastolic dysfunction 90/97/5295  . PAF (paroxysmal atrial fibrillation) ([place])   . Dyspnea 03/19/2017  . Encounter for preventive health examination 02/17/2016  . Sensorineural hearing loss (SNHL), bilateral 01/26/2016  . Morbid obesity ([place]) 06/17/2015  . Hypomagnesemia 04/24/2014  . Hemiparesis affecting left side as late effect of cerebrovascular accident ([place]) 04/24/2014  . Nontraumatic cerebral hemorrhage ([place]) 04/30/2012  . DM (diabetes mellitus) with complications ([place]) 53/97/1410  . OSTEOPENIA 01/21/2009  . UNSPECIFIED VITAMIN D DEFICIENCY 11/19/2007  . HYPERCHOLESTEROLEMIA 10/25/2006  . GASTROESOPHAGEAL REFLUX, NO ESOPHAGITIS 10/25/2006  . DIVERTICULOSIS OF COLON 10/25/2006  . Osteoarthritis 10/25/2006  . CERVICAL SPINE DISORDER, NOS 10/25/2006     Kathrine Cords, OTR/L, MSOT 01/27/2021, 4:01 PM  [place]. [place], [place], [address] Phone: [phone]   Fax:  [phone]  Name: Karen Jackson MRN: [id number] Date of Birth: 03-22-1941

## 2021-02-22 ENCOUNTER — Other Ambulatory Visit: Payer: Self-pay

## 2021-02-22 ENCOUNTER — Ambulatory Visit (INDEPENDENT_AMBULATORY_CARE_PROVIDER_SITE_OTHER): Payer: Medicare Other | Admitting: Podiatry

## 2021-02-22 DIAGNOSIS — M79609 Pain in unspecified limb: Secondary | ICD-10-CM

## 2021-02-22 DIAGNOSIS — B351 Tinea unguium: Secondary | ICD-10-CM | POA: Diagnosis not present

## 2021-02-22 NOTE — Progress Notes (Signed)
    Subjective:  Patient ID: AMINATA BUFFALO, female    DOB: [birth year],  MRN: [id number]  Chief Complaint  Patient presents with   debride    DFC -FBS: dont check A1C: 5 PCP: [facility] x Jan     79 y.o. female presents with the above complaint. History confirmed with patient.   Objective:  Physical Exam: warm, good capillary refill, nail exam onychomycosis of the toenails - left hallux partial lysis, no trophic changes or ulcerative lesions. DP pulses palpable, PT pulses palpable and protective sensation intact   No images are attached to the encounter.  Assessment:   1. Pain due to onychomycosis of nail    Plan:  Patient was evaluated and treated and all questions answered.  Onychomycosis  With partial left hallux lysis -Continue [place] -Nails courtesy debrided today  -F/u in 3 months  Return in about 3 months (around 05/25/2021).

## 2021-02-24 ENCOUNTER — Other Ambulatory Visit: Payer: Self-pay

## 2021-02-24 DIAGNOSIS — E118 Type 2 diabetes mellitus with unspecified complications: Secondary | ICD-10-CM

## 2021-02-25 ENCOUNTER — Telehealth: Payer: Self-pay

## 2021-02-25 MED ORDER — OZEMPIC (0.25 OR 0.5 MG/DOSE) 2 MG/1.5ML ~~LOC~~ SOPN: 0.5000 mg | PEN_INJECTOR | SUBCUTANEOUS | 0 refills | Status: DC

## 2021-02-25 NOTE — Telephone Encounter (Signed)
Patient calls nurse line regarding rx for Ozempic. Patient reports that she and provider discussed increasing dosage to 1 mg weekly.   Please advise.   Talbot Grumbling, RN

## 2021-03-01 NOTE — Telephone Encounter (Signed)
Called.  Currently not taking ozempic because she is in the donut hole and cost is $400 per month.  She will restart at some point.  Restart at the lower dose to avoid side effects.  We can then build to the 1mg  dose.

## 2021-03-10 ENCOUNTER — Telehealth (INDEPENDENT_AMBULATORY_CARE_PROVIDER_SITE_OTHER): Payer: Medicare Other | Admitting: Family Medicine

## 2021-03-10 ENCOUNTER — Other Ambulatory Visit: Payer: Self-pay

## 2021-03-10 ENCOUNTER — Emergency Department (HOSPITAL_BASED_OUTPATIENT_CLINIC_OR_DEPARTMENT_OTHER)
Admission: EM | Admit: 2021-03-10 | Discharge: 2021-03-10 | Disposition: A | Discharge status: Home / Self Care | Payer: Medicare Other | Attending: Emergency Medicine | Admitting: Emergency Medicine

## 2021-03-10 ENCOUNTER — Telehealth: Payer: Self-pay

## 2021-03-10 ENCOUNTER — Emergency Department (HOSPITAL_BASED_OUTPATIENT_CLINIC_OR_DEPARTMENT_OTHER): Payer: Medicare Other

## 2021-03-10 ENCOUNTER — Encounter (HOSPITAL_BASED_OUTPATIENT_CLINIC_OR_DEPARTMENT_OTHER): Payer: Self-pay

## 2021-03-10 ENCOUNTER — Telehealth (HOSPITAL_COMMUNITY): Payer: Self-pay

## 2021-03-10 DIAGNOSIS — Z96652 Presence of left artificial knee joint: Secondary | ICD-10-CM | POA: Insufficient documentation

## 2021-03-10 DIAGNOSIS — I5031 Acute diastolic (congestive) heart failure: Secondary | ICD-10-CM | POA: Diagnosis not present

## 2021-03-10 DIAGNOSIS — R42 Dizziness and giddiness: Secondary | ICD-10-CM | POA: Insufficient documentation

## 2021-03-10 DIAGNOSIS — I110 Hypertensive heart disease with heart failure: Secondary | ICD-10-CM | POA: Diagnosis not present

## 2021-03-10 DIAGNOSIS — Z87891 Personal history of nicotine dependence: Secondary | ICD-10-CM | POA: Diagnosis not present

## 2021-03-10 DIAGNOSIS — Z7982 Long term (current) use of aspirin: Secondary | ICD-10-CM | POA: Diagnosis not present

## 2021-03-10 DIAGNOSIS — Z79899 Other long term (current) drug therapy: Secondary | ICD-10-CM | POA: Insufficient documentation

## 2021-03-10 DIAGNOSIS — Z20822 Contact with and (suspected) exposure to covid-19: Secondary | ICD-10-CM | POA: Insufficient documentation

## 2021-03-10 DIAGNOSIS — R29810 Facial weakness: Secondary | ICD-10-CM | POA: Diagnosis not present

## 2021-03-10 DIAGNOSIS — E119 Type 2 diabetes mellitus without complications: Secondary | ICD-10-CM | POA: Insufficient documentation

## 2021-03-10 DIAGNOSIS — R0602 Shortness of breath: Secondary | ICD-10-CM | POA: Diagnosis not present

## 2021-03-10 DIAGNOSIS — R4781 Slurred speech: Secondary | ICD-10-CM | POA: Diagnosis not present

## 2021-03-10 DIAGNOSIS — I4891 Unspecified atrial fibrillation: Secondary | ICD-10-CM | POA: Diagnosis not present

## 2021-03-10 DIAGNOSIS — I10 Essential (primary) hypertension: Secondary | ICD-10-CM | POA: Diagnosis not present

## 2021-03-10 LAB — COMPREHENSIVE METABOLIC PANEL
ALT: 13 U/L (ref 0–44)
AST: 20 U/L (ref 15–41)
Albumin: 4.1 g/dL (ref 3.5–5.0)
Alkaline Phosphatase: 69 U/L (ref 38–126)
Anion gap: 8 (ref 5–15)
BUN: 14 mg/dL (ref 8–23)
CO2: 32 mmol/L (ref 22–32)
Calcium: 9.8 mg/dL (ref 8.9–10.3)
Chloride: 97 mmol/L — ABNORMAL LOW (ref 98–111)
Creatinine, Ser: 0.64 mg/dL (ref 0.44–1.00)
GFR, Estimated: >60 mL/min (ref >60)
Glucose, Bld: 136 mg/dL — ABNORMAL HIGH (ref 70–99)
Potassium: 4 mmol/L (ref 3.5–5.1)
Sodium: 137 mmol/L (ref 135–145)
Total Bilirubin: 0.6 mg/dL (ref 0.3–1.2)
Total Protein: 6.7 g/dL (ref 6.5–8.1)

## 2021-03-10 LAB — URINALYSIS, MICROSCOPIC (REFLEX)

## 2021-03-10 LAB — RESP PANEL BY RT-PCR (FLU A&B, COVID) ARPGX2
Influenza A by PCR: NEGATIVE
Influenza B by PCR: NEGATIVE
SARS Coronavirus 2 by RT PCR: NEGATIVE

## 2021-03-10 LAB — CBC WITH DIFFERENTIAL/PLATELET
Abs Immature Granulocytes: 0.02 10*3/uL (ref 0.00–0.07)
Basophils Absolute: 0 10*3/uL (ref 0.0–0.1)
Basophils Relative: 0 %
Eosinophils Absolute: 0.1 10*3/uL (ref 0.0–0.5)
Eosinophils Relative: 2 %
HCT: 42.7 % (ref 36.0–46.0)
Hemoglobin: 14.4 g/dL (ref 12.0–15.0)
Immature Granulocytes: 0 %
Lymphocytes Relative: 19 %
Lymphs Abs: 1.2 10*3/uL (ref 0.7–4.0)
MCH: 32.1 pg (ref 26.0–34.0)
MCHC: 33.7 g/dL (ref 30.0–36.0)
MCV: 95.3 fL (ref 80.0–100.0)
Monocytes Absolute: 0.6 10*3/uL (ref 0.1–1.0)
Monocytes Relative: 10 %
Neutro Abs: 4.5 10*3/uL (ref 1.7–7.7)
Neutrophils Relative %: 69 %
Platelets: 296 10*3/uL (ref 150–400)
RBC: 4.48 MIL/uL (ref 3.87–5.11)
RDW: 13 % (ref 11.5–15.5)
WBC: 6.5 10*3/uL (ref 4.0–10.5)
nRBC: 0 % (ref 0.0–0.2)

## 2021-03-10 LAB — URINALYSIS, ROUTINE W REFLEX MICROSCOPIC
Bilirubin Urine: NEGATIVE
Glucose, UA: NEGATIVE mg/dL
Hgb urine dipstick: NEGATIVE
Ketones, ur: NEGATIVE mg/dL
Nitrite: NEGATIVE
Protein, ur: NEGATIVE mg/dL
Specific Gravity, Urine: <1.005 — ABNORMAL LOW (ref 1.005–1.030)
pH: 7 (ref 5.0–8.0)

## 2021-03-10 LAB — TROPONIN I (HIGH SENSITIVITY): Troponin I (High Sensitivity): 8 ng/L (ref <18)

## 2021-03-10 LAB — BRAIN NATRIURETIC PEPTIDE: B Natriuretic Peptide: 144.7 pg/mL — ABNORMAL HIGH (ref 0.0–100.0)

## 2021-03-10 MED ORDER — METOPROLOL TARTRATE 25 MG PO TABS
25.0000 mg | ORAL_TABLET | Freq: Once | ORAL | Status: AC
  Administered 2021-03-10: 25 mg via ORAL
  Filled 2021-03-10: qty 1

## 2021-03-10 MED ORDER — ASPIRIN EC 81 MG PO TBEC: 81.0000 mg | DELAYED_RELEASE_TABLET | Freq: Every day | ORAL | 0 refills | Status: DC

## 2021-03-10 MED ORDER — ASPIRIN EC 81 MG PO TBEC
81.0000 mg | DELAYED_RELEASE_TABLET | Freq: Once | ORAL | Status: AC
  Administered 2021-03-10: 81 mg via ORAL
  Filled 2021-03-10: qty 1

## 2021-03-10 MED ORDER — METOPROLOL TARTRATE 25 MG PO TABS: 25.0000 mg | ORAL_TABLET | Freq: Two times a day (BID) | ORAL | 0 refills | Status: DC

## 2021-03-10 NOTE — Telephone Encounter (Signed)
Contacted patient regarding follow up appointment from the ED. She is scheduled on 7/20 @ 9:30am to see Karen Jackson at the A-fib clinic. She was given directions and the parking code to access the garage. Patient verbalized understanding.

## 2021-03-10 NOTE — ED Notes (Signed)
Patient transported to CT 

## 2021-03-10 NOTE — ED Provider Notes (Signed)
[facility] EMERGENCY DEPARTMENT Provider Note   CSN: [id number] Arrival date & time: 03/10/21  1206     History Chief Complaint  Patient presents with   Hypertension    Karen Jackson is a 80 y.o. female.  Patient here for some episodes of dizzy spells, blurred vision.  No symptoms now.  Has had some shortness of breath.  Has a history of atrial fibrillation but not on blood thinners.  History of hemorrhagic stroke in the past.  She was concerned because her blood pressure was high during these episodes.  She has a history of heart failure.  She talked with her primary care doctor and described today's episodes and they sent her for evaluation.  The history is provided by the patient.  Illness Severity:  Mild Onset quality:  Gradual Timing:  Intermittent Progression:  Waxing and waning Chronicity:  New Associated symptoms: cough and shortness of breath   Associated symptoms: no abdominal pain, no chest pain, no congestion, no ear pain, no fever, no headaches, no rash, no sore throat and no vomiting       Past Medical History:  Diagnosis Date   Acute cystitis without hematuria    Acute diastolic CHF (congestive heart failure) (HCC)    Arthritis    Dyspnea    Dysrhythmia    Fever of unknown origin 03/19/2017   Hyperlipidemia    Hypertension    denies at preop   Multifocal pneumonia    Neuromuscular disorder (HCC)    neuropathy left arm and foot   Osteopenia    Paralysis (HCC)    partial left side from CVA    Persistent atrial fibrillation (HCC)    PONV (postoperative nausea and vomiting)    Pre-diabetes    Stroke [facility]) 2013   hemmorahgic    Patient Active Problem List   Diagnosis Date Noted   Orthostatic hypotension 10/15/2020   Presbycusis of both ears 03/08/2020   Mixed stress and urge urinary incontinence 12/02/2019   Macrocytosis 12/01/2019   Nutritional anemia 12/01/2019   S/P total knee replacement 01/27/2019   Recurrent left knee instability  07/05/2018   Respiratory failure with hypoxia ([place]) 08/30/2017   Hypoxemia    Heart failure with preserved ejection fraction (HCC), Grade 3 diastolic dysfunction 88/50/2774   PAF (paroxysmal atrial fibrillation) ([place])    Dyspnea 03/19/2017   Encounter for preventive health examination 02/17/2016   Sensorineural hearing loss (SNHL), bilateral 01/26/2016   Morbid obesity ([place]) 06/17/2015   Hypomagnesemia 04/24/2014   Hemiparesis affecting left side as late effect of cerebrovascular accident ([place]) 04/24/2014   Nontraumatic cerebral hemorrhage ([place]) 04/30/2012   DM (diabetes mellitus) with complications ([place]) 12/87/8676   OSTEOPENIA 01/21/2009   UNSPECIFIED VITAMIN D DEFICIENCY 11/19/2007   HYPERCHOLESTEROLEMIA 10/25/2006   GASTROESOPHAGEAL REFLUX, NO ESOPHAGITIS 10/25/2006   DIVERTICULOSIS OF COLON 10/25/2006   Osteoarthritis 10/25/2006   CERVICAL SPINE DISORDER, NOS 10/25/2006    Past Surgical History:  Procedure Laterality Date   ANKLE SURGERY     APPENDECTOMY     CHOLECYSTECTOMY     HERNIA REPAIR     Esophagus   JOINT REPLACEMENT     total- right partial- left   MASTECTOMY PARTIAL / LUMPECTOMY  2012   left   ORIF ANKLE FRACTURE Left 07/20/2018   Procedure: OPEN REDUCTION INTERNAL FIXATION (ORIF) ANKLE FRACTURE;  Surgeon: Wylene Simmer, MD;  Location: [facility];  Service: Orthopedics;  Laterality: Left;   TOTAL KNEE ARTHROPLASTY Left 01/27/2019   Procedure: TOTAL  KNEE ARTHROPLASTY;  Surgeon: Vickey Huger, MD;  Location: [facility];  Service: Orthopedics;  Laterality: Left;     OB History   No obstetric history on file.     Family History  Problem Relation Age of Onset   Diabetes Mother     Social History   Tobacco Use   Smoking status: Former    Packs/day: 0.50    Years: 23.00    Pack years: 11.50    Types: Cigarettes    Start date: 08/28/1957    Quit date: 03/09/1981    Years since quitting: 40.0   Smokeless tobacco: Never  Vaping Use   Vaping Use: Never used   Substance Use Topics   Alcohol use: Yes    Alcohol/week: 15.0 standard drinks    Types: 1 Glasses of wine, 14 Standard drinks or equivalent per week    Comment: daily one glass   Drug use: No    Home Medications Prior to Admission medications   Medication Sig Start Date End Date Taking? Authorizing Provider  aspirin EC 81 MG tablet Take 1 tablet (81 mg total) by mouth daily. Swallow whole. 03/10/21 04/09/21 Yes , , DO  metoprolol tartrate (LOPRESSOR) 25 MG tablet Take 1 tablet (25 mg total) by mouth 2 (two) times daily. 03/10/21 04/09/21 Yes , , DO  albuterol (VENTOLIN HFA) 108 (90 Base) MCG/ACT inhaler Inhale 2 puffs into the lungs every 6 (six) hours as needed for wheezing or shortness of breath. 05/11/20   Zenia Resides, MD  atorvastatin (LIPITOR) 20 MG tablet TAKE 1 TABLET BY MOUTH  DAILY 11/22/20   Zenia Resides, MD  Calcium Carb-Cholecalciferol (CALCIUM CARBONATE-VITAMIN D3) 600-400 MG-UNIT TABS Take 1 tablet by mouth daily. 04/27/14   Zenia Resides, MD  celecoxib (CELEBREX) 200 MG capsule TAKE 1 CAPSULE BY MOUTH  DAILY AS NEEDED FOR  ARTHRITIS PAIN 11/22/20   Zenia Resides, MD  ciclopirox (PENLAC) 8 % solution Apply topically at bedtime. Apply over nail and surrounding skin. Apply daily over previous coat. Remove weekly with file or polish remover. 07/06/20   Evelina Bucy, DPM  diclofenac Sodium (VOLTAREN) 1 % GEL APPLY 2 GRAMS EXTERNALLY TO THE AFFECTED AREA FOUR TIMES DAILY 01/14/16   [provider]  docusate sodium (COLACE) 100 MG capsule Take 1 capsule (100 mg total) by mouth 2 (two) times daily. While taking narcotic pain medicine. Patient taking differently: Take 100 mg by mouth daily as needed for moderate constipation. While taking narcotic pain medicine. 07/22/18   Corky Sing, PA-C  ergocalciferol (VITAMIN D2) 1.25 MG (50000 UT) capsule 50,000 unit 09/02/08   [provider]  fluocinonide (LIDEX) 0.05 % external  solution APPLY 1 ML TOPICALLY TO THE SCALP TWICE DAILY 01/20/20   [provider]  furosemide (LASIX) 80 MG tablet TAKE 1 TABLET BY MOUTH  DAILY 11/22/20   Zenia Resides, MD  gabapentin (NEURONTIN) 100 MG capsule TAKE 1 CAPSULE BY MOUTH 3  TIMES DAILY 11/22/20   Zenia Resides, MD  hydrocortisone 2.5 % cream Apply topically 2 (two) times daily as needed. 06/28/20   [provider]  lidocaine (LIDODERM) 5 % Place 1 patch onto the skin daily. Remove & Discard patch within 12 hours or as directed by MD 11/10/20   Zenia Resides, MD  MAGNESIUM-OXIDE 400 (241.3 Mg) MG tablet TAKE 1 TABLET BY MOUTH DAILY 09/17/20   Zenia Resides, MD  Multiple Vitamins-Minerals (HAIR/SKIN/NAILS) CAPS Take 3 capsules by mouth  daily.    [provider]  nitrofurantoin, macrocrystal-monohydrate, (MACROBID) 100 MG capsule  09/01/09   [provider]  omeprazole (PRILOSEC) 20 MG capsule TAKE 1 CAPSULE BY MOUTH  DAILY 11/22/20   Zenia Resides, MD  Semaglutide,0.25 or 0.5MG /DOS, (OZEMPIC, 0.25 OR 0.5 MG/DOSE,) 2 MG/1.5ML SOPN Inject 0.5 mg into the skin once a week. 02/25/21   Dickie La, MD  senna (SENOKOT) 8.6 MG TABS tablet Take 2 tablets (17.2 mg total) by mouth 2 (two) times daily. Patient taking differently: Take 2 tablets by mouth daily as needed for moderate constipation.  07/22/18   Corky Sing, PA-C  traMADol (ULTRAM) 50 MG tablet tramadol 50 mg tablet    [provider]  traZODone (DESYREL) 50 MG tablet TAKE 1 TABLET BY MOUTH AT  BEDTIME 11/22/20   Zenia Resides, MD  TURMERIC PO Take 1 capsule by mouth daily.    [provider]    Allergies    Codeine phosphate and Simvastatin  Review of Systems   Review of Systems  Constitutional:  Negative for chills and fever.  HENT:  Negative for congestion, ear pain and sore throat.   Eyes:  Positive for visual disturbance. Negative for pain.  Respiratory:  Positive for cough and shortness of breath.    Cardiovascular:  Negative for chest pain and palpitations.  Gastrointestinal:  Negative for abdominal pain and vomiting.  Genitourinary:  Negative for dysuria and hematuria.  Musculoskeletal:  Negative for arthralgias and back pain.  Skin:  Negative for color change and rash.  Neurological:  Positive for dizziness. Negative for tremors, seizures, syncope, facial asymmetry, speech difficulty, weakness (left side weakness chronic), light-headedness, numbness and headaches.  All other systems reviewed and are negative.  Physical Exam Updated Vital Signs BP (!) 151/60   Pulse 97   Temp 98.6 F (37 C) (Oral)   Resp 19   Ht 4\' 9"  (1.448 m)   Wt 78 kg   SpO2 96%   BMI 37.22 kg/m   Physical Exam Vitals and nursing note reviewed.  Constitutional:      General: She is not in acute distress.    Appearance: She is well-developed. She is not ill-appearing.  HENT:     Head: Normocephalic and atraumatic.     Nose: Nose normal.     Mouth/Throat:     Mouth: Mucous membranes are moist.  Eyes:     Extraocular Movements: Extraocular movements intact.     Conjunctiva/sclera: Conjunctivae normal.     Pupils: Pupils are equal, round, and reactive to light.  Cardiovascular:     Rate and Rhythm: Tachycardia present. Rhythm irregular.     Heart sounds: No murmur heard. Pulmonary:     Effort: Pulmonary effort is normal. No respiratory distress.     Breath sounds: Normal breath sounds.  Abdominal:     Palpations: Abdomen is soft.     Tenderness: There is no abdominal tenderness.  Musculoskeletal:     Cervical back: Neck supple.  Skin:    General: Skin is warm and dry.     Capillary Refill: Capillary refill takes less than 2 seconds.  Neurological:     General: No focal deficit present.     Mental Status: She is alert and oriented to person, place, and time.     Sensory: No sensory deficit.     Motor: No weakness.     Coordination: Coordination normal.     Comments: Mild left-sided  weakness at baseline  but overall strength and sensation are intact, no visual field deficit, normal speech    ED Results / Procedures / Treatments   Labs (all labs ordered are listed, but only abnormal results are displayed) Labs Reviewed  COMPREHENSIVE METABOLIC PANEL - Abnormal; Notable for the following components:      Result Value   Chloride 97 (*)    Glucose, Bld 136 (*)    All other components within normal limits  URINALYSIS, ROUTINE W REFLEX MICROSCOPIC - Abnormal; Notable for the following components:   Specific Gravity, Urine <1.005 (*)    Leukocytes,Ua MODERATE (*)    All other components within normal limits  BRAIN NATRIURETIC PEPTIDE - Abnormal; Notable for the following components:   B Natriuretic Peptide 144.7 (*)    All other components within normal limits  URINALYSIS, MICROSCOPIC (REFLEX) - Abnormal; Notable for the following components:   Bacteria, UA FEW (*)    All other components within normal limits  RESP PANEL BY RT-PCR (FLU A&B, COVID) ARPGX2  CBC WITH DIFFERENTIAL/PLATELET  TROPONIN I (HIGH SENSITIVITY)    EKG EKG Interpretation  Date/Time:  Thursday March 10 2021 12:18:10 EDT Ventricular Rate:  106 PR Interval:    QRS Duration: 81 QT Interval:  363 QTC Calculation: 482 R Axis:   90 Text Interpretation: Atrial fibrillation Ventricular premature complex Borderline right axis deviation Borderline low voltage, extremity leads Confirmed by Lennice Sites ([id number]) on 03/10/2021 12:32:57 PM  Radiology CT Head Wo Contrast  Result Date: 03/10/2021 CLINICAL DATA:  Dizziness, hypertension EXAM: CT HEAD WITHOUT CONTRAST TECHNIQUE: Contiguous axial images were obtained from the base of the skull through the vertex without intravenous contrast. COMPARISON:  MRI head 03/21/2017 FINDINGS: Brain: Generalized atrophy. Chronic microvascular ischemic change throughout the white matter, moderate in degree. Chronic infarct right thalamus unchanged. Negative for acute  infarct, hemorrhage, mass Vascular: Negative for hyperdense vessel Skull: Negative Sinuses/Orbits: Paranasal sinuses clear. Bilateral cataract extraction Other: None IMPRESSION: Atrophy and chronic microvascular ischemic change. No acute abnormality Electronically Signed   By: Franchot Gallo M.D.   On: 03/10/2021 13:26   DG Chest Portable 1 View  Result Date: 03/10/2021 CLINICAL DATA:  Short of breath EXAM: PORTABLE CHEST 1 VIEW COMPARISON:  05/10/2017 FINDINGS: Cardiac enlargement without heart failure. Atherosclerotic calcification aorta. Negative for pneumonia or effusion. Degenerative change in both shoulders. IMPRESSION: No active disease. Electronically Signed   By: Franchot Gallo M.D.   On: 03/10/2021 13:24    Procedures Procedures   Medications Ordered in ED Medications  metoprolol tartrate (LOPRESSOR) tablet 25 mg (25 mg Oral Given 03/10/21 1421)  aspirin EC tablet 81 mg (81 mg Oral Given 03/10/21 1421)    ED Course  I have reviewed the triage vital signs and the nursing notes.  Pertinent labs & imaging results that were available during my care of the patient were reviewed by me and considered in my medical decision making (see chart for details).    MDM Rules/Calculators/A&P                          MLISSA TAMAYO is here with dizziness, cough, shortness of breath.  Concern for high blood pressure.  Blood pressure 150/60 but otherwise normal vitals.  Found to have A. fib in the 90s.  History of atrial fibrillation but not on anticoagulation.  She is unsure why.  She has a history of hemorrhagic stroke in the past.  History of heart failure and hypertension.  CHA2DS2-VASc score 2 is 8.  Neurologically she is intact.  She has chronic left-sided weakness from old hemorrhagic stroke.  She has cough and shortness of breath but overall states that is chronic.  She is mostly concerned about her high blood pressure.  She is not having headache.  Head CT was normal.  No stroke symptoms on exam.   Doubt stroke.  No significant anemia, electrolyte ab malady, kidney injury, electrolyte abnormality otherwise.  Chest x-ray negative for infection.  Urinalysis negative for infection.  Troponin and BNP overall unremarkable.  Talked with Dr. [name] with cardiology regarding her anticoagulation and atrial fibrillation and upon chart review he was able to find old cardiology note from 4 years ago that states that patient was not a candidate for anticoagulation.  Suspect this may be secondary to her functional status and from her prior stroke.  He recommended starting her on a daily aspirin and 25 mg of metoprolol twice a day.  We will have her follow-up in atrial fibrillation clinic.  Discharged in ED in good condition.  This chart was dictated using voice recognition software.  Despite best efforts to proofread,  errors can occur which can change the documentation meaning.   CHA2DS2-VASc Score = 8  The patient's score is based upon: CHF History: Yes HTN History: Yes Diabetes History: Yes Stroke History: Yes Vascular Disease History: No Age Score: 2 Gender Score: 1     ASSESSMENT AND PLAN: Paroxysmal Atrial Fibrillation (ICD10:  I48.0) The patient's CHA2DS2-VASc score is 8, indicating a 10.8% annual risk of stroke.        Karen Milling, DO    03/10/2021 2:43 PM       Final Clinical Impression(s) / ED Diagnoses Final diagnoses:  Atrial fibrillation, unspecified type ([place])  Dizziness    Rx / DC Orders ED Discharge Orders          Ordered    metoprolol tartrate (LOPRESSOR) 25 MG tablet  2 times daily        03/10/21 1439    aspirin EC 81 MG tablet  Daily        03/10/21 1439             , Quita Skye, DO 03/10/21 1443

## 2021-03-10 NOTE — Discharge Instructions (Addendum)
Start taking an 81 mg aspirin daily, 25 mg of metoprolol twice a day .  Follow-up in atrial fibrillation clinic.

## 2021-03-10 NOTE — Telephone Encounter (Signed)
Patient calls nurse line stating she has been experiencing dizziness and vision changes. Patient reports the symptoms only last about 4 minutes and then go away. This has been happening on and off since last week. I asked patients caregiver to check her BP, 156/96. Patient denies chest pain, SOB, weakness, or slurred speech. Caregiver reports she has not noticed any facial changes in patient. Patient is speaking normally in full sentences. ED precautions given to patient and caregiver.   Virtual apt scheduled for this morning at 10:50am for next steps.

## 2021-03-10 NOTE — ED Triage Notes (Signed)
Pt arrives via [facility] from home with c/o high blood pressure pt rpeorts she had some readings in the 170's and 150's. States that she called her doctor and told them about BP and intermittent blurred vision and dizziness over the last week. Pt free from complaints upon arrival to ED.  VS with EMS - 134/80, 70 HR, 95% RA, 161 CBG.   Pt states that she is not diabetic, reports prediabetes. Pt is with her caregiver Shirlean Mylar.

## 2021-03-10 NOTE — Assessment & Plan Note (Signed)
Broad differential for dizziness and blurred vision including PCA stroke, A fib, orthostatic hypotension etc. Given hx of hemorrhagic stroke recommended pt go to the ER urgently for further work up. She said she will call the EMS. I have called the ER charge RN and let [facility] know about possible admission.

## 2021-03-10 NOTE — Progress Notes (Addendum)
[place] [facility] Telemedicine Visit  Patient consented to have virtual visit and was identified by name and date of birth. Method of visit: video was attempted   Encounter participants: Patient: TAKERRA LUPINACCI - located at home Provider: Lattie Haw - located at home   Chief Complaint: blurred vision  HPI:  Dizziness Blurred vision Pt reports blurred vision and dizziness for 1 week. The last episode of dizziness was this morning lasted for a few minutes. She had blurred vision twice last week and once this week. BP 295 and 621 systolic taken by caregiver. Denies painful vision, floaters, headache, speech changes, limb weakness, facial drooping, ataxia, falls, chest pain, dyspnea, coughing, fevers, urinary sx, abdominal pain etc. Pt is wheelchair bound Takes Lasix 80mg  twice weekly. No other anti-hypertensives. Dr Andria Frames recently took her off metoprolol due to orthostatic hypotension.  Pt has a history of a stroke 9 years ago. Her main symptoms were left sided limb weakness and facial drooping at this time. Denies eye conditions such as glaucoma, cataracts or diabetic retinopathy.  ROS: per HPI  Pertinent PMHx: morbid obesity  Exam:  There were no vitals taken for this visit.  Respiratory: speaking in full sentences  Assessment/Plan:  Dizziness Broad differential for dizziness and blurred vision including PCA stroke, A fib, orthostatic hypotension etc. Given hx of hemorrhagic stroke recommended pt go to the ER urgently for further work up. She said she will call the EMS. I have called the ER charge RN and let [facility]PT[facility] know about possible admission.    Time spent during visit with patient: 15 minutes

## 2021-03-16 ENCOUNTER — Encounter (HOSPITAL_COMMUNITY): Payer: Self-pay | Admitting: Physician Assistant

## 2021-03-16 ENCOUNTER — Other Ambulatory Visit: Payer: Self-pay

## 2021-03-16 ENCOUNTER — Ambulatory Visit (HOSPITAL_COMMUNITY)
Admission: RE | Admit: 2021-03-16 | Discharge: 2021-03-16 | Disposition: A | Discharge status: Home / Self Care | Payer: Medicare Other | Source: Ambulatory Visit | Attending: Physician Assistant | Admitting: Physician Assistant

## 2021-03-16 VITALS — BP 130/72 | HR 101 | Ht <= 58 in | Wt 170.8 lb

## 2021-03-16 DIAGNOSIS — D6869 Other thrombophilia: Secondary | ICD-10-CM | POA: Diagnosis not present

## 2021-03-16 DIAGNOSIS — I4892 Unspecified atrial flutter: Secondary | ICD-10-CM | POA: Diagnosis not present

## 2021-03-16 DIAGNOSIS — Z79899 Other long term (current) drug therapy: Secondary | ICD-10-CM | POA: Diagnosis not present

## 2021-03-16 DIAGNOSIS — Z6836 Body mass index (BMI) 36.0-36.9, adult: Secondary | ICD-10-CM | POA: Diagnosis not present

## 2021-03-16 DIAGNOSIS — E669 Obesity, unspecified: Secondary | ICD-10-CM | POA: Diagnosis not present

## 2021-03-16 DIAGNOSIS — Z87891 Personal history of nicotine dependence: Secondary | ICD-10-CM | POA: Insufficient documentation

## 2021-03-16 DIAGNOSIS — Z7901 Long term (current) use of anticoagulants: Secondary | ICD-10-CM | POA: Diagnosis not present

## 2021-03-16 DIAGNOSIS — I4819 Other persistent atrial fibrillation: Secondary | ICD-10-CM | POA: Diagnosis not present

## 2021-03-16 DIAGNOSIS — Z713 Dietary counseling and surveillance: Secondary | ICD-10-CM | POA: Diagnosis not present

## 2021-03-16 DIAGNOSIS — I10 Essential (primary) hypertension: Secondary | ICD-10-CM | POA: Diagnosis not present

## 2021-03-16 NOTE — Progress Notes (Signed)
Primary Care Physician: Zenia Resides, MD Primary Cardiologist: Dr Ellyn Hack (remotely) Primary Electrophysiologist: none Referring Physician: [facility]   Karen Jackson is a 80 y.o. female with a history of nontraumatic hemorrhagic stroke in 2013, DM, HTN, HLD, atrial flutter, and atrial fibrillation who presents for consultation in the [facility].  The patient was initially diagnosed with atrial fibrillation in 2018 during an admission for CAP and sepsis. She was in afib with RVR on presentation. Her case was reviewed by cardiology and neurology and she was determined to NOT be a candidate for anticoagulation. Patient has a CHADS2VASC score of 8. She did not follow up with cardiology post discharge. She was seen at the ED 03/10/21 with episodes of dizziness, cough, and blurred vision. She was in afib at the time. Workup at that time was unremarkable. She is still in afib and completely unaware of her arrhythmia. She does report that her metoprolol had recently been stopped.   Today, she denies symptoms of palpitations, chest pain, shortness of breath, orthopnea, PND, lower extremity edema, dizziness, presyncope, syncope, snoring, daytime somnolence, bleeding. The patient is tolerating medications without difficulties and is otherwise without complaint today.    Atrial Fibrillation Risk Factors:  she does not have symptoms or diagnosis of sleep apnea. she does not have a history of rheumatic fever.   she has a BMI of Body mass index is 36.96 kg/m.Marland Kitchen Filed Weights   03/16/21 0928  Weight: 77.5 kg    Family History  Problem Relation Age of Onset   Diabetes Mother      Atrial Fibrillation Management history:  Previous antiarrhythmic drugs: none Previous cardioversions: none Previous ablations: none CHADS2VASC score: 8 Anticoagulation history: none   Past Medical History:  Diagnosis Date   Acute cystitis without hematuria    Acute diastolic  CHF (congestive heart failure) (HCC)    Arthritis    Dyspnea    Dysrhythmia    Fever of unknown origin 03/19/2017   Hyperlipidemia    Hypertension    denies at preop   Multifocal pneumonia    Neuromuscular disorder (HCC)    neuropathy left arm and foot   Osteopenia    Paralysis (HCC)    partial left side from CVA    Persistent atrial fibrillation (HCC)    PONV (postoperative nausea and vomiting)    Pre-diabetes    Stroke [facility]) 2013   hemmorahgic   Past Surgical History:  Procedure Laterality Date   ANKLE SURGERY     APPENDECTOMY     CHOLECYSTECTOMY     HERNIA REPAIR     Esophagus   JOINT REPLACEMENT     total- right partial- left   MASTECTOMY PARTIAL / LUMPECTOMY  2012   left   ORIF ANKLE FRACTURE Left 07/20/2018   Procedure: OPEN REDUCTION INTERNAL FIXATION (ORIF) ANKLE FRACTURE;  Surgeon: Wylene Simmer, MD;  Location: [facility];  Service: Orthopedics;  Laterality: Left;   TOTAL KNEE ARTHROPLASTY Left 01/27/2019   Procedure: TOTAL KNEE ARTHROPLASTY;  Surgeon: Vickey Huger, MD;  Location[facility];  Service: Orthopedics;  Laterality: Left;    Current Outpatient Medications  Medication Sig Dispense Refill   albuterol (VENTOLIN HFA) 108 (90 Base) MCG/ACT inhaler Inhale 2 puffs into the lungs every 6 (six) hours as needed for wheezing or shortness of breath. 18 g 2   aspirin EC 81 MG tablet Take 1 tablet (81 mg total) by mouth daily. Swallow whole. 30 tablet 0  atorvastatin (LIPITOR) 20 MG tablet TAKE 1 TABLET BY MOUTH  DAILY 90 tablet 3   Calcium Carb-Cholecalciferol (CALCIUM CARBONATE-VITAMIN D3) 600-400 MG-UNIT TABS Take 1 tablet by mouth daily.     celecoxib (CELEBREX) 200 MG capsule TAKE 1 CAPSULE BY MOUTH  DAILY AS NEEDED FOR  ARTHRITIS PAIN 90 capsule 3   diclofenac Sodium (VOLTAREN) 1 % GEL APPLY 2 GRAMS EXTERNALLY TO THE AFFECTED AREA FOUR TIMES DAILY     ergocalciferol (VITAMIN D2) 1.25 MG (50000 UT) capsule 50,000 unit     fluocinonide (LIDEX) 0.05 % external solution  APPLY 1 ML TOPICALLY TO THE SCALP TWICE DAILY     furosemide (LASIX) 80 MG tablet TAKE 1 TABLET BY MOUTH  DAILY 90 tablet 3   gabapentin (NEURONTIN) 100 MG capsule TAKE 1 CAPSULE BY MOUTH 3  TIMES DAILY 270 capsule 3   hydrocortisone 2.5 % cream Apply topically 2 (two) times daily as needed.     lidocaine (LIDODERM) 5 % Place 1 patch onto the skin daily. Remove & Discard patch within 12 hours or as directed by MD 30 patch 2   MAGNESIUM-OXIDE 400 (241.3 Mg) MG tablet TAKE 1 TABLET BY MOUTH DAILY 90 tablet 3   metoprolol tartrate (LOPRESSOR) 25 MG tablet Take 1 tablet (25 mg total) by mouth 2 (two) times daily. 60 tablet 0   Multiple Vitamins-Minerals (HAIR/SKIN/NAILS) CAPS Take 3 capsules by mouth daily.     omeprazole (PRILOSEC) 20 MG capsule TAKE 1 CAPSULE BY MOUTH  DAILY 90 capsule 3   traZODone (DESYREL) 50 MG tablet TAKE 1 TABLET BY MOUTH AT  BEDTIME 90 tablet 3   TURMERIC PO Take 1 capsule by mouth daily.     No current facility-administered medications for this encounter.    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  Tobacco Use   Smoking status: Former    Packs/day: 0.50    Years: 23.00    Pack years: 11.50    Types: Cigarettes    Start date: 08/28/1957    Quit date: 03/09/1981    Years since quitting: 40.0   Smokeless tobacco: Never  Vaping Use   Vaping Use: Never used  Substance and Sexual Activity   Alcohol use: Yes    Alcohol/week: 16.0 standard drinks    Types: 2 Glasses of wine, 14 Standard drinks or equivalent per week    Comment: daily one glass   Drug use: No   Sexual activity: Not Currently  Other Topics Concern   Not  on file  Social History Narrative   ** Merged History Encounter **       Health Care POA: son, Allen Kell   Emergency Contact: Lelon Frohlich and Roosvelt Harps, niece & nephew (c) [phone] (h) [phone]   End of Life Plan:    Who lives with you: self   Any pets: none   Diet: Pt has a varied diet of protein, vegetables and limits "white" foods.   Exercise: Pt does water aerobics 3x week, golf 2x week   Seatbelts: Pt reports wearing seatbelt when in vehicle.    Nancy Fetter  Exposure/Protection: Pt reports wearing sun screen   Hobbies: golfing, swimming          Social Determinants of Health   Financial Resource Strain: Not on file  Food Insecurity: Not on file  Transportation Needs: Not on file  Physical Activity: Not on file  Stress: Not on file  Social Connections: Not on file  Intimate Partner Violence: Not on file     ROS- All systems are reviewed and negative except as per the HPI above.  Physical Exam: Vitals:   03/16/21 0928  BP: 130/72  Pulse: (!) 101  Weight: 77.5 kg  Height: 4\' 9"  (1.448 m)    GEN- The patient is a well appearing obese elderly female, alert and oriented x 3 today.   Head- normocephalic, atraumatic Eyes-  Sclera clear, conjunctiva pink Ears- hearing intact Oropharynx- clear Neck- supple  Lungs- Clear to ausculation bilaterally, normal work of breathing Heart- irregular rate and rhythm, no murmurs, rubs or gallops  GI- soft, NT, ND, + BS Extremities- no clubbing, cyanosis, or edema MS- no significant deformity or atrophy Skin- no rash or lesion Psych- euthymic mood, full affect Neuro- strength and sensation are intact  Wt Readings from Last 3 Encounters:  03/16/21 77.5 kg  03/10/21 78 kg  10/14/20 77.4 kg    EKG today demonstrates  Atypical atrial flutter vs coarse afib, PVC Vent. rate 101 BPM PR interval * ms QRS duration 80 ms QT/QTcB 312/404 ms  Echo 03/21/17 demonstrated  - Left ventricle: The cavity size was normal. Systolic function was     normal. The estimated ejection fraction was in the range of 60%    to 65%. Wall motion was normal; there were no regional wall    motion abnormalities. There was a reduced contribution of atrial    contraction to ventricular filling, due to increased ventricular    diastolic pressure or atrial contractile dysfunction. Doppler    parameters are consistent with a reversible restrictive pattern,    indicative of decreased left ventricular diastolic compliance    and/or increased left atrial pressure (grade 3 diastolic    dysfunction). Doppler parameters are consistent with high    ventricular filling pressure.  - Aortic valve: Poorly visualized.  - Mitral valve: Calcified annulus. There was mild regurgitation.  - Left atrium: The atrium was mildly to moderately dilated.    Anterior-posterior dimension: 48 mm.  - Right atrium: The atrium was mildly dilated.  - Pulmonic valve: Poorly visualized.  - Pulmonary arteries: Systolic pressure could not be accurately    estimated.   Epic records are reviewed at length today  CHA2DS2-VASc Score = 8  The patient's score is based upon: CHF History: Yes HTN History: Yes Diabetes History: Yes Stroke History: Yes Vascular Disease History: No Age Score: 2 Gender Score: 1      ASSESSMENT AND PLAN: 1. Persistent Atrial Fibrillation/atrial flutter The patient's CHA2DS2-VASc score is 8, indicating a 10.8% annual risk of stroke.   Patient in afib today, asymptomatic. Rates 80s-90s on recheck. Without anticoagulation, rate control is her best option.  Continue Lopressor 25 mg BID. Up titrate rate control cautiously with h/o significant bradycardia (ECG 11/11/18) and h/o hypotension.  She is not a candidate for long term anticoagulation, but her CV score is very high. ? If she would be a candidate for short term anticoagulation and [facility]. Patient agreeable for referral.  Stop ASA  2. Secondary Hypercoagulable State (ICD10:  D68.69) The patient is at  significant risk for  stroke/thromboembolism based upon her CHA2DS2-VASc Score of 8.  However, the patient is not on anticoagulation due to her high bleeding risk.     3. Obesity Body mass index is 36.96 kg/m. Lifestyle modification was discussed at length including regular exercise and weight reduction.  4. HTN Stable, no changes today.   Will refer to [facility] for consideration of procedure.    [facility] [address] [place], [place] [address] [phone] 03/16/2021 9:40 AM

## 2021-03-16 NOTE — Patient Instructions (Signed)
Stop aspirin.

## 2021-04-11 ENCOUNTER — Other Ambulatory Visit: Payer: Self-pay

## 2021-04-11 MED ORDER — METOPROLOL TARTRATE 25 MG PO TABS: 25.0000 mg | ORAL_TABLET | Freq: Two times a day (BID) | ORAL | 3 refills | Status: DC

## 2021-04-29 NOTE — Progress Notes (Signed)
Submitted application for OZEMPIC 2MG/1.5ML to [employer] for patient assistance.   Phone: [phone]  

## 2021-05-05 ENCOUNTER — Encounter: Payer: Self-pay | Admitting: Family Medicine

## 2021-05-05 ENCOUNTER — Ambulatory Visit (INDEPENDENT_AMBULATORY_CARE_PROVIDER_SITE_OTHER): Payer: Medicare Other | Admitting: Family Medicine

## 2021-05-05 ENCOUNTER — Other Ambulatory Visit: Payer: Self-pay

## 2021-05-05 VITALS — BP 112/80 | HR 80 | Ht 59.0 in | Wt 168.2 lb

## 2021-05-05 DIAGNOSIS — E118 Type 2 diabetes mellitus with unspecified complications: Secondary | ICD-10-CM

## 2021-05-05 DIAGNOSIS — J9611 Chronic respiratory failure with hypoxia: Secondary | ICD-10-CM

## 2021-05-05 DIAGNOSIS — I5032 Chronic diastolic (congestive) heart failure: Secondary | ICD-10-CM

## 2021-05-05 DIAGNOSIS — I480 Paroxysmal atrial fibrillation: Secondary | ICD-10-CM

## 2021-05-05 DIAGNOSIS — Z23 Encounter for immunization: Secondary | ICD-10-CM | POA: Diagnosis not present

## 2021-05-05 LAB — POCT GLYCOSYLATED HEMOGLOBIN (HGB A1C): HbA1c, POC (controlled diabetic range): 6.3 % (ref 0.0–7.0)

## 2021-05-05 NOTE — Patient Instructions (Addendum)
Please get someone medical/official to document you oxygen level with activity.  88% is magic number and will qualify you for home Oxygen.  Check your blood pressure at home.  I worry when the bottom number is <60.    I want to get the ozempic restarted.  That and hopefully getting home oxygen are the two changes I want to make.  There is another new diabetes family of meds, SGLT2 family, that is good for heart and kidneys.  I may want to start you on that the next visit.  Plan on seeing me when you have been on the ozempic for one month.    Flu shot today.

## 2021-05-06 ENCOUNTER — Encounter: Payer: Self-pay | Admitting: Family Medicine

## 2021-05-06 NOTE — Assessment & Plan Note (Signed)
Get her back on her ozempic - hopefully this will also help with wt loss.  She would be a good candidate for an SGLT2 inhibitor.  Consider that change next visit.

## 2021-05-06 NOTE — Assessment & Plan Note (Signed)
Seems to be at dry wt.  Sounds like she is hypoxic with activity.  Likely needs home O2 to use at night and with activity.  She will get her PT to document O2 sats for qualifications.

## 2021-05-06 NOTE — Assessment & Plan Note (Signed)
Likely contributing to her DOE and hypoxia.  Home O2

## 2021-05-06 NOTE — Assessment & Plan Note (Signed)
Metoprolol fro rate control.  Check home BP to make sure we are not dropping diastolics too much.

## 2021-05-06 NOTE — Progress Notes (Signed)
    SUBJECTIVE:   CHIEF COMPLAINT / HPI:   Multiple issues More DOE.  Used to have home O2.  No longer does.  She does have hx of diastolic dysfunction.  Now takes lasix 80 mg PO daily.  On that dose, swelling is at baseline.  She has her own pulse ox and states that her O2 sat drops below 88% quickly with activity.   DM  Has been out of Ozempic x 3 weeks because she is in the donut hole.  We have applied for med assist through the drug company. She is unsure if some of her unsteadiness is also lightheadedness in addition to the DOE.  On metoprolol and lasix, which are the only drugs that might effect blood pressure.  Has not been taking BP at home regularly.    OBJECTIVE:   BP 112/80   Pulse 80   Ht '4\' 11"'$  (1.499 m)   Wt 168 lb 3.2 oz (76.3 kg)   SpO2 94%   BMI 33.97 kg/m   Wt noted to be slightly lower than previous baseline.  Pulse ox at rest OK.  Did not bring walker with her so we cannot do pulse ox with activity. Lungs clear Cardiac RRR without m or g Ext trace edema bilaterally.   ASSESSMENT/PLAN:   DM (diabetes mellitus) with complications ([place]) Get her back on her ozempic - hopefully this will also help with wt loss.  She would be a good candidate for an SGLT2 inhibitor.  Consider that change next visit.  Heart failure with preserved ejection fraction (HCC), Grade 3 diastolic dysfunction Seems to be at dry wt.  Sounds like she is hypoxic with activity.  Likely needs home O2 to use at night and with activity.  She will get her PT to document O2 sats for qualifications.    Respiratory failure with hypoxia ([place]) Likely contributing to her DOE and hypoxia.  Home O2  PAF (paroxysmal atrial fibrillation) (HCC) Metoprolol fro rate control.  Check home BP to make sure we are not dropping diastolics too much.     Karen Resides, MD [place]

## 2021-05-11 NOTE — Progress Notes (Signed)
Received notification from [place] regarding approval for OZEMPIC '2MG'$ /1.5ML. Patient assistance approved UNTIL 07/27/21.  MEDICATION WILL SHIP TO [facility]  Phone: [phone]

## 2021-05-11 NOTE — Progress Notes (Signed)
Noted.  I appreciate pharmacology's efforts to help this patient.

## 2021-05-12 ENCOUNTER — Institutional Professional Consult (permissible substitution): Payer: Medicare Other | Admitting: Cardiology

## 2021-05-16 NOTE — Progress Notes (Signed)
Electrophysiology Office Note:    Date:  05/17/2021   ID:  KAMALI CARMOUCHE, DOB [birth year], MRN [id number]  PCP:  Zenia Resides, MD  [facility] Cardiologist:  None  [facility] Electrophysiologist:  Vickie Epley, MD   Referring MD: Oliver Barre, PA   Chief Complaint: Atrial fibrillation  History of Present Illness:    Karen Jackson is a 80 y.o. female who presents for an evaluation of atrial fibrillation at the request of Adline Peals, PA-C. Their medical history includes hypertension, persistent atrial fibrillation, prediabetes and stroke.  She was last seen by Adline Peals, PA-C on March 16, 2021.  Her diagnosis of atrial fibrillation dates back to 2018.  She has a history of a nontraumatic hemorrhagic stroke in 2013.  She is currently not anticoagulated.  She is with her son today in clinic.  She is not very active and she gets around with a wheelchair.  She is very interested in adding stroke protection and is concerned about her risk for stroke being off anticoagulation.   Past Medical History:  Diagnosis Date   Acute cystitis without hematuria    Acute diastolic CHF (congestive heart failure) (HCC)    Arthritis    Dyspnea    Dysrhythmia    Fever of unknown origin 03/19/2017   Hyperlipidemia    Hypertension    denies at preop   Multifocal pneumonia    Neuromuscular disorder (HCC)    neuropathy left arm and foot   Osteopenia    Paralysis (HCC)    partial left side from CVA    Persistent atrial fibrillation (HCC)    PONV (postoperative nausea and vomiting)    Pre-diabetes    Stroke [facility]) 2013   hemmorahgic    Past Surgical History:  Procedure Laterality Date   ANKLE SURGERY     APPENDECTOMY     CHOLECYSTECTOMY     HERNIA REPAIR     Esophagus   JOINT REPLACEMENT     total- right partial- left   MASTECTOMY PARTIAL / LUMPECTOMY  2012   left   ORIF ANKLE FRACTURE Left 07/20/2018   Procedure: OPEN REDUCTION INTERNAL FIXATION (ORIF) ANKLE FRACTURE;   Surgeon: Wylene Simmer, MD;  Location: [facility];  Service: Orthopedics;  Laterality: Left;   TOTAL KNEE ARTHROPLASTY Left 01/27/2019   Procedure: TOTAL KNEE ARTHROPLASTY;  Surgeon: Vickey Huger, MD;  [facility];  Service: Orthopedics;  Laterality: Left;    Current Medications: Current Meds  Medication Sig   albuterol (VENTOLIN HFA) 108 (90 Base) MCG/ACT inhaler Inhale 2 puffs into the lungs every 6 (six) hours as needed for wheezing or shortness of breath.   atorvastatin (LIPITOR) 20 MG tablet TAKE 1 TABLET BY MOUTH  DAILY   Calcium Carb-Cholecalciferol (CALCIUM CARBONATE-VITAMIN D3) 600-400 MG-UNIT TABS Take 1 tablet by mouth daily.   celecoxib (CELEBREX) 200 MG capsule TAKE 1 CAPSULE BY MOUTH  DAILY AS NEEDED FOR  ARTHRITIS PAIN   diclofenac Sodium (VOLTAREN) 1 % GEL APPLY 2 GRAMS EXTERNALLY TO THE AFFECTED AREA FOUR TIMES DAILY   ergocalciferol (VITAMIN D2) 1.25 MG (50000 UT) capsule 50,000 unit   fluocinonide (LIDEX) 0.05 % external solution APPLY 1 ML TOPICALLY TO THE SCALP TWICE DAILY   furosemide (LASIX) 80 MG tablet TAKE 1 TABLET BY MOUTH  DAILY   gabapentin (NEURONTIN) 100 MG capsule TAKE 1 CAPSULE BY MOUTH 3  TIMES DAILY   hydrocortisone 2.5 % cream Apply topically 2 (two) times daily as needed.  lidocaine (LIDODERM) 5 % Place 1 patch onto the skin daily. Remove & Discard patch within 12 hours or as directed by MD   MAGNESIUM-OXIDE 400 (241.3 Mg) MG tablet TAKE 1 TABLET BY MOUTH DAILY   metoprolol tartrate (LOPRESSOR) 25 MG tablet Take 1 tablet (25 mg total) by mouth 2 (two) times daily.   Multiple Vitamins-Minerals (HAIR/SKIN/NAILS) CAPS Take 3 capsules by mouth daily.   omeprazole (PRILOSEC) 20 MG capsule TAKE 1 CAPSULE BY MOUTH  DAILY   traZODone (DESYREL) 50 MG tablet TAKE 1 TABLET BY MOUTH AT  BEDTIME   TURMERIC PO Take 1 capsule by mouth daily.     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  Tobacco Use   Smoking status: Former    Packs/day: 0.50    Years: 23.00    Pack years: 11.50    Types: Cigarettes    Start date: 08/28/1957    Quit date: 03/09/1981    Years since quitting: 40.2   Smokeless tobacco: Never  Vaping Use   Vaping Use: Never used  Substance and Sexual Activity   Alcohol use: Yes    Alcohol/week: 16.0 standard drinks    Types: 2 Glasses of wine, 14 Standard drinks or equivalent per week    Comment: daily one glass   Drug use: No   Sexual activity: Not Currently  Other Topics Concern   Not on file  Social History Narrative   ** Merged History Encounter **       Health Care POA: son, Allen Kell   Emergency Contact: Lelon Frohlich and Roosvelt Harps, niece & nephew (c) [phone] (h) [phone]   End of Life Plan:    Who lives with you: self   Any pets: none   Diet: Pt has a varied diet of protein, vegetables and limits "white" foods.   Exercise: Pt does water aerobics 3x week, golf 2x week   Seatbelts: Pt reports wearing seatbelt when in vehicle.    Nancy Fetter Exposure/Protection: Pt reports wearing sun screen   Hobbies: golfing, swimming          Social Determinants of Health   Financial Resource Strain: Not on file  Food Insecurity: Not on file  Transportation Needs: Not on file  Physical Activity: Not on file  Stress: Not on file  Social Connections: Not on file     Family History: The patient's family history includes Diabetes in her mother.  ROS:   Please see the history of present illness.    All other systems reviewed and are negative.  EKGs/Labs/Other Studies Reviewed:    The following studies were reviewed today:  Prior notes   March 21, 2017 echo Left ventricular function normal, 60% No significant valvular abnormalities  March 16, 2021 EKG personally reviewed Atypical  atrial flutter   EKG:  The ekg ordered today demonstrates atrial fibrillation.  Recent Labs: 03/10/2021: ALT 13; B Natriuretic Peptide 144.7; BUN 14; Creatinine, Ser 0.64; Hemoglobin 14.4; Platelets 296; Potassium 4.0; Sodium 137  Recent Lipid Panel    Component Value Date/Time   CHOL 191 12/01/2019 1406   TRIG 162 (H) 12/01/2019 1406   HDL 62 12/01/2019 1406   CHOLHDL 3.1 12/01/2019 1406   CHOLHDL 2.5  06/17/2015 1057   VLDL 23 06/17/2015 1057   LDLCALC 101 (H) 12/01/2019 1406   LDLDIRECT 107 (H) 04/24/2014 1118    Physical Exam:    VS:  BP 116/66   Pulse 77   Ht 4' 10.5" (1.486 m)   Wt 170 lb (77.1 kg)   SpO2 90%   BMI 34.93 kg/m     Wt Readings from Last 3 Encounters:  05/17/21 170 lb (77.1 kg)  05/05/21 168 lb 3.2 oz (76.3 kg)  03/16/21 170 lb 12.8 oz (77.5 kg)     GEN: Well nourished, well developed in no acute distress.  In wheelchair HEENT: Normal NECK: No JVD; No carotid bruits LYMPHATICS: No lymphadenopathy CARDIAC: Irregularly irregular, no murmurs, rubs, gallops RESPIRATORY:  Clear to auscultation without rales, wheezing or rhonchi  ABDOMEN: Soft, non-tender, non-distended MUSCULOSKELETAL:  No edema; No deformity  SKIN: Warm and dry NEUROLOGIC:  Alert and oriented x 3 PSYCHIATRIC:  Normal affect   ASSESSMENT:    1. Persistent atrial fibrillation ([place])   2. Nontraumatic cerebral hemorrhage (HCC)    PLAN:    In order of problems listed above:  #Persistent atrial fibrillation Off anticoagulation.  She has a CHA2DS2-VASc of at least 8 (10.8% annual risk of stroke). She has not seen a neurologist in about 10 years so we will have her touch base with our neurology colleagues here to discuss anticoagulation after intracranial hemorrhage.  I suspect she may be a candidate for short-term use of NOAC but will have them weigh in about this.  Also get an echocardiogram and CT to assess her left atrial appendage anatomy before finalizing a decision about her  candidacy for left atrial appendage closure.  I will plan to see her back in about 8 weeks after the above is completed.      Total time spent with patient today 45 minutes. This includes reviewing records, evaluating the patient and coordinating care.  Medication Adjustments/Labs and Tests Ordered: Current medicines are reviewed at length with the patient today.  Concerns regarding medicines are outlined above.  No orders of the defined types were placed in this encounter.  No orders of the defined types were placed in this encounter.    Signed, Hilton Cork. Quentin Ore, MD, [facility], [facility] 05/17/2021 10:25 AM    Electrophysiology [facility]

## 2021-05-17 ENCOUNTER — Ambulatory Visit (INDEPENDENT_AMBULATORY_CARE_PROVIDER_SITE_OTHER): Payer: Medicare Other | Admitting: Cardiology

## 2021-05-17 ENCOUNTER — Other Ambulatory Visit: Payer: Self-pay

## 2021-05-17 ENCOUNTER — Institutional Professional Consult (permissible substitution): Payer: Medicare Other | Admitting: Cardiology

## 2021-05-17 VITALS — BP 116/66 | HR 77 | Ht 58.5 in | Wt 170.0 lb

## 2021-05-17 DIAGNOSIS — I619 Nontraumatic intracerebral hemorrhage, unspecified: Secondary | ICD-10-CM

## 2021-05-17 DIAGNOSIS — I4819 Other persistent atrial fibrillation: Secondary | ICD-10-CM

## 2021-05-17 LAB — BASIC METABOLIC PANEL
BUN/Creatinine Ratio: 27 (ref 12–28)
BUN: 16 mg/dL (ref 8–27)
CO2: 31 mmol/L — ABNORMAL HIGH (ref 20–29)
Calcium: 9.6 mg/dL (ref 8.7–10.3)
Chloride: 94 mmol/L — ABNORMAL LOW (ref 96–106)
Creatinine, Ser: 0.59 mg/dL (ref 0.57–1.00)
Glucose: 151 mg/dL — ABNORMAL HIGH (ref 65–99)
Potassium: 4.4 mmol/L (ref 3.5–5.2)
Sodium: 140 mmol/L (ref 134–144)
eGFR: 91 mL/min/{1.73_m2} (ref >59)

## 2021-05-17 NOTE — Patient Instructions (Addendum)
Medication Instructions:  Your physician recommends that you continue on your current medications as directed. Please refer to the Current Medication list given to you today. *If you need a refill on your cardiac medications before your next appointment, please call your pharmacy*  Lab Work: You will get lab work today:  BMP  If you have labs (blood work) drawn today and your tests are completely normal, you will receive your results only by: [place] (if you have MyChart) OR A paper copy in the mail If you have any lab test that is abnormal or we need to change your treatment, we will call you to review the results.  Testing/Procedures: Your physician has requested that you have an echocardiogram. Echocardiography is a painless test that uses sound waves to create images of your heart. It provides your doctor with information about the size and shape of your heart and how well your heart's chambers and valves are working. This procedure takes approximately one hour. There are no restrictions for this procedure.  Please schedule for ECHO  Your physician has requested that you have cardiac CT. Cardiac computed tomography (CT) is a painless test that uses an x-ray machine to take clear, detailed pictures of your heart.   You will get a phone call to schedule your cardiac CT     Your next appointment:    Your physician wants you to follow-up in: 8 weeks with Dr. Quentin Ore.

## 2021-05-23 ENCOUNTER — Telehealth (HOSPITAL_COMMUNITY): Payer: Self-pay | Admitting: Emergency Medicine

## 2021-05-23 ENCOUNTER — Other Ambulatory Visit: Payer: Self-pay

## 2021-05-23 ENCOUNTER — Ambulatory Visit (INDEPENDENT_AMBULATORY_CARE_PROVIDER_SITE_OTHER): Payer: Medicare Other | Admitting: Podiatry

## 2021-05-23 ENCOUNTER — Encounter: Payer: Self-pay | Admitting: Podiatry

## 2021-05-23 DIAGNOSIS — L603 Nail dystrophy: Secondary | ICD-10-CM

## 2021-05-23 DIAGNOSIS — M79609 Pain in unspecified limb: Secondary | ICD-10-CM

## 2021-05-23 DIAGNOSIS — B351 Tinea unguium: Secondary | ICD-10-CM

## 2021-05-23 DIAGNOSIS — E118 Type 2 diabetes mellitus with unspecified complications: Secondary | ICD-10-CM | POA: Diagnosis not present

## 2021-05-23 NOTE — Telephone Encounter (Signed)
Attempted to call patient regarding upcoming cardiac CT appointment. °Left message on voicemail with name and callback number °  RN Navigator Cardiac Imaging °[facility] °[phone] Office °[phone] Cell ° °

## 2021-05-23 NOTE — Telephone Encounter (Signed)
Reaching out to patient to offer assistance regarding upcoming cardiac imaging study; pt verbalizes understanding of appt date/time, parking situation and where to check in, pre-test NPO status and medications ordered, and verified current allergies; name and call back number provided for further questions should they arise Marchia Bond RN Navigator Cardiac Imaging [facility] [phone] office [phone] cell  25mg  metoprolol BID new rx 8/15 Holding lasix Denies iv issues WC bound

## 2021-05-23 NOTE — Progress Notes (Signed)
This patient returns to my office for at risk foot care.  This patient requires this care by a professional since this patient will be at risk due to having diabetes.    This patient is unable to cut nails herself since the patient cannot reach her nails.These nails are painful walking and wearing shoes.  This patient presents for at risk foot care today.  General Appearance  Alert, conversant and in no acute stress.  Vascular  Dorsalis pedis and posterior tibial  pulses are palpable  bilaterally.  Capillary return is within normal limits  bilaterally. Temperature is within normal limits  bilaterally.  Neurologic  Senn-Weinstein monofilament wire test within normal limits  bilaterally. Muscle power within normal limits bilaterally.  Nails Thick disfigured discolored nails with subungual debris  from hallux to fifth toes bilaterally. No evidence of bacterial infection or drainage bilaterally.  Orthopedic  No limitations of motion  feet .  No crepitus or effusions noted.  No bony pathology or digital deformities noted.  Skin  normotropic skin with no porokeratosis noted bilaterally.  No signs of infections or ulcers noted.     Onychomycosis  Pain in right toes  Pain in left toes  Consent was obtained for treatment procedures.   Mechanical debridement of nails 1-5  bilaterally performed with a nail nipper.  Filed with dremel without incident.    Return office visit    3 months                  Told patient to return for periodic foot care and evaluation due to potential at risk complications.     DPM   

## 2021-05-24 ENCOUNTER — Encounter: Payer: Self-pay | Admitting: Diagnostic Neuroimaging

## 2021-05-24 ENCOUNTER — Ambulatory Visit (INDEPENDENT_AMBULATORY_CARE_PROVIDER_SITE_OTHER): Payer: Medicare Other | Admitting: Diagnostic Neuroimaging

## 2021-05-24 VITALS — BP 108/65 | HR 70 | Ht 59.0 in

## 2021-05-24 DIAGNOSIS — I619 Nontraumatic intracerebral hemorrhage, unspecified: Secondary | ICD-10-CM

## 2021-05-24 DIAGNOSIS — E118 Type 2 diabetes mellitus with unspecified complications: Secondary | ICD-10-CM

## 2021-05-24 DIAGNOSIS — I69354 Hemiplegia and hemiparesis following cerebral infarction affecting left non-dominant side: Secondary | ICD-10-CM

## 2021-05-24 NOTE — Progress Notes (Signed)
[facility]  PATIENT: Karen Jackson  DOB: [birth year]  REFERRING CLINICIAN: Vickie Epley, MD HISTORY FROM: patient  REASON FOR VISIT: new consult    HISTORICAL  CHIEF COMPLAINT:  Chief Complaint  Patient presents with   Cerebrovascular Accident    RM 6 with caregiver Shirlean Mylar Pt is well, had her last stroke in 2013 that she is aware of. Getting placement for AFIB soon. Overall doing well    HISTORY OF PRESENT ILLNESS:   80 year old female here for evaluation of stroke and atrial fibrillation.  2013 patient had sudden onset left-sided weakness and was diagnosed with right basal ganglia intracerebral hemorrhage.  History of hypertension, hyperlipidemia, diabetes, congestive heart failure.  Around 2018 she was diagnosed with atrial fibrillation.  However due to her history of hemorrhagic stroke she was not started on anticoagulation.  Now patient is being evaluated by cardiology for consideration of anticoagulation versus left atrial appendage closure device.   REVIEW OF SYSTEMS: Full 14 system review of systems performed and negative with exception of: as per HPI.  ALLERGIES: Allergies  Allergen Reactions   Codeine Phosphate Nausea And Vomiting   Simvastatin Other (See Comments)    Myalgia  Changed brand ok now    HOME MEDICATIONS: Outpatient Medications Prior to Visit  Medication Sig Dispense Refill   albuterol (VENTOLIN HFA) 108 (90 Base) MCG/ACT inhaler Inhale 2 puffs into the lungs every 6 (six) hours as needed for wheezing or shortness of breath. 18 g 2   atorvastatin (LIPITOR) 20 MG tablet TAKE 1 TABLET BY MOUTH  DAILY 90 tablet 3   Calcium Carb-Cholecalciferol (CALCIUM CARBONATE-VITAMIN D3) 600-400 MG-UNIT TABS Take 1 tablet by mouth daily.     celecoxib (CELEBREX) 200 MG capsule TAKE 1 CAPSULE BY MOUTH  DAILY AS NEEDED FOR  ARTHRITIS PAIN 90 capsule 3   diclofenac Sodium (VOLTAREN) 1 % GEL APPLY 2 GRAMS EXTERNALLY TO THE AFFECTED AREA FOUR  TIMES DAILY     ergocalciferol (VITAMIN D2) 1.25 MG (50000 UT) capsule 50,000 unit     fluocinonide (LIDEX) 0.05 % external solution APPLY 1 ML TOPICALLY TO THE SCALP TWICE DAILY     furosemide (LASIX) 80 MG tablet TAKE 1 TABLET BY MOUTH  DAILY 90 tablet 3   gabapentin (NEURONTIN) 100 MG capsule TAKE 1 CAPSULE BY MOUTH 3  TIMES DAILY 270 capsule 3   hydrocortisone 2.5 % cream Apply topically 2 (two) times daily as needed.     lidocaine (LIDODERM) 5 % Place 1 patch onto the skin daily. Remove & Discard patch within 12 hours or as directed by MD 30 patch 2   MAGNESIUM-OXIDE 400 (241.3 Mg) MG tablet TAKE 1 TABLET BY MOUTH DAILY 90 tablet 3   metoprolol tartrate (LOPRESSOR) 25 MG tablet Take 1 tablet (25 mg total) by mouth 2 (two) times daily. 180 tablet 3   Multiple Vitamins-Minerals (HAIR/SKIN/NAILS) CAPS Take 3 capsules by mouth daily.     omeprazole (PRILOSEC) 20 MG capsule TAKE 1 CAPSULE BY MOUTH  DAILY 90 capsule 3   traZODone (DESYREL) 50 MG tablet TAKE 1 TABLET BY MOUTH AT  BEDTIME 90 tablet 3   TURMERIC PO Take 1 capsule by mouth daily.     No facility-administered medications prior to visit.    PAST MEDICAL HISTORY: Past Medical History:  Diagnosis Date   Acute cystitis without hematuria    Acute diastolic CHF (congestive heart failure) (HCC)    Arthritis    Dyspnea    Dysrhythmia  Fever of unknown origin 03/19/2017   Hyperlipidemia    Hypertension    denies at preop   Multifocal pneumonia    Neuromuscular disorder (HCC)    neuropathy left arm and foot   Osteopenia    Paralysis (HCC)    partial left side from CVA    Persistent atrial fibrillation (HCC)    PONV (postoperative nausea and vomiting)    Pre-diabetes    Stroke [facility]) 2013   hemmorahgic    PAST SURGICAL HISTORY: Past Surgical History:  Procedure Laterality Date   ANKLE SURGERY     APPENDECTOMY     CHOLECYSTECTOMY     HERNIA REPAIR     Esophagus   JOINT REPLACEMENT     total- right partial- left    MASTECTOMY PARTIAL / LUMPECTOMY  2012   left   ORIF ANKLE FRACTURE Left 07/20/2018   Procedure: OPEN REDUCTION INTERNAL FIXATION (ORIF) ANKLE FRACTURE;  Surgeon: Wylene Simmer, MD;  Location: [place];  Service: Orthopedics;  Laterality: Left;   TOTAL KNEE ARTHROPLASTY Left 01/27/2019   Procedure: TOTAL KNEE ARTHROPLASTY;  Surgeon: Vickey Huger, MD;  Location[facility];  Service: Orthopedics;  Laterality: Left;    FAMILY HISTORY: Family History  Problem Relation Age of Onset   Diabetes Mother    Transient ischemic attack Father    Dementia Father     SOCIAL HISTORY: Social History   Socioeconomic History   Marital status: Widowed    Spouse name: Not on file   Number of children: 2   Years of education: college   Highest education level: Not on file  Occupational History   Occupation: RetiredGames developer  Tobacco Use   Smoking status: Former    Packs/day: 0.50    Years: 23.00    Pack years: 11.50    Types: Cigarettes    Start date: 08/28/1957    Quit date: 03/09/1981    Years since quitting: 40.2   Smokeless tobacco: Never  Vaping Use   Vaping Use: Never used  Substance and Sexual Activity   Alcohol use: Yes    Alcohol/week: 16.0 standard drinks    Types: 2 Glasses of wine, 14 Standard drinks or equivalent per week    Comment: daily one glass   Drug use: No   Sexual activity: Not Currently  Other Topics Concern   Not on file  Social History Narrative   ** Merged History Encounter ** Health Care POA: son, Charly RayEmergency Contact: Ann and Roosvelt Harps, niece & nephew (c) [phone] (h) [phone] of Life Plan: Who lives with you: selfAny pets: noneDiet: Pt has a varied diet of protein, vegetables and limits "white" foods.Exercise: Pt does water aerobics 3x week, golf 2x weekSeatbelts: Pt reports wearing seatbelt when in vehicle. Sun Exposure/Protection: Pt reports wearing sun screenHobbies: golfing, swimming    Lives alone (has caregivers coming in)   R handed    Social  Determinants of Health   Financial Resource Strain: Not on file  Food Insecurity: Not on file  Transportation Needs: Not on file  Physical Activity: Not on file  Stress: Not on file  Social Connections: Not on file  Intimate Partner Violence: Not on file     PHYSICAL EXAM  GENERAL EXAM/CONSTITUTIONAL: Vitals:  Vitals:   05/24/21 1410  BP: 108/65  Pulse: 70  Height: 4\' 11"  (1.499 m)   Body mass index is 34.34 kg/m. Wt Readings from Last 3 Encounters:  05/17/21 170 lb (77.1 kg)  05/05/21 168 lb 3.2 oz (  76.3 kg)  03/16/21 170 lb 12.8 oz (77.5 kg)   Patient is in no distress; well developed, nourished and groomed; neck is supple  CARDIOVASCULAR: Examination of carotid arteries is normal; no carotid bruits IRREG RATE AND RHYTHM Examination of peripheral vascular system by observation and palpation is normal  EYES: Ophthalmoscopic exam of optic discs and posterior segments is normal; no papilledema or hemorrhages No results found.  MUSCULOSKELETAL: Gait, strength, tone, movements noted in Neurologic exam below  NEUROLOGIC: MENTAL STATUS:  MMSE - Mini Mental State Exam 06/05/2011  Orientation to time 5  Orientation to Place 5  Registration 3  Attention/ Calculation 5  Recall 3  Language- name 2 objects 2  Language- repeat 1  Language- follow 3 step command 3  Language- read & follow direction 1  Write a sentence 1  Copy design 0  Total score 29   awake, alert, oriented to person, place and time recent and remote memory intact normal attention and concentration language fluent, comprehension intact, naming intact fund of knowledge appropriate  CRANIAL NERVE:  2nd - no papilledema on fundoscopic exam 2nd, 3rd, 4th, 6th - pupils equal and reactive to light, visual fields full to confrontation, extraocular muscles intact, no nystagmus 5th - facial sensation symmetric 7th - facial strength --> DECR LEFT LOWER FACIAL STRENGTH 8th - hearing intact 9th - palate  elevates symmetrically, uvula midline 11th - shoulder shrug symmetric 12th - tongue protrusion midline  MOTOR:  normal bulk and tone; RUE, RLE 5; LUE 3-4; LLE 3-4  SENSORY:  DECR IN LUE AND LLE  COORDINATION:  finger-nose-finger, fine finger movements SLOW ON LEFT  REFLEXES:  deep tendon reflexes TRACE and symmetric  GAIT/STATION:  IN WHEEL CHAIR; USES WALKER AT HOME     DIAGNOSTIC DATA (LABS, IMAGING, TESTING) - I reviewed patient records, labs, notes, testing and imaging myself where available.  Lab Results  Component Value Date   WBC 6.5 03/10/2021   HGB 14.4 03/10/2021   HCT 42.7 03/10/2021   MCV 95.3 03/10/2021   PLT 296 03/10/2021      Component Value Date/Time   NA 140 05/17/2021 1107   K 4.4 05/17/2021 1107   CL 94 (L) 05/17/2021 1107   CO2 31 (H) 05/17/2021 1107   GLUCOSE 151 (H) 05/17/2021 1107   GLUCOSE 136 (H) 03/10/2021 1244   BUN 16 05/17/2021 1107   CREATININE 0.59 05/17/2021 1107   CREATININE 0.64 03/14/2016 1020   CALCIUM 9.6 05/17/2021 1107   CALCIUM 10.7 (H) 03/04/2010 0000   PROT 6.7 03/10/2021 1244   PROT 6.6 05/23/2018 1341   ALBUMIN 4.1 03/10/2021 1244   ALBUMIN 4.2 05/23/2018 1341   AST 20 03/10/2021 1244   ALT 13 03/10/2021 1244   ALKPHOS 69 03/10/2021 1244   BILITOT 0.6 03/10/2021 1244   BILITOT 0.5 05/23/2018 1341   GFRNONAA >60 03/10/2021 1244   GFRNONAA 88 03/14/2016 1020   GFRAA >60 01/28/2019 0435   GFRAA >89 03/14/2016 1020   Lab Results  Component Value Date   CHOL 191 12/01/2019   HDL 62 12/01/2019   LDLCALC 101 (H) 12/01/2019   LDLDIRECT 107 (H) 04/24/2014   TRIG 162 (H) 12/01/2019   CHOLHDL 3.1 12/01/2019   Lab Results  Component Value Date   HGBA1C 6.3 05/05/2021   Lab Results  Component Value Date   DGUYQIHK74 259 12/01/2019   Lab Results  Component Value Date   TSH 0.902 05/23/2018    02/22/12 CT head  - 2-cm intracerebral  hemorrhage in the right basal ganglia with no significant mass effect at  this time.  03/21/17 MRI brain [I reviewed images myself and agree with interpretation. No evidence of cerebral amyloid angiopathy. -VRP]  1. No acute intracranial abnormality. 2. Remote hemorrhagic stroke involving the posterior limb of the right internal capsule and lateral thalamus. 3. Moderate atrophy and diffuse white matter disease is advanced for age.  03/10/21 CT head [I reviewed images myself and agree with interpretation. -VRP]  - Atrophy and chronic microvascular ischemic change. No acute abnormality    ASSESSMENT AND PLAN  80 y.o. year old female here with:   Dx:  1. Hemiparesis affecting left side as late effect of cerebrovascular accident ([place])   2. Nontraumatic intracerebral hemorrhage of basal ganglia (HCC)   3. DM (diabetes mellitus) with complications ([place])       PLAN:  HISTORY OF 2cm RIGHT BASAL GANGLIA INTRACEREBRAL HEMORRHAGE (2013) - due to atrial fibrillation, may consider anti-coagulation long term for stroke prevention, but there may be moderate or intermediate risk based on HAS-BLED calculator score; if patient prefers to avoid anticoagulation due to fall risk or other preference, then may consider alternate treatments (left atrial appendage closure / watchman device)   CHA2DS2-VASc Score = 8  This indicates a 10.8% annual risk of stroke. The patient's score is based upon: CHF History: 1 HTN History: 1 Diabetes History: 1 Stroke History: 2 Vascular Disease History: 0 Age Score: 2 Gender Score: 1    HAS-BLED = 2 points (1 for stroke, 1 for prior bleeding) Risk was 4.1% in one validation study (Lip 2011) and 1.88 bleeds per 100 patient-years in another validation study (Pisters 2010).  Anticoagulation can be considered, however patient does have moderate risk for major bleeding (~2/100 patient-years).   Return for pending if symptoms worsen or fail to improve, return to PCP.    Penni Bombard, MD 1/61/0960, 4:54 AM Certified in  Neurology, Neurophysiology and Neuroimaging  [facility] [address], [place], [place] [address] [phone]

## 2021-05-25 ENCOUNTER — Other Ambulatory Visit: Payer: Self-pay

## 2021-05-25 ENCOUNTER — Ambulatory Visit (HOSPITAL_COMMUNITY)
Admission: RE | Admit: 2021-05-25 | Discharge: 2021-05-25 | Disposition: A | Discharge status: Home / Self Care | Payer: Medicare Other | Source: Ambulatory Visit | Attending: Cardiology | Admitting: Cardiology

## 2021-05-25 DIAGNOSIS — I4819 Other persistent atrial fibrillation: Secondary | ICD-10-CM | POA: Insufficient documentation

## 2021-05-25 DIAGNOSIS — I619 Nontraumatic intracerebral hemorrhage, unspecified: Secondary | ICD-10-CM | POA: Diagnosis not present

## 2021-05-25 MED ORDER — IOHEXOL 350 MG/ML SOLN
100.0000 mL | Freq: Once | INTRAVENOUS | Status: AC
  Administered 2021-05-25: 80 mL via INTRAVENOUS

## 2021-05-25 MED ORDER — METOPROLOL TARTRATE 5 MG/5ML IV SOLN
INTRAVENOUS | Status: AC
  Filled 2021-05-25: qty 10

## 2021-05-25 MED ORDER — METOPROLOL TARTRATE 5 MG/5ML IV SOLN
10.0000 mg | Freq: Once | INTRAVENOUS | Status: AC
  Administered 2021-05-25: 10 mg via INTRAVENOUS

## 2021-05-27 ENCOUNTER — Ambulatory Visit: Payer: Medicare Other | Admitting: Podiatry

## 2021-05-31 ENCOUNTER — Ambulatory Visit: Payer: Medicare Other | Admitting: Podiatry

## 2021-06-02 ENCOUNTER — Telehealth: Payer: Self-pay

## 2021-06-02 NOTE — Telephone Encounter (Signed)
Spoke with pt regarding pickup of ozempic medication.   5 boxes are labeled & ready in med room fridge.

## 2021-06-03 ENCOUNTER — Other Ambulatory Visit: Payer: Self-pay

## 2021-06-03 ENCOUNTER — Ambulatory Visit (HOSPITAL_COMMUNITY): Payer: Medicare Other | Attending: Cardiology

## 2021-06-03 DIAGNOSIS — I4819 Other persistent atrial fibrillation: Secondary | ICD-10-CM | POA: Diagnosis not present

## 2021-06-03 DIAGNOSIS — I619 Nontraumatic intracerebral hemorrhage, unspecified: Secondary | ICD-10-CM | POA: Diagnosis not present

## 2021-06-03 LAB — ECHOCARDIOGRAM COMPLETE: S' Lateral: 3.3 cm

## 2021-06-03 NOTE — Telephone Encounter (Signed)
Pt's caregiver presented to pickup PAP medicine

## 2021-06-09 DIAGNOSIS — H6123 Impacted cerumen, bilateral: Secondary | ICD-10-CM | POA: Diagnosis not present

## 2021-06-23 DIAGNOSIS — Z23 Encounter for immunization: Secondary | ICD-10-CM | POA: Diagnosis not present

## 2021-07-04 NOTE — Progress Notes (Signed)
Electrophysiology Office Follow up Visit Note:    Date:  07/05/2021   ID:  Karen Jackson, DOB [birth year], MRN [id number]  PCP:  Zenia Resides, MD  [facility] Cardiologist:  None  [facility] Electrophysiologist:  Vickie Epley, MD    Interval History:    Karen Jackson is a 80 y.o. female who presents for a follow up visit. They were last seen in clinic May 17, 2021 for paroxysmal atrial fibrillation.  In 2013 she had a nontraumatic hemorrhagic stroke.  When I saw her she was not anticoagulated.  Today she is with medical assistant in clinic.  She tells me she has been feeling short of breath.  She is using her wheelchair to help with mobility given shortness of breath with minimal exertion.  Since I last saw her she had a CT scan of the chest which demonstrated windsock anatomy left atrial appendage.  She also saw neurology.  The consensus from neurology was that medical therapy for stroke risk reduction was likely feasible for her if the patient was willing to take the medication.     Past Medical History:  Diagnosis Date   Acute cystitis without hematuria    Acute diastolic CHF (congestive heart failure) (HCC)    Arthritis    Dyspnea    Dysrhythmia    Fever of unknown origin 03/19/2017   Hyperlipidemia    Hypertension    denies at preop   Multifocal pneumonia    Neuromuscular disorder (HCC)    neuropathy left arm and foot   Osteopenia    Paralysis (HCC)    partial left side from CVA    Persistent atrial fibrillation (HCC)    PONV (postoperative nausea and vomiting)    Pre-diabetes    Stroke [facility]) 2013   hemmorahgic    Past Surgical History:  Procedure Laterality Date   ANKLE SURGERY     APPENDECTOMY     CHOLECYSTECTOMY     HERNIA REPAIR     Esophagus   JOINT REPLACEMENT     total- right partial- left   MASTECTOMY PARTIAL / LUMPECTOMY  2012   left   ORIF ANKLE FRACTURE Left 07/20/2018   Procedure: OPEN REDUCTION INTERNAL FIXATION (ORIF) ANKLE  FRACTURE;  Surgeon: Wylene Simmer, MD;  Location: [place];  Service: Orthopedics;  Laterality: Left;   TOTAL KNEE ARTHROPLASTY Left 01/27/2019   Procedure: TOTAL KNEE ARTHROPLASTY;  Surgeon: Vickey Huger, MD;  [facility];  Service: Orthopedics;  Laterality: Left;    Current Medications: Current Meds  Medication Sig   albuterol (VENTOLIN HFA) 108 (90 Base) MCG/ACT inhaler Inhale 2 puffs into the lungs every 6 (six) hours as needed for wheezing or shortness of breath.   apixaban (ELIQUIS) 5 MG TABS tablet Take 1 tablet (5 mg total) by mouth 2 (two) times daily.   atorvastatin (LIPITOR) 20 MG tablet TAKE 1 TABLET BY MOUTH  DAILY   Calcium Carb-Cholecalciferol (CALCIUM CARBONATE-VITAMIN D3) 600-400 MG-UNIT TABS Take 1 tablet by mouth daily.   celecoxib (CELEBREX) 200 MG capsule TAKE 1 CAPSULE BY MOUTH  DAILY AS NEEDED FOR  ARTHRITIS PAIN   diclofenac Sodium (VOLTAREN) 1 % GEL APPLY 2 GRAMS EXTERNALLY TO THE AFFECTED AREA FOUR TIMES DAILY   ergocalciferol (VITAMIN D2) 1.25 MG (50000 UT) capsule 50,000 unit   fluocinonide (LIDEX) 0.05 % external solution APPLY 1 ML TOPICALLY TO THE SCALP TWICE DAILY   furosemide (LASIX) 80 MG tablet TAKE 1 TABLET BY MOUTH  DAILY  gabapentin (NEURONTIN) 100 MG capsule TAKE 1 CAPSULE BY MOUTH 3  TIMES DAILY   hydrocortisone 2.5 % cream Apply topically 2 (two) times daily as needed.   lidocaine (LIDODERM) 5 % Place 1 patch onto the skin daily. Remove & Discard patch within 12 hours or as directed by MD   MAGNESIUM-OXIDE 400 (240 Mg) MG tablet Take 1 tablet by mouth daily.   MAGNESIUM-OXIDE 400 (241.3 Mg) MG tablet TAKE 1 TABLET BY MOUTH DAILY   metoprolol tartrate (LOPRESSOR) 25 MG tablet Take 1 tablet (25 mg total) by mouth 2 (two) times daily.   Multiple Vitamins-Minerals (HAIR/SKIN/NAILS) CAPS Take 3 capsules by mouth daily.   omeprazole (PRILOSEC) 20 MG capsule TAKE 1 CAPSULE BY MOUTH  DAILY   traZODone (DESYREL) 50 MG tablet TAKE 1 TABLET BY MOUTH AT  BEDTIME    TURMERIC PO Take 1 capsule by mouth daily.     Allergies:   Codeine phosphate and Simvastatin   Social History   Socioeconomic History   Marital status: Widowed    Spouse name: Not on file   Number of children: 2   Years of education: college   Highest education level: Not on file  Occupational History   Occupation: RetiredGames developer  Tobacco Use   Smoking status: Former    Packs/day: 0.50    Years: 23.00    Pack years: 11.50    Types: Cigarettes    Start date: 08/28/1957    Quit date: 03/09/1981    Years since quitting: 40.3   Smokeless tobacco: Never  Vaping Use   Vaping Use: Never used  Substance and Sexual Activity   Alcohol use: Yes    Alcohol/week: 16.0 standard drinks    Types: 2 Glasses of wine, 14 Standard drinks or equivalent per week    Comment: daily one glass   Drug use: No   Sexual activity: Not Currently  Other Topics Concern   Not on file  Social History Narrative   ** Merged History Encounter ** Health Care POA: son, Charly RayEmergency Contact: Ann and Roosvelt Harps, niece & nephew (c) [phone] (h) [phone] of Life Plan: Who lives with you: selfAny pets: noneDiet: Pt has a varied diet of protein, vegetables and limits "white" foods.Exercise: Pt does water aerobics 3x week, golf 2x weekSeatbelts: Pt reports wearing seatbelt when in vehicle. Sun Exposure/Protection: Pt reports wearing sun screenHobbies: golfing, swimming    Lives alone (has caregivers coming in)   R handed    Social Determinants of Health   Financial Resource Strain: Not on file  Food Insecurity: Not on file  Transportation Needs: Not on file  Physical Activity: Not on file  Stress: Not on file  Social Connections: Not on file     Family History: The patient's family history includes Dementia in her father; Diabetes in her mother; Transient ischemic attack in her father.  ROS:   Please see the history of present illness.    All other systems reviewed and are  negative.  EKGs/Labs/Other Studies Reviewed:    The following studies were reviewed today:   EKG:  The ekg ordered today demonstrates atrial fibrillation with a ventricular rate of 81 bpm  Recent Labs: 03/10/2021: ALT 13; B Natriuretic Peptide 144.7; Hemoglobin 14.4; Platelets 296 05/17/2021: BUN 16; Creatinine, Ser 0.59; Potassium 4.4; Sodium 140  Recent Lipid Panel    Component Value Date/Time   CHOL 191 12/01/2019 1406   TRIG 162 (H) 12/01/2019 1406   HDL 62 12/01/2019 1406  CHOLHDL 3.1 12/01/2019 1406   CHOLHDL 2.5 06/17/2015 1057   VLDL 23 06/17/2015 1057   LDLCALC 101 (H) 12/01/2019 1406   LDLDIRECT 107 (H) 04/24/2014 1118    Physical Exam:    VS:  BP 130/74   Pulse 81   Ht 4\' 11"  (1.499 m)   Wt 171 lb (77.6 kg)   SpO2 95%   BMI 34.54 kg/m     Wt Readings from Last 3 Encounters:  07/05/21 171 lb (77.6 kg)  05/17/21 170 lb (77.1 kg)  05/05/21 168 lb 3.2 oz (76.3 kg)     GEN: Chronically ill-appearing, wheelchair, some shortness of breath during our conversation  HEENT: Normal NECK: No JVD; No carotid bruits LYMPHATICS: No lymphadenopathy CARDIAC: Irregularly irregular, no murmurs, rubs, gallops RESPIRATORY:  Clear to auscultation without rales, wheezing or rhonchi  ABDOMEN: Soft, non-tender, non-distended MUSCULOSKELETAL: 1+ pitting bilateral lower extremity edema; No deformity  SKIN: Warm and dry NEUROLOGIC:  Alert and oriented x 3 PSYCHIATRIC:  Normal affect        ASSESSMENT:    1. PAF (paroxysmal atrial fibrillation) ([place])   2. Nontraumatic cerebral hemorrhage ([place])   3. DM (diabetes mellitus) with complications ([place])   4. Chronic heart failure with preserved ejection fraction (HCC)    PLAN:    In order of problems listed above:  #Persistent atrial fibrillation Rate controlled.  CHA2DS2-VASc of 8.  I did discuss her case with neurology including Dr. Leonie Man who have recommended rechallenge with anticoagulation.  I did discuss anticoagulation  during today's appointment including the risk for stroke not taking anticoagulation versus the risk of rebleeding on anticoagulation.  She is willing to retry anticoagulation after our discussion.  At this point, I do not think she is a suitable candidate for the watchman implant.  I am concerned about her chronic diastolic heart failure and shortness of breath and declining exercise capacity.  #Chronic diastolic heart failure NYHA class III today.  Warm and wet on exam.  I will increase her Lasix to twice daily for 3 days.  She will follow-up with an APP in the next 1 to 2 weeks.  We will get her established with general cardiology.  #Diabetes    Medication Adjustments/Labs and Tests Ordered: Current medicines are reviewed at length with the patient today.  Concerns regarding medicines are outlined above.  Orders Placed This Encounter  Procedures   Ambulatory referral to Cardiology   EKG 12-Lead   Meds ordered this encounter  Medications   apixaban (ELIQUIS) 5 MG TABS tablet    Sig: Take 1 tablet (5 mg total) by mouth 2 (two) times daily.    Dispense:  60 tablet    Refill:  11     Signed, Lars Mage, MD, [facility], [facility] 07/05/2021 10:58 AM    Electrophysiology [facility]

## 2021-07-05 ENCOUNTER — Ambulatory Visit (INDEPENDENT_AMBULATORY_CARE_PROVIDER_SITE_OTHER): Payer: Medicare Other | Admitting: Cardiology

## 2021-07-05 ENCOUNTER — Encounter: Payer: Self-pay | Admitting: Cardiology

## 2021-07-05 ENCOUNTER — Other Ambulatory Visit: Payer: Self-pay

## 2021-07-05 VITALS — BP 130/74 | HR 81 | Ht 59.0 in | Wt 171.0 lb

## 2021-07-05 DIAGNOSIS — I480 Paroxysmal atrial fibrillation: Secondary | ICD-10-CM

## 2021-07-05 DIAGNOSIS — I619 Nontraumatic intracerebral hemorrhage, unspecified: Secondary | ICD-10-CM | POA: Diagnosis not present

## 2021-07-05 DIAGNOSIS — I5032 Chronic diastolic (congestive) heart failure: Secondary | ICD-10-CM | POA: Diagnosis not present

## 2021-07-05 DIAGNOSIS — E118 Type 2 diabetes mellitus with unspecified complications: Secondary | ICD-10-CM

## 2021-07-05 MED ORDER — APIXABAN 5 MG PO TABS: 5.0000 mg | ORAL_TABLET | Freq: Two times a day (BID) | ORAL | 11 refills | Status: AC

## 2021-07-05 NOTE — Patient Instructions (Addendum)
Medication Instructions:  Your physician has recommended you make the following change in your medication:    INCREASE your furosemide (Lasix) 80 mg-  Take one tablet by mouth TWICE a day for 3 days only.  Then return to ONCE a day.  2.   START taking Eliquis 5 mg-  Take one tablet by mouth twice a day.  Lab Work: None ordered. If you have labs (blood work) drawn today and your tests are completely normal, you will receive your results only by: [place] (if you have MyChart) OR A paper copy in the mail If you have any lab test that is abnormal or we need to change your treatment, we will call you to review the results.  Testing/Procedures: None ordered.  Follow-Up: At [facility], you and your health needs are our priority.  As part of our continuing mission to provide you with exceptional heart care, we have created designated Provider Care Teams.  These Care Teams include your primary Cardiologist (physician) and Advanced Practice Providers (APPs -  Physician Assistants and Nurse Practitioners) who all work together to provide you with the care you need, when you need it.  Your next appointment:   Your physician wants you to follow-up in: 2 weeks to establish with general cardiology.  Apixaban Tablets What is this medication? APIXABAN (a PIX a ban) prevents or treats blood clots. It is also used to lower the risk of stroke in people with AFib (atrial fibrillation). It belongs to a group of medications called blood thinners. This medicine may be used for other purposes; ask your health care provider or pharmacist if you have questions. COMMON BRAND NAME(S): Eliquis What should I tell my care team before I take this medication? They need to know if you have any of these conditions: Antiphospholipid antibody syndrome Bleeding disorder History of bleeding in the brain History of blood clots History of stomach bleeding Kidney disease Liver disease Mechanical heart  valve Spinal surgery An unusual or allergic reaction to apixaban, other medications, foods, dyes, or preservatives Pregnant or trying to get pregnant Breast-feeding How should I use this medication? Take this medication by mouth. For your therapy to work as well as possible, take each dose exactly as prescribed on the prescription label. Do not skip doses. Skipping doses or stopping this medication can increase your risk of a blood clot or stroke. Keep taking this medication unless your care team tells you to stop. Take it as directed on the prescription label at the same time every day. You can take it with or without food. If it upsets your stomach, take it with food. A special MedGuide will be given to you by the pharmacist with each prescription and refill. Be sure to read this information carefully each time. Talk to your care team about the use of this medication in children. Special care may be needed. Overdosage: If you think you have taken too much of this medicine contact a poison control center or emergency room at once. NOTE: This medicine is only for you. Do not share this medicine with others. What if I miss a dose? If you miss a dose, take it as soon as you can. If it is almost time for your next dose, take only that dose. Do not take double or extra doses. What may interact with this medication? This medication may interact with the following: Aspirin and aspirin-like medications Certain medications for fungal infections like itraconazole and ketoconazole Certain medications for seizures like carbamazepine  and phenytoin Certain medications for blood clots like enoxaparin, dalteparin, heparin, and warfarin Clarithromycin NSAIDs, medications for pain and inflammation, like ibuprofen or naproxen Rifampin Ritonavir St. John's wort This list may not describe all possible interactions. Give your health care provider a list of all the medicines, herbs, non-prescription drugs, or  dietary supplements you use. Also tell them if you smoke, drink alcohol, or use illegal drugs. Some items may interact with your medicine. What should I watch for while using this medication? Visit your healthcare professional for regular checks on your progress. You may need blood work done while you are taking this medication. Your condition will be monitored carefully while you are receiving this medication. It is important not to miss any appointments. Avoid sports and activities that might cause injury while you are using this medication. Severe falls or injuries can cause unseen bleeding. Be careful when using sharp tools or knives. Consider using an Copy. Take special care brushing or flossing your teeth. Report any injuries, bruising, or red spots on the skin to your healthcare professional. If you are going to need surgery or other procedure, tell your healthcare professional that you are taking this medication. Wear a medical ID bracelet or chain. Carry a card that describes your disease and details of your medication and dosage times. What side effects may I notice from receiving this medication? Side effects that you should report to your care team as soon as possible: Allergic reactions--skin rash, itching, hives, swelling of the face, lips, tongue, or throat Bleeding--bloody or black, tar-like stools, vomiting blood or brown material that looks like coffee grounds, red or dark brown urine, small red or purple spots on the skin, unusual bruising or bleeding Bleeding in the brain--severe headache, stiff neck, confusion, dizziness, change in vision, numbness or weakness of the face, arm, or leg, trouble speaking, trouble walking, vomiting Heavy periods This list may not describe all possible side effects. Call your doctor for medical advice about side effects. You may report side effects to FDA at 1-800-FDA-1088. Where should I keep my medication? Keep out of the reach of children  and pets. Store at room temperature between 20 and 25 degrees C (68 and 77 degrees F). Get rid of any unused medication after the expiration date. To get rid of medications that are no longer needed or expired: Take the medication to a medication take-back program. Check with your pharmacy or law enforcement to find a location. If you cannot return the medication, check the label or package insert to see if the medication should be thrown out in the garbage or flushed down the toilet. If you are not sure, ask your care team. If it is safe to put in the trash, empty the medication out of the container. Mix the medication with cat litter, dirt, coffee grounds, or other unwanted substance. Seal the mixture in a bag or container. Put it in the trash. NOTE: This sheet is a summary. It may not cover all possible information. If you have questions about this medicine, talk to your doctor, pharmacist, or health care provider.  2022 Elsevier/Gold Standard (2020-09-10 00:00:00)

## 2021-07-08 ENCOUNTER — Telehealth: Payer: Self-pay

## 2021-07-08 MED ORDER — OZEMPIC (1 MG/DOSE) 4 MG/3ML ~~LOC~~ SOPN: 1.0000 mg | PEN_INJECTOR | SUBCUTANEOUS | Status: AC

## 2021-07-08 NOTE — Addendum Note (Signed)
Addended by: Leavy Cella on: 07/08/2021 09:42 AM   Modules accepted: Orders

## 2021-07-08 NOTE — Telephone Encounter (Signed)
Added chronic medication back to her medication list.

## 2021-07-08 NOTE — Telephone Encounter (Signed)
"  Call can not be completed at this time"- line didn't ring.  Attempted to contact pt regarding medication pickup and patient assistance re enrollment.

## 2021-07-11 NOTE — Telephone Encounter (Signed)
I appreciate Dr. Valentina Lucks handling.

## 2021-07-12 ENCOUNTER — Ambulatory Visit: Payer: Medicare Other | Admitting: Cardiology

## 2021-07-12 NOTE — Telephone Encounter (Signed)
Spoke with pt regarding med pickup and re-enrollment for assistance. Pt would like application placed with medication and she will return it at her earliest convenience.   Re-enrollment app is with medication in fridge in med room.

## 2021-07-18 ENCOUNTER — Telehealth: Payer: Self-pay

## 2021-07-18 DIAGNOSIS — I69354 Hemiplegia and hemiparesis following cerebral infarction affecting left non-dominant side: Secondary | ICD-10-CM

## 2021-07-18 NOTE — Telephone Encounter (Signed)
Patient calls nurse line requesting referral for PT to be sent to [facility].   Patient reports that she goes to the Rehab center for exercise and PT there told her that she should request referral for PT.   Please advise.   Talbot Grumbling, RN

## 2021-07-18 NOTE — Telephone Encounter (Signed)
Referral entered as requested.  

## 2021-07-19 ENCOUNTER — Ambulatory Visit: Payer: Medicare Other | Admitting: Cardiology

## 2021-07-25 NOTE — Progress Notes (Signed)
Cardiology Office Note:    Date:  07/26/2021   ID:  Karen Jackson, DOB Apr 14, 1941, MRN [id number]  PCP:  Zenia Resides, MD  Cardiologist:  None  Electrophysiologist:  Vickie Epley, MD   Referring MD: Vickie Epley, MD   Chief Complaint  Patient presents with   Shortness of Breath    History of Present Illness:    Karen Jackson is a 80 y.o. female with a hx of hemorrhagic CVA in 2013, paroxysmal atrial fibrillation, chronic diastolic heart failure, hypertension, hyperlipidemia who presents for initial visit for diastolic heart failure.  She follows with Dr. Quentin Ore in EP for her A. fib.  Was seen on 07/05/2021 and started on Eliquis.  Noted to be warm and wet on exam, Lasix was increased to 80 mg twice daily x3 days then resume Lasix 80 mg daily.  Echo 06/03/2021 showed EF 50 to 55%, normal RV function, mild biatrial enlargement, mild MR, mild to moderate TR. Calcium score 2093 (97th percentile) on 05/25/2021.  She denies any bleeding issues and starting Eliquis.  Does report she has been feeling short of breath.  Denies any chest pain, lightheadedness, or syncope.  Does report lower extremity edema.  Reports feels short of breath with minimal exertion.  Quit smoking 40 years ago.  Reports no history of heart disease in her immediate family.    Past Medical History:  Diagnosis Date   Acute cystitis without hematuria    Acute diastolic CHF (congestive heart failure) (HCC)    Arthritis    Dyspnea    Dysrhythmia    Fever of unknown origin 03/19/2017   Hyperlipidemia    Hypertension    denies at preop   Multifocal pneumonia    Neuromuscular disorder (HCC)    neuropathy left arm and foot   Osteopenia    Paralysis (HCC)    partial left side from CVA    Persistent atrial fibrillation (HCC)    PONV (postoperative nausea and vomiting)    Pre-diabetes    Stroke [facility]) 2013   hemmorahgic    Past Surgical History:  Procedure Laterality Date   ANKLE SURGERY      APPENDECTOMY     CHOLECYSTECTOMY     HERNIA REPAIR     Esophagus   JOINT REPLACEMENT     total- right partial- left   MASTECTOMY PARTIAL / LUMPECTOMY  2012   left   ORIF ANKLE FRACTURE Left 07/20/2018   Procedure: OPEN REDUCTION INTERNAL FIXATION (ORIF) ANKLE FRACTURE;  Surgeon: Wylene Simmer, MD;  Location: [facility];  Service: Orthopedics;  Laterality: Left;   TOTAL KNEE ARTHROPLASTY Left 01/27/2019   Procedure: TOTAL KNEE ARTHROPLASTY;  Surgeon: Vickey Huger, MD;  Location: [facility];  Service: Orthopedics;  Laterality: Left;    Current Medications: Current Meds  Medication Sig   albuterol (VENTOLIN HFA) 108 (90 Base) MCG/ACT inhaler Inhale 2 puffs into the lungs every 6 (six) hours as needed for wheezing or shortness of breath.   apixaban (ELIQUIS) 5 MG TABS tablet Take 1 tablet (5 mg total) by mouth 2 (two) times daily.   atorvastatin (LIPITOR) 20 MG tablet TAKE 1 TABLET BY MOUTH  DAILY   Calcium Carb-Cholecalciferol (CALCIUM CARBONATE-VITAMIN D3) 600-400 MG-UNIT TABS Take 1 tablet by mouth daily.   celecoxib (CELEBREX) 200 MG capsule TAKE 1 CAPSULE BY MOUTH  DAILY AS NEEDED FOR  ARTHRITIS PAIN   diclofenac Sodium (VOLTAREN) 1 % GEL APPLY 2 GRAMS EXTERNALLY TO THE AFFECTED AREA FOUR TIMES  DAILY   ergocalciferol (VITAMIN D2) 1.25 MG (50000 UT) capsule 50,000 unit   fluocinonide (LIDEX) 0.05 % external solution APPLY 1 ML TOPICALLY TO THE SCALP TWICE DAILY   furosemide (LASIX) 80 MG tablet TAKE 1 TABLET BY MOUTH  DAILY   gabapentin (NEURONTIN) 100 MG capsule TAKE 1 CAPSULE BY MOUTH 3  TIMES DAILY   hydrocortisone 2.5 % cream Apply topically 2 (two) times daily as needed.   lidocaine (LIDODERM) 5 % Place 1 patch onto the skin daily. Remove & Discard patch within 12 hours or as directed by MD   MAGNESIUM-OXIDE 400 (240 Mg) MG tablet Take 1 tablet by mouth daily.   MAGNESIUM-OXIDE 400 (241.3 Mg) MG tablet TAKE 1 TABLET BY MOUTH DAILY   metoprolol tartrate (LOPRESSOR) 25 MG tablet Take 1 tablet  (25 mg total) by mouth 2 (two) times daily.   Multiple Vitamins-Minerals (HAIR/SKIN/NAILS) CAPS Take 3 capsules by mouth daily.   omeprazole (PRILOSEC) 20 MG capsule TAKE 1 CAPSULE BY MOUTH  DAILY   Semaglutide, 1 MG/DOSE, (OZEMPIC, 1 MG/DOSE,) 4 MG/3ML SOPN Inject 1 mg into the skin once a week.   traZODone (DESYREL) 50 MG tablet TAKE 1 TABLET BY MOUTH AT  BEDTIME   TURMERIC PO Take 1 capsule by mouth daily.     Allergies:   Codeine phosphate and Simvastatin   Social History   Socioeconomic History   Marital status: Widowed    Spouse name: Not on file   Number of children: 2   Years of education: college   Highest education level: Not on file  Occupational History   Occupation: RetiredGames developer  Tobacco Use   Smoking status: Former    Packs/day: 0.50    Years: 23.00    Pack years: 11.50    Types: Cigarettes    Start date: 08/28/1957    Quit date: 03/09/1981    Years since quitting: 40.4   Smokeless tobacco: Never  Vaping Use   Vaping Use: Never used  Substance and Sexual Activity   Alcohol use: Yes    Alcohol/week: 16.0 standard drinks    Types: 2 Glasses of wine, 14 Standard drinks or equivalent per week    Comment: daily one glass   Drug use: No   Sexual activity: Not Currently  Other Topics Concern   Not on file  Social History Narrative   ** Merged History Encounter ** Health Care POA: son, Charly RayEmergency Contact: Ann and Roosvelt Harps, niece & nephew (c) [phone] (h) [phone] of Life Plan: Who lives with you: selfAny pets: noneDiet: Pt has a varied diet of protein, vegetables and limits "white" foods.Exercise: Pt does water aerobics 3x week, golf 2x weekSeatbelts: Pt reports wearing seatbelt when in vehicle. Sun Exposure/Protection: Pt reports wearing sun screenHobbies: golfing, swimming    Lives alone (has caregivers coming in)   R handed    Social Determinants of Health   Financial Resource Strain: Not on file  Food Insecurity: Not on file  Transportation  Needs: Not on file  Physical Activity: Not on file  Stress: Not on file  Social Connections: Not on file     Family History: The patient's family history includes Dementia in her father; Diabetes in her mother; Transient ischemic attack in her father.  ROS:   Please see the history of present illness.     All other systems reviewed and are negative.  EKGs/Labs/Other Studies Reviewed:    The following studies were reviewed today:   EKG:  EKG is  not ordered today  Recent Labs: 03/10/2021: ALT 13; B Natriuretic Peptide 144.7; Hemoglobin 14.4; Platelets 296 05/17/2021: BUN 16; Creatinine, Ser 0.59; Potassium 4.4; Sodium 140  Recent Lipid Panel    Component Value Date/Time   CHOL 191 12/01/2019 1406   TRIG 162 (H) 12/01/2019 1406   HDL 62 12/01/2019 1406   CHOLHDL 3.1 12/01/2019 1406   CHOLHDL 2.5 06/17/2015 1057   VLDL 23 06/17/2015 1057   LDLCALC 101 (H) 12/01/2019 1406   LDLDIRECT 107 (H) 04/24/2014 1118    Physical Exam:    VS:  BP 125/62   Pulse 81   Ht 5' (1.524 m)   Wt 170 lb (77.1 kg)   SpO2 94%   BMI 33.20 kg/m     Wt Readings from Last 3 Encounters:  07/26/21 170 lb (77.1 kg)  07/05/21 171 lb (77.6 kg)  05/17/21 170 lb (77.1 kg)     GEN:  Well nourished, well developed in no acute distress HEENT: Normal NECK: No JVD; No carotid bruits LYMPHATICS: No lymphadenopathy CARDIAC: irregular, normal rate, no murmurs, rubs, gallops RESPIRATORY:  Clear to auscultation without rales, wheezing or rhonchi  ABDOMEN: Soft, non-tender, non-distended MUSCULOSKELETAL:  No edema; No deformity  SKIN: Warm and dry NEUROLOGIC:  Alert and oriented x 3 PSYCHIATRIC:  Normal affect   ASSESSMENT:    1. Coronary artery disease involving native coronary artery of native heart, unspecified whether angina present   2. Chronic diastolic heart failure ([place])   3. Shortness of breath   4. Persistent atrial fibrillation ([place])   5. Hyperlipidemia, unspecified hyperlipidemia type     PLAN:     CAD:  Calcium score 2093 (97th percentile) on 05/25/2021.  She is reporting dyspnea with minimal exertion, which could represent anginal equivalent. -Recommend Lexiscan Myoview to rule out ischemia -Continue Eliquis -Continue atorvastatin  Chronic diastolic heart failure: Echo 06/03/2021 showed EF 50 to 55%, normal RV function, mild biatrial enlargement, mild MR, mild to moderate TR, indeterminate diastolic function.  Appears euvolemic -Continue Lasix 80 mg daily.  Check BMP, BNP  Dyspnea: She reports dyspnea with minimal exertion.  Plan [employer] as above.  We will also check chest x-ray and BNP, CBC, TSH  Persistent atrial fibrillation: Follows with EP.  History of hemorrhagic stroke in 2013.  Has been off anticoagulation, Dr. Quentin Ore discussed with Dr. Leonie Man in neurology and agreed to rechallenge with Eliquis.  Started on Eliquis 5 mg twice daily.  On metoprolol 25 mg twice daily for rate control.  -Continue Eliquis 5 mg twice daily -Continue metoprolol, will check Zio patch x7 days to evaluate for rate control  Hemorrhagic CVA: In 2013.  Monitor with restarting Eliquis as above.  Hypertension: On metoprolol 25 mg twice daily, Lasix 80 mg daily.  Appears controlled  Hyperlipidemia: On atorvastatin 20 mg daily.  Will check lipid panel, goal LDL less than 70 given CAD as above   RTC in 3 months   Shared Decision Making/Informed Consent The risks [chest pain, shortness of breath, cardiac arrhythmias, dizziness, blood pressure fluctuations, myocardial infarction, stroke/transient ischemic attack, nausea, vomiting, allergic reaction, radiation exposure, metallic taste sensation and life-threatening complications (estimated to be 1 in 10,000)], benefits (risk stratification, diagnosing coronary artery disease, treatment guidance) and alternatives of a nuclear stress test were discussed in detail with Ms. Guest and she agrees to proceed.     Medication Adjustments/Labs  and Tests Ordered: Current medicines are reviewed at length with the patient today.  Concerns regarding medicines are outlined  above.  Orders Placed This Encounter  Procedures   DG Chest 2 View   Brain natriuretic peptide   Comprehensive metabolic panel   CBC   TSH   Lipid panel   MYOCARDIAL PERFUSION IMAGING   LONG TERM MONITOR (3-14 DAYS)   No orders of the defined types were placed in this encounter.   Patient Instructions  Medication Instructions:  Your physician recommends that you continue on your current medications as directed. Please refer to the Current Medication list given to you today.  *If you need a refill on your cardiac medications before your next appointment, please call your pharmacy*   Lab Work: CMET, CBC, Lipid, TSH, BNP today  If you have labs (blood work) drawn today and your tests are completely normal, you will receive your results only by: [place] (if you have MyChart) OR A paper copy in the mail If you have any lab test that is abnormal or we need to change your treatment, we will call you to review the results.   Testing/Procedures: Your physician has requested that you have a Lexicographer (at [employer]). For further information please visit [email or web address]. Please follow instruction sheet, as given.   How to prepare for your Myocardial Perfusion Test: Do not eat or drink 3 hours prior to your test, except you may have water. Do not consume products containing caffeine (regular or decaffeinated) 12 hours prior to your test. (ex: coffee, chocolate, sodas, tea). Do bring a list of your current medications with you.  If not listed below, you may take your medications as normal. Do wear comfortable clothes (no dresses or overalls) and walking shoes, tennis shoes preferred (No heels or open toe shoes are allowed). Do NOT wear cologne, perfume, aftershave, or lotions (deodorant is allowed). The test will take approximately 3 to 4  hours to complete If these instructions are not followed, your test will have to be rescheduled.  A chest x-ray takes a picture of the organs and structures inside the chest, including the heart, lungs, and blood vessels. This test can show several things, including, whether the heart is enlarges; whether fluid is building up in the lungs; and whether pacemaker / defibrillator leads are still in place.  Follow-Up: At [facility], you and your health needs are our priority.  As part of our continuing mission to provide you with exceptional heart care, we have created designated Provider Care Teams.  These Care Teams include your primary Cardiologist (physician) and Advanced Practice Providers (APPs -  Physician Assistants and Nurse Practitioners) who all work together to provide you with the care you need, when you need it.  We recommend signing up for the patient portal called "MyChart".  Sign up information is provided on this After Visit Summary.  MyChart is used to connect with patients for Virtual Visits (Telemedicine).  Patients are able to view lab/test results, encounter notes, upcoming appointments, etc.  Non-urgent messages can be sent to your provider as well.   To learn more about what you can do with MyChart, go to [email or web address].    Your next appointment:   3 months with PA or NP  6 months with Dr. Gardiner Rhyme    Signed, Donato Heinz, MD  07/26/2021 5:28 PM    [place]

## 2021-07-26 ENCOUNTER — Ambulatory Visit (INDEPENDENT_AMBULATORY_CARE_PROVIDER_SITE_OTHER): Payer: Medicare Other

## 2021-07-26 ENCOUNTER — Ambulatory Visit (INDEPENDENT_AMBULATORY_CARE_PROVIDER_SITE_OTHER): Payer: Medicare Other | Admitting: Cardiology

## 2021-07-26 ENCOUNTER — Encounter: Payer: Self-pay | Admitting: Cardiology

## 2021-07-26 ENCOUNTER — Other Ambulatory Visit: Payer: Self-pay

## 2021-07-26 VITALS — BP 125/62 | HR 81 | Ht 60.0 in | Wt 170.0 lb

## 2021-07-26 DIAGNOSIS — I5032 Chronic diastolic (congestive) heart failure: Secondary | ICD-10-CM

## 2021-07-26 DIAGNOSIS — I2510 Atherosclerotic heart disease of native coronary artery without angina pectoris: Secondary | ICD-10-CM

## 2021-07-26 DIAGNOSIS — R0602 Shortness of breath: Secondary | ICD-10-CM

## 2021-07-26 DIAGNOSIS — I4819 Other persistent atrial fibrillation: Secondary | ICD-10-CM

## 2021-07-26 DIAGNOSIS — E785 Hyperlipidemia, unspecified: Secondary | ICD-10-CM | POA: Diagnosis not present

## 2021-07-26 DIAGNOSIS — I480 Paroxysmal atrial fibrillation: Secondary | ICD-10-CM | POA: Diagnosis not present

## 2021-07-26 NOTE — Progress Notes (Unsigned)
Enrolled patient for a 7 day Zio XT monitor to be mailed to patients home.  

## 2021-07-26 NOTE — Patient Instructions (Signed)
Medication Instructions:  Your physician recommends that you continue on your current medications as directed. Please refer to the Current Medication list given to you today.  *If you need a refill on your cardiac medications before your next appointment, please call your pharmacy*   Lab Work: CMET, CBC, Lipid, TSH, BNP today  If you have labs (blood work) drawn today and your tests are completely normal, you will receive your results only by: [place] (if you have MyChart) OR A paper copy in the mail If you have any lab test that is abnormal or we need to change your treatment, we will call you to review the results.   Testing/Procedures: Your physician has requested that you have a Lexicographer (at [employer]). For further information please visit [email or web address]. Please follow instruction sheet, as given.   How to prepare for your Myocardial Perfusion Test: Do not eat or drink 3 hours prior to your test, except you may have water. Do not consume products containing caffeine (regular or decaffeinated) 12 hours prior to your test. (ex: coffee, chocolate, sodas, tea). Do bring a list of your current medications with you.  If not listed below, you may take your medications as normal. Do wear comfortable clothes (no dresses or overalls) and walking shoes, tennis shoes preferred (No heels or open toe shoes are allowed). Do NOT wear cologne, perfume, aftershave, or lotions (deodorant is allowed). The test will take approximately 3 to 4 hours to complete If these instructions are not followed, your test will have to be rescheduled.  A chest x-ray takes a picture of the organs and structures inside the chest, including the heart, lungs, and blood vessels. This test can show several things, including, whether the heart is enlarges; whether fluid is building up in the lungs; and whether pacemaker / defibrillator leads are still in place.  Follow-Up: At [facility], you and  your health needs are our priority.  As part of our continuing mission to provide you with exceptional heart care, we have created designated Provider Care Teams.  These Care Teams include your primary Cardiologist (physician) and Advanced Practice Providers (APPs -  Physician Assistants and Nurse Practitioners) who all work together to provide you with the care you need, when you need it.  We recommend signing up for the patient portal called "MyChart".  Sign up information is provided on this After Visit Summary.  MyChart is used to connect with patients for Virtual Visits (Telemedicine).  Patients are able to view lab/test results, encounter notes, upcoming appointments, etc.  Non-urgent messages can be sent to your provider as well.   To learn more about what you can do with MyChart, go to [email or web address].    Your next appointment:   3 months with PA or NP  6 months with Dr. Gardiner Rhyme

## 2021-07-26 NOTE — Addendum Note (Signed)
Addended by: Oswaldo Milian on: 07/26/2021 05:34 PM   Modules accepted: Level of Service

## 2021-07-27 LAB — CBC
Hematocrit: 38.8 % (ref 34.0–46.6)
Hemoglobin: 13.4 g/dL (ref 11.1–15.9)
MCH: 33.3 pg — ABNORMAL HIGH (ref 26.6–33.0)
MCHC: 34.5 g/dL (ref 31.5–35.7)
MCV: 96 fL (ref 79–97)
Platelets: 308 10*3/uL (ref 150–450)
RBC: 4.03 x10E6/uL (ref 3.77–5.28)
RDW: 12.2 % (ref 11.7–15.4)
WBC: 7.5 10*3/uL (ref 3.4–10.8)

## 2021-07-27 LAB — COMPREHENSIVE METABOLIC PANEL
ALT: 15 IU/L (ref 0–32)
AST: 21 IU/L (ref 0–40)
Albumin/Globulin Ratio: 1.4 (ref 1.2–2.2)
Albumin: 3.9 g/dL (ref 3.6–4.6)
Alkaline Phosphatase: 84 IU/L (ref 44–121)
BUN/Creatinine Ratio: 34 — ABNORMAL HIGH (ref 12–28)
BUN: 21 mg/dL (ref 8–27)
Bilirubin Total: 0.4 mg/dL (ref 0.0–1.2)
CO2: 31 mmol/L — ABNORMAL HIGH (ref 20–29)
Calcium: 9.2 mg/dL (ref 8.7–10.3)
Chloride: 92 mmol/L — ABNORMAL LOW (ref 96–106)
Creatinine, Ser: 0.61 mg/dL (ref 0.57–1.00)
Globulin, Total: 2.7 g/dL (ref 1.5–4.5)
Glucose: 125 mg/dL — ABNORMAL HIGH (ref 70–99)
Potassium: 3.6 mmol/L (ref 3.5–5.2)
Sodium: 140 mmol/L (ref 134–144)
Total Protein: 6.6 g/dL (ref 6.0–8.5)
eGFR: 90 mL/min/{1.73_m2} (ref >59)

## 2021-07-27 LAB — LIPID PANEL
Chol/HDL Ratio: 2.9 ratio (ref 0.0–4.4)
Cholesterol, Total: 183 mg/dL (ref 100–199)
HDL: 63 mg/dL (ref >39)
LDL Chol Calc (NIH): 87 mg/dL (ref 0–99)
Triglycerides: 198 mg/dL — ABNORMAL HIGH (ref 0–149)
VLDL Cholesterol Cal: 33 mg/dL (ref 5–40)

## 2021-07-27 LAB — BRAIN NATRIURETIC PEPTIDE: BNP: 118.8 pg/mL — ABNORMAL HIGH (ref 0.0–100.0)

## 2021-07-27 LAB — TSH: TSH: 1.46 u[IU]/mL (ref 0.450–4.500)

## 2021-07-29 ENCOUNTER — Ambulatory Visit: Payer: Medicare Other | Admitting: Cardiovascular Disease

## 2021-07-30 DIAGNOSIS — I4819 Other persistent atrial fibrillation: Secondary | ICD-10-CM

## 2021-08-01 ENCOUNTER — Other Ambulatory Visit: Payer: Self-pay | Admitting: *Deleted

## 2021-08-01 DIAGNOSIS — E7800 Pure hypercholesterolemia, unspecified: Secondary | ICD-10-CM

## 2021-08-01 MED ORDER — ATORVASTATIN CALCIUM 40 MG PO TABS: 40.0000 mg | ORAL_TABLET | Freq: Every day | ORAL | 3 refills | Status: DC

## 2021-08-02 ENCOUNTER — Telehealth (HOSPITAL_COMMUNITY): Payer: Self-pay | Admitting: *Deleted

## 2021-08-02 NOTE — Telephone Encounter (Signed)
Patient given detailed instructions per Myocardial Perfusion Study Information Sheet for the test on 08/09/21 Patient notified to arrive 15 minutes early and that it is imperative to arrive on time for appointment to keep from having the test rescheduled.  If you need to cancel or reschedule your appointment, please call the office within 24 hours of your appointment. . Patient verbalized understanding. ,  Jacqueline  

## 2021-08-03 ENCOUNTER — Encounter: Payer: Self-pay | Admitting: Physical Therapy

## 2021-08-03 ENCOUNTER — Other Ambulatory Visit: Payer: Self-pay

## 2021-08-03 ENCOUNTER — Ambulatory Visit: Payer: Medicare Other | Attending: Family Medicine | Admitting: Physical Therapy

## 2021-08-03 DIAGNOSIS — M25572 Pain in left ankle and joints of left foot: Secondary | ICD-10-CM | POA: Insufficient documentation

## 2021-08-03 DIAGNOSIS — R293 Abnormal posture: Secondary | ICD-10-CM | POA: Insufficient documentation

## 2021-08-03 DIAGNOSIS — R262 Difficulty in walking, not elsewhere classified: Secondary | ICD-10-CM | POA: Insufficient documentation

## 2021-08-03 DIAGNOSIS — M25512 Pain in left shoulder: Secondary | ICD-10-CM | POA: Diagnosis not present

## 2021-08-03 DIAGNOSIS — R270 Ataxia, unspecified: Secondary | ICD-10-CM | POA: Insufficient documentation

## 2021-08-03 DIAGNOSIS — I6990 Unspecified sequelae of unspecified cerebrovascular disease: Secondary | ICD-10-CM | POA: Diagnosis not present

## 2021-08-03 DIAGNOSIS — M6281 Muscle weakness (generalized): Secondary | ICD-10-CM | POA: Insufficient documentation

## 2021-08-03 DIAGNOSIS — I69354 Hemiplegia and hemiparesis following cerebral infarction affecting left non-dominant side: Secondary | ICD-10-CM | POA: Diagnosis not present

## 2021-08-03 DIAGNOSIS — R278 Other lack of coordination: Secondary | ICD-10-CM | POA: Insufficient documentation

## 2021-08-03 NOTE — Therapy (Signed)
[place]. [place], [place], [address] Phone: [phone]   Fax:  [phone]  Physical Therapy Evaluation  Patient Details  Name: Karen Jackson MRN: [id number] Date of Birth: 04-02-1941 Referring Provider (PT): Hensel   Encounter Date: 08/03/2021   PT End of Session - 08/03/21 1042     Visit Number 1    Date for PT Re-Evaluation 11/01/21    Authorization Type Medicare    PT Start Time 1000    PT Stop Time 0240    PT Time Calculation (min) 52 min    Activity Tolerance Patient tolerated treatment well;Patient limited by fatigue    Behavior During Therapy [facility] for tasks assessed/performed             Past Medical History:  Diagnosis Date   Acute cystitis without hematuria    Acute diastolic CHF (congestive heart failure) (HCC)    Arthritis    Dyspnea    Dysrhythmia    Fever of unknown origin 03/19/2017   Hyperlipidemia    Hypertension    denies at preop   Multifocal pneumonia    Neuromuscular disorder ([place])    neuropathy left arm and foot   Osteopenia    Paralysis ([place])    partial left side from CVA    Persistent atrial fibrillation (HCC)    PONV (postoperative nausea and vomiting)    Pre-diabetes    Stroke [facility]) 2013   hemmorahgic    Past Surgical History:  Procedure Laterality Date   ANKLE SURGERY     APPENDECTOMY     CHOLECYSTECTOMY     HERNIA REPAIR     Esophagus   JOINT REPLACEMENT     total- right partial- left   MASTECTOMY PARTIAL / LUMPECTOMY  2012   left   ORIF ANKLE FRACTURE Left 07/20/2018   Procedure: OPEN REDUCTION INTERNAL FIXATION (ORIF) ANKLE FRACTURE;  Surgeon: Wylene Simmer, MD;  Location: [place];  Service: Orthopedics;  Laterality: Left;   TOTAL KNEE ARTHROPLASTY Left 01/27/2019   Procedure: TOTAL KNEE ARTHROPLASTY;  Surgeon: Vickey Huger, MD;  Location[facility];  Service: Orthopedics;  Laterality: Left;    There were no vitals filed for this visit.    Subjective  Assessment - 08/03/21 1013     Subjective Patient very familiar to me from years due to pain, L TKA, L ORIF ankle, R ankle fusion, R RC repair, and stroke.  I last saw her in April working on transfers and walking.  She has had issues with blood pressure recently and is being evaluated for A-fib and other BP issues.  She denies falls, she has a caregiver about 11 hours a day for help at home.  She wears a left ankle brace but reports recently the left ankle rolls and has left ankle pain.    Limitations Standing;Walking;House hold activities    Patient Stated Goals be stronger, move better, easier, be more safe and stable, less ankle pain    Currently in Pain? Yes    Pain Score 8     Pain Location Shoulder    Pain Orientation Left    Pain Descriptors / Indicators Spasm;Tightness;Sore    Pain Onset More than a month ago    Pain Frequency Constant    Aggravating Factors  left shoulder hurts pretty consistently, worse with use, tends to gaurd pain an 8/10, the left ankle hurts with standing if it is not stable she rates it a 7-10/10, she reports  that this pain is new    Pain Relieving Factors heat helps, brace help    Effect of Pain on Daily Activities reports just hurts most days                [facility] PT Assessment - 08/03/21 0001       Assessment   Medical Diagnosis weakness, difficulty walking, left shioulder pain, left ankle pain    Referring Provider (PT) Hensel    Onset Date/Surgical Date 07/04/21    Hand Dominance Right    Prior Therapy yes      Precautions   Precautions Fall      Balance Screen   Has the patient fallen in the past 6 months No    Has the patient had a decrease in activity level because of a fear of falling?  Yes    Is the patient reluctant to leave their home because of a fear of falling?  Yes      Home Environment   Additional Comments lives alone, single story, has 11 hours a day care giver, at night she has a puric for urination so she does not have to  get out of bed      Prior Function   Level of Independence Needs assistance with ADLs;Needs assistance with homemaking;Needs assistance with transfers    Leisure plays cards      ROM / Strength   AROM / PROM / Strength AROM;Strength      AROM   Overall AROM Comments all shoulder pain is with significant crepitus    AROM Assessment Site Shoulder;Knee;Ankle    Right/Left Shoulder Left    Left Shoulder Flexion 30 Degrees    Left Shoulder ABduction 10 Degrees    Right/Left Knee Left    Left Knee Extension 15    Left Knee Flexion 75    Right/Left Ankle Left    Left Ankle Dorsiflexion 0    Left Ankle Plantar Flexion 30    Left Ankle Inversion 0    Left Ankle Eversion 0      Strength   Overall Strength Comments left shoulder is very limited and has pain, left hand is spastic but mobile, left ankle PF/DF 4-/5 but very limited EV/INV    Strength Assessment Site Knee    Right/Left Knee Left    Left Knee Flexion 3+/5    Left Knee Extension 3+/5      Transfers   Comments with set up can get up with arm rests with CGA/Min A, left ankle rolls laterally at times      Ambulation/Gait   Gait Comments with FWW, slow, shoulders very guarded, can walk 70 feet and then needs a standing rest, then can go to 100 feet total      Standardized Balance Assessment   Standardized Balance Assessment Timed Up and Go Test      Timed Up and Go Test   Normal TUG (seconds) 72    TUG Comments with FWW, was 34 seconds at D/C in April                        Objective measurements completed on examination: See above findings.                  PT Short Term Goals - 08/03/21 1056       PT SHORT TERM GOAL #1   Title independent with initial HEP    Time 4  Period Weeks    Status New               PT Long Term Goals - 08/03/21 1056       PT LONG TERM GOAL #1   Title walk with FWW x 150 feet without rest with supervision only    Time 12    Period Weeks     Status New      PT LONG TERM GOAL #2   Title increase left knee strength to 4/5    Time 12    Period Weeks    Status New      PT LONG TERM GOAL #3   Title decrease TUG to 33 seconds    Time 12    Period Weeks    Status New      PT LONG TERM GOAL #4   Title transfer independently with set up    Time 12    Period Weeks    Status New      PT LONG TERM GOAL #5   Title report pain in the shoulder and ankle 50% less    Time 12    Period Weeks    Status New                    Plan - 08/03/21 1043     Clinical Impression Statement Patient reports that over the past 8 months she has gone "down hill", she reports some difficulty with the left ankle rolling, causing increased difficulty with transfers and difficulty walking, she has pain in the left ankle.  Her TUG went from 34 seconds in April to 72 seconds today.  She has to rest at 70 feet walking with the FWW but after about 15 seconds standing rest break she can go another 30 feet.  she is short of breath with this, Her O2 sats were 99% her pulse did increase to 112 but returned to 88 over a minute.  She has 11 hours of care daily, Requires Mod A to transfer at home    Stability/Clinical Decision Making Stable/Uncomplicated    Clinical Decision Making Low    Rehab Potential Good    PT Frequency 2x / week    PT Duration 12 weeks    PT Treatment/Interventions ADLs/Self Care Home Management;Electrical Stimulation;Moist Heat;Gait training;Functional mobility training;Therapeutic activities;Therapeutic exercise;Balance training;Neuromuscular re-education;Patient/family education;Manual techniques    PT Next Visit Plan Start,  working on strength and function, see if we can help pain    Consulted and Agree with Plan of Care Patient             Patient will benefit from skilled therapeutic intervention in order to improve the following deficits and impairments:  Abnormal gait, Decreased range of motion, Difficulty walking,  Impaired UE functional use, Increased muscle spasms, Cardiopulmonary status limiting activity, Decreased endurance, Decreased activity tolerance, Pain, Decreased balance, Decreased mobility, Decreased strength  Visit Diagnosis: Ataxia - Plan: PT plan of care cert/re-cert  Hemiplegia and hemiparesis following cerebral infarction affecting left non-dominant side ([place]) - Plan: PT plan of care cert/re-cert  Muscle weakness (generalized) - Plan: PT plan of care cert/re-cert  Other lack of coordination - Plan: PT plan of care cert/re-cert  Difficulty in walking, not elsewhere classified - Plan: PT plan of care cert/re-cert  Late effects of CVA (cerebrovascular accident) - Plan: PT plan of care cert/re-cert  Left shoulder pain, unspecified chronicity - Plan: PT plan of care cert/re-cert  Pain in left ankle and  joints of left foot - Plan: PT plan of care cert/re-cert     Problem List Patient Active Problem List   Diagnosis Date Noted   Secondary hypercoagulable state ([place]) 03/16/2021   Dizziness 03/10/2021   Orthostatic hypotension 10/15/2020   Presbycusis of both ears 03/08/2020   Mixed stress and urge urinary incontinence 12/02/2019   Macrocytosis 12/01/2019   Nutritional anemia 12/01/2019   S/P total knee replacement 01/27/2019   Recurrent left knee instability 07/05/2018   Respiratory failure with hypoxia ([place]) 08/30/2017   Hypoxemia    Heart failure with preserved ejection fraction (HCC), Grade 3 diastolic dysfunction 09/47/0962   PAF (paroxysmal atrial fibrillation) ([place])    Dyspnea 03/19/2017   Encounter for preventive health examination 02/17/2016   Sensorineural hearing loss (SNHL), bilateral 01/26/2016   Morbid obesity ([place]) 06/17/2015   Hypomagnesemia 04/24/2014   Hemiparesis affecting left side as late effect of cerebrovascular accident ([place]) 04/24/2014   Nontraumatic cerebral hemorrhage ([place]) 04/30/2012   DM (diabetes mellitus) with complications ([place]) 83/66/2947    OSTEOPENIA 01/21/2009   UNSPECIFIED VITAMIN D DEFICIENCY 11/19/2007   HYPERCHOLESTEROLEMIA 10/25/2006   GASTROESOPHAGEAL REFLUX, NO ESOPHAGITIS 10/25/2006   DIVERTICULOSIS OF COLON 10/25/2006   Osteoarthritis 10/25/2006   CERVICAL SPINE DISORDER, NOS 10/25/2006    Sumner Boast, PT 08/03/2021, 11:03 AM  [place]. [place], [place], [address] Phone: [phone]   Fax:  [phone]  Name: COLANDRA OHANIAN MRN: [id number] Date of Birth: 1940-11-12

## 2021-08-09 ENCOUNTER — Other Ambulatory Visit: Payer: Self-pay

## 2021-08-09 ENCOUNTER — Encounter (HOSPITAL_COMMUNITY): Payer: Medicare Other

## 2021-08-09 ENCOUNTER — Ambulatory Visit (HOSPITAL_COMMUNITY): Payer: Medicare Other | Attending: Cardiology

## 2021-08-09 DIAGNOSIS — R0602 Shortness of breath: Secondary | ICD-10-CM | POA: Diagnosis not present

## 2021-08-09 LAB — MYOCARDIAL PERFUSION IMAGING
LV dias vol: 43 mL (ref 46–106)
LV sys vol: 18 mL
Nuc Stress EF: 58 %
Peak HR: 123 {beats}/min
Rest HR: 86 {beats}/min
Rest Nuclear Isotope Dose: 10.5 mCi
SDS: 3
SRS: 0
SSS: 3
ST Depression (mm): 0 mm
Stress Nuclear Isotope Dose: 32.2 mCi
TID: 0.88

## 2021-08-09 MED ORDER — TECHNETIUM TC 99M TETROFOSMIN IV KIT
32.2000 | PACK | Freq: Once | INTRAVENOUS | Status: AC | PRN
  Administered 2021-08-09: 32.2 via INTRAVENOUS
  Filled 2021-08-09: qty 33

## 2021-08-09 MED ORDER — REGADENOSON 0.4 MG/5ML IV SOLN
0.4000 mg | Freq: Once | INTRAVENOUS | Status: AC
  Administered 2021-08-09: 0.4 mg via INTRAVENOUS

## 2021-08-09 MED ORDER — TECHNETIUM TC 99M TETROFOSMIN IV KIT
10.5000 | PACK | Freq: Once | INTRAVENOUS | Status: AC | PRN
  Administered 2021-08-09: 10.5 via INTRAVENOUS
  Filled 2021-08-09: qty 11

## 2021-08-10 ENCOUNTER — Ambulatory Visit: Payer: Medicare Other | Admitting: Neurology

## 2021-08-10 DIAGNOSIS — I4819 Other persistent atrial fibrillation: Secondary | ICD-10-CM | POA: Diagnosis not present

## 2021-08-11 ENCOUNTER — Ambulatory Visit: Payer: Medicare Other | Admitting: Physical Therapy

## 2021-08-11 ENCOUNTER — Encounter: Payer: Self-pay | Admitting: Physical Therapy

## 2021-08-11 ENCOUNTER — Other Ambulatory Visit: Payer: Self-pay

## 2021-08-11 DIAGNOSIS — I6990 Unspecified sequelae of unspecified cerebrovascular disease: Secondary | ICD-10-CM | POA: Diagnosis not present

## 2021-08-11 DIAGNOSIS — R270 Ataxia, unspecified: Secondary | ICD-10-CM | POA: Diagnosis not present

## 2021-08-11 DIAGNOSIS — M25572 Pain in left ankle and joints of left foot: Secondary | ICD-10-CM

## 2021-08-11 DIAGNOSIS — R278 Other lack of coordination: Secondary | ICD-10-CM | POA: Diagnosis not present

## 2021-08-11 DIAGNOSIS — R262 Difficulty in walking, not elsewhere classified: Secondary | ICD-10-CM

## 2021-08-11 DIAGNOSIS — R293 Abnormal posture: Secondary | ICD-10-CM

## 2021-08-11 DIAGNOSIS — M6281 Muscle weakness (generalized): Secondary | ICD-10-CM

## 2021-08-11 DIAGNOSIS — I69354 Hemiplegia and hemiparesis following cerebral infarction affecting left non-dominant side: Secondary | ICD-10-CM

## 2021-08-11 DIAGNOSIS — M25512 Pain in left shoulder: Secondary | ICD-10-CM

## 2021-08-11 NOTE — Therapy (Signed)
[place]. [place], [place], [address] Phone: [phone]   Fax:  [phone]  Physical Therapy Treatment  Patient Details  Name: Karen Jackson MRN: [id number] Date of Birth: [birth year] Referring Provider (PT): Hensel   Encounter Date: 08/11/2021   PT End of Session - 08/11/21 1147     Visit Number 2    Date for PT Re-Evaluation 11/01/21    Authorization Type Medicare    PT Start Time 1055    PT Stop Time 1144    PT Time Calculation (min) 49 min    Activity Tolerance Patient tolerated treatment well;Patient limited by fatigue    Behavior During Therapy [facility] for tasks assessed/performed             Past Medical History:  Diagnosis Date   Acute cystitis without hematuria    Acute diastolic CHF (congestive heart failure) (HCC)    Arthritis    Dyspnea    Dysrhythmia    Fever of unknown origin 03/19/2017   Hyperlipidemia    Hypertension    denies at preop   Multifocal pneumonia    Neuromuscular disorder ([place])    neuropathy left arm and foot   Osteopenia    Paralysis ([place])    partial left side from CVA    Persistent atrial fibrillation (HCC)    PONV (postoperative nausea and vomiting)    Pre-diabetes    Stroke [facility]) 2013   hemmorahgic    Past Surgical History:  Procedure Laterality Date   ANKLE SURGERY     APPENDECTOMY     CHOLECYSTECTOMY     HERNIA REPAIR     Esophagus   JOINT REPLACEMENT     total- right partial- left   MASTECTOMY PARTIAL / LUMPECTOMY  2012   left   ORIF ANKLE FRACTURE Left 07/20/2018   Procedure: OPEN REDUCTION INTERNAL FIXATION (ORIF) ANKLE FRACTURE;  Surgeon: Wylene Simmer, MD;  Location: [place];  Service: Orthopedics;  Laterality: Left;   TOTAL KNEE ARTHROPLASTY Left 01/27/2019   Procedure: TOTAL KNEE ARTHROPLASTY;  Surgeon: Vickey Huger, MD;  [facility];  Service: Orthopedics;  Laterality: Left;    There were no vitals filed for this visit.   Subjective  Assessment - 08/11/21 1100     Subjective Patient reports that she had a stress test yesterday, reports that she had some right ankle swelling and edness after that but it went away.  Reports c/o left shoulder pain due to they had to lift her onto table yesterday    Currently in Pain? Yes    Pain Score 8     Pain Location Shoulder    Pain Orientation Left    Pain Descriptors / Indicators Tightness;Sore                               [facility] Adult PT Treatment/Exercise - 08/11/21 0001       Ambulation/Gait   Gait Comments gait with FWW, CGA 100'x2      High Level Balance   High Level Balance Comments 4'toe taps with walker and CGA      Exercises   Exercises Knee/Hip      Knee/Hip Exercises: Aerobic   Nustep level 5 x 7 minutes      Knee/Hip Exercises: Seated   Long Arc Quad Left;3 sets;10 reps    Long Arc Quad Weight 5 lbs.    Other Seated Knee/Hip  Exercises yellow tband bilateral ankle PF/DF    Other Seated Knee/Hip Exercises worked on left hip adduction, really tried to isolate this due to weakness with yellow tband    Marching Left;2 sets;10 reps    Marching Weights 5 lbs.    Hamstring Curl Left;3 sets;10 reps    Hamstring Limitations red tband                     PT Education - 08/11/21 1152     Education Details went over HEP for ankle PF/DF with Tband, Hs curls with tband, LAQ's    Person(s) Educated Patient    Methods Explanation    Comprehension Verbalized understanding              PT Short Term Goals - 08/11/21 1152       PT SHORT TERM GOAL #1   Title independent with initial HEP    Status On-going               PT Long Term Goals - 08/03/21 1056       PT LONG TERM GOAL #1   Title walk with FWW x 150 feet without rest with supervision only    Time 12    Period Weeks    Status New      PT LONG TERM GOAL #2   Title increase left knee strength to 4/5    Time 12    Period Weeks    Status New      PT LONG  TERM GOAL #3   Title decrease TUG to 33 seconds    Time 12    Period Weeks    Status New      PT LONG TERM GOAL #4   Title transfer independently with set up    Time 12    Period Weeks    Status New      PT LONG TERM GOAL #5   Title report pain in the shoulder and ankle 50% less    Time 12    Period Weeks    Status New                   Plan - 08/11/21 1149     Clinical Impression Statement Patient with some shoulder and ankle pain from having to be lifted and pulled up onto a table for a stress test.  She did very well with the 4" toe taps, the walking does fatigue her and the transfer into chair when she is tired is very unsafe as she just sits prior to turning around and does not get close to the chair    PT Next Visit Plan Start,  working on strength and function, see if we can help pain    Consulted and Agree with Plan of Care Patient             Patient will benefit from skilled therapeutic intervention in order to improve the following deficits and impairments:  Abnormal gait, Decreased range of motion, Difficulty walking, Impaired UE functional use, Increased muscle spasms, Cardiopulmonary status limiting activity, Decreased endurance, Decreased activity tolerance, Pain, Decreased balance, Decreased mobility, Decreased strength  Visit Diagnosis: Ataxia  Hemiplegia and hemiparesis following cerebral infarction affecting left non-dominant side (HCC)  Muscle weakness (generalized)  Other lack of coordination  Difficulty in walking, not elsewhere classified  Late effects of CVA (cerebrovascular accident)  Left shoulder pain, unspecified chronicity  Pain in left ankle and joints of left  foot  Abnormal posture     Problem List Patient Active Problem List   Diagnosis Date Noted   Secondary hypercoagulable state ([place]) 03/16/2021   Dizziness 03/10/2021   Orthostatic hypotension 10/15/2020   Presbycusis of both ears 03/08/2020   Mixed stress and  urge urinary incontinence 12/02/2019   Macrocytosis 12/01/2019   Nutritional anemia 12/01/2019   S/P total knee replacement 01/27/2019   Recurrent left knee instability 07/05/2018   Respiratory failure with hypoxia ([place]) 08/30/2017   Hypoxemia    Heart failure with preserved ejection fraction (HCC), Grade 3 diastolic dysfunction 18/34/3735   PAF (paroxysmal atrial fibrillation) ([place])    Dyspnea 03/19/2017   Encounter for preventive health examination 02/17/2016   Sensorineural hearing loss (SNHL), bilateral 01/26/2016   Morbid obesity ([place]) 06/17/2015   Hypomagnesemia 04/24/2014   Hemiparesis affecting left side as late effect of cerebrovascular accident ([place]) 04/24/2014   Nontraumatic cerebral hemorrhage ([place]) 04/30/2012   DM (diabetes mellitus) with complications ([place]) 78/97/8478   OSTEOPENIA 01/21/2009   UNSPECIFIED VITAMIN D DEFICIENCY 11/19/2007   HYPERCHOLESTEROLEMIA 10/25/2006   GASTROESOPHAGEAL REFLUX, NO ESOPHAGITIS 10/25/2006   DIVERTICULOSIS OF COLON 10/25/2006   Osteoarthritis 10/25/2006   CERVICAL SPINE DISORDER, NOS 10/25/2006    Sumner Boast, PT 08/11/2021, 11:54 AM  [place]. [place], [place], [address] Phone: [phone]   Fax:  [phone]  Name: NADEAN MONTANARO MRN: [id number] Date of Birth: 1941-07-31

## 2021-08-12 ENCOUNTER — Other Ambulatory Visit: Payer: Self-pay | Admitting: Family Medicine

## 2021-08-15 ENCOUNTER — Other Ambulatory Visit: Payer: Self-pay

## 2021-08-15 ENCOUNTER — Encounter: Payer: Self-pay | Admitting: Physical Therapy

## 2021-08-15 ENCOUNTER — Ambulatory Visit: Payer: Medicare Other | Admitting: Physical Therapy

## 2021-08-15 DIAGNOSIS — M25572 Pain in left ankle and joints of left foot: Secondary | ICD-10-CM

## 2021-08-15 DIAGNOSIS — M6281 Muscle weakness (generalized): Secondary | ICD-10-CM

## 2021-08-15 DIAGNOSIS — M25512 Pain in left shoulder: Secondary | ICD-10-CM

## 2021-08-15 DIAGNOSIS — I6990 Unspecified sequelae of unspecified cerebrovascular disease: Secondary | ICD-10-CM | POA: Diagnosis not present

## 2021-08-15 DIAGNOSIS — I69354 Hemiplegia and hemiparesis following cerebral infarction affecting left non-dominant side: Secondary | ICD-10-CM | POA: Diagnosis not present

## 2021-08-15 DIAGNOSIS — R278 Other lack of coordination: Secondary | ICD-10-CM | POA: Diagnosis not present

## 2021-08-15 DIAGNOSIS — R293 Abnormal posture: Secondary | ICD-10-CM

## 2021-08-15 DIAGNOSIS — R270 Ataxia, unspecified: Secondary | ICD-10-CM | POA: Diagnosis not present

## 2021-08-15 DIAGNOSIS — R262 Difficulty in walking, not elsewhere classified: Secondary | ICD-10-CM | POA: Diagnosis not present

## 2021-08-15 NOTE — Therapy (Signed)
[address] [place], [place], [address] Phone: [phone]   Fax:  [phone]  Physical Therapy Treatment  Patient Details  Name: Karen Jackson MRN: [id number] Date of Birth: 1941/02/04 Referring Provider (PT): Hensel   Encounter Date: 08/15/2021   PT End of Session - 08/15/21 1151     Visit Number 3    Date for PT Re-Evaluation 11/01/21    Authorization Type Medicare    PT Start Time 1108    PT Stop Time 1149    PT Time Calculation (min) 41 min    Activity Tolerance Patient tolerated treatment well;Patient limited by fatigue    Behavior During Therapy [facility] for tasks assessed/performed             Past Medical History:  Diagnosis Date   Acute cystitis without hematuria    Acute diastolic CHF (congestive heart failure) (HCC)    Arthritis    Dyspnea    Dysrhythmia    Fever of unknown origin 03/19/2017   Hyperlipidemia    Hypertension    denies at preop   Multifocal pneumonia    Neuromuscular disorder ([place])    neuropathy left arm and foot   Osteopenia    Paralysis ([place])    partial left side from CVA    Persistent atrial fibrillation (HCC)    PONV (postoperative nausea and vomiting)    Pre-diabetes    Stroke [facility]) 2013   hemmorahgic    Past Surgical History:  Procedure Laterality Date   ANKLE SURGERY     APPENDECTOMY     CHOLECYSTECTOMY     HERNIA REPAIR     Esophagus   JOINT REPLACEMENT     total- right partial- left   MASTECTOMY PARTIAL / LUMPECTOMY  2012   left   ORIF ANKLE FRACTURE Left 07/20/2018   Procedure: OPEN REDUCTION INTERNAL FIXATION (ORIF) ANKLE FRACTURE;  Surgeon: Wylene Simmer, MD;  Location: [facility];  Service: Orthopedics;  Laterality: Left;   TOTAL KNEE ARTHROPLASTY Left 01/27/2019   Procedure: TOTAL KNEE ARTHROPLASTY;  Surgeon: Vickey Huger, MD;  [facility];  Service: Orthopedics;  Laterality: Left;    There were no vitals filed for this visit.   Subjective  Assessment - 08/15/21 1119     Subjective Patient reports that she had a great day after the last treatment, but then the left ankle started rolling the nex day, reports some pain.,    Currently in Pain? Yes    Pain Score 7     Pain Location Ankle    Pain Orientation Left    Pain Descriptors / Indicators Sore;Aching    Pain Relieving Factors movement helps                               [facility] Adult PT Treatment/Exercise - 08/15/21 0001       Ambulation/Gait   Gait Comments with FWW 100' x 2, then 75 feet x1 with CGA      High Level Balance   High Level Balance Activities Backward walking;Side stepping    High Level Balance Comments HHA, a lot of cues for this      Knee/Hip Exercises: Aerobic   Nustep level 6 x 8.5 minutes      Knee/Hip Exercises: Standing   Other Standing Knee Exercises 4" toe touches alternating      Knee/Hip Exercises: Seated   Long Arc Quad Left;3  sets;10 reps    Other Seated Knee/Hip Exercises yellow tband bilateral ankle PF/DF    Other Seated Knee/Hip Exercises worked on left hip adduction, really tried to isolate this due to weakness with yellow tband                       PT Short Term Goals - 08/11/21 1152       PT SHORT TERM GOAL #1   Title independent with initial HEP    Status On-going               PT Long Term Goals - 08/03/21 1056       PT LONG TERM GOAL #1   Title walk with FWW x 150 feet without rest with supervision only    Time 12    Period Weeks    Status New      PT LONG TERM GOAL #2   Title increase left knee strength to 4/5    Time 12    Period Weeks    Status New      PT LONG TERM GOAL #3   Title decrease TUG to 33 seconds    Time 12    Period Weeks    Status New      PT LONG TERM GOAL #4   Title transfer independently with set up    Time 12    Period Weeks    Status New      PT LONG TERM GOAL #5   Title report pain in the shoulder and ankle 50% less    Time 12     Period Weeks    Status New                   Plan - 08/15/21 1152     Clinical Impression Statement Still with some ankle pain, did some PROM and the tband and she reported it feeling better, She reports difficulty backing up with transfers with family members.  We worked on this but it is dificult for her.  The 100 feet distance is only tolerated wiht a small 15 second break to catch her breath.    PT Next Visit Plan work on strength safety and assure that family members can transfer her safely    Consulted and Agree with Plan of Care Patient             Patient will benefit from skilled therapeutic intervention in order to improve the following deficits and impairments:  Abnormal gait, Decreased range of motion, Difficulty walking, Impaired UE functional use, Increased muscle spasms, Cardiopulmonary status limiting activity, Decreased endurance, Decreased activity tolerance, Pain, Decreased balance, Decreased mobility, Decreased strength  Visit Diagnosis: Ataxia  Hemiplegia and hemiparesis following cerebral infarction affecting left non-dominant side (HCC)  Muscle weakness (generalized)  Other lack of coordination  Late effects of CVA (cerebrovascular accident)  Left shoulder pain, unspecified chronicity  Pain in left ankle and joints of left foot  Abnormal posture     Problem List Patient Active Problem List   Diagnosis Date Noted   Secondary hypercoagulable state ([place]) 03/16/2021   Dizziness 03/10/2021   Orthostatic hypotension 10/15/2020   Presbycusis of both ears 03/08/2020   Mixed stress and urge urinary incontinence 12/02/2019   Macrocytosis 12/01/2019   Nutritional anemia 12/01/2019   S/P total knee replacement 01/27/2019   Recurrent left knee instability 07/05/2018   Respiratory failure with hypoxia ([place]) 08/30/2017   Hypoxemia    Heart  failure with preserved ejection fraction (HCC), Grade 3 diastolic dysfunction 07/19/6243   PAF (paroxysmal  atrial fibrillation) ([place])    Dyspnea 03/19/2017   Encounter for preventive health examination 02/17/2016   Sensorineural hearing loss (SNHL), bilateral 01/26/2016   Morbid obesity ([place]) 06/17/2015   Hypomagnesemia 04/24/2014   Hemiparesis affecting left side as late effect of cerebrovascular accident ([place]) 04/24/2014   Nontraumatic cerebral hemorrhage ([place]) 04/30/2012   DM (diabetes mellitus) with complications ([place]) 69/50/7225   OSTEOPENIA 01/21/2009   UNSPECIFIED VITAMIN D DEFICIENCY 11/19/2007   HYPERCHOLESTEROLEMIA 10/25/2006   GASTROESOPHAGEAL REFLUX, NO ESOPHAGITIS 10/25/2006   DIVERTICULOSIS OF COLON 10/25/2006   Osteoarthritis 10/25/2006   CERVICAL SPINE DISORDER, NOS 10/25/2006    Sumner Boast, PT 08/15/2021, 11:56 AM  [place]. [place], [place], [address] Phone: [phone]   Fax:  [phone]  Name: DAYANIRA GIOVANNETTI MRN: [id number] Date of Birth: 1941/02/02

## 2021-08-16 ENCOUNTER — Ambulatory Visit
Admission: RE | Admit: 2021-08-16 | Discharge: 2021-08-16 | Disposition: A | Discharge status: Home / Self Care | Payer: Medicare Other | Source: Ambulatory Visit | Attending: Cardiology | Admitting: Cardiology

## 2021-08-16 DIAGNOSIS — R0602 Shortness of breath: Secondary | ICD-10-CM | POA: Diagnosis not present

## 2021-08-17 ENCOUNTER — Telehealth: Payer: Self-pay | Admitting: Cardiology

## 2021-08-17 NOTE — Progress Notes (Signed)
Received notification from [place] regarding RE-ENROLLMENT approval for [place]. Patient assistance approved from 08/28/21 to 08/27/22.  Phone: [phone]

## 2021-08-17 NOTE — Telephone Encounter (Signed)
Karen Jackson is calling requesting a callback to go over her chest Xray results. She would also like to receive her heart monitor results as well.

## 2021-08-17 NOTE — Telephone Encounter (Signed)
Called pt. She was made aware of her chest x-ray results, she is made aware the monitor results have not been reviewed buy dr. Gardiner Rhyme yet and we they are we will reach back out to her. She verbalized understanding and thanked me for calling her.

## 2021-08-18 ENCOUNTER — Ambulatory Visit: Payer: Medicare Other | Admitting: Physical Therapy

## 2021-08-18 ENCOUNTER — Encounter: Payer: Self-pay | Admitting: Physical Therapy

## 2021-08-18 ENCOUNTER — Other Ambulatory Visit: Payer: Self-pay

## 2021-08-18 DIAGNOSIS — R293 Abnormal posture: Secondary | ICD-10-CM

## 2021-08-18 DIAGNOSIS — R278 Other lack of coordination: Secondary | ICD-10-CM

## 2021-08-18 DIAGNOSIS — M25572 Pain in left ankle and joints of left foot: Secondary | ICD-10-CM

## 2021-08-18 DIAGNOSIS — M25512 Pain in left shoulder: Secondary | ICD-10-CM

## 2021-08-18 DIAGNOSIS — R262 Difficulty in walking, not elsewhere classified: Secondary | ICD-10-CM | POA: Diagnosis not present

## 2021-08-18 DIAGNOSIS — I69354 Hemiplegia and hemiparesis following cerebral infarction affecting left non-dominant side: Secondary | ICD-10-CM | POA: Diagnosis not present

## 2021-08-18 DIAGNOSIS — M6281 Muscle weakness (generalized): Secondary | ICD-10-CM | POA: Diagnosis not present

## 2021-08-18 DIAGNOSIS — I6990 Unspecified sequelae of unspecified cerebrovascular disease: Secondary | ICD-10-CM | POA: Diagnosis not present

## 2021-08-18 DIAGNOSIS — R270 Ataxia, unspecified: Secondary | ICD-10-CM | POA: Diagnosis not present

## 2021-08-18 NOTE — Therapy (Signed)
[place] [place], [place], [address] Phone: [phone]   Fax:  [phone]  Physical Therapy Treatment  Patient Details  Name: Karen Jackson MRN: [id number] Date of Birth: [birth year] Referring Provider (PT): Hensel   Encounter Date: 08/18/2021   PT End of Session - 08/18/21 1154     Visit Number 4    Date for PT Re-Evaluation 11/01/21    Authorization Type Medicare    PT Start Time 1050    PT Stop Time 1135    PT Time Calculation (min) 45 min    Activity Tolerance Patient tolerated treatment well;Patient limited by fatigue    Behavior During Therapy [facility] for tasks assessed/performed             Past Medical History:  Diagnosis Date   Acute cystitis without hematuria    Acute diastolic CHF (congestive heart failure) (HCC)    Arthritis    Dyspnea    Dysrhythmia    Fever of unknown origin 03/19/2017   Hyperlipidemia    Hypertension    denies at preop   Multifocal pneumonia    Neuromuscular disorder ([place])    neuropathy left arm and foot   Osteopenia    Paralysis ([place])    partial left side from CVA    Persistent atrial fibrillation (HCC)    PONV (postoperative nausea and vomiting)    Pre-diabetes    Stroke [facility]) 2013   hemmorahgic    Past Surgical History:  Procedure Laterality Date   ANKLE SURGERY     APPENDECTOMY     CHOLECYSTECTOMY     HERNIA REPAIR     Esophagus   JOINT REPLACEMENT     total- right partial- left   MASTECTOMY PARTIAL / LUMPECTOMY  2012   left   ORIF ANKLE FRACTURE Left 07/20/2018   Procedure: OPEN REDUCTION INTERNAL FIXATION (ORIF) ANKLE FRACTURE;  Surgeon: Wylene Simmer, MD;  Location: [facility];  Service: Orthopedics;  Laterality: Left;   TOTAL KNEE ARTHROPLASTY Left 01/27/2019   Procedure: TOTAL KNEE ARTHROPLASTY;  Surgeon: Vickey Huger, MD;  [facility];  Service: Orthopedics;  Laterality: Left;    There were no vitals filed for this visit.   Subjective  Assessment - 08/18/21 1103     Subjective Doing okay, I was diagnosed with pulmonary HTN    Currently in Pain? Yes    Pain Location Ankle    Pain Orientation Left    Pain Descriptors / Indicators Sore    Aggravating Factors  left shoulder hurts, left ankle hurts    Pain Relieving Factors you moving it helps                               [facility] Adult PT Treatment/Exercise - 08/18/21 0001       Ambulation/Gait   Gait Comments FWW 100' x 2, could not do more due to she c/o shortness of breath      Knee/Hip Exercises: Stretches   Gastroc Stretch Left;3 reps;20 seconds      Knee/Hip Exercises: Aerobic   Nustep level 5 x 8 minutes      Knee/Hip Exercises: Standing   Hip Abduction Both;1 set;10 reps    Other Standing Knee Exercises 4" toe touches alternating      Knee/Hip Exercises: Seated   Long Arc Quad Both;2 sets;10 reps    Long Arc Quad Weight 5 lbs.  Other Seated Knee/Hip Exercises yellow tband bilateral ankle PF/DF    Marching Both;2 sets;10 reps    Marching Weights 5 lbs.    Hamstring Curl Left;3 sets;10 reps    Hamstring Limitations red tband      Manual Therapy   Manual Therapy Passive ROM    Passive ROM left ankle for pain                       PT Short Term Goals - 08/18/21 1155       PT SHORT TERM GOAL #1   Status Partially Met               PT Long Term Goals - 08/18/21 1156       PT LONG TERM GOAL #1   Title walk with FWW x 150 feet without rest with supervision only    Status On-going      PT LONG TERM GOAL #2   Title increase left knee strength to 4/5    Status On-going                   Plan - 08/18/21 1154     Clinical Impression Statement Patient was a little more limited today with walking, she had c/o SOB and had some wheezing with the exercises.  Did well with the standing exercices, she does have left ankle pain and reports feels better with PROM and approximation    PT Next Visit Plan  work on strength safety and assure that family members can transfer her safely    Consulted and Agree with Plan of Care Patient             Patient will benefit from skilled therapeutic intervention in order to improve the following deficits and impairments:  Abnormal gait, Decreased range of motion, Difficulty walking, Impaired UE functional use, Increased muscle spasms, Cardiopulmonary status limiting activity, Decreased endurance, Decreased activity tolerance, Pain, Decreased balance, Decreased mobility, Decreased strength  Visit Diagnosis: Ataxia  Hemiplegia and hemiparesis following cerebral infarction affecting left non-dominant side (HCC)  Muscle weakness (generalized)  Other lack of coordination  Late effects of CVA (cerebrovascular accident)  Left shoulder pain, unspecified chronicity  Pain in left ankle and joints of left foot  Abnormal posture  Difficulty in walking, not elsewhere classified     Problem List Patient Active Problem List   Diagnosis Date Noted   Secondary hypercoagulable state ([place]) 03/16/2021   Dizziness 03/10/2021   Orthostatic hypotension 10/15/2020   Presbycusis of both ears 03/08/2020   Mixed stress and urge urinary incontinence 12/02/2019   Macrocytosis 12/01/2019   Nutritional anemia 12/01/2019   S/P total knee replacement 01/27/2019   Recurrent left knee instability 07/05/2018   Respiratory failure with hypoxia ([place]) 08/30/2017   Hypoxemia    Heart failure with preserved ejection fraction ([place]), Grade 3 diastolic dysfunction 54/00/8676   PAF (paroxysmal atrial fibrillation) ([place])    Dyspnea 03/19/2017   Encounter for preventive health examination 02/17/2016   Sensorineural hearing loss (SNHL), bilateral 01/26/2016   Morbid obesity ([place]) 06/17/2015   Hypomagnesemia 04/24/2014   Hemiparesis affecting left side as late effect of cerebrovascular accident ([place]) 04/24/2014   Nontraumatic cerebral hemorrhage ([place]) 04/30/2012   DM  (diabetes mellitus) with complications ([place]) 19/50/9326   OSTEOPENIA 01/21/2009   UNSPECIFIED VITAMIN D DEFICIENCY 11/19/2007   HYPERCHOLESTEROLEMIA 10/25/2006   GASTROESOPHAGEAL REFLUX, NO ESOPHAGITIS 10/25/2006   DIVERTICULOSIS OF COLON 10/25/2006   Osteoarthritis 10/25/2006   CERVICAL  SPINE DISORDER, NOS 10/25/2006    Sumner Boast, PT 08/18/2021, 11:56 AM  [place]. [place], [place], [address] Phone: [phone]   Fax:  [phone]  Name: Karen Jackson MRN: [id number] Date of Birth: 01/27/1941

## 2021-08-23 ENCOUNTER — Ambulatory Visit: Payer: Medicare Other | Admitting: Physical Therapy

## 2021-08-23 ENCOUNTER — Other Ambulatory Visit: Payer: Self-pay

## 2021-08-23 ENCOUNTER — Encounter: Payer: Self-pay | Admitting: Physical Therapy

## 2021-08-23 DIAGNOSIS — M25512 Pain in left shoulder: Secondary | ICD-10-CM

## 2021-08-23 DIAGNOSIS — M6281 Muscle weakness (generalized): Secondary | ICD-10-CM | POA: Diagnosis not present

## 2021-08-23 DIAGNOSIS — R270 Ataxia, unspecified: Secondary | ICD-10-CM

## 2021-08-23 DIAGNOSIS — I6990 Unspecified sequelae of unspecified cerebrovascular disease: Secondary | ICD-10-CM

## 2021-08-23 DIAGNOSIS — R293 Abnormal posture: Secondary | ICD-10-CM

## 2021-08-23 DIAGNOSIS — R262 Difficulty in walking, not elsewhere classified: Secondary | ICD-10-CM | POA: Diagnosis not present

## 2021-08-23 DIAGNOSIS — R278 Other lack of coordination: Secondary | ICD-10-CM | POA: Diagnosis not present

## 2021-08-23 DIAGNOSIS — I69354 Hemiplegia and hemiparesis following cerebral infarction affecting left non-dominant side: Secondary | ICD-10-CM | POA: Diagnosis not present

## 2021-08-23 DIAGNOSIS — M25572 Pain in left ankle and joints of left foot: Secondary | ICD-10-CM

## 2021-08-23 NOTE — Therapy (Signed)
[place] [place], [place], [address] Phone: [phone]   Fax:  [phone]  Physical Therapy Treatment  Patient Details  Name: Karen Jackson MRN: [id number] Date of Birth: [birth year] Referring Provider (PT): Hensel   Encounter Date: 08/23/2021   PT End of Session - 08/23/21 1155     Visit Number 5    Date for PT Re-Evaluation 11/01/21    Authorization Type Medicare    PT Start Time 1055    PT Stop Time 1145    PT Time Calculation (min) 50 min    Activity Tolerance Patient tolerated treatment well;Patient limited by fatigue    Behavior During Therapy [facility] for tasks assessed/performed             Past Medical History:  Diagnosis Date   Acute cystitis without hematuria    Acute diastolic CHF (congestive heart failure) (HCC)    Arthritis    Dyspnea    Dysrhythmia    Fever of unknown origin 03/19/2017   Hyperlipidemia    Hypertension    denies at preop   Multifocal pneumonia    Neuromuscular disorder ([place])    neuropathy left arm and foot   Osteopenia    Paralysis ([place])    partial left side from CVA    Persistent atrial fibrillation (HCC)    PONV (postoperative nausea and vomiting)    Pre-diabetes    Stroke [facility]) 2013   hemmorahgic    Past Surgical History:  Procedure Laterality Date   ANKLE SURGERY     APPENDECTOMY     CHOLECYSTECTOMY     HERNIA REPAIR     Esophagus   JOINT REPLACEMENT     total- right partial- left   MASTECTOMY PARTIAL / LUMPECTOMY  2012   left   ORIF ANKLE FRACTURE Left 07/20/2018   Procedure: OPEN REDUCTION INTERNAL FIXATION (ORIF) ANKLE FRACTURE;  Surgeon: Wylene Simmer, MD;  Location: [facility];  Service: Orthopedics;  Laterality: Left;   TOTAL KNEE ARTHROPLASTY Left 01/27/2019   Procedure: TOTAL KNEE ARTHROPLASTY;  Surgeon: Vickey Huger, MD;  [facility];  Service: Orthopedics;  Laterality: Left;    There were no vitals filed for this visit.   Subjective  Assessment - 08/23/21 1102     Subjective I did not do much this weekend, ankle hurts today    Currently in Pain? Yes    Pain Score 6     Pain Location Ankle    Pain Orientation Left    Aggravating Factors  walking                               [facility] Adult PT Treatment/Exercise - 08/23/21 0001       Ambulation/Gait   Gait Comments FWW 2x100, then 1x125 feet, very short break at 24' to catch breath      Knee/Hip Exercises: Aerobic   Nustep level 5 x 8 minutes      Knee/Hip Exercises: Seated   Long Arc Quad Both;2 sets;10 reps    Other Seated Knee/Hip Exercises red tband bilateral ankle PF/DF    Hamstring Curl Left;3 sets;10 reps    Hamstring Limitations red tband      Manual Therapy   Manual Therapy Passive ROM    Passive ROM left ankle for pain  PT Short Term Goals - 08/18/21 1155       PT SHORT TERM GOAL #1   Status Partially Met               PT Long Term Goals - 08/18/21 1156       PT LONG TERM GOAL #1   Title walk with FWW x 150 feet without rest with supervision only    Status On-going      PT LONG TERM GOAL #2   Title increase left knee strength to 4/5    Status On-going                   Plan - 08/23/21 1155     Clinical Impression Statement Patient was able to go the 3rd distance with walking but again does need rest to catch breath, she also needs cues on the nustep due to the left hip abducting out.  Has some ankle pain and some left upper trap pain, she does report not doing much this weekend    PT Next Visit Plan see if we can work on supine exercises for core strength             Patient will benefit from skilled therapeutic intervention in order to improve the following deficits and impairments:  Abnormal gait, Decreased range of motion, Difficulty walking, Impaired UE functional use, Increased muscle spasms, Cardiopulmonary status limiting activity, Decreased endurance,  Decreased activity tolerance, Pain, Decreased balance, Decreased mobility, Decreased strength  Visit Diagnosis: Ataxia  Hemiplegia and hemiparesis following cerebral infarction affecting left non-dominant side (HCC)  Muscle weakness (generalized)  Other lack of coordination  Late effects of CVA (cerebrovascular accident)  Left shoulder pain, unspecified chronicity  Pain in left ankle and joints of left foot  Abnormal posture  Difficulty in walking, not elsewhere classified     Problem List Patient Active Problem List   Diagnosis Date Noted   Secondary hypercoagulable state ([place]) 03/16/2021   Dizziness 03/10/2021   Orthostatic hypotension 10/15/2020   Presbycusis of both ears 03/08/2020   Mixed stress and urge urinary incontinence 12/02/2019   Macrocytosis 12/01/2019   Nutritional anemia 12/01/2019   S/P total knee replacement 01/27/2019   Recurrent left knee instability 07/05/2018   Respiratory failure with hypoxia ([place]) 08/30/2017   Hypoxemia    Heart failure with preserved ejection fraction (HCC), Grade 3 diastolic dysfunction 11/06/8116   PAF (paroxysmal atrial fibrillation) ([place])    Dyspnea 03/19/2017   Encounter for preventive health examination 02/17/2016   Sensorineural hearing loss (SNHL), bilateral 01/26/2016   Morbid obesity ([place]) 06/17/2015   Hypomagnesemia 04/24/2014   Hemiparesis affecting left side as late effect of cerebrovascular accident ([place]) 04/24/2014   Nontraumatic cerebral hemorrhage ([place]) 04/30/2012   DM (diabetes mellitus) with complications ([place]) 86/77/3736   OSTEOPENIA 01/21/2009   UNSPECIFIED VITAMIN D DEFICIENCY 11/19/2007   HYPERCHOLESTEROLEMIA 10/25/2006   GASTROESOPHAGEAL REFLUX, NO ESOPHAGITIS 10/25/2006   DIVERTICULOSIS OF COLON 10/25/2006   Osteoarthritis 10/25/2006   CERVICAL SPINE DISORDER, NOS 10/25/2006    Sumner Boast, PT 08/23/2021, 11:57 AM  [address] [place] [place], [place], [address] Phone: [phone]   Fax:  [phone]  Name: Karen Jackson MRN: [id number] Date of Birth: 12-06-1940

## 2021-08-24 ENCOUNTER — Encounter: Payer: Self-pay | Admitting: Podiatry

## 2021-08-24 ENCOUNTER — Ambulatory Visit (INDEPENDENT_AMBULATORY_CARE_PROVIDER_SITE_OTHER): Payer: Medicare Other | Admitting: Podiatry

## 2021-08-24 DIAGNOSIS — B351 Tinea unguium: Secondary | ICD-10-CM | POA: Diagnosis not present

## 2021-08-24 DIAGNOSIS — E118 Type 2 diabetes mellitus with unspecified complications: Secondary | ICD-10-CM

## 2021-08-24 DIAGNOSIS — L603 Nail dystrophy: Secondary | ICD-10-CM | POA: Diagnosis not present

## 2021-08-24 DIAGNOSIS — M79609 Pain in unspecified limb: Secondary | ICD-10-CM

## 2021-08-24 NOTE — Progress Notes (Addendum)
This patient returns to my office for at risk foot care.  This patient requires this care by a professional since this patient will be at risk due to having diabetes.  This patient is unable to cut nails herself since the patient cannot reach her nails.These nails are painful walking and wearing shoes. Patient has coagulation defect due to taking eliquis. Patient presents to the office with her caregiver and in a wheelchair. This patient presents for at risk foot care today.  General Appearance  Alert, conversant and in no acute stress.  Vascular  Dorsalis pedis and posterior tibial  pulses are palpable  bilaterally.  Capillary return is within normal limits  bilaterally. Temperature is within normal limits  bilaterally.  Neurologic  Senn-Weinstein monofilament wire test within normal limits  bilaterally. Muscle power within normal limits bilaterally.  Nails Thick disfigured discolored nails with subungual debris  from hallux to fifth toes bilaterally. No evidence of bacterial infection or drainage bilaterally.  Orthopedic  No limitations of motion  feet .  No crepitus or effusions noted.  No bony pathology or digital deformities noted.  Skin  normotropic skin with no porokeratosis noted bilaterally.  No signs of infections or ulcers noted.     Onychomycosis  Pain in right toes  Pain in left toes  Consent was obtained for treatment procedures.   Mechanical debridement of nails 1-5  bilaterally performed with a nail nipper.  Filed with dremel without incident.    Return office visit   3 months                   Told patient to return for periodic foot care and evaluation due to potential at risk complications.   Gardiner Barefoot DPM

## 2021-08-25 ENCOUNTER — Ambulatory Visit: Payer: Medicare Other | Admitting: Physical Therapy

## 2021-08-25 ENCOUNTER — Encounter: Payer: Self-pay | Admitting: Physical Therapy

## 2021-08-25 ENCOUNTER — Other Ambulatory Visit: Payer: Self-pay

## 2021-08-25 DIAGNOSIS — I69354 Hemiplegia and hemiparesis following cerebral infarction affecting left non-dominant side: Secondary | ICD-10-CM

## 2021-08-25 DIAGNOSIS — M25572 Pain in left ankle and joints of left foot: Secondary | ICD-10-CM

## 2021-08-25 DIAGNOSIS — M6281 Muscle weakness (generalized): Secondary | ICD-10-CM | POA: Diagnosis not present

## 2021-08-25 DIAGNOSIS — R262 Difficulty in walking, not elsewhere classified: Secondary | ICD-10-CM | POA: Diagnosis not present

## 2021-08-25 DIAGNOSIS — R270 Ataxia, unspecified: Secondary | ICD-10-CM | POA: Diagnosis not present

## 2021-08-25 DIAGNOSIS — M25512 Pain in left shoulder: Secondary | ICD-10-CM

## 2021-08-25 DIAGNOSIS — R278 Other lack of coordination: Secondary | ICD-10-CM

## 2021-08-25 DIAGNOSIS — I6990 Unspecified sequelae of unspecified cerebrovascular disease: Secondary | ICD-10-CM

## 2021-08-25 NOTE — Therapy (Signed)
[facility] [place]. [place], [place], [address] Phone: [phone]   Fax:  [phone]  Physical Therapy Treatment  Patient Details  Name: Karen Jackson MRN: [id number] Date of Birth: 10-12-1940 Referring Provider (PT): Hensel   Encounter Date: 08/25/2021   PT End of Session - 08/25/21 1012     Visit Number 6    Date for PT Re-Evaluation 11/01/21    Authorization Type Medicare    PT Start Time 0927    PT Stop Time 1010    PT Time Calculation (min) 43 min    Activity Tolerance Patient tolerated treatment well;Patient limited by fatigue    Behavior During Therapy [facility] for tasks assessed/performed             Past Medical History:  Diagnosis Date   Acute cystitis without hematuria    Acute diastolic CHF (congestive heart failure) (HCC)    Arthritis    Dyspnea    Dysrhythmia    Fever of unknown origin 03/19/2017   Hyperlipidemia    Hypertension    denies at preop   Multifocal pneumonia    Neuromuscular disorder ([place])    neuropathy left arm and foot   Osteopenia    Paralysis ([place])    partial left side from CVA    Persistent atrial fibrillation (HCC)    PONV (postoperative nausea and vomiting)    Pre-diabetes    Stroke [facility]) 2013   hemmorahgic    Past Surgical History:  Procedure Laterality Date   ANKLE SURGERY     APPENDECTOMY     CHOLECYSTECTOMY     HERNIA REPAIR     Esophagus   JOINT REPLACEMENT     total- right partial- left   MASTECTOMY PARTIAL / LUMPECTOMY  2012   left   ORIF ANKLE FRACTURE Left 07/20/2018   Procedure: OPEN REDUCTION INTERNAL FIXATION (ORIF) ANKLE FRACTURE;  Surgeon: Wylene Simmer, MD;  Location: [place];  Service: Orthopedics;  Laterality: Left;   TOTAL KNEE ARTHROPLASTY Left 01/27/2019   Procedure: TOTAL KNEE ARTHROPLASTY;  Surgeon: Vickey Huger, MD;  [facility];  Service: Orthopedics;  Laterality: Left;    There were no vitals filed for this visit.   Subjective  Assessment - 08/25/21 0932     Subjective Continues to have soreness int he left ankle and left shoulder    Currently in Pain? Yes    Pain Score 5     Pain Location Ankle   left shoulder                              [facility] Adult PT Treatment/Exercise - 08/25/21 0001       Ambulation/Gait   Gait Comments FWW 3x100', 1 x 75'      High Level Balance   High Level Balance Activities Side stepping;Backward walking    High Level Balance Comments in pbars      Knee/Hip Exercises: Aerobic   Nustep level 5 x 8 minutes      Knee/Hip Exercises: Standing   Heel Raises Both;15 reps    Hip Flexion Both;2 sets;10 reps    Hip Flexion Limitations in Pbars    Hip Abduction Both;1 set;10 reps    Abduction Limitations in pbars    Hip Extension Both;2 sets;10 reps    Extension Limitations in pbars      Knee/Hip Exercises: Seated   Long Arc Quad Both;2 sets;10 reps  Long Arc Quad Weight 5 lbs.    Other Seated Knee/Hip Exercises red tband bilateral ankle PF/DF      Manual Therapy   Passive ROM left ankle for pain                       PT Short Term Goals - 08/18/21 1155       PT SHORT TERM GOAL #1   Status Partially Met               PT Long Term Goals - 08/25/21 1013       PT LONG TERM GOAL #1   Title walk with FWW x 150 feet without rest with supervision only    Status On-going      PT LONG TERM GOAL #2   Title increase left knee strength to 4/5    Status On-going      PT LONG TERM GOAL #3   Title decrease TUG to 33 seconds    Status On-going                   Plan - 08/25/21 1012     Clinical Impression Statement pateint very fatigued with pbar exercises today, this caused her to be very SOB with the walking after these.  She reported less pain in the ankle after ankle exercises and movements    PT Next Visit Plan see if we can work on supine exercises for core strength    Consulted and Agree with Plan of Care Patient              Patient will benefit from skilled therapeutic intervention in order to improve the following deficits and impairments:  Abnormal gait, Decreased range of motion, Difficulty walking, Impaired UE functional use, Increased muscle spasms, Cardiopulmonary status limiting activity, Decreased endurance, Decreased activity tolerance, Pain, Decreased balance, Decreased mobility, Decreased strength  Visit Diagnosis: Ataxia  Hemiplegia and hemiparesis following cerebral infarction affecting left non-dominant side (HCC)  Muscle weakness (generalized)  Other lack of coordination  Late effects of CVA (cerebrovascular accident)  Left shoulder pain, unspecified chronicity  Pain in left ankle and joints of left foot     Problem List Patient Active Problem List   Diagnosis Date Noted   Secondary hypercoagulable state ([place]) 03/16/2021   Dizziness 03/10/2021   Orthostatic hypotension 10/15/2020   Presbycusis of both ears 03/08/2020   Mixed stress and urge urinary incontinence 12/02/2019   Macrocytosis 12/01/2019   Nutritional anemia 12/01/2019   S/P total knee replacement 01/27/2019   Recurrent left knee instability 07/05/2018   Respiratory failure with hypoxia ([place]) 08/30/2017   Hypoxemia    Heart failure with preserved ejection fraction (HCC), Grade 3 diastolic dysfunction 16/96/7893   PAF (paroxysmal atrial fibrillation) ([place])    Dyspnea 03/19/2017   Encounter for preventive health examination 02/17/2016   Sensorineural hearing loss (SNHL), bilateral 01/26/2016   Morbid obesity ([place]) 06/17/2015   Hypomagnesemia 04/24/2014   Hemiparesis affecting left side as late effect of cerebrovascular accident ([place]) 04/24/2014   Nontraumatic cerebral hemorrhage ([place]) 04/30/2012   DM (diabetes mellitus) with complications ([place]) 81/08/7508   OSTEOPENIA 01/21/2009   UNSPECIFIED VITAMIN D DEFICIENCY 11/19/2007   HYPERCHOLESTEROLEMIA 10/25/2006   GASTROESOPHAGEAL REFLUX, NO ESOPHAGITIS  10/25/2006   DIVERTICULOSIS OF COLON 10/25/2006   Osteoarthritis 10/25/2006   CERVICAL SPINE DISORDER, NOS 10/25/2006    Sumner Boast, PT 08/25/2021, 10:14 AM  [place]  [place], [place], [address] Phone: [phone]   Fax:  [phone]  Name: Karen Jackson MRN: [id number] Date of Birth: 09-Sep-1940

## 2021-08-26 ENCOUNTER — Other Ambulatory Visit: Payer: Self-pay

## 2021-08-26 MED ORDER — METOPROLOL SUCCINATE ER 50 MG PO TB24: 50.0000 mg | ORAL_TABLET | Freq: Every day | ORAL | 3 refills | Status: AC

## 2021-08-30 ENCOUNTER — Ambulatory Visit: Payer: Medicare Other | Admitting: Physical Therapy

## 2021-09-01 ENCOUNTER — Telehealth: Payer: Self-pay

## 2021-09-01 ENCOUNTER — Ambulatory Visit: Payer: Medicare Other | Attending: Family Medicine | Admitting: Physical Therapy

## 2021-09-01 ENCOUNTER — Encounter: Payer: Self-pay | Admitting: Physical Therapy

## 2021-09-01 ENCOUNTER — Other Ambulatory Visit: Payer: Self-pay

## 2021-09-01 DIAGNOSIS — R278 Other lack of coordination: Secondary | ICD-10-CM | POA: Diagnosis not present

## 2021-09-01 DIAGNOSIS — M6281 Muscle weakness (generalized): Secondary | ICD-10-CM

## 2021-09-01 DIAGNOSIS — I69354 Hemiplegia and hemiparesis following cerebral infarction affecting left non-dominant side: Secondary | ICD-10-CM | POA: Diagnosis not present

## 2021-09-01 DIAGNOSIS — R262 Difficulty in walking, not elsewhere classified: Secondary | ICD-10-CM | POA: Insufficient documentation

## 2021-09-01 DIAGNOSIS — I6990 Unspecified sequelae of unspecified cerebrovascular disease: Secondary | ICD-10-CM | POA: Diagnosis not present

## 2021-09-01 DIAGNOSIS — R0602 Shortness of breath: Secondary | ICD-10-CM

## 2021-09-01 DIAGNOSIS — M25572 Pain in left ankle and joints of left foot: Secondary | ICD-10-CM | POA: Diagnosis not present

## 2021-09-01 DIAGNOSIS — R293 Abnormal posture: Secondary | ICD-10-CM | POA: Diagnosis not present

## 2021-09-01 DIAGNOSIS — R270 Ataxia, unspecified: Secondary | ICD-10-CM | POA: Diagnosis not present

## 2021-09-01 DIAGNOSIS — M25512 Pain in left shoulder: Secondary | ICD-10-CM

## 2021-09-01 NOTE — Telephone Encounter (Signed)
Spoke with patient and advised her of Dr. Newman Nickels recommendations. Patient states that she is still short of breath but unsure what to do about it. Patient would like to know if Dr. Gardiner Rhyme has any recommendations for her regarding this.   Patient states that she started the Metoprolol Succinate and yesterday she states that she got light headed and dizzy and her BP dropped. Patient only able to report that SBP was 110. Patient unsure of the rest of her vitals. However patient took dose today at lunch time and reports that she feels well and denies any symptoms. Advised patient to monitor BP daily and write the numbers down to assess a trend in BP and to call with any issues. Advised I would forward message to Dr. Gardiner Rhyme for him to review and advise.

## 2021-09-01 NOTE — Therapy (Signed)
[place]. [place], [place], [address] Phone: [phone]   Fax:  [phone]  Physical Therapy Treatment  Patient Details  Name: Karen Jackson MRN: [id number] Date of Birth: 02-Nov-1940 Referring Provider (PT): Hensel   Encounter Date: 09/01/2021   PT End of Session - 09/01/21 1151     Visit Number 7    Date for PT Re-Evaluation 11/01/21    Authorization Type Medicare    PT Start Time 1052    PT Stop Time 1141    PT Time Calculation (min) 49 min    Activity Tolerance Patient tolerated treatment well;Patient limited by fatigue    Behavior During Therapy [facility] for tasks assessed/performed             Past Medical History:  Diagnosis Date   Acute cystitis without hematuria    Acute diastolic CHF (congestive heart failure) (HCC)    Arthritis    Dyspnea    Dysrhythmia    Fever of unknown origin 03/19/2017   Hyperlipidemia    Hypertension    denies at preop   Multifocal pneumonia    Neuromuscular disorder ([place])    neuropathy left arm and foot   Osteopenia    Paralysis ([place])    partial left side from CVA    Persistent atrial fibrillation (HCC)    PONV (postoperative nausea and vomiting)    Pre-diabetes    Stroke [facility]) 2013   hemmorahgic    Past Surgical History:  Procedure Laterality Date   ANKLE SURGERY     APPENDECTOMY     CHOLECYSTECTOMY     HERNIA REPAIR     Esophagus   JOINT REPLACEMENT     total- right partial- left   MASTECTOMY PARTIAL / LUMPECTOMY  2012   left   ORIF ANKLE FRACTURE Left 07/20/2018   Procedure: OPEN REDUCTION INTERNAL FIXATION (ORIF) ANKLE FRACTURE;  Surgeon: Wylene Simmer, MD;  Location: [place];  Service: Orthopedics;  Laterality: Left;   TOTAL KNEE ARTHROPLASTY Left 01/27/2019   Procedure: TOTAL KNEE ARTHROPLASTY;  Surgeon: Vickey Huger, MD;  [facility];  Service: Orthopedics;  Laterality: Left;    There were no vitals filed for this visit.   Subjective  Assessment - 09/01/21 1100     Subjective i took a new BP medicine and it really made me dizzy.  I am debating on when to take it again, I need to take once a day and with a big meal.  My blood pressure really went down    Currently in Pain? Yes    Pain Score 3     Pain Location Shoulder    Pain Orientation Left    Pain Descriptors / Indicators Sore;Tightness    Aggravating Factors  using walker                               [facility] Adult PT Treatment/Exercise - 09/01/21 0001       Ambulation/Gait   Gait Comments FWW 100' x 2      High Level Balance   High Level Balance Activities Side stepping;Backward walking    High Level Balance Comments in pbars, 6" toe touches, high knee hip flexion and trying to step up on a 2" step      Knee/Hip Exercises: Aerobic   Nustep level 5 x 8 minutes      Knee/Hip Exercises: Supine   Bridges 5  reps;2 sets    Bridges with Diona Foley Squeeze 10 reps;2 sets    [facility] with Clamshell 10 reps;2 sets    Other Supine Knee/Hip Exercises feet on ball K2C, trunk rotaiton, small bridges and isometric abs      Manual Therapy   Manual therapy comments STM to the left upper trap    Passive ROM left ankle for pain                       PT Short Term Goals - 08/18/21 1155       PT SHORT TERM GOAL #1   Status Partially Met               PT Long Term Goals - 09/01/21 1154       PT LONG TERM GOAL #1   Title walk with FWW x 150 feet without rest with supervision only    Status On-going      PT LONG TERM GOAL #2   Title increase left knee strength to 4/5    Status On-going      PT LONG TERM GOAL #3   Title decrease TUG to 33 seconds    Status On-going      PT LONG TERM GOAL #4   Title transfer independently with set up    Status On-going                   Plan - 09/01/21 1152     Clinical Impression Statement Patient reports an issue wwith BP meds this week , changed meds and got very light headed,  reports that the BP went down fast, she did not take yesterday, I did some supine activities which was difficult for her as they use a lot of large mms, this really made her tired and fatigued, unable to walk as far due to the shortness of breath.  She is having less ankle pain but the left upper trap and neck remain very tight due to compensation.  She really struggled with the 2" step up, she got very scared and asked to stop after about 4-5 times    PT Next Visit Plan see how the supine activities did    Consulted and Agree with Plan of Care Patient             Patient will benefit from skilled therapeutic intervention in order to improve the following deficits and impairments:  Abnormal gait, Decreased range of motion, Difficulty walking, Impaired UE functional use, Increased muscle spasms, Cardiopulmonary status limiting activity, Decreased endurance, Decreased activity tolerance, Pain, Decreased balance, Decreased mobility, Decreased strength  Visit Diagnosis: Ataxia  Hemiplegia and hemiparesis following cerebral infarction affecting left non-dominant side (HCC)  Muscle weakness (generalized)  Other lack of coordination  Late effects of CVA (cerebrovascular accident)  Left shoulder pain, unspecified chronicity  Pain in left ankle and joints of left foot     Problem List Patient Active Problem List   Diagnosis Date Noted   Secondary hypercoagulable state ([place]) 03/16/2021   Dizziness 03/10/2021   Orthostatic hypotension 10/15/2020   Presbycusis of both ears 03/08/2020   Mixed stress and urge urinary incontinence 12/02/2019   Macrocytosis 12/01/2019   Nutritional anemia 12/01/2019   S/P total knee replacement 01/27/2019   Recurrent left knee instability 07/05/2018   Respiratory failure with hypoxia ([place]) 08/30/2017   Hypoxemia    Heart failure with preserved ejection fraction ([place]), Grade 3 diastolic dysfunction 60/05/9322   PAF (paroxysmal  atrial fibrillation) ([place])     Dyspnea 03/19/2017   Encounter for preventive health examination 02/17/2016   Sensorineural hearing loss (SNHL), bilateral 01/26/2016   Morbid obesity ([place]) 06/17/2015   Hypomagnesemia 04/24/2014   Hemiparesis affecting left side as late effect of cerebrovascular accident ([place]) 04/24/2014   Nontraumatic cerebral hemorrhage ([place]) 04/30/2012   DM (diabetes mellitus) with complications ([place]) 96/29/5284   OSTEOPENIA 01/21/2009   UNSPECIFIED VITAMIN D DEFICIENCY 11/19/2007   HYPERCHOLESTEROLEMIA 10/25/2006   GASTROESOPHAGEAL REFLUX, NO ESOPHAGITIS 10/25/2006   DIVERTICULOSIS OF COLON 10/25/2006   Osteoarthritis 10/25/2006   CERVICAL SPINE DISORDER, NOS 10/25/2006    Sumner Boast, PT 09/01/2021, 11:55 AM  [place]. [place], [place], [address] Phone: [phone]   Fax:  [phone]  Name: Karen Jackson MRN: [id number] Date of Birth: Apr 04, 1941

## 2021-09-01 NOTE — Telephone Encounter (Signed)
-----   Message from Donato Heinz, MD sent at 08/31/2021  6:55 AM EST ----- Pulmonary hypertension could be contributing to symptoms, though was only in the mild range on recent echocardiogram

## 2021-09-02 NOTE — Telephone Encounter (Addendum)
Returned call to patient, advised patient of Dr. Newman Nickels recommendations. Patient would like to do the PFT's. These have been ordered and advised patient that someone will reach out to her to get these scheduled. Patient will also monitor BP. She reports that today her BP was 574 systolic and she feels well.   Advised patient to call back to office with any issues, questions, or concerns. Patient verbalized understanding.

## 2021-09-02 NOTE — Telephone Encounter (Signed)
Agree with plan for her blood pressure.  For her shortness of breath, cardiac work-up has been unremarkable.  Would check PFTs to evaluate for pulmonary causes and recommend PCP follow-up, may need referral to pulmonology based on results of PFTs

## 2021-09-02 NOTE — Addendum Note (Signed)
Addended by: Rexanne Mano B on: 09/02/2021 02:35 PM   Modules accepted: Orders

## 2021-09-05 ENCOUNTER — Ambulatory Visit: Payer: Medicare Other | Admitting: Physical Therapy

## 2021-09-05 ENCOUNTER — Encounter: Payer: Self-pay | Admitting: Physical Therapy

## 2021-09-05 ENCOUNTER — Other Ambulatory Visit: Payer: Self-pay

## 2021-09-05 DIAGNOSIS — R278 Other lack of coordination: Secondary | ICD-10-CM | POA: Diagnosis not present

## 2021-09-05 DIAGNOSIS — I6990 Unspecified sequelae of unspecified cerebrovascular disease: Secondary | ICD-10-CM

## 2021-09-05 DIAGNOSIS — M6281 Muscle weakness (generalized): Secondary | ICD-10-CM | POA: Diagnosis not present

## 2021-09-05 DIAGNOSIS — R270 Ataxia, unspecified: Secondary | ICD-10-CM | POA: Diagnosis not present

## 2021-09-05 DIAGNOSIS — M25572 Pain in left ankle and joints of left foot: Secondary | ICD-10-CM

## 2021-09-05 DIAGNOSIS — I69354 Hemiplegia and hemiparesis following cerebral infarction affecting left non-dominant side: Secondary | ICD-10-CM | POA: Diagnosis not present

## 2021-09-05 DIAGNOSIS — M25512 Pain in left shoulder: Secondary | ICD-10-CM | POA: Diagnosis not present

## 2021-09-05 NOTE — Therapy (Signed)
[place]. [place], [place], [address] Phone: [phone]   Fax:  [phone]  Physical Therapy Treatment  Patient Details  Name: Karen Jackson MRN: [id number] Date of Birth: May 14, 1941 Referring Provider (PT): Hensel   Encounter Date: 09/05/2021   PT End of Session - 09/05/21 1305     Visit Number 8    Date for PT Re-Evaluation 11/01/21    Authorization Type Medicare    PT Start Time 1055    PT Stop Time 1143    PT Time Calculation (min) 48 min    Activity Tolerance Patient tolerated treatment well;Patient limited by fatigue    Behavior During Therapy [facility] for tasks assessed/performed             Past Medical History:  Diagnosis Date   Acute cystitis without hematuria    Acute diastolic CHF (congestive heart failure) (HCC)    Arthritis    Dyspnea    Dysrhythmia    Fever of unknown origin 03/19/2017   Hyperlipidemia    Hypertension    denies at preop   Multifocal pneumonia    Neuromuscular disorder ([place])    neuropathy left arm and foot   Osteopenia    Paralysis ([place])    partial left side from CVA    Persistent atrial fibrillation (HCC)    PONV (postoperative nausea and vomiting)    Pre-diabetes    Stroke [facility]) 2013   hemmorahgic    Past Surgical History:  Procedure Laterality Date   ANKLE SURGERY     APPENDECTOMY     CHOLECYSTECTOMY     HERNIA REPAIR     Esophagus   JOINT REPLACEMENT     total- right partial- left   MASTECTOMY PARTIAL / LUMPECTOMY  2012   left   ORIF ANKLE FRACTURE Left 07/20/2018   Procedure: OPEN REDUCTION INTERNAL FIXATION (ORIF) ANKLE FRACTURE;  Surgeon: Wylene Simmer, MD;  Location: [place];  Service: Orthopedics;  Laterality: Left;   TOTAL KNEE ARTHROPLASTY Left 01/27/2019   Procedure: TOTAL KNEE ARTHROPLASTY;  Surgeon: Vickey Huger, MD;  [facility];  Service: Orthopedics;  Laterality: Left;    There were no vitals filed for this visit.   Subjective  Assessment - 09/05/21 1303     Subjective Patient reports that she is still taking the BP medication, reports that she is having less dizziness but still SOB, she has been referred to a pulmonologist    Currently in Pain? Yes    Pain Score 6     Pain Location Shoulder    Pain Orientation Left    Aggravating Factors  walking                               [facility] Adult PT Treatment/Exercise - 09/05/21 0001       Ambulation/Gait   Gait Comments FWW 100' x 2, 50 ' x 2      High Level Balance   High Level Balance Activities Side stepping;Backward walking    High Level Balance Comments had some left ankle pain with this today      Knee/Hip Exercises: Aerobic   Nustep level 5 x 8 minutes      Knee/Hip Exercises: Seated   Other Seated Knee/Hip Exercises red tband bilateral ankle PF/DF      Knee/Hip Exercises: Supine   Bridges 5 reps;2 sets    Bridges with [employer] 10  reps;2 sets    Bridges with Clamshell 10 reps;2 sets    Other Supine Knee/Hip Exercises feet on ball K2C, trunk rotaiton, small bridges and isometric abs      Manual Therapy   Manual therapy comments STM to the left upper trap    Passive ROM left ankle for pain                       PT Short Term Goals - 08/18/21 1155       PT SHORT TERM GOAL #1   Status Partially Met               PT Long Term Goals - 09/01/21 1154       PT LONG TERM GOAL #1   Title walk with FWW x 150 feet without rest with supervision only    Status On-going      PT LONG TERM GOAL #2   Title increase left knee strength to 4/5    Status On-going      PT LONG TERM GOAL #3   Title decrease TUG to 33 seconds    Status On-going      PT LONG TERM GOAL #4   Title transfer independently with set up    Status On-going                   Plan - 09/05/21 1305     Clinical Impression Statement Patient had increased left upper trap and neck pain today, I worked on this and she had a lot  of soreness and tightness, she had left ankle pain with side stepping today.  Still SOB with walking    PT Next Visit Plan continue to add as tolerated             Patient will benefit from skilled therapeutic intervention in order to improve the following deficits and impairments:  Abnormal gait, Decreased range of motion, Difficulty walking, Impaired UE functional use, Increased muscle spasms, Cardiopulmonary status limiting activity, Decreased endurance, Decreased activity tolerance, Pain, Decreased balance, Decreased mobility, Decreased strength  Visit Diagnosis: Ataxia  Hemiplegia and hemiparesis following cerebral infarction affecting left non-dominant side (HCC)  Muscle weakness (generalized)  Other lack of coordination  Late effects of CVA (cerebrovascular accident)  Left shoulder pain, unspecified chronicity  Pain in left ankle and joints of left foot     Problem List Patient Active Problem List   Diagnosis Date Noted   Secondary hypercoagulable state ([place]) 03/16/2021   Dizziness 03/10/2021   Orthostatic hypotension 10/15/2020   Presbycusis of both ears 03/08/2020   Mixed stress and urge urinary incontinence 12/02/2019   Macrocytosis 12/01/2019   Nutritional anemia 12/01/2019   S/P total knee replacement 01/27/2019   Recurrent left knee instability 07/05/2018   Respiratory failure with hypoxia ([place]) 08/30/2017   Hypoxemia    Heart failure with preserved ejection fraction ([place]), Grade 3 diastolic dysfunction 48/18/5631   PAF (paroxysmal atrial fibrillation) ([place])    Dyspnea 03/19/2017   Encounter for preventive health examination 02/17/2016   Sensorineural hearing loss (SNHL), bilateral 01/26/2016   Morbid obesity ([place]) 06/17/2015   Hypomagnesemia 04/24/2014   Hemiparesis affecting left side as late effect of cerebrovascular accident ([place]) 04/24/2014   Nontraumatic cerebral hemorrhage ([place]) 04/30/2012   DM (diabetes mellitus) with complications ([place])  49/70/2637   OSTEOPENIA 01/21/2009   UNSPECIFIED VITAMIN D DEFICIENCY 11/19/2007   HYPERCHOLESTEROLEMIA 10/25/2006   GASTROESOPHAGEAL REFLUX, NO ESOPHAGITIS 10/25/2006   DIVERTICULOSIS OF  COLON 10/25/2006   Osteoarthritis 10/25/2006   CERVICAL SPINE DISORDER, NOS 10/25/2006    Sumner Boast, PT 09/05/2021, 1:06 PM  [place]. [place], [place], [address] Phone: [phone]   Fax:  [phone]  Name: KEIMORA SWARTOUT MRN: [id number] Date of Birth: 1940/11/10

## 2021-09-06 DIAGNOSIS — Z85828 Personal history of other malignant neoplasm of skin: Secondary | ICD-10-CM | POA: Diagnosis not present

## 2021-09-06 DIAGNOSIS — L720 Epidermal cyst: Secondary | ICD-10-CM | POA: Diagnosis not present

## 2021-09-08 ENCOUNTER — Ambulatory Visit: Payer: Medicare Other | Admitting: Physical Therapy

## 2021-09-08 ENCOUNTER — Other Ambulatory Visit: Payer: Self-pay

## 2021-09-08 ENCOUNTER — Other Ambulatory Visit: Payer: Self-pay | Admitting: Family Medicine

## 2021-09-08 ENCOUNTER — Encounter: Payer: Self-pay | Admitting: Physical Therapy

## 2021-09-08 DIAGNOSIS — R270 Ataxia, unspecified: Secondary | ICD-10-CM

## 2021-09-08 DIAGNOSIS — M25512 Pain in left shoulder: Secondary | ICD-10-CM

## 2021-09-08 DIAGNOSIS — I6990 Unspecified sequelae of unspecified cerebrovascular disease: Secondary | ICD-10-CM

## 2021-09-08 DIAGNOSIS — M25572 Pain in left ankle and joints of left foot: Secondary | ICD-10-CM

## 2021-09-08 DIAGNOSIS — R278 Other lack of coordination: Secondary | ICD-10-CM

## 2021-09-08 DIAGNOSIS — M6281 Muscle weakness (generalized): Secondary | ICD-10-CM

## 2021-09-08 DIAGNOSIS — E118 Type 2 diabetes mellitus with unspecified complications: Secondary | ICD-10-CM

## 2021-09-08 DIAGNOSIS — I69354 Hemiplegia and hemiparesis following cerebral infarction affecting left non-dominant side: Secondary | ICD-10-CM | POA: Diagnosis not present

## 2021-09-08 NOTE — Therapy (Signed)
[place] [place], [place], [address] Phone: [phone]   Fax:  [phone]  Physical Therapy Treatment  Patient Details  Name: Karen Jackson MRN: [id number] Date of Birth: Jun 08, 1941 Referring Provider (PT): Hensel   Encounter Date: 09/08/2021   PT End of Session - 09/08/21 1105     Visit Number 9    Date for PT Re-Evaluation 11/01/21    Authorization Type Medicare    PT Start Time 1008    PT Stop Time 1054    PT Time Calculation (min) 46 min    Activity Tolerance Patient tolerated treatment well;Patient limited by fatigue    Behavior During Therapy [facility] for tasks assessed/performed             Past Medical History:  Diagnosis Date   Acute cystitis without hematuria    Acute diastolic CHF (congestive heart failure) (HCC)    Arthritis    Dyspnea    Dysrhythmia    Fever of unknown origin 03/19/2017   Hyperlipidemia    Hypertension    denies at preop   Multifocal pneumonia    Neuromuscular disorder ([place])    neuropathy left arm and foot   Osteopenia    Paralysis ([place])    partial left side from CVA    Persistent atrial fibrillation (HCC)    PONV (postoperative nausea and vomiting)    Pre-diabetes    Stroke [facility]) 2013   hemmorahgic    Past Surgical History:  Procedure Laterality Date   ANKLE SURGERY     APPENDECTOMY     CHOLECYSTECTOMY     HERNIA REPAIR     Esophagus   JOINT REPLACEMENT     total- right partial- left   MASTECTOMY PARTIAL / LUMPECTOMY  2012   left   ORIF ANKLE FRACTURE Left 07/20/2018   Procedure: OPEN REDUCTION INTERNAL FIXATION (ORIF) ANKLE FRACTURE;  Surgeon: Wylene Simmer, MD;  Location: [place];  Service: Orthopedics;  Laterality: Left;   TOTAL KNEE ARTHROPLASTY Left 01/27/2019   Procedure: TOTAL KNEE ARTHROPLASTY;  Surgeon: Vickey Huger, MD;  [facility];  Service: Orthopedics;  Laterality: Left;    There were no vitals filed for this visit.   Subjective  Assessment - 09/08/21 1015     Subjective A little pain in the left shoulder and ankle, feeling better with the BP medication, no falls    Currently in Pain? No/denies                [facility] PT Assessment - 09/08/21 0001       Timed Up and Go Test   Normal TUG (seconds) 57    TUG Comments some struggling getting up and then unsafe with turning to sit, gets halfway turned and then plops down into the chair                           [facility] Adult PT Treatment/Exercise - 09/08/21 0001       Ambulation/Gait   Gait Comments FWW 100' x 2, 50 ' x 2      Knee/Hip Exercises: Aerobic   Nustep level 5 x 8 minutes      Knee/Hip Exercises: Seated   Long Arc Quad Both;2 sets;10 reps    Long Arc Quad Weight 5 lbs.    Other Seated Knee/Hip Exercises red tband bilateral ankle PF/DF    Marching Both;2 sets;10 reps    [employer]  5 lbs.                       PT Short Term Goals - 08/18/21 1155       PT SHORT TERM GOAL #1   Status Partially Met               PT Long Term Goals - 09/08/21 1108       PT LONG TERM GOAL #1   Title walk with FWW x 150 feet without rest with supervision only    Status On-going                   Plan - 09/08/21 1106     Clinical Impression Statement Patient had difficulty with the first TUG I attempted today, she continued to try to pull up on the walker, she struggled with this, she then when she got back to the chair did not turn all the way and fell into the chair without any safety thoughts.  I talked with her about this and how unsafe she was, and that I may expect it if she was tired but this was a total of 20 feet and the first thing we did, her second attempt was much better but she still tends to be unsafe when turning to sit, by plopping down.  Her breathing remains an issue and she fatigues at 75 feet requiring a short standing break and then can go further, she is short of breath but O2 sats are  96%+    PT Next Visit Plan work on safety and core    Consulted and Agree with Plan of Care Patient             Patient will benefit from skilled therapeutic intervention in order to improve the following deficits and impairments:  Abnormal gait, Decreased range of motion, Difficulty walking, Impaired UE functional use, Increased muscle spasms, Cardiopulmonary status limiting activity, Decreased endurance, Decreased activity tolerance, Pain, Decreased balance, Decreased mobility, Decreased strength  Visit Diagnosis: Ataxia  Hemiplegia and hemiparesis following cerebral infarction affecting left non-dominant side (HCC)  Muscle weakness (generalized)  Other lack of coordination  Late effects of CVA (cerebrovascular accident)  Left shoulder pain, unspecified chronicity  Pain in left ankle and joints of left foot     Problem List Patient Active Problem List   Diagnosis Date Noted   Secondary hypercoagulable state ([place]) 03/16/2021   Dizziness 03/10/2021   Orthostatic hypotension 10/15/2020   Presbycusis of both ears 03/08/2020   Mixed stress and urge urinary incontinence 12/02/2019   Macrocytosis 12/01/2019   Nutritional anemia 12/01/2019   S/P total knee replacement 01/27/2019   Recurrent left knee instability 07/05/2018   Respiratory failure with hypoxia ([place]) 08/30/2017   Hypoxemia    Heart failure with preserved ejection fraction ([place]), Grade 3 diastolic dysfunction 83/33/8329   PAF (paroxysmal atrial fibrillation) ([place])    Dyspnea 03/19/2017   Encounter for preventive health examination 02/17/2016   Sensorineural hearing loss (SNHL), bilateral 01/26/2016   Morbid obesity ([place]) 06/17/2015   Hypomagnesemia 04/24/2014   Hemiparesis affecting left side as late effect of cerebrovascular accident ([place]) 04/24/2014   Nontraumatic cerebral hemorrhage ([place]) 04/30/2012   DM (diabetes mellitus) with complications ([place]) 19/16/6060   OSTEOPENIA 01/21/2009   UNSPECIFIED  VITAMIN D DEFICIENCY 11/19/2007   HYPERCHOLESTEROLEMIA 10/25/2006   GASTROESOPHAGEAL REFLUX, NO ESOPHAGITIS 10/25/2006   DIVERTICULOSIS OF COLON 10/25/2006   Osteoarthritis 10/25/2006   CERVICAL SPINE DISORDER, NOS 10/25/2006  Sumner Boast, PT 09/08/2021, 11:09 AM  [place]. [place], [place], [address] Phone: [phone]   Fax:  [phone]  Name: AREONNA BRAN MRN: [id number] Date of Birth: [birth year]

## 2021-09-08 NOTE — Telephone Encounter (Signed)
Pt notified that someone will reach out to her regarding her PFT's, and that depending on the test if she will need a referral to pulmonary.

## 2021-09-08 NOTE — Telephone Encounter (Signed)
PT IS F/U ON REFERRAL BEING SENT TO A PULMONARY PHYSICIAN. SHE WANTS TO KNOW IF SHE NEEDS TO CALL TO MAKE AN APPT

## 2021-09-12 ENCOUNTER — Ambulatory Visit: Payer: Medicare Other | Admitting: Physical Therapy

## 2021-09-12 ENCOUNTER — Encounter: Payer: Self-pay | Admitting: Physical Therapy

## 2021-09-12 ENCOUNTER — Other Ambulatory Visit: Payer: Self-pay

## 2021-09-12 DIAGNOSIS — M6281 Muscle weakness (generalized): Secondary | ICD-10-CM

## 2021-09-12 DIAGNOSIS — R270 Ataxia, unspecified: Secondary | ICD-10-CM

## 2021-09-12 DIAGNOSIS — I6990 Unspecified sequelae of unspecified cerebrovascular disease: Secondary | ICD-10-CM | POA: Diagnosis not present

## 2021-09-12 DIAGNOSIS — R278 Other lack of coordination: Secondary | ICD-10-CM | POA: Diagnosis not present

## 2021-09-12 DIAGNOSIS — I69354 Hemiplegia and hemiparesis following cerebral infarction affecting left non-dominant side: Secondary | ICD-10-CM | POA: Diagnosis not present

## 2021-09-12 DIAGNOSIS — M25572 Pain in left ankle and joints of left foot: Secondary | ICD-10-CM

## 2021-09-12 DIAGNOSIS — M25512 Pain in left shoulder: Secondary | ICD-10-CM | POA: Diagnosis not present

## 2021-09-12 NOTE — Therapy (Signed)
[place]. [place], [place], [address] Phone: [phone]   Fax:  [phone]  Physical Therapy Treatment  Patient Details  Name: Karen Jackson MRN: [id number] Date of Birth: 17-May-1941 Referring Provider (PT): Hensel   Encounter Date: 09/12/2021   PT End of Session - 09/12/21 1147     Visit Number 10    Date for PT Re-Evaluation 11/01/21    Authorization Type Medicare    PT Start Time 1056    PT Stop Time 1145    PT Time Calculation (min) 49 min             Past Medical History:  Diagnosis Date   Acute cystitis without hematuria    Acute diastolic CHF (congestive heart failure) ([place])    Arthritis    Dyspnea    Dysrhythmia    Fever of unknown origin 03/19/2017   Hyperlipidemia    Hypertension    denies at preop   Multifocal pneumonia    Neuromuscular disorder ([place])    neuropathy left arm and foot   Osteopenia    Paralysis ([place])    partial left side from CVA    Persistent atrial fibrillation (HCC)    PONV (postoperative nausea and vomiting)    Pre-diabetes    Stroke [facility]) 2013   hemmorahgic    Past Surgical History:  Procedure Laterality Date   ANKLE SURGERY     APPENDECTOMY     CHOLECYSTECTOMY     HERNIA REPAIR     Esophagus   JOINT REPLACEMENT     total- right partial- left   MASTECTOMY PARTIAL / LUMPECTOMY  2012   left   ORIF ANKLE FRACTURE Left 07/20/2018   Procedure: OPEN REDUCTION INTERNAL FIXATION (ORIF) ANKLE FRACTURE;  Surgeon: Wylene Simmer, MD;  Location: [place];  Service: Orthopedics;  Laterality: Left;   TOTAL KNEE ARTHROPLASTY Left 01/27/2019   Procedure: TOTAL KNEE ARTHROPLASTY;  Surgeon: Vickey Huger, MD;  [facility];  Service: Orthopedics;  Laterality: Left;    There were no vitals filed for this visit.   Subjective Assessment - 09/12/21 1057     Subjective "Good, shoulder hurts but other than that I am ok"    Currently in Pain? Yes    Pain Score 7     Pain  Location Shoulder    Pain Orientation Left                               [facility] Adult PT Treatment/Exercise - 09/12/21 0001       Ambulation/Gait   Gait Comments FWW 100' x 2      Knee/Hip Exercises: Aerobic   Nustep level 5 x 7 minutes   500 steps     Knee/Hip Exercises: Standing   Other Standing Knee Exercises 4" toe touches alternating      Knee/Hip Exercises: Seated   Long Arc Quad Both;2 sets;10 reps    Long Arc Quad Weight 5 lbs.    Ball Squeeze 2x10    Other Seated Knee/Hip Exercises red tband bilateral ankle PF/DF    Marching Both;2 sets;10 reps    Marching Weights 5 lbs.      Manual Therapy   Manual therapy comments STM to the left upper trap    Passive ROM left ankle for pain  PT Short Term Goals - 08/18/21 1155       PT SHORT TERM GOAL #1   Status Partially Met               PT Long Term Goals - 09/08/21 1108       PT LONG TERM GOAL #1   Title walk with FWW x 150 feet without rest with supervision only    Status On-going                   Plan - 09/12/21 1150     Clinical Impression Statement Increase in fatigue noted with all activity despite O2 > 96% during session. Pt tends to be unsafe when sitting form standing not turing around completely before sitting. Mod verbal and tactile cues give to turn completely before sitting. Standing rest needed after ~ 70 feet in each 116f gait trial.    PT Duration 12 weeks    PT Treatment/Interventions ADLs/Self Care Home Management;Electrical Stimulation;Moist Heat;Gait training;Functional mobility training;Therapeutic activities;Therapeutic exercise;Balance training;Neuromuscular re-education;Patient/family education;Manual techniques    PT Next Visit Plan work on safety and core             Patient will benefit from skilled therapeutic intervention in order to improve the following deficits and impairments:  Abnormal gait, Decreased  range of motion, Difficulty walking, Impaired UE functional use, Increased muscle spasms, Cardiopulmonary status limiting activity, Decreased endurance, Decreased activity tolerance, Pain, Decreased balance, Decreased mobility, Decreased strength  Visit Diagnosis: Ataxia  Muscle weakness (generalized)  Hemiplegia and hemiparesis following cerebral infarction affecting left non-dominant side (HCC)  Pain in left ankle and joints of left foot     Problem List Patient Active Problem List   Diagnosis Date Noted   Secondary hypercoagulable state ([place] 03/16/2021   Dizziness 03/10/2021   Orthostatic hypotension 10/15/2020   Presbycusis of both ears 03/08/2020   Mixed stress and urge urinary incontinence 12/02/2019   Macrocytosis 12/01/2019   Nutritional anemia 12/01/2019   S/P total knee replacement 01/27/2019   Recurrent left knee instability 07/05/2018   Respiratory failure with hypoxia ([place] 08/30/2017   Hypoxemia    Heart failure with preserved ejection fraction ([place], Grade 3 diastolic dysfunction 048/08/6551  PAF (paroxysmal atrial fibrillation) ([place]    Dyspnea 03/19/2017   Encounter for preventive health examination 02/17/2016   Sensorineural hearing loss (SNHL), bilateral 01/26/2016   Morbid obesity ([place] 06/17/2015   Hypomagnesemia 04/24/2014   Hemiparesis affecting left side as late effect of cerebrovascular accident ([place] 04/24/2014   Nontraumatic cerebral hemorrhage ([place] 04/30/2012   DM (diabetes mellitus) with complications ([place] 074/82/7078  OSTEOPENIA 01/21/2009   UNSPECIFIED VITAMIN D DEFICIENCY 11/19/2007   HYPERCHOLESTEROLEMIA 10/25/2006   GASTROESOPHAGEAL REFLUX, NO ESOPHAGITIS 10/25/2006   DIVERTICULOSIS OF COLON 10/25/2006   Osteoarthritis 10/25/2006   CERVICAL SPINE DISORDER, NOS 10/25/2006    RScot Jun PTA 09/12/2021, 11:52 AM  [place] [address]Phone:  [phone]  Fax:  [phone] Name: Karen HARTZELLMRN: [id number] of Birth: [birth year]

## 2021-09-15 ENCOUNTER — Other Ambulatory Visit: Payer: Self-pay

## 2021-09-15 ENCOUNTER — Ambulatory Visit: Payer: Medicare Other | Admitting: Physical Therapy

## 2021-09-15 ENCOUNTER — Other Ambulatory Visit: Payer: Self-pay | Admitting: Family Medicine

## 2021-09-15 DIAGNOSIS — I6990 Unspecified sequelae of unspecified cerebrovascular disease: Secondary | ICD-10-CM | POA: Diagnosis not present

## 2021-09-15 DIAGNOSIS — M25572 Pain in left ankle and joints of left foot: Secondary | ICD-10-CM

## 2021-09-15 DIAGNOSIS — M6281 Muscle weakness (generalized): Secondary | ICD-10-CM | POA: Diagnosis not present

## 2021-09-15 DIAGNOSIS — M25512 Pain in left shoulder: Secondary | ICD-10-CM | POA: Diagnosis not present

## 2021-09-15 DIAGNOSIS — K219 Gastro-esophageal reflux disease without esophagitis: Secondary | ICD-10-CM

## 2021-09-15 DIAGNOSIS — I69354 Hemiplegia and hemiparesis following cerebral infarction affecting left non-dominant side: Secondary | ICD-10-CM

## 2021-09-15 DIAGNOSIS — R270 Ataxia, unspecified: Secondary | ICD-10-CM | POA: Diagnosis not present

## 2021-09-15 DIAGNOSIS — R278 Other lack of coordination: Secondary | ICD-10-CM | POA: Diagnosis not present

## 2021-09-15 DIAGNOSIS — I5030 Unspecified diastolic (congestive) heart failure: Secondary | ICD-10-CM

## 2021-09-15 NOTE — Therapy (Signed)
[place]. [place], [place], [address] Phone: [phone]   Fax:  [phone]  Physical Therapy Treatment  Patient Details  Name: Karen Jackson MRN: [id number] Date of Birth: 08-16-1941 Referring Provider (PT): Hensel   Encounter Date: 09/15/2021   PT End of Session - 09/15/21 1144     Visit Number 11    Date for PT Re-Evaluation 11/01/21    Authorization Type Medicare    PT Start Time 1055    PT Stop Time 1007    PT Time Calculation (min) 50 min             Past Medical History:  Diagnosis Date   Acute cystitis without hematuria    Acute diastolic CHF (congestive heart failure) ([place])    Arthritis    Dyspnea    Dysrhythmia    Fever of unknown origin 03/19/2017   Hyperlipidemia    Hypertension    denies at preop   Multifocal pneumonia    Neuromuscular disorder ([place])    neuropathy left arm and foot   Osteopenia    Paralysis ([place])    partial left side from CVA    Persistent atrial fibrillation (HCC)    PONV (postoperative nausea and vomiting)    Pre-diabetes    Stroke [facility]) 2013   hemmorahgic    Past Surgical History:  Procedure Laterality Date   ANKLE SURGERY     APPENDECTOMY     CHOLECYSTECTOMY     HERNIA REPAIR     Esophagus   JOINT REPLACEMENT     total- right partial- left   MASTECTOMY PARTIAL / LUMPECTOMY  2012   left   ORIF ANKLE FRACTURE Left 07/20/2018   Procedure: OPEN REDUCTION INTERNAL FIXATION (ORIF) ANKLE FRACTURE;  Surgeon: Wylene Simmer, MD;  Location: [place];  Service: Orthopedics;  Laterality: Left;   TOTAL KNEE ARTHROPLASTY Left 01/27/2019   Procedure: TOTAL KNEE ARTHROPLASTY;  Surgeon: Vickey Huger, MD;  [facility];  Service: Orthopedics;  Laterality: Left;    There were no vitals filed for this visit.   Subjective Assessment - 09/15/21 1100     Subjective doing okay, shlds hurt but they always hurt Rt 4/10, left 7/10                                [facility] Adult PT Treatment/Exercise - 09/15/21 0001       Ambulation/Gait   Gait Comments FWW 100' x 2   amb in from car to [employer] - did well but more nervous as she did not feel warmed up and this was different from her normal     Knee/Hip Exercises: Aerobic   Nustep level 5 x 7 minutes      Knee/Hip Exercises: Seated   Long Arc Quad Strengthening;Both;2 sets;10 reps;Weights    Long Arc Quad Weight 5 lbs.    Other Seated Knee/Hip Exercises trunk ext 2 sets 10   blue tbnad   Marching Both;2 sets;10 reps    Marching Weights 5 lbs.    Hamstring Curl Right;Strengthening;Left;2 sets;10 reps   green tband   Abduction/Adduction  Strengthening;Both;2 sets;10 reps;Weights    Abd/Adduction Weights 5 lbs.    Sit to Sand 2 sets;5 reps;without UE support   from mat     Manual Therapy   Manual therapy comments STM to the left upper trap   approximation to left hand  PT Short Term Goals - 09/15/21 1145       PT SHORT TERM GOAL #1   Title independent with initial HEP    Status Partially Met               PT Long Term Goals - 09/15/21 1146       PT LONG TERM GOAL #1   Title walk with FWW x 150 feet without rest with supervision only    Status On-going      PT LONG TERM GOAL #2   Title increase left knee strength to 4/5                   Plan - 09/15/21 1144     Clinical Impression Statement did all sitting ex on the mat to activate core. amb in from car and pt very nervous crossing thresholds. cuing to not compensate with ex    PT Treatment/Interventions ADLs/Self Care Home Management;Electrical Stimulation;Moist Heat;Gait training;Functional mobility training;Therapeutic activities;Therapeutic exercise;Balance training;Neuromuscular re-education;Patient/family education;Manual techniques    PT Next Visit Plan work on safety and core             Patient will benefit from skilled therapeutic  intervention in order to improve the following deficits and impairments:  Abnormal gait, Decreased range of motion, Difficulty walking, Impaired UE functional use, Increased muscle spasms, Cardiopulmonary status limiting activity, Decreased endurance, Decreased activity tolerance, Pain, Decreased balance, Decreased mobility, Decreased strength  Visit Diagnosis: Muscle weakness (generalized)  Ataxia  Hemiplegia and hemiparesis following cerebral infarction affecting left non-dominant side (HCC)  Pain in left ankle and joints of left foot     Problem List Patient Active Problem List   Diagnosis Date Noted   Secondary hypercoagulable state ([place]) 03/16/2021   Dizziness 03/10/2021   Orthostatic hypotension 10/15/2020   Presbycusis of both ears 03/08/2020   Mixed stress and urge urinary incontinence 12/02/2019   Macrocytosis 12/01/2019   Nutritional anemia 12/01/2019   S/P total knee replacement 01/27/2019   Recurrent left knee instability 07/05/2018   Respiratory failure with hypoxia ([place]) 08/30/2017   Hypoxemia    Heart failure with preserved ejection fraction ([place]), Grade 3 diastolic dysfunction 15/52/0802   PAF (paroxysmal atrial fibrillation) ([place])    Dyspnea 03/19/2017   Encounter for preventive health examination 02/17/2016   Sensorineural hearing loss (SNHL), bilateral 01/26/2016   Morbid obesity ([place]) 06/17/2015   Hypomagnesemia 04/24/2014   Hemiparesis affecting left side as late effect of cerebrovascular accident ([place]) 04/24/2014   Nontraumatic cerebral hemorrhage ([place]) 04/30/2012   DM (diabetes mellitus) with complications ([place]) 23/36/1224   OSTEOPENIA 01/21/2009   UNSPECIFIED VITAMIN D DEFICIENCY 11/19/2007   HYPERCHOLESTEROLEMIA 10/25/2006   GASTROESOPHAGEAL REFLUX, NO ESOPHAGITIS 10/25/2006   DIVERTICULOSIS OF COLON 10/25/2006   Osteoarthritis 10/25/2006   CERVICAL SPINE DISORDER, NOS 10/25/2006    ,ANGIE, PTA 09/15/2021, 11:47 AM  [place]. [place], [place], [address] Phone: [phone]   Fax:  [phone]  Name: SHARENA DIBENEDETTO MRN: [id number] Date of Birth: [birth year]

## 2021-09-16 ENCOUNTER — Other Ambulatory Visit: Payer: Self-pay | Admitting: Family Medicine

## 2021-09-16 DIAGNOSIS — E7800 Pure hypercholesterolemia, unspecified: Secondary | ICD-10-CM

## 2021-09-19 ENCOUNTER — Encounter: Payer: Self-pay | Admitting: Physical Therapy

## 2021-09-19 ENCOUNTER — Other Ambulatory Visit: Payer: Self-pay

## 2021-09-19 ENCOUNTER — Ambulatory Visit: Payer: Medicare Other | Admitting: Physical Therapy

## 2021-09-19 DIAGNOSIS — R270 Ataxia, unspecified: Secondary | ICD-10-CM | POA: Diagnosis not present

## 2021-09-19 DIAGNOSIS — M25572 Pain in left ankle and joints of left foot: Secondary | ICD-10-CM

## 2021-09-19 DIAGNOSIS — I69354 Hemiplegia and hemiparesis following cerebral infarction affecting left non-dominant side: Secondary | ICD-10-CM | POA: Diagnosis not present

## 2021-09-19 DIAGNOSIS — M6281 Muscle weakness (generalized): Secondary | ICD-10-CM | POA: Diagnosis not present

## 2021-09-19 DIAGNOSIS — R293 Abnormal posture: Secondary | ICD-10-CM

## 2021-09-19 DIAGNOSIS — R278 Other lack of coordination: Secondary | ICD-10-CM

## 2021-09-19 DIAGNOSIS — M25512 Pain in left shoulder: Secondary | ICD-10-CM | POA: Diagnosis not present

## 2021-09-19 DIAGNOSIS — I6990 Unspecified sequelae of unspecified cerebrovascular disease: Secondary | ICD-10-CM | POA: Diagnosis not present

## 2021-09-19 NOTE — Therapy (Signed)
[place]. [place], [place], [address] Phone: [phone]   Fax:  [phone]  Physical Therapy Treatment  Patient Details  Name: Karen Jackson MRN: [id number] Date of Birth: December 17, 1940 Referring Provider (PT): Hensel   Encounter Date: 09/19/2021   PT End of Session - 09/19/21 1144     Visit Number 12    Date for PT Re-Evaluation 11/01/21    Authorization Type Medicare    PT Start Time 1055    PT Stop Time 1141    PT Time Calculation (min) 46 min    Activity Tolerance Patient tolerated treatment well;Patient limited by fatigue    Behavior During Therapy [facility] for tasks assessed/performed             Past Medical History:  Diagnosis Date   Acute cystitis without hematuria    Acute diastolic CHF (congestive heart failure) (HCC)    Arthritis    Dyspnea    Dysrhythmia    Fever of unknown origin 03/19/2017   Hyperlipidemia    Hypertension    denies at preop   Multifocal pneumonia    Neuromuscular disorder ([place])    neuropathy left arm and foot   Osteopenia    Paralysis ([place])    partial left side from CVA    Persistent atrial fibrillation (HCC)    PONV (postoperative nausea and vomiting)    Pre-diabetes    Stroke [facility]) 2013   hemmorahgic    Past Surgical History:  Procedure Laterality Date   ANKLE SURGERY     APPENDECTOMY     CHOLECYSTECTOMY     HERNIA REPAIR     Esophagus   JOINT REPLACEMENT     total- right partial- left   MASTECTOMY PARTIAL / LUMPECTOMY  2012   left   ORIF ANKLE FRACTURE Left 07/20/2018   Procedure: OPEN REDUCTION INTERNAL FIXATION (ORIF) ANKLE FRACTURE;  Surgeon: Wylene Simmer, MD;  Location: [facility];  Service: Orthopedics;  Laterality: Left;   TOTAL KNEE ARTHROPLASTY Left 01/27/2019   Procedure: TOTAL KNEE ARTHROPLASTY;  Surgeon: Vickey Huger, MD;  Location[facility];  Service: Orthopedics;  Laterality: Left;    There were no vitals filed for this visit.   Subjective  Assessment - 09/19/21 1111     Subjective Doing okay, not walking well today, just sit around this morning    Currently in Pain? Yes    Pain Score 5     Pain Location Shoulder    Pain Orientation Left    Pain Descriptors / Indicators Sore;Spasm    Aggravating Factors  walking                               [facility] Adult PT Treatment/Exercise - 09/19/21 0001       Ambulation/Gait   Gait Comments FWW CGA 2x 120', cues for the transfer as she continues to be unsafe and plops down and does not reach back.      High Level Balance   High Level Balance Comments 2" and 4" toe taps standing using hte walker, reaching activities to challenge her balance      Knee/Hip Exercises: Seated   Long Arc Quad Strengthening;Both;2 sets;10 reps;Weights    Long Arc Quad Weight 5 lbs.    Other Seated Knee/Hip Exercises trunk ext 2 sets 10, red tband left ankle  exercises    Other Seated Knee/Hip Exercises worked on left hip  adduction, really tried to isolate this due to weakness with red tband    Marching Both;2 sets;10 reps    Marching Weights 5 lbs.                       PT Short Term Goals - 09/15/21 1145       PT SHORT TERM GOAL #1   Title independent with initial HEP    Status Partially Met               PT Long Term Goals - 09/15/21 1146       PT LONG TERM GOAL #1   Title walk with FWW x 150 feet without rest with supervision only    Status On-going      PT LONG TERM GOAL #2   Title increase left knee strength to 4/5                   Plan - 09/19/21 1146     Clinical Impression Statement Patient reports that she did a lot of sitting over the weekend, reports stiff and sore especially the left shoulder and ankle.  She does have a lot of knots in the shoulder, she was able to do the walking a little further distance, her biggest issue is being unsafe with the transfers, not reaching back and plopping with cues she will do better., but  her chair slides easily due to it being a transport chair and this makes what she does very unsafe    PT Next Visit Plan work on safety and core    Consulted and Agree with Plan of Care Patient             Patient will benefit from skilled therapeutic intervention in order to improve the following deficits and impairments:  Abnormal gait, Decreased range of motion, Difficulty walking, Impaired UE functional use, Increased muscle spasms, Cardiopulmonary status limiting activity, Decreased endurance, Decreased activity tolerance, Pain, Decreased balance, Decreased mobility, Decreased strength  Visit Diagnosis: Muscle weakness (generalized)  Ataxia  Hemiplegia and hemiparesis following cerebral infarction affecting left non-dominant side (HCC)  Pain in left ankle and joints of left foot  Other lack of coordination  Late effects of CVA (cerebrovascular accident)  Abnormal posture     Problem List Patient Active Problem List   Diagnosis Date Noted   Secondary hypercoagulable state ([place]) 03/16/2021   Dizziness 03/10/2021   Orthostatic hypotension 10/15/2020   Presbycusis of both ears 03/08/2020   Mixed stress and urge urinary incontinence 12/02/2019   Macrocytosis 12/01/2019   Nutritional anemia 12/01/2019   S/P total knee replacement 01/27/2019   Recurrent left knee instability 07/05/2018   Respiratory failure with hypoxia ([place]) 08/30/2017   Hypoxemia    Heart failure with preserved ejection fraction (HCC), Grade 3 diastolic dysfunction 62/13/0865   PAF (paroxysmal atrial fibrillation) ([place])    Dyspnea 03/19/2017   Encounter for preventive health examination 02/17/2016   Sensorineural hearing loss (SNHL), bilateral 01/26/2016   Morbid obesity ([place]) 06/17/2015   Hypomagnesemia 04/24/2014   Hemiparesis affecting left side as late effect of cerebrovascular accident ([place]) 04/24/2014   Nontraumatic cerebral hemorrhage ([place]) 04/30/2012   DM (diabetes mellitus) with  complications ([place]) 78/46/9629   OSTEOPENIA 01/21/2009   UNSPECIFIED VITAMIN D DEFICIENCY 11/19/2007   HYPERCHOLESTEROLEMIA 10/25/2006   GASTROESOPHAGEAL REFLUX, NO ESOPHAGITIS 10/25/2006   DIVERTICULOSIS OF COLON 10/25/2006   Osteoarthritis 10/25/2006   CERVICAL SPINE DISORDER, NOS 10/25/2006    Sumner Boast,  PT 09/19/2021, 11:48 AM  [place]. [place], [place], [address] Phone: [phone]   Fax:  [phone]  Name: Karen Jackson MRN: [id number] Date of Birth: [birth year]

## 2021-09-22 ENCOUNTER — Ambulatory Visit: Payer: Medicare Other | Admitting: Physical Therapy

## 2021-09-22 ENCOUNTER — Other Ambulatory Visit: Payer: Self-pay

## 2021-09-22 ENCOUNTER — Encounter: Payer: Self-pay | Admitting: Physical Therapy

## 2021-09-22 DIAGNOSIS — I6990 Unspecified sequelae of unspecified cerebrovascular disease: Secondary | ICD-10-CM | POA: Diagnosis not present

## 2021-09-22 DIAGNOSIS — R270 Ataxia, unspecified: Secondary | ICD-10-CM | POA: Diagnosis not present

## 2021-09-22 DIAGNOSIS — M6281 Muscle weakness (generalized): Secondary | ICD-10-CM

## 2021-09-22 DIAGNOSIS — M25512 Pain in left shoulder: Secondary | ICD-10-CM

## 2021-09-22 DIAGNOSIS — R278 Other lack of coordination: Secondary | ICD-10-CM

## 2021-09-22 DIAGNOSIS — R262 Difficulty in walking, not elsewhere classified: Secondary | ICD-10-CM

## 2021-09-22 DIAGNOSIS — I69354 Hemiplegia and hemiparesis following cerebral infarction affecting left non-dominant side: Secondary | ICD-10-CM | POA: Diagnosis not present

## 2021-09-22 DIAGNOSIS — M25572 Pain in left ankle and joints of left foot: Secondary | ICD-10-CM

## 2021-09-22 NOTE — Therapy (Signed)
[place]. [place], [place], [address] Phone: [phone]   Fax:  [phone]  Physical Therapy Treatment  Patient Details  Name: Karen Jackson MRN: [id number] Date of Birth: [birth year] Referring Provider (PT): Hensel   Encounter Date: 09/22/2021   PT End of Session - 09/22/21 1106     Visit Number 13    Date for PT Re-Evaluation 11/01/21    Authorization Type Medicare    PT Start Time 1018    PT Stop Time 1103    PT Time Calculation (min) 45 min    Activity Tolerance Patient tolerated treatment well;Patient limited by fatigue    Behavior During Therapy [facility] for tasks assessed/performed             Past Medical History:  Diagnosis Date   Acute cystitis without hematuria    Acute diastolic CHF (congestive heart failure) (HCC)    Arthritis    Dyspnea    Dysrhythmia    Fever of unknown origin 03/19/2017   Hyperlipidemia    Hypertension    denies at preop   Multifocal pneumonia    Neuromuscular disorder ([place])    neuropathy left arm and foot   Osteopenia    Paralysis ([place])    partial left side from CVA    Persistent atrial fibrillation (HCC)    PONV (postoperative nausea and vomiting)    Pre-diabetes    Stroke [facility]) 2013   hemmorahgic    Past Surgical History:  Procedure Laterality Date   ANKLE SURGERY     APPENDECTOMY     CHOLECYSTECTOMY     HERNIA REPAIR     Esophagus   JOINT REPLACEMENT     total- right partial- left   MASTECTOMY PARTIAL / LUMPECTOMY  2012   left   ORIF ANKLE FRACTURE Left 07/20/2018   Procedure: OPEN REDUCTION INTERNAL FIXATION (ORIF) ANKLE FRACTURE;  Surgeon: Wylene Simmer, MD;  Location: [facility];  Service: Orthopedics;  Laterality: Left;   TOTAL KNEE ARTHROPLASTY Left 01/27/2019   Procedure: TOTAL KNEE ARTHROPLASTY;  Surgeon: Vickey Huger, MD;  [facility];  Service: Orthopedics;  Laterality: Left;    There were no vitals filed for this visit.   Subjective  Assessment - 09/22/21 1026     Subjective I was tiredbut not sore    Currently in Pain? Yes    Pain Score 4     Pain Location Ankle    Pain Orientation Left    Aggravating Factors  walking makes it sore                               [facility] Adult PT Treatment/Exercise - 09/22/21 0001       Ambulation/Gait   Gait Comments FWW CGA 110'x1 and then 75' x 1      Knee/Hip Exercises: Aerobic   Nustep level 5 x 7 minutes      Knee/Hip Exercises: Supine   Quad Sets Both;2 sets;10 reps    Quad Sets Limitations 5#    Bridges with [employer] 10 reps;2 sets    Bridges with [employer] 10 reps;2 sets    Straight Leg Raises Left;3 sets;10 reps    Other Supine Knee/Hip Exercises chest press 3# stick    Other Supine Knee/Hip Exercises isometic abs      Knee/Hip Exercises: Sidelying   Hip ABduction Left;10 reps    Clams left 3x 8  PT Short Term Goals - 09/15/21 1145       PT SHORT TERM GOAL #1   Title independent with initial HEP    Status Partially Met               PT Long Term Goals - 09/22/21 1109       PT LONG TERM GOAL #1   Title walk with FWW x 150 feet without rest with supervision only    Status On-going      PT LONG TERM GOAL #2   Title increase left knee strength to 4/5    Status On-going                   Plan - 09/22/21 1107     Clinical Impression Statement I focused more on the left LE and her core.  She has a lot of difficulty with the stability of th eleft hip.  We worked more on this  She reports that she feels like she is having less difficulty breathing.  I feel that with all of the education on the safety and how unsafe and impulsive she has been this is finally starting to get through as she is doing much better with less cues    PT Next Visit Plan work on safety and core    Consulted and Agree with Plan of Care Patient             Patient will benefit from skilled therapeutic  intervention in order to improve the following deficits and impairments:  Abnormal gait, Decreased range of motion, Difficulty walking, Impaired UE functional use, Increased muscle spasms, Cardiopulmonary status limiting activity, Decreased endurance, Decreased activity tolerance, Pain, Decreased balance, Decreased mobility, Decreased strength  Visit Diagnosis: Muscle weakness (generalized)  Hemiplegia and hemiparesis following cerebral infarction affecting left non-dominant side (HCC)  Ataxia  Pain in left ankle and joints of left foot  Other lack of coordination  Late effects of CVA (cerebrovascular accident)  Left shoulder pain, unspecified chronicity  Difficulty in walking, not elsewhere classified     Problem List Patient Active Problem List   Diagnosis Date Noted   Secondary hypercoagulable state ([place]) 03/16/2021   Dizziness 03/10/2021   Orthostatic hypotension 10/15/2020   Presbycusis of both ears 03/08/2020   Mixed stress and urge urinary incontinence 12/02/2019   Macrocytosis 12/01/2019   Nutritional anemia 12/01/2019   S/P total knee replacement 01/27/2019   Recurrent left knee instability 07/05/2018   Respiratory failure with hypoxia ([place]) 08/30/2017   Hypoxemia    Heart failure with preserved ejection fraction (HCC), Grade 3 diastolic dysfunction 26/20/3559   PAF (paroxysmal atrial fibrillation) ([place])    Dyspnea 03/19/2017   Encounter for preventive health examination 02/17/2016   Sensorineural hearing loss (SNHL), bilateral 01/26/2016   Morbid obesity ([place]) 06/17/2015   Hypomagnesemia 04/24/2014   Hemiparesis affecting left side as late effect of cerebrovascular accident ([place]) 04/24/2014   Nontraumatic cerebral hemorrhage ([place]) 04/30/2012   DM (diabetes mellitus) with complications ([place]) 74/16/3845   OSTEOPENIA 01/21/2009   UNSPECIFIED VITAMIN D DEFICIENCY 11/19/2007   HYPERCHOLESTEROLEMIA 10/25/2006   GASTROESOPHAGEAL REFLUX, NO ESOPHAGITIS 10/25/2006    DIVERTICULOSIS OF COLON 10/25/2006   Osteoarthritis 10/25/2006   CERVICAL SPINE DISORDER, NOS 10/25/2006    Sumner Boast, PT 09/22/2021, 11:09 AM  [place]. [place], [place], [address] Phone: [phone]   Fax:  [phone]  Name: HAILEYANN STAIGER MRN: [id number] Date of Birth: 04-18-1941

## 2021-09-23 ENCOUNTER — Other Ambulatory Visit: Payer: Self-pay | Admitting: Family Medicine

## 2021-09-26 ENCOUNTER — Other Ambulatory Visit: Payer: Self-pay

## 2021-09-26 ENCOUNTER — Encounter: Payer: Self-pay | Admitting: Physical Therapy

## 2021-09-26 ENCOUNTER — Telehealth: Payer: Self-pay | Admitting: *Deleted

## 2021-09-26 ENCOUNTER — Ambulatory Visit: Payer: Medicare Other | Admitting: Physical Therapy

## 2021-09-26 DIAGNOSIS — I69354 Hemiplegia and hemiparesis following cerebral infarction affecting left non-dominant side: Secondary | ICD-10-CM | POA: Diagnosis not present

## 2021-09-26 DIAGNOSIS — R270 Ataxia, unspecified: Secondary | ICD-10-CM

## 2021-09-26 DIAGNOSIS — M25572 Pain in left ankle and joints of left foot: Secondary | ICD-10-CM

## 2021-09-26 DIAGNOSIS — R278 Other lack of coordination: Secondary | ICD-10-CM | POA: Diagnosis not present

## 2021-09-26 DIAGNOSIS — R262 Difficulty in walking, not elsewhere classified: Secondary | ICD-10-CM

## 2021-09-26 DIAGNOSIS — M25512 Pain in left shoulder: Secondary | ICD-10-CM

## 2021-09-26 DIAGNOSIS — I6990 Unspecified sequelae of unspecified cerebrovascular disease: Secondary | ICD-10-CM | POA: Diagnosis not present

## 2021-09-26 DIAGNOSIS — M6281 Muscle weakness (generalized): Secondary | ICD-10-CM | POA: Diagnosis not present

## 2021-09-26 NOTE — Telephone Encounter (Signed)
Returned pt phone call  Left message informing pt that she will receive a phone call from office once medication is ready for pickup. Also mentioned that novo nordisk shipments are about 30+ days out now due to supply & shipment delays.   Gave call back number [phone]

## 2021-09-26 NOTE — Telephone Encounter (Signed)
Pt called in stating that she got a letter saying her ozempic got ship to [place] from the pharmacy. Checked the refrigerator and not in there. Please advise.  Kennon Holter, CMA

## 2021-09-26 NOTE — Therapy (Signed)
[place] °[facility] °[address] °[place], [place], [address] °Phone: [phone]   Fax:  [phone] ° °Physical Therapy Treatment ° °Patient Details  °Name: Karen Jackson °MRN: [id number] °Date of Birth: 05/15/1941 °Referring Provider (PT): Hensel ° ° °Encounter Date: 09/26/2021 ° ° PT End of Session - 09/26/21 1133   ° ° Visit Number 14   ° Date for PT Re-Evaluation 11/01/21   ° Authorization Type Medicare   ° PT Start Time 1057   ° PT Stop Time 1140   ° PT Time Calculation (min) 43 min   ° Activity Tolerance Patient tolerated treatment well;Patient limited by fatigue   ° Behavior During Therapy [facility] for tasks assessed/performed   ° °  °  ° °  ° ° °Past Medical History:  °Diagnosis Date  ° Acute cystitis without hematuria   ° Acute diastolic CHF (congestive heart failure) (HCC)   ° Arthritis   ° Dyspnea   ° Dysrhythmia   ° Fever of unknown origin 03/19/2017  ° Hyperlipidemia   ° Hypertension   ° denies at preop  ° Multifocal pneumonia   ° Neuromuscular disorder (HCC)   ° neuropathy left arm and foot  ° Osteopenia   ° Paralysis (HCC)   ° partial left side from CVA   ° Persistent atrial fibrillation (HCC)   ° PONV (postoperative nausea and vomiting)   ° Pre-diabetes   ° Stroke (HCC) 2013  ° hemmorahgic  ° ° °Past Surgical History:  °Procedure Laterality Date  ° ANKLE SURGERY    ° APPENDECTOMY    ° CHOLECYSTECTOMY    ° HERNIA REPAIR    ° Esophagus  ° JOINT REPLACEMENT    ° total- right partial- left  ° MASTECTOMY PARTIAL / LUMPECTOMY  2012  ° left  ° ORIF ANKLE FRACTURE Left 07/20/2018  ° Procedure: OPEN REDUCTION INTERNAL FIXATION (ORIF) ANKLE FRACTURE;  Surgeon: Hewitt, John, MD;  [facility];  Service: Orthopedics;  Laterality: Left;  ° TOTAL KNEE ARTHROPLASTY Left 01/27/2019  ° Procedure: TOTAL KNEE ARTHROPLASTY;  Surgeon: Lucey, Steve, MD;  [facility];  Service: Orthopedics;  Laterality: Left;  ° ° °There were no vitals filed for this visit. ° ° Subjective  Assessment - 09/26/21 1118   ° ° Subjective Patient reports that she feels like she is getting stronger.  Reports that she is having some left shoulder pain and some left ankle pain at times   ° Currently in Pain? Yes   ° Pain Score 4    ° Pain Location Shoulder   ° Pain Orientation Left   ° Pain Descriptors / Indicators Sore;Spasm;Tightness   ° Aggravating Factors  walking   ° °  °  ° °  ° ° ° ° ° ° ° ° ° ° ° ° ° ° ° ° ° ° ° ° [facility] Adult PT Treatment/Exercise - 09/26/21 0001   ° °  ° Ambulation/Gait  ° Gait Comments FWW CGA 110'x1 and then 75' x 1   °  ° Knee/Hip Exercises: Aerobic  ° Nustep level 5 x 7 minutes   °  ° Knee/Hip Exercises: Seated  ° Long Arc Quad Strengthening;Both;2 sets;10 reps;Weights   ° Long Arc Quad Weight 5 lbs.   ° Marching Both;2 sets;10 reps   ° Marching Weights 5 lbs.   °  ° Manual Therapy  ° Manual therapy comments STM to the left upper trap   ° °  °  ° °  ° ° ° ° ° ° ° ° ° ° ° °   PT Short Term Goals - 09/15/21 1145   ° °  ° PT SHORT TERM GOAL #1  ° Title independent with initial HEP   ° Status Partially Met   ° °  °  ° °  ° ° ° ° PT Long Term Goals - 09/22/21 1109   ° °  ° PT LONG TERM GOAL #1  ° Title walk with FWW x 150 feet without rest with supervision only   ° Status On-going   °  ° PT LONG TERM GOAL #2  ° Title increase left knee strength to 4/5   ° Status On-going   ° °  °  ° °  ° ° ° ° ° ° ° ° Plan - 09/26/21 1251   ° ° Clinical Impression Statement Patient much more short of breath with walking today, she was able to do what we did last week, but much more short of breath and fatigued.  She did have a little more left shoulder/upper trap pain and spasm, She is doing much better with the safety with transfers with less verbal cues   ° PT Next Visit Plan work on safety and core   ° Consulted and Agree with Plan of Care Patient   ° °  °  ° °  ° ° °Patient will benefit from skilled therapeutic intervention in order to improve the following deficits and impairments:  Abnormal gait,  Decreased range of motion, Difficulty walking, Impaired UE functional use, Increased muscle spasms, Cardiopulmonary status limiting activity, Decreased endurance, Decreased activity tolerance, Pain, Decreased balance, Decreased mobility, Decreased strength ° °Visit Diagnosis: °Muscle weakness (generalized) ° °Hemiplegia and hemiparesis following cerebral infarction affecting left non-dominant side (HCC) ° °Ataxia ° °Pain in left ankle and joints of left foot ° °Late effects of CVA (cerebrovascular accident) ° °Left shoulder pain, unspecified chronicity ° °Difficulty in walking, not elsewhere classified ° ° ° ° °Problem List °Patient Active Problem List  ° Diagnosis Date Noted  ° Secondary hypercoagulable state (HCC) 03/16/2021  ° Dizziness 03/10/2021  ° Orthostatic hypotension 10/15/2020  ° Presbycusis of both ears 03/08/2020  ° Mixed stress and urge urinary incontinence 12/02/2019  ° Macrocytosis 12/01/2019  ° Nutritional anemia 12/01/2019  ° S/P total knee replacement 01/27/2019  ° Recurrent left knee instability 07/05/2018  ° Respiratory failure with hypoxia (HCC) 08/30/2017  ° Hypoxemia   ° Heart failure with preserved ejection fraction (HCC), Grade 3 diastolic dysfunction 03/26/2017  ° PAF (paroxysmal atrial fibrillation) (HCC)   ° Dyspnea 03/19/2017  ° Encounter for preventive health examination 02/17/2016  ° Sensorineural hearing loss (SNHL), bilateral 01/26/2016  ° Morbid obesity (HCC) 06/17/2015  ° Hypomagnesemia 04/24/2014  ° Hemiparesis affecting left side as late effect of cerebrovascular accident (HCC) 04/24/2014  ° Nontraumatic cerebral hemorrhage (HCC) 04/30/2012  ° DM (diabetes mellitus) with complications (HCC) 03/04/2010  ° OSTEOPENIA 01/21/2009  ° UNSPECIFIED VITAMIN D DEFICIENCY 11/19/2007  ° HYPERCHOLESTEROLEMIA 10/25/2006  ° GASTROESOPHAGEAL REFLUX, NO ESOPHAGITIS 10/25/2006  ° DIVERTICULOSIS OF COLON 10/25/2006  ° Osteoarthritis 10/25/2006  ° CERVICAL SPINE DISORDER, NOS 10/25/2006   ° ° °, W, PT °09/26/2021, 12:53 PM ° °[place] °[facility] °[address] °[place], [place], [address] °Phone: [phone]   Fax:  [phone] ° °Name: Karen Jackson °MRN: [id number] °Date of Birth: 11/05/1940 ° ° ° °

## 2021-09-29 ENCOUNTER — Other Ambulatory Visit: Payer: Self-pay

## 2021-09-29 ENCOUNTER — Encounter: Payer: Self-pay | Admitting: Physical Therapy

## 2021-09-29 ENCOUNTER — Ambulatory Visit: Payer: Medicare Other | Attending: Family Medicine | Admitting: Physical Therapy

## 2021-09-29 DIAGNOSIS — I69354 Hemiplegia and hemiparesis following cerebral infarction affecting left non-dominant side: Secondary | ICD-10-CM | POA: Insufficient documentation

## 2021-09-29 DIAGNOSIS — M6281 Muscle weakness (generalized): Secondary | ICD-10-CM | POA: Diagnosis not present

## 2021-09-29 DIAGNOSIS — M25572 Pain in left ankle and joints of left foot: Secondary | ICD-10-CM | POA: Insufficient documentation

## 2021-09-29 DIAGNOSIS — M25512 Pain in left shoulder: Secondary | ICD-10-CM | POA: Insufficient documentation

## 2021-09-29 DIAGNOSIS — I6990 Unspecified sequelae of unspecified cerebrovascular disease: Secondary | ICD-10-CM | POA: Diagnosis not present

## 2021-09-29 DIAGNOSIS — R270 Ataxia, unspecified: Secondary | ICD-10-CM | POA: Insufficient documentation

## 2021-09-29 DIAGNOSIS — R262 Difficulty in walking, not elsewhere classified: Secondary | ICD-10-CM | POA: Insufficient documentation

## 2021-09-29 NOTE — Therapy (Signed)
[place]. [place], [place], [address] Phone: [phone]   Fax:  [phone]  Physical Therapy Treatment  Patient Details  Name: Karen Jackson MRN: [id number] Date of Birth: Sep 30, 1940 Referring Provider (PT): Hensel   Encounter Date: 09/29/2021   PT End of Session - 09/29/21 1145     Visit Number 15    Date for PT Re-Evaluation 11/01/21    Authorization Type Medicare    PT Start Time 1100    PT Stop Time 5732    PT Time Calculation (min) 45 min    Activity Tolerance Patient tolerated treatment well;Patient limited by fatigue    Behavior During Therapy [facility] for tasks assessed/performed             Past Medical History:  Diagnosis Date   Acute cystitis without hematuria    Acute diastolic CHF (congestive heart failure) (HCC)    Arthritis    Dyspnea    Dysrhythmia    Fever of unknown origin 03/19/2017   Hyperlipidemia    Hypertension    denies at preop   Multifocal pneumonia    Neuromuscular disorder ([place])    neuropathy left arm and foot   Osteopenia    Paralysis ([place])    partial left side from CVA    Persistent atrial fibrillation (HCC)    PONV (postoperative nausea and vomiting)    Pre-diabetes    Stroke [facility]) 2013   hemmorahgic    Past Surgical History:  Procedure Laterality Date   ANKLE SURGERY     APPENDECTOMY     CHOLECYSTECTOMY     HERNIA REPAIR     Esophagus   JOINT REPLACEMENT     total- right partial- left   MASTECTOMY PARTIAL / LUMPECTOMY  2012   left   ORIF ANKLE FRACTURE Left 07/20/2018   Procedure: OPEN REDUCTION INTERNAL FIXATION (ORIF) ANKLE FRACTURE;  Surgeon: Wylene Simmer, MD;  Location: [facility];  Service: Orthopedics;  Laterality: Left;   TOTAL KNEE ARTHROPLASTY Left 01/27/2019   Procedure: TOTAL KNEE ARTHROPLASTY;  Surgeon: Vickey Huger, MD;  [facility];  Service: Orthopedics;  Laterality: Left;    There were no vitals filed for this visit.   Subjective  Assessment - 09/29/21 1109     Subjective I hurt all over, the cool wet weather always gets my joints    Currently in Pain? Yes    Pain Score 7     Pain Location --   joints, "all over"                              [facility] Adult PT Treatment/Exercise - 09/29/21 0001       Ambulation/Gait   Gait Comments 100' x 2 FWW, CGA, much better today with the transfers she is getting safer with this, only issue is when tired she really will plop and her chair is a transport chair that is light and can turn over and or roll away easy      High Level Balance   High Level Balance Comments in [place], marches, side stepping, hip abduction and hip extension      Knee/Hip Exercises: Stretches   Gastroc Stretch Left;3 reps;20 seconds      Knee/Hip Exercises: Aerobic   Nustep level 5 x 7 minutes      Knee/Hip Exercises: Seated   Other Seated Knee/Hip Exercises trunk ext 2 sets 10, red tband  left ankle  exercises      Knee/Hip Exercises: Supine   Bridges with Diona Foley Squeeze 10 reps;2 sets    Bridges with Clamshell 10 reps;2 sets    Other Supine Knee/Hip Exercises isometic abs      Knee/Hip Exercises: Sidelying   Hip ABduction Limitations hip ab/adduction with sliding board and pillow case    Clams left 3x 8      Manual Therapy   Manual therapy comments STM to the left upper trap                       PT Short Term Goals - 09/15/21 1145       PT SHORT TERM GOAL #1   Title independent with initial HEP    Status Partially Met               PT Long Term Goals - 09/29/21 1147       PT LONG TERM GOAL #1   Title walk with FWW x 150 feet without rest with supervision only    Status On-going      PT LONG TERM GOAL #2   Title increase left knee strength to 4/5    Status On-going                   Plan - 09/29/21 1145     Clinical Impression Statement Patient improving on her safety, she is still unsafe when tired.  She had more pain and  popping in the joints today , especially when trying to do hip abduction in standing the left shoulder was popping and very sore with this.    PT Next Visit Plan work on safety and core    Consulted and Agree with Plan of Care Patient             Patient will benefit from skilled therapeutic intervention in order to improve the following deficits and impairments:  Abnormal gait, Decreased range of motion, Difficulty walking, Impaired UE functional use, Increased muscle spasms, Cardiopulmonary status limiting activity, Decreased endurance, Decreased activity tolerance, Pain, Decreased balance, Decreased mobility, Decreased strength  Visit Diagnosis: Muscle weakness (generalized)  Ataxia  Pain in left ankle and joints of left foot  Late effects of CVA (cerebrovascular accident)  Left shoulder pain, unspecified chronicity  Difficulty in walking, not elsewhere classified     Problem List Patient Active Problem List   Diagnosis Date Noted   Secondary hypercoagulable state ([place]) 03/16/2021   Dizziness 03/10/2021   Orthostatic hypotension 10/15/2020   Presbycusis of both ears 03/08/2020   Mixed stress and urge urinary incontinence 12/02/2019   Macrocytosis 12/01/2019   Nutritional anemia 12/01/2019   S/P total knee replacement 01/27/2019   Recurrent left knee instability 07/05/2018   Respiratory failure with hypoxia ([place]) 08/30/2017   Hypoxemia    Heart failure with preserved ejection fraction (HCC), Grade 3 diastolic dysfunction 85/09/7739   PAF (paroxysmal atrial fibrillation) ([place])    Dyspnea 03/19/2017   Encounter for preventive health examination 02/17/2016   Sensorineural hearing loss (SNHL), bilateral 01/26/2016   Morbid obesity ([place]) 06/17/2015   Hypomagnesemia 04/24/2014   Hemiparesis affecting left side as late effect of cerebrovascular accident ([place]) 04/24/2014   Nontraumatic cerebral hemorrhage ([place]) 04/30/2012   DM (diabetes mellitus) with complications ([place])  28/78/6767   OSTEOPENIA 01/21/2009   UNSPECIFIED VITAMIN D DEFICIENCY 11/19/2007   HYPERCHOLESTEROLEMIA 10/25/2006   GASTROESOPHAGEAL REFLUX, NO ESOPHAGITIS 10/25/2006   DIVERTICULOSIS OF COLON 10/25/2006  Osteoarthritis 10/25/2006   CERVICAL SPINE DISORDER, NOS 10/25/2006    Sumner Boast, PT 09/29/2021, 11:48 AM  [place]. [place], [place], [address] Phone: [phone]   Fax:  [phone]  Name: Karen Jackson MRN: [id number] Date of Birth: [birth year]

## 2021-10-03 ENCOUNTER — Ambulatory Visit: Payer: Medicare Other | Admitting: Physical Therapy

## 2021-10-03 ENCOUNTER — Encounter: Payer: Self-pay | Admitting: Physical Therapy

## 2021-10-03 ENCOUNTER — Other Ambulatory Visit: Payer: Self-pay

## 2021-10-03 DIAGNOSIS — I6990 Unspecified sequelae of unspecified cerebrovascular disease: Secondary | ICD-10-CM | POA: Diagnosis not present

## 2021-10-03 DIAGNOSIS — M25572 Pain in left ankle and joints of left foot: Secondary | ICD-10-CM | POA: Diagnosis not present

## 2021-10-03 DIAGNOSIS — R270 Ataxia, unspecified: Secondary | ICD-10-CM | POA: Diagnosis not present

## 2021-10-03 DIAGNOSIS — M25512 Pain in left shoulder: Secondary | ICD-10-CM | POA: Diagnosis not present

## 2021-10-03 DIAGNOSIS — R262 Difficulty in walking, not elsewhere classified: Secondary | ICD-10-CM

## 2021-10-03 DIAGNOSIS — I69354 Hemiplegia and hemiparesis following cerebral infarction affecting left non-dominant side: Secondary | ICD-10-CM

## 2021-10-03 DIAGNOSIS — M6281 Muscle weakness (generalized): Secondary | ICD-10-CM

## 2021-10-03 NOTE — Therapy (Signed)
[place]. [place], [place], [address] Phone: [phone]   Fax:  [phone]  Physical Therapy Treatment  Patient Details  Name: Karen Jackson MRN: [id number] Date of Birth: Oct 15, 1940 Referring Provider (PT): Hensel   Encounter Date: 10/03/2021   PT End of Session - 10/03/21 1149     Visit Number 16    Date for PT Re-Evaluation 11/01/21    Authorization Type Medicare    PT Start Time 1053    PT Stop Time 1138    PT Time Calculation (min) 45 min    Activity Tolerance Patient tolerated treatment well;Patient limited by fatigue    Behavior During Therapy [facility] for tasks assessed/performed             Past Medical History:  Diagnosis Date   Acute cystitis without hematuria    Acute diastolic CHF (congestive heart failure) (HCC)    Arthritis    Dyspnea    Dysrhythmia    Fever of unknown origin 03/19/2017   Hyperlipidemia    Hypertension    denies at preop   Multifocal pneumonia    Neuromuscular disorder ([place])    neuropathy left arm and foot   Osteopenia    Paralysis ([place])    partial left side from CVA    Persistent atrial fibrillation (HCC)    PONV (postoperative nausea and vomiting)    Pre-diabetes    Stroke [facility]) 2013   hemmorahgic    Past Surgical History:  Procedure Laterality Date   ANKLE SURGERY     APPENDECTOMY     CHOLECYSTECTOMY     HERNIA REPAIR     Esophagus   JOINT REPLACEMENT     total- right partial- left   MASTECTOMY PARTIAL / LUMPECTOMY  2012   left   ORIF ANKLE FRACTURE Left 07/20/2018   Procedure: OPEN REDUCTION INTERNAL FIXATION (ORIF) ANKLE FRACTURE;  Surgeon: Wylene Simmer, MD;  Location: [facility];  Service: Orthopedics;  Laterality: Left;   TOTAL KNEE ARTHROPLASTY Left 01/27/2019   Procedure: TOTAL KNEE ARTHROPLASTY;  Surgeon: Vickey Huger, MD;  [facility];  Service: Orthopedics;  Laterality: Left;    There were no vitals filed for this visit.   Subjective  Assessment - 10/03/21 1145     Subjective Patient reports that the shoulder and ankle are really hurting today    Currently in Pain? Yes    Pain Score 8     Pain Location --   left shoulder, left ankle                              [facility] Adult PT Treatment/Exercise - 10/03/21 0001       Ambulation/Gait   Gait Comments 75' x 3 with FWW and CGA      Knee/Hip Exercises: Aerobic   Nustep level 5 x 8 minutes      Knee/Hip Exercises: Seated   Long Arc Quad Strengthening;Both;2 sets;10 reps;Weights    Long Arc Quad Weight 5 lbs.    Other Seated Knee/Hip Exercises trunk ext 2 sets 10, red tband left ankle  exercises    Other Seated Knee/Hip Exercises red tband ankle motions    Marching Both;2 sets;10 reps    Marching Weights 5 lbs.    Hamstring Curl 20 reps    Hamstring Limitations red tband      Manual Therapy   Manual therapy comments STM to the left  upper trap    Passive ROM left ankle for pain                       PT Short Term Goals - 09/15/21 1145       PT SHORT TERM GOAL #1   Title independent with initial HEP    Status Partially Met               PT Long Term Goals - 09/29/21 1147       PT LONG TERM GOAL #1   Title walk with FWW x 150 feet without rest with supervision only    Status On-going      PT LONG TERM GOAL #2   Title increase left knee strength to 4/5    Status On-going                   Plan - 10/03/21 1149     Clinical Impression Statement Patient wanted to try to weight herself, we did with with walker and she had to step up onto the scale, she was very fearful of this but was able to do without any issues.  Just fear.  She was having more pain today with the shoulder and the ankle,  This was helped with STM and with PROM.    PT Next Visit Plan work on safety and core    Consulted and Agree with Plan of Care Patient             Patient will benefit from skilled therapeutic intervention in  order to improve the following deficits and impairments:  Abnormal gait, Decreased range of motion, Difficulty walking, Impaired UE functional use, Increased muscle spasms, Cardiopulmonary status limiting activity, Decreased endurance, Decreased activity tolerance, Pain, Decreased balance, Decreased mobility, Decreased strength  Visit Diagnosis: Muscle weakness (generalized)  Pain in left ankle and joints of left foot  Late effects of CVA (cerebrovascular accident)  Left shoulder pain, unspecified chronicity  Difficulty in walking, not elsewhere classified  Hemiplegia and hemiparesis following cerebral infarction affecting left non-dominant side [facility])     Problem List Patient Active Problem List   Diagnosis Date Noted   Secondary hypercoagulable state ([place]) 03/16/2021   Dizziness 03/10/2021   Orthostatic hypotension 10/15/2020   Presbycusis of both ears 03/08/2020   Mixed stress and urge urinary incontinence 12/02/2019   Macrocytosis 12/01/2019   Nutritional anemia 12/01/2019   S/P total knee replacement 01/27/2019   Recurrent left knee instability 07/05/2018   Respiratory failure with hypoxia ([place]) 08/30/2017   Hypoxemia    Heart failure with preserved ejection fraction (HCC), Grade 3 diastolic dysfunction 26/83/4196   PAF (paroxysmal atrial fibrillation) ([place])    Dyspnea 03/19/2017   Encounter for preventive health examination 02/17/2016   Sensorineural hearing loss (SNHL), bilateral 01/26/2016   Morbid obesity ([place]) 06/17/2015   Hypomagnesemia 04/24/2014   Hemiparesis affecting left side as late effect of cerebrovascular accident ([place]) 04/24/2014   Nontraumatic cerebral hemorrhage ([place]) 04/30/2012   DM (diabetes mellitus) with complications ([place]) 22/29/7989   OSTEOPENIA 01/21/2009   UNSPECIFIED VITAMIN D DEFICIENCY 11/19/2007   HYPERCHOLESTEROLEMIA 10/25/2006   GASTROESOPHAGEAL REFLUX, NO ESOPHAGITIS 10/25/2006   DIVERTICULOSIS OF COLON 10/25/2006   Osteoarthritis  10/25/2006   CERVICAL SPINE DISORDER, NOS 10/25/2006    Sumner Boast, PT 10/03/2021, 11:52 AM  [place]. [place], [place], [address] Phone: [phone]   Fax:  [phone]  Name: Karen Neglia  Jackson MRN: [id number] Date of Birth: [birth year]

## 2021-10-06 ENCOUNTER — Encounter: Payer: Self-pay | Admitting: Physical Therapy

## 2021-10-06 ENCOUNTER — Other Ambulatory Visit: Payer: Self-pay

## 2021-10-06 ENCOUNTER — Ambulatory Visit: Payer: Medicare Other | Admitting: Physical Therapy

## 2021-10-06 DIAGNOSIS — M25572 Pain in left ankle and joints of left foot: Secondary | ICD-10-CM

## 2021-10-06 DIAGNOSIS — I6990 Unspecified sequelae of unspecified cerebrovascular disease: Secondary | ICD-10-CM

## 2021-10-06 DIAGNOSIS — R262 Difficulty in walking, not elsewhere classified: Secondary | ICD-10-CM

## 2021-10-06 DIAGNOSIS — R270 Ataxia, unspecified: Secondary | ICD-10-CM

## 2021-10-06 DIAGNOSIS — M25512 Pain in left shoulder: Secondary | ICD-10-CM | POA: Diagnosis not present

## 2021-10-06 DIAGNOSIS — I69354 Hemiplegia and hemiparesis following cerebral infarction affecting left non-dominant side: Secondary | ICD-10-CM

## 2021-10-06 DIAGNOSIS — M6281 Muscle weakness (generalized): Secondary | ICD-10-CM

## 2021-10-06 NOTE — Therapy (Signed)
[place]. [place], [place], [address] Phone: [phone]   Fax:  [phone]  Physical Therapy Treatment  Patient Details  Name: Karen Jackson MRN: [id number] Date of Birth: 06/27/1941 Referring Provider (PT): Hensel   Encounter Date: 10/06/2021   PT End of Session - 10/06/21 1147     Visit Number 17    Date for PT Re-Evaluation 11/01/21    Authorization Type Medicare    PT Start Time 1050    PT Stop Time 1139    PT Time Calculation (min) 49 min    Activity Tolerance Patient tolerated treatment well;Patient limited by fatigue    Behavior During Therapy [facility] for tasks assessed/performed             Past Medical History:  Diagnosis Date   Acute cystitis without hematuria    Acute diastolic CHF (congestive heart failure) (HCC)    Arthritis    Dyspnea    Dysrhythmia    Fever of unknown origin 03/19/2017   Hyperlipidemia    Hypertension    denies at preop   Multifocal pneumonia    Neuromuscular disorder ([place])    neuropathy left arm and foot   Osteopenia    Paralysis ([place])    partial left side from CVA    Persistent atrial fibrillation (HCC)    PONV (postoperative nausea and vomiting)    Pre-diabetes    Stroke [facility]) 2013   hemmorahgic    Past Surgical History:  Procedure Laterality Date   ANKLE SURGERY     APPENDECTOMY     CHOLECYSTECTOMY     HERNIA REPAIR     Esophagus   JOINT REPLACEMENT     total- right partial- left   MASTECTOMY PARTIAL / LUMPECTOMY  2012   left   ORIF ANKLE FRACTURE Left 07/20/2018   Procedure: OPEN REDUCTION INTERNAL FIXATION (ORIF) ANKLE FRACTURE;  Surgeon: Wylene Simmer, MD;  Location: [facility];  Service: Orthopedics;  Laterality: Left;   TOTAL KNEE ARTHROPLASTY Left 01/27/2019   Procedure: TOTAL KNEE ARTHROPLASTY;  Surgeon: Vickey Huger, MD;  [facility];  Service: Orthopedics;  Laterality: Left;    There were no vitals filed for this visit.   Subjective  Assessment - 10/06/21 1143     Subjective Patient reports that she was diagnosed with pulmonary hypertension.  She will see MD soon and they are talking about changing a medicine which may help with the fatigue and SOB she has with exertion    Currently in Pain? No/denies                               [facility] Adult PT Treatment/Exercise - 10/06/21 0001       Ambulation/Gait   Gait Comments walk in from car negotiate thresholds which is scary for her.  Then 100', then 120 ' with the FWW and CGA      Knee/Hip Exercises: Standing   Other Standing Knee Exercises 4" toe touches 2x10 each      Knee/Hip Exercises: Supine   Bridges with Diona Foley Squeeze 10 reps;2 sets    Bridges with Clamshell 10 reps;2 sets    Other Supine Knee/Hip Exercises left hip on sliding board abduction and adduction    Other Supine Knee/Hip Exercises crunches 2x10      Knee/Hip Exercises: Sidelying   Hip ABduction Limitations left hip abduction    Clams left 3x 8  PT Short Term Goals - 09/15/21 1145       PT SHORT TERM GOAL #1   Title independent with initial HEP    Status Partially Met               PT Long Term Goals - 10/06/21 1149       PT LONG TERM GOAL #1   Title walk with FWW x 150 feet without rest with supervision only    Status On-going      PT LONG TERM GOAL #2   Title increase left knee strength to 4/5    Status On-going                   Plan - 10/06/21 1147     Clinical Impression Statement Patient is doing better overall, she does report that she has lost 12# with [place] weighing her last visit, she reports that she thinks it is the ozempic.  She still struggles with the SOB with exertion, she reports that she will be seeing MD soon and that she may try a new drug, I feel that she is moving better, faster and easier, still requiring the CGA and still limited to about 120 feet    PT Next Visit Plan continue to work on  maximizing her function    Consulted and Agree with Plan of Care Patient             Patient will benefit from skilled therapeutic intervention in order to improve the following deficits and impairments:  Abnormal gait, Decreased range of motion, Difficulty walking, Impaired UE functional use, Increased muscle spasms, Cardiopulmonary status limiting activity, Decreased endurance, Decreased activity tolerance, Pain, Decreased balance, Decreased mobility, Decreased strength  Visit Diagnosis: Muscle weakness (generalized)  Pain in left ankle and joints of left foot  Late effects of CVA (cerebrovascular accident)  Left shoulder pain, unspecified chronicity  Difficulty in walking, not elsewhere classified  Hemiplegia and hemiparesis following cerebral infarction affecting left non-dominant side [facility])  Ataxia     Problem List Patient Active Problem List   Diagnosis Date Noted   Secondary hypercoagulable state ([place]) 03/16/2021   Dizziness 03/10/2021   Orthostatic hypotension 10/15/2020   Presbycusis of both ears 03/08/2020   Mixed stress and urge urinary incontinence 12/02/2019   Macrocytosis 12/01/2019   Nutritional anemia 12/01/2019   S/P total knee replacement 01/27/2019   Recurrent left knee instability 07/05/2018   Respiratory failure with hypoxia ([place]) 08/30/2017   Hypoxemia    Heart failure with preserved ejection fraction (HCC), Grade 3 diastolic dysfunction 39/53/2023   PAF (paroxysmal atrial fibrillation) ([place])    Dyspnea 03/19/2017   Encounter for preventive health examination 02/17/2016   Sensorineural hearing loss (SNHL), bilateral 01/26/2016   Morbid obesity ([place]) 06/17/2015   Hypomagnesemia 04/24/2014   Hemiparesis affecting left side as late effect of cerebrovascular accident ([place]) 04/24/2014   Nontraumatic cerebral hemorrhage ([place]) 04/30/2012   DM (diabetes mellitus) with complications ([place]) 34/35/6861   OSTEOPENIA 01/21/2009   UNSPECIFIED VITAMIN D  DEFICIENCY 11/19/2007   HYPERCHOLESTEROLEMIA 10/25/2006   GASTROESOPHAGEAL REFLUX, NO ESOPHAGITIS 10/25/2006   DIVERTICULOSIS OF COLON 10/25/2006   Osteoarthritis 10/25/2006   CERVICAL SPINE DISORDER, NOS 10/25/2006    Sumner Boast, PT 10/06/2021, 11:50 AM  [place]. [place], [place], [address] Phone: [phone]   Fax:  [phone]  Name: ALICHA RASPBERRY MRN: [id number] Date of Birth: [birth year]

## 2021-10-10 ENCOUNTER — Ambulatory Visit: Payer: Medicare Other | Admitting: Physical Therapy

## 2021-10-10 ENCOUNTER — Other Ambulatory Visit: Payer: Self-pay

## 2021-10-10 ENCOUNTER — Encounter: Payer: Self-pay | Admitting: Physical Therapy

## 2021-10-10 DIAGNOSIS — M25512 Pain in left shoulder: Secondary | ICD-10-CM

## 2021-10-10 DIAGNOSIS — M25572 Pain in left ankle and joints of left foot: Secondary | ICD-10-CM

## 2021-10-10 DIAGNOSIS — R270 Ataxia, unspecified: Secondary | ICD-10-CM | POA: Diagnosis not present

## 2021-10-10 DIAGNOSIS — M6281 Muscle weakness (generalized): Secondary | ICD-10-CM | POA: Diagnosis not present

## 2021-10-10 DIAGNOSIS — R262 Difficulty in walking, not elsewhere classified: Secondary | ICD-10-CM

## 2021-10-10 DIAGNOSIS — I69354 Hemiplegia and hemiparesis following cerebral infarction affecting left non-dominant side: Secondary | ICD-10-CM

## 2021-10-10 DIAGNOSIS — I6990 Unspecified sequelae of unspecified cerebrovascular disease: Secondary | ICD-10-CM | POA: Diagnosis not present

## 2021-10-10 NOTE — Therapy (Signed)
[place] [place], [place], [address] Phone: [phone]   Fax:  [phone]  Physical Therapy Treatment  Patient Details  Name: Karen Jackson MRN: [id number] Date of Birth: 1941/02/23 Referring Provider (PT): Hensel   Encounter Date: 10/10/2021   PT End of Session - 10/10/21 1137     Visit Number 18    Date for PT Re-Evaluation 11/01/21    Authorization Type Medicare    PT Start Time 1100    PT Stop Time 1142    PT Time Calculation (min) 42 min    Activity Tolerance Patient tolerated treatment well;Patient limited by fatigue    Behavior During Therapy [facility] for tasks assessed/performed             Past Medical History:  Diagnosis Date   Acute cystitis without hematuria    Acute diastolic CHF (congestive heart failure) (HCC)    Arthritis    Dyspnea    Dysrhythmia    Fever of unknown origin 03/19/2017   Hyperlipidemia    Hypertension    denies at preop   Multifocal pneumonia    Neuromuscular disorder ([place])    neuropathy left arm and foot   Osteopenia    Paralysis ([place])    partial left side from CVA    Persistent atrial fibrillation (HCC)    PONV (postoperative nausea and vomiting)    Pre-diabetes    Stroke [facility]) 2013   hemmorahgic    Past Surgical History:  Procedure Laterality Date   ANKLE SURGERY     APPENDECTOMY     CHOLECYSTECTOMY     HERNIA REPAIR     Esophagus   JOINT REPLACEMENT     total- right partial- left   MASTECTOMY PARTIAL / LUMPECTOMY  2012   left   ORIF ANKLE FRACTURE Left 07/20/2018   Procedure: OPEN REDUCTION INTERNAL FIXATION (ORIF) ANKLE FRACTURE;  Surgeon: Wylene Simmer, MD;  Location: [place];  Service: Orthopedics;  Laterality: Left;   TOTAL KNEE ARTHROPLASTY Left 01/27/2019   Procedure: TOTAL KNEE ARTHROPLASTY;  Surgeon: Vickey Huger, MD;  [facility];  Service: Orthopedics;  Laterality: Left;    There were no vitals filed for this visit.   Subjective  Assessment - 10/10/21 1102     Subjective I am a little stuffy in my nose and chest today    Currently in Pain? No/denies                               [facility] Adult PT Treatment/Exercise - 10/10/21 0001       Ambulation/Gait   Gait Comments 100', 125', 100'  and finished with 75'with FWW and CGA.      Knee/Hip Exercises: Aerobic   Nustep level 5 x 8 minutes      Knee/Hip Exercises: Seated   Long Arc Quad Strengthening;Both;2 sets;10 reps;Weights    Long Arc Quad Weight 5 lbs.    Other Seated Knee/Hip Exercises trunk ext 2 sets 10, red tband left ankle  exercises    Other Seated Knee/Hip Exercises red tband ankle motions    Marching Both;2 sets;10 reps    Marching Weights 5 lbs.      Manual Therapy   Manual therapy comments STM to the left upper trap    Passive ROM left ankle for pain  PT Short Term Goals - 09/15/21 1145       PT SHORT TERM GOAL #1   Title independent with initial HEP    Status Partially Met               PT Long Term Goals - 10/06/21 1149       PT LONG TERM GOAL #1   Title walk with FWW x 150 feet without rest with supervision only    Status On-going      PT LONG TERM GOAL #2   Title increase left knee strength to 4/5    Status On-going                   Plan - 10/10/21 1138     Clinical Impression Statement I focused more on walking today, did a total of 400 feet, she does get SOB but recovers well, on the 100' plus walks she does require about a 20 second standing rest break.    PT Next Visit Plan continue to work on [place] her function    Consulted and Agree with Plan of Care Patient             Patient will benefit from skilled therapeutic intervention in order to improve the following deficits and impairments:  Abnormal gait, Decreased range of motion, Difficulty walking, Impaired UE functional use, Increased muscle spasms, Cardiopulmonary status limiting  activity, Decreased endurance, Decreased activity tolerance, Pain, Decreased balance, Decreased mobility, Decreased strength  Visit Diagnosis: Muscle weakness (generalized)  Pain in left ankle and joints of left foot  Late effects of CVA (cerebrovascular accident)  Left shoulder pain, unspecified chronicity  Difficulty in walking, not elsewhere classified  Hemiplegia and hemiparesis following cerebral infarction affecting left non-dominant side [facility])  Ataxia     Problem List Patient Active Problem List   Diagnosis Date Noted   Secondary hypercoagulable state ([place]) 03/16/2021   Dizziness 03/10/2021   Orthostatic hypotension 10/15/2020   Presbycusis of both ears 03/08/2020   Mixed stress and urge urinary incontinence 12/02/2019   Macrocytosis 12/01/2019   Nutritional anemia 12/01/2019   S/P total knee replacement 01/27/2019   Recurrent left knee instability 07/05/2018   Respiratory failure with hypoxia ([place]) 08/30/2017   Hypoxemia    Heart failure with preserved ejection fraction (HCC), Grade 3 diastolic dysfunction 63/84/6659   PAF (paroxysmal atrial fibrillation) ([place])    Dyspnea 03/19/2017   Encounter for preventive health examination 02/17/2016   Sensorineural hearing loss (SNHL), bilateral 01/26/2016   Morbid obesity ([place]) 06/17/2015   Hypomagnesemia 04/24/2014   Hemiparesis affecting left side as late effect of cerebrovascular accident ([place]) 04/24/2014   Nontraumatic cerebral hemorrhage ([place]) 04/30/2012   DM (diabetes mellitus) with complications ([place]) 93/57/0177   OSTEOPENIA 01/21/2009   UNSPECIFIED VITAMIN D DEFICIENCY 11/19/2007   HYPERCHOLESTEROLEMIA 10/25/2006   GASTROESOPHAGEAL REFLUX, NO ESOPHAGITIS 10/25/2006   DIVERTICULOSIS OF COLON 10/25/2006   Osteoarthritis 10/25/2006   CERVICAL SPINE DISORDER, NOS 10/25/2006    Sumner Boast, PT 10/10/2021, 11:40 AM  [address]. [place], [place], [address] Phone: [phone]   Fax:  [phone]  Name: Karen Jackson MRN: [id number] Date of Birth: [birth year]

## 2021-10-13 ENCOUNTER — Ambulatory Visit: Payer: Medicare Other | Admitting: Physical Therapy

## 2021-10-13 ENCOUNTER — Other Ambulatory Visit: Payer: Self-pay

## 2021-10-13 ENCOUNTER — Encounter: Payer: Self-pay | Admitting: Physical Therapy

## 2021-10-13 DIAGNOSIS — R270 Ataxia, unspecified: Secondary | ICD-10-CM

## 2021-10-13 DIAGNOSIS — I6990 Unspecified sequelae of unspecified cerebrovascular disease: Secondary | ICD-10-CM | POA: Diagnosis not present

## 2021-10-13 DIAGNOSIS — R262 Difficulty in walking, not elsewhere classified: Secondary | ICD-10-CM | POA: Diagnosis not present

## 2021-10-13 DIAGNOSIS — M25572 Pain in left ankle and joints of left foot: Secondary | ICD-10-CM

## 2021-10-13 DIAGNOSIS — M6281 Muscle weakness (generalized): Secondary | ICD-10-CM

## 2021-10-13 DIAGNOSIS — I69354 Hemiplegia and hemiparesis following cerebral infarction affecting left non-dominant side: Secondary | ICD-10-CM

## 2021-10-13 DIAGNOSIS — M25512 Pain in left shoulder: Secondary | ICD-10-CM

## 2021-10-13 NOTE — Therapy (Signed)
[place]. [place], [place], [address] Phone: [phone]   Fax:  [phone]  Physical Therapy Treatment  Patient Details  Name: Karen Jackson MRN: [id number] Date of Birth: 1941/08/07 Referring Provider (PT): Hensel   Encounter Date: 10/13/2021   PT End of Session - 10/13/21 1200     Visit Number 19    Date for PT Re-Evaluation 11/01/21    Authorization Type Medicare    PT Start Time 1050    PT Stop Time 1135    PT Time Calculation (min) 45 min    Activity Tolerance Patient tolerated treatment well;Patient limited by fatigue    Behavior During Therapy [facility] for tasks assessed/performed             Past Medical History:  Diagnosis Date   Acute cystitis without hematuria    Acute diastolic CHF (congestive heart failure) (HCC)    Arthritis    Dyspnea    Dysrhythmia    Fever of unknown origin 03/19/2017   Hyperlipidemia    Hypertension    denies at preop   Multifocal pneumonia    Neuromuscular disorder ([place])    neuropathy left arm and foot   Osteopenia    Paralysis ([place])    partial left side from CVA    Persistent atrial fibrillation (HCC)    PONV (postoperative nausea and vomiting)    Pre-diabetes    Stroke [facility]) 2013   hemmorahgic    Past Surgical History:  Procedure Laterality Date   ANKLE SURGERY     APPENDECTOMY     CHOLECYSTECTOMY     HERNIA REPAIR     Esophagus   JOINT REPLACEMENT     total- right partial- left   MASTECTOMY PARTIAL / LUMPECTOMY  2012   left   ORIF ANKLE FRACTURE Left 07/20/2018   Procedure: OPEN REDUCTION INTERNAL FIXATION (ORIF) ANKLE FRACTURE;  Surgeon: Wylene Simmer, MD;  Location: [place];  Service: Orthopedics;  Laterality: Left;   TOTAL KNEE ARTHROPLASTY Left 01/27/2019   Procedure: TOTAL KNEE ARTHROPLASTY;  Surgeon: Vickey Huger, MD;  [facility];  Service: Orthopedics;  Laterality: Left;    There were no vitals filed for this visit.   Subjective  Assessment - 10/13/21 1157     Subjective Doing okay, minimal ankle pain, the shoulder does hurt    Currently in Pain? Yes    Pain Score 5     Pain Location Shoulder   left upper trap   Aggravating Factors  walking                [facility] PT Assessment - 10/13/21 0001       Timed Up and Go Test   Normal TUG (seconds) 52                           [facility] Adult PT Treatment/Exercise - 10/13/21 0001       Ambulation/Gait   Gait Comments 100' x 2 with a walk from the car, she had difficutly with this as there is a very small slope      Knee/Hip Exercises: Aerobic   Nustep level 5 x 8 minutes      Knee/Hip Exercises: Standing   Other Standing Knee Exercises 4" toe touches 2x10 each, then tried to have her put her right foot up on the step, and have her bear weight through the left LE, this was very difficult and  she did not feel steady with this      Knee/Hip Exercises: Seated   Other Seated Knee/Hip Exercises trunk ext 2 sets 10, red tband left ankle  exercises    Other Seated Knee/Hip Exercises green tband ankle motions    Hamstring Curl 20 reps    Hamstring Limitations green tband      Knee/Hip Exercises: Supine   Quad Sets Both;2 sets;10 reps    Quad Sets Limitations 5#    Other Supine Knee/Hip Exercises left hip on sliding board abduction and adduction    Other Supine Knee/Hip Exercises crunches 2x10                       PT Short Term Goals - 09/15/21 1145       PT SHORT TERM GOAL #1   Title independent with initial HEP    Status Partially Met               PT Long Term Goals - 10/13/21 1202       PT LONG TERM GOAL #1   Title walk with FWW x 150 feet without rest with supervision only    Status On-going      PT LONG TERM GOAL #2   Title increase left knee strength to 4/5    Status Partially Met      PT LONG TERM GOAL #3   Title decrease TUG to 33 seconds    Status On-going      PT LONG TERM GOAL #4   Title  transfer independently with set up    Status On-going                   Plan - 10/13/21 1200     Clinical Impression Statement Patient improved the TUG time by 5 seconds, she still struggles with change, as when I go outside to have her walk in this tends to throw her off as it is a change, she also struggled with a very small slope from the car.  She tells me that she is feeling better and stronger at home, she does have issues with the threshold where she cannot lift the walker up and over, tends to try to push trhough and over and this caused unsteadiness    PT Next Visit Plan work on her gait, strength and overall function    Consulted and Agree with Plan of Care Patient             Patient will benefit from skilled therapeutic intervention in order to improve the following deficits and impairments:  Abnormal gait, Decreased range of motion, Difficulty walking, Impaired UE functional use, Increased muscle spasms, Cardiopulmonary status limiting activity, Decreased endurance, Decreased activity tolerance, Pain, Decreased balance, Decreased mobility, Decreased strength  Visit Diagnosis: Muscle weakness (generalized)  Pain in left ankle and joints of left foot  Late effects of CVA (cerebrovascular accident)  Left shoulder pain, unspecified chronicity  Difficulty in walking, not elsewhere classified  Hemiplegia and hemiparesis following cerebral infarction affecting left non-dominant side [facility])  Ataxia     Problem List Patient Active Problem List   Diagnosis Date Noted   Secondary hypercoagulable state ([place]) 03/16/2021   Dizziness 03/10/2021   Orthostatic hypotension 10/15/2020   Presbycusis of both ears 03/08/2020   Mixed stress and urge urinary incontinence 12/02/2019   Macrocytosis 12/01/2019   Nutritional anemia 12/01/2019   S/P total knee replacement 01/27/2019   Recurrent left knee instability 07/05/2018  Respiratory failure with hypoxia ([place])  08/30/2017   Hypoxemia    Heart failure with preserved ejection fraction ([place]), Grade 3 diastolic dysfunction 03/18/8287   PAF (paroxysmal atrial fibrillation) ([place])    Dyspnea 03/19/2017   Encounter for preventive health examination 02/17/2016   Sensorineural hearing loss (SNHL), bilateral 01/26/2016   Morbid obesity ([employer]) 06/17/2015   Hypomagnesemia 04/24/2014   Hemiparesis affecting left side as late effect of cerebrovascular accident ([place]) 04/24/2014   Nontraumatic cerebral hemorrhage ([place]) 04/30/2012   DM (diabetes mellitus) with complications ([place]) 33/74/4514   OSTEOPENIA 01/21/2009   UNSPECIFIED VITAMIN D DEFICIENCY 11/19/2007   HYPERCHOLESTEROLEMIA 10/25/2006   GASTROESOPHAGEAL REFLUX, NO ESOPHAGITIS 10/25/2006   DIVERTICULOSIS OF COLON 10/25/2006   Osteoarthritis 10/25/2006   CERVICAL SPINE DISORDER, NOS 10/25/2006    Sumner Boast, PT 10/13/2021, 12:03 PM  [place]. [place], [place], [address] Phone: [phone]   Fax:  [phone]  Name: Karen Jackson MRN: [id number] Date of Birth: Nov 08, 1940

## 2021-10-17 ENCOUNTER — Ambulatory Visit: Payer: Medicare Other | Admitting: Physical Therapy

## 2021-10-17 ENCOUNTER — Other Ambulatory Visit: Payer: Self-pay

## 2021-10-17 ENCOUNTER — Encounter: Payer: Self-pay | Admitting: Physical Therapy

## 2021-10-17 DIAGNOSIS — M25512 Pain in left shoulder: Secondary | ICD-10-CM | POA: Diagnosis not present

## 2021-10-17 DIAGNOSIS — I6930 Unspecified sequelae of cerebral infarction: Secondary | ICD-10-CM

## 2021-10-17 DIAGNOSIS — M6281 Muscle weakness (generalized): Secondary | ICD-10-CM | POA: Diagnosis not present

## 2021-10-17 DIAGNOSIS — I6990 Unspecified sequelae of unspecified cerebrovascular disease: Secondary | ICD-10-CM

## 2021-10-17 DIAGNOSIS — R262 Difficulty in walking, not elsewhere classified: Secondary | ICD-10-CM | POA: Diagnosis not present

## 2021-10-17 DIAGNOSIS — M25572 Pain in left ankle and joints of left foot: Secondary | ICD-10-CM | POA: Diagnosis not present

## 2021-10-17 DIAGNOSIS — R270 Ataxia, unspecified: Secondary | ICD-10-CM | POA: Diagnosis not present

## 2021-10-17 DIAGNOSIS — I69354 Hemiplegia and hemiparesis following cerebral infarction affecting left non-dominant side: Secondary | ICD-10-CM

## 2021-10-17 NOTE — Therapy (Signed)
[address]. [place], [place], [address] Phone: [phone]   Fax:  [phone] Progress Note Reporting Period 09/15/21 to 10/17/21 for visits 11-20  See note below for Objective Data and Assessment of Progress/Goals.     Physical Therapy Treatment  Patient Details  Name: Karen Jackson MRN: [id number] Date of Birth: Jan 18, 1941 Referring Provider (PT): Hensel   Encounter Date: 10/17/2021   PT End of Session - 10/17/21 1155     Visit Number 20    Date for PT Re-Evaluation 11/01/21    Authorization Type Medicare    PT Start Time 9211    PT Stop Time 1135    PT Time Calculation (min) 44 min    Activity Tolerance Patient tolerated treatment well;Patient limited by fatigue    Behavior During Therapy [facility] for tasks assessed/performed             Past Medical History:  Diagnosis Date   Acute cystitis without hematuria    Acute diastolic CHF (congestive heart failure) (HCC)    Arthritis    Dyspnea    Dysrhythmia    Fever of unknown origin 03/19/2017   Hyperlipidemia    Hypertension    denies at preop   Multifocal pneumonia    Neuromuscular disorder ([place])    neuropathy left arm and foot   Osteopenia    Paralysis ([place])    partial left side from CVA    Persistent atrial fibrillation (HCC)    PONV (postoperative nausea and vomiting)    Pre-diabetes    Stroke [facility]) 2013   hemmorahgic    Past Surgical History:  Procedure Laterality Date   ANKLE SURGERY     APPENDECTOMY     CHOLECYSTECTOMY     HERNIA REPAIR     Esophagus   JOINT REPLACEMENT     total- right partial- left   MASTECTOMY PARTIAL / LUMPECTOMY  2012   left   ORIF ANKLE FRACTURE Left 07/20/2018   Procedure: OPEN REDUCTION INTERNAL FIXATION (ORIF) ANKLE FRACTURE;  Surgeon: Wylene Simmer, MD;  Location: [place];  Service: Orthopedics;  Laterality: Left;   TOTAL KNEE ARTHROPLASTY Left 01/27/2019   Procedure: TOTAL KNEE ARTHROPLASTY;  Surgeon:  Vickey Huger, MD;  [facility];  Service: Orthopedics;  Laterality: Left;    There were no vitals filed for this visit.   Subjective Assessment - 10/17/21 1059     Subjective My caregivers really think I am doing better with my transfers and my safety    Currently in Pain? Yes    Pain Score 4     Pain Location Ankle    Pain Orientation Left    Pain Descriptors / Indicators Sore    Aggravating Factors  walking                               [facility] Adult PT Treatment/Exercise - 10/17/21 0001       Ambulation/Gait   Gait Comments 100' x 3      Knee/Hip Exercises: Aerobic   Nustep level 5 x 8 minutes      Knee/Hip Exercises: Seated   Long Arc Quad Strengthening;Both;2 sets;10 reps;Weights    Long Arc Quad Weight 5 lbs.    Other Seated Knee/Hip Exercises green tband ankle motions    Hamstring Curl 20 reps    Hamstring Limitations green tband      Manual Therapy  Manual therapy comments STM to the left upper trap    Passive ROM left ankle for pain                       PT Short Term Goals - 09/15/21 1145       PT SHORT TERM GOAL #1   Title independent with initial HEP    Status Partially Met               PT Long Term Goals - 10/13/21 1202       PT LONG TERM GOAL #1   Title walk with FWW x 150 feet without rest with supervision only    Status On-going      PT LONG TERM GOAL #2   Title increase left knee strength to 4/5    Status Partially Met      PT LONG TERM GOAL #3   Title decrease TUG to 33 seconds    Status On-going      PT LONG TERM GOAL #4   Title transfer independently with set up    Status On-going                   Plan - 10/17/21 1155     Clinical Impression Statement Patient had some wheezing at the end of the last two 100 feet ambulation distances.  She is seeing her MD later this week.  She had increased left shoulder/trap and ankle pain, she reports that she thinks it will rain later and  that is what is going on, I did a little more work on the shoulder today, she was very tight and sore/tender    PT Next Visit Plan work on her gait, strength and overall function    Consulted and Agree with Plan of Care Patient             Patient will benefit from skilled therapeutic intervention in order to improve the following deficits and impairments:  Abnormal gait, Decreased range of motion, Difficulty walking, Impaired UE functional use, Increased muscle spasms, Cardiopulmonary status limiting activity, Decreased endurance, Decreased activity tolerance, Pain, Decreased balance, Decreased mobility, Decreased strength  Visit Diagnosis: Muscle weakness (generalized)  Pain in left ankle and joints of left foot  Late effects of CVA (cerebrovascular accident)  Left shoulder pain, unspecified chronicity  Difficulty in walking, not elsewhere classified  Hemiplegia and hemiparesis following cerebral infarction affecting left non-dominant side [facility])  Ataxia     Problem List Patient Active Problem List   Diagnosis Date Noted   Secondary hypercoagulable state ([place]) 03/16/2021   Dizziness 03/10/2021   Orthostatic hypotension 10/15/2020   Presbycusis of both ears 03/08/2020   Mixed stress and urge urinary incontinence 12/02/2019   Macrocytosis 12/01/2019   Nutritional anemia 12/01/2019   S/P total knee replacement 01/27/2019   Recurrent left knee instability 07/05/2018   Respiratory failure with hypoxia ([place]) 08/30/2017   Hypoxemia    Heart failure with preserved ejection fraction (HCC), Grade 3 diastolic dysfunction 93/81/0175   PAF (paroxysmal atrial fibrillation) ([place])    Dyspnea 03/19/2017   Encounter for preventive health examination 02/17/2016   Sensorineural hearing loss (SNHL), bilateral 01/26/2016   Morbid obesity ([place]) 06/17/2015   Hypomagnesemia 04/24/2014   Hemiparesis affecting left side as late effect of cerebrovascular accident ([place]) 04/24/2014    Nontraumatic cerebral hemorrhage ([place]) 04/30/2012   DM (diabetes mellitus) with complications ([place]) 06/21/8526   OSTEOPENIA 01/21/2009   UNSPECIFIED VITAMIN D DEFICIENCY 11/19/2007  HYPERCHOLESTEROLEMIA 10/25/2006   GASTROESOPHAGEAL REFLUX, NO ESOPHAGITIS 10/25/2006   DIVERTICULOSIS OF COLON 10/25/2006   Osteoarthritis 10/25/2006   CERVICAL SPINE DISORDER, NOS 10/25/2006    Sumner Boast, PT 10/17/2021, 11:58 AM  [place]. [place], [place], [address] Phone: [phone]   Fax:  [phone]  Name: Karen Jackson MRN: [id number] Date of Birth: 30-Oct-1940

## 2021-10-19 ENCOUNTER — Encounter: Payer: Self-pay | Admitting: Physical Therapy

## 2021-10-19 ENCOUNTER — Ambulatory Visit: Payer: Medicare Other | Admitting: Physical Therapy

## 2021-10-19 ENCOUNTER — Other Ambulatory Visit: Payer: Self-pay

## 2021-10-19 DIAGNOSIS — M6281 Muscle weakness (generalized): Secondary | ICD-10-CM

## 2021-10-19 DIAGNOSIS — R270 Ataxia, unspecified: Secondary | ICD-10-CM | POA: Diagnosis not present

## 2021-10-19 DIAGNOSIS — I69354 Hemiplegia and hemiparesis following cerebral infarction affecting left non-dominant side: Secondary | ICD-10-CM

## 2021-10-19 DIAGNOSIS — M25512 Pain in left shoulder: Secondary | ICD-10-CM | POA: Diagnosis not present

## 2021-10-19 DIAGNOSIS — R262 Difficulty in walking, not elsewhere classified: Secondary | ICD-10-CM | POA: Diagnosis not present

## 2021-10-19 DIAGNOSIS — I6990 Unspecified sequelae of unspecified cerebrovascular disease: Secondary | ICD-10-CM | POA: Diagnosis not present

## 2021-10-19 DIAGNOSIS — M25572 Pain in left ankle and joints of left foot: Secondary | ICD-10-CM | POA: Diagnosis not present

## 2021-10-19 DIAGNOSIS — I6930 Unspecified sequelae of cerebral infarction: Secondary | ICD-10-CM

## 2021-10-19 NOTE — Therapy (Signed)
[place] [place], [place], [address] Phone: [phone]   Fax:  [phone]  Physical Therapy Treatment  Patient Details  Name: Karen Jackson MRN: [id number] Date of Birth: 08-08-1941 Referring Provider (PT): Hensel   Encounter Date: 10/19/2021   PT End of Session - 10/19/21 1249     Visit Number 21    Date for PT Re-Evaluation 11/01/21    Authorization Type Medicare    PT Start Time 1055    PT Stop Time 1143    PT Time Calculation (min) 48 min    Activity Tolerance Patient tolerated treatment well;Patient limited by fatigue    Behavior During Therapy [facility] for tasks assessed/performed             Past Medical History:  Diagnosis Date   Acute cystitis without hematuria    Acute diastolic CHF (congestive heart failure) (HCC)    Arthritis    Dyspnea    Dysrhythmia    Fever of unknown origin 03/19/2017   Hyperlipidemia    Hypertension    denies at preop   Multifocal pneumonia    Neuromuscular disorder ([place])    neuropathy left arm and foot   Osteopenia    Paralysis ([place])    partial left side from CVA    Persistent atrial fibrillation (HCC)    PONV (postoperative nausea and vomiting)    Pre-diabetes    Stroke [facility]) 2013   hemmorahgic    Past Surgical History:  Procedure Laterality Date   ANKLE SURGERY     APPENDECTOMY     CHOLECYSTECTOMY     HERNIA REPAIR     Esophagus   JOINT REPLACEMENT     total- right partial- left   MASTECTOMY PARTIAL / LUMPECTOMY  2012   left   ORIF ANKLE FRACTURE Left 07/20/2018   Procedure: OPEN REDUCTION INTERNAL FIXATION (ORIF) ANKLE FRACTURE;  Surgeon: Wylene Simmer, MD;  Location: [place];  Service: Orthopedics;  Laterality: Left;   TOTAL KNEE ARTHROPLASTY Left 01/27/2019   Procedure: TOTAL KNEE ARTHROPLASTY;  Surgeon: Vickey Huger, MD;  Location: [facility];  Service: Orthopedics;  Laterality: Left;    There were no vitals filed for this visit.   Subjective  Assessment - 10/19/21 1103     Subjective Doing pretty good, less pain today    Currently in Pain? Yes    Pain Score 4     Pain Location Ankle    Pain Orientation Left    Pain Descriptors / Indicators Sore    Aggravating Factors  walking    Pain Relieving Factors you miving it helps                               [facility] Adult PT Treatment/Exercise - 10/19/21 0001       Ambulation/Gait   Gait Comments 100' x 3 with the FWW and CGA, she had one instance of a very poor transfer when sitting, she c/o fatigue      Knee/Hip Exercises: Aerobic   Nustep level 5 x 8 minutes      Knee/Hip Exercises: Seated   Long Arc Quad Strengthening;Both;10 reps;Weights;3 sets    Other Seated Knee/Hip Exercises green tband ankle motions    Hamstring Curl 20 reps    Hamstring Limitations green tband      Manual Therapy   Manual therapy comments STM to the left upper trap  Passive ROM left ankle for pain                       PT Short Term Goals - 09/15/21 1145       PT SHORT TERM GOAL #1   Title independent with initial HEP    Status Partially Met               PT Long Term Goals - 10/19/21 1251       PT LONG TERM GOAL #1   Title walk with FWW x 150 feet without rest with supervision only    Status On-going                   Plan - 10/19/21 1249     Clinical Impression Statement Patient is moving better, and easier, she still struggle with fatigue and shortness of breath, she will see MD tomorrow, she had one instance of a poor transfewr due to the fatigue.  Her O2 sats remain 96% and above.  She had more shoulder and ankle pain today so we did some manual work on this    PT Next Visit Plan see what the MD said and can we change her endurance    Consulted and Agree with Plan of Care Patient             Patient will benefit from skilled therapeutic intervention in order to improve the following deficits and impairments:  Abnormal  gait, Decreased range of motion, Difficulty walking, Impaired UE functional use, Increased muscle spasms, Cardiopulmonary status limiting activity, Decreased endurance, Decreased activity tolerance, Pain, Decreased balance, Decreased mobility, Decreased strength  Visit Diagnosis: Muscle weakness (generalized)  Pain in left ankle and joints of left foot  Late effects of CVA (cerebrovascular accident)  Left shoulder pain, unspecified chronicity  Difficulty in walking, not elsewhere classified  Hemiplegia and hemiparesis following cerebral infarction affecting left non-dominant side [facility])  Ataxia     Problem List Patient Active Problem List   Diagnosis Date Noted   Secondary hypercoagulable state ([place]) 03/16/2021   Dizziness 03/10/2021   Orthostatic hypotension 10/15/2020   Presbycusis of both ears 03/08/2020   Mixed stress and urge urinary incontinence 12/02/2019   Macrocytosis 12/01/2019   Nutritional anemia 12/01/2019   S/P total knee replacement 01/27/2019   Recurrent left knee instability 07/05/2018   Respiratory failure with hypoxia ([place]) 08/30/2017   Hypoxemia    Heart failure with preserved ejection fraction (HCC), Grade 3 diastolic dysfunction 91/22/5834   PAF (paroxysmal atrial fibrillation) ([place])    Dyspnea 03/19/2017   Encounter for preventive health examination 02/17/2016   Sensorineural hearing loss (SNHL), bilateral 01/26/2016   Morbid obesity ([place]) 06/17/2015   Hypomagnesemia 04/24/2014   Hemiparesis affecting left side as late effect of cerebrovascular accident ([place]) 04/24/2014   Nontraumatic cerebral hemorrhage ([place]) 04/30/2012   DM (diabetes mellitus) with complications ([place]) 62/19/4712   OSTEOPENIA 01/21/2009   UNSPECIFIED VITAMIN D DEFICIENCY 11/19/2007   HYPERCHOLESTEROLEMIA 10/25/2006   GASTROESOPHAGEAL REFLUX, NO ESOPHAGITIS 10/25/2006   DIVERTICULOSIS OF COLON 10/25/2006   Osteoarthritis 10/25/2006   CERVICAL SPINE DISORDER, NOS 10/25/2006     Sumner Boast, PT 10/19/2021, 12:52 PM  [address] [place], [place], [address] Phone: [phone]   Fax:  [phone]  Name: Karen Jackson MRN: [id number] Date of Birth: July 30, 1941

## 2021-10-19 NOTE — Progress Notes (Signed)
Cardiology Office Note:    Date:  10/20/2021   ID:  Karen Jackson, DOB 06/19/1941, MRN [id number]  PCP:  Zenia Resides, MD [place] Cardiologist: Donato Heinz, MD   Reason for visit: 48-month follow-up  History of Present Illness:    Karen Jackson is a 81 y.o. female with a hx of hemorrhagic CVA in 2013, paroxysmal atrial fibrillation, chronic diastolic heart failure, hypertension, hyperlipidemia.  Echo 06/03/2021 showed EF 50 to 55%, normal RV function, mild biatrial enlargement, mild MR, mild to moderate TR. Calcium score 2093 (97th percentile) on 05/25/2021.  No history of heart disease in her immediate family.  She saw Dr. Gardiner Rhyme in November 2022.  She mentioned feeling short of breath with minimal exertion and had lower extremity edema.  Today, she continues to complain of persistent shortness of breath with minimal exertion over the last 8 months.  She states she was able to walk 225 feet and now she is building up to walk 130 feet.  She has wheezing with exertion.  Her oxygen saturation is 97% after walking.  She mentions remote use of tobacco but quit 40 years ago.  She smoked for approximately 20 years with 1/4 pack/day.  She states for 2 hours in the morning she has a lot of phlegm.  She takes Flonase and Mucinex which has helped.  She states she had a bad case of pneumonia in 2019 but had recovered from it.  She denies history of COVID.  She states she has not had PFTs yet or seen pulmonology.  From a heart standpoint, she complains of chest twinges that last about 30 seconds that happen at rest.  No chest pain with exertion.  She complains of dependent edema and wears compression stockings on her bridge days.  Her Lasix was increased from 40 mg to 80 mg in January.  She denies palpitations.    Past Medical History:  Diagnosis Date   Acute cystitis without hematuria    Acute diastolic CHF (congestive heart failure) (HCC)    Arthritis    Dyspnea     Dysrhythmia    Fever of unknown origin 03/19/2017   Hyperlipidemia    Hypertension    denies at preop   Multifocal pneumonia    Neuromuscular disorder (HCC)    neuropathy left arm and foot   Osteopenia    Paralysis (HCC)    partial left side from CVA    Persistent atrial fibrillation (HCC)    PONV (postoperative nausea and vomiting)    Pre-diabetes    Stroke [facility]) 2013   hemmorahgic    Past Surgical History:  Procedure Laterality Date   ANKLE SURGERY     APPENDECTOMY     CHOLECYSTECTOMY     HERNIA REPAIR     Esophagus   JOINT REPLACEMENT     total- right partial- left   MASTECTOMY PARTIAL / LUMPECTOMY  2012   left   ORIF ANKLE FRACTURE Left 07/20/2018   Procedure: OPEN REDUCTION INTERNAL FIXATION (ORIF) ANKLE FRACTURE;  Surgeon: Wylene Simmer, MD;  Location: [place];  Service: Orthopedics;  Laterality: Left;   TOTAL KNEE ARTHROPLASTY Left 01/27/2019   Procedure: TOTAL KNEE ARTHROPLASTY;  Surgeon: Vickey Huger, MD;  [facility];  Service: Orthopedics;  Laterality: Left;    Current Medications: Current Meds  Medication Sig   apixaban (ELIQUIS) 5 MG TABS tablet Take 1 tablet (5 mg total) by mouth 2 (two) times daily.   atorvastatin (LIPITOR) 40 MG  tablet Take 1 tablet (40 mg total) by mouth daily.   Calcium Carb-Cholecalciferol (CALCIUM CARBONATE-VITAMIN D3) 600-400 MG-UNIT TABS Take 1 tablet by mouth daily.   celecoxib (CELEBREX) 200 MG capsule TAKE 1 CAPSULE BY MOUTH DAILY AS NEEDED FOR ARTHRITIS OR PAIN   diclofenac Sodium (VOLTAREN) 1 % GEL APPLY 2 GRAMS EXTERNALLY TO THE AFFECTED AREA FOUR TIMES DAILY   ergocalciferol (VITAMIN D2) 1.25 MG (50000 UT) capsule 50,000 unit   furosemide (LASIX) 80 MG tablet TAKE 1 TABLET BY MOUTH  DAILY   gabapentin (NEURONTIN) 100 MG capsule TAKE 1 CAPSULE BY MOUTH 3  TIMES DAILY   hydrocortisone 2.5 % cream Apply topically 2 (two) times daily as needed.   lidocaine (LIDODERM) 5 % Place 1 patch onto the skin daily. Remove & Discard patch  within 12 hours or as directed by MD   MAGNESIUM-OXIDE 400 (240 Mg) MG tablet TAKE 1 TABLET BY MOUTH DAILY   metoprolol succinate (TOPROL-XL) 50 MG 24 hr tablet Take 1 tablet (50 mg total) by mouth daily. Take with or immediately following a meal.   omeprazole (PRILOSEC) 20 MG capsule TAKE 1 CAPSULE BY MOUTH  DAILY   Semaglutide, 1 MG/DOSE, (OZEMPIC, 1 MG/DOSE,) 4 MG/3ML SOPN Inject 1 mg into the skin once a week.   traZODone (DESYREL) 50 MG tablet TAKE 1 TABLET BY MOUTH AT  BEDTIME   TURMERIC PO Take 1 capsule by mouth daily.     Allergies:   Codeine phosphate and Simvastatin   Social History   Socioeconomic History   Marital status: Widowed    Spouse name: Not on file   Number of children: 2   Years of education: college   Highest education level: Not on file  Occupational History   Occupation: RetiredGames developer  Tobacco Use   Smoking status: Former    Packs/day: 0.50    Years: 23.00    Pack years: 11.50    Types: Cigarettes    Start date: 08/28/1957    Quit date: 03/09/1981    Years since quitting: 40.6   Smokeless tobacco: Never  Vaping Use   Vaping Use: Never used  Substance and Sexual Activity   Alcohol use: Yes    Alcohol/week: 16.0 standard drinks    Types: 2 Glasses of wine, 14 Standard drinks or equivalent per week    Comment: daily one glass   Drug use: No   Sexual activity: Not Currently  Other Topics Concern   Not on file  Social History Narrative   ** Merged History Encounter ** Health Care POA: son, Charly RayEmergency Contact: Ann and Roosvelt Harps, niece & nephew (c) [phone] (h) [phone] of Life Plan: Who lives with you: selfAny pets: noneDiet: Pt has a varied diet of protein, vegetables and limits "white" foods.Exercise: Pt does water aerobics 3x week, golf 2x weekSeatbelts: Pt reports wearing seatbelt when in vehicle. Sun Exposure/Protection: Pt reports wearing sun screenHobbies: golfing, swimming    Lives alone (has caregivers coming in)   R handed     Social Determinants of Health   Financial Resource Strain: Not on file  Food Insecurity: Not on file  Transportation Needs: Not on file  Physical Activity: Not on file  Stress: Not on file  Social Connections: Not on file     Family History: The patient's family history includes Dementia in her father; Diabetes in her mother; Transient ischemic attack in her father.  ROS:   Please see the history of present illness.  EKGs/Labs/Other Studies Reviewed:    Recent Labs: 07/26/2021: ALT 15; BNP 118.8; BUN 21; Creatinine, Ser 0.61; Hemoglobin 13.4; Platelets 308; Potassium 3.6; Sodium 140; TSH 1.460   Recent Lipid Panel Lab Results  Component Value Date/Time   CHOL 183 07/26/2021 04:48 PM   TRIG 198 (H) 07/26/2021 04:48 PM   HDL 63 07/26/2021 04:48 PM   LDLCALC 87 07/26/2021 04:48 PM   LDLDIRECT 107 (H) 04/24/2014 11:18 AM    Physical Exam:    VS:  BP (!) 118/50    Pulse 80    Ht 4\' 10"  (1.473 m)    Wt 166 lb 3.2 oz (75.4 kg)    SpO2 94%    BMI 34.74 kg/m    No data found.  Wt Readings from Last 3 Encounters:  10/20/21 166 lb 3.2 oz (75.4 kg)  08/09/21 170 lb (77.1 kg)  07/26/21 170 lb (77.1 kg)     GEN:  Well nourished, well developed in no acute distress HEENT: Normal NECK: No JVD; No carotid bruits CARDIAC: Irregular irregular, no murmurs, rubs, gallops RESPIRATORY:  Clear to auscultation without rales, wheezing or rhonchi  ABDOMEN: Soft, non-tender, non-distended MUSCULOSKELETAL: No edema; wearing a brace on the left ankle SKIN: Warm and dry NEUROLOGIC:  Alert and oriented PSYCHIATRIC:  Normal affect     ASSESSMENT AND PLAN   CAD without chest pain -Calcium score 2093 (97th percentile) on 05/25/2021 -Lexiscan Myoview 2022: Low risk -Continue statin. -No antiplatelet secondary to Eliquis use.   Chronic diastolic heart failure, euvolemic -Echo 06/03/2021 showed EF 50 to 55%, normal RV function, mild biatrial enlargement, mild MR, mild to moderate TR,  indeterminate diastolic function.  -Continue Lasix 80 mg daily.  Check BMET today to ensure electrolytes and renal function are stable. -Continue compression stockings.   Dyspnea with hx of tobacco use -Low risk Myoview -Zio patch with 100% atrial fibrillation burden but rate controlled. -Rerefer to pulmonary and ordered pulmonary function test with exercise-induced shortness of breath/wheezing.  Question if she could benefit from a maintenance inhaler. -Patient is concerned about pulmonary hypertension seen on CT. -She had a CT of the chest was ordered for watchman evaluation  in 04/2021 that showed no suspicious masses or nodules.   Persistent atrial fibrillation -Follows with EP.  History of hemorrhagic stroke in 2013.   -Restarted anticoagulation after discussion with EP, Dr. Lars Mage & Neurology, Dr. Leonie Man.  Patient was determined not a watchman candidate secondary to her dyspnea/heart failure and ability to now tolerate Eliquis. -Zio patch 07/2021 with rate controlled a-fib (avg HR 84).  Continue Toprol XL.  Hemorrhagic CVA -In 2013.     Hypertension, well controlled -Continue current medications.   Hyperlipidemia -Continue atorvastatin.  LDL was 87 in November 2022.  Disposition - Follow-up in 6 months with Dr. Gardiner Rhyme.        Medication Adjustments/Labs and Tests Ordered: Current medicines are reviewed at length with the patient today.  Concerns regarding medicines are outlined above.  Orders Placed This Encounter  Procedures   Basic metabolic panel   Ambulatory referral to Pulmonology   No orders of the defined types were placed in this encounter.   Patient Instructions  Medication Instructions:  No Changes *If you need a refill on your cardiac medications before your next appointment, please call your pharmacy*   Lab Work: BMET If you have labs (blood work) drawn today and your tests are completely normal, you will receive your results only  by: [place] (if you  have MyChart) OR A paper copy in the mail If you have any lab test that is abnormal or we need to change your treatment, we will call you to review the results.   Testing/Procedures: No Testing   Follow-Up: At [facility], you and your health needs are our priority.  As part of our continuing mission to provide you with exceptional heart care, we have created designated Provider Care Teams.  These Care Teams include your primary Cardiologist (physician) and Advanced Practice Providers (APPs -  Physician Assistants and Nurse Practitioners) who all work together to provide you with the care you need, when you need it.  We recommend signing up for the patient portal called "MyChart".  Sign up information is provided on this After Visit Summary.  MyChart is used to connect with patients for Virtual Visits (Telemedicine).  Patients are able to view lab/test results, encounter notes, upcoming appointments, etc.  Non-urgent messages can be sent to your provider as well.   To learn more about what you can do with MyChart, go to [email or web address].    Your next appointment:   6 month(s)  The format for your next appointment:   In Person  Provider:   Donato Heinz, MD         Signed, Warren Lacy, PA-C  10/20/2021 11:11 AM    [place]

## 2021-10-20 ENCOUNTER — Encounter: Payer: Self-pay | Admitting: Physician Assistant

## 2021-10-20 ENCOUNTER — Ambulatory Visit (INDEPENDENT_AMBULATORY_CARE_PROVIDER_SITE_OTHER): Payer: Medicare Other | Admitting: Physician Assistant

## 2021-10-20 VITALS — BP 118/50 | HR 80 | Ht <= 58 in | Wt 166.2 lb

## 2021-10-20 DIAGNOSIS — E785 Hyperlipidemia, unspecified: Secondary | ICD-10-CM | POA: Diagnosis not present

## 2021-10-20 DIAGNOSIS — I2510 Atherosclerotic heart disease of native coronary artery without angina pectoris: Secondary | ICD-10-CM | POA: Diagnosis not present

## 2021-10-20 DIAGNOSIS — I4819 Other persistent atrial fibrillation: Secondary | ICD-10-CM | POA: Diagnosis not present

## 2021-10-20 DIAGNOSIS — I5032 Chronic diastolic (congestive) heart failure: Secondary | ICD-10-CM

## 2021-10-20 NOTE — Patient Instructions (Signed)
Medication Instructions:  No Changes *If you need a refill on your cardiac medications before your next appointment, please call your pharmacy*   Lab Work: BMET If you have labs (blood work) drawn today and your tests are completely normal, you will receive your results only by: [place] (if you have MyChart) OR A paper copy in the mail If you have any lab test that is abnormal or we need to change your treatment, we will call you to review the results.   Testing/Procedures: No Testing   Follow-Up: At [facility], you and your health needs are our priority.  As part of our continuing mission to provide you with exceptional heart care, we have created designated Provider Care Teams.  These Care Teams include your primary Cardiologist (physician) and Advanced Practice Providers (APPs -  Physician Assistants and Nurse Practitioners) who all work together to provide you with the care you need, when you need it.  We recommend signing up for the patient portal called "MyChart".  Sign up information is provided on this After Visit Summary.  MyChart is used to connect with patients for Virtual Visits (Telemedicine).  Patients are able to view lab/test results, encounter notes, upcoming appointments, etc.  Non-urgent messages can be sent to your provider as well.   To learn more about what you can do with MyChart, go to [email or web address].    Your next appointment:   6 month(s)  The format for your next appointment:   In Person  Provider:   Donato Heinz, MD

## 2021-10-24 ENCOUNTER — Encounter: Payer: Self-pay | Admitting: Physical Therapy

## 2021-10-24 ENCOUNTER — Other Ambulatory Visit: Payer: Self-pay

## 2021-10-24 ENCOUNTER — Ambulatory Visit: Payer: Medicare Other | Admitting: Physical Therapy

## 2021-10-24 DIAGNOSIS — M25572 Pain in left ankle and joints of left foot: Secondary | ICD-10-CM | POA: Diagnosis not present

## 2021-10-24 DIAGNOSIS — R262 Difficulty in walking, not elsewhere classified: Secondary | ICD-10-CM | POA: Diagnosis not present

## 2021-10-24 DIAGNOSIS — I69354 Hemiplegia and hemiparesis following cerebral infarction affecting left non-dominant side: Secondary | ICD-10-CM

## 2021-10-24 DIAGNOSIS — R270 Ataxia, unspecified: Secondary | ICD-10-CM | POA: Diagnosis not present

## 2021-10-24 DIAGNOSIS — M25512 Pain in left shoulder: Secondary | ICD-10-CM

## 2021-10-24 DIAGNOSIS — M6281 Muscle weakness (generalized): Secondary | ICD-10-CM

## 2021-10-24 DIAGNOSIS — I6990 Unspecified sequelae of unspecified cerebrovascular disease: Secondary | ICD-10-CM

## 2021-10-24 NOTE — Therapy (Signed)
[place]. [place], [place], [address] Phone: [phone]   Fax:  [phone]  Physical Therapy Treatment  Patient Details  Name: Karen Jackson MRN: [id number] Date of Birth: 1940/12/15 Referring Provider (PT): Hensel   Encounter Date: 10/24/2021   PT End of Session - 10/24/21 1307     Visit Number 22    Date for PT Re-Evaluation 11/01/21    Authorization Type Medicare    PT Start Time 1055    PT Stop Time 1139    PT Time Calculation (min) 44 min    Activity Tolerance Patient tolerated treatment well;Patient limited by fatigue    Behavior During Therapy [facility] for tasks assessed/performed             Past Medical History:  Diagnosis Date   Acute cystitis without hematuria    Acute diastolic CHF (congestive heart failure) (HCC)    Arthritis    Dyspnea    Dysrhythmia    Fever of unknown origin 03/19/2017   Hyperlipidemia    Hypertension    denies at preop   Multifocal pneumonia    Neuromuscular disorder ([place])    neuropathy left arm and foot   Osteopenia    Paralysis ([place])    partial left side from CVA    Persistent atrial fibrillation (HCC)    PONV (postoperative nausea and vomiting)    Pre-diabetes    Stroke [facility]) 2013   hemmorahgic    Past Surgical History:  Procedure Laterality Date   ANKLE SURGERY     APPENDECTOMY     CHOLECYSTECTOMY     HERNIA REPAIR     Esophagus   JOINT REPLACEMENT     total- right partial- left   MASTECTOMY PARTIAL / LUMPECTOMY  2012   left   ORIF ANKLE FRACTURE Left 07/20/2018   Procedure: OPEN REDUCTION INTERNAL FIXATION (ORIF) ANKLE FRACTURE;  Surgeon: Wylene Simmer, MD;  Location: [place];  Service: Orthopedics;  Laterality: Left;   TOTAL KNEE ARTHROPLASTY Left 01/27/2019   Procedure: TOTAL KNEE ARTHROPLASTY;  Surgeon: Vickey Huger, MD;  [facility];  Service: Orthopedics;  Laterality: Left;    There were no vitals filed for this visit.   Subjective  Assessment - 10/24/21 1112     Subjective Patient reports that she was told not to take lasix because she was dehydratred, she reports that she started it back today, reports that she is a little more short of breath today and her ankles are swollen.    Currently in Pain? Yes    Pain Score 3     Pain Location Ankle    Pain Orientation Left    Pain Descriptors / Indicators Sore    Aggravating Factors  walking                               [facility] Adult PT Treatment/Exercise - 10/24/21 0001       Ambulation/Gait   Gait Comments 120' x 1, 140' x 1 with FWW and CGA      Knee/Hip Exercises: Aerobic   Nustep level 5 x 7 minutes 500 steps      Knee/Hip Exercises: Seated   Long Arc Quad Strengthening;Both;10 reps;Weights;3 sets    [employer] Weight 5 lbs.    Other Seated Knee/Hip Exercises green tband ankle motions    Marching Both;2 sets;10 reps    Marching Weights 5 lbs.  Hamstring Curl 20 reps    Hamstring Limitations green tband      Manual Therapy   Manual therapy comments STM to the left upper trap    Passive ROM left ankle for pain                       PT Short Term Goals - 09/15/21 1145       PT SHORT TERM GOAL #1   Title independent with initial HEP    Status Partially Met               PT Long Term Goals - 10/24/21 1310       PT LONG TERM GOAL #1   Title walk with FWW x 150 feet without rest with supervision only    Status On-going                   Plan - 10/24/21 1308     Clinical Impression Statement Patient was taken off lasix for the weekend, she reports some increased chest congestion and some swelling of the ankles.  She really had trouble with her breathing today during walking.  Reports that she started taking it again this morning, had some left ankle pain, after doing some mobility with it , this seems to ehlp it, she is still doing well with the transfers, her son commented on this    PT Next  Visit Plan continue to work on her ability to ambulate safely             Patient will benefit from skilled therapeutic intervention in order to improve the following deficits and impairments:  Abnormal gait, Decreased range of motion, Difficulty walking, Impaired UE functional use, Increased muscle spasms, Cardiopulmonary status limiting activity, Decreased endurance, Decreased activity tolerance, Pain, Decreased balance, Decreased mobility, Decreased strength  Visit Diagnosis: Muscle weakness (generalized)  Pain in left ankle and joints of left foot  Late effects of CVA (cerebrovascular accident)  Left shoulder pain, unspecified chronicity  Difficulty in walking, not elsewhere classified  Hemiplegia and hemiparesis following cerebral infarction affecting left non-dominant side [facility])  Ataxia     Problem List Patient Active Problem List   Diagnosis Date Noted   Secondary hypercoagulable state ([place]) 03/16/2021   Dizziness 03/10/2021   Orthostatic hypotension 10/15/2020   Presbycusis of both ears 03/08/2020   Mixed stress and urge urinary incontinence 12/02/2019   Macrocytosis 12/01/2019   Nutritional anemia 12/01/2019   S/P total knee replacement 01/27/2019   Recurrent left knee instability 07/05/2018   Respiratory failure with hypoxia ([place]) 08/30/2017   Hypoxemia    Heart failure with preserved ejection fraction (HCC), Grade 3 diastolic dysfunction 93/26/7124   PAF (paroxysmal atrial fibrillation) ([place])    Dyspnea 03/19/2017   Encounter for preventive health examination 02/17/2016   Sensorineural hearing loss (SNHL), bilateral 01/26/2016   Morbid obesity ([place]) 06/17/2015   Hypomagnesemia 04/24/2014   Hemiparesis affecting left side as late effect of cerebrovascular accident ([place]) 04/24/2014   Nontraumatic cerebral hemorrhage ([place]) 04/30/2012   DM (diabetes mellitus) with complications ([place]) 58/04/9832   OSTEOPENIA 01/21/2009   UNSPECIFIED VITAMIN D DEFICIENCY  11/19/2007   HYPERCHOLESTEROLEMIA 10/25/2006   GASTROESOPHAGEAL REFLUX, NO ESOPHAGITIS 10/25/2006   DIVERTICULOSIS OF COLON 10/25/2006   Osteoarthritis 10/25/2006   CERVICAL SPINE DISORDER, NOS 10/25/2006    Sumner Boast, PT 10/24/2021, 1:12 PM  [place]. [place], [place], [address] Phone: [phone]  Fax:  [phone]  Name: Karen Jackson MRN: [id number] Date of Birth: 1941/05/12

## 2021-10-27 DIAGNOSIS — I2510 Atherosclerotic heart disease of native coronary artery without angina pectoris: Secondary | ICD-10-CM | POA: Diagnosis not present

## 2021-10-27 DIAGNOSIS — I4819 Other persistent atrial fibrillation: Secondary | ICD-10-CM | POA: Diagnosis not present

## 2021-10-27 DIAGNOSIS — I5032 Chronic diastolic (congestive) heart failure: Secondary | ICD-10-CM | POA: Diagnosis not present

## 2021-10-27 DIAGNOSIS — E785 Hyperlipidemia, unspecified: Secondary | ICD-10-CM | POA: Diagnosis not present

## 2021-10-27 LAB — BASIC METABOLIC PANEL
BUN/Creatinine Ratio: 26 (ref 12–28)
BUN: 15 mg/dL (ref 8–27)
CO2: 28 mmol/L (ref 20–29)
Calcium: 9.5 mg/dL (ref 8.7–10.3)
Chloride: 93 mmol/L — ABNORMAL LOW (ref 96–106)
Creatinine, Ser: 0.57 mg/dL (ref 0.57–1.00)
Glucose: 130 mg/dL — ABNORMAL HIGH (ref 70–99)
Potassium: 4.4 mmol/L (ref 3.5–5.2)
Sodium: 135 mmol/L (ref 134–144)
eGFR: 91 mL/min/{1.73_m2} (ref >59)

## 2021-11-01 ENCOUNTER — Other Ambulatory Visit: Payer: Self-pay

## 2021-11-01 ENCOUNTER — Encounter: Payer: Self-pay | Admitting: Physical Therapy

## 2021-11-01 ENCOUNTER — Ambulatory Visit: Payer: Medicare Other | Attending: Family Medicine | Admitting: Physical Therapy

## 2021-11-01 DIAGNOSIS — I6990 Unspecified sequelae of unspecified cerebrovascular disease: Secondary | ICD-10-CM | POA: Insufficient documentation

## 2021-11-01 DIAGNOSIS — M25512 Pain in left shoulder: Secondary | ICD-10-CM | POA: Diagnosis not present

## 2021-11-01 DIAGNOSIS — R262 Difficulty in walking, not elsewhere classified: Secondary | ICD-10-CM | POA: Insufficient documentation

## 2021-11-01 DIAGNOSIS — M6281 Muscle weakness (generalized): Secondary | ICD-10-CM | POA: Diagnosis not present

## 2021-11-01 DIAGNOSIS — I69354 Hemiplegia and hemiparesis following cerebral infarction affecting left non-dominant side: Secondary | ICD-10-CM | POA: Insufficient documentation

## 2021-11-01 DIAGNOSIS — R270 Ataxia, unspecified: Secondary | ICD-10-CM | POA: Insufficient documentation

## 2021-11-01 DIAGNOSIS — M25572 Pain in left ankle and joints of left foot: Secondary | ICD-10-CM | POA: Diagnosis not present

## 2021-11-01 NOTE — Therapy (Signed)
[place] ?[place] ?[address] ?[place], [place], [address] ?Phone: [phone]   Fax:  [phone] ? ?Physical Therapy Treatment ? ?Patient Details  ?Name: Karen Jackson ?MRN: [id number] ?Date of Birth: [birth year] ?Referring Provider (PT): Hensel ? ? ?Encounter Date: 11/01/2021 ? ? PT End of Session - 11/01/21 1056   ? ? Visit Number 23   ? Date for PT Re-Evaluation 12/04/21   ? Authorization Type Medicare   ? PT Start Time 1015   ? PT Stop Time 1056   ? PT Time Calculation (min) 41 min   ? Activity Tolerance Patient tolerated treatment well;Patient limited by fatigue   ? Behavior During Therapy [facility] for tasks assessed/performed   ? ?  ?  ? ?  ? ? ?Past Medical History:  ?Diagnosis Date  ? Acute cystitis without hematuria   ? Acute diastolic CHF (congestive heart failure) ([place])   ? Arthritis   ? Dyspnea   ? Dysrhythmia   ? Fever of unknown origin 03/19/2017  ? Hyperlipidemia   ? Hypertension   ? denies at preop  ? Multifocal pneumonia   ? Neuromuscular disorder ([place])   ? neuropathy left arm and foot  ? Osteopenia   ? Paralysis ([place])   ? partial left side from CVA   ? Persistent atrial fibrillation ([place])   ? PONV (postoperative nausea and vomiting)   ? Pre-diabetes   ? Stroke [facility]) 2013  ? hemmorahgic  ? ? ?Past Surgical History:  ?Procedure Laterality Date  ? ANKLE SURGERY    ? APPENDECTOMY    ? CHOLECYSTECTOMY    ? HERNIA REPAIR    ? Esophagus  ? JOINT REPLACEMENT    ? total- right partial- left  ? MASTECTOMY PARTIAL / LUMPECTOMY  2012  ? left  ? ORIF ANKLE FRACTURE Left 07/20/2018  ? Procedure: OPEN REDUCTION INTERNAL FIXATION (ORIF) ANKLE FRACTURE;  Surgeon: Wylene Simmer, MD;  Location: [place];  Service: Orthopedics;  Laterality: Left;  ? TOTAL KNEE ARTHROPLASTY Left 01/27/2019  ? Procedure: TOTAL KNEE ARTHROPLASTY;  Surgeon: Vickey Huger, MD;  [facility];  Service: Orthopedics;  Laterality: Left;  ? ? ?There were no vitals filed for this visit. ? ? Subjective  Assessment - 11/01/21 1013   ? ? Subjective Doing well, some pain in ankle and shoulder   ? Currently in Pain? Yes   ? Pain Score 3    shoulder and ankle  ? ?  ?  ? ?  ? ? ? ? ? ? ? ? ? ? ? ? ? ? ? ? ? ? ? ? [place] Adult PT Treatment/Exercise - 11/01/21 0001   ? ?  ? Ambulation/Gait  ? Gait Comments 120' x 1, 140' x 1, 120'x1 with FWW and CGA   ?  ? Knee/Hip Exercises: Aerobic  ? Nustep level 5 x 7 minutes 500 steps   ?  ? Knee/Hip Exercises: Standing  ? Other Standing Knee Exercises 6'' & 4'' 2lb x5 each   ?  ? Knee/Hip Exercises: Seated  ? Long Arc Quad Strengthening;Both;10 reps;Weights;3 sets   ? Long Arc Quad Weight 5 lbs.   ? Marching Both;2 sets;10 reps   ? Marching Weights 5 lbs.   ? Hamstring Curl 2 sets;15 reps;Strengthening   ? Hamstring Limitations green tband   ?  ? Manual Therapy  ? Manual therapy comments STM to the left upper trap   ? Passive ROM left ankle for pain   ? ?  ?  ? ?  ? ? ? ? ? ? ? ? ? ? ? ?  PT Short Term Goals - 11/01/21 1056   ? ?  ? PT SHORT TERM GOAL #1  ? Title independent with initial HEP   ? Status Achieved   ? ?  ?  ? ?  ? ? ? ? PT Long Term Goals - 11/01/21 1056   ? ?  ? PT LONG TERM GOAL #1  ? Title walk with FWW x 150 feet without rest with supervision only   ? Status On-going   ?  ? PT LONG TERM GOAL #2  ? Title increase left knee strength to 4/5   ? Status Partially Met   ?  ? PT LONG TERM GOAL #3  ? Title decrease TUG to 33 seconds   ? Status On-going   ?  ? PT LONG TERM GOAL #4  ? Title transfer independently with set up   ? Status Partially Met   ?  ? PT LONG TERM GOAL #5  ? Title report pain in the shoulder and ankle 50% less   ? Status On-going   ? ?  ?  ? ?  ? ? ? ? ? ? ? ? Plan - 11/01/21 1057   ? ? Clinical Impression Statement L ankle and shoulder pain reported upon entering clinic. She did well showing improved gait speed, but with increase fatigue. increase rest needed between each gait trial. Some difficulty with alt toe taps using weigh and six inch box, no issues  with the four inch box. She did really well with transfers showing improved safety.   ? Stability/Clinical Decision Making Stable/Uncomplicated   ? Rehab Potential Good   ? PT Frequency 2x / week   ? PT Duration 12 weeks   ? PT Treatment/Interventions ADLs/Self Care Home Management;Electrical Stimulation;Moist Heat;Gait training;Functional mobility training;Therapeutic activities;Therapeutic exercise;Balance training;Neuromuscular re-education;Patient/family education;Manual techniques   ? PT Next Visit Plan continue to work on her ability to ambulate safely   ? ?  ?  ? ?  ? ? ?Patient will benefit from skilled therapeutic intervention in order to improve the following deficits and impairments:  Abnormal gait, Decreased range of motion, Difficulty walking, Impaired UE functional use, Increased muscle spasms, Cardiopulmonary status limiting activity, Decreased endurance, Decreased activity tolerance, Pain, Decreased balance, Decreased mobility, Decreased strength ? ?Visit Diagnosis: ?Muscle weakness (generalized) - Plan: PT plan of care cert/re-cert ? ?Pain in left ankle and joints of left foot - Plan: PT plan of care cert/re-cert ? ?Late effects of CVA (cerebrovascular accident) - Plan: PT plan of care cert/re-cert ? ?Difficulty in walking, not elsewhere classified - Plan: PT plan of care cert/re-cert ? ? ? ? ?Problem List ?Patient Active Problem List  ? Diagnosis Date Noted  ? Secondary hypercoagulable state ([place]) 03/16/2021  ? Dizziness 03/10/2021  ? Orthostatic hypotension 10/15/2020  ? Presbycusis of both ears 03/08/2020  ? Mixed stress and urge urinary incontinence 12/02/2019  ? Macrocytosis 12/01/2019  ? Nutritional anemia 12/01/2019  ? S/P total knee replacement 01/27/2019  ? Recurrent left knee instability 07/05/2018  ? Respiratory failure with hypoxia ([place]) 08/30/2017  ? Hypoxemia   ? Heart failure with preserved ejection fraction ([place]), Grade 3 diastolic dysfunction 94/85/4627  ? PAF (paroxysmal atrial  fibrillation) ([place])   ? Dyspnea 03/19/2017  ? Encounter for preventive health examination 02/17/2016  ? Sensorineural hearing loss (SNHL), bilateral 01/26/2016  ? Morbid obesity ([place]) 06/17/2015  ? Hypomagnesemia 04/24/2014  ? Hemiparesis affecting left side as late effect of cerebrovascular accident ([place]) 04/24/2014  ? Nontraumatic cerebral  hemorrhage ([place]) 04/30/2012  ? DM (diabetes mellitus) with complications ([place]) 40/98/1191  ? OSTEOPENIA 01/21/2009  ? UNSPECIFIED VITAMIN D DEFICIENCY 11/19/2007  ? HYPERCHOLESTEROLEMIA 10/25/2006  ? GASTROESOPHAGEAL REFLUX, NO ESOPHAGITIS 10/25/2006  ? DIVERTICULOSIS OF COLON 10/25/2006  ? Osteoarthritis 10/25/2006  ? CERVICAL SPINE DISORDER, NOS 10/25/2006  ? ? Sumner Boast, PT ?11/01/2021, 6:50 PM ? ?[place] ?[address] ?[place], [place], [address] ?Phone: [phone]   Fax:  [phone] ? ?Name: ZULEYKA KLOC ?MRN: [id number] ?Date of Birth: [birth year] ? ? ? ?

## 2021-11-02 ENCOUNTER — Telehealth: Payer: Self-pay

## 2021-11-02 NOTE — Telephone Encounter (Signed)
Called patient regarding results . Patient had understanding of reuslts. ?

## 2021-11-03 ENCOUNTER — Ambulatory Visit: Payer: Medicare Other | Admitting: Physical Therapy

## 2021-11-08 ENCOUNTER — Telehealth: Payer: Self-pay

## 2021-11-08 ENCOUNTER — Ambulatory Visit: Payer: Medicare Other | Admitting: Physical Therapy

## 2021-11-08 NOTE — Telephone Encounter (Signed)
Informed pt her ozempic ('1mg'$  weekly) from [facility] is ready for pickup. 4 month supply. Pt has an appt this Thursday and is wondering if she will be increasing her dose since shes been on the '1mg'$  weekly for a while now. Let pt know I would leave a message for her pcp. ? ?If dose increases, let me know & I will send a new RX to [facility] for her next shipment ? ?Thanks! ?

## 2021-11-08 NOTE — Telephone Encounter (Signed)
Noted and agree. 

## 2021-11-09 ENCOUNTER — Ambulatory Visit (INDEPENDENT_AMBULATORY_CARE_PROVIDER_SITE_OTHER): Payer: Medicare Other | Admitting: Pulmonary Disease

## 2021-11-09 ENCOUNTER — Other Ambulatory Visit: Payer: Self-pay

## 2021-11-09 ENCOUNTER — Encounter: Payer: Self-pay | Admitting: Pulmonary Disease

## 2021-11-09 VITALS — BP 116/76 | HR 71 | Ht <= 58 in | Wt 167.0 lb

## 2021-11-09 DIAGNOSIS — R0609 Other forms of dyspnea: Secondary | ICD-10-CM | POA: Diagnosis not present

## 2021-11-09 DIAGNOSIS — I5032 Chronic diastolic (congestive) heart failure: Secondary | ICD-10-CM

## 2021-11-09 DIAGNOSIS — R0602 Shortness of breath: Secondary | ICD-10-CM | POA: Diagnosis not present

## 2021-11-09 MED ORDER — ALBUTEROL SULFATE HFA 108 (90 BASE) MCG/ACT IN AERS: 2.0000 | INHALATION_SPRAY | Freq: Four times a day (QID) | RESPIRATORY_TRACT | 2 refills | Status: DC | PRN

## 2021-11-09 NOTE — Progress Notes (Signed)
? ?Synopsis: Referred in March 2023 for shortness of breath by Karen Presume, PA ? ?Subjective:  ? ?PATIENT ID: Karen Jackson GENDER: female DOB: 01/29/1941, MRN: [id number] ? ?HPI ? ?Chief Complaint  ?Patient presents with  ? Consult  ?  Referred by cardiologist for increased dyspnea and pulmonary HTN. Has not started any medication yet.   ? ?Karen Jackson is an 81 year old woman, former smoker with history of diastolic heart failure, hypertension, atrial fibrillation, and CVA who is referred to pulmonary clinic for shortness of breath.  ? ?Patient reports increasing shortness of breath over the last year as her walk distance during physical therapy sessions has decreased.  She has been referred by her cardiology team for concern of pulmonary hypertension. ? ?She had CT coronary scan 05/25/2021 which showed enlarged pulmonary artery.  Echocardiogram 06/03/2021 showed LVEF 50 to 55%.  Diastolic function unable to be evaluated.  RV systolic function and size are normal.  Mildly elevated pulmonary artery systolic pressure estimated at 42.8 mmHg.  Left atrium mildly dilated.  Right atrium mildly dilated.  Mild mitral valve regurgitation.  Mild to moderate tricuspid valve regurgitation. ? ?Nuclear medicine myocardial perfusion scan 08/09/2021 showed a normal study with low risk. ? ?Reports wheezing with exertion during her physical therapy sessions.  She does have cough with phlegm production on a daily basis.  She denies any sinus congestion or postnasal drainage.  She denies any seasonal allergies.  She denies any issues with GERD. ? ?She reports intermittent lower extremity edema bilaterally.  She is currently taking Lasix per her cardiology team 80 mg daily.  She denies any PND or orthopnea.  She is sleeping upright in a hospital bed at night.  She is unaware of any snoring or apneic events. ? ?She is a former smoker.  She quit 40 years ago.  She smoked 0.25 to 0.50 packs/day for 26 to 27 years.  She has significant  history for secondhand smoke in childhood.  She is a retired Education officer, museum.  Her sister has COPD.  No lung cancer history in the family. ? ?She is accompanied by her niece on today's visit. ? ?Past Medical History:  ?Diagnosis Date  ? Acute cystitis without hematuria   ? Acute diastolic CHF (congestive heart failure) ([place])   ? Arthritis   ? Dyspnea   ? Dysrhythmia   ? Fever of unknown origin 03/19/2017  ? Hyperlipidemia   ? Hypertension   ? denies at preop  ? Multifocal pneumonia   ? Neuromuscular disorder ([place])   ? neuropathy left arm and foot  ? Osteopenia   ? Paralysis ([place])   ? partial left side from CVA   ? Persistent atrial fibrillation ([place])   ? PONV (postoperative nausea and vomiting)   ? Pre-diabetes   ? Stroke [facility]) 2013  ? hemmorahgic  ?  ? ?Family History  ?Problem Relation Age of Onset  ? Diabetes Mother   ? Transient ischemic attack Father   ? Dementia Father   ?  ? ?Social History  ? ?Socioeconomic History  ? Marital status: Widowed  ?  Spouse name: Not on file  ? Number of children: 2  ? Years of education: college  ? Highest education level: Not on file  ?Occupational History  ? Occupation: RetiredGames developer  ?Tobacco Use  ? Smoking status: Former  ?  Packs/day: 0.50  ?  Years: 23.00  ?  Pack years: 11.50  ?  Types: Cigarettes  ?  Start  date: 08/28/1957  ?  Quit date: 03/09/1981  ?  Years since quitting: 40.6  ? Smokeless tobacco: Never  ?Vaping Use  ? Vaping Use: Never used  ?Substance and Sexual Activity  ? Alcohol use: Yes  ?  Alcohol/week: 16.0 standard drinks  ?  Types: 2 Glasses of wine, 14 Standard drinks or equivalent per week  ?  Comment: daily one glass  ? Drug use: No  ? Sexual activity: Not Currently  ?Other Topics Concern  ? Not on file  ?Social History Narrative  ? ** Merged History Encounter ** Health Care POA: son, Charly RayEmergency Contact: Ann and Roosvelt Harps, niece & nephew (c) [phone] (h) [phone] of Life Plan: Who lives with you: selfAny pets: noneDiet: Pt has a varied diet of  protein, vegetables and limits "white" foods.Exercise: Pt does water aerobics 3x week, golf 2x weekSeatbelts: Pt reports wearing seatbelt when in vehicle. Sun Exposure/Protection: Pt reports wearing sun screenHobbies: golfing, swimming   ? Lives alone (has caregivers coming in)  ? R handed   ? ?Social Determinants of Health  ? ?Financial Resource Strain: Not on file  ?Food Insecurity: Not on file  ?Transportation Needs: Not on file  ?Physical Activity: Not on file  ?Stress: Not on file  ?Social Connections: Not on file  ?Intimate Partner Violence: Not on file  ?  ? ?Allergies  ?Allergen Reactions  ? Codeine Phosphate Nausea And Vomiting  ? Simvastatin Other (See Comments)  ?  Myalgia  Changed brand ok now  ?  ? ?Outpatient Medications Prior to Visit  ?Medication Sig Dispense Refill  ? apixaban (ELIQUIS) 5 MG TABS tablet Take 1 tablet (5 mg total) by mouth 2 (two) times daily. 60 tablet 11  ? atorvastatin (LIPITOR) 40 MG tablet Take 1 tablet (40 mg total) by mouth daily. 90 tablet 3  ? Calcium Carb-Cholecalciferol (CALCIUM CARBONATE-VITAMIN D3) 600-400 MG-UNIT TABS Take 1 tablet by mouth daily.    ? celecoxib (CELEBREX) 200 MG capsule TAKE 1 CAPSULE BY MOUTH DAILY AS NEEDED FOR ARTHRITIS OR PAIN 90 capsule 3  ? diclofenac Sodium (VOLTAREN) 1 % GEL APPLY 2 GRAMS EXTERNALLY TO THE AFFECTED AREA FOUR TIMES DAILY    ? ergocalciferol (VITAMIN D2) 1.25 MG (50000 UT) capsule 50,000 unit    ? furosemide (LASIX) 80 MG tablet TAKE 1 TABLET BY MOUTH  DAILY 90 tablet 3  ? gabapentin (NEURONTIN) 100 MG capsule TAKE 1 CAPSULE BY MOUTH 3  TIMES DAILY 270 capsule 3  ? hydrocortisone 2.5 % cream Apply topically 2 (two) times daily as needed.    ? lidocaine (LIDODERM) 5 % Place 1 patch onto the skin daily. Remove & Discard patch within 12 hours or as directed by MD 30 patch 2  ? MAGNESIUM-OXIDE 400 (240 Mg) MG tablet TAKE 1 TABLET BY MOUTH DAILY 90 tablet 3  ? metoprolol succinate (TOPROL-XL) 50 MG 24 hr tablet Take 1 tablet (50 mg  total) by mouth daily. Take with or immediately following a meal. 90 tablet 3  ? omeprazole (PRILOSEC) 20 MG capsule TAKE 1 CAPSULE BY MOUTH  DAILY 90 capsule 3  ? Semaglutide, 1 MG/DOSE, (OZEMPIC, 1 MG/DOSE,) 4 MG/3ML SOPN Inject 1 mg into the skin once a week.    ? traZODone (DESYREL) 50 MG tablet TAKE 1 TABLET BY MOUTH AT  BEDTIME 90 tablet 3  ? TURMERIC PO Take 1 capsule by mouth daily.    ? albuterol (VENTOLIN HFA) 108 (90 Base) MCG/ACT inhaler Inhale 2 puffs into  the lungs every 6 (six) hours as needed for wheezing or shortness of breath. 18 g 2  ? fluocinonide (LIDEX) 0.05 % external solution APPLY 1 ML TOPICALLY TO THE SCALP TWICE DAILY (Patient not taking: Reported on 10/20/2021)    ? Multiple Vitamins-Minerals (HAIR/SKIN/NAILS) CAPS Take 3 capsules by mouth daily. (Patient not taking: Reported on 10/20/2021)    ? ?No facility-administered medications prior to visit.  ? ?Review of Systems  ?Constitutional:  Negative for chills, fever, malaise/fatigue and weight loss.  ?HENT:  Negative for congestion, sinus pain and sore throat.   ?Eyes: Negative.   ?Respiratory:  Positive for cough, sputum production, shortness of breath and wheezing. Negative for hemoptysis.   ?Cardiovascular:  Positive for leg swelling. Negative for chest pain, palpitations, orthopnea and claudication.  ?Gastrointestinal:  Negative for abdominal pain, heartburn, nausea and vomiting.  ?Genitourinary: Negative.   ?Musculoskeletal:  Positive for joint pain. Negative for myalgias.  ?Skin:  Negative for rash.  ?Neurological:  Negative for weakness.  ?Endo/Heme/Allergies: Negative.   ?Psychiatric/Behavioral: Negative.    ? ?Objective:  ? ?Vitals:  ? 11/09/21 1103  ?BP: 116/76  ?Pulse: 71  ?SpO2: 96%  ?Weight: 167 lb (75.8 kg)  ?Height: '4\' 10"'$  (1.473 m)  ? ?Physical Exam ?Constitutional:   ?   General: She is not in acute distress. ?   Appearance: She is not ill-appearing.  ?HENT:  ?   Head: Normocephalic and atraumatic.  ?Eyes:  ?   General: No  scleral icterus. ?   Conjunctiva/sclera: Conjunctivae normal.  ?   Pupils: Pupils are equal, round, and reactive to light.  ?Cardiovascular:  ?   Rate and Rhythm: Normal rate and regular rhythm.  ?   Pulses

## 2021-11-09 NOTE — Patient Instructions (Addendum)
We will order further testing to check for pulmonary hypertension and obstructive lung disease.  ? ?We will check an overnight oximetry test. ? ?We will schedule you for breathing tests at the earliest possible time.  ? ?Use albuterol inhaler 1-2 puffs every 4-6 hours as needed. Recommend using prior to your physical therapy sessions to determine if you notice less shortness of breath and wheezing during your work out.  ? ?Follow up in 2 months.  ? ?  ? ? ?

## 2021-11-10 ENCOUNTER — Ambulatory Visit: Payer: Medicare Other | Admitting: Physical Therapy

## 2021-11-10 ENCOUNTER — Ambulatory Visit (INDEPENDENT_AMBULATORY_CARE_PROVIDER_SITE_OTHER): Payer: Medicare Other | Admitting: Family Medicine

## 2021-11-10 ENCOUNTER — Other Ambulatory Visit: Payer: Self-pay

## 2021-11-10 ENCOUNTER — Encounter: Payer: Self-pay | Admitting: Family Medicine

## 2021-11-10 DIAGNOSIS — Z96652 Presence of left artificial knee joint: Secondary | ICD-10-CM | POA: Diagnosis not present

## 2021-11-10 DIAGNOSIS — I2510 Atherosclerotic heart disease of native coronary artery without angina pectoris: Secondary | ICD-10-CM

## 2021-11-10 DIAGNOSIS — R0609 Other forms of dyspnea: Secondary | ICD-10-CM

## 2021-11-10 DIAGNOSIS — I5032 Chronic diastolic (congestive) heart failure: Secondary | ICD-10-CM

## 2021-11-10 DIAGNOSIS — I69354 Hemiplegia and hemiparesis following cerebral infarction affecting left non-dominant side: Secondary | ICD-10-CM

## 2021-11-10 NOTE — Assessment & Plan Note (Signed)
Part of her DOE may be related to the extra effort required for her to walk.   ?

## 2021-11-10 NOTE — Assessment & Plan Note (Addendum)
Likely multifactorial with diff including ?Left heart failure (HFpEF)) ?Right heart failure from pulmonary hypertension ?Deconditioning with the added problem of increased work of ambulation. ? ?Urged her to get PFTs and overnight pulse ox per pulm and FU with cards for possible cath ?

## 2021-11-10 NOTE — Assessment & Plan Note (Signed)
She is above my best guess at her dry weight=160.  See AVS for lasix dosing. ?

## 2021-11-10 NOTE — Patient Instructions (Signed)
For the total body swelling, my best guess for the best weight (dry weight) for you is 160 lbs.  As long as your weight is above 163.  Take the lasix twice a day.  If your weight is between 158 and 162, take lasix once a day.  If your weight is below 158, don't take any lasix that day.   ?Some of the swelling of your left leg may be coming from the knee.  Please see Dr. Lorre Nick and see if he thinks a cortisone injection might get you back in therapy sooner. ?OK to use the oxygen concentrator at night and make note about how you feel using it.  Do not use the oxygen concentrator on the night you do the test. ?It sounds like they have the right tests lined up (finally) to get some answers about the heart and lungs.  I would go ahead with the catheterization if that is what they recommend. ?See me in about a month for your regular check up and I want to see that you are making progress on this problem.   ?

## 2021-11-10 NOTE — Progress Notes (Signed)
? ? ?  SUBJECTIVE:  ? ?CHIEF COMPLAINT / HPI:  ? ?Dyspnea ?Long and winding story.  Has DOE for months.  First seen be cards and found to have echo with mild decrease in LV EF and the suggestion of pulmonary hypertension.  Low risk stress test.  Referred to pulm, which through some glitch that I do not understand, took months.  Seen by pulm yesterday.  Pulm note has a differential with right heart failure at the top.  Also ordered PFTs (remote hx of smoking) and overnight pulse ox (concern for nighttime desats + OSA).  Thinks she may need a right heart cath. ? ?Meanwhile, she has had acute worsening of DOE and leg swelling Left>right.  She feels knee pain.  The knee pain, leg swelling and worsening DOE have prevented her from participating in PT.   ? ?Weight at last PT when she had "no swelling" was 160.  She routinely wears pulse ox while participating in PT and no desats.  She was on home O2 for a short period of time after a pneumonia two years ago.  She still has an O2 concentrator at home.   ? ?She is status post two remote knee surgeries on left knee.  Per patient, first was a partial knee replacement and second was a total knee replacement.  No known trauma to that knee.  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   ? ? ? ?OBJECTIVE:  ? ?BP (!) 126/58   Pulse (!) 58   Ht '4\' 10"'$  (1.473 m)   Wt 166 lb (75.3 kg)   SpO2 96%   BMI 34.69 kg/m?   ?Lungs, bibasilar crackles ?Cardiac RRR without m or g ?Ext seems to have a moderate to large left knee effusion and 2+ bilateral ankle edema. ? ?ASSESSMENT/PLAN:  ? ?Hemiparesis affecting left side as late effect of cerebrovascular accident [facility]) ?Part of her DOE may be related to the extra effort required for her to walk.   ? ?Dyspnea ?Likely multifactorial with diff including ?Left heart failure (HFpEF)) ?Right heart failure from pulmonary hypertension ?Deconditioning with the added  problem of increased work of ambulation. ? ?Urged her to get PFTs and overnight pulse ox per pulm and FU with cards for possible cath ? ?Heart failure with preserved ejection fraction (HCC), Grade 3 diastolic dysfunction ?She is above my best guess at her dry weight=160.  See AVS for lasix dosing. ? ?S/P total knee replacement ?I think she has a left knee effusion which contributes to her leg swelling and inability to participate in PT.  I urged her to check with ortho to see if she would benefit from steroid injection of that knee.   ?  ? ? ?Zenia Resides, MD ?[place]  ?

## 2021-11-10 NOTE — Assessment & Plan Note (Signed)
I think she has a left knee effusion which contributes to her leg swelling and inability to participate in PT.  I urged her to check with ortho to see if she would benefit from steroid injection of that knee.   ?

## 2021-11-14 ENCOUNTER — Ambulatory Visit: Payer: Medicare Other | Admitting: Physical Therapy

## 2021-11-14 DIAGNOSIS — Z96652 Presence of left artificial knee joint: Secondary | ICD-10-CM | POA: Diagnosis not present

## 2021-11-14 DIAGNOSIS — M25462 Effusion, left knee: Secondary | ICD-10-CM | POA: Diagnosis not present

## 2021-11-17 ENCOUNTER — Ambulatory Visit: Payer: Medicare Other | Admitting: Physical Therapy

## 2021-11-21 ENCOUNTER — Encounter: Payer: Self-pay | Admitting: Physical Therapy

## 2021-11-21 ENCOUNTER — Other Ambulatory Visit: Payer: Self-pay

## 2021-11-21 ENCOUNTER — Ambulatory Visit: Payer: Medicare Other | Admitting: Physical Therapy

## 2021-11-21 DIAGNOSIS — M25572 Pain in left ankle and joints of left foot: Secondary | ICD-10-CM

## 2021-11-21 DIAGNOSIS — M6281 Muscle weakness (generalized): Secondary | ICD-10-CM | POA: Diagnosis not present

## 2021-11-21 DIAGNOSIS — I6990 Unspecified sequelae of unspecified cerebrovascular disease: Secondary | ICD-10-CM | POA: Diagnosis not present

## 2021-11-21 DIAGNOSIS — R262 Difficulty in walking, not elsewhere classified: Secondary | ICD-10-CM

## 2021-11-21 DIAGNOSIS — M25512 Pain in left shoulder: Secondary | ICD-10-CM | POA: Diagnosis not present

## 2021-11-21 DIAGNOSIS — R270 Ataxia, unspecified: Secondary | ICD-10-CM

## 2021-11-21 DIAGNOSIS — I69354 Hemiplegia and hemiparesis following cerebral infarction affecting left non-dominant side: Secondary | ICD-10-CM

## 2021-11-21 NOTE — Therapy (Signed)
[place] ?[place] [address] ?[place], [place], [address] ?Phone: [phone]   Fax:  [phone] ? ?Physical Therapy Treatment ? ?Patient Details  ?Name: Karen Jackson ?MRN: [id number] ?Date of Birth: 02/19/1941 ?Referring Provider (PT): Hensel ? ? ?Encounter Date: 11/21/2021 ? ? PT End of Session - 11/21/21 1141   ? ? Visit Number 24   ? Date for PT Re-Evaluation 12/04/21   ? Authorization Type Medicare   ? PT Start Time 1055   ? PT Stop Time 1140   ? PT Time Calculation (min) 45 min   ? Activity Tolerance Patient tolerated treatment well;Patient limited by fatigue;Patient limited by pain   ? Behavior During Therapy [facility] for tasks assessed/performed   ? ?  ?  ? ?  ? ? ?Past Medical History:  ?Diagnosis Date  ? Acute cystitis without hematuria   ? Acute diastolic CHF (congestive heart failure) ([place])   ? Arthritis   ? Dyspnea   ? Dysrhythmia   ? Fever of unknown origin 03/19/2017  ? Hyperlipidemia   ? Hypertension   ? denies at preop  ? Multifocal pneumonia   ? Neuromuscular disorder ([place])   ? neuropathy left arm and foot  ? Osteopenia   ? Paralysis ([place])   ? partial left side from CVA   ? Persistent atrial fibrillation ([place])   ? PONV (postoperative nausea and vomiting)   ? Pre-diabetes   ? Stroke [facility]) 2013  ? hemmorahgic  ? ? ?Past Surgical History:  ?Procedure Laterality Date  ? ANKLE SURGERY    ? APPENDECTOMY    ? CHOLECYSTECTOMY    ? HERNIA REPAIR    ? Esophagus  ? JOINT REPLACEMENT    ? total- right partial- left  ? MASTECTOMY PARTIAL / LUMPECTOMY  2012  ? left  ? ORIF ANKLE FRACTURE Left 07/20/2018  ? Procedure: OPEN REDUCTION INTERNAL FIXATION (ORIF) ANKLE FRACTURE;  Surgeon: Wylene Simmer, MD;  Location: [facility];  Service: Orthopedics;  Laterality: Left;  ? TOTAL KNEE ARTHROPLASTY Left 01/27/2019  ? Procedure: TOTAL KNEE ARTHROPLASTY;  Surgeon: Vickey Huger, MD;  [facility];  Service: Orthopedics;  Laterality: Left;  ? ? ?There were no vitals filed for this  visit. ? ? Subjective Assessment - 11/21/21 1100   ? ? Subjective Patient has not been in to see [place] in 2 weeks she reports left knee and ankle were hurting and the knee swelled up, she reports that she had it drained and had a cortisone injection   ? Currently in Pain? Yes   ? Pain Score 6    ? Pain Location Ankle   ? Pain Orientation Left   ? Pain Descriptors / Indicators Sore   ? ?  ?  ? ?  ? ? ? ? ? ? ? ? ? ? ? ? ? ? ? ? ? ? ? ? [place] Adult PT Treatment/Exercise - 11/21/21 0001   ? ?  ? Ambulation/Gait  ? Gait Comments 100' x 2 CGA   ?  ? High Level Balance  ? High Level Balance Comments standing weight shifts to get some WBing in the ankle and the knee on the left   ?  ? Knee/Hip Exercises: Stretches  ? Gastroc Stretch Left;3 reps;20 seconds   ?  ? Knee/Hip Exercises: Aerobic  ? Nustep level 5 x 7 minutes 500 steps   ?  ? Knee/Hip Exercises: Seated  ? Long Arc Quad Strengthening;Both;10 reps;Weights;3 sets   ?  Long Arc Quad Weight 3 lbs.   ? Other Seated Knee/Hip Exercises red tband ankle exercises   ? Hamstring Curl 2 sets;15 reps;Strengthening   ? Hamstring Limitations red   ?  ? Manual Therapy  ? Manual therapy comments STM to the left upper trap   ? Passive ROM left ankle for pain   ? ?  ?  ? ?  ? ? ? ? ? ? ? ? ? ? ? ? PT Short Term Goals - 11/01/21 1056   ? ?  ? PT SHORT TERM GOAL #1  ? Title independent with initial HEP   ? Status Achieved   ? ?  ?  ? ?  ? ? ? ? PT Long Term Goals - 11/21/21 1145   ? ?  ? PT LONG TERM GOAL #1  ? Title walk with FWW x 150 feet without rest with supervision only   ? Status On-going   ?  ? PT LONG TERM GOAL #2  ? Title increase left knee strength to 4/5   ? Status On-going   ?  ? PT LONG TERM GOAL #3  ? Title decrease TUG to 33 seconds   ? Status On-going   ?  ? PT LONG TERM GOAL #4  ? Title transfer independently with set up   ? Status On-going   ?  ? PT LONG TERM GOAL #5  ? Title report pain in the shoulder and ankle 50% less   ? Status On-going   ? ?  ?  ? ?   ? ? ? ? ? ? ? ? Plan - 11/21/21 1141   ? ? Clinical Impression Statement Patient missed the last two weeks, she reports a set back with left ankle and knee paina nd swelling, she is unsure of a cause.  She saw the orthopod and she had the knee drained and an injection of cortisone.  She is reporting that she feels better but is having left ankle pain.  I tried to back off some to help ease back in, she could not tolerate walking as well today due to the left ankle pain, it got better after the passive motions and the approximation   ? PT Next Visit Plan we had a set back, will try to resume our work to help her be more independent and functional   ? Consulted and Agree with Plan of Care Patient   ? ?  ?  ? ?  ? ? ?Patient will benefit from skilled therapeutic intervention in order to improve the following deficits and impairments:  Abnormal gait, Decreased range of motion, Difficulty walking, Impaired UE functional use, Increased muscle spasms, Cardiopulmonary status limiting activity, Decreased endurance, Decreased activity tolerance, Pain, Decreased balance, Decreased mobility, Decreased strength ? ?Visit Diagnosis: ?Muscle weakness (generalized) ? ?Pain in left ankle and joints of left foot ? ?Late effects of CVA (cerebrovascular accident) ? ?Difficulty in walking, not elsewhere classified ? ?Left shoulder pain, unspecified chronicity ? ?Hemiplegia and hemiparesis following cerebral infarction affecting left non-dominant side ([place]) ? ?Ataxia ? ? ? ? ?Problem List ?Patient Active Problem List  ? Diagnosis Date Noted  ? Secondary hypercoagulable state ([place]) 03/16/2021  ? Dizziness 03/10/2021  ? Orthostatic hypotension 10/15/2020  ? Presbycusis of both ears 03/08/2020  ? Mixed stress and urge urinary incontinence 12/02/2019  ? Macrocytosis 12/01/2019  ? Nutritional anemia 12/01/2019  ? S/P total knee replacement 01/27/2019  ? Recurrent left knee instability 07/05/2018  ? Respiratory failure  with hypoxia ([place])  08/30/2017  ? Hypoxemia   ? Heart failure with preserved ejection fraction ([place]), Grade 3 diastolic dysfunction 15/17/6160  ? PAF (paroxysmal atrial fibrillation) ([place])   ? Dyspnea 03/19/2017  ? Encounter for preventive health examination 02/17/2016  ? Sensorineural hearing loss (SNHL), bilateral 01/26/2016  ? Morbid obesity ([place]) 06/17/2015  ? Hypomagnesemia 04/24/2014  ? Hemiparesis affecting left side as late effect of cerebrovascular accident ([place]) 04/24/2014  ? Nontraumatic cerebral hemorrhage ([place]) 04/30/2012  ? DM (diabetes mellitus) with complications ([place]) 73/71/0626  ? OSTEOPENIA 01/21/2009  ? UNSPECIFIED VITAMIN D DEFICIENCY 11/19/2007  ? HYPERCHOLESTEROLEMIA 10/25/2006  ? GASTROESOPHAGEAL REFLUX, NO ESOPHAGITIS 10/25/2006  ? DIVERTICULOSIS OF COLON 10/25/2006  ? Osteoarthritis 10/25/2006  ? CERVICAL SPINE DISORDER, NOS 10/25/2006  ? ? Sumner Boast, PT ?11/21/2021, 11:46 AM ? ?[place] ?[place] ?[address] ?[place], [place], [address] ?Phone: [phone]   Fax:  [phone] ? ?Name: HASNA STEFANIK ?MRN: [id number] ?Date of Birth: Apr 04, 1941 ? ? ? ?

## 2021-11-22 DIAGNOSIS — R0683 Snoring: Secondary | ICD-10-CM | POA: Diagnosis not present

## 2021-11-22 DIAGNOSIS — G4730 Sleep apnea, unspecified: Secondary | ICD-10-CM | POA: Diagnosis not present

## 2021-11-24 ENCOUNTER — Encounter: Payer: Self-pay | Admitting: Physical Therapy

## 2021-11-24 ENCOUNTER — Ambulatory Visit: Payer: Medicare Other | Admitting: Physical Therapy

## 2021-11-24 DIAGNOSIS — M6281 Muscle weakness (generalized): Secondary | ICD-10-CM | POA: Diagnosis not present

## 2021-11-24 DIAGNOSIS — I69354 Hemiplegia and hemiparesis following cerebral infarction affecting left non-dominant side: Secondary | ICD-10-CM

## 2021-11-24 DIAGNOSIS — M25572 Pain in left ankle and joints of left foot: Secondary | ICD-10-CM | POA: Diagnosis not present

## 2021-11-24 DIAGNOSIS — R270 Ataxia, unspecified: Secondary | ICD-10-CM

## 2021-11-24 DIAGNOSIS — M25512 Pain in left shoulder: Secondary | ICD-10-CM

## 2021-11-24 DIAGNOSIS — I6990 Unspecified sequelae of unspecified cerebrovascular disease: Secondary | ICD-10-CM | POA: Diagnosis not present

## 2021-11-24 DIAGNOSIS — R262 Difficulty in walking, not elsewhere classified: Secondary | ICD-10-CM | POA: Diagnosis not present

## 2021-11-24 NOTE — Therapy (Signed)
[facility] ?[place] [address] ?[place], [place], [address] ?Phone: [phone]   Fax:  [phone] ? ?Physical Therapy Treatment ? ?Patient Details  ?Name: Karen Jackson ?MRN: [id number] ?Date of Birth: July 05, 1941 ?Referring Provider (PT): Hensel ? ? ?Encounter Date: 11/24/2021 ? ? PT End of Session - 11/24/21 1144   ? ? Visit Number 25   ? Date for PT Re-Evaluation 12/04/21   ? Authorization Type Medicare   ? PT Start Time 1047   ? PT Stop Time 1135   ? PT Time Calculation (min) 48 min   ? Activity Tolerance Patient tolerated treatment well;Patient limited by fatigue;Patient limited by pain   ? Behavior During Therapy [facility] for tasks assessed/performed   ? ?  ?  ? ?  ? ? ?Past Medical History:  ?Diagnosis Date  ? Acute cystitis without hematuria   ? Acute diastolic CHF (congestive heart failure) ([place])   ? Arthritis   ? Dyspnea   ? Dysrhythmia   ? Fever of unknown origin 03/19/2017  ? Hyperlipidemia   ? Hypertension   ? denies at preop  ? Multifocal pneumonia   ? Neuromuscular disorder ([place])   ? neuropathy left arm and foot  ? Osteopenia   ? Paralysis ([place])   ? partial left side from CVA   ? Persistent atrial fibrillation ([place])   ? PONV (postoperative nausea and vomiting)   ? Pre-diabetes   ? Stroke [facility]) 2013  ? hemmorahgic  ? ? ?Past Surgical History:  ?Procedure Laterality Date  ? ANKLE SURGERY    ? APPENDECTOMY    ? CHOLECYSTECTOMY    ? HERNIA REPAIR    ? Esophagus  ? JOINT REPLACEMENT    ? total- right partial- left  ? MASTECTOMY PARTIAL / LUMPECTOMY  2012  ? left  ? ORIF ANKLE FRACTURE Left 07/20/2018  ? Procedure: OPEN REDUCTION INTERNAL FIXATION (ORIF) ANKLE FRACTURE;  Surgeon: Wylene Simmer, MD;  Location: [facility];  Service: Orthopedics;  Laterality: Left;  ? TOTAL KNEE ARTHROPLASTY Left 01/27/2019  ? Procedure: TOTAL KNEE ARTHROPLASTY;  Surgeon: Vickey Huger, MD;  [facility];  Service: Orthopedics;  Laterality: Left;  ? ? ?There were no vitals filed for this  visit. ? ? Subjective Assessment - 11/24/21 1134   ? ? Subjective Patient still with some left knee pain and swelling, she reports difficulty walking at home.   ? Currently in Pain? Yes   ? Pain Score 5    ? Pain Location Knee   ? Pain Orientation Left   ? Pain Descriptors / Indicators Aching;Sore   ? ?  ?  ? ?  ? ? ? ? ? ? ? ? ? ? ? ? ? ? ? ? ? ? ? ? [facility] Adult PT Treatment/Exercise - 11/24/21 0001   ? ?  ? Ambulation/Gait  ? Gait Comments 100' x 2 CGA, very fatigued today, poor transfers when going to sitting   ?  ? Knee/Hip Exercises: Standing  ? Hip Abduction Both;1 set;10 reps   ? Abduction Limitations in pbars   ? Hip Extension Both;2 sets;10 reps   ? Extension Limitations in pbars   ?  ? Knee/Hip Exercises: Supine  ? Bridges with [employer] 10 reps;2 sets   ? Bridges with [employer] 10 reps;2 sets   ? Straight Leg Raises Left;3 sets;10 reps   ? Other Supine Knee/Hip Exercises crunches 2x10   ? ?  ?  ? ?  ? ? ? ? ? ? ? ? ? ? ? ?  PT Short Term Goals - 11/01/21 1056   ? ?  ? PT SHORT TERM GOAL #1  ? Title independent with initial HEP   ? Status Achieved   ? ?  ?  ? ?  ? ? ? ? PT Long Term Goals - 11/21/21 1145   ? ?  ? PT LONG TERM GOAL #1  ? Title walk with FWW x 150 feet without rest with supervision only   ? Status On-going   ?  ? PT LONG TERM GOAL #2  ? Title increase left knee strength to 4/5   ? Status On-going   ?  ? PT LONG TERM GOAL #3  ? Title decrease TUG to 33 seconds   ? Status On-going   ?  ? PT LONG TERM GOAL #4  ? Title transfer independently with set up   ? Status On-going   ?  ? PT LONG TERM GOAL #5  ? Title report pain in the shoulder and ankle 50% less   ? Status On-going   ? ?  ?  ? ?  ? ? ? ? ? ? ? ? Plan - 11/24/21 1145   ? ? Clinical Impression Statement Patient is dealing with left knee pain and swelling, she is struggling with her endurance, only able to go 2 x 100 feet.  due to fatigue.  She also c/o some soreness in the left ankle and knee with walking.  I did a little more core  acitvity for strength   ? PT Next Visit Plan we had a set back, will try to resume our work to help her be more independent and functional   ? Consulted and Agree with Plan of Care Patient   ? ?  ?  ? ?  ? ? ?Patient will benefit from skilled therapeutic intervention in order to improve the following deficits and impairments:  Abnormal gait, Decreased range of motion, Difficulty walking, Impaired UE functional use, Increased muscle spasms, Cardiopulmonary status limiting activity, Decreased endurance, Decreased activity tolerance, Pain, Decreased balance, Decreased mobility, Decreased strength ? ?Visit Diagnosis: ?Muscle weakness (generalized) ? ?Pain in left ankle and joints of left foot ? ?Late effects of CVA (cerebrovascular accident) ? ?Difficulty in walking, not elsewhere classified ? ?Left shoulder pain, unspecified chronicity ? ?Hemiplegia and hemiparesis following cerebral infarction affecting left non-dominant side ([place]) ? ?Ataxia ? ? ? ? ?Problem List ?Patient Active Problem List  ? Diagnosis Date Noted  ? Secondary hypercoagulable state ([place]) 03/16/2021  ? Dizziness 03/10/2021  ? Orthostatic hypotension 10/15/2020  ? Presbycusis of both ears 03/08/2020  ? Mixed stress and urge urinary incontinence 12/02/2019  ? Macrocytosis 12/01/2019  ? Nutritional anemia 12/01/2019  ? S/P total knee replacement 01/27/2019  ? Recurrent left knee instability 07/05/2018  ? Respiratory failure with hypoxia ([place]) 08/30/2017  ? Hypoxemia   ? Heart failure with preserved ejection fraction ([place]), Grade 3 diastolic dysfunction 29/79/8921  ? PAF (paroxysmal atrial fibrillation) ([place])   ? Dyspnea 03/19/2017  ? Encounter for preventive health examination 02/17/2016  ? Sensorineural hearing loss (SNHL), bilateral 01/26/2016  ? Morbid obesity ([place]) 06/17/2015  ? Hypomagnesemia 04/24/2014  ? Hemiparesis affecting left side as late effect of cerebrovascular accident ([place]) 04/24/2014  ? Nontraumatic cerebral hemorrhage ([place]) 04/30/2012   ? DM (diabetes mellitus) with complications ([place]) 19/41/7408  ? OSTEOPENIA 01/21/2009  ? UNSPECIFIED VITAMIN D DEFICIENCY 11/19/2007  ? HYPERCHOLESTEROLEMIA 10/25/2006  ? GASTROESOPHAGEAL REFLUX, NO ESOPHAGITIS 10/25/2006  ? DIVERTICULOSIS OF COLON 10/25/2006  ?  Osteoarthritis 10/25/2006  ? CERVICAL SPINE DISORDER, NOS 10/25/2006  ? ? Sumner Boast, PT ?11/24/2021, 11:47 AM ? ?[facility] ?[place] ?[address] ?[place], [place], [address] ?Phone: [phone]   Fax:  [phone] ? ?Name: Karen Jackson ?MRN: [id number] ?Date of Birth: July 31, 1941 ? ? ? ?

## 2021-11-28 ENCOUNTER — Ambulatory Visit: Payer: Medicare Other | Attending: Family Medicine | Admitting: Physical Therapy

## 2021-11-28 ENCOUNTER — Encounter: Payer: Self-pay | Admitting: Physical Therapy

## 2021-11-28 DIAGNOSIS — R262 Difficulty in walking, not elsewhere classified: Secondary | ICD-10-CM | POA: Diagnosis not present

## 2021-11-28 DIAGNOSIS — M25512 Pain in left shoulder: Secondary | ICD-10-CM

## 2021-11-28 DIAGNOSIS — I6990 Unspecified sequelae of unspecified cerebrovascular disease: Secondary | ICD-10-CM

## 2021-11-28 DIAGNOSIS — M25572 Pain in left ankle and joints of left foot: Secondary | ICD-10-CM

## 2021-11-28 DIAGNOSIS — I69354 Hemiplegia and hemiparesis following cerebral infarction affecting left non-dominant side: Secondary | ICD-10-CM

## 2021-11-28 DIAGNOSIS — M6281 Muscle weakness (generalized): Secondary | ICD-10-CM | POA: Diagnosis not present

## 2021-11-28 DIAGNOSIS — R270 Ataxia, unspecified: Secondary | ICD-10-CM

## 2021-11-28 NOTE — Therapy (Signed)
[facility] ?[place] [address] ?[place], [place], [address] ?Phone: [phone]   Fax:  [phone] ? ?Physical Therapy Treatment ? ?Patient Details  ?Name: Karen Jackson ?MRN: [id number] ?Date of Birth: Jun 23, 1941 ?Referring Provider (PT): Hensel ? ? ?Encounter Date: 11/28/2021 ? ? PT End of Session - 11/28/21 1139   ? ? Visit Number 26   ? Date for PT Re-Evaluation 12/04/21   ? Authorization Type Medicare   ? PT Start Time 1050   ? PT Stop Time 1135   ? PT Time Calculation (min) 45 min   ? Activity Tolerance Patient tolerated treatment well;Patient limited by fatigue;Patient limited by pain   ? Behavior During Therapy [facility] for tasks assessed/performed   ? ?  ?  ? ?  ? ? ?Past Medical History:  ?Diagnosis Date  ? Acute cystitis without hematuria   ? Acute diastolic CHF (congestive heart failure) ([place])   ? Arthritis   ? Dyspnea   ? Dysrhythmia   ? Fever of unknown origin 03/19/2017  ? Hyperlipidemia   ? Hypertension   ? denies at preop  ? Multifocal pneumonia   ? Neuromuscular disorder ([place])   ? neuropathy left arm and foot  ? Osteopenia   ? Paralysis ([place])   ? partial left side from CVA   ? Persistent atrial fibrillation ([place])   ? PONV (postoperative nausea and vomiting)   ? Pre-diabetes   ? Stroke [facility]) 2013  ? hemmorahgic  ? ? ?Past Surgical History:  ?Procedure Laterality Date  ? ANKLE SURGERY    ? APPENDECTOMY    ? CHOLECYSTECTOMY    ? HERNIA REPAIR    ? Esophagus  ? JOINT REPLACEMENT    ? total- right partial- left  ? MASTECTOMY PARTIAL / LUMPECTOMY  2012  ? left  ? ORIF ANKLE FRACTURE Left 07/20/2018  ? Procedure: OPEN REDUCTION INTERNAL FIXATION (ORIF) ANKLE FRACTURE;  Surgeon: Wylene Simmer, MD;  Location: [facility];  Service: Orthopedics;  Laterality: Left;  ? TOTAL KNEE ARTHROPLASTY Left 01/27/2019  ? Procedure: TOTAL KNEE ARTHROPLASTY;  Surgeon: Vickey Huger, MD;  [facility];  Service: Orthopedics;  Laterality: Left;  ? ? ?There were no vitals filed for this  visit. ? ? Subjective Assessment - 11/28/21 1100   ? ? Subjective My ankle hurts today   ? Currently in Pain? Yes   ? Pain Score 5    ? Pain Location Ankle   ? Pain Orientation Left   ? Pain Descriptors / Indicators Sore   ? Aggravating Factors  standing and walking   ? ?  ?  ? ?  ? ? ? ? ? ? ? ? ? ? ? ? ? ? ? ? ? ? ? ? [facility] Adult PT Treatment/Exercise - 11/28/21 0001   ? ?  ? Ambulation/Gait  ? Gait Comments 70 feet with FWW and CGA had to sit, c/o knee and ankle pain and instability, did some manual motions with her and then we were able to go 110' and then 90 feet, the first one was with small choppy steps, she got better after that.   ?  ? High Level Balance  ? High Level Balance Comments standing weight shifts to get some WBing in the ankle and the knee on the left   ?  ? Knee/Hip Exercises: Aerobic  ? Nustep level 4 x 10 minutes   ?  ? Knee/Hip Exercises: Seated  ? Long [employer] Strengthening;Both;10  reps;Weights;3 sets   ? Long Arc Quad Weight 3 lbs.   ? Other Seated Knee/Hip Exercises red tband ankle exercises   ? Hamstring Curl 2 sets;15 reps;Strengthening   ? Hamstring Limitations red   ?  ? Manual Therapy  ? Manual therapy comments STM to the left upper trap   ? Passive ROM left ankle for pain   ? ?  ?  ? ?  ? ? ? ? ? ? ? ? ? ? ? ? PT Short Term Goals - 11/01/21 1056   ? ?  ? PT SHORT TERM GOAL #1  ? Title independent with initial HEP   ? Status Achieved   ? ?  ?  ? ?  ? ? ? ? PT Long Term Goals - 11/21/21 1145   ? ?  ? PT LONG TERM GOAL #1  ? Title walk with FWW x 150 feet without rest with supervision only   ? Status On-going   ?  ? PT LONG TERM GOAL #2  ? Title increase left knee strength to 4/5   ? Status On-going   ?  ? PT LONG TERM GOAL #3  ? Title decrease TUG to 33 seconds   ? Status On-going   ?  ? PT LONG TERM GOAL #4  ? Title transfer independently with set up   ? Status On-going   ?  ? PT LONG TERM GOAL #5  ? Title report pain in the shoulder and ankle 50% less   ? Status On-going   ? ?  ?   ? ?  ? ? ? ? ? ? ? ? Plan - 11/28/21 1140   ? ? Clinical Impression Statement Patient continued to have issues with the left knee and ankle, she could not go much more than 70 feet on the first attempt today due to pain and not feeling stable. After some passive motions with some approximation this seemed to get better, fatigue was a problem after the first attempt.   ? PT Next Visit Plan patient still struggling see if we can get her walking better   ? Consulted and Agree with Plan of Care Patient   ? ?  ?  ? ?  ? ? ?Patient will benefit from skilled therapeutic intervention in order to improve the following deficits and impairments:  Abnormal gait, Decreased range of motion, Difficulty walking, Impaired UE functional use, Increased muscle spasms, Cardiopulmonary status limiting activity, Decreased endurance, Decreased activity tolerance, Pain, Decreased balance, Decreased mobility, Decreased strength ? ?Visit Diagnosis: ?Muscle weakness (generalized) ? ?Pain in left ankle and joints of left foot ? ?Late effects of CVA (cerebrovascular accident) ? ?Difficulty in walking, not elsewhere classified ? ?Left shoulder pain, unspecified chronicity ? ?Hemiplegia and hemiparesis following cerebral infarction affecting left non-dominant side ([place]) ? ?Ataxia ? ? ? ? ?Problem List ?Patient Active Problem List  ? Diagnosis Date Noted  ? Secondary hypercoagulable state ([place]) 03/16/2021  ? Dizziness 03/10/2021  ? Orthostatic hypotension 10/15/2020  ? Presbycusis of both ears 03/08/2020  ? Mixed stress and urge urinary incontinence 12/02/2019  ? Macrocytosis 12/01/2019  ? Nutritional anemia 12/01/2019  ? S/P total knee replacement 01/27/2019  ? Recurrent left knee instability 07/05/2018  ? Respiratory failure with hypoxia ([place]) 08/30/2017  ? Hypoxemia   ? Heart failure with preserved ejection fraction ([place]), Grade 3 diastolic dysfunction 52/77/8242  ? PAF (paroxysmal atrial fibrillation) ([place])   ? Dyspnea 03/19/2017  ? Encounter  for preventive health examination  02/17/2016  ? Sensorineural hearing loss (SNHL), bilateral 01/26/2016  ? Morbid obesity ([place]) 06/17/2015  ? Hypomagnesemia 04/24/2014  ? Hemiparesis affecting left side as late effect of cerebrovascular accident ([place]) 04/24/2014  ? Nontraumatic cerebral hemorrhage ([place]) 04/30/2012  ? DM (diabetes mellitus) with complications ([place]) 55/37/4827  ? OSTEOPENIA 01/21/2009  ? UNSPECIFIED VITAMIN D DEFICIENCY 11/19/2007  ? HYPERCHOLESTEROLEMIA 10/25/2006  ? GASTROESOPHAGEAL REFLUX, NO ESOPHAGITIS 10/25/2006  ? DIVERTICULOSIS OF COLON 10/25/2006  ? Osteoarthritis 10/25/2006  ? CERVICAL SPINE DISORDER, NOS 10/25/2006  ? ? Sumner Boast, PT ?11/28/2021, 11:42 AM ? ?[facility] ?[address] ?[place], [place], [address] ?Phone: [phone]   Fax:  [phone] ? ?Name: TALICIA SUI ?MRN: [id number] ?Date of Birth: [birth year] ? ? ? ?

## 2021-11-30 ENCOUNTER — Encounter: Payer: Self-pay | Admitting: Podiatry

## 2021-11-30 ENCOUNTER — Ambulatory Visit (INDEPENDENT_AMBULATORY_CARE_PROVIDER_SITE_OTHER): Payer: Medicare Other | Admitting: Podiatry

## 2021-11-30 DIAGNOSIS — E118 Type 2 diabetes mellitus with unspecified complications: Secondary | ICD-10-CM | POA: Diagnosis not present

## 2021-11-30 DIAGNOSIS — M79609 Pain in unspecified limb: Secondary | ICD-10-CM | POA: Diagnosis not present

## 2021-11-30 DIAGNOSIS — B351 Tinea unguium: Secondary | ICD-10-CM

## 2021-11-30 DIAGNOSIS — L603 Nail dystrophy: Secondary | ICD-10-CM

## 2021-11-30 NOTE — Progress Notes (Signed)
This patient returns to my office for at risk foot care.  This patient requires this care by a professional since this patient will be at risk due to having diabetes.  This patient is unable to cut nails herself since the patient cannot reach her nails.These nails are painful walking and wearing shoes. Patient has coagulation defect due to taking eliquis. Patient presents to the office with her caregiver and in a wheelchair. This patient presents for at risk foot care today. ? ?General Appearance  Alert, conversant and in no acute stress. ? ?Vascular  Dorsalis pedis and posterior tibial  pulses are palpable  bilaterally.  Capillary return is within normal limits  bilaterally. Temperature is within normal limits  bilaterally. ? ?Neurologic  Senn-Weinstein monofilament wire test within normal limits  bilaterally. Muscle power within normal limits bilaterally. ? ?Nails Thick disfigured discolored nails with subungual debris  from hallux to fifth toes bilaterally. No evidence of bacterial infection or drainage bilaterally. ? ?Orthopedic  No limitations of motion  feet .  No crepitus or effusions noted.  No bony pathology or digital deformities noted. ? ?Skin  normotropic skin with no porokeratosis noted bilaterally.  No signs of infections or ulcers noted.    ? ?Onychomycosis  Pain in right toes  Pain in left toes ? ?Consent was obtained for treatment procedures.   Mechanical debridement of nails 1-5  bilaterally performed with a nail nipper.  Filed with dremel without incident.  ? ? ?Return office visit   3 months                   Told patient to return for periodic foot care and evaluation due to potential at risk complications. ? ? ?Gardiner Barefoot DPM   ?

## 2021-12-01 ENCOUNTER — Ambulatory Visit: Payer: Medicare Other | Admitting: Physical Therapy

## 2021-12-01 DIAGNOSIS — R262 Difficulty in walking, not elsewhere classified: Secondary | ICD-10-CM | POA: Diagnosis not present

## 2021-12-01 DIAGNOSIS — M25572 Pain in left ankle and joints of left foot: Secondary | ICD-10-CM | POA: Diagnosis not present

## 2021-12-01 DIAGNOSIS — I69354 Hemiplegia and hemiparesis following cerebral infarction affecting left non-dominant side: Secondary | ICD-10-CM | POA: Diagnosis not present

## 2021-12-01 DIAGNOSIS — I6990 Unspecified sequelae of unspecified cerebrovascular disease: Secondary | ICD-10-CM | POA: Diagnosis not present

## 2021-12-01 DIAGNOSIS — M6281 Muscle weakness (generalized): Secondary | ICD-10-CM

## 2021-12-01 DIAGNOSIS — M25512 Pain in left shoulder: Secondary | ICD-10-CM | POA: Diagnosis not present

## 2021-12-01 NOTE — Therapy (Signed)
[facility] ?[place] ?[address] ?[place], [place], [address] ?Phone: [phone]   Fax:  [phone] ? ?Physical Therapy Treatment ? ?Patient Details  ?Name: Karen Jackson ?MRN: [id number] ?Date of Birth: 1941/04/16 ?Referring Provider (PT): Hensel ? ? ?Encounter Date: 12/01/2021 ? ? PT End of Session - 12/01/21 1142   ? ? Visit Number 27   ? Date for PT Re-Evaluation 12/04/21   ? Authorization Type Medicare   ? PT Start Time 1100   ? PT Stop Time 3557   ? PT Time Calculation (min) 42 min   ? ?  ?  ? ?  ? ? ?Past Medical History:  ?Diagnosis Date  ? Acute cystitis without hematuria   ? Acute diastolic CHF (congestive heart failure) ([place])   ? Arthritis   ? Dyspnea   ? Dysrhythmia   ? Fever of unknown origin 03/19/2017  ? Hyperlipidemia   ? Hypertension   ? denies at preop  ? Multifocal pneumonia   ? Neuromuscular disorder ([place])   ? neuropathy left arm and foot  ? Osteopenia   ? Paralysis ([place])   ? partial left side from CVA   ? Persistent atrial fibrillation ([place])   ? PONV (postoperative nausea and vomiting)   ? Pre-diabetes   ? Stroke [facility]) 2013  ? hemmorahgic  ? ? ?Past Surgical History:  ?Procedure Laterality Date  ? ANKLE SURGERY    ? APPENDECTOMY    ? CHOLECYSTECTOMY    ? HERNIA REPAIR    ? Esophagus  ? JOINT REPLACEMENT    ? total- right partial- left  ? MASTECTOMY PARTIAL / LUMPECTOMY  2012  ? left  ? ORIF ANKLE FRACTURE Left 07/20/2018  ? Procedure: OPEN REDUCTION INTERNAL FIXATION (ORIF) ANKLE FRACTURE;  Surgeon: Wylene Simmer, MD;  Location: [facility];  Service: Orthopedics;  Laterality: Left;  ? TOTAL KNEE ARTHROPLASTY Left 01/27/2019  ? Procedure: TOTAL KNEE ARTHROPLASTY;  Surgeon: Vickey Huger, MD;  [facility];  Service: Orthopedics;  Laterality: Left;  ? ? ?There were no vitals filed for this visit. ? ? Subjective Assessment - 12/01/21 1117   ? ? Subjective ankle still hurts and knee is still swelling   ? Currently in Pain? Yes   ? Pain Score 5    ? ?  ?  ? ?   ? ? ? ? ? ? ? ? ? ? ? ? ? ? ? ? ? ? ? ? [facility] Adult PT Treatment/Exercise - 12/01/21 0001   ? ?  ? Ambulation/Gait  ? Gait Comments 67fet with FWW and CGA had to sit, c/o knee and ankle pain as fatigued increased trunk flexion   ?  ? Knee/Hip Exercises: Aerobic  ? Nustep level 5x 8 minutes   ?  ? Knee/Hip Exercises: Seated  ? Long Arc QTemple-Inlandsets;10 reps   ? Long ACSX CorporationLimitations green tband   ? Clamshell with TMarga Hoots  ? Knee/Hip Flexion green tband 2 sets 10   ? Other Seated Knee/Hip Exercises red tband ankle exercises   ? Hamstring Curl 2 sets;15 reps;Strengthening   ? Hamstring Limitations green   ?  ? Manual Therapy  ? Manual therapy comments STM to the left upper trap   ? Passive ROM left ankle for pain   ? ?  ?  ? ?  ? ? ? ? ? ? ? ? ? ? ? ? PT Short Term Goals - 11/01/21 1056   ? ?  ?  PT SHORT TERM GOAL #1  ? Title independent with initial HEP   ? Status Achieved   ? ?  ?  ? ?  ? ? ? ? PT Long Term Goals - 11/21/21 1145   ? ?  ? PT LONG TERM GOAL #1  ? Title walk with FWW x 150 feet without rest with supervision only   ? Status On-going   ?  ? PT LONG TERM GOAL #2  ? Title increase left knee strength to 4/5   ? Status On-going   ?  ? PT LONG TERM GOAL #3  ? Title decrease TUG to 33 seconds   ? Status On-going   ?  ? PT LONG TERM GOAL #4  ? Title transfer independently with set up   ? Status On-going   ?  ? PT LONG TERM GOAL #5  ? Title report pain in the shoulder and ankle 50% less   ? Status On-going   ? ?  ?  ? ?  ? ? ? ? ? ? ? ? Plan - 12/01/21 1142   ? ? Clinical Impression Statement pt still with issues with knee swelling and ankle pain. focused more on strengthening in non wt bearing   ? PT Treatment/Interventions ADLs/Self Care Home Management;Electrical Stimulation;Moist Heat;Gait training;Functional mobility training;Therapeutic activities;Therapeutic exercise;Balance training;Neuromuscular re-education;Patient/family education;Manual techniques   ? PT Next Visit Plan  patient still struggling see if we can get her walking better   ? ?  ?  ? ?  ? ? ?Patient will benefit from skilled therapeutic intervention in order to improve the following deficits and impairments:  Abnormal gait, Decreased range of motion, Difficulty walking, Impaired UE functional use, Increased muscle spasms, Cardiopulmonary status limiting activity, Decreased endurance, Decreased activity tolerance, Pain, Decreased balance, Decreased mobility, Decreased strength ? ?Visit Diagnosis: ?Muscle weakness (generalized) ? ?Pain in left ankle and joints of left foot ? ?Difficulty in walking, not elsewhere classified ? ? ? ? ?Problem List ?Patient Active Problem List  ? Diagnosis Date Noted  ? Secondary hypercoagulable state ([place]) 03/16/2021  ? Dizziness 03/10/2021  ? Orthostatic hypotension 10/15/2020  ? Presbycusis of both ears 03/08/2020  ? Mixed stress and urge urinary incontinence 12/02/2019  ? Macrocytosis 12/01/2019  ? Nutritional anemia 12/01/2019  ? S/P total knee replacement 01/27/2019  ? Recurrent left knee instability 07/05/2018  ? Respiratory failure with hypoxia ([place]) 08/30/2017  ? Hypoxemia   ? Heart failure with preserved ejection fraction ([place]), Grade 3 diastolic dysfunction 84/13/2440  ? PAF (paroxysmal atrial fibrillation) ([place])   ? Dyspnea 03/19/2017  ? Encounter for preventive health examination 02/17/2016  ? Sensorineural hearing loss (SNHL), bilateral 01/26/2016  ? Morbid obesity ([place]) 06/17/2015  ? Hypomagnesemia 04/24/2014  ? Hemiparesis affecting left side as late effect of cerebrovascular accident ([place]) 04/24/2014  ? Nontraumatic cerebral hemorrhage ([place]) 04/30/2012  ? DM (diabetes mellitus) with complications ([place]) 06/24/2535  ? OSTEOPENIA 01/21/2009  ? UNSPECIFIED VITAMIN D DEFICIENCY 11/19/2007  ? HYPERCHOLESTEROLEMIA 10/25/2006  ? GASTROESOPHAGEAL REFLUX, NO ESOPHAGITIS 10/25/2006  ? DIVERTICULOSIS OF COLON 10/25/2006  ? Osteoarthritis 10/25/2006  ? CERVICAL SPINE DISORDER, NOS 10/25/2006   ? ? ?,ANGIE, PTA ?12/01/2021, 11:45 AM ? ?[facility] ?[place] ?[address] ?[place], [place], [address] ?Phone: [phone]   Fax:  [phone] ? ?Name: Karen Jackson ?MRN: [id number] ?Date of Birth: [birth year] ? ? ? ?

## 2021-12-07 ENCOUNTER — Telehealth: Payer: Self-pay | Admitting: Pulmonary Disease

## 2021-12-07 NOTE — Telephone Encounter (Signed)
Please let patient know she spent 8hr and 18 min with SpO2 less than 88%. She qualifies for nocturnal oxygen therapy. Based on these results I would recommend we check an in lab sleep study to evaluate for sleep disordered breathing leading to her low oxygen levels or if she does not wish to proceed with further testing then I would recommend using 3L of supplemental oxygen at night while sleeping. Please place orders according to patient's wishes. ? ?Thanks, ?JD ?

## 2021-12-08 ENCOUNTER — Ambulatory Visit: Payer: Medicare Other | Admitting: Physical Therapy

## 2021-12-08 ENCOUNTER — Encounter: Payer: Self-pay | Admitting: Physical Therapy

## 2021-12-08 DIAGNOSIS — R262 Difficulty in walking, not elsewhere classified: Secondary | ICD-10-CM

## 2021-12-08 DIAGNOSIS — I69354 Hemiplegia and hemiparesis following cerebral infarction affecting left non-dominant side: Secondary | ICD-10-CM

## 2021-12-08 DIAGNOSIS — M25512 Pain in left shoulder: Secondary | ICD-10-CM

## 2021-12-08 DIAGNOSIS — M25572 Pain in left ankle and joints of left foot: Secondary | ICD-10-CM

## 2021-12-08 DIAGNOSIS — I6990 Unspecified sequelae of unspecified cerebrovascular disease: Secondary | ICD-10-CM

## 2021-12-08 DIAGNOSIS — M6281 Muscle weakness (generalized): Secondary | ICD-10-CM | POA: Diagnosis not present

## 2021-12-08 DIAGNOSIS — R270 Ataxia, unspecified: Secondary | ICD-10-CM

## 2021-12-08 NOTE — Therapy (Signed)
[facility] ?[place] ?[address] ?[place], [place], [address] ?Phone: [phone]   Fax:  [phone] ? ?Physical Therapy Treatment ? ?Patient Details  ?Name: Karen Jackson ?MRN: [id number] ?Date of Birth: Feb 21, 1941 ?Referring Provider (PT): Hensel ? ? ?Encounter Date: 12/08/2021 ? ? PT End of Session - 12/08/21 1145   ? ? Visit Number 28   ? Date for PT Re-Evaluation 01/07/22   ? Authorization Type Medicare   ? PT Start Time 1050   ? PT Stop Time 1133   ? PT Time Calculation (min) 43 min   ? Activity Tolerance Patient tolerated treatment well;Patient limited by fatigue   ? Behavior During Therapy [facility] for tasks assessed/performed   ? ?  ?  ? ?  ? ? ?Past Medical History:  ?Diagnosis Date  ? Acute cystitis without hematuria   ? Acute diastolic CHF (congestive heart failure) ([place])   ? Arthritis   ? Dyspnea   ? Dysrhythmia   ? Fever of unknown origin 03/19/2017  ? Hyperlipidemia   ? Hypertension   ? denies at preop  ? Multifocal pneumonia   ? Neuromuscular disorder ([place])   ? neuropathy left arm and foot  ? Osteopenia   ? Paralysis ([place])   ? partial left side from CVA   ? Persistent atrial fibrillation ([place])   ? PONV (postoperative nausea and vomiting)   ? Pre-diabetes   ? Stroke [facility]) 2013  ? hemmorahgic  ? ? ?Past Surgical History:  ?Procedure Laterality Date  ? ANKLE SURGERY    ? APPENDECTOMY    ? CHOLECYSTECTOMY    ? HERNIA REPAIR    ? Esophagus  ? JOINT REPLACEMENT    ? total- right partial- left  ? MASTECTOMY PARTIAL / LUMPECTOMY  2012  ? left  ? ORIF ANKLE FRACTURE Left 07/20/2018  ? Procedure: OPEN REDUCTION INTERNAL FIXATION (ORIF) ANKLE FRACTURE;  Surgeon: Wylene Simmer, MD;  Location: [facility];  Service: Orthopedics;  Laterality: Left;  ? TOTAL KNEE ARTHROPLASTY Left 01/27/2019  ? Procedure: TOTAL KNEE ARTHROPLASTY;  Surgeon: Vickey Huger, MD;  [facility];  Service: Orthopedics;  Laterality: Left;  ? ? ?There were no vitals filed for this visit. ? ? Subjective  Assessment - 12/08/21 1115   ? ? Subjective I am struggling a little today, reports she had a lot of difficulty transferring and walking yesterday.  "My leg was sore and did not feel like it wanted to work"   ? Currently in Pain? No/denies   ? ?  ?  ? ?  ? ? ? ? ? ? ? ? ? ? ? ? ? ? ? ? ? ? ? ? [facility] Adult PT Treatment/Exercise - 12/08/21 0001   ? ?  ? Ambulation/Gait  ? Gait Comments FWW SBA 60 feet x 4.  Limited due to SOB   ?  ? High Level Balance  ? High Level Balance Activities Side stepping;Backward walking   ? High Level Balance Comments in Pbars, 2" toe touches   ?  ? Knee/Hip Exercises: Aerobic  ? Nustep level 5x 8 minutes   ?  ? Knee/Hip Exercises: Standing  ? Hip Flexion Both;2 sets;10 reps   ? Hip Flexion Limitations in Pbars   ?  ? Knee/Hip Exercises: Seated  ? Long Arc Temple[employer]Inland sets;10 reps   ? Long Arc Quad Weight 5 lbs.   ? Ball Squeeze 2x10   ? Other Seated Knee/Hip Exercises red tband ankle  exercises   ? Hamstring Curl 2 sets;15 reps;Strengthening   ? Hamstring Limitations green   ? ?  ?  ? ?  ? ? ? ? ? ? ? ? ? ? ? ? PT Short Term Goals - 11/01/21 1056   ? ?  ? PT SHORT TERM GOAL #1  ? Title independent with initial HEP   ? Status Achieved   ? ?  ?  ? ?  ? ? ? ? PT Long Term Goals - 12/08/21 1149   ? ?  ? PT LONG TERM GOAL #1  ? Title walk with FWW x 150 feet without rest with supervision only   ? Status On-going   ?  ? PT LONG TERM GOAL #2  ? Title increase left knee strength to 4/5   ? Status On-going   ?  ? PT LONG TERM GOAL #3  ? Title decrease TUG to 33 seconds   ? Status On-going   ?  ? PT LONG TERM GOAL #4  ? Title transfer independently with set up   ? Status On-going   ?  ? PT LONG TERM GOAL #5  ? Title report pain in the shoulder and ankle 50% less   ? Status On-going   ? ?  ?  ? ?  ? ? ? ? ? ? ? ? Plan - 12/08/21 1147   ? ? Clinical Impression Statement Patient seems to have had some set backs over the last period, she has had knee and ankle pain and swelling.  She also is very  short of breath with exertion, she has talked to the MD and a catheterization was talked about, she will have a visit with pulmonolgist whe she has been waiting to see for 6 months.  She had been able to walk 120 feet with supervision but now due to SOB she is doing about 60 feet with SBA.  Recently has had some difficulty transferring due to the left ankle and knee pain as well.   ? PT Frequency 2x / week   ? PT Treatment/Interventions ADLs/Self Care Home Management;Electrical Stimulation;Moist Heat;Gait training;Functional mobility training;Therapeutic activities;Therapeutic exercise;Balance training;Neuromuscular re-education;Patient/family education;Manual techniques   ? PT Next Visit Plan patient still struggling see if we can get her walking better   ? Consulted and Agree with Plan of Care Patient   ? ?  ?  ? ?  ? ? ?Patient will benefit from skilled therapeutic intervention in order to improve the following deficits and impairments:  Abnormal gait, Decreased range of motion, Difficulty walking, Impaired UE functional use, Increased muscle spasms, Cardiopulmonary status limiting activity, Decreased endurance, Decreased activity tolerance, Pain, Decreased balance, Decreased mobility, Decreased strength ? ?Visit Diagnosis: ?Muscle weakness (generalized) - Plan: PT plan of care cert/re-cert ? ?Pain in left ankle and joints of left foot - Plan: PT plan of care cert/re-cert ? ?Difficulty in walking, not elsewhere classified - Plan: PT plan of care cert/re-cert ? ?Late effects of CVA (cerebrovascular accident) - Plan: PT plan of care cert/re-cert ? ?Left shoulder pain, unspecified chronicity - Plan: PT plan of care cert/re-cert ? ?Hemiplegia and hemiparesis following cerebral infarction affecting left non-dominant side ([place]) - Plan: PT plan of care cert/re-cert ? ?Ataxia - Plan: PT plan of care cert/re-cert ? ? ? ? ?Problem List ?Patient Active Problem List  ? Diagnosis Date Noted  ? Secondary hypercoagulable state  ([place]) 03/16/2021  ? Dizziness 03/10/2021  ? Orthostatic hypotension 10/15/2020  ? Presbycusis of both ears  03/08/2020  ? Mixed stress and urge urinary incontinence 12/02/2019  ? Macrocytosis 12/01/2019  ? Nutritional anemia 12/01/2019  ? S/P total knee replacement 01/27/2019  ? Recurrent left knee instability 07/05/2018  ? Respiratory failure with hypoxia ([place]) 08/30/2017  ? Hypoxemia   ? Heart failure with preserved ejection fraction ([place]), Grade 3 diastolic dysfunction 08/81/1031  ? PAF (paroxysmal atrial fibrillation) ([place])   ? Dyspnea 03/19/2017  ? Encounter for preventive health examination 02/17/2016  ? Sensorineural hearing loss (SNHL), bilateral 01/26/2016  ? Morbid obesity ([place]) 06/17/2015  ? Hypomagnesemia 04/24/2014  ? Hemiparesis affecting left side as late effect of cerebrovascular accident ([place]) 04/24/2014  ? Nontraumatic cerebral hemorrhage ([place]) 04/30/2012  ? DM (diabetes mellitus) with complications ([place]) 59/45/8592  ? OSTEOPENIA 01/21/2009  ? UNSPECIFIED VITAMIN D DEFICIENCY 11/19/2007  ? HYPERCHOLESTEROLEMIA 10/25/2006  ? GASTROESOPHAGEAL REFLUX, NO ESOPHAGITIS 10/25/2006  ? DIVERTICULOSIS OF COLON 10/25/2006  ? Osteoarthritis 10/25/2006  ? CERVICAL SPINE DISORDER, NOS 10/25/2006  ? ? Sumner Boast, PT ?12/08/2021, 11:55 AM ? ?[facility] ?[place] [address] ?[place], [place], [address] ?Phone: [phone]   Fax:  [phone] ? ?Name: BRIONY PARVEEN ?MRN: [id number] ?Date of Birth: 1941/06/16 ? ? ? ?

## 2021-12-12 ENCOUNTER — Ambulatory Visit: Payer: Medicare Other | Admitting: Physical Therapy

## 2021-12-12 ENCOUNTER — Encounter: Payer: Self-pay | Admitting: Physical Therapy

## 2021-12-12 DIAGNOSIS — M6281 Muscle weakness (generalized): Secondary | ICD-10-CM

## 2021-12-12 DIAGNOSIS — I69354 Hemiplegia and hemiparesis following cerebral infarction affecting left non-dominant side: Secondary | ICD-10-CM | POA: Diagnosis not present

## 2021-12-12 DIAGNOSIS — M25572 Pain in left ankle and joints of left foot: Secondary | ICD-10-CM | POA: Diagnosis not present

## 2021-12-12 DIAGNOSIS — I6990 Unspecified sequelae of unspecified cerebrovascular disease: Secondary | ICD-10-CM | POA: Diagnosis not present

## 2021-12-12 DIAGNOSIS — R262 Difficulty in walking, not elsewhere classified: Secondary | ICD-10-CM

## 2021-12-12 DIAGNOSIS — M25512 Pain in left shoulder: Secondary | ICD-10-CM | POA: Diagnosis not present

## 2021-12-12 DIAGNOSIS — R270 Ataxia, unspecified: Secondary | ICD-10-CM

## 2021-12-12 NOTE — Therapy (Signed)
[facility] ?[place] [address] ?[place], [place], [address] ?Phone: [phone]   Fax:  [phone] ? ?Physical Therapy Treatment ? ?Patient Details  ?Name: Karen Jackson ?MRN: [id number] ?Date of Birth: 1941/08/11 ?Referring Provider (PT): Hensel ? ? ?Encounter Date: 12/12/2021 ? ? PT End of Session - 12/12/21 1144   ? ? Visit Number 29   ? Date for PT Re-Evaluation 01/07/22   ? Authorization Type Medicare   ? PT Start Time 1055   ? PT Stop Time 1140   ? PT Time Calculation (min) 45 min   ? Activity Tolerance Patient limited by pain;Patient limited by fatigue   ? Behavior During Therapy [facility] for tasks assessed/performed   ? ?  ?  ? ?  ? ? ?Past Medical History:  ?Diagnosis Date  ? Acute cystitis without hematuria   ? Acute diastolic CHF (congestive heart failure) ([place])   ? Arthritis   ? Dyspnea   ? Dysrhythmia   ? Fever of unknown origin 03/19/2017  ? Hyperlipidemia   ? Hypertension   ? denies at preop  ? Multifocal pneumonia   ? Neuromuscular disorder ([place])   ? neuropathy left arm and foot  ? Osteopenia   ? Paralysis ([place])   ? partial left side from CVA   ? Persistent atrial fibrillation ([place])   ? PONV (postoperative nausea and vomiting)   ? Pre-diabetes   ? Stroke [facility]) 2013  ? hemmorahgic  ? ? ?Past Surgical History:  ?Procedure Laterality Date  ? ANKLE SURGERY    ? APPENDECTOMY    ? CHOLECYSTECTOMY    ? HERNIA REPAIR    ? Esophagus  ? JOINT REPLACEMENT    ? total- right partial- left  ? MASTECTOMY PARTIAL / LUMPECTOMY  2012  ? left  ? ORIF ANKLE FRACTURE Left 07/20/2018  ? Procedure: OPEN REDUCTION INTERNAL FIXATION (ORIF) ANKLE FRACTURE;  Surgeon: Wylene Simmer, MD;  Location: [facility];  Service: Orthopedics;  Laterality: Left;  ? TOTAL KNEE ARTHROPLASTY Left 01/27/2019  ? Procedure: TOTAL KNEE ARTHROPLASTY;  Surgeon: Vickey Huger, MD;  [facility];  Service: Orthopedics;  Laterality: Left;  ? ? ?There were no vitals filed for this visit. ? ? Subjective Assessment -  12/12/21 1059   ? ? Subjective Patient reports that her ankle turned on Sunday trying to transfer with her caregiver.  She had difficulty with transfer with me today as she was pulling back and had difficulty going forward   ? ?  ?  ? ?  ? ? ? ? ? ? ? ? ? ? ? ? ? ? ? ? ? ? ? ? [facility] Adult PT Treatment/Exercise - 12/12/21 0001   ? ?  ? Ambulation/Gait  ? Gait Comments attempted walking 2 x with her being able to go only about 30 feet due to pain and difficulty picking up feet.    At the end of the treatment she was able to go 100 feet   ?  ? Knee/Hip Exercises: Aerobic  ? Nustep level 5x 8 minutes   ?  ? Knee/Hip Exercises: Seated  ? Long Arc [employer] Strengthening;10 reps;3 sets   ? Long Arc Quad Weight 5 lbs.   ? Other Seated Knee/Hip Exercises red tband ankle exercises   ? Marching Both;3 sets;10 reps   ? Marching Weights 5 lbs.   ?  ? Knee/Hip Exercises: Supine  ? Bridges 10 reps;2 sets   ? Bridges with [employer]  10 reps;2 sets   ? Other Supine Knee/Hip Exercises left hip on sliding board abduction and adduction, knee to chest feet on ball   ? Other Supine Knee/Hip Exercises crunches 2x10   ?  ? Knee/Hip Exercises: Sidelying  ? Clams left 3x 8   ?  ? Manual Therapy  ? Passive ROM left ankle for pain   ? ?  ?  ? ?  ? ? ? ? ? ? ? ? ? ? ? ? PT Short Term Goals - 11/01/21 1056   ? ?  ? PT SHORT TERM GOAL #1  ? Title independent with initial HEP   ? Status Achieved   ? ?  ?  ? ?  ? ? ? ? PT Long Term Goals - 12/08/21 1149   ? ?  ? PT LONG TERM GOAL #1  ? Title walk with FWW x 150 feet without rest with supervision only   ? Status On-going   ?  ? PT LONG TERM GOAL #2  ? Title increase left knee strength to 4/5   ? Status On-going   ?  ? PT LONG TERM GOAL #3  ? Title decrease TUG to 33 seconds   ? Status On-going   ?  ? PT LONG TERM GOAL #4  ? Title transfer independently with set up   ? Status On-going   ?  ? PT LONG TERM GOAL #5  ? Title report pain in the shoulder and ankle 50% less   ? Status On-going   ? ?  ?  ? ?   ? ? ? ? ? ? ? ? Plan - 12/12/21 1145   ? ? Clinical Impression Statement Patient will see pulmonologist tomorrow.  She really struggled today, she reports had an issue with one transfer over the weekend with the ankle, she was leaning back and had diffiuclty moving forward, reported difficulty picking up feet.  The only thing we could think of is that she had on different shoes, they are shoes that she has worn in the past but reports has not worn them in a year.  The heels we thicker and seemed very stiff, i am not sure if that was the issue or not.  She c/o both knees not bending and some pain   ? PT Next Visit Plan patient still struggling see if we can get her walking better   ? Consulted and Agree with Plan of Care Patient   ? ?  ?  ? ?  ? ? ?Patient will benefit from skilled therapeutic intervention in order to improve the following deficits and impairments:  Abnormal gait, Decreased range of motion, Difficulty walking, Impaired UE functional use, Increased muscle spasms, Cardiopulmonary status limiting activity, Decreased endurance, Decreased activity tolerance, Pain, Decreased balance, Decreased mobility, Decreased strength ? ?Visit Diagnosis: ?Muscle weakness (generalized) ? ?Pain in left ankle and joints of left foot ? ?Difficulty in walking, not elsewhere classified ? ?Late effects of CVA (cerebrovascular accident) ? ?Left shoulder pain, unspecified chronicity ? ?Hemiplegia and hemiparesis following cerebral infarction affecting left non-dominant side ([place]) ? ?Ataxia ? ? ? ? ?Problem List ?Patient Active Problem List  ? Diagnosis Date Noted  ? Secondary hypercoagulable state ([place]) 03/16/2021  ? Dizziness 03/10/2021  ? Orthostatic hypotension 10/15/2020  ? Presbycusis of both ears 03/08/2020  ? Mixed stress and urge urinary incontinence 12/02/2019  ? Macrocytosis 12/01/2019  ? Nutritional anemia 12/01/2019  ? S/P total knee replacement 01/27/2019  ? Recurrent left  knee instability 07/05/2018  ?  Respiratory failure with hypoxia ([place]) 08/30/2017  ? Hypoxemia   ? Heart failure with preserved ejection fraction ([place]), Grade 3 diastolic dysfunction 33/00/7622  ? PAF (paroxysmal atrial fibrillation) ([place])   ? Dyspnea 03/19/2017  ? Encounter for preventive health examination 02/17/2016  ? Sensorineural hearing loss (SNHL), bilateral 01/26/2016  ? Morbid obesity ([place]) 06/17/2015  ? Hypomagnesemia 04/24/2014  ? Hemiparesis affecting left side as late effect of cerebrovascular accident ([place]) 04/24/2014  ? Nontraumatic cerebral hemorrhage ([place]) 04/30/2012  ? DM (diabetes mellitus) with complications ([place]) 63/33/5456  ? OSTEOPENIA 01/21/2009  ? UNSPECIFIED VITAMIN D DEFICIENCY 11/19/2007  ? HYPERCHOLESTEROLEMIA 10/25/2006  ? GASTROESOPHAGEAL REFLUX, NO ESOPHAGITIS 10/25/2006  ? DIVERTICULOSIS OF COLON 10/25/2006  ? Osteoarthritis 10/25/2006  ? CERVICAL SPINE DISORDER, NOS 10/25/2006  ? ? Sumner Boast, PT ?12/12/2021, 11:49 AM ? ?[facility] ?[address] ?[address] ?[place], [place], [address] ?Phone: [phone]   Fax:  [phone] ? ?Name: Karen Jackson ?MRN: [id number] ?Date of Birth: 05-Jul-1941 ? ? ? ?

## 2021-12-13 ENCOUNTER — Ambulatory Visit (INDEPENDENT_AMBULATORY_CARE_PROVIDER_SITE_OTHER): Payer: Medicare Other | Admitting: Pulmonary Disease

## 2021-12-13 DIAGNOSIS — R0602 Shortness of breath: Secondary | ICD-10-CM

## 2021-12-13 LAB — PULMONARY FUNCTION TEST
FEF 25-75 Post: 0.86 L/sec
FEF 25-75 Pre: 0.77 L/sec
FEF2575-%Change-Post: 10 %
FEF2575-%Pred-Post: 83 %
FEF2575-%Pred-Pre: 75 %
FEV1-%Change-Post: 2 %
FEV1-%Pred-Post: 64 %
FEV1-%Pred-Pre: 62 %
FEV1-Post: 0.87 L
FEV1-Pre: 0.86 L
FEV1FVC-%Change-Post: 3 %
FEV1FVC-%Pred-Pre: 109 %
FEV6-%Change-Post: 0 %
FEV6-%Pred-Post: 60 %
FEV6-%Pred-Pre: 61 %
FEV6-Post: 1.05 L
FEV6-Pre: 1.07 L
FEV6FVC-%Pred-Post: 107 %
FEV6FVC-%Pred-Pre: 107 %
FVC-%Change-Post: 0 %
FVC-%Pred-Post: 56 %
FVC-%Pred-Pre: 57 %
FVC-Post: 1.05 L
FVC-Pre: 1.07 L
Post FEV1/FVC ratio: 83 %
Post FEV6/FVC ratio: 100 %
Pre FEV1/FVC ratio: 80 %
Pre FEV6/FVC Ratio: 100 %

## 2021-12-13 NOTE — Progress Notes (Signed)
Spirometry pre and post done today. 

## 2021-12-15 ENCOUNTER — Ambulatory Visit: Payer: Medicare Other | Admitting: Physical Therapy

## 2021-12-15 DIAGNOSIS — M25572 Pain in left ankle and joints of left foot: Secondary | ICD-10-CM | POA: Diagnosis not present

## 2021-12-15 DIAGNOSIS — M25512 Pain in left shoulder: Secondary | ICD-10-CM | POA: Diagnosis not present

## 2021-12-15 DIAGNOSIS — R262 Difficulty in walking, not elsewhere classified: Secondary | ICD-10-CM | POA: Diagnosis not present

## 2021-12-15 DIAGNOSIS — I69354 Hemiplegia and hemiparesis following cerebral infarction affecting left non-dominant side: Secondary | ICD-10-CM | POA: Diagnosis not present

## 2021-12-15 DIAGNOSIS — M6281 Muscle weakness (generalized): Secondary | ICD-10-CM | POA: Diagnosis not present

## 2021-12-15 DIAGNOSIS — I6990 Unspecified sequelae of unspecified cerebrovascular disease: Secondary | ICD-10-CM

## 2021-12-15 NOTE — Therapy (Signed)
[facility] ?[address] ?[address] ?[place], [place], [address] ?Phone: [phone]   Fax:  [phone] ?Progress Note ?Reporting Period 10/19/21 to 12/15/21 for visits 21-30 ? ?See note below for Objective Data and Assessment of Progress/Goals.  ? ?  ?Physical Therapy Treatment ? ?Patient Details  ?Name: Karen Jackson ?MRN: [id number] ?Date of Birth: [birth year] ?Referring Provider (PT): Hensel ? ? ?Encounter Date: 12/15/2021 ? ? PT End of Session - 12/15/21 1141   ? ? Visit Number 30   ? Date for PT Re-Evaluation 01/07/22   ? Authorization Type Medicare   ? PT Start Time 1050   ? PT Stop Time 1140   ? PT Time Calculation (min) 50 min   ? ?  ?  ? ?  ? ? ?Past Medical History:  ?Diagnosis Date  ? Acute cystitis without hematuria   ? Acute diastolic CHF (congestive heart failure) ([place])   ? Arthritis   ? Dyspnea   ? Dysrhythmia   ? Fever of unknown origin 03/19/2017  ? Hyperlipidemia   ? Hypertension   ? denies at preop  ? Multifocal pneumonia   ? Neuromuscular disorder ([facility])   ? neuropathy left arm and foot  ? Osteopenia   ? Paralysis ([place])   ? partial left side from CVA   ? Persistent atrial fibrillation ([place])   ? PONV (postoperative nausea and vomiting)   ? Pre-diabetes   ? Stroke [facility]) 2013  ? hemmorahgic  ? ? ?Past Surgical History:  ?Procedure Laterality Date  ? ANKLE SURGERY    ? APPENDECTOMY    ? CHOLECYSTECTOMY    ? HERNIA REPAIR    ? Esophagus  ? JOINT REPLACEMENT    ? total- right partial- left  ? MASTECTOMY PARTIAL / LUMPECTOMY  2012  ? left  ? ORIF ANKLE FRACTURE Left 07/20/2018  ? Procedure: OPEN REDUCTION INTERNAL FIXATION (ORIF) ANKLE FRACTURE;  Surgeon: Wylene Simmer, MD;  Location: [facility];  Service: Orthopedics;  Laterality: Left;  ? TOTAL KNEE ARTHROPLASTY Left 01/27/2019  ? Procedure: TOTAL KNEE ARTHROPLASTY;  Surgeon: Vickey Huger, MD;  [facility];  Service: Orthopedics;  Laterality: Left;  ? ? ?There were no vitals filed for this visit. ? ? Subjective  Assessment - 12/15/21 1057   ? ? Subjective i think those new shoes messed me up last time, BIL LE swelling still. left ankle still sore   ? Currently in Pain? No/denies   ? ?  ?  ? ?  ? ? ? ? ? ? ? ? ? ? ? ? ? ? ? ? ? ? ? ? [facility] [place] Adult PT Treatment/Exercise - 12/15/21 0001   ? ?  ? Ambulation/Gait  ? Gait Comments amb 75 feet with RW - attempted 100 but SOB and had to sit CGA, able to pcik feet up better   2nd walk at end of session 50 feet then fatigued  ?  ? Knee/Hip Exercises: Aerobic  ? Nustep level 5x 7.5 minutes   500 steps  ?  ? Knee/Hip Exercises: Standing  ? Other Standing Knee Exercises hip flex1 0 x each leg with RW and CGA- SLS on LLE increased knee pain   ?  ? Knee/Hip Exercises: Seated  ? Long Arc [employer] Strengthening;10 reps;3 sets   ? Long Arc Quad Weight 5 lbs.   ? Hamstring Curl Strengthening;Both;2 sets;15 reps   green tband  ? Sit to Sand 5 reps;with UE support   working on  full standing and controlled lowering  ?  ? Manual Therapy  ? Passive ROM left ankle for pain   ? ?  ?  ? ?  ? ? ? ? ? ? ? ? ? ? ? ? PT Short Term Goals - 11/01/21 1056   ? ?  ? PT SHORT TERM GOAL #1  ? Title independent with initial HEP   ? Status Achieved   ? ?  ?  ? ?  ? ? ? ? PT Long Term Goals - 12/15/21 1058   ? ?  ? PT LONG TERM GOAL #1  ? Title walk with FWW x 150 feet without rest with supervision only   ? Status On-going   ?  ? PT LONG TERM GOAL #2  ? Title increase left knee strength to 4/5   ? Baseline quad 4/5, HS 4-/5   ? Status Partially Met   ?  ? PT LONG TERM GOAL #3  ? Title decrease TUG to 33 seconds   ? Baseline 40.74 sec very slow with turns   ? Status On-going   ?  ? PT LONG TERM GOAL #4  ? Title transfer independently with set up   ? Status On-going   ?  ? PT LONG TERM GOAL #5  ? Title report pain in the shoulder and ankle 50% less   ? Status On-going   ? ?  ?  ? ?  ? ? ? ? ? ? ? ? Plan - 12/15/21 1141   ? ? Clinical Impression Statement pt moving better with gait with old shoes on but SOB limited gait.  discussed getting new shoes and what type. still assistance with transfers and working on getting pivoted all the way around and controlling decent. goals addressed and making slow progress   ? PT Treatment/Interventions ADLs/Self Care Home Management;Electrical Stimulation;Moist Heat;Gait training;Functional mobility training;Therapeutic activities;Therapeutic exercise;Balance training;Neuromuscular re-education;Patient/family education;Manual techniques   ? PT Next Visit Plan patient still struggling see if we can get her walking better and transfers   ? ?  ?  ? ?  ? ? ?Patient will benefit from skilled therapeutic intervention in order to improve the following deficits and impairments:  Abnormal gait, Decreased range of motion, Difficulty walking, Impaired UE functional use, Increased muscle spasms, Cardiopulmonary status limiting activity, Decreased endurance, Decreased activity tolerance, Pain, Decreased balance, Decreased mobility, Decreased strength ? ?Visit Diagnosis: ?Muscle weakness (generalized) ? ?Pain in left ankle and joints of left foot ? ?Difficulty in walking, not elsewhere classified ? ?Late effects of CVA (cerebrovascular accident) ? ? ? ? ?Problem List ?Patient Active Problem List  ? Diagnosis Date Noted  ? Secondary hypercoagulable state ([place]) 03/16/2021  ? Dizziness 03/10/2021  ? Orthostatic hypotension 10/15/2020  ? Presbycusis of both ears 03/08/2020  ? Mixed stress and urge urinary incontinence 12/02/2019  ? Macrocytosis 12/01/2019  ? Nutritional anemia 12/01/2019  ? S/P total knee replacement 01/27/2019  ? Recurrent left knee instability 07/05/2018  ? Respiratory failure with hypoxia ([place]) 08/30/2017  ? Hypoxemia   ? Heart failure with preserved ejection fraction ([place]), Grade 3 diastolic dysfunction 70/26/3785  ? PAF (paroxysmal atrial fibrillation) ([place])   ? Dyspnea 03/19/2017  ? Encounter for preventive health examination 02/17/2016  ? Sensorineural hearing loss (SNHL), bilateral  01/26/2016  ? Morbid obesity ([place]) 06/17/2015  ? Hypomagnesemia 04/24/2014  ? Hemiparesis affecting left side as late effect of cerebrovascular accident ([place]) 04/24/2014  ? Nontraumatic cerebral hemorrhage ([place]) 04/30/2012  ? DM (diabetes  mellitus) with complications ([place]) 00/92/0041  ? OSTEOPENIA 01/21/2009  ? UNSPECIFIED VITAMIN D DEFICIENCY 11/19/2007  ? HYPERCHOLESTEROLEMIA 10/25/2006  ? GASTROESOPHAGEAL REFLUX, NO ESOPHAGITIS 10/25/2006  ? DIVERTICULOSIS OF COLON 10/25/2006  ? Osteoarthritis 10/25/2006  ? CERVICAL SPINE DISORDER, NOS 10/25/2006  ? ? ?,ANGIE, PTA ?12/15/2021, 11:43 AM ? ?[facility] ?[address] ?[address] ?[place], [place], [address] ?Phone: [phone]   Fax:  [phone] ? ?Name: Karen Jackson ?MRN: [id number] ?Date of Birth: Apr 22, 1941 ? ? ? ?

## 2021-12-16 DIAGNOSIS — H5213 Myopia, bilateral: Secondary | ICD-10-CM | POA: Diagnosis not present

## 2021-12-16 DIAGNOSIS — H26492 Other secondary cataract, left eye: Secondary | ICD-10-CM | POA: Diagnosis not present

## 2021-12-16 DIAGNOSIS — Z961 Presence of intraocular lens: Secondary | ICD-10-CM | POA: Diagnosis not present

## 2021-12-16 DIAGNOSIS — E119 Type 2 diabetes mellitus without complications: Secondary | ICD-10-CM | POA: Diagnosis not present

## 2021-12-19 ENCOUNTER — Encounter: Payer: Self-pay | Admitting: Physical Therapy

## 2021-12-19 ENCOUNTER — Ambulatory Visit: Payer: Medicare Other | Admitting: Physical Therapy

## 2021-12-19 DIAGNOSIS — I69354 Hemiplegia and hemiparesis following cerebral infarction affecting left non-dominant side: Secondary | ICD-10-CM

## 2021-12-19 DIAGNOSIS — M25572 Pain in left ankle and joints of left foot: Secondary | ICD-10-CM

## 2021-12-19 DIAGNOSIS — R270 Ataxia, unspecified: Secondary | ICD-10-CM

## 2021-12-19 DIAGNOSIS — M6281 Muscle weakness (generalized): Secondary | ICD-10-CM

## 2021-12-19 DIAGNOSIS — R262 Difficulty in walking, not elsewhere classified: Secondary | ICD-10-CM | POA: Diagnosis not present

## 2021-12-19 DIAGNOSIS — I6990 Unspecified sequelae of unspecified cerebrovascular disease: Secondary | ICD-10-CM | POA: Diagnosis not present

## 2021-12-19 DIAGNOSIS — M25512 Pain in left shoulder: Secondary | ICD-10-CM | POA: Diagnosis not present

## 2021-12-19 NOTE — Therapy (Signed)
[facility] ?[place] ?[address] ?[place], [place], [address] ?Phone: [phone]   Fax:  [phone] ? ?Physical Therapy Treatment ? ?Patient Details  ?Name: Karen Jackson ?MRN: [id number] ?Date of Birth: [birth year] ?Referring Provider (PT): Hensel ? ? ?Encounter Date: 12/19/2021 ? ? PT End of Session - 12/19/21 1141   ? ? Visit Number 31   ? Date for PT Re-Evaluation 01/07/22   ? Authorization Type Medicare   ? PT Start Time 1055   ? PT Stop Time 3267   ? PT Time Calculation (min) 47 min   ? Activity Tolerance Patient limited by pain;Patient limited by fatigue   ? Behavior During Therapy [facility] for tasks assessed/performed   ? ?  ?  ? ?  ? ? ?Past Medical History:  ?Diagnosis Date  ? Acute cystitis without hematuria   ? Acute diastolic CHF (congestive heart failure) ([place])   ? Arthritis   ? Dyspnea   ? Dysrhythmia   ? Fever of unknown origin 03/19/2017  ? Hyperlipidemia   ? Hypertension   ? denies at preop  ? Multifocal pneumonia   ? Neuromuscular disorder ([place])   ? neuropathy left arm and foot  ? Osteopenia   ? Paralysis ([place])   ? partial left side from CVA   ? Persistent atrial fibrillation ([place])   ? PONV (postoperative nausea and vomiting)   ? Pre-diabetes   ? Stroke [facility]) 2013  ? hemmorahgic  ? ? ?Past Surgical History:  ?Procedure Laterality Date  ? ANKLE SURGERY    ? APPENDECTOMY    ? CHOLECYSTECTOMY    ? HERNIA REPAIR    ? Esophagus  ? JOINT REPLACEMENT    ? total- right partial- left  ? MASTECTOMY PARTIAL / LUMPECTOMY  2012  ? left  ? ORIF ANKLE FRACTURE Left 07/20/2018  ? Procedure: OPEN REDUCTION INTERNAL FIXATION (ORIF) ANKLE FRACTURE;  Surgeon: Wylene Simmer, MD;  Location: [facility];  Service: Orthopedics;  Laterality: Left;  ? TOTAL KNEE ARTHROPLASTY Left 01/27/2019  ? Procedure: TOTAL KNEE ARTHROPLASTY;  Surgeon: Vickey Huger, MD;  [facility];  Service: Orthopedics;  Laterality: Left;  ? ? ?There were no vitals filed for this visit. ? ? Subjective Assessment -  12/19/21 1101   ? ? Subjective Patient reports that she has been pulling back more and has not been transferring well, having difficulty walking   ? Currently in Pain? Yes   ? Pain Score 4    ? Pain Location Ankle   ? Pain Orientation Left   ? Pain Descriptors / Indicators Sore   ? ?  ?  ? ?  ? ? ? ? ? ? ? ? ? ? ? ? ? ? ? ? ? ? ? ? [facility] Adult PT Treatment/Exercise - 12/19/21 0001   ? ?  ? Ambulation/Gait  ? Gait Comments Ambulation 66 feet and had to sit due to left knee pain and SOB, 2nd attempt ambulation 10' and stopped due to SOB, 3rd attempt she had some left ankle pain and stopped at 55' due to SOB   ?  ? High Level Balance  ? High Level Balance Comments 2" toe taps with FWW, working on standing  in walker to time and cues for posture, weight shifts   ?  ? Knee/Hip Exercises: Aerobic  ? Nustep level 5x 7.5 minutes   ?  ? Knee/Hip Exercises: Seated  ? Other Seated Knee/Hip Exercises red tband left hip adduction   ?  Marching Both;3 sets;10 reps   ? Marching Weights 5 lbs.   ? ?  ?  ? ?  ? ? ? ? ? ? ? ? ? ? ? ? PT Short Term Goals - 11/01/21 1056   ? ?  ? PT SHORT TERM GOAL #1  ? Title independent with initial HEP   ? Status Achieved   ? ?  ?  ? ?  ? ? ? ? PT Long Term Goals - 12/15/21 1058   ? ?  ? PT LONG TERM GOAL #1  ? Title walk with FWW x 150 feet without rest with supervision only   ? Status On-going   ?  ? PT LONG TERM GOAL #2  ? Title increase left knee strength to 4/5   ? Baseline quad 4/5, HS 4-/5   ? Status Partially Met   ?  ? PT LONG TERM GOAL #3  ? Title decrease TUG to 33 seconds   ? Baseline 40.74 sec very slow with turns   ? Status On-going   ?  ? PT LONG TERM GOAL #4  ? Title transfer independently with set up   ? Status On-going   ?  ? PT LONG TERM GOAL #5  ? Title report pain in the shoulder and ankle 50% less   ? Status On-going   ? ?  ?  ? ?  ? ? ? ? ? ? ? ? Plan - 12/19/21 1142   ? ? Clinical Impression Statement Patient reports that she saw the pulmonologist office last week.  She reports  she is going to change lasix and how she does the nebulizer.  Today she was having some left knee and then some left ankle pain, she was limited in her ability to walk, due to this and her SOB. She reports that she will return to the cardiologist in the next month, I told her my concerns af the regression in her abilities, where about 2 months ago she was able to go a total of 300 feet with rests in a session and up to 125' without rest at a time.  She did have some left knee welling a few weeks ago that seems to have set her back as well.   ? PT Next Visit Plan I will continue to see if we can get her walking more distance to her PLOF   ? Consulted and Agree with Plan of Care Patient   ? ?  ?  ? ?  ? ? ?Patient will benefit from skilled therapeutic intervention in order to improve the following deficits and impairments:  Abnormal gait, Decreased range of motion, Difficulty walking, Impaired UE functional use, Increased muscle spasms, Cardiopulmonary status limiting activity, Decreased endurance, Decreased activity tolerance, Pain, Decreased balance, Decreased mobility, Decreased strength ? ?Visit Diagnosis: ?Muscle weakness (generalized) ? ?Pain in left ankle and joints of left foot ? ?Difficulty in walking, not elsewhere classified ? ?Late effects of CVA (cerebrovascular accident) ? ?Left shoulder pain, unspecified chronicity ? ?Hemiplegia and hemiparesis following cerebral infarction affecting left non-dominant side ([place]) ? ?Ataxia ? ? ? ? ?Problem List ?Patient Active Problem List  ? Diagnosis Date Noted  ? Secondary hypercoagulable state ([place]) 03/16/2021  ? Dizziness 03/10/2021  ? Orthostatic hypotension 10/15/2020  ? Presbycusis of both ears 03/08/2020  ? Mixed stress and urge urinary incontinence 12/02/2019  ? Macrocytosis 12/01/2019  ? Nutritional anemia 12/01/2019  ? S/P total knee replacement 01/27/2019  ? Recurrent left knee instability  07/05/2018  ? Respiratory failure with hypoxia ([place]) 08/30/2017  ?  Hypoxemia   ? Heart failure with preserved ejection fraction ([place]), Grade 3 diastolic dysfunction 00/63/4949  ? PAF (paroxysmal atrial fibrillation) ([place])   ? Dyspnea 03/19/2017  ? Encounter for preventive health examination 02/17/2016  ? Sensorineural hearing loss (SNHL), bilateral 01/26/2016  ? Morbid obesity ([place]) 06/17/2015  ? Hypomagnesemia 04/24/2014  ? Hemiparesis affecting left side as late effect of cerebrovascular accident ([place]) 04/24/2014  ? Nontraumatic cerebral hemorrhage ([place]) 04/30/2012  ? DM (diabetes mellitus) with complications ([place]) 44/73/9584  ? OSTEOPENIA 01/21/2009  ? UNSPECIFIED VITAMIN D DEFICIENCY 11/19/2007  ? HYPERCHOLESTEROLEMIA 10/25/2006  ? GASTROESOPHAGEAL REFLUX, NO ESOPHAGITIS 10/25/2006  ? DIVERTICULOSIS OF COLON 10/25/2006  ? Osteoarthritis 10/25/2006  ? CERVICAL SPINE DISORDER, NOS 10/25/2006  ? ? Sumner Boast, PT ?12/19/2021, 11:49 AM ? ?[facility] ?[place] ?[address] ?[place], [place], [address] ?Phone: [phone]   Fax:  [phone] ? ?Name: Karen Jackson ?MRN: [id number] ?Date of Birth: March 12, 1941 ? ? ? ?

## 2021-12-20 DIAGNOSIS — H6123 Impacted cerumen, bilateral: Secondary | ICD-10-CM | POA: Diagnosis not present

## 2021-12-22 ENCOUNTER — Encounter: Payer: Self-pay | Admitting: Physical Therapy

## 2021-12-22 ENCOUNTER — Ambulatory Visit: Payer: Medicare Other | Admitting: Physical Therapy

## 2021-12-22 DIAGNOSIS — I69354 Hemiplegia and hemiparesis following cerebral infarction affecting left non-dominant side: Secondary | ICD-10-CM

## 2021-12-22 DIAGNOSIS — M6281 Muscle weakness (generalized): Secondary | ICD-10-CM

## 2021-12-22 DIAGNOSIS — M25512 Pain in left shoulder: Secondary | ICD-10-CM | POA: Diagnosis not present

## 2021-12-22 DIAGNOSIS — R262 Difficulty in walking, not elsewhere classified: Secondary | ICD-10-CM

## 2021-12-22 DIAGNOSIS — M25572 Pain in left ankle and joints of left foot: Secondary | ICD-10-CM

## 2021-12-22 DIAGNOSIS — R270 Ataxia, unspecified: Secondary | ICD-10-CM

## 2021-12-22 DIAGNOSIS — I6990 Unspecified sequelae of unspecified cerebrovascular disease: Secondary | ICD-10-CM

## 2021-12-22 NOTE — Therapy (Signed)
[facility] ?[place] ?[address] ?[place], [place], [address] ?Phone: [phone]   Fax:  [phone] ? ?Physical Therapy Treatment ? ?Patient Details  ?Name: Karen Jackson ?MRN: [id number] ?Date of Birth: 1940-10-01 ?Referring Provider (PT): Hensel ? ? ?Encounter Date: 12/22/2021 ? ? PT End of Session - 12/22/21 1153   ? ? Visit Number 32   ? Date for PT Re-Evaluation 01/07/22   ? Authorization Type Medicare   ? PT Start Time 1054   ? PT Stop Time 1145   ? PT Time Calculation (min) 51 min   ? Activity Tolerance Patient limited by pain;Patient limited by fatigue   ? Behavior During Therapy [facility] for tasks assessed/performed   ? ?  ?  ? ?  ? ? ?Past Medical History:  ?Diagnosis Date  ? Acute cystitis without hematuria   ? Acute diastolic CHF (congestive heart failure) ([place])   ? Arthritis   ? Dyspnea   ? Dysrhythmia   ? Fever of unknown origin 03/19/2017  ? Hyperlipidemia   ? Hypertension   ? denies at preop  ? Multifocal pneumonia   ? Neuromuscular disorder ([place])   ? neuropathy left arm and foot  ? Osteopenia   ? Paralysis ([place])   ? partial left side from CVA   ? Persistent atrial fibrillation ([place])   ? PONV (postoperative nausea and vomiting)   ? Pre-diabetes   ? Stroke [facility]) 2013  ? hemmorahgic  ? ? ?Past Surgical History:  ?Procedure Laterality Date  ? ANKLE SURGERY    ? APPENDECTOMY    ? CHOLECYSTECTOMY    ? HERNIA REPAIR    ? Esophagus  ? JOINT REPLACEMENT    ? total- right partial- left  ? MASTECTOMY PARTIAL / LUMPECTOMY  2012  ? left  ? ORIF ANKLE FRACTURE Left 07/20/2018  ? Procedure: OPEN REDUCTION INTERNAL FIXATION (ORIF) ANKLE FRACTURE;  Surgeon: Wylene Simmer, MD;  Location: [facility];  Service: Orthopedics;  Laterality: Left;  ? TOTAL KNEE ARTHROPLASTY Left 01/27/2019  ? Procedure: TOTAL KNEE ARTHROPLASTY;  Surgeon: Vickey Huger, MD;  [facility];  Service: Orthopedics;  Laterality: Left;  ? ? ?There were no vitals filed for this visit. ? ? Subjective Assessment -  12/22/21 1059   ? ? Subjective I am feeling better today, less soreness and pain.  Reports left ankle rolling some   ? Currently in Pain? No/denies   ? ?  ?  ? ?  ? ? ? ? ? ? ? ? ? ? ? ? ? ? ? ? ? ? ? ? [facility] Adult PT Treatment/Exercise - 12/22/21 0001   ? ?  ? Ambulation/Gait  ? Gait Comments Ambulated 70 feet x3 with CGA and chair following behind.   ?  ? High Level Balance  ? High Level Balance Comments 2" toe taps with FWW, working on standing  in walker to time and cues for posture, weight shifts side to side and then staggered step to get the ankle and the hip to activate   ?  ? Knee/Hip Exercises: Stretches  ? Gastroc Stretch Left;3 reps;20 seconds   ?  ? Knee/Hip Exercises: Aerobic  ? Nustep level 5x 7.5 minutes   ?  ? Knee/Hip Exercises: Standing  ? Hip Flexion Both;2 sets;10 reps   ? Hip Flexion Limitations in [place]   ?  ? Knee/Hip Exercises: Seated  ? Long Arc [employer] Strengthening;10 reps;3 sets   ? Long Arc [employer]  5 lbs.   ? Other Seated Knee/Hip Exercises left ankle yellow tband eversion   ? Other Seated Knee/Hip Exercises red tband left hip adduction   ? ?  ?  ? ?  ? ? ? ? ? ? ? ? ? ? ? ? PT Short Term Goals - 11/01/21 1056   ? ?  ? PT SHORT TERM GOAL #1  ? Title independent with initial HEP   ? Status Achieved   ? ?  ?  ? ?  ? ? ? ? PT Long Term Goals - 12/22/21 1156   ? ?  ? PT LONG TERM GOAL #1  ? Title walk with FWW x 150 feet without rest with supervision only   ? Status On-going   ? ?  ?  ? ?  ? ? ? ? ? ? ? ? Plan - 12/22/21 1153   ? ? Clinical Impression Statement Patient had the left ankle rolling today, I went aheadd and took her brace off and we really focused on left ankle eversion, she has the motion and can do against resistance with yellow tband.  She did tolerate the walking better today with her fatigue and shortness of breath, I did try to educate her on breathing techniques during walking, she tends to either hold her breath or counts and tries to talk with walking and this factors  into the SOB   ? PT Next Visit Plan I will continue to see if we can get her walking more distance to her PLOF   ? Consulted and Agree with Plan of Care Patient   ? ?  ?  ? ?  ? ? ?Patient will benefit from skilled therapeutic intervention in order to improve the following deficits and impairments:  Abnormal gait, Decreased range of motion, Difficulty walking, Impaired UE functional use, Increased muscle spasms, Cardiopulmonary status limiting activity, Decreased endurance, Decreased activity tolerance, Pain, Decreased balance, Decreased mobility, Decreased strength ? ?Visit Diagnosis: ?Muscle weakness (generalized) ? ?Pain in left ankle and joints of left foot ? ?Difficulty in walking, not elsewhere classified ? ?Late effects of CVA (cerebrovascular accident) ? ?Left shoulder pain, unspecified chronicity ? ?Hemiplegia and hemiparesis following cerebral infarction affecting left non-dominant side ([place]) ? ?Ataxia ? ? ? ? ?Problem List ?Patient Active Problem List  ? Diagnosis Date Noted  ? Secondary hypercoagulable state ([place]) 03/16/2021  ? Dizziness 03/10/2021  ? Orthostatic hypotension 10/15/2020  ? Presbycusis of both ears 03/08/2020  ? Mixed stress and urge urinary incontinence 12/02/2019  ? Macrocytosis 12/01/2019  ? Nutritional anemia 12/01/2019  ? S/P total knee replacement 01/27/2019  ? Recurrent left knee instability 07/05/2018  ? Respiratory failure with hypoxia ([place]) 08/30/2017  ? Hypoxemia   ? Heart failure with preserved ejection fraction ([place]), Grade 3 diastolic dysfunction 16/05/9603  ? PAF (paroxysmal atrial fibrillation) ([place])   ? Dyspnea 03/19/2017  ? Encounter for preventive health examination 02/17/2016  ? Sensorineural hearing loss (SNHL), bilateral 01/26/2016  ? Morbid obesity ([place]) 06/17/2015  ? Hypomagnesemia 04/24/2014  ? Hemiparesis affecting left side as late effect of cerebrovascular accident ([place]) 04/24/2014  ? Nontraumatic cerebral hemorrhage ([place]) 04/30/2012  ? DM (diabetes mellitus)  with complications ([place]) 54/04/8118  ? OSTEOPENIA 01/21/2009  ? UNSPECIFIED VITAMIN D DEFICIENCY 11/19/2007  ? HYPERCHOLESTEROLEMIA 10/25/2006  ? GASTROESOPHAGEAL REFLUX, NO ESOPHAGITIS 10/25/2006  ? DIVERTICULOSIS OF COLON 10/25/2006  ? Osteoarthritis 10/25/2006  ? CERVICAL SPINE DISORDER, NOS 10/25/2006  ? ? Sumner Boast, PT ?12/22/2021, 11:56 AM ? ?[facility] ?[place] ?[address] ?[place], [place], [address] ?Phone: [phone]   Fax:  [phone] ? ?Name: Karen Jackson ?MRN: [id number] ?Date of Birth: Feb 10, 1941 ? ? ? ?

## 2021-12-26 ENCOUNTER — Telehealth: Payer: Self-pay

## 2021-12-26 ENCOUNTER — Encounter: Payer: Self-pay | Admitting: Physical Therapy

## 2021-12-26 ENCOUNTER — Ambulatory Visit: Payer: Medicare Other | Attending: Family Medicine | Admitting: Physical Therapy

## 2021-12-26 DIAGNOSIS — M25512 Pain in left shoulder: Secondary | ICD-10-CM | POA: Insufficient documentation

## 2021-12-26 DIAGNOSIS — R270 Ataxia, unspecified: Secondary | ICD-10-CM | POA: Diagnosis not present

## 2021-12-26 DIAGNOSIS — R262 Difficulty in walking, not elsewhere classified: Secondary | ICD-10-CM | POA: Insufficient documentation

## 2021-12-26 DIAGNOSIS — I69354 Hemiplegia and hemiparesis following cerebral infarction affecting left non-dominant side: Secondary | ICD-10-CM | POA: Diagnosis not present

## 2021-12-26 DIAGNOSIS — M25572 Pain in left ankle and joints of left foot: Secondary | ICD-10-CM | POA: Diagnosis not present

## 2021-12-26 DIAGNOSIS — M6281 Muscle weakness (generalized): Secondary | ICD-10-CM | POA: Diagnosis not present

## 2021-12-26 DIAGNOSIS — I6990 Unspecified sequelae of unspecified cerebrovascular disease: Secondary | ICD-10-CM | POA: Diagnosis not present

## 2021-12-26 NOTE — Therapy (Signed)
?[place] [address] ?[place], [place], [address] ?Phone: [phone]   Fax:  [phone] ? ?Physical Therapy Treatment ? ?Patient Details  ?Name: Karen Jackson ?MRN: [id number] ?Date of Birth: [birth year] ?Referring Provider (PT): Hensel ? ? ?Encounter Date: 12/26/2021 ? ? PT End of Session - 12/26/21 1158   ? ? Visit Number 33   ? Authorization Type Medicare   ? PT Start Time 1010   ? PT Stop Time 1050   ? PT Time Calculation (min) 40 min   ? Activity Tolerance Patient limited by fatigue   ? Behavior During Therapy [facility] for tasks assessed/performed   ? ?  ?  ? ?  ? ? ?Past Medical History:  ?Diagnosis Date  ? Acute cystitis without hematuria   ? Acute diastolic CHF (congestive heart failure) ([place])   ? Arthritis   ? Dyspnea   ? Dysrhythmia   ? Fever of unknown origin 03/19/2017  ? Hyperlipidemia   ? Hypertension   ? denies at preop  ? Multifocal pneumonia   ? Neuromuscular disorder ([place])   ? neuropathy left arm and foot  ? Osteopenia   ? Paralysis ([place])   ? partial left side from CVA   ? Persistent atrial fibrillation ([place])   ? PONV (postoperative nausea and vomiting)   ? Pre-diabetes   ? Stroke [facility]) 2013  ? hemmorahgic  ? ? ?Past Surgical History:  ?Procedure Laterality Date  ? ANKLE SURGERY    ? APPENDECTOMY    ? CHOLECYSTECTOMY    ? HERNIA REPAIR    ? Esophagus  ? JOINT REPLACEMENT    ? total- right partial- left  ? MASTECTOMY PARTIAL / LUMPECTOMY  2012  ? left  ? ORIF ANKLE FRACTURE Left 07/20/2018  ? Procedure: OPEN REDUCTION INTERNAL FIXATION (ORIF) ANKLE FRACTURE;  Surgeon: Wylene Simmer, MD;  Location: [place];  Service: Orthopedics;  Laterality: Left;  ? TOTAL KNEE ARTHROPLASTY Left 01/27/2019  ? Procedure: TOTAL KNEE ARTHROPLASTY;  Surgeon: Vickey Huger, MD;  [facility];  Service: Orthopedics;  Laterality: Left;  ? ? ?There were no vitals filed for this visit. ? ? Subjective Assessment - 12/26/21 1028   ? ? Subjective I did okay one day was light  headed, think it was from all the lasix, called the MD, he may give me potassium   ? Currently in Pain? No/denies   ? ?  ?  ? ?  ? ? ? ? ? ? ? ? ? ? ? ? ? ? ? ? ? ? ? ? [place] Adult PT Treatment/Exercise - 12/26/21 0001   ? ?  ? Ambulation/Gait  ? Gait Comments ambulated 50', 35', 35, and 50', she really struggled today with her breathing   ?  ? Knee/Hip Exercises: Aerobic  ? Nustep level 5x 8 minutes   ?  ? Knee/Hip Exercises: Standing  ? Hip Abduction 2 sets;10 reps;Both   ? Abduction Limitations 2.5#   ?  ? Knee/Hip Exercises: Seated  ? Long Arc [employer] Strengthening;10 reps;3 sets   ? Long Arc Quad Weight 5 lbs.   ? Other Seated Knee/Hip Exercises left ankle yellow tband eversion   ? Other Seated Knee/Hip Exercises red tband left hip adduction, ankle PF/DF   ? Marching Both;3 sets;10 reps   ? Marching Weights 5 lbs.   ? ?  ?  ? ?  ? ? ? ? ? ? ? ? ? ? ? ? PT Short  Term Goals - 11/01/21 1056   ? ?  ? PT SHORT TERM GOAL #1  ? Title independent with initial HEP   ? Status Achieved   ? ?  ?  ? ?  ? ? ? ? PT Long Term Goals - 12/26/21 1201   ? ?  ? PT LONG TERM GOAL #1  ? Title walk with FWW x 150 feet without rest with supervision only   ? Status On-going   ? ?  ?  ? ?  ? ? ? ? ? ? ? ? Plan - 12/26/21 1158   ? ? Clinical Impression Statement Patient really struggled today with her breathing, unable go more than 51' due to SOB, she reports that she was put on lasix to help this but over the past few weeks she has been regressing with her ability to be ambulatory due to the fatigue.  She is having less knee and ankle pain during this time but the breathing is more difficult, her O2 sats stay in the mid 90's   ? PT Next Visit Plan see if the SOB eases   ? Consulted and Agree with Plan of Care Patient   ? ?  ?  ? ?  ? ? ?Patient will benefit from skilled therapeutic intervention in order to improve the following deficits and impairments:  Abnormal gait, Decreased range of motion, Difficulty walking, Impaired UE functional  use, Increased muscle spasms, Cardiopulmonary status limiting activity, Decreased endurance, Decreased activity tolerance, Pain, Decreased balance, Decreased mobility, Decreased strength ? ?Visit Diagnosis: ?Muscle weakness (generalized) ? ?Pain in left ankle and joints of left foot ? ?Difficulty in walking, not elsewhere classified ? ?Late effects of CVA (cerebrovascular accident) ? ?Left shoulder pain, unspecified chronicity ? ?Hemiplegia and hemiparesis following cerebral infarction affecting left non-dominant side ([place]) ? ?Ataxia ? ? ? ? ?Problem List ?Patient Active Problem List  ? Diagnosis Date Noted  ? Secondary hypercoagulable state ([place]) 03/16/2021  ? Dizziness 03/10/2021  ? Orthostatic hypotension 10/15/2020  ? Presbycusis of both ears 03/08/2020  ? Mixed stress and urge urinary incontinence 12/02/2019  ? Macrocytosis 12/01/2019  ? Nutritional anemia 12/01/2019  ? S/P total knee replacement 01/27/2019  ? Recurrent left knee instability 07/05/2018  ? Respiratory failure with hypoxia ([place]) 08/30/2017  ? Hypoxemia   ? Heart failure with preserved ejection fraction ([place]), Grade 3 diastolic dysfunction 94/17/4081  ? PAF (paroxysmal atrial fibrillation) ([place])   ? Dyspnea 03/19/2017  ? Encounter for preventive health examination 02/17/2016  ? Sensorineural hearing loss (SNHL), bilateral 01/26/2016  ? Morbid obesity ([place]) 06/17/2015  ? Hypomagnesemia 04/24/2014  ? Hemiparesis affecting left side as late effect of cerebrovascular accident ([place]) 04/24/2014  ? Nontraumatic cerebral hemorrhage ([place]) 04/30/2012  ? DM (diabetes mellitus) with complications ([place]) 44/81/8563  ? OSTEOPENIA 01/21/2009  ? UNSPECIFIED VITAMIN D DEFICIENCY 11/19/2007  ? HYPERCHOLESTEROLEMIA 10/25/2006  ? GASTROESOPHAGEAL REFLUX, NO ESOPHAGITIS 10/25/2006  ? DIVERTICULOSIS OF COLON 10/25/2006  ? Osteoarthritis 10/25/2006  ? CERVICAL SPINE DISORDER, NOS 10/25/2006  ? ? Sumner Boast, PT ?12/26/2021, 12:02 PM ? ?[place] ?[place] [address] ?[place], [place], [address] ?Phone: [phone]   Fax:  [phone] ? ?Name: MARCINE GADWAY ?MRN: [id number] ?Date of Birth: 1941-01-26 ? ? ? ?

## 2021-12-26 NOTE — Telephone Encounter (Signed)
Patient calls nurse line reporting adverse reaction to lasix  ? ?Patient reports her knee has been swelling and was told to double up on lasix. Patient reports she took #2 '80mg'$  on Wednesday and Thursday of last week. Patient reports on Friday she was dizziness and dehydrated. Patient reports after she ate and had fluids her symptoms improved.  ? ?Patient went back down to '80mg'$  on Friday and has remained at that dose. Patient reports she feels great. Denies SOB, chest pains, vision changes or dizziness. Patient reports normal urine output.  ? ?Patient is requesting to start potassium as she was told by a "friend" potassium may help off set the lasix.  ? ?Patient declined apt. Patient reports she has one with PCP scheduled for 5/22. ? ?Red flags discussed with patient.  ? ?Will forward to PCP.  ?

## 2021-12-26 NOTE — Telephone Encounter (Signed)
Noted and agree. 

## 2021-12-27 ENCOUNTER — Encounter: Payer: Medicare Other | Admitting: Physical Therapy

## 2021-12-28 ENCOUNTER — Encounter: Payer: Self-pay | Admitting: Family Medicine

## 2021-12-28 LAB — HM DIABETES EYE EXAM

## 2021-12-28 NOTE — Telephone Encounter (Signed)
Called patient but she did not answer. Left message for her to call us back.  

## 2021-12-29 ENCOUNTER — Ambulatory Visit: Payer: Medicare Other | Admitting: Physical Therapy

## 2021-12-29 ENCOUNTER — Encounter: Payer: Self-pay | Admitting: Physical Therapy

## 2021-12-29 DIAGNOSIS — M25512 Pain in left shoulder: Secondary | ICD-10-CM

## 2021-12-29 DIAGNOSIS — I69354 Hemiplegia and hemiparesis following cerebral infarction affecting left non-dominant side: Secondary | ICD-10-CM | POA: Diagnosis not present

## 2021-12-29 DIAGNOSIS — I6990 Unspecified sequelae of unspecified cerebrovascular disease: Secondary | ICD-10-CM | POA: Diagnosis not present

## 2021-12-29 DIAGNOSIS — R262 Difficulty in walking, not elsewhere classified: Secondary | ICD-10-CM | POA: Diagnosis not present

## 2021-12-29 DIAGNOSIS — M6281 Muscle weakness (generalized): Secondary | ICD-10-CM | POA: Diagnosis not present

## 2021-12-29 DIAGNOSIS — R270 Ataxia, unspecified: Secondary | ICD-10-CM

## 2021-12-29 DIAGNOSIS — M25572 Pain in left ankle and joints of left foot: Secondary | ICD-10-CM | POA: Diagnosis not present

## 2021-12-29 NOTE — Therapy (Signed)
[place] ?[address] ?[place], [place], [address] ?Phone: [phone]   Fax:  [phone] ? ?Physical Therapy Treatment ? ?Patient Details  ?Name: Karen Jackson ?MRN: [id number] ?Date of Birth: Nov 22, 1940 ?Referring Provider (PT): Hensel ? ? ?Encounter Date: 12/29/2021 ? ? PT End of Session - 12/29/21 1219   ? ? Visit Number 34   ? Date for PT Re-Evaluation 01/07/22   ? Authorization Type Medicare   ? PT Start Time 1055   ? PT Stop Time 5462   ? PT Time Calculation (min) 47 min   ? Activity Tolerance Patient limited by fatigue   ? Behavior During Therapy [facility] for tasks assessed/performed   ? ?  ?  ? ?  ? ? ?Past Medical History:  ?Diagnosis Date  ? Acute cystitis without hematuria   ? Acute diastolic CHF (congestive heart failure) ([place])   ? Arthritis   ? Dyspnea   ? Dysrhythmia   ? Fever of unknown origin 03/19/2017  ? Hyperlipidemia   ? Hypertension   ? denies at preop  ? Multifocal pneumonia   ? Neuromuscular disorder ([place])   ? neuropathy left arm and foot  ? Osteopenia   ? Paralysis ([place])   ? partial left side from CVA   ? Persistent atrial fibrillation ([place])   ? PONV (postoperative nausea and vomiting)   ? Pre-diabetes   ? Stroke [facility]) 2013  ? hemmorahgic  ? ? ?Past Surgical History:  ?Procedure Laterality Date  ? ANKLE SURGERY    ? APPENDECTOMY    ? CHOLECYSTECTOMY    ? HERNIA REPAIR    ? Esophagus  ? JOINT REPLACEMENT    ? total- right partial- left  ? MASTECTOMY PARTIAL / LUMPECTOMY  2012  ? left  ? ORIF ANKLE FRACTURE Left 07/20/2018  ? Procedure: OPEN REDUCTION INTERNAL FIXATION (ORIF) ANKLE FRACTURE;  Surgeon: Wylene Simmer, MD;  Location: [place];  Service: Orthopedics;  Laterality: Left;  ? TOTAL KNEE ARTHROPLASTY Left 01/27/2019  ? Procedure: TOTAL KNEE ARTHROPLASTY;  Surgeon: Vickey Huger, MD;  [facility];  Service: Orthopedics;  Laterality: Left;  ? ? ?There were no vitals filed for this visit. ? ? Subjective Assessment - 12/29/21 1103   ? ?  Subjective Reports took two lasix yesterday.  Feeling a little   ? ?  ?  ? ?  ? ? ? ? ? ? ? ? ? ? ? ? ? ? ? ? ? ? ? ? [place] Adult PT Treatment/Exercise - 12/29/21 0001   ? ?  ? Ambulation/Gait  ? Gait Comments ambulated 33' x 4, could not go further today due to her c/o SOB   ?  ? High Level Balance  ? High Level Balance Comments 4" toe taps using walker.   ?  ? Knee/Hip Exercises: Aerobic  ? Nustep level 5x 8 minutes   ?  ? Knee/Hip Exercises: Seated  ? Other Seated Knee/Hip Exercises all seated exercises today were on the mat table without back support this was very difficult for her needing constant cues to lean forward and to the middle/left as she leans way back and to the right, LAQ, marches, ankle and core exercises were performed   ? ?  ?  ? ?  ? ? ? ? ? ? ? ? ? ? ? ? PT Short Term Goals - 11/01/21 1056   ? ?  ? PT SHORT TERM GOAL #1  ? Title independent  with initial HEP   ? Status Achieved   ? ?  ?  ? ?  ? ? ? ? PT Long Term Goals - 12/26/21 1201   ? ?  ? PT LONG TERM GOAL #1  ? Title walk with FWW x 150 feet without rest with supervision only   ? Status On-going   ? ?  ?  ? ?  ? ? ? ? ? ? ? ? Plan - 12/29/21 1220   ? ? Clinical Impression Statement concerns about the continued regression, her O2 saturations remain above 91% for all activties, she just has regressed in her walking distance from 120 feet about 3 weeks ago at a time to 44' today and has to stop due to fatigue and reports SOB.  I tried to do more sitting acitvies but without back support and this was very difficult for her   ? PT Next Visit Plan she sees the pulmonologist in 2 weeks   ? Consulted and Agree with Plan of Care Patient   ? ?  ?  ? ?  ? ? ?Patient will benefit from skilled therapeutic intervention in order to improve the following deficits and impairments:  Abnormal gait, Decreased range of motion, Difficulty walking, Impaired UE functional use, Increased muscle spasms, Cardiopulmonary status limiting activity, Decreased endurance,  Decreased activity tolerance, Pain, Decreased balance, Decreased mobility, Decreased strength ? ?Visit Diagnosis: ?Muscle weakness (generalized) ? ?Pain in left ankle and joints of left foot ? ?Difficulty in walking, not elsewhere classified ? ?Late effects of CVA (cerebrovascular accident) ? ?Left shoulder pain, unspecified chronicity ? ?Hemiplegia and hemiparesis following cerebral infarction affecting left non-dominant side ([place]) ? ?Ataxia ? ? ? ? ?Problem List ?Patient Active Problem List  ? Diagnosis Date Noted  ? Secondary hypercoagulable state ([place]) 03/16/2021  ? Dizziness 03/10/2021  ? Orthostatic hypotension 10/15/2020  ? Presbycusis of both ears 03/08/2020  ? Mixed stress and urge urinary incontinence 12/02/2019  ? Macrocytosis 12/01/2019  ? Nutritional anemia 12/01/2019  ? S/P total knee replacement 01/27/2019  ? Recurrent left knee instability 07/05/2018  ? Respiratory failure with hypoxia ([place]) 08/30/2017  ? Hypoxemia   ? Heart failure with preserved ejection fraction ([place]), Grade 3 diastolic dysfunction 56/25/6389  ? PAF (paroxysmal atrial fibrillation) ([place])   ? Dyspnea 03/19/2017  ? Encounter for preventive health examination 02/17/2016  ? Sensorineural hearing loss (SNHL), bilateral 01/26/2016  ? Morbid obesity ([place]) 06/17/2015  ? Hypomagnesemia 04/24/2014  ? Hemiparesis affecting left side as late effect of cerebrovascular accident ([place]) 04/24/2014  ? Nontraumatic cerebral hemorrhage ([place]) 04/30/2012  ? DM (diabetes mellitus) with complications ([place]) 37/34/2876  ? OSTEOPENIA 01/21/2009  ? UNSPECIFIED VITAMIN D DEFICIENCY 11/19/2007  ? HYPERCHOLESTEROLEMIA 10/25/2006  ? GASTROESOPHAGEAL REFLUX, NO ESOPHAGITIS 10/25/2006  ? DIVERTICULOSIS OF COLON 10/25/2006  ? Osteoarthritis 10/25/2006  ? CERVICAL SPINE DISORDER, NOS 10/25/2006  ? ? Sumner Boast, PT ?12/29/2021, 12:23 PM ? ?[place] ?[address] ?[place], [place], [address] ?Phone:  [phone]   Fax:  [phone] ? ?Name: Karen Jackson ?MRN: [id number] ?Date of Birth: 31-Mar-1941 ? ? ? ?

## 2022-01-02 ENCOUNTER — Encounter: Payer: Self-pay | Admitting: Physical Therapy

## 2022-01-02 ENCOUNTER — Ambulatory Visit: Payer: Medicare Other | Admitting: Physical Therapy

## 2022-01-02 DIAGNOSIS — M6281 Muscle weakness (generalized): Secondary | ICD-10-CM

## 2022-01-02 DIAGNOSIS — I69354 Hemiplegia and hemiparesis following cerebral infarction affecting left non-dominant side: Secondary | ICD-10-CM

## 2022-01-02 DIAGNOSIS — M25572 Pain in left ankle and joints of left foot: Secondary | ICD-10-CM | POA: Diagnosis not present

## 2022-01-02 DIAGNOSIS — R270 Ataxia, unspecified: Secondary | ICD-10-CM

## 2022-01-02 DIAGNOSIS — M25512 Pain in left shoulder: Secondary | ICD-10-CM

## 2022-01-02 DIAGNOSIS — I6990 Unspecified sequelae of unspecified cerebrovascular disease: Secondary | ICD-10-CM

## 2022-01-02 DIAGNOSIS — R262 Difficulty in walking, not elsewhere classified: Secondary | ICD-10-CM | POA: Diagnosis not present

## 2022-01-02 NOTE — Therapy (Signed)
[place] ?[place] [address] ?[place], [place], [address] ?Phone: [phone]   Fax:  [phone] ? ?Physical Therapy Treatment ? ?Patient Details  ?Name: Karen Jackson ?MRN: [id number] ?Date of Birth: July 02, 1941 ?Referring Provider (PT): Hensel ? ? ?Encounter Date: 01/02/2022 ? ? PT End of Session - 01/02/22 1240   ? ? Visit Number 35   ? Date for PT Re-Evaluation 01/07/22   ? Authorization Type Medicare   ? PT Start Time 1050   ? PT Stop Time 1140   ? PT Time Calculation (min) 50 min   ? Activity Tolerance Patient limited by fatigue   ? Behavior During Therapy [facility] for tasks assessed/performed   ? ?  ?  ? ?  ? ? ?Past Medical History:  ?Diagnosis Date  ? Acute cystitis without hematuria   ? Acute diastolic CHF (congestive heart failure) ([place])   ? Arthritis   ? Dyspnea   ? Dysrhythmia   ? Fever of unknown origin 03/19/2017  ? Hyperlipidemia   ? Hypertension   ? denies at preop  ? Multifocal pneumonia   ? Neuromuscular disorder ([place])   ? neuropathy left arm and foot  ? Osteopenia   ? Paralysis ([place])   ? partial left side from CVA   ? Persistent atrial fibrillation ([place])   ? PONV (postoperative nausea and vomiting)   ? Pre-diabetes   ? Stroke [facility]) 2013  ? hemmorahgic  ? ? ?Past Surgical History:  ?Procedure Laterality Date  ? ANKLE SURGERY    ? APPENDECTOMY    ? CHOLECYSTECTOMY    ? HERNIA REPAIR    ? Esophagus  ? JOINT REPLACEMENT    ? total- right partial- left  ? MASTECTOMY PARTIAL / LUMPECTOMY  2012  ? left  ? ORIF ANKLE FRACTURE Left 07/20/2018  ? Procedure: OPEN REDUCTION INTERNAL FIXATION (ORIF) ANKLE FRACTURE;  Surgeon: Wylene Simmer, MD;  Location: [place];  Service: Orthopedics;  Laterality: Left;  ? TOTAL KNEE ARTHROPLASTY Left 01/27/2019  ? Procedure: TOTAL KNEE ARTHROPLASTY;  Surgeon: Vickey Huger, MD;  [facility];  Service: Orthopedics;  Laterality: Left;  ? ? ?There were no vitals filed for this visit. ? ? Subjective Assessment - 01/02/22 1238   ? ?  Subjective Patient reports that her son is in the hospital, reports in ICU, I am nervous but I could not stay home.   ? Currently in Pain? No/denies   ? ?  ?  ? ?  ? ? ? ? ? ? ? ? ? ? ? ? ? ? ? ? ? ? ? ? [place] Adult PT Treatment/Exercise - 01/02/22 0001   ? ?  ? Ambulation/Gait  ? Gait Comments 62' x3 with CGA and s/c following behind   ?  ? High Level Balance  ? High Level Balance Comments 4" toe taps using walker., weight shifts fwd/bkwd, side to side   ?  ? Knee/Hip Exercises: Seated  ? Long Arc [employer] Strengthening;10 reps;3 sets   ? Long Arc Quad Weight 5 lbs.   ? Other Seated Knee/Hip Exercises all seated exercises today were on the mat table without back support this was very difficult for her needing constant cues to lean forward and to the middle/left as she leans way back and to the right, LAQ, marches, ankle and core exercises were performed   ? Other Seated Knee/Hip Exercises red tband left hip adduction, ankle PF/DF   ? Marching Both;3 sets;10 reps   ?  Marching Weights 5 lbs.   ? ?  ?  ? ?  ? ? ? ? ? ? ? ? ? ? ? ? PT Short Term Goals - 11/01/21 1056   ? ?  ? PT SHORT TERM GOAL #1  ? Title independent with initial HEP   ? Status Achieved   ? ?  ?  ? ?  ? ? ? ? PT Long Term Goals - 12/26/21 1201   ? ?  ? PT LONG TERM GOAL #1  ? Title walk with FWW x 150 feet without rest with supervision only   ? Status On-going   ? ?  ?  ? ?  ? ? ? ? ? ? ? ? Plan - 01/02/22 1241   ? ? Clinical Impression Statement Patient with a lot on her mind today with her son being in the hospital, reports that she could not just sit at home, she reports taking the lasix as perscribed over the weekend and that the ankle and knee feel better and that she is not as SOB.  Really has a weak core ans struggles with the exercises wihtout support.   ? PT Next Visit Plan she sees the pulmonologist in 2 weeks   ? Consulted and Agree with Plan of Care Patient   ? ?  ?  ? ?  ? ? ?Patient will benefit from skilled therapeutic intervention in order  to improve the following deficits and impairments:  Abnormal gait, Decreased range of motion, Difficulty walking, Impaired UE functional use, Increased muscle spasms, Cardiopulmonary status limiting activity, Decreased endurance, Decreased activity tolerance, Pain, Decreased balance, Decreased mobility, Decreased strength ? ?Visit Diagnosis: ?Muscle weakness (generalized) ? ?Pain in left ankle and joints of left foot ? ?Difficulty in walking, not elsewhere classified ? ?Late effects of CVA (cerebrovascular accident) ? ?Left shoulder pain, unspecified chronicity ? ?Hemiplegia and hemiparesis following cerebral infarction affecting left non-dominant side ([place]) ? ?Ataxia ? ? ? ? ?Problem List ?Patient Active Problem List  ? Diagnosis Date Noted  ? Secondary hypercoagulable state ([place]) 03/16/2021  ? Dizziness 03/10/2021  ? Orthostatic hypotension 10/15/2020  ? Presbycusis of both ears 03/08/2020  ? Mixed stress and urge urinary incontinence 12/02/2019  ? Macrocytosis 12/01/2019  ? Nutritional anemia 12/01/2019  ? S/P total knee replacement 01/27/2019  ? Recurrent left knee instability 07/05/2018  ? Respiratory failure with hypoxia ([place]) 08/30/2017  ? Hypoxemia   ? Heart failure with preserved ejection fraction ([place]), Grade 3 diastolic dysfunction 40/81/4481  ? PAF (paroxysmal atrial fibrillation) ([place])   ? Dyspnea 03/19/2017  ? Encounter for preventive health examination 02/17/2016  ? Sensorineural hearing loss (SNHL), bilateral 01/26/2016  ? Morbid obesity ([place]) 06/17/2015  ? Hypomagnesemia 04/24/2014  ? Hemiparesis affecting left side as late effect of cerebrovascular accident ([place]) 04/24/2014  ? Nontraumatic cerebral hemorrhage ([place]) 04/30/2012  ? DM (diabetes mellitus) with complications ([place]) 85/63/1497  ? OSTEOPENIA 01/21/2009  ? UNSPECIFIED VITAMIN D DEFICIENCY 11/19/2007  ? HYPERCHOLESTEROLEMIA 10/25/2006  ? GASTROESOPHAGEAL REFLUX, NO ESOPHAGITIS 10/25/2006  ? DIVERTICULOSIS OF COLON 10/25/2006  ?  Osteoarthritis 10/25/2006  ? CERVICAL SPINE DISORDER, NOS 10/25/2006  ? ? Sumner Boast, PT ?01/02/2022, 12:45 PM ? ?[place] ?[address] ?[place], [place], [address] ?Phone: [phone]   Fax:  [phone] ? ?Name: Karen Jackson ?MRN: [id number] ?Date of Birth: 10/12/1940 ? ? ? ?

## 2022-01-05 ENCOUNTER — Ambulatory Visit: Payer: Medicare Other | Admitting: Physical Therapy

## 2022-01-09 ENCOUNTER — Ambulatory Visit: Payer: Medicare Other | Admitting: Physical Therapy

## 2022-01-10 ENCOUNTER — Encounter: Payer: Self-pay | Admitting: Pulmonary Disease

## 2022-01-10 ENCOUNTER — Ambulatory Visit (INDEPENDENT_AMBULATORY_CARE_PROVIDER_SITE_OTHER): Payer: Medicare Other | Admitting: Pulmonary Disease

## 2022-01-10 VITALS — BP 98/62 | HR 64 | Ht <= 58 in

## 2022-01-10 DIAGNOSIS — R0602 Shortness of breath: Secondary | ICD-10-CM | POA: Diagnosis not present

## 2022-01-10 MED ORDER — BUDESONIDE-FORMOTEROL FUMARATE 160-4.5 MCG/ACT IN AERO: 2.0000 | INHALATION_SPRAY | Freq: Two times a day (BID) | RESPIRATORY_TRACT | 12 refills | Status: DC

## 2022-01-10 NOTE — Progress Notes (Signed)
Synopsis: Referred in March 2023 for shortness of breath by Caron Presume, PA  Subjective:   PATIENT ID: Karen Jackson GENDER: female DOB: [birth year], MRN: [id number]  HPI  Chief Complaint  Patient presents with   Follow-up    F/U after PFT. States her breathing has slightly improved since last visit.    Karen Jackson is an 81 year old woman, former smoker with history of diastolic heart failure, hypertension, atrial fibrillation, and CVA who returns to pulmonary clinic for shortness of breath.   She has nocturnal hypoxemia based on ONO where she spent 8h3 and 69mn below 88% SpO2. She has started to use 2L of O2 at night.  PFTs were suggestive of restrictive defect. No TLC or DLCO completed.   She has tried Albuterol before her rehab session and may have noticed some improvement in her breathing.  Overall she reports progressive dyspnea and is very concerned about her functional decline.   OV 11/09/21 Patient reports increasing shortness of breath over the last year as her walk distance during physical therapy sessions has decreased.  She has been referred by her cardiology team for concern of pulmonary hypertension.  She had CT coronary scan 05/25/2021 which showed enlarged pulmonary artery.  Echocardiogram 06/03/2021 showed LVEF 50 to 55%.  Diastolic function unable to be evaluated.  RV systolic function and size are normal.  Mildly elevated pulmonary artery systolic pressure estimated at 42.8 mmHg.  Left atrium mildly dilated.  Right atrium mildly dilated.  Mild mitral valve regurgitation.  Mild to moderate tricuspid valve regurgitation.  Nuclear medicine myocardial perfusion scan 08/09/2021 showed a normal study with low risk.  Reports wheezing with exertion during her physical therapy sessions.  She does have cough with phlegm production on a daily basis.  She denies any sinus congestion or postnasal drainage.  She denies any seasonal allergies.  She denies any issues with  GERD.  She reports intermittent lower extremity edema bilaterally.  She is currently taking Lasix per her cardiology team 80 mg daily.  She denies any PND or orthopnea.  She is sleeping upright in a hospital bed at night.  She is unaware of any snoring or apneic events.  She is a former smoker.  She quit 40 years ago.  She smoked 0.25 to 0.50 packs/day for 26 to 27 years.  She has significant history for secondhand smoke in childhood.  She is a retired sEducation officer, museum  Her sister has COPD.  No lung cancer history in the family.  She is accompanied by her niece on today's visit.  Past Medical History:  Diagnosis Date   Acute cystitis without hematuria    Acute diastolic CHF (congestive heart failure) (HCC)    Arthritis    Dyspnea    Dysrhythmia    Fever of unknown origin 03/19/2017   Hyperlipidemia    Hypertension    denies at preop   Multifocal pneumonia    Neuromuscular disorder (HCC)    neuropathy left arm and foot   Osteopenia    Paralysis (HCC)    partial left side from CVA    Persistent atrial fibrillation (HCC)    PONV (postoperative nausea and vomiting)    Pre-diabetes    Stroke ([facility] 2013   hemmorahgic     Family History  Problem Relation Age of Onset   Diabetes Mother    Transient ischemic attack Father    Dementia Father      Social History   Socioeconomic History   Marital  status: Widowed    Spouse name: Not on file   Number of children: 2   Years of education: college   Highest education level: Not on file  Occupational History   Occupation: RetiredGames developer  Tobacco Use   Smoking status: Former    Packs/day: 0.50    Years: 23.00    Pack years: 11.50    Types: Cigarettes    Start date: 08/28/1957    Quit date: 03/09/1981    Years since quitting: 40.8   Smokeless tobacco: Never  Vaping Use   Vaping Use: Never used  Substance and Sexual Activity   Alcohol use: Yes    Alcohol/week: 16.0 standard drinks    Types: 2 Glasses of wine, 14 Standard  drinks or equivalent per week    Comment: daily one glass   Drug use: No   Sexual activity: Not Currently  Other Topics Concern   Not on file  Social History Narrative   ** Merged History Encounter ** Health Care POA: son, Charly RayEmergency Contact: Ann and Roosvelt Harps, niece & nephew (c) [phone] (h) [phone] of Life Plan: Who lives with you: selfAny pets: noneDiet: Pt has a varied diet of protein, vegetables and limits "white" foods.Exercise: Pt does water aerobics 3x week, golf 2x weekSeatbelts: Pt reports wearing seatbelt when in vehicle. Sun Exposure/Protection: Pt reports wearing sun screenHobbies: golfing, swimming    Lives alone (has caregivers coming in)   R handed    Social Determinants of Health   Financial Resource Strain: Not on file  Food Insecurity: Not on file  Transportation Needs: Not on file  Physical Activity: Not on file  Stress: Not on file  Social Connections: Not on file  Intimate Partner Violence: Not on file     Allergies  Allergen Reactions   Codeine Phosphate Nausea And Vomiting   Simvastatin Other (See Comments)    Myalgia  Changed brand ok now     Outpatient Medications Prior to Visit  Medication Sig Dispense Refill   albuterol (VENTOLIN HFA) 108 (90 Base) MCG/ACT inhaler Inhale 2 puffs into the lungs every 6 (six) hours as needed for wheezing or shortness of breath. 18 g 2   apixaban (ELIQUIS) 5 MG TABS tablet Take 1 tablet (5 mg total) by mouth 2 (two) times daily. 60 tablet 11   atorvastatin (LIPITOR) 40 MG tablet Take 1 tablet (40 mg total) by mouth daily. 90 tablet 3   Calcium Carb-Cholecalciferol (CALCIUM CARBONATE-VITAMIN D3) 600-400 MG-UNIT TABS Take 1 tablet by mouth daily.     celecoxib (CELEBREX) 200 MG capsule TAKE 1 CAPSULE BY MOUTH DAILY AS NEEDED FOR ARTHRITIS OR PAIN 90 capsule 3   diclofenac Sodium (VOLTAREN) 1 % GEL APPLY 2 GRAMS EXTERNALLY TO THE AFFECTED AREA FOUR TIMES DAILY     ergocalciferol (VITAMIN D2) 1.25 MG (50000 UT)  capsule 50,000 unit     furosemide (LASIX) 80 MG tablet TAKE 1 TABLET BY MOUTH  DAILY 90 tablet 3   gabapentin (NEURONTIN) 100 MG capsule TAKE 1 CAPSULE BY MOUTH 3  TIMES DAILY 270 capsule 3   hydrocortisone 2.5 % cream Apply topically 2 (two) times daily as needed.     lidocaine (LIDODERM) 5 % Place 1 patch onto the skin daily. Remove & Discard patch within 12 hours or as directed by MD 30 patch 2   MAGNESIUM-OXIDE 400 (240 Mg) MG tablet TAKE 1 TABLET BY MOUTH DAILY 90 tablet 3   metoprolol succinate (TOPROL-XL) 50 MG 24 hr tablet  Take 1 tablet (50 mg total) by mouth daily. Take with or immediately following a meal. 90 tablet 3   omeprazole (PRILOSEC) 20 MG capsule TAKE 1 CAPSULE BY MOUTH  DAILY 90 capsule 3   Semaglutide, 1 MG/DOSE, (OZEMPIC, 1 MG/DOSE,) 4 MG/3ML SOPN Inject 1 mg into the skin once a week.     traZODone (DESYREL) 50 MG tablet TAKE 1 TABLET BY MOUTH AT  BEDTIME 90 tablet 3   TURMERIC PO Take 1 capsule by mouth daily.     No facility-administered medications prior to visit.   Review of Systems  Constitutional:  Negative for chills, fever, malaise/fatigue and weight loss.  HENT:  Negative for congestion, sinus pain and sore throat.   Eyes: Negative.   Respiratory:  Positive for cough, sputum production, shortness of breath and wheezing. Negative for hemoptysis.   Cardiovascular:  Positive for leg swelling. Negative for chest pain, palpitations, orthopnea and claudication.  Gastrointestinal:  Negative for abdominal pain, heartburn, nausea and vomiting.  Genitourinary: Negative.   Musculoskeletal:  Positive for joint pain. Negative for myalgias.  Skin:  Negative for rash.  Neurological:  Negative for weakness.  Endo/Heme/Allergies: Negative.   Psychiatric/Behavioral: Negative.     Objective:   Vitals:   01/10/22 1042  BP: 98/62  Pulse: 64  SpO2: 94%  Height: '4\' 10"'$  (1.473 m)   Physical Exam Constitutional:      General: She is not in acute distress.     Appearance: She is not ill-appearing.  HENT:     Head: Normocephalic and atraumatic.  Eyes:     General: No scleral icterus.    Conjunctiva/sclera: Conjunctivae normal.  Cardiovascular:     Rate and Rhythm: Normal rate and regular rhythm.     Pulses: Normal pulses.     Heart sounds: Normal heart sounds. No murmur heard. Pulmonary:     Effort: Pulmonary effort is normal.     Breath sounds: Rales (bibasilar, mild) present. No wheezing or rhonchi.  Musculoskeletal:     Right lower leg: No edema.     Left lower leg: No edema.  Skin:    General: Skin is warm and dry.  Neurological:     General: No focal deficit present.     Mental Status: She is alert.  Psychiatric:        Mood and Affect: Mood normal.        Behavior: Behavior normal.        Thought Content: Thought content normal.        Judgment: Judgment normal.   CBC    Component Value Date/Time   WBC 7.5 07/26/2021 1648   WBC 6.5 03/10/2021 1244   RBC 4.03 07/26/2021 1648   RBC 4.48 03/10/2021 1244   HGB 13.4 07/26/2021 1648   HCT 38.8 07/26/2021 1648   PLT 308 07/26/2021 1648   MCV 96 07/26/2021 1648   MCH 33.3 (H) 07/26/2021 1648   MCH 32.1 03/10/2021 1244   MCHC 34.5 07/26/2021 1648   MCHC 33.7 03/10/2021 1244   RDW 12.2 07/26/2021 1648   LYMPHSABS 1.2 03/10/2021 1244   MONOABS 0.6 03/10/2021 1244   EOSABS 0.1 03/10/2021 1244   BASOSABS 0.0 03/10/2021 1244      Latest Ref Rng & Units 10/27/2021   11:06 AM 07/26/2021    4:48 PM 05/17/2021   11:07 AM  BMP  Glucose 70 - 99 mg/dL 130   125   151    BUN 8 - 27 mg/dL 15  21   16    Creatinine 0.57 - 1.00 mg/dL 0.57   0.61   0.59    BUN/Creat Ratio 12 - 28 26   34   27    Sodium 134 - 144 mmol/L 135   140   140    Potassium 3.5 - 5.2 mmol/L 4.4   3.6   4.4    Chloride 96 - 106 mmol/L 93   92   94    CO2 20 - 29 mmol/L '28   31   31    '$ Calcium 8.7 - 10.3 mg/dL 9.5   9.2   9.6     Chest imaging: CXR 08/16/21 Remote left rib fractures. Bilateral glenohumeral  joint osteoarthritis. Midline trachea. Mild cardiomegaly. Atherosclerosis in the transverse aorta. Prominent pulmonary arteries, especially on the right. No pleural effusion or pneumothorax. No congestive failure. Mild scarring at the left lung base laterally.  CT Coronary Scan 05/25/21 Dilatation of the pulmonic trunk (3.5 cm in diameter), concerning for pulmonary arterial hypertension. Scattered linear areas of architectural distortion are noted in the visualized lung bases, likely chronic post infectious or inflammatory scarring. Within the visualized portions of the thorax there are no suspicious appearing pulmonary nodules or masses, there is no acute consolidative airspace disease, no pleural effusions, no pneumothorax and no lymphadenopathy.  PFT:    Latest Ref Rng & Units 12/13/2021    4:06 PM  PFT Results  FVC-Pre L 1.07    FVC-Predicted Pre % 57    FVC-Post L 1.05    FVC-Predicted Post % 56    Pre FEV1/FVC % % 80    Post FEV1/FCV % % 83    FEV1-Pre L 0.86    FEV1-Predicted Pre % 62    FEV1-Post L 0.87     Labs:  Path:  Echo 2022: LV EF 50-55%. RV systolic function and size are normal. Mildly elevated PASP. LA mildly dilated. RA mildly dilated. Tricuspid regurgitation is mild to moderate. Mild mitral valve regurgitation.  Heart Catheterization:  Assessment & Plan:   Shortness of breath - Plan: CT CHEST HIGH RESOLUTION, budesonide-formoterol (SYMBICORT) 160-4.5 MCG/ACT inhaler  Discussion: Karen Jackson is an 81 year old woman, former smoker with history of diastolic heart failure, hypertension, atrial fibrillation, and CVA who returns to pulmonary clinic for shortness of breath.   The etiology of her shortness of breath is likely multifactorial which includes chronic diastolic heart failure with atrial fibrillation along with possible pulmonary hypertension, nocturnal hypoxemia and possible restrictive lung disease.  We will trial her on symbicort 160-4.33mg 2  puffs twice daily along with as needed albuterol for her symptoms.   She is to continue 2L O2 at night for nocturnal hypoxemia.  I have messaged her cardiology team and they will plan to setup a right and left heart cath.   Follow-up in 2 months.  JFreda Jackson MD [facility] Care Office: [phone]  Current Outpatient Medications:    albuterol (VENTOLIN HFA) 108 (90 Base) MCG/ACT inhaler, Inhale 2 puffs into the lungs every 6 (six) hours as needed for wheezing or shortness of breath., Disp: 18 g, Rfl: 2   apixaban (ELIQUIS) 5 MG TABS tablet, Take 1 tablet (5 mg total) by mouth 2 (two) times daily., Disp: 60 tablet, Rfl: 11   atorvastatin (LIPITOR) 40 MG tablet, Take 1 tablet (40 mg total) by mouth daily., Disp: 90 tablet, Rfl: 3   budesonide-formoterol (SYMBICORT) 160-4.5 MCG/ACT inhaler, Inhale 2 puffs into the  lungs 2 (two) times daily., Disp: 1 each, Rfl: 12   Calcium Carb-Cholecalciferol (CALCIUM CARBONATE-VITAMIN D3) 600-400 MG-UNIT TABS, Take 1 tablet by mouth daily., Disp: , Rfl:    celecoxib (CELEBREX) 200 MG capsule, TAKE 1 CAPSULE BY MOUTH DAILY AS NEEDED FOR ARTHRITIS OR PAIN, Disp: 90 capsule, Rfl: 3   diclofenac Sodium (VOLTAREN) 1 % GEL, APPLY 2 GRAMS EXTERNALLY TO THE AFFECTED AREA FOUR TIMES DAILY, Disp: , Rfl:    ergocalciferol (VITAMIN D2) 1.25 MG (50000 UT) capsule, 50,000 unit, Disp: , Rfl:    furosemide (LASIX) 80 MG tablet, TAKE 1 TABLET BY MOUTH  DAILY, Disp: 90 tablet, Rfl: 3   gabapentin (NEURONTIN) 100 MG capsule, TAKE 1 CAPSULE BY MOUTH 3  TIMES DAILY, Disp: 270 capsule, Rfl: 3   hydrocortisone 2.5 % cream, Apply topically 2 (two) times daily as needed., Disp: , Rfl:    lidocaine (LIDODERM) 5 %, Place 1 patch onto the skin daily. Remove & Discard patch within 12 hours or as directed by MD, Disp: 30 patch, Rfl: 2   MAGNESIUM-OXIDE 400 (240 Mg) MG tablet, TAKE 1 TABLET BY MOUTH DAILY, Disp: 90 tablet, Rfl: 3   metoprolol succinate  (TOPROL-XL) 50 MG 24 hr tablet, Take 1 tablet (50 mg total) by mouth daily. Take with or immediately following a meal., Disp: 90 tablet, Rfl: 3   omeprazole (PRILOSEC) 20 MG capsule, TAKE 1 CAPSULE BY MOUTH  DAILY, Disp: 90 capsule, Rfl: 3   Semaglutide, 1 MG/DOSE, (OZEMPIC, 1 MG/DOSE,) 4 MG/3ML SOPN, Inject 1 mg into the skin once a week., Disp: , Rfl:    traZODone (DESYREL) 50 MG tablet, TAKE 1 TABLET BY MOUTH AT  BEDTIME, Disp: 90 tablet, Rfl: 3   TURMERIC PO, Take 1 capsule by mouth daily., Disp: , Rfl:

## 2022-01-10 NOTE — Patient Instructions (Addendum)
We will check a high resolution CT Chest scan to further evaluate your shortness of breath ? ?We will complete the pulmonary function tests.  ? ?Start symbicort inhaler 2 puffs twice daily ?- rinse mouth out after each use ? ?Continue to use albuterol inhaler as needed ? ?I have messaged your cardiology team for further consideration of completing right heart cath for evaluation of pulmonary hypertension. ? ?Follow up in months ? ? ?

## 2022-01-11 IMAGING — CR DG CHEST 2V
2 series · 2 of 2 positions shown · non-contrast
Comparison: 03/10/2021

CLINICAL DATA: Shortness of breath

EXAM:
CHEST - 2 VIEW

[w chest lat]
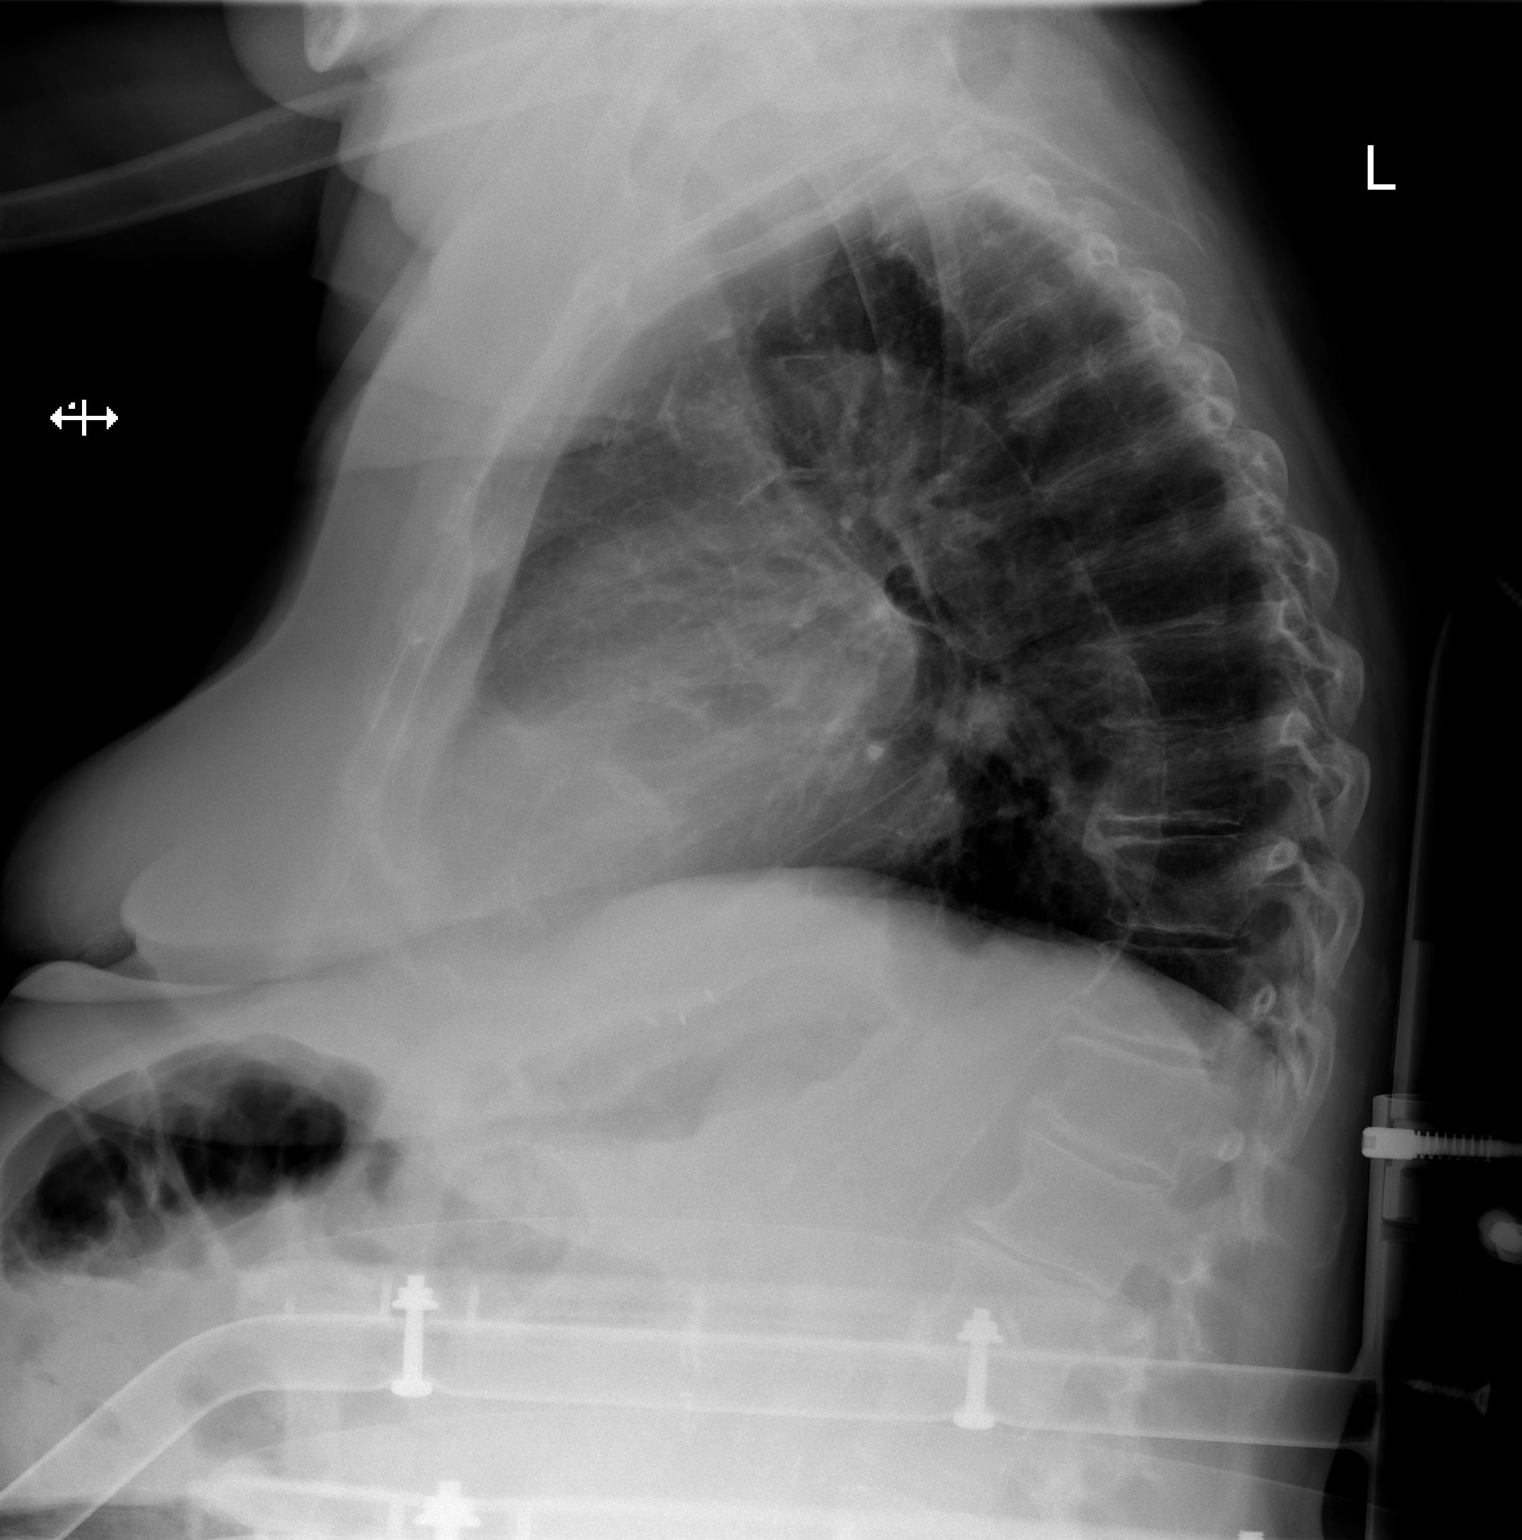

[x chest ap]
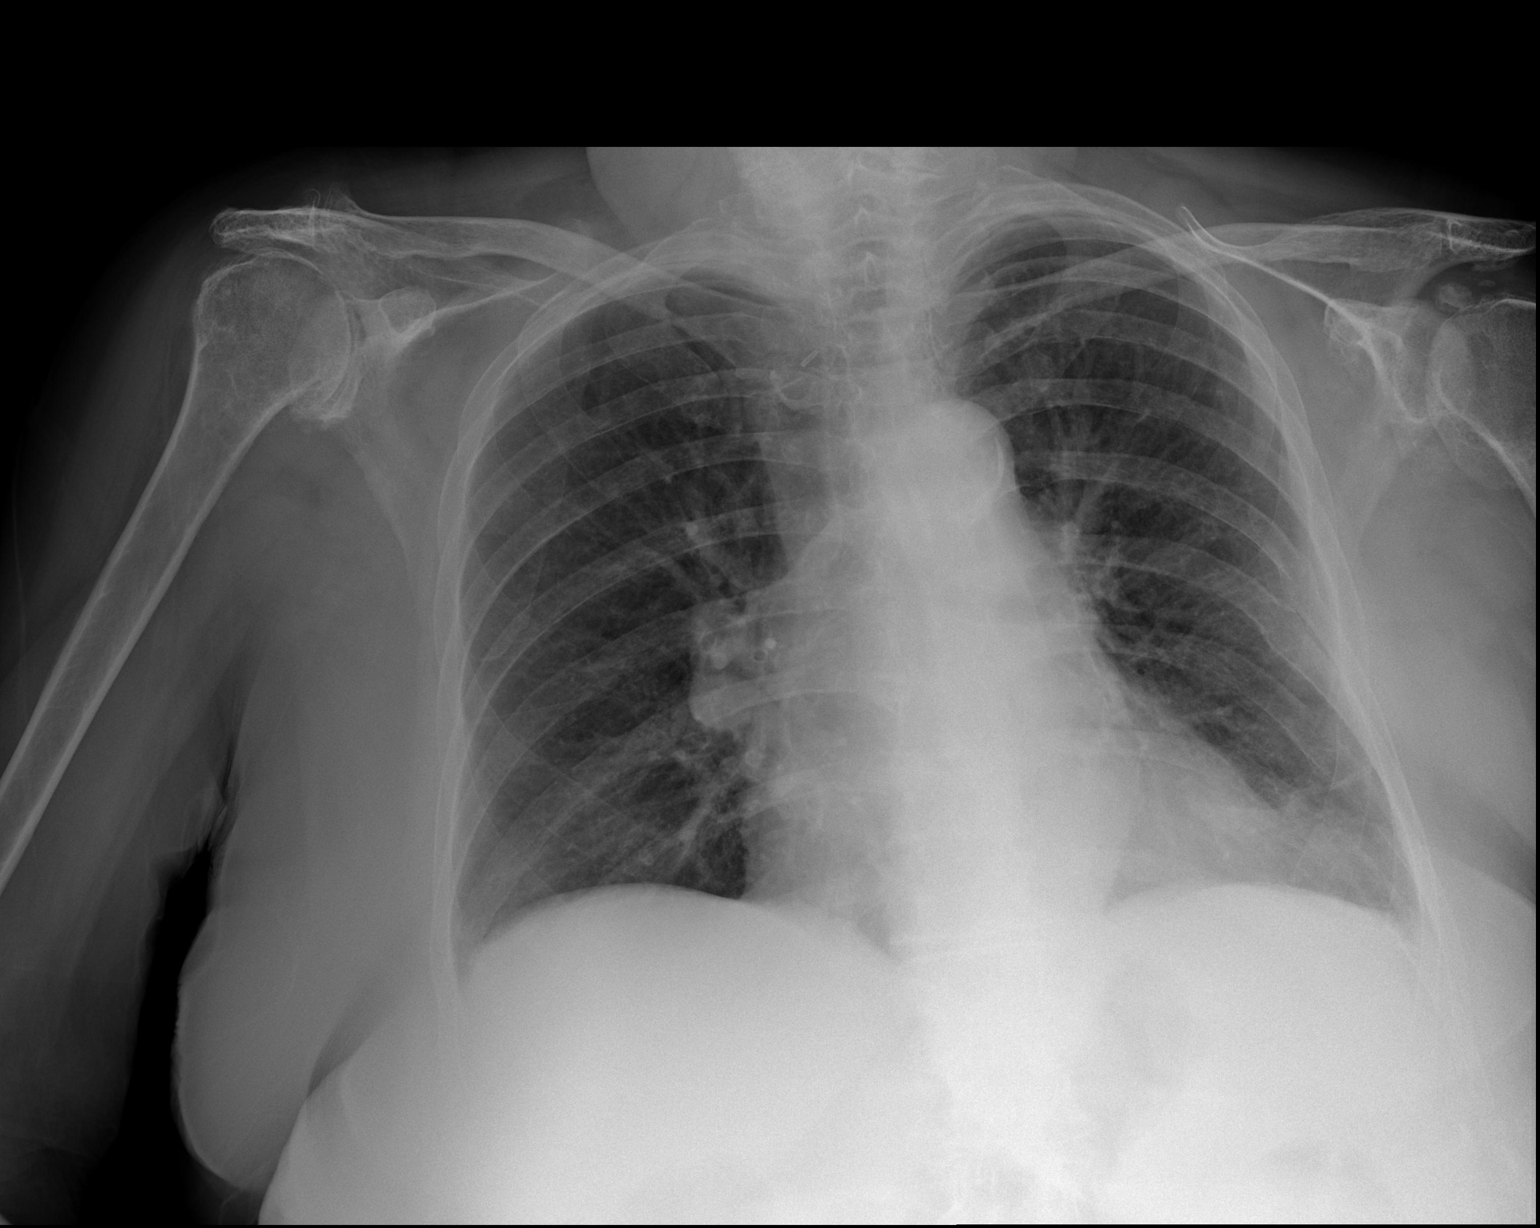

[2 of 2 positions shown; findings below may reference images not displayed]

FINDINGS: Remote left rib fractures. Bilateral glenohumeral joint
osteoarthritis. Midline trachea. Mild cardiomegaly. Atherosclerosis
in the transverse aorta. Prominent pulmonary arteries, especially on
the right. No pleural effusion or pneumothorax. No congestive
failure. Mild scarring at the left lung base laterally.
IMPRESSION: No acute cardiopulmonary disease.

Cardiomegaly without congestive failure.

Pulmonary artery enlargement suggests pulmonary arterial
hypertension.

## 2022-01-12 ENCOUNTER — Ambulatory Visit: Payer: Medicare Other | Admitting: Physical Therapy

## 2022-01-15 ENCOUNTER — Encounter: Payer: Self-pay | Admitting: Pulmonary Disease

## 2022-01-16 ENCOUNTER — Ambulatory Visit: Payer: Medicare Other | Admitting: Physical Therapy

## 2022-01-16 ENCOUNTER — Encounter: Payer: Self-pay | Admitting: Family Medicine

## 2022-01-16 ENCOUNTER — Ambulatory Visit (INDEPENDENT_AMBULATORY_CARE_PROVIDER_SITE_OTHER): Payer: Medicare Other | Admitting: Family Medicine

## 2022-01-16 VITALS — BP 122/60 | HR 84 | Ht <= 58 in | Wt 151.2 lb

## 2022-01-16 DIAGNOSIS — F4321 Adjustment disorder with depressed mood: Secondary | ICD-10-CM | POA: Diagnosis not present

## 2022-01-16 DIAGNOSIS — R0609 Other forms of dyspnea: Secondary | ICD-10-CM | POA: Diagnosis not present

## 2022-01-16 DIAGNOSIS — Z0000 Encounter for general adult medical examination without abnormal findings: Secondary | ICD-10-CM

## 2022-01-16 DIAGNOSIS — I480 Paroxysmal atrial fibrillation: Secondary | ICD-10-CM | POA: Diagnosis not present

## 2022-01-16 DIAGNOSIS — Z7189 Other specified counseling: Secondary | ICD-10-CM | POA: Diagnosis not present

## 2022-01-16 DIAGNOSIS — I69354 Hemiplegia and hemiparesis following cerebral infarction affecting left non-dominant side: Secondary | ICD-10-CM | POA: Diagnosis not present

## 2022-01-16 DIAGNOSIS — I5032 Chronic diastolic (congestive) heart failure: Secondary | ICD-10-CM

## 2022-01-16 DIAGNOSIS — E118 Type 2 diabetes mellitus with unspecified complications: Secondary | ICD-10-CM | POA: Diagnosis not present

## 2022-01-16 LAB — POCT GLYCOSYLATED HEMOGLOBIN (HGB A1C): HbA1c, POC (controlled diabetic range): 6.3 % (ref 0.0–7.0)

## 2022-01-16 NOTE — Patient Instructions (Addendum)
I am so sorry about Charlie.  The service was beautiful.  My cell phone number is [phone] if I can help with anything. I will call with your A1C result.  I am certain it will be fine. From my standpoint, you are in good shape.  Stay on your same medications.  You are due for: A tetanus booster.  Crazy insurance most don't cover unless you have a cut booster.   By my records you are due for a Covid booster.  You need the bivalent booster which came out in the fall of 2022. That immunization would be my top priority recommendation You are also due for the new vaccine.  Of all the vaccines we give, this is the one most likely to cause side effects.  Wait until a time you could afford to be sick for 3 days.  It is a series of two shots with the second being 2-6 months after the first.  It is a shot given at the pharm.

## 2022-01-17 ENCOUNTER — Telehealth: Payer: Self-pay

## 2022-01-17 ENCOUNTER — Ambulatory Visit: Payer: Medicare Other | Admitting: Physical Therapy

## 2022-01-17 ENCOUNTER — Other Ambulatory Visit (HOSPITAL_COMMUNITY): Payer: Self-pay

## 2022-01-17 ENCOUNTER — Encounter: Payer: Self-pay | Admitting: Family Medicine

## 2022-01-17 DIAGNOSIS — F4321 Adjustment disorder with depressed mood: Secondary | ICD-10-CM | POA: Insufficient documentation

## 2022-01-17 DIAGNOSIS — Z7189 Other specified counseling: Secondary | ICD-10-CM | POA: Insufficient documentation

## 2022-01-17 NOTE — Assessment & Plan Note (Signed)
Given age and grief, preventive care is mostly a back burner issue.  See AVS for my recommendations.

## 2022-01-17 NOTE — Telephone Encounter (Signed)
Spoke to pt regarding possible pt assistance for eliquis.   Informed pt since she has Part D coverage, [place] will want her to have spent 3% of her annual income on out of pocket medication costs. Let pt know that if my math is correct, she would need to have spent at least $700 this year. Pt says she will get her printout from [facility] and get back with me.

## 2022-01-17 NOTE — Assessment & Plan Note (Signed)
At goal on current therapy.

## 2022-01-17 NOTE — Assessment & Plan Note (Signed)
So many losses and she remains upbeat.  She has great support from her extended family and she is a strong woman.

## 2022-01-17 NOTE — Progress Notes (Signed)
    SUBJECTIVE:   CHIEF COMPLAINT / HPI:   Annual exam: Caveat: Son died unexpectedly ~10 days ago.  Remer Macho was yesterday.  Ms Sabree has now lost her husband and both of her sons.  So sad.  "I am numb."   Issues: Hx of CVA with residual hemiparesis.  Has caregiver 16 hours a day.  Has purwick cath at night so is not getting up.  Lives alone.  Family lives just down the street and are very supportive.   Goals of care.  She wants to be DNR.  She still has quality of life.  And she is clear that she does not want resuscitation.  Als, her son who just died was her healthcare POA.   DOE.  Used to be able to want 150 feet with walker.  No can only get 50 feet on best day.  SOB but pulse ox remains OK (working with PT.)  This is being worked up by [employer] and cards, who feel likely multifactorial. Hx of HFpEF.  DOE and leg swelling are her prominent symptoms.  Leg swelling is much better and has lost 15-20 lbs with taking lasix regularly. Diabetes.  At goal and losing weight.  Good. A fib.  On eliquis.  No complaints. Wt loss.  Likely a combo of diuresis, ozempic and decreased appetite from grief.  Recent labs reassuring. HPDP.  Not much at this age.  We discussed immunizations and priorities.  She believes she has a covid booster that we have not documented.    PERTINENT  PMH / PSH: Denies CP, bleeding, concerning skin changes.  No change in bowel or bladder.  OBJECTIVE:   BP 122/60 (BP Location: Right Arm, Cuff Size: Normal)   Pulse 84   Ht '4\' 10"'$  (1.473 m)   Wt 151 lb 3.2 oz (68.6 kg)   SpO2 99%   BMI 31.60 kg/m   Neck supple without mass Lungs clear Cardiac RRR without m or g Abd benign Ext trace edema bilaterally. Neuro, left hemiparesis.  Otherwise, motor, sensory, affect and cognition grossly normal.    ASSESSMENT/PLAN:   Encounter for preventive health examination Given age and grief, preventive care is mostly a back burner issue.  See AVS for my recommendations.  Hemiparesis  affecting left side as late effect of cerebrovascular accident ([place]) Stable.  Doing well with physical therapy.    DM (diabetes mellitus) with complications ([place]) At goal on current therapy.    Dyspnea WU by Pulm and cards.  No change in meds by me.  Goals of care, counseling/discussion Given new HCPA papers for her to fill out - needs new POA after son's death.  Also signed [facility] out of facility DNR.    Grief So many losses and she remains upbeat.  She has great support from her extended family and she is a strong woman.     Zenia Resides, MD [place]

## 2022-01-17 NOTE — Assessment & Plan Note (Signed)
Stable.  Doing well with physical therapy.

## 2022-01-17 NOTE — Assessment & Plan Note (Signed)
WU by Pulm and cards.  No change in meds by me.

## 2022-01-17 NOTE — Assessment & Plan Note (Signed)
Given new [place] papers for her to fill out - needs new POA after son's death.  Also signed canary yellow out of facility DNR.

## 2022-01-18 ENCOUNTER — Ambulatory Visit (HOSPITAL_COMMUNITY): Payer: Medicare Other

## 2022-01-19 ENCOUNTER — Ambulatory Visit: Payer: Medicare Other | Admitting: Physical Therapy

## 2022-01-19 ENCOUNTER — Encounter: Payer: Self-pay | Admitting: Physical Therapy

## 2022-01-19 DIAGNOSIS — M6281 Muscle weakness (generalized): Secondary | ICD-10-CM | POA: Diagnosis not present

## 2022-01-19 DIAGNOSIS — R270 Ataxia, unspecified: Secondary | ICD-10-CM

## 2022-01-19 DIAGNOSIS — R262 Difficulty in walking, not elsewhere classified: Secondary | ICD-10-CM

## 2022-01-19 DIAGNOSIS — M25572 Pain in left ankle and joints of left foot: Secondary | ICD-10-CM

## 2022-01-19 DIAGNOSIS — M25512 Pain in left shoulder: Secondary | ICD-10-CM

## 2022-01-19 DIAGNOSIS — I6990 Unspecified sequelae of unspecified cerebrovascular disease: Secondary | ICD-10-CM | POA: Diagnosis not present

## 2022-01-19 DIAGNOSIS — I69354 Hemiplegia and hemiparesis following cerebral infarction affecting left non-dominant side: Secondary | ICD-10-CM | POA: Diagnosis not present

## 2022-01-19 NOTE — Therapy (Signed)
[place]. [place], [place], [address] Phone: [phone]   Fax:  [phone]  Physical Therapy Treatment  Patient Details  Name: Karen Jackson MRN: [id number] Date of Birth: 1941/04/11 Referring Provider (PT): Hensel   Encounter Date: 01/19/2022   PT End of Session - 01/19/22 1057     Visit Number 110    Date for PT Re-Evaluation 02/19/22    Authorization Type Medicare    PT Start Time 1054    PT Stop Time 1143    PT Time Calculation (min) 49 min    Activity Tolerance Patient limited by fatigue    Behavior During Therapy [facility] for tasks assessed/performed             Past Medical History:  Diagnosis Date   Acute cystitis without hematuria    Acute diastolic CHF (congestive heart failure) (HCC)    Arthritis    Dyspnea    Dysrhythmia    Fever of unknown origin 03/19/2017   Hyperlipidemia    Hypertension    denies at preop   Multifocal pneumonia    Neuromuscular disorder ([place])    neuropathy left arm and foot   Osteopenia    Paralysis ([place])    partial left side from CVA    Persistent atrial fibrillation (HCC)    PONV (postoperative nausea and vomiting)    Pre-diabetes    Stroke [facility]) 2013   hemmorahgic    Past Surgical History:  Procedure Laterality Date   ANKLE SURGERY     APPENDECTOMY     CHOLECYSTECTOMY     HERNIA REPAIR     Esophagus   JOINT REPLACEMENT     total- right partial- left   MASTECTOMY PARTIAL / LUMPECTOMY  2012   left   ORIF ANKLE FRACTURE Left 07/20/2018   Procedure: OPEN REDUCTION INTERNAL FIXATION (ORIF) ANKLE FRACTURE;  Surgeon: Wylene Simmer, MD;  Location: [place];  Service: Orthopedics;  Laterality: Left;   TOTAL KNEE ARTHROPLASTY Left 01/27/2019   Procedure: TOTAL KNEE ARTHROPLASTY;  Surgeon: Vickey Huger, MD;  [facility];  Service: Orthopedics;  Laterality: Left;    There were no vitals filed for this visit.   Subjective Assessment - 01/19/22 1058      Subjective Patient had her son pass away a few weeks ago and has not been in, she reports that she has struggled with being active. reports no changes with her breathing    Currently in Pain? No/denies                [facility] PT Assessment - 01/19/22 0001       Timed Up and Go Test   Normal TUG (seconds) 45                           [facility] Adult PT Treatment/Exercise - 01/19/22 0001       Ambulation/Gait   Gait Comments 3x 50' with FWW and CGA with chair following behind      High Level Balance   High Level Balance Comments weight shifts side to side front to back.  had her do some marching in place and trying to bear weight on the left side LE      Knee/Hip Exercises: Standing   Hip Flexion Both;2 sets;10 reps    Hip Flexion Limitations in Pbars    Hip Abduction 2 sets;10 reps;Both    Abduction Limitations in pbars  Knee/Hip Exercises: Seated   Long Arc Quad Strengthening;10 reps;3 sets    [employer] Weight 5 lbs.    Hamstring Curl Strengthening;Both;2 sets;15 reps    Hamstring Limitations green                       PT Short Term Goals - 11/01/21 1056       PT SHORT TERM GOAL #1   Title independent with initial HEP    Status Achieved               PT Long Term Goals - 01/19/22 1152       PT LONG TERM GOAL #1   Title walk with FWW x 150 feet without rest with supervision only    Status On-going      PT LONG TERM GOAL #2   Title increase left knee strength to 4/5    Status Partially Met      PT LONG TERM GOAL #3   Title decrease TUG to 33 seconds    Status On-going      PT LONG TERM GOAL #4   Title transfer independently with set up    Status Partially Met      PT LONG TERM GOAL #5   Title report pain in the shoulder and ankle 50% less    Status Partially Met                   Plan - 01/19/22 1157     Clinical Impression Statement Patients son unexpectedly passed away about 2 weeks ago, she has  not been in since that time, She has had a pulmonology appointment that she reports that she will have to return to see them.  She is on Lasix and is doing albuterol treatments for breathing, I still feel that over the past month she has regressed to where to day she could not go more than 50 feet with FWW and CGA.  I do feel that the stress of her son and all of the planning has had an adverse affect.  She has improved some in her strength,, decreased pain and some in the TUG.  I feel that we should continue to try to get her back to a PLOF of able to walk at least the 100 feet with supervision    PT Next Visit Plan will submit renewal and work on progressing her back to PLOF    Consulted and Agree with Plan of Care Patient             Patient will benefit from skilled therapeutic intervention in order to improve the following deficits and impairments:  Abnormal gait, Decreased range of motion, Difficulty walking, Impaired UE functional use, Increased muscle spasms, Cardiopulmonary status limiting activity, Decreased endurance, Decreased activity tolerance, Pain, Decreased balance, Decreased mobility, Decreased strength  Visit Diagnosis: Muscle weakness (generalized)  Pain in left ankle and joints of left foot  Difficulty in walking, not elsewhere classified  Late effects of CVA (cerebrovascular accident)  Left shoulder pain, unspecified chronicity  Hemiplegia and hemiparesis following cerebral infarction affecting left non-dominant side [facility])  Ataxia     Problem List Patient Active Problem List   Diagnosis Date Noted   Goals of care, counseling/discussion 01/17/2022   Grief 01/17/2022   Secondary hypercoagulable state ([place]) 03/16/2021   Dizziness 03/10/2021   Orthostatic hypotension 10/15/2020   Presbycusis of both ears 03/08/2020   Mixed stress and urge urinary incontinence 12/02/2019  Macrocytosis 12/01/2019   Nutritional anemia 12/01/2019   S/P total knee replacement  01/27/2019   Recurrent left knee instability 07/05/2018   Respiratory failure with hypoxia (HCC) 08/30/2017   Hypoxemia    Heart failure with preserved ejection fraction (HCC), Grade 3 diastolic dysfunction 14/43/1540   PAF (paroxysmal atrial fibrillation) ([place])    Dyspnea 03/19/2017   Encounter for preventive health examination 02/17/2016   Sensorineural hearing loss (SNHL), bilateral 01/26/2016   Hypomagnesemia 04/24/2014   Hemiparesis affecting left side as late effect of cerebrovascular accident ([place]) 04/24/2014   Nontraumatic cerebral hemorrhage ([place]) 04/30/2012   DM (diabetes mellitus) with complications ([place]) 08/67/6195   OSTEOPENIA 01/21/2009   UNSPECIFIED VITAMIN D DEFICIENCY 11/19/2007   HYPERCHOLESTEROLEMIA 10/25/2006   GASTROESOPHAGEAL REFLUX, NO ESOPHAGITIS 10/25/2006   DIVERTICULOSIS OF COLON 10/25/2006   Osteoarthritis 10/25/2006   CERVICAL SPINE DISORDER, NOS 10/25/2006    Sumner Boast, PT 01/19/2022, 12:02 PM  [place]. [place], [place], [address] Phone: [phone]   Fax:  [phone]  Name: Karen Jackson MRN: [id number] Date of Birth: 1940/10/17

## 2022-01-24 ENCOUNTER — Ambulatory Visit: Payer: Medicare Other | Admitting: Physical Therapy

## 2022-01-24 ENCOUNTER — Encounter: Payer: Self-pay | Admitting: Physical Therapy

## 2022-01-24 DIAGNOSIS — I69354 Hemiplegia and hemiparesis following cerebral infarction affecting left non-dominant side: Secondary | ICD-10-CM

## 2022-01-24 DIAGNOSIS — I6990 Unspecified sequelae of unspecified cerebrovascular disease: Secondary | ICD-10-CM | POA: Diagnosis not present

## 2022-01-24 DIAGNOSIS — M25512 Pain in left shoulder: Secondary | ICD-10-CM

## 2022-01-24 DIAGNOSIS — M6281 Muscle weakness (generalized): Secondary | ICD-10-CM

## 2022-01-24 DIAGNOSIS — M25572 Pain in left ankle and joints of left foot: Secondary | ICD-10-CM | POA: Diagnosis not present

## 2022-01-24 DIAGNOSIS — R270 Ataxia, unspecified: Secondary | ICD-10-CM

## 2022-01-24 DIAGNOSIS — R262 Difficulty in walking, not elsewhere classified: Secondary | ICD-10-CM | POA: Diagnosis not present

## 2022-01-24 NOTE — Therapy (Signed)
[place]. [place], [place], [address] Phone: [phone]   Fax:  [phone]  Physical Therapy Treatment  Patient Details  Name: Karen Jackson MRN: [id number] Date of Birth: 1941/08/28 Referring Provider (PT): Hensel   Encounter Date: 01/24/2022   PT End of Session - 01/24/22 1057     Visit Number 37    Date for PT Re-Evaluation 02/19/22    Authorization Type Medicare    PT Start Time 1052    PT Stop Time 1142    PT Time Calculation (min) 50 min    Activity Tolerance Patient limited by fatigue    Behavior During Therapy [facility] for tasks assessed/performed             Past Medical History:  Diagnosis Date   Acute cystitis without hematuria    Acute diastolic CHF (congestive heart failure) (HCC)    Arthritis    Dyspnea    Dysrhythmia    Fever of unknown origin 03/19/2017   Hyperlipidemia    Hypertension    denies at preop   Multifocal pneumonia    Neuromuscular disorder ([place])    neuropathy left arm and foot   Osteopenia    Paralysis ([place])    partial left side from CVA    Persistent atrial fibrillation (HCC)    PONV (postoperative nausea and vomiting)    Pre-diabetes    Stroke [facility]) 2013   hemmorahgic    Past Surgical History:  Procedure Laterality Date   ANKLE SURGERY     APPENDECTOMY     CHOLECYSTECTOMY     HERNIA REPAIR     Esophagus   JOINT REPLACEMENT     total- right partial- left   MASTECTOMY PARTIAL / LUMPECTOMY  2012   left   ORIF ANKLE FRACTURE Left 07/20/2018   Procedure: OPEN REDUCTION INTERNAL FIXATION (ORIF) ANKLE FRACTURE;  Surgeon: Wylene Simmer, MD;  Location: [place];  Service: Orthopedics;  Laterality: Left;   TOTAL KNEE ARTHROPLASTY Left 01/27/2019   Procedure: TOTAL KNEE ARTHROPLASTY;  Surgeon: Vickey Huger, MD;  [facility];  Service: Orthopedics;  Laterality: Left;    There were no vitals filed for this visit.   Subjective Assessment - 01/24/22 1058      Subjective Patient reports that with all the wet weather all of her joints are really hurting. reports that the left ankle is rolling and hurting ,more    Currently in Pain? Yes    Pain Score 8     Pain Location Ankle    Aggravating Factors  wet weather                               [facility] Adult PT Treatment/Exercise - 01/24/22 0001       Ambulation/Gait   Gait Comments 1x35', 3x 50 feet first attempt was limited due to ankle pain.      Knee/Hip Exercises: Aerobic   Nustep level 5x 8 minutes      Knee/Hip Exercises: Seated   Long Arc Quad Strengthening;10 reps;3 sets    [employer] Weight 5 lbs.    Other Seated Knee/Hip Exercises red tband left hip adduction, ankle PF/DF    Hamstring Curl Strengthening;Both;2 sets;15 reps    Hamstring Limitations green      Manual Therapy   Passive ROM left ankle for pain with approximation  PT Short Term Goals - 11/01/21 1056       PT SHORT TERM GOAL #1   Title independent with initial HEP    Status Achieved               PT Long Term Goals - 01/19/22 1152       PT LONG TERM GOAL #1   Title walk with FWW x 150 feet without rest with supervision only    Status On-going      PT LONG TERM GOAL #2   Title increase left knee strength to 4/5    Status Partially Met      PT LONG TERM GOAL #3   Title decrease TUG to 33 seconds    Status On-going      PT LONG TERM GOAL #4   Title transfer independently with set up    Status Partially Met      PT LONG TERM GOAL #5   Title report pain in the shoulder and ankle 50% less    Status Partially Met                   Plan - 01/24/22 1101     Clinical Impression Statement Patient reports that with the rainy weather she is really hurting more in all of her joints and especially having troubl ewith the left ankle.  She reports pain and it rolling with transfers with her caregiver yesterday and today.  I did a lot of  motions and approximation with the left ankle and this seemed to help after a few times.  She is still struggling with her breathing tried a new drug for this and she reports that she felt worse so she is back on albuterol, she does report having a different lung test coming in the next few weeks.    PT Next Visit Plan I would really like to get her walking better prior to us discharging    Consulted and Agree with Plan of Care Patient             Patient will benefit from skilled therapeutic intervention in order to improve the following deficits and impairments:  Abnormal gait, Decreased range of motion, Difficulty walking, Impaired UE functional use, Increased muscle spasms, Cardiopulmonary status limiting activity, Decreased endurance, Decreased activity tolerance, Pain, Decreased balance, Decreased mobility, Decreased strength  Visit Diagnosis: Muscle weakness (generalized)  Pain in left ankle and joints of left foot  Difficulty in walking, not elsewhere classified  Late effects of CVA (cerebrovascular accident)  Left shoulder pain, unspecified chronicity  Hemiplegia and hemiparesis following cerebral infarction affecting left non-dominant side (HCC)  Ataxia     Problem List Patient Active Problem List   Diagnosis Date Noted   Goals of care, counseling/discussion 01/17/2022   Grief 01/17/2022   Secondary hypercoagulable state (HCC) 03/16/2021   Dizziness 03/10/2021   Orthostatic hypotension 10/15/2020   Presbycusis of both ears 03/08/2020   Mixed stress and urge urinary incontinence 12/02/2019   Macrocytosis 12/01/2019   Nutritional anemia 12/01/2019   S/P total knee replacement 01/27/2019   Recurrent left knee instability 07/05/2018   Respiratory failure with hypoxia (HCC) 08/30/2017   Hypoxemia    Heart failure with preserved ejection fraction (HCC), Grade 3 diastolic dysfunction 03/26/2017   PAF (paroxysmal atrial fibrillation) (HCC)    Dyspnea 03/19/2017    Encounter for preventive health examination 02/17/2016   Sensorineural hearing loss (SNHL), bilateral 01/26/2016   Hypomagnesemia 04/24/2014   Hemiparesis affecting left   side as late effect of cerebrovascular accident ([place]) 04/24/2014   Nontraumatic cerebral hemorrhage ([place]) 04/30/2012   DM (diabetes mellitus) with complications ([place]) 49/67/5916   OSTEOPENIA 01/21/2009   UNSPECIFIED VITAMIN D DEFICIENCY 11/19/2007   HYPERCHOLESTEROLEMIA 10/25/2006   GASTROESOPHAGEAL REFLUX, NO ESOPHAGITIS 10/25/2006   DIVERTICULOSIS OF COLON 10/25/2006   Osteoarthritis 10/25/2006   CERVICAL SPINE DISORDER, NOS 10/25/2006    Sumner Boast, PT 01/24/2022, 11:40 AM  [place]. [place], [place], [address] Phone: [phone]   Fax:  [phone]  Name: Karen Jackson MRN: [id number] Date of Birth: 1941/07/20

## 2022-01-26 ENCOUNTER — Other Ambulatory Visit: Payer: Self-pay | Admitting: Family Medicine

## 2022-01-26 ENCOUNTER — Encounter: Payer: Self-pay | Admitting: Physical Therapy

## 2022-01-26 ENCOUNTER — Ambulatory Visit: Payer: Medicare Other | Attending: Family Medicine | Admitting: Physical Therapy

## 2022-01-26 DIAGNOSIS — I6990 Unspecified sequelae of unspecified cerebrovascular disease: Secondary | ICD-10-CM

## 2022-01-26 DIAGNOSIS — M25572 Pain in left ankle and joints of left foot: Secondary | ICD-10-CM | POA: Diagnosis not present

## 2022-01-26 DIAGNOSIS — M6281 Muscle weakness (generalized): Secondary | ICD-10-CM | POA: Diagnosis not present

## 2022-01-26 DIAGNOSIS — R262 Difficulty in walking, not elsewhere classified: Secondary | ICD-10-CM

## 2022-01-26 DIAGNOSIS — I5032 Chronic diastolic (congestive) heart failure: Secondary | ICD-10-CM

## 2022-01-26 DIAGNOSIS — R270 Ataxia, unspecified: Secondary | ICD-10-CM | POA: Diagnosis not present

## 2022-01-26 DIAGNOSIS — I69354 Hemiplegia and hemiparesis following cerebral infarction affecting left non-dominant side: Secondary | ICD-10-CM

## 2022-01-26 DIAGNOSIS — M25512 Pain in left shoulder: Secondary | ICD-10-CM

## 2022-01-26 MED ORDER — POTASSIUM CHLORIDE CRYS ER 10 MEQ PO TBCR: 10.0000 meq | EXTENDED_RELEASE_TABLET | Freq: Two times a day (BID) | ORAL | 3 refills | Status: DC

## 2022-01-26 NOTE — Telephone Encounter (Signed)
Called and verified that she wanted lidocaine patch refill.  She also indicated that she needed refill on her potassium supplementation.

## 2022-01-26 NOTE — Therapy (Signed)
[place]. [place], [place], [address] Phone: [phone]   Fax:  [phone]  Physical Therapy Treatment  Patient Details  Name: Karen Jackson MRN: [id number] Date of Birth: 1940-09-23 Referring Provider (PT): Hensel   Encounter Date: 01/26/2022   PT End of Session - 01/26/22 1057     Visit Number 64    Date for PT Re-Evaluation 02/19/22    Authorization Type Medicare    PT Start Time 1052    PT Stop Time 1141    PT Time Calculation (min) 49 min    Activity Tolerance Patient limited by fatigue    Behavior During Therapy [facility] for tasks assessed/performed             Past Medical History:  Diagnosis Date   Acute cystitis without hematuria    Acute diastolic CHF (congestive heart failure) (HCC)    Arthritis    Dyspnea    Dysrhythmia    Fever of unknown origin 03/19/2017   Hyperlipidemia    Hypertension    denies at preop   Multifocal pneumonia    Neuromuscular disorder ([place])    neuropathy left arm and foot   Osteopenia    Paralysis ([place])    partial left side from CVA    Persistent atrial fibrillation (HCC)    PONV (postoperative nausea and vomiting)    Pre-diabetes    Stroke [facility]) 2013   hemmorahgic    Past Surgical History:  Procedure Laterality Date   ANKLE SURGERY     APPENDECTOMY     CHOLECYSTECTOMY     HERNIA REPAIR     Esophagus   JOINT REPLACEMENT     total- right partial- left   MASTECTOMY PARTIAL / LUMPECTOMY  2012   left   ORIF ANKLE FRACTURE Left 07/20/2018   Procedure: OPEN REDUCTION INTERNAL FIXATION (ORIF) ANKLE FRACTURE;  Surgeon: Wylene Simmer, MD;  Location: [place];  Service: Orthopedics;  Laterality: Left;   TOTAL KNEE ARTHROPLASTY Left 01/27/2019   Procedure: TOTAL KNEE ARTHROPLASTY;  Surgeon: Vickey Huger, MD;  [facility];  Service: Orthopedics;  Laterality: Left;    There were no vitals filed for this visit.   Subjective Assessment - 01/26/22 1100      Subjective Patient reports that she felt better after the last visit.  Reports that the ankle is very stiff, difficulty transferring    Currently in Pain? Yes    Pain Score 4     Pain Location Ankle    Pain Orientation Left                               [facility] Adult PT Treatment/Exercise - 01/26/22 0001       Ambulation/Gait   Gait Comments 4 x 60' with CGA stopping at 75' due to breathing      Knee/Hip Exercises: Aerobic   Nustep level 5x 8 minutes      Knee/Hip Exercises: Seated   Other Seated Knee/Hip Exercises red tband left hip adduction, ankle PF/DF    Hamstring Curl Strengthening;Both;2 sets;15 reps    Hamstring Limitations green      Knee/Hip Exercises: Supine   Bridges 10 reps;2 sets    Bridges with [employer] 10 reps;2 sets    Other Supine Knee/Hip Exercises left hip on sliding board abduction and adduction, knee to chest feet on ball  PT Short Term Goals - 11/01/21 1056       PT SHORT TERM GOAL #1   Title independent with initial HEP    Status Achieved               PT Long Term Goals - 01/26/22 1102       PT LONG TERM GOAL #1   Title walk with FWW x 150 feet without rest with supervision only    Status On-going      PT LONG TERM GOAL #2   Title increase left knee strength to 4/5    Status Partially Met                   Plan - 01/26/22 1209     Clinical Impression Statement Patient feeling better than earlier in the week,  She still has some ankle discomfort, her biggest limiter is her breathing, she reports that she is taking the lasix, she has new albuterol and tomorrow will have a lung scan.  60' is about her max now and stops due to SOB.  I started teaching her cregiver HEP with tband today, biggest issue is cargiver ability to undestand and perform    PT Next Visit Plan I would really like to get her walking better prior to us discharging    PT Home Exercise Plan red tband  ankle exercises with caregiver    Consulted and Agree with Plan of Care Patient             Patient will benefit from skilled therapeutic intervention in order to improve the following deficits and impairments:  Abnormal gait, Decreased range of motion, Difficulty walking, Impaired UE functional use, Increased muscle spasms, Cardiopulmonary status limiting activity, Decreased endurance, Decreased activity tolerance, Pain, Decreased balance, Decreased mobility, Decreased strength  Visit Diagnosis: Muscle weakness (generalized)  Pain in left ankle and joints of left foot  Difficulty in walking, not elsewhere classified  Late effects of CVA (cerebrovascular accident)  Left shoulder pain, unspecified chronicity  Hemiplegia and hemiparesis following cerebral infarction affecting left non-dominant side (HCC)  Ataxia     Problem List Patient Active Problem List   Diagnosis Date Noted   Goals of care, counseling/discussion 01/17/2022   Grief 01/17/2022   Secondary hypercoagulable state (HCC) 03/16/2021   Dizziness 03/10/2021   Orthostatic hypotension 10/15/2020   Presbycusis of both ears 03/08/2020   Mixed stress and urge urinary incontinence 12/02/2019   Macrocytosis 12/01/2019   Nutritional anemia 12/01/2019   S/P total knee replacement 01/27/2019   Recurrent left knee instability 07/05/2018   Respiratory failure with hypoxia (HCC) 08/30/2017   Hypoxemia    Heart failure with preserved ejection fraction (HCC), Grade 3 diastolic dysfunction 03/26/2017   PAF (paroxysmal atrial fibrillation) (HCC)    Dyspnea 03/19/2017   Encounter for preventive health examination 02/17/2016   Sensorineural hearing loss (SNHL), bilateral 01/26/2016   Hypomagnesemia 04/24/2014   Hemiparesis affecting left side as late effect of cerebrovascular accident (HCC) 04/24/2014   Nontraumatic cerebral hemorrhage (HCC) 04/30/2012   DM (diabetes mellitus) with complications (HCC) 03/04/2010    OSTEOPENIA 01/21/2009   UNSPECIFIED VITAMIN D DEFICIENCY 11/19/2007   HYPERCHOLESTEROLEMIA 10/25/2006   GASTROESOPHAGEAL REFLUX, NO ESOPHAGITIS 10/25/2006   DIVERTICULOSIS OF COLON 10/25/2006   Osteoarthritis 10/25/2006   CERVICAL SPINE DISORDER, NOS 10/25/2006    , W, PT 01/26/2022, 12:12 PM  [facility] [address] [place], [place], [address] Phone: [phone]   Fax:    [phone]  Name: Sweetie F [place]veland MRN: [id number] Date of Birth: 01/19/1941    

## 2022-01-27 ENCOUNTER — Ambulatory Visit (HOSPITAL_COMMUNITY)
Admission: RE | Admit: 2022-01-27 | Discharge: 2022-01-27 | Disposition: A | Discharge status: Home / Self Care | Payer: Medicare Other | Source: Ambulatory Visit | Attending: Pulmonary Disease | Admitting: Pulmonary Disease

## 2022-01-27 DIAGNOSIS — R0602 Shortness of breath: Secondary | ICD-10-CM | POA: Diagnosis not present

## 2022-01-27 DIAGNOSIS — J984 Other disorders of lung: Secondary | ICD-10-CM | POA: Diagnosis not present

## 2022-01-27 DIAGNOSIS — I2510 Atherosclerotic heart disease of native coronary artery without angina pectoris: Secondary | ICD-10-CM | POA: Diagnosis not present

## 2022-01-27 DIAGNOSIS — I7789 Other specified disorders of arteries and arterioles: Secondary | ICD-10-CM | POA: Diagnosis not present

## 2022-01-27 DIAGNOSIS — J84112 Idiopathic pulmonary fibrosis: Secondary | ICD-10-CM | POA: Diagnosis not present

## 2022-01-30 ENCOUNTER — Ambulatory Visit: Payer: Medicare Other | Admitting: Physical Therapy

## 2022-01-30 ENCOUNTER — Encounter: Payer: Self-pay | Admitting: Physical Therapy

## 2022-01-30 DIAGNOSIS — R262 Difficulty in walking, not elsewhere classified: Secondary | ICD-10-CM

## 2022-01-30 DIAGNOSIS — I69354 Hemiplegia and hemiparesis following cerebral infarction affecting left non-dominant side: Secondary | ICD-10-CM | POA: Diagnosis not present

## 2022-01-30 DIAGNOSIS — M25512 Pain in left shoulder: Secondary | ICD-10-CM

## 2022-01-30 DIAGNOSIS — I6990 Unspecified sequelae of unspecified cerebrovascular disease: Secondary | ICD-10-CM | POA: Diagnosis not present

## 2022-01-30 DIAGNOSIS — R270 Ataxia, unspecified: Secondary | ICD-10-CM

## 2022-01-30 DIAGNOSIS — M6281 Muscle weakness (generalized): Secondary | ICD-10-CM

## 2022-01-30 DIAGNOSIS — M25572 Pain in left ankle and joints of left foot: Secondary | ICD-10-CM

## 2022-01-30 NOTE — Therapy (Signed)
[place]. [place], [place], [address] Phone: [phone]   Fax:  [phone]  Physical Therapy Treatment  Patient Details  Name: Karen Jackson MRN: [id number] Date of Birth: 11-21-1940 Referring Provider (PT): Hensel   Encounter Date: 01/30/2022   PT End of Session - 01/30/22 1016     Visit Number 9    Date for PT Re-Evaluation 02/19/22    Authorization Type Medicare    PT Start Time 1010    PT Stop Time 1055    PT Time Calculation (min) 45 min    Activity Tolerance Patient limited by fatigue    Behavior During Therapy [facility] for tasks assessed/performed             Past Medical History:  Diagnosis Date   Acute cystitis without hematuria    Acute diastolic CHF (congestive heart failure) (HCC)    Arthritis    Dyspnea    Dysrhythmia    Fever of unknown origin 03/19/2017   Hyperlipidemia    Hypertension    denies at preop   Multifocal pneumonia    Neuromuscular disorder ([place])    neuropathy left arm and foot   Osteopenia    Paralysis ([place])    partial left side from CVA    Persistent atrial fibrillation (HCC)    PONV (postoperative nausea and vomiting)    Pre-diabetes    Stroke [facility]) 2013   hemmorahgic    Past Surgical History:  Procedure Laterality Date   ANKLE SURGERY     APPENDECTOMY     CHOLECYSTECTOMY     HERNIA REPAIR     Esophagus   JOINT REPLACEMENT     total- right partial- left   MASTECTOMY PARTIAL / LUMPECTOMY  2012   left   ORIF ANKLE FRACTURE Left 07/20/2018   Procedure: OPEN REDUCTION INTERNAL FIXATION (ORIF) ANKLE FRACTURE;  Surgeon: Wylene Simmer, MD;  Location: [place];  Service: Orthopedics;  Laterality: Left;   TOTAL KNEE ARTHROPLASTY Left 01/27/2019   Procedure: TOTAL KNEE ARTHROPLASTY;  Surgeon: Vickey Huger, MD;  [facility];  Service: Orthopedics;  Laterality: Left;    There were no vitals filed for this visit.   Subjective Assessment - 01/30/22 1016      Subjective I had a lung scan last week.  I am tired normally, can't do much    Currently in Pain? No/denies                               [facility] Adult PT Treatment/Exercise - 01/30/22 0001       Ambulation/Gait   Gait Comments 5x48' with CGA      Knee/Hip Exercises: Aerobic   Nustep level 5x 8 minutes      Knee/Hip Exercises: Seated   Other Seated Knee/Hip Exercises worked on core sitting on mat table, partial crunches, volley ball hits, no weight and not trunk support LAQ and marches difficult for her without the support      Knee/Hip Exercises: Supine   Bridges 10 reps;2 sets    Bridges with [employer] 10 reps;2 sets                       PT Short Term Goals - 11/01/21 1056       PT SHORT TERM GOAL #1   Title independent with initial HEP    Status Achieved  PT Long Term Goals - 01/30/22 1059       PT LONG TERM GOAL #3   Title decrease TUG to 33 seconds    Status On-going                   Plan - 01/30/22 1059     Clinical Impression Statement Patient had lung scan last week but does not have  a follow up with MD about it, she is to call today, we did an extra walk but they were shorter, she continues to report fatigue with walking due to breathing.    PT Next Visit Plan I would really like to get her walking better prior to [place] discharging    Consulted and Agree with Plan of Care Patient             Patient will benefit from skilled therapeutic intervention in order to improve the following deficits and impairments:  Abnormal gait, Decreased range of motion, Difficulty walking, Impaired UE functional use, Increased muscle spasms, Cardiopulmonary status limiting activity, Decreased endurance, Decreased activity tolerance, Pain, Decreased balance, Decreased mobility, Decreased strength  Visit Diagnosis: Muscle weakness (generalized)  Pain in left ankle and joints of left foot  Difficulty in  walking, not elsewhere classified  Late effects of CVA (cerebrovascular accident)  Left shoulder pain, unspecified chronicity  Hemiplegia and hemiparesis following cerebral infarction affecting left non-dominant side [facility])  Ataxia     Problem List Patient Active Problem List   Diagnosis Date Noted   Goals of care, counseling/discussion 01/17/2022   Grief 01/17/2022   Secondary hypercoagulable state ([place]) 03/16/2021   Dizziness 03/10/2021   Orthostatic hypotension 10/15/2020   Presbycusis of both ears 03/08/2020   Mixed stress and urge urinary incontinence 12/02/2019   Macrocytosis 12/01/2019   Nutritional anemia 12/01/2019   S/P total knee replacement 01/27/2019   Recurrent left knee instability 07/05/2018   Respiratory failure with hypoxia ([place]) 08/30/2017   Hypoxemia    Heart failure with preserved ejection fraction (HCC), Grade 3 diastolic dysfunction 70/26/3785   PAF (paroxysmal atrial fibrillation) ([place])    Dyspnea 03/19/2017   Encounter for preventive health examination 02/17/2016   Sensorineural hearing loss (SNHL), bilateral 01/26/2016   Hypomagnesemia 04/24/2014   Hemiparesis affecting left side as late effect of cerebrovascular accident ([place]) 04/24/2014   Nontraumatic cerebral hemorrhage ([place]) 04/30/2012   DM (diabetes mellitus) with complications ([place]) 88/50/2774   OSTEOPENIA 01/21/2009   UNSPECIFIED VITAMIN D DEFICIENCY 11/19/2007   HYPERCHOLESTEROLEMIA 10/25/2006   GASTROESOPHAGEAL REFLUX, NO ESOPHAGITIS 10/25/2006   DIVERTICULOSIS OF COLON 10/25/2006   Osteoarthritis 10/25/2006   CERVICAL SPINE DISORDER, NOS 10/25/2006    Sumner Boast, PT 01/30/2022, 11:30 AM  [place]. [place], [place], [address] Phone: [phone]   Fax:  [phone]  Name: Karen Jackson MRN: [id number] Date of Birth: 07/27/1941

## 2022-01-31 ENCOUNTER — Encounter: Payer: Self-pay | Admitting: *Deleted

## 2022-02-01 ENCOUNTER — Encounter: Payer: Self-pay | Admitting: Physical Therapy

## 2022-02-06 ENCOUNTER — Ambulatory Visit: Payer: Medicare Other | Admitting: Physical Therapy

## 2022-02-06 ENCOUNTER — Encounter: Payer: Self-pay | Admitting: Physical Therapy

## 2022-02-06 DIAGNOSIS — I69354 Hemiplegia and hemiparesis following cerebral infarction affecting left non-dominant side: Secondary | ICD-10-CM

## 2022-02-06 DIAGNOSIS — M25572 Pain in left ankle and joints of left foot: Secondary | ICD-10-CM | POA: Diagnosis not present

## 2022-02-06 DIAGNOSIS — R270 Ataxia, unspecified: Secondary | ICD-10-CM

## 2022-02-06 DIAGNOSIS — M6281 Muscle weakness (generalized): Secondary | ICD-10-CM | POA: Diagnosis not present

## 2022-02-06 DIAGNOSIS — R262 Difficulty in walking, not elsewhere classified: Secondary | ICD-10-CM | POA: Diagnosis not present

## 2022-02-06 DIAGNOSIS — I6990 Unspecified sequelae of unspecified cerebrovascular disease: Secondary | ICD-10-CM

## 2022-02-06 DIAGNOSIS — M25512 Pain in left shoulder: Secondary | ICD-10-CM | POA: Diagnosis not present

## 2022-02-06 NOTE — Therapy (Signed)
[place]. [place], [place], [address] Phone: [phone]   Fax:  [phone] Progress Note Reporting Period 12/19/21 to 02/06/22 for visits 31-40  See note below for Objective Data and Assessment of Progress/Goals.     Physical Therapy Treatment  Patient Details  Name: Karen Jackson MRN: [id number] Date of Birth: Feb 23, 1941 Referring Provider (PT): Hensel   Encounter Date: 02/06/2022   PT End of Session - 02/06/22 1125     Visit Number 40    Date for PT Re-Evaluation 02/19/22    Authorization Type Medicare    PT Start Time 1008    PT Stop Time 8850    PT Time Calculation (min) 43 min    Activity Tolerance Patient limited by fatigue    Behavior During Therapy [facility] for tasks assessed/performed             Past Medical History:  Diagnosis Date   Acute cystitis without hematuria    Acute diastolic CHF (congestive heart failure) (HCC)    Arthritis    Dyspnea    Dysrhythmia    Fever of unknown origin 03/19/2017   Hyperlipidemia    Hypertension    denies at preop   Multifocal pneumonia    Neuromuscular disorder ([place])    neuropathy left arm and foot   Osteopenia    Paralysis ([place])    partial left side from CVA    Persistent atrial fibrillation (HCC)    PONV (postoperative nausea and vomiting)    Pre-diabetes    Stroke [facility]) 2013   hemmorahgic    Past Surgical History:  Procedure Laterality Date   ANKLE SURGERY     APPENDECTOMY     CHOLECYSTECTOMY     HERNIA REPAIR     Esophagus   JOINT REPLACEMENT     total- right partial- left   MASTECTOMY PARTIAL / LUMPECTOMY  2012   left   ORIF ANKLE FRACTURE Left 07/20/2018   Procedure: OPEN REDUCTION INTERNAL FIXATION (ORIF) ANKLE FRACTURE;  Surgeon: Wylene Simmer, MD;  Location: [place];  Service: Orthopedics;  Laterality: Left;   TOTAL KNEE ARTHROPLASTY Left 01/27/2019   Procedure: TOTAL KNEE ARTHROPLASTY;  Surgeon: Vickey Huger, MD;  [facility];   Service: Orthopedics;  Laterality: Left;    There were no vitals filed for this visit.   Subjective Assessment - 02/06/22 1126     Subjective I had some issues this past weekend, had shortness of breath so I stayed in bed and took two lasix, I am feeling better breathing wise but I am very stiff and sore all over.    Currently in Pain? Yes    Pain Score 6     Pain Location Shoulder    Pain Orientation Left    Pain Descriptors / Indicators Shooting                                          PT Short Term Goals - 11/01/21 1056       PT SHORT TERM GOAL #1   Title independent with initial HEP    Status Achieved               PT Long Term Goals - 02/06/22 1127       PT LONG TERM GOAL #1   Title walk with FWW x 150 feet without rest with  supervision only    Status On-going      PT LONG TERM GOAL #2   Title increase left knee strength to 4/5    Status Partially Met      PT LONG TERM GOAL #3   Title decrease TUG to 33 seconds    Status On-going      PT LONG TERM GOAL #4   Title transfer independently with set up    Status Partially Met      PT LONG TERM GOAL #5   Title report pain in the shoulder and ankle 50% less    Status Partially Met                   Plan - 02/06/22 1156     Clinical Impression Statement Patient with new shoes and did clear foot better and walked okay, still limited iwth her distance.  She had a lot more pain today in the left shoulder, neck and upper arm, she was in bed most of the weekend.    PT Next Visit Plan work on her ability and treat pain as needed    Consulted and Agree with Plan of Care Patient             Patient will benefit from skilled therapeutic intervention in order to improve the following deficits and impairments:  Abnormal gait, Decreased range of motion, Difficulty walking, Impaired UE functional use, Increased muscle spasms, Cardiopulmonary status limiting activity,  Decreased endurance, Decreased activity tolerance, Pain, Decreased balance, Decreased mobility, Decreased strength  Visit Diagnosis: Muscle weakness (generalized)  Pain in left ankle and joints of left foot  Difficulty in walking, not elsewhere classified  Late effects of CVA (cerebrovascular accident)  Left shoulder pain, unspecified chronicity  Hemiplegia and hemiparesis following cerebral infarction affecting left non-dominant side (HCC)  Ataxia     Problem List Patient Active Problem List   Diagnosis Date Noted   Goals of care, counseling/discussion 01/17/2022   Grief 01/17/2022   Secondary hypercoagulable state ([place]) 03/16/2021   Dizziness 03/10/2021   Orthostatic hypotension 10/15/2020   Presbycusis of both ears 03/08/2020   Mixed stress and urge urinary incontinence 12/02/2019   Macrocytosis 12/01/2019   Nutritional anemia 12/01/2019   S/P total knee replacement 01/27/2019   Recurrent left knee instability 07/05/2018   Respiratory failure with hypoxia ([place]) 08/30/2017   Hypoxemia    Heart failure with preserved ejection fraction (HCC), Grade 3 diastolic dysfunction 37/90/2409   PAF (paroxysmal atrial fibrillation) ([place])    Dyspnea 03/19/2017   Encounter for preventive health examination 02/17/2016   Sensorineural hearing loss (SNHL), bilateral 01/26/2016   Hypomagnesemia 04/24/2014   Hemiparesis affecting left side as late effect of cerebrovascular accident ([place]) 04/24/2014   Nontraumatic cerebral hemorrhage ([place]) 04/30/2012   DM (diabetes mellitus) with complications ([place]) 73/53/2992   OSTEOPENIA 01/21/2009   UNSPECIFIED VITAMIN D DEFICIENCY 11/19/2007   HYPERCHOLESTEROLEMIA 10/25/2006   GASTROESOPHAGEAL REFLUX, NO ESOPHAGITIS 10/25/2006   DIVERTICULOSIS OF COLON 10/25/2006   Osteoarthritis 10/25/2006   CERVICAL SPINE DISORDER, NOS 10/25/2006    Sumner Boast, PT 02/06/2022, 11:58 AM  [place]. [place], [place], [address] Phone: [phone]   Fax:  [phone]  Name: Karen Jackson MRN: [id number] Date of Birth: 08-Sep-1940

## 2022-02-07 ENCOUNTER — Telehealth: Payer: Self-pay

## 2022-02-07 NOTE — Telephone Encounter (Signed)
Informed pt 4 boxes of ozempic are ready for pickup.  Meds are labeled and ready in med room fridge  ('1mg'$  weekly dose - 4 month supply)

## 2022-02-08 ENCOUNTER — Encounter: Payer: Self-pay | Admitting: Physical Therapy

## 2022-02-08 ENCOUNTER — Telehealth: Payer: Self-pay | Admitting: Pulmonary Disease

## 2022-02-08 ENCOUNTER — Ambulatory Visit: Payer: Medicare Other | Admitting: Physical Therapy

## 2022-02-08 DIAGNOSIS — M25512 Pain in left shoulder: Secondary | ICD-10-CM

## 2022-02-08 DIAGNOSIS — I6990 Unspecified sequelae of unspecified cerebrovascular disease: Secondary | ICD-10-CM

## 2022-02-08 DIAGNOSIS — M6281 Muscle weakness (generalized): Secondary | ICD-10-CM

## 2022-02-08 DIAGNOSIS — I69354 Hemiplegia and hemiparesis following cerebral infarction affecting left non-dominant side: Secondary | ICD-10-CM

## 2022-02-08 DIAGNOSIS — R270 Ataxia, unspecified: Secondary | ICD-10-CM

## 2022-02-08 DIAGNOSIS — R262 Difficulty in walking, not elsewhere classified: Secondary | ICD-10-CM | POA: Diagnosis not present

## 2022-02-08 DIAGNOSIS — M25572 Pain in left ankle and joints of left foot: Secondary | ICD-10-CM | POA: Diagnosis not present

## 2022-02-08 NOTE — Therapy (Signed)
[place]. [place], [place], [address] Phone: [phone]   Fax:  [phone]  Physical Therapy Treatment  Patient Details  Name: Karen Jackson MRN: [id number] Date of Birth: 02-08-1941 Referring Provider (PT): Hensel   Encounter Date: 02/08/2022   PT End of Session - 02/08/22 1040     Visit Number 41    Date for PT Re-Evaluation 02/19/22    Authorization Type Medicare    PT Start Time 1008    PT Stop Time 7366    PT Time Calculation (min) 44 min    Activity Tolerance Patient limited by fatigue    Behavior During Therapy [facility] for tasks assessed/performed             Past Medical History:  Diagnosis Date   Acute cystitis without hematuria    Acute diastolic CHF (congestive heart failure) (HCC)    Arthritis    Dyspnea    Dysrhythmia    Fever of unknown origin 03/19/2017   Hyperlipidemia    Hypertension    denies at preop   Multifocal pneumonia    Neuromuscular disorder ([place])    neuropathy left arm and foot   Osteopenia    Paralysis ([place])    partial left side from CVA    Persistent atrial fibrillation (HCC)    PONV (postoperative nausea and vomiting)    Pre-diabetes    Stroke [facility]) 2013   hemmorahgic    Past Surgical History:  Procedure Laterality Date   ANKLE SURGERY     APPENDECTOMY     CHOLECYSTECTOMY     HERNIA REPAIR     Esophagus   JOINT REPLACEMENT     total- right partial- left   MASTECTOMY PARTIAL / LUMPECTOMY  2012   left   ORIF ANKLE FRACTURE Left 07/20/2018   Procedure: OPEN REDUCTION INTERNAL FIXATION (ORIF) ANKLE FRACTURE;  Surgeon: Wylene Simmer, MD;  Location: [place];  Service: Orthopedics;  Laterality: Left;   TOTAL KNEE ARTHROPLASTY Left 01/27/2019   Procedure: TOTAL KNEE ARTHROPLASTY;  Surgeon: Vickey Huger, MD;  [facility];  Service: Orthopedics;  Laterality: Left;    There were no vitals filed for this visit.   Subjective Assessment - 02/08/22 1041      Subjective Not bas, I felt better after the last treatment    Currently in Pain? No/denies                               [facility] Adult PT Treatment/Exercise - 02/08/22 0001       Ambulation/Gait   Gait Comments 75', 73' and 34 ' she really had difficulty with breathing.      Knee/Hip Exercises: Aerobic   Nustep level 5x 7 minutes      Knee/Hip Exercises: Standing   Hip Abduction 2 sets;10 reps;Both    Abduction Limitations in pbars    Hip Extension Both;2 sets;10 reps    Extension Limitations in pbars      Knee/Hip Exercises: Seated   Other Seated Knee/Hip Exercises worked on core sitting on mat table, partial crunches, volley ball hits, no weight and not trunk support LAQ and marches difficult for her without the support    Other Seated Knee/Hip Exercises red tband left hip adduction, ankle PF/DF    Hamstring Curl Strengthening;Both;2 sets;15 reps  PT Short Term Goals - 11/01/21 1056       PT SHORT TERM GOAL #1   Title independent with initial HEP    Status Achieved               PT Long Term Goals - 02/08/22 1041       PT LONG TERM GOAL #1   Title walk with FWW x 150 feet without rest with supervision only    Status Partially Met                   Plan - 02/08/22 1106     Clinical Impression Statement Patietn seems to do better in the new shoes for the first few steps compared to before.  She however still is really struggling with the breathing, she reports doing albuterol this AM and still struggled going more than 1' and then was not able to match that again.  She will not see the pulmonologist for quite some time, I did ask her to possibly talk with her GP as she was doing 100 feet pretty consistently prior to 2 months ago, she has had a significant life event b/n those time but the lung scan is compared to years ago and does not seem to show much difference.    PT Next Visit Plan look to d/c next  week    Consulted and Agree with Plan of Care Patient             Patient will benefit from skilled therapeutic intervention in order to improve the following deficits and impairments:  Abnormal gait, Decreased range of motion, Difficulty walking, Impaired UE functional use, Increased muscle spasms, Cardiopulmonary status limiting activity, Decreased endurance, Decreased activity tolerance, Pain, Decreased balance, Decreased mobility, Decreased strength  Visit Diagnosis: Muscle weakness (generalized)  Pain in left ankle and joints of left foot  Difficulty in walking, not elsewhere classified  Late effects of CVA (cerebrovascular accident)  Left shoulder pain, unspecified chronicity  Hemiplegia and hemiparesis following cerebral infarction affecting left non-dominant side [facility])  Ataxia     Problem List Patient Active Problem List   Diagnosis Date Noted   Goals of care, counseling/discussion 01/17/2022   Grief 01/17/2022   Secondary hypercoagulable state ([place]) 03/16/2021   Dizziness 03/10/2021   Orthostatic hypotension 10/15/2020   Presbycusis of both ears 03/08/2020   Mixed stress and urge urinary incontinence 12/02/2019   Macrocytosis 12/01/2019   Nutritional anemia 12/01/2019   S/P total knee replacement 01/27/2019   Recurrent left knee instability 07/05/2018   Respiratory failure with hypoxia ([place]) 08/30/2017   Hypoxemia    Heart failure with preserved ejection fraction (HCC), Grade 3 diastolic dysfunction 15/40/0867   PAF (paroxysmal atrial fibrillation) ([place])    Dyspnea 03/19/2017   Encounter for preventive health examination 02/17/2016   Sensorineural hearing loss (SNHL), bilateral 01/26/2016   Hypomagnesemia 04/24/2014   Hemiparesis affecting left side as late effect of cerebrovascular accident ([place]) 04/24/2014   Nontraumatic cerebral hemorrhage ([place]) 04/30/2012   DM (diabetes mellitus) with complications ([place]) 61/95/0932   OSTEOPENIA 01/21/2009    UNSPECIFIED VITAMIN D DEFICIENCY 11/19/2007   HYPERCHOLESTEROLEMIA 10/25/2006   GASTROESOPHAGEAL REFLUX, NO ESOPHAGITIS 10/25/2006   DIVERTICULOSIS OF COLON 10/25/2006   Osteoarthritis 10/25/2006   CERVICAL SPINE DISORDER, NOS 10/25/2006    Sumner Boast, PT 02/08/2022, 11:08 AM  [place]. [place], [place], [address] Phone: [phone]   Fax:  [phone]  Name: Adleigh Mcmasters  [place] MRN: [id number] Date of Birth: 1940/09/27

## 2022-02-09 NOTE — Telephone Encounter (Signed)
Patient's caregiver presents to clinic to pick up medication. Provided with patient's driver's license for verification.   Medication provided per previous note.   Talbot Grumbling, RN

## 2022-02-10 NOTE — Telephone Encounter (Signed)
She is currently on lasix which is treating her leg swelling. If she has pulmonary hypertension this will treat it. It looks like Dr. Erin Fulling was setting her up for a right heart catheterization with cardiology to confirm the diagnosis. I will defer to Dr Erin Fulling when he is back about that procedure.

## 2022-02-10 NOTE — Telephone Encounter (Signed)
Called patient and she states that she has pulmonary hypertension and is wanting to know if she needs to be on medications for this. Dr Erin Fulling do you know if she has a dx of pulm htn? If so do you advise any medication at this time or do you want me to schedule her a follow up with you in office to discuss this?  Please advise sir

## 2022-02-13 NOTE — Telephone Encounter (Signed)
Hi Dr. Gardiner Rhyme and team,  I wanted to follow up about a heart cath for this patient to evaluate her for pulmonary hypertension.  Thanks, Wille Glaser

## 2022-02-14 ENCOUNTER — Encounter: Payer: Self-pay | Admitting: Physical Therapy

## 2022-02-14 ENCOUNTER — Ambulatory Visit: Payer: Medicare Other | Admitting: Physical Therapy

## 2022-02-14 DIAGNOSIS — M6281 Muscle weakness (generalized): Secondary | ICD-10-CM

## 2022-02-14 DIAGNOSIS — I69354 Hemiplegia and hemiparesis following cerebral infarction affecting left non-dominant side: Secondary | ICD-10-CM | POA: Diagnosis not present

## 2022-02-14 DIAGNOSIS — I6990 Unspecified sequelae of unspecified cerebrovascular disease: Secondary | ICD-10-CM | POA: Diagnosis not present

## 2022-02-14 DIAGNOSIS — M25572 Pain in left ankle and joints of left foot: Secondary | ICD-10-CM

## 2022-02-14 DIAGNOSIS — R270 Ataxia, unspecified: Secondary | ICD-10-CM

## 2022-02-14 DIAGNOSIS — M25512 Pain in left shoulder: Secondary | ICD-10-CM

## 2022-02-14 DIAGNOSIS — R262 Difficulty in walking, not elsewhere classified: Secondary | ICD-10-CM | POA: Diagnosis not present

## 2022-02-14 NOTE — Therapy (Signed)
[place]. [place], [place], [address] Phone: [phone]   Fax:  [phone]  Physical Therapy Treatment  Patient Details  Name: Karen Jackson MRN: [id number] Date of Birth: 1940/10/18 Referring Provider (PT): Hensel   Encounter Date: 02/14/2022   PT End of Session - 02/14/22 1150     Visit Number 4    Date for PT Re-Evaluation 02/19/22    Authorization Type Medicare    PT Start Time 1055    PT Stop Time 1140    PT Time Calculation (min) 45 min    Activity Tolerance Patient tolerated treatment well;Patient limited by pain    Behavior During Therapy [facility] for tasks assessed/performed             Past Medical History:  Diagnosis Date   Acute cystitis without hematuria    Acute diastolic CHF (congestive heart failure) ([place])    Arthritis    Dyspnea    Dysrhythmia    Fever of unknown origin 03/19/2017   Hyperlipidemia    Hypertension    denies at preop   Multifocal pneumonia    Neuromuscular disorder ([place])    neuropathy left arm and foot   Osteopenia    Paralysis ([place])    partial left side from CVA    Persistent atrial fibrillation (HCC)    PONV (postoperative nausea and vomiting)    Pre-diabetes    Stroke [facility]) 2013   hemmorahgic    Past Surgical History:  Procedure Laterality Date   ANKLE SURGERY     APPENDECTOMY     CHOLECYSTECTOMY     HERNIA REPAIR     Esophagus   JOINT REPLACEMENT     total- right partial- left   MASTECTOMY PARTIAL / LUMPECTOMY  2012   left   ORIF ANKLE FRACTURE Left 07/20/2018   Procedure: OPEN REDUCTION INTERNAL FIXATION (ORIF) ANKLE FRACTURE;  Surgeon: Wylene Simmer, MD;  Location: [facility];  Service: Orthopedics;  Laterality: Left;   TOTAL KNEE ARTHROPLASTY Left 01/27/2019   Procedure: TOTAL KNEE ARTHROPLASTY;  Surgeon: Vickey Huger, MD;  Location[facility];  Service: Orthopedics;  Laterality: Left;    There were no vitals filed for this visit.   Subjective  Assessment - 02/14/22 1151     Subjective Patient reports that she is having some left lateral ankle pain, she is back in her old shoes and reports a small sore on the lateral malleolous    Currently in Pain? Yes    Pain Score 5     Pain Location Ankle    Pain Orientation Left;Lateral                               [facility] Adult PT Treatment/Exercise - 02/14/22 0001       Ambulation/Gait   Gait Comments 80'x3 with FWW and CGA      Knee/Hip Exercises: Aerobic   Nustep level 5x 9 minutes      Knee/Hip Exercises: Seated   Ball Squeeze 2x10    Other Seated Knee/Hip Exercises red tband left hip adduction, ankle PF/DF    Hamstring Curl Strengthening;Both;2 sets;15 reps    Hamstring Limitations green                       PT Short Term Goals - 11/01/21 1056       PT SHORT TERM GOAL #1   Title  independent with initial HEP    Status Achieved               PT Long Term Goals - 02/08/22 1041       PT LONG TERM GOAL #1   Title walk with FWW x 150 feet without rest with supervision only    Status Partially Met                   Plan - 02/14/22 1153     Clinical Impression Statement I took her brace and shoe off, she has a small sore ont he left lateral malleolous, I examined the shoes and the brace, the padding on the brace had slipped up exposing the rigid plastic and this I believe is where the sore and her pain is coming from, i adjusted it and we she was able to walk with much less pain, we did this again and she started having pain, I again examined and the glue that holds the padding in place is worn out and it allows the padding to slide up.  I advised a new brace.    PT Next Visit Plan see if she gets new brace, will still plan to d/c    Consulted and Agree with Plan of Care Patient             Patient will benefit from skilled therapeutic intervention in order to improve the following deficits and impairments:  Abnormal  gait, Decreased range of motion, Difficulty walking, Impaired UE functional use, Increased muscle spasms, Cardiopulmonary status limiting activity, Decreased endurance, Decreased activity tolerance, Pain, Decreased balance, Decreased mobility, Decreased strength  Visit Diagnosis: Muscle weakness (generalized)  Pain in left ankle and joints of left foot  Difficulty in walking, not elsewhere classified  Late effects of CVA (cerebrovascular accident)  Left shoulder pain, unspecified chronicity  Hemiplegia and hemiparesis following cerebral infarction affecting left non-dominant side [facility])  Ataxia     Problem List Patient Active Problem List   Diagnosis Date Noted   Goals of care, counseling/discussion 01/17/2022   Grief 01/17/2022   Secondary hypercoagulable state ([place]) 03/16/2021   Dizziness 03/10/2021   Orthostatic hypotension 10/15/2020   Presbycusis of both ears 03/08/2020   Mixed stress and urge urinary incontinence 12/02/2019   Macrocytosis 12/01/2019   Nutritional anemia 12/01/2019   S/P total knee replacement 01/27/2019   Recurrent left knee instability 07/05/2018   Respiratory failure with hypoxia ([place]) 08/30/2017   Hypoxemia    Heart failure with preserved ejection fraction (HCC), Grade 3 diastolic dysfunction 11/94/1740   PAF (paroxysmal atrial fibrillation) ([place])    Dyspnea 03/19/2017   Encounter for preventive health examination 02/17/2016   Sensorineural hearing loss (SNHL), bilateral 01/26/2016   Hypomagnesemia 04/24/2014   Hemiparesis affecting left side as late effect of cerebrovascular accident ([place]) 04/24/2014   Nontraumatic cerebral hemorrhage ([place]) 04/30/2012   DM (diabetes mellitus) with complications ([place]) 81/44/8185   OSTEOPENIA 01/21/2009   UNSPECIFIED VITAMIN D DEFICIENCY 11/19/2007   HYPERCHOLESTEROLEMIA 10/25/2006   GASTROESOPHAGEAL REFLUX, NO ESOPHAGITIS 10/25/2006   DIVERTICULOSIS OF COLON 10/25/2006   Osteoarthritis 10/25/2006   CERVICAL  SPINE DISORDER, NOS 10/25/2006    Sumner Boast, PT 02/14/2022, 11:55 AM  [place]. [name], [place], [address] Phone: [phone]   Fax:  [phone]  Name: SHAKARI QAZI MRN: [id number] Date of Birth: 1941/01/05

## 2022-02-15 NOTE — Telephone Encounter (Signed)
Spoke to patient, scheduled appointment with Dr. Gardiner Rhyme next week.

## 2022-02-16 ENCOUNTER — Ambulatory Visit: Payer: Medicare Other | Admitting: Physical Therapy

## 2022-02-16 ENCOUNTER — Encounter: Payer: Self-pay | Admitting: Physical Therapy

## 2022-02-16 DIAGNOSIS — I69354 Hemiplegia and hemiparesis following cerebral infarction affecting left non-dominant side: Secondary | ICD-10-CM

## 2022-02-16 DIAGNOSIS — M25512 Pain in left shoulder: Secondary | ICD-10-CM | POA: Diagnosis not present

## 2022-02-16 DIAGNOSIS — R262 Difficulty in walking, not elsewhere classified: Secondary | ICD-10-CM | POA: Diagnosis not present

## 2022-02-16 DIAGNOSIS — M25572 Pain in left ankle and joints of left foot: Secondary | ICD-10-CM | POA: Diagnosis not present

## 2022-02-16 DIAGNOSIS — M6281 Muscle weakness (generalized): Secondary | ICD-10-CM

## 2022-02-16 DIAGNOSIS — I6990 Unspecified sequelae of unspecified cerebrovascular disease: Secondary | ICD-10-CM | POA: Diagnosis not present

## 2022-02-16 DIAGNOSIS — R270 Ataxia, unspecified: Secondary | ICD-10-CM

## 2022-02-16 NOTE — Therapy (Signed)
[place]. [place], [place], [address] Phone: [phone]   Fax:  [phone]  Physical Therapy Treatment  Patient Details  Name: Karen Jackson MRN: [id number] Date of Birth: 06-10-1941 Referring Provider (PT): Hensel   Encounter Date: 02/16/2022   PT End of Session - 02/16/22 1125     Visit Number 41    Date for PT Re-Evaluation 02/19/22    Authorization Type Medicare    PT Start Time 1055    PT Stop Time 1140    PT Time Calculation (min) 45 min    Activity Tolerance Patient tolerated treatment well;Patient limited by pain    Behavior During Therapy [facility] for tasks assessed/performed             Past Medical History:  Diagnosis Date   Acute cystitis without hematuria    Acute diastolic CHF (congestive heart failure) ([place])    Arthritis    Dyspnea    Dysrhythmia    Fever of unknown origin 03/19/2017   Hyperlipidemia    Hypertension    denies at preop   Multifocal pneumonia    Neuromuscular disorder ([place])    neuropathy left arm and foot   Osteopenia    Paralysis ([place])    partial left side from CVA    Persistent atrial fibrillation (HCC)    PONV (postoperative nausea and vomiting)    Pre-diabetes    Stroke [facility]) 2013   hemmorahgic    Past Surgical History:  Procedure Laterality Date   ANKLE SURGERY     APPENDECTOMY     CHOLECYSTECTOMY     HERNIA REPAIR     Esophagus   JOINT REPLACEMENT     total- right partial- left   MASTECTOMY PARTIAL / LUMPECTOMY  2012   left   ORIF ANKLE FRACTURE Left 07/20/2018   Procedure: OPEN REDUCTION INTERNAL FIXATION (ORIF) ANKLE FRACTURE;  Surgeon: Wylene Simmer, MD;  Location: [facility];  Service: Orthopedics;  Laterality: Left;   TOTAL KNEE ARTHROPLASTY Left 01/27/2019   Procedure: TOTAL KNEE ARTHROPLASTY;  Surgeon: Vickey Huger, MD;  [facility];  Service: Orthopedics;  Laterality: Left;    There were no vitals filed for this visit.   Subjective  Assessment - 02/16/22 1125     Subjective Patient reports felt okay for a little while after I adjusted the brace but reports pain came back and she is haivng a lot more difficulty with transfers.  She reports that she has bandaged the sore    Currently in Pain? Yes    Pain Score 5     Pain Location Ankle    Pain Orientation Left;Lateral    Aggravating Factors  sore on the ankle, bad brace has a new one ordered tracking says it will be here today                               [facility] Adult PT Treatment/Exercise - 02/16/22 0001       Ambulation/Gait   Gait Comments unable to do this today due pain up to 8/10 due to blister on the lateral malleolous      Self-Care   Self-Care Other Self-Care Comments    Other Self-Care Comments  talked with Zulema and caregivers regarding the independent fitness program, her dong the nustep, getting ankle weights and tband where to get and the exercises to do, reps, sets and form  PT Short Term Goals - 11/01/21 1056       PT SHORT TERM GOAL #1   Title independent with initial HEP    Status Achieved               PT Long Term Goals - 02/16/22 1127       PT LONG TERM GOAL #1   Title walk with FWW x 150 feet without rest with supervision only    Status Partially Met      PT LONG TERM GOAL #2   Title increase left knee strength to 4/5    Status Partially Met      PT LONG TERM GOAL #3   Title decrease TUG to 33 seconds    Status Partially Met      PT LONG TERM GOAL #4   Title transfer independently with set up    Status Partially Met      PT LONG TERM GOAL #5   Title report pain in the shoulder and ankle 50% less    Status Partially Met                   Plan - 02/16/22 1130     Clinical Impression Statement Patient has had issues with breathing and fatigue, this is a change over the past 3 months, lung scan was negative same scarring that was seen in 2019, they have  made a referral to the hear MD about a catheterization.  She had issues this pas week with a blister on the left lateral malleolous.  I feel it is the brace and she has ordered a new onem it is to arrive today, I spent time with her today about assuring that this does not get worse and to limit walking until the blister is healed, keep it coverd and protected, I would like to see how and if she get the heart catheterization as she really regressed with her ability to walk any distance over the past 2 months    PT Next Visit Plan d/c plateau in function but with issues regarding heart and a blister on the ankle    Consulted and Agree with Plan of Care Patient             Patient will benefit from skilled therapeutic intervention in order to improve the following deficits and impairments:  Abnormal gait, Decreased range of motion, Difficulty walking, Impaired UE functional use, Increased muscle spasms, Cardiopulmonary status limiting activity, Decreased endurance, Decreased activity tolerance, Pain, Decreased balance, Decreased mobility, Decreased strength  Visit Diagnosis: Muscle weakness (generalized)  Pain in left ankle and joints of left foot  Difficulty in walking, not elsewhere classified  Late effects of CVA (cerebrovascular accident)  Left shoulder pain, unspecified chronicity  Hemiplegia and hemiparesis following cerebral infarction affecting left non-dominant side ([place])  Ataxia     Problem List Patient Active Problem List   Diagnosis Date Noted   Goals of care, counseling/discussion 01/17/2022   Grief 01/17/2022   Secondary hypercoagulable state ([place]) 03/16/2021   Dizziness 03/10/2021   Orthostatic hypotension 10/15/2020   Presbycusis of both ears 03/08/2020   Mixed stress and urge urinary incontinence 12/02/2019   Macrocytosis 12/01/2019   Nutritional anemia 12/01/2019   S/P total knee replacement 01/27/2019   Recurrent left knee instability 07/05/2018    Respiratory failure with hypoxia ([place]) 08/30/2017   Hypoxemia    Heart failure with preserved ejection fraction ([place]), Grade 3 diastolic dysfunction 28/00/3491   PAF (paroxysmal atrial  fibrillation) ([place])    Dyspnea 03/19/2017   Encounter for preventive health examination 02/17/2016   Sensorineural hearing loss (SNHL), bilateral 01/26/2016   Hypomagnesemia 04/24/2014   Hemiparesis affecting left side as late effect of cerebrovascular accident ([place]) 04/24/2014   Nontraumatic cerebral hemorrhage ([place]) 04/30/2012   DM (diabetes mellitus) with complications ([place]) 94/70/9628   OSTEOPENIA 01/21/2009   UNSPECIFIED VITAMIN D DEFICIENCY 11/19/2007   HYPERCHOLESTEROLEMIA 10/25/2006   GASTROESOPHAGEAL REFLUX, NO ESOPHAGITIS 10/25/2006   DIVERTICULOSIS OF COLON 10/25/2006   Osteoarthritis 10/25/2006   CERVICAL SPINE DISORDER, NOS 10/25/2006    Sumner Boast, PT 02/16/2022, 11:41 AM  [place]. [place], [place], [address] Phone: [phone]   Fax:  [phone]  Name: Karen Jackson MRN: [id number] Date of Birth: July 29, 1941

## 2022-02-22 ENCOUNTER — Ambulatory Visit: Payer: Medicare Other | Admitting: Cardiology

## 2022-02-22 DIAGNOSIS — L03116 Cellulitis of left lower limb: Secondary | ICD-10-CM | POA: Diagnosis not present

## 2022-02-22 NOTE — Progress Notes (Deleted)
Cardiology Office Note:    Date:  02/22/2022   ID:  Karen Jackson, DOB April 15, 1941, MRN [id number]  PCP:  Karen Resides, MD  Cardiologist:  Karen Heinz, MD  Electrophysiologist:  Karen Epley, MD   Referring MD: Karen Resides, MD   No chief complaint on file.   History of Present Illness:    Karen Jackson is a 81 y.o. female with a hx of hemorrhagic CVA in 2013, paroxysmal atrial fibrillation, chronic diastolic heart failure, hypertension, hyperlipidemia who presents for follow-up.  She was initially seen 07/26/2021.  She follows with Dr. Quentin Jackson in EP for her A. fib.  Was seen on 07/05/2021 and started on Eliquis.  Noted to be warm and wet on exam, Lasix was increased to 80 mg twice daily x3 days then resume Lasix 80 mg daily.  Echo 06/03/2021 showed EF 50 to 55%, normal RV function, mild biatrial enlargement, mild MR, mild to moderate TR. Calcium score 2093 (97th percentile) on 05/25/2021.  She denies any bleeding issues and starting Eliquis.  Does report she has been feeling short of breath.  Denies any chest pain, lightheadedness, or syncope.  Does report lower extremity edema.  Reports feels short of breath with minimal exertion.  Quit smoking 40 years ago.  Reports no history of heart disease in her immediate family.  Lexiscan Myoview 08/09/2021 showed normal perfusion, EF 58%.  Zio patch x7 days on 08/11/2021 showed 100% A-fib burden, average rate 84 bpm and frequent PVCs (9.4% of beats).  Since last clinic visit, ***RHC   Past Medical History:  Diagnosis Date   Acute cystitis without hematuria    Acute diastolic CHF (congestive heart failure) (HCC)    Arthritis    Dyspnea    Dysrhythmia    Fever of unknown origin 03/19/2017   Hyperlipidemia    Hypertension    denies at preop   Multifocal pneumonia    Neuromuscular disorder (HCC)    neuropathy left arm and foot   Osteopenia    Paralysis (HCC)    partial left side from CVA    Persistent atrial  fibrillation (HCC)    PONV (postoperative nausea and vomiting)    Pre-diabetes    Stroke [facility]) 2013   hemmorahgic    Past Surgical History:  Procedure Laterality Date   ANKLE SURGERY     APPENDECTOMY     CHOLECYSTECTOMY     HERNIA REPAIR     Esophagus   JOINT REPLACEMENT     total- right partial- left   MASTECTOMY PARTIAL / LUMPECTOMY  2012   left   ORIF ANKLE FRACTURE Left 07/20/2018   Procedure: OPEN REDUCTION INTERNAL FIXATION (ORIF) ANKLE FRACTURE;  Surgeon: Wylene Simmer, MD;  Location: [place];  Service: Orthopedics;  Laterality: Left;   TOTAL KNEE ARTHROPLASTY Left 01/27/2019   Procedure: TOTAL KNEE ARTHROPLASTY;  Surgeon: Vickey Huger, MD;  [facility];  Service: Orthopedics;  Laterality: Left;    Current Medications: No outpatient medications have been marked as taking for the 02/23/22 encounter (Appointment) with Karen Heinz, MD.     Allergies:   Codeine phosphate and Simvastatin   Social History   Socioeconomic History   Marital status: Widowed    Spouse name: Not on file   Number of children: 2   Years of education: college   Highest education level: Not on file  Occupational History   Occupation: RetiredGames developer  Tobacco Use   Smoking status: Former  Packs/day: 0.50    Years: 23.00    Total pack years: 11.50    Types: Cigarettes    Start date: 08/28/1957    Quit date: 03/09/1981    Years since quitting: 40.9   Smokeless tobacco: Never  Vaping Use   Vaping Use: Never used  Substance and Sexual Activity   Alcohol use: Yes    Alcohol/week: 16.0 standard drinks of alcohol    Types: 2 Glasses of wine, 14 Standard drinks or equivalent per week    Comment: daily one glass   Drug use: No   Sexual activity: Not Currently  Other Topics Concern   Not on file  Social History Narrative   ** Merged History Encounter ** Health Care POA: son, Karen Jackson, niece & nephew (c) [phone] (h) [phone] of Life Plan:  Who lives with you: selfAny pets: noneDiet: Pt has a varied diet of protein, vegetables and limits "white" foods.Exercise: Pt does water aerobics 3x week, golf 2x weekSeatbelts: Pt reports wearing seatbelt when in vehicle. Sun Exposure/Protection: Pt reports wearing sun screenHobbies: golfing, swimming    Lives alone (has caregivers coming in)   R handed    Social Determinants of Health   Financial Resource Strain: Not on file  Food Insecurity: Not on file  Transportation Needs: Not on file  Physical Activity: Not on file  Stress: Not on file  Social Connections: Not on file     Family History: The patient's family history includes Dementia in her father; Diabetes in her mother; Transient ischemic attack in her father.  ROS:   Please see the history of present illness.     All other systems reviewed and are negative.  EKGs/Labs/Other Studies Reviewed:    The following studies were reviewed today:   EKG:  EKG is not ordered today  Recent Labs: 07/26/2021: ALT 15; BNP 118.8; Hemoglobin 13.4; Platelets 308; TSH 1.460 10/27/2021: BUN 15; Creatinine, Ser 0.57; Potassium 4.4; Sodium 135  Recent Lipid Panel    Component Value Date/Time   CHOL 183 07/26/2021 1648   TRIG 198 (H) 07/26/2021 1648   HDL 63 07/26/2021 1648   CHOLHDL 2.9 07/26/2021 1648   CHOLHDL 2.5 06/17/2015 1057   VLDL 23 06/17/2015 1057   LDLCALC 87 07/26/2021 1648   LDLDIRECT 107 (H) 04/24/2014 1118    Physical Exam:    VS:  There were no vitals taken for this visit.    Wt Readings from Last 3 Encounters:  01/16/22 151 lb 3.2 oz (68.6 kg)  11/10/21 166 lb (75.3 kg)  11/09/21 167 lb (75.8 kg)     GEN:  Well nourished, well developed in no acute distress HEENT: Normal NECK: No JVD; No carotid bruits LYMPHATICS: No lymphadenopathy CARDIAC: irregular, normal rate, no murmurs, rubs, gallops RESPIRATORY:  Clear to auscultation without rales, wheezing or rhonchi  ABDOMEN: Soft, non-tender,  non-distended MUSCULOSKELETAL:  No edema; No deformity  SKIN: Warm and dry NEUROLOGIC:  Alert and oriented x 3 PSYCHIATRIC:  Normal affect   ASSESSMENT:    No diagnosis found.  PLAN:     CAD:  Calcium score 2093 (97th percentile) on 05/25/2021.  She is reporting dyspnea with minimal exertion, which could represent anginal equivalent.  Lexiscan Myoview 08/09/2021 showed normal perfusion, EF 58%.   -Continue Eliquis -Continue atorvastatin  Chronic diastolic heart failure: Echo 06/03/2021 showed EF 50 to 55%, normal RV function, mild biatrial enlargement, mild MR, mild to moderate TR, indeterminate diastolic function.  Appears euvolemic -  Continue Lasix 80 mg daily.  Check BMP, BNP  Dyspnea: She reports dyspnea with minimal exertion.  Follows with pulmonology, recommending right heart cath.  Persistent atrial fibrillation: Follows with EP.  History of hemorrhagic stroke in 2013.  Has been off anticoagulation, Dr. Quentin Jackson discussed with Dr. Leonie Man in neurology and agreed to rechallenge with Eliquis.  Started on Eliquis 5 mg twice daily.  On metoprolol 25 mg twice daily for rate control.  Zio patch x7 days on 08/11/2021 showed 100% A-fib burden, average rate 84 bpm and frequent PVCs (9.4% of beats). -Continue Eliquis 5 mg twice daily -Continue metoprolol  Hemorrhagic CVA: In 2013.  Monitor with restarting Eliquis as above.  Hypertension: On metoprolol 25 mg twice daily, Lasix 80 mg daily.  Appears controlled  Hyperlipidemia: LDL 8711 2922, atorvastatin increased to 40 mg daily.  We will check lipid   RTC in 3 months      Medication Adjustments/Labs and Tests Ordered: Current medicines are reviewed at length with the patient today.  Concerns regarding medicines are outlined above.  No orders of the defined types were placed in this encounter.  No orders of the defined types were placed in this encounter.    There are no Patient Instructions on file for this visit.    Signed, Karen Heinz, MD  02/22/2022 10:38 PM    [place]

## 2022-02-23 ENCOUNTER — Ambulatory Visit (HOSPITAL_BASED_OUTPATIENT_CLINIC_OR_DEPARTMENT_OTHER): Payer: Medicare Other | Admitting: Cardiology

## 2022-02-23 ENCOUNTER — Encounter (HOSPITAL_BASED_OUTPATIENT_CLINIC_OR_DEPARTMENT_OTHER): Payer: Self-pay

## 2022-02-23 NOTE — Telephone Encounter (Signed)
Patient no showed for her appointment with Dr. Gardiner Rhyme.  Will attempt to reschedule.

## 2022-02-27 DIAGNOSIS — M25572 Pain in left ankle and joints of left foot: Secondary | ICD-10-CM | POA: Diagnosis not present

## 2022-02-27 DIAGNOSIS — L03116 Cellulitis of left lower limb: Secondary | ICD-10-CM | POA: Diagnosis not present

## 2022-02-27 DIAGNOSIS — T8484XA Pain due to internal orthopedic prosthetic devices, implants and grafts, initial encounter: Secondary | ICD-10-CM | POA: Diagnosis not present

## 2022-03-08 ENCOUNTER — Ambulatory Visit: Payer: Medicare Other | Admitting: Podiatry

## 2022-03-10 DIAGNOSIS — T8484XA Pain due to internal orthopedic prosthetic devices, implants and grafts, initial encounter: Secondary | ICD-10-CM | POA: Diagnosis not present

## 2022-03-10 DIAGNOSIS — M25572 Pain in left ankle and joints of left foot: Secondary | ICD-10-CM | POA: Diagnosis not present

## 2022-03-14 DIAGNOSIS — I4821 Permanent atrial fibrillation: Secondary | ICD-10-CM | POA: Diagnosis not present

## 2022-03-14 DIAGNOSIS — E785 Hyperlipidemia, unspecified: Secondary | ICD-10-CM | POA: Diagnosis not present

## 2022-03-14 DIAGNOSIS — I272 Pulmonary hypertension, unspecified: Secondary | ICD-10-CM | POA: Diagnosis not present

## 2022-03-14 DIAGNOSIS — D649 Anemia, unspecified: Secondary | ICD-10-CM | POA: Diagnosis not present

## 2022-03-14 DIAGNOSIS — E1159 Type 2 diabetes mellitus with other circulatory complications: Secondary | ICD-10-CM | POA: Diagnosis not present

## 2022-03-14 DIAGNOSIS — R0602 Shortness of breath: Secondary | ICD-10-CM | POA: Diagnosis not present

## 2022-03-14 DIAGNOSIS — I10 Essential (primary) hypertension: Secondary | ICD-10-CM | POA: Diagnosis not present

## 2022-03-14 DIAGNOSIS — I6389 Other cerebral infarction: Secondary | ICD-10-CM | POA: Diagnosis not present

## 2022-03-14 DIAGNOSIS — M1990 Unspecified osteoarthritis, unspecified site: Secondary | ICD-10-CM | POA: Diagnosis not present

## 2022-03-14 DIAGNOSIS — L0390 Cellulitis, unspecified: Secondary | ICD-10-CM | POA: Diagnosis not present

## 2022-03-14 DIAGNOSIS — I69354 Hemiplegia and hemiparesis following cerebral infarction affecting left non-dominant side: Secondary | ICD-10-CM | POA: Diagnosis not present

## 2022-03-20 ENCOUNTER — Ambulatory Visit: Payer: Medicare Other | Admitting: Podiatry

## 2022-03-24 DIAGNOSIS — M25572 Pain in left ankle and joints of left foot: Secondary | ICD-10-CM | POA: Diagnosis not present

## 2022-04-07 DIAGNOSIS — D509 Iron deficiency anemia, unspecified: Secondary | ICD-10-CM | POA: Diagnosis not present

## 2022-04-14 DIAGNOSIS — T8484XA Pain due to internal orthopedic prosthetic devices, implants and grafts, initial encounter: Secondary | ICD-10-CM | POA: Diagnosis not present

## 2022-04-18 ENCOUNTER — Telehealth: Payer: Self-pay | Admitting: Family Medicine

## 2022-04-18 NOTE — Telephone Encounter (Signed)
Patient called asking about bill.  Has been decreased from $350 to $205.  Asks if this is the best filing of her office visit.  I will check with office.

## 2022-04-26 NOTE — Progress Notes (Unsigned)
Cardiology Office Note:    Date:  04/27/2022   ID:  Karen Jackson, DOB 1941/03/12, MRN [id number]  PCP:  Zenia Resides, MD  Cardiologist:  Donato Heinz, MD  Electrophysiologist:  Vickie Epley, MD   Referring MD: Zenia Resides, MD   Chief Complaint  Patient presents with   Coronary Artery Disease    History of Present Illness:    Karen Jackson is a 81 y.o. female with a hx of hemorrhagic CVA in 2013, paroxysmal atrial fibrillation, chronic diastolic heart failure, hypertension, hyperlipidemia who presents for follow-up.  She was seen for an initial visit for diastolic heart failure on 07/26/2021.  She follows with Dr. Quentin Ore in EP for her A. fib.  Was seen on 07/05/2021 and started on Eliquis.  Noted to be warm and wet on exam, Lasix was increased to 80 mg twice daily x3 days then resume Lasix 80 mg daily.  Echo 06/03/2021 showed EF 50 to 55%, normal RV function, mild biatrial enlargement, mild MR, mild to moderate TR. Calcium score 2093 (97th percentile) on 05/25/2021.  Lexiscan Myoview on 08/09/2021 showed normal perfusion, EF 58%.  Zio patch x7 days on 08/11/2021 showed 100% A-fib burden with average rate 84 bpm, frequent PVCs (9.4% of beats)  Since last clinic visit, reports she has not been walking due to foot injury.  Has ulcer on foot and states that she still has an open wound.  Her dyspnea is stable but less of an issue since she has not been walking.  She denies any chest pain, lightheadedness, syncope, or palpitations.  She started taking Ozempic and has lost 20 pounds.  She is taking Lasix 80 mg daily.  Taking Eliquis, denies any bleeding but reports had recent FOBT that was positive.  She is planning to see GI.   Wt Readings from Last 3 Encounters:  04/27/22 150 lb (68 kg)  01/16/22 151 lb 3.2 oz (68.6 kg)  11/10/21 166 lb (75.3 kg)       Past Medical History:  Diagnosis Date   Acute cystitis without hematuria    Acute diastolic CHF (congestive heart  failure) (HCC)    Arthritis    Dyspnea    Dysrhythmia    Fever of unknown origin 03/19/2017   Hyperlipidemia    Hypertension    denies at preop   Multifocal pneumonia    Neuromuscular disorder (HCC)    neuropathy left arm and foot   Osteopenia    Paralysis (HCC)    partial left side from CVA    Persistent atrial fibrillation (HCC)    PONV (postoperative nausea and vomiting)    Pre-diabetes    Stroke [facility]) 2013   hemmorahgic    Past Surgical History:  Procedure Laterality Date   ANKLE SURGERY     APPENDECTOMY     CHOLECYSTECTOMY     HERNIA REPAIR     Esophagus   JOINT REPLACEMENT     total- right partial- left   MASTECTOMY PARTIAL / LUMPECTOMY  2012   left   ORIF ANKLE FRACTURE Left 07/20/2018   Procedure: OPEN REDUCTION INTERNAL FIXATION (ORIF) ANKLE FRACTURE;  Surgeon: Wylene Simmer, MD;  Location: [facility];  Service: Orthopedics;  Laterality: Left;   TOTAL KNEE ARTHROPLASTY Left 01/27/2019   Procedure: TOTAL KNEE ARTHROPLASTY;  Surgeon: Vickey Huger, MD;  Location: [facility];  Service: Orthopedics;  Laterality: Left;    Current Medications: Current Meds  Medication Sig   apixaban (ELIQUIS) 5 MG TABS  tablet Take 1 tablet (5 mg total) by mouth 2 (two) times daily.   atorvastatin (LIPITOR) 40 MG tablet Take 1 tablet (40 mg total) by mouth daily.   Calcium Carb-Cholecalciferol (CALCIUM CARBONATE-VITAMIN D3) 600-400 MG-UNIT TABS Take 1 tablet by mouth daily.   celecoxib (CELEBREX) 200 MG capsule TAKE 1 CAPSULE BY MOUTH DAILY AS NEEDED FOR ARTHRITIS OR PAIN   diclofenac Sodium (VOLTAREN) 1 % GEL APPLY 2 GRAMS EXTERNALLY TO THE AFFECTED AREA FOUR TIMES DAILY   ergocalciferol (VITAMIN D2) 1.25 MG (50000 UT) capsule 50,000 unit   furosemide (LASIX) 80 MG tablet TAKE 1 TABLET BY MOUTH  DAILY   gabapentin (NEURONTIN) 100 MG capsule TAKE 1 CAPSULE BY MOUTH 3  TIMES DAILY   hydrocortisone 2.5 % cream Apply topically 2 (two) times daily as needed.   lidocaine (LIDODERM) 5 % PLACE 1 PATCH  ONTO THE SKIN DAILY. REMOVE AND DISCORD PATCH WITHIN 12 HOURS OR AS DIRECTED BY MD   MAGNESIUM-OXIDE 400 (240 Mg) MG tablet TAKE 1 TABLET BY MOUTH DAILY   metoprolol succinate (TOPROL-XL) 50 MG 24 hr tablet Take 1 tablet (50 mg total) by mouth daily. Take with or immediately following a meal.   omeprazole (PRILOSEC) 20 MG capsule TAKE 1 CAPSULE BY MOUTH  DAILY   potassium chloride (KLOR-CON M) 10 MEQ tablet Take 1 tablet (10 mEq total) by mouth 2 (two) times daily.   Semaglutide, 1 MG/DOSE, (OZEMPIC, 1 MG/DOSE,) 4 MG/3ML SOPN Inject 1 mg into the skin once a week.   traZODone (DESYREL) 50 MG tablet TAKE 1 TABLET BY MOUTH AT  BEDTIME   TURMERIC PO Take 1 capsule by mouth daily.     Allergies:   Codeine phosphate and Simvastatin   Social History   Socioeconomic History   Marital status: Widowed    Spouse name: Not on file   Number of children: 2   Years of education: college   Highest education level: Not on file  Occupational History   Occupation: RetiredGames developer  Tobacco Use   Smoking status: Former    Packs/day: 0.50    Years: 23.00    Total pack years: 11.50    Types: Cigarettes    Start date: 08/28/1957    Quit date: 03/09/1981    Years since quitting: 41.1   Smokeless tobacco: Never  Vaping Use   Vaping Use: Never used  Substance and Sexual Activity   Alcohol use: Yes    Alcohol/week: 16.0 standard drinks of alcohol    Types: 2 Glasses of wine, 14 Standard drinks or equivalent per week    Comment: daily one glass   Drug use: No   Sexual activity: Not Currently  Other Topics Concern   Not on file  Social History Narrative   ** Merged History Encounter ** Health Care POA: son, Charly RayEmergency Contact: Ann and Roosvelt Harps, niece & nephew (c) [phone] (h) [phone] of Life Plan: Who lives with you: selfAny pets: noneDiet: Pt has a varied diet of protein, vegetables and limits "white" foods.Exercise: Pt does water aerobics 3x week, golf 2x weekSeatbelts: Pt reports  wearing seatbelt when in vehicle. Sun Exposure/Protection: Pt reports wearing sun screenHobbies: golfing, swimming    Lives alone (has caregivers coming in)   R handed    Social Determinants of Health   Financial Resource Strain: Not on file  Food Insecurity: Not on file  Transportation Needs: Not on file  Physical Activity: Not on file  Stress: Not on file  Social Connections:  Not on file     Family History: The patient's family history includes Dementia in her father; Diabetes in her mother; Transient ischemic attack in her father.  ROS:   Please see the history of present illness.     All other systems reviewed and are negative.  EKGs/Labs/Other Studies Reviewed:    The following studies were reviewed today:   EKG:   04/27/2022: Atrial fibrillation, rate 76  Recent Labs: 07/26/2021: ALT 15; BNP 118.8; Hemoglobin 13.4; Platelets 308; TSH 1.460 10/27/2021: BUN 15; Creatinine, Ser 0.57; Potassium 4.4; Sodium 135  Recent Lipid Panel    Component Value Date/Time   CHOL 183 07/26/2021 1648   TRIG 198 (H) 07/26/2021 1648   HDL 63 07/26/2021 1648   CHOLHDL 2.9 07/26/2021 1648   CHOLHDL 2.5 06/17/2015 1057   VLDL 23 06/17/2015 1057   LDLCALC 87 07/26/2021 1648   LDLDIRECT 107 (H) 04/24/2014 1118    Physical Exam:    VS:  BP 115/60   Pulse 76   Ht '4\' 11"'$  (1.499 m)   Wt 150 lb (68 kg)   SpO2 95%   BMI 30.30 kg/m     Wt Readings from Last 3 Encounters:  04/27/22 150 lb (68 kg)  01/16/22 151 lb 3.2 oz (68.6 kg)  11/10/21 166 lb (75.3 kg)     GEN:  Well nourished, well developed in no acute distress HEENT: Normal NECK: No JVD; No carotid bruits LYMPHATICS: No lymphadenopathy CARDIAC: irregular, normal rate, no murmurs, rubs, gallops RESPIRATORY:  Clear to auscultation without rales, wheezing or rhonchi  ABDOMEN: Soft, non-tender, non-distended MUSCULOSKELETAL:  No edema; No deformity  SKIN: Warm and dry NEUROLOGIC:  Alert and oriented x 3 PSYCHIATRIC:  Normal  affect   ASSESSMENT:    1. Shortness of breath   2. CAD in native artery   3. Chronic diastolic heart failure (HCC)   4. Persistent atrial fibrillation ([place])   5. Hyperlipidemia, unspecified hyperlipidemia type   6. Pulmonary hypertension (HCC)     PLAN:     Dyspnea: She reports dyspnea with minimal exertion.  Likely multifactorial with chronic diastolic heart failure and possible restrictive lung disease contributing.  Dilated pulmonary artery on CT suggesting pulmonary hypertension.  Mildly elevated pulmonary pressures on echo 05/2021. -Follows with Dr. Erin Fulling in pulmonology, recommended RHC/LHC for further evaluation of suspected pulmonary hypertension.  Had planned to schedule at this clinic visit but she currently has an open wound in her leg for which she is being treated, reports has been on abx.  Recommend treating her leg wound and can revisit RHC/LHC once she recovers.  Will start with repeating echo to evaluate her pulmonary pressures  CAD:  Calcium score 2093 (97th percentile) on 05/25/2021.  Reported dyspnea on exertion.  Lexiscan Myoview on 08/09/2021 showed normal perfusion, EF 58%.   -Continue Eliquis -Continue atorvastatin  Chronic diastolic heart failure: Echo 06/03/2021 showed EF 50 to 55%, normal RV function, mild biatrial enlargement, mild MR, mild to moderate TR, indeterminate diastolic function.  Appears euvolemic -Continue Lasix 80 mg daily.  Check CMET, magnesium  Persistent atrial fibrillation: Follows with EP.  History of hemorrhagic stroke in 2013.  Has been off anticoagulation, Dr. Quentin Ore discussed with Dr. Leonie Man in neurology and agreed to rechallenge with Eliquis.  Started on Eliquis 5 mg twice daily.  On metoprolol 25 mg twice daily for rate control. Zio patch x7 days on 08/11/2021 showed 100% A-fib burden with average rate 84 bpm, frequent PVCs (9.4% of beats) -Continue  Eliquis 5 mg twice daily.  She denies any recent bleeding but reports had recent FOBT that  was positive and has been referred to GI.  Will check CBC -Continue Toprol-XL 50 mg daily  Frequent PVCs: 9.4% of beats on Zio patch 08/11/2021 -Continue Toprol-XL -Follows with Dr. Quentin Ore in EP  Hemorrhagic CVA: In 2013.  Has had no issues with starting Eliquis in 2022  Hypertension: On metoprolol 25 mg twice daily, Lasix 80 mg daily.  Appears controlled  Hyperlipidemia: On atorvastatin 20 mg daily.  LDL 87 on 07/26/2021.  Atorvastatin dose increased to 40 mg daily, will check lipid panel   RTC in 3 months    Medication Adjustments/Labs and Tests Ordered: Current medicines are reviewed at length with the patient today.  Concerns regarding medicines are outlined above.  Orders Placed This Encounter  Procedures   Comprehensive metabolic panel   CBC   Lipid panel   Magnesium   EKG 12-Lead   ECHOCARDIOGRAM COMPLETE   No orders of the defined types were placed in this encounter.    Patient Instructions  Medication Instructions:  Your physician recommends that you continue on your current medications as directed. Please refer to the Current Medication list given to you today.  *If you need a refill on your cardiac medications before your next appointment, please call your pharmacy*   Lab Work: CMET, CBC, Lipid today  If you have labs (blood work) drawn today and your tests are completely normal, you will receive your results only by: [place] (if you have MyChart) OR A paper copy in the mail If you have any lab test that is abnormal or we need to change your treatment, we will call you to review the results.  Testing: Your physician has requested that you have an echocardiogram. Echocardiography is a painless test that uses sound waves to create images of your heart. It provides your doctor with information about the size and shape of your heart and how well your heart's chambers and valves are working. This procedure takes approximately one hour. There are no  restrictions for this procedure.  Follow-Up: At [facility], you and your health needs are our priority.  As part of our continuing mission to provide you with exceptional heart care, we have created designated Provider Care Teams.  These Care Teams include your primary Cardiologist (physician) and Advanced Practice Providers (APPs -  Physician Assistants and Nurse Practitioners) who all work together to provide you with the care you need, when you need it.  We recommend signing up for the patient portal called "MyChart".  Sign up information is provided on this After Visit Summary.  MyChart is used to connect with patients for Virtual Visits (Telemedicine).  Patients are able to view lab/test results, encounter notes, upcoming appointments, etc.  Non-urgent messages can be sent to your provider as well.   To learn more about what you can do with MyChart, go to [email or web address].    Your next appointment:   3 month(s)  The format for your next appointment:   In Person  Provider:   Donato Heinz, MD      Important Information About Sugar         Signed, Donato Heinz, MD  04/27/2022 2:53 PM    [place]

## 2022-04-27 ENCOUNTER — Encounter: Payer: Self-pay | Admitting: Cardiology

## 2022-04-27 ENCOUNTER — Ambulatory Visit: Payer: Medicare Other | Attending: Cardiology | Admitting: Cardiology

## 2022-04-27 VITALS — BP 115/60 | HR 76 | Ht 59.0 in | Wt 150.0 lb

## 2022-04-27 DIAGNOSIS — E785 Hyperlipidemia, unspecified: Secondary | ICD-10-CM

## 2022-04-27 DIAGNOSIS — I5032 Chronic diastolic (congestive) heart failure: Secondary | ICD-10-CM

## 2022-04-27 DIAGNOSIS — I2510 Atherosclerotic heart disease of native coronary artery without angina pectoris: Secondary | ICD-10-CM | POA: Diagnosis not present

## 2022-04-27 DIAGNOSIS — I4819 Other persistent atrial fibrillation: Secondary | ICD-10-CM | POA: Diagnosis not present

## 2022-04-27 DIAGNOSIS — R0602 Shortness of breath: Secondary | ICD-10-CM | POA: Diagnosis not present

## 2022-04-27 DIAGNOSIS — I272 Pulmonary hypertension, unspecified: Secondary | ICD-10-CM

## 2022-04-27 NOTE — Patient Instructions (Addendum)
Medication Instructions:  Your physician recommends that you continue on your current medications as directed. Please refer to the Current Medication list given to you today.  *If you need a refill on your cardiac medications before your next appointment, please call your pharmacy*   Lab Work: CMET, CBC, Lipid today  If you have labs (blood work) drawn today and your tests are completely normal, you will receive your results only by: [place] (if you have MyChart) OR A paper copy in the mail If you have any lab test that is abnormal or we need to change your treatment, we will call you to review the results.  Testing: Your physician has requested that you have an echocardiogram. Echocardiography is a painless test that uses sound waves to create images of your heart. It provides your doctor with information about the size and shape of your heart and how well your heart's chambers and valves are working. This procedure takes approximately one hour. There are no restrictions for this procedure.  Follow-Up: At [facility], you and your health needs are our priority.  As part of our continuing mission to provide you with exceptional heart care, we have created designated Provider Care Teams.  These Care Teams include your primary Cardiologist (physician) and Advanced Practice Providers (APPs -  Physician Assistants and Nurse Practitioners) who all work together to provide you with the care you need, when you need it.  We recommend signing up for the patient portal called "MyChart".  Sign up information is provided on this After Visit Summary.  MyChart is used to connect with patients for Virtual Visits (Telemedicine).  Patients are able to view lab/test results, encounter notes, upcoming appointments, etc.  Non-urgent messages can be sent to your provider as well.   To learn more about what you can do with MyChart, go to [email or web address].    Your next appointment:   3  month(s)  The format for your next appointment:   In Person  Provider:   Donato Heinz, MD      Important Information About Sugar

## 2022-04-28 LAB — COMPREHENSIVE METABOLIC PANEL
ALT: 20 IU/L (ref 0–32)
AST: 22 IU/L (ref 0–40)
Albumin/Globulin Ratio: 1.8 (ref 1.2–2.2)
Albumin: 4.2 g/dL (ref 3.7–4.7)
Alkaline Phosphatase: 79 IU/L (ref 44–121)
BUN/Creatinine Ratio: 29 — ABNORMAL HIGH (ref 12–28)
BUN: 15 mg/dL (ref 8–27)
Bilirubin Total: 0.5 mg/dL (ref 0.0–1.2)
CO2: 31 mmol/L — ABNORMAL HIGH (ref 20–29)
Calcium: 9.9 mg/dL (ref 8.7–10.3)
Chloride: 90 mmol/L — ABNORMAL LOW (ref 96–106)
Creatinine, Ser: 0.52 mg/dL — ABNORMAL LOW (ref 0.57–1.00)
Globulin, Total: 2.3 g/dL (ref 1.5–4.5)
Glucose: 141 mg/dL — ABNORMAL HIGH (ref 70–99)
Potassium: 4.2 mmol/L (ref 3.5–5.2)
Sodium: 138 mmol/L (ref 134–144)
Total Protein: 6.5 g/dL (ref 6.0–8.5)
eGFR: 93 mL/min/{1.73_m2} (ref >59)

## 2022-04-28 LAB — CBC
Hematocrit: 39.4 % (ref 34.0–46.6)
Hemoglobin: 12.8 g/dL (ref 11.1–15.9)
MCH: 28.6 pg (ref 26.6–33.0)
MCHC: 32.5 g/dL (ref 31.5–35.7)
MCV: 88 fL (ref 79–97)
Platelets: 351 10*3/uL (ref 150–450)
RBC: 4.47 x10E6/uL (ref 3.77–5.28)
RDW: 19.9 % — ABNORMAL HIGH (ref 11.7–15.4)
WBC: 7.3 10*3/uL (ref 3.4–10.8)

## 2022-04-28 LAB — MAGNESIUM: Magnesium: 2.1 mg/dL (ref 1.6–2.3)

## 2022-04-28 LAB — LIPID PANEL
Chol/HDL Ratio: 2.5 ratio (ref 0.0–4.4)
Cholesterol, Total: 164 mg/dL (ref 100–199)
HDL: 65 mg/dL (ref >39)
LDL Chol Calc (NIH): 61 mg/dL (ref 0–99)
Triglycerides: 239 mg/dL — ABNORMAL HIGH (ref 0–149)
VLDL Cholesterol Cal: 38 mg/dL (ref 5–40)

## 2022-05-02 ENCOUNTER — Encounter: Payer: Self-pay | Admitting: *Deleted

## 2022-05-03 DIAGNOSIS — D649 Anemia, unspecified: Secondary | ICD-10-CM | POA: Diagnosis not present

## 2022-05-03 DIAGNOSIS — I10 Essential (primary) hypertension: Secondary | ICD-10-CM | POA: Diagnosis not present

## 2022-05-03 DIAGNOSIS — R7989 Other specified abnormal findings of blood chemistry: Secondary | ICD-10-CM | POA: Diagnosis not present

## 2022-05-08 ENCOUNTER — Other Ambulatory Visit: Payer: Self-pay | Admitting: Family Medicine

## 2022-05-09 ENCOUNTER — Ambulatory Visit (HOSPITAL_COMMUNITY): Payer: Medicare Other | Attending: Cardiology

## 2022-05-09 DIAGNOSIS — I272 Pulmonary hypertension, unspecified: Secondary | ICD-10-CM

## 2022-05-09 LAB — ECHOCARDIOGRAM COMPLETE
Area-P 1/2: 2.76 cm2
Calc EF: 46.5 %
MV M vel: 4.23 m/s
MV Peak grad: 71.6 mmHg
S' Lateral: 3.1 cm
Single Plane A2C EF: 48.8 %
Single Plane A4C EF: 45.1 %

## 2022-05-18 ENCOUNTER — Telehealth: Payer: Self-pay

## 2022-05-18 NOTE — Telephone Encounter (Signed)
Left message informing patient her novo nordisk order is ready for pickup.  4 boxes Ozempic ('1mg'$  dose) is labeled and ready in med room fridge

## 2022-05-19 ENCOUNTER — Ambulatory Visit: Payer: Medicare Other | Admitting: Podiatry

## 2022-05-19 NOTE — Telephone Encounter (Signed)
Patient's caregiver presents to clinic to pick up medication. Provided with medication per note from [place].   Talbot Grumbling, RN

## 2022-05-25 DIAGNOSIS — Z23 Encounter for immunization: Secondary | ICD-10-CM | POA: Diagnosis not present

## 2022-05-25 DIAGNOSIS — S91002A Unspecified open wound, left ankle, initial encounter: Secondary | ICD-10-CM | POA: Diagnosis not present

## 2022-05-25 DIAGNOSIS — I69354 Hemiplegia and hemiparesis following cerebral infarction affecting left non-dominant side: Secondary | ICD-10-CM | POA: Diagnosis not present

## 2022-05-25 DIAGNOSIS — L89523 Pressure ulcer of left ankle, stage 3: Secondary | ICD-10-CM | POA: Diagnosis not present

## 2022-06-09 DIAGNOSIS — Z23 Encounter for immunization: Secondary | ICD-10-CM | POA: Diagnosis not present

## 2022-06-20 DIAGNOSIS — Z23 Encounter for immunization: Secondary | ICD-10-CM | POA: Diagnosis not present

## 2022-07-06 DIAGNOSIS — D649 Anemia, unspecified: Secondary | ICD-10-CM | POA: Diagnosis not present

## 2022-07-07 DIAGNOSIS — E785 Hyperlipidemia, unspecified: Secondary | ICD-10-CM | POA: Diagnosis not present

## 2022-07-07 DIAGNOSIS — I69354 Hemiplegia and hemiparesis following cerebral infarction affecting left non-dominant side: Secondary | ICD-10-CM | POA: Diagnosis not present

## 2022-07-07 DIAGNOSIS — I272 Pulmonary hypertension, unspecified: Secondary | ICD-10-CM | POA: Diagnosis not present

## 2022-07-07 DIAGNOSIS — L89523 Pressure ulcer of left ankle, stage 3: Secondary | ICD-10-CM | POA: Diagnosis not present

## 2022-07-07 DIAGNOSIS — E1159 Type 2 diabetes mellitus with other circulatory complications: Secondary | ICD-10-CM | POA: Diagnosis not present

## 2022-07-07 DIAGNOSIS — I10 Essential (primary) hypertension: Secondary | ICD-10-CM | POA: Diagnosis not present

## 2022-07-07 DIAGNOSIS — I4821 Permanent atrial fibrillation: Secondary | ICD-10-CM | POA: Diagnosis not present

## 2022-07-07 DIAGNOSIS — D649 Anemia, unspecified: Secondary | ICD-10-CM | POA: Diagnosis not present

## 2022-07-07 DIAGNOSIS — I6389 Other cerebral infarction: Secondary | ICD-10-CM | POA: Diagnosis not present

## 2022-07-07 DIAGNOSIS — M1990 Unspecified osteoarthritis, unspecified site: Secondary | ICD-10-CM | POA: Diagnosis not present

## 2022-07-27 ENCOUNTER — Ambulatory Visit: Payer: Medicare Other | Attending: Cardiology | Admitting: Cardiology

## 2022-07-27 ENCOUNTER — Encounter: Payer: Self-pay | Admitting: Cardiology

## 2022-07-27 VITALS — BP 126/64 | HR 81 | Ht <= 58 in | Wt 155.0 lb

## 2022-07-27 DIAGNOSIS — I5032 Chronic diastolic (congestive) heart failure: Secondary | ICD-10-CM | POA: Diagnosis not present

## 2022-07-27 DIAGNOSIS — E785 Hyperlipidemia, unspecified: Secondary | ICD-10-CM | POA: Diagnosis not present

## 2022-07-27 DIAGNOSIS — I272 Pulmonary hypertension, unspecified: Secondary | ICD-10-CM | POA: Diagnosis not present

## 2022-07-27 DIAGNOSIS — I4819 Other persistent atrial fibrillation: Secondary | ICD-10-CM | POA: Diagnosis not present

## 2022-07-27 DIAGNOSIS — R0602 Shortness of breath: Secondary | ICD-10-CM | POA: Diagnosis not present

## 2022-07-27 DIAGNOSIS — I4891 Unspecified atrial fibrillation: Secondary | ICD-10-CM | POA: Diagnosis not present

## 2022-07-27 DIAGNOSIS — I2510 Atherosclerotic heart disease of native coronary artery without angina pectoris: Secondary | ICD-10-CM | POA: Insufficient documentation

## 2022-07-27 NOTE — Patient Instructions (Signed)
Medication Instructions:  Your physician recommends that you continue on your current medications as directed. Please refer to the Current Medication list given to you today.  *If you need a refill on your cardiac medications before your next appointment, please call your pharmacy*  Lab Work: BMET, [place] today  If you have labs (blood work) drawn today and your tests are completely normal, you will receive your results only by: [place] (if you have MyChart) OR A paper copy in the mail If you have any lab test that is abnormal or we need to change your treatment, we will call you to review the results.  Follow-Up: At [facility], you and your health needs are our priority.  As part of our continuing mission to provide you with exceptional heart care, we have created designated Provider Care Teams.  These Care Teams include your primary Cardiologist (physician) and Advanced Practice Providers (APPs -  Physician Assistants and Nurse Practitioners) who all work together to provide you with the care you need, when you need it.  We recommend signing up for the patient portal called "MyChart".  Sign up information is provided on this After Visit Summary.  MyChart is used to connect with patients for Virtual Visits (Telemedicine).  Patients are able to view lab/test results, encounter notes, upcoming appointments, etc.  Non-urgent messages can be sent to your provider as well.   To learn more about what you can do with MyChart, go to [email or web address].    Your next appointment:   6 month(s)  The format for your next appointment:   In Person  Provider:   Donato Heinz, MD

## 2022-07-27 NOTE — Progress Notes (Signed)
Cardiology Office Note:    Date:  07/27/2022   ID:  Karen Jackson, DOB [birth year], MRN [id number]  PCP:  Crist Infante, MD  Cardiologist:  Donato Heinz, MD  Electrophysiologist:  Vickie Epley, MD   Referring MD: No ref. provider found   No chief complaint on file.   History of Present Illness:    Karen Jackson is a 81 y.o. female with a hx of hemorrhagic CVA in 2013, paroxysmal atrial fibrillation, chronic diastolic heart failure, hypertension, hyperlipidemia who presents for follow-up.  She was seen for an initial visit for diastolic heart failure on 07/26/2021.  She follows with Dr. Quentin Ore in EP for her A. fib.  Was seen on 07/05/2021 and started on Eliquis.  Noted to be warm and wet on exam, Lasix was increased to 80 mg twice daily x3 days then resume Lasix 80 mg daily.  Echo 06/03/2021 showed EF 50 to 55%, normal RV function, mild biatrial enlargement, mild MR, mild to moderate TR. Calcium score 2093 (97th percentile) on 05/25/2021.  Lexiscan Myoview on 08/09/2021 showed normal perfusion, EF 58%.  Zio patch x7 days on 08/11/2021 showed 100% A-fib burden with average rate 84 bpm, frequent PVCs (9.4% of beats).  Echocardiogram 05/09/22 showed EF 55-60%, normal RV function/mild enlargement, mildy elevated pulmonary pressures (RVSP 4mHg), moderate LAE,no significant valvular disease.  Since last clinic visit, she reports that she has been doing okay.  Continues to have shortness of breath.  Denies any chest pain.  Taking Eliquis, denies any bleeding issues.  Reports Lasix has been keeping the fluid off.   Wt Readings from Last 3 Encounters:  07/27/22 155 lb (70.3 kg)  04/27/22 150 lb (68 kg)  01/16/22 151 lb 3.2 oz (68.6 kg)       Past Medical History:  Diagnosis Date   Acute cystitis without hematuria    Acute diastolic CHF (congestive heart failure) (HCC)    Arthritis    Dyspnea    Dysrhythmia    Fever of unknown origin 03/19/2017   Hyperlipidemia    Hypertension     denies at preop   Multifocal pneumonia    Neuromuscular disorder (HCC)    neuropathy left arm and foot   Osteopenia    Paralysis (HCC)    partial left side from CVA    Persistent atrial fibrillation (HCC)    PONV (postoperative nausea and vomiting)    Pre-diabetes    Stroke ([facility] 2013   hemmorahgic    Past Surgical History:  Procedure Laterality Date   ANKLE SURGERY     APPENDECTOMY     CHOLECYSTECTOMY     HERNIA REPAIR     Esophagus   JOINT REPLACEMENT     total- right partial- left   MASTECTOMY PARTIAL / LUMPECTOMY  2012   left   ORIF ANKLE FRACTURE Left 07/20/2018   Procedure: OPEN REDUCTION INTERNAL FIXATION (ORIF) ANKLE FRACTURE;  Surgeon: HWylene Simmer MD;  Location: [facility]  Service: Orthopedics;  Laterality: Left;   TOTAL KNEE ARTHROPLASTY Left 01/27/2019   Procedure: TOTAL KNEE ARTHROPLASTY;  Surgeon: LVickey Huger MD;  Location: [facility];  Service: Orthopedics;  Laterality: Left;    Current Medications: Current Meds  Medication Sig   apixaban (ELIQUIS) 5 MG TABS tablet Take 1 tablet (5 mg total) by mouth 2 (two) times daily.   atorvastatin (LIPITOR) 40 MG tablet Take 1 tablet (40 mg total) by mouth daily.   Calcium Carb-Cholecalciferol (CALCIUM CARBONATE-VITAMIN D3) 600-400 MG-UNIT TABS Take  1 tablet by mouth daily.   celecoxib (CELEBREX) 200 MG capsule TAKE 1 CAPSULE BY MOUTH DAILY AS NEEDED FOR ARTHRITIS OR PAIN   diclofenac Sodium (VOLTAREN) 1 % GEL APPLY 2 GRAMS EXTERNALLY TO THE AFFECTED AREA FOUR TIMES DAILY   ergocalciferol (VITAMIN D2) 1.25 MG (50000 UT) capsule 50,000 unit   furosemide (LASIX) 80 MG tablet TAKE 1 TABLET BY MOUTH  DAILY   gabapentin (NEURONTIN) 100 MG capsule TAKE 1 CAPSULE BY MOUTH 3  TIMES DAILY   hydrocortisone 2.5 % cream Apply topically 2 (two) times daily as needed.   lidocaine (LIDODERM) 5 % PLACE 1 PATCH ONTO THE SKIN DAILY. REMOVE AND DISCORD PATCH WITHIN 12 HOURS OR AS DIRECTED BY MD   MAGNESIUM-OXIDE 400 (240 Mg) MG tablet TAKE  1 TABLET BY MOUTH DAILY   metoprolol succinate (TOPROL-XL) 50 MG 24 hr tablet Take 1 tablet (50 mg total) by mouth daily. Take with or immediately following a meal.   omeprazole (PRILOSEC) 20 MG capsule TAKE 1 CAPSULE BY MOUTH  DAILY   potassium chloride (KLOR-CON M) 10 MEQ tablet Take 1 tablet (10 mEq total) by mouth 2 (two) times daily.   Semaglutide, 1 MG/DOSE, (OZEMPIC, 1 MG/DOSE,) 4 MG/3ML SOPN Inject 1 mg into the skin once a week.   traZODone (DESYREL) 50 MG tablet TAKE 1 TABLET BY MOUTH AT  BEDTIME   TURMERIC PO Take 1 capsule by mouth daily.     Allergies:   Codeine phosphate and Simvastatin   Social History   Socioeconomic History   Marital status: Widowed    Spouse name: Not on file   Number of children: 2   Years of education: college   Highest education level: Not on file  Occupational History   Occupation: RetiredGames developer  Tobacco Use   Smoking status: Former    Packs/day: 0.50    Years: 23.00    Total pack years: 11.50    Types: Cigarettes    Start date: 08/28/1957    Quit date: 03/09/1981    Years since quitting: 41.4   Smokeless tobacco: Never  Vaping Use   Vaping Use: Never used  Substance and Sexual Activity   Alcohol use: Yes    Alcohol/week: 16.0 standard drinks of alcohol    Types: 2 Glasses of wine, 14 Standard drinks or equivalent per week    Comment: daily one glass   Drug use: No   Sexual activity: Not Currently  Other Topics Concern   Not on file  Social History Narrative   ** Merged History Encounter ** Health Care POA: son, Charly RayEmergency Contact: Ann and Roosvelt Harps, niece & nephew (c) [phone] (h) [phone] of Life Plan: Who lives with you: selfAny pets: noneDiet: Pt has a varied diet of protein, vegetables and limits "white" foods.Exercise: Pt does water aerobics 3x week, golf 2x weekSeatbelts: Pt reports wearing seatbelt when in vehicle. Sun Exposure/Protection: Pt reports wearing sun screenHobbies: golfing, swimming    Lives alone (has  caregivers coming in)   R handed    Social Determinants of Health   Financial Resource Strain: Not on file  Food Insecurity: Not on file  Transportation Needs: Not on file  Physical Activity: Not on file  Stress: Not on file  Social Connections: Not on file     Family History: The patient's family history includes Dementia in her father; Diabetes in her mother; Transient ischemic attack in her father.  ROS:   Please see the history of present illness.  All other systems reviewed and are negative.  EKGs/Labs/Other Studies Reviewed:    The following studies were reviewed today:   EKG:   04/27/2022: Atrial fibrillation, rate 76  Recent Labs: 04/27/2022: ALT 20; BUN 15; Creatinine, Ser 0.52; Hemoglobin 12.8; Magnesium 2.1; Platelets 351; Potassium 4.2; Sodium 138  Recent Lipid Panel    Component Value Date/Time   CHOL 164 04/27/2022 1557   TRIG 239 (H) 04/27/2022 1557   HDL 65 04/27/2022 1557   CHOLHDL 2.5 04/27/2022 1557   CHOLHDL 2.5 06/17/2015 1057   VLDL 23 06/17/2015 1057   LDLCALC 61 04/27/2022 1557   LDLDIRECT 107 (H) 04/24/2014 1118    Physical Exam:    VS:  BP 126/64 (BP Location: Left Arm, Patient Position: Sitting, Cuff Size: Normal)   Pulse 81   Ht '4\' 10"'$  (1.473 m)   Wt 155 lb (70.3 kg)   SpO2 93%   BMI 32.40 kg/m     Wt Readings from Last 3 Encounters:  07/27/22 155 lb (70.3 kg)  04/27/22 150 lb (68 kg)  01/16/22 151 lb 3.2 oz (68.6 kg)     GEN:  Well nourished, well developed in no acute distress HEENT: Normal NECK: No JVD; No carotid bruits LYMPHATICS: No lymphadenopathy CARDIAC: irregular, normal rate, no murmurs, rubs, gallops RESPIRATORY:  Clear to auscultation without rales, wheezing or rhonchi  ABDOMEN: Soft, non-tender, non-distended MUSCULOSKELETAL:  No edema; No deformity  SKIN: Warm and dry NEUROLOGIC:  Alert and oriented x 3 PSYCHIATRIC:  Normal affect   ASSESSMENT:    1. CAD in native artery   2. Shortness of breath    3. Chronic diastolic heart failure (HCC)   4. Persistent atrial fibrillation ([place])   5. Pulmonary hypertension ([place])   6. Hyperlipidemia, unspecified hyperlipidemia type      PLAN:     Dyspnea: She reports dyspnea with exertion.  Likely multifactorial with chronic diastolic heart failure and possible restrictive lung disease contributing.  Dilated pulmonary artery on CT suggesting pulmonary hypertension.  Echocardiogram 05/09/22 showed EF 55-60%, normal RV function/mild enlargement, mildy elevated pulmonary pressures (RVSP 59mHg), moderate LAE,no significant valvular disease. -Discussed RHC/LHC for further evaluation of her symptoms and pulmonary hypertension but her preference is to hold off.  Given her age and comorbidities, I think this is reasonable.  CAD:  Calcium score 2093 (97th percentile) on 05/25/2021.  Reported dyspnea on exertion.  Lexiscan Myoview on 08/09/2021 showed normal perfusion, EF 58%.   -Continue Eliquis -Continue atorvastatin  Chronic diastolic heart failure: Echo 06/03/2021 showed EF 50 to 55%, normal RV function, mild biatrial enlargement, mild MR, mild to moderate TR, indeterminate diastolic function.  Appears euvolemic -Continue Lasix 80 mg daily.  Check BMET, magnesium  Persistent atrial fibrillation: Follows with EP.  History of hemorrhagic stroke in 2013.  Has been off anticoagulation, Dr. LQuentin Orediscussed with Dr. SLeonie Manin neurology and agreed to rechallenge with Eliquis.  Started on Eliquis 5 mg twice daily.  On metoprolol 25 mg twice daily for rate control. Zio patch x7 days on 08/11/2021 showed 100% A-fib burden with average rate 84 bpm, frequent PVCs (9.4% of beats) -Continue Eliquis 5 mg twice daily.   -Continue Toprol-XL 50 mg daily  Frequent PVCs: 9.4% of beats on Zio patch 08/11/2021 -Continue Toprol-XL -Follows with Dr. LQuentin Orein EP  Hemorrhagic CVA: In 2013.  Has had no issues since starting Eliquis in 2022  Hypertension: On metoprolol 25 mg  twice daily, Lasix 80 mg daily.  Appears controlled  Hyperlipidemia:  On atorvastatin 40 mg daily.  LDL 61 on 04/27/22   RTC in 6 months    Medication Adjustments/Labs and Tests Ordered: Current medicines are reviewed at length with the patient today.  Concerns regarding medicines are outlined above.  Orders Placed This Encounter  Procedures   Basic metabolic panel   Magnesium   No orders of the defined types were placed in this encounter.    Patient Instructions  Medication Instructions:  Your physician recommends that you continue on your current medications as directed. Please refer to the Current Medication list given to you today.  *If you need a refill on your cardiac medications before your next appointment, please call your pharmacy*  Lab Work: BMET, [place] today  If you have labs (blood work) drawn today and your tests are completely normal, you will receive your results only by: [place] (if you have MyChart) OR A paper copy in the mail If you have any lab test that is abnormal or we need to change your treatment, we will call you to review the results.  Follow-Up: At [facility], you and your health needs are our priority.  As part of our continuing mission to provide you with exceptional heart care, we have created designated Provider Care Teams.  These Care Teams include your primary Cardiologist (physician) and Advanced Practice Providers (APPs -  Physician Assistants and Nurse Practitioners) who all work together to provide you with the care you need, when you need it.  We recommend signing up for the patient portal called "MyChart".  Sign up information is provided on this After Visit Summary.  MyChart is used to connect with patients for Virtual Visits (Telemedicine).  Patients are able to view lab/test results, encounter notes, upcoming appointments, etc.  Non-urgent messages can be sent to your provider as well.   To learn more about what you can  do with MyChart, go to [email or web address].    Your next appointment:   6 month(s)  The format for your next appointment:   In Person  Provider:   Donato Heinz, MD         Signed, Donato Heinz, MD  07/27/2022 2:54 PM    [place]

## 2022-07-28 LAB — BASIC METABOLIC PANEL
BUN/Creatinine Ratio: 16 (ref 12–28)
BUN: 11 mg/dL (ref 8–27)
CO2: 29 mmol/L (ref 20–29)
Calcium: 9.4 mg/dL (ref 8.7–10.3)
Chloride: 93 mmol/L — ABNORMAL LOW (ref 96–106)
Creatinine, Ser: 0.68 mg/dL (ref 0.57–1.00)
Glucose: 114 mg/dL — ABNORMAL HIGH (ref 70–99)
Potassium: 3.5 mmol/L (ref 3.5–5.2)
Sodium: 137 mmol/L (ref 134–144)
eGFR: 87 mL/min/{1.73_m2} (ref >59)

## 2022-07-28 LAB — MAGNESIUM: Magnesium: 1.7 mg/dL (ref 1.6–2.3)

## 2022-08-01 ENCOUNTER — Other Ambulatory Visit (HOSPITAL_COMMUNITY): Payer: Self-pay | Admitting: Physician Assistant

## 2022-08-01 ENCOUNTER — Telehealth: Payer: Self-pay | Admitting: Cardiology

## 2022-08-01 DIAGNOSIS — R634 Abnormal weight loss: Secondary | ICD-10-CM

## 2022-08-01 DIAGNOSIS — R790 Abnormal level of blood mineral: Secondary | ICD-10-CM

## 2022-08-01 DIAGNOSIS — E876 Hypokalemia: Secondary | ICD-10-CM

## 2022-08-01 DIAGNOSIS — K649 Unspecified hemorrhoids: Secondary | ICD-10-CM | POA: Diagnosis not present

## 2022-08-01 DIAGNOSIS — D509 Iron deficiency anemia, unspecified: Secondary | ICD-10-CM | POA: Diagnosis not present

## 2022-08-01 DIAGNOSIS — R195 Other fecal abnormalities: Secondary | ICD-10-CM | POA: Diagnosis not present

## 2022-08-01 NOTE — Telephone Encounter (Signed)
Patient returning call for lab results. 

## 2022-08-01 NOTE — Telephone Encounter (Signed)
Donato Heinz, MD 07/31/2022  6:46 AM EST     Potassium and magnesium low.  Is she taking magnesium 400 mg daily and potassium 10 mEq twice daily?  If so, would increase magnesium to 400 mg twice daily and potassium to 20 mEq twice daily.  Repeat BMET/magnesium in 1 week   Spoke with patient regarding the following results. Patient made aware and patient verbalized understanding.   Patient states she takes Magnesium '400mg'$  daily, and reports that she takes Potassium 32mqs only once daily.   Will forward to Dr. SGardiner Rhymefor recommendations.

## 2022-08-02 NOTE — Telephone Encounter (Signed)
Recommend increasing magnesium to 400 mg twice daily and increasing potassium to 20 mEq once daily.  Would recheck BMET/magnesium in 1 week

## 2022-08-03 MED ORDER — POTASSIUM CHLORIDE CRYS ER 10 MEQ PO TBCR: 20.0000 meq | EXTENDED_RELEASE_TABLET | Freq: Every day | ORAL | 3 refills | Status: DC

## 2022-08-03 MED ORDER — MAGNESIUM OXIDE -MG SUPPLEMENT 400 (240 MG) MG PO TABS: 1.0000 | ORAL_TABLET | Freq: Two times a day (BID) | ORAL | 3 refills | Status: AC

## 2022-08-03 NOTE — Telephone Encounter (Signed)
Patient aware of recommendations and verbalized understanding. Med list updated and lab order placed.

## 2022-08-08 ENCOUNTER — Ambulatory Visit (HOSPITAL_COMMUNITY): Payer: Medicare Other

## 2022-08-08 ENCOUNTER — Telehealth: Payer: Self-pay | Admitting: Cardiology

## 2022-08-08 NOTE — Telephone Encounter (Signed)
Pt calling to make provider aware that she will be having her labs done at PCP and they will send results. Please advise

## 2022-08-08 NOTE — Telephone Encounter (Signed)
Noted-will make MD aware

## 2022-08-14 ENCOUNTER — Other Ambulatory Visit: Payer: Self-pay | Admitting: Family Medicine

## 2022-08-14 DIAGNOSIS — I5030 Unspecified diastolic (congestive) heart failure: Secondary | ICD-10-CM

## 2022-08-14 DIAGNOSIS — E118 Type 2 diabetes mellitus with unspecified complications: Secondary | ICD-10-CM

## 2022-08-14 DIAGNOSIS — K219 Gastro-esophageal reflux disease without esophagitis: Secondary | ICD-10-CM

## 2022-08-15 DIAGNOSIS — I10 Essential (primary) hypertension: Secondary | ICD-10-CM | POA: Diagnosis not present

## 2022-08-15 DIAGNOSIS — E1159 Type 2 diabetes mellitus with other circulatory complications: Secondary | ICD-10-CM | POA: Diagnosis not present

## 2022-08-15 DIAGNOSIS — E785 Hyperlipidemia, unspecified: Secondary | ICD-10-CM | POA: Diagnosis not present

## 2022-08-15 DIAGNOSIS — D649 Anemia, unspecified: Secondary | ICD-10-CM | POA: Diagnosis not present

## 2022-08-15 DIAGNOSIS — R7989 Other specified abnormal findings of blood chemistry: Secondary | ICD-10-CM | POA: Diagnosis not present

## 2022-08-15 DIAGNOSIS — I4821 Permanent atrial fibrillation: Secondary | ICD-10-CM | POA: Diagnosis not present

## 2022-08-16 ENCOUNTER — Encounter: Payer: Self-pay | Admitting: *Deleted

## 2022-08-20 ENCOUNTER — Other Ambulatory Visit: Payer: Self-pay | Admitting: Cardiology

## 2022-08-20 DIAGNOSIS — E7800 Pure hypercholesterolemia, unspecified: Secondary | ICD-10-CM

## 2022-08-31 ENCOUNTER — Ambulatory Visit (HOSPITAL_COMMUNITY): Payer: Medicare Other

## 2022-08-31 ENCOUNTER — Encounter (HOSPITAL_COMMUNITY): Payer: Self-pay

## 2022-09-07 ENCOUNTER — Ambulatory Visit (INDEPENDENT_AMBULATORY_CARE_PROVIDER_SITE_OTHER): Payer: Medicare Other | Admitting: Podiatry

## 2022-09-07 DIAGNOSIS — M79609 Pain in unspecified limb: Secondary | ICD-10-CM | POA: Diagnosis not present

## 2022-09-07 DIAGNOSIS — B351 Tinea unguium: Secondary | ICD-10-CM | POA: Diagnosis not present

## 2022-09-07 NOTE — Patient Instructions (Signed)
Follow up with your orthopedic surgeon regarding removing the plate from the ankle

## 2022-09-11 NOTE — Progress Notes (Signed)
  Subjective:  Patient ID: Karen Jackson, female    DOB: [birth year],  MRN: [id number]  Chief Complaint  Patient presents with   Nail Problem    R great toe has fungus and 2nd toe nail is black. Diabetic foot exam due   Diabetic Ulcer    Chronic x 7 months left lateral ankle - it will heal then if she wears anything besides the boot it opens up again    82 y.o. female presents with the above complaint. History confirmed with patient. She states she is having care already  for the ulceration. The nails are thickened and elongated  Objective:  Physical Exam: warm, good capillary refill, no trophic changes or ulcerative lesions, normal DP and PT pulses, and normal sensory exam. Left Foot: dystrophic yellowed discolored nail plates with subungual debris and partial thickness skin breakdown lateral ankle Right Foot: dystrophic yellowed discolored nail plates with subungual debris   Assessment:   1. Pain due to onychomycosis of nail      Plan:  Patient was evaluated and treated and all questions answered.  Patient educated on diabetes. Discussed proper diabetic foot care and discussed risks and complications of disease. Educated patient in depth on reasons to return to the office immediately should he/she discover anything concerning or new on the feet. All questions answered. Discussed proper shoes as well.   Discussed the etiology and treatment options for the condition in detail with the patient. Educated patient on the topical and oral treatment options for mycotic nails. Recommended debridement of the nails today. Sharp and mechanical debridement performed of all painful and mycotic nails today. Nails debrided in length and thickness using a nail nipper to level of comfort. Discussed treatment options including appropriate shoe gear. Follow up as needed for painful nails.   Return if symptoms worsen or fail to improve.

## 2022-09-19 ENCOUNTER — Other Ambulatory Visit: Payer: Self-pay | Admitting: Family Medicine

## 2022-09-21 DIAGNOSIS — H6123 Impacted cerumen, bilateral: Secondary | ICD-10-CM | POA: Diagnosis not present

## 2022-09-25 NOTE — Telephone Encounter (Signed)
Spoke to patient, she reports labs were drawn at Dr. Silvestre Mesi office.    Called PCP-left message requesting labs be faxed to office.

## 2022-10-02 NOTE — Telephone Encounter (Signed)
Labs received-Dr. Schumann to review. 

## 2022-10-03 NOTE — Telephone Encounter (Signed)
I only see a CBC, did she get that BMET/magnesium?

## 2022-10-06 NOTE — Telephone Encounter (Signed)
Labs received-Dr. Gardiner Rhyme to review.

## 2022-10-06 NOTE — Telephone Encounter (Signed)
Requested labs refaxed

## 2022-10-24 DIAGNOSIS — Z85828 Personal history of other malignant neoplasm of skin: Secondary | ICD-10-CM | POA: Diagnosis not present

## 2022-10-24 DIAGNOSIS — L821 Other seborrheic keratosis: Secondary | ICD-10-CM | POA: Diagnosis not present

## 2022-10-27 ENCOUNTER — Other Ambulatory Visit: Payer: Self-pay | Admitting: Family Medicine

## 2022-11-07 DIAGNOSIS — I10 Essential (primary) hypertension: Secondary | ICD-10-CM | POA: Diagnosis not present

## 2022-11-07 DIAGNOSIS — I6389 Other cerebral infarction: Secondary | ICD-10-CM | POA: Diagnosis not present

## 2022-11-07 DIAGNOSIS — E1159 Type 2 diabetes mellitus with other circulatory complications: Secondary | ICD-10-CM | POA: Diagnosis not present

## 2022-11-07 DIAGNOSIS — M810 Age-related osteoporosis without current pathological fracture: Secondary | ICD-10-CM | POA: Diagnosis not present

## 2022-11-07 DIAGNOSIS — M1990 Unspecified osteoarthritis, unspecified site: Secondary | ICD-10-CM | POA: Diagnosis not present

## 2022-11-07 DIAGNOSIS — E785 Hyperlipidemia, unspecified: Secondary | ICD-10-CM | POA: Diagnosis not present

## 2022-11-07 DIAGNOSIS — I272 Pulmonary hypertension, unspecified: Secondary | ICD-10-CM | POA: Diagnosis not present

## 2022-11-07 DIAGNOSIS — L89523 Pressure ulcer of left ankle, stage 3: Secondary | ICD-10-CM | POA: Diagnosis not present

## 2022-11-07 DIAGNOSIS — I69354 Hemiplegia and hemiparesis following cerebral infarction affecting left non-dominant side: Secondary | ICD-10-CM | POA: Diagnosis not present

## 2022-11-07 DIAGNOSIS — I4821 Permanent atrial fibrillation: Secondary | ICD-10-CM | POA: Diagnosis not present

## 2022-11-13 DIAGNOSIS — G8929 Other chronic pain: Secondary | ICD-10-CM | POA: Diagnosis not present

## 2022-11-13 DIAGNOSIS — M25512 Pain in left shoulder: Secondary | ICD-10-CM | POA: Diagnosis not present

## 2022-11-13 DIAGNOSIS — M25511 Pain in right shoulder: Secondary | ICD-10-CM | POA: Diagnosis not present

## 2022-11-30 ENCOUNTER — Other Ambulatory Visit (HOSPITAL_COMMUNITY): Payer: Self-pay | Admitting: *Deleted

## 2022-12-01 ENCOUNTER — Ambulatory Visit (HOSPITAL_COMMUNITY)
Admission: RE | Admit: 2022-12-01 | Discharge: 2022-12-01 | Disposition: A | Discharge status: Home / Self Care | Payer: Medicare Other | Source: Ambulatory Visit | Attending: Internal Medicine | Admitting: Internal Medicine

## 2022-12-01 DIAGNOSIS — M810 Age-related osteoporosis without current pathological fracture: Secondary | ICD-10-CM | POA: Diagnosis not present

## 2022-12-01 MED ORDER — DENOSUMAB 60 MG/ML ~~LOC~~ SOSY
PREFILLED_SYRINGE | SUBCUTANEOUS | Status: AC
  Filled 2022-12-01: qty 1

## 2022-12-01 MED ORDER — DENOSUMAB 60 MG/ML ~~LOC~~ SOSY
60.0000 mg | PREFILLED_SYRINGE | Freq: Once | SUBCUTANEOUS | Status: AC
  Administered 2022-12-01: 60 mg via SUBCUTANEOUS

## 2022-12-13 ENCOUNTER — Encounter: Payer: Self-pay | Admitting: Podiatry

## 2022-12-13 ENCOUNTER — Ambulatory Visit (INDEPENDENT_AMBULATORY_CARE_PROVIDER_SITE_OTHER): Payer: Medicare Other | Admitting: Podiatry

## 2022-12-13 DIAGNOSIS — E118 Type 2 diabetes mellitus with unspecified complications: Secondary | ICD-10-CM

## 2022-12-13 DIAGNOSIS — B351 Tinea unguium: Secondary | ICD-10-CM | POA: Diagnosis not present

## 2022-12-13 DIAGNOSIS — M79609 Pain in unspecified limb: Secondary | ICD-10-CM | POA: Diagnosis not present

## 2022-12-13 NOTE — Progress Notes (Signed)
This patient returns to my office for at risk foot care.  This patient requires this care by a professional since this patient will be at risk due to having diabetes.  This patient is unable to cut nails herself since the patient cannot reach her nails.These nails are painful walking and wearing shoes. Patient has coagulation defect due to taking eliquis. Patient presents to the office with her caregiver and in a wheelchair. This patient presents for at risk foot care today.  General Appearance  Alert, conversant and in no acute stress.  Vascular  Dorsalis pedis and posterior tibial  pulses are palpable  bilaterally.  Capillary return is within normal limits  bilaterally. Temperature is within normal limits  bilaterally.  Neurologic  Senn-Weinstein monofilament wire test within normal limits  bilaterally. Muscle power within normal limits bilaterally.  Nails Thick disfigured discolored nails with subungual debris  from hallux to fifth toes bilaterally. No evidence of bacterial infection or drainage bilaterally.  Orthopedic  No limitations of motion  feet .  No crepitus or effusions noted.  No bony pathology or digital deformities noted.  Skin  normotropic skin with no porokeratosis noted bilaterally.  No signs of infections or ulcers noted.     Onychomycosis  Pain in right toes  Pain in left toes  Consent was obtained for treatment procedures.   Mechanical debridement of nails 1-5  bilaterally performed with a nail nipper.  Filed with dremel without incident.    Return office visit  4   months                   Told patient to return for periodic foot care and evaluation due to potential at risk complications.   Helane Gunther DPM

## 2022-12-20 DIAGNOSIS — H5213 Myopia, bilateral: Secondary | ICD-10-CM | POA: Diagnosis not present

## 2022-12-20 DIAGNOSIS — E119 Type 2 diabetes mellitus without complications: Secondary | ICD-10-CM | POA: Diagnosis not present

## 2022-12-20 DIAGNOSIS — Z961 Presence of intraocular lens: Secondary | ICD-10-CM | POA: Diagnosis not present

## 2023-01-05 DIAGNOSIS — L0390 Cellulitis, unspecified: Secondary | ICD-10-CM | POA: Diagnosis not present

## 2023-01-05 DIAGNOSIS — I4821 Permanent atrial fibrillation: Secondary | ICD-10-CM | POA: Diagnosis not present

## 2023-01-05 DIAGNOSIS — I10 Essential (primary) hypertension: Secondary | ICD-10-CM | POA: Diagnosis not present

## 2023-01-05 DIAGNOSIS — L89523 Pressure ulcer of left ankle, stage 3: Secondary | ICD-10-CM | POA: Diagnosis not present

## 2023-01-05 DIAGNOSIS — I69354 Hemiplegia and hemiparesis following cerebral infarction affecting left non-dominant side: Secondary | ICD-10-CM | POA: Diagnosis not present

## 2023-01-25 ENCOUNTER — Encounter: Payer: Self-pay | Admitting: Cardiology

## 2023-01-25 ENCOUNTER — Ambulatory Visit: Payer: Medicare Other | Attending: Cardiology | Admitting: Cardiology

## 2023-01-25 VITALS — BP 138/62 | HR 78 | Ht <= 58 in | Wt 160.0 lb

## 2023-01-25 DIAGNOSIS — R0602 Shortness of breath: Secondary | ICD-10-CM

## 2023-01-25 DIAGNOSIS — I2510 Atherosclerotic heart disease of native coronary artery without angina pectoris: Secondary | ICD-10-CM

## 2023-01-25 DIAGNOSIS — I5032 Chronic diastolic (congestive) heart failure: Secondary | ICD-10-CM | POA: Diagnosis not present

## 2023-01-25 DIAGNOSIS — E785 Hyperlipidemia, unspecified: Secondary | ICD-10-CM

## 2023-01-25 DIAGNOSIS — I4819 Other persistent atrial fibrillation: Secondary | ICD-10-CM | POA: Diagnosis not present

## 2023-01-25 DIAGNOSIS — I272 Pulmonary hypertension, unspecified: Secondary | ICD-10-CM | POA: Diagnosis not present

## 2023-01-25 NOTE — Progress Notes (Signed)
Cardiology Office Note:    Date:  01/25/2023   ID:  Karen Jackson, DOB [birth year], MRN [id number]  PCP:  Rodrigo Ran, MD  Cardiologist:  Little Ishikawa, MD  Electrophysiologist:  Lanier Prude, MD   Referring MD: Rodrigo Ran, MD   Chief Complaint  Patient presents with   Congestive Heart Failure    History of Present Illness:    Karen Jackson is a 82 y.o. female with a hx of hemorrhagic CVA in 2013, persistent atrial fibrillation, chronic diastolic heart failure, hypertension, hyperlipidemia who presents for follow-up.  She was seen for an initial visit for diastolic heart failure on 07/26/2021.  She follows with Dr. Lalla Brothers in EP for her A. fib.  Was seen on 07/05/2021 and started on Eliquis.  Noted to be warm and wet on exam, Lasix was increased to 80 mg twice daily x3 days then resume Lasix 80 mg daily.  Echo 06/03/2021 showed EF 50 to 55%, normal RV function, mild biatrial enlargement, mild MR, mild to moderate TR. Calcium score 2093 (97th percentile) on 05/25/2021.  Lexiscan Myoview on 08/09/2021 showed normal perfusion, EF 58%.  Zio patch x7 days on 08/11/2021 showed 100% A-fib burden with average rate 84 bpm, frequent PVCs (9.4% of beats).  Echocardiogram 05/09/22 showed EF 55-60%, normal RV function/mild enlargement, mildy elevated pulmonary pressures (RVSP ), moderate LAE,no significant valvular disease.  Since last clinic visit, she reports she is doing well.  Denies any chest pain, dyspnea, lightheadedness, syncope, or palpitations.  She is taking Lasix, feels like it is keeping the fluid off.  Reports had recent issues with some left lower extremity swelling related to a cellulitis in left ankle, recently completed course of antibiotics.  She is taking Eliquis, denies any bleeding issues.    Wt Readings from Last 3 Encounters:  01/25/23 160 lb (72.6 kg)  07/27/22 155 lb (70.3 kg)  04/27/22 150 lb (68 kg)       Past Medical History:  Diagnosis Date   Acute  cystitis without hematuria    Acute diastolic CHF (congestive heart failure) (HCC)    Arthritis    Dyspnea    Dysrhythmia    Fever of unknown origin 03/19/2017   Hyperlipidemia    Hypertension    denies at preop   Multifocal pneumonia    Neuromuscular disorder (HCC)    neuropathy left arm and foot   Osteopenia    Paralysis (HCC)    partial left side from CVA    Persistent atrial fibrillation (HCC)    PONV (postoperative nausea and vomiting)    Pre-diabetes    Stroke [facility]) 2013   hemmorahgic    Past Surgical History:  Procedure Laterality Date   ANKLE SURGERY     APPENDECTOMY     CHOLECYSTECTOMY     HERNIA REPAIR     Esophagus   JOINT REPLACEMENT     total- right partial- left   MASTECTOMY PARTIAL / LUMPECTOMY  2012   left   ORIF ANKLE FRACTURE Left 07/20/2018   Procedure: OPEN REDUCTION INTERNAL FIXATION (ORIF) ANKLE FRACTURE;  Surgeon: Toni Arthurs, MD;  Location: [facility] OR;  Service: Orthopedics;  Laterality: Left;   TOTAL KNEE ARTHROPLASTY Left 01/27/2019   Procedure: TOTAL KNEE ARTHROPLASTY;  Surgeon: Dannielle Huh, MD;  Location: [facility] ORS;  Service: Orthopedics;  Laterality: Left;    Current Medications: Current Meds  Medication Sig   apixaban (ELIQUIS) 5 MG TABS tablet Take 1 tablet (5 mg total) by mouth 2 (  two) times daily.   atorvastatin (LIPITOR) 40 MG tablet TAKE 1 TABLET(40 MG) BY MOUTH DAILY   Calcium Carb-Cholecalciferol (CALCIUM CARBONATE-VITAMIN D3) 600-400 MG-UNIT TABS Take 1 tablet by mouth daily.   celecoxib (CELEBREX) 200 MG capsule TAKE 1 CAPSULE BY MOUTH DAILY AS NEEDED FOR ARTHRITIS OR PAIN   diclofenac Sodium (VOLTAREN) 1 % GEL APPLY 2 GRAMS EXTERNALLY TO THE AFFECTED AREA FOUR TIMES DAILY   ergocalciferol (VITAMIN D2) 1.25 MG (50000 UT) capsule 50,000 unit   furosemide (LASIX) 80 MG tablet TAKE 1 TABLET BY MOUTH  DAILY   gabapentin (NEURONTIN) 100 MG capsule TAKE 1 CAPSULE BY MOUTH 3  TIMES DAILY   hydrocortisone 2.5 % cream Apply topically 2 (two)  times daily as needed.   lidocaine (LIDODERM) 5 % PLACE 1 PATCH ONTO THE SKIN DAILY. REMOVE AND DISCORD PATCH WITHIN 12 HOURS OR AS DIRECTED BY MD   MAGnesium-Oxide 400 (240 Mg) MG tablet Take 1 tablet (400 mg total) by mouth 2 (two) times daily.   metoprolol succinate (TOPROL-XL) 50 MG 24 hr tablet Take 1 tablet (50 mg total) by mouth daily. Take with or immediately following a meal.   omeprazole (PRILOSEC) 20 MG capsule TAKE 1 CAPSULE BY MOUTH  DAILY   potassium chloride (KLOR-CON M) 10 MEQ tablet Take 2 tablets (20 mEq total) by mouth daily.   Semaglutide, 1 MG/DOSE, (OZEMPIC, 1 MG/DOSE,) 4 MG/3ML SOPN Inject 1 mg into the skin once a week.   traZODone (DESYREL) 50 MG tablet TAKE 1 TABLET BY MOUTH AT  BEDTIME   TURMERIC PO Take 1 capsule by mouth daily.     Allergies:   Codeine phosphate   Social History   Socioeconomic History   Marital status: Widowed    Spouse name: Not on file   Number of children: 2   Years of education: college   Highest education level: Not on file  Occupational History   Occupation: RetiredFilm/video editor  Tobacco Use   Smoking status: Former    Packs/day: 0.50    Years: 23.00    Additional pack years: 0.00    Total pack years: 11.50    Types: Cigarettes    Start date: 08/28/1957    Quit date: 03/09/1981    Years since quitting: 41.9   Smokeless tobacco: Never  Vaping Use   Vaping Use: Never used  Substance and Sexual Activity   Alcohol use: Yes    Alcohol/week: 16.0 standard drinks of alcohol    Types: 2 Glasses of wine, 14 Standard drinks or equivalent per week    Comment: daily one glass   Drug use: No   Sexual activity: Not Currently  Other Topics Concern   Not on file  Social History Narrative   ** Merged History Encounter ** Health Care POA: son, Charly RayEmergency Contact: Ann and Yehuda Savannah, niece & nephew (c) [phone] (h) [phone] of Life Plan: Who lives with you: selfAny pets: noneDiet: Pt has a varied diet of protein, vegetables and  limits "white" foods.Exercise: Pt does water aerobics 3x week, golf 2x weekSeatbelts: Pt reports wearing seatbelt when in vehicle. Sun Exposure/Protection: Pt reports wearing sun screenHobbies: golfing, swimming    Lives alone (has caregivers coming in)   R handed    Social Determinants of Health   Financial Resource Strain: Not on file  Food Insecurity: Not on file  Transportation Needs: Not on file  Physical Activity: Not on file  Stress: Not on file  Social Connections: Not on file  Family History: The patient's family history includes Dementia in her father; Diabetes in her mother; Transient ischemic attack in her father.  ROS:   Please see the history of present illness.     All other systems reviewed and are negative.  EKGs/Labs/Other Studies Reviewed:    The following studies were reviewed today:   EKG:   04/27/2022: Atrial fibrillation, rate 76 01/25/2023: Sinus rhythm, first-degree AV block, PVC, left axis deviation, poor R wave progression  Recent Labs: 04/27/2022: ALT 20; Hemoglobin 12.8; Platelets 351 07/27/2022: BUN 11; Creatinine, Ser 0.68; Magnesium 1.7; Potassium 3.5; Sodium 137  Recent Lipid Panel    Component Value Date/Time   CHOL 164 04/27/2022 1557   TRIG 239 (H) 04/27/2022 1557   HDL 65 04/27/2022 1557   CHOLHDL 2.5 04/27/2022 1557   CHOLHDL 2.5 06/17/2015 1057   VLDL 23 06/17/2015 1057   LDLCALC 61 04/27/2022 1557   LDLDIRECT 107 (H) 04/24/2014 1118    Physical Exam:    VS:  BP 138/62 (BP Location: Left Arm, Patient Position: Sitting, Cuff Size: Normal)   Pulse 78   Ht 4\' 10"  (1.473 m)   Wt 160 lb (72.6 kg)   SpO2 95%   BMI 33.44 kg/m     Wt Readings from Last 3 Encounters:  01/25/23 160 lb (72.6 kg)  07/27/22 155 lb (70.3 kg)  04/27/22 150 lb (68 kg)     GEN:  Well nourished, well developed in no acute distress HEENT: Normal NECK: No JVD; No carotid bruits LYMPHATICS: No lymphadenopathy CARDIAC: irregular, normal rate, no  murmurs, rubs, gallops RESPIRATORY:  Clear to auscultation without rales, wheezing or rhonchi  ABDOMEN: Soft, non-tender, non-distended MUSCULOSKELETAL:  No edema; No deformity  SKIN: Warm and dry NEUROLOGIC:  Alert and oriented x 3 PSYCHIATRIC:  Normal affect   ASSESSMENT:    1. CAD in native artery   2. Pulmonary hypertension (HCC)   3. Chronic diastolic heart failure (HCC)   4. Shortness of breath   5. Persistent atrial fibrillation (HCC)   6. Hyperlipidemia, unspecified hyperlipidemia type      PLAN:     Dyspnea: She reports dyspnea with exertion.  Likely multifactorial with chronic diastolic heart failure and possible restrictive lung disease contributing.  Dilated pulmonary artery on CT suggesting pulmonary hypertension.  Echocardiogram 05/09/22 showed EF 55-60%, normal RV function/mild enlargement, mildy elevated pulmonary pressures (RVSP ), moderate LAE,no significant valvular disease. -Discussed RHC/LHC for further evaluation of her symptoms and pulmonary hypertension but her preference is to hold off.  Given her age and comorbidities, I think this is reasonable.  CAD:  Calcium score 2093 (97th percentile) on 05/25/2021.  Reported dyspnea on exertion.  Lexiscan Myoview on 08/09/2021 showed normal perfusion, EF 58%.   -Continue Eliquis -Continue atorvastatin  Chronic diastolic heart failure: Echo 06/03/2021 showed EF 50 to 55%, normal RV function, mild biatrial enlargement, mild MR, mild to moderate TR, indeterminate diastolic function.  Appears euvolemic -Continue Lasix 80 mg daily.  Taking calcium, magnesium supplement.  Check BMET, magnesium  Persistent atrial fibrillation: Follows with EP.  History of hemorrhagic stroke in 2013.  Has been off anticoagulation, Dr. Lalla Brothers discussed with Dr. Pearlean Brownie in neurology and agreed to rechallenge with Eliquis.  Started on Eliquis 5 mg twice daily.  On metoprolol 25 mg twice daily for rate control. Zio patch x7 days on 08/11/2021  showed 100% A-fib burden with average rate 84 bpm, frequent PVCs (9.4% of beats) -Continue Eliquis 5 mg twice daily.  Check CBC -  Continue Toprol-XL 50 mg daily -She is in sinus rhythm today  Frequent PVCs: 9.4% of beats on Zio patch 08/11/2021 -Continue Toprol-XL -Follows with Dr. Lalla Brothers in EP  Hemorrhagic CVA: In 2013.  Has had no issues since starting Eliquis in 2022  Hypertension: On metoprolol 25 mg twice daily, Lasix 80 mg daily.  Appears controlled  Hyperlipidemia: On atorvastatin 40 mg daily.  LDL 61 on 04/27/22   RTC in 6 months    Medication Adjustments/Labs and Tests Ordered: Current medicines are reviewed at length with the patient today.  Concerns regarding medicines are outlined above.  Orders Placed This Encounter  Procedures   Basic metabolic panel   CBC   Magnesium   EKG 12-Lead   No orders of the defined types were placed in this encounter.    Patient Instructions  Medication Instructions:  Your physician recommends that you continue on your current medications as directed. Please refer to the Current Medication list given to you today.   *If you need a refill on your cardiac medications before your next appointment, please call your pharmacy*  Lab Work: BMET, Mag, CBC today  If you have labs (blood work) drawn today and your tests are completely normal, you will receive your results only by: MyChart Message (if you have MyChart) OR A paper copy in the mail If you have any lab test that is abnormal or we need to change your treatment, we will call you to review the results.  Follow-Up: At [facility], you and your health needs are our priority.  As part of our continuing mission to provide you with exceptional heart care, we have created designated Provider Care Teams.  These Care Teams include your primary Cardiologist (physician) and Advanced Practice Providers (APPs -  Physician Assistants and Nurse Practitioners) who all work together to  provide you with the care you need, when you need it.  We recommend signing up for the patient portal called "MyChart".  Sign up information is provided on this After Visit Summary.  MyChart is used to connect with patients for Virtual Visits (Telemedicine).  Patients are able to view lab/test results, encounter notes, upcoming appointments, etc.  Non-urgent messages can be sent to your provider as well.   To learn more about what you can do with MyChart, go to [email or web address].    Your next appointment:   6 month(s)  Provider:   Little Ishikawa, MD       Signed, Little Ishikawa, MD  01/25/2023 3:31 PM    [facility]

## 2023-01-25 NOTE — Patient Instructions (Signed)
Medication Instructions:  Your physician recommends that you continue on your current medications as directed. Please refer to the Current Medication list given to you today.   *If you need a refill on your cardiac medications before your next appointment, please call your pharmacy*  Lab Work: BMET, Mag, CBC today  If you have labs (blood work) drawn today and your tests are completely normal, you will receive your results only by: MyChart Message (if you have MyChart) OR A paper copy in the mail If you have any lab test that is abnormal or we need to change your treatment, we will call you to review the results.  Follow-Up: At [facility], you and your health needs are our priority.  As part of our continuing mission to provide you with exceptional heart care, we have created designated Provider Care Teams.  These Care Teams include your primary Cardiologist (physician) and Advanced Practice Providers (APPs -  Physician Assistants and Nurse Practitioners) who all work together to provide you with the care you need, when you need it.  We recommend signing up for the patient portal called "MyChart".  Sign up information is provided on this After Visit Summary.  MyChart is used to connect with patients for Virtual Visits (Telemedicine).  Patients are able to view lab/test results, encounter notes, upcoming appointments, etc.  Non-urgent messages can be sent to your provider as well.   To learn more about what you can do with MyChart, go to [email or web address].    Your next appointment:   6 month(s)  Provider:   Little Ishikawa, MD

## 2023-01-26 ENCOUNTER — Other Ambulatory Visit: Payer: Self-pay

## 2023-01-26 DIAGNOSIS — E876 Hypokalemia: Secondary | ICD-10-CM

## 2023-01-26 LAB — CBC
Hematocrit: 42.9 % (ref 34.0–46.6)
Hemoglobin: 14.9 g/dL (ref 11.1–15.9)
MCH: 34.3 pg — ABNORMAL HIGH (ref 26.6–33.0)
MCHC: 34.7 g/dL (ref 31.5–35.7)
MCV: 99 fL — ABNORMAL HIGH (ref 79–97)
Platelets: 295 10*3/uL (ref 150–450)
RBC: 4.34 x10E6/uL (ref 3.77–5.28)
RDW: 12.2 % (ref 11.7–15.4)
WBC: 7.3 10*3/uL (ref 3.4–10.8)

## 2023-01-26 LAB — MAGNESIUM: Magnesium: 1.9 mg/dL (ref 1.6–2.3)

## 2023-01-26 LAB — BASIC METABOLIC PANEL
BUN/Creatinine Ratio: 24 (ref 12–28)
BUN: 13 mg/dL (ref 8–27)
CO2: 26 mmol/L (ref 20–29)
Calcium: 9.9 mg/dL (ref 8.7–10.3)
Chloride: 96 mmol/L (ref 96–106)
Creatinine, Ser: 0.55 mg/dL — ABNORMAL LOW (ref 0.57–1.00)
Glucose: 135 mg/dL — ABNORMAL HIGH (ref 70–99)
Potassium: 3.5 mmol/L (ref 3.5–5.2)
Sodium: 138 mmol/L (ref 134–144)
eGFR: 91 mL/min/{1.73_m2} (ref >59)

## 2023-01-26 MED ORDER — POTASSIUM CHLORIDE CRYS ER 20 MEQ PO TBCR: 20.0000 meq | EXTENDED_RELEASE_TABLET | Freq: Two times a day (BID) | ORAL | 3 refills | Status: DC

## 2023-01-29 DIAGNOSIS — T148XXA Other injury of unspecified body region, initial encounter: Secondary | ICD-10-CM | POA: Diagnosis not present

## 2023-01-29 DIAGNOSIS — I10 Essential (primary) hypertension: Secondary | ICD-10-CM | POA: Diagnosis not present

## 2023-01-29 DIAGNOSIS — I4821 Permanent atrial fibrillation: Secondary | ICD-10-CM | POA: Diagnosis not present

## 2023-01-29 DIAGNOSIS — L89523 Pressure ulcer of left ankle, stage 3: Secondary | ICD-10-CM | POA: Diagnosis not present

## 2023-01-29 DIAGNOSIS — I69354 Hemiplegia and hemiparesis following cerebral infarction affecting left non-dominant side: Secondary | ICD-10-CM | POA: Diagnosis not present

## 2023-01-29 DIAGNOSIS — L0390 Cellulitis, unspecified: Secondary | ICD-10-CM | POA: Diagnosis not present

## 2023-01-30 ENCOUNTER — Encounter: Payer: Self-pay | Admitting: Physical Therapy

## 2023-01-30 ENCOUNTER — Ambulatory Visit: Payer: Medicare Other | Attending: Internal Medicine | Admitting: Physical Therapy

## 2023-01-30 DIAGNOSIS — R262 Difficulty in walking, not elsewhere classified: Secondary | ICD-10-CM | POA: Diagnosis not present

## 2023-01-30 DIAGNOSIS — M6281 Muscle weakness (generalized): Secondary | ICD-10-CM | POA: Insufficient documentation

## 2023-01-30 DIAGNOSIS — I6990 Unspecified sequelae of unspecified cerebrovascular disease: Secondary | ICD-10-CM | POA: Insufficient documentation

## 2023-01-30 DIAGNOSIS — R270 Ataxia, unspecified: Secondary | ICD-10-CM | POA: Diagnosis not present

## 2023-01-30 DIAGNOSIS — M25512 Pain in left shoulder: Secondary | ICD-10-CM | POA: Insufficient documentation

## 2023-01-30 DIAGNOSIS — M25572 Pain in left ankle and joints of left foot: Secondary | ICD-10-CM | POA: Insufficient documentation

## 2023-01-30 DIAGNOSIS — I69354 Hemiplegia and hemiparesis following cerebral infarction affecting left non-dominant side: Secondary | ICD-10-CM | POA: Insufficient documentation

## 2023-01-30 NOTE — Therapy (Signed)
OUTPATIENT PHYSICAL THERAPY LOWER EXTREMITY EVALUATION   Patient Name: Karen Jackson MRN: [id number] DOB:Apr 20, 1941, 82 y.o., female Today's Date: 01/30/2023  END OF SESSION:  PT End of Session - 01/30/23 1149     Visit Number 1    Date for PT Re-Evaluation 05/02/23    Authorization Type Medicare    PT Start Time 1053    PT Stop Time 1138    PT Time Calculation (min) 45 min    Activity Tolerance Patient tolerated treatment well;Patient limited by pain    Behavior During Therapy [facility] for tasks assessed/performed             Past Medical History:  Diagnosis Date   Acute cystitis without hematuria    Acute diastolic CHF (congestive heart failure) (HCC)    Arthritis    Dyspnea    Dysrhythmia    Fever of unknown origin 03/19/2017   Hyperlipidemia    Hypertension    denies at preop   Multifocal pneumonia    Neuromuscular disorder (HCC)    neuropathy left arm and foot   Osteopenia    Paralysis (HCC)    partial left side from CVA    Persistent atrial fibrillation (HCC)    PONV (postoperative nausea and vomiting)    Pre-diabetes    Stroke [facility]) 2013   hemmorahgic   Past Surgical History:  Procedure Laterality Date   ANKLE SURGERY     APPENDECTOMY     CHOLECYSTECTOMY     HERNIA REPAIR     Esophagus   JOINT REPLACEMENT     total- right partial- left   MASTECTOMY PARTIAL / LUMPECTOMY  2012   left   ORIF ANKLE FRACTURE Left 07/20/2018   Procedure: OPEN REDUCTION INTERNAL FIXATION (ORIF) ANKLE FRACTURE;  Surgeon: Toni Arthurs, MD;  Location: [facility] OR;  Service: Orthopedics;  Laterality: Left;   TOTAL KNEE ARTHROPLASTY Left 01/27/2019   Procedure: TOTAL KNEE ARTHROPLASTY;  Surgeon: Dannielle Huh, MD;  Location: [facility];  Service: Orthopedics;  Laterality: Left;   Patient Active Problem List   Diagnosis Date Noted   Goals of care, counseling/discussion 01/17/2022   Grief 01/17/2022   Secondary hypercoagulable state (HCC) 03/16/2021   Dizziness 03/10/2021   Orthostatic  hypotension 10/15/2020   Presbycusis of both ears 03/08/2020   Mixed stress and urge urinary incontinence 12/02/2019   Macrocytosis 12/01/2019   Nutritional anemia 12/01/2019   S/P total knee replacement 01/27/2019   Recurrent left knee instability 07/05/2018   Respiratory failure with hypoxia (HCC) 08/30/2017   Hypoxemia    Heart failure with preserved ejection fraction (HCC), Grade 3 diastolic dysfunction 03/26/2017   PAF (paroxysmal atrial fibrillation) (HCC)    Dyspnea 03/19/2017   Encounter for preventive health examination 02/17/2016   Sensorineural hearing loss (SNHL), bilateral 01/26/2016   Hypomagnesemia 04/24/2014   Hemiparesis affecting left side as late effect of cerebrovascular accident (HCC) 04/24/2014   Nontraumatic cerebral hemorrhage (HCC) 04/30/2012   DM (diabetes mellitus) with complications (HCC) 03/04/2010   OSTEOPENIA 01/21/2009   UNSPECIFIED VITAMIN D DEFICIENCY 11/19/2007   HYPERCHOLESTEROLEMIA 10/25/2006   GASTROESOPHAGEAL REFLUX, NO ESOPHAGITIS 10/25/2006   DIVERTICULOSIS OF COLON 10/25/2006   Osteoarthritis 10/25/2006   CERVICAL SPINE DISORDER, NOS 10/25/2006    PCP: Waynard Edwards, MD  REFERRING PROVIDER: Waynard Edwards, MD  REFERRING DIAG: weakness, difficulty walking  THERAPY DIAG:  Muscle weakness (generalized)  Pain in left ankle and joints of left foot  Difficulty in walking, not elsewhere classified  Left shoulder pain, unspecified chronicity  Hemiplegia  and hemiparesis following cerebral infarction affecting left non-dominant side [facility])  Rationale for Evaluation and Treatment: Rehabilitation  ONSET DATE: 01/29/23  SUBJECTIVE:   SUBJECTIVE STATEMENT: Patient has had a wound on the left lateral malleolus for about a year, she has had decreased activity due to this since then and has regressed in her ability and function. Has a new sore developing above this area due to cellulitis  PERTINENT HISTORY: CHF, A-fib, diabetes, Stroke, left TKA, left  ankle ORIF, ankle fusion PAIN:  Are you having pain? Yes: NPRS scale: 7/10 Pain location: bilateral shoulders, upper traps and the left ankle calf area Pain description: sore and tender Aggravating factors: standing , pressure pain up to 9/10 Relieving factors: heat on the shoulders and then some massage on the shoulders  PRECAUTIONS: None  WEIGHT BEARING RESTRICTIONS: No  FALLS:  Has patient fallen in last 6 months? Yes. Number of falls 1 about a month ago, after shower her hands slipped and she fell backward  LIVING ENVIRONMENT: Lives with: lives alone Lives in: House/apartment Stairs: No Has following equipment at home: Environmental consultant - 2 wheeled, Wheelchair (manual), shower chair, Shower bench, bed side commode, and Ramped entry  PLOF: Needs assistance with ADLs and Needs assistance with transfers she has a caregiver from 8-2, they get her up and do her shower and meals, she goes back to bed at [facility], has another caregiver come at 4:30 but does not get her up and then the caregiver leaves at 8PM, she is in bed except for the time 8-2.  PATIENT GOALS: less pain, better circulation in my legs to decrease the chance of wounds on the leg  NEXT MD VISIT: in a month to 6 weeks  OBJECTIVE:  COGNITION: Overall cognitive status: Within functional limits for tasks assessed     SENSATION: Some decreased sensation of the left foot and ankle  EDEMA:  Has edema of the left foot and ankle up into the lower calf, pitting edema  POSTURE: rounded shoulders, forward head, and increased thoracic kyphosis  PALPATION: Upper traps are very tight and very tender, left is worse than the right, she ha edema of the left ankle, has a small wound that has been there for about 7 months on the lateral malleolus, very tender here as well  LOWER EXTREMITY ROM:  Active ROM Right eval Left eval  Hip flexion 90 60  Hip extension    Hip abduction    Hip adduction    Hip internal rotation    Hip external  rotation    Knee flexion 100 95  Knee extension 5 5  Ankle dorsiflexion    Ankle plantarflexion    Ankle inversion    Ankle eversion     (Blank rows = not tested)  LOWER EXTREMITY MMT:  MMT Right eval Left eval  Hip flexion 3+ 3+  Hip extension    Hip abduction 3+ 3  Hip adduction 4 3  Hip internal rotation    Hip external rotation    Knee flexion 4 3+  Knee extension 4 3+  Ankle dorsiflexion 4 3+  Ankle plantarflexion    Ankle inversion    Ankle eversion     (Blank rows = not tested)  FUNCTIONAL TESTS:  5 times sit to stand: unable to do without Max assist  Timed up and go (TUG): unable to do without max A  GAIT: Distance walked: 10 feet Assistive device utilized: Walker - 2 wheeled Level of assistance: Min A Comments: needs  a person behind with W/C, tends to like only HHA as she has such shoulder issues advance the walker is difficulty and painful, left leg goes out into a wider BOS, left toe at times will drag and catch. Very fatigued  Transfers:  Max A to Mod A   TODAY'S TREATMENT:                                                                                                                              DATE:  01/30/23 Nustep level 4 x 6 minutes    PATIENT EDUCATION:  Education details: POC Person educated: Patient Education method: Medical illustrator Education comprehension: verbalized understanding  HOME EXERCISE PROGRAM: LAQ, marches, ankle pumps  ASSESSMENT:  CLINICAL IMPRESSION: Patient is a 82 y.o. female who was seen today for physical therapy evaluation and treatment for weakness and difficulty walking, she has been seen here previously but has not been in to PT in about a year.  She has a significant hx of stroke, TKA, ankle fusion, and ankle ORIF on the left, the current issue was with the left ankle, she needed to wear a brace on this ankle due to instability, this is the stroke affected side, she developed a sore on the lateral  malleolus, this sore has really not healed over about an 8 month period, she is battling some cellulitis with swelling.  There is a new open wound above the lateral malleolus at the base of the of the lateral calf.  MD feels like she needs to move more and get more circulation to help with this.    OBJECTIVE IMPAIRMENTS: Abnormal gait, cardiopulmonary status limiting activity, decreased activity tolerance, decreased balance, decreased endurance, decreased mobility, difficulty walking, decreased ROM, decreased strength, increased edema, impaired perceived functional ability, increased muscle spasms, impaired flexibility, impaired sensation, postural dysfunction, and pain.   REHAB POTENTIAL: Good  CLINICAL DECISION MAKING: Stable/uncomplicated  EVALUATION COMPLEXITY: Low   GOALS: Goals reviewed with patient? Yes  SHORT TERM GOALS: Target date: 02/20/23 Independent with initial HEP Baseline: Goal status: INITIAL  LONG TERM GOALS: Target date: 05/02/23  Walk with HHA x 100 feet Goal status: INITIAL  2.  Transfer with Min A Goal status: INITIAL  3.  Report pain decreased 50% Goal status: INITIAL  4.  Walk with walker and SBA x 20 feet Goal status: INITIAL  5.  Increase left LE strength to 4-/5 Goal status: INITIAL 6:  Independent with advanced HEP with caregiver  PLAN:  PT FREQUENCY: 1-2x/week  PT DURATION: 12 weeks  PLANNED INTERVENTIONS: Therapeutic exercises, Therapeutic activity, Neuromuscular re-education, Balance training, Gait training, Patient/Family education, Self Care, Joint mobilization, Dry Needling, Electrical stimulation, Cryotherapy, Moist heat, Taping, and Manual therapy  PLAN FOR NEXT SESSION: work on shoulder pain, get her a good exercise routine that she can do with and without caregiver   Jearld Lesch, PT 01/30/2023, 11:50 AM

## 2023-02-01 ENCOUNTER — Ambulatory Visit: Payer: Medicare Other | Admitting: Physical Therapy

## 2023-02-01 ENCOUNTER — Encounter: Payer: Self-pay | Admitting: Physical Therapy

## 2023-02-01 DIAGNOSIS — M25512 Pain in left shoulder: Secondary | ICD-10-CM | POA: Diagnosis not present

## 2023-02-01 DIAGNOSIS — R262 Difficulty in walking, not elsewhere classified: Secondary | ICD-10-CM

## 2023-02-01 DIAGNOSIS — R270 Ataxia, unspecified: Secondary | ICD-10-CM | POA: Diagnosis not present

## 2023-02-01 DIAGNOSIS — M25572 Pain in left ankle and joints of left foot: Secondary | ICD-10-CM

## 2023-02-01 DIAGNOSIS — M6281 Muscle weakness (generalized): Secondary | ICD-10-CM

## 2023-02-01 DIAGNOSIS — I69354 Hemiplegia and hemiparesis following cerebral infarction affecting left non-dominant side: Secondary | ICD-10-CM

## 2023-02-01 DIAGNOSIS — I6990 Unspecified sequelae of unspecified cerebrovascular disease: Secondary | ICD-10-CM

## 2023-02-01 NOTE — Therapy (Signed)
OUTPATIENT PHYSICAL THERAPY LOWER EXTREMITY TREATMENT   Patient Name: Karen Jackson MRN: [id number] DOB:07-16-1941, 82 y.o., female Today's Date: 02/01/2023  END OF SESSION:  PT End of Session - 02/01/23 1113     Visit Number 2    Date for PT Re-Evaluation 05/02/23    Authorization Type Medicare    PT Start Time 1055    PT Stop Time 1145    PT Time Calculation (min) 50 min    Activity Tolerance Patient tolerated treatment well;Patient limited by pain    Behavior During Therapy [facility] for tasks assessed/performed             Past Medical History:  Diagnosis Date   Acute cystitis without hematuria    Acute diastolic CHF (congestive heart failure) (HCC)    Arthritis    Dyspnea    Dysrhythmia    Fever of unknown origin 03/19/2017   Hyperlipidemia    Hypertension    denies at preop   Multifocal pneumonia    Neuromuscular disorder (HCC)    neuropathy left arm and foot   Osteopenia    Paralysis (HCC)    partial left side from CVA    Persistent atrial fibrillation (HCC)    PONV (postoperative nausea and vomiting)    Pre-diabetes    Stroke [facility]) 2013   hemmorahgic   Past Surgical History:  Procedure Laterality Date   ANKLE SURGERY     APPENDECTOMY     CHOLECYSTECTOMY     HERNIA REPAIR     Esophagus   JOINT REPLACEMENT     total- right partial- left   MASTECTOMY PARTIAL / LUMPECTOMY  2012   left   ORIF ANKLE FRACTURE Left 07/20/2018   Procedure: OPEN REDUCTION INTERNAL FIXATION (ORIF) ANKLE FRACTURE;  Surgeon: Toni Arthurs, MD;  Location: [facility] OR;  Service: Orthopedics;  Laterality: Left;   TOTAL KNEE ARTHROPLASTY Left 01/27/2019   Procedure: TOTAL KNEE ARTHROPLASTY;  Surgeon: Dannielle Huh, MD;  Location: [facility] ORS;  Service: Orthopedics;  Laterality: Left;   Patient Active Problem List   Diagnosis Date Noted   Goals of care, counseling/discussion 01/17/2022   Grief 01/17/2022   Secondary hypercoagulable state (HCC) 03/16/2021   Dizziness 03/10/2021   Orthostatic  hypotension 10/15/2020   Presbycusis of both ears 03/08/2020   Mixed stress and urge urinary incontinence 12/02/2019   Macrocytosis 12/01/2019   Nutritional anemia 12/01/2019   S/P total knee replacement 01/27/2019   Recurrent left knee instability 07/05/2018   Respiratory failure with hypoxia (HCC) 08/30/2017   Hypoxemia    Heart failure with preserved ejection fraction (HCC), Grade 3 diastolic dysfunction 03/26/2017   PAF (paroxysmal atrial fibrillation) (HCC)    Dyspnea 03/19/2017   Encounter for preventive health examination 02/17/2016   Sensorineural hearing loss (SNHL), bilateral 01/26/2016   Hypomagnesemia 04/24/2014   Hemiparesis affecting left side as late effect of cerebrovascular accident (HCC) 04/24/2014   Nontraumatic cerebral hemorrhage (HCC) 04/30/2012   DM (diabetes mellitus) with complications (HCC) 03/04/2010   OSTEOPENIA 01/21/2009   UNSPECIFIED VITAMIN D DEFICIENCY 11/19/2007   HYPERCHOLESTEROLEMIA 10/25/2006   GASTROESOPHAGEAL REFLUX, NO ESOPHAGITIS 10/25/2006   DIVERTICULOSIS OF COLON 10/25/2006   Osteoarthritis 10/25/2006   CERVICAL SPINE DISORDER, NOS 10/25/2006    PCP: Waynard Edwards, MD  REFERRING PROVIDER: Waynard Edwards, MD  REFERRING DIAG: weakness, difficulty walking  THERAPY DIAG:  Muscle weakness (generalized)  Pain in left ankle and joints of left foot  Difficulty in walking, not elsewhere classified  Left shoulder pain, unspecified chronicity  Hemiplegia  and hemiparesis following cerebral infarction affecting left non-dominant side (HCC)  Late effects of CVA (cerebrovascular accident)  Rationale for Evaluation and Treatment: Rehabilitation  ONSET DATE: 01/29/23  SUBJECTIVE:   SUBJECTIVE STATEMENT: Patient reports that she felt good after the evaluation, reports that she is sore today  PERTINENT HISTORY: CHF, A-fib, diabetes, Stroke, left TKA, left ankle ORIF, ankle fusion PAIN:  Are you having pain? Yes: NPRS scale: 7/10 Pain location:  bilateral shoulders, upper traps and the left ankle calf area Pain description: sore and tender Aggravating factors: standing , pressure pain up to 9/10 Relieving factors: heat on the shoulders and then some massage on the shoulders  PRECAUTIONS: None  WEIGHT BEARING RESTRICTIONS: No  FALLS:  Has patient fallen in last 6 months? Yes. Number of falls 1 about a month ago, after shower her hands slipped and she fell backward  LIVING ENVIRONMENT: Lives with: lives alone Lives in: House/apartment Stairs: No Has following equipment at home: Environmental consultant - 2 wheeled, Wheelchair (manual), shower chair, Shower bench, bed side commode, and Ramped entry  PLOF: Needs assistance with ADLs and Needs assistance with transfers she has a caregiver from 8-2, they get her up and do her shower and meals, she goes back to bed at [facility], has another caregiver come at 4:30 but does not get her up and then the caregiver leaves at 8PM, she is in bed except for the time 8-2.  PATIENT GOALS: less pain, better circulation in my legs to decrease the chance of wounds on the leg  NEXT MD VISIT: in a month to 6 weeks  OBJECTIVE:  COGNITION: Overall cognitive status: Within functional limits for tasks assessed     SENSATION: Some decreased sensation of the left foot and ankle  EDEMA:  Has edema of the left foot and ankle up into the lower calf, pitting edema  POSTURE: rounded shoulders, forward head, and increased thoracic kyphosis  PALPATION: Upper traps are very tight and very tender, left is worse than the right, she ha edema of the left ankle, has a small wound that has been there for about 7 months on the lateral malleolus, very tender here as well  LOWER EXTREMITY ROM:  Active ROM Right eval Left eval  Hip flexion 90 60  Hip extension    Hip abduction    Hip adduction    Hip internal rotation    Hip external rotation    Knee flexion 100 95  Knee extension 5 5  Ankle dorsiflexion    Ankle  plantarflexion    Ankle inversion    Ankle eversion     (Blank rows = not tested)  LOWER EXTREMITY MMT:  MMT Right eval Left eval  Hip flexion 3+ 3+  Hip extension    Hip abduction 3+ 3  Hip adduction 4 3  Hip internal rotation    Hip external rotation    Knee flexion 4 3+  Knee extension 4 3+  Ankle dorsiflexion 4 3+  Ankle plantarflexion    Ankle inversion    Ankle eversion     (Blank rows = not tested)  FUNCTIONAL TESTS:  5 times sit to stand: unable to do without Max assist  Timed up and go (TUG): unable to do without max A  GAIT: Distance walked: 10 feet Assistive device utilized: Environmental consultant - 2 wheeled Level of assistance: Min A Comments: needs a person behind with W/C, tends to like only HHA as she has such shoulder issues advance the walker is difficulty  and painful, left leg goes out into a wider BOS, left toe at times will drag and catch. Very fatigued  Transfers:  Max A to Mod A   TODAY'S TREATMENT:                                                                                                                              DATE:  02/01/23 STM to the upper traps Nustep level 5 x 8 minutes 2.5# LAQ 3x10 2.5# marches 3x10 Red tband HS curls Red tband left adduction Red tband rows Gait with HHA and chair follow x 14 feet some soreness left ankle  01/30/23 Nustep level 4 x 6 minutes    PATIENT EDUCATION:  Education details: POC Person educated: Patient Education method: Medical illustrator Education comprehension: verbalized understanding  HOME EXERCISE PROGRAM: LAQ, marches, ankle pumps  ASSESSMENT:  CLINICAL IMPRESSION: We initiated exercises as the MD feels that exercises and movement will help circulation and decrease the cellulitis and the wound issues.  She tolerated well, had some soreness in the ankle with walking, some fatigue in the hip, tried some shoulder activities but has severe crepitus and pain with much movements, the rows  seemed to be tolerated best.  Patient is a 82 y.o. female who was seen today for physical therapy evaluation and treatment for weakness and difficulty walking, she has been seen here previously but has not been in to PT in about a year.  She has a significant hx of stroke, TKA, ankle fusion, and ankle ORIF on the left, the current issue was with the left ankle, she needed to wear a brace on this ankle due to instability, this is the stroke affected side, she developed a sore on the lateral malleolus, this sore has really not healed over about an 8 month period, she is battling some cellulitis with swelling.  There is a new open wound above the lateral malleolus at the base of the of the lateral calf.  MD feels like she needs to move more and get more circulation to help with this.    OBJECTIVE IMPAIRMENTS: Abnormal gait, cardiopulmonary status limiting activity, decreased activity tolerance, decreased balance, decreased endurance, decreased mobility, difficulty walking, decreased ROM, decreased strength, increased edema, impaired perceived functional ability, increased muscle spasms, impaired flexibility, impaired sensation, postural dysfunction, and pain.   REHAB POTENTIAL: Good  CLINICAL DECISION MAKING: Stable/uncomplicated  EVALUATION COMPLEXITY: Low   GOALS: Goals reviewed with patient? Yes  SHORT TERM GOALS: Target date: 02/20/23 Independent with initial HEP Baseline: Goal status: INITIAL  LONG TERM GOALS: Target date: 05/02/23  Walk with HHA x 100 feet Goal status: INITIAL  2.  Transfer with Min A Goal status: INITIAL  3.  Report pain decreased 50% Goal status: INITIAL  4.  Walk with walker and SBA x 20 feet Goal status: INITIAL  5.  Increase left LE strength to 4-/5 Goal status: INITIAL 6:  Independent with advanced HEP with caregiver  PLAN:  PT FREQUENCY: 1-2x/week  PT DURATION: 12 weeks  PLANNED INTERVENTIONS: Therapeutic exercises, Therapeutic activity,  Neuromuscular re-education, Balance training, Gait training, Patient/Family education, Self Care, Joint mobilization, Dry Needling, Electrical stimulation, Cryotherapy, Moist heat, Taping, and Manual therapy  PLAN FOR NEXT SESSION: work on shoulder pain, get her a good exercise routine that she can do with and without caregiver   Jearld Lesch, PT 02/01/2023, 11:13 AM

## 2023-02-05 ENCOUNTER — Encounter: Payer: Self-pay | Admitting: Physical Therapy

## 2023-02-05 ENCOUNTER — Ambulatory Visit: Payer: Medicare Other | Admitting: Physical Therapy

## 2023-02-05 DIAGNOSIS — R270 Ataxia, unspecified: Secondary | ICD-10-CM | POA: Diagnosis not present

## 2023-02-05 DIAGNOSIS — M25512 Pain in left shoulder: Secondary | ICD-10-CM

## 2023-02-05 DIAGNOSIS — I69354 Hemiplegia and hemiparesis following cerebral infarction affecting left non-dominant side: Secondary | ICD-10-CM | POA: Diagnosis not present

## 2023-02-05 DIAGNOSIS — I6990 Unspecified sequelae of unspecified cerebrovascular disease: Secondary | ICD-10-CM

## 2023-02-05 DIAGNOSIS — R262 Difficulty in walking, not elsewhere classified: Secondary | ICD-10-CM

## 2023-02-05 DIAGNOSIS — M25572 Pain in left ankle and joints of left foot: Secondary | ICD-10-CM

## 2023-02-05 DIAGNOSIS — M6281 Muscle weakness (generalized): Secondary | ICD-10-CM

## 2023-02-05 NOTE — Therapy (Signed)
OUTPATIENT PHYSICAL THERAPY LOWER EXTREMITY TREATMENT   Patient Name: Karen Jackson MRN: [id number] DOB:Dec 01, 1940, 82 y.o., female Today's Date: 02/05/2023  END OF SESSION:  PT End of Session - 02/05/23 1026     Visit Number 3    Date for PT Re-Evaluation 05/02/23    Authorization Type Medicare    PT Start Time 1010    PT Stop Time 1054    PT Time Calculation (min) 44 min    Activity Tolerance Patient tolerated treatment well;Patient limited by pain    Behavior During Therapy [facility] for tasks assessed/performed             Past Medical History:  Diagnosis Date   Acute cystitis without hematuria    Acute diastolic CHF (congestive heart failure) (HCC)    Arthritis    Dyspnea    Dysrhythmia    Fever of unknown origin 03/19/2017   Hyperlipidemia    Hypertension    denies at preop   Multifocal pneumonia    Neuromuscular disorder (HCC)    neuropathy left arm and foot   Osteopenia    Paralysis (HCC)    partial left side from CVA    Persistent atrial fibrillation (HCC)    PONV (postoperative nausea and vomiting)    Pre-diabetes    Stroke [facility]) 2013   hemmorahgic   Past Surgical History:  Procedure Laterality Date   ANKLE SURGERY     APPENDECTOMY     CHOLECYSTECTOMY     HERNIA REPAIR     Esophagus   JOINT REPLACEMENT     total- right partial- left   MASTECTOMY PARTIAL / LUMPECTOMY  2012   left   ORIF ANKLE FRACTURE Left 07/20/2018   Procedure: OPEN REDUCTION INTERNAL FIXATION (ORIF) ANKLE FRACTURE;  Surgeon: Toni Arthurs, MD;  Location: [facility] OR;  Service: Orthopedics;  Laterality: Left;   TOTAL KNEE ARTHROPLASTY Left 01/27/2019   Procedure: TOTAL KNEE ARTHROPLASTY;  Surgeon: Dannielle Huh, MD;  Location: [facility];  Service: Orthopedics;  Laterality: Left;   Patient Active Problem List   Diagnosis Date Noted   Goals of care, counseling/discussion 01/17/2022   Grief 01/17/2022   Secondary hypercoagulable state (HCC) 03/16/2021   Dizziness 03/10/2021   Orthostatic  hypotension 10/15/2020   Presbycusis of both ears 03/08/2020   Mixed stress and urge urinary incontinence 12/02/2019   Macrocytosis 12/01/2019   Nutritional anemia 12/01/2019   S/P total knee replacement 01/27/2019   Recurrent left knee instability 07/05/2018   Respiratory failure with hypoxia (HCC) 08/30/2017   Hypoxemia    Heart failure with preserved ejection fraction (HCC), Grade 3 diastolic dysfunction 03/26/2017   PAF (paroxysmal atrial fibrillation) (HCC)    Dyspnea 03/19/2017   Encounter for preventive health examination 02/17/2016   Sensorineural hearing loss (SNHL), bilateral 01/26/2016   Hypomagnesemia 04/24/2014   Hemiparesis affecting left side as late effect of cerebrovascular accident (HCC) 04/24/2014   Nontraumatic cerebral hemorrhage (HCC) 04/30/2012   DM (diabetes mellitus) with complications (HCC) 03/04/2010   OSTEOPENIA 01/21/2009   UNSPECIFIED VITAMIN D DEFICIENCY 11/19/2007   HYPERCHOLESTEROLEMIA 10/25/2006   GASTROESOPHAGEAL REFLUX, NO ESOPHAGITIS 10/25/2006   DIVERTICULOSIS OF COLON 10/25/2006   Osteoarthritis 10/25/2006   CERVICAL SPINE DISORDER, NOS 10/25/2006    PCP: Waynard Edwards, MD  REFERRING PROVIDER: Waynard Edwards, MD  REFERRING DIAG: weakness, difficulty walking  THERAPY DIAG:  Pain in left ankle and joints of left foot  Difficulty in walking, not elsewhere classified  Left shoulder pain, unspecified chronicity  Muscle weakness (generalized)  Hemiplegia  and hemiparesis following cerebral infarction affecting left non-dominant side (HCC)  Late effects of CVA (cerebrovascular accident)  Ataxia  Rationale for Evaluation and Treatment: Rehabilitation  ONSET DATE: 01/29/23  SUBJECTIVE:   SUBJECTIVE STATEMENT: Patient reports that the ankle is sore, I did not really do much over the weekend PERTINENT HISTORY: CHF, A-fib, diabetes, Stroke, left TKA, left ankle ORIF, ankle fusion PAIN:  Are you having pain? Yes: NPRS scale: 7/10 Pain location:  bilateral shoulders, upper traps and the left ankle calf area Pain description: sore and tender Aggravating factors: standing , pressure pain up to 9/10 Relieving factors: heat on the shoulders and then some massage on the shoulders  PRECAUTIONS: None  WEIGHT BEARING RESTRICTIONS: No  FALLS:  Has patient fallen in last 6 months? Yes. Number of falls 1 about a month ago, after shower her hands slipped and she fell backward  LIVING ENVIRONMENT: Lives with: lives alone Lives in: House/apartment Stairs: No Has following equipment at home: Environmental consultant - 2 wheeled, Wheelchair (manual), shower chair, Shower bench, bed side commode, and Ramped entry  PLOF: Needs assistance with ADLs and Needs assistance with transfers she has a caregiver from 8-2, they get her up and do her shower and meals, she goes back to bed at [facility], has another caregiver come at 4:30 but does not get her up and then the caregiver leaves at 8PM, she is in bed except for the time 8-2.  PATIENT GOALS: less pain, better circulation in my legs to decrease the chance of wounds on the leg  NEXT MD VISIT: in a month to 6 weeks  OBJECTIVE:  COGNITION: Overall cognitive status: Within functional limits for tasks assessed     SENSATION: Some decreased sensation of the left foot and ankle  EDEMA:  Has edema of the left foot and ankle up into the lower calf, pitting edema  POSTURE: rounded shoulders, forward head, and increased thoracic kyphosis  PALPATION: Upper traps are very tight and very tender, left is worse than the right, she ha edema of the left ankle, has a small wound that has been there for about 7 months on the lateral malleolus, very tender here as well  LOWER EXTREMITY ROM:  Active ROM Right eval Left eval  Hip flexion 90 60  Hip extension    Hip abduction    Hip adduction    Hip internal rotation    Hip external rotation    Knee flexion 100 95  Knee extension 5 5  Ankle dorsiflexion    Ankle  plantarflexion    Ankle inversion    Ankle eversion     (Blank rows = not tested)  LOWER EXTREMITY MMT:  MMT Right eval Left eval  Hip flexion 3+ 3+  Hip extension    Hip abduction 3+ 3  Hip adduction 4 3  Hip internal rotation    Hip external rotation    Knee flexion 4 3+  Knee extension 4 3+  Ankle dorsiflexion 4 3+  Ankle plantarflexion    Ankle inversion    Ankle eversion     (Blank rows = not tested)  FUNCTIONAL TESTS:  5 times sit to stand: unable to do without Max assist  Timed up and go (TUG): unable to do without max A  GAIT: Distance walked: 10 feet Assistive device utilized: Environmental consultant - 2 wheeled Level of assistance: Min A Comments: needs a person behind with W/C, tends to like only HHA as she has such shoulder issues advance the walker  is difficulty and painful, left leg goes out into a wider BOS, left toe at times will drag and catch. Very fatigued  Transfers:  Max A to Mod A   TODAY'S TREATMENT:                                                                                                                              DATE:  02/05/23 STM to the upper traps Approximation of the left fingers Nustep level 5 x 7 minutes cue to keep the left knee in Walk HHA with chair follow x15 feet, rest and then x 20 feet Left LAQ 2.5# 3x10 March 2.5# 3x10 Red tband left hip adduction Red tband HS curls Her caregiver put the boot on wrong today so we spent some time working on this and getting the boot right prior to walking since she already has issues with skin integrity Standing weight shifts  02/01/23 STM to the upper traps Nustep level 5 x 8 minutes 2.5# LAQ 3x10 2.5# marches 3x10 Red tband HS curls Red tband left adduction Red tband rows Gait with HHA and chair follow x 14 feet some soreness left ankle  01/30/23 Nustep level 4 x 6 minutes    PATIENT EDUCATION:  Education details: POC Person educated: Patient Education method: Software engineer Education comprehension: verbalized understanding  HOME EXERCISE PROGRAM: LAQ, marches, ankle pumps  ASSESSMENT:  CLINICAL IMPRESSION: We initiated exercises as the MD feels that exercises and movement will help circulation and decrease the cellulitis and the wound issues.  I had to adjust the boot as it was not on correctly today, she needs a lot of cues to have the left foot and knee to be under her, she is putting it out to the side and I feel is putting more pressure on the ankle, I am working on the hip adduction and really having her focus on this during the nustep as she will tend to let it fall out.  I have called orthotist and she will work on the brace that Gervaise has and will try to get with [place] next week so we can get this large boot off  Patient is a 82 y.o. female who was seen today for physical therapy evaluation and treatment for weakness and difficulty walking, she has been seen here previously but has not been in to PT in about a year.  She has a significant hx of stroke, TKA, ankle fusion, and ankle ORIF on the left, the current issue was with the left ankle, she needed to wear a brace on this ankle due to instability, this is the stroke affected side, she developed a sore on the lateral malleolus, this sore has really not healed over about an 8 month period, she is battling some cellulitis with swelling.  There is a new open wound above the lateral malleolus at the base of the of the lateral calf.  MD feels like she needs to  move more and get more circulation to help with this.    OBJECTIVE IMPAIRMENTS: Abnormal gait, cardiopulmonary status limiting activity, decreased activity tolerance, decreased balance, decreased endurance, decreased mobility, difficulty walking, decreased ROM, decreased strength, increased edema, impaired perceived functional ability, increased muscle spasms, impaired flexibility, impaired sensation, postural dysfunction, and pain.   REHAB  POTENTIAL: Good  CLINICAL DECISION MAKING: Stable/uncomplicated  EVALUATION COMPLEXITY: Low   GOALS: Goals reviewed with patient? Yes  SHORT TERM GOALS: Target date: 02/20/23 Independent with initial HEP Baseline: Goal status: INITIAL  LONG TERM GOALS: Target date: 05/02/23  Walk with HHA x 100 feet Goal status: INITIAL  2.  Transfer with Min A Goal status: INITIAL  3.  Report pain decreased 50% Goal status: INITIAL  4.  Walk with walker and SBA x 20 feet Goal status: INITIAL  5.  Increase left LE strength to 4-/5 Goal status: INITIAL 6:  Independent with advanced HEP with caregiver  PLAN:  PT FREQUENCY: 1-2x/week  PT DURATION: 12 weeks  PLANNED INTERVENTIONS: Therapeutic exercises, Therapeutic activity, Neuromuscular re-education, Balance training, Gait training, Patient/Family education, Self Care, Joint mobilization, Dry Needling, Electrical stimulation, Cryotherapy, Moist heat, Taping, and Manual therapy  PLAN FOR NEXT SESSION: work on shoulder pain, get her a good exercise routine that she can do with and without caregiver   Jearld Lesch, PT 02/05/2023, 10:27 AM

## 2023-02-07 ENCOUNTER — Ambulatory Visit: Payer: Medicare Other | Admitting: Physical Therapy

## 2023-02-07 ENCOUNTER — Encounter: Payer: Self-pay | Admitting: Physical Therapy

## 2023-02-07 DIAGNOSIS — M25512 Pain in left shoulder: Secondary | ICD-10-CM

## 2023-02-07 DIAGNOSIS — R270 Ataxia, unspecified: Secondary | ICD-10-CM

## 2023-02-07 DIAGNOSIS — I69354 Hemiplegia and hemiparesis following cerebral infarction affecting left non-dominant side: Secondary | ICD-10-CM

## 2023-02-07 DIAGNOSIS — R262 Difficulty in walking, not elsewhere classified: Secondary | ICD-10-CM | POA: Diagnosis not present

## 2023-02-07 DIAGNOSIS — I6990 Unspecified sequelae of unspecified cerebrovascular disease: Secondary | ICD-10-CM

## 2023-02-07 DIAGNOSIS — M6281 Muscle weakness (generalized): Secondary | ICD-10-CM

## 2023-02-07 DIAGNOSIS — M25572 Pain in left ankle and joints of left foot: Secondary | ICD-10-CM | POA: Diagnosis not present

## 2023-02-07 NOTE — Therapy (Signed)
OUTPATIENT PHYSICAL THERAPY LOWER EXTREMITY TREATMENT   Patient Name: Karen Jackson MRN: [id number] DOB:Oct 04, 1940, 82 y.o., female Today's Date: 02/07/2023  END OF SESSION:  PT End of Session - 02/07/23 1021     Visit Number 4    Date for PT Re-Evaluation 05/02/23    Authorization Type Medicare    PT Start Time 1012    PT Stop Time 1100    PT Time Calculation (min) 48 min    Activity Tolerance Patient tolerated treatment well;Patient limited by pain    Behavior During Therapy [facility] for tasks assessed/performed             Past Medical History:  Diagnosis Date   Acute cystitis without hematuria    Acute diastolic CHF (congestive heart failure) (HCC)    Arthritis    Dyspnea    Dysrhythmia    Fever of unknown origin 03/19/2017   Hyperlipidemia    Hypertension    denies at preop   Multifocal pneumonia    Neuromuscular disorder (HCC)    neuropathy left arm and foot   Osteopenia    Paralysis (HCC)    partial left side from CVA    Persistent atrial fibrillation (HCC)    PONV (postoperative nausea and vomiting)    Pre-diabetes    Stroke [facility]) 2013   hemmorahgic   Past Surgical History:  Procedure Laterality Date   ANKLE SURGERY     APPENDECTOMY     CHOLECYSTECTOMY     HERNIA REPAIR     Esophagus   JOINT REPLACEMENT     total- right partial- left   MASTECTOMY PARTIAL / LUMPECTOMY  2012   left   ORIF ANKLE FRACTURE Left 07/20/2018   Procedure: OPEN REDUCTION INTERNAL FIXATION (ORIF) ANKLE FRACTURE;  Surgeon: Toni Arthurs, MD;  Location: [facility] OR;  Service: Orthopedics;  Laterality: Left;   TOTAL KNEE ARTHROPLASTY Left 01/27/2019   Procedure: TOTAL KNEE ARTHROPLASTY;  Surgeon: Dannielle Huh, MD;  Location: [facility] ORS;  Service: Orthopedics;  Laterality: Left;   Patient Active Problem List   Diagnosis Date Noted   Goals of care, counseling/discussion 01/17/2022   Grief 01/17/2022   Secondary hypercoagulable state (HCC) 03/16/2021   Dizziness 03/10/2021   Orthostatic  hypotension 10/15/2020   Presbycusis of both ears 03/08/2020   Mixed stress and urge urinary incontinence 12/02/2019   Macrocytosis 12/01/2019   Nutritional anemia 12/01/2019   S/P total knee replacement 01/27/2019   Recurrent left knee instability 07/05/2018   Respiratory failure with hypoxia (HCC) 08/30/2017   Hypoxemia    Heart failure with preserved ejection fraction (HCC), Grade 3 diastolic dysfunction 03/26/2017   PAF (paroxysmal atrial fibrillation) (HCC)    Dyspnea 03/19/2017   Encounter for preventive health examination 02/17/2016   Sensorineural hearing loss (SNHL), bilateral 01/26/2016   Hypomagnesemia 04/24/2014   Hemiparesis affecting left side as late effect of cerebrovascular accident (HCC) 04/24/2014   Nontraumatic cerebral hemorrhage (HCC) 04/30/2012   DM (diabetes mellitus) with complications (HCC) 03/04/2010   OSTEOPENIA 01/21/2009   UNSPECIFIED VITAMIN D DEFICIENCY 11/19/2007   HYPERCHOLESTEROLEMIA 10/25/2006   GASTROESOPHAGEAL REFLUX, NO ESOPHAGITIS 10/25/2006   DIVERTICULOSIS OF COLON 10/25/2006   Osteoarthritis 10/25/2006   CERVICAL SPINE DISORDER, NOS 10/25/2006    PCP: Waynard Edwards, MD  REFERRING PROVIDER: Waynard Edwards, MD  REFERRING DIAG: weakness, difficulty walking  THERAPY DIAG:  Pain in left ankle and joints of left foot  Difficulty in walking, not elsewhere classified  Muscle weakness (generalized)  Hemiplegia and hemiparesis following cerebral infarction affecting  left non-dominant side (HCC)  Late effects of CVA (cerebrovascular accident)  Left shoulder pain, unspecified chronicity  Ataxia  Rationale for Evaluation and Treatment: Rehabilitation  ONSET DATE: 01/29/23  SUBJECTIVE:   SUBJECTIVE STATEMENT: Patient reports that she noticed yesterday that the wound above the ankle was open and bleeding.  She does report being very sore she took a picture of the bleeding and I suggested trying to send to the MD PERTINENT HISTORY: CHF, A-fib,  diabetes, Stroke, left TKA, left ankle ORIF, ankle fusion PAIN:  Are you having pain? Yes: NPRS scale: 7/10 Pain location: bilateral shoulders, upper traps and the left ankle calf area Pain description: sore and tender Aggravating factors: standing , pressure pain up to 9/10 Relieving factors: heat on the shoulders and then some massage on the shoulders  PRECAUTIONS: None  WEIGHT BEARING RESTRICTIONS: No  FALLS:  Has patient fallen in last 6 months? Yes. Number of falls 1 about a month ago, after shower her hands slipped and she fell backward  LIVING ENVIRONMENT: Lives with: lives alone Lives in: House/apartment Stairs: No Has following equipment at home: Environmental consultant - 2 wheeled, Wheelchair (manual), shower chair, Shower bench, bed side commode, and Ramped entry  PLOF: Needs assistance with ADLs and Needs assistance with transfers she has a caregiver from 8-2, they get her up and do her shower and meals, she goes back to bed at [facility], has another caregiver come at 4:30 but does not get her up and then the caregiver leaves at 8PM, she is in bed except for the time 8-2.  PATIENT GOALS: less pain, better circulation in my legs to decrease the chance of wounds on the leg  NEXT MD VISIT: in a month to 6 weeks  OBJECTIVE:  COGNITION: Overall cognitive status: Within functional limits for tasks assessed     SENSATION: Some decreased sensation of the left foot and ankle  EDEMA:  Has edema of the left foot and ankle up into the lower calf, pitting edema  POSTURE: rounded shoulders, forward head, and increased thoracic kyphosis  PALPATION: Upper traps are very tight and very tender, left is worse than the right, she ha edema of the left ankle, has a small wound that has been there for about 7 months on the lateral malleolus, very tender here as well  LOWER EXTREMITY ROM:  Active ROM Right eval Left eval  Hip flexion 90 60  Hip extension    Hip abduction    Hip adduction    Hip  internal rotation    Hip external rotation    Knee flexion 100 95  Knee extension 5 5  Ankle dorsiflexion    Ankle plantarflexion    Ankle inversion    Ankle eversion     (Blank rows = not tested)  LOWER EXTREMITY MMT:  MMT Right eval Left eval  Hip flexion 3+ 3+  Hip extension    Hip abduction 3+ 3  Hip adduction 4 3  Hip internal rotation    Hip external rotation    Knee flexion 4 3+  Knee extension 4 3+  Ankle dorsiflexion 4 3+  Ankle plantarflexion    Ankle inversion    Ankle eversion     (Blank rows = not tested)  FUNCTIONAL TESTS:  5 times sit to stand: unable to do without Max assist  Timed up and go (TUG): unable to do without max A  GAIT: Distance walked: 10 feet Assistive device utilized: Walker - 2 wheeled Level of assistance:  Min A Comments: needs a person behind with W/C, tends to like only HHA as she has such shoulder issues advance the walker is difficulty and painful, left leg goes out into a wider BOS, left toe at times will drag and catch. Very fatigued  Transfers:  Max A to Mod A   TODAY'S TREATMENT:                                                                                                                              DATE:  02/07/23 Nustep level 4 x 10 minutes 2# march 3x10 2# LAQ Red tband adduction Red tband HS curls Red tband left ankle PF/DF Ankle circles STM to the shoulders and upper traps into the rhomboids Wound inspection  02/05/23 STM to the upper traps Approximation of the left fingers Nustep level 5 x 7 minutes cue to keep the left knee in Walk HHA with chair follow x15 feet, rest and then x 20 feet Left LAQ 2.5# 3x10 March 2.5# 3x10 Red tband left hip adduction Red tband HS curls Her caregiver put the boot on wrong today so we spent some time working on this and getting the boot right prior to walking since she already has issues with skin integrity Standing weight shifts  02/01/23 STM to the upper traps Nustep  level 5 x 8 minutes 2.5# LAQ 3x10 2.5# marches 3x10 Red tband HS curls Red tband left adduction Red tband rows Gait with HHA and chair follow x 14 feet some soreness left ankle  01/30/23 Nustep level 4 x 6 minutes    PATIENT EDUCATION:  Education details: POC Person educated: Patient Education method: Medical illustrator Education comprehension: verbalized understanding  HOME EXERCISE PROGRAM: LAQ, marches, ankle pumps  ASSESSMENT:  CLINICAL IMPRESSION: We initiated exercises as the MD feels that exercises and movement will help circulation and decrease the cellulitis and the wound issues.  I took off the boot today to assess the wound and the skin as she has a picture from yesterday with it bleeding.  I did not see bleeding or oozing.  I rewrapped.  We did do more ankle ROM and movements to help with the circulation. I did not walk with her today only the transfers and she denied pain.  We did not walk todya  Patient is a 82 y.o. female who was seen today for physical therapy evaluation and treatment for weakness and difficulty walking, she has been seen here previously but has not been in to PT in about a year.  She has a significant hx of stroke, TKA, ankle fusion, and ankle ORIF on the left, the current issue was with the left ankle, she needed to wear a brace on this ankle due to instability, this is the stroke affected side, she developed a sore on the lateral malleolus, this sore has really not healed over about an 8 month period, she is battling some cellulitis with swelling.  There is  a new open wound above the lateral malleolus at the base of the of the lateral calf.  MD feels like she needs to move more and get more circulation to help with this.    OBJECTIVE IMPAIRMENTS: Abnormal gait, cardiopulmonary status limiting activity, decreased activity tolerance, decreased balance, decreased endurance, decreased mobility, difficulty walking, decreased ROM, decreased strength,  increased edema, impaired perceived functional ability, increased muscle spasms, impaired flexibility, impaired sensation, postural dysfunction, and pain.   REHAB POTENTIAL: Good  CLINICAL DECISION MAKING: Stable/uncomplicated  EVALUATION COMPLEXITY: Low   GOALS: Goals reviewed with patient? Yes  SHORT TERM GOALS: Target date: 02/20/23 Independent with initial HEP Baseline: Goal status: met 02/07/23  LONG TERM GOALS: Target date: 05/02/23  Walk with HHA x 100 feet Goal status: INITIAL  2.  Transfer with Min A Goal status: INITIAL  3.  Report pain decreased 50% Goal status: INITIAL  4.  Walk with walker and SBA x 20 feet Goal status: INITIAL  5.  Increase left LE strength to 4-/5 Goal status: INITIAL 6:  Independent with advanced HEP with caregiver  PLAN:  PT FREQUENCY: 1-2x/week  PT DURATION: 12 weeks  PLANNED INTERVENTIONS: Therapeutic exercises, Therapeutic activity, Neuromuscular re-education, Balance training, Gait training, Patient/Family education, Self Care, Joint mobilization, Dry Needling, Electrical stimulation, Cryotherapy, Moist heat, Taping, and Manual therapy  PLAN FOR NEXT SESSION: work on shoulder pain, get her a good exercise routine that she can do with and without caregiver, assure the wound is healing, may get orthotist to come to next appointment   Jearld Lesch, PT 02/07/2023, 10:22 AM

## 2023-02-09 DIAGNOSIS — E876 Hypokalemia: Secondary | ICD-10-CM | POA: Diagnosis not present

## 2023-02-12 ENCOUNTER — Encounter: Payer: Self-pay | Admitting: Physical Therapy

## 2023-02-12 ENCOUNTER — Ambulatory Visit: Payer: Medicare Other | Admitting: Physical Therapy

## 2023-02-12 DIAGNOSIS — R262 Difficulty in walking, not elsewhere classified: Secondary | ICD-10-CM

## 2023-02-12 DIAGNOSIS — M6281 Muscle weakness (generalized): Secondary | ICD-10-CM

## 2023-02-12 DIAGNOSIS — M25512 Pain in left shoulder: Secondary | ICD-10-CM | POA: Diagnosis not present

## 2023-02-12 DIAGNOSIS — M25572 Pain in left ankle and joints of left foot: Secondary | ICD-10-CM

## 2023-02-12 DIAGNOSIS — I69354 Hemiplegia and hemiparesis following cerebral infarction affecting left non-dominant side: Secondary | ICD-10-CM

## 2023-02-12 DIAGNOSIS — R270 Ataxia, unspecified: Secondary | ICD-10-CM

## 2023-02-12 DIAGNOSIS — I6990 Unspecified sequelae of unspecified cerebrovascular disease: Secondary | ICD-10-CM

## 2023-02-12 NOTE — Therapy (Signed)
OUTPATIENT PHYSICAL THERAPY LOWER EXTREMITY TREATMENT   Patient Name: Karen Jackson MRN: [id number] DOB:1941-01-03, 82 y.o., female Today's Date: 02/12/2023  END OF SESSION:  PT End of Session - 02/12/23 1016     Visit Number 5    Date for PT Re-Evaluation 05/02/23    Authorization Type Medicare    PT Start Time 1014    PT Stop Time 1110    PT Time Calculation (min) 56 min    Activity Tolerance Patient tolerated treatment well;Patient limited by pain    Behavior During Therapy [facility] for tasks assessed/performed             Past Medical History:  Diagnosis Date   Acute cystitis without hematuria    Acute diastolic CHF (congestive heart failure) (HCC)    Arthritis    Dyspnea    Dysrhythmia    Fever of unknown origin 03/19/2017   Hyperlipidemia    Hypertension    denies at preop   Multifocal pneumonia    Neuromuscular disorder (HCC)    neuropathy left arm and foot   Osteopenia    Paralysis (HCC)    partial left side from CVA    Persistent atrial fibrillation (HCC)    PONV (postoperative nausea and vomiting)    Pre-diabetes    Stroke [facility]) 2013   hemmorahgic   Past Surgical History:  Procedure Laterality Date   ANKLE SURGERY     APPENDECTOMY     CHOLECYSTECTOMY     HERNIA REPAIR     Esophagus   JOINT REPLACEMENT     total- right partial- left   MASTECTOMY PARTIAL / LUMPECTOMY  2012   left   ORIF ANKLE FRACTURE Left 07/20/2018   Procedure: OPEN REDUCTION INTERNAL FIXATION (ORIF) ANKLE FRACTURE;  Surgeon: Toni Arthurs, MD;  Location: [facility] OR;  Service: Orthopedics;  Laterality: Left;   TOTAL KNEE ARTHROPLASTY Left 01/27/2019   Procedure: TOTAL KNEE ARTHROPLASTY;  Surgeon: Dannielle Huh, MD;  Location: [facility];  Service: Orthopedics;  Laterality: Left;   Patient Active Problem List   Diagnosis Date Noted   Goals of care, counseling/discussion 01/17/2022   Grief 01/17/2022   Secondary hypercoagulable state (HCC) 03/16/2021   Dizziness 03/10/2021   Orthostatic  hypotension 10/15/2020   Presbycusis of both ears 03/08/2020   Mixed stress and urge urinary incontinence 12/02/2019   Macrocytosis 12/01/2019   Nutritional anemia 12/01/2019   S/P total knee replacement 01/27/2019   Recurrent left knee instability 07/05/2018   Respiratory failure with hypoxia (HCC) 08/30/2017   Hypoxemia    Heart failure with preserved ejection fraction (HCC), Grade 3 diastolic dysfunction 03/26/2017   PAF (paroxysmal atrial fibrillation) (HCC)    Dyspnea 03/19/2017   Encounter for preventive health examination 02/17/2016   Sensorineural hearing loss (SNHL), bilateral 01/26/2016   Hypomagnesemia 04/24/2014   Hemiparesis affecting left side as late effect of cerebrovascular accident (HCC) 04/24/2014   Nontraumatic cerebral hemorrhage (HCC) 04/30/2012   DM (diabetes mellitus) with complications (HCC) 03/04/2010   OSTEOPENIA 01/21/2009   UNSPECIFIED VITAMIN D DEFICIENCY 11/19/2007   HYPERCHOLESTEROLEMIA 10/25/2006   GASTROESOPHAGEAL REFLUX, NO ESOPHAGITIS 10/25/2006   DIVERTICULOSIS OF COLON 10/25/2006   Osteoarthritis 10/25/2006   CERVICAL SPINE DISORDER, NOS 10/25/2006    PCP: Waynard Edwards, MD  REFERRING PROVIDER: Waynard Edwards, MD  REFERRING DIAG: weakness, difficulty walking  THERAPY DIAG:  Pain in left ankle and joints of left foot  Difficulty in walking, not elsewhere classified  Muscle weakness (generalized)  Hemiplegia and hemiparesis following cerebral infarction affecting  left non-dominant side (HCC)  Ataxia  Late effects of CVA (cerebrovascular accident)  Rationale for Evaluation and Treatment: Rehabilitation  ONSET DATE: 01/29/23  SUBJECTIVE:   SUBJECTIVE STATEMENT: Patient reports that she did not transfer this weekend and her ankle is still very sore. PERTINENT HISTORY: CHF, A-fib, diabetes, Stroke, left TKA, left ankle ORIF, ankle fusion PAIN:  Are you having pain? Yes: NPRS scale: 7/10 Pain location: bilateral shoulders, upper traps and the  left ankle calf area Pain description: sore and tender Aggravating factors: standing , pressure pain up to 9/10 Relieving factors: heat on the shoulders and then some massage on the shoulders  PRECAUTIONS: None  WEIGHT BEARING RESTRICTIONS: No  FALLS:  Has patient fallen in last 6 months? Yes. Number of falls 1 about a month ago, after shower her hands slipped and she fell backward  LIVING ENVIRONMENT: Lives with: lives alone Lives in: House/apartment Stairs: No Has following equipment at home: Environmental consultant - 2 wheeled, Wheelchair (manual), shower chair, Shower bench, bed side commode, and Ramped entry  PLOF: Needs assistance with ADLs and Needs assistance with transfers she has a caregiver from 8-2, they get her up and do her shower and meals, she goes back to bed at [facility], has another caregiver come at 4:30 but does not get her up and then the caregiver leaves at 8PM, she is in bed except for the time 8-2.  PATIENT GOALS: less pain, better circulation in my legs to decrease the chance of wounds on the leg  NEXT MD VISIT: in a month to 6 weeks  OBJECTIVE:  COGNITION: Overall cognitive status: Within functional limits for tasks assessed     SENSATION: Some decreased sensation of the left foot and ankle  EDEMA:  Has edema of the left foot and ankle up into the lower calf, pitting edema  POSTURE: rounded shoulders, forward head, and increased thoracic kyphosis  PALPATION: Upper traps are very tight and very tender, left is worse than the right, she ha edema of the left ankle, has a small wound that has been there for about 7 months on the lateral malleolus, very tender here as well  LOWER EXTREMITY ROM:  Active ROM Right eval Left eval  Hip flexion 90 60  Hip extension    Hip abduction    Hip adduction    Hip internal rotation    Hip external rotation    Knee flexion 100 95  Knee extension 5 5  Ankle dorsiflexion    Ankle plantarflexion    Ankle inversion    Ankle  eversion     (Blank rows = not tested)  LOWER EXTREMITY MMT:  MMT Right eval Left eval  Hip flexion 3+ 3+  Hip extension    Hip abduction 3+ 3  Hip adduction 4 3  Hip internal rotation    Hip external rotation    Knee flexion 4 3+  Knee extension 4 3+  Ankle dorsiflexion 4 3+  Ankle plantarflexion    Ankle inversion    Ankle eversion     (Blank rows = not tested)  FUNCTIONAL TESTS:  5 times sit to stand: unable to do without Max assist  Timed up and go (TUG): unable to do without max A  GAIT: Distance walked: 10 feet Assistive device utilized: Environmental consultant - 2 wheeled Level of assistance: Min A Comments: needs a person behind with W/C, tends to like only HHA as she has such shoulder issues advance the walker is difficulty and painful, left leg  goes out into a wider BOS, left toe at times will drag and catch. Very fatigued  Transfers:  Max A to Mod A   TODAY'S TREATMENT:                                                                                                                              DATE:  02/12/23 Nustep level 5 x 7 minutes 2.5# marches 2.5# LAQ Red tband hip adduction Red tband HS curls STM to the left upper trap and neck Red tband left ankle all motions 3x10 Gentle PROM of the left ankle gentle approximation  02/07/23 Nustep level 4 x 10 minutes 2# march 3x10 2# LAQ Red tband adduction Red tband HS curls Red tband left ankle PF/DF Ankle circles STM to the shoulders and upper traps into the rhomboids Wound inspection  02/05/23 STM to the upper traps Approximation of the left fingers Nustep level 5 x 7 minutes cue to keep the left knee in Walk HHA with chair follow x15 feet, rest and then x 20 feet Left LAQ 2.5# 3x10 March 2.5# 3x10 Red tband left hip adduction Red tband HS curls Her caregiver put the boot on wrong today so we spent some time working on this and getting the boot right prior to walking since she already has issues with skin  integrity Standing weight shifts  02/01/23 STM to the upper traps Nustep level 5 x 8 minutes 2.5# LAQ 3x10 2.5# marches 3x10 Red tband HS curls Red tband left adduction Red tband rows Gait with HHA and chair follow x 14 feet some soreness left ankle  01/30/23 Nustep level 4 x 6 minutes    PATIENT EDUCATION:  Education details: POC Person educated: Patient Education method: Medical illustrator Education comprehension: verbalized understanding  HOME EXERCISE PROGRAM: LAQ, marches, ankle pumps  ASSESSMENT:  CLINICAL IMPRESSION: Continued to work the left LE, hip , knee and ankle to help with the circulation.  She has difficulty with hip adduction and then lateral ankle motions.  She can do it but really has to concentrate on what she is doing.  The orthotist came at the end of the session and placed the old brace that she had modified on the leg. She did not have her shoe so we did not stand with this  Patient is a 82 y.o. female who was seen today for physical therapy evaluation and treatment for weakness and difficulty walking, she has been seen here previously but has not been in to PT in about a year.  She has a significant hx of stroke, TKA, ankle fusion, and ankle ORIF on the left, the current issue was with the left ankle, she needed to wear a brace on this ankle due to instability, this is the stroke affected side, she developed a sore on the lateral malleolus, this sore has really not healed over about an 8 month period, she is battling some cellulitis with swelling.  There  is a new open wound above the lateral malleolus at the base of the of the lateral calf.  MD feels like she needs to move more and get more circulation to help with this.    OBJECTIVE IMPAIRMENTS: Abnormal gait, cardiopulmonary status limiting activity, decreased activity tolerance, decreased balance, decreased endurance, decreased mobility, difficulty walking, decreased ROM, decreased strength,  increased edema, impaired perceived functional ability, increased muscle spasms, impaired flexibility, impaired sensation, postural dysfunction, and pain.   REHAB POTENTIAL: Good  CLINICAL DECISION MAKING: Stable/uncomplicated  EVALUATION COMPLEXITY: Low   GOALS: Goals reviewed with patient? Yes  SHORT TERM GOALS: Target date: 02/20/23 Independent with initial HEP Baseline: Goal status: met 02/07/23  LONG TERM GOALS: Target date: 05/02/23  Walk with HHA x 100 feet Goal status: INITIAL  2.  Transfer with Min A Goal status: INITIAL  3.  Report pain decreased 50% Goal status: INITIAL  4.  Walk with walker and SBA x 20 feet Goal status: INITIAL  5.  Increase left LE strength to 4-/5 Goal status: INITIAL 6:  Independent with advanced HEP with caregiver  PLAN:  PT FREQUENCY: 1-2x/week  PT DURATION: 12 weeks  PLANNED INTERVENTIONS: Therapeutic exercises, Therapeutic activity, Neuromuscular re-education, Balance training, Gait training, Patient/Family education, Self Care, Joint mobilization, Dry Needling, Electrical stimulation, Cryotherapy, Moist heat, Taping, and Manual therapy  PLAN FOR NEXT SESSION: she is to bring the new brace and her shoe next visit   , W, PT 02/12/2023, 10:17 AM

## 2023-02-14 ENCOUNTER — Ambulatory Visit: Payer: Medicare Other | Admitting: Physical Therapy

## 2023-02-14 ENCOUNTER — Encounter: Payer: Self-pay | Admitting: Physical Therapy

## 2023-02-14 DIAGNOSIS — I6990 Unspecified sequelae of unspecified cerebrovascular disease: Secondary | ICD-10-CM

## 2023-02-14 DIAGNOSIS — M25572 Pain in left ankle and joints of left foot: Secondary | ICD-10-CM

## 2023-02-14 DIAGNOSIS — R270 Ataxia, unspecified: Secondary | ICD-10-CM

## 2023-02-14 DIAGNOSIS — I69354 Hemiplegia and hemiparesis following cerebral infarction affecting left non-dominant side: Secondary | ICD-10-CM

## 2023-02-14 DIAGNOSIS — M25512 Pain in left shoulder: Secondary | ICD-10-CM

## 2023-02-14 DIAGNOSIS — R262 Difficulty in walking, not elsewhere classified: Secondary | ICD-10-CM

## 2023-02-14 DIAGNOSIS — M6281 Muscle weakness (generalized): Secondary | ICD-10-CM

## 2023-02-14 NOTE — Therapy (Signed)
OUTPATIENT PHYSICAL THERAPY LOWER EXTREMITY TREATMENT   Patient Name: Karen Jackson MRN: [id number] DOB:1941-01-06, 82 y.o., female Today's Date: 02/14/2023  END OF SESSION:  PT End of Session - 02/14/23 1021     Visit Number 6    Date for PT Re-Evaluation 05/02/23    Authorization Type Medicare    PT Start Time 1011    PT Stop Time 1056    PT Time Calculation (min) 45 min    Activity Tolerance Patient tolerated treatment well;Patient limited by pain    Behavior During Therapy [facility] for tasks assessed/performed             Past Medical History:  Diagnosis Date   Acute cystitis without hematuria    Acute diastolic CHF (congestive heart failure) (HCC)    Arthritis    Dyspnea    Dysrhythmia    Fever of unknown origin 03/19/2017   Hyperlipidemia    Hypertension    denies at preop   Multifocal pneumonia    Neuromuscular disorder (HCC)    neuropathy left arm and foot   Osteopenia    Paralysis (HCC)    partial left side from CVA    Persistent atrial fibrillation (HCC)    PONV (postoperative nausea and vomiting)    Pre-diabetes    Stroke [facility]) 2013   hemmorahgic   Past Surgical History:  Procedure Laterality Date   ANKLE SURGERY     APPENDECTOMY     CHOLECYSTECTOMY     HERNIA REPAIR     Esophagus   JOINT REPLACEMENT     total- right partial- left   MASTECTOMY PARTIAL / LUMPECTOMY  2012   left   ORIF ANKLE FRACTURE Left 07/20/2018   Procedure: OPEN REDUCTION INTERNAL FIXATION (ORIF) ANKLE FRACTURE;  Surgeon: Toni Arthurs, MD;  Location: [facility] OR;  Service: Orthopedics;  Laterality: Left;   TOTAL KNEE ARTHROPLASTY Left 01/27/2019   Procedure: TOTAL KNEE ARTHROPLASTY;  Surgeon: Dannielle Huh, MD;  Location: [facility] ORS;  Service: Orthopedics;  Laterality: Left;   Patient Active Problem List   Diagnosis Date Noted   Goals of care, counseling/discussion 01/17/2022   Grief 01/17/2022   Secondary hypercoagulable state (HCC) 03/16/2021   Dizziness 03/10/2021   Orthostatic  hypotension 10/15/2020   Presbycusis of both ears 03/08/2020   Mixed stress and urge urinary incontinence 12/02/2019   Macrocytosis 12/01/2019   Nutritional anemia 12/01/2019   S/P total knee replacement 01/27/2019   Recurrent left knee instability 07/05/2018   Respiratory failure with hypoxia (HCC) 08/30/2017   Hypoxemia    Heart failure with preserved ejection fraction (HCC), Grade 3 diastolic dysfunction 03/26/2017   PAF (paroxysmal atrial fibrillation) (HCC)    Dyspnea 03/19/2017   Encounter for preventive health examination 02/17/2016   Sensorineural hearing loss (SNHL), bilateral 01/26/2016   Hypomagnesemia 04/24/2014   Hemiparesis affecting left side as late effect of cerebrovascular accident (HCC) 04/24/2014   Nontraumatic cerebral hemorrhage (HCC) 04/30/2012   DM (diabetes mellitus) with complications (HCC) 03/04/2010   OSTEOPENIA 01/21/2009   UNSPECIFIED VITAMIN D DEFICIENCY 11/19/2007   HYPERCHOLESTEROLEMIA 10/25/2006   GASTROESOPHAGEAL REFLUX, NO ESOPHAGITIS 10/25/2006   DIVERTICULOSIS OF COLON 10/25/2006   Osteoarthritis 10/25/2006   CERVICAL SPINE DISORDER, NOS 10/25/2006    PCP: Waynard Edwards, MD  REFERRING PROVIDER: Waynard Edwards, MD  REFERRING DIAG: weakness, difficulty walking  THERAPY DIAG:  Pain in left ankle and joints of left foot  Difficulty in walking, not elsewhere classified  Muscle weakness (generalized)  Hemiplegia and hemiparesis following cerebral infarction affecting  left non-dominant side (HCC)  Ataxia  Late effects of CVA (cerebrovascular accident)  Left shoulder pain, unspecified chronicity  Rationale for Evaluation and Treatment: Rehabilitation  ONSET DATE: 01/29/23  SUBJECTIVE:   SUBJECTIVE STATEMENT: Patient reports that Monday night her ankle hurt and she had some blood , we did not walk with the new brace that day but she did put the brace on for 45 minutes and took it off due to pain, reports did not transfer with it on.  PERTINENT  HISTORY: CHF, A-fib, diabetes, Stroke, left TKA, left ankle ORIF, ankle fusion PAIN:  Are you having pain? Yes: NPRS scale: 7/10 Pain location: bilateral shoulders, upper traps and the left ankle calf area Pain description: sore and tender Aggravating factors: standing , pressure pain up to 9/10 Relieving factors: heat on the shoulders and then some massage on the shoulders  PRECAUTIONS: None  WEIGHT BEARING RESTRICTIONS: No  FALLS:  Has patient fallen in last 6 months? Yes. Number of falls 1 about a month ago, after shower her hands slipped and she fell backward  LIVING ENVIRONMENT: Lives with: lives alone Lives in: House/apartment Stairs: No Has following equipment at home: Environmental consultant - 2 wheeled, Wheelchair (manual), shower chair, Shower bench, bed side commode, and Ramped entry  PLOF: Needs assistance with ADLs and Needs assistance with transfers she has a caregiver from 8-2, they get her up and do her shower and meals, she goes back to bed at [facility], has another caregiver come at 4:30 but does not get her up and then the caregiver leaves at 8PM, she is in bed except for the time 8-2.  PATIENT GOALS: less pain, better circulation in my legs to decrease the chance of wounds on the leg  NEXT MD VISIT: in a month to 6 weeks  OBJECTIVE:  COGNITION: Overall cognitive status: Within functional limits for tasks assessed     SENSATION: Some decreased sensation of the left foot and ankle  EDEMA:  Has edema of the left foot and ankle up into the lower calf, pitting edema  POSTURE: rounded shoulders, forward head, and increased thoracic kyphosis  PALPATION: Upper traps are very tight and very tender, left is worse than the right, she ha edema of the left ankle, has a small wound that has been there for about 7 months on the lateral malleolus, very tender here as well  LOWER EXTREMITY ROM:  Active ROM Right eval Left eval  Hip flexion 90 60  Hip extension    Hip abduction    Hip  adduction    Hip internal rotation    Hip external rotation    Knee flexion 100 95  Knee extension 5 5  Ankle dorsiflexion    Ankle plantarflexion    Ankle inversion    Ankle eversion     (Blank rows = not tested)  LOWER EXTREMITY MMT:  MMT Right eval Left eval  Hip flexion 3+ 3+  Hip extension    Hip abduction 3+ 3  Hip adduction 4 3  Hip internal rotation    Hip external rotation    Knee flexion 4 3+  Knee extension 4 3+  Ankle dorsiflexion 4 3+  Ankle plantarflexion    Ankle inversion    Ankle eversion     (Blank rows = not tested)  FUNCTIONAL TESTS:  5 times sit to stand: unable to do without Max assist  Timed up and go (TUG): unable to do without max A  GAIT: Distance walked: 10 feet  Assistive device utilized: Environmental consultant - 2 wheeled Level of assistance: Min A Comments: needs a person behind with W/C, tends to like only HHA as she has such shoulder issues advance the walker is difficulty and painful, left leg goes out into a wider BOS, left toe at times will drag and catch. Very fatigued  Transfers:  Max A to Mod A   TODAY'S TREATMENT:                                                                                                                              DATE:  02/14/23 Nustep level 4 x 10 minutes Left ankle circles, yellow tband PF/DF Red tband hip adduction Red tband HS curls LAQ Marches Met with patient and orthotist regarding the brace and the wound and our next plans, see below  02/12/23 Nustep level 5 x 7 minutes 2.5# marches 2.5# LAQ Red tband hip adduction Red tband HS curls STM to the left upper trap and neck Red tband left ankle all motions 3x10 Gentle PROM of the left ankle gentle approximation  02/07/23 Nustep level 4 x 10 minutes 2# march 3x10 2# LAQ Red tband adduction Red tband HS curls Red tband left ankle PF/DF Ankle circles STM to the shoulders and upper traps into the rhomboids Wound inspection  02/05/23 STM to the upper  traps Approximation of the left fingers Nustep level 5 x 7 minutes cue to keep the left knee in Walk HHA with chair follow x15 feet, rest and then x 20 feet Left LAQ 2.5# 3x10 March 2.5# 3x10 Red tband left hip adduction Red tband HS curls Her caregiver put the boot on wrong today so we spent some time working on this and getting the boot right prior to walking since she already has issues with skin integrity Standing weight shifts  02/01/23 STM to the upper traps Nustep level 5 x 8 minutes 2.5# LAQ 3x10 2.5# marches 3x10 Red tband HS curls Red tband left adduction Red tband rows Gait with HHA and chair follow x 14 feet some soreness left ankle  01/30/23 Nustep level 4 x 6 minutes    PATIENT EDUCATION:  Education details: POC Person educated: Patient Education method: Medical illustrator Education comprehension: verbalized understanding  HOME EXERCISE PROGRAM: LAQ, marches, ankle pumps  ASSESSMENT:  CLINICAL IMPRESSION: Patient reported to me that she had soreness on Monday night and bleeding from the wound that is higher on her leg, she reports that she wore the new brace about 40 minutes, she did not transfer with it, but reports that it was hurting at 40 minutes.  I did have the orthotist come in today and she talked about how to altar the device but we decided to have Nyrie see the wound nurse or MD regarding this, we suggested to not wear the new brace and stay with the boot for transfers and we may opt to not bear weight in PT until this wound is  healed and we will continue with activities to help with blood flow  Patient is a 82 y.o. female who was seen today for physical therapy evaluation and treatment for weakness and difficulty walking, she has been seen here previously but has not been in to PT in about a year.  She has a significant hx of stroke, TKA, ankle fusion, and ankle ORIF on the left, the current issue was with the left ankle, she needed to wear a  brace on this ankle due to instability, this is the stroke affected side, she developed a sore on the lateral malleolus, this sore has really not healed over about an 8 month period, she is battling some cellulitis with swelling.  There is a new open wound above the lateral malleolus at the base of the of the lateral calf.  MD feels like she needs to move more and get more circulation to help with this.    OBJECTIVE IMPAIRMENTS: Abnormal gait, cardiopulmonary status limiting activity, decreased activity tolerance, decreased balance, decreased endurance, decreased mobility, difficulty walking, decreased ROM, decreased strength, increased edema, impaired perceived functional ability, increased muscle spasms, impaired flexibility, impaired sensation, postural dysfunction, and pain.   REHAB POTENTIAL: Good  CLINICAL DECISION MAKING: Stable/uncomplicated  EVALUATION COMPLEXITY: Low   GOALS: Goals reviewed with patient? Yes  SHORT TERM GOALS: Target date: 02/20/23 Independent with initial HEP Baseline: Goal status: met 02/07/23  LONG TERM GOALS: Target date: 05/02/23  Walk with HHA x 100 feet Goal status: INITIAL  2.  Transfer with Min A Goal status: INITIAL  3.  Report pain decreased 50% Goal status: INITIAL  4.  Walk with walker and SBA x 20 feet Goal status: INITIAL  5.  Increase left LE strength to 4-/5 Goal status: INITIAL 6:  Independent with advanced HEP with caregiver  PLAN:  PT FREQUENCY: 1-2x/week  PT DURATION: 12 weeks  PLANNED INTERVENTIONS: Therapeutic exercises, Therapeutic activity, Neuromuscular re-education, Balance training, Gait training, Patient/Family education, Self Care, Joint mobilization, Dry Needling, Electrical stimulation, Cryotherapy, Moist heat, Taping, and Manual therapy  PLAN FOR NEXT SESSION: see clinical impression above   Jearld Lesch, PT 02/14/2023, 12:05 PM

## 2023-02-15 DIAGNOSIS — L0390 Cellulitis, unspecified: Secondary | ICD-10-CM | POA: Diagnosis not present

## 2023-02-15 DIAGNOSIS — I4821 Permanent atrial fibrillation: Secondary | ICD-10-CM | POA: Diagnosis not present

## 2023-02-15 DIAGNOSIS — T148XXA Other injury of unspecified body region, initial encounter: Secondary | ICD-10-CM | POA: Diagnosis not present

## 2023-02-15 DIAGNOSIS — I10 Essential (primary) hypertension: Secondary | ICD-10-CM | POA: Diagnosis not present

## 2023-02-15 DIAGNOSIS — I69354 Hemiplegia and hemiparesis following cerebral infarction affecting left non-dominant side: Secondary | ICD-10-CM | POA: Diagnosis not present

## 2023-02-15 DIAGNOSIS — L89523 Pressure ulcer of left ankle, stage 3: Secondary | ICD-10-CM | POA: Diagnosis not present

## 2023-02-19 ENCOUNTER — Ambulatory Visit: Payer: Medicare Other | Admitting: Physical Therapy

## 2023-02-21 ENCOUNTER — Ambulatory Visit: Payer: Medicare Other | Admitting: Physical Therapy

## 2023-02-22 NOTE — Progress Notes (Addendum)
Karen Jackson ([id number]) [phone] [id number] Page 1 of 4 Visit Report for 02/23/2023 Abuse Risk Screen Details Patient Name: Date of Service: Karen Jackson F. 02/23/2023 8:00 A M Medical Record Number: [id number] Patient Account Number: [id number] Date of Birth/Sex: Treating RN: 03/22/1941 (82 y.o. Arta Silence Primary Care : Ezequiel Kayser Other Clinician: Referring : Treating /Extender: Ermalinda Barrios in Treatment: 0 Abuse Risk Screen Items Answer ABUSE RISK SCREEN: Has anyone close to you tried to hurt or harm you recentlyo No Do you feel uncomfortable with anyone in your familyo No Has anyone forced you do things that you didnt want to doo No Electronic Signature(s) Signed: 02/23/2023 2:14:44 PM By: Shawn Stall RN, BSN Entered By: Shawn Stall on 02/23/2023 08:28:51 -------------------------------------------------------------------------------- Activities of Daily Living Details Patient Name: Date of Service: Karen Jackson F. 02/23/2023 8:00 A M Medical Record Number: [id number] Patient Account Number: [id number] Date of Birth/Sex: Treating RN: [birth year] (82 y.o. F) Primary Care : Ezequiel Kayser Other Clinician: Referring : Treating /Extender: Ermalinda Barrios in Treatment: 0 Activities of Daily Living Items Answer Activities of Daily Living (Please select one for each item) Drive Automobile Not Able T Medications ake Need Assistance Use T elephone Completely Able Care for Appearance Need Assistance Use T oilet Need Assistance Bath / Shower Need Assistance Dress Self Need Assistance Feed Self Need Assistance Walk Need Assistance Get In / Out Bed Need Assistance Housework Need Assistance Prepare Meals Need Assistance Handle Money Completely Able Shop for Self Need Assistance Electronic Signature(s) Signed: 02/23/2023 2:14:44 PM By: Shawn Stall  RN, BSN Previous Signature: 02/22/2023 10:26:41 AM Version By: Pearletha Alfred Entered By: Shawn Stall on 02/23/2023 08:29:08 Andrey Campanile ([id number]) [id number] Page 2 of 4 -------------------------------------------------------------------------------- Education Screening Details Patient Name: Date of Service: Karen Jackson F. 02/23/2023 8:00 A M Medical Record Number: [id number] Patient Account Number: [id number] Date of Birth/Sex: Treating RN: March 01, 1941 (82 y.o. [name], [name] Primary Care : Ezequiel Kayser Other Clinician: Referring : Treating /Extender: Ermalinda Barrios in Treatment: 0 Primary Learner Assessed: Patient Learning Preferences/Education Level/Primary Language Learning Preference: Explanation, Demonstration, Printed Material Highest Education Level: College or Above Preferred Language: English Cognitive Barrier Language Barrier: No Translator Needed: No Memory Deficit: No Emotional Barrier: No Cultural/Religious Beliefs Affecting Medical Care: No Physical Barrier Impaired Vision: No Impaired Hearing: Yes Hearing Aid Decreased Hand dexterity: No Knowledge/Comprehension Knowledge Level: High Comprehension Level: High Ability to understand written instructions: High Ability to understand verbal instructions: High Motivation Anxiety Level: Calm Cooperation: Cooperative Education Importance: Acknowledges Need Interest in Health Problems: Asks Questions Perception: Coherent Willingness to Engage in Self-Management High Activities: Readiness to Engage in Self-Management High Activities: Electronic Signature(s) Signed: 02/23/2023 2:14:44 PM By: Shawn Stall RN, BSN Entered By: Shawn Stall on 02/23/2023 08:29:43 -------------------------------------------------------------------------------- Fall Risk Assessment Details Patient Name: Date of Service: Karen Argyle NE F. 02/23/2023  8:00 A M Medical Record Number: [id number] Patient Account Number: [id number] Date of Birth/Sex: Treating RN: 02/15/1941 (81 y.o. Arta Silence Primary Care : Ezequiel Kayser Other Clinician: Referring : Treating /Extender: Ermalinda Barrios in Treatment: 0 Fall Risk Assessment Items Jackson, TINO ([id number]) [phone] [id number] Page 3 of 4 Have you had 2 or more falls in the last 12 monthso 0 No Have you had any fall that resulted in injury in the last 12 monthso 0 No FALLS RISK SCREEN  History of falling - immediate or within 3 months 25 Yes Secondary diagnosis (Do you have 2 or more medical diagnoseso) 0 No Ambulatory aid None/bed rest/wheelchair/nurse 0 Yes Crutches/cane/walker 0 No Furniture 0 No Intravenous therapy Access/Saline/Heparin Lock 0 No Gait/Transferring Normal/ bed rest/ wheelchair 0 Yes Weak (short steps with or without shuffle, stooped but able to lift head while walking, may seek 0 No support from furniture) Impaired (short steps with shuffle, may have difficulty arising from chair, head down, impaired 20 Yes balance) Mental Status Oriented to own ability 0 Yes Electronic Signature(s) Signed: 02/23/2023 2:14:44 PM By: Shawn Stall RN, BSN Entered By: Shawn Stall on 02/23/2023 08:30:12 -------------------------------------------------------------------------------- Foot Assessment Details Patient Name: Date of Service: Karen Argyle NE F. 02/23/2023 8:00 A M Medical Record Number: [id number] Patient Account Number: [id number] Date of Birth/Sex: Treating RN: 1941/01/14 (82 y.o. Karen Jackson Primary Care : Ezequiel Kayser Other Clinician: Referring : Treating /Extender: Ermalinda Barrios in Treatment: 0 Foot Assessment Items Site Locations + = Sensation present, - = Sensation absent, C = Callus, U = Ulcer R = Redness, W = Warmth, M =  Maceration, PU = Pre-ulcerative lesion F = Fissure, S = Swelling, D = Dryness Assessment Right: Left: Other Deformity: No No Prior Foot Ulcer: No No Prior Amputation: No No Charcot Joint: No No Ambulatory Status: Ambulatory With Help Assistance Device: Wheelchair ZULIANA, DUPONT ([id number]) [phone] [id number] Page 4 of 4 Gait: Steady Electronic Signature(s) Signed: 02/23/2023 12:58:46 PM By: Karie Schwalbe RN Entered By: Karie Schwalbe on 02/23/2023 08:38:57 -------------------------------------------------------------------------------- Nutrition Risk Screening Details Patient Name: Date of Service: Karen Jackson F. 02/23/2023 8:00 A M Medical Record Number: [id number] Patient Account Number: [id number] Date of Birth/Sex: Treating RN: 04-01-1941 (82 y.o. Arta Silence Primary Care : Ezequiel Kayser Other Clinician: Referring : Treating /Extender: Levell July Weeks in Treatment: 0 Height (in): Weight (lbs): Body Mass Index (BMI): Nutrition Risk Screening Items Score Screening NUTRITION RISK SCREEN: I have an illness or condition that made me change the kind and/or amount of food I eat 2 Yes I eat fewer than two meals per day 0 No I eat few fruits and vegetables, or milk products 0 No I have three or more drinks of beer, liquor or wine almost every day 0 No I have tooth or mouth problems that make it hard for me to eat 0 No I don't always have enough money to buy the food I need 0 No I eat alone most of the time 0 No I take three or more different prescribed or over-the-counter drugs a day 1 Yes Without wanting to, I have lost or gained 10 pounds in the last six months 0 No I am not always physically able to shop, cook and/or feed myself 2 Yes Nutrition Protocols Good Risk Protocol Moderate Risk Protocol 0 Provide education on nutrition High Risk Proctocol Risk Level: Moderate Risk Score: 5 Electronic  Signature(s) Signed: 02/23/2023 2:14:44 PM By: Shawn Stall RN, BSN Entered By: Shawn Stall on 02/23/2023 08:30:38

## 2023-02-22 NOTE — Progress Notes (Addendum)
SHANIKQUA, RUNNING ([id number]) [id number] Page 1 of 9 Visit Report for 02/23/2023 Allergy List Details Patient Name: Date of Service: Karen Jackson [place] F. 02/23/2023 8:00 A M Medical Record Number: [id number] Patient Account Number: [id number] Date of Birth/Sex: Treating RN: 06/25/1941 (82 y.o. F) Primary Care : Ezequiel Kayser Other Clinician: Referring : Treating /Extender: Levell July Weeks in Treatment: 0 Allergies Active Allergies No Known Drug Allergies Allergy Notes Electronic Signature(s) Signed: 02/23/2023 2:14:44 PM By: Shawn Stall RN, BSN Previous Signature: 02/22/2023 10:24:52 AM Version By: Pearletha Alfred Entered By: Shawn Stall on 02/23/2023 08:25:13 -------------------------------------------------------------------------------- Arrival Information Details Patient Name: Date of Service: Karen [place] F. 02/23/2023 8:00 A M Medical Record Number: [id number] Patient Account Number: [id number] Date of Birth/Sex: Treating RN: 15-Jun-1941 (82 y.o. Arta Silence Primary Care : Ezequiel Kayser Other Clinician: Referring : Treating /Extender: Ermalinda Barrios in Treatment: 0 Visit Information Patient Arrived: Wheel Chair Arrival Time: 08:23 Accompanied By: niece Transfer Assistance: Manual Patient Identification Verified: Yes Secondary Verification Process Completed: Yes Patient Requires Transmission-Based Precautions: No Patient Has Alerts: Yes Patient Alerts: Patient on Blood Thinner Eliquis Electronic Signature(s) Signed: 02/23/2023 2:14:44 PM By: Shawn Stall RN, BSN Entered By: Shawn Stall on 02/23/2023 08:24:42 Clinic Level of Care Assessment Details -------------------------------------------------------------------------------- Andrey Campanile ([id number]) [id number] Page 2 of 9 Patient Name: Date of Service: Karen Jackson [place] F.  02/23/2023 8:00 A M Medical Record Number: [id number] Patient Account Number: [id number] Date of Birth/Sex: Treating RN: Jan 08, 1941 (82 y.o. [name] Primary Care : Ezequiel Kayser Other Clinician: Referring : Treating /Extender: Ermalinda Barrios in Treatment: 0 Clinic Level of Care Assessment Items TOOL 1 Quantity Score X- 1 0 Use when EandM and Procedure is performed on INITIAL visit ASSESSMENTS - Nursing Assessment / Reassessment X- 1 20 General Physical Exam (combine w/ comprehensive assessment (listed just below) when performed on new pt. evals) X- 1 25 Comprehensive Assessment (HX, ROS, Risk Assessments, Wounds Hx, etc.) ASSESSMENTS - Wound and Skin Assessment / Reassessment X- 1 10 Dermatologic / Skin Assessment (not related to wound area) ASSESSMENTS - Ostomy and/or Continence Assessment and Care []  - 0 Incontinence Assessment and Management []  - 0 Ostomy Care Assessment and Management (repouching, etc.) PROCESS - Coordination of Care []  - 0 Simple Patient / Family Education for ongoing care X- 1 20 Complex (extensive) Patient / Family Education for ongoing care X- 1 10 Staff obtains Chiropractor, Records, T Results / Process Orders est X- 1 10 Staff telephones HHA, Nursing Homes / Clarify orders / etc []  - 0 Routine Transfer to another Facility (non-emergent condition) []  - 0 Routine Hospital Admission (non-emergent condition) X- 1 15 New Admissions / Manufacturing engineer / Ordering NPWT Apligraf, etc. , []  - 0 Emergency Hospital Admission (emergent condition) PROCESS - Special Needs []  - 0 Pediatric / Minor Patient Management []  - 0 Isolation Patient Management []  - 0 Hearing / Language / Visual special needs []  - 0 Assessment of Community assistance (transportation, D/C planning, etc.) []  - 0 Additional assistance / Altered mentation []  - 0 Support Surface(s) Assessment (bed, cushion, seat,  etc.) INTERVENTIONS - Miscellaneous []  - 0 External ear exam []  - 0 Patient Transfer (multiple staff / Nurse, adult / Similar devices) []  - 0 Simple Staple / Suture removal (25 or less) []  - 0 Complex Staple / Suture removal (26 or more) []  - 0 Hypo/Hyperglycemic Management (do not check  if billed separately) X- 1 15 Ankle / Brachial Index (ABI) - do not check if billed separately Has the patient been seen at the hospital within the last three years: Yes Total Score: 125 Level Of Care: New/Established - Level 4 Electronic Signature(s) Signed: 02/23/2023 12:58:46 PM By: Karie Schwalbe RN Entered By: Karie Schwalbe on 02/23/2023 12:40:35 Andrey Campanile ([id number]) [id number] Page 3 of 9 -------------------------------------------------------------------------------- Encounter Discharge Information Details Patient Name: Date of Service: Karen Jackson [place] F. 02/23/2023 8:00 A M Medical Record Number: [id number] Patient Account Number: [id number] Date of Birth/Sex: Treating RN: 21-Jul-1941 (82 y.o. [name] Primary Care : Ezequiel Kayser Other Clinician: Referring : Treating /Extender: Ermalinda Barrios in Treatment: 0 Encounter Discharge Information Items Post Procedure Vitals Discharge Condition: Stable Temperature (F): 98 Ambulatory Status: Wheelchair Pulse (bpm): 71 Discharge Destination: Home Respiratory Rate (breaths/min): 20 Transportation: Private Auto Blood Pressure (mmHg): 164/74 Accompanied By: Niece Schedule Follow-up Appointment: Yes Clinical Summary of Care: Patient Declined Electronic Signature(s) Signed: 02/23/2023 12:58:46 PM By: Karie Schwalbe RN Entered By: Karie Schwalbe on 02/23/2023 12:41:50 -------------------------------------------------------------------------------- Lower Extremity Assessment Details Patient Name: Date of Service: Karen Jackson [place] F. 02/23/2023 8:00 A M Medical  Record Number: [id number] Patient Account Number: [id number] Date of Birth/Sex: Treating RN: Jan 02, 1941 (82 y.o. [name] Primary Care : Ezequiel Kayser Other Clinician: Referring : Treating /Extender: Levell July Weeks in Treatment: 0 Edema Assessment Assessed: [Left: No] [Right: No] [Left: Edema] [Right: :] Calf Left: Right: Point of Measurement: From Medial Instep 30 cm Ankle Left: Right: Point of Measurement: From Medial Instep 19.5 cm Vascular Assessment Pulses: Dorsalis Pedis Palpable: [Left:Yes] Posterior Tibial Palpable: [Left:Yes] Blood Pressure: Brachial: [Left:164] Ankle: [Left:Dorsalis Pedis: 176 1.07] Electronic Signature(s) Signed: 02/23/2023 12:58:46 PM By: Karie Schwalbe RN Andrey Campanile ([id number]) [id number] Page 4 of 9 Signed: 02/23/2023 12:58:46 PM By: Karie Schwalbe RN Entered By: Karie Schwalbe on 02/23/2023 08:52:14 -------------------------------------------------------------------------------- Multi Wound Chart Details Patient Name: Date of Service: Karen [place] F. 02/23/2023 8:00 A M Medical Record Number: [id number] Patient Account Number: [id number] Date of Birth/Sex: Treating RN: [birth year] (82 y.o. F) Primary Care : Ezequiel Kayser Other Clinician: Referring : Treating /Extender: Ermalinda Barrios in Treatment: 0 Vital Signs Height(in): 58 Pulse(bpm): 71 Weight(lbs): 160 Blood Pressure(mmHg): 164/74 Body Mass Index(BMI): 33.4 Temperature(F): 98 Respiratory Rate(breaths/min): 20 [1:Photos:] [N/A:N/A] Left, Lateral Lower Leg Left, Lateral Ankle N/A Wound Location: Gradually Appeared Pressure Injury N/A Wounding Event: Abscess Pressure Ulcer N/A Primary Etiology: Arrhythmia, Congestive Heart Failure, Arrhythmia, Congestive Heart Failure, N/A Comorbid History: Osteoarthritis Osteoarthritis 12/27/2022 02/22/2023  N/A Date Acquired: 0 0 N/A Weeks of Treatment: Open Open N/A Wound Status: No No N/A Wound Recurrence: 0.9x0.7x0.9 0.4x0.8x0.2 N/A Measurements L x W x D (cm) 0.495 0.251 N/A A (cm) : rea 0.445 0.05 N/A Volume (cm) : 12 Starting Position 1 (o'clock): 12 Ending Position 1 (o'clock): 0.6 Maximum Distance 1 (cm): Yes No N/A Undermining: Full Thickness Without Exposed Category/Stage IV N/A Classification: Support Structures Medium Medium N/A Exudate A mount: Serosanguineous Serosanguineous N/A Exudate Type: red, brown red, brown N/A Exudate Color: Distinct, outline attached N/A N/A Wound Margin: Medium (34-66%) Small (1-33%) N/A Granulation A mount: Red, Pink, Hyper-granulation Pink N/A Granulation Quality: Medium (34-66%) Large (67-100%) N/A Necrotic A mount: Adherent Slough Eschar, Adherent Slough N/A Necrotic Tissue: Fat Layer (Subcutaneous Tissue): Yes Fat Layer (Subcutaneous Tissue): Yes N/A Exposed Structures: Fascia: No Bone: Yes Tendon:  No Muscle: No Joint: No Bone: No None Small (1-33%) N/A Epithelialization: Chemical/Enzymatic/Mechanical Chemical/Enzymatic/Mechanical N/A Debridement: Pre-procedure Verification/Time Out 09:10 09:10 N/A Taken: Lidocaine 4% Topical Solution Lidocaine 4% Topical Solution N/A Pain Control: Other(Gauze,Vashe) Other(Gauze,Vashe) N/A Instrument: Minimum Minimum N/A Bleeding: Pressure Pressure N/A Hemostasis A chieved: 0 0 N/A Procedural Pain: 0 0 N/A Post Procedural PainMERCADEZ, ASHLEY ([id number]) [id number] Page 5 of 9 Procedure was tolerated well Procedure was tolerated well N/A Debridement Treatment Response: 0.9x0.7x0.9 0.4x0.8x0.2 N/A Post Debridement Measurements L x W x D (cm) 0.445 0.05 N/A Post Debridement Volume: (cm) Scarring: Yes No Abnormalities Noted N/A Periwound Skin Texture: Excoriation: No Induration: No Callus: No Crepitus: No Rash: No Maceration: No No  Abnormalities Noted N/A Periwound Skin Moisture: Dry/Scaly: No Atrophie Blanche: No No Abnormalities Noted N/A Periwound Skin Color: Cyanosis: No Ecchymosis: No Erythema: No Hemosiderin Staining: No Mottled: No Pallor: No Rubor: No N/A Yes N/A Tenderness on Palpation: Debridement Debridement N/A Procedures Performed: Treatment Notes Electronic Signature(s) Signed: 02/23/2023 11:46:38 AM By: Geralyn Corwin DO Entered By: Geralyn Corwin on 02/23/2023 09:43:51 -------------------------------------------------------------------------------- Multi-Disciplinary Care Plan Details Patient Name: Date of Service: Karen [place] F. 02/23/2023 8:00 A M Medical Record Number: [id number] Patient Account Number: [id number] Date of Birth/Sex: Treating RN: Dec 30, 1940 (82 y.o. [name] Primary Care : Ezequiel Kayser Other Clinician: Referring : Treating /Extender: Ermalinda Barrios in Treatment: 0 Active Inactive Wound/Skin Impairment Nursing Diagnoses: Impaired tissue integrity Goals: Patient/caregiver will verbalize understanding of skin care regimen Date Initiated: 02/23/2023 Target Resolution Date: 05/28/2023 Goal Status: Active Interventions: Assess ulceration(s) every visit Treatment Activities: Skin care regimen initiated : 02/23/2023 Notes: Electronic Signature(s) Signed: 02/23/2023 12:58:46 PM By: Karie Schwalbe RN Entered By: Karie Schwalbe on 02/23/2023 11:19:52 Andrey Campanile ([id number]) [id number] Page 6 of 9 -------------------------------------------------------------------------------- Pain Assessment Details Patient Name: Date of Service: Karen Jackson [place] F. 02/23/2023 8:00 A M Medical Record Number: [id number] Patient Account Number: [id number] Date of Birth/Sex: Treating RN: [birth year] (82 y.o. Arta Silence Primary Care : Ezequiel Kayser Other Clinician: Referring  : Treating /Extender: Ermalinda Barrios in Treatment: 0 Active Problems Location of Pain Severity and Description of Pain Patient Has Paino Yes Site Locations Pain Location: Pain in Ulcers Rate the pain. Current Pain Level: 9 Character of Pain Describe the Pain: Heavy, Sharp Pain Management and Medication Current Pain Management: Medication: No Cold Application: No Rest: No Massage: No Activity: No T.E.N.S.: No Heat Application: No Leg drop or elevation: No Is the Current Pain Management Adequate: Adequate How does your wound impact your activities of daily livingo Sleep: No Bathing: No Appetite: No Relationship With Others: No Bladder Continence: No Emotions: No Bowel Continence: No Work: No Toileting: No Drive: No Dressing: No Hobbies: No Psychologist, prison and probation services) Signed: 02/23/2023 2:14:44 PM By: Shawn Stall RN, BSN Entered By: Shawn Stall on 02/23/2023 08:31:01 -------------------------------------------------------------------------------- Patient/Caregiver Education Details Patient Name: Date of Service: Marcelle Smiling 6/28/2024andnbsp8:00 A M Medical Record Number: [id number] Patient Account Number: [id number] Date of Birth/Gender: Treating RN: 1941/05/25 (82 y.o. [address] Tommas Olp, [place] F ([id number]) [id number] Page 7 of 9 Primary Care Physician: Ezequiel Kayser Other Clinician: Referring Physician: Treating Physician/Extender: Ermalinda Barrios in Treatment: 0 Education Assessment Education Provided To: Patient Education Topics Provided Wound/Skin Impairment: Methods: Explain/Verbal Responses: Return demonstration correctly Electronic Signature(s) Signed: 02/23/2023 12:58:46 PM By: Karie Schwalbe RN Entered By: Karie Schwalbe on 02/23/2023 12:39:38 -------------------------------------------------------------------------------- Wound  Assessment  Details Patient Name: Date of Service: Karen Jackson [place] F. 02/23/2023 8:00 A M Medical Record Number: [id number] Patient Account Number: [id number] Date of Birth/Sex: Treating RN: Jun 28, 1941 (82 y.o. [name], [place] Primary Care : Ezequiel Kayser Other Clinician: Referring : Treating /Extender: Levell July Weeks in Treatment: 0 Wound Status Wound Number: 1 Primary Etiology: Venous Leg Ulcer Wound Location: Left, Lateral Lower Leg Secondary Etiology: Abscess Wounding Event: Gradually Appeared Wound Status: Open Date Acquired: 12/27/2022 Comorbid History: Arrhythmia, Congestive Heart Failure, Osteoarthritis Weeks Of Treatment: 0 Clustered Wound: No Photos Wound Measurements Length: (cm) 0.9 Width: (cm) 0.7 Depth: (cm) 0.9 Area: (cm) 0.495 Volume: (cm) 0.445 % Reduction in Area: % Reduction in Volume: Epithelialization: None Tunneling: No Undermining: Yes Starting Position (o'clock): 12 Ending Position (o'clock): 12 Maximum Distance: (cm) 0.6 Wound Description Classification: Full Thickness Without Exposed Support Structures Wound Margin: Distinct, outline attached DANITA, TAPLIN ([id number]) Exudate Amount: Medium Exudate Type: Serosanguineous Exudate Color: red, brown Foul Odor After Cleansing: No Slough/Fibrino Yes [id number] Page 8 of 9 Wound Bed Granulation Amount: Medium (34-66%) Exposed Structure Granulation Quality: Red, Pink, Hyper-granulation Fascia Exposed: No Necrotic Amount: Medium (34-66%) Fat Layer (Subcutaneous Tissue) Exposed: Yes Necrotic Quality: Adherent Slough Tendon Exposed: No Muscle Exposed: No Joint Exposed: No Bone Exposed: No Periwound Skin Texture Texture Color No Abnormalities Noted: No No Abnormalities Noted: No Callus: No Atrophie Blanche: No Crepitus: No Cyanosis: No Excoriation: No Ecchymosis: No Induration: No Erythema: No Rash: No Hemosiderin Staining:  No Scarring: Yes Mottled: No Pallor: No Moisture Rubor: No No Abnormalities Noted: No Dry / Scaly: No Maceration: No Electronic Signature(s) Signed: 02/23/2023 11:46:38 AM By: Geralyn Corwin DO Signed: 02/23/2023 2:14:44 PM By: Shawn Stall RN, BSN Entered By: Geralyn Corwin on 02/23/2023 09:47:03 -------------------------------------------------------------------------------- Wound Assessment Details Patient Name: Date of Service: Karen [place] F. 02/23/2023 8:00 A M Medical Record Number: [id number] Patient Account Number: [id number] Date of Birth/Sex: Treating RN: 1940/11/04 (82 y.o. F) Primary Care : Ezequiel Kayser Other Clinician: Referring : Treating /Extender: Levell July Weeks in Treatment: 0 Wound Status Wound Number: 2 Primary Etiology: Pressure Ulcer Wound Location: Left, Lateral Ankle Wound Status: Open Wounding Event: Pressure Injury Comorbid History: Arrhythmia, Congestive Heart Failure, Osteoarthritis Date Acquired: 02/22/2023 Weeks Of Treatment: 0 Clustered Wound: No Wound under treatment by  outside of Wound Center Photos Wound Measurements YAILENE, BASSI ([id number]) Length: (cm) 0.4 Width: (cm) 0.8 Depth: (cm) 1 Area: (cm) 0. Volume: (cm) 0. [id number] Page 9 of 9 % Reduction in Area: % Reduction in Volume: Epithelialization: Small (1-33%) 251 Tunneling: No 251 Undermining: No Wound Description Classification: Category/Stage IV Exudate Amount: Medium Exudate Type: Serosanguineous Exudate Color: red, brown Foul Odor After Cleansing: No Slough/Fibrino Yes Wound Bed Granulation Amount: Small (1-33%) Exposed Structure Granulation Quality: Pink Fat Layer (Subcutaneous Tissue) Exposed: Yes Necrotic Amount: Large (67-100%) Bone Exposed: Yes Necrotic Quality: Eschar Periwound Skin Texture Texture Color No Abnormalities Noted: Yes No Abnormalities Noted:  Yes Moisture Temperature / Pain No Abnormalities Noted: Yes Tenderness on Palpation: Yes Electronic Signature(s) Signed: 03/21/2023 11:09:54 AM By: Geralyn Corwin DO Previous Signature: 02/23/2023 11:46:38 AM Version By: Geralyn Corwin DO Previous Signature: 02/23/2023 12:58:46 PM Version By: Karie Schwalbe RN Entered By: Geralyn Corwin on 03/02/2023 12:26:04 -------------------------------------------------------------------------------- Vitals Details Patient Name: Date of Service: Karen [place] F. 02/23/2023 8:00 A M Medical Record Number: [id number] Patient Account Number: [id number] Date of Birth/Sex: Treating RN: 1941/02/24 (82 y.o. F)  Shawn Stall Primary Care : Ezequiel Kayser Other Clinician: Referring : Treating /Extender: Ermalinda Barrios in Treatment: 0 Vital Signs Time Taken: 08:20 Temperature (F): 98 Height (in): 58 Pulse (bpm): 71 Source: Stated Respiratory Rate (breaths/min): 20 Weight (lbs): 160 Blood Pressure (mmHg): 164/74 Source: Stated Reference Range: 80 - 120 mg / dl Body Mass Index (BMI): 33.4 Electronic Signature(s) Signed: 02/23/2023 2:14:44 PM By: Shawn Stall RN, BSN Entered By: Shawn Stall on 02/23/2023 08:30:25

## 2023-02-23 ENCOUNTER — Encounter (HOSPITAL_BASED_OUTPATIENT_CLINIC_OR_DEPARTMENT_OTHER): Payer: Medicare Other | Attending: Internal Medicine | Admitting: Internal Medicine

## 2023-02-23 DIAGNOSIS — L97822 Non-pressure chronic ulcer of other part of left lower leg with fat layer exposed: Secondary | ICD-10-CM | POA: Insufficient documentation

## 2023-02-23 DIAGNOSIS — I69952 Hemiplegia and hemiparesis following unspecified cerebrovascular disease affecting left dominant side: Secondary | ICD-10-CM | POA: Diagnosis not present

## 2023-02-23 DIAGNOSIS — I679 Cerebrovascular disease, unspecified: Secondary | ICD-10-CM | POA: Diagnosis not present

## 2023-02-23 DIAGNOSIS — L89524 Pressure ulcer of left ankle, stage 4: Secondary | ICD-10-CM | POA: Insufficient documentation

## 2023-02-23 DIAGNOSIS — Z7901 Long term (current) use of anticoagulants: Secondary | ICD-10-CM | POA: Insufficient documentation

## 2023-02-23 DIAGNOSIS — Z7985 Long-term (current) use of injectable non-insulin antidiabetic drugs: Secondary | ICD-10-CM | POA: Insufficient documentation

## 2023-02-23 DIAGNOSIS — E11621 Type 2 diabetes mellitus with foot ulcer: Secondary | ICD-10-CM

## 2023-02-23 DIAGNOSIS — E11622 Type 2 diabetes mellitus with other skin ulcer: Secondary | ICD-10-CM | POA: Diagnosis not present

## 2023-02-23 DIAGNOSIS — I87312 Chronic venous hypertension (idiopathic) with ulcer of left lower extremity: Secondary | ICD-10-CM | POA: Diagnosis not present

## 2023-02-23 DIAGNOSIS — I4819 Other persistent atrial fibrillation: Secondary | ICD-10-CM | POA: Insufficient documentation

## 2023-02-23 LAB — AEROBIC CULTURE W GRAM STAIN (SUPERFICIAL SPECIMEN)

## 2023-02-24 LAB — AEROBIC CULTURE W GRAM STAIN (SUPERFICIAL SPECIMEN): Gram Stain: NONE SEEN

## 2023-02-25 LAB — AEROBIC CULTURE W GRAM STAIN (SUPERFICIAL SPECIMEN)

## 2023-02-26 LAB — AEROBIC CULTURE W GRAM STAIN (SUPERFICIAL SPECIMEN)

## 2023-02-26 NOTE — Progress Notes (Signed)
Karen Jackson ([id number]) [id number] Page 1 of 11 Visit Report for 02/23/2023 Chief Complaint Document Details Patient Name: Date of Service: Karen Jackson [place] F. 02/23/2023 8:00 A M Medical Record Number: [id number] Patient Account Number: [id number] Date of Birth/Sex: Treating RN: [birth year] (82 y.o. F) Primary Care Provider: Ezequiel Jackson Other Clinician: Referring Provider: Treating Provider/Extender: Karen Jackson in Treatment: 0 Information Obtained from: Patient Chief Complaint 02/23/2023; left lateral leg wound, left lateral ankle wound Electronic Signature(s) Signed: 02/23/2023 11:46:38 AM By: Karen Corwin DO Entered By: Karen Jackson on 02/23/2023 09:44:18 -------------------------------------------------------------------------------- Debridement Details Patient Name: Date of Service: Karen Jackson [place] F. 02/23/2023 8:00 A M Medical Record Number: [id number] Patient Account Number: [id number] Date of Birth/Sex: Treating RN: 1941/08/14 (82 y.o. Karen Jackson Primary Care Provider: Ezequiel Jackson Other Clinician: Referring Provider: Treating Provider/Extender: Karen Jackson in Treatment: 0 Debridement Performed for Assessment: Wound #2 Left,Lateral Ankle Performed By: Physician Karen Corwin, DO Debridement Type: Chemical/Enzymatic/Mechanical Agent Used: gauze and Vashe Level of Consciousness (Pre-procedure): Awake and Alert Pre-procedure Verification/Time Out Yes - 09:10 Taken: Start Time: 09:10 Pain Control: Lidocaine 4% Topical Solution Percent of Wound Bed Debrided: Instrument: Other : Gauze,Vashe Bleeding: Minimum Hemostasis Achieved: Pressure End Time: 09:11 Procedural Pain: 0 Post Procedural Pain: 0 Response to Treatment: Procedure was tolerated well Level of Consciousness (Post- Awake and Alert procedure): Post Debridement Measurements of Total Wound Length: (cm) 0.4 Width:  (cm) 0.8 Depth: (cm) 0.2 Volume: (cm) 0.05 Character of Wound/Ulcer Post Debridement: Improved Post Procedure Diagnosis Same as Pre-procedure Karen Jackson, Karen Jackson ([id number]) [id number] Page 2 of 11 Notes Scribed for Dr. Mikey Bussing by Karen Jackson Electronic Signature(s) Signed: 02/23/2023 11:46:38 AM By: Karen Corwin DO Signed: 02/23/2023 12:58:46 PM By: Karen Schwalbe RN Entered By: Karen Jackson on 02/23/2023 09:14:42 -------------------------------------------------------------------------------- Debridement Details Patient Name: Date of Service: Karen Jackson [place] F. 02/23/2023 8:00 A M Medical Record Number: [id number] Patient Account Number: [id number] Date of Birth/Sex: Treating RN: [birth year] (82 y.o. Karen Jackson Primary Care Provider: Ezequiel Jackson Other Clinician: Referring Provider: Treating Provider/Extender: Karen Jackson in Treatment: 0 Debridement Performed for Assessment: Wound #1 Left,Lateral Lower Leg Performed By: Physician Karen Corwin, DO Debridement Type: Chemical/Enzymatic/Mechanical Agent Used: gauze and Vashe Level of Consciousness (Pre-procedure): Awake and Alert Pre-procedure Verification/Time Out Yes - 09:10 Taken: Start Time: 09:10 Pain Control: Lidocaine 4% Topical Solution Percent of Wound Bed Debrided: Instrument: Other : Gauze,Vashe Bleeding: Minimum Hemostasis Achieved: Pressure End Time: 09:11 Procedural Pain: 0 Post Procedural Pain: 0 Response to Treatment: Procedure was tolerated well Level of Consciousness (Post- Awake and Alert procedure): Post Debridement Measurements of Total Wound Length: (cm) 0.9 Width: (cm) 0.7 Depth: (cm) 0.9 Volume: (cm) 0.445 Character of Wound/Ulcer Post Debridement: Improved Post Procedure Diagnosis Same as Pre-procedure Notes Scribed for Dr. Mikey Bussing by Karen Jackson Karen Jackson Culture taken Electronic Signature(s) Signed: 02/23/2023 11:46:38 AM By:  Karen Corwin DO Signed: 02/23/2023 12:58:46 PM By: Karen Schwalbe RN Entered By: Karen Jackson on 02/23/2023 09:15:54 -------------------------------------------------------------------------------- HPI Details Patient Name: Date of Service: Karen Jackson [place] F. 02/23/2023 8:00 A M Medical Record Number: [id number] Patient Account Number: [id number] Karen Jackson, Karen Jackson ([id number]) [id number] Page 3 of 11 Date of Birth/Sex: Treating RN: March 20, 1941 (82 y.o. 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 T the left leg she has been following her primary care physician office for this issue. She is not quite sure how it started. o She has had 2 rounds of doxycycline. Currently she is not doing any wound care dressing but keeping the area covered. The left lateral ankle wound has been an ongoing issue for the patient over the past year. She follows with Dr. Victorino Jackson who did a left ankle ORIF on 07/20/2018. She is had waxing and waning in wound healing to this area. She reports chronic pain to this site however denies any increased warmth, erythema or purulent drainage. Electronic Signature(s) Signed: 02/23/2023 11:46:38 AM By: Karen Corwin DO Entered By: Karen Jackson on 02/23/2023 09:51:22 -------------------------------------------------------------------------------- Physical Exam Details Patient Name: Date of Service: Karen Jackson [place] F.  02/23/2023 8:00 A M Medical Record Number: [id number] Patient Account Number: [id number] Date of Birth/Sex: Treating RN: 1940/09/04 (82 y.o. F) Primary Care Provider: Ezequiel Jackson Other Clinician: Referring Provider: Treating Provider/Extender: Karen Jackson in Treatment: 0 Constitutional respirations regular, non-labored and within target range for patient.. Cardiovascular 2+ dorsalis pedis/posterior tibialis pulses. Psychiatric pleasant and cooperative. Notes Left lower extremity: T the lateral aspect of the leg there is an open wound with serosanguineous drainage and scant thick yellow drainage. Evidence of o venous stasis dermatitis. T the left lateral ankle there is an open pinpoint area that probes straight to bone/possible metal plate. No signs of active soft tissue o infection including increased warmth, erythema purulent drainage. Electronic Signature(s) Signed: 02/23/2023 11:46:38 AM By: Karen Corwin DO Entered By: Karen Jackson on 02/23/2023 09:52:51 -------------------------------------------------------------------------------- Physician Orders Details Patient Name: Date of Service: Karen Jackson [place] F. 02/23/2023 8:00 A M Medical Record Number: [id number] Patient Account Number: [id number] Date of Birth/Sex: Treating RN: [birth year] (82 y.o. Karen Jackson Primary Care Provider: Ezequiel Jackson Other Clinician: Referring Provider: Treating Provider/Extender: Karen Jackson in Treatment: 0 Verbal / Phone Orders: No Diagnosis [address] JAYLEEN, FIXICO ([id number]) [id number] Page 4 of 11 ICD-10 Coding Code Description [phone] Non-pressure chronic ulcer of other part of left lower leg with fat layer exposed I87.312 Chronic venous hypertension (idiopathic) with ulcer of left lower extremity L89.524 Pressure ulcer of left ankle, stage 4 E11.621 Type 2 diabetes mellitus with foot ulcer I67.9  Cerebrovascular disease, unspecified I69.952 Hemiplegia and hemiparesis following unspecified cerebrovascular disease affecting left dominant side I48.19 Other persistent atrial fibrillation Z79.01 Long term (current) use of anticoagulants Follow-up Appointments ppointment in 1 week. - Dr. Mikey Bussing Return A Other: - Please pick up Mupirocin at Pharmacy Anesthetic (In clinic) Topical Lidocaine 4% applied to wound bed Bathing/ Shower/ Hygiene Jackson shower and wash wound with soap and water. Edema Control - Lymphedema / SCD / Other Bilateral Lower Extremities Elevate legs to the level of the heart or above for 30 minutes daily and/or when sitting for 3-4 times a day throughout the day. Avoid standing for long periods of time. Off-Loading Other: - Wear Bunny Boot/Prevalon Boot when in bed/sitting for long periods of time. Try and limit wearing left leg brace. Use the Tubigrip for now  on the left leg and do not wear compression hose for now (02/23/23) Wound Treatment Wound #1 - Lower Leg Wound Laterality: Left, Lateral Cleanser: Vashe 5.8 (oz) (DME) (Generic) 1 x Per Day/30 Days Discharge Instructions: Cleanse the wound with Vashe prior to applying a clean dressing using gauze sponges, not tissue or cotton balls. Topical: Mupirocin Ointment (Generic) 1 x Per Day/30 Days Discharge Instructions: Apply Mupirocin (Bactroban) first then place Hydrafera blue Ready Prim Dressing: Hydrofera Blue Ready Transfer Foam, 2.5x2.5 (in/in) (DME) (Generic) 1 x Per Day/30 Days ary Discharge Instructions: Apply directly to wound bed as directed Secondary Dressing: ALLEVYN Gentle Border, 3x3 (in/in) (DME) (Generic) 1 x Per Day/30 Days Discharge Instructions: Apply over Mupirocin and Hydrafera Blue Secured With: Tubigrip Size D, 3x10 (in/yd) (DME) (Generic) 1 x Per Day/30 Days Discharge Instructions: Wear during the day and take off at night Wound #2 - Ankle Wound Laterality: Left, Lateral Cleanser: Vashe 5.8  (oz) (DME) (Generic) 1 x Per Day/30 Days Discharge Instructions: Cleanse the wound with Vashe prior to applying a clean dressing using gauze sponges, not tissue or cotton balls. Topical: Mupirocin Ointment 1 x Per Day/30 Days Discharge Instructions: Apply Mupirocin (Bactroban) first then place Hydrafera blue Ready Prim Dressing: Hydrofera Blue Ready Transfer Foam, 2.5x2.5 (in/in) (DME) (Generic) 1 x Per Day/30 Days ary Discharge Instructions: Apply over Mupirocin then apply Hydrafera Blue Secondary Dressing: Bordered Gauze, 2x2 in (DME) (Generic) 1 x Per Day/30 Days Discharge Instructions: Apply over primary dressing Hydrafera Blue Secured With: Tubigrip Size D, 3x10 (in/yd) (DME) (Generic) 1 x Per Day/30 Days Discharge Instructions: Wear during the day and take off at night Laboratory naerobe culture (MICRO) - Left Lateral lower leg - (ICD10 Z61.096 - Non-pressure chronic ulcer Bacteria identified in Unspecified specimen by A of other part of left lower leg with fat layer exposed) LOINC Code: 635-3 Convenience Name: Anaerobic culture Radiology Karen Jackson, Karen Jackson ([id number]) [id number] Page 5 of 11 nkle with Contrast - Left Ankle - (ICD10 B14.782 - Hemiplegia and hemiparesis following unspecified Computed Tomography (CT) Scan , Left A cerebrovascular disease affecting left dominant side) Electronic Signature(s) Signed: 02/23/2023 11:46:38 AM By: Karen Corwin DO Signed: 02/23/2023 12:58:46 PM By: Karen Schwalbe RN Previous Signature: 02/23/2023 9:22:53 AM Version By: Karen Corwin DO Entered By: Karen Jackson on 02/23/2023 11:19:13 Prescription 02/23/2023 -------------------------------------------------------------------------------- Andrey Campanile. Karen Corwin DO Patient Name: Provider: [id number]-Jackson-1942 [id number] Date of Birth: NPI#: [id number] Sex: DEA #: [phone] Phone #: License #: [employer] Signs: Patient Address: [address] Eligha Bridegroom  [facility] [place], [place] [address] [address] [place], [place] [address] [phone] Allergies No Known Drug Allergies Provider's Orders nkle with Contrast - ICD10: R51.884 - Left Ankle Computed Tomography (CT) Scan , Left A Hand Signature: Date(s): Prescription 02/23/2023 Andrey Campanile. Karen Corwin DO Patient Name: Provider: 1940-10-22 [id number] Date of Birth: NPI#: [id number] Sex: DEA #: [phone] Phone #: License #: [employer] Signs: Patient Address: [address] Eligha Bridegroom [facility]ound [place], [place] [address] [address] [place], [place] [address] [phone] Allergies No Known Drug Allergies Provider's Orders naerobe culture - ICD10: L97.822 - Left Lateral lower leg Bacteria identified in Unspecified specimen by A LOINC Code: 635-3 Convenience Name: Anaerobic culture Hand Signature: Date(s): Electronic Signature(s) Signed: 02/23/2023 11:46:38 AM By: Karen Corwin DO Signed: 02/23/2023 12:58:46 PM By: Karen Schwalbe RN Entered By: Karen Jackson on 02/23/2023 11:19:13 Andrey Campanile ([id number]) [id number] Page  6 of 11 -------------------------------------------------------------------------------- Problem List Details Patient Name: Date of Service: Karen Jackson [place] F. 02/23/2023 8:00 A M Medical Record Number: [id number] Patient Account Number: [id number] Date of Birth/Sex: Treating RN: [birth year] (82 y.o. F) Primary Care Provider: Ezequiel Jackson Other Clinician: Referring Provider: Treating Provider/Extender: Karen Jackson in Treatment: 0 Active Problems ICD-10 Encounter Code Description Active Date MDM Diagnosis [phone] Non-pressure chronic ulcer of other part of left lower leg with fat layer exposed 02/23/2023 No Yes I87.312 Chronic venous hypertension (idiopathic) with ulcer of left lower extremity 02/23/2023  No Yes L89.524 Pressure ulcer of left ankle, stage 4 02/23/2023 No Yes E11.621 Type 2 diabetes mellitus with foot ulcer 02/23/2023 No Yes I67.9 Cerebrovascular disease, unspecified 02/23/2023 No Yes I69.952 Hemiplegia and hemiparesis following unspecified cerebrovascular disease 02/23/2023 No Yes affecting left dominant side I48.19 Other persistent atrial fibrillation 02/23/2023 No Yes Z79.01 Long term (current) use of anticoagulants 02/23/2023 No Yes Inactive Problems Resolved Problems Electronic Signature(s) Signed: 02/23/2023 11:46:38 AM By: Karen Corwin DO Entered By: Karen Jackson on 02/23/2023 09:43:36 -------------------------------------------------------------------------------- Progress Note Details Patient Name: Date of Service: Karen Jackson [place] F. 02/23/2023 8:00 A Karalee Height ([id number]) [id number] Page 7 of 11 Medical Record Number: [id number] Patient Account Number: [id number] Date of Birth/Sex: Treating RN: 08-13-1941 (82 y.o. F) Primary Care Provider: Ezequiel Jackson Other Clinician: Referring Provider: Treating Provider/Extender: Karen Jackson in Treatment: 0 Subjective Chief Complaint Information obtained from Patient 02/23/2023; left lateral leg wound, left lateral ankle wound History of Present Illness (HPI) 02/23/2023 Ms. 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 T the left leg she has been following her primary care physician office for this issue. She is not quite sure how it started. o She has had 2 rounds of doxycycline. Currently she is not doing any wound care dressing but keeping the area  covered. The left lateral ankle wound has been an ongoing issue for the patient over the past year. She follows with Dr. Victorino Jackson who did a left ankle ORIF on 07/20/2018. She is had waxing and waning in wound healing to this area. She reports chronic pain to this site however denies any increased warmth, erythema or purulent drainage. Patient History Information obtained from Patient, Chart. Allergies No Known Drug Allergies Family History Cancer - Mother,Siblings. Social History Never smoker, Marital Status - Widowed, Alcohol Use - Never, Drug Use - No History, Caffeine Use - Never. Medical History Cardiovascular Patient has history of Arrhythmia - Hx A. fib, Congestive Heart Failure Endocrine Denies history of Type II Diabetes Musculoskeletal Patient has history of Osteoarthritis Hospitalization/Surgery History - bilat knee replacement. - right ankle replacement. Medical A Surgical History Notes nd Constitutional Symptoms (General Health) Stroke Respiratory pulmonary HTN Gastrointestinal GERD DIVERTICULITIS Neurologic hemiparesis left side late affect of CVA Review of Systems (ROS) Constitutional Symptoms (General Health) Complains or has symptoms of Fatigue. Genitourinary Denies complaints or symptoms of Frequent urination. Objective Constitutional respirations regular, non-labored and within target range for patient.. Vitals Time Taken: 8:20 AM, Height: 58 in, Source: Stated, Weight: 160 lbs, Source: Stated, BMI: 33.4, Temperature: 98 F,  Pulse: 71 bpm, Respiratory Rate: 20 breaths/min, Blood Pressure: 164/74 mmHg. Cardiovascular 2+ dorsalis pedis/posterior tibialis pulses. Psychiatric pleasant and cooperative. General Notes: Left lower extremity: T the lateral aspect of the leg there is an open wound with serosanguineous drainage and scant thick yellow drainage. o Evidence of venous stasis dermatitis. T the left lateral ankle there is an open pinpoint area that probes  straight to bone/possible metal plate. No signs of o active soft tissue infection including increased warmth, erythema purulent drainage. Karen Jackson, Karen Jackson ([id number]) [id number] Page 8 of 11 Integumentary (Hair, Skin) Wound #1 status is Open. Original cause of wound was Gradually Appeared. The date acquired was: 12/27/2022. The wound is located on the Left,Lateral Lower Leg. The wound measures 0.9cm length x 0.7cm width x 0.9cm depth; 0.495cm^2 area and 0.445cm^3 volume. There is Fat Layer (Subcutaneous Tissue) exposed. There is no tunneling noted, however, there is undermining starting at 12:00 and ending at 12:00 with a maximum distance of 0.6cm. There is a medium amount of serosanguineous drainage noted. The wound margin is distinct with the outline attached to the wound base. There is medium (34-66%) red, pink, hyper - granulation within the wound bed. There is a medium (34-66%) amount of necrotic tissue within the wound bed including Adherent Slough. The periwound skin appearance exhibited: Scarring. The periwound skin appearance did not exhibit: Callus, Crepitus, Excoriation, Induration, Rash, Dry/Scaly, Maceration, Atrophie Blanche, Cyanosis, Ecchymosis, Hemosiderin Staining, Mottled, Pallor, Rubor, Erythema. Wound #2 status is Open. Original cause of wound was Pressure Injury. The date acquired was: 02/22/2023. The wound is located on the Left,Lateral Ankle. The wound measures 0.4cm length x 0.8cm width x 0.2cm depth; 0.251cm^2 area and 0.05cm^3 volume. There is bone and Fat Layer (Subcutaneous Tissue) exposed. There is no tunneling or undermining noted. There is a medium amount of serosanguineous drainage noted. There is small (1-33%) pink granulation within the wound bed. There is a large (67-100%) amount of necrotic tissue within the wound bed including Eschar and Adherent Slough. The periwound skin appearance had no abnormalities noted for texture. The periwound skin  appearance had no abnormalities noted for moisture. The periwound skin appearance had no abnormalities noted for color. The periwound has tenderness on palpation. Assessment Active Problems ICD-10 Non-pressure chronic ulcer of other part of left lower leg with fat layer exposed Chronic venous hypertension (idiopathic) with ulcer of left lower extremity Pressure ulcer of left ankle, stage 4 Type 2 diabetes mellitus with foot ulcer Cerebrovascular disease, unspecified Hemiplegia and hemiparesis following unspecified cerebrovascular disease affecting left dominant side Other persistent atrial fibrillation Long term (current) use of anticoagulants Patient presents with a 1 month history of nonhealing ulcer to the left lateral leg secondary to likely friction/trauma/abscess formation from her leg brace and nonhealing due to venous insufficiency. I obtained a wound culture and recommended Hydrofera Blue dressing changes. Also recommended she only use the brace as needed when walking. We will give her Tubigrip to help with some compression. I recommended she hold off on her compression stockings as they are very tight on her and she does not have significant edema on exam. T the left lateral ankle there is a pinpoint open area that probes straight to o bone/possible metal plate from previous ORIF. She will need to follow back up with Dr. Victorino Jackson for this issue. For now we will obtain a CT scan for further evaluation. Due to the chronicity of the wound of 1 year and the fact that probes straight to either hardware or bone  I have made patient aware that this will likely not heal with my treatment. She expressed understanding. I also recommended she wear a Prevalon boot for pressure relief when she is not walking. She knows to not ambulate in this. ABIs in office were 1.07 and she has strong palpable pedal pulses suggesting adequate blood flow for healing. Procedures Wound #1 Pre-procedure diagnosis of  Wound #1 is an Abscess located on the Left,Lateral Lower Leg . There was a Chemical/Enzymatic/Mechanical debridement performed by Karen Corwin, DO. With the following instrument(s): Gauze,Vashe after achieving pain control using Lidocaine 4% T opical Solution. Other agent used was gauze and Vashe. A time out was conducted at 09:10, prior to the start of the procedure. A Minimum amount of bleeding was controlled with Pressure. The procedure was tolerated well with a pain level of 0 throughout and a pain level of 0 following the procedure. Post Debridement Measurements: 0.9cm length x 0.7cm width x 0.9cm depth; 0.445cm^3 volume. Character of Wound/Ulcer Post Debridement is improved. Post procedure Diagnosis Wound #1: Same as Pre-Procedure General Notes: Scribed for Dr. Mikey Bussing by Karen Jackson Bowlegs Culture taken. Wound #2 Pre-procedure diagnosis of Wound #2 is a Trauma, Other located on the Left,Lateral Ankle . There was a Chemical/Enzymatic/Mechanical debridement performed by Karen Corwin, DO. With the following instrument(s): Gauze,Vashe after achieving pain control using Lidocaine 4% T opical Solution. Other agent used was gauze and Vashe. A time out was conducted at 09:10, prior to the start of the procedure. A Minimum amount of bleeding was controlled with Pressure. The procedure was tolerated well with a pain level of 0 throughout and a pain level of 0 following the procedure. Post Debridement Measurements: 0.4cm length x 0.8cm width x 0.2cm depth; 0.05cm^3 volume. Character of Wound/Ulcer Post Debridement is improved. Post procedure Diagnosis Wound #2: Same as Pre-Procedure General Notes: Scribed for Dr. Mikey Bussing by Karen Jackson. Plan Follow-up Appointments: Return Appointment in 1 week. - Dr. Mikey Bussing Other: - Please pick up Mupirocin at Pharmacy Anesthetic: (In clinic) Topical Lidocaine 4% applied to wound bed Bathing/ Shower/ Hygiene: Jackson shower and wash wound with soap and  water. Edema Control - Lymphedema / SCD / Other: Elevate legs to the level of the heart or above for 30 minutes daily and/or when sitting for 3-4 times a day throughout the day. Avoid standing for long periods of time. Off-LoadingSANTIANA, Karen Jackson ([id number]) [id number] Page 9 of 11 Other: - Wear Bunny Boot/Prevalon Boot when in bed/sitting for long periods of time. Try and limit wearing left leg brace. Use the Tubigrip for now on the left leg and do not wear compression hose for now (02/23/23) Radiology ordered were: Computed Tomography (CT) Scan , Left Ankle with Contrast - Left Ankle Laboratory ordered were: Anaerobic culture - Left Lateral lower leg WOUND #1: - Lower Leg Wound Laterality: Left, Lateral Cleanser: Vashe 5.8 (oz) (DME) (Generic) 1 x Per Day/30 Days Discharge Instructions: Cleanse the wound with Vashe prior to applying a clean dressing using gauze sponges, not tissue or cotton balls. Topical: Mupirocin Ointment (Generic) 1 x Per Day/30 Days Discharge Instructions: Apply Mupirocin (Bactroban) first then place Hydrafera blue Ready Prim Dressing: Hydrofera Blue Ready Transfer Foam, 2.5x2.5 (in/in) (DME) (Generic) 1 x Per Day/30 Days ary Discharge Instructions: Apply directly to wound bed as directed Secondary Dressing: ALLEVYN Gentle Border, 3x3 (in/in) (DME) (Generic) 1 x Per Day/30 Days Discharge Instructions: Apply over Mupirocin and Hydrafera Blue Secured With: Tubigrip Size D, 3x10 (in/yd) (DME) (Generic) 1 x Per Day/30  Days Discharge Instructions: Wear during the day and take off at night WOUND #2: - Ankle Wound Laterality: Left, Lateral Cleanser: Vashe 5.8 (oz) (DME) (Generic) 1 x Per Day/30 Days Discharge Instructions: Cleanse the wound with Vashe prior to applying a clean dressing using gauze sponges, not tissue or cotton balls. Topical: Mupirocin Ointment 1 x Per Day/30 Days Discharge Instructions: Apply Mupirocin (Bactroban) first then  place Hydrafera blue Ready Prim Dressing: Hydrofera Blue Ready Transfer Foam, 2.5x2.5 (in/in) (DME) (Generic) 1 x Per Day/30 Days ary Discharge Instructions: Apply over Mupirocin then apply Hydrafera Blue Secondary Dressing: Bordered Gauze, 2x2 in (DME) (Generic) 1 x Per Day/30 Days Discharge Instructions: Apply over primary dressing Hydrafera Blue Secured With: Tubigrip Size D, 3x10 (in/yd) (DME) (Generic) 1 x Per Day/30 Days Discharge Instructions: Wear during the day and take off at night 1. Wound culture 2. Hydrofera Blue and mupirocin ointment 3. CT scan of the left ankle 4. Follow-up in 1 week Electronic Signature(s) Signed: 02/27/2023 5:27:02 PM By: Shawn Stall RN, BSN Signed: 02/28/2023 10:00:40 AM By: Karen Corwin DO Previous Signature: 02/23/2023 11:46:38 AM Version By: Karen Corwin DO Entered By: Shawn Stall on 02/27/2023 17:26:16 -------------------------------------------------------------------------------- HxROS Details Patient Name: Date of Service: Karen Jackson [place] F. 02/23/2023 8:00 A M Medical Record Number: [id number] Patient Account Number: [id number] Date of Birth/Sex: Treating RN: [birth year] (82 y.o. F) Primary Care Provider: Ezequiel Jackson Other Clinician: Referring Provider: Treating Provider/Extender: Karen Jackson in Treatment: 0 Information Obtained From Patient Chart Constitutional Symptoms (General Health) Complaints and Symptoms: Positive for: Fatigue Medical History: Past Medical History Notes: Stroke Genitourinary Complaints and Symptoms: Negative for: Frequent urination Respiratory Medical History: Past Medical History Notes: pulmonary HTN SIHAM, STONER ([id number]) [id number] Page 10 of 11 Cardiovascular Medical History: Positive for: Arrhythmia - Hx A. 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 Immunizations Pneumococcal Vaccine: Received Pneumococcal Vaccination: Yes Received Pneumococcal Vaccination On or After 60th Birthday: Yes Implantable Devices No devices added Hospitalization / Surgery History Type of Hospitalization/Surgery bilat knee replacement right ankle replacement Family and Social History Cancer: Yes - Mother,Siblings; Never smoker; Marital Status - Widowed; Alcohol Use: Never; Drug Use: No History; Caffeine Use: Never; Financial Concerns: No; Food, Clothing or Shelter Needs: No; Support System Lacking: No; Transportation Concerns: No Electronic Signature(s) Signed: 02/23/2023 11:46:38 AM By: Karen Corwin DO Signed: 02/23/2023 2:14:44 PM By: Shawn Stall RN, BSN Entered By: Shawn Stall on 02/23/2023 08:28:46 -------------------------------------------------------------------------------- SuperBill Details Patient Name: Date of Service: Karen Jackson NE F. 02/23/2023 Medical Record Number: [id number] Patient Account Number: [id number] Date of Birth/Sex: Treating RN: [birth year] (82 y.o. F) Primary Care Provider: Ezequiel Jackson Other Clinician: Referring Provider: Treating Provider/Extender: Karen Jackson in Treatment: 0 Diagnosis Coding ICD-10 Codes Code Description [phone] Non-pressure chronic ulcer of other part of left lower leg with fat layer exposed I87.312 Chronic venous hypertension (idiopathic) with ulcer of left lower extremity Karen Jackson, Karen Jackson ([id number]) [id number] Page 11 of 11 L89.524 Pressure ulcer of left ankle, stage 4 E11.621 Type 2 diabetes mellitus with foot ulcer I67.9 Cerebrovascular disease, unspecified I69.952 Hemiplegia and  hemiparesis following unspecified cerebrovascular disease affecting left dominant side I48.19 Other persistent atrial fibrillation Z79.01 Long term (current) use of anticoagulants Facility Procedures : CPT4 Code: [id number] Description:  99214 - WOUND CARE VISIT-LEV 4 EST PT Modifier: Quantity: 1 : CPT4 Code: 57846962 Description: 95284 - DEBRIDE W/O ANES NON SELECT Modifier: Quantity: 1 Physician Procedures : CPT4 Code Description Modifier [id number] 99204 - WC PHYS LEVEL 4 - NEW PT ICD-10 Diagnosis Description L97.822 Non-pressure chronic ulcer of other part of left lower leg with fat layer exposed I87.312 Chronic venous hypertension (idiopathic) with ulcer  of left lower extremity L89.524 Pressure ulcer of left ankle, stage 4 E11.621 Type 2 diabetes mellitus with foot ulcer Quantity: 1 Electronic Signature(s) Signed: 02/23/2023 12:58:46 PM By: Karen Schwalbe RN Signed: 02/26/2023 10:26:47 AM By: Karen Corwin DO Previous Signature: 02/23/2023 11:46:38 AM Version By: Karen Corwin DO Entered By: Karen Jackson on 02/23/2023 12:40:47

## 2023-02-27 LAB — AEROBIC CULTURE W GRAM STAIN (SUPERFICIAL SPECIMEN)

## 2023-03-02 ENCOUNTER — Other Ambulatory Visit (HOSPITAL_COMMUNITY): Payer: Self-pay | Admitting: Internal Medicine

## 2023-03-02 ENCOUNTER — Encounter (HOSPITAL_BASED_OUTPATIENT_CLINIC_OR_DEPARTMENT_OTHER): Payer: Medicare Other | Attending: Internal Medicine | Admitting: Internal Medicine

## 2023-03-02 DIAGNOSIS — Z7985 Long-term (current) use of injectable non-insulin antidiabetic drugs: Secondary | ICD-10-CM | POA: Insufficient documentation

## 2023-03-02 DIAGNOSIS — E11622 Type 2 diabetes mellitus with other skin ulcer: Secondary | ICD-10-CM | POA: Insufficient documentation

## 2023-03-02 DIAGNOSIS — J439 Emphysema, unspecified: Secondary | ICD-10-CM | POA: Insufficient documentation

## 2023-03-02 DIAGNOSIS — L97822 Non-pressure chronic ulcer of other part of left lower leg with fat layer exposed: Secondary | ICD-10-CM | POA: Insufficient documentation

## 2023-03-02 DIAGNOSIS — I69354 Hemiplegia and hemiparesis following cerebral infarction affecting left non-dominant side: Secondary | ICD-10-CM | POA: Diagnosis not present

## 2023-03-02 DIAGNOSIS — Z7901 Long term (current) use of anticoagulants: Secondary | ICD-10-CM | POA: Insufficient documentation

## 2023-03-02 DIAGNOSIS — Z96661 Presence of right artificial ankle joint: Secondary | ICD-10-CM | POA: Insufficient documentation

## 2023-03-02 DIAGNOSIS — I509 Heart failure, unspecified: Secondary | ICD-10-CM | POA: Diagnosis not present

## 2023-03-02 DIAGNOSIS — I87312 Chronic venous hypertension (idiopathic) with ulcer of left lower extremity: Secondary | ICD-10-CM | POA: Diagnosis not present

## 2023-03-02 DIAGNOSIS — I4819 Other persistent atrial fibrillation: Secondary | ICD-10-CM | POA: Diagnosis not present

## 2023-03-02 DIAGNOSIS — E11621 Type 2 diabetes mellitus with foot ulcer: Secondary | ICD-10-CM | POA: Diagnosis not present

## 2023-03-02 DIAGNOSIS — I272 Pulmonary hypertension, unspecified: Secondary | ICD-10-CM | POA: Insufficient documentation

## 2023-03-02 DIAGNOSIS — S91002D Unspecified open wound, left ankle, subsequent encounter: Secondary | ICD-10-CM

## 2023-03-02 DIAGNOSIS — I69952 Hemiplegia and hemiparesis following unspecified cerebrovascular disease affecting left dominant side: Secondary | ICD-10-CM

## 2023-03-02 DIAGNOSIS — G8929 Other chronic pain: Secondary | ICD-10-CM | POA: Diagnosis not present

## 2023-03-02 DIAGNOSIS — L89524 Pressure ulcer of left ankle, stage 4: Secondary | ICD-10-CM | POA: Diagnosis not present

## 2023-03-02 DIAGNOSIS — M1990 Unspecified osteoarthritis, unspecified site: Secondary | ICD-10-CM | POA: Diagnosis not present

## 2023-03-02 NOTE — Progress Notes (Signed)
RHANDI, KOFLER ([id number]) [id number] Page 1 of 8 Visit Report for 03/02/2023 Arrival Information Details Patient Name: Date of Service: Karen Jackson [place] F. 03/02/2023 11:45 A M Medical Record Number: [id number] Patient Account Number: [id number] Date of Birth/Sex: Treating RN: January 29, 1941 (82 y.o. F) Primary Care : Ezequiel Kayser Other Clinician: Referring : Treating /Extender: Ermalinda Barrios in Treatment: 1 Visit Information History Since Last Visit Added or deleted any medications: No Patient Arrived: Wheel Chair Any new allergies or adverse reactions: No Arrival Time: 11:39 Had a fall or experienced change in No Accompanied By: caregiver activities of daily living that may affect Transfer Assistance: None risk of falls: Patient Identification Verified: Yes Signs or symptoms of abuse/neglect since last visito No Secondary Verification Process Completed: Yes Hospitalized since last visit: No Patient Requires Transmission-Based Precautions: No Implantable device outside of the clinic excluding No Patient Has Alerts: Yes cellular tissue based products placed in the center Patient Alerts: Patient on Blood Thinner since last visit: Eliquis Has Dressing in Place as Prescribed: Yes Has Footwear/Offloading in Place as Prescribed: Yes Left: Other:CAM Boot Pain Present Now: Yes Electronic Signature(s) Signed: 03/02/2023 12:18:22 PM By: Thayer Dallas Entered By: Thayer Dallas on 03/02/2023 11:44:31 -------------------------------------------------------------------------------- Encounter Discharge Information Details Patient Name: Date of Service: Karen Jackson [place] F. 03/02/2023 11:45 A M Medical Record Number: [id number] Patient Account Number: [id number] Date of Birth/Sex: Treating RN: 05-May-1941 (82 y.o. [name] Primary Care : Ezequiel Kayser Other Clinician: Referring : Treating  /Extender: Ermalinda Barrios in Treatment: 1 Encounter Discharge Information Items Discharge Condition: Stable Ambulatory Status: Wheelchair Discharge Destination: Home Transportation: Private Auto Accompanied By: caregiver Schedule Follow-up Appointment: Yes Clinical Summary of Care: Patient Declined Electronic Signature(s) Signed: 03/02/2023 1:37:42 PM By: Karie Schwalbe RN Entered By: Karie Schwalbe on 03/02/2023 12:48:25 Andrey Campanile ([id number]) [id number] Page 2 of 8 -------------------------------------------------------------------------------- Lower Extremity Assessment Details Patient Name: Date of Service: Karen Jackson [place] F. 03/02/2023 11:45 A M Medical Record Number: [id number] Patient Account Number: [id number] Date of Birth/Sex: Treating RN: 1940/09/26 (82 y.o. [name] Primary Care : Ezequiel Kayser Other Clinician: Referring : Treating /Extender: Levell July Weeks in Treatment: 1 Edema Assessment Assessed: [Left: No] [Right: No] [Left: Edema] [Right: :] Calf Left: Right: Point of Measurement: From Medial Instep 32.5 cm Ankle Left: Right: Point of Measurement: From Medial Instep 20.4 cm Vascular Assessment Pulses: Dorsalis Pedis Palpable: [Left:Yes] Electronic Signature(s) Signed: 03/02/2023 1:37:42 PM By: Karie Schwalbe RN Entered By: Karie Schwalbe on 03/02/2023 12:04:57 -------------------------------------------------------------------------------- Multi Wound Chart Details Patient Name: Date of Service: Karen Jackson NE F. 03/02/2023 11:45 A M Medical Record Number: [id number] Patient Account Number: [id number] Date of Birth/Sex: Treating RN: 05-22-1941 (82 y.o. F) Primary Care : Ezequiel Kayser Other Clinician: Referring : Treating /Extender: Ermalinda Barrios in Treatment: 1 Vital Signs Height(in):  58 Pulse(bpm): 69 Weight(lbs): 160 Blood Pressure(mmHg): 141/82 Body Mass Index(BMI): 33.4 Temperature(F): 97.8 Respiratory Rate(breaths/min): 18 [1:Photos:] [N/A:N/A] Left, Lateral Lower Leg Left, Lateral Ankle N/A Wound Location: Gradually Appeared Pressure Injury N/A Wounding Event: Karen Jackson, Karen Jackson ([id number]) [id number] Page 3 of 8 Venous Leg Ulcer Pressure Ulcer N/A Primary Etiology: Abscess N/A N/A Secondary Etiology: Arrhythmia, Congestive Heart Failure, Arrhythmia, Congestive Heart Failure, N/A Comorbid History: Osteoarthritis Osteoarthritis 12/27/2022 02/22/2023 N/A Date Acquired: 1 1 N/A Weeks of Treatment: Open Open N/A Wound Status: No No N/A Wound Recurrence: 1x0.4x0.7 0.1x0.1x1 N/A  Measurements L x W x D (cm) 0.314 0.008 N/A A (cm) : rea 0.22 0.008 N/A Volume (cm) : 36.60% 96.80% N/A % Reduction in A rea: 50.60% 96.80% N/A % Reduction in Volume: 6 Starting Position 1 (o'clock): 9 Ending Position 1 (o'clock): 0.5 Maximum Distance 1 (cm): Yes No N/A Undermining: Full Thickness Without Exposed Category/Stage IV N/A Classification: Support Structures Medium Medium N/A Exudate A mount: Serosanguineous Serosanguineous N/A Exudate Type: red, brown red, brown N/A Exudate Color: Distinct, outline attached N/A N/A Wound Margin: Medium (34-66%) Small (1-33%) N/A Granulation Amount: Red, Pink, Hyper-granulation Pink N/A Granulation Quality: Medium (34-66%) Large (67-100%) N/A Necrotic Amount: Adherent Slough Eschar N/A Necrotic Tissue: Fat Layer (Subcutaneous Tissue): Yes Fat Layer (Subcutaneous Tissue): Yes N/A Exposed Structures: Fascia: No Bone: Yes Tendon: No Muscle: No Joint: No Bone: No Small (1-33%) Small (1-33%) N/A Epithelialization: Scarring: Yes No Abnormalities Noted N/A Periwound Skin Texture: Excoriation: No Induration: No Callus: No Crepitus: No Rash: No Maceration: No No Abnormalities Noted  N/A Periwound Skin Moisture: Dry/Scaly: No Atrophie Blanche: No No Abnormalities Noted N/A Periwound Skin Color: Cyanosis: No Ecchymosis: No Erythema: No Hemosiderin Staining: No Mottled: No Pallor: No Rubor: No N/A Yes N/A Tenderness on Palpation: Chemical Cauterization N/A N/A Procedures Performed: Treatment Notes Electronic Signature(s) Signed: 03/02/2023 12:48:40 PM By: Geralyn Corwin DO Entered By: Geralyn Corwin on 03/02/2023 12:26:34 -------------------------------------------------------------------------------- Multi-Disciplinary Care Plan Details Patient Name: Date of Service: Karen Jackson [place] F. 03/02/2023 11:45 A M Medical Record Number: [id number] Patient Account Number: [id number] Date of Birth/Sex: Treating RN: [birth year] (82 y.o. [name] Primary Care : Ezequiel Kayser Other Clinician: Referring : Treating /Extender: Ermalinda Barrios in Treatment: 1 Active Inactive Wound/Skin Impairment Karen Jackson, Karen Jackson ([id number]) [id number] Page 4 of 8 Nursing Diagnoses: Impaired tissue integrity Goals: Patient/caregiver will verbalize understanding of skin care regimen Date Initiated: 02/23/2023 Target Resolution Date: 05/28/2023 Goal Status: Active Interventions: Assess ulceration(s) every visit Treatment Activities: Skin care regimen initiated : 02/23/2023 Notes: Electronic Signature(s) Signed: 03/02/2023 1:37:42 PM By: Karie Schwalbe RN Entered By: Karie Schwalbe on 03/02/2023 12:45:35 -------------------------------------------------------------------------------- Pain Assessment Details Patient Name: Date of Service: Karen Jackson [place] F. 03/02/2023 11:45 A M Medical Record Number: [id number] Patient Account Number: [id number] Date of Birth/Sex: Treating RN: 11/11/1940 (82 y.o. F) Primary Care : Ezequiel Kayser Other Clinician: Referring : Treating /Extender: Ermalinda Barrios in Treatment: 1 Active Problems Location of Pain Severity and Description of Pain Patient Has Paino Yes Site Locations Pain Location: Pain in Ulcers Rate the pain. Current Pain Level: 6 Pain Management and Medication Current Pain Management: Electronic Signature(s) Signed: 03/02/2023 12:18:22 PM By: Thayer Dallas Entered By: Thayer Dallas on 03/02/2023 11:44:47 Andrey Campanile ([id number]) [id number] Page 5 of 8 -------------------------------------------------------------------------------- Patient/Caregiver Education Details Patient Name: Date of Service: Karen Jackson [place]. 7/5/2024andnbsp11:45 A M Medical Record Number: [id number] Patient Account Number: [id number] Date of Birth/Gender: Treating RN: 1941-03-21 (82 y.o. [name] Primary Care Physician: Ezequiel Kayser Other Clinician: Referring Physician: Treating Physician/Extender: Ermalinda Barrios in Treatment: 1 Education Assessment Education Provided To: Patient Education Topics Provided Wound/Skin Impairment: Methods: Explain/Verbal Responses: State content correctly Electronic Signature(s) Signed: 03/02/2023 1:37:42 PM By: Karie Schwalbe RN Entered By: Karie Schwalbe on 03/02/2023 12:45:52 -------------------------------------------------------------------------------- Wound Assessment Details Patient Name: Date of Service: Karen Jackson [place] F. 03/02/2023 11:45 A M Medical Record Number: [id number] Patient Account Number: [id number] Date of Birth/Sex: Treating RN:  [birth year] (82 y.o. F) Primary Care : Ezequiel Kayser Other Clinician: Referring : Treating /Extender: Levell July Weeks in Treatment: 1 Wound Status Wound Number: 1 Primary Etiology: Venous Leg Ulcer Wound Location: Left, Lateral Lower Leg Secondary Etiology: Abscess Wounding Event: Gradually Appeared Wound Status: Open Date  Acquired: 12/27/2022 Comorbid History: Arrhythmia, Congestive Heart Failure, Osteoarthritis Weeks Of Treatment: 1 Clustered Wound: No Photos Wound Measurements Length: (cm) 1 Width: (cm) 0. Karen Jackson, Karen Jackson ([id number]) Depth: (cm) 0 Area: (cm) Volume: (cm) % Reduction in Area: 36.6% 4 % Reduction in Volume: 50.6% [id number] Page 6 of 8 .7 Epithelialization: Small (1-33%) 0.314 Tunneling: No 0.22 Undermining: Yes Starting Position (o'clock): 6 Ending Position (o'clock): 9 Maximum Distance: (cm) 0.5 Wound Description Classification: Full Thickness Without Exposed Support Structures Wound Margin: Distinct, outline attached Exudate Amount: Medium Exudate Type: Serosanguineous Exudate Color: red, brown Foul Odor After Cleansing: No Slough/Fibrino Yes Wound Bed Granulation Amount: Medium (34-66%) Exposed Structure Granulation Quality: Red, Pink, Hyper-granulation Fascia Exposed: No Necrotic Amount: Medium (34-66%) Fat Layer (Subcutaneous Tissue) Exposed: Yes Necrotic Quality: Adherent Slough Tendon Exposed: No Muscle Exposed: No Joint Exposed: No Bone Exposed: No Periwound Skin Texture Texture Color No Abnormalities Noted: No No Abnormalities Noted: No Callus: No Atrophie Blanche: No Crepitus: No Cyanosis: No Excoriation: No Ecchymosis: No Induration: No Erythema: No Rash: No Hemosiderin Staining: No Scarring: Yes Mottled: No Pallor: No Moisture Rubor: No No Abnormalities Noted: No Dry / Scaly: No Maceration: No Treatment Notes Wound #1 (Lower Leg) Wound Laterality: Left, Lateral Cleanser Vashe 5.8 (oz) Discharge Instruction: Cleanse the wound with Vashe prior to applying a clean dressing using gauze sponges, not tissue or cotton balls. Peri-Wound Care Topical Mupirocin Ointment Discharge Instruction: Apply Mupirocin (Bactroban) first then place Hydrafera blue Ready Primary Dressing Hydrofera Blue Ready Transfer Foam, 2.5x2.5  (in/in) Discharge Instruction: Apply over Mupirocin then apply Hydrafera Blue Secondary Dressing Bordered Gauze, 2x2 in Discharge Instruction: Apply over primary dressing Hydrafera Blue Secured With Tubigrip E Compression Wrap Compression Stockings Add-Ons Electronic Signature(s) Signed: 03/02/2023 1:37:42 PM By: Karie Schwalbe RN Entered By: Karie Schwalbe on 03/02/2023 11:57:10 Andrey Campanile ([id number]) [id number] Page 7 of 8 -------------------------------------------------------------------------------- Wound Assessment Details Patient Name: Date of Service: Karen Jackson [place] F. 03/02/2023 11:45 A M Medical Record Number: [id number] Patient Account Number: [id number] Date of Birth/Sex: Treating RN: [birth year] (82 y.o. F) Primary Care : Ezequiel Kayser Other Clinician: Referring : Treating /Extender: Levell July Weeks in Treatment: 1 Wound Status Wound Number: 2 Primary Etiology: Pressure Ulcer Wound Location: Left, Lateral Ankle Wound Status: Open Wounding Event: Pressure Injury Comorbid History: Arrhythmia, Congestive Heart Failure, Osteoarthritis Date Acquired: 02/22/2023 Weeks Of Treatment: 1 Clustered Wound: No Wound under treatment by  outside of Wound Center Photos Wound Measurements Length: (cm) 0.1 Width: (cm) 0.1 Depth: (cm) 1 Area: (cm) 0.008 Volume: (cm) 0.008 % Reduction in Area: 96.8% % Reduction in Volume: 96.8% Epithelialization: Small (1-33%) Tunneling: No Undermining: No Wound Description Classification: Category/Stage IV Exudate Amount: Medium Exudate Type: Serosanguineous Exudate Color: red, brown Foul Odor After Cleansing: No Slough/Fibrino Yes Wound Bed Granulation Amount: Small (1-33%) Exposed Structure Granulation Quality: Pink Fat Layer (Subcutaneous Tissue) Exposed: Yes Necrotic Amount: Large (67-100%) Bone Exposed: Yes Necrotic Quality: Eschar Periwound Skin  Texture Texture Color No Abnormalities Noted: Yes No Abnormalities Noted: Yes Moisture Temperature / Pain No Abnormalities Noted: Yes Tenderness on Palpation: Yes Treatment Notes Wound #2 (Ankle) Wound Laterality: Left, Lateral Cleanser Vashe 5.8 (oz) Discharge  Instruction: Cleanse the wound with Vashe prior to applying a clean dressing using gauze sponges, not tissue or cotton balls. Peri-Wound Care Karen Jackson, Karen Jackson ([id number]) [id number] Page 8 of 8 Topical Mupirocin Ointment Discharge Instruction: Apply Mupirocin (Bactroban) first then place Hydrafera blue Ready Primary Dressing Hydrofera Blue Ready Transfer Foam, 2.5x2.5 (in/in) Discharge Instruction: Apply over Mupirocin then apply Hydrafera Blue Secondary Dressing Bordered Gauze, 2x2 in Discharge Instruction: Apply over primary dressing Hydrafera Blue Secured With Tubigrip E Compression Wrap Compression Stockings Add-Ons Electronic Signature(s) Signed: 03/02/2023 12:48:40 PM By: Geralyn Corwin DO Entered By: Geralyn Corwin on 03/02/2023 12:26:18 -------------------------------------------------------------------------------- Vitals Details Patient Name: Date of Service: Karen Jackson [place] F. 03/02/2023 11:45 A M Medical Record Number: [id number] Patient Account Number: [id number] Date of Birth/Sex: Treating RN: [birth year] (82 y.o. F) Primary Care : Ezequiel Kayser Other Clinician: Referring : Treating /Extender: Ermalinda Barrios in Treatment: 1 Vital Signs Time Taken: 11:46 Temperature (F): 97.8 Height (in): 58 Pulse (bpm): 69 Weight (lbs): 160 Respiratory Rate (breaths/min): 18 Body Mass Index (BMI): 33.4 Blood Pressure (mmHg): 141/82 Reference Range: 80 - 120 mg / dl Electronic Signature(s) Signed: 03/02/2023 12:18:22 PM By: Thayer Dallas Entered By: Thayer Dallas on 03/02/2023 11:46:38

## 2023-03-05 NOTE — Progress Notes (Signed)
NEKEYA, DAME ([id number]) [id number] Page 1 of 9 Visit Report for 03/02/2023 Chief Complaint Document Details Patient Name: Date of Service: Karen Jackson [place] F. 03/02/2023 11:45 A M Medical Record Number: [id number] Patient Account Number: [id number] Date of Birth/Sex: Treating RN: [birth year] (82 y.o. F) Primary Care Provider: Ezequiel Jackson Other Clinician: Referring Provider: Treating Provider/Extender: Ermalinda Barrios in Treatment: 1 Information Obtained from: Patient Chief Complaint 02/23/2023; left lateral leg wound, left lateral ankle wound Electronic Signature(s) Signed: 03/02/2023 12:48:40 PM By: Geralyn Corwin DO Entered By: Geralyn Corwin on 03/02/2023 12:26:51 -------------------------------------------------------------------------------- HPI Details Patient Name: Date of Service: Karen Jackson [place] F. 03/02/2023 11:45 A M Medical Record Number: [id number] Patient Account Number: [id number] Date of Birth/Sex: Treating RN: August 21, 1941 (82 y.o. F) Primary Care Provider: Ezequiel Jackson Other Clinician: Referring Provider: Treating Provider/Extender: Ermalinda Barrios in Treatment: 1 History of Present Illness HPI Description: 02/23/2023 Ms. 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 T the left leg she has been following her primary care physician office for this issue. She is not quite sure how it started. o She has had 2 rounds of doxycycline. Currently she is not doing any wound care dressing but keeping the area covered. The left lateral ankle wound has been an ongoing issue for the patient over the past  year. She follows with Dr. Victorino Dike who did a left ankle ORIF on 07/20/2018. She is had waxing and waning in wound healing to this area. She reports chronic pain to this site however denies any increased warmth, erythema or purulent drainage. 7/5; patient presents for follow-up. A wound culture done at last clinic visit grew Proteus mirabilis and Staphylococcus warneri. This is sensitive to ciprofloxacin. She has been using antibiotic ointment with Hydrofera Blue to the wound beds. She has not been scheduled for her CT scan of the left ankle. She denies systemic signs of infection. Electronic Signature(s) Signed: 03/02/2023 12:48:40 PM By: Geralyn Corwin DO Entered By: Geralyn Corwin on 03/02/2023 12:45:29 Chemical Cauterization Details -------------------------------------------------------------------------------- Karen Jackson ([id number]) [id number] Page 2 of 9 Patient Name: Date of Service: Karen Jackson [place] F. 03/02/2023 11:45 A M Medical Record Number: [id number] Patient Account Number: [id number] Date of Birth/Sex: Treating RN: 05-28-1941 (82 y.o. Karen Jackson Other Clinician: Referring Provider: Treating Provider/Extender: Ermalinda Barrios in Treatment: 1 Procedure Performed for: Wound #1 Left,Lateral Lower Leg Performed By: Physician Geralyn Corwin, DO Post Procedure Diagnosis Same as Pre-procedure Electronic Signature(s) Signed: 03/02/2023 12:48:40 PM By: Geralyn Corwin DO Signed: 03/02/2023 1:37:42 PM By: Karie Schwalbe RN Entered By: Karie Schwalbe on 03/02/2023 12:21:38 -------------------------------------------------------------------------------- Physical Exam Details Patient Name: Date of Service: Karen Jackson [place] F. 03/02/2023 11:45 A M Medical Record Number: [id number] Patient Account Number: [id number] Date of Birth/Sex: Treating RN: Sep 25, 1940 (82 y.o. F) Primary Care Provider:  Ezequiel Jackson Other Clinician: Referring Provider: Treating Provider/Extender: Ermalinda Barrios in Treatment: 1 Constitutional respirations regular, non-labored and within target range for patient.. Cardiovascular 2+ dorsalis pedis/posterior tibialis pulses. Psychiatric pleasant and cooperative. Notes  Left lower extremity: T the lateral aspect of the leg there is an open wound with hyperregulated tissue. Evidence of venous stasis dermatitis. T the left lateral o o ankle there is an open pinpoint area that probes straight to bone/possible metal plate. No signs of active soft tissue infection including increased warmth, erythema purulent drainage. Electronic Signature(s) Signed: 03/02/2023 12:48:40 PM By: Geralyn Corwin DO Entered By: Geralyn Corwin on 03/02/2023 12:46:07 -------------------------------------------------------------------------------- Physician Orders Details Patient Name: Date of Service: Karen Jackson [place] F. 03/02/2023 11:45 A M Medical Record Number: [id number] Patient Account Number: [id number] Date of Birth/Sex: Treating RN: 1941-08-12 (82 y.o. Karen Jackson Other Clinician: Referring Provider: Treating Provider/Extender: Ermalinda Barrios in Treatment: 1 Verbal / Phone Orders: No Diagnosis [address] NIMRAH, BALAY ([id number]) [id number] Page 3 of 9 Follow-up Appointments ppointment in 1 week. - Dr. Mikey Bussing Room 9 July 19th at 3pm Return A Other: - Please pick up Bactrim at Pharmacy Anesthetic (In clinic) Topical Lidocaine 4% applied to wound bed Bathing/ Shower/ Hygiene May shower and wash wound with soap and water. Edema Control - Lymphedema / SCD / Other Bilateral Lower Extremities Elevate legs to the level of the heart or above for 30 minutes daily and/or when sitting for 3-4 times a day throughout the day. Avoid standing for long periods of  time. Off-Loading Other: - Wear Bunny Boot/Prevalon Boot when in bed/sitting for long periods of time. Try and limit wearing left leg brace. Use the Tubigrip for now on the left leg and do not wear compression hose for now (02/23/23) Wound Treatment Wound #1 - Lower Leg Wound Laterality: Left, Lateral Cleanser: Vashe 5.8 (oz) (Generic) 1 x Per Day/30 Days Discharge Instructions: Cleanse the wound with Vashe Karen Jackson to applying a clean dressing using gauze sponges, not tissue or cotton balls. Topical: Mupirocin Ointment 1 x Per Day/30 Days Discharge Instructions: Apply Mupirocin (Bactroban) first then place Hydrafera blue Ready Prim Dressing: Hydrofera Blue Ready Transfer Foam, 2.5x2.5 (in/in) (Generic) 1 x Per Day/30 Days ary Discharge Instructions: Apply over Mupirocin then apply Hydrafera Blue Secondary Dressing: Bordered Gauze, 2x2 in (Generic) 1 x Per Day/30 Days Discharge Instructions: Apply over primary dressing Hydrafera Blue Secured With: Tubigrip E 1 x Per Day/30 Days Wound #2 - Ankle Wound Laterality: Left, Lateral Cleanser: Vashe 5.8 (oz) (Generic) 1 x Per Day/30 Days Discharge Instructions: Cleanse the wound with Vashe Karen Jackson to applying a clean dressing using gauze sponges, not tissue or cotton balls. Topical: Mupirocin Ointment 1 x Per Day/30 Days Discharge Instructions: Apply Mupirocin (Bactroban) first then place Hydrafera blue Ready Prim Dressing: Hydrofera Blue Ready Transfer Foam, 2.5x2.5 (in/in) (Generic) 1 x Per Day/30 Days ary Discharge Instructions: Apply over Mupirocin then apply Hydrafera Blue Secondary Dressing: Bordered Gauze, 2x2 in (Generic) 1 x Per Day/30 Days Discharge Instructions: Apply over primary dressing Hydrafera Blue Secured With: Tubigrip E 1 x Per Day/30 Days Patient Medications llergies: No Known Drug Allergies A Notifications Medication Indication Start End 03/02/2023 ciprofloxacin HCl DOSE 1 - oral 500 mg tablet - 1 tablet oral twice a day x 7  days Electronic Signature(s) Signed: 03/02/2023 12:48:40 PM By: Geralyn Corwin DO Signed: 03/02/2023 1:37:42 PM By: Karie Schwalbe RN Previous Signature: 03/02/2023 12:34:47 PM Version By: Geralyn Corwin DO Entered By: Karie Schwalbe on 03/02/2023 12:46:49 Karen Jackson ([id number]) [id number] Page 4 of 9 -------------------------------------------------------------------------------- Problem List Details Patient Name: Date of Service: Karen Jackson [place] F. 03/02/2023 11:45 A  M Medical Record Number: [id number] Patient Account Number: [id number] Date of Birth/Sex: Treating RN: May 15, 1941 (82 y.o. F) Primary Care Provider: Ezequiel Jackson Other Clinician: Referring Provider: Treating Provider/Extender: Ermalinda Barrios in Treatment: 1 Active Problems ICD-10 Encounter Code Description Active Date MDM Diagnosis [phone] Non-pressure chronic ulcer of other part of left lower leg with fat layer exposed 02/23/2023 No Yes I87.312 Chronic venous hypertension (idiopathic) with ulcer of left lower extremity 02/23/2023 No Yes L89.524 Pressure ulcer of left ankle, stage 4 02/23/2023 No Yes E11.621 Type 2 diabetes mellitus with foot ulcer 02/23/2023 No Yes I67.9 Cerebrovascular disease, unspecified 02/23/2023 No Yes I69.952 Hemiplegia and hemiparesis following unspecified cerebrovascular disease 02/23/2023 No Yes affecting left dominant side I48.19 Other persistent atrial fibrillation 02/23/2023 No Yes Z79.01 Long term (current) use of anticoagulants 02/23/2023 No Yes Inactive Problems Resolved Problems Electronic Signature(s) Signed: 03/02/2023 12:48:40 PM By: Geralyn Corwin DO Entered By: Geralyn Corwin on 03/02/2023 12:26:24 -------------------------------------------------------------------------------- Progress Note Details Patient Name: Date of Service: Karen [place] F. 03/02/2023 11:45 A M Medical Record Number: [id number] Patient Account Number:  [id number] Date of Birth/Sex: Treating RN: 06/12/1941 (82 y.o. F) Primary Care Provider: Ezequiel Jackson Other Clinician: Referring Provider: Treating Provider/Extender: Ermalinda Barrios in Treatment: [address] Karen Jackson, Karen Jackson ([id number]) [id number] Page 5 of 9 Chief Complaint Information obtained from Patient 02/23/2023; left lateral leg wound, left lateral ankle wound History of Present Illness (HPI) 02/23/2023 Ms. 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 T the left leg she has been following her primary care physician office for this issue. She is not quite sure how it started. o She has had 2 rounds of doxycycline. Currently she is not doing any wound care dressing but keeping the area covered. The left lateral ankle wound has been an ongoing issue for the patient over the past year. She follows with Dr. Victorino Dike who did a left ankle ORIF on 07/20/2018. She is had waxing and waning in wound healing to this area. She reports chronic pain to this site however denies any increased warmth, erythema or purulent drainage. 7/5; patient presents for follow-up. A wound culture done at last clinic visit grew Proteus mirabilis and Staphylococcus warneri. This is sensitive to ciprofloxacin. She has been using antibiotic ointment with Hydrofera Blue to the wound beds. She has not been scheduled for her CT scan of the left ankle. She denies systemic signs of infection. Patient History Information obtained from Patient, Chart. Family History Cancer - Mother,Siblings. Social History Never smoker, Marital Status - Widowed, Alcohol Use - Never,  Drug Use - No History, Caffeine Use - Never. Medical History Cardiovascular Patient has history of Arrhythmia - Hx A. fib, Congestive Heart Failure Endocrine Denies history of Type II Diabetes Musculoskeletal Patient has history of Osteoarthritis Hospitalization/Surgery History - bilat knee replacement. - right ankle replacement. Medical A Surgical History Notes nd Constitutional Symptoms (General Health) Stroke Respiratory pulmonary HTN Gastrointestinal GERD DIVERTICULITIS Neurologic hemiparesis left side late affect of CVA Objective Constitutional respirations regular, non-labored and within target range for patient.. Vitals Time Taken: 11:46 AM, Height: 58 in, Weight: 160 lbs,  BMI: 33.4, Temperature: 97.8 F, Pulse: 69 bpm, Respiratory Rate: 18 breaths/min, Blood Pressure: 141/82 mmHg. Cardiovascular 2+ dorsalis pedis/posterior tibialis pulses. Psychiatric pleasant and cooperative. General Notes: Left lower extremity: T the lateral aspect of the leg there is an open wound with hyperregulated tissue. Evidence of venous stasis dermatitis. o T the left lateral ankle there is an open pinpoint area that probes straight to bone/possible metal plate. No signs of active soft tissue infection including o increased warmth, erythema purulent drainage. Integumentary (Hair, Skin) Wound #1 status is Open. Original cause of wound was Gradually Appeared. The date acquired was: 12/27/2022. The wound has been in treatment 1 weeks. The wound is located on the Left,Lateral Lower Leg. The wound measures 1cm length x 0.4cm width x 0.7cm depth; 0.314cm^2 area and 0.22cm^3 volume. There is Fat Layer (Subcutaneous Tissue) exposed. There is no tunneling noted, however, there is undermining starting at 6:00 and ending at 9:00 with a maximum distance of 0.5cm. There is a medium amount of serosanguineous drainage noted. The wound margin is distinct with the outline attached to the wound base. There is  medium (34-66%) red, pink, hyper - granulation within the wound bed. There is a medium (34-66%) amount of necrotic tissue within the wound bed including Adherent Slough. The periwound skin appearance exhibited: Scarring. The periwound skin appearance did not exhibit: Callus, Crepitus, Excoriation, Induration, Rash, Dry/Scaly, Maceration, Atrophie Blanche, Cyanosis, Ecchymosis, Hemosiderin Staining, Mottled, Pallor, Rubor, Erythema. Wound #2 status is Open. Original cause of wound was Pressure Injury. The date acquired was: 02/22/2023. The wound has been in treatment 1 weeks. The wound is located on the Left,Lateral Ankle. The wound measures 0.1cm length x 0.1cm width x 1cm depth; 0.008cm^2 area and 0.008cm^3 volume. There is Karen Jackson, Karen Jackson ([id number]) [id number] Page 6 of 9 bone and Fat Layer (Subcutaneous Tissue) exposed. There is no tunneling or undermining noted. There is a medium amount of serosanguineous drainage noted. There is small (1-33%) pink granulation within the wound bed. There is a large (67-100%) amount of necrotic tissue within the wound bed including Eschar. The periwound skin appearance had no abnormalities noted for texture. The periwound skin appearance had no abnormalities noted for moisture. The periwound skin appearance had no abnormalities noted for color. The periwound has tenderness on palpation. Assessment Active Problems ICD-10 Non-pressure chronic ulcer of other part of left lower leg with fat layer exposed Chronic venous hypertension (idiopathic) with ulcer of left lower extremity Pressure ulcer of left ankle, stage 4 Type 2 diabetes mellitus with foot ulcer Cerebrovascular disease, unspecified Hemiplegia and hemiparesis following unspecified cerebrovascular disease affecting left dominant side Other persistent atrial fibrillation Long term (current) use of anticoagulants Patient's left lower extremity wound is stable. I used silver  nitrate to the hyper granulated area. Based on culture result I will send in Ciprofloxacin. Continue Tubigrip to help with compression. The left lateral ankle wound still probes to bone/possible metal plate. I recommended she follow-up with Dr. Victorino Dike for this issue. We will follow-up the CT scan to make sure this is scheduled. Follow-up in 1-2 weeks. Continue aggressive offloading with Prevalon boot. She knows to not ambulate with this. Procedures Wound #1 Pre-procedure diagnosis of Wound #1 is a Venous Leg Ulcer located on the Left,Lateral Lower Leg . An Chemical Cauterization procedure was performed by Geralyn Corwin, DO. Post procedure Diagnosis Wound #1: Same as Pre-Procedure Plan Follow-up Appointments: Return Appointment in 1 week. - Dr. Mikey Bussing, or next available appointment Mon,Tues,Thurs or Friday Other: - Please pick  up Bactrim at Pharmacy Anesthetic: (In clinic) Topical Lidocaine 4% applied to wound bed Bathing/ Shower/ Hygiene: May shower and wash wound with soap and water. Edema Control - Lymphedema / SCD / Other: Elevate legs to the level of the heart or above for 30 minutes daily and/or when sitting for 3-4 times a day throughout the day. Avoid standing for long periods of time. Off-Loading: Other: - Wear Bunny Boot/Prevalon Boot when in bed/sitting for long periods of time. Try and limit wearing left leg brace. Use the Tubigrip for now on the left leg and do not wear compression hose for now (02/23/23) The following medication(s) was prescribed: ciprofloxacin HCl oral 500 mg tablet 1 1 tablet oral twice a day x 7 days starting 03/02/2023 WOUND #1: - Lower Leg Wound Laterality: Left, Lateral Cleanser: Vashe 5.8 (oz) (Generic) 1 x Per Day/30 Days Discharge Instructions: Cleanse the wound with Vashe Karen Jackson to applying a clean dressing using gauze sponges, not tissue or cotton balls. Topical: Mupirocin Ointment 1 x Per Day/30 Days Discharge Instructions: Apply Mupirocin  (Bactroban) first then place Hydrafera blue Ready Prim Dressing: Hydrofera Blue Ready Transfer Foam, 2.5x2.5 (in/in) (Generic) 1 x Per Day/30 Days ary Discharge Instructions: Apply over Mupirocin then apply Hydrafera Blue Secondary Dressing: Bordered Gauze, 2x2 in (Generic) 1 x Per Day/30 Days Discharge Instructions: Apply over primary dressing Hydrafera Blue Secured With: Tubigrip E 1 x Per Day/30 Days WOUND #2: - Ankle Wound Laterality: Left, Lateral Cleanser: Vashe 5.8 (oz) (Generic) 1 x Per Day/30 Days Discharge Instructions: Cleanse the wound with Vashe Karen Jackson to applying a clean dressing using gauze sponges, not tissue or cotton balls. Topical: Mupirocin Ointment 1 x Per Day/30 Days Discharge Instructions: Apply Mupirocin (Bactroban) first then place Hydrafera blue Ready Prim Dressing: Hydrofera Blue Ready Transfer Foam, 2.5x2.5 (in/in) (Generic) 1 x Per Day/30 Days ary Discharge Instructions: Apply over Mupirocin then apply Hydrafera Blue Secondary Dressing: Bordered Gauze, 2x2 in (Generic) 1 x Per Day/30 Days Discharge Instructions: Apply over primary dressing Hydrafera Blue Secured With: Tubigrip E 1 x Per Day/30 Days 1. Ciprofloxacin 2. Silver nitrate 3. [address] and mupirocin ointment Karen Jackson, Karen Jackson ([id number]) [id number] Page 7 of 9 4. Prevalon boot 5. Follow-up in 1-2 weeks Electronic Signature(s) Signed: 03/02/2023 12:48:40 PM By: Geralyn Corwin DO Entered By: Geralyn Corwin on 03/02/2023 12:47:58 -------------------------------------------------------------------------------- HxROS Details Patient Name: Date of Service: Karen Jackson [place] F. 03/02/2023 11:45 A M Medical Record Number: [id number] Patient Account Number: [id number] Date of Birth/Sex: Treating RN: 09-Aug-1941 (82 y.o. F) Primary Care Provider: Ezequiel Jackson Other Clinician: Referring Provider: Treating Provider/Extender: Ermalinda Barrios in Treatment:  1 Information Obtained From Patient Chart Constitutional Symptoms (General Health) Medical History: Past Medical History Notes: Stroke Respiratory Medical History: Past Medical History Notes: pulmonary HTN Cardiovascular Medical History: Positive for: Arrhythmia - Hx A. fib; Congestive Heart Failure Gastrointestinal Medical History: Past Medical History Notes: GERD DIVERTICULITIS Endocrine Medical History: Negative for: Type II Diabetes Musculoskeletal Medical History: Positive for: Osteoarthritis Neurologic Medical History: Past Medical History Notes: hemiparesis left side late affect of CVA Immunizations Pneumococcal Vaccine: Received Pneumococcal Vaccination: Yes Received Pneumococcal Vaccination On or After 60th Birthday: Yes Implantable Devices No devices added Karen Jackson, Karen Jackson ([id number]) [id number] Page 8 of 9 Hospitalization / Surgery History Type of Hospitalization/Surgery bilat knee replacement right ankle replacement Family and Social History Cancer: Yes - Mother,Siblings; Never smoker; Marital Status - Widowed; Alcohol Use: Never; Drug Use: No History; Caffeine Use: Never; Financial Concerns:  No; Food, Clothing or Shelter Needs: No; Support System Lacking: No; Transportation Concerns: No Electronic Signature(s) Signed: 03/02/2023 12:48:40 PM By: Geralyn Corwin DO Entered By: Geralyn Corwin on 03/02/2023 12:45:34 -------------------------------------------------------------------------------- SuperBill Details Patient Name: Date of Service: Karen Jackson [place] F. 03/02/2023 Medical Record Number: [id number] Patient Account Number: [id number] Date of Birth/Sex: Treating RN: Apr 01, 1941 (82 y.o. F) Primary Care Provider: Ezequiel Jackson Other Clinician: Referring Provider: Treating Provider/Extender: Ermalinda Barrios in Treatment: 1 Diagnosis Coding ICD-10 Codes Code Description [phone] Non-pressure chronic  ulcer of other part of left lower leg with fat layer exposed I87.312 Chronic venous hypertension (idiopathic) with ulcer of left lower extremity L89.524 Pressure ulcer of left ankle, stage 4 E11.621 Type 2 diabetes mellitus with foot ulcer I67.9 Cerebrovascular disease, unspecified I69.952 Hemiplegia and hemiparesis following unspecified cerebrovascular disease affecting left dominant side I48.19 Other persistent atrial fibrillation Z79.01 Long term (current) use of anticoagulants Facility Procedures : CPT4 Code: [id number] Description: 17250 - CHEM CAUT GRANULATION TISS ICD-10 Diagnosis Description L97.822 Non-pressure chronic ulcer of other part of left lower leg with fat layer expos Modifier: ed Quantity: 1 Physician Procedures : CPT4 Code Description Modifier [id number] - WC PHYS LEVEL 4 - EST PT ICD-10 Diagnosis Description L97.822 Non-pressure chronic ulcer of other part of left lower leg with fat layer exposed I87.312 Chronic venous hypertension (idiopathic) with ulcer  of left lower extremity E11.621 Type 2 diabetes mellitus with foot ulcer L89.524 Pressure ulcer of left ankle, stage 4 Quantity: 1 : [id number] 17250 - WC PHYS CHEM CAUT GRAN TISSUE ICD-10 Diagnosis Description L97.822 Non-pressure chronic ulcer of other part of left lower leg with fat layer exposed Quantity: 1 Electronic Signature(s) Signed: 03/02/2023 1:37:42 PM By: Karie Schwalbe RN Signed: 03/05/2023 12:25:26 PM By: Geralyn Corwin DO Previous Signature: 03/02/2023 12:48:40 PM Version By: Geralyn Corwin DO Entered By: Karie Schwalbe on 03/02/2023 12:50:53 Karen Jackson ([id number]) [id number] Page 9 of 9

## 2023-03-06 ENCOUNTER — Ambulatory Visit: Payer: Medicare Other | Admitting: Physical Therapy

## 2023-03-07 ENCOUNTER — Ambulatory Visit (HOSPITAL_COMMUNITY)
Admission: RE | Admit: 2023-03-07 | Discharge: 2023-03-07 | Disposition: A | Discharge status: Home / Self Care | Payer: Medicare Other | Source: Ambulatory Visit | Attending: Internal Medicine | Admitting: Internal Medicine

## 2023-03-07 DIAGNOSIS — I69952 Hemiplegia and hemiparesis following unspecified cerebrovascular disease affecting left dominant side: Secondary | ICD-10-CM | POA: Insufficient documentation

## 2023-03-07 DIAGNOSIS — S91002D Unspecified open wound, left ankle, subsequent encounter: Secondary | ICD-10-CM | POA: Insufficient documentation

## 2023-03-07 DIAGNOSIS — E118 Type 2 diabetes mellitus with unspecified complications: Secondary | ICD-10-CM | POA: Diagnosis not present

## 2023-03-07 DIAGNOSIS — R600 Localized edema: Secondary | ICD-10-CM | POA: Diagnosis not present

## 2023-03-07 DIAGNOSIS — S91002A Unspecified open wound, left ankle, initial encounter: Secondary | ICD-10-CM | POA: Diagnosis not present

## 2023-03-07 DIAGNOSIS — M8580 Other specified disorders of bone density and structure, unspecified site: Secondary | ICD-10-CM | POA: Diagnosis not present

## 2023-03-07 LAB — POCT I-STAT CREATININE: Creatinine, Ser: 0.7 mg/dL (ref 0.44–1.00)

## 2023-03-07 MED ORDER — IOHEXOL 300 MG/ML  SOLN
100.0000 mL | Freq: Once | INTRAMUSCULAR | Status: AC | PRN
  Administered 2023-03-07: 100 mL via INTRAVENOUS

## 2023-03-08 ENCOUNTER — Ambulatory Visit: Payer: Medicare Other | Admitting: Physical Therapy

## 2023-03-13 ENCOUNTER — Encounter (HOSPITAL_BASED_OUTPATIENT_CLINIC_OR_DEPARTMENT_OTHER): Payer: Medicare Other | Admitting: Internal Medicine

## 2023-03-13 ENCOUNTER — Ambulatory Visit: Payer: Medicare Other | Admitting: Physical Therapy

## 2023-03-13 DIAGNOSIS — L97822 Non-pressure chronic ulcer of other part of left lower leg with fat layer exposed: Secondary | ICD-10-CM | POA: Diagnosis not present

## 2023-03-13 DIAGNOSIS — L89524 Pressure ulcer of left ankle, stage 4: Secondary | ICD-10-CM

## 2023-03-13 DIAGNOSIS — E11621 Type 2 diabetes mellitus with foot ulcer: Secondary | ICD-10-CM

## 2023-03-13 DIAGNOSIS — I87312 Chronic venous hypertension (idiopathic) with ulcer of left lower extremity: Secondary | ICD-10-CM | POA: Diagnosis not present

## 2023-03-13 DIAGNOSIS — I69354 Hemiplegia and hemiparesis following cerebral infarction affecting left non-dominant side: Secondary | ICD-10-CM | POA: Diagnosis not present

## 2023-03-13 DIAGNOSIS — I4819 Other persistent atrial fibrillation: Secondary | ICD-10-CM | POA: Diagnosis not present

## 2023-03-13 DIAGNOSIS — E11622 Type 2 diabetes mellitus with other skin ulcer: Secondary | ICD-10-CM | POA: Diagnosis not present

## 2023-03-13 NOTE — Progress Notes (Addendum)
Karen, Jackson ([id number]) [id number] Page 1 of 8 Visit Report for 03/13/2023 Chief Complaint Document Details Patient Name: Date of Service: Karen Jackson [place] F. 03/13/2023 3:00 PM Medical Record Number: [id number] Patient Account Number: [id number] Date of Birth/Sex: Treating RN: [birth year] (82 y.o. F) Primary Care Provider: Ezequiel Kayser Other Clinician: Referring Provider: Treating Provider/Extender: Ermalinda Barrios in Treatment: 2 Information Obtained from: Patient Chief Complaint 02/23/2023; left lateral leg wound, left lateral ankle wound Electronic Signature(s) Signed: 03/13/2023 4:51:26 PM By: Geralyn Corwin DO Entered By: Geralyn Corwin on 03/13/2023 16:13:02 -------------------------------------------------------------------------------- HPI Details Patient Name: Date of Service: Karen Jackson [place] F. 03/13/2023 3:00 PM Medical Record Number: [id number] Patient Account Number: [id number] Date of Birth/Sex: Treating RN: 14-Jan-1941 (82 y.o. F) Primary Care Provider: Ezequiel Kayser Other Clinician: Referring Provider: Treating Provider/Extender: Ermalinda Barrios in Treatment: 2 History of Present Illness HPI Description: 02/23/2023 Ms. 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 T the left leg she has been following her primary care physician office for this issue. She is not quite sure how it started. o She has had 2 rounds of doxycycline. Currently she is not doing any wound care dressing but keeping the area covered. The left lateral ankle wound has been an ongoing issue for the patient over the past  year. She follows with Dr. Victorino Dike who did a left ankle ORIF on 07/20/2018. She is had waxing and waning in wound healing to this area. She reports chronic pain to this site however denies any increased warmth, erythema or purulent drainage. 7/5; patient presents for follow-up. A wound culture done at last clinic visit grew Proteus mirabilis and Staphylococcus warneri. This is sensitive to ciprofloxacin. She has been using antibiotic ointment with Hydrofera Blue to the wound beds. She has not been scheduled for her CT scan of the left ankle. She denies systemic signs of infection. 7/16; patient presents for follow-up. She completed her course of ciprofloxacin. She has been using antibiotic ointment with Hydrofera Blue to the wound beds. She had her CT scan done last week however this has not been read. She again has thick yellow drainage and serosanguineous drainage from the left lateral leg. Electronic Signature(s) Signed: 03/13/2023 4:51:26 PM By: Geralyn Corwin DO Entered By: Geralyn Corwin on 03/13/2023 16:13:56 Karen Jackson ([id number]) [id number] Page 2 of 8 -------------------------------------------------------------------------------- Physical Exam Details Patient Name: Date of Service: Karen Jackson [place] F. 03/13/2023 3:00 PM Medical Record Number: [id number] Patient Account Number: [id number] Date of Birth/Sex: Treating RN: 1940-10-31 (82 y.o. F) Primary Care Provider: Ezequiel Kayser Other Clinician: Referring Provider: Treating Provider/Extender: Ermalinda Barrios in Treatment: 2 Constitutional respirations regular, non-labored and within target range for patient.. Cardiovascular 2+ dorsalis pedis/posterior tibialis pulses. Psychiatric pleasant and cooperative. Notes Left lower extremity: T the lateral aspect of the leg there is an open wound with Granulation tissue at the opening that is hyper granulated and on palpation o there  is thick yellow drainage and serosanguineous drainage. Evidence of venous stasis dermatitis. T the left lateral ankle there is an open pinpoint area that  o probes About 0.3 cm. Previous clinic visit probes straight to bone/metal plate. No signs of active soft tissue infection including increased warmth, erythema purulent drainage. Electronic Signature(s) Signed: 03/13/2023 4:51:26 PM By: Geralyn Corwin DO Entered By: Geralyn Corwin on 03/13/2023 16:25:21 -------------------------------------------------------------------------------- Physician Orders Details Patient Name: Date of Service: Karen [place] F. 03/13/2023 3:00 PM Medical Record Number: [id number] Patient Account Number: [id number] Date of Birth/Sex: Treating RN: 1941/03/09 (82 y.o. Arta Silence Primary Care Provider: Ezequiel Kayser Other Clinician: Referring Provider: Treating Provider/Extender: Ermalinda Barrios in Treatment: 2 Verbal / Phone Orders: No Diagnosis Coding Follow-up Appointments ppointment in 1 week. - Dr. Mikey Bussing 03/22/2023 2pm room 9 Thursday Return A ppointment in 2 weeks. - Dr. Mikey Bussing 03/29/2023 2pm room 9 Thursday Return A Other: - Pick up cipro from your pharmacy and start today. Will call you once we get the results of the CT scan. Anesthetic (In clinic) Topical Lidocaine 4% applied to wound bed Bathing/ Shower/ Hygiene May shower and wash wound with soap and water. Edema Control - Lymphedema / SCD / Other Bilateral Lower Extremities Elevate legs to the level of the heart or above for 30 minutes daily and/or when sitting for 3-4 times a day throughout the day. Avoid standing for long periods of time. Off-Loading Other: - Wear Bunny Boot/Prevalon Boot when in bed/sitting for long periods of time. Try and limit wearing left leg brace. Use the Tubigrip for now on the left leg and do not wear compression hose for now (02/23/23) Karen, Jackson ([id number])  [id number] Page 3 of 8 Wound Treatment Wound #1 - Lower Leg Wound Laterality: Left, Lateral Cleanser: Vashe 5.8 (oz) (Generic) 1 x Per Day/30 Days Discharge Instructions: Cleanse the wound with Vashe prior to applying a clean dressing using gauze sponges, not tissue or cotton balls. Topical: Mupirocin Ointment 1 x Per Day/30 Days Discharge Instructions: Apply Mupirocin (Bactroban) first then place Hydrafera blue Ready Prim Dressing: Hydrofera Blue Ready Transfer Foam, 2.5x2.5 (in/in) (Generic) 1 x Per Day/30 Days ary Discharge Instructions: Apply over Mupirocin then apply Hydrafera Blue Secondary Dressing: Bordered Gauze, 2x2 in (Generic) 1 x Per Day/30 Days Discharge Instructions: Apply over primary dressing Hydrafera Blue Secured With: Tubigrip E 1 x Per Day/30 Days Wound #2 - Ankle Wound Laterality: Left, Lateral Cleanser: Vashe 5.8 (oz) (Generic) 1 x Per Day/30 Days Discharge Instructions: Cleanse the wound with Vashe prior to applying a clean dressing using gauze sponges, not tissue or cotton balls. Topical: Mupirocin Ointment 1 x Per Day/30 Days Discharge Instructions: Apply Mupirocin (Bactroban) first then place Hydrafera blue Ready Prim Dressing: Hydrofera Blue Ready Transfer Foam, 2.5x2.5 (in/in) (Generic) 1 x Per Day/30 Days ary Discharge Instructions: Apply over Mupirocin then apply Hydrafera Blue Secondary Dressing: Bordered Gauze, 2x2 in (Generic) 1 x Per Day/30 Days Discharge Instructions: Apply over primary dressing Hydrafera Blue Secured With: Tubigrip E 1 x Per Day/30 Days Patient Medications llergies: No Known Drug Allergies A Notifications Medication Indication Start End 03/13/2023 ciprofloxacin HCl DOSE 1 - oral 500 mg tablet - 1 tablet oral twice a day x 7 days Electronic Signature(s) Signed: 03/13/2023 4:51:26 PM By: Geralyn Corwin DO Previous Signature: 03/13/2023 4:05:26 PM Version By: Geralyn Corwin DO Entered By: Geralyn Corwin on 03/13/2023 16:25:32 -------------------------------------------------------------------------------- Problem List Details Patient Name: Date of Service: Karen [place] F. 03/13/2023 3:00 PM Medical Record Number: [id number] Patient Account Number: [id number] Date of Birth/Sex: Treating RN: 1940-11-14 (82 y.o. F) Primary Care Provider:  Ezequiel Kayser Other Clinician: Referring Provider: Treating Provider/Extender: Ermalinda Barrios in Treatment: 2 Active Problems ICD-10 Encounter Code Description Active Date MDM Diagnosis [phone] Non-pressure chronic ulcer of other part of left lower leg with fat layer exposed 02/23/2023 No Yes I87.312 Chronic venous hypertension (idiopathic) with ulcer of left lower extremity 02/23/2023 No Yes Karen Jackson, Karen Jackson ([id number]) [id number] Page 4 of 8 L89.524 Pressure ulcer of left ankle, stage 4 02/23/2023 No Yes E11.621 Type 2 diabetes mellitus with foot ulcer 02/23/2023 No Yes I67.9 Cerebrovascular disease, unspecified 02/23/2023 No Yes I69.952 Hemiplegia and hemiparesis following unspecified cerebrovascular disease 02/23/2023 No Yes affecting left dominant side I48.19 Other persistent atrial fibrillation 02/23/2023 No Yes Z79.01 Long term (current) use of anticoagulants 02/23/2023 No Yes Inactive Problems Resolved Problems Electronic Signature(s) Signed: 03/13/2023 4:51:26 PM By: Geralyn Corwin DO Entered By: Geralyn Corwin on 03/13/2023 16:12:40 -------------------------------------------------------------------------------- Progress Note Details Patient Name: Date of Service: Karen [place] F. 03/13/2023 3:00 PM Medical Record Number: [id number] Patient Account Number: [id number] Date of Birth/Sex: Treating RN: 12-Mar-1941 (82 y.o. F) Primary Care Provider: Ezequiel Kayser Other Clinician: Referring Provider: Treating Provider/Extender: Ermalinda Barrios in Treatment:  2 Subjective Chief Complaint Information obtained from Patient 02/23/2023; left lateral leg wound, left lateral ankle wound History of Present Illness (HPI) 02/23/2023 Ms. 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 T the left leg she has been following her primary care physician office for this issue. She is not quite sure how it started. o She has had 2 rounds of doxycycline. Currently she is not doing any wound care dressing but keeping the area covered. The left lateral ankle wound has been an ongoing issue for the patient over the past year. She follows with Dr. Victorino Dike who did a left ankle ORIF on 07/20/2018. She is had waxing and waning in wound healing to this area. She reports chronic pain to this site however denies any increased warmth, erythema or purulent drainage. 7/5; patient presents for follow-up. A wound culture done at last clinic visit grew Proteus mirabilis and Staphylococcus warneri. This is sensitive to ciprofloxacin. She has been using antibiotic ointment with Hydrofera Blue to the wound beds. She has not been scheduled for her CT scan of the left ankle. She denies systemic signs of infection. 7/16; patient presents for follow-up. She completed her course of ciprofloxacin. She has been using antibiotic ointment with Hydrofera Blue to the wound beds. She had her CT scan done last week however this has not been read. She again has thick yellow drainage and serosanguineous drainage from the left lateral leg. Patient History Karen Jackson, Karen Jackson ([id number]) [id number] Page 5 of 8 Information obtained from Patient, Chart. Family History Cancer -  Mother,Siblings. Social History Never smoker, Marital Status - Widowed, Alcohol Use - Never, Drug Use - No History, Caffeine Use - Never. Medical History Cardiovascular Patient has history of Arrhythmia - Hx A. fib, Congestive Heart Failure Endocrine Denies history of Type II Diabetes Musculoskeletal Patient has history of Osteoarthritis Hospitalization/Surgery History - bilat knee replacement. - right ankle replacement. Medical A Surgical History Notes nd Constitutional Symptoms (  General Health) Stroke Respiratory pulmonary HTN Gastrointestinal GERD DIVERTICULITIS Neurologic hemiparesis left side late affect of CVA Objective Constitutional respirations regular, non-labored and within target range for patient.. Vitals Time Taken: 3:09 PM, Height: 58 in, Weight: 160 lbs, BMI: 33.4, Temperature: 97.8 F, Pulse: 74 bpm, Respiratory Rate: 18 breaths/min, Blood Pressure: 153/94 mmHg. Cardiovascular 2+ dorsalis pedis/posterior tibialis pulses. Psychiatric pleasant and cooperative. General Notes: Left lower extremity: T the lateral aspect of the leg there is an open wound with Granulation tissue at the opening that is hyper granulated and o on palpation there is thick yellow drainage and serosanguineous drainage. Evidence of venous stasis dermatitis. T the left lateral ankle there is an open o pinpoint area that probes About 0.3 cm. Previous clinic visit probes straight to bone/metal plate. No signs of active soft tissue infection including increased warmth, erythema purulent drainage. Integumentary (Hair, Skin) Wound #1 status is Open. Original cause of wound was Gradually Appeared. The date acquired was: 12/27/2022. The wound has been in treatment 2 weeks. The wound is located on the Left,Lateral Lower Leg. The wound measures 0.3cm length x 0.2cm width x 0.1cm depth; 0.047cm^2 area and 0.005cm^3 volume. There is Fat Layer (Subcutaneous Tissue) exposed. There is no tunneling noted,  however, there is undermining starting at 12:00 and ending at 12:00 with a maximum distance of 1cm. There is a medium amount of purulent drainage noted. The wound margin is distinct with the outline attached to the wound base. There is medium (34-66%) red, pink, hyper - granulation within the wound bed. There is a medium (34-66%) amount of necrotic tissue within the wound bed including Adherent Slough. The periwound skin appearance exhibited: Scarring. The periwound skin appearance did not exhibit: Callus, Crepitus, Excoriation, Induration, Rash, Dry/Scaly, Maceration, Atrophie Blanche, Cyanosis, Ecchymosis, Hemosiderin Staining, Mottled, Pallor, Rubor, Erythema. Wound #2 status is Open. Original cause of wound was Pressure Injury. The date acquired was: 02/22/2023. The wound has been in treatment 2 weeks. The wound is located on the Left,Lateral Ankle. The wound measures 0.1cm length x 0.1cm width x 0.3cm depth; 0.008cm^2 area and 0.002cm^3 volume. There is bone and Fat Layer (Subcutaneous Tissue) exposed. There is no tunneling or undermining noted. There is a medium amount of serosanguineous drainage noted. The wound margin is distinct with the outline attached to the wound base. There is small (1-33%) pink granulation within the wound bed. There is a large (67-100%) amount of necrotic tissue within the wound bed including Eschar. The periwound skin appearance had no abnormalities noted for texture. The periwound skin appearance had no abnormalities noted for moisture. The periwound skin appearance had no abnormalities noted for color. The periwound has tenderness on palpation. Assessment Active Problems ICD-10 Non-pressure chronic ulcer of other part of left lower leg with fat layer exposed Chronic venous hypertension (idiopathic) with ulcer of left lower extremity Pressure ulcer of left ankle, stage 4 Type 2 diabetes mellitus with foot ulcer Cerebrovascular disease, unspecified Hemiplegia and  hemiparesis following unspecified cerebrovascular disease affecting left dominant side Other persistent atrial fibrillation Karen Jackson, Karen Jackson ([id number]) [id number] Page 6 of 8 Long term (current) use of anticoagulants Patient's left ankle wound is stable however cannot probe to bone today. CT scan has still not been read. Unfortunately the left lateral lower extremity wound has presented with drainage again. I recommended restarting oral antibiotics as this appears to be infectious. She will likely need an x-ray but I will look at the CT scan images to see if they were able to capture  this area. I recommended continuing with antibiotic ointment and Hydrofera Blue to the wound beds. Continue aggressive offloading. Addendum 7/17: CT was read with findings below. There is an abscess located to the superior aspect of the hardware. This explains the nonhealing nature of the left lateral leg wound. Despite several weeks of antibiotics patient's wound has not healed. Will refer back to Dr. Victorino Dike who did the original ORIF surgery For potential hardware removal. CT scan results of the left lower leg/ankle 1. Skin wound about the anterolateral aspect of the distal fibula about the superior aspect of the hardware with a fluid collection measuring 3.1 x 0.9 x 4.1 cm with small focus of emphysema suggesting abscess. There is edema and skin thickening about the ulcer suggesting cellulitis. 2. Status post ORIF plate and screw fixation of the distal fibula. No perihardware loosening or perihardware fracture. Plan Follow-up Appointments: Return Appointment in 1 week. - Dr. Mikey Bussing 03/22/2023 2pm room 9 Thursday Return Appointment in 2 weeks. - Dr. Mikey Bussing 03/29/2023 2pm room 9 Thursday Other: - Pick up cipro from your pharmacy and start today. Will call you once we get the results of the CT scan. Anesthetic: (In clinic) Topical Lidocaine 4% applied to wound bed Bathing/ Shower/  Hygiene: May shower and wash wound with soap and water. Edema Control - Lymphedema / SCD / Other: Elevate legs to the level of the heart or above for 30 minutes daily and/or when sitting for 3-4 times a day throughout the day. Avoid standing for long periods of time. Off-Loading: Other: - Wear Bunny Boot/Prevalon Boot when in bed/sitting for long periods of time. Try and limit wearing left leg brace. Use the Tubigrip for now on the left leg and do not wear compression hose for now (02/23/23) The following medication(s) was prescribed: ciprofloxacin HCl oral 500 mg tablet 1 1 tablet oral twice a day x 7 days starting 03/13/2023 WOUND #1: - Lower Leg Wound Laterality: Left, Lateral Cleanser: Vashe 5.8 (oz) (Generic) 1 x Per Day/30 Days Discharge Instructions: Cleanse the wound with Vashe prior to applying a clean dressing using gauze sponges, not tissue or cotton balls. Topical: Mupirocin Ointment 1 x Per Day/30 Days Discharge Instructions: Apply Mupirocin (Bactroban) first then place Hydrafera blue Ready Prim Dressing: Hydrofera Blue Ready Transfer Foam, 2.5x2.5 (in/in) (Generic) 1 x Per Day/30 Days ary Discharge Instructions: Apply over Mupirocin then apply Hydrafera Blue Secondary Dressing: Bordered Gauze, 2x2 in (Generic) 1 x Per Day/30 Days Discharge Instructions: Apply over primary dressing Hydrafera Blue Secured With: Tubigrip E 1 x Per Day/30 Days WOUND #2: - Ankle Wound Laterality: Left, Lateral Cleanser: Vashe 5.8 (oz) (Generic) 1 x Per Day/30 Days Discharge Instructions: Cleanse the wound with Vashe prior to applying a clean dressing using gauze sponges, not tissue or cotton balls. Topical: Mupirocin Ointment 1 x Per Day/30 Days Discharge Instructions: Apply Mupirocin (Bactroban) first then place Hydrafera blue Ready Prim Dressing: Hydrofera Blue Ready Transfer Foam, 2.5x2.5 (in/in) (Generic) 1 x Per Day/30 Days ary Discharge Instructions: Apply over Mupirocin then apply Hydrafera  Blue Secondary Dressing: Bordered Gauze, 2x2 in (Generic) 1 x Per Day/30 Days Discharge Instructions: Apply over primary dressing Hydrafera Blue Secured With: Tubigrip E 1 x Per Day/30 Days 1. Ciprofloxacin 2. Follow-up CT scan 3. Aggressive offloadingPrevalon boot 4. Hydrofera Blue and mupirocin ointment Electronic Signature(s) Signed: 03/15/2023 4:16:47 PM By: Geralyn Corwin DO Previous Signature: 03/13/2023 4:51:26 PM Version By: Geralyn Corwin DO Entered By: Geralyn Corwin on 03/14/2023 13:08:41 Karen Jackson ([id number]) [id number]  Page 7 of 8 -------------------------------------------------------------------------------- HxROS Details Patient Name: Date of Service: Karen Jackson [place] F. 03/13/2023 3:00 PM Medical Record Number: [id number] Patient Account Number: [id number] Date of Birth/Sex: Treating RN: [birth year] (82 y.o. F) Primary Care Provider: Ezequiel Kayser Other Clinician: Referring Provider: Treating Provider/Extender: Ermalinda Barrios in Treatment: 2 Information Obtained From Patient Chart Constitutional Symptoms (General Health) Medical History: Past Medical History Notes: Stroke Respiratory Medical History: Past Medical History Notes: pulmonary HTN Cardiovascular Medical History: Positive for: Arrhythmia - Hx A. fib; Congestive Heart Failure Gastrointestinal Medical History: Past Medical History Notes: GERD DIVERTICULITIS Endocrine Medical History: Negative for: Type II Diabetes Musculoskeletal Medical History: Positive for: Osteoarthritis Neurologic Medical History: Past Medical History Notes: hemiparesis left side late affect of CVA Immunizations Pneumococcal Vaccine: Received Pneumococcal Vaccination: Yes Received Pneumococcal Vaccination On or After 60th Birthday: Yes Implantable Devices No devices added Hospitalization / Surgery History Type of Hospitalization/Surgery bilat knee  replacement right ankle replacement Family and Social History Cancer: Yes - Mother,Siblings; Never smoker; Marital Status - Widowed; Alcohol Use: Never; Drug Use: No History; Caffeine Use: Never; Financial Concerns: No; Food, Clothing or Shelter Needs: No; Support System Lacking: No; Transportation Concerns: No Karen Jackson, Karen Jackson ([id number]) [id number] Page 8 of 8 Electronic Signature(s) Signed: 03/13/2023 4:51:26 PM By: Geralyn Corwin DO Entered By: Geralyn Corwin on 03/13/2023 16:14:00 -------------------------------------------------------------------------------- SuperBill Details Patient Name: Date of Service: Karen Jackson [place] F. 03/13/2023 Medical Record Number: [id number] Patient Account Number: [id number] Date of Birth/Sex: Treating RN: [birth year] (82 y.o. [name], Millard.Loa Primary Care Provider: Ezequiel Kayser Other Clinician: Referring Provider: Treating Provider/Extender: Ermalinda Barrios in Treatment: 2 Diagnosis Coding ICD-10 Codes Code Description [phone] Non-pressure chronic ulcer of other part of left lower leg with fat layer exposed I87.312 Chronic venous hypertension (idiopathic) with ulcer of left lower extremity L89.524 Pressure ulcer of left ankle, stage 4 E11.621 Type 2 diabetes mellitus with foot ulcer I67.9 Cerebrovascular disease, unspecified I69.952 Hemiplegia and hemiparesis following unspecified cerebrovascular disease affecting left dominant side I48.19 Other persistent atrial fibrillation Z79.01 Long term (current) use of anticoagulants Facility Procedures : CPT4 Code: [id number] Description: 99214 - WOUND CARE VISIT-LEV 4 EST PT Modifier: Quantity: 1 Physician Procedures : CPT4 Code Description Modifier [id number] 99214 - WC PHYS LEVEL 4 - EST PT ICD-10 Diagnosis Description L97.822 Non-pressure chronic ulcer of other part of left lower leg with fat layer exposed I87.312 Chronic venous hypertension (idiopathic)  with ulcer  of left lower extremity L89.524 Pressure ulcer of left ankle, stage 4 E11.621 Type 2 diabetes mellitus with foot ulcer Quantity: 1 Electronic Signature(s) Signed: 03/15/2023 4:16:47 PM By: Geralyn Corwin DO Previous Signature: 03/13/2023 4:51:26 PM Version By: Geralyn Corwin DO Entered By: Geralyn Corwin on 03/14/2023 13:08:49

## 2023-03-14 NOTE — Progress Notes (Signed)
Karen Jackson, Karen Jackson ([id number]) [id number] Page 1 of 11 Visit Report for 03/13/2023 Arrival Information Details Patient Name: Date of Service: Karen Jackson. 03/13/2023 3:00 PM Medical Record Number: [id number] Patient Account Number: [id number] Date of Birth/Sex: Treating RN: February 07, 1941 (82 y.o. Karen Jackson Primary Care : Ezequiel Kayser Other Clinician: Referring : Treating /Extender: Ermalinda Barrios in Treatment: 2 Visit Information History Since Last Visit All ordered tests and consults were completed: Yes Patient Arrived: Wheel Chair Added or deleted any medications: No Arrival Time: 15:08 Any new allergies or adverse reactions: No Accompanied By: niece Had a fall or experienced change in No Transfer Assistance: EasyPivot Patient Lift activities of daily living that may affect Patient Requires Transmission-Based Precautions: No risk of falls: Patient Has Alerts: Yes Signs or symptoms of abuse/neglect since last visito No Patient Alerts: Patient on Blood Thinner Hospitalized since last visit: No Eliquis Implantable device outside of the clinic excluding No cellular tissue based products placed in the center since last visit: Has Dressing in Place as Prescribed: Yes Pain Present Now: Yes Electronic Signature(s) Signed: 03/14/2023 7:59:22 AM By: Brenton Grills Entered By: Brenton Grills on 03/13/2023 15:09:27 -------------------------------------------------------------------------------- Clinic Level of Care Assessment Details Patient Name: Date of Service: Karen Jackson. 03/13/2023 3:00 PM Medical Record Number: [id number] Patient Account Number: [id number] Date of Birth/Sex: Treating RN: [birth year] (82 y.o. Arta Silence Primary Care : Ezequiel Kayser Other Clinician: Referring : Treating /Extender: Ermalinda Barrios in Treatment: 2 Clinic Level of  Care Assessment Items TOOL 4 Quantity Score X- 1 0 Use when only an EandM is performed on FOLLOW-UP visit ASSESSMENTS - Nursing Assessment / Reassessment X- 1 10 Reassessment of Co-morbidities (includes updates in patient status) X- 1 5 Reassessment of Adherence to Treatment Plan ASSESSMENTS - Wound and Skin A ssessment / Reassessment []  - 0 Simple Wound Assessment / Reassessment - one wound X- 2 5 Complex Wound Assessment / Reassessment - multiple wounds []  - 0 Dermatologic / Skin Assessment (not related to wound area) ASSESSMENTS - Focused Assessment X- 1 5 Circumferential Edema Measurements - multi extremities []  - 0 Nutritional Assessment / Counseling / Intervention Karen Jackson, Karen Jackson ([id number]) [id number] Page 2 of 11 []  - 0 Lower Extremity Assessment (monofilament, tuning fork, pulses) []  - 0 Peripheral Arterial Disease Assessment (using hand held doppler) ASSESSMENTS - Ostomy and/or Continence Assessment and Care []  - 0 Incontinence Assessment and Management []  - 0 Ostomy Care Assessment and Management (repouching, etc.) PROCESS - Coordination of Care []  - 0 Simple Patient / Family Education for ongoing care X- 1 20 Complex (extensive) Patient / Family Education for ongoing care X- 1 10 Staff obtains Chiropractor, Records, T Results / Process Orders est []  - 0 Staff telephones HHA, Nursing Homes / Clarify orders / etc []  - 0 Routine Transfer to another Facility (non-emergent condition) []  - 0 Routine Hospital Admission (non-emergent condition) []  - 0 New Admissions / Manufacturing engineer / Ordering NPWT Apligraf, etc. , []  - 0 Emergency Hospital Admission (emergent condition) []  - 0 Simple Discharge Coordination X- 1 15 Complex (extensive) Discharge Coordination PROCESS - Special Needs []  - 0 Pediatric / Minor Patient Management []  - 0 Isolation Patient Management []  - 0 Hearing / Language / Visual special needs []  -  0 Assessment of Community assistance (transportation, D/C planning, etc.) []  - 0 Additional assistance / Altered mentation []  - 0 Support Surface(s) Assessment (bed, cushion, seat, etc.)  INTERVENTIONS - Wound Cleansing / Measurement []  - 0 Simple Wound Cleansing - one wound X- 2 5 Complex Wound Cleansing - multiple wounds X- 1 5 Wound Imaging (photographs - any number of wounds) []  - 0 Wound Tracing (instead of photographs) []  - 0 Simple Wound Measurement - one wound X- 2 5 Complex Wound Measurement - multiple wounds INTERVENTIONS - Wound Dressings X - Small Wound Dressing one or multiple wounds 2 10 []  - 0 Medium Wound Dressing one or multiple wounds []  - 0 Large Wound Dressing one or multiple wounds []  - 0 Application of Medications - topical []  - 0 Application of Medications - injection INTERVENTIONS - Miscellaneous []  - 0 External ear exam []  - 0 Specimen Collection (cultures, biopsies, blood, body fluids, etc.) []  - 0 Specimen(s) / Culture(s) sent or taken to Lab for analysis []  - 0 Patient Transfer (multiple staff / Nurse, adult / Similar devices) []  - 0 Simple Staple / Suture removal (25 or less) []  - 0 Complex Staple / Suture removal (26 or more) []  - 0 Hypo / Hyperglycemic Management (close monitor of Blood Glucose) Karen Jackson, Karen Jackson ([id number]) [id number] Page 3 of 11 []  - 0 Ankle / Brachial Index (ABI) - do not check if billed separately X- 1 5 Vital Signs Has the patient been seen at the hospital within the last three years: Yes Total Score: 125 Level Of Care: New/Established - Level 4 Electronic Signature(s) Signed: 03/13/2023 6:16:33 PM By: Shawn Stall RN, BSN Entered By: Shawn Stall on 03/13/2023 15:54:23 -------------------------------------------------------------------------------- Encounter Discharge Information Details Patient Name: Date of Service: Karen [place] Jackson. 03/13/2023 3:00 PM Medical Record Number:  [id number] Patient Account Number: [id number] Date of Birth/Sex: Treating RN: 1940-09-01 (82 y.o. Arta Silence Primary Care : Ezequiel Kayser Other Clinician: Referring : Treating /Extender: Ermalinda Barrios in Treatment: 2 Encounter Discharge Information Items Discharge Condition: Stable Ambulatory Status: Wheelchair Discharge Destination: Home Transportation: Private Auto Accompanied By: daughter Schedule Follow-up Appointment: Yes Clinical Summary of Care: Electronic Signature(s) Signed: 03/13/2023 6:16:33 PM By: Shawn Stall RN, BSN Entered By: Shawn Stall on 03/13/2023 15:55:28 -------------------------------------------------------------------------------- Lower Extremity Assessment Details Patient Name: Date of Service: Karen [place] Jackson. 03/13/2023 3:00 PM Medical Record Number: [id number] Patient Account Number: [id number] Date of Birth/Sex: Treating RN: 1941-02-20 (82 y.o. Karen Jackson Primary Care : Ezequiel Kayser Other Clinician: Referring : Treating /Extender: Levell July Weeks in Treatment: 2 Edema Assessment Assessed: [Left: No] [Right: No] [Left: Edema] [Right: :] Calf Left: Right: Point of Measurement: From Medial Instep 30 cm Ankle Left: Right: Point of Measurement: From Medial Instep 20.5 cm Vascular Assessment Left: [[id number] Page 4 of 11Right:] Pulses: Dorsalis Pedis Palpable: [[id number] Page 4 of 11Yes] Extremity colors, hair growth, and conditions: Extremity Color: [[id number] Page 4 of 11Normal] Hair Growth on Extremity: [[id number] Page 4 of 11No] Temperature of Extremity: [[id number] Page 4 of 11Warm] Capillary Refill: [phone].pdf Page 4 of 11< 3 seconds] Dependent Rubor:  [phone] Page 4 of 11No] Blanched when Elevated: [phone].pdf Page 4 of 11No No] Toe Nail Assessment Left: Right: Thick: No Discolored: No Deformed: No Improper Length and Hygiene: No Electronic Signature(s) Signed: 03/14/2023 7:59:22 AM By: Brenton Grills Entered By: Brenton Grills on 03/13/2023 15:17:40 -------------------------------------------------------------------------------- Multi Wound Chart Details Patient Name: Date of Service: Karen [place] Jackson. 03/13/2023 3:00 PM Medical Record Number: [id number] Patient Account Number: [id number] Date of Birth/Sex: Treating RN: Aug 15, 1941 (82 y.o. Jackson) Primary Care  : Ezequiel Kayser Other Clinician: Referring : Treating /Extender: Ermalinda Barrios in Treatment: 2 Vital Signs Height(in): 58 Pulse(bpm): 74 Weight(lbs): 160 Blood Pressure(mmHg): 153/94 Body Mass Index(BMI): 33.4 Temperature(Jackson): 97.8 Respiratory Rate(breaths/min): 18 [1:Photos:] [N/A:N/A] Left, Lateral Lower Leg Left, Lateral Ankle N/A Wound Location: Gradually Appeared Pressure Injury N/A Wounding Event: Venous Leg Ulcer Pressure Ulcer N/A Primary Etiology: Abscess N/A N/A Secondary Etiology: Arrhythmia, Congestive Heart Failure, Arrhythmia, Congestive Heart Failure, N/A Comorbid History: Osteoarthritis Osteoarthritis 12/27/2022 02/22/2023 N/A Date Acquired: 2 2 N/A Weeks of Treatment: Open Open N/A Wound Status: No No N/A Wound Recurrence: 0.3x0.2x0.1 0.1x0.1x0.3 N/A Measurements L x W x D (cm) 0.047 0.008 N/A A (cm) : rea 0.005 0.002 N/A Volume (cm) : 90.50% 96.80% N/A % Reduction in A rea: 98.90% 99.20% N/A % Reduction in Volume: 12 Starting Position 1 (o'clock): IZEL, HOCHBERG ([id number]) [id number] Page 5 of 11 12 Ending Position 1 (o'clock): 1 Maximum Distance 1 (cm): Yes No N/A Undermining: Full Thickness Without  Exposed Category/Stage IV N/A Classification: Support Structures Medium Medium N/A Exudate A mount: Purulent Serosanguineous N/A Exudate Type: yellow, brown, green red, brown N/A Exudate Color: Distinct, outline attached Distinct, outline attached N/A Wound Margin: Medium (34-66%) Small (1-33%) N/A Granulation Amount: Red, Pink, Hyper-granulation Pink N/A Granulation Quality: Medium (34-66%) Large (67-100%) N/A Necrotic Amount: Adherent Slough Eschar N/A Necrotic Tissue: Fat Layer (Subcutaneous Tissue): Yes Fat Layer (Subcutaneous Tissue): Yes N/A Exposed Structures: Fascia: No Bone: Yes Tendon: No Muscle: No Joint: No Bone: No Small (1-33%) Small (1-33%) N/A Epithelialization: Scarring: Yes No Abnormalities Noted N/A Periwound Skin Texture: Excoriation: No Induration: No Callus: No Crepitus: No Rash: No Maceration: No No Abnormalities Noted N/A Periwound Skin Moisture: Dry/Scaly: No Atrophie Blanche: No No Abnormalities Noted N/A Periwound Skin Color: Cyanosis: No Ecchymosis: No Erythema: No Hemosiderin Staining: No Mottled: No Pallor: No Rubor: No N/A Yes N/A Tenderness on Palpation: Treatment Notes Wound #1 (Lower Leg) Wound Laterality: Left, Lateral Cleanser Vashe 5.8 (oz) Discharge Instruction: Cleanse the wound with Vashe prior to applying a clean dressing using gauze sponges, not tissue or cotton balls. Peri-Wound Care Topical Mupirocin Ointment Discharge Instruction: Apply Mupirocin (Bactroban) first then place Hydrafera blue Ready Primary Dressing Hydrofera Blue Ready Transfer Foam, 2.5x2.5 (in/in) Discharge Instruction: Apply over Mupirocin then apply Hydrafera Blue Secondary Dressing Bordered Gauze, 2x2 in Discharge Instruction: Apply over primary dressing Hydrafera Blue Secured With Tubigrip E Compression Wrap Compression Stockings Add-Ons Wound #2 (Ankle) Wound Laterality: Left, Lateral Cleanser Vashe 5.8 (oz) Discharge  Instruction: Cleanse the wound with Vashe prior to applying a clean dressing using gauze sponges, not tissue or cotton balls. Peri-Wound Care Topical Mupirocin Ointment Discharge Instruction: Apply Mupirocin (Bactroban) first then place [facility] blue Ready [address] Karen Jackson, Karen Jackson ([id number]) [id number] Page 6 of 11 Hydrofera Blue Ready Transfer Foam, 2.5x2.5 (in/in) Discharge Instruction: Apply over Mupirocin then apply Hydrafera Blue Secondary Dressing Bordered Gauze, 2x2 in Discharge Instruction: Apply over primary dressing Hydrafera Blue Secured With Tubigrip E Compression Wrap Compression Stockings Add-Ons Electronic Signature(s) Signed: 03/13/2023 4:51:26 PM By: Geralyn Corwin DO Entered By: Geralyn Corwin on 03/13/2023 16:12:53 -------------------------------------------------------------------------------- Multi-Disciplinary Care Plan Details Patient Name: Date of Service: Karen [place] Jackson. 03/13/2023 3:00 PM Medical Record Number: [id number] Patient Account Number: [id number] Date of Birth/Sex: Treating RN: 04/12/1941 (82 y.o. Arta Silence Primary Care : Ezequiel Kayser Other Clinician: Referring : Treating /Extender: Ermalinda Barrios in Treatment: 2 Active Inactive Wound/Skin Impairment Nursing  Diagnoses: Impaired tissue integrity Goals: Patient/caregiver will verbalize understanding of skin care regimen Date Initiated: 02/23/2023 Target Resolution Date: 05/28/2023 Goal Status: Active Interventions: Assess ulceration(s) every visit Treatment Activities: Skin care regimen initiated : 02/23/2023 Notes: Electronic Signature(s) Signed: 03/13/2023 6:16:33 PM By: Shawn Stall RN, BSN Entered By: Shawn Stall on 03/13/2023 15:53:30 -------------------------------------------------------------------------------- Pain Assessment Details Patient Name: Date of Service: Karen [place] Jackson.  03/13/2023 3:00 PM Medical Record Number: [id number] Patient Account Number: [id number] Karen Jackson, Karen Jackson ([id number]) [phone].pdf Page 7 of 11 Date of Birth/Sex: Treating RN: [birth year] (82 y.o. Karen Jackson Primary Care : Other Clinician: Ezequiel Kayser Referring : Treating /Extender: Ermalinda Barrios in Treatment: 2 Active Problems Location of Pain Severity and Description of Pain Patient Has Paino Yes Site Locations Rate the pain. Current Pain Level: 7 Pain Management and Medication Current Pain Management: Electronic Signature(s) Signed: 03/14/2023 7:59:22 AM By: Brenton Grills Entered By: Brenton Grills on 03/13/2023 15:12:03 -------------------------------------------------------------------------------- Patient/Caregiver Education Details Patient Name: Date of Service: Karen Jackson. 7/16/2024andnbsp3:00 PM Medical Record Number: [id number] Patient Account Number: [id number] Date of Birth/Gender: Treating RN: Aug 06, 1941 (82 y.o. Arta Silence Primary Care Physician: Ezequiel Kayser Other Clinician: Referring Physician: Treating Physician/Extender: Ermalinda Barrios in Treatment: 2 Education Assessment Education Provided To: Patient Education Topics Provided Wound/Skin Impairment: Handouts: Caring for Your Ulcer Methods: Explain/Verbal Responses: Reinforcements needed Electronic Signature(s) Signed: 03/13/2023 6:16:33 PM By: Shawn Stall RN, BSN Entered By: Shawn Stall on 03/13/2023 15:53:43 Andrey Campanile ([id number]) [id number] Page 8 of 11 -------------------------------------------------------------------------------- Wound Assessment Details Patient Name: Date of Service: Karen Jackson. 03/13/2023 3:00 PM Medical Record Number: [id number] Patient Account Number: [id number] Date of Birth/Sex: Treating RN: 1941-08-22 (82 y.o. Karen Jackson Primary Care : Ezequiel Kayser Other Clinician: Referring : Treating /Extender: Levell July Weeks in Treatment: 2 Wound Status Wound Number: 1 Primary Etiology: Venous Leg Ulcer Wound Location: Left, Lateral Lower Leg Secondary Etiology: Abscess Wounding Event: Gradually Appeared Wound Status: Open Date Acquired: 12/27/2022 Comorbid History: Arrhythmia, Congestive Heart Failure, Osteoarthritis Weeks Of Treatment: 2 Clustered Wound: No Photos Wound Measurements Length: (cm) 0.3 Width: (cm) 0.2 Depth: (cm) 0.1 Area: (cm) 0.047 Volume: (cm) 0.005 % Reduction in Area: 90.5% % Reduction in Volume: 98.9% Epithelialization: Small (1-33%) Tunneling: No Undermining: Yes Starting Position (o'clock): 12 Ending Position (o'clock): 12 Maximum Distance: (cm) 1 Wound Description Classification: Full Thickness Without Exposed Suppor Wound Margin: Distinct, outline attached Exudate Amount: Medium Exudate Type: Purulent Exudate Color: yellow, brown, green t Structures Foul Odor After Cleansing: No Slough/Fibrino Yes Wound Bed Granulation Amount: Medium (34-66%) Exposed Structure Granulation Quality: Red, Pink, Hyper-granulation Fascia Exposed: No Necrotic Amount: Medium (34-66%) Fat Layer (Subcutaneous Tissue) Exposed: Yes Necrotic Quality: Adherent Slough Tendon Exposed: No Muscle Exposed: No Joint Exposed: No Bone Exposed: No Periwound Skin Texture Texture Color No Abnormalities Noted: No No Abnormalities Noted: No Callus: No Atrophie Blanche: No Crepitus: No Cyanosis: No Excoriation: No Ecchymosis: No Induration: No Erythema: No Rash: No Hemosiderin Staining: No Scarring: Yes Mottled: No Pallor: No [address] Karen Jackson, Karen Jackson ([id number]) [id number] Page 9 of 11 Rubor: No No Abnormalities Noted: No Dry / Scaly: No Maceration: No Treatment Notes Wound #1 (Lower Leg) Wound Laterality: Left,  Lateral Cleanser Vashe 5.8 (oz) Discharge Instruction: Cleanse the wound with Vashe prior to applying a clean dressing using gauze sponges, not tissue or cotton balls. Peri-Wound Care Topical Mupirocin Ointment Discharge  Instruction: Apply Mupirocin (Bactroban) first then place Hydrafera blue Ready Primary Dressing Hydrofera Blue Ready Transfer Foam, 2.5x2.5 (in/in) Discharge Instruction: Apply over Mupirocin then apply Hydrafera Blue Secondary Dressing Bordered Gauze, 2x2 in Discharge Instruction: Apply over primary dressing Hydrafera Blue Secured With Tubigrip E Compression Wrap Compression Stockings Add-Ons Electronic Signature(s) Signed: 03/13/2023 6:16:33 PM By: Shawn Stall RN, BSN Signed: 03/14/2023 7:59:22 AM By: Brenton Grills Entered By: Shawn Stall on 03/13/2023 15:51:36 -------------------------------------------------------------------------------- Wound Assessment Details Patient Name: Date of Service: Karen [place] Jackson. 03/13/2023 3:00 PM Medical Record Number: [id number] Patient Account Number: [id number] Date of Birth/Sex: Treating RN: 1941/02/18 (82 y.o. Arta Silence Primary Care : Ezequiel Kayser Other Clinician: Referring : Treating /Extender: Ermalinda Barrios in Treatment: 2 Wound Status Wound Number: 2 Primary Etiology: Pressure Ulcer Wound Location: Left, Lateral Ankle Wound Status: Open Wounding Event: Pressure Injury Comorbid History: Arrhythmia, Congestive Heart Failure, Osteoarthritis Date Acquired: 02/22/2023 Weeks Of Treatment: 2 Clustered Wound: No Wound under treatment by  outside of Wound Center Photos Karen Jackson, Karen Jackson ([id number]) [phone].pdf Page 10 of 11 Wound Measurements Length: (cm) 0.1 Width: (cm) 0.1 Depth: (cm) 0.3 Area: (cm) 0.008 Volume: (cm) 0.002 % Reduction in Area: 96.8% % Reduction in Volume: 99.2% Epithelialization: Small (1-33%) Tunneling:  No Undermining: No Wound Description Classification: Category/Stage IV Wound Margin: Distinct, outline attached Exudate Amount: Medium Exudate Type: Serosanguineous Exudate Color: red, brown Foul Odor After Cleansing: No Slough/Fibrino Yes Wound Bed Granulation Amount: Small (1-33%) Exposed Structure Granulation Quality: Pink Fat Layer (Subcutaneous Tissue) Exposed: Yes Necrotic Amount: Large (67-100%) Bone Exposed: Yes Necrotic Quality: Eschar Periwound Skin Texture Texture Color No Abnormalities Noted: Yes No Abnormalities Noted: Yes Moisture Temperature / Pain No Abnormalities Noted: Yes Tenderness on Palpation: Yes Treatment Notes Wound #2 (Ankle) Wound Laterality: Left, Lateral Cleanser Vashe 5.8 (oz) Discharge Instruction: Cleanse the wound with Vashe prior to applying a clean dressing using gauze sponges, not tissue or cotton balls. Peri-Wound Care Topical Mupirocin Ointment Discharge Instruction: Apply Mupirocin (Bactroban) first then place Hydrafera blue Ready Primary Dressing Hydrofera Blue Ready Transfer Foam, 2.5x2.5 (in/in) Discharge Instruction: Apply over Mupirocin then apply Hydrafera Blue Secondary Dressing Bordered Gauze, 2x2 in Discharge Instruction: Apply over primary dressing Hydrafera Blue Secured With Tubigrip E Compression Wrap Compression Stockings Add-Ons Electronic Signature(s) Signed: 03/13/2023 6:16:33 PM By: Shawn Stall RN, BSN Entered By: Shawn Stall on 03/13/2023 15:49:43 Andrey Campanile ([id number]) [id number] Page 11 of 11 -------------------------------------------------------------------------------- Vitals Details Patient Name: Date of Service: Karen Jackson. 03/13/2023 3:00 PM Medical Record Number: [id number] Patient Account Number: [id number] Date of Birth/Sex: Treating RN: 1940-12-14 (82 y.o. Karen Jackson Primary Care : Ezequiel Kayser Other Clinician: Referring : Treating  /Extender: Ermalinda Barrios in Treatment: 2 Vital Signs Time Taken: 15:09 Temperature (Jackson): 97.8 Height (in): 58 Pulse (bpm): 74 Weight (lbs): 160 Respiratory Rate (breaths/min): 18 Body Mass Index (BMI): 33.4 Blood Pressure (mmHg): 153/94 Reference Range: 80 - 120 mg / dl Electronic Signature(s) Signed: 03/14/2023 7:59:22 AM By: Brenton Grills Entered By: Brenton Grills on 03/13/2023 15:11:29

## 2023-03-15 ENCOUNTER — Ambulatory Visit: Payer: Medicare Other | Admitting: Physical Therapy

## 2023-03-19 ENCOUNTER — Encounter (HOSPITAL_BASED_OUTPATIENT_CLINIC_OR_DEPARTMENT_OTHER): Payer: Medicare Other | Admitting: Internal Medicine

## 2023-03-19 DIAGNOSIS — T8484XA Pain due to internal orthopedic prosthetic devices, implants and grafts, initial encounter: Secondary | ICD-10-CM | POA: Diagnosis not present

## 2023-03-19 DIAGNOSIS — L03116 Cellulitis of left lower limb: Secondary | ICD-10-CM | POA: Diagnosis not present

## 2023-03-20 ENCOUNTER — Ambulatory Visit: Payer: Medicare Other | Admitting: Physical Therapy

## 2023-03-20 ENCOUNTER — Other Ambulatory Visit (HOSPITAL_COMMUNITY): Payer: Self-pay | Admitting: Orthopedic Surgery

## 2023-03-22 ENCOUNTER — Encounter (HOSPITAL_BASED_OUTPATIENT_CLINIC_OR_DEPARTMENT_OTHER): Payer: Medicare Other | Admitting: Internal Medicine

## 2023-03-22 ENCOUNTER — Ambulatory Visit: Payer: Medicare Other | Admitting: Physical Therapy

## 2023-03-22 ENCOUNTER — Telehealth: Payer: Self-pay | Admitting: *Deleted

## 2023-03-22 ENCOUNTER — Encounter (HOSPITAL_BASED_OUTPATIENT_CLINIC_OR_DEPARTMENT_OTHER): Payer: Self-pay | Admitting: Orthopedic Surgery

## 2023-03-22 DIAGNOSIS — E11622 Type 2 diabetes mellitus with other skin ulcer: Secondary | ICD-10-CM | POA: Diagnosis not present

## 2023-03-22 DIAGNOSIS — L89524 Pressure ulcer of left ankle, stage 4: Secondary | ICD-10-CM | POA: Diagnosis not present

## 2023-03-22 DIAGNOSIS — E11621 Type 2 diabetes mellitus with foot ulcer: Secondary | ICD-10-CM | POA: Diagnosis not present

## 2023-03-22 DIAGNOSIS — L97822 Non-pressure chronic ulcer of other part of left lower leg with fat layer exposed: Secondary | ICD-10-CM | POA: Diagnosis not present

## 2023-03-22 DIAGNOSIS — I87312 Chronic venous hypertension (idiopathic) with ulcer of left lower extremity: Secondary | ICD-10-CM | POA: Diagnosis not present

## 2023-03-22 DIAGNOSIS — I4819 Other persistent atrial fibrillation: Secondary | ICD-10-CM | POA: Diagnosis not present

## 2023-03-22 DIAGNOSIS — I69354 Hemiplegia and hemiparesis following cerebral infarction affecting left non-dominant side: Secondary | ICD-10-CM | POA: Diagnosis not present

## 2023-03-22 NOTE — Telephone Encounter (Signed)
Dr. Bjorn Pippin, pt's chart reviewed for preoperative cardiac evaluation. She was recently seen by you on 01/25/23 and according to protocol we request that you comment on cardiac ris for upcoming procedure since office visit was less than 2 months ago.    Please route your response to p cv div preop.   Thank you, Marcelino Duster

## 2023-03-22 NOTE — Telephone Encounter (Signed)
   Pre-operative Risk Assessment    Patient Name: Karen Jackson  DOB: 12/28/1940 MRN: [id number]      Request for Surgical Clearance    Procedure:   LEFT ANKLE WOUND IRRIGATION AND DEBRIDEMENT; REMOVAL OF DEEP IMPLANTS LEFT   Date of Surgery:  Clearance 03/29/23                                 Surgeon:  DR. Jonny Ruiz HEWITT Surgeon's Group or Practice Name:  [facility] Phone number:  [phone] ATTN: MEGAN DAVIS Fax number:  [phone]   Type of Clearance Requested:   - Medical  - Pharmacy:  Hold Apixaban (Eliquis)     Type of Anesthesia:   CHOICE   Additional requests/questions:    [name]   03/22/2023, 3:39 PM

## 2023-03-22 NOTE — Progress Notes (Signed)
Karen Jackson ([id number]) [id number] Page 1 of 8 Visit Report for 03/22/2023 Chief Complaint Document Details Patient Name: Date of Service: Karen Jackson [place] F. 03/22/2023 2:00 PM Medical Record Number: [id number] Patient Account Number: [id number] Date of Birth/Sex: Treating RN: December 19, 1940 (82 y.o. F) Primary Care Provider: Ezequiel Kayser Other Clinician: Referring Provider: Treating Provider/Extender: Ermalinda Barrios in Treatment: 3 Information Obtained from: Patient Chief Complaint 02/23/2023; left lateral leg wound, left lateral ankle wound Electronic Signature(s) Signed: 03/22/2023 3:19:04 PM By: Geralyn Corwin DO Entered By: Geralyn Corwin on 03/22/2023 14:36:47 -------------------------------------------------------------------------------- HPI Details Patient Name: Date of Service: Karen Jackson [place] F. 03/22/2023 2:00 PM Medical Record Number: [id number] Patient Account Number: [id number] Date of Birth/Sex: Treating RN: [birth year] (82 y.o. 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 T the left leg she has been following her primary care physician office for this issue. She is not quite sure how it started. o She has had 2 rounds of doxycycline. Currently she is not doing any wound care dressing but keeping the area covered. The left lateral ankle wound has been an ongoing issue for the patient over the past  year. She follows with Dr. Victorino Dike who did a left ankle ORIF on 07/20/2018. She is had waxing and waning in wound healing to this area. She reports chronic pain to this site however denies any increased warmth, erythema or purulent drainage. 7/5; patient presents for follow-up. A wound culture done at last clinic visit grew Proteus mirabilis and Staphylococcus warneri. This is sensitive to ciprofloxacin. She has been using antibiotic ointment with Hydrofera Blue to the wound beds. She has not been scheduled for her CT scan of the left ankle. She denies systemic signs of infection. 7/16; patient presents for follow-up. She completed her course of ciprofloxacin. She has been using antibiotic ointment with Hydrofera Blue to the wound beds. She had her CT scan done last week however this has not been read. She again has thick yellow drainage and serosanguineous drainage from the left lateral leg. 7/25; patient presents for follow-up. She had a CT scan that showed an abscess at the superior aspect of the hardware in her left ankle/leg. She followed up with Dr. Victorino Dike and plan is for removal of the hardware with debridement. He will continue antibiotics. Patient has no issues or complaints. Electronic Signature(s) Signed: 03/22/2023 3:19:04 PM By: Geralyn Corwin DO Entered By: Geralyn Corwin on 03/22/2023 14:38:03 Karen Jackson ([id number]) [id number] Page 2 of 8 -------------------------------------------------------------------------------- Physical Exam Details Patient Name: Date of Service: Karen Jackson [place] F. 03/22/2023 2:00 PM Medical Record Number: [id number] Patient Account Number: [id number] Date of Birth/Sex: Treating RN: November 24, 1940 (82 y.o. F) Primary Care Provider: Ezequiel Kayser Other Clinician: Referring Provider: Treating Provider/Extender: Ermalinda Barrios in Treatment: 3 Constitutional respirations regular, non-labored and within  target range for patient.. Cardiovascular 2+ dorsalis pedis/posterior tibialis pulses. Psychiatric pleasant and cooperative. Notes Left lower extremity:  T the lateral aspect of the leg there is an open wound with Granulation tissue at the opening that is hyper granulated and on palpation o there is thick yellow drainage. Evidence of venous stasis dermatitis. T the left lateral ankle there is an open pinpoint area that probes About 0.2 cm. Previous o clinic visit probes straight to bone/metal plate. No signs of active soft tissue infection including increased warmth, erythema purulent drainage. Electronic Signature(s) Signed: 03/22/2023 3:19:04 PM By: Geralyn Corwin DO Entered By: Geralyn Corwin on 03/22/2023 14:43:27 -------------------------------------------------------------------------------- Physician Orders Details Patient Name: Date of Service: Karen [place] F. 03/22/2023 2:00 PM Medical Record Number: [id number] Patient Account Number: [id number] Date of Birth/Sex: Treating RN: 05-03-1941 (82 y.o. [name] Primary Care Provider: Ezequiel Kayser Other Clinician: Referring Provider: Treating Provider/Extender: Ermalinda Barrios in Treatment: 3 Verbal / Phone Orders: No Diagnosis Coding Follow-up Appointments Other: - Continue care for surgery with Dr Victorino Dike. Anesthetic (In clinic) Topical Lidocaine 4% applied to wound bed Bathing/ Shower/ Hygiene May shower and wash wound with soap and water. Edema Control - Lymphedema / SCD / Other Bilateral Lower Extremities Elevate legs to the level of the heart or above for 30 minutes daily and/or when sitting for 3-4 times a day throughout the day. Avoid standing for long periods of time. Off-Loading Other: - Wear Bunny Boot/Prevalon Boot when in bed/sitting for long periods of time. Try and limit wearing left leg brace. Use the Tubigrip for now on the left leg and do not wear compression hose for now  (02/23/23) Wound Treatment Wound #1 - Lower Leg Wound Laterality: Left, Lateral Cleanser: Vashe 5.8 (oz) (Generic) 1 x Per Day/30 Days Discharge Instructions: Cleanse the wound with Vashe prior to applying a clean dressing using gauze sponges, not tissue or cotton balls. Karen, Jackson ([id number]) [id number] Page 3 of 8 Topical: Mupirocin Ointment 1 x Per Day/30 Days Discharge Instructions: Apply Mupirocin (Bactroban) first then place Hydrafera blue Ready Prim Dressing: Hydrofera Blue Ready Transfer Foam, 2.5x2.5 (in/in) (Generic) 1 x Per Day/30 Days ary Discharge Instructions: Apply over Mupirocin then apply Hydrafera Blue Secondary Dressing: Bordered Gauze, 2x2 in (Generic) 1 x Per Day/30 Days Discharge Instructions: Apply over primary dressing Hydrafera Blue Secured With: Tubigrip E 1 x Per Day/30 Days Wound #2 - Ankle Wound Laterality: Left, Lateral Cleanser: Vashe 5.8 (oz) (Generic) 1 x Per Day/30 Days Discharge Instructions: Cleanse the wound with Vashe prior to applying a clean dressing using gauze sponges, not tissue or cotton balls. Topical: Mupirocin Ointment 1 x Per Day/30 Days Discharge Instructions: Apply Mupirocin (Bactroban) first then place Hydrafera blue Ready Prim Dressing: Hydrofera Blue Ready Transfer Foam, 2.5x2.5 (in/in) (Generic) 1 x Per Day/30 Days ary Discharge Instructions: Apply over Mupirocin then apply Hydrafera Blue Secondary Dressing: Bordered Gauze, 2x2 in (Generic) 1 x Per Day/30 Days Discharge Instructions: Apply over primary dressing Hydrafera Blue Secured With: Tubigrip E 1 x Per Day/30 Days Patient Medications llergies: No Known Drug Allergies A Notifications Medication Indication Start End 03/22/2023 lidocaine DOSE topical 4 % cream - cream topical once daily Electronic Signature(s) Signed: 03/22/2023 3:19:04 PM By: Geralyn Corwin DO Entered By: Geralyn Corwin on 03/22/2023  14:43:36 -------------------------------------------------------------------------------- Problem List Details Patient Name: Date of Service: Karen [place] F. 03/22/2023 2:00 PM Medical Record Number: [id number] Patient Account Number: [id number] Date of Birth/Sex: Treating RN: 17-Dec-1940 (82 y.o. F) Primary Care Provider: Ezequiel Kayser Other Clinician: Referring Provider: Treating Provider/Extender: Yisroel Ramming,  Mark A Weeks in Treatment: 3 Active Problems ICD-10 Encounter Code Description Active Date MDM Diagnosis [phone] Non-pressure chronic ulcer of other part of left lower leg with fat layer exposed 02/23/2023 No Yes I87.312 Chronic venous hypertension (idiopathic) with ulcer of left lower extremity 02/23/2023 No Yes L89.524 Pressure ulcer of left ankle, stage 4 02/23/2023 No Yes Karen Jackson, Karen Jackson ([id number]) [id number] Page 4 of 8 E11.621 Type 2 diabetes mellitus with foot ulcer 02/23/2023 No Yes I67.9 Cerebrovascular disease, unspecified 02/23/2023 No Yes I69.952 Hemiplegia and hemiparesis following unspecified cerebrovascular disease 02/23/2023 No Yes affecting left dominant side I48.19 Other persistent atrial fibrillation 02/23/2023 No Yes Z79.01 Long term (current) use of anticoagulants 02/23/2023 No Yes Inactive Problems Resolved Problems Electronic Signature(s) Signed: 03/22/2023 3:19:04 PM By: Geralyn Corwin DO Entered By: Geralyn Corwin on 03/22/2023 14:36:32 -------------------------------------------------------------------------------- Progress Note Details Patient Name: Date of Service: Karen [place] F. 03/22/2023 2:00 PM Medical Record Number: [id number] Patient Account Number: [id number] Date of Birth/Sex: Treating RN: 1940/09/08 (82 y.o. F) Primary Care Provider: Ezequiel Kayser Other Clinician: Referring Provider: Treating Provider/Extender: Ermalinda Barrios in Treatment: 3 Subjective Chief  Complaint Information obtained from Patient 02/23/2023; left lateral leg wound, left lateral ankle wound History of Present Illness (HPI) 02/23/2023 Ms. 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 T the left leg she has been following her primary care physician office for this issue. She is not quite sure how it started. o She has had 2 rounds of doxycycline. Currently she is not doing any wound care dressing but keeping the area covered. The left lateral ankle wound has been an ongoing issue for the patient over the past year. She follows with Dr. Victorino Dike who did a left ankle ORIF on 07/20/2018. She is had waxing and waning in wound healing to this area. She reports chronic pain to this site however denies any increased warmth, erythema or purulent drainage. 7/5; patient presents for follow-up. A wound culture done at last clinic visit grew Proteus mirabilis and Staphylococcus warneri. This is sensitive to ciprofloxacin. She has been using antibiotic ointment with Hydrofera Blue to the wound beds. She has not been scheduled for her CT scan of the left ankle. She denies systemic signs of infection. 7/16; patient presents for follow-up. She completed her course of ciprofloxacin. She has been using antibiotic ointment with Hydrofera Blue to the wound beds. She had her CT scan done last week however this has not been read. She again has thick yellow drainage and serosanguineous drainage from the left lateral leg. 7/25; patient presents for follow-up. She had a CT scan that showed an abscess at the superior aspect of the hardware in her left ankle/leg. She followed up with Dr. Victorino Dike and plan is  for removal of the hardware with debridement. He will continue antibiotics. Patient has no issues or complaints. Patient History Information obtained from Patient, Chart. Family History Karen Jackson, Karen Jackson ([id number]) [id number] Page 5 of 8 Cancer - Mother,Siblings. Social History Never smoker, Marital Status - Widowed, Alcohol Use - Never, Drug Use - No History, Caffeine Use - Never. Medical History Cardiovascular Patient has history of  Arrhythmia - Hx A. fib, Congestive Heart Failure Endocrine Denies history of Type II Diabetes Musculoskeletal Patient has history of Osteoarthritis Hospitalization/Surgery History - bilat knee replacement. - right ankle replacement. Medical A Surgical History Notes nd Constitutional Symptoms (General Health) Stroke Respiratory pulmonary HTN Gastrointestinal GERD DIVERTICULITIS Neurologic hemiparesis left side late affect of CVA Objective Constitutional respirations regular, non-labored and within target range for patient.. Vitals Time Taken: 2:00 PM, Height: 58 in, Weight: 160 lbs, BMI: 33.4, Temperature: 97.9 F, Pulse: 75 bpm, Respiratory Rate: 18 breaths/min, Blood Pressure: 140/82 mmHg. Cardiovascular 2+ dorsalis pedis/posterior tibialis pulses. Psychiatric pleasant and cooperative. General Notes: Left lower extremity: T the lateral aspect of the leg there is an open wound with Granulation tissue at the opening that is hyper granulated and o on palpation there is thick yellow drainage. Evidence of venous stasis dermatitis. T the left lateral ankle there is an open pinpoint area that probes About 0.2 o cm. Previous clinic visit probes straight to bone/metal plate. No signs of active soft tissue infection including increased warmth, erythema purulent drainage. Integumentary (Hair, Skin) Wound #1 status is Open. Original cause of wound was Gradually Appeared. The date acquired was: 12/27/2022. The wound has been in  treatment 3 weeks. The wound is located on the Left,Lateral Lower Leg. The wound measures 0.7cm length x 0.4cm width x 0.1cm depth; 0.22cm^2 area and 0.022cm^3 volume. There is Fat Layer (Subcutaneous Tissue) exposed. There is no tunneling or undermining noted. There is a medium amount of purulent drainage noted. The wound margin is distinct with the outline attached to the wound base. There is medium (34-66%) red, pink, hyper - granulation within the wound bed. There is a medium (34-66%) amount of necrotic tissue within the wound bed including Adherent Slough. The periwound skin appearance exhibited: Scarring. The periwound skin appearance did not exhibit: Callus, Crepitus, Excoriation, Induration, Rash, Dry/Scaly, Maceration, Atrophie Blanche, Cyanosis, Ecchymosis, Hemosiderin Staining, Mottled, Pallor, Rubor, Erythema. Wound #2 status is Open. Original cause of wound was Pressure Injury. The date acquired was: 02/22/2023. The wound has been in treatment 3 weeks. The wound is located on the Left,Lateral Ankle. The wound measures 0.3cm length x 0.3cm width x 0.2cm depth; 0.071cm^2 area and 0.014cm^3 volume. There is bone and Fat Layer (Subcutaneous Tissue) exposed. There is no tunneling or undermining noted. There is a medium amount of serosanguineous drainage noted. The wound margin is distinct with the outline attached to the wound base. There is small (1-33%) pink granulation within the wound bed. There is a large (67-100%) amount of necrotic tissue within the wound bed including Eschar. The periwound skin appearance had no abnormalities noted for texture. The periwound skin appearance had no abnormalities noted for moisture. The periwound skin appearance had no abnormalities noted for color. The periwound has tenderness on palpation. Assessment Active Problems ICD-10 Non-pressure chronic ulcer of other part of left lower leg with fat layer exposed Chronic venous hypertension (idiopathic) with  ulcer of left lower extremity Pressure ulcer of left ankle, stage 4 Type 2 diabetes mellitus with foot ulcer Cerebrovascular disease, unspecified Hemiplegia and hemiparesis following unspecified cerebrovascular disease affecting left dominant side Other persistent atrial fibrillation Long term (current) use of anticoagulants Karen, Jackson ([id number]) [id number] Page 6 of 8 Patient's wounds are now explained by likely infected hardware that will be removed by Dr. Victorino Dike on 03/29/2023. No signs of active acute soft tissue infection at this time. She is on ciprofloxacin. Can continue Hydrofera Blue and mupirocin ointment to the wound beds.  She will continue care with Dr. Victorino Dike. We are happy to see her as needed. Plan Follow-up Appointments: Other: - Continue care for surgery with Dr Victorino Dike. Anesthetic: (In clinic) Topical Lidocaine 4% applied to wound bed Bathing/ Shower/ Hygiene: May shower and wash wound with soap and water. Edema Control - Lymphedema / SCD / Other: Elevate legs to the level of the heart or above for 30 minutes daily and/or when sitting for 3-4 times a day throughout the day. Avoid standing for long periods of time. Off-Loading: Other: - Wear Bunny Boot/Prevalon Boot when in bed/sitting for long periods of time. Try and limit wearing left leg brace. Use the Tubigrip for now on the left leg and do not wear compression hose for now (02/23/23) The following medication(s) was prescribed: lidocaine topical 4 % cream cream topical once daily was prescribed at facility WOUND #1: - Lower Leg Wound Laterality: Left, Lateral Cleanser: Vashe 5.8 (oz) (Generic) 1 x Per Day/30 Days Discharge Instructions: Cleanse the wound with Vashe prior to applying a clean dressing using gauze sponges, not tissue or cotton balls. Topical: Mupirocin Ointment 1 x Per Day/30 Days Discharge Instructions: Apply Mupirocin (Bactroban) first then place Hydrafera blue Ready Prim  Dressing: Hydrofera Blue Ready Transfer Foam, 2.5x2.5 (in/in) (Generic) 1 x Per Day/30 Days ary Discharge Instructions: Apply over Mupirocin then apply Hydrafera Blue Secondary Dressing: Bordered Gauze, 2x2 in (Generic) 1 x Per Day/30 Days Discharge Instructions: Apply over primary dressing Hydrafera Blue Secured With: Tubigrip E 1 x Per Day/30 Days WOUND #2: - Ankle Wound Laterality: Left, Lateral Cleanser: Vashe 5.8 (oz) (Generic) 1 x Per Day/30 Days Discharge Instructions: Cleanse the wound with Vashe prior to applying a clean dressing using gauze sponges, not tissue or cotton balls. Topical: Mupirocin Ointment 1 x Per Day/30 Days Discharge Instructions: Apply Mupirocin (Bactroban) first then place Hydrafera blue Ready Prim Dressing: Hydrofera Blue Ready Transfer Foam, 2.5x2.5 (in/in) (Generic) 1 x Per Day/30 Days ary Discharge Instructions: Apply over Mupirocin then apply Hydrafera Blue Secondary Dressing: Bordered Gauze, 2x2 in (Generic) 1 x Per Day/30 Days Discharge Instructions: Apply over primary dressing Hydrafera Blue Secured With: Tubigrip E 1 x Per Day/30 Days 1. Continue care with Dr. Victorino Dike 2. Follow-up as needed 3. Continue Hydrofera Blue mupirocin ointment Electronic Signature(s) Signed: 03/22/2023 3:19:04 PM By: Geralyn Corwin DO Entered By: Geralyn Corwin on 03/22/2023 14:47:17 -------------------------------------------------------------------------------- HxROS Details Patient Name: Date of Service: Karen Jackson [place] F. 03/22/2023 2:00 PM Medical Record Number: [id number] Patient Account Number: [id number] Date of Birth/Sex: Treating RN: August 30, 1940 (82 y.o. F) Primary Care Provider: Ezequiel Kayser Other Clinician: Referring Provider: Treating Provider/Extender: Ermalinda Barrios in Treatment: 3 Information Obtained From Patient Chart Constitutional Symptoms (General Health) Medical History: Past Medical History Notes: Stroke Karen, Jackson  ([id number]) [id number] Page 7 of 8 Respiratory Medical History: Past Medical History Notes: pulmonary HTN Cardiovascular Medical History: Positive for: Arrhythmia - Hx A. fib; Congestive Heart Failure Gastrointestinal Medical History: Past Medical History Notes: GERD DIVERTICULITIS Endocrine Medical History: Negative for: Type II Diabetes Musculoskeletal Medical History: Positive for: Osteoarthritis Neurologic Medical History: Past Medical History Notes: hemiparesis left side late affect of CVA Immunizations Pneumococcal Vaccine: Received Pneumococcal Vaccination: Yes Received Pneumococcal Vaccination On or After 60th Birthday: Yes Implantable Devices No devices added Hospitalization / Surgery History Type of Hospitalization/Surgery bilat knee replacement right ankle replacement Family and Social History Cancer: Yes - Mother,Siblings; Never smoker; Marital Status - Widowed; Alcohol Use: Never; Drug Use: No History;  Caffeine Use: Never; Financial Concerns: No; Food, Clothing or Shelter Needs: No; Support System Lacking: No; Transportation Concerns: No Electronic Signature(s) Signed: 03/22/2023 3:19:04 PM By: Geralyn Corwin DO Entered By: Geralyn Corwin on 03/22/2023 14:39:05 -------------------------------------------------------------------------------- SuperBill Details Patient Name: Date of Service: Karen Jackson [place] F. 03/22/2023 Medical Record Number: [id number] Patient Account Number: [id number] Date of Birth/Sex: Treating RN: September 10, 1940 (82 y.o. [name] Primary Care Provider: Ezequiel Kayser Other Clinician: Referring Provider: Treating Provider/Extender: Ermalinda Barrios in Treatment: 3 Diagnosis Coding Karen, Jackson ([id number]) [id number] Page 8 of 8 ICD-10 Codes Code Description [phone] Non-pressure chronic ulcer of other part of left lower leg with fat layer  exposed I87.312 Chronic venous hypertension (idiopathic) with ulcer of left lower extremity L89.524 Pressure ulcer of left ankle, stage 4 E11.621 Type 2 diabetes mellitus with foot ulcer I67.9 Cerebrovascular disease, unspecified I69.952 Hemiplegia and hemiparesis following unspecified cerebrovascular disease affecting left dominant side I48.19 Other persistent atrial fibrillation Z79.01 Long term (current) use of anticoagulants Facility Procedures : CPT4 Code: [id number] Description: 99213 - WOUND CARE VISIT-LEV 3 EST PT Modifier: Quantity: 1 Physician Procedures : CPT4 Code Description Modifier [id number] 99213 - WC PHYS LEVEL 3 - EST PT ICD-10 Diagnosis Description L97.822 Non-pressure chronic ulcer of other part of left lower leg with fat layer exposed I87.312 Chronic venous hypertension (idiopathic) with ulcer  of left lower extremity L89.524 Pressure ulcer of left ankle, stage 4 E11.621 Type 2 diabetes mellitus with foot ulcer Quantity: 1 Electronic Signature(s) Signed: 03/22/2023 3:19:04 PM By: Geralyn Corwin DO Entered By: Geralyn Corwin on 03/22/2023 14:47:37

## 2023-03-22 NOTE — Progress Notes (Signed)
CAMBELL, RICKENBACH ([id number]) [id number] Page 1 of 9 Visit Report for 03/22/2023 Arrival Information Details Patient Name: Date of Service: Karen Argyle [place] F. 03/22/2023 2:00 PM Medical Record Number: [id number] Patient Account Number: [id number] Date of Birth/Sex: Treating RN: [birth year] (82 y.o. Karen Jackson Primary Care : Ezequiel Kayser Other Clinician: Referring : Treating /Extender: Ermalinda Barrios in Treatment: 3 Visit Information History Since Last Visit Added or deleted any medications: No Patient Arrived: Wheel Chair Any new allergies or adverse reactions: No Arrival Time: 14:00 Had a fall or experienced change in No Accompanied By: daughter activities of daily living that may affect Transfer Assistance: None risk of falls: Patient Identification Verified: Yes Signs or symptoms of abuse/neglect since last No Secondary Verification Process Completed: Yes visito Patient Requires Transmission-Based Precautions: No Hospitalized since last visit: No Patient Has Alerts: Yes Implantable device outside of the clinic No Patient Alerts: Patient on Blood Thinner excluding Eliquis cellular tissue based products placed in the center since last visit: Has Dressing in Place as Prescribed: Yes Has Compression in Place as Prescribed: Yes Has Footwear/Offloading in Place as Yes Prescribed: Left: Removable Cast Walker/Walking Boot Pain Present Now: Yes Electronic Signature(s) Signed: 03/22/2023 4:00:15 PM By: Redmond Pulling RN, BSN Entered By: Redmond Pulling on 03/22/2023 14:01:46 -------------------------------------------------------------------------------- Clinic Level of Care Assessment Details Patient Name: Date of Service: Karen Argyle [place] F. 03/22/2023 2:00 PM Medical Record Number: [id number] Patient Account Number: [id number] Date of Birth/Sex: Treating RN: Jan 11, 1941 (82 y.o. Karen Jackson Primary Care  : Ezequiel Kayser Other Clinician: Referring : Treating /Extender: Ermalinda Barrios in Treatment: 3 Clinic Level of Care Assessment Items TOOL 4 Quantity Score X- 1 0 Use when only an EandM is performed on FOLLOW-UP visit ASSESSMENTS - Nursing Assessment / Reassessment X- 1 10 Reassessment of Co-morbidities (includes updates in patient status) X- 1 5 Reassessment of Adherence to Treatment Plan ASSESSMENTS - Wound and Skin A ssessment / Reassessment X - Simple Wound Assessment / Reassessment - one wound 1 5 []  - 0 Complex Wound Assessment / Reassessment - multiple wounds []  - 0 Dermatologic / Skin Assessment (not related to wound area) Karen Jackson, Karen Jackson ([id number]) [id number] Page 2 of 9 ASSESSMENTS - Focused Assessment X- 1 5 Circumferential Edema Measurements - multi extremities []  - 0 Nutritional Assessment / Counseling / Intervention []  - 0 Lower Extremity Assessment (monofilament, tuning fork, pulses) []  - 0 Peripheral Arterial Disease Assessment (using hand held doppler) ASSESSMENTS - Ostomy and/or Continence Assessment and Care []  - 0 Incontinence Assessment and Management []  - 0 Ostomy Care Assessment and Management (repouching, etc.) PROCESS - Coordination of Care []  - 0 Simple Patient / Family Education for ongoing care []  - 0 Complex (extensive) Patient / Family Education for ongoing care X- 1 10 Staff obtains Chiropractor, Records, T Results / Process Orders est []  - 0 Staff telephones HHA, Nursing Homes / Clarify orders / etc []  - 0 Routine Transfer to another Facility (non-emergent condition) []  - 0 Routine Hospital Admission (non-emergent condition) []  - 0 New Admissions / Manufacturing engineer / Ordering NPWT Apligraf, etc. , []  - 0 Emergency Hospital Admission (emergent condition) X- 1 10 Simple Discharge Coordination []  - 0 Complex (extensive) Discharge Coordination PROCESS -  Special Needs []  - 0 Pediatric / Minor Patient Management []  - 0 Isolation Patient Management []  - 0 Hearing / Language / Visual special needs []  - 0 Assessment of Community assistance (transportation,  D/C planning, etc.) []  - 0 Additional assistance / Altered mentation []  - 0 Support Surface(s) Assessment (bed, cushion, seat, etc.) INTERVENTIONS - Wound Cleansing / Measurement []  - 0 Simple Wound Cleansing - one wound X- 2 5 Complex Wound Cleansing - multiple wounds X- 1 5 Wound Imaging (photographs - any number of wounds) []  - 0 Wound Tracing (instead of photographs) []  - 0 Simple Wound Measurement - one wound X- 2 5 Complex Wound Measurement - multiple wounds INTERVENTIONS - Wound Dressings X - Small Wound Dressing one or multiple wounds 2 10 []  - 0 Medium Wound Dressing one or multiple wounds []  - 0 Large Wound Dressing one or multiple wounds X- 1 5 Application of Medications - topical []  - 0 Application of Medications - injection INTERVENTIONS - Miscellaneous []  - 0 External ear exam []  - 0 Specimen Collection (cultures, biopsies, blood, body fluids, etc.) []  - 0 Specimen(s) / Culture(s) sent or taken to Lab for analysis []  - 0 Patient Transfer (multiple staff / Teddy Spike / Similar devices) Karen Jackson ([id number]) [id number] Page 3 of 9 []  - 0 Simple Staple / Suture removal (25 or less) []  - 0 Complex Staple / Suture removal (26 or more) []  - 0 Hypo / Hyperglycemic Management (close monitor of Blood Glucose) []  - 0 Ankle / Brachial Index (ABI) - do not check if billed separately X- 1 5 Vital Signs Has the patient been seen at the hospital within the last three years: Yes Total Score: 100 Level Of Care: New/Established - Level 3 Electronic Signature(s) Signed: 03/22/2023 4:00:15 PM By: Redmond Pulling RN, BSN Entered By: Redmond Pulling on 03/22/2023  14:42:23 -------------------------------------------------------------------------------- Encounter Discharge Information Details Patient Name: Date of Service: Karen Argyle [place] F. 03/22/2023 2:00 PM Medical Record Number: [id number] Patient Account Number: [id number] Date of Birth/Sex: Treating RN: Jun 21, 1941 (82 y.o. Karen Jackson Primary Care : Ezequiel Kayser Other Clinician: Referring : Treating /Extender: Ermalinda Barrios in Treatment: 3 Encounter Discharge Information Items Discharge Condition: Stable Ambulatory Status: Wheelchair Discharge Destination: Home Transportation: Private Auto Accompanied By: friend Schedule Follow-up Appointment: Yes Clinical Summary of Care: Patient Declined Electronic Signature(s) Signed: 03/22/2023 4:00:15 PM By: Redmond Pulling RN, BSN Entered By: Redmond Pulling on 03/22/2023 14:43:54 -------------------------------------------------------------------------------- Lower Extremity Assessment Details Patient Name: Date of Service: Karen Argyle [place] F. 03/22/2023 2:00 PM Medical Record Number: [id number] Patient Account Number: [id number] Date of Birth/Sex: Treating RN: 1941-03-31 (82 y.o. Karen Jackson Primary Care : Ezequiel Kayser Other Clinician: Referring : Treating /Extender: Levell July Weeks in Treatment: 3 Edema Assessment Assessed: [Left: No] [Right: No] [Left: Edema] [Right: :] Calf Left: Right: Point of Measurement: From Medial Instep 34 cm Ankle Karen Jackson, Karen Jackson ([id number]) [id number] Page 4 of 9 Left: Right: Point of Measurement: From Medial Instep 20.5 cm Vascular Assessment Pulses: Dorsalis Pedis Palpable: [Left:Yes] Extremity colors, hair growth, and conditions: Extremity Color: [Left:Normal] Hair Growth on Extremity: [Left:No] Temperature of Extremity: [Left:Warm] Capillary Refill: [Left:< 3  seconds] Dependent Rubor: [Left:No No] Electronic Signature(s) Signed: 03/22/2023 4:00:15 PM By: Redmond Pulling RN, BSN Entered By: Redmond Pulling on 03/22/2023 14:05:38 -------------------------------------------------------------------------------- Multi Wound Chart Details Patient Name: Date of Service: Karen Arg[place] F. 03/22/2023 2:00 PM Medical Record Number: [id number] Patient Account Number: [id number] Date of Birth/Sex: Treating RN: 21-Jun-1941 (82 y.o. F) Primary Care : Ezequiel Kayser Other Clinician: Referring : Treating /Extender: Ermalinda Barrios in Treatment: 3 Vital  Signs Height(in): 58 Pulse(bpm): 75 Weight(lbs): 160 Blood Pressure(mmHg): 140/82 Body Mass Index(BMI): 33.4 Temperature(F): 97.9 Respiratory Rate(breaths/min): 18 [1:Photos:] [N/A:N/A] Left, Lateral Lower Leg Left, Lateral Ankle N/A Wound Location: Gradually Appeared Pressure Injury N/A Wounding Event: Venous Leg Ulcer Pressure Ulcer N/A Primary Etiology: Abscess N/A N/A Secondary Etiology: Arrhythmia, Congestive Heart Failure, Arrhythmia, Congestive Heart Failure, N/A Comorbid History: Osteoarthritis Osteoarthritis 12/27/2022 02/22/2023 N/A Date Acquired: 3 3 N/A Weeks of Treatment: Open Open N/A Wound Status: No No N/A Wound Recurrence: 0.7x0.4x0.1 0.3x0.3x0.2 N/A Measurements L x W x D (cm) 0.22 0.071 N/A A (cm) : rea 0.022 0.014 N/A Volume (cm) : 55.60% 71.70% N/A % Reduction in Area: 95.10% 94.40% N/A % Reduction in Volume: Full Thickness Without Exposed Category/Stage IV N/A Classification: Support Structures Medium Medium N/A Exudate Amount: Purulent Serosanguineous N/A Exudate Type: yellow, brown, green red, brown N/A Exudate Color: Distinct, outline attached Distinct, outline attached N/A Wound MarginKIEANA, Karen Jackson ([id number]) [id number] Page 5 of 9 Medium (34-66%) Small (1-33%) N/A Granulation  Amount: Red, Pink, Hyper-granulation Pink N/A Granulation Quality: Medium (34-66%) Large (67-100%) N/A Necrotic Amount: Adherent Slough Eschar N/A Necrotic Tissue: Fat Layer (Subcutaneous Tissue): Yes Fat Layer (Subcutaneous Tissue): Yes N/A Exposed Structures: Fascia: No Bone: Yes Tendon: No Muscle: No Joint: No Bone: No Small (1-33%) Small (1-33%) N/A Epithelialization: Scarring: Yes No Abnormalities Noted N/A Periwound Skin Texture: Excoriation: No Induration: No Callus: No Crepitus: No Rash: No Maceration: No No Abnormalities Noted N/A Periwound Skin Moisture: Dry/Scaly: No Atrophie Blanche: No No Abnormalities Noted N/A Periwound Skin Color: Cyanosis: No Ecchymosis: No Erythema: No Hemosiderin Staining: No Mottled: No Pallor: No Rubor: No N/A Yes N/A Tenderness on Palpation: Treatment Notes Electronic Signature(s) Signed: 03/22/2023 3:19:04 PM By: Geralyn Corwin DO Entered By: Geralyn Corwin on 03/22/2023 14:36:37 -------------------------------------------------------------------------------- Multi-Disciplinary Care Plan Details Patient Name: Date of Service: Karen Arg[place] F. 03/22/2023 2:00 PM Medical Record Number: [id number] Patient Account Number: [id number] Date of Birth/Sex: Treating RN: [birth year] (82 y.o. Karen Jackson Primary Care : Ezequiel Kayser Other Clinician: Referring : Treating /Extender: Ermalinda Barrios in Treatment: 3 Active Inactive Electronic Signature(s) Signed: 03/22/2023 4:00:15 PM By: Redmond Pulling RN, BSN Entered By: Redmond Pulling on 03/22/2023 14:43:15 -------------------------------------------------------------------------------- Pain Assessment Details Patient Name: Date of Service: Karen Argyle [place] F. 03/22/2023 2:00 PM Medical Record Number: [id number] Patient Account Number: [id number] Date of Birth/Sex: Treating RN: 06-Mar-1941 (82 y.o. Karen Jackson Primary Care  : Ezequiel Kayser Other Clinician: Referring : Treating /Extender: Ermalinda Barrios in Treatment: 3 Karen Jackson, Karen F ([id number]) [id number] Page 6 of 9 Active Problems Location of Pain Severity and Description of Pain Patient Has Paino Yes Site Locations Rate the pain. Current Pain Level: 7 Pain Management and Medication Current Pain Management: Electronic Signature(s) Signed: 03/22/2023 4:00:15 PM By: Redmond Pulling RN, BSN Entered By: Redmond Pulling on 03/22/2023 14:02:56 -------------------------------------------------------------------------------- Patient/Caregiver Education Details Patient Name: Date of Service: Karen Argyle [place] F. 7/25/2024andnbsp2:00 PM Medical Record Number: [id number] Patient Account Number: [id number] Date of Birth/Gender: Treating RN: [birth year] (82 y.o. Karen Jackson Primary Care Physician: Ezequiel Kayser Other Clinician: Referring Physician: Treating Physician/Extender: Ermalinda Barrios in Treatment: 3 Education Assessment Education Provided To: Patient Education Topics Provided Wound/Skin Impairment: Methods: Explain/Verbal Responses: State content correctly Electronic Signature(s) Signed: 03/22/2023 4:00:15 PM By: Redmond Pulling RN, BSN Entered By: Redmond Pulling on 03/22/2023 14:12:06 Karen Jackson ([id number]) [id number] Page 7 of 9 --------------------------------------------------------------------------------  Wound Assessment Details Patient Name: Date of Service: Karen Argyle [place] F. 03/22/2023 2:00 PM Medical Record Number: [id number] Patient Account Number: [id number] Date of Birth/Sex: Treating RN: [birth year] (82 y.o. Karen Jackson Primary Care : Ezequiel Kayser Other Clinician: Referring : Treating /Extender: Levell July Weeks in Treatment: 3 Wound Status Wound Number:  1 Primary Etiology: Venous Leg Ulcer Wound Location: Left, Lateral Lower Leg Secondary Etiology: Abscess Wounding Event: Gradually Appeared Wound Status: Open Date Acquired: 12/27/2022 Comorbid History: Arrhythmia, Congestive Heart Failure, Osteoarthritis Weeks Of Treatment: 3 Clustered Wound: No Photos Wound Measurements Length: (cm) 0.7 Width: (cm) 0.4 Depth: (cm) 0.1 Area: (cm) 0.22 Volume: (cm) 0.022 % Reduction in Area: 55.6% % Reduction in Volume: 95.1% Epithelialization: Small (1-33%) Tunneling: No Undermining: No Wound Description Classification: Full Thickness Without Exposed Suppor Wound Margin: Distinct, outline attached Exudate Amount: Medium Exudate Type: Purulent Exudate Color: yellow, brown, green t Structures Foul Odor After Cleansing: No Slough/Fibrino Yes Wound Bed Granulation Amount: Medium (34-66%) Exposed Structure Granulation Quality: Red, Pink, Hyper-granulation Fascia Exposed: No Necrotic Amount: Medium (34-66%) Fat Layer (Subcutaneous Tissue) Exposed: Yes Necrotic Quality: Adherent Slough Tendon Exposed: No Muscle Exposed: No Joint Exposed: No Bone Exposed: No Periwound Skin Texture Texture Color No Abnormalities Noted: No No Abnormalities Noted: No Callus: No Atrophie Blanche: No Crepitus: No Cyanosis: No Excoriation: No Ecchymosis: No Induration: No Erythema: No Rash: No Hemosiderin Staining: No Scarring: Yes Mottled: No Pallor: No Moisture Rubor: No No Abnormalities Noted: No Dry / Scaly: No Maceration: No Electronic Signature(s) Signed: 03/22/2023 4:00:15 PM By: Redmond Pulling RN, BSN Signed: 03/22/2023 4:36:58 PM By: Thayer Dallas Entered By: Thayer Dallas on 03/22/2023 14:10:01 Karen Jackson ([id number]) [id number] Page 8 of 9 -------------------------------------------------------------------------------- Wound Assessment Details Patient Name: Date of Service: Karen Argyle [place] F. 03/22/2023 2:00  PM Medical Record Number: [id number] Patient Account Number: [id number] Date of Birth/Sex: Treating RN: 19-Jun-1941 (82 y.o. Karen Jackson Primary Care : Ezequiel Kayser Other Clinician: Referring : Treating /Extender: Levell July Weeks in Treatment: 3 Wound Status Wound Number: 2 Primary Etiology: Pressure Ulcer Wound Location: Left, Lateral Ankle Wound Status: Open Wounding Event: Pressure Injury Comorbid History: Arrhythmia, Congestive Heart Failure, Osteoarthritis Date Acquired: 02/22/2023 Weeks Of Treatment: 3 Clustered Wound: No Wound under treatment by  outside of Wound Center Photos Wound Measurements Length: (cm) 0.3 Width: (cm) 0.3 Depth: (cm) 0.2 Area: (cm) 0.071 Volume: (cm) 0.014 % Reduction in Area: 71.7% % Reduction in Volume: 94.4% Epithelialization: Small (1-33%) Tunneling: No Undermining: No Wound Description Classification: Category/Stage IV Wound Margin: Distinct, outline attached Exudate Amount: Medium Exudate Type: Serosanguineous Exudate Color: red, brown Foul Odor After Cleansing: No Slough/Fibrino Yes Wound Bed Granulation Amount: Small (1-33%) Exposed Structure Granulation Quality: Pink Fat Layer (Subcutaneous Tissue) Exposed: Yes Necrotic Amount: Large (67-100%) Bone Exposed: Yes Necrotic Quality: Eschar Periwound Skin Texture Texture Color No Abnormalities Noted: Yes No Abnormalities Noted: Yes Moisture Temperature / Pain No Abnormalities Noted: Yes Tenderness on Palpation: Yes Electronic Signature(s) Signed: 03/22/2023 4:00:15 PM By: Redmond Pulling RN, BSN Signed: 03/22/2023 4:36:58 PM By: Thayer Dallas Entered By: Thayer Dallas on 03/22/2023 14:10:36 Karen Jackson ([id number]) [id number] Page 9 of 9 -------------------------------------------------------------------------------- Vitals Details Patient Name: Date of Service: Karen Argyle [place] F. 03/22/2023  2:00 PM Medical Record Number: [id number] Patient Account Number: [id number] Date of Birth/Sex: Treating RN: 1940/12/18 (82 y.o. Karen Jackson Primary Care : Ezequiel Kayser Other Clinician: Referring : Treating /Extender: Mikey Bussing,  Edsel Petrin, Mark A Weeks in Treatment: 3 Vital Signs Time Taken: 14:00 Temperature (F): 97.9 Height (in): 58 Pulse (bpm): 75 Weight (lbs): 160 Respiratory Rate (breaths/min): 18 Body Mass Index (BMI): 33.4 Blood Pressure (mmHg): 140/82 Reference Range: 80 - 120 mg / dl Electronic Signature(s) Signed: 03/22/2023 4:36:58 PM By: Thayer Dallas Entered By: Thayer Dallas on 03/22/2023 14:08:07

## 2023-03-22 NOTE — Telephone Encounter (Signed)
Patient with diagnosis of afib on Eliquis for anticoagulation.    Procedure:  LEFT ANKLE WOUND IRRIGATION AND DEBRIDEMENT; REMOVAL OF DEEP IMPLANTS LEFT  Date of procedure: 03/29/23  Pt with hx of hemorrhagic stroke  CHA2DS2-VASc Score = 6   This indicates a 9.7% annual risk of stroke. The patient's score is based upon: CHF History: 1 HTN History: 1 Diabetes History: 0 Stroke History: 0 Vascular Disease History: 1 Age Score: 2 Gender Score: 1      CrCl 66 ml/min using scr of .55 and 18ml/min if rounding up to 0.8  Per office protocol, patient can hold Eliquis for 2 days prior to procedure.    **This guidance is not considered finalized until pre-operative APP has relayed final recommendations.**

## 2023-03-23 NOTE — Telephone Encounter (Signed)
   Patient Name: Karen Jackson  DOB: 06/27/1941 MRN: [id number]  Primary Cardiologist: Little Ishikawa, MD  Chart reviewed as part of pre-operative protocol coverage. Given past medical history and time since last visit, based on ACC/AHA guidelines, Karen Jackson is at acceptable risk for the planned procedure without further cardiovascular testing.   The patient was advised that if she develops new symptoms prior to surgery to contact our office to arrange for a follow-up visit, and she verbalized understanding.  Per Pharmacy: Per office protocol, patient can hold Eliquis for 2 days prior to procedure.    Per office protocol, if patient is without any new symptoms or concerns at the time of their virtual visit, he/she may hold Eliquis for 2 days prior to procedure. Please resume Eliquis as soon as possible postprocedure, at the discretion of the surgeon.    Per Dr. Bjorn Pippin: No further cardiac workup recommended prior to procedure  I will route this recommendation to the requesting party via Epic fax function and remove from pre-op pool.  Please call with questions.  Joni Reining, NP 03/23/2023, 7:30 AM

## 2023-03-23 NOTE — Telephone Encounter (Signed)
No further cardiac workup recommended prior to procedure

## 2023-03-26 NOTE — Progress Notes (Signed)
RN reviewed pt's chart with Dr. Maurilio Lovely.  Dr. Isaias Cowman reviewed pt's medical history, cardiac clearance received, and that pt is wheelchair bound, and unable to walk.  Dr. Isaias Cowman approved for pt to proceed with surgery here at [facility] on 03/29/2023, as long as her family member (support person) can assist pt with moving on DOS.   RN called pt to tell her to hold her Eliquis for 2 days prior to surgery per Dr. Bjorn Pippin, and pt verbalized understanding. Pt also assured RN that her niece would be coming with her the DOS, and she could help the pt in and out of the car, onto a wheelchair, chair, or stretcher.  Pt also assured RN that her niece could get her back home safety, and into her house safety after surgery.

## 2023-03-27 ENCOUNTER — Ambulatory Visit: Payer: Medicare Other | Admitting: Physical Therapy

## 2023-03-27 DIAGNOSIS — H6123 Impacted cerumen, bilateral: Secondary | ICD-10-CM | POA: Diagnosis not present

## 2023-03-29 ENCOUNTER — Encounter (HOSPITAL_BASED_OUTPATIENT_CLINIC_OR_DEPARTMENT_OTHER)
Admission: RE | Disposition: A | Discharge status: Home / Self Care | Payer: Self-pay | Source: Home / Self Care | Attending: Orthopedic Surgery

## 2023-03-29 ENCOUNTER — Ambulatory Visit (HOSPITAL_BASED_OUTPATIENT_CLINIC_OR_DEPARTMENT_OTHER): Payer: Medicare Other | Admitting: Internal Medicine

## 2023-03-29 ENCOUNTER — Ambulatory Visit (HOSPITAL_BASED_OUTPATIENT_CLINIC_OR_DEPARTMENT_OTHER): Payer: Medicare Other

## 2023-03-29 ENCOUNTER — Other Ambulatory Visit: Payer: Self-pay

## 2023-03-29 ENCOUNTER — Ambulatory Visit (HOSPITAL_BASED_OUTPATIENT_CLINIC_OR_DEPARTMENT_OTHER): Payer: Medicare Other | Admitting: Anesthesiology

## 2023-03-29 ENCOUNTER — Encounter (HOSPITAL_BASED_OUTPATIENT_CLINIC_OR_DEPARTMENT_OTHER): Payer: Self-pay | Admitting: Orthopedic Surgery

## 2023-03-29 ENCOUNTER — Ambulatory Visit (HOSPITAL_BASED_OUTPATIENT_CLINIC_OR_DEPARTMENT_OTHER)
Admission: RE | Admit: 2023-03-29 | Discharge: 2023-03-29 | Disposition: A | Discharge status: Home / Self Care | Payer: Medicare Other | Attending: Orthopedic Surgery | Admitting: Orthopedic Surgery

## 2023-03-29 DIAGNOSIS — I110 Hypertensive heart disease with heart failure: Secondary | ICD-10-CM | POA: Diagnosis not present

## 2023-03-29 DIAGNOSIS — T8484XA Pain due to internal orthopedic prosthetic devices, implants and grafts, initial encounter: Secondary | ICD-10-CM | POA: Diagnosis not present

## 2023-03-29 DIAGNOSIS — Z7901 Long term (current) use of anticoagulants: Secondary | ICD-10-CM | POA: Insufficient documentation

## 2023-03-29 DIAGNOSIS — I5030 Unspecified diastolic (congestive) heart failure: Secondary | ICD-10-CM

## 2023-03-29 DIAGNOSIS — E119 Type 2 diabetes mellitus without complications: Secondary | ICD-10-CM | POA: Diagnosis not present

## 2023-03-29 DIAGNOSIS — Z87891 Personal history of nicotine dependence: Secondary | ICD-10-CM | POA: Insufficient documentation

## 2023-03-29 DIAGNOSIS — T847XXA Infection and inflammatory reaction due to other internal orthopedic prosthetic devices, implants and grafts, initial encounter: Secondary | ICD-10-CM

## 2023-03-29 DIAGNOSIS — I5032 Chronic diastolic (congestive) heart failure: Secondary | ICD-10-CM | POA: Insufficient documentation

## 2023-03-29 DIAGNOSIS — I4819 Other persistent atrial fibrillation: Secondary | ICD-10-CM | POA: Diagnosis not present

## 2023-03-29 DIAGNOSIS — L03115 Cellulitis of right lower limb: Secondary | ICD-10-CM | POA: Diagnosis not present

## 2023-03-29 DIAGNOSIS — Y831 Surgical operation with implant of artificial internal device as the cause of abnormal reaction of the patient, or of later complication, without mention of misadventure at the time of the procedure: Secondary | ICD-10-CM | POA: Insufficient documentation

## 2023-03-29 HISTORY — PX: INCISION AND DRAINAGE OF WOUND: SHX1803

## 2023-03-29 HISTORY — PX: HARDWARE REMOVAL: SHX979

## 2023-03-29 LAB — AEROBIC/ANAEROBIC CULTURE W GRAM STAIN (SURGICAL/DEEP WOUND)

## 2023-03-29 LAB — GLUCOSE, CAPILLARY
Glucose-Capillary: 112 mg/dL — ABNORMAL HIGH (ref 70–99)
Glucose-Capillary: 133 mg/dL — ABNORMAL HIGH (ref 70–99)

## 2023-03-29 SURGERY — IRRIGATION AND DEBRIDEMENT WOUND
Anesthesia: General | Site: Ankle | Laterality: Left

## 2023-03-29 MED ORDER — SODIUM CHLORIDE 0.9 % IR SOLN
Status: DC | PRN
Start: 2023-03-29 — End: 2023-03-29
  Administered 2023-03-29: 3000 mL

## 2023-03-29 MED ORDER — SENNA 8.6 MG PO TABS: 2.0000 | ORAL_TABLET | Freq: Two times a day (BID) | ORAL | 0 refills | Status: AC

## 2023-03-29 MED ORDER — FENTANYL CITRATE (PF) 100 MCG/2ML IJ SOLN
INTRAMUSCULAR | Status: DC | PRN
  Administered 2023-03-29 (×2): 25 ug via INTRAVENOUS

## 2023-03-29 MED ORDER — DOCUSATE SODIUM 100 MG PO CAPS: 100.0000 mg | ORAL_CAPSULE | Freq: Two times a day (BID) | ORAL | 0 refills | Status: AC

## 2023-03-29 MED ORDER — PROPOFOL 500 MG/50ML IV EMUL
INTRAVENOUS | Status: DC | PRN
  Administered 2023-03-29: 25 ug/kg/min via INTRAVENOUS

## 2023-03-29 MED ORDER — TRANEXAMIC ACID-NACL 1000-0.7 MG/100ML-% IV SOLN
INTRAVENOUS | Status: DC | PRN
  Administered 2023-03-29: 1000 mg via INTRAVENOUS

## 2023-03-29 MED ORDER — LIDOCAINE HCL (CARDIAC) PF 100 MG/5ML IV SOSY
PREFILLED_SYRINGE | INTRAVENOUS | Status: DC | PRN
  Administered 2023-03-29: 60 mg via INTRAVENOUS

## 2023-03-29 MED ORDER — PHENYLEPHRINE 80 MCG/ML (10ML) SYRINGE FOR IV PUSH (FOR BLOOD PRESSURE SUPPORT)
PREFILLED_SYRINGE | INTRAVENOUS | Status: AC
  Filled 2023-03-29: qty 30

## 2023-03-29 MED ORDER — SODIUM CHLORIDE (PF) 0.9 % IJ SOLN
INTRAMUSCULAR | Status: AC
  Filled 2023-03-29: qty 20

## 2023-03-29 MED ORDER — SODIUM CHLORIDE 0.9 % IV SOLN
INTRAVENOUS | Status: DC | PRN
  Administered 2023-03-29: 30 mL

## 2023-03-29 MED ORDER — VANCOMYCIN HCL 500 MG IV SOLR
INTRAVENOUS | Status: AC
  Filled 2023-03-29: qty 10

## 2023-03-29 MED ORDER — HYDROMORPHONE HCL 1 MG/ML IJ SOLN: 0.2500 mg | INTRAMUSCULAR | Status: DC | PRN

## 2023-03-29 MED ORDER — DEXAMETHASONE SODIUM PHOSPHATE 10 MG/ML IJ SOLN
INTRAMUSCULAR | Status: DC | PRN
  Administered 2023-03-29: 4 mg via INTRAVENOUS

## 2023-03-29 MED ORDER — TRANEXAMIC ACID-NACL 1000-0.7 MG/100ML-% IV SOLN
INTRAVENOUS | Status: AC
  Filled 2023-03-29: qty 100

## 2023-03-29 MED ORDER — OXYCODONE HCL 5 MG PO TABS: 5.0000 mg | ORAL_TABLET | Freq: Once | ORAL | Status: DC | PRN

## 2023-03-29 MED ORDER — PROMETHAZINE HCL 25 MG/ML IJ SOLN: 6.2500 mg | INTRAMUSCULAR | Status: DC | PRN

## 2023-03-29 MED ORDER — VANCOMYCIN HCL 500 MG IV SOLR
INTRAVENOUS | Status: DC | PRN
  Administered 2023-03-29: 500 mg via TOPICAL

## 2023-03-29 MED ORDER — ONDANSETRON HCL 4 MG/2ML IJ SOLN
INTRAMUSCULAR | Status: DC | PRN
  Administered 2023-03-29: 4 mg via INTRAVENOUS

## 2023-03-29 MED ORDER — LACTATED RINGERS IV SOLN: INTRAVENOUS | Status: DC

## 2023-03-29 MED ORDER — EPHEDRINE 5 MG/ML INJ
INTRAVENOUS | Status: AC
  Filled 2023-03-29: qty 10

## 2023-03-29 MED ORDER — AMISULPRIDE (ANTIEMETIC) 5 MG/2ML IV SOLN: 10.0000 mg | Freq: Once | INTRAVENOUS | Status: DC | PRN

## 2023-03-29 MED ORDER — SODIUM CHLORIDE 0.9 % IV SOLN: INTRAVENOUS | Status: DC

## 2023-03-29 MED ORDER — BUPIVACAINE LIPOSOME 1.3 % IJ SUSP
INTRAMUSCULAR | Status: AC
  Filled 2023-03-29: qty 20

## 2023-03-29 MED ORDER — CEFAZOLIN SODIUM-DEXTROSE 2-4 GM/100ML-% IV SOLN
INTRAVENOUS | Status: AC
  Filled 2023-03-29: qty 100

## 2023-03-29 MED ORDER — PROPOFOL 10 MG/ML IV BOLUS
INTRAVENOUS | Status: DC | PRN
  Administered 2023-03-29: 100 mg via INTRAVENOUS

## 2023-03-29 MED ORDER — FENTANYL CITRATE (PF) 100 MCG/2ML IJ SOLN
INTRAMUSCULAR | Status: AC
  Filled 2023-03-29: qty 2

## 2023-03-29 MED ORDER — 0.9 % SODIUM CHLORIDE (POUR BTL) OPTIME
TOPICAL | Status: DC | PRN
  Administered 2023-03-29: 200 mL

## 2023-03-29 MED ORDER — CEFAZOLIN SODIUM-DEXTROSE 2-4 GM/100ML-% IV SOLN
2.0000 g | INTRAVENOUS | Status: AC
  Administered 2023-03-29: 2 g via INTRAVENOUS

## 2023-03-29 MED ORDER — OXYCODONE HCL 5 MG PO TABS: 5.0000 mg | ORAL_TABLET | Freq: Four times a day (QID) | ORAL | 0 refills | Status: AC | PRN

## 2023-03-29 MED ORDER — OXYCODONE HCL 5 MG/5ML PO SOLN: 5.0000 mg | Freq: Once | ORAL | Status: DC | PRN

## 2023-03-29 SURGICAL SUPPLY — 75 items
APL PRP STRL LF DISP 70% ISPRP (MISCELLANEOUS) ×1
APL SKNCLS STERI-STRIP NONHPOA (GAUZE/BANDAGES/DRESSINGS)
BANDAGE ESMARK 6X9 LF (GAUZE/BANDAGES/DRESSINGS) IMPLANT
BENZOIN TINCTURE PRP APPL 2/3 (GAUZE/BANDAGES/DRESSINGS) IMPLANT
BLADE SURG 15 STRL LF DISP TIS (BLADE) ×2 IMPLANT
BLADE SURG 15 STRL SS (BLADE) ×3
BNDG CMPR 5X3 CHSV STRCH STRL (GAUZE/BANDAGES/DRESSINGS)
BNDG CMPR 5X4 KNIT ELC UNQ LF (GAUZE/BANDAGES/DRESSINGS) ×1
BNDG CMPR 6 X 5 YARDS HK CLSR (GAUZE/BANDAGES/DRESSINGS)
BNDG CMPR 9X4 STRL LF SNTH (GAUZE/BANDAGES/DRESSINGS)
BNDG CMPR 9X6 STRL LF SNTH (GAUZE/BANDAGES/DRESSINGS)
BNDG COHESIVE 3X5 TAN ST LF (GAUZE/BANDAGES/DRESSINGS) IMPLANT
BNDG ELASTIC 4INX 5YD STR LF (GAUZE/BANDAGES/DRESSINGS) IMPLANT
BNDG ELASTIC 6INX 5YD STR LF (GAUZE/BANDAGES/DRESSINGS) IMPLANT
BNDG ESMARK 4X9 LF (GAUZE/BANDAGES/DRESSINGS) IMPLANT
BNDG ESMARK 6X9 LF (GAUZE/BANDAGES/DRESSINGS)
BNDG GAUZE DERMACEA FLUFF 4 (GAUZE/BANDAGES/DRESSINGS) IMPLANT
BNDG GZE DERMACEA 4 6PLY (GAUZE/BANDAGES/DRESSINGS)
CANISTER SUCT 1200ML W/VALVE (MISCELLANEOUS) ×1 IMPLANT
CHLORAPREP W/TINT 26 (MISCELLANEOUS) ×1 IMPLANT
CNTNR URN SCR LID CUP LEK RST (MISCELLANEOUS) IMPLANT
CONT SPEC 4OZ STRL OR WHT (MISCELLANEOUS) ×1
COVER BACK TABLE 60X90IN (DRAPES) ×1 IMPLANT
CUFF TOURN SGL QUICK 24 (TOURNIQUET CUFF)
CUFF TOURN SGL QUICK 34 (TOURNIQUET CUFF) ×1
CUFF TRNQT CYL 24X4X16.5-23 (TOURNIQUET CUFF) IMPLANT
CUFF TRNQT CYL 34X4.125X (TOURNIQUET CUFF) IMPLANT
DRAPE EXTREMITY T 121X128X90 (DISPOSABLE) ×1 IMPLANT
DRAPE INCISE IOBAN 66X45 STRL (DRAPES) IMPLANT
DRAPE OEC MINIVIEW 54X84 (DRAPES) IMPLANT
DRAPE SURG 17X23 STRL (DRAPES) IMPLANT
DRAPE U-SHAPE 47X51 STRL (DRAPES) ×1 IMPLANT
DRSG MEPITEL 4X7.2 (GAUZE/BANDAGES/DRESSINGS) ×1 IMPLANT
ELECT REM PT RETURN 9FT ADLT (ELECTROSURGICAL) ×1
ELECTRODE REM PT RTRN 9FT ADLT (ELECTROSURGICAL) ×1 IMPLANT
GAUZE PAD ABD 8X10 STRL (GAUZE/BANDAGES/DRESSINGS) IMPLANT
GAUZE SPONGE 4X4 12PLY STRL (GAUZE/BANDAGES/DRESSINGS) ×1 IMPLANT
GLOVE BIO SURGEON STRL SZ8 (GLOVE) ×1 IMPLANT
GLOVE BIOGEL PI IND STRL 8 (GLOVE) ×2 IMPLANT
GLOVE ECLIPSE 8.0 STRL XLNG CF (GLOVE) ×1 IMPLANT
GOWN STRL REUS W/ TWL LRG LVL3 (GOWN DISPOSABLE) ×1 IMPLANT
GOWN STRL REUS W/ TWL XL LVL3 (GOWN DISPOSABLE) ×2 IMPLANT
GOWN STRL REUS W/TWL LRG LVL3 (GOWN DISPOSABLE) ×1
GOWN STRL REUS W/TWL XL LVL3 (GOWN DISPOSABLE) ×2
MANIFOLD NEPTUNE II (INSTRUMENTS) ×1 IMPLANT
NDL HYPO 22X1.5 SAFETY MO (MISCELLANEOUS) IMPLANT
NEEDLE HYPO 22X1.5 SAFETY MO (MISCELLANEOUS)
PACK BASIN DAY SURGERY FS (CUSTOM PROCEDURE TRAY) ×1 IMPLANT
PAD CAST 4YDX4 CTTN HI CHSV (CAST SUPPLIES) ×2 IMPLANT
PADDING CAST COTTON 4X4 STRL (CAST SUPPLIES) ×1
PADDING CAST COTTON 6X4 STRL (CAST SUPPLIES) IMPLANT
PENCIL SMOKE EVACUATOR (MISCELLANEOUS) ×1 IMPLANT
SANITIZER HAND PURELL FF 515ML (MISCELLANEOUS) ×1 IMPLANT
SET IRRIG Y TYPE TUR BLADDER L (SET/KITS/TRAYS/PACK) ×1 IMPLANT
SHEET MEDIUM DRAPE 40X70 STRL (DRAPES) ×1 IMPLANT
SLEEVE SCD COMPRESS KNEE MED (STOCKING) ×1 IMPLANT
SPIKE FLUID TRANSFER (MISCELLANEOUS) IMPLANT
SPLINT PLASTER CAST FAST 5X30 (CAST SUPPLIES) IMPLANT
SPONGE T-LAP 18X18 ~~LOC~~+RFID (SPONGE) ×1 IMPLANT
STOCKINETTE 6 STRL (DRAPES) ×1 IMPLANT
STRIP CLOSURE SKIN 1/2X4 (GAUZE/BANDAGES/DRESSINGS) IMPLANT
SUCTION TUBE FRAZIER 10FR DISP (SUCTIONS) IMPLANT
SUT ETHILON 3 0 PS 1 (SUTURE) IMPLANT
SUT MNCRL AB 3-0 PS2 18 (SUTURE) ×1 IMPLANT
SUT VIC AB 2-0 SH 18 (SUTURE) IMPLANT
SUT VIC AB 2-0 SH 27 (SUTURE)
SUT VIC AB 2-0 SH 27XBRD (SUTURE) IMPLANT
SUT VICRYL 0 SH 27 (SUTURE) IMPLANT
SYR BULB EAR ULCER 3OZ GRN STR (SYRINGE) ×1 IMPLANT
SYR CONTROL 10ML LL (SYRINGE) IMPLANT
TOWEL GREEN STERILE FF (TOWEL DISPOSABLE) ×1 IMPLANT
TRAY DSU PREP LF (CUSTOM PROCEDURE TRAY) IMPLANT
TUBE CONNECTING 20X1/4 (TUBING) ×1 IMPLANT
UNDERPAD 30X36 HEAVY ABSORB (UNDERPADS AND DIAPERS) ×1 IMPLANT
YANKAUER SUCT BULB TIP NO VENT (SUCTIONS) ×1 IMPLANT

## 2023-03-29 NOTE — Anesthesia Preprocedure Evaluation (Signed)
Anesthesia Evaluation  Patient identified by MRN, date of birth, ID band Patient awake    Reviewed: Allergy & Precautions, H&P , NPO status , Patient's Chart, lab work & pertinent test results  History of Anesthesia Complications (+) PONV and history of anesthetic complications  Airway Mallampati: II  TM Distance: >3 FB Neck ROM: Full    Dental no notable dental hx. (+) Teeth Intact, Dental Advisory Given   Pulmonary neg pulmonary ROS, former smoker   Pulmonary exam normal breath sounds clear to auscultation       Cardiovascular hypertension, Pt. on medications +CHF  Normal cardiovascular exam+ dysrhythmias  Rhythm:Regular Rate:Normal  EKG: 11/11/2018 Rate 41  Sinus bradycardia with 2nd degree A-V block (Mobitz II) Abnormal ECG since last tracing the rate has slowed and sinus node dysfunction and the rate is slower  CV:  Echo 03/21/17 Study Conclusions  - Left ventricle: The cavity size was normal. Systolic function wasnormal. The estimated ejection fraction was in the range of 60%to 65%. Wall motion was normal; there were no regional wallmotion abnormalities. There was a reduced contribution of atrialcontraction to ventricular filling, due to increased ventriculardiastolic pressure or atrial contractile dysfunction. Doppler parameters are consistent with a reversible restrictive pattern,indicative of decreased left ventricular diastolic complianceand/or increased left atrial pressure (grade 3 diastolicdysfunction). Doppler parameters are consistent with highventricular filling pressure. - Aortic valve: Poorly visualized. - Mitral valve: Calcified annulus. There was mild regurgitation. - Left atrium: The atrium was mildly to moderately dilated. Anterior-posterior dimension: 48 mm. - Right atrium: The atrium was mildly dilated. - Pulmonic valve: Poorly visualized. - Pulmonary arteries: Systolic pressure could not  be accuratelyestimated.   Neuro/Psych  Neuromuscular disease CVA, Residual Symptoms  negative psych ROS   GI/Hepatic Neg liver ROS,GERD  Medicated,,  Endo/Other  diabetes  Obesity   Renal/GU negative Renal ROS  negative genitourinary   Musculoskeletal  (+) Arthritis , Osteoarthritis,    Abdominal   Peds negative pediatric ROS (+)  Hematology negative hematology ROS (+) Plt 378k   Anesthesia Other Findings Day of surgery medications reviewed with the patient.  Reproductive/Obstetrics negative OB ROS                                                              Anesthesia Evaluation  Patient identified by MRN, date of birth, ID band Patient awake    Reviewed: Allergy & Precautions, NPO status , Patient's Chart, lab work & pertinent test results, reviewed documented beta blocker date and time   Airway Mallampati: II  TM Distance: >3 FB Neck ROM: Full    Dental  (+) Teeth Intact, Dental Advisory Given   Pulmonary former smoker,    Pulmonary exam normal breath sounds clear to auscultation       Cardiovascular hypertension, Pt. on home beta blockers and Pt. on medications +CHF  + dysrhythmias Atrial Fibrillation  Rhythm:Irregular Rate:Normal  Echo 03/21/17: Study Conclusions   - Left ventricle: The cavity size was normal. Systolic function was normal. The estimated ejection fraction was in the range of 60% to 65%. Wall motion was normal; there were no regional wall motion abnormalities. There was a reduced contribution of atrial contraction to ventricular filling, due to increased ventricular diastolic pressure or atrial contractile dysfunction. Doppler   parameters are consistent with  a reversible restrictive pattern, indicative of decreased left ventricular diastolic compliance and/or increased left atrial pressure (grade 3 diastolic dysfunction). Doppler parameters are consistent with high ventricular filling pressure. - Aortic  valve: Poorly visualized. - Mitral valve: Calcified annulus. There was mild regurgitation. - Left atrium: The atrium was mildly to moderately dilated.   Anterior-posterior dimension: 48 mm. - Right atrium: The atrium was mildly dilated. - Pulmonic valve: Poorly visualized. - Pulmonary arteries: Systolic pressure could not be accurately estimated.   Neuro/Psych CVA    GI/Hepatic Neg liver ROS, GERD  Medicated,  Endo/Other  diabetes, Type 2Obesity   Renal/GU negative Renal ROS     Musculoskeletal  (+) Arthritis , left displaced bimalleolar fracture   Abdominal   Peds  Hematology negative hematology ROS (+)   Anesthesia Other Findings Day of surgery medications reviewed with the patient.  Reproductive/Obstetrics                             Anesthesia Physical Anesthesia Plan  ASA: III  Anesthesia Plan: General   Post-op Pain Management:  Regional for Post-op pain   Induction: Intravenous  PONV Risk Score and Plan: 3 and Ondansetron and Dexamethasone  Airway Management Planned: LMA  Additional Equipment:   Intra-op Plan:   Post-operative Plan: Extubation in OR  Informed Consent: I have reviewed the patients History and Physical, chart, labs and discussed the procedure including the risks, benefits and alternatives for the proposed anesthesia with the patient or authorized representative who has indicated his/her understanding and acceptance.   Dental advisory given  Plan Discussed with: CRNA  Anesthesia Plan Comments:         Anesthesia Quick Evaluation  Anesthesia Physical Anesthesia Plan  ASA: III  Anesthesia Plan: General   Post-op Pain Management:  Regional for Post-op pain and Minimal or no pain anticipated   Induction: Intravenous  PONV Risk Score and Plan: 4 or greater and Treatment may vary due to age or medical condition, Ondansetron, Dexamethasone, Midazolam and Droperidol  Airway Management Planned:  LMA  Additional Equipment:   Intra-op Plan:   Post-operative Plan: Extubation in OR  Informed Consent: I have reviewed the patients History and Physical, chart, labs and discussed the procedure including the risks, benefits and alternatives for the proposed anesthesia with the patient or authorized representative who has indicated his/her understanding and acceptance.     Dental advisory given  Plan Discussed with: CRNA, Anesthesiologist and Surgeon  Anesthesia Plan Comments:         Anesthesia Quick Evaluation

## 2023-03-29 NOTE — Discharge Instructions (Addendum)
John Hewitt, MD [facility]  Please read the following information regarding your care after surgery.  Medications  You only need a prescription for the narcotic pain medicine (ex. oxycodone, Percocet, Norco).  All of the other medicines listed below are available over the counter. ? Aleve 2 pills twice a day for the first 3 days after surgery. ? acetominophen (Tylenol) 650 mg every 4-6 hours as you need for minor to moderate pain ? oxycodone as prescribed for severe pain  Narcotic pain medicine (ex. oxycodone, Percocet, Vicodin) will cause constipation.  To prevent this problem, take the following medicines while you are taking any pain medicine. ? docusate sodium (Colace) 100 mg twice a day ? senna (Senokot) 2 tablets twice a day  Weight Bearing ? Bear weight only on your operated foot in the CAM boot.   Cast / Splint / Dressing ? Keep your splint, cast or dressing clean and dry.  Don't put anything (coat hanger, pencil, etc) down inside of it.  If it gets damp, use a hair dryer on the cool setting to dry it.  If it gets soaked, call the office to schedule an appointment for a cast change.   After your dressing, cast or splint is removed; you may shower, but do not soak or scrub the wound.  Allow the water to run over it, and then gently pat it dry.  Swelling It is normal for you to have swelling where you had surgery.  To reduce swelling and pain, keep your toes above your nose for at least 3 days after surgery.  It may be necessary to keep your foot or leg elevated for several weeks.  If it hurts, it should be elevated.  Follow Up Call my office at [phone] when you are discharged from the hospital or surgery center to schedule an appointment to be seen two weeks after surgery.  Call my office at [phone] if you develop a fever >101.5 F, nausea, vomiting, bleeding from the surgical site or severe pain.        Post Anesthesia Home Care Instructions  Activity: Get  plenty of rest for the remainder of the day. A responsible individual must stay with you for 24 hours following the procedure.  For the next 24 hours, DO NOT: -Drive a car -Operate machinery -Drink alcoholic beverages -Take any medication unless instructed by your physician -Make any legal decisions or sign important papers.  Meals: Start with liquid foods such as gelatin or soup. Progress to regular foods as tolerated. Avoid greasy, spicy, heavy foods. If nausea and/or vomiting occur, drink only clear liquids until the nausea and/or vomiting subsides. Call your physician if vomiting continues.  Special Instructions/Symptoms: Your throat may feel dry or sore from the anesthesia or the breathing tube placed in your throat during surgery. If this causes discomfort, gargle with warm salt water. The discomfort should disappear within 24 hours.  If you had a scopolamine patch placed behind your ear for the management of post- operative nausea and/or vomiting:  1. The medication in the patch is effective for 72 hours, after which it should be removed.  Wrap patch in a tissue and discard in the trash. Wash hands thoroughly with soap and water. 2. You may remove the patch earlier than 72 hours if you experience unpleasant side effects which may include dry mouth, dizziness or visual disturbances. 3. Avoid touching the patch. Wash your hands with soap and water after contact with the patch.      

## 2023-03-29 NOTE — Transfer of Care (Signed)
Immediate Anesthesia Transfer of Care Note  Patient: Karen Jackson  Procedure(s) Performed: LEFT ANKLE WOUND IRRIGATION AND DEBRIDEMENT WOUND (Left: Ankle) HARDWARE REMOVAL (Left: Ankle)  Patient Location: PACU  Anesthesia Type:General  Level of Consciousness: drowsy and patient cooperative  Airway & Oxygen Therapy: Patient Spontanous Breathing and Patient connected to face mask oxygen  Post-op Assessment: Report given to RN and Post -op Vital signs reviewed and stable  Post vital signs: Reviewed and stable  Last Vitals:  Vitals Value Taken Time  BP 158/84 03/29/23 1216  Temp    Pulse 97 03/29/23 1217  Resp 13 03/29/23 1217  SpO2 100 % 03/29/23 1217  Vitals shown include unfiled device data.  Last Pain:  Vitals:   03/29/23 0939  PainSc: 5       Patients Stated Pain Goal: 3 (03/29/23 0939)  Complications: No notable events documented.

## 2023-03-29 NOTE — Anesthesia Procedure Notes (Signed)
Procedure Name: LMA Insertion Date/Time: 03/29/2023 11:22 AM  Performed by: , Jewel Baize, CRNAPre-anesthesia Checklist: Patient identified, Emergency Drugs available, Suction available and Patient being monitored Patient Re-evaluated:Patient Re-evaluated prior to induction Oxygen Delivery Method: Circle system utilized Preoxygenation: Pre-oxygenation with 100% oxygen Induction Type: IV induction Ventilation: Mask ventilation without difficulty LMA: LMA inserted LMA Size: 4.0 Number of attempts: 1 Airway Equipment and Method: Bite block Placement Confirmation: positive ETCO2 Tube secured with: Tape Dental Injury: Teeth and Oropharynx as per pre-operative assessment

## 2023-03-29 NOTE — Op Note (Signed)
03/29/2023  12:21 PM  PATIENT:  Karen Jackson  82 y.o. female  PRE-OPERATIVE DIAGNOSIS:  1.  left ankle painful hardware      2.  Left ankle wound dehiscence  POST-OPERATIVE DIAGNOSIS: same  Procedure(s):  removal of deep implants left ankle  SURGEON:  Toni Arthurs, MD  ASSISTANT: Alfredo Martinez, PA-C  ANESTHESIA:   General, local  EBL:  50 cc   TOURNIQUET:   Total Tourniquet Time Documented: Thigh (Left) - 2 minutes Total: Thigh (Left) - 2 minutes  COMPLICATIONS:  None apparent  DISPOSITION:  Extubated, awake and stable to recovery.  INDICATION FOR PROCEDURE:  82 y/o female with a h/o left ankle ORIF in 2019 c/o pain at the hardware and drainage from the lateral ankle for the last couple of months.  She is on cipro and presents today for removal of the deep implants.  The risks and benefits of the alternative treatment options have been discussed in detail.  The patient wishes to proceed with surgery and specifically understands risks of bleeding, infection, nerve damage, blood clots, need for additional surgery, amputation and death.   PROCEDURE IN DETAIL: After preoperative consent was obtained the correct operative site was identified, the patient was brought to the operating room and placed on the operating table.  General anesthesia was induced.  Preoperative antibiotics were held pending cultures.  A surgical timeout.  She has pain to the left lower extremity and draped in a standard with a tourniquet around the cast.  The extremity was elevated, and the tourniquet inflated to 200 mmHg.  The previous lateral incision was opened sharply.  Dissection was carried distally to the punctate wound which was excised completely.  Proximally there was a wound area on the lateral leg as approximated incision.  The skin was friable and fluctuant.  This was sharply excised as well.  Dissection was then carried through the superficial aspect of the plate.  Screws were removed from the plate without  difficulty.  The plate was then removed entirety.  The lag screw was identified as well.  The surrounding soft tissues were quite edematous and friable.  All inflamed tissue was sharply debrided.  Deep tissue was sent as a specimen to microbiology for aerobic and anaerobic culture.  Perioperative antibiotics were administered.  The soft tissues and lateral fibula were debrided with a curette.  The wound was irrigated copiously with 3 L of normal saline.  All nonviable tissue and then removed.  The wound was then sprinkled with vancomycin powder and loosely approximated with horizontal mattress sutures of 2-0 nylon.  Subcutaneous tissues were anesthetized with Exparel and normal saline.  Sterile dressings were applied followed by a compression wrap and a cam boot placed.    FOLLOW UP PLAN: Weightbearing as tolerated in the boot.  Resume Eliquis postop day 1.  Follow-up in the office in 2 weeks for culture results and further antibiotic decisions.  Alfredo Martinez PA-C was present and scrubbed for the duration of the operative case. His assistance was essential in positioning the patient, prepping and draping, gaining and maintaining exposure, performing the operation, closing and dressing the wounds and applying the splint.

## 2023-03-29 NOTE — H&P (Signed)
Karen Jackson is an 82 y.o. female.   Chief Complaint: L ankle painful hardware HPI: 82 y/o female with left ankle painful hardware s/p ankle ORIF in the remote past.  She has had some intermittent drainage from the distal end of the incision.  She presents now for removal of deep implants.  Past Medical History:  Diagnosis Date   Acute cystitis without hematuria    Acute diastolic CHF (congestive heart failure) (HCC)    Arthritis    Dyspnea    Dysrhythmia    Fever of unknown origin 03/19/2017   Hyperlipidemia    Hypertension    denies at preop   Multifocal pneumonia    Neuromuscular disorder (HCC)    neuropathy left arm and foot   Osteopenia    Paralysis (HCC)    partial left side from CVA    Persistent atrial fibrillation (HCC)    PONV (postoperative nausea and vomiting)    Pre-diabetes    Stroke [facility]) 2013   hemmorahgic    Past Surgical History:  Procedure Laterality Date   ANKLE SURGERY     APPENDECTOMY     CHOLECYSTECTOMY     HERNIA REPAIR     Esophagus   JOINT REPLACEMENT     total- right partial- left   MASTECTOMY PARTIAL / LUMPECTOMY  2012   left   ORIF ANKLE FRACTURE Left 07/20/2018   Procedure: OPEN REDUCTION INTERNAL FIXATION (ORIF) ANKLE FRACTURE;  Surgeon: Toni Arthurs, MD;  Location: [facility] OR;  Service: Orthopedics;  Laterality: Left;   TOTAL KNEE ARTHROPLASTY Left 01/27/2019   Procedure: TOTAL KNEE ARTHROPLASTY;  Surgeon: Dannielle Huh, MD;  Location: [facility] ORS;  Service: Orthopedics;  Laterality: Left;    Family History  Problem Relation Age of Onset   Diabetes Mother    Transient ischemic attack Father    Dementia Father    Social History:  reports that she quit smoking about 42 years ago. Her smoking use included cigarettes. She started smoking about 65 years ago. She has a 11.8 pack-year smoking history. She has never used smokeless tobacco. She reports current alcohol use of about 16.0 standard drinks of alcohol per week. She reports that she does not use  drugs.  Allergies:  Allergies  Allergen Reactions   Codeine Phosphate Other (See Comments)    Medications Prior to Admission  Medication Sig Dispense Refill   apixaban (ELIQUIS) 5 MG TABS tablet Take 1 tablet (5 mg total) by mouth 2 (two) times daily. 60 tablet 11   atorvastatin (LIPITOR) 40 MG tablet TAKE 1 TABLET(40 MG) BY MOUTH DAILY 90 tablet 3   Calcium Carb-Cholecalciferol (CALCIUM CARBONATE-VITAMIN D3) 600-400 MG-UNIT TABS Take 1 tablet by mouth daily.     celecoxib (CELEBREX) 200 MG capsule TAKE 1 CAPSULE BY MOUTH DAILY AS NEEDED FOR ARTHRITIS OR PAIN 90 capsule 3   ciprofloxacin (CIPRO) 500 MG/5ML (10%) suspension Take 500 mg by mouth 2 (two) times daily. Do NOT administer via tube     diclofenac Sodium (VOLTAREN) 1 % GEL APPLY 2 GRAMS EXTERNALLY TO THE AFFECTED AREA FOUR TIMES DAILY     ergocalciferol (VITAMIN D2) 1.25 MG (50000 UT) capsule 50,000 unit     furosemide (LASIX) 80 MG tablet TAKE 1 TABLET BY MOUTH  DAILY 90 tablet 3   gabapentin (NEURONTIN) 100 MG capsule TAKE 1 CAPSULE BY MOUTH 3  TIMES DAILY 270 capsule 3   hydrocortisone 2.5 % cream Apply topically 2 (two) times daily as needed.  lidocaine (LIDODERM) 5 % PLACE 1 PATCH ONTO THE SKIN DAILY. REMOVE AND DISCORD PATCH WITHIN 12 HOURS OR AS DIRECTED BY MD 30 patch 12   MAGnesium-Oxide 400 (240 Mg) MG tablet Take 1 tablet (400 mg total) by mouth 2 (two) times daily. 90 tablet 3   metoprolol succinate (TOPROL-XL) 50 MG 24 hr tablet Take 1 tablet (50 mg total) by mouth daily. Take with or immediately following a meal. 90 tablet 3   omeprazole (PRILOSEC) 20 MG capsule TAKE 1 CAPSULE BY MOUTH  DAILY 90 capsule 3   potassium chloride SA (KLOR-CON M20) 20 MEQ tablet Take 1 tablet (20 mEq total) by mouth 2 (two) times daily. 180 tablet 3   traZODone (DESYREL) 50 MG tablet TAKE 1 TABLET BY MOUTH AT  BEDTIME 90 tablet 3   TURMERIC PO Take 1 capsule by mouth daily.     Semaglutide, 1 MG/DOSE, (OZEMPIC, 1 MG/DOSE,) 4 MG/3ML SOPN  Inject 1 mg into the skin once a week.      No results found. However, due to the size of the patient record, not all encounters were searched. Please check Results Review for a complete set of results. No results found.  Review of Systems  no recent f/c/n/v/wt loss  Height 4\' 10"  (1.473 m), weight 72 kg. Physical Exam  Wn wd elderly woman in nad.   A and O.  EOMI.  Resp unlabored.  L ankle with a healed lateral incision.  No cellulitis.  Distal there is a small wound and proximally a small wound.  Scant serous drainage.    Assessment/Plan L ankle painful hardware - to the OR today for removal of deep implants.  The risks and benefits of the alternative treatment options have been discussed in detail.  The patient wishes to proceed with surgery and specifically understands risks of bleeding, infection, nerve damage, blood clots, need for additional surgery, amputation and death.   Toni Arthurs, MD 04/22/2023, 9:02 AM

## 2023-03-29 NOTE — Anesthesia Postprocedure Evaluation (Signed)
Anesthesia Post Note  Patient: Karen Jackson  Procedure(s) Performed: LEFT ANKLE WOUND IRRIGATION AND DEBRIDEMENT WOUND (Left: Ankle) HARDWARE REMOVAL (Left: Ankle)     Patient location during evaluation: PACU Anesthesia Type: General Level of consciousness: awake and alert Pain management: pain level controlled Vital Signs Assessment: post-procedure vital signs reviewed and stable Respiratory status: spontaneous breathing, nonlabored ventilation and respiratory function stable Cardiovascular status: blood pressure returned to baseline and stable Postop Assessment: no apparent nausea or vomiting Anesthetic complications: no   No notable events documented.  Last Vitals:  Vitals:   03/29/23 1230 03/29/23 1245  BP: (!) 145/82 (!) 153/62  Pulse: 84 83  Resp: 16 17  Temp:    SpO2: 100% 94%    Last Pain:  Vitals:   03/29/23 1245  PainSc: 0-No pain                 Lowella Curb

## 2023-03-30 ENCOUNTER — Encounter (HOSPITAL_BASED_OUTPATIENT_CLINIC_OR_DEPARTMENT_OTHER): Payer: Self-pay | Admitting: Orthopedic Surgery

## 2023-04-17 DIAGNOSIS — E785 Hyperlipidemia, unspecified: Secondary | ICD-10-CM | POA: Diagnosis not present

## 2023-04-17 DIAGNOSIS — E611 Iron deficiency: Secondary | ICD-10-CM | POA: Diagnosis not present

## 2023-04-17 DIAGNOSIS — D649 Anemia, unspecified: Secondary | ICD-10-CM | POA: Diagnosis not present

## 2023-04-17 DIAGNOSIS — I10 Essential (primary) hypertension: Secondary | ICD-10-CM | POA: Diagnosis not present

## 2023-04-17 DIAGNOSIS — M810 Age-related osteoporosis without current pathological fracture: Secondary | ICD-10-CM | POA: Diagnosis not present

## 2023-04-17 DIAGNOSIS — E1159 Type 2 diabetes mellitus with other circulatory complications: Secondary | ICD-10-CM | POA: Diagnosis not present

## 2023-04-18 ENCOUNTER — Ambulatory Visit: Payer: Medicare Other | Admitting: Podiatry

## 2023-04-24 DIAGNOSIS — I6389 Other cerebral infarction: Secondary | ICD-10-CM | POA: Diagnosis not present

## 2023-04-24 DIAGNOSIS — L89523 Pressure ulcer of left ankle, stage 3: Secondary | ICD-10-CM | POA: Diagnosis not present

## 2023-04-24 DIAGNOSIS — M810 Age-related osteoporosis without current pathological fracture: Secondary | ICD-10-CM | POA: Diagnosis not present

## 2023-04-24 DIAGNOSIS — I10 Essential (primary) hypertension: Secondary | ICD-10-CM | POA: Diagnosis not present

## 2023-04-24 DIAGNOSIS — Z1339 Encounter for screening examination for other mental health and behavioral disorders: Secondary | ICD-10-CM | POA: Diagnosis not present

## 2023-04-24 DIAGNOSIS — I69354 Hemiplegia and hemiparesis following cerebral infarction affecting left non-dominant side: Secondary | ICD-10-CM | POA: Diagnosis not present

## 2023-04-24 DIAGNOSIS — M1990 Unspecified osteoarthritis, unspecified site: Secondary | ICD-10-CM | POA: Diagnosis not present

## 2023-04-24 DIAGNOSIS — R82998 Other abnormal findings in urine: Secondary | ICD-10-CM | POA: Diagnosis not present

## 2023-04-24 DIAGNOSIS — Z1331 Encounter for screening for depression: Secondary | ICD-10-CM | POA: Diagnosis not present

## 2023-04-24 DIAGNOSIS — Z0000 Encounter for general adult medical examination without abnormal findings: Secondary | ICD-10-CM | POA: Diagnosis not present

## 2023-04-24 DIAGNOSIS — E1159 Type 2 diabetes mellitus with other circulatory complications: Secondary | ICD-10-CM | POA: Diagnosis not present

## 2023-04-24 DIAGNOSIS — E785 Hyperlipidemia, unspecified: Secondary | ICD-10-CM | POA: Diagnosis not present

## 2023-04-24 DIAGNOSIS — I4821 Permanent atrial fibrillation: Secondary | ICD-10-CM | POA: Diagnosis not present

## 2023-04-25 ENCOUNTER — Encounter: Payer: Self-pay | Admitting: Podiatry

## 2023-04-25 ENCOUNTER — Ambulatory Visit: Payer: Medicare Other | Admitting: Podiatry

## 2023-04-25 DIAGNOSIS — E118 Type 2 diabetes mellitus with unspecified complications: Secondary | ICD-10-CM

## 2023-04-25 DIAGNOSIS — B351 Tinea unguium: Secondary | ICD-10-CM

## 2023-04-25 DIAGNOSIS — M79609 Pain in unspecified limb: Secondary | ICD-10-CM | POA: Diagnosis not present

## 2023-04-25 NOTE — Progress Notes (Signed)
This patient returns to my office for at risk foot care.  This patient requires this care by a professional since this patient will be at risk due to having diabetes.  This patient is unable to cut nails herself since the patient cannot reach her nails.These nails are painful walking and wearing shoes. Patient has coagulation defect due to taking eliquis. Patient presents to the office with her caregiver and in a wheelchair. This patient presents for at risk foot care today.  General Appearance  Alert, conversant and in no acute stress.  Vascular  Dorsalis pedis and posterior tibial  pulses are palpable  bilaterally.  Capillary return is within normal limits  bilaterally. Temperature is within normal limits  bilaterally.  Neurologic  Senn-Weinstein monofilament wire test within normal limits  bilaterally. Muscle power within normal limits bilaterally.  Nails Thick disfigured discolored nails with subungual debris  from hallux to fifth toes bilaterally. No evidence of bacterial infection or drainage bilaterally.  Orthopedic  No limitations of motion  feet .  No crepitus or effusions noted.  No bony pathology or digital deformities noted.  Skin  normotropic skin with no porokeratosis noted bilaterally.  No signs of infections or ulcers noted.     Onychomycosis  Pain in right toes  Pain in left toes  Consent was obtained for treatment procedures.   Mechanical debridement of nails 1-5  bilaterally performed with a nail nipper.  Filed with dremel without incident.    Return office visit  4   months                   Told patient to return for periodic foot care and evaluation due to potential at risk complications.   Helane Gunther DPM

## 2023-04-26 DIAGNOSIS — Z23 Encounter for immunization: Secondary | ICD-10-CM | POA: Diagnosis not present

## 2023-05-01 DIAGNOSIS — Z23 Encounter for immunization: Secondary | ICD-10-CM | POA: Diagnosis not present

## 2023-05-15 ENCOUNTER — Ambulatory Visit: Payer: Medicare Other | Admitting: Physical Therapy

## 2023-05-17 ENCOUNTER — Encounter: Payer: Medicare Other | Admitting: Physical Therapy

## 2023-05-22 ENCOUNTER — Ambulatory Visit: Payer: Medicare Other | Attending: Internal Medicine | Admitting: Physical Therapy

## 2023-05-22 ENCOUNTER — Encounter: Payer: Self-pay | Admitting: Physical Therapy

## 2023-05-22 DIAGNOSIS — M6281 Muscle weakness (generalized): Secondary | ICD-10-CM | POA: Insufficient documentation

## 2023-05-22 DIAGNOSIS — R293 Abnormal posture: Secondary | ICD-10-CM | POA: Diagnosis not present

## 2023-05-22 DIAGNOSIS — I69354 Hemiplegia and hemiparesis following cerebral infarction affecting left non-dominant side: Secondary | ICD-10-CM | POA: Insufficient documentation

## 2023-05-22 DIAGNOSIS — M542 Cervicalgia: Secondary | ICD-10-CM | POA: Diagnosis not present

## 2023-05-22 DIAGNOSIS — R270 Ataxia, unspecified: Secondary | ICD-10-CM | POA: Diagnosis not present

## 2023-05-22 DIAGNOSIS — M25572 Pain in left ankle and joints of left foot: Secondary | ICD-10-CM | POA: Diagnosis not present

## 2023-05-22 DIAGNOSIS — M25512 Pain in left shoulder: Secondary | ICD-10-CM | POA: Diagnosis not present

## 2023-05-22 DIAGNOSIS — R262 Difficulty in walking, not elsewhere classified: Secondary | ICD-10-CM | POA: Insufficient documentation

## 2023-05-22 DIAGNOSIS — I6990 Unspecified sequelae of unspecified cerebrovascular disease: Secondary | ICD-10-CM | POA: Diagnosis not present

## 2023-05-22 NOTE — Therapy (Signed)
OUTPATIENT PHYSICAL THERAPY LOWER EXTREMITY EVALUATION   Patient Name: Karen Jackson MRN: [id number] DOB:29-Apr-1941, 82 y.o., female Today's Date: 05/22/2023  END OF SESSION:  PT End of Session - 05/22/23 1101     Visit Number 1    Date for PT Re-Evaluation 08/21/23    Authorization Type Medicare    PT Start Time 1055    PT Stop Time 1140    PT Time Calculation (min) 45 min    Activity Tolerance Patient tolerated treatment well;Patient limited by pain    Behavior During Therapy [facility] for tasks assessed/performed             Past Medical History:  Diagnosis Date   Acute cystitis without hematuria    Acute diastolic CHF (congestive heart failure) (HCC)    Arthritis    Dyspnea    Dysrhythmia    Fever of unknown origin 03/19/2017   Hyperlipidemia    Hypertension    denies at preop   Multifocal pneumonia    Neuromuscular disorder (HCC)    neuropathy left arm and foot   Osteopenia    Paralysis (HCC)    partial left side from CVA    Persistent atrial fibrillation (HCC)    PONV (postoperative nausea and vomiting)    Pre-diabetes    Stroke [facility]) 2013   hemmorahgic   Past Surgical History:  Procedure Laterality Date   ANKLE SURGERY     APPENDECTOMY     CHOLECYSTECTOMY     HARDWARE REMOVAL Left 03/29/2023   Procedure: HARDWARE REMOVAL;  Surgeon: Toni Arthurs, MD;  Location: [facility];  Service: Orthopedics;  Laterality: Left;   HERNIA REPAIR     Esophagus   INCISION AND DRAINAGE OF WOUND Left 03/29/2023   Procedure: LEFT ANKLE WOUND IRRIGATION AND DEBRIDEMENT WOUND;  Surgeon: Toni Arthurs, MD;  Location: [facility];  Service: Orthopedics;  Laterality: Left;   JOINT REPLACEMENT     total- right partial- left   MASTECTOMY PARTIAL / LUMPECTOMY  2012   left   ORIF ANKLE FRACTURE Left 07/20/2018   Procedure: OPEN REDUCTION INTERNAL FIXATION (ORIF) ANKLE FRACTURE;  Surgeon: Toni Arthurs, MD;  Location: [facility];  Service: Orthopedics;  Laterality:  Left;   TOTAL KNEE ARTHROPLASTY Left 01/27/2019   Procedure: TOTAL KNEE ARTHROPLASTY;  Surgeon: Dannielle Huh, MD;  Location: [facility];  Service: Orthopedics;  Laterality: Left;   Patient Active Problem List   Diagnosis Date Noted   Goals of care, counseling/discussion 01/17/2022   Grief 01/17/2022   Secondary hypercoagulable state (HCC) 03/16/2021   Dizziness 03/10/2021   Orthostatic hypotension 10/15/2020   Presbycusis of both ears 03/08/2020   Mixed stress and urge urinary incontinence 12/02/2019   Macrocytosis 12/01/2019   Nutritional anemia 12/01/2019   S/P total knee replacement 01/27/2019   Recurrent left knee instability 07/05/2018   Respiratory failure with hypoxia (HCC) 08/30/2017   Hypoxemia    Heart failure with preserved ejection fraction (HCC), Grade 3 diastolic dysfunction 03/26/2017   PAF (paroxysmal atrial fibrillation) (HCC)    Dyspnea 03/19/2017   Encounter for preventive health examination 02/17/2016   Sensorineural hearing loss (SNHL), bilateral 01/26/2016   Hypomagnesemia 04/24/2014   Hemiparesis affecting left side as late effect of cerebrovascular accident (HCC) 04/24/2014   Nontraumatic cerebral hemorrhage (HCC) 04/30/2012   DM (diabetes mellitus) with complications (HCC) 03/04/2010   OSTEOPENIA 01/21/2009   UNSPECIFIED VITAMIN D DEFICIENCY 11/19/2007   HYPERCHOLESTEROLEMIA 10/25/2006   GASTROESOPHAGEAL REFLUX, NO ESOPHAGITIS 10/25/2006  DIVERTICULOSIS OF COLON 10/25/2006   Osteoarthritis 10/25/2006   CERVICAL SPINE DISORDER, NOS 10/25/2006    PCP: Waynard Edwards, MD  REFERRING PROVIDER: Waynard Edwards, MD  REFERRING DIAG: s/p left ankle ORIF hardware removal  THERAPY DIAG:  Difficulty in walking, not elsewhere classified  Muscle weakness (generalized)  Hemiplegia and hemiparesis following cerebral infarction affecting left non-dominant side (HCC)  Ataxia  Late effects of CVA (cerebrovascular accident)  Abnormal posture  Cervicalgia  Rationale for  Evaluation and Treatment: Rehabilitation  ONSET DATE: 03/29/23  SUBJECTIVE:   SUBJECTIVE STATEMENT: Patient underwent left ankle ORIF hardware removal on 03/29/23 due to infection.  She reports that she also was having issues with wounds on the ankle, this is better now.  Reports that she has been having pain and issues with the ankle for over a year and has "gone to hell in a hand basket", just really "downhill, not doing well, she reports that she got covid 2 weeks ago and again set back with her recovery  PERTINENT HISTORY: CVA, CHF, HTN, A-fib, TKA PAIN:  Are you having pain? Yes: NPRS scale: 7/10 Pain location: left upper trap and neck, left ankle and knee  Pain description: ache sore Aggravating factors: neck and shoulder always hurt, knee and ankle hurt with transfers Relieving factors: massage, heat  PRECAUTIONS: None  RED FLAGS: None   WEIGHT BEARING RESTRICTIONS: No  FALLS:  Has patient fallen in last 6 months? No  LIVING ENVIRONMENT: Lives with: lives alone Lives in: House/apartment Stairs: No Has following equipment at home: Environmental consultant - 2 wheeled, Wheelchair (manual), shower chair, bed side commode, Grab bars, and Ramped entry  OCCUPATION: retired  PLOF: Needs assistance with homemaking, Needs assistance with transfers, and Leisure: plays bridge, she has 11 hours of an aide at home that helps with meals, going to MD's, dressing and bathing, is alone at night  PATIENT GOALS: walk, have less pain, transfer without difficulty  NEXT MD VISIT: none scheduled  OBJECTIVE:   DIAGNOSTIC FINDINGS: none performed  COGNITION: Overall cognitive status: Within functional limits for tasks assessed     SENSATION: WFL POSTURE: rounded shoulders and forward head  PALPATION: Left knee and ankle are swollen and tender, left upper and neck  LOWER EXTREMITY ROM:  Active ROM Right eval Left eval  Hip flexion    Hip extension    Hip abduction    Hip adduction    Hip  internal rotation    Hip external rotation    Knee flexion  90  Knee extension  0  Ankle dorsiflexion  5  Ankle plantarflexion  30  Ankle inversion  5  Ankle eversion  0   (Blank rows = not tested)  LOWER EXTREMITY MMT:  MMT Right eval Left eval  Hip flexion 4- 3+  Hip extension    Hip abduction 4 3+  Hip adduction 4 3+  Hip internal rotation    Hip external rotation    Knee flexion 4- 3+  Knee extension 4- 3+  Ankle dorsiflexion    Ankle plantarflexion    Ankle inversion    Ankle eversion     (Blank rows = not tested)  FUNCTIONAL TESTS:  Transfers Max A, left knee gives out and goes into ER and tends to bow into varus, has not walked in about 18 months, and the last time I saw her we could walk about 60 feet with HHA and w/c following  GAIT: Distance walked: unable   TODAY'S TREATMENT:  DATE:  05/22/23 Nustep level 4 x 5 minutes    PATIENT EDUCATION:  Education details: POC Person educated: Patient Education method: Explanation Education comprehension: verbalized understanding  HOME EXERCISE PROGRAM: TBD  ASSESSMENT:  CLINICAL IMPRESSION: Patient is a 82 y.o. female who was seen today for physical therapy evaluation and treatment for debility, with recent Covid and ORIF hardware removal of the ankle.  Due to the issues with the ankle she has not been able to walk or transfer or exercise due to the open wound.  She got covid recently and reports that she really has not been able to do much, I have seen her off and on over the past 20+ years and she is having a lot of difficulty controlling the left knee, it buckles and goes into varus, she has been transferring with a large boot on and I think it allowed her to really not use the mms of the knee due to its stability, we went to a tennis shoe with a smaller brace.  The knee was really unstable  with this  OBJECTIVE IMPAIRMENTS: Abnormal gait, cardiopulmonary status limiting activity, decreased activity tolerance, decreased balance, decreased coordination, decreased endurance, decreased mobility, difficulty walking, decreased ROM, decreased strength, increased edema, impaired flexibility, improper body mechanics, postural dysfunction, and pain.   REHAB POTENTIAL: Good  CLINICAL DECISION MAKING: Stable/uncomplicated  EVALUATION COMPLEXITY: Low   GOALS: Goals reviewed with patient? Yes  SHORT TERM GOALS: Target date: 06/02/23 Independent with advanced HEP Goal status: INITIAL  LONG TERM GOALS: Target date: 08/21/23  Independent with advanced HEP with caregiver Goal status: INITIAL  2.  Transfer with set up and CGA Goal status: INITIAL  3.  Walk HHA x 100 feet Goal status: INITIAL  4.  Increase left LE strength to 4/5 Goal status: INITIAL  5.  Report neck and shoulder pain decreased 50% Goal status: INITIAL  PLAN:  PT FREQUENCY: 1-2x/week  PT DURATION: 12 weeks  PLANNED INTERVENTIONS: Therapeutic exercises, Therapeutic activity, Neuromuscular re-education, Balance training, Gait training, Patient/Family education, Self Care, Joint mobilization, Moist heat, Taping, and Manual therapy  PLAN FOR NEXT SESSION: advance activities really work on stability of the left knee   Sean Malinowski W, PT 05/22/2023, 11:02 AM

## 2023-05-24 ENCOUNTER — Ambulatory Visit: Payer: Medicare Other | Admitting: Physical Therapy

## 2023-05-24 ENCOUNTER — Encounter: Payer: Self-pay | Admitting: Physical Therapy

## 2023-05-24 DIAGNOSIS — R309 Painful micturition, unspecified: Secondary | ICD-10-CM | POA: Diagnosis not present

## 2023-05-24 DIAGNOSIS — I6990 Unspecified sequelae of unspecified cerebrovascular disease: Secondary | ICD-10-CM | POA: Diagnosis not present

## 2023-05-24 DIAGNOSIS — M6281 Muscle weakness (generalized): Secondary | ICD-10-CM | POA: Diagnosis not present

## 2023-05-24 DIAGNOSIS — I69354 Hemiplegia and hemiparesis following cerebral infarction affecting left non-dominant side: Secondary | ICD-10-CM

## 2023-05-24 DIAGNOSIS — M25572 Pain in left ankle and joints of left foot: Secondary | ICD-10-CM

## 2023-05-24 DIAGNOSIS — R262 Difficulty in walking, not elsewhere classified: Secondary | ICD-10-CM

## 2023-05-24 DIAGNOSIS — R82998 Other abnormal findings in urine: Secondary | ICD-10-CM | POA: Diagnosis not present

## 2023-05-24 DIAGNOSIS — R270 Ataxia, unspecified: Secondary | ICD-10-CM

## 2023-05-24 DIAGNOSIS — R293 Abnormal posture: Secondary | ICD-10-CM | POA: Diagnosis not present

## 2023-05-24 DIAGNOSIS — M542 Cervicalgia: Secondary | ICD-10-CM

## 2023-05-24 DIAGNOSIS — M25512 Pain in left shoulder: Secondary | ICD-10-CM

## 2023-05-24 DIAGNOSIS — I6930 Unspecified sequelae of cerebral infarction: Secondary | ICD-10-CM

## 2023-05-24 NOTE — Therapy (Signed)
OUTPATIENT PHYSICAL THERAPY LOWER EXTREMITY TREATMENT   Patient Name: Karen Jackson MRN: [id number] DOB:1940-12-07, 82 y.o., female Today's Date: 05/24/2023  END OF SESSION:  PT End of Session - 05/24/23 1107     Visit Number 2    Date for PT Re-Evaluation 08/21/23    Authorization Type Medicare    PT Start Time 1100    PT Stop Time 1145    PT Time Calculation (min) 45 min    Activity Tolerance Patient tolerated treatment well;Patient limited by pain    Behavior During Therapy [facility] for tasks assessed/performed             Past Medical History:  Diagnosis Date   Acute cystitis without hematuria    Acute diastolic CHF (congestive heart failure) (HCC)    Arthritis    Dyspnea    Dysrhythmia    Fever of unknown origin 03/19/2017   Hyperlipidemia    Hypertension    denies at preop   Multifocal pneumonia    Neuromuscular disorder (HCC)    neuropathy left arm and foot   Osteopenia    Paralysis (HCC)    partial left side from CVA    Persistent atrial fibrillation (HCC)    PONV (postoperative nausea and vomiting)    Pre-diabetes    Stroke [facility]) 2013   hemmorahgic   Past Surgical History:  Procedure Laterality Date   ANKLE SURGERY     APPENDECTOMY     CHOLECYSTECTOMY     HARDWARE REMOVAL Left 03/29/2023   Procedure: HARDWARE REMOVAL;  Surgeon: Toni Arthurs, MD;  Location: [facility];  Service: Orthopedics;  Laterality: Left;   HERNIA REPAIR     Esophagus   INCISION AND DRAINAGE OF WOUND Left 03/29/2023   Procedure: LEFT ANKLE WOUND IRRIGATION AND DEBRIDEMENT WOUND;  Surgeon: Toni Arthurs, MD;  Location:  [facility]ERY [facility];  Service: Orthopedics;  Laterality: Left;   JOINT REPLACEMENT     total- right partial- left   MASTECTOMY PARTIAL / LUMPECTOMY  2012   left   ORIF ANKLE FRACTURE Left 07/20/2018   Procedure: OPEN REDUCTION INTERNAL FIXATION (ORIF) ANKLE FRACTURE;  Surgeon: Toni Arthurs, MD;  Location: [facility];  Service: Orthopedics;  Laterality:  Left;   TOTAL KNEE ARTHROPLASTY Left 01/27/2019   Procedure: TOTAL KNEE ARTHROPLASTY;  Surgeon: Dannielle Huh, MD;  Location: [facility];  Service: Orthopedics;  Laterality: Left;   Patient Active Problem List   Diagnosis Date Noted   Goals of care, counseling/discussion 01/17/2022   Grief 01/17/2022   Secondary hypercoagulable state (HCC) 03/16/2021   Dizziness 03/10/2021   Orthostatic hypotension 10/15/2020   Presbycusis of both ears 03/08/2020   Mixed stress and urge urinary incontinence 12/02/2019   Macrocytosis 12/01/2019   Nutritional anemia 12/01/2019   S/P total knee replacement 01/27/2019   Recurrent left knee instability 07/05/2018   Respiratory failure with hypoxia (HCC) 08/30/2017   Hypoxemia    Heart failure with preserved ejection fraction (HCC), Grade 3 diastolic dysfunction 03/26/2017   PAF (paroxysmal atrial fibrillation) (HCC)    Dyspnea 03/19/2017   Encounter for preventive health examination 02/17/2016   Sensorineural hearing loss (SNHL), bilateral 01/26/2016   Hypomagnesemia 04/24/2014   Hemiparesis affecting left side as late effect of cerebrovascular accident (HCC) 04/24/2014   Nontraumatic cerebral hemorrhage (HCC) 04/30/2012   DM (diabetes mellitus) with complications (HCC) 03/04/2010   OSTEOPENIA 01/21/2009   UNSPECIFIED VITAMIN D DEFICIENCY 11/19/2007   HYPERCHOLESTEROLEMIA 10/25/2006   GASTROESOPHAGEAL REFLUX, NO ESOPHAGITIS 10/25/2006  DIVERTICULOSIS OF COLON 10/25/2006   Osteoarthritis 10/25/2006   CERVICAL SPINE DISORDER, NOS 10/25/2006    PCP: Waynard Edwards, MD  REFERRING PROVIDER: Waynard Edwards, MD  REFERRING DIAG: s/p left ankle ORIF hardware removal  THERAPY DIAG:  Difficulty in walking, not elsewhere classified  Muscle weakness (generalized)  Hemiplegia and hemiparesis following cerebral infarction affecting left non-dominant side (HCC)  Ataxia  Late effects of CVA (cerebrovascular accident)  Abnormal posture  Cervicalgia  Pain in left ankle  and joints of left foot  Left shoulder pain, unspecified chronicity  Rationale for Evaluation and Treatment: Rehabilitation  ONSET DATE: 03/29/23  SUBJECTIVE:   SUBJECTIVE STATEMENT: Patient underwent left ankle ORIF hardware removal on 03/29/23 due to infection.  She reports that she also was having issues with wounds on the ankle, this is better now.  Reports that she has been doing well with the shoe and transfers with the brace, also got a knee brace for some added stability  PERTINENT HISTORY: CVA, CHF, HTN, A-fib, TKA PAIN:  Are you having pain? Yes: NPRS scale: 7/10 Pain location: left upper trap and neck, left ankle and knee  Pain description: ache sore Aggravating factors: neck and shoulder always hurt, knee and ankle hurt with transfers Relieving factors: massage, heat  PRECAUTIONS: None  RED FLAGS: None   WEIGHT BEARING RESTRICTIONS: No  FALLS:  Has patient fallen in last 6 months? No  LIVING ENVIRONMENT: Lives with: lives alone Lives in: House/apartment Stairs: No Has following equipment at home: Environmental consultant - 2 wheeled, Wheelchair (manual), shower chair, bed side commode, Grab bars, and Ramped entry  OCCUPATION: retired  PLOF: Needs assistance with homemaking, Needs assistance with transfers, and Leisure: plays bridge, she has 11 hours of an aide at home that helps with meals, going to MD's, dressing and bathing, is alone at night  PATIENT GOALS: walk, have less pain, transfer without difficulty  NEXT MD VISIT: none scheduled  OBJECTIVE:   DIAGNOSTIC FINDINGS: none performed  COGNITION: Overall cognitive status: Within functional limits for tasks assessed     SENSATION: WFL POSTURE: rounded shoulders and forward head  PALPATION: Left knee and ankle are swollen and tender, left upper and neck  LOWER EXTREMITY ROM:  Active ROM Right eval Left eval  Hip flexion    Hip extension    Hip abduction    Hip adduction    Hip internal rotation    Hip  external rotation    Knee flexion  90  Knee extension  0  Ankle dorsiflexion  5  Ankle plantarflexion  30  Ankle inversion  5  Ankle eversion  0   (Blank rows = not tested)  LOWER EXTREMITY MMT:  MMT Right eval Left eval  Hip flexion 4- 3+  Hip extension    Hip abduction 4 3+  Hip adduction 4 3+  Hip internal rotation    Hip external rotation    Knee flexion 4- 3+  Knee extension 4- 3+  Ankle dorsiflexion    Ankle plantarflexion    Ankle inversion    Ankle eversion     (Blank rows = not tested)  FUNCTIONAL TESTS:  Transfers Max A, left knee gives out and goes into ER and tends to bow into varus, has not walked in about 18 months, and the last time I saw her we could walk about 60 feet with HHA and w/c following  GAIT: Distance walked: unable   TODAY'S TREATMENT:  DATE:  05/24/23 Gentle PROM of the hand and shoulder on the left STM to the left upper trap Nustep level 4 x 7 minutes 2# hip flexion 2" LAQ Red tband HS curls Red tband ankle PF/DF Red tband hip adduction Gait with WC follow 15' x 2 with HHA with knee brace, ankle brace and shoe  05/22/23 Nustep level 4 x 5 minutes    PATIENT EDUCATION:  Education details: POC Person educated: Patient Education method: Explanation Education comprehension: verbalized understanding  HOME EXERCISE PROGRAM: TBD  ASSESSMENT:  CLINICAL IMPRESSION: Patient is a 82 y.o. female who was seen today for physical therapy evaluation and treatment for debility, with recent Covid and ORIF hardware removal of the ankle.  Started exercises with strength focus on the knee and ankle really needing her to control the knee being held in, also started walking with HHA, she did well  OBJECTIVE IMPAIRMENTS: Abnormal gait, cardiopulmonary status limiting activity, decreased activity tolerance, decreased balance,  decreased coordination, decreased endurance, decreased mobility, difficulty walking, decreased ROM, decreased strength, increased edema, impaired flexibility, improper body mechanics, postural dysfunction, and pain.   REHAB POTENTIAL: Good  CLINICAL DECISION MAKING: Stable/uncomplicated  EVALUATION COMPLEXITY: Low   GOALS: Goals reviewed with patient? Yes  SHORT TERM GOALS: Target date: 06/02/23 Independent with advanced HEP Goal status: INITIAL  LONG TERM GOALS: Target date: 08/21/23  Independent with advanced HEP with caregiver Goal status: INITIAL  2.  Transfer with set up and CGA Goal status: INITIAL  3.  Walk HHA x 100 feet Goal status: INITIAL  4.  Increase left LE strength to 4/5 Goal status: INITIAL  5.  Report neck and shoulder pain decreased 50% Goal status: INITIAL  PLAN:  PT FREQUENCY: 1-2x/week  PT DURATION: 12 weeks  PLANNED INTERVENTIONS: Therapeutic exercises, Therapeutic activity, Neuromuscular re-education, Balance training, Gait training, Patient/Family education, Self Care, Joint mobilization, Moist heat, Taping, and Manual therapy  PLAN FOR NEXT SESSION: advance activities really work on stability of the left knee   Crist Kruszka W, PT 05/24/2023, 11:08 AM

## 2023-05-29 ENCOUNTER — Ambulatory Visit: Payer: Medicare Other | Attending: Internal Medicine | Admitting: Physical Therapy

## 2023-05-29 ENCOUNTER — Encounter: Payer: Self-pay | Admitting: Physical Therapy

## 2023-05-29 DIAGNOSIS — M25572 Pain in left ankle and joints of left foot: Secondary | ICD-10-CM | POA: Insufficient documentation

## 2023-05-29 DIAGNOSIS — I6990 Unspecified sequelae of unspecified cerebrovascular disease: Secondary | ICD-10-CM | POA: Insufficient documentation

## 2023-05-29 DIAGNOSIS — R262 Difficulty in walking, not elsewhere classified: Secondary | ICD-10-CM | POA: Insufficient documentation

## 2023-05-29 DIAGNOSIS — I69354 Hemiplegia and hemiparesis following cerebral infarction affecting left non-dominant side: Secondary | ICD-10-CM | POA: Insufficient documentation

## 2023-05-29 DIAGNOSIS — M6281 Muscle weakness (generalized): Secondary | ICD-10-CM | POA: Diagnosis not present

## 2023-05-29 DIAGNOSIS — R270 Ataxia, unspecified: Secondary | ICD-10-CM | POA: Diagnosis not present

## 2023-05-29 DIAGNOSIS — M542 Cervicalgia: Secondary | ICD-10-CM | POA: Diagnosis not present

## 2023-05-29 DIAGNOSIS — M25512 Pain in left shoulder: Secondary | ICD-10-CM | POA: Diagnosis not present

## 2023-05-29 NOTE — Therapy (Signed)
OUTPATIENT PHYSICAL THERAPY LOWER EXTREMITY TREATMENT   Patient Name: Karen Jackson MRN: [id number] DOB:02-08-1941, 82 y.o., female Today's Date: 05/29/2023  END OF SESSION:  PT End of Session - 05/29/23 1110     Visit Number 3    Date for PT Re-Evaluation 08/21/23    Authorization Type Medicare    PT Start Time 1057    PT Stop Time 1145    PT Time Calculation (min) 48 min    Activity Tolerance Patient tolerated treatment well;Patient limited by pain    Behavior During Therapy [facility] for tasks assessed/performed             Past Medical History:  Diagnosis Date   Acute cystitis without hematuria    Acute diastolic CHF (congestive heart failure) (HCC)    Arthritis    Dyspnea    Dysrhythmia    Fever of unknown origin 03/19/2017   Hyperlipidemia    Hypertension    denies at preop   Multifocal pneumonia    Neuromuscular disorder (HCC)    neuropathy left arm and foot   Osteopenia    Paralysis (HCC)    partial left side from CVA    Persistent atrial fibrillation (HCC)    PONV (postoperative nausea and vomiting)    Pre-diabetes    Stroke [facility] 2013   hemmorahgic   Past Surgical History:  Procedure Laterality Date   ANKLE SURGERY     APPENDECTOMY     CHOLECYSTECTOMY     HARDWARE REMOVAL Left 03/29/2023   Procedure: HARDWARE REMOVAL;  Surgeon: Toni Arthurs, MD;  Location: [facility];  Service: Orthopedics;  Laterality: Left;   HERNIA REPAIR     Esophagus   INCISION AND DRAINAGE OF WOUND Left 03/29/2023   Procedure: LEFT ANKLE WOUND IRRIGATION AND DEBRIDEMENT WOUND;  Surgeon: Toni Arthurs, MD;  Location: [facility];  Service: Orthopedics;  Laterality: Left;   JOINT REPLACEMENT     total- right partial- left   MASTECTOMY PARTIAL / LUMPECTOMY  2012   left   ORIF ANKLE FRACTURE Left 07/20/2018   Procedure: OPEN REDUCTION INTERNAL FIXATION (ORIF) ANKLE FRACTURE;  Surgeon: Toni Arthurs, MD;  Location: [facility];  Service: Orthopedics;  Laterality:  Left;   TOTAL KNEE ARTHROPLASTY Left 01/27/2019   Procedure: TOTAL KNEE ARTHROPLASTY;  Surgeon: Dannielle Huh, MD;  Location: [facility];  Service: Orthopedics;  Laterality: Left;   Patient Active Problem List   Diagnosis Date Noted   Goals of care, counseling/discussion 01/17/2022   Grief 01/17/2022   Secondary hypercoagulable state (HCC) 03/16/2021   Dizziness 03/10/2021   Orthostatic hypotension 10/15/2020   Presbycusis of both ears 03/08/2020   Mixed stress and urge urinary incontinence 12/02/2019   Macrocytosis 12/01/2019   Nutritional anemia 12/01/2019   S/P total knee replacement 01/27/2019   Recurrent left knee instability 07/05/2018   Respiratory failure with hypoxia (HCC) 08/30/2017   Hypoxemia    Heart failure with preserved ejection fraction (HCC), Grade 3 diastolic dysfunction 03/26/2017   PAF (paroxysmal atrial fibrillation) (HCC)    Dyspnea 03/19/2017   Encounter for preventive health examination 02/17/2016   Sensorineural hearing loss (SNHL), bilateral 01/26/2016   Hypomagnesemia 04/24/2014   Hemiparesis affecting left side as late effect of cerebrovascular accident (HCC) 04/24/2014   Nontraumatic cerebral hemorrhage (HCC) 04/30/2012   DM (diabetes mellitus) with complications (HCC) 03/04/2010   OSTEOPENIA 01/21/2009   UNSPECIFIED VITAMIN D DEFICIENCY 11/19/2007   HYPERCHOLESTEROLEMIA 10/25/2006   GASTROESOPHAGEAL REFLUX, NO ESOPHAGITIS 10/25/2006  DIVERTICULOSIS OF COLON 10/25/2006   Osteoarthritis 10/25/2006   CERVICAL SPINE DISORDER, NOS 10/25/2006    PCP: Waynard Edwards, MD  REFERRING PROVIDER: Waynard Edwards, MD  REFERRING DIAG: s/p left ankle ORIF hardware removal  THERAPY DIAG:  Difficulty in walking, not elsewhere classified  Muscle weakness (generalized)  Hemiplegia and hemiparesis following cerebral infarction affecting left non-dominant side (HCC)  Ataxia  Late effects of CVA (cerebrovascular accident)  Pain in left ankle and joints of left  foot  Cervicalgia  Left shoulder pain, unspecified chronicity  Rationale for Evaluation and Treatment: Rehabilitation  ONSET DATE: 03/29/23  SUBJECTIVE:   SUBJECTIVE STATEMENT: Patient underwent left ankle ORIF hardware removal on 03/29/23 due to infection.  Patient reports that she did well over the weekend with transfers but today she really is struggling with this, reports that she is having trouble with the left leg  PERTINENT HISTORY: CVA, CHF, HTN, A-fib, TKA PAIN:  Are you having pain? Yes: NPRS scale: 7/10 Pain location: left upper trap and neck, left ankle and knee  Pain description: ache sore Aggravating factors: neck and shoulder always hurt, knee and ankle hurt with transfers Relieving factors: massage, heat  PRECAUTIONS: None  RED FLAGS: None   WEIGHT BEARING RESTRICTIONS: No  FALLS:  Has patient fallen in last 6 months? No  LIVING ENVIRONMENT: Lives with: lives alone Lives in: House/apartment Stairs: No Has following equipment at home: Environmental consultant - 2 wheeled, Wheelchair (manual), shower chair, bed side commode, Grab bars, and Ramped entry  OCCUPATION: retired  PLOF: Needs assistance with homemaking, Needs assistance with transfers, and Leisure: plays bridge, she has 11 hours of an aide at home that helps with meals, going to MD's, dressing and bathing, is alone at night  PATIENT GOALS: walk, have less pain, transfer without difficulty  NEXT MD VISIT: none scheduled  OBJECTIVE:   DIAGNOSTIC FINDINGS: none performed  COGNITION: Overall cognitive status: Within functional limits for tasks assessed     SENSATION: WFL POSTURE: rounded shoulders and forward head  PALPATION: Left knee and ankle are swollen and tender, left upper and neck  LOWER EXTREMITY ROM:  Active ROM Right eval Left eval  Hip flexion    Hip extension    Hip abduction    Hip adduction    Hip internal rotation    Hip external rotation    Knee flexion  90  Knee extension  0   Ankle dorsiflexion  5  Ankle plantarflexion  30  Ankle inversion  5  Ankle eversion  0   (Blank rows = not tested)  LOWER EXTREMITY MMT:  MMT Right eval Left eval  Hip flexion 4- 3+  Hip extension    Hip abduction 4 3+  Hip adduction 4 3+  Hip internal rotation    Hip external rotation    Knee flexion 4- 3+  Knee extension 4- 3+  Ankle dorsiflexion    Ankle plantarflexion    Ankle inversion    Ankle eversion     (Blank rows = not tested)  FUNCTIONAL TESTS:  Transfers Max A, left knee gives out and goes into ER and tends to bow into varus, has not walked in about 18 months, and the last time I saw her we could walk about 60 feet with HHA and w/c following  GAIT: Distance walked: unable   TODAY'S TREATMENT:  DATE:  05/29/23 Nustep level 5 x 6 minutes STM to the left upper trap Transfers were much more difficult today with left leg not working well snapping into extension 2.5# LAQ, marches Red tband HS curls Red tband ankle PF/DF Red tband left hip adduction Standing weight shifts Walk HHA attempted but unable c/o left knee pain and was not putting weight Push on ball with the left foot 2 x 10 with good resistance Walk with HHA again this time able to go 15 feet with some left knee pain at the end, still not wanting to put weight on the left and if she does she snaps the knee back  05/24/23 Gentle PROM of the hand and shoulder on the left STM to the left upper trap Nustep level 4 x 7 minutes 2# hip flexion 2" LAQ Red tband HS curls Red tband ankle PF/DF Red tband hip adduction Gait with WC follow 15' x 2 with HHA with knee brace, ankle brace and shoe  05/22/23 Nustep level 4 x 5 minutes    PATIENT EDUCATION:  Education details: POC Person educated: Patient Education method: Explanation Education comprehension: verbalized  understanding  HOME EXERCISE PROGRAM: TBD  ASSESSMENT:  CLINICAL IMPRESSION: Patient is a 82 y.o. female who was seen today for physical therapy evaluation and treatment for debility, with recent Covid and ORIF hardware removal of the ankle.  Patient really struggling today with the left leg, difficulty putting it on the foot rest, snaps the knee back into extension and has the left hip pulled back, She had some c/o pain  with weight bearing in the left knee the first attempt at walking, second attempt went better but still not as good as last week and then transfer back into the car was very difficult.  OBJECTIVE IMPAIRMENTS: Abnormal gait, cardiopulmonary status limiting activity, decreased activity tolerance, decreased balance, decreased coordination, decreased endurance, decreased mobility, difficulty walking, decreased ROM, decreased strength, increased edema, impaired flexibility, improper body mechanics, postural dysfunction, and pain.   REHAB POTENTIAL: Good  CLINICAL DECISION MAKING: Stable/uncomplicated  EVALUATION COMPLEXITY: Low   GOALS: Goals reviewed with patient? Yes  SHORT TERM GOALS: Target date: 06/02/23 Independent with advanced HEP Goal status: INITIAL  LONG TERM GOALS: Target date: 08/21/23  Independent with advanced HEP with caregiver Goal status: INITIAL  2.  Transfer with set up and CGA Goal status: INITIAL  3.  Walk HHA x 100 feet Goal status: INITIAL  4.  Increase left LE strength to 4/5 Goal status: INITIAL  5.  Report neck and shoulder pain decreased 50% Goal status: INITIAL  PLAN:  PT FREQUENCY: 1-2x/week  PT DURATION: 12 weeks  PLANNED INTERVENTIONS: Therapeutic exercises, Therapeutic activity, Neuromuscular re-education, Balance training, Gait training, Patient/Family education, Self Care, Joint mobilization, Moist heat, Taping, and Manual therapy  PLAN FOR NEXT SESSION: advance activities really work on stability of the left  knee   Shekia Kuper W, PT 05/29/2023, 11:11 AM

## 2023-05-31 ENCOUNTER — Encounter: Payer: Self-pay | Admitting: Physical Therapy

## 2023-05-31 ENCOUNTER — Ambulatory Visit: Payer: Medicare Other | Admitting: Physical Therapy

## 2023-05-31 DIAGNOSIS — M25572 Pain in left ankle and joints of left foot: Secondary | ICD-10-CM | POA: Diagnosis not present

## 2023-05-31 DIAGNOSIS — R270 Ataxia, unspecified: Secondary | ICD-10-CM | POA: Diagnosis not present

## 2023-05-31 DIAGNOSIS — M542 Cervicalgia: Secondary | ICD-10-CM

## 2023-05-31 DIAGNOSIS — M6281 Muscle weakness (generalized): Secondary | ICD-10-CM

## 2023-05-31 DIAGNOSIS — R262 Difficulty in walking, not elsewhere classified: Secondary | ICD-10-CM

## 2023-05-31 DIAGNOSIS — I6990 Unspecified sequelae of unspecified cerebrovascular disease: Secondary | ICD-10-CM

## 2023-05-31 DIAGNOSIS — M25512 Pain in left shoulder: Secondary | ICD-10-CM

## 2023-05-31 DIAGNOSIS — I69354 Hemiplegia and hemiparesis following cerebral infarction affecting left non-dominant side: Secondary | ICD-10-CM

## 2023-05-31 DIAGNOSIS — I6930 Unspecified sequelae of cerebral infarction: Secondary | ICD-10-CM

## 2023-05-31 NOTE — Therapy (Signed)
OUTPATIENT PHYSICAL THERAPY LOWER EXTREMITY TREATMENT   Patient Name: Karen Jackson MRN: [id number] DOB:1941-01-29, 82 y.o., female Today's Date: 05/31/2023  END OF SESSION:  PT End of Session - 05/31/23 1103     Visit Number 4    Date for PT Re-Evaluation 08/21/23    Authorization Type Medicare    PT Start Time 1057    PT Stop Time 1145    PT Time Calculation (min) 48 min    Activity Tolerance Patient tolerated treatment well    Behavior During Therapy [facility] for tasks assessed/performed             Past Medical History:  Diagnosis Date   Acute cystitis without hematuria    Acute diastolic CHF (congestive heart failure) (HCC)    Arthritis    Dyspnea    Dysrhythmia    Fever of unknown origin 03/19/2017   Hyperlipidemia    Hypertension    denies at preop   Multifocal pneumonia    Neuromuscular disorder (HCC)    neuropathy left arm and foot   Osteopenia    Paralysis (HCC)    partial left side from CVA    Persistent atrial fibrillation (HCC)    PONV (postoperative nausea and vomiting)    Pre-diabetes    Stroke [facility]) 2013   hemmorahgic   Past Surgical History:  Procedure Laterality Date   ANKLE SURGERY     APPENDECTOMY     CHOLECYSTECTOMY     HARDWARE REMOVAL Left 03/29/2023   Procedure: HARDWARE REMOVAL;  Surgeon: Toni Arthurs, MD;  Location: [facility];  Service: Orthopedics;  Laterality: Left;   HERNIA REPAIR     Esophagus   INCISION AND DRAINAGE OF WOUND Left 03/29/2023   Procedure: LEFT ANKLE WOUND IRRIGATION AND DEBRIDEMENT WOUND;  Surgeon: Toni Arthurs, MD;  Location:  [facility];  Service: Orthopedics;  Laterality: Left;   JOINT REPLACEMENT     total- right partial- left   MASTECTOMY PARTIAL / LUMPECTOMY  2012   left   ORIF ANKLE FRACTURE Left 07/20/2018   Procedure: OPEN REDUCTION INTERNAL FIXATION (ORIF) ANKLE FRACTURE;  Surgeon: Toni Arthurs, MD;  Location: [facility];  Service: Orthopedics;  Laterality: Left;   TOTAL KNEE  ARTHROPLASTY Left 01/27/2019   Procedure: TOTAL KNEE ARTHROPLASTY;  Surgeon: Dannielle Huh, MD;  Location: [facility];  Service: Orthopedics;  Laterality: Left;   Patient Active Problem List   Diagnosis Date Noted   Goals of care, counseling/discussion 01/17/2022   Grief 01/17/2022   Secondary hypercoagulable state (HCC) 03/16/2021   Dizziness 03/10/2021   Orthostatic hypotension 10/15/2020   Presbycusis of both ears 03/08/2020   Mixed stress and urge urinary incontinence 12/02/2019   Macrocytosis 12/01/2019   Nutritional anemia 12/01/2019   S/P total knee replacement 01/27/2019   Recurrent left knee instability 07/05/2018   Respiratory failure with hypoxia (HCC) 08/30/2017   Hypoxemia    Heart failure with preserved ejection fraction (HCC), Grade 3 diastolic dysfunction 03/26/2017   PAF (paroxysmal atrial fibrillation) (HCC)    Dyspnea 03/19/2017   Encounter for preventive health examination 02/17/2016   Sensorineural hearing loss (SNHL), bilateral 01/26/2016   Hypomagnesemia 04/24/2014   Hemiparesis affecting left side as late effect of cerebrovascular accident (HCC) 04/24/2014   Nontraumatic cerebral hemorrhage (HCC) 04/30/2012   DM (diabetes mellitus) with complications (HCC) 03/04/2010   OSTEOPENIA 01/21/2009   UNSPECIFIED VITAMIN D DEFICIENCY 11/19/2007   HYPERCHOLESTEROLEMIA 10/25/2006   GASTROESOPHAGEAL REFLUX, NO ESOPHAGITIS 10/25/2006   DIVERTICULOSIS OF COLON  10/25/2006   Osteoarthritis 10/25/2006   CERVICAL SPINE DISORDER, NOS 10/25/2006    PCP: Waynard Edwards, MD  REFERRING PROVIDER: Waynard Edwards, MD  REFERRING DIAG: s/p left ankle ORIF hardware removal  THERAPY DIAG:  Difficulty in walking, not elsewhere classified  Muscle weakness (generalized)  Hemiplegia and hemiparesis following cerebral infarction affecting left non-dominant side (HCC)  Ataxia  Pain in left ankle and joints of left foot  Late effects of CVA (cerebrovascular accident)  Cervicalgia  Left shoulder  pain, unspecified chronicity  Rationale for Evaluation and Treatment: Rehabilitation  ONSET DATE: 03/29/23  SUBJECTIVE:   SUBJECTIVE STATEMENT: Patient underwent left ankle ORIF hardware removal on 03/29/23 due to infection.  Patient reports that she got stuck yesterday, could not transfer off the toilet, said her left leg just would go out, she reports that she was wearing a knee brace and the ankle brace.  Reports really had to have max A yesterday for any transfers  PERTINENT HISTORY: CVA, CHF, HTN, A-fib, TKA PAIN:  Are you having pain? Yes: NPRS scale: 5/10 Pain location: left upper trap and neck, left ankle and knee  Pain description: ache sore Aggravating factors: neck and shoulder always hurt, knee and ankle hurt with transfers Relieving factors: massage, heat  PRECAUTIONS: None  RED FLAGS: None   WEIGHT BEARING RESTRICTIONS: No  FALLS:  Has patient fallen in last 6 months? No  LIVING ENVIRONMENT: Lives with: lives alone Lives in: House/apartment Stairs: No Has following equipment at home: Environmental consultant - 2 wheeled, Wheelchair (manual), shower chair, bed side commode, Grab bars, and Ramped entry  OCCUPATION: retired  PLOF: Needs assistance with homemaking, Needs assistance with transfers, and Leisure: plays bridge, she has 11 hours of an aide at home that helps with meals, going to MD's, dressing and bathing, is alone at night  PATIENT GOALS: walk, have less pain, transfer without difficulty  NEXT MD VISIT: none scheduled  OBJECTIVE:   DIAGNOSTIC FINDINGS: none performed  COGNITION: Overall cognitive status: Within functional limits for tasks assessed     SENSATION: WFL POSTURE: rounded shoulders and forward head  PALPATION: Left knee and ankle are swollen and tender, left upper and neck  LOWER EXTREMITY ROM:  Active ROM Right eval Left eval  Hip flexion    Hip extension    Hip abduction    Hip adduction    Hip internal rotation    Hip external  rotation    Knee flexion  90  Knee extension  0  Ankle dorsiflexion  5  Ankle plantarflexion  30  Ankle inversion  5  Ankle eversion  0   (Blank rows = not tested)  LOWER EXTREMITY MMT:  MMT Right eval Left eval  Hip flexion 4- 3+  Hip extension    Hip abduction 4 3+  Hip adduction 4 3+  Hip internal rotation    Hip external rotation    Knee flexion 4- 3+  Knee extension 4- 3+  Ankle dorsiflexion    Ankle plantarflexion    Ankle inversion    Ankle eversion     (Blank rows = not tested)  FUNCTIONAL TESTS:  Transfers Max A, left knee gives out and goes into ER and tends to bow into varus, has not walked in about 18 months, and the last time I saw her we could walk about 60 feet with HHA and w/c following  GAIT: Distance walked: unable   TODAY'S TREATMENT:  DATE:  05/31/23 Transfers really having the left ankle roll, and the left knee turn out, Max A required Nustep level 5 x 7 minutes In pbars practiced standing and worked on weight shifts, keeping left ankle and left knee in, did some right leg marching trying to get weight transfer on the left and hold knee wihtout hyper extension and the hip going back and left ankle from rolling, worked on hips forward and chest and head up Left ankle red tband exercises Left knee TKE in sitting with foot propped red tband Left hip adduction red tband 5 x 10 really focus on consistency   05/29/23 Nustep level 5 x 6 minutes STM to the left upper trap Transfers were much more difficult today with left leg not working well snapping into extension 2.5# LAQ, marches Red tband HS curls Red tband ankle PF/DF Red tband left hip adduction Standing weight shifts Walk HHA attempted but unable c/o left knee pain and was not putting weight Push on ball with the left foot 2 x 10 with good resistance Walk with HHA again  this time able to go 15 feet with some left knee pain at the end, still not wanting to put weight on the left and if she does she snaps the knee back  05/24/23 Gentle PROM of the hand and shoulder on the left STM to the left upper trap Nustep level 4 x 7 minutes 2# hip flexion 2" LAQ Red tband HS curls Red tband ankle PF/DF Red tband hip adduction Gait with WC follow 15' x 2 with HHA with knee brace, ankle brace and shoe  05/22/23 Nustep level 4 x 5 minutes    PATIENT EDUCATION:  Education details: POC Person educated: Patient Education method: Explanation Education comprehension: verbalized understanding  HOME EXERCISE PROGRAM: TBD  ASSESSMENT:  CLINICAL IMPRESSION: Patient is a 82 y.o. female who was seen today for physical therapy evaluation and treatment for debility, with recent Covid and ORIF hardware removal of the ankle.  Patient continues to struggle with transfers and walking, much worse than the first day, After working on her body awareness she did a little better with the transfer and walking, however could not walk more than 10 feet.  I feel that due to the past 18 months of her issues she has not used the leg much and the weakness and more than likely left side neglect is causing much more issues now,   OBJECTIVE IMPAIRMENTS: Abnormal gait, cardiopulmonary status limiting activity, decreased activity tolerance, decreased balance, decreased coordination, decreased endurance, decreased mobility, difficulty walking, decreased ROM, decreased strength, increased edema, impaired flexibility, improper body mechanics, postural dysfunction, and pain.   REHAB POTENTIAL: Good  CLINICAL DECISION MAKING: Stable/uncomplicated  EVALUATION COMPLEXITY: Low   GOALS: Goals reviewed with patient? Yes  SHORT TERM GOALS: Target date: 06/02/23 Independent with advanced HEP Goal status: ongoing 05/31/23  LONG TERM GOALS: Target date: 08/21/23  Independent with advanced HEP with  caregiver Goal status: INITIAL  2.  Transfer with set up and CGA Goal status: INITIAL  3.  Walk HHA x 100 feet Goal status: INITIAL  4.  Increase left LE strength to 4/5 Goal status: INITIAL  5.  Report neck and shoulder pain decreased 50% Goal status: INITIAL  PLAN:  PT FREQUENCY: 1-2x/week  PT DURATION: 12 weeks  PLANNED INTERVENTIONS: Therapeutic exercises, Therapeutic activity, Neuromuscular re-education, Balance training, Gait training, Patient/Family education, Self Care, Joint mobilization, Moist heat, Taping, and Manual therapy  PLAN FOR NEXT SESSION: advance  activities really work on stability of the left knee and her body awareness   Jearld Lesch, PT 05/31/2023, 11:03 AM

## 2023-06-05 ENCOUNTER — Encounter: Payer: Self-pay | Admitting: Physical Therapy

## 2023-06-05 ENCOUNTER — Ambulatory Visit: Payer: Medicare Other | Admitting: Physical Therapy

## 2023-06-05 DIAGNOSIS — I6990 Unspecified sequelae of unspecified cerebrovascular disease: Secondary | ICD-10-CM

## 2023-06-05 DIAGNOSIS — R270 Ataxia, unspecified: Secondary | ICD-10-CM | POA: Diagnosis not present

## 2023-06-05 DIAGNOSIS — M6281 Muscle weakness (generalized): Secondary | ICD-10-CM

## 2023-06-05 DIAGNOSIS — I69354 Hemiplegia and hemiparesis following cerebral infarction affecting left non-dominant side: Secondary | ICD-10-CM

## 2023-06-05 DIAGNOSIS — M542 Cervicalgia: Secondary | ICD-10-CM

## 2023-06-05 DIAGNOSIS — M25572 Pain in left ankle and joints of left foot: Secondary | ICD-10-CM | POA: Diagnosis not present

## 2023-06-05 DIAGNOSIS — M25512 Pain in left shoulder: Secondary | ICD-10-CM

## 2023-06-05 DIAGNOSIS — R262 Difficulty in walking, not elsewhere classified: Secondary | ICD-10-CM | POA: Diagnosis not present

## 2023-06-05 NOTE — Therapy (Signed)
OUTPATIENT PHYSICAL THERAPY LOWER EXTREMITY TREATMENT   Patient Name: Karen Jackson MRN: [id number] DOB:Nov 24, 1940, 82 y.o., female Today's Date: 06/05/2023  END OF SESSION:  PT End of Session - 06/05/23 1112     Visit Number 5    Date for PT Re-Evaluation 08/21/23    Authorization Type Medicare    PT Start Time 1057    PT Stop Time 1145    PT Time Calculation (min) 48 min    Activity Tolerance Patient tolerated treatment well    Behavior During Therapy [facility] for tasks assessed/performed             Past Medical History:  Diagnosis Date   Acute cystitis without hematuria    Acute diastolic CHF (congestive heart failure) (HCC)    Arthritis    Dyspnea    Dysrhythmia    Fever of unknown origin 03/19/2017   Hyperlipidemia    Hypertension    denies at preop   Multifocal pneumonia    Neuromuscular disorder (HCC)    neuropathy left arm and foot   Osteopenia    Paralysis (HCC)    partial left side from CVA    Persistent atrial fibrillation (HCC)    PONV (postoperative nausea and vomiting)    Pre-diabetes    Stroke [facility]) 2013   hemmorahgic   Past Surgical History:  Procedure Laterality Date   ANKLE SURGERY     APPENDECTOMY     CHOLECYSTECTOMY     HARDWARE REMOVAL Left 03/29/2023   Procedure: HARDWARE REMOVAL;  Surgeon: Toni Arthurs, MD;  Location: [facility];  Service: Orthopedics;  Laterality: Left;   HERNIA REPAIR     Esophagus   INCISION AND DRAINAGE OF WOUND Left 03/29/2023   Procedure: LEFT ANKLE WOUND IRRIGATION AND DEBRIDEMENT WOUND;  Surgeon: Toni Arthurs, MD;  Location: [facility];  Service: Orthopedics;  Laterality: Left;   JOINT REPLACEMENT     total- right partial- left   MASTECTOMY PARTIAL / LUMPECTOMY  2012   left   ORIF ANKLE FRACTURE Left 07/20/2018   Procedure: OPEN REDUCTION INTERNAL FIXATION (ORIF) ANKLE FRACTURE;  Surgeon: Toni Arthurs, MD;  Location: [facility];  Service: Orthopedics;  Laterality: Left;   TOTAL KNEE  ARTHROPLASTY Left 01/27/2019   Procedure: TOTAL KNEE ARTHROPLASTY;  Surgeon: Dannielle Huh, MD;  Location: [facility];  Service: Orthopedics;  Laterality: Left;   Patient Active Problem List   Diagnosis Date Noted   Goals of care, counseling/discussion 01/17/2022   Grief 01/17/2022   Secondary hypercoagulable state (HCC) 03/16/2021   Dizziness 03/10/2021   Orthostatic hypotension 10/15/2020   Presbycusis of both ears 03/08/2020   Mixed stress and urge urinary incontinence 12/02/2019   Macrocytosis 12/01/2019   Nutritional anemia 12/01/2019   S/P total knee replacement 01/27/2019   Recurrent left knee instability 07/05/2018   Respiratory failure with hypoxia (HCC) 08/30/2017   Hypoxemia    Heart failure with preserved ejection fraction (HCC), Grade 3 diastolic dysfunction 03/26/2017   PAF (paroxysmal atrial fibrillation) (HCC)    Dyspnea 03/19/2017   Encounter for preventive health examination 02/17/2016   Sensorineural hearing loss (SNHL), bilateral 01/26/2016   Hypomagnesemia 04/24/2014   Hemiparesis affecting left side as late effect of cerebrovascular accident (HCC) 04/24/2014   Nontraumatic cerebral hemorrhage (HCC) 04/30/2012   DM (diabetes mellitus) with complications (HCC) 03/04/2010   OSTEOPENIA 01/21/2009   UNSPECIFIED VITAMIN D DEFICIENCY 11/19/2007   HYPERCHOLESTEROLEMIA 10/25/2006   GASTROESOPHAGEAL REFLUX, NO ESOPHAGITIS 10/25/2006   DIVERTICULOSIS OF COLON  10/25/2006   Osteoarthritis 10/25/2006   CERVICAL SPINE DISORDER, NOS 10/25/2006    PCP: Waynard Edwards, MD  REFERRING PROVIDER: Waynard Edwards, MD  REFERRING DIAG: s/p left ankle ORIF hardware removal  THERAPY DIAG:  Difficulty in walking, not elsewhere classified  Muscle weakness (generalized)  Hemiplegia and hemiparesis following cerebral infarction affecting left non-dominant side (HCC)  Ataxia  Pain in left ankle and joints of left foot  Late effects of CVA (cerebrovascular accident)  Cervicalgia  Left shoulder  pain, unspecified chronicity  Rationale for Evaluation and Treatment: Rehabilitation  ONSET DATE: 03/29/23  SUBJECTIVE:   SUBJECTIVE STATEMENT: Patient underwent left ankle ORIF hardware removal on 03/29/23 due to infection.  Patient reports that she transferred better over the weekend, not as much difficulty, still reports has diffiuculty backing up  PERTINENT HISTORY: CVA, CHF, HTN, A-fib, TKA PAIN:  Are you having pain? Yes: NPRS scale: 5/10 Pain location: left upper trap and neck, left ankle and knee  Pain description: ache sore Aggravating factors: neck and shoulder always hurt, knee and ankle hurt with transfers Relieving factors: massage, heat  PRECAUTIONS: None  RED FLAGS: None   WEIGHT BEARING RESTRICTIONS: No  FALLS:  Has patient fallen in last 6 months? No  LIVING ENVIRONMENT: Lives with: lives alone Lives in: House/apartment Stairs: No Has following equipment at home: Environmental consultant - 2 wheeled, Wheelchair (manual), shower chair, bed side commode, Grab bars, and Ramped entry  OCCUPATION: retired  PLOF: Needs assistance with homemaking, Needs assistance with transfers, and Leisure: plays bridge, she has 11 hours of an aide at home that helps with meals, going to MD's, dressing and bathing, is alone at night  PATIENT GOALS: walk, have less pain, transfer without difficulty  NEXT MD VISIT: none scheduled  OBJECTIVE:   DIAGNOSTIC FINDINGS: none performed  COGNITION: Overall cognitive status: Within functional limits for tasks assessed     SENSATION: WFL POSTURE: rounded shoulders and forward head  PALPATION: Left knee and ankle are swollen and tender, left upper and neck  LOWER EXTREMITY ROM:  Active ROM Right eval Left eval  Hip flexion    Hip extension    Hip abduction    Hip adduction    Hip internal rotation    Hip external rotation    Knee flexion  90  Knee extension  0  Ankle dorsiflexion  5  Ankle plantarflexion  30  Ankle inversion  5   Ankle eversion  0   (Blank rows = not tested)  LOWER EXTREMITY MMT:  MMT Right eval Left eval  Hip flexion 4- 3+  Hip extension    Hip abduction 4 3+  Hip adduction 4 3+  Hip internal rotation    Hip external rotation    Knee flexion 4- 3+  Knee extension 4- 3+  Ankle dorsiflexion    Ankle plantarflexion    Ankle inversion    Ankle eversion     (Blank rows = not tested)  FUNCTIONAL TESTS:  Transfers Max A, left knee gives out and goes into ER and tends to bow into varus, has not walked in about 18 months, and the last time I saw her we could walk about 60 feet with HHA and w/c following  GAIT: Distance walked: unable   TODAY'S TREATMENT:  DATE:  06/05/23 Nustep level 5 x 7 minutes Red tband left ankle PF/DF STM to the upper traps and the rhomboids 2# LAQ 3x10 2# marches Red tband HS curls Red tband ankle PF/DF Red tband hip adduction Gait with HHA and W/C follow 2x20', cues for foot placement, weight shift and posture  05/31/23 Transfers really having the left ankle roll, and the left knee turn out, Max A required Nustep level 5 x 7 minutes In pbars practiced standing and worked on weight shifts, keeping left ankle and left knee in, did some right leg marching trying to get weight transfer on the left and hold knee wihtout hyper extension and the hip going back and left ankle from rolling, worked on hips forward and chest and head up Left ankle red tband exercises Left knee TKE in sitting with foot propped red tband Left hip adduction red tband 5 x 10 really focus on consistency   05/29/23 Nustep level 5 x 6 minutes STM to the left upper trap Transfers were much more difficult today with left leg not working well snapping into extension 2.5# LAQ, marches Red tband HS curls Red tband ankle PF/DF Red tband left hip adduction Standing  weight shifts Walk HHA attempted but unable c/o left knee pain and was not putting weight Push on ball with the left foot 2 x 10 with good resistance Walk with HHA again this time able to go 15 feet with some left knee pain at the end, still not wanting to put weight on the left and if she does she snaps the knee back  05/24/23 Gentle PROM of the hand and shoulder on the left STM to the left upper trap Nustep level 4 x 7 minutes 2# hip flexion 2" LAQ Red tband HS curls Red tband ankle PF/DF Red tband hip adduction Gait with WC follow 15' x 2 with HHA with knee brace, ankle brace and shoe  05/22/23 Nustep level 4 x 5 minutes    PATIENT EDUCATION:  Education details: POC Person educated: Patient Education method: Explanation Education comprehension: verbalized understanding  HOME EXERCISE PROGRAM: TBD  ASSESSMENT:  CLINICAL IMPRESSION: Patient is a 82 y.o. female who was seen today for physical therapy evaluation and treatment for debility, with recent Covid and ORIF hardware removal of the ankle. Patient did better today with transfers compared to the last visit where she really struggled and could not walk due to the ankle.  She was able to walk today with HHA and w/c follow, she does need cues to get foot in correct place as well as weight shift as she tends to put foot out to the side and this puts stress on the knee, typically stops at 20 feet due to c/o tenderness in the left knee  OBJECTIVE IMPAIRMENTS: Abnormal gait, cardiopulmonary status limiting activity, decreased activity tolerance, decreased balance, decreased coordination, decreased endurance, decreased mobility, difficulty walking, decreased ROM, decreased strength, increased edema, impaired flexibility, improper body mechanics, postural dysfunction, and pain.   REHAB POTENTIAL: Good  CLINICAL DECISION MAKING: Stable/uncomplicated  EVALUATION COMPLEXITY: Low   GOALS: Goals reviewed with patient? Yes  SHORT  TERM GOALS: Target date: 06/02/23 Independent with advanced HEP Goal status: ongoing 05/31/23  LONG TERM GOALS: Target date: 08/21/23  Independent with advanced HEP with caregiver Goal status: INITIAL  2.  Transfer with set up and CGA Goal status: INITIAL  3.  Walk HHA x 100 feet Goal status: INITIAL  4.  Increase left LE strength to 4/5 Goal  status: INITIAL  5.  Report neck and shoulder pain decreased 50% Goal status: INITIAL  PLAN:  PT FREQUENCY: 1-2x/week  PT DURATION: 12 weeks  PLANNED INTERVENTIONS: Therapeutic exercises, Therapeutic activity, Neuromuscular re-education, Balance training, Gait training, Patient/Family education, Self Care, Joint mobilization, Moist heat, Taping, and Manual therapy  PLAN FOR NEXT SESSION: advance activities really work on stability of the left knee and her body awareness   Jourden Gilson W, PT 06/05/2023, 11:12 AM

## 2023-06-07 ENCOUNTER — Encounter: Payer: Self-pay | Admitting: Physical Therapy

## 2023-06-07 ENCOUNTER — Ambulatory Visit: Payer: Medicare Other | Admitting: Physical Therapy

## 2023-06-07 DIAGNOSIS — M25572 Pain in left ankle and joints of left foot: Secondary | ICD-10-CM

## 2023-06-07 DIAGNOSIS — M6281 Muscle weakness (generalized): Secondary | ICD-10-CM | POA: Diagnosis not present

## 2023-06-07 DIAGNOSIS — R270 Ataxia, unspecified: Secondary | ICD-10-CM

## 2023-06-07 DIAGNOSIS — I69354 Hemiplegia and hemiparesis following cerebral infarction affecting left non-dominant side: Secondary | ICD-10-CM | POA: Diagnosis not present

## 2023-06-07 DIAGNOSIS — M25512 Pain in left shoulder: Secondary | ICD-10-CM

## 2023-06-07 DIAGNOSIS — R262 Difficulty in walking, not elsewhere classified: Secondary | ICD-10-CM | POA: Diagnosis not present

## 2023-06-07 DIAGNOSIS — I6990 Unspecified sequelae of unspecified cerebrovascular disease: Secondary | ICD-10-CM | POA: Diagnosis not present

## 2023-06-07 DIAGNOSIS — M542 Cervicalgia: Secondary | ICD-10-CM

## 2023-06-07 NOTE — Therapy (Signed)
OUTPATIENT PHYSICAL THERAPY LOWER EXTREMITY TREATMENT   Patient Name: Karen Jackson MRN: [id number] DOB:[birth year], 82 y.o., female Today's Date: 06/07/2023  END OF SESSION:  PT End of Session - 06/07/23 1113     Visit Number 6    Date for PT Re-Evaluation 08/21/23    Authorization Type Medicare    PT Start Time 1057    PT Stop Time 1144    PT Time Calculation (min) 47 min    Activity Tolerance Patient tolerated treatment well    Behavior During Therapy WFL for tasks assessed/performed             Past Medical History:  Diagnosis Date   Acute cystitis without hematuria    Acute diastolic CHF (congestive heart failure) (HCC)    Arthritis    Dyspnea    Dysrhythmia    Fever of unknown origin 03/19/2017   Hyperlipidemia    Hypertension    denies at preop   Multifocal pneumonia    Neuromuscular disorder (HCC)    neuropathy left arm and foot   Osteopenia    Paralysis (HCC)    partial left side from CVA    Persistent atrial fibrillation (HCC)    PONV (postoperative nausea and vomiting)    Pre-diabetes    Stroke [facility]) 2013   hemmorahgic   Past Surgical History:  Procedure Laterality Date   ANKLE SURGERY     APPENDECTOMY     CHOLECYSTECTOMY     HARDWARE REMOVAL Left 03/29/2023   Procedure: HARDWARE REMOVAL;  Surgeon: Toni Arthurs, MD;  Location: [facility];  Service: Orthopedics;  Laterality: Left;   HERNIA REPAIR     Esophagus   INCISION AND DRAINAGE OF WOUND Left 03/29/2023   Procedure: LEFT ANKLE WOUND IRRIGATION AND DEBRIDEMENT WOUND;  Surgeon: Toni Arthurs, MD;  Location: [facility];  Service: Orthopedics;  Laterality: Left;   JOINT REPLACEMENT     total- right partial- left   MASTECTOMY PARTIAL / LUMPECTOMY  2012   left   ORIF ANKLE FRACTURE Left 07/20/2018   Procedure: OPEN REDUCTION INTERNAL FIXATION (ORIF) ANKLE FRACTURE;  Surgeon: Toni Arthurs, MD;  Location: [facility];  Service: Orthopedics;  Laterality: Left;   TOTAL KNEE  ARTHROPLASTY Left 01/27/2019   Procedure: TOTAL KNEE ARTHROPLASTY;  Surgeon: Dannielle Huh, MD;  Location: [facility];  Service: Orthopedics;  Laterality: Left;   Patient Active Problem List   Diagnosis Date Noted   Goals of care, counseling/discussion 01/17/2022   Grief 01/17/2022   Secondary hypercoagulable state (HCC) 03/16/2021   Dizziness 03/10/2021   Orthostatic hypotension 10/15/2020   Presbycusis of both ears 03/08/2020   Mixed stress and urge urinary incontinence 12/02/2019   Macrocytosis 12/01/2019   Nutritional anemia 12/01/2019   S/P total knee replacement 01/27/2019   Recurrent left knee instability 07/05/2018   Respiratory failure with hypoxia (HCC) 08/30/2017   Hypoxemia    Heart failure with preserved ejection fraction (HCC), Grade 3 diastolic dysfunction 03/26/2017   PAF (paroxysmal atrial fibrillation) (HCC)    Dyspnea 03/19/2017   Encounter for preventive health examination 02/17/2016   Sensorineural hearing loss (SNHL), bilateral 01/26/2016   Hypomagnesemia 04/24/2014   Hemiparesis affecting left side as late effect of cerebrovascular accident (HCC) 04/24/2014   Nontraumatic cerebral hemorrhage (HCC) 04/30/2012   DM (diabetes mellitus) with complications (HCC) 03/04/2010   OSTEOPENIA 01/21/2009   UNSPECIFIED VITAMIN D DEFICIENCY 11/19/2007   HYPERCHOLESTEROLEMIA 10/25/2006   GASTROESOPHAGEAL REFLUX, NO ESOPHAGITIS 10/25/2006   DIVERTICULOSIS OF COLON  10/25/2006   Osteoarthritis 10/25/2006   CERVICAL SPINE DISORDER, NOS 10/25/2006    PCP: Waynard Edwards, MD  REFERRING PROVIDER: Waynard Edwards, MD  REFERRING DIAG: s/p left ankle ORIF hardware removal  THERAPY DIAG:  Difficulty in walking, not elsewhere classified  Muscle weakness (generalized)  Hemiplegia and hemiparesis following cerebral infarction affecting left non-dominant side (HCC)  Ataxia  Pain in left ankle and joints of left foot  Late effects of CVA (cerebrovascular accident)  Cervicalgia  Left shoulder  pain, unspecified chronicity  Rationale for Evaluation and Treatment: Rehabilitation  ONSET DATE: 03/29/23  SUBJECTIVE:   SUBJECTIVE STATEMENT: Patient underwent left ankle ORIF hardware removal on 03/29/23 due to infection.  Reports that she gets a little sore but not bad after PT treatment and the soreness is in the knee and only with walking  PERTINENT HISTORY: CVA, CHF, HTN, A-fib, TKA PAIN:  Are you having pain? Yes: NPRS scale: 5/10 Pain location: left upper trap and neck, left ankle and knee  Pain description: ache sore Aggravating factors: neck and shoulder always hurt, knee and ankle hurt with transfers Relieving factors: massage, heat  PRECAUTIONS: None  RED FLAGS: None   WEIGHT BEARING RESTRICTIONS: No  FALLS:  Has patient fallen in last 6 months? No  LIVING ENVIRONMENT: Lives with: lives alone Lives in: House/apartment Stairs: No Has following equipment at home: Environmental consultant - 2 wheeled, Wheelchair (manual), shower chair, bed side commode, Grab bars, and Ramped entry  OCCUPATION: retired  PLOF: Needs assistance with homemaking, Needs assistance with transfers, and Leisure: plays bridge, she has 11 hours of an aide at home that helps with meals, going to MD's, dressing and bathing, is alone at night  PATIENT GOALS: walk, have less pain, transfer without difficulty  NEXT MD VISIT: none scheduled  OBJECTIVE:   DIAGNOSTIC FINDINGS: none performed  COGNITION: Overall cognitive status: Within functional limits for tasks assessed     SENSATION: WFL POSTURE: rounded shoulders and forward head  PALPATION: Left knee and ankle are swollen and tender, left upper and neck  LOWER EXTREMITY ROM:  Active ROM Right eval Left eval  Hip flexion    Hip extension    Hip abduction    Hip adduction    Hip internal rotation    Hip external rotation    Knee flexion  90  Knee extension  0  Ankle dorsiflexion  5  Ankle plantarflexion  30  Ankle inversion  5  Ankle  eversion  0   (Blank rows = not tested)  LOWER EXTREMITY MMT:  MMT Right eval Left eval  Hip flexion 4- 3+  Hip extension    Hip abduction 4 3+  Hip adduction 4 3+  Hip internal rotation    Hip external rotation    Knee flexion 4- 3+  Knee extension 4- 3+  Ankle dorsiflexion    Ankle plantarflexion    Ankle inversion    Ankle eversion     (Blank rows = not tested)  FUNCTIONAL TESTS:  Transfers Max A, left knee gives out and goes into ER and tends to bow into varus, has not walked in about 18 months, and the last time I saw her we could walk about 60 feet with HHA and w/c following  GAIT: Distance walked: unable   TODAY'S TREATMENT:  DATE:  06/07/23 STM to the upper traps and neck Red tband ankle DF/PF Red tband hip adduction Red tband HS curls 3# left leg LAQ 3# left leg marches Transfers today with HHA and needs a lot of cues to get all the way to the chair and back up Gait HHA with w/c follow 2x20 feet has to stop due to tenderness in the knee  06/05/23 Nustep level 5 x 7 minutes Red tband left ankle PF/DF STM to the upper traps and the rhomboids 2# LAQ 3x10 2# marches Red tband HS curls Red tband ankle PF/DF Red tband hip adduction Gait with HHA and W/C follow 2x20', cues for foot placement, weight shift and posture  05/31/23 Transfers really having the left ankle roll, and the left knee turn out, Max A required Nustep level 5 x 7 minutes In pbars practiced standing and worked on weight shifts, keeping left ankle and left knee in, did some right leg marching trying to get weight transfer on the left and hold knee wihtout hyper extension and the hip going back and left ankle from rolling, worked on hips forward and chest and head up Left ankle red tband exercises Left knee TKE in sitting with foot propped red tband Left hip adduction red  tband 5 x 10 really focus on consistency   05/29/23 Nustep level 5 x 6 minutes STM to the left upper trap Transfers were much more difficult today with left leg not working well snapping into extension 2.5# LAQ, marches Red tband HS curls Red tband ankle PF/DF Red tband left hip adduction Standing weight shifts Walk HHA attempted but unable c/o left knee pain and was not putting weight Push on ball with the left foot 2 x 10 with good resistance Walk with HHA again this time able to go 15 feet with some left knee pain at the end, still not wanting to put weight on the left and if she does she snaps the knee back  05/24/23 Gentle PROM of the hand and shoulder on the left STM to the left upper trap Nustep level 4 x 7 minutes 2# hip flexion 2" LAQ Red tband HS curls Red tband ankle PF/DF Red tband hip adduction Gait with WC follow 15' x 2 with HHA with knee brace, ankle brace and shoe  05/22/23 Nustep level 4 x 5 minutes    PATIENT EDUCATION:  Education details: POC Person educated: Patient Education method: Explanation Education comprehension: verbalized understanding  HOME EXERCISE PROGRAM: TBD  ASSESSMENT:  CLINICAL IMPRESSION: Patient is a 82 y.o. female who was seen today for physical therapy evaluation and treatment for debility, with recent Covid and ORIF hardware removal of the ankle. Patient continues to have difficulty with safety during transfers she does not get turned all the way and twists into the chair from too far of a distance this is even with verbal cues  OBJECTIVE IMPAIRMENTS: Abnormal gait, cardiopulmonary status limiting activity, decreased activity tolerance, decreased balance, decreased coordination, decreased endurance, decreased mobility, difficulty walking, decreased ROM, decreased strength, increased edema, impaired flexibility, improper body mechanics, postural dysfunction, and pain.   REHAB POTENTIAL: Good  CLINICAL DECISION MAKING:  Stable/uncomplicated  EVALUATION COMPLEXITY: Low   GOALS: Goals reviewed with patient? Yes  SHORT TERM GOALS: Target date: 06/02/23 Independent with advanced HEP Goal status: met 06/07/23  LONG TERM GOALS: Target date: 08/21/23  Independent with advanced HEP with caregiver Goal status: INITIAL  2.  Transfer with set up and CGA Goal status: INITIAL  3.  Walk HHA x 100 feet Goal status: INITIAL  4.  Increase left LE strength to 4/5 Goal status: INITIAL  5.  Report neck and shoulder pain decreased 50% Goal status: INITIAL  PLAN:  PT FREQUENCY: 1-2x/week  PT DURATION: 12 weeks  PLANNED INTERVENTIONS: Therapeutic exercises, Therapeutic activity, Neuromuscular re-education, Balance training, Gait training, Patient/Family education, Self Care, Joint mobilization, Moist heat, Taping, and Manual therapy  PLAN FOR NEXT SESSION: advance activities really work on stability of the left knee and her body awareness   Quantasia Stegner W, PT 06/07/2023, 11:13 AM

## 2023-06-12 ENCOUNTER — Other Ambulatory Visit (HOSPITAL_COMMUNITY): Payer: Self-pay | Admitting: *Deleted

## 2023-06-12 ENCOUNTER — Ambulatory Visit: Payer: Medicare Other | Admitting: Physical Therapy

## 2023-06-12 ENCOUNTER — Encounter: Payer: Self-pay | Admitting: Physical Therapy

## 2023-06-12 DIAGNOSIS — I6990 Unspecified sequelae of unspecified cerebrovascular disease: Secondary | ICD-10-CM

## 2023-06-12 DIAGNOSIS — R270 Ataxia, unspecified: Secondary | ICD-10-CM | POA: Diagnosis not present

## 2023-06-12 DIAGNOSIS — R262 Difficulty in walking, not elsewhere classified: Secondary | ICD-10-CM | POA: Diagnosis not present

## 2023-06-12 DIAGNOSIS — M6281 Muscle weakness (generalized): Secondary | ICD-10-CM | POA: Diagnosis not present

## 2023-06-12 DIAGNOSIS — I69354 Hemiplegia and hemiparesis following cerebral infarction affecting left non-dominant side: Secondary | ICD-10-CM

## 2023-06-12 DIAGNOSIS — M542 Cervicalgia: Secondary | ICD-10-CM

## 2023-06-12 DIAGNOSIS — I6930 Unspecified sequelae of cerebral infarction: Secondary | ICD-10-CM

## 2023-06-12 DIAGNOSIS — M25572 Pain in left ankle and joints of left foot: Secondary | ICD-10-CM | POA: Diagnosis not present

## 2023-06-12 DIAGNOSIS — M25512 Pain in left shoulder: Secondary | ICD-10-CM

## 2023-06-12 NOTE — Therapy (Signed)
OUTPATIENT PHYSICAL THERAPY LOWER EXTREMITY TREATMENT   Patient Name: Karen Jackson MRN: [id number] DOB:1940/11/08, 82 y.o., female Today's Date: 06/12/2023  END OF SESSION:  PT End of Session - 06/12/23 1101     Visit Number 7    Date for PT Re-Evaluation 08/21/23    Authorization Type Medicare    PT Start Time 1057    PT Stop Time 1146    PT Time Calculation (min) 49 min    Activity Tolerance Patient tolerated treatment well    Behavior During Therapy WFL for tasks assessed/performed             Past Medical History:  Diagnosis Date   Acute cystitis without hematuria    Acute diastolic CHF (congestive heart failure) (HCC)    Arthritis    Dyspnea    Dysrhythmia    Fever of unknown origin 03/19/2017   Hyperlipidemia    Hypertension    denies at preop   Multifocal pneumonia    Neuromuscular disorder (HCC)    neuropathy left arm and foot   Osteopenia    Paralysis (HCC)    partial left side from CVA    Persistent atrial fibrillation (HCC)    PONV (postoperative nausea and vomiting)    Pre-diabetes    Stroke [facility]) 2013   hemmorahgic   Past Surgical History:  Procedure Laterality Date   ANKLE SURGERY     APPENDECTOMY     CHOLECYSTECTOMY     HARDWARE REMOVAL Left 03/29/2023   Procedure: HARDWARE REMOVAL;  Surgeon: Toni Arthurs, MD;  Location: [facility];  Service: Orthopedics;  Laterality: Left;   HERNIA REPAIR     Esophagus   INCISION AND DRAINAGE OF WOUND Left 03/29/2023   Procedure: LEFT ANKLE WOUND IRRIGATION AND DEBRIDEMENT WOUND;  Surgeon: Toni Arthurs, MD;  Location: [facility];  Service: Orthopedics;  Laterality: Left;   JOINT REPLACEMENT     total- right partial- left   MASTECTOMY PARTIAL / LUMPECTOMY  2012   left   ORIF ANKLE FRACTURE Left 07/20/2018   Procedure: OPEN REDUCTION INTERNAL FIXATION (ORIF) ANKLE FRACTURE;  Surgeon: Toni Arthurs, MD;  Location: [facility];  Service: Orthopedics;  Laterality: Left;   TOTAL KNEE  ARTHROPLASTY Left 01/27/2019   Procedure: TOTAL KNEE ARTHROPLASTY;  Surgeon: Dannielle Huh, MD;  Location: [facility];  Service: Orthopedics;  Laterality: Left;   Patient Active Problem List   Diagnosis Date Noted   Goals of care, counseling/discussion 01/17/2022   Grief 01/17/2022   Secondary hypercoagulable state (HCC) 03/16/2021   Dizziness 03/10/2021   Orthostatic hypotension 10/15/2020   Presbycusis of both ears 03/08/2020   Mixed stress and urge urinary incontinence 12/02/2019   Macrocytosis 12/01/2019   Nutritional anemia 12/01/2019   S/P total knee replacement 01/27/2019   Recurrent left knee instability 07/05/2018   Respiratory failure with hypoxia (HCC) 08/30/2017   Hypoxemia    Heart failure with preserved ejection fraction (HCC), Grade 3 diastolic dysfunction 03/26/2017   PAF (paroxysmal atrial fibrillation) (HCC)    Dyspnea 03/19/2017   Encounter for preventive health examination 02/17/2016   Sensorineural hearing loss (SNHL), bilateral 01/26/2016   Hypomagnesemia 04/24/2014   Hemiparesis affecting left side as late effect of cerebrovascular accident (HCC) 04/24/2014   Nontraumatic cerebral hemorrhage (HCC) 04/30/2012   DM (diabetes mellitus) with complications (HCC) 03/04/2010   OSTEOPENIA 01/21/2009   UNSPECIFIED VITAMIN D DEFICIENCY 11/19/2007   HYPERCHOLESTEROLEMIA 10/25/2006   GASTROESOPHAGEAL REFLUX, NO ESOPHAGITIS 10/25/2006   DIVERTICULOSIS OF COLON  10/25/2006   Osteoarthritis 10/25/2006   CERVICAL SPINE DISORDER, NOS 10/25/2006    PCP: Waynard Edwards, MD  REFERRING PROVIDER: Waynard Edwards, MD  REFERRING DIAG: s/p left ankle ORIF hardware removal  THERAPY DIAG:  Difficulty in walking, not elsewhere classified  Muscle weakness (generalized)  Hemiplegia and hemiparesis following cerebral infarction affecting left non-dominant side (HCC)  Ataxia  Pain in left ankle and joints of left foot  Late effects of CVA (cerebrovascular accident)  Cervicalgia  Left shoulder  pain, unspecified chronicity  Rationale for Evaluation and Treatment: Rehabilitation  ONSET DATE: 03/29/23  SUBJECTIVE:   SUBJECTIVE STATEMENT: Patient underwent left ankle ORIF hardware removal on 03/29/23 due to infection.  I did a good job with transfers over the weekend.  PERTINENT HISTORY: CVA, CHF, HTN, A-fib, TKA PAIN:  Are you having pain? Yes: NPRS scale: 5/10 Pain location: left upper trap and neck, left ankle and knee  Pain description: ache sore Aggravating factors: neck and shoulder always hurt, knee and ankle hurt with transfers Relieving factors: massage, heat  PRECAUTIONS: None  RED FLAGS: None   WEIGHT BEARING RESTRICTIONS: No  FALLS:  Has patient fallen in last 6 months? No  LIVING ENVIRONMENT: Lives with: lives alone Lives in: House/apartment Stairs: No Has following equipment at home: Environmental consultant - 2 wheeled, Wheelchair (manual), shower chair, bed side commode, Grab bars, and Ramped entry  OCCUPATION: retired  PLOF: Needs assistance with homemaking, Needs assistance with transfers, and Leisure: plays bridge, she has 11 hours of an aide at home that helps with meals, going to MD's, dressing and bathing, is alone at night  PATIENT GOALS: walk, have less pain, transfer without difficulty  NEXT MD VISIT: none scheduled  OBJECTIVE:   DIAGNOSTIC FINDINGS: none performed  COGNITION: Overall cognitive status: Within functional limits for tasks assessed     SENSATION: WFL POSTURE: rounded shoulders and forward head  PALPATION: Left knee and ankle are swollen and tender, left upper and neck  LOWER EXTREMITY ROM:  Active ROM Right eval Left eval  Hip flexion    Hip extension    Hip abduction    Hip adduction    Hip internal rotation    Hip external rotation    Knee flexion  90  Knee extension  0  Ankle dorsiflexion  5  Ankle plantarflexion  30  Ankle inversion  5  Ankle eversion  0   (Blank rows = not tested)  LOWER EXTREMITY MMT:  MMT  Right eval Left eval  Hip flexion 4- 3+  Hip extension    Hip abduction 4 3+  Hip adduction 4 3+  Hip internal rotation    Hip external rotation    Knee flexion 4- 3+  Knee extension 4- 3+  Ankle dorsiflexion    Ankle plantarflexion    Ankle inversion    Ankle eversion     (Blank rows = not tested)  FUNCTIONAL TESTS:  Transfers Max A, left knee gives out and goes into ER and tends to bow into varus, has not walked in about 18 months, and the last time I saw her we could walk about 60 feet with HHA and w/c following  GAIT: Distance walked: unable   TODAY'S TREATMENT:  DATE:  06/12/23 Nustep level 5 x 8 minutes STM to the left upper trap, neck and rhomboid due to tightness and pain Gait with HHA and w/c follow 25 feet x 2 Standing weight shifts with cues for left knee 3# marches in sitting 3# LAQ Green tband HS curls Red tband ankle PF/DF Red tband hip adduction Red tband hip abduction Transfers with cues for full turn and back up  06/07/23 STM to the upper traps and neck Red tband ankle DF/PF Red tband hip adduction Red tband HS curls 3# left leg LAQ 3# left leg marches Transfers today with HHA and needs a lot of cues to get all the way to the chair and back up Gait HHA with w/c follow 2x20 feet has to stop due to tenderness in the knee  06/05/23 Nustep level 5 x 7 minutes Red tband left ankle PF/DF STM to the upper traps and the rhomboids 2# LAQ 3x10 2# marches Red tband HS curls Red tband ankle PF/DF Red tband hip adduction Gait with HHA and W/C follow 2x20', cues for foot placement, weight shift and posture  05/31/23 Transfers really having the left ankle roll, and the left knee turn out, Max A required Nustep level 5 x 7 minutes In pbars practiced standing and worked on weight shifts, keeping left ankle and left knee in, did some  right leg marching trying to get weight transfer on the left and hold knee wihtout hyper extension and the hip going back and left ankle from rolling, worked on hips forward and chest and head up Left ankle red tband exercises Left knee TKE in sitting with foot propped red tband Left hip adduction red tband 5 x 10 really focus on consistency   05/29/23 Nustep level 5 x 6 minutes STM to the left upper trap Transfers were much more difficult today with left leg not working well snapping into extension 2.5# LAQ, marches Red tband HS curls Red tband ankle PF/DF Red tband left hip adduction Standing weight shifts Walk HHA attempted but unable c/o left knee pain and was not putting weight Push on ball with the left foot 2 x 10 with good resistance Walk with HHA again this time able to go 15 feet with some left knee pain at the end, still not wanting to put weight on the left and if she does she snaps the knee back  05/24/23 Gentle PROM of the hand and shoulder on the left STM to the left upper trap Nustep level 4 x 7 minutes 2# hip flexion 2" LAQ Red tband HS curls Red tband ankle PF/DF Red tband hip adduction Gait with WC follow 15' x 2 with HHA with knee brace, ankle brace and shoe  05/22/23 Nustep level 4 x 5 minutes    PATIENT EDUCATION:  Education details: POC Person educated: Patient Education method: Explanation Education comprehension: verbalized understanding  HOME EXERCISE PROGRAM: TBD  ASSESSMENT:  CLINICAL IMPRESSION: Patient is a 82 y.o. female who was seen today for physical therapy evaluation and treatment for debility, with recent Covid and ORIF hardware removal of the ankle. Patient was able to walk 25 feet with HHA and w/c follow today, still some tenderness in the left knee with this, she is doing a little better with the foot placement and control of the left knee with walking.  Still at times during transfers with not turn fully and will leave the left leg  out and fall into chair as she knows she is close  OBJECTIVE IMPAIRMENTS: Abnormal gait, cardiopulmonary status limiting activity, decreased activity tolerance, decreased balance, decreased coordination, decreased endurance, decreased mobility, difficulty walking, decreased ROM, decreased strength, increased edema, impaired flexibility, improper body mechanics, postural dysfunction, and pain.   REHAB POTENTIAL: Good  CLINICAL DECISION MAKING: Stable/uncomplicated  EVALUATION COMPLEXITY: Low   GOALS: Goals reviewed with patient? Yes  SHORT TERM GOALS: Target date: 06/02/23 Independent with advanced HEP Goal status: met 06/07/23  LONG TERM GOALS: Target date: 08/21/23  Independent with advanced HEP with caregiver Goal status: INITIAL  2.  Transfer with set up and CGA Goal status: INITIAL  3.  Walk HHA x 100 feet Goal status: INITIAL  4.  Increase left LE strength to 4/5 Goal status: INITIAL  5.  Report neck and shoulder pain decreased 50% Goal status: INITIAL  PLAN:  PT FREQUENCY: 1-2x/week  PT DURATION: 12 weeks  PLANNED INTERVENTIONS: Therapeutic exercises, Therapeutic activity, Neuromuscular re-education, Balance training, Gait training, Patient/Family education, Self Care, Joint mobilization, Moist heat, Taping, and Manual therapy  PLAN FOR NEXT SESSION: advance activities really work on stability of the left knee and her body awareness   Awab Abebe W, PT 06/12/2023, 11:01 AM

## 2023-06-13 ENCOUNTER — Ambulatory Visit (HOSPITAL_COMMUNITY)
Admission: RE | Admit: 2023-06-13 | Discharge: 2023-06-13 | Disposition: A | Discharge status: Home / Self Care | Payer: Medicare Other | Source: Ambulatory Visit | Attending: Internal Medicine | Admitting: Internal Medicine

## 2023-06-13 DIAGNOSIS — M810 Age-related osteoporosis without current pathological fracture: Secondary | ICD-10-CM | POA: Diagnosis not present

## 2023-06-13 MED ORDER — DENOSUMAB 60 MG/ML ~~LOC~~ SOSY
PREFILLED_SYRINGE | SUBCUTANEOUS | Status: AC
  Filled 2023-06-13: qty 1

## 2023-06-13 MED ORDER — DENOSUMAB 60 MG/ML ~~LOC~~ SOSY
60.0000 mg | PREFILLED_SYRINGE | Freq: Once | SUBCUTANEOUS | Status: AC
  Administered 2023-06-13: 60 mg via SUBCUTANEOUS

## 2023-06-14 ENCOUNTER — Encounter: Payer: Self-pay | Admitting: Physical Therapy

## 2023-06-14 ENCOUNTER — Ambulatory Visit: Payer: Medicare Other | Admitting: Physical Therapy

## 2023-06-14 DIAGNOSIS — M25572 Pain in left ankle and joints of left foot: Secondary | ICD-10-CM

## 2023-06-14 DIAGNOSIS — I6930 Unspecified sequelae of cerebral infarction: Secondary | ICD-10-CM

## 2023-06-14 DIAGNOSIS — M542 Cervicalgia: Secondary | ICD-10-CM

## 2023-06-14 DIAGNOSIS — I69354 Hemiplegia and hemiparesis following cerebral infarction affecting left non-dominant side: Secondary | ICD-10-CM

## 2023-06-14 DIAGNOSIS — I6990 Unspecified sequelae of unspecified cerebrovascular disease: Secondary | ICD-10-CM

## 2023-06-14 DIAGNOSIS — R262 Difficulty in walking, not elsewhere classified: Secondary | ICD-10-CM | POA: Diagnosis not present

## 2023-06-14 DIAGNOSIS — R270 Ataxia, unspecified: Secondary | ICD-10-CM

## 2023-06-14 DIAGNOSIS — M25512 Pain in left shoulder: Secondary | ICD-10-CM

## 2023-06-14 DIAGNOSIS — M6281 Muscle weakness (generalized): Secondary | ICD-10-CM | POA: Diagnosis not present

## 2023-06-14 NOTE — Therapy (Signed)
OUTPATIENT PHYSICAL THERAPY LOWER EXTREMITY TREATMENT   Patient Name: Karen Jackson MRN: [id number] DOB:April 30, 1941, 82 y.o., female Today's Date: 06/14/2023  END OF SESSION:  PT End of Session - 06/14/23 1116     Visit Number 8    Date for PT Re-Evaluation 08/21/23    Authorization Type Medicare    PT Start Time 1057    PT Stop Time 1145    PT Time Calculation (min) 48 min    Activity Tolerance Patient tolerated treatment well    Behavior During Therapy [facility] for tasks assessed/performed             Past Medical History:  Diagnosis Date   Acute cystitis without hematuria    Acute diastolic CHF (congestive heart failure) (HCC)    Arthritis    Dyspnea    Dysrhythmia    Fever of unknown origin 03/19/2017   Hyperlipidemia    Hypertension    denies at preop   Multifocal pneumonia    Neuromuscular disorder (HCC)    neuropathy left arm and foot   Osteopenia    Paralysis (HCC)    partial left side from CVA    Persistent atrial fibrillation (HCC)    PONV (postoperative nausea and vomiting)    Pre-diabetes    Stroke [facility]) 2013   hemmorahgic   Past Surgical History:  Procedure Laterality Date   ANKLE SURGERY     APPENDECTOMY     CHOLECYSTECTOMY     HARDWARE REMOVAL Left 03/29/2023   Procedure: HARDWARE REMOVAL;  Surgeon: Karen Arthurs, MD;  Location: [facility];  Service: Orthopedics;  Laterality: Left;   HERNIA REPAIR     Esophagus   INCISION AND DRAINAGE OF WOUND Left 03/29/2023   Procedure: LEFT ANKLE WOUND IRRIGATION AND DEBRIDEMENT WOUND;  Surgeon: Karen Arthurs, MD;  Location: [facility];  Service: Orthopedics;  Laterality: Left;   JOINT REPLACEMENT     total- right partial- left   MASTECTOMY PARTIAL / LUMPECTOMY  2012   left   ORIF ANKLE FRACTURE Left 07/20/2018   Procedure: OPEN REDUCTION INTERNAL FIXATION (ORIF) ANKLE FRACTURE;  Surgeon: Karen Arthurs, MD;  Location: [facility];  Service: Orthopedics;  Laterality: Left;   TOTAL KNEE  ARTHROPLASTY Left 01/27/2019   Procedure: TOTAL KNEE ARTHROPLASTY;  Surgeon: Karen Huh, MD;  Location: [facility];  Service: Orthopedics;  Laterality: Left;   Patient Active Problem List   Diagnosis Date Noted   Goals of care, counseling/discussion 01/17/2022   Grief 01/17/2022   Secondary hypercoagulable state (HCC) 03/16/2021   Dizziness 03/10/2021   Orthostatic hypotension 10/15/2020   Presbycusis of both ears 03/08/2020   Mixed stress and urge urinary incontinence 12/02/2019   Macrocytosis 12/01/2019   Nutritional anemia 12/01/2019   S/P total knee replacement 01/27/2019   Recurrent left knee instability 07/05/2018   Respiratory failure with hypoxia (HCC) 08/30/2017   Hypoxemia    Heart failure with preserved ejection fraction (HCC), Grade 3 diastolic dysfunction 03/26/2017   PAF (paroxysmal atrial fibrillation) (HCC)    Dyspnea 03/19/2017   Encounter for preventive health examination 02/17/2016   Sensorineural hearing loss (SNHL), bilateral 01/26/2016   Hypomagnesemia 04/24/2014   Hemiparesis affecting left side as late effect of cerebrovascular accident (HCC) 04/24/2014   Nontraumatic cerebral hemorrhage (HCC) 04/30/2012   DM (diabetes mellitus) with complications (HCC) 03/04/2010   OSTEOPENIA 01/21/2009   UNSPECIFIED VITAMIN D DEFICIENCY 11/19/2007   HYPERCHOLESTEROLEMIA 10/25/2006   GASTROESOPHAGEAL REFLUX, NO ESOPHAGITIS 10/25/2006   DIVERTICULOSIS OF COLON  10/25/2006   Osteoarthritis 10/25/2006   CERVICAL SPINE DISORDER, NOS 10/25/2006    PCP: Karen Edwards, MD  REFERRING PROVIDER: Waynard Edwards, MD  REFERRING DIAG: s/p left ankle ORIF hardware removal  THERAPY DIAG:  Difficulty in walking, not elsewhere classified  Muscle weakness (generalized)  Hemiplegia and hemiparesis following cerebral infarction affecting left non-dominant side (HCC)  Ataxia  Pain in left ankle and joints of left foot  Late effects of CVA (cerebrovascular accident)  Cervicalgia  Left shoulder  pain, unspecified chronicity  Rationale for Evaluation and Treatment: Rehabilitation  ONSET DATE: 03/29/23  SUBJECTIVE:   SUBJECTIVE STATEMENT: Patient underwent left ankle ORIF hardware removal on 03/29/23 due to infection.  Caregiver wants to show me how they transfer with Karen Jackson PERTINENT HISTORY: CVA, CHF, HTN, A-fib, TKA PAIN:  Are you having pain? Yes: NPRS scale: 7/10 Pain location: left upper trap and neck, left ankle and knee  Pain description: ache sore Aggravating factors: neck and shoulder always hurt, knee and ankle hurt with transfers Relieving factors: massage, heat  PRECAUTIONS: None  RED FLAGS: None   WEIGHT BEARING RESTRICTIONS: No  FALLS:  Has patient fallen in last 6 months? No  LIVING ENVIRONMENT: Lives with: lives alone Lives in: House/apartment Stairs: No Has following equipment at home: Environmental consultant - 2 wheeled, Wheelchair (manual), shower chair, bed side commode, Grab bars, and Ramped entry  OCCUPATION: retired  PLOF: Needs assistance with homemaking, Needs assistance with transfers, and Leisure: plays bridge, she has 11 hours of an aide at home that helps with meals, going to MD's, dressing and bathing, is alone at night  PATIENT GOALS: walk, have less pain, transfer without difficulty  NEXT MD VISIT: none scheduled  OBJECTIVE:   DIAGNOSTIC FINDINGS: none performed  COGNITION: Overall cognitive status: Within functional limits for tasks assessed     SENSATION: WFL POSTURE: rounded shoulders and forward head  PALPATION: Left knee and ankle are swollen and tender, left upper and neck  LOWER EXTREMITY ROM:  Active ROM Right eval Left eval  Hip flexion    Hip extension    Hip abduction    Hip adduction    Hip internal rotation    Hip external rotation    Knee flexion  90  Knee extension  0  Ankle dorsiflexion  5  Ankle plantarflexion  30  Ankle inversion  5  Ankle eversion  0   (Blank rows = not tested)  LOWER EXTREMITY MMT:  MMT  Right eval Left eval  Hip flexion 4- 3+  Hip extension    Hip abduction 4 3+  Hip adduction 4 3+  Hip internal rotation    Hip external rotation    Knee flexion 4- 3+  Knee extension 4- 3+  Ankle dorsiflexion    Ankle plantarflexion    Ankle inversion    Ankle eversion     (Blank rows = not tested)  FUNCTIONAL TESTS:  Transfers Max A, left knee gives out and goes into ER and tends to bow into varus, has not walked in about 18 months, and the last time I saw her we could walk about 60 feet with HHA and w/c following  GAIT: Distance walked: unable   TODAY'S TREATMENT:  DATE:  06/14/23 Transfer with caregiver, did well had the chair farther away from the car and thi made Yanara walk a little more and needed a few cues to make sure she turns all the way to square on the chair, her tendency is to fall into it without moving the left leg back 5# LAQ 5# march Nustep level 5 x 8 minutes Seated on the mat table abdominal work crunches and then some obliques trying to touch elbow to the mat table , volleyball with the right arm Gait x 15' HHA, 30 feet with HHA and w/c follow and then 25'  06/12/23 Nustep level 5 x 8 minutes STM to the left upper trap, neck and rhomboid due to tightness and pain Gait with HHA and w/c follow 25 feet x 2 Standing weight shifts with cues for left knee 3# marches in sitting 3# LAQ Green tband HS curls Red tband ankle PF/DF Red tband hip adduction Red tband hip abduction Transfers with cues for full turn and back up  06/07/23 STM to the upper traps and neck Red tband ankle DF/PF Red tband hip adduction Red tband HS curls 3# left leg LAQ 3# left leg marches Transfers today with HHA and needs a lot of cues to get all the way to the chair and back up Gait HHA with w/c follow 2x20 feet has to stop due to tenderness in the  knee  06/05/23 Nustep level 5 x 7 minutes Red tband left ankle PF/DF STM to the upper traps and the rhomboids 2# LAQ 3x10 2# marches Red tband HS curls Red tband ankle PF/DF Red tband hip adduction Gait with HHA and W/C follow 2x20', cues for foot placement, weight shift and posture  05/31/23 Transfers really having the left ankle roll, and the left knee turn out, Max A required Nustep level 5 x 7 minutes In pbars practiced standing and worked on weight shifts, keeping left ankle and left knee in, did some right leg marching trying to get weight transfer on the left and hold knee wihtout hyper extension and the hip going back and left ankle from rolling, worked on hips forward and chest and head up Left ankle red tband exercises Left knee TKE in sitting with foot propped red tband Left hip adduction red tband 5 x 10 really focus on consistency   05/29/23 Nustep level 5 x 6 minutes STM to the left upper trap Transfers were much more difficult today with left leg not working well snapping into extension 2.5# LAQ, marches Red tband HS curls Red tband ankle PF/DF Red tband left hip adduction Standing weight shifts Walk HHA attempted but unable c/o left knee pain and was not putting weight Push on ball with the left foot 2 x 10 with good resistance Walk with HHA again this time able to go 15 feet with some left knee pain at the end, still not wanting to put weight on the left and if she does she snaps the knee back  05/24/23 Gentle PROM of the hand and shoulder on the left STM to the left upper trap Nustep level 4 x 7 minutes 2# hip flexion 2" LAQ Red tband HS curls Red tband ankle PF/DF Red tband hip adduction Gait with WC follow 15' x 2 with HHA with knee brace, ankle brace and shoe  05/22/23 Nustep level 4 x 5 minutes    PATIENT EDUCATION:  Education details: POC Person educated: Patient Education method: Explanation Education comprehension: verbalized  understanding  HOME EXERCISE PROGRAM: TBD  ASSESSMENT:  CLINICAL IMPRESSION: Patient is a 82 y.o. female who was seen today for physical therapy evaluation and treatment for debility, with recent Covid and ORIF hardware removal of the ankle. Had her walk 30', 15 and 25' today, she did well , still is getting "tenderness" in the left medial knee with walking.  I added some core work today, she did well but reported that it really made her tired, "it was harder than I thought"  OBJECTIVE IMPAIRMENTS: Abnormal gait, cardiopulmonary status limiting activity, decreased activity tolerance, decreased balance, decreased coordination, decreased endurance, decreased mobility, difficulty walking, decreased ROM, decreased strength, increased edema, impaired flexibility, improper body mechanics, postural dysfunction, and pain.   REHAB POTENTIAL: Good  CLINICAL DECISION MAKING: Stable/uncomplicated  EVALUATION COMPLEXITY: Low   GOALS: Goals reviewed with patient? Yes  SHORT TERM GOALS: Target date: 06/02/23 Independent with advanced HEP Goal status: met 06/07/23  LONG TERM GOALS: Target date: 08/21/23  Independent with advanced HEP with caregiver Goal status: INITIAL  2.  Transfer with set up and CGA Goal status: INITIAL  3.  Walk HHA x 100 feet Goal status: ongoing 06/14/23  4.  Increase left LE strength to 4/5 Goal status: INITIAL  5.  Report neck and shoulder pain decreased 50% Goal status: INITIAL  PLAN:  PT FREQUENCY: 1-2x/week  PT DURATION: 12 weeks  PLANNED INTERVENTIONS: Therapeutic exercises, Therapeutic activity, Neuromuscular re-education, Balance training, Gait training, Patient/Family education, Self Care, Joint mobilization, Moist heat, Taping, and Manual therapy  PLAN FOR NEXT SESSION: advance activities really work on stability of the left knee and her body awareness   Francely Craw W, PT 06/14/2023, 11:16 AM

## 2023-06-19 ENCOUNTER — Encounter: Payer: Self-pay | Admitting: Physical Therapy

## 2023-06-19 ENCOUNTER — Ambulatory Visit: Payer: Medicare Other | Admitting: Physical Therapy

## 2023-06-19 DIAGNOSIS — I6990 Unspecified sequelae of unspecified cerebrovascular disease: Secondary | ICD-10-CM | POA: Diagnosis not present

## 2023-06-19 DIAGNOSIS — M25572 Pain in left ankle and joints of left foot: Secondary | ICD-10-CM | POA: Diagnosis not present

## 2023-06-19 DIAGNOSIS — I69354 Hemiplegia and hemiparesis following cerebral infarction affecting left non-dominant side: Secondary | ICD-10-CM | POA: Diagnosis not present

## 2023-06-19 DIAGNOSIS — M6281 Muscle weakness (generalized): Secondary | ICD-10-CM

## 2023-06-19 DIAGNOSIS — R262 Difficulty in walking, not elsewhere classified: Secondary | ICD-10-CM

## 2023-06-19 DIAGNOSIS — R270 Ataxia, unspecified: Secondary | ICD-10-CM

## 2023-06-19 NOTE — Therapy (Signed)
OUTPATIENT PHYSICAL THERAPY LOWER EXTREMITY TREATMENT   Patient Name: Karen Jackson MRN: [id number] DOB:May 26, 1941, 82 y.o., female Today's Date: 06/19/2023  END OF SESSION:  PT End of Session - 06/19/23 1103     Visit Number 9    Date for PT Re-Evaluation 08/21/23    PT Start Time 1100    PT Stop Time 1145    PT Time Calculation (min) 45 min    Activity Tolerance Patient tolerated treatment well    Behavior During Therapy [facility] for tasks assessed/performed             Past Medical History:  Diagnosis Date   Acute cystitis without hematuria    Acute diastolic CHF (congestive heart failure) (HCC)    Arthritis    Dyspnea    Dysrhythmia    Fever of unknown origin 03/19/2017   Hyperlipidemia    Hypertension    denies at preop   Multifocal pneumonia    Neuromuscular disorder (HCC)    neuropathy left arm and foot   Osteopenia    Paralysis (HCC)    partial left side from CVA    Persistent atrial fibrillation (HCC)    PONV (postoperative nausea and vomiting)    Pre-diabetes    Stroke [facility]) 2013   hemmorahgic   Past Surgical History:  Procedure Laterality Date   ANKLE SURGERY     APPENDECTOMY     CHOLECYSTECTOMY     HARDWARE REMOVAL Left 03/29/2023   Procedure: HARDWARE REMOVAL;  Surgeon: Toni Arthurs, MD;  Location: [facility];  Service: Orthopedics;  Laterality: Left;   HERNIA REPAIR     Esophagus   INCISION AND DRAINAGE OF WOUND Left 03/29/2023   Procedure: LEFT ANKLE WOUND IRRIGATION AND DEBRIDEMENT WOUND;  Surgeon: Toni Arthurs, MD;  Location: [facility];  Service: Orthopedics;  Laterality: Left;   JOINT REPLACEMENT     total- right partial- left   MASTECTOMY PARTIAL / LUMPECTOMY  2012   left   ORIF ANKLE FRACTURE Left 07/20/2018   Procedure: OPEN REDUCTION INTERNAL FIXATION (ORIF) ANKLE FRACTURE;  Surgeon: Toni Arthurs, MD;  Location: [facility];  Service: Orthopedics;  Laterality: Left;   TOTAL KNEE ARTHROPLASTY Left 01/27/2019    Procedure: TOTAL KNEE ARTHROPLASTY;  Surgeon: Dannielle Huh, MD;  Location: [facility];  Service: Orthopedics;  Laterality: Left;   Patient Active Problem List   Diagnosis Date Noted   Goals of care, counseling/discussion 01/17/2022   Grief 01/17/2022   Secondary hypercoagulable state (HCC) 03/16/2021   Dizziness 03/10/2021   Orthostatic hypotension 10/15/2020   Presbycusis of both ears 03/08/2020   Mixed stress and urge urinary incontinence 12/02/2019   Macrocytosis 12/01/2019   Nutritional anemia 12/01/2019   S/P total knee replacement 01/27/2019   Recurrent left knee instability 07/05/2018   Respiratory failure with hypoxia (HCC) 08/30/2017   Hypoxemia    Heart failure with preserved ejection fraction (HCC), Grade 3 diastolic dysfunction 03/26/2017   PAF (paroxysmal atrial fibrillation) (HCC)    Dyspnea 03/19/2017   Encounter for preventive health examination 02/17/2016   Sensorineural hearing loss (SNHL), bilateral 01/26/2016   Hypomagnesemia 04/24/2014   Hemiparesis affecting left side as late effect of cerebrovascular accident (HCC) 04/24/2014   Nontraumatic cerebral hemorrhage (HCC) 04/30/2012   DM (diabetes mellitus) with complications (HCC) 03/04/2010   OSTEOPENIA 01/21/2009   UNSPECIFIED VITAMIN D DEFICIENCY 11/19/2007   HYPERCHOLESTEROLEMIA 10/25/2006   GASTROESOPHAGEAL REFLUX, NO ESOPHAGITIS 10/25/2006   DIVERTICULOSIS OF COLON 10/25/2006   Osteoarthritis 10/25/2006  CERVICAL SPINE DISORDER, NOS 10/25/2006    PCP: Waynard Edwards, MD  REFERRING PROVIDER: Waynard Edwards, MD  REFERRING DIAG: s/p left ankle ORIF hardware removal  THERAPY DIAG:  Difficulty in walking, not elsewhere classified  Muscle weakness (generalized)  Hemiplegia and hemiparesis following cerebral infarction affecting left non-dominant side (HCC)  Ataxia  Pain in left ankle and joints of left foot  Late effects of CVA (cerebrovascular accident)  Rationale for Evaluation and Treatment:  Rehabilitation  ONSET DATE: 03/29/23  SUBJECTIVE:   SUBJECTIVE STATEMENT: Patient underwent left ankle ORIF hardware removal on 03/29/23 due to infection.  Woke up at 4AM this morning  PERTINENT HISTORY: CVA, CHF, HTN, A-fib, TKA PAIN:  Are you having pain? Yes: NPRS scale: 7/10 Pain location: left upper trap and neck, left ankle and knee  Pain description: ache sore Aggravating factors: neck and shoulder always hurt, knee and ankle hurt with transfers Relieving factors: massage, heat  PRECAUTIONS: None  RED FLAGS: None   WEIGHT BEARING RESTRICTIONS: No  FALLS:  Has patient fallen in last 6 months? No  LIVING ENVIRONMENT: Lives with: lives alone Lives in: House/apartment Stairs: No Has following equipment at home: Environmental consultant - 2 wheeled, Wheelchair (manual), shower chair, bed side commode, Grab bars, and Ramped entry  OCCUPATION: retired  PLOF: Needs assistance with homemaking, Needs assistance with transfers, and Leisure: plays bridge, she has 11 hours of an aide at home that helps with meals, going to MD's, dressing and bathing, is alone at night  PATIENT GOALS: walk, have less pain, transfer without difficulty  NEXT MD VISIT: none scheduled  OBJECTIVE:   DIAGNOSTIC FINDINGS: none performed  COGNITION: Overall cognitive status: Within functional limits for tasks assessed     SENSATION: WFL POSTURE: rounded shoulders and forward head  PALPATION: Left knee and ankle are swollen and tender, left upper and neck  LOWER EXTREMITY ROM:  Active ROM Right eval Left eval  Hip flexion    Hip extension    Hip abduction    Hip adduction    Hip internal rotation    Hip external rotation    Knee flexion  90  Knee extension  0  Ankle dorsiflexion  5  Ankle plantarflexion  30  Ankle inversion  5  Ankle eversion  0   (Blank rows = not tested)  LOWER EXTREMITY MMT:  MMT Right eval Left eval  Hip flexion 4- 3+  Hip extension    Hip abduction 4 3+  Hip adduction  4 3+  Hip internal rotation    Hip external rotation    Knee flexion 4- 3+  Knee extension 4- 3+  Ankle dorsiflexion    Ankle plantarflexion    Ankle inversion    Ankle eversion     (Blank rows = not tested)  FUNCTIONAL TESTS:  Transfers Max A, left knee gives out and goes into ER and tends to bow into varus, has not walked in about 18 months, and the last time I saw her we could walk about 60 feet with HHA and w/c following  GAIT: Distance walked: unable   TODAY'S TREATMENT:  DATE:  06/19/23 NuStep L 5 x 6 min STM to L UT w/ theragun Green tband HS curls 5# LAQ 5# march Standing march 1lb x5 each Seated on the mat table abdominal work crunches and then some obliques trying to touch elbow to the mat table , volleyball with the right arm Gait x 30 feet with HHA and w/c follow   06/14/23 Transfer with caregiver, did well had the chair farther away from the car and thi made Pencie walk a little more and needed a few cues to make sure she turns all the way to square on the chair, her tendency is to fall into it without moving the left leg back 5# LAQ 5# march Nustep level 5 x 8 minutes Seated on the mat table abdominal work crunches and then some obliques trying to touch elbow to the mat table , volleyball with the right arm Gait x 15' HHA, 30 feet with HHA and w/c follow and then 25'  06/12/23 Nustep level 5 x 8 minutes STM to the left upper trap, neck and rhomboid due to tightness and pain Gait with HHA and w/c follow 25 feet x 2 Standing weight shifts with cues for left knee 3# marches in sitting 3# LAQ Green tband HS curls Red tband ankle PF/DF Red tband hip adduction Red tband hip abduction Transfers with cues for full turn and back up  06/07/23 STM to the upper traps and neck Red tband ankle DF/PF Red tband hip adduction Red tband HS  curls 3# left leg LAQ 3# left leg marches Transfers today with HHA and needs a lot of cues to get all the way to the chair and back up Gait HHA with w/c follow 2x20 feet has to stop due to tenderness in the knee  06/05/23 Nustep level 5 x 7 minutes Red tband left ankle PF/DF STM to the upper traps and the rhomboids 2# LAQ 3x10 2# marches Red tband HS curls Red tband ankle PF/DF Red tband hip adduction Gait with HHA and W/C follow 2x20', cues for foot placement, weight shift and posture   PATIENT EDUCATION:  Education details: POC Person educated: Patient Education method: Explanation Education comprehension: verbalized understanding  HOME EXERCISE PROGRAM: TBD  ASSESSMENT:  CLINICAL IMPRESSION: Patient is a 82 y.o. female who was seen today for physical therapy evaluation and treatment for debility, with recent Covid and ORIF hardware removal of the ankle. Had her walk 51' today, she did well but did report increase fatigue from the standing marches. L hip appears to shift posteriorly when standing.   OBJECTIVE IMPAIRMENTS: Abnormal gait, cardiopulmonary status limiting activity, decreased activity tolerance, decreased balance, decreased coordination, decreased endurance, decreased mobility, difficulty walking, decreased ROM, decreased strength, increased edema, impaired flexibility, improper body mechanics, postural dysfunction, and pain.   REHAB POTENTIAL: Good  CLINICAL DECISION MAKING: Stable/uncomplicated  EVALUATION COMPLEXITY: Low   GOALS: Goals reviewed with patient? Yes  SHORT TERM GOALS: Target date: 06/02/23 Independent with advanced HEP Goal status: met 06/07/23  LONG TERM GOALS: Target date: 08/21/23  Independent with advanced HEP with caregiver Goal status: INITIAL  2.  Transfer with set up and CGA Goal status: INITIAL  3.  Walk HHA x 100 feet Goal status: ongoing 06/14/23  4.  Increase left LE strength to 4/5 Goal status: INITIAL  5.  Report  neck and shoulder pain decreased 50% Goal status: INITIAL  PLAN:  PT FREQUENCY: 1-2x/week  PT DURATION: 12 weeks  PLANNED INTERVENTIONS: Therapeutic exercises, Therapeutic activity,  Neuromuscular re-education, Balance training, Gait training, Patient/Family education, Self Care, Joint mobilization, Moist heat, Taping, and Manual therapy  PLAN FOR NEXT SESSION: advance activities really work on stability of the left knee and her body awareness   Grayce Sessions, PTA 06/19/2023, 11:04 AM

## 2023-06-21 ENCOUNTER — Encounter: Payer: Self-pay | Admitting: Physical Therapy

## 2023-06-21 ENCOUNTER — Ambulatory Visit: Payer: Medicare Other | Admitting: Physical Therapy

## 2023-06-21 DIAGNOSIS — R270 Ataxia, unspecified: Secondary | ICD-10-CM

## 2023-06-21 DIAGNOSIS — R262 Difficulty in walking, not elsewhere classified: Secondary | ICD-10-CM

## 2023-06-21 DIAGNOSIS — I69354 Hemiplegia and hemiparesis following cerebral infarction affecting left non-dominant side: Secondary | ICD-10-CM | POA: Diagnosis not present

## 2023-06-21 DIAGNOSIS — I6990 Unspecified sequelae of unspecified cerebrovascular disease: Secondary | ICD-10-CM | POA: Diagnosis not present

## 2023-06-21 DIAGNOSIS — M6281 Muscle weakness (generalized): Secondary | ICD-10-CM | POA: Diagnosis not present

## 2023-06-21 DIAGNOSIS — M25572 Pain in left ankle and joints of left foot: Secondary | ICD-10-CM | POA: Diagnosis not present

## 2023-06-21 NOTE — Therapy (Signed)
OUTPATIENT PHYSICAL THERAPY LOWER EXTREMITY TREATMENT Progress Note Reporting Period 05/22/23 to 06/21/23  See note below for Objective Data and Assessment of Progress/Goals.      Patient Name: Karen Jackson MRN: [id number] DOB:[birth year], 82 y.o., female Today's Date: 06/21/2023  END OF SESSION:  PT End of Session - 06/21/23 1018     Visit Number 10    Date for PT Re-Evaluation 08/21/23    PT Start Time 1018    PT Stop Time 1100    PT Time Calculation (min) 42 min    Activity Tolerance Patient tolerated treatment well    Behavior During Therapy [facility] for tasks assessed/performed             Past Medical History:  Diagnosis Date   Acute cystitis without hematuria    Acute diastolic CHF (congestive heart failure) (HCC)    Arthritis    Dyspnea    Dysrhythmia    Fever of unknown origin 03/19/2017   Hyperlipidemia    Hypertension    denies at preop   Multifocal pneumonia    Neuromuscular disorder (HCC)    neuropathy left arm and foot   Osteopenia    Paralysis (HCC)    partial left side from CVA    Persistent atrial fibrillation (HCC)    PONV (postoperative nausea and vomiting)    Pre-diabetes    Stroke [facility]) 2013   hemmorahgic   Past Surgical History:  Procedure Laterality Date   ANKLE SURGERY     APPENDECTOMY     CHOLECYSTECTOMY     HARDWARE REMOVAL Left 03/29/2023   Procedure: HARDWARE REMOVAL;  Surgeon: Toni Arthurs, MD;  Location: [facility];  Service: Orthopedics;  Laterality: Left;   HERNIA REPAIR     Esophagus   INCISION AND DRAINAGE OF WOUND Left 03/29/2023   Procedure: LEFT ANKLE WOUND IRRIGATION AND DEBRIDEMENT WOUND;  Surgeon: Toni Arthurs, MD;  Location: [facility];  Service: Orthopedics;  Laterality: Left;   JOINT REPLACEMENT     total- right partial- left   MASTECTOMY PARTIAL / LUMPECTOMY  2012   left   ORIF ANKLE FRACTURE Left 07/20/2018   Procedure: OPEN REDUCTION INTERNAL FIXATION (ORIF) ANKLE FRACTURE;  Surgeon:  Toni Arthurs, MD;  Location: [facility];  Service: Orthopedics;  Laterality: Left;   TOTAL KNEE ARTHROPLASTY Left 01/27/2019   Procedure: TOTAL KNEE ARTHROPLASTY;  Surgeon: Dannielle Huh, MD;  Location: [facility];  Service: Orthopedics;  Laterality: Left;   Patient Active Problem List   Diagnosis Date Noted   Goals of care, counseling/discussion 01/17/2022   Grief 01/17/2022   Secondary hypercoagulable state (HCC) 03/16/2021   Dizziness 03/10/2021   Orthostatic hypotension 10/15/2020   Presbycusis of both ears 03/08/2020   Mixed stress and urge urinary incontinence 12/02/2019   Macrocytosis 12/01/2019   Nutritional anemia 12/01/2019   S/P total knee replacement 01/27/2019   Recurrent left knee instability 07/05/2018   Respiratory failure with hypoxia (HCC) 08/30/2017   Hypoxemia    Heart failure with preserved ejection fraction (HCC), Grade 3 diastolic dysfunction 03/26/2017   PAF (paroxysmal atrial fibrillation) (HCC)    Dyspnea 03/19/2017   Encounter for preventive health examination 02/17/2016   Sensorineural hearing loss (SNHL), bilateral 01/26/2016   Hypomagnesemia 04/24/2014   Hemiparesis affecting left side as late effect of cerebrovascular accident (HCC) 04/24/2014   Nontraumatic cerebral hemorrhage (HCC) 04/30/2012   DM (diabetes mellitus) with complications (HCC) 03/04/2010   OSTEOPENIA 01/21/2009   UNSPECIFIED VITAMIN D DEFICIENCY 11/19/2007  HYPERCHOLESTEROLEMIA 10/25/2006   GASTROESOPHAGEAL REFLUX, NO ESOPHAGITIS 10/25/2006   DIVERTICULOSIS OF COLON 10/25/2006   Osteoarthritis 10/25/2006   CERVICAL SPINE DISORDER, NOS 10/25/2006    PCP: Waynard Edwards, MD  REFERRING PROVIDER: Waynard Edwards, MD  REFERRING DIAG: s/p left ankle ORIF hardware removal  THERAPY DIAG:  Difficulty in walking, not elsewhere classified  Muscle weakness (generalized)  Hemiplegia and hemiparesis following cerebral infarction affecting left non-dominant side (HCC)  Ataxia  Rationale for Evaluation and  Treatment: Rehabilitation  ONSET DATE: 03/29/23  SUBJECTIVE:   SUBJECTIVE STATEMENT: Patient underwent left ankle ORIF hardware removal on 03/29/23 due to infection.  "I feel good" PERTINENT HISTORY: CVA, CHF, HTN, A-fib, TKA PAIN:  Are you having pain? Yes: NPRS scale: 6/10 Pain location: left upper trap and neck, left ankle and knee  Pain description: ache sore Aggravating factors: neck and shoulder always hurt, knee and ankle hurt with transfers Relieving factors: massage, heat  PRECAUTIONS: None  RED FLAGS: None   WEIGHT BEARING RESTRICTIONS: No  FALLS:  Has patient fallen in last 6 months? No  LIVING ENVIRONMENT: Lives with: lives alone Lives in: House/apartment Stairs: No Has following equipment at home: Environmental consultant - 2 wheeled, Wheelchair (manual), shower chair, bed side commode, Grab bars, and Ramped entry  OCCUPATION: retired  PLOF: Needs assistance with homemaking, Needs assistance with transfers, and Leisure: plays bridge, she has 11 hours of an aide at home that helps with meals, going to MD's, dressing and bathing, is alone at night  PATIENT GOALS: walk, have less pain, transfer without difficulty  NEXT MD VISIT: none scheduled  OBJECTIVE:   DIAGNOSTIC FINDINGS: none performed  COGNITION: Overall cognitive status: Within functional limits for tasks assessed     SENSATION: WFL POSTURE: rounded shoulders and forward head  PALPATION: Left knee and ankle are swollen and tender, left upper and neck  LOWER EXTREMITY ROM:  Active ROM Right eval Left eval   Hip flexion     Hip extension     Hip abduction     Hip adduction     Hip internal rotation     Hip external rotation     Knee flexion  90 93  Knee extension  0 0  Ankle dorsiflexion  5   Ankle plantarflexion  30 44  Ankle inversion  5   Ankle eversion  0    (Blank rows = not tested)  LOWER EXTREMITY MMT:  MMT Right eval Left eval Left 06/21/23  Hip flexion 4- 3+ 4-  Hip extension      Hip abduction 4 3+ 4-  Hip adduction 4 3+ 4-  Hip internal rotation     Hip external rotation     Knee flexion 4- 3+ 4  Knee extension 4- 3+ 4-  Ankle dorsiflexion     Ankle plantarflexion     Ankle inversion     Ankle eversion      (Blank rows = not tested)  FUNCTIONAL TESTS:  Transfers Max A, left knee gives out and goes into ER and tends to bow into varus, has not walked in about 18 months, and the last time I saw her we could walk about 60 feet with HHA and w/c following  GAIT: Distance walked: unable   TODAY'S TREATMENT:  DATE:  06/21/23 Approximation L hand  NuStep L 5 x 6 min Green tband HS curls 2# LAQ STM to L UT w/ theragun Gait x 30 feet with HHA and w/c follow   06/19/23 NuStep L 5 x 6 min STM to L UT w/ theragun Green tband HS curls 5# LAQ 5# march Standing march 1lb x5 each Seated on the mat table abdominal work crunches and then some obliques trying to touch elbow to the mat table , volleyball with the right arm Gait x 30 feet with HHA and w/c follow   06/14/23 Transfer with caregiver, did well had the chair farther away from the car and thi made Ashani walk a little more and needed a few cues to make sure she turns all the way to square on the chair, her tendency is to fall into it without moving the left leg back 5# LAQ 5# march Nustep level 5 x 8 minutes Seated on the mat table abdominal work crunches and then some obliques trying to touch elbow to the mat table , volleyball with the right arm Gait x 15' HHA, 30 feet with HHA and w/c follow and then 25'  06/12/23 Nustep level 5 x 8 minutes STM to the left upper trap, neck and rhomboid due to tightness and pain Gait with HHA and w/c follow 25 feet x 2 Standing weight shifts with cues for left knee 3# marches in sitting 3# LAQ Green tband HS curls Red tband ankle PF/DF Red  tband hip adduction Red tband hip abduction Transfers with cues for full turn and back up  06/07/23 STM to the upper traps and neck Red tband ankle DF/PF Red tband hip adduction Red tband HS curls 3# left leg LAQ 3# left leg marches Transfers today with HHA and needs a lot of cues to get all the way to the chair and back up Gait HHA with w/c follow 2x20 feet has to stop due to tenderness in the knee  06/05/23 Nustep level 5 x 7 minutes Red tband left ankle PF/DF STM to the upper traps and the rhomboids 2# LAQ 3x10 2# marches Red tband HS curls Red tband ankle PF/DF Red tband hip adduction Gait with HHA and W/C follow 2x20', cues for foot placement, weight shift and posture   PATIENT EDUCATION:  Education details: POC Person educated: Patient Education method: Explanation Education comprehension: verbalized understanding  HOME EXERCISE PROGRAM: TBD  ASSESSMENT:  CLINICAL IMPRESSION: Patient is a 82 y.o. female who was seen today for physical therapy treatment for debility, with recent Covid and ORIF hardware removal of the ankle. She has progressed increasing her L knee flexion and LLE strength. Tissue density noted in L UT that was helped with STM. Cue to hold quad contraction needed with LAQ. Good effort noted during session.  OBJECTIVE IMPAIRMENTS: Abnormal gait, cardiopulmonary status limiting activity, decreased activity tolerance, decreased balance, decreased coordination, decreased endurance, decreased mobility, difficulty walking, decreased ROM, decreased strength, increased edema, impaired flexibility, improper body mechanics, postural dysfunction, and pain.   REHAB POTENTIAL: Good  CLINICAL DECISION MAKING: Stable/uncomplicated  EVALUATION COMPLEXITY: Low   GOALS: Goals reviewed with patient? Yes  SHORT TERM GOALS: Target date: 06/02/23 Independent with advanced HEP Goal status: met 06/07/23  LONG TERM GOALS: Target date: 08/21/23  Independent with  advanced HEP with caregiver Goal status: INITIAL  2.  Transfer with set up and CGA Goal status: Ongoing 06/21/23  3.  Walk HHA x 100 feet Goal status: Progressing 06/21/23  4.  Increase left LE strength to 4/5 Goal status: Progressing 06/21/23  5.  Report neck and shoulder pain decreased 50% Goal status: Ongoing   PLAN:  PT FREQUENCY: 1-2x/week  PT DURATION: 12 weeks  PLANNED INTERVENTIONS: Therapeutic exercises, Therapeutic activity, Neuromuscular re-education, Balance training, Gait training, Patient/Family education, Self Care, Joint mobilization, Moist heat, Taping, and Manual therapy  PLAN FOR NEXT SESSION: advance activities really work on stability of the left knee and her body awareness   Grayce Sessions, PTA 06/21/2023, 10:24 AM

## 2023-06-26 ENCOUNTER — Ambulatory Visit: Payer: Medicare Other | Admitting: Physical Therapy

## 2023-06-26 DIAGNOSIS — M25572 Pain in left ankle and joints of left foot: Secondary | ICD-10-CM | POA: Diagnosis not present

## 2023-06-26 DIAGNOSIS — M6281 Muscle weakness (generalized): Secondary | ICD-10-CM

## 2023-06-26 DIAGNOSIS — I6990 Unspecified sequelae of unspecified cerebrovascular disease: Secondary | ICD-10-CM

## 2023-06-26 DIAGNOSIS — I69354 Hemiplegia and hemiparesis following cerebral infarction affecting left non-dominant side: Secondary | ICD-10-CM | POA: Diagnosis not present

## 2023-06-26 DIAGNOSIS — R270 Ataxia, unspecified: Secondary | ICD-10-CM

## 2023-06-26 DIAGNOSIS — R262 Difficulty in walking, not elsewhere classified: Secondary | ICD-10-CM | POA: Diagnosis not present

## 2023-06-26 NOTE — Therapy (Signed)
OUTPATIENT PHYSICAL THERAPY LOWER EXTREMITY TREATMENT        Patient Name: Karen Jackson MRN: [id number] DOB:1941-06-05, 82 y.o., female Today's Date: 06/26/2023  END OF SESSION:  PT End of Session - 06/26/23 1045     Visit Number 11    Date for PT Re-Evaluation 08/21/23    Authorization Type Medicare    PT Start Time 1100    PT Stop Time 1145    PT Time Calculation (min) 45 min             Past Medical History:  Diagnosis Date   Acute cystitis without hematuria    Acute diastolic CHF (congestive heart failure) (HCC)    Arthritis    Dyspnea    Dysrhythmia    Fever of unknown origin 03/19/2017   Hyperlipidemia    Hypertension    denies at preop   Multifocal pneumonia    Neuromuscular disorder (HCC)    neuropathy left arm and foot   Osteopenia    Paralysis (HCC)    partial left side from CVA    Persistent atrial fibrillation (HCC)    PONV (postoperative nausea and vomiting)    Pre-diabetes    Stroke [facility]) 2013   hemmorahgic   Past Surgical History:  Procedure Laterality Date   ANKLE SURGERY     APPENDECTOMY     CHOLECYSTECTOMY     HARDWARE REMOVAL Left 03/29/2023   Procedure: HARDWARE REMOVAL;  Surgeon: Toni Arthurs, MD;  Location: [facility];  Service: Orthopedics;  Laterality: Left;   HERNIA REPAIR     Esophagus   INCISION AND DRAINAGE OF WOUND Left 03/29/2023   Procedure: LEFT ANKLE WOUND IRRIGATION AND DEBRIDEMENT WOUND;  Surgeon: Toni Arthurs, MD;  Location: [facility];  Service: Orthopedics;  Laterality: Left;   JOINT REPLACEMENT     total- right partial- left   MASTECTOMY PARTIAL / LUMPECTOMY  2012   left   ORIF ANKLE FRACTURE Left 07/20/2018   Procedure: OPEN REDUCTION INTERNAL FIXATION (ORIF) ANKLE FRACTURE;  Surgeon: Toni Arthurs, MD;  Location: [facility];  Service: Orthopedics;  Laterality: Left;   TOTAL KNEE ARTHROPLASTY Left 01/27/2019   Procedure: TOTAL KNEE ARTHROPLASTY;  Surgeon: Dannielle Huh, MD;  Location: [facility];   Service: Orthopedics;  Laterality: Left;   Patient Active Problem List   Diagnosis Date Noted   Goals of care, counseling/discussion 01/17/2022   Grief 01/17/2022   Secondary hypercoagulable state (HCC) 03/16/2021   Dizziness 03/10/2021   Orthostatic hypotension 10/15/2020   Presbycusis of both ears 03/08/2020   Mixed stress and urge urinary incontinence 12/02/2019   Macrocytosis 12/01/2019   Nutritional anemia 12/01/2019   S/P total knee replacement 01/27/2019   Recurrent left knee instability 07/05/2018   Respiratory failure with hypoxia (HCC) 08/30/2017   Hypoxemia    Heart failure with preserved ejection fraction (HCC), Grade 3 diastolic dysfunction 03/26/2017   PAF (paroxysmal atrial fibrillation) (HCC)    Dyspnea 03/19/2017   Encounter for preventive health examination 02/17/2016   Sensorineural hearing loss (SNHL), bilateral 01/26/2016   Hypomagnesemia 04/24/2014   Hemiparesis affecting left side as late effect of cerebrovascular accident (HCC) 04/24/2014   Nontraumatic cerebral hemorrhage (HCC) 04/30/2012   DM (diabetes mellitus) with complications (HCC) 03/04/2010   OSTEOPENIA 01/21/2009   UNSPECIFIED VITAMIN D DEFICIENCY 11/19/2007   HYPERCHOLESTEROLEMIA 10/25/2006   GASTROESOPHAGEAL REFLUX, NO ESOPHAGITIS 10/25/2006   DIVERTICULOSIS OF COLON 10/25/2006   Osteoarthritis 10/25/2006   CERVICAL SPINE DISORDER, NOS 10/25/2006  Increase left LE strength to 4/5 Goal status: Progressing 06/21/23  5.  Report neck and shoulder pain decreased 50% Goal status: Ongoing   PLAN:  PT FREQUENCY: 1-2x/week  PT DURATION: 12 weeks  PLANNED INTERVENTIONS: Therapeutic exercises, Therapeutic activity, Neuromuscular re-education, Balance training, Gait training, Patient/Family education, Self Care, Joint mobilization, Moist heat, Taping, and Manual therapy  PLAN FOR NEXT SESSION: advance activities really work on stability of the left knee and her body awareness   Nuvia Hileman,ANGIE, PTA 06/26/2023, 10:46 AM [facility] [address] [facility] [place], [place], [address] Phone: [phone]   Fax:  [phone]  Patient Details  Name: Karen Jackson MRN: [id number] Date of Birth: February 26, 1941 Referring Provider:  Rodrigo Ran, MD  Encounter Date: 06/26/2023   Suanne Marker, PTA 06/26/2023, 10:46 AM  [facility] [address] [place], [place], [address] Phone: [phone]   Fax:  [phone]  Increase left LE strength to 4/5 Goal status: Progressing 06/21/23  5.  Report neck and shoulder pain decreased 50% Goal status: Ongoing   PLAN:  PT FREQUENCY: 1-2x/week  PT DURATION: 12 weeks  PLANNED INTERVENTIONS: Therapeutic exercises, Therapeutic activity, Neuromuscular re-education, Balance training, Gait training, Patient/Family education, Self Care, Joint mobilization, Moist heat, Taping, and Manual therapy  PLAN FOR NEXT SESSION: advance activities really work on stability of the left knee and her body awareness   Nuvia Hileman,ANGIE, PTA 06/26/2023, 10:46 AM [facility] [address] [facility] [place], [place], [address] Phone: [phone]   Fax:  [phone]  Patient Details  Name: Karen Jackson MRN: [id number] Date of Birth: February 26, 1941 Referring Provider:  Rodrigo Ran, MD  Encounter Date: 06/26/2023   Suanne Marker, PTA 06/26/2023, 10:46 AM  [facility] [address] [place], [place], [address] Phone: [phone]   Fax:  [phone]  Increase left LE strength to 4/5 Goal status: Progressing 06/21/23  5.  Report neck and shoulder pain decreased 50% Goal status: Ongoing   PLAN:  PT FREQUENCY: 1-2x/week  PT DURATION: 12 weeks  PLANNED INTERVENTIONS: Therapeutic exercises, Therapeutic activity, Neuromuscular re-education, Balance training, Gait training, Patient/Family education, Self Care, Joint mobilization, Moist heat, Taping, and Manual therapy  PLAN FOR NEXT SESSION: advance activities really work on stability of the left knee and her body awareness   Nuvia Hileman,ANGIE, PTA 06/26/2023, 10:46 AM [facility] [address] [facility]. [place], [place], [address] Phone: [phone]   Fax:  [phone]  Patient Details  Name: Karen Jackson MRN: [id number] Date of Birth: February 26, 1941 Referring Provider:  Rodrigo Ran, MD  Encounter Date: 06/26/2023   Suanne Marker, PTA 06/26/2023, 10:46 AM  [facility] [address] [place], [place], [address] Phone: [phone]   Fax:  [phone]

## 2023-06-28 ENCOUNTER — Encounter: Payer: Self-pay | Admitting: Physical Therapy

## 2023-06-28 ENCOUNTER — Ambulatory Visit: Payer: Medicare Other | Admitting: Physical Therapy

## 2023-06-28 DIAGNOSIS — M25572 Pain in left ankle and joints of left foot: Secondary | ICD-10-CM | POA: Diagnosis not present

## 2023-06-28 DIAGNOSIS — M6281 Muscle weakness (generalized): Secondary | ICD-10-CM

## 2023-06-28 DIAGNOSIS — R270 Ataxia, unspecified: Secondary | ICD-10-CM | POA: Diagnosis not present

## 2023-06-28 DIAGNOSIS — I69354 Hemiplegia and hemiparesis following cerebral infarction affecting left non-dominant side: Secondary | ICD-10-CM

## 2023-06-28 DIAGNOSIS — R262 Difficulty in walking, not elsewhere classified: Secondary | ICD-10-CM

## 2023-06-28 DIAGNOSIS — I6990 Unspecified sequelae of unspecified cerebrovascular disease: Secondary | ICD-10-CM | POA: Diagnosis not present

## 2023-06-28 NOTE — Therapy (Signed)
OUTPATIENT PHYSICAL THERAPY LOWER EXTREMITY TREATMENT        Patient Name: Karen Jackson MRN: [id number] DOB:1940-10-09, 82 y.o., female Today's Date: 06/28/2023  END OF SESSION:  PT End of Session - 06/28/23 1106     Visit Number 12    Date for PT Re-Evaluation 08/21/23    PT Start Time 1100    PT Stop Time 1145    PT Time Calculation (min) 45 min    Activity Tolerance Patient tolerated treatment well    Behavior During Therapy [facility] for tasks assessed/performed             Past Medical History:  Diagnosis Date   Acute cystitis without hematuria    Acute diastolic CHF (congestive heart failure) (HCC)    Arthritis    Dyspnea    Dysrhythmia    Fever of unknown origin 03/19/2017   Hyperlipidemia    Hypertension    denies at preop   Multifocal pneumonia    Neuromuscular disorder (HCC)    neuropathy left arm and foot   Osteopenia    Paralysis (HCC)    partial left side from CVA    Persistent atrial fibrillation (HCC)    PONV (postoperative nausea and vomiting)    Pre-diabetes    Stroke [facility]) 2013   hemmorahgic   Past Surgical History:  Procedure Laterality Date   ANKLE SURGERY     APPENDECTOMY     CHOLECYSTECTOMY     HARDWARE REMOVAL Left 03/29/2023   Procedure: HARDWARE REMOVAL;  Surgeon: Toni Arthurs, MD;  Location: [facility];  Service: Orthopedics;  Laterality: Left;   HERNIA REPAIR     Esophagus   INCISION AND DRAINAGE OF WOUND Left 03/29/2023   Procedure: LEFT ANKLE WOUND IRRIGATION AND DEBRIDEMENT WOUND;  Surgeon: Toni Arthurs, MD;  Location: [facility];  Service: Orthopedics;  Laterality: Left;   JOINT REPLACEMENT     total- right partial- left   MASTECTOMY PARTIAL / LUMPECTOMY  2012   left   ORIF ANKLE FRACTURE Left 07/20/2018   Procedure: OPEN REDUCTION INTERNAL FIXATION (ORIF) ANKLE FRACTURE;  Surgeon: Toni Arthurs, MD;  Location: [facility];  Service: Orthopedics;  Laterality: Left;   TOTAL KNEE ARTHROPLASTY Left 01/27/2019    Procedure: TOTAL KNEE ARTHROPLASTY;  Surgeon: Dannielle Huh, MD;  Location: [facility];  Service: Orthopedics;  Laterality: Left;   Patient Active Problem List   Diagnosis Date Noted   Goals of care, counseling/discussion 01/17/2022   Grief 01/17/2022   Secondary hypercoagulable state (HCC) 03/16/2021   Dizziness 03/10/2021   Orthostatic hypotension 10/15/2020   Presbycusis of both ears 03/08/2020   Mixed stress and urge urinary incontinence 12/02/2019   Macrocytosis 12/01/2019   Nutritional anemia 12/01/2019   S/P total knee replacement 01/27/2019   Recurrent left knee instability 07/05/2018   Respiratory failure with hypoxia (HCC) 08/30/2017   Hypoxemia    Heart failure with preserved ejection fraction (HCC), Grade 3 diastolic dysfunction 03/26/2017   PAF (paroxysmal atrial fibrillation) (HCC)    Dyspnea 03/19/2017   Encounter for preventive health examination 02/17/2016   Sensorineural hearing loss (SNHL), bilateral 01/26/2016   Hypomagnesemia 04/24/2014   Hemiparesis affecting left side as late effect of cerebrovascular accident (HCC) 04/24/2014   Nontraumatic cerebral hemorrhage (HCC) 04/30/2012   DM (diabetes mellitus) with complications (HCC) 03/04/2010   OSTEOPENIA 01/21/2009   UNSPECIFIED VITAMIN D DEFICIENCY 11/19/2007   HYPERCHOLESTEROLEMIA 10/25/2006   GASTROESOPHAGEAL REFLUX, NO ESOPHAGITIS 10/25/2006   DIVERTICULOSIS OF COLON 10/25/2006  Osteoarthritis 10/25/2006   CERVICAL SPINE DISORDER, NOS 10/25/2006    PCP: Waynard Edwards, MD  REFERRING PROVIDER: Waynard Edwards, MD  REFERRING DIAG: s/p left ankle ORIF hardware removal  THERAPY DIAG:  Difficulty in walking, not elsewhere classified  Muscle weakness (generalized)  Hemiplegia and hemiparesis following cerebral infarction affecting left non-dominant side (HCC)  Rationale for Evaluation and Treatment: Rehabilitation  ONSET DATE: 03/29/23  SUBJECTIVE:   SUBJECTIVE STATEMENT: Feeling pretty good, shoulder is of course  sore Doing okay    PERTINENT HISTORY: CVA, CHF, HTN, A-fib, TKA PAIN:  Are you having pain? Yes: NPRS scale: 6/10 Pain location: left upper trap and neck, left ankle and knee  Pain description: ache sore Aggravating factors: neck and shoulder always hurt, knee and ankle hurt with transfers Relieving factors: massage, heat  PRECAUTIONS: None  RED FLAGS: None   WEIGHT BEARING RESTRICTIONS: No  FALLS:  Has patient fallen in last 6 months? No  LIVING ENVIRONMENT: Lives with: lives alone Lives in: House/apartment Stairs: No Has following equipment at home: Environmental consultant - 2 wheeled, Wheelchair (manual), shower chair, bed side commode, Grab bars, and Ramped entry  OCCUPATION: retired  PLOF: Needs assistance with homemaking, Needs assistance with transfers, and Leisure: plays bridge, she has 11 hours of an aide at home that helps with meals, going to MD's, dressing and bathing, is alone at night  PATIENT GOALS: walk, have less pain, transfer without difficulty  NEXT MD VISIT: none scheduled  OBJECTIVE:   DIAGNOSTIC FINDINGS: none performed  COGNITION: Overall cognitive status: Within functional limits for tasks assessed     SENSATION: WFL POSTURE: rounded shoulders and forward head  PALPATION: Left knee and ankle are swollen and tender, left upper and neck  LOWER EXTREMITY ROM:  Active ROM Right eval Left eval   Hip flexion     Hip extension     Hip abduction     Hip adduction     Hip internal rotation     Hip external rotation     Knee flexion  90 93  Knee extension  0 0  Ankle dorsiflexion  5   Ankle plantarflexion  30 44  Ankle inversion  5   Ankle eversion  0    (Blank rows = not tested)  LOWER EXTREMITY MMT:  MMT Right eval Left eval Left 06/21/23  Hip flexion 4- 3+ 4-  Hip extension     Hip abduction 4 3+ 4-  Hip adduction 4 3+ 4-  Hip internal rotation     Hip external rotation     Knee flexion 4- 3+ 4  Knee extension 4- 3+ 4-  Ankle  dorsiflexion     Ankle plantarflexion     Ankle inversion     Ankle eversion      (Blank rows = not tested)  FUNCTIONAL TESTS:  Transfers Max A, left knee gives out and goes into ER and tends to bow into varus, has not walked in about 18 months, and the last time I saw her we could walk about 60 feet with HHA and w/c following  GAIT: Distance walked: unable   TODAY'S TREATMENT:  DATE:  06/28/23 Nustep L 5 Approximation L hand  STM to L UT w/ theragun Green tband HS curls 2 sets 10 5# LAQ 2 sets 12 5# hip flex 2 sets 10 ADD ball squeeze Green tband clams  Gait x 30 feet with HHA and w/c follow 2 x  06/26/23 Nustep L 5 5# LAQ 2 sets 10 5# hip flex 2 sets 10 Green tband HS curls 2 sets 10 Green tband clams  ADD ball squeeze STM to L UT and left hand approximation Gait x 30 feet with HHA and w/c follow 2 x    06/21/23 Approximation L hand  NuStep L 5 x 6 min Green tband HS curls 2# LAQ STM to L UT w/ theragun Gait x 30 feet with HHA and w/c follow   06/19/23 NuStep L 5 x 6 min STM to L UT w/ theragun Green tband HS curls 5# LAQ 5# march Standing march 1lb x5 each Seated on the mat table abdominal work crunches and then some obliques trying to touch elbow to the mat table , volleyball with the right arm Gait x 30 feet with HHA and w/c follow   06/14/23 Transfer with caregiver, did well had the chair farther away from the car and thi made Zianna walk a little more and needed a few cues to make sure she turns all the way to square on the chair, her tendency is to fall into it without moving the left leg back 5# LAQ 5# march Nustep level 5 x 8 minutes Seated on the mat table abdominal work crunches and then some obliques trying to touch elbow to the mat table , volleyball with the right arm Gait x 15' HHA, 30 feet with HHA and w/c  follow and then 25'  06/12/23 Nustep level 5 x 8 minutes STM to the left upper trap, neck and rhomboid due to tightness and pain Gait with HHA and w/c follow 25 feet x 2 Standing weight shifts with cues for left knee 3# marches in sitting 3# LAQ Green tband HS curls Red tband ankle PF/DF Red tband hip adduction Red tband hip abduction Transfers with cues for full turn and back up  06/07/23 STM to the upper traps and neck Red tband ankle DF/PF Red tband hip adduction Red tband HS curls 3# left leg LAQ 3# left leg marches Transfers today with HHA and needs a lot of cues to get all the way to the chair and back up Gait HHA with w/c follow 2x20 feet has to stop due to tenderness in the knee  06/05/23 Nustep level 5 x 7 minutes Red tband left ankle PF/DF STM to the upper traps and the rhomboids 2# LAQ 3x10 2# marches Red tband HS curls Red tband ankle PF/DF Red tband hip adduction Gait with HHA and W/C follow 2x20', cues for foot placement, weight shift and posture   PATIENT EDUCATION:  Education details: POC Person educated: Patient Education method: Explanation Education comprehension: verbalized understanding  HOME EXERCISE PROGRAM: TBD  ASSESSMENT:  CLINICAL IMPRESSION: Continued progression strength and gait. Assistance and cuing needed with some interventions. LLE is weakness than the R. OBJECTIVE IMPAIRMENTS: Abnormal gait, cardiopulmonary status limiting activity, decreased activity tolerance, decreased balance, decreased coordination, decreased endurance, decreased mobility, difficulty walking, decreased ROM, decreased strength, increased edema, impaired flexibility, improper body mechanics, postural dysfunction, and pain.   REHAB POTENTIAL: Good  CLINICAL DECISION MAKING: Stable/uncomplicated  EVALUATION COMPLEXITY: Low   GOALS: Goals reviewed with patient?  Yes  SHORT TERM GOALS: Target date: 06/02/23 Independent with advanced HEP Goal status: met  06/07/23  LONG TERM GOALS: Target date: 08/21/23  Independent with advanced HEP with caregiver Goal status: INITIAL  2.  Transfer with set up and CGA Goal status: Ongoing 06/21/23  3.  Walk HHA x 100 feet Goal status: Progressing 06/21/23  4.  Increase left LE strength to 4/5 Goal status: Progressing 06/21/23  5.  Report neck and shoulder pain decreased 50% Goal status: Ongoing   PLAN:  PT FREQUENCY: 1-2x/week  PT DURATION: 12 weeks  PLANNED INTERVENTIONS: Therapeutic exercises, Therapeutic activity, Neuromuscular re-education, Balance training, Gait training, Patient/Family education, Self Care, Joint mobilization, Moist heat, Taping, and Manual therapy  PLAN FOR NEXT SESSION: advance activities really work on stability of the left knee and her body awareness   Grayce Sessions, PTA

## 2023-07-03 ENCOUNTER — Ambulatory Visit: Payer: Medicare Other | Attending: Internal Medicine | Admitting: Physical Therapy

## 2023-07-03 ENCOUNTER — Encounter: Payer: Self-pay | Admitting: Physical Therapy

## 2023-07-03 DIAGNOSIS — M25512 Pain in left shoulder: Secondary | ICD-10-CM | POA: Diagnosis not present

## 2023-07-03 DIAGNOSIS — M25572 Pain in left ankle and joints of left foot: Secondary | ICD-10-CM | POA: Insufficient documentation

## 2023-07-03 DIAGNOSIS — M6281 Muscle weakness (generalized): Secondary | ICD-10-CM | POA: Diagnosis not present

## 2023-07-03 DIAGNOSIS — R270 Ataxia, unspecified: Secondary | ICD-10-CM | POA: Diagnosis not present

## 2023-07-03 DIAGNOSIS — R293 Abnormal posture: Secondary | ICD-10-CM | POA: Insufficient documentation

## 2023-07-03 DIAGNOSIS — R262 Difficulty in walking, not elsewhere classified: Secondary | ICD-10-CM | POA: Insufficient documentation

## 2023-07-03 DIAGNOSIS — M542 Cervicalgia: Secondary | ICD-10-CM | POA: Diagnosis not present

## 2023-07-03 DIAGNOSIS — I69354 Hemiplegia and hemiparesis following cerebral infarction affecting left non-dominant side: Secondary | ICD-10-CM | POA: Insufficient documentation

## 2023-07-03 DIAGNOSIS — I6990 Unspecified sequelae of unspecified cerebrovascular disease: Secondary | ICD-10-CM | POA: Insufficient documentation

## 2023-07-03 NOTE — Therapy (Signed)
OUTPATIENT PHYSICAL THERAPY LOWER EXTREMITY TREATMENT        Patient Name: Karen Jackson MRN: [id number] DOB:[birth year], 82 y.o., female Today's Date: 07/03/2023  END OF SESSION:  PT End of Session - 07/03/23 1024     Visit Number 13    Date for PT Re-Evaluation 08/21/23    Authorization Type Medicare    Activity Tolerance Patient tolerated treatment well    Behavior During Therapy [facility] for tasks assessed/performed             Past Medical History:  Diagnosis Date   Acute cystitis without hematuria    Acute diastolic CHF (congestive heart failure) (HCC)    Arthritis    Dyspnea    Dysrhythmia    Fever of unknown origin 03/19/2017   Hyperlipidemia    Hypertension    denies at preop   Multifocal pneumonia    Neuromuscular disorder (HCC)    neuropathy left arm and foot   Osteopenia    Paralysis (HCC)    partial left side from CVA    Persistent atrial fibrillation (HCC)    PONV (postoperative nausea and vomiting)    Pre-diabetes    Stroke [facility]) 2013   hemmorahgic   Past Surgical History:  Procedure Laterality Date   ANKLE SURGERY     APPENDECTOMY     CHOLECYSTECTOMY     HARDWARE REMOVAL Left 03/29/2023   Procedure: HARDWARE REMOVAL;  Surgeon: Toni Arthurs, MD;  Location: [facility];  Service: Orthopedics;  Laterality: Left;   HERNIA REPAIR     Esophagus   INCISION AND DRAINAGE OF WOUND Left 03/29/2023   Procedure: LEFT ANKLE WOUND IRRIGATION AND DEBRIDEMENT WOUND;  Surgeon: Toni Arthurs, MD;  Location: [facility];  Service: Orthopedics;  Laterality: Left;   JOINT REPLACEMENT     total- right partial- left   MASTECTOMY PARTIAL / LUMPECTOMY  2012   left   ORIF ANKLE FRACTURE Left 07/20/2018   Procedure: OPEN REDUCTION INTERNAL FIXATION (ORIF) ANKLE FRACTURE;  Surgeon: Toni Arthurs, MD;  Location: [facility];  Service: Orthopedics;  Laterality: Left;   TOTAL KNEE ARTHROPLASTY Left 01/27/2019   Procedure: TOTAL KNEE ARTHROPLASTY;  Surgeon:  Dannielle Huh, MD;  Location: [facility];  Service: Orthopedics;  Laterality: Left;   Patient Active Problem List   Diagnosis Date Noted   Goals of care, counseling/discussion 01/17/2022   Grief 01/17/2022   Secondary hypercoagulable state (HCC) 03/16/2021   Dizziness 03/10/2021   Orthostatic hypotension 10/15/2020   Presbycusis of both ears 03/08/2020   Mixed stress and urge urinary incontinence 12/02/2019   Macrocytosis 12/01/2019   Nutritional anemia 12/01/2019   S/P total knee replacement 01/27/2019   Recurrent left knee instability 07/05/2018   Respiratory failure with hypoxia (HCC) 08/30/2017   Hypoxemia    Heart failure with preserved ejection fraction (HCC), Grade 3 diastolic dysfunction 03/26/2017   PAF (paroxysmal atrial fibrillation) (HCC)    Dyspnea 03/19/2017   Encounter for preventive health examination 02/17/2016   Sensorineural hearing loss (SNHL), bilateral 01/26/2016   Hypomagnesemia 04/24/2014   Hemiparesis affecting left side as late effect of cerebrovascular accident (HCC) 04/24/2014   Nontraumatic cerebral hemorrhage (HCC) 04/30/2012   DM (diabetes mellitus) with complications (HCC) 03/04/2010   OSTEOPENIA 01/21/2009   UNSPECIFIED VITAMIN D DEFICIENCY 11/19/2007   HYPERCHOLESTEROLEMIA 10/25/2006   GASTROESOPHAGEAL REFLUX, NO ESOPHAGITIS 10/25/2006   DIVERTICULOSIS OF COLON 10/25/2006   Osteoarthritis 10/25/2006   CERVICAL SPINE DISORDER, NOS 10/25/2006    PCP: Waynard Edwards, MD  REFERRING PROVIDER: Waynard Edwards, MD  REFERRING DIAG: s/p left ankle ORIF hardware removal  THERAPY DIAG:  Difficulty in walking, not elsewhere classified  Muscle weakness (generalized)  Hemiplegia and hemiparesis following cerebral infarction affecting left non-dominant side (HCC)  Ataxia  Pain in left ankle and joints of left foot  Late effects of CVA (cerebrovascular accident)  Cervicalgia  Left shoulder pain, unspecified chronicity  Abnormal posture  Rationale for Evaluation  and Treatment: Rehabilitation  ONSET DATE: 03/29/23  SUBJECTIVE:   SUBJECTIVE STATEMENT: Reports that she went to a wedding this weekend, was very tired but reports that she feels like she did a good job with transfers  PERTINENT HISTORY: CVA, CHF, HTN, A-fib, TKA PAIN:  Are you having pain? Yes: NPRS scale: 6/10 Pain location: left upper trap and neck, left ankle and knee  Pain description: ache sore Aggravating factors: neck and shoulder always hurt, knee and ankle hurt with transfers Relieving factors: massage, heat  PRECAUTIONS: None  RED FLAGS: None   WEIGHT BEARING RESTRICTIONS: No  FALLS:  Has patient fallen in last 6 months? No  LIVING ENVIRONMENT: Lives with: lives alone Lives in: House/apartment Stairs: No Has following equipment at home: Environmental consultant - 2 wheeled, Wheelchair (manual), shower chair, bed side commode, Grab bars, and Ramped entry  OCCUPATION: retired  PLOF: Needs assistance with homemaking, Needs assistance with transfers, and Leisure: plays bridge, she has 11 hours of an aide at home that helps with meals, going to MD's, dressing and bathing, is alone at night  PATIENT GOALS: walk, have less pain, transfer without difficulty  NEXT MD VISIT: none scheduled  OBJECTIVE:   DIAGNOSTIC FINDINGS: none performed  COGNITION: Overall cognitive status: Within functional limits for tasks assessed     SENSATION: WFL POSTURE: rounded shoulders and forward head  PALPATION: Left knee and ankle are swollen and tender, left upper and neck  LOWER EXTREMITY ROM:  Active ROM Right eval Left eval   Hip flexion     Hip extension     Hip abduction     Hip adduction     Hip internal rotation     Hip external rotation     Knee flexion  90 93  Knee extension  0 0  Ankle dorsiflexion  5   Ankle plantarflexion  30 44  Ankle inversion  5   Ankle eversion  0    (Blank rows = not tested)  LOWER EXTREMITY MMT:  MMT Right eval Left eval Left 06/21/23   Hip flexion 4- 3+ 4-  Hip extension     Hip abduction 4 3+ 4-  Hip adduction 4 3+ 4-  Hip internal rotation     Hip external rotation     Knee flexion 4- 3+ 4  Knee extension 4- 3+ 4-  Ankle dorsiflexion     Ankle plantarflexion     Ankle inversion     Ankle eversion      (Blank rows = not tested)  FUNCTIONAL TESTS:  Transfers Max A, left knee gives out and goes into ER and tends to bow into varus, has not walked in about 18 months, and the last time I saw her we could walk about 60 feet with HHA and w/c following  GAIT: Distance walked: unable   TODAY'S TREATMENT:  DATE:  07/03/23 STM to the neck and upper traps Nustep level 5 x 7 minutes 500 steps Really did some education on the transfers, she tends to start a good turn of the transfer but gets almost all the way and then will not step back and leaves the left foot out in front and kind of falls back into the chair. Red tband left hip adduction Red tband left HS curls Red tband ankle PF/DF 5# LAQ 5# marches Gait with w/c follow 37' x2 Practice transfers  06/28/23 Nustep L 5 Approximation L hand  STM to L UT w/ theragun Green tband HS curls 2 sets 10 5# LAQ 2 sets 12 5# hip flex 2 sets 10 ADD ball squeeze Green tband clams  Gait x 30 feet with HHA and w/c follow 2 x  06/26/23 Nustep L 5 5# LAQ 2 sets 10 5# hip flex 2 sets 10 Green tband HS curls 2 sets 10 Green tband clams  ADD ball squeeze STM to L UT and left hand approximation Gait x 30 feet with HHA and w/c follow 2 x    06/21/23 Approximation L hand  NuStep L 5 x 6 min Green tband HS curls 2# LAQ STM to L UT w/ theragun Gait x 30 feet with HHA and w/c follow   06/19/23 NuStep L 5 x 6 min STM to L UT w/ theragun Green tband HS curls 5# LAQ 5# march Standing march 1lb x5 each Seated on the mat table  abdominal work crunches and then some obliques trying to touch elbow to the mat table , volleyball with the right arm Gait x 30 feet with HHA and w/c follow   06/14/23 Transfer with caregiver, did well had the chair farther away from the car and thi made Sarye walk a little more and needed a few cues to make sure she turns all the way to square on the chair, her tendency is to fall into it without moving the left leg back 5# LAQ 5# march Nustep level 5 x 8 minutes Seated on the mat table abdominal work crunches and then some obliques trying to touch elbow to the mat table , volleyball with the right arm Gait x 15' HHA, 30 feet with HHA and w/c follow and then 25'  06/12/23 Nustep level 5 x 8 minutes STM to the left upper trap, neck and rhomboid due to tightness and pain Gait with HHA and w/c follow 25 feet x 2 Standing weight shifts with cues for left knee 3# marches in sitting 3# LAQ Green tband HS curls Red tband ankle PF/DF Red tband hip adduction Red tband hip abduction Transfers with cues for full turn and back up  06/07/23 STM to the upper traps and neck Red tband ankle DF/PF Red tband hip adduction Red tband HS curls 3# left leg LAQ 3# left leg marches Transfers today with HHA and needs a lot of cues to get all the way to the chair and back up Gait HHA with w/c follow 2x20 feet has to stop due to tenderness in the knee  06/05/23 Nustep level 5 x 7 minutes Red tband left ankle PF/DF STM to the upper traps and the rhomboids 2# LAQ 3x10 2# marches Red tband HS curls Red tband ankle PF/DF Red tband hip adduction Gait with HHA and W/C follow 2x20', cues for foot placement, weight shift and posture   PATIENT EDUCATION:  Education details: POC Person educated: Patient Education method: Hospital doctor  comprehension: verbalized understanding  HOME EXERCISE PROGRAM: TBD  ASSESSMENT:  CLINICAL IMPRESSION: We had a good discussion about the transfers and the  need to take a few extra steps due to the transport chair being light, she tends to get most of the way turned and then sit, what happens is her backside pushes on the chair and if not blocked it will push the chair away as she is very short and this causes her to hit the chair and not be above.  Did well with the walking today 58' OBJECTIVE IMPAIRMENTS: Abnormal gait, cardiopulmonary status limiting activity, decreased activity tolerance, decreased balance, decreased coordination, decreased endurance, decreased mobility, difficulty walking, decreased ROM, decreased strength, increased edema, impaired flexibility, improper body mechanics, postural dysfunction, and pain.   REHAB POTENTIAL: Good  CLINICAL DECISION MAKING: Stable/uncomplicated  EVALUATION COMPLEXITY: Low   GOALS: Goals reviewed with patient? Yes  SHORT TERM GOALS: Target date: 06/02/23 Independent with advanced HEP Goal status: met 06/07/23  LONG TERM GOALS: Target date: 08/21/23  Independent with advanced HEP with caregiver Goal status: INITIAL  2.  Transfer with set up and CGA Goal status: Ongoing 06/21/23  3.  Walk HHA x 100 feet Goal status: Progressing 06/21/23  4.  Increase left LE strength to 4/5 Goal status: Progressing 06/21/23  5.  Report neck and shoulder pain decreased 50% Goal status: Ongoing 07/03/23  PLAN:  PT FREQUENCY: 1-2x/week  PT DURATION: 12 weeks  PLANNED INTERVENTIONS: Therapeutic exercises, Therapeutic activity, Neuromuscular re-education, Balance training, Gait training, Patient/Family education, Self Care, Joint mobilization, Moist heat, Taping, and Manual therapy  PLAN FOR NEXT SESSION: progress functional strength , safe transfers and walking   Jearld Lesch, PT

## 2023-07-05 ENCOUNTER — Ambulatory Visit: Payer: Medicare Other | Admitting: Physical Therapy

## 2023-07-05 ENCOUNTER — Encounter: Payer: Self-pay | Admitting: Physical Therapy

## 2023-07-05 DIAGNOSIS — R270 Ataxia, unspecified: Secondary | ICD-10-CM

## 2023-07-05 DIAGNOSIS — I69354 Hemiplegia and hemiparesis following cerebral infarction affecting left non-dominant side: Secondary | ICD-10-CM | POA: Diagnosis not present

## 2023-07-05 DIAGNOSIS — I6990 Unspecified sequelae of unspecified cerebrovascular disease: Secondary | ICD-10-CM | POA: Diagnosis not present

## 2023-07-05 DIAGNOSIS — R262 Difficulty in walking, not elsewhere classified: Secondary | ICD-10-CM | POA: Diagnosis not present

## 2023-07-05 DIAGNOSIS — M6281 Muscle weakness (generalized): Secondary | ICD-10-CM

## 2023-07-05 DIAGNOSIS — M25572 Pain in left ankle and joints of left foot: Secondary | ICD-10-CM

## 2023-07-05 NOTE — Therapy (Signed)
OUTPATIENT PHYSICAL THERAPY LOWER EXTREMITY TREATMENT        Patient Name: Karen Jackson MRN: [id number] DOB:1941-03-18, 82 y.o., female Today's Date: 07/05/2023  END OF SESSION:  PT End of Session - 07/05/23 1059     Visit Number 14    Date for PT Re-Evaluation 08/21/23    PT Start Time 1100    PT Stop Time 1145    PT Time Calculation (min) 45 min    Activity Tolerance Patient tolerated treatment well    Behavior During Therapy [facility] for tasks assessed/performed             Past Medical History:  Diagnosis Date   Acute cystitis without hematuria    Acute diastolic CHF (congestive heart failure) (HCC)    Arthritis    Dyspnea    Dysrhythmia    Fever of unknown origin 03/19/2017   Hyperlipidemia    Hypertension    denies at preop   Multifocal pneumonia    Neuromuscular disorder (HCC)    neuropathy left arm and foot   Osteopenia    Paralysis (HCC)    partial left side from CVA    Persistent atrial fibrillation (HCC)    PONV (postoperative nausea and vomiting)    Pre-diabetes    Stroke [facility]) 2013   hemmorahgic   Past Surgical History:  Procedure Laterality Date   ANKLE SURGERY     APPENDECTOMY     CHOLECYSTECTOMY     HARDWARE REMOVAL Left 03/29/2023   Procedure: HARDWARE REMOVAL;  Surgeon: Toni Arthurs, MD;  Location: [facility];  Service: Orthopedics;  Laterality: Left;   HERNIA REPAIR     Esophagus   INCISION AND DRAINAGE OF WOUND Left 03/29/2023   Procedure: LEFT ANKLE WOUND IRRIGATION AND DEBRIDEMENT WOUND;  Surgeon: Toni Arthurs, MD;  Location: [facility];  Service: Orthopedics;  Laterality: Left;   JOINT REPLACEMENT     total- right partial- left   MASTECTOMY PARTIAL / LUMPECTOMY  2012   left   ORIF ANKLE FRACTURE Left 07/20/2018   Procedure: OPEN REDUCTION INTERNAL FIXATION (ORIF) ANKLE FRACTURE;  Surgeon: Toni Arthurs, MD;  Location: [facility];  Service: Orthopedics;  Laterality: Left;   TOTAL KNEE ARTHROPLASTY Left 01/27/2019    Procedure: TOTAL KNEE ARTHROPLASTY;  Surgeon: Dannielle Huh, MD;  Location: [facility];  Service: Orthopedics;  Laterality: Left;   Patient Active Problem List   Diagnosis Date Noted   Goals of care, counseling/discussion 01/17/2022   Grief 01/17/2022   Secondary hypercoagulable state (HCC) 03/16/2021   Dizziness 03/10/2021   Orthostatic hypotension 10/15/2020   Presbycusis of both ears 03/08/2020   Mixed stress and urge urinary incontinence 12/02/2019   Macrocytosis 12/01/2019   Nutritional anemia 12/01/2019   S/P total knee replacement 01/27/2019   Recurrent left knee instability 07/05/2018   Respiratory failure with hypoxia (HCC) 08/30/2017   Hypoxemia    Heart failure with preserved ejection fraction (HCC), Grade 3 diastolic dysfunction 03/26/2017   PAF (paroxysmal atrial fibrillation) (HCC)    Dyspnea 03/19/2017   Encounter for preventive health examination 02/17/2016   Sensorineural hearing loss (SNHL), bilateral 01/26/2016   Hypomagnesemia 04/24/2014   Hemiparesis affecting left side as late effect of cerebrovascular accident (HCC) 04/24/2014   Nontraumatic cerebral hemorrhage (HCC) 04/30/2012   DM (diabetes mellitus) with complications (HCC) 03/04/2010   OSTEOPENIA 01/21/2009   UNSPECIFIED VITAMIN D DEFICIENCY 11/19/2007   HYPERCHOLESTEROLEMIA 10/25/2006   GASTROESOPHAGEAL REFLUX, NO ESOPHAGITIS 10/25/2006   DIVERTICULOSIS OF COLON 10/25/2006  Osteoarthritis 10/25/2006   CERVICAL SPINE DISORDER, NOS 10/25/2006    PCP: Waynard Edwards, MD  REFERRING PROVIDER: Waynard Edwards, MD  REFERRING DIAG: s/p left ankle ORIF hardware removal  THERAPY DIAG:  Difficulty in walking, not elsewhere classified  Muscle weakness (generalized)  Hemiplegia and hemiparesis following cerebral infarction affecting left non-dominant side (HCC)  Ataxia  Pain in left ankle and joints of left foot  Rationale for Evaluation and Treatment: Rehabilitation  ONSET DATE: 03/29/23  SUBJECTIVE:   SUBJECTIVE  STATEMENT: "I'm good"  PERTINENT HISTORY: CVA, CHF, HTN, A-fib, TKA PAIN:  Are you having pain? Yes: NPRS scale: 4/10 Pain location: left upper trap and neck, left ankle and knee  Pain description: ache sore Aggravating factors: neck and shoulder always hurt, knee and ankle hurt with transfers Relieving factors: massage, heat  PRECAUTIONS: None  RED FLAGS: None   WEIGHT BEARING RESTRICTIONS: No  FALLS:  Has patient fallen in last 6 months? No  LIVING ENVIRONMENT: Lives with: lives alone Lives in: House/apartment Stairs: No Has following equipment at home: Environmental consultant - 2 wheeled, Wheelchair (manual), shower chair, bed side commode, Grab bars, and Ramped entry  OCCUPATION: retired  PLOF: Needs assistance with homemaking, Needs assistance with transfers, and Leisure: plays bridge, she has 11 hours of an aide at home that helps with meals, going to MD's, dressing and bathing, is alone at night  PATIENT GOALS: walk, have less pain, transfer without difficulty  NEXT MD VISIT: none scheduled  OBJECTIVE:   DIAGNOSTIC FINDINGS: none performed  COGNITION: Overall cognitive status: Within functional limits for tasks assessed     SENSATION: WFL POSTURE: rounded shoulders and forward head  PALPATION: Left knee and ankle are swollen and tender, left upper and neck  LOWER EXTREMITY ROM:  Active ROM Right eval Left eval   Hip flexion     Hip extension     Hip abduction     Hip adduction     Hip internal rotation     Hip external rotation     Knee flexion  90 93  Knee extension  0 0  Ankle dorsiflexion  5   Ankle plantarflexion  30 44  Ankle inversion  5   Ankle eversion  0    (Blank rows = not tested)  LOWER EXTREMITY MMT:  MMT Right eval Left eval Left 06/21/23  Hip flexion 4- 3+ 4-  Hip extension     Hip abduction 4 3+ 4-  Hip adduction 4 3+ 4-  Hip internal rotation     Hip external rotation     Knee flexion 4- 3+ 4  Knee extension 4- 3+ 4-  Ankle  dorsiflexion     Ankle plantarflexion     Ankle inversion     Ankle eversion      (Blank rows = not tested)  FUNCTIONAL TESTS:  Transfers Max A, left knee gives out and goes into ER and tends to bow into varus, has not walked in about 18 months, and the last time I saw her we could walk about 60 feet with HHA and w/c following  GAIT: Distance walked: unable   TODAY'S TREATMENT:  DATE:  07/05/23 STM to the neck and upper traps Nustep level 5 x 7 minutes 500 steps Red tband left hip adduction Red tband left HS curls Red tband ankle PF/D 5# LAQ 5# marches 5# LAQ 5# marches Gait with w/c follow 37' x2   07/03/23 STM to the neck and upper traps Nustep level 5 x 7 minutes 500 steps Really did some education on the transfers, she tends to start a good turn of the transfer but gets almost all the way and then will not step back and leaves the left foot out in front and kind of falls back into the chair. Red tband left hip adduction Red tband left HS curls Red tband ankle PF/DF 5# LAQ 5# marches Gait with w/c follow 37' x2 Practice transfers  06/28/23 Nustep L 5 Approximation L hand  STM to L UT w/ theragun Green tband HS curls 2 sets 10 5# LAQ 2 sets 12 5# hip flex 2 sets 10 ADD ball squeeze Green tband clams  Gait x 30 feet with HHA and w/c follow 2 x  06/26/23 Nustep L 5 5# LAQ 2 sets 10 5# hip flex 2 sets 10 Green tband HS curls 2 sets 10 Green tband clams  ADD ball squeeze STM to L UT and left hand approximation Gait x 30 feet with HHA and w/c follow 2 x    06/21/23 Approximation L hand  NuStep L 5 x 6 min Green tband HS curls 2# LAQ STM to L UT w/ theragun Gait x 30 feet with HHA and w/c follow   06/19/23 NuStep L 5 x 6 min STM to L UT w/ theragun Green tband HS curls 5# LAQ 5# march Standing march 1lb x5  each Seated on the mat table abdominal work crunches and then some obliques trying to touch elbow to the mat table , volleyball with the right arm Gait x 30 feet with HHA and w/c follow   06/14/23 Transfer with caregiver, did well had the chair farther away from the car and thi made Blaike walk a little more and needed a few cues to make sure she turns all the way to square on the chair, her tendency is to fall into it without moving the left leg back 5# LAQ 5# march Nustep level 5 x 8 minutes Seated on the mat table abdominal work crunches and then some obliques trying to touch elbow to the mat table , volleyball with the right arm Gait x 15' HHA, 30 feet with HHA and w/c follow and then 25'  06/12/23 Nustep level 5 x 8 minutes STM to the left upper trap, neck and rhomboid due to tightness and pain Gait with HHA and w/c follow 25 feet x 2 Standing weight shifts with cues for left knee 3# marches in sitting 3# LAQ Green tband HS curls Red tband ankle PF/DF Red tband hip adduction Red tband hip abduction Transfers with cues for full turn and back up  06/07/23 STM to the upper traps and neck Red tband ankle DF/PF Red tband hip adduction Red tband HS curls 3# left leg LAQ 3# left leg marches Transfers today with HHA and needs a lot of cues to get all the way to the chair and back up Gait HHA with w/c follow 2x20 feet has to stop due to tenderness in the knee  06/05/23 Nustep level 5 x 7 minutes Red tband left ankle PF/DF STM to the upper traps and the rhomboids 2#  LAQ 3x10 2# marches Red tband HS curls Red tband ankle PF/DF Red tband hip adduction Gait with HHA and W/C follow 2x20', cues for foot placement, weight shift and posture   PATIENT EDUCATION:  Education details: POC Person educated: Patient Education method: Explanation Education comprehension: verbalized understanding  HOME EXERCISE PROGRAM: TBD  ASSESSMENT:  CLINICAL IMPRESSION: With transfurs pt  get  most of the way turned and then sit, what happens is her backside pushes on the chair and if not blocked it will push the chair away as she is very short and this causes her to hit the chair and not be above. She does this despite cues.  Did well with the walking today 37'. TP noted in the UT that improved with STM. OBJECTIVE IMPAIRMENTS: Abnormal gait, cardiopulmonary status limiting activity, decreased activity tolerance, decreased balance, decreased coordination, decreased endurance, decreased mobility, difficulty walking, decreased ROM, decreased strength, increased edema, impaired flexibility, improper body mechanics, postural dysfunction, and pain.   REHAB POTENTIAL: Good  CLINICAL DECISION MAKING: Stable/uncomplicated  EVALUATION COMPLEXITY: Low   GOALS: Goals reviewed with patient? Yes  SHORT TERM GOALS: Target date: 06/02/23 Independent with advanced HEP Goal status: met 06/07/23  LONG TERM GOALS: Target date: 08/21/23  Independent with advanced HEP with caregiver Goal status: INITIAL  2.  Transfer with set up and CGA Goal status: Ongoing 06/21/23  3.  Walk HHA x 100 feet Goal status: Progressing 06/21/23  4.  Increase left LE strength to 4/5 Goal status: Progressing 06/21/23  5.  Report neck and shoulder pain decreased 50% Goal status: Ongoing 07/03/23  PLAN:  PT FREQUENCY: 1-2x/week  PT DURATION: 12 weeks  PLANNED INTERVENTIONS: Therapeutic exercises, Therapeutic activity, Neuromuscular re-education, Balance training, Gait training, Patient/Family education, Self Care, Joint mobilization, Moist heat, Taping, and Manual therapy  PLAN FOR NEXT SESSION: progress functional strength , safe transfers and walking   Grayce Sessions, PTA

## 2023-07-10 ENCOUNTER — Encounter: Payer: Self-pay | Admitting: Physical Therapy

## 2023-07-10 ENCOUNTER — Ambulatory Visit: Payer: Medicare Other | Admitting: Physical Therapy

## 2023-07-10 DIAGNOSIS — M6281 Muscle weakness (generalized): Secondary | ICD-10-CM | POA: Diagnosis not present

## 2023-07-10 DIAGNOSIS — R262 Difficulty in walking, not elsewhere classified: Secondary | ICD-10-CM | POA: Diagnosis not present

## 2023-07-10 DIAGNOSIS — M25572 Pain in left ankle and joints of left foot: Secondary | ICD-10-CM | POA: Diagnosis not present

## 2023-07-10 DIAGNOSIS — R270 Ataxia, unspecified: Secondary | ICD-10-CM

## 2023-07-10 DIAGNOSIS — I69354 Hemiplegia and hemiparesis following cerebral infarction affecting left non-dominant side: Secondary | ICD-10-CM

## 2023-07-10 DIAGNOSIS — I6990 Unspecified sequelae of unspecified cerebrovascular disease: Secondary | ICD-10-CM | POA: Diagnosis not present

## 2023-07-10 NOTE — Therapy (Signed)
OUTPATIENT PHYSICAL THERAPY LOWER EXTREMITY TREATMENT        Patient Name: Karen Jackson MRN: [id number] DOB:1940/11/25, 82 y.o., female Today's Date: 07/10/2023  END OF SESSION:  PT End of Session - 07/10/23 1019     Visit Number 15    Date for PT Re-Evaluation 08/21/23    PT Start Time 1015    PT Stop Time 1100    PT Time Calculation (min) 45 min    Activity Tolerance Patient tolerated treatment well    Behavior During Therapy [facility] for tasks assessed/performed             Past Medical History:  Diagnosis Date   Acute cystitis without hematuria    Acute diastolic CHF (congestive heart failure) (HCC)    Arthritis    Dyspnea    Dysrhythmia    Fever of unknown origin 03/19/2017   Hyperlipidemia    Hypertension    denies at preop   Multifocal pneumonia    Neuromuscular disorder (HCC)    neuropathy left arm and foot   Osteopenia    Paralysis (HCC)    partial left side from CVA    Persistent atrial fibrillation (HCC)    PONV (postoperative nausea and vomiting)    Pre-diabetes    Stroke [facility]) 2013   hemmorahgic   Past Surgical History:  Procedure Laterality Date   ANKLE SURGERY     APPENDECTOMY     CHOLECYSTECTOMY     HARDWARE REMOVAL Left 03/29/2023   Procedure: HARDWARE REMOVAL;  Surgeon: Toni Arthurs, MD;  Location: [facility];  Service: Orthopedics;  Laterality: Left;   HERNIA REPAIR     Esophagus   INCISION AND DRAINAGE OF WOUND Left 03/29/2023   Procedure: LEFT ANKLE WOUND IRRIGATION AND DEBRIDEMENT WOUND;  Surgeon: Toni Arthurs, MD;  Location: [facility];  Service: Orthopedics;  Laterality: Left;   JOINT REPLACEMENT     total- right partial- left   MASTECTOMY PARTIAL / LUMPECTOMY  2012   left   ORIF ANKLE FRACTURE Left 07/20/2018   Procedure: OPEN REDUCTION INTERNAL FIXATION (ORIF) ANKLE FRACTURE;  Surgeon: Toni Arthurs, MD;  Location: [facility];  Service: Orthopedics;  Laterality: Left;   TOTAL KNEE ARTHROPLASTY Left 01/27/2019    Procedure: TOTAL KNEE ARTHROPLASTY;  Surgeon: Dannielle Huh, MD;  Location: [facility];  Service: Orthopedics;  Laterality: Left;   Patient Active Problem List   Diagnosis Date Noted   Goals of care, counseling/discussion 01/17/2022   Grief 01/17/2022   Secondary hypercoagulable state (HCC) 03/16/2021   Dizziness 03/10/2021   Orthostatic hypotension 10/15/2020   Presbycusis of both ears 03/08/2020   Mixed stress and urge urinary incontinence 12/02/2019   Macrocytosis 12/01/2019   Nutritional anemia 12/01/2019   S/P total knee replacement 01/27/2019   Recurrent left knee instability 07/05/2018   Respiratory failure with hypoxia (HCC) 08/30/2017   Hypoxemia    Heart failure with preserved ejection fraction (HCC), Grade 3 diastolic dysfunction 03/26/2017   PAF (paroxysmal atrial fibrillation) (HCC)    Dyspnea 03/19/2017   Encounter for preventive health examination 02/17/2016   Sensorineural hearing loss (SNHL), bilateral 01/26/2016   Hypomagnesemia 04/24/2014   Hemiparesis affecting left side as late effect of cerebrovascular accident (HCC) 04/24/2014   Nontraumatic cerebral hemorrhage (HCC) 04/30/2012   DM (diabetes mellitus) with complications (HCC) 03/04/2010   OSTEOPENIA 01/21/2009   UNSPECIFIED VITAMIN D DEFICIENCY 11/19/2007   HYPERCHOLESTEROLEMIA 10/25/2006   GASTROESOPHAGEAL REFLUX, NO ESOPHAGITIS 10/25/2006   DIVERTICULOSIS OF COLON 10/25/2006  Osteoarthritis 10/25/2006   CERVICAL SPINE DISORDER, NOS 10/25/2006    PCP: Waynard Edwards, MD  REFERRING PROVIDER: Waynard Edwards, MD  REFERRING DIAG: s/p left ankle ORIF hardware removal  THERAPY DIAG:  Difficulty in walking, not elsewhere classified  Muscle weakness (generalized)  Ataxia  Hemiplegia and hemiparesis following cerebral infarction affecting left non-dominant side (HCC)  Rationale for Evaluation and Treatment: Rehabilitation  ONSET DATE: 03/29/23  SUBJECTIVE:   SUBJECTIVE STATEMENT: Both shoulders are killing her  today  PERTINENT HISTORY: CVA, CHF, HTN, A-fib, TKA PAIN:  Are you having pain? Yes: NPRS scale: 8/10 Pain location: left upper trap and neck, left ankle and knee  Pain description: ache sore Aggravating factors: neck and shoulder always hurt, knee and ankle hurt with transfers Relieving factors: massage, heat  PRECAUTIONS: None  RED FLAGS: None   WEIGHT BEARING RESTRICTIONS: No  FALLS:  Has patient fallen in last 6 months? No  LIVING ENVIRONMENT: Lives with: lives alone Lives in: House/apartment Stairs: No Has following equipment at home: Environmental consultant - 2 wheeled, Wheelchair (manual), shower chair, bed side commode, Grab bars, and Ramped entry  OCCUPATION: retired  PLOF: Needs assistance with homemaking, Needs assistance with transfers, and Leisure: plays bridge, she has 11 hours of an aide at home that helps with meals, going to MD's, dressing and bathing, is alone at night  PATIENT GOALS: walk, have less pain, transfer without difficulty  NEXT MD VISIT: none scheduled  OBJECTIVE:   DIAGNOSTIC FINDINGS: none performed  COGNITION: Overall cognitive status: Within functional limits for tasks assessed     SENSATION: WFL POSTURE: rounded shoulders and forward head  PALPATION: Left knee and ankle are swollen and tender, left upper and neck  LOWER EXTREMITY ROM:  Active ROM Right eval Left eval   Hip flexion     Hip extension     Hip abduction     Hip adduction     Hip internal rotation     Hip external rotation     Knee flexion  90 93  Knee extension  0 0  Ankle dorsiflexion  5   Ankle plantarflexion  30 44  Ankle inversion  5   Ankle eversion  0    (Blank rows = not tested)  LOWER EXTREMITY MMT:  MMT Right eval Left eval Left 06/21/23  Hip flexion 4- 3+ 4-  Hip extension     Hip abduction 4 3+ 4-  Hip adduction 4 3+ 4-  Hip internal rotation     Hip external rotation     Knee flexion 4- 3+ 4  Knee extension 4- 3+ 4-  Ankle dorsiflexion      Ankle plantarflexion     Ankle inversion     Ankle eversion      (Blank rows = not tested)  FUNCTIONAL TESTS:  Transfers Max A, left knee gives out and goes into ER and tends to bow into varus, has not walked in about 18 months, and the last time I saw her we could walk about 60 feet with HHA and w/c following  GAIT: Distance walked: unable   TODAY'S TREATMENT:  DATE:  07/10/23 STM UT, cervical spine, R rhomboids, approximation L digits  NuStep L5 x 7 min 500 steps Red tband left hip adduction Red tband left HS curls Red tband ankle PF/D 5# LAQ 5# marches Gait with w/c follow 37' x2  07/05/23 STM to the neck and upper traps Nustep level 5 x 7 minutes 500 steps Red tband left hip adduction Red tband left HS curls Red tband ankle PF/D 5# LAQ 5# marches 5# LAQ Gait with w/c follow 37' x2   07/03/23 STM to the neck and upper traps Nustep level 5 x 7 minutes 500 steps Really did some education on the transfers, she tends to start a good turn of the transfer but gets almost all the way and then will not step back and leaves the left foot out in front and kind of falls back into the chair. Red tband left hip adduction Red tband left HS curls Red tband ankle PF/DF 5# LAQ 5# marches Gait with w/c follow 37' x2 Practice transfers  06/28/23 Nustep L 5 Approximation L hand  STM to L UT w/ theragun Green tband HS curls 2 sets 10 5# LAQ 2 sets 12 5# hip flex 2 sets 10 ADD ball squeeze Green tband clams  Gait x 30 feet with HHA and w/c follow 2 x   PATIENT EDUCATION:  Education details: POC Person educated: Patient Education method: Explanation Education comprehension: verbalized understanding  HOME EXERCISE PROGRAM: TBD  ASSESSMENT:  CLINICAL IMPRESSION: Pt enters with increase shoulder pain in both UT. This was helped by MT evident  by improved tissue elasticity. With transfurs pt still required to turn nd not sit to quick. She is compliant with cues at times. Did well with the walking with the first 82ft, increase fatigue during the second trial.  OBJECTIVE IMPAIRMENTS: Abnormal gait, cardiopulmonary status limiting activity, decreased activity tolerance, decreased balance, decreased coordination, decreased endurance, decreased mobility, difficulty walking, decreased ROM, decreased strength, increased edema, impaired flexibility, improper body mechanics, postural dysfunction, and pain.   REHAB POTENTIAL: Good  CLINICAL DECISION MAKING: Stable/uncomplicated  EVALUATION COMPLEXITY: Low   GOALS: Goals reviewed with patient? Yes  SHORT TERM GOALS: Target date: 06/02/23 Independent with advanced HEP Goal status: met 06/07/23  LONG TERM GOALS: Target date: 08/21/23  Independent with advanced HEP with caregiver Goal status: INITIAL  2.  Transfer with set up and CGA Goal status: Ongoing 06/21/23  3.  Walk HHA x 100 feet Goal status: Progressing 06/21/23  4.  Increase left LE strength to 4/5 Goal status: Progressing 06/21/23  5.  Report neck and shoulder pain decreased 50% Goal status: Ongoing 07/03/23  PLAN:  PT FREQUENCY: 1-2x/week  PT DURATION: 12 weeks  PLANNED INTERVENTIONS: Therapeutic exercises, Therapeutic activity, Neuromuscular re-education, Balance training, Gait training, Patient/Family education, Self Care, Joint mobilization, Moist heat, Taping, and Manual therapy  PLAN FOR NEXT SESSION: progress functional strength , safe transfers and walking   Grayce Sessions, PTA

## 2023-07-12 ENCOUNTER — Ambulatory Visit: Payer: Medicare Other | Admitting: Physical Therapy

## 2023-07-12 ENCOUNTER — Encounter: Payer: Self-pay | Admitting: Physical Therapy

## 2023-07-12 DIAGNOSIS — R293 Abnormal posture: Secondary | ICD-10-CM

## 2023-07-12 DIAGNOSIS — M25572 Pain in left ankle and joints of left foot: Secondary | ICD-10-CM

## 2023-07-12 DIAGNOSIS — I69354 Hemiplegia and hemiparesis following cerebral infarction affecting left non-dominant side: Secondary | ICD-10-CM | POA: Diagnosis not present

## 2023-07-12 DIAGNOSIS — M6281 Muscle weakness (generalized): Secondary | ICD-10-CM

## 2023-07-12 DIAGNOSIS — R270 Ataxia, unspecified: Secondary | ICD-10-CM

## 2023-07-12 DIAGNOSIS — M25512 Pain in left shoulder: Secondary | ICD-10-CM

## 2023-07-12 DIAGNOSIS — R262 Difficulty in walking, not elsewhere classified: Secondary | ICD-10-CM | POA: Diagnosis not present

## 2023-07-12 DIAGNOSIS — M542 Cervicalgia: Secondary | ICD-10-CM

## 2023-07-12 DIAGNOSIS — I6990 Unspecified sequelae of unspecified cerebrovascular disease: Secondary | ICD-10-CM | POA: Diagnosis not present

## 2023-07-12 NOTE — Therapy (Signed)
OUTPATIENT PHYSICAL THERAPY LOWER EXTREMITY TREATMENT        Patient Name: Karen Jackson MRN: [id number] DOB:1940-12-03, 82 y.o., female Today's Date: 07/12/2023  END OF SESSION:  PT End of Session - 07/12/23 1100     Visit Number 16    Date for PT Re-Evaluation 08/21/23    Authorization Type Medicare    PT Start Time 1057    PT Stop Time 1143    PT Time Calculation (min) 46 min    Activity Tolerance Patient tolerated treatment well    Behavior During Therapy WFL for tasks assessed/performed             Past Medical History:  Diagnosis Date   Acute cystitis without hematuria    Acute diastolic CHF (congestive heart failure) (HCC)    Arthritis    Dyspnea    Dysrhythmia    Fever of unknown origin 03/19/2017   Hyperlipidemia    Hypertension    denies at preop   Multifocal pneumonia    Neuromuscular disorder (HCC)    neuropathy left arm and foot   Osteopenia    Paralysis (HCC)    partial left side from CVA    Persistent atrial fibrillation (HCC)    PONV (postoperative nausea and vomiting)    Pre-diabetes    Stroke [facility]) 2013   hemmorahgic   Past Surgical History:  Procedure Laterality Date   ANKLE SURGERY     APPENDECTOMY     CHOLECYSTECTOMY     HARDWARE REMOVAL Left 03/29/2023   Procedure: HARDWARE REMOVAL;  Surgeon: Toni Arthurs, MD;  Location: [facility];  Service: Orthopedics;  Laterality: Left;   HERNIA REPAIR     Esophagus   INCISION AND DRAINAGE OF WOUND Left 03/29/2023   Procedure: LEFT ANKLE WOUND IRRIGATION AND DEBRIDEMENT WOUND;  Surgeon: Toni Arthurs, MD;  Location: [facility];  Service: Orthopedics;  Laterality: Left;   JOINT REPLACEMENT     total- right partial- left   MASTECTOMY PARTIAL / LUMPECTOMY  2012   left   ORIF ANKLE FRACTURE Left 07/20/2018   Procedure: OPEN REDUCTION INTERNAL FIXATION (ORIF) ANKLE FRACTURE;  Surgeon: Toni Arthurs, MD;  Location: [facility];  Service: Orthopedics;  Laterality: Left;   TOTAL  KNEE ARTHROPLASTY Left 01/27/2019   Procedure: TOTAL KNEE ARTHROPLASTY;  Surgeon: Dannielle Huh, MD;  Location: [facility];  Service: Orthopedics;  Laterality: Left;   Patient Active Problem List   Diagnosis Date Noted   Goals of care, counseling/discussion 01/17/2022   Grief 01/17/2022   Secondary hypercoagulable state (HCC) 03/16/2021   Dizziness 03/10/2021   Orthostatic hypotension 10/15/2020   Presbycusis of both ears 03/08/2020   Mixed stress and urge urinary incontinence 12/02/2019   Macrocytosis 12/01/2019   Nutritional anemia 12/01/2019   S/P total knee replacement 01/27/2019   Recurrent left knee instability 07/05/2018   Respiratory failure with hypoxia (HCC) 08/30/2017   Hypoxemia    Heart failure with preserved ejection fraction (HCC), Grade 3 diastolic dysfunction 03/26/2017   PAF (paroxysmal atrial fibrillation) (HCC)    Dyspnea 03/19/2017   Encounter for preventive health examination 02/17/2016   Sensorineural hearing loss (SNHL), bilateral 01/26/2016   Hypomagnesemia 04/24/2014   Hemiparesis affecting left side as late effect of cerebrovascular accident (HCC) 04/24/2014   Nontraumatic cerebral hemorrhage (HCC) 04/30/2012   DM (diabetes mellitus) with complications (HCC) 03/04/2010   OSTEOPENIA 01/21/2009   UNSPECIFIED VITAMIN D DEFICIENCY 11/19/2007   HYPERCHOLESTEROLEMIA 10/25/2006   GASTROESOPHAGEAL REFLUX, NO ESOPHAGITIS 10/25/2006  DIVERTICULOSIS OF COLON 10/25/2006   Osteoarthritis 10/25/2006   CERVICAL SPINE DISORDER, NOS 10/25/2006    PCP: Waynard Edwards, MD  REFERRING PROVIDER: Waynard Edwards, MD  REFERRING DIAG: s/p left ankle ORIF hardware removal  THERAPY DIAG:  Difficulty in walking, not elsewhere classified  Muscle weakness (generalized)  Ataxia  Hemiplegia and hemiparesis following cerebral infarction affecting left non-dominant side (HCC)  Pain in left ankle and joints of left foot  Late effects of CVA (cerebrovascular accident)  Cervicalgia  Left  shoulder pain, unspecified chronicity  Abnormal posture  Rationale for Evaluation and Treatment: Rehabilitation  ONSET DATE: 03/29/23  SUBJECTIVE:   SUBJECTIVE STATEMENT: Patient reports very sore and painful in the neck and shoulder, reports the cold wet weather really is bothering her  PERTINENT HISTORY: CVA, CHF, HTN, A-fib, TKA PAIN:  Are you having pain? Yes: NPRS scale: 8/10 Pain location: left upper trap and neck, left ankle and knee  Pain description: ache sore Aggravating factors: neck and shoulder always hurt, knee and ankle hurt with transfers Relieving factors: massage, heat  PRECAUTIONS: None  RED FLAGS: None   WEIGHT BEARING RESTRICTIONS: No  FALLS:  Has patient fallen in last 6 months? No  LIVING ENVIRONMENT: Lives with: lives alone Lives in: House/apartment Stairs: No Has following equipment at home: Environmental consultant - 2 wheeled, Wheelchair (manual), shower chair, bed side commode, Grab bars, and Ramped entry  OCCUPATION: retired  PLOF: Needs assistance with homemaking, Needs assistance with transfers, and Leisure: plays bridge, she has 11 hours of an aide at home that helps with meals, going to MD's, dressing and bathing, is alone at night  PATIENT GOALS: walk, have less pain, transfer without difficulty  NEXT MD VISIT: none scheduled  OBJECTIVE:   DIAGNOSTIC FINDINGS: none performed  COGNITION: Overall cognitive status: Within functional limits for tasks assessed     SENSATION: WFL POSTURE: rounded shoulders and forward head  PALPATION: Left knee and ankle are swollen and tender, left upper and neck  LOWER EXTREMITY ROM:  Active ROM Right eval Left eval   Hip flexion     Hip extension     Hip abduction     Hip adduction     Hip internal rotation     Hip external rotation     Knee flexion  90 93  Knee extension  0 0  Ankle dorsiflexion  5   Ankle plantarflexion  30 44  Ankle inversion  5   Ankle eversion  0    (Blank rows = not  tested)  LOWER EXTREMITY MMT:  MMT Right eval Left eval Left 06/21/23  Hip flexion 4- 3+ 4-  Hip extension     Hip abduction 4 3+ 4-  Hip adduction 4 3+ 4-  Hip internal rotation     Hip external rotation     Knee flexion 4- 3+ 4  Knee extension 4- 3+ 4-  Ankle dorsiflexion     Ankle plantarflexion     Ankle inversion     Ankle eversion      (Blank rows = not tested)  FUNCTIONAL TESTS:  Transfers Max A, left knee gives out and goes into ER and tends to bow into varus, has not walked in about 18 months, and the last time I saw her we could walk about 60 feet with HHA and w/c following  GAIT: Distance walked: unable   TODAY'S TREATMENT:  DATE:  07/12/23 Nustep level 5 x 7 minutes 500 steps Put heat on the shoulders while she was sitting and doing the LE ex's 5# LAQ bilateral 5# marches bilateral Left leg green tband HS curls Left red tband hip add and abduction all 3x10 each leg STM to the upper traps and into the neck Gait with HHA and w/c follow 45' x 2  07/10/23 STM UT, cervical spine, R rhomboids, approximation L digits  NuStep L5 x 7 min 500 steps Red tband left hip adduction Red tband left HS curls Red tband ankle PF/D 5# LAQ 5# marches Gait with w/c follow 37' x2  07/05/23 STM to the neck and upper traps Nustep level 5 x 7 minutes 500 steps Red tband left hip adduction Red tband left HS curls Red tband ankle PF/D 5# LAQ 5# marches 5# LAQ Gait with w/c follow 37' x2   07/03/23 STM to the neck and upper traps Nustep level 5 x 7 minutes 500 steps Really did some education on the transfers, she tends to start a good turn of the transfer but gets almost all the way and then will not step back and leaves the left foot out in front and kind of falls back into the chair. Red tband left hip adduction Red tband left HS curls Red tband  ankle PF/DF 5# LAQ 5# marches Gait with w/c follow 37' x2 Practice transfers  06/28/23 Nustep L 5 Approximation L hand  STM to L UT w/ theragun Green tband HS curls 2 sets 10 5# LAQ 2 sets 12 5# hip flex 2 sets 10 ADD ball squeeze Green tband clams  Gait x 30 feet with HHA and w/c follow 2 x   PATIENT EDUCATION:  Education details: POC Person educated: Patient Education method: Explanation Education comprehension: verbalized understanding  HOME EXERCISE PROGRAM: TBD  ASSESSMENT:  CLINICAL IMPRESSION: Pt enters with increase shoulder pain in both UT. She reports that the weather is making her ache all over.  She was very sore and tight in the upper traps and into the neck, she was able to walk with the HHA and w/c follow 45' twice with some knee pain but seem to do better, she did seem to have some wheezing and I asked her about this, she reports that she has seen heart and lung doctor. OBJECTIVE IMPAIRMENTS: Abnormal gait, cardiopulmonary status limiting activity, decreased activity tolerance, decreased balance, decreased coordination, decreased endurance, decreased mobility, difficulty walking, decreased ROM, decreased strength, increased edema, impaired flexibility, improper body mechanics, postural dysfunction, and pain.   REHAB POTENTIAL: Good  CLINICAL DECISION MAKING: Stable/uncomplicated  EVALUATION COMPLEXITY: Low   GOALS: Goals reviewed with patient? Yes  SHORT TERM GOALS: Target date: 06/02/23 Independent with advanced HEP Goal status: met 06/07/23  LONG TERM GOALS: Target date: 08/21/23  Independent with advanced HEP with caregiver Goal status: INITIAL  2.  Transfer with set up and CGA Goal status: Ongoing 07/12/23  3.  Walk HHA x 100 feet Goal status: Progressing 07/12/23  4.  Increase left LE strength to 4/5 Goal status: Progressing 06/21/23  5.  Report neck and shoulder pain decreased 50% Goal status: Ongoing 07/12/23  PLAN:  PT  FREQUENCY: 1-2x/week  PT DURATION: 12 weeks  PLANNED INTERVENTIONS: Therapeutic exercises, Therapeutic activity, Neuromuscular re-education, Balance training, Gait training, Patient/Family education, Self Care, Joint mobilization, Moist heat, Taping, and Manual therapy  PLAN FOR NEXT SESSION: continue to work on her safe transfers and walking.   Jearld Lesch, PT

## 2023-07-16 DIAGNOSIS — G8929 Other chronic pain: Secondary | ICD-10-CM | POA: Diagnosis not present

## 2023-07-16 DIAGNOSIS — M19011 Primary osteoarthritis, right shoulder: Secondary | ICD-10-CM | POA: Diagnosis not present

## 2023-07-16 DIAGNOSIS — M25512 Pain in left shoulder: Secondary | ICD-10-CM | POA: Diagnosis not present

## 2023-07-17 ENCOUNTER — Ambulatory Visit: Payer: Medicare Other | Admitting: Physical Therapy

## 2023-07-17 ENCOUNTER — Encounter: Payer: Self-pay | Admitting: Physical Therapy

## 2023-07-17 DIAGNOSIS — I6990 Unspecified sequelae of unspecified cerebrovascular disease: Secondary | ICD-10-CM

## 2023-07-17 DIAGNOSIS — I69354 Hemiplegia and hemiparesis following cerebral infarction affecting left non-dominant side: Secondary | ICD-10-CM

## 2023-07-17 DIAGNOSIS — M25512 Pain in left shoulder: Secondary | ICD-10-CM

## 2023-07-17 DIAGNOSIS — R262 Difficulty in walking, not elsewhere classified: Secondary | ICD-10-CM

## 2023-07-17 DIAGNOSIS — M6281 Muscle weakness (generalized): Secondary | ICD-10-CM | POA: Diagnosis not present

## 2023-07-17 DIAGNOSIS — R270 Ataxia, unspecified: Secondary | ICD-10-CM | POA: Diagnosis not present

## 2023-07-17 DIAGNOSIS — M25572 Pain in left ankle and joints of left foot: Secondary | ICD-10-CM | POA: Diagnosis not present

## 2023-07-17 DIAGNOSIS — M542 Cervicalgia: Secondary | ICD-10-CM

## 2023-07-17 DIAGNOSIS — R293 Abnormal posture: Secondary | ICD-10-CM

## 2023-07-17 NOTE — Therapy (Signed)
OUTPATIENT PHYSICAL THERAPY LOWER EXTREMITY TREATMENT        Patient Name: Karen Jackson MRN: [id number] DOB:1941/06/10, 82 y.o., female Today's Date: 07/17/2023  END OF SESSION:  PT End of Session - 07/17/23 1022     Visit Number 17    Date for PT Re-Evaluation 08/21/23    Authorization Type Medicare    PT Start Time 1014    PT Stop Time 1100    PT Time Calculation (min) 46 min    Activity Tolerance Patient tolerated treatment well    Behavior During Therapy WFL for tasks assessed/performed             Past Medical History:  Diagnosis Date   Acute cystitis without hematuria    Acute diastolic CHF (congestive heart failure) (HCC)    Arthritis    Dyspnea    Dysrhythmia    Fever of unknown origin 03/19/2017   Hyperlipidemia    Hypertension    denies at preop   Multifocal pneumonia    Neuromuscular disorder (HCC)    neuropathy left arm and foot   Osteopenia    Paralysis (HCC)    partial left side from CVA    Persistent atrial fibrillation (HCC)    PONV (postoperative nausea and vomiting)    Pre-diabetes    Stroke [facility]) 2013   hemmorahgic   Past Surgical History:  Procedure Laterality Date   ANKLE SURGERY     APPENDECTOMY     CHOLECYSTECTOMY     HARDWARE REMOVAL Left 03/29/2023   Procedure: HARDWARE REMOVAL;  Surgeon: Toni Arthurs, MD;  Location: [facility];  Service: Orthopedics;  Laterality: Left;   HERNIA REPAIR     Esophagus   INCISION AND DRAINAGE OF WOUND Left 03/29/2023   Procedure: LEFT ANKLE WOUND IRRIGATION AND DEBRIDEMENT WOUND;  Surgeon: Toni Arthurs, MD;  Location:  SURGERY CENTER;  Service: Orthopedics;  Laterality: Left;   JOINT REPLACEMENT     total- right partial- left   MASTECTOMY PARTIAL / LUMPECTOMY  2012   left   ORIF ANKLE FRACTURE Left 07/20/2018   Procedure: OPEN REDUCTION INTERNAL FIXATION (ORIF) ANKLE FRACTURE;  Surgeon: Toni Arthurs, MD;  Location: [facility];  Service: Orthopedics;  Laterality: Left;   TOTAL  KNEE ARTHROPLASTY Left 01/27/2019   Procedure: TOTAL KNEE ARTHROPLASTY;  Surgeon: Dannielle Huh, MD;  Location: [facility];  Service: Orthopedics;  Laterality: Left;   Patient Active Problem List   Diagnosis Date Noted   Goals of care, counseling/discussion 01/17/2022   Grief 01/17/2022   Secondary hypercoagulable state (HCC) 03/16/2021   Dizziness 03/10/2021   Orthostatic hypotension 10/15/2020   Presbycusis of both ears 03/08/2020   Mixed stress and urge urinary incontinence 12/02/2019   Macrocytosis 12/01/2019   Nutritional anemia 12/01/2019   S/P total knee replacement 01/27/2019   Recurrent left knee instability 07/05/2018   Respiratory failure with hypoxia (HCC) 08/30/2017   Hypoxemia    Heart failure with preserved ejection fraction (HCC), Grade 3 diastolic dysfunction 03/26/2017   PAF (paroxysmal atrial fibrillation) (HCC)    Dyspnea 03/19/2017   Encounter for preventive health examination 02/17/2016   Sensorineural hearing loss (SNHL), bilateral 01/26/2016   Hypomagnesemia 04/24/2014   Hemiparesis affecting left side as late effect of cerebrovascular accident (HCC) 04/24/2014   Nontraumatic cerebral hemorrhage (HCC) 04/30/2012   DM (diabetes mellitus) with complications (HCC) 03/04/2010   OSTEOPENIA 01/21/2009   UNSPECIFIED VITAMIN D DEFICIENCY 11/19/2007   HYPERCHOLESTEROLEMIA 10/25/2006   GASTROESOPHAGEAL REFLUX, NO ESOPHAGITIS 10/25/2006  DIVERTICULOSIS OF COLON 10/25/2006   Osteoarthritis 10/25/2006   CERVICAL SPINE DISORDER, NOS 10/25/2006    PCP: Waynard Edwards, MD  REFERRING PROVIDER: Waynard Edwards, MD  REFERRING DIAG: s/p left ankle ORIF hardware removal  THERAPY DIAG:  Difficulty in walking, not elsewhere classified  Muscle weakness (generalized)  Ataxia  Hemiplegia and hemiparesis following cerebral infarction affecting left non-dominant side (HCC)  Pain in left ankle and joints of left foot  Late effects of CVA (cerebrovascular accident)  Cervicalgia  Left  shoulder pain, unspecified chronicity  Abnormal posture  Rationale for Evaluation and Treatment: Rehabilitation  ONSET DATE: 03/29/23  SUBJECTIVE:   SUBJECTIVE STATEMENT: Patient reports she got a right shoulder injection yesterday, still sore in the neck  PERTINENT HISTORY: CVA, CHF, HTN, A-fib, TKA PAIN:  Are you having pain? Yes: NPRS scale: 8/10 Pain location: left upper trap and neck, left ankle and knee  Pain description: ache sore Aggravating factors: neck and shoulder always hurt, knee and ankle hurt with transfers Relieving factors: massage, heat  PRECAUTIONS: None  RED FLAGS: None   WEIGHT BEARING RESTRICTIONS: No  FALLS:  Has patient fallen in last 6 months? No  LIVING ENVIRONMENT: Lives with: lives alone Lives in: House/apartment Stairs: No Has following equipment at home: Environmental consultant - 2 wheeled, Wheelchair (manual), shower chair, bed side commode, Grab bars, and Ramped entry  OCCUPATION: retired  PLOF: Needs assistance with homemaking, Needs assistance with transfers, and Leisure: plays bridge, she has 11 hours of an aide at home that helps with meals, going to MD's, dressing and bathing, is alone at night  PATIENT GOALS: walk, have less pain, transfer without difficulty  NEXT MD VISIT: none scheduled  OBJECTIVE:   DIAGNOSTIC FINDINGS: none performed  COGNITION: Overall cognitive status: Within functional limits for tasks assessed     SENSATION: WFL POSTURE: rounded shoulders and forward head  PALPATION: Left knee and ankle are swollen and tender, left upper and neck  LOWER EXTREMITY ROM:  Active ROM Right eval Left eval   Hip flexion     Hip extension     Hip abduction     Hip adduction     Hip internal rotation     Hip external rotation     Knee flexion  90 93  Knee extension  0 0  Ankle dorsiflexion  5   Ankle plantarflexion  30 44  Ankle inversion  5   Ankle eversion  0    (Blank rows = not tested)  LOWER EXTREMITY MMT:  MMT  Right eval Left eval Left 06/21/23  Hip flexion 4- 3+ 4-  Hip extension     Hip abduction 4 3+ 4-  Hip adduction 4 3+ 4-  Hip internal rotation     Hip external rotation     Knee flexion 4- 3+ 4  Knee extension 4- 3+ 4-  Ankle dorsiflexion     Ankle plantarflexion     Ankle inversion     Ankle eversion      (Blank rows = not tested)  FUNCTIONAL TESTS:  Transfers Max A, left knee gives out and goes into ER and tends to bow into varus, has not walked in about 18 months, and the last time I saw her we could walk about 60 feet with HHA and w/c following  GAIT: Distance walked: unable   TODAY'S TREATMENT:  DATE:  07/17/23 Nustep level 5 x 7 minutes 500 steps 5# LAQ 5# marches Green tband PF/DF Green tband HS curls Green tband hip abduction Green tband hip adduction STM to the left upper trap and neck Gait with HHA and close W/C follow 45' x 2  07/12/23 Nustep level 5 x 7 minutes 500 steps Put heat on the shoulders while she was sitting and doing the LE ex's 5# LAQ bilateral 5# marches bilateral Left leg green tband HS curls Left red tband hip add and abduction all 3x10 each leg STM to the upper traps and into the neck Gait with HHA and w/c follow 45' x 2  07/10/23 STM UT, cervical spine, R rhomboids, approximation L digits  NuStep L5 x 7 min 500 steps Red tband left hip adduction Red tband left HS curls Red tband ankle PF/D 5# LAQ 5# marches Gait with w/c follow 37' x2  07/05/23 STM to the neck and upper traps Nustep level 5 x 7 minutes 500 steps Red tband left hip adduction Red tband left HS curls Red tband ankle PF/D 5# LAQ 5# marches 5# LAQ Gait with w/c follow 37' x2   07/03/23 STM to the neck and upper traps Nustep level 5 x 7 minutes 500 steps Really did some education on the transfers, she tends to start a good turn of the  transfer but gets almost all the way and then will not step back and leaves the left foot out in front and kind of falls back into the chair. Red tband left hip adduction Red tband left HS curls Red tband ankle PF/DF 5# LAQ 5# marches Gait with w/c follow 37' x2 Practice transfers  06/28/23 Nustep L 5 Approximation L hand  STM to L UT w/ theragun Green tband HS curls 2 sets 10 5# LAQ 2 sets 12 5# hip flex 2 sets 10 ADD ball squeeze Green tband clams  Gait x 30 feet with HHA and w/c follow 2 x   PATIENT EDUCATION:  Education details: POC Person educated: Patient Education method: Explanation Education comprehension: verbalized understanding  HOME EXERCISE PROGRAM: TBD  ASSESSMENT:  CLINICAL IMPRESSION: Pt enters with increase shoulder pain in both UT. She reports that the weather is making her ache all over.  Got an injection yesterday in the right shoulder, some less pain but still tight in the left upper trap and neck, we have been doing well with the walking and has to stop now due to fatigue and not knee pain, she is also doing well with not allowing the left leg to fall out on the nustep OBJECTIVE IMPAIRMENTS: Abnormal gait, cardiopulmonary status limiting activity, decreased activity tolerance, decreased balance, decreased coordination, decreased endurance, decreased mobility, difficulty walking, decreased ROM, decreased strength, increased edema, impaired flexibility, improper body mechanics, postural dysfunction, and pain.   REHAB POTENTIAL: Good  CLINICAL DECISION MAKING: Stable/uncomplicated  EVALUATION COMPLEXITY: Low   GOALS: Goals reviewed with patient? Yes  SHORT TERM GOALS: Target date: 06/02/23 Independent with advanced HEP Goal status: met 06/07/23  LONG TERM GOALS: Target date: 08/21/23  Independent with advanced HEP with caregiver Goal status: ongoing 07/17/23  2.  Transfer with set up and CGA Goal status: Ongoing 07/12/23  3.  Walk HHA  x 100 feet Goal status: Progressing 07/12/23  4.  Increase left LE strength to 4/5 Goal status: Progressing 06/21/23  5.  Report neck and shoulder pain decreased 50% Goal status: Ongoing 07/12/23  PLAN:  PT FREQUENCY: 1-2x/week  PT  DURATION: 12 weeks  PLANNED INTERVENTIONS: Therapeutic exercises, Therapeutic activity, Neuromuscular re-education, Balance training, Gait training, Patient/Family education, Self Care, Joint mobilization, Moist heat, Taping, and Manual therapy  PLAN FOR NEXT SESSION: continue to work on her safe transfers and walking.   Jearld Lesch, PT

## 2023-07-19 ENCOUNTER — Ambulatory Visit: Payer: Medicare Other | Admitting: Physical Therapy

## 2023-07-19 ENCOUNTER — Encounter: Payer: Self-pay | Admitting: Physical Therapy

## 2023-07-19 DIAGNOSIS — M25512 Pain in left shoulder: Secondary | ICD-10-CM

## 2023-07-19 DIAGNOSIS — M6281 Muscle weakness (generalized): Secondary | ICD-10-CM

## 2023-07-19 DIAGNOSIS — I69354 Hemiplegia and hemiparesis following cerebral infarction affecting left non-dominant side: Secondary | ICD-10-CM | POA: Diagnosis not present

## 2023-07-19 DIAGNOSIS — M25572 Pain in left ankle and joints of left foot: Secondary | ICD-10-CM | POA: Diagnosis not present

## 2023-07-19 DIAGNOSIS — R270 Ataxia, unspecified: Secondary | ICD-10-CM

## 2023-07-19 DIAGNOSIS — M542 Cervicalgia: Secondary | ICD-10-CM

## 2023-07-19 DIAGNOSIS — I6990 Unspecified sequelae of unspecified cerebrovascular disease: Secondary | ICD-10-CM

## 2023-07-19 DIAGNOSIS — R262 Difficulty in walking, not elsewhere classified: Secondary | ICD-10-CM

## 2023-07-19 NOTE — Therapy (Signed)
OUTPATIENT PHYSICAL THERAPY LOWER EXTREMITY TREATMENT        Patient Name: Karen Jackson MRN: [id number] DOB:1941/08/05, 82 y.o., female Today's Date: 07/19/2023  END OF SESSION:  PT End of Session - 07/19/23 1112     Visit Number 18    Date for PT Re-Evaluation 08/21/23    Authorization Type Medicare    PT Start Time 1100    PT Stop Time 1145    PT Time Calculation (min) 45 min    Activity Tolerance Patient tolerated treatment well    Behavior During Therapy [facility] for tasks assessed/performed             Past Medical History:  Diagnosis Date   Acute cystitis without hematuria    Acute diastolic CHF (congestive heart failure) (HCC)    Arthritis    Dyspnea    Dysrhythmia    Fever of unknown origin 03/19/2017   Hyperlipidemia    Hypertension    denies at preop   Multifocal pneumonia    Neuromuscular disorder (HCC)    neuropathy left arm and foot   Osteopenia    Paralysis (HCC)    partial left side from CVA    Persistent atrial fibrillation (HCC)    PONV (postoperative nausea and vomiting)    Pre-diabetes    Stroke [facility]) 2013   hemmorahgic   Past Surgical History:  Procedure Laterality Date   ANKLE SURGERY     APPENDECTOMY     CHOLECYSTECTOMY     HARDWARE REMOVAL Left 03/29/2023   Procedure: HARDWARE REMOVAL;  Surgeon: Toni Arthurs, MD;  Location: [facility];  Service: Orthopedics;  Laterality: Left;   HERNIA REPAIR     Esophagus   INCISION AND DRAINAGE OF WOUND Left 03/29/2023   Procedure: LEFT ANKLE WOUND IRRIGATION AND DEBRIDEMENT WOUND;  Surgeon: Toni Arthurs, MD;  Location: [facility];  Service: Orthopedics;  Laterality: Left;   JOINT REPLACEMENT     total- right partial- left   MASTECTOMY PARTIAL / LUMPECTOMY  2012   left   ORIF ANKLE FRACTURE Left 07/20/2018   Procedure: OPEN REDUCTION INTERNAL FIXATION (ORIF) ANKLE FRACTURE;  Surgeon: Toni Arthurs, MD;  Location: [facility];  Service: Orthopedics;  Laterality: Left;   TOTAL  KNEE ARTHROPLASTY Left 01/27/2019   Procedure: TOTAL KNEE ARTHROPLASTY;  Surgeon: Dannielle Huh, MD;  Location: [facility];  Service: Orthopedics;  Laterality: Left;   Patient Active Problem List   Diagnosis Date Noted   Goals of care, counseling/discussion 01/17/2022   Grief 01/17/2022   Secondary hypercoagulable state (HCC) 03/16/2021   Dizziness 03/10/2021   Orthostatic hypotension 10/15/2020   Presbycusis of both ears 03/08/2020   Mixed stress and urge urinary incontinence 12/02/2019   Macrocytosis 12/01/2019   Nutritional anemia 12/01/2019   S/P total knee replacement 01/27/2019   Recurrent left knee instability 07/05/2018   Respiratory failure with hypoxia (HCC) 08/30/2017   Hypoxemia    Heart failure with preserved ejection fraction (HCC), Grade 3 diastolic dysfunction 03/26/2017   PAF (paroxysmal atrial fibrillation) (HCC)    Dyspnea 03/19/2017   Encounter for preventive health examination 02/17/2016   Sensorineural hearing loss (SNHL), bilateral 01/26/2016   Hypomagnesemia 04/24/2014   Hemiparesis affecting left side as late effect of cerebrovascular accident (HCC) 04/24/2014   Nontraumatic cerebral hemorrhage (HCC) 04/30/2012   DM (diabetes mellitus) with complications (HCC) 03/04/2010   OSTEOPENIA 01/21/2009   UNSPECIFIED VITAMIN D DEFICIENCY 11/19/2007   HYPERCHOLESTEROLEMIA 10/25/2006   GASTROESOPHAGEAL REFLUX, NO ESOPHAGITIS 10/25/2006  DIVERTICULOSIS OF COLON 10/25/2006   Osteoarthritis 10/25/2006   CERVICAL SPINE DISORDER, NOS 10/25/2006    PCP: Waynard Edwards, MD  REFERRING PROVIDER: Waynard Edwards, MD  REFERRING DIAG: s/p left ankle ORIF hardware removal  THERAPY DIAG:  Difficulty in walking, not elsewhere classified  Muscle weakness (generalized)  Ataxia  Hemiplegia and hemiparesis following cerebral infarction affecting left non-dominant side (HCC)  Pain in left ankle and joints of left foot  Late effects of CVA (cerebrovascular accident)  Cervicalgia  Left  shoulder pain, unspecified chronicity  Rationale for Evaluation and Treatment: Rehabilitation  ONSET DATE: 03/29/23  SUBJECTIVE:   SUBJECTIVE STATEMENT: Patient reports the shoulder injection really did not help much   PERTINENT HISTORY: CVA, CHF, HTN, A-fib, TKA PAIN:  Are you having pain? Yes: NPRS scale: 8/10 Pain location: left upper trap and neck, left ankle and knee  Pain description: ache sore Aggravating factors: neck and shoulder always hurt, knee and ankle hurt with transfers Relieving factors: massage, heat  PRECAUTIONS: None  RED FLAGS: None   WEIGHT BEARING RESTRICTIONS: No  FALLS:  Has patient fallen in last 6 months? No  LIVING ENVIRONMENT: Lives with: lives alone Lives in: House/apartment Stairs: No Has following equipment at home: Environmental consultant - 2 wheeled, Wheelchair (manual), shower chair, bed side commode, Grab bars, and Ramped entry  OCCUPATION: retired  PLOF: Needs assistance with homemaking, Needs assistance with transfers, and Leisure: plays bridge, she has 11 hours of an aide at home that helps with meals, going to MD's, dressing and bathing, is alone at night  PATIENT GOALS: walk, have less pain, transfer without difficulty  NEXT MD VISIT: none scheduled  OBJECTIVE:   DIAGNOSTIC FINDINGS: none performed  COGNITION: Overall cognitive status: Within functional limits for tasks assessed     SENSATION: WFL POSTURE: rounded shoulders and forward head  PALPATION: Left knee and ankle are swollen and tender, left upper and neck  LOWER EXTREMITY ROM:  Active ROM Right eval Left eval   Hip flexion     Hip extension     Hip abduction     Hip adduction     Hip internal rotation     Hip external rotation     Knee flexion  90 93  Knee extension  0 0  Ankle dorsiflexion  5   Ankle plantarflexion  30 44  Ankle inversion  5   Ankle eversion  0    (Blank rows = not tested)  LOWER EXTREMITY MMT:  MMT Right eval Left eval Left 06/21/23   Hip flexion 4- 3+ 4-  Hip extension     Hip abduction 4 3+ 4-  Hip adduction 4 3+ 4-  Hip internal rotation     Hip external rotation     Knee flexion 4- 3+ 4  Knee extension 4- 3+ 4-  Ankle dorsiflexion     Ankle plantarflexion     Ankle inversion     Ankle eversion      (Blank rows = not tested)  FUNCTIONAL TESTS:  Transfers Max A, left knee gives out and goes into ER and tends to bow into varus, has not walked in about 18 months, and the last time I saw her we could walk about 60 feet with HHA and w/c following  GAIT: Distance walked: unable   TODAY'S TREATMENT:  DATE:  07/19/23 5# LAQ 5# marches STM to the upper traps, rhomboids and the neck  Nustep level 5 x 8 minutes c/o some knee pain today Green tband Hip adduction left Green tband hip abduction left Green tband HS curls Green tband ankle PF/DF Gait with HHA and W/C follow 50 feet x 1 today due to some knee and ankle soreness  07/17/23 Nustep level 5 x 7 minutes 500 steps 5# LAQ 5# marches Green tband PF/DF Green tband HS curls Green tband hip abduction Green tband hip adduction STM to the left upper trap and neck Gait with HHA and close W/C follow 45' x 2  07/12/23 Nustep level 5 x 7 minutes 500 steps Put heat on the shoulders while she was sitting and doing the LE ex's 5# LAQ bilateral 5# marches bilateral Left leg green tband HS curls Left red tband hip add and abduction all 3x10 each leg STM to the upper traps and into the neck Gait with HHA and w/c follow 45' x 2  07/10/23 STM UT, cervical spine, R rhomboids, approximation L digits  NuStep L5 x 7 min 500 steps Red tband left hip adduction Red tband left HS curls Red tband ankle PF/D 5# LAQ 5# marches Gait with w/c follow 37' x2  07/05/23 STM to the neck and upper traps Nustep level 5 x 7 minutes 500 steps Red  tband left hip adduction Red tband left HS curls Red tband ankle PF/D 5# LAQ 5# marches 5# LAQ Gait with w/c follow 37' x2   07/03/23 STM to the neck and upper traps Nustep level 5 x 7 minutes 500 steps Really did some education on the transfers, she tends to start a good turn of the transfer but gets almost all the way and then will not step back and leaves the left foot out in front and kind of falls back into the chair. Red tband left hip adduction Red tband left HS curls Red tband ankle PF/DF 5# LAQ 5# marches Gait with w/c follow 37' x2 Practice transfers  06/28/23 Nustep L 5 Approximation L hand  STM to L UT w/ theragun Green tband HS curls 2 sets 10 5# LAQ 2 sets 12 5# hip flex 2 sets 10 ADD ball squeeze Green tband clams  Gait x 30 feet with HHA and w/c follow 2 x   PATIENT EDUCATION:  Education details: POC Person educated: Patient Education method: Explanation Education comprehension: verbalized understanding  HOME EXERCISE PROGRAM: TBD  ASSESSMENT:  CLINICAL IMPRESSION: Pt having some shoulder and knee and ankle pain today, again c./o the weather being colder.  She was able to go the 25' but only once today.  She did have a little more difficulty with the left knee falling out on the nustep  OBJECTIVE IMPAIRMENTS: Abnormal gait, cardiopulmonary status limiting activity, decreased activity tolerance, decreased balance, decreased coordination, decreased endurance, decreased mobility, difficulty walking, decreased ROM, decreased strength, increased edema, impaired flexibility, improper body mechanics, postural dysfunction, and pain.   REHAB POTENTIAL: Good  CLINICAL DECISION MAKING: Stable/uncomplicated  EVALUATION COMPLEXITY: Low   GOALS: Goals reviewed with patient? Yes  SHORT TERM GOALS: Target date: 06/02/23 Independent with advanced HEP Goal status: met 06/07/23  LONG TERM GOALS: Target date: 08/21/23  Independent with advanced HEP with  caregiver Goal status: ongoing 07/17/23  2.  Transfer with set up and CGA Goal status: Ongoing 07/12/23  3.  Walk HHA x 100 feet Goal status: Progressing 07/12/23  4.  Increase left  LE strength to 4/5 Goal status: Progressing 07/18/23  5.  Report neck and shoulder pain decreased 50% Goal status: Ongoing 07/12/23  PLAN:  PT FREQUENCY: 1-2x/week  PT DURATION: 12 weeks  PLANNED INTERVENTIONS: Therapeutic exercises, Therapeutic activity, Neuromuscular re-education, Balance training, Gait training, Patient/Family education, Self Care, Joint mobilization, Moist heat, Taping, and Manual therapy  PLAN FOR NEXT SESSION: continue to work on her safe transfers and walking.   Jearld Lesch, PT

## 2023-07-22 NOTE — Progress Notes (Unsigned)
Cardiology Office Note:    Date:  07/24/2023   ID:  YENNA ROZEBOOM, DOB September 16, 1940, MRN [id number]  PCP:  Rodrigo Ran, MD  Cardiologist:  Little Ishikawa, MD  Electrophysiologist:  Lanier Prude, MD   Referring MD: Rodrigo Ran, MD   Chief Complaint  Patient presents with   Coronary Artery Disease    History of Present Illness:    Karen Jackson is a 82 y.o. female with a hx of hemorrhagic CVA in 2013, persistent atrial fibrillation, chronic diastolic heart failure, hypertension, hyperlipidemia who presents for follow-up.  She was seen for an initial visit for diastolic heart failure on 07/26/2021.  She follows with Dr. Lalla Brothers in EP for her A. fib.  Was seen on 07/05/2021 and started on Eliquis.  Noted to be warm and wet on exam, Lasix was increased to 80 mg twice daily x3 days then resume Lasix 80 mg daily.  Echo 06/03/2021 showed EF 50 to 55%, normal RV function, mild biatrial enlargement, mild MR, mild to moderate TR. Calcium score 2093 (97th percentile) on 05/25/2021.  Lexiscan Myoview on 08/09/2021 showed normal perfusion, EF 58%.  Zio patch x7 days on 08/11/2021 showed 100% A-fib burden with average rate 84 bpm, frequent PVCs (9.4% of beats).  Echocardiogram 05/09/22 showed EF 55-60%, normal RV function/mild enlargement, mildy elevated pulmonary pressures (RVSP ), moderate LAE,no significant valvular disease.  Since last clinic visit, she reports she is doing okay.  Reports no shortness of breath but does report some lower extremity edema.  She is taking Lasix every other day, states it seems to be keeping fluid off.  She denies any chest pain.  Denies any lightheadedness or syncope.  No bleeding on Eliquis.   Wt Readings from Last 3 Encounters:  07/24/23 160 lb (72.6 kg)  03/29/23 160 lb (72.6 kg)  01/25/23 160 lb (72.6 kg)       Past Medical History:  Diagnosis Date   Acute cystitis without hematuria    Acute diastolic CHF (congestive heart failure) (HCC)     Arthritis    Dyspnea    Dysrhythmia    Fever of unknown origin 03/19/2017   Hyperlipidemia    Hypertension    denies at preop   Multifocal pneumonia    Neuromuscular disorder (HCC)    neuropathy left arm and foot   Osteopenia    Paralysis (HCC)    partial left side from CVA    Persistent atrial fibrillation (HCC)    PONV (postoperative nausea and vomiting)    Pre-diabetes    Stroke [facility]) 2013   hemmorahgic    Past Surgical History:  Procedure Laterality Date   ANKLE SURGERY     APPENDECTOMY     CHOLECYSTECTOMY     HARDWARE REMOVAL Left 03/29/2023   Procedure: HARDWARE REMOVAL;  Surgeon: Toni Arthurs, MD;  Location: [facility];  Service: Orthopedics;  Laterality: Left;   HERNIA REPAIR     Esophagus   INCISION AND DRAINAGE OF WOUND Left 03/29/2023   Procedure: LEFT ANKLE WOUND IRRIGATION AND DEBRIDEMENT WOUND;  Surgeon: Toni Arthurs, MD;  Location: [facility];  Service: Orthopedics;  Laterality: Left;   JOINT REPLACEMENT     total- right partial- left   MASTECTOMY PARTIAL / LUMPECTOMY  2012   left   ORIF ANKLE FRACTURE Left 07/20/2018   Procedure: OPEN REDUCTION INTERNAL FIXATION (ORIF) ANKLE FRACTURE;  Surgeon: Toni Arthurs, MD;  Location: [facility];  Service: Orthopedics;  Laterality: Left;  TOTAL KNEE ARTHROPLASTY Left 01/27/2019   Procedure: TOTAL KNEE ARTHROPLASTY;  Surgeon: Dannielle Huh, MD;  Location: [facility];  Service: Orthopedics;  Laterality: Left;    Current Medications: Current Meds  Medication Sig   apixaban (ELIQUIS) 5 MG TABS tablet Take 1 tablet (5 mg total) by mouth 2 (two) times daily.   atorvastatin (LIPITOR) 40 MG tablet TAKE 1 TABLET(40 MG) BY MOUTH DAILY   Calcium Carb-Cholecalciferol (CALCIUM CARBONATE-VITAMIN D3) 600-400 MG-UNIT TABS Take 1 tablet by mouth daily.   celecoxib (CELEBREX) 200 MG capsule TAKE 1 CAPSULE BY MOUTH DAILY AS NEEDED FOR ARTHRITIS OR PAIN   diclofenac Sodium (VOLTAREN) 1 % GEL APPLY 2 GRAMS EXTERNALLY TO  THE AFFECTED AREA FOUR TIMES DAILY   docusate sodium (COLACE) 100 MG capsule Take 1 capsule (100 mg total) by mouth 2 (two) times daily. While taking narcotic pain medicine.   ergocalciferol (VITAMIN D2) 1.25 MG (50000 UT) capsule 50,000 unit   gabapentin (NEURONTIN) 100 MG capsule TAKE 1 CAPSULE BY MOUTH 3  TIMES DAILY   hydrocortisone 2.5 % cream Apply topically 2 (two) times daily as needed.   lidocaine (LIDODERM) 5 % PLACE 1 PATCH ONTO THE SKIN DAILY. REMOVE AND DISCORD PATCH WITHIN 12 HOURS OR AS DIRECTED BY MD   MAGnesium-Oxide 400 (240 Mg) MG tablet Take 1 tablet (400 mg total) by mouth 2 (two) times daily.   metoprolol succinate (TOPROL-XL) 50 MG 24 hr tablet Take 1 tablet (50 mg total) by mouth daily. Take with or immediately following a meal.   omeprazole (PRILOSEC) 20 MG capsule TAKE 1 CAPSULE BY MOUTH  DAILY   Semaglutide, 1 MG/DOSE, (OZEMPIC, 1 MG/DOSE,) 4 MG/3ML SOPN Inject 1 mg into the skin once a week.   senna (SENOKOT) 8.6 MG TABS tablet Take 2 tablets (17.2 mg total) by mouth 2 (two) times daily.   traZODone (DESYREL) 50 MG tablet TAKE 1 TABLET BY MOUTH AT  BEDTIME   [DISCONTINUED] furosemide (LASIX) 80 MG tablet TAKE 1 TABLET BY MOUTH  DAILY     Allergies:   Codeine phosphate   Social History   Socioeconomic History   Marital status: Widowed    Spouse name: Not on file   Number of children: 2   Years of education: college   Highest education level: Not on file  Occupational History   Occupation: RetiredFilm/video editor  Tobacco Use   Smoking status: Former    Current packs/day: 0.00    Average packs/day: 0.5 packs/day for 23.5 years (11.8 ttl pk-yrs)    Types: Cigarettes    Start date: 08/28/1957    Quit date: 03/09/1981    Years since quitting: 42.4   Smokeless tobacco: Never  Vaping Use   Vaping status: Never Used  Substance and Sexual Activity   Alcohol use: Yes    Alcohol/week: 16.0 standard drinks of alcohol    Types: 2 Glasses of wine, 14 Standard drinks or  equivalent per week    Comment: daily one glass last pm 03/28/2023   Drug use: No   Sexual activity: Not Currently  Other Topics Concern   Not on file  Social History Narrative   ** Merged History Encounter ** Health Care POA: son, Charly RayEmergency Contact: Ann and Yehuda Savannah, niece & nephew (c) [phone] (h) [phone] of Life Plan: Who lives with you: selfAny pets: noneDiet: Pt has a varied diet of protein, vegetables and limits "white" foods.Exercise: Pt does water aerobics 3x week, golf 2x weekSeatbelts: Pt reports wearing seatbelt when  in vehicle. Sun Exposure/Protection: Pt reports wearing sun screenHobbies: golfing, swimming    Lives alone (has caregivers coming in)   R handed    Social Determinants of Health   Financial Resource Strain: Not on file  Food Insecurity: Not on file  Transportation Needs: Not on file  Physical Activity: Not on file  Stress: Not on file  Social Connections: Not on file     Family History: The patient's family history includes Dementia in her father; Diabetes in her mother; Transient ischemic attack in her father.  ROS:   Please see the history of present illness.     All other systems reviewed and are negative.  EKGs/Labs/Other Studies Reviewed:    The following studies were reviewed today:   EKG:   04/27/2022: Atrial fibrillation, rate 76 01/25/2023: Sinus rhythm, first-degree AV block, PVC, left axis deviation, poor R wave progression 07/24/2023: Atrial flutter with variable conduction, low voltage  Recent Labs: 01/25/2023: BUN 13; Hemoglobin 14.9; Magnesium 1.9; Platelets 295; Potassium 3.5; Sodium 138 03/07/2023: Creatinine, Ser 0.70  Recent Lipid Panel    Component Value Date/Time   CHOL 164 04/27/2022 1557   TRIG 239 (H) 04/27/2022 1557   HDL 65 04/27/2022 1557   CHOLHDL 2.5 04/27/2022 1557   CHOLHDL 2.5 06/17/2015 1057   VLDL 23 06/17/2015 1057   LDLCALC 61 04/27/2022 1557   LDLDIRECT 107 (H) 04/24/2014 1118    Physical  Exam:    VS:  BP 110/60   Pulse 75   Ht 4\' 10"  (1.473 m)   Wt 160 lb (72.6 kg)   SpO2 92%   BMI 33.44 kg/m     Wt Readings from Last 3 Encounters:  07/24/23 160 lb (72.6 kg)  03/29/23 160 lb (72.6 kg)  01/25/23 160 lb (72.6 kg)     GEN:  Well nourished, well developed in no acute distress HEENT: Normal NECK: No JVD; No carotid bruits LYMPHATICS: No lymphadenopathy CARDIAC: irregular, normal rate, no murmurs, rubs, gallops RESPIRATORY:  Clear to auscultation without rales, wheezing or rhonchi  ABDOMEN: Soft, non-tender, non-distended MUSCULOSKELETAL:  No edema; No deformity  SKIN: Warm and dry NEUROLOGIC:  Alert and oriented x 3 PSYCHIATRIC:  Normal affect   ASSESSMENT:    1. Chronic diastolic heart failure (HCC)   2. Persistent atrial fibrillation (HCC)   3. Pulmonary hypertension (HCC)   4. Essential hypertension   5. Hyperlipidemia, unspecified hyperlipidemia type       PLAN:    Dyspnea: She reports dyspnea with exertion.  Likely multifactorial with chronic diastolic heart failure and possible restrictive lung disease contributing.  Dilated pulmonary artery on CT suggesting pulmonary hypertension.  Echocardiogram 05/09/22 showed EF 55-60%, normal RV function/mild enlargement, mildy elevated pulmonary pressures (RVSP ), moderate LAE,no significant valvular disease. -Discussed RHC/LHC for further evaluation of her symptoms and pulmonary hypertension but her preference is to hold off.  Given her age and comorbidities, I think this is reasonable.  CAD:  Calcium score 2093 (97th percentile) on 05/25/2021.  Reported dyspnea on exertion.  Lexiscan Myoview on 08/09/2021 showed normal perfusion, EF 58%.   -Continue Eliquis -Continue atorvastatin  Chronic diastolic heart failure: Echo 06/03/2021 showed EF 50 to 55%, normal RV function, mild biatrial enlargement, mild MR, mild to moderate TR, indeterminate diastolic function. -Continue Lasix 80 mg, she has reduced dose to  every other day.  Appears euvolemic.  Taking calcium, magnesium, potassium supplement.  Check BMET, magnesium  Persistent atrial fibrillation: Follows with EP.  History of hemorrhagic stroke  in 2013.  Has been off anticoagulation, Dr. Lalla Brothers discussed with Dr. Pearlean Brownie in neurology and agreed to rechallenge with Eliquis.  Started on Eliquis 5 mg twice daily.  On metoprolol 25 mg twice daily for rate control. Zio patch x7 days on 08/11/2021 showed 100% A-fib burden with average rate 84 bpm, frequent PVCs (9.4% of beats) -Continue Eliquis 5 mg twice daily.  Check CBC -Continue Toprol-XL 50 mg daily -She is in atrial flutter with controlled rate in clinic today  Frequent PVCs: 9.4% of beats on Zio patch 08/11/2021 -Continue Toprol-XL -Follows with Dr. Lalla Brothers in EP  Hemorrhagic CVA: In 2013.  Has had no issues since starting Eliquis in 2022  Hypertension: On Toprol-XL 50 mg daily, Lasix 80 mg daily.  Appears controlled  Hyperlipidemia: On atorvastatin 40 mg daily.  LDL 61 on 04/27/22   RTC in 6 months    Medication Adjustments/Labs and Tests Ordered: Current medicines are reviewed at length with the patient today.  Concerns regarding medicines are outlined above.  Orders Placed This Encounter  Procedures   Basic metabolic panel   Magnesium   EKG 12-Lead   Meds ordered this encounter  Medications   furosemide (LASIX) 80 MG tablet    Sig: Take 1 tablet (80 mg total) by mouth every other day.    Dispense:  90 tablet    Refill:  3    Requesting 1 year supply   potassium chloride SA (KLOR-CON M20) 20 MEQ tablet    Sig: Take 1 tablet (20 mEq total) by mouth daily.    Dispense:  180 tablet    Refill:  3     Patient Instructions  Medication Instructions:  Continue to take Lasix every other day and Potassium once daily as you have been.  *If you need a refill on your cardiac medications before your next appointment, please call your pharmacy*   Lab Work: BMET Magnesium to be done  at PCP office.  If you have labs (blood work) drawn today and your tests are completely normal, you will receive your results only by: MyChart Message (if you have MyChart) OR A paper copy in the mail If you have any lab test that is abnormal or we need to change your treatment, we will call you to review the results.    Follow-Up: At [facility], you and your health needs are our priority.  As part of our continuing mission to provide you with exceptional heart care, we have created designated Provider Care Teams.  These Care Teams include your primary Cardiologist (physician) and Advanced Practice Providers (APPs -  Physician Assistants and Nurse Practitioners) who all work together to provide you with the care you need, when you need it.  Your next appointment:   6 month(s)  Provider:   Little Ishikawa, MD        Signed, Little Ishikawa, MD  07/24/2023 10:44 PM    [facility]

## 2023-07-24 ENCOUNTER — Ambulatory Visit: Payer: Medicare Other | Attending: Cardiology | Admitting: Cardiology

## 2023-07-24 ENCOUNTER — Encounter: Payer: Self-pay | Admitting: Cardiology

## 2023-07-24 ENCOUNTER — Encounter: Payer: Self-pay | Admitting: Physical Therapy

## 2023-07-24 ENCOUNTER — Ambulatory Visit: Payer: Medicare Other | Admitting: Physical Therapy

## 2023-07-24 VITALS — BP 110/60 | HR 75 | Ht <= 58 in | Wt 160.0 lb

## 2023-07-24 DIAGNOSIS — R262 Difficulty in walking, not elsewhere classified: Secondary | ICD-10-CM | POA: Diagnosis not present

## 2023-07-24 DIAGNOSIS — R270 Ataxia, unspecified: Secondary | ICD-10-CM

## 2023-07-24 DIAGNOSIS — I10 Essential (primary) hypertension: Secondary | ICD-10-CM | POA: Insufficient documentation

## 2023-07-24 DIAGNOSIS — E785 Hyperlipidemia, unspecified: Secondary | ICD-10-CM | POA: Diagnosis not present

## 2023-07-24 DIAGNOSIS — I6990 Unspecified sequelae of unspecified cerebrovascular disease: Secondary | ICD-10-CM | POA: Diagnosis not present

## 2023-07-24 DIAGNOSIS — I4819 Other persistent atrial fibrillation: Secondary | ICD-10-CM | POA: Insufficient documentation

## 2023-07-24 DIAGNOSIS — I272 Pulmonary hypertension, unspecified: Secondary | ICD-10-CM | POA: Insufficient documentation

## 2023-07-24 DIAGNOSIS — M25572 Pain in left ankle and joints of left foot: Secondary | ICD-10-CM

## 2023-07-24 DIAGNOSIS — I5032 Chronic diastolic (congestive) heart failure: Secondary | ICD-10-CM | POA: Insufficient documentation

## 2023-07-24 DIAGNOSIS — I69354 Hemiplegia and hemiparesis following cerebral infarction affecting left non-dominant side: Secondary | ICD-10-CM | POA: Diagnosis not present

## 2023-07-24 DIAGNOSIS — M6281 Muscle weakness (generalized): Secondary | ICD-10-CM

## 2023-07-24 MED ORDER — FUROSEMIDE 80 MG PO TABS: 80.0000 mg | ORAL_TABLET | ORAL | 3 refills | Status: AC

## 2023-07-24 MED ORDER — POTASSIUM CHLORIDE CRYS ER 20 MEQ PO TBCR: 20.0000 meq | EXTENDED_RELEASE_TABLET | Freq: Every day | ORAL | 3 refills | Status: AC

## 2023-07-24 NOTE — Therapy (Signed)
OUTPATIENT PHYSICAL THERAPY LOWER EXTREMITY TREATMENT        Patient Name: Karen Jackson MRN: [id number] DOB:September 12, 1940, 82 y.o., female Today's Date: 07/24/2023  END OF SESSION:  PT End of Session - 07/24/23 1103     Visit Number 19    Date for PT Re-Evaluation 08/21/23    PT Start Time 1100    PT Stop Time 1145    PT Time Calculation (min) 45 min    Activity Tolerance Patient tolerated treatment well    Behavior During Therapy [facility] for tasks assessed/performed             Past Medical History:  Diagnosis Date   Acute cystitis without hematuria    Acute diastolic CHF (congestive heart failure) (HCC)    Arthritis    Dyspnea    Dysrhythmia    Fever of unknown origin 03/19/2017   Hyperlipidemia    Hypertension    denies at preop   Multifocal pneumonia    Neuromuscular disorder (HCC)    neuropathy left arm and foot   Osteopenia    Paralysis (HCC)    partial left side from CVA    Persistent atrial fibrillation (HCC)    PONV (postoperative nausea and vomiting)    Pre-diabetes    Stroke [facility]) 2013   hemmorahgic   Past Surgical History:  Procedure Laterality Date   ANKLE SURGERY     APPENDECTOMY     CHOLECYSTECTOMY     HARDWARE REMOVAL Left 03/29/2023   Procedure: HARDWARE REMOVAL;  Surgeon: Toni Arthurs, MD;  Location: [facility];  Service: Orthopedics;  Laterality: Left;   HERNIA REPAIR     Esophagus   INCISION AND DRAINAGE OF WOUND Left 03/29/2023   Procedure: LEFT ANKLE WOUND IRRIGATION AND DEBRIDEMENT WOUND;  Surgeon: Toni Arthurs, MD;  Location: [facility];  Service: Orthopedics;  Laterality: Left;   JOINT REPLACEMENT     total- right partial- left   MASTECTOMY PARTIAL / LUMPECTOMY  2012   left   ORIF ANKLE FRACTURE Left 07/20/2018   Procedure: OPEN REDUCTION INTERNAL FIXATION (ORIF) ANKLE FRACTURE;  Surgeon: Toni Arthurs, MD;  Location: [facility];  Service: Orthopedics;  Laterality: Left;   TOTAL KNEE ARTHROPLASTY Left 01/27/2019    Procedure: TOTAL KNEE ARTHROPLASTY;  Surgeon: Dannielle Huh, MD;  Location: [facility];  Service: Orthopedics;  Laterality: Left;   Patient Active Problem List   Diagnosis Date Noted   Goals of care, counseling/discussion 01/17/2022   Grief 01/17/2022   Secondary hypercoagulable state (HCC) 03/16/2021   Dizziness 03/10/2021   Orthostatic hypotension 10/15/2020   Presbycusis of both ears 03/08/2020   Mixed stress and urge urinary incontinence 12/02/2019   Macrocytosis 12/01/2019   Nutritional anemia 12/01/2019   S/P total knee replacement 01/27/2019   Recurrent left knee instability 07/05/2018   Respiratory failure with hypoxia (HCC) 08/30/2017   Hypoxemia    Heart failure with preserved ejection fraction (HCC), Grade 3 diastolic dysfunction 03/26/2017   PAF (paroxysmal atrial fibrillation) (HCC)    Dyspnea 03/19/2017   Encounter for preventive health examination 02/17/2016   Sensorineural hearing loss (SNHL), bilateral 01/26/2016   Hypomagnesemia 04/24/2014   Hemiparesis affecting left side as late effect of cerebrovascular accident (HCC) 04/24/2014   Nontraumatic cerebral hemorrhage (HCC) 04/30/2012   DM (diabetes mellitus) with complications (HCC) 03/04/2010   OSTEOPENIA 01/21/2009   UNSPECIFIED VITAMIN D DEFICIENCY 11/19/2007   HYPERCHOLESTEROLEMIA 10/25/2006   GASTROESOPHAGEAL REFLUX, NO ESOPHAGITIS 10/25/2006   DIVERTICULOSIS OF COLON 10/25/2006  Osteoarthritis 10/25/2006   CERVICAL SPINE DISORDER, NOS 10/25/2006    PCP: Waynard Edwards, MD  REFERRING PROVIDER: Waynard Edwards, MD  REFERRING DIAG: s/p left ankle ORIF hardware removal  THERAPY DIAG:  Difficulty in walking, not elsewhere classified  Muscle weakness (generalized)  Ataxia  Hemiplegia and hemiparesis following cerebral infarction affecting left non-dominant side (HCC)  Pain in left ankle and joints of left foot  Rationale for Evaluation and Treatment: Rehabilitation  ONSET DATE: 03/29/23  SUBJECTIVE:   SUBJECTIVE  STATEMENT: "Well my shoulders hurt" Calf is swelling  PERTINENT HISTORY: CVA, CHF, HTN, A-fib, TKA PAIN:  Are you having pain? Yes: NPRS scale: 6/10 Pain location: left upper trap and neck, left ankle and knee  Pain description: ache sore Aggravating factors: neck and shoulder always hurt, knee and ankle hurt with transfers Relieving factors: massage, heat  PRECAUTIONS: None  RED FLAGS: None   WEIGHT BEARING RESTRICTIONS: No  FALLS:  Has patient fallen in last 6 months? No  LIVING ENVIRONMENT: Lives with: lives alone Lives in: House/apartment Stairs: No Has following equipment at home: Environmental consultant - 2 wheeled, Wheelchair (manual), shower chair, bed side commode, Grab bars, and Ramped entry  OCCUPATION: retired  PLOF: Needs assistance with homemaking, Needs assistance with transfers, and Leisure: plays bridge, she has 11 hours of an aide at home that helps with meals, going to MD's, dressing and bathing, is alone at night  PATIENT GOALS: walk, have less pain, transfer without difficulty  NEXT MD VISIT: none scheduled  OBJECTIVE:   DIAGNOSTIC FINDINGS: none performed  COGNITION: Overall cognitive status: Within functional limits for tasks assessed     SENSATION: WFL POSTURE: rounded shoulders and forward head  PALPATION: Left knee and ankle are swollen and tender, left upper and neck  LOWER EXTREMITY ROM:  Active ROM Right eval Left eval   Hip flexion     Hip extension     Hip abduction     Hip adduction     Hip internal rotation     Hip external rotation     Knee flexion  90 93  Knee extension  0 0  Ankle dorsiflexion  5   Ankle plantarflexion  30 44  Ankle inversion  5   Ankle eversion  0    (Blank rows = not tested)  LOWER EXTREMITY MMT:  MMT Right eval Left eval Left 06/21/23  Hip flexion 4- 3+ 4-  Hip extension     Hip abduction 4 3+ 4-  Hip adduction 4 3+ 4-  Hip internal rotation     Hip external rotation     Knee flexion 4- 3+ 4  Knee  extension 4- 3+ 4-  Ankle dorsiflexion     Ankle plantarflexion     Ankle inversion     Ankle eversion      (Blank rows = not tested)  FUNCTIONAL TESTS:  Transfers Max A, left knee gives out and goes into ER and tends to bow into varus, has not walked in about 18 months, and the last time I saw her we could walk about 60 feet with HHA and w/c following  GAIT: Distance walked: unable   TODAY'S TREATMENT:  DATE:  07/24/23 Nustep level 5 x 7 minutes 500 steps MHP UT cervical spine   Green tband Hip adduction left Green tband hip abduction left  Green tband HS curls  5# LAQ 5# marches Green tband ankle PF/DF Gait with HHA and W/C follow 50 feet x 1 today due to some knee soreness forgot brace  07/19/23 5# LAQ 5# marches STM to the upper traps, rhomboids and the neck  Nustep level 5 x 8 minutes c/o some knee pain today Green tband Hip adduction left Green tband hip abduction left Green tband HS curls Green tband ankle PF/DF Gait with HHA and W/C follow 50 feet x 1 today due to some knee and ankle soreness  07/17/23 Nustep level 5 x 7 minutes 500 steps 5# LAQ 5# marches Green tband PF/DF Green tband HS curls Green tband hip abduction Green tband hip adduction STM to the left upper trap and neck Gait with HHA and close W/C follow 45' x 2  07/12/23 Nustep level 5 x 7 minutes 500 steps Put heat on the shoulders while she was sitting and doing the LE ex's 5# LAQ bilateral 5# marches bilateral Left leg green tband HS curls Left red tband hip add and abduction all 3x10 each leg STM to the upper traps and into the neck Gait with HHA and w/c follow 45' x 2  07/10/23 STM UT, cervical spine, R rhomboids, approximation L digits  NuStep L5 x 7 min 500 steps Red tband left hip adduction Red tband left HS curls Red tband ankle PF/D 5# LAQ 5#  marches Gait with w/c follow 37' x2  07/05/23 STM to the neck and upper traps Nustep level 5 x 7 minutes 500 steps Red tband left hip adduction Red tband left HS curls Red tband ankle PF/D 5# LAQ 5# marches 5# LAQ Gait with w/c follow 37' x2   07/03/23 STM to the neck and upper traps Nustep level 5 x 7 minutes 500 steps Really did some education on the transfers, she tends to start a good turn of the transfer but gets almost all the way and then will not step back and leaves the left foot out in front and kind of falls back into the chair. Red tband left hip adduction Red tband left HS curls Red tband ankle PF/DF 5# LAQ 5# marches Gait with w/c follow 37' x2 Practice transfers  06/28/23 Nustep L 5 Approximation L hand  STM to L UT w/ theragun Green tband HS curls 2 sets 10 5# LAQ 2 sets 12 5# hip flex 2 sets 10 ADD ball squeeze Green tband clams  Gait x 30 feet with HHA and w/c follow 2 x   PATIENT EDUCATION:  Education details: POC Person educated: Patient Education method: Explanation Education comprehension: verbalized understanding  HOME EXERCISE PROGRAM: TBD  ASSESSMENT:  CLINICAL IMPRESSION: Pt having some shoulder and knee and ankle pain today, again. Pt forgot to wear knee brace.  She was able to go the 44' but only once today.  No issue aerobic warm up. Hip adduction was very taxing on pt. Cues to hold quad contraction with LLE doing LAQ. Positive response to STM and MHP   OBJECTIVE IMPAIRMENTS: Abnormal gait, cardiopulmonary status limiting activity, decreased activity tolerance, decreased balance, decreased coordination, decreased endurance, decreased mobility, difficulty walking, decreased ROM, decreased strength, increased edema, impaired flexibility, improper body mechanics, postural dysfunction, and pain.   REHAB POTENTIAL: Good  CLINICAL DECISION MAKING: Stable/uncomplicated  EVALUATION COMPLEXITY: Low  GOALS: Goals reviewed with  patient? Yes  SHORT TERM GOALS: Target date: 06/02/23 Independent with advanced HEP Goal status: met 06/07/23  LONG TERM GOALS: Target date: 08/21/23  Independent with advanced HEP with caregiver Goal status: ongoing 07/17/23  2.  Transfer with set up and CGA Goal status: Ongoing 07/12/23  3.  Walk HHA x 100 feet Goal status: Progressing 07/12/23  4.  Increase left LE strength to 4/5 Goal status: Progressing 07/18/23  5.  Report neck and shoulder pain decreased 50% Goal status: Ongoing 07/12/23  PLAN:  PT FREQUENCY: 1-2x/week  PT DURATION: 12 weeks  PLANNED INTERVENTIONS: Therapeutic exercises, Therapeutic activity, Neuromuscular re-education, Balance training, Gait training, Patient/Family education, Self Care, Joint mobilization, Moist heat, Taping, and Manual therapy  PLAN FOR NEXT SESSION: continue to work on her safe transfers and walking.   Grayce Sessions, PTA

## 2023-07-24 NOTE — Patient Instructions (Signed)
Medication Instructions:  Continue to take Lasix every other day and Potassium once daily as you have been.  *If you need a refill on your cardiac medications before your next appointment, please call your pharmacy*   Lab Work: BMET Magnesium to be done at PCP office.  If you have labs (blood work) drawn today and your tests are completely normal, you will receive your results only by: MyChart Message (if you have MyChart) OR A paper copy in the mail If you have any lab test that is abnormal or we need to change your treatment, we will call you to review the results.    Follow-Up: At [facility], you and your health needs are our priority.  As part of our continuing mission to provide you with exceptional heart care, we have created designated Provider Care Teams.  These Care Teams include your primary Cardiologist (physician) and Advanced Practice Providers (APPs -  Physician Assistants and Nurse Practitioners) who all work together to provide you with the care you need, when you need it.  Your next appointment:   6 month(s)  Provider:   Little Ishikawa, MD

## 2023-07-31 ENCOUNTER — Ambulatory Visit: Payer: Medicare Other | Attending: Internal Medicine | Admitting: Physical Therapy

## 2023-07-31 ENCOUNTER — Encounter: Payer: Self-pay | Admitting: Physical Therapy

## 2023-07-31 DIAGNOSIS — M25572 Pain in left ankle and joints of left foot: Secondary | ICD-10-CM | POA: Insufficient documentation

## 2023-07-31 DIAGNOSIS — M25512 Pain in left shoulder: Secondary | ICD-10-CM | POA: Insufficient documentation

## 2023-07-31 DIAGNOSIS — I69354 Hemiplegia and hemiparesis following cerebral infarction affecting left non-dominant side: Secondary | ICD-10-CM | POA: Diagnosis not present

## 2023-07-31 DIAGNOSIS — I6990 Unspecified sequelae of unspecified cerebrovascular disease: Secondary | ICD-10-CM | POA: Insufficient documentation

## 2023-07-31 DIAGNOSIS — R262 Difficulty in walking, not elsewhere classified: Secondary | ICD-10-CM | POA: Insufficient documentation

## 2023-07-31 DIAGNOSIS — M542 Cervicalgia: Secondary | ICD-10-CM | POA: Diagnosis not present

## 2023-07-31 DIAGNOSIS — M6281 Muscle weakness (generalized): Secondary | ICD-10-CM | POA: Diagnosis not present

## 2023-07-31 DIAGNOSIS — R270 Ataxia, unspecified: Secondary | ICD-10-CM | POA: Insufficient documentation

## 2023-07-31 DIAGNOSIS — R293 Abnormal posture: Secondary | ICD-10-CM | POA: Insufficient documentation

## 2023-07-31 NOTE — Therapy (Signed)
OUTPATIENT PHYSICAL THERAPY LOWER EXTREMITY TREATMENT Progress Note Reporting Period 06/26/23 to 07/31/23 for visits 11-20  See note below for Objective Data and Assessment of Progress/Goals.     Patient Name: Karen Jackson MRN: [id number] DOB:03-28-1941, 82 y.o., female Today's Date: 07/31/2023  END OF SESSION:  PT End of Session - 07/31/23 1108     Visit Number 20    Date for PT Re-Evaluation 08/21/23    PT Start Time 1104    PT Stop Time 1145    PT Time Calculation (min) 41 min    Activity Tolerance Patient tolerated treatment well    Behavior During Therapy [facility] for tasks assessed/performed             Past Medical History:  Diagnosis Date   Acute cystitis without hematuria    Acute diastolic CHF (congestive heart failure) (HCC)    Arthritis    Dyspnea    Dysrhythmia    Fever of unknown origin 03/19/2017   Hyperlipidemia    Hypertension    denies at preop   Multifocal pneumonia    Neuromuscular disorder (HCC)    neuropathy left arm and foot   Osteopenia    Paralysis (HCC)    partial left side from CVA    Persistent atrial fibrillation (HCC)    PONV (postoperative nausea and vomiting)    Pre-diabetes    Stroke [facility]) 2013   hemmorahgic   Past Surgical History:  Procedure Laterality Date   ANKLE SURGERY     APPENDECTOMY     CHOLECYSTECTOMY     HARDWARE REMOVAL Left 03/29/2023   Procedure: HARDWARE REMOVAL;  Surgeon: Toni Arthurs, MD;  Location: [facility];  Service: Orthopedics;  Laterality: Left;   HERNIA REPAIR     Esophagus   INCISION AND DRAINAGE OF WOUND Left 03/29/2023   Procedure: LEFT ANKLE WOUND IRRIGATION AND DEBRIDEMENT WOUND;  Surgeon: Toni Arthurs, MD;  Location: [facility];  Service: Orthopedics;  Laterality: Left;   JOINT REPLACEMENT     total- right partial- left   MASTECTOMY PARTIAL / LUMPECTOMY  2012   left   ORIF ANKLE FRACTURE Left 07/20/2018   Procedure: OPEN REDUCTION INTERNAL FIXATION (ORIF) ANKLE  FRACTURE;  Surgeon: Toni Arthurs, MD;  Location: [facility];  Service: Orthopedics;  Laterality: Left;   TOTAL KNEE ARTHROPLASTY Left 01/27/2019   Procedure: TOTAL KNEE ARTHROPLASTY;  Surgeon: Dannielle Huh, MD;  Location: [facility];  Service: Orthopedics;  Laterality: Left;   Patient Active Problem List   Diagnosis Date Noted   Goals of care, counseling/discussion 01/17/2022   Grief 01/17/2022   Secondary hypercoagulable state (HCC) 03/16/2021   Dizziness 03/10/2021   Orthostatic hypotension 10/15/2020   Presbycusis of both ears 03/08/2020   Mixed stress and urge urinary incontinence 12/02/2019   Macrocytosis 12/01/2019   Nutritional anemia 12/01/2019   S/P total knee replacement 01/27/2019   Recurrent left knee instability 07/05/2018   Respiratory failure with hypoxia (HCC) 08/30/2017   Hypoxemia    Heart failure with preserved ejection fraction (HCC), Grade 3 diastolic dysfunction 03/26/2017   PAF (paroxysmal atrial fibrillation) (HCC)    Dyspnea 03/19/2017   Encounter for preventive health examination 02/17/2016   Sensorineural hearing loss (SNHL), bilateral 01/26/2016   Hypomagnesemia 04/24/2014   Hemiparesis affecting left side as late effect of cerebrovascular accident (HCC) 04/24/2014   Nontraumatic cerebral hemorrhage (HCC) 04/30/2012   DM (diabetes mellitus) with complications (HCC) 03/04/2010   OSTEOPENIA 01/21/2009   UNSPECIFIED VITAMIN D DEFICIENCY  11/19/2007   HYPERCHOLESTEROLEMIA 10/25/2006   GASTROESOPHAGEAL REFLUX, NO ESOPHAGITIS 10/25/2006   DIVERTICULOSIS OF COLON 10/25/2006   Osteoarthritis 10/25/2006   CERVICAL SPINE DISORDER, NOS 10/25/2006    PCP: Waynard Edwards, MD  REFERRING PROVIDER: Waynard Edwards, MD  REFERRING DIAG: s/p left ankle ORIF hardware removal  THERAPY DIAG:  Muscle weakness (generalized)  Difficulty in walking, not elsewhere classified  Ataxia  Rationale for Evaluation and Treatment: Rehabilitation  ONSET DATE: 03/29/23  SUBJECTIVE:   SUBJECTIVE  STATEMENT: "Well my shoulders hurt" Calf is swelling  PERTINENT HISTORY: CVA, CHF, HTN, A-fib, TKA PAIN:  Are you having pain? Yes: NPRS scale: 6/10 Pain location: left upper trap and neck, left ankle and knee  Pain description: ache sore Aggravating factors: neck and shoulder always hurt, knee and ankle hurt with transfers Relieving factors: massage, heat  PRECAUTIONS: None  RED FLAGS: None   WEIGHT BEARING RESTRICTIONS: No  FALLS:  Has patient fallen in last 6 months? No  LIVING ENVIRONMENT: Lives with: lives alone Lives in: House/apartment Stairs: No Has following equipment at home: Environmental consultant - 2 wheeled, Wheelchair (manual), shower chair, bed side commode, Grab bars, and Ramped entry  OCCUPATION: retired  PLOF: Needs assistance with homemaking, Needs assistance with transfers, and Leisure: plays bridge, she has 11 hours of an aide at home that helps with meals, going to MD's, dressing and bathing, is alone at night  PATIENT GOALS: walk, have less pain, transfer without difficulty  NEXT MD VISIT: none scheduled  OBJECTIVE:   DIAGNOSTIC FINDINGS: none performed  COGNITION: Overall cognitive status: Within functional limits for tasks assessed     SENSATION: WFL POSTURE: rounded shoulders and forward head  PALPATION: Left knee and ankle are swollen and tender, left upper and neck  LOWER EXTREMITY ROM:  Active ROM Right eval Left eval  Left 07/31/23  Hip flexion      Hip extension      Hip abduction      Hip adduction      Hip internal rotation      Hip external rotation      Knee flexion  90 93 36  Knee extension  0 0 0  Ankle dorsiflexion  5    Ankle plantarflexion  30 44 49  Ankle inversion  5    Ankle eversion  0     (Blank rows = not tested)  LOWER EXTREMITY MMT:  MMT Right eval Left eval Left 06/21/23 Left 07/31/23  Hip flexion 4- 3+ 4- 4  Hip extension      Hip abduction 4 3+ 4- 4+  Hip adduction 4 3+ 4- 4  Hip internal rotation      Hip  external rotation      Knee flexion 4- 3+ 4 4  Knee extension 4- 3+ 4- 4  Ankle dorsiflexion      Ankle plantarflexion      Ankle inversion      Ankle eversion       (Blank rows = not tested)  FUNCTIONAL TESTS:  Transfers Max A, left knee gives out and goes into ER and tends to bow into varus, has not walked in about 18 months, and the last time I saw her we could walk about 60 feet with HHA and w/c following  GAIT: Distance walked: unable   TODAY'S TREATMENT:  DATE:  07/31/23 Nustep level 5 x 7 minutes 500 steps MHP UT cervical spine   Green tband Hip adduction left Green tband hip abduction left  Green tband HS curls  5# LAQ 5# marches Gait with HHA and W/C follow 70 feet x 1 again no L knee brace  07/24/23 Nustep level 5 x 7 minutes 500 steps MHP UT cervical spine   Green tband Hip adduction left Green tband hip abduction left  Green tband HS curls  5# LAQ 5# marches Green tband ankle PF/DF Gait with HHA and W/C follow 50 feet x 1 today due to some knee soreness forgot brace  07/19/23 5# LAQ 5# marches STM to the upper traps, rhomboids and the neck  Nustep level 5 x 8 minutes c/o some knee pain today Green tband Hip adduction left Green tband hip abduction left Green tband HS curls Green tband ankle PF/DF Gait with HHA and W/C follow 50 feet x 1 today due to some knee and ankle soreness  07/17/23 Nustep level 5 x 7 minutes 500 steps 5# LAQ 5# marches Green tband PF/DF Green tband HS curls Green tband hip abduction Green tband hip adduction STM to the left upper trap and neck Gait with HHA and close W/C follow 45' x 2  07/12/23 Nustep level 5 x 7 minutes 500 steps Put heat on the shoulders while she was sitting and doing the LE ex's 5# LAQ bilateral 5# marches bilateral Left leg green tband HS curls Left red tband hip add and  abduction all 3x10 each leg STM to the upper traps and into the neck Gait with HHA and w/c follow 45' x 2  07/10/23 STM UT, cervical spine, R rhomboids, approximation L digits  NuStep L5 x 7 min 500 steps Red tband left hip adduction Red tband left HS curls Red tband ankle PF/D 5# LAQ 5# marches Gait with w/c follow 37' x2  07/05/23 STM to the neck and upper traps Nustep level 5 x 7 minutes 500 steps Red tband left hip adduction Red tband left HS curls Red tband ankle PF/D 5# LAQ 5# marches 5# LAQ Gait with w/c follow 37' x2   07/03/23 STM to the neck and upper traps Nustep level 5 x 7 minutes 500 steps Really did some education on the transfers, she tends to start a good turn of the transfer but gets almost all the way and then will not step back and leaves the left foot out in front and kind of falls back into the chair. Red tband left hip adduction Red tband left HS curls Red tband ankle PF/DF 5# LAQ 5# marches Gait with w/c follow 37' x2 Practice transfers  06/28/23 Nustep L 5 Approximation L hand  STM to L UT w/ theragun Green tband HS curls 2 sets 10 5# LAQ 2 sets 12 5# hip flex 2 sets 10 ADD ball squeeze Green tband clams  Gait x 30 feet with HHA and w/c follow 2 x   PATIENT EDUCATION:  Education details: POC Person educated: Patient Education method: Explanation Education comprehension: verbalized understanding  HOME EXERCISE PROGRAM: TBD  ASSESSMENT:  CLINICAL IMPRESSION: Pt with a slight improvement with shoulder and neck pain. She has progressed increasing the strengh tog her LLE. Pt also progressed with er single trials gait distance.  No issue aerobic warm up. Hip adduction was very taxing on pt despite strength increase. . Cues to hold quad contraction with LLE doing LAQ. Positive response to STM and  MHP. Progressing towards goals.   OBJECTIVE IMPAIRMENTS: Abnormal gait, cardiopulmonary status limiting activity, decreased activity  tolerance, decreased balance, decreased coordination, decreased endurance, decreased mobility, difficulty walking, decreased ROM, decreased strength, increased edema, impaired flexibility, improper body mechanics, postural dysfunction, and pain.   REHAB POTENTIAL: Good  CLINICAL DECISION MAKING: Stable/uncomplicated  EVALUATION COMPLEXITY: Low   GOALS: Goals reviewed with patient? Yes  SHORT TERM GOALS: Target date: 06/02/23 Independent with advanced HEP Goal status: met 06/07/23  LONG TERM GOALS: Target date: 08/21/23  Independent with advanced HEP with caregiver Goal status: ongoing 07/17/23  2.  Transfer with set up and CGA Goal status: Ongoing 07/30/23  3.  Walk HHA x 100 feet Goal status: Progressing 07/12/23  4.  Increase left LE strength to 4/5 Goal status: Met 07/31/23  5.  Report neck and shoulder pain decreased 50% Goal status: Progressing 30% 07/31/23  PLAN:  PT FREQUENCY: 1-2x/week  PT DURATION: 12 weeks  PLANNED INTERVENTIONS: Therapeutic exercises, Therapeutic activity, Neuromuscular re-education, Balance training, Gait training, Patient/Family education, Self Care, Joint mobilization, Moist heat, Taping, and Manual therapy  PLAN FOR NEXT SESSION: continue to work on her safe transfers and walking.   Grayce Sessions, PTA

## 2023-08-02 ENCOUNTER — Encounter: Payer: Self-pay | Admitting: Physical Therapy

## 2023-08-02 ENCOUNTER — Ambulatory Visit: Payer: Medicare Other | Admitting: Physical Therapy

## 2023-08-02 DIAGNOSIS — M25572 Pain in left ankle and joints of left foot: Secondary | ICD-10-CM

## 2023-08-02 DIAGNOSIS — R262 Difficulty in walking, not elsewhere classified: Secondary | ICD-10-CM | POA: Diagnosis not present

## 2023-08-02 DIAGNOSIS — M6281 Muscle weakness (generalized): Secondary | ICD-10-CM | POA: Diagnosis not present

## 2023-08-02 DIAGNOSIS — M25512 Pain in left shoulder: Secondary | ICD-10-CM | POA: Diagnosis not present

## 2023-08-02 DIAGNOSIS — I69354 Hemiplegia and hemiparesis following cerebral infarction affecting left non-dominant side: Secondary | ICD-10-CM | POA: Diagnosis not present

## 2023-08-02 DIAGNOSIS — R270 Ataxia, unspecified: Secondary | ICD-10-CM | POA: Diagnosis not present

## 2023-08-02 NOTE — Therapy (Signed)
OUTPATIENT PHYSICAL THERAPY LOWER EXTREMITY TREATMENT   Patient Name: Karen Jackson MRN: [id number] DOB:[birth year], 82 y.o., female Today's Date: 08/02/2023  END OF SESSION:  PT End of Session - 08/02/23 1102     Visit Number 21    Date for PT Re-Evaluation 08/21/23    PT Start Time 1100    PT Stop Time 1145    PT Time Calculation (min) 45 min    Activity Tolerance Patient tolerated treatment well    Behavior During Therapy [facility] for tasks assessed/performed             Past Medical History:  Diagnosis Date   Acute cystitis without hematuria    Acute diastolic CHF (congestive heart failure) (HCC)    Arthritis    Dyspnea    Dysrhythmia    Fever of unknown origin 03/19/2017   Hyperlipidemia    Hypertension    denies at preop   Multifocal pneumonia    Neuromuscular disorder (HCC)    neuropathy left arm and foot   Osteopenia    Paralysis (HCC)    partial left side from CVA    Persistent atrial fibrillation (HCC)    PONV (postoperative nausea and vomiting)    Pre-diabetes    Stroke [facility]) 2013   hemmorahgic   Past Surgical History:  Procedure Laterality Date   ANKLE SURGERY     APPENDECTOMY     CHOLECYSTECTOMY     HARDWARE REMOVAL Left 03/29/2023   Procedure: HARDWARE REMOVAL;  Surgeon: Toni Arthurs, MD;  Location: [facility];  Service: Orthopedics;  Laterality: Left;   HERNIA REPAIR     Esophagus   INCISION AND DRAINAGE OF WOUND Left 03/29/2023   Procedure: LEFT ANKLE WOUND IRRIGATION AND DEBRIDEMENT WOUND;  Surgeon: Toni Arthurs, MD;  Location: [facility];  Service: Orthopedics;  Laterality: Left;   JOINT REPLACEMENT     total- right partial- left   MASTECTOMY PARTIAL / LUMPECTOMY  2012   left   ORIF ANKLE FRACTURE Left 07/20/2018   Procedure: OPEN REDUCTION INTERNAL FIXATION (ORIF) ANKLE FRACTURE;  Surgeon: Toni Arthurs, MD;  Location: [facility];  Service: Orthopedics;  Laterality: Left;   TOTAL KNEE ARTHROPLASTY Left 01/27/2019    Procedure: TOTAL KNEE ARTHROPLASTY;  Surgeon: Dannielle Huh, MD;  Location: [facility];  Service: Orthopedics;  Laterality: Left;   Patient Active Problem List   Diagnosis Date Noted   Goals of care, counseling/discussion 01/17/2022   Grief 01/17/2022   Secondary hypercoagulable state (HCC) 03/16/2021   Dizziness 03/10/2021   Orthostatic hypotension 10/15/2020   Presbycusis of both ears 03/08/2020   Mixed stress and urge urinary incontinence 12/02/2019   Macrocytosis 12/01/2019   Nutritional anemia 12/01/2019   S/P total knee replacement 01/27/2019   Recurrent left knee instability 07/05/2018   Respiratory failure with hypoxia (HCC) 08/30/2017   Hypoxemia    Heart failure with preserved ejection fraction (HCC), Grade 3 diastolic dysfunction 03/26/2017   PAF (paroxysmal atrial fibrillation) (HCC)    Dyspnea 03/19/2017   Encounter for preventive health examination 02/17/2016   Sensorineural hearing loss (SNHL), bilateral 01/26/2016   Hypomagnesemia 04/24/2014   Hemiparesis affecting left side as late effect of cerebrovascular accident (HCC) 04/24/2014   Nontraumatic cerebral hemorrhage (HCC) 04/30/2012   DM (diabetes mellitus) with complications (HCC) 03/04/2010   OSTEOPENIA 01/21/2009   UNSPECIFIED VITAMIN D DEFICIENCY 11/19/2007   HYPERCHOLESTEROLEMIA 10/25/2006   GASTROESOPHAGEAL REFLUX, NO ESOPHAGITIS 10/25/2006   DIVERTICULOSIS OF COLON 10/25/2006   Osteoarthritis 10/25/2006  CERVICAL SPINE DISORDER, NOS 10/25/2006    PCP: Waynard Edwards, MD  REFERRING PROVIDER: Waynard Edwards, MD  REFERRING DIAG: s/p left ankle ORIF hardware removal  THERAPY DIAG:  Muscle weakness (generalized)  Difficulty in walking, not elsewhere classified  Pain in left ankle and joints of left foot  Rationale for Evaluation and Treatment: Rehabilitation  ONSET DATE: 03/29/23  SUBJECTIVE:   SUBJECTIVE STATEMENT: Shoulder and arms are sore, knee is sore from cold weather  PERTINENT HISTORY: CVA, CHF, HTN,  A-fib, TKA PAIN:  Are you having pain? Yes: NPRS scale: 6/10 Pain location: left upper trap and neck, left ankle and knee  Pain description: ache sore Aggravating factors: neck and shoulder always hurt, knee and ankle hurt with transfers Relieving factors: massage, heat  PRECAUTIONS: None  RED FLAGS: None   WEIGHT BEARING RESTRICTIONS: No  FALLS:  Has patient fallen in last 6 months? No  LIVING ENVIRONMENT: Lives with: lives alone Lives in: House/apartment Stairs: No Has following equipment at home: Environmental consultant - 2 wheeled, Wheelchair (manual), shower chair, bed side commode, Grab bars, and Ramped entry  OCCUPATION: retired  PLOF: Needs assistance with homemaking, Needs assistance with transfers, and Leisure: plays bridge, she has 11 hours of an aide at home that helps with meals, going to MD's, dressing and bathing, is alone at night  PATIENT GOALS: walk, have less pain, transfer without difficulty  NEXT MD VISIT: none scheduled  OBJECTIVE:   DIAGNOSTIC FINDINGS: none performed  COGNITION: Overall cognitive status: Within functional limits for tasks assessed     SENSATION: WFL POSTURE: rounded shoulders and forward head  PALPATION: Left knee and ankle are swollen and tender, left upper and neck  LOWER EXTREMITY ROM:  Active ROM Right eval Left eval  Left 07/31/23  Hip flexion      Hip extension      Hip abduction      Hip adduction      Hip internal rotation      Hip external rotation      Knee flexion  90 93 36  Knee extension  0 0 0  Ankle dorsiflexion  5    Ankle plantarflexion  30 44 49  Ankle inversion  5    Ankle eversion  0     (Blank rows = not tested)  LOWER EXTREMITY MMT:  MMT Right eval Left eval Left 06/21/23 Left 07/31/23  Hip flexion 4- 3+ 4- 4  Hip extension      Hip abduction 4 3+ 4- 4+  Hip adduction 4 3+ 4- 4  Hip internal rotation      Hip external rotation      Knee flexion 4- 3+ 4 4  Knee extension 4- 3+ 4- 4  Ankle  dorsiflexion      Ankle plantarflexion      Ankle inversion      Ankle eversion       (Blank rows = not tested)  FUNCTIONAL TESTS:  Transfers Max A, left knee gives out and goes into ER and tends to bow into varus, has not walked in about 18 months, and the last time I saw her we could walk about 60 feet with HHA and w/c following  GAIT: Distance walked: unable   TODAY'S TREATMENT:  DATE:  08/02/23 Nustep level 5 x 7 minutes 500 steps STM to UT and cervical spine  MHP UT cervical spine   Green tband Hip adduction left Green tband hip abduction left  Green tband HS curls  5# LAQ 5# marches Green tband HS curls Gait with HHA and W/C follow 70 feet x , then 61ft   07/31/23 Nustep level 5 x 7 minutes 500 steps MHP UT cervical spine   Green tband Hip adduction left Green tband hip abduction left  Green tband HS curls  5# LAQ 5# marches Gait with HHA and W/C follow 70 feet x 1 again no L knee brace  07/24/23 Nustep level 5 x 7 minutes 500 steps MHP UT cervical spine   Green tband Hip adduction left Green tband hip abduction left  Green tband HS curls  5# LAQ 5# marches Green tband ankle PF/DF Gait with HHA and W/C follow 50 feet x 1 today due to some knee soreness forgot brace  07/19/23 5# LAQ 5# marches STM to the upper traps, rhomboids and the neck  Nustep level 5 x 8 minutes c/o some knee pain today Green tband Hip adduction left Green tband hip abduction left Green tband HS curls Green tband ankle PF/DF Gait with HHA and W/C follow 50 feet x 1 today due to some knee and ankle soreness  07/17/23 Nustep level 5 x 7 minutes 500 steps 5# LAQ 5# marches Green tband PF/DF Green tband HS curls Green tband hip abduction Green tband hip adduction STM to the left upper trap and neck Gait with HHA and close W/C follow 45' x  2  07/12/23 Nustep level 5 x 7 minutes 500 steps Put heat on the shoulders while she was sitting and doing the LE ex's 5# LAQ bilateral 5# marches bilateral Left leg green tband HS curls Left red tband hip add and abduction all 3x10 each leg STM to the upper traps and into the neck Gait with HHA and w/c follow 45' x 2  07/10/23 STM UT, cervical spine, R rhomboids, approximation L digits  NuStep L5 x 7 min 500 steps Red tband left hip adduction Red tband left HS curls Red tband ankle PF/D 5# LAQ 5# marches Gait with w/c follow 37' x2  07/05/23 STM to the neck and upper traps Nustep level 5 x 7 minutes 500 steps Red tband left hip adduction Red tband left HS curls Red tband ankle PF/D 5# LAQ 5# marches 5# LAQ Gait with w/c follow 77' x2    PATIENT EDUCATION:  Education details: POC Person educated: Patient Education method: Explanation Education comprehension: verbalized understanding  HOME EXERCISE PROGRAM: TBD  ASSESSMENT:  CLINICAL IMPRESSION: Pt enters with reports of neck and shoulder pain. This was helped by [facility] and MHP. Pt demonstrated better activity tolerance with gait.  No issue aerobic warm up. Hip adduction was very taxing on pt despite strength increase. Cues to hold quad contraction with LLE doing LAQ. Some L side neglect noted during session.   OBJECTIVE IMPAIRMENTS: Abnormal gait, cardiopulmonary status limiting activity, decreased activity tolerance, decreased balance, decreased coordination, decreased endurance, decreased mobility, difficulty walking, decreased ROM, decreased strength, increased edema, impaired flexibility, improper body mechanics, postural dysfunction, and pain.   REHAB POTENTIAL: Good  CLINICAL DECISION MAKING: Stable/uncomplicated  EVALUATION COMPLEXITY: Low   GOALS: Goals reviewed with patient? Yes  SHORT TERM GOALS: Target date: 06/02/23 Independent with advanced HEP Goal status: met 06/07/23  LONG TERM GOALS: Target  date: 08/21/23  Independent with advanced HEP with caregiver Goal status: ongoing 07/17/23  2.  Transfer with set up and CGA Goal status: Ongoing 07/30/23  3.  Walk HHA x 100 feet Goal status: Progressing 07/12/23  4.  Increase left LE strength to 4/5 Goal status: Met 07/31/23  5.  Report neck and shoulder pain decreased 50% Goal status: Progressing 30% 07/31/23  PLAN:  PT FREQUENCY: 1-2x/week  PT DURATION: 12 weeks  PLANNED INTERVENTIONS: Therapeutic exercises, Therapeutic activity, Neuromuscular re-education, Balance training, Gait training, Patient/Family education, Self Care, Joint mobilization, Moist heat, Taping, and Manual therapy  PLAN FOR NEXT SESSION: continue to work on her safe transfers and walking.   Grayce Sessions, PTA

## 2023-08-06 ENCOUNTER — Ambulatory Visit: Payer: Medicare Other | Admitting: Physical Therapy

## 2023-08-06 ENCOUNTER — Encounter: Payer: Self-pay | Admitting: Physical Therapy

## 2023-08-06 DIAGNOSIS — M6281 Muscle weakness (generalized): Secondary | ICD-10-CM

## 2023-08-06 DIAGNOSIS — M542 Cervicalgia: Secondary | ICD-10-CM

## 2023-08-06 DIAGNOSIS — R270 Ataxia, unspecified: Secondary | ICD-10-CM

## 2023-08-06 DIAGNOSIS — R262 Difficulty in walking, not elsewhere classified: Secondary | ICD-10-CM | POA: Diagnosis not present

## 2023-08-06 DIAGNOSIS — I69354 Hemiplegia and hemiparesis following cerebral infarction affecting left non-dominant side: Secondary | ICD-10-CM

## 2023-08-06 DIAGNOSIS — R293 Abnormal posture: Secondary | ICD-10-CM

## 2023-08-06 DIAGNOSIS — M25572 Pain in left ankle and joints of left foot: Secondary | ICD-10-CM

## 2023-08-06 DIAGNOSIS — M25512 Pain in left shoulder: Secondary | ICD-10-CM

## 2023-08-06 DIAGNOSIS — I6990 Unspecified sequelae of unspecified cerebrovascular disease: Secondary | ICD-10-CM

## 2023-08-06 NOTE — Therapy (Signed)
OUTPATIENT PHYSICAL THERAPY LOWER EXTREMITY TREATMENT   Patient Name: Karen Jackson MRN: [id number] DOB:March 30, 1941, 82 y.o., female Today's Date: 08/06/2023  END OF SESSION:  PT End of Session - 08/06/23 1019     Visit Number 22    Date for PT Re-Evaluation 08/21/23    Authorization Type Medicare    PT Start Time 1014    PT Stop Time 1100    PT Time Calculation (min) 46 min    Activity Tolerance Patient tolerated treatment well    Behavior During Therapy WFL for tasks assessed/performed             Past Medical History:  Diagnosis Date   Acute cystitis without hematuria    Acute diastolic CHF (congestive heart failure) (HCC)    Arthritis    Dyspnea    Dysrhythmia    Fever of unknown origin 03/19/2017   Hyperlipidemia    Hypertension    denies at preop   Multifocal pneumonia    Neuromuscular disorder (HCC)    neuropathy left arm and foot   Osteopenia    Paralysis (HCC)    partial left side from CVA    Persistent atrial fibrillation (HCC)    PONV (postoperative nausea and vomiting)    Pre-diabetes    Stroke [facility]) 2013   hemmorahgic   Past Surgical History:  Procedure Laterality Date   ANKLE SURGERY     APPENDECTOMY     CHOLECYSTECTOMY     HARDWARE REMOVAL Left 03/29/2023   Procedure: HARDWARE REMOVAL;  Surgeon: Toni Arthurs, MD;  Location: [facility];  Service: Orthopedics;  Laterality: Left;   HERNIA REPAIR     Esophagus   INCISION AND DRAINAGE OF WOUND Left 03/29/2023   Procedure: LEFT ANKLE WOUND IRRIGATION AND DEBRIDEMENT WOUND;  Surgeon: Toni Arthurs, MD;  Location: [facility];  Service: Orthopedics;  Laterality: Left;   JOINT REPLACEMENT     total- right partial- left   MASTECTOMY PARTIAL / LUMPECTOMY  2012   left   ORIF ANKLE FRACTURE Left 07/20/2018   Procedure: OPEN REDUCTION INTERNAL FIXATION (ORIF) ANKLE FRACTURE;  Surgeon: Toni Arthurs, MD;  Location: [facility];  Service: Orthopedics;  Laterality: Left;   TOTAL KNEE  ARTHROPLASTY Left 01/27/2019   Procedure: TOTAL KNEE ARTHROPLASTY;  Surgeon: Dannielle Huh, MD;  Location: [facility];  Service: Orthopedics;  Laterality: Left;   Patient Active Problem List   Diagnosis Date Noted   Goals of care, counseling/discussion 01/17/2022   Grief 01/17/2022   Secondary hypercoagulable state (HCC) 03/16/2021   Dizziness 03/10/2021   Orthostatic hypotension 10/15/2020   Presbycusis of both ears 03/08/2020   Mixed stress and urge urinary incontinence 12/02/2019   Macrocytosis 12/01/2019   Nutritional anemia 12/01/2019   S/P total knee replacement 01/27/2019   Recurrent left knee instability 07/05/2018   Respiratory failure with hypoxia (HCC) 08/30/2017   Hypoxemia    Heart failure with preserved ejection fraction (HCC), Grade 3 diastolic dysfunction 03/26/2017   PAF (paroxysmal atrial fibrillation) (HCC)    Dyspnea 03/19/2017   Encounter for preventive health examination 02/17/2016   Sensorineural hearing loss (SNHL), bilateral 01/26/2016   Hypomagnesemia 04/24/2014   Hemiparesis affecting left side as late effect of cerebrovascular accident (HCC) 04/24/2014   Nontraumatic cerebral hemorrhage (HCC) 04/30/2012   DM (diabetes mellitus) with complications (HCC) 03/04/2010   OSTEOPENIA 01/21/2009   UNSPECIFIED VITAMIN D DEFICIENCY 11/19/2007   HYPERCHOLESTEROLEMIA 10/25/2006   GASTROESOPHAGEAL REFLUX, NO ESOPHAGITIS 10/25/2006   DIVERTICULOSIS OF COLON  10/25/2006   Osteoarthritis 10/25/2006   CERVICAL SPINE DISORDER, NOS 10/25/2006    PCP: Waynard Edwards, MD  REFERRING PROVIDER: Waynard Edwards, MD  REFERRING DIAG: s/p left ankle ORIF hardware removal  THERAPY DIAG:  Muscle weakness (generalized)  Difficulty in walking, not elsewhere classified  Pain in left ankle and joints of left foot  Ataxia  Hemiplegia and hemiparesis following cerebral infarction affecting left non-dominant side (HCC)  Late effects of CVA (cerebrovascular accident)  Cervicalgia  Left shoulder  pain, unspecified chronicity  Abnormal posture  Rationale for Evaluation and Treatment: Rehabilitation  ONSET DATE: 03/29/23  SUBJECTIVE:   SUBJECTIVE STATEMENT: Patient reports that she has a different caregiver and has some pain I the ankle reports that the brace does not feel right  PERTINENT HISTORY: CVA, CHF, HTN, A-fib, TKA PAIN:  Are you having pain? Yes: NPRS scale: 6/10 Pain location: left upper trap and neck, left ankle and knee  Pain description: ache sore Aggravating factors: neck and shoulder always hurt, knee and ankle hurt with transfers Relieving factors: massage, heat  PRECAUTIONS: None  RED FLAGS: None   WEIGHT BEARING RESTRICTIONS: No  FALLS:  Has patient fallen in last 6 months? No  LIVING ENVIRONMENT: Lives with: lives alone Lives in: House/apartment Stairs: No Has following equipment at home: Environmental consultant - 2 wheeled, Wheelchair (manual), shower chair, bed side commode, Grab bars, and Ramped entry  OCCUPATION: retired  PLOF: Needs assistance with homemaking, Needs assistance with transfers, and Leisure: plays bridge, she has 11 hours of an aide at home that helps with meals, going to MD's, dressing and bathing, is alone at night  PATIENT GOALS: walk, have less pain, transfer without difficulty  NEXT MD VISIT: none scheduled  OBJECTIVE:   DIAGNOSTIC FINDINGS: none performed  COGNITION: Overall cognitive status: Within functional limits for tasks assessed     SENSATION: WFL POSTURE: rounded shoulders and forward head  PALPATION: Left knee and ankle are swollen and tender, left upper and neck  LOWER EXTREMITY ROM:  Active ROM Right eval Left eval  Left 07/31/23  Hip flexion      Hip extension      Hip abduction      Hip adduction      Hip internal rotation      Hip external rotation      Knee flexion  90 93 36  Knee extension  0 0 0  Ankle dorsiflexion  5    Ankle plantarflexion  30 44 49  Ankle inversion  5    Ankle eversion  0      (Blank rows = not tested)  LOWER EXTREMITY MMT:  MMT Right eval Left eval Left 06/21/23 Left 07/31/23  Hip flexion 4- 3+ 4- 4  Hip extension      Hip abduction 4 3+ 4- 4+  Hip adduction 4 3+ 4- 4  Hip internal rotation      Hip external rotation      Knee flexion 4- 3+ 4 4  Knee extension 4- 3+ 4- 4  Ankle dorsiflexion      Ankle plantarflexion      Ankle inversion      Ankle eversion       (Blank rows = not tested)  FUNCTIONAL TESTS:  Transfers Max A, left knee gives out and goes into ER and tends to bow into varus, has not walked in about 18 months, and the last time I saw her we could walk about 60 feet with HHA and w/c following  GAIT: Distance walked: unable   TODAY'S TREATMENT:                                                                                                                              DATE:  08/06/23 I took off brace and did a refitting, she had a new caregiver today Standing weight shifts Nustep x 8 minutes 550 steps STM to the left upper trap and neck area 5# marches 5# LAQ Green tband hip adduction and abduction left only to get good contraction Green tband HS curl Gait with HHA 2x 55 feet with w/c follow  08/02/23 Nustep level 5 x 7 minutes 500 steps STM to UT and cervical spine  MHP UT cervical spine   Green tband Hip adduction left Green tband hip abduction left  Green tband HS curls  5# LAQ 5# marches Green tband HS curls Gait with HHA and W/C follow 70 feet x , then 75ft   07/31/23 Nustep level 5 x 7 minutes 500 steps MHP UT cervical spine   Green tband Hip adduction left Green tband hip abduction left  Green tband HS curls  5# LAQ 5# marches Gait with HHA and W/C follow 70 feet x 1 again no L knee brace  07/24/23 Nustep level 5 x 7 minutes 500 steps MHP UT cervical spine   Green tband Hip adduction left Green tband hip abduction left  Green tband HS curls  5# LAQ 5# marches Green tband ankle PF/DF Gait with HHA and  W/C follow 50 feet x 1 today due to some knee soreness forgot brace  07/19/23 5# LAQ 5# marches STM to the upper traps, rhomboids and the neck  Nustep level 5 x 8 minutes c/o some knee pain today Green tband Hip adduction left Green tband hip abduction left Green tband HS curls Green tband ankle PF/DF Gait with HHA and W/C follow 50 feet x 1 today due to some knee and ankle soreness  07/17/23 Nustep level 5 x 7 minutes 500 steps 5# LAQ 5# marches Green tband PF/DF Green tband HS curls Green tband hip abduction Green tband hip adduction STM to the left upper trap and neck Gait with HHA and close W/C follow 45' x 2  07/12/23 Nustep level 5 x 7 minutes 500 steps Put heat on the shoulders while she was sitting and doing the LE ex's 5# LAQ bilateral 5# marches bilateral Left leg green tband HS curls Left red tband hip add and abduction all 3x10 each leg STM to the upper traps and into the neck Gait with HHA and w/c follow 45' x 2  07/10/23 STM UT, cervical spine, R rhomboids, approximation L digits  NuStep L5 x 7 min 500 steps Red tband left hip adduction Red tband left HS curls Red tband ankle PF/D 5# LAQ 5# marches Gait with w/c follow 37' x2  07/05/23 STM to the neck and upper traps Nustep level 5 x 7 minutes  500 steps Red tband left hip adduction Red tband left HS curls Red tband ankle PF/D 5# LAQ 5# marches 5# LAQ Gait with w/c follow 14' x2    PATIENT EDUCATION:  Education details: POC Person educated: Patient Education method: Explanation Education comprehension: verbalized understanding  HOME EXERCISE PROGRAM: TBD  ASSESSMENT:  CLINICAL IMPRESSION: Pt had some left ankle pain today, she had a new caregiver and she felt like the brace was not on correctly, I adjusted it for her.  We did some weight shifts today as I noticed that the left knee was giving way, she did not have the knee brace on as she usually does.  We did add a few extra feet with  the walking and she did okay, stopped due to some knee tenderness and fatigue  OBJECTIVE IMPAIRMENTS: Abnormal gait, cardiopulmonary status limiting activity, decreased activity tolerance, decreased balance, decreased coordination, decreased endurance, decreased mobility, difficulty walking, decreased ROM, decreased strength, increased edema, impaired flexibility, improper body mechanics, postural dysfunction, and pain.   REHAB POTENTIAL: Good  CLINICAL DECISION MAKING: Stable/uncomplicated  EVALUATION COMPLEXITY: Low   GOALS: Goals reviewed with patient? Yes  SHORT TERM GOALS: Target date: 06/02/23 Independent with advanced HEP Goal status: met 06/07/23  LONG TERM GOALS: Target date: 08/21/23  Independent with advanced HEP with caregiver Goal status: ongoing 07/17/23  2.  Transfer with set up and CGA Goal status: Ongoing 07/30/23  3.  Walk HHA x 100 feet Goal status: Progressing 07/12/23  4.  Increase left LE strength to 4/5 Goal status: Met 07/31/23  5.  Report neck and shoulder pain decreased 50% Goal status: Progressing 30% 07/31/23  PLAN:  PT FREQUENCY: 1-2x/week  PT DURATION: 12 weeks  PLANNED INTERVENTIONS: Therapeutic exercises, Therapeutic activity, Neuromuscular re-education, Balance training, Gait training, Patient/Family education, Self Care, Joint mobilization, Moist heat, Taping, and Manual therapy  PLAN FOR NEXT SESSION: continue to work on her safe transfers and walking.   Jearld Lesch, PT

## 2023-08-09 ENCOUNTER — Ambulatory Visit: Payer: Medicare Other | Admitting: Physical Therapy

## 2023-08-09 ENCOUNTER — Encounter: Payer: Self-pay | Admitting: Physical Therapy

## 2023-08-09 DIAGNOSIS — M25572 Pain in left ankle and joints of left foot: Secondary | ICD-10-CM | POA: Diagnosis not present

## 2023-08-09 DIAGNOSIS — M25512 Pain in left shoulder: Secondary | ICD-10-CM

## 2023-08-09 DIAGNOSIS — R262 Difficulty in walking, not elsewhere classified: Secondary | ICD-10-CM

## 2023-08-09 DIAGNOSIS — R270 Ataxia, unspecified: Secondary | ICD-10-CM

## 2023-08-09 DIAGNOSIS — M6281 Muscle weakness (generalized): Secondary | ICD-10-CM | POA: Diagnosis not present

## 2023-08-09 DIAGNOSIS — I69354 Hemiplegia and hemiparesis following cerebral infarction affecting left non-dominant side: Secondary | ICD-10-CM | POA: Diagnosis not present

## 2023-08-09 DIAGNOSIS — M542 Cervicalgia: Secondary | ICD-10-CM

## 2023-08-09 DIAGNOSIS — I6990 Unspecified sequelae of unspecified cerebrovascular disease: Secondary | ICD-10-CM

## 2023-08-09 NOTE — Therapy (Signed)
OUTPATIENT PHYSICAL THERAPY LOWER EXTREMITY TREATMENT   Patient Name: Karen Jackson MRN: [id number] DOB:1941/08/09, 82 y.o., female Today's Date: 08/09/2023  END OF SESSION:  PT End of Session - 08/09/23 1016     Visit Number 23    Date for PT Re-Evaluation 08/21/23    Authorization Type Medicare    PT Start Time 1013    PT Stop Time 1100    PT Time Calculation (min) 47 min    Activity Tolerance Patient tolerated treatment well    Behavior During Therapy WFL for tasks assessed/performed             Past Medical History:  Diagnosis Date   Acute cystitis without hematuria    Acute diastolic CHF (congestive heart failure) (HCC)    Arthritis    Dyspnea    Dysrhythmia    Fever of unknown origin 03/19/2017   Hyperlipidemia    Hypertension    denies at preop   Multifocal pneumonia    Neuromuscular disorder (HCC)    neuropathy left arm and foot   Osteopenia    Paralysis (HCC)    partial left side from CVA    Persistent atrial fibrillation (HCC)    PONV (postoperative nausea and vomiting)    Pre-diabetes    Stroke [facility]) 2013   hemmorahgic   Past Surgical History:  Procedure Laterality Date   ANKLE SURGERY     APPENDECTOMY     CHOLECYSTECTOMY     HARDWARE REMOVAL Left 03/29/2023   Procedure: HARDWARE REMOVAL;  Surgeon: Toni Arthurs, MD;  Location: [facility];  Service: Orthopedics;  Laterality: Left;   HERNIA REPAIR     Esophagus   INCISION AND DRAINAGE OF WOUND Left 03/29/2023   Procedure: LEFT ANKLE WOUND IRRIGATION AND DEBRIDEMENT WOUND;  Surgeon: Toni Arthurs, MD;  Location: [facility];  Service: Orthopedics;  Laterality: Left;   JOINT REPLACEMENT     total- right partial- left   MASTECTOMY PARTIAL / LUMPECTOMY  2012   left   ORIF ANKLE FRACTURE Left 07/20/2018   Procedure: OPEN REDUCTION INTERNAL FIXATION (ORIF) ANKLE FRACTURE;  Surgeon: Toni Arthurs, MD;  Location: [facility];  Service: Orthopedics;  Laterality: Left;   TOTAL KNEE  ARTHROPLASTY Left 01/27/2019   Procedure: TOTAL KNEE ARTHROPLASTY;  Surgeon: Dannielle Huh, MD;  Location: [facility];  Service: Orthopedics;  Laterality: Left;   Patient Active Problem List   Diagnosis Date Noted   Goals of care, counseling/discussion 01/17/2022   Grief 01/17/2022   Secondary hypercoagulable state (HCC) 03/16/2021   Dizziness 03/10/2021   Orthostatic hypotension 10/15/2020   Presbycusis of both ears 03/08/2020   Mixed stress and urge urinary incontinence 12/02/2019   Macrocytosis 12/01/2019   Nutritional anemia 12/01/2019   S/P total knee replacement 01/27/2019   Recurrent left knee instability 07/05/2018   Respiratory failure with hypoxia (HCC) 08/30/2017   Hypoxemia    Heart failure with preserved ejection fraction (HCC), Grade 3 diastolic dysfunction 03/26/2017   PAF (paroxysmal atrial fibrillation) (HCC)    Dyspnea 03/19/2017   Encounter for preventive health examination 02/17/2016   Sensorineural hearing loss (SNHL), bilateral 01/26/2016   Hypomagnesemia 04/24/2014   Hemiparesis affecting left side as late effect of cerebrovascular accident (HCC) 04/24/2014   Nontraumatic cerebral hemorrhage (HCC) 04/30/2012   DM (diabetes mellitus) with complications (HCC) 03/04/2010   OSTEOPENIA 01/21/2009   UNSPECIFIED VITAMIN D DEFICIENCY 11/19/2007   HYPERCHOLESTEROLEMIA 10/25/2006   GASTROESOPHAGEAL REFLUX, NO ESOPHAGITIS 10/25/2006   DIVERTICULOSIS OF COLON  10/25/2006   Osteoarthritis 10/25/2006   CERVICAL SPINE DISORDER, NOS 10/25/2006    PCP: Waynard Edwards, MD  REFERRING PROVIDER: Waynard Edwards, MD  REFERRING DIAG: s/p left ankle ORIF hardware removal  THERAPY DIAG:  Muscle weakness (generalized)  Difficulty in walking, not elsewhere classified  Pain in left ankle and joints of left foot  Ataxia  Hemiplegia and hemiparesis following cerebral infarction affecting left non-dominant side (HCC)  Late effects of CVA (cerebrovascular accident)  Cervicalgia  Left shoulder  pain, unspecified chronicity  Rationale for Evaluation and Treatment: Rehabilitation  ONSET DATE: 03/29/23  SUBJECTIVE:   SUBJECTIVE STATEMENT: Patient reports that she is hurting in the shoulders and a little in the left ankle  PERTINENT HISTORY: CVA, CHF, HTN, A-fib, TKA PAIN:  Are you having pain? Yes: NPRS scale: 6/10 Pain location: left upper trap and neck, left ankle and knee  Pain description: ache sore Aggravating factors: neck and shoulder always hurt, knee and ankle hurt with transfers Relieving factors: massage, heat  PRECAUTIONS: None  RED FLAGS: None   WEIGHT BEARING RESTRICTIONS: No  FALLS:  Has patient fallen in last 6 months? No  LIVING ENVIRONMENT: Lives with: lives alone Lives in: House/apartment Stairs: No Has following equipment at home: Environmental consultant - 2 wheeled, Wheelchair (manual), shower chair, bed side commode, Grab bars, and Ramped entry  OCCUPATION: retired  PLOF: Needs assistance with homemaking, Needs assistance with transfers, and Leisure: plays bridge, she has 11 hours of an aide at home that helps with meals, going to MD's, dressing and bathing, is alone at night  PATIENT GOALS: walk, have less pain, transfer without difficulty  NEXT MD VISIT: none scheduled  OBJECTIVE:   DIAGNOSTIC FINDINGS: none performed  COGNITION: Overall cognitive status: Within functional limits for tasks assessed     SENSATION: WFL POSTURE: rounded shoulders and forward head  PALPATION: Left knee and ankle are swollen and tender, left upper and neck  LOWER EXTREMITY ROM:  Active ROM Right eval Left eval  Left 07/31/23  Hip flexion      Hip extension      Hip abduction      Hip adduction      Hip internal rotation      Hip external rotation      Knee flexion  90 93 36  Knee extension  0 0 0  Ankle dorsiflexion  5    Ankle plantarflexion  30 44 49  Ankle inversion  5    Ankle eversion  0     (Blank rows = not tested)  LOWER EXTREMITY MMT:  MMT  Right eval Left eval Left 06/21/23 Left 07/31/23  Hip flexion 4- 3+ 4- 4  Hip extension      Hip abduction 4 3+ 4- 4+  Hip adduction 4 3+ 4- 4  Hip internal rotation      Hip external rotation      Knee flexion 4- 3+ 4 4  Knee extension 4- 3+ 4- 4  Ankle dorsiflexion      Ankle plantarflexion      Ankle inversion      Ankle eversion       (Blank rows = not tested)  FUNCTIONAL TESTS:  Transfers Max A, left knee gives out and goes into ER and tends to bow into varus, has not walked in about 18 months, and the last time I saw her we could walk about 60 feet with HHA and w/c following  GAIT: Distance walked: unable   TODAY'S TREATMENT:  DATE:  08/09/23 Nustep level 5 x 6 minutes Gait with HHA and w/c follow 46', 54' and 46', stops due to fatigue Red tband ankle PF/DF Red tband HS curls Red tband hip adduction  Red tband hip abduction 5# marches 5# LAQ STM to the left upper trap and neck  08/06/23 I took off brace and did a refitting, she had a new caregiver today Standing weight shifts Nustep x 8 minutes 550 steps STM to the left upper trap and neck area 5# marches 5# LAQ Green tband hip adduction and abduction left only to get good contraction Green tband HS curl Gait with HHA 2x 55 feet with w/c follow  08/02/23 Nustep level 5 x 7 minutes 500 steps STM to UT and cervical spine  MHP UT cervical spine   Green tband Hip adduction left Green tband hip abduction left  Green tband HS curls  5# LAQ 5# marches Green tband HS curls Gait with HHA and W/C follow 70 feet x , then 5ft   07/31/23 Nustep level 5 x 7 minutes 500 steps MHP UT cervical spine   Green tband Hip adduction left Green tband hip abduction left  Green tband HS curls  5# LAQ 5# marches Gait with HHA and W/C follow 70 feet x 1 again no L knee brace  07/24/23 Nustep level 5  x 7 minutes 500 steps MHP UT cervical spine   Green tband Hip adduction left Green tband hip abduction left  Green tband HS curls  5# LAQ 5# marches Green tband ankle PF/DF Gait with HHA and W/C follow 50 feet x 1 today due to some knee soreness forgot brace  07/19/23 5# LAQ 5# marches STM to the upper traps, rhomboids and the neck  Nustep level 5 x 8 minutes c/o some knee pain today Green tband Hip adduction left Green tband hip abduction left Green tband HS curls Green tband ankle PF/DF Gait with HHA and W/C follow 50 feet x 1 today due to some knee and ankle soreness  07/17/23 Nustep level 5 x 7 minutes 500 steps 5# LAQ 5# marches Green tband PF/DF Green tband HS curls Green tband hip abduction Green tband hip adduction STM to the left upper trap and neck Gait with HHA and close W/C follow 45' x 2  07/12/23 Nustep level 5 x 7 minutes 500 steps Put heat on the shoulders while she was sitting and doing the LE ex's 5# LAQ bilateral 5# marches bilateral Left leg green tband HS curls Left red tband hip add and abduction all 3x10 each leg STM to the upper traps and into the neck Gait with HHA and w/c follow 45' x 2  PATIENT EDUCATION:  Education details: POC Person educated: Patient Education method: Explanation Education comprehension: verbalized understanding  HOME EXERCISE PROGRAM: TBD  ASSESSMENT:  CLINICAL IMPRESSION: Pt had the knee brace on, ankle brace was fitting better, I continue to try to push the distance walked, no pain today mostly fatigue.  The knee brace does help with the walking and the exercises, she still had a little bit of ankle roll today  OBJECTIVE IMPAIRMENTS: Abnormal gait, cardiopulmonary status limiting activity, decreased activity tolerance, decreased balance, decreased coordination, decreased endurance, decreased mobility, difficulty walking, decreased ROM, decreased strength, increased edema, impaired flexibility, improper body  mechanics, postural dysfunction, and pain.   REHAB POTENTIAL: Good  CLINICAL DECISION MAKING: Stable/uncomplicated  EVALUATION COMPLEXITY: Low   GOALS: Goals reviewed with patient? Yes  SHORT TERM GOALS: Target date:  06/02/23 Independent with advanced HEP Goal status: met 06/07/23  LONG TERM GOALS: Target date: 08/21/23  Independent with advanced HEP with caregiver Goal status: ongoing 07/17/23  2.  Transfer with set up and CGA Goal status: Ongoing 07/30/23  3.  Walk HHA x 100 feet Goal status: Progressing 08/09/23  4.  Increase left LE strength to 4/5 Goal status: Met 07/31/23  5.  Report neck and shoulder pain decreased 50% Goal status: Progressing 30% 08/09/23  PLAN:  PT FREQUENCY: 1-2x/week  PT DURATION: 12 weeks  PLANNED INTERVENTIONS: Therapeutic exercises, Therapeutic activity, Neuromuscular re-education, Balance training, Gait training, Patient/Family education, Self Care, Joint mobilization, Moist heat, Taping, and Manual therapy  PLAN FOR NEXT SESSION: continue to work on her safe transfers and walking.   Jearld Lesch, PT

## 2023-08-14 ENCOUNTER — Ambulatory Visit: Payer: Medicare Other | Admitting: Physical Therapy

## 2023-08-14 ENCOUNTER — Encounter: Payer: Self-pay | Admitting: Physical Therapy

## 2023-08-14 DIAGNOSIS — R270 Ataxia, unspecified: Secondary | ICD-10-CM | POA: Diagnosis not present

## 2023-08-14 DIAGNOSIS — R262 Difficulty in walking, not elsewhere classified: Secondary | ICD-10-CM

## 2023-08-14 DIAGNOSIS — I69354 Hemiplegia and hemiparesis following cerebral infarction affecting left non-dominant side: Secondary | ICD-10-CM | POA: Diagnosis not present

## 2023-08-14 DIAGNOSIS — M6281 Muscle weakness (generalized): Secondary | ICD-10-CM

## 2023-08-14 DIAGNOSIS — M25512 Pain in left shoulder: Secondary | ICD-10-CM

## 2023-08-14 DIAGNOSIS — M25572 Pain in left ankle and joints of left foot: Secondary | ICD-10-CM

## 2023-08-14 NOTE — Therapy (Signed)
OUTPATIENT PHYSICAL THERAPY LOWER EXTREMITY TREATMENT   Patient Name: Karen Jackson MRN: [id number] DOB:[birth year], 82 y.o., female Today's Date: 08/14/2023  END OF SESSION:  PT End of Session - 08/14/23 1103     Visit Number 24    Date for PT Re-Evaluation 08/21/23    PT Start Time 1100    PT Stop Time 1145    PT Time Calculation (min) 45 min    Activity Tolerance Patient tolerated treatment well    Behavior During Therapy [facility] for tasks assessed/performed             Past Medical History:  Diagnosis Date   Acute cystitis without hematuria    Acute diastolic CHF (congestive heart failure) (HCC)    Arthritis    Dyspnea    Dysrhythmia    Fever of unknown origin 03/19/2017   Hyperlipidemia    Hypertension    denies at preop   Multifocal pneumonia    Neuromuscular disorder (HCC)    neuropathy left arm and foot   Osteopenia    Paralysis (HCC)    partial left side from CVA    Persistent atrial fibrillation (HCC)    PONV (postoperative nausea and vomiting)    Pre-diabetes    Stroke [facility]) 2013   hemmorahgic   Past Surgical History:  Procedure Laterality Date   ANKLE SURGERY     APPENDECTOMY     CHOLECYSTECTOMY     HARDWARE REMOVAL Left 03/29/2023   Procedure: HARDWARE REMOVAL;  Surgeon: Toni Arthurs, MD;  Location: [facility];  Service: Orthopedics;  Laterality: Left;   HERNIA REPAIR     Esophagus   INCISION AND DRAINAGE OF WOUND Left 03/29/2023   Procedure: LEFT ANKLE WOUND IRRIGATION AND DEBRIDEMENT WOUND;  Surgeon: Toni Arthurs, MD;  Location: [facility];  Service: Orthopedics;  Laterality: Left;   JOINT REPLACEMENT     total- right partial- left   MASTECTOMY PARTIAL / LUMPECTOMY  2012   left   ORIF ANKLE FRACTURE Left 07/20/2018   Procedure: OPEN REDUCTION INTERNAL FIXATION (ORIF) ANKLE FRACTURE;  Surgeon: Toni Arthurs, MD;  Location: [facility];  Service: Orthopedics;  Laterality: Left;   TOTAL KNEE ARTHROPLASTY Left 01/27/2019    Procedure: TOTAL KNEE ARTHROPLASTY;  Surgeon: Dannielle Huh, MD;  Location: [facility];  Service: Orthopedics;  Laterality: Left;   Patient Active Problem List   Diagnosis Date Noted   Goals of care, counseling/discussion 01/17/2022   Grief 01/17/2022   Secondary hypercoagulable state (HCC) 03/16/2021   Dizziness 03/10/2021   Orthostatic hypotension 10/15/2020   Presbycusis of both ears 03/08/2020   Mixed stress and urge urinary incontinence 12/02/2019   Macrocytosis 12/01/2019   Nutritional anemia 12/01/2019   S/P total knee replacement 01/27/2019   Recurrent left knee instability 07/05/2018   Respiratory failure with hypoxia (HCC) 08/30/2017   Hypoxemia    Heart failure with preserved ejection fraction (HCC), Grade 3 diastolic dysfunction 03/26/2017   PAF (paroxysmal atrial fibrillation) (HCC)    Dyspnea 03/19/2017   Encounter for preventive health examination 02/17/2016   Sensorineural hearing loss (SNHL), bilateral 01/26/2016   Hypomagnesemia 04/24/2014   Hemiparesis affecting left side as late effect of cerebrovascular accident (HCC) 04/24/2014   Nontraumatic cerebral hemorrhage (HCC) 04/30/2012   DM (diabetes mellitus) with complications (HCC) 03/04/2010   OSTEOPENIA 01/21/2009   UNSPECIFIED VITAMIN D DEFICIENCY 11/19/2007   HYPERCHOLESTEROLEMIA 10/25/2006   GASTROESOPHAGEAL REFLUX, NO ESOPHAGITIS 10/25/2006   DIVERTICULOSIS OF COLON 10/25/2006   Osteoarthritis 10/25/2006  CERVICAL SPINE DISORDER, NOS 10/25/2006    PCP: Waynard Edwards, MD  REFERRING PROVIDER: Waynard Edwards, MD  REFERRING DIAG: s/p left ankle ORIF hardware removal  THERAPY DIAG:  Muscle weakness (generalized)  Difficulty in walking, not elsewhere classified  Pain in left ankle and joints of left foot  Left shoulder pain, unspecified chronicity  Rationale for Evaluation and Treatment: Rehabilitation  ONSET DATE: 03/29/23  SUBJECTIVE:   SUBJECTIVE STATEMENT: Pt reports some fatigue form not sleeping well last  night. Knee and ankle is stiff  PERTINENT HISTORY: CVA, CHF, HTN, A-fib, TKA PAIN:  Are you having pain? Yes: NPRS scale: 6/10 Pain location: left upper trap and neck, left ankle and knee  Pain description: ache sore Aggravating factors: neck and shoulder always hurt, knee and ankle hurt with transfers Relieving factors: massage, heat  PRECAUTIONS: None  RED FLAGS: None   WEIGHT BEARING RESTRICTIONS: No  FALLS:  Has patient fallen in last 6 months? No  LIVING ENVIRONMENT: Lives with: lives alone Lives in: House/apartment Stairs: No Has following equipment at home: Environmental consultant - 2 wheeled, Wheelchair (manual), shower chair, bed side commode, Grab bars, and Ramped entry  OCCUPATION: retired  PLOF: Needs assistance with homemaking, Needs assistance with transfers, and Leisure: plays bridge, she has 11 hours of an aide at home that helps with meals, going to MD's, dressing and bathing, is alone at night  PATIENT GOALS: walk, have less pain, transfer without difficulty  NEXT MD VISIT: none scheduled  OBJECTIVE:   DIAGNOSTIC FINDINGS: none performed  COGNITION: Overall cognitive status: Within functional limits for tasks assessed     SENSATION: WFL POSTURE: rounded shoulders and forward head  PALPATION: Left knee and ankle are swollen and tender, left upper and neck  LOWER EXTREMITY ROM:  Active ROM Right eval Left eval  Left 07/31/23  Hip flexion      Hip extension      Hip abduction      Hip adduction      Hip internal rotation      Hip external rotation      Knee flexion  90 93 36  Knee extension  0 0 0  Ankle dorsiflexion  5    Ankle plantarflexion  30 44 49  Ankle inversion  5    Ankle eversion  0     (Blank rows = not tested)  LOWER EXTREMITY MMT:  MMT Right eval Left eval Left 06/21/23 Left 07/31/23  Hip flexion 4- 3+ 4- 4  Hip extension      Hip abduction 4 3+ 4- 4+  Hip adduction 4 3+ 4- 4  Hip internal rotation      Hip external rotation       Knee flexion 4- 3+ 4 4  Knee extension 4- 3+ 4- 4  Ankle dorsiflexion      Ankle plantarflexion      Ankle inversion      Ankle eversion       (Blank rows = not tested)  FUNCTIONAL TESTS:  Transfers Max A, left knee gives out and goes into ER and tends to bow into varus, has not walked in about 18 months, and the last time I saw her we could walk about 60 feet with HHA and w/c following  GAIT: Distance walked: unable   TODAY'S TREATMENT:  DATE:  08/14/23 Approximation to L hand digits STM to the left upper trap, L deltoid, and neck NuStep L 5 x 7 min 500 steps  Red tband ankle PF/DF Red tband HS curls Red tband hip adduction  Red tband hip abduction 5# marches 5# LAQ Gait with HHA and w/c follow 2' and 46', stops due to fatigue 08/09/23 Nustep level 5 x 6 minutes Gait with HHA and w/c follow 46', 54' and 46', stops due to fatigue Red tband ankle PF/DF Red tband HS curls Red tband hip adduction  Red tband hip abduction 5# marches 5# LAQ STM to the left upper trap and neck  08/06/23 I took off brace and did a refitting, she had a new caregiver today Standing weight shifts Nustep x 8 minutes 550 steps STM to the left upper trap and neck area 5# marches 5# LAQ Green tband hip adduction and abduction left only to get good contraction Green tband HS curl Gait with HHA 2x 55 feet with w/c follow  08/02/23 Nustep level 5 x 7 minutes 500 steps STM to UT and cervical spine  MHP UT cervical spine   Green tband Hip adduction left Green tband hip abduction left  Green tband HS curls  5# LAQ 5# marches Green tband HS curls Gait with HHA and W/C follow 70 feet x , then 2ft   07/31/23 Nustep level 5 x 7 minutes 500 steps MHP UT cervical spine   Green tband Hip adduction left Green tband hip abduction left  Green tband HS curls  5# LAQ 5#  marches Gait with HHA and W/C follow 70 feet x 1 again no L knee brace  07/24/23 Nustep level 5 x 7 minutes 500 steps MHP UT cervical spine   Green tband Hip adduction left Green tband hip abduction left  Green tband HS curls  5# LAQ 5# marches Green tband ankle PF/DF Gait with HHA and W/C follow 50 feet x 1 today due to some knee soreness forgot brace  07/19/23 5# LAQ 5# marches STM to the upper traps, rhomboids and the neck  Nustep level 5 x 8 minutes c/o some knee pain today Green tband Hip adduction left Green tband hip abduction left Green tband HS curls Green tband ankle PF/DF Gait with HHA and W/C follow 50 feet x 1 today due to some knee and ankle soreness  07/17/23 Nustep level 5 x 7 minutes 500 steps 5# LAQ 5# marches Green tband PF/DF Green tband HS curls Green tband hip abduction Green tband hip adduction STM to the left upper trap and neck Gait with HHA and close W/C follow 45' x 2   PATIENT EDUCATION:  Education details: POC Person educated: Patient Education method: Explanation Education comprehension: verbalized understanding  HOME EXERCISE PROGRAM: TBD  ASSESSMENT:  CLINICAL IMPRESSION: Pt had the knee brace on, nut was reporting some L ankle rolling despite having knee brace., I continue to try to push the distance walked, no pain today mostly fatigue.  The knee brace does help with the walking and the exercises. Cues for eccentric control needed with LAQ and HS curls.  OBJECTIVE IMPAIRMENTS: Abnormal gait, cardiopulmonary status limiting activity, decreased activity tolerance, decreased balance, decreased coordination, decreased endurance, decreased mobility, difficulty walking, decreased ROM, decreased strength, increased edema, impaired flexibility, improper body mechanics, postural dysfunction, and pain.   REHAB POTENTIAL: Good  CLINICAL DECISION MAKING: Stable/uncomplicated  EVALUATION COMPLEXITY: Low   GOALS: Goals reviewed with  patient? Yes  SHORT TERM GOALS: Target  date: 06/02/23 Independent with advanced HEP Goal status: met 06/07/23  LONG TERM GOALS: Target date: 08/21/23  Independent with advanced HEP with caregiver Goal status: ongoing 07/17/23  2.  Transfer with set up and CGA Goal status: Ongoing 07/30/23  3.  Walk HHA x 100 feet Goal status: Progressing 08/09/23  4.  Increase left LE strength to 4/5 Goal status: Met 07/31/23  5.  Report neck and shoulder pain decreased 50% Goal status: Progressing 30% 08/09/23  PLAN:  PT FREQUENCY: 1-2x/week  PT DURATION: 12 weeks  PLANNED INTERVENTIONS: Therapeutic exercises, Therapeutic activity, Neuromuscular re-education, Balance training, Gait training, Patient/Family education, Self Care, Joint mobilization, Moist heat, Taping, and Manual therapy  PLAN FOR NEXT SESSION: continue to work on her safe transfers and walking.   Grayce Sessions, PTA

## 2023-08-16 ENCOUNTER — Ambulatory Visit: Payer: Medicare Other | Admitting: Physical Therapy

## 2023-08-16 ENCOUNTER — Encounter: Payer: Self-pay | Admitting: Physical Therapy

## 2023-08-16 DIAGNOSIS — R270 Ataxia, unspecified: Secondary | ICD-10-CM | POA: Diagnosis not present

## 2023-08-16 DIAGNOSIS — I69354 Hemiplegia and hemiparesis following cerebral infarction affecting left non-dominant side: Secondary | ICD-10-CM | POA: Diagnosis not present

## 2023-08-16 DIAGNOSIS — R262 Difficulty in walking, not elsewhere classified: Secondary | ICD-10-CM

## 2023-08-16 DIAGNOSIS — M25572 Pain in left ankle and joints of left foot: Secondary | ICD-10-CM

## 2023-08-16 DIAGNOSIS — M6281 Muscle weakness (generalized): Secondary | ICD-10-CM | POA: Diagnosis not present

## 2023-08-16 DIAGNOSIS — M25512 Pain in left shoulder: Secondary | ICD-10-CM

## 2023-08-16 NOTE — Therapy (Signed)
OUTPATIENT PHYSICAL THERAPY LOWER EXTREMITY TREATMENT   Patient Name: Karen Jackson MRN: [id number] DOB:Jul 14, 1941, 82 y.o., female Today's Date: 08/16/2023  END OF SESSION:  PT End of Session - 08/16/23 1111     Visit Number 25    Date for PT Re-Evaluation 08/21/23    PT Start Time 1100    PT Stop Time 1145    PT Time Calculation (min) 45 min    Activity Tolerance Patient tolerated treatment well    Behavior During Therapy [facility] for tasks assessed/performed             Past Medical History:  Diagnosis Date   Acute cystitis without hematuria    Acute diastolic CHF (congestive heart failure) (HCC)    Arthritis    Dyspnea    Dysrhythmia    Fever of unknown origin 03/19/2017   Hyperlipidemia    Hypertension    denies at preop   Multifocal pneumonia    Neuromuscular disorder (HCC)    neuropathy left arm and foot   Osteopenia    Paralysis (HCC)    partial left side from CVA    Persistent atrial fibrillation (HCC)    PONV (postoperative nausea and vomiting)    Pre-diabetes    Stroke [facility]) 2013   hemmorahgic   Past Surgical History:  Procedure Laterality Date   ANKLE SURGERY     APPENDECTOMY     CHOLECYSTECTOMY     HARDWARE REMOVAL Left 03/29/2023   Procedure: HARDWARE REMOVAL;  Surgeon: Toni Arthurs, MD;  Location: [facility];  Service: Orthopedics;  Laterality: Left;   HERNIA REPAIR     Esophagus   INCISION AND DRAINAGE OF WOUND Left 03/29/2023   Procedure: LEFT ANKLE WOUND IRRIGATION AND DEBRIDEMENT WOUND;  Surgeon: Toni Arthurs, MD;  Location: [facility];  Service: Orthopedics;  Laterality: Left;   JOINT REPLACEMENT     total- right partial- left   MASTECTOMY PARTIAL / LUMPECTOMY  2012   left   ORIF ANKLE FRACTURE Left 07/20/2018   Procedure: OPEN REDUCTION INTERNAL FIXATION (ORIF) ANKLE FRACTURE;  Surgeon: Toni Arthurs, MD;  Location: [facility];  Service: Orthopedics;  Laterality: Left;   TOTAL KNEE ARTHROPLASTY Left 01/27/2019    Procedure: TOTAL KNEE ARTHROPLASTY;  Surgeon: Dannielle Huh, MD;  Location: [facility];  Service: Orthopedics;  Laterality: Left;   Patient Active Problem List   Diagnosis Date Noted   Goals of care, counseling/discussion 01/17/2022   Grief 01/17/2022   Secondary hypercoagulable state (HCC) 03/16/2021   Dizziness 03/10/2021   Orthostatic hypotension 10/15/2020   Presbycusis of both ears 03/08/2020   Mixed stress and urge urinary incontinence 12/02/2019   Macrocytosis 12/01/2019   Nutritional anemia 12/01/2019   S/P total knee replacement 01/27/2019   Recurrent left knee instability 07/05/2018   Respiratory failure with hypoxia (HCC) 08/30/2017   Hypoxemia    Heart failure with preserved ejection fraction (HCC), Grade 3 diastolic dysfunction 03/26/2017   PAF (paroxysmal atrial fibrillation) (HCC)    Dyspnea 03/19/2017   Encounter for preventive health examination 02/17/2016   Sensorineural hearing loss (SNHL), bilateral 01/26/2016   Hypomagnesemia 04/24/2014   Hemiparesis affecting left side as late effect of cerebrovascular accident (HCC) 04/24/2014   Nontraumatic cerebral hemorrhage (HCC) 04/30/2012   DM (diabetes mellitus) with complications (HCC) 03/04/2010   OSTEOPENIA 01/21/2009   UNSPECIFIED VITAMIN D DEFICIENCY 11/19/2007   HYPERCHOLESTEROLEMIA 10/25/2006   GASTROESOPHAGEAL REFLUX, NO ESOPHAGITIS 10/25/2006   DIVERTICULOSIS OF COLON 10/25/2006   Osteoarthritis 10/25/2006  CERVICAL SPINE DISORDER, NOS 10/25/2006    PCP: Waynard Edwards, MD  REFERRING PROVIDER: Waynard Edwards, MD  REFERRING DIAG: s/p left ankle ORIF hardware removal  THERAPY DIAG:  Muscle weakness (generalized)  Difficulty in walking, not elsewhere classified  Left shoulder pain, unspecified chronicity  Pain in left ankle and joints of left foot  Rationale for Evaluation and Treatment: Rehabilitation  ONSET DATE: 03/29/23  SUBJECTIVE:   SUBJECTIVE STATEMENT: Doing ok  PERTINENT HISTORY: CVA, CHF, HTN, A-fib,  TKA PAIN:  Are you having pain? Yes: NPRS scale: 4/10 Pain location: left upper trap and neck, left ankle and knee  Pain description: ache sore Aggravating factors: neck and shoulder always hurt, knee and ankle hurt with transfers Relieving factors: massage, heat  PRECAUTIONS: None  RED FLAGS: None   WEIGHT BEARING RESTRICTIONS: No  FALLS:  Has patient fallen in last 6 months? No  LIVING ENVIRONMENT: Lives with: lives alone Lives in: House/apartment Stairs: No Has following equipment at home: Environmental consultant - 2 wheeled, Wheelchair (manual), shower chair, bed side commode, Grab bars, and Ramped entry  OCCUPATION: retired  PLOF: Needs assistance with homemaking, Needs assistance with transfers, and Leisure: plays bridge, she has 11 hours of an aide at home that helps with meals, going to MD's, dressing and bathing, is alone at night  PATIENT GOALS: walk, have less pain, transfer without difficulty  NEXT MD VISIT: none scheduled  OBJECTIVE:   DIAGNOSTIC FINDINGS: none performed  COGNITION: Overall cognitive status: Within functional limits for tasks assessed     SENSATION: WFL POSTURE: rounded shoulders and forward head  PALPATION: Left knee and ankle are swollen and tender, left upper and neck  LOWER EXTREMITY ROM:  Active ROM Right eval Left eval  Left 07/31/23 Left 08/16/23  Hip flexion       Hip extension       Hip abduction       Hip adduction       Hip internal rotation       Hip external rotation       Knee flexion  90 93 96 95  Knee extension  0 0 0 0  Ankle dorsiflexion  5     Ankle plantarflexion  30 44 49 52  Ankle inversion  5     Ankle eversion  0      (Blank rows = not tested)  LOWER EXTREMITY MMT:  MMT Right eval Left eval Left 06/21/23 Left 07/31/23  Hip flexion 4- 3+ 4- 4  Hip extension      Hip abduction 4 3+ 4- 4+  Hip adduction 4 3+ 4- 4  Hip internal rotation      Hip external rotation      Knee flexion 4- 3+ 4 4  Knee extension 4-  3+ 4- 4  Ankle dorsiflexion      Ankle plantarflexion      Ankle inversion      Ankle eversion       (Blank rows = not tested)  FUNCTIONAL TESTS:  Transfers Max A, left knee gives out and goes into ER and tends to bow into varus, has not walked in about 18 months, and the last time I saw her we could walk about 60 feet with HHA and w/c following  GAIT: Distance walked: unable   TODAY'S TREATMENT:  DATE:  08/16/23 Approximation to L hand digits STM to the left upper trap, L deltoid, and neck NuStep L 5 x 7 min 500 steps  Red tband ankle PF/DF Red tband HS curls green tband hip adduction  green tband hip abduction 5# marches 5# LAQ Gait with HHA and w/c follow 39' and 46', stops due to fatigue  08/14/23 Approximation to L hand digits STM to the left upper trap, L deltoid, and neck NuStep L 5 x 7 min 500 steps  Red tband ankle PF/DF Red tband HS curls Red tband hip adduction  Red tband hip abduction 5# marches 5# LAQ Gait with HHA and w/c follow 93' and 46', stops due to fatigue 08/09/23 Nustep level 5 x 6 minutes Gait with HHA and w/c follow 46', 54' and 46', stops due to fatigue Red tband ankle PF/DF Red tband HS curls Red tband hip adduction  Red tband hip abduction 5# marches 5# LAQ STM to the left upper trap and neck  08/06/23 I took off brace and did a refitting, she had a new caregiver today Standing weight shifts Nustep x 8 minutes 550 steps STM to the left upper trap and neck area 5# marches 5# LAQ Green tband hip adduction and abduction left only to get good contraction Green tband HS curl Gait with HHA 2x 55 feet with w/c follow  08/02/23 Nustep level 5 x 7 minutes 500 steps STM to UT and cervical spine  MHP UT cervical spine   Green tband Hip adduction left Green tband hip abduction left  Green tband HS curls  5# LAQ 5#  marches Green tband HS curls Gait with HHA and W/C follow 70 feet x , then 31ft   07/31/23 Nustep level 5 x 7 minutes 500 steps MHP UT cervical spine   Green tband Hip adduction left Green tband hip abduction left  Green tband HS curls  5# LAQ 5# marches Gait with HHA and W/C follow 70 feet x 1 again no L knee brace  07/24/23 Nustep level 5 x 7 minutes 500 steps MHP UT cervical spine   Green tband Hip adduction left Green tband hip abduction left  Green tband HS curls  5# LAQ 5# marches Green tband ankle PF/DF Gait with HHA and W/C follow 50 feet x 1 today due to some knee soreness forgot brace  07/19/23 5# LAQ 5# marches STM to the upper traps, rhomboids and the neck  Nustep level 5 x 8 minutes c/o some knee pain today Green tband Hip adduction left Green tband hip abduction left Green tband HS curls Green tband ankle PF/DF Gait with HHA and W/C follow 50 feet x 1 today due to some knee and ankle soreness  07/17/23 Nustep level 5 x 7 minutes 500 steps 5# LAQ 5# marches Green tband PF/DF Green tband HS curls Green tband hip abduction Green tband hip adduction STM to the left upper trap and neck Gait with HHA and close W/C follow 45' x 2   PATIENT EDUCATION:  Education details: POC Person educated: Patient Education method: Explanation Education comprehension: verbalized understanding  HOME EXERCISE PROGRAM: TBD  ASSESSMENT:  CLINICAL IMPRESSION: Pt has progressed increasing her L ankle plantarflexion AROM.  Did not push gait too much today because pt reports having a busy afternoon transferring. Cues needed with transfers not to sit to early being unsafe.continue to try to push the distance walked, no pain today mostly fatigue.  Cues for eccentric control needed with LAQ and HS  curls. Increase resistance tolerated with hip abd/add  OBJECTIVE IMPAIRMENTS: Abnormal gait, cardiopulmonary status limiting activity, decreased activity tolerance, decreased  balance, decreased coordination, decreased endurance, decreased mobility, difficulty walking, decreased ROM, decreased strength, increased edema, impaired flexibility, improper body mechanics, postural dysfunction, and pain.   REHAB POTENTIAL: Good  CLINICAL DECISION MAKING: Stable/uncomplicated  EVALUATION COMPLEXITY: Low   GOALS: Goals reviewed with patient? Yes  SHORT TERM GOALS: Target date: 06/02/23 Independent with advanced HEP Goal status: met 06/07/23  LONG TERM GOALS: Target date: 08/21/23  Independent with advanced HEP with caregiver Goal status: ongoing 07/17/23  2.  Transfer with set up and CGA Goal status: Ongoing 08/16/23  3.  Walk HHA x 100 feet Goal status: Progressing 08/09/23  4.  Increase left LE strength to 4/5 Goal status: Met 07/31/23  5.  Report neck and shoulder pain decreased 50% Goal status: Progressing 30% 08/09/23  PLAN:  PT FREQUENCY: 1-2x/week  PT DURATION: 12 weeks  PLANNED INTERVENTIONS: Therapeutic exercises, Therapeutic activity, Neuromuscular re-education, Balance training, Gait training, Patient/Family education, Self Care, Joint mobilization, Moist heat, Taping, and Manual therapy  PLAN FOR NEXT SESSION: continue to work on her safe transfers and walking.   Grayce Sessions, PTA

## 2023-08-21 ENCOUNTER — Other Ambulatory Visit: Payer: Self-pay | Admitting: Cardiology

## 2023-08-21 ENCOUNTER — Encounter: Payer: Self-pay | Admitting: Physical Therapy

## 2023-08-21 ENCOUNTER — Ambulatory Visit: Payer: Medicare Other | Admitting: Physical Therapy

## 2023-08-21 DIAGNOSIS — I69354 Hemiplegia and hemiparesis following cerebral infarction affecting left non-dominant side: Secondary | ICD-10-CM | POA: Diagnosis not present

## 2023-08-21 DIAGNOSIS — R270 Ataxia, unspecified: Secondary | ICD-10-CM

## 2023-08-21 DIAGNOSIS — M542 Cervicalgia: Secondary | ICD-10-CM

## 2023-08-21 DIAGNOSIS — M25512 Pain in left shoulder: Secondary | ICD-10-CM | POA: Diagnosis not present

## 2023-08-21 DIAGNOSIS — M25572 Pain in left ankle and joints of left foot: Secondary | ICD-10-CM | POA: Diagnosis not present

## 2023-08-21 DIAGNOSIS — R262 Difficulty in walking, not elsewhere classified: Secondary | ICD-10-CM | POA: Diagnosis not present

## 2023-08-21 DIAGNOSIS — M6281 Muscle weakness (generalized): Secondary | ICD-10-CM

## 2023-08-21 DIAGNOSIS — R293 Abnormal posture: Secondary | ICD-10-CM

## 2023-08-21 DIAGNOSIS — E7800 Pure hypercholesterolemia, unspecified: Secondary | ICD-10-CM

## 2023-08-21 DIAGNOSIS — I6990 Unspecified sequelae of unspecified cerebrovascular disease: Secondary | ICD-10-CM

## 2023-08-21 NOTE — Therapy (Signed)
OUTPATIENT PHYSICAL THERAPY LOWER EXTREMITY TREATMENT   Patient Name: Karen Jackson MRN: [id number] DOB:05/13/1941, 82 y.o., female Today's Date: 08/21/2023  END OF SESSION:  PT End of Session - 08/21/23 1059     Visit Number 26    Date for PT Re-Evaluation 08/21/23    Authorization Type Medicare    PT Start Time 1015    PT Stop Time 1100    PT Time Calculation (min) 45 min    Activity Tolerance Patient tolerated treatment well    Behavior During Therapy WFL for tasks assessed/performed             Past Medical History:  Diagnosis Date   Acute cystitis without hematuria    Acute diastolic CHF (congestive heart failure) (HCC)    Arthritis    Dyspnea    Dysrhythmia    Fever of unknown origin 03/19/2017   Hyperlipidemia    Hypertension    denies at preop   Multifocal pneumonia    Neuromuscular disorder (HCC)    neuropathy left arm and foot   Osteopenia    Paralysis (HCC)    partial left side from CVA    Persistent atrial fibrillation (HCC)    PONV (postoperative nausea and vomiting)    Pre-diabetes    Stroke [facility]) 2013   hemmorahgic   Past Surgical History:  Procedure Laterality Date   ANKLE SURGERY     APPENDECTOMY     CHOLECYSTECTOMY     HARDWARE REMOVAL Left 03/29/2023   Procedure: HARDWARE REMOVAL;  Surgeon: Toni Arthurs, MD;  Location: [facility];  Service: Orthopedics;  Laterality: Left;   HERNIA REPAIR     Esophagus   INCISION AND DRAINAGE OF WOUND Left 03/29/2023   Procedure: LEFT ANKLE WOUND IRRIGATION AND DEBRIDEMENT WOUND;  Surgeon: Toni Arthurs, MD;  Location: [facility];  Service: Orthopedics;  Laterality: Left;   JOINT REPLACEMENT     total- right partial- left   MASTECTOMY PARTIAL / LUMPECTOMY  2012   left   ORIF ANKLE FRACTURE Left 07/20/2018   Procedure: OPEN REDUCTION INTERNAL FIXATION (ORIF) ANKLE FRACTURE;  Surgeon: Toni Arthurs, MD;  Location: [facility];  Service: Orthopedics;  Laterality: Left;   TOTAL KNEE  ARTHROPLASTY Left 01/27/2019   Procedure: TOTAL KNEE ARTHROPLASTY;  Surgeon: Dannielle Huh, MD;  Location: [facility];  Service: Orthopedics;  Laterality: Left;   Patient Active Problem List   Diagnosis Date Noted   Goals of care, counseling/discussion 01/17/2022   Grief 01/17/2022   Secondary hypercoagulable state (HCC) 03/16/2021   Dizziness 03/10/2021   Orthostatic hypotension 10/15/2020   Presbycusis of both ears 03/08/2020   Mixed stress and urge urinary incontinence 12/02/2019   Macrocytosis 12/01/2019   Nutritional anemia 12/01/2019   S/P total knee replacement 01/27/2019   Recurrent left knee instability 07/05/2018   Respiratory failure with hypoxia (HCC) 08/30/2017   Hypoxemia    Heart failure with preserved ejection fraction (HCC), Grade 3 diastolic dysfunction 03/26/2017   PAF (paroxysmal atrial fibrillation) (HCC)    Dyspnea 03/19/2017   Encounter for preventive health examination 02/17/2016   Sensorineural hearing loss (SNHL), bilateral 01/26/2016   Hypomagnesemia 04/24/2014   Hemiparesis affecting left side as late effect of cerebrovascular accident (HCC) 04/24/2014   Nontraumatic cerebral hemorrhage (HCC) 04/30/2012   DM (diabetes mellitus) with complications (HCC) 03/04/2010   OSTEOPENIA 01/21/2009   UNSPECIFIED VITAMIN D DEFICIENCY 11/19/2007   HYPERCHOLESTEROLEMIA 10/25/2006   GASTROESOPHAGEAL REFLUX, NO ESOPHAGITIS 10/25/2006   DIVERTICULOSIS OF COLON  10/25/2006   Osteoarthritis 10/25/2006   CERVICAL SPINE DISORDER, NOS 10/25/2006    PCP: Waynard Edwards, MD  REFERRING PROVIDER: Waynard Edwards, MD  REFERRING DIAG: s/p left ankle ORIF hardware removal  THERAPY DIAG:  Muscle weakness (generalized)  Difficulty in walking, not elsewhere classified  Left shoulder pain, unspecified chronicity  Pain in left ankle and joints of left foot  Ataxia  Hemiplegia and hemiparesis following cerebral infarction affecting left non-dominant side (HCC)  Late effects of CVA  (cerebrovascular accident)  Cervicalgia  Abnormal posture  Rationale for Evaluation and Treatment: Rehabilitation  ONSET DATE: 03/29/23  SUBJECTIVE:   SUBJECTIVE STATEMENT: Tired and reports that the left leg is sore  PERTINENT HISTORY: CVA, CHF, HTN, A-fib, TKA PAIN:  Are you having pain? Yes: NPRS scale: 4/10 Pain location: left upper trap and neck, left ankle and knee  Pain description: ache sore Aggravating factors: neck and shoulder always hurt, knee and ankle hurt with transfers Relieving factors: massage, heat  PRECAUTIONS: None  RED FLAGS: None   WEIGHT BEARING RESTRICTIONS: No  FALLS:  Has patient fallen in last 6 months? No  LIVING ENVIRONMENT: Lives with: lives alone Lives in: House/apartment Stairs: No Has following equipment at home: Environmental consultant - 2 wheeled, Wheelchair (manual), shower chair, bed side commode, Grab bars, and Ramped entry  OCCUPATION: retired  PLOF: Needs assistance with homemaking, Needs assistance with transfers, and Leisure: plays bridge, she has 11 hours of an aide at home that helps with meals, going to MD's, dressing and bathing, is alone at night  PATIENT GOALS: walk, have less pain, transfer without difficulty  NEXT MD VISIT: none scheduled  OBJECTIVE:   DIAGNOSTIC FINDINGS: none performed  COGNITION: Overall cognitive status: Within functional limits for tasks assessed     SENSATION: WFL POSTURE: rounded shoulders and forward head  PALPATION: Left knee and ankle are swollen and tender, left upper and neck  LOWER EXTREMITY ROM:  Active ROM Right eval Left eval  Left 07/31/23 Left 08/16/23  Hip flexion       Hip extension       Hip abduction       Hip adduction       Hip internal rotation       Hip external rotation       Knee flexion  90 93 96 95  Knee extension  0 0 0 0  Ankle dorsiflexion  5     Ankle plantarflexion  30 44 49 52  Ankle inversion  5     Ankle eversion  0      (Blank rows = not  tested)  LOWER EXTREMITY MMT:  MMT Right eval Left eval Left 06/21/23 Left 07/31/23  Hip flexion 4- 3+ 4- 4  Hip extension      Hip abduction 4 3+ 4- 4+  Hip adduction 4 3+ 4- 4  Hip internal rotation      Hip external rotation      Knee flexion 4- 3+ 4 4  Knee extension 4- 3+ 4- 4  Ankle dorsiflexion      Ankle plantarflexion      Ankle inversion      Ankle eversion       (Blank rows = not tested)  FUNCTIONAL TESTS:  Transfers Max A, left knee gives out and goes into ER and tends to bow into varus, has not walked in about 18 months, and the last time I saw her we could walk about 60 feet with HHA and w/c following  GAIT: Distance walked: unable   TODAY'S TREATMENT:                                                                                                                              DATE:  08/21/23 Red tband left ankle PF/DF Red tband left knee HS curls Red tband left hip abduction and adduction 5# left leg LAQ 5# marches Nustep level 5 x 8 minutes needing cues today due to right trunk lean and left knee falling out Standing weight shifts and postural corrections Transfers today were not good, tended to lock the left knee out and have the left hip back  08/16/23 Approximation to L hand digits STM to the left upper trap, L deltoid, and neck NuStep L 5 x 7 min 500 steps  Red tband ankle PF/DF Red tband HS curls green tband hip adduction  green tband hip abduction 5# marches 5# LAQ Gait with HHA and w/c follow 7' and 46', stops due to fatigue  08/14/23 Approximation to L hand digits STM to the left upper trap, L deltoid, and neck NuStep L 5 x 7 min 500 steps  Red tband ankle PF/DF Red tband HS curls Red tband hip adduction  Red tband hip abduction 5# marches 5# LAQ Gait with HHA and w/c follow 104' and 46', stops due to fatigue 08/09/23 Nustep level 5 x 6 minutes Gait with HHA and w/c follow 46', 54' and 46', stops due to fatigue Red tband ankle  PF/DF Red tband HS curls Red tband hip adduction  Red tband hip abduction 5# marches 5# LAQ STM to the left upper trap and neck  08/06/23 I took off brace and did a refitting, she had a new caregiver today Standing weight shifts Nustep x 8 minutes 550 steps STM to the left upper trap and neck area 5# marches 5# LAQ Green tband hip adduction and abduction left only to get good contraction Green tband HS curl Gait with HHA 2x 55 feet with w/c follow  08/02/23 Nustep level 5 x 7 minutes 500 steps STM to UT and cervical spine  MHP UT cervical spine   Green tband Hip adduction left Green tband hip abduction left  Green tband HS curls  5# LAQ 5# marches Green tband HS curls Gait with HHA and W/C follow 70 feet x , then 8ft   07/31/23 Nustep level 5 x 7 minutes 500 steps MHP UT cervical spine   Green tband Hip adduction left Green tband hip abduction left  Green tband HS curls  5# LAQ 5# marches Gait with HHA and W/C follow 70 feet x 1 again no L knee brace  07/24/23 Nustep level 5 x 7 minutes 500 steps MHP UT cervical spine   Green tband Hip adduction left Green tband hip abduction left  Green tband HS curls  5# LAQ 5# marches Green tband ankle PF/DF Gait with HHA and W/C follow 50 feet x 1 today due  to some knee soreness forgot brace  PATIENT EDUCATION:  Education details: POC Person educated: Patient Education method: Explanation Education comprehension: verbalized understanding  HOME EXERCISE PROGRAM: TBD  ASSESSMENT:  CLINICAL IMPRESSION: Pt having more difficulty with transfers today, was locking the left knee out and the left hip was rotated back, did not do well with bearing weight due to the positioning.  She also needed a lot of cues while on the nustep due to her leaning to the right and the left knee falling out.  I avoided walking today due to feeling that this could be unsafe, so we opted to stand and do some weight shifts and postural corrections,  she did well with the cues for this and got better, tended to still lean back and to the right at times OBJECTIVE IMPAIRMENTS: Abnormal gait, cardiopulmonary status limiting activity, decreased activity tolerance, decreased balance, decreased coordination, decreased endurance, decreased mobility, difficulty walking, decreased ROM, decreased strength, increased edema, impaired flexibility, improper body mechanics, postural dysfunction, and pain.   REHAB POTENTIAL: Good  CLINICAL DECISION MAKING: Stable/uncomplicated  EVALUATION COMPLEXITY: Low   GOALS: Goals reviewed with patient? Yes  SHORT TERM GOALS: Target date: 06/02/23 Independent with advanced HEP Goal status: met 06/07/23  LONG TERM GOALS: Target date: 08/21/23  Independent with advanced HEP with caregiver Goal status: ongoing 07/17/23  2.  Transfer with set up and CGA Goal status: Ongoing 08/16/23  3.  Walk HHA x 100 feet Goal status: Progressing 08/09/23  4.  Increase left LE strength to 4/5 Goal status: Met 07/31/23  5.  Report neck and shoulder pain decreased 50% Goal status: Progressing 30% 08/09/23  PLAN:  PT FREQUENCY: 1-2x/week  PT DURATION: 12 weeks  PLANNED INTERVENTIONS: Therapeutic exercises, Therapeutic activity, Neuromuscular re-education, Balance training, Gait training, Patient/Family education, Self Care, Joint mobilization, Moist heat, Taping, and Manual therapy  PLAN FOR NEXT SESSION: Next visit will assess and do renewal   Jearld Lesch, PT

## 2023-08-23 ENCOUNTER — Ambulatory Visit: Payer: Medicare Other | Admitting: Physical Therapy

## 2023-08-23 ENCOUNTER — Encounter: Payer: Self-pay | Admitting: Physical Therapy

## 2023-08-23 DIAGNOSIS — M25572 Pain in left ankle and joints of left foot: Secondary | ICD-10-CM

## 2023-08-23 DIAGNOSIS — M25512 Pain in left shoulder: Secondary | ICD-10-CM

## 2023-08-23 DIAGNOSIS — R262 Difficulty in walking, not elsewhere classified: Secondary | ICD-10-CM

## 2023-08-23 DIAGNOSIS — R270 Ataxia, unspecified: Secondary | ICD-10-CM | POA: Diagnosis not present

## 2023-08-23 DIAGNOSIS — M6281 Muscle weakness (generalized): Secondary | ICD-10-CM | POA: Diagnosis not present

## 2023-08-23 DIAGNOSIS — I69354 Hemiplegia and hemiparesis following cerebral infarction affecting left non-dominant side: Secondary | ICD-10-CM | POA: Diagnosis not present

## 2023-08-23 DIAGNOSIS — M542 Cervicalgia: Secondary | ICD-10-CM

## 2023-08-23 DIAGNOSIS — I6990 Unspecified sequelae of unspecified cerebrovascular disease: Secondary | ICD-10-CM

## 2023-08-23 NOTE — Therapy (Signed)
OUTPATIENT PHYSICAL THERAPY LOWER EXTREMITY TREATMENT   Patient Name: Karen Jackson MRN: [id number] DOB:[birth year], 82 y.o., female Today's Date: 08/23/2023  END OF SESSION:  PT End of Session - 08/23/23 1109     Visit Number 27    Date for PT Re-Evaluation 09/23/23    Authorization Type Medicare    PT Start Time 1058    PT Stop Time 1143    PT Time Calculation (min) 45 min    Activity Tolerance Patient tolerated treatment well    Behavior During Therapy [facility] for tasks assessed/performed             Past Medical History:  Diagnosis Date   Acute cystitis without hematuria    Acute diastolic CHF (congestive heart failure) (HCC)    Arthritis    Dyspnea    Dysrhythmia    Fever of unknown origin 03/19/2017   Hyperlipidemia    Hypertension    denies at preop   Multifocal pneumonia    Neuromuscular disorder (HCC)    neuropathy left arm and foot   Osteopenia    Paralysis (HCC)    partial left side from CVA    Persistent atrial fibrillation (HCC)    PONV (postoperative nausea and vomiting)    Pre-diabetes    Stroke [facility]) 2013   hemmorahgic   Past Surgical History:  Procedure Laterality Date   ANKLE SURGERY     APPENDECTOMY     CHOLECYSTECTOMY     HARDWARE REMOVAL Left 03/29/2023   Procedure: HARDWARE REMOVAL;  Surgeon: Toni Arthurs, MD;  Location: [facility];  Service: Orthopedics;  Laterality: Left;   HERNIA REPAIR     Esophagus   INCISION AND DRAINAGE OF WOUND Left 03/29/2023   Procedure: LEFT ANKLE WOUND IRRIGATION AND DEBRIDEMENT WOUND;  Surgeon: Toni Arthurs, MD;  Location: [facility];  Service: Orthopedics;  Laterality: Left;   JOINT REPLACEMENT     total- right partial- left   MASTECTOMY PARTIAL / LUMPECTOMY  2012   left   ORIF ANKLE FRACTURE Left 07/20/2018   Procedure: OPEN REDUCTION INTERNAL FIXATION (ORIF) ANKLE FRACTURE;  Surgeon: Toni Arthurs, MD;  Location: [facility];  Service: Orthopedics;  Laterality: Left;   TOTAL KNEE  ARTHROPLASTY Left 01/27/2019   Procedure: TOTAL KNEE ARTHROPLASTY;  Surgeon: Dannielle Huh, MD;  Location: [facility];  Service: Orthopedics;  Laterality: Left;   Patient Active Problem List   Diagnosis Date Noted   Goals of care, counseling/discussion 01/17/2022   Grief 01/17/2022   Secondary hypercoagulable state (HCC) 03/16/2021   Dizziness 03/10/2021   Orthostatic hypotension 10/15/2020   Presbycusis of both ears 03/08/2020   Mixed stress and urge urinary incontinence 12/02/2019   Macrocytosis 12/01/2019   Nutritional anemia 12/01/2019   S/P total knee replacement 01/27/2019   Recurrent left knee instability 07/05/2018   Respiratory failure with hypoxia (HCC) 08/30/2017   Hypoxemia    Heart failure with preserved ejection fraction (HCC), Grade 3 diastolic dysfunction 03/26/2017   PAF (paroxysmal atrial fibrillation) (HCC)    Dyspnea 03/19/2017   Encounter for preventive health examination 02/17/2016   Sensorineural hearing loss (SNHL), bilateral 01/26/2016   Hypomagnesemia 04/24/2014   Hemiparesis affecting left side as late effect of cerebrovascular accident (HCC) 04/24/2014   Nontraumatic cerebral hemorrhage (HCC) 04/30/2012   DM (diabetes mellitus) with complications (HCC) 03/04/2010   OSTEOPENIA 01/21/2009   UNSPECIFIED VITAMIN D DEFICIENCY 11/19/2007   HYPERCHOLESTEROLEMIA 10/25/2006   GASTROESOPHAGEAL REFLUX, NO ESOPHAGITIS 10/25/2006   DIVERTICULOSIS OF COLON  10/25/2006   Osteoarthritis 10/25/2006   CERVICAL SPINE DISORDER, NOS 10/25/2006    PCP: Waynard Edwards, MD  REFERRING PROVIDER: Waynard Edwards, MD  REFERRING DIAG: s/p left ankle ORIF hardware removal  THERAPY DIAG:  Muscle weakness (generalized)  Left shoulder pain, unspecified chronicity  Pain in left ankle and joints of left foot  Ataxia  Difficulty in walking, not elsewhere classified  Hemiplegia and hemiparesis following cerebral infarction affecting left non-dominant side (HCC)  Late effects of CVA  (cerebrovascular accident)  Cervicalgia  Rationale for Evaluation and Treatment: Rehabilitation  ONSET DATE: 03/29/23  SUBJECTIVE:   SUBJECTIVE STATEMENT: Patient reports that she is very sore all over today, no falls  PERTINENT HISTORY: CVA, CHF, HTN, A-fib, TKA PAIN:  Are you having pain? Yes: NPRS scale: 6/10 Pain location: left upper trap and neck, left ankle and knee  Pain description: ache sore Aggravating factors: neck and shoulder always hurt, knee and ankle hurt with transfers Relieving factors: massage, heat  PRECAUTIONS: None  RED FLAGS: None   WEIGHT BEARING RESTRICTIONS: No  FALLS:  Has patient fallen in last 6 months? No  LIVING ENVIRONMENT: Lives with: lives alone Lives in: House/apartment Stairs: No Has following equipment at home: Environmental consultant - 2 wheeled, Wheelchair (manual), shower chair, bed side commode, Grab bars, and Ramped entry  OCCUPATION: retired  PLOF: Needs assistance with homemaking, Needs assistance with transfers, and Leisure: plays bridge, she has 11 hours of an aide at home that helps with meals, going to MD's, dressing and bathing, is alone at night  PATIENT GOALS: walk, have less pain, transfer without difficulty  NEXT MD VISIT: none scheduled  OBJECTIVE:   DIAGNOSTIC FINDINGS: none performed  COGNITION: Overall cognitive status: Within functional limits for tasks assessed     SENSATION: WFL POSTURE: rounded shoulders and forward head  PALPATION: Left knee and ankle are swollen and tender, left upper and neck  LOWER EXTREMITY ROM:  Active ROM Right eval Left eval  Left 07/31/23 Left 08/16/23  Hip flexion       Hip extension       Hip abduction       Hip adduction       Hip internal rotation       Hip external rotation       Knee flexion  90 93 96 95  Knee extension  0 0 0 0  Ankle dorsiflexion  5     Ankle plantarflexion  30 44 49 52  Ankle inversion  5     Ankle eversion  0      (Blank rows = not tested)  LOWER  EXTREMITY MMT:  MMT Right eval Left eval Left 06/21/23 Left 07/31/23 08/23/23 left  Hip flexion 4- 3+ 4- 4 4-  Hip extension       Hip abduction 4 3+ 4- 4+ 4  Hip adduction 4 3+ 4- 4 4-  Hip internal rotation       Hip external rotation       Knee flexion 4- 3+ 4 4 4+  Knee extension 4- 3+ 4- 4 4  Ankle dorsiflexion     4-  Ankle plantarflexion     4-  Ankle inversion       Ankle eversion        (Blank rows = not tested)  FUNCTIONAL TESTS:  Transfers Max A, left knee gives out and goes into ER and tends to bow into varus, has not walked in about 18 months, and the last time I  saw her we could walk about 60 feet with HHA and w/c following  GAIT: Distance walked: unable   TODAY'S TREATMENT:                                                                                                                              DATE:  08/23/23 STM to the left upper trap and neck Nustep level 5 x 8 minutes less trunk lean and better control of the left leg today MMT and testing of transfers and gait If she does not have anything to pull up on , she cannot push up into standing from sitting needs Max A like this with PT in front holding on she min/modA 5# LAQ\ 5# Marches Red tband left hip abduction and adduction Gait with HHA and w/c follow 54 feet  08/21/23 Red tband left ankle PF/DF Red tband left knee HS curls Red tband left hip abduction and adduction 5# left leg LAQ 5# marches Nustep level 5 x 8 minutes needing cues today due to right trunk lean and left knee falling out Standing weight shifts and postural corrections Transfers today were not good, tended to lock the left knee out and have the left hip back  08/16/23 Approximation to L hand digits STM to the left upper trap, L deltoid, and neck NuStep L 5 x 7 min 500 steps  Red tband ankle PF/DF Red tband HS curls green tband hip adduction  green tband hip abduction 5# marches 5# LAQ Gait with HHA and w/c follow 37' and  46', stops due to fatigue  08/14/23 Approximation to L hand digits STM to the left upper trap, L deltoid, and neck NuStep L 5 x 7 min 500 steps  Red tband ankle PF/DF Red tband HS curls Red tband hip adduction  Red tband hip abduction 5# marches 5# LAQ Gait with HHA and w/c follow 43' and 46', stops due to fatigue 08/09/23 Nustep level 5 x 6 minutes Gait with HHA and w/c follow 46', 54' and 46', stops due to fatigue Red tband ankle PF/DF Red tband HS curls Red tband hip adduction  Red tband hip abduction 5# marches 5# LAQ STM to the left upper trap and neck  08/06/23 I took off brace and did a refitting, she had a new caregiver today Standing weight shifts Nustep x 8 minutes 550 steps STM to the left upper trap and neck area 5# marches 5# LAQ Green tband hip adduction and abduction left only to get good contraction Green tband HS curl Gait with HHA 2x 55 feet with w/c follow  08/02/23 Nustep level 5 x 7 minutes 500 steps STM to UT and cervical spine  MHP UT cervical spine   Green tband Hip adduction left Green tband hip abduction left  Green tband HS curls  5# LAQ 5# marches Green tband HS curls Gait with HHA and W/C follow 70 feet x , then 59ft   07/31/23 Nustep level  5 x 7 minutes 500 steps MHP UT cervical spine   Green tband Hip adduction left Green tband hip abduction left  Green tband HS curls  5# LAQ 5# marches Gait with HHA and W/C follow 70 feet x 1 again no L knee brace  07/24/23 Nustep level 5 x 7 minutes 500 steps MHP UT cervical spine   Green tband Hip adduction left Green tband hip abduction left  Green tband HS curls  5# LAQ 5# marches Green tband ankle PF/DF Gait with HHA and W/C follow 50 feet x 1 today due to some knee soreness forgot brace  PATIENT EDUCATION:  Education details: POC Person educated: Patient Education method: Explanation Education comprehension: verbalized understanding  HOME EXERCISE  PROGRAM: TBD  ASSESSMENT:  CLINICAL IMPRESSION: Pt has had some ups and downs with some health issues, knee pain and at times knee giving out on her, she has tried different braces recently, we did all treatment today without knee brace.  Has been able to do 55' HHA walking consistently lately, with some left medial knee pain.  I avoided walking 2 days ago due to the left knee giving out and her having difficulty controlling it, she continues with the left upper trap and neck pain OBJECTIVE IMPAIRMENTS: Abnormal gait, cardiopulmonary status limiting activity, decreased activity tolerance, decreased balance, decreased coordination, decreased endurance, decreased mobility, difficulty walking, decreased ROM, decreased strength, increased edema, impaired flexibility, improper body mechanics, postural dysfunction, and pain.   REHAB POTENTIAL: Good  CLINICAL DECISION MAKING: Stable/uncomplicated  EVALUATION COMPLEXITY: Low   GOALS: Goals reviewed with patient? Yes  SHORT TERM GOALS: Target date: 06/02/23 Independent with advanced HEP Goal status: met 06/07/23  LONG TERM GOALS: Target date: 08/21/23  Independent with advanced HEP with caregiver Goal status: ongoing 08/23/23  2.  Transfer with set up and CGA Goal status: Ongoing 08/23/23 min/mod A  3.  Walk HHA x 100 feet Goal status: Progressing 08/23/23 54'  4.  Increase left LE strength to 4/5 Goal status: Met 07/31/23  5.  Report neck and shoulder pain decreased 50% Goal status: Progressing 30% 08/23/23  PLAN:  PT FREQUENCY: 1-2x/week  PT DURATION: 12 weeks  PLANNED INTERVENTIONS: Therapeutic exercises, Therapeutic activity, Neuromuscular re-education, Balance training, Gait training, Patient/Family education, Self Care, Joint mobilization, Moist heat, Taping, and Manual therapy  PLAN FOR NEXT SESSION:sent in renewal   Jearld Lesch, PT

## 2023-08-28 ENCOUNTER — Ambulatory Visit: Payer: Medicare Other | Admitting: Physical Therapy

## 2023-08-28 DIAGNOSIS — I5032 Chronic diastolic (congestive) heart failure: Secondary | ICD-10-CM | POA: Diagnosis not present

## 2023-08-28 DIAGNOSIS — R790 Abnormal level of blood mineral: Secondary | ICD-10-CM | POA: Diagnosis not present

## 2023-08-29 LAB — BASIC METABOLIC PANEL
BUN/Creatinine Ratio: 17 (ref 12–28)
BUN: 12 mg/dL (ref 8–27)
CO2: 27 mmol/L (ref 20–29)
Calcium: 9.4 mg/dL (ref 8.7–10.3)
Chloride: 94 mmol/L — ABNORMAL LOW (ref 96–106)
Creatinine, Ser: 0.72 mg/dL (ref 0.57–1.00)
Glucose: 151 mg/dL — ABNORMAL HIGH (ref 70–99)
Potassium: 4 mmol/L (ref 3.5–5.2)
Sodium: 137 mmol/L (ref 134–144)
eGFR: 83 mL/min/{1.73_m2} (ref >59)

## 2023-08-29 LAB — MAGNESIUM: Magnesium: 1.9 mg/dL (ref 1.6–2.3)

## 2023-08-30 ENCOUNTER — Encounter: Payer: Self-pay | Admitting: Physical Therapy

## 2023-08-30 ENCOUNTER — Ambulatory Visit: Payer: Medicare Other | Admitting: Podiatry

## 2023-08-30 ENCOUNTER — Ambulatory Visit: Payer: Medicare Other | Attending: Internal Medicine | Admitting: Physical Therapy

## 2023-08-30 DIAGNOSIS — I6990 Unspecified sequelae of unspecified cerebrovascular disease: Secondary | ICD-10-CM | POA: Insufficient documentation

## 2023-08-30 DIAGNOSIS — R262 Difficulty in walking, not elsewhere classified: Secondary | ICD-10-CM | POA: Insufficient documentation

## 2023-08-30 DIAGNOSIS — M25572 Pain in left ankle and joints of left foot: Secondary | ICD-10-CM | POA: Insufficient documentation

## 2023-08-30 DIAGNOSIS — R270 Ataxia, unspecified: Secondary | ICD-10-CM | POA: Diagnosis not present

## 2023-08-30 DIAGNOSIS — R293 Abnormal posture: Secondary | ICD-10-CM | POA: Insufficient documentation

## 2023-08-30 DIAGNOSIS — M6281 Muscle weakness (generalized): Secondary | ICD-10-CM | POA: Insufficient documentation

## 2023-08-30 DIAGNOSIS — M542 Cervicalgia: Secondary | ICD-10-CM | POA: Diagnosis not present

## 2023-08-30 DIAGNOSIS — M25512 Pain in left shoulder: Secondary | ICD-10-CM | POA: Insufficient documentation

## 2023-08-30 DIAGNOSIS — I69354 Hemiplegia and hemiparesis following cerebral infarction affecting left non-dominant side: Secondary | ICD-10-CM | POA: Diagnosis not present

## 2023-08-30 NOTE — Therapy (Signed)
 OUTPATIENT PHYSICAL THERAPY LOWER EXTREMITY TREATMENT   Patient Name: Karen Jackson MRN: [id number] DOB:March 11, 1941, 83 y.o., female Today's Date: 08/30/2023  END OF SESSION:  PT End of Session - 08/30/23 1133     Visit Number 28    Date for PT Re-Evaluation 09/23/23    Authorization Type Medicare    PT Start Time 1058    PT Stop Time 1143    PT Time Calculation (min) 45 min    Activity Tolerance Patient tolerated treatment well    Behavior During Therapy [facility] for tasks assessed/performed             Past Medical History:  Diagnosis Date   Acute cystitis without hematuria    Acute diastolic CHF (congestive heart failure) (HCC)    Arthritis    Dyspnea    Dysrhythmia    Fever of unknown origin 03/19/2017   Hyperlipidemia    Hypertension    denies at preop   Multifocal pneumonia    Neuromuscular disorder (HCC)    neuropathy left arm and foot   Osteopenia    Paralysis (HCC)    partial left side from CVA    Persistent atrial fibrillation (HCC)    PONV (postoperative nausea and vomiting)    Pre-diabetes    Stroke [facility]) 2013   hemmorahgic   Past Surgical History:  Procedure Laterality Date   ANKLE SURGERY     APPENDECTOMY     CHOLECYSTECTOMY     HARDWARE REMOVAL Left 03/29/2023   Procedure: HARDWARE REMOVAL;  Surgeon: Kit Rush, MD;  Location: [facility];  Service: Orthopedics;  Laterality: Left;   HERNIA REPAIR     Esophagus   INCISION AND DRAINAGE OF WOUND Left 03/29/2023   Procedure: LEFT ANKLE WOUND IRRIGATION AND DEBRIDEMENT WOUND;  Surgeon: Kit Rush, MD;  Location: [facility];  Service: Orthopedics;  Laterality: Left;   JOINT REPLACEMENT     total- right partial- left   MASTECTOMY PARTIAL / LUMPECTOMY  2012   left   ORIF ANKLE FRACTURE Left 07/20/2018   Procedure: OPEN REDUCTION INTERNAL FIXATION (ORIF) ANKLE FRACTURE;  Surgeon: Kit Rush, MD;  Location: [facility];  Service: Orthopedics;  Laterality: Left;   TOTAL KNEE  ARTHROPLASTY Left 01/27/2019   Procedure: TOTAL KNEE ARTHROPLASTY;  Surgeon: Rubie Kemps, MD;  Location: [facility];  Service: Orthopedics;  Laterality: Left;   Patient Active Problem List   Diagnosis Date Noted   Goals of care, counseling/discussion 01/17/2022   Grief 01/17/2022   Secondary hypercoagulable state (HCC) 03/16/2021   Dizziness 03/10/2021   Orthostatic hypotension 10/15/2020   Presbycusis of both ears 03/08/2020   Mixed stress and urge urinary incontinence 12/02/2019   Macrocytosis 12/01/2019   Nutritional anemia 12/01/2019   S/P total knee replacement 01/27/2019   Recurrent left knee instability 07/05/2018   Respiratory failure with hypoxia (HCC) 08/30/2017   Hypoxemia    Heart failure with preserved ejection fraction (HCC), Grade 3 diastolic dysfunction 03/26/2017   PAF (paroxysmal atrial fibrillation) (HCC)    Dyspnea 03/19/2017   Encounter for preventive health examination 02/17/2016   Sensorineural hearing loss (SNHL), bilateral 01/26/2016   Hypomagnesemia 04/24/2014   Hemiparesis affecting left side as late effect of cerebrovascular accident (HCC) 04/24/2014   Nontraumatic cerebral hemorrhage (HCC) 04/30/2012   DM (diabetes mellitus) with complications (HCC) 03/04/2010   OSTEOPENIA 01/21/2009   UNSPECIFIED VITAMIN D  DEFICIENCY 11/19/2007   HYPERCHOLESTEROLEMIA 10/25/2006   GASTROESOPHAGEAL REFLUX, NO ESOPHAGITIS 10/25/2006   DIVERTICULOSIS OF COLON  10/25/2006   Osteoarthritis 10/25/2006   CERVICAL SPINE DISORDER, NOS 10/25/2006    PCP: Shayne, MD  REFERRING PROVIDER: Shayne, MD  REFERRING DIAG: s/p left ankle ORIF hardware removal  THERAPY DIAG:  Muscle weakness (generalized)  Left shoulder pain, unspecified chronicity  Pain in left ankle and joints of left foot  Ataxia  Difficulty in walking, not elsewhere classified  Hemiplegia and hemiparesis following cerebral infarction affecting left non-dominant side (HCC)  Late effects of CVA  (cerebrovascular accident)  Cervicalgia  Rationale for Evaluation and Treatment: Rehabilitation  ONSET DATE: 03/29/23  SUBJECTIVE:   SUBJECTIVE STATEMENT: Patient reports that this AM she really had the left knee buckle a few times with transfers, reports no real increase of activity but it is colder.  Reports that she cancelled Tuesday due to not feeling well  PERTINENT HISTORY: CVA, CHF, HTN, A-fib, TKA PAIN:  Are you having pain? Yes: NPRS scale: 6/10 Pain location: left upper trap and neck, left ankle and knee  Pain description: ache sore Aggravating factors: neck and shoulder always hurt, knee and ankle hurt with transfers Relieving factors: massage, heat  PRECAUTIONS: None  RED FLAGS: None   WEIGHT BEARING RESTRICTIONS: No  FALLS:  Has patient fallen in last 6 months? No  LIVING ENVIRONMENT: Lives with: lives alone Lives in: House/apartment Stairs: No Has following equipment at home: Environmental Consultant - 2 wheeled, Wheelchair (manual), shower chair, bed side commode, Grab bars, and Ramped entry  OCCUPATION: retired  PLOF: Needs assistance with homemaking, Needs assistance with transfers, and Leisure: plays bridge, she has 11 hours of an aide at home that helps with meals, going to MD's, dressing and bathing, is alone at night  PATIENT GOALS: walk, have less pain, transfer without difficulty  NEXT MD VISIT: none scheduled  OBJECTIVE:   DIAGNOSTIC FINDINGS: none performed  COGNITION: Overall cognitive status: Within functional limits for tasks assessed     SENSATION: WFL POSTURE: rounded shoulders and forward head  PALPATION: Left knee and ankle are swollen and tender, left upper and neck  LOWER EXTREMITY ROM:  Active ROM Right eval Left eval  Left 07/31/23 Left 08/16/23  Hip flexion       Hip extension       Hip abduction       Hip adduction       Hip internal rotation       Hip external rotation       Knee flexion  90 93 96 95  Knee extension  0 0 0 0   Ankle dorsiflexion  5     Ankle plantarflexion  30 44 49 52  Ankle inversion  5     Ankle eversion  0      (Blank rows = not tested)  LOWER EXTREMITY MMT:  MMT Right eval Left eval Left 06/21/23 Left 07/31/23 08/23/23 left  Hip flexion 4- 3+ 4- 4 4-  Hip extension       Hip abduction 4 3+ 4- 4+ 4  Hip adduction 4 3+ 4- 4 4-  Hip internal rotation       Hip external rotation       Knee flexion 4- 3+ 4 4 4+  Knee extension 4- 3+ 4- 4 4  Ankle dorsiflexion     4-  Ankle plantarflexion     4-  Ankle inversion       Ankle eversion        (Blank rows = not tested)  FUNCTIONAL TESTS:  Transfers Max A,  left knee gives out and goes into ER and tends to bow into varus, has not walked in about 18 months, and the last time I saw her we could walk about 60 feet with HHA and w/c following  GAIT: Distance walked: unable   TODAY'S TREATMENT:                                                                                                                              DATE:  08/30/23 Red tband ankle PF/DF Red tband hip adduction and abduction Red HS curls 5# LAQ 3# marches STM to the left upper trap and neck Nustep level 5 x 8 minutes Walking with HHA and w/c follow x 40 feet stopped due to left medial knee pain Standing weight shift trying to help with the knee pain  08/23/23 STM to the left upper trap and neck Nustep level 5 x 8 minutes less trunk lean and better control of the left leg today MMT and testing of transfers and gait If she does not have anything to pull up on , she cannot push up into standing from sitting needs Max A like this with PT in front holding on she min/modA 5# LAQ\ 5# Marches Red tband left hip abduction and adduction Gait with HHA and w/c follow 54 feet  08/21/23 Red tband left ankle PF/DF Red tband left knee HS curls Red tband left hip abduction and adduction 5# left leg LAQ 5# marches Nustep level 5 x 8 minutes needing cues today due to right  trunk lean and left knee falling out Standing weight shifts and postural corrections Transfers today were not good, tended to lock the left knee out and have the left hip back  08/16/23 Approximation to L hand digits STM to the left upper trap, L deltoid, and neck NuStep L 5 x 7 min 500 steps  Red tband ankle PF/DF Red tband HS curls green tband hip adduction  green tband hip abduction 5# marches 5# LAQ Gait with HHA and w/c follow 60' and 46', stops due to fatigue  08/14/23 Approximation to L hand digits STM to the left upper trap, L deltoid, and neck NuStep L 5 x 7 min 500 steps  Red tband ankle PF/DF Red tband HS curls Red tband hip adduction  Red tband hip abduction 5# marches 5# LAQ Gait with HHA and w/c follow 35' and 46', stops due to fatigue 08/09/23 Nustep level 5 x 6 minutes Gait with HHA and w/c follow 46', 54' and 46', stops due to fatigue Red tband ankle PF/DF Red tband HS curls Red tband hip adduction  Red tband hip abduction 5# marches 5# LAQ STM to the left upper trap and neck  08/06/23 I took off brace and did a refitting, she had a new caregiver today Standing weight shifts Nustep x 8 minutes 550 steps STM to the left upper trap and neck area 5# marches 5# LAQ Green tband hip adduction  and abduction left only to get good contraction Green tband HS curl Gait with HHA 2x 55 feet with w/c follow  PATIENT EDUCATION:  Education details: POC Person educated: Patient Education method: Explanation Education comprehension: verbalized understanding  HOME EXERCISE PROGRAM: TBD  ASSESSMENT:  CLINICAL IMPRESSION: Pt has reported knee buckled today during transfer to get in the car, she did not have that happen during treatment today but the walk was cut short due to medial left knee pain she did not have the knee brace on today. She still has a lot of tension in the left upper trap  OBJECTIVE IMPAIRMENTS: Abnormal gait, cardiopulmonary status  limiting activity, decreased activity tolerance, decreased balance, decreased coordination, decreased endurance, decreased mobility, difficulty walking, decreased ROM, decreased strength, increased edema, impaired flexibility, improper body mechanics, postural dysfunction, and pain.   REHAB POTENTIAL: Good  CLINICAL DECISION MAKING: Stable/uncomplicated  EVALUATION COMPLEXITY: Low   GOALS: Goals reviewed with patient? Yes  SHORT TERM GOALS: Target date: 06/02/23 Independent with advanced HEP Goal status: met 06/07/23  LONG TERM GOALS: Target date: 08/21/23  Independent with advanced HEP with caregiver Goal status: ongoing 08/23/23  2.  Transfer with set up and CGA Goal status: Ongoing 08/23/23 min/mod A  3.  Walk HHA x 100 feet Goal status: Progressing 08/23/23 54'  4.  Increase left LE strength to 4/5 Goal status: Met 07/31/23  5.  Report neck and shoulder pain decreased 50% Goal status: Progressing 30% 08/23/23  PLAN:  PT FREQUENCY: 1-2x/week  PT DURATION: 12 weeks  PLANNED INTERVENTIONS: Therapeutic exercises, Therapeutic activity, Neuromuscular re-education, Balance training, Gait training, Patient/Family education, Self Care, Joint mobilization, Moist heat, Taping, and Manual therapy  PLAN FOR NEXT SESSION:will work on her safe transfers and try to help with stabilization of the left knee   OBADIAH OZELL ORN, PT

## 2023-09-04 ENCOUNTER — Ambulatory Visit: Payer: Medicare Other | Admitting: Physical Therapy

## 2023-09-04 ENCOUNTER — Encounter: Payer: Self-pay | Admitting: Physical Therapy

## 2023-09-04 DIAGNOSIS — I69354 Hemiplegia and hemiparesis following cerebral infarction affecting left non-dominant side: Secondary | ICD-10-CM | POA: Diagnosis not present

## 2023-09-04 DIAGNOSIS — R270 Ataxia, unspecified: Secondary | ICD-10-CM | POA: Diagnosis not present

## 2023-09-04 DIAGNOSIS — M25512 Pain in left shoulder: Secondary | ICD-10-CM

## 2023-09-04 DIAGNOSIS — M6281 Muscle weakness (generalized): Secondary | ICD-10-CM

## 2023-09-04 DIAGNOSIS — R262 Difficulty in walking, not elsewhere classified: Secondary | ICD-10-CM

## 2023-09-04 DIAGNOSIS — M25572 Pain in left ankle and joints of left foot: Secondary | ICD-10-CM | POA: Diagnosis not present

## 2023-09-04 DIAGNOSIS — M542 Cervicalgia: Secondary | ICD-10-CM

## 2023-09-04 DIAGNOSIS — R293 Abnormal posture: Secondary | ICD-10-CM

## 2023-09-04 DIAGNOSIS — I6990 Unspecified sequelae of unspecified cerebrovascular disease: Secondary | ICD-10-CM

## 2023-09-04 NOTE — Therapy (Signed)
 OUTPATIENT PHYSICAL THERAPY LOWER EXTREMITY TREATMENT   Patient Name: Karen Jackson MRN: [id number] DOB:11-03-1940, 83 y.o., female Today's Date: 09/04/2023  END OF SESSION:  PT End of Session - 09/04/23 1105     Visit Number 29    Date for PT Re-Evaluation 09/23/23    Authorization Type Medicare    PT Start Time 1059    PT Stop Time 1145    PT Time Calculation (min) 46 min    Activity Tolerance Patient tolerated treatment well    Behavior During Therapy WFL for tasks assessed/performed             Past Medical History:  Diagnosis Date   Acute cystitis without hematuria    Acute diastolic CHF (congestive heart failure) (HCC)    Arthritis    Dyspnea    Dysrhythmia    Fever of unknown origin 03/19/2017   Hyperlipidemia    Hypertension    denies at preop   Multifocal pneumonia    Neuromuscular disorder (HCC)    neuropathy left arm and foot   Osteopenia    Paralysis (HCC)    partial left side from CVA    Persistent atrial fibrillation (HCC)    PONV (postoperative nausea and vomiting)    Pre-diabetes    Stroke [facility]) 2013   hemmorahgic   Past Surgical History:  Procedure Laterality Date   ANKLE SURGERY     APPENDECTOMY     CHOLECYSTECTOMY     HARDWARE REMOVAL Left 03/29/2023   Procedure: HARDWARE REMOVAL;  Surgeon: Kit Rush, MD;  Location: [facility];  Service: Orthopedics;  Laterality: Left;   HERNIA REPAIR     Esophagus   INCISION AND DRAINAGE OF WOUND Left 03/29/2023   Procedure: LEFT ANKLE WOUND IRRIGATION AND DEBRIDEMENT WOUND;  Surgeon: Kit Rush, MD;  Location: [facility];  Service: Orthopedics;  Laterality: Left;   JOINT REPLACEMENT     total- right partial- left   MASTECTOMY PARTIAL / LUMPECTOMY  2012   left   ORIF ANKLE FRACTURE Left 07/20/2018   Procedure: OPEN REDUCTION INTERNAL FIXATION (ORIF) ANKLE FRACTURE;  Surgeon: Kit Rush, MD;  Location: [facility];  Service: Orthopedics;  Laterality: Left;   TOTAL KNEE  ARTHROPLASTY Left 01/27/2019   Procedure: TOTAL KNEE ARTHROPLASTY;  Surgeon: Rubie Kemps, MD;  Location: [facility];  Service: Orthopedics;  Laterality: Left;   Patient Active Problem List   Diagnosis Date Noted   Goals of care, counseling/discussion 01/17/2022   Grief 01/17/2022   Secondary hypercoagulable state (HCC) 03/16/2021   Dizziness 03/10/2021   Orthostatic hypotension 10/15/2020   Presbycusis of both ears 03/08/2020   Mixed stress and urge urinary incontinence 12/02/2019   Macrocytosis 12/01/2019   Nutritional anemia 12/01/2019   S/P total knee replacement 01/27/2019   Recurrent left knee instability 07/05/2018   Respiratory failure with hypoxia (HCC) 08/30/2017   Hypoxemia    Heart failure with preserved ejection fraction (HCC), Grade 3 diastolic dysfunction 03/26/2017   PAF (paroxysmal atrial fibrillation) (HCC)    Dyspnea 03/19/2017   Encounter for preventive health examination 02/17/2016   Sensorineural hearing loss (SNHL), bilateral 01/26/2016   Hypomagnesemia 04/24/2014   Hemiparesis affecting left side as late effect of cerebrovascular accident (HCC) 04/24/2014   Nontraumatic cerebral hemorrhage (HCC) 04/30/2012   DM (diabetes mellitus) with complications (HCC) 03/04/2010   OSTEOPENIA 01/21/2009   UNSPECIFIED VITAMIN D  DEFICIENCY 11/19/2007   HYPERCHOLESTEROLEMIA 10/25/2006   GASTROESOPHAGEAL REFLUX, NO ESOPHAGITIS 10/25/2006   DIVERTICULOSIS OF COLON  10/25/2006   Osteoarthritis 10/25/2006   CERVICAL SPINE DISORDER, NOS 10/25/2006    PCP: Shayne, MD  REFERRING PROVIDER: Shayne, MD  REFERRING DIAG: s/p left ankle ORIF hardware removal  THERAPY DIAG:  Muscle weakness (generalized)  Left shoulder pain, unspecified chronicity  Pain in left ankle and joints of left foot  Ataxia  Difficulty in walking, not elsewhere classified  Hemiplegia and hemiparesis following cerebral infarction affecting left non-dominant side (HCC)  Late effects of CVA  (cerebrovascular accident)  Cervicalgia  Abnormal posture  Rationale for Evaluation and Treatment: Rehabilitation  ONSET DATE: 03/29/23  SUBJECTIVE:   SUBJECTIVE STATEMENT: Patient reports that she is feeling better, a little stiff and sore with the cold  PERTINENT HISTORY: CVA, CHF, HTN, A-fib, TKA PAIN:  Are you having pain? Yes: NPRS scale: 6/10 Pain location: left upper trap and neck, left ankle and knee  Pain description: ache sore Aggravating factors: neck and shoulder always hurt, knee and ankle hurt with transfers Relieving factors: massage, heat  PRECAUTIONS: None  RED FLAGS: None   WEIGHT BEARING RESTRICTIONS: No  FALLS:  Has patient fallen in last 6 months? No  LIVING ENVIRONMENT: Lives with: lives alone Lives in: House/apartment Stairs: No Has following equipment at home: Environmental Consultant - 2 wheeled, Wheelchair (manual), shower chair, bed side commode, Grab bars, and Ramped entry  OCCUPATION: retired  PLOF: Needs assistance with homemaking, Needs assistance with transfers, and Leisure: plays bridge, she has 11 hours of an aide at home that helps with meals, going to MD's, dressing and bathing, is alone at night  PATIENT GOALS: walk, have less pain, transfer without difficulty  NEXT MD VISIT: none scheduled  OBJECTIVE:   DIAGNOSTIC FINDINGS: none performed  COGNITION: Overall cognitive status: Within functional limits for tasks assessed     SENSATION: WFL POSTURE: rounded shoulders and forward head  PALPATION: Left knee and ankle are swollen and tender, left upper and neck  LOWER EXTREMITY ROM:  Active ROM Right eval Left eval  Left 07/31/23 Left 08/16/23  Hip flexion       Hip extension       Hip abduction       Hip adduction       Hip internal rotation       Hip external rotation       Knee flexion  90 93 96 95  Knee extension  0 0 0 0  Ankle dorsiflexion  5     Ankle plantarflexion  30 44 49 52  Ankle inversion  5     Ankle eversion  0       (Blank rows = not tested)  LOWER EXTREMITY MMT:  MMT Right eval Left eval Left 06/21/23 Left 07/31/23 08/23/23 left  Hip flexion 4- 3+ 4- 4 4-  Hip extension       Hip abduction 4 3+ 4- 4+ 4  Hip adduction 4 3+ 4- 4 4-  Hip internal rotation       Hip external rotation       Knee flexion 4- 3+ 4 4 4+  Knee extension 4- 3+ 4- 4 4  Ankle dorsiflexion     4-  Ankle plantarflexion     4-  Ankle inversion       Ankle eversion        (Blank rows = not tested)  FUNCTIONAL TESTS:  Transfers Max A, left knee gives out and goes into ER and tends to bow into varus, has not walked in about 18  months, and the last time I saw her we could walk about 60 feet with HHA and w/c following  GAIT: Distance walked: unable   TODAY'S TREATMENT:                                                                                                                              DATE:  09/04/23 Nustp level 5 x 7 minutes 5# marches 5# LAQ Gait with HHA and w/c follow 2x 60 feet Red tand HA curls Red tband hip adduction and abduction left only Red tband ankle PF/DF STM to the left upper trap and into the neck Stnading weight shifts  08/30/23 Red tband ankle PF/DF Red tband hip adduction and abduction Red HS curls 5# LAQ 3# marches STM to the left upper trap and neck Nustep level 5 x 8 minutes Walking with HHA and w/c follow x 40 feet stopped due to left medial knee pain Standing weight shift trying to help with the knee pain  08/23/23 STM to the left upper trap and neck Nustep level 5 x 8 minutes less trunk lean and better control of the left leg today MMT and testing of transfers and gait If she does not have anything to pull up on , she cannot push up into standing from sitting needs Max A like this with PT in front holding on she min/modA 5# LAQ\ 5# Marches Red tband left hip abduction and adduction Gait with HHA and w/c follow 54 feet  08/21/23 Red tband left ankle PF/DF Red tband left  knee HS curls Red tband left hip abduction and adduction 5# left leg LAQ 5# marches Nustep level 5 x 8 minutes needing cues today due to right trunk lean and left knee falling out Standing weight shifts and postural corrections Transfers today were not good, tended to lock the left knee out and have the left hip back  08/16/23 Approximation to L hand digits STM to the left upper trap, L deltoid, and neck NuStep L 5 x 7 min 500 steps  Red tband ankle PF/DF Red tband HS curls green tband hip adduction  green tband hip abduction 5# marches 5# LAQ Gait with HHA and w/c follow 36' and 46', stops due to fatigue  08/14/23 Approximation to L hand digits STM to the left upper trap, L deltoid, and neck NuStep L 5 x 7 min 500 steps  Red tband ankle PF/DF Red tband HS curls Red tband hip adduction  Red tband hip abduction 5# marches 5# LAQ Gait with HHA and w/c follow 23' and 46', stops due to fatigue PATIENT EDUCATION:  Education details: POC Person educated: Patient Education method: Explanation Education comprehension: verbalized understanding  HOME EXERCISE PROGRAM: TBD  ASSESSMENT:  CLINICAL IMPRESSION: Pt did well today with 2x60 feet walking, she did have at the end of the 2nd walk some c/o left knee soreness but seemed to snap the knee back and the left hip looked to  be weak.  Still with a knot in the left upper trap and the neck this is due to the hemiplegia on this side and a synergy that happens and causes the left shoulder to elevate with other activities  OBJECTIVE IMPAIRMENTS: Abnormal gait, cardiopulmonary status limiting activity, decreased activity tolerance, decreased balance, decreased coordination, decreased endurance, decreased mobility, difficulty walking, decreased ROM, decreased strength, increased edema, impaired flexibility, improper body mechanics, postural dysfunction, and pain.   REHAB POTENTIAL: Good  CLINICAL DECISION MAKING:  Stable/uncomplicated  EVALUATION COMPLEXITY: Low   GOALS: Goals reviewed with patient? Yes  SHORT TERM GOALS: Target date: 06/02/23 Independent with advanced HEP Goal status: met 06/07/23  LONG TERM GOALS: Target date: 08/21/23  Independent with advanced HEP with caregiver Goal status: ongoing 08/23/23  2.  Transfer with set up and CGA Goal status: Ongoing 08/23/23 min/mod A  3.  Walk HHA x 100 feet Goal status: Progressing 08/23/23 54'  4.  Increase left LE strength to 4/5 Goal status: Met 07/31/23  5.  Report neck and shoulder pain decreased 50% Goal status: Progressing 30% 08/23/23  PLAN:  PT FREQUENCY: 1-2x/week  PT DURATION: 12 weeks  PLANNED INTERVENTIONS: Therapeutic exercises, Therapeutic activity, Neuromuscular re-education, Balance training, Gait training, Patient/Family education, Self Care, Joint mobilization, Moist heat, Taping, and Manual therapy  PLAN FOR NEXT SESSION:will work on her safe transfers and try to help with stabilization of the left knee, address goals   OBADIAH OZELL ORN, PT

## 2023-09-05 ENCOUNTER — Ambulatory Visit (INDEPENDENT_AMBULATORY_CARE_PROVIDER_SITE_OTHER): Payer: Medicare Other | Admitting: Podiatry

## 2023-09-05 ENCOUNTER — Encounter: Payer: Self-pay | Admitting: Podiatry

## 2023-09-05 VITALS — Ht <= 58 in | Wt 160.0 lb

## 2023-09-05 DIAGNOSIS — M79609 Pain in unspecified limb: Secondary | ICD-10-CM | POA: Diagnosis not present

## 2023-09-05 DIAGNOSIS — E118 Type 2 diabetes mellitus with unspecified complications: Secondary | ICD-10-CM | POA: Diagnosis not present

## 2023-09-05 DIAGNOSIS — B351 Tinea unguium: Secondary | ICD-10-CM | POA: Diagnosis not present

## 2023-09-05 NOTE — Progress Notes (Signed)
This patient returns to my office for at risk foot care.  This patient requires this care by a professional since this patient will be at risk due to having diabetes.  This patient is unable to cut nails herself since the patient cannot reach her nails.These nails are painful walking and wearing shoes. Patient has coagulation defect due to taking eliquis. Patient presents to the office with her caregiver and in a wheelchair. This patient presents for at risk foot care today.  General Appearance  Alert, conversant and in no acute stress.  Vascular  Dorsalis pedis and posterior tibial  pulses are palpable  bilaterally.  Capillary return is within normal limits  bilaterally. Temperature is within normal limits  bilaterally.  Neurologic  Senn-Weinstein monofilament wire test within normal limits  bilaterally. Muscle power within normal limits bilaterally.  Nails Thick disfigured discolored nails with subungual debris  from hallux to fifth toes bilaterally. No evidence of bacterial infection or drainage bilaterally.  Orthopedic  No limitations of motion  feet .  No crepitus or effusions noted.  No bony pathology or digital deformities noted.  Skin  normotropic skin with no porokeratosis noted bilaterally.  No signs of infections or ulcers noted.     Onychomycosis  Pain in right toes  Pain in left toes  Consent was obtained for treatment procedures.   Mechanical debridement of nails 1-5  bilaterally performed with a nail nipper.  Filed with dremel without incident.    Return office visit  4   months                   Told patient to return for periodic foot care and evaluation due to potential at risk complications.   Helane Gunther DPM

## 2023-09-06 ENCOUNTER — Encounter: Payer: Self-pay | Admitting: Physical Therapy

## 2023-09-06 ENCOUNTER — Ambulatory Visit: Payer: Medicare Other | Admitting: Physical Therapy

## 2023-09-06 DIAGNOSIS — I6990 Unspecified sequelae of unspecified cerebrovascular disease: Secondary | ICD-10-CM

## 2023-09-06 DIAGNOSIS — M25572 Pain in left ankle and joints of left foot: Secondary | ICD-10-CM | POA: Diagnosis not present

## 2023-09-06 DIAGNOSIS — M6281 Muscle weakness (generalized): Secondary | ICD-10-CM

## 2023-09-06 DIAGNOSIS — R270 Ataxia, unspecified: Secondary | ICD-10-CM | POA: Diagnosis not present

## 2023-09-06 DIAGNOSIS — M542 Cervicalgia: Secondary | ICD-10-CM

## 2023-09-06 DIAGNOSIS — R262 Difficulty in walking, not elsewhere classified: Secondary | ICD-10-CM

## 2023-09-06 DIAGNOSIS — M25512 Pain in left shoulder: Secondary | ICD-10-CM | POA: Diagnosis not present

## 2023-09-06 DIAGNOSIS — I69354 Hemiplegia and hemiparesis following cerebral infarction affecting left non-dominant side: Secondary | ICD-10-CM

## 2023-09-06 DIAGNOSIS — R293 Abnormal posture: Secondary | ICD-10-CM

## 2023-09-06 NOTE — Therapy (Signed)
 OUTPATIENT PHYSICAL THERAPY LOWER EXTREMITY TREATMENT  Progress Note Reporting Period 08/02/23 to 09/06/23 for visits 21-30  See note below for Objective Data and Assessment of Progress/Goals.     Patient Name: Karen Jackson MRN: [id number] DOB:[birth year], 83 y.o., female Today's Date: 09/06/2023  END OF SESSION:  PT End of Session - 09/06/23 1101     Visit Number 30    Date for PT Re-Evaluation 09/23/23    Authorization Type Medicare    PT Start Time 1058    PT Stop Time 1143    PT Time Calculation (min) 45 min    Activity Tolerance Patient tolerated treatment well    Behavior During Therapy [facility] for tasks assessed/performed             Past Medical History:  Diagnosis Date   Acute cystitis without hematuria    Acute diastolic CHF (congestive heart failure) (HCC)    Arthritis    Dyspnea    Dysrhythmia    Fever of unknown origin 03/19/2017   Hyperlipidemia    Hypertension    denies at preop   Multifocal pneumonia    Neuromuscular disorder (HCC)    neuropathy left arm and foot   Osteopenia    Paralysis (HCC)    partial left side from CVA    Persistent atrial fibrillation (HCC)    PONV (postoperative nausea and vomiting)    Pre-diabetes    Stroke [facility]) 2013   hemmorahgic   Past Surgical History:  Procedure Laterality Date   ANKLE SURGERY     APPENDECTOMY     CHOLECYSTECTOMY     HARDWARE REMOVAL Left 03/29/2023   Procedure: HARDWARE REMOVAL;  Surgeon: Kit Rush, MD;  Location: [facility];  Service: Orthopedics;  Laterality: Left;   HERNIA REPAIR     Esophagus   INCISION AND DRAINAGE OF WOUND Left 03/29/2023   Procedure: LEFT ANKLE WOUND IRRIGATION AND DEBRIDEMENT WOUND;  Surgeon: Kit Rush, MD;  Location: [facility];  Service: Orthopedics;  Laterality: Left;   JOINT REPLACEMENT     total- right partial- left   MASTECTOMY PARTIAL / LUMPECTOMY  2012   left   ORIF ANKLE FRACTURE Left 07/20/2018   Procedure: OPEN REDUCTION  INTERNAL FIXATION (ORIF) ANKLE FRACTURE;  Surgeon: Kit Rush, MD;  Location: [facility];  Service: Orthopedics;  Laterality: Left;   TOTAL KNEE ARTHROPLASTY Left 01/27/2019   Procedure: TOTAL KNEE ARTHROPLASTY;  Surgeon: Rubie Kemps, MD;  Location: [facility];  Service: Orthopedics;  Laterality: Left;   Patient Active Problem List   Diagnosis Date Noted   Goals of care, counseling/discussion 01/17/2022   Grief 01/17/2022   Secondary hypercoagulable state (HCC) 03/16/2021   Dizziness 03/10/2021   Orthostatic hypotension 10/15/2020   Presbycusis of both ears 03/08/2020   Mixed stress and urge urinary incontinence 12/02/2019   Macrocytosis 12/01/2019   Nutritional anemia 12/01/2019   S/P total knee replacement 01/27/2019   Recurrent left knee instability 07/05/2018   Respiratory failure with hypoxia (HCC) 08/30/2017   Hypoxemia    Heart failure with preserved ejection fraction (HCC), Grade 3 diastolic dysfunction 03/26/2017   PAF (paroxysmal atrial fibrillation) (HCC)    Dyspnea 03/19/2017   Encounter for preventive health examination 02/17/2016   Sensorineural hearing loss (SNHL), bilateral 01/26/2016   Hypomagnesemia 04/24/2014   Hemiparesis affecting left side as late effect of cerebrovascular accident (HCC) 04/24/2014   Nontraumatic cerebral hemorrhage (HCC) 04/30/2012   DM (diabetes mellitus) with complications (HCC) 03/04/2010   OSTEOPENIA  01/21/2009   UNSPECIFIED VITAMIN D  DEFICIENCY 11/19/2007   HYPERCHOLESTEROLEMIA 10/25/2006   GASTROESOPHAGEAL REFLUX, NO ESOPHAGITIS 10/25/2006   DIVERTICULOSIS OF COLON 10/25/2006   Osteoarthritis 10/25/2006   CERVICAL SPINE DISORDER, NOS 10/25/2006    PCP: Shayne, MD  REFERRING PROVIDER: Shayne, MD  REFERRING DIAG: s/p left ankle ORIF hardware removal  THERAPY DIAG:  Muscle weakness (generalized)  Left shoulder pain, unspecified chronicity  Pain in left ankle and joints of left foot  Ataxia  Difficulty in walking, not elsewhere  classified  Hemiplegia and hemiparesis following cerebral infarction affecting left non-dominant side (HCC)  Late effects of CVA (cerebrovascular accident)  Cervicalgia  Abnormal posture  Rationale for Evaluation and Treatment: Rehabilitation  ONSET DATE: 03/29/23  SUBJECTIVE:   SUBJECTIVE STATEMENT: Patient reports that she is feeling better, a little stiff and sore with the cold, reports knee and ankle are very sore, the shoulders are really hurting  PERTINENT HISTORY: CVA, CHF, HTN, A-fib, TKA PAIN:  Are you having pain? Yes: NPRS scale: 6/10 Pain location: left upper trap and neck, left ankle and knee  Pain description: ache sore Aggravating factors: neck and shoulder always hurt, knee and ankle hurt with transfers Relieving factors: massage, heat  PRECAUTIONS: None  RED FLAGS: None   WEIGHT BEARING RESTRICTIONS: No  FALLS:  Has patient fallen in last 6 months? No  LIVING ENVIRONMENT: Lives with: lives alone Lives in: House/apartment Stairs: No Has following equipment at home: Environmental Consultant - 2 wheeled, Wheelchair (manual), shower chair, bed side commode, Grab bars, and Ramped entry  OCCUPATION: retired  PLOF: Needs assistance with homemaking, Needs assistance with transfers, and Leisure: plays bridge, she has 11 hours of an aide at home that helps with meals, going to MD's, dressing and bathing, is alone at night  PATIENT GOALS: walk, have less pain, transfer without difficulty  NEXT MD VISIT: none scheduled  OBJECTIVE:   DIAGNOSTIC FINDINGS: none performed  COGNITION: Overall cognitive status: Within functional limits for tasks assessed     SENSATION: WFL POSTURE: rounded shoulders and forward head  PALPATION: Left knee and ankle are swollen and tender, left upper and neck  LOWER EXTREMITY ROM:  Active ROM Right eval Left eval  Left 07/31/23 Left 08/16/23  Hip flexion       Hip extension       Hip abduction       Hip adduction       Hip internal  rotation       Hip external rotation       Knee flexion  90 93 96 95  Knee extension  0 0 0 0  Ankle dorsiflexion  5     Ankle plantarflexion  30 44 49 52  Ankle inversion  5     Ankle eversion  0      (Blank rows = not tested)  LOWER EXTREMITY MMT:  MMT Right eval Left eval Left 06/21/23 Left 07/31/23 08/23/23 left  Hip flexion 4- 3+ 4- 4 4-  Hip extension       Hip abduction 4 3+ 4- 4+ 4  Hip adduction 4 3+ 4- 4 4-  Hip internal rotation       Hip external rotation       Knee flexion 4- 3+ 4 4 4+  Knee extension 4- 3+ 4- 4 4  Ankle dorsiflexion     4-  Ankle plantarflexion     4-  Ankle inversion       Ankle eversion        (  Blank rows = not tested)  FUNCTIONAL TESTS:  Transfers Max A, left knee gives out and goes into ER and tends to bow into varus, has not walked in about 18 months, and the last time I saw her we could walk about 60 feet with HHA and w/c following  GAIT: Distance walked: unable   TODAY'S TREATMENT:                                                                                                                              DATE:  09/06/23 Nustep level 5 x 8 minutes STM with t-gun to the upper traps and neck Gait with HHA and w/c follow x20 feet stopped due to left knee pain Standing weight shifts and had her bend and flex the knee multiple times, rest and repeat Then was able to walk with less pain x 45 feet  5# LAQ Red tband PF/DF Red tband HS curl Red tband hip adduction  09/04/23 Nustp level 5 x 7 minutes 5# marches 5# LAQ Gait with HHA and w/c follow 2x 60 feet Red tand HA curls Red tband hip adduction and abduction left only Red tband ankle PF/DF STM to the left upper trap and into the neck Stnading weight shifts  08/30/23 Red tband ankle PF/DF Red tband hip adduction and abduction Red HS curls 5# LAQ 3# marches STM to the left upper trap and neck Nustep level 5 x 8 minutes Walking with HHA and w/c follow x 40 feet stopped due to  left medial knee pain Standing weight shift trying to help with the knee pain  08/23/23 STM to the left upper trap and neck Nustep level 5 x 8 minutes less trunk lean and better control of the left leg today MMT and testing of transfers and gait If she does not have anything to pull up on , she cannot push up into standing from sitting needs Max A like this with PT in front holding on she min/modA 5# LAQ\ 5# Marches Red tband left hip abduction and adduction Gait with HHA and w/c follow 54 feet  08/21/23 Red tband left ankle PF/DF Red tband left knee HS curls Red tband left hip abduction and adduction 5# left leg LAQ 5# marches Nustep level 5 x 8 minutes needing cues today due to right trunk lean and left knee falling out Standing weight shifts and postural corrections Transfers today were not good, tended to lock the left knee out and have the left hip back  08/16/23 Approximation to L hand digits STM to the left upper trap, L deltoid, and neck NuStep L 5 x 7 min 500 steps  Red tband ankle PF/DF Red tband HS curls green tband hip adduction  green tband hip abduction 5# marches 5# LAQ Gait with HHA and w/c follow 63' and 46', stops due to fatigue  08/14/23 Approximation to L hand digits STM to the left upper trap, L deltoid, and neck NuStep  L 5 x 7 min 500 steps  Red tband ankle PF/DF Red tband HS curls Red tband hip adduction  Red tband hip abduction 5# marches 5# LAQ Gait with HHA and w/c follow 41' and 46', stops due to fatigue PATIENT EDUCATION:  Education details: POC Person educated: Patient Education method: Explanation Education comprehension: verbalized understanding  HOME EXERCISE PROGRAM: TBD  ASSESSMENT:  CLINICAL IMPRESSION: With very cold weather today c/o much worse pain I the upper traps and neck, she also then had difficulty walking due to left knee pain, able to resolve this some with standing weight shifts and some bend knee and then  perform TKE. Still with a knot in the left upper trap and the neck this is due to the hemiplegia on this side and a synergy that happens and causes the left shoulder to elevate with other activities  OBJECTIVE IMPAIRMENTS: Abnormal gait, cardiopulmonary status limiting activity, decreased activity tolerance, decreased balance, decreased coordination, decreased endurance, decreased mobility, difficulty walking, decreased ROM, decreased strength, increased edema, impaired flexibility, improper body mechanics, postural dysfunction, and pain.   REHAB POTENTIAL: Good  CLINICAL DECISION MAKING: Stable/uncomplicated  EVALUATION COMPLEXITY: Low   GOALS: Goals reviewed with patient? Yes  SHORT TERM GOALS: Target date: 06/02/23 Independent with advanced HEP Goal status: met 06/07/23  LONG TERM GOALS: Target date: 08/21/23  Independent with advanced HEP with caregiver Goal status: ongoing 09/06/23  2.  Transfer with set up and CGA Goal status: Ongoing 09/06/23 min/mod A  3.  Walk HHA x 100 feet Goal status: Progressing 08/23/23 54'  4.  Increase left LE strength to 4/5 Goal status: Met 07/31/23  5.  Report neck and shoulder pain decreased 50% Goal status: Progressing 30% 09/06/23  PLAN:  PT FREQUENCY: 1-2x/week  PT DURATION: 12 weeks  PLANNED INTERVENTIONS: Therapeutic exercises, Therapeutic activity, Neuromuscular re-education, Balance training, Gait training, Patient/Family education, Self Care, Joint mobilization, Moist heat, Taping, and Manual therapy  PLAN FOR NEXT SESSION:will work on her safe transfers and try to help with stabilization of the left knee   OBADIAH OZELL ORN, PT

## 2023-09-11 ENCOUNTER — Encounter: Payer: Self-pay | Admitting: Physical Therapy

## 2023-09-11 ENCOUNTER — Ambulatory Visit: Payer: Medicare Other | Admitting: Physical Therapy

## 2023-09-11 DIAGNOSIS — M25572 Pain in left ankle and joints of left foot: Secondary | ICD-10-CM | POA: Diagnosis not present

## 2023-09-11 DIAGNOSIS — M25512 Pain in left shoulder: Secondary | ICD-10-CM | POA: Diagnosis not present

## 2023-09-11 DIAGNOSIS — R270 Ataxia, unspecified: Secondary | ICD-10-CM | POA: Diagnosis not present

## 2023-09-11 DIAGNOSIS — R262 Difficulty in walking, not elsewhere classified: Secondary | ICD-10-CM | POA: Diagnosis not present

## 2023-09-11 DIAGNOSIS — M6281 Muscle weakness (generalized): Secondary | ICD-10-CM

## 2023-09-11 DIAGNOSIS — I69354 Hemiplegia and hemiparesis following cerebral infarction affecting left non-dominant side: Secondary | ICD-10-CM | POA: Diagnosis not present

## 2023-09-11 NOTE — Therapy (Signed)
 OUTPATIENT PHYSICAL THERAPY LOWER EXTREMITY TREATMENT     Patient Name: Karen Jackson MRN: [id number] DOB:23-May-1941, 83 y.o., female Today's Date: 09/11/2023  END OF SESSION:  PT End of Session - 09/11/23 1058     Visit Number 31    Date for PT Re-Evaluation 09/23/23    PT Start Time 1058    PT Stop Time 1145    PT Time Calculation (min) 47 min    Activity Tolerance Patient tolerated treatment well    Behavior During Therapy [facility] for tasks assessed/performed             Past Medical History:  Diagnosis Date   Acute cystitis without hematuria    Acute diastolic CHF (congestive heart failure) (HCC)    Arthritis    Dyspnea    Dysrhythmia    Fever of unknown origin 03/19/2017   Hyperlipidemia    Hypertension    denies at preop   Multifocal pneumonia    Neuromuscular disorder (HCC)    neuropathy left arm and foot   Osteopenia    Paralysis (HCC)    partial left side from CVA    Persistent atrial fibrillation (HCC)    PONV (postoperative nausea and vomiting)    Pre-diabetes    Stroke [facility]) 2013   hemmorahgic   Past Surgical History:  Procedure Laterality Date   ANKLE SURGERY     APPENDECTOMY     CHOLECYSTECTOMY     HARDWARE REMOVAL Left 03/29/2023   Procedure: HARDWARE REMOVAL;  Surgeon: Kit Rush, MD;  Location: [facility];  Service: Orthopedics;  Laterality: Left;   HERNIA REPAIR     Esophagus   INCISION AND DRAINAGE OF WOUND Left 03/29/2023   Procedure: LEFT ANKLE WOUND IRRIGATION AND DEBRIDEMENT WOUND;  Surgeon: Kit Rush, MD;  Location: [facility];  Service: Orthopedics;  Laterality: Left;   JOINT REPLACEMENT     total- right partial- left   MASTECTOMY PARTIAL / LUMPECTOMY  2012   left   ORIF ANKLE FRACTURE Left 07/20/2018   Procedure: OPEN REDUCTION INTERNAL FIXATION (ORIF) ANKLE FRACTURE;  Surgeon: Kit Rush, MD;  Location: [facility];  Service: Orthopedics;  Laterality: Left;   TOTAL KNEE ARTHROPLASTY Left 01/27/2019    Procedure: TOTAL KNEE ARTHROPLASTY;  Surgeon: Rubie Kemps, MD;  Location: [facility];  Service: Orthopedics;  Laterality: Left;   Patient Active Problem List   Diagnosis Date Noted   Goals of care, counseling/discussion 01/17/2022   Grief 01/17/2022   Secondary hypercoagulable state (HCC) 03/16/2021   Dizziness 03/10/2021   Orthostatic hypotension 10/15/2020   Presbycusis of both ears 03/08/2020   Mixed stress and urge urinary incontinence 12/02/2019   Macrocytosis 12/01/2019   Nutritional anemia 12/01/2019   S/P total knee replacement 01/27/2019   Recurrent left knee instability 07/05/2018   Respiratory failure with hypoxia (HCC) 08/30/2017   Hypoxemia    Heart failure with preserved ejection fraction (HCC), Grade 3 diastolic dysfunction 03/26/2017   PAF (paroxysmal atrial fibrillation) (HCC)    Dyspnea 03/19/2017   Encounter for preventive health examination 02/17/2016   Sensorineural hearing loss (SNHL), bilateral 01/26/2016   Hypomagnesemia 04/24/2014   Hemiparesis affecting left side as late effect of cerebrovascular accident (HCC) 04/24/2014   Nontraumatic cerebral hemorrhage (HCC) 04/30/2012   DM (diabetes mellitus) with complications (HCC) 03/04/2010   OSTEOPENIA 01/21/2009   UNSPECIFIED VITAMIN D  DEFICIENCY 11/19/2007   HYPERCHOLESTEROLEMIA 10/25/2006   GASTROESOPHAGEAL REFLUX, NO ESOPHAGITIS 10/25/2006   DIVERTICULOSIS OF COLON 10/25/2006   Osteoarthritis  10/25/2006   CERVICAL SPINE DISORDER, NOS 10/25/2006    PCP: Shayne, MD  REFERRING PROVIDER: Shayne, MD  REFERRING DIAG: s/p left ankle ORIF hardware removal  THERAPY DIAG:  Muscle weakness (generalized)  Left shoulder pain, unspecified chronicity  Pain in left ankle and joints of left foot  Ataxia  Difficulty in walking, not elsewhere classified  Rationale for Evaluation and Treatment: Rehabilitation  ONSET DATE: 03/29/23  SUBJECTIVE:   SUBJECTIVE STATEMENT: 'So, So  PERTINENT HISTORY: CVA, CHF,  HTN, A-fib, TKA PAIN:  Are you having pain? Yes: NPRS scale: 6/10 Pain location: left upper trap and neck, left ankle and knee  Pain description: ache sore Aggravating factors: neck and shoulder always hurt, knee and ankle hurt with transfers Relieving factors: massage, heat  PRECAUTIONS: None  RED FLAGS: None   WEIGHT BEARING RESTRICTIONS: No  FALLS:  Has patient fallen in last 6 months? No  LIVING ENVIRONMENT: Lives with: lives alone Lives in: House/apartment Stairs: No Has following equipment at home: Environmental Consultant - 2 wheeled, Wheelchair (manual), shower chair, bed side commode, Grab bars, and Ramped entry  OCCUPATION: retired  PLOF: Needs assistance with homemaking, Needs assistance with transfers, and Leisure: plays bridge, she has 11 hours of an aide at home that helps with meals, going to MD's, dressing and bathing, is alone at night  PATIENT GOALS: walk, have less pain, transfer without difficulty  NEXT MD VISIT: none scheduled  OBJECTIVE:   DIAGNOSTIC FINDINGS: none performed  COGNITION: Overall cognitive status: Within functional limits for tasks assessed     SENSATION: WFL POSTURE: rounded shoulders and forward head  PALPATION: Left knee and ankle are swollen and tender, left upper and neck  LOWER EXTREMITY ROM:  Active ROM Right eval Left eval  Left 07/31/23 Left 08/16/23  Hip flexion       Hip extension       Hip abduction       Hip adduction       Hip internal rotation       Hip external rotation       Knee flexion  90 93 96 95  Knee extension  0 0 0 0  Ankle dorsiflexion  5     Ankle plantarflexion  30 44 49 52  Ankle inversion  5     Ankle eversion  0      (Blank rows = not tested)  LOWER EXTREMITY MMT:  MMT Right eval Left eval Left 06/21/23 Left 07/31/23 08/23/23 left  Hip flexion 4- 3+ 4- 4 4-  Hip extension       Hip abduction 4 3+ 4- 4+ 4  Hip adduction 4 3+ 4- 4 4-  Hip internal rotation       Hip external rotation       Knee  flexion 4- 3+ 4 4 4+  Knee extension 4- 3+ 4- 4 4  Ankle dorsiflexion     4-  Ankle plantarflexion     4-  Ankle inversion       Ankle eversion        (Blank rows = not tested)  FUNCTIONAL TESTS:  Transfers Max A, left knee gives out and goes into ER and tends to bow into varus, has not walked in about 18 months, and the last time I saw her we could walk about 60 feet with HHA and w/c following  GAIT: Distance walked: unable   TODAY'S TREATMENT:  DATE:  09/11/23 NuStep L5 x 6 min STM with t-gun to the upper traps and neck Gait with HHA and w/c follow 78' and 46', stops due to fatigue 5# LAQ Red tband PF/DF Red tband HS curl Red tband hip adduction Approximation to L digits  09/06/23 Nustep level 5 x 8 minutes STM with t-gun to the upper traps and neck Gait with HHA and w/c follow x20 feet stopped due to left knee pain Standing weight shifts and had her bend and flex the knee multiple times, rest and repeat Then was able to walk with less pain x 45 feet  5# LAQ Red tband PF/DF Red tband HS curl Red tband hip adduction  09/04/23 Nustp level 5 x 7 minutes 5# marches 5# LAQ Gait with HHA and w/c follow 2x 60 feet Red tand HA curls Red tband hip adduction and abduction left only Red tband ankle PF/DF STM to the left upper trap and into the neck Stnading weight shifts  08/30/23 Red tband ankle PF/DF Red tband hip adduction and abduction Red HS curls 5# LAQ 3# marches STM to the left upper trap and neck Nustep level 5 x 8 minutes Walking with HHA and w/c follow x 40 feet stopped due to left medial knee pain Standing weight shift trying to help with the knee pain  08/23/23 STM to the left upper trap and neck Nustep level 5 x 8 minutes less trunk lean and better control of the left leg today MMT and testing of transfers and gait If she does not  have anything to pull up on , she cannot push up into standing from sitting needs Max A like this with PT in front holding on she min/modA 5# LAQ\ 5# Marches Red tband left hip abduction and adduction Gait with HHA and w/c follow 54 feet  08/21/23 Red tband left ankle PF/DF Red tband left knee HS curls Red tband left hip abduction and adduction 5# left leg LAQ 5# marches Nustep level 5 x 8 minutes needing cues today due to right trunk lean and left knee falling out Standing weight shifts and postural corrections Transfers today were not good, tended to lock the left knee out and have the left hip back  08/16/23 Approximation to L hand digits STM to the left upper trap, L deltoid, and neck NuStep L 5 x 7 min 500 steps  Red tband ankle PF/DF Red tband HS curls green tband hip adduction  green tband hip abduction 5# marches 5# LAQ Gait with HHA and w/c follow 39' and 46', stops due to fatigue  08/14/23 Approximation to L hand digits STM to the left upper trap, L deltoid, and neck NuStep L 5 x 7 min 500 steps  Red tband ankle PF/DF Red tband HS curls Red tband hip adduction  Red tband hip abduction 5# marches 5# LAQ Gait with HHA and w/c follow 32' and 46', stops due to fatigue PATIENT EDUCATION:  Education details: POC Person educated: Patient Education method: Explanation Education comprehension: verbalized understanding  HOME EXERCISE PROGRAM: TBD  ASSESSMENT:  CLINICAL IMPRESSION: Pt enters feeling so, so. Still with a knot in the left upper trap and the neck this is due to the hemiplegia on this side and a synergy that happens and causes the left shoulder to elevate with other activities. Pt able to improve from previous session wit her gait tolerance ambulating increasing her distance. She stated that she felt better after moving because she has not been out of  the house since Friday. Pt is still unsafe at times with transfers tending to sit prematurely despite  cues.  OBJECTIVE IMPAIRMENTS: Abnormal gait, cardiopulmonary status limiting activity, decreased activity tolerance, decreased balance, decreased coordination, decreased endurance, decreased mobility, difficulty walking, decreased ROM, decreased strength, increased edema, impaired flexibility, improper body mechanics, postural dysfunction, and pain.   REHAB POTENTIAL: Good  CLINICAL DECISION MAKING: Stable/uncomplicated  EVALUATION COMPLEXITY: Low   GOALS: Goals reviewed with patient? Yes  SHORT TERM GOALS: Target date: 06/02/23 Independent with advanced HEP Goal status: met 06/07/23  LONG TERM GOALS: Target date: 08/21/23  Independent with advanced HEP with caregiver Goal status: ongoing 09/06/23  2.  Transfer with set up and CGA Goal status: Ongoing 09/06/23 min/mod A  3.  Walk HHA x 100 feet Goal status: Progressing 08/23/23 54'  4.  Increase left LE strength to 4/5 Goal status: Met 07/31/23  5.  Report neck and shoulder pain decreased 50% Goal status: Progressing 30% 09/06/23  PLAN:  PT FREQUENCY: 1-2x/week  PT DURATION: 12 weeks  PLANNED INTERVENTIONS: Therapeutic exercises, Therapeutic activity, Neuromuscular re-education, Balance training, Gait training, Patient/Family education, Self Care, Joint mobilization, Moist heat, Taping, and Manual therapy  PLAN FOR NEXT SESSION:will work on her safe transfers and try to help with stabilization of the left knee   Tanda KANDICE Sorrow, PTA

## 2023-09-12 DIAGNOSIS — Z85828 Personal history of other malignant neoplasm of skin: Secondary | ICD-10-CM | POA: Diagnosis not present

## 2023-09-12 DIAGNOSIS — L304 Erythema intertrigo: Secondary | ICD-10-CM | POA: Diagnosis not present

## 2023-09-12 DIAGNOSIS — L720 Epidermal cyst: Secondary | ICD-10-CM | POA: Diagnosis not present

## 2023-09-13 ENCOUNTER — Encounter: Payer: Self-pay | Admitting: Physical Therapy

## 2023-09-13 ENCOUNTER — Ambulatory Visit: Payer: Medicare Other | Admitting: Physical Therapy

## 2023-09-13 DIAGNOSIS — M25512 Pain in left shoulder: Secondary | ICD-10-CM | POA: Diagnosis not present

## 2023-09-13 DIAGNOSIS — M25572 Pain in left ankle and joints of left foot: Secondary | ICD-10-CM

## 2023-09-13 DIAGNOSIS — I69354 Hemiplegia and hemiparesis following cerebral infarction affecting left non-dominant side: Secondary | ICD-10-CM | POA: Diagnosis not present

## 2023-09-13 DIAGNOSIS — R262 Difficulty in walking, not elsewhere classified: Secondary | ICD-10-CM | POA: Diagnosis not present

## 2023-09-13 DIAGNOSIS — R270 Ataxia, unspecified: Secondary | ICD-10-CM | POA: Diagnosis not present

## 2023-09-13 DIAGNOSIS — M6281 Muscle weakness (generalized): Secondary | ICD-10-CM

## 2023-09-13 NOTE — Therapy (Addendum)
OUTPATIENT PHYSICAL THERAPY LOWER EXTREMITY TREATMENT     Patient Name: Karen Jackson MRN: [id number] DOB:[birth year], 83 y.o., female Today's Date: 09/13/2023  END OF SESSION:  PT End of Session - 09/13/23 1102     Visit Number 32    Date for PT Re-Evaluation 09/23/23    PT Start Time 1103    PT Stop Time 1145    PT Time Calculation (min) 42 min    Activity Tolerance Patient tolerated treatment well    Behavior During Therapy [facility] for tasks assessed/performed             Past Medical History:  Diagnosis Date   Acute cystitis without hematuria    Acute diastolic CHF (congestive heart failure) (HCC)    Arthritis    Dyspnea    Dysrhythmia    Fever of unknown origin 03/19/2017   Hyperlipidemia    Hypertension    denies at preop   Multifocal pneumonia    Neuromuscular disorder (HCC)    neuropathy left arm and foot   Osteopenia    Paralysis (HCC)    partial left side from CVA    Persistent atrial fibrillation (HCC)    PONV (postoperative nausea and vomiting)    Pre-diabetes    Stroke [facility]) 2013   hemmorahgic   Past Surgical History:  Procedure Laterality Date   ANKLE SURGERY     APPENDECTOMY     CHOLECYSTECTOMY     HARDWARE REMOVAL Left 03/29/2023   Procedure: HARDWARE REMOVAL;  Surgeon: Toni Arthurs, MD;  Location: [facility];  Service: Orthopedics;  Laterality: Left;   HERNIA REPAIR     Esophagus   INCISION AND DRAINAGE OF WOUND Left 03/29/2023   Procedure: LEFT ANKLE WOUND IRRIGATION AND DEBRIDEMENT WOUND;  Surgeon: Toni Arthurs, MD;  Location: [facility];  Service: Orthopedics;  Laterality: Left;   JOINT REPLACEMENT     total- right partial- left   MASTECTOMY PARTIAL / LUMPECTOMY  2012   left   ORIF ANKLE FRACTURE Left 07/20/2018   Procedure: OPEN REDUCTION INTERNAL FIXATION (ORIF) ANKLE FRACTURE;  Surgeon: Toni Arthurs, MD;  Location: [facility];  Service: Orthopedics;  Laterality: Left;   TOTAL KNEE ARTHROPLASTY Left 01/27/2019    Procedure: TOTAL KNEE ARTHROPLASTY;  Surgeon: Dannielle Huh, MD;  Location: [facility];  Service: Orthopedics;  Laterality: Left;   Patient Active Problem List   Diagnosis Date Noted   Goals of care, counseling/discussion 01/17/2022   Grief 01/17/2022   Secondary hypercoagulable state (HCC) 03/16/2021   Dizziness 03/10/2021   Orthostatic hypotension 10/15/2020   Presbycusis of both ears 03/08/2020   Mixed stress and urge urinary incontinence 12/02/2019   Macrocytosis 12/01/2019   Nutritional anemia 12/01/2019   S/P total knee replacement 01/27/2019   Recurrent left knee instability 07/05/2018   Respiratory failure with hypoxia (HCC) 08/30/2017   Hypoxemia    Heart failure with preserved ejection fraction (HCC), Grade 3 diastolic dysfunction 03/26/2017   PAF (paroxysmal atrial fibrillation) (HCC)    Dyspnea 03/19/2017   Encounter for preventive health examination 02/17/2016   Sensorineural hearing loss (SNHL), bilateral 01/26/2016   Hypomagnesemia 04/24/2014   Hemiparesis affecting left side as late effect of cerebrovascular accident (HCC) 04/24/2014   Nontraumatic cerebral hemorrhage (HCC) 04/30/2012   DM (diabetes mellitus) with complications (HCC) 03/04/2010   OSTEOPENIA 01/21/2009   UNSPECIFIED VITAMIN D DEFICIENCY 11/19/2007   HYPERCHOLESTEROLEMIA 10/25/2006   GASTROESOPHAGEAL REFLUX, NO ESOPHAGITIS 10/25/2006   DIVERTICULOSIS OF COLON 10/25/2006   Osteoarthritis  10/25/2006   CERVICAL SPINE DISORDER, NOS 10/25/2006    PCP: Waynard Edwards, MD  REFERRING PROVIDER: Waynard Edwards, MD  REFERRING DIAG: s/p left ankle ORIF hardware removal  THERAPY DIAG:  Muscle weakness (generalized)  Left shoulder pain, unspecified chronicity  Pain in left ankle and joints of left foot  Difficulty in walking, not elsewhere classified  Rationale for Evaluation and Treatment: Rehabilitation  ONSET DATE: 03/29/23  SUBJECTIVE:   SUBJECTIVE STATEMENT: Shoulder and knee is sore  PERTINENT HISTORY: CVA,  CHF, HTN, A-fib, TKA PAIN:  Are you having pain? Yes: NPRS scale: 6/10 Pain location: left upper trap and neck, left ankle and knee  Pain description: ache sore Aggravating factors: neck and shoulder always hurt, knee and ankle hurt with transfers Relieving factors: massage, heat  PRECAUTIONS: None  RED FLAGS: None   WEIGHT BEARING RESTRICTIONS: No  FALLS:  Has patient fallen in last 6 months? No  LIVING ENVIRONMENT: Lives with: lives alone Lives in: House/apartment Stairs: No Has following equipment at home: Environmental consultant - 2 wheeled, Wheelchair (manual), shower chair, bed side commode, Grab bars, and Ramped entry  OCCUPATION: retired  PLOF: Needs assistance with homemaking, Needs assistance with transfers, and Leisure: plays bridge, she has 11 hours of an aide at home that helps with meals, going to MD's, dressing and bathing, is alone at night  PATIENT GOALS: walk, have less pain, transfer without difficulty  NEXT MD VISIT: none scheduled  OBJECTIVE:   DIAGNOSTIC FINDINGS: none performed  COGNITION: Overall cognitive status: Within functional limits for tasks assessed     SENSATION: WFL POSTURE: rounded shoulders and forward head  PALPATION: Left knee and ankle are swollen and tender, left upper and neck  LOWER EXTREMITY ROM:  Active ROM Right eval Left eval  Left 07/31/23 Left 08/16/23  Hip flexion       Hip extension       Hip abduction       Hip adduction       Hip internal rotation       Hip external rotation       Knee flexion  90 93 96 95  Knee extension  0 0 0 0  Ankle dorsiflexion  5     Ankle plantarflexion  30 44 49 52  Ankle inversion  5     Ankle eversion  0      (Blank rows = not tested)  LOWER EXTREMITY MMT:  MMT Right eval Left eval Left 06/21/23 Left 07/31/23 08/23/23 left  Hip flexion 4- 3+ 4- 4 4-  Hip extension       Hip abduction 4 3+ 4- 4+ 4  Hip adduction 4 3+ 4- 4 4-  Hip internal rotation       Hip external rotation        Knee flexion 4- 3+ 4 4 4+  Knee extension 4- 3+ 4- 4 4  Ankle dorsiflexion     4-  Ankle plantarflexion     4-  Ankle inversion       Ankle eversion        (Blank rows = not tested)  FUNCTIONAL TESTS:  Transfers Max A, left knee gives out and goes into ER and tends to bow into varus, has not walked in about 18 months, and the last time I saw her we could walk about 60 feet with HHA and w/c following  GAIT: Distance walked: unable   TODAY'S TREATMENT:  DATE:  09/12/22 NuStep L5 x 7:39 min 500 steps  STM with t-gun to the upper traps and neck Red tband hip adduction Hip Abd LLE red 2x15  Red tband HS curl LLE  Red tband PF/DF LLE 5lb march LLE LAQ 5lb 2x10 Approximation to L digits Gait with HHA and w/c follow 55', stops due to fatigue  09/11/23 NuStep L5 x 6 min STM with t-gun to the upper traps and neck Gait with HHA and w/c follow 63' and 46', stops due to fatigue 5# LAQ Red tband PF/DF Red tband HS curl Red tband hip adduction Approximation to L digits  09/06/23 Nustep level 5 x 8 minutes STM with t-gun to the upper traps and neck Gait with HHA and w/c follow x20 feet stopped due to left knee pain Standing weight shifts and had her bend and flex the knee multiple times, rest and repeat Then was able to walk with less pain x 45 feet  5# LAQ Red tband PF/DF Red tband HS curl Red tband hip adduction  09/04/23 Nustp level 5 x 7 minutes 5# marches 5# LAQ Gait with HHA and w/c follow 2x 60 feet Red tand HA curls Red tband hip adduction and abduction left only Red tband ankle PF/DF STM to the left upper trap and into the neck Stnading weight shifts  08/30/23 Red tband ankle PF/DF Red tband hip adduction and abduction Red HS curls 5# LAQ 3# marches STM to the left upper trap and neck Nustep level 5 x 8 minutes Walking with HHA and w/c  follow x 40 feet stopped due to left medial knee pain Standing weight shift trying to help with the knee pain  08/23/23 STM to the left upper trap and neck Nustep level 5 x 8 minutes less trunk lean and better control of the left leg today MMT and testing of transfers and gait If she does not have anything to pull up on , she cannot push up into standing from sitting needs Max A like this with PT in front holding on she min/modA 5# LAQ\ 5# Marches Red tband left hip abduction and adduction Gait with HHA and w/c follow 54 feet   PATIENT EDUCATION:  Education details: POC Person educated: Patient Education method: Explanation Education comprehension: verbalized understanding  HOME EXERCISE PROGRAM: TBD  ASSESSMENT:  CLINICAL IMPRESSION: Pt enters with reports of shoulder ans knee soreness. Still with a knot in the left upper trap and the neck this is due to the hemiplegia on this side and a synergy that happens and causes the left shoulder to elevate with other activities. Only ne gait trial attempted today due to increase fatigue. Cue for full ROM needed with leg curl and extension.  Pt continues to be unsafe at times with transfers tending to sit prematurely despite cues.  OBJECTIVE IMPAIRMENTS: Abnormal gait, cardiopulmonary status limiting activity, decreased activity tolerance, decreased balance, decreased coordination, decreased endurance, decreased mobility, difficulty walking, decreased ROM, decreased strength, increased edema, impaired flexibility, improper body mechanics, postural dysfunction, and pain.   REHAB POTENTIAL: Good  CLINICAL DECISION MAKING: Stable/uncomplicated  EVALUATION COMPLEXITY: Low   GOALS: Goals reviewed with patient? Yes  SHORT TERM GOALS: Target date: 06/02/23 Independent with advanced HEP Goal status: met 06/07/23  LONG TERM GOALS: Target date: 08/21/23  Independent with advanced HEP with caregiver Goal status: ongoing 09/06/23  2.   Transfer with set up and CGA Goal status: Ongoing 09/06/23 min/mod A  3.  Walk HHA x 100 feet Goal  status: Progressing 08/23/23 54'  4.  Increase left LE strength to 4/5 Goal status: Met 07/31/23  5.  Report neck and shoulder pain decreased 50% Goal status: Progressing 30% 09/06/23  PLAN:  PT FREQUENCY: 1-2x/week  PT DURATION: 12 weeks  PLANNED INTERVENTIONS: Therapeutic exercises, Therapeutic activity, Neuromuscular re-education, Balance training, Gait training, Patient/Family education, Self Care, Joint mobilization, Moist heat, Taping, and Manual therapy  PLAN FOR NEXT SESSION:will work on her safe transfers and try to help with stabilization of the left knee   Grayce Sessions, PTA

## 2023-09-18 ENCOUNTER — Encounter: Payer: Self-pay | Admitting: Physical Therapy

## 2023-09-18 ENCOUNTER — Ambulatory Visit: Payer: Medicare Other | Admitting: Physical Therapy

## 2023-09-18 DIAGNOSIS — M25512 Pain in left shoulder: Secondary | ICD-10-CM | POA: Diagnosis not present

## 2023-09-18 DIAGNOSIS — R262 Difficulty in walking, not elsewhere classified: Secondary | ICD-10-CM | POA: Diagnosis not present

## 2023-09-18 DIAGNOSIS — R270 Ataxia, unspecified: Secondary | ICD-10-CM

## 2023-09-18 DIAGNOSIS — M6281 Muscle weakness (generalized): Secondary | ICD-10-CM

## 2023-09-18 DIAGNOSIS — M25572 Pain in left ankle and joints of left foot: Secondary | ICD-10-CM | POA: Diagnosis not present

## 2023-09-18 DIAGNOSIS — I6990 Unspecified sequelae of unspecified cerebrovascular disease: Secondary | ICD-10-CM

## 2023-09-18 DIAGNOSIS — I69354 Hemiplegia and hemiparesis following cerebral infarction affecting left non-dominant side: Secondary | ICD-10-CM

## 2023-09-18 DIAGNOSIS — M542 Cervicalgia: Secondary | ICD-10-CM

## 2023-09-18 NOTE — Therapy (Signed)
OUTPATIENT PHYSICAL THERAPY LOWER EXTREMITY TREATMENT     Patient Name: Karen Jackson MRN: [id number] DOB:12/09/1940, 83 y.o., female Today's Date: 09/18/2023  END OF SESSION:  PT End of Session - 09/18/23 1112     Visit Number 33    Date for PT Re-Evaluation 09/23/23    Authorization Type Medicare    PT Start Time 1055    PT Stop Time 1144    PT Time Calculation (min) 49 min    Activity Tolerance Patient tolerated treatment well    Behavior During Therapy WFL for tasks assessed/performed             Past Medical History:  Diagnosis Date   Acute cystitis without hematuria    Acute diastolic CHF (congestive heart failure) (HCC)    Arthritis    Dyspnea    Dysrhythmia    Fever of unknown origin 03/19/2017   Hyperlipidemia    Hypertension    denies at preop   Multifocal pneumonia    Neuromuscular disorder (HCC)    neuropathy left arm and foot   Osteopenia    Paralysis (HCC)    partial left side from CVA    Persistent atrial fibrillation (HCC)    PONV (postoperative nausea and vomiting)    Pre-diabetes    Stroke [facility]) 2013   hemmorahgic   Past Surgical History:  Procedure Laterality Date   ANKLE SURGERY     APPENDECTOMY     CHOLECYSTECTOMY     HARDWARE REMOVAL Left 03/29/2023   Procedure: HARDWARE REMOVAL;  Surgeon: Toni Arthurs, MD;  Location: [facility];  Service: Orthopedics;  Laterality: Left;   HERNIA REPAIR     Esophagus   INCISION AND DRAINAGE OF WOUND Left 03/29/2023   Procedure: LEFT ANKLE WOUND IRRIGATION AND DEBRIDEMENT WOUND;  Surgeon: Toni Arthurs, MD;  Location: [facility];  Service: Orthopedics;  Laterality: Left;   JOINT REPLACEMENT     total- right partial- left   MASTECTOMY PARTIAL / LUMPECTOMY  2012   left   ORIF ANKLE FRACTURE Left 07/20/2018   Procedure: OPEN REDUCTION INTERNAL FIXATION (ORIF) ANKLE FRACTURE;  Surgeon: Toni Arthurs, MD;  Location: [facility];  Service: Orthopedics;  Laterality: Left;   TOTAL KNEE  ARTHROPLASTY Left 01/27/2019   Procedure: TOTAL KNEE ARTHROPLASTY;  Surgeon: Dannielle Huh, MD;  Location: [facility];  Service: Orthopedics;  Laterality: Left;   Patient Active Problem List   Diagnosis Date Noted   Goals of care, counseling/discussion 01/17/2022   Grief 01/17/2022   Secondary hypercoagulable state (HCC) 03/16/2021   Dizziness 03/10/2021   Orthostatic hypotension 10/15/2020   Presbycusis of both ears 03/08/2020   Mixed stress and urge urinary incontinence 12/02/2019   Macrocytosis 12/01/2019   Nutritional anemia 12/01/2019   S/P total knee replacement 01/27/2019   Recurrent left knee instability 07/05/2018   Respiratory failure with hypoxia (HCC) 08/30/2017   Hypoxemia    Heart failure with preserved ejection fraction (HCC), Grade 3 diastolic dysfunction 03/26/2017   PAF (paroxysmal atrial fibrillation) (HCC)    Dyspnea 03/19/2017   Encounter for preventive health examination 02/17/2016   Sensorineural hearing loss (SNHL), bilateral 01/26/2016   Hypomagnesemia 04/24/2014   Hemiparesis affecting left side as late effect of cerebrovascular accident (HCC) 04/24/2014   Nontraumatic cerebral hemorrhage (HCC) 04/30/2012   DM (diabetes mellitus) with complications (HCC) 03/04/2010   OSTEOPENIA 01/21/2009   UNSPECIFIED VITAMIN D DEFICIENCY 11/19/2007   HYPERCHOLESTEROLEMIA 10/25/2006   GASTROESOPHAGEAL REFLUX, NO ESOPHAGITIS 10/25/2006   DIVERTICULOSIS  OF COLON 10/25/2006   Osteoarthritis 10/25/2006   CERVICAL SPINE DISORDER, NOS 10/25/2006    PCP: Waynard Edwards, MD  REFERRING PROVIDER: Waynard Edwards, MD  REFERRING DIAG: s/p left ankle ORIF hardware removal  THERAPY DIAG:  Muscle weakness (generalized)  Left shoulder pain, unspecified chronicity  Pain in left ankle and joints of left foot  Difficulty in walking, not elsewhere classified  Ataxia  Hemiplegia and hemiparesis following cerebral infarction affecting left non-dominant side (HCC)  Late effects of CVA  (cerebrovascular accident)  Cervicalgia  Rationale for Evaluation and Treatment: Rehabilitation  ONSET DATE: 03/29/23  SUBJECTIVE:   SUBJECTIVE STATEMENT: Pretty stiff and sore, weather is cold  PERTINENT HISTORY: CVA, CHF, HTN, A-fib, TKA PAIN:  Are you having pain? Yes: NPRS scale: 6/10 Pain location: left upper trap and neck, left ankle and knee  Pain description: ache sore Aggravating factors: neck and shoulder always hurt, knee and ankle hurt with transfers Relieving factors: massage, heat  PRECAUTIONS: None  RED FLAGS: None   WEIGHT BEARING RESTRICTIONS: No  FALLS:  Has patient fallen in last 6 months? No  LIVING ENVIRONMENT: Lives with: lives alone Lives in: House/apartment Stairs: No Has following equipment at home: Environmental consultant - 2 wheeled, Wheelchair (manual), shower chair, bed side commode, Grab bars, and Ramped entry  OCCUPATION: retired  PLOF: Needs assistance with homemaking, Needs assistance with transfers, and Leisure: plays bridge, she has 11 hours of an aide at home that helps with meals, going to MD's, dressing and bathing, is alone at night  PATIENT GOALS: walk, have less pain, transfer without difficulty  NEXT MD VISIT: none scheduled  OBJECTIVE:   DIAGNOSTIC FINDINGS: none performed  COGNITION: Overall cognitive status: Within functional limits for tasks assessed     SENSATION: WFL POSTURE: rounded shoulders and forward head  PALPATION: Left knee and ankle are swollen and tender, left upper and neck  LOWER EXTREMITY ROM:  Active ROM Right eval Left eval  Left 07/31/23 Left 08/16/23  Hip flexion       Hip extension       Hip abduction       Hip adduction       Hip internal rotation       Hip external rotation       Knee flexion  90 93 96 95  Knee extension  0 0 0 0  Ankle dorsiflexion  5     Ankle plantarflexion  30 44 49 52  Ankle inversion  5     Ankle eversion  0      (Blank rows = not tested)  LOWER EXTREMITY MMT:  MMT  Right eval Left eval Left 06/21/23 Left 07/31/23 08/23/23 left  Hip flexion 4- 3+ 4- 4 4-  Hip extension       Hip abduction 4 3+ 4- 4+ 4  Hip adduction 4 3+ 4- 4 4-  Hip internal rotation       Hip external rotation       Knee flexion 4- 3+ 4 4 4+  Knee extension 4- 3+ 4- 4 4  Ankle dorsiflexion     4-  Ankle plantarflexion     4-  Ankle inversion       Ankle eversion        (Blank rows = not tested)  FUNCTIONAL TESTS:  Transfers Max A, left knee gives out and goes into ER and tends to bow into varus, has not walked in about 18 months, and the last time I saw her we  could walk about 60 feet with HHA and w/c following  GAIT: Distance walked: unable   TODAY'S TREATMENT:                                                                                                                              DATE:  09/18/23 STM to the upper traps and into the rhomboids Passive stretch left ankle DF Nustep level 5 x 6:40 500 steps Standing weight shifts Green tband HS curls difficult for her Green tband ankle PF/DF Green tband hip adduction very difficult Gait with HHA and w/c follow x 60', rest then 48 ' 5# LAQ 2.5# marches  09/12/22 NuStep L5 x 7:39 min 500 steps  STM with t-gun to the upper traps and neck Red tband hip adduction Hip Abd LLE red 2x15  Red tband HS curl LLE  Red tband PF/DF LLE 5lb march LLE LAQ 5lb 2x10 Approximation to L digits Gait with HHA and w/c follow 55', stops due to fatigue  09/11/23 NuStep L5 x 6 min STM with t-gun to the upper traps and neck Gait with HHA and w/c follow 63' and 46', stops due to fatigue 5# LAQ Red tband PF/DF Red tband HS curl Red tband hip adduction Approximation to L digits  09/06/23 Nustep level 5 x 8 minutes STM with t-gun to the upper traps and neck Gait with HHA and w/c follow x20 feet stopped due to left knee pain Standing weight shifts and had her bend and flex the knee multiple times, rest and repeat Then was able to  walk with less pain x 45 feet  5# LAQ Red tband PF/DF Red tband HS curl Red tband hip adduction  09/04/23 Nustp level 5 x 7 minutes 5# marches 5# LAQ Gait with HHA and w/c follow 2x 60 feet Red tand HA curls Red tband hip adduction and abduction left only Red tband ankle PF/DF STM to the left upper trap and into the neck Stnading weight shifts  08/30/23 Red tband ankle PF/DF Red tband hip adduction and abduction Red HS curls 5# LAQ 3# marches STM to the left upper trap and neck Nustep level 5 x 8 minutes Walking with HHA and w/c follow x 40 feet stopped due to left medial knee pain Standing weight shift trying to help with the knee pain  08/23/23 STM to the left upper trap and neck Nustep level 5 x 8 minutes less trunk lean and better control of the left leg today MMT and testing of transfers and gait If she does not have anything to pull up on , she cannot push up into standing from sitting needs Max A like this with PT in front holding on she min/modA 5# LAQ\ 5# Marches Red tband left hip abduction and adduction Gait with HHA and w/c follow 54 feet   PATIENT EDUCATION:  Education details: POC Person educated: Patient Education method: Explanation Education comprehension: verbalized understanding  HOME EXERCISE PROGRAM: TBD  ASSESSMENT:  CLINICAL IMPRESSION: Pt with some left knee pain for the first few steps but it eases off.  She was much better today with her transfers, much safer. Still with a knot in the left upper trap and the neck this is due to the hemiplegia on this side and a synergy that happens and causes the left shoulder to elevate with other activities. She is getting stronger as I advanced to green tband, this is difficult for her but she can do  OBJECTIVE IMPAIRMENTS: Abnormal gait, cardiopulmonary status limiting activity, decreased activity tolerance, decreased balance, decreased coordination, decreased endurance, decreased mobility, difficulty  walking, decreased ROM, decreased strength, increased edema, impaired flexibility, improper body mechanics, postural dysfunction, and pain.   REHAB POTENTIAL: Good  CLINICAL DECISION MAKING: Stable/uncomplicated  EVALUATION COMPLEXITY: Low   GOALS: Goals reviewed with patient? Yes  SHORT TERM GOALS: Target date: 06/02/23 Independent with advanced HEP Goal status: met 06/07/23  LONG TERM GOALS: Target date: 08/21/23  Independent with advanced HEP with caregiver Goal status: ongoing 09/06/23  2.  Transfer with set up and CGA Goal status: Ongoing 09/06/23 min/mod A  3.  Walk HHA x 100 feet Goal status: Progressing 09/18/23 60'  4.  Increase left LE strength to 4/5 Goal status: Met 07/31/23  5.  Report neck and shoulder pain decreased 50% Goal status: Progressing 30% 09/06/23  PLAN:  PT FREQUENCY: 1-2x/week  PT DURATION: 12 weeks  PLANNED INTERVENTIONS: Therapeutic exercises, Therapeutic activity, Neuromuscular re-education, Balance training, Gait training, Patient/Family education, Self Care, Joint mobilization, Moist heat, Taping, and Manual therapy  PLAN FOR NEXT SESSION:  strength, safe transfers and better overall ability to function with caregivers   Jearld Lesch, PT

## 2023-09-20 ENCOUNTER — Encounter: Payer: Self-pay | Admitting: Physical Therapy

## 2023-09-20 ENCOUNTER — Ambulatory Visit: Payer: Medicare Other | Admitting: Physical Therapy

## 2023-09-20 DIAGNOSIS — M542 Cervicalgia: Secondary | ICD-10-CM

## 2023-09-20 DIAGNOSIS — M25572 Pain in left ankle and joints of left foot: Secondary | ICD-10-CM | POA: Diagnosis not present

## 2023-09-20 DIAGNOSIS — R270 Ataxia, unspecified: Secondary | ICD-10-CM

## 2023-09-20 DIAGNOSIS — M6281 Muscle weakness (generalized): Secondary | ICD-10-CM | POA: Diagnosis not present

## 2023-09-20 DIAGNOSIS — R293 Abnormal posture: Secondary | ICD-10-CM

## 2023-09-20 DIAGNOSIS — M25512 Pain in left shoulder: Secondary | ICD-10-CM | POA: Diagnosis not present

## 2023-09-20 DIAGNOSIS — I69354 Hemiplegia and hemiparesis following cerebral infarction affecting left non-dominant side: Secondary | ICD-10-CM | POA: Diagnosis not present

## 2023-09-20 DIAGNOSIS — R262 Difficulty in walking, not elsewhere classified: Secondary | ICD-10-CM | POA: Diagnosis not present

## 2023-09-20 DIAGNOSIS — I6990 Unspecified sequelae of unspecified cerebrovascular disease: Secondary | ICD-10-CM

## 2023-09-20 NOTE — Therapy (Signed)
OUTPATIENT PHYSICAL THERAPY LOWER EXTREMITY TREATMENT     Patient Name: Karen Jackson MRN: [id number] DOB:11-18-1940, 83 y.o., female Today's Date: 09/20/2023  END OF SESSION:  PT End of Session - 09/20/23 1116     Visit Number 34    Date for PT Re-Evaluation 09/23/23    Authorization Type Medicare    PT Start Time 1055    PT Stop Time 1142    PT Time Calculation (min) 47 min    Activity Tolerance Patient tolerated treatment well    Behavior During Therapy WFL for tasks assessed/performed             Past Medical History:  Diagnosis Date   Acute cystitis without hematuria    Acute diastolic CHF (congestive heart failure) (HCC)    Arthritis    Dyspnea    Dysrhythmia    Fever of unknown origin 03/19/2017   Hyperlipidemia    Hypertension    denies at preop   Multifocal pneumonia    Neuromuscular disorder (HCC)    neuropathy left arm and foot   Osteopenia    Paralysis (HCC)    partial left side from CVA    Persistent atrial fibrillation (HCC)    PONV (postoperative nausea and vomiting)    Pre-diabetes    Stroke [facility]) 2013   hemmorahgic   Past Surgical History:  Procedure Laterality Date   ANKLE SURGERY     APPENDECTOMY     CHOLECYSTECTOMY     HARDWARE REMOVAL Left 03/29/2023   Procedure: HARDWARE REMOVAL;  Surgeon: Toni Arthurs, MD;  Location: [facility];  Service: Orthopedics;  Laterality: Left;   HERNIA REPAIR     Esophagus   INCISION AND DRAINAGE OF WOUND Left 03/29/2023   Procedure: LEFT ANKLE WOUND IRRIGATION AND DEBRIDEMENT WOUND;  Surgeon: Toni Arthurs, MD;  Location: [facility];  Service: Orthopedics;  Laterality: Left;   JOINT REPLACEMENT     total- right partial- left   MASTECTOMY PARTIAL / LUMPECTOMY  2012   left   ORIF ANKLE FRACTURE Left 07/20/2018   Procedure: OPEN REDUCTION INTERNAL FIXATION (ORIF) ANKLE FRACTURE;  Surgeon: Toni Arthurs, MD;  Location: [facility];  Service: Orthopedics;  Laterality: Left;   TOTAL KNEE  ARTHROPLASTY Left 01/27/2019   Procedure: TOTAL KNEE ARTHROPLASTY;  Surgeon: Dannielle Huh, MD;  Location: [facility];  Service: Orthopedics;  Laterality: Left;   Patient Active Problem List   Diagnosis Date Noted   Goals of care, counseling/discussion 01/17/2022   Grief 01/17/2022   Secondary hypercoagulable state (HCC) 03/16/2021   Dizziness 03/10/2021   Orthostatic hypotension 10/15/2020   Presbycusis of both ears 03/08/2020   Mixed stress and urge urinary incontinence 12/02/2019   Macrocytosis 12/01/2019   Nutritional anemia 12/01/2019   S/P total knee replacement 01/27/2019   Recurrent left knee instability 07/05/2018   Respiratory failure with hypoxia (HCC) 08/30/2017   Hypoxemia    Heart failure with preserved ejection fraction (HCC), Grade 3 diastolic dysfunction 03/26/2017   PAF (paroxysmal atrial fibrillation) (HCC)    Dyspnea 03/19/2017   Encounter for preventive health examination 02/17/2016   Sensorineural hearing loss (SNHL), bilateral 01/26/2016   Hypomagnesemia 04/24/2014   Hemiparesis affecting left side as late effect of cerebrovascular accident (HCC) 04/24/2014   Nontraumatic cerebral hemorrhage (HCC) 04/30/2012   DM (diabetes mellitus) with complications (HCC) 03/04/2010   OSTEOPENIA 01/21/2009   UNSPECIFIED VITAMIN D DEFICIENCY 11/19/2007   HYPERCHOLESTEROLEMIA 10/25/2006   GASTROESOPHAGEAL REFLUX, NO ESOPHAGITIS 10/25/2006   DIVERTICULOSIS  OF COLON 10/25/2006   Osteoarthritis 10/25/2006   CERVICAL SPINE DISORDER, NOS 10/25/2006    PCP: Waynard Edwards, MD  REFERRING PROVIDER: Waynard Edwards, MD  REFERRING DIAG: s/p left ankle ORIF hardware removal  THERAPY DIAG:  Muscle weakness (generalized)  Left shoulder pain, unspecified chronicity  Pain in left ankle and joints of left foot  Difficulty in walking, not elsewhere classified  Ataxia  Hemiplegia and hemiparesis following cerebral infarction affecting left non-dominant side (HCC)  Late effects of CVA  (cerebrovascular accident)  Cervicalgia  Abnormal posture  Rationale for Evaluation and Treatment: Rehabilitation  ONSET DATE: 03/29/23  SUBJECTIVE:   SUBJECTIVE STATEMENT: Just cold and stiff  PERTINENT HISTORY: CVA, CHF, HTN, A-fib, TKA PAIN:  Are you having pain? Yes: NPRS scale: 6/10 Pain location: left upper trap and neck, left ankle and knee  Pain description: ache sore Aggravating factors: neck and shoulder always hurt, knee and ankle hurt with transfers Relieving factors: massage, heat  PRECAUTIONS: None  RED FLAGS: None   WEIGHT BEARING RESTRICTIONS: No  FALLS:  Has patient fallen in last 6 months? No  LIVING ENVIRONMENT: Lives with: lives alone Lives in: House/apartment Stairs: No Has following equipment at home: Environmental consultant - 2 wheeled, Wheelchair (manual), shower chair, bed side commode, Grab bars, and Ramped entry  OCCUPATION: retired  PLOF: Needs assistance with homemaking, Needs assistance with transfers, and Leisure: plays bridge, she has 11 hours of an aide at home that helps with meals, going to MD's, dressing and bathing, is alone at night  PATIENT GOALS: walk, have less pain, transfer without difficulty  NEXT MD VISIT: none scheduled  OBJECTIVE:   DIAGNOSTIC FINDINGS: none performed  COGNITION: Overall cognitive status: Within functional limits for tasks assessed     SENSATION: WFL POSTURE: rounded shoulders and forward head  PALPATION: Left knee and ankle are swollen and tender, left upper and neck  LOWER EXTREMITY ROM:  Active ROM Right eval Left eval  Left 07/31/23 Left 08/16/23  Hip flexion       Hip extension       Hip abduction       Hip adduction       Hip internal rotation       Hip external rotation       Knee flexion  90 93 96 95  Knee extension  0 0 0 0  Ankle dorsiflexion  5     Ankle plantarflexion  30 44 49 52  Ankle inversion  5     Ankle eversion  0      (Blank rows = not tested)  LOWER EXTREMITY MMT:  MMT  Right eval Left eval Left 06/21/23 Left 07/31/23 08/23/23 left  Hip flexion 4- 3+ 4- 4 4-  Hip extension       Hip abduction 4 3+ 4- 4+ 4  Hip adduction 4 3+ 4- 4 4-  Hip internal rotation       Hip external rotation       Knee flexion 4- 3+ 4 4 4+  Knee extension 4- 3+ 4- 4 4  Ankle dorsiflexion     4-  Ankle plantarflexion     4-  Ankle inversion       Ankle eversion        (Blank rows = not tested)  FUNCTIONAL TESTS:  Transfers Max A, left knee gives out and goes into ER and tends to bow into varus, has not walked in about 18 months, and the last time I saw her we  could walk about 60 feet with HHA and w/c following  GAIT: Distance walked: unable   TODAY'S TREATMENT:                                                                                                                              DATE: 09/20/23 Green tband ankle all motions green tband hip abduction and adduction, focus on adduction increased reps and sets green tband HS curls Nustep level 5 x 500 steps 8 minutes, more difficulty today keeping the knee in, required cues STM to the left and right upper trap and neck area due to pain and tightness Gait with HHA and w/c follow 63', then repeated and did 52'  09/18/23 STM to the upper traps and into the rhomboids Passive stretch left ankle DF Nustep level 5 x 6:40 500 steps Standing weight shifts Green tband HS curls difficult for her Green tband ankle PF/DF Green tband hip adduction very difficult Gait with HHA and w/c follow x 60', rest then 48 ' 5# LAQ 2.5# marches  09/12/22 NuStep L5 x 7:39 min 500 steps  STM with t-gun to the upper traps and neck Red tband hip adduction Hip Abd LLE red 2x15  Red tband HS curl LLE  Red tband PF/DF LLE 5lb march LLE LAQ 5lb 2x10 Approximation to L digits Gait with HHA and w/c follow 55', stops due to fatigue  09/11/23 NuStep L5 x 6 min STM with t-gun to the upper traps and neck Gait with HHA and w/c follow 61' and  46', stops due to fatigue 5# LAQ Red tband PF/DF Red tband HS curl Red tband hip adduction Approximation to L digits  09/06/23 Nustep level 5 x 8 minutes STM with t-gun to the upper traps and neck Gait with HHA and w/c follow x20 feet stopped due to left knee pain Standing weight shifts and had her bend and flex the knee multiple times, rest and repeat Then was able to walk with less pain x 45 feet  5# LAQ Red tband PF/DF Red tband HS curl Red tband hip adduction  09/04/23 Nustp level 5 x 7 minutes 5# marches 5# LAQ Gait with HHA and w/c follow 2x 60 feet Red tand HA curls Red tband hip adduction and abduction left only Red tband ankle PF/DF STM to the left upper trap and into the neck Stnading weight shifts  08/30/23 Red tband ankle PF/DF Red tband hip adduction and abduction Red HS curls 5# LAQ 3# marches STM to the left upper trap and neck Nustep level 5 x 8 minutes Walking with HHA and w/c follow x 40 feet stopped due to left medial knee pain Standing weight shift trying to help with the knee pain  08/23/23 STM to the left upper trap and neck Nustep level 5 x 8 minutes less trunk lean and better control of the left leg today MMT and testing of transfers and gait If she does not have anything  to pull up on , she cannot push up into standing from sitting needs Max A like this with PT in front holding on she min/modA 5# LAQ\ 5# Marches Red tband left hip abduction and adduction Gait with HHA and w/c follow 54 feet   PATIENT EDUCATION:  Education details: POC Person educated: Patient Education method: Explanation Education comprehension: verbalized understanding  HOME EXERCISE PROGRAM: TBD  ASSESSMENT:  CLINICAL IMPRESSION: Pt definitely as some ups and downs, last visit did the 500 steps in 6 1/2 minutes and today struggled to perform in 8 minutes, she had more difficulty with the left leg today falling out.  Still with a knot in the left upper trap and the  neck this is due to the hemiplegia on this side and a synergy that happens and causes the left shoulder to elevate with other activities. She is getting stronger as I advanced to green tband, this is difficult for her but she can do it with some verbal cues.  OBJECTIVE IMPAIRMENTS: Abnormal gait, cardiopulmonary status limiting activity, decreased activity tolerance, decreased balance, decreased coordination, decreased endurance, decreased mobility, difficulty walking, decreased ROM, decreased strength, increased edema, impaired flexibility, improper body mechanics, postural dysfunction, and pain.   REHAB POTENTIAL: Good  CLINICAL DECISION MAKING: Stable/uncomplicated  EVALUATION COMPLEXITY: Low   GOALS: Goals reviewed with patient? Yes  SHORT TERM GOALS: Target date: 06/02/23 Independent with advanced HEP Goal status: met 06/07/23  LONG TERM GOALS: Target date: 08/21/23  Independent with advanced HEP with caregiver Goal status: ongoing 09/06/23  2.  Transfer with set up and CGA Goal status: Ongoing 09/06/23 min/mod A  3.  Walk HHA x 100 feet Goal status: Progressing 09/18/23 60'  4.  Increase left LE strength to 4/5 Goal status: Met 07/31/23  5.  Report neck and shoulder pain decreased 50% Goal status: Progressing 30% 09/06/23  PLAN:  PT FREQUENCY: 1-2x/week  PT DURATION: 12 weeks  PLANNED INTERVENTIONS: Therapeutic exercises, Therapeutic activity, Neuromuscular re-education, Balance training, Gait training, Patient/Family education, Self Care, Joint mobilization, Moist heat, Taping, and Manual therapy  PLAN FOR NEXT SESSION:  will assess goals and her abilities next visit   Jearld Lesch, PT

## 2023-09-24 DIAGNOSIS — H6123 Impacted cerumen, bilateral: Secondary | ICD-10-CM | POA: Diagnosis not present

## 2023-09-24 DIAGNOSIS — H903 Sensorineural hearing loss, bilateral: Secondary | ICD-10-CM | POA: Diagnosis not present

## 2023-09-25 ENCOUNTER — Ambulatory Visit: Payer: Medicare Other | Admitting: Physical Therapy

## 2023-09-25 ENCOUNTER — Encounter: Payer: Self-pay | Admitting: Physical Therapy

## 2023-09-25 DIAGNOSIS — I69354 Hemiplegia and hemiparesis following cerebral infarction affecting left non-dominant side: Secondary | ICD-10-CM

## 2023-09-25 DIAGNOSIS — R270 Ataxia, unspecified: Secondary | ICD-10-CM

## 2023-09-25 DIAGNOSIS — R293 Abnormal posture: Secondary | ICD-10-CM

## 2023-09-25 DIAGNOSIS — M25512 Pain in left shoulder: Secondary | ICD-10-CM

## 2023-09-25 DIAGNOSIS — R262 Difficulty in walking, not elsewhere classified: Secondary | ICD-10-CM | POA: Diagnosis not present

## 2023-09-25 DIAGNOSIS — M542 Cervicalgia: Secondary | ICD-10-CM

## 2023-09-25 DIAGNOSIS — I6990 Unspecified sequelae of unspecified cerebrovascular disease: Secondary | ICD-10-CM

## 2023-09-25 DIAGNOSIS — M6281 Muscle weakness (generalized): Secondary | ICD-10-CM

## 2023-09-25 DIAGNOSIS — M25572 Pain in left ankle and joints of left foot: Secondary | ICD-10-CM | POA: Diagnosis not present

## 2023-09-25 NOTE — Therapy (Signed)
OUTPATIENT PHYSICAL THERAPY LOWER EXTREMITY TREATMENT     Patient Name: Karen Jackson MRN: [id number] DOB:1941/05/03, 83 y.o., female Today's Date: 09/25/2023  END OF SESSION:  PT End of Session - 09/25/23 1011     Visit Number 35    Date for PT Re-Evaluation 10/26/23    Authorization Type Medicare    PT Start Time 1010    PT Stop Time 1055    PT Time Calculation (min) 45 min    Activity Tolerance Patient tolerated treatment well    Behavior During Therapy [facility] for tasks assessed/performed             Past Medical History:  Diagnosis Date   Acute cystitis without hematuria    Acute diastolic CHF (congestive heart failure) (HCC)    Arthritis    Dyspnea    Dysrhythmia    Fever of unknown origin 03/19/2017   Hyperlipidemia    Hypertension    denies at preop   Multifocal pneumonia    Neuromuscular disorder (HCC)    neuropathy left arm and foot   Osteopenia    Paralysis (HCC)    partial left side from CVA    Persistent atrial fibrillation (HCC)    PONV (postoperative nausea and vomiting)    Pre-diabetes    Stroke [facility]) 2013   hemmorahgic   Past Surgical History:  Procedure Laterality Date   ANKLE SURGERY     APPENDECTOMY     CHOLECYSTECTOMY     HARDWARE REMOVAL Left 03/29/2023   Procedure: HARDWARE REMOVAL;  Surgeon: Toni Arthurs, MD;  Location: [facility];  Service: Orthopedics;  Laterality: Left;   HERNIA REPAIR     Esophagus   INCISION AND DRAINAGE OF WOUND Left 03/29/2023   Procedure: LEFT ANKLE WOUND IRRIGATION AND DEBRIDEMENT WOUND;  Surgeon: Toni Arthurs, MD;  Location: [facility];  Service: Orthopedics;  Laterality: Left;   JOINT REPLACEMENT     total- right partial- left   MASTECTOMY PARTIAL / LUMPECTOMY  2012   left   ORIF ANKLE FRACTURE Left 07/20/2018   Procedure: OPEN REDUCTION INTERNAL FIXATION (ORIF) ANKLE FRACTURE;  Surgeon: Toni Arthurs, MD;  Location: [facility];  Service: Orthopedics;  Laterality: Left;   TOTAL KNEE  ARTHROPLASTY Left 01/27/2019   Procedure: TOTAL KNEE ARTHROPLASTY;  Surgeon: Dannielle Huh, MD;  Location: [facility];  Service: Orthopedics;  Laterality: Left;   Patient Active Problem List   Diagnosis Date Noted   Goals of care, counseling/discussion 01/17/2022   Grief 01/17/2022   Secondary hypercoagulable state (HCC) 03/16/2021   Dizziness 03/10/2021   Orthostatic hypotension 10/15/2020   Presbycusis of both ears 03/08/2020   Mixed stress and urge urinary incontinence 12/02/2019   Macrocytosis 12/01/2019   Nutritional anemia 12/01/2019   S/P total knee replacement 01/27/2019   Recurrent left knee instability 07/05/2018   Respiratory failure with hypoxia (HCC) 08/30/2017   Hypoxemia    Heart failure with preserved ejection fraction (HCC), Jackson 3 diastolic dysfunction 03/26/2017   PAF (paroxysmal atrial fibrillation) (HCC)    Dyspnea 03/19/2017   Encounter for preventive health examination 02/17/2016   Sensorineural hearing loss (SNHL), bilateral 01/26/2016   Hypomagnesemia 04/24/2014   Hemiparesis affecting left side as late effect of cerebrovascular accident (HCC) 04/24/2014   Nontraumatic cerebral hemorrhage (HCC) 04/30/2012   DM (diabetes mellitus) with complications (HCC) 03/04/2010   OSTEOPENIA 01/21/2009   UNSPECIFIED VITAMIN D DEFICIENCY 11/19/2007   HYPERCHOLESTEROLEMIA 10/25/2006   GASTROESOPHAGEAL REFLUX, NO ESOPHAGITIS 10/25/2006   DIVERTICULOSIS  OF COLON 10/25/2006   Osteoarthritis 10/25/2006   CERVICAL SPINE DISORDER, NOS 10/25/2006    PCP: Waynard Edwards, MD  REFERRING PROVIDER: Waynard Edwards, MD  REFERRING DIAG: s/p left ankle ORIF hardware removal  THERAPY DIAG:  Muscle weakness (generalized)  Left shoulder pain, unspecified chronicity  Pain in left ankle and joints of left foot  Difficulty in walking, not elsewhere classified  Ataxia  Hemiplegia and hemiparesis following cerebral infarction affecting left non-dominant side (HCC)  Late effects of CVA  (cerebrovascular accident)  Cervicalgia  Abnormal posture  Rationale for Evaluation and Treatment: Rehabilitation  ONSET DATE: 03/29/23  SUBJECTIVE:   SUBJECTIVE STATEMENT: Did not do much this weekend, eveyone was sick PERTINENT HISTORY: CVA, CHF, HTN, A-fib, TKA PAIN:  Are you having pain? Yes: NPRS scale: 6/10 Pain location: left upper trap and neck, left ankle and knee  Pain description: ache sore Aggravating factors: neck and shoulder always hurt, knee and ankle hurt with transfers Relieving factors: massage, heat  PRECAUTIONS: None  RED FLAGS: None   WEIGHT BEARING RESTRICTIONS: No  FALLS:  Has patient fallen in last 6 months? No  LIVING ENVIRONMENT: Lives with: lives alone Lives in: House/apartment Stairs: No Has following equipment at home: Environmental consultant - 2 wheeled, Wheelchair (manual), shower chair, bed side commode, Grab bars, and Ramped entry  OCCUPATION: retired  PLOF: Needs assistance with homemaking, Needs assistance with transfers, and Leisure: plays bridge, she has 11 hours of an aide at home that helps with meals, going to MD's, dressing and bathing, is alone at night  PATIENT GOALS: walk, have less pain, transfer without difficulty  NEXT MD VISIT: none scheduled  OBJECTIVE:   DIAGNOSTIC FINDINGS: none performed  COGNITION: Overall cognitive status: Within functional limits for tasks assessed     SENSATION: WFL POSTURE: rounded shoulders and forward head  PALPATION: Left knee and ankle are swollen and tender, left upper and neck  LOWER EXTREMITY ROM:  Active ROM Right eval Left eval  Left 07/31/23 Left 08/16/23  Hip flexion       Hip extension       Hip abduction       Hip adduction       Hip internal rotation       Hip external rotation       Knee flexion  90 93 96 95  Knee extension  0 0 0 0  Ankle dorsiflexion  5     Ankle plantarflexion  30 44 49 52  Ankle inversion  5     Ankle eversion  0      (Blank rows = not  tested)  LOWER EXTREMITY MMT:  MMT Right eval Left eval Left 06/21/23 Left 07/31/23 08/23/23 left 09/25/23  Hip flexion 4- 3+ 4- 4 4- 4-  Hip extension        Hip abduction 4 3+ 4- 4+ 4 4  Hip adduction 4 3+ 4- 4 4- 4  Hip internal rotation        Hip external rotation        Knee flexion 4- 3+ 4 4 4+ 4+  Knee extension 4- 3+ 4- 4 4 4   Ankle dorsiflexion     4- 4  Ankle plantarflexion     4- 4  Ankle inversion        Ankle eversion         (Blank rows = not tested)  FUNCTIONAL TESTS:  Transfers Max A, left knee gives out and goes into ER and tends to bow  into varus, has not walked in about 18 months, and the last time I saw her we could walk about 60 feet with HHA and w/c following  GAIT: Distance walked: unable   TODAY'S TREATMENT:                                                                                                                              DATE: 09/25/23 Nustep level 5 x 8 minutes STM to the left upper trap, rhomboid and neck Weighed her at 147# with two people helping Gait with w/c follow and HHA 69 feet, rest and then 60 feet Green tband HS curls Green tband ankle exercises Red tband hip adduction Red tband hip clamshells 5# LAQ 5# marches Standing weight shifts and orking on posture  09/20/23 Green tband ankle all motions green tband hip abduction and adduction, focus on adduction increased reps and sets green tband HS curls Nustep level 5 x 500 steps 8 minutes, more difficulty today keeping the knee in, required cues STM to the left and right upper trap and neck area due to pain and tightness Gait with HHA and w/c follow 63', then repeated and did 52'  09/18/23 STM to the upper traps and into the rhomboids Passive stretch left ankle DF Nustep level 5 x 6:40 500 steps Standing weight shifts Green tband HS curls difficult for her Green tband ankle PF/DF Green tband hip adduction very difficult Gait with HHA and w/c follow x 60', rest then 48  ' 5# LAQ 2.5# marches  09/12/22 NuStep L5 x 7:39 min 500 steps  STM with t-gun to the upper traps and neck Red tband hip adduction Hip Abd LLE red 2x15  Red tband HS curl LLE  Red tband PF/DF LLE 5lb march LLE LAQ 5lb 2x10 Approximation to L digits Gait with HHA and w/c follow 55', stops due to fatigue  09/11/23 NuStep L5 x 6 min STM with t-gun to the upper traps and neck Gait with HHA and w/c follow 51' and 46', stops due to fatigue 5# LAQ Red tband PF/DF Red tband HS curl Red tband hip adduction Approximation to L digits  09/06/23 Nustep level 5 x 8 minutes STM with t-gun to the upper traps and neck Gait with HHA and w/c follow x20 feet stopped due to left knee pain Standing weight shifts and had her bend and flex the knee multiple times, rest and repeat Then was able to walk with less pain x 45 feet  5# LAQ Red tband PF/DF Red tband HS curl Red tband hip adduction  09/04/23 Nustp level 5 x 7 minutes 5# marches 5# LAQ Gait with HHA and w/c follow 2x 60 feet Red tand HA curls Red tband hip adduction and abduction left only Red tband ankle PF/DF STM to the left upper trap and into the neck Stnading weight shifts  08/30/23 Red tband ankle PF/DF Red tband hip adduction and abduction Red HS curls 5# LAQ  3# marches STM to the left upper trap and neck Nustep level 5 x 8 minutes Walking with HHA and w/c follow x 40 feet stopped due to left medial knee pain Standing weight shift trying to help with the knee pain    PATIENT EDUCATION:  Education details: POC Person educated: Patient Education method: Explanation Education comprehension: verbalized understanding  HOME EXERCISE PROGRAM: TBD  ASSESSMENT:  CLINICAL IMPRESSION: Over the past period she has been able to increase her LE strength, has been able walk further, has been able to do some standing and have better posture.  She went from 28' to 69 feet in walking.  She does fatigue and typically cannot go as  far on the second attempt, she remains with signifcicant pain in the left upper trap and neck due to synergy, I may ask if she can talk to her MD about different injections (Botox) OBJECTIVE IMPAIRMENTS: Abnormal gait, cardiopulmonary status limiting activity, decreased activity tolerance, decreased balance, decreased coordination, decreased endurance, decreased mobility, difficulty walking, decreased ROM, decreased strength, increased edema, impaired flexibility, improper body mechanics, postural dysfunction, and pain.   REHAB POTENTIAL: Good  CLINICAL DECISION MAKING: Stable/uncomplicated  EVALUATION COMPLEXITY: Low   GOALS: Goals reviewed with patient? Yes  SHORT TERM GOALS: Target date: 06/02/23 Independent with advanced HEP Goal status: met 06/07/23  LONG TERM GOALS: Target date: 08/21/23  Independent with advanced HEP with caregiver Goal status: ongoing 09/06/23  2.  Transfer with set up and CGA Goal status: Ongoing 09/25/23 min/mod A  3.  Walk HHA x 100 feet Goal status: Progressing 09/25/23 69'  4.  Increase left LE strength to 4/5 Goal status: Met 07/31/23  5.  Report neck and shoulder pain decreased 50% Goal status: Progressing 30% 09/06/23  PLAN:  PT FREQUENCY: 1-2x/week  PT DURATION: 12 weeks  PLANNED INTERVENTIONS: Therapeutic exercises, Therapeutic activity, Neuromuscular re-education, Balance training, Gait training, Patient/Family education, Self Care, Joint mobilization, Moist heat, Taping, and Manual therapy  PLAN FOR NEXT SESSION:  renewal performed   Jearld Lesch, PT

## 2023-09-27 ENCOUNTER — Encounter: Payer: Self-pay | Admitting: Physical Therapy

## 2023-09-27 ENCOUNTER — Ambulatory Visit: Payer: Medicare Other | Admitting: Physical Therapy

## 2023-09-27 DIAGNOSIS — M542 Cervicalgia: Secondary | ICD-10-CM

## 2023-09-27 DIAGNOSIS — R262 Difficulty in walking, not elsewhere classified: Secondary | ICD-10-CM

## 2023-09-27 DIAGNOSIS — M25512 Pain in left shoulder: Secondary | ICD-10-CM | POA: Diagnosis not present

## 2023-09-27 DIAGNOSIS — M25572 Pain in left ankle and joints of left foot: Secondary | ICD-10-CM

## 2023-09-27 DIAGNOSIS — R270 Ataxia, unspecified: Secondary | ICD-10-CM

## 2023-09-27 DIAGNOSIS — I69354 Hemiplegia and hemiparesis following cerebral infarction affecting left non-dominant side: Secondary | ICD-10-CM | POA: Diagnosis not present

## 2023-09-27 DIAGNOSIS — I6990 Unspecified sequelae of unspecified cerebrovascular disease: Secondary | ICD-10-CM

## 2023-09-27 DIAGNOSIS — M6281 Muscle weakness (generalized): Secondary | ICD-10-CM | POA: Diagnosis not present

## 2023-09-27 NOTE — Therapy (Signed)
OUTPATIENT PHYSICAL THERAPY LOWER EXTREMITY TREATMENT     Patient Name: Karen Jackson MRN: [id number] DOB:April 30, 1941, 83 y.o., female Today's Date: 09/27/2023  END OF SESSION:  PT End of Session - 09/27/23 1117     Visit Number 36    Date for PT Re-Evaluation 10/26/23    Authorization Type Medicare 9 this year    PT Start Time 1055    PT Stop Time 1143    PT Time Calculation (min) 48 min    Activity Tolerance Patient tolerated treatment well    Behavior During Therapy WFL for tasks assessed/performed             Past Medical History:  Diagnosis Date   Acute cystitis without hematuria    Acute diastolic CHF (congestive heart failure) (HCC)    Arthritis    Dyspnea    Dysrhythmia    Fever of unknown origin 03/19/2017   Hyperlipidemia    Hypertension    denies at preop   Multifocal pneumonia    Neuromuscular disorder (HCC)    neuropathy left arm and foot   Osteopenia    Paralysis (HCC)    partial left side from CVA    Persistent atrial fibrillation (HCC)    PONV (postoperative nausea and vomiting)    Pre-diabetes    Stroke [facility]) 2013   hemmorahgic   Past Surgical History:  Procedure Laterality Date   ANKLE SURGERY     APPENDECTOMY     CHOLECYSTECTOMY     HARDWARE REMOVAL Left 03/29/2023   Procedure: HARDWARE REMOVAL;  Surgeon: Toni Arthurs, MD;  Location: [facility];  Service: Orthopedics;  Laterality: Left;   HERNIA REPAIR     Esophagus   INCISION AND DRAINAGE OF WOUND Left 03/29/2023   Procedure: LEFT ANKLE WOUND IRRIGATION AND DEBRIDEMENT WOUND;  Surgeon: Toni Arthurs, MD;  Location: [facility];  Service: Orthopedics;  Laterality: Left;   JOINT REPLACEMENT     total- right partial- left   MASTECTOMY PARTIAL / LUMPECTOMY  2012   left   ORIF ANKLE FRACTURE Left 07/20/2018   Procedure: OPEN REDUCTION INTERNAL FIXATION (ORIF) ANKLE FRACTURE;  Surgeon: Toni Arthurs, MD;  Location: [facility];  Service: Orthopedics;  Laterality: Left;    TOTAL KNEE ARTHROPLASTY Left 01/27/2019   Procedure: TOTAL KNEE ARTHROPLASTY;  Surgeon: Dannielle Huh, MD;  Location: [facility];  Service: Orthopedics;  Laterality: Left;   Patient Active Problem List   Diagnosis Date Noted   Goals of care, counseling/discussion 01/17/2022   Grief 01/17/2022   Secondary hypercoagulable state (HCC) 03/16/2021   Dizziness 03/10/2021   Orthostatic hypotension 10/15/2020   Presbycusis of both ears 03/08/2020   Mixed stress and urge urinary incontinence 12/02/2019   Macrocytosis 12/01/2019   Nutritional anemia 12/01/2019   S/P total knee replacement 01/27/2019   Recurrent left knee instability 07/05/2018   Respiratory failure with hypoxia (HCC) 08/30/2017   Hypoxemia    Heart failure with preserved ejection fraction (HCC), Grade 3 diastolic dysfunction 03/26/2017   PAF (paroxysmal atrial fibrillation) (HCC)    Dyspnea 03/19/2017   Encounter for preventive health examination 02/17/2016   Sensorineural hearing loss (SNHL), bilateral 01/26/2016   Hypomagnesemia 04/24/2014   Hemiparesis affecting left side as late effect of cerebrovascular accident (HCC) 04/24/2014   Nontraumatic cerebral hemorrhage (HCC) 04/30/2012   DM (diabetes mellitus) with complications (HCC) 03/04/2010   OSTEOPENIA 01/21/2009   UNSPECIFIED VITAMIN D DEFICIENCY 11/19/2007   HYPERCHOLESTEROLEMIA 10/25/2006   GASTROESOPHAGEAL REFLUX, NO ESOPHAGITIS 10/25/2006  DIVERTICULOSIS OF COLON 10/25/2006   Osteoarthritis 10/25/2006   CERVICAL SPINE DISORDER, NOS 10/25/2006    PCP: Waynard Edwards, MD  REFERRING PROVIDER: Waynard Edwards, MD  REFERRING DIAG: s/p left ankle ORIF hardware removal  THERAPY DIAG:  Muscle weakness (generalized)  Left shoulder pain, unspecified chronicity  Pain in left ankle and joints of left foot  Difficulty in walking, not elsewhere classified  Cervicalgia  Late effects of CVA (cerebrovascular accident)  Hemiplegia and hemiparesis following cerebral infarction affecting  left non-dominant side (HCC)  Ataxia  Rationale for Evaluation and Treatment: Rehabilitation  ONSET DATE: 03/29/23  SUBJECTIVE:   SUBJECTIVE STATEMENT: Feeling okay, a little ankle pain PERTINENT HISTORY: CVA, CHF, HTN, A-fib, TKA PAIN:  Are you having pain? Yes: NPRS scale: 6/10 Pain location: left upper trap and neck, left ankle and knee  Pain description: ache sore Aggravating factors: neck and shoulder always hurt, knee and ankle hurt with transfers Relieving factors: massage, heat  PRECAUTIONS: None  RED FLAGS: None   WEIGHT BEARING RESTRICTIONS: No  FALLS:  Has patient fallen in last 6 months? No  LIVING ENVIRONMENT: Lives with: lives alone Lives in: House/apartment Stairs: No Has following equipment at home: Environmental consultant - 2 wheeled, Wheelchair (manual), shower chair, bed side commode, Grab bars, and Ramped entry  OCCUPATION: retired  PLOF: Needs assistance with homemaking, Needs assistance with transfers, and Leisure: plays bridge, she has 11 hours of an aide at home that helps with meals, going to MD's, dressing and bathing, is alone at night  PATIENT GOALS: walk, have less pain, transfer without difficulty  NEXT MD VISIT: none scheduled  OBJECTIVE:   DIAGNOSTIC FINDINGS: none performed  COGNITION: Overall cognitive status: Within functional limits for tasks assessed     SENSATION: WFL POSTURE: rounded shoulders and forward head  PALPATION: Left knee and ankle are swollen and tender, left upper and neck  LOWER EXTREMITY ROM:  Active ROM Right eval Left eval  Left 07/31/23 Left 08/16/23  Hip flexion       Hip extension       Hip abduction       Hip adduction       Hip internal rotation       Hip external rotation       Knee flexion  90 93 96 95  Knee extension  0 0 0 0  Ankle dorsiflexion  5     Ankle plantarflexion  30 44 49 52  Ankle inversion  5     Ankle eversion  0      (Blank rows = not tested)  LOWER EXTREMITY MMT:  MMT Right eval  Left eval Left 06/21/23 Left 07/31/23 08/23/23 left 09/25/23  Hip flexion 4- 3+ 4- 4 4- 4-  Hip extension        Hip abduction 4 3+ 4- 4+ 4 4  Hip adduction 4 3+ 4- 4 4- 4  Hip internal rotation        Hip external rotation        Knee flexion 4- 3+ 4 4 4+ 4+  Knee extension 4- 3+ 4- 4 4 4   Ankle dorsiflexion     4- 4  Ankle plantarflexion     4- 4  Ankle inversion        Ankle eversion         (Blank rows = not tested)  FUNCTIONAL TESTS:  Transfers Max A, left knee gives out and goes into ER and tends to bow into varus, has not walked  in about 18 months, and the last time I saw her we could walk about 60 feet with HHA and w/c following  GAIT: Distance walked: unable   TODAY'S TREATMENT:                                                                                                                              DATE: 09/27/23 5# LAQ 5# march [place] Si tband HS curl Green tband hip adduction Nustep level 5 x 10 minutes asked her to do 600 steps Gait with HHA and w/c follow x 10 feet, standing weight shifts Ankle brace adjustment  09/25/23 Nustep level 5 x 8 minutes STM to the left upper trap, rhomboid and neck Weighed her at 147# with two people helping Gait with w/c follow and HHA 69 feet, rest and then 60 feet Green tband HS curls Green tband ankle exercises Red tband hip adduction Red tband hip clamshells 5# LAQ 5# marches Standing weight shifts and orking on posture  09/20/23 Green tband ankle all motions green tband hip abduction and adduction, focus on adduction increased reps and sets green tband HS curls Nustep level 5 x 500 steps 8 minutes, more difficulty today keeping the knee in, required cues STM to the left and right upper trap and neck area due to pain and tightness Gait with HHA and w/c follow 63', then repeated and did 52'  09/18/23 STM to the upper traps and into the rhomboids Passive stretch left ankle DF Nustep level 5 x 6:40 500 steps Standing  weight shifts Green tband HS curls difficult for her Green tband ankle PF/DF Green tband hip adduction very difficult Gait with HHA and w/c follow x 60', rest then 48 ' 5# LAQ 2.5# marches  09/12/22 NuStep L5 x 7:39 min 500 steps  STM with t-gun to the upper traps and neck Red tband hip adduction Hip Abd LLE red 2x15  Red tband HS curl LLE  Red tband PF/DF LLE 5lb march LLE LAQ 5lb 2x10 Approximation to L digits Gait with HHA and w/c follow 55', stops due to fatigue  09/11/23 NuStep L5 x 6 min STM with t-gun to the upper traps and neck Gait with HHA and w/c follow 88' and 46', stops due to fatigue 5# LAQ Red tband PF/DF Red tband HS curl Red tband hip adduction Approximation to L digits  09/06/23 Nustep level 5 x 8 minutes STM with t-gun to the upper traps and neck Gait with HHA and w/c follow x20 feet stopped due to left knee pain Standing weight shifts and had her bend and flex the knee multiple times, rest and repeat Then was able to walk with less pain x 45 feet  5# LAQ Red tband PF/DF Red tband HS curl Red tband hip adduction  09/04/23 Nustp level 5 x 7 minutes 5# marches 5# LAQ Gait with HHA and w/c follow 2x 60 feet Red tand HA curls Red tband hip adduction and abduction  left only Red tband ankle PF/DF STM to the left upper trap and into the neck Stnading weight shifts  08/30/23 Red tband ankle PF/DF Red tband hip adduction and abduction Red HS curls 5# LAQ 3# marches STM to the left upper trap and neck Nustep level 5 x 8 minutes Walking with HHA and w/c follow x 40 feet stopped due to left medial knee pain Standing weight shift trying to help with the knee pain    PATIENT EDUCATION:  Education details: POC Person educated: Patient Education method: Explanation Education comprehension: verbalized understanding  HOME EXERCISE PROGRAM: TBD  ASSESSMENT:  CLINICAL IMPRESSION: Patient had some increased left ankle and knee pain today.  We were  not able to walk much, I took her brace off and adjusted it, the padding had slid forward, this was the issue that caused her to have a wound that lasted a year.  I feel like I got it where it needs to be but it is only held in place with a piece of velcro. OBJECTIVE IMPAIRMENTS: Abnormal gait, cardiopulmonary status limiting activity, decreased activity tolerance, decreased balance, decreased coordination, decreased endurance, decreased mobility, difficulty walking, decreased ROM, decreased strength, increased edema, impaired flexibility, improper body mechanics, postural dysfunction, and pain.   REHAB POTENTIAL: Good  CLINICAL DECISION MAKING: Stable/uncomplicated  EVALUATION COMPLEXITY: Low   GOALS: Goals reviewed with patient? Yes  SHORT TERM GOALS: Target date: 06/02/23 Independent with advanced HEP Goal status: met 06/07/23  LONG TERM GOALS: Target date: 08/21/23  Independent with advanced HEP with caregiver Goal status: ongoing 09/06/23  2.  Transfer with set up and CGA Goal status: Ongoing 09/25/23 min/mod A  3.  Walk HHA x 100 feet Goal status: Progressing 09/25/23 69'  4.  Increase left LE strength to 4/5 Goal status: Met 07/31/23  5.  Report neck and shoulder pain decreased 50% Goal status: Progressing 30% 09/06/23  PLAN:  PT FREQUENCY: 1-2x/week  PT DURATION: 12 weeks  PLANNED INTERVENTIONS: Therapeutic exercises, Therapeutic activity, Neuromuscular re-education, Balance training, Gait training, Patient/Family education, Self Care, Joint mobilization, Moist heat, Taping, and Manual therapy  PLAN FOR NEXT SESSION:  renewal performed   Jearld Lesch, PT

## 2023-10-02 ENCOUNTER — Ambulatory Visit: Payer: Medicare Other | Attending: Internal Medicine | Admitting: Physical Therapy

## 2023-10-02 ENCOUNTER — Encounter: Payer: Self-pay | Admitting: Physical Therapy

## 2023-10-02 DIAGNOSIS — M6281 Muscle weakness (generalized): Secondary | ICD-10-CM | POA: Insufficient documentation

## 2023-10-02 DIAGNOSIS — M25512 Pain in left shoulder: Secondary | ICD-10-CM | POA: Insufficient documentation

## 2023-10-02 DIAGNOSIS — I6990 Unspecified sequelae of unspecified cerebrovascular disease: Secondary | ICD-10-CM | POA: Diagnosis not present

## 2023-10-02 DIAGNOSIS — R293 Abnormal posture: Secondary | ICD-10-CM | POA: Diagnosis not present

## 2023-10-02 DIAGNOSIS — M542 Cervicalgia: Secondary | ICD-10-CM | POA: Insufficient documentation

## 2023-10-02 DIAGNOSIS — R270 Ataxia, unspecified: Secondary | ICD-10-CM | POA: Diagnosis not present

## 2023-10-02 DIAGNOSIS — I69354 Hemiplegia and hemiparesis following cerebral infarction affecting left non-dominant side: Secondary | ICD-10-CM | POA: Insufficient documentation

## 2023-10-02 DIAGNOSIS — M25572 Pain in left ankle and joints of left foot: Secondary | ICD-10-CM | POA: Diagnosis not present

## 2023-10-02 DIAGNOSIS — R262 Difficulty in walking, not elsewhere classified: Secondary | ICD-10-CM | POA: Diagnosis not present

## 2023-10-02 NOTE — Therapy (Signed)
 OUTPATIENT PHYSICAL THERAPY LOWER EXTREMITY TREATMENT     Patient Name: Karen Jackson MRN: [id number] DOB:1941/07/19, 83 y.o., female Today's Date: 10/02/2023  END OF SESSION:  PT End of Session - 10/02/23 1100     Visit Number 37    Date for PT Re-Evaluation 10/26/23    PT Start Time 1100    PT Stop Time 1145    PT Time Calculation (min) 45 min    Activity Tolerance Patient tolerated treatment well    Behavior During Therapy [facility] for tasks assessed/performed             Past Medical History:  Diagnosis Date   Acute cystitis without hematuria    Acute diastolic CHF (congestive heart failure) (HCC)    Arthritis    Dyspnea    Dysrhythmia    Fever of unknown origin 03/19/2017   Hyperlipidemia    Hypertension    denies at preop   Multifocal pneumonia    Neuromuscular disorder (HCC)    neuropathy left arm and foot   Osteopenia    Paralysis (HCC)    partial left side from CVA    Persistent atrial fibrillation (HCC)    PONV (postoperative nausea and vomiting)    Pre-diabetes    Stroke [facility]) 2013   hemmorahgic   Past Surgical History:  Procedure Laterality Date   ANKLE SURGERY     APPENDECTOMY     CHOLECYSTECTOMY     HARDWARE REMOVAL Left 03/29/2023   Procedure: HARDWARE REMOVAL;  Surgeon: Kit Rush, MD;  Location: [facility];  Service: Orthopedics;  Laterality: Left;   HERNIA REPAIR     Esophagus   INCISION AND DRAINAGE OF WOUND Left 03/29/2023   Procedure: LEFT ANKLE WOUND IRRIGATION AND DEBRIDEMENT WOUND;  Surgeon: Kit Rush, MD;  Location: [facility];  Service: Orthopedics;  Laterality: Left;   JOINT REPLACEMENT     total- right partial- left   MASTECTOMY PARTIAL / LUMPECTOMY  2012   left   ORIF ANKLE FRACTURE Left 07/20/2018   Procedure: OPEN REDUCTION INTERNAL FIXATION (ORIF) ANKLE FRACTURE;  Surgeon: Kit Rush, MD;  Location: [facility];  Service: Orthopedics;  Laterality: Left;   TOTAL KNEE ARTHROPLASTY Left 01/27/2019    Procedure: TOTAL KNEE ARTHROPLASTY;  Surgeon: Rubie Kemps, MD;  Location: [facility];  Service: Orthopedics;  Laterality: Left;   Patient Active Problem List   Diagnosis Date Noted   Goals of care, counseling/discussion 01/17/2022   Grief 01/17/2022   Secondary hypercoagulable state (HCC) 03/16/2021   Dizziness 03/10/2021   Orthostatic hypotension 10/15/2020   Presbycusis of both ears 03/08/2020   Mixed stress and urge urinary incontinence 12/02/2019   Macrocytosis 12/01/2019   Nutritional anemia 12/01/2019   S/P total knee replacement 01/27/2019   Recurrent left knee instability 07/05/2018   Respiratory failure with hypoxia (HCC) 08/30/2017   Hypoxemia    Heart failure with preserved ejection fraction (HCC), Grade 3 diastolic dysfunction 03/26/2017   PAF (paroxysmal atrial fibrillation) (HCC)    Dyspnea 03/19/2017   Encounter for preventive health examination 02/17/2016   Sensorineural hearing loss (SNHL), bilateral 01/26/2016   Hypomagnesemia 04/24/2014   Hemiparesis affecting left side as late effect of cerebrovascular accident (HCC) 04/24/2014   Nontraumatic cerebral hemorrhage (HCC) 04/30/2012   DM (diabetes mellitus) with complications (HCC) 03/04/2010   OSTEOPENIA 01/21/2009   UNSPECIFIED VITAMIN D  DEFICIENCY 11/19/2007   HYPERCHOLESTEROLEMIA 10/25/2006   GASTROESOPHAGEAL REFLUX, NO ESOPHAGITIS 10/25/2006   DIVERTICULOSIS OF COLON 10/25/2006   Osteoarthritis  10/25/2006   CERVICAL SPINE DISORDER, NOS 10/25/2006    PCP: Shayne, MD  REFERRING PROVIDER: Shayne, MD  REFERRING DIAG: s/p left ankle ORIF hardware removal  THERAPY DIAG:  Muscle weakness (generalized)  Left shoulder pain, unspecified chronicity  Pain in left ankle and joints of left foot  Difficulty in walking, not elsewhere classified  Rationale for Evaluation and Treatment: Rehabilitation  ONSET DATE: 03/29/23  SUBJECTIVE:   SUBJECTIVE STATEMENT: Knees and ankle are about a 5, shoulder about  8 PERTINENT HISTORY: CVA, CHF, HTN, A-fib, TKA PAIN:  Are you having pain? Yes: NPRS scale: 6/10 Pain location: left upper trap and neck, left ankle and knee  Pain description: ache sore Aggravating factors: neck and shoulder always hurt, knee and ankle hurt with transfers Relieving factors: massage, heat  PRECAUTIONS: None  RED FLAGS: None   WEIGHT BEARING RESTRICTIONS: No  FALLS:  Has patient fallen in last 6 months? No  LIVING ENVIRONMENT: Lives with: lives alone Lives in: House/apartment Stairs: No Has following equipment at home: Environmental Consultant - 2 wheeled, Wheelchair (manual), shower chair, bed side commode, Grab bars, and Ramped entry  OCCUPATION: retired  PLOF: Needs assistance with homemaking, Needs assistance with transfers, and Leisure: plays bridge, she has 11 hours of an aide at home that helps with meals, going to MD's, dressing and bathing, is alone at night  PATIENT GOALS: walk, have less pain, transfer without difficulty  NEXT MD VISIT: none scheduled  OBJECTIVE:   DIAGNOSTIC FINDINGS: none performed  COGNITION: Overall cognitive status: Within functional limits for tasks assessed     SENSATION: WFL POSTURE: rounded shoulders and forward head  PALPATION: Left knee and ankle are swollen and tender, left upper and neck  LOWER EXTREMITY ROM:  Active ROM Right eval Left eval  Left 07/31/23 Left 08/16/23  Hip flexion       Hip extension       Hip abduction       Hip adduction       Hip internal rotation       Hip external rotation       Knee flexion  90 93 96 95  Knee extension  0 0 0 0  Ankle dorsiflexion  5     Ankle plantarflexion  30 44 49 52  Ankle inversion  5     Ankle eversion  0      (Blank rows = not tested)  LOWER EXTREMITY MMT:  MMT Right eval Left eval Left 06/21/23 Left 07/31/23 08/23/23 left 09/25/23  Hip flexion 4- 3+ 4- 4 4- 4-  Hip extension        Hip abduction 4 3+ 4- 4+ 4 4  Hip adduction 4 3+ 4- 4 4- 4  Hip internal  rotation        Hip external rotation        Knee flexion 4- 3+ 4 4 4+ 4+  Knee extension 4- 3+ 4- 4 4 4   Ankle dorsiflexion     4- 4  Ankle plantarflexion     4- 4  Ankle inversion        Ankle eversion         (Blank rows = not tested)  FUNCTIONAL TESTS:  Transfers Max A, left knee gives out and goes into ER and tends to bow into varus, has not walked in about 18 months, and the last time I saw her we could walk about 60 feet with HHA and w/c following  GAIT: Distance walked:  unable   TODAY'S TREATMENT:                                                                                                                              DATE: 10/02/23 STM with t-gun to the upper traps and neck Approximation to L digits NuStep 7 min L5 526 steps 5# LAQ 5# march HS curls green 2x10 Hip add green  R ankle DF and PF green x 10 Gait with w/c follow and HHA 70 feet  09/27/23 5# LAQ 5# march Green tband HS curl Green tband hip adduction Nustep level 5 x 10 minutes asked her to do 600 steps Gait with HHA and w/c follow x 10 feet, standing weight shifts Ankle brace adjustment  09/25/23 Nustep level 5 x 8 minutes STM to the left upper trap, rhomboid and neck Weighed her at 147# with two people helping Gait with w/c follow and HHA 69 feet, rest and then 60 feet Green tband HS curls Green tband ankle exercises Red tband hip adduction Red tband hip clamshells 5# LAQ 5# marches Standing weight shifts and orking on posture  09/20/23 Green tband ankle all motions green tband hip abduction and adduction, focus on adduction increased reps and sets green tband HS curls Nustep level 5 x 500 steps 8 minutes, more difficulty today keeping the knee in, required cues STM to the left and right upper trap and neck area due to pain and tightness Gait with HHA and w/c follow 63', then repeated and did 52'  09/18/23 STM to the upper traps and into the rhomboids Passive stretch left ankle DF Nustep  level 5 x 6:40 500 steps Standing weight shifts Green tband HS curls difficult for her Green tband ankle PF/DF Green tband hip adduction very difficult Gait with HHA and w/c follow x 60', rest then 48 ' 5# LAQ 2.5# marches  09/12/22 NuStep L5 x 7:39 min 500 steps  STM with t-gun to the upper traps and neck Red tband hip adduction Hip Abd LLE red 2x15  Red tband HS curl LLE  Red tband PF/DF LLE 5lb march LLE LAQ 5lb 2x10 Approximation to L digits Gait with HHA and w/c follow 55', stops due to fatigue  09/11/23 NuStep L5 x 6 min STM with t-gun to the upper traps and neck Gait with HHA and w/c follow 13' and 46', stops due to fatigue 5# LAQ Red tband PF/DF Red tband HS curl Red tband hip adduction Approximation to L digits  09/06/23 Nustep level 5 x 8 minutes STM with t-gun to the upper traps and neck Gait with HHA and w/c follow x20 feet stopped due to left knee pain Standing weight shifts and had her bend and flex the knee multiple times, rest and repeat Then was able to walk with less pain x 45 feet  5# LAQ Red tband PF/DF Red tband HS curl Red tband hip adduction  09/04/23 Nustp level 5 x 7 minutes  5# marches 5# LAQ Gait with HHA and w/c follow 2x 60 feet Red tand HA curls Red tband hip adduction and abduction left only Red tband ankle PF/DF STM to the left upper trap and into the neck Stnading weight shifts  08/30/23 Red tband ankle PF/DF Red tband hip adduction and abduction Red HS curls 5# LAQ 3# marches STM to the left upper trap and neck Nustep level 5 x 8 minutes Walking with HHA and w/c follow x 40 feet stopped due to left medial knee pain Standing weight shift trying to help with the knee pain    PATIENT EDUCATION:  Education details: POC Person educated: Patient Education method: Explanation Education comprehension: verbalized understanding  HOME EXERCISE PROGRAM: TBD  ASSESSMENT:  CLINICAL IMPRESSION: Patient enters with some increased  left ankle, knee, and shoulder pain today. Pt able to tolerated gait better today increasing her distance, decrease step length noted with RLE.  Cues for full Rom needed with LAQ and HS curls. No reports of increase pain during session  OBJECTIVE IMPAIRMENTS: Abnormal gait, cardiopulmonary status limiting activity, decreased activity tolerance, decreased balance, decreased coordination, decreased endurance, decreased mobility, difficulty walking, decreased ROM, decreased strength, increased edema, impaired flexibility, improper body mechanics, postural dysfunction, and pain.   REHAB POTENTIAL: Good  CLINICAL DECISION MAKING: Stable/uncomplicated  EVALUATION COMPLEXITY: Low   GOALS: Goals reviewed with patient? Yes  SHORT TERM GOALS: Target date: 06/02/23 Independent with advanced HEP Goal status: met 06/07/23  LONG TERM GOALS: Target date: 08/21/23  Independent with advanced HEP with caregiver Goal status: ongoing 09/06/23  2.  Transfer with set up and CGA Goal status: Ongoing 09/25/23 min/mod A  3.  Walk HHA x 100 feet Goal status: Progressing 09/25/23 69'  4.  Increase left LE strength to 4/5 Goal status: Met 07/31/23  5.  Report neck and shoulder pain decreased 50% Goal status: Progressing 30% 09/06/23  PLAN:  PT FREQUENCY: 1-2x/week  PT DURATION: 12 weeks  PLANNED INTERVENTIONS: Therapeutic exercises, Therapeutic activity, Neuromuscular re-education, Balance training, Gait training, Patient/Family education, Self Care, Joint mobilization, Moist heat, Taping, and Manual therapy  PLAN FOR NEXT SESSION:  renewal performed   Tanda KANDICE Sorrow, PTA

## 2023-10-04 ENCOUNTER — Encounter: Payer: Self-pay | Admitting: Physical Therapy

## 2023-10-04 ENCOUNTER — Ambulatory Visit: Payer: Medicare Other | Admitting: Physical Therapy

## 2023-10-04 DIAGNOSIS — M25572 Pain in left ankle and joints of left foot: Secondary | ICD-10-CM

## 2023-10-04 DIAGNOSIS — M542 Cervicalgia: Secondary | ICD-10-CM

## 2023-10-04 DIAGNOSIS — R262 Difficulty in walking, not elsewhere classified: Secondary | ICD-10-CM | POA: Diagnosis not present

## 2023-10-04 DIAGNOSIS — M25512 Pain in left shoulder: Secondary | ICD-10-CM

## 2023-10-04 DIAGNOSIS — I6990 Unspecified sequelae of unspecified cerebrovascular disease: Secondary | ICD-10-CM

## 2023-10-04 DIAGNOSIS — R293 Abnormal posture: Secondary | ICD-10-CM

## 2023-10-04 DIAGNOSIS — I69354 Hemiplegia and hemiparesis following cerebral infarction affecting left non-dominant side: Secondary | ICD-10-CM

## 2023-10-04 DIAGNOSIS — M6281 Muscle weakness (generalized): Secondary | ICD-10-CM | POA: Diagnosis not present

## 2023-10-04 DIAGNOSIS — R270 Ataxia, unspecified: Secondary | ICD-10-CM

## 2023-10-04 NOTE — Therapy (Signed)
 OUTPATIENT PHYSICAL THERAPY LOWER EXTREMITY TREATMENT     Patient Name: Karen Jackson MRN: [id number] DOB:03-Nov-1940, 83 y.o., female Today's Date: 10/04/2023  END OF SESSION:  PT End of Session - 10/04/23 1108     Visit Number 38    Date for PT Re-Evaluation 10/26/23    Authorization Type Medicare 10 this year    PT Start Time 1100    PT Stop Time 1147    PT Time Calculation (min) 47 min    Activity Tolerance Patient tolerated treatment well    Behavior During Therapy WFL for tasks assessed/performed             Past Medical History:  Diagnosis Date   Acute cystitis without hematuria    Acute diastolic CHF (congestive heart failure) (HCC)    Arthritis    Dyspnea    Dysrhythmia    Fever of unknown origin 03/19/2017   Hyperlipidemia    Hypertension    denies at preop   Multifocal pneumonia    Neuromuscular disorder (HCC)    neuropathy left arm and foot   Osteopenia    Paralysis (HCC)    partial left side from CVA    Persistent atrial fibrillation (HCC)    PONV (postoperative nausea and vomiting)    Pre-diabetes    Stroke [facility]) 2013   hemmorahgic   Past Surgical History:  Procedure Laterality Date   ANKLE SURGERY     APPENDECTOMY     CHOLECYSTECTOMY     HARDWARE REMOVAL Left 03/29/2023   Procedure: HARDWARE REMOVAL;  Surgeon: Kit Rush, MD;  Location: [facility];  Service: Orthopedics;  Laterality: Left;   HERNIA REPAIR     Esophagus   INCISION AND DRAINAGE OF WOUND Left 03/29/2023   Procedure: LEFT ANKLE WOUND IRRIGATION AND DEBRIDEMENT WOUND;  Surgeon: Kit Rush, MD;  Location:  SURGERY CENTER;  Service: Orthopedics;  Laterality: Left;   JOINT REPLACEMENT     total- right partial- left   MASTECTOMY PARTIAL / LUMPECTOMY  2012   left   ORIF ANKLE FRACTURE Left 07/20/2018   Procedure: OPEN REDUCTION INTERNAL FIXATION (ORIF) ANKLE FRACTURE;  Surgeon: Kit Rush, MD;  Location: [facility];  Service: Orthopedics;  Laterality: Left;    TOTAL KNEE ARTHROPLASTY Left 01/27/2019   Procedure: TOTAL KNEE ARTHROPLASTY;  Surgeon: Rubie Kemps, MD;  Location: [facility];  Service: Orthopedics;  Laterality: Left;   Patient Active Problem List   Diagnosis Date Noted   Goals of care, counseling/discussion 01/17/2022   Grief 01/17/2022   Secondary hypercoagulable state (HCC) 03/16/2021   Dizziness 03/10/2021   Orthostatic hypotension 10/15/2020   Presbycusis of both ears 03/08/2020   Mixed stress and urge urinary incontinence 12/02/2019   Macrocytosis 12/01/2019   Nutritional anemia 12/01/2019   S/P total knee replacement 01/27/2019   Recurrent left knee instability 07/05/2018   Respiratory failure with hypoxia (HCC) 08/30/2017   Hypoxemia    Heart failure with preserved ejection fraction (HCC), Grade 3 diastolic dysfunction 03/26/2017   PAF (paroxysmal atrial fibrillation) (HCC)    Dyspnea 03/19/2017   Encounter for preventive health examination 02/17/2016   Sensorineural hearing loss (SNHL), bilateral 01/26/2016   Hypomagnesemia 04/24/2014   Hemiparesis affecting left side as late effect of cerebrovascular accident (HCC) 04/24/2014   Nontraumatic cerebral hemorrhage (HCC) 04/30/2012   DM (diabetes mellitus) with complications (HCC) 03/04/2010   OSTEOPENIA 01/21/2009   UNSPECIFIED VITAMIN D  DEFICIENCY 11/19/2007   HYPERCHOLESTEROLEMIA 10/25/2006   GASTROESOPHAGEAL REFLUX, NO ESOPHAGITIS 10/25/2006  DIVERTICULOSIS OF COLON 10/25/2006   Osteoarthritis 10/25/2006   CERVICAL SPINE DISORDER, NOS 10/25/2006    PCP: Shayne, MD  REFERRING PROVIDER: Shayne, MD  REFERRING DIAG: s/p left ankle ORIF hardware removal  THERAPY DIAG:  Muscle weakness (generalized)  Left shoulder pain, unspecified chronicity  Pain in left ankle and joints of left foot  Difficulty in walking, not elsewhere classified  Cervicalgia  Late effects of CVA (cerebrovascular accident)  Hemiplegia and hemiparesis following cerebral infarction affecting  left non-dominant side (HCC)  Ataxia  Abnormal posture  Rationale for Evaluation and Treatment: Rehabilitation  ONSET DATE: 03/29/23  SUBJECTIVE:   SUBJECTIVE STATEMENT: She has a new ankle brace that she brought in, it needed to be put together. PERTINENT HISTORY: CVA, CHF, HTN, A-fib, TKA PAIN:  Are you having pain? Yes: NPRS scale: 6/10 Pain location: left upper trap and neck, left ankle and knee  Pain description: ache sore Aggravating factors: neck and shoulder always hurt, knee and ankle hurt with transfers Relieving factors: massage, heat  PRECAUTIONS: None  RED FLAGS: None   WEIGHT BEARING RESTRICTIONS: No  FALLS:  Has patient fallen in last 6 months? No  LIVING ENVIRONMENT: Lives with: lives alone Lives in: House/apartment Stairs: No Has following equipment at home: Environmental Consultant - 2 wheeled, Wheelchair (manual), shower chair, bed side commode, Grab bars, and Ramped entry  OCCUPATION: retired  PLOF: Needs assistance with homemaking, Needs assistance with transfers, and Leisure: plays bridge, she has 11 hours of an aide at home that helps with meals, going to MD's, dressing and bathing, is alone at night  PATIENT GOALS: walk, have less pain, transfer without difficulty  NEXT MD VISIT: none scheduled  OBJECTIVE:   DIAGNOSTIC FINDINGS: none performed  COGNITION: Overall cognitive status: Within functional limits for tasks assessed     SENSATION: WFL POSTURE: rounded shoulders and forward head  PALPATION: Left knee and ankle are swollen and tender, left upper and neck  LOWER EXTREMITY ROM:  Active ROM Right eval Left eval  Left 07/31/23 Left 08/16/23  Hip flexion       Hip extension       Hip abduction       Hip adduction       Hip internal rotation       Hip external rotation       Knee flexion  90 93 96 95  Knee extension  0 0 0 0  Ankle dorsiflexion  5     Ankle plantarflexion  30 44 49 52  Ankle inversion  5     Ankle eversion  0       (Blank rows = not tested)  LOWER EXTREMITY MMT:  MMT Right eval Left eval Left 06/21/23 Left 07/31/23 08/23/23 left 09/25/23  Hip flexion 4- 3+ 4- 4 4- 4-  Hip extension        Hip abduction 4 3+ 4- 4+ 4 4  Hip adduction 4 3+ 4- 4 4- 4  Hip internal rotation        Hip external rotation        Knee flexion 4- 3+ 4 4 4+ 4+  Knee extension 4- 3+ 4- 4 4 4   Ankle dorsiflexion     4- 4  Ankle plantarflexion     4- 4  Ankle inversion        Ankle eversion         (Blank rows = not tested)  FUNCTIONAL TESTS:  Transfers Max A, left knee gives out  and goes into ER and tends to bow into varus, has not walked in about 18 months, and the last time I saw her we could walk about 60 feet with HHA and w/c following  GAIT: Distance walked: unable   TODAY'S TREATMENT:                                                                                                                              DATE: 10/04/23 5# LAQ 5# marches Red tband hip adduction and abduction STM to the left upper trap, rhomboid and neck Red tband HS curls Adjusted the new brace as well as the old brace, both had poor velcro and I tried to put new velcro in, with the goal to protect the malleoli due to her issues in the past, showed and educated her on what to look for to help keep the velcro in place Gait with HHA and w/c follow x 75 feet  10/02/23 STM with t-gun to the upper traps and neck Approximation to L digits NuStep 7 min L5 526 steps 5# LAQ 5# march HS curls green 2x10 Hip add green  R ankle DF and PF green x 10 Gait with w/c follow and HHA 70 feet  09/27/23 5# LAQ 5# march Green tband HS curl Green tband hip adduction Nustep level 5 x 10 minutes asked her to do 600 steps Gait with HHA and w/c follow x 10 feet, standing weight shifts Ankle brace adjustment  09/25/23 Nustep level 5 x 8 minutes STM to the left upper trap, rhomboid and neck Weighed her at 147# with two people helping Gait with w/c follow  and HHA 69 feet, rest and then 60 feet Green tband HS curls Green tband ankle exercises Red tband hip adduction Red tband hip clamshells 5# LAQ 5# marches Standing weight shifts and orking on posture  09/20/23 Green tband ankle all motions green tband hip abduction and adduction, focus on adduction increased reps and sets green tband HS curls Nustep level 5 x 500 steps 8 minutes, more difficulty today keeping the knee in, required cues STM to the left and right upper trap and neck area due to pain and tightness Gait with HHA and w/c follow 63', then repeated and did 52'  09/18/23 STM to the upper traps and into the rhomboids Passive stretch left ankle DF Nustep level 5 x 6:40 500 steps Standing weight shifts Green tband HS curls difficult for her Green tband ankle PF/DF Green tband hip adduction very difficult Gait with HHA and w/c follow x 60', rest then 48 ' 5# LAQ 2.5# marches  09/12/22 NuStep L5 x 7:39 min 500 steps  STM with t-gun to the upper traps and neck Red tband hip adduction Hip Abd LLE red 2x15  Red tband HS curl LLE  Red tband PF/DF LLE 5lb march LLE LAQ 5lb 2x10 Approximation to L digits Gait with HHA and w/c follow 55', stops due to fatigue  09/11/23 NuStep L5 x 6 min STM with t-gun to the upper traps and neck Gait with HHA and w/c follow 10' and 46', stops due to fatigue 5# LAQ Red tband PF/DF Red tband HS curl Red tband hip adduction Approximation to L digits  09/06/23 Nustep level 5 x 8 minutes STM with t-gun to the upper traps and neck Gait with HHA and w/c follow x20 feet stopped due to left knee pain Standing weight shifts and had her bend and flex the knee multiple times, rest and repeat Then was able to walk with less pain x 45 feet  5# LAQ Red tband PF/DF Red tband HS curl Red tband hip adduction  09/04/23 Nustp level 5 x 7 minutes 5# marches 5# LAQ Gait with HHA and w/c follow 2x 60 feet Red tand HA curls Red tband hip adduction  and abduction left only Red tband ankle PF/DF STM to the left upper trap and into the neck Stnading weight shifts  08/30/23 Red tband ankle PF/DF Red tband hip adduction and abduction Red HS curls 5# LAQ 3# marches STM to the left upper trap and neck Nustep level 5 x 8 minutes Walking with HHA and w/c follow x 40 feet stopped due to left medial knee pain Standing weight shift trying to help with the knee pain    PATIENT EDUCATION:  Education details: POC Person educated: Patient Education method: Explanation Education comprehension: verbalized understanding  HOME EXERCISE PROGRAM: TBD  ASSESSMENT:  CLINICAL IMPRESSION: Patient brings in new brace, I adjusted this and also the old brace, added velcro to try to assure that the padding stayed in place and did not expose the plastic and to alleviate any pressure this may cause due to past wound issues on the malleoli.  She was able to walk her farthest 1x today, she did c/o knee pain  OBJECTIVE IMPAIRMENTS: Abnormal gait, cardiopulmonary status limiting activity, decreased activity tolerance, decreased balance, decreased coordination, decreased endurance, decreased mobility, difficulty walking, decreased ROM, decreased strength, increased edema, impaired flexibility, improper body mechanics, postural dysfunction, and pain.   REHAB POTENTIAL: Good  CLINICAL DECISION MAKING: Stable/uncomplicated  EVALUATION COMPLEXITY: Low   GOALS: Goals reviewed with patient? Yes  SHORT TERM GOALS: Target date: 06/02/23 Independent with advanced HEP Goal status: met 06/07/23  LONG TERM GOALS: Target date: 08/21/23  Independent with advanced HEP with caregiver Goal status: progressing 10/04/23  2.  Transfer with set up and CGA Goal status: Ongoing 09/25/23 min/mod A  3.  Walk HHA x 100 feet Goal status: Progressing 09/25/23 69'  4.  Increase left LE strength to 4/5 Goal status: Met 07/31/23  5.  Report neck and shoulder pain decreased  50% Goal status: Progressing 30% 10/04/23  PLAN:  PT FREQUENCY: 1-2x/week  PT DURATION: 12 weeks  PLANNED INTERVENTIONS: Therapeutic exercises, Therapeutic activity, Neuromuscular re-education, Balance training, Gait training, Patient/Family education, Self Care, Joint mobilization, Moist heat, Taping, and Manual therapy  PLAN FOR NEXT SESSION:  continue to progress   OBADIAH OZELL ORN, PT

## 2023-10-09 ENCOUNTER — Ambulatory Visit: Payer: Medicare Other | Admitting: Physical Therapy

## 2023-10-09 ENCOUNTER — Encounter: Payer: Self-pay | Admitting: Physical Therapy

## 2023-10-09 DIAGNOSIS — M6281 Muscle weakness (generalized): Secondary | ICD-10-CM

## 2023-10-09 DIAGNOSIS — M542 Cervicalgia: Secondary | ICD-10-CM

## 2023-10-09 DIAGNOSIS — R293 Abnormal posture: Secondary | ICD-10-CM

## 2023-10-09 DIAGNOSIS — R270 Ataxia, unspecified: Secondary | ICD-10-CM

## 2023-10-09 DIAGNOSIS — M25512 Pain in left shoulder: Secondary | ICD-10-CM | POA: Diagnosis not present

## 2023-10-09 DIAGNOSIS — M25572 Pain in left ankle and joints of left foot: Secondary | ICD-10-CM | POA: Diagnosis not present

## 2023-10-09 DIAGNOSIS — I6990 Unspecified sequelae of unspecified cerebrovascular disease: Secondary | ICD-10-CM

## 2023-10-09 DIAGNOSIS — I69354 Hemiplegia and hemiparesis following cerebral infarction affecting left non-dominant side: Secondary | ICD-10-CM

## 2023-10-09 DIAGNOSIS — R262 Difficulty in walking, not elsewhere classified: Secondary | ICD-10-CM

## 2023-10-09 NOTE — Therapy (Signed)
OUTPATIENT PHYSICAL THERAPY LOWER EXTREMITY TREATMENT     Patient Name: Karen Jackson MRN: [id number] DOB:1941/02/05, 83 y.o., female Today's Date: 10/09/2023  END OF SESSION:  PT End of Session - 10/09/23 1114     Visit Number 39    Date for PT Re-Evaluation 10/26/23    PT Start Time 1057    PT Stop Time 1145    PT Time Calculation (min) 48 min    Activity Tolerance Patient tolerated treatment well    Behavior During Therapy [facility] for tasks assessed/performed             Past Medical History:  Diagnosis Date   Acute cystitis without hematuria    Acute diastolic CHF (congestive heart failure) (HCC)    Arthritis    Dyspnea    Dysrhythmia    Fever of unknown origin 03/19/2017   Hyperlipidemia    Hypertension    denies at preop   Multifocal pneumonia    Neuromuscular disorder (HCC)    neuropathy left arm and foot   Osteopenia    Paralysis (HCC)    partial left side from CVA    Persistent atrial fibrillation (HCC)    PONV (postoperative nausea and vomiting)    Pre-diabetes    Stroke [facility]) 2013   hemmorahgic   Past Surgical History:  Procedure Laterality Date   ANKLE SURGERY     APPENDECTOMY     CHOLECYSTECTOMY     HARDWARE REMOVAL Left 03/29/2023   Procedure: HARDWARE REMOVAL;  Surgeon: Toni Arthurs, MD;  Location: [facility];  Service: Orthopedics;  Laterality: Left;   HERNIA REPAIR     Esophagus   INCISION AND DRAINAGE OF WOUND Left 03/29/2023   Procedure: LEFT ANKLE WOUND IRRIGATION AND DEBRIDEMENT WOUND;  Surgeon: Toni Arthurs, MD;  Location: [facility];  Service: Orthopedics;  Laterality: Left;   JOINT REPLACEMENT     total- right partial- left   MASTECTOMY PARTIAL / LUMPECTOMY  2012   left   ORIF ANKLE FRACTURE Left 07/20/2018   Procedure: OPEN REDUCTION INTERNAL FIXATION (ORIF) ANKLE FRACTURE;  Surgeon: Toni Arthurs, MD;  Location: [facility];  Service: Orthopedics;  Laterality: Left;   TOTAL KNEE ARTHROPLASTY Left 01/27/2019    Procedure: TOTAL KNEE ARTHROPLASTY;  Surgeon: Dannielle Huh, MD;  Location: [facility];  Service: Orthopedics;  Laterality: Left;   Patient Active Problem List   Diagnosis Date Noted   Goals of care, counseling/discussion 01/17/2022   Grief 01/17/2022   Secondary hypercoagulable state (HCC) 03/16/2021   Dizziness 03/10/2021   Orthostatic hypotension 10/15/2020   Presbycusis of both ears 03/08/2020   Mixed stress and urge urinary incontinence 12/02/2019   Macrocytosis 12/01/2019   Nutritional anemia 12/01/2019   S/P total knee replacement 01/27/2019   Recurrent left knee instability 07/05/2018   Respiratory failure with hypoxia (HCC) 08/30/2017   Hypoxemia    Heart failure with preserved ejection fraction (HCC), Grade 3 diastolic dysfunction 03/26/2017   PAF (paroxysmal atrial fibrillation) (HCC)    Dyspnea 03/19/2017   Encounter for preventive health examination 02/17/2016   Sensorineural hearing loss (SNHL), bilateral 01/26/2016   Hypomagnesemia 04/24/2014   Hemiparesis affecting left side as late effect of cerebrovascular accident (HCC) 04/24/2014   Nontraumatic cerebral hemorrhage (HCC) 04/30/2012   DM (diabetes mellitus) with complications (HCC) 03/04/2010   OSTEOPENIA 01/21/2009   UNSPECIFIED VITAMIN D DEFICIENCY 11/19/2007   HYPERCHOLESTEROLEMIA 10/25/2006   GASTROESOPHAGEAL REFLUX, NO ESOPHAGITIS 10/25/2006   DIVERTICULOSIS OF COLON 10/25/2006   Osteoarthritis  10/25/2006   CERVICAL SPINE DISORDER, NOS 10/25/2006    PCP: Waynard Edwards, MD  REFERRING PROVIDER: Waynard Edwards, MD  REFERRING DIAG: s/p left ankle ORIF hardware removal  THERAPY DIAG:  Muscle weakness (generalized)  Left shoulder pain, unspecified chronicity  Pain in left ankle and joints of left foot  Difficulty in walking, not elsewhere classified  Cervicalgia  Late effects of CVA (cerebrovascular accident)  Hemiplegia and hemiparesis following cerebral infarction affecting left non-dominant side  (HCC)  Ataxia  Abnormal posture  Rationale for Evaluation and Treatment: Rehabilitation  ONSET DATE: 03/29/23  SUBJECTIVE:   SUBJECTIVE STATEMENT: She reports that the ankle brace is doing well, still some soreness in the left knee.  Reports caregivers have been telling her how much better she is with transfers PERTINENT HISTORY: CVA, CHF, HTN, A-fib, TKA PAIN:  Are you having pain? Yes: NPRS scale: 6/10 Pain location: left upper trap and neck, left ankle and knee  Pain description: ache sore Aggravating factors: neck and shoulder always hurt, knee and ankle hurt with transfers Relieving factors: massage, heat  PRECAUTIONS: None  RED FLAGS: None   WEIGHT BEARING RESTRICTIONS: No  FALLS:  Has patient fallen in last 6 months? No  LIVING ENVIRONMENT: Lives with: lives alone Lives in: House/apartment Stairs: No Has following equipment at home: Environmental consultant - 2 wheeled, Wheelchair (manual), shower chair, bed side commode, Grab bars, and Ramped entry  OCCUPATION: retired  PLOF: Needs assistance with homemaking, Needs assistance with transfers, and Leisure: plays bridge, she has 11 hours of an aide at home that helps with meals, going to MD's, dressing and bathing, is alone at night  PATIENT GOALS: walk, have less pain, transfer without difficulty  NEXT MD VISIT: none scheduled  OBJECTIVE:   DIAGNOSTIC FINDINGS: none performed  COGNITION: Overall cognitive status: Within functional limits for tasks assessed     SENSATION: WFL POSTURE: rounded shoulders and forward head  PALPATION: Left knee and ankle are swollen and tender, left upper and neck  LOWER EXTREMITY ROM:  Active ROM Right eval Left eval  Left 07/31/23 Left 08/16/23  Hip flexion       Hip extension       Hip abduction       Hip adduction       Hip internal rotation       Hip external rotation       Knee flexion  90 93 96 95  Knee extension  0 0 0 0  Ankle dorsiflexion  5     Ankle plantarflexion   30 44 49 52  Ankle inversion  5     Ankle eversion  0      (Blank rows = not tested)  LOWER EXTREMITY MMT:  MMT Right eval Left eval Left 06/21/23 Left 07/31/23 08/23/23 left 09/25/23  Hip flexion 4- 3+ 4- 4 4- 4-  Hip extension        Hip abduction 4 3+ 4- 4+ 4 4  Hip adduction 4 3+ 4- 4 4- 4  Hip internal rotation        Hip external rotation        Knee flexion 4- 3+ 4 4 4+ 4+  Knee extension 4- 3+ 4- 4 4 4   Ankle dorsiflexion     4- 4  Ankle plantarflexion     4- 4  Ankle inversion        Ankle eversion         (Blank rows = not tested)  FUNCTIONAL TESTS:  Transfers Max A, left knee gives out and goes into ER and tends to bow into varus, has not walked in about 18 months, and the last time I saw her we could walk about 60 feet with HHA and w/c following  GAIT: Distance walked: unable   TODAY'S TREATMENT:                                                                                                                              DATE: 10/09/23 Approximation with motion of the fingers STM to the left upper trap and neck and into rhomboids Core work partial sit ups 5# LAQ 5# marches Green tband HS curls Green Tband hip adduction and abduction Green tband ankle exercises Gait with HHA and w/c follow 100 feet, rest and then 50 feet All transfers today were with set up and HHA, much safer  10/04/23 5# LAQ 5# marches Red tband hip adduction and abduction STM to the left upper trap, rhomboid and neck Red tband HS curls Adjusted the new brace as well as the old brace, both had poor velcro and I tried to put new velcro in, with the goal to protect the malleoli due to her issues in the past, showed and educated her on what to look for to help keep the velcro in place Gait with HHA and w/c follow x 75 feet  10/02/23 STM with t-gun to the upper traps and neck Approximation to L digits NuStep 7 min L5 526 steps 5# LAQ 5# march HS curls green 2x10 Hip add green  R ankle DF  and PF green x 10 Gait with w/c follow and HHA 70 feet  09/27/23 5# LAQ 5# march Green tband HS curl Green tband hip adduction Nustep level 5 x 10 minutes asked her to do 600 steps Gait with HHA and w/c follow x 10 feet, standing weight shifts Ankle brace adjustment  09/25/23 Nustep level 5 x 8 minutes STM to the left upper trap, rhomboid and neck Weighed her at 147# with two people helping Gait with w/c follow and HHA 69 feet, rest and then 60 feet Green tband HS curls Green tband ankle exercises Red tband hip adduction Red tband hip clamshells 5# LAQ 5# marches Standing weight shifts and orking on posture  09/20/23 Green tband ankle all motions green tband hip abduction and adduction, focus on adduction increased reps and sets green tband HS curls Nustep level 5 x 500 steps 8 minutes, more difficulty today keeping the knee in, required cues STM to the left and right upper trap and neck area due to pain and tightness Gait with HHA and w/c follow 63', then repeated and did 52'  09/18/23 STM to the upper traps and into the rhomboids Passive stretch left ankle DF Nustep level 5 x 6:40 500 steps Standing weight shifts Green tband HS curls difficult for her Green tband ankle PF/DF Green tband hip adduction very difficult Gait with HHA and  w/c follow x 60', rest then 48 ' 5# LAQ 2.5# marches  09/12/22 NuStep L5 x 7:39 min 500 steps  STM with t-gun to the upper traps and neck Red tband hip adduction Hip Abd LLE red 2x15  Red tband HS curl LLE  Red tband PF/DF LLE 5lb march LLE LAQ 5lb 2x10 Approximation to L digits Gait with HHA and w/c follow 55', stops due to fatigue  09/11/23 NuStep L5 x 6 min STM with t-gun to the upper traps and neck Gait with HHA and w/c follow 98' and 46', stops due to fatigue 5# LAQ Red tband PF/DF Red tband HS curl Red tband hip adduction Approximation to L digits  09/06/23 Nustep level 5 x 8 minutes STM with t-gun to the upper traps  and neck Gait with HHA and w/c follow x20 feet stopped due to left knee pain Standing weight shifts and had her bend and flex the knee multiple times, rest and repeat Then was able to walk with less pain x 45 feet  5# LAQ Red tband PF/DF Red tband HS curl Red tband hip adduction  PATIENT EDUCATION:  Education details: POC Person educated: Patient Education method: Explanation Education comprehension: verbalized understanding  HOME EXERCISE PROGRAM: TBD  ASSESSMENT:  CLINICAL IMPRESSION: Patient reports that caregivers have been telling her how much easier and better her transfers.  She is still having some ankle and knee pain but reports it is in the joint and reports that the brace is a lot better after I adjusted it. She was able to walk her farthest 1x today,  we got to 100 feet today  OBJECTIVE IMPAIRMENTS: Abnormal gait, cardiopulmonary status limiting activity, decreased activity tolerance, decreased balance, decreased coordination, decreased endurance, decreased mobility, difficulty walking, decreased ROM, decreased strength, increased edema, impaired flexibility, improper body mechanics, postural dysfunction, and pain.   REHAB POTENTIAL: Good  CLINICAL DECISION MAKING: Stable/uncomplicated  EVALUATION COMPLEXITY: Low   GOALS: Goals reviewed with patient? Yes  SHORT TERM GOALS: Target date: 06/02/23 Independent with advanced HEP Goal status: met 06/07/23  LONG TERM GOALS: Target date: 08/21/23  Independent with advanced HEP with caregiver Goal status: progressing 10/04/23  2.  Transfer with set up and CGA Goal status: Ongoing 09/25/23 min/mod A  3.  Walk HHA x 100 feet Goal status: Progressing 09/25/23 69'  4.  Increase left LE strength to 4/5 Goal status: Met 07/31/23  5.  Report neck and shoulder pain decreased 50% Goal status: Progressing 30% 10/04/23  PLAN:  PT FREQUENCY: 1-2x/week  PT DURATION: 12 weeks  PLANNED INTERVENTIONS: Therapeutic exercises,  Therapeutic activity, Neuromuscular re-education, Balance training, Gait training, Patient/Family education, Self Care, Joint mobilization, Moist heat, Taping, and Manual therapy  PLAN FOR NEXT SESSION:  continue to progress   Jearld Lesch, PT

## 2023-10-11 ENCOUNTER — Encounter: Payer: Self-pay | Admitting: Physical Therapy

## 2023-10-11 ENCOUNTER — Ambulatory Visit: Payer: Medicare Other | Admitting: Physical Therapy

## 2023-10-11 DIAGNOSIS — M542 Cervicalgia: Secondary | ICD-10-CM

## 2023-10-11 DIAGNOSIS — M6281 Muscle weakness (generalized): Secondary | ICD-10-CM | POA: Diagnosis not present

## 2023-10-11 DIAGNOSIS — I69354 Hemiplegia and hemiparesis following cerebral infarction affecting left non-dominant side: Secondary | ICD-10-CM

## 2023-10-11 DIAGNOSIS — R262 Difficulty in walking, not elsewhere classified: Secondary | ICD-10-CM

## 2023-10-11 DIAGNOSIS — R293 Abnormal posture: Secondary | ICD-10-CM

## 2023-10-11 DIAGNOSIS — I6990 Unspecified sequelae of unspecified cerebrovascular disease: Secondary | ICD-10-CM

## 2023-10-11 DIAGNOSIS — M25512 Pain in left shoulder: Secondary | ICD-10-CM

## 2023-10-11 DIAGNOSIS — R270 Ataxia, unspecified: Secondary | ICD-10-CM

## 2023-10-11 DIAGNOSIS — M25572 Pain in left ankle and joints of left foot: Secondary | ICD-10-CM | POA: Diagnosis not present

## 2023-10-11 NOTE — Therapy (Signed)
OUTPATIENT PHYSICAL THERAPY LOWER EXTREMITY TREATMENT  Progress Note Reporting Period 09/11/23 to 10/11/23 for visits 31-40  See note below for Objective Data and Assessment of Progress/Goals.       Patient Name: Karen Jackson MRN: [id number] DOB:1941-01-24, 83 y.o., female Today's Date: 10/11/2023  END OF SESSION:  PT End of Session - 10/11/23 1102     Visit Number 40    Date for PT Re-Evaluation 10/26/23    Authorization Type Medicare 13 this year    PT Start Time 1058    PT Stop Time 1144    PT Time Calculation (min) 46 min    Activity Tolerance Patient tolerated treatment well    Behavior During Therapy WFL for tasks assessed/performed             Past Medical History:  Diagnosis Date   Acute cystitis without hematuria    Acute diastolic CHF (congestive heart failure) (HCC)    Arthritis    Dyspnea    Dysrhythmia    Fever of unknown origin 03/19/2017   Hyperlipidemia    Hypertension    denies at preop   Multifocal pneumonia    Neuromuscular disorder (HCC)    neuropathy left arm and foot   Osteopenia    Paralysis (HCC)    partial left side from CVA    Persistent atrial fibrillation (HCC)    PONV (postoperative nausea and vomiting)    Pre-diabetes    Stroke [facility]) 2013   hemmorahgic   Past Surgical History:  Procedure Laterality Date   ANKLE SURGERY     APPENDECTOMY     CHOLECYSTECTOMY     HARDWARE REMOVAL Left 03/29/2023   Procedure: HARDWARE REMOVAL;  Surgeon: Toni Arthurs, MD;  Location: [facility];  Service: Orthopedics;  Laterality: Left;   HERNIA REPAIR     Esophagus   INCISION AND DRAINAGE OF WOUND Left 03/29/2023   Procedure: LEFT ANKLE WOUND IRRIGATION AND DEBRIDEMENT WOUND;  Surgeon: Toni Arthurs, MD;  Location: [facility];  Service: Orthopedics;  Laterality: Left;   JOINT REPLACEMENT     total- right partial- left   MASTECTOMY PARTIAL / LUMPECTOMY  2012   left   ORIF ANKLE FRACTURE Left 07/20/2018   Procedure: OPEN  REDUCTION INTERNAL FIXATION (ORIF) ANKLE FRACTURE;  Surgeon: Toni Arthurs, MD;  Location: [facility];  Service: Orthopedics;  Laterality: Left;   TOTAL KNEE ARTHROPLASTY Left 01/27/2019   Procedure: TOTAL KNEE ARTHROPLASTY;  Surgeon: Dannielle Huh, MD;  Location: [facility];  Service: Orthopedics;  Laterality: Left;   Patient Active Problem List   Diagnosis Date Noted   Goals of care, counseling/discussion 01/17/2022   Grief 01/17/2022   Secondary hypercoagulable state (HCC) 03/16/2021   Dizziness 03/10/2021   Orthostatic hypotension 10/15/2020   Presbycusis of both ears 03/08/2020   Mixed stress and urge urinary incontinence 12/02/2019   Macrocytosis 12/01/2019   Nutritional anemia 12/01/2019   S/P total knee replacement 01/27/2019   Recurrent left knee instability 07/05/2018   Respiratory failure with hypoxia (HCC) 08/30/2017   Hypoxemia    Heart failure with preserved ejection fraction (HCC), Grade 3 diastolic dysfunction 03/26/2017   PAF (paroxysmal atrial fibrillation) (HCC)    Dyspnea 03/19/2017   Encounter for preventive health examination 02/17/2016   Sensorineural hearing loss (SNHL), bilateral 01/26/2016   Hypomagnesemia 04/24/2014   Hemiparesis affecting left side as late effect of cerebrovascular accident (HCC) 04/24/2014   Nontraumatic cerebral hemorrhage (HCC) 04/30/2012   DM (diabetes mellitus) with complications (  HCC) 03/04/2010   OSTEOPENIA 01/21/2009   UNSPECIFIED VITAMIN D DEFICIENCY 11/19/2007   HYPERCHOLESTEROLEMIA 10/25/2006   GASTROESOPHAGEAL REFLUX, NO ESOPHAGITIS 10/25/2006   DIVERTICULOSIS OF COLON 10/25/2006   Osteoarthritis 10/25/2006   CERVICAL SPINE DISORDER, NOS 10/25/2006    PCP: Waynard Edwards, MD  REFERRING PROVIDER: Waynard Edwards, MD  REFERRING DIAG: s/p left ankle ORIF hardware removal  THERAPY DIAG:  Muscle weakness (generalized)  Left shoulder pain, unspecified chronicity  Pain in left ankle and joints of left foot  Difficulty in walking, not elsewhere  classified  Cervicalgia  Late effects of CVA (cerebrovascular accident)  Hemiplegia and hemiparesis following cerebral infarction affecting left non-dominant side (HCC)  Ataxia  Abnormal posture  Rationale for Evaluation and Treatment: Rehabilitation  ONSET DATE: 03/29/23  SUBJECTIVE:   SUBJECTIVE STATEMENT: She reports that she is really hurting in the shoulders today, cold and damp weather PERTINENT HISTORY: CVA, CHF, HTN, A-fib, TKA PAIN:  Are you having pain? Yes: NPRS scale: 6/10 Pain location: left upper trap and neck, left ankle and knee  Pain description: ache sore Aggravating factors: neck and shoulder always hurt, knee and ankle hurt with transfers Relieving factors: massage, heat  PRECAUTIONS: None  RED FLAGS: None   WEIGHT BEARING RESTRICTIONS: No  FALLS:  Has patient fallen in last 6 months? No  LIVING ENVIRONMENT: Lives with: lives alone Lives in: House/apartment Stairs: No Has following equipment at home: Environmental consultant - 2 wheeled, Wheelchair (manual), shower chair, bed side commode, Grab bars, and Ramped entry  OCCUPATION: retired  PLOF: Needs assistance with homemaking, Needs assistance with transfers, and Leisure: plays bridge, she has 11 hours of an aide at home that helps with meals, going to MD's, dressing and bathing, is alone at night  PATIENT GOALS: walk, have less pain, transfer without difficulty  NEXT MD VISIT: none scheduled  OBJECTIVE:   DIAGNOSTIC FINDINGS: none performed  COGNITION: Overall cognitive status: Within functional limits for tasks assessed     SENSATION: WFL POSTURE: rounded shoulders and forward head  PALPATION: Left knee and ankle are swollen and tender, left upper and neck  LOWER EXTREMITY ROM:  Active ROM Right eval Left eval  Left 07/31/23 Left 08/16/23  Hip flexion       Hip extension       Hip abduction       Hip adduction       Hip internal rotation       Hip external rotation       Knee flexion  90  93 96 95  Knee extension  0 0 0 0  Ankle dorsiflexion  5     Ankle plantarflexion  30 44 49 52  Ankle inversion  5     Ankle eversion  0      (Blank rows = not tested)  LOWER EXTREMITY MMT:  MMT Right eval Left eval Left 06/21/23 Left 07/31/23 08/23/23 left 09/25/23  Hip flexion 4- 3+ 4- 4 4- 4-  Hip extension        Hip abduction 4 3+ 4- 4+ 4 4  Hip adduction 4 3+ 4- 4 4- 4  Hip internal rotation        Hip external rotation        Knee flexion 4- 3+ 4 4 4+ 4+  Knee extension 4- 3+ 4- 4 4 4   Ankle dorsiflexion     4- 4  Ankle plantarflexion     4- 4  Ankle inversion        Ankle  eversion         (Blank rows = not tested)  FUNCTIONAL TESTS:  Transfers Max A, left knee gives out and goes into ER and tends to bow into varus, has not walked in about 18 months, and the last time I saw her we could walk about 60 feet with HHA and w/c following  GAIT: Distance walked: unable   TODAY'S TREATMENT:                                                                                                                              DATE: 10/11/23 Nustep level 5 x 8 minutes Red tband left hip adduction Red tband left hip abduction Left HS green tband curls Red tband ankle exercises Standing weight shift and pre gait activities Partial sit ups HHA gait with w/c follow x 80 feet, had some knee pain today, but mostly c/o shoulder pain STM to the upper traps and neck area, very tight and sore today  10/09/23 Approximation with motion of the fingers STM to the left upper trap and neck and into rhomboids Core work partial sit ups 5# LAQ 5# marches Green tband HS curls Green Tband hip adduction and abduction Green tband ankle exercises Gait with HHA and w/c follow 100 feet, rest and then 50 feet All transfers today were with set up and HHA, much safer  10/04/23 5# LAQ 5# marches Red tband hip adduction and abduction STM to the left upper trap, rhomboid and neck Red tband HS  curls Adjusted the new brace as well as the old brace, both had poor velcro and I tried to put new velcro in, with the goal to protect the malleoli due to her issues in the past, showed and educated her on what to look for to help keep the velcro in place Gait with HHA and w/c follow x 75 feet  10/02/23 STM with t-gun to the upper traps and neck Approximation to L digits NuStep 7 min L5 526 steps 5# LAQ 5# march HS curls green 2x10 Hip add green  R ankle DF and PF green x 10 Gait with w/c follow and HHA 70 feet  09/27/23 5# LAQ 5# march Green tband HS curl Green tband hip adduction Nustep level 5 x 10 minutes asked her to do 600 steps Gait with HHA and w/c follow x 10 feet, standing weight shifts Ankle brace adjustment  09/25/23 Nustep level 5 x 8 minutes STM to the left upper trap, rhomboid and neck Weighed her at 147# with two people helping Gait with w/c follow and HHA 69 feet, rest and then 60 feet Green tband HS curls Green tband ankle exercises Red tband hip adduction Red tband hip clamshells 5# LAQ 5# marches Standing weight shifts and orking on posture  09/20/23 Green tband ankle all motions green tband hip abduction and adduction, focus on adduction increased reps and sets green tband HS curls Nustep level 5 x 500 steps  8 minutes, more difficulty today keeping the knee in, required cues STM to the left and right upper trap and neck area due to pain and tightness Gait with HHA and w/c follow 63', then repeated and did 52'  09/18/23 STM to the upper traps and into the rhomboids Passive stretch left ankle DF Nustep level 5 x 6:40 500 steps Standing weight shifts Green tband HS curls difficult for her Green tband ankle PF/DF Green tband hip adduction very difficult Gait with HHA and w/c follow x 60', rest then 48 ' 5# LAQ 2.5# marches  09/12/22 NuStep L5 x 7:39 min 500 steps  STM with t-gun to the upper traps and neck Red tband hip adduction Hip Abd LLE red  2x15  Red tband HS curl LLE  Red tband PF/DF LLE 5lb march LLE LAQ 5lb 2x10 Approximation to L digits Gait with HHA and w/c follow 55', stops due to fatigue  09/11/23 NuStep L5 x 6 min STM with t-gun to the upper traps and neck Gait with HHA and w/c follow 57' and 46', stops due to fatigue 5# LAQ Red tband PF/DF Red tband HS curl Red tband hip adduction Approximation to L digits  09/06/23 Nustep level 5 x 8 minutes STM with t-gun to the upper traps and neck Gait with HHA and w/c follow x20 feet stopped due to left knee pain Standing weight shifts and had her bend and flex the knee multiple times, rest and repeat Then was able to walk with less pain x 45 feet  5# LAQ Red tband PF/DF Red tband HS curl Red tband hip adduction  PATIENT EDUCATION:  Education details: POC Person educated: Patient Education method: Explanation Education comprehension: verbalized understanding  HOME EXERCISE PROGRAM: 10/11/23 went over with patient and caregiver regarding brace placement and what to look for as far as the padding moving, ankle pumps, tband HS curls  ASSESSMENT:  CLINICAL IMPRESSION: Patient with a little more shoulder pain today, this limited her walking, she has been able to walk 100 feet recently with HHA and w/c follow.  She is transferring with much more ease and her caregiver reports that she has not had to help as much recently.  With set up she can go from sit to stand with Min A  OBJECTIVE IMPAIRMENTS: Abnormal gait, cardiopulmonary status limiting activity, decreased activity tolerance, decreased balance, decreased coordination, decreased endurance, decreased mobility, difficulty walking, decreased ROM, decreased strength, increased edema, impaired flexibility, improper body mechanics, postural dysfunction, and pain.   REHAB POTENTIAL: Good  CLINICAL DECISION MAKING: Stable/uncomplicated  EVALUATION COMPLEXITY: Low   GOALS: Goals reviewed with patient? Yes  SHORT  TERM GOALS: Target date: 06/02/23 Independent with advanced HEP Goal status: met 06/07/23  LONG TERM GOALS: Target date: 08/21/23  Independent with advanced HEP with caregiver Goal status: progressing 10/04/23  2.  Transfer with set up and CGA Goal status: Ongoing 09/25/23 min/mod A  3.  Walk HHA x 100 feet Goal status: Progressing on 10/09/23 she was able to go to 100 feet, but not consistently  4.  Increase left LE strength to 4/5 Goal status: Met 07/31/23  5.  Report neck and shoulder pain decreased 50% Goal status: Progressing 30% 10/04/23  PLAN:  PT FREQUENCY: 1-2x/week  PT DURATION: 12 weeks  PLANNED INTERVENTIONS: Therapeutic exercises, Therapeutic activity, Neuromuscular re-education, Balance training, Gait training, Patient/Family education, Self Care, Joint mobilization, Moist heat, Taping, and Manual therapy  PLAN FOR NEXT SESSION:  continue to progress   Jearld Lesch,  PT

## 2023-10-16 ENCOUNTER — Ambulatory Visit: Payer: Medicare Other | Admitting: Physical Therapy

## 2023-10-18 ENCOUNTER — Ambulatory Visit: Payer: Medicare Other | Admitting: Physical Therapy

## 2023-10-19 DIAGNOSIS — H6121 Impacted cerumen, right ear: Secondary | ICD-10-CM | POA: Diagnosis not present

## 2023-10-23 ENCOUNTER — Encounter: Payer: Self-pay | Admitting: Physical Therapy

## 2023-10-23 ENCOUNTER — Ambulatory Visit: Payer: Medicare Other | Admitting: Physical Therapy

## 2023-10-23 DIAGNOSIS — M542 Cervicalgia: Secondary | ICD-10-CM | POA: Diagnosis not present

## 2023-10-23 DIAGNOSIS — R293 Abnormal posture: Secondary | ICD-10-CM

## 2023-10-23 DIAGNOSIS — M25512 Pain in left shoulder: Secondary | ICD-10-CM | POA: Diagnosis not present

## 2023-10-23 DIAGNOSIS — M6281 Muscle weakness (generalized): Secondary | ICD-10-CM

## 2023-10-23 DIAGNOSIS — I6990 Unspecified sequelae of unspecified cerebrovascular disease: Secondary | ICD-10-CM

## 2023-10-23 DIAGNOSIS — M25572 Pain in left ankle and joints of left foot: Secondary | ICD-10-CM

## 2023-10-23 DIAGNOSIS — R262 Difficulty in walking, not elsewhere classified: Secondary | ICD-10-CM

## 2023-10-23 DIAGNOSIS — I69354 Hemiplegia and hemiparesis following cerebral infarction affecting left non-dominant side: Secondary | ICD-10-CM

## 2023-10-23 DIAGNOSIS — R270 Ataxia, unspecified: Secondary | ICD-10-CM

## 2023-10-23 NOTE — Therapy (Signed)
 OUTPATIENT PHYSICAL THERAPY LOWER EXTREMITY TREATMENT      Patient Name: Karen Jackson MRN: [id number] DOB:[birth year], 83 y.o., female Today's Date: 10/23/2023  END OF SESSION:  PT End of Session - 10/23/23 1105     Visit Number 41    Date for PT Re-Evaluation 10/26/23    Authorization Type Medicare 14 this year    PT Start Time 1057    PT Stop Time 1143    PT Time Calculation (min) 46 min    Activity Tolerance Patient tolerated treatment well    Behavior During Therapy WFL for tasks assessed/performed             Past Medical History:  Diagnosis Date   Acute cystitis without hematuria    Acute diastolic CHF (congestive heart failure) (HCC)    Arthritis    Dyspnea    Dysrhythmia    Fever of unknown origin 03/19/2017   Hyperlipidemia    Hypertension    denies at preop   Multifocal pneumonia    Neuromuscular disorder (HCC)    neuropathy left arm and foot   Osteopenia    Paralysis (HCC)    partial left side from CVA    Persistent atrial fibrillation (HCC)    PONV (postoperative nausea and vomiting)    Pre-diabetes    Stroke [facility]) 2013   hemmorahgic   Past Surgical History:  Procedure Laterality Date   ANKLE SURGERY     APPENDECTOMY     CHOLECYSTECTOMY     HARDWARE REMOVAL Left 03/29/2023   Procedure: HARDWARE REMOVAL;  Surgeon: Toni Arthurs, MD;  Location: [facility];  Service: Orthopedics;  Laterality: Left;   HERNIA REPAIR     Esophagus   INCISION AND DRAINAGE OF WOUND Left 03/29/2023   Procedure: LEFT ANKLE WOUND IRRIGATION AND DEBRIDEMENT WOUND;  Surgeon: Toni Arthurs, MD;  Location: [facility];  Service: Orthopedics;  Laterality: Left;   JOINT REPLACEMENT     total- right partial- left   MASTECTOMY PARTIAL / LUMPECTOMY  2012   left   ORIF ANKLE FRACTURE Left 07/20/2018   Procedure: OPEN REDUCTION INTERNAL FIXATION (ORIF) ANKLE FRACTURE;  Surgeon: Toni Arthurs, MD;  Location: [facility];  Service: Orthopedics;  Laterality: Left;    TOTAL KNEE ARTHROPLASTY Left 01/27/2019   Procedure: TOTAL KNEE ARTHROPLASTY;  Surgeon: Dannielle Huh, MD;  Location: [facility];  Service: Orthopedics;  Laterality: Left;   Patient Active Problem List   Diagnosis Date Noted   Goals of care, counseling/discussion 01/17/2022   Grief 01/17/2022   Secondary hypercoagulable state (HCC) 03/16/2021   Dizziness 03/10/2021   Orthostatic hypotension 10/15/2020   Presbycusis of both ears 03/08/2020   Mixed stress and urge urinary incontinence 12/02/2019   Macrocytosis 12/01/2019   Nutritional anemia 12/01/2019   S/P total knee replacement 01/27/2019   Recurrent left knee instability 07/05/2018   Respiratory failure with hypoxia (HCC) 08/30/2017   Hypoxemia    Heart failure with preserved ejection fraction (HCC), Grade 3 diastolic dysfunction 03/26/2017   PAF (paroxysmal atrial fibrillation) (HCC)    Dyspnea 03/19/2017   Encounter for preventive health examination 02/17/2016   Sensorineural hearing loss (SNHL), bilateral 01/26/2016   Hypomagnesemia 04/24/2014   Hemiparesis affecting left side as late effect of cerebrovascular accident (HCC) 04/24/2014   Nontraumatic cerebral hemorrhage (HCC) 04/30/2012   DM (diabetes mellitus) with complications (HCC) 03/04/2010   OSTEOPENIA 01/21/2009   UNSPECIFIED VITAMIN D DEFICIENCY 11/19/2007   HYPERCHOLESTEROLEMIA 10/25/2006   GASTROESOPHAGEAL REFLUX, NO ESOPHAGITIS  10/25/2006   DIVERTICULOSIS OF COLON 10/25/2006   Osteoarthritis 10/25/2006   CERVICAL SPINE DISORDER, NOS 10/25/2006    PCP: Waynard Edwards, MD  REFERRING PROVIDER: Waynard Edwards, MD  REFERRING DIAG: s/p left ankle ORIF hardware removal  THERAPY DIAG:  Muscle weakness (generalized)  Left shoulder pain, unspecified chronicity  Pain in left ankle and joints of left foot  Difficulty in walking, not elsewhere classified  Cervicalgia  Late effects of CVA (cerebrovascular accident)  Hemiplegia and hemiparesis following cerebral infarction affecting  left non-dominant side (HCC)  Ataxia  Abnormal posture  Rationale for Evaluation and Treatment: Rehabilitation  ONSET DATE: 03/29/23  SUBJECTIVE:   SUBJECTIVE STATEMENT: She reports that she is doing okay able to go to church without much difficulty PERTINENT HISTORY: CVA, CHF, HTN, A-fib, TKA PAIN:  Are you having pain? Yes: NPRS scale: 6/10 Pain location: left upper trap and neck, left ankle and knee  Pain description: ache sore Aggravating factors: neck and shoulder always hurt, knee and ankle hurt with transfers Relieving factors: massage, heat  PRECAUTIONS: None  RED FLAGS: None   WEIGHT BEARING RESTRICTIONS: No  FALLS:  Has patient fallen in last 6 months? No  LIVING ENVIRONMENT: Lives with: lives alone Lives in: House/apartment Stairs: No Has following equipment at home: Environmental consultant - 2 wheeled, Wheelchair (manual), shower chair, bed side commode, Grab bars, and Ramped entry  OCCUPATION: retired  PLOF: Needs assistance with homemaking, Needs assistance with transfers, and Leisure: plays bridge, she has 11 hours of an aide at home that helps with meals, going to MD's, dressing and bathing, is alone at night  PATIENT GOALS: walk, have less pain, transfer without difficulty  NEXT MD VISIT: none scheduled  OBJECTIVE:   DIAGNOSTIC FINDINGS: none performed  COGNITION: Overall cognitive status: Within functional limits for tasks assessed     SENSATION: WFL POSTURE: rounded shoulders and forward head  PALPATION: Left knee and ankle are swollen and tender, left upper and neck  LOWER EXTREMITY ROM:  Active ROM Right eval Left eval  Left 07/31/23 Left 08/16/23  Hip flexion       Hip extension       Hip abduction       Hip adduction       Hip internal rotation       Hip external rotation       Knee flexion  90 93 96 95  Knee extension  0 0 0 0  Ankle dorsiflexion  5     Ankle plantarflexion  30 44 49 52  Ankle inversion  5     Ankle eversion  0       (Blank rows = not tested)  LOWER EXTREMITY MMT:  MMT Right eval Left eval Left 06/21/23 Left 07/31/23 08/23/23 left 09/25/23  Hip flexion 4- 3+ 4- 4 4- 4-  Hip extension        Hip abduction 4 3+ 4- 4+ 4 4  Hip adduction 4 3+ 4- 4 4- 4  Hip internal rotation        Hip external rotation        Knee flexion 4- 3+ 4 4 4+ 4+  Knee extension 4- 3+ 4- 4 4 4   Ankle dorsiflexion     4- 4  Ankle plantarflexion     4- 4  Ankle inversion        Ankle eversion         (Blank rows = not tested)  FUNCTIONAL TESTS:  Transfers Max A, left knee  gives out and goes into ER and tends to bow into varus, has not walked in about 18 months, and the last time I saw her we could walk about 60 feet with HHA and w/c following  GAIT: Distance walked: unable   TODAY'S TREATMENT:                                                                                                                              DATE: 10/23/23 Nustep level 5 x 7 minutes Approximation left fingers and wrist STM to the left upper trap and neck area Standing weight shifts Partial sit ups Sliding board and towel left hip abduction Red tband left hip adduction Green tband HS curls Green tband ankle PF/DF Gait with HHA and w/c follow 82 feet, then rest and 70 feet  10/11/23 Nustep level 5 x 8 minutes Red tband left hip adduction Red tband left hip abduction Left HS green tband curls Red tband ankle exercises Standing weight shift and pre gait activities Partial sit ups HHA gait with w/c follow x 80 feet, had some knee pain today, but mostly c/o shoulder pain STM to the upper traps and neck area, very tight and sore today  10/09/23 Approximation with motion of the fingers STM to the left upper trap and neck and into rhomboids Core work partial sit ups 5# LAQ 5# marches Green tband HS curls Green Tband hip adduction and abduction Green tband ankle exercises Gait with HHA and w/c follow 100 feet, rest and then 50 feet All  transfers today were with set up and HHA, much safer  10/04/23 5# LAQ 5# marches Red tband hip adduction and abduction STM to the left upper trap, rhomboid and neck Red tband HS curls Adjusted the new brace as well as the old brace, both had poor velcro and I tried to put new velcro in, with the goal to protect the malleoli due to her issues in the past, showed and educated her on what to look for to help keep the velcro in place Gait with HHA and w/c follow x 75 feet  10/02/23 STM with t-gun to the upper traps and neck Approximation to L digits NuStep 7 min L5 526 steps 5# LAQ 5# march HS curls green 2x10 Hip add green  R ankle DF and PF green x 10 Gait with w/c follow and HHA 70 feet  09/27/23 5# LAQ 5# march Green tband HS curl Green tband hip adduction Nustep level 5 x 10 minutes asked her to do 600 steps Gait with HHA and w/c follow x 10 feet, standing weight shifts Ankle brace adjustment  09/25/23 Nustep level 5 x 8 minutes STM to the left upper trap, rhomboid and neck Weighed her at 147# with two people helping Gait with w/c follow and HHA 69 feet, rest and then 60 feet Green tband HS curls Green tband ankle exercises Red tband hip adduction Red tband hip clamshells 5# LAQ 5#  marches Standing weight shifts and orking on posture  09/20/23 Green tband ankle all motions green tband hip abduction and adduction, focus on adduction increased reps and sets green tband HS curls Nustep level 5 x 500 steps 8 minutes, more difficulty today keeping the knee in, required cues STM to the left and right upper trap and neck area due to pain and tightness Gait with HHA and w/c follow 63', then repeated and did 52'  PATIENT EDUCATION:  Education details: POC Person educated: Patient Education method: Explanation Education comprehension: verbalized understanding  HOME EXERCISE PROGRAM: 10/11/23 went over with patient and caregiver regarding brace placement and what to look for  as far as the padding moving, ankle pumps, tband HS curls  ASSESSMENT:  CLINICAL IMPRESSION: Patient seems to be moving easier, not as much effort for walking and for transfers.  She did not have as much pain with the knee and or ankle today.  She limited her self due to fatigue with the distance today  OBJECTIVE IMPAIRMENTS: Abnormal gait, cardiopulmonary status limiting activity, decreased activity tolerance, decreased balance, decreased coordination, decreased endurance, decreased mobility, difficulty walking, decreased ROM, decreased strength, increased edema, impaired flexibility, improper body mechanics, postural dysfunction, and pain.   REHAB POTENTIAL: Good  CLINICAL DECISION MAKING: Stable/uncomplicated  EVALUATION COMPLEXITY: Low   GOALS: Goals reviewed with patient? Yes  SHORT TERM GOALS: Target date: 06/02/23 Independent with advanced HEP Goal status: met 06/07/23  LONG TERM GOALS: Target date: 08/21/23  Independent with advanced HEP with caregiver Goal status: progressing 10/04/23  2.  Transfer with set up and CGA Goal status: Ongoing 09/25/23 min/mod A  3.  Walk HHA x 100 feet Goal status: Progressing on 10/09/23 she was able to go to 100 feet, but not consistently  4.  Increase left LE strength to 4/5 Goal status: Met 07/31/23  5.  Report neck and shoulder pain decreased 50% Goal status: Progressing 30% 10/04/23  PLAN:  PT FREQUENCY: 1-2x/week  PT DURATION: 12 weeks  PLANNED INTERVENTIONS: Therapeutic exercises, Therapeutic activity, Neuromuscular re-education, Balance training, Gait training, Patient/Family education, Self Care, Joint mobilization, Moist heat, Taping, and Manual therapy  PLAN FOR NEXT SESSION:  assess   Jearld Lesch, PT

## 2023-10-25 ENCOUNTER — Encounter: Payer: Self-pay | Admitting: Physical Therapy

## 2023-10-25 ENCOUNTER — Ambulatory Visit: Payer: Medicare Other | Admitting: Physical Therapy

## 2023-10-25 DIAGNOSIS — M6281 Muscle weakness (generalized): Secondary | ICD-10-CM | POA: Diagnosis not present

## 2023-10-25 DIAGNOSIS — M25512 Pain in left shoulder: Secondary | ICD-10-CM | POA: Diagnosis not present

## 2023-10-25 DIAGNOSIS — M542 Cervicalgia: Secondary | ICD-10-CM

## 2023-10-25 DIAGNOSIS — R262 Difficulty in walking, not elsewhere classified: Secondary | ICD-10-CM

## 2023-10-25 DIAGNOSIS — M25572 Pain in left ankle and joints of left foot: Secondary | ICD-10-CM | POA: Diagnosis not present

## 2023-10-25 DIAGNOSIS — R270 Ataxia, unspecified: Secondary | ICD-10-CM

## 2023-10-25 DIAGNOSIS — R293 Abnormal posture: Secondary | ICD-10-CM

## 2023-10-25 DIAGNOSIS — I69354 Hemiplegia and hemiparesis following cerebral infarction affecting left non-dominant side: Secondary | ICD-10-CM

## 2023-10-25 DIAGNOSIS — I6990 Unspecified sequelae of unspecified cerebrovascular disease: Secondary | ICD-10-CM

## 2023-10-25 NOTE — Therapy (Signed)
 OUTPATIENT PHYSICAL THERAPY LOWER EXTREMITY TREATMENT      Patient Name: Karen Jackson MRN: [id number] DOB:04/08/1941, 83 y.o., female Today's Date: 10/25/2023  END OF SESSION:  PT End of Session - 10/25/23 1122     Visit Number 42    Date for PT Re-Evaluation 10/26/23    Authorization Type Medicare 15 this year    PT Start Time 1057    PT Stop Time 1143    PT Time Calculation (min) 46 min    Activity Tolerance Patient tolerated treatment well    Behavior During Therapy WFL for tasks assessed/performed             Past Medical History:  Diagnosis Date   Acute cystitis without hematuria    Acute diastolic CHF (congestive heart failure) (HCC)    Arthritis    Dyspnea    Dysrhythmia    Fever of unknown origin 03/19/2017   Hyperlipidemia    Hypertension    denies at preop   Multifocal pneumonia    Neuromuscular disorder (HCC)    neuropathy left arm and foot   Osteopenia    Paralysis (HCC)    partial left side from CVA    Persistent atrial fibrillation (HCC)    PONV (postoperative nausea and vomiting)    Pre-diabetes    Stroke [facility]) 2013   hemmorahgic   Past Surgical History:  Procedure Laterality Date   ANKLE SURGERY     APPENDECTOMY     CHOLECYSTECTOMY     HARDWARE REMOVAL Left 03/29/2023   Procedure: HARDWARE REMOVAL;  Surgeon: Toni Arthurs, MD;  Location: [facility];  Service: Orthopedics;  Laterality: Left;   HERNIA REPAIR     Esophagus   INCISION AND DRAINAGE OF WOUND Left 03/29/2023   Procedure: LEFT ANKLE WOUND IRRIGATION AND DEBRIDEMENT WOUND;  Surgeon: Toni Arthurs, MD;  Location: [facility];  Service: Orthopedics;  Laterality: Left;   JOINT REPLACEMENT     total- right partial- left   MASTECTOMY PARTIAL / LUMPECTOMY  2012   left   ORIF ANKLE FRACTURE Left 07/20/2018   Procedure: OPEN REDUCTION INTERNAL FIXATION (ORIF) ANKLE FRACTURE;  Surgeon: Toni Arthurs, MD;  Location: [facility];  Service: Orthopedics;  Laterality: Left;    TOTAL KNEE ARTHROPLASTY Left 01/27/2019   Procedure: TOTAL KNEE ARTHROPLASTY;  Surgeon: Dannielle Huh, MD;  Location: [facility];  Service: Orthopedics;  Laterality: Left;   Patient Active Problem List   Diagnosis Date Noted   Goals of care, counseling/discussion 01/17/2022   Grief 01/17/2022   Secondary hypercoagulable state (HCC) 03/16/2021   Dizziness 03/10/2021   Orthostatic hypotension 10/15/2020   Presbycusis of both ears 03/08/2020   Mixed stress and urge urinary incontinence 12/02/2019   Macrocytosis 12/01/2019   Nutritional anemia 12/01/2019   S/P total knee replacement 01/27/2019   Recurrent left knee instability 07/05/2018   Respiratory failure with hypoxia (HCC) 08/30/2017   Hypoxemia    Heart failure with preserved ejection fraction (HCC), Grade 3 diastolic dysfunction 03/26/2017   PAF (paroxysmal atrial fibrillation) (HCC)    Dyspnea 03/19/2017   Encounter for preventive health examination 02/17/2016   Sensorineural hearing loss (SNHL), bilateral 01/26/2016   Hypomagnesemia 04/24/2014   Hemiparesis affecting left side as late effect of cerebrovascular accident (HCC) 04/24/2014   Nontraumatic cerebral hemorrhage (HCC) 04/30/2012   DM (diabetes mellitus) with complications (HCC) 03/04/2010   OSTEOPENIA 01/21/2009   UNSPECIFIED VITAMIN D DEFICIENCY 11/19/2007   HYPERCHOLESTEROLEMIA 10/25/2006   GASTROESOPHAGEAL REFLUX, NO ESOPHAGITIS  10/25/2006   DIVERTICULOSIS OF COLON 10/25/2006   Osteoarthritis 10/25/2006   CERVICAL SPINE DISORDER, NOS 10/25/2006    PCP: Waynard Edwards, MD  REFERRING PROVIDER: Waynard Edwards, MD  REFERRING DIAG: s/p left ankle ORIF hardware removal  THERAPY DIAG:  Muscle weakness (generalized)  Left shoulder pain, unspecified chronicity  Pain in left ankle and joints of left foot  Difficulty in walking, not elsewhere classified  Cervicalgia  Late effects of CVA (cerebrovascular accident)  Hemiplegia and hemiparesis following cerebral infarction affecting  left non-dominant side (HCC)  Ataxia  Abnormal posture  Rationale for Evaluation and Treatment: Rehabilitation  ONSET DATE: 03/29/23  SUBJECTIVE:   SUBJECTIVE STATEMENT: Reports a little more pain in the upper traps and the neck   she continues to report that her care givers are saying how much better and easier she is transferring PERTINENT HISTORY: CVA, CHF, HTN, A-fib, TKA PAIN:  Are you having pain? Yes: NPRS scale: 6/10 Pain location: left upper trap and neck, left ankle and knee  Pain description: ache sore Aggravating factors: neck and shoulder always hurt, knee and ankle hurt with transfers Relieving factors: massage, heat  PRECAUTIONS: None  RED FLAGS: None   WEIGHT BEARING RESTRICTIONS: No  FALLS:  Has patient fallen in last 6 months? No  LIVING ENVIRONMENT: Lives with: lives alone Lives in: House/apartment Stairs: No Has following equipment at home: Environmental consultant - 2 wheeled, Wheelchair (manual), shower chair, bed side commode, Grab bars, and Ramped entry  OCCUPATION: retired  PLOF: Needs assistance with homemaking, Needs assistance with transfers, and Leisure: plays bridge, she has 11 hours of an aide at home that helps with meals, going to MD's, dressing and bathing, is alone at night  PATIENT GOALS: walk, have less pain, transfer without difficulty  NEXT MD VISIT: none scheduled  OBJECTIVE:   DIAGNOSTIC FINDINGS: none performed  COGNITION: Overall cognitive status: Within functional limits for tasks assessed     SENSATION: WFL POSTURE: rounded shoulders and forward head  PALPATION: Left knee and ankle are swollen and tender, left upper and neck  LOWER EXTREMITY ROM:  Active ROM Right eval Left eval  Left 07/31/23 Left 08/16/23  Hip flexion       Hip extension       Hip abduction       Hip adduction       Hip internal rotation       Hip external rotation       Knee flexion  90 93 96 95  Knee extension  0 0 0 0  Ankle dorsiflexion  5      Ankle plantarflexion  30 44 49 52  Ankle inversion  5     Ankle eversion  0      (Blank rows = not tested)  LOWER EXTREMITY MMT:  MMT Right eval Left eval Left 06/21/23 Left 07/31/23 08/23/23 left 09/25/23  Hip flexion 4- 3+ 4- 4 4- 4-  Hip extension        Hip abduction 4 3+ 4- 4+ 4 4  Hip adduction 4 3+ 4- 4 4- 4  Hip internal rotation        Hip external rotation        Knee flexion 4- 3+ 4 4 4+ 4+  Knee extension 4- 3+ 4- 4 4 4   Ankle dorsiflexion     4- 4  Ankle plantarflexion     4- 4  Ankle inversion        Ankle eversion         (  Blank rows = not tested)  FUNCTIONAL TESTS:  Transfers Max A, left knee gives out and goes into ER and tends to bow into varus, has not walked in about 18 months, and the last time I saw her we could walk about 60 feet with HHA and w/c following  GAIT: Distance walked: unable   TODAY'S TREATMENT:                                                                                                                              DATE: 10/25/23 Standing weight shifts Standing small marches Red tband right arm rows and extension Green tband left ankle PF/DF Green tband HS curls Red tband left hip adduction STM to the bilateral upper traps and neck area Gait with HHA and w/c follow 100 feet  10/23/23 Nustep level 5 x 7 minutes Approximation left fingers and wrist STM to the left upper trap and neck area Standing weight shifts Partial sit ups Sliding board and towel left hip abduction Red tband left hip adduction Green tband HS curls Green tband ankle PF/DF Gait with HHA and w/c follow 82 feet, then rest and 70 feet  10/11/23 Nustep level 5 x 8 minutes Red tband left hip adduction Red tband left hip abduction Left HS green tband curls Red tband ankle exercises Standing weight shift and pre gait activities Partial sit ups HHA gait with w/c follow x 80 feet, had some knee pain today, but mostly c/o shoulder pain STM to the upper traps  and neck area, very tight and sore today  10/09/23 Approximation with motion of the fingers STM to the left upper trap and neck and into rhomboids Core work partial sit ups 5# LAQ 5# marches Green tband HS curls Green Tband hip adduction and abduction Green tband ankle exercises Gait with HHA and w/c follow 100 feet, rest and then 50 feet All transfers today were with set up and HHA, much safer  10/04/23 5# LAQ 5# marches Red tband hip adduction and abduction STM to the left upper trap, rhomboid and neck Red tband HS curls Adjusted the new brace as well as the old brace, both had poor velcro and I tried to put new velcro in, with the goal to protect the malleoli due to her issues in the past, showed and educated her on what to look for to help keep the velcro in place Gait with HHA and w/c follow x 75 feet  10/02/23 STM with t-gun to the upper traps and neck Approximation to L digits NuStep 7 min L5 526 steps 5# LAQ 5# march HS curls green 2x10 Hip add green  R ankle DF and PF green x 10 Gait with w/c follow and HHA 70 feet  09/27/23 5# LAQ 5# march Green tband HS curl Green tband hip adduction Nustep level 5 x 10 minutes asked her to do 600 steps Gait with HHA and w/c follow x 10 feet, standing weight shifts  Ankle brace adjustment  09/25/23 Nustep level 5 x 8 minutes STM to the left upper trap, rhomboid and neck Weighed her at 147# with two people helping Gait with w/c follow and HHA 69 feet, rest and then 60 feet Green tband HS curls Green tband ankle exercises Red tband hip adduction Red tband hip clamshells 5# LAQ 5# marches Standing weight shifts and orking on posture  09/20/23 Green tband ankle all motions green tband hip abduction and adduction, focus on adduction increased reps and sets green tband HS curls Nustep level 5 x 500 steps 8 minutes, more difficulty today keeping the knee in, required cues STM to the left and right upper trap and neck area due  to pain and tightness Gait with HHA and w/c follow 63', then repeated and did 52'  PATIENT EDUCATION:  Education details: POC Person educated: Patient Education method: Explanation Education comprehension: verbalized understanding  HOME EXERCISE PROGRAM: 10/11/23 went over with patient and caregiver regarding brace placement and what to look for as far as the padding moving, ankle pumps, tband HS curls  ASSESSMENT:  CLINICAL IMPRESSION: Patient has been pretty consistent with the ability to walk 80+ feet.  She still has some c/o left knee pain, feels like the ankle brace is going well.  Caregivers continues to tell me and her how much easier transfers are and have been.  She seems to really be moving better with less effort and less fear OBJECTIVE IMPAIRMENTS: Abnormal gait, cardiopulmonary status limiting activity, decreased activity tolerance, decreased balance, decreased coordination, decreased endurance, decreased mobility, difficulty walking, decreased ROM, decreased strength, increased edema, impaired flexibility, improper body mechanics, postural dysfunction, and pain.   REHAB POTENTIAL: Good  CLINICAL DECISION MAKING: Stable/uncomplicated  EVALUATION COMPLEXITY: Low   GOALS: Goals reviewed with patient? Yes  SHORT TERM GOALS: Target date: 06/02/23 Independent with advanced HEP Goal status: met 06/07/23  LONG TERM GOALS: Target date: 08/21/23  Independent with advanced HEP with caregiver Goal status: progressing 10/25/23  2.  Transfer with set up and CGA Goal status: Ongoing 10/25/23 min/A  3.  Walk HHA x 100 feet Goal status: Progressing on 10/09/23 she was able to go to 100 feet, but not consistently  4.  Increase left LE strength to 4/5 Goal status: Met 07/31/23  5.  Report neck and shoulder pain decreased 50% Goal status: Progressing 30% 10/25/23  PLAN:  PT FREQUENCY: 1-2x/week  PT DURATION: 12 weeks  PLANNED INTERVENTIONS: Therapeutic exercises, Therapeutic  activity, Neuromuscular re-education, Balance training, Gait training, Patient/Family education, Self Care, Joint mobilization, Moist heat, Taping, and Manual therapy  PLAN FOR NEXT SESSION:  progress walking as tolerated   Jearld Lesch, PT

## 2023-10-30 ENCOUNTER — Ambulatory Visit: Payer: Medicare Other | Attending: Internal Medicine | Admitting: Physical Therapy

## 2023-10-30 ENCOUNTER — Encounter: Payer: Self-pay | Admitting: Physical Therapy

## 2023-10-30 DIAGNOSIS — R270 Ataxia, unspecified: Secondary | ICD-10-CM | POA: Diagnosis not present

## 2023-10-30 DIAGNOSIS — M25572 Pain in left ankle and joints of left foot: Secondary | ICD-10-CM | POA: Insufficient documentation

## 2023-10-30 DIAGNOSIS — M6281 Muscle weakness (generalized): Secondary | ICD-10-CM | POA: Diagnosis not present

## 2023-10-30 DIAGNOSIS — I69354 Hemiplegia and hemiparesis following cerebral infarction affecting left non-dominant side: Secondary | ICD-10-CM | POA: Insufficient documentation

## 2023-10-30 DIAGNOSIS — R293 Abnormal posture: Secondary | ICD-10-CM | POA: Insufficient documentation

## 2023-10-30 DIAGNOSIS — I6990 Unspecified sequelae of unspecified cerebrovascular disease: Secondary | ICD-10-CM | POA: Diagnosis not present

## 2023-10-30 DIAGNOSIS — M542 Cervicalgia: Secondary | ICD-10-CM | POA: Insufficient documentation

## 2023-10-30 DIAGNOSIS — R262 Difficulty in walking, not elsewhere classified: Secondary | ICD-10-CM | POA: Insufficient documentation

## 2023-10-30 DIAGNOSIS — M25512 Pain in left shoulder: Secondary | ICD-10-CM | POA: Diagnosis not present

## 2023-10-30 NOTE — Therapy (Addendum)
 OUTPATIENT PHYSICAL THERAPY LOWER EXTREMITY TREATMENT      Patient Name: Karen Jackson MRN: [id number] DOB:18-Aug-1941, 83 y.o., female Today's Date: 10/30/2023  END OF SESSION:  PT End of Session - 10/30/23 1109     Visit Number 43    Date for PT Re-Evaluation 11/30/23    PT Start Time 1100    PT Stop Time 1145    PT Time Calculation (min) 45 min    Activity Tolerance Patient tolerated treatment well    Behavior During Therapy [facility] for tasks assessed/performed             Past Medical History:  Diagnosis Date   Acute cystitis without hematuria    Acute diastolic CHF (congestive heart failure) (HCC)    Arthritis    Dyspnea    Dysrhythmia    Fever of unknown origin 03/19/2017   Hyperlipidemia    Hypertension    denies at preop   Multifocal pneumonia    Neuromuscular disorder (HCC)    neuropathy left arm and foot   Osteopenia    Paralysis (HCC)    partial left side from CVA    Persistent atrial fibrillation (HCC)    PONV (postoperative nausea and vomiting)    Pre-diabetes    Stroke [facility]) 2013   hemmorahgic   Past Surgical History:  Procedure Laterality Date   ANKLE SURGERY     APPENDECTOMY     CHOLECYSTECTOMY     HARDWARE REMOVAL Left 03/29/2023   Procedure: HARDWARE REMOVAL;  Surgeon: Toni Arthurs, MD;  Location: [facility];  Service: Orthopedics;  Laterality: Left;   HERNIA REPAIR     Esophagus   INCISION AND DRAINAGE OF WOUND Left 03/29/2023   Procedure: LEFT ANKLE WOUND IRRIGATION AND DEBRIDEMENT WOUND;  Surgeon: Toni Arthurs, MD;  Location: [facility];  Service: Orthopedics;  Laterality: Left;   JOINT REPLACEMENT     total- right partial- left   MASTECTOMY PARTIAL / LUMPECTOMY  2012   left   ORIF ANKLE FRACTURE Left 07/20/2018   Procedure: OPEN REDUCTION INTERNAL FIXATION (ORIF) ANKLE FRACTURE;  Surgeon: Toni Arthurs, MD;  Location: [facility];  Service: Orthopedics;  Laterality: Left;   TOTAL KNEE ARTHROPLASTY Left 01/27/2019    Procedure: TOTAL KNEE ARTHROPLASTY;  Surgeon: Dannielle Huh, MD;  Location: [facility];  Service: Orthopedics;  Laterality: Left;   Patient Active Problem List   Diagnosis Date Noted   Goals of care, counseling/discussion 01/17/2022   Grief 01/17/2022   Secondary hypercoagulable state (HCC) 03/16/2021   Dizziness 03/10/2021   Orthostatic hypotension 10/15/2020   Presbycusis of both ears 03/08/2020   Mixed stress and urge urinary incontinence 12/02/2019   Macrocytosis 12/01/2019   Nutritional anemia 12/01/2019   S/P total knee replacement 01/27/2019   Recurrent left knee instability 07/05/2018   Respiratory failure with hypoxia (HCC) 08/30/2017   Hypoxemia    Heart failure with preserved ejection fraction (HCC), Grade 3 diastolic dysfunction 03/26/2017   PAF (paroxysmal atrial fibrillation) (HCC)    Dyspnea 03/19/2017   Encounter for preventive health examination 02/17/2016   Sensorineural hearing loss (SNHL), bilateral 01/26/2016   Hypomagnesemia 04/24/2014   Hemiparesis affecting left side as late effect of cerebrovascular accident (HCC) 04/24/2014   Nontraumatic cerebral hemorrhage (HCC) 04/30/2012   DM (diabetes mellitus) with complications (HCC) 03/04/2010   OSTEOPENIA 01/21/2009   UNSPECIFIED VITAMIN D DEFICIENCY 11/19/2007   HYPERCHOLESTEROLEMIA 10/25/2006   GASTROESOPHAGEAL REFLUX, NO ESOPHAGITIS 10/25/2006   DIVERTICULOSIS OF COLON 10/25/2006  Osteoarthritis 10/25/2006   CERVICAL SPINE DISORDER, NOS 10/25/2006    PCP: Waynard Edwards, MD  REFERRING PROVIDER: Waynard Edwards, MD  REFERRING DIAG: s/p left ankle ORIF hardware removal  THERAPY DIAG:  Muscle weakness (generalized)  Left shoulder pain, unspecified chronicity  Difficulty in walking, not elsewhere classified  Pain in left ankle and joints of left foot  Cervicalgia  Rationale for Evaluation and Treatment: Rehabilitation  ONSET DATE: 03/29/23  SUBJECTIVE:   SUBJECTIVE STATEMENT: Shoulder and knees are sore PERTINENT  HISTORY: CVA, CHF, HTN, A-fib, TKA PAIN:  Are you having pain? Yes: NPRS scale: 5/10 Pain location: left upper trap and neck, left ankle and knee  Pain description: ache sore Aggravating factors: neck and shoulder always hurt, knee and ankle hurt with transfers Relieving factors: massage, heat  PRECAUTIONS: None  RED FLAGS: None   WEIGHT BEARING RESTRICTIONS: No  FALLS:  Has patient fallen in last 6 months? No  LIVING ENVIRONMENT: Lives with: lives alone Lives in: House/apartment Stairs: No Has following equipment at home: Environmental consultant - 2 wheeled, Wheelchair (manual), shower chair, bed side commode, Grab bars, and Ramped entry  OCCUPATION: retired  PLOF: Needs assistance with homemaking, Needs assistance with transfers, and Leisure: plays bridge, she has 11 hours of an aide at home that helps with meals, going to MD's, dressing and bathing, is alone at night  PATIENT GOALS: walk, have less pain, transfer without difficulty  NEXT MD VISIT: none scheduled  OBJECTIVE:   DIAGNOSTIC FINDINGS: none performed  COGNITION: Overall cognitive status: Within functional limits for tasks assessed     SENSATION: WFL POSTURE: rounded shoulders and forward head  PALPATION: Left knee and ankle are swollen and tender, left upper and neck  LOWER EXTREMITY ROM:  Active ROM Right eval Left eval  Left 07/31/23 Left 08/16/23  Hip flexion       Hip extension       Hip abduction       Hip adduction       Hip internal rotation       Hip external rotation       Knee flexion  90 93 96 95  Knee extension  0 0 0 0  Ankle dorsiflexion  5     Ankle plantarflexion  30 44 49 52  Ankle inversion  5     Ankle eversion  0      (Blank rows = not tested)  LOWER EXTREMITY MMT:  MMT Right eval Left eval Left 06/21/23 Left 07/31/23 08/23/23 left 09/25/23  Hip flexion 4- 3+ 4- 4 4- 4-  Hip extension        Hip abduction 4 3+ 4- 4+ 4 4  Hip adduction 4 3+ 4- 4 4- 4  Hip internal rotation         Hip external rotation        Knee flexion 4- 3+ 4 4 4+ 4+  Knee extension 4- 3+ 4- 4 4 4   Ankle dorsiflexion     4- 4  Ankle plantarflexion     4- 4  Ankle inversion        Ankle eversion         (Blank rows = not tested)  FUNCTIONAL TESTS:  Transfers Max A, left knee gives out and goes into ER and tends to bow into varus, has not walked in about 18 months, and the last time I saw her we could walk about 60 feet with HHA and w/c following  GAIT: Distance walked: unable  TODAY'S TREATMENT:                                                                                                                              DATE: 10/30/23 Approximation to L digits Thera gun to upper trap NuStep L 5 x 6:28 min 500 steps  HS curls green 2x15 each LLE hip add green 2x10 LAQ 2lb 2x15 each  Standing march 2lb x10 Gait with HHA and w/c follow 100 feet, then 45 feet  Checked goals  10/25/23 Standing weight shifts Standing small marches Red tband right arm rows and extension Green tband left ankle PF/DF Green tband HS curls Red tband left hip adduction STM to the bilateral upper traps and neck area Gait with HHA and w/c follow 100 feet  10/23/23 Nustep level 5 x 7 minutes Approximation left fingers and wrist STM to the left upper trap and neck area Standing weight shifts Partial sit ups Sliding board and towel left hip abduction Red tband left hip adduction Green tband HS curls Green tband ankle PF/DF Gait with HHA and w/c follow 82 feet, then rest and 70 feet  10/11/23 Nustep level 5 x 8 minutes Red tband left hip adduction Red tband left hip abduction Left HS green tband curls Red tband ankle exercises Standing weight shift and pre gait activities Partial sit ups HHA gait with w/c follow x 80 feet, had some knee pain today, but mostly c/o shoulder pain STM to the upper traps and neck area, very tight and sore today  10/09/23 Approximation with motion of the fingers STM to  the left upper trap and neck and into rhomboids Core work partial sit ups 5# LAQ 5# marches Green tband HS curls Green Tband hip adduction and abduction Green tband ankle exercises Gait with HHA and w/c follow 100 feet, rest and then 50 feet All transfers today were with set up and HHA, much safer  10/04/23 5# LAQ 5# marches Red tband hip adduction and abduction STM to the left upper trap, rhomboid and neck Red tband HS curls Adjusted the new brace as well as the old brace, both had poor velcro and I tried to put new velcro in, with the goal to protect the malleoli due to her issues in the past, showed and educated her on what to look for to help keep the velcro in place Gait with HHA and w/c follow x 75 feet  10/02/23 STM with t-gun to the upper traps and neck Approximation to L digits NuStep 7 min L5 526 steps 5# LAQ 5# march HS curls green 2x10 Hip add green  R ankle DF and PF green x 10 Gait with w/c follow and HHA 70 feet  09/27/23 5# LAQ 5# march Green tband HS curl Green tband hip adduction Nustep level 5 x 10 minutes asked her to do 600 steps Gait with HHA and w/c follow x 10 feet, standing weight shifts Ankle brace adjustment  09/25/23  Nustep level 5 x 8 minutes STM to the left upper trap, rhomboid and neck Weighed her at 147# with two people helping Gait with w/c follow and HHA 69 feet, rest and then 60 feet Green tband HS curls Green tband ankle exercises Red tband hip adduction Red tband hip clamshells 5# LAQ 5# marches Standing weight shifts and orking on posture  09/20/23 Green tband ankle all motions green tband hip abduction and adduction, focus on adduction increased reps and sets green tband HS curls Nustep level 5 x 500 steps 8 minutes, more difficulty today keeping the knee in, required cues STM to the left and right upper trap and neck area due to pain and tightness Gait with HHA and w/c follow 63', then repeated and did 52'  PATIENT  EDUCATION:  Education details: POC Person educated: Patient Education method: Explanation Education comprehension: verbalized understanding  HOME EXERCISE PROGRAM: 10/11/23 went over with patient and caregiver regarding brace placement and what to look for as far as the padding moving, ankle pumps, tband HS curls  ASSESSMENT:  CLINICAL IMPRESSION: Again patient has been pretty consistent with the ability to walk 80+ feet.  Knee pain and fatigue are her limiting factors with gait.  She was a lot safer with transfers today not sitting prematurely. Improved upright posture noted today, PTA did not have to bend down as far toe ambulate with Pt. She seems to really be moving better.   OBJECTIVE IMPAIRMENTS: Abnormal gait, cardiopulmonary status limiting activity, decreased activity tolerance, decreased balance, decreased coordination, decreased endurance, decreased mobility, difficulty walking, decreased ROM, decreased strength, increased edema, impaired flexibility, improper body mechanics, postural dysfunction, and pain.   REHAB POTENTIAL: Good  CLINICAL DECISION MAKING: Stable/uncomplicated  EVALUATION COMPLEXITY: Low   GOALS: Goals reviewed with patient? Yes  SHORT TERM GOALS: Target date: 06/02/23 Independent with advanced HEP Goal status: met 06/07/23  LONG TERM GOALS: Target date: 08/21/23  Independent with advanced HEP with caregiver Goal status: progressing 10/30/23  2.  Transfer with set up and CGA Goal status: Ongoing 10/30/23 min/A  3.  Walk HHA x 100 feet Goal status: Progressing on 10/30/23 she was able to go to 100 feet, has sto stop due to knee pain and fatigue  4.  Increase left LE strength to 4/5 Goal status: Met 07/31/23  5.  Report neck and shoulder pain decreased 50% Goal status: Progressing 30% 10/25/23  PLAN:  PT FREQUENCY: 1-2x/week  PT DURATION: 12 weeks  PLANNED INTERVENTIONS: Therapeutic exercises, Therapeutic activity, Neuromuscular re-education,  Balance training, Gait training, Patient/Family education, Self Care, Joint mobilization, Moist heat, Taping, and Manual therapy  PLAN FOR NEXT SESSION:  progress walking as tolerated   Jearld Lesch, PT

## 2023-10-30 NOTE — Addendum Note (Signed)
 Addended by: Jearld Lesch on: 10/30/2023 11:58 AM   Modules accepted: Orders

## 2023-11-01 ENCOUNTER — Ambulatory Visit: Payer: Medicare Other | Admitting: Physical Therapy

## 2023-11-01 DIAGNOSIS — M25572 Pain in left ankle and joints of left foot: Secondary | ICD-10-CM

## 2023-11-01 DIAGNOSIS — R262 Difficulty in walking, not elsewhere classified: Secondary | ICD-10-CM

## 2023-11-01 DIAGNOSIS — I6990 Unspecified sequelae of unspecified cerebrovascular disease: Secondary | ICD-10-CM

## 2023-11-01 DIAGNOSIS — M542 Cervicalgia: Secondary | ICD-10-CM | POA: Diagnosis not present

## 2023-11-01 DIAGNOSIS — M25512 Pain in left shoulder: Secondary | ICD-10-CM

## 2023-11-01 DIAGNOSIS — M6281 Muscle weakness (generalized): Secondary | ICD-10-CM | POA: Diagnosis not present

## 2023-11-01 NOTE — Therapy (Signed)
 OUTPATIENT PHYSICAL THERAPY LOWER EXTREMITY TREATMENT      Patient Name: Karen Jackson MRN: [id number] DOB:1940/08/31, 83 y.o., female Today's Date: 11/01/2023  END OF SESSION:  PT End of Session - 11/01/23 1054     Visit Number 44    Date for PT Re-Evaluation 11/30/23    PT Start Time 1100    PT Stop Time 1145    PT Time Calculation (min) 45 min             Past Medical History:  Diagnosis Date   Acute cystitis without hematuria    Acute diastolic CHF (congestive heart failure) (HCC)    Arthritis    Dyspnea    Dysrhythmia    Fever of unknown origin 03/19/2017   Hyperlipidemia    Hypertension    denies at preop   Multifocal pneumonia    Neuromuscular disorder (HCC)    neuropathy left arm and foot   Osteopenia    Paralysis (HCC)    partial left side from CVA    Persistent atrial fibrillation (HCC)    PONV (postoperative nausea and vomiting)    Pre-diabetes    Stroke [facility]) 2013   hemmorahgic   Past Surgical History:  Procedure Laterality Date   ANKLE SURGERY     APPENDECTOMY     CHOLECYSTECTOMY     HARDWARE REMOVAL Left 03/29/2023   Procedure: HARDWARE REMOVAL;  Surgeon: Toni Arthurs, MD;  Location: [facility];  Service: Orthopedics;  Laterality: Left;   HERNIA REPAIR     Esophagus   INCISION AND DRAINAGE OF WOUND Left 03/29/2023   Procedure: LEFT ANKLE WOUND IRRIGATION AND DEBRIDEMENT WOUND;  Surgeon: Toni Arthurs, MD;  Location: [facility];  Service: Orthopedics;  Laterality: Left;   JOINT REPLACEMENT     total- right partial- left   MASTECTOMY PARTIAL / LUMPECTOMY  2012   left   ORIF ANKLE FRACTURE Left 07/20/2018   Procedure: OPEN REDUCTION INTERNAL FIXATION (ORIF) ANKLE FRACTURE;  Surgeon: Toni Arthurs, MD;  Location: [facility];  Service: Orthopedics;  Laterality: Left;   TOTAL KNEE ARTHROPLASTY Left 01/27/2019   Procedure: TOTAL KNEE ARTHROPLASTY;  Surgeon: Dannielle Huh, MD;  Location: [facility];  Service: Orthopedics;  Laterality:  Left;   Patient Active Problem List   Diagnosis Date Noted   Goals of care, counseling/discussion 01/17/2022   Grief 01/17/2022   Secondary hypercoagulable state (HCC) 03/16/2021   Dizziness 03/10/2021   Orthostatic hypotension 10/15/2020   Presbycusis of both ears 03/08/2020   Mixed stress and urge urinary incontinence 12/02/2019   Macrocytosis 12/01/2019   Nutritional anemia 12/01/2019   S/P total knee replacement 01/27/2019   Recurrent left knee instability 07/05/2018   Respiratory failure with hypoxia (HCC) 08/30/2017   Hypoxemia    Heart failure with preserved ejection fraction (HCC), Grade 3 diastolic dysfunction 03/26/2017   PAF (paroxysmal atrial fibrillation) (HCC)    Dyspnea 03/19/2017   Encounter for preventive health examination 02/17/2016   Sensorineural hearing loss (SNHL), bilateral 01/26/2016   Hypomagnesemia 04/24/2014   Hemiparesis affecting left side as late effect of cerebrovascular accident (HCC) 04/24/2014   Nontraumatic cerebral hemorrhage (HCC) 04/30/2012   DM (diabetes mellitus) with complications (HCC) 03/04/2010   OSTEOPENIA 01/21/2009   UNSPECIFIED VITAMIN D DEFICIENCY 11/19/2007   HYPERCHOLESTEROLEMIA 10/25/2006   GASTROESOPHAGEAL REFLUX, NO ESOPHAGITIS 10/25/2006   DIVERTICULOSIS OF COLON 10/25/2006   Osteoarthritis 10/25/2006   CERVICAL SPINE DISORDER, NOS 10/25/2006    PCP: Waynard Edwards, MD  REFERRING PROVIDER: Waynard Edwards,  MD  REFERRING DIAG: s/p left ankle ORIF hardware removal  THERAPY DIAG:  Muscle weakness (generalized)  Left shoulder pain, unspecified chronicity  Difficulty in walking, not elsewhere classified  Pain in left ankle and joints of left foot  Late effects of CVA (cerebrovascular accident)  Rationale for Evaluation and Treatment: Rehabilitation  ONSET DATE: 03/29/23  SUBJECTIVE:   SUBJECTIVE STATEMENT: Did not sleep well, everything hurts PERTINENT HISTORY: CVA, CHF, HTN, A-fib, TKA PAIN:  Are you having pain? Yes:  NPRS scale: 5/10 Pain location: left upper trap and neck, left ankle and knee  Pain description: ache sore Aggravating factors: neck and shoulder always hurt, knee and ankle hurt with transfers Relieving factors: massage, heat  PRECAUTIONS: None  RED FLAGS: None   WEIGHT BEARING RESTRICTIONS: No  FALLS:  Has patient fallen in last 6 months? No  LIVING ENVIRONMENT: Lives with: lives alone Lives in: House/apartment Stairs: No Has following equipment at home: Environmental consultant - 2 wheeled, Wheelchair (manual), shower chair, bed side commode, Grab bars, and Ramped entry  OCCUPATION: retired  PLOF: Needs assistance with homemaking, Needs assistance with transfers, and Leisure: plays bridge, she has 11 hours of an aide at home that helps with meals, going to MD's, dressing and bathing, is alone at night  PATIENT GOALS: walk, have less pain, transfer without difficulty  NEXT MD VISIT: none scheduled  OBJECTIVE:   DIAGNOSTIC FINDINGS: none performed  COGNITION: Overall cognitive status: Within functional limits for tasks assessed     SENSATION: WFL POSTURE: rounded shoulders and forward head  PALPATION: Left knee and ankle are swollen and tender, left upper and neck  LOWER EXTREMITY ROM:  Active ROM Right eval Left eval  Left 07/31/23 Left 08/16/23  Hip flexion       Hip extension       Hip abduction       Hip adduction       Hip internal rotation       Hip external rotation       Knee flexion  90 93 96 95  Knee extension  0 0 0 0  Ankle dorsiflexion  5     Ankle plantarflexion  30 44 49 52  Ankle inversion  5     Ankle eversion  0      (Blank rows = not tested)  LOWER EXTREMITY MMT:  MMT Right eval Left eval Left 06/21/23 Left 07/31/23 08/23/23 left 09/25/23  Hip flexion 4- 3+ 4- 4 4- 4-  Hip extension        Hip abduction 4 3+ 4- 4+ 4 4  Hip adduction 4 3+ 4- 4 4- 4  Hip internal rotation        Hip external rotation        Knee flexion 4- 3+ 4 4 4+ 4+  Knee  extension 4- 3+ 4- 4 4 4   Ankle dorsiflexion     4- 4  Ankle plantarflexion     4- 4  Ankle inversion        Ankle eversion         (Blank rows = not tested)  FUNCTIONAL TESTS:  Transfers Max A, left knee gives out and goes into ER and tends to bow into varus, has not walked in about 18 months, and the last time I saw her we could walk about 60 feet with HHA and w/c following  GAIT: Distance walked: unable   TODAY'S TREATMENT:  DATE:  11/01/23 Nustep L 5 7 min Amb HHA 110 feet min A with w/c behind- had to stop more SOB verse LE fatigue Standing HHA 2# marching,hip flex.hip abd 10 x LAQ 2lb 2x15 each  HS curls green 2x15 each Approximation to L digits Thera gun to upper trap followed by STW    10/30/23 Approximation to L digits Thera gun to upper trap NuStep L 5 x 6:28 min 500 steps  HS curls green 2x15 each LLE hip add green 2x10 LAQ 2lb 2x15 each  Standing march 2lb x10 Gait with HHA and w/c follow 100 feet, then 45 feet  Checked goals  10/25/23 Standing weight shifts Standing small marches Red tband right arm rows and extension Green tband left ankle PF/DF Green tband HS curls Red tband left hip adduction STM to the bilateral upper traps and neck area Gait with HHA and w/c follow 100 feet  10/23/23 Nustep level 5 x 7 minutes Approximation left fingers and wrist STM to the left upper trap and neck area Standing weight shifts Partial sit ups Sliding board and towel left hip abduction Red tband left hip adduction Green tband HS curls Green tband ankle PF/DF Gait with HHA and w/c follow 82 feet, then rest and 70 feet  10/11/23 Nustep level 5 x 8 minutes Red tband left hip adduction Red tband left hip abduction Left HS green tband curls Red tband ankle exercises Standing weight shift and pre gait activities Partial sit ups HHA gait  with w/c follow x 80 feet, had some knee pain today, but mostly c/o shoulder pain STM to the upper traps and neck area, very tight and sore today  10/09/23 Approximation with motion of the fingers STM to the left upper trap and neck and into rhomboids Core work partial sit ups 5# LAQ 5# marches Green tband HS curls Green Tband hip adduction and abduction Green tband ankle exercises Gait with HHA and w/c follow 100 feet, rest and then 50 feet All transfers today were with set up and HHA, much safer  10/04/23 5# LAQ 5# marches Red tband hip adduction and abduction STM to the left upper trap, rhomboid and neck Red tband HS curls Adjusted the new brace as well as the old brace, both had poor velcro and I tried to put new velcro in, with the goal to protect the malleoli due to her issues in the past, showed and educated her on what to look for to help keep the velcro in place Gait with HHA and w/c follow x 75 feet  10/02/23 STM with t-gun to the upper traps and neck Approximation to L digits NuStep 7 min L5 526 steps 5# LAQ 5# march HS curls green 2x10 Hip add green  R ankle DF and PF green x 10 Gait with w/c follow and HHA 70 feet  09/27/23 5# LAQ 5# march Green tband HS curl Green tband hip adduction Nustep level 5 x 10 minutes asked her to do 600 steps Gait with HHA and w/c follow x 10 feet, standing weight shifts Ankle brace adjustment  09/25/23 Nustep level 5 x 8 minutes STM to the left upper trap, rhomboid and neck Weighed her at 147# with two people helping Gait with w/c follow and HHA 69 feet, rest and then 60 feet Green tband HS curls Green tband ankle exercises Red tband hip adduction Red tband hip clamshells 5# LAQ 5# marches Standing weight shifts and orking on posture  09/20/23 Green tband ankle all motions  green tband hip abduction and adduction, focus on adduction increased reps and sets green tband HS curls Nustep level 5 x 500 steps 8 minutes, more  difficulty today keeping the knee in, required cues STM to the left and right upper trap and neck area due to pain and tightness Gait with HHA and w/c follow 63', then repeated and did 52'  PATIENT EDUCATION:  Education details: POC Person educated: Patient Education method: Explanation Education comprehension: verbalized understanding  HOME EXERCISE PROGRAM: 10/11/23 went over with patient and caregiver regarding brace placement and what to look for as far as the padding moving, ankle pumps, tband HS curls  ASSESSMENT:  CLINICAL IMPRESSION: Again patient has been pretty consistent with the ability to walk 80+ feet, but toay we were able to do 110 feet with max encouragement- more SOB vs LE weakness/pain. Added more HHA standing ex and she needed max cuing to maintain right and stay standing as she fatigued easily and fearful of falling She seems to really be moving better.   OBJECTIVE IMPAIRMENTS: Abnormal gait, cardiopulmonary status limiting activity, decreased activity tolerance, decreased balance, decreased coordination, decreased endurance, decreased mobility, difficulty walking, decreased ROM, decreased strength, increased edema, impaired flexibility, improper body mechanics, postural dysfunction, and pain.   REHAB POTENTIAL: Good  CLINICAL DECISION MAKING: Stable/uncomplicated  EVALUATION COMPLEXITY: Low   GOALS: Goals reviewed with patient? Yes  SHORT TERM GOALS: Target date: 06/02/23 Independent with advanced HEP Goal status: met 06/07/23  LONG TERM GOALS: Target date: 08/21/23  Independent with advanced HEP with caregiver Goal status: progressing 10/30/23  2.  Transfer with set up and CGA Goal status: Ongoing 10/30/23 min/A  3.  Walk HHA x 100 feet Goal status: Progressing on 10/30/23 she was able to go to 100 feet, has sto stop due to knee pain and fatigue  4.  Increase left LE strength to 4/5 Goal status: Met 07/31/23  5.  Report neck and shoulder pain decreased  50% Goal status: Progressing 30% 10/25/23  PLAN:  PT FREQUENCY: 1-2x/week  PT DURATION: 12 weeks  PLANNED INTERVENTIONS: Therapeutic exercises, Therapeutic activity, Neuromuscular re-education, Balance training, Gait training, Patient/Family education, Self Care, Joint mobilization, Moist heat, Taping, and Manual therapy  PLAN FOR NEXT SESSION:  progress walking as tolerated   Destenie Ingber,ANGIE, PTA [facility] at [facility] [address] [facility]. [place], [place], [address] Phone: [phone]   Fax:  [phone]  Patient Details  Name: Karen Jackson MRN: [id number] Date of Birth: [birth year] Referring Provider:  Rodrigo Ran, MD  Encounter Date: 11/01/2023   Suanne Marker, PTA 11/01/2023, 10:55 AM  [place] [facility] at [facility] [address] [place], [place], [address] Phone: [phone]   Fax:  [phone]

## 2023-11-06 ENCOUNTER — Encounter: Payer: Self-pay | Admitting: Physical Therapy

## 2023-11-06 ENCOUNTER — Ambulatory Visit: Payer: Medicare Other | Admitting: Physical Therapy

## 2023-11-06 DIAGNOSIS — M542 Cervicalgia: Secondary | ICD-10-CM | POA: Diagnosis not present

## 2023-11-06 DIAGNOSIS — R262 Difficulty in walking, not elsewhere classified: Secondary | ICD-10-CM

## 2023-11-06 DIAGNOSIS — R293 Abnormal posture: Secondary | ICD-10-CM

## 2023-11-06 DIAGNOSIS — I6990 Unspecified sequelae of unspecified cerebrovascular disease: Secondary | ICD-10-CM | POA: Diagnosis not present

## 2023-11-06 DIAGNOSIS — M6281 Muscle weakness (generalized): Secondary | ICD-10-CM | POA: Diagnosis not present

## 2023-11-06 DIAGNOSIS — M25572 Pain in left ankle and joints of left foot: Secondary | ICD-10-CM | POA: Diagnosis not present

## 2023-11-06 DIAGNOSIS — M25512 Pain in left shoulder: Secondary | ICD-10-CM

## 2023-11-06 DIAGNOSIS — I69354 Hemiplegia and hemiparesis following cerebral infarction affecting left non-dominant side: Secondary | ICD-10-CM

## 2023-11-06 NOTE — Therapy (Signed)
 OUTPATIENT PHYSICAL THERAPY LOWER EXTREMITY TREATMENT      Patient Name: Karen Jackson MRN: [id number] DOB:03/18/1941, 83 y.o., female Today's Date: 11/06/2023  END OF SESSION:  PT End of Session - 11/06/23 1101     Visit Number 45    Date for PT Re-Evaluation 11/30/23    PT Start Time 1058    PT Stop Time 1143    PT Time Calculation (min) 45 min    Activity Tolerance Patient tolerated treatment well    Behavior During Therapy [facility] for tasks assessed/performed             Past Medical History:  Diagnosis Date   Acute cystitis without hematuria    Acute diastolic CHF (congestive heart failure) (HCC)    Arthritis    Dyspnea    Dysrhythmia    Fever of unknown origin 03/19/2017   Hyperlipidemia    Hypertension    denies at preop   Multifocal pneumonia    Neuromuscular disorder (HCC)    neuropathy left arm and foot   Osteopenia    Paralysis (HCC)    partial left side from CVA    Persistent atrial fibrillation (HCC)    PONV (postoperative nausea and vomiting)    Pre-diabetes    Stroke [facility]) 2013   hemmorahgic   Past Surgical History:  Procedure Laterality Date   ANKLE SURGERY     APPENDECTOMY     CHOLECYSTECTOMY     HARDWARE REMOVAL Left 03/29/2023   Procedure: HARDWARE REMOVAL;  Surgeon: Toni Arthurs, MD;  Location: [facility];  Service: Orthopedics;  Laterality: Left;   HERNIA REPAIR     Esophagus   INCISION AND DRAINAGE OF WOUND Left 03/29/2023   Procedure: LEFT ANKLE WOUND IRRIGATION AND DEBRIDEMENT WOUND;  Surgeon: Toni Arthurs, MD;  [facility];  Service: Orthopedics;  Laterality: Left;   JOINT REPLACEMENT     total- right partial- left   MASTECTOMY PARTIAL / LUMPECTOMY  2012   left   ORIF ANKLE FRACTURE Left 07/20/2018   Procedure: OPEN REDUCTION INTERNAL FIXATION (ORIF) ANKLE FRACTURE;  Surgeon: Toni Arthurs, MD;  Location: [facility];  Service: Orthopedics;  Laterality: Left;   TOTAL KNEE ARTHROPLASTY Left 01/27/2019    Procedure: TOTAL KNEE ARTHROPLASTY;  Surgeon: Dannielle Huh, MD;  Location: [facility];  Service: Orthopedics;  Laterality: Left;   Patient Active Problem List   Diagnosis Date Noted   Goals of care, counseling/discussion 01/17/2022   Grief 01/17/2022   Secondary hypercoagulable state (HCC) 03/16/2021   Dizziness 03/10/2021   Orthostatic hypotension 10/15/2020   Presbycusis of both ears 03/08/2020   Mixed stress and urge urinary incontinence 12/02/2019   Macrocytosis 12/01/2019   Nutritional anemia 12/01/2019   S/P total knee replacement 01/27/2019   Recurrent left knee instability 07/05/2018   Respiratory failure with hypoxia (HCC) 08/30/2017   Hypoxemia    Heart failure with preserved ejection fraction (HCC), Grade 3 diastolic dysfunction 03/26/2017   PAF (paroxysmal atrial fibrillation) (HCC)    Dyspnea 03/19/2017   Encounter for preventive health examination 02/17/2016   Sensorineural hearing loss (SNHL), bilateral 01/26/2016   Hypomagnesemia 04/24/2014   Hemiparesis affecting left side as late effect of cerebrovascular accident (HCC) 04/24/2014   Nontraumatic cerebral hemorrhage (HCC) 04/30/2012   DM (diabetes mellitus) with complications (HCC) 03/04/2010   OSTEOPENIA 01/21/2009   UNSPECIFIED VITAMIN D DEFICIENCY 11/19/2007   HYPERCHOLESTEROLEMIA 10/25/2006   GASTROESOPHAGEAL REFLUX, NO ESOPHAGITIS 10/25/2006   DIVERTICULOSIS OF COLON 10/25/2006  Osteoarthritis 10/25/2006   CERVICAL SPINE DISORDER, NOS 10/25/2006    PCP: Waynard Edwards, MD  REFERRING PROVIDER: Waynard Edwards, MD  REFERRING DIAG: s/p left ankle ORIF hardware removal  THERAPY DIAG:  Muscle weakness (generalized)  Left shoulder pain, unspecified chronicity  Difficulty in walking, not elsewhere classified  Pain in left ankle and joints of left foot  Late effects of CVA (cerebrovascular accident)  Cervicalgia  Hemiplegia and hemiparesis following cerebral infarction affecting left non-dominant side (HCC)  Abnormal  posture  Rationale for Evaluation and Treatment: Rehabilitation  ONSET DATE: 03/29/23  SUBJECTIVE:   SUBJECTIVE STATEMENT: Reports that she did not have help on Saturday and was in bed all day, reports that her feet were purple on Sunday morning, reports that she feels it was just a lack of movements PERTINENT HISTORY: CVA, CHF, HTN, A-fib, TKA PAIN:  Are you having pain? Yes: NPRS scale: 5/10 Pain location: left upper trap and neck, left ankle and knee  Pain description: ache sore Aggravating factors: neck and shoulder always hurt, knee and ankle hurt with transfers Relieving factors: massage, heat  PRECAUTIONS: None  RED FLAGS: None   WEIGHT BEARING RESTRICTIONS: No  FALLS:  Has patient fallen in last 6 months? No  LIVING ENVIRONMENT: Lives with: lives alone Lives in: House/apartment Stairs: No Has following equipment at home: Environmental consultant - 2 wheeled, Wheelchair (manual), shower chair, bed side commode, Grab bars, and Ramped entry  OCCUPATION: retired  PLOF: Needs assistance with homemaking, Needs assistance with transfers, and Leisure: plays bridge, she has 11 hours of an aide at home that helps with meals, going to MD's, dressing and bathing, is alone at night  PATIENT GOALS: walk, have less pain, transfer without difficulty  NEXT MD VISIT: none scheduled  OBJECTIVE:   DIAGNOSTIC FINDINGS: none performed  COGNITION: Overall cognitive status: Within functional limits for tasks assessed     SENSATION: WFL POSTURE: rounded shoulders and forward head  PALPATION: Left knee and ankle are swollen and tender, left upper and neck  LOWER EXTREMITY ROM:  Active ROM Right eval Left eval  Left 07/31/23 Left 08/16/23  Hip flexion       Hip extension       Hip abduction       Hip adduction       Hip internal rotation       Hip external rotation       Knee flexion  90 93 96 95  Knee extension  0 0 0 0  Ankle dorsiflexion  5     Ankle plantarflexion  30 44 49 52   Ankle inversion  5     Ankle eversion  0      (Blank rows = not tested)  LOWER EXTREMITY MMT:  MMT Right eval Left eval Left 06/21/23 Left 07/31/23 08/23/23 left 09/25/23  Hip flexion 4- 3+ 4- 4 4- 4-  Hip extension        Hip abduction 4 3+ 4- 4+ 4 4  Hip adduction 4 3+ 4- 4 4- 4  Hip internal rotation        Hip external rotation        Knee flexion 4- 3+ 4 4 4+ 4+  Knee extension 4- 3+ 4- 4 4 4   Ankle dorsiflexion     4- 4  Ankle plantarflexion     4- 4  Ankle inversion        Ankle eversion         (Blank rows = not tested)  FUNCTIONAL TESTS:  Transfers Max A, left knee gives out and goes into ER and tends to bow into varus, has not walked in about 18 months, and the last time I saw her we could walk about 60 feet with HHA and w/c following  GAIT: Distance walked: unable   TODAY'S TREATMENT:                                                                                                                              DATE: 11/06/23 Nustep level 5 x 8 minutes Supine bridges 3x10 Supine left hip abduction with sliding board 3x10 Left hip adduction with tband in hooklying 3x10 Supine left hip extension with blue tband 3x10 Supine hooklying knees tied together trunk rotation Red tband ankle PF/DF 2x15 STM to the upper traps and the neck area Gait HHA with w/c follow x 140 feet  11/01/23 Nustep L 5 7 min Amb HHA 110 feet min A with w/c behind- had to stop more SOB verse LE fatigue Standing HHA 2# marching,hip flex.hip abd 10 x LAQ 2lb 2x15 each  HS curls green 2x15 each Approximation to L digits Thera gun to upper trap followed by STW  10/30/23 Approximation to L digits Thera gun to upper trap NuStep L 5 x 6:28 min 500 steps  HS curls green 2x15 each LLE hip add green 2x10 LAQ 2lb 2x15 each  Standing march 2lb x10 Gait with HHA and w/c follow 100 feet, then 45 feet  Checked goals  10/25/23 Standing weight shifts Standing small marches Red tband right arm rows  and extension Green tband left ankle PF/DF Green tband HS curls Red tband left hip adduction STM to the bilateral upper traps and neck area Gait with HHA and w/c follow 100 feet  10/23/23 Nustep level 5 x 7 minutes Approximation left fingers and wrist STM to the left upper trap and neck area Standing weight shifts Partial sit ups Sliding board and towel left hip abduction Red tband left hip adduction Green tband HS curls Green tband ankle PF/DF Gait with HHA and w/c follow 82 feet, then rest and 70 feet  10/11/23 Nustep level 5 x 8 minutes Red tband left hip adduction Red tband left hip abduction Left HS green tband curls Red tband ankle exercises Standing weight shift and pre gait activities Partial sit ups HHA gait with w/c follow x 80 feet, had some knee pain today, but mostly c/o shoulder pain STM to the upper traps and neck area, very tight and sore today  10/09/23 Approximation with motion of the fingers STM to the left upper trap and neck and into rhomboids Core work partial sit ups 5# LAQ 5# marches Green tband HS curls Green Tband hip adduction and abduction Green tband ankle exercises Gait with HHA and w/c follow 100 feet, rest and then 50 feet All transfers today were with set up and HHA, much safer PATIENT EDUCATION:  Education details: POC Person educated:  Patient Education method: Explanation Education comprehension: verbalized understanding  HOME EXERCISE PROGRAM: 10/11/23 went over with patient and caregiver regarding brace placement and what to look for as far as the padding moving, ankle pumps, tband HS curls  ASSESSMENT:  CLINICAL IMPRESSION: Patient continues to improve, she tolerated 140 feet of ambulation today, without much fatigue and with minimal c/o pain.  She had an instance this weekend without a caregiver and had some foot issues, discussed with her about the need to do ankle pumps in that situation and if she is in bed for long  periods  OBJECTIVE IMPAIRMENTS: Abnormal gait, cardiopulmonary status limiting activity, decreased activity tolerance, decreased balance, decreased coordination, decreased endurance, decreased mobility, difficulty walking, decreased ROM, decreased strength, increased edema, impaired flexibility, improper body mechanics, postural dysfunction, and pain.   REHAB POTENTIAL: Good  CLINICAL DECISION MAKING: Stable/uncomplicated  EVALUATION COMPLEXITY: Low   GOALS: Goals reviewed with patient? Yes  SHORT TERM GOALS: Target date: 06/02/23 Independent with advanced HEP Goal status: met 06/07/23  LONG TERM GOALS: Target date: 08/21/23  Independent with advanced HEP with caregiver Goal status: progressing 10/30/23  2.  Transfer with set up and CGA Goal status: Ongoing 10/30/23 min/A  3.  Walk HHA x 100 feet Goal status: Progressing on 10/30/23 she was able to go to 100 feet, has sto stop due to knee pain and fatigue  4.  Increase left LE strength to 4/5 Goal status: Met 07/31/23  5.  Report neck and shoulder pain decreased 50% Goal status: Progressing 30% 10/25/23  PLAN:  PT FREQUENCY: 1-2x/week  PT DURATION: 12 weeks  PLANNED INTERVENTIONS: Therapeutic exercises, Therapeutic activity, Neuromuscular re-education, Balance training, Gait training, Patient/Family education, Self Care, Joint mobilization, Moist heat, Taping, and Manual therapy  PLAN FOR NEXT SESSION:  progress walking as tolerated, see how she tolerated the supine activities   Jearld Lesch, PT [facility] at [facility] [address] [facility] [address] [place], [place], [address] Phone: [phone]   Fax:  [phone]

## 2023-11-08 ENCOUNTER — Encounter: Payer: Self-pay | Admitting: Physical Therapy

## 2023-11-08 ENCOUNTER — Ambulatory Visit: Payer: Medicare Other | Admitting: Physical Therapy

## 2023-11-08 DIAGNOSIS — M25572 Pain in left ankle and joints of left foot: Secondary | ICD-10-CM

## 2023-11-08 DIAGNOSIS — R262 Difficulty in walking, not elsewhere classified: Secondary | ICD-10-CM | POA: Diagnosis not present

## 2023-11-08 DIAGNOSIS — I6990 Unspecified sequelae of unspecified cerebrovascular disease: Secondary | ICD-10-CM | POA: Diagnosis not present

## 2023-11-08 DIAGNOSIS — I69354 Hemiplegia and hemiparesis following cerebral infarction affecting left non-dominant side: Secondary | ICD-10-CM

## 2023-11-08 DIAGNOSIS — R293 Abnormal posture: Secondary | ICD-10-CM

## 2023-11-08 DIAGNOSIS — M6281 Muscle weakness (generalized): Secondary | ICD-10-CM

## 2023-11-08 DIAGNOSIS — M542 Cervicalgia: Secondary | ICD-10-CM | POA: Diagnosis not present

## 2023-11-08 DIAGNOSIS — M25512 Pain in left shoulder: Secondary | ICD-10-CM | POA: Diagnosis not present

## 2023-11-08 NOTE — Therapy (Signed)
 OUTPATIENT PHYSICAL THERAPY LOWER EXTREMITY TREATMENT      Patient Name: Karen Jackson MRN: [id number] DOB:[birth year], 83 y.o., female Today's Date: 11/08/2023  END OF SESSION:  PT End of Session - 11/08/23 1103     Visit Number 46    Date for PT Re-Evaluation 11/30/23    Authorization Type Medicare 19 this year    PT Start Time 1100    PT Stop Time 1145    PT Time Calculation (min) 45 min    Activity Tolerance Patient tolerated treatment well    Behavior During Therapy WFL for tasks assessed/performed             Past Medical History:  Diagnosis Date   Acute cystitis without hematuria    Acute diastolic CHF (congestive heart failure) (HCC)    Arthritis    Dyspnea    Dysrhythmia    Fever of unknown origin 03/19/2017   Hyperlipidemia    Hypertension    denies at preop   Multifocal pneumonia    Neuromuscular disorder (HCC)    neuropathy left arm and foot   Osteopenia    Paralysis (HCC)    partial left side from CVA    Persistent atrial fibrillation (HCC)    PONV (postoperative nausea and vomiting)    Pre-diabetes    Stroke [facility]) 2013   hemmorahgic   Past Surgical History:  Procedure Laterality Date   ANKLE SURGERY     APPENDECTOMY     CHOLECYSTECTOMY     HARDWARE REMOVAL Left 03/29/2023   Procedure: HARDWARE REMOVAL;  Surgeon: Toni Arthurs, MD;  Location: [facility];  Service: Orthopedics;  Laterality: Left;   HERNIA REPAIR     Esophagus   INCISION AND DRAINAGE OF WOUND Left 03/29/2023   Procedure: LEFT ANKLE WOUND IRRIGATION AND DEBRIDEMENT WOUND;  Surgeon: Toni Arthurs, MD;  Location: [facility];  Service: Orthopedics;  Laterality: Left;   JOINT REPLACEMENT     total- right partial- left   MASTECTOMY PARTIAL / LUMPECTOMY  2012   left   ORIF ANKLE FRACTURE Left 07/20/2018   Procedure: OPEN REDUCTION INTERNAL FIXATION (ORIF) ANKLE FRACTURE;  Surgeon: Toni Arthurs, MD;  [facility]: [facility];  Service: Orthopedics;  Laterality: Left;    TOTAL KNEE ARTHROPLASTY Left 01/27/2019   Procedure: TOTAL KNEE ARTHROPLASTY;  Surgeon: Dannielle Huh, MD;  Location: [facility];  Service: Orthopedics;  Laterality: Left;   Patient Active Problem List   Diagnosis Date Noted   Goals of care, counseling/discussion 01/17/2022   Grief 01/17/2022   Secondary hypercoagulable state (HCC) 03/16/2021   Dizziness 03/10/2021   Orthostatic hypotension 10/15/2020   Presbycusis of both ears 03/08/2020   Mixed stress and urge urinary incontinence 12/02/2019   Macrocytosis 12/01/2019   Nutritional anemia 12/01/2019   S/P total knee replacement 01/27/2019   Recurrent left knee instability 07/05/2018   Respiratory failure with hypoxia (HCC) 08/30/2017   Hypoxemia    Heart failure with preserved ejection fraction (HCC), Grade 3 diastolic dysfunction 03/26/2017   PAF (paroxysmal atrial fibrillation) (HCC)    Dyspnea 03/19/2017   Encounter for preventive health examination 02/17/2016   Sensorineural hearing loss (SNHL), bilateral 01/26/2016   Hypomagnesemia 04/24/2014   Hemiparesis affecting left side as late effect of cerebrovascular accident (HCC) 04/24/2014   Nontraumatic cerebral hemorrhage (HCC) 04/30/2012   DM (diabetes mellitus) with complications (HCC) 03/04/2010   OSTEOPENIA 01/21/2009   UNSPECIFIED VITAMIN D DEFICIENCY 11/19/2007   HYPERCHOLESTEROLEMIA 10/25/2006   GASTROESOPHAGEAL REFLUX, NO ESOPHAGITIS  10/25/2006   DIVERTICULOSIS OF COLON 10/25/2006   Osteoarthritis 10/25/2006   CERVICAL SPINE DISORDER, NOS 10/25/2006    PCP: Waynard Edwards, MD  REFERRING PROVIDER: Waynard Edwards, MD  REFERRING DIAG: s/p left ankle ORIF hardware removal  THERAPY DIAG:  Muscle weakness (generalized)  Left shoulder pain, unspecified chronicity  Difficulty in walking, not elsewhere classified  Pain in left ankle and joints of left foot  Late effects of CVA (cerebrovascular accident)  Cervicalgia  Hemiplegia and hemiparesis following cerebral infarction affecting  left non-dominant side (HCC)  Abnormal posture  Rationale for Evaluation and Treatment: Rehabilitation  ONSET DATE: 03/29/23  SUBJECTIVE:   SUBJECTIVE STATEMENT: Reports that she is really hurting in the right shoulder today PERTINENT HISTORY: CVA, CHF, HTN, A-fib, TKA PAIN:  Are you having pain? Yes: NPRS scale: 8/10 Pain location: left upper trap and neck, left ankle and knee  Pain description: ache sore Aggravating factors: neck and shoulder always hurt, knee and ankle hurt with transfers Relieving factors: massage, heat  PRECAUTIONS: None  RED FLAGS: None   WEIGHT BEARING RESTRICTIONS: No  FALLS:  Has patient fallen in last 6 months? No  LIVING ENVIRONMENT: Lives with: lives alone Lives in: House/apartment Stairs: No Has following equipment at home: Environmental consultant - 2 wheeled, Wheelchair (manual), shower chair, bed side commode, Grab bars, and Ramped entry  OCCUPATION: retired  PLOF: Needs assistance with homemaking, Needs assistance with transfers, and Leisure: plays bridge, she has 11 hours of an aide at home that helps with meals, going to MD's, dressing and bathing, is alone at night  PATIENT GOALS: walk, have less pain, transfer without difficulty  NEXT MD VISIT: none scheduled  OBJECTIVE:   DIAGNOSTIC FINDINGS: none performed  COGNITION: Overall cognitive status: Within functional limits for tasks assessed     SENSATION: WFL POSTURE: rounded shoulders and forward head  PALPATION: Left knee and ankle are swollen and tender, left upper and neck  LOWER EXTREMITY ROM:  Active ROM Right eval Left eval  Left 07/31/23 Left 08/16/23  Hip flexion       Hip extension       Hip abduction       Hip adduction       Hip internal rotation       Hip external rotation       Knee flexion  90 93 96 95  Knee extension  0 0 0 0  Ankle dorsiflexion  5     Ankle plantarflexion  30 44 49 52  Ankle inversion  5     Ankle eversion  0      (Blank rows = not  tested)  LOWER EXTREMITY MMT:  MMT Right eval Left eval Left 06/21/23 Left 07/31/23 08/23/23 left 09/25/23  Hip flexion 4- 3+ 4- 4 4- 4-  Hip extension        Hip abduction 4 3+ 4- 4+ 4 4  Hip adduction 4 3+ 4- 4 4- 4  Hip internal rotation        Hip external rotation        Knee flexion 4- 3+ 4 4 4+ 4+  Knee extension 4- 3+ 4- 4 4 4   Ankle dorsiflexion     4- 4  Ankle plantarflexion     4- 4  Ankle inversion        Ankle eversion         (Blank rows = not tested)  FUNCTIONAL TESTS:  Transfers Max A, left knee gives out and goes into ER  and tends to bow into varus, has not walked in about 18 months, and the last time I saw her we could walk about 60 feet with HHA and w/c following  GAIT: Distance walked: unable   TODAY'S TREATMENT:                                                                                                                              DATE: 11/08/23 Nustep level 5 x 8 minutes STM to both upper traps and the cervical area Gentle PROM of the left shoulder with cues for her to relax Standing weight shifts 5# LAQ 5# marches Green tband HS curls Green tband PF/DF Gait with HHA and w/c follow 144 feet stopped due to fatigue  11/06/23 Nustep level 5 x 8 minutes Supine bridges 3x10 Supine left hip abduction with sliding board 3x10 Left hip adduction with tband in hooklying 3x10 Supine left hip extension with blue tband 3x10 Supine hooklying knees tied together trunk rotation Red tband ankle PF/DF 2x15 STM to the upper traps and the neck area Gait HHA with w/c follow x 140 feet  11/01/23 Nustep L 5 7 min Amb HHA 110 feet min A with w/c behind- had to stop more SOB verse LE fatigue Standing HHA 2# marching,hip flex.hip abd 10 x LAQ 2lb 2x15 each  HS curls green 2x15 each Approximation to L digits Thera gun to upper trap followed by STW  10/30/23 Approximation to L digits Thera gun to upper trap NuStep L 5 x 6:28 min 500 steps  HS curls green 2x15  each LLE hip add green 2x10 LAQ 2lb 2x15 each  Standing march 2lb x10 Gait with HHA and w/c follow 100 feet, then 45 feet  Checked goals  10/25/23 Standing weight shifts Standing small marches Red tband right arm rows and extension Green tband left ankle PF/DF Green tband HS curls Red tband left hip adduction STM to the bilateral upper traps and neck area Gait with HHA and w/c follow 100 feet  10/23/23 Nustep level 5 x 7 minutes Approximation left fingers and wrist STM to the left upper trap and neck area Standing weight shifts Partial sit ups Sliding board and towel left hip abduction Red tband left hip adduction Green tband HS curls Green tband ankle PF/DF Gait with HHA and w/c follow 82 feet, then rest and 70 feet PATIENT EDUCATION:  Education details: POC Person educated: Patient Education method: Explanation Education comprehension: verbalized understanding  HOME EXERCISE PROGRAM: 10/11/23 went over with patient and caregiver regarding brace placement and what to look for as far as the padding moving, ankle pumps, tband HS curls  ASSESSMENT:  CLINICAL IMPRESSION: Patient continues to improve, she tolerated 140 feet of ambulation again today, so over the past few weeks she has been over 100 feet every time we have walked, the limiting factor now being fatigue and not pain, she is still suffering from upper trap and shoulder pain. I feel  like the left ankle is getting a little stronger as is the knee as she is not having pain with walking now  OBJECTIVE IMPAIRMENTS: Abnormal gait, cardiopulmonary status limiting activity, decreased activity tolerance, decreased balance, decreased coordination, decreased endurance, decreased mobility, difficulty walking, decreased ROM, decreased strength, increased edema, impaired flexibility, improper body mechanics, postural dysfunction, and pain.   REHAB POTENTIAL: Good  CLINICAL DECISION MAKING: Stable/uncomplicated  EVALUATION  COMPLEXITY: Low   GOALS: Goals reviewed with patient? Yes  SHORT TERM GOALS: Target date: 06/02/23 Independent with advanced HEP Goal status: met 06/07/23  LONG TERM GOALS: Target date: 08/21/23  Independent with advanced HEP with caregiver Goal status: progressing 10/30/23  2.  Transfer with set up and CGA Goal status: progressing 11/08/23  3.  Walk HHA x 100 feet Goal status: Progressing on 10/30/23 she was able to go to 100 feet, has sto stop due to knee pain and fatigue  4.  Increase left LE strength to 4/5 Goal status: Met 07/31/23  5.  Report neck and shoulder pain decreased 50% Goal status: Progressing 30% 11/08/23  PLAN:  PT FREQUENCY: 1-2x/week  PT DURATION: 12 weeks  PLANNED INTERVENTIONS: Therapeutic exercises, Therapeutic activity, Neuromuscular re-education, Balance training, Gait training, Patient/Family education, Self Care, Joint mobilization, Moist heat, Taping, and Manual therapy  PLAN FOR NEXT SESSION:  progress walking as tolerated, see how she tolerated the supine activities   Jearld Lesch, PT [facility] at [facility]. [place], [place], [address] Phone: [phone]   Fax:  [phone]

## 2023-11-13 ENCOUNTER — Ambulatory Visit: Payer: Medicare Other | Admitting: Physical Therapy

## 2023-11-13 ENCOUNTER — Encounter: Payer: Self-pay | Admitting: Physical Therapy

## 2023-11-13 DIAGNOSIS — R262 Difficulty in walking, not elsewhere classified: Secondary | ICD-10-CM | POA: Diagnosis not present

## 2023-11-13 DIAGNOSIS — M6281 Muscle weakness (generalized): Secondary | ICD-10-CM

## 2023-11-13 DIAGNOSIS — M25512 Pain in left shoulder: Secondary | ICD-10-CM | POA: Diagnosis not present

## 2023-11-13 DIAGNOSIS — M25572 Pain in left ankle and joints of left foot: Secondary | ICD-10-CM

## 2023-11-13 DIAGNOSIS — M542 Cervicalgia: Secondary | ICD-10-CM | POA: Diagnosis not present

## 2023-11-13 DIAGNOSIS — I6990 Unspecified sequelae of unspecified cerebrovascular disease: Secondary | ICD-10-CM | POA: Diagnosis not present

## 2023-11-13 DIAGNOSIS — I69354 Hemiplegia and hemiparesis following cerebral infarction affecting left non-dominant side: Secondary | ICD-10-CM

## 2023-11-13 NOTE — Therapy (Signed)
 OUTPATIENT PHYSICAL THERAPY LOWER EXTREMITY TREATMENT      Patient Name: Karen Jackson MRN: [id number] DOB:October 04, 1940, 83 y.o., female Today's Date: 11/13/2023  END OF SESSION:  PT End of Session - 11/13/23 1101     Visit Number 47    Date for PT Re-Evaluation 11/30/23    Authorization Type Medicare 20 this year    PT Start Time 1100    PT Stop Time 1145    PT Time Calculation (min) 45 min    Behavior During Therapy WFL for tasks assessed/performed             Past Medical History:  Diagnosis Date   Acute cystitis without hematuria    Acute diastolic CHF (congestive heart failure) (HCC)    Arthritis    Dyspnea    Dysrhythmia    Fever of unknown origin 03/19/2017   Hyperlipidemia    Hypertension    denies at preop   Multifocal pneumonia    Neuromuscular disorder (HCC)    neuropathy left arm and foot   Osteopenia    Paralysis (HCC)    partial left side from CVA    Persistent atrial fibrillation (HCC)    PONV (postoperative nausea and vomiting)    Pre-diabetes    Stroke [facility]) 2013   hemmorahgic   Past Surgical History:  Procedure Laterality Date   ANKLE SURGERY     APPENDECTOMY     CHOLECYSTECTOMY     HARDWARE REMOVAL Left 03/29/2023   Procedure: HARDWARE REMOVAL;  Surgeon: Toni Arthurs, MD;  Location: [facility];  Service: Orthopedics;  Laterality: Left;   HERNIA REPAIR     Esophagus   INCISION AND DRAINAGE OF WOUND Left 03/29/2023   Procedure: LEFT ANKLE WOUND IRRIGATION AND DEBRIDEMENT WOUND;  Surgeon: Toni Arthurs, MD;  Location: [facility];  Service: Orthopedics;  Laterality: Left;   JOINT REPLACEMENT     total- right partial- left   MASTECTOMY PARTIAL / LUMPECTOMY  2012   left   ORIF ANKLE FRACTURE Left 07/20/2018   Procedure: OPEN REDUCTION INTERNAL FIXATION (ORIF) ANKLE FRACTURE;  Surgeon: Toni Arthurs, MD;  Location: [facility];  Service: Orthopedics;  Laterality: Left;   TOTAL KNEE ARTHROPLASTY Left 01/27/2019   Procedure:  TOTAL KNEE ARTHROPLASTY;  Surgeon: Dannielle Huh, MD;  Location: [facility];  Service: Orthopedics;  Laterality: Left;   Patient Active Problem List   Diagnosis Date Noted   Goals of care, counseling/discussion 01/17/2022   Grief 01/17/2022   Secondary hypercoagulable state (HCC) 03/16/2021   Dizziness 03/10/2021   Orthostatic hypotension 10/15/2020   Presbycusis of both ears 03/08/2020   Mixed stress and urge urinary incontinence 12/02/2019   Macrocytosis 12/01/2019   Nutritional anemia 12/01/2019   S/P total knee replacement 01/27/2019   Recurrent left knee instability 07/05/2018   Respiratory failure with hypoxia (HCC) 08/30/2017   Hypoxemia    Heart failure with preserved ejection fraction (HCC), Grade 3 diastolic dysfunction 03/26/2017   PAF (paroxysmal atrial fibrillation) (HCC)    Dyspnea 03/19/2017   Encounter for preventive health examination 02/17/2016   Sensorineural hearing loss (SNHL), bilateral 01/26/2016   Hypomagnesemia 04/24/2014   Hemiparesis affecting left side as late effect of cerebrovascular accident (HCC) 04/24/2014   Nontraumatic cerebral hemorrhage (HCC) 04/30/2012   DM (diabetes mellitus) with complications (HCC) 03/04/2010   OSTEOPENIA 01/21/2009   UNSPECIFIED VITAMIN D DEFICIENCY 11/19/2007   HYPERCHOLESTEROLEMIA 10/25/2006   GASTROESOPHAGEAL REFLUX, NO ESOPHAGITIS 10/25/2006   DIVERTICULOSIS OF COLON 10/25/2006  Osteoarthritis 10/25/2006   CERVICAL SPINE DISORDER, NOS 10/25/2006    PCP: Waynard Edwards, MD  REFERRING PROVIDER: Waynard Edwards, MD  REFERRING DIAG: s/p left ankle ORIF hardware removal  THERAPY DIAG:  Muscle weakness (generalized)  Left shoulder pain, unspecified chronicity  Difficulty in walking, not elsewhere classified  Pain in left ankle and joints of left foot  Late effects of CVA (cerebrovascular accident)  Cervicalgia  Hemiplegia and hemiparesis following cerebral infarction affecting left non-dominant side (HCC)  Rationale for  Evaluation and Treatment: Rehabilitation  ONSET DATE: 03/29/23  SUBJECTIVE:   SUBJECTIVE STATEMENT: Reports that her left knee is a little sore today PERTINENT HISTORY: CVA, CHF, HTN, A-fib, TKA PAIN:  Are you having pain? Yes: NPRS scale: 8/10 Pain location: left upper trap and neck, left ankle and knee  Pain description: ache sore Aggravating factors: neck and shoulder always hurt, knee and ankle hurt with transfers Relieving factors: massage, heat  PRECAUTIONS: None  RED FLAGS: None   WEIGHT BEARING RESTRICTIONS: No  FALLS:  Has patient fallen in last 6 months? No  LIVING ENVIRONMENT: Lives with: lives alone Lives in: House/apartment Stairs: No Has following equipment at home: Environmental consultant - 2 wheeled, Wheelchair (manual), shower chair, bed side commode, Grab bars, and Ramped entry  OCCUPATION: retired  PLOF: Needs assistance with homemaking, Needs assistance with transfers, and Leisure: plays bridge, she has 11 hours of an aide at home that helps with meals, going to MD's, dressing and bathing, is alone at night  PATIENT GOALS: walk, have less pain, transfer without difficulty  NEXT MD VISIT: none scheduled  OBJECTIVE:   DIAGNOSTIC FINDINGS: none performed  COGNITION: Overall cognitive status: Within functional limits for tasks assessed     SENSATION: WFL POSTURE: rounded shoulders and forward head  PALPATION: Left knee and ankle are swollen and tender, left upper and neck  LOWER EXTREMITY ROM:  Active ROM Right eval Left eval  Left 07/31/23 Left 08/16/23  Hip flexion       Hip extension       Hip abduction       Hip adduction       Hip internal rotation       Hip external rotation       Knee flexion  90 93 96 95  Knee extension  0 0 0 0  Ankle dorsiflexion  5     Ankle plantarflexion  30 44 49 52  Ankle inversion  5     Ankle eversion  0      (Blank rows = not tested)  LOWER EXTREMITY MMT:  MMT Right eval Left eval Left 06/21/23 Left 07/31/23  08/23/23 left 09/25/23  Hip flexion 4- 3+ 4- 4 4- 4-  Hip extension        Hip abduction 4 3+ 4- 4+ 4 4  Hip adduction 4 3+ 4- 4 4- 4  Hip internal rotation        Hip external rotation        Knee flexion 4- 3+ 4 4 4+ 4+  Knee extension 4- 3+ 4- 4 4 4   Ankle dorsiflexion     4- 4  Ankle plantarflexion     4- 4  Ankle inversion        Ankle eversion         (Blank rows = not tested)  FUNCTIONAL TESTS:  Transfers Max A, left knee gives out and goes into ER and tends to bow into varus, has not walked in about 18 months,  and the last time I saw her we could walk about 60 feet with HHA and w/c following  GAIT: Distance walked: unable   TODAY'S TREATMENT:                                                                                                                              DATE: 11/13/23 With the first transfer today her left knee went out to the side, poor control and c/o pain Nustep level 5 x 8 minutes Standing weight shifts STM to the upper traps and the neck area LEft leg 5# LAQ Red tband left hip abduction and adduction Red tband ankle PF/DF Green tband HS curls Gait with HHA and w/C follow 130 feet some difficulty with the left knee with soreness and a little more instability than normal  11/08/23 Nustep level 5 x 8 minutes STM to both upper traps and the cervical area Gentle PROM of the left shoulder with cues for her to relax Standing weight shifts 5# LAQ 5# marches Green tband HS curls Green tband PF/DF Gait with HHA and w/c follow 144 feet stopped due to fatigue  11/06/23 Nustep level 5 x 8 minutes Supine bridges 3x10 Supine left hip abduction with sliding board 3x10 Left hip adduction with tband in hooklying 3x10 Supine left hip extension with blue tband 3x10 Supine hooklying knees tied together trunk rotation Red tband ankle PF/DF 2x15 STM to the upper traps and the neck area Gait HHA with w/c follow x 140 feet  11/01/23 Nustep L 5 7 min Amb HHA 110  feet min A with w/c behind- had to stop more SOB verse LE fatigue Standing HHA 2# marching,hip flex.hip abd 10 x LAQ 2lb 2x15 each  HS curls green 2x15 each Approximation to L digits Thera gun to upper trap followed by STW  10/30/23 Approximation to L digits Thera gun to upper trap NuStep L 5 x 6:28 min 500 steps  HS curls green 2x15 each LLE hip add green 2x10 LAQ 2lb 2x15 each  Standing march 2lb x10 Gait with HHA and w/c follow 100 feet, then 45 feet  Checked goals  10/25/23 Standing weight shifts Standing small marches Red tband right arm rows and extension Green tband left ankle PF/DF Green tband HS curls Red tband left hip adduction STM to the bilateral upper traps and neck area Gait with HHA and w/c follow 100 feet  10/23/23 Nustep level 5 x 7 minutes Approximation left fingers and wrist STM to the left upper trap and neck area Standing weight shifts Partial sit ups Sliding board and towel left hip abduction Red tband left hip adduction Green tband HS curls Green tband ankle PF/DF Gait with HHA and w/c follow 82 feet, then rest and 70 feet PATIENT EDUCATION:  Education details: POC Person educated: Patient Education method: Explanation Education comprehension: verbalized understanding  HOME EXERCISE PROGRAM: 10/11/23 went over with patient and caregiver regarding brace placement and what to look  for as far as the padding moving, ankle pumps, tband HS curls  ASSESSMENT:  CLINICAL IMPRESSION: Patient with a little soreness and instability of the left knee today, she does report that she was sitting in a chair for about  hours yesterday.   She is still suffering from upper trap and shoulder pain. She has significant spasms and tenderness in the upper traps.  This seems to be from different synergies that happen with left LE activities the shoulders tend to elevate.  OBJECTIVE IMPAIRMENTS: Abnormal gait, cardiopulmonary status limiting activity, decreased activity  tolerance, decreased balance, decreased coordination, decreased endurance, decreased mobility, difficulty walking, decreased ROM, decreased strength, increased edema, impaired flexibility, improper body mechanics, postural dysfunction, and pain.   REHAB POTENTIAL: Good  CLINICAL DECISION MAKING: Stable/uncomplicated  EVALUATION COMPLEXITY: Low   GOALS: Goals reviewed with patient? Yes  SHORT TERM GOALS: Target date: 06/02/23 Independent with advanced HEP Goal status: met 06/07/23  LONG TERM GOALS: Target date: 08/21/23  Independent with advanced HEP with caregiver Goal status: progressing 10/30/23  2.  Transfer with set up and CGA Goal status: progressing 11/08/23  3.  Walk HHA x 100 feet Goal status: Progressing on 10/30/23 she was able to go to 100 feet, has sto stop due to knee pain and fatigue  4.  Increase left LE strength to 4/5 Goal status: Met 07/31/23  5.  Report neck and shoulder pain decreased 50% Goal status: Progressing 30% 11/08/23  PLAN:  PT FREQUENCY: 1-2x/week  PT DURATION: 12 weeks  PLANNED INTERVENTIONS: Therapeutic exercises, Therapeutic activity, Neuromuscular re-education, Balance training, Gait training, Patient/Family education, Self Care, Joint mobilization, Moist heat, Taping, and Manual therapy  PLAN FOR NEXT SESSION:  Continue with supine activities   Jearld Lesch, PT [facility] at [facility]. [place], [place], [address] Phone: [phone]   Fax:  [phone]

## 2023-11-15 ENCOUNTER — Ambulatory Visit: Payer: Medicare Other | Admitting: Physical Therapy

## 2023-11-15 ENCOUNTER — Encounter: Payer: Self-pay | Admitting: Physical Therapy

## 2023-11-15 DIAGNOSIS — R262 Difficulty in walking, not elsewhere classified: Secondary | ICD-10-CM

## 2023-11-15 DIAGNOSIS — M25572 Pain in left ankle and joints of left foot: Secondary | ICD-10-CM

## 2023-11-15 DIAGNOSIS — R293 Abnormal posture: Secondary | ICD-10-CM

## 2023-11-15 DIAGNOSIS — R270 Ataxia, unspecified: Secondary | ICD-10-CM

## 2023-11-15 DIAGNOSIS — M542 Cervicalgia: Secondary | ICD-10-CM | POA: Diagnosis not present

## 2023-11-15 DIAGNOSIS — M25512 Pain in left shoulder: Secondary | ICD-10-CM | POA: Diagnosis not present

## 2023-11-15 DIAGNOSIS — M6281 Muscle weakness (generalized): Secondary | ICD-10-CM | POA: Diagnosis not present

## 2023-11-15 DIAGNOSIS — I6990 Unspecified sequelae of unspecified cerebrovascular disease: Secondary | ICD-10-CM | POA: Diagnosis not present

## 2023-11-15 DIAGNOSIS — I69354 Hemiplegia and hemiparesis following cerebral infarction affecting left non-dominant side: Secondary | ICD-10-CM

## 2023-11-15 NOTE — Therapy (Signed)
 OUTPATIENT PHYSICAL THERAPY LOWER EXTREMITY TREATMENT      Patient Name: Karen Jackson MRN: [id number] DOB:04-27-1941, 83 y.o., female Today's Date: 11/15/2023  END OF SESSION:  PT End of Session - 11/15/23 1102     Visit Number 48    Date for PT Re-Evaluation 11/30/23    Authorization Type Medicare 21 this year    PT Start Time 1059    PT Stop Time 1145    PT Time Calculation (min) 46 min    Activity Tolerance Patient tolerated treatment well    Behavior During Therapy WFL for tasks assessed/performed             Past Medical History:  Diagnosis Date   Acute cystitis without hematuria    Acute diastolic CHF (congestive heart failure) (HCC)    Arthritis    Dyspnea    Dysrhythmia    Fever of unknown origin 03/19/2017   Hyperlipidemia    Hypertension    denies at preop   Multifocal pneumonia    Neuromuscular disorder (HCC)    neuropathy left arm and foot   Osteopenia    Paralysis (HCC)    partial left side from CVA    Persistent atrial fibrillation (HCC)    PONV (postoperative nausea and vomiting)    Pre-diabetes    Stroke [facility]) 2013   hemmorahgic   Past Surgical History:  Procedure Laterality Date   ANKLE SURGERY     APPENDECTOMY     CHOLECYSTECTOMY     HARDWARE REMOVAL Left 03/29/2023   Procedure: HARDWARE REMOVAL;  Surgeon: Toni Arthurs, MD;  Location: [facility];  Service: Orthopedics;  Laterality: Left;   HERNIA REPAIR     Esophagus   INCISION AND DRAINAGE OF WOUND Left 03/29/2023   Procedure: LEFT ANKLE WOUND IRRIGATION AND DEBRIDEMENT WOUND;  Surgeon: Toni Arthurs, MD;  Location: [facility];  Service: Orthopedics;  Laterality: Left;   JOINT REPLACEMENT     total- right partial- left   MASTECTOMY PARTIAL / LUMPECTOMY  2012   left   ORIF ANKLE FRACTURE Left 07/20/2018   Procedure: OPEN REDUCTION INTERNAL FIXATION (ORIF) ANKLE FRACTURE;  Surgeon: Toni Arthurs, MD;  [facility];  Service: Orthopedics;  Laterality: Left;    TOTAL KNEE ARTHROPLASTY Left 01/27/2019   Procedure: TOTAL KNEE ARTHROPLASTY;  Surgeon: Dannielle Huh, MD;  Location: [facility];  Service: Orthopedics;  Laterality: Left;   Patient Active Problem List   Diagnosis Date Noted   Goals of care, counseling/discussion 01/17/2022   Grief 01/17/2022   Secondary hypercoagulable state (HCC) 03/16/2021   Dizziness 03/10/2021   Orthostatic hypotension 10/15/2020   Presbycusis of both ears 03/08/2020   Mixed stress and urge urinary incontinence 12/02/2019   Macrocytosis 12/01/2019   Nutritional anemia 12/01/2019   S/P total knee replacement 01/27/2019   Recurrent left knee instability 07/05/2018   Respiratory failure with hypoxia (HCC) 08/30/2017   Hypoxemia    Heart failure with preserved ejection fraction (HCC), Grade 3 diastolic dysfunction 03/26/2017   PAF (paroxysmal atrial fibrillation) (HCC)    Dyspnea 03/19/2017   Encounter for preventive health examination 02/17/2016   Sensorineural hearing loss (SNHL), bilateral 01/26/2016   Hypomagnesemia 04/24/2014   Hemiparesis affecting left side as late effect of cerebrovascular accident (HCC) 04/24/2014   Nontraumatic cerebral hemorrhage (HCC) 04/30/2012   DM (diabetes mellitus) with complications (HCC) 03/04/2010   OSTEOPENIA 01/21/2009   UNSPECIFIED VITAMIN D DEFICIENCY 11/19/2007   HYPERCHOLESTEROLEMIA 10/25/2006   GASTROESOPHAGEAL REFLUX, NO ESOPHAGITIS  10/25/2006   DIVERTICULOSIS OF COLON 10/25/2006   Osteoarthritis 10/25/2006   CERVICAL SPINE DISORDER, NOS 10/25/2006    PCP: Waynard Edwards, MD  REFERRING PROVIDER: Waynard Edwards, MD  REFERRING DIAG: s/p left ankle ORIF hardware removal  THERAPY DIAG:  Muscle weakness (generalized)  Left shoulder pain, unspecified chronicity  Difficulty in walking, not elsewhere classified  Pain in left ankle and joints of left foot  Late effects of CVA (cerebrovascular accident)  Cervicalgia  Hemiplegia and hemiparesis following cerebral infarction affecting  left non-dominant side (HCC)  Abnormal posture  Ataxia  Rationale for Evaluation and Treatment: Rehabilitation  ONSET DATE: 03/29/23  SUBJECTIVE:   SUBJECTIVE STATEMENT: Doing pretty good, a little sore but not bad PERTINENT HISTORY: CVA, CHF, HTN, A-fib, TKA PAIN:  Are you having pain? Yes: NPRS scale: 8/10 Pain location: left upper trap and neck, left ankle and knee  Pain description: ache sore Aggravating factors: neck and shoulder always hurt, knee and ankle hurt with transfers Relieving factors: massage, heat  PRECAUTIONS: None  RED FLAGS: None   WEIGHT BEARING RESTRICTIONS: No  FALLS:  Has patient fallen in last 6 months? No  LIVING ENVIRONMENT: Lives with: lives alone Lives in: House/apartment Stairs: No Has following equipment at home: Environmental consultant - 2 wheeled, Wheelchair (manual), shower chair, bed side commode, Grab bars, and Ramped entry  OCCUPATION: retired  PLOF: Needs assistance with homemaking, Needs assistance with transfers, and Leisure: plays bridge, she has 11 hours of an aide at home that helps with meals, going to MD's, dressing and bathing, is alone at night  PATIENT GOALS: walk, have less pain, transfer without difficulty  NEXT MD VISIT: none scheduled  OBJECTIVE:   DIAGNOSTIC FINDINGS: none performed  COGNITION: Overall cognitive status: Within functional limits for tasks assessed     SENSATION: WFL POSTURE: rounded shoulders and forward head  PALPATION: Left knee and ankle are swollen and tender, left upper and neck  LOWER EXTREMITY ROM:  Active ROM Right eval Left eval  Left 07/31/23 Left 08/16/23  Hip flexion       Hip extension       Hip abduction       Hip adduction       Hip internal rotation       Hip external rotation       Knee flexion  90 93 96 95  Knee extension  0 0 0 0  Ankle dorsiflexion  5     Ankle plantarflexion  30 44 49 52  Ankle inversion  5     Ankle eversion  0      (Blank rows = not tested)  LOWER  EXTREMITY MMT:  MMT Right eval Left eval Left 06/21/23 Left 07/31/23 08/23/23 left 09/25/23  Hip flexion 4- 3+ 4- 4 4- 4-  Hip extension        Hip abduction 4 3+ 4- 4+ 4 4  Hip adduction 4 3+ 4- 4 4- 4  Hip internal rotation        Hip external rotation        Knee flexion 4- 3+ 4 4 4+ 4+  Knee extension 4- 3+ 4- 4 4 4   Ankle dorsiflexion     4- 4  Ankle plantarflexion     4- 4  Ankle inversion        Ankle eversion         (Blank rows = not tested)  FUNCTIONAL TESTS:  Transfers Max A, left knee gives out and goes into ER  and tends to bow into varus, has not walked in about 18 months, and the last time I saw her we could walk about 60 feet with HHA and w/c following  GAIT: Distance walked: unable   TODAY'S TREATMENT:                                                                                                                              DATE: 11/15/23 Nustep level 5 x 8 minutes Partial sit ups Volley ball working on trunk control and core strength Supine bridges and with sliding board left hip abduction Green tband HS curls 5# LAQ STM to the bilateral upper traps Gait with HHA and w/c follow 135 feet  11/13/23 With the first transfer today her left knee went out to the side, poor control and c/o pain Nustep level 5 x 8 minutes Standing weight shifts STM to the upper traps and the neck area LEft leg 5# LAQ Red tband left hip abduction and adduction Red tband ankle PF/DF Green tband HS curls Gait with HHA and w/C follow 130 feet some difficulty with the left knee with soreness and a little more instability than normal  11/08/23 Nustep level 5 x 8 minutes STM to both upper traps and the cervical area Gentle PROM of the left shoulder with cues for her to relax Standing weight shifts 5# LAQ 5# marches Green tband HS curls Green tband PF/DF Gait with HHA and w/c follow 144 feet stopped due to fatigue  11/06/23 Nustep level 5 x 8 minutes Supine bridges  3x10 Supine left hip abduction with sliding board 3x10 Left hip adduction with tband in hooklying 3x10 Supine left hip extension with blue tband 3x10 Supine hooklying knees tied together trunk rotation Red tband ankle PF/DF 2x15 STM to the upper traps and the neck area Gait HHA with w/c follow x 140 feet  11/01/23 Nustep L 5 7 min Amb HHA 110 feet min A with w/c behind- had to stop more SOB verse LE fatigue Standing HHA 2# marching,hip flex.hip abd 10 x LAQ 2lb 2x15 each  HS curls green 2x15 each Approximation to L digits Thera gun to upper trap followed by STW  10/30/23 Approximation to L digits Thera gun to upper trap NuStep L 5 x 6:28 min 500 steps  HS curls green 2x15 each LLE hip add green 2x10 LAQ 2lb 2x15 each  Standing march 2lb x10 Gait with HHA and w/c follow 100 feet, then 45 feet  Checked goals  10/25/23 Standing weight shifts Standing small marches Red tband right arm rows and extension Green tband left ankle PF/DF Green tband HS curls Red tband left hip adduction STM to the bilateral upper traps and neck area Gait with HHA and w/c follow 100 feet  10/23/23 Nustep level 5 x 7 minutes Approximation left fingers and wrist STM to the left upper trap and neck area Standing weight shifts Partial sit ups Sliding board and towel left  hip abduction Red tband left hip adduction Green tband HS curls Green tband ankle PF/DF Gait with HHA and w/c follow 82 feet, then rest and 70 feet PATIENT EDUCATION:  Education details: POC Person educated: Patient Education method: Explanation Education comprehension: verbalized understanding  HOME EXERCISE PROGRAM: 10/11/23 went over with patient and caregiver regarding brace placement and what to look for as far as the padding moving, ankle pumps, tband HS curls  ASSESSMENT:  CLINICAL IMPRESSION: Patient with a little soreness. With some gentle weight shifts and or mm activity we can get this to subside to some degree.   Her walking is improving with easier motions and faster speed, I do not feel that she can become and independent ambulator but may have OT look at the left arm in the future to see what they think of this.   She is still suffering from upper trap and shoulder pain. She has significant spasms and tenderness in the upper traps.  This seems to be from different synergies that happen with left LE activities the shoulders tend to elevate.  OBJECTIVE IMPAIRMENTS: Abnormal gait, cardiopulmonary status limiting activity, decreased activity tolerance, decreased balance, decreased coordination, decreased endurance, decreased mobility, difficulty walking, decreased ROM, decreased strength, increased edema, impaired flexibility, improper body mechanics, postural dysfunction, and pain.   REHAB POTENTIAL: Good  CLINICAL DECISION MAKING: Stable/uncomplicated  EVALUATION COMPLEXITY: Low   GOALS: Goals reviewed with patient? Yes  SHORT TERM GOALS: Target date: 06/02/23 Independent with advanced HEP Goal status: met 06/07/23  LONG TERM GOALS: Target date: 08/21/23  Independent with advanced HEP with caregiver Goal status: progressing 10/30/23  2.  Transfer with set up and CGA Goal status: progressing 11/15/23  3.  Walk HHA x 100 feet Goal status: Progressing on 10/30/23 she was able to go to 100 feet, has sto stop due to knee pain and fatigue  4.  Increase left LE strength to 4/5 Goal status: Met 07/31/23  5.  Report neck and shoulder pain decreased 50% Goal status: Progressing 30% 11/15/23  PLAN:  PT FREQUENCY: 1-2x/week  PT DURATION: 12 weeks  PLANNED INTERVENTIONS: Therapeutic exercises, Therapeutic activity, Neuromuscular re-education, Balance training, Gait training, Patient/Family education, Self Care, Joint mobilization, Moist heat, Taping, and Manual therapy  PLAN FOR NEXT SESSION:  Continue with supine activities   Jearld Lesch, PT [facility] Outpatient  Rehabilitation at [facility]. [place], [place], [address] Phone: [phone]   Fax:  [phone]

## 2023-11-20 ENCOUNTER — Encounter: Payer: Self-pay | Admitting: Physical Therapy

## 2023-11-20 ENCOUNTER — Ambulatory Visit: Payer: Medicare Other | Admitting: Physical Therapy

## 2023-11-20 DIAGNOSIS — I6990 Unspecified sequelae of unspecified cerebrovascular disease: Secondary | ICD-10-CM | POA: Diagnosis not present

## 2023-11-20 DIAGNOSIS — M25512 Pain in left shoulder: Secondary | ICD-10-CM | POA: Diagnosis not present

## 2023-11-20 DIAGNOSIS — M542 Cervicalgia: Secondary | ICD-10-CM | POA: Diagnosis not present

## 2023-11-20 DIAGNOSIS — M6281 Muscle weakness (generalized): Secondary | ICD-10-CM | POA: Diagnosis not present

## 2023-11-20 DIAGNOSIS — R262 Difficulty in walking, not elsewhere classified: Secondary | ICD-10-CM | POA: Diagnosis not present

## 2023-11-20 DIAGNOSIS — M25572 Pain in left ankle and joints of left foot: Secondary | ICD-10-CM | POA: Diagnosis not present

## 2023-11-20 DIAGNOSIS — I69354 Hemiplegia and hemiparesis following cerebral infarction affecting left non-dominant side: Secondary | ICD-10-CM

## 2023-11-20 NOTE — Therapy (Signed)
 OUTPATIENT PHYSICAL THERAPY LOWER EXTREMITY TREATMENT      Patient Name: Karen Jackson MRN: [id number] DOB:March 05, 1941, 83 y.o., female Today's Date: 11/20/2023  END OF SESSION:  PT End of Session - 11/20/23 1106     Visit Number 49    Date for PT Re-Evaluation 11/30/23    Authorization Type Medicare 22 this year    PT Start Time 1100    PT Stop Time 1145    PT Time Calculation (min) 45 min    Activity Tolerance Patient tolerated treatment well    Behavior During Therapy WFL for tasks assessed/performed             Past Medical History:  Diagnosis Date   Acute cystitis without hematuria    Acute diastolic CHF (congestive heart failure) (HCC)    Arthritis    Dyspnea    Dysrhythmia    Fever of unknown origin 03/19/2017   Hyperlipidemia    Hypertension    denies at preop   Multifocal pneumonia    Neuromuscular disorder (HCC)    neuropathy left arm and foot   Osteopenia    Paralysis (HCC)    partial left side from CVA    Persistent atrial fibrillation (HCC)    PONV (postoperative nausea and vomiting)    Pre-diabetes    Stroke [facility]) 2013   hemmorahgic   Past Surgical History:  Procedure Laterality Date   ANKLE SURGERY     APPENDECTOMY     CHOLECYSTECTOMY     HARDWARE REMOVAL Left 03/29/2023   Procedure: HARDWARE REMOVAL;  Surgeon: Toni Arthurs, MD;  Location: [facility];  Service: Orthopedics;  Laterality: Left;   HERNIA REPAIR     Esophagus   INCISION AND DRAINAGE OF WOUND Left 03/29/2023   Procedure: LEFT ANKLE WOUND IRRIGATION AND DEBRIDEMENT WOUND;  Surgeon: Toni Arthurs, MD;  Location: [facility];  Service: Orthopedics;  Laterality: Left;   JOINT REPLACEMENT     total- right partial- left   MASTECTOMY PARTIAL / LUMPECTOMY  2012   left   ORIF ANKLE FRACTURE Left 07/20/2018   Procedure: OPEN REDUCTION INTERNAL FIXATION (ORIF) ANKLE FRACTURE;  Surgeon: Toni Arthurs, MD;  Location: [facility];  Service: Orthopedics;  Laterality: Left;    TOTAL KNEE ARTHROPLASTY Left 01/27/2019   Procedure: TOTAL KNEE ARTHROPLASTY;  Surgeon: Dannielle Huh, MD;  Location: [facility];  Service: Orthopedics;  Laterality: Left;   Patient Active Problem List   Diagnosis Date Noted   Goals of care, counseling/discussion 01/17/2022   Grief 01/17/2022   Secondary hypercoagulable state (HCC) 03/16/2021   Dizziness 03/10/2021   Orthostatic hypotension 10/15/2020   Presbycusis of both ears 03/08/2020   Mixed stress and urge urinary incontinence 12/02/2019   Macrocytosis 12/01/2019   Nutritional anemia 12/01/2019   S/P total knee replacement 01/27/2019   Recurrent left knee instability 07/05/2018   Respiratory failure with hypoxia (HCC) 08/30/2017   Hypoxemia    Heart failure with preserved ejection fraction (HCC), Grade 3 diastolic dysfunction 03/26/2017   PAF (paroxysmal atrial fibrillation) (HCC)    Dyspnea 03/19/2017   Encounter for preventive health examination 02/17/2016   Sensorineural hearing loss (SNHL), bilateral 01/26/2016   Hypomagnesemia 04/24/2014   Hemiparesis affecting left side as late effect of cerebrovascular accident (HCC) 04/24/2014   Nontraumatic cerebral hemorrhage (HCC) 04/30/2012   DM (diabetes mellitus) with complications (HCC) 03/04/2010   OSTEOPENIA 01/21/2009   UNSPECIFIED VITAMIN D DEFICIENCY 11/19/2007   HYPERCHOLESTEROLEMIA 10/25/2006   GASTROESOPHAGEAL REFLUX, NO ESOPHAGITIS  10/25/2006   DIVERTICULOSIS OF COLON 10/25/2006   Osteoarthritis 10/25/2006   CERVICAL SPINE DISORDER, NOS 10/25/2006    PCP: Waynard Edwards, MD  REFERRING PROVIDER: Waynard Edwards, MD  REFERRING DIAG: s/p left ankle ORIF hardware removal  THERAPY DIAG:  Muscle weakness (generalized)  Left shoulder pain, unspecified chronicity  Difficulty in walking, not elsewhere classified  Pain in left ankle and joints of left foot  Late effects of CVA (cerebrovascular accident)  Cervicalgia  Hemiplegia and hemiparesis following cerebral infarction affecting  left non-dominant side (HCC)  Rationale for Evaluation and Treatment: Rehabilitation  ONSET DATE: 03/29/23  SUBJECTIVE:   SUBJECTIVE STATEMENT: No big issues over the weekend, just sore in the shoulder and neck PERTINENT HISTORY: CVA, CHF, HTN, A-fib, TKA PAIN:  Are you having pain? Yes: NPRS scale: 8/10 Pain location: left upper trap and neck, left ankle and knee  Pain description: ache sore Aggravating factors: neck and shoulder always hurt, knee and ankle hurt with transfers Relieving factors: massage, heat  PRECAUTIONS: None  RED FLAGS: None   WEIGHT BEARING RESTRICTIONS: No  FALLS:  Has patient fallen in last 6 months? No  LIVING ENVIRONMENT: Lives with: lives alone Lives in: House/apartment Stairs: No Has following equipment at home: Environmental consultant - 2 wheeled, Wheelchair (manual), shower chair, bed side commode, Grab bars, and Ramped entry  OCCUPATION: retired  PLOF: Needs assistance with homemaking, Needs assistance with transfers, and Leisure: plays bridge, she has 11 hours of an aide at home that helps with meals, going to MD's, dressing and bathing, is alone at night  PATIENT GOALS: walk, have less pain, transfer without difficulty  NEXT MD VISIT: none scheduled  OBJECTIVE:   DIAGNOSTIC FINDINGS: none performed  COGNITION: Overall cognitive status: Within functional limits for tasks assessed     SENSATION: WFL POSTURE: rounded shoulders and forward head  PALPATION: Left knee and ankle are swollen and tender, left upper and neck  LOWER EXTREMITY ROM:  Active ROM Right eval Left eval  Left 07/31/23 Left 08/16/23  Hip flexion       Hip extension       Hip abduction       Hip adduction       Hip internal rotation       Hip external rotation       Knee flexion  90 93 96 95  Knee extension  0 0 0 0  Ankle dorsiflexion  5     Ankle plantarflexion  30 44 49 52  Ankle inversion  5     Ankle eversion  0      (Blank rows = not tested)  LOWER EXTREMITY  MMT:  MMT Right eval Left eval Left 06/21/23 Left 07/31/23 08/23/23 left 09/25/23  Hip flexion 4- 3+ 4- 4 4- 4-  Hip extension        Hip abduction 4 3+ 4- 4+ 4 4  Hip adduction 4 3+ 4- 4 4- 4  Hip internal rotation        Hip external rotation        Knee flexion 4- 3+ 4 4 4+ 4+  Knee extension 4- 3+ 4- 4 4 4   Ankle dorsiflexion     4- 4  Ankle plantarflexion     4- 4  Ankle inversion        Ankle eversion         (Blank rows = not tested)  FUNCTIONAL TESTS:  Transfers Max A, left knee gives out and goes into ER and  tends to bow into varus, has not walked in about 18 months, and the last time I saw her we could walk about 60 feet with HHA and w/c following  GAIT: Distance walked: unable   TODAY'S TREATMENT:                                                                                                                              DATE: 11/20/23 Nustep level 5 x 8 minutes Approximation of the left fingers STM to the left upper trap and neck Green tband HS curls Green tband left leg adduction Green tband ankle PF/DF 5# marches 5# LAQ Gait HHA with w/c follow 125 feet fatigue limited today  11/15/23 Nustep level 5 x 8 minutes Partial sit ups Volley ball working on trunk control and core strength Supine bridges and with sliding board left hip abduction Green tband HS curls 5# LAQ STM to the bilateral upper traps Gait with HHA and w/c follow 135 feet  11/13/23 With the first transfer today her left knee went out to the side, poor control and c/o pain Nustep level 5 x 8 minutes Standing weight shifts STM to the upper traps and the neck area LEft leg 5# LAQ Red tband left hip abduction and adduction Red tband ankle PF/DF Green tband HS curls Gait with HHA and w/C follow 130 feet some difficulty with the left knee with soreness and a little more instability than normal  11/08/23 Nustep level 5 x 8 minutes STM to both upper traps and the cervical area Gentle PROM of  the left shoulder with cues for her to relax Standing weight shifts 5# LAQ 5# marches Green tband HS curls Green tband PF/DF Gait with HHA and w/c follow 144 feet stopped due to fatigue  11/06/23 Nustep level 5 x 8 minutes Supine bridges 3x10 Supine left hip abduction with sliding board 3x10 Left hip adduction with tband in hooklying 3x10 Supine left hip extension with blue tband 3x10 Supine hooklying knees tied together trunk rotation Red tband ankle PF/DF 2x15 STM to the upper traps and the neck area Gait HHA with w/c follow x 140 feet  11/01/23 Nustep L 5 7 min Amb HHA 110 feet min A with w/c behind- had to stop more SOB verse LE fatigue Standing HHA 2# marching,hip flex.hip abd 10 x LAQ 2lb 2x15 each  HS curls green 2x15 each Approximation to L digits Thera gun to upper trap followed by STW  10/30/23 Approximation to L digits Thera gun to upper trap NuStep L 5 x 6:28 min 500 steps  HS curls green 2x15 each LLE hip add green 2x10 LAQ 2lb 2x15 each  Standing march 2lb x10 Gait with HHA and w/c follow 100 feet, then 45 feet  Checked goals  10/25/23 Standing weight shifts Standing small marches Red tband right arm rows and extension Green tband left ankle PF/DF Green tband HS curls Red tband left hip adduction STM to the  bilateral upper traps and neck area Gait with HHA and w/c follow 100 feet  10/23/23 Nustep level 5 x 7 minutes Approximation left fingers and wrist STM to the left upper trap and neck area Standing weight shifts Partial sit ups Sliding board and towel left hip abduction Red tband left hip adduction Green tband HS curls Green tband ankle PF/DF Gait with HHA and w/c follow 82 feet, then rest and 70 feet PATIENT EDUCATION:  Education details: POC Person educated: Patient Education method: Explanation Education comprehension: verbalized understanding  HOME EXERCISE PROGRAM: 10/11/23 went over with patient and caregiver regarding brace placement  and what to look for as far as the padding moving, ankle pumps, tband HS curls  ASSESSMENT:  CLINICAL IMPRESSION: Patient doing well, continue to talk with her and the caregiver regarding the HEP and walking at home if possible when we discharge, at this time both patient and caregiver have some reservations on this, one being at times there has been consistency issues, the current caregiver is great and would be great to do this with.  Her walking is improving with easier motions and faster speed, I do not feel that she can become and independent ambulator but may have OT look at the left arm in the future to see what they think of this.   She is still suffering from upper trap and shoulder pain. She has significant spasms and tenderness in the upper traps.  This seems to be from different synergies that happen with left LE activities the shoulders tend to elevate.  OBJECTIVE IMPAIRMENTS: Abnormal gait, cardiopulmonary status limiting activity, decreased activity tolerance, decreased balance, decreased coordination, decreased endurance, decreased mobility, difficulty walking, decreased ROM, decreased strength, increased edema, impaired flexibility, improper body mechanics, postural dysfunction, and pain.   REHAB POTENTIAL: Good  CLINICAL DECISION MAKING: Stable/uncomplicated  EVALUATION COMPLEXITY: Low   GOALS: Goals reviewed with patient? Yes  SHORT TERM GOALS: Target date: 06/02/23 Independent with advanced HEP Goal status: met 06/07/23  LONG TERM GOALS: Target date: 08/21/23  Independent with advanced HEP with caregiver Goal status: progressing 10/30/23  2.  Transfer with set up and CGA Goal status: progressing 11/15/23  3.  Walk HHA x 100 feet Goal status: Progressing on 10/30/23 she was able to go to 100 feet, has sto stop due to knee pain and fatigue  4.  Increase left LE strength to 4/5 Goal status: Met 07/31/23  5.  Report neck and shoulder pain decreased 50% Goal status:  Progressing 30% 11/15/23  PLAN:  PT FREQUENCY: 1-2x/week  PT DURATION: 12 weeks  PLANNED INTERVENTIONS: Therapeutic exercises, Therapeutic activity, Neuromuscular re-education, Balance training, Gait training, Patient/Family education, Self Care, Joint mobilization, Moist heat, Taping, and Manual therapy  PLAN FOR NEXT SESSION:  she will be seeing her MD tomorrow, asked her to inquire about OT   Jearld Lesch, PT [facility] Outpatient Rehabilitation at [facility]. [place], [place], [address] Phone: [phone]   Fax:  [phone]

## 2023-11-21 DIAGNOSIS — I69354 Hemiplegia and hemiparesis following cerebral infarction affecting left non-dominant side: Secondary | ICD-10-CM | POA: Diagnosis not present

## 2023-11-21 DIAGNOSIS — E1159 Type 2 diabetes mellitus with other circulatory complications: Secondary | ICD-10-CM | POA: Diagnosis not present

## 2023-11-21 DIAGNOSIS — E785 Hyperlipidemia, unspecified: Secondary | ICD-10-CM | POA: Diagnosis not present

## 2023-11-21 DIAGNOSIS — I4821 Permanent atrial fibrillation: Secondary | ICD-10-CM | POA: Diagnosis not present

## 2023-11-21 DIAGNOSIS — I272 Pulmonary hypertension, unspecified: Secondary | ICD-10-CM | POA: Diagnosis not present

## 2023-11-21 DIAGNOSIS — L89523 Pressure ulcer of left ankle, stage 3: Secondary | ICD-10-CM | POA: Diagnosis not present

## 2023-11-21 DIAGNOSIS — I10 Essential (primary) hypertension: Secondary | ICD-10-CM | POA: Diagnosis not present

## 2023-11-21 DIAGNOSIS — M1990 Unspecified osteoarthritis, unspecified site: Secondary | ICD-10-CM | POA: Diagnosis not present

## 2023-11-21 DIAGNOSIS — D649 Anemia, unspecified: Secondary | ICD-10-CM | POA: Diagnosis not present

## 2023-11-21 DIAGNOSIS — M810 Age-related osteoporosis without current pathological fracture: Secondary | ICD-10-CM | POA: Diagnosis not present

## 2023-11-22 ENCOUNTER — Ambulatory Visit: Payer: Medicare Other | Admitting: Physical Therapy

## 2023-11-22 ENCOUNTER — Encounter: Payer: Self-pay | Admitting: Physical Therapy

## 2023-11-22 DIAGNOSIS — M6281 Muscle weakness (generalized): Secondary | ICD-10-CM | POA: Diagnosis not present

## 2023-11-22 DIAGNOSIS — M25512 Pain in left shoulder: Secondary | ICD-10-CM | POA: Diagnosis not present

## 2023-11-22 DIAGNOSIS — M542 Cervicalgia: Secondary | ICD-10-CM | POA: Diagnosis not present

## 2023-11-22 DIAGNOSIS — R262 Difficulty in walking, not elsewhere classified: Secondary | ICD-10-CM | POA: Diagnosis not present

## 2023-11-22 DIAGNOSIS — I6990 Unspecified sequelae of unspecified cerebrovascular disease: Secondary | ICD-10-CM

## 2023-11-22 DIAGNOSIS — I69354 Hemiplegia and hemiparesis following cerebral infarction affecting left non-dominant side: Secondary | ICD-10-CM

## 2023-11-22 DIAGNOSIS — M25572 Pain in left ankle and joints of left foot: Secondary | ICD-10-CM | POA: Diagnosis not present

## 2023-11-22 NOTE — Therapy (Signed)
 OUTPATIENT PHYSICAL THERAPY LOWER EXTREMITY TREATMENT   Progress Note Reporting Period 10/23/23 to 11/22/23 for visits 41-50  See note below for Objective Data and Assessment of Progress/Goals.       Patient Name: Karen Jackson MRN: [id number] DOB:[birth year], 83 y.o., female Today's Date: 11/22/2023  END OF SESSION:  PT End of Session - 11/22/23 1105     Visit Number 50    Date for PT Re-Evaluation 11/30/23    Authorization Type Medicare 23 this year    PT Start Time 1057    PT Stop Time 1143    PT Time Calculation (min) 46 min    Activity Tolerance Patient tolerated treatment well    Behavior During Therapy WFL for tasks assessed/performed             Past Medical History:  Diagnosis Date   Acute cystitis without hematuria    Acute diastolic CHF (congestive heart failure) (HCC)    Arthritis    Dyspnea    Dysrhythmia    Fever of unknown origin 03/19/2017   Hyperlipidemia    Hypertension    denies at preop   Multifocal pneumonia    Neuromuscular disorder (HCC)    neuropathy left arm and foot   Osteopenia    Paralysis (HCC)    partial left side from CVA    Persistent atrial fibrillation (HCC)    PONV (postoperative nausea and vomiting)    Pre-diabetes    Stroke [facility]) 2013   hemmorahgic   Past Surgical History:  Procedure Laterality Date   ANKLE SURGERY     APPENDECTOMY     CHOLECYSTECTOMY     HARDWARE REMOVAL Left 03/29/2023   Procedure: HARDWARE REMOVAL;  Surgeon: Toni Arthurs, MD;  Location: [facility];  Service: Orthopedics;  Laterality: Left;   HERNIA REPAIR     Esophagus   INCISION AND DRAINAGE OF WOUND Left 03/29/2023   Procedure: LEFT ANKLE WOUND IRRIGATION AND DEBRIDEMENT WOUND;  Surgeon: Toni Arthurs, MD;  Location: [facility];  Service: Orthopedics;  Laterality: Left;   JOINT REPLACEMENT     total- right partial- left   MASTECTOMY PARTIAL / LUMPECTOMY  2012   left   ORIF ANKLE FRACTURE Left 07/20/2018   Procedure:  OPEN REDUCTION INTERNAL FIXATION (ORIF) ANKLE FRACTURE;  Surgeon: Toni Arthurs, MD;  Location: [facility];  Service: Orthopedics;  Laterality: Left;   TOTAL KNEE ARTHROPLASTY Left 01/27/2019   Procedure: TOTAL KNEE ARTHROPLASTY;  Surgeon: Dannielle Huh, MD;  Location: [facility];  Service: Orthopedics;  Laterality: Left;   Patient Active Problem List   Diagnosis Date Noted   Goals of care, counseling/discussion 01/17/2022   Grief 01/17/2022   Secondary hypercoagulable state (HCC) 03/16/2021   Dizziness 03/10/2021   Orthostatic hypotension 10/15/2020   Presbycusis of both ears 03/08/2020   Mixed stress and urge urinary incontinence 12/02/2019   Macrocytosis 12/01/2019   Nutritional anemia 12/01/2019   S/P total knee replacement 01/27/2019   Recurrent left knee instability 07/05/2018   Respiratory failure with hypoxia (HCC) 08/30/2017   Hypoxemia    Heart failure with preserved ejection fraction (HCC), Grade 3 diastolic dysfunction 03/26/2017   PAF (paroxysmal atrial fibrillation) (HCC)    Dyspnea 03/19/2017   Encounter for preventive health examination 02/17/2016   Sensorineural hearing loss (SNHL), bilateral 01/26/2016   Hypomagnesemia 04/24/2014   Hemiparesis affecting left side as late effect of cerebrovascular accident (HCC) 04/24/2014   Nontraumatic cerebral hemorrhage (HCC) 04/30/2012   DM (diabetes mellitus) with  complications (HCC) 03/04/2010   OSTEOPENIA 01/21/2009   UNSPECIFIED VITAMIN D DEFICIENCY 11/19/2007   HYPERCHOLESTEROLEMIA 10/25/2006   GASTROESOPHAGEAL REFLUX, NO ESOPHAGITIS 10/25/2006   DIVERTICULOSIS OF COLON 10/25/2006   Osteoarthritis 10/25/2006   CERVICAL SPINE DISORDER, NOS 10/25/2006    PCP: Waynard Edwards, MD  REFERRING PROVIDER: Waynard Edwards, MD  REFERRING DIAG: s/p left ankle ORIF hardware removal  THERAPY DIAG:  Muscle weakness (generalized)  Left shoulder pain, unspecified chronicity  Difficulty in walking, not elsewhere classified  Pain in left ankle and joints  of left foot  Cervicalgia  Late effects of CVA (cerebrovascular accident)  Hemiplegia and hemiparesis following cerebral infarction affecting left non-dominant side [facility])  Rationale for Evaluation and Treatment: Rehabilitation  ONSET DATE: 03/29/23  SUBJECTIVE:   SUBJECTIVE STATEMENT: MD is pleased will refer to OT PERTINENT HISTORY: CVA, CHF, HTN, A-fib, TKA PAIN:  Are you having pain? Yes: NPRS scale: 6/10 Pain location: left upper trap and neck, left ankle and knee  Pain description: ache sore Aggravating factors: neck and shoulder always hurt, knee and ankle hurt with transfers Relieving factors: massage, heat  PRECAUTIONS: None  RED FLAGS: None   WEIGHT BEARING RESTRICTIONS: No  FALLS:  Has patient fallen in last 6 months? No  LIVING ENVIRONMENT: Lives with: lives alone Lives in: House/apartment Stairs: No Has following equipment at home: Environmental consultant - 2 wheeled, Wheelchair (manual), shower chair, bed side commode, Grab bars, and Ramped entry  OCCUPATION: retired  PLOF: Needs assistance with homemaking, Needs assistance with transfers, and Leisure: plays bridge, she has 11 hours of an aide at home that helps with meals, going to MD's, dressing and bathing, is alone at night  PATIENT GOALS: walk, have less pain, transfer without difficulty  NEXT MD VISIT: none scheduled  OBJECTIVE:   DIAGNOSTIC FINDINGS: none performed  COGNITION: Overall cognitive status: Within functional limits for tasks assessed     SENSATION: WFL POSTURE: rounded shoulders and forward head  PALPATION: Left knee and ankle are swollen and tender, left upper and neck  LOWER EXTREMITY ROM:  Active ROM Right eval Left eval  Left 07/31/23 Left 08/16/23  Hip flexion       Hip extension       Hip abduction       Hip adduction       Hip internal rotation       Hip external rotation       Knee flexion  90 93 96 95  Knee extension  0 0 0 0  Ankle dorsiflexion  5     Ankle  plantarflexion  30 44 49 52  Ankle inversion  5     Ankle eversion  0      (Blank rows = not tested)  LOWER EXTREMITY MMT:  MMT Right eval Left eval Left 06/21/23 Left 07/31/23 08/23/23 left 09/25/23  Hip flexion 4- 3+ 4- 4 4- 4-  Hip extension        Hip abduction 4 3+ 4- 4+ 4 4  Hip adduction 4 3+ 4- 4 4- 4  Hip internal rotation        Hip external rotation        Knee flexion 4- 3+ 4 4 4+ 4+  Knee extension 4- 3+ 4- 4 4 4   Ankle dorsiflexion     4- 4  Ankle plantarflexion     4- 4  Ankle inversion        Ankle eversion         (Blank rows =  not tested)  FUNCTIONAL TESTS:  Transfers Max A, left knee gives out and goes into ER and tends to bow into varus, has not walked in about 18 months, and the last time I saw her we could walk about 60 feet with HHA and w/c following  GAIT: Distance walked: unable   TODAY'S TREATMENT:                                                                                                                              DATE: 11/22/23 Nustep level 5 x 8 minutes Approximation of the fingers STM to the upper traps and the neck  Supine left leg abduction with sliding board Partial sit ups Trunk rotation Bridges Green tband HS curls Gait HHA with w/c follow x 140 feet  11/20/23 Nustep level 5 x 8 minutes Approximation of the left fingers STM to the left upper trap and neck Green tband HS curls Green tband left leg adduction Green tband ankle PF/DF 5# marches 5# LAQ Gait HHA with w/c follow 125 feet fatigue limited today  11/15/23 Nustep level 5 x 8 minutes Partial sit ups Volley ball working on trunk control and core strength Supine bridges and with sliding board left hip abduction Green tband HS curls 5# LAQ STM to the bilateral upper traps Gait with HHA and w/c follow 135 feet  11/13/23 With the first transfer today her left knee went out to the side, poor control and c/o pain Nustep level 5 x 8 minutes Standing weight  shifts STM to the upper traps and the neck area LEft leg 5# LAQ Red tband left hip abduction and adduction Red tband ankle PF/DF Green tband HS curls Gait with HHA and w/C follow 130 feet some difficulty with the left knee with soreness and a little more instability than normal  11/08/23 Nustep level 5 x 8 minutes STM to both upper traps and the cervical area Gentle PROM of the left shoulder with cues for her to relax Standing weight shifts 5# LAQ 5# marches Green tband HS curls Green tband PF/DF Gait with HHA and w/c follow 144 feet stopped due to fatigue  11/06/23 Nustep level 5 x 8 minutes Supine bridges 3x10 Supine left hip abduction with sliding board 3x10 Left hip adduction with tband in hooklying 3x10 Supine left hip extension with blue tband 3x10 Supine hooklying knees tied together trunk rotation Red tband ankle PF/DF 2x15 STM to the upper traps and the neck area Gait HHA with w/c follow x 140 feet  11/01/23 Nustep L 5 7 min Amb HHA 110 feet min A with w/c behind- had to stop more SOB verse LE fatigue Standing HHA 2# marching,hip flex.hip abd 10 x LAQ 2lb 2x15 each  HS curls green 2x15 each Approximation to L digits Thera gun to upper trap followed by STW  10/30/23 Approximation to L digits Thera gun to upper trap NuStep L 5 x 6:28 min 500 steps  HS  curls green 2x15 each LLE hip add green 2x10 LAQ 2lb 2x15 each  Standing march 2lb x10 Gait with HHA and w/c follow 100 feet, then 45 feet PATIENT EDUCATION:  Education details: POC Person educated: Patient Education method: Explanation Education comprehension: verbalized understanding  HOME EXERCISE PROGRAM: 10/11/23 went over with patient and caregiver regarding brace placement and what to look for as far as the padding moving, ankle pumps, tband HS curls  ASSESSMENT:  CLINICAL IMPRESSION: Patient doing well, continue to talk with her and the caregiver regarding the HEP and walking at home if possible when  we discharge. Her MD was very pleased with her levels and pleased that she is moving easier.  She is still suffering from upper trap and shoulder pain. She has significant spasms and tenderness in the upper traps.  This seems to be from different synergies that happen with left LE activities the shoulders tend to elevate. She did tell me that the MD will send in a referral for OT to look at this and see if anything they can do  OBJECTIVE IMPAIRMENTS: Abnormal gait, cardiopulmonary status limiting activity, decreased activity tolerance, decreased balance, decreased coordination, decreased endurance, decreased mobility, difficulty walking, decreased ROM, decreased strength, increased edema, impaired flexibility, improper body mechanics, postural dysfunction, and pain.   REHAB POTENTIAL: Good  CLINICAL DECISION MAKING: Stable/uncomplicated  EVALUATION COMPLEXITY: Low   GOALS: Goals reviewed with patient? Yes  SHORT TERM GOALS: Target date: 06/02/23 Independent with advanced HEP Goal status: met 06/07/23  LONG TERM GOALS: Target date: 08/21/23  Independent with advanced HEP with caregiver Goal status: progressing 11/22/23  2.  Transfer with set up and CGA Goal status: progressing 11/15/23  3.  Walk HHA x 100 feet Goal status: met 11/22/23  4.  Increase left LE strength to 4/5 Goal status: Met 07/31/23  5.  Report neck and shoulder pain decreased 50% Goal status: Progressing 30% 11/15/23  PLAN:  PT FREQUENCY: 1-2x/week  PT DURATION: 12 weeks  PLANNED INTERVENTIONS: Therapeutic exercises, Therapeutic activity, Neuromuscular re-education, Balance training, Gait training, Patient/Family education, Self Care, Joint mobilization, Moist heat, Taping, and Manual therapy  PLAN FOR NEXT SESSION:  Continue for a little longer talk more with the caregiver about the walking and HEP   Jearld Lesch, PT [facility] at [facility]. [place], [place], [address] Phone: [phone]   Fax:  [phone]

## 2023-11-27 ENCOUNTER — Encounter: Payer: Self-pay | Admitting: Physical Therapy

## 2023-11-27 ENCOUNTER — Ambulatory Visit: Attending: Internal Medicine | Admitting: Physical Therapy

## 2023-11-27 DIAGNOSIS — M25572 Pain in left ankle and joints of left foot: Secondary | ICD-10-CM | POA: Insufficient documentation

## 2023-11-27 DIAGNOSIS — I69354 Hemiplegia and hemiparesis following cerebral infarction affecting left non-dominant side: Secondary | ICD-10-CM | POA: Insufficient documentation

## 2023-11-27 DIAGNOSIS — M25512 Pain in left shoulder: Secondary | ICD-10-CM | POA: Insufficient documentation

## 2023-11-27 DIAGNOSIS — R262 Difficulty in walking, not elsewhere classified: Secondary | ICD-10-CM | POA: Insufficient documentation

## 2023-11-27 DIAGNOSIS — R270 Ataxia, unspecified: Secondary | ICD-10-CM | POA: Insufficient documentation

## 2023-11-27 DIAGNOSIS — I6990 Unspecified sequelae of unspecified cerebrovascular disease: Secondary | ICD-10-CM | POA: Diagnosis not present

## 2023-11-27 DIAGNOSIS — M6281 Muscle weakness (generalized): Secondary | ICD-10-CM | POA: Diagnosis not present

## 2023-11-27 DIAGNOSIS — R293 Abnormal posture: Secondary | ICD-10-CM | POA: Diagnosis not present

## 2023-11-27 DIAGNOSIS — M542 Cervicalgia: Secondary | ICD-10-CM | POA: Insufficient documentation

## 2023-11-27 NOTE — Therapy (Signed)
 OUTPATIENT PHYSICAL THERAPY LOWER EXTREMITY TREATMENT   Pro     Patient Name: Karen Jackson MRN: [id number] DOB:04-04-1941, 83 y.o., female Today's Date: 11/27/2023  END OF SESSION:  PT End of Session - 11/27/23 1017     Visit Number 51    Date for PT Re-Evaluation 11/30/23    PT Start Time 1015    PT Stop Time 1100    PT Time Calculation (min) 45 min    Activity Tolerance Patient tolerated treatment well    Behavior During Therapy [facility] for tasks assessed/performed             Past Medical History:  Diagnosis Date   Acute cystitis without hematuria    Acute diastolic CHF (congestive heart failure) (HCC)    Arthritis    Dyspnea    Dysrhythmia    Fever of unknown origin 03/19/2017   Hyperlipidemia    Hypertension    denies at preop   Multifocal pneumonia    Neuromuscular disorder (HCC)    neuropathy left arm and foot   Osteopenia    Paralysis (HCC)    partial left side from CVA    Persistent atrial fibrillation (HCC)    PONV (postoperative nausea and vomiting)    Pre-diabetes    Stroke [facility]) 2013   hemmorahgic   Past Surgical History:  Procedure Laterality Date   ANKLE SURGERY     APPENDECTOMY     CHOLECYSTECTOMY     HARDWARE REMOVAL Left 03/29/2023   Procedure: HARDWARE REMOVAL;  Surgeon: Toni Arthurs, MD;  Location: [facility];  Service: Orthopedics;  Laterality: Left;   HERNIA REPAIR     Esophagus   INCISION AND DRAINAGE OF WOUND Left 03/29/2023   Procedure: LEFT ANKLE WOUND IRRIGATION AND DEBRIDEMENT WOUND;  Surgeon: Toni Arthurs, MD;  Location: [facility];  Service: Orthopedics;  Laterality: Left;   JOINT REPLACEMENT     total- right partial- left   MASTECTOMY PARTIAL / LUMPECTOMY  2012   left   ORIF ANKLE FRACTURE Left 07/20/2018   Procedure: OPEN REDUCTION INTERNAL FIXATION (ORIF) ANKLE FRACTURE;  Surgeon: Toni Arthurs, MD;  Location: [facility];  Service: Orthopedics;  Laterality: Left;   TOTAL KNEE ARTHROPLASTY Left 01/27/2019    Procedure: TOTAL KNEE ARTHROPLASTY;  Surgeon: Dannielle Huh, MD;  Location: [facility];  Service: Orthopedics;  Laterality: Left;   Patient Active Problem List   Diagnosis Date Noted   Goals of care, counseling/discussion 01/17/2022   Grief 01/17/2022   Secondary hypercoagulable state (HCC) 03/16/2021   Dizziness 03/10/2021   Orthostatic hypotension 10/15/2020   Presbycusis of both ears 03/08/2020   Mixed stress and urge urinary incontinence 12/02/2019   Macrocytosis 12/01/2019   Nutritional anemia 12/01/2019   S/P total knee replacement 01/27/2019   Recurrent left knee instability 07/05/2018   Respiratory failure with hypoxia (HCC) 08/30/2017   Hypoxemia    Heart failure with preserved ejection fraction (HCC), Grade 3 diastolic dysfunction 03/26/2017   PAF (paroxysmal atrial fibrillation) (HCC)    Dyspnea 03/19/2017   Encounter for preventive health examination 02/17/2016   Sensorineural hearing loss (SNHL), bilateral 01/26/2016   Hypomagnesemia 04/24/2014   Hemiparesis affecting left side as late effect of cerebrovascular accident (HCC) 04/24/2014   Nontraumatic cerebral hemorrhage (HCC) 04/30/2012   DM (diabetes mellitus) with complications (HCC) 03/04/2010   OSTEOPENIA 01/21/2009   UNSPECIFIED VITAMIN D DEFICIENCY 11/19/2007   HYPERCHOLESTEROLEMIA 10/25/2006   GASTROESOPHAGEAL REFLUX, NO ESOPHAGITIS 10/25/2006   DIVERTICULOSIS OF COLON 10/25/2006  Osteoarthritis 10/25/2006   CERVICAL SPINE DISORDER, NOS 10/25/2006    PCP: Waynard Edwards, MD  REFERRING PROVIDER: Waynard Edwards, MD  REFERRING DIAG: s/p left ankle ORIF hardware removal  THERAPY DIAG:  Muscle weakness (generalized)  Difficulty in walking, not elsewhere classified  Left shoulder pain, unspecified chronicity  Pain in left ankle and joints of left foot  Rationale for Evaluation and Treatment: Rehabilitation  ONSET DATE: 03/29/23  SUBJECTIVE:   SUBJECTIVE STATEMENT: Shoulders hurt, Bruise on the L foot  PERTINENT  HISTORY: CVA, CHF, HTN, A-fib, TKA PAIN:  Are you having pain? Yes: NPRS scale: 7/10 Pain location: left upper trap and neck, left ankle and knee  Pain description: ache sore Aggravating factors: neck and shoulder always hurt, knee and ankle hurt with transfers Relieving factors: massage, heat  PRECAUTIONS: None  RED FLAGS: None   WEIGHT BEARING RESTRICTIONS: No  FALLS:  Has patient fallen in last 6 months? No  LIVING ENVIRONMENT: Lives with: lives alone Lives in: House/apartment Stairs: No Has following equipment at home: Environmental consultant - 2 wheeled, Wheelchair (manual), shower chair, bed side commode, Grab bars, and Ramped entry  OCCUPATION: retired  PLOF: Needs assistance with homemaking, Needs assistance with transfers, and Leisure: plays bridge, she has 11 hours of an aide at home that helps with meals, going to MD's, dressing and bathing, is alone at night  PATIENT GOALS: walk, have less pain, transfer without difficulty  NEXT MD VISIT: none scheduled  OBJECTIVE:   DIAGNOSTIC FINDINGS: none performed  COGNITION: Overall cognitive status: Within functional limits for tasks assessed     SENSATION: WFL POSTURE: rounded shoulders and forward head  PALPATION: Left knee and ankle are swollen and tender, left upper and neck  LOWER EXTREMITY ROM:  Active ROM Right eval Left eval  Left 07/31/23 Left 08/16/23  Hip flexion       Hip extension       Hip abduction       Hip adduction       Hip internal rotation       Hip external rotation       Knee flexion  90 93 96 95  Knee extension  0 0 0 0  Ankle dorsiflexion  5     Ankle plantarflexion  30 44 49 52  Ankle inversion  5     Ankle eversion  0      (Blank rows = not tested)  LOWER EXTREMITY MMT:  MMT Right eval Left eval Left 06/21/23 Left 07/31/23 08/23/23 left 09/25/23  Hip flexion 4- 3+ 4- 4 4- 4-  Hip extension        Hip abduction 4 3+ 4- 4+ 4 4  Hip adduction 4 3+ 4- 4 4- 4  Hip internal rotation         Hip external rotation        Knee flexion 4- 3+ 4 4 4+ 4+  Knee extension 4- 3+ 4- 4 4 4   Ankle dorsiflexion     4- 4  Ankle plantarflexion     4- 4  Ankle inversion        Ankle eversion         (Blank rows = not tested)  FUNCTIONAL TESTS:  Transfers Max A, left knee gives out and goes into ER and tends to bow into varus, has not walked in about 18 months, and the last time I saw her we could walk about 60 feet with HHA and w/c following  GAIT: Distance walked: unable  TODAY'S TREATMENT:                                                                                                                              DATE: 11/27/23 Nustep level 5 x 8 minutes Approximation of the fingers STM to the upper traps and the neck   Some with thera gun  On mat Table  Sit to stands x5 then 10 marches two rounds  HS curls red 2x10 AROM shoulder flex and abd limited orm Gait HHA with w/c follow x 50 feet x2  11/22/23 NuStep level 5 x 8 minutes Approximation of the fingers STM to the upper traps and the neck  Supine left leg abduction with sliding board Partial sit ups Trunk rotation Bridges Green tband HS curls Gait HHA with w/c follow x 140 feet  11/20/23 Nustep level 5 x 8 minutes Approximation of the left fingers STM to the left upper trap and neck Green tband HS curls Green tband left leg adduction Green tband ankle PF/DF 5# marches 5# LAQ Gait HHA with w/c follow 125 feet fatigue limited today  11/15/23 Nustep level 5 x 8 minutes Partial sit ups Volley ball working on trunk control and core strength Supine bridges and with sliding board left hip abduction Green tband HS curls 5# LAQ STM to the bilateral upper traps Gait with HHA and w/c follow 135 feet  11/13/23 With the first transfer today her left knee went out to the side, poor control and c/o pain Nustep level 5 x 8 minutes Standing weight shifts STM to the upper traps and the neck area LEft leg 5# LAQ Red  tband left hip abduction and adduction Red tband ankle PF/DF Green tband HS curls Gait with HHA and w/C follow 130 feet some difficulty with the left knee with soreness and a little more instability than normal  11/08/23 Nustep level 5 x 8 minutes STM to both upper traps and the cervical area Gentle PROM of the left shoulder with cues for her to relax Standing weight shifts 5# LAQ 5# marches Green tband HS curls Green tband PF/DF Gait with HHA and w/c follow 144 feet stopped due to fatigue  11/06/23 Nustep level 5 x 8 minutes Supine bridges 3x10 Supine left hip abduction with sliding board 3x10 Left hip adduction with tband in hooklying 3x10 Supine left hip extension with blue tband 3x10 Supine hooklying knees tied together trunk rotation Red tband ankle PF/DF 2x15 STM to the upper traps and the neck area Gait HHA with w/c follow x 140 feet  11/01/23 Nustep L 5 7 min Amb HHA 110 feet min A with w/c behind- had to stop more SOB verse LE fatigue Standing HHA 2# marching,hip flex.hip abd 10 x LAQ 2lb 2x15 each  HS curls green 2x15 each Approximation to L digits Thera gun to upper trap followed by STW  10/30/23 Approximation to L digits Thera gun to upper trap NuStep L 5 x  6:28 min 500 steps  HS curls green 2x15 each LLE hip add green 2x10 LAQ 2lb 2x15 each  Standing march 2lb x10 Gait with HHA and w/c follow 100 feet, then 45 feet PATIENT EDUCATION:  Education details: POC Person educated: Patient Education method: Explanation Education comprehension: verbalized understanding  HOME EXERCISE PROGRAM: 10/11/23 went over with patient and caregiver regarding brace placement and what to look for as far as the padding moving, ankle pumps, tband HS curls  ASSESSMENT:  CLINICAL IMPRESSION: Patient doing well, She is still suffering from upper trap and shoulder pain. She has significant spasms and tenderness in the upper traps. STM really helped with the upper traps evident by  improved tissue elasticity. Session spent seated at mat table without back support promoting more core engagement. Tactile cue needed for an erect posture, pt tends to lean to her R. Pt reports a bruise on her L foot from her brace.   OBJECTIVE IMPAIRMENTS: Abnormal gait, cardiopulmonary status limiting activity, decreased activity tolerance, decreased balance, decreased coordination, decreased endurance, decreased mobility, difficulty walking, decreased ROM, decreased strength, increased edema, impaired flexibility, improper body mechanics, postural dysfunction, and pain.   REHAB POTENTIAL: Good  CLINICAL DECISION MAKING: Stable/uncomplicated  EVALUATION COMPLEXITY: Low   GOALS: Goals reviewed with patient? Yes  SHORT TERM GOALS: Target date: 06/02/23 Independent with advanced HEP Goal status: met 06/07/23  LONG TERM GOALS: Target date: 08/21/23  Independent with advanced HEP with caregiver Goal status: progressing 11/22/23  2.  Transfer with set up and CGA Goal status: progressing 11/15/23  3.  Walk HHA x 100 feet Goal status: met 11/22/23  4.  Increase left LE strength to 4/5 Goal status: Met 07/31/23  5.  Report neck and shoulder pain decreased 50% Goal status: Progressing 30% 11/15/23  PLAN:  PT FREQUENCY: 1-2x/week  PT DURATION: 12 weeks  PLANNED INTERVENTIONS: Therapeutic exercises, Therapeutic activity, Neuromuscular re-education, Balance training, Gait training, Patient/Family education, Self Care, Joint mobilization, Moist heat, Taping, and Manual therapy  PLAN FOR NEXT SESSION:  Continue for a little longer talk more with the caregiver about the walking and HEP   Grayce Sessions, PTA [facility] Outpatient Rehabilitation at [facility] [address] [facility]. [place], [place], [address] Phone: [phone]   Fax:  [phone]

## 2023-11-29 ENCOUNTER — Ambulatory Visit: Admitting: Physical Therapy

## 2023-11-29 ENCOUNTER — Encounter: Payer: Self-pay | Admitting: Physical Therapy

## 2023-11-29 DIAGNOSIS — R293 Abnormal posture: Secondary | ICD-10-CM | POA: Diagnosis not present

## 2023-11-29 DIAGNOSIS — M25512 Pain in left shoulder: Secondary | ICD-10-CM | POA: Diagnosis not present

## 2023-11-29 DIAGNOSIS — M542 Cervicalgia: Secondary | ICD-10-CM | POA: Diagnosis not present

## 2023-11-29 DIAGNOSIS — R262 Difficulty in walking, not elsewhere classified: Secondary | ICD-10-CM

## 2023-11-29 DIAGNOSIS — M6281 Muscle weakness (generalized): Secondary | ICD-10-CM

## 2023-11-29 DIAGNOSIS — M25572 Pain in left ankle and joints of left foot: Secondary | ICD-10-CM | POA: Diagnosis not present

## 2023-11-29 NOTE — Therapy (Signed)
 OUTPATIENT PHYSICAL THERAPY LOWER EXTREMITY TREATMENT       Patient Name: Karen Jackson MRN: [id number] DOB:09/05/1940, 83 y.o., female Today's Date: 11/29/2023  END OF SESSION:  PT End of Session - 11/29/23 1024     Visit Number 52    Date for PT Re-Evaluation 11/30/23    PT Start Time 1018    PT Stop Time 1100    PT Time Calculation (min) 42 min    Activity Tolerance Patient tolerated treatment well    Behavior During Therapy [facility] for tasks assessed/performed             Past Medical History:  Diagnosis Date   Acute cystitis without hematuria    Acute diastolic CHF (congestive heart failure) (HCC)    Arthritis    Dyspnea    Dysrhythmia    Fever of unknown origin 03/19/2017   Hyperlipidemia    Hypertension    denies at preop   Multifocal pneumonia    Neuromuscular disorder (HCC)    neuropathy left arm and foot   Osteopenia    Paralysis (HCC)    partial left side from CVA    Persistent atrial fibrillation (HCC)    PONV (postoperative nausea and vomiting)    Pre-diabetes    Stroke [facility]) 2013   hemmorahgic   Past Surgical History:  Procedure Laterality Date   ANKLE SURGERY     APPENDECTOMY     CHOLECYSTECTOMY     HARDWARE REMOVAL Left 03/29/2023   Procedure: HARDWARE REMOVAL;  Surgeon: Toni Arthurs, MD;  Location: [facility];  Service: Orthopedics;  Laterality: Left;   HERNIA REPAIR     Esophagus   INCISION AND DRAINAGE OF WOUND Left 03/29/2023   Procedure: LEFT ANKLE WOUND IRRIGATION AND DEBRIDEMENT WOUND;  Surgeon: Toni Arthurs, MD;  Location: [facility];  Service: Orthopedics;  Laterality: Left;   JOINT REPLACEMENT     total- right partial- left   MASTECTOMY PARTIAL / LUMPECTOMY  2012   left   ORIF ANKLE FRACTURE Left 07/20/2018   Procedure: OPEN REDUCTION INTERNAL FIXATION (ORIF) ANKLE FRACTURE;  Surgeon: Toni Arthurs, MD;  Location: [facility];  Service: Orthopedics;  Laterality: Left;   TOTAL KNEE ARTHROPLASTY Left 01/27/2019    Procedure: TOTAL KNEE ARTHROPLASTY;  Surgeon: Dannielle Huh, MD;  Location: [facility];  Service: Orthopedics;  Laterality: Left;   Patient Active Problem List   Diagnosis Date Noted   Goals of care, counseling/discussion 01/17/2022   Grief 01/17/2022   Secondary hypercoagulable state (HCC) 03/16/2021   Dizziness 03/10/2021   Orthostatic hypotension 10/15/2020   Presbycusis of both ears 03/08/2020   Mixed stress and urge urinary incontinence 12/02/2019   Macrocytosis 12/01/2019   Nutritional anemia 12/01/2019   S/P total knee replacement 01/27/2019   Recurrent left knee instability 07/05/2018   Respiratory failure with hypoxia (HCC) 08/30/2017   Hypoxemia    Heart failure with preserved ejection fraction (HCC), Grade 3 diastolic dysfunction 03/26/2017   PAF (paroxysmal atrial fibrillation) (HCC)    Dyspnea 03/19/2017   Encounter for preventive health examination 02/17/2016   Sensorineural hearing loss (SNHL), bilateral 01/26/2016   Hypomagnesemia 04/24/2014   Hemiparesis affecting left side as late effect of cerebrovascular accident (HCC) 04/24/2014   Nontraumatic cerebral hemorrhage (HCC) 04/30/2012   DM (diabetes mellitus) with complications (HCC) 03/04/2010   OSTEOPENIA 01/21/2009   UNSPECIFIED VITAMIN D DEFICIENCY 11/19/2007   HYPERCHOLESTEROLEMIA 10/25/2006   GASTROESOPHAGEAL REFLUX, NO ESOPHAGITIS 10/25/2006   DIVERTICULOSIS OF COLON 10/25/2006  Osteoarthritis 10/25/2006   CERVICAL SPINE DISORDER, NOS 10/25/2006    PCP: Waynard Edwards, MD  REFERRING PROVIDER: Waynard Edwards, MD  REFERRING DIAG: s/p left ankle ORIF hardware removal  THERAPY DIAG:  Muscle weakness (generalized)  Left shoulder pain, unspecified chronicity  Difficulty in walking, not elsewhere classified  Rationale for Evaluation and Treatment: Rehabilitation  ONSET DATE: 03/29/23  SUBJECTIVE:   SUBJECTIVE STATEMENT: Knees sore, shoulder is sore down into the elbows  PERTINENT HISTORY: CVA, CHF, HTN, A-fib,  TKA PAIN:  Are you having pain? Yes: NPRS scale: 7/10 Pain location: shoulder and knee  Pain description: ache sore Aggravating factors: neck and shoulder always hurt, knee and ankle hurt with transfers Relieving factors: massage, heat  PRECAUTIONS: None  RED FLAGS: None   WEIGHT BEARING RESTRICTIONS: No  FALLS:  Has patient fallen in last 6 months? No  LIVING ENVIRONMENT: Lives with: lives alone Lives in: House/apartment Stairs: No Has following equipment at home: Environmental consultant - 2 wheeled, Wheelchair (manual), shower chair, bed side commode, Grab bars, and Ramped entry  OCCUPATION: retired  PLOF: Needs assistance with homemaking, Needs assistance with transfers, and Leisure: plays bridge, she has 11 hours of an aide at home that helps with meals, going to MD's, dressing and bathing, is alone at night  PATIENT GOALS: walk, have less pain, transfer without difficulty  NEXT MD VISIT: none scheduled  OBJECTIVE:   DIAGNOSTIC FINDINGS: none performed  COGNITION: Overall cognitive status: Within functional limits for tasks assessed     SENSATION: WFL POSTURE: rounded shoulders and forward head  PALPATION: Left knee and ankle are swollen and tender, left upper and neck  LOWER EXTREMITY ROM:  Active ROM Right eval Left eval  Left 07/31/23 Left 08/16/23  Hip flexion       Hip extension       Hip abduction       Hip adduction       Hip internal rotation       Hip external rotation       Knee flexion  90 93 96 95  Knee extension  0 0 0 0  Ankle dorsiflexion  5     Ankle plantarflexion  30 44 49 52  Ankle inversion  5     Ankle eversion  0      (Blank rows = not tested)  LOWER EXTREMITY MMT:  MMT Right eval Left eval Left 06/21/23 Left 07/31/23 08/23/23 left 09/25/23 11/29/23 Left  Hip flexion 4- 3+ 4- 4 4- 4- 4+  Hip extension         Hip abduction 4 3+ 4- 4+ 4 4 4+  Hip adduction 4 3+ 4- 4 4- 4 4+  Hip internal rotation         Hip external rotation          Knee flexion 4- 3+ 4 4 4+ 4+ 4+  Knee extension 4- 3+ 4- 4 4 4 5   Ankle dorsiflexion     4- 4 4+  Ankle plantarflexion     4- 4 4+   Ankle inversion         Ankle eversion          (Blank rows = not tested)  FUNCTIONAL TESTS:  Transfers Max A, left knee gives out and goes into ER and tends to bow into varus, has not walked in about 18 months, and the last time I saw her we could walk about 60 feet with HHA and w/c following  GAIT: Distance walked:  unable   TODAY'S TREATMENT:                                                                                                                              DATE: 11/29/23 Nustep level 5 x 7:06 minutes 500 steps  Approximation of the fingers STM to the upper traps and the neck   Some with thera gun  Hip ad green 2x10 each HS curls green 2x15  Gait HHA with w/c follow x 70 feet  11/27/23 Nustep level 5 x 7 minutes Approximation of the fingers STM to the upper traps and the neck   Some with thera gun  On mat Table  Sit to stands x5 then 10 marches two rounds  HS curls red 2x10 AROM shoulder flex and abd limited orm Gait HHA with w/c follow x 50 feet x2  11/22/23 NuStep level 5 x 8 minutes Approximation of the fingers STM to the upper traps and the neck  Supine left leg abduction with sliding board Partial sit ups Trunk rotation Bridges Green tband HS curls Gait HHA with w/c follow x 140 feet  11/20/23 Nustep level 5 x 8 minutes Approximation of the left fingers STM to the left upper trap and neck Green tband HS curls Green tband left leg adduction Green tband ankle PF/DF 5# marches 5# LAQ Gait HHA with w/c follow 125 feet fatigue limited today  11/15/23 Nustep level 5 x 8 minutes Partial sit ups Volley ball working on trunk control and core strength Supine bridges and with sliding board left hip abduction Green tband HS curls 5# LAQ STM to the bilateral upper traps Gait with HHA and w/c follow 135 feet  11/13/23 With  the first transfer today her left knee went out to the side, poor control and c/o pain Nustep level 5 x 8 minutes Standing weight shifts STM to the upper traps and the neck area LEft leg 5# LAQ Red tband left hip abduction and adduction Red tband ankle PF/DF Green tband HS curls Gait with HHA and w/C follow 130 feet some difficulty with the left knee with soreness and a little more instability than normal  11/08/23 Nustep level 5 x 8 minutes STM to both upper traps and the cervical area Gentle PROM of the left shoulder with cues for her to relax Standing weight shifts 5# LAQ 5# marches Green tband HS curls Green tband PF/DF Gait with HHA and w/c follow 144 feet stopped due to fatigue  11/06/23 Nustep level 5 x 8 minutes Supine bridges 3x10 Supine left hip abduction with sliding board 3x10 Left hip adduction with tband in hooklying 3x10 Supine left hip extension with blue tband 3x10 Supine hooklying knees tied together trunk rotation Red tband ankle PF/DF 2x15 STM to the upper traps and the neck area Gait HHA with w/c follow x 140 feet  11/01/23 Nustep L 5 7 min Amb HHA 110 feet min A with w/c behind- had to  stop more SOB verse LE fatigue Standing HHA 2# marching,hip flex.hip abd 10 x LAQ 2lb 2x15 each  HS curls green 2x15 each Approximation to L digits Thera gun to upper trap followed by STW  10/30/23 Approximation to L digits Thera gun to upper trap NuStep L 5 x 6:28 min 500 steps  HS curls green 2x15 each LLE hip add green 2x10 LAQ 2lb 2x15 each  Standing march 2lb x10 Gait with HHA and w/c follow 100 feet, then 45 feet PATIENT EDUCATION:  Education details: POC Person educated: Patient Education method: Explanation Education comprehension: verbalized understanding  HOME EXERCISE PROGRAM: 10/11/23 went over with patient and caregiver regarding brace placement and what to look for as far as the padding moving, ankle pumps, tband HS curls  ASSESSMENT:  CLINICAL  IMPRESSION: Patient enters with reports of increase shoulder and L knee soreness.  Significant spasms and tenderness in the upper traps. STM really helped with the upper traps evident by improved tissue elasticity. She has improved her LLE strength some. Gait was limited due to increase R knee soreness.   OBJECTIVE IMPAIRMENTS: Abnormal gait, cardiopulmonary status limiting activity, decreased activity tolerance, decreased balance, decreased coordination, decreased endurance, decreased mobility, difficulty walking, decreased ROM, decreased strength, increased edema, impaired flexibility, improper body mechanics, postural dysfunction, and pain.   REHAB POTENTIAL: Good  CLINICAL DECISION MAKING: Stable/uncomplicated  EVALUATION COMPLEXITY: Low   GOALS: Goals reviewed with patient? Yes  SHORT TERM GOALS: Target date: 06/02/23 Independent with advanced HEP Goal status: met 06/07/23  LONG TERM GOALS: Target date: 08/21/23  Independent with advanced HEP with caregiver Goal status: progressing 11/22/23  2.  Transfer with set up and CGA Goal status: progressing 11/15/23, ongoing 11/29/23  3.  Walk HHA x 100 feet Goal status: met 11/22/23  4.  Increase left LE strength to 4/5 Goal status: Met 07/31/23  5.  Report neck and shoulder pain decreased 50% Goal status: Progressing 30% 11/15/23, ongoing 11/29/23  PLAN:  PT FREQUENCY: 1-2x/week  PT DURATION: 12 weeks  PLANNED INTERVENTIONS: Therapeutic exercises, Therapeutic activity, Neuromuscular re-education, Balance training, Gait training, Patient/Family education, Self Care, Joint mobilization, Moist heat, Taping, and Manual therapy  PLAN FOR NEXT SESSION:  Continue for a little longer talk more with the caregiver about the walking and HEP   Grayce Sessions, PTA [facility]

## 2023-12-04 ENCOUNTER — Encounter: Payer: Self-pay | Admitting: Physical Therapy

## 2023-12-04 ENCOUNTER — Ambulatory Visit: Admitting: Physical Therapy

## 2023-12-04 DIAGNOSIS — R293 Abnormal posture: Secondary | ICD-10-CM | POA: Diagnosis not present

## 2023-12-04 DIAGNOSIS — M6281 Muscle weakness (generalized): Secondary | ICD-10-CM | POA: Diagnosis not present

## 2023-12-04 DIAGNOSIS — M542 Cervicalgia: Secondary | ICD-10-CM | POA: Diagnosis not present

## 2023-12-04 DIAGNOSIS — M25572 Pain in left ankle and joints of left foot: Secondary | ICD-10-CM

## 2023-12-04 DIAGNOSIS — I6990 Unspecified sequelae of unspecified cerebrovascular disease: Secondary | ICD-10-CM

## 2023-12-04 DIAGNOSIS — R262 Difficulty in walking, not elsewhere classified: Secondary | ICD-10-CM | POA: Diagnosis not present

## 2023-12-04 DIAGNOSIS — M25512 Pain in left shoulder: Secondary | ICD-10-CM | POA: Diagnosis not present

## 2023-12-04 NOTE — Therapy (Signed)
 OUTPATIENT PHYSICAL THERAPY LOWER EXTREMITY TREATMENT       Patient Name: Karen Jackson MRN: [id number] DOB:September 28, 1940, 83 y.o., female Today's Date: 12/04/2023  END OF SESSION:  PT End of Session - 12/04/23 1019     Visit Number 53    Date for PT Re-Evaluation 11/30/23    PT Start Time 1015    PT Stop Time 1100    PT Time Calculation (min) 45 min    Activity Tolerance Patient tolerated treatment well    Behavior During Therapy [facility] for tasks assessed/performed             Past Medical History:  Diagnosis Date   Acute cystitis without hematuria    Acute diastolic CHF (congestive heart failure) (HCC)    Arthritis    Dyspnea    Dysrhythmia    Fever of unknown origin 03/19/2017   Hyperlipidemia    Hypertension    denies at preop   Multifocal pneumonia    Neuromuscular disorder (HCC)    neuropathy left arm and foot   Osteopenia    Paralysis (HCC)    partial left side from CVA    Persistent atrial fibrillation (HCC)    PONV (postoperative nausea and vomiting)    Pre-diabetes    Stroke [facility]) 2013   hemmorahgic   Past Surgical History:  Procedure Laterality Date   ANKLE SURGERY     APPENDECTOMY     CHOLECYSTECTOMY     HARDWARE REMOVAL Left 03/29/2023   Procedure: HARDWARE REMOVAL;  Surgeon: Toni Arthurs, MD;  Location: [facility];  Service: Orthopedics;  Laterality: Left;   HERNIA REPAIR     Esophagus   INCISION AND DRAINAGE OF WOUND Left 03/29/2023   Procedure: LEFT ANKLE WOUND IRRIGATION AND DEBRIDEMENT WOUND;  Surgeon: Toni Arthurs, MD;  Location: [facility];  Service: Orthopedics;  Laterality: Left;   JOINT REPLACEMENT     total- right partial- left   MASTECTOMY PARTIAL / LUMPECTOMY  2012   left   ORIF ANKLE FRACTURE Left 07/20/2018   Procedure: OPEN REDUCTION INTERNAL FIXATION (ORIF) ANKLE FRACTURE;  Surgeon: Toni Arthurs, MD;  Location: [facility];  Service: Orthopedics;  Laterality: Left;   TOTAL KNEE ARTHROPLASTY Left 01/27/2019    Procedure: TOTAL KNEE ARTHROPLASTY;  Surgeon: Dannielle Huh, MD;  Location: [facility];  Service: Orthopedics;  Laterality: Left;   Patient Active Problem List   Diagnosis Date Noted   Goals of care, counseling/discussion 01/17/2022   Grief 01/17/2022   Secondary hypercoagulable state (HCC) 03/16/2021   Dizziness 03/10/2021   Orthostatic hypotension 10/15/2020   Presbycusis of both ears 03/08/2020   Mixed stress and urge urinary incontinence 12/02/2019   Macrocytosis 12/01/2019   Nutritional anemia 12/01/2019   S/P total knee replacement 01/27/2019   Recurrent left knee instability 07/05/2018   Respiratory failure with hypoxia (HCC) 08/30/2017   Hypoxemia    Heart failure with preserved ejection fraction (HCC), Grade 3 diastolic dysfunction 03/26/2017   PAF (paroxysmal atrial fibrillation) (HCC)    Dyspnea 03/19/2017   Encounter for preventive health examination 02/17/2016   Sensorineural hearing loss (SNHL), bilateral 01/26/2016   Hypomagnesemia 04/24/2014   Hemiparesis affecting left side as late effect of cerebrovascular accident (HCC) 04/24/2014   Nontraumatic cerebral hemorrhage (HCC) 04/30/2012   DM (diabetes mellitus) with complications (HCC) 03/04/2010   OSTEOPENIA 01/21/2009   UNSPECIFIED VITAMIN D DEFICIENCY 11/19/2007   HYPERCHOLESTEROLEMIA 10/25/2006   GASTROESOPHAGEAL REFLUX, NO ESOPHAGITIS 10/25/2006   DIVERTICULOSIS OF COLON 10/25/2006  Osteoarthritis 10/25/2006   CERVICAL SPINE DISORDER, NOS 10/25/2006    PCP: Waynard Edwards, MD  REFERRING PROVIDER: Waynard Edwards, MD  REFERRING DIAG: s/p left ankle ORIF hardware removal  THERAPY DIAG:  Muscle weakness (generalized)  Left shoulder pain, unspecified chronicity  Difficulty in walking, not elsewhere classified  Pain in left ankle and joints of left foot  Cervicalgia  Late effects of CVA (cerebrovascular accident)  Rationale for Evaluation and Treatment: Rehabilitation  ONSET DATE: 03/29/23  SUBJECTIVE:   SUBJECTIVE  STATEMENT: Shoulders are not as sore, knee seems better, not dure why is was bad last week  PERTINENT HISTORY: CVA, CHF, HTN, A-fib, TKA PAIN:  Are you having pain? Yes: NPRS scale: 5/10 Pain location: shoulder and knee  Pain description: ache sore Aggravating factors: neck and shoulder always hurt, knee and ankle hurt with transfers Relieving factors: massage, heat  PRECAUTIONS: None  RED FLAGS: None   WEIGHT BEARING RESTRICTIONS: No  FALLS:  Has patient fallen in last 6 months? No  LIVING ENVIRONMENT: Lives with: lives alone Lives in: House/apartment Stairs: No Has following equipment at home: Environmental consultant - 2 wheeled, Wheelchair (manual), shower chair, bed side commode, Grab bars, and Ramped entry  OCCUPATION: retired  PLOF: Needs assistance with homemaking, Needs assistance with transfers, and Leisure: plays bridge, she has 11 hours of an aide at home that helps with meals, going to MD's, dressing and bathing, is alone at night  PATIENT GOALS: walk, have less pain, transfer without difficulty  NEXT MD VISIT: none scheduled  OBJECTIVE:   DIAGNOSTIC FINDINGS: none performed  COGNITION: Overall cognitive status: Within functional limits for tasks assessed     SENSATION: WFL POSTURE: rounded shoulders and forward head  PALPATION: Left knee and ankle are swollen and tender, left upper and neck  LOWER EXTREMITY ROM:  Active ROM Right eval Left eval  Left 07/31/23 Left 08/16/23  Hip flexion       Hip extension       Hip abduction       Hip adduction       Hip internal rotation       Hip external rotation       Knee flexion  90 93 96 95  Knee extension  0 0 0 0  Ankle dorsiflexion  5     Ankle plantarflexion  30 44 49 52  Ankle inversion  5     Ankle eversion  0      (Blank rows = not tested)  LOWER EXTREMITY MMT:  MMT Right eval Left eval Left 06/21/23 Left 07/31/23 08/23/23 left 09/25/23 11/29/23 Left  Hip flexion 4- 3+ 4- 4 4- 4- 4+  Hip extension          Hip abduction 4 3+ 4- 4+ 4 4 4+  Hip adduction 4 3+ 4- 4 4- 4 4+  Hip internal rotation         Hip external rotation         Knee flexion 4- 3+ 4 4 4+ 4+ 4+  Knee extension 4- 3+ 4- 4 4 4 5   Ankle dorsiflexion     4- 4 4+  Ankle plantarflexion     4- 4 4+   Ankle inversion         Ankle eversion          (Blank rows = not tested)  FUNCTIONAL TESTS:  Transfers Max A, left knee gives out and goes into ER and tends to bow into varus, has not walked  in about 18 months, and the last time I saw her we could walk about 60 feet with HHA and w/c following  GAIT: Distance walked: unable   TODAY'S TREATMENT:                                                                                                                              DATE: 12/04/23 Nustep level 5 x  6:56 minutes 500 steps  Approximation of the fingers STM to the upper traps and the neck   Some with thera gun  Hip ad green 2x10 each HS curls green 2x15  LAQ 5lb 2x15 Gait HHA with w/c follow x 110 feet   11/29/23 Nustep level 5 x 7:06 minutes 500 steps  Approximation of the fingers STM to the upper traps and the neck   Some with thera gun  Hip ad green 2x10 each HS curls green 2x15  Gait HHA with w/c follow x 70 feet  11/27/23 Nustep level 5 x 7 minutes Approximation of the fingers STM to the upper traps and the neck   Some with thera gun  On mat Table  Sit to stands x5 then 10 marches two rounds  HS curls red 2x10 AROM shoulder flex and abd limited orm Gait HHA with w/c follow x 50 feet x2  11/22/23 NuStep level 5 x 8 minutes Approximation of the fingers STM to the upper traps and the neck  Supine left leg abduction with sliding board Partial sit ups Trunk rotation Bridges Green tband HS curls Gait HHA with w/c follow x 140 feet  11/20/23 Nustep level 5 x 8 minutes Approximation of the left fingers STM to the left upper trap and neck Green tband HS curls Green tband left leg adduction Green tband ankle  PF/DF 5# marches 5# LAQ Gait HHA with w/c follow 125 feet fatigue limited today  11/15/23 Nustep level 5 x 8 minutes Partial sit ups Volley ball working on trunk control and core strength Supine bridges and with sliding board left hip abduction Green tband HS curls 5# LAQ STM to the bilateral upper traps Gait with HHA and w/c follow 135 feet  11/13/23 With the first transfer today her left knee went out to the side, poor control and c/o pain Nustep level 5 x 8 minutes Standing weight shifts STM to the upper traps and the neck area LEft leg 5# LAQ Red tband left hip abduction and adduction Red tband ankle PF/DF Green tband HS curls Gait with HHA and w/C follow 130 feet some difficulty with the left knee with soreness and a little more instability than normal  11/08/23 Nustep level 5 x 8 minutes STM to both upper traps and the cervical area Gentle PROM of the left shoulder with cues for her to relax Standing weight shifts 5# LAQ 5# marches Green tband HS curls Green tband PF/DF Gait with HHA and w/c follow 144 feet stopped due to fatigue  11/06/23 Nustep  level 5 x 8 minutes Supine bridges 3x10 Supine left hip abduction with sliding board 3x10 Left hip adduction with tband in hooklying 3x10 Supine left hip extension with blue tband 3x10 Supine hooklying knees tied together trunk rotation Red tband ankle PF/DF 2x15 STM to the upper traps and the neck area Gait HHA with w/c follow x 140 feet  11/01/23 Nustep L 5 7 min Amb HHA 110 feet min A with w/c behind- had to stop more SOB verse LE fatigue Standing HHA 2# marching,hip flex.hip abd 10 x LAQ 2lb 2x15 each  HS curls green 2x15 each Approximation to L digits Thera gun to upper trap followed by STW  10/30/23 Approximation to L digits Thera gun to upper trap NuStep L 5 x 6:28 min 500 steps  HS curls green 2x15 each LLE hip add green 2x10 LAQ 2lb 2x15 each  Standing march 2lb x10 Gait with HHA and w/c follow 100  feet, then 45 feet PATIENT EDUCATION:  Education details: POC Person educated: Patient Education method: Explanation Education comprehension: verbalized understanding  HOME EXERCISE PROGRAM: 10/11/23 went over with patient and caregiver regarding brace placement and what to look for as far as the padding moving, ankle pumps, tband HS curls  ASSESSMENT:  CLINICAL IMPRESSION: Patient enters with reports of improved  shoulder and L knee soreness.  Significant spasms and tenderness in the upper traps though. STM really helped with the upper traps evident by improved tissue elasticity. Verbal cues needed for full ROM w/ LAQ. She appears to be more safe with transfers not sitting prematurely. She did well today with improved gait velocity.   OBJECTIVE IMPAIRMENTS: Abnormal gait, cardiopulmonary status limiting activity, decreased activity tolerance, decreased balance, decreased coordination, decreased endurance, decreased mobility, difficulty walking, decreased ROM, decreased strength, increased edema, impaired flexibility, improper body mechanics, postural dysfunction, and pain.   REHAB POTENTIAL: Good  CLINICAL DECISION MAKING: Stable/uncomplicated  EVALUATION COMPLEXITY: Low   GOALS: Goals reviewed with patient? Yes  SHORT TERM GOALS: Target date: 06/02/23 Independent with advanced HEP Goal status: met 06/07/23  LONG TERM GOALS: Target date: 08/21/23  Independent with advanced HEP with caregiver Goal status: progressing 11/22/23  2.  Transfer with set up and CGA Goal status: progressing 11/15/23, ongoing 11/29/23  3.  Walk HHA x 100 feet Goal status: met 11/22/23  4.  Increase left LE strength to 4/5 Goal status: Met 07/31/23  5.  Report neck and shoulder pain decreased 50% Goal status: Progressing 30% 11/15/23, ongoing 11/29/23  PLAN:  PT FREQUENCY: 1-2x/week  PT DURATION: 12 weeks  PLANNED INTERVENTIONS: Therapeutic exercises, Therapeutic activity, Neuromuscular  re-education, Balance training, Gait training, Patient/Family education, Self Care, Joint mobilization, Moist heat, Taping, and Manual therapy  PLAN FOR NEXT SESSION:  Continue for a little longer talk more with the caregiver about the walking and HEP   Grayce Sessions, PTA [facility]

## 2023-12-06 ENCOUNTER — Ambulatory Visit: Admitting: Physical Therapy

## 2023-12-06 ENCOUNTER — Encounter: Payer: Self-pay | Admitting: Physical Therapy

## 2023-12-06 DIAGNOSIS — M25572 Pain in left ankle and joints of left foot: Secondary | ICD-10-CM | POA: Diagnosis not present

## 2023-12-06 DIAGNOSIS — R293 Abnormal posture: Secondary | ICD-10-CM | POA: Diagnosis not present

## 2023-12-06 DIAGNOSIS — R262 Difficulty in walking, not elsewhere classified: Secondary | ICD-10-CM

## 2023-12-06 DIAGNOSIS — M6281 Muscle weakness (generalized): Secondary | ICD-10-CM

## 2023-12-06 DIAGNOSIS — M542 Cervicalgia: Secondary | ICD-10-CM

## 2023-12-06 DIAGNOSIS — M25512 Pain in left shoulder: Secondary | ICD-10-CM | POA: Diagnosis not present

## 2023-12-06 DIAGNOSIS — I69354 Hemiplegia and hemiparesis following cerebral infarction affecting left non-dominant side: Secondary | ICD-10-CM

## 2023-12-06 DIAGNOSIS — I6990 Unspecified sequelae of unspecified cerebrovascular disease: Secondary | ICD-10-CM

## 2023-12-06 NOTE — Therapy (Signed)
 OUTPATIENT PHYSICAL THERAPY LOWER EXTREMITY TREATMENT       Patient Name: Karen Jackson MRN: [id number] DOB:12-Jan-1941, 83 y.o., female Today's Date: 12/06/2023  END OF SESSION:  PT End of Session - 12/06/23 1104     Visit Number 54    Date for PT Re-Evaluation 12/30/23    PT Start Time 1100    PT Stop Time 1145    PT Time Calculation (min) 45 min    Activity Tolerance Patient tolerated treatment well    Behavior During Therapy [facility] for tasks assessed/performed             Past Medical History:  Diagnosis Date   Acute cystitis without hematuria    Acute diastolic CHF (congestive heart failure) (HCC)    Arthritis    Dyspnea    Dysrhythmia    Fever of unknown origin 03/19/2017   Hyperlipidemia    Hypertension    denies at preop   Multifocal pneumonia    Neuromuscular disorder (HCC)    neuropathy left arm and foot   Osteopenia    Paralysis (HCC)    partial left side from CVA    Persistent atrial fibrillation (HCC)    PONV (postoperative nausea and vomiting)    Pre-diabetes    Stroke [facility]) 2013   hemmorahgic   Past Surgical History:  Procedure Laterality Date   ANKLE SURGERY     APPENDECTOMY     CHOLECYSTECTOMY     HARDWARE REMOVAL Left 03/29/2023   Procedure: HARDWARE REMOVAL;  Surgeon: Toni Arthurs, MD;  Location: [facility];  Service: Orthopedics;  Laterality: Left;   HERNIA REPAIR     Esophagus   INCISION AND DRAINAGE OF WOUND Left 03/29/2023   Procedure: LEFT ANKLE WOUND IRRIGATION AND DEBRIDEMENT WOUND;  Surgeon: Toni Arthurs, MD;  Location: [facility];  Service: Orthopedics;  Laterality: Left;   JOINT REPLACEMENT     total- right partial- left   MASTECTOMY PARTIAL / LUMPECTOMY  2012   left   ORIF ANKLE FRACTURE Left 07/20/2018   Procedure: OPEN REDUCTION INTERNAL FIXATION (ORIF) ANKLE FRACTURE;  Surgeon: Toni Arthurs, MD;  Location: [facility];  Service: Orthopedics;  Laterality: Left;   TOTAL KNEE ARTHROPLASTY Left 01/27/2019    Procedure: TOTAL KNEE ARTHROPLASTY;  Surgeon: Dannielle Huh, MD;  Location: [facility];  Service: Orthopedics;  Laterality: Left;   Patient Active Problem List   Diagnosis Date Noted   Goals of care, counseling/discussion 01/17/2022   Grief 01/17/2022   Secondary hypercoagulable state (HCC) 03/16/2021   Dizziness 03/10/2021   Orthostatic hypotension 10/15/2020   Presbycusis of both ears 03/08/2020   Mixed stress and urge urinary incontinence 12/02/2019   Macrocytosis 12/01/2019   Nutritional anemia 12/01/2019   S/P total knee replacement 01/27/2019   Recurrent left knee instability 07/05/2018   Respiratory failure with hypoxia (HCC) 08/30/2017   Hypoxemia    Heart failure with preserved ejection fraction (HCC), Grade 3 diastolic dysfunction 03/26/2017   PAF (paroxysmal atrial fibrillation) (HCC)    Dyspnea 03/19/2017   Encounter for preventive health examination 02/17/2016   Sensorineural hearing loss (SNHL), bilateral 01/26/2016   Hypomagnesemia 04/24/2014   Hemiparesis affecting left side as late effect of cerebrovascular accident (HCC) 04/24/2014   Nontraumatic cerebral hemorrhage (HCC) 04/30/2012   DM (diabetes mellitus) with complications (HCC) 03/04/2010   OSTEOPENIA 01/21/2009   UNSPECIFIED VITAMIN D DEFICIENCY 11/19/2007   HYPERCHOLESTEROLEMIA 10/25/2006   GASTROESOPHAGEAL REFLUX, NO ESOPHAGITIS 10/25/2006   DIVERTICULOSIS OF COLON 10/25/2006  Osteoarthritis 10/25/2006   CERVICAL SPINE DISORDER, NOS 10/25/2006    PCP: Waynard Edwards, MD  REFERRING PROVIDER: Waynard Edwards, MD  REFERRING DIAG: s/p left ankle ORIF hardware removal  THERAPY DIAG:  Muscle weakness (generalized)  Left shoulder pain, unspecified chronicity  Difficulty in walking, not elsewhere classified  Pain in left ankle and joints of left foot  Cervicalgia  Late effects of CVA (cerebrovascular accident)  Hemiplegia and hemiparesis following cerebral infarction affecting left non-dominant side  (HCC)  Rationale for Evaluation and Treatment: Rehabilitation  ONSET DATE: 03/29/23  SUBJECTIVE:   SUBJECTIVE STATEMENT: Doing okay, some foot pain, I ordered new shoes  PERTINENT HISTORY: CVA, CHF, HTN, A-fib, TKA PAIN:  Are you having pain? Yes: NPRS scale: 5/10 Pain location: shoulder and knee  Pain description: ache sore Aggravating factors: neck and shoulder always hurt, knee and ankle hurt with transfers Relieving factors: massage, heat  PRECAUTIONS: None  RED FLAGS: None   WEIGHT BEARING RESTRICTIONS: No  FALLS:  Has patient fallen in last 6 months? No  LIVING ENVIRONMENT: Lives with: lives alone Lives in: House/apartment Stairs: No Has following equipment at home: Environmental consultant - 2 wheeled, Wheelchair (manual), shower chair, bed side commode, Grab bars, and Ramped entry  OCCUPATION: retired  PLOF: Needs assistance with homemaking, Needs assistance with transfers, and Leisure: plays bridge, she has 11 hours of an aide at home that helps with meals, going to MD's, dressing and bathing, is alone at night  PATIENT GOALS: walk, have less pain, transfer without difficulty  NEXT MD VISIT: none scheduled  OBJECTIVE:   DIAGNOSTIC FINDINGS: none performed  COGNITION: Overall cognitive status: Within functional limits for tasks assessed     SENSATION: WFL POSTURE: rounded shoulders and forward head  PALPATION: Left knee and ankle are swollen and tender, left upper and neck  LOWER EXTREMITY ROM:  Active ROM Right eval Left eval  Left 07/31/23 Left 08/16/23  Hip flexion       Hip extension       Hip abduction       Hip adduction       Hip internal rotation       Hip external rotation       Knee flexion  90 93 96 95  Knee extension  0 0 0 0  Ankle dorsiflexion  5     Ankle plantarflexion  30 44 49 52  Ankle inversion  5     Ankle eversion  0      (Blank rows = not tested)  LOWER EXTREMITY MMT:  MMT Right eval Left eval Left 06/21/23 Left 07/31/23  08/23/23 left 09/25/23 11/29/23 Left  Hip flexion 4- 3+ 4- 4 4- 4- 4+  Hip extension         Hip abduction 4 3+ 4- 4+ 4 4 4+  Hip adduction 4 3+ 4- 4 4- 4 4+  Hip internal rotation         Hip external rotation         Knee flexion 4- 3+ 4 4 4+ 4+ 4+  Knee extension 4- 3+ 4- 4 4 4 5   Ankle dorsiflexion     4- 4 4+  Ankle plantarflexion     4- 4 4+   Ankle inversion         Ankle eversion          (Blank rows = not tested)  FUNCTIONAL TESTS:  Transfers Max A, left knee gives out and goes into ER and tends to bow  into varus, has not walked in about 18 months, and the last time I saw her we could walk about 60 feet with HHA and w/c following  GAIT: Distance walked: unable   TODAY'S TREATMENT:                                                                                                                              DATE: 12/06/23 Nustep level 5 x 8 minutes STM to the upper traps and the neck Supine Feet on ball K2C, rotation, bridges, isometric abs Left hip adduction with red tband Bridges SAQ 5# left leg Red tband left ankle Gait HHA with w/c follow 110 feet, rest and then 70 feet  12/04/23 Nustep level 5 x  6:56 minutes 500 steps  Approximation of the fingers STM to the upper traps and the neck   Some with thera gun  Hip ad green 2x10 each HS curls green 2x15  LAQ 5lb 2x15 Gait HHA with w/c follow x 110 feet   11/29/23 Nustep level 5 x 7:06 minutes 500 steps  Approximation of the fingers STM to the upper traps and the neck   Some with thera gun  Hip ad green 2x10 each HS curls green 2x15  Gait HHA with w/c follow x 70 feet  11/27/23 Nustep level 5 x 7 minutes Approximation of the fingers STM to the upper traps and the neck   Some with thera gun  On mat Table  Sit to stands x5 then 10 marches two rounds  HS curls red 2x10 AROM shoulder flex and abd limited orm Gait HHA with w/c follow x 50 feet x2  11/22/23 NuStep level 5 x 8 minutes Approximation of the  fingers STM to the upper traps and the neck  Supine left leg abduction with sliding board Partial sit ups Trunk rotation Bridges Green tband HS curls Gait HHA with w/c follow x 140 feet  11/20/23 Nustep level 5 x 8 minutes Approximation of the left fingers STM to the left upper trap and neck Green tband HS curls Green tband left leg adduction Green tband ankle PF/DF 5# marches 5# LAQ Gait HHA with w/c follow 125 feet fatigue limited today  11/15/23 Nustep level 5 x 8 minutes Partial sit ups Volley ball working on trunk control and core strength Supine bridges and with sliding board left hip abduction Green tband HS curls 5# LAQ STM to the bilateral upper traps Gait with HHA and w/c follow 135 feet  11/13/23 With the first transfer today her left knee went out to the side, poor control and c/o pain Nustep level 5 x 8 minutes Standing weight shifts STM to the upper traps and the neck area LEft leg 5# LAQ Red tband left hip abduction and adduction Red tband ankle PF/DF Green tband HS curls Gait with HHA and w/C follow 130 feet some difficulty with the left knee with soreness and a little more instability than normal  11/08/23 Nustep level 5 x 8 minutes STM to both upper traps and the cervical area Gentle PROM of the left shoulder with cues for her to relax Standing weight shifts 5# LAQ 5# marches Green tband HS curls Green tband PF/DF Gait with HHA and w/c follow 144 feet stopped due to fatigue PATIENT EDUCATION:  Education details: POC Person educated: Patient Education method: Explanation Education comprehension: verbalized understanding  HOME EXERCISE PROGRAM: 10/11/23 went over with patient and caregiver regarding brace placement and what to look for as far as the padding moving, ankle pumps, tband HS curls  ASSESSMENT:  CLINICAL IMPRESSION: Patient enters with reports of improved  shoulder and L knee soreness.  Went back to supine activities to mix it up  and stimulate mms and coordination.  I can better isolate the left hip for better mm contraction in this position.  Gait has been limited some extent due to breathing issues and some foot pain.   OBJECTIVE IMPAIRMENTS: Abnormal gait, cardiopulmonary status limiting activity, decreased activity tolerance, decreased balance, decreased coordination, decreased endurance, decreased mobility, difficulty walking, decreased ROM, decreased strength, increased edema, impaired flexibility, improper body mechanics, postural dysfunction, and pain.   REHAB POTENTIAL: Good  CLINICAL DECISION MAKING: Stable/uncomplicated  EVALUATION COMPLEXITY: Low   GOALS: Goals reviewed with patient? Yes  SHORT TERM GOALS: Target date: 06/02/23 Independent with advanced HEP Goal status: met 06/07/23  LONG TERM GOALS: Target date: 08/21/23  Independent with advanced HEP with caregiver Goal status: progressing 12/06/23  2.  Transfer with set up and CGA Goal status: progressing 11/15/23, ongoing 11/29/23  3.  Walk HHA x 100 feet Goal status: met 11/22/23  4.  Increase left LE strength to 4/5 Goal status: Met 07/31/23  5.  Report neck and shoulder pain decreased 50% Goal status: Progressing 30% 11/15/23, ongoing 11/29/23  PLAN:  PT FREQUENCY: 1-2x/week  PT DURATION: 12 weeks  PLANNED INTERVENTIONS: Therapeutic exercises, Therapeutic activity, Neuromuscular re-education, Balance training, Gait training, Patient/Family education, Self Care, Joint mobilization, Moist heat, Taping, and Manual therapy  PLAN FOR NEXT SESSION:  Continue for a little longer talk more with the caregiver about the walking and HEP   Jearld Lesch, PT [place]

## 2023-12-11 ENCOUNTER — Encounter: Payer: Self-pay | Admitting: Physical Therapy

## 2023-12-11 ENCOUNTER — Ambulatory Visit: Admitting: Physical Therapy

## 2023-12-11 DIAGNOSIS — R293 Abnormal posture: Secondary | ICD-10-CM | POA: Diagnosis not present

## 2023-12-11 DIAGNOSIS — M25572 Pain in left ankle and joints of left foot: Secondary | ICD-10-CM

## 2023-12-11 DIAGNOSIS — I6990 Unspecified sequelae of unspecified cerebrovascular disease: Secondary | ICD-10-CM

## 2023-12-11 DIAGNOSIS — R262 Difficulty in walking, not elsewhere classified: Secondary | ICD-10-CM

## 2023-12-11 DIAGNOSIS — M542 Cervicalgia: Secondary | ICD-10-CM

## 2023-12-11 DIAGNOSIS — M6281 Muscle weakness (generalized): Secondary | ICD-10-CM

## 2023-12-11 DIAGNOSIS — I69354 Hemiplegia and hemiparesis following cerebral infarction affecting left non-dominant side: Secondary | ICD-10-CM

## 2023-12-11 DIAGNOSIS — M25512 Pain in left shoulder: Secondary | ICD-10-CM

## 2023-12-11 NOTE — Therapy (Signed)
 OUTPATIENT PHYSICAL THERAPY LOWER EXTREMITY TREATMENT       Patient Name: Karen Jackson MRN: [id number] DOB:02-04-1941, 83 y.o., female Today's Date: 12/11/2023  END OF SESSION:  PT End of Session - 12/11/23 1138     Visit Number 55    Date for PT Re-Evaluation 12/30/23    PT Start Time 1056    PT Stop Time 1140    PT Time Calculation (min) 44 min    Activity Tolerance Patient tolerated treatment well    Behavior During Therapy [facility] for tasks assessed/performed             Past Medical History:  Diagnosis Date   Acute cystitis without hematuria    Acute diastolic CHF (congestive heart failure) (HCC)    Arthritis    Dyspnea    Dysrhythmia    Fever of unknown origin 03/19/2017   Hyperlipidemia    Hypertension    denies at preop   Multifocal pneumonia    Neuromuscular disorder (HCC)    neuropathy left arm and foot   Osteopenia    Paralysis (HCC)    partial left side from CVA    Persistent atrial fibrillation (HCC)    PONV (postoperative nausea and vomiting)    Pre-diabetes    Stroke [facility]) 2013   hemmorahgic   Past Surgical History:  Procedure Laterality Date   ANKLE SURGERY     APPENDECTOMY     CHOLECYSTECTOMY     HARDWARE REMOVAL Left 03/29/2023   Procedure: HARDWARE REMOVAL;  Surgeon: Toni Arthurs, MD;  Location: [facility];  Service: Orthopedics;  Laterality: Left;   HERNIA REPAIR     Esophagus   INCISION AND DRAINAGE OF WOUND Left 03/29/2023   Procedure: LEFT ANKLE WOUND IRRIGATION AND DEBRIDEMENT WOUND;  Surgeon: Toni Arthurs, MD;  Location: [facility];  Service: Orthopedics;  Laterality: Left;   JOINT REPLACEMENT     total- right partial- left   MASTECTOMY PARTIAL / LUMPECTOMY  2012   left   ORIF ANKLE FRACTURE Left 07/20/2018   Procedure: OPEN REDUCTION INTERNAL FIXATION (ORIF) ANKLE FRACTURE;  Surgeon: Toni Arthurs, MD;  Location: [facility];  Service: Orthopedics;  Laterality: Left;   TOTAL KNEE ARTHROPLASTY Left 01/27/2019    Procedure: TOTAL KNEE ARTHROPLASTY;  Surgeon: Dannielle Huh, MD;  Location: [facility];  Service: Orthopedics;  Laterality: Left;   Patient Active Problem List   Diagnosis Date Noted   Goals of care, counseling/discussion 01/17/2022   Grief 01/17/2022   Secondary hypercoagulable state (HCC) 03/16/2021   Dizziness 03/10/2021   Orthostatic hypotension 10/15/2020   Presbycusis of both ears 03/08/2020   Mixed stress and urge urinary incontinence 12/02/2019   Macrocytosis 12/01/2019   Nutritional anemia 12/01/2019   S/P total knee replacement 01/27/2019   Recurrent left knee instability 07/05/2018   Respiratory failure with hypoxia (HCC) 08/30/2017   Hypoxemia    Heart failure with preserved ejection fraction (HCC), Grade 3 diastolic dysfunction 03/26/2017   PAF (paroxysmal atrial fibrillation) (HCC)    Dyspnea 03/19/2017   Encounter for preventive health examination 02/17/2016   Sensorineural hearing loss (SNHL), bilateral 01/26/2016   Hypomagnesemia 04/24/2014   Hemiparesis affecting left side as late effect of cerebrovascular accident (HCC) 04/24/2014   Nontraumatic cerebral hemorrhage (HCC) 04/30/2012   DM (diabetes mellitus) with complications (HCC) 03/04/2010   OSTEOPENIA 01/21/2009   UNSPECIFIED VITAMIN D DEFICIENCY 11/19/2007   HYPERCHOLESTEROLEMIA 10/25/2006   GASTROESOPHAGEAL REFLUX, NO ESOPHAGITIS 10/25/2006   DIVERTICULOSIS OF COLON 10/25/2006  Osteoarthritis 10/25/2006   CERVICAL SPINE DISORDER, NOS 10/25/2006    PCP: Waynard Edwards, MD  REFERRING PROVIDER: Waynard Edwards, MD  REFERRING DIAG: s/p left ankle ORIF hardware removal  THERAPY DIAG:  Muscle weakness (generalized)  Left shoulder pain, unspecified chronicity  Difficulty in walking, not elsewhere classified  Pain in left ankle and joints of left foot  Cervicalgia  Late effects of CVA (cerebrovascular accident)  Hemiplegia and hemiparesis following cerebral infarction affecting left non-dominant side  (HCC)  Rationale for Evaluation and Treatment: Rehabilitation  ONSET DATE: 03/29/23  SUBJECTIVE:   SUBJECTIVE STATEMENT: Reports going tomorrow to get shoes, reports shoes not fitting right lately, she thinks she has some swelling in the feet  PERTINENT HISTORY: CVA, CHF, HTN, A-fib, TKA PAIN:  Are you having pain? Yes: NPRS scale: 5/10 Pain location: shoulder and knee  Pain description: ache sore Aggravating factors: neck and shoulder always hurt, knee and ankle hurt with transfers Relieving factors: massage, heat  PRECAUTIONS: None  RED FLAGS: None   WEIGHT BEARING RESTRICTIONS: No  FALLS:  Has patient fallen in last 6 months? No  LIVING ENVIRONMENT: Lives with: lives alone Lives in: House/apartment Stairs: No Has following equipment at home: Environmental consultant - 2 wheeled, Wheelchair (manual), shower chair, bed side commode, Grab bars, and Ramped entry  OCCUPATION: retired  PLOF: Needs assistance with homemaking, Needs assistance with transfers, and Leisure: plays bridge, she has 11 hours of an aide at home that helps with meals, going to MD's, dressing and bathing, is alone at night  PATIENT GOALS: walk, have less pain, transfer without difficulty  NEXT MD VISIT: none scheduled  OBJECTIVE:   DIAGNOSTIC FINDINGS: none performed  COGNITION: Overall cognitive status: Within functional limits for tasks assessed     SENSATION: WFL POSTURE: rounded shoulders and forward head  PALPATION: Left knee and ankle are swollen and tender, left upper and neck  LOWER EXTREMITY ROM:  Active ROM Right eval Left eval  Left 07/31/23 Left 08/16/23  Hip flexion       Hip extension       Hip abduction       Hip adduction       Hip internal rotation       Hip external rotation       Knee flexion  90 93 96 95  Knee extension  0 0 0 0  Ankle dorsiflexion  5     Ankle plantarflexion  30 44 49 52  Ankle inversion  5     Ankle eversion  0      (Blank rows = not tested)  LOWER  EXTREMITY MMT:  MMT Right eval Left eval Left 06/21/23 Left 07/31/23 08/23/23 left 09/25/23 11/29/23 Left  Hip flexion 4- 3+ 4- 4 4- 4- 4+  Hip extension         Hip abduction 4 3+ 4- 4+ 4 4 4+  Hip adduction 4 3+ 4- 4 4- 4 4+  Hip internal rotation         Hip external rotation         Knee flexion 4- 3+ 4 4 4+ 4+ 4+  Knee extension 4- 3+ 4- 4 4 4 5   Ankle dorsiflexion     4- 4 4+  Ankle plantarflexion     4- 4 4+   Ankle inversion         Ankle eversion          (Blank rows = not tested)  FUNCTIONAL TESTS:  Transfers Max A,  left knee gives out and goes into ER and tends to bow into varus, has not walked in about 18 months, and the last time I saw her we could walk about 60 feet with HHA and w/c following  GAIT: Distance walked: unable   TODAY'S TREATMENT:                                                                                                                              DATE: 12/11/23 STM to the upper traps, some finger approximation on the left for some movements Nustep level 5 x 7 minutes 5# LAQ 5# marches Green tband HS curls Standing weight shifts Partial sit ups Gait with HHA and w/c follow 135'  12/06/23 Nustep level 5 x 8 minutes STM to the upper traps and the neck Supine Feet on ball K2C, rotation, bridges, isometric abs Left hip adduction with red tband Bridges SAQ 5# left leg Red tband left ankle Gait HHA with w/c follow 110 feet, rest and then 70 feet  12/04/23 Nustep level 5 x  6:56 minutes 500 steps  Approximation of the fingers STM to the upper traps and the neck   Some with thera gun  Hip ad green 2x10 each HS curls green 2x15  LAQ 5lb 2x15 Gait HHA with w/c follow x 110 feet   11/29/23 Nustep level 5 x 7:06 minutes 500 steps  Approximation of the fingers STM to the upper traps and the neck   Some with thera gun  Hip ad green 2x10 each HS curls green 2x15  Gait HHA with w/c follow x 70 feet  11/27/23 Nustep level 5 x 7  minutes Approximation of the fingers STM to the upper traps and the neck   Some with thera gun  On mat Table  Sit to stands x5 then 10 marches two rounds  HS curls red 2x10 AROM shoulder flex and abd limited orm Gait HHA with w/c follow x 50 feet x2  11/22/23 NuStep level 5 x 8 minutes Approximation of the fingers STM to the upper traps and the neck  Supine left leg abduction with sliding board Partial sit ups Trunk rotation Bridges Green tband HS curls Gait HHA with w/c follow x 140 feet  11/20/23 Nustep level 5 x 8 minutes Approximation of the left fingers STM to the left upper trap and neck Green tband HS curls Green tband left leg adduction Green tband ankle PF/DF 5# marches 5# LAQ Gait HHA with w/c follow 125 feet fatigue limited today  11/15/23 Nustep level 5 x 8 minutes Partial sit ups Volley ball working on trunk control and core strength Supine bridges and with sliding board left hip abduction Green tband HS curls 5# LAQ STM to the bilateral upper traps Gait with HHA and w/c follow 135 feet  11/13/23 With the first transfer today her left knee went out to the side, poor control and c/o pain Nustep level 5 x 8 minutes  Standing weight shifts STM to the upper traps and the neck area LEft leg 5# LAQ Red tband left hip abduction and adduction Red tband ankle PF/DF Green tband HS curls Gait with HHA and w/C follow 130 feet some difficulty with the left knee with soreness and a little more instability than normal  11/08/23 Nustep level 5 x 8 minutes STM to both upper traps and the cervical area Gentle PROM of the left shoulder with cues for her to relax Standing weight shifts 5# LAQ 5# marches Green tband HS curls Green tband PF/DF Gait with HHA and w/c follow 144 feet stopped due to fatigue PATIENT EDUCATION:  Education details: POC Person educated: Patient Education method: Explanation Education comprehension: verbalized understanding  HOME EXERCISE  PROGRAM: 10/11/23 went over with patient and caregiver regarding brace placement and what to look for as far as the padding moving, ankle pumps, tband HS curls  ASSESSMENT:  CLINICAL IMPRESSION: Was able to go 135 feet today with her best recently being 140 feet, less pain mostly limited now due to fatigue and shortness of breath.  She is reporting some feet issues and is looking to get new shoes tomorrow, asked her to have care giver look at her bare feet often to assure no blisters as she has had healing issues in the recent past   OBJECTIVE IMPAIRMENTS: Abnormal gait, cardiopulmonary status limiting activity, decreased activity tolerance, decreased balance, decreased coordination, decreased endurance, decreased mobility, difficulty walking, decreased ROM, decreased strength, increased edema, impaired flexibility, improper body mechanics, postural dysfunction, and pain.   REHAB POTENTIAL: Good  CLINICAL DECISION MAKING: Stable/uncomplicated  EVALUATION COMPLEXITY: Low   GOALS: Goals reviewed with patient? Yes  SHORT TERM GOALS: Target date: 06/02/23 Independent with advanced HEP Goal status: met 06/07/23  LONG TERM GOALS: Target date: 08/21/23  Independent with advanced HEP with caregiver Goal status: progressing 4/115/25  2.  Transfer with set up and CGA Goal status: progressing 11/15/23, ongoing 12/11/23  3.  Walk HHA x 100 feet Goal status: met 11/22/23  4.  Increase left LE strength to 4/5 Goal status: Met 07/31/23  5.  Report neck and shoulder pain decreased 50% Goal status: Progressing 30% 11/15/23, ongoing 12/11/23  PLAN:  PT FREQUENCY: 1-2x/week  PT DURATION: 12 weeks  PLANNED INTERVENTIONS: Therapeutic exercises, Therapeutic activity, Neuromuscular re-education, Balance training, Gait training, Patient/Family education, Self Care, Joint mobilization, Moist heat, Taping, and Manual therapy  PLAN FOR NEXT SESSION:  Continue for a little longer talk more with the  caregiver about the walking and HEP   Hollis Lurie, PT [place]

## 2023-12-13 ENCOUNTER — Encounter: Payer: Self-pay | Admitting: Physical Therapy

## 2023-12-13 ENCOUNTER — Ambulatory Visit: Admitting: Physical Therapy

## 2023-12-13 DIAGNOSIS — M6281 Muscle weakness (generalized): Secondary | ICD-10-CM

## 2023-12-13 DIAGNOSIS — R293 Abnormal posture: Secondary | ICD-10-CM | POA: Diagnosis not present

## 2023-12-13 DIAGNOSIS — M25512 Pain in left shoulder: Secondary | ICD-10-CM

## 2023-12-13 DIAGNOSIS — R262 Difficulty in walking, not elsewhere classified: Secondary | ICD-10-CM

## 2023-12-13 DIAGNOSIS — M25572 Pain in left ankle and joints of left foot: Secondary | ICD-10-CM | POA: Diagnosis not present

## 2023-12-13 DIAGNOSIS — M542 Cervicalgia: Secondary | ICD-10-CM | POA: Diagnosis not present

## 2023-12-13 NOTE — Therapy (Signed)
 OUTPATIENT PHYSICAL THERAPY LOWER EXTREMITY TREATMENT       Patient Name: Karen Jackson MRN: [id number] DOB:1941/06/02, 83 y.o., female Today's Date: 12/13/2023  END OF SESSION:  PT End of Session - 12/13/23 1026     Visit Number 56    Date for PT Re-Evaluation 12/30/23    PT Start Time 1015    PT Stop Time 1100    PT Time Calculation (min) 45 min    Activity Tolerance Patient tolerated treatment well    Behavior During Therapy [facility] for tasks assessed/performed             Past Medical History:  Diagnosis Date   Acute cystitis without hematuria    Acute diastolic CHF (congestive heart failure) (HCC)    Arthritis    Dyspnea    Dysrhythmia    Fever of unknown origin 03/19/2017   Hyperlipidemia    Hypertension    denies at preop   Multifocal pneumonia    Neuromuscular disorder (HCC)    neuropathy left arm and foot   Osteopenia    Paralysis (HCC)    partial left side from CVA    Persistent atrial fibrillation (HCC)    PONV (postoperative nausea and vomiting)    Pre-diabetes    Stroke [facility]) 2013   hemmorahgic   Past Surgical History:  Procedure Laterality Date   ANKLE SURGERY     APPENDECTOMY     CHOLECYSTECTOMY     HARDWARE REMOVAL Left 03/29/2023   Procedure: HARDWARE REMOVAL;  Surgeon: Toni Arthurs, MD;  Location: [facility];  Service: Orthopedics;  Laterality: Left;   HERNIA REPAIR     Esophagus   INCISION AND DRAINAGE OF WOUND Left 03/29/2023   Procedure: LEFT ANKLE WOUND IRRIGATION AND DEBRIDEMENT WOUND;  Surgeon: Toni Arthurs, MD;  Location: [facility];  Service: Orthopedics;  Laterality: Left;   JOINT REPLACEMENT     total- right partial- left   MASTECTOMY PARTIAL / LUMPECTOMY  2012   left   ORIF ANKLE FRACTURE Left 07/20/2018   Procedure: OPEN REDUCTION INTERNAL FIXATION (ORIF) ANKLE FRACTURE;  Surgeon: Toni Arthurs, MD;  Location[facility];  Service: Orthopedics;  Laterality: Left;   TOTAL KNEE ARTHROPLASTY Left 01/27/2019    Procedure: TOTAL KNEE ARTHROPLASTY;  Surgeon: Dannielle Huh, MD;  Location: [facility];  Service: Orthopedics;  Laterality: Left;   Patient Active Problem List   Diagnosis Date Noted   Goals of care, counseling/discussion 01/17/2022   Grief 01/17/2022   Secondary hypercoagulable state (HCC) 03/16/2021   Dizziness 03/10/2021   Orthostatic hypotension 10/15/2020   Presbycusis of both ears 03/08/2020   Mixed stress and urge urinary incontinence 12/02/2019   Macrocytosis 12/01/2019   Nutritional anemia 12/01/2019   S/P total knee replacement 01/27/2019   Recurrent left knee instability 07/05/2018   Respiratory failure with hypoxia (HCC) 08/30/2017   Hypoxemia    Heart failure with preserved ejection fraction (HCC), Grade 3 diastolic dysfunction 03/26/2017   PAF (paroxysmal atrial fibrillation) (HCC)    Dyspnea 03/19/2017   Encounter for preventive health examination 02/17/2016   Sensorineural hearing loss (SNHL), bilateral 01/26/2016   Hypomagnesemia 04/24/2014   Hemiparesis affecting left side as late effect of cerebrovascular accident (HCC) 04/24/2014   Nontraumatic cerebral hemorrhage (HCC) 04/30/2012   DM (diabetes mellitus) with complications (HCC) 03/04/2010   OSTEOPENIA 01/21/2009   UNSPECIFIED VITAMIN D DEFICIENCY 11/19/2007   HYPERCHOLESTEROLEMIA 10/25/2006   GASTROESOPHAGEAL REFLUX, NO ESOPHAGITIS 10/25/2006   DIVERTICULOSIS OF COLON 10/25/2006  Osteoarthritis 10/25/2006   CERVICAL SPINE DISORDER, NOS 10/25/2006    PCP: Waynard Edwards, MD  REFERRING PROVIDER: Waynard Edwards, MD  REFERRING DIAG: s/p left ankle ORIF hardware removal  THERAPY DIAG:  Muscle weakness (generalized)  Difficulty in walking, not elsewhere classified  Left shoulder pain, unspecified chronicity  Rationale for Evaluation and Treatment: Rehabilitation  ONSET DATE: 03/29/23  SUBJECTIVE:   SUBJECTIVE STATEMENT: Did not get new shoes, pain in the shoulder and hand  PERTINENT HISTORY: CVA, CHF, HTN, A-fib,  TKA PAIN:  Are you having pain? Yes: NPRS scale: 6/10 Pain location: shoulder and knee  Pain description: ache sore Aggravating factors: neck and shoulder always hurt, knee and ankle hurt with transfers Relieving factors: massage, heat  PRECAUTIONS: None  RED FLAGS: None   WEIGHT BEARING RESTRICTIONS: No  FALLS:  Has patient fallen in last 6 months? No  LIVING ENVIRONMENT: Lives with: lives alone Lives in: House/apartment Stairs: No Has following equipment at home: Environmental consultant - 2 wheeled, Wheelchair (manual), shower chair, bed side commode, Grab bars, and Ramped entry  OCCUPATION: retired  PLOF: Needs assistance with homemaking, Needs assistance with transfers, and Leisure: plays bridge, she has 11 hours of an aide at home that helps with meals, going to MD's, dressing and bathing, is alone at night  PATIENT GOALS: walk, have less pain, transfer without difficulty  NEXT MD VISIT: none scheduled  OBJECTIVE:   DIAGNOSTIC FINDINGS: none performed  COGNITION: Overall cognitive status: Within functional limits for tasks assessed     SENSATION: WFL POSTURE: rounded shoulders and forward head  PALPATION: Left knee and ankle are swollen and tender, left upper and neck  LOWER EXTREMITY ROM:  Active ROM Right eval Left eval  Left 07/31/23 Left 08/16/23  Hip flexion       Hip extension       Hip abduction       Hip adduction       Hip internal rotation       Hip external rotation       Knee flexion  90 93 96 95  Knee extension  0 0 0 0  Ankle dorsiflexion  5     Ankle plantarflexion  30 44 49 52  Ankle inversion  5     Ankle eversion  0      (Blank rows = not tested)  LOWER EXTREMITY MMT:  MMT Right eval Left eval Left 06/21/23 Left 07/31/23 08/23/23 left 09/25/23 11/29/23 Left  Hip flexion 4- 3+ 4- 4 4- 4- 4+  Hip extension         Hip abduction 4 3+ 4- 4+ 4 4 4+  Hip adduction 4 3+ 4- 4 4- 4 4+  Hip internal rotation         Hip external rotation          Knee flexion 4- 3+ 4 4 4+ 4+ 4+  Knee extension 4- 3+ 4- 4 4 4 5   Ankle dorsiflexion     4- 4 4+  Ankle plantarflexion     4- 4 4+   Ankle inversion         Ankle eversion          (Blank rows = not tested)  FUNCTIONAL TESTS:  Transfers Max A, left knee gives out and goes into ER and tends to bow into varus, has not walked in about 18 months, and the last time I saw her we could walk about 60 feet with HHA and w/c following  GAIT:  Distance walked: unable   TODAY'S TREATMENT:                                                                                                                              DATE: 12/13/23 STM to the upper traps, some finger approximation on the left for some movements Nustep level 5 x 6:38 minutes 500 steps  5# LAQ Green tband HS curls Standing march HHA x2 Gait with HHA and w/c follow 39'  12/11/23 STM to the upper traps, some finger approximation on the left for some movements Nustep level 5 x 7 minutes 5# LAQ 5# marches Green tband HS curls Standing weight shifts Partial sit ups Gait with HHA and w/c follow 135'  12/06/23 Nustep level 5 x 8 minutes STM to the upper traps and the neck Supine Feet on ball K2C, rotation, bridges, isometric abs Left hip adduction with red tband Bridges SAQ 5# left leg Red tband left ankle Gait HHA with w/c follow 110 feet, rest and then 70 feet  12/04/23 Nustep level 5 x  6:56 minutes 500 steps  Approximation of the fingers STM to the upper traps and the neck   Some with thera gun  Hip ad green 2x10 each HS curls green 2x15  LAQ 5lb 2x15 Gait HHA with w/c follow x 110 feet   11/29/23 Nustep level 5 x 7:06 minutes 500 steps  Approximation of the fingers STM to the upper traps and the neck   Some with thera gun  Hip ad green 2x10 each HS curls green 2x15  Gait HHA with w/c follow x 70 feet  11/27/23 Nustep level 5 x 7 minutes Approximation of the fingers STM to the upper traps and the neck   Some with  thera gun  On mat Table  Sit to stands x5 then 10 marches two rounds  HS curls red 2x10 AROM shoulder flex and abd limited orm Gait HHA with w/c follow x 50 feet x2  11/22/23 NuStep level 5 x 8 minutes Approximation of the fingers STM to the upper traps and the neck  Supine left leg abduction with sliding board Partial sit ups Trunk rotation Bridges Green tband HS curls Gait HHA with w/c follow x 140 feet  11/20/23 Nustep level 5 x 8 minutes Approximation of the left fingers STM to the left upper trap and neck Green tband HS curls Green tband left leg adduction Green tband ankle PF/DF 5# marches 5# LAQ Gait HHA with w/c follow 125 feet fatigue limited today  11/15/23 Nustep level 5 x 8 minutes Partial sit ups Volley ball working on trunk control and core strength Supine bridges and with sliding board left hip abduction Green tband HS curls 5# LAQ STM to the bilateral upper traps Gait with HHA and w/c follow 135 feet  11/13/23 With the first transfer today her left knee went out to the side, poor control and c/o pain Nustep level 5 x 8  minutes Standing weight shifts STM to the upper traps and the neck area LEft leg 5# LAQ Red tband left hip abduction and adduction Red tband ankle PF/DF Green tband HS curls Gait with HHA and w/C follow 130 feet some difficulty with the left knee with soreness and a little more instability than normal  11/08/23 Nustep level 5 x 8 minutes STM to both upper traps and the cervical area Gentle PROM of the left shoulder with cues for her to relax Standing weight shifts 5# LAQ 5# marches Green tband HS curls Green tband PF/DF Gait with HHA and w/c follow 144 feet stopped due to fatigue PATIENT EDUCATION:  Education details: POC Person educated: Patient Education method: Explanation Education comprehension: verbalized understanding  HOME EXERCISE PROGRAM: 10/11/23 went over with patient and caregiver regarding brace placement and  what to look for as far as the padding moving, ankle pumps, tband HS curls  ASSESSMENT:  CLINICAL IMPRESSION: Pt unable to get new she's because merchant was unable to put them on for her. She enters with continues shoulder and hand pain. Improved tissue elasticity noted after MT. Cue for full ROM needed with HS curls and LAQ. Increase fatigue with gait, max encouragement given when ambulating to keep going.    OBJECTIVE IMPAIRMENTS: Abnormal gait, cardiopulmonary status limiting activity, decreased activity tolerance, decreased balance, decreased coordination, decreased endurance, decreased mobility, difficulty walking, decreased ROM, decreased strength, increased edema, impaired flexibility, improper body mechanics, postural dysfunction, and pain.   REHAB POTENTIAL: Good  CLINICAL DECISION MAKING: Stable/uncomplicated  EVALUATION COMPLEXITY: Low   GOALS: Goals reviewed with patient? Yes  SHORT TERM GOALS: Target date: 06/02/23 Independent with advanced HEP Goal status: met 06/07/23  LONG TERM GOALS: Target date: 08/21/23  Independent with advanced HEP with caregiver Goal status: progressing 4/115/25  2.  Transfer with set up and CGA Goal status: progressing 11/15/23, ongoing 12/11/23  3.  Walk HHA x 100 feet Goal status: met 11/22/23  4.  Increase left LE strength to 4/5 Goal status: Met 07/31/23  5.  Report neck and shoulder pain decreased 50% Goal status: Progressing 30% 11/15/23, ongoing 12/11/23  PLAN:  PT FREQUENCY: 1-2x/week  PT DURATION: 12 weeks  PLANNED INTERVENTIONS: Therapeutic exercises, Therapeutic activity, Neuromuscular re-education, Balance training, Gait training, Patient/Family education, Self Care, Joint mobilization, Moist heat, Taping, and Manual therapy  PLAN FOR NEXT SESSION:  Continue for a little longer talk more with the caregiver about the walking and HEP   Ollen Beverage, PTA [facility]

## 2023-12-18 ENCOUNTER — Ambulatory Visit: Admitting: Physical Therapy

## 2023-12-18 DIAGNOSIS — M542 Cervicalgia: Secondary | ICD-10-CM | POA: Diagnosis not present

## 2023-12-18 DIAGNOSIS — R293 Abnormal posture: Secondary | ICD-10-CM | POA: Diagnosis not present

## 2023-12-18 DIAGNOSIS — I69354 Hemiplegia and hemiparesis following cerebral infarction affecting left non-dominant side: Secondary | ICD-10-CM | POA: Diagnosis not present

## 2023-12-18 DIAGNOSIS — M25572 Pain in left ankle and joints of left foot: Secondary | ICD-10-CM | POA: Diagnosis not present

## 2023-12-18 DIAGNOSIS — R29898 Other symptoms and signs involving the musculoskeletal system: Secondary | ICD-10-CM | POA: Diagnosis not present

## 2023-12-18 DIAGNOSIS — R262 Difficulty in walking, not elsewhere classified: Secondary | ICD-10-CM

## 2023-12-18 DIAGNOSIS — M79674 Pain in right toe(s): Secondary | ICD-10-CM | POA: Diagnosis not present

## 2023-12-18 DIAGNOSIS — M6281 Muscle weakness (generalized): Secondary | ICD-10-CM

## 2023-12-18 DIAGNOSIS — L89523 Pressure ulcer of left ankle, stage 3: Secondary | ICD-10-CM | POA: Diagnosis not present

## 2023-12-18 DIAGNOSIS — M25512 Pain in left shoulder: Secondary | ICD-10-CM

## 2023-12-18 NOTE — Therapy (Signed)
 OUTPATIENT PHYSICAL THERAPY LOWER EXTREMITY TREATMENT       Patient Name: Karen Jackson MRN: [id number] DOB:1941/04/28, 83 y.o., female Today's Date: 12/18/2023  END OF SESSION:  PT End of Session - 12/18/23 1058     Visit Number 57    Date for PT Re-Evaluation 12/30/23    Authorization Type --    PT Start Time 1058    PT Stop Time 1144    PT Time Calculation (min) 46 min             Past Medical History:  Diagnosis Date   Acute cystitis without hematuria    Acute diastolic CHF (congestive heart failure) (HCC)    Arthritis    Dyspnea    Dysrhythmia    Fever of unknown origin 03/19/2017   Hyperlipidemia    Hypertension    denies at preop   Multifocal pneumonia    Neuromuscular disorder (HCC)    neuropathy left arm and foot   Osteopenia    Paralysis (HCC)    partial left side from CVA    Persistent atrial fibrillation (HCC)    PONV (postoperative nausea and vomiting)    Pre-diabetes    Stroke [facility]) 2013   hemmorahgic   Past Surgical History:  Procedure Laterality Date   ANKLE SURGERY     APPENDECTOMY     CHOLECYSTECTOMY     HARDWARE REMOVAL Left 03/29/2023   Procedure: HARDWARE REMOVAL;  Surgeon: Amada Backer, MD;  Location: [facility];  Service: Orthopedics;  Laterality: Left;   HERNIA REPAIR     Esophagus   INCISION AND DRAINAGE OF WOUND Left 03/29/2023   Procedure: LEFT ANKLE WOUND IRRIGATION AND DEBRIDEMENT WOUND;  Surgeon: Amada Backer, MD;  Location: [facility];  Service: Orthopedics;  Laterality: Left;   JOINT REPLACEMENT     total- right partial- left   MASTECTOMY PARTIAL / LUMPECTOMY  2012   left   ORIF ANKLE FRACTURE Left 07/20/2018   Procedure: OPEN REDUCTION INTERNAL FIXATION (ORIF) ANKLE FRACTURE;  Surgeon: Amada Backer, MD;  Location: [facility];  Service: Orthopedics;  Laterality: Left;   TOTAL KNEE ARTHROPLASTY Left 01/27/2019   Procedure: TOTAL KNEE ARTHROPLASTY;  Surgeon: Christie Cox, MD;  Location: [facility];  Service:  Orthopedics;  Laterality: Left;   Patient Active Problem List   Diagnosis Date Noted   Goals of care, counseling/discussion 01/17/2022   Grief 01/17/2022   Secondary hypercoagulable state (HCC) 03/16/2021   Dizziness 03/10/2021   Orthostatic hypotension 10/15/2020   Presbycusis of both ears 03/08/2020   Mixed stress and urge urinary incontinence 12/02/2019   Macrocytosis 12/01/2019   Nutritional anemia 12/01/2019   S/P total knee replacement 01/27/2019   Recurrent left knee instability 07/05/2018   Respiratory failure with hypoxia (HCC) 08/30/2017   Hypoxemia    Heart failure with preserved ejection fraction (HCC), Grade 3 diastolic dysfunction 03/26/2017   PAF (paroxysmal atrial fibrillation) (HCC)    Dyspnea 03/19/2017   Encounter for preventive health examination 02/17/2016   Sensorineural hearing loss (SNHL), bilateral 01/26/2016   Hypomagnesemia 04/24/2014   Hemiparesis affecting left side as late effect of cerebrovascular accident (HCC) 04/24/2014   Nontraumatic cerebral hemorrhage (HCC) 04/30/2012   DM (diabetes mellitus) with complications (HCC) 03/04/2010   OSTEOPENIA 01/21/2009   UNSPECIFIED VITAMIN D  DEFICIENCY 11/19/2007   HYPERCHOLESTEROLEMIA 10/25/2006   GASTROESOPHAGEAL REFLUX, NO ESOPHAGITIS 10/25/2006   DIVERTICULOSIS OF COLON 10/25/2006   Osteoarthritis 10/25/2006   CERVICAL SPINE DISORDER, NOS 10/25/2006  PCP: Genelle Kennedy, MD  REFERRING PROVIDER: Genelle Kennedy, MD  REFERRING DIAG: s/p left ankle ORIF hardware removal  THERAPY DIAG:  Muscle weakness (generalized)  Difficulty in walking, not elsewhere classified  Left shoulder pain, unspecified chronicity  Pain in left ankle and joints of left foot  Rationale for Evaluation and Treatment: Rehabilitation  ONSET DATE: 03/29/23  SUBJECTIVE:   SUBJECTIVE STATEMENT: Trigger toe,seeing MD this afternoon  PERTINENT HISTORY: CVA, CHF, HTN, A-fib, TKA PAIN:  Are you having pain? Yes: NPRS scale: 6/10 Pain  location: shoulder and knee  Pain description: ache sore Aggravating factors: neck and shoulder always hurt, knee and ankle hurt with transfers Relieving factors: massage, heat  PRECAUTIONS: None  RED FLAGS: None   WEIGHT BEARING RESTRICTIONS: No  FALLS:  Has patient fallen in last 6 months? No  LIVING ENVIRONMENT: Lives with: lives alone Lives in: House/apartment Stairs: No Has following equipment at home: Environmental consultant - 2 wheeled, Wheelchair (manual), shower chair, bed side commode, Grab bars, and Ramped entry  OCCUPATION: retired  PLOF: Needs assistance with homemaking, Needs assistance with transfers, and Leisure: plays bridge, she has 11 hours of an aide at home that helps with meals, going to MD's, dressing and bathing, is alone at night  PATIENT GOALS: walk, have less pain, transfer without difficulty  NEXT MD VISIT: none scheduled  OBJECTIVE:   DIAGNOSTIC FINDINGS: none performed  COGNITION: Overall cognitive status: Within functional limits for tasks assessed     SENSATION: WFL POSTURE: rounded shoulders and forward head  PALPATION: Left knee and ankle are swollen and tender, left upper and neck  LOWER EXTREMITY ROM:  Active ROM Right eval Left eval  Left 07/31/23 Left 08/16/23  Hip flexion       Hip extension       Hip abduction       Hip adduction       Hip internal rotation       Hip external rotation       Knee flexion  90 93 96 95  Knee extension  0 0 0 0  Ankle dorsiflexion  5     Ankle plantarflexion  30 44 49 52  Ankle inversion  5     Ankle eversion  0      (Blank rows = not tested)  LOWER EXTREMITY MMT:  MMT Right eval Left eval Left 06/21/23 Left 07/31/23 08/23/23 left 09/25/23 11/29/23 Left  Hip flexion 4- 3+ 4- 4 4- 4- 4+  Hip extension         Hip abduction 4 3+ 4- 4+ 4 4 4+  Hip adduction 4 3+ 4- 4 4- 4 4+  Hip internal rotation         Hip external rotation         Knee flexion 4- 3+ 4 4 4+ 4+ 4+  Knee extension 4- 3+ 4- 4 4 4 5    Ankle dorsiflexion     4- 4 4+  Ankle plantarflexion     4- 4 4+   Ankle inversion         Ankle eversion          (Blank rows = not tested)  FUNCTIONAL TESTS:  Transfers Max A, left knee gives out and goes into ER and tends to bow into varus, has not walked in about 18 months, and the last time I saw her we could walk about 60 feet with HHA and w/c following  GAIT: Distance walked: unable   TODAY'S TREATMENT:  DATE:  12/18/23 STM to the upper traps, some finger approximation on the left for some movements 5# LAQ, hip abd and hip flexion 2 sets 10 Nustep L 5 500 steps  8 min 30 sec ( c/o bil shld pain so finished with just LE) Green tband HS curls  2 sets 10 Standing march HHA x2 Gait with HHA and w/c follow 140 feet with w/c behind for saefty  12/13/23 STM to the upper traps, some finger approximation on the left for some movements Nustep level 5 x 6:38 minutes 500 steps  5# LAQ Green tband HS curls Standing march HHA x2 Gait with HHA and w/c follow 103'  12/11/23 STM to the upper traps, some finger approximation on the left for some movements Nustep level 5 x 7 minutes 5# LAQ 5# marches Green tband HS curls Standing weight shifts Partial sit ups Gait with HHA and w/c follow 135'  12/06/23 Nustep level 5 x 8 minutes STM to the upper traps and the neck Supine Feet on ball K2C, rotation, bridges, isometric abs Left hip adduction with red tband Bridges SAQ 5# left leg Red tband left ankle Gait HHA with w/c follow 110 feet, rest and then 70 feet  12/04/23 Nustep level 5 x  6:56 minutes 500 steps  Approximation of the fingers STM to the upper traps and the neck   Some with thera gun  Hip ad green 2x10 each HS curls green 2x15  LAQ 5lb 2x15 Gait HHA with w/c follow x 110 feet   11/29/23 Nustep level 5 x 7:06 minutes 500 steps  Approximation of  the fingers STM to the upper traps and the neck   Some with thera gun  Hip ad green 2x10 each HS curls green 2x15  Gait HHA with w/c follow x 70 feet  11/27/23 Nustep level 5 x 7 minutes Approximation of the fingers STM to the upper traps and the neck   Some with thera gun  On mat Table  Sit to stands x5 then 10 marches two rounds  HS curls red 2x10 AROM shoulder flex and abd limited orm Gait HHA with w/c follow x 50 feet x2  11/22/23 NuStep level 5 x 8 minutes Approximation of the fingers STM to the upper traps and the neck  Supine left leg abduction with sliding board Partial sit ups Trunk rotation Bridges Green tband HS curls Gait HHA with w/c follow x 140 feet  11/20/23 Nustep level 5 x 8 minutes Approximation of the left fingers STM to the left upper trap and neck Green tband HS curls Green tband left leg adduction Green tband ankle PF/DF 5# marches 5# LAQ Gait HHA with w/c follow 125 feet fatigue limited today  11/15/23 Nustep level 5 x 8 minutes Partial sit ups Volley ball working on trunk control and core strength Supine bridges and with sliding board left hip abduction Green tband HS curls 5# LAQ STM to the bilateral upper traps Gait with HHA and w/c follow 135 feet  11/13/23 With the first transfer today her left knee went out to the side, poor control and c/o pain Nustep level 5 x 8 minutes Standing weight shifts STM to the upper traps and the neck area LEft leg 5# LAQ Red tband left hip abduction and adduction Red tband ankle PF/DF Green tband HS curls Gait with HHA and w/C follow 130 feet some difficulty with the left knee with soreness and a little more instability than normal  11/08/23 Nustep level 5  x 8 minutes STM to both upper traps and the cervical area Gentle PROM of the left shoulder with cues for her to relax Standing weight shifts 5# LAQ 5# marches Green tband HS curls Green tband PF/DF Gait with HHA and w/c follow 144 feet stopped  due to fatigue PATIENT EDUCATION:  Education details: POC Person educated: Patient Education method: Explanation Education comprehension: verbalized understanding  HOME EXERCISE PROGRAM: 10/11/23 went over with patient and caregiver regarding brace placement and what to look for as far as the padding moving, ankle pumps, tband HS curls  ASSESSMENT:  CLINICAL IMPRESSION: Pt arrives with trigger toe on RT foot and seeing MD this afternoon. Progressing with goals. Pt amb 140 feet today and did very well. A bit slower on nustep but did have BIL shld pain so finished with just LE   OBJECTIVE IMPAIRMENTS: Abnormal gait, cardiopulmonary status limiting activity, decreased activity tolerance, decreased balance, decreased coordination, decreased endurance, decreased mobility, difficulty walking, decreased ROM, decreased strength, increased edema, impaired flexibility, improper body mechanics, postural dysfunction, and pain.   REHAB POTENTIAL: Good  CLINICAL DECISION MAKING: Stable/uncomplicated  EVALUATION COMPLEXITY: Low   GOALS: Goals reviewed with patient? Yes  SHORT TERM GOALS: Target date: 06/02/23 Independent with advanced HEP Goal status: met 06/07/23  LONG TERM GOALS: Target date: 08/21/23  Independent with advanced HEP with caregiver Goal status: progressing 4/115/25  2.  Transfer with set up and CGA Goal status: progressing 11/15/23, ongoing 12/11/23   12/18/23 Progressing  3.  Walk HHA x 100 feet Goal status: met 11/22/23  4.  Increase left LE strength to 4/5 Goal status: Met 07/31/23  5.  Report neck and shoulder pain decreased 50% Goal status: Progressing 30% 11/15/23, ongoing 12/11/23  PLAN:  PT FREQUENCY: 1-2x/week  PT DURATION: 12 weeks  PLANNED INTERVENTIONS: Therapeutic exercises, Therapeutic activity, Neuromuscular re-education, Balance training, Gait training, Patient/Family education, Self Care, Joint mobilization, Moist heat, Taping, and Manual  therapy  PLAN FOR NEXT SESSION:  Continue for a little longer talk more with the caregiver about the walking and HEP   Drema Eddington,ANGIE, PTA [facility] at [facility]. [place], [place], [address] Phone: [phone]   Fax:  [phone]  Patient Details  Name: REYNA LORENZI MRN: [id number] Date of Birth: [birth year] Referring Provider:  Aldo Hun, MD  Encounter Date: 12/18/2023   Aquilla Bayley, PTA 12/18/2023, 11:00 AM  [facility]. [place], [place], [address] Phone: [phone]   Fax:  [phone]

## 2023-12-20 ENCOUNTER — Ambulatory Visit: Admitting: Physical Therapy

## 2023-12-20 ENCOUNTER — Encounter: Payer: Self-pay | Admitting: Physical Therapy

## 2023-12-20 DIAGNOSIS — M6281 Muscle weakness (generalized): Secondary | ICD-10-CM | POA: Diagnosis not present

## 2023-12-20 DIAGNOSIS — I6990 Unspecified sequelae of unspecified cerebrovascular disease: Secondary | ICD-10-CM

## 2023-12-20 DIAGNOSIS — R270 Ataxia, unspecified: Secondary | ICD-10-CM

## 2023-12-20 DIAGNOSIS — M542 Cervicalgia: Secondary | ICD-10-CM

## 2023-12-20 DIAGNOSIS — I69354 Hemiplegia and hemiparesis following cerebral infarction affecting left non-dominant side: Secondary | ICD-10-CM

## 2023-12-20 DIAGNOSIS — R293 Abnormal posture: Secondary | ICD-10-CM | POA: Diagnosis not present

## 2023-12-20 DIAGNOSIS — M25512 Pain in left shoulder: Secondary | ICD-10-CM

## 2023-12-20 DIAGNOSIS — R262 Difficulty in walking, not elsewhere classified: Secondary | ICD-10-CM

## 2023-12-20 DIAGNOSIS — M25572 Pain in left ankle and joints of left foot: Secondary | ICD-10-CM

## 2023-12-20 NOTE — Therapy (Signed)
 OUTPATIENT PHYSICAL THERAPY LOWER EXTREMITY TREATMENT       Patient Name: Karen Jackson MRN: [id number] DOB:[birth year], 83 y.o., female Today's Date: 12/20/2023  END OF SESSION:  PT End of Session - 12/20/23 1103     Visit Number 58    Date for PT Re-Evaluation 12/30/23    PT Start Time 1100    PT Stop Time 1145    PT Time Calculation (min) 45 min    Activity Tolerance Patient tolerated treatment well    Behavior During Therapy [facility] for tasks assessed/performed             Past Medical History:  Diagnosis Date   Acute cystitis without hematuria    Acute diastolic CHF (congestive heart failure) (HCC)    Arthritis    Dyspnea    Dysrhythmia    Fever of unknown origin 03/19/2017   Hyperlipidemia    Hypertension    denies at preop   Multifocal pneumonia    Neuromuscular disorder (HCC)    neuropathy left arm and foot   Osteopenia    Paralysis (HCC)    partial left side from CVA    Persistent atrial fibrillation (HCC)    PONV (postoperative nausea and vomiting)    Pre-diabetes    Stroke [facility]) 2013   hemmorahgic   Past Surgical History:  Procedure Laterality Date   ANKLE SURGERY     APPENDECTOMY     CHOLECYSTECTOMY     HARDWARE REMOVAL Left 03/29/2023   Procedure: HARDWARE REMOVAL;  Surgeon: Amada Backer, MD;  Location: [facility];  Service: Orthopedics;  Laterality: Left;   HERNIA REPAIR     Esophagus   INCISION AND DRAINAGE OF WOUND Left 03/29/2023   Procedure: LEFT ANKLE WOUND IRRIGATION AND DEBRIDEMENT WOUND;  Surgeon: Amada Backer, MD;  Location: [facility];  Service: Orthopedics;  Laterality: Left;   JOINT REPLACEMENT     total- right partial- left   MASTECTOMY PARTIAL / LUMPECTOMY  2012   left   ORIF ANKLE FRACTURE Left 07/20/2018   Procedure: OPEN REDUCTION INTERNAL FIXATION (ORIF) ANKLE FRACTURE;  Surgeon: Amada Backer, MD;  Location: [facility];  Service: Orthopedics;  Laterality: Left;   TOTAL KNEE ARTHROPLASTY Left 01/27/2019    Procedure: TOTAL KNEE ARTHROPLASTY;  Surgeon: Christie Cox, MD;  Location: [facility];  Service: Orthopedics;  Laterality: Left;   Patient Active Problem List   Diagnosis Date Noted   Goals of care, counseling/discussion 01/17/2022   Grief 01/17/2022   Secondary hypercoagulable state (HCC) 03/16/2021   Dizziness 03/10/2021   Orthostatic hypotension 10/15/2020   Presbycusis of both ears 03/08/2020   Mixed stress and urge urinary incontinence 12/02/2019   Macrocytosis 12/01/2019   Nutritional anemia 12/01/2019   S/P total knee replacement 01/27/2019   Recurrent left knee instability 07/05/2018   Respiratory failure with hypoxia (HCC) 08/30/2017   Hypoxemia    Heart failure with preserved ejection fraction (HCC), Grade 3 diastolic dysfunction 03/26/2017   PAF (paroxysmal atrial fibrillation) (HCC)    Dyspnea 03/19/2017   Encounter for preventive health examination 02/17/2016   Sensorineural hearing loss (SNHL), bilateral 01/26/2016   Hypomagnesemia 04/24/2014   Hemiparesis affecting left side as late effect of cerebrovascular accident (HCC) 04/24/2014   Nontraumatic cerebral hemorrhage (HCC) 04/30/2012   DM (diabetes mellitus) with complications (HCC) 03/04/2010   OSTEOPENIA 01/21/2009   UNSPECIFIED VITAMIN D  DEFICIENCY 11/19/2007   HYPERCHOLESTEROLEMIA 10/25/2006   GASTROESOPHAGEAL REFLUX, NO ESOPHAGITIS 10/25/2006   DIVERTICULOSIS OF COLON 10/25/2006  Osteoarthritis 10/25/2006   CERVICAL SPINE DISORDER, NOS 10/25/2006    PCP: Genelle Kennedy, MD  REFERRING PROVIDER: Genelle Kennedy, MD  REFERRING DIAG: s/p left ankle ORIF hardware removal  THERAPY DIAG:  Muscle weakness (generalized)  Difficulty in walking, not elsewhere classified  Left shoulder pain, unspecified chronicity  Pain in left ankle and joints of left foot  Cervicalgia  Late effects of CVA (cerebrovascular accident)  Hemiplegia and hemiparesis following cerebral infarction affecting left non-dominant side (HCC)  Abnormal  posture  Ataxia  Rationale for Evaluation and Treatment: Rehabilitation  ONSET DATE: 03/29/23  SUBJECTIVE:   SUBJECTIVE STATEMENT: Trigger toe,saw PA last week, is going to podiatrist next week, it is "on my good side"  Trying a different shoe today  PERTINENT HISTORY: CVA, CHF, HTN, A-fib, TKA PAIN:  Are you having pain? Yes: NPRS scale: 6/10 Pain location: shoulder and knee  Pain description: ache sore Aggravating factors: neck and shoulder always hurt, knee and ankle hurt with transfers Relieving factors: massage, heat  PRECAUTIONS: None  RED FLAGS: None   WEIGHT BEARING RESTRICTIONS: No  FALLS:  Has patient fallen in last 6 months? No  LIVING ENVIRONMENT: Lives with: lives alone Lives in: House/apartment Stairs: No Has following equipment at home: Environmental consultant - 2 wheeled, Wheelchair (manual), shower chair, bed side commode, Grab bars, and Ramped entry  OCCUPATION: retired  PLOF: Needs assistance with homemaking, Needs assistance with transfers, and Leisure: plays bridge, she has 11 hours of an aide at home that helps with meals, going to MD's, dressing and bathing, is alone at night  PATIENT GOALS: walk, have less pain, transfer without difficulty  NEXT MD VISIT: none scheduled  OBJECTIVE:   DIAGNOSTIC FINDINGS: none performed  COGNITION: Overall cognitive status: Within functional limits for tasks assessed     SENSATION: WFL POSTURE: rounded shoulders and forward head  PALPATION: Left knee and ankle are swollen and tender, left upper and neck  LOWER EXTREMITY ROM:  Active ROM Right eval Left eval  Left 07/31/23 Left 08/16/23  Hip flexion       Hip extension       Hip abduction       Hip adduction       Hip internal rotation       Hip external rotation       Knee flexion  90 93 96 95  Knee extension  0 0 0 0  Ankle dorsiflexion  5     Ankle plantarflexion  30 44 49 52  Ankle inversion  5     Ankle eversion  0      (Blank rows = not  tested)  LOWER EXTREMITY MMT:  MMT Right eval Left eval Left 06/21/23 Left 07/31/23 08/23/23 left 09/25/23 11/29/23 Left  Hip flexion 4- 3+ 4- 4 4- 4- 4+  Hip extension         Hip abduction 4 3+ 4- 4+ 4 4 4+  Hip adduction 4 3+ 4- 4 4- 4 4+  Hip internal rotation         Hip external rotation         Knee flexion 4- 3+ 4 4 4+ 4+ 4+  Knee extension 4- 3+ 4- 4 4 4 5   Ankle dorsiflexion     4- 4 4+  Ankle plantarflexion     4- 4 4+   Ankle inversion         Ankle eversion          (Blank rows = not tested)  FUNCTIONAL TESTS:  Transfers Max A, left knee gives out and goes into ER and tends to bow into varus, has not walked in about 18 months, and the last time I saw her we could walk about 60 feet with HHA and w/c following  GAIT: Distance walked: unable   TODAY'S TREATMENT:                                                                                                                              DATE: 12/20/23 Nustep level 5 x 6 minutes STM to the neck and upper traps 5# LAQ 5# marches PROM of the shoulders as she was c/o increased pain today Shoulder rolls Scapular retraction Gait with HHA and w/c follow x 140 feet Green tband left ankle exercises Green tband hip adduction  12/18/23 STM to the upper traps, some finger approximation on the left for some movements 5# LAQ, hip abd and hip flexion 2 sets 10 Nustep L 5 500 steps  8 min 30 sec ( c/o bil shld pain so finished with just LE) Green tband HS curls  2 sets 10 Standing march HHA x2 Gait with HHA and w/c follow 140 feet with w/c behind for saefty  12/13/23 STM to the upper traps, some finger approximation on the left for some movements Nustep level 5 x 6:38 minutes 500 steps  5# LAQ Green tband HS curls Standing march HHA x2 Gait with HHA and w/c follow 16'  12/11/23 STM to the upper traps, some finger approximation on the left for some movements Nustep level 5 x 7 minutes 5# LAQ 5# marches Green tband HS  curls Standing weight shifts Partial sit ups Gait with HHA and w/c follow 135'  12/06/23 Nustep level 5 x 8 minutes STM to the upper traps and the neck Supine Feet on ball K2C, rotation, bridges, isometric abs Left hip adduction with red tband Bridges SAQ 5# left leg Red tband left ankle Gait HHA with w/c follow 110 feet, rest and then 70 feet  12/04/23 Nustep level 5 x  6:56 minutes 500 steps  Approximation of the fingers STM to the upper traps and the neck   Some with thera gun  Hip ad green 2x10 each HS curls green 2x15  LAQ 5lb 2x15 Gait HHA with w/c follow x 110 feet   11/29/23 Nustep level 5 x 7:06 minutes 500 steps  Approximation of the fingers STM to the upper traps and the neck   Some with thera gun  Hip ad green 2x10 each HS curls green 2x15  Gait HHA with w/c follow x 70 feet  11/27/23 Nustep level 5 x 7 minutes Approximation of the fingers STM to the upper traps and the neck   Some with thera gun  On mat Table  Sit to stands x5 then 10 marches two rounds  HS curls red 2x10 AROM shoulder flex and abd limited orm Gait HHA with w/c follow x 50  feet x2  11/22/23 NuStep level 5 x 8 minutes Approximation of the fingers STM to the upper traps and the neck  Supine left leg abduction with sliding board Partial sit ups Trunk rotation Bridges Green tband HS curls Gait HHA with w/c follow x 140 feet  11/20/23 Nustep level 5 x 8 minutes Approximation of the left fingers STM to the left upper trap and neck Green tband HS curls Green tband left leg adduction Green tband ankle PF/DF 5# marches 5# LAQ Gait HHA with w/c follow 125 feet fatigue limited today  11/15/23 Nustep level 5 x 8 minutes Partial sit ups Volley ball working on trunk control and core strength Supine bridges and with sliding board left hip abduction Green tband HS curls 5# LAQ STM to the bilateral upper traps Gait with HHA and w/c follow 135 feet  11/13/23 With the first transfer today  her left knee went out to the side, poor control and c/o pain Nustep level 5 x 8 minutes Standing weight shifts STM to the upper traps and the neck area LEft leg 5# LAQ Red tband left hip abduction and adduction Red tband ankle PF/DF Green tband HS curls Gait with HHA and w/C follow 130 feet some difficulty with the left knee with soreness and a little more instability than normal  11/08/23 Nustep level 5 x 8 minutes STM to both upper traps and the cervical area Gentle PROM of the left shoulder with cues for her to relax Standing weight shifts 5# LAQ 5# marches Green tband HS curls Green tband PF/DF Gait with HHA and w/c follow 144 feet stopped due to fatigue PATIENT EDUCATION:  Education details: POC Person educated: Patient Education method: Explanation Education comprehension: verbalized understanding  HOME EXERCISE PROGRAM: 10/11/23 went over with patient and caregiver regarding brace placement and what to look for as far as the padding moving, ankle pumps, tband HS curls  ASSESSMENT:  CLINICAL IMPRESSION: Pt arrives with trigger toe on RT foot and seeing a podiatrist next week. She does have on different shoes today and reports that the toe issue does not bother her as far as pain with walking.  She is reporting increased shoulder pain today and does have a referral for OT and will start that in a week or two.    OBJECTIVE IMPAIRMENTS: Abnormal gait, cardiopulmonary status limiting activity, decreased activity tolerance, decreased balance, decreased coordination, decreased endurance, decreased mobility, difficulty walking, decreased ROM, decreased strength, increased edema, impaired flexibility, improper body mechanics, postural dysfunction, and pain.   REHAB POTENTIAL: Good  CLINICAL DECISION MAKING: Stable/uncomplicated  EVALUATION COMPLEXITY: Low   GOALS: Goals reviewed with patient? Yes  SHORT TERM GOALS: Target date: 06/02/23 Independent with advanced HEP Goal  status: met 06/07/23  LONG TERM GOALS: Target date: 08/21/23  Independent with advanced HEP with caregiver Goal status: progressing 4/115/25  2.  Transfer with set up and CGA Goal status: progressing 11/15/23, ongoing 12/11/23   12/18/23 Progressing  3.  Walk HHA x 100 feet Goal status: met 11/22/23  4.  Increase left LE strength to 4/5 Goal status: Met 07/31/23  5.  Report neck and shoulder pain decreased 50% Goal status: Progressing 30% 11/15/23, ongoing 12/11/23  PLAN:  PT FREQUENCY: 1-2x/week  PT DURATION: 12 weeks  PLANNED INTERVENTIONS: Therapeutic exercises, Therapeutic activity, Neuromuscular re-education, Balance training, Gait training, Patient/Family education, Self Care, Joint mobilization, Moist heat, Taping, and Manual therapy  PLAN FOR NEXT SESSION:  looking to D/C in the next few weeks  Hollis Lurie, PT [place] [facility] at [facility] [address] [facility] [place], [place], [address] Phone: [phone]   Fax:  [phone]

## 2023-12-21 ENCOUNTER — Other Ambulatory Visit (HOSPITAL_COMMUNITY): Payer: Self-pay

## 2023-12-24 ENCOUNTER — Encounter (HOSPITAL_COMMUNITY)
Admission: RE | Admit: 2023-12-24 | Discharge: 2023-12-24 | Disposition: A | Discharge status: Home / Self Care | Source: Ambulatory Visit | Attending: Internal Medicine | Admitting: Internal Medicine

## 2023-12-24 DIAGNOSIS — M810 Age-related osteoporosis without current pathological fracture: Secondary | ICD-10-CM | POA: Diagnosis not present

## 2023-12-24 MED ORDER — DENOSUMAB 60 MG/ML ~~LOC~~ SOSY
PREFILLED_SYRINGE | SUBCUTANEOUS | Status: AC
  Filled 2023-12-24: qty 1

## 2023-12-24 MED ORDER — DENOSUMAB 60 MG/ML ~~LOC~~ SOSY
60.0000 mg | PREFILLED_SYRINGE | Freq: Once | SUBCUTANEOUS | Status: AC
  Administered 2023-12-24: 60 mg via SUBCUTANEOUS

## 2023-12-25 ENCOUNTER — Ambulatory Visit: Admitting: Physical Therapy

## 2023-12-25 ENCOUNTER — Encounter: Payer: Self-pay | Admitting: Physical Therapy

## 2023-12-25 DIAGNOSIS — M542 Cervicalgia: Secondary | ICD-10-CM | POA: Diagnosis not present

## 2023-12-25 DIAGNOSIS — R293 Abnormal posture: Secondary | ICD-10-CM | POA: Diagnosis not present

## 2023-12-25 DIAGNOSIS — M25512 Pain in left shoulder: Secondary | ICD-10-CM | POA: Diagnosis not present

## 2023-12-25 DIAGNOSIS — R262 Difficulty in walking, not elsewhere classified: Secondary | ICD-10-CM | POA: Diagnosis not present

## 2023-12-25 DIAGNOSIS — R270 Ataxia, unspecified: Secondary | ICD-10-CM

## 2023-12-25 DIAGNOSIS — M6281 Muscle weakness (generalized): Secondary | ICD-10-CM | POA: Diagnosis not present

## 2023-12-25 DIAGNOSIS — M25572 Pain in left ankle and joints of left foot: Secondary | ICD-10-CM | POA: Diagnosis not present

## 2023-12-25 DIAGNOSIS — I69354 Hemiplegia and hemiparesis following cerebral infarction affecting left non-dominant side: Secondary | ICD-10-CM

## 2023-12-25 NOTE — Therapy (Signed)
 OUTPATIENT PHYSICAL THERAPY LOWER EXTREMITY TREATMENT       Patient Name: Karen Jackson MRN: [id number] DOB:1941/01/27, 83 y.o., female Today's Date: 12/25/2023  END OF SESSION:  PT End of Session - 12/25/23 1020     Visit Number 59    Date for PT Re-Evaluation 12/30/23    PT Start Time 1015    PT Stop Time 1100    PT Time Calculation (min) 45 min    Activity Tolerance Patient tolerated treatment well    Behavior During Therapy [facility] for tasks assessed/performed             Past Medical History:  Diagnosis Date   Acute cystitis without hematuria    Acute diastolic CHF (congestive heart failure) (HCC)    Arthritis    Dyspnea    Dysrhythmia    Fever of unknown origin 03/19/2017   Hyperlipidemia    Hypertension    denies at preop   Multifocal pneumonia    Neuromuscular disorder (HCC)    neuropathy left arm and foot   Osteopenia    Paralysis (HCC)    partial left side from CVA    Persistent atrial fibrillation (HCC)    PONV (postoperative nausea and vomiting)    Pre-diabetes    Stroke [facility]) 2013   hemmorahgic   Past Surgical History:  Procedure Laterality Date   ANKLE SURGERY     APPENDECTOMY     CHOLECYSTECTOMY     HARDWARE REMOVAL Left 03/29/2023   Procedure: HARDWARE REMOVAL;  Surgeon: Amada Backer, MD;  Location: [name];  Service: Orthopedics;  Laterality: Left;   HERNIA REPAIR     Esophagus   INCISION AND DRAINAGE OF WOUND Left 03/29/2023   Procedure: LEFT ANKLE WOUND IRRIGATION AND DEBRIDEMENT WOUND;  Surgeon: Amada Backer, MD;  Location: [facility];  Service: Orthopedics;  Laterality: Left;   JOINT REPLACEMENT     total- right partial- left   MASTECTOMY PARTIAL / LUMPECTOMY  2012   left   ORIF ANKLE FRACTURE Left 07/20/2018   Procedure: OPEN REDUCTION INTERNAL FIXATION (ORIF) ANKLE FRACTURE;  Surgeon: Amada Backer, MD;  Location: [facility];  Service: Orthopedics;  Laterality: Left;   TOTAL KNEE ARTHROPLASTY Left 01/27/2019    Procedure: TOTAL KNEE ARTHROPLASTY;  Surgeon: Christie Cox, MD;  Location: [facility];  Service: Orthopedics;  Laterality: Left;   Patient Active Problem List   Diagnosis Date Noted   Goals of care, counseling/discussion 01/17/2022   Grief 01/17/2022   Secondary hypercoagulable state (HCC) 03/16/2021   Dizziness 03/10/2021   Orthostatic hypotension 10/15/2020   Presbycusis of both ears 03/08/2020   Mixed stress and urge urinary incontinence 12/02/2019   Macrocytosis 12/01/2019   Nutritional anemia 12/01/2019   S/P total knee replacement 01/27/2019   Recurrent left knee instability 07/05/2018   Respiratory failure with hypoxia (HCC) 08/30/2017   Hypoxemia    Heart failure with preserved ejection fraction (HCC), Grade 3 diastolic dysfunction 03/26/2017   PAF (paroxysmal atrial fibrillation) (HCC)    Dyspnea 03/19/2017   Encounter for preventive health examination 02/17/2016   Sensorineural hearing loss (SNHL), bilateral 01/26/2016   Hypomagnesemia 04/24/2014   Hemiparesis affecting left side as late effect of cerebrovascular accident (HCC) 04/24/2014   Nontraumatic cerebral hemorrhage (HCC) 04/30/2012   DM (diabetes mellitus) with complications (HCC) 03/04/2010   OSTEOPENIA 01/21/2009   UNSPECIFIED VITAMIN D  DEFICIENCY 11/19/2007   HYPERCHOLESTEROLEMIA 10/25/2006   GASTROESOPHAGEAL REFLUX, NO ESOPHAGITIS 10/25/2006   DIVERTICULOSIS OF COLON 10/25/2006  Osteoarthritis 10/25/2006   CERVICAL SPINE DISORDER, NOS 10/25/2006    PCP: Genelle Kennedy, MD  REFERRING PROVIDER: Genelle Kennedy, MD  REFERRING DIAG: s/p left ankle ORIF hardware removal  THERAPY DIAG:  Muscle weakness (generalized)  Difficulty in walking, not elsewhere classified  Left shoulder pain, unspecified chronicity  Pain in left ankle and joints of left foot  Cervicalgia  Abnormal posture  Hemiplegia and hemiparesis following cerebral infarction affecting left non-dominant side (HCC)  Ataxia  Rationale for Evaluation and  Treatment: Rehabilitation  ONSET DATE: 03/29/23  SUBJECTIVE:   SUBJECTIVE STATEMENT: Still a lot of pain in the shoulders and neck, is seeing podiatrist on Friday  PERTINENT HISTORY: CVA, CHF, HTN, A-fib, TKA PAIN:  Are you having pain? Yes: NPRS scale: 6/10 Pain location: shoulder and knee  Pain description: ache sore Aggravating factors: neck and shoulder always hurt, knee and ankle hurt with transfers Relieving factors: massage, heat  PRECAUTIONS: None  RED FLAGS: None   WEIGHT BEARING RESTRICTIONS: No  FALLS:  Has patient fallen in last 6 months? No  LIVING ENVIRONMENT: Lives with: lives alone Lives in: House/apartment Stairs: No Has following equipment at home: Environmental consultant - 2 wheeled, Wheelchair (manual), shower chair, bed side commode, Grab bars, and Ramped entry  OCCUPATION: retired  PLOF: Needs assistance with homemaking, Needs assistance with transfers, and Leisure: plays bridge, she has 11 hours of an aide at home that helps with meals, going to MD's, dressing and bathing, is alone at night  PATIENT GOALS: walk, have less pain, transfer without difficulty  NEXT MD VISIT: none scheduled  OBJECTIVE:   DIAGNOSTIC FINDINGS: none performed  COGNITION: Overall cognitive status: Within functional limits for tasks assessed     SENSATION: WFL POSTURE: rounded shoulders and forward head  PALPATION: Left knee and ankle are swollen and tender, left upper and neck  LOWER EXTREMITY ROM:  Active ROM Right eval Left eval  Left 07/31/23 Left 08/16/23  Hip flexion       Hip extension       Hip abduction       Hip adduction       Hip internal rotation       Hip external rotation       Knee flexion  90 93 96 95  Knee extension  0 0 0 0  Ankle dorsiflexion  5     Ankle plantarflexion  30 44 49 52  Ankle inversion  5     Ankle eversion  0      (Blank rows = not tested)  LOWER EXTREMITY MMT:  MMT Right eval Left eval Left 06/21/23 Left 07/31/23 08/23/23 left  09/25/23 11/29/23 Left  Hip flexion 4- 3+ 4- 4 4- 4- 4+  Hip extension         Hip abduction 4 3+ 4- 4+ 4 4 4+  Hip adduction 4 3+ 4- 4 4- 4 4+  Hip internal rotation         Hip external rotation         Knee flexion 4- 3+ 4 4 4+ 4+ 4+  Knee extension 4- 3+ 4- 4 4 4 5   Ankle dorsiflexion     4- 4 4+  Ankle plantarflexion     4- 4 4+   Ankle inversion         Ankle eversion          (Blank rows = not tested)  FUNCTIONAL TESTS:  Transfers Max A, left knee gives out and goes into ER  and tends to bow into varus, has not walked in about 18 months, and the last time I saw her we could walk about 60 feet with HHA and w/c following  GAIT: Distance walked: unable   TODAY'S TREATMENT:                                                                                                                              DATE: 12/25/23 Nustep level 5 x 7 minutes STM to the upper traps, neck and shoulders Supine partial sit ups Bridges Yellow tband left hip adduction With sliding board left hip abduction Knees tied together trunk rotation Gait with w/c follow 140 feet with HHA  12/20/23 Nustep level 5 x 6 minutes STM to the neck and upper traps 5# LAQ 5# marches PROM of the shoulders as she was c/o increased pain today Shoulder rolls Scapular retraction Gait with HHA and w/c follow x 140 feet Green tband left ankle exercises Green tband hip adduction  12/18/23 STM to the upper traps, some finger approximation on the left for some movements 5# LAQ, hip abd and hip flexion 2 sets 10 Nustep L 5 500 steps  8 min 30 sec ( c/o bil shld pain so finished with just LE) Green tband HS curls  2 sets 10 Standing march HHA x2 Gait with HHA and w/c follow 140 feet with w/c behind for saefty  12/13/23 STM to the upper traps, some finger approximation on the left for some movements Nustep level 5 x 6:38 minutes 500 steps  5# LAQ Green tband HS curls Standing march HHA x2 Gait with HHA and w/c follow  4'  12/11/23 STM to the upper traps, some finger approximation on the left for some movements Nustep level 5 x 7 minutes 5# LAQ 5# marches Green tband HS curls Standing weight shifts Partial sit ups Gait with HHA and w/c follow 135'  12/06/23 Nustep level 5 x 8 minutes STM to the upper traps and the neck Supine Feet on ball K2C, rotation, bridges, isometric abs Left hip adduction with red tband Bridges SAQ 5# left leg Red tband left ankle Gait HHA with w/c follow 110 feet, rest and then 70 feet  12/04/23 Nustep level 5 x  6:56 minutes 500 steps  Approximation of the fingers STM to the upper traps and the neck   Some with thera gun  Hip ad green 2x10 each HS curls green 2x15  LAQ 5lb 2x15 Gait HHA with w/c follow x 110 feet   11/29/23 Nustep level 5 x 7:06 minutes 500 steps  Approximation of the fingers STM to the upper traps and the neck   Some with thera gun  Hip ad green 2x10 each HS curls green 2x15  Gait HHA with w/c follow x 70 feet  11/27/23 Nustep level 5 x 7 minutes Approximation of the fingers STM to the upper traps and the neck   Some with thera gun  On  mat Table  Sit to stands x5 then 10 marches two rounds  HS curls red 2x10 AROM shoulder flex and abd limited orm Gait HHA with w/c follow x 50 feet x2  11/22/23 NuStep level 5 x 8 minutes Approximation of the fingers STM to the upper traps and the neck  Supine left leg abduction with sliding board Partial sit ups Trunk rotation Bridges Green tband HS curls Gait HHA with w/c follow x 140 feet  11/20/23 Nustep level 5 x 8 minutes Approximation of the left fingers STM to the left upper trap and neck Green tband HS curls Green tband left leg adduction Green tband ankle PF/DF 5# marches 5# LAQ Gait HHA with w/c follow 125 feet fatigue limited today  11/15/23 Nustep level 5 x 8 minutes Partial sit ups Volley ball working on trunk control and core strength Supine bridges and with sliding board left  hip abduction Green tband HS curls 5# LAQ STM to the bilateral upper traps Gait with HHA and w/c follow 135 feet  11/13/23 With the first transfer today her left knee went out to the side, poor control and c/o pain Nustep level 5 x 8 minutes Standing weight shifts STM to the upper traps and the neck area LEft leg 5# LAQ Red tband left hip abduction and adduction Red tband ankle PF/DF Green tband HS curls Gait with HHA and w/C follow 130 feet some difficulty with the left knee with soreness and a little more instability than normal  11/08/23 Nustep level 5 x 8 minutes STM to both upper traps and the cervical area Gentle PROM of the left shoulder with cues for her to relax Standing weight shifts 5# LAQ 5# marches Green tband HS curls Green tband PF/DF Gait with HHA and w/c follow 144 feet stopped due to fatigue PATIENT EDUCATION:  Education details: POC Person educated: Patient Education method: Explanation Education comprehension: verbalized understanding  HOME EXERCISE PROGRAM: 10/11/23 went over with patient and caregiver regarding brace placement and what to look for as far as the padding moving, ankle pumps, tband HS curls  ASSESSMENT:  CLINICAL IMPRESSION: Pt arrives with trigger toe on RT foot and seeing a podiatrist next week. She does have on different shoes today and reports that the toe issue does not bother her as far as pain with walking.  She is reporting increased shoulder pain today and does have a referral for OT and will start that in a week or two.    OBJECTIVE IMPAIRMENTS: Abnormal gait, cardiopulmonary status limiting activity, decreased activity tolerance, decreased balance, decreased coordination, decreased endurance, decreased mobility, difficulty walking, decreased ROM, decreased strength, increased edema, impaired flexibility, improper body mechanics, postural dysfunction, and pain.   REHAB POTENTIAL: Good  CLINICAL DECISION MAKING:  Stable/uncomplicated  EVALUATION COMPLEXITY: Low   GOALS: Goals reviewed with patient? Yes  SHORT TERM GOALS: Target date: 06/02/23 Independent with advanced HEP Goal status: met 06/07/23  LONG TERM GOALS: Target date: 08/21/23  Independent with advanced HEP with caregiver Goal status: progressing 4/115/25  2.  Transfer with set up and CGA Goal status: progressing 11/15/23, ongoing 12/11/23   12/18/23 Progressing  3.  Walk HHA x 100 feet Goal status: met 11/22/23  4.  Increase left LE strength to 4/5 Goal status: Met 07/31/23  5.  Report neck and shoulder pain decreased 50% Goal status: Progressing 30% 11/15/23, ongoing 12/11/23  PLAN:  PT FREQUENCY: 1-2x/week  PT DURATION: 12 weeks  PLANNED INTERVENTIONS: Therapeutic exercises, Therapeutic activity, Neuromuscular re-education,  Balance training, Gait training, Patient/Family education, Self Care, Joint mobilization, Moist heat, Taping, and Manual therapy  PLAN FOR NEXT SESSION:  looking to D/C in the this week   Hollis Lurie, PT [place] [facility] at [facility] [address] [name]. [place], [place], [address] Phone: [phone]   Fax:  [phone]

## 2023-12-26 ENCOUNTER — Encounter (HOSPITAL_COMMUNITY)

## 2023-12-26 DIAGNOSIS — E119 Type 2 diabetes mellitus without complications: Secondary | ICD-10-CM | POA: Diagnosis not present

## 2023-12-26 DIAGNOSIS — H5213 Myopia, bilateral: Secondary | ICD-10-CM | POA: Diagnosis not present

## 2023-12-26 DIAGNOSIS — Z961 Presence of intraocular lens: Secondary | ICD-10-CM | POA: Diagnosis not present

## 2023-12-27 ENCOUNTER — Ambulatory Visit: Attending: Internal Medicine | Admitting: Physical Therapy

## 2023-12-27 ENCOUNTER — Encounter: Payer: Self-pay | Admitting: Physical Therapy

## 2023-12-27 DIAGNOSIS — I6990 Unspecified sequelae of unspecified cerebrovascular disease: Secondary | ICD-10-CM

## 2023-12-27 DIAGNOSIS — M542 Cervicalgia: Secondary | ICD-10-CM | POA: Diagnosis not present

## 2023-12-27 DIAGNOSIS — R262 Difficulty in walking, not elsewhere classified: Secondary | ICD-10-CM

## 2023-12-27 DIAGNOSIS — I69354 Hemiplegia and hemiparesis following cerebral infarction affecting left non-dominant side: Secondary | ICD-10-CM

## 2023-12-27 DIAGNOSIS — R293 Abnormal posture: Secondary | ICD-10-CM | POA: Diagnosis not present

## 2023-12-27 DIAGNOSIS — M25512 Pain in left shoulder: Secondary | ICD-10-CM

## 2023-12-27 DIAGNOSIS — M25572 Pain in left ankle and joints of left foot: Secondary | ICD-10-CM

## 2023-12-27 DIAGNOSIS — R270 Ataxia, unspecified: Secondary | ICD-10-CM

## 2023-12-27 DIAGNOSIS — M6281 Muscle weakness (generalized): Secondary | ICD-10-CM

## 2023-12-27 DIAGNOSIS — R278 Other lack of coordination: Secondary | ICD-10-CM | POA: Diagnosis not present

## 2023-12-27 NOTE — Therapy (Signed)
 OUTPATIENT PHYSICAL THERAPY LOWER EXTREMITY TREATMENT       Patient Name: Karen Jackson MRN: [id number] DOB:May 03, 1941, 83 y.o., female Today's Date: 12/27/2023  END OF SESSION:  PT End of Session - 12/27/23 1102     Visit Number 60    Date for PT Re-Evaluation 12/30/23    PT Start Time 1058    PT Stop Time 1143    PT Time Calculation (min) 45 min    Activity Tolerance Patient tolerated treatment well    Behavior During Therapy [facility] for tasks assessed/performed             Past Medical History:  Diagnosis Date   Acute cystitis without hematuria    Acute diastolic CHF (congestive heart failure) (HCC)    Arthritis    Dyspnea    Dysrhythmia    Fever of unknown origin 03/19/2017   Hyperlipidemia    Hypertension    denies at preop   Multifocal pneumonia    Neuromuscular disorder (HCC)    neuropathy left arm and foot   Osteopenia    Paralysis (HCC)    partial left side from CVA    Persistent atrial fibrillation (HCC)    PONV (postoperative nausea and vomiting)    Pre-diabetes    Stroke [facility]) 2013   hemmorahgic   Past Surgical History:  Procedure Laterality Date   ANKLE SURGERY     APPENDECTOMY     CHOLECYSTECTOMY     HARDWARE REMOVAL Left 03/29/2023   Procedure: HARDWARE REMOVAL;  Surgeon: Amada Backer, MD;  Location: [facility];  Service: Orthopedics;  Laterality: Left;   HERNIA REPAIR     Esophagus   INCISION AND DRAINAGE OF WOUND Left 03/29/2023   Procedure: LEFT ANKLE WOUND IRRIGATION AND DEBRIDEMENT WOUND;  Surgeon: Amada Backer, MD;  Location: [facility];  Service: Orthopedics;  Laterality: Left;   JOINT REPLACEMENT     total- right partial- left   MASTECTOMY PARTIAL / LUMPECTOMY  2012   left   ORIF ANKLE FRACTURE Left 07/20/2018   Procedure: OPEN REDUCTION INTERNAL FIXATION (ORIF) ANKLE FRACTURE;  Surgeon: Amada Backer, MD;  Location: [facility];  Service: Orthopedics;  Laterality: Left;   TOTAL KNEE ARTHROPLASTY Left 01/27/2019    Procedure: TOTAL KNEE ARTHROPLASTY;  Surgeon: Christie Cox, MD;  Location: [facility];  Service: Orthopedics;  Laterality: Left;   Patient Active Problem List   Diagnosis Date Noted   Goals of care, counseling/discussion 01/17/2022   Grief 01/17/2022   Secondary hypercoagulable state (HCC) 03/16/2021   Dizziness 03/10/2021   Orthostatic hypotension 10/15/2020   Presbycusis of both ears 03/08/2020   Mixed stress and urge urinary incontinence 12/02/2019   Macrocytosis 12/01/2019   Nutritional anemia 12/01/2019   S/P total knee replacement 01/27/2019   Recurrent left knee instability 07/05/2018   Respiratory failure with hypoxia (HCC) 08/30/2017   Hypoxemia    Heart failure with preserved ejection fraction (HCC), Grade 3 diastolic dysfunction 03/26/2017   PAF (paroxysmal atrial fibrillation) (HCC)    Dyspnea 03/19/2017   Encounter for preventive health examination 02/17/2016   Sensorineural hearing loss (SNHL), bilateral 01/26/2016   Hypomagnesemia 04/24/2014   Hemiparesis affecting left side as late effect of cerebrovascular accident (HCC) 04/24/2014   Nontraumatic cerebral hemorrhage (HCC) 04/30/2012   DM (diabetes mellitus) with complications (HCC) 03/04/2010   OSTEOPENIA 01/21/2009   UNSPECIFIED VITAMIN D  DEFICIENCY 11/19/2007   HYPERCHOLESTEROLEMIA 10/25/2006   GASTROESOPHAGEAL REFLUX, NO ESOPHAGITIS 10/25/2006   DIVERTICULOSIS OF COLON 10/25/2006  Osteoarthritis 10/25/2006   CERVICAL SPINE DISORDER, NOS 10/25/2006    PCP: Genelle Kennedy, MD  REFERRING PROVIDER: Genelle Kennedy, MD  REFERRING DIAG: s/p left ankle ORIF hardware removal  THERAPY DIAG:  Muscle weakness (generalized)  Difficulty in walking, not elsewhere classified  Left shoulder pain, unspecified chronicity  Pain in left ankle and joints of left foot  Cervicalgia  Hemiplegia and hemiparesis following cerebral infarction affecting left non-dominant side (HCC)  Ataxia  Late effects of CVA (cerebrovascular  accident)  Rationale for Evaluation and Treatment: Rehabilitation  ONSET DATE: 03/29/23  SUBJECTIVE:   SUBJECTIVE STATEMENT: Doing okay, I worry about going backwards when not coming here.  Reports knee is sore, she reports that she has gone to appointments every day this week.  PERTINENT HISTORY: CVA, CHF, HTN, A-fib, TKA PAIN:  Are you having pain? Yes: NPRS scale: 6/10 Pain location: shoulder and knee  Pain description: ache sore Aggravating factors: neck and shoulder always hurt, knee and ankle hurt with transfers Relieving factors: massage, heat  PRECAUTIONS: None  RED FLAGS: None   WEIGHT BEARING RESTRICTIONS: No  FALLS:  Has patient fallen in last 6 months? No  LIVING ENVIRONMENT: Lives with: lives alone Lives in: House/apartment Stairs: No Has following equipment at home: Environmental consultant - 2 wheeled, Wheelchair (manual), shower chair, bed side commode, Grab bars, and Ramped entry  OCCUPATION: retired  PLOF: Needs assistance with homemaking, Needs assistance with transfers, and Leisure: plays bridge, she has 11 hours of an aide at home that helps with meals, going to MD's, dressing and bathing, is alone at night  PATIENT GOALS: walk, have less pain, transfer without difficulty  NEXT MD VISIT: none scheduled  OBJECTIVE:   DIAGNOSTIC FINDINGS: none performed  COGNITION: Overall cognitive status: Within functional limits for tasks assessed     SENSATION: WFL POSTURE: rounded shoulders and forward head  PALPATION: Left knee and ankle are swollen and tender, left upper and neck  LOWER EXTREMITY ROM:  Active ROM Right eval Left eval  Left 07/31/23 Left 08/16/23  Hip flexion       Hip extension       Hip abduction       Hip adduction       Hip internal rotation       Hip external rotation       Knee flexion  90 93 96 95  Knee extension  0 0 0 0  Ankle dorsiflexion  5     Ankle plantarflexion  30 44 49 52  Ankle inversion  5     Ankle eversion  0       (Blank rows = not tested)  LOWER EXTREMITY MMT:  MMT Right eval Left eval Left 06/21/23 Left 07/31/23 08/23/23 left 09/25/23 11/29/23 Left  Hip flexion 4- 3+ 4- 4 4- 4- 4+  Hip extension         Hip abduction 4 3+ 4- 4+ 4 4 4+  Hip adduction 4 3+ 4- 4 4- 4 4+  Hip internal rotation         Hip external rotation         Knee flexion 4- 3+ 4 4 4+ 4+ 4+  Knee extension 4- 3+ 4- 4 4 4 5   Ankle dorsiflexion     4- 4 4+  Ankle plantarflexion     4- 4 4+   Ankle inversion         Ankle eversion          (Blank rows =  not tested)  FUNCTIONAL TESTS:  Transfers Max A, left knee gives out and goes into ER and tends to bow into varus, has not walked in about 18 months, and the last time I saw her we could walk about 60 feet with HHA and w/c following  GAIT: Distance walked: unable   TODAY'S TREATMENT:                                                                                                                              DATE: 12/27/23 Nustep level 5 x 7 minutes Went over HEP and what she can do with the caregiver, reviewed this and walking as well as safety STM to the upper traps, shoulders and neck Gait with HHA and W/C follow 180 feet today the farthest she has done in a very long time, short of breath.   12/25/23 Nustep level 5 x 7 minutes STM to the upper traps, neck and shoulders Supine partial sit ups Bridges Yellow tband left hip adduction With sliding board left hip abduction Knees tied together trunk rotation Gait with w/c follow 140 feet with HHA  12/20/23 Nustep level 5 x 6 minutes STM to the neck and upper traps 5# LAQ 5# marches PROM of the shoulders as she was c/o increased pain today Shoulder rolls Scapular retraction Gait with HHA and w/c follow x 140 feet Green tband left ankle exercises Green tband hip adduction  12/18/23 STM to the upper traps, some finger approximation on the left for some movements 5# LAQ, hip abd and hip flexion 2 sets 10 Nustep L 5  500 steps  8 min 30 sec ( c/o bil shld pain so finished with just LE) Green tband HS curls  2 sets 10 Standing march HHA x2 Gait with HHA and w/c follow 140 feet with w/c behind for saefty  12/13/23 STM to the upper traps, some finger approximation on the left for some movements Nustep level 5 x 6:38 minutes 500 steps  5# LAQ Green tband HS curls Standing march HHA x2 Gait with HHA and w/c follow 10'  12/11/23 STM to the upper traps, some finger approximation on the left for some movements Nustep level 5 x 7 minutes 5# LAQ 5# marches Green tband HS curls Standing weight shifts Partial sit ups Gait with HHA and w/c follow 135'  12/06/23 Nustep level 5 x 8 minutes STM to the upper traps and the neck Supine Feet on ball K2C, rotation, bridges, isometric abs Left hip adduction with red tband Bridges SAQ 5# left leg Red tband left ankle Gait HHA with w/c follow 110 feet, rest and then 70 feet  12/04/23 Nustep level 5 x  6:56 minutes 500 steps  Approximation of the fingers STM to the upper traps and the neck   Some with thera gun  Hip ad green 2x10 each HS curls green 2x15  LAQ 5lb 2x15 Gait HHA with w/c follow x 110 feet  11/29/23 Nustep level 5 x 7:06 minutes 500 steps  Approximation of the fingers STM to the upper traps and the neck   Some with thera gun  Hip ad green 2x10 each HS curls green 2x15  Gait HHA with w/c follow x 70 feet  11/27/23 Nustep level 5 x 7 minutes Approximation of the fingers STM to the upper traps and the neck   Some with thera gun  On mat Table  Sit to stands x5 then 10 marches two rounds  HS curls red 2x10 AROM shoulder flex and abd limited orm Gait HHA with w/c follow x 50 feet x2  11/22/23 NuStep level 5 x 8 minutes Approximation of the fingers STM to the upper traps and the neck  Supine left leg abduction with sliding board Partial sit ups Trunk rotation Bridges Green tband HS curls Gait HHA with w/c follow x 140  feet  11/20/23 Nustep level 5 x 8 minutes Approximation of the left fingers STM to the left upper trap and neck Green tband HS curls Green tband left leg adduction Green tband ankle PF/DF 5# marches 5# LAQ Gait HHA with w/c follow 125 feet fatigue limited today  11/15/23 Nustep level 5 x 8 minutes Partial sit ups Volley ball working on trunk control and core strength Supine bridges and with sliding board left hip abduction Green tband HS curls 5# LAQ STM to the bilateral upper traps Gait with HHA and w/c follow 135 feet  11/13/23 With the first transfer today her left knee went out to the side, poor control and c/o pain Nustep level 5 x 8 minutes Standing weight shifts STM to the upper traps and the neck area LEft leg 5# LAQ Red tband left hip abduction and adduction Red tband ankle PF/DF Green tband HS curls Gait with HHA and w/C follow 130 feet some difficulty with the left knee with soreness and a little more instability than normal  11/08/23 Nustep level 5 x 8 minutes STM to both upper traps and the cervical area Gentle PROM of the left shoulder with cues for her to relax Standing weight shifts 5# LAQ 5# marches Green tband HS curls Green tband PF/DF Gait with HHA and w/c follow 144 feet stopped due to fatigue PATIENT EDUCATION:  Education details: POC Person educated: Patient Education method: Explanation Education comprehension: verbalized understanding  HOME EXERCISE PROGRAM: 10/11/23 went over with patient and caregiver regarding brace placement and what to look for as far as the padding moving, ankle pumps, tband HS curls  ASSESSMENT:  CLINICAL IMPRESSION: We did some extensive talking about her continuing to work with her caregiver and coming to OT in the future as she has regressed in the past without PT intervention.  Tried to have her say she would ambulate with caregiver at home but she reports she is too afraid with one person, feels she needs two  people for the w/c follow just in case she needs to sit  OBJECTIVE IMPAIRMENTS: Abnormal gait, cardiopulmonary status limiting activity, decreased activity tolerance, decreased balance, decreased coordination, decreased endurance, decreased mobility, difficulty walking, decreased ROM, decreased strength, increased edema, impaired flexibility, improper body mechanics, postural dysfunction, and pain.   REHAB POTENTIAL: Good  CLINICAL DECISION MAKING: Stable/uncomplicated  EVALUATION COMPLEXITY: Low   GOALS: Goals reviewed with patient? Yes  SHORT TERM GOALS: Target date: 06/02/23 Independent with advanced HEP Goal status: met 06/07/23  LONG TERM GOALS: Target date: 08/21/23  Independent with advanced HEP with caregiver Goal status: met 12/27/23  2.  Transfer with set up and CGA Goal status: progressing 11/15/23, ongoing 12/11/23   12/18/23 Progressing wtill needing Min A  3.  Walk HHA x 100 feet Goal status: met 11/22/23  4.  Increase left LE strength to 4/5 Goal status: Met 07/31/23  5.  Report neck and shoulder pain decreased 50% Goal status: Progressing 30% 11/15/23, ongoing 12/11/23 still hurting as of 12/27/23  PLAN:  PT FREQUENCY: 1-2x/week  PT DURATION: 12 weeks  PLANNED INTERVENTIONS: Therapeutic exercises, Therapeutic activity, Neuromuscular re-education, Balance training, Gait training, Patient/Family education, Self Care, Joint mobilization, Moist heat, Taping, and Manual therapy  PLAN FOR NEXT SESSION:  D/C she may need us  in the future as she tends to regress   Hollis Lurie, PT [place] [facility] at [facility]. [place], [place], [address] Phone: [phone]   Fax:  [phone]

## 2023-12-28 ENCOUNTER — Ambulatory Visit (INDEPENDENT_AMBULATORY_CARE_PROVIDER_SITE_OTHER)

## 2023-12-28 ENCOUNTER — Encounter: Payer: Self-pay | Admitting: Podiatry

## 2023-12-28 ENCOUNTER — Ambulatory Visit (INDEPENDENT_AMBULATORY_CARE_PROVIDER_SITE_OTHER): Admitting: Podiatry

## 2023-12-28 VITALS — Ht <= 58 in | Wt 160.0 lb

## 2023-12-28 DIAGNOSIS — M205X1 Other deformities of toe(s) (acquired), right foot: Secondary | ICD-10-CM | POA: Diagnosis not present

## 2023-12-28 DIAGNOSIS — S99921D Unspecified injury of right foot, subsequent encounter: Secondary | ICD-10-CM

## 2023-12-28 NOTE — Patient Instructions (Signed)
 More silicone pads can be purchased from:  [email or web address]  Look for gel or silicone toe caps. These can also be found on [employer]

## 2023-12-28 NOTE — Progress Notes (Unsigned)
 Chief Complaint  Patient presents with   Nail Problem    hyperextension of the right great toe x 3 weeks;routine nail care    HPI: 83 y.o. female presents today after noticing hyperextension of the right great toe over the past 3 weeks.  Patient comes from a facility.  She was working with PT as she noticed this.  She denies pain.  Medical history is noted below.  Notable for diabetes, prior CVA with partial left-sided paralysis and prediabetes, persistent atrial fibrillation, CHF.  She does take chronic Eliquis  and sees Dr. Zettie Hillock for diabetic footcare.  Past Medical History:  Diagnosis Date   Acute cystitis without hematuria    Acute diastolic CHF (congestive heart failure) (HCC)    Arthritis    Dyspnea    Dysrhythmia    Fever of unknown origin 03/19/2017   Hyperlipidemia    Hypertension    denies at preop   Multifocal pneumonia    Neuromuscular disorder (HCC)    neuropathy left arm and foot   Osteopenia    Paralysis (HCC)    partial left side from CVA    Persistent atrial fibrillation (HCC)    PONV (postoperative nausea and vomiting)    Pre-diabetes    Stroke [facility]) 2013   hemmorahgic    Past Surgical History:  Procedure Laterality Date   ANKLE SURGERY     APPENDECTOMY     CHOLECYSTECTOMY     HARDWARE REMOVAL Left 03/29/2023   Procedure: HARDWARE REMOVAL;  Surgeon: Amada Backer, MD;  Location: [facility];  Service: Orthopedics;  Laterality: Left;   HERNIA REPAIR     Esophagus   INCISION AND DRAINAGE OF WOUND Left 03/29/2023   Procedure: LEFT ANKLE WOUND IRRIGATION AND DEBRIDEMENT WOUND;  Surgeon: Amada Backer, MD;  Location:  SUR[facility]ERY [facility]ENTER;  Service: Orthopedics;  Laterality: Left;   JOINT REPLACEMENT     total- right partial- left   MASTECTOMY PARTIAL / LUMPECTOMY  2012   left   ORIF ANKLE FRACTURE Left 07/20/2018   Procedure: OPEN REDUCTION INTERNAL FIXATION (ORIF) ANKLE FRACTURE;  Surgeon: Amada Backer, MD;  Location: [facility];   Service: Orthopedics;  Laterality: Left;   TOTAL KNEE ARTHROPLASTY Left 01/27/2019   Procedure: TOTAL KNEE ARTHROPLASTY;  Surgeon: Christie Cox, MD;  [facility]: [facility];  Service: Orthopedics;  Laterality: Left;    Allergies  Allergen Reactions   Codeine Phosphate Other (See Comments)    ROS    Physical Exam: There were no vitals filed for this visit.  General: The patient is alert and oriented x3 in no acute distress.  Dermatology: Skin is warm, dry.  Atrophic pedal skin.  Vascular: Palpable DP pedal pulses bilaterally, nonpalpable PT pulse. Capillary refill within normal limits.  Lower extremities tenderness bilaterally.  Neurological: Light touch sensation grossly intact bilateral feet.   Musculoskeletal Exam: Right first toe hallux extensus noted at the interphalangeal joint.  This is reducible and nonpainful.  There is hallux rigidus present at the MPJ.  Hammertoe contractures noted.  Decreased muscle strength left lower extremity, wears brace for this.  Radiographic Exam: Right foot 3 views 12/28/2023 Decreased osseous mineralization.  Status post total ankle arthroplasty noted.  Near complete arthrosis of first MPJ noted.  There is hyperextension of the right hallux interphalangeal joint level.  Hammertoe contractures noted.  Vascular calcifications noted.  Examination somewhat limited due to patient positioning.  Assessment/Plan of Care: 1. Hallux extensus, acquired, right   2. Toe  injury, right, subsequent encounter      No orders of the defined types were placed in this encounter.  None  Discussed clinical findings with patient today.  Radiographs reviewed with patient  Right hallux nonpainful.  Did discuss conservative and more invasive treatment options.  Recommend against surgical treatment due to patient's overall medical history.  Likely etiology due to near complete arthrosis of first MPJ and extensor tendon pull on the toe subsequently.  Extensor tendon tenotomy or  tendon release could potentially cause undesired side effects to gait stability.  Recommend accommodative padding for the toe to prevent rubbing in shoe gear.  Gel toe cap dispensed today.  She can follow-up as needed for this.  Continue follow-up with Dr. Zettie Hillock for diabetic footcare.   Hasheem Voland L. Lunda Salines, AACFAS [facility]     [address] [place], [place] [address]                Office [phone]  Fax [phone]

## 2024-01-01 NOTE — Therapy (Signed)
 OUTPATIENT OCCUPATIONAL THERAPY NEURO EVALUATION  Patient Name: ONYINYECHI CLAPSADDLE MRN: [id number] DOB:1941/06/12, 83 y.o., female Today's Date: 01/03/2024  PCP: Dr. Genelle Kennedy REFERRING PROVIDER: Dr. Genelle Kennedy  END OF SESSION:  OT End of Session - 01/03/24 1138     Visit Number 1    Number of Visits 25    Date for OT Re-Evaluation 03/27/24    Authorization Type MCR and  [employer]    Authorization Time Period 12 weeks    Authorization - Visit Number 1    OT Start Time 1104    OT Stop Time 1143    OT Time Calculation (min) 39 min    Activity Tolerance Patient tolerated treatment well    Behavior During Therapy WFL for tasks assessed/performed             Past Medical History:  Diagnosis Date   Acute cystitis without hematuria    Acute diastolic CHF (congestive heart failure) (HCC)    Arthritis    Dyspnea    Dysrhythmia    Fever of unknown origin 03/19/2017   Hyperlipidemia    Hypertension    denies at preop   Multifocal pneumonia    Neuromuscular disorder (HCC)    neuropathy left arm and foot   Osteopenia    Paralysis (HCC)    partial left side from CVA    Persistent atrial fibrillation (HCC)    PONV (postoperative nausea and vomiting)    Pre-diabetes    Stroke [facility]) 2013   hemmorahgic   Past Surgical History:  Procedure Laterality Date   ANKLE SURGERY     APPENDECTOMY     CHOLECYSTECTOMY     HARDWARE REMOVAL Left 03/29/2023   Procedure: HARDWARE REMOVAL;  Surgeon: Amada Backer, MD;  Location: [facility];  Service: Orthopedics;  Laterality: Left;   HERNIA REPAIR     Esophagus   INCISION AND DRAINAGE OF WOUND Left 03/29/2023   Procedure: LEFT ANKLE WOUND IRRIGATION AND DEBRIDEMENT WOUND;  Surgeon: Amada Backer, MD;  Location: [facility];  Service: Orthopedics;  Laterality: Left;   JOINT REPLACEMENT     total- right partial- left   MASTECTOMY PARTIAL / LUMPECTOMY  2012   left   ORIF ANKLE FRACTURE Left 07/20/2018   Procedure: OPEN REDUCTION  INTERNAL FIXATION (ORIF) ANKLE FRACTURE;  Surgeon: Amada Backer, MD;  Location: [facility];  Service: Orthopedics;  Laterality: Left;   TOTAL KNEE ARTHROPLASTY Left 01/27/2019   Procedure: TOTAL KNEE ARTHROPLASTY;  Surgeon: Christie Cox, MD;  Location: [facility];  Service: Orthopedics;  Laterality: Left;   Patient Active Problem List   Diagnosis Date Noted   Goals of care, counseling/discussion 01/17/2022   Grief 01/17/2022   Secondary hypercoagulable state (HCC) 03/16/2021   Dizziness 03/10/2021   Orthostatic hypotension 10/15/2020   Presbycusis of both ears 03/08/2020   Mixed stress and urge urinary incontinence 12/02/2019   Macrocytosis 12/01/2019   Nutritional anemia 12/01/2019   S/P total knee replacement 01/27/2019   Recurrent left knee instability 07/05/2018   Respiratory failure with hypoxia (HCC) 08/30/2017   Hypoxemia    Heart failure with preserved ejection fraction (HCC), Grade 3 diastolic dysfunction 03/26/2017   PAF (paroxysmal atrial fibrillation) (HCC)    Dyspnea 03/19/2017   Encounter for preventive health examination 02/17/2016   Sensorineural hearing loss (SNHL), bilateral 01/26/2016   Hypomagnesemia 04/24/2014   Hemiparesis affecting left side as late effect of cerebrovascular accident (HCC) 04/24/2014   Nontraumatic cerebral hemorrhage (HCC) 04/30/2012   DM (diabetes  mellitus) with complications (HCC) 03/04/2010   OSTEOPENIA 01/21/2009   UNSPECIFIED VITAMIN D  DEFICIENCY 11/19/2007   HYPERCHOLESTEROLEMIA 10/25/2006   GASTROESOPHAGEAL REFLUX, NO ESOPHAGITIS 10/25/2006   DIVERTICULOSIS OF COLON 10/25/2006   Osteoarthritis 10/25/2006   CERVICAL SPINE DISORDER, NOS 10/25/2006    ONSET DATE: 12/03/23- referral date  REFERRING DIAG: I69.354 (ICD-10-CM) - Hemiplegia and hemiparesis following cerebral infarction affecting left non-dominant side   THERAPY DIAG:  Muscle weakness (generalized) - Plan: Ot plan of care cert/re-cert  Hemiplegia and hemiparesis following  cerebral infarction affecting left non-dominant side (HCC) - Plan: Ot plan of care cert/re-cert  Other lack of coordination - Plan: Ot plan of care cert/re-cert  Rationale for Evaluation and Treatment: Rehabilitation  SUBJECTIVE:   SUBJECTIVE STATEMENT: Pt reports the MD said no therapy for her shoulders Pt accompanied by: self  PERTINENT HISTORY: CVA 2013, CHF, HTN, A-fib, TKA, s/p ORIF left ankle hardware removal, s/p injection to bilateral shoulders 01/02/24  PRECAUTIONS: Fall, no therapy to bilateral shoulders per MD  WEIGHT BEARING RESTRICTIONS: No  PAIN:  Are you having pain? Yes: NPRS scale: 4/10  Pain location: bilateral shoulders Pain description: aching, sharp Aggravating factors: movement, Relieving factors: rest  FALLS: Has patient fallen in last 6 months? No  LIVING ENVIRONMENT: Lives with: has caregiver 8-2, 4:30-8pm Lives in: House/apartment Stairs: No, has ramp Has following equipment at home: Walker - 2 wheeled, shower chair, and Ramped entry  PLOF: Needs assistance with homemaking, Needs assistance with transfers, ADLS, plays bridge, she has  an aide at home that helps with meals, going to MD's, dressing and bathing, is alone at night    PATIENT GOALS: improve functional use of LUE  OBJECTIVE:  Note: Objective measures were completed at Evaluation unless otherwise noted.  HAND DOMINANCE: Right  ADLs: Overall ADLs: requires assist Transfers/ambulation related to ADLs: Eating: needs assit with cutting food Grooming: set up UB Dressing: mod A/ max  LB Dressing: max  Toileting: has grab bar, min A Bathing: min-mod A Tub Shower transfers: min-mod A to rolling shower chair, then caregivers roll you    IADLs:caregiver's perform IADLS/ home management  Medication management: pt and caregiver complete together  Financial management: nephew handles  MOBILITY STATUS: Needs Assist: Pt requires min-mod A for transfers, pt does not walk with  caregiver's  POSTURE COMMENTS:  rounded shoulders and forward head       UPPER EXTREMITY ROM:  RUE elbow wrist and hand grossly WFL, shoulder flexion limited to 50* due to arthritis  Active ROM Right eval Left eval  Shoulder flexion  45  Shoulder abduction    Shoulder adduction    Shoulder extension    Shoulder internal rotation    Shoulder external rotation    Elbow flexion  WFL  Elbow extension  WFL  Wrist flexion  40  Wrist extension  30  Wrist ulnar deviation    Wrist radial deviation    Wrist pronation  WFL  Wrist supination  80%  (Blank rows = not tested)   HAND FUNCTION: Grip strength: Right: 30 lbs; Left: 10 lbs  COORDINATION: Box/ blocks LUE 13 blocks in 1 min   SENSATION: Light touch: Impaired     MUSCLE TONE: LUE: Mild and Hypertonic  COGNITION: Overall cognitive status: Within functional limits for tasks assessed    VISION ASSESSMENT:NT, wear glasses for distance vision  TREATMENT DATE: 01/03/24- see eval  A/ROM supination pronation, wrist extension, finger thumb opposition, finger flexion/ extension for LUE min v.c 10 reps each        PATIENT EDUCATION: Education details: role of OT, potential goals Person educated: Patient Education method: Explanation Education comprehension: verbalized understanding  HOME EXERCISE PROGRAM: not formally issed   GOALS: Goals reviewed with patient? Yes  SHORT TERM GOALS: Target date: 02/03/24  I with inital HEP. Baseline: Goal status: INITIAL  2.  Pt will increase A/ROM wrist extension to 40* for improved functional use. Baseline: 30* Goal status: INITIAL  3.  Pt will increase LUE box/ blocks score to 16 blocks Baseline: 13 blocks Goal status: INITIAL  4.  Pt will increase A/ROM wrist flexion to 50* for improved functional use. Baseline:  Goal status:  INITIAL  5.  Pt will increase LUE grip strength to 15 lbs for increased functional use. Baseline: RUE 30 lbs, LUE 10 lbs. Goal status: INITIAL  6.  Pt will verbalize understanding of positioning to minimize UE pain. Goal status: INITIAL  LONG TERM GOALS: Target date: 03/27/24  I with updated HEP  Goal status: INITIAL  2.  Pt will increase LUE box/ blocks score to 16 blocks  Goal status: INITIAL  3.  Pt will use LUE as a gross assist at least 15% of the time. Baseline: uses 5% Goal status: INITIAL  4.  Pt will report decreased drops with LUE. Baseline: frequent drops Goal status: INITIAL 5. Pt will verbalize understanding of AE/DME to maximize safety/ I including possible walker splint prn.  Goal status: inital  ASSESSMENT:  CLINICAL IMPRESSION: Patient is a 83 y.o. female who was seen today for occupational therapy evaluation for I69.354 (ICD-10-CM) - Hemiplegia and hemiparesis following cerebral infarction affecting left non-dominant side Pt s/p CVA in 2013 with residual LUE weakness. Pt can benefit from skilled occupational therapy to address the performance deficits below in order to maximize pt's safety and I with ADLs/IADLs.Aaron Aas   PERFORMANCE DEFICITS: in functional skills including ADLs, IADLs, coordination, dexterity, sensation, tone, ROM, strength, flexibility, Fine motor control, Gross motor control, mobility, balance, endurance, decreased knowledge of precautions, decreased knowledge of use of DME, and UE functional use, cognitive skills and psychosocial skills including coping strategies, environmental adaptation, habits, interpersonal interactions, and routines and behaviors.   IMPAIRMENTS: are limiting patient from ADLs, IADLs, rest and sleep, play, leisure, and social participation.   CO-MORBIDITIES: may have co-morbidities  that affects occupational performance. Patient will benefit from skilled OT to address above impairments and improve overall  function.  MODIFICATION OR ASSISTANCE TO COMPLETE EVALUATION: No modification of tasks or assist necessary to complete an evaluation.  OT OCCUPATIONAL PROFILE AND HISTORY: Detailed assessment: Review of records and additional review of physical, cognitive, psychosocial history related to current functional performance.  CLINICAL DECISION MAKING: LOW - limited treatment options, no task modification necessary  REHAB POTENTIAL: Good  EVALUATION COMPLEXITY: Low    PLAN:  OT FREQUENCY: 2x/week  OT DURATION: 12 weeks  PLANNED INTERVENTIONS: 97168 OT Re-evaluation, 97535 self care/ADL training, 40981 therapeutic exercise, 97530 therapeutic activity, 97112 neuromuscular re-education, 97140 manual therapy, 97035 ultrasound, 97018 paraffin, 19147 moist heat, 97010 cryotherapy, 97034 contrast bath, 97014 electrical stimulation unattended, passive range of motion, balance training, functional mobility training, psychosocial skills training, energy conservation, coping strategies training, patient/family education, and DME and/or AE instructions  RECOMMENDED OTHER SERVICES: n/a  CONSULTED AND AGREED WITH PLAN OF CARE: Patient  PLAN FOR NEXT SESSION: formal HEP for  left hand   Acel Natzke, OT 01/03/2024, 12:18 PM

## 2024-01-02 DIAGNOSIS — M25512 Pain in left shoulder: Secondary | ICD-10-CM | POA: Diagnosis not present

## 2024-01-02 DIAGNOSIS — M25511 Pain in right shoulder: Secondary | ICD-10-CM | POA: Diagnosis not present

## 2024-01-02 DIAGNOSIS — M19012 Primary osteoarthritis, left shoulder: Secondary | ICD-10-CM | POA: Diagnosis not present

## 2024-01-02 DIAGNOSIS — M19011 Primary osteoarthritis, right shoulder: Secondary | ICD-10-CM | POA: Diagnosis not present

## 2024-01-03 ENCOUNTER — Ambulatory Visit: Admitting: Occupational Therapy

## 2024-01-03 ENCOUNTER — Encounter: Payer: Self-pay | Admitting: Occupational Therapy

## 2024-01-03 DIAGNOSIS — M25512 Pain in left shoulder: Secondary | ICD-10-CM | POA: Diagnosis not present

## 2024-01-03 DIAGNOSIS — M542 Cervicalgia: Secondary | ICD-10-CM | POA: Diagnosis not present

## 2024-01-03 DIAGNOSIS — M6281 Muscle weakness (generalized): Secondary | ICD-10-CM | POA: Diagnosis not present

## 2024-01-03 DIAGNOSIS — R262 Difficulty in walking, not elsewhere classified: Secondary | ICD-10-CM | POA: Diagnosis not present

## 2024-01-03 DIAGNOSIS — M25572 Pain in left ankle and joints of left foot: Secondary | ICD-10-CM | POA: Diagnosis not present

## 2024-01-03 DIAGNOSIS — I69354 Hemiplegia and hemiparesis following cerebral infarction affecting left non-dominant side: Secondary | ICD-10-CM

## 2024-01-03 DIAGNOSIS — R278 Other lack of coordination: Secondary | ICD-10-CM

## 2024-01-07 ENCOUNTER — Encounter: Payer: Self-pay | Admitting: Occupational Therapy

## 2024-01-07 ENCOUNTER — Ambulatory Visit: Admitting: Occupational Therapy

## 2024-01-07 DIAGNOSIS — M6281 Muscle weakness (generalized): Secondary | ICD-10-CM

## 2024-01-07 DIAGNOSIS — M542 Cervicalgia: Secondary | ICD-10-CM | POA: Diagnosis not present

## 2024-01-07 DIAGNOSIS — R278 Other lack of coordination: Secondary | ICD-10-CM

## 2024-01-07 DIAGNOSIS — I69354 Hemiplegia and hemiparesis following cerebral infarction affecting left non-dominant side: Secondary | ICD-10-CM

## 2024-01-07 DIAGNOSIS — M25572 Pain in left ankle and joints of left foot: Secondary | ICD-10-CM | POA: Diagnosis not present

## 2024-01-07 DIAGNOSIS — M25512 Pain in left shoulder: Secondary | ICD-10-CM | POA: Diagnosis not present

## 2024-01-07 DIAGNOSIS — R262 Difficulty in walking, not elsewhere classified: Secondary | ICD-10-CM | POA: Diagnosis not present

## 2024-01-07 NOTE — Patient Instructions (Signed)
 FINGER: Flexion    Rest hand on surface, thumb up. Close fingers to make a fist. _10__ reps per set, __1_ sets per day, __7_ days per week   Copyright  VHI. All rights reserved.  Thumb to Fingertips    Touch tip of right thumb to tips of each finger of same hand, making an "O" shape. Open hand between each finger touch. Repeat sequence __10__ times per session. Do __7__ sessions per week. Hand Variation: Palm up   Copyright  VHI. All rights reserved.  Finger Abduction / Adduction    Spread fingers of left hand. Then bring together. Keep fingers straight. Repeat sequence _10___ times per session. Do ___7_ sessions per week. Hand Variation: Palm up   Copyright  VHI. All rights reserved.    Practice flipping cards with your left hand, stabilize the deck with right hand  Place ball on table under palm, rotate with fingers of left hand  Pick up checkers with left hand

## 2024-01-07 NOTE — Therapy (Signed)
 OUTPATIENT OCCUPATIONAL THERAPY NEURO Treatment  Patient Name: Karen Jackson MRN: [id number] DOB:1941/01/24, 83 y.o., female Today's Date: 01/07/2024  PCP: Dr. Genelle Kennedy REFERRING PROVIDER: Dr. Genelle Kennedy  END OF SESSION:  OT End of Session - 01/07/24 1109     Visit Number 2    Number of Visits 25    Date for OT Re-Evaluation 03/27/24    Authorization Type MCR and  AARP    Authorization Time Period 12 weeks    Authorization - Visit Number 2    OT Start Time 1105    OT Stop Time 1145    OT Time Calculation (min) 40 min    Activity Tolerance Patient tolerated treatment well    Behavior During Therapy WFL for tasks assessed/performed              Past Medical History:  Diagnosis Date   Acute cystitis without hematuria    Acute diastolic CHF (congestive heart failure) (HCC)    Arthritis    Dyspnea    Dysrhythmia    Fever of unknown origin 03/19/2017   Hyperlipidemia    Hypertension    denies at preop   Multifocal pneumonia    Neuromuscular disorder (HCC)    neuropathy left arm and foot   Osteopenia    Paralysis (HCC)    partial left side from CVA    Persistent atrial fibrillation (HCC)    PONV (postoperative nausea and vomiting)    Pre-diabetes    Stroke [facility]) 2013   hemmorahgic   Past Surgical History:  Procedure Laterality Date   ANKLE SURGERY     APPENDECTOMY     CHOLECYSTECTOMY     HARDWARE REMOVAL Left 03/29/2023   Procedure: HARDWARE REMOVAL;  Surgeon: Amada Backer, MD;  Location: [facility];  Service: Orthopedics;  Laterality: Left;   HERNIA REPAIR     Esophagus   INCISION AND DRAINAGE OF WOUND Left 03/29/2023   Procedure: LEFT ANKLE WOUND IRRIGATION AND DEBRIDEMENT WOUND;  Surgeon: Amada Backer, MD;  Location: [facility];  Service: Orthopedics;  Laterality: Left;   JOINT REPLACEMENT     total- right partial- left   MASTECTOMY PARTIAL / LUMPECTOMY  2012   left   ORIF ANKLE FRACTURE Left 07/20/2018   Procedure: OPEN REDUCTION  INTERNAL FIXATION (ORIF) ANKLE FRACTURE;  Surgeon: Amada Backer, MD;  Location: [facility];  Service: Orthopedics;  Laterality: Left;   TOTAL KNEE ARTHROPLASTY Left 01/27/2019   Procedure: TOTAL KNEE ARTHROPLASTY;  Surgeon: Christie Cox, MD;  Location: [facility];  Service: Orthopedics;  Laterality: Left;   Patient Active Problem List   Diagnosis Date Noted   Goals of care, counseling/discussion 01/17/2022   Grief 01/17/2022   Secondary hypercoagulable state (HCC) 03/16/2021   Dizziness 03/10/2021   Orthostatic hypotension 10/15/2020   Presbycusis of both ears 03/08/2020   Mixed stress and urge urinary incontinence 12/02/2019   Macrocytosis 12/01/2019   Nutritional anemia 12/01/2019   S/P total knee replacement 01/27/2019   Recurrent left knee instability 07/05/2018   Respiratory failure with hypoxia (HCC) 08/30/2017   Hypoxemia    Heart failure with preserved ejection fraction (HCC), Grade 3 diastolic dysfunction 03/26/2017   PAF (paroxysmal atrial fibrillation) (HCC)    Dyspnea 03/19/2017   Encounter for preventive health examination 02/17/2016   Sensorineural hearing loss (SNHL), bilateral 01/26/2016   Hypomagnesemia 04/24/2014   Hemiparesis affecting left side as late effect of cerebrovascular accident (HCC) 04/24/2014   Nontraumatic cerebral hemorrhage (HCC) 04/30/2012   DM (  diabetes mellitus) with complications (HCC) 03/04/2010   OSTEOPENIA 01/21/2009   UNSPECIFIED VITAMIN D  DEFICIENCY 11/19/2007   HYPERCHOLESTEROLEMIA 10/25/2006   GASTROESOPHAGEAL REFLUX, NO ESOPHAGITIS 10/25/2006   DIVERTICULOSIS OF COLON 10/25/2006   Osteoarthritis 10/25/2006   CERVICAL SPINE DISORDER, NOS 10/25/2006    ONSET DATE: 12/03/23- referral date  REFERRING DIAG: I69.354 (ICD-10-CM) - Hemiplegia and hemiparesis following cerebral infarction affecting left non-dominant side   THERAPY DIAG:  Muscle weakness (generalized)  Hemiplegia and hemiparesis following cerebral infarction affecting left  non-dominant side (HCC)  Other lack of coordination  Rationale for Evaluation and Treatment: Rehabilitation  SUBJECTIVE:   SUBJECTIVE STATEMENT: Pt reports shoulder and wrist pain today Pt accompanied by: self  PERTINENT HISTORY: CVA 2013, CHF, HTN, A-fib, TKA, s/p ORIF left ankle hardware removal, s/p injection to bilateral shoulders 01/02/24  PRECAUTIONS: Fall, no therapy to bilateral shoulders per MD  WEIGHT BEARING RESTRICTIONS: No  PAIN:  Are you having pain? Yes: NPRS scale: 5/10  Pain location: bilateral shoulders, left wrist Pain description: aching, sharp Aggravating factors: movement, Relieving factors: rest  FALLS: Has patient fallen in last 6 months? No  LIVING ENVIRONMENT: Lives with: has caregiver 8-2, 4:30-8pm Lives in: House/apartment Stairs: No, has ramp Has following equipment at home: Walker - 2 wheeled, shower chair, and Ramped entry  PLOF: Needs assistance with homemaking, Needs assistance with transfers, ADLS, plays bridge, she has  an aide at home that helps with meals, going to MD's, dressing and bathing, is alone at night    PATIENT GOALS: improve functional use of LUE  OBJECTIVE:  Note: Objective measures were completed at Evaluation unless otherwise noted.  HAND DOMINANCE: Right  ADLs: Overall ADLs: requires assist Transfers/ambulation related to ADLs: Eating: needs assit with cutting food Grooming: set up UB Dressing: mod A/ max  LB Dressing: max  Toileting: has grab bar, min A Bathing: min-mod A Tub Shower transfers: min-mod A to rolling shower chair, then caregivers roll you    IADLs:caregiver's perform IADLS/ home management  Medication management: pt and caregiver complete together  Financial management: nephew handles  MOBILITY STATUS: Needs Assist: Pt requires min-mod A for transfers, pt does not walk with caregiver's  POSTURE COMMENTS:  rounded shoulders and forward head       UPPER EXTREMITY ROM:  RUE elbow wrist  and hand grossly WFL, shoulder flexion limited to 50* due to arthritis  Active ROM Right eval Left eval  Shoulder flexion  45  Shoulder abduction    Shoulder adduction    Shoulder extension    Shoulder internal rotation    Shoulder external rotation    Elbow flexion  WFL  Elbow extension  WFL  Wrist flexion  40  Wrist extension  30  Wrist ulnar deviation    Wrist radial deviation    Wrist pronation  WFL  Wrist supination  80%  (Blank rows = not tested)   HAND FUNCTION: Grip strength: Right: 30 lbs; Left: 10 lbs  COORDINATION: Box/ blocks LUE 13 blocks in 1 min   SENSATION: Light touch: Impaired     MUSCLE TONE: LUE: Mild and Hypertonic  COGNITION: Overall cognitive status: Within functional limits for tasks assessed    VISION ASSESSMENT:NT, wear glasses for distance vision  TREATMENT DATE:01/07/24-Pt was instructed in HEP, min-mod v.c and facilitation to avoid compensatory shoulder hike. Functional grsap/ release of wooden doewl pegs, min facilitation, min-mod v.c to avoid compensation and shoulder hike.   01/03/24- see eval  A/ROM supination pronation, wrist extension, finger thumb opposition, finger flexion/ extension for LUE min v.c 10 reps each       PATIENT EDUCATION: Education details:  initial HEP Person educated: Patient Education method: Programmer, multimedia, Demonstration, Verbal cues, and Handouts Education comprehension: verbalized understanding, returned demonstration, verbal cues required, and tactile cues required   HOME EXERCISE PROGRAM: 01/07/24 A/ROM, light functional use of LUE, cards, checkers   GOALS: Goals reviewed with patient? Yes  SHORT TERM GOALS: Target date: 02/03/24  I with inital HEP. Baseline: Goal status: INITIAL  2.  Pt will increase A/ROM wrist extension to 40* for improved functional  use. Baseline: 30* Goal status: INITIAL  3.  Pt will increase LUE box/ blocks score to 16 blocks Baseline: 13 blocks Goal status: INITIAL  4.  Pt will increase A/ROM wrist flexion to 50* for improved functional use. Baseline:  Goal status: INITIAL  5.  Pt will increase LUE grip strength to 15 lbs for increased functional use. Baseline: RUE 30 lbs, LUE 10 lbs. Goal status: INITIAL  6.  Pt will verbalize understanding of positioning to minimize UE pain. Goal status: INITIAL  LONG TERM GOALS: Target date: 03/27/24  I with updated HEP  Goal status: INITIAL  2.  Pt will increase LUE box/ blocks score to 16 blocks  Goal status: INITIAL  3.  Pt will use LUE as a gross assist at least 15% of the time. Baseline: uses 5% Goal status: INITIAL  4.  Pt will report decreased drops with LUE. Baseline: frequent drops Goal status: INITIAL 5. Pt will verbalize understanding of AE/DME to maximize safety/ I including possible walker splint prn.  Goal status: inital  ASSESSMENT:  CLINICAL IMPRESSION: Patient is progressing towards goals. she demosntrates understanding of inital HEP. She remains limited by UE pain and limitiations in shoulder ROM.PERFORMANCE DEFICITS: in functional skills including ADLs, IADLs, coordination, dexterity, sensation, tone, ROM, strength, flexibility, Fine motor control, Gross motor control, mobility, balance, endurance, decreased knowledge of precautions, decreased knowledge of use of DME, and UE functional use, cognitive skills and psychosocial skills including coping strategies, environmental adaptation, habits, interpersonal interactions, and routines and behaviors.   IMPAIRMENTS: are limiting patient from ADLs, IADLs, rest and sleep, play, leisure, and social participation.   CO-MORBIDITIES: may have co-morbidities  that affects occupational performance. Patient will benefit from skilled OT to address above impairments and improve overall  function.  MODIFICATION OR ASSISTANCE TO COMPLETE EVALUATION: No modification of tasks or assist necessary to complete an evaluation.  OT OCCUPATIONAL PROFILE AND HISTORY: Detailed assessment: Review of records and additional review of physical, cognitive, psychosocial history related to current functional performance.  CLINICAL DECISION MAKING: LOW - limited treatment options, no task modification necessary  REHAB POTENTIAL: Good  EVALUATION COMPLEXITY: Low    PLAN:  OT FREQUENCY: 2x/week  OT DURATION: 12 weeks  PLANNED INTERVENTIONS: 97168 OT Re-evaluation, 97535 self care/ADL training, 16109 therapeutic exercise, 97530 therapeutic activity, 97112 neuromuscular re-education, 97140 manual therapy, 97035 ultrasound, 97018 paraffin, 60454 moist heat, 97010 cryotherapy, 97034 contrast bath, 97014 electrical stimulation unattended, passive range of motion, balance training, functional mobility training, psychosocial skills training, energy conservation, coping strategies training, patient/family education, and DME and/or AE instructions  RECOMMENDED OTHER SERVICES: n/a  CONSULTED AND AGREED WITH PLAN OF CARE:  Patient  PLAN FOR NEXT SESSION:  functional use of LUE   Marlenne Ridge, OT 01/07/2024, 1:00 PM

## 2024-01-08 ENCOUNTER — Ambulatory Visit: Admitting: Occupational Therapy

## 2024-01-09 ENCOUNTER — Ambulatory Visit (INDEPENDENT_AMBULATORY_CARE_PROVIDER_SITE_OTHER): Payer: Medicare Other | Admitting: Podiatry

## 2024-01-09 ENCOUNTER — Encounter: Payer: Self-pay | Admitting: Podiatry

## 2024-01-09 DIAGNOSIS — E118 Type 2 diabetes mellitus with unspecified complications: Secondary | ICD-10-CM | POA: Diagnosis not present

## 2024-01-09 DIAGNOSIS — B351 Tinea unguium: Secondary | ICD-10-CM | POA: Diagnosis not present

## 2024-01-09 DIAGNOSIS — M79609 Pain in unspecified limb: Secondary | ICD-10-CM | POA: Diagnosis not present

## 2024-01-09 NOTE — Progress Notes (Signed)
This patient returns to my office for at risk foot care.  This patient requires this care by a professional since this patient will be at risk due to having diabetes.  This patient is unable to cut nails herself since the patient cannot reach her nails.These nails are painful walking and wearing shoes. Patient has coagulation defect due to taking eliquis. Patient presents to the office with her caregiver and in a wheelchair. This patient presents for at risk foot care today.  General Appearance  Alert, conversant and in no acute stress.  Vascular  Dorsalis pedis and posterior tibial  pulses are palpable  bilaterally.  Capillary return is within normal limits  bilaterally. Temperature is within normal limits  bilaterally.  Neurologic  Senn-Weinstein monofilament wire test within normal limits  bilaterally. Muscle power within normal limits bilaterally.  Nails Thick disfigured discolored nails with subungual debris  from hallux to fifth toes bilaterally. No evidence of bacterial infection or drainage bilaterally.  Orthopedic  No limitations of motion  feet .  No crepitus or effusions noted.  No bony pathology or digital deformities noted.  Skin  normotropic skin with no porokeratosis noted bilaterally.  No signs of infections or ulcers noted.     Onychomycosis  Pain in right toes  Pain in left toes  Consent was obtained for treatment procedures.   Mechanical debridement of nails 1-5  bilaterally performed with a nail nipper.  Filed with dremel without incident.    Return office visit  4   months                   Told patient to return for periodic foot care and evaluation due to potential at risk complications.   Helane Gunther DPM

## 2024-01-10 ENCOUNTER — Ambulatory Visit: Admitting: Occupational Therapy

## 2024-01-10 DIAGNOSIS — M25512 Pain in left shoulder: Secondary | ICD-10-CM | POA: Diagnosis not present

## 2024-01-10 DIAGNOSIS — I69354 Hemiplegia and hemiparesis following cerebral infarction affecting left non-dominant side: Secondary | ICD-10-CM

## 2024-01-10 DIAGNOSIS — M6281 Muscle weakness (generalized): Secondary | ICD-10-CM

## 2024-01-10 DIAGNOSIS — M542 Cervicalgia: Secondary | ICD-10-CM | POA: Diagnosis not present

## 2024-01-10 DIAGNOSIS — R262 Difficulty in walking, not elsewhere classified: Secondary | ICD-10-CM | POA: Diagnosis not present

## 2024-01-10 DIAGNOSIS — R293 Abnormal posture: Secondary | ICD-10-CM

## 2024-01-10 DIAGNOSIS — R278 Other lack of coordination: Secondary | ICD-10-CM

## 2024-01-10 DIAGNOSIS — M25572 Pain in left ankle and joints of left foot: Secondary | ICD-10-CM | POA: Diagnosis not present

## 2024-01-10 NOTE — Therapy (Signed)
 OUTPATIENT OCCUPATIONAL THERAPY NEURO Treatment  Patient Name: Karen Jackson MRN: [id number] DOB:[birth year], 83 y.o., female Today's Date: 01/10/2024  PCP: Dr. Genelle Kennedy REFERRING PROVIDER: Dr. Genelle Kennedy  END OF SESSION:  OT End of Session - 01/10/24 1214     Visit Number 3    Number of Visits 25    Date for OT Re-Evaluation 03/27/24    Authorization Type MCR and  [employer]    Authorization Time Period 12 weeks    Authorization - Visit Number 3    OT Start Time 1102    OT Stop Time 1145    OT Time Calculation (min) 43 min               Past Medical History:  Diagnosis Date   Acute cystitis without hematuria    Acute diastolic CHF (congestive heart failure) (HCC)    Arthritis    Dyspnea    Dysrhythmia    Fever of unknown origin 03/19/2017   Hyperlipidemia    Hypertension    denies at preop   Multifocal pneumonia    Neuromuscular disorder (HCC)    neuropathy left arm and foot   Osteopenia    Paralysis (HCC)    partial left side from CVA    Persistent atrial fibrillation (HCC)    PONV (postoperative nausea and vomiting)    Pre-diabetes    Stroke [facility]) 2013   hemmorahgic   Past Surgical History:  Procedure Laterality Date   ANKLE SURGERY     APPENDECTOMY     CHOLECYSTECTOMY     HARDWARE REMOVAL Left 03/29/2023   Procedure: HARDWARE REMOVAL;  Surgeon: Amada Backer, MD;  Location: [facility];  Service: Orthopedics;  Laterality: Left;   HERNIA REPAIR     Esophagus   INCISION AND DRAINAGE OF WOUND Left 03/29/2023   Procedure: LEFT ANKLE WOUND IRRIGATION AND DEBRIDEMENT WOUND;  Surgeon: Amada Backer, MD;  Location: [facility];  Service: Orthopedics;  Laterality: Left;   JOINT REPLACEMENT     total- right partial- left   MASTECTOMY PARTIAL / LUMPECTOMY  2012   left   ORIF ANKLE FRACTURE Left 07/20/2018   Procedure: OPEN REDUCTION INTERNAL FIXATION (ORIF) ANKLE FRACTURE;  Surgeon: Amada Backer, MD;  Location: [facility];  Service: Orthopedics;   Laterality: Left;   TOTAL KNEE ARTHROPLASTY Left 01/27/2019   Procedure: TOTAL KNEE ARTHROPLASTY;  Surgeon: Christie Cox, MD;  Location: [facility];  Service: Orthopedics;  Laterality: Left;   Patient Active Problem List   Diagnosis Date Noted   Goals of care, counseling/discussion 01/17/2022   Grief 01/17/2022   Secondary hypercoagulable state (HCC) 03/16/2021   Dizziness 03/10/2021   Orthostatic hypotension 10/15/2020   Presbycusis of both ears 03/08/2020   Mixed stress and urge urinary incontinence 12/02/2019   Macrocytosis 12/01/2019   Nutritional anemia 12/01/2019   S/P total knee replacement 01/27/2019   Recurrent left knee instability 07/05/2018   Respiratory failure with hypoxia (HCC) 08/30/2017   Hypoxemia    Heart failure with preserved ejection fraction (HCC), Grade 3 diastolic dysfunction 03/26/2017   PAF (paroxysmal atrial fibrillation) (HCC)    Dyspnea 03/19/2017   Encounter for preventive health examination 02/17/2016   Sensorineural hearing loss (SNHL), bilateral 01/26/2016   Hypomagnesemia 04/24/2014   Hemiparesis affecting left side as late effect of cerebrovascular accident (HCC) 04/24/2014   Nontraumatic cerebral hemorrhage (HCC) 04/30/2012   DM (diabetes mellitus) with complications (HCC) 03/04/2010   OSTEOPENIA 01/21/2009   UNSPECIFIED VITAMIN D  DEFICIENCY 11/19/2007  HYPERCHOLESTEROLEMIA 10/25/2006   GASTROESOPHAGEAL REFLUX, NO ESOPHAGITIS 10/25/2006   DIVERTICULOSIS OF COLON 10/25/2006   Osteoarthritis 10/25/2006   CERVICAL SPINE DISORDER, NOS 10/25/2006    ONSET DATE: 12/03/23- referral date  REFERRING DIAG: I69.354 (ICD-10-CM) - Hemiplegia and hemiparesis following cerebral infarction affecting left non-dominant side   THERAPY DIAG:  Muscle weakness (generalized)  Hemiplegia and hemiparesis following cerebral infarction affecting left non-dominant side (HCC)  Other lack of coordination  Abnormal posture  Rationale for Evaluation and Treatment:  Rehabilitation  SUBJECTIVE:   SUBJECTIVE STATEMENT: Pt reports shoulder /neck pain Pt accompanied by: self  PERTINENT HISTORY: CVA 2013, CHF, HTN, A-fib, TKA, s/p ORIF left ankle hardware removal, s/p injection to bilateral shoulders 01/02/24  PRECAUTIONS: Fall, no therapy to bilateral shoulders per MD  WEIGHT BEARING RESTRICTIONS: No  PAIN:  Are you having pain? Yes: NPRS scale: 5/10  Pain location: bilateral shoulders, left wrist Pain description: aching, sharp Aggravating factors: movement, Relieving factors: rest  FALLS: Has patient fallen in last 6 months? No  LIVING ENVIRONMENT: Lives with: has caregiver 8-2, 4:30-8pm Lives in: House/apartment Stairs: No, has ramp Has following equipment at home: Walker - 2 wheeled, shower chair, and Ramped entry  PLOF: Needs assistance with homemaking, Needs assistance with transfers, ADLS, plays bridge, she has  an aide at home that helps with meals, going to MD's, dressing and bathing, is alone at night    PATIENT GOALS: improve functional use of LUE  OBJECTIVE:  Note: Objective measures were completed at Evaluation unless otherwise noted.  HAND DOMINANCE: Right  ADLs: Overall ADLs: requires assist Transfers/ambulation related to ADLs: Eating: needs assit with cutting food Grooming: set up UB Dressing: mod A/ max  LB Dressing: max  Toileting: has grab bar, min A Bathing: min-mod A Tub Shower transfers: min-mod A to rolling shower chair, then caregivers roll you    IADLs:caregiver's perform IADLS/ home management  Medication management: pt and caregiver complete together  Financial management: nephew handles  MOBILITY STATUS: Needs Assist: Pt requires min-mod A for transfers, pt does not walk with caregiver's  POSTURE COMMENTS:  rounded shoulders and forward head       UPPER EXTREMITY ROM:  RUE elbow wrist and hand grossly WFL, shoulder flexion limited to 50* due to arthritis  Active ROM Right eval Left eval   Shoulder flexion  45  Shoulder abduction    Shoulder adduction    Shoulder extension    Shoulder internal rotation    Shoulder external rotation    Elbow flexion  WFL  Elbow extension  WFL  Wrist flexion  40  Wrist extension  30  Wrist ulnar deviation    Wrist radial deviation    Wrist pronation  WFL  Wrist supination  80%  (Blank rows = not tested)   HAND FUNCTION: Grip strength: Right: 30 lbs; Left: 10 lbs  COORDINATION: Box/ blocks LUE 13 blocks in 1 min   SENSATION: Light touch: Impaired     MUSCLE TONE: LUE: Mild and Hypertonic  COGNITION: Overall cognitive status: Within functional limits for tasks assessed    VISION ASSESSMENT:NT, wear glasses for distance vision  TREATMENT DATE:5/15/25Paraffin to L hand and wrist, while hotpack applied to shoulders and neck x 11 mins, no adverse reactions.  Standing at sink to remove pegs from pegboard with RUE, min facillitation/ assist for standing. Pt transitioned to sitting to remove and replace wooden dowel pegs in pegboard, focus on LUE control, min-mod difficulty/ v.c Flipping playing cards on a lower surface on lap to minimize shoulder pain, improved performance, min difficulty.   01/07/24-Pt was instructed in HEP, min-mod v.c and facilitation to avoid compensatory shoulder hike. Functional grsap/ release of wooden doewl pegs, min facilitation, min-mod v.c to avoid compensation and shoulder hike.   01/03/24- see eval  A/ROM supination pronation, wrist extension, finger thumb opposition, finger flexion/ extension for LUE min v.c 10 reps each       PATIENT EDUCATION: Education details:  see above Person educated: Patient Education method: Programmer, multimedia, Demonstration, Verbal cues,  Education comprehension: verbalized understanding, returned demonstration, verbal cues required, and  tactile cues required   HOME EXERCISE PROGRAM: 01/07/24 A/ROM, light functional use of LUE, cards, checkers   GOALS: Goals reviewed with patient? Yes  SHORT TERM GOALS: Target date: 02/03/24  I with inital HEP.  Goal status: ongoing, issued needs reinforcement 01/10/23  2.  Pt will increase A/ROM wrist extension to 40* for improved functional use. Baseline: 30* Goal status: ongoing  3.  Pt will increase LUE box/ blocks score to 16 blocks Baseline: 13 blocks Goal status: ongoing  4.  Pt will increase A/ROM wrist flexion to 50* for improved functional use. Baseline:  Goal status: INITIAL  5.  Pt will increase LUE grip strength to 15 lbs for increased functional use. Baseline: RUE 30 lbs, LUE 10 lbs. Goal status: INITIAL  6.  Pt will verbalize understanding of positioning to minimize UE pain. Goal status: INITIAL  LONG TERM GOALS: Target date: 03/27/24  I with updated HEP  Goal status: INITIAL  2.  Pt will increase LUE box/ blocks score to 16 blocks  Goal status: INITIAL  3.  Pt will use LUE as a gross assist at least 15% of the time. Baseline: uses 5% Goal status: INITIAL  4.  Pt will report decreased drops with LUE. Baseline: frequent drops Goal status: INITIAL 5. Pt will verbalize understanding of AE/DME to maximize safety/ I including possible walker splint prn.  Goal status: inital  ASSESSMENT:  CLINICAL IMPRESSION: Patient is progressing towards goals. She reports improved pain after heat and paraffin. Pt demonstrates improved fine motor coordination, flipping cards on lower surface in her lap.PERFORMANCE DEFICITS: in functional skills including ADLs, IADLs, coordination, dexterity, sensation, tone, ROM, strength, flexibility, Fine motor control, Gross motor control, mobility, balance, endurance, decreased knowledge of precautions, decreased knowledge of use of DME, and UE functional use, cognitive skills and psychosocial skills including coping strategies,  environmental adaptation, habits, interpersonal interactions, and routines and behaviors.   IMPAIRMENTS: are limiting patient from ADLs, IADLs, rest and sleep, play, leisure, and social participation.   CO-MORBIDITIES: may have co-morbidities  that affects occupational performance. Patient will benefit from skilled OT to address above impairments and improve overall function.  MODIFICATION OR ASSISTANCE TO COMPLETE EVALUATION: No modification of tasks or assist necessary to complete an evaluation.  OT OCCUPATIONAL PROFILE AND HISTORY: Detailed assessment: Review of records and additional review of physical, cognitive, psychosocial history related to current functional performance.  CLINICAL DECISION MAKING: LOW - limited treatment options, no task modification necessary  REHAB POTENTIAL: Good  EVALUATION COMPLEXITY: Low    PLAN:  OT  FREQUENCY: 2x/week  OT DURATION: 12 weeks  PLANNED INTERVENTIONS: 97168 OT Re-evaluation, 97535 self care/ADL training, 16109 therapeutic exercise, 97530 therapeutic activity, 97112 neuromuscular re-education, 97140 manual therapy, 97035 ultrasound, 97018 paraffin, 60454 moist heat, 97010 cryotherapy, 97034 contrast bath, 97014 electrical stimulation unattended, passive range of motion, balance training, functional mobility training, psychosocial skills training, energy conservation, coping strategies training, patient/family education, and DME and/or AE instructions  RECOMMENDED OTHER SERVICES: n/a  CONSULTED AND AGREED WITH PLAN OF CARE: Patient  PLAN FOR NEXT SESSION:  functional use of LUE, standing with LUE use   Shanya Ferriss, OT 01/10/2024, 12:16 PM

## 2024-01-15 ENCOUNTER — Ambulatory Visit: Admitting: Occupational Therapy

## 2024-01-15 ENCOUNTER — Other Ambulatory Visit: Payer: Self-pay

## 2024-01-15 ENCOUNTER — Encounter: Payer: Self-pay | Admitting: Occupational Therapy

## 2024-01-15 DIAGNOSIS — M542 Cervicalgia: Secondary | ICD-10-CM | POA: Diagnosis not present

## 2024-01-15 DIAGNOSIS — M6281 Muscle weakness (generalized): Secondary | ICD-10-CM

## 2024-01-15 DIAGNOSIS — I69354 Hemiplegia and hemiparesis following cerebral infarction affecting left non-dominant side: Secondary | ICD-10-CM | POA: Diagnosis not present

## 2024-01-15 DIAGNOSIS — R278 Other lack of coordination: Secondary | ICD-10-CM

## 2024-01-15 DIAGNOSIS — M25512 Pain in left shoulder: Secondary | ICD-10-CM | POA: Diagnosis not present

## 2024-01-15 DIAGNOSIS — R262 Difficulty in walking, not elsewhere classified: Secondary | ICD-10-CM | POA: Diagnosis not present

## 2024-01-15 DIAGNOSIS — M25572 Pain in left ankle and joints of left foot: Secondary | ICD-10-CM | POA: Diagnosis not present

## 2024-01-15 DIAGNOSIS — R293 Abnormal posture: Secondary | ICD-10-CM

## 2024-01-15 NOTE — Patient Instructions (Signed)
1. Grip Strengthening (Resistive Putty)   Squeeze putty using thumb and all fingers. Repeat _20___ times. Do __2__ sessions per day.   2. Roll putty into tube on table and pinch between first two fingers and thumb x 10 reps. Do 2 sessions per day     Copyright  VHI. All rights reserved.     

## 2024-01-15 NOTE — Therapy (Signed)
 OUTPATIENT OCCUPATIONAL THERAPY NEURO Treatment  Patient Name: Karen Jackson MRN: [id number] DOB:1941/02/05, 83 y.o., female Today's Date: 01/15/2024  PCP: Dr. Genelle Kennedy REFERRING PROVIDER: Dr. Genelle Kennedy  END OF SESSION:  OT End of Session - 01/15/24 1331     Visit Number 4    Date for OT Re-Evaluation 03/27/24    Authorization Type MCR and  AARP    Authorization Time Period 12 weeks    Authorization - Visit Number 4    OT Start Time 1103    OT Stop Time 1143    OT Time Calculation (min) 40 min                Past Medical History:  Diagnosis Date   Acute cystitis without hematuria    Acute diastolic CHF (congestive heart failure) (HCC)    Arthritis    Dyspnea    Dysrhythmia    Fever of unknown origin 03/19/2017   Hyperlipidemia    Hypertension    denies at preop   Multifocal pneumonia    Neuromuscular disorder (HCC)    neuropathy left arm and foot   Osteopenia    Paralysis (HCC)    partial left side from CVA    Persistent atrial fibrillation (HCC)    PONV (postoperative nausea and vomiting)    Pre-diabetes    Stroke [facility]) 2013   hemmorahgic   Past Surgical History:  Procedure Laterality Date   ANKLE SURGERY     APPENDECTOMY     CHOLECYSTECTOMY     HARDWARE REMOVAL Left 03/29/2023   Procedure: HARDWARE REMOVAL;  Surgeon: Amada Backer, MD;  Location: [facility];  Service: Orthopedics;  Laterality: Left;   HERNIA REPAIR     Esophagus   INCISION AND DRAINAGE OF WOUND Left 03/29/2023   Procedure: LEFT ANKLE WOUND IRRIGATION AND DEBRIDEMENT WOUND;  Surgeon: Amada Backer, MD;  Location: [facility];  Service: Orthopedics;  Laterality: Left;   JOINT REPLACEMENT     total- right partial- left   MASTECTOMY PARTIAL / LUMPECTOMY  2012   left   ORIF ANKLE FRACTURE Left 07/20/2018   Procedure: OPEN REDUCTION INTERNAL FIXATION (ORIF) ANKLE FRACTURE;  Surgeon: Amada Backer, MD;  Location: [facility];  Service: Orthopedics;  Laterality: Left;   TOTAL  KNEE ARTHROPLASTY Left 01/27/2019   Procedure: TOTAL KNEE ARTHROPLASTY;  Surgeon: Christie Cox, MD;  Location: [facility];  Service: Orthopedics;  Laterality: Left;   Patient Active Problem List   Diagnosis Date Noted   Goals of care, counseling/discussion 01/17/2022   Grief 01/17/2022   Secondary hypercoagulable state (HCC) 03/16/2021   Dizziness 03/10/2021   Orthostatic hypotension 10/15/2020   Presbycusis of both ears 03/08/2020   Mixed stress and urge urinary incontinence 12/02/2019   Macrocytosis 12/01/2019   Nutritional anemia 12/01/2019   S/P total knee replacement 01/27/2019   Recurrent left knee instability 07/05/2018   Respiratory failure with hypoxia (HCC) 08/30/2017   Hypoxemia    Heart failure with preserved ejection fraction (HCC), Grade 3 diastolic dysfunction 03/26/2017   PAF (paroxysmal atrial fibrillation) (HCC)    Dyspnea 03/19/2017   Encounter for preventive health examination 02/17/2016   Sensorineural hearing loss (SNHL), bilateral 01/26/2016   Hypomagnesemia 04/24/2014   Hemiparesis affecting left side as late effect of cerebrovascular accident (HCC) 04/24/2014   Nontraumatic cerebral hemorrhage (HCC) 04/30/2012   DM (diabetes mellitus) with complications (HCC) 03/04/2010   OSTEOPENIA 01/21/2009   UNSPECIFIED VITAMIN D  DEFICIENCY 11/19/2007   HYPERCHOLESTEROLEMIA 10/25/2006   GASTROESOPHAGEAL  REFLUX, NO ESOPHAGITIS 10/25/2006   DIVERTICULOSIS OF COLON 10/25/2006   Osteoarthritis 10/25/2006   CERVICAL SPINE DISORDER, NOS 10/25/2006    ONSET DATE: 12/03/23- referral date  REFERRING DIAG: I69.354 (ICD-10-CM) - Hemiplegia and hemiparesis following cerebral infarction affecting left non-dominant side   THERAPY DIAG:  Muscle weakness (generalized)  Hemiplegia and hemiparesis following cerebral infarction affecting left non-dominant side (HCC)  Other lack of coordination  Abnormal posture  Rationale for Evaluation and Treatment: Rehabilitation  SUBJECTIVE:    SUBJECTIVE STATEMENT: Pt reports she went to lunch yesterday Pt accompanied by: self  PERTINENT HISTORY: CVA 2013, CHF, HTN, A-fib, TKA, s/p ORIF left ankle hardware removal, s/p injection to bilateral shoulders 01/02/24  PRECAUTIONS: Fall, no therapy to bilateral shoulders per MD  WEIGHT BEARING RESTRICTIONS: No  PAIN:  Are you having pain? Yes: NPRS scale: 5/10 hand, shoulder 7/10 Pain location: bilateral shoulders, left wrist Pain description: aching, sharp Aggravating factors: movement, Relieving factors: rest  FALLS: Has patient fallen in last 6 months? No  LIVING ENVIRONMENT: Lives with: has caregiver 8-2, 4:30-8pm Lives in: House/apartment Stairs: No, has ramp Has following equipment at home: Walker - 2 wheeled, shower chair, and Ramped entry  PLOF: Needs assistance with homemaking, Needs assistance with transfers, ADLS, plays bridge, she has  an aide at home that helps with meals, going to MD's, dressing and bathing, is alone at night    PATIENT GOALS: improve functional use of LUE  OBJECTIVE:  Note: Objective measures were completed at Evaluation unless otherwise noted.  HAND DOMINANCE: Right  ADLs: Overall ADLs: requires assist Transfers/ambulation related to ADLs: Eating: needs assit with cutting food Grooming: set up UB Dressing: mod A/ max  LB Dressing: max  Toileting: has grab bar, min A Bathing: min-mod A Tub Shower transfers: min-mod A to rolling shower chair, then caregivers roll you    IADLs:caregiver's perform IADLS/ home management  Medication management: pt and caregiver complete together  Financial management: nephew handles  MOBILITY STATUS: Needs Assist: Pt requires min-mod A for transfers, pt does not walk with caregiver's  POSTURE COMMENTS:  rounded shoulders and forward head       UPPER EXTREMITY ROM:  RUE elbow wrist and hand grossly WFL, shoulder flexion limited to 50* due to arthritis  Active ROM Right eval Left eval   Shoulder flexion  45  Shoulder abduction    Shoulder adduction    Shoulder extension    Shoulder internal rotation    Shoulder external rotation    Elbow flexion  WFL  Elbow extension  WFL  Wrist flexion  40  Wrist extension  30  Wrist ulnar deviation    Wrist radial deviation    Wrist pronation  WFL  Wrist supination  80%  (Blank rows = not tested)   HAND FUNCTION: Grip strength: Right: 30 lbs; Left: 10 lbs  COORDINATION: Box/ blocks LUE 13 blocks in 1 min   SENSATION: Light touch: Impaired     MUSCLE TONE: LUE: Mild and Hypertonic  COGNITION: Overall cognitive status: Within functional limits for tasks assessed    VISION ASSESSMENT:NT, wear glasses for distance vision  TREATMENT DATE:5/20/25Paraffin to L hand and wrist, while hotpack applied to  left shoulder  x 10 mins, mild pink coloration to left shoulder that diminished as therapy session continued.  Flipping playing cards, then picking up checkers with L hand on low surface in pt's lap, min to mod difficulty, min v.c Pt with improving control. Removing and replacing wooden dowel pegs into pegboard with LUE, mod difficulty/ v.c Yellow putty exercises for sustained grip and pinch, min difficulty/ v.c  01/10/24 Paraffin to L hand and wrist, while hotpack applied to shoulders and neck x 11 mins, no adverse reactions.  Standing at sink to remove pegs from pegboard with RUE, min facillitation/ assist for standing. Pt transitioned to sitting to remove and replace wooden dowel pegs in pegboard, focus on LUE control, min-mod difficulty/ v.c Flipping playing cards on a lower surface on lap to minimize shoulder pain, improved performance, min difficulty.   01/07/24-Pt was instructed in HEP, min-mod v.c and facilitation to avoid compensatory shoulder hike. Functional grsap/ release of  wooden dowel pegs, min facilitation, min-mod v.c to avoid compensation and shoulder hike.   01/03/24- see eval  A/ROM supination pronation, wrist extension, finger thumb opposition, finger flexion/ extension for LUE min v.c 10 reps each       PATIENT EDUCATION: Education details:  yellow putty exercises Person educated: Patient Education method: Explanation, Demonstration, Verbal cues,  handout Education comprehension: verbalized understanding, returned demonstration, verbal cues required, and tactile cues required   HOME EXERCISE PROGRAM: 01/07/24 A/ROM, light functional use of LUE, cards, checkers   GOALS: Goals reviewed with patient? Yes  SHORT TERM GOALS: Target date: 02/03/24  I with inital HEP.  Goal status: ongoing, issued needs reinforcement 01/10/23  2.  Pt will increase A/ROM wrist extension to 40* for improved functional use. Baseline: 30* Goal status: ongoing 01/15/24  3.  Pt will increase LUE box/ blocks score to 16 blocks Baseline: 13 blocks Goal status: ongoing 01/15/24  4.  Pt will increase A/ROM wrist flexion to 50* for improved functional use.  Goal status ongoing 01/15/24  5.  Pt will increase LUE grip strength to 15 lbs for increased functional use. Baseline: RUE 30 lbs, LUE 10 lbs. Goal status: ongoing 01/15/24  6.  Pt will verbalize understanding of positioning to minimize UE pain. Goal status: ongoing discussed pillow under LUE, avoiding reaching with UE's 01/15/24  LONG TERM GOALS: Target date: 03/27/24  I with updated HEP  Goal status: INITIAL  2.  Pt will increase LUE box/ blocks score to 16 blocks  Goal status: INITIAL  3.  Pt will use LUE as a gross assist at least 15% of the time. Baseline: uses 5% Goal status: INITIAL  4.  Pt will report decreased drops with LUE. Baseline: frequent drops Goal status: INITIAL 5. Pt will verbalize understanding of AE/DME to maximize safety/ I including possible walker splint prn.  Goal status:  inital  ASSESSMENT:  CLINICAL IMPRESSION: Patient is progressing towards goals. Pt demonstrates improved fine motor coordination today during functional tasks. she reports practicing at home. lap.PERFORMANCE DEFICITS: in functional skills including ADLs, IADLs, coordination, dexterity, sensation, tone, ROM, strength, flexibility, Fine motor control, Gross motor control, mobility, balance, endurance, decreased knowledge of precautions, decreased knowledge of use of DME, and UE functional use, cognitive skills and psychosocial skills including coping strategies, environmental adaptation, habits, interpersonal interactions, and routines and behaviors.   IMPAIRMENTS: are limiting patient from ADLs, IADLs, rest and sleep, play, leisure, and social participation.   CO-MORBIDITIES: may  have co-morbidities  that affects occupational performance. Patient will benefit from skilled OT to address above impairments and improve overall function.  MODIFICATION OR ASSISTANCE TO COMPLETE EVALUATION: No modification of tasks or assist necessary to complete an evaluation.  OT OCCUPATIONAL PROFILE AND HISTORY: Detailed assessment: Review of records and additional review of physical, cognitive, psychosocial history related to current functional performance.  CLINICAL DECISION MAKING: LOW - limited treatment options, no task modification necessary  REHAB POTENTIAL: Good  EVALUATION COMPLEXITY: Low    PLAN:  OT FREQUENCY: 2x/week  OT DURATION: 12 weeks  PLANNED INTERVENTIONS: 97168 OT Re-evaluation, 97535 self care/ADL training, 16109 therapeutic exercise, 97530 therapeutic activity, 97112 neuromuscular re-education, 97140 manual therapy, 97035 ultrasound, 97018 paraffin, 60454 moist heat, 97010 cryotherapy, 97034 contrast bath, 97014 electrical stimulation unattended, passive range of motion, balance training, functional mobility training, psychosocial skills training, energy conservation, coping strategies  training, patient/family education, and DME and/or AE instructions  RECOMMENDED OTHER SERVICES: n/a  CONSULTED AND AGREED WITH PLAN OF CARE: Patient  PLAN FOR NEXT SESSION:  review putty, weightbearing through LUE   Amaal Dimartino, OT 01/15/2024, 1:32 PM

## 2024-01-16 ENCOUNTER — Encounter: Payer: Self-pay | Admitting: Occupational Therapy

## 2024-01-17 ENCOUNTER — Other Ambulatory Visit: Payer: Self-pay

## 2024-01-17 ENCOUNTER — Encounter: Payer: Self-pay | Admitting: Occupational Therapy

## 2024-01-17 ENCOUNTER — Ambulatory Visit: Admitting: Occupational Therapy

## 2024-01-17 DIAGNOSIS — R278 Other lack of coordination: Secondary | ICD-10-CM

## 2024-01-17 DIAGNOSIS — I69354 Hemiplegia and hemiparesis following cerebral infarction affecting left non-dominant side: Secondary | ICD-10-CM

## 2024-01-17 DIAGNOSIS — R293 Abnormal posture: Secondary | ICD-10-CM

## 2024-01-17 DIAGNOSIS — M25572 Pain in left ankle and joints of left foot: Secondary | ICD-10-CM | POA: Diagnosis not present

## 2024-01-17 DIAGNOSIS — M6281 Muscle weakness (generalized): Secondary | ICD-10-CM

## 2024-01-17 DIAGNOSIS — M25512 Pain in left shoulder: Secondary | ICD-10-CM | POA: Diagnosis not present

## 2024-01-17 DIAGNOSIS — M542 Cervicalgia: Secondary | ICD-10-CM | POA: Diagnosis not present

## 2024-01-17 DIAGNOSIS — R262 Difficulty in walking, not elsewhere classified: Secondary | ICD-10-CM | POA: Diagnosis not present

## 2024-01-17 NOTE — Therapy (Signed)
 OUTPATIENT OCCUPATIONAL THERAPY NEURO Treatment  Patient Name: Karen Jackson MRN: [id number] DOB:Jun 18, 1941, 83 y.o., female Today's Date: 01/17/2024  PCP: Dr. Genelle Kennedy REFERRING PROVIDER: Dr. Genelle Kennedy  END OF SESSION:  OT End of Session - 01/17/24 1111     Visit Number 5    Date for OT Re-Evaluation 03/27/24    Authorization Type MCR and  [employer]    Authorization Time Period 12 weeks    Authorization - Visit Number 5    OT Start Time 1104    OT Stop Time 1143    OT Time Calculation (min) 39 min    Activity Tolerance Patient tolerated treatment well    Behavior During Therapy WFL for tasks assessed/performed                 Past Medical History:  Diagnosis Date   Acute cystitis without hematuria    Acute diastolic CHF (congestive heart failure) (HCC)    Arthritis    Dyspnea    Dysrhythmia    Fever of unknown origin 03/19/2017   Hyperlipidemia    Hypertension    denies at preop   Multifocal pneumonia    Neuromuscular disorder (HCC)    neuropathy left arm and foot   Osteopenia    Paralysis (HCC)    partial left side from CVA    Persistent atrial fibrillation (HCC)    PONV (postoperative nausea and vomiting)    Pre-diabetes    Stroke [facility]) 2013   hemmorahgic   Past Surgical History:  Procedure Laterality Date   ANKLE SURGERY     APPENDECTOMY     CHOLECYSTECTOMY     HARDWARE REMOVAL Left 03/29/2023   Procedure: HARDWARE REMOVAL;  Surgeon: Amada Backer, MD;  Location: [facility];  Service: Orthopedics;  Laterality: Left;   HERNIA REPAIR     Esophagus   INCISION AND DRAINAGE OF WOUND Left 03/29/2023   Procedure: LEFT ANKLE WOUND IRRIGATION AND DEBRIDEMENT WOUND;  Surgeon: Amada Backer, MD;  Location: [facility];  Service: Orthopedics;  Laterality: Left;   JOINT REPLACEMENT     total- right partial- left   MASTECTOMY PARTIAL / LUMPECTOMY  2012   left   ORIF ANKLE FRACTURE Left 07/20/2018   Procedure: OPEN REDUCTION INTERNAL FIXATION  (ORIF) ANKLE FRACTURE;  Surgeon: Amada Backer, MD;  Location: [facility];  Service: Orthopedics;  Laterality: Left;   TOTAL KNEE ARTHROPLASTY Left 01/27/2019   Procedure: TOTAL KNEE ARTHROPLASTY;  Surgeon: Christie Cox, MD;  Location: [facility];  Service: Orthopedics;  Laterality: Left;   Patient Active Problem List   Diagnosis Date Noted   Goals of care, counseling/discussion 01/17/2022   Grief 01/17/2022   Secondary hypercoagulable state (HCC) 03/16/2021   Dizziness 03/10/2021   Orthostatic hypotension 10/15/2020   Presbycusis of both ears 03/08/2020   Mixed stress and urge urinary incontinence 12/02/2019   Macrocytosis 12/01/2019   Nutritional anemia 12/01/2019   S/P total knee replacement 01/27/2019   Recurrent left knee instability 07/05/2018   Respiratory failure with hypoxia (HCC) 08/30/2017   Hypoxemia    Heart failure with preserved ejection fraction (HCC), Grade 3 diastolic dysfunction 03/26/2017   PAF (paroxysmal atrial fibrillation) (HCC)    Dyspnea 03/19/2017   Encounter for preventive health examination 02/17/2016   Sensorineural hearing loss (SNHL), bilateral 01/26/2016   Hypomagnesemia 04/24/2014   Hemiparesis affecting left side as late effect of cerebrovascular accident (HCC) 04/24/2014   Nontraumatic cerebral hemorrhage (HCC) 04/30/2012   DM (diabetes mellitus) with complications (  HCC) 03/04/2010   OSTEOPENIA 01/21/2009   UNSPECIFIED VITAMIN D  DEFICIENCY 11/19/2007   HYPERCHOLESTEROLEMIA 10/25/2006   GASTROESOPHAGEAL REFLUX, NO ESOPHAGITIS 10/25/2006   DIVERTICULOSIS OF COLON 10/25/2006   Osteoarthritis 10/25/2006   CERVICAL SPINE DISORDER, NOS 10/25/2006    ONSET DATE: 12/03/23- referral date  REFERRING DIAG: I69.354 (ICD-10-CM) - Hemiplegia and hemiparesis following cerebral infarction affecting left non-dominant side   THERAPY DIAG:  Muscle weakness (generalized)  Hemiplegia and hemiparesis following cerebral infarction affecting left non-dominant side  (HCC)  Other lack of coordination  Abnormal posture  Rationale for Evaluation and Treatment: Rehabilitation  SUBJECTIVE:   SUBJECTIVE STATEMENT: Pt reports her hand feels better Pt accompanied by: self  PERTINENT HISTORY: CVA 2013, CHF, HTN, A-fib, TKA, s/p ORIF left ankle hardware removal, s/p injection to bilateral shoulders 01/02/24  PRECAUTIONS: Fall, no therapy to bilateral shoulders per MD  WEIGHT BEARING RESTRICTIONS: No  PAIN:  Are you having pain? Yes: NPRS scale: 2-3/10 hand, shoulders 6/10 Pain location: bilateral shoulders, left wrist Pain description: aching, sharp Aggravating factors: movement, Relieving factors: rest  FALLS: Has patient fallen in last 6 months? No  LIVING ENVIRONMENT: Lives with: has caregiver 8-2, 4:30-8pm Lives in: House/apartment Stairs: No, has ramp Has following equipment at home: Walker - 2 wheeled, shower chair, and Ramped entry  PLOF: Needs assistance with homemaking, Needs assistance with transfers, ADLS, plays bridge, she has  an aide at home that helps with meals, going to MD's, dressing and bathing, is alone at night    PATIENT GOALS: improve functional use of LUE  OBJECTIVE:  Note: Objective measures were completed at Evaluation unless otherwise noted.  HAND DOMINANCE: Right  ADLs: Overall ADLs: requires assist Transfers/ambulation related to ADLs: Eating: needs assit with cutting food Grooming: set up UB Dressing: mod A/ max  LB Dressing: max  Toileting: has grab bar, min A Bathing: min-mod A Tub Shower transfers: min-mod A to rolling shower chair, then caregivers roll you    IADLs:caregiver's perform IADLS/ home management  Medication management: pt and caregiver complete together  Financial management: nephew handles  MOBILITY STATUS: Needs Assist: Pt requires min-mod A for transfers, pt does not walk with caregiver's  POSTURE COMMENTS:  rounded shoulders and forward head       UPPER EXTREMITY ROM:   RUE elbow wrist and hand grossly WFL, shoulder flexion limited to 50* due to arthritis  Active ROM Right eval Left eval  Shoulder flexion  45  Shoulder abduction    Shoulder adduction    Shoulder extension    Shoulder internal rotation    Shoulder external rotation    Elbow flexion  WFL  Elbow extension  WFL  Wrist flexion  40  Wrist extension  30  Wrist ulnar deviation    Wrist radial deviation    Wrist pronation  WFL  Wrist supination  80%  (Blank rows = not tested)   HAND FUNCTION: Grip strength: Right: 30 lbs; Left: 10 lbs  COORDINATION: Box/ blocks LUE 13 blocks in 1 min   SENSATION: Light touch: Impaired     MUSCLE TONE: LUE: Mild and Hypertonic  COGNITION: Overall cognitive status: Within functional limits for tasks assessed    VISION ASSESSMENT:NT, wear glasses for distance vision  TREATMENT DATE:01/17/24-Pt reports her hand feels better today. Paraffin to L hand and wrist x 9 mins for stiffness, no adverse reactions. A/ROM finger flexion, opposition, finger extenstendion, thumb flexion A/ROM exercises for LUE, min v.c  Prayer stretch for wrist extension, min v.c. Joint mobs to thumb and digits. Pt met her wrist flexion/ extension for LUE. She demonstratesLUE wrist flexion/ extension: 50/50 fine motor coordiantion activities with LUE: flipping cards, picking up blocks to place in container the placing beanbags in a contaner, min-mod difficulty/ v.c, min-mod facilitation   01/15/24 Paraffin to L hand and wrist, while hotpack applied to  left shoulder  x 10 mins, mild pink coloration to left shoulder that diminished as therapy session continued.  Flipping playing cards, then picking up checkers with L hand on low surface in pt's lap, min to mod difficulty, min v.c Pt with improving control. Removing and replacing wooden dowel  pegs into pegboard with LUE, mod difficulty/ v.c Yellow putty exercises for sustained grip and pinch, min difficulty/ v.c  01/10/24 Paraffin to L hand and wrist, while hotpack applied to shoulders and neck x 11 mins, no adverse reactions.  Standing at sink to remove pegs from pegboard with RUE, min facillitation/ assist for standing. Pt transitioned to sitting to remove and replace wooden dowel pegs in pegboard, focus on LUE control, min-mod difficulty/ v.c Flipping playing cards on a lower surface on lap to minimize shoulder pain, improved performance, min difficulty.   01/07/24-Pt was instructed in HEP, min-mod v.c and facilitation to avoid compensatory shoulder hike. Functional grsap/ release of wooden dowel pegs, min facilitation, min-mod v.c to avoid compensation and shoulder hike.   01/03/24- see eval  A/ROM supination pronation, wrist extension, finger thumb opposition, finger flexion/ extension for LUE min v.c 10 reps each       PATIENT EDUCATION: Education details: see above Person educated: Patient Education method: Explanation, Demonstration, Verbal cues,  handout Education comprehension: verbalized understanding, returned demonstration, verbal cues required, and tactile cues required   HOME EXERCISE PROGRAM: 01/07/24 A/ROM, light functional use of LUE, cards, checkers   GOALS: Goals reviewed with patient? Yes  SHORT TERM GOALS: Target date: 02/03/24  I with inital HEP.  Goal status: ongoing, issued needs reinforcement 01/17/24  2.  Pt will increase A/ROM wrist extension to 40* for improved functional use. Baseline: 30* Goal status: met 50* 01/17/24  3.  Pt will increase LUE box/ blocks score to 16 blocks Baseline: 13 blocks Goal status: ongoing 01/15/24  4.  Pt will increase A/ROM wrist flexion to 50* for improved functional use.  Goal status met 50* 01/17/24  5.  Pt will increase LUE grip strength to 15 lbs for increased functional use. Baseline: RUE 30 lbs, LUE  10 lbs. Goal status: ongoing 01/15/24  6.  Pt will verbalize understanding of positioning to minimize UE pain. Goal status: ongoing discussed pillow under LUE, avoiding reaching with UE's 01/15/24  LONG TERM GOALS: Target date: 03/27/24  I with updated HEP  Goal status: INITIAL  2.  Pt will increase LUE box/ blocks score to 16 blocks  Goal status: INITIAL  3.  Pt will use LUE as a gross assist at least 15% of the time. Baseline: uses 5% Goal status: INITIAL  4.  Pt will report decreased drops with LUE. Baseline: frequent drops Goal status: INITIAL 5. Pt will verbalize understanding of AE/DME to maximize safety/ I including possible walker splint prn.  Goal status: inital  ASSESSMENT:  CLINICAL IMPRESSION: Patient is progressing towards  goals. Pt report her hand is doing better and less painful following therapy. Pt met STG's 2, 4. PERFORMANCE DEFICITS: in functional skills including ADLs, IADLs, coordination, dexterity, sensation, tone, ROM, strength, flexibility, Fine motor control, Gross motor control, mobility, balance, endurance, decreased knowledge of precautions, decreased knowledge of use of DME, and UE functional use, cognitive skills and psychosocial skills including coping strategies, environmental adaptation, habits, interpersonal interactions, and routines and behaviors.   IMPAIRMENTS: are limiting patient from ADLs, IADLs, rest and sleep, play, leisure, and social participation.   CO-MORBIDITIES: may have co-morbidities  that affects occupational performance. Patient will benefit from skilled OT to address above impairments and improve overall function.  MODIFICATION OR ASSISTANCE TO COMPLETE EVALUATION: No modification of tasks or assist necessary to complete an evaluation.  OT OCCUPATIONAL PROFILE AND HISTORY: Detailed assessment: Review of records and additional review of physical, cognitive, psychosocial history related to current functional performance.  CLINICAL  DECISION MAKING: LOW - limited treatment options, no task modification necessary  REHAB POTENTIAL: Good  EVALUATION COMPLEXITY: Low    PLAN:  OT FREQUENCY: 2x/week  OT DURATION: 12 weeks  PLANNED INTERVENTIONS: 97168 OT Re-evaluation, 97535 self care/ADL training, 62130 therapeutic exercise, 97530 therapeutic activity, 97112 neuromuscular re-education, 97140 manual therapy, 97035 ultrasound, 97018 paraffin, 86578 moist heat, 97010 cryotherapy, 97034 contrast bath, 97014 electrical stimulation unattended, passive range of motion, balance training, functional mobility training, psychosocial skills training, energy conservation, coping strategies training, patient/family education, and DME and/or AE instructions  RECOMMENDED OTHER SERVICES: n/a  CONSULTED AND AGREED WITH PLAN OF CARE: Patient  PLAN FOR NEXT SESSION:  review putty, functional use of LUE.   Tanganyika Bowlds, OT 01/17/2024, 11:12 AM

## 2024-01-20 DIAGNOSIS — N3091 Cystitis, unspecified with hematuria: Secondary | ICD-10-CM | POA: Diagnosis not present

## 2024-01-22 ENCOUNTER — Encounter: Payer: Self-pay | Admitting: Occupational Therapy

## 2024-01-22 ENCOUNTER — Ambulatory Visit: Admitting: Occupational Therapy

## 2024-01-22 ENCOUNTER — Other Ambulatory Visit: Payer: Self-pay

## 2024-01-22 DIAGNOSIS — I69354 Hemiplegia and hemiparesis following cerebral infarction affecting left non-dominant side: Secondary | ICD-10-CM

## 2024-01-22 DIAGNOSIS — R262 Difficulty in walking, not elsewhere classified: Secondary | ICD-10-CM | POA: Diagnosis not present

## 2024-01-22 DIAGNOSIS — M6281 Muscle weakness (generalized): Secondary | ICD-10-CM

## 2024-01-22 DIAGNOSIS — M25572 Pain in left ankle and joints of left foot: Secondary | ICD-10-CM | POA: Diagnosis not present

## 2024-01-22 DIAGNOSIS — M25512 Pain in left shoulder: Secondary | ICD-10-CM | POA: Diagnosis not present

## 2024-01-22 DIAGNOSIS — R278 Other lack of coordination: Secondary | ICD-10-CM

## 2024-01-22 DIAGNOSIS — M542 Cervicalgia: Secondary | ICD-10-CM | POA: Diagnosis not present

## 2024-01-22 DIAGNOSIS — R293 Abnormal posture: Secondary | ICD-10-CM

## 2024-01-22 NOTE — Therapy (Signed)
 OUTPATIENT OCCUPATIONAL THERAPY NEURO Treatment  Patient Name: Karen Jackson MRN: [id number] DOB:05-14-1941, 83 y.o., female Today's Date: 01/22/2024  PCP: Dr. Genelle Kennedy REFERRING PROVIDER: Dr. Genelle Kennedy  END OF SESSION:  OT End of Session - 01/22/24 1305     Visit Number 6    Date for OT Re-Evaluation 03/27/24    Authorization Type MCR and  [employer]    Authorization Time Period 12 weeks    OT Start Time 1102    OT Stop Time 1144    OT Time Calculation (min) 42 min    Activity Tolerance Patient tolerated treatment well    Behavior During Therapy WFL for tasks assessed/performed                  Past Medical History:  Diagnosis Date   Acute cystitis without hematuria    Acute diastolic CHF (congestive heart failure) (HCC)    Arthritis    Dyspnea    Dysrhythmia    Fever of unknown origin 03/19/2017   Hyperlipidemia    Hypertension    denies at preop   Multifocal pneumonia    Neuromuscular disorder (HCC)    neuropathy left arm and foot   Osteopenia    Paralysis (HCC)    partial left side from CVA    Persistent atrial fibrillation (HCC)    PONV (postoperative nausea and vomiting)    Pre-diabetes    Stroke [facility]) 2013   hemmorahgic   Past Surgical History:  Procedure Laterality Date   ANKLE SURGERY     APPENDECTOMY     CHOLECYSTECTOMY     HARDWARE REMOVAL Left 03/29/2023   Procedure: HARDWARE REMOVAL;  Surgeon: Amada Backer, MD;  Location: [facility];  Service: Orthopedics;  Laterality: Left;   HERNIA REPAIR     Esophagus   INCISION AND DRAINAGE OF WOUND Left 03/29/2023   Procedure: LEFT ANKLE WOUND IRRIGATION AND DEBRIDEMENT WOUND;  Surgeon: Amada Backer, MD;  Location: [facility];  Service: Orthopedics;  Laterality: Left;   JOINT REPLACEMENT     total- right partial- left   MASTECTOMY PARTIAL / LUMPECTOMY  2012   left   ORIF ANKLE FRACTURE Left 07/20/2018   Procedure: OPEN REDUCTION INTERNAL FIXATION (ORIF) ANKLE FRACTURE;  Surgeon:  Amada Backer, MD;  Location: [facility];  Service: Orthopedics;  Laterality: Left;   TOTAL KNEE ARTHROPLASTY Left 01/27/2019   Procedure: TOTAL KNEE ARTHROPLASTY;  Surgeon: Christie Cox, MD;  Location: [facility];  Service: Orthopedics;  Laterality: Left;   Patient Active Problem List   Diagnosis Date Noted   Goals of care, counseling/discussion 01/17/2022   Grief 01/17/2022   Secondary hypercoagulable state (HCC) 03/16/2021   Dizziness 03/10/2021   Orthostatic hypotension 10/15/2020   Presbycusis of both ears 03/08/2020   Mixed stress and urge urinary incontinence 12/02/2019   Macrocytosis 12/01/2019   Nutritional anemia 12/01/2019   S/P total knee replacement 01/27/2019   Recurrent left knee instability 07/05/2018   Respiratory failure with hypoxia (HCC) 08/30/2017   Hypoxemia    Heart failure with preserved ejection fraction (HCC), Grade 3 diastolic dysfunction 03/26/2017   PAF (paroxysmal atrial fibrillation) (HCC)    Dyspnea 03/19/2017   Encounter for preventive health examination 02/17/2016   Sensorineural hearing loss (SNHL), bilateral 01/26/2016   Hypomagnesemia 04/24/2014   Hemiparesis affecting left side as late effect of cerebrovascular accident (HCC) 04/24/2014   Nontraumatic cerebral hemorrhage (HCC) 04/30/2012   DM (diabetes mellitus) with complications (HCC) 03/04/2010   OSTEOPENIA 01/21/2009  UNSPECIFIED VITAMIN D  DEFICIENCY 11/19/2007   HYPERCHOLESTEROLEMIA 10/25/2006   GASTROESOPHAGEAL REFLUX, NO ESOPHAGITIS 10/25/2006   DIVERTICULOSIS OF COLON 10/25/2006   Osteoarthritis 10/25/2006   CERVICAL SPINE DISORDER, NOS 10/25/2006    ONSET DATE: 12/03/23- referral date  REFERRING DIAG: I69.354 (ICD-10-CM) - Hemiplegia and hemiparesis following cerebral infarction affecting left non-dominant side   THERAPY DIAG:  Muscle weakness (generalized)  Hemiplegia and hemiparesis following cerebral infarction affecting left non-dominant side (HCC)  Abnormal posture  Other lack of  coordination  Rationale for Evaluation and Treatment: Rehabilitation  SUBJECTIVE:   SUBJECTIVE STATEMENT: Pt reports increased pain due to the weather  Pt accompanied by: self  PERTINENT HISTORY: CVA 2013, CHF, HTN, A-fib, TKA, s/p ORIF left ankle hardware removal, s/p injection to bilateral shoulders 01/02/24  PRECAUTIONS: Fall, no therapy to bilateral shoulders per MD  WEIGHT BEARING RESTRICTIONS: No  PAIN:  Are you having pain? Yes: NPRS scale: 610 hand, shoulders 6/10 Pain location: bilateral shoulders, left wrist Pain description: aching, sharp Aggravating factors: movement, Relieving factors: rest  FALLS: Has patient fallen in last 6 months? No  LIVING ENVIRONMENT: Lives with: has caregiver 8-2, 4:30-8pm Lives in: House/apartment Stairs: No, has ramp Has following equipment at home: Walker - 2 wheeled, shower chair, and Ramped entry  PLOF: Needs assistance with homemaking, Needs assistance with transfers, ADLS, plays bridge, she has  an aide at home that helps with meals, going to MD's, dressing and bathing, is alone at night    PATIENT GOALS: improve functional use of LUE  OBJECTIVE:  Note: Objective measures were completed at Evaluation unless otherwise noted.  HAND DOMINANCE: Right  ADLs: Overall ADLs: requires assist Transfers/ambulation related to ADLs: Eating: needs assit with cutting food Grooming: set up UB Dressing: mod A/ max  LB Dressing: max  Toileting: has grab bar, min A Bathing: min-mod A Tub Shower transfers: min-mod A to rolling shower chair, then caregivers roll you    IADLs:caregiver's perform IADLS/ home management  Medication management: pt and caregiver complete together  Financial management: nephew handles  MOBILITY STATUS: Needs Assist: Pt requires min-mod A for transfers, pt does not walk with caregiver's  POSTURE COMMENTS:  rounded shoulders and forward head       UPPER EXTREMITY ROM:  RUE elbow wrist and hand grossly  WFL, shoulder flexion limited to 50* due to arthritis  Active ROM Right eval Left eval  Shoulder flexion  45  Shoulder abduction    Shoulder adduction    Shoulder extension    Shoulder internal rotation    Shoulder external rotation    Elbow flexion  WFL  Elbow extension  WFL  Wrist flexion  40  Wrist extension  30  Wrist ulnar deviation    Wrist radial deviation    Wrist pronation  WFL  Wrist supination  80%  (Blank rows = not tested)   HAND FUNCTION: Grip strength: Right: 30 lbs; Left: 10 lbs  COORDINATION: Box/ blocks LUE 13 blocks in 1 min   SENSATION: Light touch: Impaired     MUSCLE TONE: LUE: Mild and Hypertonic  COGNITION: Overall cognitive status: Within functional limits for tasks assessed    VISION ASSESSMENT:NT, wear glasses for distance vision  TREATMENT DATE:01/22/23-Pt reports pain and stiffness due to the weather Paraffin to L hand and wrist x 9 mins for stiffness, along with hotpack to neck and shoulders, no adverse reactions. A/ROM finger flexion, opposition,wrist flexion extension, and ulnar / rdial deviation AA/ROM exercises for LUE, min v.c  Prayer stretch for wrist extension, min v.c. Joint mobs to thumb and digits. Fine motor coordination tasks, picking up coins to place in container with mod difficulty Flipping playing cards, min-mod dificulty then placing and removing wooden dowel pegs into pegboard.   01/17/24-Pt reports her hand feels better today. Paraffin to L hand and wrist x 9 mins for stiffness, no adverse reactions. A/ROM finger flexion, opposition, finger extension, thumb flexion A/ROM exercises for LUE, min v.c  Prayer stretch for wrist extension, min v.c. Joint mobs to thumb and digits. Pt met her wrist flexion/ extension for LUE. She demonstrates LUE wrist flexion/ extension: 50/50 fine motor  coordiantion activities with LUE: flipping cards, picking up blocks to place in container the placing beanbags in a container, min-mod difficulty/ v.c, min-mod facilitation   01/15/24 Paraffin to L hand and wrist, while hotpack applied to  left shoulder  x 10 mins, mild pink coloration to left shoulder that diminished as therapy session continued.  Flipping playing cards, then picking up checkers with L hand on low surface in pt's lap, min to mod difficulty, min v.c Pt with improving control. Removing and replacing wooden dowel pegs into pegboard with LUE, mod difficulty/ v.c Yellow putty exercises for sustained grip and pinch, min difficulty/ v.c  01/10/24 Paraffin to L hand and wrist, while hotpack applied to shoulders and neck x 11 mins, no adverse reactions.  Standing at sink to remove pegs from pegboard with RUE, min facillitation/ assist for standing. Pt transitioned to sitting to remove and replace wooden dowel pegs in pegboard, focus on LUE control, min-mod difficulty/ v.c Flipping playing cards on a lower surface on lap to minimize shoulder pain, improved performance, min difficulty.   01/07/24-Pt was instructed in HEP, min-mod v.c and facilitation to avoid compensatory shoulder hike. Functional grsap/ release of wooden dowel pegs, min facilitation, min-mod v.c to avoid compensation and shoulder hike.   01/03/24- see eval  A/ROM supination pronation, wrist extension, finger thumb opposition, finger flexion/ extension for LUE min v.c 10 reps each       PATIENT EDUCATION: Education details: flipping cards and picking up coins with LUE Person educated: Patient Education method: Programmer, multimedia, Demonstration, Verbal cues,  Education comprehension: verbalized understanding, returned demonstration, verbal cues required, and tactile cues required   HOME EXERCISE PROGRAM: 01/07/24 A/ROM, light functional use of LUE, cards, checkers   GOALS: Goals reviewed with patient? Yes  SHORT TERM  GOALS: Target date: 02/03/24  I with inital HEP.  Goal status: met, pt is performing at home. 01/22/24  2.  Pt will increase A/ROM wrist extension to 40* for improved functional use. Baseline: 30* Goal status: met 50* 01/17/24  3.  Pt will increase LUE box/ blocks score to 16 blocks Baseline: 13 blocks Goal status: ongoing 01/15/24  4.  Pt will increase A/ROM wrist flexion to 50* for improved functional use.  Goal status met 50* 01/17/24  5.  Pt will increase LUE grip strength to 15 lbs for increased functional use. Baseline: RUE 30 lbs, LUE 10 lbs. Goal status: ongoing 01/15/24  6.  Pt will verbalize understanding of positioning to minimize UE pain. Goal status: ongoing discussed pillow under LUE, avoiding reaching with UE's 01/15/24  LONG  TERM GOALS: Target date: 03/27/24  I with updated HEP  Goal status: INITIAL  2.  Pt will increase LUE box/ blocks score to 16 blocks  Goal status: INITIAL  3.  Pt will use LUE as a gross assist at least 15% of the time. Baseline: uses 5% Goal status: INITIAL  4.  Pt will report decreased drops with LUE. Baseline: frequent drops Goal status: INITIAL 5. Pt will verbalize understanding of AE/DME to maximize safety/ I including possible walker splint prn.  Goal status: inital  ASSESSMENT:  CLINICAL IMPRESSION: Patient is progressing towards goals. Pt reports increased pain today due to the rainy weather. Pt demonstrates improved fine motor coordination. PERFORMANCE DEFICITS: in functional skills including ADLs, IADLs, coordination, dexterity, sensation, tone, ROM, strength, flexibility, Fine motor control, Gross motor control, mobility, balance, endurance, decreased knowledge of precautions, decreased knowledge of use of DME, and UE functional use, cognitive skills and psychosocial skills including coping strategies, environmental adaptation, habits, interpersonal interactions, and routines and behaviors.   IMPAIRMENTS: are limiting patient  from ADLs, IADLs, rest and sleep, play, leisure, and social participation.   CO-MORBIDITIES: may have co-morbidities  that affects occupational performance. Patient will benefit from skilled OT to address above impairments and improve overall function.  MODIFICATION OR ASSISTANCE TO COMPLETE EVALUATION: No modification of tasks or assist necessary to complete an evaluation.  OT OCCUPATIONAL PROFILE AND HISTORY: Detailed assessment: Review of records and additional review of physical, cognitive, psychosocial history related to current functional performance.  CLINICAL DECISION MAKING: LOW - limited treatment options, no task modification necessary  REHAB POTENTIAL: Good  EVALUATION COMPLEXITY: Low    PLAN:  OT FREQUENCY: 2x/week  OT DURATION: 12 weeks  PLANNED INTERVENTIONS: 97168 OT Re-evaluation, 97535 self care/ADL training, 65784 therapeutic exercise, 97530 therapeutic activity, 97112 neuromuscular re-education, 97140 manual therapy, 97035 ultrasound, 97018 paraffin, 69629 moist heat, 97010 cryotherapy, 97034 contrast bath, 97014 electrical stimulation unattended, passive range of motion, balance training, functional mobility training, psychosocial skills training, energy conservation, coping strategies training, patient/family education, and DME and/or AE instructions  RECOMMENDED OTHER SERVICES: n/a  CONSULTED AND AGREED WITH PLAN OF CARE: Patient  PLAN FOR NEXT SESSION:  review putty, functional use of LUE, gentl ROM   Minami Arriaga, OT 01/22/2024, 1:06 PM

## 2024-01-24 ENCOUNTER — Encounter: Payer: Self-pay | Admitting: Occupational Therapy

## 2024-01-24 ENCOUNTER — Ambulatory Visit: Admitting: Occupational Therapy

## 2024-01-24 DIAGNOSIS — R262 Difficulty in walking, not elsewhere classified: Secondary | ICD-10-CM | POA: Diagnosis not present

## 2024-01-24 DIAGNOSIS — M25512 Pain in left shoulder: Secondary | ICD-10-CM | POA: Diagnosis not present

## 2024-01-24 DIAGNOSIS — I69354 Hemiplegia and hemiparesis following cerebral infarction affecting left non-dominant side: Secondary | ICD-10-CM

## 2024-01-24 DIAGNOSIS — M6281 Muscle weakness (generalized): Secondary | ICD-10-CM

## 2024-01-24 DIAGNOSIS — M542 Cervicalgia: Secondary | ICD-10-CM | POA: Diagnosis not present

## 2024-01-24 DIAGNOSIS — R293 Abnormal posture: Secondary | ICD-10-CM

## 2024-01-24 DIAGNOSIS — M25572 Pain in left ankle and joints of left foot: Secondary | ICD-10-CM | POA: Diagnosis not present

## 2024-01-24 DIAGNOSIS — R278 Other lack of coordination: Secondary | ICD-10-CM

## 2024-01-24 NOTE — Therapy (Signed)
 OUTPATIENT OCCUPATIONAL THERAPY NEURO Treatment  Patient Name: Karen Jackson MRN: [id number] DOB:[birth year], 83 y.o., female Today's Date: 01/24/2024  PCP: Dr. Genelle Kennedy REFERRING PROVIDER: Dr. Genelle Kennedy  END OF SESSION:  OT End of Session - 01/24/24 1216     Visit Number 7    Number of Visits 25    Date for OT Re-Evaluation 03/27/24    Authorization Type MCR and  [employer]    Authorization Time Period 12 weeks    Authorization - Visit Number 7    Progress Note Due on Visit 10    OT Start Time 1103    OT Stop Time 1145    OT Time Calculation (min) 42 min    Activity Tolerance Patient tolerated treatment well    Behavior During Therapy WFL for tasks assessed/performed                   Past Medical History:  Diagnosis Date   Acute cystitis without hematuria    Acute diastolic CHF (congestive heart failure) (HCC)    Arthritis    Dyspnea    Dysrhythmia    Fever of unknown origin 03/19/2017   Hyperlipidemia    Hypertension    denies at preop   Multifocal pneumonia    Neuromuscular disorder (HCC)    neuropathy left arm and foot   Osteopenia    Paralysis (HCC)    partial left side from CVA    Persistent atrial fibrillation (HCC)    PONV (postoperative nausea and vomiting)    Pre-diabetes    Stroke [facility]) 2013   hemmorahgic   Past Surgical History:  Procedure Laterality Date   ANKLE SURGERY     APPENDECTOMY     CHOLECYSTECTOMY     HARDWARE REMOVAL Left 03/29/2023   Procedure: HARDWARE REMOVAL;  Surgeon: Amada Backer, MD;  Location: [facility];  Service: Orthopedics;  Laterality: Left;   HERNIA REPAIR     Esophagus   INCISION AND DRAINAGE OF WOUND Left 03/29/2023   Procedure: LEFT ANKLE WOUND IRRIGATION AND DEBRIDEMENT WOUND;  Surgeon: Amada Backer, MD;  Location:  SURGERY CENTER;  Service: Orthopedics;  Laterality: Left;   JOINT REPLACEMENT     total- right partial- left   MASTECTOMY PARTIAL / LUMPECTOMY  2012   left   ORIF ANKLE FRACTURE  Left 07/20/2018   Procedure: OPEN REDUCTION INTERNAL FIXATION (ORIF) ANKLE FRACTURE;  Surgeon: Amada Backer, MD;  Location: [facility] OR;  Service: Orthopedics;  Laterality: Left;   TOTAL KNEE ARTHROPLASTY Left 01/27/2019   Procedure: TOTAL KNEE ARTHROPLASTY;  Surgeon: Christie Cox, MD;  Location: [facility];  Service: Orthopedics;  Laterality: Left;   Patient Active Problem List   Diagnosis Date Noted   Goals of care, counseling/discussion 01/17/2022   Grief 01/17/2022   Secondary hypercoagulable state (HCC) 03/16/2021   Dizziness 03/10/2021   Orthostatic hypotension 10/15/2020   Presbycusis of both ears 03/08/2020   Mixed stress and urge urinary incontinence 12/02/2019   Macrocytosis 12/01/2019   Nutritional anemia 12/01/2019   S/P total knee replacement 01/27/2019   Recurrent left knee instability 07/05/2018   Respiratory failure with hypoxia (HCC) 08/30/2017   Hypoxemia    Heart failure with preserved ejection fraction (HCC), Grade 3 diastolic dysfunction 03/26/2017   PAF (paroxysmal atrial fibrillation) (HCC)    Dyspnea 03/19/2017   Encounter for preventive health examination 02/17/2016   Sensorineural hearing loss (SNHL), bilateral 01/26/2016   Hypomagnesemia 04/24/2014   Hemiparesis affecting left side as late effect of  cerebrovascular accident (HCC) 04/24/2014   Nontraumatic cerebral hemorrhage (HCC) 04/30/2012   DM (diabetes mellitus) with complications (HCC) 03/04/2010   OSTEOPENIA 01/21/2009   UNSPECIFIED VITAMIN D  DEFICIENCY 11/19/2007   HYPERCHOLESTEROLEMIA 10/25/2006   GASTROESOPHAGEAL REFLUX, NO ESOPHAGITIS 10/25/2006   DIVERTICULOSIS OF COLON 10/25/2006   Osteoarthritis 10/25/2006   CERVICAL SPINE DISORDER, NOS 10/25/2006    ONSET DATE: 12/03/23- referral date  REFERRING DIAG: I69.354 (ICD-10-CM) - Hemiplegia and hemiparesis following cerebral infarction affecting left non-dominant side   THERAPY DIAG:  Muscle weakness (generalized)  Hemiplegia and hemiparesis following  cerebral infarction affecting left non-dominant side (HCC)  Abnormal posture  Other lack of coordination  Rationale for Evaluation and Treatment: Rehabilitation  SUBJECTIVE:   SUBJECTIVE STATEMENT: Pt reports pain is a little better today Pt accompanied by: self  PERTINENT HISTORY: CVA 2013, CHF, HTN, A-fib, TKA, s/p ORIF left ankle hardware removal, s/p injection to bilateral shoulders 01/02/24  PRECAUTIONS: Fall, no therapy to bilateral shoulders per MD  WEIGHT BEARING RESTRICTIONS: No  PAIN:  Are you having pain? Yes: NPRS scale: 4/10 hand, shoulders 5/10 Pain location: bilateral shoulders, left wrist Pain description: aching, sharp Aggravating factors: movement, Relieving factors: rest  FALLS: Has patient fallen in last 6 months? No  LIVING ENVIRONMENT: Lives with: has caregiver 8-2, 4:30-8pm Lives in: House/apartment Stairs: No, has ramp Has following equipment at home: Walker - 2 wheeled, shower chair, and Ramped entry  PLOF: Needs assistance with homemaking, Needs assistance with transfers, ADLS, plays bridge, she has  an aide at home that helps with meals, going to MD's, dressing and bathing, is alone at night    PATIENT GOALS: improve functional use of LUE  OBJECTIVE:  Note: Objective measures were completed at Evaluation unless otherwise noted.  HAND DOMINANCE: Right  ADLs: Overall ADLs: requires assist Transfers/ambulation related to ADLs: Eating: needs assit with cutting food Grooming: set up UB Dressing: mod A/ max  LB Dressing: max  Toileting: has grab bar, min A Bathing: min-mod A Tub Shower transfers: min-mod A to rolling shower chair, then caregivers roll you    IADLs:caregiver's perform IADLS/ home management  Medication management: pt and caregiver complete together  Financial management: nephew handles  MOBILITY STATUS: Needs Assist: Pt requires min-mod A for transfers, pt does not walk with caregiver's  POSTURE COMMENTS:  rounded  shoulders and forward head       UPPER EXTREMITY ROM:  RUE elbow wrist and hand grossly WFL, shoulder flexion limited to 50* due to arthritis  Active ROM Right eval Left eval  Shoulder flexion  45  Shoulder abduction    Shoulder adduction    Shoulder extension    Shoulder internal rotation    Shoulder external rotation    Elbow flexion  WFL  Elbow extension  WFL  Wrist flexion  40  Wrist extension  30  Wrist ulnar deviation    Wrist radial deviation    Wrist pronation  WFL  Wrist supination  80%  (Blank rows = not tested)   HAND FUNCTION: Grip strength: Right: 30 lbs; Left: 10 lbs  COORDINATION: Box/ blocks LUE 13 blocks in 1 min   SENSATION: Light touch: Impaired     MUSCLE TONE: LUE: Mild and Hypertonic  COGNITION: Overall cognitive status: Within functional limits for tasks assessed    VISION ASSESSMENT:NT, wear glasses for distance vision  TREATMENT DATE:01/24/24-Paraffin to L hand and wrist x 9 mins for stiffness, along with hotpack to neck and shoulders, no adverse reactions. A/ROM finger flexion,then finger extension,  LUE, min v.c  Reviewed yellow putty exercises for composite grip and then pt performed roll and fingertip pinch, min-mod difficulty for control ,  v.c  Fine motor coordination tasks, picking up marbles then checkers to place in container with min difficulty Flipping playing cards, min-mod dificulty then placing  removing wooden dowel pegs tehn replacing into pegboard mod difficulty. Therapist proved recommendations regarding comfort cool thumb CMC brace for LUE 01/22/23-Pt reports pain and stiffness due to the weather Paraffin to L hand and wrist x 9 mins for stiffness, along with hotpack to neck and shoulders, no adverse reactions. A/ROM finger flexion, opposition,wrist flexion extension, and ulnar / rdial  deviation AA/ROM exercises for LUE, min v.c  Prayer stretch for wrist extension, min v.c. Joint mobs to thumb and digits. Fine motor coordination tasks, picking up coins to place in container with mod difficulty Flipping playing cards, min-mod dificulty then placing and removing wooden dowel pegs into pegboard.   01/17/24-Pt reports her hand feels better today. Paraffin to L hand and wrist x 9 mins for stiffness, no adverse reactions. A/ROM finger flexion, opposition, finger extension, thumb flexion A/ROM exercises for LUE, min v.c  Prayer stretch for wrist extension, min v.c. Joint mobs to thumb and digits. Pt met her wrist flexion/ extension for LUE. She demonstrates LUE wrist flexion/ extension: 50/50 fine motor coordiantion activities with LUE: flipping cards, picking up blocks to place in container the placing beanbags in a container, min-mod difficulty/ v.c, min-mod facilitation   01/15/24 Paraffin to L hand and wrist, while hotpack applied to  left shoulder  x 10 mins, mild pink coloration to left shoulder that diminished as therapy session continued.  Flipping playing cards, then picking up checkers with L hand on low surface in pt's lap, min to mod difficulty, min v.c Pt with improving control. Removing and replacing wooden dowel pegs into pegboard with LUE, mod difficulty/ v.c Yellow putty exercises for sustained grip and pinch, min difficulty/ v.c  01/10/24 Paraffin to L hand and wrist, while hotpack applied to shoulders and neck x 11 mins, no adverse reactions.  Standing at sink to remove pegs from pegboard with RUE, min facillitation/ assist for standing. Pt transitioned to sitting to remove and replace wooden dowel pegs in pegboard, focus on LUE control, min-mod difficulty/ v.c Flipping playing cards on a lower surface on lap to minimize shoulder pain, improved performance, min difficulty.   01/07/24-Pt was instructed in HEP, min-mod v.c and facilitation to avoid compensatory  shoulder hike. Functional grsap/ release of wooden dowel pegs, min facilitation, min-mod v.c to avoid compensation and shoulder hike.   01/03/24- see eval  A/ROM supination pronation, wrist extension, finger thumb opposition, finger flexion/ extension for LUE min v.c 10 reps each       PATIENT EDUCATION: Education details: yellow putty exercises review, splint recommendation Person educated: Patient Education method: Explanation, Demonstration, Verbal cues,  Education comprehension: verbalized understanding, returned demonstration, verbal cues required, and tactile cues required   HOME EXERCISE PROGRAM: 01/07/24 A/ROM, light functional use of LUE, cards, checkers yellow putty  GOALS: Goals reviewed with patient? Yes  SHORT TERM GOALS: Target date: 02/03/24  I with inital HEP.  Goal status: met, pt is performing at home. 01/22/24  2.  Pt will increase A/ROM wrist extension to 40* for improved functional use. Baseline: 30* Goal status:  met 50* 01/17/24  3.  Pt will increase LUE box/ blocks score to 16 blocks Baseline: 13 blocks Goal status: ongoing 9 blocks 01/24/24  4.  Pt will increase A/ROM wrist flexion to 50* for improved functional use.  Goal status met 50* 01/17/24  5.  Pt will increase LUE grip strength to 15 lbs for increased functional use. Baseline: RUE 30 lbs, LUE 10 lbs. Goal status: met 17 lbs 01/24/24  6.  Pt will verbalize understanding of positioning to minimize UE pain. Goal status: ongoing discussed pillow under LUE, avoiding reaching with UE's 01/24/24  LONG TERM GOALS: Target date: 03/27/24  I with updated HEP  Goal status: INITIAL  2.  Pt will increase LUE box/ blocks score to 16 blocks  Goal status: INITIAL  3.  Pt will use LUE as a gross assist at least 15% of the time. Baseline: uses 5% Goal status: INITIAL  4.  Pt will report decreased drops with LUE. Baseline: frequent drops Goal status: INITIAL 5. Pt will verbalize understanding of  AE/DME to maximize safety/ I including possible walker splint prn.  Goal status: inital 6. Pt will increase LUE grip strength to 20 lbs for increased functional use. (new 01/24/24 since short term goal met.) Baseline: RUE 30 lbs, LUE 10 lbs. Goal status: updated/ revised goal ASSESSMENT:  CLINICAL IMPRESSION: Patient is progressing towards goals. Pt  demonstrates increased grip strength. she met STG #5. PERFORMANCE DEFICITS: in functional skills including ADLs, IADLs, coordination, dexterity, sensation, tone, ROM, strength, flexibility, Fine motor control, Gross motor control, mobility, balance, endurance, decreased knowledge of precautions, decreased knowledge of use of DME, and UE functional use, cognitive skills and psychosocial skills including coping strategies, environmental adaptation, habits, interpersonal interactions, and routines and behaviors.   IMPAIRMENTS: are limiting patient from ADLs, IADLs, rest and sleep, play, leisure, and social participation.   CO-MORBIDITIES: may have co-morbidities  that affects occupational performance. Patient will benefit from skilled OT to address above impairments and improve overall function.  MODIFICATION OR ASSISTANCE TO COMPLETE EVALUATION: No modification of tasks or assist necessary to complete an evaluation.  OT OCCUPATIONAL PROFILE AND HISTORY: Detailed assessment: Review of records and additional review of physical, cognitive, psychosocial history related to current functional performance.  CLINICAL DECISION MAKING: LOW - limited treatment options, no task modification necessary  REHAB POTENTIAL: Good  EVALUATION COMPLEXITY: Low    PLAN:  OT FREQUENCY: 2x/week  OT DURATION: 12 weeks  PLANNED INTERVENTIONS: 97168 OT Re-evaluation, 97535 self care/ADL training, 82956 therapeutic exercise, 97530 therapeutic activity, 97112 neuromuscular re-education, 97140 manual therapy, 97035 ultrasound, 97018 paraffin, 21308 moist heat, 97010  cryotherapy, 97034 contrast bath, 97014 electrical stimulation unattended, passive range of motion, balance training, functional mobility training, psychosocial skills training, energy conservation, coping strategies training, patient/family education, and DME and/or AE instructions  RECOMMENDED OTHER SERVICES: n/a  CONSULTED AND AGREED WITH PLAN OF CARE: Patient  PLAN FOR NEXT SESSION:  paraffin, functional use LUE, assess splint wear   Sriya Kroeze, OT 01/24/2024, 12:17 PM

## 2024-01-29 ENCOUNTER — Ambulatory Visit: Attending: Internal Medicine | Admitting: Occupational Therapy

## 2024-01-29 DIAGNOSIS — R270 Ataxia, unspecified: Secondary | ICD-10-CM | POA: Diagnosis not present

## 2024-01-29 DIAGNOSIS — R293 Abnormal posture: Secondary | ICD-10-CM | POA: Insufficient documentation

## 2024-01-29 DIAGNOSIS — R278 Other lack of coordination: Secondary | ICD-10-CM | POA: Diagnosis not present

## 2024-01-29 DIAGNOSIS — I69354 Hemiplegia and hemiparesis following cerebral infarction affecting left non-dominant side: Secondary | ICD-10-CM | POA: Diagnosis not present

## 2024-01-29 DIAGNOSIS — M6281 Muscle weakness (generalized): Secondary | ICD-10-CM | POA: Insufficient documentation

## 2024-01-29 NOTE — Therapy (Signed)
 OUTPATIENT OCCUPATIONAL THERAPY NEURO Treatment  Patient Name: Karen Jackson MRN: [id number] DOB:June 22, 1941, 83 y.o., female Today's Date: 01/29/2024  PCP: Dr. Genelle Kennedy REFERRING PROVIDER: Dr. Genelle Kennedy  END OF SESSION:  OT End of Session - 01/29/24 1114     Visit Number 8    Number of Visits 25    Date for OT Re-Evaluation 03/27/24    Authorization Type MCR and  [employer]    Authorization Time Period 12 weeks    Authorization - Visit Number 8    Progress Note Due on Visit 10    OT Start Time 1104    OT Stop Time 1144    OT Time Calculation (min) 40 min                   Past Medical History:  Diagnosis Date   Acute cystitis without hematuria    Acute diastolic CHF (congestive heart failure) (HCC)    Arthritis    Dyspnea    Dysrhythmia    Fever of unknown origin 03/19/2017   Hyperlipidemia    Hypertension    denies at preop   Multifocal pneumonia    Neuromuscular disorder (HCC)    neuropathy left arm and foot   Osteopenia    Paralysis (HCC)    partial left side from CVA    Persistent atrial fibrillation (HCC)    PONV (postoperative nausea and vomiting)    Pre-diabetes    Stroke [facility]) 2013   hemmorahgic   Past Surgical History:  Procedure Laterality Date   ANKLE SURGERY     APPENDECTOMY     CHOLECYSTECTOMY     HARDWARE REMOVAL Left 03/29/2023   Procedure: HARDWARE REMOVAL;  Surgeon: Amada Backer, MD;  Location: [facility];  Service: Orthopedics;  Laterality: Left;   HERNIA REPAIR     Esophagus   INCISION AND DRAINAGE OF WOUND Left 03/29/2023   Procedure: LEFT ANKLE WOUND IRRIGATION AND DEBRIDEMENT WOUND;  Surgeon: Amada Backer, MD;  Location: [facility];  Service: Orthopedics;  Laterality: Left;   JOINT REPLACEMENT     total- right partial- left   MASTECTOMY PARTIAL / LUMPECTOMY  2012   left   ORIF ANKLE FRACTURE Left 07/20/2018   Procedure: OPEN REDUCTION INTERNAL FIXATION (ORIF) ANKLE FRACTURE;  Surgeon: Amada Backer, MD;   Location: [facility] OR;  Service: Orthopedics;  Laterality: Left;   TOTAL KNEE ARTHROPLASTY Left 01/27/2019   Procedure: TOTAL KNEE ARTHROPLASTY;  Surgeon: Christie Cox, MD;  Location: [facility];  Service: Orthopedics;  Laterality: Left;   Patient Active Problem List   Diagnosis Date Noted   Goals of care, counseling/discussion 01/17/2022   Grief 01/17/2022   Secondary hypercoagulable state (HCC) 03/16/2021   Dizziness 03/10/2021   Orthostatic hypotension 10/15/2020   Presbycusis of both ears 03/08/2020   Mixed stress and urge urinary incontinence 12/02/2019   Macrocytosis 12/01/2019   Nutritional anemia 12/01/2019   S/P total knee replacement 01/27/2019   Recurrent left knee instability 07/05/2018   Respiratory failure with hypoxia (HCC) 08/30/2017   Hypoxemia    Heart failure with preserved ejection fraction (HCC), Grade 3 diastolic dysfunction 03/26/2017   PAF (paroxysmal atrial fibrillation) (HCC)    Dyspnea 03/19/2017   Encounter for preventive health examination 02/17/2016   Sensorineural hearing loss (SNHL), bilateral 01/26/2016   Hypomagnesemia 04/24/2014   Hemiparesis affecting left side as late effect of cerebrovascular accident (HCC) 04/24/2014   Nontraumatic cerebral hemorrhage (HCC) 04/30/2012   DM (diabetes mellitus) with complications (HCC)  03/04/2010   OSTEOPENIA 01/21/2009   UNSPECIFIED VITAMIN D  DEFICIENCY 11/19/2007   HYPERCHOLESTEROLEMIA 10/25/2006   GASTROESOPHAGEAL REFLUX, NO ESOPHAGITIS 10/25/2006   DIVERTICULOSIS OF COLON 10/25/2006   Osteoarthritis 10/25/2006   CERVICAL SPINE DISORDER, NOS 10/25/2006    ONSET DATE: 12/03/23- referral date  REFERRING DIAG: I69.354 (ICD-10-CM) - Hemiplegia and hemiparesis following cerebral infarction affecting left non-dominant side   THERAPY DIAG:  Muscle weakness (generalized)  Hemiplegia and hemiparesis following cerebral infarction affecting left non-dominant side (HCC)  Abnormal posture  Other lack of  coordination  Rationale for Evaluation and Treatment: Rehabilitation  SUBJECTIVE:   SUBJECTIVE STATEMENT: Pt reports not feeling well, she is on antibiotics for UTI Pt accompanied by: self  PERTINENT HISTORY: CVA 2013, CHF, HTN, A-fib, TKA, s/p ORIF left ankle hardware removal, s/p injection to bilateral shoulders 01/02/24  PRECAUTIONS: Fall, no therapy to bilateral shoulders per MD  WEIGHT BEARING RESTRICTIONS: No  PAIN:  Are you having pain? Yes: NPRS scale: 3-4/10 hand, shoulders 3-4/10 Pain location: bilateral shoulders, left wrist Pain description: aching, sharp Aggravating factors: movement, Relieving factors: rest  FALLS: Has patient fallen in last 6 months? No  LIVING ENVIRONMENT: Lives with: has caregiver 8-2, 4:30-8pm Lives in: House/apartment Stairs: No, has ramp Has following equipment at home: Walker - 2 wheeled, shower chair, and Ramped entry  PLOF: Needs assistance with homemaking, Needs assistance with transfers, ADLS, plays bridge, she has  an aide at home that helps with meals, going to MD's, dressing and bathing, is alone at night    PATIENT GOALS: improve functional use of LUE  OBJECTIVE:  Note: Objective measures were completed at Evaluation unless otherwise noted.  HAND DOMINANCE: Right  ADLs: Overall ADLs: requires assist Transfers/ambulation related to ADLs: Eating: needs assit with cutting food Grooming: set up UB Dressing: mod A/ max  LB Dressing: max  Toileting: has grab bar, min A Bathing: min-mod A Tub Shower transfers: min-mod A to rolling shower chair, then caregivers roll you    IADLs:caregiver's perform IADLS/ home management  Medication management: pt and caregiver complete together  Financial management: nephew handles  MOBILITY STATUS: Needs Assist: Pt requires min-mod A for transfers, pt does not walk with caregiver's  POSTURE COMMENTS:  rounded shoulders and forward head       UPPER EXTREMITY ROM:  RUE elbow wrist  and hand grossly WFL, shoulder flexion limited to 50* due to arthritis  Active ROM Right eval Left eval  Shoulder flexion  45  Shoulder abduction    Shoulder adduction    Shoulder extension    Shoulder internal rotation    Shoulder external rotation    Elbow flexion  WFL  Elbow extension  WFL  Wrist flexion  40  Wrist extension  30  Wrist ulnar deviation    Wrist radial deviation    Wrist pronation  WFL  Wrist supination  80%  (Blank rows = not tested)   HAND FUNCTION: Grip strength: Right: 30 lbs; Left: 10 lbs  COORDINATION: Box/ blocks LUE 13 blocks in 1 min   SENSATION: Light touch: Impaired     MUSCLE TONE: LUE: Mild and Hypertonic  COGNITION: Overall cognitive status: Within functional limits for tasks assessed    VISION ASSESSMENT:NT, wear glasses for distance vision  TREATMENT DATE:01/29/24- BP 107/74, HR 66, O2 95% Paraffin to L hand and wrist x 8 mins for stiffness, along with hotpack to neck and shoulders, no adverse reactions, mild pink at shoulders, neck pt reports no pain.. Pt was fitted with a comfort cool, thumb CMC splint for left hand for support and improved function. Therapist educated pt in donning so that she can instruct her caregivers. Pt demonstrates ability to remove splint independently Pt removed and replace wooden dowel pegs from pegboard while wearing splint with improved perfromance, min-mod drops due to sensory deficits, min-mod v.c, and facilitation.. Pt demonstrates improved tip pinch with index and thumb while wearing splint. Flipping playing cards with LUE wearing splint, min-mod difficulty due to fatigue.   A/ROM finger flexion,then finger extension,  LUE, min v.c   01/24/24-Paraffin to L hand and wrist x 9 mins for stiffness, along with hotpack to neck and shoulders, no adverse reactions. A/ROM  finger flexion,then finger extension,  LUE, min v.c  Reviewed yellow putty exercises for composite grip and then pt performed roll and fingertip pinch, min-mod difficulty for control ,  v.c  Fine motor coordination tasks, picking up marbles then checkers to place in container with min difficulty Flipping playing cards, min-mod dificulty then placing  removing wooden dowel pegs tehn replacing into pegboard mod difficulty. Therapist proved recommendations regarding comfort cool thumb CMC brace for LUE 01/22/23-Pt reports pain and stiffness due to the weather Paraffin to L hand and wrist x 9 mins for stiffness, along with hotpack to neck and shoulders, no adverse reactions. A/ROM finger flexion, opposition,wrist flexion extension, and ulnar / rdial deviation AA/ROM exercises for LUE, min v.c  Prayer stretch for wrist extension, min v.c. Joint mobs to thumb and digits. Fine motor coordination tasks, picking up coins to place in container with mod difficulty Flipping playing cards, min-mod dificulty then placing and removing wooden dowel pegs into pegboard.     PATIENT EDUCATION: Education details: splint wear, care and precations, wear x 1-2 hours at a time Person educated: Patient Education method: Programmer, multimedia, Demonstration, Verbal cues,  Education comprehension: verbalized understanding, returned demonstration, verbal cues required, and tactile cues required   HOME EXERCISE PROGRAM: 01/07/24 A/ROM, light functional use of LUE, cards, checkers yellow putty  GOALS: Goals reviewed with patient? Yes  SHORT TERM GOALS: Target date: 02/03/24  I with inital HEP.  Goal status: met, pt is performing at home. 01/22/24  2.  Pt will increase A/ROM wrist extension to 40* for improved functional use. Baseline: 30* Goal status: met 50* 01/17/24  3.  Pt will increase LUE box/ blocks score to 16 blocks Baseline: 13 blocks Goal status: ongoing 9 blocks 01/24/24  4.  Pt will increase A/ROM wrist  flexion to 50* for improved functional use.  Goal status met 50* 01/17/24  5.  Pt will increase LUE grip strength to 15 lbs for increased functional use. Baseline: RUE 30 lbs, LUE 10 lbs. Goal status: met 17 lbs 01/24/24  6.  Pt will verbalize understanding of positioning to minimize UE pain. Goal status: ongoing discussed pillow under LUE, avoiding reaching with UE's, thumb CMC splint fitted on 01/29/23  LONG TERM GOALS: Target date: 03/27/24  I with updated HEP  Goal status: INITIAL  2.  Pt will increase LUE box/ blocks score to 16 blocks  Goal status: INITIAL  3.  Pt will use LUE as a gross assist at least 15% of the time. Baseline: uses 5% Goal status: INITIAL  4.  Pt will  report decreased drops with LUE. Baseline: frequent drops Goal status: INITIAL 5. Pt will verbalize understanding of AE/DME to maximize safety/ I including possible walker splint prn.  Goal status: inital 6. Pt will increase LUE grip strength to 20 lbs for increased functional use. (new 01/24/24 since short term goal met.) Baseline: RUE 30 lbs, LUE 10 lbs. Goal status: updated/ revised goal ASSESSMENT:  CLINICAL IMPRESSION: Patient is progressing towards goals. Pt  demonstrates improved thumb positioning with comfort cool thumb splint for LUE as well as improved pinchPERFORMANCE DEFICITS: in functional skills including ADLs, IADLs, coordination, dexterity, sensation, tone, ROM, strength, flexibility, Fine motor control, Gross motor control, mobility, balance, endurance, decreased knowledge of precautions, decreased knowledge of use of DME, and UE functional use, cognitive skills and psychosocial skills including coping strategies, environmental adaptation, habits, interpersonal interactions, and routines and behaviors.   IMPAIRMENTS: are limiting patient from ADLs, IADLs, rest and sleep, play, leisure, and social participation.   CO-MORBIDITIES: may have co-morbidities  that affects occupational performance.  Patient will benefit from skilled OT to address above impairments and improve overall function.  MODIFICATION OR ASSISTANCE TO COMPLETE EVALUATION: No modification of tasks or assist necessary to complete an evaluation.  OT OCCUPATIONAL PROFILE AND HISTORY: Detailed assessment: Review of records and additional review of physical, cognitive, psychosocial history related to current functional performance.  CLINICAL DECISION MAKING: LOW - limited treatment options, no task modification necessary  REHAB POTENTIAL: Good  EVALUATION COMPLEXITY: Low    PLAN:  OT FREQUENCY: 2x/week  OT DURATION: 12 weeks  PLANNED INTERVENTIONS: 97168 OT Re-evaluation, 97535 self care/ADL training, 81191 therapeutic exercise, 97530 therapeutic activity, 97112 neuromuscular re-education, 97140 manual therapy, 97035 ultrasound, 97018 paraffin, 47829 moist heat, 97010 cryotherapy, 97034 contrast bath, 97014 electrical stimulation unattended, passive range of motion, balance training, functional mobility training, psychosocial skills training, energy conservation, coping strategies training, patient/family education, and DME and/or AE instructions  RECOMMENDED OTHER SERVICES: n/a  CONSULTED AND AGREED WITH PLAN OF CARE: Patient  PLAN FOR NEXT SESSION:  check on splint wear, LUE functional use, consider walker splint   Yuri Flener, OT 01/29/2024, 11:15 AM

## 2024-01-31 ENCOUNTER — Encounter: Payer: Self-pay | Admitting: Occupational Therapy

## 2024-01-31 ENCOUNTER — Ambulatory Visit: Admitting: Occupational Therapy

## 2024-01-31 DIAGNOSIS — M6281 Muscle weakness (generalized): Secondary | ICD-10-CM

## 2024-01-31 DIAGNOSIS — R270 Ataxia, unspecified: Secondary | ICD-10-CM

## 2024-01-31 DIAGNOSIS — R293 Abnormal posture: Secondary | ICD-10-CM | POA: Diagnosis not present

## 2024-01-31 DIAGNOSIS — I69354 Hemiplegia and hemiparesis following cerebral infarction affecting left non-dominant side: Secondary | ICD-10-CM | POA: Diagnosis not present

## 2024-01-31 DIAGNOSIS — R278 Other lack of coordination: Secondary | ICD-10-CM | POA: Diagnosis not present

## 2024-01-31 NOTE — Therapy (Signed)
 OUTPATIENT OCCUPATIONAL THERAPY NEURO Treatment  Patient Name: Karen Jackson MRN: [id number] DOB:09/27/1940, 83 y.o., female Today's Date: 01/31/2024  PCP: Dr. Genelle Kennedy REFERRING PROVIDER: Dr. Genelle Kennedy  END OF SESSION:  OT End of Session - 01/31/24 1116     Visit Number 9    Number of Visits 25    Date for OT Re-Evaluation 03/27/24    Authorization Type MCR and  [employer]    Authorization Time Period 12 weeks    Authorization - Visit Number 9    Progress Note Due on Visit 10    OT Start Time 1103    OT Stop Time 1143    OT Time Calculation (min) 40 min                    Past Medical History:  Diagnosis Date   Acute cystitis without hematuria    Acute diastolic CHF (congestive heart failure) (HCC)    Arthritis    Dyspnea    Dysrhythmia    Fever of unknown origin 03/19/2017   Hyperlipidemia    Hypertension    denies at preop   Multifocal pneumonia    Neuromuscular disorder (HCC)    neuropathy left arm and foot   Osteopenia    Paralysis (HCC)    partial left side from CVA    Persistent atrial fibrillation (HCC)    PONV (postoperative nausea and vomiting)    Pre-diabetes    Stroke [facility]) 2013   hemmorahgic   Past Surgical History:  Procedure Laterality Date   ANKLE SURGERY     APPENDECTOMY     CHOLECYSTECTOMY     HARDWARE REMOVAL Left 03/29/2023   Procedure: HARDWARE REMOVAL;  Surgeon: Amada Backer, MD;  Location: [facility];  Service: Orthopedics;  Laterality: Left;   HERNIA REPAIR     Esophagus   INCISION AND DRAINAGE OF WOUND Left 03/29/2023   Procedure: LEFT ANKLE WOUND IRRIGATION AND DEBRIDEMENT WOUND;  Surgeon: Amada Backer, MD;  Location: [facility];  Service: Orthopedics;  Laterality: Left;   JOINT REPLACEMENT     total- right partial- left   MASTECTOMY PARTIAL / LUMPECTOMY  2012   left   ORIF ANKLE FRACTURE Left 07/20/2018   Procedure: OPEN REDUCTION INTERNAL FIXATION (ORIF) ANKLE FRACTURE;  Surgeon: Amada Backer, MD;   Location: [facility];  Service: Orthopedics;  Laterality: Left;   TOTAL KNEE ARTHROPLASTY Left 01/27/2019   Procedure: TOTAL KNEE ARTHROPLASTY;  Surgeon: Christie Cox, MD;  Location: [facility];  Service: Orthopedics;  Laterality: Left;   Patient Active Problem List   Diagnosis Date Noted   Goals of care, counseling/discussion 01/17/2022   Grief 01/17/2022   Secondary hypercoagulable state (HCC) 03/16/2021   Dizziness 03/10/2021   Orthostatic hypotension 10/15/2020   Presbycusis of both ears 03/08/2020   Mixed stress and urge urinary incontinence 12/02/2019   Macrocytosis 12/01/2019   Nutritional anemia 12/01/2019   S/P total knee replacement 01/27/2019   Recurrent left knee instability 07/05/2018   Respiratory failure with hypoxia (HCC) 08/30/2017   Hypoxemia    Heart failure with preserved ejection fraction (HCC), Grade 3 diastolic dysfunction 03/26/2017   PAF (paroxysmal atrial fibrillation) (HCC)    Dyspnea 03/19/2017   Encounter for preventive health examination 02/17/2016   Sensorineural hearing loss (SNHL), bilateral 01/26/2016   Hypomagnesemia 04/24/2014   Hemiparesis affecting left side as late effect of cerebrovascular accident (HCC) 04/24/2014   Nontraumatic cerebral hemorrhage (HCC) 04/30/2012   DM (diabetes mellitus) with complications (  HCC) 03/04/2010   OSTEOPENIA 01/21/2009   UNSPECIFIED VITAMIN D  DEFICIENCY 11/19/2007   HYPERCHOLESTEROLEMIA 10/25/2006   GASTROESOPHAGEAL REFLUX, NO ESOPHAGITIS 10/25/2006   DIVERTICULOSIS OF COLON 10/25/2006   Osteoarthritis 10/25/2006   CERVICAL SPINE DISORDER, NOS 10/25/2006    ONSET DATE: 12/03/23- referral date  REFERRING DIAG: I69.354 (ICD-10-CM) - Hemiplegia and hemiparesis following cerebral infarction affecting left non-dominant side   THERAPY DIAG:  Muscle weakness (generalized)  Hemiplegia and hemiparesis following cerebral infarction affecting left non-dominant side (HCC)  Abnormal posture  Other lack of  coordination  Ataxia  Rationale for Evaluation and Treatment: Rehabilitation  SUBJECTIVE:   SUBJECTIVE STATEMENT: Pt reports feeling better today, she has finished the antibiotics Pt accompanied by: self  PERTINENT HISTORY: CVA 2013, CHF, HTN, A-fib, TKA, s/p ORIF left ankle hardware removal, s/p injection to bilateral shoulders 01/02/24  PRECAUTIONS: Fall, no therapy to bilateral shoulders per MD  WEIGHT BEARING RESTRICTIONS: No  PAIN:  Are you having pain? Yes: NPRS scale: 5/10 thumb/ hand, shoulders 5/10 Pain location: bilateral shoulders, left wrist Pain description: aching, sharp Aggravating factors: movement, Relieving factors: rest  FALLS: Has patient fallen in last 6 months? No  LIVING ENVIRONMENT: Lives with: has caregiver 8-2, 4:30-8pm Lives in: House/apartment Stairs: No, has ramp Has following equipment at home: Walker - 2 wheeled, shower chair, and Ramped entry  PLOF: Needs assistance with homemaking, Needs assistance with transfers, ADLS, plays bridge, she has  an aide at home that helps with meals, going to MD's, dressing and bathing, is alone at night    PATIENT GOALS: improve functional use of LUE  OBJECTIVE:  Note: Objective measures were completed at Evaluation unless otherwise noted.  HAND DOMINANCE: Right  ADLs: Overall ADLs: requires assist Transfers/ambulation related to ADLs: Eating: needs assit with cutting food Grooming: set up UB Dressing: mod A/ max  LB Dressing: max  Toileting: has grab bar, min A Bathing: min-mod A Tub Shower transfers: min-mod A to rolling shower chair, then caregivers roll you    IADLs:caregiver's perform IADLS/ home management  Medication management: pt and caregiver complete together  Financial management: nephew handles  MOBILITY STATUS: Needs Assist: Pt requires min-mod A for transfers, pt does not walk with caregiver's  POSTURE COMMENTS:  rounded shoulders and forward head       UPPER EXTREMITY  ROM:  RUE elbow wrist and hand grossly WFL, shoulder flexion limited to 50* due to arthritis  Active ROM Right eval Left eval  Shoulder flexion  45  Shoulder abduction    Shoulder adduction    Shoulder extension    Shoulder internal rotation    Shoulder external rotation    Elbow flexion  WFL  Elbow extension  WFL  Wrist flexion  40  Wrist extension  30  Wrist ulnar deviation    Wrist radial deviation    Wrist pronation  WFL  Wrist supination  80%  (Blank rows = not tested)   HAND FUNCTION: Grip strength: Right: 30 lbs; Left: 10 lbs  COORDINATION: Box/ blocks LUE 13 blocks in 1 min   SENSATION: Light touch: Impaired     MUSCLE TONE: LUE: Mild and Hypertonic  COGNITION: Overall cognitive status: Within functional limits for tasks assessed    VISION ASSESSMENT:NT, wear glasses for distance vision  TREATMENT DATE:01/31/24-Paraffin to L hand and wrist x 8 mins for stiffness, along with hotpack to neck and shoulders, no adverse reactions, Finger thumb abduction, finger thumb opposition with improved performance. Pt's neoprene thumb splint is fitting well and was reapplied. fine motor coordiantion activities to focus on control and functional use of LUE: placing large pegs into foam pegboard, min facilitation, v.c- removeing from dycem, cues for 3 pt pinch, tip pinch Flipping playing cards on low surface, min difficulty vc. Picking up blocks as well as checks, min difficulty v.c to place in container Velcro roller for key grip, x 4 reps, min difficulty/ v.c  01/29/24- BP 107/74, HR 66, O2 95% Paraffin to L hand and wrist x 8 mins for stiffness, along with hotpack to neck and shoulders, no adverse reactions, mild pink at shoulders, neck pt reports no pain.. Pt was fitted with a comfort cool, thumb CMC splint for left hand for support and  improved function. Therapist educated pt in donning so that she can instruct her caregivers. Pt demonstrates ability to remove splint independently Pt removed and replace wooden dowel pegs from pegboard while wearing splint with improved perfromance, min-mod drops due to sensory deficits, min-mod v.c, and facilitation.. Pt demonstrates improved tip pinch with index and thumb while wearing splint. Flipping playing cards with LUE wearing splint, min-mod difficulty due to fatigue.   A/ROM finger flexion,then finger extension,  LUE, min v.c   01/24/24-Paraffin to L hand and wrist x 9 mins for stiffness, along with hotpack to neck and shoulders, no adverse reactions. A/ROM finger flexion,then finger extension,  LUE, min v.c  Reviewed yellow putty exercises for composite grip and then pt performed roll and fingertip pinch, min-mod difficulty for control ,  v.c  Fine motor coordination tasks, picking up marbles then checkers to place in container with min difficulty Flipping playing cards, min-mod dificulty then placing  removing wooden dowel pegs tehn replacing into pegboard mod difficulty. Therapist proved recommendations regarding comfort cool thumb CMC brace for LUE 01/22/23-Pt reports pain and stiffness due to the weather Paraffin to L hand and wrist x 9 mins for stiffness, along with hotpack to neck and shoulders, no adverse reactions. A/ROM finger flexion, opposition,wrist flexion extension, and ulnar / rdial deviation AA/ROM exercises for LUE, min v.c  Prayer stretch for wrist extension, min v.c. Joint mobs to thumb and digits. Fine motor coordination tasks, picking up coins to place in container with mod difficulty Flipping playing cards, min-mod dificulty then placing and removing wooden dowel pegs into pegboard.     PATIENT EDUCATION: Education details: splint wear, care and precations, wear  increasing wear time during day Person educated: Patient Education method: Explanation,  Demonstration, Verbal cues,  Education comprehension: verbalized understanding, returned demonstration, verbal cues required, and tactile cues required   HOME EXERCISE PROGRAM: 01/07/24 A/ROM, light functional use of LUE, cards, checkers yellow putty  GOALS: Goals reviewed with patient? Yes  SHORT TERM GOALS: Target date: 02/03/24  I with inital HEP.  Goal status: met, pt is performing at home. 01/22/24  2.  Pt will increase A/ROM wrist extension to 40* for improved functional use. Baseline: 30* Goal status: met 50* 01/17/24  3.  Pt will increase LUE box/ blocks score to 16 blocks Baseline: 13 blocks Goal status: ongoing 9 blocks 01/24/24  4.  Pt will increase A/ROM wrist flexion to 50* for improved functional use.  Goal status met 50* 01/17/24  5.  Pt will increase LUE grip strength to 15 lbs for  increased functional use. Baseline: RUE 30 lbs, LUE 10 lbs. Goal status: met 17 lbs 01/24/24  6.  Pt will verbalize understanding of positioning to minimize UE pain. Goal status: ongoing discussed pillow under LUE, avoiding reaching with UE's, thumb CMC splint fitted on 01/29/23  LONG TERM GOALS: Target date: 03/27/24  I with updated HEP  Goal status: INITIAL  2.  Pt will increase LUE box/ blocks score to 16 blocks  Goal status: INITIAL  3.  Pt will use LUE as a gross assist at least 15% of the time. Baseline: uses 5% Goal status: INITIAL  4.  Pt will report decreased drops with LUE. Baseline: frequent drops Goal status: INITIAL 5. Pt will verbalize understanding of AE/DME to maximize safety/ I including possible walker splint prn.  Goal status: inital 6. Pt will increase LUE grip strength to 20 lbs for increased functional use. (new 01/24/24 since short term goal met.) Baseline: RUE 30 lbs, LUE 10 lbs. Goal status: updated/ revised goal ASSESSMENT:  CLINICAL IMPRESSION: Patient is progressing towards goals. Pt  demonstrates improved functional use of LUE whie wearing thumb  splint. she emonstrates improved ooposition and less Drops. PERFORMANCE DEFICITS: in functional skills including ADLs, IADLs, coordination, dexterity, sensation, tone, ROM, strength, flexibility, Fine motor control, Gross motor control, mobility, balance, endurance, decreased knowledge of precautions, decreased knowledge of use of DME, and UE functional use, cognitive skills and psychosocial skills including coping strategies, environmental adaptation, habits, interpersonal interactions, and routines and behaviors.   IMPAIRMENTS: are limiting patient from ADLs, IADLs, rest and sleep, play, leisure, and social participation.   CO-MORBIDITIES: may have co-morbidities  that affects occupational performance. Patient will benefit from skilled OT to address above impairments and improve overall function.  MODIFICATION OR ASSISTANCE TO COMPLETE EVALUATION: No modification of tasks or assist necessary to complete an evaluation.  OT OCCUPATIONAL PROFILE AND HISTORY: Detailed assessment: Review of records and additional review of physical, cognitive, psychosocial history related to current functional performance.  CLINICAL DECISION MAKING: LOW - limited treatment options, no task modification necessary  REHAB POTENTIAL: Good  EVALUATION COMPLEXITY: Low    PLAN:  OT FREQUENCY: 2x/week  OT DURATION: 12 weeks  PLANNED INTERVENTIONS: 97168 OT Re-evaluation, 97535 self care/ADL training, 16109 therapeutic exercise, 97530 therapeutic activity, 97112 neuromuscular re-education, 97140 manual therapy, 97035 ultrasound, 97018 paraffin, 60454 moist heat, 97010 cryotherapy, 97034 contrast bath, 97014 electrical stimulation unattended, passive range of motion, balance training, functional mobility training, psychosocial skills training, energy conservation, coping strategies training, patient/family education, and DME and/or AE instructions  RECOMMENDED OTHER SERVICES: n/a  CONSULTED AND AGREED WITH PLAN OF  CARE: Patient  PLAN FOR NEXT SESSION:  A/ROM, check box/ blocks,  LUE functional use, consider walker splint   Venisha Boehning, OT 01/31/2024, 11:17 AM

## 2024-02-12 ENCOUNTER — Encounter: Payer: Self-pay | Admitting: Occupational Therapy

## 2024-02-12 ENCOUNTER — Ambulatory Visit: Admitting: Occupational Therapy

## 2024-02-12 DIAGNOSIS — R293 Abnormal posture: Secondary | ICD-10-CM

## 2024-02-12 DIAGNOSIS — I69354 Hemiplegia and hemiparesis following cerebral infarction affecting left non-dominant side: Secondary | ICD-10-CM | POA: Diagnosis not present

## 2024-02-12 DIAGNOSIS — R270 Ataxia, unspecified: Secondary | ICD-10-CM | POA: Diagnosis not present

## 2024-02-12 DIAGNOSIS — R278 Other lack of coordination: Secondary | ICD-10-CM

## 2024-02-12 DIAGNOSIS — M6281 Muscle weakness (generalized): Secondary | ICD-10-CM | POA: Diagnosis not present

## 2024-02-12 NOTE — Therapy (Signed)
 OUTPATIENT OCCUPATIONAL THERAPY NEURO Treatment  Patient Name: Karen Jackson MRN: [id number] DOB:August 23, 1941, 83 y.o., female Today's Date: 02/12/2024  PCP: Dr. Genelle Kennedy REFERRING PROVIDER: Dr. Genelle Kennedy  END OF SESSION:  OT End of Session - 02/12/24 1114     Visit Number 10    Number of Visits 25    Date for OT Re-Evaluation 03/27/24    Authorization Type MCR and  [employer]    Authorization Time Period 12 weeks    Authorization - Visit Number 10    Progress Note Due on Visit 10    OT Start Time 1104    OT Stop Time 1144    OT Time Calculation (min) 40 min                 Past Medical History:  Diagnosis Date   Acute cystitis without hematuria    Acute diastolic CHF (congestive heart failure) (HCC)    Arthritis    Dyspnea    Dysrhythmia    Fever of unknown origin 03/19/2017   Hyperlipidemia    Hypertension    denies at preop   Multifocal pneumonia    Neuromuscular disorder (HCC)    neuropathy left arm and foot   Osteopenia    Paralysis (HCC)    partial left side from CVA    Persistent atrial fibrillation (HCC)    PONV (postoperative nausea and vomiting)    Pre-diabetes    Stroke [facility]) 2013   hemmorahgic   Past Surgical History:  Procedure Laterality Date   ANKLE SURGERY     APPENDECTOMY     CHOLECYSTECTOMY     HARDWARE REMOVAL Left 03/29/2023   Procedure: HARDWARE REMOVAL;  Surgeon: Amada Backer, MD;  Location: [facility];  Service: Orthopedics;  Laterality: Left;   HERNIA REPAIR     Esophagus   INCISION AND DRAINAGE OF WOUND Left 03/29/2023   Procedure: LEFT ANKLE WOUND IRRIGATION AND DEBRIDEMENT WOUND;  Surgeon: Amada Backer, MD;  Location: [facility];  Service: Orthopedics;  Laterality: Left;   JOINT REPLACEMENT     total- right partial- left   MASTECTOMY PARTIAL / LUMPECTOMY  2012   left   ORIF ANKLE FRACTURE Left 07/20/2018   Procedure: OPEN REDUCTION INTERNAL FIXATION (ORIF) ANKLE FRACTURE;  Surgeon: Amada Backer, MD;   Location: [facility];  Service: Orthopedics;  Laterality: Left;   TOTAL KNEE ARTHROPLASTY Left 01/27/2019   Procedure: TOTAL KNEE ARTHROPLASTY;  Surgeon: Christie Cox, MD;  Location: [facility];  Service: Orthopedics;  Laterality: Left;   Patient Active Problem List   Diagnosis Date Noted   Goals of care, counseling/discussion 01/17/2022   Grief 01/17/2022   Secondary hypercoagulable state (HCC) 03/16/2021   Dizziness 03/10/2021   Orthostatic hypotension 10/15/2020   Presbycusis of both ears 03/08/2020   Mixed stress and urge urinary incontinence 12/02/2019   Macrocytosis 12/01/2019   Nutritional anemia 12/01/2019   S/P total knee replacement 01/27/2019   Recurrent left knee instability 07/05/2018   Respiratory failure with hypoxia (HCC) 08/30/2017   Hypoxemia    Heart failure with preserved ejection fraction (HCC), Grade 3 diastolic dysfunction 03/26/2017   PAF (paroxysmal atrial fibrillation) (HCC)    Dyspnea 03/19/2017   Encounter for preventive health examination 02/17/2016   Sensorineural hearing loss (SNHL), bilateral 01/26/2016   Hypomagnesemia 04/24/2014   Hemiparesis affecting left side as late effect of cerebrovascular accident (HCC) 04/24/2014   Nontraumatic cerebral hemorrhage (HCC) 04/30/2012   DM (diabetes mellitus) with complications (HCC) 03/04/2010  OSTEOPENIA 01/21/2009   UNSPECIFIED VITAMIN D  DEFICIENCY 11/19/2007   HYPERCHOLESTEROLEMIA 10/25/2006   GASTROESOPHAGEAL REFLUX, NO ESOPHAGITIS 10/25/2006   DIVERTICULOSIS OF COLON 10/25/2006   Osteoarthritis 10/25/2006   CERVICAL SPINE DISORDER, NOS 10/25/2006    ONSET DATE: 12/03/23- referral date  REFERRING DIAG: I69.354 (ICD-10-CM) - Hemiplegia and hemiparesis following cerebral infarction affecting left non-dominant side   THERAPY DIAG:  No diagnosis found.  Rationale for Evaluation and Treatment: Rehabilitation  SUBJECTIVE:   SUBJECTIVE STATEMENT: Pt  reports her shoulder and hand are bothering her today due to  the weather Pt accompanied by: self  PERTINENT HISTORY: CVA 2013, CHF, HTN, A-fib, TKA, s/p ORIF left ankle hardware removal, s/p injection to bilateral shoulders 01/02/24  PRECAUTIONS: Fall, no therapy to bilateral shoulders per MD  WEIGHT BEARING RESTRICTIONS: No  PAIN:  Are you having pain? Yes: NPRS scale: 6/10 thumb/ hand, shoulders 7/10 Pain location: bilateral shoulders, left wrist Pain description: aching, sharp Aggravating factors: movement, Relieving factors: rest  FALLS: Has patient fallen in last 6 months? No  LIVING ENVIRONMENT: Lives with: has caregiver 8-2, 4:30-8pm Lives in: House/apartment Stairs: No, has ramp Has following equipment at home: Walker - 2 wheeled, shower chair, and Ramped entry  PLOF: Needs assistance with homemaking, Needs assistance with transfers, ADLS, plays bridge, she has  an aide at home that helps with meals, going to MD's, dressing and bathing, is alone at night    PATIENT GOALS: improve functional use of LUE  OBJECTIVE:  Note: Objective measures were completed at Evaluation unless otherwise noted.  HAND DOMINANCE: Right  ADLs: Overall ADLs: requires assist Transfers/ambulation related to ADLs: Eating: needs assit with cutting food Grooming: set up UB Dressing: mod A/ max  LB Dressing: max  Toileting: has grab bar, min A Bathing: min-mod A Tub Shower transfers: min-mod A to rolling shower chair, then caregivers roll you    IADLs:caregiver's perform IADLS/ home management  Medication management: pt and caregiver complete together  Financial management: nephew handles  MOBILITY STATUS: Needs Assist: Pt requires min-mod A for transfers, pt does not walk with caregiver's  POSTURE COMMENTS:  rounded shoulders and forward head       UPPER EXTREMITY ROM:  RUE elbow wrist and hand grossly WFL, shoulder flexion limited to 50* due to arthritis  Active ROM Right eval Left eval  Shoulder flexion  45  Shoulder abduction     Shoulder adduction    Shoulder extension    Shoulder internal rotation    Shoulder external rotation    Elbow flexion  WFL  Elbow extension  WFL  Wrist flexion  40  Wrist extension  30  Wrist ulnar deviation    Wrist radial deviation    Wrist pronation  WFL  Wrist supination  80%  (Blank rows = not tested)   HAND FUNCTION: Grip strength: Right: 30 lbs; Left: 10 lbs  COORDINATION: Box/ blocks LUE 13 blocks in 1 min   SENSATION: Light touch: Impaired     MUSCLE TONE: LUE: Mild and Hypertonic  COGNITION: Overall cognitive status: Within functional limits for tasks assessed    VISION ASSESSMENT:NT, wear glasses for distance vision  TREATMENT DATE:6/17/25Paraffin to L hand and wrist x 8 mins for stiffness, along with hotpack to neck and shoulders, no adverse reactions, Pt forgot her splint today but she reports it is fitting well. placing large pegs into foam pegboard, for fine motor coordination, mod difficulty without splint, min facilitation, v.c- removing from pegboard with min difficulty Velcro roller for key grip, x 4 reps, min difficulty/ v.c Flipping playing cards on low surface, min difficulty vc. Therpist checked box and blocks today see below. Improved however pt has not fully met goal. Picking up checkers, min difficulty v.c to place in container Velcro roller for key grip, x 4 reps, min difficulty/ v.c   01/31/24-Paraffin to L hand and wrist x 8 mins for stiffness, along with hotpack to neck and shoulders, no adverse reactions, Finger thumb abduction, finger thumb opposition with improved performance. Pt's neoprene thumb splint is fitting well and was reapplied. fine motor coordiantion activities to focus on control and functional use of LUE: placing large pegs into foam pegboard, min facilitation, v.c- removeing from dycem,  cues for 3 pt pinch, tip pinch Flipping playing cards on low surface, min difficulty vc. Picking up blocks as well as checks, min difficulty v.c to place in container Velcro roller for key grip, x 4 reps, min difficulty/ v.c    PATIENT EDUCATION: Education details:  see above Person educated: Patient Education method: Programmer, multimedia, Demonstration, Verbal cues,  Education comprehension: verbalized understanding, returned demonstration, verbal cues required, and tactile cues required   HOME EXERCISE PROGRAM: 01/07/24 A/ROM, light functional use of LUE, cards, checkers yellow putty  GOALS: Goals reviewed with patient? Yes  SHORT TERM GOALS: Target date: 02/03/24  I with inital HEP.  Goal status: met, pt is performing at home. 01/22/24  2.  Pt will increase A/ROM wrist extension to 40* for improved functional use. Baseline: 30* Goal status: met 50* 01/17/24  3.  Pt will increase LUE box/ blocks score to 16 blocks Baseline: 13 blocks Goal status: ongoing 9 blocks 01/24/24, 14 blocks improved but not met 02/12/24  4.  Pt will increase A/ROM wrist flexion to 50* for improved functional use.  Goal status met 50* 01/17/24  5.  Pt will increase LUE grip strength to 15 lbs for increased functional use. Baseline: RUE 30 lbs, LUE 10 lbs. Goal status: met 17 lbs 01/24/24  6.  Pt will verbalize understanding of positioning to minimize UE pain. Goal status: ongoing discussed pillow under LUE, avoiding reaching with UE's, thumb [facility] splint fitted on 02/12/23  LONG TERM GOALS: Target date: 03/27/24  I with updated HEP  Goal status: ongoing, 02/11/24  2.  Pt will increase LUE box/ blocks score to 16 blocks  Goal status: INITIAL  3.  Pt will use LUE as a gross assist at least 15% of the time. Baseline: uses 5% Goal status: INITIAL  4.  Pt will report decreased drops with LUE. Baseline: frequent drops Goal status: INITIAL 5. Pt will verbalize understanding of AE/DME to maximize safety/ I  including possible walker splint prn.  Goal status: inital 6. Pt will increase LUE grip strength to 20 lbs for increased functional use. (new 01/24/24 since short term goal met.) Baseline: RUE 30 lbs, LUE 10 lbs. Goal status: updated/ revised goal ASSESSMENT:  CLINICAL IMPRESSION: For the reporting period of: 01/03/24-02/12/24 Pt has met 4/6 short term goals. Pt is progressing towards remaining goals.She demonstrates improved ROM, strength and functional use. Pt can benefit from continued to skilled occupational therapy to address  pt's LUE weakness and coordination deficits, in order to maximize pt's safety and I with ADLs/IADLs.  PERFORMANCE DEFICITS: in functional skills including ADLs, IADLs, coordination, dexterity, sensation, tone, ROM, strength, flexibility, Fine motor control, Gross motor control, mobility, balance, endurance, decreased knowledge of precautions, decreased knowledge of use of DME, and UE functional use, cognitive skills and psychosocial skills including coping strategies, environmental adaptation, habits, interpersonal interactions, and routines and behaviors.   IMPAIRMENTS: are limiting patient from ADLs, IADLs, rest and sleep, play, leisure, and social participation.   CO-MORBIDITIES: may have co-morbidities  that affects occupational performance. Patient will benefit from skilled OT to address above impairments and improve overall function.  MODIFICATION OR ASSISTANCE TO COMPLETE EVALUATION: No modification of tasks or assist necessary to complete an evaluation.  OT OCCUPATIONAL PROFILE AND HISTORY: Detailed assessment: Review of records and additional review of physical, cognitive, psychosocial history related to current functional performance.  CLINICAL DECISION MAKING: LOW - limited treatment options, no task modification necessary  REHAB POTENTIAL: Good  EVALUATION COMPLEXITY: Low    PLAN:  OT FREQUENCY: 2x/week  OT DURATION: 12 weeks  PLANNED INTERVENTIONS:  97168 OT Re-evaluation, 97535 self care/ADL training, 16109 therapeutic exercise, 97530 therapeutic activity, 97112 neuromuscular re-education, 97140 manual therapy, 97035 ultrasound, 97018 paraffin, 60454 moist heat, 97010 cryotherapy, 97034 contrast bath, 97014 electrical stimulation unattended, passive range of motion, balance training, functional mobility training, psychosocial skills training, energy conservation, coping strategies training, patient/family education, and DME and/or AE instructions  RECOMMENDED OTHER SERVICES: n/a  CONSULTED AND AGREED WITH PLAN OF CARE: Patient  PLAN FOR NEXT SESSION:  A/ROM, work towards unmet goals,,  LUE functional use, consider walker splint   Alayzha An, OT 02/12/2024, 11:15 AM

## 2024-02-14 ENCOUNTER — Ambulatory Visit: Admitting: Occupational Therapy

## 2024-02-14 ENCOUNTER — Encounter: Payer: Self-pay | Admitting: Occupational Therapy

## 2024-02-14 DIAGNOSIS — R278 Other lack of coordination: Secondary | ICD-10-CM | POA: Diagnosis not present

## 2024-02-14 DIAGNOSIS — I69354 Hemiplegia and hemiparesis following cerebral infarction affecting left non-dominant side: Secondary | ICD-10-CM

## 2024-02-14 DIAGNOSIS — R270 Ataxia, unspecified: Secondary | ICD-10-CM | POA: Diagnosis not present

## 2024-02-14 DIAGNOSIS — M6281 Muscle weakness (generalized): Secondary | ICD-10-CM | POA: Diagnosis not present

## 2024-02-14 DIAGNOSIS — R293 Abnormal posture: Secondary | ICD-10-CM

## 2024-02-14 DIAGNOSIS — Z23 Encounter for immunization: Secondary | ICD-10-CM | POA: Diagnosis not present

## 2024-02-14 NOTE — Therapy (Signed)
 OUTPATIENT OCCUPATIONAL THERAPY NEURO Treatment  Patient Name: Karen Jackson MRN: [id number] DOB:October 07, 1940, 83 y.o., female Today's Date: 02/14/2024  PCP: Dr. Genelle Kennedy REFERRING PROVIDER: Dr. Genelle Kennedy  END OF SESSION:  OT End of Session - 02/14/24 1200     Visit Number 11    Number of Visits 25    Date for OT Re-Evaluation 03/27/24    Authorization Type MCR and  [employer]    Authorization Time Period 12 weeks    Authorization - Visit Number 11    Progress Note Due on Visit 20    OT Start Time 1023    OT Stop Time 1100    OT Time Calculation (min) 37 min                  Past Medical History:  Diagnosis Date   Acute cystitis without hematuria    Acute diastolic CHF (congestive heart failure) (HCC)    Arthritis    Dyspnea    Dysrhythmia    Fever of unknown origin 03/19/2017   Hyperlipidemia    Hypertension    denies at preop   Multifocal pneumonia    Neuromuscular disorder (HCC)    neuropathy left arm and foot   Osteopenia    Paralysis (HCC)    partial left side from CVA    Persistent atrial fibrillation (HCC)    PONV (postoperative nausea and vomiting)    Pre-diabetes    Stroke [facility]) 2013   hemmorahgic   Past Surgical History:  Procedure Laterality Date   ANKLE SURGERY     APPENDECTOMY     CHOLECYSTECTOMY     HARDWARE REMOVAL Left 03/29/2023   Procedure: HARDWARE REMOVAL;  Surgeon: Amada Backer, MD;  Location: [facility];  Service: Orthopedics;  Laterality: Left;   HERNIA REPAIR     Esophagus   INCISION AND DRAINAGE OF WOUND Left 03/29/2023   Procedure: LEFT ANKLE WOUND IRRIGATION AND DEBRIDEMENT WOUND;  Surgeon: Amada Backer, MD;  Location:  SURGERY CENTER;  Service: Orthopedics;  Laterality: Left;   JOINT REPLACEMENT     total- right partial- left   MASTECTOMY PARTIAL / LUMPECTOMY  2012   left   ORIF ANKLE FRACTURE Left 07/20/2018   Procedure: OPEN REDUCTION INTERNAL FIXATION (ORIF) ANKLE FRACTURE;  Surgeon: Amada Backer, MD;   Location: [facility] OR;  Service: Orthopedics;  Laterality: Left;   TOTAL KNEE ARTHROPLASTY Left 01/27/2019   Procedure: TOTAL KNEE ARTHROPLASTY;  Surgeon: Christie Cox, MD;  Location: [facility];  Service: Orthopedics;  Laterality: Left;   Patient Active Problem List   Diagnosis Date Noted   Goals of care, counseling/discussion 01/17/2022   Grief 01/17/2022   Secondary hypercoagulable state (HCC) 03/16/2021   Dizziness 03/10/2021   Orthostatic hypotension 10/15/2020   Presbycusis of both ears 03/08/2020   Mixed stress and urge urinary incontinence 12/02/2019   Macrocytosis 12/01/2019   Nutritional anemia 12/01/2019   S/P total knee replacement 01/27/2019   Recurrent left knee instability 07/05/2018   Respiratory failure with hypoxia (HCC) 08/30/2017   Hypoxemia    Heart failure with preserved ejection fraction (HCC), Grade 3 diastolic dysfunction 03/26/2017   PAF (paroxysmal atrial fibrillation) (HCC)    Dyspnea 03/19/2017   Encounter for preventive health examination 02/17/2016   Sensorineural hearing loss (SNHL), bilateral 01/26/2016   Hypomagnesemia 04/24/2014   Hemiparesis affecting left side as late effect of cerebrovascular accident (HCC) 04/24/2014   Nontraumatic cerebral hemorrhage (HCC) 04/30/2012   DM (diabetes mellitus) with complications (HCC) 03/04/2010  OSTEOPENIA 01/21/2009   UNSPECIFIED VITAMIN D  DEFICIENCY 11/19/2007   HYPERCHOLESTEROLEMIA 10/25/2006   GASTROESOPHAGEAL REFLUX, NO ESOPHAGITIS 10/25/2006   DIVERTICULOSIS OF COLON 10/25/2006   Osteoarthritis 10/25/2006   CERVICAL SPINE DISORDER, NOS 10/25/2006    ONSET DATE: 12/03/23- referral date  REFERRING DIAG: I69.354 (ICD-10-CM) - Hemiplegia and hemiparesis following cerebral infarction affecting left non-dominant side   THERAPY DIAG:  Muscle weakness (generalized)  Hemiplegia and hemiparesis following cerebral infarction affecting left non-dominant side (HCC)  Abnormal posture  Other lack of  coordination  Ataxia  Rationale for Evaluation and Treatment: Rehabilitation  SUBJECTIVE:   SUBJECTIVE STATEMENT: Pt  reports her shoulder and hand are bothering her today due to the weather Pt accompanied by: self  PERTINENT HISTORY: CVA 2013, CHF, HTN, A-fib, TKA, s/p ORIF left ankle hardware removal, s/p injection to bilateral shoulders 01/02/24  PRECAUTIONS: Fall, no therapy to bilateral shoulders per MD  WEIGHT BEARING RESTRICTIONS: No  PAIN:  Are you having pain? Yes: NPRS scale: 6/10 thumb/ hand, shoulders 7/10 Pain location: bilateral shoulders, left wrist Pain description: aching, sharp Aggravating factors: movement, Relieving factors: rest  FALLS: Has patient fallen in last 6 months? No  LIVING ENVIRONMENT: Lives with: has caregiver 8-2, 4:30-8pm Lives in: House/apartment Stairs: No, has ramp Has following equipment at home: Walker - 2 wheeled, shower chair, and Ramped entry  PLOF: Needs assistance with homemaking, Needs assistance with transfers, ADLS, plays bridge, she has  an aide at home that helps with meals, going to MD's, dressing and bathing, is alone at night    PATIENT GOALS: improve functional use of LUE  OBJECTIVE:  Note: Objective measures were completed at Evaluation unless otherwise noted.  HAND DOMINANCE: Right  ADLs: Overall ADLs: requires assist Transfers/ambulation related to ADLs: Eating: needs assit with cutting food Grooming: set up UB Dressing: mod A/ max  LB Dressing: max  Toileting: has grab bar, min A Bathing: min-mod A Tub Shower transfers: min-mod A to rolling shower chair, then caregivers roll you    IADLs:caregiver's perform IADLS/ home management  Medication management: pt and caregiver complete together  Financial management: nephew handles  MOBILITY STATUS: Needs Assist: Pt requires min-mod A for transfers, pt does not walk with caregiver's  POSTURE COMMENTS:  rounded shoulders and forward head       UPPER  EXTREMITY ROM:  RUE elbow wrist and hand grossly WFL, shoulder flexion limited to 50* due to arthritis  Active ROM Right eval Left eval  Shoulder flexion  45  Shoulder abduction    Shoulder adduction    Shoulder extension    Shoulder internal rotation    Shoulder external rotation    Elbow flexion  WFL  Elbow extension  WFL  Wrist flexion  40  Wrist extension  30  Wrist ulnar deviation    Wrist radial deviation    Wrist pronation  WFL  Wrist supination  80%  (Blank rows = not tested)   HAND FUNCTION: Grip strength: Right: 30 lbs; Left: 10 lbs  COORDINATION: Box/ blocks LUE 13 blocks in 1 min   SENSATION: Light touch: Impaired     MUSCLE TONE: LUE: Mild and Hypertonic  COGNITION: Overall cognitive status: Within functional limits for tasks assessed    VISION ASSESSMENT:NT, wear glasses for distance vision  TREATMENT DATE:02/13/24-Paraffin to L hand and wrist x 8 mins for stiffness, along with hotpack to neck and shoulders, no adverse reactions, Pt's CMC thumb splint is fitting well. Splint was applied and pt demonstrates improved pinch during functional activity. removing and re-placing wooden dowel pegs into pegboard, for fine motor coordination, min difficulty with splint, min facilitation, v.c- removing from pegboard with min difficulty Flipping playing cards on low surface, min difficulty vc. Picking up marbles withLUE wearing thumb brace, min difficulty/ v.c to place in container.   6/17/25Paraffin to L hand and wrist x 8 mins for stiffness, along with hotpack to neck and shoulders, no adverse reactions, Pt forgot her splint today but she reports it is fitting well. placing large pegs into foam pegboard, for fine motor coordination, mod difficulty without splint, min facilitation, v.c- removing from pegboard with min  difficulty Velcro roller for key grip, x 4 reps, min difficulty/ v.c Flipping playing cards on low surface, min difficulty vc. Therpist checked box and blocks today see below. Improved however pt has not fully met goal. Picking up checkers, min difficulty v.c to place in container Velcro roller for key grip, x 4 reps, min difficulty/ v.c   01/31/24-Paraffin to L hand and wrist x 8 mins for stiffness, along with hotpack to neck and shoulders, no adverse reactions, Finger thumb abduction, finger thumb opposition with improved performance. Pt's neoprene thumb splint is fitting well and was reapplied. fine motor coordiantion activities to focus on control and functional use of LUE: placing large pegs into foam pegboard, min facilitation, v.c- removeing from dycem, cues for 3 pt pinch, tip pinch Flipping playing cards on low surface, min difficulty vc. Picking up blocks as well as checks, min difficulty v.c to place in container Velcro roller for key grip, x 4 reps, min difficulty/ v.c    PATIENT EDUCATION: Education details:  see above Person educated: Patient Education method: Programmer, multimedia, Demonstration, Verbal cues,  Education comprehension: verbalized understanding, returned demonstration, verbal cues required, and tactile cues required   HOME EXERCISE PROGRAM: 01/07/24 A/ROM, light functional use of LUE, cards, checkers yellow putty  GOALS: Goals reviewed with patient? Yes  SHORT TERM GOALS: Target date: 02/03/24  I with inital HEP.  Goal status: met, pt is performing at home. 01/22/24  2.  Pt will increase A/ROM wrist extension to 40* for improved functional use. Baseline: 30* Goal status: met 50* 01/17/24  3.  Pt will increase LUE box/ blocks score to 16 blocks Baseline: 13 blocks Goal status: ongoing 9 blocks 01/24/24, 14 blocks improved but not met 02/12/24  4.  Pt will increase A/ROM wrist flexion to 50* for improved functional use.  Goal status met 50* 01/17/24  5.  Pt  will increase LUE grip strength to 15 lbs for increased functional use. Baseline: RUE 30 lbs, LUE 10 lbs. Goal status: met 17 lbs 01/24/24  6.  Pt will verbalize understanding of positioning to minimize UE pain. Goal status: ongoing discussed pillow under LUE, avoiding reaching with UE's, thumb CMC splint fitted on 02/12/23  LONG TERM GOALS: Target date: 03/27/24  I with updated HEP  Goal status: ongoing, 02/11/24  2.  Pt will increase LUE box/ blocks score to 16 blocks  Goal status: INITIAL  3.  Pt will use LUE as a gross assist at least 15% of the time. Baseline: uses 5% Goal status: INITIAL  4.  Pt will report decreased drops with LUE. Baseline: frequent drops Goal status: INITIAL 5. Pt will verbalize understanding  of AE/DME to maximize safety/ I including possible walker splint prn.  Goal status: inital 6. Pt will increase LUE grip strength to 20 lbs for increased functional use. (new 01/24/24 since short term goal met.) Baseline: RUE 30 lbs, LUE 10 lbs. Goal status: updated/ revised goal ASSESSMENT:  CLINICAL IMPRESSION:Pt is progressing towards goals. she demonstrates improving LUE contol and improved pinch while wearing thumb CMC brace   PERFORMANCE DEFICITS: in functional skills including ADLs, IADLs, coordination, dexterity, sensation, tone, ROM, strength, flexibility, Fine motor control, Gross motor control, mobility, balance, endurance, decreased knowledge of precautions, decreased knowledge of use of DME, and UE functional use, cognitive skills and psychosocial skills including coping strategies, environmental adaptation, habits, interpersonal interactions, and routines and behaviors.   IMPAIRMENTS: are limiting patient from ADLs, IADLs, rest and sleep, play, leisure, and social participation.   CO-MORBIDITIES: may have co-morbidities  that affects occupational performance. Patient will benefit from skilled OT to address above impairments and improve overall  function.  MODIFICATION OR ASSISTANCE TO COMPLETE EVALUATION: No modification of tasks or assist necessary to complete an evaluation.  OT OCCUPATIONAL PROFILE AND HISTORY: Detailed assessment: Review of records and additional review of physical, cognitive, psychosocial history related to current functional performance.  CLINICAL DECISION MAKING: LOW - limited treatment options, no task modification necessary  REHAB POTENTIAL: Good  EVALUATION COMPLEXITY: Low    PLAN:  OT FREQUENCY: 2x/week  OT DURATION: 12 weeks  PLANNED INTERVENTIONS: 97168 OT Re-evaluation, 97535 self care/ADL training, 16109 therapeutic exercise, 97530 therapeutic activity, 97112 neuromuscular re-education, 97140 manual therapy, 97035 ultrasound, 97018 paraffin, 60454 moist heat, 97010 cryotherapy, 97034 contrast bath, 97014 electrical stimulation unattended, passive range of motion, balance training, functional mobility training, psychosocial skills training, energy conservation, coping strategies training, patient/family education, and DME and/or AE instructions  RECOMMENDED OTHER SERVICES: n/a  CONSULTED AND AGREED WITH PLAN OF CARE: Patient  PLAN FOR NEXT SESSION:  A/ROM, work towards unmet goals,paraffin   Alysiana Ethridge, OT 02/14/2024, 12:02 PM

## 2024-02-19 ENCOUNTER — Ambulatory Visit: Admitting: Occupational Therapy

## 2024-02-19 DIAGNOSIS — L720 Epidermal cyst: Secondary | ICD-10-CM | POA: Diagnosis not present

## 2024-02-19 DIAGNOSIS — L812 Freckles: Secondary | ICD-10-CM | POA: Diagnosis not present

## 2024-02-19 DIAGNOSIS — L821 Other seborrheic keratosis: Secondary | ICD-10-CM | POA: Diagnosis not present

## 2024-02-19 DIAGNOSIS — Z85828 Personal history of other malignant neoplasm of skin: Secondary | ICD-10-CM | POA: Diagnosis not present

## 2024-02-19 DIAGNOSIS — D1801 Hemangioma of skin and subcutaneous tissue: Secondary | ICD-10-CM | POA: Diagnosis not present

## 2024-02-21 ENCOUNTER — Encounter: Payer: Self-pay | Admitting: Occupational Therapy

## 2024-02-21 ENCOUNTER — Ambulatory Visit: Admitting: Occupational Therapy

## 2024-02-21 DIAGNOSIS — R270 Ataxia, unspecified: Secondary | ICD-10-CM | POA: Diagnosis not present

## 2024-02-21 DIAGNOSIS — R293 Abnormal posture: Secondary | ICD-10-CM | POA: Diagnosis not present

## 2024-02-21 DIAGNOSIS — M6281 Muscle weakness (generalized): Secondary | ICD-10-CM | POA: Diagnosis not present

## 2024-02-21 DIAGNOSIS — I69354 Hemiplegia and hemiparesis following cerebral infarction affecting left non-dominant side: Secondary | ICD-10-CM | POA: Diagnosis not present

## 2024-02-21 DIAGNOSIS — R278 Other lack of coordination: Secondary | ICD-10-CM | POA: Diagnosis not present

## 2024-02-21 NOTE — Therapy (Signed)
 OUTPATIENT OCCUPATIONAL THERAPY NEURO Treatment  Patient Name: Karen Jackson MRN: [id number] DOB:05/01/1941, 83 y.o., female Today's Date: 02/21/2024  PCP: Dr. Shayne REFERRING PROVIDER: Dr. Shayne  END OF SESSION:  OT End of Session - 02/21/24 1028     Visit Number 12    Number of Visits 25    Date for OT Re-Evaluation 03/27/24    Authorization Type MCR and  [employer]    Authorization Time Period 12 weeks    Authorization - Visit Number 12    Progress Note Due on Visit 20    OT Start Time 1019    OT Stop Time 1100    OT Time Calculation (min) 41 min                  Past Medical History:  Diagnosis Date   Acute cystitis without hematuria    Acute diastolic CHF (congestive heart failure) (HCC)    Arthritis    Dyspnea    Dysrhythmia    Fever of unknown origin 03/19/2017   Hyperlipidemia    Hypertension    denies at preop   Multifocal pneumonia    Neuromuscular disorder (HCC)    neuropathy left arm and foot   Osteopenia    Paralysis (HCC)    partial left side from CVA    Persistent atrial fibrillation (HCC)    PONV (postoperative nausea and vomiting)    Pre-diabetes    Stroke [facility]) 2013   hemmorahgic   Past Surgical History:  Procedure Laterality Date   ANKLE SURGERY     APPENDECTOMY     CHOLECYSTECTOMY     HARDWARE REMOVAL Left 03/29/2023   Procedure: HARDWARE REMOVAL;  Surgeon: Kit Rush, MD;  Location: [facility];  Service: Orthopedics;  Laterality: Left;   HERNIA REPAIR     Esophagus   INCISION AND DRAINAGE OF WOUND Left 03/29/2023   Procedure: LEFT ANKLE WOUND IRRIGATION AND DEBRIDEMENT WOUND;  Surgeon: Kit Rush, MD;  Location: [facility];  Service: Orthopedics;  Laterality: Left;   JOINT REPLACEMENT     total- right partial- left   MASTECTOMY PARTIAL / LUMPECTOMY  2012   left   ORIF ANKLE FRACTURE Left 07/20/2018   Procedure: OPEN REDUCTION INTERNAL FIXATION (ORIF) ANKLE FRACTURE;  Surgeon: Kit Rush, MD;   Location: [facility];  Service: Orthopedics;  Laterality: Left;   TOTAL KNEE ARTHROPLASTY Left 01/27/2019   Procedure: TOTAL KNEE ARTHROPLASTY;  Surgeon: Rubie Kemps, MD;  Location: [facility];  Service: Orthopedics;  Laterality: Left;   Patient Active Problem List   Diagnosis Date Noted   Goals of care, counseling/discussion 01/17/2022   Grief 01/17/2022   Secondary hypercoagulable state (HCC) 03/16/2021   Dizziness 03/10/2021   Orthostatic hypotension 10/15/2020   Presbycusis of both ears 03/08/2020   Mixed stress and urge urinary incontinence 12/02/2019   Macrocytosis 12/01/2019   Nutritional anemia 12/01/2019   S/P total knee replacement 01/27/2019   Recurrent left knee instability 07/05/2018   Respiratory failure with hypoxia (HCC) 08/30/2017   Hypoxemia    Heart failure with preserved ejection fraction (HCC), Grade 3 diastolic dysfunction 03/26/2017   PAF (paroxysmal atrial fibrillation) (HCC)    Dyspnea 03/19/2017   Encounter for preventive health examination 02/17/2016   Sensorineural hearing loss (SNHL), bilateral 01/26/2016   Hypomagnesemia 04/24/2014   Hemiparesis affecting left side as late effect of cerebrovascular accident (HCC) 04/24/2014   Nontraumatic cerebral hemorrhage (HCC) 04/30/2012   DM (diabetes mellitus) with complications (HCC) 03/04/2010  OSTEOPENIA 01/21/2009   UNSPECIFIED VITAMIN D  DEFICIENCY 11/19/2007   HYPERCHOLESTEROLEMIA 10/25/2006   GASTROESOPHAGEAL REFLUX, NO ESOPHAGITIS 10/25/2006   DIVERTICULOSIS OF COLON 10/25/2006   Osteoarthritis 10/25/2006   CERVICAL SPINE DISORDER, NOS 10/25/2006    ONSET DATE: 12/03/23- referral date  REFERRING DIAG: I69.354 (ICD-10-CM) - Hemiplegia and hemiparesis following cerebral infarction affecting left non-dominant side   THERAPY DIAG:  Muscle weakness (generalized)  Hemiplegia and hemiparesis following cerebral infarction affecting left non-dominant side (HCC)  Abnormal posture  Other lack of  coordination  Rationale for Evaluation and Treatment: Rehabilitation  SUBJECTIVE:   SUBJECTIVE STATEMENT: Pt  reports her shoulder and hand are  feeling better since it is warmer Pt accompanied by: self  PERTINENT HISTORY: CVA 2013, CHF, HTN, A-fib, TKA, s/p ORIF left ankle hardware removal, s/p injection to bilateral shoulders 01/02/24  PRECAUTIONS: Fall, no therapy to bilateral shoulders per MD  WEIGHT BEARING RESTRICTIONS: No  PAIN:  Are you having pain? Yes: NPRS scale: 6/10 thumb/ hand, shoulders 7/10 Pain location: bilateral shoulders, left wrist Pain description: aching, sharp Aggravating factors: movement, Relieving factors: rest  FALLS: Has patient fallen in last 6 months? No  LIVING ENVIRONMENT: Lives with: has caregiver 8-2, 4:30-8pm Lives in: House/apartment Stairs: No, has ramp Has following equipment at home: Walker - 2 wheeled, shower chair, and Ramped entry  PLOF: Needs assistance with homemaking, Needs assistance with transfers, ADLS, plays bridge, she has  an aide at home that helps with meals, going to MD's, dressing and bathing, is alone at night    PATIENT GOALS: improve functional use of LUE  OBJECTIVE:  Note: Objective measures were completed at Evaluation unless otherwise noted.  HAND DOMINANCE: Right  ADLs: Overall ADLs: requires assist Transfers/ambulation related to ADLs: Eating: needs assit with cutting food Grooming: set up UB Dressing: mod A/ max  LB Dressing: max  Toileting: has grab bar, min A Bathing: min-mod A Tub Shower transfers: min-mod A to rolling shower chair, then caregivers roll you    IADLs:caregiver's perform IADLS/ home management  Medication management: pt and caregiver complete together  Financial management: nephew handles  MOBILITY STATUS: Needs Assist: Pt requires min-mod A for transfers, pt does not walk with caregiver's  POSTURE COMMENTS:  rounded shoulders and forward head       UPPER EXTREMITY ROM:   RUE elbow wrist and hand grossly WFL, shoulder flexion limited to 50* due to arthritis  Active ROM Right eval Left eval  Shoulder flexion  45  Shoulder abduction    Shoulder adduction    Shoulder extension    Shoulder internal rotation    Shoulder external rotation    Elbow flexion  WFL  Elbow extension  WFL  Wrist flexion  40  Wrist extension  30  Wrist ulnar deviation    Wrist radial deviation    Wrist pronation  WFL  Wrist supination  80%  (Blank rows = not tested)   HAND FUNCTION: Grip strength: Right: 30 lbs; Left: 10 lbs  COORDINATION: Box/ blocks LUE 13 blocks in 1 min   SENSATION: Light touch: Impaired     MUSCLE TONE: LUE: Mild and Hypertonic  COGNITION: Overall cognitive status: Within functional limits for tasks assessed    VISION ASSESSMENT:NT, wear glasses for distance vision  TREATMENT DATE:02/21/24-Paraffin to L hand and wrist x 8 mins for stiffness,no adverse reactions Pt's CMC splint was applied then pt placed and removed wooden dowel pegs, Gentle joint distraction/mobilization to digits min facilitation/ v.c,  improved control today with minimal drops Flipping playing cards mod difficulty/ drops due to fatigue. Picking up checkers to place in container With LUE, min difficulty, min v.c for full finger extension prior to grasp. Pt with improved perfromance. 02/13/24-Paraffin to L hand and wrist x 8 mins for stiffness, along with hotpack to neck and shoulders, no adverse reactions, Pt's CMC thumb splint is fitting well. Splint was applied and pt demonstrates improved pinch during functional activity. removing and re-placing wooden dowel pegs into pegboard, for fine motor coordination, min difficulty with splint, min facilitation, v.c- removing from pegboard with min difficulty Flipping playing cards on low surface, min  difficulty vc. Picking up marbles withLUE wearing thumb brace, min difficulty/ v.c to place in container.   6/17/25Paraffin to L hand and wrist x 8 mins for stiffness, along with hotpack to neck and shoulders, no adverse reactions, Pt forgot her splint today but she reports it is fitting well. placing large pegs into foam pegboard, for fine motor coordination, mod difficulty without splint, min facilitation, v.c- removing from pegboard with min difficulty Velcro roller for key grip, x 4 reps, min difficulty/ v.c Flipping playing cards on low surface, min difficulty vc. Therpist checked box and blocks today see below. Improved however pt has not fully met goal. Picking up checkers, min difficulty v.c to place in container Velcro roller for key grip, x 4 reps, min difficulty/ v.c   01/31/24-Paraffin to L hand and wrist x 8 mins for stiffness, along with hotpack to neck and shoulders, no adverse reactions, Finger thumb abduction, finger thumb opposition with improved performance. Pt's neoprene thumb splint is fitting well and was reapplied. fine motor coordiantion activities to focus on control and functional use of LUE: placing large pegs into foam pegboard, min facilitation, v.c- removeing from dycem, cues for 3 pt pinch, tip pinch Flipping playing cards on low surface, min difficulty vc. Picking up blocks as well as checks, min difficulty v.c to place in container Velcro roller for key grip, x 4 reps, min difficulty/ v.c    PATIENT EDUCATION: Education details:  see above Person educated: Patient Education method: Programmer, multimedia, Demonstration, Verbal cues,  Education comprehension: verbalized understanding, returned demonstration, verbal cues required, and tactile cues required   HOME EXERCISE PROGRAM: 01/07/24 A/ROM, light functional use of LUE, cards, checkers yellow putty  GOALS: Goals reviewed with patient? Yes  SHORT TERM GOALS: Target date: 02/03/24  I with inital HEP.  Goal  status: met, pt is performing at home. 01/22/24  2.  Pt will increase A/ROM wrist extension to 40* for improved functional use. Baseline: 30* Goal status: met 50* 01/17/24  3.  Pt will increase LUE box/ blocks score to 16 blocks Baseline: 13 blocks Goal status: ongoing 9 blocks 01/24/24, 14 blocks improved but not met 02/12/24  4.  Pt will increase A/ROM wrist flexion to 50* for improved functional use.  Goal status met 50* 01/17/24  5.  Pt will increase LUE grip strength to 15 lbs for increased functional use. Baseline: RUE 30 lbs, LUE 10 lbs. Goal status: met 17 lbs 01/24/24  6.  Pt will verbalize understanding of positioning to minimize UE pain. Goal status: ongoing discussed pillow under LUE, avoiding reaching with UE's, thumb CMC splint fitted on 02/12/23  LONG TERM GOALS:  Target date: 03/27/24  I with updated HEP  Goal status: ongoing, 02/11/24  2.  Pt will increase LUE box/ blocks score to 16 blocks  Goal status: INITIAL  3.  Pt will use LUE as a gross assist at least 15% of the time. Baseline: uses 5% Goal status: INITIAL  4.  Pt will report decreased drops with LUE. Baseline: frequent drops Goal status: INITIAL 5. Pt will verbalize understanding of AE/DME to maximize safety/ I including possible walker splint prn.  Goal status: inital 6. Pt will increase LUE grip strength to 20 lbs for increased functional use. (new 01/24/24 since short term goal met.) Baseline: RUE 30 lbs, LUE 10 lbs. Goal status: updated/ revised goal ASSESSMENT:  CLINICAL IMPRESSION:Pt is progressing towards goals. Pt with improved control and decreased drops placing  and removing pegs while wearing thumb CMC brace   PERFORMANCE DEFICITS: in functional skills including ADLs, IADLs, coordination, dexterity, sensation, tone, ROM, strength, flexibility, Fine motor control, Gross motor control, mobility, balance, endurance, decreased knowledge of precautions, decreased knowledge of use of DME, and UE  functional use, cognitive skills and psychosocial skills including coping strategies, environmental adaptation, habits, interpersonal interactions, and routines and behaviors.   IMPAIRMENTS: are limiting patient from ADLs, IADLs, rest and sleep, play, leisure, and social participation.   CO-MORBIDITIES: may have co-morbidities  that affects occupational performance. Patient will benefit from skilled OT to address above impairments and improve overall function.  MODIFICATION OR ASSISTANCE TO COMPLETE EVALUATION: No modification of tasks or assist necessary to complete an evaluation.  OT OCCUPATIONAL PROFILE AND HISTORY: Detailed assessment: Review of records and additional review of physical, cognitive, psychosocial history related to current functional performance.  CLINICAL DECISION MAKING: LOW - limited treatment options, no task modification necessary  REHAB POTENTIAL: Good  EVALUATION COMPLEXITY: Low    PLAN:  OT FREQUENCY: 2x/week  OT DURATION: 12 weeks  PLANNED INTERVENTIONS: 97168 OT Re-evaluation, 97535 self care/ADL training, 02889 therapeutic exercise, 97530 therapeutic activity, 97112 neuromuscular re-education, 97140 manual therapy, 97035 ultrasound, 97018 paraffin, 02989 moist heat, 97010 cryotherapy, 97034 contrast bath, 97014 electrical stimulation unattended, passive range of motion, balance training, functional mobility training, psychosocial skills training, energy conservation, coping strategies training, patient/family education, and DME and/or AE instructions  RECOMMENDED OTHER SERVICES: n/a  CONSULTED AND AGREED WITH PLAN OF CARE: Patient  PLAN FOR NEXT SESSION:   functional use of LUE, consider walker splint,paraffin   Kenyana Husak, OT 02/21/2024, 10:29 AM

## 2024-02-26 ENCOUNTER — Ambulatory Visit: Attending: Internal Medicine | Admitting: Occupational Therapy

## 2024-02-26 ENCOUNTER — Encounter: Payer: Self-pay | Admitting: Occupational Therapy

## 2024-02-26 DIAGNOSIS — M6281 Muscle weakness (generalized): Secondary | ICD-10-CM | POA: Insufficient documentation

## 2024-02-26 DIAGNOSIS — R270 Ataxia, unspecified: Secondary | ICD-10-CM | POA: Diagnosis not present

## 2024-02-26 DIAGNOSIS — R2681 Unsteadiness on feet: Secondary | ICD-10-CM | POA: Diagnosis not present

## 2024-02-26 DIAGNOSIS — R293 Abnormal posture: Secondary | ICD-10-CM | POA: Diagnosis not present

## 2024-02-26 DIAGNOSIS — I69354 Hemiplegia and hemiparesis following cerebral infarction affecting left non-dominant side: Secondary | ICD-10-CM | POA: Diagnosis not present

## 2024-02-26 DIAGNOSIS — R278 Other lack of coordination: Secondary | ICD-10-CM | POA: Diagnosis not present

## 2024-02-26 NOTE — Therapy (Signed)
 OUTPATIENT OCCUPATIONAL THERAPY NEURO Treatment  Patient Name: Karen Jackson MRN: [id number] DOB:1941-03-09, 83 y.o., female Today's Date: 02/26/2024  PCP: Dr. Shayne REFERRING PROVIDER: Dr. Shayne  END OF SESSION:  OT End of Session - 02/26/24 1220     Visit Number 13    Number of Visits 25    Date for OT Re-Evaluation 03/27/24    Authorization Type MCR and  [employer]    Authorization Time Period 12 weeks    Authorization - Visit Number 13    Progress Note Due on Visit 20    OT Start Time 1105    OT Stop Time 1145    OT Time Calculation (min) 40 min    Activity Tolerance Patient tolerated treatment well    Behavior During Therapy WFL for tasks assessed/performed                   Past Medical History:  Diagnosis Date   Acute cystitis without hematuria    Acute diastolic CHF (congestive heart failure) (HCC)    Arthritis    Dyspnea    Dysrhythmia    Fever of unknown origin 03/19/2017   Hyperlipidemia    Hypertension    denies at preop   Multifocal pneumonia    Neuromuscular disorder (HCC)    neuropathy left arm and foot   Osteopenia    Paralysis (HCC)    partial left side from CVA    Persistent atrial fibrillation (HCC)    PONV (postoperative nausea and vomiting)    Pre-diabetes    Stroke [facility]) 2013   hemmorahgic   Past Surgical History:  Procedure Laterality Date   ANKLE SURGERY     APPENDECTOMY     CHOLECYSTECTOMY     HARDWARE REMOVAL Left 03/29/2023   Procedure: HARDWARE REMOVAL;  Surgeon: Kit Rush, MD;  Location: [facility];  Service: Orthopedics;  Laterality: Left;   HERNIA REPAIR     Esophagus   INCISION AND DRAINAGE OF WOUND Left 03/29/2023   Procedure: LEFT ANKLE WOUND IRRIGATION AND DEBRIDEMENT WOUND;  Surgeon: Kit Rush, MD;  Location: [facility];  Service: Orthopedics;  Laterality: Left;   JOINT REPLACEMENT     total- right partial- left   MASTECTOMY PARTIAL / LUMPECTOMY  2012   left   ORIF ANKLE FRACTURE  Left 07/20/2018   Procedure: OPEN REDUCTION INTERNAL FIXATION (ORIF) ANKLE FRACTURE;  Surgeon: Kit Rush, MD;  Location: [facility];  Service: Orthopedics;  Laterality: Left;   TOTAL KNEE ARTHROPLASTY Left 01/27/2019   Procedure: TOTAL KNEE ARTHROPLASTY;  Surgeon: Rubie Kemps, MD;  Location: [facility];  Service: Orthopedics;  Laterality: Left;   Patient Active Problem List   Diagnosis Date Noted   Goals of care, counseling/discussion 01/17/2022   Grief 01/17/2022   Secondary hypercoagulable state (HCC) 03/16/2021   Dizziness 03/10/2021   Orthostatic hypotension 10/15/2020   Presbycusis of both ears 03/08/2020   Mixed stress and urge urinary incontinence 12/02/2019   Macrocytosis 12/01/2019   Nutritional anemia 12/01/2019   S/P total knee replacement 01/27/2019   Recurrent left knee instability 07/05/2018   Respiratory failure with hypoxia (HCC) 08/30/2017   Hypoxemia    Heart failure with preserved ejection fraction (HCC), Grade 3 diastolic dysfunction 03/26/2017   PAF (paroxysmal atrial fibrillation) (HCC)    Dyspnea 03/19/2017   Encounter for preventive health examination 02/17/2016   Sensorineural hearing loss (SNHL), bilateral 01/26/2016   Hypomagnesemia 04/24/2014   Hemiparesis affecting left side as late effect of  cerebrovascular accident (HCC) 04/24/2014   Nontraumatic cerebral hemorrhage (HCC) 04/30/2012   DM (diabetes mellitus) with complications (HCC) 03/04/2010   OSTEOPENIA 01/21/2009   UNSPECIFIED VITAMIN D  DEFICIENCY 11/19/2007   HYPERCHOLESTEROLEMIA 10/25/2006   GASTROESOPHAGEAL REFLUX, NO ESOPHAGITIS 10/25/2006   DIVERTICULOSIS OF COLON 10/25/2006   Osteoarthritis 10/25/2006   CERVICAL SPINE DISORDER, NOS 10/25/2006    ONSET DATE: 12/03/23- referral date  REFERRING DIAG: I69.354 (ICD-10-CM) - Hemiplegia and hemiparesis following cerebral infarction affecting left non-dominant side   THERAPY DIAG:  Muscle weakness (generalized)  Hemiplegia and hemiparesis following  cerebral infarction affecting left non-dominant side (HCC)  Abnormal posture  Other lack of coordination  Rationale for Evaluation and Treatment: Rehabilitation  SUBJECTIVE:   SUBJECTIVE STATEMENT: Pt  reports her shoulders are bothering her Pt accompanied by: self  PERTINENT HISTORY: CVA 2013, CHF, HTN, A-fib, TKA, s/p ORIF left ankle hardware removal, s/p injection to bilateral shoulders 01/02/24  PRECAUTIONS: Fall, no therapy to bilateral shoulders per MD  WEIGHT BEARING RESTRICTIONS: No  PAIN:  Are you having pain? Yes: NPRS scale: 4/10 thumb/ hand, shoulders 7/10 Pain location: bilateral shoulders, left wrist Pain description: aching, sharp Aggravating factors: movement, Relieving factors: rest  FALLS: Has patient fallen in last 6 months? No  LIVING ENVIRONMENT: Lives with: has caregiver 8-2, 4:30-8pm Lives in: House/apartment Stairs: No, has ramp Has following equipment at home: Walker - 2 wheeled, shower chair, and Ramped entry  PLOF: Needs assistance with homemaking, Needs assistance with transfers, ADLS, plays bridge, she has  an aide at home that helps with meals, going to MD's, dressing and bathing, is alone at night    PATIENT GOALS: improve functional use of LUE  OBJECTIVE:  Note: Objective measures were completed at Evaluation unless otherwise noted.  HAND DOMINANCE: Right  ADLs: Overall ADLs: requires assist Transfers/ambulation related to ADLs: Eating: needs assit with cutting food Grooming: set up UB Dressing: mod A/ max  LB Dressing: max  Toileting: has grab bar, min A Bathing: min-mod A Tub Shower transfers: min-mod A to rolling shower chair, then caregivers roll you    IADLs:caregiver's perform IADLS/ home management  Medication management: pt and caregiver complete together  Financial management: nephew handles  MOBILITY STATUS: Needs Assist: Pt requires min-mod A for transfers, pt does not walk with caregiver's  POSTURE COMMENTS:   rounded shoulders and forward head       UPPER EXTREMITY ROM:  RUE elbow wrist and hand grossly WFL, shoulder flexion limited to 50* due to arthritis  Active ROM Right eval Left eval  Shoulder flexion  45  Shoulder abduction    Shoulder adduction    Shoulder extension    Shoulder internal rotation    Shoulder external rotation    Elbow flexion  WFL  Elbow extension  WFL  Wrist flexion  40  Wrist extension  30  Wrist ulnar deviation    Wrist radial deviation    Wrist pronation  WFL  Wrist supination  80%  (Blank rows = not tested)   HAND FUNCTION: Grip strength: Right: 30 lbs; Left: 10 lbs  COORDINATION: Box/ blocks LUE 13 blocks in 1 min   SENSATION: Light touch: Impaired     MUSCLE TONE: LUE: Mild and Hypertonic  COGNITION: Overall cognitive status: Within functional limits for tasks assessed    VISION ASSESSMENT:NT, wear glasses for distance vision  TREATMENT DATE:02/26/24-discussed risks and benefits of a walker splint with PT and patient. Pt. performed standing with walker x 3 reps, for short duration without a walker splint for increased weightbearing. Initally 2 person assist due pt apprehension, then pt was able to perform sit to stand with min A pulling up on walker with 1 person. Paraffin to L hand and wrist x 8 mins for stiffness,no adverse reactions Pt's CMC splint was applied then pt placed and removed wooden dowel pegs, min drops, v.c and increased  time Putty exercises for sustained grip and pinch min-mod difficulty/ v.c  02/21/24-Paraffin to L hand and wrist x 8 mins for stiffness,no adverse reactions Pt's CMC splint was applied then pt placed and removed wooden dowel pegs, Gentle joint distraction/mobilization to digits min facilitation/ v.c,  improved control today with minimal drops Flipping playing cards mod  difficulty/ drops due to fatigue. Picking up checkers to place in container With LUE, min difficulty, min v.c for full finger extension prior to grasp. Pt with improved perfromance. 02/13/24-Paraffin to L hand and wrist x 8 mins for stiffness, along with hotpack to neck and shoulders, no adverse reactions, Pt's CMC thumb splint is fitting well. Splint was applied and pt demonstrates improved pinch during functional activity. removing and re-placing wooden dowel pegs into pegboard, for fine motor coordination, min difficulty with splint, min facilitation, v.c- removing from pegboard with min difficulty Flipping playing cards on low surface, min difficulty vc. Picking up marbles withLUE wearing thumb brace, min difficulty/ v.c to place in container.   6/17/25Paraffin to L hand and wrist x 8 mins for stiffness, along with hotpack to neck and shoulders, no adverse reactions, Pt forgot her splint today but she reports it is fitting well. placing large pegs into foam pegboard, for fine motor coordination, mod difficulty without splint, min facilitation, v.c- removing from pegboard with min difficulty Velcro roller for key grip, x 4 reps, min difficulty/ v.c Flipping playing cards on low surface, min difficulty vc. Therpist checked box and blocks today see below. Improved however pt has not fully met goal. Picking up checkers, min difficulty v.c to place in container Velcro roller for key grip, x 4 reps, min difficulty/ v.c   01/31/24-Paraffin to L hand and wrist x 8 mins for stiffness, along with hotpack to neck and shoulders, no adverse reactions, Finger thumb abduction, finger thumb opposition with improved performance. Pt's neoprene thumb splint is fitting well and was reapplied. fine motor coordiantion activities to focus on control and functional use of LUE: placing large pegs into foam pegboard, min facilitation, v.c- removeing from dycem, cues for 3 pt pinch, tip pinch Flipping playing cards on  low surface, min difficulty vc. Picking up blocks as well as checks, min difficulty v.c to place in container Velcro roller for key grip, x 4 reps, min difficulty/ v.c    PATIENT EDUCATION: Education details:  see above Person educated: Patient Education method: Programmer, multimedia, Demonstration, Verbal cues,  Education comprehension: verbalized understanding, returned demonstration, verbal cues required, and tactile cues required   HOME EXERCISE PROGRAM: 01/07/24 A/ROM, light functional use of LUE, cards, checkers yellow putty  GOALS: Goals reviewed with patient? Yes  SHORT TERM GOALS: Target date: 02/03/24  I with inital HEP.  Goal status: met, pt is performing at home. 01/22/24  2.  Pt will increase A/ROM wrist extension to 40* for improved functional use. Baseline: 30* Goal status: met 50* 01/17/24  3.  Pt will increase LUE box/ blocks score to 16 blocks  Baseline: 13 blocks Goal status: ongoing 9 blocks 01/24/24, 14 blocks improved but not met 02/12/24  4.  Pt will increase A/ROM wrist flexion to 50* for improved functional use.  Goal status met 50* 01/17/24  5.  Pt will increase LUE grip strength to 15 lbs for increased functional use. Baseline: RUE 30 lbs, LUE 10 lbs. Goal status: met 17 lbs 01/24/24  6.  Pt will verbalize understanding of positioning to minimize UE pain. Goal status: ongoing discussed pillow under LUE, avoiding reaching with UE's, thumb CMC splint fitted on 02/12/23  LONG TERM GOALS: Target date: 03/27/24  I with updated HEP  Goal status: ongoing, 02/11/24  2.  Pt will increase LUE box/ blocks score to 16 blocks  Goal status: ongoing 02/26/24  3.  Pt will use LUE as a gross assist at least 15% of the time. Baseline: uses 5% Goal status: ongoing 02/26/24, able to use for weightbearing on walker today  4.  Pt will report decreased drops with LUE. Baseline: frequent drops Goal status: INITIAL 5. Pt will verbalize understanding of AE/DME to maximize safety/ I  including possible walker splint prn.  Goal status: ongoing, pt stood today without walker splint, she does not need walker splint at this time. 02/26/24 6. Pt will increase LUE grip strength to 20 lbs for increased functional use. (new 01/24/24 since short term goal met.) Baseline: RUE 30 lbs, LUE 10 lbs. Goal status: updated/ revised goal, ongoing 02/26/24 ASSESSMENT:  CLINICAL IMPRESSION:Pt is progressing towards goals. Pt was able to stand with her walker today for increased weightbearing. Pt does not need a walker splint. She was able to sustain grip on walker    PERFORMANCE DEFICITS: in functional skills including ADLs, IADLs, coordination, dexterity, sensation, tone, ROM, strength, flexibility, Fine motor control, Gross motor control, mobility, balance, endurance, decreased knowledge of precautions, decreased knowledge of use of DME, and UE functional use, cognitive skills and psychosocial skills including coping strategies, environmental adaptation, habits, interpersonal interactions, and routines and behaviors.   IMPAIRMENTS: are limiting patient from ADLs, IADLs, rest and sleep, play, leisure, and social participation.   CO-MORBIDITIES: may have co-morbidities  that affects occupational performance. Patient will benefit from skilled OT to address above impairments and improve overall function.  MODIFICATION OR ASSISTANCE TO COMPLETE EVALUATION: No modification of tasks or assist necessary to complete an evaluation.  OT OCCUPATIONAL PROFILE AND HISTORY: Detailed assessment: Review of records and additional review of physical, cognitive, psychosocial history related to current functional performance.  CLINICAL DECISION MAKING: LOW - limited treatment options, no task modification necessary  REHAB POTENTIAL: Good  EVALUATION COMPLEXITY: Low    PLAN:  OT FREQUENCY: 2x/week  OT DURATION: 12 weeks  PLANNED INTERVENTIONS: 97168 OT Re-evaluation, 97535 self care/ADL training, 02889  therapeutic exercise, 97530 therapeutic activity, 97112 neuromuscular re-education, 97140 manual therapy, 97035 ultrasound, 97018 paraffin, 02989 moist heat, 97010 cryotherapy, 97034 contrast bath, 97014 electrical stimulation unattended, passive range of motion, balance training, functional mobility training, psychosocial skills training, energy conservation, coping strategies training, patient/family education, and DME and/or AE instructions  RECOMMENDED OTHER SERVICES: n/a  CONSULTED AND AGREED WITH PLAN OF CARE: Patient  PLAN FOR NEXT SESSION:    standing with walker for weightbearing through LUE for increased control   Tyshay Adee, OT 02/26/2024, 12:20 PM

## 2024-03-04 ENCOUNTER — Encounter: Payer: Self-pay | Admitting: Occupational Therapy

## 2024-03-04 ENCOUNTER — Ambulatory Visit: Admitting: Occupational Therapy

## 2024-03-04 DIAGNOSIS — I69354 Hemiplegia and hemiparesis following cerebral infarction affecting left non-dominant side: Secondary | ICD-10-CM

## 2024-03-04 DIAGNOSIS — R293 Abnormal posture: Secondary | ICD-10-CM | POA: Diagnosis not present

## 2024-03-04 DIAGNOSIS — M6281 Muscle weakness (generalized): Secondary | ICD-10-CM

## 2024-03-04 DIAGNOSIS — R278 Other lack of coordination: Secondary | ICD-10-CM

## 2024-03-04 DIAGNOSIS — R270 Ataxia, unspecified: Secondary | ICD-10-CM | POA: Diagnosis not present

## 2024-03-04 DIAGNOSIS — R2681 Unsteadiness on feet: Secondary | ICD-10-CM | POA: Diagnosis not present

## 2024-03-04 NOTE — Therapy (Signed)
 OUTPATIENT OCCUPATIONAL THERAPY NEURO Treatment  Patient Name: Karen Jackson MRN: [id number] DOB:[birth year], 83 y.o., female Today's Date: 03/04/2024  PCP: Dr. Shayne REFERRING PROVIDER: Dr. Shayne  END OF SESSION:  OT End of Session - 03/04/24 1346     Visit Number 14    Number of Visits 25    Date for OT Re-Evaluation 03/27/24    Authorization Type MCR and  [employer]    Authorization Time Period 12 weeks    Authorization - Visit Number 14    Progress Note Due on Visit 20    OT Start Time 1021    OT Stop Time 1100    OT Time Calculation (min) 39 min    Activity Tolerance Patient tolerated treatment well    Behavior During Therapy WFL for tasks assessed/performed                    Past Medical History:  Diagnosis Date   Acute cystitis without hematuria    Acute diastolic CHF (congestive heart failure) (HCC)    Arthritis    Dyspnea    Dysrhythmia    Fever of unknown origin 03/19/2017   Hyperlipidemia    Hypertension    denies at preop   Multifocal pneumonia    Neuromuscular disorder (HCC)    neuropathy left arm and foot   Osteopenia    Paralysis (HCC)    partial left side from CVA    Persistent atrial fibrillation (HCC)    PONV (postoperative nausea and vomiting)    Pre-diabetes    Stroke [facility]) 2013   hemmorahgic   Past Surgical History:  Procedure Laterality Date   ANKLE SURGERY     APPENDECTOMY     CHOLECYSTECTOMY     HARDWARE REMOVAL Left 03/29/2023   Procedure: HARDWARE REMOVAL;  Surgeon: Kit Rush, MD;  Location: [facility];  Service: Orthopedics;  Laterality: Left;   HERNIA REPAIR     Esophagus   INCISION AND DRAINAGE OF WOUND Left 03/29/2023   Procedure: LEFT ANKLE WOUND IRRIGATION AND DEBRIDEMENT WOUND;  Surgeon: Kit Rush, MD;  Location: [facility];  Service: Orthopedics;  Laterality: Left;   JOINT REPLACEMENT     total- right partial- left   MASTECTOMY PARTIAL / LUMPECTOMY  2012   left   ORIF ANKLE  FRACTURE Left 07/20/2018   Procedure: OPEN REDUCTION INTERNAL FIXATION (ORIF) ANKLE FRACTURE;  Surgeon: Kit Rush, MD;  Location: [facility];  Service: Orthopedics;  Laterality: Left;   TOTAL KNEE ARTHROPLASTY Left 01/27/2019   Procedure: TOTAL KNEE ARTHROPLASTY;  Surgeon: Rubie Kemps, MD;  Location: [facility];  Service: Orthopedics;  Laterality: Left;   Patient Active Problem List   Diagnosis Date Noted   Goals of care, counseling/discussion 01/17/2022   Grief 01/17/2022   Secondary hypercoagulable state (HCC) 03/16/2021   Dizziness 03/10/2021   Orthostatic hypotension 10/15/2020   Presbycusis of both ears 03/08/2020   Mixed stress and urge urinary incontinence 12/02/2019   Macrocytosis 12/01/2019   Nutritional anemia 12/01/2019   S/P total knee replacement 01/27/2019   Recurrent left knee instability 07/05/2018   Respiratory failure with hypoxia (HCC) 08/30/2017   Hypoxemia    Heart failure with preserved ejection fraction (HCC), Grade 3 diastolic dysfunction 03/26/2017   PAF (paroxysmal atrial fibrillation) (HCC)    Dyspnea 03/19/2017   Encounter for preventive health examination 02/17/2016   Sensorineural hearing loss (SNHL), bilateral 01/26/2016   Hypomagnesemia 04/24/2014   Hemiparesis affecting left side as late effect  of cerebrovascular accident (HCC) 04/24/2014   Nontraumatic cerebral hemorrhage (HCC) 04/30/2012   DM (diabetes mellitus) with complications (HCC) 03/04/2010   OSTEOPENIA 01/21/2009   UNSPECIFIED VITAMIN D  DEFICIENCY 11/19/2007   HYPERCHOLESTEROLEMIA 10/25/2006   GASTROESOPHAGEAL REFLUX, NO ESOPHAGITIS 10/25/2006   DIVERTICULOSIS OF COLON 10/25/2006   Osteoarthritis 10/25/2006   CERVICAL SPINE DISORDER, NOS 10/25/2006    ONSET DATE: 12/03/23- referral date  REFERRING DIAG: I69.354 (ICD-10-CM) - Hemiplegia and hemiparesis following cerebral infarction affecting left non-dominant side   THERAPY DIAG:  Muscle weakness (generalized)  Hemiplegia and hemiparesis  following cerebral infarction affecting left non-dominant side (HCC)  Abnormal posture  Other lack of coordination  Rationale for Evaluation and Treatment: Rehabilitation  SUBJECTIVE:   SUBJECTIVE STATEMENT: Pt  reports her shoulder hand and ankle are bothering her Pt accompanied by: self  PERTINENT HISTORY: CVA 2013, CHF, HTN, A-fib, TKA, s/p ORIF left ankle hardware removal, s/p injection to bilateral shoulders 01/02/24  PRECAUTIONS: Fall, no therapy to bilateral shoulders per MD  WEIGHT BEARING RESTRICTIONS: No  PAIN:  Are you having pain? Yes: NPRS scale: 7/10 thumb/ hand, shoulders 7/10, ankle 7/10 Pain location: bilateral shoulders, left wrist Pain description: aching, sharp Aggravating factors: movement, Relieving factors: rest  FALLS: Has patient fallen in last 6 months? No  LIVING ENVIRONMENT: Lives with: has caregiver 8-2, 4:30-8pm Lives in: House/apartment Stairs: No, has ramp Has following equipment at home: Walker - 2 wheeled, shower chair, and Ramped entry  PLOF: Needs assistance with homemaking, Needs assistance with transfers, ADLS, plays bridge, she has  an aide at home that helps with meals, going to MD's, dressing and bathing, is alone at night    PATIENT GOALS: improve functional use of LUE  OBJECTIVE:  Note: Objective measures were completed at Evaluation unless otherwise noted.  HAND DOMINANCE: Right  ADLs: Overall ADLs: requires assist Transfers/ambulation related to ADLs: Eating: needs assit with cutting food Grooming: set up UB Dressing: mod A/ max  LB Dressing: max  Toileting: has grab bar, min A Bathing: min-mod A Tub Shower transfers: min-mod A to rolling shower chair, then caregivers roll you    IADLs:caregiver's perform IADLS/ home management  Medication management: pt and caregiver complete together  Financial management: nephew handles  MOBILITY STATUS: Needs Assist: Pt requires min-mod A for transfers, pt does not walk with  caregiver's  POSTURE COMMENTS:  rounded shoulders and forward head       UPPER EXTREMITY ROM:  RUE elbow wrist and hand grossly WFL, shoulder flexion limited to 50* due to arthritis  Active ROM Right eval Left eval  Shoulder flexion  45  Shoulder abduction    Shoulder adduction    Shoulder extension    Shoulder internal rotation    Shoulder external rotation    Elbow flexion  WFL  Elbow extension  WFL  Wrist flexion  40  Wrist extension  30  Wrist ulnar deviation    Wrist radial deviation    Wrist pronation  WFL  Wrist supination  80%  (Blank rows = not tested)   HAND FUNCTION: Grip strength: Right: 30 lbs; Left: 10 lbs  COORDINATION: Box/ blocks LUE 13 blocks in 1 min   SENSATION: Light touch: Impaired     MUSCLE TONE: LUE: Mild and Hypertonic  COGNITION: Overall cognitive status: Within functional limits for tasks assessed    VISION ASSESSMENT:NT, wear glasses for distance vision  TREATMENT DATE:03/04/24- Pt with reports of increased pain today.Paraffin to L hand and wrist x 8 mins for stiffness, along with hotpack to neck and shoulders, no adverse reactions, Pt reports ankle pain so she declined standing Fine motor coordination activities: flipping playing cards, picking up checkers to place in container min difficulty wearing thumb brace. Removing wooden pegs with only min difficulty wearing brace, replacing with min-mod difficulty/ drops. 02/26/24-discussed risks and benefits of a walker splint with PT and patient. Pt. performed standing with walker x 3 reps, for short duration without a walker splint for increased weightbearing. Initally 2 person assist due pt apprehension, then pt was able to perform sit to stand with min A pulling up on walker with 1 person. Paraffin to L hand and wrist x 8 mins for stiffness,no adverse  reactions Pt's CMC splint was applied then pt placed and removed wooden dowel pegs, min drops, v.c and increased  time Putty exercises for sustained grip and pinch min-mod difficulty/ v.c  02/21/24-Paraffin to L hand and wrist x 8 mins for stiffness,no adverse reactions Pt's CMC splint was applied then pt placed and removed wooden dowel pegs, Gentle joint distraction/mobilization to digits min facilitation/ v.c,  improved control today with minimal drops Flipping playing cards mod difficulty/ drops due to fatigue. Picking up checkers to place in container With LUE, min difficulty, min v.c for full finger extension prior to grasp. Pt with improved perfromance. 02/13/24-Paraffin to L hand and wrist x 8 mins for stiffness, along with hotpack to neck and shoulders, no adverse reactions, Pt's CMC thumb splint is fitting well. Splint was applied and pt demonstrates improved pinch during functional activity. removing and re-placing wooden dowel pegs into pegboard, for fine motor coordination, min difficulty with splint, min facilitation, v.c- removing from pegboard with min difficulty Flipping playing cards on low surface, min difficulty vc. Picking up marbles withLUE wearing thumb brace, min difficulty/ v.c to place in container.   6/17/25Paraffin to L hand and wrist x 8 mins for stiffness, along with hotpack to neck and shoulders, no adverse reactions, Pt forgot her splint today but she reports it is fitting well. placing large pegs into foam pegboard, for fine motor coordination, mod difficulty without splint, min facilitation, v.c- removing from pegboard with min difficulty Velcro roller for key grip, x 4 reps, min difficulty/ v.c Flipping playing cards on low surface, min difficulty vc. Therpist checked box and blocks today see below. Improved however pt has not fully met goal. Picking up checkers, min difficulty v.c to place in container Velcro roller for key grip, x 4 reps, min difficulty/  v.c   01/31/24-Paraffin to L hand and wrist x 8 mins for stiffness, along with hotpack to neck and shoulders, no adverse reactions, Finger thumb abduction, finger thumb opposition with improved performance. Pt's neoprene thumb splint is fitting well and was reapplied. fine motor coordiantion activities to focus on control and functional use of LUE: placing large pegs into foam pegboard, min facilitation, v.c- removeing from dycem, cues for 3 pt pinch, tip pinch Flipping playing cards on low surface, min difficulty vc. Picking up blocks as well as checks, min difficulty v.c to place in container Velcro roller for key grip, x 4 reps, min difficulty/ v.c    PATIENT EDUCATION: Education details:  see above Person educated: Patient Education method: Programmer, multimedia, Demonstration, Verbal cues,  Education comprehension: verbalized understanding, returned demonstration, verbal cues required, and tactile cues required   HOME EXERCISE PROGRAM: 01/07/24 A/ROM, light functional use of  LUE, cards, checkers yellow putty  GOALS: Goals reviewed with patient? Yes  SHORT TERM GOALS: Target date: 02/03/24  I with inital HEP.  Goal status: met, pt is performing at home. 01/22/24  2.  Pt will increase A/ROM wrist extension to 40* for improved functional use. Baseline: 30* Goal status: met 50* 01/17/24  3.  Pt will increase LUE box/ blocks score to 16 blocks Baseline: 13 blocks Goal status: ongoing 9 blocks 01/24/24, 14 blocks improved but not met 02/12/24  4.  Pt will increase A/ROM wrist flexion to 50* for improved functional use.  Goal status met 50* 01/17/24  5.  Pt will increase LUE grip strength to 15 lbs for increased functional use. Baseline: RUE 30 lbs, LUE 10 lbs. Goal status: met 17 lbs 01/24/24  6.  Pt will verbalize understanding of positioning to minimize UE pain. Goal status: ongoing discussed pillow under LUE, avoiding reaching with UE's, thumb CMC splint fitted on 02/12/23  LONG TERM  GOALS: Target date: 03/27/24  I with updated HEP  Goal status: ongoing, 03/04/24  2.  Pt will increase LUE box/ blocks score to 16 blocks  Goal status: ongoing 03/04/24  3.  Pt will use LUE as a gross assist at least 15% of the time. Baseline: uses 5% Goal status: ongoing 03/04/24,5%   4.  Pt will report decreased drops with LUE. Baseline: frequent drops Goal status: INITIAL 5. Pt will verbalize understanding of AE/DME to maximize safety/ I including possible walker splint prn.  Goal status: ongoing, pt stood today without walker splint, she does not need walker splint at this time. 02/26/24 6. Pt will increase LUE grip strength to 20 lbs for increased functional use. (new 01/24/24 since short term goal met.) Baseline: RUE 30 lbs, LUE 10 lbs. Goal status:  met 21 lbs 03/04/24 ASSESSMENT:  CLINICAL IMPRESSION:Pt is progressing towards goals. Pt's grip strength has now improved to 21 lbs for LUE and she demonstrates improved contol.  PERFORMANCE DEFICITS: in functional skills including ADLs, IADLs, coordination, dexterity, sensation, tone, ROM, strength, flexibility, Fine motor control, Gross motor control, mobility, balance, endurance, decreased knowledge of precautions, decreased knowledge of use of DME, and UE functional use, cognitive skills and psychosocial skills including coping strategies, environmental adaptation, habits, interpersonal interactions, and routines and behaviors.   IMPAIRMENTS: are limiting patient from ADLs, IADLs, rest and sleep, play, leisure, and social participation.   CO-MORBIDITIES: may have co-morbidities  that affects occupational performance. Patient will benefit from skilled OT to address above impairments and improve overall function.  MODIFICATION OR ASSISTANCE TO COMPLETE EVALUATION: No modification of tasks or assist necessary to complete an evaluation.  OT OCCUPATIONAL PROFILE AND HISTORY: Detailed assessment: Review of records and additional review of  physical, cognitive, psychosocial history related to current functional performance.  CLINICAL DECISION MAKING: LOW - limited treatment options, no task modification necessary  REHAB POTENTIAL: Good  EVALUATION COMPLEXITY: Low    PLAN:  OT FREQUENCY: 2x/week  OT DURATION: 12 weeks  PLANNED INTERVENTIONS: 97168 OT Re-evaluation, 97535 self care/ADL training, 02889 therapeutic exercise, 97530 therapeutic activity, 97112 neuromuscular re-education, 97140 manual therapy, 97035 ultrasound, 97018 paraffin, 02989 moist heat, 97010 cryotherapy, 97034 contrast bath, 97014 electrical stimulation unattended, passive range of motion, balance training, functional mobility training, psychosocial skills training, energy conservation, coping strategies training, patient/family education, and DME and/or AE instructions  RECOMMENDED OTHER SERVICES: n/a  CONSULTED AND AGREED WITH PLAN OF CARE: Patient  PLAN FOR NEXT SESSION:    coordination, standing with walker  for weightbearing through LUE for increased control   Carleen Rhue, OT 03/04/2024, 1:47 PM

## 2024-03-11 ENCOUNTER — Encounter: Payer: Self-pay | Admitting: Occupational Therapy

## 2024-03-11 ENCOUNTER — Ambulatory Visit: Admitting: Occupational Therapy

## 2024-03-11 DIAGNOSIS — I69354 Hemiplegia and hemiparesis following cerebral infarction affecting left non-dominant side: Secondary | ICD-10-CM

## 2024-03-11 DIAGNOSIS — R2681 Unsteadiness on feet: Secondary | ICD-10-CM | POA: Diagnosis not present

## 2024-03-11 DIAGNOSIS — R270 Ataxia, unspecified: Secondary | ICD-10-CM

## 2024-03-11 DIAGNOSIS — R293 Abnormal posture: Secondary | ICD-10-CM

## 2024-03-11 DIAGNOSIS — M6281 Muscle weakness (generalized): Secondary | ICD-10-CM

## 2024-03-11 DIAGNOSIS — R278 Other lack of coordination: Secondary | ICD-10-CM | POA: Diagnosis not present

## 2024-03-11 NOTE — Therapy (Addendum)
 OUTPATIENT OCCUPATIONAL THERAPY NEURO Treatment  Patient Name: Karen Jackson MRN: [id number] DOB:01-29-1941, 83 y.o., female Today's Date: 03/11/2024  PCP: Dr. Shayne REFERRING PROVIDER: Dr. Shayne  END OF SESSION:  OT End of Session - 03/11/24 1112     Visit Number 15    Number of Visits 25    Date for OT Re-Evaluation 03/27/24    Authorization Type MCR and  [employer]    Authorization Time Period 12 weeks    Authorization - Visit Number 15    Progress Note Due on Visit 20    OT Start Time 1103    OT Stop Time 1143    OT Time Calculation (min) 40 min    Activity Tolerance Patient tolerated treatment well    Behavior During Therapy WFL for tasks assessed/performed                     Past Medical History:  Diagnosis Date   Acute cystitis without hematuria    Acute diastolic CHF (congestive heart failure) (HCC)    Arthritis    Dyspnea    Dysrhythmia    Fever of unknown origin 03/19/2017   Hyperlipidemia    Hypertension    denies at preop   Multifocal pneumonia    Neuromuscular disorder (HCC)    neuropathy left arm and foot   Osteopenia    Paralysis (HCC)    partial left side from CVA    Persistent atrial fibrillation (HCC)    PONV (postoperative nausea and vomiting)    Pre-diabetes    Stroke [facility]) 2013   hemmorahgic   Past Surgical History:  Procedure Laterality Date   ANKLE SURGERY     APPENDECTOMY     CHOLECYSTECTOMY     HARDWARE REMOVAL Left 03/29/2023   Procedure: HARDWARE REMOVAL;  Surgeon: Kit Rush, MD;  Location: [facility];  Service: Orthopedics;  Laterality: Left;   HERNIA REPAIR     Esophagus   INCISION AND DRAINAGE OF WOUND Left 03/29/2023   Procedure: LEFT ANKLE WOUND IRRIGATION AND DEBRIDEMENT WOUND;  Surgeon: Kit Rush, MD;  Location: [facility];  Service: Orthopedics;  Laterality: Left;   JOINT REPLACEMENT     total- right partial- left   MASTECTOMY PARTIAL / LUMPECTOMY  2012   left   ORIF ANKLE  FRACTURE Left 07/20/2018   Procedure: OPEN REDUCTION INTERNAL FIXATION (ORIF) ANKLE FRACTURE;  Surgeon: Kit Rush, MD;  Location: [facility];  Service: Orthopedics;  Laterality: Left;   TOTAL KNEE ARTHROPLASTY Left 01/27/2019   Procedure: TOTAL KNEE ARTHROPLASTY;  Surgeon: Rubie Kemps, MD;  Location: [facility];  Service: Orthopedics;  Laterality: Left;   Patient Active Problem List   Diagnosis Date Noted   Goals of care, counseling/discussion 01/17/2022   Grief 01/17/2022   Secondary hypercoagulable state (HCC) 03/16/2021   Dizziness 03/10/2021   Orthostatic hypotension 10/15/2020   Presbycusis of both ears 03/08/2020   Mixed stress and urge urinary incontinence 12/02/2019   Macrocytosis 12/01/2019   Nutritional anemia 12/01/2019   S/P total knee replacement 01/27/2019   Recurrent left knee instability 07/05/2018   Respiratory failure with hypoxia (HCC) 08/30/2017   Hypoxemia    Heart failure with preserved ejection fraction (HCC), Grade 3 diastolic dysfunction 03/26/2017   PAF (paroxysmal atrial fibrillation) (HCC)    Dyspnea 03/19/2017   Encounter for preventive health examination 02/17/2016   Sensorineural hearing loss (SNHL), bilateral 01/26/2016   Hypomagnesemia 04/24/2014   Hemiparesis affecting left side as late  effect of cerebrovascular accident (HCC) 04/24/2014   Nontraumatic cerebral hemorrhage (HCC) 04/30/2012   DM (diabetes mellitus) with complications (HCC) 03/04/2010   OSTEOPENIA 01/21/2009   UNSPECIFIED VITAMIN D  DEFICIENCY 11/19/2007   HYPERCHOLESTEROLEMIA 10/25/2006   GASTROESOPHAGEAL REFLUX, NO ESOPHAGITIS 10/25/2006   DIVERTICULOSIS OF COLON 10/25/2006   Osteoarthritis 10/25/2006   CERVICAL SPINE DISORDER, NOS 10/25/2006    ONSET DATE: 12/03/23- referral date  REFERRING DIAG: I69.354 (ICD-10-CM) - Hemiplegia and hemiparesis following cerebral infarction affecting left non-dominant side   THERAPY DIAG:  Muscle weakness (generalized)  Hemiplegia and hemiparesis  following cerebral infarction affecting left non-dominant side (HCC)  Abnormal posture  Other lack of coordination  Ataxia  Rationale for Evaluation and Treatment: Rehabilitation  SUBJECTIVE:   SUBJECTIVE STATEMENT: Pt  reports her shoulder worse than hand today Pt accompanied by: self  PERTINENT HISTORY: CVA 2013, CHF, HTN, A-fib, TKA, s/p ORIF left ankle hardware removal, s/p injection to bilateral shoulders 01/02/24  PRECAUTIONS: Fall, no therapy to bilateral shoulders per MD  WEIGHT BEARING RESTRICTIONS: No  PAIN:  Are you having pain? Yes: NPRS scale: 5/10 thumb/ hand, shoulders 7/10,  Pain location: bilateral shoulders, left wrist Pain description: aching, sharp Aggravating factors: movement, Relieving factors: rest  FALLS: Has patient fallen in last 6 months? No  LIVING ENVIRONMENT: Lives with: has caregiver 8-2, 4:30-8pm Lives in: House/apartment Stairs: No, has ramp Has following equipment at home: Walker - 2 wheeled, shower chair, and Ramped entry  PLOF: Needs assistance with homemaking, Needs assistance with transfers, ADLS, plays bridge, she has  an aide at home that helps with meals, going to MD's, dressing and bathing, is alone at night    PATIENT GOALS: improve functional use of LUE  OBJECTIVE:  Note: Objective measures were completed at Evaluation unless otherwise noted.  HAND DOMINANCE: Right  ADLs: Overall ADLs: requires assist Transfers/ambulation related to ADLs: Eating: needs assit with cutting food Grooming: set up UB Dressing: mod A/ max  LB Dressing: max  Toileting: has grab bar, min A Bathing: min-mod A Tub Shower transfers: min-mod A to rolling shower chair, then caregivers roll you    IADLs:caregiver's perform IADLS/ home management  Medication management: pt and caregiver complete together  Financial management: nephew handles  MOBILITY STATUS: Needs Assist: Pt requires min-mod A for transfers, pt does not walk with  caregiver's  POSTURE COMMENTS:  rounded shoulders and forward head       UPPER EXTREMITY ROM:  RUE elbow wrist and hand grossly WFL, shoulder flexion limited to 50* due to arthritis  Active ROM Right eval Left eval  Shoulder flexion  45  Shoulder abduction    Shoulder adduction    Shoulder extension    Shoulder internal rotation    Shoulder external rotation    Elbow flexion  WFL  Elbow extension  WFL  Wrist flexion  40  Wrist extension  30  Wrist ulnar deviation    Wrist radial deviation    Wrist pronation  WFL  Wrist supination  80%  (Blank rows = not tested)   HAND FUNCTION: Grip strength: Right: 30 lbs; Left: 10 lbs  COORDINATION: Box/ blocks LUE 13 blocks in 1 min   SENSATION: Light touch: Impaired     MUSCLE TONE: LUE: Mild and Hypertonic  COGNITION: Overall cognitive status: Within functional limits for tasks assessed    VISION ASSESSMENT:NT, wear glasses for distance vision  TREATMENT DATE:7/15/25Paraffin to L hand and wrist x 9 mins for stiffness,no dverse reactions. Functional reaching on a low surface to grasp wooden dowel pegs to remove from pegboard then replace. Min difficulty removing, mod difficulty/ drops returning pegs to pegboard, min v.c. Sit-stand at sink min A-mod A, min A while standing and perfoming functional reach with left and right UE's, weightbearing through other hand when reaching with one. Pt reported that her leg brace was rubbing her, so therapist took ankle brace off and replaced. Pt has bandage over lateral ankle. After therapist repositioned brace pt reported improved comfort. Picking up checkers to place in container with LUe, min difficulty/ dops, min v.c. Pt forgot her thumb brace today however she was sucessful with her use of LUE.     03/04/24- Pt with reports of increased pain  today.Paraffin to L hand and wrist x 8 mins for stiffness, along with hotpack to neck and shoulders, no adverse reactions, Pt reports ankle pain so she declined standing Fine motor coordination activities: flipping playing cards, picking up checkers to place in container min difficulty wearing thumb brace. Removing wooden pegs with only min difficulty wearing brace, replacing with min-mod difficulty/ drops. 02/26/24-discussed risks and benefits of a walker splint with PT and patient. Pt. performed standing with walker x 3 reps, for short duration without a walker splint for increased weightbearing. Initally 2 person assist due pt apprehension, then pt was able to perform sit to stand with min A pulling up on walker with 1 person. Paraffin to L hand and wrist x 8 mins for stiffness,no adverse reactions Pt's CMC splint was applied then pt placed and removed wooden dowel pegs, min drops, v.c and increased  time Putty exercises for sustained grip and pinch min-mod difficulty/ v.c  02/21/24-Paraffin to L hand and wrist x 8 mins for stiffness,no adverse reactions Pt's CMC splint was applied then pt placed and removed wooden dowel pegs, Gentle joint distraction/mobilization to digits min facilitation/ v.c,  improved control today with minimal drops Flipping playing cards mod difficulty/ drops due to fatigue. Picking up checkers to place in container With LUE, min difficulty, min v.c for full finger extension prior to grasp. Pt with improved perfromance. 02/13/24-Paraffin to L hand and wrist x 8 mins for stiffness, along with hotpack to neck and shoulders, no adverse reactions, Pt's CMC thumb splint is fitting well. Splint was applied and pt demonstrates improved pinch during functional activity. removing and re-placing wooden dowel pegs into pegboard, for fine motor coordination, min difficulty with splint, min facilitation, v.c- removing from pegboard with min difficulty Flipping playing cards on low surface,  min difficulty vc. Picking up marbles withLUE wearing thumb brace, min difficulty/ v.c to place in container.   6/17/25Paraffin to L hand and wrist x 8 mins for stiffness, along with hotpack to neck and shoulders, no adverse reactions, Pt forgot her splint today but she reports it is fitting well. placing large pegs into foam pegboard, for fine motor coordination, mod difficulty without splint, min facilitation, v.c- removing from pegboard with min difficulty Velcro roller for key grip, x 4 reps, min difficulty/ v.c Flipping playing cards on low surface, min difficulty vc. Therpist checked box and blocks today see below. Improved however pt has not fully met goal. Picking up checkers, min difficulty v.c to place in container Velcro roller for key grip, x 4 reps, min difficulty/ v.c   01/31/24-Paraffin to L hand and wrist x 8 mins for stiffness, along with hotpack to neck and  shoulders, no adverse reactions, Finger thumb abduction, finger thumb opposition with improved performance. Pt's neoprene thumb splint is fitting well and was reapplied. fine motor coordiantion activities to focus on control and functional use of LUE: placing large pegs into foam pegboard, min facilitation, v.c- removeing from dycem, cues for 3 pt pinch, tip pinch Flipping playing cards on low surface, min difficulty vc. Picking up blocks as well as checks, min difficulty v.c to place in container Velcro roller for key grip, x 4 reps, min difficulty/ v.c    PATIENT EDUCATION: Education details:  see above Person educated: Patient Education method: Programmer, multimedia, Demonstration, Verbal cues,  Education comprehension: verbalized understanding, returned demonstration, verbal cues required, and tactile cues required   HOME EXERCISE PROGRAM: 01/07/24 A/ROM, light functional use of LUE, cards, checkers yellow putty  GOALS: Goals reviewed with patient? Yes  SHORT TERM GOALS: Target date: 02/03/24  I with inital  HEP.  Goal status: met, pt is performing at home. 01/22/24  2.  Pt will increase A/ROM wrist extension to 40* for improved functional use. Baseline: 30* Goal status: met 50* 01/17/24  3.  Pt will increase LUE box/ blocks score to 16 blocks Baseline: 13 blocks Goal status: ongoing 9 blocks 01/24/24, 14 blocks improved but not met 02/12/24  4.  Pt will increase A/ROM wrist flexion to 50* for improved functional use.  Goal status met 50* 01/17/24  5.  Pt will increase LUE grip strength to 15 lbs for increased functional use. Baseline: RUE 30 lbs, LUE 10 lbs. Goal status: met 17 lbs 01/24/24  6.  Pt will verbalize understanding of positioning to minimize UE pain. Goal status: ongoing discussed pillow under LUE, avoiding reaching with UE's, thumb CMC splint fitted on 02/12/23  LONG TERM GOALS: Target date: 03/27/24  I with updated HEP  Goal status: ongoing, 03/04/24  2.  Pt will increase LUE box/ blocks score to 16 blocks  Goal status: ongoing 03/04/24  3.  Pt will use LUE as a gross assist at least 15% of the time. Baseline: uses 5% Goal status: ongoing 03/04/24,5%   4.  Pt will report decreased drops with LUE. Baseline: frequent drops Goal status: INITIAL 5. Pt will verbalize understanding of AE/DME to maximize safety/ I including possible walker splint prn.  Goal status: ongoing, pt stood today without walker splint, she does not need walker splint at this time. 02/26/24 6. Pt will increase LUE grip strength to 20 lbs for increased functional use. (new 01/24/24 since short term goal met.) Baseline: RUE 30 lbs, LUE 10 lbs. Goal status:  met 21 lbs 03/04/24 ASSESSMENT:  CLINICAL IMPRESSION:Pt is progressing towards goals. Pt was able to stand at the sink today with min A for balance while weightbearing and reaching with LUE.  PERFORMANCE DEFICITS: in functional skills including ADLs, IADLs, coordination, dexterity, sensation, tone, ROM, strength, flexibility, Fine motor control, Gross motor  control, mobility, balance, endurance, decreased knowledge of precautions, decreased knowledge of use of DME, and UE functional use, cognitive skills and psychosocial skills including coping strategies, environmental adaptation, habits, interpersonal interactions, and routines and behaviors.   IMPAIRMENTS: are limiting patient from ADLs, IADLs, rest and sleep, play, leisure, and social participation.   CO-MORBIDITIES: may have co-morbidities  that affects occupational performance. Patient will benefit from skilled OT to address above impairments and improve overall function.  MODIFICATION OR ASSISTANCE TO COMPLETE EVALUATION: No modification of tasks or assist necessary to complete an evaluation.  OT OCCUPATIONAL PROFILE AND HISTORY: Detailed assessment: Review  of records and additional review of physical, cognitive, psychosocial history related to current functional performance.  CLINICAL DECISION MAKING: LOW - limited treatment options, no task modification necessary  REHAB POTENTIAL: Good  EVALUATION COMPLEXITY: Low    PLAN:  OT FREQUENCY: 2x/week  OT DURATION: 12 weeks  PLANNED INTERVENTIONS: 97168 OT Re-evaluation, 97535 self care/ADL training, 02889 therapeutic exercise, 97530 therapeutic activity, 97112 neuromuscular re-education, 97140 manual therapy, 97035 ultrasound, 97018 paraffin, 02989 moist heat, 97010 cryotherapy, 97034 contrast bath, 97014 electrical stimulation unattended, passive range of motion, balance training, functional mobility training, psychosocial skills training, energy conservation, coping strategies training, patient/family education, and DME and/or AE instructions  RECOMMENDED OTHER SERVICES: n/a  CONSULTED AND AGREED WITH PLAN OF CARE: Patient  PLAN FOR NEXT SESSION:    standing with walker for weightbearing through LUE for increased control, start checking long term goals, plan to d/c vs renew at end of month.   Tinya Cadogan, OT 03/11/2024, 12:16  PM

## 2024-03-18 ENCOUNTER — Encounter: Payer: Self-pay | Admitting: Occupational Therapy

## 2024-03-18 ENCOUNTER — Ambulatory Visit: Admitting: Occupational Therapy

## 2024-03-18 DIAGNOSIS — I69354 Hemiplegia and hemiparesis following cerebral infarction affecting left non-dominant side: Secondary | ICD-10-CM | POA: Diagnosis not present

## 2024-03-18 DIAGNOSIS — R278 Other lack of coordination: Secondary | ICD-10-CM | POA: Diagnosis not present

## 2024-03-18 DIAGNOSIS — R2681 Unsteadiness on feet: Secondary | ICD-10-CM | POA: Diagnosis not present

## 2024-03-18 DIAGNOSIS — R293 Abnormal posture: Secondary | ICD-10-CM | POA: Diagnosis not present

## 2024-03-18 DIAGNOSIS — M6281 Muscle weakness (generalized): Secondary | ICD-10-CM

## 2024-03-18 DIAGNOSIS — R270 Ataxia, unspecified: Secondary | ICD-10-CM | POA: Diagnosis not present

## 2024-03-18 NOTE — Therapy (Signed)
 OUTPATIENT OCCUPATIONAL THERAPY NEURO Treatment  Patient Name: Karen Jackson MRN: [id number] DOB:Jan 02, 1941, 83 y.o., female Today's Date: 03/18/2024  PCP: Dr. Shayne REFERRING PROVIDER: Dr. Shayne  END OF SESSION:  OT End of Session - 03/18/24 1106     Visit Number 16    Number of Visits 25    Date for OT Re-Evaluation 03/27/24    Authorization Type MCR and  [employer]    Authorization - Visit Number 16    Progress Note Due on Visit 20    OT Start Time 1058    OT Stop Time 1138    OT Time Calculation (min) 40 min                     Past Medical History:  Diagnosis Date   Acute cystitis without hematuria    Acute diastolic CHF (congestive heart failure) (HCC)    Arthritis    Dyspnea    Dysrhythmia    Fever of unknown origin 03/19/2017   Hyperlipidemia    Hypertension    denies at preop   Multifocal pneumonia    Neuromuscular disorder (HCC)    neuropathy left arm and foot   Osteopenia    Paralysis (HCC)    partial left side from CVA    Persistent atrial fibrillation (HCC)    PONV (postoperative nausea and vomiting)    Pre-diabetes    Stroke [facility]) 2013   hemmorahgic   Past Surgical History:  Procedure Laterality Date   ANKLE SURGERY     APPENDECTOMY     CHOLECYSTECTOMY     HARDWARE REMOVAL Left 03/29/2023   Procedure: HARDWARE REMOVAL;  Surgeon: Kit Rush, MD;  Location: [facility];  Service: Orthopedics;  Laterality: Left;   HERNIA REPAIR     Esophagus   INCISION AND DRAINAGE OF WOUND Left 03/29/2023   Procedure: LEFT ANKLE WOUND IRRIGATION AND DEBRIDEMENT WOUND;  Surgeon: Kit Rush, MD;  Location: [facility];  Service: Orthopedics;  Laterality: Left;   JOINT REPLACEMENT     total- right partial- left   MASTECTOMY PARTIAL / LUMPECTOMY  2012   left   ORIF ANKLE FRACTURE Left 07/20/2018   Procedure: OPEN REDUCTION INTERNAL FIXATION (ORIF) ANKLE FRACTURE;  Surgeon: Kit Rush, MD;  Location: [facility];  Service:  Orthopedics;  Laterality: Left;   TOTAL KNEE ARTHROPLASTY Left 01/27/2019   Procedure: TOTAL KNEE ARTHROPLASTY;  Surgeon: Rubie Kemps, MD;  Location: [facility];  Service: Orthopedics;  Laterality: Left;   Patient Active Problem List   Diagnosis Date Noted   Goals of care, counseling/discussion 01/17/2022   Grief 01/17/2022   Secondary hypercoagulable state (HCC) 03/16/2021   Dizziness 03/10/2021   Orthostatic hypotension 10/15/2020   Presbycusis of both ears 03/08/2020   Mixed stress and urge urinary incontinence 12/02/2019   Macrocytosis 12/01/2019   Nutritional anemia 12/01/2019   S/P total knee replacement 01/27/2019   Recurrent left knee instability 07/05/2018   Respiratory failure with hypoxia (HCC) 08/30/2017   Hypoxemia    Heart failure with preserved ejection fraction (HCC), Grade 3 diastolic dysfunction 03/26/2017   PAF (paroxysmal atrial fibrillation) (HCC)    Dyspnea 03/19/2017   Encounter for preventive health examination 02/17/2016   Sensorineural hearing loss (SNHL), bilateral 01/26/2016   Hypomagnesemia 04/24/2014   Hemiparesis affecting left side as late effect of cerebrovascular accident (HCC) 04/24/2014   Nontraumatic cerebral hemorrhage (HCC) 04/30/2012   DM (diabetes mellitus) with complications (HCC) 03/04/2010   OSTEOPENIA 01/21/2009  UNSPECIFIED VITAMIN D  DEFICIENCY 11/19/2007   HYPERCHOLESTEROLEMIA 10/25/2006   GASTROESOPHAGEAL REFLUX, NO ESOPHAGITIS 10/25/2006   DIVERTICULOSIS OF COLON 10/25/2006   Osteoarthritis 10/25/2006   CERVICAL SPINE DISORDER, NOS 10/25/2006    ONSET DATE: 12/03/23- referral date  REFERRING DIAG: I69.354 (ICD-10-CM) - Hemiplegia and hemiparesis following cerebral infarction affecting left non-dominant side   THERAPY DIAG:  Muscle weakness (generalized)  Hemiplegia and hemiparesis following cerebral infarction affecting left non-dominant side (HCC)  Abnormal posture  Other lack of coordination  Ataxia  Rationale for  Evaluation and Treatment: Rehabilitation  SUBJECTIVE:   SUBJECTIVE STATEMENT: Pt reports her pain is not bad Pt accompanied by: self  PERTINENT HISTORY: CVA 2013, CHF, HTN, A-fib, TKA, s/p ORIF left ankle hardware removal, s/p injection to bilateral shoulders 01/02/24  PRECAUTIONS: Fall, no therapy to bilateral shoulders per MD  WEIGHT BEARING RESTRICTIONS: No  PAIN:  Are you having pain? Yes: NPRS scale: 5/10 thumb/ hand, shoulders 7/10,  Pain location: bilateral shoulders, left wrist Pain description: aching, sharp Aggravating factors: movement, Relieving factors: rest  FALLS: Has patient fallen in last 6 months? No  LIVING ENVIRONMENT: Lives with: has caregiver 8-2, 4:30-8pm Lives in: House/apartment Stairs: No, has ramp Has following equipment at home: Walker - 2 wheeled, shower chair, and Ramped entry  PLOF: Needs assistance with homemaking, Needs assistance with transfers, ADLS, plays bridge, she has  an aide at home that helps with meals, going to MD's, dressing and bathing, is alone at night    PATIENT GOALS: improve functional use of LUE  OBJECTIVE:  Note: Objective measures were completed at Evaluation unless otherwise noted.  HAND DOMINANCE: Right  ADLs: Overall ADLs: requires assist Transfers/ambulation related to ADLs: Eating: needs assit with cutting food Grooming: set up UB Dressing: mod A/ max  LB Dressing: max  Toileting: has grab bar, min A Bathing: min-mod A Tub Shower transfers: min-mod A to rolling shower chair, then caregivers roll you    IADLs:caregiver's perform IADLS/ home management  Medication management: pt and caregiver complete together  Financial management: nephew handles  MOBILITY STATUS: Needs Assist: Pt requires min-mod A for transfers, pt does not walk with caregiver's  POSTURE COMMENTS:  rounded shoulders and forward head       UPPER EXTREMITY ROM:  RUE elbow wrist and hand grossly WFL, shoulder flexion limited to  50* due to arthritis  Active ROM Right eval Left eval  Shoulder flexion  45  Shoulder abduction    Shoulder adduction    Shoulder extension    Shoulder internal rotation    Shoulder external rotation    Elbow flexion  WFL  Elbow extension  WFL  Wrist flexion  40  Wrist extension  30  Wrist ulnar deviation    Wrist radial deviation    Wrist pronation  WFL  Wrist supination  80%  (Blank rows = not tested)   HAND FUNCTION: Grip strength: Right: 30 lbs; Left: 10 lbs  COORDINATION: Box/ blocks LUE 13 blocks in 1 min   SENSATION: Light touch: Impaired     MUSCLE TONE: LUE: Mild and Hypertonic  COGNITION: Overall cognitive status: Within functional limits for tasks assessed    VISION ASSESSMENT:NT, wear glasses for distance vision  TREATMENT DATE:7/22/25Paraffin to L hand and wrist x 8 mins for stiffness,no adverse reactions.  Sit-stand at sink min A-mod A, min A while standing  with walker and perfoming lateral weight shifts, min-mod v.c for posture. Picking up checkers to place in container with LUE, min difficulty/ dops, min v.c. Therapist checked progress towards box/blocks: 17 blocks with LUE, meeting goal. Functional reaching on a low surface to grasp wooden dowel pegs to remove from pegboard then replace, min difficulty removing, min-mod difficulty/ drops returning pegs to pegboard, min v.c. Discussed plans to renew next visit.  7/15/25Paraffin to L hand and wrist x 9 mins for stiffness,no dverse reactions. Functional reaching on a low surface to grasp wooden dowel pegs to remove from pegboard then replace. Min difficulty removing, mod difficulty/ drops returning pegs to pegboard, min v.c. Sit-stand at sink min A-mod A, min A while standing and perfoming functional reach with left and right UE's, weightbearing through other hand when  reaching with one. Pt reported that her leg brace was rubbing her, so therapist took ankle brace off and replaced. Pt has bandage over lateral ankle. After therapist repositioned brace pt reported improved comfort. Picking up checkers to place in container with LUe, min difficulty/ dops, min v.c. Pt forgot her thumb brace today however she was sucessful with her use of LUE.     03/04/24- Pt with reports of increased pain today.Paraffin to L hand and wrist x 8 mins for stiffness, along with hotpack to neck and shoulders, no adverse reactions, Pt reports ankle pain so she declined standing Fine motor coordination activities: flipping playing cards, picking up checkers to place in container min difficulty wearing thumb brace. Removing wooden pegs with only min difficulty wearing brace, replacing with min-mod difficulty/ drops.   02/26/24-discussed risks and benefits of a walker splint with PT and patient. Pt. performed standing with walker x 3 reps, for short duration without a walker splint for increased weightbearing. Initally 2 person assist due pt apprehension, then pt was able to perform sit to stand with min A pulling up on walker with 1 person. Paraffin to L hand and wrist x 8 mins for stiffness,no adverse reactions Pt's CMC splint was applied then pt placed and removed wooden dowel pegs, min drops, v.c and increased  time Putty exercises for sustained grip and pinch min-mod difficulty/ v.c    Education details:  see above Person educated: Patient Education method: Programmer, multimedia, Demonstration, Verbal cues,  Education comprehension: verbalized understanding, returned demonstration, verbal cues required, and tactile cues required   HOME EXERCISE PROGRAM: 01/07/24 A/ROM, light functional use of LUE, cards, checkers yellow putty  GOALS: Goals reviewed with patient? Yes  SHORT TERM GOALS: Target date: 02/03/24  I with inital HEP.  Goal status: met, pt is performing at home. 01/22/24  2.   Pt will increase A/ROM wrist extension to 40* for improved functional use. Baseline: 30* Goal status: met 50* 01/17/24  3.  Pt will increase LUE box/ blocks score to 16 blocks Baseline: 13 blocks Goal status: met 17 blocks 03/18/24  4.  Pt will increase A/ROM wrist flexion to 50* for improved functional use.  Goal status met 50* 01/17/24  5.  Pt will increase LUE grip strength to 15 lbs for increased functional use. Baseline: RUE 30 lbs, LUE 10 lbs. Goal status: met 17 lbs 01/24/24  6.  Pt will verbalize understanding of positioning to minimize UE pain. Goal status: ongoing discussed pillow under LUE, avoiding reaching with UE's, thumb CMC splint  fitted on 02/12/23  LONG TERM GOALS: Target date: 03/27/24  I with updated HEP  Goal status: ongoing, 03/04/24  2.  Pt will increase LUE box/ blocks score to 16 blocks  Goal status: met 17 blocks 03/18/24  3.  Pt will use LUE as a gross assist at least 15% of the time. Baseline: uses 5% Goal status: ongoing 03/04/24,5%   4.  Pt will report decreased drops with LUE. Baseline: frequent drops Goal status: INITIAL 5. Pt will verbalize understanding of AE/DME to maximize safety/ I including possible walker splint prn.  Goal status: ongoing, pt stood today without walker splint, she does not need walker splint at this time. 02/26/24 6. Pt will increase LUE grip strength to 20 lbs for increased functional use. (new 01/24/24 since short term goal met.) Baseline: RUE 30 lbs, LUE 10 lbs. Goal status:  met 21 lbs 03/04/24 ASSESSMENT:  CLINICAL IMPRESSION:Pt is progressing towards goals. Pt met long term goal #2 for box and blocks. She dmeonstrates improved LUE functional use. PERFORMANCE DEFICITS: in functional skills including ADLs, IADLs, coordination, dexterity, sensation, tone, ROM, strength, flexibility, Fine motor control, Gross motor control, mobility, balance, endurance, decreased knowledge of precautions, decreased knowledge of use of DME, and UE  functional use, cognitive skills and psychosocial skills including coping strategies, environmental adaptation, habits, interpersonal interactions, and routines and behaviors.   IMPAIRMENTS: are limiting patient from ADLs, IADLs, rest and sleep, play, leisure, and social participation.   CO-MORBIDITIES: may have co-morbidities  that affects occupational performance. Patient will benefit from skilled OT to address above impairments and improve overall function.  MODIFICATION OR ASSISTANCE TO COMPLETE EVALUATION: No modification of tasks or assist necessary to complete an evaluation.  OT OCCUPATIONAL PROFILE AND HISTORY: Detailed assessment: Review of records and additional review of physical, cognitive, psychosocial history related to current functional performance.  CLINICAL DECISION MAKING: LOW - limited treatment options, no task modification necessary  REHAB POTENTIAL: Good  EVALUATION COMPLEXITY: Low    PLAN:  OT FREQUENCY: 2x/week  OT DURATION: 12 weeks  PLANNED INTERVENTIONS: 97168 OT Re-evaluation, 97535 self care/ADL training, 02889 therapeutic exercise, 97530 therapeutic activity, 97112 neuromuscular re-education, 97140 manual therapy, 97035 ultrasound, 97018 paraffin, 02989 moist heat, 97010 cryotherapy, 97034 contrast bath, 97014 electrical stimulation unattended, passive range of motion, balance training, functional mobility training, psychosocial skills training, energy conservation, coping strategies training, patient/family education, and DME and/or AE instructions  RECOMMENDED OTHER SERVICES: n/a  CONSULTED AND AGREED WITH PLAN OF CARE: Patient  PLAN FOR NEXT SESSION:    standing with walker for weightbearing through LUE for increased control,  plan to renew at end of month.   Reni Hausner, OT 03/18/2024, 11:08 AM

## 2024-03-25 ENCOUNTER — Ambulatory Visit: Admitting: Occupational Therapy

## 2024-03-25 ENCOUNTER — Encounter: Payer: Self-pay | Admitting: Occupational Therapy

## 2024-03-25 DIAGNOSIS — R278 Other lack of coordination: Secondary | ICD-10-CM | POA: Diagnosis not present

## 2024-03-25 DIAGNOSIS — M6281 Muscle weakness (generalized): Secondary | ICD-10-CM | POA: Diagnosis not present

## 2024-03-25 DIAGNOSIS — R293 Abnormal posture: Secondary | ICD-10-CM

## 2024-03-25 DIAGNOSIS — R2681 Unsteadiness on feet: Secondary | ICD-10-CM | POA: Diagnosis not present

## 2024-03-25 DIAGNOSIS — R270 Ataxia, unspecified: Secondary | ICD-10-CM | POA: Diagnosis not present

## 2024-03-25 DIAGNOSIS — I69354 Hemiplegia and hemiparesis following cerebral infarction affecting left non-dominant side: Secondary | ICD-10-CM | POA: Diagnosis not present

## 2024-03-25 NOTE — Therapy (Incomplete)
 OUTPATIENT OCCUPATIONAL THERAPY NEURO Treatment  Patient Name: Karen Jackson MRN: [id number] DOB:1940/10/25, 83 y.o., female Today's Date: 03/26/2024  PCP: Dr. Shayne REFERRING PROVIDER: Dr. Shayne  END OF SESSION:  OT End of Session - 03/25/24 1107     Visit Number 17    Number of Visits 25    Date for OT Re-Evaluation 06/17/24    Authorization Type MCR and  [employer]    Authorization - Visit Number 17    Progress Note Due on Visit 20    OT Start Time 1100    OT Stop Time 1143    OT Time Calculation (min) 43 min                      Past Medical History:  Diagnosis Date   Acute cystitis without hematuria    Acute diastolic CHF (congestive heart failure) (HCC)    Arthritis    Dyspnea    Dysrhythmia    Fever of unknown origin 03/19/2017   Hyperlipidemia    Hypertension    denies at preop   Multifocal pneumonia    Neuromuscular disorder (HCC)    neuropathy left arm and foot   Osteopenia    Paralysis (HCC)    partial left side from CVA    Persistent atrial fibrillation (HCC)    PONV (postoperative nausea and vomiting)    Pre-diabetes    Stroke [facility]) 2013   hemmorahgic   Past Surgical History:  Procedure Laterality Date   ANKLE SURGERY     APPENDECTOMY     CHOLECYSTECTOMY     HARDWARE REMOVAL Left 03/29/2023   Procedure: HARDWARE REMOVAL;  Surgeon: Kit Rush, MD;  Location: [facility];  Service: Orthopedics;  Laterality: Left;   HERNIA REPAIR     Esophagus   INCISION AND DRAINAGE OF WOUND Left 03/29/2023   Procedure: LEFT ANKLE WOUND IRRIGATION AND DEBRIDEMENT WOUND;  Surgeon: Kit Rush, MD;  Location: [facility];  Service: Orthopedics;  Laterality: Left;   JOINT REPLACEMENT     total- right partial- left   MASTECTOMY PARTIAL / LUMPECTOMY  2012   left   ORIF ANKLE FRACTURE Left 07/20/2018   Procedure: OPEN REDUCTION INTERNAL FIXATION (ORIF) ANKLE FRACTURE;  Surgeon: Kit Rush, MD;  Location: [facility];  Service:  Orthopedics;  Laterality: Left;   TOTAL KNEE ARTHROPLASTY Left 01/27/2019   Procedure: TOTAL KNEE ARTHROPLASTY;  Surgeon: Rubie Kemps, MD;  Location: [facility];  Service: Orthopedics;  Laterality: Left;   Patient Active Problem List   Diagnosis Date Noted   Goals of care, counseling/discussion 01/17/2022   Grief 01/17/2022   Secondary hypercoagulable state (HCC) 03/16/2021   Dizziness 03/10/2021   Orthostatic hypotension 10/15/2020   Presbycusis of both ears 03/08/2020   Mixed stress and urge urinary incontinence 12/02/2019   Macrocytosis 12/01/2019   Nutritional anemia 12/01/2019   S/P total knee replacement 01/27/2019   Recurrent left knee instability 07/05/2018   Respiratory failure with hypoxia (HCC) 08/30/2017   Hypoxemia    Heart failure with preserved ejection fraction (HCC), Grade 3 diastolic dysfunction 03/26/2017   PAF (paroxysmal atrial fibrillation) (HCC)    Dyspnea 03/19/2017   Encounter for preventive health examination 02/17/2016   Sensorineural hearing loss (SNHL), bilateral 01/26/2016   Hypomagnesemia 04/24/2014   Hemiparesis affecting left side as late effect of cerebrovascular accident (HCC) 04/24/2014   Nontraumatic cerebral hemorrhage (HCC) 04/30/2012   DM (diabetes mellitus) with complications (HCC) 03/04/2010   OSTEOPENIA 01/21/2009  UNSPECIFIED VITAMIN D  DEFICIENCY 11/19/2007   HYPERCHOLESTEROLEMIA 10/25/2006   GASTROESOPHAGEAL REFLUX, NO ESOPHAGITIS 10/25/2006   DIVERTICULOSIS OF COLON 10/25/2006   Osteoarthritis 10/25/2006   CERVICAL SPINE DISORDER, NOS 10/25/2006    ONSET DATE: 12/03/23- referral date  REFERRING DIAG: I69.354 (ICD-10-CM) - Hemiplegia and hemiparesis following cerebral infarction affecting left non-dominant side   THERAPY DIAG:  Muscle weakness (generalized) - Plan: Ot plan of care cert/re-cert  Hemiplegia and hemiparesis following cerebral infarction affecting left non-dominant side (HCC) - Plan: Ot plan of care  cert/re-cert  Abnormal posture - Plan: Ot plan of care cert/re-cert  Other lack of coordination - Plan: Ot plan of care cert/re-cert  Ataxia - Plan: Ot plan of care cert/re-cert  Unsteadiness on feet  Rationale for Evaluation and Treatment: Rehabilitation  SUBJECTIVE:   SUBJECTIVE STATEMENT: Pt reports her pain is not bad Pt accompanied by: self  PERTINENT HISTORY: CVA 2013, CHF, HTN, A-fib, TKA, s/p ORIF left ankle hardware removal, s/p injection to bilateral shoulders 01/02/24  PRECAUTIONS: Fall, no therapy to bilateral shoulders per MD  WEIGHT BEARING RESTRICTIONS: No  PAIN:  Are you having pain? Yes: NPRS scale: 4/10 thumb/ hand, shoulders 4/10,  Pain location: bilateral shoulders, left wrist Pain description: aching,  Aggravating factors: movement, Relieving factors: rest  FALLS: Has patient fallen in last 6 months? No  LIVING ENVIRONMENT: Lives with: has caregiver 8-2, 4:30-8pm Lives in: House/apartment Stairs: No, has ramp Has following equipment at home: Walker - 2 wheeled, shower chair, and Ramped entry  PLOF: Needs assistance with homemaking, Needs assistance with transfers, ADLS, plays bridge, she has  an aide at home that helps with meals, going to MD's, dressing and bathing, is alone at night    PATIENT GOALS: improve functional use of LUE  OBJECTIVE:  Note: Objective measures were completed at Evaluation unless otherwise noted.  HAND DOMINANCE: Right  ADLs: Overall ADLs: requires assist Transfers/ambulation related to ADLs: Eating: needs assit with cutting food Grooming: set up UB Dressing: mod A/ max  LB Dressing: max  Toileting: has grab bar, min A Bathing: min-mod A Tub Shower transfers: min-mod A to rolling shower chair, then caregivers roll you    IADLs:caregiver's perform IADLS/ home management  Medication management: pt and caregiver complete together  Financial management: nephew handles  MOBILITY STATUS: Needs Assist: Pt requires  min-mod A for transfers, pt does not walk with caregiver's  POSTURE COMMENTS:  rounded shoulders and forward head       UPPER EXTREMITY ROM:  RUE elbow wrist and hand grossly WFL, shoulder flexion limited to 50* due to arthritis  Active ROM Right eval Left eval  Shoulder flexion  45  Shoulder abduction    Shoulder adduction    Shoulder extension    Shoulder internal rotation    Shoulder external rotation    Elbow flexion  WFL  Elbow extension  WFL  Wrist flexion  40  Wrist extension  30  Wrist ulnar deviation    Wrist radial deviation    Wrist pronation  WFL  Wrist supination  80%  (Blank rows = not tested)   HAND FUNCTION: Grip strength: Right: 30 lbs; Left: 10 lbs  COORDINATION: Box/ blocks LUE 13 blocks in 1 min   SENSATION: Light touch: Impaired     MUSCLE TONE: LUE: Mild and Hypertonic  COGNITION: Overall cognitive status: Within functional limits for tasks assessed    VISION ASSESSMENT:NT, wear glasses for distance vision  TREATMENT DATE:03/25/24-Paraffin to L hand and wrist x 8 mins for stiffness,no adverse reactions.  Sit-stand at sink min A-mod A, min A while standing  with walker. Pt was able to stand x 1 min prior to rest break. Therapist checked progress toward remaining goals in prep for renewal. Functional reaching on a low surface to grasp wooden dowel pegs to remove from pegboard then replace while wearing brace, min difficulty removing, min-mod difficulty/ drops returning pegs to pegboard, min v.c. Flipping playing cards with LUE for increased fine motor coordination. Gentle joint mobs and soft tissue mobs to left forearm, wrist and hand due to tightness. No adverse reactions.  7/22/25Paraffin to L hand and wrist x 8 mins for stiffness,no adverse reactions.  Sit-stand at sink min A-mod A, min A while standing   with walker and perfoming lateral weight shifts, min-mod v.c for posture. Picking up checkers to place in container with LUE, min difficulty/ dops, min v.c. Therapist checked progress towards box/blocks: 17 blocks with LUE, meeting goal. Functional reaching on a low surface to grasp wooden dowel pegs to remove from pegboard then replace, min difficulty removing, min-mod difficulty/ drops returning pegs to pegboard, min v.c. Discussed plans to renew next visit.  7/15/25Paraffin to L hand and wrist x 9 mins for stiffness,no dverse reactions. Functional reaching on a low surface to grasp wooden dowel pegs to remove from pegboard then replace. Min difficulty removing, mod difficulty/ drops returning pegs to pegboard, min v.c. Sit-stand at sink min A-mod A, min A while standing and perfoming functional reach with left and right UE's, weightbearing through other hand when reaching with one. Pt reported that her leg brace was rubbing her, so therapist took ankle brace off and replaced. Pt has bandage over lateral ankle. After therapist repositioned brace pt reported improved comfort. Picking up checkers to place in container with LUe, min difficulty/ dops, min v.c. Pt forgot her thumb brace today however she was sucessful with her use of LUE.     03/04/24- Pt with reports of increased pain today.Paraffin to L hand and wrist x 8 mins for stiffness, along with hotpack to neck and shoulders, no adverse reactions, Pt reports ankle pain so she declined standing Fine motor coordination activities: flipping playing cards, picking up checkers to place in container min difficulty wearing thumb brace. Removing wooden pegs with only min difficulty wearing brace, replacing with min-mod difficulty/ drops.   02/26/24-discussed risks and benefits of a walker splint with PT and patient. Pt. performed standing with walker x 3 reps, for short duration without a walker splint for increased weightbearing. Initally 2 person  assist due pt apprehension, then pt was able to perform sit to stand with min A pulling up on walker with 1 person. Paraffin to L hand and wrist x 8 mins for stiffness,no adverse reactions Pt's CMC splint was applied then pt placed and removed wooden dowel pegs, min drops, v.c and increased  time Putty exercises for sustained grip and pinch min-mod difficulty/ v.c    Education details:  see above Person educated: Patient Education method: Programmer, multimedia, Demonstration, Verbal cues,  Education comprehension: verbalized understanding, returned demonstration, verbal cues required, and tactile cues required   HOME EXERCISE PROGRAM: 01/07/24 A/ROM, light functional use of LUE, cards, checkers yellow putty  GOALS: Goals reviewed with patient? Yes  SHORT TERM GOALS: Target date: 04/25/24  I with inital HEP.  Goal status: met, pt is performing at home. 01/22/24  2.  Pt will increase A/ROM wrist extension to  40* for improved functional use. Baseline: 30* Goal status: met 50* 01/17/24  3.  Pt will increase LUE box/ blocks score to 16 blocks Revised goal: Pt will demonstrate improved LUE finctional use for ADLs as evidenced by increasing box/ blocks score to 20 blocks or greater with LUE. Baseline: 13 blocks Goal status: met 17 blocks 03/18/24, continue with upgraded goal  4.  Pt will increase A/ROM wrist flexion to 50* for improved functional use.  Goal status met 50* 01/17/24  5.  Pt will increase LUE grip strength to 15 lbs for increased functional use. Baseline: RUE 30 lbs, LUE 10 lbs. Goal status: met 17 lbs 01/24/24  6.  Pt will verbalize understanding of positioning to minimize UE pain. Goal status: met discussed pillow under LUE, avoiding reaching with UE's, thumb CMC splint fitted on 02/12/23,  .7. Pt will consisently demonstrate ability to stand x 2 mins with walker with min A for increased ease with ADLs.  Baseline: 03/25/24- 1 min            Goal status: new 8. Pt will report LUE  pain no greater than 3/10.  Goal status new  LONG TERM GOALS: Target date:   06/25/24  I with updated HEP  Goal status: ongoing, 03/25/24  2.  Pt will increase LUE box/ blocks score to 16 blocks  Goal status: met 17 blocks 03/18/24  3.  Pt will use LUE as a gross assist at least 15% of the time for ADLS/IADLs Baseline: uses 5% Goal status: ongoing, uses 10% 03/25/24,  4.  Pt will report decreased drops with LUE. Baseline: frequent drops Goal status:  met, pt reports decreased dropping of items. 03/25/24  5. Pt will verbalize understanding of AE/DME to maximize safety/ I including possible walker splint prn.  Goal status: ongoing, pt stood today without walker splint, she does not need walker splint at this time. 02/26/24  6. Pt will increase LUE grip strength to 20 lbs for increased functional use. Upgraded goal:Pt will increase LUE grip strength to 23 lbs for increased functional use during ADLs. Baseline: RUE 30 lbs, LUE 10 lbs. Goal status:  met 21 lbs 03/04/24, continue with upgraded goal  7. Pt will demonstrate ability to stand x 3 mins with min A with walker for increased ease with ADLs.     Goal status: new 8. Pt will consistently perfrom sit to stand with min A for increased ease with toileting  Goal status:new  ASSESSMENT:  CLINICAL IMPRESSION:Pt has made good overall progress. she achieved all short term goals and  /6 long term goals. Pt presents with the performance deficits below. She can benefit from continued skilled occupational therapy to address these deficits in order to maximize pt's safety, I with ADLs and quality of life.  PERFORMANCE DEFICITS: in functional skills including ADLs, IADLs, coordination, dexterity, sensation, tone, ROM, strength, flexibility, Fine motor control, Gross motor control, mobility, balance, endurance, decreased knowledge of precautions, decreased knowledge of use of DME, and UE functional use, cognitive skills and psychosocial skills  including coping strategies, environmental adaptation, habits, interpersonal interactions, and routines and behaviors.   IMPAIRMENTS: are limiting patient from ADLs, IADLs, rest and sleep, play, leisure, and social participation.   CO-MORBIDITIES: may have co-morbidities  that affects occupational performance. Patient will benefit from skilled OT to address above impairments and improve overall function.  MODIFICATION OR ASSISTANCE TO COMPLETE EVALUATION: No modification of tasks or assist necessary to complete an evaluation.  OT OCCUPATIONAL PROFILE AND HISTORY:  Detailed assessment: Review of records and additional review of physical, cognitive, psychosocial history related to current functional performance.  CLINICAL DECISION MAKING: LOW - limited treatment options, no task modification necessary  REHAB POTENTIAL: Good  EVALUATION COMPLEXITY: Low    PLAN:  OT FREQUENCY: 1x week  OT DURATION: 12 weeks, may d/c after 8 weeks  PLANNED INTERVENTIONS: 97168 OT Re-evaluation, 97535 self care/ADL training, 02889 therapeutic exercise, 97530 therapeutic activity, 97112 neuromuscular re-education, 97140 manual therapy, 97035 ultrasound, 97018 paraffin, 02989 moist heat, 97010 cryotherapy, 97034 contrast bath, 97014 electrical stimulation unattended, passive range of motion, balance training, functional mobility training, psychosocial skills training, energy conservation, coping strategies training, patient/family education, and DME and/or AE instructions  RECOMMENDED OTHER SERVICES: n/a  CONSULTED AND AGREED WITH PLAN OF CARE: Patient  PLAN FOR NEXT SESSION:   functional use of LUE,  standing with walker for weightbearing through LUE for increased control,  functional use of LUE   Janeth Terry, OT 03/26/2024, 12:06 PM

## 2024-03-27 DIAGNOSIS — H6123 Impacted cerumen, bilateral: Secondary | ICD-10-CM | POA: Diagnosis not present

## 2024-04-01 ENCOUNTER — Encounter: Payer: Self-pay | Admitting: Occupational Therapy

## 2024-04-01 ENCOUNTER — Ambulatory Visit: Attending: Internal Medicine | Admitting: Occupational Therapy

## 2024-04-01 DIAGNOSIS — I69354 Hemiplegia and hemiparesis following cerebral infarction affecting left non-dominant side: Secondary | ICD-10-CM | POA: Diagnosis not present

## 2024-04-01 DIAGNOSIS — R270 Ataxia, unspecified: Secondary | ICD-10-CM | POA: Insufficient documentation

## 2024-04-01 DIAGNOSIS — R278 Other lack of coordination: Secondary | ICD-10-CM | POA: Diagnosis not present

## 2024-04-01 DIAGNOSIS — R293 Abnormal posture: Secondary | ICD-10-CM | POA: Diagnosis not present

## 2024-04-01 DIAGNOSIS — M25512 Pain in left shoulder: Secondary | ICD-10-CM | POA: Diagnosis not present

## 2024-04-01 DIAGNOSIS — R2681 Unsteadiness on feet: Secondary | ICD-10-CM | POA: Insufficient documentation

## 2024-04-01 DIAGNOSIS — M6281 Muscle weakness (generalized): Secondary | ICD-10-CM | POA: Diagnosis not present

## 2024-04-01 NOTE — Therapy (Signed)
 OUTPATIENT OCCUPATIONAL THERAPY NEURO Treatment  Patient Name: Karen Jackson MRN: [id number] DOB:Sep 19, 1940, 83 y.o., female Today's Date: 04/01/2024  PCP: Dr. Shayne REFERRING PROVIDER: Dr. Shayne  END OF SESSION:  OT End of Session - 04/01/24 1111     Visit Number 18    Number of Visits 25    Date for OT Re-Evaluation 06/17/24    Authorization Type MCR and  [employer]    Authorization Time Period 12 weeks    Authorization - Visit Number 18    Progress Note Due on Visit 20    OT Start Time 1102    OT Stop Time 1142    OT Time Calculation (min) 40 min    Activity Tolerance Patient tolerated treatment well    Behavior During Therapy WFL for tasks assessed/performed                      Past Medical History:  Diagnosis Date   Acute cystitis without hematuria    Acute diastolic CHF (congestive heart failure) (HCC)    Arthritis    Dyspnea    Dysrhythmia    Fever of unknown origin 03/19/2017   Hyperlipidemia    Hypertension    denies at preop   Multifocal pneumonia    Neuromuscular disorder (HCC)    neuropathy left arm and foot   Osteopenia    Paralysis (HCC)    partial left side from CVA    Persistent atrial fibrillation (HCC)    PONV (postoperative nausea and vomiting)    Pre-diabetes    Stroke [facility]) 2013   hemmorahgic   Past Surgical History:  Procedure Laterality Date   ANKLE SURGERY     APPENDECTOMY     CHOLECYSTECTOMY     HARDWARE REMOVAL Left 03/29/2023   Procedure: HARDWARE REMOVAL;  Surgeon: Kit Rush, MD;  Location: [facility];  Service: Orthopedics;  Laterality: Left;   HERNIA REPAIR     Esophagus   INCISION AND DRAINAGE OF WOUND Left 03/29/2023   Procedure: LEFT ANKLE WOUND IRRIGATION AND DEBRIDEMENT WOUND;  Surgeon: Kit Rush, MD;  Location: [facility];  Service: Orthopedics;  Laterality: Left;   JOINT REPLACEMENT     total- right partial- left   MASTECTOMY PARTIAL / LUMPECTOMY  2012   left   ORIF ANKLE  FRACTURE Left 07/20/2018   Procedure: OPEN REDUCTION INTERNAL FIXATION (ORIF) ANKLE FRACTURE;  Surgeon: Kit Rush, MD;  Location: [facility];  Service: Orthopedics;  Laterality: Left;   TOTAL KNEE ARTHROPLASTY Left 01/27/2019   Procedure: TOTAL KNEE ARTHROPLASTY;  Surgeon: Rubie Kemps, MD;  Location: [facility];  Service: Orthopedics;  Laterality: Left;   Patient Active Problem List   Diagnosis Date Noted   Goals of care, counseling/discussion 01/17/2022   Grief 01/17/2022   Secondary hypercoagulable state (HCC) 03/16/2021   Dizziness 03/10/2021   Orthostatic hypotension 10/15/2020   Presbycusis of both ears 03/08/2020   Mixed stress and urge urinary incontinence 12/02/2019   Macrocytosis 12/01/2019   Nutritional anemia 12/01/2019   S/P total knee replacement 01/27/2019   Recurrent left knee instability 07/05/2018   Respiratory failure with hypoxia (HCC) 08/30/2017   Hypoxemia    Heart failure with preserved ejection fraction (HCC), Grade 3 diastolic dysfunction 03/26/2017   PAF (paroxysmal atrial fibrillation) (HCC)    Dyspnea 03/19/2017   Encounter for preventive health examination 02/17/2016   Sensorineural hearing loss (SNHL), bilateral 01/26/2016   Hypomagnesemia 04/24/2014   Hemiparesis affecting left side as  late effect of cerebrovascular accident (HCC) 04/24/2014   Nontraumatic cerebral hemorrhage (HCC) 04/30/2012   DM (diabetes mellitus) with complications (HCC) 03/04/2010   OSTEOPENIA 01/21/2009   UNSPECIFIED VITAMIN D  DEFICIENCY 11/19/2007   HYPERCHOLESTEROLEMIA 10/25/2006   GASTROESOPHAGEAL REFLUX, NO ESOPHAGITIS 10/25/2006   DIVERTICULOSIS OF COLON 10/25/2006   Osteoarthritis 10/25/2006   CERVICAL SPINE DISORDER, NOS 10/25/2006    ONSET DATE: 12/03/23- referral date  REFERRING DIAG: I69.354 (ICD-10-CM) - Hemiplegia and hemiparesis following cerebral infarction affecting left non-dominant side   THERAPY DIAG:  Muscle weakness (generalized)  Hemiplegia and hemiparesis  following cerebral infarction affecting left non-dominant side (HCC)  Abnormal posture  Other lack of coordination  Unsteadiness on feet  Ataxia  Rationale for Evaluation and Treatment: Rehabilitation  SUBJECTIVE:   SUBJECTIVE STATEMENT: Pt reports her pain is worse with the rain Pt accompanied by: self  PERTINENT HISTORY: CVA 2013, CHF, HTN, A-fib, TKA, s/p ORIF left ankle hardware removal, s/p injection to bilateral shoulders 01/02/24  PRECAUTIONS: Fall, no therapy to bilateral shoulders per MD  WEIGHT BEARING RESTRICTIONS: No  PAIN:  Are you having pain? Yes: NPRS scale: 5/10 thumb/ hand, shoulders 7-8/10,  Pain location: bilateral shoulders, left wrist Pain description: aching,  Aggravating factors: movement, Relieving factors: rest  FALLS: Has patient fallen in last 6 months? No  LIVING ENVIRONMENT: Lives with: has caregiver 8-2, 4:30-8pm Lives in: House/apartment Stairs: No, has ramp Has following equipment at home: Walker - 2 wheeled, shower chair, and Ramped entry  PLOF: Needs assistance with homemaking, Needs assistance with transfers, ADLS, plays bridge, she has  an aide at home that helps with meals, going to MD's, dressing and bathing, is alone at night    PATIENT GOALS: improve functional use of LUE  OBJECTIVE:  Note: Objective measures were completed at Evaluation unless otherwise noted.  HAND DOMINANCE: Right  ADLs: Overall ADLs: requires assist Transfers/ambulation related to ADLs: Eating: needs assit with cutting food Grooming: set up UB Dressing: mod A/ max  LB Dressing: max  Toileting: has grab bar, min A Bathing: min-mod A Tub Shower transfers: min-mod A to rolling shower chair, then caregivers roll you    IADLs:caregiver's perform IADLS/ home management  Medication management: pt and caregiver complete together  Financial management: nephew handles  MOBILITY STATUS: Needs Assist: Pt requires min-mod A for transfers, pt does not walk  with caregiver's  POSTURE COMMENTS:  rounded shoulders and forward head       UPPER EXTREMITY ROM:  RUE elbow wrist and hand grossly WFL, shoulder flexion limited to 50* due to arthritis  Active ROM Right eval Left eval  Shoulder flexion  45  Shoulder abduction    Shoulder adduction    Shoulder extension    Shoulder internal rotation    Shoulder external rotation    Elbow flexion  WFL  Elbow extension  WFL  Wrist flexion  40  Wrist extension  30  Wrist ulnar deviation    Wrist radial deviation    Wrist pronation  WFL  Wrist supination  80%  (Blank rows = not tested)   HAND FUNCTION: Grip strength: Right: 30 lbs; Left: 10 lbs  COORDINATION: Box/ blocks LUE 13 blocks in 1 min   SENSATION: Light touch: Impaired     MUSCLE TONE: LUE: Mild and Hypertonic  COGNITION: Overall cognitive status: Within functional limits for tasks assessed    VISION ASSESSMENT:NT, wear glasses for distance vision  TREATMENT DATE:04/01/24--Paraffin to L hand and wrist x 10 mins for stiffness,no adverse reactions, with hotpack aplied to shoulders and neck x 8 mins (hotpack was wrpped in double layer hospital gown and 2 washcloths positioned to pad neck). when hotpack was removed from neck localized pink coloration was noted. Cool washcloth applied. Pt was in no distress and reported no pain. Functional grasp  of wooden dowel pegs to remove from pegboard  on low surface then replacing while wearing brace, min difficulty removing, min difficulty/ drops returning pegs to pegboard, min v.c. Gentle joint mobs to digits. Flipping playing cards with LUE for increased fine motor coordination, min difficulty v.c shelf liner used. Picking up checkers to place in container with LUE, improved performance and functional grasp/ release today. At end of session, Pt with  mild pink coloration dorsal neck that was resolving. Pt was in no distress and denied pain. Pt was instructed to monitor and let therapist know if any issues.    03/25/24-Paraffin to L hand and wrist x 8 mins for stiffness,no adverse reactions.  Sit-stand at sink min A-mod A, min A while standing  with walker. Pt was able to stand x 1 min prior to rest break. Therapist checked progress toward remaining goals in prep for renewal. Functional reaching on a low surface to grasp wooden dowel pegs to remove from pegboard then replace while wearing brace, min difficulty removing, min-mod difficulty/ drops returning pegs to pegboard, min v.c. Flipping playing cards with LUE for increased fine motor coordination. Gentle joint mobs and soft tissue mobs to left forearm, wrist and hand due to tightness. No adverse reactions.  7/22/25Paraffin to L hand and wrist x 8 mins for stiffness,no adverse reactions.  Sit-stand at sink min A-mod A, min A while standing  with walker and perfoming lateral weight shifts, min-mod v.c for posture. Picking up checkers to place in container with LUE, min difficulty/ dops, min v.c. Therapist checked progress towards box/blocks: 17 blocks with LUE, meeting goal. Functional reaching on a low surface to grasp wooden dowel pegs to remove from pegboard then replace, min difficulty removing, min-mod difficulty/ drops returning pegs to pegboard, min v.c. Discussed plans to renew next visit.  7/15/25Paraffin to L hand and wrist x 9 mins for stiffness,no dverse reactions. Functional reaching on a low surface to grasp wooden dowel pegs to remove from pegboard then replace. Min difficulty removing, mod difficulty/ drops returning pegs to pegboard, min v.c. Sit-stand at sink min A-mod A, min A while standing and perfoming functional reach with left and right UE's, weightbearing through other hand when reaching with one. Pt reported that her leg brace was rubbing her, so therapist took ankle  brace off and replaced. Pt has bandage over lateral ankle. After therapist repositioned brace pt reported improved comfort. Picking up checkers to place in container with LUe, min difficulty/ dops, min v.c. Pt forgot her thumb brace today however she was sucessful with her use of LUE.     03/04/24- Pt with reports of increased pain today.Paraffin to L hand and wrist x 8 mins for stiffness, along with hotpack to neck and shoulders, no adverse reactions, Pt reports ankle pain so she declined standing Fine motor coordination activities: flipping playing cards, picking up checkers to place in container min difficulty wearing thumb brace. Removing wooden pegs with only min difficulty wearing brace, replacing with min-mod difficulty/ drops.   02/26/24-discussed risks and benefits of a walker splint with PT and patient. Pt. performed standing with walker x  3 reps, for short duration without a walker splint for increased weightbearing. Initally 2 person assist due pt apprehension, then pt was able to perform sit to stand with min A pulling up on walker with 1 person. Paraffin to L hand and wrist x 8 mins for stiffness,no adverse reactions Pt's CMC splint was applied then pt placed and removed wooden dowel pegs, min drops, v.c and increased  time Putty exercises for sustained grip and pinch min-mod difficulty/ v.c    Education details:  see above Person educated: Patient Education method: Programmer, multimedia, Demonstration, Verbal cues,  Education comprehension: verbalized understanding, returned demonstration, verbal cues required, and tactile cues required   HOME EXERCISE PROGRAM: 01/07/24 A/ROM, light functional use of LUE, cards, checkers yellow putty  GOALS: Goals reviewed with patient? Yes  SHORT TERM GOALS: Target date: 04/25/24  I with inital HEP.  Goal status: met, pt is performing at home. 01/22/24  2.  Pt will increase A/ROM wrist extension to 40* for improved functional use. Baseline:  30* Goal status: met 50* 01/17/24  3.  Pt will increase LUE box/ blocks score to 16 blocks Revised goal: Pt will demonstrate improved LUE finctional use for ADLs as evidenced by increasing box/ blocks score to 20 blocks or greater with LUE. Baseline: 13 blocks Goal status: met 17 blocks 03/18/24, continue with upgraded goal 04/01/24  4.  Pt will increase A/ROM wrist flexion to 50* for improved functional use.  Goal status met 50* 01/17/24  5.  Pt will increase LUE grip strength to 15 lbs for increased functional use. Baseline: RUE 30 lbs, LUE 10 lbs. Goal status: met 17 lbs 01/24/24  6.  Pt will verbalize understanding of positioning to minimize UE pain. Goal status: met discussed pillow under LUE, avoiding reaching with UE's, thumb CMC splint fitted on 02/12/23,  .7. Pt will consisently demonstrate ability to stand x 2 mins with walker with min A for increased ease with ADLs.  Baseline: 03/25/24- 1 min            Goal status: ongoing, 04/01/24,  8. Pt will report LUE pain no greater than 3/10.  Goal status ongoing 04/01/24 pain 5/10.  LONG TERM GOALS: Target date:   06/25/24  I with updated HEP  Goal status: ongoing, 03/25/24  2.  Pt will increase LUE box/ blocks score to 16 blocks  Goal status: met 17 blocks 03/18/24  3.  Pt will use LUE as a gross assist at least 15% of the time for ADLS/IADLs Baseline: uses 5% Goal status: ongoing, uses 10% 03/25/24,  4.  Pt will report decreased drops with LUE. Baseline: frequent drops Goal status:  met, pt reports decreased dropping of items. 03/25/24  5. Pt will verbalize understanding of AE/DME to maximize safety/ I including possible walker splint prn.  Goal status: ongoing, pt stood today without walker splint, she does not need walker splint at this time. 02/26/24  6. Pt will increase LUE grip strength to 20 lbs for increased functional use. Upgraded goal:Pt will increase LUE grip strength to 23 lbs for increased functional use during  ADLs. Baseline: RUE 30 lbs, LUE 10 lbs. Goal status:  met 21 lbs 03/04/24, continue with upgraded goal  7. Pt will demonstrate ability to stand x 3 mins with min A with walker for increased ease with ADLs.     Goal status: new 8. Pt will consistently perfrom sit to stand with min A for increased ease with toileting  Goal status:new  ASSESSMENT:  CLINICAL IMPRESSION:Pt is progressing towards goals with improving LUE control and functional use. PERFORMANCE DEFICITS: in functional skills including ADLs, IADLs, coordination, dexterity, sensation, tone, ROM, strength, flexibility, Fine motor control, Gross motor control, mobility, balance, endurance, decreased knowledge of precautions, decreased knowledge of use of DME, and UE functional use, cognitive skills and psychosocial skills including coping strategies, environmental adaptation, habits, interpersonal interactions, and routines and behaviors.   IMPAIRMENTS: are limiting patient from ADLs, IADLs, rest and sleep, play, leisure, and social participation.   CO-MORBIDITIES: may have co-morbidities  that affects occupational performance. Patient will benefit from skilled OT to address above impairments and improve overall function.  MODIFICATION OR ASSISTANCE TO COMPLETE EVALUATION: No modification of tasks or assist necessary to complete an evaluation.  OT OCCUPATIONAL PROFILE AND HISTORY: Detailed assessment: Review of records and additional review of physical, cognitive, psychosocial history related to current functional performance.  CLINICAL DECISION MAKING: LOW - limited treatment options, no task modification necessary  REHAB POTENTIAL: Good  EVALUATION COMPLEXITY: Low    PLAN:  OT FREQUENCY: 1x week  OT DURATION: 12 weeks, may d/c after 8 weeks  PLANNED INTERVENTIONS: 97168 OT Re-evaluation, 97535 self care/ADL training, 02889 therapeutic exercise, 97530 therapeutic activity, 97112 neuromuscular re-education, 97140 manual  therapy, 97035 ultrasound, 97018 paraffin, 02989 moist heat, 97010 cryotherapy, 97034 contrast bath, 97014 electrical stimulation unattended, passive range of motion, balance training, functional mobility training, psychosocial skills training, energy conservation, coping strategies training, patient/family education, and DME and/or AE instructions  RECOMMENDED OTHER SERVICES: n/a  CONSULTED AND AGREED WITH PLAN OF CARE: Patient  PLAN FOR NEXT SESSION:   functional use of LUE, standing with walker for weightbearing through LUE for increased control,     Kuuipo Anzaldo, OT 04/01/2024, 11:13 AM

## 2024-04-15 ENCOUNTER — Ambulatory Visit: Admitting: Occupational Therapy

## 2024-04-22 ENCOUNTER — Ambulatory Visit: Admitting: Occupational Therapy

## 2024-04-22 ENCOUNTER — Encounter: Payer: Self-pay | Admitting: Occupational Therapy

## 2024-04-22 DIAGNOSIS — I69354 Hemiplegia and hemiparesis following cerebral infarction affecting left non-dominant side: Secondary | ICD-10-CM

## 2024-04-22 DIAGNOSIS — R270 Ataxia, unspecified: Secondary | ICD-10-CM | POA: Diagnosis not present

## 2024-04-22 DIAGNOSIS — M6281 Muscle weakness (generalized): Secondary | ICD-10-CM | POA: Diagnosis not present

## 2024-04-22 DIAGNOSIS — R293 Abnormal posture: Secondary | ICD-10-CM

## 2024-04-22 DIAGNOSIS — M25512 Pain in left shoulder: Secondary | ICD-10-CM

## 2024-04-22 DIAGNOSIS — R2681 Unsteadiness on feet: Secondary | ICD-10-CM | POA: Diagnosis not present

## 2024-04-22 DIAGNOSIS — R278 Other lack of coordination: Secondary | ICD-10-CM

## 2024-04-22 NOTE — Therapy (Unsigned)
 OUTPATIENT OCCUPATIONAL THERAPY NEURO Treatment  Patient Name: Karen Jackson MRN: [id number] DOB:16-Jan-1941, 83 y.o., female Today's Date: 04/23/2024  PCP: Dr. Shayne REFERRING PROVIDER: Dr. Shayne  OCCUPATIONAL THERAPY DISCHARGE SUMMARY   Current functional level related to goals / functional outcomes: Pt met 4/8 long term goals. Pt did not fullymeet all goals as several goals deferred as pt did not whish to continue addressing standing in OT as she well see PT in September.   Remaining deficits: decreased strength, decreased coordination, decreased balance, decreased functional use of LUE, pain   Education / Equipment: Pt was instructed in HEP, she demonstrates understanding.   Patient agrees to discharge. Patient goals were partially met. Patient is being discharged due to being pleased with the current functional level..     END OF SESSION:  OT End of Session - 04/22/24 1113     Visit Number 19    Number of Visits 25    Date for OT Re-Evaluation 06/17/24    Authorization Type MCR and  [employer]    Authorization Time Period 12 weeks    Authorization - Visit Number 18    Progress Note Due on Visit 20    OT Start Time 1104    OT Stop Time 1144    OT Time Calculation (min) 40 min                       Past Medical History:  Diagnosis Date   Acute cystitis without hematuria    Acute diastolic CHF (congestive heart failure) (HCC)    Arthritis    Dyspnea    Dysrhythmia    Fever of unknown origin 03/19/2017   Hyperlipidemia    Hypertension    denies at preop   Multifocal pneumonia    Neuromuscular disorder (HCC)    neuropathy left arm and foot   Osteopenia    Paralysis (HCC)    partial left side from CVA    Persistent atrial fibrillation (HCC)    PONV (postoperative nausea and vomiting)    Pre-diabetes    Stroke [facility]) 2013   hemmorahgic   Past Surgical History:  Procedure Laterality Date   ANKLE SURGERY     APPENDECTOMY     CHOLECYSTECTOMY      HARDWARE REMOVAL Left 03/29/2023   Procedure: HARDWARE REMOVAL;  Surgeon: Kit Rush, MD;  Location: [facility];  Service: Orthopedics;  Laterality: Left;   HERNIA REPAIR     Esophagus   INCISION AND DRAINAGE OF WOUND Left 03/29/2023   Procedure: LEFT ANKLE WOUND IRRIGATION AND DEBRIDEMENT WOUND;  Surgeon: Kit Rush, MD;  Location: [facility];  Service: Orthopedics;  Laterality: Left;   JOINT REPLACEMENT     total- right partial- left   MASTECTOMY PARTIAL / LUMPECTOMY  2012   left   ORIF ANKLE FRACTURE Left 07/20/2018   Procedure: OPEN REDUCTION INTERNAL FIXATION (ORIF) ANKLE FRACTURE;  Surgeon: Kit Rush, MD;  Location: [facility];  Service: Orthopedics;  Laterality: Left;   TOTAL KNEE ARTHROPLASTY Left 01/27/2019   Procedure: TOTAL KNEE ARTHROPLASTY;  Surgeon: Rubie Kemps, MD;  Location: [facility];  Service: Orthopedics;  Laterality: Left;   Patient Active Problem List   Diagnosis Date Noted   Goals of care, counseling/discussion 01/17/2022   Grief 01/17/2022   Secondary hypercoagulable state (HCC) 03/16/2021   Dizziness 03/10/2021   Orthostatic hypotension 10/15/2020   Presbycusis of both ears 03/08/2020   Mixed stress and urge urinary incontinence 12/02/2019  Macrocytosis 12/01/2019   Nutritional anemia 12/01/2019   S/P total knee replacement 01/27/2019   Recurrent left knee instability 07/05/2018   Respiratory failure with hypoxia (HCC) 08/30/2017   Hypoxemia    Heart failure with preserved ejection fraction (HCC), Grade 3 diastolic dysfunction 03/26/2017   PAF (paroxysmal atrial fibrillation) (HCC)    Dyspnea 03/19/2017   Encounter for preventive health examination 02/17/2016   Sensorineural hearing loss (SNHL), bilateral 01/26/2016   Hypomagnesemia 04/24/2014   Hemiparesis affecting left side as late effect of cerebrovascular accident (HCC) 04/24/2014   Nontraumatic cerebral hemorrhage (HCC) 04/30/2012   DM (diabetes mellitus) with complications  (HCC) 03/04/2010   OSTEOPENIA 01/21/2009   UNSPECIFIED VITAMIN D  DEFICIENCY 11/19/2007   HYPERCHOLESTEROLEMIA 10/25/2006   GASTROESOPHAGEAL REFLUX, NO ESOPHAGITIS 10/25/2006   DIVERTICULOSIS OF COLON 10/25/2006   Osteoarthritis 10/25/2006   CERVICAL SPINE DISORDER, NOS 10/25/2006    ONSET DATE: 12/03/23- referral date  REFERRING DIAG: P30.645 (ICD-10-CM) - Hemiplegia and hemiparesis following cerebral infarction affecting left non-dominant side   THERAPY DIAG:  Muscle weakness (generalized)  Hemiplegia and hemiparesis following cerebral infarction affecting left non-dominant side (HCC)  Abnormal posture  Other lack of coordination  Unsteadiness on feet  Left shoulder pain, unspecified chronicity  Rationale for Evaluation and Treatment: Rehabilitation  SUBJECTIVE:   SUBJECTIVE STATEMENT: Pt reports she is ready to wrap up today Pt accompanied by: self  PERTINENT HISTORY: CVA 2013, CHF, HTN, A-fib, TKA, s/p ORIF left ankle hardware removal, s/p injection to bilateral shoulders 01/02/24  PRECAUTIONS: Fall, no therapy to bilateral shoulders per MD  WEIGHT BEARING RESTRICTIONS: No  PAIN:  Are you having pain? Yes: NPRS scale: 2/10 thumb/ hand, shoulders 6/10,  Pain location: bilateral shoulders, left wrist Pain description: aching,  Aggravating factors: movement, Relieving factors: rest  FALLS: Has patient fallen in last 6 months? No  LIVING ENVIRONMENT: Lives with: has caregiver 8-2, 4:30-8pm Lives in: House/apartment Stairs: No, has ramp Has following equipment at home: Walker - 2 wheeled, shower chair, and Ramped entry  PLOF: Needs assistance with homemaking, Needs assistance with transfers, ADLS, plays bridge, she has  an aide at home that helps with meals, going to MD's, dressing and bathing, is alone at night    PATIENT GOALS: improve functional use of LUE  OBJECTIVE:  Note: Objective measures were completed at Evaluation unless otherwise noted.  HAND  DOMINANCE: Right  ADLs: Overall ADLs: requires assist Transfers/ambulation related to ADLs: Eating: needs assit with cutting food Grooming: set up UB Dressing: mod A/ max  LB Dressing: max  Toileting: has grab bar, min A Bathing: min-mod A Tub Shower transfers: min-mod A to rolling shower chair, then caregivers roll you    IADLs:caregiver's perform IADLS/ home management  Medication management: pt and caregiver complete together  Financial management: nephew handles  MOBILITY STATUS: Needs Assist: Pt requires min-mod A for transfers, pt does not walk with caregiver's  POSTURE COMMENTS:  rounded shoulders and forward head       UPPER EXTREMITY ROM:  RUE elbow wrist and hand grossly WFL, shoulder flexion limited to 50* due to arthritis  Active ROM Right eval Left eval  Shoulder flexion  45  Shoulder abduction    Shoulder adduction    Shoulder extension    Shoulder internal rotation    Shoulder external rotation    Elbow flexion  WFL  Elbow extension  WFL  Wrist flexion  40  Wrist extension  30  Wrist ulnar deviation    Wrist radial deviation  Wrist pronation  WFL  Wrist supination  80%  (Blank rows = not tested)   HAND FUNCTION: Grip strength: Right: 30 lbs; Left: 10 lbs  COORDINATION: Box/ blocks LUE 13 blocks in 1 min   SENSATION: Light touch: Impaired     MUSCLE TONE: LUE: Mild and Hypertonic  COGNITION: Overall cognitive status: Within functional limits for tasks assessed    VISION ASSESSMENT:NT, wear glasses for distance vision                                                                                                                                  TREATMENT DATE:04/22/24-Paraffin to L hand and wrist x 10 mins for stiffness,no adverse reactions, with hotpack applied to shoulders and neck x 6 mins, no adverse reactions Pt requests to wrap up with OT today as she begins PT in september. Reveiwed yellow putty exercises for  sustained grip and pinch, min difficulty/ v.c Picking up checkers to place in container with LUE, improved performance and functional grasp/ release today, min v.c Functional grasp  of wooden dowel pegs to remove from pegboard on low surface then replacing while wearing brace, min difficulty/ drops returning pegs to pegboard, min v.c. Therapist checked progress towards goals. See goals.  Education details:   yellow putty review, plans for d/c Person educated: Patient Education method: Explanation, Demonstration, Verbal cues,  Education comprehension: verbalized understanding, returned demonstration, verbal cues required, and tactile cues required   HOME EXERCISE PROGRAM: 01/07/24 A/ROM, light functional use of LUE, cards, checkers yellow putty  GOALS: Goals reviewed with patient? Yes  SHORT TERM GOALS: Target date: 04/25/24  I with inital HEP.  Goal status: met, pt is performing at home. 01/22/24  2.  Pt will increase A/ROM wrist extension to 40* for improved functional use. Baseline: 30* Goal status: met 50* 01/17/24  3.  Pt will increase LUE box/ blocks score to 16 blocks Revised goal: Pt will demonstrate improved LUE finctional use for ADLs as evidenced by increasing box/ blocks score to 20 blocks or greater with LUE. Baseline: 13 blocks Goal status: met 17 blocks 03/18/24,  upgraded goal partially met, 19 blocks without brace, 10 blocks with brace. 04/22/24.  4.  Pt will increase A/ROM wrist flexion to 50* for improved functional use.  Goal status met 50* 01/17/24  5.  Pt will increase LUE grip strength to 15 lbs for increased functional use. Baseline: RUE 30 lbs, LUE 10 lbs. Goal status: met 17 lbs 01/24/24  6.  Pt will verbalize understanding of positioning to minimize UE pain. Goal status: met discussed pillow under LUE, avoiding reaching with UE's, thumb CMC splint fitted on 02/12/23,  .7. Pt will consisently demonstrate ability to stand x 2 mins with walker with min A for  increased ease with ADLs.  Baseline: 03/25/24- 1 min            Goal status: deferred per pt request, 04/22/24, pt to  start PT in September 8. Pt will report LUE pain no greater than 3/10.  Goal status: partially met, improved to 3/10 for hand and wrist , greater 6/10 for shoulder 04/22/24  LONG TERM GOALS: Target date:   06/25/24  I with updated HEP  Goal status: met, pt demonstrates understanding of yellow puty. 04/22/24  2.  Pt will increase LUE box/ blocks score to 16 blocks  Goal status: met 17 blocks 03/18/24  3.  Pt will use LUE as a gross assist at least 15% of the time for ADLS/IADLs Baseline: uses 5% Goal status: partially met 10-15% reports using more. 04/22/24  4.  Pt will report decreased drops with LUE. Baseline: frequent drops Goal status:  met, pt reports decreased dropping of items. 03/25/24  5. Pt will verbalize understanding of AE/DME to maximize safety/ I including possible walker splint prn.  Goal status: deferred pt does not need walker splint/ AE at this time. 04/22/24  6. Pt will increase LUE grip strength to 20 lbs for increased functional use. Upgraded goal:Pt will increase LUE grip strength to 23 lbs for increased functional use during ADLs. Baseline: RUE 30 lbs, LUE 10 lbs. Goal status:  inital met 21 lbs 03/04/24, continue with upgraded goal, upgraded goal not met 20lbs  04/22/24  7. Pt will demonstrate ability to stand x 3 mins with min A with walker for increased ease with ADLs.     Goal status: deferred per pt request starting with PT. 8. Pt will consistently perfrom sit to stand with min A for increased ease with toileting  Goal status: deferred per pt request, starting with PT.  ASSESSMENT:  CLINICAL IMPRESSION:Pt  made good overall progress see goals. Several goals were deferred as pt plans for work on standing and mobility with PT. Pt met 4/8 long term goals. PERFORMANCE DEFICITS: in functional skills including ADLs, IADLs, coordination, dexterity,  sensation, tone, ROM, strength, flexibility, Fine motor control, Gross motor control, mobility, balance, endurance, decreased knowledge of precautions, decreased knowledge of use of DME, and UE functional use, cognitive skills and psychosocial skills including coping strategies, environmental adaptation, habits, interpersonal interactions, and routines and behaviors.   IMPAIRMENTS: are limiting patient from ADLs, IADLs, rest and sleep, play, leisure, and social participation.   CO-MORBIDITIES: may have co-morbidities  that affects occupational performance. Patient will benefit from skilled OT to address above impairments and improve overall function.  MODIFICATION OR ASSISTANCE TO COMPLETE EVALUATION: No modification of tasks or assist necessary to complete an evaluation.  OT OCCUPATIONAL PROFILE AND HISTORY: Detailed assessment: Review of records and additional review of physical, cognitive, psychosocial history related to current functional performance.  CLINICAL DECISION MAKING: LOW - limited treatment options, no task modification necessary  REHAB POTENTIAL: Good  EVALUATION COMPLEXITY: Low    PLAN:  OT FREQUENCY: 1x week  OT DURATION: 12 weeks, may d/c after 8 weeks  PLANNED INTERVENTIONS: 97168 OT Re-evaluation, 97535 self care/ADL training, 02889 therapeutic exercise, 97530 therapeutic activity, 97112 neuromuscular re-education, 97140 manual therapy, 97035 ultrasound, 97018 paraffin, 02989 moist heat, 97010 cryotherapy, 97034 contrast bath, 97014 electrical stimulation unattended, passive range of motion, balance training, functional mobility training, psychosocial skills training, energy conservation, coping strategies training, patient/family education, and DME and/or AE instructions  RECOMMENDED OTHER SERVICES: n/a  CONSULTED AND AGREED WITH PLAN OF CARE: Patient  PLAN FOR NEXT SESSION:   d/c OT   Jesslynn Kruck, OT 04/23/2024, 8:29 AM

## 2024-05-01 ENCOUNTER — Ambulatory Visit: Payer: Self-pay | Admitting: Pulmonary Disease

## 2024-05-01 ENCOUNTER — Ambulatory Visit: Admitting: Cardiology

## 2024-05-01 NOTE — Telephone Encounter (Signed)
ATC X1

## 2024-05-01 NOTE — Telephone Encounter (Signed)
 Patient is advised that with increased shortness of breath worse than normal, increased phlegm (clear and yellow), and congestion is is recommended to be seen within 4 hours.  ER precautions given as well. Patient wants to get an appointment with Pulmonary because she hasn't been in a while and would like an appointment on a Monday or Wednesday after next week. She is advised to seek care for her acute symptoms---either PCP or Urgent Care or ER (if symptoms worsen especially) and she states she will.    FYI Only or Action Required?: Action required by provider: request for appointment.  Patient is followed in Pulmonology for Shortness of breath, last seen on 01/10/2022 by Kara Dorn NOVAK, MD.  Called Nurse Triage reporting Shortness of Breath.  Symptoms began several weeks ago.  Interventions attempted: OTC medications: flonase.  Symptoms are: gradually worsening.  Triage Disposition: See HCP Within 4 Hours (Or PCP Triage)  Patient/caregiver understands and will follow disposition?: Unsure            Copied from CRM #[id number]. Topic: Clinical - Red Word Triage >> May 01, 2024 11:11 AM Celestine FALCON wrote: Red Word that prompted transfer to Nurse Triage: Pt is calling to schedule pulm visit with Dr. Kara for pulm hypertension follow up, but she mentioned having phlegm and SOB for about a month.   No appt is currently scheduled for pulm; pt sees Dr. Kara in [place]. Reason for Disposition  [1] MILD difficulty breathing (e.g., minimal/no SOB at rest, SOB with walking, pulse < 100) AND [2] NEW-onset or WORSE than normal  Answer Assessment - Initial Assessment Questions Increased shortness of breath over the past few weeks, increased phlegm---clear and some yellow, congestion Patient states that she wants to follow up with her pulmonary team about her increased phlegm & because she hasn't been in a while.  Patient going to physical therapy Monday Patient also going to see her  heart Cardiologist Patient would like an appt on a Monday or Wednesday  Patient is advised to contact her PCP to see if they have any openings or go to Urgent Care for her symptoms at this time. Patient states she sees her cardiologist next week but this RN advised her that with increased shortness of breath worse than normal it is advised to be seen and evaluated in the next 4 hours. Patient states she will get taken care of and wants us  to call her back to set up an appointment to follow up with her pulmonology team Patient is advised that if anything worsens to go to the Emergency Room. Patient verbalized understanding.    [facility] Pulmonary Triage - Initial Assessment Questions Chief Complaint (e.g., cough, sob, wheezing, fever, chills, sweat or additional symptoms) *Go to specific symptom protocol after initial questions. A lot of phlegm--clear and yellow, shortness of breath worse lately per patient  How long have symptoms been present? Worse in the past few weeks  Have you tested for COVID or Flu? Note: If not, ask patient if a home test can be taken. If so, instruct patient to call back for positive results. No  MEDICINES:   Have you used any OTC meds to help with symptoms? Yes If yes, ask What medications? Flonase  Have you used your inhalers/maintenance medication? No If yes, What medications? N/a  If inhaler, ask How many puffs and how often? Note: Review instructions on medication in the chart. N/a  OXYGEN : Do you wear supplemental oxygen ? No If yes, How many liters are  you supposed to use? N/a  Do you monitor your oxygen  levels? Yes If yes, What is your reading (oxygen  level) today? Patient states usually 91-93%  What is your usual oxygen  saturation reading?  (Note: Pulmonary O2 sats should be 90% or greater) -------    3. PATTERN Does the difficult breathing come and go, or has it been constant since it started?      Worse lately 4.  SEVERITY: How bad is your breathing? (e.g., mild, moderate, severe)      Worse lately 5. RECURRENT SYMPTOM: Have you had difficulty breathing before? If Yes, ask: When was the last time? and What happened that time?      ------ 6. CARDIAC HISTORY: Do you have any history of heart disease? (e.g., heart attack, angina, bypass surgery, angioplasty)      Has a cardiologist 7. LUNG HISTORY: Do you have any history of lung disease?  (e.g., pulmonary embolus, asthma, emphysema)     Pulmonary hypertension 8. CAUSE: What do you think is causing the breathing problem?      ------- 9. OTHER SYMPTOMS: Do you have any other symptoms? (e.g., chest pain, cough, dizziness, fever, runny nose)     Congestion  Protocols used: Breathing Difficulty-A-AH

## 2024-05-02 NOTE — Telephone Encounter (Signed)
 ATC X2. LMTCB. I will send pt a mychart message then completing note per protocol. NFN

## 2024-05-04 NOTE — Progress Notes (Unsigned)
 Cardiology Office Note:    Date:  05/06/2024   ID:  Karen Jackson, DOB Apr 07, 1941, MRN [id number]  PCP:  Shayne Anes, MD  Cardiologist:  Lonni LITTIE Nanas, MD  Electrophysiologist:  OLE ONEIDA HOLTS, MD   Referring MD: Shayne Anes, MD   No chief complaint on file.   History of Present Illness:    Karen Jackson is a 83 y.o. female with a hx of hemorrhagic CVA in 2013, persistent atrial fibrillation, chronic diastolic heart failure, hypertension, hyperlipidemia who presents for follow-up.  She was seen for an initial visit for diastolic heart failure on 07/26/2021.  She follows with Dr. HOLTS in EP for her A. fib.  Was seen on 07/05/2021 and started on Eliquis .  Noted to be warm and wet on exam, Lasix  was increased to 80 mg twice daily x3 days then resume Lasix  80 mg daily.  Echo 06/03/2021 showed EF 50 to 55%, normal RV function, mild biatrial enlargement, mild MR, mild to moderate TR. Calcium  score 2093 (97th percentile) on 05/25/2021.  Lexiscan  Myoview  on 08/09/2021 showed normal perfusion, EF 58%.  Zio patch x7 days on 08/11/2021 showed 100% A-fib burden with average rate 84 bpm, frequent PVCs (9.4% of beats).  Echocardiogram 05/09/22 showed EF 55-60%, normal RV function/mild enlargement, mildy elevated pulmonary pressures (RVSP ), moderate LAE,no significant valvular disease.  Since last clinic visit, she reports she is doing okay.  She is taking her Lasix  about 5 days/week.  Reports it is keeping the fluid off but does notice worsening shortness of breath when she does not take it.  She denies any chest pain, lightheadedness or syncope.  Denies any palpitations.  She denies any bleeding on Eliquis .  Wt Readings from Last 3 Encounters:  05/06/24 140 lb (63.5 kg)  12/28/23 160 lb (72.6 kg)  09/05/23 160 lb (72.6 kg)       Past Medical History:  Diagnosis Date   Acute cystitis without hematuria    Acute diastolic CHF (congestive heart failure) (HCC)    Arthritis     Dyspnea    Dysrhythmia    Fever of unknown origin 03/19/2017   Hyperlipidemia    Hypertension    denies at preop   Multifocal pneumonia    Neuromuscular disorder (HCC)    neuropathy left arm and foot   Osteopenia    Paralysis (HCC)    partial left side from CVA    Persistent atrial fibrillation (HCC)    PONV (postoperative nausea and vomiting)    Pre-diabetes    Stroke [facility]) 2013   hemmorahgic    Past Surgical History:  Procedure Laterality Date   ANKLE SURGERY     APPENDECTOMY     CHOLECYSTECTOMY     HARDWARE REMOVAL Left 03/29/2023   Procedure: HARDWARE REMOVAL;  Surgeon: Kit Rush, MD;  Location: [facility];  Service: Orthopedics;  Laterality: Left;   HERNIA REPAIR     Esophagus   INCISION AND DRAINAGE OF WOUND Left 03/29/2023   Procedure: LEFT ANKLE WOUND IRRIGATION AND DEBRIDEMENT WOUND;  Surgeon: Kit Rush, MD;  Location: [facility];  Service: Orthopedics;  Laterality: Left;   JOINT REPLACEMENT     total- right partial- left   MASTECTOMY PARTIAL / LUMPECTOMY  2012   left   ORIF ANKLE FRACTURE Left 07/20/2018   Procedure: OPEN REDUCTION INTERNAL FIXATION (ORIF) ANKLE FRACTURE;  Surgeon: Kit Rush, MD;  Location: [facility];  Service: Orthopedics;  Laterality: Left;   TOTAL KNEE ARTHROPLASTY  Left 01/27/2019   Procedure: TOTAL KNEE ARTHROPLASTY;  Surgeon: Rubie Kemps, MD;  Location: [facility];  Service: Orthopedics;  Laterality: Left;    Current Medications: Current Meds  Medication Sig   apixaban  (ELIQUIS ) 5 MG TABS tablet Take 1 tablet (5 mg total) by mouth 2 (two) times daily.   atorvastatin  (LIPITOR) 40 MG tablet TAKE 1 TABLET(40 MG) BY MOUTH DAILY   Calcium  Carb-Cholecalciferol (CALCIUM  CARBONATE-VITAMIN D3) 600-400 MG-UNIT TABS Take 1 tablet by mouth daily.   celecoxib  (CELEBREX ) 200 MG capsule TAKE 1 CAPSULE BY MOUTH DAILY AS NEEDED FOR ARTHRITIS OR PAIN   diclofenac  Sodium (VOLTAREN ) 1 % GEL APPLY 2 GRAMS EXTERNALLY TO THE AFFECTED  AREA FOUR TIMES DAILY   docusate sodium  (COLACE) 100 MG capsule Take 1 capsule (100 mg total) by mouth 2 (two) times daily. While taking narcotic pain medicine.   ergocalciferol  (VITAMIN D2) 1.25 MG (50000 UT) capsule 50,000 unit   furosemide  (LASIX ) 80 MG tablet Take 1 tablet (80 mg total) by mouth every other day.   gabapentin  (NEURONTIN ) 100 MG capsule TAKE 1 CAPSULE BY MOUTH 3  TIMES DAILY   hydrocortisone 2.5 % cream Apply topically 2 (two) times daily as needed.   lidocaine  (LIDODERM ) 5 % PLACE 1 PATCH ONTO THE SKIN DAILY. REMOVE AND DISCORD PATCH WITHIN 12 HOURS OR AS DIRECTED BY MD   MAGnesium -Oxide 400 (240 Mg) MG tablet Take 1 tablet (400 mg total) by mouth 2 (two) times daily.   metoprolol  succinate (TOPROL -XL) 50 MG 24 hr tablet Take 1 tablet (50 mg total) by mouth daily. Take with or immediately following a meal.   omeprazole  (PRILOSEC) 20 MG capsule TAKE 1 CAPSULE BY MOUTH  DAILY   potassium chloride  SA (KLOR-CON  M20) 20 MEQ tablet Take 1 tablet (20 mEq total) by mouth daily.   Semaglutide , 1 MG/DOSE, (OZEMPIC , 1 MG/DOSE,) 4 MG/3ML SOPN Inject 1 mg into the skin once a week.   senna (SENOKOT) 8.6 MG TABS tablet Take 2 tablets (17.2 mg total) by mouth 2 (two) times daily.   traZODone  (DESYREL ) 50 MG tablet TAKE 1 TABLET BY MOUTH AT  BEDTIME     Allergies:   Codeine phosphate   Social History   Socioeconomic History   Marital status: Widowed    Spouse name: Not on file   Number of children: 2   Years of education: college   Highest education level: Not on file  Occupational History   Occupation: RetiredFilm/video editor  Tobacco Use   Smoking status: Former    Current packs/day: 0.00    Average packs/day: 0.5 packs/day for 23.5 years (11.8 ttl pk-yrs)    Types: Cigarettes    Start date: 08/28/1957    Quit date: 03/09/1981    Years since quitting: 43.1   Smokeless tobacco: Never  Vaping Use   Vaping status: Never Used  Substance and Sexual Activity   Alcohol  use: Yes     Alcohol /week: 16.0 standard drinks of alcohol     Types: 2 Glasses of wine, 14 Standard drinks or equivalent per week    Comment: daily one glass last pm 03/28/2023   Drug use: No   Sexual activity: Not Currently  Other Topics Concern   Not on file  Social History Narrative   ** Merged History Encounter ** Health Care POA: son, Charly RayEmergency Contact: Ann and Garrel Greet, niece & nephew (c) [phone] (h) [phone] of Life Plan: Who lives with you: selfAny pets: noneDiet: Pt has a varied diet of protein,  vegetables and limits white foods.Exercise: Pt does water  aerobics 3x week, golf 2x weekSeatbelts: Pt reports wearing seatbelt when in vehicle. Sun Exposure/Protection: Pt reports wearing sun screenHobbies: golfing, swimming    Lives alone (has caregivers coming in)   R handed    Social Drivers of Corporate investment banker Strain: Not on file  Food Insecurity: Not on file  Transportation Needs: Not on file  Physical Activity: Not on file  Stress: Not on file  Social Connections: Not on file     Family History: The patient's family history includes Dementia in her father; Diabetes in her mother; Transient ischemic attack in her father.  ROS:   Please see the history of present illness.     All other systems reviewed and are negative.  EKGs/Labs/Other Studies Reviewed:    The following studies were reviewed today:   EKG:   04/27/2022: Atrial fibrillation, rate 76 01/25/2023: Sinus rhythm, first-degree AV block, PVC, left axis deviation, poor R wave progression 07/24/2023: Atrial flutter with variable conduction, low voltage 05/06/2024: Atrial fibrillation, rate 61, low voltage  Recent Labs: 08/28/2023: BUN 12; Creatinine, Ser 0.72; Magnesium  1.9; Potassium 4.0; Sodium 137  Recent Lipid Panel    Component Value Date/Time   CHOL 164 04/27/2022 1557   TRIG 239 (H) 04/27/2022 1557   HDL 65 04/27/2022 1557   CHOLHDL 2.5 04/27/2022 1557   CHOLHDL 2.5 06/17/2015 1057   VLDL  23 06/17/2015 1057   LDLCALC 61 04/27/2022 1557   LDLDIRECT 107 (H) 04/24/2014 1118    Physical Exam:    VS:  BP 106/66   Pulse 61   Ht 4' 11 (1.499 m)   Wt 140 lb (63.5 kg)   SpO2 92%   BMI 28.28 kg/m     Wt Readings from Last 3 Encounters:  05/06/24 140 lb (63.5 kg)  12/28/23 160 lb (72.6 kg)  09/05/23 160 lb (72.6 kg)     GEN:  Well nourished, well developed in no acute distress HEENT: Normal NECK: No JVD; No carotid bruits LYMPHATICS: No lymphadenopathy CARDIAC: irregular, normal rate, no murmurs, rubs, gallops RESPIRATORY:  Clear to auscultation without rales, wheezing or rhonchi  ABDOMEN: Soft, non-tender, non-distended MUSCULOSKELETAL:  No edema; No deformity  SKIN: Warm and dry NEUROLOGIC:  Alert and oriented x 3 PSYCHIATRIC:  Normal affect   ASSESSMENT:    1. Chronic diastolic heart failure (HCC)   2. Persistent atrial fibrillation (HCC)   3. Pulmonary hypertension (HCC)   4. Hyperlipidemia, unspecified hyperlipidemia type   5. CAD in native artery   6. Essential hypertension      PLAN:    Dyspnea: She reports dyspnea with exertion.  Likely multifactorial with chronic diastolic heart failure and possible restrictive lung disease contributing.  Dilated pulmonary artery on CT suggesting pulmonary hypertension.  Echocardiogram 05/09/22 showed EF 55-60%, normal RV function/mild enlargement, mildy elevated pulmonary pressures (RVSP ), moderate LAE,no significant valvular disease. -Discussed RHC/LHC for further evaluation of her symptoms and pulmonary hypertension but her preference is to hold off.  Given her age and comorbidities, I think this is reasonable.  CAD:  Calcium  score 2093 (97th percentile) on 05/25/2021.  Reported dyspnea on exertion.  Lexiscan  Myoview  on 08/09/2021 showed normal perfusion, EF 58%.   -Continue Eliquis  -Continue atorvastatin   Chronic diastolic heart failure: Echo 06/03/2021 showed EF 50 to 55%, normal RV function, mild biatrial  enlargement, mild MR, mild to moderate TR, indeterminate diastolic function. -Continue Lasix  80 mg, she takes about 5 days/week.  Appears euvolemic.  Taking calcium , magnesium , potassium supplement.  Check BMET, magnesium   Persistent atrial fibrillation: Follows with EP.  History of hemorrhagic stroke in 2013.  Has been off anticoagulation, Dr. Cindie discussed with Dr. Rosemarie in neurology and agreed to rechallenge with Eliquis .  Started on Eliquis  5 mg twice daily.  On metoprolol  25 mg twice daily for rate control. Zio patch x7 days on 08/11/2021 showed 100% A-fib burden with average rate 84 bpm, frequent PVCs (9.4% of beats) -Continue Eliquis  5 mg twice daily.  Check CBC -Continue Toprol -XL 50 mg daily -She is in atrial fibrillation with controlled rate in clinic today  Frequent PVCs: 9.4% of beats on Zio patch 08/11/2021 -Continue Toprol -XL -Follows with Dr. Cindie in EP  Hemorrhagic CVA: In 2013.  Has had no issues since starting Eliquis  in 2022  Hypertension: On Toprol -XL 50 mg daily, Lasix  80 mg daily.  Appears controlled  Hyperlipidemia: On atorvastatin  40 mg daily.  LDL 85 on 03/2023.  Check lipid panel  T2DM: on Ozempic , A1c has improved to 5.4%   RTC in 6 months    Medication Adjustments/Labs and Tests Ordered: Current medicines are reviewed at length with the patient today.  Concerns regarding medicines are outlined above.  Orders Placed This Encounter  Procedures   Basic Metabolic Panel (BMET)   Magnesium    Lipid panel   EKG 12-Lead   No orders of the defined types were placed in this encounter.    Patient Instructions  Medication Instructions:  Continue current medications *If you need a refill on your cardiac medications before your next appointment, please call your pharmacy*  Lab Work: Bmet, mg lipid panel today If you have labs (blood work) drawn today and your tests are completely normal, you will receive your results only by: MyChart Message (if you  have MyChart) OR A paper copy in the mail If you have any lab test that is abnormal or we need to change your treatment, we will call you to review the results.  Testing/Procedures: none  Follow-Up: At [facility], you and your health needs are our priority.  As part of our continuing mission to provide you with exceptional heart care, our providers are all part of one team.  This team includes your primary Cardiologist (physician) and Advanced Practice Providers or APPs (Physician Assistants and Nurse Practitioners) who all work together to provide you with the care you need, when you need it.  Your next appointment:   6 month  Provider:   Dr. Kate  We recommend signing up for the patient portal called MyChart.  Sign up information is provided on this After Visit Summary.  MyChart is used to connect with patients for Virtual Visits (Telemedicine).  Patients are able to view lab/test results, encounter notes, upcoming appointments, etc.  Non-urgent messages can be sent to your provider as well.   To learn more about what you can do with MyChart, go to [email or web address].   Other Instructions none       Signed, Lonni LITTIE Kate, MD  05/06/2024 11:31 AM    [facility]

## 2024-05-05 ENCOUNTER — Other Ambulatory Visit: Payer: Self-pay

## 2024-05-05 ENCOUNTER — Telehealth: Payer: Self-pay

## 2024-05-05 ENCOUNTER — Encounter: Payer: Self-pay | Admitting: Physical Therapy

## 2024-05-05 ENCOUNTER — Ambulatory Visit: Attending: Internal Medicine | Admitting: Physical Therapy

## 2024-05-05 DIAGNOSIS — I6990 Unspecified sequelae of unspecified cerebrovascular disease: Secondary | ICD-10-CM | POA: Diagnosis not present

## 2024-05-05 DIAGNOSIS — R262 Difficulty in walking, not elsewhere classified: Secondary | ICD-10-CM | POA: Insufficient documentation

## 2024-05-05 DIAGNOSIS — I69354 Hemiplegia and hemiparesis following cerebral infarction affecting left non-dominant side: Secondary | ICD-10-CM | POA: Diagnosis not present

## 2024-05-05 DIAGNOSIS — R270 Ataxia, unspecified: Secondary | ICD-10-CM | POA: Diagnosis not present

## 2024-05-05 DIAGNOSIS — M25572 Pain in left ankle and joints of left foot: Secondary | ICD-10-CM | POA: Diagnosis not present

## 2024-05-05 DIAGNOSIS — M6281 Muscle weakness (generalized): Secondary | ICD-10-CM | POA: Insufficient documentation

## 2024-05-05 DIAGNOSIS — M25512 Pain in left shoulder: Secondary | ICD-10-CM | POA: Insufficient documentation

## 2024-05-05 DIAGNOSIS — R293 Abnormal posture: Secondary | ICD-10-CM | POA: Insufficient documentation

## 2024-05-05 DIAGNOSIS — R2681 Unsteadiness on feet: Secondary | ICD-10-CM | POA: Diagnosis not present

## 2024-05-05 DIAGNOSIS — M542 Cervicalgia: Secondary | ICD-10-CM | POA: Diagnosis not present

## 2024-05-05 NOTE — Telephone Encounter (Addendum)
 Copied from CRM [id number]. Topic: Clinical - Red Word Triage >> May 05, 2024  9:51 AM Benton KIDD wrote: Patient returning call she received from nurse ashlyn . Giving cal a call.. no one is picking up  I can try to sche     Reach out to PT - scheduled soonerOV  w/ PA  at [facility] for SOB

## 2024-05-05 NOTE — Therapy (Signed)
 OUTPATIENT PHYSICAL THERAPY LOWER EXTREMITY EVALUATION   Patient Name: Karen Jackson MRN: [id number] DOB:1941-07-08, 83 y.o., female Today's Date: 05/05/2024  END OF SESSION:  PT End of Session - 05/05/24 1018     Visit Number 1    Date for PT Re-Evaluation 08/04/24    Authorization Type Medicare    PT Start Time 1015    PT Stop Time 1100    PT Time Calculation (min) 45 min    Activity Tolerance Patient tolerated treatment well    Behavior During Therapy WFL for tasks assessed/performed           Past Medical History:  Diagnosis Date   Acute cystitis without hematuria    Acute diastolic CHF (congestive heart failure) (HCC)    Arthritis    Dyspnea    Dysrhythmia    Fever of unknown origin 03/19/2017   Hyperlipidemia    Hypertension    denies at preop   Multifocal pneumonia    Neuromuscular disorder (HCC)    neuropathy left arm and foot   Osteopenia    Paralysis (HCC)    partial left side from CVA    Persistent atrial fibrillation (HCC)    PONV (postoperative nausea and vomiting)    Pre-diabetes    Stroke [facility]) 2013   hemmorahgic   Past Surgical History:  Procedure Laterality Date   ANKLE SURGERY     APPENDECTOMY     CHOLECYSTECTOMY     HARDWARE REMOVAL Left 03/29/2023   Procedure: HARDWARE REMOVAL;  Surgeon: Kit Rush, MD;  Location: [facility];  Service: Orthopedics;  Laterality: Left;   HERNIA REPAIR     Esophagus   INCISION AND DRAINAGE OF WOUND Left 03/29/2023   Procedure: LEFT ANKLE WOUND IRRIGATION AND DEBRIDEMENT WOUND;  Surgeon: Kit Rush, MD;  Location: [facility];  Service: Orthopedics;  Laterality: Left;   JOINT REPLACEMENT     total- right partial- left   MASTECTOMY PARTIAL / LUMPECTOMY  2012   left   ORIF ANKLE FRACTURE Left 07/20/2018   Procedure: OPEN REDUCTION INTERNAL FIXATION (ORIF) ANKLE FRACTURE;  Surgeon: Kit Rush, MD;  Location: [facility];  Service: Orthopedics;  Laterality: Left;   TOTAL KNEE  ARTHROPLASTY Left 01/27/2019   Procedure: TOTAL KNEE ARTHROPLASTY;  Surgeon: Rubie Kemps, MD;  Location: [facility];  Service: Orthopedics;  Laterality: Left;   Patient Active Problem List   Diagnosis Date Noted   Goals of care, counseling/discussion 01/17/2022   Grief 01/17/2022   Secondary hypercoagulable state (HCC) 03/16/2021   Dizziness 03/10/2021   Orthostatic hypotension 10/15/2020   Presbycusis of both ears 03/08/2020   Mixed stress and urge urinary incontinence 12/02/2019   Macrocytosis 12/01/2019   Nutritional anemia 12/01/2019   S/P total knee replacement 01/27/2019   Recurrent left knee instability 07/05/2018   Respiratory failure with hypoxia (HCC) 08/30/2017   Hypoxemia    Heart failure with preserved ejection fraction (HCC), Grade 3 diastolic dysfunction 03/26/2017   PAF (paroxysmal atrial fibrillation) (HCC)    Dyspnea 03/19/2017   Encounter for preventive health examination 02/17/2016   Sensorineural hearing loss (SNHL), bilateral 01/26/2016   Hypomagnesemia 04/24/2014   Hemiparesis affecting left side as late effect of cerebrovascular accident (HCC) 04/24/2014   Nontraumatic cerebral hemorrhage (HCC) 04/30/2012   DM (diabetes mellitus) with complications (HCC) 03/04/2010   OSTEOPENIA 01/21/2009   UNSPECIFIED VITAMIN D  DEFICIENCY 11/19/2007   HYPERCHOLESTEROLEMIA 10/25/2006   GASTROESOPHAGEAL REFLUX, NO ESOPHAGITIS 10/25/2006   DIVERTICULOSIS OF COLON 10/25/2006  Osteoarthritis 10/25/2006   CERVICAL SPINE DISORDER, NOS 10/25/2006    PCP: Shayne, MD  REFERRING PROVIDER: Shayne, MD  REFERRING DIAG: debility  THERAPY DIAG:  Muscle weakness (generalized)  Hemiplegia and hemiparesis following cerebral infarction affecting left non-dominant side (HCC)  Unsteadiness on feet  Left shoulder pain, unspecified chronicity  Ataxia  Difficulty in walking, not elsewhere classified  Pain in left ankle and joints of left foot  Cervicalgia  Late effects of CVA  (cerebrovascular accident)  Rationale for Evaluation and Treatment: Rehabilitation  ONSET DATE: 04/29/24  SUBJECTIVE:   SUBJECTIVE STATEMENT: Patient has been a patient of mine off and on for many years, she has had many different diagnoses, she had a CVA about 13 years ago, she had a left ankle fracture with ORIF, she had a lot of issues with pain and wounds, so the hardware was removed last August, she has done pretty well but tends to back slide without PT with her ability to walk and transfer.  She has a caregiver about 11 hours a day.  She has been able to walk with me in PT, no one else has walked with her   PERTINENT HISTORY: CVA, CHF, HTN, A-fib, TKA PAIN:  Are you having pain? Yes: NPRS scale: 4-5/10 Pain location: left upper trap and neck, left ankle and knee  Pain description: ache sore Aggravating factors: neck and shoulder always hurt, knee and ankle hurt with transfers Relieving factors: massage, heat  PRECAUTIONS: None  RED FLAGS: None   WEIGHT BEARING RESTRICTIONS: No  FALLS:  Has patient fallen in last 6 months? No  LIVING ENVIRONMENT: Lives with: lives alone Lives in: House/apartment Stairs: No Has following equipment at home: Environmental consultant - 2 wheeled, Wheelchair (manual), shower chair, bed side commode, Grab bars, and Ramped entry care giver 11 hours a day  OCCUPATION: retired  PLOF: Needs assistance with homemaking, Needs assistance with transfers, and Leisure: plays bridge, she has 11 hours of an aide at home that helps with meals, going to MD's, dressing and bathing, is alone at night  PATIENT GOALS: walk, have less pain, transfer without difficulty  NEXT MD VISIT: none scheduled  OBJECTIVE:   DIAGNOSTIC FINDINGS: none performed  COGNITION: Overall cognitive status: Within functional limits for tasks assessed     SENSATION: WFL POSTURE: rounded shoulders and forward head  PALPATION: Left knee and ankle are swollen and tender, left upper and  neck  LOWER EXTREMITY ROM:  Active ROM Right eval Left eval  Hip flexion    Hip extension    Hip abduction    Hip adduction    Hip internal rotation    Hip external rotation    Knee flexion  90  Knee extension  0  Ankle dorsiflexion  5  Ankle plantarflexion  30  Ankle inversion  5  Ankle eversion  0   (Blank rows = not tested)  LOWER EXTREMITY MMT:  MMT Right eval Left eval  Hip flexion 3+ 3+  Hip extension    Hip abduction 4- 3+  Hip adduction 4- 3+  Hip internal rotation    Hip external rotation    Knee flexion 4- 3+  Knee extension 4- 3+  Ankle dorsiflexion  3+  Ankle plantarflexion    Ankle inversion    Ankle eversion     (Blank rows = not tested)  FUNCTIONAL TESTS:  Transfers Max A, left knee gives out and goes into ER and tends to bow into varus, has not walked since I  discharged her 12/27/23.    GAIT: Distance walked: with HHA and w/c follow x 40 feet with pain in the left knee a 7/10, she tends to rotate the left hip posterior and really bring the left knee into extension   TODAY'S TREATMENT:                                                                                                                              DATE:  05/05/24 Nustep level 5 x 8 minutes    PATIENT EDUCATION:  Education details: POC Person educated: Patient Education method: Explanation Education comprehension: verbalized understanding  HOME EXERCISE PROGRAM: TBD  ASSESSMENT:  CLINICAL IMPRESSION: Patient is a 84 y.o. female who was seen today for physical therapy evaluation and treatment for debility,    she has not been able to walk or transfer or exercise much since D/C from PT in May.  I have seen her off and on over the past 20+ years and she is having a lot of difficulty controlling the left knee, it buckles and goes into varus and does some hyperextension, with walking 40 feet today she reported knee pain a 7/10,    OBJECTIVE IMPAIRMENTS: Abnormal gait,  cardiopulmonary status limiting activity, decreased activity tolerance, decreased balance, decreased coordination, decreased endurance, decreased mobility, difficulty walking, decreased ROM, decreased strength, increased edema, impaired flexibility, improper body mechanics, postural dysfunction, and pain.   REHAB POTENTIAL: Good  CLINICAL DECISION MAKING: Stable/uncomplicated  EVALUATION COMPLEXITY: Low   GOALS: Goals reviewed with patient? Yes  SHORT TERM GOALS: Target date: 05/28/24 Independent with advanced HEP Goal status: INITIAL  LONG TERM GOALS: Target date: 08/04/24  Independent with advanced HEP with caregiver Goal status: INITIAL  2.  Transfer with set up and CGA Goal status: INITIAL  3.  Walk HHA x 150 feet Goal status: INITIAL  4.  Increase left LE strength to 4/5 Goal status: INITIAL  5.  Report neck and shoulder pain decreased 50% Goal status: INITIAL  PLAN:  PT FREQUENCY: 2x/week  PT DURATION: 12 weeks  PLANNED INTERVENTIONS: Therapeutic exercises, Therapeutic activity, Neuromuscular re-education, Balance training, Gait training, Patient/Family education, Self Care, Joint mobilization, Moist heat, Taping, and Manual therapy  PLAN FOR NEXT SESSION: advance activities really work on stability of the left knee   Demontre Padin W, PT 05/05/2024, 10:18 AM

## 2024-05-06 ENCOUNTER — Encounter: Payer: Self-pay | Admitting: Cardiology

## 2024-05-06 ENCOUNTER — Ambulatory Visit: Attending: Cardiology | Admitting: Cardiology

## 2024-05-06 VITALS — BP 106/66 | HR 61 | Ht 59.0 in | Wt 140.0 lb

## 2024-05-06 DIAGNOSIS — I5032 Chronic diastolic (congestive) heart failure: Secondary | ICD-10-CM | POA: Diagnosis not present

## 2024-05-06 DIAGNOSIS — I10 Essential (primary) hypertension: Secondary | ICD-10-CM | POA: Diagnosis not present

## 2024-05-06 DIAGNOSIS — I2510 Atherosclerotic heart disease of native coronary artery without angina pectoris: Secondary | ICD-10-CM | POA: Diagnosis not present

## 2024-05-06 DIAGNOSIS — I4819 Other persistent atrial fibrillation: Secondary | ICD-10-CM | POA: Insufficient documentation

## 2024-05-06 DIAGNOSIS — I272 Pulmonary hypertension, unspecified: Secondary | ICD-10-CM | POA: Diagnosis not present

## 2024-05-06 DIAGNOSIS — E785 Hyperlipidemia, unspecified: Secondary | ICD-10-CM | POA: Diagnosis not present

## 2024-05-06 NOTE — Patient Instructions (Signed)
 Medication Instructions:  Continue current medications *If you need a refill on your cardiac medications before your next appointment, please call your pharmacy*  Lab Work: Bmet, mg lipid panel today If you have labs (blood work) drawn today and your tests are completely normal, you will receive your results only by: MyChart Message (if you have MyChart) OR A paper copy in the mail If you have any lab test that is abnormal or we need to change your treatment, we will call you to review the results.  Testing/Procedures: none  Follow-Up: At [facility], you and your health needs are our priority.  As part of our continuing mission to provide you with exceptional heart care, our providers are all part of one team.  This team includes your primary Cardiologist (physician) and Advanced Practice Providers or APPs (Physician Assistants and Nurse Practitioners) who all work together to provide you with the care you need, when you need it.  Your next appointment:   6 month  Provider:   Dr. Kate  We recommend signing up for the patient portal called MyChart.  Sign up information is provided on this After Visit Summary.  MyChart is used to connect with patients for Virtual Visits (Telemedicine).  Patients are able to view lab/test results, encounter notes, upcoming appointments, etc.  Non-urgent messages can be sent to your provider as well.   To learn more about what you can do with MyChart, go to [email or web address].   Other Instructions none

## 2024-05-07 ENCOUNTER — Ambulatory Visit: Payer: Self-pay | Admitting: Cardiology

## 2024-05-07 LAB — BASIC METABOLIC PANEL WITH GFR
BUN/Creatinine Ratio: 21 (ref 12–28)
BUN: 13 mg/dL (ref 8–27)
CO2: 26 mmol/L (ref 20–29)
Calcium: 9.6 mg/dL (ref 8.7–10.3)
Chloride: 92 mmol/L — ABNORMAL LOW (ref 96–106)
Creatinine, Ser: 0.63 mg/dL (ref 0.57–1.00)
Glucose: 101 mg/dL — ABNORMAL HIGH (ref 70–99)
Potassium: 3.9 mmol/L (ref 3.5–5.2)
Sodium: 137 mmol/L (ref 134–144)
eGFR: 88 mL/min/1.73 (ref >59)

## 2024-05-07 LAB — LIPID PANEL
Chol/HDL Ratio: 2.4 ratio (ref 0.0–4.4)
Cholesterol, Total: 170 mg/dL (ref 100–199)
HDL: 72 mg/dL (ref >39)
LDL Chol Calc (NIH): 76 mg/dL (ref 0–99)
Triglycerides: 129 mg/dL (ref 0–149)
VLDL Cholesterol Cal: 22 mg/dL (ref 5–40)

## 2024-05-07 LAB — MAGNESIUM: Magnesium: 2 mg/dL (ref 1.6–2.3)

## 2024-05-08 ENCOUNTER — Encounter: Payer: Self-pay | Admitting: Physical Therapy

## 2024-05-08 ENCOUNTER — Ambulatory Visit: Admitting: Physical Therapy

## 2024-05-08 DIAGNOSIS — R270 Ataxia, unspecified: Secondary | ICD-10-CM

## 2024-05-08 DIAGNOSIS — M25572 Pain in left ankle and joints of left foot: Secondary | ICD-10-CM

## 2024-05-08 DIAGNOSIS — R2681 Unsteadiness on feet: Secondary | ICD-10-CM

## 2024-05-08 DIAGNOSIS — M542 Cervicalgia: Secondary | ICD-10-CM

## 2024-05-08 DIAGNOSIS — I6990 Unspecified sequelae of unspecified cerebrovascular disease: Secondary | ICD-10-CM

## 2024-05-08 DIAGNOSIS — M25512 Pain in left shoulder: Secondary | ICD-10-CM

## 2024-05-08 DIAGNOSIS — R262 Difficulty in walking, not elsewhere classified: Secondary | ICD-10-CM | POA: Diagnosis not present

## 2024-05-08 DIAGNOSIS — M6281 Muscle weakness (generalized): Secondary | ICD-10-CM | POA: Diagnosis not present

## 2024-05-08 DIAGNOSIS — I69354 Hemiplegia and hemiparesis following cerebral infarction affecting left non-dominant side: Secondary | ICD-10-CM

## 2024-05-08 NOTE — Therapy (Signed)
 OUTPATIENT PHYSICAL THERAPY LOWER EXTREMITY TREATMENT   Patient Name: Karen Jackson MRN: [id number] DOB:July 07, 1941, 83 y.o., female Today's Date: 05/08/2024  END OF SESSION:  PT End of Session - 05/08/24 1105     Visit Number 2    Date for PT Re-Evaluation 08/04/24    Authorization Type Medicare    PT Start Time 1055    PT Stop Time 1140    PT Time Calculation (min) 45 min    Activity Tolerance Patient tolerated treatment well    Behavior During Therapy WFL for tasks assessed/performed           Past Medical History:  Diagnosis Date   Acute cystitis without hematuria    Acute diastolic CHF (congestive heart failure) (HCC)    Arthritis    Dyspnea    Dysrhythmia    Fever of unknown origin 03/19/2017   Hyperlipidemia    Hypertension    denies at preop   Multifocal pneumonia    Neuromuscular disorder (HCC)    neuropathy left arm and foot   Osteopenia    Paralysis (HCC)    partial left side from CVA    Persistent atrial fibrillation (HCC)    PONV (postoperative nausea and vomiting)    Pre-diabetes    Stroke [facility]) 2013   hemmorahgic   Past Surgical History:  Procedure Laterality Date   ANKLE SURGERY     APPENDECTOMY     CHOLECYSTECTOMY     HARDWARE REMOVAL Left 03/29/2023   Procedure: HARDWARE REMOVAL;  Surgeon: Kit Rush, MD;  Location: [facility];  Service: Orthopedics;  Laterality: Left;   HERNIA REPAIR     Esophagus   INCISION AND DRAINAGE OF WOUND Left 03/29/2023   Procedure: LEFT ANKLE WOUND IRRIGATION AND DEBRIDEMENT WOUND;  Surgeon: Kit Rush, MD;  Location: [facility];  Service: Orthopedics;  Laterality: Left;   JOINT REPLACEMENT     total- right partial- left   MASTECTOMY PARTIAL / LUMPECTOMY  2012   left   ORIF ANKLE FRACTURE Left 07/20/2018   Procedure: OPEN REDUCTION INTERNAL FIXATION (ORIF) ANKLE FRACTURE;  Surgeon: Kit Rush, MD;  Location: [facility];  Service: Orthopedics;  Laterality: Left;   TOTAL KNEE  ARTHROPLASTY Left 01/27/2019   Procedure: TOTAL KNEE ARTHROPLASTY;  Surgeon: Rubie Kemps, MD;  Location: [facility];  Service: Orthopedics;  Laterality: Left;   Patient Active Problem List   Diagnosis Date Noted   Goals of care, counseling/discussion 01/17/2022   Grief 01/17/2022   Secondary hypercoagulable state (HCC) 03/16/2021   Dizziness 03/10/2021   Orthostatic hypotension 10/15/2020   Presbycusis of both ears 03/08/2020   Mixed stress and urge urinary incontinence 12/02/2019   Macrocytosis 12/01/2019   Nutritional anemia 12/01/2019   S/P total knee replacement 01/27/2019   Recurrent left knee instability 07/05/2018   Respiratory failure with hypoxia (HCC) 08/30/2017   Hypoxemia    Heart failure with preserved ejection fraction (HCC), Grade 3 diastolic dysfunction 03/26/2017   PAF (paroxysmal atrial fibrillation) (HCC)    Dyspnea 03/19/2017   Encounter for preventive health examination 02/17/2016   Sensorineural hearing loss (SNHL), bilateral 01/26/2016   Hypomagnesemia 04/24/2014   Hemiparesis affecting left side as late effect of cerebrovascular accident (HCC) 04/24/2014   Nontraumatic cerebral hemorrhage (HCC) 04/30/2012   DM (diabetes mellitus) with complications (HCC) 03/04/2010   OSTEOPENIA 01/21/2009   UNSPECIFIED VITAMIN D  DEFICIENCY 11/19/2007   HYPERCHOLESTEROLEMIA 10/25/2006   GASTROESOPHAGEAL REFLUX, NO ESOPHAGITIS 10/25/2006   DIVERTICULOSIS OF COLON 10/25/2006  Osteoarthritis 10/25/2006   CERVICAL SPINE DISORDER, NOS 10/25/2006    PCP: Shayne, MD  REFERRING PROVIDER: Shayne, MD  REFERRING DIAG: debility  THERAPY DIAG:  Muscle weakness (generalized)  Hemiplegia and hemiparesis following cerebral infarction affecting left non-dominant side (HCC)  Unsteadiness on feet  Left shoulder pain, unspecified chronicity  Ataxia  Difficulty in walking, not elsewhere classified  Pain in left ankle and joints of left foot  Cervicalgia  Late effects of CVA  (cerebrovascular accident)  Rationale for Evaluation and Treatment: Rehabilitation  ONSET DATE: 04/29/24  SUBJECTIVE:   SUBJECTIVE STATEMENT: I felt pretty good after the evaluation  Patient has been a patient of mine off and on for many years, she has had many different diagnoses, she had a CVA about 13 years ago, she had a left ankle fracture with ORIF, she had a lot of issues with pain and wounds, so the hardware was removed last August, she has done pretty well but tends to back slide without PT with her ability to walk and transfer.  She has a caregiver about 11 hours a day.  She has been able to walk with me in PT, no one else has walked with her   PERTINENT HISTORY: CVA, CHF, HTN, A-fib, TKA PAIN:  Are you having pain? Yes: NPRS scale: 4-5/10 Pain location: left upper trap and neck, left ankle and knee  Pain description: ache sore Aggravating factors: neck and shoulder always hurt, knee and ankle hurt with transfers Relieving factors: massage, heat  PRECAUTIONS: None  RED FLAGS: None   WEIGHT BEARING RESTRICTIONS: No  FALLS:  Has patient fallen in last 6 months? No  LIVING ENVIRONMENT: Lives with: lives alone Lives in: House/apartment Stairs: No Has following equipment at home: Environmental consultant - 2 wheeled, Wheelchair (manual), shower chair, bed side commode, Grab bars, and Ramped entry care giver 11 hours a day  OCCUPATION: retired  PLOF: Needs assistance with homemaking, Needs assistance with transfers, and Leisure: plays bridge, she has 11 hours of an aide at home that helps with meals, going to MD's, dressing and bathing, is alone at night  PATIENT GOALS: walk, have less pain, transfer without difficulty  NEXT MD VISIT: none scheduled  OBJECTIVE:   DIAGNOSTIC FINDINGS: none performed  COGNITION: Overall cognitive status: Within functional limits for tasks assessed     SENSATION: WFL POSTURE: rounded shoulders and forward head  PALPATION: Left knee and ankle  are swollen and tender, left upper and neck  LOWER EXTREMITY ROM:  Active ROM Right eval Left eval  Hip flexion    Hip extension    Hip abduction    Hip adduction    Hip internal rotation    Hip external rotation    Knee flexion  90  Knee extension  0  Ankle dorsiflexion  5  Ankle plantarflexion  30  Ankle inversion  5  Ankle eversion  0   (Blank rows = not tested)  LOWER EXTREMITY MMT:  MMT Right eval Left eval  Hip flexion 3+ 3+  Hip extension    Hip abduction 4- 3+  Hip adduction 4- 3+  Hip internal rotation    Hip external rotation    Knee flexion 4- 3+  Knee extension 4- 3+  Ankle dorsiflexion  3+  Ankle plantarflexion    Ankle inversion    Ankle eversion     (Blank rows = not tested)  FUNCTIONAL TESTS:  Transfers Max A, left knee gives out and goes into ER and tends to  bow into varus, has not walked since I discharged her 12/27/23.    GAIT: Distance walked: with HHA and w/c follow x 40 feet with pain in the left knee a 7/10, she tends to rotate the left hip posterior and really bring the left knee into extension   TODAY'S TREATMENT:                                                                                                                              DATE:  05/08/24 Nustep level 5 x 8 minutes STM to the left upper trap Passive left ankle stretch Red tband left ankle PF/DF Red tband bilateral HS curls 2.5# marches 2.5# LAQ Standing weight shifts Gait with HHA and w/c follow x75 feet   05/05/24 Nustep level 5 x 8 minutes    PATIENT EDUCATION:  Education details: POC Person educated: Patient Education method: Explanation Education comprehension: verbalized understanding  HOME EXERCISE PROGRAM: TBD  ASSESSMENT:  CLINICAL IMPRESSION: Started exercises, she had some pain in the left knee with walking, tends to have a little bit of left ankle roll laterally, she does have a brace on.  Has the tension in the left upper trap from synergistic  pattern of the spasticity  Patient is a 83 y.o. female who was seen today for physical therapy evaluation and treatment for debility,    she has not been able to walk or transfer or exercise much since D/C from PT in May.  I have seen her off and on over the past 20+ years and she is having a lot of difficulty controlling the left knee, it buckles and goes into varus and does some hyperextension, with walking 40 feet today she reported knee pain a 7/10,    OBJECTIVE IMPAIRMENTS: Abnormal gait, cardiopulmonary status limiting activity, decreased activity tolerance, decreased balance, decreased coordination, decreased endurance, decreased mobility, difficulty walking, decreased ROM, decreased strength, increased edema, impaired flexibility, improper body mechanics, postural dysfunction, and pain.   REHAB POTENTIAL: Good  CLINICAL DECISION MAKING: Stable/uncomplicated  EVALUATION COMPLEXITY: Low   GOALS: Goals reviewed with patient? Yes  SHORT TERM GOALS: Target date: 05/28/24 Independent with advanced HEP Goal status: INITIAL  LONG TERM GOALS: Target date: 08/04/24  Independent with advanced HEP with caregiver Goal status: INITIAL  2.  Transfer with set up and CGA Goal status: INITIAL  3.  Walk HHA x 150 feet Goal status: INITIAL  4.  Increase left LE strength to 4/5 Goal status: INITIAL  5.  Report neck and shoulder pain decreased 50% Goal status: INITIAL  PLAN:  PT FREQUENCY: 2x/week  PT DURATION: 12 weeks  PLANNED INTERVENTIONS: Therapeutic exercises, Therapeutic activity, Neuromuscular re-education, Balance training, Gait training, Patient/Family education, Self Care, Joint mobilization, Moist heat, Taping, and Manual therapy  PLAN FOR NEXT SESSION: advance activities really work on stability of the left knee   Alisse Tuite W, PT 05/08/2024, 11:05 AM

## 2024-05-12 ENCOUNTER — Encounter (HOSPITAL_BASED_OUTPATIENT_CLINIC_OR_DEPARTMENT_OTHER): Payer: Self-pay

## 2024-05-12 ENCOUNTER — Ambulatory Visit (HOSPITAL_BASED_OUTPATIENT_CLINIC_OR_DEPARTMENT_OTHER)

## 2024-05-12 VITALS — BP 137/49 | HR 66

## 2024-05-12 DIAGNOSIS — R053 Chronic cough: Secondary | ICD-10-CM | POA: Diagnosis not present

## 2024-05-12 DIAGNOSIS — R0602 Shortness of breath: Secondary | ICD-10-CM | POA: Diagnosis not present

## 2024-05-12 MED ORDER — INCRUSE ELLIPTA 62.5 MCG/ACT IN AEPB: 1.0000 | INHALATION_SPRAY | Freq: Every day | RESPIRATORY_TRACT | 5 refills | Status: DC

## 2024-05-12 NOTE — Progress Notes (Signed)
 @Patient  ID: Slater Karen Jackson, female    DOB: [birth year], 83 y.o.   MRN: [id number]  Chief Complaint  Patient presents with   Shortness of Breath    Referring provider: Shayne Anes, MD  HPI: Charolette Bultman is an 83 y/o female with PMH of PAF, HF, GERD, DM, and respiratory failure with hypoxia who presents for complaint of cough.  She was last seen in office in May 2023.  At that time it was recommended that she proceed with right sided heart cath to rule out pulmonary hypertension as a contributor to her shortness of breath.  Patient reports that she did undergo an echocardiogram but ultimately decided against a cardiac catheterization as part of the workup.  She did not want to undergo another procedure.  Today she reports that she has had a productive cough for at least the last couple of years.  She states that it is productive of white to clear sputum.  She notices it mostly in the morning and improves throughout the day.  She takes Mucinex once in the morning for this.  She denies any seasonal allergies or postnasal drip.  She does report some shortness of breath but does not feel that this is worse than her baseline.  She is currently working with physical therapy to improve her stamina.  At her last visit she was prescribed a trial of Symbicort .  She states that she did not feel benefit from this and therefore stopped taking it.  TEST/EVENTS : PFT completed 12/13/2021 with restrictive pattern suggested by FEV1, FVC, and FEV1/FVC ratios.  No lung volumes completed.  No response to bronchodilator.  Echocardiogram completed 05/09/2022: Right ventricular size was mildly enlarged with estimated RVSP PA at 44.2 mmHg left atrial size was moderately dilated as well no aortic stenosis  Allergies  Allergen Reactions   Codeine Phosphate Other (See Comments)    Immunization History  Administered Date(s) Administered   Fluad Quad(high Dose 65+) 06/17/2020, 05/05/2021   INFLUENZA, HIGH DOSE SEASONAL PF  04/29/2014   Influenza Split 06/20/2012   Influenza Whole 06/07/2007, 05/28/2008, 06/03/2010   Influenza,inj,Quad PF,6+ Mos 05/16/2013, 04/29/2014, 06/17/2015, 05/10/2017, 05/23/2018, 05/15/2019   PFIZER(Purple Top)SARS-COV-2 Vaccination 09/19/2019, 10/10/2019   Pneumococcal Conjugate-13 05/16/2013   Pneumococcal Polysaccharide-23 06/28/2002, 07/12/2007   Td 06/29/1999, 02/24/2009   Zoster, Live 09/28/2005    Past Medical History:  Diagnosis Date   Acute cystitis without hematuria    Acute diastolic CHF (congestive heart failure) (HCC)    Arthritis    Dyspnea    Dysrhythmia    Fever of unknown origin 03/19/2017   Hyperlipidemia    Hypertension    denies at preop   Multifocal pneumonia    Neuromuscular disorder (HCC)    neuropathy left arm and foot   Osteopenia    Paralysis (HCC)    partial left side from CVA    Persistent atrial fibrillation (HCC)    PONV (postoperative nausea and vomiting)    Pre-diabetes    Stroke [facility]) 2013   hemmorahgic    Tobacco History: Social History   Tobacco Use  Smoking Status Former   Current packs/day: 0.00   Average packs/day: 0.5 packs/day for 23.5 years (11.8 ttl pk-yrs)   Types: Cigarettes   Start date: 08/28/1957   Quit date: 03/09/1981   Years since quitting: 43.2  Smokeless Tobacco Never   Counseling given: Not Answered   Outpatient Medications Prior to Visit  Medication Sig Dispense Refill   apixaban  (ELIQUIS ) 5 MG TABS  tablet Take 1 tablet (5 mg total) by mouth 2 (two) times daily. 60 tablet 11   atorvastatin  (LIPITOR) 40 MG tablet TAKE 1 TABLET(40 MG) BY MOUTH DAILY 90 tablet 3   Calcium  Carb-Cholecalciferol (CALCIUM  CARBONATE-VITAMIN D3) 600-400 MG-UNIT TABS Take 1 tablet by mouth daily.     celecoxib  (CELEBREX ) 200 MG capsule TAKE 1 CAPSULE BY MOUTH DAILY AS NEEDED FOR ARTHRITIS OR PAIN 90 capsule 3   diclofenac  Sodium (VOLTAREN ) 1 % GEL APPLY 2 GRAMS EXTERNALLY TO THE AFFECTED AREA FOUR TIMES DAILY     docusate sodium   (COLACE) 100 MG capsule Take 1 capsule (100 mg total) by mouth 2 (two) times daily. While taking narcotic pain medicine. 30 capsule 0   ergocalciferol  (VITAMIN D2) 1.25 MG (50000 UT) capsule 50,000 unit     furosemide  (LASIX ) 80 MG tablet Take 1 tablet (80 mg total) by mouth every other day. 90 tablet 3   gabapentin  (NEURONTIN ) 100 MG capsule TAKE 1 CAPSULE BY MOUTH 3  TIMES DAILY 270 capsule 3   hydrocortisone 2.5 % cream Apply topically 2 (two) times daily as needed.     lidocaine  (LIDODERM ) 5 % PLACE 1 PATCH ONTO THE SKIN DAILY. REMOVE AND DISCORD PATCH WITHIN 12 HOURS OR AS DIRECTED BY MD 30 patch 12   MAGnesium -Oxide 400 (240 Mg) MG tablet Take 1 tablet (400 mg total) by mouth 2 (two) times daily. 90 tablet 3   metoprolol  succinate (TOPROL -XL) 50 MG 24 hr tablet Take 1 tablet (50 mg total) by mouth daily. Take with or immediately following a meal. 90 tablet 3   omeprazole  (PRILOSEC) 20 MG capsule TAKE 1 CAPSULE BY MOUTH  DAILY 90 capsule 3   potassium chloride  SA (KLOR-CON  M20) 20 MEQ tablet Take 1 tablet (20 mEq total) by mouth daily. 180 tablet 3   Semaglutide , 1 MG/DOSE, (OZEMPIC , 1 MG/DOSE,) 4 MG/3ML SOPN Inject 1 mg into the skin once a week.     senna (SENOKOT) 8.6 MG TABS tablet Take 2 tablets (17.2 mg total) by mouth 2 (two) times daily. 30 tablet 0   traZODone  (DESYREL ) 50 MG tablet TAKE 1 TABLET BY MOUTH AT  BEDTIME 90 tablet 3   No facility-administered medications prior to visit.     Review of Systems: As per HPI  Constitutional:   No  weight loss, night sweats,  Fevers, chills, fatigue, or  lassitude.  HEENT:   No headaches,  Difficulty swallowing,  Tooth/dental problems, or  Sore throat,                No sneezing, itching, ear ache, nasal congestion, post nasal drip,   CV:  No chest pain,  Orthopnea, PND, swelling in lower extremities, anasarca, dizziness, palpitations, syncope.   GI  No heartburn, indigestion, abdominal pain, nausea, vomiting, diarrhea, change in bowel  habits, loss of appetite, bloody stools.   Resp: No shortness of breath with exertion or at rest.  No excess mucus, no productive cough,  No non-productive cough,  No coughing up of blood.  No change in color of mucus.  No wheezing.  No chest wall deformity  Skin: no rash or lesions.  GU: no dysuria, change in color of urine, no urgency or frequency.  No flank pain, no hematuria   MS:  No joint pain or swelling.  No decreased range of motion.  No back pain.    Physical Exam  BP (!) 137/49   Pulse 66   SpO2 96%   GEN: A/Ox3;  pleasant , NAD, well nourished seated in a wheelchair.   HEENT:  /AT,  EACs-clear, TMs-wnl, NOSE-clear, THROAT-clear, no lesions, no postnasal drip or exudate noted.   NECK:  Supple w/ fair ROM; no JVD; normal carotid impulses w/o bruits; no thyromegaly or nodules palpated; no lymphadenopathy.    RESP  Clear  P & A; w/o, wheezes/ rales/ or rhonchi. no accessory muscle use, no dullness to percussion  CARD:  RRR, no m/r/g, trace peripheral edema, pulses intact, no cyanosis or clubbing.  GI:   Soft & nt; nml bowel sounds; no organomegaly or masses detected.   Musco: Warm bil, no deformities or joint swelling noted.   Neuro: alert, no focal deficits noted.    Skin: Warm, no lesions or rashes    Lab Results:  CBC    Component Value Date/Time   WBC 7.3 01/25/2023 1550   WBC 6.5 03/10/2021 1244   RBC 4.34 01/25/2023 1550   RBC 4.48 03/10/2021 1244   HGB 14.9 01/25/2023 1550   HCT 42.9 01/25/2023 1550   PLT 295 01/25/2023 1550   MCV 99 (H) 01/25/2023 1550   MCH 34.3 (H) 01/25/2023 1550   MCH 32.1 03/10/2021 1244   MCHC 34.7 01/25/2023 1550   MCHC 33.7 03/10/2021 1244   RDW 12.2 01/25/2023 1550   LYMPHSABS 1.2 03/10/2021 1244   MONOABS 0.6 03/10/2021 1244   EOSABS 0.1 03/10/2021 1244   BASOSABS 0.0 03/10/2021 1244    BMET    Component Value Date/Time   NA 137 05/06/2024 1149   K 3.9 05/06/2024 1149   CL 92 (L) 05/06/2024 1149   CO2 26  05/06/2024 1149   GLUCOSE 101 (H) 05/06/2024 1149   GLUCOSE 136 (H) 03/10/2021 1244   BUN 13 05/06/2024 1149   CREATININE 0.63 05/06/2024 1149   CREATININE 0.64 03/14/2016 1020   CALCIUM  9.6 05/06/2024 1149   CALCIUM  10.7 (H) 03/04/2010 0000   GFRNONAA >60 03/10/2021 1244   GFRNONAA 88 03/14/2016 1020   GFRAA >60 01/28/2019 0435   GFRAA >89 03/14/2016 1020    BNP    Component Value Date/Time   BNP 118.8 (H) 07/26/2021 1648   BNP 144.7 (H) 03/10/2021 1244    ProBNP No results found for: PROBNP  Imaging: No results found.  Administration History     None          Latest Ref Rng & Units 12/13/2021    4:06 PM  PFT Results  FVC-Pre L 1.07   FVC-Predicted Pre % 57   FVC-Post L 1.05   FVC-Predicted Post % 56   Pre FEV1/FVC % % 80   Post FEV1/FCV % % 83   FEV1-Pre L 0.86   FEV1-Predicted Pre % 62   FEV1-Post L 0.87     No results found for: NITRICOXIDE   Assessment & Plan:  Lexi Conaty is an 83 y/o female with PMH of PAF, HF, GERD, DM, and respiratory failure with hypoxia who presents for complaint of cough.  She also c/o shortness of breath, both of which are chronic and unchanged.  Her shortness of breath is multifactorial; mostly due to cardiac issues though she has refused further workup of pulmonary hypertension.  She did not perceive benefit from Symbicort ; PFTs did not show benefit with bronchodilator. Assessment & Plan Chronic cough -  Increase Mucinex to BID dosing -  Trial of LAMA -  consider follow up PFTs with lung volumes -  Consider follow up Chest CT to assess for bronchiectasis -  Patient would like to trial these medications prior to proceeding with imaging or testing -  Plan for follow up in 2 months   Return in about 2 months (around 07/12/2024).  Candis Dandy, PA-C 05/12/2024

## 2024-05-12 NOTE — Patient Instructions (Addendum)
 Start Incruse once daily.  Take Mucinex twice daily.  Continue physical therapy and ambulation encouraged.  Return to clinic sooner if new or worsening symptoms.  Plan for follow up in 2 months,.

## 2024-05-13 ENCOUNTER — Ambulatory Visit: Admitting: Physical Therapy

## 2024-05-13 ENCOUNTER — Encounter: Payer: Self-pay | Admitting: Physical Therapy

## 2024-05-13 DIAGNOSIS — M6281 Muscle weakness (generalized): Secondary | ICD-10-CM | POA: Diagnosis not present

## 2024-05-13 DIAGNOSIS — I69354 Hemiplegia and hemiparesis following cerebral infarction affecting left non-dominant side: Secondary | ICD-10-CM | POA: Diagnosis not present

## 2024-05-13 DIAGNOSIS — M25512 Pain in left shoulder: Secondary | ICD-10-CM | POA: Diagnosis not present

## 2024-05-13 DIAGNOSIS — R270 Ataxia, unspecified: Secondary | ICD-10-CM | POA: Diagnosis not present

## 2024-05-13 DIAGNOSIS — R262 Difficulty in walking, not elsewhere classified: Secondary | ICD-10-CM

## 2024-05-13 DIAGNOSIS — M25572 Pain in left ankle and joints of left foot: Secondary | ICD-10-CM

## 2024-05-13 DIAGNOSIS — M542 Cervicalgia: Secondary | ICD-10-CM

## 2024-05-13 DIAGNOSIS — R2681 Unsteadiness on feet: Secondary | ICD-10-CM

## 2024-05-13 DIAGNOSIS — I6990 Unspecified sequelae of unspecified cerebrovascular disease: Secondary | ICD-10-CM

## 2024-05-13 NOTE — Therapy (Signed)
 OUTPATIENT PHYSICAL THERAPY LOWER EXTREMITY TREATMENT   Patient Name: Karen Jackson MRN: [id number] DOB:04-01-1941, 83 y.o., female Today's Date: 05/13/2024  END OF SESSION:  PT End of Session - 05/13/24 1105     Visit Number 3    Date for PT Re-Evaluation 08/04/24    Authorization Type Medicare    PT Start Time 1058    PT Stop Time 1145    PT Time Calculation (min) 47 min    Activity Tolerance Patient tolerated treatment well    Behavior During Therapy WFL for tasks assessed/performed           Past Medical History:  Diagnosis Date   Acute cystitis without hematuria    Acute diastolic CHF (congestive heart failure) (HCC)    Arthritis    Dyspnea    Dysrhythmia    Fever of unknown origin 03/19/2017   Hyperlipidemia    Hypertension    denies at preop   Multifocal pneumonia    Neuromuscular disorder (HCC)    neuropathy left arm and foot   Osteopenia    Paralysis (HCC)    partial left side from CVA    Persistent atrial fibrillation (HCC)    PONV (postoperative nausea and vomiting)    Pre-diabetes    Stroke [facility]) 2013   hemmorahgic   Past Surgical History:  Procedure Laterality Date   ANKLE SURGERY     APPENDECTOMY     CHOLECYSTECTOMY     HARDWARE REMOVAL Left 03/29/2023   Procedure: HARDWARE REMOVAL;  Surgeon: Kit Rush, MD;  Location: [facility];  Service: Orthopedics;  Laterality: Left;   HERNIA REPAIR     Esophagus   INCISION AND DRAINAGE OF WOUND Left 03/29/2023   Procedure: LEFT ANKLE WOUND IRRIGATION AND DEBRIDEMENT WOUND;  Surgeon: Kit Rush, MD;  Location: [facility];  Service: Orthopedics;  Laterality: Left;   JOINT REPLACEMENT     total- right partial- left   MASTECTOMY PARTIAL / LUMPECTOMY  2012   left   ORIF ANKLE FRACTURE Left 07/20/2018   Procedure: OPEN REDUCTION INTERNAL FIXATION (ORIF) ANKLE FRACTURE;  Surgeon: Kit Rush, MD;  Location: [facility];  Service: Orthopedics;  Laterality: Left;   TOTAL KNEE  ARTHROPLASTY Left 01/27/2019   Procedure: TOTAL KNEE ARTHROPLASTY;  Surgeon: Rubie Kemps, MD;  Location: [facility];  Service: Orthopedics;  Laterality: Left;   Patient Active Problem List   Diagnosis Date Noted   Goals of care, counseling/discussion 01/17/2022   Grief 01/17/2022   Secondary hypercoagulable state (HCC) 03/16/2021   Dizziness 03/10/2021   Orthostatic hypotension 10/15/2020   Presbycusis of both ears 03/08/2020   Mixed stress and urge urinary incontinence 12/02/2019   Macrocytosis 12/01/2019   Nutritional anemia 12/01/2019   S/P total knee replacement 01/27/2019   Recurrent left knee instability 07/05/2018   Respiratory failure with hypoxia (HCC) 08/30/2017   Hypoxemia    Heart failure with preserved ejection fraction (HCC), Grade 3 diastolic dysfunction 03/26/2017   PAF (paroxysmal atrial fibrillation) (HCC)    Dyspnea 03/19/2017   Encounter for preventive health examination 02/17/2016   Sensorineural hearing loss (SNHL), bilateral 01/26/2016   Hypomagnesemia 04/24/2014   Hemiparesis affecting left side as late effect of cerebrovascular accident (HCC) 04/24/2014   Nontraumatic cerebral hemorrhage (HCC) 04/30/2012   DM (diabetes mellitus) with complications (HCC) 03/04/2010   OSTEOPENIA 01/21/2009   UNSPECIFIED VITAMIN D  DEFICIENCY 11/19/2007   HYPERCHOLESTEROLEMIA 10/25/2006   GASTROESOPHAGEAL REFLUX, NO ESOPHAGITIS 10/25/2006   DIVERTICULOSIS OF COLON 10/25/2006  Osteoarthritis 10/25/2006   CERVICAL SPINE DISORDER, NOS 10/25/2006    PCP: Shayne, MD  REFERRING PROVIDER: Shayne, MD  REFERRING DIAG: debility  THERAPY DIAG:  Muscle weakness (generalized)  Hemiplegia and hemiparesis following cerebral infarction affecting left non-dominant side (HCC)  Unsteadiness on feet  Left shoulder pain, unspecified chronicity  Ataxia  Difficulty in walking, not elsewhere classified  Cervicalgia  Pain in left ankle and joints of left foot  Late effects of CVA  (cerebrovascular accident)  Rationale for Evaluation and Treatment: Rehabilitation  ONSET DATE: 04/29/24  SUBJECTIVE:   SUBJECTIVE STATEMENT: Continues to report soreness in the knee and the ankle  Patient has been a patient of mine off and on for many years, she has had many different diagnoses, she had a CVA about 13 years ago, she had a left ankle fracture with ORIF, she had a lot of issues with pain and wounds, so the hardware was removed last August, she has done pretty well but tends to back slide without PT with her ability to walk and transfer.  She has a caregiver about 11 hours a day.  She has been able to walk with me in PT, no one else has walked with her   PERTINENT HISTORY: CVA, CHF, HTN, A-fib, TKA PAIN:  Are you having pain? Yes: NPRS scale: 4-5/10 Pain location: left upper trap and neck, left ankle and knee  Pain description: ache sore Aggravating factors: neck and shoulder always hurt, knee and ankle hurt with transfers Relieving factors: massage, heat  PRECAUTIONS: None  RED FLAGS: None   WEIGHT BEARING RESTRICTIONS: No  FALLS:  Has patient fallen in last 6 months? No  LIVING ENVIRONMENT: Lives with: lives alone Lives in: House/apartment Stairs: No Has following equipment at home: Environmental consultant - 2 wheeled, Wheelchair (manual), shower chair, bed side commode, Grab bars, and Ramped entry care giver 11 hours a day  OCCUPATION: retired  PLOF: Needs assistance with homemaking, Needs assistance with transfers, and Leisure: plays bridge, she has 11 hours of an aide at home that helps with meals, going to MD's, dressing and bathing, is alone at night  PATIENT GOALS: walk, have less pain, transfer without difficulty  NEXT MD VISIT: none scheduled  OBJECTIVE:   DIAGNOSTIC FINDINGS: none performed  COGNITION: Overall cognitive status: Within functional limits for tasks assessed     SENSATION: WFL POSTURE: rounded shoulders and forward head  PALPATION: Left  knee and ankle are swollen and tender, left upper and neck  LOWER EXTREMITY ROM:  Active ROM Right eval Left eval  Hip flexion    Hip extension    Hip abduction    Hip adduction    Hip internal rotation    Hip external rotation    Knee flexion  90  Knee extension  0  Ankle dorsiflexion  5  Ankle plantarflexion  30  Ankle inversion  5  Ankle eversion  0   (Blank rows = not tested)  LOWER EXTREMITY MMT:  MMT Right eval Left eval  Hip flexion 3+ 3+  Hip extension    Hip abduction 4- 3+  Hip adduction 4- 3+  Hip internal rotation    Hip external rotation    Knee flexion 4- 3+  Knee extension 4- 3+  Ankle dorsiflexion  3+  Ankle plantarflexion    Ankle inversion    Ankle eversion     (Blank rows = not tested)  FUNCTIONAL TESTS:  Transfers Max A, left knee gives out and goes into ER  and tends to bow into varus, has not walked since I discharged her 12/27/23.    GAIT: Distance walked: with HHA and w/c follow x 40 feet with pain in the left knee a 7/10, she tends to rotate the left hip posterior and really bring the left knee into extension   TODAY'S TREATMENT:                                                                                                                              DATE:  05/13/24 Nustep level 5 x 8 minutes Red tband clamshells with a lot of cues to do the left hip Ball b/n knees squeeze Red tband left HS curls 2.5# LAQ 2.5# marches Yellow tband left hip adduction Standing weight shifts Gait HHA with w/c follow 90 feet, stopped due to shortness of breath Yellow tband ankle PF/DF  05/08/24 Nustep level 5 x 8 minutes STM to the left upper trap Passive left ankle stretch Red tband left ankle PF/DF Red tband bilateral HS curls 2.5# marches 2.5# LAQ Standing weight shifts Gait with HHA and w/c follow x75 feet   05/05/24 Nustep level 5 x 8 minutes    PATIENT EDUCATION:  Education details: POC Person educated: Patient Education method:  Explanation Education comprehension: verbalized understanding  HOME EXERCISE PROGRAM: TBD  ASSESSMENT:  CLINICAL IMPRESSION: I continue to add some exercises and try to continue to work on her gait and endurance.  She had less ankle and knee pain with walking today and we stopped due to shortness of breath, she has been seeing a pulmonologist and they are working with her on different medications.  Patient is a 83 y.o. female who was seen today for physical therapy evaluation and treatment for debility,    she has not been able to walk or transfer or exercise much since D/C from PT in May.  I have seen her off and on over the past 20+ years and she is having a lot of difficulty controlling the left knee, it buckles and goes into varus and does some hyperextension, with walking 40 feet today she reported knee pain a 7/10,    OBJECTIVE IMPAIRMENTS: Abnormal gait, cardiopulmonary status limiting activity, decreased activity tolerance, decreased balance, decreased coordination, decreased endurance, decreased mobility, difficulty walking, decreased ROM, decreased strength, increased edema, impaired flexibility, improper body mechanics, postural dysfunction, and pain.   REHAB POTENTIAL: Good  CLINICAL DECISION MAKING: Stable/uncomplicated  EVALUATION COMPLEXITY: Low   GOALS: Goals reviewed with patient? Yes  SHORT TERM GOALS: Target date: 05/28/24 Independent with advanced HEP Goal status: INITIAL  LONG TERM GOALS: Target date: 08/04/24  Independent with advanced HEP with caregiver Goal status: INITIAL  2.  Transfer with set up and CGA Goal status: INITIAL  3.  Walk HHA x 150 feet Goal status: INITIAL  4.  Increase left LE strength to 4/5 Goal status: INITIAL  5.  Report neck and shoulder pain decreased 50% Goal status: INITIAL  PLAN:  PT FREQUENCY: 2x/week  PT DURATION: 12 weeks  PLANNED INTERVENTIONS: Therapeutic exercises, Therapeutic activity, Neuromuscular  re-education, Balance training, Gait training, Patient/Family education, Self Care, Joint mobilization, Moist heat, Taping, and Manual therapy  PLAN FOR NEXT SESSION: advance activities really work on stability of the left knee   Aurianna Earlywine W, PT 05/13/2024, 11:06 AM

## 2024-05-14 ENCOUNTER — Ambulatory Visit: Admitting: Podiatry

## 2024-05-15 ENCOUNTER — Encounter: Payer: Self-pay | Admitting: Physical Therapy

## 2024-05-15 ENCOUNTER — Ambulatory Visit: Admitting: Physical Therapy

## 2024-05-15 DIAGNOSIS — R2681 Unsteadiness on feet: Secondary | ICD-10-CM | POA: Diagnosis not present

## 2024-05-15 DIAGNOSIS — M25512 Pain in left shoulder: Secondary | ICD-10-CM | POA: Diagnosis not present

## 2024-05-15 DIAGNOSIS — I69354 Hemiplegia and hemiparesis following cerebral infarction affecting left non-dominant side: Secondary | ICD-10-CM

## 2024-05-15 DIAGNOSIS — R270 Ataxia, unspecified: Secondary | ICD-10-CM | POA: Diagnosis not present

## 2024-05-15 DIAGNOSIS — R262 Difficulty in walking, not elsewhere classified: Secondary | ICD-10-CM

## 2024-05-15 DIAGNOSIS — M6281 Muscle weakness (generalized): Secondary | ICD-10-CM | POA: Diagnosis not present

## 2024-05-15 NOTE — Therapy (Addendum)
 OUTPATIENT PHYSICAL THERAPY LOWER EXTREMITY TREATMENT   Patient Name: Karen Jackson MRN: [id number] DOB:19-Feb-1941, 83 y.o., female Today's Date: 05/15/2024  END OF SESSION:  PT End of Session - 05/15/24 1100     Visit Number 4    Date for Recertification  08/04/24    PT Start Time 1100    PT Stop Time 1145    PT Time Calculation (min) 45 min    Activity Tolerance Patient tolerated treatment well    Behavior During Therapy [facility] for tasks assessed/performed           Past Medical History:  Diagnosis Date   Acute cystitis without hematuria    Acute diastolic CHF (congestive heart failure) (HCC)    Arthritis    Dyspnea    Dysrhythmia    Fever of unknown origin 03/19/2017   Hyperlipidemia    Hypertension    denies at preop   Multifocal pneumonia    Neuromuscular disorder (HCC)    neuropathy left arm and foot   Osteopenia    Paralysis (HCC)    partial left side from CVA    Persistent atrial fibrillation (HCC)    PONV (postoperative nausea and vomiting)    Pre-diabetes    Stroke [facility]) 2013   hemmorahgic   Past Surgical History:  Procedure Laterality Date   ANKLE SURGERY     APPENDECTOMY     CHOLECYSTECTOMY     HARDWARE REMOVAL Left 03/29/2023   Procedure: HARDWARE REMOVAL;  Surgeon: Kit Rush, MD;  Location: [facility];  Service: Orthopedics;  Laterality: Left;   HERNIA REPAIR     Esophagus   INCISION AND DRAINAGE OF WOUND Left 03/29/2023   Procedure: LEFT ANKLE WOUND IRRIGATION AND DEBRIDEMENT WOUND;  Surgeon: Kit Rush, MD;  Location: [facility];  Service: Orthopedics;  Laterality: Left;   JOINT REPLACEMENT     total- right partial- left   MASTECTOMY PARTIAL / LUMPECTOMY  2012   left   ORIF ANKLE FRACTURE Left 07/20/2018   Procedure: OPEN REDUCTION INTERNAL FIXATION (ORIF) ANKLE FRACTURE;  Surgeon: Kit Rush, MD;  Location: [facility];  Service: Orthopedics;  Laterality: Left;   TOTAL KNEE ARTHROPLASTY Left 01/27/2019   Procedure:  TOTAL KNEE ARTHROPLASTY;  Surgeon: Rubie Kemps, MD;  Location: [facility];  Service: Orthopedics;  Laterality: Left;   Patient Active Problem List   Diagnosis Date Noted   Goals of care, counseling/discussion 01/17/2022   Grief 01/17/2022   Secondary hypercoagulable state (HCC) 03/16/2021   Dizziness 03/10/2021   Orthostatic hypotension 10/15/2020   Presbycusis of both ears 03/08/2020   Mixed stress and urge urinary incontinence 12/02/2019   Macrocytosis 12/01/2019   Nutritional anemia 12/01/2019   S/P total knee replacement 01/27/2019   Recurrent left knee instability 07/05/2018   Respiratory failure with hypoxia (HCC) 08/30/2017   Hypoxemia    Heart failure with preserved ejection fraction (HCC), Grade 3 diastolic dysfunction 03/26/2017   PAF (paroxysmal atrial fibrillation) (HCC)    Dyspnea 03/19/2017   Encounter for preventive health examination 02/17/2016   Sensorineural hearing loss (SNHL), bilateral 01/26/2016   Hypomagnesemia 04/24/2014   Hemiparesis affecting left side as late effect of cerebrovascular accident (HCC) 04/24/2014   Nontraumatic cerebral hemorrhage (HCC) 04/30/2012   DM (diabetes mellitus) with complications (HCC) 03/04/2010   OSTEOPENIA 01/21/2009   UNSPECIFIED VITAMIN D  DEFICIENCY 11/19/2007   HYPERCHOLESTEROLEMIA 10/25/2006   GASTROESOPHAGEAL REFLUX, NO ESOPHAGITIS 10/25/2006   DIVERTICULOSIS OF COLON 10/25/2006   Osteoarthritis 10/25/2006   CERVICAL  SPINE DISORDER, NOS 10/25/2006    PCP: Shayne, MD  REFERRING PROVIDER: Shayne, MD  REFERRING DIAG: debility  THERAPY DIAG:  Muscle weakness (generalized)  Hemiplegia and hemiparesis following cerebral infarction affecting left non-dominant side (HCC)  Unsteadiness on feet  Left shoulder pain, unspecified chronicity  Difficulty in walking, not elsewhere classified  Rationale for Evaluation and Treatment: Rehabilitation  ONSET DATE: 04/29/24  SUBJECTIVE:   SUBJECTIVE STATEMENT: My shoulder  hurt, My knees are sore  Patient has been a patient of mine off and on for many years, she has had many different diagnoses, she had a CVA about 13 years ago, she had a left ankle fracture with ORIF, she had a lot of issues with pain and wounds, so the hardware was removed last August, she has done pretty well but tends to back slide without PT with her ability to walk and transfer.  She has a caregiver about 11 hours a day.  She has been able to walk with me in PT, no one else has walked with her   PERTINENT HISTORY: CVA, CHF, HTN, A-fib, TKA PAIN:  Are you having pain? Yes: NPRS scale: 5-6/10 Pain location: left upper trap and neck, left ankle and knee  Pain description: ache sore Aggravating factors: neck and shoulder always hurt, knee and ankle hurt with transfers Relieving factors: massage, heat  PRECAUTIONS: None  RED FLAGS: None   WEIGHT BEARING RESTRICTIONS: No  FALLS:  Has patient fallen in last 6 months? No  LIVING ENVIRONMENT: Lives with: lives alone Lives in: House/apartment Stairs: No Has following equipment at home: Environmental consultant - 2 wheeled, Wheelchair (manual), shower chair, bed side commode, Grab bars, and Ramped entry care giver 11 hours a day  OCCUPATION: retired  PLOF: Needs assistance with homemaking, Needs assistance with transfers, and Leisure: plays bridge, she has 11 hours of an aide at home that helps with meals, going to MD's, dressing and bathing, is alone at night  PATIENT GOALS: walk, have less pain, transfer without difficulty  NEXT MD VISIT: none scheduled  OBJECTIVE:   DIAGNOSTIC FINDINGS: none performed  COGNITION: Overall cognitive status: Within functional limits for tasks assessed     SENSATION: WFL POSTURE: rounded shoulders and forward head  PALPATION: Left knee and ankle are swollen and tender, left upper and neck  LOWER EXTREMITY ROM:  Active ROM Left eval Left 05/15/24  Hip flexion    Hip extension    Hip abduction    Hip  adduction    Hip internal rotation    Hip external rotation    Knee flexion 90 91  Knee extension 0 0  Ankle dorsiflexion 5   Ankle plantarflexion 30   Ankle inversion 5   Ankle eversion 0    (Blank rows = not tested)  LOWER EXTREMITY MMT:  MMT Right eval Left eval  Hip flexion 3+ 3+  Hip extension    Hip abduction 4- 3+  Hip adduction 4- 3+  Hip internal rotation    Hip external rotation    Knee flexion 4- 3+  Knee extension 4- 3+  Ankle dorsiflexion  3+  Ankle plantarflexion    Ankle inversion    Ankle eversion     (Blank rows = not tested)  FUNCTIONAL TESTS:  Transfers Max A, left knee gives out and goes into ER and tends to bow into varus, has not walked since I discharged her 12/27/23.    GAIT: Distance walked: with HHA and w/c follow x 40 feet with  pain in the left knee a 7/10, she tends to rotate the left hip posterior and really bring the left knee into extension   TODAY'S TREATMENT:                                                                                                                              DATE:  05/15/24 Nustep level 5 x 8 minutes Manual resistance clamshells with a lot of cues to do the left hip Red tband HS curls Ball b/n knees squeeze 2.5# LAQ 2.5# marches STM to bilat UT and cervical spine some with thera-gun Approximation to L digits   Yellow tband ankle PF/DF Gait HHA with w/c follow 90 feet, stopped due to shortness of breath  05/13/24 Nustep level 5 x 8 minutes Red tband clamshells with a lot of cues to do the left hip Ball b/n knees squeeze Red tband left HS curls 2.5# LAQ 2.5# marches Yellow tband left hip adduction Standing weight shifts Gait HHA with w/c follow 90 feet, stopped due to shortness of breath Yellow tband ankle PF/DF  05/08/24 Nustep level 5 x 8 minutes STM to the left upper trap Passive left ankle stretch Red tband left ankle PF/DF Red tband bilateral HS curls 2.5# marches 2.5# LAQ Standing weight  shifts Gait with HHA and w/c follow x75 feet   05/05/24 Nustep level 5 x 8 minutes    PATIENT EDUCATION:  Education details: POC Person educated: Patient Education method: Explanation Education comprehension: verbalized understanding  HOME EXERCISE PROGRAM: TBD  ASSESSMENT:  CLINICAL IMPRESSION: I continue to add some exercises and try to continue to work on her gait and endurance.  She reported some stinging in her L ankle and knee with walking today, and we stopped due to shortness of breath. All exercise interventions completed well without reports of increase pain.   Patient is a 83 y.o. female who was seen today for physical therapy evaluation and treatment for debility,    she has not been able to walk or transfer or exercise much since D/C from PT in May.  I have seen her off and on over the past 20+ years and she is having a lot of difficulty controlling the left knee, it buckles and goes into varus and does some hyperextension, with walking 40 feet today she reported knee pain a 7/10,    OBJECTIVE IMPAIRMENTS: Abnormal gait, cardiopulmonary status limiting activity, decreased activity tolerance, decreased balance, decreased coordination, decreased endurance, decreased mobility, difficulty walking, decreased ROM, decreased strength, increased edema, impaired flexibility, improper body mechanics, postural dysfunction, and pain.   REHAB POTENTIAL: Good  CLINICAL DECISION MAKING: Stable/uncomplicated  EVALUATION COMPLEXITY: Low   GOALS: Goals reviewed with patient? Yes  SHORT TERM GOALS: Target date: 05/28/24 Independent with advanced HEP Goal status: INITIAL  LONG TERM GOALS: Target date: 08/04/24  Independent with advanced HEP with caregiver Goal status: INITIAL  2.  Transfer with set up and CGA Goal status: ongoing  05/15/24  3.  Walk HHA x 150 feet Goal status: Progressing 05/15/24  4.  Increase left LE strength to 4/5 Goal status: INITIAL  5.  Report neck  and shoulder pain decreased 50% Goal status: INITIAL  PLAN:  PT FREQUENCY: 2x/week  PT DURATION: 12 weeks  PLANNED INTERVENTIONS: Therapeutic exercises, Therapeutic activity, Neuromuscular re-education, Balance training, Gait training, Patient/Family education, Self Care, Joint mobilization, Moist heat, Taping, and Manual therapy  PLAN FOR NEXT SESSION: advance activities really work on stability of the left knee   Tanda KANDICE Sorrow, PTA 05/15/2024, 11:01 AM

## 2024-05-16 ENCOUNTER — Other Ambulatory Visit: Payer: Self-pay | Admitting: Cardiology

## 2024-05-16 DIAGNOSIS — E7800 Pure hypercholesterolemia, unspecified: Secondary | ICD-10-CM

## 2024-05-19 DIAGNOSIS — Z23 Encounter for immunization: Secondary | ICD-10-CM | POA: Diagnosis not present

## 2024-05-20 ENCOUNTER — Ambulatory Visit: Admitting: Physical Therapy

## 2024-05-20 ENCOUNTER — Encounter: Payer: Self-pay | Admitting: Physical Therapy

## 2024-05-20 DIAGNOSIS — M25512 Pain in left shoulder: Secondary | ICD-10-CM | POA: Diagnosis not present

## 2024-05-20 DIAGNOSIS — I6990 Unspecified sequelae of unspecified cerebrovascular disease: Secondary | ICD-10-CM

## 2024-05-20 DIAGNOSIS — M6281 Muscle weakness (generalized): Secondary | ICD-10-CM | POA: Diagnosis not present

## 2024-05-20 DIAGNOSIS — I69354 Hemiplegia and hemiparesis following cerebral infarction affecting left non-dominant side: Secondary | ICD-10-CM

## 2024-05-20 DIAGNOSIS — M25572 Pain in left ankle and joints of left foot: Secondary | ICD-10-CM

## 2024-05-20 DIAGNOSIS — R270 Ataxia, unspecified: Secondary | ICD-10-CM

## 2024-05-20 DIAGNOSIS — M542 Cervicalgia: Secondary | ICD-10-CM

## 2024-05-20 DIAGNOSIS — R262 Difficulty in walking, not elsewhere classified: Secondary | ICD-10-CM | POA: Diagnosis not present

## 2024-05-20 DIAGNOSIS — R2681 Unsteadiness on feet: Secondary | ICD-10-CM

## 2024-05-20 NOTE — Therapy (Signed)
 OUTPATIENT PHYSICAL THERAPY LOWER EXTREMITY TREATMENT   Patient Name: Karen Jackson MRN: [id number] DOB:July 26, 1941, 83 y.o., female Today's Date: 05/20/2024  END OF SESSION:  PT End of Session - 05/20/24 1101     Visit Number 5    Date for Recertification  08/04/24    Authorization Type Medicare    PT Start Time 1057    PT Stop Time 1143    PT Time Calculation (min) 46 min    Activity Tolerance Patient tolerated treatment well    Behavior During Therapy [facility] for tasks assessed/performed           Past Medical History:  Diagnosis Date   Acute cystitis without hematuria    Acute diastolic CHF (congestive heart failure) (HCC)    Arthritis    Dyspnea    Dysrhythmia    Fever of unknown origin 03/19/2017   Hyperlipidemia    Hypertension    denies at preop   Multifocal pneumonia    Neuromuscular disorder (HCC)    neuropathy left arm and foot   Osteopenia    Paralysis (HCC)    partial left side from CVA    Persistent atrial fibrillation (HCC)    PONV (postoperative nausea and vomiting)    Pre-diabetes    Stroke [facility]) 2013   hemmorahgic   Past Surgical History:  Procedure Laterality Date   ANKLE SURGERY     APPENDECTOMY     CHOLECYSTECTOMY     HARDWARE REMOVAL Left 03/29/2023   Procedure: HARDWARE REMOVAL;  Surgeon: Kit Rush, MD;  Location: [facility];  Service: Orthopedics;  Laterality: Left;   HERNIA REPAIR     Esophagus   INCISION AND DRAINAGE OF WOUND Left 03/29/2023   Procedure: LEFT ANKLE WOUND IRRIGATION AND DEBRIDEMENT WOUND;  Surgeon: Kit Rush, MD;  Location: [facility];  Service: Orthopedics;  Laterality: Left;   JOINT REPLACEMENT     total- right partial- left   MASTECTOMY PARTIAL / LUMPECTOMY  2012   left   ORIF ANKLE FRACTURE Left 07/20/2018   Procedure: OPEN REDUCTION INTERNAL FIXATION (ORIF) ANKLE FRACTURE;  Surgeon: Kit Rush, MD;  Location: [facility];  Service: Orthopedics;  Laterality: Left;   TOTAL KNEE  ARTHROPLASTY Left 01/27/2019   Procedure: TOTAL KNEE ARTHROPLASTY;  Surgeon: Rubie Kemps, MD;  Location: [facility];  Service: Orthopedics;  Laterality: Left;   Patient Active Problem List   Diagnosis Date Noted   Goals of care, counseling/discussion 01/17/2022   Grief 01/17/2022   Secondary hypercoagulable state 03/16/2021   Dizziness 03/10/2021   Orthostatic hypotension 10/15/2020   Presbycusis of both ears 03/08/2020   Mixed stress and urge urinary incontinence 12/02/2019   Macrocytosis 12/01/2019   Nutritional anemia 12/01/2019   S/P total knee replacement 01/27/2019   Recurrent left knee instability 07/05/2018   Respiratory failure with hypoxia (HCC) 08/30/2017   Hypoxemia    Heart failure with preserved ejection fraction (HCC), Grade 3 diastolic dysfunction 03/26/2017   PAF (paroxysmal atrial fibrillation) (HCC)    Dyspnea 03/19/2017   Encounter for preventive health examination 02/17/2016   Sensorineural hearing loss (SNHL), bilateral 01/26/2016   Hypomagnesemia 04/24/2014   Hemiparesis affecting left side as late effect of cerebrovascular accident (HCC) 04/24/2014   Nontraumatic cerebral hemorrhage (HCC) 04/30/2012   DM (diabetes mellitus) with complications (HCC) 03/04/2010   OSTEOPENIA 01/21/2009   UNSPECIFIED VITAMIN D  DEFICIENCY 11/19/2007   HYPERCHOLESTEROLEMIA 10/25/2006   GASTROESOPHAGEAL REFLUX, NO ESOPHAGITIS 10/25/2006   DIVERTICULOSIS OF COLON 10/25/2006  Osteoarthritis 10/25/2006   CERVICAL SPINE DISORDER, NOS 10/25/2006    PCP: Shayne, MD  REFERRING PROVIDER: Shayne, MD  REFERRING DIAG: debility  THERAPY DIAG:  Muscle weakness (generalized)  Hemiplegia and hemiparesis following cerebral infarction affecting left non-dominant side (HCC)  Unsteadiness on feet  Left shoulder pain, unspecified chronicity  Difficulty in walking, not elsewhere classified  Ataxia  Cervicalgia  Pain in left ankle and joints of left foot  Late effects of CVA  (cerebrovascular accident)  Rationale for Evaluation and Treatment: Rehabilitation  ONSET DATE: 04/29/24  SUBJECTIVE:   SUBJECTIVE STATEMENT: No falls, doing okay, did not get up much this weekend  Patient has been a patient of mine off and on for many years, she has had many different diagnoses, she had a CVA about 13 years ago, she had a left ankle fracture with ORIF, she had a lot of issues with pain and wounds, so the hardware was removed last August, she has done pretty well but tends to back slide without PT with her ability to walk and transfer.  She has a caregiver about 11 hours a day.  She has been able to walk with me in PT, no one else has walked with her   PERTINENT HISTORY: CVA, CHF, HTN, A-fib, TKA PAIN:  Are you having pain? Yes: NPRS scale: 5-6/10 Pain location: left upper trap and neck, left ankle and knee  Pain description: ache sore Aggravating factors: neck and shoulder always hurt, knee and ankle hurt with transfers Relieving factors: massage, heat  PRECAUTIONS: None  RED FLAGS: None   WEIGHT BEARING RESTRICTIONS: No  FALLS:  Has patient fallen in last 6 months? No  LIVING ENVIRONMENT: Lives with: lives alone Lives in: House/apartment Stairs: No Has following equipment at home: Environmental consultant - 2 wheeled, Wheelchair (manual), shower chair, bed side commode, Grab bars, and Ramped entry care giver 11 hours a day  OCCUPATION: retired  PLOF: Needs assistance with homemaking, Needs assistance with transfers, and Leisure: plays bridge, she has 11 hours of an aide at home that helps with meals, going to MD's, dressing and bathing, is alone at night  PATIENT GOALS: walk, have less pain, transfer without difficulty  NEXT MD VISIT: none scheduled  OBJECTIVE:   DIAGNOSTIC FINDINGS: none performed  COGNITION: Overall cognitive status: Within functional limits for tasks assessed     SENSATION: WFL POSTURE: rounded shoulders and forward head  PALPATION: Left  knee and ankle are swollen and tender, left upper and neck  LOWER EXTREMITY ROM:  Active ROM Left eval Left 05/15/24  Hip flexion    Hip extension    Hip abduction    Hip adduction    Hip internal rotation    Hip external rotation    Knee flexion 90 91  Knee extension 0 0  Ankle dorsiflexion 5   Ankle plantarflexion 30   Ankle inversion 5   Ankle eversion 0    (Blank rows = not tested)  LOWER EXTREMITY MMT:  MMT Right eval Left eval  Hip flexion 3+ 3+  Hip extension    Hip abduction 4- 3+  Hip adduction 4- 3+  Hip internal rotation    Hip external rotation    Knee flexion 4- 3+  Knee extension 4- 3+  Ankle dorsiflexion  3+  Ankle plantarflexion    Ankle inversion    Ankle eversion     (Blank rows = not tested)  FUNCTIONAL TESTS:  Transfers Max A, left knee gives out and goes into  ER and tends to bow into varus, has not walked since I discharged her 12/27/23.    GAIT: Distance walked: with HHA and w/c follow x 40 feet with pain in the left knee a 7/10, she tends to rotate the left hip posterior and really bring the left knee into extension   TODAY'S TREATMENT:                                                                                                                              DATE:  05/20/24 Nustep level 5 x 8 minutes Red tband hip adduction and abduction, cues needed for form and to decreased the compensation of the left shoulder as she tends to elevate the left shoulder with left leg motions 2.5# marches 2.5# LAQ Yellow tband left ankle PF/DF and did some eversion STM to the left neck and upper trap and some into the left rhomboid Gait with HHA and W/C follow  30 feet and stopped due to left knee pain, then PT with some passive ROM and approximation and we walked another 70 feet, cues to keep foot narrow   05/15/24 Nustep level 5 x 8 minutes Manual resistance clamshells with a lot of cues to do the left hip Red tband HS curls Ball b/n knees  squeeze 2.5# LAQ 2.5# marches STM to bilat UT and cervical spine some with thera-gun Approximation to L digits   Yellow tband ankle PF/DF Gait HHA with w/c follow 90 feet, stopped due to shortness of breath  05/13/24 Nustep level 5 x 8 minutes Red tband clamshells with a lot of cues to do the left hip Ball b/n knees squeeze Red tband left HS curls 2.5# LAQ 2.5# marches Yellow tband left hip adduction Standing weight shifts Gait HHA with w/c follow 90 feet, stopped due to shortness of breath Yellow tband ankle PF/DF  05/08/24 Nustep level 5 x 8 minutes STM to the left upper trap Passive left ankle stretch Red tband left ankle PF/DF Red tband bilateral HS curls 2.5# marches 2.5# LAQ Standing weight shifts Gait with HHA and w/c follow x75 feet   05/05/24 Nustep level 5 x 8 minutes    PATIENT EDUCATION:  Education details: POC Person educated: Patient Education method: Explanation Education comprehension: verbalized understanding  HOME EXERCISE PROGRAM: TBD  ASSESSMENT:  CLINICAL IMPRESSION: Added some left ankle Eversion.  Needs cues to decreased the left shoulder elevation and synergystic motions with left LE motions and exercises.  Had left knee pain with walking today, I asked her to try to have a more narrow walk as she was putting the left foot out wide and I feel could have caused some left knee stress.  This helped but she was very short of breath and stopped at the 70 feet on the second walk.  Patient is a 83 y.o. female who was seen today for physical therapy evaluation and treatment for debility,    she has not been able  to walk or transfer or exercise much since D/C from PT in May.  I have seen her off and on over the past 20+ years and she is having a lot of difficulty controlling the left knee, it buckles and goes into varus and does some hyperextension, with walking 40 feet today she reported knee pain a 7/10,    OBJECTIVE IMPAIRMENTS: Abnormal gait,  cardiopulmonary status limiting activity, decreased activity tolerance, decreased balance, decreased coordination, decreased endurance, decreased mobility, difficulty walking, decreased ROM, decreased strength, increased edema, impaired flexibility, improper body mechanics, postural dysfunction, and pain.   REHAB POTENTIAL: Good  CLINICAL DECISION MAKING: Stable/uncomplicated  EVALUATION COMPLEXITY: Low   GOALS: Goals reviewed with patient? Yes  SHORT TERM GOALS: Target date: 05/28/24 Independent with advanced HEP Goal status: progressing as she has one caregive that can do this 05/20/24  LONG TERM GOALS: Target date: 08/04/24  Independent with advanced HEP with caregiver Goal status: INITIAL  2.  Transfer with set up and CGA Goal status: ongoing 05/15/24  3.  Walk HHA x 150 feet Goal status: Progressing 05/15/24  4.  Increase left LE strength to 4/5 Goal status: INITIAL  5.  Report neck and shoulder pain decreased 50% Goal status: INITIAL  PLAN:  PT FREQUENCY: 2x/week  PT DURATION: 12 weeks  PLANNED INTERVENTIONS: Therapeutic exercises, Therapeutic activity, Neuromuscular re-education, Balance training, Gait training, Patient/Family education, Self Care, Joint mobilization, Moist heat, Taping, and Manual therapy  PLAN FOR NEXT SESSION: advance activities really work on stability of the left knee   Teshawn Moan W, PT 05/20/2024, 11:02 AM

## 2024-05-21 ENCOUNTER — Encounter: Payer: Self-pay | Admitting: Podiatry

## 2024-05-21 ENCOUNTER — Ambulatory Visit (INDEPENDENT_AMBULATORY_CARE_PROVIDER_SITE_OTHER): Admitting: Podiatry

## 2024-05-21 DIAGNOSIS — B351 Tinea unguium: Secondary | ICD-10-CM

## 2024-05-21 DIAGNOSIS — E118 Type 2 diabetes mellitus with unspecified complications: Secondary | ICD-10-CM

## 2024-05-21 DIAGNOSIS — M79609 Pain in unspecified limb: Secondary | ICD-10-CM | POA: Diagnosis not present

## 2024-05-21 NOTE — Progress Notes (Addendum)
 This patient returns to my office for at risk foot care.  This patient requires this care by a professional since this patient will be at risk due to having diabetes.  This patient is unable to cut nails herself since the patient cannot reach her nails.These nails are painful walking and wearing shoes. Patient has coagulation defect due to taking eliquis . Patient presents to the office with her caregiver and in a wheelchair. This patient presents for at risk foot care today.  General Appearance  Alert, conversant and in no acute stress.  Vascular  Dorsalis pedis and posterior tibial  pulses are palpable  bilaterally.  Capillary return is within normal limits  bilaterally. Temperature is within normal limits  bilaterally.  Neurologic  Senn-Weinstein monofilament wire test within normal limits  bilaterally. Muscle power within normal limits bilaterally.  Nails Thick disfigured discolored nails with subungual debris  from hallux to fifth toes bilaterally. No evidence of bacterial infection or drainage bilaterally.  Orthopedic  No limitations of motion  feet .  No crepitus or effusions noted.  No bony pathology or digital deformities noted.  Skin  normotropic skin with no porokeratosis noted bilaterally.  No signs of infections or ulcers noted.     Onychomycosis  Pain in right toes  Pain in left toes  Consent was obtained for treatment procedures.   Mechanical debridement of nails 1-5  bilaterally performed with a nail nipper.  Filed with dremel without incident.    Return office visit  4   months                   Told patient to return for periodic foot care and evaluation due to potential at risk complications.   Karen Jackson DPM    This patient is concerned that her toes hurt after nail treatment.  She has called twice and even sent pictures to the office showing injury to distal aspect of her toe.  She was told to come in for me to examine on October 2. She did want to see me again.  Obviously there was a toe injury distal aspect foot.  She is on eliquis  which may have led to her toe bleeding.  I alerted this to Dr.  Gershon for him to review this case.

## 2024-05-22 ENCOUNTER — Ambulatory Visit: Admitting: Physical Therapy

## 2024-05-22 ENCOUNTER — Encounter: Payer: Self-pay | Admitting: Physical Therapy

## 2024-05-22 DIAGNOSIS — I6990 Unspecified sequelae of unspecified cerebrovascular disease: Secondary | ICD-10-CM

## 2024-05-22 DIAGNOSIS — M25512 Pain in left shoulder: Secondary | ICD-10-CM | POA: Diagnosis not present

## 2024-05-22 DIAGNOSIS — R262 Difficulty in walking, not elsewhere classified: Secondary | ICD-10-CM

## 2024-05-22 DIAGNOSIS — M25572 Pain in left ankle and joints of left foot: Secondary | ICD-10-CM

## 2024-05-22 DIAGNOSIS — R2681 Unsteadiness on feet: Secondary | ICD-10-CM

## 2024-05-22 DIAGNOSIS — I69354 Hemiplegia and hemiparesis following cerebral infarction affecting left non-dominant side: Secondary | ICD-10-CM | POA: Diagnosis not present

## 2024-05-22 DIAGNOSIS — R270 Ataxia, unspecified: Secondary | ICD-10-CM

## 2024-05-22 DIAGNOSIS — M6281 Muscle weakness (generalized): Secondary | ICD-10-CM

## 2024-05-22 DIAGNOSIS — R293 Abnormal posture: Secondary | ICD-10-CM

## 2024-05-22 DIAGNOSIS — M542 Cervicalgia: Secondary | ICD-10-CM

## 2024-05-22 NOTE — Therapy (Signed)
 OUTPATIENT PHYSICAL THERAPY LOWER EXTREMITY TREATMENT   Patient Name: Karen Jackson MRN: [id number] DOB:1941-08-23, 83 y.o., female Today's Date: 05/22/2024  END OF SESSION:  PT End of Session - 05/22/24 1105     Visit Number 6    Date for Recertification  08/04/24    Authorization Type Medicare    PT Start Time 1057    PT Stop Time 1144    PT Time Calculation (min) 47 min    Behavior During Therapy [facility] for tasks assessed/performed           Past Medical History:  Diagnosis Date   Acute cystitis without hematuria    Acute diastolic CHF (congestive heart failure) (HCC)    Arthritis    Dyspnea    Dysrhythmia    Fever of unknown origin 03/19/2017   Hyperlipidemia    Hypertension    denies at preop   Multifocal pneumonia    Neuromuscular disorder (HCC)    neuropathy left arm and foot   Osteopenia    Paralysis (HCC)    partial left side from CVA    Persistent atrial fibrillation (HCC)    PONV (postoperative nausea and vomiting)    Pre-diabetes    Stroke [facility]) 2013   hemmorahgic   Past Surgical History:  Procedure Laterality Date   ANKLE SURGERY     APPENDECTOMY     CHOLECYSTECTOMY     HARDWARE REMOVAL Left 03/29/2023   Procedure: HARDWARE REMOVAL;  Surgeon: Kit Rush, MD;  Location: [facility];  Service: Orthopedics;  Laterality: Left;   HERNIA REPAIR     Esophagus   INCISION AND DRAINAGE OF WOUND Left 03/29/2023   Procedure: LEFT ANKLE WOUND IRRIGATION AND DEBRIDEMENT WOUND;  Surgeon: Kit Rush, MD;  Location: [facility];  Service: Orthopedics;  Laterality: Left;   JOINT REPLACEMENT     total- right partial- left   MASTECTOMY PARTIAL / LUMPECTOMY  2012   left   ORIF ANKLE FRACTURE Left 07/20/2018   Procedure: OPEN REDUCTION INTERNAL FIXATION (ORIF) ANKLE FRACTURE;  Surgeon: Kit Rush, MD;  Location: [facility];  Service: Orthopedics;  Laterality: Left;   TOTAL KNEE ARTHROPLASTY Left 01/27/2019   Procedure: TOTAL KNEE ARTHROPLASTY;   Surgeon: Rubie Kemps, MD;  Location: [facility];  Service: Orthopedics;  Laterality: Left;   Patient Active Problem List   Diagnosis Date Noted   Goals of care, counseling/discussion 01/17/2022   Grief 01/17/2022   Secondary hypercoagulable state 03/16/2021   Dizziness 03/10/2021   Orthostatic hypotension 10/15/2020   Presbycusis of both ears 03/08/2020   Mixed stress and urge urinary incontinence 12/02/2019   Macrocytosis 12/01/2019   Nutritional anemia 12/01/2019   S/P total knee replacement 01/27/2019   Recurrent left knee instability 07/05/2018   Respiratory failure with hypoxia (HCC) 08/30/2017   Hypoxemia    Heart failure with preserved ejection fraction (HCC), Grade 3 diastolic dysfunction 03/26/2017   PAF (paroxysmal atrial fibrillation) (HCC)    Dyspnea 03/19/2017   Encounter for preventive health examination 02/17/2016   Sensorineural hearing loss (SNHL), bilateral 01/26/2016   Hypomagnesemia 04/24/2014   Hemiparesis affecting left side as late effect of cerebrovascular accident (HCC) 04/24/2014   Nontraumatic cerebral hemorrhage (HCC) 04/30/2012   DM (diabetes mellitus) with complications (HCC) 03/04/2010   OSTEOPENIA 01/21/2009   UNSPECIFIED VITAMIN D  DEFICIENCY 11/19/2007   HYPERCHOLESTEROLEMIA 10/25/2006   GASTROESOPHAGEAL REFLUX, NO ESOPHAGITIS 10/25/2006   DIVERTICULOSIS OF COLON 10/25/2006   Osteoarthritis 10/25/2006   CERVICAL SPINE DISORDER, NOS 10/25/2006  PCP: Shayne, MD  REFERRING PROVIDER: Shayne, MD  REFERRING DIAG: debility  THERAPY DIAG:  Muscle weakness (generalized)  Hemiplegia and hemiparesis following cerebral infarction affecting left non-dominant side (HCC)  Unsteadiness on feet  Left shoulder pain, unspecified chronicity  Difficulty in walking, not elsewhere classified  Ataxia  Cervicalgia  Pain in left ankle and joints of left foot  Late effects of CVA (cerebrovascular accident)  Abnormal posture  Rationale for Evaluation  and Treatment: Rehabilitation  ONSET DATE: 04/29/24  SUBJECTIVE:   SUBJECTIVE STATEMENT: I was concerned about my knee hurting the other day Reports had toe nails clipped yesterday andmy toes are sore  Patient has been a patient of mine off and on for many years, she has had many different diagnoses, she had a CVA about 13 years ago, she had a left ankle fracture with ORIF, she had a lot of issues with pain and wounds, so the hardware was removed last August, she has done pretty well but tends to back slide without PT with her ability to walk and transfer.  She has a caregiver about 11 hours a day.  She has been able to walk with me in PT, no one else has walked with her   PERTINENT HISTORY: CVA, CHF, HTN, A-fib, TKA PAIN:  Are you having pain? Yes: NPRS scale: 5-6/10 Pain location: left upper trap and neck, left ankle and knee  Pain description: ache sore Aggravating factors: neck and shoulder always hurt, knee and ankle hurt with transfers Relieving factors: massage, heat  PRECAUTIONS: None  RED FLAGS: None   WEIGHT BEARING RESTRICTIONS: No  FALLS:  Has patient fallen in last 6 months? No  LIVING ENVIRONMENT: Lives with: lives alone Lives in: House/apartment Stairs: No Has following equipment at home: Environmental consultant - 2 wheeled, Wheelchair (manual), shower chair, bed side commode, Grab bars, and Ramped entry care giver 11 hours a day  OCCUPATION: retired  PLOF: Needs assistance with homemaking, Needs assistance with transfers, and Leisure: plays bridge, she has 11 hours of an aide at home that helps with meals, going to MD's, dressing and bathing, is alone at night  PATIENT GOALS: walk, have less pain, transfer without difficulty  NEXT MD VISIT: none scheduled  OBJECTIVE:   DIAGNOSTIC FINDINGS: none performed  COGNITION: Overall cognitive status: Within functional limits for tasks assessed     SENSATION: WFL POSTURE: rounded shoulders and forward head  PALPATION: Left  knee and ankle are swollen and tender, left upper and neck  LOWER EXTREMITY ROM:  Active ROM Left eval Left 05/15/24  Hip flexion    Hip extension    Hip abduction    Hip adduction    Hip internal rotation    Hip external rotation    Knee flexion 90 91  Knee extension 0 0  Ankle dorsiflexion 5   Ankle plantarflexion 30   Ankle inversion 5   Ankle eversion 0    (Blank rows = not tested)  LOWER EXTREMITY MMT:  MMT Right eval Left eval  Hip flexion 3+ 3+  Hip extension    Hip abduction 4- 3+  Hip adduction 4- 3+  Hip internal rotation    Hip external rotation    Knee flexion 4- 3+  Knee extension 4- 3+  Ankle dorsiflexion  3+  Ankle plantarflexion    Ankle inversion    Ankle eversion     (Blank rows = not tested)  FUNCTIONAL TESTS:  Transfers Max A, left knee gives out and goes into  ER and tends to bow into varus, has not walked since I discharged her 12/27/23.    GAIT: Distance walked: with HHA and w/c follow x 40 feet with pain in the left knee a 7/10, she tends to rotate the left hip posterior and really bring the left knee into extension   TODAY'S TREATMENT:                                                                                                                              DATE:  05/22/24 Nustep level 5 x 8 minutes Standing weight shifts 2.5# marches 2.5# LAQ Red tband ankle PF/DF Yellow tband left hip adduction Red tband HS curls STM to the upper traps, neck and rhomboids Gait with HHA and W/C follow x 90 feet  05/20/24 Nustep level 5 x 8 minutes Red tband hip adduction and abduction, cues needed for form and to decreased the compensation of the left shoulder as she tends to elevate the left shoulder with left leg motions 2.5# marches 2.5# LAQ Yellow tband left ankle PF/DF and did some eversion STM to the left neck and upper trap and some into the left rhomboid Gait with HHA and W/C follow  30 feet and stopped due to left knee pain, then PT with  some passive ROM and approximation and we walked another 70 feet, cues to keep foot narrow   05/15/24 Nustep level 5 x 8 minutes Manual resistance clamshells with a lot of cues to do the left hip Red tband HS curls Ball b/n knees squeeze 2.5# LAQ 2.5# marches STM to bilat UT and cervical spine some with thera-gun Approximation to L digits   Yellow tband ankle PF/DF Gait HHA with w/c follow 90 feet, stopped due to shortness of breath  05/13/24 Nustep level 5 x 8 minutes Red tband clamshells with a lot of cues to do the left hip Ball b/n knees squeeze Red tband left HS curls 2.5# LAQ 2.5# marches Yellow tband left hip adduction Standing weight shifts Gait HHA with w/c follow 90 feet, stopped due to shortness of breath Yellow tband ankle PF/DF  05/08/24 Nustep level 5 x 8 minutes STM to the left upper trap Passive left ankle stretch Red tband left ankle PF/DF Red tband bilateral HS curls 2.5# marches 2.5# LAQ Standing weight shifts Gait with HHA and w/c follow x75 feet   05/05/24 Nustep level 5 x 8 minutes    PATIENT EDUCATION:  Education details: POC Person educated: Patient Education method: Explanation Education comprehension: verbalized understanding  HOME EXERCISE PROGRAM: TBD  ASSESSMENT:  CLINICAL IMPRESSION:   Needs cues to decreased the left shoulder elevation and synergystic motions with left LE motions and exercises.  Had less left knee pain with walking today,   Still with some medial knee pain and reported it stinging.  Able to walk a little farther but the limitation today was breathing. Patient is a 83 y.o. female who was seen today  for physical therapy evaluation and treatment for debility,    she has not been able to walk or transfer or exercise much since D/C from PT in May.  I have seen her off and on over the past 20+ years and she is having a lot of difficulty controlling the left knee, it buckles and goes into varus and does some  hyperextension, with walking 40 feet today she reported knee pain a 7/10,    OBJECTIVE IMPAIRMENTS: Abnormal gait, cardiopulmonary status limiting activity, decreased activity tolerance, decreased balance, decreased coordination, decreased endurance, decreased mobility, difficulty walking, decreased ROM, decreased strength, increased edema, impaired flexibility, improper body mechanics, postural dysfunction, and pain.   REHAB POTENTIAL: Good  CLINICAL DECISION MAKING: Stable/uncomplicated  EVALUATION COMPLEXITY: Low   GOALS: Goals reviewed with patient? Yes  SHORT TERM GOALS: Target date: 05/28/24 Independent with advanced HEP Goal status: progressing as she has one caregive that can do this 05/20/24  LONG TERM GOALS: Target date: 08/04/24  Independent with advanced HEP with caregiver Goal status: INITIAL  2.  Transfer with set up and CGA Goal status: ongoing 05/15/24  3.  Walk HHA x 150 feet Goal status: Progressing 05/22/24  4.  Increase left LE strength to 4/5 Goal status: INITIAL  5.  Report neck and shoulder pain decreased 50% Goal status: ongoing 05/22/24  PLAN:  PT FREQUENCY: 2x/week  PT DURATION: 12 weeks  PLANNED INTERVENTIONS: Therapeutic exercises, Therapeutic activity, Neuromuscular re-education, Balance training, Gait training, Patient/Family education, Self Care, Joint mobilization, Moist heat, Taping, and Manual therapy  PLAN FOR NEXT SESSION: advance activities really work on stability of the left knee   Kyle Stansell W, PT 05/22/2024, 11:06 AM

## 2024-05-27 ENCOUNTER — Encounter: Payer: Self-pay | Admitting: Physical Therapy

## 2024-05-27 ENCOUNTER — Ambulatory Visit: Admitting: Physical Therapy

## 2024-05-27 DIAGNOSIS — M542 Cervicalgia: Secondary | ICD-10-CM

## 2024-05-27 DIAGNOSIS — R2681 Unsteadiness on feet: Secondary | ICD-10-CM | POA: Diagnosis not present

## 2024-05-27 DIAGNOSIS — M25512 Pain in left shoulder: Secondary | ICD-10-CM | POA: Diagnosis not present

## 2024-05-27 DIAGNOSIS — M25572 Pain in left ankle and joints of left foot: Secondary | ICD-10-CM

## 2024-05-27 DIAGNOSIS — I6930 Unspecified sequelae of cerebral infarction: Secondary | ICD-10-CM

## 2024-05-27 DIAGNOSIS — M6281 Muscle weakness (generalized): Secondary | ICD-10-CM

## 2024-05-27 DIAGNOSIS — R270 Ataxia, unspecified: Secondary | ICD-10-CM

## 2024-05-27 DIAGNOSIS — R262 Difficulty in walking, not elsewhere classified: Secondary | ICD-10-CM

## 2024-05-27 DIAGNOSIS — I69354 Hemiplegia and hemiparesis following cerebral infarction affecting left non-dominant side: Secondary | ICD-10-CM | POA: Diagnosis not present

## 2024-05-27 NOTE — Therapy (Signed)
 OUTPATIENT PHYSICAL THERAPY LOWER EXTREMITY TREATMENT   Patient Name: Karen Jackson MRN: [id number] DOB:25-Jan-1941, 83 y.o., female Today's Date: 05/27/2024  END OF SESSION:  PT End of Session - 05/27/24 1106     Visit Number 7    Date for Recertification  08/04/24    Authorization Type Medicare    PT Start Time 1058    PT Stop Time 1144    PT Time Calculation (min) 46 min    Activity Tolerance Patient tolerated treatment well    Behavior During Therapy [facility] for tasks assessed/performed           Past Medical History:  Diagnosis Date   Acute cystitis without hematuria    Acute diastolic CHF (congestive heart failure) (HCC)    Arthritis    Dyspnea    Dysrhythmia    Fever of unknown origin 03/19/2017   Hyperlipidemia    Hypertension    denies at preop   Multifocal pneumonia    Neuromuscular disorder (HCC)    neuropathy left arm and foot   Osteopenia    Paralysis (HCC)    partial left side from CVA    Persistent atrial fibrillation (HCC)    PONV (postoperative nausea and vomiting)    Pre-diabetes    Stroke [facility]) 2013   hemmorahgic   Past Surgical History:  Procedure Laterality Date   ANKLE SURGERY     APPENDECTOMY     CHOLECYSTECTOMY     HARDWARE REMOVAL Left 03/29/2023   Procedure: HARDWARE REMOVAL;  Surgeon: Kit Rush, MD;  Location: [facility];  Service: Orthopedics;  Laterality: Left;   HERNIA REPAIR     Esophagus   INCISION AND DRAINAGE OF WOUND Left 03/29/2023   Procedure: LEFT ANKLE WOUND IRRIGATION AND DEBRIDEMENT WOUND;  Surgeon: Kit Rush, MD;  Location: [facility];  Service: Orthopedics;  Laterality: Left;   JOINT REPLACEMENT     total- right partial- left   MASTECTOMY PARTIAL / LUMPECTOMY  2012   left   ORIF ANKLE FRACTURE Left 07/20/2018   Procedure: OPEN REDUCTION INTERNAL FIXATION (ORIF) ANKLE FRACTURE;  Surgeon: Kit Rush, MD;  Location: [facility];  Service: Orthopedics;  Laterality: Left;   TOTAL KNEE  ARTHROPLASTY Left 01/27/2019   Procedure: TOTAL KNEE ARTHROPLASTY;  Surgeon: Rubie Kemps, MD;  Location: [facility];  Service: Orthopedics;  Laterality: Left;   Patient Active Problem List   Diagnosis Date Noted   Goals of care, counseling/discussion 01/17/2022   Grief 01/17/2022   Secondary hypercoagulable state 03/16/2021   Dizziness 03/10/2021   Orthostatic hypotension 10/15/2020   Presbycusis of both ears 03/08/2020   Mixed stress and urge urinary incontinence 12/02/2019   Macrocytosis 12/01/2019   Nutritional anemia 12/01/2019   S/P total knee replacement 01/27/2019   Recurrent left knee instability 07/05/2018   Respiratory failure with hypoxia (HCC) 08/30/2017   Hypoxemia    Heart failure with preserved ejection fraction (HCC), Grade 3 diastolic dysfunction 03/26/2017   PAF (paroxysmal atrial fibrillation) (HCC)    Dyspnea 03/19/2017   Encounter for preventive health examination 02/17/2016   Sensorineural hearing loss (SNHL), bilateral 01/26/2016   Hypomagnesemia 04/24/2014   Hemiparesis affecting left side as late effect of cerebrovascular accident (HCC) 04/24/2014   Nontraumatic cerebral hemorrhage (HCC) 04/30/2012   DM (diabetes mellitus) with complications (HCC) 03/04/2010   OSTEOPENIA 01/21/2009   UNSPECIFIED VITAMIN D  DEFICIENCY 11/19/2007   HYPERCHOLESTEROLEMIA 10/25/2006   GASTROESOPHAGEAL REFLUX, NO ESOPHAGITIS 10/25/2006   DIVERTICULOSIS OF COLON 10/25/2006  Osteoarthritis 10/25/2006   CERVICAL SPINE DISORDER, NOS 10/25/2006    PCP: Shayne, MD  REFERRING PROVIDER: Shayne, MD  REFERRING DIAG: debility  THERAPY DIAG:  Muscle weakness (generalized)  Hemiplegia and hemiparesis following cerebral infarction affecting left non-dominant side (HCC)  Unsteadiness on feet  Left shoulder pain, unspecified chronicity  Difficulty in walking, not elsewhere classified  Ataxia  Cervicalgia  Pain in left ankle and joints of left foot  Late effects of CVA  (cerebrovascular accident)  Rationale for Evaluation and Treatment: Rehabilitation  ONSET DATE: 04/29/24  SUBJECTIVE:   SUBJECTIVE STATEMENT: Foot is still a little tender from where they cut my nails.  I transferred well this weekend  Patient has been a patient of mine off and on for many years, she has had many different diagnoses, she had a CVA about 13 years ago, she had a left ankle fracture with ORIF, she had a lot of issues with pain and wounds, so the hardware was removed last August, she has done pretty well but tends to back slide without PT with her ability to walk and transfer.  She has a caregiver about 11 hours a day.  She has been able to walk with me in PT, no one else has walked with her   PERTINENT HISTORY: CVA, CHF, HTN, A-fib, TKA PAIN:  Are you having pain? Yes: NPRS scale: 5-6/10 Pain location: left upper trap and neck, left ankle and knee  Pain description: ache sore Aggravating factors: neck and shoulder always hurt, knee and ankle hurt with transfers Relieving factors: massage, heat  PRECAUTIONS: None  RED FLAGS: None   WEIGHT BEARING RESTRICTIONS: No  FALLS:  Has patient fallen in last 6 months? No  LIVING ENVIRONMENT: Lives with: lives alone Lives in: House/apartment Stairs: No Has following equipment at home: Environmental consultant - 2 wheeled, Wheelchair (manual), shower chair, bed side commode, Grab bars, and Ramped entry care giver 11 hours a day  OCCUPATION: retired  PLOF: Needs assistance with homemaking, Needs assistance with transfers, and Leisure: plays bridge, she has 11 hours of an aide at home that helps with meals, going to MD's, dressing and bathing, is alone at night  PATIENT GOALS: walk, have less pain, transfer without difficulty  NEXT MD VISIT: none scheduled  OBJECTIVE:   DIAGNOSTIC FINDINGS: none performed  COGNITION: Overall cognitive status: Within functional limits for tasks assessed     SENSATION: WFL POSTURE: rounded shoulders  and forward head  PALPATION: Left knee and ankle are swollen and tender, left upper and neck  LOWER EXTREMITY ROM:  Active ROM Left eval Left 05/15/24  Hip flexion    Hip extension    Hip abduction    Hip adduction    Hip internal rotation    Hip external rotation    Knee flexion 90 91  Knee extension 0 0  Ankle dorsiflexion 5   Ankle plantarflexion 30   Ankle inversion 5   Ankle eversion 0    (Blank rows = not tested)  LOWER EXTREMITY MMT:  MMT Right eval Left eval  Hip flexion 3+ 3+  Hip extension    Hip abduction 4- 3+  Hip adduction 4- 3+  Hip internal rotation    Hip external rotation    Knee flexion 4- 3+  Knee extension 4- 3+  Ankle dorsiflexion  3+  Ankle plantarflexion    Ankle inversion    Ankle eversion     (Blank rows = not tested)  FUNCTIONAL TESTS:  Transfers Max A,  left knee gives out and goes into ER and tends to bow into varus, has not walked since I discharged her 12/27/23.    GAIT: Distance walked: with HHA and w/c follow x 40 feet with pain in the left knee a 7/10, she tends to rotate the left hip posterior and really bring the left knee into extension   TODAY'S TREATMENT:                                                                                                                              DATE:  05/27/24 Nustep level 5 x 8 minutes Standing weight shifts STM to the left upper trap, rhomboid and neck 3# LAQ 3# marches Blue tband HS curls some medial left knee pain Yellow tband left hip adduction Green tband clamshells with cues for the left hip Gait with HHA and w/c follow x80 feet, needs cues to keep the left foot in   05/22/24 Nustep level 5 x 8 minutes Standing weight shifts 2.5# marches 2.5# LAQ Red tband ankle PF/DF Yellow tband left hip adduction Red tband HS curls STM to the upper traps, neck and rhomboids Gait with HHA and W/C follow x 90 feet  05/20/24 Nustep level 5 x 8 minutes Red tband hip adduction and  abduction, cues needed for form and to decreased the compensation of the left shoulder as she tends to elevate the left shoulder with left leg motions 2.5# marches 2.5# LAQ Yellow tband left ankle PF/DF and did some eversion STM to the left neck and upper trap and some into the left rhomboid Gait with HHA and W/C follow  30 feet and stopped due to left knee pain, then PT with some passive ROM and approximation and we walked another 70 feet, cues to keep foot narrow   05/15/24 Nustep level 5 x 8 minutes Manual resistance clamshells with a lot of cues to do the left hip Red tband HS curls Ball b/n knees squeeze 2.5# LAQ 2.5# marches STM to bilat UT and cervical spine some with thera-gun Approximation to L digits   Yellow tband ankle PF/DF Gait HHA with w/c follow 90 feet, stopped due to shortness of breath  05/13/24 Nustep level 5 x 8 minutes Red tband clamshells with a lot of cues to do the left hip Ball b/n knees squeeze Red tband left HS curls 2.5# LAQ 2.5# marches Yellow tband left hip adduction Standing weight shifts Gait HHA with w/c follow 90 feet, stopped due to shortness of breath Yellow tband ankle PF/DF  05/08/24 Nustep level 5 x 8 minutes STM to the left upper trap Passive left ankle stretch Red tband left ankle PF/DF Red tband bilateral HS curls 2.5# marches 2.5# LAQ Standing weight shifts Gait with HHA and w/c follow x75 feet   05/05/24 Nustep level 5 x 8 minutes    PATIENT EDUCATION:  Education details: POC Person educated: Patient Education method: Explanation Education comprehension: verbalized understanding  HOME  EXERCISE PROGRAM: TBD  ASSESSMENT:  CLINICAL IMPRESSION:   Needs cues to decreased the left shoulder elevation and synergystic motions with left LE motions and exercises. Cues need to keep left foot in as when she puts it out she tends to get the left medial knee pain.  She does need cues to help with the left knee staying in and not  flailing out. Patient is a 83 y.o. female who was seen today for physical therapy evaluation and treatment for debility,    she has not been able to walk or transfer or exercise much since D/C from PT in May.  I have seen her off and on over the past 20+ years and she is having a lot of difficulty controlling the left knee, it buckles and goes into varus and does some hyperextension, with walking 40 feet today she reported knee pain a 7/10,    OBJECTIVE IMPAIRMENTS: Abnormal gait, cardiopulmonary status limiting activity, decreased activity tolerance, decreased balance, decreased coordination, decreased endurance, decreased mobility, difficulty walking, decreased ROM, decreased strength, increased edema, impaired flexibility, improper body mechanics, postural dysfunction, and pain.   REHAB POTENTIAL: Good  CLINICAL DECISION MAKING: Stable/uncomplicated  EVALUATION COMPLEXITY: Low   GOALS: Goals reviewed with patient? Yes  SHORT TERM GOALS: Target date: 05/28/24 Independent with advanced HEP Goal status: progressing as she has one caregive that can do this 05/20/24  LONG TERM GOALS: Target date: 08/04/24  Independent with advanced HEP with caregiver Goal status: INITIAL  2.  Transfer with set up and CGA Goal status: ongoing 05/15/24  3.  Walk HHA x 150 feet Goal status: Progressing 05/22/24  4.  Increase left LE strength to 4/5 Goal status: INITIAL  5.  Report neck and shoulder pain decreased 50% Goal status: ongoing 05/22/24  PLAN:  PT FREQUENCY: 2x/week  PT DURATION: 12 weeks  PLANNED INTERVENTIONS: Therapeutic exercises, Therapeutic activity, Neuromuscular re-education, Balance training, Gait training, Patient/Family education, Self Care, Joint mobilization, Moist heat, Taping, and Manual therapy  PLAN FOR NEXT SESSION: advance activities really work on stability of the left knee   Pelagia Iacobucci W, PT 05/27/2024, 11:07 AM

## 2024-05-28 ENCOUNTER — Encounter: Payer: Self-pay | Admitting: Podiatry

## 2024-05-28 ENCOUNTER — Encounter: Payer: Self-pay | Admitting: Physical Therapy

## 2024-05-29 ENCOUNTER — Telehealth: Payer: Self-pay | Admitting: Podiatry

## 2024-05-29 ENCOUNTER — Ambulatory Visit: Attending: Internal Medicine | Admitting: Physical Therapy

## 2024-05-29 ENCOUNTER — Encounter: Payer: Self-pay | Admitting: Physical Therapy

## 2024-05-29 DIAGNOSIS — R2681 Unsteadiness on feet: Secondary | ICD-10-CM | POA: Insufficient documentation

## 2024-05-29 DIAGNOSIS — I69354 Hemiplegia and hemiparesis following cerebral infarction affecting left non-dominant side: Secondary | ICD-10-CM | POA: Diagnosis not present

## 2024-05-29 DIAGNOSIS — R270 Ataxia, unspecified: Secondary | ICD-10-CM | POA: Insufficient documentation

## 2024-05-29 DIAGNOSIS — M25572 Pain in left ankle and joints of left foot: Secondary | ICD-10-CM | POA: Diagnosis present

## 2024-05-29 DIAGNOSIS — R262 Difficulty in walking, not elsewhere classified: Secondary | ICD-10-CM | POA: Insufficient documentation

## 2024-05-29 DIAGNOSIS — M6281 Muscle weakness (generalized): Secondary | ICD-10-CM | POA: Diagnosis not present

## 2024-05-29 DIAGNOSIS — M25512 Pain in left shoulder: Secondary | ICD-10-CM | POA: Insufficient documentation

## 2024-05-29 DIAGNOSIS — I6930 Unspecified sequelae of cerebral infarction: Secondary | ICD-10-CM | POA: Diagnosis not present

## 2024-05-29 DIAGNOSIS — M542 Cervicalgia: Secondary | ICD-10-CM | POA: Diagnosis not present

## 2024-05-29 DIAGNOSIS — R293 Abnormal posture: Secondary | ICD-10-CM | POA: Insufficient documentation

## 2024-05-29 NOTE — Therapy (Signed)
 OUTPATIENT PHYSICAL THERAPY LOWER EXTREMITY TREATMENT   Patient Name: Karen Jackson MRN: [id number] DOB:Apr 03, 1941, 83 y.o., female Today's Date: 05/29/2024  END OF SESSION:  PT End of Session - 05/29/24 1100     Visit Number 8    Date for Recertification  08/04/24    Authorization Type Medicare    PT Start Time 1057    PT Stop Time 1143    PT Time Calculation (min) 46 min    Activity Tolerance Patient tolerated treatment well    Behavior During Therapy [facility] for tasks assessed/performed           Past Medical History:  Diagnosis Date   Acute cystitis without hematuria    Acute diastolic CHF (congestive heart failure) (HCC)    Arthritis    Dyspnea    Dysrhythmia    Fever of unknown origin 03/19/2017   Hyperlipidemia    Hypertension    denies at preop   Multifocal pneumonia    Neuromuscular disorder (HCC)    neuropathy left arm and foot   Osteopenia    Paralysis (HCC)    partial left side from CVA    Persistent atrial fibrillation (HCC)    PONV (postoperative nausea and vomiting)    Pre-diabetes    Stroke [facility]) 2013   hemmorahgic   Past Surgical History:  Procedure Laterality Date   ANKLE SURGERY     APPENDECTOMY     CHOLECYSTECTOMY     HARDWARE REMOVAL Left 03/29/2023   Procedure: HARDWARE REMOVAL;  Surgeon: Kit Rush, MD;  Location: [facility];  Service: Orthopedics;  Laterality: Left;   HERNIA REPAIR     Esophagus   INCISION AND DRAINAGE OF WOUND Left 03/29/2023   Procedure: LEFT ANKLE WOUND IRRIGATION AND DEBRIDEMENT WOUND;  Surgeon: Kit Rush, MD;  Location: [facility];  Service: Orthopedics;  Laterality: Left;   JOINT REPLACEMENT     total- right partial- left   MASTECTOMY PARTIAL / LUMPECTOMY  2012   left   ORIF ANKLE FRACTURE Left 07/20/2018   Procedure: OPEN REDUCTION INTERNAL FIXATION (ORIF) ANKLE FRACTURE;  Surgeon: Kit Rush, MD;  Location: [facility];  Service: Orthopedics;  Laterality: Left;   TOTAL KNEE  ARTHROPLASTY Left 01/27/2019   Procedure: TOTAL KNEE ARTHROPLASTY;  Surgeon: Rubie Kemps, MD;  Location: [facility];  Service: Orthopedics;  Laterality: Left;   Patient Active Problem List   Diagnosis Date Noted   Goals of care, counseling/discussion 01/17/2022   Grief 01/17/2022   Secondary hypercoagulable state 03/16/2021   Dizziness 03/10/2021   Orthostatic hypotension 10/15/2020   Presbycusis of both ears 03/08/2020   Mixed stress and urge urinary incontinence 12/02/2019   Macrocytosis 12/01/2019   Nutritional anemia 12/01/2019   S/P total knee replacement 01/27/2019   Recurrent left knee instability 07/05/2018   Respiratory failure with hypoxia (HCC) 08/30/2017   Hypoxemia    Heart failure with preserved ejection fraction (HCC), Grade 3 diastolic dysfunction 03/26/2017   PAF (paroxysmal atrial fibrillation) (HCC)    Dyspnea 03/19/2017   Encounter for preventive health examination 02/17/2016   Sensorineural hearing loss (SNHL), bilateral 01/26/2016   Hypomagnesemia 04/24/2014   Hemiparesis affecting left side as late effect of cerebrovascular accident (HCC) 04/24/2014   Nontraumatic cerebral hemorrhage (HCC) 04/30/2012   DM (diabetes mellitus) with complications (HCC) 03/04/2010   OSTEOPENIA 01/21/2009   UNSPECIFIED VITAMIN D  DEFICIENCY 11/19/2007   HYPERCHOLESTEROLEMIA 10/25/2006   GASTROESOPHAGEAL REFLUX, NO ESOPHAGITIS 10/25/2006   DIVERTICULOSIS OF COLON 10/25/2006  Osteoarthritis 10/25/2006   CERVICAL SPINE DISORDER, NOS 10/25/2006    PCP: Shayne, MD  REFERRING PROVIDER: Shayne, MD  REFERRING DIAG: debility  THERAPY DIAG:  Muscle weakness (generalized)  Hemiplegia and hemiparesis following cerebral infarction affecting left non-dominant side (HCC)  Unsteadiness on feet  Left shoulder pain, unspecified chronicity  Difficulty in walking, not elsewhere classified  Ataxia  Cervicalgia  Pain in left ankle and joints of left foot  Late effects of CVA  (cerebrovascular accident)  Rationale for Evaluation and Treatment: Rehabilitation  ONSET DATE: 04/29/24  SUBJECTIVE:   SUBJECTIVE STATEMENT: Foot is still tender from where they cut my nails. HAs some soreness in the knee  Patient has been a patient of mine off and on for many years, she has had many different diagnoses, she had a CVA about 13 years ago, she had a left ankle fracture with ORIF, she had a lot of issues with pain and wounds, so the hardware was removed last August, she has done pretty well but tends to back slide without PT with her ability to walk and transfer.  She has a caregiver about 11 hours a day.  She has been able to walk with me in PT, no one else has walked with her   PERTINENT HISTORY: CVA, CHF, HTN, A-fib, TKA PAIN:  Are you having pain? Yes: NPRS scale: 5-6/10 Pain location: left upper trap and neck, left ankle and knee  Pain description: ache sore Aggravating factors: neck and shoulder always hurt, knee and ankle hurt with transfers Relieving factors: massage, heat  PRECAUTIONS: None  RED FLAGS: None   WEIGHT BEARING RESTRICTIONS: No  FALLS:  Has patient fallen in last 6 months? No  LIVING ENVIRONMENT: Lives with: lives alone Lives in: House/apartment Stairs: No Has following equipment at home: Environmental consultant - 2 wheeled, Wheelchair (manual), shower chair, bed side commode, Grab bars, and Ramped entry care giver 11 hours a day  OCCUPATION: retired  PLOF: Needs assistance with homemaking, Needs assistance with transfers, and Leisure: plays bridge, she has 11 hours of an aide at home that helps with meals, going to MD's, dressing and bathing, is alone at night  PATIENT GOALS: walk, have less pain, transfer without difficulty  NEXT MD VISIT: none scheduled  OBJECTIVE:   DIAGNOSTIC FINDINGS: none performed  COGNITION: Overall cognitive status: Within functional limits for tasks assessed     SENSATION: WFL POSTURE: rounded shoulders and forward  head  PALPATION: Left knee and ankle are swollen and tender, left upper and neck  LOWER EXTREMITY ROM:  Active ROM Left eval Left 05/15/24  Hip flexion    Hip extension    Hip abduction    Hip adduction    Hip internal rotation    Hip external rotation    Knee flexion 90 91  Knee extension 0 0  Ankle dorsiflexion 5   Ankle plantarflexion 30   Ankle inversion 5   Ankle eversion 0    (Blank rows = not tested)  LOWER EXTREMITY MMT:  MMT Right eval Left eval  Hip flexion 3+ 3+  Hip extension    Hip abduction 4- 3+  Hip adduction 4- 3+  Hip internal rotation    Hip external rotation    Knee flexion 4- 3+  Knee extension 4- 3+  Ankle dorsiflexion  3+  Ankle plantarflexion    Ankle inversion    Ankle eversion     (Blank rows = not tested)  FUNCTIONAL TESTS:  Transfers Max A, left knee  gives out and goes into ER and tends to bow into varus, has not walked since I discharged her 12/27/23.    GAIT: Distance walked: with HHA and w/c follow x 40 feet with pain in the left knee a 7/10, she tends to rotate the left hip posterior and really bring the left knee into extension   TODAY'S TREATMENT:                                                                                                                              DATE:  05/29/24 Nustep level 5 x 8 minutes STM to the left upper trap, neck and rhomboids Gait with HHA and w/c follow x 80 feet 3# marches 3# LAQ Red tband HS curls Red ankle PF/DF Yellow left hip abd/adduction some pain with adduction Gait as noted above again 60 feet, this time limited by fatigue and SOB   05/27/24 Nustep level 5 x 8 minutes Standing weight shifts STM to the left upper trap, rhomboid and neck 3# LAQ 3# marches Blue tband HS curls some medial left knee pain Yellow tband left hip adduction Green tband clamshells with cues for the left hip Gait with HHA and w/c follow x80 feet, needs cues to keep the left foot in   05/22/24 Nustep  level 5 x 8 minutes Standing weight shifts 2.5# marches 2.5# LAQ Red tband ankle PF/DF Yellow tband left hip adduction Red tband HS curls STM to the upper traps, neck and rhomboids Gait with HHA and W/C follow x 90 feet  05/20/24 Nustep level 5 x 8 minutes Red tband hip adduction and abduction, cues needed for form and to decreased the compensation of the left shoulder as she tends to elevate the left shoulder with left leg motions 2.5# marches 2.5# LAQ Yellow tband left ankle PF/DF and did some eversion STM to the left neck and upper trap and some into the left rhomboid Gait with HHA and W/C follow  30 feet and stopped due to left knee pain, then PT with some passive ROM and approximation and we walked another 70 feet, cues to keep foot narrow   05/15/24 Nustep level 5 x 8 minutes Manual resistance clamshells with a lot of cues to do the left hip Red tband HS curls Ball b/n knees squeeze 2.5# LAQ 2.5# marches STM to bilat UT and cervical spine some with thera-gun Approximation to L digits   Yellow tband ankle PF/DF Gait HHA with w/c follow 90 feet, stopped due to shortness of breath  05/13/24 Nustep level 5 x 8 minutes Red tband clamshells with a lot of cues to do the left hip Ball b/n knees squeeze Red tband left HS curls 2.5# LAQ 2.5# marches Yellow tband left hip adduction Standing weight shifts Gait HHA with w/c follow 90 feet, stopped due to shortness of breath Yellow tband ankle PF/DF  05/08/24 Nustep level 5 x 8 minutes STM to the left upper trap Passive  left ankle stretch Red tband left ankle PF/DF Red tband bilateral HS curls 2.5# marches 2.5# LAQ Standing weight shifts Gait with HHA and w/c follow x75 feet   05/05/24 Nustep level 5 x 8 minutes    PATIENT EDUCATION:  Education details: POC Person educated: Patient Education method: Explanation Education comprehension: verbalized understanding  HOME EXERCISE  PROGRAM: TBD  ASSESSMENT:  CLINICAL IMPRESSION:  Cues needed to keep left foot in as when she puts it out she tends to get the left medial knee pain.  We attempted two walks today with her doing well the first one of 80 feet with mild c/o soreness in the left knee, the second walk was 60 feet with limitation being SOB and fatigue.  We will need to continue to work on knee stability to help with this Patient is a 83 y.o. female who was seen today for physical therapy evaluation and treatment for debility,    she has not been able to walk or transfer or exercise much since D/C from PT in May.  I have seen her off and on over the past 20+ years and she is having a lot of difficulty controlling the left knee, it buckles and goes into varus and does some hyperextension, with walking 40 feet today she reported knee pain a 7/10,    OBJECTIVE IMPAIRMENTS: Abnormal gait, cardiopulmonary status limiting activity, decreased activity tolerance, decreased balance, decreased coordination, decreased endurance, decreased mobility, difficulty walking, decreased ROM, decreased strength, increased edema, impaired flexibility, improper body mechanics, postural dysfunction, and pain.   REHAB POTENTIAL: Good  CLINICAL DECISION MAKING: Stable/uncomplicated  EVALUATION COMPLEXITY: Low   GOALS: Goals reviewed with patient? Yes  SHORT TERM GOALS: Target date: 05/28/24 Independent with advanced HEP Goal status: progressing as she has one caregive that can do this 05/20/24  LONG TERM GOALS: Target date: 08/04/24  Independent with advanced HEP with caregiver Goal status: ongoing 05/29/24  2.  Transfer with set up and CGA Goal status: ongoing 05/15/24  3.  Walk HHA x 150 feet Goal status: Progressing 05/22/24  4.  Increase left LE strength to 4/5 Goal status: ongoing 05/29/24  5.  Report neck and shoulder pain decreased 50% Goal status: ongoing 05/22/24  PLAN:  PT FREQUENCY: 2x/week  PT DURATION: 12  weeks  PLANNED INTERVENTIONS: Therapeutic exercises, Therapeutic activity, Neuromuscular re-education, Balance training, Gait training, Patient/Family education, Self Care, Joint mobilization, Moist heat, Taping, and Manual therapy  PLAN FOR NEXT SESSION: advance activities really work on stability of the left knee   Dorwin Fitzhenry W, PT 05/29/2024, 11:01 AM

## 2024-05-29 NOTE — Telephone Encounter (Signed)
 Patient stated she does not want to schedule with any provider, she has already scheduled with Dr. Gaynel for RFC

## 2024-06-02 ENCOUNTER — Other Ambulatory Visit (HOSPITAL_COMMUNITY): Payer: Self-pay | Admitting: Internal Medicine

## 2024-06-02 DIAGNOSIS — M810 Age-related osteoporosis without current pathological fracture: Secondary | ICD-10-CM | POA: Insufficient documentation

## 2024-06-03 ENCOUNTER — Telehealth (HOSPITAL_COMMUNITY): Payer: Self-pay | Admitting: Pharmacy Technician

## 2024-06-03 ENCOUNTER — Ambulatory Visit: Admitting: Physical Therapy

## 2024-06-03 ENCOUNTER — Encounter: Payer: Self-pay | Admitting: Physical Therapy

## 2024-06-03 DIAGNOSIS — R2681 Unsteadiness on feet: Secondary | ICD-10-CM

## 2024-06-03 DIAGNOSIS — R262 Difficulty in walking, not elsewhere classified: Secondary | ICD-10-CM | POA: Diagnosis not present

## 2024-06-03 DIAGNOSIS — I69354 Hemiplegia and hemiparesis following cerebral infarction affecting left non-dominant side: Secondary | ICD-10-CM

## 2024-06-03 DIAGNOSIS — M25572 Pain in left ankle and joints of left foot: Secondary | ICD-10-CM

## 2024-06-03 DIAGNOSIS — M542 Cervicalgia: Secondary | ICD-10-CM

## 2024-06-03 DIAGNOSIS — M25512 Pain in left shoulder: Secondary | ICD-10-CM | POA: Diagnosis not present

## 2024-06-03 DIAGNOSIS — M6281 Muscle weakness (generalized): Secondary | ICD-10-CM | POA: Diagnosis not present

## 2024-06-03 DIAGNOSIS — R270 Ataxia, unspecified: Secondary | ICD-10-CM

## 2024-06-03 DIAGNOSIS — I6930 Unspecified sequelae of cerebral infarction: Secondary | ICD-10-CM

## 2024-06-03 DIAGNOSIS — R293 Abnormal posture: Secondary | ICD-10-CM

## 2024-06-03 NOTE — Therapy (Signed)
 OUTPATIENT PHYSICAL THERAPY LOWER EXTREMITY TREATMENT   Patient Name: Karen Jackson MRN: [id number] DOB:[birth year], 83 y.o., female Today's Date: 06/03/2024  END OF SESSION:  PT End of Session - 06/03/24 1021     Visit Number 9    Date for Recertification  08/04/24    Authorization Type Medicare    PT Start Time 1015    PT Stop Time 1100    PT Time Calculation (min) 45 min    Activity Tolerance Patient tolerated treatment well    Behavior During Therapy [facility] for tasks assessed/performed           Past Medical History:  Diagnosis Date   Acute cystitis without hematuria    Acute diastolic CHF (congestive heart failure) (HCC)    Arthritis    Dyspnea    Dysrhythmia    Fever of unknown origin 03/19/2017   Hyperlipidemia    Hypertension    denies at preop   Multifocal pneumonia    Neuromuscular disorder (HCC)    neuropathy left arm and foot   Osteopenia    Paralysis (HCC)    partial left side from CVA    Persistent atrial fibrillation (HCC)    PONV (postoperative nausea and vomiting)    Pre-diabetes    Stroke [facility]) 2013   hemmorahgic   Past Surgical History:  Procedure Laterality Date   ANKLE SURGERY     APPENDECTOMY     CHOLECYSTECTOMY     HARDWARE REMOVAL Left 03/29/2023   Procedure: HARDWARE REMOVAL;  Surgeon: Kit Rush, MD;  Location: [facility];  Service: Orthopedics;  Laterality: Left;   HERNIA REPAIR     Esophagus   INCISION AND DRAINAGE OF WOUND Left 03/29/2023   Procedure: LEFT ANKLE WOUND IRRIGATION AND DEBRIDEMENT WOUND;  Surgeon: Kit Rush, MD;  Location: [facility];  Service: Orthopedics;  Laterality: Left;   JOINT REPLACEMENT     total- right partial- left   MASTECTOMY PARTIAL / LUMPECTOMY  2012   left   ORIF ANKLE FRACTURE Left 07/20/2018   Procedure: OPEN REDUCTION INTERNAL FIXATION (ORIF) ANKLE FRACTURE;  Surgeon: Kit Rush, MD;  Location: [facility];  Service: Orthopedics;  Laterality: Left;   TOTAL KNEE  ARTHROPLASTY Left 01/27/2019   Procedure: TOTAL KNEE ARTHROPLASTY;  Surgeon: Rubie Kemps, MD;  Location: [facility];  Service: Orthopedics;  Laterality: Left;   Patient Active Problem List   Diagnosis Date Noted   Senile osteoporosis 06/02/2024   Goals of care, counseling/discussion 01/17/2022   Grief 01/17/2022   Secondary hypercoagulable state 03/16/2021   Dizziness 03/10/2021   Orthostatic hypotension 10/15/2020   Presbycusis of both ears 03/08/2020   Mixed stress and urge urinary incontinence 12/02/2019   Macrocytosis 12/01/2019   Nutritional anemia 12/01/2019   S/P total knee replacement 01/27/2019   Recurrent left knee instability 07/05/2018   Respiratory failure with hypoxia (HCC) 08/30/2017   Hypoxemia    Heart failure with preserved ejection fraction (HCC), Grade 3 diastolic dysfunction 03/26/2017   PAF (paroxysmal atrial fibrillation) (HCC)    Dyspnea 03/19/2017   Encounter for preventive health examination 02/17/2016   Sensorineural hearing loss (SNHL), bilateral 01/26/2016   Hypomagnesemia 04/24/2014   Hemiparesis affecting left side as late effect of cerebrovascular accident (HCC) 04/24/2014   Nontraumatic cerebral hemorrhage (HCC) 04/30/2012   DM (diabetes mellitus) with complications (HCC) 03/04/2010   OSTEOPENIA 01/21/2009   UNSPECIFIED VITAMIN D  DEFICIENCY 11/19/2007   HYPERCHOLESTEROLEMIA 10/25/2006   GASTROESOPHAGEAL REFLUX, NO ESOPHAGITIS 10/25/2006   DIVERTICULOSIS  OF COLON 10/25/2006   Osteoarthritis 10/25/2006   CERVICAL SPINE DISORDER, NOS 10/25/2006    PCP: Shayne, MD  REFERRING PROVIDER: Shayne, MD  REFERRING DIAG: debility  THERAPY DIAG:  Muscle weakness (generalized)  Hemiplegia and hemiparesis following cerebral infarction affecting left non-dominant side (HCC)  Unsteadiness on feet  Left shoulder pain, unspecified chronicity  Difficulty in walking, not elsewhere classified  Ataxia  Cervicalgia  Pain in left ankle and joints of left  foot  Late effects of CVA (cerebrovascular accident)  Abnormal posture  Rationale for Evaluation and Treatment: Rehabilitation  ONSET DATE: 04/29/24  SUBJECTIVE:   SUBJECTIVE STATEMENT: Doing okay, knee is sore  Patient has been a patient of mine off and on for many years, she has had many different diagnoses, she had a CVA about 13 years ago, she had a left ankle fracture with ORIF, she had a lot of issues with pain and wounds, so the hardware was removed last August, she has done pretty well but tends to back slide without PT with her ability to walk and transfer.  She has a caregiver about 11 hours a day.  She has been able to walk with me in PT, no one else has walked with her   PERTINENT HISTORY: CVA, CHF, HTN, A-fib, TKA PAIN:  Are you having pain? Yes: NPRS scale: 5-6/10 Pain location: left upper trap and neck, left ankle and knee  Pain description: ache sore Aggravating factors: neck and shoulder always hurt, knee and ankle hurt with transfers Relieving factors: massage, heat  PRECAUTIONS: None  RED FLAGS: None   WEIGHT BEARING RESTRICTIONS: No  FALLS:  Has patient fallen in last 6 months? No  LIVING ENVIRONMENT: Lives with: lives alone Lives in: House/apartment Stairs: No Has following equipment at home: Environmental consultant - 2 wheeled, Wheelchair (manual), shower chair, bed side commode, Grab bars, and Ramped entry care giver 11 hours a day  OCCUPATION: retired  PLOF: Needs assistance with homemaking, Needs assistance with transfers, and Leisure: plays bridge, she has 11 hours of an aide at home that helps with meals, going to MD's, dressing and bathing, is alone at night  PATIENT GOALS: walk, have less pain, transfer without difficulty  NEXT MD VISIT: none scheduled  OBJECTIVE:   DIAGNOSTIC FINDINGS: none performed  COGNITION: Overall cognitive status: Within functional limits for tasks assessed     SENSATION: WFL POSTURE: rounded shoulders and forward  head  PALPATION: Left knee and ankle are swollen and tender, left upper and neck  LOWER EXTREMITY ROM:  Active ROM Left eval Left 05/15/24  Hip flexion    Hip extension    Hip abduction    Hip adduction    Hip internal rotation    Hip external rotation    Knee flexion 90 91  Knee extension 0 0  Ankle dorsiflexion 5   Ankle plantarflexion 30   Ankle inversion 5   Ankle eversion 0    (Blank rows = not tested)  LOWER EXTREMITY MMT:  MMT Right eval Left eval  Hip flexion 3+ 3+  Hip extension    Hip abduction 4- 3+  Hip adduction 4- 3+  Hip internal rotation    Hip external rotation    Knee flexion 4- 3+  Knee extension 4- 3+  Ankle dorsiflexion  3+  Ankle plantarflexion    Ankle inversion    Ankle eversion     (Blank rows = not tested)  FUNCTIONAL TESTS:  Transfers Max A, left knee gives out and  goes into ER and tends to bow into varus, has not walked since I discharged her 12/27/23.    GAIT: Distance walked: with HHA and w/c follow x 40 feet with pain in the left knee a 7/10, she tends to rotate the left hip posterior and really bring the left knee into extension   TODAY'S TREATMENT:                                                                                                                              DATE:  06/03/24 Nustep level 5 x 8 minutes Gait HHA with W/C follow 80 feet 3# marches 3# LAQ Red tband HS curls Red tband left hip adduction Red tband left hip abduction Red tband left ankle PF/DF Standing weight shifts Gait HHA with w/c follow 80 feet  05/29/24 Nustep level 5 x 8 minutes STM to the left upper trap, neck and rhomboids Gait with HHA and w/c follow x 80 feet 3# marches 3# LAQ Red tband HS curls Red ankle PF/DF Yellow left hip abd/adduction some pain with adduction Gait as noted above again 60 feet, this time limited by fatigue and SOB   05/27/24 Nustep level 5 x 8 minutes Standing weight shifts STM to the left upper trap, rhomboid  and neck 3# LAQ 3# marches Blue tband HS curls some medial left knee pain Yellow tband left hip adduction Green tband clamshells with cues for the left hip Gait with HHA and w/c follow x80 feet, needs cues to keep the left foot in   05/22/24 Nustep level 5 x 8 minutes Standing weight shifts 2.5# marches 2.5# LAQ Red tband ankle PF/DF Yellow tband left hip adduction Red tband HS curls STM to the upper traps, neck and rhomboids Gait with HHA and W/C follow x 90 feet  05/20/24 Nustep level 5 x 8 minutes Red tband hip adduction and abduction, cues needed for form and to decreased the compensation of the left shoulder as she tends to elevate the left shoulder with left leg motions 2.5# marches 2.5# LAQ Yellow tband left ankle PF/DF and did some eversion STM to the left neck and upper trap and some into the left rhomboid Gait with HHA and W/C follow  30 feet and stopped due to left knee pain, then PT with some passive ROM and approximation and we walked another 70 feet, cues to keep foot narrow   05/15/24 Nustep level 5 x 8 minutes Manual resistance clamshells with a lot of cues to do the left hip Red tband HS curls Ball b/n knees squeeze 2.5# LAQ 2.5# marches STM to bilat UT and cervical spine some with thera-gun Approximation to L digits   Yellow tband ankle PF/DF Gait HHA with w/c follow 90 feet, stopped due to shortness of breath  05/13/24 Nustep level 5 x 8 minutes Red tband clamshells with a lot of cues to do the left hip Ball b/n knees squeeze Red tband left HS  curls 2.5# LAQ 2.5# marches Yellow tband left hip adduction Standing weight shifts Gait HHA with w/c follow 90 feet, stopped due to shortness of breath Yellow tband ankle PF/DF  05/08/24 Nustep level 5 x 8 minutes STM to the left upper trap Passive left ankle stretch Red tband left ankle PF/DF Red tband bilateral HS curls 2.5# marches 2.5# LAQ Standing weight shifts Gait with HHA and w/c follow x75  feet   05/05/24 Nustep level 5 x 8 minutes    PATIENT EDUCATION:  Education details: POC Person educated: Patient Education method: Explanation Education comprehension: verbalized understanding  HOME EXERCISE PROGRAM: TBD  ASSESSMENT:  CLINICAL IMPRESSION:  Cues needed to keep left foot in as when she puts it out she tends to get the left medial knee pain.  We were able to do 80 feet twice today but both times she stopped due to fatigue, she does sound congested in her lungs, she reports that she has started a new medication Patient is a 84 y.o. female who was seen today for physical therapy evaluation and treatment for debility,    she has not been able to walk or transfer or exercise much since D/C from PT in May.  I have seen her off and on over the past 20+ years and she is having a lot of difficulty controlling the left knee, it buckles and goes into varus and does some hyperextension, with walking 40 feet today she reported knee pain a 7/10,    OBJECTIVE IMPAIRMENTS: Abnormal gait, cardiopulmonary status limiting activity, decreased activity tolerance, decreased balance, decreased coordination, decreased endurance, decreased mobility, difficulty walking, decreased ROM, decreased strength, increased edema, impaired flexibility, improper body mechanics, postural dysfunction, and pain.   REHAB POTENTIAL: Good  CLINICAL DECISION MAKING: Stable/uncomplicated  EVALUATION COMPLEXITY: Low   GOALS: Goals reviewed with patient? Yes  SHORT TERM GOALS: Target date: 05/28/24 Independent with advanced HEP Goal status: progressing as she has one caregive that can do this 05/20/24  LONG TERM GOALS: Target date: 08/04/24  Independent with advanced HEP with caregiver Goal status: ongoing 05/29/24  2.  Transfer with set up and CGA Goal status: ongoing 05/15/24  3.  Walk HHA x 150 feet Goal status: Progressing 05/22/24  4.  Increase left LE strength to 4/5 Goal status: ongoing  05/29/24  5.  Report neck and shoulder pain decreased 50% Goal status: ongoing 05/22/24  PLAN:  PT FREQUENCY: 2x/week  PT DURATION: 12 weeks  PLANNED INTERVENTIONS: Therapeutic exercises, Therapeutic activity, Neuromuscular re-education, Balance training, Gait training, Patient/Family education, Self Care, Joint mobilization, Moist heat, Taping, and Manual therapy  PLAN FOR NEXT SESSION: advance activities really work on stability of the left knee   Lesle Faron W, PT 06/03/2024, 10:21 AM

## 2024-06-03 NOTE — Telephone Encounter (Signed)
 Auth Submission: NO AUTH NEEDED Site of care: [facility] Payer: Medicare A/B, AARP Supp   Medication & CPT/J Code(s) submitted: Prolia  (Denosumab ) [id number] Diagnosis Code: M81.0 Route of submission (phone, fax, portal):  Phone # Fax # Auth type: Buy/Bill HB Units/visits requested: 60mg  x 2 doses, q 6 months Reference number:  Approval from: 06/03/2024 to 09/27/24   Dagoberto Armour, CPhT [facility] Phone: [phone] 06/03/2024

## 2024-06-05 ENCOUNTER — Encounter: Payer: Self-pay | Admitting: Physical Therapy

## 2024-06-05 ENCOUNTER — Ambulatory Visit: Admitting: Physical Therapy

## 2024-06-05 DIAGNOSIS — R2681 Unsteadiness on feet: Secondary | ICD-10-CM | POA: Diagnosis not present

## 2024-06-05 DIAGNOSIS — M25512 Pain in left shoulder: Secondary | ICD-10-CM | POA: Diagnosis not present

## 2024-06-05 DIAGNOSIS — R270 Ataxia, unspecified: Secondary | ICD-10-CM | POA: Diagnosis not present

## 2024-06-05 DIAGNOSIS — M6281 Muscle weakness (generalized): Secondary | ICD-10-CM

## 2024-06-05 DIAGNOSIS — R262 Difficulty in walking, not elsewhere classified: Secondary | ICD-10-CM | POA: Diagnosis not present

## 2024-06-05 DIAGNOSIS — I6930 Unspecified sequelae of cerebral infarction: Secondary | ICD-10-CM

## 2024-06-05 DIAGNOSIS — M542 Cervicalgia: Secondary | ICD-10-CM

## 2024-06-05 DIAGNOSIS — R293 Abnormal posture: Secondary | ICD-10-CM

## 2024-06-05 DIAGNOSIS — M25572 Pain in left ankle and joints of left foot: Secondary | ICD-10-CM

## 2024-06-05 DIAGNOSIS — I69354 Hemiplegia and hemiparesis following cerebral infarction affecting left non-dominant side: Secondary | ICD-10-CM | POA: Diagnosis not present

## 2024-06-05 NOTE — Therapy (Signed)
 OUTPATIENT PHYSICAL THERAPY LOWER EXTREMITY TREATMENT Progress Note Reporting Period 05/05/24 to 06/05/24  See note below for Objective Data and Assessment of Progress/Goals.      Patient Name: Karen Jackson MRN: [id number] DOB:[birth year], 83 y.o., female Today's Date: 06/05/2024  END OF SESSION:  PT End of Session - 06/05/24 1103     Visit Number 10    Date for Recertification  08/04/24    Authorization Type Medicare    PT Start Time 1055    PT Stop Time 1140    PT Time Calculation (min) 45 min    Activity Tolerance Patient tolerated treatment well    Behavior During Therapy [facility] for tasks assessed/performed           Past Medical History:  Diagnosis Date   Acute cystitis without hematuria    Acute diastolic CHF (congestive heart failure) (HCC)    Arthritis    Dyspnea    Dysrhythmia    Fever of unknown origin 03/19/2017   Hyperlipidemia    Hypertension    denies at preop   Multifocal pneumonia    Neuromuscular disorder (HCC)    neuropathy left arm and foot   Osteopenia    Paralysis (HCC)    partial left side from CVA    Persistent atrial fibrillation (HCC)    PONV (postoperative nausea and vomiting)    Pre-diabetes    Stroke [facility]) 2013   hemmorahgic   Past Surgical History:  Procedure Laterality Date   ANKLE SURGERY     APPENDECTOMY     CHOLECYSTECTOMY     HARDWARE REMOVAL Left 03/29/2023   Procedure: HARDWARE REMOVAL;  Surgeon: Kit Rush, MD;  Location: [facility];  Service: Orthopedics;  Laterality: Left;   HERNIA REPAIR     Esophagus   INCISION AND DRAINAGE OF WOUND Left 03/29/2023   Procedure: LEFT ANKLE WOUND IRRIGATION AND DEBRIDEMENT WOUND;  Surgeon: Kit Rush, MD;  Location: [facility];  Service: Orthopedics;  Laterality: Left;   JOINT REPLACEMENT     total- right partial- left   MASTECTOMY PARTIAL / LUMPECTOMY  2012   left   ORIF ANKLE FRACTURE Left 07/20/2018   Procedure: OPEN REDUCTION INTERNAL FIXATION (ORIF)  ANKLE FRACTURE;  Surgeon: Kit Rush, MD;  Location: [facility];  Service: Orthopedics;  Laterality: Left;   TOTAL KNEE ARTHROPLASTY Left 01/27/2019   Procedure: TOTAL KNEE ARTHROPLASTY;  Surgeon: Rubie Kemps, MD;  Location: [facility];  Service: Orthopedics;  Laterality: Left;   Patient Active Problem List   Diagnosis Date Noted   Senile osteoporosis 06/02/2024   Goals of care, counseling/discussion 01/17/2022   Grief 01/17/2022   Secondary hypercoagulable state 03/16/2021   Dizziness 03/10/2021   Orthostatic hypotension 10/15/2020   Presbycusis of both ears 03/08/2020   Mixed stress and urge urinary incontinence 12/02/2019   Macrocytosis 12/01/2019   Nutritional anemia 12/01/2019   S/P total knee replacement 01/27/2019   Recurrent left knee instability 07/05/2018   Respiratory failure with hypoxia (HCC) 08/30/2017   Hypoxemia    Heart failure with preserved ejection fraction (HCC), Grade 3 diastolic dysfunction 03/26/2017   PAF (paroxysmal atrial fibrillation) (HCC)    Dyspnea 03/19/2017   Encounter for preventive health examination 02/17/2016   Sensorineural hearing loss (SNHL), bilateral 01/26/2016   Hypomagnesemia 04/24/2014   Hemiparesis affecting left side as late effect of cerebrovascular accident (HCC) 04/24/2014   Nontraumatic cerebral hemorrhage (HCC) 04/30/2012   DM (diabetes mellitus) with complications (HCC) 03/04/2010   OSTEOPENIA 01/21/2009  UNSPECIFIED VITAMIN D  DEFICIENCY 11/19/2007   HYPERCHOLESTEROLEMIA 10/25/2006   GASTROESOPHAGEAL REFLUX, NO ESOPHAGITIS 10/25/2006   DIVERTICULOSIS OF COLON 10/25/2006   Osteoarthritis 10/25/2006   CERVICAL SPINE DISORDER, NOS 10/25/2006    PCP: Shayne, MD  REFERRING PROVIDER: Shayne, MD  REFERRING DIAG: debility  THERAPY DIAG:  Muscle weakness (generalized)  Hemiplegia and hemiparesis following cerebral infarction affecting left non-dominant side (HCC)  Unsteadiness on feet  Left shoulder pain, unspecified  chronicity  Difficulty in walking, not elsewhere classified  Ataxia  Cervicalgia  Pain in left ankle and joints of left foot  Late effects of CVA (cerebrovascular accident)  Abnormal posture  Rationale for Evaluation and Treatment: Rehabilitation  ONSET DATE: 04/29/24  SUBJECTIVE:   SUBJECTIVE STATEMENT: Knee is sore.  Left shoulder/arm is sore today as well.  I did sit most of the day yesterday.    Patient has been a patient of mine off and on for many years, she has had many different diagnoses, she had a CVA about 13 years ago, she had a left ankle fracture with ORIF, she had a lot of issues with pain and wounds, so the hardware was removed last August, she has done pretty well but tends to back slide without PT with her ability to walk and transfer.  She has a caregiver about 11 hours a day.  She has been able to walk with me in PT, no one else has walked with her   PERTINENT HISTORY: CVA, CHF, HTN, A-fib, TKA PAIN:  Are you having pain? Yes: NPRS scale: 5-6/10 Pain location: left upper trap and neck, left ankle and knee  Pain description: ache sore Aggravating factors: neck and shoulder always hurt, knee and ankle hurt with transfers Relieving factors: massage, heat  PRECAUTIONS: None  RED FLAGS: None   WEIGHT BEARING RESTRICTIONS: No  FALLS:  Has patient fallen in last 6 months? No  LIVING ENVIRONMENT: Lives with: lives alone Lives in: House/apartment Stairs: No Has following equipment at home: Environmental consultant - 2 wheeled, Wheelchair (manual), shower chair, bed side commode, Grab bars, and Ramped entry care giver 11 hours a day  OCCUPATION: retired  PLOF: Needs assistance with homemaking, Needs assistance with transfers, and Leisure: plays bridge, she has 11 hours of an aide at home that helps with meals, going to MD's, dressing and bathing, is alone at night  PATIENT GOALS: walk, have less pain, transfer without difficulty  NEXT MD VISIT: none  scheduled  OBJECTIVE:   DIAGNOSTIC FINDINGS: none performed  COGNITION: Overall cognitive status: Within functional limits for tasks assessed     SENSATION: WFL POSTURE: rounded shoulders and forward head  PALPATION: Left knee and ankle are swollen and tender, left upper and neck  LOWER EXTREMITY ROM:  Active ROM Left eval Left 05/15/24  Hip flexion    Hip extension    Hip abduction    Hip adduction    Hip internal rotation    Hip external rotation    Knee flexion 90 91  Knee extension 0 0  Ankle dorsiflexion 5   Ankle plantarflexion 30   Ankle inversion 5   Ankle eversion 0    (Blank rows = not tested)  LOWER EXTREMITY MMT:  MMT Right eval Left eval  Hip flexion 3+ 3+  Hip extension    Hip abduction 4- 3+  Hip adduction 4- 3+  Hip internal rotation    Hip external rotation    Knee flexion 4- 3+  Knee extension 4- 3+  Ankle dorsiflexion  3+  Ankle plantarflexion    Ankle inversion    Ankle eversion     (Blank rows = not tested)  FUNCTIONAL TESTS:  Transfers Max A, left knee gives out and goes into ER and tends to bow into varus, has not walked since I discharged her 12/27/23.    GAIT: Distance walked: with HHA and w/c follow x 40 feet with pain in the left knee a 7/10, she tends to rotate the left hip posterior and really bring the left knee into extension   TODAY'S TREATMENT:                                                                                                                              DATE:  06/05/24 Nustep level 5 x 8 minutes a lot of cues to keep left knee in STM to the left upper trap and the rhomboid and into the neck Gait with HHA and w/c follow 90 feet some medial left knee pain 3# MArches 3# LAQ Red tband left hip adduction Red tband HS curls Weight 145# with 2 person assist to get on scale Gait with HHA and w/c follow x100 feet  06/03/24 Nustep level 5 x 8 minutes Gait HHA with W/C follow 80 feet 3# marches 3# LAQ Red  tband HS curls Red tband left hip adduction Red tband left hip abduction Red tband left ankle PF/DF Standing weight shifts Gait HHA with w/c follow 80 feet  05/29/24 Nustep level 5 x 8 minutes STM to the left upper trap, neck and rhomboids Gait with HHA and w/c follow x 80 feet 3# marches 3# LAQ Red tband HS curls Red ankle PF/DF Yellow left hip abd/adduction some pain with adduction Gait as noted above again 60 feet, this time limited by fatigue and SOB   05/27/24 Nustep level 5 x 8 minutes Standing weight shifts STM to the left upper trap, rhomboid and neck 3# LAQ 3# marches Blue tband HS curls some medial left knee pain Yellow tband left hip adduction Green tband clamshells with cues for the left hip Gait with HHA and w/c follow x80 feet, needs cues to keep the left foot in   05/22/24 Nustep level 5 x 8 minutes Standing weight shifts 2.5# marches 2.5# LAQ Red tband ankle PF/DF Yellow tband left hip adduction Red tband HS curls STM to the upper traps, neck and rhomboids Gait with HHA and W/C follow x 90 feet  05/20/24 Nustep level 5 x 8 minutes Red tband hip adduction and abduction, cues needed for form and to decreased the compensation of the left shoulder as she tends to elevate the left shoulder with left leg motions 2.5# marches 2.5# LAQ Yellow tband left ankle PF/DF and did some eversion STM to the left neck and upper trap and some into the left rhomboid Gait with HHA and W/C follow  30 feet and stopped due to left knee pain, then PT with some passive ROM  and approximation and we walked another 70 feet, cues to keep foot narrow   05/15/24 Nustep level 5 x 8 minutes Manual resistance clamshells with a lot of cues to do the left hip Red tband HS curls Ball b/n knees squeeze 2.5# LAQ 2.5# marches STM to bilat UT and cervical spine some with thera-gun Approximation to L digits   Yellow tband ankle PF/DF Gait HHA with w/c follow 90 feet, stopped due to  shortness of breath  05/13/24 Nustep level 5 x 8 minutes Red tband clamshells with a lot of cues to do the left hip Ball b/n knees squeeze Red tband left HS curls 2.5# LAQ 2.5# marches Yellow tband left hip adduction Standing weight shifts Gait HHA with w/c follow 90 feet, stopped due to shortness of breath Yellow tband ankle PF/DF  05/08/24 Nustep level 5 x 8 minutes STM to the left upper trap Passive left ankle stretch Red tband left ankle PF/DF Red tband bilateral HS curls 2.5# marches 2.5# LAQ Standing weight shifts Gait with HHA and w/c follow x75 feet   05/05/24 Nustep level 5 x 8 minutes    PATIENT EDUCATION:  Education details: POC Person educated: Patient Education method: Explanation Education comprehension: verbalized understanding  HOME EXERCISE PROGRAM: TBD  ASSESSMENT:  CLINICAL IMPRESSION:  Patient had more difficulty today with the left knee falling out, required more cues to bring the knee in to midline, she continues to report left shoulder upper trap and neck pain, she is very tight and I feel it is due to accessory motions with tone that kicks in.  I feel that the left knee hurts due to her keeping the left leg out and putting a valgus stress on the knee, required cues to keep left foot in with a smaller BOS, today was her best and farthest walk Patient is a 83 y.o. female who was seen today for physical therapy evaluation and treatment for debility,    she has not been able to walk or transfer or exercise much since D/C from PT in May.  I have seen her off and on over the past 20+ years and she is having a lot of difficulty controlling the left knee, it buckles and goes into varus and does some hyperextension, with walking 40 feet today she reported knee pain a 7/10,    OBJECTIVE IMPAIRMENTS: Abnormal gait, cardiopulmonary status limiting activity, decreased activity tolerance, decreased balance, decreased coordination, decreased endurance, decreased  mobility, difficulty walking, decreased ROM, decreased strength, increased edema, impaired flexibility, improper body mechanics, postural dysfunction, and pain.   REHAB POTENTIAL: Good  CLINICAL DECISION MAKING: Stable/uncomplicated  EVALUATION COMPLEXITY: Low   GOALS: Goals reviewed with patient? Yes  SHORT TERM GOALS: Target date: 05/28/24 Independent with advanced HEP Goal status: progressing as she has one caregive that can do this 05/20/24  LONG TERM GOALS: Target date: 08/04/24  Independent with advanced HEP with caregiver Goal status: ongoing 05/29/24  2.  Transfer with set up and CGA Goal status: ongoing 06/05/24  3.  Walk HHA x 150 feet Goal status: Progressing 05/22/24  4.  Increase left LE strength to 4/5 Goal status: ongoing 05/29/24  5.  Report neck and shoulder pain decreased 50% Goal status: ongoing 06/05/24  PLAN:  PT FREQUENCY: 2x/week  PT DURATION: 12 weeks  PLANNED INTERVENTIONS: Therapeutic exercises, Therapeutic activity, Neuromuscular re-education, Balance training, Gait training, Patient/Family education, Self Care, Joint mobilization, Moist heat, Taping, and Manual therapy  PLAN FOR NEXT SESSION: advance activities really work  on stability of the left knee, hip adduction   Giuliano Preece W, PT 06/05/2024, 11:05 AM

## 2024-06-10 ENCOUNTER — Encounter: Payer: Self-pay | Admitting: Physical Therapy

## 2024-06-10 ENCOUNTER — Ambulatory Visit: Admitting: Physical Therapy

## 2024-06-10 DIAGNOSIS — R2681 Unsteadiness on feet: Secondary | ICD-10-CM

## 2024-06-10 DIAGNOSIS — R270 Ataxia, unspecified: Secondary | ICD-10-CM

## 2024-06-10 DIAGNOSIS — M25572 Pain in left ankle and joints of left foot: Secondary | ICD-10-CM

## 2024-06-10 DIAGNOSIS — M6281 Muscle weakness (generalized): Secondary | ICD-10-CM | POA: Diagnosis not present

## 2024-06-10 DIAGNOSIS — M542 Cervicalgia: Secondary | ICD-10-CM

## 2024-06-10 DIAGNOSIS — I6930 Unspecified sequelae of cerebral infarction: Secondary | ICD-10-CM

## 2024-06-10 DIAGNOSIS — I69354 Hemiplegia and hemiparesis following cerebral infarction affecting left non-dominant side: Secondary | ICD-10-CM

## 2024-06-10 DIAGNOSIS — M25512 Pain in left shoulder: Secondary | ICD-10-CM

## 2024-06-10 DIAGNOSIS — R262 Difficulty in walking, not elsewhere classified: Secondary | ICD-10-CM

## 2024-06-10 NOTE — Therapy (Signed)
 OUTPATIENT PHYSICAL THERAPY LOWER EXTREMITY TREATMENT    Patient Name: Karen Jackson MRN: [id number] DOB:Jul 27, 1941, 83 y.o., female Today's Date: 06/10/2024  END OF SESSION:  PT End of Session - 06/10/24 1104     Visit Number 11    Date for Recertification  08/04/24    Authorization Type Medicare    PT Start Time 1100    PT Stop Time 1145    PT Time Calculation (min) 45 min    Activity Tolerance Patient tolerated treatment well    Behavior During Therapy [facility] for tasks assessed/performed           Past Medical History:  Diagnosis Date   Acute cystitis without hematuria    Acute diastolic CHF (congestive heart failure) (HCC)    Arthritis    Dyspnea    Dysrhythmia    Fever of unknown origin 03/19/2017   Hyperlipidemia    Hypertension    denies at preop   Multifocal pneumonia    Neuromuscular disorder (HCC)    neuropathy left arm and foot   Osteopenia    Paralysis (HCC)    partial left side from CVA    Persistent atrial fibrillation (HCC)    PONV (postoperative nausea and vomiting)    Pre-diabetes    Stroke [facility]) 2013   hemmorahgic   Past Surgical History:  Procedure Laterality Date   ANKLE SURGERY     APPENDECTOMY     CHOLECYSTECTOMY     HARDWARE REMOVAL Left 03/29/2023   Procedure: HARDWARE REMOVAL;  Surgeon: Kit Rush, MD;  Location: [facility];  Service: Orthopedics;  Laterality: Left;   HERNIA REPAIR     Esophagus   INCISION AND DRAINAGE OF WOUND Left 03/29/2023   Procedure: LEFT ANKLE WOUND IRRIGATION AND DEBRIDEMENT WOUND;  Surgeon: Kit Rush, MD;  Location: [facility];  Service: Orthopedics;  Laterality: Left;   JOINT REPLACEMENT     total- right partial- left   MASTECTOMY PARTIAL / LUMPECTOMY  2012   left   ORIF ANKLE FRACTURE Left 07/20/2018   Procedure: OPEN REDUCTION INTERNAL FIXATION (ORIF) ANKLE FRACTURE;  Surgeon: Kit Rush, MD;  Location: [facility];  Service: Orthopedics;  Laterality: Left;   TOTAL KNEE  ARTHROPLASTY Left 01/27/2019   Procedure: TOTAL KNEE ARTHROPLASTY;  Surgeon: Rubie Kemps, MD;  Location: [facility];  Service: Orthopedics;  Laterality: Left;   Patient Active Problem List   Diagnosis Date Noted   Senile osteoporosis 06/02/2024   Goals of care, counseling/discussion 01/17/2022   Grief 01/17/2022   Secondary hypercoagulable state 03/16/2021   Dizziness 03/10/2021   Orthostatic hypotension 10/15/2020   Presbycusis of both ears 03/08/2020   Mixed stress and urge urinary incontinence 12/02/2019   Macrocytosis 12/01/2019   Nutritional anemia 12/01/2019   S/P total knee replacement 01/27/2019   Recurrent left knee instability 07/05/2018   Respiratory failure with hypoxia (HCC) 08/30/2017   Hypoxemia    Heart failure with preserved ejection fraction (HCC), Grade 3 diastolic dysfunction 03/26/2017   PAF (paroxysmal atrial fibrillation) (HCC)    Dyspnea 03/19/2017   Encounter for preventive health examination 02/17/2016   Sensorineural hearing loss (SNHL), bilateral 01/26/2016   Hypomagnesemia 04/24/2014   Hemiparesis affecting left side as late effect of cerebrovascular accident (HCC) 04/24/2014   Nontraumatic cerebral hemorrhage (HCC) 04/30/2012   DM (diabetes mellitus) with complications (HCC) 03/04/2010   OSTEOPENIA 01/21/2009   UNSPECIFIED VITAMIN D  DEFICIENCY 11/19/2007   HYPERCHOLESTEROLEMIA 10/25/2006   GASTROESOPHAGEAL REFLUX, NO ESOPHAGITIS 10/25/2006  DIVERTICULOSIS OF COLON 10/25/2006   Osteoarthritis 10/25/2006   CERVICAL SPINE DISORDER, NOS 10/25/2006    PCP: Shayne, MD  REFERRING PROVIDER: Shayne, MD  REFERRING DIAG: debility  THERAPY DIAG:  Muscle weakness (generalized)  Hemiplegia and hemiparesis following cerebral infarction affecting left non-dominant side (HCC)  Unsteadiness on feet  Left shoulder pain, unspecified chronicity  Difficulty in walking, not elsewhere classified  Ataxia  Cervicalgia  Pain in left ankle and joints of left  foot  Late effects of CVA (cerebrovascular accident)  Rationale for Evaluation and Treatment: Rehabilitation  ONSET DATE: 04/29/24  SUBJECTIVE:   SUBJECTIVE STATEMENT: Knee is sore. I had a lot of stress this morning my garage door collapsed and we were stuck inside.   Patient has been a patient of mine off and on for many years, she has had many different diagnoses, she had a CVA about 13 years ago, she had a left ankle fracture with ORIF, she had a lot of issues with pain and wounds, so the hardware was removed last August, she has done pretty well but tends to back slide without PT with her ability to walk and transfer.  She has a caregiver about 11 hours a day.  She has been able to walk with me in PT, no one else has walked with her   PERTINENT HISTORY: CVA, CHF, HTN, A-fib, TKA PAIN:  Are you having pain? Yes: NPRS scale: 5-6/10 Pain location: left upper trap and neck, left ankle and knee  Pain description: ache sore Aggravating factors: neck and shoulder always hurt, knee and ankle hurt with transfers Relieving factors: massage, heat  PRECAUTIONS: None  RED FLAGS: None   WEIGHT BEARING RESTRICTIONS: No  FALLS:  Has patient fallen in last 6 months? No  LIVING ENVIRONMENT: Lives with: lives alone Lives in: House/apartment Stairs: No Has following equipment at home: Environmental consultant - 2 wheeled, Wheelchair (manual), shower chair, bed side commode, Grab bars, and Ramped entry care giver 11 hours a day  OCCUPATION: retired  PLOF: Needs assistance with homemaking, Needs assistance with transfers, and Leisure: plays bridge, she has 11 hours of an aide at home that helps with meals, going to MD's, dressing and bathing, is alone at night  PATIENT GOALS: walk, have less pain, transfer without difficulty  NEXT MD VISIT: none scheduled  OBJECTIVE:   DIAGNOSTIC FINDINGS: none performed  COGNITION: Overall cognitive status: Within functional limits for tasks  assessed     SENSATION: WFL POSTURE: rounded shoulders and forward head  PALPATION: Left knee and ankle are swollen and tender, left upper and neck  LOWER EXTREMITY ROM:  Active ROM Left eval Left 05/15/24  Hip flexion    Hip extension    Hip abduction    Hip adduction    Hip internal rotation    Hip external rotation    Knee flexion 90 91  Knee extension 0 0  Ankle dorsiflexion 5   Ankle plantarflexion 30   Ankle inversion 5   Ankle eversion 0    (Blank rows = not tested)  LOWER EXTREMITY MMT:  MMT Right eval Left eval  Hip flexion 3+ 3+  Hip extension    Hip abduction 4- 3+  Hip adduction 4- 3+  Hip internal rotation    Hip external rotation    Knee flexion 4- 3+  Knee extension 4- 3+  Ankle dorsiflexion  3+  Ankle plantarflexion    Ankle inversion    Ankle eversion     (Blank rows =  not tested)  FUNCTIONAL TESTS:  Transfers Max A, left knee gives out and goes into ER and tends to bow into varus, has not walked since I discharged her 12/27/23.    GAIT: Distance walked: with HHA and w/c follow x 40 feet with pain in the left knee a 7/10, she tends to rotate the left hip posterior and really bring the left knee into extension   TODAY'S TREATMENT:                                                                                                                              DATE:  06/10/24 Nustep level 5 x 8 minutesm cues to keep left knee in STM to the left upper trap, neck and rhomboid 4# LAQ 4# marches Red tband HS curls Red tbnd ankle PF/DF Yellow tband with her really focused on the left only hip adduction and abduction Gait with HHA and W/C follow 2 x75 feet  06/05/24 Nustep level 5 x 8 minutes a lot of cues to keep left knee in STM to the left upper trap and the rhomboid and into the neck Gait with HHA and w/c follow 90 feet some medial left knee pain 3# MArches 3# LAQ Red tband left hip adduction Red tband HS curls Weight 145# with 2 person  assist to get on scale Gait with HHA and w/c follow x100 feet  06/03/24 Nustep level 5 x 8 minutes Gait HHA with W/C follow 80 feet 3# marches 3# LAQ Red tband HS curls Red tband left hip adduction Red tband left hip abduction Red tband left ankle PF/DF Standing weight shifts Gait HHA with w/c follow 80 feet  05/29/24 Nustep level 5 x 8 minutes STM to the left upper trap, neck and rhomboids Gait with HHA and w/c follow x 80 feet 3# marches 3# LAQ Red tband HS curls Red ankle PF/DF Yellow left hip abd/adduction some pain with adduction Gait as noted above again 60 feet, this time limited by fatigue and SOB   05/27/24 Nustep level 5 x 8 minutes Standing weight shifts STM to the left upper trap, rhomboid and neck 3# LAQ 3# marches Blue tband HS curls some medial left knee pain Yellow tband left hip adduction Green tband clamshells with cues for the left hip Gait with HHA and w/c follow x80 feet, needs cues to keep the left foot in   05/22/24 Nustep level 5 x 8 minutes Standing weight shifts 2.5# marches 2.5# LAQ Red tband ankle PF/DF Yellow tband left hip adduction Red tband HS curls STM to the upper traps, neck and rhomboids Gait with HHA and W/C follow x 90 feet  05/20/24 Nustep level 5 x 8 minutes Red tband hip adduction and abduction, cues needed for form and to decreased the compensation of the left shoulder as she tends to elevate the left shoulder with left leg motions 2.5# marches 2.5# LAQ Yellow tband left ankle PF/DF and did some  eversion STM to the left neck and upper trap and some into the left rhomboid Gait with HHA and W/C follow  30 feet and stopped due to left knee pain, then PT with some passive ROM and approximation and we walked another 70 feet, cues to keep foot narrow   05/15/24 Nustep level 5 x 8 minutes Manual resistance clamshells with a lot of cues to do the left hip Red tband HS curls Ball b/n knees squeeze 2.5# LAQ 2.5# marches STM  to bilat UT and cervical spine some with thera-gun Approximation to L digits   Yellow tband ankle PF/DF Gait HHA with w/c follow 90 feet, stopped due to shortness of breath  PATIENT EDUCATION:  Education details: POC Person educated: Patient Education method: Explanation Education comprehension: verbalized understanding  HOME EXERCISE PROGRAM: 06/10/24  Marches, LAQ, ankle PF/DF, left hip adduction and abduction  ASSESSMENT:  CLINICAL IMPRESSION:  Patient had more difficulty today she reports that she had a lot of stress this morning with her garage door and being stuck in the house and unable to get out.  She did walk well but had some knee and ankle pain, was SOB today.  I feel that the left knee hurts due to her keeping the left leg out and putting a valgus stress on the knee, required cues to keep left foot in with a smaller BOS.  Today she was a little more distracted and this seemed to make things a little tougher today.  Patient is a 84 y.o. female who was seen today for physical therapy evaluation and treatment for debility,    she has not been able to walk or transfer or exercise much since D/C from PT in May.  I have seen her off and on over the past 20+ years and she is having a lot of difficulty controlling the left knee, it buckles and goes into varus and does some hyperextension, with walking 40 feet today she reported knee pain a 7/10,    OBJECTIVE IMPAIRMENTS: Abnormal gait, cardiopulmonary status limiting activity, decreased activity tolerance, decreased balance, decreased coordination, decreased endurance, decreased mobility, difficulty walking, decreased ROM, decreased strength, increased edema, impaired flexibility, improper body mechanics, postural dysfunction, and pain.   REHAB POTENTIAL: Good  CLINICAL DECISION MAKING: Stable/uncomplicated  EVALUATION COMPLEXITY: Low   GOALS: Goals reviewed with patient? Yes  SHORT TERM GOALS: Target date: 05/28/24 Independent  with advanced HEP Goal status: progressing as she has one caregive that can do this 05/20/24  LONG TERM GOALS: Target date: 08/04/24  Independent with advanced HEP with caregiver Goal status: ongoing 05/29/24  2.  Transfer with set up and CGA Goal status: ongoing 06/05/24  3.  Walk HHA x 150 feet Goal status: Progressing 05/22/24  4.  Increase left LE strength to 4/5 Goal status: ongoing 05/29/24  5.  Report neck and shoulder pain decreased 50% Goal status: ongoing 06/05/24  PLAN:  PT FREQUENCY: 2x/week  PT DURATION: 12 weeks  PLANNED INTERVENTIONS: Therapeutic exercises, Therapeutic activity, Neuromuscular re-education, Balance training, Gait training, Patient/Family education, Self Care, Joint mobilization, Moist heat, Taping, and Manual therapy  PLAN FOR NEXT SESSION: advance activities really work on stability of the left knee, hip adduction   Mirra Basilio W, PT 06/10/2024, 11:05 AM

## 2024-06-12 ENCOUNTER — Encounter: Payer: Self-pay | Admitting: Physical Therapy

## 2024-06-12 ENCOUNTER — Ambulatory Visit: Admitting: Physical Therapy

## 2024-06-12 DIAGNOSIS — M6281 Muscle weakness (generalized): Secondary | ICD-10-CM | POA: Diagnosis not present

## 2024-06-12 DIAGNOSIS — R2681 Unsteadiness on feet: Secondary | ICD-10-CM

## 2024-06-12 DIAGNOSIS — I69354 Hemiplegia and hemiparesis following cerebral infarction affecting left non-dominant side: Secondary | ICD-10-CM | POA: Diagnosis not present

## 2024-06-12 DIAGNOSIS — R293 Abnormal posture: Secondary | ICD-10-CM

## 2024-06-12 DIAGNOSIS — R270 Ataxia, unspecified: Secondary | ICD-10-CM | POA: Diagnosis not present

## 2024-06-12 DIAGNOSIS — M25572 Pain in left ankle and joints of left foot: Secondary | ICD-10-CM

## 2024-06-12 DIAGNOSIS — I6930 Unspecified sequelae of cerebral infarction: Secondary | ICD-10-CM

## 2024-06-12 DIAGNOSIS — M25512 Pain in left shoulder: Secondary | ICD-10-CM

## 2024-06-12 DIAGNOSIS — R262 Difficulty in walking, not elsewhere classified: Secondary | ICD-10-CM | POA: Diagnosis not present

## 2024-06-12 DIAGNOSIS — M542 Cervicalgia: Secondary | ICD-10-CM

## 2024-06-12 NOTE — Therapy (Signed)
 OUTPATIENT PHYSICAL THERAPY LOWER EXTREMITY TREATMENT    Patient Name: Karen Jackson MRN: [id number] DOB:Aug 12, 1941, 83 y.o., female Today's Date: 06/12/2024  END OF SESSION:  PT End of Session - 06/12/24 1102     Visit Number 12    Date for Recertification  08/04/24    Authorization Type Medicare    PT Start Time 1058    PT Stop Time 1143    PT Time Calculation (min) 45 min    Activity Tolerance Patient tolerated treatment well    Behavior During Therapy [facility] for tasks assessed/performed           Past Medical History:  Diagnosis Date   Acute cystitis without hematuria    Acute diastolic CHF (congestive heart failure) (HCC)    Arthritis    Dyspnea    Dysrhythmia    Fever of unknown origin 03/19/2017   Hyperlipidemia    Hypertension    denies at preop   Multifocal pneumonia    Neuromuscular disorder (HCC)    neuropathy left arm and foot   Osteopenia    Paralysis (HCC)    partial left side from CVA    Persistent atrial fibrillation (HCC)    PONV (postoperative nausea and vomiting)    Pre-diabetes    Stroke [facility]) 2013   hemmorahgic   Past Surgical History:  Procedure Laterality Date   ANKLE SURGERY     APPENDECTOMY     CHOLECYSTECTOMY     HARDWARE REMOVAL Left 03/29/2023   Procedure: HARDWARE REMOVAL;  Surgeon: Kit Rush, MD;  Location: [facility];  Service: Orthopedics;  Laterality: Left;   HERNIA REPAIR     Esophagus   INCISION AND DRAINAGE OF WOUND Left 03/29/2023   Procedure: LEFT ANKLE WOUND IRRIGATION AND DEBRIDEMENT WOUND;  Surgeon: Kit Rush, MD;  Location: [facility];  Service: Orthopedics;  Laterality: Left;   JOINT REPLACEMENT     total- right partial- left   MASTECTOMY PARTIAL / LUMPECTOMY  2012   left   ORIF ANKLE FRACTURE Left 07/20/2018   Procedure: OPEN REDUCTION INTERNAL FIXATION (ORIF) ANKLE FRACTURE;  Surgeon: Kit Rush, MD;  Location: [facility] OR;  Service: Orthopedics;  Laterality: Left;   TOTAL KNEE  ARTHROPLASTY Left 01/27/2019   Procedure: TOTAL KNEE ARTHROPLASTY;  Surgeon: Rubie Kemps, MD;  Location: [facility];  Service: Orthopedics;  Laterality: Left;   Patient Active Problem List   Diagnosis Date Noted   Senile osteoporosis 06/02/2024   Goals of care, counseling/discussion 01/17/2022   Grief 01/17/2022   Secondary hypercoagulable state 03/16/2021   Dizziness 03/10/2021   Orthostatic hypotension 10/15/2020   Presbycusis of both ears 03/08/2020   Mixed stress and urge urinary incontinence 12/02/2019   Macrocytosis 12/01/2019   Nutritional anemia 12/01/2019   S/P total knee replacement 01/27/2019   Recurrent left knee instability 07/05/2018   Respiratory failure with hypoxia (HCC) 08/30/2017   Hypoxemia    Heart failure with preserved ejection fraction (HCC), Grade 3 diastolic dysfunction 03/26/2017   PAF (paroxysmal atrial fibrillation) (HCC)    Dyspnea 03/19/2017   Encounter for preventive health examination 02/17/2016   Sensorineural hearing loss (SNHL), bilateral 01/26/2016   Hypomagnesemia 04/24/2014   Hemiparesis affecting left side as late effect of cerebrovascular accident (HCC) 04/24/2014   Nontraumatic cerebral hemorrhage (HCC) 04/30/2012   DM (diabetes mellitus) with complications (HCC) 03/04/2010   OSTEOPENIA 01/21/2009   UNSPECIFIED VITAMIN D  DEFICIENCY 11/19/2007   HYPERCHOLESTEROLEMIA 10/25/2006   GASTROESOPHAGEAL REFLUX, NO ESOPHAGITIS 10/25/2006  DIVERTICULOSIS OF COLON 10/25/2006   Osteoarthritis 10/25/2006   CERVICAL SPINE DISORDER, NOS 10/25/2006    PCP: Shayne, MD  REFERRING PROVIDER: Shayne, MD  REFERRING DIAG: debility  THERAPY DIAG:  Muscle weakness (generalized)  Hemiplegia and hemiparesis following cerebral infarction affecting left non-dominant side (HCC)  Unsteadiness on feet  Left shoulder pain, unspecified chronicity  Difficulty in walking, not elsewhere classified  Ataxia  Cervicalgia  Pain in left ankle and joints of left  foot  Late effects of CVA (cerebrovascular accident)  Abnormal posture  Rationale for Evaluation and Treatment: Rehabilitation  ONSET DATE: 04/29/24  SUBJECTIVE:   SUBJECTIVE STATEMENT: REports that she went out to eat yesterday and was able to transfer safely with help  Patient has been a patient of mine off and on for many years, she has had many different diagnoses, she had a CVA about 13 years ago, she had a left ankle fracture with ORIF, she had a lot of issues with pain and wounds, so the hardware was removed last August, she has done pretty well but tends to back slide without PT with her ability to walk and transfer.  She has a caregiver about 11 hours a day.  She has been able to walk with me in PT, no one else has walked with her   PERTINENT HISTORY: CVA, CHF, HTN, A-fib, TKA PAIN:  Are you having pain? Yes: NPRS scale: 5-6/10 Pain location: left upper trap and neck, left ankle and knee  Pain description: ache sore Aggravating factors: neck and shoulder always hurt, knee and ankle hurt with transfers Relieving factors: massage, heat  PRECAUTIONS: None  RED FLAGS: None   WEIGHT BEARING RESTRICTIONS: No  FALLS:  Has patient fallen in last 6 months? No  LIVING ENVIRONMENT: Lives with: lives alone Lives in: House/apartment Stairs: No Has following equipment at home: Environmental consultant - 2 wheeled, Wheelchair (manual), shower chair, bed side commode, Grab bars, and Ramped entry care giver 11 hours a day  OCCUPATION: retired  PLOF: Needs assistance with homemaking, Needs assistance with transfers, and Leisure: plays bridge, she has 11 hours of an aide at home that helps with meals, going to MD's, dressing and bathing, is alone at night  PATIENT GOALS: walk, have less pain, transfer without difficulty  NEXT MD VISIT: none scheduled  OBJECTIVE:   DIAGNOSTIC FINDINGS: none performed  COGNITION: Overall cognitive status: Within functional limits for tasks  assessed     SENSATION: WFL POSTURE: rounded shoulders and forward head  PALPATION: Left knee and ankle are swollen and tender, left upper and neck  LOWER EXTREMITY ROM:  Active ROM Left eval Left 05/15/24  Hip flexion    Hip extension    Hip abduction    Hip adduction    Hip internal rotation    Hip external rotation    Knee flexion 90 91  Knee extension 0 0  Ankle dorsiflexion 5   Ankle plantarflexion 30   Ankle inversion 5   Ankle eversion 0    (Blank rows = not tested)  LOWER EXTREMITY MMT:  MMT Right eval Left eval  Hip flexion 3+ 3+  Hip extension    Hip abduction 4- 3+  Hip adduction 4- 3+  Hip internal rotation    Hip external rotation    Knee flexion 4- 3+  Knee extension 4- 3+  Ankle dorsiflexion  3+  Ankle plantarflexion    Ankle inversion    Ankle eversion     (Blank rows = not tested)  FUNCTIONAL TESTS:  Transfers Max A, left knee gives out and goes into ER and tends to bow into varus, has not walked since I discharged her 12/27/23.    GAIT: Distance walked: with HHA and w/c follow x 40 feet with pain in the left knee a 7/10, she tends to rotate the left hip posterior and really bring the left knee into extension   TODAY'S TREATMENT:                                                                                                                              DATE:  06/12/24 Nustep level 5 x 8 minutes Supine bridges Supine rotations with knees tied together with red tband and feet on ball then bridges with legs on ball Isometric abs with ball Use of sliding board hip abduction and hip/knee flexion Red tband left hip adduction in hooklying Blue tband left hip extension in supine Gait with HHA and W/c follow x 90 feet  06/10/24 Nustep level 5 x 8 minutesm cues to keep left knee in STM to the left upper trap, neck and rhomboid 4# LAQ 4# marches Red tband HS curls Red tbnd ankle PF/DF Yellow tband with her really focused on the left only hip  adduction and abduction Gait with HHA and W/C follow 2 x75 feet  06/05/24 Nustep level 5 x 8 minutes a lot of cues to keep left knee in STM to the left upper trap and the rhomboid and into the neck Gait with HHA and w/c follow 90 feet some medial left knee pain 3# MArches 3# LAQ Red tband left hip adduction Red tband HS curls Weight 145# with 2 person assist to get on scale Gait with HHA and w/c follow x100 feet  06/03/24 Nustep level 5 x 8 minutes Gait HHA with W/C follow 80 feet 3# marches 3# LAQ Red tband HS curls Red tband left hip adduction Red tband left hip abduction Red tband left ankle PF/DF Standing weight shifts Gait HHA with w/c follow 80 feet  05/29/24 Nustep level 5 x 8 minutes STM to the left upper trap, neck and rhomboids Gait with HHA and w/c follow x 80 feet 3# marches 3# LAQ Red tband HS curls Red ankle PF/DF Yellow left hip abd/adduction some pain with adduction Gait as noted above again 60 feet, this time limited by fatigue and SOB   05/27/24 Nustep level 5 x 8 minutes Standing weight shifts STM to the left upper trap, rhomboid and neck 3# LAQ 3# marches Blue tband HS curls some medial left knee pain Yellow tband left hip adduction Green tband clamshells with cues for the left hip Gait with HHA and w/c follow x80 feet, needs cues to keep the left foot in   05/22/24 Nustep level 5 x 8 minutes Standing weight shifts 2.5# marches 2.5# LAQ Red tband ankle PF/DF Yellow tband left hip adduction Red tband HS curls STM to the upper traps, neck  and rhomboids Gait with HHA and W/C follow x 90 feet  05/20/24 Nustep level 5 x 8 minutes Red tband hip adduction and abduction, cues needed for form and to decreased the compensation of the left shoulder as she tends to elevate the left shoulder with left leg motions 2.5# marches 2.5# LAQ Yellow tband left ankle PF/DF and did some eversion STM to the left neck and upper trap and some into the left  rhomboid Gait with HHA and W/C follow  30 feet and stopped due to left knee pain, then PT with some passive ROM and approximation and we walked another 70 feet, cues to keep foot narrow   05/15/24 Nustep level 5 x 8 minutes Manual resistance clamshells with a lot of cues to do the left hip Red tband HS curls Ball b/n knees squeeze 2.5# LAQ 2.5# marches STM to bilat UT and cervical spine some with thera-gun Approximation to L digits   Yellow tband ankle PF/DF Gait HHA with w/c follow 90 feet, stopped due to shortness of breath  PATIENT EDUCATION:  Education details: POC Person educated: Patient Education method: Explanation Education comprehension: verbalized understanding  HOME EXERCISE PROGRAM: 06/10/24  Marches, LAQ, ankle PF/DF, left hip adduction and abduction  ASSESSMENT:  CLINICAL IMPRESSION:  I did a lot more work with patient in supine, this allowed better isolation of the core and of the left LE, I feel we need to do this more as I can have her control the motions better and there is less compensatory patterns.   Patient is a 83 y.o. female who was seen today for physical therapy evaluation and treatment for debility,    she has not been able to walk or transfer or exercise much since D/C from PT in May.  I have seen her off and on over the past 20+ years and she is having a lot of difficulty controlling the left knee, it buckles and goes into varus and does some hyperextension, with walking 40 feet today she reported knee pain a 7/10,    OBJECTIVE IMPAIRMENTS: Abnormal gait, cardiopulmonary status limiting activity, decreased activity tolerance, decreased balance, decreased coordination, decreased endurance, decreased mobility, difficulty walking, decreased ROM, decreased strength, increased edema, impaired flexibility, improper body mechanics, postural dysfunction, and pain.   REHAB POTENTIAL: Good  CLINICAL DECISION MAKING: Stable/uncomplicated  EVALUATION COMPLEXITY:  Low   GOALS: Goals reviewed with patient? Yes  SHORT TERM GOALS: Target date: 05/28/24 Independent with advanced HEP Goal status: progressing as she has one caregive that can do this 05/20/24  LONG TERM GOALS: Target date: 08/04/24  Independent with advanced HEP with caregiver Goal status: ongoing 05/29/24  2.  Transfer with set up and CGA Goal status: ongoing 06/05/24  3.  Walk HHA x 150 feet Goal status: Progressing 05/22/24  4.  Increase left LE strength to 4/5 Goal status: ongoing 06/12/24  5.  Report neck and shoulder pain decreased 50% Goal status: ongoing 06/12/24  PLAN:  PT FREQUENCY: 2x/week  PT DURATION: 12 weeks  PLANNED INTERVENTIONS: Therapeutic exercises, Therapeutic activity, Neuromuscular re-education, Balance training, Gait training, Patient/Family education, Self Care, Joint mobilization, Moist heat, Taping, and Manual therapy  PLAN FOR NEXT SESSION: advance activities really work on stability of the left knee, hip adduction   Wyat Infinger W, PT 06/12/2024, 11:02 AM

## 2024-06-17 ENCOUNTER — Ambulatory Visit: Admitting: Physical Therapy

## 2024-06-17 ENCOUNTER — Encounter: Payer: Self-pay | Admitting: Physical Therapy

## 2024-06-17 DIAGNOSIS — R262 Difficulty in walking, not elsewhere classified: Secondary | ICD-10-CM | POA: Diagnosis not present

## 2024-06-17 DIAGNOSIS — M25512 Pain in left shoulder: Secondary | ICD-10-CM

## 2024-06-17 DIAGNOSIS — R270 Ataxia, unspecified: Secondary | ICD-10-CM | POA: Diagnosis not present

## 2024-06-17 DIAGNOSIS — I69354 Hemiplegia and hemiparesis following cerebral infarction affecting left non-dominant side: Secondary | ICD-10-CM

## 2024-06-17 DIAGNOSIS — M6281 Muscle weakness (generalized): Secondary | ICD-10-CM

## 2024-06-17 DIAGNOSIS — R2681 Unsteadiness on feet: Secondary | ICD-10-CM

## 2024-06-17 NOTE — Therapy (Signed)
 OUTPATIENT PHYSICAL THERAPY LOWER EXTREMITY TREATMENT    Patient Name: Karen Jackson MRN: [id number] DOB:1941-08-19, 83 y.o., female Today's Date: 06/17/2024  END OF SESSION:  PT End of Session - 06/17/24 1102     Visit Number 13    Date for Recertification  08/04/24    PT Start Time 1100    PT Stop Time 1145    PT Time Calculation (min) 45 min    Activity Tolerance Patient tolerated treatment well    Behavior During Therapy [facility] for tasks assessed/performed           Past Medical History:  Diagnosis Date   Acute cystitis without hematuria    Acute diastolic CHF (congestive heart failure) (HCC)    Arthritis    Dyspnea    Dysrhythmia    Fever of unknown origin 03/19/2017   Hyperlipidemia    Hypertension    denies at preop   Multifocal pneumonia    Neuromuscular disorder (HCC)    neuropathy left arm and foot   Osteopenia    Paralysis (HCC)    partial left side from CVA    Persistent atrial fibrillation (HCC)    PONV (postoperative nausea and vomiting)    Pre-diabetes    Stroke [facility]) 2013   hemmorahgic   Past Surgical History:  Procedure Laterality Date   ANKLE SURGERY     APPENDECTOMY     CHOLECYSTECTOMY     HARDWARE REMOVAL Left 03/29/2023   Procedure: HARDWARE REMOVAL;  Surgeon: Kit Rush, MD;  Location: [facility];  Service: Orthopedics;  Laterality: Left;   HERNIA REPAIR     Esophagus   INCISION AND DRAINAGE OF WOUND Left 03/29/2023   Procedure: LEFT ANKLE WOUND IRRIGATION AND DEBRIDEMENT WOUND;  Surgeon: Kit Rush, MD;  Location: [facility];  Service: Orthopedics;  Laterality: Left;   JOINT REPLACEMENT     total- right partial- left   MASTECTOMY PARTIAL / LUMPECTOMY  2012   left   ORIF ANKLE FRACTURE Left 07/20/2018   Procedure: OPEN REDUCTION INTERNAL FIXATION (ORIF) ANKLE FRACTURE;  Surgeon: Kit Rush, MD;  Location: [facility];  Service: Orthopedics;  Laterality: Left;   TOTAL KNEE ARTHROPLASTY Left 01/27/2019    Procedure: TOTAL KNEE ARTHROPLASTY;  Surgeon: Rubie Kemps, MD;  Location: [facility];  Service: Orthopedics;  Laterality: Left;   Patient Active Problem List   Diagnosis Date Noted   Senile osteoporosis 06/02/2024   Goals of care, counseling/discussion 01/17/2022   Grief 01/17/2022   Secondary hypercoagulable state 03/16/2021   Dizziness 03/10/2021   Orthostatic hypotension 10/15/2020   Presbycusis of both ears 03/08/2020   Mixed stress and urge urinary incontinence 12/02/2019   Macrocytosis 12/01/2019   Nutritional anemia 12/01/2019   S/P total knee replacement 01/27/2019   Recurrent left knee instability 07/05/2018   Respiratory failure with hypoxia (HCC) 08/30/2017   Hypoxemia    Heart failure with preserved ejection fraction (HCC), Grade 3 diastolic dysfunction 03/26/2017   PAF (paroxysmal atrial fibrillation) (HCC)    Dyspnea 03/19/2017   Encounter for preventive health examination 02/17/2016   Sensorineural hearing loss (SNHL), bilateral 01/26/2016   Hypomagnesemia 04/24/2014   Hemiparesis affecting left side as late effect of cerebrovascular accident (HCC) 04/24/2014   Nontraumatic cerebral hemorrhage (HCC) 04/30/2012   DM (diabetes mellitus) with complications (HCC) 03/04/2010   OSTEOPENIA 01/21/2009   UNSPECIFIED VITAMIN D  DEFICIENCY 11/19/2007   HYPERCHOLESTEROLEMIA 10/25/2006   GASTROESOPHAGEAL REFLUX, NO ESOPHAGITIS 10/25/2006   DIVERTICULOSIS OF COLON 10/25/2006  Osteoarthritis 10/25/2006   CERVICAL SPINE DISORDER, NOS 10/25/2006    PCP: Shayne, MD  REFERRING PROVIDER: Shayne, MD  REFERRING DIAG: debility  THERAPY DIAG:  Muscle weakness (generalized)  Hemiplegia and hemiparesis following cerebral infarction affecting left non-dominant side (HCC)  Unsteadiness on feet  Left shoulder pain, unspecified chronicity  Difficulty in walking, not elsewhere classified  Rationale for Evaluation and Treatment: Rehabilitation  ONSET DATE: 04/29/24  SUBJECTIVE:    SUBJECTIVE STATEMENT: Shoulder is always sore, knee is sore  Patient has been a patient of mine off and on for many years, she has had many different diagnoses, she had a CVA about 13 years ago, she had a left ankle fracture with ORIF, she had a lot of issues with pain and wounds, so the hardware was removed last August, she has done pretty well but tends to back slide without PT with her ability to walk and transfer.  She has a caregiver about 11 hours a day.  She has been able to walk with me in PT, no one else has walked with her   PERTINENT HISTORY: CVA, CHF, HTN, A-fib, TKA PAIN:  Are you having pain? Yes: NPRS scale: Just soreness/10 Pain location: left upper trap and neck, left ankle and knee  Pain description: ache sore Aggravating factors: neck and shoulder always hurt, knee and ankle hurt with transfers Relieving factors: massage, heat  PRECAUTIONS: None  RED FLAGS: None   WEIGHT BEARING RESTRICTIONS: No  FALLS:  Has patient fallen in last 6 months? No  LIVING ENVIRONMENT: Lives with: lives alone Lives in: House/apartment Stairs: No Has following equipment at home: Environmental consultant - 2 wheeled, Wheelchair (manual), shower chair, bed side commode, Grab bars, and Ramped entry care giver 11 hours a day  OCCUPATION: retired  PLOF: Needs assistance with homemaking, Needs assistance with transfers, and Leisure: plays bridge, she has 11 hours of an aide at home that helps with meals, going to MD's, dressing and bathing, is alone at night  PATIENT GOALS: walk, have less pain, transfer without difficulty  NEXT MD VISIT: none scheduled  OBJECTIVE:   DIAGNOSTIC FINDINGS: none performed  COGNITION: Overall cognitive status: Within functional limits for tasks assessed     SENSATION: WFL POSTURE: rounded shoulders and forward head  PALPATION: Left knee and ankle are swollen and tender, left upper and neck  LOWER EXTREMITY ROM:  Active ROM Left eval Left 05/15/24  Hip  flexion    Hip extension    Hip abduction    Hip adduction    Hip internal rotation    Hip external rotation    Knee flexion 90 91  Knee extension 0 0  Ankle dorsiflexion 5   Ankle plantarflexion 30   Ankle inversion 5   Ankle eversion 0    (Blank rows = not tested)  LOWER EXTREMITY MMT:  MMT Right eval Left eval  Hip flexion 3+ 3+  Hip extension    Hip abduction 4- 3+  Hip adduction 4- 3+  Hip internal rotation    Hip external rotation    Knee flexion 4- 3+  Knee extension 4- 3+  Ankle dorsiflexion  3+  Ankle plantarflexion    Ankle inversion    Ankle eversion     (Blank rows = not tested)  FUNCTIONAL TESTS:  Transfers Max A, left knee gives out and goes into ER and tends to bow into varus, has not walked since I discharged her 12/27/23.    GAIT: Distance walked: with HHA and  w/c follow x 40 feet with pain in the left knee a 7/10, she tends to rotate the left hip posterior and really bring the left knee into extension   TODAY'S TREATMENT:                                                                                                                              DATE:  06/17/24 Nustep level 4 x 8 minutes Standing march  Side steps at mat  HS curls red 2x10 LAQ 3lb 2x10 STM to UT and cervical spine  Some with Tgun Gait 20ft HHA and W/c follow x 90 feet  06/12/24 Nustep level 5 x 8 minutes Supine bridges Supine rotations with knees tied together with red tband and feet on ball then bridges with legs on ball Isometric abs with ball Use of sliding board hip abduction and hip/knee flexion Red tband left hip adduction in hooklying Blue tband left hip extension in supine Gait with HHA and W/c follow x 90 feet  06/10/24 Nustep level 5 x 8 minutesm cues to keep left knee in STM to the left upper trap, neck and rhomboid 4# LAQ 4# marches Red tband HS curls Red tbnd ankle PF/DF Yellow tband with her really focused on the left only hip adduction and  abduction Gait with HHA and W/C follow 2 x75 feet  06/05/24 Nustep level 5 x 8 minutes a lot of cues to keep left knee in STM to the left upper trap and the rhomboid and into the neck Gait with HHA and w/c follow 90 feet some medial left knee pain 3# MArches 3# LAQ Red tband left hip adduction Red tband HS curls Weight 145# with 2 person assist to get on scale Gait with HHA and w/c follow x100 feet  06/03/24 Nustep level 5 x 8 minutes Gait HHA with W/C follow 80 feet 3# marches 3# LAQ Red tband HS curls Red tband left hip adduction Red tband left hip abduction Red tband left ankle PF/DF Standing weight shifts Gait HHA with w/c follow 80 feet  05/29/24 Nustep level 5 x 8 minutes STM to the left upper trap, neck and rhomboids Gait with HHA and w/c follow x 80 feet 3# marches 3# LAQ Red tband HS curls Red ankle PF/DF Yellow left hip abd/adduction some pain with adduction Gait as noted above again 60 feet, this time limited by fatigue and SOB   05/27/24 Nustep level 5 x 8 minutes Standing weight shifts STM to the left upper trap, rhomboid and neck 3# LAQ 3# marches Blue tband HS curls some medial left knee pain Yellow tband left hip adduction Green tband clamshells with cues for the left hip Gait with HHA and w/c follow x80 feet, needs cues to keep the left foot in   05/22/24 Nustep level 5 x 8 minutes Standing weight shifts 2.5# marches 2.5# LAQ Red tband ankle PF/DF Yellow tband left hip adduction Red tband HS curls STM  to the upper traps, neck and rhomboids Gait with HHA and W/C follow x 90 feet  05/20/24 Nustep level 5 x 8 minutes Red tband hip adduction and abduction, cues needed for form and to decreased the compensation of the left shoulder as she tends to elevate the left shoulder with left leg motions 2.5# marches 2.5# LAQ Yellow tband left ankle PF/DF and did some eversion STM to the left neck and upper trap and some into the left rhomboid Gait with  HHA and W/C follow  30 feet and stopped due to left knee pain, then PT with some passive ROM and approximation and we walked another 70 feet, cues to keep foot narrow   05/15/24 Nustep level 5 x 8 minutes Manual resistance clamshells with a lot of cues to do the left hip Red tband HS curls Ball b/n knees squeeze 2.5# LAQ 2.5# marches STM to bilat UT and cervical spine some with thera-gun Approximation to L digits   Yellow tband ankle PF/DF Gait HHA with w/c follow 90 feet, stopped due to shortness of breath  PATIENT EDUCATION:  Education details: POC Person educated: Patient Education method: Explanation Education comprehension: verbalized understanding  HOME EXERCISE PROGRAM: 06/10/24  Marches, LAQ, ankle PF/DF, left hip adduction and abduction  ASSESSMENT:  CLINICAL IMPRESSION: Session completed sitting on sitting on mat table without back support for more encore engagement . More dynamic interventions in standing did cause some increase fatigue. Cues for full ROM needed wth LAQ and HS curls.      Patient is a 83 y.o. female who was seen today for physical therapy evaluation and treatment for debility,    she has not been able to walk or transfer or exercise much since D/C from PT in May.  I have seen her off and on over the past 20+ years and she is having a lot of difficulty controlling the left knee, it buckles and goes into varus and does some hyperextension, with walking 40 feet today she reported knee pain a 7/10,    OBJECTIVE IMPAIRMENTS: Abnormal gait, cardiopulmonary status limiting activity, decreased activity tolerance, decreased balance, decreased coordination, decreased endurance, decreased mobility, difficulty walking, decreased ROM, decreased strength, increased edema, impaired flexibility, improper body mechanics, postural dysfunction, and pain.   REHAB POTENTIAL: Good  CLINICAL DECISION MAKING: Stable/uncomplicated  EVALUATION COMPLEXITY: Low   GOALS: Goals  reviewed with patient? Yes  SHORT TERM GOALS: Target date: 05/28/24 Independent with advanced HEP Goal status: progressing as she has one caregive that can do this 05/20/24  LONG TERM GOALS: Target date: 08/04/24  Independent with advanced HEP with caregiver Goal status: ongoing 05/29/24  2.  Transfer with set up and CGA Goal status: ongoing 06/05/24  3.  Walk HHA x 150 feet Goal status: Progressing 05/22/24  4.  Increase left LE strength to 4/5 Goal status: ongoing 06/12/24  5.  Report neck and shoulder pain decreased 50% Goal status: ongoing 06/12/24  PLAN:  PT FREQUENCY: 2x/week  PT DURATION: 12 weeks  PLANNED INTERVENTIONS: Therapeutic exercises, Therapeutic activity, Neuromuscular re-education, Balance training, Gait training, Patient/Family education, Self Care, Joint mobilization, Moist heat, Taping, and Manual therapy  PLAN FOR NEXT SESSION: advance activities really work on stability of the left knee, hip adduction   Tanda KANDICE Sorrow, PTA 06/17/2024, 11:03 AM

## 2024-06-19 ENCOUNTER — Ambulatory Visit: Admitting: Physical Therapy

## 2024-06-19 ENCOUNTER — Encounter: Payer: Self-pay | Admitting: Physical Therapy

## 2024-06-19 DIAGNOSIS — I69354 Hemiplegia and hemiparesis following cerebral infarction affecting left non-dominant side: Secondary | ICD-10-CM | POA: Diagnosis not present

## 2024-06-19 DIAGNOSIS — R270 Ataxia, unspecified: Secondary | ICD-10-CM

## 2024-06-19 DIAGNOSIS — M25512 Pain in left shoulder: Secondary | ICD-10-CM | POA: Diagnosis not present

## 2024-06-19 DIAGNOSIS — R2681 Unsteadiness on feet: Secondary | ICD-10-CM | POA: Diagnosis not present

## 2024-06-19 DIAGNOSIS — R293 Abnormal posture: Secondary | ICD-10-CM

## 2024-06-19 DIAGNOSIS — I6930 Unspecified sequelae of cerebral infarction: Secondary | ICD-10-CM

## 2024-06-19 DIAGNOSIS — M6281 Muscle weakness (generalized): Secondary | ICD-10-CM | POA: Diagnosis not present

## 2024-06-19 DIAGNOSIS — R262 Difficulty in walking, not elsewhere classified: Secondary | ICD-10-CM | POA: Diagnosis not present

## 2024-06-19 DIAGNOSIS — M542 Cervicalgia: Secondary | ICD-10-CM

## 2024-06-19 DIAGNOSIS — M25572 Pain in left ankle and joints of left foot: Secondary | ICD-10-CM

## 2024-06-19 NOTE — Therapy (Signed)
 OUTPATIENT PHYSICAL THERAPY LOWER EXTREMITY TREATMENT    Patient Name: Karen Jackson MRN: [id number] DOB:09-Mar-1941, 83 y.o., female Today's Date: 06/19/2024  END OF SESSION:  PT End of Session - 06/19/24 1106     Visit Number 14    Date for Recertification  08/04/24    Authorization Type Medicare    PT Start Time 1100    PT Stop Time 1145    PT Time Calculation (min) 45 min    Activity Tolerance Patient tolerated treatment well    Behavior During Therapy [facility] for tasks assessed/performed           Past Medical History:  Diagnosis Date   Acute cystitis without hematuria    Acute diastolic CHF (congestive heart failure) (HCC)    Arthritis    Dyspnea    Dysrhythmia    Fever of unknown origin 03/19/2017   Hyperlipidemia    Hypertension    denies at preop   Multifocal pneumonia    Neuromuscular disorder (HCC)    neuropathy left arm and foot   Osteopenia    Paralysis (HCC)    partial left side from CVA    Persistent atrial fibrillation (HCC)    PONV (postoperative nausea and vomiting)    Pre-diabetes    Stroke [facility]) 2013   hemmorahgic   Past Surgical History:  Procedure Laterality Date   ANKLE SURGERY     APPENDECTOMY     CHOLECYSTECTOMY     HARDWARE REMOVAL Left 03/29/2023   Procedure: HARDWARE REMOVAL;  Surgeon: Kit Rush, MD;  Location: [facility];  Service: Orthopedics;  Laterality: Left;   HERNIA REPAIR     Esophagus   INCISION AND DRAINAGE OF WOUND Left 03/29/2023   Procedure: LEFT ANKLE WOUND IRRIGATION AND DEBRIDEMENT WOUND;  Surgeon: Kit Rush, MD;  Location: [facility];  Service: Orthopedics;  Laterality: Left;   JOINT REPLACEMENT     total- right partial- left   MASTECTOMY PARTIAL / LUMPECTOMY  2012   left   ORIF ANKLE FRACTURE Left 07/20/2018   Procedure: OPEN REDUCTION INTERNAL FIXATION (ORIF) ANKLE FRACTURE;  Surgeon: Kit Rush, MD;  Location: [facility];  Service: Orthopedics;  Laterality: Left;   TOTAL KNEE  ARTHROPLASTY Left 01/27/2019   Procedure: TOTAL KNEE ARTHROPLASTY;  Surgeon: Rubie Kemps, MD;  Location: [facility];  Service: Orthopedics;  Laterality: Left;   Patient Active Problem List   Diagnosis Date Noted   Senile osteoporosis 06/02/2024   Goals of care, counseling/discussion 01/17/2022   Grief 01/17/2022   Secondary hypercoagulable state 03/16/2021   Dizziness 03/10/2021   Orthostatic hypotension 10/15/2020   Presbycusis of both ears 03/08/2020   Mixed stress and urge urinary incontinence 12/02/2019   Macrocytosis 12/01/2019   Nutritional anemia 12/01/2019   S/P total knee replacement 01/27/2019   Recurrent left knee instability 07/05/2018   Respiratory failure with hypoxia (HCC) 08/30/2017   Hypoxemia    Heart failure with preserved ejection fraction (HCC), Grade 3 diastolic dysfunction 03/26/2017   PAF (paroxysmal atrial fibrillation) (HCC)    Dyspnea 03/19/2017   Encounter for preventive health examination 02/17/2016   Sensorineural hearing loss (SNHL), bilateral 01/26/2016   Hypomagnesemia 04/24/2014   Hemiparesis affecting left side as late effect of cerebrovascular accident (HCC) 04/24/2014   Nontraumatic cerebral hemorrhage (HCC) 04/30/2012   DM (diabetes mellitus) with complications (HCC) 03/04/2010   OSTEOPENIA 01/21/2009   UNSPECIFIED VITAMIN D  DEFICIENCY 11/19/2007   HYPERCHOLESTEROLEMIA 10/25/2006   GASTROESOPHAGEAL REFLUX, NO ESOPHAGITIS 10/25/2006  DIVERTICULOSIS OF COLON 10/25/2006   Osteoarthritis 10/25/2006   CERVICAL SPINE DISORDER, NOS 10/25/2006    PCP: Shayne, MD  REFERRING PROVIDER: Shayne, MD  REFERRING DIAG: debility  THERAPY DIAG:  Muscle weakness (generalized)  Hemiplegia and hemiparesis following cerebral infarction affecting left non-dominant side (HCC)  Unsteadiness on feet  Left shoulder pain, unspecified chronicity  Difficulty in walking, not elsewhere classified  Ataxia  Cervicalgia  Pain in left ankle and joints of left  foot  Late effects of CVA (cerebrovascular accident)  Abnormal posture  Rationale for Evaluation and Treatment: Rehabilitation  ONSET DATE: 04/29/24  SUBJECTIVE:   SUBJECTIVE STATEMENT: Able to transfer pretty good recently  Patient has been a patient of mine off and on for many years, she has had many different diagnoses, she had a CVA about 13 years ago, she had a left ankle fracture with ORIF, she had a lot of issues with pain and wounds, so the hardware was removed last August, she has done pretty well but tends to back slide without PT with her ability to walk and transfer.  She has a caregiver about 11 hours a day.  She has been able to walk with me in PT, no one else has walked with her   PERTINENT HISTORY: CVA, CHF, HTN, A-fib, TKA PAIN:  Are you having pain? Yes: NPRS scale: Just soreness/10 Pain location: left upper trap and neck, left ankle and knee  Pain description: ache sore Aggravating factors: neck and shoulder always hurt, knee and ankle hurt with transfers Relieving factors: massage, heat  PRECAUTIONS: None  RED FLAGS: None   WEIGHT BEARING RESTRICTIONS: No  FALLS:  Has patient fallen in last 6 months? No  LIVING ENVIRONMENT: Lives with: lives alone Lives in: House/apartment Stairs: No Has following equipment at home: Environmental consultant - 2 wheeled, Wheelchair (manual), shower chair, bed side commode, Grab bars, and Ramped entry care giver 11 hours a day  OCCUPATION: retired  PLOF: Needs assistance with homemaking, Needs assistance with transfers, and Leisure: plays bridge, she has 11 hours of an aide at home that helps with meals, going to MD's, dressing and bathing, is alone at night  PATIENT GOALS: walk, have less pain, transfer without difficulty  NEXT MD VISIT: none scheduled  OBJECTIVE:   DIAGNOSTIC FINDINGS: none performed  COGNITION: Overall cognitive status: Within functional limits for tasks assessed     SENSATION: WFL POSTURE: rounded  shoulders and forward head  PALPATION: Left knee and ankle are swollen and tender, left upper and neck  LOWER EXTREMITY ROM:  Active ROM Left eval Left 05/15/24  Hip flexion    Hip extension    Hip abduction    Hip adduction    Hip internal rotation    Hip external rotation    Knee flexion 90 91  Knee extension 0 0  Ankle dorsiflexion 5   Ankle plantarflexion 30   Ankle inversion 5   Ankle eversion 0    (Blank rows = not tested)  LOWER EXTREMITY MMT:  MMT Right eval Left eval  Hip flexion 3+ 3+  Hip extension    Hip abduction 4- 3+  Hip adduction 4- 3+  Hip internal rotation    Hip external rotation    Knee flexion 4- 3+  Knee extension 4- 3+  Ankle dorsiflexion  3+  Ankle plantarflexion    Ankle inversion    Ankle eversion     (Blank rows = not tested)  FUNCTIONAL TESTS:  Transfers Max A, left knee  gives out and goes into ER and tends to bow into varus, has not walked since I discharged her 12/27/23.    GAIT: Distance walked: with HHA and w/c follow x 40 feet with pain in the left knee a 7/10, she tends to rotate the left hip posterior and really bring the left knee into extension   TODAY'S TREATMENT:                                                                                                                              DATE:  06/19/24 Nustep level 5 x 9 minutes Gait HHA with w/c follow 110 feet 5# LAQ 5# marches Red tband left hip adduction Red tband left hip abduction STM to the left upper trap and neck Gait again made it 80 feet and had to stop due to fatigue  06/17/24 Nustep level 4 x 8 minutes Standing march  Side steps at mat  HS curls red 2x10 LAQ 3lb 2x10 STM to UT and cervical spine  Some with Tgun Gait 75ft HHA and W/c follow x 90 feet  06/12/24 Nustep level 5 x 8 minutes Supine bridges Supine rotations with knees tied together with red tband and feet on ball then bridges with legs on ball Isometric abs with ball Use of sliding  board hip abduction and hip/knee flexion Red tband left hip adduction in hooklying Blue tband left hip extension in supine Gait with HHA and W/c follow x 90 feet  06/10/24 Nustep level 5 x 8 minutesm cues to keep left knee in STM to the left upper trap, neck and rhomboid 4# LAQ 4# marches Red tband HS curls Red tbnd ankle PF/DF Yellow tband with her really focused on the left only hip adduction and abduction Gait with HHA and W/C follow 2 x75 feet  06/05/24 Nustep level 5 x 8 minutes a lot of cues to keep left knee in STM to the left upper trap and the rhomboid and into the neck Gait with HHA and w/c follow 90 feet some medial left knee pain 3# MArches 3# LAQ Red tband left hip adduction Red tband HS curls Weight 145# with 2 person assist to get on scale Gait with HHA and w/c follow x100 feet  06/03/24 Nustep level 5 x 8 minutes Gait HHA with W/C follow 80 feet 3# marches 3# LAQ Red tband HS curls Red tband left hip adduction Red tband left hip abduction Red tband left ankle PF/DF Standing weight shifts Gait HHA with w/c follow 80 feet  05/29/24 Nustep level 5 x 8 minutes STM to the left upper trap, neck and rhomboids Gait with HHA and w/c follow x 80 feet 3# marches 3# LAQ Red tband HS curls Red ankle PF/DF Yellow left hip abd/adduction some pain with adduction Gait as noted above again 60 feet, this time limited by fatigue and SOB   05/27/24 Nustep level 5 x 8 minutes Standing weight shifts STM to the  left upper trap, rhomboid and neck 3# LAQ 3# marches Blue tband HS curls some medial left knee pain Yellow tband left hip adduction Green tband clamshells with cues for the left hip Gait with HHA and w/c follow x80 feet, needs cues to keep the left foot in   05/22/24 Nustep level 5 x 8 minutes Standing weight shifts 2.5# marches 2.5# LAQ Red tband ankle PF/DF Yellow tband left hip adduction Red tband HS curls STM to the upper traps, neck and  rhomboids Gait with HHA and W/C follow x 90 feet  05/20/24 Nustep level 5 x 8 minutes Red tband hip adduction and abduction, cues needed for form and to decreased the compensation of the left shoulder as she tends to elevate the left shoulder with left leg motions 2.5# marches 2.5# LAQ Yellow tband left ankle PF/DF and did some eversion STM to the left neck and upper trap and some into the left rhomboid Gait with HHA and W/C follow  30 feet and stopped due to left knee pain, then PT with some passive ROM and approximation and we walked another 70 feet, cues to keep foot narrow   05/15/24 Nustep level 5 x 8 minutes Manual resistance clamshells with a lot of cues to do the left hip Red tband HS curls Ball b/n knees squeeze 2.5# LAQ 2.5# marches STM to bilat UT and cervical spine some with thera-gun Approximation to L digits   Yellow tband ankle PF/DF Gait HHA with w/c follow 90 feet, stopped due to shortness of breath  PATIENT EDUCATION:  Education details: POC Person educated: Patient Education method: Explanation Education comprehension: verbalized understanding  HOME EXERCISE PROGRAM: 06/10/24  Marches, LAQ, ankle PF/DF, left hip adduction and abduction  ASSESSMENT:  CLINICAL IMPRESSION: I had her isolate the left leg today with hip abduction and adduction so she would concentrate on this  as she gets distracted easily and if doing both at the same time will forget about the left side.  She was able to walk her farthest today but started out with a little bit of a left leg buckling, we then were able to go again but was stopped due to fatigue      Patient is a 83 y.o. female who was seen today for physical therapy evaluation and treatment for debility,    she has not been able to walk or transfer or exercise much since D/C from PT in May.  I have seen her off and on over the past 20+ years and she is having a lot of difficulty controlling the left knee, it buckles and goes into  varus and does some hyperextension, with walking 40 feet today she reported knee pain a 7/10,    OBJECTIVE IMPAIRMENTS: Abnormal gait, cardiopulmonary status limiting activity, decreased activity tolerance, decreased balance, decreased coordination, decreased endurance, decreased mobility, difficulty walking, decreased ROM, decreased strength, increased edema, impaired flexibility, improper body mechanics, postural dysfunction, and pain.   REHAB POTENTIAL: Good  CLINICAL DECISION MAKING: Stable/uncomplicated  EVALUATION COMPLEXITY: Low   GOALS: Goals reviewed with patient? Yes  SHORT TERM GOALS: Target date: 05/28/24 Independent with advanced HEP Goal status: progressing as she has one caregive that can do this 05/20/24  LONG TERM GOALS: Target date: 08/04/24  Independent with advanced HEP with caregiver Goal status: ongoing 05/29/24  2.  Transfer with set up and CGA Goal status: ongoing 06/19/24  3.  Walk HHA x 150 feet Goal status: Progressing 06/19/24  4.  Increase left LE strength  to 4/5 Goal status: ongoing 06/12/24  5.  Report neck and shoulder pain decreased 50% Goal status: ongoing 06/12/24  PLAN:  PT FREQUENCY: 2x/week  PT DURATION: 12 weeks  PLANNED INTERVENTIONS: Therapeutic exercises, Therapeutic activity, Neuromuscular re-education, Balance training, Gait training, Patient/Family education, Self Care, Joint mobilization, Moist heat, Taping, and Manual therapy  PLAN FOR NEXT SESSION: advance activities really work on stability of the left knee, hip adduction   Nimai Burbach W, PT 06/19/2024, 11:06 AM

## 2024-06-24 ENCOUNTER — Ambulatory Visit: Admitting: Physical Therapy

## 2024-06-24 ENCOUNTER — Encounter: Payer: Self-pay | Admitting: Physical Therapy

## 2024-06-24 DIAGNOSIS — I69354 Hemiplegia and hemiparesis following cerebral infarction affecting left non-dominant side: Secondary | ICD-10-CM | POA: Diagnosis not present

## 2024-06-24 DIAGNOSIS — R2681 Unsteadiness on feet: Secondary | ICD-10-CM | POA: Diagnosis not present

## 2024-06-24 DIAGNOSIS — M25512 Pain in left shoulder: Secondary | ICD-10-CM | POA: Diagnosis not present

## 2024-06-24 DIAGNOSIS — I6930 Unspecified sequelae of cerebral infarction: Secondary | ICD-10-CM

## 2024-06-24 DIAGNOSIS — M25572 Pain in left ankle and joints of left foot: Secondary | ICD-10-CM

## 2024-06-24 DIAGNOSIS — R270 Ataxia, unspecified: Secondary | ICD-10-CM

## 2024-06-24 DIAGNOSIS — M542 Cervicalgia: Secondary | ICD-10-CM

## 2024-06-24 DIAGNOSIS — M6281 Muscle weakness (generalized): Secondary | ICD-10-CM

## 2024-06-24 DIAGNOSIS — R262 Difficulty in walking, not elsewhere classified: Secondary | ICD-10-CM | POA: Diagnosis not present

## 2024-06-24 NOTE — Therapy (Signed)
 OUTPATIENT PHYSICAL THERAPY LOWER EXTREMITY TREATMENT    Patient Name: Karen Jackson MRN: [id number] DOB:13-Aug-1941, 83 y.o., female Today's Date: 06/24/2024  END OF SESSION:  PT End of Session - 06/24/24 1059     Visit Number 15    Authorization Type Medicare    PT Start Time 1058    PT Stop Time 1144    PT Time Calculation (min) 46 min    Activity Tolerance Patient tolerated treatment well    Behavior During Therapy WFL for tasks assessed/performed           Past Medical History:  Diagnosis Date   Acute cystitis without hematuria    Acute diastolic CHF (congestive heart failure) (HCC)    Arthritis    Dyspnea    Dysrhythmia    Fever of unknown origin 03/19/2017   Hyperlipidemia    Hypertension    denies at preop   Multifocal pneumonia    Neuromuscular disorder (HCC)    neuropathy left arm and foot   Osteopenia    Paralysis (HCC)    partial left side from CVA    Persistent atrial fibrillation (HCC)    PONV (postoperative nausea and vomiting)    Pre-diabetes    Stroke [facility]) 2013   hemmorahgic   Past Surgical History:  Procedure Laterality Date   ANKLE SURGERY     APPENDECTOMY     CHOLECYSTECTOMY     HARDWARE REMOVAL Left 03/29/2023   Procedure: HARDWARE REMOVAL;  Surgeon: Kit Rush, MD;  Location: [facility];  Service: Orthopedics;  Laterality: Left;   HERNIA REPAIR     Esophagus   INCISION AND DRAINAGE OF WOUND Left 03/29/2023   Procedure: LEFT ANKLE WOUND IRRIGATION AND DEBRIDEMENT WOUND;  Surgeon: Kit Rush, MD;  Location: [facility];  Service: Orthopedics;  Laterality: Left;   JOINT REPLACEMENT     total- right partial- left   MASTECTOMY PARTIAL / LUMPECTOMY  2012   left   ORIF ANKLE FRACTURE Left 07/20/2018   Procedure: OPEN REDUCTION INTERNAL FIXATION (ORIF) ANKLE FRACTURE;  Surgeon: Kit Rush, MD;  Location: [facility];  Service: Orthopedics;  Laterality: Left;   TOTAL KNEE ARTHROPLASTY Left 01/27/2019   Procedure:  TOTAL KNEE ARTHROPLASTY;  Surgeon: Rubie Kemps, MD;  Location: [facility];  Service: Orthopedics;  Laterality: Left;   Patient Active Problem List   Diagnosis Date Noted   Senile osteoporosis 06/02/2024   Goals of care, counseling/discussion 01/17/2022   Grief 01/17/2022   Secondary hypercoagulable state 03/16/2021   Dizziness 03/10/2021   Orthostatic hypotension 10/15/2020   Presbycusis of both ears 03/08/2020   Mixed stress and urge urinary incontinence 12/02/2019   Macrocytosis 12/01/2019   Nutritional anemia 12/01/2019   S/P total knee replacement 01/27/2019   Recurrent left knee instability 07/05/2018   Respiratory failure with hypoxia (HCC) 08/30/2017   Hypoxemia    Heart failure with preserved ejection fraction (HCC), Grade 3 diastolic dysfunction 03/26/2017   PAF (paroxysmal atrial fibrillation) (HCC)    Dyspnea 03/19/2017   Encounter for preventive health examination 02/17/2016   Sensorineural hearing loss (SNHL), bilateral 01/26/2016   Hypomagnesemia 04/24/2014   Hemiparesis affecting left side as late effect of cerebrovascular accident (HCC) 04/24/2014   Nontraumatic cerebral hemorrhage (HCC) 04/30/2012   DM (diabetes mellitus) with complications (HCC) 03/04/2010   OSTEOPENIA 01/21/2009   UNSPECIFIED VITAMIN D  DEFICIENCY 11/19/2007   HYPERCHOLESTEROLEMIA 10/25/2006   GASTROESOPHAGEAL REFLUX, NO ESOPHAGITIS 10/25/2006   DIVERTICULOSIS OF COLON 10/25/2006   Osteoarthritis 10/25/2006  CERVICAL SPINE DISORDER, NOS 10/25/2006    PCP: Shayne, MD  REFERRING PROVIDER: Shayne, MD  REFERRING DIAG: debility  THERAPY DIAG:  Muscle weakness (generalized)  Hemiplegia and hemiparesis following cerebral infarction affecting left non-dominant side (HCC)  Unsteadiness on feet  Left shoulder pain, unspecified chronicity  Difficulty in walking, not elsewhere classified  Ataxia  Cervicalgia  Pain in left ankle and joints of left foot  Late effects of CVA  (cerebrovascular accident)  Rationale for Evaluation and Treatment: Rehabilitation  ONSET DATE: 04/29/24  SUBJECTIVE:   SUBJECTIVE STATEMENT: Able to transfer pretty good recently. No problems  Patient has been a patient of mine off and on for many years, she has had many different diagnoses, she had a CVA about 13 years ago, she had a left ankle fracture with ORIF, she had a lot of issues with pain and wounds, so the hardware was removed last August, she has done pretty well but tends to back slide without PT with her ability to walk and transfer.  She has a caregiver about 11 hours a day.  She has been able to walk with me in PT, no one else has walked with her   PERTINENT HISTORY: CVA, CHF, HTN, A-fib, TKA PAIN:  Are you having pain? Yes: NPRS scale: Just soreness/10 Pain location: left upper trap and neck, left ankle and knee  Pain description: ache sore Aggravating factors: neck and shoulder always hurt, knee and ankle hurt with transfers Relieving factors: massage, heat  PRECAUTIONS: None  RED FLAGS: None   WEIGHT BEARING RESTRICTIONS: No  FALLS:  Has patient fallen in last 6 months? No  LIVING ENVIRONMENT: Lives with: lives alone Lives in: House/apartment Stairs: No Has following equipment at home: Environmental Consultant - 2 wheeled, Wheelchair (manual), shower chair, bed side commode, Grab bars, and Ramped entry care giver 11 hours a day  OCCUPATION: retired  PLOF: Needs assistance with homemaking, Needs assistance with transfers, and Leisure: plays bridge, she has 11 hours of an aide at home that helps with meals, going to MD's, dressing and bathing, is alone at night  PATIENT GOALS: walk, have less pain, transfer without difficulty  NEXT MD VISIT: none scheduled  OBJECTIVE:   DIAGNOSTIC FINDINGS: none performed  COGNITION: Overall cognitive status: Within functional limits for tasks assessed     SENSATION: WFL POSTURE: rounded shoulders and forward  head  PALPATION: Left knee and ankle are swollen and tender, left upper and neck  LOWER EXTREMITY ROM:  Active ROM Left eval Left 05/15/24  Hip flexion    Hip extension    Hip abduction    Hip adduction    Hip internal rotation    Hip external rotation    Knee flexion 90 91  Knee extension 0 0  Ankle dorsiflexion 5   Ankle plantarflexion 30   Ankle inversion 5   Ankle eversion 0    (Blank rows = not tested)  LOWER EXTREMITY MMT:  MMT Right eval Left eval  Hip flexion 3+ 3+  Hip extension    Hip abduction 4- 3+  Hip adduction 4- 3+  Hip internal rotation    Hip external rotation    Knee flexion 4- 3+  Knee extension 4- 3+  Ankle dorsiflexion  3+  Ankle plantarflexion    Ankle inversion    Ankle eversion     (Blank rows = not tested)  FUNCTIONAL TESTS:  Transfers Max A, left knee gives out and goes into ER and tends to bow into  varus, has not walked since I discharged her 12/27/23.    GAIT: Distance walked: with HHA and w/c follow x 40 feet with pain in the left knee a 7/10, she tends to rotate the left hip posterior and really bring the left knee into extension   TODAY'S TREATMENT:                                                                                                                              DATE:  06/24/24 Nustep level 5 x 8 minutes Seated partial sit ups Seated elbow touches each side 2# marches unsupported back 2# LAQ unsupported back 2 toe touches with HHA STM to the left upper trap and neck Unsupported sitting ball bounce Gait with HHA and w/c follow 2x90 feet  06/19/24 Nustep level 5 x 9 minutes Gait HHA with w/c follow 110 feet 5# LAQ 5# marches Red tband left hip adduction Red tband left hip abduction STM to the left upper trap and neck Gait again made it 80 feet and had to stop due to fatigue  06/17/24 Nustep level 4 x 8 minutes Standing march  Side steps at mat  HS curls red 2x10 LAQ 3lb 2x10 STM to UT and cervical  spine  Some with Tgun Gait 58ft HHA and W/c follow x 90 feet  06/12/24 Nustep level 5 x 8 minutes Supine bridges Supine rotations with knees tied together with red tband and feet on ball then bridges with legs on ball Isometric abs with ball Use of sliding board hip abduction and hip/knee flexion Red tband left hip adduction in hooklying Blue tband left hip extension in supine Gait with HHA and W/c follow x 90 feet  06/10/24 Nustep level 5 x 8 minutesm cues to keep left knee in STM to the left upper trap, neck and rhomboid 4# LAQ 4# marches Red tband HS curls Red tbnd ankle PF/DF Yellow tband with her really focused on the left only hip adduction and abduction Gait with HHA and W/C follow 2 x75 feet  06/05/24 Nustep level 5 x 8 minutes a lot of cues to keep left knee in STM to the left upper trap and the rhomboid and into the neck Gait with HHA and w/c follow 90 feet some medial left knee pain 3# MArches 3# LAQ Red tband left hip adduction Red tband HS curls Weight 145# with 2 person assist to get on scale Gait with HHA and w/c follow x100 feet  06/03/24 Nustep level 5 x 8 minutes Gait HHA with W/C follow 80 feet 3# marches 3# LAQ Red tband HS curls Red tband left hip adduction Red tband left hip abduction Red tband left ankle PF/DF Standing weight shifts Gait HHA with w/c follow 80 feet  05/29/24 Nustep level 5 x 8 minutes STM to the left upper trap, neck and rhomboids Gait with HHA and w/c follow x 80 feet 3# marches 3# LAQ Red tband HS curls Red  ankle PF/DF Yellow left hip abd/adduction some pain with adduction Gait as noted above again 60 feet, this time limited by fatigue and SOB   05/27/24 Nustep level 5 x 8 minutes Standing weight shifts STM to the left upper trap, rhomboid and neck 3# LAQ 3# marches Blue tband HS curls some medial left knee pain Yellow tband left hip adduction Green tband clamshells with cues for the left hip Gait with HHA and  w/c follow x80 feet, needs cues to keep the left foot in   05/22/24 Nustep level 5 x 8 minutes Standing weight shifts 2.5# marches 2.5# LAQ Red tband ankle PF/DF Yellow tband left hip adduction Red tband HS curls STM to the upper traps, neck and rhomboids Gait with HHA and W/C follow x 90 feet  05/20/24 Nustep level 5 x 8 minutes Red tband hip adduction and abduction, cues needed for form and to decreased the compensation of the left shoulder as she tends to elevate the left shoulder with left leg motions 2.5# marches 2.5# LAQ Yellow tband left ankle PF/DF and did some eversion STM to the left neck and upper trap and some into the left rhomboid Gait with HHA and W/C follow  30 feet and stopped due to left knee pain, then PT with some passive ROM and approximation and we walked another 70 feet, cues to keep foot narrow  PATIENT EDUCATION:  Education details: POC Person educated: Patient Education method: Explanation Education comprehension: verbalized understanding  HOME EXERCISE PROGRAM: 06/10/24  Marches, LAQ, ankle PF/DF, left hip adduction and abduction  ASSESSMENT:  CLINICAL IMPRESSION: I did sitting unsupported exercises today to increase some core activation, she did struggle with this so went down in weight, she has a difficult time going to the left to touch the left elbow to the sitting surface      Patient is a 83 y.o. female who was seen today for physical therapy evaluation and treatment for debility,    she has not been able to walk or transfer or exercise much since D/C from PT in May.  I have seen her off and on over the past 20+ years and she is having a lot of difficulty controlling the left knee, it buckles and goes into varus and does some hyperextension, with walking 40 feet today she reported knee pain a 7/10,    OBJECTIVE IMPAIRMENTS: Abnormal gait, cardiopulmonary status limiting activity, decreased activity tolerance, decreased balance, decreased  coordination, decreased endurance, decreased mobility, difficulty walking, decreased ROM, decreased strength, increased edema, impaired flexibility, improper body mechanics, postural dysfunction, and pain.   REHAB POTENTIAL: Good  CLINICAL DECISION MAKING: Stable/uncomplicated  EVALUATION COMPLEXITY: Low   GOALS: Goals reviewed with patient? Yes  SHORT TERM GOALS: Target date: 05/28/24 Independent with advanced HEP Goal status: progressing as she has one caregive that can do this 05/20/24  LONG TERM GOALS: Target date: 08/04/24  Independent with advanced HEP with caregiver Goal status: ongoing 06/24/24  2.  Transfer with set up and CGA Goal status: ongoing 06/19/24  3.  Walk HHA x 150 feet Goal status: Progressing 06/24/24  4.  Increase left LE strength to 4/5 Goal status: ongoing 06/12/24  5.  Report neck and shoulder pain decreased 50% Goal status: ongoing 06/12/24  PLAN:  PT FREQUENCY: 2x/week  PT DURATION: 12 weeks  PLANNED INTERVENTIONS: Therapeutic exercises, Therapeutic activity, Neuromuscular re-education, Balance training, Gait training, Patient/Family education, Self Care, Joint mobilization, Moist heat, Taping, and Manual therapy  PLAN FOR NEXT SESSION: advance  activities really work on stability of the left knee, hip adduction   Torin Whisner W, PT 06/24/2024, 11:00 AM

## 2024-06-25 DIAGNOSIS — R82998 Other abnormal findings in urine: Secondary | ICD-10-CM | POA: Diagnosis not present

## 2024-06-25 DIAGNOSIS — E1159 Type 2 diabetes mellitus with other circulatory complications: Secondary | ICD-10-CM | POA: Diagnosis not present

## 2024-06-25 DIAGNOSIS — I10 Essential (primary) hypertension: Secondary | ICD-10-CM | POA: Diagnosis not present

## 2024-06-26 ENCOUNTER — Ambulatory Visit: Admitting: Physical Therapy

## 2024-06-30 NOTE — Therapy (Signed)
 OUTPATIENT PHYSICAL THERAPY LOWER EXTREMITY TREATMENT    Patient Name: Karen Jackson MRN: [id number] DOB:1941-04-08, 83 y.o., female Today's Date: 07/01/2024  END OF SESSION:  PT End of Session - 07/01/24 1020     Visit Number 16    Authorization Type Medicare    PT Start Time 1015    PT Stop Time 1100    PT Time Calculation (min) 45 min    Activity Tolerance Patient tolerated treatment well    Behavior During Therapy WFL for tasks assessed/performed            Past Medical History:  Diagnosis Date   Acute cystitis without hematuria    Acute diastolic CHF (congestive heart failure) (HCC)    Arthritis    Dyspnea    Dysrhythmia    Fever of unknown origin 03/19/2017   Hyperlipidemia    Hypertension    denies at preop   Multifocal pneumonia    Neuromuscular disorder (HCC)    neuropathy left arm and foot   Osteopenia    Paralysis (HCC)    partial left side from CVA    Persistent atrial fibrillation (HCC)    PONV (postoperative nausea and vomiting)    Pre-diabetes    Stroke [facility]) 2013   hemmorahgic   Past Surgical History:  Procedure Laterality Date   ANKLE SURGERY     APPENDECTOMY     CHOLECYSTECTOMY     HARDWARE REMOVAL Left 03/29/2023   Procedure: HARDWARE REMOVAL;  Surgeon: Kit Rush, MD;  Location: [facility];  Service: Orthopedics;  Laterality: Left;   HERNIA REPAIR     Esophagus   INCISION AND DRAINAGE OF WOUND Left 03/29/2023   Procedure: LEFT ANKLE WOUND IRRIGATION AND DEBRIDEMENT WOUND;  Surgeon: Kit Rush, MD;  Location:  SURGERY CENTER;  Service: Orthopedics;  Laterality: Left;   JOINT REPLACEMENT     total- right partial- left   MASTECTOMY PARTIAL / LUMPECTOMY  2012   left   ORIF ANKLE FRACTURE Left 07/20/2018   Procedure: OPEN REDUCTION INTERNAL FIXATION (ORIF) ANKLE FRACTURE;  Surgeon: Kit Rush, MD;  Location: [facility] OR;  Service: Orthopedics;  Laterality: Left;   TOTAL KNEE ARTHROPLASTY Left 01/27/2019   Procedure:  TOTAL KNEE ARTHROPLASTY;  Surgeon: Rubie Kemps, MD;  Location: [facility];  Service: Orthopedics;  Laterality: Left;   Patient Active Problem List   Diagnosis Date Noted   Senile osteoporosis 06/02/2024   Goals of care, counseling/discussion 01/17/2022   Grief 01/17/2022   Secondary hypercoagulable state 03/16/2021   Dizziness 03/10/2021   Orthostatic hypotension 10/15/2020   Presbycusis of both ears 03/08/2020   Mixed stress and urge urinary incontinence 12/02/2019   Macrocytosis 12/01/2019   Nutritional anemia 12/01/2019   S/P total knee replacement 01/27/2019   Recurrent left knee instability 07/05/2018   Respiratory failure with hypoxia (HCC) 08/30/2017   Hypoxemia    Heart failure with preserved ejection fraction (HCC), Grade 3 diastolic dysfunction 03/26/2017   PAF (paroxysmal atrial fibrillation) (HCC)    Dyspnea 03/19/2017   Encounter for preventive health examination 02/17/2016   Sensorineural hearing loss (SNHL), bilateral 01/26/2016   Hypomagnesemia 04/24/2014   Hemiparesis affecting left side as late effect of cerebrovascular accident (HCC) 04/24/2014   Nontraumatic cerebral hemorrhage (HCC) 04/30/2012   DM (diabetes mellitus) with complications (HCC) 03/04/2010   OSTEOPENIA 01/21/2009   UNSPECIFIED VITAMIN D  DEFICIENCY 11/19/2007   HYPERCHOLESTEROLEMIA 10/25/2006   GASTROESOPHAGEAL REFLUX, NO ESOPHAGITIS 10/25/2006   DIVERTICULOSIS OF COLON 10/25/2006   Osteoarthritis  10/25/2006   CERVICAL SPINE DISORDER, NOS 10/25/2006    PCP: Shayne, MD  REFERRING PROVIDER: Shayne, MD  REFERRING DIAG: debility  THERAPY DIAG:  Muscle weakness (generalized)  Hemiplegia and hemiparesis following cerebral infarction affecting left non-dominant side (HCC)  Unsteadiness on feet  Left shoulder pain, unspecified chronicity  Difficulty in walking, not elsewhere classified  Rationale for Evaluation and Treatment: Rehabilitation  ONSET DATE: 04/29/24  SUBJECTIVE:   SUBJECTIVE  STATEMENT: Doing fine.   Patient has been a patient of mine off and on for many years, she has had many different diagnoses, she had a CVA about 13 years ago, she had a left ankle fracture with ORIF, she had a lot of issues with pain and wounds, so the hardware was removed last August, she has done pretty well but tends to back slide without PT with her ability to walk and transfer.  She has a caregiver about 11 hours a day.  She has been able to walk with me in PT, no one else has walked with her   PERTINENT HISTORY: CVA, CHF, HTN, A-fib, TKA PAIN:  Are you having pain? Yes: NPRS scale: Just soreness/10 Pain location: left upper trap and neck, left ankle and knee  Pain description: ache sore Aggravating factors: neck and shoulder always hurt, knee and ankle hurt with transfers Relieving factors: massage, heat  PRECAUTIONS: None  RED FLAGS: None   WEIGHT BEARING RESTRICTIONS: No  FALLS:  Has patient fallen in last 6 months? No  LIVING ENVIRONMENT: Lives with: lives alone Lives in: House/apartment Stairs: No Has following equipment at home: Environmental Consultant - 2 wheeled, Wheelchair (manual), shower chair, bed side commode, Grab bars, and Ramped entry care giver 11 hours a day  OCCUPATION: retired  PLOF: Needs assistance with homemaking, Needs assistance with transfers, and Leisure: plays bridge, she has 11 hours of an aide at home that helps with meals, going to MD's, dressing and bathing, is alone at night  PATIENT GOALS: walk, have less pain, transfer without difficulty  NEXT MD VISIT: none scheduled  OBJECTIVE:   DIAGNOSTIC FINDINGS: none performed  COGNITION: Overall cognitive status: Within functional limits for tasks assessed     SENSATION: WFL POSTURE: rounded shoulders and forward head  PALPATION: Left knee and ankle are swollen and tender, left upper and neck  LOWER EXTREMITY ROM:  Active ROM Left eval Left 05/15/24  Hip flexion    Hip extension    Hip  abduction    Hip adduction    Hip internal rotation    Hip external rotation    Knee flexion 90 91  Knee extension 0 0  Ankle dorsiflexion 5   Ankle plantarflexion 30   Ankle inversion 5   Ankle eversion 0    (Blank rows = not tested)  LOWER EXTREMITY MMT:  MMT Right eval Left eval  Hip flexion 3+ 3+  Hip extension    Hip abduction 4- 3+  Hip adduction 4- 3+  Hip internal rotation    Hip external rotation    Knee flexion 4- 3+  Knee extension 4- 3+  Ankle dorsiflexion  3+  Ankle plantarflexion    Ankle inversion    Ankle eversion     (Blank rows = not tested)  FUNCTIONAL TESTS:  Transfers Max A, left knee gives out and goes into ER and tends to bow into varus, has not walked since I discharged her 12/27/23.    GAIT: Distance walked: with HHA and w/c follow x 40 feet  with pain in the left knee a 7/10, she tends to rotate the left hip posterior and really bring the left knee into extension   TODAY'S TREATMENT:                                                                                                                              DATE:  06/30/24 NuStep L5x  3# marches  3# LAQ 2x15 Red band HS curls 2x10 STM to upper trap and neck Gait with HHA and W/C follow 2x64ft  06/24/24 Nustep level 5 x 8 minutes Seated partial sit ups Seated elbow touches each side 2# marches unsupported back 2# LAQ unsupported back 2 toe touches with HHA STM to the left upper trap and neck Unsupported sitting ball bounce Gait with HHA and w/c follow 2x90 feet  06/19/24 Nustep level 5 x 9 minutes Gait HHA with w/c follow 110 feet 5# LAQ 5# marches Red tband left hip adduction Red tband left hip abduction STM to the left upper trap and neck Gait again made it 80 feet and had to stop due to fatigue  06/17/24 Nustep level 4 x 8 minutes Standing march  Side steps at mat  HS curls red 2x10 LAQ 3lb 2x10 STM to UT and cervical spine  Some with Tgun Gait 19ft HHA and W/c  follow x 90 feet  06/12/24 Nustep level 5 x 8 minutes Supine bridges Supine rotations with knees tied together with red tband and feet on ball then bridges with legs on ball Isometric abs with ball Use of sliding board hip abduction and hip/knee flexion Red tband left hip adduction in hooklying Blue tband left hip extension in supine Gait with HHA and W/c follow x 90 feet  06/10/24 Nustep level 5 x 8 minutesm cues to keep left knee in STM to the left upper trap, neck and rhomboid 4# LAQ 4# marches Red tband HS curls Red tbnd ankle PF/DF Yellow tband with her really focused on the left only hip adduction and abduction Gait with HHA and W/C follow 2 x75 feet  06/05/24 Nustep level 5 x 8 minutes a lot of cues to keep left knee in STM to the left upper trap and the rhomboid and into the neck Gait with HHA and w/c follow 90 feet some medial left knee pain 3# MArches 3# LAQ Red tband left hip adduction Red tband HS curls Weight 145# with 2 person assist to get on scale Gait with HHA and w/c follow x100 feet  06/03/24 Nustep level 5 x 8 minutes Gait HHA with W/C follow 80 feet 3# marches 3# LAQ Red tband HS curls Red tband left hip adduction Red tband left hip abduction Red tband left ankle PF/DF Standing weight shifts Gait HHA with w/c follow 80 feet  05/29/24 Nustep level 5 x 8 minutes STM to the left upper trap, neck and rhomboids Gait with HHA and w/c follow x 80 feet 3# marches  3# LAQ Red tband HS curls Red ankle PF/DF Yellow left hip abd/adduction some pain with adduction Gait as noted above again 60 feet, this time limited by fatigue and SOB   05/27/24 Nustep level 5 x 8 minutes Standing weight shifts STM to the left upper trap, rhomboid and neck 3# LAQ 3# marches Blue tband HS curls some medial left knee pain Yellow tband left hip adduction Green tband clamshells with cues for the left hip Gait with HHA and w/c follow x80 feet, needs cues to keep the  left foot in   05/22/24 Nustep level 5 x 8 minutes Standing weight shifts 2.5# marches 2.5# LAQ Red tband ankle PF/DF Yellow tband left hip adduction Red tband HS curls STM to the upper traps, neck and rhomboids Gait with HHA and W/C follow x 90 feet  05/20/24 Nustep level 5 x 8 minutes Red tband hip adduction and abduction, cues needed for form and to decreased the compensation of the left shoulder as she tends to elevate the left shoulder with left leg motions 2.5# marches 2.5# LAQ Yellow tband left ankle PF/DF and did some eversion STM to the left neck and upper trap and some into the left rhomboid Gait with HHA and W/C follow  30 feet and stopped due to left knee pain, then PT with some passive ROM and approximation and we walked another 70 feet, cues to keep foot narrow  PATIENT EDUCATION:  Education details: POC Person educated: Patient Education method: Explanation Education comprehension: verbalized understanding  HOME EXERCISE PROGRAM: 06/10/24  Marches, LAQ, ankle PF/DF, left hip adduction and abduction  ASSESSMENT:  CLINICAL IMPRESSION: Patient returns with no changes, continued to work mostly on seated exercises for LE's. She was not here last week due to being sick, so had not walked in a week. Therefore, she had a little bit more difficulty clearing her L foot and needed a rest break half way through walk.       Patient is a 83 y.o. female who was seen today for physical therapy evaluation and treatment for debility,    she has not been able to walk or transfer or exercise much since D/C from PT in May.  I have seen her off and on over the past 20+ years and she is having a lot of difficulty controlling the left knee, it buckles and goes into varus and does some hyperextension, with walking 40 feet today she reported knee pain a 7/10,    OBJECTIVE IMPAIRMENTS: Abnormal gait, cardiopulmonary status limiting activity, decreased activity tolerance, decreased balance,  decreased coordination, decreased endurance, decreased mobility, difficulty walking, decreased ROM, decreased strength, increased edema, impaired flexibility, improper body mechanics, postural dysfunction, and pain.   REHAB POTENTIAL: Good  CLINICAL DECISION MAKING: Stable/uncomplicated  EVALUATION COMPLEXITY: Low   GOALS: Goals reviewed with patient? Yes  SHORT TERM GOALS: Target date: 05/28/24 Independent with advanced HEP Goal status: progressing as she has one caregive that can do this 05/20/24  LONG TERM GOALS: Target date: 08/04/24  Independent with advanced HEP with caregiver Goal status: ongoing 06/24/24  2.  Transfer with set up and CGA Goal status: ongoing 06/19/24  3.  Walk HHA x 150 feet Goal status: Progressing 06/24/24  4.  Increase left LE strength to 4/5 Goal status: ongoing 06/12/24  5.  Report neck and shoulder pain decreased 50% Goal status: ongoing 06/12/24  PLAN:  PT FREQUENCY: 2x/week  PT DURATION: 12 weeks  PLANNED INTERVENTIONS: Therapeutic exercises, Therapeutic activity, Neuromuscular re-education, Balance  training, Gait training, Patient/Family education, Self Care, Joint mobilization, Moist heat, Taping, and Manual therapy  PLAN FOR NEXT SESSION: advance activities really work on stability of the left knee, hip adduction   Almetta Fam, PT 07/01/2024, 10:56 AM

## 2024-07-01 ENCOUNTER — Ambulatory Visit: Attending: Internal Medicine

## 2024-07-01 DIAGNOSIS — M542 Cervicalgia: Secondary | ICD-10-CM | POA: Diagnosis present

## 2024-07-01 DIAGNOSIS — M25512 Pain in left shoulder: Secondary | ICD-10-CM | POA: Insufficient documentation

## 2024-07-01 DIAGNOSIS — I6930 Unspecified sequelae of cerebral infarction: Secondary | ICD-10-CM | POA: Diagnosis present

## 2024-07-01 DIAGNOSIS — R262 Difficulty in walking, not elsewhere classified: Secondary | ICD-10-CM | POA: Diagnosis present

## 2024-07-01 DIAGNOSIS — I69354 Hemiplegia and hemiparesis following cerebral infarction affecting left non-dominant side: Secondary | ICD-10-CM | POA: Insufficient documentation

## 2024-07-01 DIAGNOSIS — R2681 Unsteadiness on feet: Secondary | ICD-10-CM | POA: Diagnosis present

## 2024-07-01 DIAGNOSIS — M25572 Pain in left ankle and joints of left foot: Secondary | ICD-10-CM | POA: Insufficient documentation

## 2024-07-01 DIAGNOSIS — M6281 Muscle weakness (generalized): Secondary | ICD-10-CM | POA: Insufficient documentation

## 2024-07-01 DIAGNOSIS — R270 Ataxia, unspecified: Secondary | ICD-10-CM | POA: Diagnosis present

## 2024-07-02 ENCOUNTER — Ambulatory Visit (HOSPITAL_COMMUNITY)
Admission: RE | Admit: 2024-07-02 | Discharge: 2024-07-02 | Disposition: A | Discharge status: Home / Self Care | Source: Ambulatory Visit | Attending: Internal Medicine | Admitting: Internal Medicine

## 2024-07-02 VITALS — BP 133/66 | HR 68 | Temp 97.2°F | Resp 17

## 2024-07-02 DIAGNOSIS — M810 Age-related osteoporosis without current pathological fracture: Secondary | ICD-10-CM | POA: Insufficient documentation

## 2024-07-02 DIAGNOSIS — Z79620 Long term (current) use of immunosuppressive biologic: Secondary | ICD-10-CM | POA: Insufficient documentation

## 2024-07-02 MED ORDER — DENOSUMAB 60 MG/ML ~~LOC~~ SOSY
60.0000 mg | PREFILLED_SYRINGE | Freq: Once | SUBCUTANEOUS | Status: AC
  Administered 2024-07-02: 60 mg via SUBCUTANEOUS

## 2024-07-02 MED ORDER — DENOSUMAB 60 MG/ML ~~LOC~~ SOSY
PREFILLED_SYRINGE | SUBCUTANEOUS | Status: AC
  Filled 2024-07-02: qty 1

## 2024-07-03 ENCOUNTER — Ambulatory Visit: Admitting: Physical Therapy

## 2024-07-03 ENCOUNTER — Encounter: Payer: Self-pay | Admitting: Physical Therapy

## 2024-07-03 DIAGNOSIS — M6281 Muscle weakness (generalized): Secondary | ICD-10-CM

## 2024-07-03 DIAGNOSIS — I69354 Hemiplegia and hemiparesis following cerebral infarction affecting left non-dominant side: Secondary | ICD-10-CM | POA: Diagnosis not present

## 2024-07-03 DIAGNOSIS — R262 Difficulty in walking, not elsewhere classified: Secondary | ICD-10-CM

## 2024-07-03 DIAGNOSIS — R2681 Unsteadiness on feet: Secondary | ICD-10-CM

## 2024-07-03 DIAGNOSIS — R270 Ataxia, unspecified: Secondary | ICD-10-CM | POA: Diagnosis not present

## 2024-07-03 DIAGNOSIS — M25512 Pain in left shoulder: Secondary | ICD-10-CM | POA: Diagnosis not present

## 2024-07-03 NOTE — Therapy (Signed)
 OUTPATIENT PHYSICAL THERAPY LOWER EXTREMITY TREATMENT    Patient Name: Karen Jackson MRN: [id number] DOB:[birth year], 83 y.o., female Today's Date: 07/03/2024  END OF SESSION:  PT End of Session - 07/03/24 1101     Visit Number 17    Date for Recertification  08/04/24    PT Start Time 1100    PT Stop Time 1145    PT Time Calculation (min) 45 min    Activity Tolerance Patient tolerated treatment well    Behavior During Therapy [facility] for tasks assessed/performed            Past Medical History:  Diagnosis Date   Acute cystitis without hematuria    Acute diastolic CHF (congestive heart failure) (HCC)    Arthritis    Dyspnea    Dysrhythmia    Fever of unknown origin 03/19/2017   Hyperlipidemia    Hypertension    denies at preop   Multifocal pneumonia    Neuromuscular disorder (HCC)    neuropathy left arm and foot   Osteopenia    Paralysis (HCC)    partial left side from CVA    Persistent atrial fibrillation (HCC)    PONV (postoperative nausea and vomiting)    Pre-diabetes    Stroke [facility]) 2013   hemmorahgic   Past Surgical History:  Procedure Laterality Date   ANKLE SURGERY     APPENDECTOMY     CHOLECYSTECTOMY     HARDWARE REMOVAL Left 03/29/2023   Procedure: HARDWARE REMOVAL;  Surgeon: Kit Rush, MD;  Location: [facility];  Service: Orthopedics;  Laterality: Left;   HERNIA REPAIR     Esophagus   INCISION AND DRAINAGE OF WOUND Left 03/29/2023   Procedure: LEFT ANKLE WOUND IRRIGATION AND DEBRIDEMENT WOUND;  Surgeon: Kit Rush, MD;  Location: [facility];  Service: Orthopedics;  Laterality: Left;   JOINT REPLACEMENT     total- right partial- left   MASTECTOMY PARTIAL / LUMPECTOMY  2012   left   ORIF ANKLE FRACTURE Left 07/20/2018   Procedure: OPEN REDUCTION INTERNAL FIXATION (ORIF) ANKLE FRACTURE;  Surgeon: Kit Rush, MD;  Location: [facility];  Service: Orthopedics;  Laterality: Left;   TOTAL KNEE ARTHROPLASTY Left 01/27/2019    Procedure: TOTAL KNEE ARTHROPLASTY;  Surgeon: Rubie Kemps, MD;  Location: [facility];  Service: Orthopedics;  Laterality: Left;   Patient Active Problem List   Diagnosis Date Noted   Senile osteoporosis 06/02/2024   Goals of care, counseling/discussion 01/17/2022   Grief 01/17/2022   Secondary hypercoagulable state 03/16/2021   Dizziness 03/10/2021   Orthostatic hypotension 10/15/2020   Presbycusis of both ears 03/08/2020   Mixed stress and urge urinary incontinence 12/02/2019   Macrocytosis 12/01/2019   Nutritional anemia 12/01/2019   S/P total knee replacement 01/27/2019   Recurrent left knee instability 07/05/2018   Respiratory failure with hypoxia (HCC) 08/30/2017   Hypoxemia    Heart failure with preserved ejection fraction (HCC), Grade 3 diastolic dysfunction 03/26/2017   PAF (paroxysmal atrial fibrillation) (HCC)    Dyspnea 03/19/2017   Encounter for preventive health examination 02/17/2016   Sensorineural hearing loss (SNHL), bilateral 01/26/2016   Hypomagnesemia 04/24/2014   Hemiparesis affecting left side as late effect of cerebrovascular accident (HCC) 04/24/2014   Nontraumatic cerebral hemorrhage (HCC) 04/30/2012   DM (diabetes mellitus) with complications (HCC) 03/04/2010   OSTEOPENIA 01/21/2009   UNSPECIFIED VITAMIN D  DEFICIENCY 11/19/2007   HYPERCHOLESTEROLEMIA 10/25/2006   GASTROESOPHAGEAL REFLUX, NO ESOPHAGITIS 10/25/2006   DIVERTICULOSIS OF COLON 10/25/2006  Osteoarthritis 10/25/2006   CERVICAL SPINE DISORDER, NOS 10/25/2006    PCP: Shayne, MD  REFERRING PROVIDER: Shayne, MD  REFERRING DIAG: debility  THERAPY DIAG:  Muscle weakness (generalized)  Hemiplegia and hemiparesis following cerebral infarction affecting left non-dominant side (HCC)  Unsteadiness on feet  Difficulty in walking, not elsewhere classified  Rationale for Evaluation and Treatment: Rehabilitation  ONSET DATE: 04/29/24  SUBJECTIVE:   SUBJECTIVE STATEMENT: I feel good kn sore,  shoulders are terrible, I'm just use to it  Patient has been a patient of mine off and on for many years, she has had many different diagnoses, she had a CVA about 13 years ago, she had a left ankle fracture with ORIF, she had a lot of issues with pain and wounds, so the hardware was removed last August, she has done pretty well but tends to back slide without PT with her ability to walk and transfer.  She has a caregiver about 11 hours a day.  She has been able to walk with me in PT, no one else has walked with her   PERTINENT HISTORY: CVA, CHF, HTN, A-fib, TKA PAIN:  Are you having pain? Yes: NPRS scale: 5-6/10 Pain location: shoulder Pain description: ache sore Aggravating factors: neck and shoulder always hurt, knee and ankle hurt with transfers Relieving factors: massage, heat  PRECAUTIONS: None  RED FLAGS: None   WEIGHT BEARING RESTRICTIONS: No  FALLS:  Has patient fallen in last 6 months? No  LIVING ENVIRONMENT: Lives with: lives alone Lives in: House/apartment Stairs: No Has following equipment at home: Environmental Consultant - 2 wheeled, Wheelchair (manual), shower chair, bed side commode, Grab bars, and Ramped entry care giver 11 hours a day  OCCUPATION: retired  PLOF: Needs assistance with homemaking, Needs assistance with transfers, and Leisure: plays bridge, she has 11 hours of an aide at home that helps with meals, going to MD's, dressing and bathing, is alone at night  PATIENT GOALS: walk, have less pain, transfer without difficulty  NEXT MD VISIT: none scheduled  OBJECTIVE:   DIAGNOSTIC FINDINGS: none performed  COGNITION: Overall cognitive status: Within functional limits for tasks assessed     SENSATION: WFL POSTURE: rounded shoulders and forward head  PALPATION: Left knee and ankle are swollen and tender, left upper and neck  LOWER EXTREMITY ROM:  Active ROM Left eval Left 05/15/24  Hip flexion    Hip extension    Hip abduction    Hip adduction    Hip  internal rotation    Hip external rotation    Knee flexion 90 91  Knee extension 0 0  Ankle dorsiflexion 5   Ankle plantarflexion 30   Ankle inversion 5   Ankle eversion 0    (Blank rows = not tested)  LOWER EXTREMITY MMT:  MMT Right eval Left eval  Hip flexion 3+ 3+  Hip extension    Hip abduction 4- 3+  Hip adduction 4- 3+  Hip internal rotation    Hip external rotation    Knee flexion 4- 3+  Knee extension 4- 3+  Ankle dorsiflexion  3+  Ankle plantarflexion    Ankle inversion    Ankle eversion     (Blank rows = not tested)  FUNCTIONAL TESTS:  Transfers Max A, left knee gives out and goes into ER and tends to bow into varus, has not walked since I discharged her 12/27/23.    GAIT: Distance walked: with HHA and w/c follow x 40 feet with pain in the left  knee a 7/10, she tends to rotate the left hip posterior and really bring the left knee into extension   TODAY'S TREATMENT:                                                                                                                              DATE:  07/03/24 STM to UT and cervical spine L digit approximation NuStep L 5 x 7 min 500 steps 5# LAQ 2x10 HS curls red 2x10 LLE ankle PF/DF green 2x10 Gait with HHA and W/C follow 169ft w/ one seated rest  06/30/24 NuStep L5x  3# marches  3# LAQ 2x15 Red band HS curls 2x10 STM to upper trap and neck Gait with HHA and W/C follow 2x103ft  06/24/24 Nustep level 5 x 8 minutes Seated partial sit ups Seated elbow touches each side 2# marches unsupported back 2# LAQ unsupported back 2 toe touches with HHA STM to the left upper trap and neck Unsupported sitting ball bounce Gait with HHA and w/c follow 2x90 feet  06/19/24 Nustep level 5 x 9 minutes Gait HHA with w/c follow 110 feet 5# LAQ 5# marches Red tband left hip adduction Red tband left hip abduction STM to the left upper trap and neck Gait again made it 80 feet and had to stop due to  fatigue  06/17/24 Nustep level 4 x 8 minutes Standing march  Side steps at mat  HS curls red 2x10 LAQ 3lb 2x10 STM to UT and cervical spine  Some with Tgun Gait 40ft HHA and W/c follow x 90 feet  06/12/24 Nustep level 5 x 8 minutes Supine bridges Supine rotations with knees tied together with red tband and feet on ball then bridges with legs on ball Isometric abs with ball Use of sliding board hip abduction and hip/knee flexion Red tband left hip adduction in hooklying Blue tband left hip extension in supine Gait with HHA and W/c follow x 90 feet  06/10/24 Nustep level 5 x 8 minutesm cues to keep left knee in STM to the left upper trap, neck and rhomboid 4# LAQ 4# marches Red tband HS curls Red tbnd ankle PF/DF Yellow tband with her really focused on the left only hip adduction and abduction Gait with HHA and W/C follow 2 x75 feet  06/05/24 Nustep level 5 x 8 minutes a lot of cues to keep left knee in STM to the left upper trap and the rhomboid and into the neck Gait with HHA and w/c follow 90 feet some medial left knee pain 3# MArches 3# LAQ Red tband left hip adduction Red tband HS curls Weight 145# with 2 person assist to get on scale Gait with HHA and w/c follow x100 feet  06/03/24 Nustep level 5 x 8 minutes Gait HHA with W/C follow 80 feet 3# marches 3# LAQ Red tband HS curls Red tband left hip adduction Red tband left hip abduction Red tband left ankle PF/DF Standing  weight shifts Gait HHA with w/c follow 80 feet  05/29/24 Nustep level 5 x 8 minutes STM to the left upper trap, neck and rhomboids Gait with HHA and w/c follow x 80 feet 3# marches 3# LAQ Red tband HS curls Red ankle PF/DF Yellow left hip abd/adduction some pain with adduction Gait as noted above again 60 feet, this time limited by fatigue and SOB   05/27/24 Nustep level 5 x 8 minutes Standing weight shifts STM to the left upper trap, rhomboid and neck 3# LAQ 3# marches Blue  tband HS curls some medial left knee pain Yellow tband left hip adduction Green tband clamshells with cues for the left hip Gait with HHA and w/c follow x80 feet, needs cues to keep the left foot in   05/22/24 Nustep level 5 x 8 minutes Standing weight shifts 2.5# marches 2.5# LAQ Red tband ankle PF/DF Yellow tband left hip adduction Red tband HS curls STM to the upper traps, neck and rhomboids Gait with HHA and W/C follow x 90 feet  05/20/24 Nustep level 5 x 8 minutes Red tband hip adduction and abduction, cues needed for form and to decreased the compensation of the left shoulder as she tends to elevate the left shoulder with left leg motions 2.5# marches 2.5# LAQ Yellow tband left ankle PF/DF and did some eversion STM to the left neck and upper trap and some into the left rhomboid Gait with HHA and W/C follow  30 feet and stopped due to left knee pain, then PT with some passive ROM and approximation and we walked another 70 feet, cues to keep foot narrow  PATIENT EDUCATION:  Education details: POC Person educated: Patient Education method: Explanation Education comprehension: verbalized understanding  HOME EXERCISE PROGRAM: 06/10/24  Marches, LAQ, ankle PF/DF, left hip adduction and abduction  ASSESSMENT:  CLINICAL IMPRESSION: Patient returns doing well, repots less congestions than last session. Interventions completed without back support to promote more core stability. Cue needed to hold contraction with LAQ. She did better with ambulation picking up her LLE with gait.       Patient is a 83 y.o. female who was seen today for physical therapy evaluation and treatment for debility,    she has not been able to walk or transfer or exercise much since D/C from PT in May.  I have seen her off and on over the past 20+ years and she is having a lot of difficulty controlling the left knee, it buckles and goes into varus and does some hyperextension, with walking 40 feet today she  reported knee pain a 7/10,    OBJECTIVE IMPAIRMENTS: Abnormal gait, cardiopulmonary status limiting activity, decreased activity tolerance, decreased balance, decreased coordination, decreased endurance, decreased mobility, difficulty walking, decreased ROM, decreased strength, increased edema, impaired flexibility, improper body mechanics, postural dysfunction, and pain.   REHAB POTENTIAL: Good  CLINICAL DECISION MAKING: Stable/uncomplicated  EVALUATION COMPLEXITY: Low   GOALS: Goals reviewed with patient? Yes  SHORT TERM GOALS: Target date: 05/28/24 Independent with advanced HEP Goal status: progressing as she has one caregive that can do this 05/20/24  LONG TERM GOALS: Target date: 08/04/24  Independent with advanced HEP with caregiver Goal status: ongoing 06/24/24  2.  Transfer with set up and CGA Goal status: ongoing 06/19/24  3.  Walk HHA x 150 feet Goal status: Progressing 06/24/24  4.  Increase left LE strength to 4/5 Goal status: ongoing 06/12/24  5.  Report neck and shoulder pain decreased 50% Goal status:  ongoing 06/12/24  PLAN:  PT FREQUENCY: 2x/week  PT DURATION: 12 weeks  PLANNED INTERVENTIONS: Therapeutic exercises, Therapeutic activity, Neuromuscular re-education, Balance training, Gait training, Patient/Family education, Self Care, Joint mobilization, Moist heat, Taping, and Manual therapy  PLAN FOR NEXT SESSION: advance activities really work on stability of the left knee, hip adduction   Tanda KANDICE Sorrow, PTA 07/03/2024, 11:02 AM

## 2024-07-07 ENCOUNTER — Encounter (HOSPITAL_BASED_OUTPATIENT_CLINIC_OR_DEPARTMENT_OTHER): Payer: Self-pay

## 2024-07-07 ENCOUNTER — Ambulatory Visit (INDEPENDENT_AMBULATORY_CARE_PROVIDER_SITE_OTHER)

## 2024-07-07 VITALS — BP 119/65 | HR 58 | Ht 59.0 in | Wt 140.0 lb

## 2024-07-07 DIAGNOSIS — I272 Pulmonary hypertension, unspecified: Secondary | ICD-10-CM

## 2024-07-07 DIAGNOSIS — J849 Interstitial pulmonary disease, unspecified: Secondary | ICD-10-CM | POA: Diagnosis not present

## 2024-07-07 DIAGNOSIS — R0602 Shortness of breath: Secondary | ICD-10-CM | POA: Diagnosis not present

## 2024-07-07 DIAGNOSIS — I4891 Unspecified atrial fibrillation: Secondary | ICD-10-CM

## 2024-07-07 DIAGNOSIS — Z87891 Personal history of nicotine dependence: Secondary | ICD-10-CM | POA: Diagnosis not present

## 2024-07-07 DIAGNOSIS — R053 Chronic cough: Secondary | ICD-10-CM

## 2024-07-07 MED ORDER — ALBUTEROL SULFATE HFA 108 (90 BASE) MCG/ACT IN AERS: 2.0000 | INHALATION_SPRAY | Freq: Four times a day (QID) | RESPIRATORY_TRACT | 6 refills | Status: DC | PRN

## 2024-07-07 NOTE — Progress Notes (Signed)
 @Patient  ID: Karen Jackson, female    DOB: 1941/04/23, 83 y.o.   MRN: [id number]  Chief Complaint  Patient presents with   Follow-up    2 month follow up chronic cough     Referring provider: Shayne Anes, MD  HPI: Discussed the use of AI scribe software for clinical note transcription with the patient, who gave verbal consent to proceed.  History of Present Illness Karen Jackson is an 83 year old female with pulmonary hypertension and interstitial lung disease who presents with worsening shortness of breath.  She experiences progressive worsening of shortness of breath, now only able to walk 30 to 40 feet before needing to rest. This has gradually deteriorated over time.    She has a history of a cough producing clear or white sputum, which she describes as 'clear stuff'. She does not frequently examine the sputum but notes it is mostly clear or white.  She has tried using a Symbicort  inhaler in the past without significant relief and is currently using Incruse, which she feels has not helped much either. She has albuterol  at home but has not been using it regularly.  Her past medical history includes pulmonary hypertension and interstitial lung disease. She had a CT scan in June 2023. She also has a history of atrial fibrillation and is on medications including Eliquis , atorvastatin , and metoprolol . She takes Lasix  daily except on Fridays to avoid interruptions during bridge games.  She has had previous pulmonary function tests. She has not had a recent lung function test with full volumes and gas exchange measurements.  She denies fever, chills, night sweats, weight loss, chest pain, palpitations, or other c/o.  Last OV 05/12/2024:  Karen Jackson is an 83 y/o female with PMH of PAF, HF, GERD, DM, and respiratory failure with hypoxia who presents for complaint of cough.  She was last seen in office in May 2023.  At that time it was recommended that she proceed with right sided heart cath  to rule out pulmonary hypertension as a contributor to her shortness of breath.  Patient reports that she did undergo an echocardiogram but ultimately decided against a cardiac catheterization as part of the workup.  She did not want to undergo another procedure.  Today she reports that she has had a productive cough for at least the last couple of years.  She states that it is productive of white to clear sputum.  She notices it mostly in the morning and improves throughout the day.  She takes Mucinex once in the morning for this.  She denies any seasonal allergies or postnasal drip.  She does report some shortness of breath but does not feel that this is worse than her baseline.  She is currently working with physical therapy to improve her stamina.  At her last visit she was prescribed a trial of Symbicort .  She states that she did not feel benefit from this and therefore stopped taking it.   TEST/EVENTS :   PFT completed 12/13/2021 with restrictive pattern suggested by FEV1, FVC, and FEV1/FVC ratios.  No lung volumes completed.  No response to bronchodilator.   Echocardiogram completed 05/09/2022: Right ventricular size was mildly enlarged with estimated RVSP PA at 44.2 mmHg left atrial size was moderately dilated as well no aortic stenosis   Allergies  Allergen Reactions   Codeine Phosphate Other (See Comments)    Immunization History  Administered Date(s) Administered   Fluad Quad(high Dose 65+) 06/17/2020, 05/05/2021  INFLUENZA, HIGH DOSE SEASONAL PF 04/29/2014   Influenza Split 06/20/2012   Influenza Whole 06/07/2007, 05/28/2008, 06/03/2010   Influenza,inj,Quad PF,6+ Mos 05/16/2013, 04/29/2014, 06/17/2015, 05/10/2017, 05/23/2018, 05/15/2019   PFIZER(Purple Top)SARS-COV-2 Vaccination 09/19/2019, 10/10/2019   Pneumococcal Conjugate-13 05/16/2013   Pneumococcal Polysaccharide-23 06/28/2002, 07/12/2007   Td 06/29/1999, 02/24/2009   Zoster, Live 09/28/2005    Past Medical History:   Diagnosis Date   Acute cystitis without hematuria    Acute diastolic CHF (congestive heart failure) (HCC)    Arthritis    Dyspnea    Dysrhythmia    Fever of unknown origin 03/19/2017   Hyperlipidemia    Hypertension    denies at preop   Multifocal pneumonia    Neuromuscular disorder (HCC)    neuropathy left arm and foot   Osteopenia    Paralysis (HCC)    partial left side from CVA    Persistent atrial fibrillation (HCC)    PONV (postoperative nausea and vomiting)    Pre-diabetes    Stroke [facility]) 2013   hemmorahgic    Tobacco History: Social History   Tobacco Use  Smoking Status Former   Current packs/day: 0.00   Average packs/day: 0.5 packs/day for 23.5 years (11.8 ttl pk-yrs)   Types: Cigarettes   Start date: 08/28/1957   Quit date: 03/09/1981   Years since quitting: 43.3  Smokeless Tobacco Never   Counseling given: Not Answered   Outpatient Medications Prior to Visit  Medication Sig Dispense Refill   apixaban  (ELIQUIS ) 5 MG TABS tablet Take 1 tablet (5 mg total) by mouth 2 (two) times daily. 60 tablet 11   atorvastatin  (LIPITOR) 40 MG tablet TAKE 1 TABLET(40 MG) BY MOUTH DAILY 90 tablet 3   Calcium  Carb-Cholecalciferol (CALCIUM  CARBONATE-VITAMIN D3) 600-400 MG-UNIT TABS Take 1 tablet by mouth daily.     celecoxib  (CELEBREX ) 200 MG capsule TAKE 1 CAPSULE BY MOUTH DAILY AS NEEDED FOR ARTHRITIS OR PAIN 90 capsule 3   diclofenac  Sodium (VOLTAREN ) 1 % GEL APPLY 2 GRAMS EXTERNALLY TO THE AFFECTED AREA FOUR TIMES DAILY     docusate sodium  (COLACE) 100 MG capsule Take 1 capsule (100 mg total) by mouth 2 (two) times daily. While taking narcotic pain medicine. 30 capsule 0   ergocalciferol  (VITAMIN D2) 1.25 MG (50000 UT) capsule 50,000 unit     furosemide  (LASIX ) 80 MG tablet Take 1 tablet (80 mg total) by mouth every other day. 90 tablet 3   gabapentin  (NEURONTIN ) 100 MG capsule TAKE 1 CAPSULE BY MOUTH 3  TIMES DAILY 270 capsule 3   hydrocortisone 2.5 % cream Apply topically  2 (two) times daily as needed.     lidocaine  (LIDODERM ) 5 % PLACE 1 PATCH ONTO THE SKIN DAILY. REMOVE AND DISCORD PATCH WITHIN 12 HOURS OR AS DIRECTED BY MD 30 patch 12   MAGnesium -Oxide 400 (240 Mg) MG tablet Take 1 tablet (400 mg total) by mouth 2 (two) times daily. 90 tablet 3   metoprolol  succinate (TOPROL -XL) 50 MG 24 hr tablet Take 1 tablet (50 mg total) by mouth daily. Take with or immediately following a meal. 90 tablet 3   omeprazole  (PRILOSEC) 20 MG capsule TAKE 1 CAPSULE BY MOUTH  DAILY 90 capsule 3   potassium chloride  SA (KLOR-CON  M20) 20 MEQ tablet Take 1 tablet (20 mEq total) by mouth daily. 180 tablet 3   Semaglutide , 1 MG/DOSE, (OZEMPIC , 1 MG/DOSE,) 4 MG/3ML SOPN Inject 1 mg into the skin once a week.     senna (SENOKOT) 8.6 MG TABS  tablet Take 2 tablets (17.2 mg total) by mouth 2 (two) times daily. 30 tablet 0   traZODone  (DESYREL ) 50 MG tablet TAKE 1 TABLET BY MOUTH AT  BEDTIME 90 tablet 3   umeclidinium bromide  (INCRUSE ELLIPTA ) 62.5 MCG/ACT AEPB Inhale 1 puff into the lungs daily. 30 each 5   No facility-administered medications prior to visit.     Review of Systems:   Constitutional:   No  weight loss, night sweats,  Fevers, chills, fatigue, or  lassitude.  HEENT:   No headaches,  Difficulty swallowing,  Tooth/dental problems, or  Sore throat,                No sneezing, itching, ear ache, nasal congestion, post nasal drip,   CV:  No chest pain,  Orthopnea, PND, swelling in lower extremities, anasarca, dizziness, palpitations, syncope.   GI  No heartburn, indigestion, abdominal pain, nausea, vomiting, diarrhea, change in bowel habits, loss of appetite, bloody stools.   Resp: No shortness of breath with exertion or at rest.  No excess mucus, no productive cough,  No non-productive cough,  No coughing up of blood.  No change in color of mucus.  No wheezing.  No chest wall deformity  Skin: no rash or lesions.  GU: no dysuria, change in color of urine, no urgency or  frequency.  No flank pain, no hematuria   MS:  No joint pain or swelling.  No decreased range of motion.  No back pain.    Physical Exam  BP 119/65   Pulse (!) 58   Ht 4' 11 (1.499 m)   Wt 140 lb (63.5 kg)   SpO2 94%   BMI 28.28 kg/m   GEN: A/Ox3; pleasant , NAD, well nourished    HEENT:  [place]/AT,  EACs-clear, TMs-wnl, NOSE-clear, THROAT-clear, no lesions, no postnasal drip or exudate noted.   NECK:  Supple w/ fair ROM; no JVD; normal carotid impulses w/o bruits; no thyromegaly or nodules palpated; no lymphadenopathy.    RESP  bibasilar fine crackles  P & A; w/o, wheezes or rhonchi. no accessory muscle use, no dullness to percussion  CARD: irregularly irregular, no m/r/g, no peripheral edema, pulses intact, no cyanosis or clubbing.  GI:   obese,soft & nt; nml bowel sounds; no organomegaly or masses detected.   Musco: Warm bil, no deformities or joint swelling noted.   Neuro: alert, no focal deficits noted.    Skin: Warm, no lesions or rashes    Lab Results:  CBC    Component Value Date/Time   WBC 7.3 01/25/2023 1550   WBC 6.5 03/10/2021 1244   RBC 4.34 01/25/2023 1550   RBC 4.48 03/10/2021 1244   HGB 14.9 01/25/2023 1550   HCT 42.9 01/25/2023 1550   PLT 295 01/25/2023 1550   MCV 99 (H) 01/25/2023 1550   MCH 34.3 (H) 01/25/2023 1550   MCH 32.1 03/10/2021 1244   MCHC 34.7 01/25/2023 1550   MCHC 33.7 03/10/2021 1244   RDW 12.2 01/25/2023 1550   LYMPHSABS 1.2 03/10/2021 1244   MONOABS 0.6 03/10/2021 1244   EOSABS 0.1 03/10/2021 1244   BASOSABS 0.0 03/10/2021 1244    BMET    Component Value Date/Time   NA 137 05/06/2024 1149   K 3.9 05/06/2024 1149   CL 92 (L) 05/06/2024 1149   CO2 26 05/06/2024 1149   GLUCOSE 101 (H) 05/06/2024 1149   GLUCOSE 136 (H) 03/10/2021 1244   BUN 13 05/06/2024 1149   CREATININE 0.63 05/06/2024  1149   CREATININE 0.64 03/14/2016 1020   CALCIUM  9.6 05/06/2024 1149   CALCIUM  10.7 (H) 03/04/2010 0000   GFRNONAA >60 03/10/2021  1244   GFRNONAA 88 03/14/2016 1020   GFRAA >60 01/28/2019 0435   GFRAA >89 03/14/2016 1020    BNP    Component Value Date/Time   BNP 118.8 (H) 07/26/2021 1648   BNP 144.7 (H) 03/10/2021 1244    ProBNP No results found for: PROBNP  Imaging: No results found.  Administration History     None          Latest Ref Rng & Units 12/13/2021    4:06 PM  PFT Results  FVC-Pre L 1.07   FVC-Predicted Pre % 57   FVC-Post L 1.05   FVC-Predicted Post % 56   Pre FEV1/FVC % % 80   Post FEV1/FCV % % 83   FEV1-Pre L 0.86   FEV1-Predicted Pre % 62   FEV1-Post L 0.87     No results found for: NITRICOXIDE   Assessment & Plan:   Assessment & Plan Chronic cough Assessment and Plan Assessment & Plan Chronic cough and shortness of breath Chronic cough with clear or white sputum and worsening shortness of breath. Previous treatments ineffective including Symbicort  and Incruse.  Minimal improvement in FEV1 post BD on PFT.  Shortness of breath likely multifactorial due to pulmonary hypertension, atrial fibrillation, and interstitial lung disease. Imaging showed mild interstitial lung disease and pulmonary hypertension. Lung function tests indicated restrictive lung disease. She is hesitant about further imaging. - Renewed albuterol  prescription, instructed to use 30 minutes before strenuous activities. - Ordered lung function tests: spirometry, lung volumes, DLCO. - Discussed potential CT scan of the chest for lung disease progression, considering her hesitancy.  She refuses presently.  Pulmonary hypertension Contributing to shortness of breath. Previous echocardiogram and imaging indicated pulmonary hypertension. No specific medications due to hypotension concerns. On metoprolol  for rate control and heart function. - Continue current medical management for pulmonary hypertension; followed by Cardiology  Atrial fibrillation Persistent atrial fibrillation contributing to shortness of  breath. Explained pathophysiology and impact on heart function and blood flow. On apixaban  for anticoagulation and metoprolol  for rate control. - Continue apixaban  for anticoagulation. - Continue metoprolol  for rate control.  Interstitial lung disease Noted on previous imaging and lung function tests. Contributing to restrictive lung disease and shortness of breath. No new imaging since June 2023. She is hesitant about further imaging. - Consider CT scan of the chest for progression of interstitial lung disease; patient refused presently as she does not feel it would change her care plan.   Return in about 2 months (around 09/06/2024) for PFT.  Candis Dandy, PA-C 07/07/2024

## 2024-07-07 NOTE — Patient Instructions (Signed)
 Complete pulmonary function tests as ordered.  Albuterol  MDI sent in to pharmacy of choice; use every 4-6 hours as needed.  Return to clinic in 2 months for follow up; may return sooner if new or worsening symptoms.

## 2024-07-08 ENCOUNTER — Ambulatory Visit: Admitting: Physical Therapy

## 2024-07-08 DIAGNOSIS — R2681 Unsteadiness on feet: Secondary | ICD-10-CM | POA: Diagnosis not present

## 2024-07-08 DIAGNOSIS — M6281 Muscle weakness (generalized): Secondary | ICD-10-CM | POA: Diagnosis not present

## 2024-07-08 DIAGNOSIS — E7849 Other hyperlipidemia: Secondary | ICD-10-CM | POA: Diagnosis not present

## 2024-07-08 DIAGNOSIS — I69354 Hemiplegia and hemiparesis following cerebral infarction affecting left non-dominant side: Secondary | ICD-10-CM | POA: Diagnosis not present

## 2024-07-08 DIAGNOSIS — M25512 Pain in left shoulder: Secondary | ICD-10-CM | POA: Diagnosis not present

## 2024-07-08 DIAGNOSIS — I4821 Permanent atrial fibrillation: Secondary | ICD-10-CM | POA: Diagnosis not present

## 2024-07-08 DIAGNOSIS — R262 Difficulty in walking, not elsewhere classified: Secondary | ICD-10-CM | POA: Diagnosis not present

## 2024-07-08 DIAGNOSIS — R7989 Other specified abnormal findings of blood chemistry: Secondary | ICD-10-CM | POA: Diagnosis not present

## 2024-07-08 DIAGNOSIS — I10 Essential (primary) hypertension: Secondary | ICD-10-CM | POA: Diagnosis not present

## 2024-07-08 DIAGNOSIS — R270 Ataxia, unspecified: Secondary | ICD-10-CM | POA: Diagnosis not present

## 2024-07-08 DIAGNOSIS — E1159 Type 2 diabetes mellitus with other circulatory complications: Secondary | ICD-10-CM | POA: Diagnosis not present

## 2024-07-08 DIAGNOSIS — M810 Age-related osteoporosis without current pathological fracture: Secondary | ICD-10-CM | POA: Diagnosis not present

## 2024-07-08 DIAGNOSIS — E785 Hyperlipidemia, unspecified: Secondary | ICD-10-CM | POA: Diagnosis not present

## 2024-07-08 DIAGNOSIS — D649 Anemia, unspecified: Secondary | ICD-10-CM | POA: Diagnosis not present

## 2024-07-08 NOTE — Therapy (Signed)
 OUTPATIENT PHYSICAL THERAPY LOWER EXTREMITY TREATMENT    Patient Name: Karen Jackson MRN: [id number] DOB:09-26-1940, 83 y.o., female Today's Date: 07/08/2024  END OF SESSION:  PT End of Session - 07/08/24 1151     Visit Number 18    Date for Recertification  08/04/24    Authorization Type Medicare    PT Start Time 1145    PT Stop Time 1225    PT Time Calculation (min) 40 min            Past Medical History:  Diagnosis Date   Acute cystitis without hematuria    Acute diastolic CHF (congestive heart failure) (HCC)    Arthritis    Dyspnea    Dysrhythmia    Fever of unknown origin 03/19/2017   Hyperlipidemia    Hypertension    denies at preop   Multifocal pneumonia    Neuromuscular disorder (HCC)    neuropathy left arm and foot   Osteopenia    Paralysis (HCC)    partial left side from CVA    Persistent atrial fibrillation (HCC)    PONV (postoperative nausea and vomiting)    Pre-diabetes    Stroke [facility]) 2013   hemmorahgic   Past Surgical History:  Procedure Laterality Date   ANKLE SURGERY     APPENDECTOMY     CHOLECYSTECTOMY     HARDWARE REMOVAL Left 03/29/2023   Procedure: HARDWARE REMOVAL;  Surgeon: Kit Rush, MD;  Location: [facility];  Service: Orthopedics;  Laterality: Left;   HERNIA REPAIR     Esophagus   INCISION AND DRAINAGE OF WOUND Left 03/29/2023   Procedure: LEFT ANKLE WOUND IRRIGATION AND DEBRIDEMENT WOUND;  Surgeon: Kit Rush, MD;  Location: [facility];  Service: Orthopedics;  Laterality: Left;   JOINT REPLACEMENT     total- right partial- left   MASTECTOMY PARTIAL / LUMPECTOMY  2012   left   ORIF ANKLE FRACTURE Left 07/20/2018   Procedure: OPEN REDUCTION INTERNAL FIXATION (ORIF) ANKLE FRACTURE;  Surgeon: Kit Rush, MD;  Location: [facility];  Service: Orthopedics;  Laterality: Left;   TOTAL KNEE ARTHROPLASTY Left 01/27/2019   Procedure: TOTAL KNEE ARTHROPLASTY;  Surgeon: Rubie Kemps, MD;  Location: [facility];   Service: Orthopedics;  Laterality: Left;   Patient Active Problem List   Diagnosis Date Noted   Senile osteoporosis 06/02/2024   Goals of care, counseling/discussion 01/17/2022   Grief 01/17/2022   Secondary hypercoagulable state 03/16/2021   Dizziness 03/10/2021   Orthostatic hypotension 10/15/2020   Presbycusis of both ears 03/08/2020   Mixed stress and urge urinary incontinence 12/02/2019   Macrocytosis 12/01/2019   Nutritional anemia 12/01/2019   S/P total knee replacement 01/27/2019   Recurrent left knee instability 07/05/2018   Respiratory failure with hypoxia (HCC) 08/30/2017   Hypoxemia    Heart failure with preserved ejection fraction (HCC), Grade 3 diastolic dysfunction 03/26/2017   PAF (paroxysmal atrial fibrillation) (HCC)    Dyspnea 03/19/2017   Encounter for preventive health examination 02/17/2016   Sensorineural hearing loss (SNHL), bilateral 01/26/2016   Hypomagnesemia 04/24/2014   Hemiparesis affecting left side as late effect of cerebrovascular accident (HCC) 04/24/2014   Nontraumatic cerebral hemorrhage (HCC) 04/30/2012   DM (diabetes mellitus) with complications (HCC) 03/04/2010   OSTEOPENIA 01/21/2009   UNSPECIFIED VITAMIN D  DEFICIENCY 11/19/2007   HYPERCHOLESTEROLEMIA 10/25/2006   GASTROESOPHAGEAL REFLUX, NO ESOPHAGITIS 10/25/2006   DIVERTICULOSIS OF COLON 10/25/2006   Osteoarthritis 10/25/2006   CERVICAL SPINE DISORDER, NOS 10/25/2006  PCP: Shayne, MD  REFERRING PROVIDER: Shayne, MD  REFERRING DIAG: debility  THERAPY DIAG:  Muscle weakness (generalized)  Hemiplegia and hemiparesis following cerebral infarction affecting left non-dominant side (HCC)  Unsteadiness on feet  Difficulty in walking, not elsewhere classified  Left shoulder pain, unspecified chronicity  Rationale for Evaluation and Treatment: Rehabilitation  ONSET DATE: 04/29/24  SUBJECTIVE:   SUBJECTIVE STATEMENT: Busy week so far with appointments  Patient has been a  patient of mine off and on for many years, she has had many different diagnoses, she had a CVA about 13 years ago, she had a left ankle fracture with ORIF, she had a lot of issues with pain and wounds, so the hardware was removed last August, she has done pretty well but tends to back slide without PT with her ability to walk and transfer.  She has a caregiver about 11 hours a day.  She has been able to walk with me in PT, no one else has walked with her   PERTINENT HISTORY: CVA, CHF, HTN, A-fib, TKA PAIN:  Are you having pain? Yes: NPRS scale: 5-6/10 Pain location: shoulder Pain description: ache sore Aggravating factors: neck and shoulder always hurt, knee and ankle hurt with transfers Relieving factors: massage, heat  PRECAUTIONS: None  RED FLAGS: None   WEIGHT BEARING RESTRICTIONS: No  FALLS:  Has patient fallen in last 6 months? No  LIVING ENVIRONMENT: Lives with: lives alone Lives in: House/apartment Stairs: No Has following equipment at home: Environmental Consultant - 2 wheeled, Wheelchair (manual), shower chair, bed side commode, Grab bars, and Ramped entry care giver 11 hours a day  OCCUPATION: retired  PLOF: Needs assistance with homemaking, Needs assistance with transfers, and Leisure: plays bridge, she has 11 hours of an aide at home that helps with meals, going to MD's, dressing and bathing, is alone at night  PATIENT GOALS: walk, have less pain, transfer without difficulty  NEXT MD VISIT: none scheduled  OBJECTIVE:   DIAGNOSTIC FINDINGS: none performed  COGNITION: Overall cognitive status: Within functional limits for tasks assessed     SENSATION: WFL POSTURE: rounded shoulders and forward head  PALPATION: Left knee and ankle are swollen and tender, left upper and neck  LOWER EXTREMITY ROM:  Active ROM Left eval Left 05/15/24  Hip flexion    Hip extension    Hip abduction    Hip adduction    Hip internal rotation    Hip external rotation    Knee flexion 90 91   Knee extension 0 0  Ankle dorsiflexion 5   Ankle plantarflexion 30   Ankle inversion 5   Ankle eversion 0    (Blank rows = not tested)  LOWER EXTREMITY MMT:  MMT Right eval Left eval  Hip flexion 3+ 3+  Hip extension    Hip abduction 4- 3+  Hip adduction 4- 3+  Hip internal rotation    Hip external rotation    Knee flexion 4- 3+  Knee extension 4- 3+  Ankle dorsiflexion  3+  Ankle plantarflexion    Ankle inversion    Ankle eversion     (Blank rows = not tested)  FUNCTIONAL TESTS:  Transfers Max A, left knee gives out and goes into ER and tends to bow into varus, has not walked since I discharged her 12/27/23.    GAIT: Distance walked: with HHA and w/c follow x 40 feet with pain in the left knee a 7/10, she tends to rotate the left hip posterior and really  bring the left knee into extension   TODAY'S TREATMENT:                                                                                                                              DATE:   07/08/24 NuStep L 5 x 8 min 500 steps STM to UT and cervical spine L digit approximation 5# LAQ 2x10 5# hip flexion seated 2 sets 10 HS curls red 2x10 STS with min A 5 x Gait with min HHA and W/C follow 170ft w/ one seated rest  07/03/24 STM to UT and cervical spine L digit approximation NuStep L 5 x 7 min 500 steps 5# LAQ 2x10 HS curls red 2x10 LLE ankle PF/DF green 2x10 Gait with HHA and W/C follow 185ft w/ one seated rest  06/30/24 NuStep L5x  3# marches  3# LAQ 2x15 Red band HS curls 2x10 STM to upper trap and neck Gait with HHA and W/C follow 2x88ft  06/24/24 Nustep level 5 x 8 minutes Seated partial sit ups Seated elbow touches each side 2# marches unsupported back 2# LAQ unsupported back 2 toe touches with HHA STM to the left upper trap and neck Unsupported sitting ball bounce Gait with HHA and w/c follow 2x90 feet  06/19/24 Nustep level 5 x 9 minutes Gait HHA with w/c follow 110 feet 5#  LAQ 5# marches Red tband left hip adduction Red tband left hip abduction STM to the left upper trap and neck Gait again made it 80 feet and had to stop due to fatigue  06/17/24 Nustep level 4 x 8 minutes Standing march  Side steps at mat  HS curls red 2x10 LAQ 3lb 2x10 STM to UT and cervical spine  Some with Tgun Gait 75ft HHA and W/c follow x 90 feet  06/12/24 Nustep level 5 x 8 minutes Supine bridges Supine rotations with knees tied together with red tband and feet on ball then bridges with legs on ball Isometric abs with ball Use of sliding board hip abduction and hip/knee flexion Red tband left hip adduction in hooklying Blue tband left hip extension in supine Gait with HHA and W/c follow x 90 feet  06/10/24 Nustep level 5 x 8 minutesm cues to keep left knee in STM to the left upper trap, neck and rhomboid 4# LAQ 4# marches Red tband HS curls Red tbnd ankle PF/DF Yellow tband with her really focused on the left only hip adduction and abduction Gait with HHA and W/C follow 2 x75 feet  06/05/24 Nustep level 5 x 8 minutes a lot of cues to keep left knee in STM to the left upper trap and the rhomboid and into the neck Gait with HHA and w/c follow 90 feet some medial left knee pain 3# MArches 3# LAQ Red tband left hip adduction Red tband HS curls Weight 145# with 2 person assist to get on scale Gait with HHA and w/c follow x100 feet  06/03/24 Nustep level 5 x 8 minutes Gait HHA with W/C follow 80 feet 3# marches 3# LAQ Red tband HS curls Red tband left hip adduction Red tband left hip abduction Red tband left ankle PF/DF Standing weight shifts Gait HHA with w/c follow 80 feet  05/29/24 Nustep level 5 x 8 minutes STM to the left upper trap, neck and rhomboids Gait with HHA and w/c follow x 80 feet 3# marches 3# LAQ Red tband HS curls Red ankle PF/DF Yellow left hip abd/adduction some pain with adduction Gait as noted above again 60 feet, this time  limited by fatigue and SOB   05/27/24 Nustep level 5 x 8 minutes Standing weight shifts STM to the left upper trap, rhomboid and neck 3# LAQ 3# marches Blue tband HS curls some medial left knee pain Yellow tband left hip adduction Green tband clamshells with cues for the left hip Gait with HHA and w/c follow x80 feet, needs cues to keep the left foot in   05/22/24 Nustep level 5 x 8 minutes Standing weight shifts 2.5# marches 2.5# LAQ Red tband ankle PF/DF Yellow tband left hip adduction Red tband HS curls STM to the upper traps, neck and rhomboids Gait with HHA and W/C follow x 90 feet  05/20/24 Nustep level 5 x 8 minutes Red tband hip adduction and abduction, cues needed for form and to decreased the compensation of the left shoulder as she tends to elevate the left shoulder with left leg motions 2.5# marches 2.5# LAQ Yellow tband left ankle PF/DF and did some eversion STM to the left neck and upper trap and some into the left rhomboid Gait with HHA and W/C follow  30 feet and stopped due to left knee pain, then PT with some passive ROM and approximation and we walked another 70 feet, cues to keep foot narrow  PATIENT EDUCATION:  Education details: POC Person educated: Patient Education method: Explanation Education comprehension: verbalized understanding  HOME EXERCISE PROGRAM: 06/10/24  Marches, LAQ, ankle PF/DF, left hip adduction and abduction  ASSESSMENT:  CLINICAL IMPRESSION: Loye reports busy past week with lots of MD appts and tests. States sh ehas got a lot of transferring in. Less SOB today with gait but did still required 1 seated rest, good cadence and step thru with gait today.     Patient is a 83 y.o. female who was seen today for physical therapy evaluation and treatment for debility,    she has not been able to walk or transfer or exercise much since D/C from PT in May.  I have seen her off and on over the past 20+ years and she is having a lot of  difficulty controlling the left knee, it buckles and goes into varus and does some hyperextension, with walking 40 feet today she reported knee pain a 7/10,    OBJECTIVE IMPAIRMENTS: Abnormal gait, cardiopulmonary status limiting activity, decreased activity tolerance, decreased balance, decreased coordination, decreased endurance, decreased mobility, difficulty walking, decreased ROM, decreased strength, increased edema, impaired flexibility, improper body mechanics, postural dysfunction, and pain.   REHAB POTENTIAL: Good  CLINICAL DECISION MAKING: Stable/uncomplicated  EVALUATION COMPLEXITY: Low   GOALS: Goals reviewed with patient? Yes  SHORT TERM GOALS: Target date: 05/28/24 Independent with advanced HEP Goal status: progressing as she has one caregive that can do this 05/20/24  LONG TERM GOALS: Target date: 08/04/24  Independent with advanced HEP with caregiver Goal status: ongoing 06/24/24  2.  Transfer with set up and CGA Goal status:  ongoing 06/19/24  3.  Walk HHA x 150 feet Goal status: Progressing 06/24/24  4.  Increase left LE strength to 4/5 Goal status: ongoing 06/12/24  5.  Report neck and shoulder pain decreased 50% Goal status: ongoing 06/12/24  PLAN:  PT FREQUENCY: 2x/week  PT DURATION: 12 weeks  PLANNED INTERVENTIONS: Therapeutic exercises, Therapeutic activity, Neuromuscular re-education, Balance training, Gait training, Patient/Family education, Self Care, Joint mobilization, Moist heat, Taping, and Manual therapy  PLAN FOR NEXT SESSION: advance activities really work on stability of the left knee, hip adduction. Assess goals   Darryl Blumenstein,ANGIE, PTA 07/08/2024, 12:23 PM

## 2024-07-10 ENCOUNTER — Ambulatory Visit: Admitting: Physical Therapy

## 2024-07-10 ENCOUNTER — Encounter: Payer: Self-pay | Admitting: Physical Therapy

## 2024-07-10 DIAGNOSIS — R262 Difficulty in walking, not elsewhere classified: Secondary | ICD-10-CM

## 2024-07-10 DIAGNOSIS — M25572 Pain in left ankle and joints of left foot: Secondary | ICD-10-CM

## 2024-07-10 DIAGNOSIS — I6930 Unspecified sequelae of cerebral infarction: Secondary | ICD-10-CM

## 2024-07-10 DIAGNOSIS — R270 Ataxia, unspecified: Secondary | ICD-10-CM | POA: Diagnosis not present

## 2024-07-10 DIAGNOSIS — I69354 Hemiplegia and hemiparesis following cerebral infarction affecting left non-dominant side: Secondary | ICD-10-CM

## 2024-07-10 DIAGNOSIS — M542 Cervicalgia: Secondary | ICD-10-CM

## 2024-07-10 DIAGNOSIS — M6281 Muscle weakness (generalized): Secondary | ICD-10-CM

## 2024-07-10 DIAGNOSIS — R2681 Unsteadiness on feet: Secondary | ICD-10-CM | POA: Diagnosis not present

## 2024-07-10 DIAGNOSIS — M25512 Pain in left shoulder: Secondary | ICD-10-CM | POA: Diagnosis not present

## 2024-07-10 NOTE — Therapy (Signed)
 OUTPATIENT PHYSICAL THERAPY LOWER EXTREMITY TREATMENT    Patient Name: Karen Jackson MRN: [id number] DOB:[birth year], 83 y.o., female Today's Date: 07/10/2024  END OF SESSION:  PT End of Session - 07/10/24 1102     Visit Number 19    Date for Recertification  08/04/24    Authorization Type Medicare    PT Start Time 1054    PT Stop Time 1140    PT Time Calculation (min) 46 min    Activity Tolerance Patient tolerated treatment well    Behavior During Therapy [facility] for tasks assessed/performed            Past Medical History:  Diagnosis Date   Acute cystitis without hematuria    Acute diastolic CHF (congestive heart failure) (HCC)    Arthritis    Dyspnea    Dysrhythmia    Fever of unknown origin 03/19/2017   Hyperlipidemia    Hypertension    denies at preop   Multifocal pneumonia    Neuromuscular disorder (HCC)    neuropathy left arm and foot   Osteopenia    Paralysis (HCC)    partial left side from CVA    Persistent atrial fibrillation (HCC)    PONV (postoperative nausea and vomiting)    Pre-diabetes    Stroke [facility]) 2013   hemmorahgic   Past Surgical History:  Procedure Laterality Date   ANKLE SURGERY     APPENDECTOMY     CHOLECYSTECTOMY     HARDWARE REMOVAL Left 03/29/2023   Procedure: HARDWARE REMOVAL;  Surgeon: Kit Rush, MD;  Location: [facility];  Service: Orthopedics;  Laterality: Left;   HERNIA REPAIR     Esophagus   INCISION AND DRAINAGE OF WOUND Left 03/29/2023   Procedure: LEFT ANKLE WOUND IRRIGATION AND DEBRIDEMENT WOUND;  Surgeon: Kit Rush, MD;  Location: [facility];  Service: Orthopedics;  Laterality: Left;   JOINT REPLACEMENT     total- right partial- left   MASTECTOMY PARTIAL / LUMPECTOMY  2012   left   ORIF ANKLE FRACTURE Left 07/20/2018   Procedure: OPEN REDUCTION INTERNAL FIXATION (ORIF) ANKLE FRACTURE;  Surgeon: Kit Rush, MD;  Location: [facility];  Service: Orthopedics;  Laterality: Left;   TOTAL KNEE  ARTHROPLASTY Left 01/27/2019   Procedure: TOTAL KNEE ARTHROPLASTY;  Surgeon: Rubie Kemps, MD;  Location: [facility];  Service: Orthopedics;  Laterality: Left;   Patient Active Problem List   Diagnosis Date Noted   Senile osteoporosis 06/02/2024   Goals of care, counseling/discussion 01/17/2022   Grief 01/17/2022   Secondary hypercoagulable state 03/16/2021   Dizziness 03/10/2021   Orthostatic hypotension 10/15/2020   Presbycusis of both ears 03/08/2020   Mixed stress and urge urinary incontinence 12/02/2019   Macrocytosis 12/01/2019   Nutritional anemia 12/01/2019   S/P total knee replacement 01/27/2019   Recurrent left knee instability 07/05/2018   Respiratory failure with hypoxia (HCC) 08/30/2017   Hypoxemia    Heart failure with preserved ejection fraction (HCC), Grade 3 diastolic dysfunction 03/26/2017   PAF (paroxysmal atrial fibrillation) (HCC)    Dyspnea 03/19/2017   Encounter for preventive health examination 02/17/2016   Sensorineural hearing loss (SNHL), bilateral 01/26/2016   Hypomagnesemia 04/24/2014   Hemiparesis affecting left side as late effect of cerebrovascular accident (HCC) 04/24/2014   Nontraumatic cerebral hemorrhage (HCC) 04/30/2012   DM (diabetes mellitus) with complications (HCC) 03/04/2010   OSTEOPENIA 01/21/2009   UNSPECIFIED VITAMIN D  DEFICIENCY 11/19/2007   HYPERCHOLESTEROLEMIA 10/25/2006   GASTROESOPHAGEAL REFLUX, NO ESOPHAGITIS 10/25/2006  DIVERTICULOSIS OF COLON 10/25/2006   Osteoarthritis 10/25/2006   CERVICAL SPINE DISORDER, NOS 10/25/2006    PCP: Shayne, MD  REFERRING PROVIDER: Shayne, MD  REFERRING DIAG: debility  THERAPY DIAG:  Muscle weakness (generalized)  Hemiplegia and hemiparesis following cerebral infarction affecting left non-dominant side (HCC)  Unsteadiness on feet  Difficulty in walking, not elsewhere classified  Left shoulder pain, unspecified chronicity  Ataxia  Cervicalgia  Pain in left ankle and joints of left  foot  Late effects of CVA (cerebrovascular accident)  Rationale for Evaluation and Treatment: Rehabilitation  ONSET DATE: 04/29/24  SUBJECTIVE:   SUBJECTIVE STATEMENT: I am doing okay, my breathing has not been doing well, I am getting albuterol  today  Patient has been a patient of mine off and on for many years, she has had many different diagnoses, she had a CVA about 13 years ago, she had a left ankle fracture with ORIF, she had a lot of issues with pain and wounds, so the hardware was removed last August, she has done pretty well but tends to back slide without PT with her ability to walk and transfer.  She has a caregiver about 11 hours a day.  She has been able to walk with me in PT, no one else has walked with her   PERTINENT HISTORY: CVA, CHF, HTN, A-fib, TKA PAIN:  Are you having pain? Yes: NPRS scale: 5-6/10 Pain location: shoulder Pain description: ache sore Aggravating factors: neck and shoulder always hurt, knee and ankle hurt with transfers Relieving factors: massage, heat  PRECAUTIONS: None  RED FLAGS: None   WEIGHT BEARING RESTRICTIONS: No  FALLS:  Has patient fallen in last 6 months? No  LIVING ENVIRONMENT: Lives with: lives alone Lives in: House/apartment Stairs: No Has following equipment at home: Environmental Consultant - 2 wheeled, Wheelchair (manual), shower chair, bed side commode, Grab bars, and Ramped entry care giver 11 hours a day  OCCUPATION: retired  PLOF: Needs assistance with homemaking, Needs assistance with transfers, and Leisure: plays bridge, she has 11 hours of an aide at home that helps with meals, going to MD's, dressing and bathing, is alone at night  PATIENT GOALS: walk, have less pain, transfer without difficulty  NEXT MD VISIT: none scheduled  OBJECTIVE:   DIAGNOSTIC FINDINGS: none performed  COGNITION: Overall cognitive status: Within functional limits for tasks assessed     SENSATION: WFL POSTURE: rounded shoulders and forward  head  PALPATION: Left knee and ankle are swollen and tender, left upper and neck  LOWER EXTREMITY ROM:  Active ROM Left eval Left 05/15/24  Hip flexion    Hip extension    Hip abduction    Hip adduction    Hip internal rotation    Hip external rotation    Knee flexion 90 91  Knee extension 0 0  Ankle dorsiflexion 5   Ankle plantarflexion 30   Ankle inversion 5   Ankle eversion 0    (Blank rows = not tested)  LOWER EXTREMITY MMT:  MMT Right eval Left eval  Hip flexion 3+ 3+  Hip extension    Hip abduction 4- 3+  Hip adduction 4- 3+  Hip internal rotation    Hip external rotation    Knee flexion 4- 3+  Knee extension 4- 3+  Ankle dorsiflexion  3+  Ankle plantarflexion    Ankle inversion    Ankle eversion     (Blank rows = not tested)  FUNCTIONAL TESTS:  Transfers Max A, left knee gives out and  goes into ER and tends to bow into varus, has not walked since I discharged her 12/27/23.    GAIT: Distance walked: with HHA and w/c follow x 40 feet with pain in the left knee a 7/10, she tends to rotate the left hip posterior and really bring the left knee into extension   TODAY'S TREATMENT:                                                                                                                              DATE:  07/10/24 Nustep level 5 x 9 minutes Standing weight shifts Standing left leg marches 3# marches and LAQ seated Red tband HS curls Red tband left hip only adduction and abduction Red tband left ankle motions STM to the left upper trap and neck Educated caregiver (new today) on transfers and transfer set up and safety Gait with HHA and w/c follow x 100 feet  07/08/24 NuStep L 5 x 8 min 500 steps STM to UT and cervical spine L digit approximation 5# LAQ 2x10 5# hip flexion seated 2 sets 10 HS curls red 2x10 STS with min A 5 x Gait with min HHA and W/C follow 123ft w/ one seated rest  07/03/24 STM to UT and cervical spine L digit  approximation NuStep L 5 x 7 min 500 steps 5# LAQ 2x10 HS curls red 2x10 LLE ankle PF/DF green 2x10 Gait with HHA and W/C follow 125ft w/ one seated rest  06/30/24 NuStep L5x  3# marches  3# LAQ 2x15 Red band HS curls 2x10 STM to upper trap and neck Gait with HHA and W/C follow 2x6ft  06/24/24 Nustep level 5 x 8 minutes Seated partial sit ups Seated elbow touches each side 2# marches unsupported back 2# LAQ unsupported back 2 toe touches with HHA STM to the left upper trap and neck Unsupported sitting ball bounce Gait with HHA and w/c follow 2x90 feet  06/19/24 Nustep level 5 x 9 minutes Gait HHA with w/c follow 110 feet 5# LAQ 5# marches Red tband left hip adduction Red tband left hip abduction STM to the left upper trap and neck Gait again made it 80 feet and had to stop due to fatigue  06/17/24 Nustep level 4 x 8 minutes Standing march  Side steps at mat  HS curls red 2x10 LAQ 3lb 2x10 STM to UT and cervical spine  Some with Tgun Gait 48ft HHA and W/c follow x 90 feet  06/12/24 Nustep level 5 x 8 minutes Supine bridges Supine rotations with knees tied together with red tband and feet on ball then bridges with legs on ball Isometric abs with ball Use of sliding board hip abduction and hip/knee flexion Red tband left hip adduction in hooklying Blue tband left hip extension in supine Gait with HHA and W/c follow x 90 feet  06/10/24 Nustep level 5 x 8 minutesm cues to keep left knee in STM to the left  upper trap, neck and rhomboid 4# LAQ 4# marches Red tband HS curls Red tbnd ankle PF/DF Yellow tband with her really focused on the left only hip adduction and abduction Gait with HHA and W/C follow 2 x75 feet  06/05/24 Nustep level 5 x 8 minutes a lot of cues to keep left knee in STM to the left upper trap and the rhomboid and into the neck Gait with HHA and w/c follow 90 feet some medial left knee pain 3# MArches 3# LAQ Red tband left hip  adduction Red tband HS curls Weight 145# with 2 person assist to get on scale Gait with HHA and w/c follow x100 feet  06/03/24 Nustep level 5 x 8 minutes Gait HHA with W/C follow 80 feet 3# marches 3# LAQ Red tband HS curls Red tband left hip adduction Red tband left hip abduction Red tband left ankle PF/DF Standing weight shifts Gait HHA with w/c follow 80 feet  PATIENT EDUCATION:  Education details: POC Person educated: Patient Education method: Explanation Education comprehension: verbalized understanding  HOME EXERCISE PROGRAM: 06/10/24  Marches, LAQ, ankle PF/DF, left hip adduction and abduction  ASSESSMENT:  CLINICAL IMPRESSION: Patient having some breathing issues will get albuterol  today.  She did not report left knee or ankle pain today.  We did do the weight shifts prior to ambulation and am not sure if this has affect on this.  Able to walk 100 feet today with fatigue but still good considering her breathing     Patient is a 83 y.o. female who was seen today for physical therapy evaluation and treatment for debility,    she has not been able to walk or transfer or exercise much since D/C from PT in May.  I have seen her off and on over the past 20+ years and she is having a lot of difficulty controlling the left knee, it buckles and goes into varus and does some hyperextension, with walking 40 feet today she reported knee pain a 7/10,    OBJECTIVE IMPAIRMENTS: Abnormal gait, cardiopulmonary status limiting activity, decreased activity tolerance, decreased balance, decreased coordination, decreased endurance, decreased mobility, difficulty walking, decreased ROM, decreased strength, increased edema, impaired flexibility, improper body mechanics, postural dysfunction, and pain.   REHAB POTENTIAL: Good  CLINICAL DECISION MAKING: Stable/uncomplicated  EVALUATION COMPLEXITY: Low   GOALS: Goals reviewed with patient? Yes  SHORT TERM GOALS: Target date:  05/28/24 Independent with advanced HEP Goal status: progressing as she has one caregive that can do this 05/20/24  LONG TERM GOALS: Target date: 08/04/24  Independent with advanced HEP with caregiver Goal status: ongoing 06/24/24  2.  Transfer with set up and CGA Goal status: will probably not meet this goal she needs Min A 07/10/24  3.  Walk HHA x 150 feet Goal status: Progressing 07/10/24  4.  Increase left LE strength to 4/5 Goal status: progressing 07/10/24  5.  Report neck and shoulder pain decreased 50% Goal status: progressing 07/10/24  PLAN:  PT FREQUENCY: 2x/week  PT DURATION: 12 weeks  PLANNED INTERVENTIONS: Therapeutic exercises, Therapeutic activity, Neuromuscular re-education, Balance training, Gait training, Patient/Family education, Self Care, Joint mobilization, Moist heat, Taping, and Manual therapy  PLAN FOR NEXT SESSION: advance activities really work on stability of the left knee, hip adduction.    OBADIAH OZELL ORN, PT 07/10/2024, 11:03 AM

## 2024-07-14 ENCOUNTER — Ambulatory Visit: Admitting: Physical Therapy

## 2024-07-15 ENCOUNTER — Ambulatory Visit: Admitting: Physical Therapy

## 2024-07-15 ENCOUNTER — Encounter: Payer: Self-pay | Admitting: Physical Therapy

## 2024-07-15 DIAGNOSIS — R262 Difficulty in walking, not elsewhere classified: Secondary | ICD-10-CM

## 2024-07-15 DIAGNOSIS — R2681 Unsteadiness on feet: Secondary | ICD-10-CM

## 2024-07-15 DIAGNOSIS — R82998 Other abnormal findings in urine: Secondary | ICD-10-CM | POA: Diagnosis not present

## 2024-07-15 DIAGNOSIS — M6281 Muscle weakness (generalized): Secondary | ICD-10-CM

## 2024-07-15 DIAGNOSIS — I69354 Hemiplegia and hemiparesis following cerebral infarction affecting left non-dominant side: Secondary | ICD-10-CM | POA: Diagnosis not present

## 2024-07-15 DIAGNOSIS — R270 Ataxia, unspecified: Secondary | ICD-10-CM | POA: Diagnosis not present

## 2024-07-15 DIAGNOSIS — M25512 Pain in left shoulder: Secondary | ICD-10-CM | POA: Diagnosis not present

## 2024-07-15 NOTE — Therapy (Signed)
 OUTPATIENT PHYSICAL THERAPY LOWER EXTREMITY TREATMENT   Progress Note Reporting Period 06/10/24 to 07/15/24 for visits 11-20  See note below for Objective Data and Assessment of Progress/Goals.     Patient Name: Karen Jackson MRN: [id number] DOB:Jan 26, 1941, 83 y.o., female Today's Date: 07/15/2024  END OF SESSION:  PT End of Session - 07/15/24 1147     Visit Number 20    Date for Recertification  08/04/24    PT Start Time 1145    PT Stop Time 1230    PT Time Calculation (min) 45 min    Activity Tolerance Patient tolerated treatment well    Behavior During Therapy [facility] for tasks assessed/performed            Past Medical History:  Diagnosis Date   Acute cystitis without hematuria    Acute diastolic CHF (congestive heart failure) (HCC)    Arthritis    Dyspnea    Dysrhythmia    Fever of unknown origin 03/19/2017   Hyperlipidemia    Hypertension    denies at preop   Multifocal pneumonia    Neuromuscular disorder (HCC)    neuropathy left arm and foot   Osteopenia    Paralysis (HCC)    partial left side from CVA    Persistent atrial fibrillation (HCC)    PONV (postoperative nausea and vomiting)    Pre-diabetes    Stroke [facility]) 2013   hemmorahgic   Past Surgical History:  Procedure Laterality Date   ANKLE SURGERY     APPENDECTOMY     CHOLECYSTECTOMY     HARDWARE REMOVAL Left 03/29/2023   Procedure: HARDWARE REMOVAL;  Surgeon: Kit Rush, MD;  Location: [facility];  Service: Orthopedics;  Laterality: Left;   HERNIA REPAIR     Esophagus   INCISION AND DRAINAGE OF WOUND Left 03/29/2023   Procedure: LEFT ANKLE WOUND IRRIGATION AND DEBRIDEMENT WOUND;  Surgeon: Kit Rush, MD;  Location: [facility];  Service: Orthopedics;  Laterality: Left;   JOINT REPLACEMENT     total- right partial- left   MASTECTOMY PARTIAL / LUMPECTOMY  2012   left   ORIF ANKLE FRACTURE Left 07/20/2018   Procedure: OPEN REDUCTION INTERNAL FIXATION (ORIF) ANKLE  FRACTURE;  Surgeon: Kit Rush, MD;  Location: [facility] OR;  Service: Orthopedics;  Laterality: Left;   TOTAL KNEE ARTHROPLASTY Left 01/27/2019   Procedure: TOTAL KNEE ARTHROPLASTY;  Surgeon: Rubie Kemps, MD;  Location: [facility];  Service: Orthopedics;  Laterality: Left;   Patient Active Problem List   Diagnosis Date Noted   Senile osteoporosis 06/02/2024   Goals of care, counseling/discussion 01/17/2022   Grief 01/17/2022   Secondary hypercoagulable state 03/16/2021   Dizziness 03/10/2021   Orthostatic hypotension 10/15/2020   Presbycusis of both ears 03/08/2020   Mixed stress and urge urinary incontinence 12/02/2019   Macrocytosis 12/01/2019   Nutritional anemia 12/01/2019   S/P total knee replacement 01/27/2019   Recurrent left knee instability 07/05/2018   Respiratory failure with hypoxia (HCC) 08/30/2017   Hypoxemia    Heart failure with preserved ejection fraction (HCC), Grade 3 diastolic dysfunction 03/26/2017   PAF (paroxysmal atrial fibrillation) (HCC)    Dyspnea 03/19/2017   Encounter for preventive health examination 02/17/2016   Sensorineural hearing loss (SNHL), bilateral 01/26/2016   Hypomagnesemia 04/24/2014   Hemiparesis affecting left side as late effect of cerebrovascular accident (HCC) 04/24/2014   Nontraumatic cerebral hemorrhage (HCC) 04/30/2012   DM (diabetes mellitus) with complications (HCC) 03/04/2010   OSTEOPENIA 01/21/2009  UNSPECIFIED VITAMIN D  DEFICIENCY 11/19/2007   HYPERCHOLESTEROLEMIA 10/25/2006   GASTROESOPHAGEAL REFLUX, NO ESOPHAGITIS 10/25/2006   DIVERTICULOSIS OF COLON 10/25/2006   Osteoarthritis 10/25/2006   CERVICAL SPINE DISORDER, NOS 10/25/2006    PCP: Shayne, MD  REFERRING PROVIDER: Shayne, MD  REFERRING DIAG: debility  THERAPY DIAG:  Muscle weakness (generalized)  Hemiplegia and hemiparesis following cerebral infarction affecting left non-dominant side (HCC)  Unsteadiness on feet  Difficulty in walking, not elsewhere  classified  Rationale for Evaluation and Treatment: Rehabilitation  ONSET DATE: 04/29/24  SUBJECTIVE:   SUBJECTIVE STATEMENT: The usual A little soreness   Patient has been a patient of mine off and on for many years, she has had many different diagnoses, she had a CVA about 13 years ago, she had a left ankle fracture with ORIF, she had a lot of issues with pain and wounds, so the hardware was removed last August, she has done pretty well but tends to back slide without PT with her ability to walk and transfer.  She has a caregiver about 11 hours a day.  She has been able to walk with me in PT, no one else has walked with her   PERTINENT HISTORY: CVA, CHF, HTN, A-fib, TKA PAIN:  Are you having pain? Yes: NPRS scale: 5-6/10 Pain location: shoulder Pain description: ache sore Aggravating factors: neck and shoulder always hurt, knee and ankle hurt with transfers Relieving factors: massage, heat  PRECAUTIONS: None  RED FLAGS: None   WEIGHT BEARING RESTRICTIONS: No  FALLS:  Has patient fallen in last 6 months? No  LIVING ENVIRONMENT: Lives with: lives alone Lives in: House/apartment Stairs: No Has following equipment at home: Environmental Consultant - 2 wheeled, Wheelchair (manual), shower chair, bed side commode, Grab bars, and Ramped entry care giver 11 hours a day  OCCUPATION: retired  PLOF: Needs assistance with homemaking, Needs assistance with transfers, and Leisure: plays bridge, she has 11 hours of an aide at home that helps with meals, going to MD's, dressing and bathing, is alone at night  PATIENT GOALS: walk, have less pain, transfer without difficulty  NEXT MD VISIT: none scheduled  OBJECTIVE:   DIAGNOSTIC FINDINGS: none performed  COGNITION: Overall cognitive status: Within functional limits for tasks assessed     SENSATION: WFL POSTURE: rounded shoulders and forward head  PALPATION: Left knee and ankle are swollen and tender, left upper and neck  LOWER EXTREMITY  ROM:  Active ROM Left eval Left 05/15/24 Left 07/15/24  Hip flexion     Hip extension     Hip abduction     Hip adduction     Hip internal rotation     Hip external rotation     Knee flexion 90 91 93  Knee extension 0 0 0  Ankle dorsiflexion 5    Ankle plantarflexion 30    Ankle inversion 5    Ankle eversion 0     (Blank rows = not tested)  LOWER EXTREMITY MMT:  MMT Right eval Left eval Right 07/15/24 Left 07/15/24  Hip flexion 3+ 3+ 4- 4-  Hip extension      Hip abduction 4- 3+ 4 4  Hip adduction 4- 3+    Hip internal rotation      Hip external rotation      Knee flexion 4- 3+ 4 4-  Knee extension 4- 3+ 4 4  Ankle dorsiflexion  3+  4  Ankle plantarflexion      Ankle inversion      Ankle eversion       (  Blank rows = not tested)  FUNCTIONAL TESTS:  Transfers Max A, left knee gives out and goes into ER and tends to bow into varus, has not walked since I discharged her 12/27/23.    GAIT: Distance walked: with HHA and w/c follow x 40 feet with pain in the left knee a 7/10, she tends to rotate the left hip posterior and really bring the left knee into extension   TODAY'S TREATMENT:                                                                                                                              DATE:  07/15/24 STM to UT and cervical spine L digit approximation NuStep L 5 x 8 min Goals  LAQ 3lb 2x10 Seated March 3lb 2x10 Hs curls green 2x10 Gait with HHA and w/c follow x 100 feet one seated rest   07/10/24 Nustep level 5 x 9 minutes Standing weight shifts Standing left leg marches 3# marches and LAQ seated Red tband HS curls Red tband left hip only adduction and abduction Red tband left ankle motions STM to the left upper trap and neck Educated caregiver (new today) on transfers and transfer set up and safety Gait with HHA and w/c follow x 100 feet  07/08/24 NuStep L 5 x 8 min 500 steps STM to UT and cervical spine L digit approximation 5# LAQ  2x10 5# hip flexion seated 2 sets 10 HS curls red 2x10 STS with min A 5 x Gait with min HHA and W/C follow 135ft w/ one seated rest  07/03/24 STM to UT and cervical spine L digit approximation NuStep L 5 x 7 min 500 steps 5# LAQ 2x10 HS curls red 2x10 LLE ankle PF/DF green 2x10 Gait with HHA and W/C follow 171ft w/ one seated rest  06/30/24 NuStep L5x  3# marches  3# LAQ 2x15 Red band HS curls 2x10 STM to upper trap and neck Gait with HHA and W/C follow 2x76ft  06/24/24 Nustep level 5 x 8 minutes Seated partial sit ups Seated elbow touches each side 2# marches unsupported back 2# LAQ unsupported back 2 toe touches with HHA STM to the left upper trap and neck Unsupported sitting ball bounce Gait with HHA and w/c follow 2x90 feet  06/19/24 Nustep level 5 x 9 minutes Gait HHA with w/c follow 110 feet 5# LAQ 5# marches Red tband left hip adduction Red tband left hip abduction STM to the left upper trap and neck Gait again made it 80 feet and had to stop due to fatigue  06/17/24 Nustep level 4 x 8 minutes Standing march  Side steps at mat  HS curls red 2x10 LAQ 3lb 2x10 STM to UT and cervical spine  Some with Tgun Gait 60ft HHA and W/c follow x 90 feet  06/12/24 Nustep level 5 x 8 minutes Supine bridges Supine rotations with knees tied together with red tband and feet on ball  then bridges with legs on ball Isometric abs with ball Use of sliding board hip abduction and hip/knee flexion Red tband left hip adduction in hooklying Blue tband left hip extension in supine Gait with HHA and W/c follow x 90 feet  06/10/24 Nustep level 5 x 8 minutesm cues to keep left knee in STM to the left upper trap, neck and rhomboid 4# LAQ 4# marches Red tband HS curls Red tbnd ankle PF/DF Yellow tband with her really focused on the left only hip adduction and abduction Gait with HHA and W/C follow 2 x75 feet  06/05/24 Nustep level 5 x 8 minutes a lot of cues to  keep left knee in STM to the left upper trap and the rhomboid and into the neck Gait with HHA and w/c follow 90 feet some medial left knee pain 3# MArches 3# LAQ Red tband left hip adduction Red tband HS curls Weight 145# with 2 person assist to get on scale Gait with HHA and w/c follow x100 feet  06/03/24 Nustep level 5 x 8 minutes Gait HHA with W/C follow 80 feet 3# marches 3# LAQ Red tband HS curls Red tband left hip adduction Red tband left hip abduction Red tband left ankle PF/DF Standing weight shifts Gait HHA with w/c follow 80 feet  PATIENT EDUCATION:  Education details: POC Person educated: Patient Education method: Explanation Education comprehension: verbalized understanding  HOME EXERCISE PROGRAM: 06/10/24  Marches, LAQ, ankle PF/DF, left hip adduction and abduction  ASSESSMENT:  CLINICAL IMPRESSION: Patient present for her 20th PT visit. She has slightly increased her L knee AROM. She has also progressed increasing her LE strength. She did not report left knee or ankle pain today.  Cues for full ROM needed with leg curls and extensions. Able to walk 100 feet today with fatigue requiring one seated rest. Some improvement with L shoulder pain.     Patient is a 83 y.o. female who was seen today for physical therapy evaluation and treatment for debility,    she has not been able to walk or transfer or exercise much since D/C from PT in May.  I have seen her off and on over the past 20+ years and she is having a lot of difficulty controlling the left knee, it buckles and goes into varus and does some hyperextension, with walking 40 feet today she reported knee pain a 7/10,    OBJECTIVE IMPAIRMENTS: Abnormal gait, cardiopulmonary status limiting activity, decreased activity tolerance, decreased balance, decreased coordination, decreased endurance, decreased mobility, difficulty walking, decreased ROM, decreased strength, increased edema, impaired flexibility, improper  body mechanics, postural dysfunction, and pain.   REHAB POTENTIAL: Good  CLINICAL DECISION MAKING: Stable/uncomplicated  EVALUATION COMPLEXITY: Low   GOALS: Goals reviewed with patient? Yes  SHORT TERM GOALS: Target date: 05/28/24 Independent with advanced HEP Goal status: progressing as she has one caregive that can do this 05/20/24  LONG TERM GOALS: Target date: 08/04/24  Independent with advanced HEP with caregiver Goal status: ongoing 06/24/24  2.  Transfer with set up and CGA Goal status: will probably not meet this goal she needs Min A 07/15/24  3.  Walk HHA x 150 feet Goal status: Progressing 07/10/24  4.  Increase left LE strength to 4/5 Goal status: progressing 07/10/24  5.  Report neck and shoulder pain decreased 50% Goal status: progressing 07/10/24, Progressing 20% 07/15/24  PLAN:  PT FREQUENCY: 2x/week  PT DURATION: 12 weeks  PLANNED INTERVENTIONS: Therapeutic exercises, Therapeutic activity, Neuromuscular re-education, Balance  training, Gait training, Patient/Family education, Self Care, Joint mobilization, Moist heat, Taping, and Manual therapy  PLAN FOR NEXT SESSION: advance activities really work on stability of the left knee, hip adduction.    Tanda KANDICE Sorrow, PTA 07/15/2024, 11:48 AM

## 2024-07-16 ENCOUNTER — Other Ambulatory Visit: Payer: Self-pay | Admitting: Internal Medicine

## 2024-07-16 ENCOUNTER — Other Ambulatory Visit (HOSPITAL_COMMUNITY): Payer: Self-pay | Admitting: Internal Medicine

## 2024-07-16 ENCOUNTER — Ambulatory Visit: Admitting: Pulmonary Disease

## 2024-07-16 DIAGNOSIS — R059 Cough, unspecified: Secondary | ICD-10-CM

## 2024-07-17 ENCOUNTER — Encounter: Payer: Self-pay | Admitting: Physical Therapy

## 2024-07-17 ENCOUNTER — Ambulatory Visit: Admitting: Physical Therapy

## 2024-07-17 DIAGNOSIS — M25512 Pain in left shoulder: Secondary | ICD-10-CM

## 2024-07-17 DIAGNOSIS — R270 Ataxia, unspecified: Secondary | ICD-10-CM

## 2024-07-17 DIAGNOSIS — R2681 Unsteadiness on feet: Secondary | ICD-10-CM | POA: Diagnosis not present

## 2024-07-17 DIAGNOSIS — R262 Difficulty in walking, not elsewhere classified: Secondary | ICD-10-CM

## 2024-07-17 DIAGNOSIS — I6930 Unspecified sequelae of cerebral infarction: Secondary | ICD-10-CM

## 2024-07-17 DIAGNOSIS — M25572 Pain in left ankle and joints of left foot: Secondary | ICD-10-CM

## 2024-07-17 DIAGNOSIS — M6281 Muscle weakness (generalized): Secondary | ICD-10-CM

## 2024-07-17 DIAGNOSIS — I69354 Hemiplegia and hemiparesis following cerebral infarction affecting left non-dominant side: Secondary | ICD-10-CM | POA: Diagnosis not present

## 2024-07-17 DIAGNOSIS — M542 Cervicalgia: Secondary | ICD-10-CM

## 2024-07-17 NOTE — Therapy (Signed)
 OUTPATIENT PHYSICAL THERAPY LOWER EXTREMITY TREATMENT     Patient Name: Karen Jackson MRN: [id number] DOB:1940/09/20, 83 y.o., female Today's Date: 07/17/2024  END OF SESSION:  PT End of Session - 07/17/24 1059     Visit Number 21    Date for Recertification  08/04/24    Authorization Type Medicare    PT Start Time 1056    PT Stop Time 1140    PT Time Calculation (min) 44 min    Activity Tolerance Patient tolerated treatment well    Behavior During Therapy [facility] for tasks assessed/performed            Past Medical History:  Diagnosis Date   Acute cystitis without hematuria    Acute diastolic CHF (congestive heart failure) (HCC)    Arthritis    Dyspnea    Dysrhythmia    Fever of unknown origin 03/19/2017   Hyperlipidemia    Hypertension    denies at preop   Multifocal pneumonia    Neuromuscular disorder (HCC)    neuropathy left arm and foot   Osteopenia    Paralysis (HCC)    partial left side from CVA    Persistent atrial fibrillation (HCC)    PONV (postoperative nausea and vomiting)    Pre-diabetes    Stroke [facility]) 2013   hemmorahgic   Past Surgical History:  Procedure Laterality Date   ANKLE SURGERY     APPENDECTOMY     CHOLECYSTECTOMY     HARDWARE REMOVAL Left 03/29/2023   Procedure: HARDWARE REMOVAL;  Surgeon: Kit Rush, MD;  Location: [facility];  Service: Orthopedics;  Laterality: Left;   HERNIA REPAIR     Esophagus   INCISION AND DRAINAGE OF WOUND Left 03/29/2023   Procedure: LEFT ANKLE WOUND IRRIGATION AND DEBRIDEMENT WOUND;  Surgeon: Kit Rush, MD;  Location: [facility];  Service: Orthopedics;  Laterality: Left;   JOINT REPLACEMENT     total- right partial- left   MASTECTOMY PARTIAL / LUMPECTOMY  2012   left   ORIF ANKLE FRACTURE Left 07/20/2018   Procedure: OPEN REDUCTION INTERNAL FIXATION (ORIF) ANKLE FRACTURE;  Surgeon: Kit Rush, MD;  Location: [facility];  Service: Orthopedics;  Laterality: Left;   TOTAL KNEE  ARTHROPLASTY Left 01/27/2019   Procedure: TOTAL KNEE ARTHROPLASTY;  Surgeon: Rubie Kemps, MD;  Location: [facility];  Service: Orthopedics;  Laterality: Left;   Patient Active Problem List   Diagnosis Date Noted   Senile osteoporosis 06/02/2024   Goals of care, counseling/discussion 01/17/2022   Grief 01/17/2022   Secondary hypercoagulable state 03/16/2021   Dizziness 03/10/2021   Orthostatic hypotension 10/15/2020   Presbycusis of both ears 03/08/2020   Mixed stress and urge urinary incontinence 12/02/2019   Macrocytosis 12/01/2019   Nutritional anemia 12/01/2019   S/P total knee replacement 01/27/2019   Recurrent left knee instability 07/05/2018   Respiratory failure with hypoxia (HCC) 08/30/2017   Hypoxemia    Heart failure with preserved ejection fraction (HCC), Grade 3 diastolic dysfunction 03/26/2017   PAF (paroxysmal atrial fibrillation) (HCC)    Dyspnea 03/19/2017   Encounter for preventive health examination 02/17/2016   Sensorineural hearing loss (SNHL), bilateral 01/26/2016   Hypomagnesemia 04/24/2014   Hemiparesis affecting left side as late effect of cerebrovascular accident (HCC) 04/24/2014   Nontraumatic cerebral hemorrhage (HCC) 04/30/2012   DM (diabetes mellitus) with complications (HCC) 03/04/2010   OSTEOPENIA 01/21/2009   UNSPECIFIED VITAMIN D  DEFICIENCY 11/19/2007   HYPERCHOLESTEROLEMIA 10/25/2006   GASTROESOPHAGEAL REFLUX, NO ESOPHAGITIS 10/25/2006  DIVERTICULOSIS OF COLON 10/25/2006   Osteoarthritis 10/25/2006   CERVICAL SPINE DISORDER, NOS 10/25/2006    PCP: Shayne, MD  REFERRING PROVIDER: Shayne, MD  REFERRING DIAG: debility  THERAPY DIAG:  Muscle weakness (generalized)  Hemiplegia and hemiparesis following cerebral infarction affecting left non-dominant side (HCC)  Unsteadiness on feet  Difficulty in walking, not elsewhere classified  Left shoulder pain, unspecified chronicity  Ataxia  Cervicalgia  Pain in left ankle and joints of left  foot  Late effects of CVA (cerebrovascular accident)  Rationale for Evaluation and Treatment: Rehabilitation  ONSET DATE: 04/29/24  SUBJECTIVE:   SUBJECTIVE STATEMENT: Same old soreness  Patient has been a patient of mine off and on for many years, she has had many different diagnoses, she had a CVA about 13 years ago, she had a left ankle fracture with ORIF, she had a lot of issues with pain and wounds, so the hardware was removed last August, she has done pretty well but tends to back slide without PT with her ability to walk and transfer.  She has a caregiver about 11 hours a day.  She has been able to walk with me in PT, no one else has walked with her   PERTINENT HISTORY: CVA, CHF, HTN, A-fib, TKA PAIN:  Are you having pain? Yes: NPRS scale: 5-6/10 Pain location: shoulder Pain description: ache sore Aggravating factors: neck and shoulder always hurt, knee and ankle hurt with transfers Relieving factors: massage, heat  PRECAUTIONS: None  RED FLAGS: None   WEIGHT BEARING RESTRICTIONS: No  FALLS:  Has patient fallen in last 6 months? No  LIVING ENVIRONMENT: Lives with: lives alone Lives in: House/apartment Stairs: No Has following equipment at home: Environmental Consultant - 2 wheeled, Wheelchair (manual), shower chair, bed side commode, Grab bars, and Ramped entry care giver 11 hours a day  OCCUPATION: retired  PLOF: Needs assistance with homemaking, Needs assistance with transfers, and Leisure: plays bridge, she has 11 hours of an aide at home that helps with meals, going to MD's, dressing and bathing, is alone at night  PATIENT GOALS: walk, have less pain, transfer without difficulty  NEXT MD VISIT: none scheduled  OBJECTIVE:   DIAGNOSTIC FINDINGS: none performed  COGNITION: Overall cognitive status: Within functional limits for tasks assessed     SENSATION: WFL POSTURE: rounded shoulders and forward head  PALPATION: Left knee and ankle are swollen and tender, left  upper and neck  LOWER EXTREMITY ROM:  Active ROM Left eval Left 05/15/24 Left 07/15/24  Hip flexion     Hip extension     Hip abduction     Hip adduction     Hip internal rotation     Hip external rotation     Knee flexion 90 91 93  Knee extension 0 0 0  Ankle dorsiflexion 5    Ankle plantarflexion 30    Ankle inversion 5    Ankle eversion 0     (Blank rows = not tested)  LOWER EXTREMITY MMT:  MMT Right eval Left eval Right 07/15/24 Left 07/15/24  Hip flexion 3+ 3+ 4- 4-  Hip extension      Hip abduction 4- 3+ 4 4  Hip adduction 4- 3+    Hip internal rotation      Hip external rotation      Knee flexion 4- 3+ 4 4-  Knee extension 4- 3+ 4 4  Ankle dorsiflexion  3+  4  Ankle plantarflexion      Ankle inversion  Ankle eversion       (Blank rows = not tested)  FUNCTIONAL TESTS:  Transfers Max A, left knee gives out and goes into ER and tends to bow into varus, has not walked since I discharged her 12/27/23.    GAIT: Distance walked: with HHA and w/c follow x 40 feet with pain in the left knee a 7/10, she tends to rotate the left hip posterior and really bring the left knee into extension   TODAY'S TREATMENT:                                                                                                                              DATE:  07/17/24 Nustep level 5 x 8 minutes STM to the left upper trap and neck area Gait 80 feet with HHA and w/c follow Red tband hip adduction, abduction Red tband HS curl Red tband ankle PF/DF 5# LAQ and marches Gait HHA and w/c follow 70 feet,   07/15/24 STM to UT and cervical spine L digit approximation NuStep L 5 x 8 min Goals  LAQ 3lb 2x10 Seated March 3lb 2x10 Hs curls green 2x10 Gait with HHA and w/c follow x 100 feet one seated rest   07/10/24 Nustep level 5 x 9 minutes Standing weight shifts Standing left leg marches 3# marches and LAQ seated Red tband HS curls Red tband left hip only adduction and  abduction Red tband left ankle motions STM to the left upper trap and neck Educated caregiver (new today) on transfers and transfer set up and safety Gait with HHA and w/c follow x 100 feet  07/08/24 NuStep L 5 x 8 min 500 steps STM to UT and cervical spine L digit approximation 5# LAQ 2x10 5# hip flexion seated 2 sets 10 HS curls red 2x10 STS with min A 5 x Gait with min HHA and W/C follow 119ft w/ one seated rest  07/03/24 STM to UT and cervical spine L digit approximation NuStep L 5 x 7 min 500 steps 5# LAQ 2x10 HS curls red 2x10 LLE ankle PF/DF green 2x10 Gait with HHA and W/C follow 125ft w/ one seated rest  06/30/24 NuStep L5x  3# marches  3# LAQ 2x15 Red band HS curls 2x10 STM to upper trap and neck Gait with HHA and W/C follow 2x63ft  06/24/24 Nustep level 5 x 8 minutes Seated partial sit ups Seated elbow touches each side 2# marches unsupported back 2# LAQ unsupported back 2 toe touches with HHA STM to the left upper trap and neck Unsupported sitting ball bounce Gait with HHA and w/c follow 2x90 feet  06/19/24 Nustep level 5 x 9 minutes Gait HHA with w/c follow 110 feet 5# LAQ 5# marches Red tband left hip adduction Red tband left hip abduction STM to the left upper trap and neck Gait again made it 80 feet and had to stop due to fatigue  06/17/24 Nustep level 4 x 8  minutes Standing march  Side steps at mat  HS curls red 2x10 LAQ 3lb 2x10 STM to UT and cervical spine  Some with Tgun Gait 29ft HHA and W/c follow x 90 feet  06/12/24 Nustep level 5 x 8 minutes Supine bridges Supine rotations with knees tied together with red tband and feet on ball then bridges with legs on ball Isometric abs with ball Use of sliding board hip abduction and hip/knee flexion Red tband left hip adduction in hooklying Blue tband left hip extension in supine Gait with HHA and W/c follow x 90 feet  06/10/24 Nustep level 5 x 8 minutesm cues to keep left  knee in STM to the left upper trap, neck and rhomboid 4# LAQ 4# marches Red tband HS curls Red tbnd ankle PF/DF Yellow tband with her really focused on the left only hip adduction and abduction Gait with HHA and W/C follow 2 x75 feet  06/05/24 Nustep level 5 x 8 minutes a lot of cues to keep left knee in STM to the left upper trap and the rhomboid and into the neck Gait with HHA and w/c follow 90 feet some medial left knee pain 3# MArches 3# LAQ Red tband left hip adduction Red tband HS curls Weight 145# with 2 person assist to get on scale Gait with HHA and w/c follow x100 feet  06/03/24 Nustep level 5 x 8 minutes Gait HHA with W/C follow 80 feet 3# marches 3# LAQ Red tband HS curls Red tband left hip adduction Red tband left hip abduction Red tband left ankle PF/DF Standing weight shifts Gait HHA with w/c follow 80 feet  PATIENT EDUCATION:  Education details: POC Person educated: Patient Education method: Explanation Education comprehension: verbalized understanding  HOME EXERCISE PROGRAM: 06/10/24  Marches, LAQ, ankle PF/DF, left hip adduction and abduction  ASSESSMENT:  CLINICAL IMPRESSION: Patient today with with standing and walking had the left foot placed out very wide, we talked about this putting strain on the knee and she needed verbal cues to correct.  She also needs cues while on the nustep to have the foot in.  The second walk she had less c/o knee pain, breathing has been the limiting factor in distanced walked recently, she reports that she has MD appointments regarding her lungs    Patient is a 83 y.o. female who was seen today for physical therapy evaluation and treatment for debility,    she has not been able to walk or transfer or exercise much since D/C from PT in May.  I have seen her off and on over the past 20+ years and she is having a lot of difficulty controlling the left knee, it buckles and goes into varus and does some hyperextension, with  walking 40 feet today she reported knee pain a 7/10,    OBJECTIVE IMPAIRMENTS: Abnormal gait, cardiopulmonary status limiting activity, decreased activity tolerance, decreased balance, decreased coordination, decreased endurance, decreased mobility, difficulty walking, decreased ROM, decreased strength, increased edema, impaired flexibility, improper body mechanics, postural dysfunction, and pain.   REHAB POTENTIAL: Good  CLINICAL DECISION MAKING: Stable/uncomplicated  EVALUATION COMPLEXITY: Low   GOALS: Goals reviewed with patient? Yes  SHORT TERM GOALS: Target date: 05/28/24 Independent with advanced HEP Goal status: progressing as she has one caregive that can do this 05/20/24  LONG TERM GOALS: Target date: 08/04/24  Independent with advanced HEP with caregiver Goal status: ongoing 06/24/24  2.  Transfer with set up and CGA Goal status: will probably not meet  this goal she needs Min A 07/15/24  3.  Walk HHA x 150 feet Goal status: Progressing 07/17/24 able to do 80 and 70 feet, cues needed for foot placement  4.  Increase left LE strength to 4/5 Goal status: progressing 07/10/24  5.  Report neck and shoulder pain decreased 50% Goal status: progressing 07/10/24, Progressing 20% 07/15/24  PLAN:  PT FREQUENCY: 2x/week  PT DURATION: 12 weeks  PLANNED INTERVENTIONS: Therapeutic exercises, Therapeutic activity, Neuromuscular re-education, Balance training, Gait training, Patient/Family education, Self Care, Joint mobilization, Moist heat, Taping, and Manual therapy  PLAN FOR NEXT SESSION: advance activities really work on stability of the left knee, hip adduction.    OBADIAH OZELL ORN, PT 07/17/2024, 11:00 AM

## 2024-07-21 DIAGNOSIS — Z23 Encounter for immunization: Secondary | ICD-10-CM | POA: Diagnosis not present

## 2024-07-22 ENCOUNTER — Ambulatory Visit: Admitting: Physical Therapy

## 2024-07-22 ENCOUNTER — Encounter: Payer: Self-pay | Admitting: Physical Therapy

## 2024-07-22 DIAGNOSIS — M25512 Pain in left shoulder: Secondary | ICD-10-CM | POA: Diagnosis not present

## 2024-07-22 DIAGNOSIS — M6281 Muscle weakness (generalized): Secondary | ICD-10-CM | POA: Diagnosis not present

## 2024-07-22 DIAGNOSIS — R2681 Unsteadiness on feet: Secondary | ICD-10-CM

## 2024-07-22 DIAGNOSIS — R270 Ataxia, unspecified: Secondary | ICD-10-CM | POA: Diagnosis not present

## 2024-07-22 DIAGNOSIS — R262 Difficulty in walking, not elsewhere classified: Secondary | ICD-10-CM | POA: Diagnosis not present

## 2024-07-22 DIAGNOSIS — I69354 Hemiplegia and hemiparesis following cerebral infarction affecting left non-dominant side: Secondary | ICD-10-CM

## 2024-07-22 NOTE — Therapy (Signed)
 OUTPATIENT PHYSICAL THERAPY LOWER EXTREMITY TREATMENT     Patient Name: Karen Jackson MRN: [id number] DOB:Jun 02, 1941, 83 y.o., female Today's Date: 07/22/2024  END OF SESSION:  PT End of Session - 07/22/24 1148     Visit Number 22    Date for Recertification  08/04/24    PT Start Time 1145    PT Stop Time 1230    PT Time Calculation (min) 45 min    Activity Tolerance Patient tolerated treatment well    Behavior During Therapy [facility] for tasks assessed/performed            Past Medical History:  Diagnosis Date   Acute cystitis without hematuria    Acute diastolic CHF (congestive heart failure) (HCC)    Arthritis    Dyspnea    Dysrhythmia    Fever of unknown origin 03/19/2017   Hyperlipidemia    Hypertension    denies at preop   Multifocal pneumonia    Neuromuscular disorder (HCC)    neuropathy left arm and foot   Osteopenia    Paralysis (HCC)    partial left side from CVA    Persistent atrial fibrillation (HCC)    PONV (postoperative nausea and vomiting)    Pre-diabetes    Stroke [facility]) 2013   hemmorahgic   Past Surgical History:  Procedure Laterality Date   ANKLE SURGERY     APPENDECTOMY     CHOLECYSTECTOMY     HARDWARE REMOVAL Left 03/29/2023   Procedure: HARDWARE REMOVAL;  Surgeon: Kit Rush, MD;  Location: [facility];  Service: Orthopedics;  Laterality: Left;   HERNIA REPAIR     Esophagus   INCISION AND DRAINAGE OF WOUND Left 03/29/2023   Procedure: LEFT ANKLE WOUND IRRIGATION AND DEBRIDEMENT WOUND;  Surgeon: Kit Rush, MD;  Location: [facility];  Service: Orthopedics;  Laterality: Left;   JOINT REPLACEMENT     total- right partial- left   MASTECTOMY PARTIAL / LUMPECTOMY  2012   left   ORIF ANKLE FRACTURE Left 07/20/2018   Procedure: OPEN REDUCTION INTERNAL FIXATION (ORIF) ANKLE FRACTURE;  Surgeon: Kit Rush, MD;  Location: [facility];  Service: Orthopedics;  Laterality: Left;   TOTAL KNEE ARTHROPLASTY Left 01/27/2019    Procedure: TOTAL KNEE ARTHROPLASTY;  Surgeon: Rubie Kemps, MD;  Location: [facility];  Service: Orthopedics;  Laterality: Left;   Patient Active Problem List   Diagnosis Date Noted   Senile osteoporosis 06/02/2024   Goals of care, counseling/discussion 01/17/2022   Grief 01/17/2022   Secondary hypercoagulable state 03/16/2021   Dizziness 03/10/2021   Orthostatic hypotension 10/15/2020   Presbycusis of both ears 03/08/2020   Mixed stress and urge urinary incontinence 12/02/2019   Macrocytosis 12/01/2019   Nutritional anemia 12/01/2019   S/P total knee replacement 01/27/2019   Recurrent left knee instability 07/05/2018   Respiratory failure with hypoxia (HCC) 08/30/2017   Hypoxemia    Heart failure with preserved ejection fraction (HCC), Grade 3 diastolic dysfunction 03/26/2017   PAF (paroxysmal atrial fibrillation) (HCC)    Dyspnea 03/19/2017   Encounter for preventive health examination 02/17/2016   Sensorineural hearing loss (SNHL), bilateral 01/26/2016   Hypomagnesemia 04/24/2014   Hemiparesis affecting left side as late effect of cerebrovascular accident (HCC) 04/24/2014   Nontraumatic cerebral hemorrhage (HCC) 04/30/2012   DM (diabetes mellitus) with complications (HCC) 03/04/2010   OSTEOPENIA 01/21/2009   UNSPECIFIED VITAMIN D  DEFICIENCY 11/19/2007   HYPERCHOLESTEROLEMIA 10/25/2006   GASTROESOPHAGEAL REFLUX, NO ESOPHAGITIS 10/25/2006   DIVERTICULOSIS OF COLON 10/25/2006  Osteoarthritis 10/25/2006   CERVICAL SPINE DISORDER, NOS 10/25/2006    PCP: Shayne, MD  REFERRING PROVIDER: Shayne, MD  REFERRING DIAG: debility  THERAPY DIAG:  Muscle weakness (generalized)  Unsteadiness on feet  Difficulty in walking, not elsewhere classified  Hemiplegia and hemiparesis following cerebral infarction affecting left non-dominant side (HCC)  Left shoulder pain, unspecified chronicity  Rationale for Evaluation and Treatment: Rehabilitation  ONSET DATE: 04/29/24  SUBJECTIVE:    SUBJECTIVE STATEMENT: Pretty good, a little soreness in the knee and the shoulder, the usual  Patient has been a patient of mine off and on for many years, she has had many different diagnoses, she had a CVA about 13 years ago, she had a left ankle fracture with ORIF, she had a lot of issues with pain and wounds, so the hardware was removed last August, she has done pretty well but tends to back slide without PT with her ability to walk and transfer.  She has a caregiver about 11 hours a day.  She has been able to walk with me in PT, no one else has walked with her   PERTINENT HISTORY: CVA, CHF, HTN, A-fib, TKA PAIN:  Are you having pain? Yes: NPRS scale:  /10 Pain location: shoulder Pain description: sore Aggravating factors: neck and shoulder always hurt, knee and ankle hurt with transfers Relieving factors: massage, heat  PRECAUTIONS: None  RED FLAGS: None   WEIGHT BEARING RESTRICTIONS: No  FALLS:  Has patient fallen in last 6 months? No  LIVING ENVIRONMENT: Lives with: lives alone Lives in: House/apartment Stairs: No Has following equipment at home: Environmental Consultant - 2 wheeled, Wheelchair (manual), shower chair, bed side commode, Grab bars, and Ramped entry care giver 11 hours a day  OCCUPATION: retired  PLOF: Needs assistance with homemaking, Needs assistance with transfers, and Leisure: plays bridge, she has 11 hours of an aide at home that helps with meals, going to MD's, dressing and bathing, is alone at night  PATIENT GOALS: walk, have less pain, transfer without difficulty  NEXT MD VISIT: none scheduled  OBJECTIVE:   DIAGNOSTIC FINDINGS: none performed  COGNITION: Overall cognitive status: Within functional limits for tasks assessed     SENSATION: WFL POSTURE: rounded shoulders and forward head  PALPATION: Left knee and ankle are swollen and tender, left upper and neck  LOWER EXTREMITY ROM:  Active ROM Left eval Left 05/15/24 Left 07/15/24 Left 07/22/24   Hip flexion      Hip extension      Hip abduction      Hip adduction      Hip internal rotation      Hip external rotation      Knee flexion 90 91 93 95  Knee extension 0 0 0   Ankle dorsiflexion 5     Ankle plantarflexion 30     Ankle inversion 5     Ankle eversion 0      (Blank rows = not tested)  LOWER EXTREMITY MMT:  MMT Right eval Left eval Right 07/15/24 Left 07/15/24 Left 07/22/24  Hip flexion 3+ 3+ 4- 4- 4  Hip extension       Hip abduction 4- 3+ 4 4   Hip adduction 4- 3+     Hip internal rotation       Hip external rotation       Knee flexion 4- 3+ 4 4- 4  Knee extension 4- 3+ 4 4 4   Ankle dorsiflexion  3+  4 4  Ankle plantarflexion  Ankle inversion       Ankle eversion        (Blank rows = not tested)  FUNCTIONAL TESTS:  Transfers Max A, left knee gives out and goes into ER and tends to bow into varus, has not walked since I discharged her 12/27/23.    GAIT: Distance walked: with HHA and w/c follow x 40 feet with pain in the left knee a 7/10, she tends to rotate the left hip posterior and really bring the left knee into extension   TODAY'S TREATMENT:                                                                                                                              DATE:  07/22/24 Nustep level 5 x 8 minutes 5# LAQ and marches Red tband HS curl Red tband hip adduction, abduction Gait HHA and w/c follow 60 feet, RLE externally rotated  07/17/24 Nustep level 5 x 8 minutes STM to the left upper trap and neck area Gait 80 feet with HHA and w/c follow Red tband hip adduction, abduction Red tband HS curl Red tband ankle PF/DF 5# LAQ and marches Gait HHA and w/c follow 70 feet,   07/15/24 STM to UT and cervical spine L digit approximation NuStep L 5 x 8 min Goals  LAQ 3lb 2x10 Seated March 3lb 2x10 Hs curls green 2x10 STM to the left upper trap and neck Gait with HHA and w/c follow x 100 feet one seated rest   07/10/24 Nustep level  5 x 9 minutes Standing weight shifts Standing left leg marches 3# marches and LAQ seated Red tband HS curls Red tband left hip only adduction and abduction Red tband left ankle motions STM to the left upper trap and neck Educated caregiver (new today) on transfers and transfer set up and safety Gait with HHA and w/c follow x 100 feet  07/08/24 NuStep L 5 x 8 min 500 steps STM to UT and cervical spine L digit approximation 5# LAQ 2x10 5# hip flexion seated 2 sets 10 HS curls red 2x10 STS with min A 5 x Gait with min HHA and W/C follow 17ft w/ one seated rest  07/03/24 STM to UT and cervical spine L digit approximation NuStep L 5 x 7 min 500 steps 5# LAQ 2x10 HS curls red 2x10 LLE ankle PF/DF green 2x10 Gait with HHA and W/C follow 119ft w/ one seated rest  06/30/24 NuStep L5x  3# marches  3# LAQ 2x15 Red band HS curls 2x10 STM to upper trap and neck Gait with HHA and W/C follow 2x70ft  06/24/24 Nustep level 5 x 8 minutes Seated partial sit ups Seated elbow touches each side 2# marches unsupported back 2# LAQ unsupported back 2 toe touches with HHA STM to the left upper trap and neck Unsupported sitting ball bounce Gait with HHA and w/c follow 2x90 feet  06/19/24 Nustep level 5 x 9 minutes Gait  HHA with w/c follow 110 feet 5# LAQ 5# marches Red tband left hip adduction Red tband left hip abduction STM to the left upper trap and neck Gait again made it 80 feet and had to stop due to fatigue  06/17/24 Nustep level 4 x 8 minutes Standing march  Side steps at mat  HS curls red 2x10 LAQ 3lb 2x10 STM to UT and cervical spine  Some with Tgun Gait 70ft HHA and W/c follow x 90 feet  06/12/24 Nustep level 5 x 8 minutes Supine bridges Supine rotations with knees tied together with red tband and feet on ball then bridges with legs on ball Isometric abs with ball Use of sliding board hip abduction and hip/knee flexion Red tband left hip adduction in  hooklying Blue tband left hip extension in supine Gait with HHA and W/c follow x 90 feet  06/10/24 Nustep level 5 x 8 minutesm cues to keep left knee in STM to the left upper trap, neck and rhomboid 4# LAQ 4# marches Red tband HS curls Red tbnd ankle PF/DF Yellow tband with her really focused on the left only hip adduction and abduction Gait with HHA and W/C follow 2 x75 feet  06/05/24 Nustep level 5 x 8 minutes a lot of cues to keep left knee in STM to the left upper trap and the rhomboid and into the neck Gait with HHA and w/c follow 90 feet some medial left knee pain 3# MArches 3# LAQ Red tband left hip adduction Red tband HS curls Weight 145# with 2 person assist to get on scale Gait with HHA and w/c follow x100 feet   PATIENT EDUCATION:  Education details: POC Person educated: Patient Education method: Explanation Education comprehension: verbalized understanding  HOME EXERCISE PROGRAM: 06/10/24  Marches, LAQ, ankle PF/DF, left hip adduction and abduction  ASSESSMENT:  CLINICAL IMPRESSION: Patient today decrease gait tolerance due to RLE externally rotated during gait. Slight improvement made with L knee flexion.   She also needs cues while on the nustep to bring L knee in.  Cues for eccentirc control needed with LAQ. Pt sat early on three transfers not fully turning despite cues    Patient is a 83 y.o. female who was seen today for physical therapy evaluation and treatment for debility,    she has not been able to walk or transfer or exercise much since D/C from PT in May.  I have seen her off and on over the past 20+ years and she is having a lot of difficulty controlling the left knee, it buckles and goes into varus and does some hyperextension, with walking 40 feet today she reported knee pain a 7/10,    OBJECTIVE IMPAIRMENTS: Abnormal gait, cardiopulmonary status limiting activity, decreased activity tolerance, decreased balance, decreased coordination, decreased  endurance, decreased mobility, difficulty walking, decreased ROM, decreased strength, increased edema, impaired flexibility, improper body mechanics, postural dysfunction, and pain.   REHAB POTENTIAL: Good  CLINICAL DECISION MAKING: Stable/uncomplicated  EVALUATION COMPLEXITY: Low   GOALS: Goals reviewed with patient? Yes  SHORT TERM GOALS: Target date: 05/28/24 Independent with advanced HEP Goal status: progressing as she has one caregive that can do this 05/20/24  LONG TERM GOALS: Target date: 08/04/24  Independent with advanced HEP with caregiver Goal status: ongoing 06/24/24  2.  Transfer with set up and CGA Goal status: will probably not meet this goal she needs Min A 07/15/24  3.  Walk HHA x 150 feet Goal status: Progressing 07/17/24 able to  do 80 and 70 feet, cues needed for foot placement  4.  Increase left LE strength to 4/5 Goal status: progressing 07/10/24  5.  Report neck and shoulder pain decreased 50% Goal status: progressing 07/10/24, Progressing 20% 07/15/24  PLAN:  PT FREQUENCY: 2x/week  PT DURATION: 12 weeks  PLANNED INTERVENTIONS: Therapeutic exercises, Therapeutic activity, Neuromuscular re-education, Balance training, Gait training, Patient/Family education, Self Care, Joint mobilization, Moist heat, Taping, and Manual therapy  PLAN FOR NEXT SESSION: advance activities really work on stability of the left knee, hip adduction.    Tanda KANDICE Sorrow, PTA 07/22/2024, 11:49 AM

## 2024-07-29 ENCOUNTER — Encounter: Payer: Self-pay | Admitting: Physical Therapy

## 2024-07-29 ENCOUNTER — Ambulatory Visit: Attending: Internal Medicine | Admitting: Physical Therapy

## 2024-07-29 DIAGNOSIS — R2681 Unsteadiness on feet: Secondary | ICD-10-CM | POA: Insufficient documentation

## 2024-07-29 DIAGNOSIS — I69354 Hemiplegia and hemiparesis following cerebral infarction affecting left non-dominant side: Secondary | ICD-10-CM | POA: Insufficient documentation

## 2024-07-29 DIAGNOSIS — M25572 Pain in left ankle and joints of left foot: Secondary | ICD-10-CM | POA: Insufficient documentation

## 2024-07-29 DIAGNOSIS — M542 Cervicalgia: Secondary | ICD-10-CM | POA: Insufficient documentation

## 2024-07-29 DIAGNOSIS — M25512 Pain in left shoulder: Secondary | ICD-10-CM | POA: Insufficient documentation

## 2024-07-29 DIAGNOSIS — R270 Ataxia, unspecified: Secondary | ICD-10-CM | POA: Insufficient documentation

## 2024-07-29 DIAGNOSIS — R293 Abnormal posture: Secondary | ICD-10-CM | POA: Diagnosis present

## 2024-07-29 DIAGNOSIS — M6281 Muscle weakness (generalized): Secondary | ICD-10-CM | POA: Insufficient documentation

## 2024-07-29 DIAGNOSIS — I6930 Unspecified sequelae of cerebral infarction: Secondary | ICD-10-CM | POA: Insufficient documentation

## 2024-07-29 DIAGNOSIS — R262 Difficulty in walking, not elsewhere classified: Secondary | ICD-10-CM | POA: Insufficient documentation

## 2024-07-29 NOTE — Therapy (Signed)
 OUTPATIENT PHYSICAL THERAPY LOWER EXTREMITY TREATMENT     Patient Name: Karen Jackson MRN: [id number] DOB:07/06/1941, 83 y.o., female Today's Date: 07/29/2024  END OF SESSION:  PT End of Session - 07/29/24 1018     Visit Number 23    Date for Recertification  08/04/24    PT Start Time 1015    PT Stop Time 1100    PT Time Calculation (min) 45 min    Activity Tolerance Patient tolerated treatment well    Behavior During Therapy [facility] for tasks assessed/performed            Past Medical History:  Diagnosis Date   Acute cystitis without hematuria    Acute diastolic CHF (congestive heart failure) (HCC)    Arthritis    Dyspnea    Dysrhythmia    Fever of unknown origin 03/19/2017   Hyperlipidemia    Hypertension    denies at preop   Multifocal pneumonia    Neuromuscular disorder (HCC)    neuropathy left arm and foot   Osteopenia    Paralysis (HCC)    partial left side from CVA    Persistent atrial fibrillation (HCC)    PONV (postoperative nausea and vomiting)    Pre-diabetes    Stroke [facility]) 2013   hemmorahgic   Past Surgical History:  Procedure Laterality Date   ANKLE SURGERY     APPENDECTOMY     CHOLECYSTECTOMY     HARDWARE REMOVAL Left 03/29/2023   Procedure: HARDWARE REMOVAL;  Surgeon: Kit Rush, MD;  Location: [facility];  Service: Orthopedics;  Laterality: Left;   HERNIA REPAIR     Esophagus   INCISION AND DRAINAGE OF WOUND Left 03/29/2023   Procedure: LEFT ANKLE WOUND IRRIGATION AND DEBRIDEMENT WOUND;  Surgeon: Kit Rush, MD;  Location: [facility];  Service: Orthopedics;  Laterality: Left;   JOINT REPLACEMENT     total- right partial- left   MASTECTOMY PARTIAL / LUMPECTOMY  2012   left   ORIF ANKLE FRACTURE Left 07/20/2018   Procedure: OPEN REDUCTION INTERNAL FIXATION (ORIF) ANKLE FRACTURE;  Surgeon: Kit Rush, MD;  Location: [facility];  Service: Orthopedics;  Laterality: Left;   TOTAL KNEE ARTHROPLASTY Left 01/27/2019    Procedure: TOTAL KNEE ARTHROPLASTY;  Surgeon: Rubie Kemps, MD;  Location: [facility];  Service: Orthopedics;  Laterality: Left;   Patient Active Problem List   Diagnosis Date Noted   Senile osteoporosis 06/02/2024   Goals of care, counseling/discussion 01/17/2022   Grief 01/17/2022   Secondary hypercoagulable state 03/16/2021   Dizziness 03/10/2021   Orthostatic hypotension 10/15/2020   Presbycusis of both ears 03/08/2020   Mixed stress and urge urinary incontinence 12/02/2019   Macrocytosis 12/01/2019   Nutritional anemia 12/01/2019   S/P total knee replacement 01/27/2019   Recurrent left knee instability 07/05/2018   Respiratory failure with hypoxia (HCC) 08/30/2017   Hypoxemia    Heart failure with preserved ejection fraction (HCC), Grade 3 diastolic dysfunction 03/26/2017   PAF (paroxysmal atrial fibrillation) (HCC)    Dyspnea 03/19/2017   Encounter for preventive health examination 02/17/2016   Sensorineural hearing loss (SNHL), bilateral 01/26/2016   Hypomagnesemia 04/24/2014   Hemiparesis affecting left side as late effect of cerebrovascular accident (HCC) 04/24/2014   Nontraumatic cerebral hemorrhage (HCC) 04/30/2012   DM (diabetes mellitus) with complications (HCC) 03/04/2010   OSTEOPENIA 01/21/2009   UNSPECIFIED VITAMIN D  DEFICIENCY 11/19/2007   HYPERCHOLESTEROLEMIA 10/25/2006   GASTROESOPHAGEAL REFLUX, NO ESOPHAGITIS 10/25/2006   DIVERTICULOSIS OF COLON 10/25/2006  Osteoarthritis 10/25/2006   CERVICAL SPINE DISORDER, NOS 10/25/2006    PCP: Shayne, MD  REFERRING PROVIDER: Shayne, MD  REFERRING DIAG: debility  THERAPY DIAG:  Muscle weakness (generalized)  Unsteadiness on feet  Difficulty in walking, not elsewhere classified  Rationale for Evaluation and Treatment: Rehabilitation  ONSET DATE: 04/29/24  SUBJECTIVE:   SUBJECTIVE STATEMENT: Had a massage yesterday, shoulders not as sore  Patient has been a patient of mine off and on for many years, she has  had many different diagnoses, she had a CVA about 13 years ago, she had a left ankle fracture with ORIF, she had a lot of issues with pain and wounds, so the hardware was removed last August, she has done pretty well but tends to back slide without PT with her ability to walk and transfer.  She has a caregiver about 11 hours a day.  She has been able to walk with me in PT, no one else has walked with her   PERTINENT HISTORY: CVA, CHF, HTN, A-fib, TKA PAIN:  Are you having pain? Yes: NPRS scale: 5/10 Pain location: knee Pain description: sore Aggravating factors: neck and shoulder always hurt, knee and ankle hurt with transfers Relieving factors: massage, heat  PRECAUTIONS: None  RED FLAGS: None   WEIGHT BEARING RESTRICTIONS: No  FALLS:  Has patient fallen in last 6 months? No  LIVING ENVIRONMENT: Lives with: lives alone Lives in: House/apartment Stairs: No Has following equipment at home: Environmental Consultant - 2 wheeled, Wheelchair (manual), shower chair, bed side commode, Grab bars, and Ramped entry care giver 11 hours a day  OCCUPATION: retired  PLOF: Needs assistance with homemaking, Needs assistance with transfers, and Leisure: plays bridge, she has 11 hours of an aide at home that helps with meals, going to MD's, dressing and bathing, is alone at night  PATIENT GOALS: walk, have less pain, transfer without difficulty  NEXT MD VISIT: none scheduled  OBJECTIVE:   DIAGNOSTIC FINDINGS: none performed  COGNITION: Overall cognitive status: Within functional limits for tasks assessed     SENSATION: WFL POSTURE: rounded shoulders and forward head  PALPATION: Left knee and ankle are swollen and tender, left upper and neck  LOWER EXTREMITY ROM:  Active ROM Left eval Left 05/15/24 Left 07/15/24 Left 07/22/24  Hip flexion      Hip extension      Hip abduction      Hip adduction      Hip internal rotation      Hip external rotation      Knee flexion 90 91 93 95  Knee extension  0 0 0   Ankle dorsiflexion 5     Ankle plantarflexion 30     Ankle inversion 5     Ankle eversion 0      (Blank rows = not tested)  LOWER EXTREMITY MMT:  MMT Right eval Left eval Right 07/15/24 Left 07/15/24 Left 07/22/24  Hip flexion 3+ 3+ 4- 4- 4  Hip extension       Hip abduction 4- 3+ 4 4   Hip adduction 4- 3+     Hip internal rotation       Hip external rotation       Knee flexion 4- 3+ 4 4- 4  Knee extension 4- 3+ 4 4 4   Ankle dorsiflexion  3+  4 4  Ankle plantarflexion       Ankle inversion       Ankle eversion        (Blank  rows = not tested)  FUNCTIONAL TESTS:  Transfers Max A, left knee gives out and goes into ER and tends to bow into varus, has not walked since I discharged her 12/27/23.    GAIT: Distance walked: with HHA and w/c follow x 40 feet with pain in the left knee a 7/10, she tends to rotate the left hip posterior and really bring the left knee into extension   TODAY'S TREATMENT:                                                                                                                              DATE:  07/29/24 Nustep level 5 x 8 minutes HS curls red 2x15 LAQ 5lb 2x15 Seated March 5lb 2x10  L ankle PR/DF red 2x10 Red tband hip adduction, abduction LLE only  Gait HHA and w/c follow 60 feet, 44ft, RLE externally rotated  07/22/24 Nustep level 5 x 8 minutes 5# LAQ and marches Red tband HS curl Red tband hip adduction, abduction Gait HHA and w/c follow 60 feet, RLE externally rotated  07/17/24 Nustep level 5 x 8 minutes STM to the left upper trap and neck area Gait 80 feet with HHA and w/c follow Red tband hip adduction, abduction Red tband HS curl Red tband ankle PF/DF 5# LAQ and marches Gait HHA and w/c follow 70 feet,   07/15/24 STM to UT and cervical spine L digit approximation NuStep L 5 x 8 min Goals  LAQ 3lb 2x10 Seated March 3lb 2x10 Hs curls green 2x10 STM to the left upper trap and neck Gait with HHA and w/c follow x  100 feet one seated rest   07/10/24 Nustep level 5 x 9 minutes Standing weight shifts Standing left leg marches 3# marches and LAQ seated Red tband HS curls Red tband left hip only adduction and abduction Red tband left ankle motions STM to the left upper trap and neck Educated caregiver (new today) on transfers and transfer set up and safety Gait with HHA and w/c follow x 100 feet  07/08/24 NuStep L 5 x 8 min 500 steps STM to UT and cervical spine L digit approximation 5# LAQ 2x10 5# hip flexion seated 2 sets 10 HS curls red 2x10 STS with min A 5 x Gait with min HHA and W/C follow 171ft w/ one seated rest  07/03/24 STM to UT and cervical spine L digit approximation NuStep L 5 x 7 min 500 steps 5# LAQ 2x10 HS curls red 2x10 LLE ankle PF/DF green 2x10 Gait with HHA and W/C follow 179ft w/ one seated rest  06/30/24 NuStep L5x  3# marches  3# LAQ 2x15 Red band HS curls 2x10 STM to upper trap and neck Gait with HHA and W/C follow 2x67ft  06/24/24 Nustep level 5 x 8 minutes Seated partial sit ups Seated elbow touches each side 2# marches unsupported back 2# LAQ unsupported back 2 toe touches with HHA STM to the  left upper trap and neck Unsupported sitting ball bounce Gait with HHA and w/c follow 2x90 feet  06/19/24 Nustep level 5 x 9 minutes Gait HHA with w/c follow 110 feet 5# LAQ 5# marches Red tband left hip adduction Red tband left hip abduction STM to the left upper trap and neck Gait again made it 80 feet and had to stop due to fatigue  06/17/24 Nustep level 4 x 8 minutes Standing march  Side steps at mat  HS curls red 2x10 LAQ 3lb 2x10 STM to UT and cervical spine  Some with Tgun Gait 42ft HHA and W/c follow x 90 feet  06/12/24 Nustep level 5 x 8 minutes Supine bridges Supine rotations with knees tied together with red tband and feet on ball then bridges with legs on ball Isometric abs with ball Use of sliding board hip abduction and  hip/knee flexion Red tband left hip adduction in hooklying Blue tband left hip extension in supine Gait with HHA and W/c follow x 90 feet  06/10/24 Nustep level 5 x 8 minutesm cues to keep left knee in STM to the left upper trap, neck and rhomboid 4# LAQ 4# marches Red tband HS curls Red tbnd ankle PF/DF Yellow tband with her really focused on the left only hip adduction and abduction Gait with HHA and W/C follow 2 x75 feet  06/05/24 Nustep level 5 x 8 minutes a lot of cues to keep left knee in STM to the left upper trap and the rhomboid and into the neck Gait with HHA and w/c follow 90 feet some medial left knee pain 3# MArches 3# LAQ Red tband left hip adduction Red tband HS curls Weight 145# with 2 person assist to get on scale Gait with HHA and w/c follow x100 feet   PATIENT EDUCATION:  Education details: POC Person educated: Patient Education method: Explanation Education comprehension: verbalized understanding  HOME EXERCISE PROGRAM: 06/10/24  Marches, LAQ, ankle PF/DF, left hip adduction and abduction  ASSESSMENT:  CLINICAL IMPRESSION: Pt enters dong well. Session completed at [facility] table without back support for increase core engagement. Seated marches the most taxing of the seated interventions. Cue for full ROM and eccentric control needed with HS curls and LAQ. Increase fatigue with gait.    Patient is a 83 y.o. female who was seen today for physical therapy evaluation and treatment for debility,    she has not been able to walk or transfer or exercise much since D/C from PT in May.  I have seen her off and on over the past 20+ years and she is having a lot of difficulty controlling the left knee, it buckles and goes into varus and does some hyperextension, with walking 40 feet today she reported knee pain a 7/10,    OBJECTIVE IMPAIRMENTS: Abnormal gait, cardiopulmonary status limiting activity, decreased activity tolerance, decreased balance, decreased  coordination, decreased endurance, decreased mobility, difficulty walking, decreased ROM, decreased strength, increased edema, impaired flexibility, improper body mechanics, postural dysfunction, and pain.   REHAB POTENTIAL: Good  CLINICAL DECISION MAKING: Stable/uncomplicated  EVALUATION COMPLEXITY: Low   GOALS: Goals reviewed with patient? Yes  SHORT TERM GOALS: Target date: 05/28/24 Independent with advanced HEP Goal status: progressing as she has one caregive that can do this 05/20/24  LONG TERM GOALS: Target date: 08/04/24  Independent with advanced HEP with caregiver Goal status: ongoing 06/24/24  2.  Transfer with set up and CGA Goal status: will probably not meet this goal she needs Min A  07/15/24  3.  Walk HHA x 150 feet Goal status: Progressing 07/17/24 able to do 80 and 70 feet, cues needed for foot placement  4.  Increase left LE strength to 4/5 Goal status: progressing 07/10/24  5.  Report neck and shoulder pain decreased 50% Goal status: progressing 07/10/24, Progressing 20% 07/15/24  PLAN:  PT FREQUENCY: 2x/week  PT DURATION: 12 weeks  PLANNED INTERVENTIONS: Therapeutic exercises, Therapeutic activity, Neuromuscular re-education, Balance training, Gait training, Patient/Family education, Self Care, Joint mobilization, Moist heat, Taping, and Manual therapy  PLAN FOR NEXT SESSION: advance activities really work on stability of the left knee, hip adduction.    Tanda KANDICE Sorrow, PTA 07/29/2024, 10:18 AM

## 2024-07-31 ENCOUNTER — Encounter: Payer: Self-pay | Admitting: Physical Therapy

## 2024-07-31 ENCOUNTER — Ambulatory Visit: Admitting: Physical Therapy

## 2024-07-31 DIAGNOSIS — I69354 Hemiplegia and hemiparesis following cerebral infarction affecting left non-dominant side: Secondary | ICD-10-CM

## 2024-07-31 DIAGNOSIS — R262 Difficulty in walking, not elsewhere classified: Secondary | ICD-10-CM

## 2024-07-31 DIAGNOSIS — M6281 Muscle weakness (generalized): Secondary | ICD-10-CM | POA: Diagnosis not present

## 2024-07-31 DIAGNOSIS — R2681 Unsteadiness on feet: Secondary | ICD-10-CM

## 2024-07-31 DIAGNOSIS — M25512 Pain in left shoulder: Secondary | ICD-10-CM

## 2024-07-31 NOTE — Therapy (Signed)
 OUTPATIENT PHYSICAL THERAPY LOWER EXTREMITY TREATMENT     Patient Name: Karen Jackson MRN: [id number] DOB:12-29-1940, 83 y.o., female Today's Date: 07/31/2024  END OF SESSION:  PT End of Session - 07/31/24 1104     Visit Number 24    PT Start Time 1100    PT Stop Time 1145    PT Time Calculation (min) 45 min    Activity Tolerance Patient tolerated treatment well    Behavior During Therapy WFL for tasks assessed/performed            Past Medical History:  Diagnosis Date   Acute cystitis without hematuria    Acute diastolic CHF (congestive heart failure) (HCC)    Arthritis    Dyspnea    Dysrhythmia    Fever of unknown origin 03/19/2017   Hyperlipidemia    Hypertension    denies at preop   Multifocal pneumonia    Neuromuscular disorder (HCC)    neuropathy left arm and foot   Osteopenia    Paralysis (HCC)    partial left side from CVA    Persistent atrial fibrillation (HCC)    PONV (postoperative nausea and vomiting)    Pre-diabetes    Stroke [facility]) 2013   hemmorahgic   Past Surgical History:  Procedure Laterality Date   ANKLE SURGERY     APPENDECTOMY     CHOLECYSTECTOMY     HARDWARE REMOVAL Left 03/29/2023   Procedure: HARDWARE REMOVAL;  Surgeon: Kit Rush, MD;  Location: [facility];  Service: Orthopedics;  Laterality: Left;   HERNIA REPAIR     Esophagus   INCISION AND DRAINAGE OF WOUND Left 03/29/2023   Procedure: LEFT ANKLE WOUND IRRIGATION AND DEBRIDEMENT WOUND;  Surgeon: Kit Rush, MD;  Location: [facility];  Service: Orthopedics;  Laterality: Left;   JOINT REPLACEMENT     total- right partial- left   MASTECTOMY PARTIAL / LUMPECTOMY  2012   left   ORIF ANKLE FRACTURE Left 07/20/2018   Procedure: OPEN REDUCTION INTERNAL FIXATION (ORIF) ANKLE FRACTURE;  Surgeon: Kit Rush, MD;  Location: [facility];  Service: Orthopedics;  Laterality: Left;   TOTAL KNEE ARTHROPLASTY Left 01/27/2019   Procedure: TOTAL KNEE ARTHROPLASTY;  Surgeon:  Rubie Kemps, MD;  Location: [facility];  Service: Orthopedics;  Laterality: Left;   Patient Active Problem List   Diagnosis Date Noted   Senile osteoporosis 06/02/2024   Goals of care, counseling/discussion 01/17/2022   Grief 01/17/2022   Secondary hypercoagulable state 03/16/2021   Dizziness 03/10/2021   Orthostatic hypotension 10/15/2020   Presbycusis of both ears 03/08/2020   Mixed stress and urge urinary incontinence 12/02/2019   Macrocytosis 12/01/2019   Nutritional anemia 12/01/2019   S/P total knee replacement 01/27/2019   Recurrent left knee instability 07/05/2018   Respiratory failure with hypoxia (HCC) 08/30/2017   Hypoxemia    Heart failure with preserved ejection fraction (HCC), Grade 3 diastolic dysfunction 03/26/2017   PAF (paroxysmal atrial fibrillation) (HCC)    Dyspnea 03/19/2017   Encounter for preventive health examination 02/17/2016   Sensorineural hearing loss (SNHL), bilateral 01/26/2016   Hypomagnesemia 04/24/2014   Hemiparesis affecting left side as late effect of cerebrovascular accident (HCC) 04/24/2014   Nontraumatic cerebral hemorrhage (HCC) 04/30/2012   DM (diabetes mellitus) with complications (HCC) 03/04/2010   OSTEOPENIA 01/21/2009   UNSPECIFIED VITAMIN D  DEFICIENCY 11/19/2007   HYPERCHOLESTEROLEMIA 10/25/2006   GASTROESOPHAGEAL REFLUX, NO ESOPHAGITIS 10/25/2006   DIVERTICULOSIS OF COLON 10/25/2006   Osteoarthritis 10/25/2006   CERVICAL SPINE  DISORDER, NOS 10/25/2006    PCP: Shayne, MD  REFERRING PROVIDER: Shayne, MD  REFERRING DIAG: debility  THERAPY DIAG:  Muscle weakness (generalized)  Unsteadiness on feet  Difficulty in walking, not elsewhere classified  Hemiplegia and hemiparesis following cerebral infarction affecting left non-dominant side (HCC)  Left shoulder pain, unspecified chronicity  Rationale for Evaluation and Treatment: Rehabilitation  ONSET DATE: 04/29/24  SUBJECTIVE:   SUBJECTIVE STATEMENT: A little sore in both  shoulders and L knee  Patient has been a patient of mine off and on for many years, she has had many different diagnoses, she had a CVA about 13 years ago, she had a left ankle fracture with ORIF, she had a lot of issues with pain and wounds, so the hardware was removed last August, she has done pretty well but tends to back slide without PT with her ability to walk and transfer.  She has a caregiver about 11 hours a day.  She has been able to walk with me in PT, no one else has walked with her   PERTINENT HISTORY: CVA, CHF, HTN, A-fib, TKA PAIN:  Are you having pain? Yes: NPRS scale: 6/10 Pain location: knee Pain description: sore Aggravating factors: neck and shoulder always hurt, knee and ankle hurt with transfers Relieving factors: massage, heat  PRECAUTIONS: None  RED FLAGS: None   WEIGHT BEARING RESTRICTIONS: No  FALLS:  Has patient fallen in last 6 months? No  LIVING ENVIRONMENT: Lives with: lives alone Lives in: House/apartment Stairs: No Has following equipment at home: Environmental Consultant - 2 wheeled, Wheelchair (manual), shower chair, bed side commode, Grab bars, and Ramped entry care giver 11 hours a day  OCCUPATION: retired  PLOF: Needs assistance with homemaking, Needs assistance with transfers, and Leisure: plays bridge, she has 11 hours of an aide at home that helps with meals, going to MD's, dressing and bathing, is alone at night  PATIENT GOALS: walk, have less pain, transfer without difficulty  NEXT MD VISIT: none scheduled  OBJECTIVE:   DIAGNOSTIC FINDINGS: none performed  COGNITION: Overall cognitive status: Within functional limits for tasks assessed     SENSATION: WFL POSTURE: rounded shoulders and forward head  PALPATION: Left knee and ankle are swollen and tender, left upper and neck  LOWER EXTREMITY ROM:  Active ROM Left eval Left 05/15/24 Left 07/15/24 Left 07/22/24 07/31/24 Left  Hip flexion       Hip extension       Hip abduction       Hip  adduction       Hip internal rotation       Hip external rotation       Knee flexion 90 91 93 95 95  Knee extension 0 0 0 0 0  Ankle dorsiflexion 5      Ankle plantarflexion 30      Ankle inversion 5      Ankle eversion 0       (Blank rows = not tested)  LOWER EXTREMITY MMT:  MMT Right eval Left eval Right 07/15/24 Left 07/15/24 Left 07/22/24 Left 07/31/24  Hip flexion 3+ 3+ 4- 4- 4 4  Hip extension        Hip abduction 4- 3+ 4 4    Hip adduction 4- 3+      Hip internal rotation        Hip external rotation        Knee flexion 4- 3+ 4 4- 4 4  Knee extension 4- 3+ 4 4  4 5  Ankle dorsiflexion  3+  4 4 5   Ankle plantarflexion        Ankle inversion        Ankle eversion         (Blank rows = not tested)  FUNCTIONAL TESTS:  Transfers Max A, left knee gives out and goes into ER and tends to bow into varus, has not walked since I discharged her 12/27/23.    GAIT: Distance walked: with HHA and w/c follow x 40 feet with pain in the left knee a 7/10, she tends to rotate the left hip posterior and really bring the left knee into extension   TODAY'S TREATMENT:                                                                                                                              DATE:  07/31/24 NuStep L 5 x 9 min STM to UT and cervical spine Approximation t0 L digits  Goals Red tband hip adduction, abduction LLE only  LAQ RLE 5lb 2x15 HS curls RLE red 2x15 Gait HHA and w/c follow 100 feet  07/29/24 Nustep level 5 x 8 minutes HS curls red 2x15 LAQ 5lb 2x15 Seated March 5lb 2x10  L ankle PR/DF red 2x10 Red tband hip adduction, abduction LLE only  Gait HHA and w/c follow 60 feet, 52ft, RLE externally rotated  07/22/24 Nustep level 5 x 8 minutes 5# LAQ and marches Red tband HS curl Red tband hip adduction, abduction Gait HHA and w/c follow 60 feet, RLE externally rotated  07/17/24 Nustep level 5 x 8 minutes STM to the left upper trap and neck area Gait 80 feet with  HHA and w/c follow Red tband hip adduction, abduction Red tband HS curl Red tband ankle PF/DF 5# LAQ and marches Gait HHA and w/c follow 70 feet,   07/15/24 STM to UT and cervical spine L digit approximation NuStep L 5 x 8 min Goals  LAQ 3lb 2x10 Seated March 3lb 2x10 Hs curls green 2x10 STM to the left upper trap and neck Gait with HHA and w/c follow x 100 feet one seated rest   07/10/24 Nustep level 5 x 9 minutes Standing weight shifts Standing left leg marches 3# marches and LAQ seated Red tband HS curls Red tband left hip only adduction and abduction Red tband left ankle motions STM to the left upper trap and neck Educated caregiver (new today) on transfers and transfer set up and safety Gait with HHA and w/c follow x 100 feet  07/08/24 NuStep L 5 x 8 min 500 steps STM to UT and cervical spine L digit approximation 5# LAQ 2x10 5# hip flexion seated 2 sets 10 HS curls red 2x10 STS with min A 5 x Gait with min HHA and W/C follow 158ft w/ one seated rest  07/03/24 STM to UT and cervical spine L digit approximation NuStep L 5 x 7 min 500 steps 5#  LAQ 2x10 HS curls red 2x10 LLE ankle PF/DF green 2x10 Gait with HHA and W/C follow 125ft w/ one seated rest  06/30/24 NuStep L5x  3# marches  3# LAQ 2x15 Red band HS curls 2x10 STM to upper trap and neck Gait with HHA and W/C follow 2x52ft  06/24/24 Nustep level 5 x 8 minutes Seated partial sit ups Seated elbow touches each side 2# marches unsupported back 2# LAQ unsupported back 2 toe touches with HHA STM to the left upper trap and neck Unsupported sitting ball bounce Gait with HHA and w/c follow 2x90 feet  06/19/24 Nustep level 5 x 9 minutes Gait HHA with w/c follow 110 feet 5# LAQ 5# marches Red tband left hip adduction Red tband left hip abduction STM to the left upper trap and neck Gait again made it 80 feet and had to stop due to fatigue  06/17/24 Nustep level 4 x 8 minutes Standing  march  Side steps at mat  HS curls red 2x10 LAQ 3lb 2x10 STM to UT and cervical spine  Some with Tgun Gait 58ft HHA and W/c follow x 90 feet  06/12/24 Nustep level 5 x 8 minutes Supine bridges Supine rotations with knees tied together with red tband and feet on ball then bridges with legs on ball Isometric abs with ball Use of sliding board hip abduction and hip/knee flexion Red tband left hip adduction in hooklying Blue tband left hip extension in supine Gait with HHA and W/c follow x 90 feet  06/10/24 Nustep level 5 x 8 minutesm cues to keep left knee in STM to the left upper trap, neck and rhomboid 4# LAQ 4# marches Red tband HS curls Red tbnd ankle PF/DF Yellow tband with her really focused on the left only hip adduction and abduction Gait with HHA and W/C follow 2 x75 feet  06/05/24 Nustep level 5 x 8 minutes a lot of cues to keep left knee in STM to the left upper trap and the rhomboid and into the neck Gait with HHA and w/c follow 90 feet some medial left knee pain 3# MArches 3# LAQ Red tband left hip adduction Red tband HS curls Weight 145# with 2 person assist to get on scale Gait with HHA and w/c follow x100 feet   PATIENT EDUCATION:  Education details: POC Person educated: Patient Education method: Explanation Education comprehension: verbalized understanding  HOME EXERCISE PROGRAM: 06/10/24  Marches, LAQ, ankle PF/DF, left hip adduction and abduction  ASSESSMENT:  CLINICAL IMPRESSION: Pt enters dong well.  Pt has progressed increasoing her LE strength meeting goal. Assine still is required with transfers. Positive response to STM improving tissue elasticity in UT. Cue for full ROM and eccentric control needed with HS curls and LAQ. Increase fatigue with gait.    Patient is a 83 y.o. female who was seen today for physical therapy evaluation and treatment for debility,    she has not been able to walk or transfer or exercise much since D/C from PT in  May.  I have seen her off and on over the past 20+ years and she is having a lot of difficulty controlling the left knee, it buckles and goes into varus and does some hyperextension, with walking 40 feet today she reported knee pain a 7/10,    OBJECTIVE IMPAIRMENTS: Abnormal gait, cardiopulmonary status limiting activity, decreased activity tolerance, decreased balance, decreased coordination, decreased endurance, decreased mobility, difficulty walking, decreased ROM, decreased strength, increased edema, impaired flexibility, improper body mechanics,  postural dysfunction, and pain.   REHAB POTENTIAL: Good  CLINICAL DECISION MAKING: Stable/uncomplicated  EVALUATION COMPLEXITY: Low   GOALS: Goals reviewed with patient? Yes  SHORT TERM GOALS: Target date: 05/28/24 Independent with advanced HEP Goal status: progressing as she has one caregive that can do this 05/20/24  LONG TERM GOALS: Target date: 08/04/24  Independent with advanced HEP with caregiver Goal status: ongoing 06/24/24  2.  Transfer with set up and CGA Goal status: will probably not meet this goal she needs Min A 07/15/24, Ongoing 07/31/24  3.  Walk HHA x 150 feet Goal status: Progressing 07/17/24 able to do 80 and 70 feet, cues needed for foot placement, Progressing 07/31/24  4.  Increase left LE strength to 4/5 Goal status: progressing 07/10/24, Met 07/31/24  5.  Report neck and shoulder pain decreased 50% Goal status: progressing 07/10/24, Progressing 20% 07/15/24  PLAN:  PT FREQUENCY: 2x/week  PT DURATION: 12 weeks  PLANNED INTERVENTIONS: Therapeutic exercises, Therapeutic activity, Neuromuscular re-education, Balance training, Gait training, Patient/Family education, Self Care, Joint mobilization, Moist heat, Taping, and Manual therapy  PLAN FOR NEXT SESSION: advance activities really work on stability of the left knee, hip adduction.    Tanda KANDICE Sorrow, PTA 07/31/2024, 11:05 AM

## 2024-08-01 ENCOUNTER — Ambulatory Visit (HOSPITAL_COMMUNITY)
Admission: RE | Admit: 2024-08-01 | Discharge: 2024-08-01 | Disposition: A | Source: Ambulatory Visit | Attending: Internal Medicine | Admitting: Internal Medicine

## 2024-08-01 ENCOUNTER — Encounter (HOSPITAL_COMMUNITY): Payer: Self-pay

## 2024-08-01 DIAGNOSIS — J849 Interstitial pulmonary disease, unspecified: Secondary | ICD-10-CM | POA: Diagnosis not present

## 2024-08-01 DIAGNOSIS — J398 Other specified diseases of upper respiratory tract: Secondary | ICD-10-CM | POA: Diagnosis not present

## 2024-08-01 DIAGNOSIS — J479 Bronchiectasis, uncomplicated: Secondary | ICD-10-CM | POA: Diagnosis not present

## 2024-08-01 DIAGNOSIS — R059 Cough, unspecified: Secondary | ICD-10-CM | POA: Diagnosis not present

## 2024-08-01 DIAGNOSIS — R918 Other nonspecific abnormal finding of lung field: Secondary | ICD-10-CM | POA: Diagnosis not present

## 2024-08-04 ENCOUNTER — Telehealth: Payer: Self-pay

## 2024-08-04 DIAGNOSIS — H6123 Impacted cerumen, bilateral: Secondary | ICD-10-CM | POA: Diagnosis not present

## 2024-08-04 DIAGNOSIS — Z974 Presence of external hearing-aid: Secondary | ICD-10-CM | POA: Diagnosis not present

## 2024-08-04 NOTE — Telephone Encounter (Signed)
 Copied from CRM #[id number]. Topic: Clinical - Lab/Test Results >> Aug 04, 2024  4:28 PM Isabell A wrote: Reason for CRM: Patient would like for her provider Candis Dandy to review her CT results and give her a call back.   Callback number: [phone]

## 2024-08-05 ENCOUNTER — Ambulatory Visit: Admitting: Physical Therapy

## 2024-08-05 ENCOUNTER — Encounter: Payer: Self-pay | Admitting: Physical Therapy

## 2024-08-05 DIAGNOSIS — I6930 Unspecified sequelae of cerebral infarction: Secondary | ICD-10-CM

## 2024-08-05 DIAGNOSIS — I69354 Hemiplegia and hemiparesis following cerebral infarction affecting left non-dominant side: Secondary | ICD-10-CM

## 2024-08-05 DIAGNOSIS — R262 Difficulty in walking, not elsewhere classified: Secondary | ICD-10-CM

## 2024-08-05 DIAGNOSIS — R270 Ataxia, unspecified: Secondary | ICD-10-CM

## 2024-08-05 DIAGNOSIS — M542 Cervicalgia: Secondary | ICD-10-CM

## 2024-08-05 DIAGNOSIS — M25572 Pain in left ankle and joints of left foot: Secondary | ICD-10-CM

## 2024-08-05 DIAGNOSIS — M25512 Pain in left shoulder: Secondary | ICD-10-CM

## 2024-08-05 DIAGNOSIS — M6281 Muscle weakness (generalized): Secondary | ICD-10-CM | POA: Diagnosis not present

## 2024-08-05 DIAGNOSIS — R2681 Unsteadiness on feet: Secondary | ICD-10-CM

## 2024-08-05 NOTE — Therapy (Signed)
 OUTPATIENT PHYSICAL THERAPY LOWER EXTREMITY TREATMENT     Patient Name: Karen Jackson MRN: [id number] DOB:1940/11/13, 83 y.o., female Today's Date: 08/05/2024  END OF SESSION:  PT End of Session - 08/05/24 1106     Visit Number 25    Date for Recertification  09/05/24    Authorization Type Medicare    PT Start Time 1100    PT Stop Time 1145    PT Time Calculation (min) 45 min    Activity Tolerance Patient tolerated treatment well    Behavior During Therapy [facility] for tasks assessed/performed            Past Medical History:  Diagnosis Date   Acute cystitis without hematuria    Acute diastolic CHF (congestive heart failure) (HCC)    Arthritis    Dyspnea    Dysrhythmia    Fever of unknown origin 03/19/2017   Hyperlipidemia    Hypertension    denies at preop   Multifocal pneumonia    Neuromuscular disorder (HCC)    neuropathy left arm and foot   Osteopenia    Paralysis (HCC)    partial left side from CVA    Persistent atrial fibrillation (HCC)    PONV (postoperative nausea and vomiting)    Pre-diabetes    Stroke [facility]) 2013   hemmorahgic   Past Surgical History:  Procedure Laterality Date   ANKLE SURGERY     APPENDECTOMY     CHOLECYSTECTOMY     HARDWARE REMOVAL Left 03/29/2023   Procedure: HARDWARE REMOVAL;  Surgeon: Kit Rush, MD;  Location: [facility];  Service: Orthopedics;  Laterality: Left;   HERNIA REPAIR     Esophagus   INCISION AND DRAINAGE OF WOUND Left 03/29/2023   Procedure: LEFT ANKLE WOUND IRRIGATION AND DEBRIDEMENT WOUND;  Surgeon: Kit Rush, MD;  Location: [facility];  Service: Orthopedics;  Laterality: Left;   JOINT REPLACEMENT     total- right partial- left   MASTECTOMY PARTIAL / LUMPECTOMY  2012   left   ORIF ANKLE FRACTURE Left 07/20/2018   Procedure: OPEN REDUCTION INTERNAL FIXATION (ORIF) ANKLE FRACTURE;  Surgeon: Kit Rush, MD;  Location: [facility];  Service: Orthopedics;  Laterality: Left;   TOTAL KNEE  ARTHROPLASTY Left 01/27/2019   Procedure: TOTAL KNEE ARTHROPLASTY;  Surgeon: Rubie Kemps, MD;  Location: [facility];  Service: Orthopedics;  Laterality: Left;   Patient Active Problem List   Diagnosis Date Noted   Senile osteoporosis 06/02/2024   Goals of care, counseling/discussion 01/17/2022   Grief 01/17/2022   Secondary hypercoagulable state 03/16/2021   Dizziness 03/10/2021   Orthostatic hypotension 10/15/2020   Presbycusis of both ears 03/08/2020   Mixed stress and urge urinary incontinence 12/02/2019   Macrocytosis 12/01/2019   Nutritional anemia 12/01/2019   S/P total knee replacement 01/27/2019   Recurrent left knee instability 07/05/2018   Respiratory failure with hypoxia (HCC) 08/30/2017   Hypoxemia    Heart failure with preserved ejection fraction (HCC), Grade 3 diastolic dysfunction 03/26/2017   PAF (paroxysmal atrial fibrillation) (HCC)    Dyspnea 03/19/2017   Encounter for preventive health examination 02/17/2016   Sensorineural hearing loss (SNHL), bilateral 01/26/2016   Hypomagnesemia 04/24/2014   Hemiparesis affecting left side as late effect of cerebrovascular accident (HCC) 04/24/2014   Nontraumatic cerebral hemorrhage (HCC) 04/30/2012   DM (diabetes mellitus) with complications (HCC) 03/04/2010   OSTEOPENIA 01/21/2009   UNSPECIFIED VITAMIN D  DEFICIENCY 11/19/2007   HYPERCHOLESTEROLEMIA 10/25/2006   GASTROESOPHAGEAL REFLUX, NO ESOPHAGITIS 10/25/2006  DIVERTICULOSIS OF COLON 10/25/2006   Osteoarthritis 10/25/2006   CERVICAL SPINE DISORDER, NOS 10/25/2006    PCP: Shayne, MD  REFERRING PROVIDER: Shayne, MD  REFERRING DIAG: debility  THERAPY DIAG:  Muscle weakness (generalized)  Unsteadiness on feet  Difficulty in walking, not elsewhere classified  Hemiplegia and hemiparesis following cerebral infarction affecting left non-dominant side (HCC)  Left shoulder pain, unspecified chronicity  Ataxia  Late effects of CVA (cerebrovascular accident)  Pain  in left ankle and joints of left foot  Cervicalgia  Rationale for Evaluation and Treatment: Rehabilitation  ONSET DATE: 04/29/24  SUBJECTIVE:   SUBJECTIVE STATEMENT: A little sore in both shoulders and L knee, doing okay, no issues lately  Patient has been a patient of mine off and on for many years, she has had many different diagnoses, she had a CVA about 13 years ago, she had a left ankle fracture with ORIF, she had a lot of issues with pain and wounds, so the hardware was removed last August, she has done pretty well but tends to back slide without PT with her ability to walk and transfer.  She has a caregiver about 11 hours a day.  She has been able to walk with me in PT, no one else has walked with her   PERTINENT HISTORY: CVA, CHF, HTN, A-fib, TKA PAIN:  Are you having pain? Yes: NPRS scale: 6/10 Pain location: knee Pain description: sore Aggravating factors: neck and shoulder always hurt, knee and ankle hurt with transfers Relieving factors: massage, heat  PRECAUTIONS: None  RED FLAGS: None   WEIGHT BEARING RESTRICTIONS: No  FALLS:  Has patient fallen in last 6 months? No  LIVING ENVIRONMENT: Lives with: lives alone Lives in: House/apartment Stairs: No Has following equipment at home: Environmental Consultant - 2 wheeled, Wheelchair (manual), shower chair, bed side commode, Grab bars, and Ramped entry care giver 11 hours a day  OCCUPATION: retired  PLOF: Needs assistance with homemaking, Needs assistance with transfers, and Leisure: plays bridge, she has 11 hours of an aide at home that helps with meals, going to MD's, dressing and bathing, is alone at night  PATIENT GOALS: walk, have less pain, transfer without difficulty  NEXT MD VISIT: none scheduled  OBJECTIVE:   DIAGNOSTIC FINDINGS: none performed  COGNITION: Overall cognitive status: Within functional limits for tasks assessed     SENSATION: WFL POSTURE: rounded shoulders and forward head  PALPATION: Left knee  and ankle are swollen and tender, left upper and neck  LOWER EXTREMITY ROM:  Active ROM Left eval Left 05/15/24 Left 07/15/24 Left 07/22/24 07/31/24 Left  Hip flexion       Hip extension       Hip abduction       Hip adduction       Hip internal rotation       Hip external rotation       Knee flexion 90 91 93 95 95  Knee extension 0 0 0 0 0  Ankle dorsiflexion 5      Ankle plantarflexion 30      Ankle inversion 5      Ankle eversion 0       (Blank rows = not tested)  LOWER EXTREMITY MMT:  MMT Right eval Left eval Right 07/15/24 Left 07/15/24 Left 07/22/24 Left 07/31/24  Hip flexion 3+ 3+ 4- 4- 4 4  Hip extension        Hip abduction 4- 3+ 4 4    Hip adduction 4- 3+  Hip internal rotation        Hip external rotation        Knee flexion 4- 3+ 4 4- 4 4  Knee extension 4- 3+ 4 4 4 5   Ankle dorsiflexion  3+  4 4 5   Ankle plantarflexion        Ankle inversion        Ankle eversion         (Blank rows = not tested)  FUNCTIONAL TESTS:  Transfers Max A, left knee gives out and goes into ER and tends to bow into varus, has not walked since I discharged her 12/27/23.    GAIT: Distance walked: with HHA and w/c follow x 40 feet with pain in the left knee a 7/10, she tends to rotate the left hip posterior and really bring the left knee into extension   TODAY'S TREATMENT:                                                                                                                              DATE:  08/05/24 STM to the left upper trap area Nustep level 5 x 8 minutes Seated partial sit ups Seated elbow touches to side 5# LAQ 5# marches Blue tband clamshells Gait with HHA and w/c follow 120 feet then a rest and 60 feet  07/31/24 NuStep L 5 x 9 min STM to UT and cervical spine Approximation t0 L digits  Goals Red tband hip adduction, abduction LLE only  LAQ RLE 5lb 2x15 HS curls RLE red 2x15 Gait HHA and w/c follow 100 feet  07/29/24 Nustep level 5 x 8 minutes HS  curls red 2x15 LAQ 5lb 2x15 Seated March 5lb 2x10  L ankle PR/DF red 2x10 Red tband hip adduction, abduction LLE only  Gait HHA and w/c follow 60 feet, 46ft, RLE externally rotated  07/22/24 Nustep level 5 x 8 minutes 5# LAQ and marches Red tband HS curl Red tband hip adduction, abduction Gait HHA and w/c follow 60 feet, RLE externally rotated  07/17/24 Nustep level 5 x 8 minutes STM to the left upper trap and neck area Gait 80 feet with HHA and w/c follow Red tband hip adduction, abduction Red tband HS curl Red tband ankle PF/DF 5# LAQ and marches Gait HHA and w/c follow 70 feet,   07/15/24 STM to UT and cervical spine L digit approximation NuStep L 5 x 8 min Goals  LAQ 3lb 2x10 Seated March 3lb 2x10 Hs curls green 2x10 STM to the left upper trap and neck Gait with HHA and w/c follow x 100 feet one seated rest   07/10/24 Nustep level 5 x 9 minutes Standing weight shifts Standing left leg marches 3# marches and LAQ seated Red tband HS curls Red tband left hip only adduction and abduction Red tband left ankle motions STM to the left upper trap and neck Educated caregiver (new today) on transfers and transfer set up and safety  Gait with HHA and w/c follow x 100 feet  07/08/24 NuStep L 5 x 8 min 500 steps STM to UT and cervical spine L digit approximation 5# LAQ 2x10 5# hip flexion seated 2 sets 10 HS curls red 2x10 STS with min A 5 x Gait with min HHA and W/C follow 124ft w/ one seated rest  07/03/24 STM to UT and cervical spine L digit approximation NuStep L 5 x 7 min 500 steps 5# LAQ 2x10 HS curls red 2x10 LLE ankle PF/DF green 2x10 Gait with HHA and W/C follow 143ft w/ one seated rest  06/30/24 NuStep L5x  3# marches  3# LAQ 2x15 Red band HS curls 2x10 STM to upper trap and neck Gait with HHA and W/C follow 2x3ft  06/24/24 Nustep level 5 x 8 minutes Seated partial sit ups Seated elbow touches each side 2# marches unsupported back 2#  LAQ unsupported back 2 toe touches with HHA STM to the left upper trap and neck Unsupported sitting ball bounce Gait with HHA and w/c follow 2x90 feet  06/19/24 Nustep level 5 x 9 minutes Gait HHA with w/c follow 110 feet 5# LAQ 5# marches Red tband left hip adduction Red tband left hip abduction STM to the left upper trap and neck Gait again made it 80 feet and had to stop due to fatigue  06/17/24 Nustep level 4 x 8 minutes Standing march  Side steps at mat  HS curls red 2x10 LAQ 3lb 2x10 STM to UT and cervical spine  Some with Tgun Gait 68ft HHA and W/c follow x 90 feet  06/12/24 Nustep level 5 x 8 minutes Supine bridges Supine rotations with knees tied together with red tband and feet on ball then bridges with legs on ball Isometric abs with ball Use of sliding board hip abduction and hip/knee flexion Red tband left hip adduction in hooklying Blue tband left hip extension in supine Gait with HHA and W/c follow x 90 feet  06/10/24 Nustep level 5 x 8 minutesm cues to keep left knee in STM to the left upper trap, neck and rhomboid 4# LAQ 4# marches Red tband HS curls Red tbnd ankle PF/DF Yellow tband with her really focused on the left only hip adduction and abduction Gait with HHA and W/C follow 2 x75 feet  06/05/24 Nustep level 5 x 8 minutes a lot of cues to keep left knee in STM to the left upper trap and the rhomboid and into the neck Gait with HHA and w/c follow 90 feet some medial left knee pain 3# MArches 3# LAQ Red tband left hip adduction Red tband HS curls Weight 145# with 2 person assist to get on scale Gait with HHA and w/c follow x100 feet   PATIENT EDUCATION:  Education details: POC Person educated: Patient Education method: Explanation Education comprehension: verbalized understanding  HOME EXERCISE PROGRAM: 06/10/24  Marches, LAQ, ankle PF/DF, left hip adduction and abduction  ASSESSMENT:  CLINICAL IMPRESSION: Pt enters dong well.  I added a little more core work today, she fatigued easily with this, she is awaiting the results of lung scan.  Today she was able to walk the farthest that she has done on this round.  No pain in the knee or ankle, just the SOB. She had some difficulty with the 5# LAQ amd marches without back support today.    Patient is a 83 y.o. female who was seen today for physical therapy evaluation and treatment for debility,  she has not been able to walk or transfer or exercise much since D/C from PT in May.  I have seen her off and on over the past 20+ years and she is having a lot of difficulty controlling the left knee, it buckles and goes into varus and does some hyperextension, with walking 40 feet today she reported knee pain a 7/10,    OBJECTIVE IMPAIRMENTS: Abnormal gait, cardiopulmonary status limiting activity, decreased activity tolerance, decreased balance, decreased coordination, decreased endurance, decreased mobility, difficulty walking, decreased ROM, decreased strength, increased edema, impaired flexibility, improper body mechanics, postural dysfunction, and pain.   REHAB POTENTIAL: Good  CLINICAL DECISION MAKING: Stable/uncomplicated  EVALUATION COMPLEXITY: Low   GOALS: Goals reviewed with patient? Yes  SHORT TERM GOALS: Target date: 05/28/24 Independent with advanced HEP Goal status: progressing as she has one caregive that can do this 05/20/24  LONG TERM GOALS: Target date: 08/04/24  Independent with advanced HEP with caregiver Goal status: progressing 08/05/24  2.  Transfer with set up and CGA Goal status: will probably not meet this goal she needs Min A 07/15/24, Ongoing 07/31/24  3.  Walk HHA x 150 feet Goal status: Progressing 07/17/24 able to do 80 and 70 feet, cues needed for foot placement, Progressing 08/05/24  4.  Increase left LE strength to 4/5 Goal status: progressing 07/10/24, Met 07/31/24  5.  Report neck and shoulder pain decreased 50% Goal status:  progressing 07/10/24, Progressing 20% 07/15/24  08/05/24  PLAN:  PT FREQUENCY: 2x/week  PT DURATION: 12 weeks  PLANNED INTERVENTIONS: Therapeutic exercises, Therapeutic activity, Neuromuscular re-education, Balance training, Gait training, Patient/Family education, Self Care, Joint mobilization, Moist heat, Taping, and Manual therapy  PLAN FOR NEXT SESSION: advance activities really work on stability of the left knee, hip adduction. Will ask for more visits on this renewal   Gehrig Patras W, PT 08/05/2024, 11:07 AM

## 2024-08-05 NOTE — Telephone Encounter (Signed)
 Chest CT reviewed.  Findings on CT are unchanged.  We can discuss further at follow up.  No changes in therapy at this time.

## 2024-08-07 ENCOUNTER — Ambulatory Visit: Admitting: Physical Therapy

## 2024-08-07 ENCOUNTER — Encounter: Payer: Self-pay | Admitting: Physical Therapy

## 2024-08-07 DIAGNOSIS — I6930 Unspecified sequelae of cerebral infarction: Secondary | ICD-10-CM

## 2024-08-07 DIAGNOSIS — M6281 Muscle weakness (generalized): Secondary | ICD-10-CM | POA: Diagnosis not present

## 2024-08-07 DIAGNOSIS — I69354 Hemiplegia and hemiparesis following cerebral infarction affecting left non-dominant side: Secondary | ICD-10-CM

## 2024-08-07 DIAGNOSIS — R262 Difficulty in walking, not elsewhere classified: Secondary | ICD-10-CM

## 2024-08-07 DIAGNOSIS — M25572 Pain in left ankle and joints of left foot: Secondary | ICD-10-CM

## 2024-08-07 DIAGNOSIS — R293 Abnormal posture: Secondary | ICD-10-CM

## 2024-08-07 DIAGNOSIS — M542 Cervicalgia: Secondary | ICD-10-CM

## 2024-08-07 DIAGNOSIS — R270 Ataxia, unspecified: Secondary | ICD-10-CM

## 2024-08-07 DIAGNOSIS — R2681 Unsteadiness on feet: Secondary | ICD-10-CM

## 2024-08-07 DIAGNOSIS — M25512 Pain in left shoulder: Secondary | ICD-10-CM

## 2024-08-07 NOTE — Therapy (Signed)
 OUTPATIENT PHYSICAL THERAPY LOWER EXTREMITY TREATMENT     Patient Name: Karen Jackson MRN: [id number] DOB:12-02-1940, 83 y.o., female Today's Date: 08/07/2024  END OF SESSION:  PT End of Session - 08/07/24 1110     Visit Number 26    Date for Recertification  09/05/24    Authorization Type Medicare    PT Start Time 1055    PT Stop Time 1140    PT Time Calculation (min) 45 min    Activity Tolerance Patient tolerated treatment well    Behavior During Therapy [facility] for tasks assessed/performed            Past Medical History:  Diagnosis Date   Acute cystitis without hematuria    Acute diastolic CHF (congestive heart failure) (HCC)    Arthritis    Dyspnea    Dysrhythmia    Fever of unknown origin 03/19/2017   Hyperlipidemia    Hypertension    denies at preop   Multifocal pneumonia    Neuromuscular disorder (HCC)    neuropathy left arm and foot   Osteopenia    Paralysis (HCC)    partial left side from CVA    Persistent atrial fibrillation (HCC)    PONV (postoperative nausea and vomiting)    Pre-diabetes    Stroke [facility]) 2013   hemmorahgic   Past Surgical History:  Procedure Laterality Date   ANKLE SURGERY     APPENDECTOMY     CHOLECYSTECTOMY     HARDWARE REMOVAL Left 03/29/2023   Procedure: HARDWARE REMOVAL;  Surgeon: Kit Rush, MD;  Location: [facility];  Service: Orthopedics;  Laterality: Left;   HERNIA REPAIR     Esophagus   INCISION AND DRAINAGE OF WOUND Left 03/29/2023   Procedure: LEFT ANKLE WOUND IRRIGATION AND DEBRIDEMENT WOUND;  Surgeon: Kit Rush, MD;  Location: [facility];  Service: Orthopedics;  Laterality: Left;   JOINT REPLACEMENT     total- right partial- left   MASTECTOMY PARTIAL / LUMPECTOMY  2012   left   ORIF ANKLE FRACTURE Left 07/20/2018   Procedure: OPEN REDUCTION INTERNAL FIXATION (ORIF) ANKLE FRACTURE;  Surgeon: Kit Rush, MD;  Location: [facility] OR;  Service: Orthopedics;  Laterality: Left;   TOTAL KNEE  ARTHROPLASTY Left 01/27/2019   Procedure: TOTAL KNEE ARTHROPLASTY;  Surgeon: Rubie Kemps, MD;  Location: [facility];  Service: Orthopedics;  Laterality: Left;   Patient Active Problem List   Diagnosis Date Noted   Senile osteoporosis 06/02/2024   Goals of care, counseling/discussion 01/17/2022   Grief 01/17/2022   Secondary hypercoagulable state 03/16/2021   Dizziness 03/10/2021   Orthostatic hypotension 10/15/2020   Presbycusis of both ears 03/08/2020   Mixed stress and urge urinary incontinence 12/02/2019   Macrocytosis 12/01/2019   Nutritional anemia 12/01/2019   S/P total knee replacement 01/27/2019   Recurrent left knee instability 07/05/2018   Respiratory failure with hypoxia (HCC) 08/30/2017   Hypoxemia    Heart failure with preserved ejection fraction (HCC), Grade 3 diastolic dysfunction 03/26/2017   PAF (paroxysmal atrial fibrillation) (HCC)    Dyspnea 03/19/2017   Encounter for preventive health examination 02/17/2016   Sensorineural hearing loss (SNHL), bilateral 01/26/2016   Hypomagnesemia 04/24/2014   Hemiparesis affecting left side as late effect of cerebrovascular accident (HCC) 04/24/2014   Nontraumatic cerebral hemorrhage (HCC) 04/30/2012   DM (diabetes mellitus) with complications (HCC) 03/04/2010   OSTEOPENIA 01/21/2009   UNSPECIFIED VITAMIN D  DEFICIENCY 11/19/2007   HYPERCHOLESTEROLEMIA 10/25/2006   GASTROESOPHAGEAL REFLUX, NO ESOPHAGITIS 10/25/2006  DIVERTICULOSIS OF COLON 10/25/2006   Osteoarthritis 10/25/2006   CERVICAL SPINE DISORDER, NOS 10/25/2006    PCP: Shayne, MD  REFERRING PROVIDER: Shayne, MD  REFERRING DIAG: debility  THERAPY DIAG:  Muscle weakness (generalized)  Unsteadiness on feet  Difficulty in walking, not elsewhere classified  Hemiplegia and hemiparesis following cerebral infarction affecting left non-dominant side (HCC)  Left shoulder pain, unspecified chronicity  Ataxia  Late effects of CVA (cerebrovascular accident)  Pain  in left ankle and joints of left foot  Cervicalgia  Abnormal posture  Rationale for Evaluation and Treatment: Rehabilitation  ONSET DATE: 04/29/24  SUBJECTIVE:   SUBJECTIVE STATEMENT: Neck and shoulder are sore, knees a little stiff, maybe the colder weather, reports her core was a little sore after the last treatment  Patient has been a patient of mine off and on for many years, she has had many different diagnoses, she had a CVA about 13 years ago, she had a left ankle fracture with ORIF, she had a lot of issues with pain and wounds, so the hardware was removed last August, she has done pretty well but tends to back slide without PT with her ability to walk and transfer.  She has a caregiver about 11 hours a day.  She has been able to walk with me in PT, no one else has walked with her   PERTINENT HISTORY: CVA, CHF, HTN, A-fib, TKA PAIN:  Are you having pain? Yes: NPRS scale: 6/10 Pain location: knee Pain description: sore Aggravating factors: neck and shoulder always hurt, knee and ankle hurt with transfers Relieving factors: massage, heat  PRECAUTIONS: None  RED FLAGS: None   WEIGHT BEARING RESTRICTIONS: No  FALLS:  Has patient fallen in last 6 months? No  LIVING ENVIRONMENT: Lives with: lives alone Lives in: House/apartment Stairs: No Has following equipment at home: Environmental Consultant - 2 wheeled, Wheelchair (manual), shower chair, bed side commode, Grab bars, and Ramped entry care giver 11 hours a day  OCCUPATION: retired  PLOF: Needs assistance with homemaking, Needs assistance with transfers, and Leisure: plays bridge, she has 11 hours of an aide at home that helps with meals, going to MD's, dressing and bathing, is alone at night  PATIENT GOALS: walk, have less pain, transfer without difficulty  NEXT MD VISIT: none scheduled  OBJECTIVE:   DIAGNOSTIC FINDINGS: none performed  COGNITION: Overall cognitive status: Within functional limits for tasks  assessed     SENSATION: WFL POSTURE: rounded shoulders and forward head  PALPATION: Left knee and ankle are swollen and tender, left upper and neck  LOWER EXTREMITY ROM:  Active ROM Left eval Left 05/15/24 Left 07/15/24 Left 07/22/24 07/31/24 Left  Hip flexion       Hip extension       Hip abduction       Hip adduction       Hip internal rotation       Hip external rotation       Knee flexion 90 91 93 95 95  Knee extension 0 0 0 0 0  Ankle dorsiflexion 5      Ankle plantarflexion 30      Ankle inversion 5      Ankle eversion 0       (Blank rows = not tested)  LOWER EXTREMITY MMT:  MMT Right eval Left eval Right 07/15/24 Left 07/15/24 Left 07/22/24 Left 07/31/24  Hip flexion 3+ 3+ 4- 4- 4 4  Hip extension        Hip abduction  4- 3+ 4 4    Hip adduction 4- 3+      Hip internal rotation        Hip external rotation        Knee flexion 4- 3+ 4 4- 4 4  Knee extension 4- 3+ 4 4 4 5   Ankle dorsiflexion  3+  4 4 5   Ankle plantarflexion        Ankle inversion        Ankle eversion         (Blank rows = not tested)  FUNCTIONAL TESTS:  Transfers Max A, left knee gives out and goes into ER and tends to bow into varus, has not walked since I discharged her 12/27/23.    GAIT: Distance walked: with HHA and w/c follow x 40 feet with pain in the left knee a 7/10, she tends to rotate the left hip posterior and really bring the left knee into extension   TODAY'S TREATMENT:                                                                                                                              DATE:  08/07/24 STM to the left upper trap and into the neck and rhomboid Nustep level 5 x 9 minutes 3# LAQ 3# marches Blue tband hip abduction Blue tband ankle PF/DF Gait with HHA and W/C follow x100 feet and then x 50 feet, the left knee was buckling a little more today  08/05/24 STM to the left upper trap area Nustep level 5 x 8 minutes Seated partial sit ups Seated elbow touches  to side 5# LAQ 5# marches Blue tband clamshells Gait with HHA and w/c follow 120 feet then a rest and 60 feet  07/31/24 NuStep L 5 x 9 min STM to UT and cervical spine Approximation t0 L digits  Goals Red tband hip adduction, abduction LLE only  LAQ RLE 5lb 2x15 HS curls RLE red 2x15 Gait HHA and w/c follow 100 feet  07/29/24 Nustep level 5 x 8 minutes HS curls red 2x15 LAQ 5lb 2x15 Seated March 5lb 2x10  L ankle PR/DF red 2x10 Red tband hip adduction, abduction LLE only  Gait HHA and w/c follow 60 feet, 79ft, RLE externally rotated  07/22/24 Nustep level 5 x 8 minutes 5# LAQ and marches Red tband HS curl Red tband hip adduction, abduction Gait HHA and w/c follow 60 feet, RLE externally rotated  07/17/24 Nustep level 5 x 8 minutes STM to the left upper trap and neck area Gait 80 feet with HHA and w/c follow Red tband hip adduction, abduction Red tband HS curl Red tband ankle PF/DF 5# LAQ and marches Gait HHA and w/c follow 70 feet,   07/15/24 STM to UT and cervical spine L digit approximation NuStep L 5 x 8 min Goals  LAQ 3lb 2x10 Seated March 3lb 2x10 Hs curls green 2x10 STM to the left upper trap and neck  Gait with HHA and w/c follow x 100 feet one seated rest   07/10/24 Nustep level 5 x 9 minutes Standing weight shifts Standing left leg marches 3# marches and LAQ seated Red tband HS curls Red tband left hip only adduction and abduction Red tband left ankle motions STM to the left upper trap and neck Educated caregiver (new today) on transfers and transfer set up and safety Gait with HHA and w/c follow x 100 feet  07/08/24 NuStep L 5 x 8 min 500 steps STM to UT and cervical spine L digit approximation 5# LAQ 2x10 5# hip flexion seated 2 sets 10 HS curls red 2x10 STS with min A 5 x Gait with min HHA and W/C follow 120ft w/ one seated rest  07/03/24 STM to UT and cervical spine L digit approximation NuStep L 5 x 7 min 500 steps 5# LAQ  2x10 HS curls red 2x10 LLE ankle PF/DF green 2x10 Gait with HHA and W/C follow 170ft w/ one seated rest  06/30/24 NuStep L5x  3# marches  3# LAQ 2x15 Red band HS curls 2x10 STM to upper trap and neck Gait with HHA and W/C follow 2x17ft  06/24/24 Nustep level 5 x 8 minutes Seated partial sit ups Seated elbow touches each side 2# marches unsupported back 2# LAQ unsupported back 2 toe touches with HHA STM to the left upper trap and neck Unsupported sitting ball bounce Gait with HHA and w/c follow 2x90 feet   PATIENT EDUCATION:  Education details: POC Person educated: Patient Education method: Explanation Education comprehension: verbalized understanding  HOME EXERCISE PROGRAM: 06/10/24  Marches, LAQ, ankle PF/DF, left hip adduction and abduction  ASSESSMENT:  CLINICAL IMPRESSION: Patient was sore from the core work that we did on Tuesday.  She had a little more left knee buckling today so was not able to go as far.  I did go back to the 3# weights as she had to compensate a lot with the 5# but with the 5# she had no back support because we were on the mat table   Patient is a 83 y.o. female who was seen today for physical therapy evaluation and treatment for debility,    she has not been able to walk or transfer or exercise much since D/C from PT in May.  I have seen her off and on over the past 20+ years and she is having a lot of difficulty controlling the left knee, it buckles and goes into varus and does some hyperextension, with walking 40 feet today she reported knee pain a 7/10,    OBJECTIVE IMPAIRMENTS: Abnormal gait, cardiopulmonary status limiting activity, decreased activity tolerance, decreased balance, decreased coordination, decreased endurance, decreased mobility, difficulty walking, decreased ROM, decreased strength, increased edema, impaired flexibility, improper body mechanics, postural dysfunction, and pain.   REHAB POTENTIAL: Good  CLINICAL DECISION  MAKING: Stable/uncomplicated  EVALUATION COMPLEXITY: Low   GOALS: Goals reviewed with patient? Yes  SHORT TERM GOALS: Target date: 05/28/24 Independent with advanced HEP Goal status: progressing as she has one caregive that can do this 05/20/24  LONG TERM GOALS: Target date: 08/04/24  Independent with advanced HEP with caregiver Goal status: progressing 08/05/24  2.  Transfer with set up and CGA Goal status: will probably not meet this goal she needs Min A 07/15/24, Ongoing 07/31/24  3.  Walk HHA x 150 feet Goal status: Progressing 07/17/24 able to do 80 and 70 feet, cues needed for foot placement, Progressing 08/05/24  4.  Increase left LE strength to 4/5 Goal status: progressing 07/10/24, Met 07/31/24  5.  Report neck and shoulder pain decreased 50% Goal status: progressing 07/10/24, Progressing 20% 07/15/24  08/05/24  PLAN:  PT FREQUENCY: 2x/week  PT DURATION: 12 weeks  PLANNED INTERVENTIONS: Therapeutic exercises, Therapeutic activity, Neuromuscular re-education, Balance training, Gait training, Patient/Family education, Self Care, Joint mobilization, Moist heat, Taping, and Manual therapy  PLAN FOR NEXT SESSION: advance activities really work on stability of the left knee, hip adduction. Will ask for more visits on this renewal and decrease to 1x/week   Didier Brandenburg W, PT 08/07/2024, 11:40 AM

## 2024-08-12 ENCOUNTER — Ambulatory Visit: Admitting: Physical Therapy

## 2024-08-12 ENCOUNTER — Encounter: Payer: Self-pay | Admitting: Physical Therapy

## 2024-08-12 DIAGNOSIS — R262 Difficulty in walking, not elsewhere classified: Secondary | ICD-10-CM

## 2024-08-12 DIAGNOSIS — R2681 Unsteadiness on feet: Secondary | ICD-10-CM

## 2024-08-12 DIAGNOSIS — M6281 Muscle weakness (generalized): Secondary | ICD-10-CM

## 2024-08-12 NOTE — Therapy (Signed)
 OUTPATIENT PHYSICAL THERAPY LOWER EXTREMITY TREATMENT     Patient Name: Karen Jackson MRN: [id number] DOB:12-May-1941, 83 y.o., female Today's Date: 08/12/2024  END OF SESSION:  PT End of Session - 08/12/24 1108     Visit Number 27    Date for Recertification  09/05/24    PT Start Time 1100    PT Stop Time 1145    PT Time Calculation (min) 45 min    Activity Tolerance Patient tolerated treatment well    Behavior During Therapy [facility] for tasks assessed/performed            Past Medical History:  Diagnosis Date   Acute cystitis without hematuria    Acute diastolic CHF (congestive heart failure) (HCC)    Arthritis    Dyspnea    Dysrhythmia    Fever of unknown origin 03/19/2017   Hyperlipidemia    Hypertension    denies at preop   Multifocal pneumonia    Neuromuscular disorder (HCC)    neuropathy left arm and foot   Osteopenia    Paralysis (HCC)    partial left side from CVA    Persistent atrial fibrillation (HCC)    PONV (postoperative nausea and vomiting)    Pre-diabetes    Stroke [facility]) 2013   hemmorahgic   Past Surgical History:  Procedure Laterality Date   ANKLE SURGERY     APPENDECTOMY     CHOLECYSTECTOMY     HARDWARE REMOVAL Left 03/29/2023   Procedure: HARDWARE REMOVAL;  Surgeon: Kit Rush, MD;  Location: [facility];  Service: Orthopedics;  Laterality: Left;   HERNIA REPAIR     Esophagus   INCISION AND DRAINAGE OF WOUND Left 03/29/2023   Procedure: LEFT ANKLE WOUND IRRIGATION AND DEBRIDEMENT WOUND;  Surgeon: Kit Rush, MD;  Location: [facility];  Service: Orthopedics;  Laterality: Left;   JOINT REPLACEMENT     total- right partial- left   MASTECTOMY PARTIAL / LUMPECTOMY  2012   left   ORIF ANKLE FRACTURE Left 07/20/2018   Procedure: OPEN REDUCTION INTERNAL FIXATION (ORIF) ANKLE FRACTURE;  Surgeon: Kit Rush, MD;  Location: [facility] OR;  Service: Orthopedics;  Laterality: Left;   TOTAL KNEE ARTHROPLASTY Left 01/27/2019    Procedure: TOTAL KNEE ARTHROPLASTY;  Surgeon: Rubie Kemps, MD;  Location: [facility];  Service: Orthopedics;  Laterality: Left;   Patient Active Problem List   Diagnosis Date Noted   Senile osteoporosis 06/02/2024   Goals of care, counseling/discussion 01/17/2022   Grief 01/17/2022   Secondary hypercoagulable state 03/16/2021   Dizziness 03/10/2021   Orthostatic hypotension 10/15/2020   Presbycusis of both ears 03/08/2020   Mixed stress and urge urinary incontinence 12/02/2019   Macrocytosis 12/01/2019   Nutritional anemia 12/01/2019   S/P total knee replacement 01/27/2019   Recurrent left knee instability 07/05/2018   Respiratory failure with hypoxia (HCC) 08/30/2017   Hypoxemia    Heart failure with preserved ejection fraction (HCC), Grade 3 diastolic dysfunction 03/26/2017   PAF (paroxysmal atrial fibrillation) (HCC)    Dyspnea 03/19/2017   Encounter for preventive health examination 02/17/2016   Sensorineural hearing loss (SNHL), bilateral 01/26/2016   Hypomagnesemia 04/24/2014   Hemiparesis affecting left side as late effect of cerebrovascular accident (HCC) 04/24/2014   Nontraumatic cerebral hemorrhage (HCC) 04/30/2012   DM (diabetes mellitus) with complications (HCC) 03/04/2010   OSTEOPENIA 01/21/2009   UNSPECIFIED VITAMIN D  DEFICIENCY 11/19/2007   HYPERCHOLESTEROLEMIA 10/25/2006   GASTROESOPHAGEAL REFLUX, NO ESOPHAGITIS 10/25/2006   DIVERTICULOSIS OF COLON 10/25/2006  Osteoarthritis 10/25/2006   CERVICAL SPINE DISORDER, NOS 10/25/2006    PCP: Shayne, MD  REFERRING PROVIDER: Shayne, MD  REFERRING DIAG: debility  THERAPY DIAG:  Muscle weakness (generalized)  Difficulty in walking, not elsewhere classified  Unsteadiness on feet  Rationale for Evaluation and Treatment: Rehabilitation  ONSET DATE: 04/29/24  SUBJECTIVE:   SUBJECTIVE STATEMENT: Same shoulder and neck pain  Patient has been a patient of mine off and on for many years, she has had many different  diagnoses, she had a CVA about 13 years ago, she had a left ankle fracture with ORIF, she had a lot of issues with pain and wounds, so the hardware was removed last August, she has done pretty well but tends to back slide without PT with her ability to walk and transfer.  She has a caregiver about 11 hours a day.  She has been able to walk with me in PT, no one else has walked with her   PERTINENT HISTORY: CVA, CHF, HTN, A-fib, TKA PAIN:  Are you having pain? Yes: NPRS scale: 6/10 Pain location: knee Pain description: sore Aggravating factors: neck and shoulder always hurt, knee and ankle hurt with transfers Relieving factors: massage, heat  PRECAUTIONS: None  RED FLAGS: None   WEIGHT BEARING RESTRICTIONS: No  FALLS:  Has patient fallen in last 6 months? No  LIVING ENVIRONMENT: Lives with: lives alone Lives in: House/apartment Stairs: No Has following equipment at home: Environmental Consultant - 2 wheeled, Wheelchair (manual), shower chair, bed side commode, Grab bars, and Ramped entry care giver 11 hours a day  OCCUPATION: retired  PLOF: Needs assistance with homemaking, Needs assistance with transfers, and Leisure: plays bridge, she has 11 hours of an aide at home that helps with meals, going to MD's, dressing and bathing, is alone at night  PATIENT GOALS: walk, have less pain, transfer without difficulty  NEXT MD VISIT: none scheduled  OBJECTIVE:   DIAGNOSTIC FINDINGS: none performed  COGNITION: Overall cognitive status: Within functional limits for tasks assessed     SENSATION: WFL POSTURE: rounded shoulders and forward head  PALPATION: Left knee and ankle are swollen and tender, left upper and neck  LOWER EXTREMITY ROM:  Active ROM Left eval Left 05/15/24 Left 07/15/24 Left 07/22/24 07/31/24 Left  Hip flexion       Hip extension       Hip abduction       Hip adduction       Hip internal rotation       Hip external rotation       Knee flexion 90 91 93 95 95  Knee  extension 0 0 0 0 0  Ankle dorsiflexion 5      Ankle plantarflexion 30      Ankle inversion 5      Ankle eversion 0       (Blank rows = not tested)  LOWER EXTREMITY MMT:  MMT Right eval Left eval Right 07/15/24 Left 07/15/24 Left 07/22/24 Left 07/31/24  Hip flexion 3+ 3+ 4- 4- 4 4  Hip extension        Hip abduction 4- 3+ 4 4    Hip adduction 4- 3+      Hip internal rotation        Hip external rotation        Knee flexion 4- 3+ 4 4- 4 4  Knee extension 4- 3+ 4 4 4 5   Ankle dorsiflexion  3+  4 4 5   Ankle plantarflexion  Ankle inversion        Ankle eversion         (Blank rows = not tested)  FUNCTIONAL TESTS:  Transfers Max A, left knee gives out and goes into ER and tends to bow into varus, has not walked since I discharged her 12/27/23.    GAIT: Distance walked: with HHA and w/c follow x 40 feet with pain in the left knee a 7/10, she tends to rotate the left hip posterior and really bring the left knee into extension   TODAY'S TREATMENT:                                                                                                                              DATE:  08/12/24 STM to the left upper trap and into the neck and rhomboid Approximation L digits Nustep level 5 x 9 minutes 3# LAQ 3# marches Blue tband ankle PF/DF Gait with HHA and W/C follow x50 feet and then x 60 feet, the left knee was buckling a little more today  08/07/24 STM to the left upper trap and into the neck and rhomboid Nustep level 5 x 9 minutes 3# LAQ 3# marches Blue tband hip abduction Blue tband ankle PF/DF Gait with HHA and W/C follow x100 feet and then x 50 feet, the left knee was buckling a little more today  08/05/24 STM to the left upper trap area Nustep level 5 x 8 minutes Seated partial sit ups Seated elbow touches to side 5# LAQ 5# marches Blue tband clamshells Gait with HHA and w/c follow 120 feet then a rest and 60 feet  07/31/24 NuStep L 5 x 9 min STM to UT and  cervical spine Approximation t0 L digits  Goals Red tband hip adduction, abduction LLE only  LAQ RLE 5lb 2x15 HS curls RLE red 2x15 Gait HHA and w/c follow 100 feet  07/29/24 Nustep level 5 x 8 minutes HS curls red 2x15 LAQ 5lb 2x15 Seated March 5lb 2x10  L ankle PR/DF red 2x10 Red tband hip adduction, abduction LLE only  Gait HHA and w/c follow 60 feet, 12ft, RLE externally rotated  07/22/24 Nustep level 5 x 8 minutes 5# LAQ and marches Red tband HS curl Red tband hip adduction, abduction Gait HHA and w/c follow 60 feet, RLE externally rotated  07/17/24 Nustep level 5 x 8 minutes STM to the left upper trap and neck area Gait 80 feet with HHA and w/c follow Red tband hip adduction, abduction Red tband HS curl Red tband ankle PF/DF 5# LAQ and marches Gait HHA and w/c follow 70 feet,   07/15/24 STM to UT and cervical spine L digit approximation NuStep L 5 x 8 min Goals  LAQ 3lb 2x10 Seated March 3lb 2x10 Hs curls green 2x10 STM to the left upper trap and neck Gait with HHA and w/c follow x 100 feet one seated rest   07/10/24 Nustep level 5 x  9 minutes Standing weight shifts Standing left leg marches 3# marches and LAQ seated Red tband HS curls Red tband left hip only adduction and abduction Red tband left ankle motions STM to the left upper trap and neck Educated caregiver (new today) on transfers and transfer set up and safety Gait with HHA and w/c follow x 100 feet  07/08/24 NuStep L 5 x 8 min 500 steps STM to UT and cervical spine L digit approximation 5# LAQ 2x10 5# hip flexion seated 2 sets 10 HS curls red 2x10 STS with min A 5 x Gait with min HHA and W/C follow 139ft w/ one seated rest  07/03/24 STM to UT and cervical spine L digit approximation NuStep L 5 x 7 min 500 steps 5# LAQ 2x10 HS curls red 2x10 LLE ankle PF/DF green 2x10 Gait with HHA and W/C follow 19ft w/ one seated rest  06/30/24 NuStep L5x  3# marches  3# LAQ  2x15 Red band HS curls 2x10 STM to upper trap and neck Gait with HHA and W/C follow 2x75ft  06/24/24 Nustep level 5 x 8 minutes Seated partial sit ups Seated elbow touches each side 2# marches unsupported back 2# LAQ unsupported back 2 toe touches with HHA STM to the left upper trap and neck Unsupported sitting ball bounce Gait with HHA and w/c follow 2x90 feet   PATIENT EDUCATION:  Education details: POC Person educated: Patient Education method: Explanation Education comprehension: verbalized understanding  HOME EXERCISE PROGRAM: 06/10/24  Marches, LAQ, ankle PF/DF, left hip adduction and abduction  ASSESSMENT:  CLINICAL IMPRESSION: Patient is a 82 y.o. female who was seen today for physical therapy treatment for debility,  she has not been able to walk or transfer or exercise much since D/C from PT in May.  Session completed at mat able to promote moe core stability. Cue not to lean back needed with LAQ. Cues for full ROM needed with HS curls. Some fatigue noted with gait requiring seated rest   OBJECTIVE IMPAIRMENTS: Abnormal gait, cardiopulmonary status limiting activity, decreased activity tolerance, decreased balance, decreased coordination, decreased endurance, decreased mobility, difficulty walking, decreased ROM, decreased strength, increased edema, impaired flexibility, improper body mechanics, postural dysfunction, and pain.   REHAB POTENTIAL: Good  CLINICAL DECISION MAKING: Stable/uncomplicated  EVALUATION COMPLEXITY: Low   GOALS: Goals reviewed with patient? Yes  SHORT TERM GOALS: Target date: 05/28/24 Independent with advanced HEP Goal status: progressing as she has one caregive that can do this 05/20/24  LONG TERM GOALS: Target date: 08/04/24  Independent with advanced HEP with caregiver Goal status: progressing 08/05/24  2.  Transfer with set up and CGA Goal status: will probably not meet this goal she needs Min A 07/15/24, Ongoing 07/31/24  3.   Walk HHA x 150 feet Goal status: Progressing 07/17/24 able to do 80 and 70 feet, cues needed for foot placement, Progressing 08/05/24  4.  Increase left LE strength to 4/5 Goal status: progressing 07/10/24, Met 07/31/24  5.  Report neck and shoulder pain decreased 50% Goal status: progressing 07/10/24, Progressing 20% 07/15/24  08/05/24  PLAN:  PT FREQUENCY: 2x/week  PT DURATION: 12 weeks  PLANNED INTERVENTIONS: Therapeutic exercises, Therapeutic activity, Neuromuscular re-education, Balance training, Gait training, Patient/Family education, Self Care, Joint mobilization, Moist heat, Taping, and Manual therapy  PLAN FOR NEXT SESSION: advance activities really work on stability of the left knee, hip adduction. Will ask for more visits on this renewal and decrease to 1x/week   Tanda KANDICE Sorrow, PTA  08/12/2024, 11:09 AM

## 2024-08-14 ENCOUNTER — Encounter: Payer: Self-pay | Admitting: Physical Therapy

## 2024-08-14 ENCOUNTER — Ambulatory Visit: Admitting: Physical Therapy

## 2024-08-14 DIAGNOSIS — M6281 Muscle weakness (generalized): Secondary | ICD-10-CM | POA: Diagnosis not present

## 2024-08-14 DIAGNOSIS — R262 Difficulty in walking, not elsewhere classified: Secondary | ICD-10-CM

## 2024-08-14 DIAGNOSIS — R2681 Unsteadiness on feet: Secondary | ICD-10-CM

## 2024-08-14 DIAGNOSIS — I69354 Hemiplegia and hemiparesis following cerebral infarction affecting left non-dominant side: Secondary | ICD-10-CM

## 2024-08-14 NOTE — Therapy (Signed)
 OUTPATIENT PHYSICAL THERAPY LOWER EXTREMITY TREATMENT     Patient Name: Karen Jackson MRN: [id number] DOB:1941-07-06, 83 y.o., female Today's Date: 08/14/2024  END OF SESSION:  PT End of Session - 08/14/24 1101     Visit Number 28    Date for Recertification  09/05/24    PT Start Time 1100    PT Stop Time 1145    PT Time Calculation (min) 45 min    Activity Tolerance Patient tolerated treatment well    Behavior During Therapy [facility] for tasks assessed/performed            Past Medical History:  Diagnosis Date   Acute cystitis without hematuria    Acute diastolic CHF (congestive heart failure) (HCC)    Arthritis    Dyspnea    Dysrhythmia    Fever of unknown origin 03/19/2017   Hyperlipidemia    Hypertension    denies at preop   Multifocal pneumonia    Neuromuscular disorder (HCC)    neuropathy left arm and foot   Osteopenia    Paralysis (HCC)    partial left side from CVA    Persistent atrial fibrillation (HCC)    PONV (postoperative nausea and vomiting)    Pre-diabetes    Stroke [facility]) 2013   hemmorahgic   Past Surgical History:  Procedure Laterality Date   ANKLE SURGERY     APPENDECTOMY     CHOLECYSTECTOMY     HARDWARE REMOVAL Left 03/29/2023   Procedure: HARDWARE REMOVAL;  Surgeon: Kit Rush, MD;  Location: [facility];  Service: Orthopedics;  Laterality: Left;   HERNIA REPAIR     Esophagus   INCISION AND DRAINAGE OF WOUND Left 03/29/2023   Procedure: LEFT ANKLE WOUND IRRIGATION AND DEBRIDEMENT WOUND;  Surgeon: Kit Rush, MD;  Location: [facility];  Service: Orthopedics;  Laterality: Left;   JOINT REPLACEMENT     total- right partial- left   MASTECTOMY PARTIAL / LUMPECTOMY  2012   left   ORIF ANKLE FRACTURE Left 07/20/2018   Procedure: OPEN REDUCTION INTERNAL FIXATION (ORIF) ANKLE FRACTURE;  Surgeon: Kit Rush, MD;  Location: [facility] OR;  Service: Orthopedics;  Laterality: Left;   TOTAL KNEE ARTHROPLASTY Left 01/27/2019    Procedure: TOTAL KNEE ARTHROPLASTY;  Surgeon: Rubie Kemps, MD;  Location: [facility];  Service: Orthopedics;  Laterality: Left;   Patient Active Problem List   Diagnosis Date Noted   Senile osteoporosis 06/02/2024   Goals of care, counseling/discussion 01/17/2022   Grief 01/17/2022   Secondary hypercoagulable state 03/16/2021   Dizziness 03/10/2021   Orthostatic hypotension 10/15/2020   Presbycusis of both ears 03/08/2020   Mixed stress and urge urinary incontinence 12/02/2019   Macrocytosis 12/01/2019   Nutritional anemia 12/01/2019   S/P total knee replacement 01/27/2019   Recurrent left knee instability 07/05/2018   Respiratory failure with hypoxia (HCC) 08/30/2017   Hypoxemia    Heart failure with preserved ejection fraction (HCC), Grade 3 diastolic dysfunction 03/26/2017   PAF (paroxysmal atrial fibrillation) (HCC)    Dyspnea 03/19/2017   Encounter for preventive health examination 02/17/2016   Sensorineural hearing loss (SNHL), bilateral 01/26/2016   Hypomagnesemia 04/24/2014   Hemiparesis affecting left side as late effect of cerebrovascular accident (HCC) 04/24/2014   Nontraumatic cerebral hemorrhage (HCC) 04/30/2012   DM (diabetes mellitus) with complications (HCC) 03/04/2010   OSTEOPENIA 01/21/2009   UNSPECIFIED VITAMIN D  DEFICIENCY 11/19/2007   HYPERCHOLESTEROLEMIA 10/25/2006   GASTROESOPHAGEAL REFLUX, NO ESOPHAGITIS 10/25/2006   DIVERTICULOSIS OF COLON 10/25/2006  Osteoarthritis 10/25/2006   CERVICAL SPINE DISORDER, NOS 10/25/2006    PCP: Shayne, MD  REFERRING PROVIDER: Shayne, MD  REFERRING DIAG: debility  THERAPY DIAG:  Muscle weakness (generalized)  Difficulty in walking, not elsewhere classified  Unsteadiness on feet  Hemiplegia and hemiparesis following cerebral infarction affecting left non-dominant side [facility])  Rationale for Evaluation and Treatment: Rehabilitation  ONSET DATE: 04/29/24  SUBJECTIVE:   SUBJECTIVE STATEMENT: Shoulder and knees are  sore  Patient has been a patient of mine off and on for many years, she has had many different diagnoses, she had a CVA about 13 years ago, she had a left ankle fracture with ORIF, she had a lot of issues with pain and wounds, so the hardware was removed last August, she has done pretty well but tends to back slide without PT with her ability to walk and transfer.  She has a caregiver about 11 hours a day.  She has been able to walk with me in PT, no one else has walked with her   PERTINENT HISTORY: CVA, CHF, HTN, A-fib, TKA PAIN:  Are you having pain? Yes: NPRS scale: 5/10 Pain location: knee Pain description: sore Aggravating factors: neck and shoulder always hurt, knee and ankle hurt with transfers Relieving factors: massage, heat  PRECAUTIONS: None  RED FLAGS: None   WEIGHT BEARING RESTRICTIONS: No  FALLS:  Has patient fallen in last 6 months? No  LIVING ENVIRONMENT: Lives with: lives alone Lives in: House/apartment Stairs: No Has following equipment at home: Environmental Consultant - 2 wheeled, Wheelchair (manual), shower chair, bed side commode, Grab bars, and Ramped entry care giver 11 hours a day  OCCUPATION: retired  PLOF: Needs assistance with homemaking, Needs assistance with transfers, and Leisure: plays bridge, she has 11 hours of an aide at home that helps with meals, going to MD's, dressing and bathing, is alone at night  PATIENT GOALS: walk, have less pain, transfer without difficulty  NEXT MD VISIT: none scheduled  OBJECTIVE:   DIAGNOSTIC FINDINGS: none performed  COGNITION: Overall cognitive status: Within functional limits for tasks assessed     SENSATION: WFL POSTURE: rounded shoulders and forward head  PALPATION: Left knee and ankle are swollen and tender, left upper and neck  LOWER EXTREMITY ROM:  Active ROM Left eval Left 05/15/24 Left 07/15/24 Left 07/22/24 07/31/24 Left  Hip flexion       Hip extension       Hip abduction       Hip adduction       Hip  internal rotation       Hip external rotation       Knee flexion 90 91 93 95 95  Knee extension 0 0 0 0 0  Ankle dorsiflexion 5      Ankle plantarflexion 30      Ankle inversion 5      Ankle eversion 0       (Blank rows = not tested)  LOWER EXTREMITY MMT:  MMT Right eval Left eval Right 07/15/24 Left 07/15/24 Left 07/22/24 Left 07/31/24  Hip flexion 3+ 3+ 4- 4- 4 4  Hip extension        Hip abduction 4- 3+ 4 4    Hip adduction 4- 3+      Hip internal rotation        Hip external rotation        Knee flexion 4- 3+ 4 4- 4 4  Knee extension 4- 3+ 4 4 4 5   Ankle  dorsiflexion  3+  4 4 5   Ankle plantarflexion        Ankle inversion        Ankle eversion         (Blank rows = not tested)  FUNCTIONAL TESTS:  Transfers Max A, left knee gives out and goes into ER and tends to bow into varus, has not walked since I discharged her 12/27/23.    GAIT: Distance walked: with HHA and w/c follow x 40 feet with pain in the left knee a 7/10, she tends to rotate the left hip posterior and really bring the left knee into extension   TODAY'S TREATMENT:                                                                                                                              DATE:  08/14/24 Approximation L digits STM to the left upper trap and into the neck and rhomboid Nustep level 5 x 7:42 minutes 500 steps Blue tband ankle PF/DF LLE HS Curls red 2x15 LAQ 3lb 2x15 Hip add ball squeeze Gait with HHA and W/C follow x 19feet    08/12/24 STM to the left upper trap and into the neck and rhomboid Approximation L digits Nustep level 5 x 9 minutes 3# LAQ 3# marches Blue tband ankle PF/DF Gait with HHA and W/C follow x50 feet and then x 60 feet, the left knee was buckling a little more today  08/07/24 STM to the left upper trap and into the neck and rhomboid Nustep level 5 x 9 minutes 3# LAQ 3# marches Blue tband hip abduction Blue tband ankle PF/DF Gait with HHA and W/C follow x100  feet and then x 50 feet, the left knee was buckling a little more today  08/05/24 STM to the left upper trap area Nustep level 5 x 8 minutes Seated partial sit ups Seated elbow touches to side 5# LAQ 5# marches Blue tband clamshells Gait with HHA and w/c follow 120 feet then a rest and 60 feet  07/31/24 NuStep L 5 x 9 min STM to UT and cervical spine Approximation t0 L digits  Goals Red tband hip adduction, abduction LLE only  LAQ RLE 5lb 2x15 HS curls RLE red 2x15 Gait HHA and w/c follow 100 feet  07/29/24 Nustep level 5 x 8 minutes HS curls red 2x15 LAQ 5lb 2x15 Seated March 5lb 2x10  L ankle PR/DF red 2x10 Red tband hip adduction, abduction LLE only  Gait HHA and w/c follow 60 feet, 9ft, RLE externally rotated  07/22/24 Nustep level 5 x 8 minutes 5# LAQ and marches Red tband HS curl Red tband hip adduction, abduction Gait HHA and w/c follow 60 feet, RLE externally rotated  07/17/24 Nustep level 5 x 8 minutes STM to the left upper trap and neck area Gait 80 feet with HHA and w/c follow Red tband hip adduction, abduction Red tband HS curl Red tband ankle PF/DF 5#  LAQ and marches Gait HHA and w/c follow 70 feet,   07/15/24 STM to UT and cervical spine L digit approximation NuStep L 5 x 8 min Goals  LAQ 3lb 2x10 Seated March 3lb 2x10 Hs curls green 2x10 STM to the left upper trap and neck Gait with HHA and w/c follow x 100 feet one seated rest   07/10/24 Nustep level 5 x 9 minutes Standing weight shifts Standing left leg marches 3# marches and LAQ seated Red tband HS curls Red tband left hip only adduction and abduction Red tband left ankle motions STM to the left upper trap and neck Educated caregiver (new today) on transfers and transfer set up and safety Gait with HHA and w/c follow x 100 feet  07/08/24 NuStep L 5 x 8 min 500 steps STM to UT and cervical spine L digit approximation 5# LAQ 2x10 5# hip flexion seated 2 sets 10 HS curls red  2x10 STS with min A 5 x Gait with min HHA and W/C follow 128ft w/ one seated rest  07/03/24 STM to UT and cervical spine L digit approximation NuStep L 5 x 7 min 500 steps 5# LAQ 2x10 HS curls red 2x10 LLE ankle PF/DF green 2x10 Gait with HHA and W/C follow 159ft w/ one seated rest  06/30/24 NuStep L5x  3# marches  3# LAQ 2x15 Red band HS curls 2x10 STM to upper trap and neck Gait with HHA and W/C follow 2x76ft  06/24/24 Nustep level 5 x 8 minutes Seated partial sit ups Seated elbow touches each side 2# marches unsupported back 2# LAQ unsupported back 2 toe touches with HHA STM to the left upper trap and neck Unsupported sitting ball bounce Gait with HHA and w/c follow 2x90 feet   PATIENT EDUCATION:  Education details: POC Person educated: Patient Education method: Explanation Education comprehension: verbalized understanding  HOME EXERCISE PROGRAM: 06/10/24  Marches, LAQ, ankle PF/DF, left hip adduction and abduction  ASSESSMENT:  CLINICAL IMPRESSION: Patient is a 83 y.o. female who was seen today for physical therapy treatment for debility,  she has not been able to walk or transfer or exercise much since D/C from PT in May.  Again session completed at mat table to promote moe core stability. Cue not to lean back needed with LAQ, as well as tactile cue to prevent hip flexion.  Cues for full ROM needed with HS curls. Some fatigue noted with gait but able to increase distance.   OBJECTIVE IMPAIRMENTS: Abnormal gait, cardiopulmonary status limiting activity, decreased activity tolerance, decreased balance, decreased coordination, decreased endurance, decreased mobility, difficulty walking, decreased ROM, decreased strength, increased edema, impaired flexibility, improper body mechanics, postural dysfunction, and pain.   REHAB POTENTIAL: Good  CLINICAL DECISION MAKING: Stable/uncomplicated  EVALUATION COMPLEXITY: Low   GOALS: Goals reviewed with patient?  Yes  SHORT TERM GOALS: Target date: 05/28/24 Independent with advanced HEP Goal status: progressing as she has one caregive that can do this 05/20/24  LONG TERM GOALS: Target date: 08/04/24  Independent with advanced HEP with caregiver Goal status: progressing 08/05/24  2.  Transfer with set up and CGA Goal status: will probably not meet this goal she needs Min A 07/15/24, Ongoing 07/31/24  3.  Walk HHA x 150 feet Goal status: Progressing 07/17/24 able to do 80 and 70 feet, cues needed for foot placement, Progressing 08/05/24  4.  Increase left LE strength to 4/5 Goal status: progressing 07/10/24, Met 07/31/24  5.  Report neck and shoulder pain decreased  50% Goal status: progressing 07/10/24, Progressing 20% 07/15/24  08/05/24  PLAN:  PT FREQUENCY: 2x/week  PT DURATION: 12 weeks  PLANNED INTERVENTIONS: Therapeutic exercises, Therapeutic activity, Neuromuscular re-education, Balance training, Gait training, Patient/Family education, Self Care, Joint mobilization, Moist heat, Taping, and Manual therapy  PLAN FOR NEXT SESSION: advance activities really work on stability of the left knee, hip adduction. Will ask for more visits on this renewal and decrease to 1x/week   Tanda KANDICE Sorrow, PTA 08/14/2024, 11:02 AM

## 2024-08-19 ENCOUNTER — Ambulatory Visit: Admitting: Physical Therapy

## 2024-08-19 ENCOUNTER — Encounter: Payer: Self-pay | Admitting: Physical Therapy

## 2024-08-19 DIAGNOSIS — M6281 Muscle weakness (generalized): Secondary | ICD-10-CM | POA: Diagnosis not present

## 2024-08-19 DIAGNOSIS — R2681 Unsteadiness on feet: Secondary | ICD-10-CM

## 2024-08-19 DIAGNOSIS — R262 Difficulty in walking, not elsewhere classified: Secondary | ICD-10-CM

## 2024-08-19 NOTE — Therapy (Signed)
 " OUTPATIENT PHYSICAL THERAPY LOWER EXTREMITY TREATMENT     Patient Name: Karen Jackson MRN: [id number] DOB:1940/11/20, 83 y.o., female Today's Date: 08/19/2024  END OF SESSION:  PT End of Session - 08/19/24 1101     Visit Number 29    Date for Recertification  09/05/24    PT Start Time 1100    PT Stop Time 1145    PT Time Calculation (min) 45 min    Activity Tolerance Patient tolerated treatment well    Behavior During Therapy [facility] for tasks assessed/performed            Past Medical History:  Diagnosis Date   Acute cystitis without hematuria    Acute diastolic CHF (congestive heart failure) (HCC)    Arthritis    Dyspnea    Dysrhythmia    Fever of unknown origin 03/19/2017   Hyperlipidemia    Hypertension    denies at preop   Multifocal pneumonia    Neuromuscular disorder (HCC)    neuropathy left arm and foot   Osteopenia    Paralysis (HCC)    partial left side from CVA    Persistent atrial fibrillation (HCC)    PONV (postoperative nausea and vomiting)    Pre-diabetes    Stroke [facility]) 2013   hemmorahgic   Past Surgical History:  Procedure Laterality Date   ANKLE SURGERY     APPENDECTOMY     CHOLECYSTECTOMY     HARDWARE REMOVAL Left 03/29/2023   Procedure: HARDWARE REMOVAL;  Surgeon: Kit Rush, MD;  Location: [facility];  Service: Orthopedics;  Laterality: Left;   HERNIA REPAIR     Esophagus   INCISION AND DRAINAGE OF WOUND Left 03/29/2023   Procedure: LEFT ANKLE WOUND IRRIGATION AND DEBRIDEMENT WOUND;  Surgeon: Kit Rush, MD;  Location: [facility];  Service: Orthopedics;  Laterality: Left;   JOINT REPLACEMENT     total- right partial- left   MASTECTOMY PARTIAL / LUMPECTOMY  2012   left   ORIF ANKLE FRACTURE Left 07/20/2018   Procedure: OPEN REDUCTION INTERNAL FIXATION (ORIF) ANKLE FRACTURE;  Surgeon: Kit Rush, MD;  Location: [facility];  Service: Orthopedics;  Laterality: Left;   TOTAL KNEE ARTHROPLASTY Left 01/27/2019    Procedure: TOTAL KNEE ARTHROPLASTY;  Surgeon: Rubie Kemps, MD;  Location: [facility];  Service: Orthopedics;  Laterality: Left;   Patient Active Problem List   Diagnosis Date Noted   Senile osteoporosis 06/02/2024   Goals of care, counseling/discussion 01/17/2022   Grief 01/17/2022   Secondary hypercoagulable state 03/16/2021   Dizziness 03/10/2021   Orthostatic hypotension 10/15/2020   Presbycusis of both ears 03/08/2020   Mixed stress and urge urinary incontinence 12/02/2019   Macrocytosis 12/01/2019   Nutritional anemia 12/01/2019   S/P total knee replacement 01/27/2019   Recurrent left knee instability 07/05/2018   Respiratory failure with hypoxia (HCC) 08/30/2017   Hypoxemia    Heart failure with preserved ejection fraction (HCC), Grade 3 diastolic dysfunction 03/26/2017   PAF (paroxysmal atrial fibrillation) (HCC)    Dyspnea 03/19/2017   Encounter for preventive health examination 02/17/2016   Sensorineural hearing loss (SNHL), bilateral 01/26/2016   Hypomagnesemia 04/24/2014   Hemiparesis affecting left side as late effect of cerebrovascular accident (HCC) 04/24/2014   Nontraumatic cerebral hemorrhage (HCC) 04/30/2012   DM (diabetes mellitus) with complications (HCC) 03/04/2010   OSTEOPENIA 01/21/2009   UNSPECIFIED VITAMIN D  DEFICIENCY 11/19/2007   HYPERCHOLESTEROLEMIA 10/25/2006   GASTROESOPHAGEAL REFLUX, NO ESOPHAGITIS 10/25/2006   DIVERTICULOSIS OF COLON  10/25/2006   Osteoarthritis 10/25/2006   CERVICAL SPINE DISORDER, NOS 10/25/2006    PCP: Shayne, MD  REFERRING PROVIDER: Shayne, MD  REFERRING DIAG: debility  THERAPY DIAG:  Muscle weakness (generalized)  Difficulty in walking, not elsewhere classified  Unsteadiness on feet  Rationale for Evaluation and Treatment: Rehabilitation  ONSET DATE: 04/29/24  SUBJECTIVE:   SUBJECTIVE STATEMENT: L ankle, L knee, and both shoulders are sore  Patient has been a patient of mine off and on for many years, she has had  many different diagnoses, she had a CVA about 13 years ago, she had a left ankle fracture with ORIF, she had a lot of issues with pain and wounds, so the hardware was removed last August, she has done pretty well but tends to back slide without PT with her ability to walk and transfer.  She has a caregiver about 11 hours a day.  She has been able to walk with me in PT, no one else has walked with her   PERTINENT HISTORY: CVA, CHF, HTN, A-fib, TKA PAIN:  Are you having pain? Yes: NPRS scale: 5/10 Pain location: knee Pain description: sore Aggravating factors: neck and shoulder always hurt, knee and ankle hurt with transfers Relieving factors: massage, heat  PRECAUTIONS: None  RED FLAGS: None   WEIGHT BEARING RESTRICTIONS: No  FALLS:  Has patient fallen in last 6 months? No  LIVING ENVIRONMENT: Lives with: lives alone Lives in: House/apartment Stairs: No Has following equipment at home: Environmental Consultant - 2 wheeled, Wheelchair (manual), shower chair, bed side commode, Grab bars, and Ramped entry care giver 11 hours a day  OCCUPATION: retired  PLOF: Needs assistance with homemaking, Needs assistance with transfers, and Leisure: plays bridge, she has 11 hours of an aide at home that helps with meals, going to MD's, dressing and bathing, is alone at night  PATIENT GOALS: walk, have less pain, transfer without difficulty  NEXT MD VISIT: none scheduled  OBJECTIVE:   DIAGNOSTIC FINDINGS: none performed  COGNITION: Overall cognitive status: Within functional limits for tasks assessed     SENSATION: WFL POSTURE: rounded shoulders and forward head  PALPATION: Left knee and ankle are swollen and tender, left upper and neck  LOWER EXTREMITY ROM:  Active ROM Left eval Left 05/15/24 Left 07/15/24 Left 07/22/24 07/31/24 Left  Hip flexion       Hip extension       Hip abduction       Hip adduction       Hip internal rotation       Hip external rotation       Knee flexion 90 91 93 95 95   Knee extension 0 0 0 0 0  Ankle dorsiflexion 5      Ankle plantarflexion 30      Ankle inversion 5      Ankle eversion 0       (Blank rows = not tested)  LOWER EXTREMITY MMT:  MMT Right eval Left eval Right 07/15/24 Left 07/15/24 Left 07/22/24 Left 07/31/24  Hip flexion 3+ 3+ 4- 4- 4 4  Hip extension        Hip abduction 4- 3+ 4 4    Hip adduction 4- 3+      Hip internal rotation        Hip external rotation        Knee flexion 4- 3+ 4 4- 4 4  Knee extension 4- 3+ 4 4 4 5   Ankle dorsiflexion  3+  4  4 5  Ankle plantarflexion        Ankle inversion        Ankle eversion         (Blank rows = not tested)  FUNCTIONAL TESTS:  Transfers Max A, left knee gives out and goes into ER and tends to bow into varus, has not walked since I discharged her 12/27/23.    GAIT: Distance walked: with HHA and w/c follow x 40 feet with pain in the left knee a 7/10, she tends to rotate the left hip posterior and really bring the left knee into extension   TODAY'S TREATMENT:                                                                                                                              DATE:  08/19/24 NuStep L 5 x 9 min 500 step s green tband ankle PF/DF LLE  HS curls red 2x10  LAQ 3lb 2x15 Hip add ball squeeze Hip abduction green 2x15 Approximation L digits STM to the left upper trap and into the neck and rhomboid Gait with HHA and w/c follow 100 feet then a rest and 40 feet  08/14/24 Approximation L digits STM to the left upper trap and into the neck and rhomboid Nustep level 5 x 7:42 minutes 500 steps Blue tband ankle PF/DF LLE HS Curls red 2x15 LAQ 3lb 2x15 Hip add ball squeeze Gait with HHA and W/C follow x 75feet    08/12/24 STM to the left upper trap and into the neck and rhomboid Approximation L digits Nustep level 5 x 9 minutes 3# LAQ 3# marches Blue tband ankle PF/DF Gait with HHA and W/C follow x50 feet and then x 60 feet, the left knee was buckling a  little more today  08/07/24 STM to the left upper trap and into the neck and rhomboid Nustep level 5 x 9 minutes 3# LAQ 3# marches Blue tband hip abduction Blue tband ankle PF/DF Gait with HHA and W/C follow x100 feet and then x 50 feet, the left knee was buckling a little more today  08/05/24 STM to the left upper trap area Nustep level 5 x 8 minutes Seated partial sit ups Seated elbow touches to side 5# LAQ 5# marches Blue tband clamshells Gait with HHA and w/c follow 120 feet then a rest and 60 feet  07/31/24 NuStep L 5 x 9 min STM to UT and cervical spine Approximation t0 L digits  Goals Red tband hip adduction, abduction LLE only  LAQ RLE 5lb 2x15 HS curls RLE red 2x15 Gait HHA and w/c follow 100 feet  07/29/24 Nustep level 5 x 8 minutes HS curls red 2x15 LAQ 5lb 2x15 Seated March 5lb 2x10  L ankle PR/DF red 2x10 Red tband hip adduction, abduction LLE only  Gait HHA and w/c follow 60 feet, 71ft, RLE externally rotated  07/22/24 Nustep level 5 x 8 minutes 5# LAQ and marches Red  tband HS curl Red tband hip adduction, abduction Gait HHA and w/c follow 60 feet, RLE externally rotated  07/17/24 Nustep level 5 x 8 minutes STM to the left upper trap and neck area Gait 80 feet with HHA and w/c follow Red tband hip adduction, abduction Red tband HS curl Red tband ankle PF/DF 5# LAQ and marches Gait HHA and w/c follow 70 feet,   07/15/24 STM to UT and cervical spine L digit approximation NuStep L 5 x 8 min Goals  LAQ 3lb 2x10 Seated March 3lb 2x10 Hs curls green 2x10 STM to the left upper trap and neck Gait with HHA and w/c follow x 100 feet one seated rest   07/10/24 Nustep level 5 x 9 minutes Standing weight shifts Standing left leg marches 3# marches and LAQ seated Red tband HS curls Red tband left hip only adduction and abduction Red tband left ankle motions STM to the left upper trap and neck Educated caregiver (new today) on transfers and  transfer set up and safety Gait with HHA and w/c follow x 100 feet  07/08/24 NuStep L 5 x 8 min 500 steps STM to UT and cervical spine L digit approximation 5# LAQ 2x10 5# hip flexion seated 2 sets 10 HS curls red 2x10 STS with min A 5 x Gait with min HHA and W/C follow 180ft w/ one seated rest  07/03/24 STM to UT and cervical spine L digit approximation NuStep L 5 x 7 min 500 steps 5# LAQ 2x10 HS curls red 2x10 LLE ankle PF/DF green 2x10 Gait with HHA and W/C follow 177ft w/ one seated rest  06/30/24 NuStep L5x  3# marches  3# LAQ 2x15 Red band HS curls 2x10 STM to upper trap and neck Gait with HHA and W/C follow 2x70ft  06/24/24 Nustep level 5 x 8 minutes Seated partial sit ups Seated elbow touches each side 2# marches unsupported back 2# LAQ unsupported back 2 toe touches with HHA STM to the left upper trap and neck Unsupported sitting ball bounce Gait with HHA and w/c follow 2x90 feet   PATIENT EDUCATION:  Education details: POC Person educated: Patient Education method: Explanation Education comprehension: verbalized understanding  HOME EXERCISE PROGRAM: 06/10/24  Marches, LAQ, ankle PF/DF, left hip adduction and abduction  ASSESSMENT:  CLINICAL IMPRESSION: Patient is a 83 y.o. female who was seen today for physical therapy treatment for debility,  she has not been able to walk or transfer or exercise much since D/C from PT in May.  Again session completed at mat table to promote moe core stability. Cue not to lean back needed with LAQ.   Cues for full ROM needed with HS curls. Some fatigue noted with gait but able to increase distance.   OBJECTIVE IMPAIRMENTS: Abnormal gait, cardiopulmonary status limiting activity, decreased activity tolerance, decreased balance, decreased coordination, decreased endurance, decreased mobility, difficulty walking, decreased ROM, decreased strength, increased edema, impaired flexibility, improper body mechanics,  postural dysfunction, and pain.   REHAB POTENTIAL: Good  CLINICAL DECISION MAKING: Stable/uncomplicated  EVALUATION COMPLEXITY: Low   GOALS: Goals reviewed with patient? Yes  SHORT TERM GOALS: Target date: 05/28/24 Independent with advanced HEP Goal status: progressing as she has one caregive that can do this 05/20/24  LONG TERM GOALS: Target date: 08/04/24  Independent with advanced HEP with caregiver Goal status: progressing 08/05/24  2.  Transfer with set up and CGA Goal status: will probably not meet this goal she needs Min A 07/15/24, Ongoing 07/31/24  3.  Walk HHA x 150 feet Goal status: Progressing 07/17/24 able to do 80 and 70 feet, cues needed for foot placement, Progressing 08/05/24  4.  Increase left LE strength to 4/5 Goal status: progressing 07/10/24, Met 07/31/24  5.  Report neck and shoulder pain decreased 50% Goal status: progressing 07/10/24, Progressing 20% 07/15/24  08/05/24  PLAN:  PT FREQUENCY: 2x/week  PT DURATION: 12 weeks  PLANNED INTERVENTIONS: Therapeutic exercises, Therapeutic activity, Neuromuscular re-education, Balance training, Gait training, Patient/Family education, Self Care, Joint mobilization, Moist heat, Taping, and Manual therapy  PLAN FOR NEXT SESSION: advance activities really work on stability of the left knee, hip adduction. Will ask for more visits on this renewal and decrease to 1x/week   Tanda KANDICE Sorrow, PTA 08/19/2024, 11:02 AM  "

## 2024-08-25 ENCOUNTER — Ambulatory Visit: Admitting: Podiatry

## 2024-09-02 ENCOUNTER — Ambulatory Visit: Attending: Internal Medicine | Admitting: Physical Therapy

## 2024-09-02 ENCOUNTER — Encounter: Payer: Self-pay | Admitting: Physical Therapy

## 2024-09-02 DIAGNOSIS — R2681 Unsteadiness on feet: Secondary | ICD-10-CM | POA: Insufficient documentation

## 2024-09-02 DIAGNOSIS — R262 Difficulty in walking, not elsewhere classified: Secondary | ICD-10-CM | POA: Diagnosis present

## 2024-09-02 DIAGNOSIS — R293 Abnormal posture: Secondary | ICD-10-CM | POA: Insufficient documentation

## 2024-09-02 DIAGNOSIS — I69354 Hemiplegia and hemiparesis following cerebral infarction affecting left non-dominant side: Secondary | ICD-10-CM | POA: Diagnosis present

## 2024-09-02 DIAGNOSIS — M6281 Muscle weakness (generalized): Secondary | ICD-10-CM | POA: Insufficient documentation

## 2024-09-02 DIAGNOSIS — M542 Cervicalgia: Secondary | ICD-10-CM | POA: Insufficient documentation

## 2024-09-02 DIAGNOSIS — R270 Ataxia, unspecified: Secondary | ICD-10-CM | POA: Insufficient documentation

## 2024-09-02 DIAGNOSIS — M25512 Pain in left shoulder: Secondary | ICD-10-CM | POA: Diagnosis present

## 2024-09-02 DIAGNOSIS — M25572 Pain in left ankle and joints of left foot: Secondary | ICD-10-CM | POA: Diagnosis present

## 2024-09-02 DIAGNOSIS — I6930 Unspecified sequelae of cerebral infarction: Secondary | ICD-10-CM | POA: Insufficient documentation

## 2024-09-02 NOTE — Therapy (Signed)
 " OUTPATIENT PHYSICAL THERAPY LOWER EXTREMITY TREATMENT Progress Note Reporting Period 07/17/24 to 09/02/24 for visits 21-30  See note below for Objective Data and Assessment of Progress/Goals.        Patient Name: Karen Jackson MRN: [id number] DOB:23-Jul-1941, 84 y.o., female Today's Date: 09/02/2024  END OF SESSION:  PT End of Session - 09/02/24 1111     Visit Number 30    Date for Recertification  09/05/24    Authorization Type Medicare    PT Start Time 1100    PT Stop Time 1145    PT Time Calculation (min) 45 min    Activity Tolerance Patient tolerated treatment well    Behavior During Therapy [facility] for tasks assessed/performed            Past Medical History:  Diagnosis Date   Acute cystitis without hematuria    Acute diastolic CHF (congestive heart failure) (HCC)    Arthritis    Dyspnea    Dysrhythmia    Fever of unknown origin 03/19/2017   Hyperlipidemia    Hypertension    denies at preop   Multifocal pneumonia    Neuromuscular disorder (HCC)    neuropathy left arm and foot   Osteopenia    Paralysis (HCC)    partial left side from CVA    Persistent atrial fibrillation (HCC)    PONV (postoperative nausea and vomiting)    Pre-diabetes    Stroke [facility]) 2013   hemmorahgic   Past Surgical History:  Procedure Laterality Date   ANKLE SURGERY     APPENDECTOMY     CHOLECYSTECTOMY     HARDWARE REMOVAL Left 03/29/2023   Procedure: HARDWARE REMOVAL;  Surgeon: Kit Rush, MD;  Location: [facility];  Service: Orthopedics;  Laterality: Left;   HERNIA REPAIR     Esophagus   INCISION AND DRAINAGE OF WOUND Left 03/29/2023   Procedure: LEFT ANKLE WOUND IRRIGATION AND DEBRIDEMENT WOUND;  Surgeon: Kit Rush, MD;  Location: [facility];  Service: Orthopedics;  Laterality: Left;   JOINT REPLACEMENT     total- right partial- left   MASTECTOMY PARTIAL / LUMPECTOMY  2012   left   ORIF ANKLE FRACTURE Left 07/20/2018   Procedure: OPEN REDUCTION  INTERNAL FIXATION (ORIF) ANKLE FRACTURE;  Surgeon: Kit Rush, MD;  Location: [facility];  Service: Orthopedics;  Laterality: Left;   TOTAL KNEE ARTHROPLASTY Left 01/27/2019   Procedure: TOTAL KNEE ARTHROPLASTY;  Surgeon: Rubie Kemps, MD;  Location: [facility];  Service: Orthopedics;  Laterality: Left;   Patient Active Problem List   Diagnosis Date Noted   Senile osteoporosis 06/02/2024   Goals of care, counseling/discussion 01/17/2022   Grief 01/17/2022   Secondary hypercoagulable state 03/16/2021   Dizziness 03/10/2021   Orthostatic hypotension 10/15/2020   Presbycusis of both ears 03/08/2020   Mixed stress and urge urinary incontinence 12/02/2019   Macrocytosis 12/01/2019   Nutritional anemia 12/01/2019   S/P total knee replacement 01/27/2019   Recurrent left knee instability 07/05/2018   Respiratory failure with hypoxia (HCC) 08/30/2017   Hypoxemia    Heart failure with preserved ejection fraction (HCC), Grade 3 diastolic dysfunction 03/26/2017   PAF (paroxysmal atrial fibrillation) (HCC)    Dyspnea 03/19/2017   Encounter for preventive health examination 02/17/2016   Sensorineural hearing loss (SNHL), bilateral 01/26/2016   Hypomagnesemia 04/24/2014   Hemiparesis affecting left side as late effect of cerebrovascular accident (HCC) 04/24/2014   Nontraumatic cerebral hemorrhage (HCC) 04/30/2012   DM (diabetes mellitus) with  complications (HCC) 03/04/2010   OSTEOPENIA 01/21/2009   UNSPECIFIED VITAMIN D  DEFICIENCY 11/19/2007   HYPERCHOLESTEROLEMIA 10/25/2006   GASTROESOPHAGEAL REFLUX, NO ESOPHAGITIS 10/25/2006   DIVERTICULOSIS OF COLON 10/25/2006   Osteoarthritis 10/25/2006   CERVICAL SPINE DISORDER, NOS 10/25/2006    PCP: Shayne, MD  REFERRING PROVIDER: Shayne, MD  REFERRING DIAG: debility  THERAPY DIAG:  Muscle weakness (generalized)  Difficulty in walking, not elsewhere classified  Unsteadiness on feet  Hemiplegia and hemiparesis following cerebral infarction affecting  left non-dominant side (HCC)  Left shoulder pain, unspecified chronicity  Late effects of CVA (cerebrovascular accident)  Ataxia  Cervicalgia  Rationale for Evaluation and Treatment: Rehabilitation  ONSET DATE: 04/29/24  SUBJECTIVE:   SUBJECTIVE STATEMENT: Patient reports that he is a little light headed, reports that she did just take albuterol   Patient has been a patient of mine off and on for many years, she has had many different diagnoses, she had a CVA about 13 years ago, she had a left ankle fracture with ORIF, she had a lot of issues with pain and wounds, so the hardware was removed last August, she has done pretty well but tends to back slide without PT with her ability to walk and transfer.  She has a caregiver about 11 hours a day.  She has been able to walk with me in PT, no one else has walked with her   PERTINENT HISTORY: CVA, CHF, HTN, A-fib, TKA PAIN:  Are you having pain? Yes: NPRS scale: 5/10 Pain location: knee Pain description: sore Aggravating factors: neck and shoulder always hurt, knee and ankle hurt with transfers Relieving factors: massage, heat  PRECAUTIONS: None  RED FLAGS: None   WEIGHT BEARING RESTRICTIONS: No  FALLS:  Has patient fallen in last 6 months? No  LIVING ENVIRONMENT: Lives with: lives alone Lives in: House/apartment Stairs: No Has following equipment at home: Environmental Consultant - 2 wheeled, Wheelchair (manual), shower chair, bed side commode, Grab bars, and Ramped entry care giver 11 hours a day  OCCUPATION: retired  PLOF: Needs assistance with homemaking, Needs assistance with transfers, and Leisure: plays bridge, she has 11 hours of an aide at home that helps with meals, going to MD's, dressing and bathing, is alone at night  PATIENT GOALS: walk, have less pain, transfer without difficulty  NEXT MD VISIT: none scheduled  OBJECTIVE:   DIAGNOSTIC FINDINGS: none performed  COGNITION: Overall cognitive status: Within functional  limits for tasks assessed     SENSATION: WFL POSTURE: rounded shoulders and forward head  PALPATION: Left knee and ankle are swollen and tender, left upper and neck  LOWER EXTREMITY ROM:  Active ROM Left eval Left 05/15/24 Left 07/15/24 Left 07/22/24 07/31/24 Left  Hip flexion       Hip extension       Hip abduction       Hip adduction       Hip internal rotation       Hip external rotation       Knee flexion 90 91 93 95 95  Knee extension 0 0 0 0 0  Ankle dorsiflexion 5      Ankle plantarflexion 30      Ankle inversion 5      Ankle eversion 0       (Blank rows = not tested)  LOWER EXTREMITY MMT:  MMT Right eval Left eval Right 07/15/24 Left 07/15/24 Left 07/22/24 Left 07/31/24  Hip flexion 3+ 3+ 4- 4- 4 4  Hip extension  Hip abduction 4- 3+ 4 4    Hip adduction 4- 3+      Hip internal rotation        Hip external rotation        Knee flexion 4- 3+ 4 4- 4 4  Knee extension 4- 3+ 4 4 4 5   Ankle dorsiflexion  3+  4 4 5   Ankle plantarflexion        Ankle inversion        Ankle eversion         (Blank rows = not tested)  FUNCTIONAL TESTS:  Transfers Max A, left knee gives out and goes into ER and tends to bow into varus, has not walked since I discharged her 12/27/23.    GAIT: Distance walked: with HHA and w/c follow x 40 feet with pain in the left knee a 7/10, she tends to rotate the left hip posterior and really bring the left knee into extension   TODAY'S TREATMENT:                                                                                                                              DATE:  09/02/24 BP 125/70 STM to the left upper trap and neck Nustep level 5 x 500 steps in 8 minutes Gait HHA and w/c follow 120 feet and then after a rest 100 feet some cues for left foot placement 5# left leg marches 5# left LAQ Left hip red tband adduction Left ankle PF/DF  08/19/24 NuStep L 5 x 9 min 500 step s green tband ankle PF/DF LLE  HS curls red 2x10   LAQ 3lb 2x15 Hip add ball squeeze Hip abduction green 2x15 Approximation L digits STM to the left upper trap and into the neck and rhomboid Gait with HHA and w/c follow 100 feet then a rest and 40 feet  08/14/24 Approximation L digits STM to the left upper trap and into the neck and rhomboid Nustep level 5 x 7:42 minutes 500 steps Blue tband ankle PF/DF LLE HS Curls red 2x15 LAQ 3lb 2x15 Hip add ball squeeze Gait with HHA and W/C follow x 70feet    08/12/24 STM to the left upper trap and into the neck and rhomboid Approximation L digits Nustep level 5 x 9 minutes 3# LAQ 3# marches Blue tband ankle PF/DF Gait with HHA and W/C follow x50 feet and then x 60 feet, the left knee was buckling a little more today  08/07/24 STM to the left upper trap and into the neck and rhomboid Nustep level 5 x 9 minutes 3# LAQ 3# marches Blue tband hip abduction Blue tband ankle PF/DF Gait with HHA and W/C follow x100 feet and then x 50 feet, the left knee was buckling a little more today  08/05/24 STM to the left upper trap area Nustep level 5 x 8 minutes Seated partial sit ups Seated elbow touches to side 5# LAQ 5# marches  Blue tband clamshells Gait with HHA and w/c follow 120 feet then a rest and 60 feet  07/31/24 NuStep L 5 x 9 min STM to UT and cervical spine Approximation t0 L digits  Goals Red tband hip adduction, abduction LLE only  LAQ RLE 5lb 2x15 HS curls RLE red 2x15 Gait HHA and w/c follow 100 feet  07/29/24 Nustep level 5 x 8 minutes HS curls red 2x15 LAQ 5lb 2x15 Seated March 5lb 2x10  L ankle PR/DF red 2x10 Red tband hip adduction, abduction LLE only  Gait HHA and w/c follow 60 feet, 35ft, RLE externally rotated  07/22/24 Nustep level 5 x 8 minutes 5# LAQ and marches Red tband HS curl Red tband hip adduction, abduction Gait HHA and w/c follow 60 feet, RLE externally rotated  07/17/24 Nustep level 5 x 8 minutes STM to the left upper trap and neck  area Gait 80 feet with HHA and w/c follow Red tband hip adduction, abduction Red tband HS curl Red tband ankle PF/DF 5# LAQ and marches Gait HHA and w/c follow 70 feet,   07/15/24 STM to UT and cervical spine L digit approximation NuStep L 5 x 8 min Goals  LAQ 3lb 2x10 Seated March 3lb 2x10 Hs curls green 2x10 STM to the left upper trap and neck Gait with HHA and w/c follow x 100 feet one seated rest    PATIENT EDUCATION:  Education details: POC Person educated: Patient Education method: Explanation Education comprehension: verbalized understanding  HOME EXERCISE PROGRAM: 06/10/24  Marches, LAQ, ankle PF/DF, left hip adduction and abduction  ASSESSMENT:  CLINICAL IMPRESSION: 30th visit.  Able to walk 120 feet, rest and then do 100 feet with HHA and w/c follow.   Cue not to lean back needed with LAQ.   Cues for full ROM needed with HS curls. Some fatigue noted with gait but able to increase distance.  She has improved greatly, her and her caregiver are trying to do more exercises at home the only issue is she does not feel safe with walking as it takes two people here one for HHA and one for w/c follow.  I am looking to back her down to one time per week, and set up almost a maintenance type program due to her inability to walk at home, Us  walking with her here is a great benefit to her physically with the weight bearing and the exertion.   OBJECTIVE IMPAIRMENTS: Abnormal gait, cardiopulmonary status limiting activity, decreased activity tolerance, decreased balance, decreased coordination, decreased endurance, decreased mobility, difficulty walking, decreased ROM, decreased strength, increased edema, impaired flexibility, improper body mechanics, postural dysfunction, and pain.   REHAB POTENTIAL: Good  CLINICAL DECISION MAKING: Stable/uncomplicated  EVALUATION COMPLEXITY: Low   GOALS: Goals reviewed with patient? Yes  SHORT TERM GOALS: Target date: 05/28/24 Independent  with advanced HEP Goal status: progressing as she has one caregive that can do this 05/20/24  LONG TERM GOALS: Target date: 08/04/24  Independent with advanced HEP with caregiver Goal status: progressing 08/05/24  2.  Transfer with set up and CGA Goal status: will probably not meet this goal she needs Min A 07/15/24, Ongoing 07/31/24  this goal will not be met D/C this goal 09/02/24  3.  Walk HHA x 150 feet Goal status: Progressing 07/17/24 able to do 80 and 70 feet, cues needed for foot placement, Progressing 08/05/24 09/02/24  4.  Increase left LE strength to 4/5 Goal status: progressing 07/10/24, Met 07/31/24  5.  Report neck  and shoulder pain decreased 50% Goal status: progressing 07/10/24, Progressing 20% 07/15/24  08/05/24 09/02/24  PLAN:  PT FREQUENCY: 2x/week  PT DURATION: 12 weeks  PLANNED INTERVENTIONS: Therapeutic exercises, Therapeutic activity, Neuromuscular re-education, Balance training, Gait training, Patient/Family education, Self Care, Joint mobilization, Moist heat, Taping, and Manual therapy  PLAN FOR NEXT SESSION:  Will ask for more visits on this renewal and decrease to [place], PT 09/02/2024, 11:12 AM  "

## 2024-09-09 ENCOUNTER — Ambulatory Visit: Admitting: Physical Therapy

## 2024-09-09 ENCOUNTER — Ambulatory Visit: Admitting: Podiatry

## 2024-09-11 ENCOUNTER — Encounter: Payer: Self-pay | Admitting: Physical Therapy

## 2024-09-11 ENCOUNTER — Ambulatory Visit: Admitting: Physical Therapy

## 2024-09-11 DIAGNOSIS — I6930 Unspecified sequelae of cerebral infarction: Secondary | ICD-10-CM

## 2024-09-11 DIAGNOSIS — M6281 Muscle weakness (generalized): Secondary | ICD-10-CM | POA: Diagnosis not present

## 2024-09-11 DIAGNOSIS — M25512 Pain in left shoulder: Secondary | ICD-10-CM

## 2024-09-11 DIAGNOSIS — M25572 Pain in left ankle and joints of left foot: Secondary | ICD-10-CM

## 2024-09-11 DIAGNOSIS — I69354 Hemiplegia and hemiparesis following cerebral infarction affecting left non-dominant side: Secondary | ICD-10-CM

## 2024-09-11 DIAGNOSIS — R262 Difficulty in walking, not elsewhere classified: Secondary | ICD-10-CM

## 2024-09-11 DIAGNOSIS — R2681 Unsteadiness on feet: Secondary | ICD-10-CM

## 2024-09-11 DIAGNOSIS — R270 Ataxia, unspecified: Secondary | ICD-10-CM

## 2024-09-11 DIAGNOSIS — M542 Cervicalgia: Secondary | ICD-10-CM

## 2024-09-11 NOTE — Therapy (Signed)
 " OUTPATIENT PHYSICAL THERAPY LOWER EXTREMITY TREATMENT      Patient Name: Karen Jackson MRN: [id number] DOB:[birth year], 84 y.o., female Today's Date: 09/11/2024  END OF SESSION:  PT End of Session - 09/11/24 1108     Visit Number 31    Date for Recertification  10/12/24    Authorization Type Medicare    PT Start Time 1058    PT Stop Time 1145    PT Time Calculation (min) 47 min    Activity Tolerance Patient tolerated treatment well    Behavior During Therapy [facility] for tasks assessed/performed            Past Medical History:  Diagnosis Date   Acute cystitis without hematuria    Acute diastolic CHF (congestive heart failure) (HCC)    Arthritis    Dyspnea    Dysrhythmia    Fever of unknown origin 03/19/2017   Hyperlipidemia    Hypertension    denies at preop   Multifocal pneumonia    Neuromuscular disorder (HCC)    neuropathy left arm and foot   Osteopenia    Paralysis (HCC)    partial left side from CVA    Persistent atrial fibrillation (HCC)    PONV (postoperative nausea and vomiting)    Pre-diabetes    Stroke [facility]) 2013   hemmorahgic   Past Surgical History:  Procedure Laterality Date   ANKLE SURGERY     APPENDECTOMY     CHOLECYSTECTOMY     HARDWARE REMOVAL Left 03/29/2023   Procedure: HARDWARE REMOVAL;  Surgeon: Kit Rush, MD;  Location: [facility];  Service: Orthopedics;  Laterality: Left;   HERNIA REPAIR     Esophagus   INCISION AND DRAINAGE OF WOUND Left 03/29/2023   Procedure: LEFT ANKLE WOUND IRRIGATION AND DEBRIDEMENT WOUND;  Surgeon: Kit Rush, MD;  Location: [facility];  Service: Orthopedics;  Laterality: Left;   JOINT REPLACEMENT     total- right partial- left   MASTECTOMY PARTIAL / LUMPECTOMY  2012   left   ORIF ANKLE FRACTURE Left 07/20/2018   Procedure: OPEN REDUCTION INTERNAL FIXATION (ORIF) ANKLE FRACTURE;  Surgeon: Kit Rush, MD;  Location: [facility];  Service: Orthopedics;  Laterality: Left;   TOTAL KNEE  ARTHROPLASTY Left 01/27/2019   Procedure: TOTAL KNEE ARTHROPLASTY;  Surgeon: Rubie Kemps, MD;  Location: [facility];  Service: Orthopedics;  Laterality: Left;   Patient Active Problem List   Diagnosis Date Noted   Senile osteoporosis 06/02/2024   Goals of care, counseling/discussion 01/17/2022   Grief 01/17/2022   Secondary hypercoagulable state 03/16/2021   Dizziness 03/10/2021   Orthostatic hypotension 10/15/2020   Presbycusis of both ears 03/08/2020   Mixed stress and urge urinary incontinence 12/02/2019   Macrocytosis 12/01/2019   Nutritional anemia 12/01/2019   S/P total knee replacement 01/27/2019   Recurrent left knee instability 07/05/2018   Respiratory failure with hypoxia (HCC) 08/30/2017   Hypoxemia    Heart failure with preserved ejection fraction (HCC), Grade 3 diastolic dysfunction 03/26/2017   PAF (paroxysmal atrial fibrillation) (HCC)    Dyspnea 03/19/2017   Encounter for preventive health examination 02/17/2016   Sensorineural hearing loss (SNHL), bilateral 01/26/2016   Hypomagnesemia 04/24/2014   Hemiparesis affecting left side as late effect of cerebrovascular accident (HCC) 04/24/2014   Nontraumatic cerebral hemorrhage (HCC) 04/30/2012   DM (diabetes mellitus) with complications (HCC) 03/04/2010   OSTEOPENIA 01/21/2009   UNSPECIFIED VITAMIN D  DEFICIENCY 11/19/2007   HYPERCHOLESTEROLEMIA 10/25/2006   GASTROESOPHAGEAL REFLUX, NO  ESOPHAGITIS 10/25/2006   DIVERTICULOSIS OF COLON 10/25/2006   Osteoarthritis 10/25/2006   CERVICAL SPINE DISORDER, NOS 10/25/2006    PCP: Shayne, MD  REFERRING PROVIDER: Shayne, MD  REFERRING DIAG: debility  THERAPY DIAG:  Muscle weakness (generalized)  Difficulty in walking, not elsewhere classified  Unsteadiness on feet  Hemiplegia and hemiparesis following cerebral infarction affecting left non-dominant side (HCC)  Left shoulder pain, unspecified chronicity  Late effects of CVA (cerebrovascular  accident)  Ataxia  Cervicalgia  Pain in left ankle and joints of left foot  Rationale for Evaluation and Treatment: Rehabilitation  ONSET DATE: 04/29/24  SUBJECTIVE:   SUBJECTIVE STATEMENT: Patient reports that she will see pulmonary MD next week,  Has a new transport chair but has questions see below. Patient has been a patient of mine off and on for many years, she has had many different diagnoses, she had a CVA about 13 years ago, she had a left ankle fracture with ORIF, she had a lot of issues with pain and wounds, so the hardware was removed last August, she has done pretty well but tends to back slide without PT with her ability to walk and transfer.  She has a caregiver about 11 hours a day.  She has been able to walk with me in PT, no one else has walked with her   PERTINENT HISTORY: CVA, CHF, HTN, A-fib, TKA PAIN:  Are you having pain? Yes: NPRS scale: 5/10 Pain location: knee Pain description: sore Aggravating factors: neck and shoulder always hurt, knee and ankle hurt with transfers Relieving factors: massage, heat  PRECAUTIONS: None  RED FLAGS: None   WEIGHT BEARING RESTRICTIONS: No  FALLS:  Has patient fallen in last 6 months? No  LIVING ENVIRONMENT: Lives with: lives alone Lives in: House/apartment Stairs: No Has following equipment at home: Environmental Consultant - 2 wheeled, Wheelchair (manual), shower chair, bed side commode, Grab bars, and Ramped entry care giver 11 hours a day  OCCUPATION: retired  PLOF: Needs assistance with homemaking, Needs assistance with transfers, and Leisure: plays bridge, she has 11 hours of an aide at home that helps with meals, going to MD's, dressing and bathing, is alone at night  PATIENT GOALS: walk, have less pain, transfer without difficulty  NEXT MD VISIT: none scheduled  OBJECTIVE:   DIAGNOSTIC FINDINGS: none performed  COGNITION: Overall cognitive status: Within functional limits for tasks  assessed     SENSATION: WFL POSTURE: rounded shoulders and forward head  PALPATION: Left knee and ankle are swollen and tender, left upper and neck  LOWER EXTREMITY ROM:  Active ROM Left eval Left 05/15/24 Left 07/15/24 Left 07/22/24 07/31/24 Left  Hip flexion       Hip extension       Hip abduction       Hip adduction       Hip internal rotation       Hip external rotation       Knee flexion 90 91 93 95 95  Knee extension 0 0 0 0 0  Ankle dorsiflexion 5      Ankle plantarflexion 30      Ankle inversion 5      Ankle eversion 0       (Blank rows = not tested)  LOWER EXTREMITY MMT:  MMT Right eval Left eval Right 07/15/24 Left 07/15/24 Left 07/22/24 Left 07/31/24  Hip flexion 3+ 3+ 4- 4- 4 4  Hip extension        Hip abduction 4- 3+ 4  4    Hip adduction 4- 3+      Hip internal rotation        Hip external rotation        Knee flexion 4- 3+ 4 4- 4 4  Knee extension 4- 3+ 4 4 4 5   Ankle dorsiflexion  3+  4 4 5   Ankle plantarflexion        Ankle inversion        Ankle eversion         (Blank rows = not tested)  FUNCTIONAL TESTS:  Transfers Max A, left knee gives out and goes into ER and tends to bow into varus, has not walked since I discharged her 12/27/23.    GAIT: Distance walked: with HHA and w/c follow x 40 feet with pain in the left knee a 7/10, she tends to rotate the left hip posterior and really bring the left knee into extension   TODAY'S TREATMENT:                                                                                                                              DATE:  09/11/24 STM to the left upper trap and neck Nustep level 5 x 8 minutes Green tband left HS curls Green tband left ankle PF/DF Worked on the w/c and then educated caregiver on how to lock the right leg rest back as it is not working properly but can be locked with an extra step Tried some right green tband row, extension and triceps, able to do about 6 of each due to pain in the  elbow Gait with HHA and w/c follow 100 feet x 2, some increased cues needed today due to the left leg being placed out too far, had one toe drag.  09/02/24 BP 125/70 STM to the left upper trap and neck Nustep level 5 x 500 steps in 8 minutes Gait HHA and w/c follow 120 feet and then after a rest 100 feet some cues for left foot placement 5# left leg marches 5# left LAQ Left hip red tband adduction Left ankle PF/DF  08/19/24 NuStep L 5 x 9 min 500 step s green tband ankle PF/DF LLE  HS curls red 2x10  LAQ 3lb 2x15 Hip add ball squeeze Hip abduction green 2x15 Approximation L digits STM to the left upper trap and into the neck and rhomboid Gait with HHA and w/c follow 100 feet then a rest and 40 feet  08/14/24 Approximation L digits STM to the left upper trap and into the neck and rhomboid Nustep level 5 x 7:42 minutes 500 steps Blue tband ankle PF/DF LLE HS Curls red 2x15 LAQ 3lb 2x15 Hip add ball squeeze Gait with HHA and W/C follow x 45feet    08/12/24 STM to the left upper trap and into the neck and rhomboid Approximation L digits Nustep level 5 x 9 minutes 3# LAQ 3# marches Blue tband ankle PF/DF Gait  with HHA and W/C follow x50 feet and then x 60 feet, the left knee was buckling a little more today  08/07/24 STM to the left upper trap and into the neck and rhomboid Nustep level 5 x 9 minutes 3# LAQ 3# marches Blue tband hip abduction Blue tband ankle PF/DF Gait with HHA and W/C follow x100 feet and then x 50 feet, the left knee was buckling a little more today  08/05/24 STM to the left upper trap area Nustep level 5 x 8 minutes Seated partial sit ups Seated elbow touches to side 5# LAQ 5# marches Blue tband clamshells Gait with HHA and w/c follow 120 feet then a rest and 60 feet  07/31/24 NuStep L 5 x 9 min STM to UT and cervical spine Approximation t0 L digits  Goals Red tband hip adduction, abduction LLE only  LAQ RLE 5lb 2x15 HS curls RLE red  2x15 Gait HHA and w/c follow 100 feet  07/29/24 Nustep level 5 x 8 minutes HS curls red 2x15 LAQ 5lb 2x15 Seated March 5lb 2x10  L ankle PR/DF red 2x10 Red tband hip adduction, abduction LLE only  Gait HHA and w/c follow 60 feet, 65ft, RLE externally rotated  07/22/24 Nustep level 5 x 8 minutes 5# LAQ and marches Red tband HS curl Red tband hip adduction, abduction Gait HHA and w/c follow 60 feet, RLE externally rotated  07/17/24 Nustep level 5 x 8 minutes STM to the left upper trap and neck area Gait 80 feet with HHA and w/c follow Red tband hip adduction, abduction Red tband HS curl Red tband ankle PF/DF 5# LAQ and marches Gait HHA and w/c follow 70 feet,   07/15/24 STM to UT and cervical spine L digit approximation NuStep L 5 x 8 min Goals  LAQ 3lb 2x10 Seated March 3lb 2x10 Hs curls green 2x10 STM to the left upper trap and neck Gait with HHA and w/c follow x 100 feet one seated rest    PATIENT EDUCATION:  Education details: POC Person educated: Patient Education method: Explanation Education comprehension: verbalized understanding  HOME EXERCISE PROGRAM: 06/10/24  Marches, LAQ, ankle PF/DF, left hip adduction and abduction  ASSESSMENT:  CLINICAL IMPRESSION:  Able to walk 120 feet, rest and then do 100 feet with HHA and w/c follow the last visit, today was 2x100 feet.   Cue not to lean back needed with LAQ.   Cues for full ROM needed with HS curls. Some fatigue noted with gait but able to increase distance.  She has improved greatly, her and her caregiver are trying to do more exercises at home the only issue is she does not feel safe with walking as it takes two people here one for HHA and one for w/c follow.  I am looking to back her down to one time per week, and set up almost a maintenance type program due to her inability to walk at home, Us  walking with her here is a great benefit to her physically with the weight bearing and the exertion.   OBJECTIVE  IMPAIRMENTS: Abnormal gait, cardiopulmonary status limiting activity, decreased activity tolerance, decreased balance, decreased coordination, decreased endurance, decreased mobility, difficulty walking, decreased ROM, decreased strength, increased edema, impaired flexibility, improper body mechanics, postural dysfunction, and pain.   REHAB POTENTIAL: Good  CLINICAL DECISION MAKING: Stable/uncomplicated  EVALUATION COMPLEXITY: Low   GOALS: Goals reviewed with patient? Yes  SHORT TERM GOALS: Target date: 05/28/24 Independent with advanced HEP Goal status: progressing as she  has one caregive that can do this 05/20/24  LONG TERM GOALS: Target date: 08/04/24  Independent with advanced HEP with caregiver Goal status: progressing 08/05/24  2.  Transfer with set up and CGA Goal status: will probably not meet this goal she needs Min A 07/15/24, Ongoing 07/31/24  this goal will not be met D/C this goal 09/02/24  3.  Walk HHA x 150 feet Goal status: Progressing 07/17/24 able to do 80 and 70 feet, cues needed for foot placement, Progressing 08/05/24 09/02/24  4.  Increase left LE strength to 4/5 Goal status: progressing 07/10/24, Met 07/31/24  5.  Report neck and shoulder pain decreased 50% Goal status: progressing 07/10/24, Progressing 20% 07/15/24  08/05/24 09/02/24  PLAN:  PT FREQUENCY: 2x/week  PT DURATION: 12 weeks  PLANNED INTERVENTIONS: Therapeutic exercises, Therapeutic activity, Neuromuscular re-education, Balance training, Gait training, Patient/Family education, Self Care, Joint mobilization, Moist heat, Taping, and Manual therapy  PLAN FOR NEXT SESSION:  renewal done, will transition to a 1x/week maintenance program as she does not have a caregiver that can walk with her   OBADIAH OZELL ORN, PT 09/11/2024, 11:09 AM  "

## 2024-09-15 ENCOUNTER — Ambulatory Visit (INDEPENDENT_AMBULATORY_CARE_PROVIDER_SITE_OTHER)

## 2024-09-15 ENCOUNTER — Encounter (HOSPITAL_BASED_OUTPATIENT_CLINIC_OR_DEPARTMENT_OTHER): Payer: Self-pay

## 2024-09-15 ENCOUNTER — Ambulatory Visit (HOSPITAL_BASED_OUTPATIENT_CLINIC_OR_DEPARTMENT_OTHER)

## 2024-09-15 VITALS — BP 118/68 | HR 69 | Ht 59.0 in | Wt 145.0 lb

## 2024-09-15 DIAGNOSIS — Z87891 Personal history of nicotine dependence: Secondary | ICD-10-CM

## 2024-09-15 DIAGNOSIS — J8410 Pulmonary fibrosis, unspecified: Secondary | ICD-10-CM

## 2024-09-15 DIAGNOSIS — R053 Chronic cough: Secondary | ICD-10-CM

## 2024-09-15 DIAGNOSIS — I272 Pulmonary hypertension, unspecified: Secondary | ICD-10-CM

## 2024-09-15 DIAGNOSIS — I480 Paroxysmal atrial fibrillation: Secondary | ICD-10-CM

## 2024-09-15 LAB — PULMONARY FUNCTION TEST
DL/VA % pred: 109 %
DL/VA: 4.64 ml/min/mmHg/L
DLCO unc % pred: 67 %
DLCO unc: 10.15 ml/min/mmHg
FEF 25-75 Post: 1.02 L/s
FEF 25-75 Pre: 0.81 L/s
FEF2575-%Change-Post: 26 %
FEF2575-%Pred-Post: 117 %
FEF2575-%Pred-Pre: 92 %
FEV1-%Change-Post: 5 %
FEV1-%Pred-Post: 74 %
FEV1-%Pred-Pre: 70 %
FEV1-Post: 0.94 L
FEV1-Pre: 0.89 L
FEV1FVC-%Change-Post: 0 %
FEV1FVC-%Pred-Pre: 110 %
FEV6-%Change-Post: 6 %
FEV6-%Pred-Post: 72 %
FEV6-%Pred-Pre: 68 %
FEV6-Post: 1.17 L
FEV6-Pre: 1.1 L
FEV6FVC-%Pred-Post: 107 %
FEV6FVC-%Pred-Pre: 107 %
FVC-%Change-Post: 6 %
FVC-%Pred-Post: 67 %
FVC-%Pred-Pre: 63 %
FVC-Post: 1.17 L
FVC-Pre: 1.1 L
Post FEV1/FVC ratio: 80 %
Post FEV6/FVC ratio: 100 %
Pre FEV1/FVC ratio: 81 %
Pre FEV6/FVC Ratio: 100 %
RV % pred: 131 %
RV: 2.86 L
TLC % pred: 101 %
TLC: 4.23 L

## 2024-09-15 MED ORDER — LEVALBUTEROL TARTRATE 45 MCG/ACT IN AERO: 2.0000 | INHALATION_SPRAY | RESPIRATORY_TRACT | Status: DC | PRN

## 2024-09-15 NOTE — Progress Notes (Signed)
 Full PFT performed today.

## 2024-09-15 NOTE — Patient Instructions (Signed)
 Full PFT performed today.

## 2024-09-15 NOTE — Progress Notes (Signed)
 "  @Patient  ID: Karen Jackson, female    DOB: [birth year], 84 y.o.   MRN: [id number]  Chief Complaint  Patient presents with   Follow-up    PFT     Referring provider: Shayne Anes, MD  HPI: Discussed the use of AI scribe software for clinical note transcription with the patient, who gave verbal consent to proceed.  History of Present Illness Karen Jackson is an 84 year old female with pulmonary hypertension who presents with chronic cough and persistent phlegm.  She experiences persistent phlegm production, particularly in the mornings upon waking. She states that this has been going on for some time.  She does not take any medication for this.  Despite this, she breathes well throughout the day and sleeps well at night.  She has a history of pulmonary hypertension and underwent a lung function test today which showed a restrictive pattern, normal lung volumes, and reduced DLCO. She uses albuterol  as needed, taking two puffs in the morning, but it causes palpitations and jitteriness without significant improvement in her breathing.  PFTs did not demonstrate improvement in FEV1 with the use of an inhaler.  She is not currently using Incruse as listed on her med list, an anticholinergic inhaler, which she recalls but has not used in months. She takes Lasix  almost every day except Friday or Sunday.  She had a CT scan, which was compared to previous scans. She has a history of smoking, which she quit many years ago, and a past severe case of pneumonia that resulted in scar tissue.  Last OV 07/07/2024: Karen Jackson is an 84 year old female with pulmonary hypertension and interstitial lung disease who presents with worsening shortness of breath.   She experiences progressive worsening of shortness of breath, now only able to walk 30 to 40 feet before needing to rest. This has gradually deteriorated over time.     She has a history of a cough producing clear or white sputum, which she describes as  'clear stuff'. She does not frequently examine the sputum but notes it is mostly clear or white.   She has tried using a Symbicort  inhaler in the past without significant relief and is currently using Incruse, which she feels has not helped much either. She has albuterol  at home but has not been using it regularly.   Her past medical history includes pulmonary hypertension and interstitial lung disease. She had a CT scan in June 2023. She also has a history of atrial fibrillation and is on medications including Eliquis , atorvastatin , and metoprolol . She takes Lasix  daily except on Fridays to avoid interruptions during bridge games.   She has had previous pulmonary function tests. She has not had a recent lung function test with full volumes and gas exchange measurements.   She denies fever, chills, night sweats, weight loss, chest pain, palpitations, or other c/o.  TEST/EVENTS :  Chest CT 12/Jackson/2025: IMPRESSION: 1. Chronic tracheobronchomalacia. 2. Mild patchy subpleural reticulation with subpleural parenchymal bands and associated mild traction bronchiolectasis, with a basilar predominance, without significant interval progression. As previously discussed, favor bland post-infectious / post-inflammatory scarring, although UIP is not excluded. Findings are indeterminate for UIP per consensus guidelines: Diagnosis of Idiopathic Pulmonary Fibrosis: An Official ATS/ERS/JRS/ALAT Clinical Practice Guideline. Am Karen Jackson, Karen Jackson, Karen Jackson, Karen Jackson. 3. Dilated main pulmonary artery, compatible with reported pulmonary hypertension.  PFT 09/15/2024:  restrictive pattern suggested by FEV1, FVC, and FEV1/FVC ratios with normal  volumes and reduced DLCO suggesting a mixed restrictive and obstructive pattern.  Minimal response to bronchodilator.  PFT completed 12/13/2021 with restrictive pattern suggested by FEV1, FVC, and FEV1/FVC ratios.  No lung volumes completed.  No response to  bronchodilator.   Echocardiogram completed 05/09/2022: Right ventricular size was mildly enlarged with estimated RVSP PA at 44.2 mmHg left atrial size was moderately dilated as well no aortic stenosis   Allergies[1]  Immunization History  Administered Date(s) Administered   Fluad Quad(high Dose 65+) 06/17/2020, 05/05/2021   INFLUENZA, HIGH DOSE SEASONAL PF 04/29/2014   Influenza Split 06/20/2012   Influenza Whole 06/07/2007, 05/28/2008, 06/03/2010   Influenza,inj,Quad PF,6+ Mos 05/16/2013, 04/29/2014, 06/17/2015, 09/13/Jackson, 05/23/2018, 05/15/2019   PFIZER(Purple Top)SARS-COV-2 Vaccination 09/19/2019, 10/10/2019   Pneumococcal Conjugate-13 05/16/2013   Pneumococcal Polysaccharide-23 06/28/2002, 07/12/2007   Td 06/29/1999, 02/24/2009   Zoster, Live 09/28/2005    Past Medical History:  Diagnosis Date   Acute cystitis without hematuria    Acute diastolic CHF (congestive heart failure) (HCC)    Arthritis    Dyspnea    Dysrhythmia    Fever of unknown origin 07/23/Jackson   Hyperlipidemia    Hypertension    denies at preop   Multifocal pneumonia    Neuromuscular disorder (HCC)    neuropathy left arm and foot   Osteopenia    Paralysis (HCC)    partial left side from CVA    Persistent atrial fibrillation (HCC)    PONV (postoperative nausea and vomiting)    Pre-diabetes    Stroke [facility]) 2013   hemmorahgic    Tobacco History: Tobacco Use History[2] Counseling given: Not Answered   Outpatient Medications Prior to Visit  Medication Sig Dispense Refill   apixaban  (ELIQUIS ) Jackson MG TABS tablet Take 1 tablet (Jackson mg total) by mouth 2 (two) times daily. 60 tablet 11   atorvastatin  (LIPITOR) 40 MG tablet TAKE 1 TABLET(40 MG) BY MOUTH DAILY 90 tablet 3   Calcium  Carb-Cholecalciferol (CALCIUM  CARBONATE-VITAMIN D3) 600-400 MG-UNIT TABS Take 1 tablet by mouth daily.     celecoxib  (CELEBREX ) 200 MG capsule TAKE 1 CAPSULE BY MOUTH DAILY AS NEEDED FOR ARTHRITIS OR PAIN 90 capsule 3   diclofenac   Sodium (VOLTAREN ) 1 % GEL APPLY 2 GRAMS EXTERNALLY TO THE AFFECTED AREA FOUR TIMES DAILY     docusate sodium  (COLACE) 100 MG capsule Take 1 capsule (100 mg total) by mouth 2 (two) times daily. While taking narcotic pain medicine. 30 capsule 0   ergocalciferol  (VITAMIN D2) 1.25 MG (50000 UT) capsule 50,000 unit     furosemide  (LASIX ) 80 MG tablet Take 1 tablet (80 mg total) by mouth every other day. 90 tablet 3   gabapentin  (NEURONTIN ) 100 MG capsule TAKE 1 CAPSULE BY MOUTH 3  TIMES DAILY 270 capsule 3   hydrocortisone 2.Jackson % cream Apply topically 2 (two) times daily as needed.     lidocaine  (LIDODERM ) Jackson % PLACE 1 PATCH ONTO THE SKIN DAILY. REMOVE AND DISCORD PATCH WITHIN 12 HOURS OR AS DIRECTED BY MD 30 patch 12   MAGnesium -Oxide 400 (240 Mg) MG tablet Take 1 tablet (400 mg total) by mouth 2 (two) times daily. 90 tablet 3   metoprolol  succinate (TOPROL -XL) 50 MG 24 hr tablet Take 1 tablet (50 mg total) by mouth daily. Take with or immediately following a meal. 90 tablet 3   omeprazole  (PRILOSEC) 20 MG capsule TAKE 1 CAPSULE BY MOUTH  DAILY 90 capsule 3   potassium chloride  SA (KLOR-CON  M20) 20 MEQ tablet Take 1  tablet (20 mEq total) by mouth daily. 180 tablet 3   Semaglutide , 1 MG/DOSE, (OZEMPIC , 1 MG/DOSE,) 4 MG/3ML SOPN Inject 1 mg into the skin once a week.     senna (SENOKOT) 8.6 MG TABS tablet Take 2 tablets (17.2 mg total) by mouth 2 (two) times daily. 30 tablet 0   traZODone  (DESYREL ) 50 MG tablet TAKE 1 TABLET BY MOUTH AT  BEDTIME 90 tablet 3   umeclidinium bromide  (INCRUSE ELLIPTA ) 62.Jackson MCG/ACT AEPB Inhale 1 puff into the lungs daily. 30 each Jackson   albuterol  (VENTOLIN  HFA) 108 (90 Base) MCG/ACT inhaler Inhale 2 puffs into the lungs every 6 (six) hours as needed for wheezing or shortness of breath. 8 g 6   No facility-administered medications prior to visit.     Review of Systems: as per hpi  Constitutional:   No  weight loss, night sweats,  Fevers, chills, fatigue, or   lassitude.  HEENT:   No headaches,  Difficulty swallowing,  Tooth/dental problems, or  Sore throat,                No sneezing, itching, ear ache, nasal congestion, post nasal drip,   CV:  No chest pain,  Orthopnea, PND, swelling in lower extremities, anasarca, dizziness, palpitations, syncope.   GI  No heartburn, indigestion, abdominal pain, nausea, vomiting, diarrhea, change in bowel habits, loss of appetite, bloody stools.   Resp: No shortness of breath with exertion or at rest.  No excess mucus, no productive cough,  No non-productive cough,  No coughing up of blood.  No change in color of mucus.  No wheezing.  No chest wall deformity  Skin: no rash or lesions.  GU: no dysuria, change in color of urine, no urgency or frequency.  No flank pain, no hematuria   MS:  No joint pain or swelling.  No decreased range of motion.  No back pain.    Physical Exam  BP 118/68   Pulse 69   Ht 4' 11 (1.499 m)   Wt 145 lb (65.8 kg)   SpO2 94%   BMI 29.29 kg/m   GEN: A/Ox3; pleasant , NAD, well nourished.  Speaks in full sentences.   HEENT:  [place]/AT,  EACs-clear, TMs-wnl, NOSE-clear, THROAT-clear, no lesions, no postnasal drip or exudate noted.   NECK:  Supple w/ fair ROM; no JVD; normal carotid impulses w/o bruits; no thyromegaly or nodules palpated; no lymphadenopathy.    RESP  Clear  P & A; w/o, wheezes/ rales/ or rhonchi. no accessory muscle use, no dullness to percussion  CARD:  RRR, no m/r/g, no peripheral edema, pulses intact, no cyanosis or clubbing.  GI:   Soft & nt; nml bowel sounds; no organomegaly or masses detected.   Musco: Warm bil, no deformities or joint swelling noted.   Neuro: alert, no focal deficits noted.    Skin: Warm, no lesions or rashes    Lab Results:  CBC    Component Value Date/Time   WBC 7.3 01/25/2023 1550   WBC 6.Jackson 03/10/2021 1244   RBC 4.34 01/25/2023 1550   RBC 4.48 03/10/2021 1244   HGB 14.9 01/25/2023 1550   HCT 42.9 01/25/2023 1550    PLT 295 01/25/2023 1550   MCV 99 (H) 01/25/2023 1550   MCH 34.3 (H) 01/25/2023 1550   MCH 32.1 03/10/2021 1244   MCHC 34.7 01/25/2023 1550   MCHC 33.7 03/10/2021 1244   RDW 12.2 01/25/2023 1550   LYMPHSABS 1.2 03/10/2021 1244   MONOABS  0.6 03/10/2021 1244   EOSABS 0.1 03/10/2021 1244   BASOSABS 0.0 03/10/2021 1244    BMET    Component Value Date/Time   NA 137 05/06/2024 1149   K 3.9 05/06/2024 1149   CL 92 (L) 05/06/2024 1149   CO2 26 05/06/2024 1149   GLUCOSE 101 (H) 05/06/2024 1149   GLUCOSE 136 (H) 03/10/2021 1244   BUN 13 05/06/2024 1149   CREATININE 0.63 05/06/2024 1149   CREATININE 0.64 03/14/2016 1020   CALCIUM  9.6 05/06/2024 1149   CALCIUM  10.7 (H) 03/04/2010 0000   GFRNONAA >60 03/10/2021 1244   GFRNONAA 88 03/14/2016 1020   GFRAA >60 01/28/2019 0435   GFRAA >89 03/14/2016 1020    BNP    Component Value Date/Time   BNP 118.8 (H) 07/26/2021 1648   BNP 144.7 (H) 03/10/2021 1244    ProBNP No results found for: PROBNP  Imaging: No results found.  Administration History     None          Latest Ref Rng & Units 09/15/2024    9:53 AM 12/13/2021    4:06 PM  PFT Results  FVC-Pre L 1.10  P 1.07   FVC-Predicted Pre % 63  P 57   FVC-Post L 1.17  P 1.05   FVC-Predicted Post % 67  P 56   Pre FEV1/FVC % % 81  P 80   Post FEV1/FCV % % 80  P 83   FEV1-Pre L 0.89  P 0.86   FEV1-Predicted Pre % 70  P 62   FEV1-Post L 0.94  P 0.87   DLCO uncorrected ml/min/mmHg 10.15  P   DLCO UNC% % 67  P   DLVA Predicted % 109  P   TLC L 4.23  P   TLC % Predicted % 101  P   RV % Predicted % 131  P     P Preliminary result    No results found for: NITRICOXIDE   Assessment & Plan:   Assessment & Plan Chronic cough  PAF (paroxysmal atrial fibrillation) (HCC)  Assessment and Plan Assessment & Plan Chronic cough Persistent phlegm production likely due to past smoking and interstitial lung disease. CT scan shows no progression. - Refilled Incruse inhaler  for help with sputum production. - Switched to Xopenex  to reduce side effects.  Pulmonary fibrosis Restrictive lung pattern consistent with past findings. CT scan shows no progression.  - Continue current management.  Pulmonary hypertension DLCO slightly below normal, likely related to pulmonary hypertension. Contributes to shortness of breath. Previous echocardiogram and imaging indicated pulmonary hypertension. No specific medications due to hypotension concerns. On metoprolol  for rate control and heart function. - Continue current medical management for pulmonary hypertension; followed by Cardiology    Return in about 1 year (around 09/15/2025).  Candis Dandy, PA-C 09/15/2024      [1]  Allergies Allergen Reactions   Codeine Phosphate Other (See Comments)  [2]  Social History Tobacco Use  Smoking Status Former   Current packs/day: 0.00   Average packs/day: 0.Jackson packs/day for 23.Jackson years (11.8 ttl pk-yrs)   Types: Cigarettes   Start date: 08/28/1957   Quit date: 03/09/1981   Years since quitting: 43.Jackson  Smokeless Tobacco Never   "

## 2024-09-15 NOTE — Patient Instructions (Addendum)
 Resume Incruse inhaler once daily.  Stop Albuterol .  Start Xopenex  inhaler; use every 6 hours as needed for shortness of breath.  Follow up as needed; one year.

## 2024-09-16 ENCOUNTER — Encounter: Payer: Self-pay | Admitting: Physical Therapy

## 2024-09-16 ENCOUNTER — Ambulatory Visit: Admitting: Physical Therapy

## 2024-09-16 DIAGNOSIS — M25512 Pain in left shoulder: Secondary | ICD-10-CM

## 2024-09-16 DIAGNOSIS — R293 Abnormal posture: Secondary | ICD-10-CM

## 2024-09-16 DIAGNOSIS — M25572 Pain in left ankle and joints of left foot: Secondary | ICD-10-CM

## 2024-09-16 DIAGNOSIS — M6281 Muscle weakness (generalized): Secondary | ICD-10-CM | POA: Diagnosis not present

## 2024-09-16 DIAGNOSIS — R270 Ataxia, unspecified: Secondary | ICD-10-CM

## 2024-09-16 DIAGNOSIS — M542 Cervicalgia: Secondary | ICD-10-CM

## 2024-09-16 DIAGNOSIS — I6930 Unspecified sequelae of cerebral infarction: Secondary | ICD-10-CM

## 2024-09-16 DIAGNOSIS — R262 Difficulty in walking, not elsewhere classified: Secondary | ICD-10-CM

## 2024-09-16 DIAGNOSIS — I69354 Hemiplegia and hemiparesis following cerebral infarction affecting left non-dominant side: Secondary | ICD-10-CM

## 2024-09-16 DIAGNOSIS — R2681 Unsteadiness on feet: Secondary | ICD-10-CM

## 2024-09-16 NOTE — Therapy (Signed)
 " OUTPATIENT PHYSICAL THERAPY LOWER EXTREMITY TREATMENT      Patient Name: Karen Jackson MRN: [id number] DOB:1941-02-08, 83 y.o., female Today's Date: 09/16/2024  END OF SESSION:  PT End of Session - 09/16/24 1108     Visit Number 32    Date for Recertification  10/12/24    Authorization Type Medicare    PT Start Time 1058    PT Stop Time 1144    PT Time Calculation (min) 46 min    Activity Tolerance Patient tolerated treatment well    Behavior During Therapy [facility] for tasks assessed/performed            Past Medical History:  Diagnosis Date   Acute cystitis without hematuria    Acute diastolic CHF (congestive heart failure) (HCC)    Arthritis    Dyspnea    Dysrhythmia    Fever of unknown origin 03/19/2017   Hyperlipidemia    Hypertension    denies at preop   Multifocal pneumonia    Neuromuscular disorder (HCC)    neuropathy left arm and foot   Osteopenia    Paralysis (HCC)    partial left side from CVA    Persistent atrial fibrillation (HCC)    PONV (postoperative nausea and vomiting)    Pre-diabetes    Stroke [facility] 2013   hemmorahgic   Past Surgical History:  Procedure Laterality Date   ANKLE SURGERY     APPENDECTOMY     CHOLECYSTECTOMY     HARDWARE REMOVAL Left 03/29/2023   Procedure: HARDWARE REMOVAL;  Surgeon: Kit Rush, MD;  Location:  [facility];  Service: Orthopedics;  Laterality: Left;   HERNIA REPAIR     Esophagus   INCISION AND DRAINAGE OF WOUND Left 03/29/2023   Procedure: LEFT ANKLE WOUND IRRIGATION AND DEBRIDEMENT WOUND;  Surgeon: Kit Rush, MD;  Location: [facility];  Service: Orthopedics;  Laterality: Left;   JOINT REPLACEMENT     total- right partial- left   MASTECTOMY PARTIAL / LUMPECTOMY  2012   left   ORIF ANKLE FRACTURE Left 07/20/2018   Procedure: OPEN REDUCTION INTERNAL FIXATION (ORIF) ANKLE FRACTURE;  Surgeon: Kit Rush, MD;  Location: [facility];  Service: Orthopedics;  Laterality: Left;   TOTAL KNEE  ARTHROPLASTY Left 01/27/2019   Procedure: TOTAL KNEE ARTHROPLASTY;  Surgeon: Rubie Kemps, MD;  Location: [facility];  Service: Orthopedics;  Laterality: Left;   Patient Active Problem List   Diagnosis Date Noted   Senile osteoporosis 06/02/2024   Goals of care, counseling/discussion 01/17/2022   Grief 01/17/2022   Secondary hypercoagulable state 03/16/2021   Dizziness 03/10/2021   Orthostatic hypotension 10/15/2020   Presbycusis of both ears 03/08/2020   Mixed stress and urge urinary incontinence 12/02/2019   Macrocytosis 12/01/2019   Nutritional anemia 12/01/2019   S/P total knee replacement 01/27/2019   Recurrent left knee instability 07/05/2018   Respiratory failure with hypoxia (HCC) 08/30/2017   Hypoxemia    Heart failure with preserved ejection fraction (HCC), Grade 3 diastolic dysfunction 03/26/2017   PAF (paroxysmal atrial fibrillation) (HCC)    Dyspnea 03/19/2017   Encounter for preventive health examination 02/17/2016   Sensorineural hearing loss (SNHL), bilateral 01/26/2016   Hypomagnesemia 04/24/2014   Hemiparesis affecting left side as late effect of cerebrovascular accident (HCC) 04/24/2014   Nontraumatic cerebral hemorrhage (HCC) 04/30/2012   DM (diabetes mellitus) with complications (HCC) 03/04/2010   OSTEOPENIA 01/21/2009   UNSPECIFIED VITAMIN D  DEFICIENCY 11/19/2007   HYPERCHOLESTEROLEMIA 10/25/2006   GASTROESOPHAGEAL REFLUX, NO  ESOPHAGITIS 10/25/2006   DIVERTICULOSIS OF COLON 10/25/2006   Osteoarthritis 10/25/2006   CERVICAL SPINE DISORDER, NOS 10/25/2006    PCP: Shayne, MD  REFERRING PROVIDER: Shayne, MD  REFERRING DIAG: debility  THERAPY DIAG:  Muscle weakness (generalized)  Difficulty in walking, not elsewhere classified  Unsteadiness on feet  Hemiplegia and hemiparesis following cerebral infarction affecting left non-dominant side (HCC)  Left shoulder pain, unspecified chronicity  Late effects of CVA (cerebrovascular  accident)  Ataxia  Cervicalgia  Pain in left ankle and joints of left foot  Abnormal posture  Rationale for Evaluation and Treatment: Rehabilitation  ONSET DATE: 04/29/24  SUBJECTIVE:   SUBJECTIVE STATEMENT: Saw pulmonologist, changed some medications up.  She reports that she still is congested but doing okay.  Some left shoulder and left ankle pain Patient has been a patient of mine off and on for many years, she has had many different diagnoses, she had a CVA about 13 years ago, she had a left ankle fracture with ORIF, she had a lot of issues with pain and wounds, so the hardware was removed last August, she has done pretty well but tends to back slide without PT with her ability to walk and transfer.  She has a caregiver about 11 hours a day.  She has been able to walk with me in PT, no one else has walked with her   PERTINENT HISTORY: CVA, CHF, HTN, A-fib, TKA PAIN:  Are you having pain? Yes: NPRS scale: 5/10 Pain location: knee Pain description: sore Aggravating factors: neck and shoulder always hurt, knee and ankle hurt with transfers Relieving factors: massage, heat  PRECAUTIONS: None  RED FLAGS: None   WEIGHT BEARING RESTRICTIONS: No  FALLS:  Has patient fallen in last 6 months? No  LIVING ENVIRONMENT: Lives with: lives alone Lives in: House/apartment Stairs: No Has following equipment at home: Environmental Consultant - 2 wheeled, Wheelchair (manual), shower chair, bed side commode, Grab bars, and Ramped entry care giver 11 hours a day  OCCUPATION: retired  PLOF: Needs assistance with homemaking, Needs assistance with transfers, and Leisure: plays bridge, she has 11 hours of an aide at home that helps with meals, going to MD's, dressing and bathing, is alone at night  PATIENT GOALS: walk, have less pain, transfer without difficulty  NEXT MD VISIT: none scheduled  OBJECTIVE:   DIAGNOSTIC FINDINGS: none performed  COGNITION: Overall cognitive status: Within functional  limits for tasks assessed     SENSATION: WFL POSTURE: rounded shoulders and forward head  PALPATION: Left knee and ankle are swollen and tender, left upper and neck  LOWER EXTREMITY ROM:  Active ROM Left eval Left 05/15/24 Left 07/15/24 Left 07/22/24 07/31/24 Left  Hip flexion       Hip extension       Hip abduction       Hip adduction       Hip internal rotation       Hip external rotation       Knee flexion 90 91 93 95 95  Knee extension 0 0 0 0 0  Ankle dorsiflexion 5      Ankle plantarflexion 30      Ankle inversion 5      Ankle eversion 0       (Blank rows = not tested)  LOWER EXTREMITY MMT:  MMT Right eval Left eval Right 07/15/24 Left 07/15/24 Left 07/22/24 Left 07/31/24  Hip flexion 3+ 3+ 4- 4- 4 4  Hip extension  Hip abduction 4- 3+ 4 4    Hip adduction 4- 3+      Hip internal rotation        Hip external rotation        Knee flexion 4- 3+ 4 4- 4 4  Knee extension 4- 3+ 4 4 4 5   Ankle dorsiflexion  3+  4 4 5   Ankle plantarflexion        Ankle inversion        Ankle eversion         (Blank rows = not tested)  FUNCTIONAL TESTS:  Transfers Max A, left knee gives out and goes into ER and tends to bow into varus, has not walked since I discharged her 12/27/23.    GAIT: Distance walked: with HHA and w/c follow x 40 feet with pain in the left knee a 7/10, she tends to rotate the left hip posterior and really bring the left knee into extension   TODAY'S TREATMENT:                                                                                                                              DATE:  09/16/24 STM to the upper traps and neck Nustep level 5 x 7 mintues 500 steps Red tband left hip adduction and abduction isolated Red tband HS curls Red tband ankle PF/DF 5# LAQ 5# marches Gait with HHA and w/c follow 110 feet short of breath  09/11/24 STM to the left upper trap and neck Nustep level 5 x 8 minutes Green tband left HS curls Green tband left  ankle PF/DF Worked on the w/c and then educated caregiver on how to lock the right leg rest back as it is not working properly but can be locked with an extra step Tried some right green tband row, extension and triceps, able to do about 6 of each due to pain in the elbow Gait with HHA and w/c follow 100 feet x 2, some increased cues needed today due to the left leg being placed out too far, had one toe drag.  09/02/24 BP 125/70 STM to the left upper trap and neck Nustep level 5 x 500 steps in 8 minutes Gait HHA and w/c follow 120 feet and then after a rest 100 feet some cues for left foot placement 5# left leg marches 5# left LAQ Left hip red tband adduction Left ankle PF/DF  08/19/24 NuStep L 5 x 9 min 500 step s green tband ankle PF/DF LLE  HS curls red 2x10  LAQ 3lb 2x15 Hip add ball squeeze Hip abduction green 2x15 Approximation L digits STM to the left upper trap and into the neck and rhomboid Gait with HHA and w/c follow 100 feet then a rest and 40 feet  08/14/24 Approximation L digits STM to the left upper trap and into the neck and rhomboid Nustep level 5 x 7:42 minutes 500 steps Blue tband ankle PF/DF LLE  HS Curls red 2x15 LAQ 3lb 2x15 Hip add ball squeeze Gait with HHA and W/C follow x 61feet    08/12/24 STM to the left upper trap and into the neck and rhomboid Approximation L digits Nustep level 5 x 9 minutes 3# LAQ 3# marches Blue tband ankle PF/DF Gait with HHA and W/C follow x50 feet and then x 60 feet, the left knee was buckling a little more today  08/07/24 STM to the left upper trap and into the neck and rhomboid Nustep level 5 x 9 minutes 3# LAQ 3# marches Blue tband hip abduction Blue tband ankle PF/DF Gait with HHA and W/C follow x100 feet and then x 50 feet, the left knee was buckling a little more today  08/05/24 STM to the left upper trap area Nustep level 5 x 8 minutes Seated partial sit ups Seated elbow touches to side 5# LAQ 5#  marches Blue tband clamshells Gait with HHA and w/c follow 120 feet then a rest and 60 feet  07/31/24 NuStep L 5 x 9 min STM to UT and cervical spine Approximation t0 L digits  Goals Red tband hip adduction, abduction LLE only  LAQ RLE 5lb 2x15 HS curls RLE red 2x15 Gait HHA and w/c follow 100 feet  07/29/24 Nustep level 5 x 8 minutes HS curls red 2x15 LAQ 5lb 2x15 Seated March 5lb 2x10  L ankle PR/DF red 2x10 Red tband hip adduction, abduction LLE only  Gait HHA and w/c follow 60 feet, 63ft, RLE externally rotated  07/22/24 Nustep level 5 x 8 minutes 5# LAQ and marches Red tband HS curl Red tband hip adduction, abduction Gait HHA and w/c follow 60 feet, RLE externally rotated  07/17/24 Nustep level 5 x 8 minutes STM to the left upper trap and neck area Gait 80 feet with HHA and w/c follow Red tband hip adduction, abduction Red tband HS curl Red tband ankle PF/DF 5# LAQ and marches Gait HHA and w/c follow 70 feet,   07/15/24 STM to UT and cervical spine L digit approximation NuStep L 5 x 8 min Goals  LAQ 3lb 2x10 Seated March 3lb 2x10 Hs curls green 2x10 STM to the left upper trap and neck Gait with HHA and w/c follow x 100 feet one seated rest    PATIENT EDUCATION:  Education details: POC Person educated: Patient Education method: Explanation Education comprehension: verbalized understanding  HOME EXERCISE PROGRAM: 06/10/24  Marches, LAQ, ankle PF/DF, left hip adduction and abduction  ASSESSMENT:  CLINICAL IMPRESSION:  Able to walk 120 feet, rest and then do 100 feet with HHA and w/c follow the last visit, today was 2x100 feet.   Cue not to lean back needed with LAQ.   Cues for full ROM needed with HS curls. Some fatigue noted with gait but able to increase distance.  She has improved greatly, her and her caregiver are trying to do more exercises at home the only issue is she does not feel safe with walking as it takes two people here one for HHA and one  for w/c follow.  I am looking to back her down to one time per week, and set up almost a maintenance type program due to her inability to walk at home, Us  walking with her here is a great benefit to her physically with the weight bearing and the exertion.   OBJECTIVE IMPAIRMENTS: Abnormal gait, cardiopulmonary status limiting activity, decreased activity tolerance, decreased balance, decreased coordination, decreased endurance, decreased mobility, difficulty walking,  decreased ROM, decreased strength, increased edema, impaired flexibility, improper body mechanics, postural dysfunction, and pain.   REHAB POTENTIAL: Good  CLINICAL DECISION MAKING: Stable/uncomplicated  EVALUATION COMPLEXITY: Low   GOALS: Goals reviewed with patient? Yes  SHORT TERM GOALS: Target date: 05/28/24 Independent with advanced HEP Goal status: progressing as she has one caregive that can do this 05/20/24  LONG TERM GOALS: Target date: 08/04/24  Independent with advanced HEP with caregiver Goal status: progressing 09/16/24  2.  Transfer with set up and CGA Goal status: will probably not meet this goal she needs Min A 07/15/24, Ongoing 07/31/24  this goal will not be met D/C this goal 09/02/24  3.  Walk HHA x 150 feet Goal status: Progressing 07/17/24 able to do 80 and 70 feet, cues needed for foot placement, Progressing 08/05/24 09/02/24  4.  Increase left LE strength to 4/5 Goal status: progressing 07/10/24, Met 07/31/24  5.  Report neck and shoulder pain decreased 50% Goal status: progressing 07/10/24, Progressing 20% 07/15/24  08/05/24 09/02/24 met 09/16/24  PLAN:  PT FREQUENCY: 2x/week  PT DURATION: 12 weeks  PLANNED INTERVENTIONS: Therapeutic exercises, Therapeutic activity, Neuromuscular re-education, Balance training, Gait training, Patient/Family education, Self Care, Joint mobilization, Moist heat, Taping, and Manual therapy  PLAN FOR NEXT SESSION:   will transition to a 1x/week maintenance program as she  does not have a caregiver that can walk with her. May see over this period and see what we can do with caregiver and walking   Viriginia Amendola W, PT 09/16/2024, 11:09 AM  "

## 2024-09-17 ENCOUNTER — Telehealth (HOSPITAL_BASED_OUTPATIENT_CLINIC_OR_DEPARTMENT_OTHER): Payer: Self-pay

## 2024-09-17 MED ORDER — INCRUSE ELLIPTA 62.5 MCG/ACT IN AEPB: 1.0000 | INHALATION_SPRAY | Freq: Every day | RESPIRATORY_TRACT | 5 refills | Status: AC

## 2024-09-17 MED ORDER — LEVALBUTEROL TARTRATE 45 MCG/ACT IN AERO: 2.0000 | INHALATION_SPRAY | RESPIRATORY_TRACT | 2 refills | Status: AC | PRN

## 2024-09-17 NOTE — Telephone Encounter (Signed)
 Rx sent to pharmacy   Copied from CRM [id number]. Topic: Clinical - Prescription Issue >> Sep 17, 2024  9:19 AM Leila BROCKS wrote: Reason for CRM: Patient [phone] states [facility] pharmacy does not have medications: levalbuterol  (XOPENEX  HFA) 45 MCG/ACT inhaler and umeclidinium bromide  (INCRUSE ELLIPTA ) 62.5 MCG/ACT AEPB  rom office visit with PA, Charley 09/15/24. Patient states is not having any symptoms right now, but asking for medications to be sent. Please call back if needed.   [id number] - [place], [place] - [address] AT [facility] OF [address] & [address] [place] [address] Phone: [phone] Fax: [phone]

## 2024-09-23 ENCOUNTER — Ambulatory Visit: Admitting: Physical Therapy

## 2024-09-30 ENCOUNTER — Ambulatory Visit: Admitting: Physical Therapy

## 2024-10-07 ENCOUNTER — Ambulatory Visit: Attending: Internal Medicine | Admitting: Physical Therapy

## 2024-10-14 ENCOUNTER — Ambulatory Visit: Admitting: Physical Therapy

## 2024-12-31 ENCOUNTER — Encounter (HOSPITAL_COMMUNITY)

## 2025-01-01 ENCOUNTER — Encounter (HOSPITAL_COMMUNITY)
